# Patient Record
Sex: Male | Born: 1950 | Race: White | Hispanic: No | Marital: Married | Smoking: Never smoker
Health system: Southern US, Community
[De-identification: ages and names within clinical notes are randomized; demographics above are authoritative.]

## PROBLEM LIST (undated history)

## (undated) DIAGNOSIS — I10 Essential (primary) hypertension: Secondary | ICD-10-CM

## (undated) DIAGNOSIS — F32A Depression, unspecified: Secondary | ICD-10-CM

## (undated) DIAGNOSIS — E875 Hyperkalemia: Secondary | ICD-10-CM

## (undated) DIAGNOSIS — J189 Pneumonia, unspecified organism: Secondary | ICD-10-CM

## (undated) DIAGNOSIS — C801 Malignant (primary) neoplasm, unspecified: Secondary | ICD-10-CM

## (undated) DIAGNOSIS — I77810 Thoracic aortic ectasia: Secondary | ICD-10-CM

## (undated) DIAGNOSIS — F0390 Unspecified dementia without behavioral disturbance: Secondary | ICD-10-CM

## (undated) DIAGNOSIS — E785 Hyperlipidemia, unspecified: Secondary | ICD-10-CM

## (undated) DIAGNOSIS — T7840XA Allergy, unspecified, initial encounter: Secondary | ICD-10-CM

## (undated) DIAGNOSIS — F329 Major depressive disorder, single episode, unspecified: Secondary | ICD-10-CM

## (undated) DIAGNOSIS — I509 Heart failure, unspecified: Secondary | ICD-10-CM

## (undated) DIAGNOSIS — G4733 Obstructive sleep apnea (adult) (pediatric): Secondary | ICD-10-CM

## (undated) DIAGNOSIS — I420 Dilated cardiomyopathy: Secondary | ICD-10-CM

## (undated) DIAGNOSIS — N400 Enlarged prostate without lower urinary tract symptoms: Secondary | ICD-10-CM

## (undated) HISTORY — DX: Depression, unspecified: F32.A

## (undated) HISTORY — PX: COLONOSCOPY: SHX174

## (undated) HISTORY — DX: Major depressive disorder, single episode, unspecified: F32.9

## (undated) HISTORY — DX: Malignant (primary) neoplasm, unspecified: C80.1

## (undated) HISTORY — DX: Hyperlipidemia, unspecified: E78.5

## (undated) HISTORY — DX: Thoracic aortic ectasia: I77.810

## (undated) HISTORY — DX: Dilated cardiomyopathy: I42.0

## (undated) HISTORY — DX: Hyperkalemia: E87.5

## (undated) HISTORY — DX: Allergy, unspecified, initial encounter: T78.40XA

## (undated) HISTORY — DX: Obstructive sleep apnea (adult) (pediatric): G47.33

---

## 2006-01-25 ENCOUNTER — Encounter: Admission: RE | Admit: 2006-01-25 | Discharge: 2006-01-25 | Payer: Self-pay | Admitting: Family Medicine

## 2007-03-15 ENCOUNTER — Encounter: Admission: RE | Admit: 2007-03-15 | Discharge: 2007-03-15 | Payer: Self-pay | Admitting: Family Medicine

## 2007-04-09 ENCOUNTER — Ambulatory Visit: Payer: Self-pay | Admitting: Internal Medicine

## 2007-05-10 ENCOUNTER — Encounter: Payer: Self-pay | Admitting: Internal Medicine

## 2007-05-10 DIAGNOSIS — Z85828 Personal history of other malignant neoplasm of skin: Secondary | ICD-10-CM | POA: Insufficient documentation

## 2007-05-10 DIAGNOSIS — F329 Major depressive disorder, single episode, unspecified: Secondary | ICD-10-CM

## 2007-05-10 DIAGNOSIS — J42 Unspecified chronic bronchitis: Secondary | ICD-10-CM | POA: Insufficient documentation

## 2007-05-10 DIAGNOSIS — F1010 Alcohol abuse, uncomplicated: Secondary | ICD-10-CM | POA: Insufficient documentation

## 2007-05-10 DIAGNOSIS — F32A Depression, unspecified: Secondary | ICD-10-CM | POA: Insufficient documentation

## 2007-05-10 DIAGNOSIS — J40 Bronchitis, not specified as acute or chronic: Secondary | ICD-10-CM | POA: Insufficient documentation

## 2007-05-10 DIAGNOSIS — F3130 Bipolar disorder, current episode depressed, mild or moderate severity, unspecified: Secondary | ICD-10-CM | POA: Insufficient documentation

## 2007-05-10 HISTORY — DX: Personal history of other malignant neoplasm of skin: Z85.828

## 2008-03-16 ENCOUNTER — Encounter: Admission: RE | Admit: 2008-03-16 | Discharge: 2008-03-16 | Payer: Self-pay | Admitting: Family Medicine

## 2010-08-14 ENCOUNTER — Encounter (INDEPENDENT_AMBULATORY_CARE_PROVIDER_SITE_OTHER): Payer: Self-pay | Admitting: *Deleted

## 2010-08-22 NOTE — Letter (Signed)
Summary: Pre Visit Letter Revised  [facility]  [address] [place], [place] [address]   Phone: [phone]  Fax: [phone]        08/14/2010 MRN: [id number] Calvin Escobar [address] [place], [place]  [address]             Procedure Date:  September 05, 2010   dir col-Dr Dalene Carrow to the Gastroenterology Division at [facility].    You are scheduled to see a nurse for your pre-procedure visit on August 23, 2010 at 4:30pm on the 3rd floor at [facility], [address]  We ask that you try to arrive at our office 15 minutes prior to your appointment time to allow for check-in.  Please take a minute to review the attached form.  If you answer "Yes" to one or more of the questions on the first page, we ask that you call the person listed at your earliest opportunity.  If you answer "No" to all of the questions, please complete the rest of the form and bring it to your appointment.    Your nurse visit will consist of discussing your medical and surgical history, your immediate family medical history, and your medications.   If you are unable to list all of your medications on the form, please bring the medication bottles to your appointment and we will list them.  We will need to be aware of both prescribed and over the counter drugs.  We will need to know exact dosage information as well.    Please be prepared to read and sign documents such as consent forms, a financial agreement, and acknowledgement forms.  If necessary, and with your consent, a friend or relative is welcome to sit-in on the nurse visit with you.  Please bring your insurance card so that we may make a copy of it.  If your insurance requires a referral to see a specialist, please bring your referral form from your primary care physician.  No co-pay is required for this nurse visit.     If you cannot keep your appointment, please call [phone] to cancel or reschedule prior to your  appointment date.  This allows [place] the opportunity to schedule an appointment for another patient in need of care.    Thank you for choosing [facility] for your medical needs.  We appreciate the opportunity to care for you.  Please visit [place] at our website  to learn more about our practice.  Sincerely, The Gastroenterology Division

## 2010-08-23 ENCOUNTER — Ambulatory Visit (AMBULATORY_SURGERY_CENTER): Payer: Managed Care, Other (non HMO) | Admitting: *Deleted

## 2010-08-23 VITALS — Ht 73.0 in | Wt 297.0 lb

## 2010-08-23 DIAGNOSIS — Z1211 Encounter for screening for malignant neoplasm of colon: Secondary | ICD-10-CM

## 2010-08-23 MED ORDER — PEG-KCL-NACL-NASULF-NA ASC-C 100 G PO SOLR: 1.0000 | Freq: Once | ORAL | Status: DC

## 2010-08-23 MED ORDER — PEG-KCL-NACL-NASULF-NA ASC-C 100 G PO SOLR: 1.0000 | Freq: Once | ORAL | Status: AC

## 2010-08-24 ENCOUNTER — Telehealth: Payer: Self-pay | Admitting: Internal Medicine

## 2010-08-24 MED ORDER — PEG-KCL-NACL-NASULF-NA ASC-C 100 G PO SOLR: 1.0000 | Freq: Once | ORAL | Status: AC

## 2010-08-24 NOTE — Telephone Encounter (Signed)
LEFT MESSAGE FOR "Calvin Escobar" LETTING HIM KNOW THAT HIS MOVIPREP RX WAS RESENT TO HIS PHARMACY.

## 2010-09-05 ENCOUNTER — Other Ambulatory Visit: Payer: Self-pay | Admitting: Internal Medicine

## 2010-09-15 ENCOUNTER — Encounter: Payer: Self-pay | Admitting: Internal Medicine

## 2010-09-15 ENCOUNTER — Ambulatory Visit (AMBULATORY_SURGERY_CENTER): Payer: Managed Care, Other (non HMO) | Admitting: Internal Medicine

## 2010-09-15 VITALS — BP 129/88 | HR 57 | Temp 97.1°F | Resp 19 | Ht 70.0 in | Wt 290.0 lb

## 2010-09-15 DIAGNOSIS — K5730 Diverticulosis of large intestine without perforation or abscess without bleeding: Secondary | ICD-10-CM

## 2010-09-15 DIAGNOSIS — Z1211 Encounter for screening for malignant neoplasm of colon: Secondary | ICD-10-CM

## 2010-09-15 MED ORDER — SODIUM CHLORIDE 0.9 % IV SOLN: 500.0000 mL | INTRAVENOUS | Status: DC

## 2010-09-15 NOTE — Patient Instructions (Signed)
Please read the handouts given to you in the recovery room.    Do increase your fiber intake due to your diverticulosis.   If you have any questions or concerns,  Please call us at [phone].  Thank-you.

## 2010-09-18 ENCOUNTER — Telehealth: Payer: Self-pay | Admitting: *Deleted

## 2010-09-18 NOTE — Telephone Encounter (Signed)
No answer,did not leave message,no i.d. On machine.

## 2010-10-17 NOTE — Letter (Signed)
April 09, 2007    Calvin Escobar, M.D.  [address]  [place], [place] [address]   RE:  Calvin Escobar, Calvin Escobar  MRN:  [id number]  /  DOB:  06/02/1951   Dear Dr. Kathrynn Escobar:   It was a pleasure to see [name] in our pulmonary clinic today.  As you know, he is a very pleasant 60 year old male.  His history is  that he moved to [place] in 2002.  At that time, he had a regular job  at [employer], which is near [employer] in [place].  Preoperative  physical evaluation showed a nodule on the chest x-ray.  He thinks he  was denied a job because of that.  Later, when he regained his  insurance, he saw you in August 2007.  A CT scan of the chest was done  and the right upper lobe nodule was seen (details discussed below).  This was followed up with a CT scan again in October of 2008.  He now  presents here for further assessment and evaluation of the nodule.  Apart from being overweight he feels he is in stable health and is  asymptomatic.   PAST MEDICAL HISTORY:  1. Episodes of bronchitis in the winter, for which he takes      antibiotics.  2. Facial skin cancer, not otherwise specified.  He is being followed      by a dermatologist every year.  3. Depression.  4. Ex-alcoholic.  5. Denies exposure to tuberculosis.   PAST SURGICAL HISTORY:  None.   ALLERGIES:  None.   CURRENT MEDICATIONS:  Doxazosin.  Trazodone.  and another unknown  medicine   SOCIAL HISTORY:  He grew up in [place] and lived there until 1991,  around [place] and [place].  In 1999, he moved to [place],  [place] where he lived until 2001, when he worked for [employer] of the  [employer].  He [place] and was exposed to a lot  of soil dust.  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  He denies  smoking.  Used to drink a lot, but now quit.  He is married and has  children.   EXPOSURE HISTORY:  He is  exposed to the inks at work, but thinks they  are safe.  He was exposed to a lot of soil dust in [place] between  1999 and 2001.   FAMILY HISTORY:  His sister had bone cancer.  Otherwise,  noncontributory.   REVIEW OF SYSTEMS:  Positive for weight gain, anxiety, and depression.  Otherwise, noncontributory.   PHYSICAL EXAM:  Weight 280 pounds, temperature 98.3, blood pressure  126/78, pulse 70, saturation 96% on room air.  GENERAL:  Obese male seated comfortably in the exam room.  CENTRAL NERVOUS SYSTEM:  Alert and oriented x3.  Speech and ambulation  normal.  HEENT:  Mallampati III to IV.  No neck nodes.  Supple neck.  RESPIRATORY:  Air entry equal.  Normal breath sounds.  CARDIOVASCULAR:  Normal heart sounds.  Regular rate and rhythm.  ABDOMEN:  Obese, soft, and nontender.  EXTREMITIES:  No cyanosis, clubbing, or edema.  SKIN:  He has ink from work on his fingers.  BACK:  Normal.  MUSCULOSKELETAL:  No joint deformities.   LABORATORY DATA:  CT scan of the chest on January 22, 2006 performed in  [facility].  I  personally reviewed this film.  It shows right  upper lobe approximately 1 cm nodule with a central calcification and  surrounding soft tissue density.  There are no other abnormalities  noted.   Followup CT scan on March 15, 2007.  I personally reviewed this film  as well.  It shows complete calcification of the nodule, which is 10x6  cm in size on image 22.  In addition, there are several 1 mm nodules  around the central calcification, and also on the anterior segment of  the right upper lobe.  These small micro-nodules are new.   IMPRESSION:  1. Right upper lobe 1 cm calcified nodules.  In August 2007, this      nodule had a central calcification.  By October 2008, the      calcification is complete.  Based on the fact that he is a      nonsmoker, lived in the [place], and the fact that this      is a completely calcified nodule that is  unchanged in size in 1      year, pre-test probability for malignancy is zero on [email or web address]      calculator and also clinically.  Therefore, there is no need to      follow this.   1. New micro-nodules.  I am not sure what these new micro-nodules are.      They could be related to granulomatous disease.  The previous      probability for this being malignant is less than 5%.  Never the      less, it requires a followup.  CT scan of the chest will be      repeated again in November 2009.   Return to clinic in 1 year with CT scan of the chest.    Sincerely,      Calvin Shan, MD  Electronically Signed    MR/MedQ  DD: 04/09/2007  DT: 04/10/2007  Job #: [id number]

## 2012-03-27 DIAGNOSIS — N400 Enlarged prostate without lower urinary tract symptoms: Secondary | ICD-10-CM | POA: Insufficient documentation

## 2012-03-27 DIAGNOSIS — N138 Other obstructive and reflux uropathy: Secondary | ICD-10-CM | POA: Insufficient documentation

## 2012-03-27 DIAGNOSIS — E669 Obesity, unspecified: Secondary | ICD-10-CM | POA: Insufficient documentation

## 2012-03-27 HISTORY — DX: Morbid (severe) obesity due to excess calories: E66.01

## 2012-09-18 DIAGNOSIS — F1021 Alcohol dependence, in remission: Secondary | ICD-10-CM | POA: Insufficient documentation

## 2013-09-12 ENCOUNTER — Emergency Department (HOSPITAL_BASED_OUTPATIENT_CLINIC_OR_DEPARTMENT_OTHER)
Admission: EM | Admit: 2013-09-12 | Discharge: 2013-09-12 | Disposition: A | Discharge status: Home / Self Care | Payer: Managed Care, Other (non HMO) | Attending: Emergency Medicine | Admitting: Emergency Medicine

## 2013-09-12 ENCOUNTER — Encounter (HOSPITAL_BASED_OUTPATIENT_CLINIC_OR_DEPARTMENT_OTHER): Payer: Self-pay | Admitting: Emergency Medicine

## 2013-09-12 ENCOUNTER — Emergency Department (HOSPITAL_BASED_OUTPATIENT_CLINIC_OR_DEPARTMENT_OTHER): Payer: Managed Care, Other (non HMO)

## 2013-09-12 DIAGNOSIS — Z9889 Other specified postprocedural states: Secondary | ICD-10-CM | POA: Insufficient documentation

## 2013-09-12 DIAGNOSIS — R5381 Other malaise: Secondary | ICD-10-CM | POA: Insufficient documentation

## 2013-09-12 DIAGNOSIS — K5900 Constipation, unspecified: Secondary | ICD-10-CM | POA: Insufficient documentation

## 2013-09-12 DIAGNOSIS — R5383 Other fatigue: Secondary | ICD-10-CM

## 2013-09-12 DIAGNOSIS — Z79899 Other long term (current) drug therapy: Secondary | ICD-10-CM | POA: Insufficient documentation

## 2013-09-12 DIAGNOSIS — Z859 Personal history of malignant neoplasm, unspecified: Secondary | ICD-10-CM | POA: Insufficient documentation

## 2013-09-12 DIAGNOSIS — F3289 Other specified depressive episodes: Secondary | ICD-10-CM | POA: Insufficient documentation

## 2013-09-12 DIAGNOSIS — R112 Nausea with vomiting, unspecified: Secondary | ICD-10-CM | POA: Insufficient documentation

## 2013-09-12 DIAGNOSIS — F329 Major depressive disorder, single episode, unspecified: Secondary | ICD-10-CM | POA: Insufficient documentation

## 2013-09-12 DIAGNOSIS — R630 Anorexia: Secondary | ICD-10-CM | POA: Insufficient documentation

## 2013-09-12 DIAGNOSIS — R109 Unspecified abdominal pain: Secondary | ICD-10-CM

## 2013-09-12 DIAGNOSIS — R1084 Generalized abdominal pain: Secondary | ICD-10-CM | POA: Insufficient documentation

## 2013-09-12 DIAGNOSIS — F1010 Alcohol abuse, uncomplicated: Secondary | ICD-10-CM | POA: Insufficient documentation

## 2013-09-12 DIAGNOSIS — E785 Hyperlipidemia, unspecified: Secondary | ICD-10-CM | POA: Insufficient documentation

## 2013-09-12 LAB — URINALYSIS, ROUTINE W REFLEX MICROSCOPIC
Bilirubin Urine: NEGATIVE
Glucose, UA: NEGATIVE mg/dL
Ketones, ur: NEGATIVE mg/dL
Leukocytes, UA: NEGATIVE
Nitrite: NEGATIVE
Protein, ur: 100 mg/dL — AB
Specific Gravity, Urine: 1.021 (ref 1.005–1.030)
Urobilinogen, UA: 0.2 mg/dL (ref 0.0–1.0)
pH: 5.5 (ref 5.0–8.0)

## 2013-09-12 LAB — URINE MICROSCOPIC-ADD ON

## 2013-09-12 LAB — COMPREHENSIVE METABOLIC PANEL
ALT: 18 U/L (ref 0–53)
AST: 24 U/L (ref 0–37)
Albumin: 3.6 g/dL (ref 3.5–5.2)
Alkaline Phosphatase: 64 U/L (ref 39–117)
CO2: 27 mEq/L (ref 19–32)
Calcium: 9.3 mg/dL (ref 8.4–10.5)
GFR calc Af Amer: 72 mL/min — ABNORMAL LOW (ref >90)
GFR calc non Af Amer: 62 mL/min — ABNORMAL LOW (ref >90)
Potassium: 4.1 mEq/L (ref 3.7–5.3)
Sodium: 138 mEq/L (ref 137–147)

## 2013-09-12 LAB — CBC WITH DIFFERENTIAL/PLATELET
Band Neutrophils: 5 % (ref 0–10)
Basophils Absolute: 0 K/uL (ref 0.0–0.1)
Basophils Relative: 0 % (ref 0–1)
Eosinophils Absolute: 0 K/uL (ref 0.0–0.7)
Eosinophils Relative: 0 % (ref 0–5)
HCT: 40.2 % (ref 39.0–52.0)
Hemoglobin: 13.5 g/dL (ref 13.0–17.0)
Lymphocytes Relative: 6 % — ABNORMAL LOW (ref 12–46)
Lymphs Abs: 0.8 K/uL (ref 0.7–4.0)
MCH: 31 pg (ref 26.0–34.0)
MCHC: 33.6 g/dL (ref 30.0–36.0)
MCV: 92.4 fL (ref 78.0–100.0)
Monocytes Absolute: 1 10*3/uL (ref 0.1–1.0)
Monocytes Relative: 8 % (ref 3–12)
Neutro Abs: 10.9 10*3/uL — ABNORMAL HIGH (ref 1.7–7.7)
Neutrophils Relative %: 81 % — ABNORMAL HIGH (ref 43–77)
Platelets: 124 K/uL — ABNORMAL LOW (ref 150–400)
RBC: 4.35 MIL/uL (ref 4.22–5.81)
RDW: 13.1 % (ref 11.5–15.5)
WBC: 12.7 10*3/uL — ABNORMAL HIGH (ref 4.0–10.5)

## 2013-09-12 LAB — COMPREHENSIVE METABOLIC PANEL WITH GFR
BUN: 21 mg/dL (ref 6–23)
Chloride: 97 meq/L (ref 96–112)
Creatinine, Ser: 1.2 mg/dL (ref 0.50–1.35)
Glucose, Bld: 133 mg/dL — ABNORMAL HIGH (ref 70–99)
Total Bilirubin: 0.4 mg/dL (ref 0.3–1.2)
Total Protein: 7.6 g/dL (ref 6.0–8.3)

## 2013-09-12 LAB — LIPASE, BLOOD: Lipase: 27 U/L (ref 11–59)

## 2013-09-12 MED ORDER — OXYCODONE-ACETAMINOPHEN 5-325 MG PO TABS: 1.0000 | ORAL_TABLET | Freq: Four times a day (QID) | ORAL | Status: DC | PRN

## 2013-09-12 MED ORDER — ONDANSETRON HCL 4 MG/2ML IJ SOLN
4.0000 mg | Freq: Once | INTRAMUSCULAR | Status: AC
  Administered 2013-09-12: 4 mg via INTRAVENOUS
  Filled 2013-09-12: qty 2

## 2013-09-12 MED ORDER — FENTANYL CITRATE 0.05 MG/ML IJ SOLN
50.0000 ug | INTRAMUSCULAR | Status: DC | PRN
  Administered 2013-09-12: 50 ug via INTRAVENOUS
  Filled 2013-09-12: qty 2

## 2013-09-12 MED ORDER — ONDANSETRON HCL 4 MG PO TABS: 4.0000 mg | ORAL_TABLET | Freq: Four times a day (QID) | ORAL | Status: DC

## 2013-09-12 MED ORDER — FAMOTIDINE IN NACL 20-0.9 MG/50ML-% IV SOLN
20.0000 mg | Freq: Once | INTRAVENOUS | Status: AC
  Administered 2013-09-12: 20 mg via INTRAVENOUS
  Filled 2013-09-12: qty 50

## 2013-09-12 MED ORDER — IOHEXOL 300 MG/ML  SOLN
50.0000 mL | Freq: Once | INTRAMUSCULAR | Status: AC | PRN
  Administered 2013-09-12: 50 mL via ORAL

## 2013-09-12 MED ORDER — IOHEXOL 300 MG/ML  SOLN
100.0000 mL | Freq: Once | INTRAMUSCULAR | Status: AC | PRN
  Administered 2013-09-12: 100 mL via INTRAVENOUS

## 2013-09-12 NOTE — Discharge Instructions (Signed)
Return to the ED with any concerns including increased pain, vomiting and not able to keep down liquids, fever/chilsl, decreased level of alertness/lethargy, or any other alarming symptoms

## 2013-09-12 NOTE — ED Provider Notes (Signed)
CSN: [id number]     Arrival date & time 09/12/13  1215 History   First MD Initiated Contact with Patient 09/12/13 1310     Chief Complaint  Patient presents with  . Abdominal Pain     (Consider location/radiation/quality/duration/timing/severity/associated sxs/prior Treatment) HPI Comments: Pt has had N/V a couple of days ago initially, none further, but appetite is down, gets easily nasueated with eating or drinking.  Feels fatigued, no fevers, chills.  No dark stools, no blood in stool.  Went to PCP at [facility] and was sent to the ED due to elevated WBC.    Patient is a 63 y.o. male presenting with abdominal pain. The history is provided by the patient.  Abdominal Pain Pain location:  Generalized Pain quality: aching, bloating, fullness and pressure   Pain severity:  Moderate Onset quality:  Gradual Duration:  3 days Progression:  Worsening Chronicity:  New Context: alcohol use   Context: not previous surgeries, not recent illness, not recent travel, not sick contacts and not suspicious food intake   Relieved by:  Nothing Worsened by:  Palpation Ineffective treatments:  None tried Associated symptoms: anorexia, constipation, nausea and vomiting   Associated symptoms: no belching, no chest pain, no chills, no cough, no diarrhea, no fever and no shortness of breath     Past Medical History  Diagnosis Date  . Allergy seasonal  . Depression   . Cancer skin  . Hyperlipidemia    Past Surgical History  Procedure Laterality Date  . Colonoscopy     History reviewed. No pertinent family history. History  Substance Use Topics  . Smoking status: Never Smoker   . Smokeless tobacco: Not on file  . Alcohol Use: 8.4 oz/week    14 Cans of beer per week    Review of Systems  Constitutional: Negative for fever and chills.  Respiratory: Negative for cough and shortness of breath.   Cardiovascular: Negative for chest pain.  Gastrointestinal: Positive for nausea, vomiting,  abdominal pain, constipation and anorexia. Negative for diarrhea and blood in stool.  Genitourinary: Negative for flank pain and testicular pain.  Musculoskeletal: Negative for back pain.  All other systems reviewed and are negative.     Allergies  Review of patient's allergies indicates no known allergies.  Home Medications   Current Outpatient Rx  Name  Route  Sig  Dispense  Refill  . calcium carbonate (OS-CAL) 600 MG TABS   Oral   Take 600 mg by mouth daily.           Marland Kitchen doxazosin (CARDURA) 8 MG tablet   Oral   Take 8 mg by mouth at bedtime. BPH          . fish oil-omega-3 fatty acids 1000 MG capsule   Oral   Take 4 g by mouth daily.           . Multiple Vitamins-Minerals (MULTIVITAMIN WITH MINERALS) tablet   Oral   Take 1 tablet by mouth daily.           Marland Kitchen OLANZapine (ZYPREXA) 5 MG tablet   Oral   Take 5 mg by mouth at bedtime.           . sertraline (ZOLOFT) 100 MG tablet   Oral   Take 100 mg by mouth daily. 2 tablets daily          . Testosterone (AXIRON) 30 MG/ACT SOLN   Transdermal   Place 60 Act onto the skin 2 (two) times a week.           Marland Kitchen  traZODone (DESYREL) 150 MG tablet   Oral   Take 150 mg by mouth at bedtime.           Marland Kitchen zolpidem (AMBIEN) 10 MG tablet   Oral   Take 10 mg by mouth at bedtime as needed.            BP 136/87  Pulse 91  Temp(Src) 98.5 F (36.9 C) (Oral)  Resp 24  SpO2 95% Physical Exam  Nursing note and vitals reviewed. Constitutional: He appears well-developed and well-nourished. No distress.  HENT:  Head: Normocephalic and atraumatic.  Eyes: EOM are normal.  Neck: Normal range of motion. Neck supple.  Cardiovascular: Normal rate and intact distal pulses.   Pulmonary/Chest: Effort normal. No respiratory distress.  Abdominal: Soft. Normal appearance. He exhibits no distension and no mass. Bowel sounds are decreased. There is no tenderness. There is no rebound, no guarding and no CVA tenderness. No  hernia.    Neurological: He is alert. Coordination normal.  Skin: Skin is warm and dry. He is not diaphoretic.  Psychiatric: His behavior is normal. His affect is blunt.    ED Course  Procedures (including critical care time) Labs Review Labs Reviewed  CBC WITH DIFFERENTIAL - Abnormal; Notable for the following:    WBC 12.7 (*)    Platelets 124 (*)    Neutrophils Relative % 81 (*)    Lymphocytes Relative 6 (*)    Neutro Abs 10.9 (*)    All other components within normal limits  COMPREHENSIVE METABOLIC PANEL - Abnormal; Notable for the following:    Glucose, Bld 133 (*)    GFR calc non Af Amer 62 (*)    GFR calc Af Amer 72 (*)    All other components within normal limits  LIPASE, BLOOD  URINALYSIS, ROUTINE W REFLEX MICROSCOPIC   Imaging Review No results found.   EKG Interpretation None     RA sat is 95% and I interpret to be adequate    MDM   Final diagnoses:  Abdominal pain     Pt with persistent lower abd pain associated with N/V.  Abd is mildly tender, but soft.  No h/o prior surgeries.  Will give IVF's, IV antiemetics and pain meds, obtain CT of abd/eplvis.  Will sign out patient to Dr. Canary Brim.   Saddie Benders. Spenser Harren, MD 09/12/13 1513

## 2013-09-12 NOTE — ED Notes (Signed)
Pt having lower abdominal pain since Wednesday.  Pt having some weakness, some nausea, vomiting x one hour on Wednesday.  Last bm was on last night, small amount, formed brown stool, no diarrhea.

## 2014-10-13 DIAGNOSIS — F339 Major depressive disorder, recurrent, unspecified: Secondary | ICD-10-CM | POA: Insufficient documentation

## 2015-01-12 DIAGNOSIS — K279 Peptic ulcer, site unspecified, unspecified as acute or chronic, without hemorrhage or perforation: Secondary | ICD-10-CM

## 2015-01-12 HISTORY — DX: Peptic ulcer, site unspecified, unspecified as acute or chronic, without hemorrhage or perforation: K27.9

## 2016-01-29 ENCOUNTER — Emergency Department (HOSPITAL_COMMUNITY): Payer: Medicare HMO

## 2016-01-29 ENCOUNTER — Inpatient Hospital Stay (HOSPITAL_COMMUNITY)
Admission: EM | Admit: 2016-01-29 | Discharge: 2016-02-03 | DRG: 918 | Disposition: A | Discharge status: Skilled Nursing Facility | Payer: Medicare HMO | Attending: Student in an Organized Health Care Education/Training Program | Admitting: Student in an Organized Health Care Education/Training Program

## 2016-01-29 ENCOUNTER — Encounter (HOSPITAL_COMMUNITY): Payer: Self-pay

## 2016-01-29 DIAGNOSIS — R410 Disorientation, unspecified: Secondary | ICD-10-CM

## 2016-01-29 DIAGNOSIS — G252 Other specified forms of tremor: Secondary | ICD-10-CM

## 2016-01-29 DIAGNOSIS — F32A Depression, unspecified: Secondary | ICD-10-CM | POA: Diagnosis present

## 2016-01-29 DIAGNOSIS — T43591A Poisoning by other antipsychotics and neuroleptics, accidental (unintentional), initial encounter: Principal | ICD-10-CM | POA: Diagnosis present

## 2016-01-29 DIAGNOSIS — T56891A Toxic effect of other metals, accidental (unintentional), initial encounter: Secondary | ICD-10-CM | POA: Diagnosis present

## 2016-01-29 DIAGNOSIS — E876 Hypokalemia: Secondary | ICD-10-CM | POA: Diagnosis present

## 2016-01-29 DIAGNOSIS — F0390 Unspecified dementia without behavioral disturbance: Secondary | ICD-10-CM | POA: Insufficient documentation

## 2016-01-29 DIAGNOSIS — F329 Major depressive disorder, single episode, unspecified: Secondary | ICD-10-CM

## 2016-01-29 DIAGNOSIS — T56894A Toxic effect of other metals, undetermined, initial encounter: Secondary | ICD-10-CM

## 2016-01-29 DIAGNOSIS — E875 Hyperkalemia: Secondary | ICD-10-CM

## 2016-01-29 DIAGNOSIS — R7889 Finding of other specified substances, not normally found in blood: Secondary | ICD-10-CM

## 2016-01-29 DIAGNOSIS — F0391 Unspecified dementia with behavioral disturbance: Secondary | ICD-10-CM | POA: Diagnosis present

## 2016-01-29 DIAGNOSIS — Y92129 Unspecified place in nursing home as the place of occurrence of the external cause: Secondary | ICD-10-CM

## 2016-01-29 DIAGNOSIS — F3130 Bipolar disorder, current episode depressed, mild or moderate severity, unspecified: Secondary | ICD-10-CM | POA: Diagnosis present

## 2016-01-29 DIAGNOSIS — E785 Hyperlipidemia, unspecified: Secondary | ICD-10-CM | POA: Diagnosis present

## 2016-01-29 DIAGNOSIS — N400 Enlarged prostate without lower urinary tract symptoms: Secondary | ICD-10-CM | POA: Diagnosis present

## 2016-01-29 DIAGNOSIS — J302 Other seasonal allergic rhinitis: Secondary | ICD-10-CM | POA: Diagnosis present

## 2016-01-29 DIAGNOSIS — G253 Myoclonus: Secondary | ICD-10-CM | POA: Diagnosis present

## 2016-01-29 HISTORY — DX: Toxic effect of other metals, accidental (unintentional), initial encounter: T56.891A

## 2016-01-29 HISTORY — DX: Unspecified dementia, unspecified severity, without behavioral disturbance, psychotic disturbance, mood disturbance, and anxiety: F03.90

## 2016-01-29 LAB — CBC WITH DIFFERENTIAL/PLATELET
Basophils Absolute: 0 10*3/uL (ref 0.0–0.1)
Basophils Relative: 0 %
Eosinophils Absolute: 0.2 10*3/uL (ref 0.0–0.7)
Eosinophils Relative: 2 %
HEMATOCRIT: 38 % — AB (ref 39.0–52.0)
HEMOGLOBIN: 12.6 g/dL — AB (ref 13.0–17.0)
LYMPHS ABS: 1.9 10*3/uL (ref 0.7–4.0)
Lymphocytes Relative: 15 %
MCH: 30.1 pg (ref 26.0–34.0)
MCHC: 33.2 g/dL (ref 30.0–36.0)
MCV: 90.7 fL (ref 78.0–100.0)
MONOS PCT: 9 %
Monocytes Absolute: 1.1 10*3/uL — ABNORMAL HIGH (ref 0.1–1.0)
NEUTROS ABS: 9.7 10*3/uL — AB (ref 1.7–7.7)
NEUTROS PCT: 74 %
Platelets: 198 10*3/uL (ref 150–400)
RBC: 4.19 MIL/uL — AB (ref 4.22–5.81)
RDW: 12.7 % (ref 11.5–15.5)
WBC: 13 10*3/uL — ABNORMAL HIGH (ref 4.0–10.5)

## 2016-01-29 LAB — URINALYSIS, ROUTINE W REFLEX MICROSCOPIC
Bilirubin Urine: NEGATIVE
Glucose, UA: NEGATIVE mg/dL
Hgb urine dipstick: NEGATIVE
Ketones, ur: NEGATIVE mg/dL
Nitrite: NEGATIVE
PH: 6.5 (ref 5.0–8.0)
Protein, ur: NEGATIVE mg/dL
Specific Gravity, Urine: 1.009 (ref 1.005–1.030)

## 2016-01-29 LAB — TSH: TSH: 2.379 u[IU]/mL (ref 0.350–4.500)

## 2016-01-29 LAB — I-STAT TROPONIN, ED: Troponin i, poc: 0 ng/mL (ref 0.00–0.08)

## 2016-01-29 LAB — BASIC METABOLIC PANEL
Anion gap: 8 (ref 5–15)
BUN: 6 mg/dL (ref 6–20)
CHLORIDE: 100 mmol/L — AB (ref 101–111)
CO2: 24 mmol/L (ref 22–32)
CREATININE: 1.16 mg/dL (ref 0.61–1.24)
Calcium: 9.7 mg/dL (ref 8.9–10.3)
GFR calc Af Amer: >60 mL/min (ref >60)
GFR calc non Af Amer: >60 mL/min (ref >60)
Glucose, Bld: 94 mg/dL (ref 65–99)
POTASSIUM: 3.1 mmol/L — AB (ref 3.5–5.1)
Sodium: 132 mmol/L — ABNORMAL LOW (ref 135–145)

## 2016-01-29 LAB — URINE MICROSCOPIC-ADD ON: Squamous Epithelial / LPF: NONE SEEN

## 2016-01-29 LAB — LITHIUM LEVEL: Lithium Lvl: 2.37 mmol/L (ref 0.60–1.20)

## 2016-01-29 LAB — MAGNESIUM: MAGNESIUM: 2.2 mg/dL (ref 1.7–2.4)

## 2016-01-29 MED ORDER — POTASSIUM CHLORIDE 10 MEQ/100ML IV SOLN
10.0000 meq | INTRAVENOUS | Status: AC
  Administered 2016-01-29 (×4): 10 meq via INTRAVENOUS
  Filled 2016-01-29 (×4): qty 100

## 2016-01-29 MED ORDER — PAROXETINE HCL 20 MG PO TABS
60.0000 mg | ORAL_TABLET | Freq: Every day | ORAL | Status: DC
  Filled 2016-01-29: qty 3

## 2016-01-29 MED ORDER — ENSURE ENLIVE PO LIQD
237.0000 mL | Freq: Two times a day (BID) | ORAL | Status: DC
  Administered 2016-01-29 – 2016-02-03 (×8): 237 mL via ORAL

## 2016-01-29 MED ORDER — SERTRALINE HCL 50 MG PO TABS
200.0000 mg | ORAL_TABLET | Freq: Every day | ORAL | Status: DC
  Administered 2016-01-29: 200 mg via ORAL
  Filled 2016-01-29: qty 4

## 2016-01-29 MED ORDER — PAROXETINE HCL 20 MG PO TABS: 40.0000 mg | ORAL_TABLET | Freq: Every day | ORAL | Status: DC

## 2016-01-29 MED ORDER — KCL IN DEXTROSE-NACL 10-5-0.45 MEQ/L-%-% IV SOLN
INTRAVENOUS | Status: DC
  Administered 2016-01-29 (×2): via INTRAVENOUS
  Administered 2016-01-30: 125 mL/h via INTRAVENOUS
  Administered 2016-01-31 – 2016-02-01 (×4): via INTRAVENOUS
  Filled 2016-01-29 (×10): qty 1000

## 2016-01-29 MED ORDER — CALCIUM CARBONATE 1250 (500 CA) MG PO TABS
1250.0000 mg | ORAL_TABLET | Freq: Every day | ORAL | Status: DC
  Administered 2016-01-31 – 2016-02-03 (×4): 1250 mg via ORAL
  Filled 2016-01-29 (×4): qty 1

## 2016-01-29 MED ORDER — OLANZAPINE 5 MG PO TABS
5.0000 mg | ORAL_TABLET | Freq: Every day | ORAL | Status: DC
  Administered 2016-01-29: 5 mg via ORAL
  Filled 2016-01-29: qty 1

## 2016-01-29 MED ORDER — DOXAZOSIN MESYLATE 8 MG PO TABS
8.0000 mg | ORAL_TABLET | Freq: Every day | ORAL | Status: DC
  Administered 2016-01-29 – 2016-02-03 (×4): 8 mg via ORAL
  Filled 2016-01-29: qty 1
  Filled 2016-01-29 (×2): qty 4
  Filled 2016-01-29 (×2): qty 1
  Filled 2016-01-29 (×2): qty 4
  Filled 2016-01-29 (×3): qty 1

## 2016-01-29 MED ORDER — ENOXAPARIN SODIUM 40 MG/0.4ML ~~LOC~~ SOLN
40.0000 mg | SUBCUTANEOUS | Status: DC
  Administered 2016-01-29 – 2016-02-02 (×4): 40 mg via SUBCUTANEOUS
  Filled 2016-01-29 (×5): qty 0.4

## 2016-01-29 NOTE — H&P (Signed)
Date: 01/29/2016               Patient Name:  Calvin Escobar MRN: [id number]  DOB: Feb 27, 1951 Age / Sex: 65 y.o., male   PCP: Molli Barrows, MD         Medical Service: Internal Medicine Teaching Service         Attending Physician: Dr. Aldine Contes, MD    First Contact: Dr. Alphonzo Grieve Pager: [phone]  Second Contact: Dr. Dellia Nims Pager: [phone]       After Hours (After 5p/  First Contact Pager: [phone]  weekends / holidays): Second Contact Pager: [phone]   Chief Complaint: Worsening confusion  History of Present Illness: Calvin Escobar is a 65yo male with PMH of hyperlipidemia, depression presenting with worsening confusion and tremors. Patient is oriented only to self and is unable to answer questions pertaining to his condition and why he is in the hospital.   Per wife: Patient at baseline is alert and aware of himself and surroundings, and has mild tremors. Has had two days of worsening memory, confusion, and increased tremors to the point of not being stable enough to walk. He had a long history of alcohol abuse, which he stopped one year ago after his friend died. He also has a long history of depression, previously managed with zoloft alone but was taken off of it for an unknown reason to the wife. Wife is unable to specify which mental illness he has or why he is on so many psychiatric medications. She states that he has a lot of trouble sleeping. His psychiatrist increased his lithium dose about one month ago. She states that other than a couple of episodes of diarrhea a couple of days ago, he has been without symptoms or complaints.   Labs on admission: K 3.1 otherwise unremarkable BMP; Mg 2.2; WBL 13, Hgb 12.6, HCT 38.0, Plt 198 with unremarkable differential; Lithium level 2.37; CT head shows no acute infarct or process, evidence of chronic microvascular disease and atrophy. TSH 2.379 wnl.  Meds:  Current Meds  Medication Sig  . ALPRAZolam (XANAX) 0.25 MG tablet  Take 0.25 mg by mouth every morning.   . lithium carbonate 300 MG capsule Take 900 mg by mouth at bedtime.  Marland Kitchen PARoxetine (PAXIL) 40 MG tablet Take 40 mg by mouth at bedtime.  . sertraline (ZOLOFT) 100 MG tablet Take 200 mg by mouth daily.      Allergies: Allergies as of 01/29/2016  . (No Known Allergies)   Past Medical History:  Diagnosis Date  . Allergy seasonal  . Cancer ([place]) skin  . Dementia   . Depression   . Hyperlipidemia     Family History: From chart: none  Social History: From chart: no tobacco, no drug use; past alcohol abuse  Review of Systems: A complete ROS was negative except as per HPI.   Physical Exam: Blood pressure 116/74, pulse 76, temperature 99 F (37.2 C), temperature source Oral, resp. rate 18, height 6' (1.829 m), weight 127 kg (280 lb), SpO2 100 %.  Constitutional: NAD, lying in bed comfortably Neuro: Alert to self, not to situation or time, able to follow simple commands after a couple of repetitions, strength intact bilaterally, CN 2-12 grossly intact (unable to complete full exam). 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 Lithium level of 2.37 (theraputic goal of 0.6-1.2). Likely acute on chronic in setting of recent diarrhea. Kidney function remains at baseline (Cr 1.16). TSH wnl. CT head shows only chronic microvascular changes and atrophy. Will check  RPR, HIV and B12 levels in AM. --Hold lithium --IVF D5 1/2 NS @125  --monitor  Depression: Patient with long history of depression on multiple psychiatric medications and recently started lithium; will hold sedating meds as able (ambien, trazodone, percocet, xanex, Paxil). Per wife, he has not expressed SI/HI recently. --Continue Zoloft 200mg  daily; Olanzapine 5mg  daily.  Hypokalemia: Patient present with K of 3.1 and Magnesium wnl. --monitor --replace with KCl 17mEq IV x4; check AM BMP  Dispo: Admit patient to Observation with expected length of stay less than 2 midnights.  Signed: Alphonzo Grieve, MD 01/29/2016, 1:50 PM  Pager: [phone]

## 2016-01-29 NOTE — ED Notes (Signed)
Report to [facility] RN 6E

## 2016-01-29 NOTE — ED Triage Notes (Signed)
Patient arrived by [facility] from home with increased dementia, confusion per spouse and increased tremors for the past few months. On arrival patient alert but not oriented to place, situation.  Able to report his name and birth date. Denies trauma, no signs of trauma noted on assessment. Denies pain

## 2016-01-29 NOTE — ED Notes (Signed)
MD at bedside. 

## 2016-01-29 NOTE — ED Provider Notes (Signed)
Patient presently alert and in no distress. Complains of general malaise but states he is hungry. Patient appears tremulous, chronically ill-appearing   Calvin Dakin, MD 01/29/16 1302

## 2016-01-29 NOTE — ED Provider Notes (Signed)
[facility] DEPT Provider Note   CSN: [id number] Arrival date & time: 01/29/16  1025     History   Chief Complaint Chief Complaint  Patient presents with  . Tremors    HPI Calvin Escobar is a 65 y.o. male.  The history is provided by the patient and medical records.    Level V caveat: Dementia Patient is a 65 year old male with history of seasonal allergies, dementia, depression, hyperlipidemia, BPH, chronic tremors, mood disorder, presenting to the ED for possible change in mental status with increased tremors. At baseline patient is oriented to self. He lives at home with his wife who generally cares for him. She reports that he seems to have increase in tremors from his baseline and seems more confused than normal. Patient has not had any recent falls or trauma. He has not had any fever or other illness. No known changes in medications recently. Patient states overall he feels well.  Past Medical History:  Diagnosis Date  . Allergy seasonal  . Cancer ([place]) skin  . Dementia   . Depression   . Hyperlipidemia     Patient Active Problem List   Diagnosis Date Noted  . 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 DEPRESSION 05/10/2007  . BRONCHITIS 05/10/2007  . SKIN CANCER, HX OF 05/10/2007    Past Surgical History:  Procedure Laterality Date  . COLONOSCOPY         Home Medications    Prior to Admission medications   Medication Sig Start Date End Date Taking? Authorizing Provider  calcium carbonate (OS-CAL) 600 MG TABS Take 600 mg by mouth daily.      Historical Provider, MD  doxazosin (CARDURA) 8 MG tablet Take 8 mg by mouth at bedtime. BPH     Historical Provider, MD  fish oil-omega-3 fatty acids 1000 MG capsule Take 4 g by mouth daily.      Historical Provider, MD  Multiple Vitamins-Minerals (MULTIVITAMIN WITH MINERALS) tablet Take 1 tablet by mouth daily.      Historical Provider, MD  OLANZapine (ZYPREXA) 5 MG tablet Take 5 mg by mouth at bedtime.      Historical Provider, MD   ondansetron (ZOFRAN) 4 MG tablet Take 1 tablet (4 mg total) by mouth every 6 (six) hours. 09/12/13   Alfonzo Beers, MD  oxyCODONE-acetaminophen (PERCOCET/ROXICET) 5-325 MG per tablet Take 1-2 tablets by mouth every 6 (six) hours as needed for severe pain. 09/12/13   Alfonzo Beers, MD  sertraline (ZOLOFT) 100 MG tablet Take 100 mg by mouth daily. 2 tablets daily     Historical Provider, MD  Testosterone (AXIRON) 30 MG/ACT SOLN Place 60 Act onto the skin 2 (two) times a week.      Historical Provider, MD  traZODone (DESYREL) 150 MG tablet Take 150 mg by mouth at bedtime.      Historical Provider, MD  zolpidem (AMBIEN) 10 MG tablet Take 10 mg by mouth at bedtime as needed.      Historical Provider, MD    Family History No family history on file.  Social History Social History  Substance Use Topics  . Smoking status: Never Smoker  . Smokeless tobacco: Never Used  . Alcohol use 8.4 oz/week    14 Cans of beer per week     Allergies   Review of patient's allergies indicates no known allergies.   Review of Systems Review of Systems  Unable to perform ROS: Dementia     Physical Exam Updated Vital Signs BP 153/79   Pulse  71   Temp 99 F (37.2 C) (Oral)   Resp 18   Ht 6' (1.829 m)   Wt 127 kg   SpO2 94%   BMI 37.97 kg/m   Physical Exam  Constitutional: He appears well-developed and well-nourished.  HENT:  Head: Normocephalic and atraumatic.  Mouth/Throat: Oropharynx is clear and moist.  Dried food noted around mouth, membranes mildly dry  Eyes: Conjunctivae and EOM are normal. Pupils are equal, round, and reactive to light.  Neck: Normal range of motion and full passive range of motion without pain. Neck supple. No neck rigidity.  Cardiovascular: Normal rate, regular rhythm and normal heart sounds.   Pulmonary/Chest: Effort normal and breath sounds normal.  Abdominal: Soft. Bowel sounds are normal.  Musculoskeletal: Normal range of motion.  Pelvis stable, non-tender, no  leg shortening  Neurological: He is alert.  Alert, oriented to self; able to answer most questions and follow some commands; no tremors noted in the legs, tremors noted of the hands with outstretched arms  Skin: Skin is warm and dry.  Psychiatric: He has a normal mood and affect.  Nursing note and vitals reviewed.    ED Treatments / Results  Labs (all labs ordered are listed, but only abnormal results are displayed) Labs Reviewed  CBC WITH DIFFERENTIAL/PLATELET - Abnormal; Notable for the following:       Result Value   WBC 13.0 (*)    RBC 4.19 (*)    Hemoglobin 12.6 (*)    HCT 38.0 (*)    Neutro Abs 9.7 (*)    Monocytes Absolute 1.1 (*)    All other components within normal limits  BASIC METABOLIC PANEL - Abnormal; Notable for the following:    Sodium 132 (*)    Potassium 3.1 (*)    Chloride 100 (*)    All other components within normal limits  LITHIUM LEVEL - Abnormal; Notable for the following:    Lithium Lvl 2.37 (*)    All other components within normal limits  URINALYSIS, ROUTINE W REFLEX MICROSCOPIC (NOT AT [facility])  TSH  MAGNESIUM  I-STAT TROPOININ, ED    EKG  EKG Interpretation  Date/Time:  Sunday January 29 2016 10:35:59 EDT Ventricular Rate:  69 PR Interval:    QRS Duration: 132 QT Interval:  426 QTC Calculation: 457 R Axis:   -44 Text Interpretation:  Sinus rhythm Nonspecific IVCD with LAD Left ventricular hypertrophy ST elevation, consider inferior injury Baseline wander in lead(s) V1 No old tracing to compare Confirmed by Winfred Leeds  MD, SAM [phone]) on 01/29/2016 11:04:07 AM       Radiology Dg Chest 2 View  Result Date: 01/29/2016 CLINICAL DATA:  Change in mental status. EXAM: CHEST  2 VIEW COMPARISON:  Chest CT 03/16/2008 FINDINGS: Low lung volumes. Calcified granuloma in the right mid lung. Heart is borderline in size, accentuated by the low volumes. Left base atelectasis. No visible effusions. No acute bony abnormality. IMPRESSION: Low lung volumes.   Left base atelectasis. Borderline heart size. Electronically Signed   By: Rolm Baptise M.D.   On: 01/29/2016 11:59   Ct Head Wo Contrast  Result Date: 01/29/2016 CLINICAL DATA:  Increased dementia.  Confusion and tremors. EXAM: CT HEAD WITHOUT CONTRAST TECHNIQUE: Contiguous axial images were obtained from the base of the skull through the vertex without intravenous contrast. COMPARISON:  None. FINDINGS: Brain: There is prominence of the sulci and ventricles compatible with age related brain atrophy. Mild diffuse low attenuation within the subcortical and periventricular white matter  is identified. There is no abnormal extra-axial fluid collection, intracranial hemorrhage or mass noted. Vascular: No hyperdense vessel or unexpected calcification. Skull: The osseous skull is intact. Sinuses/Orbits: The paranasal sinuses appear clear. The mastoid air cells are clear. The calvarium appears intact. Other: None IMPRESSION: No acute intracranial abnormalities. Chronic microvascular disease and brain atrophy. Electronically Signed   By: Kerby Moors M.D.   On: 01/29/2016 12:34    Procedures Procedures (including critical care time)  Medications Ordered in ED Medications - No data to display   Initial Impression / Assessment and Plan / ED Course  I have reviewed the triage vital signs and the nursing notes.  Pertinent labs & imaging results that were available during my care of the patient were reviewed by me and considered in my medical decision making (see chart for details).  Clinical Course   65 y.o. M here with increased confusion and tremors.  Hx of this at baseline but worse than normal.  No recent fever, chills, or other illness.  No falls or syncope.  VSS here.  EKG is non-ischemic.  Patient is on lithium, question of toxicity?  Wife not here in ED for additional information, will try to speak with her.  CT head, CXR, and labs pending.  Patient's lithium level is high at 2.37. He does have a  mild hypokalemia at 3.1 as well. Chest x-ray and CT head negative for acute findings. Suspect his symptoms are due to lithium toxicity. I have spoken with patient's wife-- lithium dose recently increased by patient's psychiatrist and sleep  medications have been discontinued due to morning somnolence. Suspect this is the etiology of his toxicity. Patient will need admission for close monitoring.  Final Clinical Impressions(s) / ED Diagnoses   Final diagnoses:  Lithium toxicity, undetermined intent, initial encounter  Hypokalemia  Dementia, with behavioral disturbance  Hyperlipidemia    New Prescriptions New Prescriptions   No medications on file     Larene Pickett, PA-C 01/29/16 [place], MD 01/29/16 1701

## 2016-01-29 NOTE — ED Notes (Signed)
Lattie Haw PA at [facility]

## 2016-01-30 DIAGNOSIS — N400 Enlarged prostate without lower urinary tract symptoms: Secondary | ICD-10-CM | POA: Diagnosis present

## 2016-01-30 DIAGNOSIS — F0390 Unspecified dementia without behavioral disturbance: Secondary | ICD-10-CM

## 2016-01-30 DIAGNOSIS — G252 Other specified forms of tremor: Secondary | ICD-10-CM | POA: Diagnosis present

## 2016-01-30 DIAGNOSIS — R251 Tremor, unspecified: Secondary | ICD-10-CM | POA: Diagnosis not present

## 2016-01-30 DIAGNOSIS — E876 Hypokalemia: Secondary | ICD-10-CM | POA: Diagnosis present

## 2016-01-30 DIAGNOSIS — J302 Other seasonal allergic rhinitis: Secondary | ICD-10-CM | POA: Diagnosis present

## 2016-01-30 DIAGNOSIS — T56891D Toxic effect of other metals, accidental (unintentional), subsequent encounter: Secondary | ICD-10-CM | POA: Diagnosis not present

## 2016-01-30 DIAGNOSIS — R410 Disorientation, unspecified: Secondary | ICD-10-CM | POA: Diagnosis not present

## 2016-01-30 DIAGNOSIS — F0391 Unspecified dementia with behavioral disturbance: Secondary | ICD-10-CM | POA: Diagnosis present

## 2016-01-30 DIAGNOSIS — E785 Hyperlipidemia, unspecified: Secondary | ICD-10-CM | POA: Diagnosis present

## 2016-01-30 DIAGNOSIS — Y92129 Unspecified place in nursing home as the place of occurrence of the external cause: Secondary | ICD-10-CM | POA: Diagnosis not present

## 2016-01-30 DIAGNOSIS — T43595A Adverse effect of other antipsychotics and neuroleptics, initial encounter: Secondary | ICD-10-CM | POA: Diagnosis not present

## 2016-01-30 DIAGNOSIS — R7889 Finding of other specified substances, not normally found in blood: Secondary | ICD-10-CM | POA: Diagnosis not present

## 2016-01-30 DIAGNOSIS — G253 Myoclonus: Secondary | ICD-10-CM | POA: Diagnosis present

## 2016-01-30 DIAGNOSIS — T43591A Poisoning by other antipsychotics and neuroleptics, accidental (unintentional), initial encounter: Secondary | ICD-10-CM | POA: Diagnosis present

## 2016-01-30 DIAGNOSIS — F329 Major depressive disorder, single episode, unspecified: Secondary | ICD-10-CM | POA: Diagnosis present

## 2016-01-30 LAB — RPR: RPR Ser Ql: NONREACTIVE

## 2016-01-30 LAB — BASIC METABOLIC PANEL
Anion gap: 6 (ref 5–15)
BUN: 5 mg/dL — AB (ref 6–20)
CO2: 29 mmol/L (ref 22–32)
CREATININE: 1.09 mg/dL (ref 0.61–1.24)
Calcium: 9.5 mg/dL (ref 8.9–10.3)
Chloride: 99 mmol/L — ABNORMAL LOW (ref 101–111)
GFR calc Af Amer: >60 mL/min (ref >60)
GLUCOSE: 107 mg/dL — AB (ref 65–99)
POTASSIUM: 3.7 mmol/L (ref 3.5–5.1)
SODIUM: 134 mmol/L — AB (ref 135–145)

## 2016-01-30 LAB — VITAMIN B12: VITAMIN B 12: 367 pg/mL (ref 180–914)

## 2016-01-30 LAB — CBC
HEMATOCRIT: 36.6 % — AB (ref 39.0–52.0)
Hemoglobin: 12 g/dL — ABNORMAL LOW (ref 13.0–17.0)
MCH: 29.7 pg (ref 26.0–34.0)
MCHC: 32.8 g/dL (ref 30.0–36.0)
MCV: 90.6 fL (ref 78.0–100.0)
PLATELETS: 197 10*3/uL (ref 150–400)
RBC: 4.04 MIL/uL — ABNORMAL LOW (ref 4.22–5.81)
RDW: 12.6 % (ref 11.5–15.5)
WBC: 11.6 10*3/uL — ABNORMAL HIGH (ref 4.0–10.5)

## 2016-01-30 LAB — HIV ANTIBODY (ROUTINE TESTING W REFLEX): HIV Screen 4th Generation wRfx: NONREACTIVE

## 2016-01-30 LAB — LITHIUM LEVEL
LITHIUM LVL: 1.89 mmol/L — AB (ref 0.60–1.20)
LITHIUM LVL: 1.73 mmol/L — AB (ref 0.60–1.20)

## 2016-01-30 MED ORDER — PAROXETINE HCL 20 MG PO TABS: 30.0000 mg | ORAL_TABLET | Freq: Every day | ORAL | Status: DC

## 2016-01-30 MED ORDER — PAROXETINE HCL 20 MG PO TABS: 40.0000 mg | ORAL_TABLET | Freq: Every day | ORAL | Status: DC

## 2016-01-30 NOTE — Evaluation (Signed)
Physical Therapy Evaluation Patient Details Name: Calvin Escobar MRN: [id number] DOB: 09/20/1950 Today's Date: 01/30/2016   History of Present Illness   Mr. Crofoot is a 65yo male with PMH of hyperlipidemia, depression presenting with worsening confusion and tremors. Lithium Toxicity  Clinical Impression  Pt admitted with above diagnosis. Pt currently with functional limitations due to the deficits listed below (see PT Problem List). DC planning will depend on his progress and available assist at home; Presents with jerk-like motions during gait and occasional knee-buckling;  Pt will benefit from skilled PT to increase their independence and safety with mobility to allow discharge to the venue listed below.       Follow Up Recommendations SNF;Other (comment) (depends on progress and home situation; SNF if slow progress)    Equipment Recommendations  Rolling walker with 5" wheels;3in1 (PT)    Recommendations for Other Services OT consult     Precautions / Restrictions Precautions Precautions: Fall      Mobility  Bed Mobility               General bed mobility comments: Initiated session with pt in chair  Transfers Overall transfer level: Needs assistance Equipment used: Rolling walker (2 wheeled) Transfers: Sit to/from Stand Sit to Stand: Min assist         General transfer comment: Min assist to steady; Stood twice from recliner; heavy dependence on UE push  Ambulation/Gait Ambulation/Gait assistance: Mod assist;+2 safety/equipment Ambulation Distance (Feet): 80 Feet Assistive device: Rolling walker (2 wheeled) Gait Pattern/deviations: Step-through pattern;Decreased step length - right;Decreased step length - left;Decreased stride length;Shuffle   Gait velocity interpretation: Below normal speed for age/gender General Gait Details: Frequent jerking motion with occasional bil knee buckling, which increased with increased distance walked and fatigue  Stairs             Wheelchair Mobility    Modified Rankin (Stroke Patients Only)       Balance                                             Pertinent Vitals/Pain Pain Assessment: No/denies pain    Home Living Family/patient expects to be discharged to:: Private residence Living Arrangements: Spouse/significant other;Children Available Help at Discharge: Family;Available PRN/intermittently Type of Home: House           Additional Comments: Mr. Speller was unable to answer questions re: home assist and setup on PT eval    Prior Function           Comments: Will need more information re: baseline prior level of function     Hand Dominance        Extremity/Trunk Assessment   Upper Extremity Assessment:  (Grossly bradykinetic)           Lower Extremity Assessment: Generalized weakness (Grossly bradykinetic, with frequent jerking)         Communication   Communication: Expressive difficulties (Extremely slow to answer questions, answered approx 40% of queries)  Cognition Arousal/Alertness: Awake/alert Behavior During Therapy: Flat affect Overall Cognitive Status: Impaired/Different from baseline (though I am unsure of baseline) Area of Impairment: Safety/judgement;Problem solving         Safety/Judgement: Decreased awareness of safety;Decreased awareness of deficits   Problem Solving: Slow processing;Decreased initiation      General Comments      Exercises        Assessment/Plan  PT Assessment Patient needs continued PT services  PT Diagnosis Difficulty walking;Generalized weakness   PT Problem List Decreased strength;Decreased activity tolerance;Decreased balance;Decreased mobility;Decreased coordination;Decreased cognition;Decreased knowledge of use of DME;Decreased safety awareness;Decreased knowledge of precautions  PT Treatment Interventions DME instruction;Gait training;Stair training;Functional mobility training;Therapeutic  activities;Therapeutic exercise;Balance training;Cognitive remediation;Neuromuscular re-education;Patient/family education   PT Goals (Current goals can be found in the Care Plan section) Acute Rehab PT Goals Patient Stated Goal: did not state PT Goal Formulation: With patient Time For Goal Achievement: 02/13/16 Potential to Achieve Goals: Good    Frequency Min 3X/week   Barriers to discharge   Need more info re: home situation and available assist    Co-evaluation               End of Session Equipment Utilized During Treatment: Gait belt Activity Tolerance: Patient tolerated treatment well Patient left: in chair;with call bell/phone within reach;with chair alarm set (Sitting at nurse's station) Nurse Communication: Mobility status    Functional Assessment Tool Used: Clinical Judgement Functional Limitation: Mobility: Walking and moving around Mobility: Walking and Moving Around Current Status [phone]): At least 40 percent but less than 60 percent impaired, limited or restricted Mobility: Walking and Moving Around Goal Status [phone]): At least 1 percent but less than 20 percent impaired, limited or restricted    Time: 1005-1022 PT Time Calculation (min) (ACUTE ONLY): 17 min   Charges:   PT Evaluation $PT Eval Moderate Complexity: 1 Procedure     PT G Codes:   PT G-Codes **NOT FOR INPATIENT CLASS** Functional Assessment Tool Used: Clinical Judgement Functional Limitation: Mobility: Walking and moving around Mobility: Walking and Moving Around Current Status [phone]): At least 40 percent but less than 60 percent impaired, limited or restricted Mobility: Walking and Moving Around Goal Status [phone]): At least 1 percent but less than 20 percent impaired, limited or restricted    [name] [facility] 01/30/2016, 11:53 AM  [name], [place] Pager [phone] Office [phone]

## 2016-01-30 NOTE — Progress Notes (Signed)
Patient pulled out IV while trying to go to restroom.  MD made aware and OK with no access. Pt resting at desk with staff.  Will continue to monitor. Payton Emerald, RN

## 2016-01-30 NOTE — Progress Notes (Signed)
   Subjective: Patient is without complaints this morning. He is able to say his name and say he has no complaints.   Per nursing, he has pulled out 4 IV lines already; he has been transferred to sit behind nursing desk.  Per psych outpatient office, his current meds are: Lithium 900mg  qhs, trazadone 200mg  qhs, Paxil 30mg  twice daily, Ambien and Xanex 0.025. NOT on zyprexa or sertraline as recorded earlier.  Objective:  Vital signs in last 24 hours: Vitals:   01/29/16 2034 01/30/16 0427 01/30/16 0900 01/30/16 0956  BP: 115/73 (!) 120/91  103/67  Pulse: 71 85  81  Resp: 16 16    Temp: 98.2 F (36.8 C) 98.9 F (37.2 C)    TempSrc: Oral Oral    SpO2: 96% 97% 97% 99%  Weight: 108.6 kg (239 lb 6.7 oz)     Height:       Constitutional: NAD, sitting in chair comfortably, eating breakfast Neuro: Alert to self only, able to follow simple commands, strength intact bilaterally, CN 2-12 grossly intact, grasping reflex decreased, resting and intention tremors much improved but still mildly present CV: RRR, no murmurs, rubs or gallops appreciated Resp: CTAB, no increased work of breathing, no wheezing or crackles appreciated Abd: soft, +BS, NDNT Ext: 2+ pulses throughout, no edema, rash or lesions appreciated  Assessment/Plan:  Principal Problem:   Lithium toxicity Active Problems:   Depression   Confusion   Hyperlipidemia   Hypokalemia  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 Lithium level of 2.37 (theraputic goal of 0.6-1.2). Likely acute on chronic in setting of recent diarrhea. Kidney function remains at baseline (Cr 1.16) >1.09. TSH wnl. CT head shows only chronic microvascular changes and atrophy. Will check RPR, HIV and B12 levels in AM. --Hold lithium --Vit B12 wnl; RPR and HIV ab pending --IVF D5 1/2 NS @125  discontinued due to not being tolerated by  patient --monitor --PT/OT consult  Depression: Patient with long history of depression on multiple psychiatric medications and recently started lithium; will hold sedating meds as able (ambien, trazodone, percocet, xanex). Per wife, he has not expressed SI/HI recently. --Paxil 30mg  daily --Vit B12 wnl; RPR and HIV ab pending  Hypokalemia: Patient present with K of 3.1 and Magnesium wnl. Repleted with IV KCl. Resolved; K 3.7. --monitor  Dispo: Anticipated discharge in approximately 1-2 day(s).   Alphonzo Grieve, MD 01/30/2016, 10:43 AM Pager: [phone]

## 2016-01-30 NOTE — Consult Note (Signed)
[facility] Consult   Reason for Consult:  Lithium toxicity   Referring Physician:  Dr. Andria Frames  Patient Identification: Calvin Escobar MRN:  [id number] Principal Diagnosis: Lithium toxicity Diagnosis:   Patient Active Problem List   Diagnosis Date Noted  . Lithium toxicity [T56.891A] 01/29/2016  . Confusion [R41.0] 01/29/2016  . Dementia [F03.90]   . Hyperlipidemia [E78.5]   . Hypokalemia [E87.6]   . 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 Depression [F32.9] 05/10/2007  . BRONCHITIS [J40] 05/10/2007  . SKIN CANCER, HX OF [Z85.828] 05/10/2007    Total Time spent with patient: 30 minutes  Subjective:   Calvin Escobar is a 65 y.o. male patient admitted with worsening tremors and confusion  .  HPI:  9 yearn old married male . Presented to ED on 8/27, due to worsening mental status, confusion, and increasing tremors . He has a history of dementia, but  Presented with acute worsening of baseline mental status . Patient was found to have an elevated lithium serum level ( 2.37) . Report is that he had been prescribed lithium by outpatient provider and that dose had been increased recently . Patient presents significantly  Confused ( oriented to person only )  , and is able to provide only limited information at this time.  He denies any suicidal ideations, or any recent attempt to overdose.  Chart notes indicate recent diarrhea, which could , by contributing to volume depletion, dehydration, increase lithium serum level. He minimizes depression at this time . Patient presents with distal  tremors and with occasional myoclonic type  Movements- does not appear to be in any acute distress . Ambulation not evaluated, at this time patient in [facility], and unable to ambulate .      Past Psychiatric History: patient unable to provide information - as per chart has history of depression, has been managed with zoloft, lithium, which had been recently increased .As per chart patient has  history of alcohol abuse. Does not endorse any recent alcohol consumption , and as per chart he stopped drinking a year ago  Risk to Self: Is patient at risk for suicide?: No Risk to Others:   Prior Inpatient Therapy:   Prior Outpatient Therapy:    Past Medical History:  Past Medical History:  Diagnosis Date  . Allergy seasonal  . Cancer ([place]) skin  . Dementia   . Depression   . Hyperlipidemia     Past Surgical History:  Procedure Laterality Date  . COLONOSCOPY     Family History: No family history on file. Family Psychiatric  History:unknown at this time  Social History:  History  Alcohol Use  . 8.4 oz/week  . 14 Cans of beer per week     History  Drug Use No    Social History   Social History  . Marital status: Married    Spouse name: N/A  . Number of children: N/A  . Years of education: N/A   Social History Main Topics  . Smoking status: Never Smoker  . Smokeless tobacco: Never Used  . Alcohol use 8.4 oz/week    14 Cans of beer per week  . Drug use: No  . Sexual activity: Not Asked   Other Topics Concern  . None   Social History Narrative  . None   Additional Social History:    Allergies:  No Known Allergies  Labs:  Results for orders placed or performed during the hospital encounter of 01/29/16 (from the past 48 hour(s))  CBC with Differential     Status: Abnormal   Collection Time: 01/29/16 11:00 AM  Result Value Ref Range   WBC 13.0 (H) 4.0 - 10.5 K/uL   RBC 4.19 (L) 4.22 - 5.81 MIL/uL   Hemoglobin 12.6 (L) 13.0 - 17.0 g/dL   HCT 38.0 (L) 39.0 - 52.0 %   MCV 90.7 78.0 - 100.0 fL   MCH 30.1 26.0 - 34.0 pg   MCHC 33.2 30.0 - 36.0 g/dL   RDW 12.7 11.5 - 15.5 %   Platelets 198 150 - 400 K/uL   Neutrophils Relative % 74 %   Neutro Abs 9.7 (H) 1.7 - 7.7 K/uL   Lymphocytes Relative 15 %   Lymphs Abs 1.9 0.7 - 4.0 K/uL   Monocytes Relative 9 %   Monocytes Absolute 1.1 (H) 0.1 - 1.0 K/uL   Eosinophils Relative 2 %   Eosinophils Absolute 0.2  0.0 - 0.7 K/uL   Basophils Relative 0 %   Basophils Absolute 0.0 0.0 - 0.1 K/uL  Basic metabolic panel     Status: Abnormal   Collection Time: 01/29/16 11:00 AM  Result Value Ref Range   Sodium 132 (L) 135 - 145 mmol/L   Potassium 3.1 (L) 3.5 - 5.1 mmol/L   Chloride 100 (L) 101 - 111 mmol/L   CO2 24 22 - 32 mmol/L   Glucose, Bld 94 65 - 99 mg/dL   BUN 6 6 - 20 mg/dL   Creatinine, Ser 1.16 0.61 - 1.24 mg/dL   Calcium 9.7 8.9 - 10.3 mg/dL   GFR calc non Af Amer >60 >60 mL/min   GFR calc Af Amer >60 >60 mL/min    Comment: (NOTE) The eGFR has been calculated using the CKD EPI equation. This calculation has not been validated in all clinical situations. eGFR's persistently <60 mL/min signify possible Chronic Kidney Disease.    Anion gap 8 5 - 15  Lithium level     Status: Abnormal   Collection Time: 01/29/16 11:00 AM  Result Value Ref Range   Lithium Lvl 2.37 (HH) 0.60 - 1.20 mmol/L    Comment: CRITICAL RESULT CALLED TO, READ BACK BY AND VERIFIED WITH: C ROWE,RN 1155 01/29/16 D BRADLEY   I-stat troponin, ED     Status: None   Collection Time: 01/29/16 11:08 AM  Result Value Ref Range   Troponin i, poc 0.00 0.00 - 0.08 ng/mL   Comment 3            Comment: Due to the release kinetics of cTnI, a negative result within the first hours of the onset of symptoms does not rule out myocardial infarction with certainty. If myocardial infarction is still suspected, repeat the test at appropriate intervals.   TSH     Status: None   Collection Time: 01/29/16  1:56 PM  Result Value Ref Range   TSH 2.379 0.350 - 4.500 uIU/mL  Magnesium     Status: None   Collection Time: 01/29/16  1:56 PM  Result Value Ref Range   Magnesium 2.2 1.7 - 2.4 mg/dL  Urinalysis, Routine w reflex microscopic (not at [facility])     Status: Abnormal   Collection Time: 01/29/16  5:12 PM  Result Value Ref Range   Color, Urine YELLOW YELLOW   APPearance CLEAR CLEAR   Specific Gravity, Urine 1.009 1.005 - 1.030    pH 6.5 5.0 - 8.0   Glucose, UA NEGATIVE NEGATIVE mg/dL   Hgb urine dipstick NEGATIVE NEGATIVE   Bilirubin  Urine NEGATIVE NEGATIVE   Ketones, ur NEGATIVE NEGATIVE mg/dL   Protein, ur NEGATIVE NEGATIVE mg/dL   Nitrite NEGATIVE NEGATIVE   Leukocytes, UA TRACE (A) NEGATIVE  Urine microscopic-add on     Status: Abnormal   Collection Time: 01/29/16  5:12 PM  Result Value Ref Range   Squamous Epithelial / LPF NONE SEEN NONE SEEN   WBC, UA 6-30 0 - 5 WBC/hpf   RBC / HPF 0-5 0 - 5 RBC/hpf   Bacteria, UA RARE (A) NONE SEEN   Casts HYALINE CASTS (A) NEGATIVE  Basic metabolic panel     Status: Abnormal   Collection Time: 01/30/16  5:40 AM  Result Value Ref Range   Sodium 134 (L) 135 - 145 mmol/L   Potassium 3.7 3.5 - 5.1 mmol/L   Chloride 99 (L) 101 - 111 mmol/L   CO2 29 22 - 32 mmol/L   Glucose, Bld 107 (H) 65 - 99 mg/dL   BUN 5 (L) 6 - 20 mg/dL   Creatinine, Ser 1.09 0.61 - 1.24 mg/dL   Calcium 9.5 8.9 - 10.3 mg/dL   GFR calc non Af Amer >60 >60 mL/min   GFR calc Af Amer >60 >60 mL/min    Comment: (NOTE) The eGFR has been calculated using the CKD EPI equation. This calculation has not been validated in all clinical situations. eGFR's persistently <60 mL/min signify possible Chronic Kidney Disease.    Anion gap 6 5 - 15  CBC     Status: Abnormal   Collection Time: 01/30/16  5:40 AM  Result Value Ref Range   WBC 11.6 (H) 4.0 - 10.5 K/uL   RBC 4.04 (L) 4.22 - 5.81 MIL/uL   Hemoglobin 12.0 (L) 13.0 - 17.0 g/dL   HCT 36.6 (L) 39.0 - 52.0 %   MCV 90.6 78.0 - 100.0 fL   MCH 29.7 26.0 - 34.0 pg   MCHC 32.8 30.0 - 36.0 g/dL   RDW 12.6 11.5 - 15.5 %   Platelets 197 150 - 400 K/uL  Vitamin B12     Status: None   Collection Time: 01/30/16  5:40 AM  Result Value Ref Range   Vitamin B-12 367 180 - 914 pg/mL    Comment: (NOTE) This assay is not validated for testing neonatal or myeloproliferative syndrome specimens for Vitamin B12 levels.   HIV antibody     Status: None   Collection  Time: 01/30/16  5:40 AM  Result Value Ref Range   HIV Screen 4th Generation wRfx Non Reactive Non Reactive    Comment: (NOTE) Performed At: [facility] [place], [place] [id number] Lindon Romp MD WG:[id number]     Current Facility-Administered Medications  Medication Dose Route Frequency Provider Last Rate Last Dose  . calcium carbonate (OS-CAL - dosed in mg of elemental calcium) tablet 1,250 mg  1,250 mg Oral Q breakfast Tasrif Ahmed, MD      . dextrose 5 % and 0.45 % NaCl with KCl 10 mEq/L infusion   Intravenous Continuous Tasrif Ahmed, MD 125 mL/hr at 01/29/16 2030    . doxazosin (CARDURA) tablet 8 mg  8 mg Oral QHS Tasrif Ahmed, MD   8 mg at 01/29/16 2351  . enoxaparin (LOVENOX) injection 40 mg  40 mg Subcutaneous Q24H Tasrif Ahmed, MD   40 mg at 01/29/16 1716  . feeding supplement (ENSURE ENLIVE) (ENSURE ENLIVE) liquid 237 mL  237 mL Oral BID BM Nischal Narendra, MD   237 mL at 01/29/16 1712  .  PARoxetine (PAXIL) tablet 30 mg  30 mg Oral Q breakfast Tasrif Ahmed, MD        Musculoskeletal: Strength & Muscle Tone: tremors, episodic brief clonic movements  Gait & Station: unable to stand Patient leans: N/A  Psychiatric Specialty Exam: Physical Exam  ROS denies headache, denies chest pain, denies nausea, denies vomiting , as noted, recent diarrhea reported, although patient does not endorse   Blood pressure 103/67, pulse 81, temperature 98.2 F (36.8 C), resp. rate 16, height 6' (1.829 m), weight 239 lb 6.7 oz (108.6 kg), SpO2 99 %.Body mass index is 32.47 kg/m.  General Appearance: Fairly Groomed  Eye Contact:  Fair  Speech:  Slow  Volume:  Decreased  Mood:  denies depression   Affect:  blunted, flat   Thought Process:  Slow   Orientation:  Other:  oriented only to person at this time   Thought Content:  does not endorse hallucinations and does not appear internally preoccupied or agitated at this time  Suicidal Thoughts:  No patient does  categorically deny any recent suicidal ideations or attempt and denies any purposeful lithium overdose , denies SI at this time  Homicidal Thoughts:  No  Memory:  recent and remote impaired   Judgement:  Impaired  Insight:  Lacking  Psychomotor Activity:  tremors, but no psychomotor agitation  Concentration:  Concentration: Poor and Attention Span: Poor  Recall:  Poor  Fund of Knowledge:  Poor  Language:  Poor  Akathisia:  Negative  Handed:  Right  AIMS (if indicated):     Assets:  Desire for Improvement Resilience  ADL's:  Impaired  Cognition:  Impaired,  Severe  Sleep:      Assessment - Changes in mental status, tremors , myoclonus may be associated with Li toxicity - at this time patient denies depression or suicide attempt. Presents confused and information he provides is limited . Report in chart of recent diarrhea, which itself can be a side effect of lithium and which could , if causing dehydration, contribute to increased serum levels  Treatment Plan Summary: Daily contact with patient to assess and evaluate symptoms and progress in treatment  Disposition: No evidence of imminent risk to self or others at present.   Psychiatry consultant will follow with  you   Consider holding Paxil, particularly if concerned about serotonin syndrome , the risk of which may be elevated with SSRI/Lithium concomitant treatment  Of note Lithium can cause or contribute to leukocytosis Continue to monitor Lithium serum levels frequently -IF  levels increasing ,felt to be causing significant symptoms of toxicity, may consider hemodyalisis, if medically appropriate.    Neita Garnet, MD 01/30/2016 3:01 PM

## 2016-01-31 LAB — BASIC METABOLIC PANEL
Anion gap: 4 — ABNORMAL LOW (ref 5–15)
CHLORIDE: 108 mmol/L (ref 101–111)
CO2: 25 mmol/L (ref 22–32)
CREATININE: 1.02 mg/dL (ref 0.61–1.24)
Calcium: 9.4 mg/dL (ref 8.9–10.3)
GFR calc Af Amer: >60 mL/min (ref >60)
GFR calc non Af Amer: >60 mL/min (ref >60)
GLUCOSE: 137 mg/dL — AB (ref 65–99)
Potassium: 4.4 mmol/L (ref 3.5–5.1)
SODIUM: 137 mmol/L (ref 135–145)

## 2016-01-31 LAB — LITHIUM LEVEL: LITHIUM LVL: 1.4 mmol/L — AB (ref 0.60–1.20)

## 2016-01-31 NOTE — Discharge Summary (Signed)
Name: Romance Arquette MRN: [id number] DOB: [birth year] 65 y.o. PCP: Molli Barrows, MD  Date of Admission: 01/29/2016 10:25 AM Date of Discharge: 02/03/2016 Attending Physician: Axel Filler, MD  Discharge Diagnosis: Lithium toxicity Principal Problem:   Lithium toxicity Active Problems:   Depression   Confusion   Hyperlipidemia   Hypokalemia    Discharge Medications:   Medication List    STOP taking these medications   ALPRAZolam 0.25 MG tablet Commonly known as:  XANAX   AXIRON 30 MG/ACT Soln Generic drug:  Testosterone   fish oil-omega-3 fatty acids 1000 MG capsule   lithium carbonate 300 MG capsule   OLANZapine 5 MG tablet Commonly known as:  ZYPREXA   traZODone 100 MG tablet Commonly known as:  DESYREL   zolpidem 10 MG tablet Commonly known as:  AMBIEN     TAKE these medications   calcium carbonate 600 MG Tabs tablet Commonly known as:  OS-CAL Take 600 mg by mouth daily.   doxazosin 8 MG tablet Commonly known as:  CARDURA Take 1 tablet (8 mg total) by mouth at bedtime. BPH What changed:  how much to take   megestrol 40 MG tablet Commonly known as:  MEGACE Take 40 mg by mouth daily.   multivitamin with minerals tablet Take 1 tablet by mouth daily.   omeprazole 40 MG capsule Commonly known as:  PRILOSEC Take 1 capsule (40 mg total) by mouth daily as needed. What changed:  when to take this  reasons to take this   ondansetron 4 MG tablet Commonly known as:  ZOFRAN Take 1 tablet (4 mg total) by mouth every 6 (six) hours. What changed:  when to take this  reasons to take this   PARoxetine 30 MG tablet Commonly known as:  PAXIL Take 1 tablet (30 mg total) by mouth every morning. What changed:  how much to take  Another medication with the same name was removed. Continue taking this medication, and follow the directions you see here.       Disposition and follow-up:   Mr.Taelyn Sedor was discharged from [facility] in Stable condition.  At the hospital follow up visit please address:  1.  Lithium Toxicity with Depression and Dementia --f/u status of depression; discharged on paxil 30mg  daily - reassess need for further management --outpatient assessment for mental decline once on a stable depression regimen  2.  Labs / imaging needed at time of follow-up: BMP (K level)  3.  Pending labs/ test needing follow-up: Outpatient Neurology and Psychiatry follow up  Follow-up Appointments: Follow-up Information    MANNING, Drue Stager., MD. Call today.   Specialty:  Family Medicine Why:  Please make an appointment in 2-4 weeks for a hospital follow up. Contact information: [place] [address] [phone]        [facility] .   Specialty:  Behavioral Health Why:  Please call to make an appointment in one week. 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 Lithium level of 2.37 (theraputic goal of 0.6-1.2). Likely acute on chronic in setting of recent diarrhea.  Kidney function was at baseline (Cr 1.16) on admission and remained stable. TSH wnl. CT head shows only chronic microvascular changes and atrophy. Vit B12 wnl; RPR and HIV nonreactive. Lithium levels downtrending, at discharge it was 0.47.  Depression: Patient with long history of depression on multiple psychiatric medications and recently started lithium. We held his sedating medications (ambien, trazodone, percocet, and xanex). We also held his Paxil 30mg  BID. Per wife, he has not expressed SI/HI recently. His Vit B12 was wnl, RPR and HIV  ab were nonreactive. We are not restarting lithium on d/c as patient has f/u appointment with psychiatry next week. We restarted Paxil 30mg  daily.  Dementia: Patient with questionable history of dementia. Unable to find documentation of baseline status but while in hospital, patient is clearly confused, has no comprehension of time or place. This could be complicated by concurrent depression. Per wife, at home he is normally confused, but we could not elicit objective finding to compare current state against baseline. 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 Vit B12 wnl; RPR and HIV nonreactive. CT head shows chronic microvascular changes and atrophy. Patient has history of insomnia that was managed outpatient with ambien and trazadone qhs, and also has scripts for percocet and xanax daily. While inpatient, we have held all of these; patient is not consistently sleeping through night, but is often sleeping during daytime. Probably a function of dementia. We will not be continuing the sedative medicines on discharge. Would recommend outpatient neuro referral for further workup.  Hypokalemia: Patient present with K of 3.1 and Magnesium wnl. Repleted with IV KCl and the hypokalemia has resolved.  Discharge Vitals:   BP (!) 119/93 (BP Location: Right Leg)   Pulse (!) 107   Temp 98.9 F (37.2 C) (Oral)   Resp 18   Ht 6\' 1"  (1.854 m)   Wt 224 lb 4.8 oz (101.7 kg) Comment: extras removed from bed  SpO2 98%   BMI 29.59 kg/m   Pertinent Labs, Studies, and Procedures:   CT head wo contrast 01/29/2016:  --no acute intracranial abnormalities --chronic microvascular disease and brain atrophy   Discharge Instructions: Discharge Instructions    Diet - low sodium heart healthy    Complete by:  As directed   Increase activity slowly    Complete by:  As directed      Signed: Martyn Malay, DO PGY-3 Internal Medicine Resident Pager # [phone] 02/03/2016 5:26 PM

## 2016-01-31 NOTE — Progress Notes (Signed)
   Subjective: Patient is without complaints this morning. He is able to say his name, DOB, and say he has no complaints.   Per conversations with wife on phone, patient has baseline confusion and memory deficits, but is unable to qualify ina way that we can assess if he is currently at baseline or not.   Objective:  Vital signs in last 24 hours: Vitals:   01/30/16 1556 01/30/16 1809 01/30/16 2013 01/31/16 0900  BP: 113/75 113/78 118/68 131/80  Pulse: 77 75 82 75  Resp:  18 18 18   Temp: 98.6 F (37 C) 98.2 F (36.8 C) 98.8 F (37.1 C) 98.7 F (37.1 C)  TempSrc: Oral Oral Oral Oral  SpO2: 100% 100% 94% 90%  Weight:   108.1 kg (238 lb 5.1 oz)   Height:       Constitutional: NAD, sitting in chair comfortably, eating breakfast Neuro: Alert to self only, able to follow simple commands after few prompts, strength intact bilaterally, CN 2-12 grossly intact, resting and intention tremors much improved but still mildly present CV: RRR, no murmurs, rubs or gallops appreciated Resp: CTAB, no increased work of breathing, no wheezing or crackles appreciated Abd: soft, +BS, NDNT Ext: 2+ pulses throughout, no edema, rash or lesions appreciated  Assessment/Plan:  Principal Problem:   Lithium toxicity Active Problems:   Depression   Confusion   Hyperlipidemia   Hypokalemia  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 Lithium level of 2.37 (theraputic goal of 0.6-1.2). Likely acute on chronic in setting of recent diarrhea. Kidney function remains at baseline (Cr 1.16) >1.02. TSH wnl. CT head shows only chronic microvascular changes and atrophy. Vit B12 wnl; RPR and HIV nonreactive. Lithium levels downtrending, most recent at 1.4. --Hold lithium --IVF D5 1/2 NS @125  continuous --monitor, recheck Li and BMP in AM --PT/OT consult>SNF unless increases recovery --will try and  contact wife to assess how he is compared to his baseline at home  Depression: Patient with long history of depression on multiple psychiatric medications and recently started lithium; will hold sedating meds as able (ambien, trazodone, percocet, xanex). Per wife, he has not expressed SI/HI recently. --Paxil 30mg  daily --Vit B12 wnl; RPR and HIV ab non reactive --holding lithium, may or may not restart   Hypokalemia: Patient present with K of 3.1 and Magnesium wnl. Repleted with IV KCl. Resolved; K 3.7. --monitor  Dispo: Anticipated discharge in approximately 1-2 day(s).   Alphonzo Grieve, MD 01/31/2016, 1:55 PM Pager: [phone]

## 2016-01-31 NOTE — Progress Notes (Signed)
  Date: 01/31/2016  Patient name: Calvin Escobar  Medical record number: [id number]  Date of birth: 1950/11/10   Patient seen and examined. Case d/w residents in detail. I agree with findings and plan as documented in Dr. Winfred Burn note.  Patient is mildly improved today. He remains confused but does remember his name and says he used to work in [employer].  Patient with possible lithium toxicity causing worsening confusion and tremors. Will continue to hold lithium and follow levels. C/w IVF for now. PT/OT recommending SNF once stable for d/c.    Aldine Contes, MD 01/31/2016, 4:35 PM

## 2016-01-31 NOTE — Progress Notes (Signed)
Telemetry box changed to 6E14.  CMT notified.

## 2016-01-31 NOTE — Evaluation (Signed)
Occupational Therapy Evaluation Patient Details Name: Calvin Escobar MRN: [id number] DOB: 08/31/1950 Today's Date: 01/31/2016    History of Present Illness  Calvin Escobar is a 65yo male with PMH of hyperlipidemia, depression presenting with worsening confusion and tremors. Lithium Toxicity   Clinical Impression   Eval limited due to pt's cognitions. Pt with decline in function and safety with ADLs and ADL mobility. Pt requiring total A with ADLs due to decreased awareness, impulsive, decreased processing ans attention. Pt unable to provide info for baseline/PLOF. Pt able to stand and transfer with +2 assist for safety. Pt would benefit from acute OT services to address impairments to increase level of function and safety    Follow Up Recommendations  SNF;Supervision/Assistance - 24 hour    Equipment Recommendations  Other (comment) (TBD at next venue of care)    Recommendations for Other Services       Precautions / Restrictions Precautions Precautions: Fall Restrictions Weight Bearing Restrictions: No      Mobility Bed Mobility               General bed mobility comments: pt in recliner  Transfers Overall transfer level: Needs assistance Equipment used: Rolling walker (2 wheeled) Transfers: Sit to/from Stand Sit to Stand: Min assist;+2 physical assistance         General transfer comment: pt impulsive, unsafe    Balance                                            ADL Overall ADL's : Needs assistance/impaired     Grooming: Total assistance   Upper Body Bathing: Total assistance   Lower Body Bathing: Total assistance   Upper Body Dressing : Total assistance   Lower Body Dressing: Total assistance       Toileting- Clothing Manipulation and Hygiene: Total assistance   Tub/ Shower Transfer: Total assistance   Functional mobility during ADLs: Minimal assistance;+2 for physical assistance General ADL Comments: pt requiring total A due  to cognition, pt not following commands consistiently, slow processing, impulsive, fixtated on "being on his way home"     Vision  unable to properly assess due to cognition          Pertinent Vitals/Pain Pain Assessment: No/denies pain     Hand Dominance Right   Extremity/Trunk Assessment Upper Extremity Assessment Upper Extremity Assessment: Generalized weakness   Lower Extremity Assessment Lower Extremity Assessment: Defer to PT evaluation       Communication Communication Communication: Expressive difficulties   Cognition Arousal/Alertness: Awake/alert Behavior During Therapy: Flat affect;Restless;Impulsive Overall Cognitive Status: Impaired/Different from baseline Area of Impairment: Safety/judgement;Problem solving         Safety/Judgement: Decreased awareness of safety;Decreased awareness of deficits   Problem Solving: Slow processing;Decreased initiation     General Comments   Pt restless, impulsive                 Home Living Family/patient expects to be discharged to:: Skilled nursing facility Living Arrangements: Spouse/significant other;Children Available Help at Discharge: Family;Available PRN/intermittently Type of Home: House                           Additional Comments: Calvin Escobar was unable to answer questions about PLOF      Prior Functioning/Environment          Comments: uncertain of  baseline PLOF due to pt's cognition. No family member/caregiver present to provide PLOF/baseline    OT Diagnosis: Generalized weakness;Cognitive deficits   OT Problem List: Impaired balance (sitting and/or standing);Decreased cognition;Decreased knowledge of precautions;Decreased knowledge of use of DME or AE;Decreased activity tolerance;Decreased safety awareness   OT Treatment/Interventions: Self-care/ADL training;DME and/or AE instruction;Therapeutic activities;Patient/family education    OT Goals(Current goals can be found in the  care plan section) Acute Rehab OT Goals Patient Stated Goal: did not state OT Goal Formulation: Patient unable to participate in goal setting Time For Goal Achievement: 02/07/16 Potential to Achieve Goals: Good ADL Goals Pt Will Perform Grooming: with mod assist;sitting (with mod multimodal cues) Pt Will Perform Upper Body Bathing: with mod assist;sitting (with mod multimodal cues) Pt Will Perform Lower Body Bathing: with mod assist;sitting/lateral leans;sit to/from stand (with mod multimodal cues) Pt Will Perform Lower Body Dressing: with mod assist;sitting/lateral leans;sit to/from stand (with mod multimodal cues) Pt Will Transfer to Toilet: with min assist;bedside commode;regular height toilet;ambulating;grab bars (with mod multimodal cues) Pt Will Perform Toileting - Clothing Manipulation and hygiene: with mod assist;sitting/lateral leans;sit to/from stand (with mod multimodal cues) Additional ADL Goal #1: Pt will follow 3/4 commands consistently for ADLs  OT Frequency: Min 2X/week   Barriers to D/C: Decreased caregiver support                        End of Session Equipment Utilized During Treatment: Rolling walker;Gait belt  Activity Tolerance: Other (comment) (pt limited by cognition) Patient left: in chair;with chair alarm set;with call bell/phone within reach   Time: 1419-1438 OT Time Calculation (min): 19 min Charges:  OT General Charges $OT Visit: 1 Procedure OT Evaluation $OT Eval Moderate Complexity: 1 Procedure G-Codes:    Calvin Escobar 01/31/2016, 2:53 PM

## 2016-01-31 NOTE — Progress Notes (Signed)
Physical Therapy Treatment Patient Details Name: Calvin Escobar MRN: [id number] DOB: 02-08-1951 Today's Date: 01/31/2016    History of Present Illness  Calvin Escobar is a 65yo male with PMH of hyperlipidemia, depression presenting with worsening confusion and tremors. Lithium Toxicity    PT Comments    Making progress with functional mobility, with noted less jerk-like motions and less knee buckling; requires near max cueing, and repetition of cues, then ultimately tactile cueing for pathfinding to room and bathroom; Slower progress than I had anticipated, will recommend SNF;   Noted we are still working on figuring out Calvin Escobar's baseline cognition  Follow Up Recommendations  SNF     Equipment Recommendations  Rolling walker with 5" wheels;3in1 (PT)    Recommendations for Other Services       Precautions / Restrictions Precautions Precautions: Fall Restrictions Weight Bearing Restrictions: No    Mobility  Bed Mobility               General bed mobility comments: pt in recliner  Transfers Overall transfer level: Needs assistance Equipment used: Rolling walker (2 wheeled) Transfers: Sit to/from Stand Sit to Stand: Min assist;+2 physical assistance;Mod assist         General transfer comment: pt impulsive, unsafe, mod assist of 2 for initial sit to stand from recliner; min assist and use of grab bar for rise from standard toilet height  Ambulation/Gait Ambulation/Gait assistance: +2 safety/equipment;Min assist Ambulation Distance (Feet): 40 Feet Assistive device: Rolling walker (2 wheeled) Gait Pattern/deviations: Step-through pattern;Decreased step length - right;Decreased step length - left;Decreased stride length;Trunk flexed     General Gait Details: Impulsive walking back to room to go to bathroom; less jerking motions noted and very little knee buckling   Stairs            Wheelchair Mobility    Modified Rankin (Stroke Patients Only)        Balance Overall balance assessment: Needs assistance         Standing balance support: Bilateral upper extremity supported Standing balance-Leahy Scale: Poor                      Cognition Arousal/Alertness: Awake/alert Behavior During Therapy: Flat affect;Restless;Impulsive Overall Cognitive Status: Impaired/Different from baseline Area of Impairment: Safety/judgement;Problem solving         Safety/Judgement: Decreased awareness of safety;Decreased awareness of deficits   Problem Solving: Slow processing;Decreased initiation      Exercises      General Comments        Pertinent Vitals/Pain Pain Assessment: No/denies pain    Home Living Family/patient expects to be discharged to:: Skilled nursing facility Living Arrangements: Spouse/significant other;Children Available Help at Discharge: Family;Available PRN/intermittently Type of Home: House         Additional Comments: Calvin Escobar was unable to answer questions about PLOF    Prior Function        Comments: uncertain of baseline PLOF   PT Goals (current goals can now be found in the care plan section) Acute Rehab PT Goals Patient Stated Goal: did not state PT Goal Formulation: With patient Time For Goal Achievement: 02/13/16 Potential to Achieve Goals: Good Progress towards PT goals: Progressing toward goals    Frequency  Min 3X/week    PT Plan Current plan remains appropriate    Co-evaluation             End of Session Equipment Utilized During Treatment: Gait belt Activity Tolerance: Patient tolerated treatment well  Patient left: in chair;with call bell/phone within reach;with chair alarm set (Sitting at nurse's station)     Time: [id number] PT Time Calculation (min) (ACUTE ONLY): 21 min  Charges:  $Gait Training: 8-22 mins                    G Codes:      [name] 01/31/2016, 4:15 PM  [name], [place] Pager [phone] Office  [phone]

## 2016-02-01 LAB — BASIC METABOLIC PANEL
ANION GAP: 8 (ref 5–15)
BUN: <5 mg/dL — ABNORMAL LOW (ref 6–20)
CALCIUM: 9.3 mg/dL (ref 8.9–10.3)
CO2: 24 mmol/L (ref 22–32)
CREATININE: 0.92 mg/dL (ref 0.61–1.24)
Chloride: 106 mmol/L (ref 101–111)
GFR calc Af Amer: >60 mL/min (ref >60)
GLUCOSE: 104 mg/dL — AB (ref 65–99)
Potassium: 3.8 mmol/L (ref 3.5–5.1)
Sodium: 138 mmol/L (ref 135–145)

## 2016-02-01 LAB — LITHIUM LEVEL: Lithium Lvl: 1.08 mmol/L (ref 0.60–1.20)

## 2016-02-01 NOTE — NC FL2 (Signed)
  [place] LEVEL OF CARE SCREENING TOOL     IDENTIFICATION  Patient Name: Calvin Escobar Birthdate: 05-Dec-1950 Sex: male Admission Date (Current Location): 01/29/2016  [facility] and [place] Number:  Herbalist and Address:  [facility], [place] [address] [place], [place] [address]      Provider Number: [id number]  Attending Physician Name and Address:  Aldine Contes, MD  Relative Name and Phone Number:  Terril Junkins - wife.  [phone] (h) and [phone] (c)    Current Level of Care: Hospital Recommended Level of Care: [place] Prior Approval Number:    Date Approved/Denied:   PASRR Number:  (Submitted for PASRR 02/01/16)  Discharge Plan: SNF    Current Diagnoses: Patient Active Problem List   Diagnosis Date Noted  . Lithium toxicity 01/29/2016  . Confusion 01/29/2016  . Dementia   . Hyperlipidemia   . Hypokalemia   . 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 Depression 05/10/2007  . BRONCHITIS 05/10/2007  . SKIN CANCER, HX OF 05/10/2007    Orientation RESPIRATION BLADDER Height & Weight     Self  Normal Continent Weight: 239 lb 13.8 oz (108.8 kg) Height:  6' (182.9 cm)  BEHAVIORAL SYMPTOMS/MOOD NEUROLOGICAL BOWEL NUTRITION STATUS  Other (Comment) (Patient is signficantly confused primarily due to Lithium toxicity  )   Continent Diet (Heart healthy)  AMBULATORY STATUS COMMUNICATION OF NEEDS Skin   Extensive Assist Verbally Normal                       Personal Care Assistance Level of Assistance  Bathing, Feeding, Dressing (Patient currently experiencing worsening tremors, along with confusion) Bathing Assistance: Maximum assistance Feeding assistance: Limited assistance Dressing Assistance: Maximum assistance     Functional Limitations Info  Sight, Hearing, Speech Sight Info: Adequate Hearing Info: Adequate Speech Info: Adequate    SPECIAL CARE FACTORS FREQUENCY  PT (By licensed PT)        PT Frequency: Evaluation 8/28 and a minimum of 3X per week therapy recommended  OT Frequency:   Evaluation 8/29 and a minimum of 2X per week therapy recommended        Contractures Contractures Info: Not present    Additional Factors Info  Code Status, Allergies Code Status Info: Full Allergies Info: No known allergies           Current Medications (02/01/2016):  This is the current hospital active medication list Current Facility-Administered Medications  Medication Dose Route Frequency Provider Last Rate Last Dose  . calcium carbonate (OS-CAL - dosed in mg of elemental calcium) tablet 1,250 mg  1,250 mg Oral Q breakfast Tasrif Ahmed, MD   1,250 mg at 02/01/16 0950  . doxazosin (CARDURA) tablet 8 mg  8 mg Oral QHS Tasrif Ahmed, MD   8 mg at 01/31/16 2148  . enoxaparin (LOVENOX) injection 40 mg  40 mg Subcutaneous Q24H Tasrif Ahmed, MD   40 mg at 01/31/16 1833  . feeding supplement (ENSURE ENLIVE) (ENSURE ENLIVE) liquid 237 mL  237 mL Oral BID BM Nischal Narendra, MD   237 mL at 02/01/16 1000     Discharge Medications: Please see discharge summary for a list of discharge medications.  Relevant Imaging Results:  Relevant Lab Results:   Additional Information ss#[id number].  Sable Feil, LCSW

## 2016-02-01 NOTE — Progress Notes (Signed)
  Date: 02/01/2016  Patient name: Calvin Escobar  Medical record number: [id number]  Date of birth: 28-Jan-1951   Patient seen and examined. Case d/w residents in detail. I agree with findings and plan as documented in Dr. Winfred Burn note.  Patient with no new complaints. He still remains confused but is conversant and is closer to baseline per resident discussion with wife. His symptoms were likely secondary to Lithium toxicity, Lithium level now down to therapeutic range. Would continue to hold lithium and d/c IVF today. Will restart Paxil at d/c. Possible d/c in AM to SNF with close outpatient f/u with Psych.  Aldine Contes, MD 02/01/2016, 3:32 PM

## 2016-02-01 NOTE — Progress Notes (Signed)
   Subjective: Patient is without complaints this morning. He is able to say his name, DOB, and say he has no complaints.   Per wife, patient seems to be almost back to baseline - he had memory deficits and needed frequent re-direction; he would also tend to drop cups. But in last few days before admission had increasing confusion and new onset tremors.  Objective:  Vital signs in last 24 hours: Vitals:   01/31/16 2023 02/01/16 0417 02/01/16 0431 02/01/16 1034  BP: 131/69 121/75 124/88 131/81  Pulse: 75 90 (!) 49 96  Resp: 18 18 18 20   Temp: 97.9 F (36.6 C) 98.4 F (36.9 C) 98.7 F (37.1 C) 98.2 F (36.8 C)  TempSrc: Oral Oral Oral Oral  SpO2: 94% 100% 100% 96%  Weight: 108.8 kg (239 lb 13.8 oz)     Height:       Constitutional: NAD, lying in bed comfortably, eating breakfast Neuro: Alert to self only, able to follow simple commands after few prompts, strength intact bilaterally, CN 2-12 grossly intact, resting and intention tremors much improved from admission but still mildly present CV: RRR, no murmurs, rubs or gallops appreciated Resp: CTAB, no increased work of breathing, no wheezing or crackles appreciated Abd: soft, +BS, NDNT Ext: 2+ pulses throughout, no edema  Assessment/Plan:  Principal Problem:   Lithium toxicity Active Problems:   Depression   Confusion   Hyperlipidemia   Hypokalemia  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 Lithium level of 2.37 (theraputic goal of 0.6-1.2). Likely acute on chronic in setting of recent diarrhea. Kidney function remains at baseline (Cr 1.16) >0.92. TSH wnl. CT head shows only chronic microvascular changes and atrophy. Vit B12 wnl; RPR and HIV nonreactive. Lithium levels downtrending, most recent at 1.08. --Hold lithium; discontinue IVF --monitor, recheck Li and BMP in AM --PT/OT consult>SNF, SW  aware  Depression: Patient with long history of depression on multiple psychiatric medications and recently started lithium; will hold sedating meds as able (ambien, trazodone, percocet, xanex). Per wife, he has not expressed SI/HI recently. --Paxil 30mg  daily-hold; restart at d/c --Vit B12 wnl; RPR and HIV ab non reactive --holding lithium, will not restart at d/c; pt to f/u with psychiatrist outpt   Hypokalemia: Patient present with K of 3.1 and Magnesium wnl. Repleted with IV KCl. Resolved; K 3.8. --monitor  Dispo: Anticipated discharge upon SNF placement.   Alphonzo Grieve, MD 02/01/2016, 11:53 AM Pager: [phone]

## 2016-02-01 NOTE — Clinical Social Work Placement (Addendum)
   CLINICAL SOCIAL WORK PLACEMENT  NOTE  Date:  02/01/2016  Patient Details  Name: Rhyan Ammann MRN: [id number] Date of Birth: Mar 10, 1951  Clinical Social Work is seeking post-discharge placement for this patient at the [place] level of care (*CSW will initial, date and re-position this form in  chart as items are completed):  Yes (Emailed to wife, 8/30)   Patient/family provided with [place] Work Department's list of facilities offering this level of care within the geographic area requested by the patient (or if unable, by the patient's family).  Yes   Patient/family informed of their freedom to choose among providers that offer the needed level of care, that participate in Medicare, Medicaid or managed care program needed by the patient, have an available bed and are willing to accept the patient.  No   Patient/family informed of [facility]'s ownership interest in [facility] and [facility], as well as of the fact that they are under no obligation to receive care at these facilities.  PASRR submitted to EDS on 02/01/16     PASRR number received on       Existing PASRR number confirmed on       FL2 transmitted to all facilities in geographic area requested by pt/family on 02/01/16     FL2 transmitted to all facilities within larger geographic area on       Patient informed that his/her managed care company has contracts with or will negotiate with certain facilities, including the following:         Patient/family informed of bed offers received. 8/30 - Wife contacted by phone at 5:50 pm and given bed offer. CSW will f/u with wife on Thursday, 8/31 with additional bed offers, if any.  Patient chooses bed at       Physician recommends and patient chooses bed at      Patient to be transferred to   on  .  Patient to be transferred to facility by       Patient family notified on   of transfer.  Name of family member notified:         PHYSICIAN Please sign FL2     Additional Comment:    _______________________________________________ Sable Feil, LCSW 02/01/2016, 5:02 PM

## 2016-02-01 NOTE — Clinical Social Work Note (Signed)
Clinical Social Work Assessment  Patient Details  Name: Calvin Escobar MRN: [id number] Date of Birth: 06-13-1950  Date of referral:  02/01/16               Reason for consult:  Facility Placement                Permission sought to share information with:  Other (Patient confused and oriented only to self, wife contacted) Permission granted to share information::   (Patient confused)  Name::     Kandice Robinsons  Agency::     Relationship::  Wife   Contact Information:  [phone] (Home); [phone] (Work)  Housing/Transportation Living arrangements for the past 2 months:  [place] of Information:  Spouse Patient Interpreter Needed:  None Criminal Activity/Legal Involvement Pertinent to Current Situation/Hospitalization:  No - Comment as needed Significant Relationships:  Spouse, Other Family Members Lives with:  Spouse Do you feel safe going back to the place where you live?  Yes (Mrs. Critcher works and cannot care for patient at home. She expressed agreement to doctors and CSW re: SNF placement for rehab) Need for family participation in patient care:  Yes (Comment)  Care giving concerns:  Wife in agreement with ST rehab as she works and is unable to provide the appropriate care/supervision at discharge due to patient's medical needs and confusion.   Social Worker assessment / plan:  CSW talked with Mrs. Go by phone regarding discharge planning and current recommendation of ST rehab for patient. Mrs. Junker is currently employed. Wife was pleasant and open to talked with CSW regarding d/c plans for patient. Mrs. Ladd is employed and expressed agreement with ST rehab, and when asked, responded that patient has never been to rehab and she does not know anything about rehab facilities. Wife informed that a skilled facility can be emailed to her and email address provided.    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  Patient/Family's Understanding of and Emotional Response to Diagnosis, Current Treatment, and Prognosis: Not discussed.  Emotional Assessment Appearance:  Appears stated age Attitude/Demeanor/Rapport:  Other (Confused) Affect (typically observed):  Flat Orientation:  Oriented to Self Alcohol / Substance use:  Tobacco Use, Alcohol Use, Illicit Drugs (Patient reports that he does not smoke cigarettes, has a history of alcohol abuse and does not use illicit drugs.) Psych involvement (Current and /or in the community):  Yes (Comment) (Evaluated 8/28)  Discharge Needs  Concerns to be addressed:  Discharge Planning Concerns, Home Safety Concerns (Patient currently confused and wife works) Readmission within the last 30 days:  No Current discharge risk:  None Barriers to Discharge:  [place], Tyger Wichman [place], [place] 02/01/2016, 4:49 PM

## 2016-02-02 LAB — BASIC METABOLIC PANEL
ANION GAP: 8 (ref 5–15)
BUN: <5 mg/dL — ABNORMAL LOW (ref 6–20)
CHLORIDE: 106 mmol/L (ref 101–111)
CO2: 25 mmol/L (ref 22–32)
Calcium: 9.3 mg/dL (ref 8.9–10.3)
Creatinine, Ser: 0.98 mg/dL (ref 0.61–1.24)
GFR calc non Af Amer: >60 mL/min (ref >60)
Glucose, Bld: 109 mg/dL — ABNORMAL HIGH (ref 65–99)
POTASSIUM: 3.6 mmol/L (ref 3.5–5.1)
Sodium: 139 mmol/L (ref 135–145)

## 2016-02-02 LAB — LITHIUM LEVEL: Lithium Lvl: 0.73 mmol/L (ref 0.60–1.20)

## 2016-02-02 NOTE — Progress Notes (Signed)
  Date: 02/02/2016  Patient name: Calvin Escobar  Medical record number: [id number]  Date of birth: 06-27-1950   Patient seen and examined. Case /dw residents in detail. I agree with findings and plan as documented in Dr. Winfred Burn note.  Patient with persistent confusion. oriented only to self. Uncertain baseline. Lithium levels have now normalized. Would restart his paxil on d/c. I suspect that patient has had progressive dementia and now with delirium in addition to his lithium toxicity. He needs close f/u with his psychiatrist. Will limit sedating medication. No further inpatient w/u for now. Patient stable for d/c to SNF once bed available.    Aldine Contes, MD 02/02/2016, 3:22 PM

## 2016-02-02 NOTE — Progress Notes (Signed)
   Subjective: Patient denies complaints. He is a lot more talkative today but still very much confused. Only alert to self. Patient evaluated at two separate times this morning. First, he was very interactive, almost no resting tremors. Second, he was woken up from nap, increased resting tremors, and not very interactive with increased need for re-direction.  Objective:  Vital signs in last 24 hours: Vitals:   02/01/16 1719 02/01/16 2200 02/02/16 0431 02/02/16 0900  BP: 134/81 135/77 136/78 (!) 121/92  Pulse: 89 95 90 (!) 104  Resp: 18 16 16 18   Temp: 98.6 F (37 C) 98.8 F (37.1 C) 98.1 F (36.7 C) 98.8 F (37.1 C)  TempSrc: Oral Oral Oral Oral  SpO2: 98% 94% 96% 94%  Weight:  101.7 kg (224 lb 4.8 oz)    Height:       Constitutional: NAD, sitting in chair comfortably at nursing station Neuro: Alert to self only, able to follow simple commands, strength intact bilaterally, CN 2-12 grossly intact, resting and intention tremors much improved from admission but still mildly present CV: RRR, no murmurs, rubs or gallops appreciated Resp: CTAB, no increased work of breathing, no wheezing or crackles appreciated Abd: soft, +BS, NDNT Ext: 2+ pulses throughout, no edema  Assessment/Plan:  Principal Problem:   Lithium toxicity Active Problems:   Depression   Confusion   Hyperlipidemia   Hypokalemia  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 Lithium level of 2.37 (theraputic goal of 0.6-1.2). Likely acute on chronic in setting of recent diarrhea. Kidney function remains at baseline (Cr 1.16) >0.98. TSH wnl. CT head shows only chronic microvascular changes and atrophy. Vit B12 wnl; RPR and HIV nonreactive. Lithium levels downtrending, most recent at 0.73. --Hold lithium; will not restart at d/c --monitor, recheck Li and BMP in AM --PT/OT consult>SNF, SW aware and working  on placement  Depression: Patient with long history of depression on multiple psychiatric medications and recently started lithium; will hold sedating meds as able (ambien, trazodone, percocet, xanex). Per wife, he has not expressed SI/HI recently. --Paxil 30mg  daily-hold; restart at d/c --Vit B12 wnl; RPR and HIV ab non reactive --holding lithium, will not restart at d/c; pt to f/u with psychiatrist outpt   Dementia: Patient with questionable history of dementia. Unable to find documentation of baseline status but while in hospital, patient is clearly confused, has no comprehension of time or place. This could be complicated by concurrent depression. Per wife, at home he is normally confused, but we could not elicit objective finding to compare current state against baseline. 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 Vit B 12 wnl; RPR and HIV nonreactive. CT head shows chronic microvascular changes and atrophy. Patient has history of insomnia that was managed outpatient with ambien and trazadone qhs, and also has scripts for percocet and xanax daily. While inpatient, we have held all of these; patient is not consistently sleeping through night, but is often sleeping during daytime. Probably a function of dementia. --continue outpatient workup once patient is not in acute ill state --limit sedating meds as they can increase confusion as well as pose threat for falls.  Hypokalemia: Patient present with K of 3.1 and Magnesium wnl. Repleted with IV KCl. Resolved; K 3.6. --monitor  Dispo: Anticipated discharge upon SNF placement.   Alphonzo Grieve, MD 02/02/2016, 11:38 AM Pager: [phone]

## 2016-02-02 NOTE — Progress Notes (Signed)
Occupational Therapy Treatment Patient Details Name: Calvin Escobar MRN: [id number] DOB: December 15, 1950 Today's Date: 02/02/2016    History of present illness  Calvin Escobar is a 65yo male with PMH of hyperlipidemia, depression presenting with worsening confusion and tremors. Lithium Toxicity   OT comments  Pt has made significant improvement since evaluation. Pt is able to state his name and birth date/age and able to read the calendar to give correct date. Pt knows he is in the hospital but unable to give facility name or why he is here.  Is able to complete ADL task with cues for sequencing at times, min A for sit -stand and occasional min A to maintain dynamic standing balance. Pt distracted, but able to redirect to task with minimal vc.Pt demonstrates word finding deficits and states he "takes pills to help him concentrate and that it is very frustrating to not get out his words". Perseveration noted at times. Pt appropriate throughout session. Continue to recommend rehab at SNF to facilitate return to PLOF. Goals upgraded.   Follow Up Recommendations  SNF;Supervision/Assistance - 24 hour    Equipment Recommendations  Other (comment) (TBA at SNF)    Recommendations for Other Services      Precautions / Restrictions Precautions Precautions: Fall Precaution Comments: Impulsive Restrictions Weight Bearing Restrictions: No       Mobility Bed Mobility               General bed mobility comments: Patient in chair at front desk  Transfers Overall transfer level: Needs assistance Equipment used: Rolling walker (2 wheeled) Transfers: Sit to/from Stand Sit to Stand: Min assist         General transfer comment: Able to scoot to edge of chair and comlete sit - stand at sink with min A    Balance           Standing balance support: Bilateral upper extremity supported Standing balance-Leahy Scale: Poor (posterior bias, although able to stand unsupported for short periods)                     ADL Overall ADL's : Needs assistance/impaired     Grooming: Set up;Supervision/safety;Sitting   Upper Body Bathing: Set up;Supervision/ safety;Sitting   Lower Body Bathing: Sit to/from stand;Minimal assistance Lower Body Bathing Details (indicate cue type and reason): Pt required vc to sequence task ( put soap on washcloth before bahting), but able to physically complete task. Perseveration noted Upper Body Dressing : Moderate assistance (unable to problem solve how to put arms through holes in gown)                   Functional mobility during ADLs: Minimal assistance;Cueing for safety (sit - stand) General ADL Comments: Pt with significant improvement from intial eval regarding ability to complete ADL.       Vision                 Additional Comments: wears glasses. States they are lost  May perform better with glasses   Perception     Praxis      Cognition   Behavior During Therapy: Impulsive;Restless Overall Cognitive Status: Impaired/Different from baseline Area of Impairment: Orientation;Attention;Memory;Following commands;Safety/judgement;Awareness;Problem solving Orientation Level: Disoriented to;Place;Time;Situation Current Attention Level: Sustained Memory: Decreased short-term memory  Following Commands: Follows one step commands with increased time Safety/Judgement: Decreased awareness of safety;Decreased awareness of deficits Awareness: Intellectual Problem Solving: Slow processing;Difficulty sequencing;Requires verbal cues      Extremity/Trunk Assessment  BUE generalized weakness. Tremors present          Exercises     Shoulder Instructions       General Comments  Pt stated "nice to meet you"    Pertinent Vitals/ Pain       Pain Assessment: Faces Faces Pain Scale: No hurt  Home Living                                          Prior Functioning/Environment              Frequency  Min 2X/week     Progress Toward Goals  OT Goals(current goals can now be found in the care plan section)  Progress towards OT goals: Goals met and updated - see care plan  Acute Rehab OT Goals Patient Stated Goal: unable to staet OT Goal Formulation: Patient unable to participate in goal setting Time For Goal Achievement: 02/16/16 ADL Goals Pt Will Perform Grooming: with set-up;standing Pt Will Perform Upper Body Bathing: with set-up;sitting Pt Will Perform Lower Body Bathing: with set-up;with supervision;sit to/from stand Pt Will Perform Lower Body Dressing: with set-up;with supervision;sit to/from stand Pt Will Transfer to Toilet: with min guard assist;bedside commode;ambulating Pt Will Perform Toileting - Clothing Manipulation and hygiene: with supervision;sit to/from stand Additional ADL Goal #1: Pt will demonstrate emergent awareness during ADL task in nondistracting environemtn  Plan Discharge plan remains appropriate    Co-evaluation                 End of Session     Activity Tolerance Patient tolerated treatment well   Patient Left in chair;with call bell/phone within reach;Other (comment) (sitting at nurses station per nsg request)   Nurse Communication          Time: [phone] OT Time Calculation (min): 25 min  Charges: OT General Charges $OT Visit: 1 Procedure OT Treatments $Self Care/Home Management : 23-37 mins  Cinzia Devos,HILLARY 02/02/2016, 4:07 PM   [facility], OTR/L  [phone] 02/02/2016

## 2016-02-02 NOTE — Care Management Important Message (Signed)
Important Message  Patient Details  Name: Calvin Escobar MRN: [id number] Date of Birth: 16-May-1951   Medicare Important Message Given:  Yes    [name] 02/02/2016, 10:51 AM

## 2016-02-02 NOTE — Progress Notes (Signed)
Physical Therapy Treatment Patient Details Name: Calvin Escobar MRN: [id number] DOB: 05/31/1951 Today's Date: 02-23-16    History of Present Illness  Mr. Schlichting is a 65yo male with PMH of hyperlipidemia, depression presenting with worsening confusion and tremors. Lithium Toxicity    PT Comments    Improved activity tolerance and gait today.  Continue to agree with SNF at d/c.  Follow Up Recommendations  SNF;Supervision/Assistance - 24 hour     Equipment Recommendations  Rolling walker with 5" wheels;3in1 (PT)    Recommendations for Other Services       Precautions / Restrictions Precautions Precautions: Fall Precaution Comments: Impulsive Restrictions Weight Bearing Restrictions: No    Mobility  Bed Mobility               General bed mobility comments: Patient in chair at front desk  Transfers Overall transfer level: Needs assistance Equipment used: Rolling walker (2 wheeled) Transfers: Sit to/from Stand Sit to Stand: Min assist;+2 physical assistance         General transfer comment: Verbal and tactile cues for hand placement.  Assist to rise to stance and for balance initially.  Ambulation/Gait Ambulation/Gait assistance: Min assist;+2 safety/equipment Ambulation Distance (Feet): 180 Feet Assistive device: Rolling walker (2 wheeled) Gait Pattern/deviations: Step-through pattern;Decreased stride length;Trunk flexed Gait velocity: decreased Gait velocity interpretation: Below normal speed for age/gender General Gait Details: Verbal cues to stay close to RW.  Chair behind patient due to impulsivity.     Stairs            Wheelchair Mobility    Modified Rankin (Stroke Patients Only)       Balance           Standing balance support: Bilateral upper extremity supported Standing balance-Leahy Scale: Poor                      Cognition Arousal/Alertness: Awake/alert Behavior During Therapy: WFL for tasks  assessed/performed;Restless;Impulsive Overall Cognitive Status: Impaired/Different from baseline Area of Impairment: Orientation;Memory;Safety/judgement;Problem solving Orientation Level: Disoriented to;Place;Time;Situation   Memory: Decreased short-term memory   Safety/Judgement: Decreased awareness of safety;Decreased awareness of deficits   Problem Solving: Slow processing;Difficulty sequencing;Requires verbal cues      Exercises      General Comments        Pertinent Vitals/Pain Pain Assessment: No/denies pain    Home Living                      Prior Function            PT Goals (current goals can now be found in the care plan section) Progress towards PT goals: Progressing toward goals    Frequency  Min 2X/week    PT Plan Current plan remains appropriate;Frequency needs to be updated    Co-evaluation             End of Session Equipment Utilized During Treatment: Gait belt Activity Tolerance: Patient tolerated treatment well Patient left: in chair;with chair alarm set (At nursing station with chair alarm on.)     Time: 1452-1520 PT Time Calculation (min) (ACUTE ONLY): 28 min  Charges:  $Gait Training: 23-37 mins                    G Codes:      Despina Pole 23-Feb-2016, 3:42 PM Carita Pian. Sanjuana Kava, [place] Pager [phone]

## 2016-02-02 NOTE — Clinical Social Work Note (Signed)
Mrs. Hagerman contacted and provided with facility response ([facility]). Contact made with facility regarding patient and insurance authorization initiated.   Rishan Oyama Givens, MSW, LCSW Licensed Clinical Social Worker [place] [phone]

## 2016-02-02 NOTE — Progress Notes (Signed)
OT Cancellation Note  Patient Details Name: Sander Mccrimon MRN: [id number] DOB: Jan 06, 1951   Cancelled Treatment:    Reason Eval/Treat Not Completed: Patient at procedure or test/ unavailable  [place], Thereasa Parkin 02/02/2016, 12:01 PM

## 2016-02-03 DIAGNOSIS — F1021 Alcohol dependence, in remission: Secondary | ICD-10-CM

## 2016-02-03 DIAGNOSIS — T43595A Adverse effect of other antipsychotics and neuroleptics, initial encounter: Secondary | ICD-10-CM

## 2016-02-03 DIAGNOSIS — R251 Tremor, unspecified: Secondary | ICD-10-CM

## 2016-02-03 LAB — BASIC METABOLIC PANEL
Anion gap: 8 (ref 5–15)
BUN: 5 mg/dL — AB (ref 6–20)
CO2: 22 mmol/L (ref 22–32)
Calcium: 9.5 mg/dL (ref 8.9–10.3)
Chloride: 108 mmol/L (ref 101–111)
Creatinine, Ser: 0.93 mg/dL (ref 0.61–1.24)
GFR calc Af Amer: >60 mL/min (ref >60)
GFR calc non Af Amer: >60 mL/min (ref >60)
GLUCOSE: 109 mg/dL — AB (ref 65–99)
Potassium: 3.6 mmol/L (ref 3.5–5.1)
Sodium: 138 mmol/L (ref 135–145)

## 2016-02-03 LAB — LITHIUM LEVEL: LITHIUM LVL: 0.47 mmol/L — AB (ref 0.60–1.20)

## 2016-02-03 MED ORDER — OMEPRAZOLE 40 MG PO CPDR: 40.0000 mg | DELAYED_RELEASE_CAPSULE | Freq: Every day | ORAL | 0 refills | Status: DC | PRN

## 2016-02-03 MED ORDER — PAROXETINE HCL 20 MG PO TABS
30.0000 mg | ORAL_TABLET | Freq: Every day | ORAL | Status: DC
  Administered 2016-02-03: 30 mg via ORAL
  Filled 2016-02-03 (×2): qty 2

## 2016-02-03 MED ORDER — PAROXETINE HCL 30 MG PO TABS: 30.0000 mg | ORAL_TABLET | ORAL | 0 refills | Status: DC

## 2016-02-03 MED ORDER — DOXAZOSIN MESYLATE 8 MG PO TABS: 8.0000 mg | ORAL_TABLET | Freq: Every day | ORAL | 1 refills | Status: DC

## 2016-02-03 NOTE — Progress Notes (Signed)
Internal Medicine Attending:   I saw and examined the patient. I reviewed the resident's note and I agree with the resident's findings and plan as documented in the resident's note.  65 year old man with likely dementia was admitted with acutely worsening functional status and tremulousness, found to have probable lithium toxicity. He has responded well to supportive measures, his tremor is now much improved. Family states that he is closer to his baseline. He continued to be worried about his overall cognitive decline over the last 6 months. On exam today he essentially has nonsensical speech, can repeat sentences appropriately, is unable to do calculations. He has full strength in the upper and lower extremities. He's got a wide shuffling gait. He has a small essential tremor now, no resting tremor or pill-rolling tremor. We are going to plan on discontinuing lithium, continue his SSRI and he will follow-up with his usual psychiatrist next week. I agree with having him follow up with neurology for better evaluation and characterization of his dementia given its rapidity.

## 2016-02-03 NOTE — Discharge Instructions (Signed)
TAKE ALL MEDICATIONS AS PRESCRIBED.  PLEASE FOLLOW UP WITH YOUR PSYCHIATRIST ON Tuesday, September 5TH.

## 2016-02-03 NOTE — Progress Notes (Signed)
   Subjective: Patient was seen and examined this morning. He is much more communicative and ambulatory. He answers some questions appropriately, but also confabulates. Patient's wife and son are available to provide more history. For the last one year, patient has been depressed and diagnosed with dementia. Prior to one year ago, patient was working full time. His company closed down and he became progressively more depressed, not eating, drinking or doing his normal activities during the day. He was diagnosed with depression and dementia and started on medications- even offered shock therapy- which family declined. However, over the last 3 months, patient has become progressively more confused and tremulous.   Objective: Vital signs in last 24 hours: Vitals:   02/02/16 0900 02/02/16 1700 02/02/16 2108 02/03/16 0610  BP: (!) 121/92 127/67 134/82 (!) 119/93  Pulse: (!) 104 98 (!) 120 (!) 107  Resp: 18 18 18 18   Temp: 98.8 F (37.1 C) 98.6 F (37 C) 98.2 F (36.8 C) 98.9 F (37.2 C)  TempSrc: Oral Oral Oral Oral  SpO2: 94% 98% 99% 98%  Weight:      Height:   6\' 1"  (1.854 m)    Physical Exam General: Vital signs reviewed.  Patient is elderly, in no acute distress and cooperative with exam.  Cardiovascular: RRR Pulmonary/Chest: Clear to auscultation bilaterally, no wheezes, rales, or rhonchi. Extremities: Tremors in upper extremities. No lower extremity edema bilaterally Neurological: Moves all extremities. Oriented to self.  Skin: Warm, dry and intact.  Psychiatric: Confabulates. Decreased memory, nonsensical speech.  Assessment/Plan: Principal Problem:   Lithium toxicity Active Problems:   Depression   Confusion   Hyperlipidemia   Hypokalemia  Lithium Toxicity: Patient presented with increasing confusion and tremors, with recent increase of lithium dose one month prior to admission. Lithium level of 2.37 (theraputic goal of 0.6-1.2) on admission. Lithium level 0.47 today.  Patient has improved some, but likely has contributing factors from dementia and depression. -Discontinued Lithium -Awaiting SNF placement -Follow up with Psychiatry on 02/07/16  Depression: Patient with long history of depression on multiple psychiatric medications and recently started lithium. We are also holding sedating meds (ambien, trazodone, percocet, xanex). -Restart Paxil 30 mg daily -Discontinued Lithium -Follow up with Psychiatry on 02/07/16  Dementia: Family reports both psychiatry and two physicians have diagnosed patient with dementia. He has a history of alcohol abuse, but quit 1 year ago. Vit B 12 wnl; RPR and HIV nonreactive. CT head shows chronic microvascular changes and atrophy.   Dispo: Anticipated discharge in approximately 1-2 day(s).   LOS: 4 days   Martyn Malay, DO PGY-3 Internal Medicine Resident Pager # [phone] 02/03/2016 1:46 PM

## 2016-02-03 NOTE — Consult Note (Signed)
[facility] Consult   Reason for Consult:  Lithium toxicity  Referring Physician:  Dr. Andria Frames  Patient Identification: Calvin Escobar MRN:  [id number] Principal Diagnosis: Lithium toxicity Diagnosis:   Patient Active Problem List   Diagnosis Date Noted  . Lithium toxicity [T56.891A] 01/29/2016  . Confusion [R41.0] 01/29/2016  . Dementia [F03.90]   . Hyperlipidemia [E78.5]   . Hypokalemia [E87.6]   . 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 Depression [F32.9] 05/10/2007  . BRONCHITIS [J40] 05/10/2007  . SKIN CANCER, HX OF [Z85.828] 05/10/2007    Total Time spent with patient: 20 minutes  Subjective:   Calvin Escobar is a 65 y.o. male patient admitted with worsening tremors and confusion   HPI:  ( Please refer to my initial consultation note dated 8/28 for further details ). Patient is a 66 year old male, with a history of dementia, depression . Was admitted due to worsening mental status,confusion and worsening tremors, was found to have an elevated lithium serum level - 2.37. At this time patient is significantly  improved compared to 8/28 presentation- at this time he presents  fully alert, more attentive , with much improved speech. Tremors have decreased, but are still present. He does not appear agitated or restless at this time. He remains confused, but to a lesser degree than on 8/28.  Marland Kitchen  He states his mood is " all right", and at this time denies feeling significantly depressed or sad. Denies any suicidal ideations. Denies hallucinations, does not appear internally preoccupied . Denies medication side effects Last lithium serum level 0.47( 9/1)   Past Psychiatric History: history of depression, had been managed with Zoloft and Lithium   Risk to Self: Is patient at risk for suicide?: No Risk to Others:   Prior Inpatient Therapy:   Prior Outpatient Therapy:    Past Medical History:  Past Medical History:  Diagnosis Date  . Allergy seasonal  . Cancer ([place]) skin   . Dementia   . Depression   . Hyperlipidemia     Past Surgical History:  Procedure Laterality Date  . COLONOSCOPY     Family History: No family history on file. Family Psychiatric  History:  Social History:  History  Alcohol Use  . 8.4 oz/week  . 14 Cans of beer per week     History  Drug Use No    Social History   Social History  . Marital status: Married    Spouse name: N/A  . Number of children: N/A  . Years of education: N/A   Social History Main Topics  . Smoking status: Never Smoker  . Smokeless tobacco: Never Used  . Alcohol use 8.4 oz/week    14 Cans of beer per week  . Drug use: No  . Sexual activity: Not Asked   Other Topics Concern  . None   Social History Narrative  . None   Additional Social History:    Allergies:  No Known Allergies  Labs:  Results for orders placed or performed during the hospital encounter of 01/29/16 (from the past 48 hour(s))  Lithium level     Status: None   Collection Time: 02/02/16  4:14 AM  Result Value Ref Range   Lithium Lvl 0.73 0.60 - 1.20 mmol/L  Basic metabolic panel     Status: Abnormal   Collection Time: 02/02/16  4:14 AM  Result Value Ref Range   Sodium 139 135 - 145 mmol/L   Potassium 3.6 3.5 - 5.1 mmol/L  Chloride 106 101 - 111 mmol/L   CO2 25 22 - 32 mmol/L   Glucose, Bld 109 (H) 65 - 99 mg/dL   BUN <5 (L) 6 - 20 mg/dL   Creatinine, Ser 0.98 0.61 - 1.24 mg/dL   Calcium 9.3 8.9 - 10.3 mg/dL   GFR calc non Af Amer >60 >60 mL/min   GFR calc Af Amer >60 >60 mL/min    Comment: (NOTE) The eGFR has been calculated using the CKD EPI equation. This calculation has not been validated in all clinical situations. eGFR's persistently <60 mL/min signify possible Chronic Kidney Disease.    Anion gap 8 5 - 15  Lithium level     Status: Abnormal   Collection Time: 02/03/16  7:02 AM  Result Value Ref Range   Lithium Lvl 0.47 (L) 0.60 - 1.20 mmol/L  Basic metabolic panel     Status: Abnormal   Collection  Time: 02/03/16  7:02 AM  Result Value Ref Range   Sodium 138 135 - 145 mmol/L   Potassium 3.6 3.5 - 5.1 mmol/L   Chloride 108 101 - 111 mmol/L   CO2 22 22 - 32 mmol/L   Glucose, Bld 109 (H) 65 - 99 mg/dL   BUN 5 (L) 6 - 20 mg/dL   Creatinine, Ser 0.93 0.61 - 1.24 mg/dL   Calcium 9.5 8.9 - 10.3 mg/dL   GFR calc non Af Amer >60 >60 mL/min   GFR calc Af Amer >60 >60 mL/min    Comment: (NOTE) The eGFR has been calculated using the CKD EPI equation. This calculation has not been validated in all clinical situations. eGFR's persistently <60 mL/min signify possible Chronic Kidney Disease.    Anion gap 8 5 - 15    Current Facility-Administered Medications  Medication Dose Route Frequency Provider Last Rate Last Dose  . calcium carbonate (OS-CAL - dosed in mg of elemental calcium) tablet 1,250 mg  1,250 mg Oral Q breakfast Tasrif Ahmed, MD   1,250 mg at 02/03/16 0830  . doxazosin (CARDURA) tablet 8 mg  8 mg Oral QHS Tasrif Ahmed, MD   8 mg at 02/03/16 0023  . enoxaparin (LOVENOX) injection 40 mg  40 mg Subcutaneous Q24H Tasrif Ahmed, MD   40 mg at 02/02/16 1732  . feeding supplement (ENSURE ENLIVE) (ENSURE ENLIVE) liquid 237 mL  237 mL Oral BID BM Nischal Narendra, MD   237 mL at 02/03/16 1600  . PARoxetine (PAXIL) tablet 30 mg  30 mg Oral Q breakfast Alexa R Quay Burow, MD   30 mg at 02/03/16 1611    Musculoskeletal: Strength & Muscle Tone: within normal limits- some distal tremors noted, but improved compared to prior  Gait & Station: gait not examined  Patient leans: N/A  Psychiatric Specialty Exam: Physical Exam  ROS  Blood pressure (!) 119/93, pulse (!) 107, temperature 98.9 F (37.2 C), temperature source Oral, resp. rate 18, height 6' 1"  (1.854 m), weight 224 lb 4.8 oz (101.7 kg), SpO2 98 %.Body mass index is 29.59 kg/m.  General Appearance: Fairly Groomed in hospital garb   Eye Contact:  Improved   Speech:  Improved   Volume:  improved   Mood:  describes mood as improved, " all  right"  Affect:  more reactive , smiles briefly at times   Thought Process:  Slowed, but improved compared to prior presentation  Orientation:  Other:  alert, more attentive, oriented to person, partially to place ( hospital, [facility]), disoriented to time ( unable to provide  month or date, stated year was 52)   Thought Content:  denies hallucinations, not internally preoccupied at this time  Suicidal Thoughts:  No- denies suicidal ideations   Homicidal Thoughts:  No  Memory:  recent and remote limited, poor   Judgement:  Fair  Insight:  Lacking  Psychomotor Activity:  Tremor and tremor is improved , at this time no psychomotor agitation   Concentration:  Concentration: Fair and Attention Span: improved   Recall:  [employer] of Knowledge:  Fair  Language:  improved   Akathisia:  No  Handed:  Right  AIMS (if indicated):     Assets:  Desire for Improvement Social Support  ADL's:  Impaired  Cognition:  Impaired,  Moderate  Sleep:      Assessment - patient presents with improvement ( please refer to my initial assessment dated 8/28)  He presents more alert, more attentive, more communicative , with improving speech, and although still confused, better oriented. Tremors have decreased .He is currently denying any significant depression or suicidal ideations . Denies medication side effects at this time ( currently off Lithium, on Paxil)   Treatment Plan Summary: as below   Disposition: No evidence of imminent risk to self or others at present.   Agree with Paxil for management of depression , currently at 30 mgrs QDAY - would monitor for sedation, as  Paxil may be more likely to cause this side effect than other SSRIs . At this time would not restart Lithium. Patient should follow up with outpatient psychiatrist for management and with Neurology regarding Demential management .   Neita Garnet, MD 02/03/2016 4:19 PM

## 2016-02-03 NOTE — Clinical Social Work Placement (Signed)
   CLINICAL SOCIAL WORK PLACEMENT  NOTE 02/03/16 - DISCHARGED TO [facility]  Date:  02/03/2016  Patient Details  Name: Calvin Escobar MRN: [id number] Date of Birth: 10-13-1950  Clinical Social Work is seeking post-discharge placement for this patient at the [place] level of care (*CSW will initial, date and re-position this form in  chart as items are completed):  Yes (Emailed to wife, 8/30)   Patient/family provided with [facility] [place] Work Department's list of facilities offering this level of care within the geographic area requested by the patient (or if unable, by the patient's family).  Yes   Patient/family informed of their freedom to choose among providers that offer the needed level of care, that participate in Medicare, Medicaid or managed care program needed by the patient, have an available bed and are willing to accept the patient.  No   Patient/family informed of [facility]'s ownership interest in [facility] and [facility], as well as of the fact that they are under no obligation to receive care at these facilities.  PASRR submitted to EDS on 02/01/16     PASRR number received on  02/02/16     Existing PASRR number confirmed on       FL2 transmitted to all facilities in geographic area requested by pt/family on 02/01/16     FL2 transmitted to all facilities within larger geographic area on       Patient informed that his/her managed care company has contracts with or will negotiate with certain facilities, including the following:         02/01/16 - Patient/family informed of bed offers received.  Patient chooses bed at  [facility]      Physician recommends and patient chooses bed at      Patient to be transferred to  [facility] on  02/03/16.  Patient to be transferred to facility by  ambulance     Patient family notified on  02/03/16 of transfer.  Name of family member notified:   Wife, Jvonte Berrian [phone] - mobile)      PHYSICIAN Please sign FL2     Additional Comment:  02/03/16  - Received insurance authorization from [employer] effective 9/1/7 - [id number] for 3 days. Next review is 9/3 and RUG Level is RVB.  This information provided to Irena, admissions staff at [facility].    _______________________________________________ Sable Feil, LCSW 02/03/2016, 7:47 PM

## 2017-08-05 DIAGNOSIS — F5101 Primary insomnia: Secondary | ICD-10-CM | POA: Insufficient documentation

## 2019-06-09 ENCOUNTER — Encounter: Payer: Self-pay | Admitting: Cardiology

## 2019-06-09 ENCOUNTER — Other Ambulatory Visit: Payer: Self-pay

## 2019-06-09 ENCOUNTER — Telehealth (INDEPENDENT_AMBULATORY_CARE_PROVIDER_SITE_OTHER): Payer: Medicare HMO | Admitting: Cardiology

## 2019-06-09 VITALS — Ht 73.0 in | Wt 300.0 lb

## 2019-06-09 DIAGNOSIS — G4733 Obstructive sleep apnea (adult) (pediatric): Secondary | ICD-10-CM | POA: Diagnosis not present

## 2019-06-09 DIAGNOSIS — I493 Ventricular premature depolarization: Secondary | ICD-10-CM

## 2019-06-09 DIAGNOSIS — I491 Atrial premature depolarization: Secondary | ICD-10-CM | POA: Diagnosis not present

## 2019-06-09 NOTE — H&P (View-Only) (Signed)
Virtual Visit via Telephone Note   This visit type was conducted due to national recommendations for restrictions regarding the COVID-19 Pandemic (e.g. social distancing) in an effort to limit this patient's exposure and mitigate transmission in our community.  Due to his co-morbid illnesses, this patient is at least at moderate risk for complications without adequate follow up.  This format is felt to be most appropriate for this patient at this time.  The patient did not have access to video technology/had technical difficulties with video requiring transitioning to audio format only (telephone).  All issues noted in this document were discussed and addressed.  No physical exam could be performed with this format.  Please refer to the patient's chart for his  consent to telehealth for Calvin Escobar.   Evaluation Performed:  Cardiology Consult  This visit type was conducted due to national recommendations for restrictions regarding the COVID-19 Pandemic (e.g. social distancing).  This format is felt to be most appropriate for this patient at this time.  All issues noted in this document were discussed and addressed.  No physical exam was performed (except for noted visual exam findings with Video Visits).  Please refer to the patient's chart (MyChart message for video visits and phone note for telephone visits) for the patient's consent to telehealth for [name].  Date:  06/09/2019   ID:  Calvin Escobar, DOB Sep 09, 1950, MRN [id number]  Patient Location:  Home  Provider location:   [place]  PCP: Calvin Limbo, MD Cardiologist:  Fransico Him, MD (NEW) Electrophysiologist:  None   Chief Complaint:  Palpitations  History of Present Illness:    Calvin Escobar is a 69 y.o. male who presents via audio/video conferencing for a telehealth visit today in referral by Calvin Escobar, MR and [name], MD.  This is a 69yo male with a hx of depression, ETOH abuse (sober 7 years), dementia and  HLD.  He recently had a sleep study done for severe daytime fatigue and was noted to have frequent PVCs and PACs on tele along with severe OSA with nocturnal hypoxemia as low as 55%>  He has been set up for CPAP but has not received his device yet.     He denies any chest pain or pressure, SOB, DOE, PND, orthopnea, LE edema, dizziness, palpitations or syncope. He is compliant with his meds and is tolerating meds with no SE.    The patient does not have symptoms concerning for COVID-19 infection (fever, chills, cough, or new shortness of breath).   Prior CV studies:   The following studies were reviewed today:  none  Past Medical History:  Diagnosis Date  . Allergy seasonal  . Cancer ([name]) skin  . Dementia ([name])   . Depression   . Hyperlipidemia    Past Surgical History:  Procedure Laterality Date  . COLONOSCOPY       Current Meds  Medication Sig  . pravastatin (PRAVACHOL) 20 MG tablet Take 20 mg by mouth daily.  . traZODone (DESYREL) 50 MG tablet Take 50 mg by mouth at bedtime as needed.      Allergies:   Patient has no known allergies.   Social History   Tobacco Use  . Smoking status: Never Smoker  . Smokeless tobacco: Never Used  Substance Use Topics  . Alcohol use: Yes    Alcohol/week: 14.0 standard drinks    Types: 14 Cans of beer per week  . Drug use: No     Family Hx: The patient's family  history is not on file.  ROS:   Please see the history of present illness.     All other systems reviewed and are negative.   Labs/Other Tests and Data Reviewed:    Recent Labs: No results found for requested labs within last 8760 hours.   Recent Lipid Panel No results found for: CHOL, TRIG, HDL, CHOLHDL, LDLCALC, LDLDIRECT  Wt Readings from Last 3 Encounters:  06/09/19 300 lb (136.1 kg)  02/01/16 224 lb 4.8 oz (101.7 kg)  09/15/10 (!) 290 lb (131.5 kg)     Objective:    Vital Signs:  Ht 6\' 1"  (1.854 m)   Wt 300 lb (136.1 kg)   BMI 39.58 kg/m     CONSTITUTIONAL:  Well nourished, well developed male in no acute distress.  EYES: anicteric MOUTH: oral mucosa is pink RESPIRATORY: Normal respiratory effort, symmetric expansion CARDIOVASCULAR: No peripheral edema SKIN: No rash, lesions or ulcers MUSCULOSKELETAL: no digital cyanosis NEURO: Cranial Nerves II-XII grossly intact, moves all extremities PSYCH: Intact judgement and insight.  A&O x 3, Mood/affect appropriate   ASSESSMENT & PLAN:    1.  PACs -likely triggered by severe OSA and hypoxemia -these were noted during sleep study -he is asymptomatic  2.  PVCs -again noted at the time of the sleep study and likely due to nocturnal hypoxemia -will get a 2 week Ziopatch to assess PVC load -if PVC load low then would not treat as he is asymptomatic -will wait until he is on CPAP to assess with the heart monitor. -2D echo to rule out structural heart disease -he has no anginal sx  3.  OSA with nocturnal hypoemia -this is followed by neurology at [name] -he is getting set up with CPAP in the near furture  COVID-19 Education: The signs and symptoms of COVID-19 were discussed with the patient and how to seek care for testing (follow up with PCP or arrange E-visit).  The importance of social distancing was discussed today.  Patient Risk:   After full review of this patient's clinical status, I feel that they are at least moderate risk at this time.  Time:   Today, I have spent 25 minutes on telemedicine discussing medical problems including PVCs, PACs, sleep apnea.  We also reviewed the symptoms of COVID 19 and the ways to protect against contracting the virus with telehealth technology.  I spent an additional 5 minutes reviewing patient's chart including sleep study and OV notes from PCP and Neurology.  Medication Adjustments/Labs and Tests Ordered: Current medicines are reviewed at length with the patient today.  Concerns regarding medicines are outlined above.  Tests  Ordered: No orders of the defined types were placed in this encounter.  Medication Changes: No orders of the defined types were placed in this encounter.   Disposition:  Follow up PRN  Signed, Fransico Him, MD  06/09/2019 3:06 PM    [place]

## 2019-06-09 NOTE — Progress Notes (Signed)
Virtual Visit via Telephone Note   This visit type was conducted due to national recommendations for restrictions regarding the COVID-19 Pandemic (e.g. social distancing) in an effort to limit this patient's exposure and mitigate transmission in our community.  Due to his co-morbid illnesses, this patient is at least at moderate risk for complications without adequate follow up.  This format is felt to be most appropriate for this patient at this time.  The patient did not have access to video technology/had technical difficulties with video requiring transitioning to audio format only (telephone).  All issues noted in this document were discussed and addressed.  No physical exam could be performed with this format.  Please refer to the patient's chart for his  consent to telehealth for [facility].   Evaluation Performed:  Cardiology Consult  This visit type was conducted due to national recommendations for restrictions regarding the COVID-19 Pandemic (e.g. social distancing).  This format is felt to be most appropriate for this patient at this time.  All issues noted in this document were discussed and addressed.  No physical exam was performed (except for noted visual exam findings with Video Visits).  Please refer to the patient's chart (MyChart message for video visits and phone note for telephone visits) for the patient's consent to telehealth for [facility].  Date:  06/09/2019   ID:  Calvin Escobar, DOB 1950/11/05, MRN [id number]  Patient Location:  Home  Provider location:   [place]  PCP: [name], MD Cardiologist:  Fransico Him, MD (NEW) Electrophysiologist:  None   Chief Complaint:  Palpitations  History of Present Illness:    Calvin Escobar is a 69 y.o. male who presents via audio/video conferencing for a telehealth visit today in referral by [name], MR and [name], MD.  This is a 69yo male with a hx of depression, ETOH abuse (sober 7 years), dementia and  HLD.  He recently had a sleep study done for severe daytime fatigue and was noted to have frequent PVCs and PACs on tele along with severe OSA with nocturnal hypoxemia as low as 55%>  He has been set up for CPAP but has not received his device yet.     He denies any chest pain or pressure, SOB, DOE, PND, orthopnea, LE edema, dizziness, palpitations or syncope. He is compliant with his meds and is tolerating meds with no SE.    The patient does not have symptoms concerning for COVID-19 infection (fever, chills, cough, or new shortness of breath).   Prior CV studies:   The following studies were reviewed today:  none  Past Medical History:  Diagnosis Date  . Allergy seasonal  . Cancer ([place]) skin  . Dementia ([place])   . Depression   . Hyperlipidemia    Past Surgical History:  Procedure Laterality Date  . COLONOSCOPY       Current Meds  Medication Sig  . pravastatin (PRAVACHOL) 20 MG tablet Take 20 mg by mouth daily.  . traZODone (DESYREL) 50 MG tablet Take 50 mg by mouth at bedtime as needed.      Allergies:   Patient has no known allergies.   Social History   Tobacco Use  . Smoking status: Never Smoker  . Smokeless tobacco: Never Used  Substance Use Topics  . Alcohol use: Yes    Alcohol/week: 14.0 standard drinks    Types: 14 Cans of beer per week  . Drug use: No     Family Hx: The patient's family  history is not on file.  ROS:   Please see the history of present illness.     All other systems reviewed and are negative.   Labs/Other Tests and Data Reviewed:    Recent Labs: No results found for requested labs within last 8760 hours.   Recent Lipid Panel No results found for: CHOL, TRIG, HDL, CHOLHDL, LDLCALC, LDLDIRECT  Wt Readings from Last 3 Encounters:  06/09/19 300 lb (136.1 kg)  02/01/16 224 lb 4.8 oz (101.7 kg)  09/15/10 (!) 290 lb (131.5 kg)     Objective:    Vital Signs:  Ht 6\' 1"  (1.854 m)   Wt 300 lb (136.1 kg)   BMI 39.58 kg/m     CONSTITUTIONAL:  Well nourished, well developed male in no acute distress.  EYES: anicteric MOUTH: oral mucosa is pink RESPIRATORY: Normal respiratory effort, symmetric expansion CARDIOVASCULAR: No peripheral edema SKIN: No rash, lesions or ulcers MUSCULOSKELETAL: no digital cyanosis NEURO: Cranial Nerves II-XII grossly intact, moves all extremities PSYCH: Intact judgement and insight.  A&O x 3, Mood/affect appropriate   ASSESSMENT & PLAN:    1.  PACs -likely triggered by severe OSA and hypoxemia -these were noted during sleep study -he is asymptomatic  2.  PVCs -again noted at the time of the sleep study and likely due to nocturnal hypoxemia -will get a 2 week Ziopatch to assess PVC load -if PVC load low then would not treat as he is asymptomatic -will wait until he is on CPAP to assess with the heart monitor. -2D echo to rule out structural heart disease -he has no anginal sx  3.  OSA with nocturnal hypoemia -this is followed by neurology at [facility] -he is getting set up with CPAP in the near furture  COVID-19 Education: The signs and symptoms of COVID-19 were discussed with the patient and how to seek care for testing (follow up with PCP or arrange E-visit).  The importance of social distancing was discussed today.  Patient Risk:   After full review of this patient's clinical status, I feel that they are at least moderate risk at this time.  Time:   Today, I have spent 25 minutes on telemedicine discussing medical problems including PVCs, PACs, sleep apnea.  We also reviewed the symptoms of COVID 19 and the ways to protect against contracting the virus with telehealth technology.  I spent an additional 5 minutes reviewing patient's chart including sleep study and OV notes from PCP and Neurology.  Medication Adjustments/Labs and Tests Ordered: Current medicines are reviewed at length with the patient today.  Concerns regarding medicines are outlined above.  Tests Ordered:  No orders of the defined types were placed in this encounter.  Medication Changes: No orders of the defined types were placed in this encounter.   Disposition:  Follow up PRN  Signed, Fransico Him, MD  06/09/2019 3:06 PM    [place]

## 2019-06-09 NOTE — Patient Instructions (Signed)
Medication Instructions:  Your physician recommends that you continue on your current medications as directed. Please refer to the Current Medication list given to you today.  *If you need a refill on your cardiac medications before your next appointment, please call your pharmacy*  Testing/Procedures: Your physician has requested that you have an echocardiogram. Echocardiography is a painless test that uses sound waves to create images of your heart. It provides your doctor with information about the size and shape of your heart and how well your heart's chambers and valves are working. This procedure takes approximately one hour. There are no restrictions for this procedure.  Your physician has recommended that you wear an event monitor. Event monitors are medical devices that record the heart's electrical activity. Doctors most often [place] these monitors to diagnose arrhythmias. Arrhythmias are problems with the speed or rhythm of the heartbeat. The monitor is a small, portable device. You can wear one while you do your normal daily activities. This is usually used to diagnose what is causing palpitations/syncope (passing out).  Follow-Up: At [facility], you and your health needs are our priority.  As part of our continuing mission to provide you with exceptional heart care, we have created designated Provider Care Teams.  These Care Teams include your primary Cardiologist (physician) and Advanced Practice Providers (APPs -  Physician Assistants and Nurse Practitioners) who all work together to provide you with the care you need, when you need it.  Your next appointment:   Follow-up with Dr. Radford Pax as needed, based on results of tests.

## 2019-06-10 ENCOUNTER — Telehealth: Payer: Self-pay | Admitting: *Deleted

## 2019-06-10 NOTE — Telephone Encounter (Signed)
14 day ZIO XT long term holter monitor to be mailed to the patients home.  Instructions reviewed briefly as they are included in the monitor kit. 

## 2019-06-12 ENCOUNTER — Other Ambulatory Visit (HOSPITAL_COMMUNITY): Payer: Medicare HMO

## 2019-06-15 ENCOUNTER — Other Ambulatory Visit (INDEPENDENT_AMBULATORY_CARE_PROVIDER_SITE_OTHER): Payer: Medicare HMO

## 2019-06-15 DIAGNOSIS — I493 Ventricular premature depolarization: Secondary | ICD-10-CM

## 2019-06-15 DIAGNOSIS — G4733 Obstructive sleep apnea (adult) (pediatric): Secondary | ICD-10-CM | POA: Diagnosis not present

## 2019-06-15 DIAGNOSIS — I491 Atrial premature depolarization: Secondary | ICD-10-CM | POA: Diagnosis not present

## 2019-06-22 ENCOUNTER — Other Ambulatory Visit: Payer: Self-pay

## 2019-06-22 ENCOUNTER — Encounter (HOSPITAL_COMMUNITY): Payer: Self-pay

## 2019-06-22 ENCOUNTER — Ambulatory Visit (HOSPITAL_COMMUNITY): Payer: Medicare HMO | Attending: Cardiovascular Disease

## 2019-06-22 DIAGNOSIS — I491 Atrial premature depolarization: Secondary | ICD-10-CM

## 2019-06-22 DIAGNOSIS — I493 Ventricular premature depolarization: Secondary | ICD-10-CM | POA: Insufficient documentation

## 2019-06-22 DIAGNOSIS — G4733 Obstructive sleep apnea (adult) (pediatric): Secondary | ICD-10-CM | POA: Insufficient documentation

## 2019-06-22 DIAGNOSIS — I371 Nonrheumatic pulmonary valve insufficiency: Secondary | ICD-10-CM | POA: Insufficient documentation

## 2019-06-22 DIAGNOSIS — E785 Hyperlipidemia, unspecified: Secondary | ICD-10-CM | POA: Insufficient documentation

## 2019-06-22 NOTE — Progress Notes (Signed)
Mr. Prairie presented for echocardiogram this morning referred by Dr. Radford Pax for PAC's, PVC's and sleep apnea. EF was depressed without significant distress. DOD (Dr. Harrington Challenger) was notified and advised to have Dr. Radford Pax follow up and give Dr. Radford Pax a heads up. Patient was stable to be discharged home.  Calvin Escobar, RDCS

## 2019-06-26 ENCOUNTER — Telehealth: Payer: Self-pay

## 2019-06-26 DIAGNOSIS — I491 Atrial premature depolarization: Secondary | ICD-10-CM

## 2019-06-26 DIAGNOSIS — R9430 Abnormal result of cardiovascular function study, unspecified: Secondary | ICD-10-CM

## 2019-06-26 DIAGNOSIS — I493 Ventricular premature depolarization: Secondary | ICD-10-CM

## 2019-06-26 NOTE — Telephone Encounter (Signed)
-----   Message from Dollene Primrose, RN sent at 06/24/2019  8:32 AM EST -----  ----- Message ----- From: Sueanne Margarita, MD Sent: 06/23/2019   6:17 PM EST To: [facility] Triage  2D echo showed severe LV dysfunction with EF 25-30% with mildly enlarged RV with mildly reduced RVF, moderately enlarged LA and mildly leaky pulmonic valve. Unclear etiology of DCM - he has a hx of ETOH use in the past.  Also has had PVCs on EKG.  Please make sure that patient has been scheduled for his ziopatch that was ordered recently.  Please set him up for right and left heart cath by Dr. Aundra Dubin or Dr. Haroldine Laws.  His last OV with PCP his BP was soft so unsure he will be able to tolerate HF meds.  Please have him set up for an appt with advanced HF clinic given low EF and soft BP after his cath.

## 2019-06-26 NOTE — Telephone Encounter (Signed)
Patient's wife returning call. 

## 2019-06-26 NOTE — Telephone Encounter (Signed)
Left message for wife to call back.

## 2019-06-26 NOTE — Telephone Encounter (Signed)
Spoke with patient's wife and made her away of results. Also advised her on the need for a cardiac catheterization. She verbalized understanding. Lab work and EKG have been scheduled for next week.

## 2019-06-26 NOTE — Telephone Encounter (Signed)
Patient has been notified of results. Advised him on the need for a cardiac catheterization. Patient asked me to call his wife to discuss further. Left message with wife to call back.

## 2019-06-30 ENCOUNTER — Other Ambulatory Visit: Payer: Self-pay

## 2019-06-30 ENCOUNTER — Ambulatory Visit (INDEPENDENT_AMBULATORY_CARE_PROVIDER_SITE_OTHER): Payer: Medicare HMO

## 2019-06-30 DIAGNOSIS — I491 Atrial premature depolarization: Secondary | ICD-10-CM

## 2019-06-30 DIAGNOSIS — I493 Ventricular premature depolarization: Secondary | ICD-10-CM | POA: Diagnosis not present

## 2019-06-30 NOTE — Progress Notes (Signed)
1.) Reason for visit: Pre-cath EKG  2.) Name of MD requesting visit: Dr. Fransico Him  3.) Assessment and plan per MD: EKG reviewed by DOD, Dr. Tamala Julian. Normal sinus rhythm, no changes from prior 01/13/19

## 2019-06-30 NOTE — Patient Instructions (Signed)
Medication Instructions:  Your physician recommends that you continue on your current medications as directed. Please refer to the Current Medication list given to you today.  *If you need a refill on your cardiac medications before your next appointment, please call your pharmacy*  Lab Work: TODAY: BMET If you have labs (blood work) drawn today and your tests are completely normal, you will receive your results only by: Marland Kitchen MyChart Message (if you have MyChart) OR . A paper copy in the mail If you have any lab test that is abnormal or we need to change your treatment, we will call you to review the results.  Follow-Up: At [facility], you and your health needs are our priority.  As part of our continuing mission to provide you with exceptional heart care, we have created designated Provider Care Teams.  These Care Teams include your primary Cardiologist (physician) and Advanced Practice Providers (APPs -  Physician Assistants and Nurse Practitioners) who all work together to provide you with the care you need, when you need it.

## 2019-07-01 ENCOUNTER — Other Ambulatory Visit (HOSPITAL_COMMUNITY)
Admission: RE | Admit: 2019-07-01 | Discharge: 2019-07-01 | Disposition: A | Discharge status: Home / Self Care | Payer: Medicare HMO | Source: Ambulatory Visit | Attending: Cardiovascular Disease | Admitting: Cardiovascular Disease

## 2019-07-01 DIAGNOSIS — Z01812 Encounter for preprocedural laboratory examination: Secondary | ICD-10-CM | POA: Insufficient documentation

## 2019-07-01 DIAGNOSIS — Z20822 Contact with and (suspected) exposure to covid-19: Secondary | ICD-10-CM | POA: Insufficient documentation

## 2019-07-01 LAB — BASIC METABOLIC PANEL
BUN/Creatinine Ratio: 11 (ref 10–24)
BUN: 14 mg/dL (ref 8–27)
CO2: 22 mmol/L (ref 20–29)
Calcium: 9.1 mg/dL (ref 8.6–10.2)
Chloride: 107 mmol/L — ABNORMAL HIGH (ref 96–106)
Creatinine, Ser: 1.22 mg/dL (ref 0.76–1.27)
GFR calc Af Amer: 69 mL/min/{1.73_m2} (ref >59)
GFR calc non Af Amer: 60 mL/min/{1.73_m2} (ref >59)
Glucose: 121 mg/dL — ABNORMAL HIGH (ref 65–99)
Potassium: 4.5 mmol/L (ref 3.5–5.2)
Sodium: 144 mmol/L (ref 134–144)

## 2019-07-01 LAB — SARS CORONAVIRUS 2 (TAT 6-24 HRS): SARS Coronavirus 2: NEGATIVE

## 2019-07-01 NOTE — Addendum Note (Signed)
Addended by: Antonieta Iba on: 07/01/2019 01:53 PM   Modules accepted: Orders

## 2019-07-02 ENCOUNTER — Telehealth: Payer: Self-pay | Admitting: *Deleted

## 2019-07-02 NOTE — Telephone Encounter (Addendum)
Pt contacted pre-catheterization scheduled at [facility] for: Friday July 03, 2019 10:30 AM Verified arrival time and place: [facility]) at: 8:30 AM   No solid food after midnight prior to cath, clear liquids until 5 AM day of procedure. Contrast allergy: no  AM meds can be  taken pre-cath with sip of water including: ASA 81 mg   Confirmed patient has responsible adult to drive home post procedure and observe 24 hours after arriving home:  yes  Currently, due to Covid-19 pandemic, only one person will be allowed with patient. Must be the same person for patient's entire stay and will be required to wear a mask. They will be asked to wait in the waiting room for the duration of the patient's stay.  Patients are required to wear a mask when they enter the hospital.      COVID-19 Pre-Screening Questions:  . In the past 7 to 10 days have you had a cough,  shortness of breath, headache, congestion, fever (100 or greater) body aches, chills, sore throat, or sudden loss of taste or sense of smell? no  I reviewed procedure/mask/visitor instructions with patient's wife, Haynes Dage, she verbalized understanding, thanked me for call.

## 2019-07-03 ENCOUNTER — Encounter (HOSPITAL_COMMUNITY)
Admission: RE | Disposition: A | Discharge status: Home / Self Care | Payer: Self-pay | Source: Home / Self Care | Attending: Cardiovascular Disease

## 2019-07-03 ENCOUNTER — Ambulatory Visit (HOSPITAL_COMMUNITY)
Admission: RE | Admit: 2019-07-03 | Discharge: 2019-07-03 | Disposition: A | Discharge status: Home / Self Care | Payer: Medicare HMO | Attending: Cardiovascular Disease | Admitting: Cardiovascular Disease

## 2019-07-03 ENCOUNTER — Other Ambulatory Visit: Payer: Self-pay

## 2019-07-03 DIAGNOSIS — G4733 Obstructive sleep apnea (adult) (pediatric): Secondary | ICD-10-CM | POA: Insufficient documentation

## 2019-07-03 DIAGNOSIS — I428 Other cardiomyopathies: Secondary | ICD-10-CM | POA: Insufficient documentation

## 2019-07-03 DIAGNOSIS — R002 Palpitations: Secondary | ICD-10-CM | POA: Insufficient documentation

## 2019-07-03 DIAGNOSIS — E785 Hyperlipidemia, unspecified: Secondary | ICD-10-CM | POA: Diagnosis not present

## 2019-07-03 DIAGNOSIS — I5021 Acute systolic (congestive) heart failure: Secondary | ICD-10-CM | POA: Diagnosis present

## 2019-07-03 DIAGNOSIS — I493 Ventricular premature depolarization: Secondary | ICD-10-CM | POA: Diagnosis not present

## 2019-07-03 DIAGNOSIS — F0390 Unspecified dementia without behavioral disturbance: Secondary | ICD-10-CM | POA: Insufficient documentation

## 2019-07-03 DIAGNOSIS — Z79899 Other long term (current) drug therapy: Secondary | ICD-10-CM | POA: Diagnosis not present

## 2019-07-03 HISTORY — PX: RIGHT/LEFT HEART CATH AND CORONARY ANGIOGRAPHY: CATH118266

## 2019-07-03 LAB — POCT I-STAT EG7
Acid-Base Excess: 1 mmol/L (ref 0.0–2.0)
Bicarbonate: 27 mmol/L (ref 20.0–28.0)
Calcium, Ion: 1.2 mmol/L (ref 1.15–1.40)
HCT: 40 % (ref 39.0–52.0)
Hemoglobin: 13.6 g/dL (ref 13.0–17.0)
O2 Saturation: 71 %
Potassium: 4.2 mmol/L (ref 3.5–5.1)
Sodium: 142 mmol/L (ref 135–145)
TCO2: 28 mmol/L (ref 22–32)
pCO2, Ven: 45.1 mmHg (ref 44.0–60.0)
pH, Ven: 7.385 (ref 7.250–7.430)
pO2, Ven: 38 mmHg (ref 32.0–45.0)

## 2019-07-03 LAB — CBC
HCT: 44.4 % (ref 39.0–52.0)
Hemoglobin: 14.3 g/dL (ref 13.0–17.0)
MCH: 29.8 pg (ref 26.0–34.0)
MCHC: 32.2 g/dL (ref 30.0–36.0)
MCV: 92.5 fL (ref 80.0–100.0)
Platelets: 202 10*3/uL (ref 150–400)
RBC: 4.8 MIL/uL (ref 4.22–5.81)
RDW: 12.9 % (ref 11.5–15.5)
WBC: 7.9 10*3/uL (ref 4.0–10.5)
nRBC: 0 % (ref 0.0–0.2)

## 2019-07-03 LAB — POCT I-STAT 7, (LYTES, BLD GAS, ICA,H+H)
Acid-Base Excess: 3 mmol/L — ABNORMAL HIGH (ref 0.0–2.0)
Bicarbonate: 27.3 mmol/L (ref 20.0–28.0)
Calcium, Ion: 1.18 mmol/L (ref 1.15–1.40)
HCT: 39 % (ref 39.0–52.0)
Hemoglobin: 13.3 g/dL (ref 13.0–17.0)
O2 Saturation: 99 %
Potassium: 4.5 mmol/L (ref 3.5–5.1)
Sodium: 139 mmol/L (ref 135–145)
TCO2: 29 mmol/L (ref 22–32)
pCO2 arterial: 41.7 mmHg (ref 32.0–48.0)
pH, Arterial: 7.423 (ref 7.350–7.450)
pO2, Arterial: 120 mmHg — ABNORMAL HIGH (ref 83.0–108.0)

## 2019-07-03 LAB — POCT ACTIVATED CLOTTING TIME: Activated Clotting Time: 114 seconds

## 2019-07-03 SURGERY — RIGHT/LEFT HEART CATH AND CORONARY ANGIOGRAPHY
Anesthesia: LOCAL

## 2019-07-03 MED ORDER — FENTANYL CITRATE (PF) 100 MCG/2ML IJ SOLN
INTRAMUSCULAR | Status: AC
  Filled 2019-07-03: qty 2

## 2019-07-03 MED ORDER — HEPARIN (PORCINE) IN NACL 1000-0.9 UT/500ML-% IV SOLN
INTRAVENOUS | Status: AC
  Filled 2019-07-03: qty 1000

## 2019-07-03 MED ORDER — SODIUM CHLORIDE 0.9 % IV SOLN: INTRAVENOUS | Status: DC

## 2019-07-03 MED ORDER — VERAPAMIL HCL 2.5 MG/ML IV SOLN
INTRAVENOUS | Status: DC | PRN
  Administered 2019-07-03: 10 mL via INTRA_ARTERIAL

## 2019-07-03 MED ORDER — LIDOCAINE HCL (PF) 1 % IJ SOLN
INTRAMUSCULAR | Status: DC | PRN
  Administered 2019-07-03 (×2): 2 mL

## 2019-07-03 MED ORDER — HEPARIN SODIUM (PORCINE) 1000 UNIT/ML IJ SOLN
INTRAMUSCULAR | Status: AC
  Filled 2019-07-03: qty 1

## 2019-07-03 MED ORDER — LIDOCAINE HCL (PF) 1 % IJ SOLN
INTRAMUSCULAR | Status: AC
  Filled 2019-07-03: qty 30

## 2019-07-03 MED ORDER — HEPARIN SODIUM (PORCINE) 1000 UNIT/ML IJ SOLN
INTRAMUSCULAR | Status: DC | PRN
  Administered 2019-07-03: 6000 [IU] via INTRAVENOUS

## 2019-07-03 MED ORDER — MIDAZOLAM HCL 2 MG/2ML IJ SOLN
INTRAMUSCULAR | Status: DC | PRN
  Administered 2019-07-03: 1 mg via INTRAVENOUS

## 2019-07-03 MED ORDER — SODIUM CHLORIDE 0.9% FLUSH: 3.0000 mL | Freq: Two times a day (BID) | INTRAVENOUS | Status: DC

## 2019-07-03 MED ORDER — VERAPAMIL HCL 2.5 MG/ML IV SOLN
INTRAVENOUS | Status: AC
  Filled 2019-07-03: qty 2

## 2019-07-03 MED ORDER — HEPARIN (PORCINE) IN NACL 1000-0.9 UT/500ML-% IV SOLN
INTRAVENOUS | Status: DC | PRN
  Administered 2019-07-03 (×2): 500 mL

## 2019-07-03 MED ORDER — MIDAZOLAM HCL 2 MG/2ML IJ SOLN
INTRAMUSCULAR | Status: AC
  Filled 2019-07-03: qty 2

## 2019-07-03 MED ORDER — SODIUM CHLORIDE 0.9 % IV SOLN: 250.0000 mL | INTRAVENOUS | Status: DC | PRN

## 2019-07-03 MED ORDER — IOHEXOL 350 MG/ML SOLN
INTRAVENOUS | Status: DC | PRN
  Administered 2019-07-03: 85 mL via INTRA_ARTERIAL

## 2019-07-03 MED ORDER — SODIUM CHLORIDE 0.9% FLUSH: 3.0000 mL | INTRAVENOUS | Status: DC | PRN

## 2019-07-03 MED ORDER — ASPIRIN 81 MG PO CHEW: 81.0000 mg | CHEWABLE_TABLET | ORAL | Status: DC

## 2019-07-03 MED ORDER — FENTANYL CITRATE (PF) 100 MCG/2ML IJ SOLN
INTRAMUSCULAR | Status: DC | PRN
  Administered 2019-07-03: 25 ug via INTRAVENOUS

## 2019-07-03 SURGICAL SUPPLY — 12 items
CATH 5FR JL3.5 JR4 ANG PIG MP (CATHETERS) ×1 IMPLANT
CATH BALLN WEDGE 5F 110CM (CATHETERS) ×1 IMPLANT
DEVICE RAD COMP TR BAND LRG (VASCULAR PRODUCTS) ×1 IMPLANT
GLIDESHEATH SLEND SS 6F .021 (SHEATH) ×1 IMPLANT
GUIDEWIRE INQWIRE 1.5J.035X260 (WIRE) IMPLANT
INQWIRE 1.5J .035X260CM (WIRE) ×2
KIT HEART LEFT (KITS) ×2 IMPLANT
PACK CARDIAC CATHETERIZATION (CUSTOM PROCEDURE TRAY) ×2 IMPLANT
SHEATH GLIDE SLENDER 4/5FR (SHEATH) ×1 IMPLANT
SYR MEDRAD MARK 7 150ML (SYRINGE) ×2 IMPLANT
TRANSDUCER W/STOPCOCK (MISCELLANEOUS) ×2 IMPLANT
TUBING CIL FLEX 10 FLL-RA (TUBING) ×2 IMPLANT

## 2019-07-03 NOTE — Interval H&P Note (Signed)
History and Physical Interval Note:  07/03/2019 11:36 AM  Calvin Escobar  has presented today for surgery, with the diagnosis of cardiomyopathy.  The various methods of treatment have been discussed with the patient and family. After consideration of risks, benefits and other options for treatment, the patient has consented to  Procedure(s): RIGHT/LEFT HEART CATH AND CORONARY ANGIOGRAPHY (N/A) and possible coronary angioplasty as a surgical intervention.  The patient's history has been reviewed, patient examined, no change in status, stable for surgery.  I have reviewed the patient's chart and labs.  Questions were answered to the patient's satisfaction.     [name]

## 2019-07-03 NOTE — Discharge Instructions (Signed)
DRINK PLENTY OF FLUIDS FOR THE NEXT 2-3 DAYS.  KEEP ARM ELEVATED THE REMAINDER OF THE DAY.  Radial Site Care  This sheet gives you information about how to care for yourself after your procedure. Your health care provider may also give you more specific instructions. If you have problems or questions, contact your health care provider. What can I expect after the procedure? After the procedure, it is common to have:  Bruising and tenderness at the catheter insertion area. Follow these instructions at home: Medicines  Take over-the-counter and prescription medicines only as told by your health care provider. Insertion site care 1. Follow instructions from your health care provider about how to take care of your insertion site. Make sure you: ? Wash your hands with soap and water before you change your bandage (dressing). If soap and water are not available, use hand sanitizer. ? Change your dressing as told by your health care provider. 2. Check your insertion site every day for signs of infection. Check for: ? Redness, swelling, or pain. ? Fluid or blood. ? Pus or a bad smell. ? Warmth. 3. Do not take baths, swim, or use a hot tub for 5 days. 4. You may shower 24-48 hours after the procedure. ? Remove the dressing and gently wash the site with plain soap and water. ? Pat the area dry with a clean towel. ? Do not rub the site. That could cause bleeding. 5. Do not apply powder or lotion to the site. Activity  1. For 24 hours after the procedure, or as directed by your health care provider: ? Do not flex or bend the affected arm. ? Do not push or pull heavy objects with the affected arm. ? Do not drive yourself home from the hospital or clinic. You may drive 24 hours after the procedure. ? Do not operate machinery or power tools. 2. Do not lift anything that is heavier than 10 lb for 5 days. 3. Ask your health care provider when it is okay to: ? Return to work or school. ? Resume  usual physical activities or sports. ? Resume sexual activity. General instructions  If the catheter site starts to bleed, raise your arm and put firm pressure on the site. If the bleeding does not stop, get help right away. This is a medical emergency.  If you went home on the same day as your procedure, a responsible adult should be with you for the first 24 hours after you arrive home.  Keep all follow-up visits as told by your health care provider. This is important. Contact a health care provider if:  You have a fever.  You have redness, swelling, or yellow drainage around your insertion site. Get help right away if:  You have unusual pain at the radial site.  The catheter insertion area swells very fast.  The insertion area is bleeding, and the bleeding does not stop when you hold steady pressure on the area.  Your arm or hand becomes pale, cool, tingly, or numb. These symptoms may represent a serious problem that is an emergency. Do not wait to see if the symptoms will go away. Get medical help right away. Call your local emergency services (911 in the [place] Do not drive yourself to the hospital. Summary  After the procedure, it is common to have bruising and tenderness at the site.  Follow instructions from your health care provider about how to take care of your radial site wound. Check the wound every  day for signs of infection.  Do not lift anything that is heavier than 10 lb for 5 days.  This information is not intended to replace advice given to you by your health care provider. Make sure you discuss any questions you have with your health care provider. Document Revised: 06/26/2017 Document Reviewed: 06/26/2017 Elsevier Patient Education  2020 [employer].

## 2019-07-07 ENCOUNTER — Other Ambulatory Visit: Payer: Self-pay

## 2019-07-07 ENCOUNTER — Encounter: Payer: Self-pay | Admitting: Cardiology

## 2019-07-07 ENCOUNTER — Telehealth (INDEPENDENT_AMBULATORY_CARE_PROVIDER_SITE_OTHER): Payer: Medicare HMO | Admitting: Cardiology

## 2019-07-07 VITALS — Ht 73.0 in | Wt 290.0 lb

## 2019-07-07 DIAGNOSIS — I420 Dilated cardiomyopathy: Secondary | ICD-10-CM | POA: Diagnosis not present

## 2019-07-07 DIAGNOSIS — G4733 Obstructive sleep apnea (adult) (pediatric): Secondary | ICD-10-CM

## 2019-07-07 DIAGNOSIS — E7800 Pure hypercholesterolemia, unspecified: Secondary | ICD-10-CM

## 2019-07-07 MED ORDER — ENTRESTO 24-26 MG PO TABS: 1.0000 | ORAL_TABLET | Freq: Two times a day (BID) | ORAL | 3 refills | Status: DC

## 2019-07-07 MED ORDER — SPIRONOLACTONE 25 MG PO TABS: 12.5000 mg | ORAL_TABLET | Freq: Once | ORAL | 3 refills | Status: DC

## 2019-07-07 NOTE — Patient Instructions (Signed)
Medication Instructions:  Your physician has recommended you make the following change in your medication: 1) START Entresto 24-26 mg twice daily 2) START spironolactone 12.5 mg daily  *If you need a refill on your cardiac medications before your next appointment, please call your pharmacy*  Follow-Up: At [facility], you and your health needs are our priority.  As part of our continuing mission to provide you with exceptional heart care, we have created designated Provider Care Teams.  These Care Teams include your primary Cardiologist (physician) and Advanced Practice Providers (APPs -  Physician Assistants and Nurse Practitioners) who all work together to provide you with the care you need, when you need it.  Your next appointment:   Wednesday, February 17th at 3:45PM  The format for your next appointment:   In Person  Provider:   Richardson Dopp, PA-C

## 2019-07-07 NOTE — Progress Notes (Signed)
Virtual Visit via Telephone Note   This visit type was conducted due to national recommendations for restrictions regarding the COVID-19 Pandemic (e.g. social distancing) in an effort to limit this patient's exposure and mitigate transmission in our community.  Due to his co-morbid illnesses, this patient is at least at moderate risk for complications without adequate follow up.  This format is felt to be most appropriate for this patient at this time.  The patient did not have access to video technology/had technical difficulties with video requiring transitioning to audio format only (telephone).  All issues noted in this document were discussed and addressed.  No physical exam could be performed with this format.  Please refer to the patient's chart for his  consent to telehealth for [facility].   Evaluation Performed:  Follow-up visit  This visit type was conducted due to national recommendations for restrictions regarding the COVID-19 Pandemic (e.g. social distancing).  This format is felt to be most appropriate for this patient at this time.  All issues noted in this document were discussed and addressed.  No physical exam was performed (except for noted visual exam findings with Video Visits).  Please refer to the patient's chart (MyChart message for video visits and phone note for telephone visits) for the patient's consent to telehealth for [facility].  Date:  07/07/2019   ID:  Calvin Escobar, DOB [birth year], MRN [id number]  Patient Location:  Home  Provider location:   [place]  PCP:  [name], MD  Cardiologist:  Fransico Him, MD  Electrophysiologist:  None   Chief Complaint:  DCM  History of Present Illness:    Calvin Escobar is a 69 y.o. male who presents via audio/video conferencing for a telehealth visit today.    [name], [name] who was recently diagnosed with a DCM.  He recently underwent right and left heart  cath showing normal coronary arteries with normal hemodynamics and EF 25-30%.   He is here today for followup and is doing well.  He denies any chest pain or pressure, SOB, DOE, PND, orthopnea, LE edema, dizziness, palpitations or syncope. He is compliant with his meds and is tolerating meds with no SE.  He has OSA on PAP that is followed by Neurology.    The patient does not have symptoms concerning for COVID-19 infection (fever, chills, cough, or new shortness of breath).    Prior CV studies:   The following studies were reviewed today:  Cardiac Cath  Past Medical History:  Diagnosis Date  . Allergy seasonal  . Cancer ([place]) skin  . Dementia ([place])   . Depression   . Hyperlipidemia    Past Surgical History:  Procedure Laterality Date  . COLONOSCOPY    . RIGHT/LEFT HEART CATH AND CORONARY ANGIOGRAPHY N/A 07/03/2019   Procedure: RIGHT/LEFT HEART CATH AND CORONARY ANGIOGRAPHY;  Surgeon: Jolaine Artist, MD;  Location: [facility];  Service: Cardiovascular;  Laterality: N/A;     Current Meds  Medication Sig  . calcium carbonate (OSCAL) 1500 (600 Ca) MG TABS tablet Take 600 mg of elemental calcium by mouth daily.  Marland Kitchen LORazepam (ATIVAN) 0.5 MG tablet Take 0.5 mg by mouth 2 (two) times daily.  . Multiple Vitamins-Minerals (MULTIVITAMIN WITH MINERALS) tablet Take 1 tablet by mouth daily.  . pravastatin (PRAVACHOL) 20 MG tablet Take 20 mg by mouth at bedtime.   . traZODone (DESYREL) 50 MG tablet Take 50  mg by mouth at bedtime as needed for sleep.      Allergies:   Patient has no known allergies.   Social History   Tobacco Use  . Smoking status: Never Smoker  . Smokeless tobacco: Never Used  Substance Use Topics  . Alcohol use: Yes    Alcohol/week: 14.0 standard drinks    Types: 14 Cans of beer per week  . Drug use: No     Family Hx: The patient's family history includes Bone cancer in his sister; Cancer in his father; Hypertension in his mother.  ROS:   Please see  the history of present illness.     All other systems reviewed and are negative.   Labs/Other Tests and Data Reviewed:    Recent Labs: 06/30/2019: BUN 14; Creatinine, Ser 1.22 07/03/2019: Hemoglobin 13.3; Platelets 202; Potassium 4.5; Sodium 139   Recent Lipid Panel No results found for: CHOL, TRIG, HDL, CHOLHDL, LDLCALC, LDLDIRECT  Wt Readings from Last 3 Encounters:  07/07/19 290 lb (131.5 kg)  07/03/19 295 lb (133.8 kg)  06/30/19 295 lb (133.8 kg)     Objective:    Vital Signs:  Ht 6\' 1"  (1.854 m)   Wt 290 lb (131.5 kg)   BMI 38.26 kg/m     ASSESSMENT & PLAN:    1.  Nonischemic dilated cardiomyopathy -cardiac cath showed normal coronary arteries and normal hemodynamics -nonischemic in origin - felt to be related to OSA by Dr. Haroldine Laws who did the cath -Start Entresto 24-26mg  BID and spiro 12.5mg  daily.   -encouraged him to weigh daily and call if he gains >3lbs in 1 day or 5lbs in 1 week -follow < 2gm Na diet  -followup in 2 weeks with PA in office for uptitration of HF meds  2.  HLD -followed by PCP -continue pravastatin 20mg  daily  3.  OSA - He is followed by Neuro at [facility] for his BiPAP.  He is calling them today to get the pressure on his device increased.  He is getting more used to the device.   4.  Morbid Obesity  -BMI 38 with comorbidities -I have encouraged him to get into a routine exercise program and cut back on carbs and portions.   COVID-19 Education: The signs and symptoms of COVID-19 were discussed with the patient and how to seek care for testing (follow up with PCP or arrange E-visit).  The importance of social distancing was discussed today.  Patient Risk:   After full review of this patient's clinical status, I feel that they are at least moderate risk at this time.  Time:   Today, I have spent 20 minutes directly with the patient on telemedicine discussing medical problems including DCM, HLD.  We also reviewed the symptoms of COVID 19 and  the ways to protect against contracting the virus with telehealth technology.  I spent an additional 10 minutes reviewing patient's chart including cardiac cath, 2D echo, labs, sleep study  Medication Adjustments/Labs and Tests Ordered: Current medicines are reviewed at length with the patient today.  Concerns regarding medicines are outlined above.  Tests Ordered: No orders of the defined types were placed in this encounter.  Medication Changes: No orders of the defined types were placed in this encounter.   Disposition:  Follow up in 2 week(s) with extender to titrate HF meds  Signed, Fransico Him, MD  07/07/2019 9:33 AM    [place]

## 2019-07-13 ENCOUNTER — Telehealth: Payer: Self-pay

## 2019-07-13 MED ORDER — METOPROLOL SUCCINATE ER 25 MG PO TB24: 25.0000 mg | ORAL_TABLET | Freq: Every day | ORAL | 3 refills | Status: DC

## 2019-07-13 NOTE — Telephone Encounter (Signed)
-----   Message from Sueanne Margarita, MD sent at 07/12/2019 10:14 PM EST ----- Please let patient know that heart monitor showed extra heart beats from the top and bottom of the heart.  Add Toprol XL 25mg  daily and followup with PA in 2 weeks in office

## 2019-07-16 ENCOUNTER — Encounter: Payer: Self-pay | Admitting: Cardiology

## 2019-07-16 DIAGNOSIS — I420 Dilated cardiomyopathy: Secondary | ICD-10-CM | POA: Insufficient documentation

## 2019-07-16 DIAGNOSIS — G4733 Obstructive sleep apnea (adult) (pediatric): Secondary | ICD-10-CM | POA: Insufficient documentation

## 2019-07-16 NOTE — Progress Notes (Signed)
Virtual Visit via Telephone Note   This visit type was conducted due to national recommendations for restrictions regarding the COVID-19 Pandemic (e.g. social distancing) in an effort to limit this patient's exposure and mitigate transmission in our community.  Due to his co-morbid illnesses, this patient is at least at moderate risk for complications without adequate follow up.  This format is felt to be most appropriate for this patient at this time.  All issues noted in this document were discussed and addressed.  A limited physical exam was performed with this format.  Please refer to the patient's chart for his consent to telehealth for [facility].   Evaluation Performed:  Follow-up visit  This visit type was conducted due to national recommendations for restrictions regarding the COVID-19 Pandemic (e.g. social distancing).  This format is felt to be most appropriate for this patient at this time.  All issues noted in this document were discussed and addressed.  No physical exam was performed (except for noted visual exam findings with Video Visits).  Please refer to the patient's chart (MyChart message for video visits and phone note for telephone visits) for the patient's consent to telehealth for [facility].  Date:  07/17/2019   ID:  Calvin Escobar, DOB 03/19/1951, MRN [id number]  Patient Location:  Home  Provider location:   [place]  PCP:  [name], MD  Cardiologist:  Fransico Him, MD  Electrophysiologist:  None   Chief Complaint:  DCM  History of Present Illness:    Calvin Escobar is a 69 y.o. male who presents via audio/video conferencing for a telehealth visit today.    [name], [name] who was recently diagnosed with a DCM.  He recently underwent right and left heart cath showing normal coronary arteries with normal hemodynamics and EF 25-30%.  He was started on standard guideline directed therapy for HF and is  now back for uptitration of HF meds.   He is here today for followup and is doing well.  He denies any chest pain or pressure, SOB, DOE, PND, orthopnea, LE edema, dizziness, palpitations or syncope. He is compliant with his meds and is tolerating meds with no SE.  He is tolerating the Toprol and Entresto with no problems. He is doing well with his CPAP device and thinks that he has gotten used to it.  He tolerates the mask and feels the pressure is adequate.  Since going on CPAP he feels rested in the am and has no significant daytime sleepiness.  He denies any significant mouth or nasal dryness or nasal congestion.  He does not think that he snores.    The patient does not have symptoms concerning for COVID-19 infection (fever, chills, cough, or new shortness of breath).   Prior CV studies:   The following studies were reviewed today:  Outside labs from [facility] and recent cardiac cath  Past Medical History:  Diagnosis Date  . Allergy seasonal  . Cancer ([employer]) skin  . DCM (dilated cardiomyopathy) ([place])    nonischemic with normal coronary arteries at cath 06/2019  . Dementia ([place])   . Depression   . Hyperlipidemia   . OSA treated with BiPAP    Past Surgical History:  Procedure Laterality Date  . COLONOSCOPY    . RIGHT/LEFT HEART CATH AND CORONARY ANGIOGRAPHY N/A 07/03/2019   Procedure: RIGHT/LEFT HEART CATH AND CORONARY ANGIOGRAPHY;  Surgeon: Jolaine Artist, MD;  Location: [facility]  INVASIVE CV LAB;  Service: Cardiovascular;  Laterality: N/A;     Current Meds  Medication Sig  . calcium carbonate (OSCAL) 1500 (600 Ca) MG TABS tablet Take 600 mg of elemental calcium by mouth daily.  Marland Kitchen LORazepam (ATIVAN) 0.5 MG tablet Take 0.5 mg by mouth 2 (two) times daily.  . metoprolol succinate (TOPROL XL) 25 MG 24 hr tablet Take 1 tablet (25 mg total) by mouth daily.  . Multiple Vitamins-Minerals (MULTIVITAMIN WITH MINERALS) tablet Take 1 tablet by mouth daily.  . pravastatin (PRAVACHOL) 20 MG tablet Take  20 mg by mouth at bedtime.   . sacubitril-valsartan (ENTRESTO) 24-26 MG Take 1 tablet by mouth 2 (two) times daily.  . traZODone (DESYREL) 50 MG tablet Take 50 mg by mouth at bedtime as needed for sleep.      Allergies:   Patient has no known allergies.   Social History   Tobacco Use  . Smoking status: Never Smoker  . Smokeless tobacco: Never Used  Substance Use Topics  . Alcohol use: Yes    Alcohol/week: 14.0 standard drinks    Types: 14 Cans of beer per week  . Drug use: No     Family Hx: The patient's family history includes Bone cancer in his sister; Cancer in his father; Hypertension in his mother.  ROS:   Please see the history of present illness.     All other systems reviewed and are negative.   Labs/Other Tests and Data Reviewed:    Recent Labs: 06/30/2019: BUN 14; Creatinine, Ser 1.22 07/03/2019: Hemoglobin 13.3; Platelets 202; Potassium 4.5; Sodium 139   Recent Lipid Panel No results found for: CHOL, TRIG, HDL, CHOLHDL, LDLCALC, LDLDIRECT  Wt Readings from Last 3 Encounters:  07/17/19 289 lb (131.1 kg)  07/07/19 290 lb (131.5 kg)  07/03/19 295 lb (133.8 kg)     Objective:    Vital Signs:  Ht 6\' 1"  (1.854 m)   Wt 289 lb (131.1 kg)   BMI 38.13 kg/m    ASSESSMENT & PLAN:    1.  Nonischemic DM -cardiac cath showed normal coronary arteries and normal hemodynamics -nonischemic in origin - felt to be related to OSA by Dr. Haroldine Laws who did the cath -started on Entresto, spiro and BB -creatinine stable at 1.22 and K+ 4.5 on labs 06/30/2019 -continue spiro 12.5mg  daily -continue to follow < 2gm Na diet and < 2L fluid restriction daily -continue to weigh daily and call for wt gain > 3lbs in 1 day or 5lbs in 1 week  -Bp was checked at his PCP yesterday and was 130/67mmHg  -will increase Entresto to 49-51mg  BID -check BMET in 1 week  -followup with 2 weeks later for further uptitration of HF meds   -I will see what we can do about getting a BP cuff  2.   HLD -LDL goal < 70 -LDL was  -continue pravastatin 20mg  daily  3.  OSA -  The patient is tolerating PAP therapy well without any problems. The patient has been using and benefiting from PAP use and will continue to benefit from therapy. PAP is followed by Neuro  4.  Morbid Obesity -I have encouraged him to get into a routine exercise program and cut back on carbs and portions.   COVID-19 Education: The signs and symptoms of COVID-19 were discussed with the patient and how to seek care for testing (follow up with PCP or arrange E-visit).  The importance of social distancing was discussed today.  Patient Risk:  After full review of this patient's clinical status, I feel that they are at least moderate risk at this time.  Time:   Today, I have spent 20 minutes on telemedicine discussing medical problems including DCM, OSA, HLD and reviewing patient's chart including outside labs from [facility].  Medication Adjustments/Labs and Tests Ordered: Current medicines are reviewed at length with the patient today.  Concerns regarding medicines are outlined above.  Tests Ordered: No orders of the defined types were placed in this encounter.  Medication Changes: No orders of the defined types were placed in this encounter.   Disposition:  Follow up in 3 months Signed, Fransico Him, MD  07/17/2019 9:06 AM    [place]

## 2019-07-17 ENCOUNTER — Telehealth (INDEPENDENT_AMBULATORY_CARE_PROVIDER_SITE_OTHER): Payer: Medicare HMO | Admitting: Cardiology

## 2019-07-17 ENCOUNTER — Telehealth: Payer: Self-pay | Admitting: Cardiology

## 2019-07-17 ENCOUNTER — Other Ambulatory Visit: Payer: Self-pay

## 2019-07-17 ENCOUNTER — Encounter: Payer: Self-pay | Admitting: Cardiology

## 2019-07-17 VITALS — Ht 73.0 in | Wt 289.0 lb

## 2019-07-17 DIAGNOSIS — I491 Atrial premature depolarization: Secondary | ICD-10-CM | POA: Insufficient documentation

## 2019-07-17 DIAGNOSIS — F419 Anxiety disorder, unspecified: Secondary | ICD-10-CM | POA: Insufficient documentation

## 2019-07-17 DIAGNOSIS — G4733 Obstructive sleep apnea (adult) (pediatric): Secondary | ICD-10-CM | POA: Diagnosis not present

## 2019-07-17 DIAGNOSIS — E7800 Pure hypercholesterolemia, unspecified: Secondary | ICD-10-CM

## 2019-07-17 DIAGNOSIS — I420 Dilated cardiomyopathy: Secondary | ICD-10-CM

## 2019-07-17 HISTORY — DX: Atrial premature depolarization: I49.1

## 2019-07-17 MED ORDER — ENTRESTO 49-51 MG PO TABS: 1.0000 | ORAL_TABLET | Freq: Two times a day (BID) | ORAL | 6 refills | Status: DC

## 2019-07-17 MED ORDER — SPIRONOLACTONE 25 MG PO TABS: 12.5000 mg | ORAL_TABLET | Freq: Once | ORAL | 3 refills | Status: DC

## 2019-07-17 NOTE — Addendum Note (Signed)
Addended by: Antonieta Iba on: 07/17/2019 10:24 AM   Modules accepted: Orders

## 2019-07-17 NOTE — Telephone Encounter (Signed)
Patient's wife is calling because she was confused about his Entresto dosage. I explained that Delene Loll is a combination of two medications. She verbalized understanding. Also confirmed appointment date and times that we discussed earlier today.

## 2019-07-17 NOTE — Patient Instructions (Signed)
Medication Instructions:  Your physician has recommended you make the following change in your medication: 1) INCREASE Entresto to 49-51 mg daily   *If you need a refill on your cardiac medications before your next appointment, please call your pharmacy*  Lab Work: BMET in one week.  If you have labs (blood work) drawn today and your tests are completely normal, you will receive your results only by: Marland Kitchen MyChart Message (if you have MyChart) OR . A paper copy in the mail If you have any lab test that is abnormal or we need to change your treatment, we will call you to review the results.  Follow-Up: At [facility] you and your health needs are our priority.  As part of our continuing mission to provide you with exceptional heart care, we have created designated Provider Care Teams.  These Care Teams include your primary Cardiologist (physician) and Advanced Practice Providers (APPs -  Physician Assistants and Nurse Practitioners) who all work together to provide you with the care you need, when you need it.  Your next appointment:   3 month(s)  The format for your next appointment:   In Person  Provider:   Fransico Him, MD  Other Instructions Please weigh yourself daily in the morning and call us if weight increases by 3 lbs in one day or by 5 lbs in one week.  Please check blood pressure regularly.

## 2019-07-17 NOTE — Telephone Encounter (Signed)
New Message:     Wife wants you to call her please. She said she had talked to youShe said she needs to talk to you about pt's Entresto.

## 2019-07-20 ENCOUNTER — Telehealth: Payer: Self-pay | Admitting: Cardiology

## 2019-07-20 NOTE — Telephone Encounter (Signed)
Attempted to contact patient but there was no answer and VM did not pick up. 

## 2019-07-20 NOTE — Telephone Encounter (Signed)
Patient's wife calling about his medication, she states patient does not have an appetite. He is not eating, he would not eat unless she ask him.  She feels he is on too many medications.  States he is gaging on his food.  He has lost more than 3lbs this past week. She is wondering if one of his medication is causing the lost appetite.

## 2019-07-22 ENCOUNTER — Ambulatory Visit: Payer: Medicare HMO | Admitting: Physician Assistant

## 2019-07-23 NOTE — Telephone Encounter (Signed)
Patient states that he has been trying to eat but he doesn't have much of an appetite. He states that he gets nauseous and doesn't have much energy. He has been very nervous lately with starting new medications and having so many doctors appointments. He states that he had been losing a little bit of weight but it has recently been stable. He thinks that his eating is starting to get a little but better. He has followed up with his PCP regarding this who told him it was due to nerves. I advised patient to try and stick with foods that are more bland that he can tolerate and to add Ensure or Boost to his diet. Patient verbalized understanding.  Also spoke patient's wife who voiced concern about his eating habits.

## 2019-07-24 ENCOUNTER — Other Ambulatory Visit: Payer: Self-pay | Admitting: Cardiology

## 2019-07-24 ENCOUNTER — Other Ambulatory Visit: Payer: Medicare HMO | Admitting: *Deleted

## 2019-07-24 ENCOUNTER — Other Ambulatory Visit: Payer: Self-pay

## 2019-07-25 LAB — BASIC METABOLIC PANEL
BUN/Creatinine Ratio: 15 (ref 10–24)
BUN: 18 mg/dL (ref 8–27)
CO2: 23 mmol/L (ref 20–29)
Calcium: 9.6 mg/dL (ref 8.6–10.2)
Chloride: 104 mmol/L (ref 96–106)
Creatinine, Ser: 1.23 mg/dL (ref 0.76–1.27)
GFR calc Af Amer: 68 mL/min/{1.73_m2} (ref >59)
GFR calc non Af Amer: 59 mL/min/{1.73_m2} — ABNORMAL LOW (ref >59)
Glucose: 113 mg/dL — ABNORMAL HIGH (ref 65–99)
Potassium: 5.2 mmol/L (ref 3.5–5.2)
Sodium: 143 mmol/L (ref 134–144)

## 2019-07-30 ENCOUNTER — Ambulatory Visit (INDEPENDENT_AMBULATORY_CARE_PROVIDER_SITE_OTHER): Payer: Medicare HMO | Admitting: Pharmacist

## 2019-07-30 ENCOUNTER — Other Ambulatory Visit: Payer: Self-pay

## 2019-07-30 VITALS — BP 125/81 | HR 97

## 2019-07-30 DIAGNOSIS — I5022 Chronic systolic (congestive) heart failure: Secondary | ICD-10-CM

## 2019-07-30 DIAGNOSIS — R9430 Abnormal result of cardiovascular function study, unspecified: Secondary | ICD-10-CM

## 2019-07-30 DIAGNOSIS — I5020 Unspecified systolic (congestive) heart failure: Secondary | ICD-10-CM | POA: Insufficient documentation

## 2019-07-30 MED ORDER — METOPROLOL SUCCINATE ER 50 MG PO TB24: 50.0000 mg | ORAL_TABLET | Freq: Every day | ORAL | 11 refills | Status: DC

## 2019-07-30 NOTE — Patient Instructions (Addendum)
It was very nice to meet you today  Please INCREASE the dose of your metoprolol from 25mg  to 50mg  once daily. You can take 2 of your 25mg  tablets until you run out, then pick up your new prescription for the 50mg  dose and take 1 tablet once a day  Continue taking your other medications - the Entresto 49-51mg  twice daily and spironolactone 12.5mg  once daily  Please look for a pill cutter to help splitting your spironolactone tablets  Follow up with Dr Haroldine Laws in the heart failure clinic as scheduled

## 2019-07-30 NOTE — Progress Notes (Signed)
Patient ID: Calvin Escobar                 DOB: 25-Nov-1950                      MRN: [id number]     HPI: Kirolos Ray is a 69 y.o. male referred by Dr. Radford Pax to pharmacy clinic for HF medication optimization. PMH is significant for HF with LVEF 25-30%, HLD, obesity, and OSA on BiPAP. At last visit with Dr Radford Pax 2 weeks ago, Delene Loll was increased to 49-51mg  BID dosing. F/u BMET stable, K 5.2. Pt presents today for follow up.  Pt reports tolerating all medications well. He brings in his home meds today which were verified with our list. He is still using his old Entresto 24-26mg  dose and confirms he's taking 2 tablets twice daily. Does not own a BP cuff. Reports it's difficult to split his spironolactone tablets. His wife helps with his medications. He has some nausea which has affected his appetite and caused some weight loss. Minimal exercise, he is SOB when seated. Denies dizziness, falls, headache, or LEE.  Current HTN/CHF meds:  Entresto 49-51mg  BID Toprol 25mg  daily Spironolactone 12.5mg  daily  BP goal: <130/35mmHg  Family History: The patient's family history includes Bone cancer in his sister; Cancer in his father; Hypertension in his mother.  Social History: Denies tobacco and illicit drug use, drinks beer.  Diet: Doesn't have much of an appetite, eats small portions.  Exercise: Minimal - low energy and feels weak  Home BP readings: does not own a cuff  Wt Readings from Last 3 Encounters:  07/17/19 289 lb (131.1 kg)  07/07/19 290 lb (131.5 kg)  07/03/19 295 lb (133.8 kg)   BP Readings from Last 3 Encounters:  07/03/19 121/66  02/03/16 (!) 143/95  09/12/13 136/77   Pulse Readings from Last 3 Encounters:  07/03/19 92  02/03/16 93  09/12/13 95    Renal function: Estimated Creatinine Clearance: 79.4 mL/min (by C-G formula based on SCr of 1.23 mg/dL).  Past Medical History:  Diagnosis Date  . Allergy seasonal  . Cancer ([place]) skin  . DCM (dilated cardiomyopathy) ([place])     nonischemic with normal coronary arteries at cath 06/2019  . Dementia ([place])   . Depression   . Hyperlipidemia   . OSA treated with BiPAP     Current Outpatient Medications on File Prior to Visit  Medication Sig Dispense Refill  . calcium carbonate (OSCAL) 1500 (600 Ca) MG TABS tablet Take 600 mg of elemental calcium by mouth daily.    Marland Kitchen LORazepam (ATIVAN) 0.5 MG tablet Take 0.5 mg by mouth 2 (two) times daily.    . metoprolol succinate (TOPROL XL) 25 MG 24 hr tablet Take 1 tablet (25 mg total) by mouth daily. 90 tablet 3  . Multiple Vitamins-Minerals (MULTIVITAMIN WITH MINERALS) tablet Take 1 tablet by mouth daily.    . pravastatin (PRAVACHOL) 20 MG tablet Take 20 mg by mouth at bedtime.     . sacubitril-valsartan (ENTRESTO) 49-51 MG Take 1 tablet by mouth 2 (two) times daily. 60 tablet 6  . spironolactone (ALDACTONE) 25 MG tablet Take 0.5 tablets (12.5 mg total) by mouth once for 1 dose. 45 tablet 3  . traZODone (DESYREL) 50 MG tablet Take 50 mg by mouth at bedtime as needed for sleep.      No current facility-administered medications on file prior to visit.    No Known Allergies   Assessment/Plan:  1.  Hypertension/CHF - BP well controlled at goal <130/63mmHg, there is room to optimize CHF meds. HR ranged in the 90-100s during his visit today. Will increase Toprol from 25mg  to 50mg  daily. Will continue Entresto 24-26mg  BID and spironolactone 12.5mg  daily (K 5.2, will not titrate Entresto dose). Encouraged him to purchase a pill cutter to help with his spironolactone. F/u as scheduled with referral to advanced HF clinic - he is scheduled to see Dr Haroldine Laws in 2 weeks.  Shakyia Bosso E. Karsen Nakanishi, PharmD, BCACP, [place] [id number], [place], [place] [address] Phone: [phone]; Fax: [phone] 07/30/2019 4:45 PM

## 2019-07-31 ENCOUNTER — Ambulatory Visit: Payer: Medicare HMO

## 2019-08-13 ENCOUNTER — Ambulatory Visit: Payer: Medicare HMO | Admitting: Cardiology

## 2019-08-14 ENCOUNTER — Encounter (HOSPITAL_COMMUNITY): Payer: Self-pay | Admitting: Internal Medicine

## 2019-08-14 ENCOUNTER — Ambulatory Visit (HOSPITAL_COMMUNITY)
Admission: RE | Admit: 2019-08-14 | Discharge: 2019-08-14 | Disposition: A | Discharge status: Home / Self Care | Payer: Medicare HMO | Source: Ambulatory Visit | Attending: Internal Medicine | Admitting: Internal Medicine

## 2019-08-14 ENCOUNTER — Other Ambulatory Visit: Payer: Self-pay

## 2019-08-14 VITALS — BP 126/82 | HR 96 | Wt 296.1 lb

## 2019-08-14 DIAGNOSIS — R9430 Abnormal result of cardiovascular function study, unspecified: Secondary | ICD-10-CM | POA: Diagnosis not present

## 2019-08-14 DIAGNOSIS — I493 Ventricular premature depolarization: Secondary | ICD-10-CM | POA: Diagnosis not present

## 2019-08-14 DIAGNOSIS — E785 Hyperlipidemia, unspecified: Secondary | ICD-10-CM | POA: Diagnosis not present

## 2019-08-14 DIAGNOSIS — G4700 Insomnia, unspecified: Secondary | ICD-10-CM | POA: Diagnosis not present

## 2019-08-14 DIAGNOSIS — I428 Other cardiomyopathies: Secondary | ICD-10-CM | POA: Insufficient documentation

## 2019-08-14 DIAGNOSIS — Z79899 Other long term (current) drug therapy: Secondary | ICD-10-CM | POA: Insufficient documentation

## 2019-08-14 DIAGNOSIS — Z7901 Long term (current) use of anticoagulants: Secondary | ICD-10-CM | POA: Diagnosis not present

## 2019-08-14 DIAGNOSIS — F0390 Unspecified dementia without behavioral disturbance: Secondary | ICD-10-CM | POA: Insufficient documentation

## 2019-08-14 DIAGNOSIS — I5022 Chronic systolic (congestive) heart failure: Secondary | ICD-10-CM | POA: Insufficient documentation

## 2019-08-14 DIAGNOSIS — F329 Major depressive disorder, single episode, unspecified: Secondary | ICD-10-CM | POA: Insufficient documentation

## 2019-08-14 DIAGNOSIS — F32A Depression, unspecified: Secondary | ICD-10-CM

## 2019-08-14 DIAGNOSIS — F419 Anxiety disorder, unspecified: Secondary | ICD-10-CM | POA: Diagnosis not present

## 2019-08-14 DIAGNOSIS — Z8249 Family history of ischemic heart disease and other diseases of the circulatory system: Secondary | ICD-10-CM | POA: Insufficient documentation

## 2019-08-14 DIAGNOSIS — I420 Dilated cardiomyopathy: Secondary | ICD-10-CM | POA: Diagnosis not present

## 2019-08-14 DIAGNOSIS — G4733 Obstructive sleep apnea (adult) (pediatric): Secondary | ICD-10-CM | POA: Diagnosis not present

## 2019-08-14 LAB — BASIC METABOLIC PANEL
Anion gap: 12 (ref 5–15)
BUN: 24 mg/dL — ABNORMAL HIGH (ref 8–23)
CO2: 24 mmol/L (ref 22–32)
Calcium: 9.5 mg/dL (ref 8.9–10.3)
Chloride: 103 mmol/L (ref 98–111)
Creatinine, Ser: 1.18 mg/dL (ref 0.61–1.24)
GFR calc Af Amer: >60 mL/min (ref >60)
GFR calc non Af Amer: >60 mL/min (ref >60)
Glucose, Bld: 124 mg/dL — ABNORMAL HIGH (ref 70–99)
Potassium: 4.8 mmol/L (ref 3.5–5.1)
Sodium: 139 mmol/L (ref 135–145)

## 2019-08-14 LAB — CBC
HCT: 46.6 % (ref 39.0–52.0)
Hemoglobin: 14.6 g/dL (ref 13.0–17.0)
MCH: 29.6 pg (ref 26.0–34.0)
MCHC: 31.3 g/dL (ref 30.0–36.0)
MCV: 94.5 fL (ref 80.0–100.0)
Platelets: 208 10*3/uL (ref 150–400)
RBC: 4.93 MIL/uL (ref 4.22–5.81)
RDW: 14 % (ref 11.5–15.5)
WBC: 9.4 10*3/uL (ref 4.0–10.5)
nRBC: 0 % (ref 0.0–0.2)

## 2019-08-14 LAB — TSH: TSH: 2.479 u[IU]/mL (ref 0.350–4.500)

## 2019-08-14 LAB — BRAIN NATRIURETIC PEPTIDE: B Natriuretic Peptide: 48.2 pg/mL (ref 0.0–100.0)

## 2019-08-14 MED ORDER — ENTRESTO 97-103 MG PO TABS: 1.0000 | ORAL_TABLET | Freq: Two times a day (BID) | ORAL | 6 refills | Status: DC

## 2019-08-14 NOTE — Progress Notes (Signed)
ADVANCED HF CLINIC CONSULT NOTE  Referring Physician: Dr. Radford Pax Primary Cardiologist: Dr. Radford Pax  HPI:  Calvin Escobar is a 69 yo male with a hx of HLD, morbid obesity and OSA on BiPAPwho was recently diagnosed with a systolic HF due to NICM with EF 25-30%. Referred by Dr. Radford Pax for further evaluation of his HF.   Echo 06/22/19 EF 25-30% with mild RV dysfunction  He underwent right and left heart cath on 07/03/19 showing normal coronary arteries with normal hemodynamics and EF 25-30%.   Ao = 117/77 (92)  LV =  112/13 RA =  7 RV = 28/9 PA = 28/12 (18) PCW = 11 Fick cardiac output/index = 6.0/2.4 PVR = 1.2 WU Ao sat = 99% PA sat = 69%, 71%  Zio 2/21  NSR mean HR 82. 2 brief runs NSVT. Rare PVCs  Here with his wife. 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 Says his wife yells at him for not being active enough. Says he tries to use CPAP every day but says in order to use it he has to be really tired. He has been following with Neuro for OSA. Can walk around house if he wants but says he doesn't do it. Says he just lies in bed for most of the day watching TV. Denies orthopnea or PND. Wife brings him meds. No edema.  Says he has severe depression and anxiety. Sees psychiatrist. Frustrated with not being able to sleep     Review of Systems: [y] = yes, [ ]  = no   General: Weight gain [ ] ; Weight loss [ ] ; Anorexia [ ] ; Fatigue Blue.Reese ]; Fever [ ] ; Chills [ ] ; Weakness [ ]   Cardiac: Chest pain/pressure [ ] ; Resting SOB [ ] ; Exertional SOB [ ] ; Orthopnea [ ] ; Pedal Edema [ ] ; Palpitations [ ] ; Syncope [ ] ; Presyncope [ ] ; Paroxysmal nocturnal dyspnea[ ]   Pulmonary: Cough [ ] ; Wheezing[ ] ; Hemoptysis[ ] ; Sputum [ ] ; Snoring [ ]   GI: Vomiting[ ] ; Dysphagia[ ] ; Melena[ ] ; Hematochezia [ ] ; Heartburn[ ] ; Abdominal pain [ ] ; Constipation [ ] ; Diarrhea [ ] ; BRBPR [ ]   GU: Hematuria[ ] ; Dysuria [ ] ; Nocturia[ ]   Vascular: Pain in legs with walking [ ] ; Pain in feet with lying flat [ ] ; Non-healing  sores [ ] ; Stroke [ ] ; TIA [ ] ; Slurred speech [ ] ;  Neuro: Headaches[ ] ; Vertigo[ ] ; Seizures[ ] ; Paresthesias[ ] ;Blurred vision [ ] ; Diplopia [ ] ; Vision changes [ ]   Ortho/Skin: Arthritis Blue.Reese ]; Joint pain [ y]; Muscle pain [ ] ; Joint swelling [ ] ; Back Pain [ ] ; Rash [ ]   Psych: Depression[y ]; Anxiety[y ]  Heme: Bleeding problems [ ] ; Clotting disorders [ ] ; Anemia [ ]   Endocrine: Diabetes [ ] ; Thyroid dysfunction[ ]    Past Medical History:  Diagnosis Date  . Allergy seasonal  . Cancer ([place]) skin  . DCM (dilated cardiomyopathy) ([place])    nonischemic with normal coronary arteries at cath 06/2019  . Dementia ([place])   . Depression   . Hyperlipidemia   . OSA treated with BiPAP     Current Outpatient Medications  Medication Sig Dispense Refill  . calcium carbonate (OSCAL) 1500 (600 Ca) MG TABS tablet Take 600 mg of elemental calcium by mouth daily.    Marland Kitchen LORazepam (ATIVAN) 0.5 MG tablet Take 0.5 mg by mouth 2 (two) times daily.    . metoprolol succinate (TOPROL XL) 50 MG 24 hr tablet Take 1 tablet (50 mg  total) by mouth daily. 30 tablet 11  . mirtazapine (REMERON) 30 MG tablet Take 30 mg by mouth at bedtime.    . Multiple Vitamins-Minerals (MULTIVITAMIN WITH MINERALS) tablet Take 1 tablet by mouth daily.    . pravastatin (PRAVACHOL) 20 MG tablet Take 20 mg by mouth at bedtime.     . sacubitril-valsartan (ENTRESTO) 49-51 MG Take 1 tablet by mouth 2 (two) times daily. 60 tablet 6  . spironolactone (ALDACTONE) 25 MG tablet Take 0.5 tablets (12.5 mg total) by mouth once for 1 dose. 45 tablet 3  . traZODone (DESYREL) 50 MG tablet Take 50 mg by mouth at bedtime as needed for sleep.      No current facility-administered medications for this encounter.    No Known Allergies    Social History   Socioeconomic History  . Marital status: Married    Spouse name: Not on file  . Number of children: Not on file  . Years of education: Not on file  . Highest education level: Not on file    Occupational History  . Not on file  Tobacco Use  . Smoking status: Never Smoker  . Smokeless tobacco: Never Used  Substance and Sexual Activity  . Alcohol use: Yes    Alcohol/week: 14.0 standard drinks    Types: 14 Cans of beer per week  . Drug use: No  . Sexual activity: Not on file  Other Topics Concern  . Not on file  Social History Narrative  . Not on file   Social Determinants of Health   Financial Resource Strain:   . Difficulty of Paying Living Expenses:   Food Insecurity:   . Worried About Charity fundraiser in the Last Year:   . Arboriculturist in the Last Year:   Transportation Needs:   . Film/video editor (Medical):   Marland Kitchen Lack of Transportation (Non-Medical):   Physical Activity:   . Days of Exercise per Week:   . Minutes of Exercise per Session:   Stress:   . Feeling of Stress :   Social Connections:   . Frequency of Communication with Friends and Family:   . Frequency of Social Gatherings with Friends and Family:   . Attends Religious Services:   . Active Member of Clubs or Organizations:   . Attends Archivist Meetings:   Marland Kitchen Marital Status:   Intimate Partner Violence:   . Fear of Current or Ex-Partner:   . Emotionally Abused:   Marland Kitchen Physically Abused:   . Sexually Abused:       Family History  Problem Relation Age of Onset  . Hypertension Mother   . Cancer Father   . Bone cancer Sister     Vitals:   08/14/19 1105  BP: 126/82  Pulse: 96  SpO2: 97%  Weight: 134.3 kg (296 lb 2 oz)    PHYSICAL EXAM: General: Obese  Fatigued. No respiratory difficulty HEENT: normal Neck: supple. no JVD. Carotids 2+ bilat; no bruits. No lymphadenopathy or thryomegaly appreciated. Cor: PMI nondisplaced. Regular rate & rhythm. No rubs, gallops or murmurs. Lungs: clear Abdomen: obese soft, nontender, nondistended. No hepatosplenomegaly. No bruits or masses. Good bowel sounds. Extremities: no cyanosis, clubbing, rash, edema Neuro: alert & oriented  x 3, cranial nerves grossly intact. moves all 4 extremities w/o difficulty. Affect pleasant.  ECG: NSR 97 iLBBB. QRS 116 Personally reviewed   ASSESSMENT & PLAN:  1. Chronic systolic HF due to NICM - Echo 1/21: EF 25-30% mild  RV dysfunction - cath 2/21 normal coronaries  - zio 2/21 rare PVCs - unclear etiology for NICM - NYHA IIIb in setting of extreme inactivity - Volume status ok - Increase Entresto to 97/103 bid - Continue spiro 12.5 - Continue Toprol 50 daily - Eventually will add [place] - Check labs - Stress absolute need for more activity. Gave him goal of 2500 steps per day   2. OSA on BIPAP - Follows with Neuro. Need to check download for full data - I stressed need for complaince  3. Morbid obesity - stressed need for more activity and weight loss  4. Insomnia - told him that he should only be in bed for sleeping and that he should spend the rest of the day up or in the chair  5. 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  Glori Bickers, MD  12:56 PM

## 2019-08-14 NOTE — Patient Instructions (Signed)
Increase Entresto 97/103 mg Twice daily   Labs done today, we will notify you for abnormal results  Please get a FitBit and walk at least 2500 steps/day  Your physician discussed the importance of regular exercise and recommended that you start or continue a regular exercise program for good health.  Your physician recommends that you schedule a follow-up appointment in: 6 weeks (Tue April 27th at 3:20 PM)  If you have any questions or concerns before your next appointment please send [place] a message through [place] or call our office at [phone].  At the [facility], you and your health needs are our priority. As part of our continuing mission to provide you with exceptional heart care, we have created designated Provider Care Teams. These Care Teams include your primary Cardiologist (physician) and Advanced Practice Providers (APPs- Physician Assistants and Nurse Practitioners) who all work together to provide you with the care you need, when you need it.   You may see any of the following providers on your designated Care Team at your next follow up: Marland Kitchen Dr Glori Bickers . Dr Loralie Champagne . Darrick Grinder, NP . Lyda Jester, PA . Audry Riles, PharmD   Please be sure to bring in all your medications bottles to every appointment.

## 2019-08-18 ENCOUNTER — Ambulatory Visit: Payer: Medicare HMO | Admitting: Physician Assistant

## 2019-08-25 ENCOUNTER — Telehealth: Payer: Self-pay | Admitting: Cardiology

## 2019-08-25 NOTE — Telephone Encounter (Signed)
Spoke with Dr. Casimiro Needle who states that the patient told him that Dr. Radford Pax took him off of Effexor He would like to know the reasoning why because he would like to start the patient back on it if possible.  I do not see that we have a record of the patient ever taking Effexor nor any note from Dr. Radford Pax stating that he should not be taking it.  Dr. Casimiro Needle would like to know if Dr. Radford Pax thinks that he is okay to take Effexor from a cardiac standpoint.

## 2019-08-25 NOTE — Telephone Encounter (Signed)
We never told patient to stop Effexor and I am fine with him taking it

## 2019-08-25 NOTE — Telephone Encounter (Signed)
Dr. Casimiro Needle states he is requesting to speak with Dr. Radford Pax or her nurse in regards to the patient. Please see Carly's secure chat for Dr. Karen Chafe call back number.

## 2019-08-26 NOTE — Telephone Encounter (Signed)
Advised Dr. Casimiro Needle that Dr. Radford Pax is fine with the patient taking Effexor.

## 2019-09-29 ENCOUNTER — Ambulatory Visit (HOSPITAL_COMMUNITY)
Admission: RE | Admit: 2019-09-29 | Discharge: 2019-09-29 | Disposition: A | Discharge status: Home / Self Care | Payer: Medicare HMO | Source: Ambulatory Visit | Attending: Internal Medicine | Admitting: Internal Medicine

## 2019-09-29 ENCOUNTER — Other Ambulatory Visit: Payer: Self-pay

## 2019-09-29 ENCOUNTER — Encounter (HOSPITAL_COMMUNITY): Payer: Self-pay | Admitting: Internal Medicine

## 2019-09-29 VITALS — BP 130/88 | HR 92 | Wt 291.4 lb

## 2019-09-29 DIAGNOSIS — I5022 Chronic systolic (congestive) heart failure: Secondary | ICD-10-CM

## 2019-09-29 DIAGNOSIS — F329 Major depressive disorder, single episode, unspecified: Secondary | ICD-10-CM | POA: Diagnosis not present

## 2019-09-29 DIAGNOSIS — Z6838 Body mass index (BMI) 38.0-38.9, adult: Secondary | ICD-10-CM | POA: Insufficient documentation

## 2019-09-29 DIAGNOSIS — E785 Hyperlipidemia, unspecified: Secondary | ICD-10-CM | POA: Insufficient documentation

## 2019-09-29 DIAGNOSIS — Z79899 Other long term (current) drug therapy: Secondary | ICD-10-CM | POA: Diagnosis not present

## 2019-09-29 DIAGNOSIS — I428 Other cardiomyopathies: Secondary | ICD-10-CM | POA: Diagnosis not present

## 2019-09-29 DIAGNOSIS — G4733 Obstructive sleep apnea (adult) (pediatric): Secondary | ICD-10-CM

## 2019-09-29 DIAGNOSIS — G4700 Insomnia, unspecified: Secondary | ICD-10-CM | POA: Diagnosis not present

## 2019-09-29 DIAGNOSIS — F0390 Unspecified dementia without behavioral disturbance: Secondary | ICD-10-CM | POA: Insufficient documentation

## 2019-09-29 DIAGNOSIS — Z8249 Family history of ischemic heart disease and other diseases of the circulatory system: Secondary | ICD-10-CM | POA: Insufficient documentation

## 2019-09-29 DIAGNOSIS — I5020 Unspecified systolic (congestive) heart failure: Secondary | ICD-10-CM | POA: Diagnosis present

## 2019-09-29 MED ORDER — METOPROLOL SUCCINATE ER 50 MG PO TB24: 50.0000 mg | ORAL_TABLET | Freq: Two times a day (BID) | ORAL | 3 refills | Status: DC

## 2019-09-29 NOTE — Addendum Note (Signed)
Encounter addended by: Shonna Chock, CMA on: 0000000 4:01 PM  Actions taken: Order list changed, Diagnosis association updated

## 2019-09-29 NOTE — Progress Notes (Signed)
ADVANCED HF CLINIC NOTE  Referring Physician: Dr. Radford Pax Primary Cardiologist: Dr. Radford Pax  HPI:  Calvin Escobar is a 69 yo male with a hx of HLD, morbid obesity and OSA on BiPAPwho was recently diagnosed with a systolic HF due to NICM with EF 25-30%. Referred by Dr. Radford Pax for further evaluation of his HF.   Echo 06/22/19 EF 25-30% with mild RV dysfunction  He underwent right and left heart cath on 07/03/19 showing normal coronary arteries with normal hemodynamics and EF 25-30%.   Ao = 117/77 (92)  LV =  112/13 RA =  7 RV = 28/9 PA = 28/12 (18) PCW = 11 Fick cardiac output/index = 6.0/2.4 PVR = 1.2 WU Ao sat = 99% PA sat = 69%, 71%  Zio 2/21  NSR mean HR 82. 2 brief runs NSVT. Rare PVCs  Here with his wife. We saw him for the first time in 2/21. Entresto increased. He reports that he is suffering because he can't sleep and is up and down all night. Very anxious and depressed - seeing a psychiatrist. Wife reports that he has trouble swallowing. Trying to get around more. Walking 22 mins/day - 5 days per week. Mild DOE but not too bad. No CP or edema. Tolerating the increased dose of Entresto.     Past Medical History:  Diagnosis Date  . Allergy seasonal  . Cancer ([place]) skin  . DCM (dilated cardiomyopathy) ([place])    nonischemic with normal coronary arteries at cath 06/2019  . Dementia ([place])   . Depression   . Hyperlipidemia   . OSA treated with BiPAP     Current Outpatient Medications  Medication Sig Dispense Refill  . calcium carbonate (OSCAL) 1500 (600 Ca) MG TABS tablet Take 600 mg of elemental calcium by mouth daily.    Marland Kitchen escitalopram (LEXAPRO) 10 MG tablet Take 10 mg by mouth daily.     Marland Kitchen LORazepam (ATIVAN) 1 MG tablet 1 tablet by mouth every morning and 2 tablets by mouth at bedtime    . metoprolol succinate (TOPROL XL) 50 MG 24 hr tablet Take 1 tablet (50 mg total) by mouth daily. 30 tablet 11  . mirtazapine (REMERON) 30 MG tablet Take 30 mg by mouth at bedtime.    .  Multiple Vitamins-Minerals (MULTIVITAMIN WITH MINERALS) tablet Take 1 tablet by mouth daily.    . pravastatin (PRAVACHOL) 20 MG tablet Take 20 mg by mouth at bedtime.     . sacubitril-valsartan (ENTRESTO) 97-103 MG Take 1 tablet by mouth 2 (two) times daily. 60 tablet 6  . spironolactone (ALDACTONE) 25 MG tablet Take 0.5 tablets (12.5 mg total) by mouth once for 1 dose. 45 tablet 3   No current facility-administered medications for this encounter.    No Known Allergies    Social History   Socioeconomic History  . Marital status: Married    Spouse name: Not on file  . Number of children: Not on file  . Years of education: Not on file  . Highest education level: Not on file  Occupational History  . Not on file  Tobacco Use  . Smoking status: Never Smoker  . Smokeless tobacco: Never Used  Substance and Sexual Activity  . Alcohol use: Yes    Alcohol/week: 14.0 standard drinks    Types: 14 Cans of beer per week  . Drug use: No  . Sexual activity: Not on file  Other Topics Concern  . Not on file  Social History Narrative  . Not  on file   Social Determinants of Health   Financial Resource Strain:   . Difficulty of Paying Living Expenses:   Food Insecurity:   . Worried About Charity fundraiser in the Last Year:   . Arboriculturist in the Last Year:   Transportation Needs:   . Film/video editor (Medical):   Marland Kitchen Lack of Transportation (Non-Medical):   Physical Activity:   . Days of Exercise per Week:   . Minutes of Exercise per Session:   Stress:   . Feeling of Stress :   Social Connections:   . Frequency of Communication with Friends and Family:   . Frequency of Social Gatherings with Friends and Family:   . Attends Religious Services:   . Active Member of Clubs or Organizations:   . Attends Archivist Meetings:   Marland Kitchen Marital Status:   Intimate Partner Violence:   . Fear of Current or Ex-Partner:   . Emotionally Abused:   Marland Kitchen Physically Abused:   .  Sexually Abused:       Family History  Problem Relation Age of Onset  . Hypertension Mother   . Cancer Father   . Bone cancer Sister     Vitals:   09/29/19 1530  BP: 130/88  Pulse: 92  SpO2: 96%  Weight: 132.2 kg (291 lb 6.4 oz)    PHYSICAL EXAM: General:  Well appearing. No resp difficulty HEENT: normal Neck: supple. no JVD. Carotids 2+ bilat; no bruits. No lymphadenopathy or thryomegaly appreciated. Cor: PMI nondisplaced. Regular rate & rhythm. No rubs, gallops or murmurs. Lungs: clear Abdomen: obese soft, nontender, nondistended. No hepatosplenomegaly. No bruits or masses. Good bowel sounds. Extremities: no cyanosis, clubbing, rash, edema Neuro: alert & orientedx3, cranial nerves grossly intact. moves all 4 extremities w/o difficulty. Affect pleasant  ASSESSMENT & PLAN:  1. Chronic systolic HF due to NICM - Echo 1/21: EF 25-30% mild RV dysfunction - cath 2/21 normal coronaries  - zio 2/21 rare PVCs - unclear etiology for NICM - NYHA III - Volume status ok - Continue Entresto 97/103 bid - Continue spiro 12.5 - Increase Toprol to 50 bid - Eventually will add Farxiga - Increase activity 20 mins in am add 21mins in the afternoon  - Consider ICD in future - see back in 3 months with echo   2. OSA on BIPAP - Follows with Neuro. Need to check download for full data - I stressed need for complaince  3. Morbid obesity - stressed need for more activity and weight loss  4. Insomnia - has poor sleep hygiene and sits in bed all day  5. 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 - no change  Glori Bickers, MD  3:43 PM

## 2019-09-29 NOTE — Addendum Note (Signed)
Encounter addended by: Scarlette Calico, RN on: 09/29/2019 4:00 PM  Actions taken: Clinical Note Signed

## 2019-09-29 NOTE — Patient Instructions (Addendum)
No labs done today.  Increase Metoprolol 50mg (1 tablet) by mouth twice daily.  No other medication changes were made. Please continue all other medications as prescribed.   Your physician recommends that you schedule a follow-up appointment in: 5 months with echocardiogram  Your physician has requested that you have an echocardiogram. Echocardiography is a painless test that uses sound waves to create images of your heart. It provides your doctor with information about the size and shape of your heart and how well your heart's chambers and valves are working. This procedure takes approximately one hour. There are no restrictions for this procedure.  At the [facility], you and your health needs are our priority. As part of our continuing mission to provide you with exceptional heart care, we have created designated Provider Care Teams. These Care Teams include your primary Cardiologist (physician) and Advanced Practice Providers (APPs- Physician Assistants and Nurse Practitioners) who all work together to provide you with the care you need, when you need it.   You may see any of the following providers on your designated Care Team at your next follow up: Marland Kitchen Dr Glori Bickers . Dr Loralie Champagne . Darrick Grinder, NP . Lyda Jester, PA . Audry Riles, PharmD   Please be sure to bring in all your medications bottles to every appointment.

## 2019-09-29 NOTE — Addendum Note (Signed)
Encounter addended by: Shonna Chock, CMA on: 0000000 4:04 PM  Actions taken: Order list changed

## 2019-09-29 NOTE — Addendum Note (Signed)
Encounter addended by: Shonna Chock, CMA on: 0000000 4:02 PM  Actions taken: Clinical Note Signed

## 2019-10-09 ENCOUNTER — Telehealth: Payer: Self-pay

## 2019-10-09 NOTE — Telephone Encounter (Signed)
Spoke with the patient's wife who is asking to changer the patient's appointment to a virtual visit. Appointment has been switched.

## 2019-10-19 ENCOUNTER — Telehealth: Payer: Medicare HMO | Admitting: Cardiology

## 2019-10-28 ENCOUNTER — Other Ambulatory Visit: Payer: Self-pay

## 2019-10-28 ENCOUNTER — Ambulatory Visit: Payer: Medicare HMO | Admitting: Cardiology

## 2019-10-28 ENCOUNTER — Encounter: Payer: Self-pay | Admitting: Cardiology

## 2019-10-28 VITALS — BP 132/82 | HR 87 | Ht 72.0 in | Wt 287.0 lb

## 2019-10-28 DIAGNOSIS — E7800 Pure hypercholesterolemia, unspecified: Secondary | ICD-10-CM | POA: Diagnosis not present

## 2019-10-28 DIAGNOSIS — I420 Dilated cardiomyopathy: Secondary | ICD-10-CM

## 2019-10-28 DIAGNOSIS — F5101 Primary insomnia: Secondary | ICD-10-CM

## 2019-10-28 DIAGNOSIS — R110 Nausea: Secondary | ICD-10-CM

## 2019-10-28 DIAGNOSIS — I5022 Chronic systolic (congestive) heart failure: Secondary | ICD-10-CM | POA: Diagnosis not present

## 2019-10-28 MED ORDER — CARVEDILOL 12.5 MG PO TABS: 12.5000 mg | ORAL_TABLET | Freq: Two times a day (BID) | ORAL | 3 refills | Status: DC

## 2019-10-28 NOTE — Addendum Note (Signed)
Addended by: Antonieta Iba on: 10/28/2019 04:21 PM   Modules accepted: Orders

## 2019-10-28 NOTE — Progress Notes (Addendum)
Date:  10/28/2019   ID:  [name], DOB [birth year], MRN [id number]  PCP:  [name], MD  Cardiologist:  Fransico Him, MD/Daniel Bensimhon, MD Electrophysiologist:  None   Chief Complaint:  DCM  History of Present Illness:    Calvin Escobar is a 69 y.o. male who presents via audio/video conferencing for a telehealth visit today.    [name], [name] (followed by Neurology with [facility]) who was recently diagnosed with a DCM.  He recently underwent right and left heart cath showing normal coronary arteries with normal hemodynamics and EF 25-30%.  He was started on standard guideline directed therapy and referred to AHF clinic.  He was recently seen by Dr. Haroldine Laws in AHF clinic and appeared to be euvolemic at that time. His Toprol was increased to 50mg  BID and continue on Entresto 97-103mg  BID as well as spiro 12.5mg  daily.  He is to be seen back in their office in 3 months for reassessment.  He has OSA and is on BiPAP and follows with Neuro.   He is here today for followup.  He started having problems with severe nausea after his Toprol was increased initially to 50mg  daily and then to 50mg  BID.  His appetite has significantly declined and is nauseated a good part of the time.  He is still able to eat but has lost 9lbs in the past 2 months. He is also having significant issues with insomnia and says that he is not sleeping well at night at all.  He does not want to use the CPAP.  He is followed for his OSA by Neurology with [facility] and has an appt in the near future to see them again.  He denies any chest pain or pressure, SOB, DOE, PND, orthopnea, LE edema, dizziness, palpitations or syncope. He is compliant with his meds and is tolerating meds with no SE.    Prior CV studies:   The following studies were reviewed today:  OV notes from AHF clinic  Past Medical History:  Diagnosis Date  . Allergy seasonal  . Cancer ([place]) skin  . DCM  (dilated cardiomyopathy) ([place])    nonischemic with normal coronary arteries at cath 06/2019  . Dementia ([place])   . Depression   . Hyperlipidemia   . OSA treated with BiPAP    Past Surgical History:  Procedure Laterality Date  . COLONOSCOPY    . RIGHT/LEFT HEART CATH AND CORONARY ANGIOGRAPHY N/A 07/03/2019   Procedure: RIGHT/LEFT HEART CATH AND CORONARY ANGIOGRAPHY;  Surgeon: Jolaine Artist, MD;  Location: [facility];  Service: Cardiovascular;  Laterality: N/A;     Current Meds  Medication Sig  . calcium carbonate (OSCAL) 1500 (600 Ca) MG TABS tablet Take 600 mg of elemental calcium by mouth daily.  Marland Kitchen escitalopram (LEXAPRO) 10 MG tablet Take 10 mg by mouth daily.   Marland Kitchen LORazepam (ATIVAN) 1 MG tablet 1 tablet by mouth every morning and 2 tablets by mouth at bedtime  . mirtazapine (REMERON) 30 MG tablet Take 30 mg by mouth at bedtime.  . Multiple Vitamins-Minerals (MULTIVITAMIN WITH MINERALS) tablet Take 1 tablet by mouth daily.  . pravastatin (PRAVACHOL) 20 MG tablet Take 20 mg by mouth at bedtime.   . sacubitril-valsartan (ENTRESTO) 97-103 MG Take 1 tablet by mouth 2 (two) times daily.  . [DISCONTINUED] metoprolol succinate (TOPROL XL) 50 MG 24 hr tablet Take 1 tablet (50 mg total) by  mouth 2 (two) times daily.     Allergies:   Patient has no known allergies.   Social History   Tobacco Use  . Smoking status: Never Smoker  . Smokeless tobacco: Never Used  Substance Use Topics  . Alcohol use: Yes    Alcohol/week: 14.0 standard drinks    Types: 14 Cans of beer per week  . Drug use: No     Family Hx: The patient's family history includes Bone cancer in his sister; Cancer in his father; Hypertension in his mother.  ROS:   Please see the history of present illness.     All other systems reviewed and are negative.   Labs/Other Tests and Data Reviewed:    Recent Labs: 08/14/2019: B Natriuretic Peptide 48.2; BUN 24; Creatinine, Ser 1.18; Hemoglobin 14.6; Platelets  208; Potassium 4.8; Sodium 139; TSH 2.479   Recent Lipid Panel No results found for: CHOL, TRIG, HDL, CHOLHDL, LDLCALC, LDLDIRECT  Wt Readings from Last 3 Encounters:  10/28/19 287 lb (130.2 kg)  09/29/19 291 lb 6.4 oz (132.2 kg)  08/14/19 296 lb 2 oz (134.3 kg)     Objective:    Vital Signs:  BP 132/82   Pulse 87   Ht 6' (1.829 m)   Wt 287 lb (130.2 kg)   SpO2 97%   BMI 38.92 kg/m    ASSESSMENT & PLAN:    1.  Nonischemic DM/Chronic systolic CHF -cardiac cath showed normal coronary arteries and normal hemodynamics -nonischemic in origin - felt to be related to OSA by Dr. Haroldine Laws who did the cath -appears euvolemic on exam today and weight down 9lbs in 2 months -continue to follow < 2gm Na diet and < 2L fluid restriction daily -continue to weigh daily and call for wt gain > 3lbs in 1 day or 5lbs in 1 week  -continue Entresto 97-103mg  BID and spiro 12.5mg  daily   -consider increasing BB further or adding Ivabradine given HR persistently above 80 -he has followup soon in AHF clinic for reassessment of EF -he has been having severe nausea since increasing dose of toprol -I will change Toprol to carvedilol 12.5mg  BID and see if nausea improves -I will have him see Dr Haroldine Laws back in a week  2.  HLD -LDL goal < 70 -followed by PCP -continue pravastatin 20mg  daily  3.  Morbid Obesity -I have encouraged him to get into a routine exercise program and cut back on carbs and portions.   4.  Nausea -this seems to have started after increasing the dose of Toprol -he is still able to eat but nausea starts each morning after he takes his am Toprol.   -he has lost 9lbs in 2 months due to nausea -see above>>will change Toprol to carvedilol to see if that improves things -need to consider possible low output and right heart failure as etiologies of nausea but he has no LE edema on exam and hemodynamically stable -he will followup with AHF clinic next week -I  Instructed him to  call if he has worsening of symptoms -check BNP, CMET, TSH, CBC today  5.  Insomnia -he is having severe insomnia and says that he is not sleeping at all -? Whether this is an effect of the increased dose of Toprol -he has tried melatonin but this has not helped -he sees [facility] for OSA -I have encouraged him to call his Neurologist today to let her know what is going on   Medication Adjustments/Labs and Tests Ordered:  Current medicines are reviewed at length with the patient today.  Concerns regarding medicines are outlined above.  Tests Ordered: Orders Placed This Encounter  Procedures  . Comprehensive metabolic panel  . Pro b natriuretic peptide (BNP)  . CBC  . TSH   Medication Changes: Meds ordered this encounter  Medications  . carvedilol (COREG) 12.5 MG tablet    Sig: Take 1 tablet (12.5 mg total) by mouth 2 (two) times daily.    Dispense:  180 tablet    Refill:  3    Disposition: I will see him on a PRN basis as his main cardiac issue is DCM/CHF and he is being managed in CHF clinic now  Signed, Fransico Him, MD  10/28/2019 5:43 PM    [place]

## 2019-10-28 NOTE — Patient Instructions (Signed)
Medication Instructions:  Your physician has recommended you make the following change in your medication:  1) STOP taking Toprol  2) START taking carvedilol 12.5 mg twice per day  *If you need a refill on your cardiac medications before your next appointment, please call your pharmacy*  Lab Work: TODAY: CMET, BNP, CBC, TSH If you have labs (blood work) drawn today and your tests are completely normal, you will receive your results only by: Marland Kitchen MyChart Message (if you have MyChart) OR . A paper copy in the mail If you have any lab test that is abnormal or we need to change your treatment, we will call you to review the results.   Follow-Up: At [facility], you and your health needs are our priority.  As part of our continuing mission to provide you with exceptional heart care, we have created designated Provider Care Teams.  These Care Teams include your primary Cardiologist (physician) and Advanced Practice Providers (APPs -  Physician Assistants and Nurse Practitioners) who all work together to provide you with the care you need, when you need it.  Follow up with Dr. Haroldine Laws in the [facility] next week

## 2019-10-29 ENCOUNTER — Telehealth: Payer: Self-pay

## 2019-10-29 DIAGNOSIS — I5022 Chronic systolic (congestive) heart failure: Secondary | ICD-10-CM

## 2019-10-29 DIAGNOSIS — I420 Dilated cardiomyopathy: Secondary | ICD-10-CM

## 2019-10-29 LAB — CBC
Hematocrit: 42.8 % (ref 37.5–51.0)
Hemoglobin: 14.1 g/dL (ref 13.0–17.7)
MCH: 30.1 pg (ref 26.6–33.0)
MCHC: 32.9 g/dL (ref 31.5–35.7)
MCV: 92 fL (ref 79–97)
Platelets: 225 10*3/uL (ref 150–450)
RBC: 4.68 x10E6/uL (ref 4.14–5.80)
RDW: 12.5 % (ref 11.6–15.4)
WBC: 7.9 10*3/uL (ref 3.4–10.8)

## 2019-10-29 LAB — COMPREHENSIVE METABOLIC PANEL
ALT: 11 IU/L (ref 0–44)
AST: 18 IU/L (ref 0–40)
Albumin/Globulin Ratio: 1.5 (ref 1.2–2.2)
Albumin: 4.2 g/dL (ref 3.8–4.8)
Alkaline Phosphatase: 85 IU/L (ref 48–121)
BUN/Creatinine Ratio: 18 (ref 10–24)
BUN: 23 mg/dL (ref 8–27)
Bilirubin Total: 0.4 mg/dL (ref 0.0–1.2)
CO2: 26 mmol/L (ref 20–29)
Calcium: 9.2 mg/dL (ref 8.6–10.2)
Chloride: 103 mmol/L (ref 96–106)
Creatinine, Ser: 1.31 mg/dL — ABNORMAL HIGH (ref 0.76–1.27)
GFR calc Af Amer: 63 mL/min/{1.73_m2} (ref >59)
GFR calc non Af Amer: 55 mL/min/{1.73_m2} — ABNORMAL LOW (ref >59)
Globulin, Total: 2.8 g/dL (ref 1.5–4.5)
Glucose: 97 mg/dL (ref 65–99)
Potassium: 5.4 mmol/L — ABNORMAL HIGH (ref 3.5–5.2)
Sodium: 141 mmol/L (ref 134–144)
Total Protein: 7 g/dL (ref 6.0–8.5)

## 2019-10-29 LAB — TSH: TSH: 2.29 u[IU]/mL (ref 0.450–4.500)

## 2019-10-29 LAB — PRO B NATRIURETIC PEPTIDE: NT-Pro BNP: 177 pg/mL (ref 0–376)

## 2019-10-29 NOTE — Telephone Encounter (Signed)
-----   Message from Sueanne Margarita, MD sent at 10/29/2019  7:58 AM EDT ----- Since K+ elevated please stop spiro for now.  May be somewhat volume depleted due to decreased PO intake.  Will have AHF service reassess next week with repeat BMET on Tuesday

## 2019-11-13 ENCOUNTER — Other Ambulatory Visit: Payer: Self-pay

## 2019-11-13 ENCOUNTER — Other Ambulatory Visit: Payer: Medicare HMO | Admitting: *Deleted

## 2019-11-13 DIAGNOSIS — I5022 Chronic systolic (congestive) heart failure: Secondary | ICD-10-CM

## 2019-11-13 DIAGNOSIS — I420 Dilated cardiomyopathy: Secondary | ICD-10-CM

## 2019-11-14 LAB — BASIC METABOLIC PANEL
BUN/Creatinine Ratio: 14 (ref 10–24)
BUN: 17 mg/dL (ref 8–27)
CO2: 25 mmol/L (ref 20–29)
Calcium: 9.4 mg/dL (ref 8.6–10.2)
Chloride: 107 mmol/L — ABNORMAL HIGH (ref 96–106)
Creatinine, Ser: 1.25 mg/dL (ref 0.76–1.27)
GFR calc Af Amer: 67 mL/min/{1.73_m2} (ref >59)
GFR calc non Af Amer: 58 mL/min/{1.73_m2} — ABNORMAL LOW (ref >59)
Glucose: 120 mg/dL — ABNORMAL HIGH (ref 65–99)
Potassium: 5 mmol/L (ref 3.5–5.2)
Sodium: 145 mmol/L — ABNORMAL HIGH (ref 134–144)

## 2019-11-19 ENCOUNTER — Other Ambulatory Visit: Payer: Medicare HMO

## 2019-12-15 ENCOUNTER — Other Ambulatory Visit: Payer: Self-pay

## 2019-12-15 ENCOUNTER — Ambulatory Visit (INDEPENDENT_AMBULATORY_CARE_PROVIDER_SITE_OTHER): Payer: Medicare HMO | Admitting: Psychiatry

## 2019-12-15 DIAGNOSIS — Z62820 Parent-biological child conflict: Secondary | ICD-10-CM | POA: Diagnosis not present

## 2019-12-15 DIAGNOSIS — F3130 Bipolar disorder, current episode depressed, mild or moderate severity, unspecified: Secondary | ICD-10-CM | POA: Diagnosis not present

## 2019-12-15 DIAGNOSIS — G4721 Circadian rhythm sleep disorder, delayed sleep phase type: Secondary | ICD-10-CM | POA: Diagnosis not present

## 2019-12-15 DIAGNOSIS — R69 Illness, unspecified: Secondary | ICD-10-CM

## 2019-12-15 NOTE — Patient Instructions (Addendum)
Initial recommendations:  1. Today, try to stay up until exhausted, then go to bed sometime after 8:30pm.  Get up whenever it feels right in the morning.  A lot of things happened to get you into the wrong time zone, but it can be fixed as we move the start time for sleep.  2. Minimize daytime sleep wherever possible.  Try to limit daytime naps to 1 hour only -- it may be impossible to start with, but shave them down the best you can, and avoid them whenever possible.  Repeat staying up into the evening and going straight to bed when exhausted.  3. Good job scheduling daytime activites -- try to stay with them, they will give you reason to stick with daytime living.    4. When you need energy in the daytime, take 3-5 fast, deep breaths to flush out carbon dioxide.  5. Eat when you can, but try not to eat after 8pm.  You sleep more deeply when you are a little bit hungry.  6. Try to get sunlight before noon, any way you can.  If you can't get sunlight, get bright lights any way you can before noon.  7. If you watch TV in the evening or nighttime, dim it as much as you can stand.  The blue light from TV tends to wake up your brain.  8. If you want medication, Dr. Casimiro Needle will have some ideas.  If you want try something over the counter, I recommend 3-5mg  melatonin 1 hour before you mean to fall asleep.  (available in drug stores, grocery)  OK to start today if you want.  9. It looks like Drs. Bouska and Plovsky have already prescribed some important medicines -- escitalopram (antidepressant), zolpidem (strong sleeping pill), and lorazepam (antianxiety).  Make sure you get the timing right according to your doctors and that both doctors know about all three medicines.  My advice -- take escitalopram in the morning, regardless of when you sleep; take zolpidem ONLY when you are trying to go to sleep immediately and give in to it completely (it will make you do things without remembering if you get up  during its first 2 hours in you), and only take lorazepam about an hour before evening bedtime.  10. Re. [name], if possible, strike a family truce -- whatever he wants out of this can work out better if he can agree to hold down his own negativity.  He may need to be reminded that his dad is working on this and it takes time.  It will be important to work out conflict in your household and to learn whatever [name] can tell me that may help.  If possible, let's meet together next time.

## 2019-12-15 NOTE — Progress Notes (Signed)
PROBLEM-FOCUSED INITIAL PSYCHOTHERAPY EVALUATION Calvin Moore, PhD LP [facility]  Name: Calvin Escobar Date: 12/15/2019 Time spent: 50 min MRN: [id number] DOB: 06/10/1950 Guardian/Payee: self  PCP: [name], MD Documentation requested on this visit: No  PROBLEM HISTORY Reason for Visit /Presenting Problem:  Chief Complaint  Patient presents with  . Establish Care  . Insomnia    Narrative/History of Present Illness Referred by Calvin Escobar of [facility] (case mgr?) for treatment of insomnia.  Pt of Dr. Casimiro Needle, next door at [facility].  Several months of insomnia, no prior episodes.  Ends up sleeping afternoons, up about 7pm until 9am next day.  Has tried to get to bed at target times but just can't make it work. Will go to bed by 11pm but can't settle, tries for an hour then gets up and typically watches TV.  May sometimes get to sleep 4:30am/5am, and did so this morning until about 9am up.  Typically sleeps separately from Calvin Escobar, pretty routine.  Initially denies trouble, but then says Calvin Escobar has threatened Typically sleeping daytime, 3 pieces, est. 5-8 hrs, and feels most energetic mid-evening.  Today purposely took a shower 12:30pm, has kept moving, but sluggish some.  Sleep environment is generally dark, but with a few lights to see by if get up.  No hx of working night shift or being used to this time frame, just developed.  Has also developed nausea c. 5pm many days.  Brother died about 2 months ago of lung cancer, metastasized to his brain, suddenly revealed.  Insomnia started before that.  Has sleep apnea, tried CPAP but couldn't abide it, returned it.  Roughly January had sleep study at [place], 2 nights, identified with sleep apnea.  Has been depressed a long time in his life anyway.    Wife's habit has been early bird -- bed 8:30pm, up 4am, goes to work 7am at [employer].  Son Calvin Escobar) [phone] lives with them, may wake her up if PT makes too much  noise.  Son will be harsh to PT, e.g., blame him for him having a cough.  Lonely underachiever who can be physically provocative.  PT says son wants PT in the bedroom, out of the way, and he largely complies.    Work -- was let go 3-4 yrs ago, got depressed, started taking catnaps that led to the altered sleep schedule.    Form [place] originally.  [name]   Prior Psychiatric Assessment/Treatment:   Outpatient treatment: med management by Donneta Romberg, MD Psychiatric hospitalization: deferred Psychological assessment/testing: none stated   Abuse/neglect screening: Victim of abuse: Not assessed at this time / none suspected.   Victim of neglect: Not assessed at this time / none suspected.   Perpetrator of abuse/neglect: Not assessed at this time / none suspected.   Witness / Exposure to Domestic Violence: Not assessed at this time / none suspected.   Witness to Community Violence:  Not assessed at this time / none suspected.   Protective Services Involvement: No.   Report needed: No.    Substance abuse screening: Current substance abuse: Not assessed at this time / none suspected.   History of impactful substance use/abuse: Not assessed at this time / none suspected.     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  Of note for psychotherapy at this time are antidepressant (Lexapro 10mg ), sleep  aid (Ambien 10mg ), and sedative (lorazepam) but dose times are murky given PT's sleep schedule and the fact that his wife lays them out for him.  Unclear whether she is delivering energizing and sedating meds at the right time to encourage circadian rhythm.  Most likely other medications   Past Medical History:  Diagnosis Date  . Allergy seasonal  . Cancer ([place]) skin  . DCM (dilated cardiomyopathy) ([place])    nonischemic with normal coronary arteries at cath 06/2019  . Dementia ([place])   . Depression   . Hyperlipidemia   . OSA treated with BiPAP      Past Surgical History:  Procedure Laterality Date  . COLONOSCOPY    . RIGHT/LEFT HEART CATH AND CORONARY ANGIOGRAPHY N/A 07/03/2019   Procedure: RIGHT/LEFT HEART CATH AND CORONARY ANGIOGRAPHY;  Surgeon: Jolaine Artist, MD;  Location: [place] [facility];  Service: Cardiovascular;  Laterality: N/A;    No Known Allergies  Medications (as listed in Epic): Current Outpatient Medications  Medication Sig Dispense Refill  . calcium carbonate (OSCAL) 1500 (600 Ca) MG TABS tablet Take 600 mg of elemental calcium by mouth daily.    . carvedilol (COREG) 12.5 MG tablet Take 1 tablet (12.5 mg total) by mouth 2 (two) times daily. 180 tablet 3  . escitalopram (LEXAPRO) 10 MG tablet Take 10 mg by mouth daily.     . Eszopiclone 3 MG TABS Take by mouth.    Marland Kitchen LORazepam (ATIVAN) 1 MG tablet 1 tablet by mouth every morning and 2 tablets by mouth at bedtime    . metoprolol succinate (TOPROL-XL) 50 MG 24 hr tablet     . mirtazapine (REMERON) 30 MG tablet Take 30 mg by mouth at bedtime. (Patient not taking: Reported on 02/19/2020)    . Multiple Vitamins-Minerals (MULTIVITAMIN WITH MINERALS) tablet Take 1 tablet by mouth daily.    . pravastatin (PRAVACHOL) 20 MG tablet Take 20 mg by mouth at bedtime.     . sacubitril-valsartan (ENTRESTO) 97-103 MG Take 1 tablet by mouth 2 (two) times daily. 60 tablet 6  . spironolactone (ALDACTONE) 25 MG tablet TAKE 0.5 TABLETS  (12.5 MG TOTAL) BY MOUTH ONCE FOR 1 DOSE. 90 tablet 1  . traZODone (DESYREL) 100 MG tablet     . venlafaxine XR (EFFEXOR-XR) 75 MG 24 hr capsule     . zolpidem (AMBIEN) 10 MG tablet      No current facility-administered medications for this visit.    MENTAL STATUS AND OBSERVATIONS Appearance:   Casual and Disheveled     Behavior:  Appropriate  Motor:  orthopedic issues, stiff gait  Speech/Language:   clear, mild slurring  Affect:  downcast, weary  Mood:  depressed  Thought process:  concrete  Thought content:    WNL  Sensory/Perceptual disturbances:    WNL  Orientation:  Fully oriented  Attention:  Fair  Concentration:  Fair  Memory:  grossly intact, w/ limitations  Fund of knowledge:   Fair  Insight:    Fair  Judgment:   Fair  Impulse Control:  Fair   Initial Risk Assessment: Danger to self: No Self-injurious behavior: No Danger to others:  No Physical aggression / violence: No Duty to warn: No Access to firearms a concern: No Gang involvement: No Patient / guardian was educated about steps to take if suicide or homicide risk level increases between visits: yes . While future psychiatric events cannot be accurately predicted, the patient does not currently require acute inpatient psychiatric care and does not currently meet [facility] involuntary commitment criteria.   DIAGNOSIS:    ICD-10-CM   1. Circadian rhythm sleep disorder, delayed sleep phase type  G47.21   2. Bipolar I disorder, most recent episode depressed ([place])  F31.30   3. Relationship problem between parent and child  Z62.820   4. r/o early dementia  R69     INITIAL TREATMENT: . Support/validation provided for distressing symptoms and confirmed rapport . Ethical orientation and informed consent confirmed re: o privacy rights -- including but not limited to HIPAA, EMR and use of e-PHI o patient responsibilities -- scheduling, fair notice of changes, in-person vs. telehealth and regulatory and financial  conditions affecting choice o expectations for working relationship in psychotherapy o needs and consents for working partnerships and exchange of information with other health care providers, especially any medication and other behavioral health providers . Initial orientation to cognitive-behavioral and solution-focused therapy approach . Outlook for therapy -- scheduling constraints, availability of crisis service, inclusion of family member(s) as appropriate . Main goal to restore normal sleep.  Would prefer 10-11pm bedtime, up 8-9a.   . Has scheduled activities with [facility] to have things to get out of the house for, and a "wisdom group" Dr. Casimiro Needle got him going with at [facility] (part of [facility], actually).    Plan: . Detailed recommendations as printed in AVS:  Initial recommendations:  1. Today, try to stay up until exhausted, then go to bed sometime after 8:30pm.  Get up whenever it feels right in the morning.  A lot of things happened to get you into the wrong time zone, but it can be fixed as we move the start time for sleep.  2. Minimize daytime sleep wherever possible.  Try to limit daytime naps to 1 hour only -- it may be impossible to start with, but shave them down the best you can, and avoid them whenever possible.  Repeat staying up into the evening and going straight to bed when exhausted.  3. Good job scheduling daytime activities -- try to stay with them, they will give you reason to stick with daytime living.    4. When you need energy in the daytime, take 3-5 fast, deep breaths to flush out carbon dioxide.  5. Eat when you can, but try not to eat after 8pm.  You sleep more deeply when you are a little bit hungry.  6. Try to get sunlight before noon, any way you can.  If you can't get sunlight, get bright lights any way you can before noon.  7. If you watch TV in the evening or nighttime, dim it as much as you can stand.  The blue light from TV tends to wake up your  brain.  8. If you want medication, Dr. Casimiro Needle will have some ideas.  If you want try something over the counter, I recommend 3-5mg  melatonin 1 hour before you mean to fall asleep.  (available in drug stores, grocery)  OK to start today if you want.  9. It looks like Drs. Bouska and Plovsky have already prescribed some important medicines -- escitalopram (antidepressant), zolpidem (strong sleeping pill), and  lorazepam (antianxiety).  Make sure you get the timing right according to your doctors and that both doctors know about all three medicines.  My advice -- take escitalopram in the morning, regardless of when you sleep; take zolpidem ONLY when you are trying to go to sleep immediately and give in to it completely (it will make you do things without remembering if you get up during its first 2 hours in you), and only take lorazepam about an hour before evening bedtime.  10. Re. Calvin Escobar, if possible, strike a family truce -- whatever he wants out of this can work out better if he can agree to hold down his own negativity.  He may need to be reminded that his dad is working on this and it takes time.  It will be important to work out conflict in your household and to learn whatever Calvin Escobar can tell me that may help.  If possible, let's meet together next time. . Maintain medication as prescribed and work faithfully with relevant prescriber(s) if any changes are desired or seem indicated.  Likely timing considerations included in AVS instructions. . Call the clinic on-call service, present to ER, or call 911 if any life-threatening psychiatric crisis Return in about 2 weeks (around 12/29/2019).  Hopefully can include wife next visit for background, comprehension, accurate memory and application, and assessment of family system factors which may   [name], PhD  Calvin Moore, PhD LP Clinical Psychologist, [facility] [address]  [place], [place] [address] [phone]

## 2019-12-29 ENCOUNTER — Other Ambulatory Visit: Payer: Self-pay | Admitting: Cardiology

## 2020-01-05 ENCOUNTER — Ambulatory Visit: Payer: Medicare HMO | Admitting: Psychiatry

## 2020-01-18 ENCOUNTER — Other Ambulatory Visit: Payer: Self-pay

## 2020-01-18 ENCOUNTER — Ambulatory Visit (INDEPENDENT_AMBULATORY_CARE_PROVIDER_SITE_OTHER): Payer: Medicare HMO | Admitting: Psychiatry

## 2020-01-18 DIAGNOSIS — R69 Illness, unspecified: Secondary | ICD-10-CM

## 2020-01-18 DIAGNOSIS — G4721 Circadian rhythm sleep disorder, delayed sleep phase type: Secondary | ICD-10-CM | POA: Diagnosis not present

## 2020-01-18 DIAGNOSIS — Z62820 Parent-biological child conflict: Secondary | ICD-10-CM | POA: Diagnosis not present

## 2020-01-18 DIAGNOSIS — F3130 Bipolar disorder, current episode depressed, mild or moderate severity, unspecified: Secondary | ICD-10-CM | POA: Diagnosis not present

## 2020-01-18 NOTE — Progress Notes (Signed)
Psychotherapy Progress Note [facility] Calvin Moore, PhD LP  Patient ID: Calvin Escobar     MRN: [id number] Therapy format: Individual psychotherapy Date: 01/18/2020      Start: 4:11p     Stop: 5:00p     Time Spent: 49 min Location: In-person   Session narrative (presenting needs, interim history, self-report of stressors and symptoms, applications of prior therapy, status changes, and interventions made in session) Has consulted Dr. Casimiro Needle since last seen, on 3 new medications -- venlafaxine 75mg  3 tabs QAM (taking at night) for about a week now, trazodone 100mg  QHS x 4, now 2-3 nights into 200 mg dose, and eszopiclone 3mg  QHS.  Takes them all about 9:30pm, starts to try to sleep 10pm, in and out of sleep for a while.  May get hungry, get up, eat an orange or something.  Erratic till about 2:30am, then sleeps more solidly till 11am or noon.  This morning saw it was 5am and got up to have breakfast with wife.  Has squashed nap-taking habit, has scheduled some daytime activities.  Not using the deep-breathing exercise as yet, but no acute need.  Sometimes late supper, generally doesn't eat breakfast b/c he misses.  About 1/3 of the time may eat after 8pm.  Tried to get sunlight, sat out 20-25 minutes, but too hot.  (Advised he can use a bright window if outdoors prohibits.)  Right now not watching much of any TV.  Refers to listen to music now.    Affirmed progress breaking up late sleep cycle and moving thing earlier.  Reviewed likely benefits and timing of new meds.  Wife has been ill for 2 weeks, COVID-negative, suspected stomach bug.  Saturday she went to hospital by ambulance with a suspected cardiac event, discharged with dx of a viral infection, came home by cab.  Tried to work anyway, had to call off.  She works in Calvin Escobar, Calvin Escobar.  May have some availability to consult on Pt's status.  Reaffirmed value in speaking with her, though it  sounds like she cannot likely spring free for a session, will try calling her after business hours.  Challenge, meanwhile, for PT to make a good-morning call with wife, maybe about 9:30am.  Ground rules no problem-solving, no worrying, no criticisms, just trying to connect for a minute, no urgent business or emotional issues to process.  Son Calvin Escobar does push him sometimes.  Continues to feel uncomfortable with him.  Therapeutic modalities: Cognitive Behavioral Therapy and Solution-Oriented/Positive Psychology  Mental Status/Observations:  Appearance:   Casual     Behavior:  Appropriate  Motor:  hobbled  Speech/Language:   relatively clear  Affect:  Appropriate  Mood:  depressed  Thought process:  concrete  Thought content:    WNL and worry  Sensory/Perceptual disturbances:    WNL  Orientation:  Fully oriented  Attention:  Fair    Concentration:  Good  Memory:  grossly intact  Insight:    Fair  Judgment:   Fair  Impulse Control:  Fair   Risk Assessment: Danger to Self: No Self-injurious Behavior: No Danger to Others: No Physical Aggression / Violence: No Duty to Warn: No Access to Firearms a concern: No  Assessment of progress:  progressing  Diagnosis:   ICD-10-CM   1. Circadian rhythm sleep disorder, delayed sleep phase type  G47.21   2. Bipolar I disorder, most recent episode depressed ([place])  F31.30   3. Relationship problem between parent and child  Z62.820   4. r/o early dementia  R69    Plan:  . Take all meds to PCP this week to be sure all meds and potential conflicts are accounted for . Continue list of recommendations from first visit . Wife welcome, TX will call her soon for background and coordination . Priorities for sleep: o Try to get a "light breakfast" -- exposure to sunlight, either indoors or out -- before 11am o Ask Calvin Escobar about the possibility of doing a "good morning" call between 9am and 10:30am.  Doesn't have to be long, just awake enough to  connect, say good morning, just make sure it doesn't get into anything negative.  Connecting as a couple helps with having something to get up for and with your bond as a couple. . At some point, you/we will have to address Calvin Escobar being too harsh.  For now, the best thing might be just to be ready to let him know he already told you and you have heard him, and if he seems wound up still, ask him what he wants to have happen. . Other recommendations/advice as may be noted above . Continue to utilize previously learned skills ad lib . Maintain medication as prescribed and work faithfully with relevant prescriber(s) if any changes are desired or seem indicated . Call the clinic on-call service, present to ER, or call 911 if any life-threatening psychiatric crisis Return in about 2 weeks (around 02/01/2020). . Already scheduled visit in this office Visit date not found.  [name], PhD Calvin Moore, PhD LP Clinical Psychologist, [facility] [address] [place], [place] [address] [phone]

## 2020-01-18 NOTE — Patient Instructions (Signed)
Advice from today:  Take all meds to PCP this week to be sure all meds and potential conflicts are accounted for  Continue list of recommendations from first visit  [name] is welcome, I will call her soon for background and coordination  Priorities for sleep: o Try to get a "light breakfast" -- exposure to sunlight, either indoors or out -- before 11am o Ask [name] about the possibility of doing a "good morning" call between 9am and 10:30am.  Doesn't have to be long, just awake enough to connect, say good morning, just make sure it doesn't get into anything negative.  Connecting as a couple helps with having something to get up for and with your bond as a couple.  At some point, you/we will have to address [name] being too harsh.  For now, the best thing might be just to be ready to let him know he already told you and you have heard him, and if he seems wound up still, ask him what he wants to have happen.

## 2020-01-21 ENCOUNTER — Telehealth: Payer: Self-pay | Admitting: Psychiatry

## 2020-01-21 NOTE — Telephone Encounter (Signed)
Admin note for non-service contact  Patient ID: Calvin Escobar  MRN: [id number] DATE: 01/21/2020  PT agreed to collateral information call with wife at 68st and 2nd visits.  1st attempt to reach her Tuesday, left message, she called back yesterday while TX out.  2nd attempt 5:45p, waited through a couple minutes completing a task changing batteries in a remote and getting PT started with it.  Reveals that Calvin Escobar was initially very reluctant to see Calvin Escobar, got it in his head that it meant giving up his psychiatrist, Dr. Casimiro Needle, and that 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    Hx that Calvin Escobar is longterm depressive, didn't know how much he drank when they met, became a stipulation that he deal with his habit, before engaged, in about [phone].  Oldest son Calvin Escobar drank himself to death, tore up his brother, Calvin Escobar.  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  Calvin Escobar was unhappy working in [name] and drinking over it late 1990s.  No suspicion of alcohol relapse at this time.  Evenings, wife tries to go to bed 8:30pm or so, but gives PT his pills at 9:30pm, he waits till 10pm or so to take them, then habitually goes downstairs for entertainment, TV mostly.  W and son Calvin Escobar have been encouraging him to do something more active earlier in the day, when it is light, even short thing s like fetch mail or trash cans, or short walk.  W has agreed to make a TC b/w 9 and 10:30am, though the idea was described more as PT telling her she was supposed to call him 9am, and she approached it "like a lawyer" (her term, working in a [employer]), asking him what guarantee she could get that he would stay up after the call.  Clarified to her that the idea is just to be a stimulus, that it does not have to mean any accountability conversation for the rest of the day, just a hello and good morning and specifically not get into any form of pushing for him to  do other things.  Says he has gotten into bad habits of nutrition, and she's trying to get him to take 1/2 hard-boiled egg or fruit or something decent, vegetables at supper.  Says his times for getting up and going down are "all over the map".    Discussed major points of CBT-I for sleep cycle disorder and tips for improving communication and recruiting motivation to try component tasks.  Acknowledges son Calvin Escobar does get very frustrated with Calvin Escobar, encouraged anything that can help Calvin Escobar know he does not have to make his father behave better.  Suggestion seems to be that much can get lost in translation between PT and son, and that it may not be accurate that Calvin Escobar can get physical with PT nor that he simply chases him into his room.  From W's account, PT does tend to get caffeine later in the day, not feel tired, and go do something more energizing like watch TV downstairs, which PT himself did note at first visit can be activity and/or noise that irritates Calvin Escobar during his own intended sleep time.  RTO 9/20.  Wife welcome to join, established openness to consult as needed if treatment ideas are not clear when PT reaches home.  Calvin Moore, PhD LP Clinical Psychologist, [facility] [address], [phone]  [place], [place] [address] [phone]

## 2020-02-18 NOTE — Progress Notes (Signed)
ADVANCED HF CLINIC NOTE  Referring Physician: Dr. Radford Pax Primary Cardiologist: Dr. Radford Pax  HPI:  Calvin Escobar is a 69 yo male with  morbid obesity, HL OSA on BiPAPwho was diagnosed with systolic HF I  6/43 due to NICM with EF 25-30%. Referred by Dr. Radford Pax for further evaluation of his HF.   Echo 06/22/19 EF 25-30% with mild RV dysfunction  He underwent right and left heart cath on 07/03/19 showing normal coronary arteries with normal hemodynamics and EF 25-30%.   Ao = 117/77 (92)  LV =  112/13 RA =  7 RV = 28/9 PA = 28/12 (18) PCW = 11 Fick cardiac output/index = 6.0/2.4 PVR = 1.2 WU Ao sat = 99% PA sat = 69%, 71%  Zio 2/21  NSR mean HR 82. 2 brief runs NSVT. Rare PVCs  Here with his wife for routine f/u. Walking several times per week through the park. Goes 23 mins at a time. Mild DOE. But goes slow and is OK. No edema, orthopnea or PND. No CP. Complisnt with meds. Not wearing BIPAP.  Echo today 02/19/20 EF 40-45% + LBBB Personally reviewed    Past Medical History:  Diagnosis Date  . Allergy seasonal  . Cancer ([place]) skin  . DCM (dilated cardiomyopathy) ([place])    nonischemic with normal coronary arteries at cath 06/2019  . Dementia ([place])   . Depression   . Hyperlipidemia   . OSA treated with BiPAP     Current Outpatient Medications  Medication Sig Dispense Refill  . calcium carbonate (OSCAL) 1500 (600 Ca) MG TABS tablet Take 600 mg of elemental calcium by mouth daily.    . carvedilol (COREG) 12.5 MG tablet Take 1 tablet (12.5 mg total) by mouth 2 (two) times daily. 180 tablet 3  . escitalopram (LEXAPRO) 10 MG tablet Take 10 mg by mouth daily.     Marland Kitchen LORazepam (ATIVAN) 1 MG tablet 1 tablet by mouth every morning and 2 tablets by mouth at bedtime    . mirtazapine (REMERON) 30 MG tablet Take 30 mg by mouth at bedtime.    . Multiple Vitamins-Minerals (MULTIVITAMIN WITH MINERALS) tablet Take 1 tablet by mouth daily.    . pravastatin (PRAVACHOL) 20 MG tablet Take 20 mg by  mouth at bedtime.     . sacubitril-valsartan (ENTRESTO) 97-103 MG Take 1 tablet by mouth 2 (two) times daily. 60 tablet 6  . spironolactone (ALDACTONE) 25 MG tablet TAKE 0.5 TABLETS (12.5 MG TOTAL) BY MOUTH ONCE FOR 1 DOSE. 90 tablet 1   No current facility-administered medications for this encounter.    No Known Allergies    Social History   Socioeconomic History  . Marital status: Married    Spouse name: Not on file  . Number of children: Not on file  . Years of education: Not on file  . Highest education level: Not on file  Occupational History  . Not on file  Tobacco Use  . Smoking status: Never Smoker  . Smokeless tobacco: Never Used  Substance and Sexual Activity  . Alcohol use: Yes    Alcohol/week: 14.0 standard drinks    Types: 14 Cans of beer per week  . Drug use: No  . Sexual activity: Not on file  Other Topics Concern  . Not on file  Social History Narrative  . Not on file   Social Determinants of Health   Financial Resource Strain:   . Difficulty of Paying Living Expenses: Not on file  Food Insecurity:   .  Worried About Charity fundraiser in the Last Year: Not on file  . Ran Out of Food in the Last Year: Not on file  Transportation Needs:   . Lack of Transportation (Medical): Not on file  . Lack of Transportation (Non-Medical): Not on file  Physical Activity:   . Days of Exercise per Week: Not on file  . Minutes of Exercise per Session: Not on file  Stress:   . Feeling of Stress : Not on file  Social Connections:   . Frequency of Communication with Friends and Family: Not on file  . Frequency of Social Gatherings with Friends and Family: Not on file  . Attends Religious Services: Not on file  . Active Member of Clubs or Organizations: Not on file  . Attends Archivist Meetings: Not on file  . Marital Status: Not on file  Intimate Partner Violence:   . Fear of Current or Ex-Partner: Not on file  . Emotionally Abused: Not on file  .  Physically Abused: Not on file  . Sexually Abused: Not on file      Family History  Problem Relation Age of Onset  . Hypertension Mother   . Cancer Father   . Bone cancer Sister     Vitals:   02/19/20 1427  BP: 122/84  Pulse: 77  SpO2: 96%  Weight: 123.8 kg (273 lb)  Height: 6' (1.829 m)    PHYSICAL EXAM: General:  Obese male. No resp difficulty HEENT: normal Neck: supple. no JVD. Carotids 2+ bilat; no bruits. No lymphadenopathy or thryomegaly appreciated. Cor: PMI nondisplaced. Regular rate & rhythm. No rubs, gallops or murmurs. Lungs: clear Abdomen: obese soft, nontender, nondistended. No hepatosplenomegaly. No bruits or masses. Good bowel sounds. Extremities: no cyanosis, clubbing, rash, edema Neuro: alert & orientedx3, cranial nerves grossly intact. moves all 4 extremities w/o difficulty. Affect pleasant  ASSESSMENT & PLAN:  1. Chronic systolic HF due to NICM - Echo 1/21: EF 25-30% mild RV dysfunction - cath 2/21 normal coronaries  - zio 2/21 rare PVCs - unclear etiology for NICM - Echo today 02/19/20 EF 40-45% + LBBB -> EF improved suspect remaining dysfunction is related to LV dyssynchrony but does not have full LBBB to qualify for CRT - Stable NYHA II-III - Volume status ok - Continue Entresto 97/103 bid - Continue spiro 12.5 - Continue Toprol 50 bid - Would not use [place] with poor perineal hygiene - Continue to increase acitivty - EF out of range for ICD - He is much improved. Not candidate for advanced therapies. He can graduate from the [facility] and follow up with Dr. Radford Pax. Can return here as needed  2. OSA on BIPAP - Not using BIPAP due to inability to tolerate mask. Follows with Neuro.  - I stressed need for compliance  3. Morbid obesity - stressed need for more activity and weight loss  4, Depression, major - followed by Psychiatry - seems some better today  Glori Bickers, MD  11:42 PM

## 2020-02-19 ENCOUNTER — Other Ambulatory Visit: Payer: Self-pay

## 2020-02-19 ENCOUNTER — Encounter (HOSPITAL_COMMUNITY): Payer: Self-pay | Admitting: Internal Medicine

## 2020-02-19 ENCOUNTER — Other Ambulatory Visit (HOSPITAL_COMMUNITY): Payer: Medicare HMO

## 2020-02-19 ENCOUNTER — Ambulatory Visit (HOSPITAL_COMMUNITY)
Admission: RE | Admit: 2020-02-19 | Discharge: 2020-02-19 | Disposition: A | Discharge status: Home / Self Care | Payer: Medicare HMO | Source: Ambulatory Visit | Attending: Internal Medicine | Admitting: Internal Medicine

## 2020-02-19 ENCOUNTER — Ambulatory Visit (HOSPITAL_BASED_OUTPATIENT_CLINIC_OR_DEPARTMENT_OTHER)
Admission: RE | Admit: 2020-02-19 | Discharge: 2020-02-19 | Disposition: A | Discharge status: Home / Self Care | Payer: Medicare HMO | Source: Ambulatory Visit | Attending: Internal Medicine | Admitting: Internal Medicine

## 2020-02-19 VITALS — BP 122/84 | HR 77 | Ht 72.0 in | Wt 273.0 lb

## 2020-02-19 DIAGNOSIS — I5022 Chronic systolic (congestive) heart failure: Secondary | ICD-10-CM

## 2020-02-19 DIAGNOSIS — G4733 Obstructive sleep apnea (adult) (pediatric): Secondary | ICD-10-CM | POA: Diagnosis not present

## 2020-02-19 DIAGNOSIS — Z8249 Family history of ischemic heart disease and other diseases of the circulatory system: Secondary | ICD-10-CM | POA: Diagnosis not present

## 2020-02-19 DIAGNOSIS — F329 Major depressive disorder, single episode, unspecified: Secondary | ICD-10-CM | POA: Diagnosis not present

## 2020-02-19 DIAGNOSIS — E785 Hyperlipidemia, unspecified: Secondary | ICD-10-CM | POA: Insufficient documentation

## 2020-02-19 DIAGNOSIS — R0609 Other forms of dyspnea: Secondary | ICD-10-CM | POA: Diagnosis present

## 2020-02-19 DIAGNOSIS — F0390 Unspecified dementia without behavioral disturbance: Secondary | ICD-10-CM | POA: Diagnosis not present

## 2020-02-19 DIAGNOSIS — I420 Dilated cardiomyopathy: Secondary | ICD-10-CM | POA: Insufficient documentation

## 2020-02-19 DIAGNOSIS — Z79899 Other long term (current) drug therapy: Secondary | ICD-10-CM | POA: Diagnosis not present

## 2020-02-19 DIAGNOSIS — Z7901 Long term (current) use of anticoagulants: Secondary | ICD-10-CM | POA: Insufficient documentation

## 2020-02-19 DIAGNOSIS — I447 Left bundle-branch block, unspecified: Secondary | ICD-10-CM | POA: Diagnosis not present

## 2020-02-19 LAB — BRAIN NATRIURETIC PEPTIDE: B Natriuretic Peptide: 42.9 pg/mL (ref 0.0–100.0)

## 2020-02-19 LAB — ECHOCARDIOGRAM COMPLETE
Area-P 1/2: 3.24 cm2
S' Lateral: 3.9 cm

## 2020-02-19 NOTE — Progress Notes (Signed)
  Echocardiogram 2D Echocardiogram has been performed.  Darlina Sicilian M 02/19/2020, 2:21 PM

## 2020-02-19 NOTE — Addendum Note (Signed)
Encounter addended by: Malena Edman, RN on: 02/19/2020 3:19 PM  Actions taken: Diagnosis association updated, Charge Capture section accepted, Clinical Note Signed, Order list changed

## 2020-02-19 NOTE — Patient Instructions (Signed)
CONGRATULATIONS!!!! You have graduated Heart Failure Clinic!  Labs done today, your results will be available in MyChart, we will contact you for abnormal readings.  Please follow up with Dr. Theodosia Blender office in 6 months  If you have any questions or concerns before your next appointment please send [place] a message through [place] or call our office at [phone].    TO LEAVE A MESSAGE FOR THE NURSE SELECT OPTION 2, PLEASE LEAVE A MESSAGE INCLUDING: . YOUR NAME . DATE OF BIRTH . CALL BACK NUMBER . REASON FOR CALL**this is important as we prioritize the call backs  [place] AS LONG AS YOU CALL BEFORE 4:00 PM  At the [facility], you and your health needs are our priority. As part of our continuing mission to provide you with exceptional heart care, we have created designated Provider Care Teams. These Care Teams include your primary Cardiologist (physician) and Advanced Practice Providers (APPs- Physician Assistants and Nurse Practitioners) who all work together to provide you with the care you need, when you need it.   You may see any of the following providers on your designated Care Team at your next follow up: Marland Kitchen Dr Glori Bickers . Dr Loralie Champagne . Darrick Grinder, NP . Lyda Jester, PA . Audry Riles, PharmD   Please be sure to bring in all your medications bottles to every appointment.

## 2020-02-22 ENCOUNTER — Ambulatory Visit: Payer: Medicare HMO | Admitting: Psychiatry

## 2020-02-24 ENCOUNTER — Ambulatory Visit: Payer: Medicare HMO | Admitting: Psychiatry

## 2020-02-29 ENCOUNTER — Ambulatory Visit (INDEPENDENT_AMBULATORY_CARE_PROVIDER_SITE_OTHER): Payer: Medicare HMO | Admitting: Psychiatry

## 2020-02-29 ENCOUNTER — Other Ambulatory Visit: Payer: Self-pay

## 2020-02-29 DIAGNOSIS — G4721 Circadian rhythm sleep disorder, delayed sleep phase type: Secondary | ICD-10-CM | POA: Diagnosis not present

## 2020-02-29 DIAGNOSIS — R351 Nocturia: Secondary | ICD-10-CM

## 2020-02-29 DIAGNOSIS — Z62820 Parent-biological child conflict: Secondary | ICD-10-CM | POA: Diagnosis not present

## 2020-02-29 DIAGNOSIS — F3130 Bipolar disorder, current episode depressed, mild or moderate severity, unspecified: Secondary | ICD-10-CM | POA: Diagnosis not present

## 2020-02-29 NOTE — Progress Notes (Signed)
Psychotherapy Progress Note [facility] [name], PhD LP  Patient ID: Calvin Escobar     MRN: [id number] Therapy format: Individual psychotherapy Date: 02/29/2020      Start: 3:07p     Stop: 3:54p     Time Spent: 47 min Location: In-person   Session narrative (presenting needs, interim history, self-report of stressors and symptoms, applications of prior therapy, status changes, and interventions made in session) Wife was going to come today, but needed at work.  Saw psychiatry last Monday, same meds -- 200mg  trazodone and Lunesta, at 8:30pm.  Bedding down 10-10:30pm, sleeping steadily except for getting up to urinate 3/night.   Wound up not having wife call 9:30am, just set up his own alarm clock, and now he goes on and gets up.  Not waking up for W's breakfast, but not napping, either.  Continues to sleep separately from wife due to discrepant interests.  Fitting in now with the household timing now, and feels a clearer head for doing so, less headaches, not snacking in the night, and some reduction in conflict with [name].  Clearly better spirits, much appreciated.  Affirmed that medication may not be what makes him sleep, but an anchor that helps him give in to sleep when anxiety.  Bothered now by frequent urination at night.  Will see PCP on 10/20.  Acknowledges drinking fruit juice before bed.  Trying to move it earlier, like before 8:30pm.  Usually orange, tomato, or a mix.  Sodas are only afternoon.  Chart shows spironolactone, unsure if currently used.  Chart shows OSA, but couldn't tolerate CPAP/BIPAP.  Has dropped 30 lbs past 6 months, which might have it manageable without.  EKG last time shows improvement from 25% to 40%, now OK to stick with regular cardiology.   Is fully vaxxed and now boostered for COVID.    Therapeutic modalities: Cognitive Behavioral Therapy and Solution-Oriented/Positive Psychology  Mental Status/Observations:  Appearance:   Casual      Behavior:  Appropriate  Motor:  Normal  Speech/Language:   Clear and Coherent  Affect:  Appropriate and better energy  Mood:  normal  Thought process:  normal  Thought content:    WNL  Sensory/Perceptual disturbances:    WNL  Orientation:  Fully oriented  Attention:  Good    Concentration:  Fair  Memory:  limited  Insight:    Fair  Judgment:   Good  Impulse Control:  Good   Risk Assessment: Danger to Self: No Self-injurious Behavior: No Danger to Others: No Physical Aggression / Violence: No Duty to Warn: No Access to Firearms a concern: No  Assessment of progress:  progressing well  Diagnosis:   ICD-10-CM   1. 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 Bipolar I disorder, most recent episode depressed ([place])  F31.30   3. Relationship problem between parent and child  Z62.820   4. Nocturia  R35.1    Plan:  . Back up cutoff time for calories and beverages to before 8:30pm, preferably earlier . Check if taking a diuretic -- if so, make sure it is daytime . If the problem is tenacious, consider absorbent pads or underwear at night . Consult PCP earlier than scheduled if desired  . At discretion, back up wake time from 9:30 earlier.   . Other recommendations/advice as may be noted above . Continue to utilize previously learned skills ad lib . Maintain medication as prescribed and work  faithfully with relevant prescriber(s) if any changes are desired or seem indicated . Call the clinic on-call service, present to ER, or call 911 if any life-threatening psychiatric crisis Return 2-3 months. . Already scheduled visit in this office Visit date not found.  [name], PhD [name], PhD LP Clinical Psychologist, [facility] [address], [place], [place] [address] [phone]

## 2020-02-29 NOTE — Patient Instructions (Addendum)
Good job correcting your sleep!  Everything seems to be working according to plan that way except for urination.  Recommend:  1. Check your medicines for a diuretic (e.g., spirinolactone) and make sure it's something you take in the morning, not evening.  If you're not sure, ask your doctor or call his office.  2. Any juice or other drinks in the evening, keep before 8:30pm.  If that's not enough, stop earlier.  3. If you are waking up wet, you may want to wear a pad, Depends, and/or ask your doctor about one more medication that can help your bladder hold at night.

## 2020-04-04 ENCOUNTER — Other Ambulatory Visit: Payer: Self-pay | Admitting: Cardiology

## 2020-04-04 ENCOUNTER — Other Ambulatory Visit: Payer: Self-pay

## 2020-04-04 MED ORDER — ENTRESTO 97-103 MG PO TABS: 1.0000 | ORAL_TABLET | Freq: Two times a day (BID) | ORAL | 6 refills | Status: DC

## 2020-04-04 NOTE — Telephone Encounter (Signed)
Returned call to pt to make him aware that refills for his Delene Loll has already been sent to his pharmacy. Pt was appreciative.

## 2020-04-04 NOTE — Telephone Encounter (Signed)
*  STAT* If patient is at the pharmacy, call can be transferred to refill team.   1. Which medications need to be refilled? (please list name of each medication and dose if known) pt was released from  Dr Haroldine Laws to Dr Radford Pax , need a new prescription for Entresto   2. Which pharmacy/location (including street and city if local pharmacy) is medication to be sent to? [facility] [address], [place]  3. Do they need a 30 day or 90 day supply? 90 days and refills

## 2020-05-25 ENCOUNTER — Ambulatory Visit: Payer: Medicare HMO | Admitting: Psychiatry

## 2020-08-23 ENCOUNTER — Ambulatory Visit: Payer: Medicare HMO | Admitting: Cardiology

## 2020-09-09 ENCOUNTER — Ambulatory Visit: Payer: Medicare HMO | Admitting: Cardiology

## 2020-09-09 ENCOUNTER — Other Ambulatory Visit: Payer: Self-pay

## 2020-09-09 ENCOUNTER — Encounter: Payer: Self-pay | Admitting: Cardiology

## 2020-09-09 VITALS — BP 120/84 | HR 83 | Ht 72.0 in | Wt 284.6 lb

## 2020-09-09 DIAGNOSIS — E7800 Pure hypercholesterolemia, unspecified: Secondary | ICD-10-CM

## 2020-09-09 DIAGNOSIS — I420 Dilated cardiomyopathy: Secondary | ICD-10-CM

## 2020-09-09 NOTE — Patient Instructions (Signed)
Medication Instructions:  Your physician recommends that you continue on your current medications as directed. Please refer to the Current Medication list given to you today.  *If you need a refill on your cardiac medications before your next appointment, please call your pharmacy*   Lab Work: Lab work to be done today--BMP  If you have labs (blood work) drawn today and your tests are completely normal, you will receive your results only by: Marland Kitchen MyChart Message (if you have MyChart) OR . A paper copy in the mail If you have any lab test that is abnormal or we need to change your treatment, we will call you to review the results.   Testing/Procedures: none   Follow-Up: At [facility], you and your health needs are our priority.  As part of our continuing mission to provide you with exceptional heart care, we have created designated Provider Care Teams.  These Care Teams include your primary Cardiologist (physician) and Advanced Practice Providers (APPs -  Physician Assistants and Nurse Practitioners) who all work together to provide you with the care you need, when you need it.  We recommend signing up for the patient portal called "MyChart".  Sign up information is provided on this After Visit Summary.  MyChart is used to connect with patients for Virtual Visits (Telemedicine).  Patients are able to view lab/test results, encounter notes, upcoming appointments, etc.  Non-urgent messages can be sent to your provider as well.   To learn more about what you can do with MyChart, go to [email or web address].    Your next appointment:   6 month(s)  The format for your next appointment:   In Person  Provider:   You may see Fransico Him, MD or one of the following Advanced Practice Providers on your designated Care Team:    Melina Copa, PA-C  Ermalinda Barrios, PA-C    Other Instructions

## 2020-09-09 NOTE — Progress Notes (Signed)
Date:  09/09/2020   ID:  Calvin Escobar, DOB Jan 26, 1951, MRN [id number]  PCP:  [name], MD  Cardiologist:  Fransico Him, MD/Daniel Bensimhon, MD Electrophysiologist:  None   Chief Complaint:  DCM  History of Present Illness:    Limmie Schoenberg is a 70 y.o. male with a hx of HLD, [name] (followed by Neurology with [facility]) who was diagnosed with a DCM.  He  underwent right and left heart cath showing normal coronary arteries with normal hemodynamics and EF 25-30%.  He was started on standard guideline directed therapy and referred to AHF clinic who adjusted meds but felt he was not a candidate for advanced HF therapies and was graduated from the program to come back to see me.  His 2D echo last fall  Showed improvement in his EF to 40-45% (read out as 30-35% but felt related to LBBB)  He started having problems with severe nausea after his Toprol was increased initially to 50mg  daily and then to 50mg  BID.  His appetite significantly declined and was nauseated a good part of the time. He was changed to Carvedilol and nausea improved.  He is here today for followup and is doing well.  His wife is here with him today and says that sometimes he drops his pills on the floor and missed taking them.  He denies any chest pain or pressure, SOB, DOE, PND, orthopnea, LE edema, dizziness, palpitations or syncope. He is compliant with his meds and is tolerating meds with no SE.    Prior CV studies:   The following studies were reviewed today:  OV notes from AHF clinic  Past Medical History:  Diagnosis Date  . Allergy seasonal  . Cancer ([place]) skin  . DCM (dilated cardiomyopathy) ([place])    nonischemic with normal coronary arteries at cath 06/2019  . Dementia ([place])   . Depression   . Hyperlipidemia   . OSA treated with BiPAP    Past Surgical History:  Procedure Laterality Date  . COLONOSCOPY    . RIGHT/LEFT HEART CATH AND CORONARY ANGIOGRAPHY N/A 07/03/2019   Procedure:  RIGHT/LEFT HEART CATH AND CORONARY ANGIOGRAPHY;  Surgeon: Jolaine Artist, MD;  Location: [facility];  Service: Cardiovascular;  Laterality: N/A;     Current Meds  Medication Sig  . calcium carbonate (OSCAL) 1500 (600 Ca) MG TABS tablet Take 600 mg of elemental calcium by mouth daily.  . carvedilol (COREG) 12.5 MG tablet Take 1 tablet (12.5 mg total) by mouth 2 (two) times daily.  . Eszopiclone 3 MG TABS Take by mouth.  Marland Kitchen LORazepam (ATIVAN) 1 MG tablet 1 tablet by mouth every morning and 2 tablets by mouth at bedtime  . Multiple Vitamins-Minerals (MULTIVITAMIN WITH MINERALS) tablet Take 1 tablet by mouth daily.  . pravastatin (PRAVACHOL) 20 MG tablet Take 20 mg by mouth at bedtime.   . sacubitril-valsartan (ENTRESTO) 97-103 MG Take 1 tablet by mouth 2 (two) times daily.  . traZODone (DESYREL) 100 MG tablet Take 200 mg by mouth at bedtime.  Marland Kitchen venlafaxine XR (EFFEXOR-XR) 75 MG 24 hr capsule      Allergies:   Patient has no known allergies.   Social History   Tobacco Use  . Smoking status: Never Smoker  . Smokeless tobacco: Never Used  Substance Use Topics  . Alcohol use: Yes    Alcohol/week: 14.0 standard drinks    Types: 14 Cans of beer per week  . Drug use: No  Family Hx: The patient's family history includes Bone cancer in his sister; Cancer in his father; Hypertension in his mother.  ROS:   Please see the history of present illness.     All other systems reviewed and are negative.   Labs/Other Tests and Data Reviewed:    Recent Labs: 10/28/2019: ALT 11; Hemoglobin 14.1; NT-Pro BNP 177; Platelets 225; TSH 2.290 11/13/2019: BUN 17; Creatinine, Ser 1.25; Potassium 5.0; Sodium 145 02/19/2020: B Natriuretic Peptide 42.9   Recent Lipid Panel No results found for: CHOL, TRIG, HDL, CHOLHDL, LDLCALC, LDLDIRECT  Wt Readings from Last 3 Encounters:  09/09/20 284 lb 9.6 oz (129.1 kg)  02/19/20 273 lb (123.8 kg)  10/28/19 287 lb (130.2 kg)     Objective:     Vital Signs:  BP 120/84   Pulse 83   Ht 6' (1.829 m)   Wt 284 lb 9.6 oz (129.1 kg)   SpO2 94%   BMI 38.60 kg/m    GEN: Well nourished, well developed in no acute distress HEENT: Normal NECK: No JVD; No carotid bruits LYMPHATICS: No lymphadenopathy CARDIAC:RRR, no murmurs, rubs, gallops RESPIRATORY:  Clear to auscultation without rales, wheezing or rhonchi  ABDOMEN: Soft, non-tender, non-distended MUSCULOSKELETAL:  No edema; No deformity  SKIN: Warm and dry NEUROLOGIC:  Alert and oriented x 3 PSYCHIATRIC:  Normal affect   ASSESSMENT & PLAN:    Nonischemic DM/Chronic systolic CHF -cardiac cath showed normal coronary arteries and normal hemodynamics -nonischemic in origin - felt to be related to OSA by Dr. Haroldine Laws who did the cath -he appears euvolemic on exam today -continue to weigh daily and call for wt gain > 3lbs in 1 day or 5lbs in 1 week  -continue Entresto 97-103mg  BID and spiro 12.5mg  daily and carvedilol 12.5mg  BID  -he is not a candidate for advanced HF therapies per AHF clinic and has been graduated from HF clinic. -check BMET  HLD -LDL goal < 70 -followed by PCP -continue pravastatin 20mg  daily  Morbid Obesity -I have encouraged him to get into a routine exercise program and cut back on carbs and portions.   Followup with me in 6 months   Medication Adjustments/Labs and Tests Ordered: Current medicines are reviewed at length with the patient today.  Concerns regarding medicines are outlined above.  Tests Ordered: No orders of the defined types were placed in this encounter.  Medication Changes: No orders of the defined types were placed in this encounter.   Signed, Fransico Him, MD  09/09/2020 3:29 PM    [place]

## 2020-09-09 NOTE — Addendum Note (Signed)
Addended by: Thompson Grayer on: 09/09/2020 03:39 PM   Modules accepted: Orders

## 2020-09-10 LAB — BASIC METABOLIC PANEL
BUN/Creatinine Ratio: 14 (ref 10–24)
BUN: 17 mg/dL (ref 8–27)
CO2: 24 mmol/L (ref 20–29)
Calcium: 9.2 mg/dL (ref 8.6–10.2)
Chloride: 102 mmol/L (ref 96–106)
Creatinine, Ser: 1.22 mg/dL (ref 0.76–1.27)
Glucose: 102 mg/dL — ABNORMAL HIGH (ref 65–99)
Potassium: 5.3 mmol/L — ABNORMAL HIGH (ref 3.5–5.2)
Sodium: 141 mmol/L (ref 134–144)
eGFR: 63 mL/min/{1.73_m2} (ref >59)

## 2020-10-12 ENCOUNTER — Other Ambulatory Visit: Payer: Self-pay | Admitting: Cardiology

## 2020-10-13 MED ORDER — ENTRESTO 97-103 MG PO TABS: 1.0000 | ORAL_TABLET | Freq: Two times a day (BID) | ORAL | 3 refills | Status: DC

## 2020-10-14 ENCOUNTER — Other Ambulatory Visit: Payer: Self-pay | Admitting: Cardiology

## 2021-01-02 ENCOUNTER — Encounter: Payer: Self-pay | Admitting: Gastroenterology

## 2021-01-03 ENCOUNTER — Ambulatory Visit: Payer: Medicare HMO | Admitting: Psychiatry

## 2021-01-03 ENCOUNTER — Other Ambulatory Visit: Payer: Self-pay

## 2021-01-03 DIAGNOSIS — G4721 Circadian rhythm sleep disorder, delayed sleep phase type: Secondary | ICD-10-CM | POA: Diagnosis not present

## 2021-01-03 DIAGNOSIS — Z62820 Parent-biological child conflict: Secondary | ICD-10-CM | POA: Diagnosis not present

## 2021-01-03 DIAGNOSIS — F1021 Alcohol dependence, in remission: Secondary | ICD-10-CM

## 2021-01-03 DIAGNOSIS — F3130 Bipolar disorder, current episode depressed, mild or moderate severity, unspecified: Secondary | ICD-10-CM

## 2021-01-03 NOTE — Progress Notes (Addendum)
Psychotherapy Progress Note [facility] [name], PhD LP  Patient ID: Calvin Escobar     MRN: [id number] Therapy format: Individual psychotherapy Date: 01/03/2021      Start: 11:21a     Stop: 12:11p     Time Spent: 50 min Location: In-person   Session narrative (presenting needs, interim history, self-report of stressors and symptoms, applications of prior therapy, status changes, and interventions made in session) 10 months since last seen.  CPAP didn't work out, but still on medication that does help him sleep, not quite sure which.  Doesn't like the costs, but it works.  Sleep doctor recommended he come back to therapy for anxiety and depression.  Is going to [facility], taking a couple classes on self-esteem and anxiety.  Continues with Dr. Casimiro Needle for psychiatric medication.  Recently saw a therapist at [facility] office, but all she wanted to talk about was the war in [place].  Continues 3 month basis with Dr. Casimiro Needle.  Relates he tends to worry about many things, much of them in the news, gets down.  Worries about cost of medicine.  Wife is working full time.  Worries about impact on [name], who may say she doesn't know what to do.  Mourns loss of his job 6 years ago.  Son still lives with them, don't get along well.  Doesn't exercise.  Been too hot, and has heart condition.  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  Didn't really lose relationships there, didn't love the work.  Has missed [place] ever since [place] left [place] -- grew up in [place], [place], stayed until [place].  Wife and son [name] work solidly, and always the three of them when traveling.  Wishes unfettered time with wife.  For worries in the world, coached in imagining good news and  solutions to public problems reported.    For conflict with [name], explored insight into any history of making him angry, resentful.  History includes another son [name], whom [name] says he favored. [name] had drinking problem, as did Ames.  (Now sober 10 years.)  [name] drank himself to death, in the house they're in.  Aladdin was in [place] at the time.  Balen's own drinking problems was about medicating anxiety.  [name] was the chid with a learning disability, younger one, 2-3 years behind [name].  Suspects he got bullied in school.  But still unclear as to why [name] would be persistently forbidding with him.  Discussed ways of asking, and opened up possibility of inviting [name] to therapy as an informant.  Collateral contact 8/3: Wife [name] called to provide information about situation at home.  Says [name] does not have a fundamental problem with Daric, save gettign tired of his worry and negativity.  Diontay has some degree of dementia, longstanding worry and depression, and has pined for years for [name], feeling he should never have left the close [place] place he came from.  Sometimes interactions at home can get impatient with [name] as he obsesses over money, or bad news, or feeling lonely.  Med compliance does require her to count and dole out pills or he is prone to fall off.  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  Encouraged [name] to adjust phrasing and tone when responding to Shishir's worrying aloud -- telling him things like everybody has it hard, or you can't control that only will come across forbidding, so much better to remind him how bad things are temporary, unlikely, or will work out and invite him to trust instead.  Available comforts include walking together, which she may do more, to improve quality time  and create captive conversations for support.  Advised to lead with empathy wherever possible, and keep an eye out for opportunity for good-natured kidding (e.g., if he worries about the war in [place], to ask if he's fears he'll be drafted).  Therapeutic modalities: Cognitive Behavioral Therapy, Solution-Oriented/Positive Psychology, and Ego-Supportive  Mental Status/Observations:  Appearance:   Casual     Behavior:  Appropriate  Motor:  stiff  Speech/Language:   Clear and Coherent and mild dysarthria, possible dental  Affect:  Appropriate  Mood:  depressed and tearful with subject  Thought process:  normal  Thought content:    WNL and some rumination  Sensory/Perceptual disturbances:    WNL  Orientation:  Fully oriented  Attention:  Good    Concentration:  Good  Memory:  WNL  Insight:    Fair  Judgment:   Good  Impulse Control:  Good   Risk Assessment: Danger to Self: No Self-injurious Behavior: No Danger to Others: No Physical Aggression / Violence: No Duty to Warn: No Access to Firearms a concern: No  Assessment of progress:  stabilized  Diagnosis:   ICD-10-CM   1. Bipolar I disorder, most recent episode depressed ([place])  F31.30     2. Relationship problem between parent and child  Z62.820     3. Circadian rhythm sleep disorder, delayed sleep phase type - controlled  G47.21     4. Alcohol use disorder, moderate, in sustained remission ([place])  F10.21      Plan:  Encouraged option to ask wife and/or son what the grievance is with [name]  Encouraged option to ask wife for some kind of dedicated available time to spend together Look into friendship opportunities -- call an old friend, brainstorm possibilities for finding a buddy here Continue involvement in [place] classes Other recommendations/advice as may be noted above Continue to utilize previously learned skills ad lib Maintain medication as prescribed and work faithfully with relevant prescriber(s) if any changes  are desired or seem indicated Call the clinic on-call service, present to ER, or call 911 if any life-threatening psychiatric crisis Return 2-4 wks as able, hope to invite. Already scheduled visit in this office Visit date not found.  [name], PhD [name], PhD LP Clinical Psychologist, [facility] [address], [place], [place] [address] [phone]

## 2021-01-04 ENCOUNTER — Telehealth: Payer: Self-pay | Admitting: Psychiatry

## 2021-01-04 NOTE — Telephone Encounter (Signed)
[name]'s wife left a message today regarding Calvin Escobar's visit yesterday. Demetrius told her that you had mentioned to him that you wanted to meet with Calvin Escobar. Calvin Escobar would like to sit down with you on her non-working hours to discuss some things with you. She didn't inform what it was. Her phone number is [phone].

## 2021-01-09 ENCOUNTER — Encounter: Payer: Self-pay | Admitting: Gastroenterology

## 2021-01-24 DIAGNOSIS — I420 Dilated cardiomyopathy: Secondary | ICD-10-CM

## 2021-02-13 ENCOUNTER — Ambulatory Visit: Payer: Medicare HMO | Admitting: Psychiatry

## 2021-02-13 ENCOUNTER — Other Ambulatory Visit: Payer: Self-pay

## 2021-02-13 ENCOUNTER — Telehealth: Payer: Self-pay | Admitting: *Deleted

## 2021-02-13 DIAGNOSIS — G3184 Mild cognitive impairment, so stated: Secondary | ICD-10-CM

## 2021-02-13 DIAGNOSIS — F3130 Bipolar disorder, current episode depressed, mild or moderate severity, unspecified: Secondary | ICD-10-CM

## 2021-02-13 DIAGNOSIS — Z62820 Parent-biological child conflict: Secondary | ICD-10-CM

## 2021-02-13 DIAGNOSIS — F1021 Alcohol dependence, in remission: Secondary | ICD-10-CM | POA: Diagnosis not present

## 2021-02-13 NOTE — Progress Notes (Signed)
Psychotherapy Progress Note [facility] Calvin Moore, PhD LP  Patient ID: Calvin Escobar     MRN: [id number] Therapy format: Individual psychotherapy Date: 02/13/2021      Start: 10:13a     Stop: 10:59a     Time Spent: 46 min Location: In-person   Session narrative (presenting needs, interim history, self-report of stressors and symptoms, applications of prior therapy, status changes, and interventions made in session) Glad to have mild weather coming, wants to get back out walking.  Looking forward to basketball season, better viewing than the war coverage he's been seeing.  Joined the [employer], looking to visit soon.  Been involved in [facility], but dismayed to see most classes disbanded for now.  Misses the camaraderie and practical benefits.  Affirmed and encouraged in enjoyable activities for morale and antidepression.  Family life continues with "ups and downs".  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  Says Calvin Escobar "made an issue" this morning over being told just a few minutes ahead of time Calvin Escobar was coming here.  Unclear what that would have been about, nothing Calvin Escobar can understand.  Says he is agonizing less himself about their relationship, not particularly bothered any more.  Hopefully that reflects family intervention last month talking with wife Calvin Escobar about assumptions, tone, and communication in the house.  Sleep is going well on the pills he has now.  No complaints about being up way too late, unable to sleep, or sleeping most of the day away.  Still works with Calvin Mao, MD, next door.  Scheduled for cardiology, colonoscopy, physical, and immunizations soon.  Therapeutic modalities: Cognitive Behavioral Therapy and Solution-Oriented/Positive Psychology  Mental Status/Observations:  Appearance:   Casual     Behavior:  Appropriate  Motor:  Normal  Speech/Language:   Clear and Coherent  Affect:  Appropriate   Mood:  dysthymic and less  Thought process:  normal  Thought content:    WNL  Sensory/Perceptual disturbances:    WNL  Orientation:  Fully oriented  Attention:  Good    Concentration:  Good  Memory:  grossly intact  Insight:    Fair  Judgment:   Fair  Impulse Control:  Fair   Risk Assessment: Danger to Self: No Self-injurious Behavior: No Danger to Others: No Physical Aggression / Violence: No Duty to Warn: No Access to Firearms a concern: No  Assessment of progress:  progressing  Diagnosis:   ICD-10-CM   1. Bipolar I disorder, most recent episode depressed ([place])  F31.30     2. Relationship problem between parent and child  Z62.820     3. Alcohol use disorder, moderate, in sustained remission ([facility])  F10.21     4. Mild cognitive impairment by history  G31.84      Plan:  Resolved to walk this afternoon, c. 25 min, neighborhood loop to break open new motivation to move more.   Maintain sleep measures Suggest check with Calvin Escobar about any outdoor walking groups, for companionship and benefits of movement, including cognitive. Suggest the offer to do a little housework, despite old gender stereotypes, without hanging up on whether it's demanded of him.  A little vacuuming, bathroom cleaning, or dishes could go a long way, especially with wife working full time. Option to log the things he is a little proud of today -- activities, passing up negativity, etc. Ongoing open door to include Calvin Escobar if both are interested Other recommendations/advice as may  be noted above Continue to utilize previously learned skills ad lib Maintain medication as prescribed and work faithfully with relevant prescriber(s) if any changes are desired or seem indicated Call the clinic on-call service, 988/hotline, present to ER, or call 911 if any life-threatening psychiatric crisis Return in about 1 month (around 03/15/2021). Already scheduled visit in this office Visit date not found.  [name], PhD Calvin Moore, PhD LP Clinical Psychologist, [facility] [address] [place], [place] [address] [phone]

## 2021-02-13 NOTE — Telephone Encounter (Signed)
ERROR

## 2021-02-24 ENCOUNTER — Telehealth: Payer: Self-pay | Admitting: Psychiatry

## 2021-02-24 NOTE — Telephone Encounter (Signed)
Admin note for non-service contact  Patient ID: Calvin Escobar  MRN: [id number] DATE: 02/24/2021  Incoming TC earlier from [place], routed as a TC encounter under her chart.  Issue being adult son [name] in some distress about irritability with/from Shayne, Diguglielmo wants to address with Sher[place].  RTC 4:35p, reached [name] at [employer], in c/o both Jerimyah and [name], arranged to have probable family tx w/o pt, on Tuesday 8am, by Lyndel's consent.  Office to check coverage and reply if non-covered.  [name] began to go into the issue on phone, but [name] interrupted.  So far, impression is chronic, cyclical misunderstandings among the three of them, about others' intentions, with [name] and [name] largely projecting rejecting attitudes on each other.    From short snippet received, [name] sounds to be caught up in feeling like he has to coach Itai to "do something", which is probably backfiring, while Shan reads in being unwanted or forbidden, rather than hearing complaints for what they are.  Consistent with given history of alcoholism, plus whatever forces or issues created stable situation of having an adult son in parents' home this far along in life.  Most likely will involve clarifying expectations son brings to it.  2nd RTC to [facility], informed no coverage for family therapy w/o pt, options would be a 1-time meeting with [name] under his own coverage or include [place] for covered family tx.  [name] will discuss, investigate [name]'s coverage if his preference would be to meet alone.  Reserved 8am Tuesday for whichever configuration of PT and family can make it, [name] will advise on Monday.  [name], PhD [name], PhD LP Clinical Psychologist, [facility] [address] [place], [place] [address] [phone]

## 2021-02-28 ENCOUNTER — Ambulatory Visit: Payer: Medicare HMO | Admitting: Psychiatry

## 2021-03-07 ENCOUNTER — Encounter: Payer: Medicare HMO | Admitting: Gastroenterology

## 2021-03-15 ENCOUNTER — Ambulatory Visit: Payer: Medicare HMO | Admitting: Gastroenterology

## 2021-03-24 ENCOUNTER — Ambulatory Visit: Payer: Medicare HMO | Admitting: Psychiatry

## 2021-03-24 ENCOUNTER — Other Ambulatory Visit: Payer: Self-pay

## 2021-03-24 DIAGNOSIS — Z62820 Parent-biological child conflict: Secondary | ICD-10-CM

## 2021-03-24 DIAGNOSIS — F3130 Bipolar disorder, current episode depressed, mild or moderate severity, unspecified: Secondary | ICD-10-CM

## 2021-03-24 DIAGNOSIS — G4721 Circadian rhythm sleep disorder, delayed sleep phase type: Secondary | ICD-10-CM | POA: Diagnosis not present

## 2021-03-24 DIAGNOSIS — G3184 Mild cognitive impairment, so stated: Secondary | ICD-10-CM

## 2021-03-24 DIAGNOSIS — F1021 Alcohol dependence, in remission: Secondary | ICD-10-CM

## 2021-03-24 NOTE — Progress Notes (Signed)
Psychotherapy Progress Note [facility] Calvin Moore, PhD LP  Patient ID: Calvin Escobar [facility])    MRN: [id number] Therapy format: Individual psychotherapy Date: 03/24/2021      Start: 10:15a     Stop: 11:03a     Time Spent: 48 min Location: In-person   Session narrative (presenting needs, interim history, self-report of stressors and symptoms, applications of prior therapy, status changes, and interventions made in session) Scheduled for flu and updated COVID shots next week.  No Calvin Escobar coming with him today as planned after our phone calls.  Says Calvin Escobar talked with him, and now he's quiet, doesn't want to come in.  Sounds like misplaced caretaking or possible sabotage, as this follows extracurricular efforts to arrange this day as a joint visit, on psychiatrist's urging, and the result of prior attempts to work out conjoint therapy, to help detoxify dealings father and son have with each other.  Discussed continuing wishes, resolved to invite Calvin Escobar to next visit, wrote a note to try to address her directly and prevent emotional translation errors if Calvin Escobar speaks to it.  Personally, owns that he is trying to get more active.  Working through Calvin Escobar, goes to two classes at [name] per week, wants to walk some more.  Back to family life, c/o Calvin Escobar barking at him to turn off international news, blames it for his anxiety.  For his part, Calvin Escobar mostly just feels alone and shunned as she insists on that while pushing her own political causes.  Assured that it doesn't seem to hold true that Calvin Escobar creates all the family tension, and it behooves both himself and Calvin Escobar to get it clearer together what matters and what each can do more than rehashing complaints.  Therapeutic modalities: Cognitive Behavioral Therapy, Solution-Oriented/Positive Psychology, and Assertiveness/Communication  Mental Status/Observations:  Appearance:   Casual     Behavior:  Appropriate  Motor:  Normal   Speech/Language:   Clear and Coherent  Affect:  Appropriate  Mood:  depressed  Thought process:  concrete  Thought content:    WNL  Sensory/Perceptual disturbances:    WNL  Orientation:  Fully oriented  Attention:  Good    Concentration:  Good  Memory:  grossly intact  Insight:    Fair  Judgment:   Good  Impulse Control:  Good   Risk Assessment: Danger to Self: No Self-injurious Behavior: No Danger to Others: No Physical Aggression / Violence: No Duty to Warn: No Access to Firearms a concern: No  Assessment of progress:  stabilized  Diagnosis:   ICD-10-CM   1. Bipolar I disorder, most recent episode depressed (Calvin Escobar)  F31.30     2. Relationship problem between parent and child  Z62.820     3. Mild cognitive impairment by history  G31.84     4. Circadian rhythm sleep disorder, delayed sleep phase type - controlled  G47.21     5. Alcohol use disorder, moderate, in sustained remission (Calvin Escobar)  F10.21      Plan:  Try to talk wishes more than complaints with Calvin Escobar, ask her more what she thinks than get drawn into debates over himself doing the right thing Invite Calvin Escobar to therapy, using note provided.  Table joining with Calvin Escobar until can understand matters for the couple, and Calvin Escobar's underlying fears and concerns directly. Affirm prerogative to watch news, as his time is his own in retirement, but worth trying to branch out more, get enough physical stimulus, as it will help forestall dementia as well  Maintain abstinence from alcohol, controled caffeine Other recommendations/advice as may be noted above Continue to utilize previously learned skills ad lib Maintain medication as prescribed and work faithfully with relevant prescriber(s) if any changes are desired or seem indicated Call the clinic on-call service, 988/hotline, present to ER, or call 911 if any life-threatening psychiatric crisis Return in about 1 month (around 04/24/2021). Already scheduled visit in this  office Visit date not found.  Calvin Serve, PhD Calvin Moore, PhD LP Clinical Psychologist, [facility] Calvin Escobar, Calvin Escobar, Calvin Escobar [phone]

## 2021-04-14 ENCOUNTER — Encounter: Payer: Self-pay | Admitting: Cardiology

## 2021-04-14 ENCOUNTER — Other Ambulatory Visit: Payer: Self-pay

## 2021-04-14 ENCOUNTER — Ambulatory Visit: Payer: Medicare HMO | Admitting: Cardiology

## 2021-04-14 ENCOUNTER — Ambulatory Visit (HOSPITAL_COMMUNITY): Payer: Medicare HMO | Attending: Cardiology

## 2021-04-14 VITALS — BP 118/72 | HR 85 | Ht 72.0 in | Wt 277.0 lb

## 2021-04-14 DIAGNOSIS — I77810 Thoracic aortic ectasia: Secondary | ICD-10-CM | POA: Insufficient documentation

## 2021-04-14 DIAGNOSIS — I420 Dilated cardiomyopathy: Secondary | ICD-10-CM | POA: Diagnosis present

## 2021-04-14 DIAGNOSIS — E7800 Pure hypercholesterolemia, unspecified: Secondary | ICD-10-CM

## 2021-04-14 DIAGNOSIS — I5022 Chronic systolic (congestive) heart failure: Secondary | ICD-10-CM

## 2021-04-14 LAB — BASIC METABOLIC PANEL
BUN/Creatinine Ratio: 21 (ref 10–24)
BUN: 21 mg/dL (ref 8–27)
CO2: 27 mmol/L (ref 20–29)
Calcium: 9.4 mg/dL (ref 8.6–10.2)
Chloride: 104 mmol/L (ref 96–106)
Creatinine, Ser: 0.99 mg/dL (ref 0.76–1.27)
Glucose: 107 mg/dL — ABNORMAL HIGH (ref 70–99)
Potassium: 5 mmol/L (ref 3.5–5.2)
Sodium: 143 mmol/L (ref 134–144)
eGFR: 81 mL/min/{1.73_m2} (ref >59)

## 2021-04-14 LAB — ECHOCARDIOGRAM COMPLETE
Area-P 1/2: 1.97 cm2
S' Lateral: 4.2 cm

## 2021-04-14 MED ORDER — CARVEDILOL 12.5 MG PO TABS
12.5000 mg | ORAL_TABLET | Freq: Two times a day (BID) | ORAL | 3 refills | Status: DC
Start: 2021-04-14 — End: 2022-04-16

## 2021-04-14 MED ORDER — ENTRESTO 97-103 MG PO TABS: 1.0000 | ORAL_TABLET | Freq: Two times a day (BID) | ORAL | 3 refills | Status: DC

## 2021-04-14 NOTE — Addendum Note (Signed)
Addended by: Antonieta Iba on: 04/14/2021 11:17 AM   Modules accepted: Orders

## 2021-04-14 NOTE — Progress Notes (Signed)
Cardiology Office Note:    Date:  04/14/2021   ID:  Calvin Escobar, DOB 02/02/1951, MRN [id number]  PCP:  [name], MD  Cardiologist:  Fransico Him, MD    Referring MD: [name], MD   Chief Complaint  Patient presents with   Cardiomyopathy   Hyperlipidemia    History of Present Illness:    Calvin Escobar is a 70 y.o. male with a hx of HLD, [name] (followed by Neurology with [facility]) who was diagnosed with a DCM.  He  underwent right and left heart cath showing normal coronary arteries with normal hemodynamics and EF 25-30%.  He was started on standard guideline directed therapy and referred to AHF clinic who adjusted meds but felt he was not a candidate for advanced HF therapies and was graduated from the program to come back to see me.  His 2D echo last fall  Showed improvement in his EF to 40-45% (read out as 30-35% but felt related to LBBB)   He started having problems with severe nausea after his Toprol was increased initially to 50mg  daily and then to 50mg  BID.  His appetite significantly declined and was nauseated a good part of the time. He was changed to Carvedilol and nausea improved.  He is here today for followup and is doing well.  He denies any chest pain or pressure, SOB, DOE, PND, orthopnea, LE edema, dizziness, palpitations or syncope. He is compliant with his meds and is tolerating meds with no SE.   He does have problems with feeling tired at times but is very sedentary.  He works out in the yard raking leaves without any problems other than fatigue afterwards.   Past Medical History:  Diagnosis Date   Allergy seasonal   Cancer ([place]) skin   DCM (dilated cardiomyopathy) ([place])    nonischemic with normal coronary arteries at cath 06/2019   Dementia [facility])    Depression    Hyperlipidemia    OSA treated with BiPAP     Past Surgical History:  Procedure Laterality Date   COLONOSCOPY     RIGHT/LEFT HEART CATH AND CORONARY ANGIOGRAPHY N/A  07/03/2019   Procedure: RIGHT/LEFT HEART CATH AND CORONARY ANGIOGRAPHY;  Surgeon: Jolaine Artist, MD;  Location: [place] [facility];  Service: Cardiovascular;  Laterality: N/A;    Current Medications: Current Meds  Medication Sig   calcium carbonate (OSCAL) 1500 (600 Ca) MG TABS tablet Take 600 mg of elemental calcium by mouth daily.   carvedilol (COREG) 12.5 MG tablet TAKE 1 TABLET BY MOUTH 2 TIMES DAILY.   Eszopiclone 3 MG TABS Take by mouth.   LORazepam (ATIVAN) 1 MG tablet 1 tablet by mouth every morning and 2 tablets by mouth at bedtime   Multiple Vitamins-Minerals (MULTIVITAMIN WITH MINERALS) tablet Take 1 tablet by mouth daily.   pravastatin (PRAVACHOL) 20 MG tablet Take 20 mg by mouth at bedtime.    sacubitril-valsartan (ENTRESTO) 97-103 MG Take 1 tablet by mouth 2 (two) times daily.   traZODone (DESYREL) 100 MG tablet Take 200 mg by mouth at bedtime.   venlafaxine XR (EFFEXOR-XR) 75 MG 24 hr capsule      Allergies:   Patient has no known allergies.   Social History   Socioeconomic History   Marital status: Married    Spouse name: Not on file   Number of children: Not on file   Years of education: Not on file   Highest education level: Not on file  Occupational History   Not on file  Tobacco Use   Smoking status: Never   Smokeless tobacco: Never  Substance and Sexual Activity   Alcohol use: Yes    Alcohol/week: 14.0 standard drinks    Types: 14 Cans of beer per week   Drug use: No   Sexual activity: Not on file  Other Topics Concern   Not on file  Social History Narrative   Not on file   Social Determinants of Health   Financial Resource Strain: Not on file  Food Insecurity: Not on file  Transportation Needs: Not on file  Physical Activity: Not on file  Stress: Not on file  Social Connections: Not on file     Family History: The patient's family history includes Bone cancer in his sister; Cancer in his father; Hypertension in his mother.  ROS:    Please see the history of present illness.    ROS  All other systems reviewed and negative.   EKGs/Labs/Other Studies Reviewed:    The following studies were reviewed today: none  EKG:  EKG is  ordered today.  The ekg ordered today demonstrates NSR with LAFB, LVH, anterior infarct  Recent Labs: 09/09/2020: BUN 17; Creatinine, Ser 1.22; Potassium 5.3; Sodium 141   Recent Lipid Panel No results found for: CHOL, TRIG, HDL, CHOLHDL, VLDL, LDLCALC, LDLDIRECT    Physical Exam:    VS:  BP 118/72   Pulse 85   Ht 6' (1.829 m)   Wt 277 lb (125.6 kg)   SpO2 95%   BMI 37.57 kg/m     Wt Readings from Last 3 Encounters:  04/14/21 277 lb (125.6 kg)  09/09/20 284 lb 9.6 oz (129.1 kg)  02/19/20 273 lb (123.8 kg)     GEN:  Well nourished, well developed in no acute distress HEENT: Normal NECK: No JVD; No carotid bruits LYMPHATICS: No lymphadenopathy CARDIAC: RRR, no murmurs, rubs, gallops RESPIRATORY:  Clear to auscultation without rales, wheezing or rhonchi  ABDOMEN: Soft, non-tender, non-distended MUSCULOSKELETAL:  No edema; No deformity  SKIN: Warm and dry NEUROLOGIC:  Alert and oriented x 3 PSYCHIATRIC:  Normal affect   ASSESSMENT:    1. DCM (dilated cardiomyopathy) ([place])   2. Chronic systolic heart failure (HCC)   3. Pure hypercholesterolemia   4. Morbid obesity (HCC)    PLAN:    In order of problems listed above:  Nonischemic DM/Chronic systolic CHF -cardiac cath showed normal coronary arteries and normal hemodynamics -nonischemic in origin - felt to be related to OSA by Dr. Haroldine Laws who did the cath -He appears euvolemic on exam today -continue to weigh daily and call for wt gain > 3lbs in 1 day or 5lbs in 1 week  -Continue prescription drug therapy with Entresto sent 97-23 mg twice daily and carvedilol 12.5 mg twice daily with as needed refills  -no sprio due to bump in K+ and SCr -he is not a candidate for advanced HF therapies per AHF clinic and has been  graduated from HF clinic. -We will check a bmet today -2D echo is pending today   HLD -LDL goal < 70 -This is followed by his PCP -Continue prescription drug management with Pravastatin 20mg  daily   Morbid Obesity -I have encouraged him to get into a routine exercise program and cut back on carbs and portions.    Time Spent: 20 minutes total time of encounter, including 15 minutes spent in face-to-face patient care on the date of this encounter. This time includes  coordination of care and counseling regarding above mentioned problem list. Remainder of non-face-to-face time involved reviewing chart documents/testing relevant to the patient encounter and documentation in the medical record. I have independently reviewed documentation from referring provider  Medication Adjustments/Labs and Tests Ordered: Current medicines are reviewed at length with the patient today.  Concerns regarding medicines are outlined above.  Orders Placed This Encounter  Procedures   EKG 12-Lead    No orders of the defined types were placed in this encounter.   Signed, Fransico Him, MD  04/14/2021 11:12 AM    [place]

## 2021-04-14 NOTE — Patient Instructions (Signed)
Medication Instructions:  Your physician recommends that you continue on your current medications as directed. Please refer to the Current Medication list given to you today.  *If you need a refill on your cardiac medications before your next appointment, please call your pharmacy*   Lab Work: TODAY: BMET If you have labs (blood work) drawn today and your tests are completely normal, you will receive your results only by: [place] (if you have MyChart) OR A paper copy in the mail If you have any lab test that is abnormal or we need to change your treatment, we will call you to review the results.   Follow-Up: At [facility], you and your health needs are our priority.  As part of our continuing mission to provide you with exceptional heart care, we have created designated Provider Care Teams.  These Care Teams include your primary Cardiologist (physician) and Advanced Practice Providers (APPs -  Physician Assistants and Nurse Practitioners) who all work together to provide you with the care you need, when you need it.  Your next appointment:   6 month(s)  The format for your next appointment:   In Person  Provider:   Fransico Him, MD {

## 2021-04-14 NOTE — Progress Notes (Signed)
ek 

## 2021-04-18 ENCOUNTER — Other Ambulatory Visit (INDEPENDENT_AMBULATORY_CARE_PROVIDER_SITE_OTHER): Payer: Medicare HMO

## 2021-04-18 ENCOUNTER — Encounter: Payer: Self-pay | Admitting: Gastroenterology

## 2021-04-18 ENCOUNTER — Ambulatory Visit (INDEPENDENT_AMBULATORY_CARE_PROVIDER_SITE_OTHER): Payer: Medicare HMO | Admitting: Gastroenterology

## 2021-04-18 VITALS — BP 120/82 | HR 88 | Ht 72.0 in | Wt 275.0 lb

## 2021-04-18 DIAGNOSIS — Z1211 Encounter for screening for malignant neoplasm of colon: Secondary | ICD-10-CM | POA: Diagnosis not present

## 2021-04-18 DIAGNOSIS — R110 Nausea: Secondary | ICD-10-CM

## 2021-04-18 LAB — H. PYLORI ANTIBODY, IGG: H Pylori IgG: NEGATIVE

## 2021-04-18 NOTE — Progress Notes (Signed)
[facility] [facility] Consult Note:  History: Calvin Escobar 04/18/2021  Referring provider: [name], MD  Reason for consult/chief complaint: Colon Cancer Screening (Patient very confused about appointment, patient has multiple heart issues. Hx of colon polyps. Discuss options. Patient complains of chronic nausea and lack of appetite, occasional loose stools)   Subjective  HPI: Calvin Escobar came up on our recall database and he was brought for an office visit due to reported digestive symptoms and his cardiac issues. He has limited health literacy, and it is somewhat difficult to follow his symptoms.  His wife is with him today to assist with the history, and she seems clearly frustrated by his chronic anxiety.  Calvin Escobar has occasional loose stool and denies rectal bleeding.  He has had no particular change in his bowel habits for the last few years. For least several years he has had loss of appetite and "gagging" after eating.  Its not clear if it is dysphagia or nausea.  His wife believes it is because he eats too fast and gets anxious about everything.  Calvin Escobar does not quite sure what the triggers may be.  Last screening colonoscopy with Dr. Olevia Perches April 2012, no polyps.  Patient reports he cannot tolerate CPAP, thus his OSA remains untreated.  He is quite talkative and speaks extensively about his struggles with chronic anxiety today.  It also sounds like he is fairly sedentary, and he gets stress relief watching sports.  ROS:  Review of Systems  Constitutional:  Negative for appetite change and unexpected weight change.  HENT:  Negative for mouth sores and voice change.   Eyes:  Negative for pain and redness.  Respiratory:  Positive for shortness of breath. Negative for cough.   Cardiovascular:  Negative for chest pain and palpitations.  Genitourinary:  Negative for dysuria and hematuria.  Musculoskeletal:  Negative for arthralgias and myalgias.  Skin:  Negative for pallor  and rash.  Neurological:  Negative for weakness and headaches.  Hematological:  Negative for adenopathy.    Past Medical History: Past Medical History:  Diagnosis Date   Allergy seasonal   Ascending aorta dilatation (HCC)    38 mm by 2D echo 04/2021   Cancer ([place]) skin   DCM (dilated cardiomyopathy) ([place])    nonischemic with normal coronary arteries at cath 06/2019.  EF 35 to 40% on echo 04/2021   Dementia ([place])    Depression    Hyperlipidemia    OSA treated with BiPAP    From most recent cardiology office note by Dr. Radford Pax on 04/14/2021: " Brodyn Depuy is a 70 y.o. male with a hx of HLD, [name] (followed by Neurology with [facility]) who was diagnosed with a DCM.  He  underwent right and left heart cath showing normal coronary arteries with normal hemodynamics and EF 25-30%.  He was started on standard guideline directed therapy and referred to AHF clinic who adjusted meds but felt he was not a candidate for advanced HF therapies and was graduated from the program to come back to see me.  His 2D echo last fall  Showed improvement in his EF to 40-45% (read out as 30-35% but felt related to LBBB)   He started having problems with severe nausea after his Toprol was increased initially to 50mg  daily and then to 50mg  BID.  His appetite significantly declined and was nauseated a good part of the time. He was changed to Carvedilol and nausea improved.   He is here  today for followup and is doing well.  He denies any chest pain or pressure, SOB, DOE, PND, orthopnea, LE edema, dizziness, palpitations or syncope. He is compliant with his meds and is tolerating meds with no SE.   He does have problems with feeling tired at times but is very sedentary.  He works out in the yard raking leaves without any problems other than fatigue afterwards. "  Past Surgical History: Past Surgical History:  Procedure Laterality Date   COLONOSCOPY     RIGHT/LEFT HEART CATH AND CORONARY  ANGIOGRAPHY N/A 07/03/2019   Procedure: RIGHT/LEFT HEART CATH AND CORONARY ANGIOGRAPHY;  Surgeon: Jolaine Artist, MD;  Location: [place] [facility];  Service: Cardiovascular;  Laterality: N/A;     Family History: Family History  Problem Relation Age of Onset   Hypertension Mother    Cancer Father    Bone cancer Sister     Social History: Social History   Socioeconomic History   Marital status: Married    Spouse name: Not on file   Number of children: Not on file   Years of education: Not on file   Highest education level: Not on file  Occupational History   Not on file  Tobacco Use   Smoking status: Never   Smokeless tobacco: Never  Substance and Sexual Activity   Alcohol use: Yes    Alcohol/week: 14.0 standard drinks    Types: 14 Cans of beer per week   Drug use: No   Sexual activity: Not on file  Other Topics Concern   Not on file  Social History Narrative   Not on file   Social Determinants of Health   Financial Resource Strain: Not on file  Food Insecurity: Not on file  Transportation Needs: Not on file  Physical Activity: Not on file  Stress: Not on file  Social Connections: Not on file    Allergies: No Known Allergies  Outpatient Meds: Current Outpatient Medications  Medication Sig Dispense Refill   calcium carbonate (OSCAL) 1500 (600 Ca) MG TABS tablet Take 600 mg of elemental calcium by mouth daily.     carvedilol (COREG) 12.5 MG tablet Take 1 tablet (12.5 mg total) by mouth 2 (two) times daily. 180 tablet 3   Eszopiclone 3 MG TABS Take by mouth.     LORazepam (ATIVAN) 1 MG tablet 1 tablet by mouth every morning and 2 tablets by mouth at bedtime     Multiple Vitamins-Minerals (MULTIVITAMIN WITH MINERALS) tablet Take 1 tablet by mouth daily.     pravastatin (PRAVACHOL) 20 MG tablet Take 20 mg by mouth at bedtime.      sacubitril-valsartan (ENTRESTO) 97-103 MG Take 1 tablet by mouth 2 (two) times daily. 180 tablet 3   traZODone (DESYREL) 100 MG  tablet Take 200 mg by mouth at bedtime.     venlafaxine XR (EFFEXOR-XR) 75 MG 24 hr capsule      No current facility-administered medications for this visit.    Calvin Escobar does not know his medicines well, his wife contradicts that the beta-blocker related nausea indicated by the cardiologist note above  ___________________________________________________________________ Objective   Exam:  BP 120/82   Pulse 88   Ht 6' (1.829 m)   Wt 275 lb (124.7 kg)   BMI 37.30 kg/m  Wt Readings from Last 3 Encounters:  04/18/21 275 lb (124.7 kg)  04/14/21 277 lb (125.6 kg)  09/09/20 284 lb 9.6 oz (129.1 kg)   He reports trying to lose weight at the  suggestion of his cardiologist this year.  General: Not acutely ill-appearing, no muscle wasting Eyes: sclera anicteric, no redness ENT: oral mucosa moist without lesions, no cervical or supraclavicular lymphadenopathy CV: RRR without murmur, S1/S2, no JVD, + peripheral edema Resp: clear to auscultation bilaterally, normal RR and effort noted GI: soft, obese, no tenderness, with active bowel sounds. No guarding or palpable organomegaly noted. Skin; warm and dry, no rash or jaundice noted Neuro: awake, alert and oriented x 3. Normal gross motor function and fluent speech  Labs:  CMP Latest Ref Rng & Units 04/14/2021 09/09/2020 11/13/2019  Glucose 70 - 99 mg/dL 107(H) 102(H) 120(H)  BUN 8 - 27 mg/dL 21 17 17   Creatinine 0.76 - 1.27 mg/dL 0.99 1.22 1.25  Sodium 134 - 144 mmol/L 143 141 145(H)  Potassium 3.5 - 5.2 mmol/L 5.0 5.3(H) 5.0  Chloride 96 - 106 mmol/L 104 102 107(H)  CO2 20 - 29 mmol/L 27 24 25   Calcium 8.6 - 10.2 mg/dL 9.4 9.2 9.4  Total Protein 6.0 - 8.5 g/dL - - -  Total Bilirubin 0.0 - 1.2 mg/dL - - -  Alkaline Phos 48 - 121 IU/L - - -  AST 0 - 40 IU/L - - -  ALT 0 - 44 IU/L - - -   Last CBC in our was in 2021 (primary care labs are outside of epic)  Assessment: Encounter Diagnoses  Name Primary?   Nausea in adult Yes   Special  screening for malignant neoplasms, colon     Average risk colorectal cancer screening.  Increased risk for sedation related complications given his dilated cardiomyopathy and untreated sleep apnea.  I recommended a Cologuard test, discussed the need for colonoscopy if positive, and he and his wife were agreeable  Upper digestive symptoms difficult to characterize.  Plan: H. pylori serum antibody  Upper GI series (no small bowel follow-through) to rule out stricture or evidence of gastric outlet obstruction.  Thank you for the courtesy of this consult.  Please call me with any questions or concerns.  Nelida Meuse III  CC: Referring provider noted above

## 2021-04-18 NOTE — Patient Instructions (Addendum)
If you are age 72 or older, your body mass index should be between 23-30. Your Body mass index is 37.3 kg/m. If this is out of the aforementioned range listed, please consider follow up with your Primary Care Provider.  If you are age 38 or younger, your body mass index should be between 19-25. Your Body mass index is 37.3 kg/m. If this is out of the aformentioned range listed, please consider follow up with your Primary Care Provider.   ________________________________________________________  The [facility] providers would like to encourage you to use [facility] to communicate with providers for non-urgent requests or questions.  Due to long hold times on the telephone, sending your provider a message by [facility] may be a faster and more efficient way to get a response.  Please allow 48 business hours for a response.  Please remember that this is for non-urgent requests.  _______________________________________________________  Your provider has ordered Cologuard testing as an option for colon cancer screening. This is performed by [employer] and may be out of network with your insurance. PRIOR to completing the test, it is YOUR responsibility to contact your insurance about covered benefits for this test. Your out of pocket expense could be anywhere from $0.00 to $649.00.   When you call to check coverage with your insurer, please provide the following information:   -The ONLY provider of Cologuard is [place] code for Cologuard is [phone].  [employer] NPI # [id number]  -[employer] Tax ID # [id number]   We have already sent your demographic and insurance information to [employer] (phone number [phone]) and they should contact you within the next week regarding your test. If you have not heard from them within the next week, please call our office at [phone].  Your provider has requested that you go to the basement  level for lab work before leaving today. Press "B" on the elevator. The lab is located at the first door on the left as you exit the elevator.  Due to recent changes in healthcare laws, you may see the results of your imaging and laboratory studies on MyChart before your provider has had a chance to review them.  We understand that in some cases there may be results that are confusing or concerning to you. Not all laboratory results come back in the same time frame and the provider may be waiting for multiple results in order to interpret others.  Please give [place] 48 hours in order for your provider to thoroughly review all the results before contacting the office for clarification of your results.   You have been scheduled for an Upper GI Series at [facility]. Your appointment is on 05-03-2021 at 10:30am. Please arrive 15 minutes prior to your test for registration. Make sure not to eat or drink anything after midnight on the night before your test. If you need to reschedule, please call radiology at [phone]. ________________________________________________________________ An upper GI series uses x rays to help diagnose problems of the upper GI tract, which includes the esophagus, stomach, and duodenum. The duodenum is the first part of the small intestine. An upper GI series is conducted by a radiology technologist or a radiologist--a doctor who specializes in x-ray imaging--at a hospital or outpatient center. While sitting or standing in front of an x-ray machine, the patient drinks barium liquid, which is often white and has a chalky consistency and taste. The barium liquid coats the lining of the  upper GI tract and makes signs of disease show up more clearly on x rays. X-ray video, called fluoroscopy, is used to view the barium liquid moving through the esophagus, stomach, and duodenum. Additional x rays and fluoroscopy are performed while the patient lies on an x-ray table. To fully coat  the upper GI tract with barium liquid, the technologist or radiologist may press on the abdomen or ask the patient to change position. Patients hold still in various positions, allowing the technologist or radiologist to take x rays of the upper GI tract at different angles. If a technologist conducts the upper GI series, a radiologist will later examine the images to look for problems.  This test typically takes about 1 hour to complete. __________________________________________________________________   It was a pleasure to see you today!  Thank you for trusting me with your gastrointestinal care!

## 2021-04-29 LAB — COLOGUARD: COLOGUARD: NEGATIVE

## 2021-05-03 ENCOUNTER — Other Ambulatory Visit: Payer: Self-pay

## 2021-05-03 ENCOUNTER — Other Ambulatory Visit: Payer: Self-pay | Admitting: Gastroenterology

## 2021-05-03 ENCOUNTER — Ambulatory Visit (HOSPITAL_COMMUNITY)
Admission: RE | Admit: 2021-05-03 | Discharge: 2021-05-03 | Disposition: A | Discharge status: Home / Self Care | Payer: Medicare HMO | Source: Ambulatory Visit | Attending: Gastroenterology | Admitting: Gastroenterology

## 2021-05-03 DIAGNOSIS — Z1211 Encounter for screening for malignant neoplasm of colon: Secondary | ICD-10-CM

## 2021-05-03 DIAGNOSIS — R110 Nausea: Secondary | ICD-10-CM | POA: Diagnosis present

## 2021-05-12 ENCOUNTER — Other Ambulatory Visit: Payer: Self-pay

## 2021-05-12 ENCOUNTER — Ambulatory Visit (INDEPENDENT_AMBULATORY_CARE_PROVIDER_SITE_OTHER): Payer: Medicare HMO | Admitting: Psychiatry

## 2021-05-12 DIAGNOSIS — G4721 Circadian rhythm sleep disorder, delayed sleep phase type: Secondary | ICD-10-CM

## 2021-05-12 DIAGNOSIS — F3130 Bipolar disorder, current episode depressed, mild or moderate severity, unspecified: Secondary | ICD-10-CM | POA: Diagnosis not present

## 2021-05-12 DIAGNOSIS — Z62820 Parent-biological child conflict: Secondary | ICD-10-CM | POA: Diagnosis not present

## 2021-05-12 DIAGNOSIS — G3184 Mild cognitive impairment, so stated: Secondary | ICD-10-CM | POA: Diagnosis not present

## 2021-05-12 DIAGNOSIS — F1021 Alcohol dependence, in remission: Secondary | ICD-10-CM

## 2021-05-12 NOTE — Progress Notes (Signed)
Psychotherapy Progress Note [facility] [name], PhD LP  Patient ID: Calvin Escobar)    MRN: [id number] Therapy format: Family therapy w/ patient -- accompanied by wife, [name] Date: 05/12/2021      Start: 11:14a     Stop: 12:02p     Time Spent: 48 min Location: In-person   Session narrative (presenting needs, interim history, self-report of stressors and symptoms, applications of prior therapy, status changes, and interventions made in session) Wife able to make it today, has agenda to resolve stress at home, goes into how he gets addicted to news, gets worried, her way of "calming" him is to tell him he shouldn't watch that stuff and do something else.  Notes she was initially offended by the note sent home, which objectively said nothing pushy or judgmental.  Processed in session by assuring her of intentions and asking what made it seem that way, clarified strictly stating it seemed like we had an agreement she had made but was reversing and hoped of better understand what was at stake and what we could do for all concerned.  Rapport improved, and by the end of the session told that she felt very good about the experience and working relationship.  One reason Velta Addison has booked this appointment is that she has started finding pills on the floor, a sign of missing attention and maybe higher level of need for Avari's care.  Probed for tensions between them as Eshawn has suggested, she denies pushing him but recognizes on reflection how some things sound.  Accepted suggestions for tone, tempo, and number of pieces of information as appropriate to depression and cognitive difficulties, and suggestions for constructive wording and clarification.  Felipa Evener out and contextualized the chronic complaint that he wishes he was back in [name] hometown, back working, Social research officer, government., which PT declines, aware that it can't happen.    Notes they have slept separately for a number of years due  to his "leaky syndrome".  Wears briefs, which embarrasses him.  Validated.    Re. [name], sounds like he picks up on lines he overhears from [facility] [place] and in his own tension -- which seems to be rather autistic-like, or LD somehow -- tends to be sharper with [name].  [name] agreed to help watch tone, primarily her own, and to coach [name] to not take things personally and to leave himself out of the business of instructing his father what to do.  Encouraged if three are any activities they used to enjoy or might again, it could be worth trying to find them again for some degree of positive time out from under the emotional burdens of reacting or trying to manage each other.  Therapeutic modalities: Cognitive Behavioral Therapy, Solution-Oriented/Positive Psychology, and Assertiveness/Communication  Mental Status/Observations:  Appearance:   Casual     Behavior:  Appropriate  Motor:  Normal  Speech/Language:   within normal limits  Affect:  Appropriate  Mood:  anxious and responsive  Thought process:  concrete  Thought content:    WNL  Sensory/Perceptual disturbances:    WNL  Orientation:  Fully oriented  Attention:  Good    Concentration:  Fair  Memory:  grossly intact  Insight:    Fair  Judgment:   Good  Impulse Control:  Good   Risk Assessment: Danger to Self: No Self-injurious Behavior: No Danger to Others: No Physical Aggression / Violence: No Duty to Warn: No Access to Firearms a concern: No  Assessment of progress:  progressing  Diagnosis:   ICD-10-CM   1. Bipolar I disorder, most recent episode depressed ([place])  F31.30     2. Relationship problem between parent and child  Z62.820     3. Circadian rhythm sleep disorder, delayed sleep phase type - controlled  G47.21     4. Mild cognitive impairment by history  G31.84     5. Alcohol use disorder, moderate, in sustained remission ([place])  F10.21      Plan:  [name] will try to put things more constructively, matter of  factly, and one point at a time [name] will encourage [name] to not take things personally and not try to teach his father Agreed Dmari needs activities, but it can be his prerogative to watch news, just get some physical and mental variety into his day Other recommendations/advice as may be noted above Continue to utilize previously learned skills ad lib Maintain medication as prescribed and work faithfully with relevant prescriber(s) if any changes are desired or seem indicated Call the clinic on-call service, 988/hotline, 911, or present to [facility] or ER if any life-threatening psychiatric crisis Return for time as available. Already scheduled visit in this office Visit date not found.  [name], PhD [name], PhD LP Clinical Psychologist, [facility] [address], [place], [place] [address] [phone]

## 2021-06-29 ENCOUNTER — Other Ambulatory Visit: Payer: Self-pay

## 2021-06-29 ENCOUNTER — Ambulatory Visit: Payer: Medicare HMO | Admitting: Psychiatry

## 2021-06-29 DIAGNOSIS — G3184 Mild cognitive impairment, so stated: Secondary | ICD-10-CM

## 2021-06-29 DIAGNOSIS — Z62820 Parent-biological child conflict: Secondary | ICD-10-CM

## 2021-06-29 DIAGNOSIS — F1021 Alcohol dependence, in remission: Secondary | ICD-10-CM

## 2021-06-29 DIAGNOSIS — G4721 Circadian rhythm sleep disorder, delayed sleep phase type: Secondary | ICD-10-CM

## 2021-06-29 DIAGNOSIS — F3130 Bipolar disorder, current episode depressed, mild or moderate severity, unspecified: Secondary | ICD-10-CM

## 2021-06-29 NOTE — Patient Instructions (Addendum)
Today's idea from therapy: Motto: "I can do a little something today" On bad days, still attempt 10 steps out the door, or 1 trip up and down stairs -- just getting a little something started helps break up depression Good idea to follow through with the Friday classes at Dr. Karen Chafe office Optional, but encouraged -- give [name] (not just [facility]) feedback about how they've changed what they offer Sounds like it's going well to look at less war coverage.  Give yourself a pat on the back for recognizing what it was doing to you!  And by all means, enjoy the music, sports, and other things you're taking in. If there is any activity you and Velta Addison would like to do together, make sure you both make time for it.  Sharing quality time is a good antidepressant, too. For worries (e.g., [name]'s battery wearing out), better to check facts (e.g., ask her how long they said she has on her battery when they do intend to replace it).  Guaranteed they don't mean to wait to 2% left -- more like 20%.

## 2021-06-29 NOTE — Progress Notes (Signed)
Psychotherapy Progress Note [facility] Calvin Moore, PhD LP  Patient ID: Calvin Escobar Calvin Escobar)    MRN: [id number] Therapy format: Individual psychotherapy Date: 06/29/2021      Start: 3:14p     Stop: 3:59p     Time Spent: 45 min Location: In-person   Session narrative (presenting needs, interim history, self-report of stressors and symptoms, applications of prior therapy, status changes, and interventions made in session) Here alone today.  Better energy off the bat.  C/o cold weather inhibiting him from getting exercise, and says Calvin Escobar yells at him for not getting out more, but then says he did get a run of outdoors time 3 weeks ago, he means to get back out.  And then that he is getting 20 minutes many days, actually.  Not fond of the gym, which is Calvin Escobar preference, but will walk.  Interested in a "wisdom" class tomorrow at his psychiatrist's [facility], to be weekly on Fridays.  Calvin Escobar unfortunately stopped the Saturday morning classes he liked, and the rest of what they offer is really geared for evenings, and computer-based.    Still believes wife wants him to be as active as she is (not possible), and that she wants him to go back to work, but challenged him to trust otherwise, especially for hearing last time that she may retire before long, depending on how her pacemaker procedure works out.    Insurance allegedly balking now at paying or services, but nothing has come up on our end.  Challenged to check facts, let us know if anything actually pertains to services here and to contact other providers if it is them.  Re. his alleged Calvin Escobar about the war in [place], he has effectively toned down how much he looks at it.  Realized finally that he can't control it, just hope for the best.  Spending more time with his music and sports now, which feels better.  Says his tablet news channel has also toned down how much it gives him war news, for some reason.  Likes the  weather.  Informed that his tablet will respond to what he asks for by providing more of that, so actually he has trained it to feed less war news by getting himself to seek it less.  Worked out further motivational and anti-depression measures, noted below.  Therapeutic modalities: Cognitive Behavioral Therapy and Solution-Oriented/Positive Psychology  Mental Status/Observations:  Appearance:   Casual     Behavior:  Appropriate  Motor:  Normal  Speech/Language:   Clear and Coherent  Affect:  Appropriate  Mood:  dysthymic and substantially better  Thought process:  concrete  Thought content:    WNL  Sensory/Perceptual disturbances:    WNL  Orientation:  Fully oriented  Attention:  Good    Concentration:  Good  Memory:  grossly intact  Insight:    Fair  Judgment:   Good  Impulse Control:  Good   Risk Assessment: Danger to Self: No Self-injurious Behavior: No Danger to Others: No Physical Aggression / Violence: No Duty to Warn: No Access to Firearms a concern: No  Assessment of progress:  progressing  Diagnosis:   ICD-10-CM   1. Bipolar I disorder, most recent episode depressed (Calvin Escobar) - improved  F31.30     2. Mild cognitive impairment by history  G31.84     3. Circadian rhythm sleep disorder, delayed sleep phase type - controlled  G47.21     4. Alcohol use disorder, moderate, in sustained remission (Calvin Escobar)  F10.21     5. Relationship problem between parent and child  Z62.820      Plan:  Much of the following provided in print at departure Motto: "I can do a little something today" On bad days, still attempt 10 steps out the door, or 1 trip up and down stairs -- just getting a little something started helps break up depression Good idea to follow through with the Friday classes at Calvin Escobar Optional, but encouraged -- give Calvin Escobar (not just Calvin Escobar) feedback about how they've changed what they offer Sounds like it's going well to look at less war coverage.  Give  yourself a pat on the back for recognizing what it was doing to you!  And by all means, enjoy the music, sports, and other things you're taking in. If there is any activity you and Calvin Escobar would like to do together, make sure you both make time for it.  Sharing quality time is a good antidepressant, too. For worries (e.g., Calvin Escobar's battery wearing out), better to check facts (e.g., ask her how long they said she has on her battery when they do intend to replace it).  Guaranteed they don't mean to wait to 2% left -- more like 20%. Calvin Escobar remains welcome in therapy for coordination and troubleshooting communication and conflict Other recommendations/advice as may be noted above Continue to utilize previously learned skills ad lib Maintain medication as prescribed and work faithfully with relevant prescriber(s) if any changes are desired or seem indicated Call the clinic on-call service, 988/hotline, 911, or present to Calvin Escobar or ER if any life-threatening psychiatric crisis Return for session(s) already scheduled. Already scheduled visit in this Esc[place] 08/09/2021.  [name], PhD Calvin Moore, PhD LP Clinical Psychologist, [facility] [address], [place], [place] [address] [phone]

## 2021-07-14 ENCOUNTER — Ambulatory Visit: Payer: Medicare HMO | Admitting: Psychiatry

## 2021-08-09 ENCOUNTER — Ambulatory Visit: Payer: Medicare HMO | Admitting: Psychiatry

## 2021-08-09 ENCOUNTER — Other Ambulatory Visit: Payer: Self-pay

## 2021-08-09 DIAGNOSIS — F3130 Bipolar disorder, current episode depressed, mild or moderate severity, unspecified: Secondary | ICD-10-CM

## 2021-08-09 DIAGNOSIS — Z62820 Parent-biological child conflict: Secondary | ICD-10-CM | POA: Diagnosis not present

## 2021-08-09 DIAGNOSIS — F1021 Alcohol dependence, in remission: Secondary | ICD-10-CM | POA: Diagnosis not present

## 2021-08-09 DIAGNOSIS — G3184 Mild cognitive impairment, so stated: Secondary | ICD-10-CM

## 2021-08-09 NOTE — Progress Notes (Signed)
Psychotherapy Progress Note ?[facility] ?[name], PhD LP ? ?Patient ID: Calvin Escobar [facility])    MRN: [id number] ?Therapy format: Individual psychotherapy ?Date: 08/09/2021      Start: 2:10p     Stop: 2:55p     Time Spent: 47 min ?Location: In-person  ? ?Session narrative (presenting needs, interim history, self-report of stressors and symptoms, applications of prior therapy, status changes, and interventions made in session) ?Little down and out lately. Feels shut in too much, still don't get along with son.  Says he complains he "doesn't act like a father" (doesn't specify, and Khylon doesn't ask what he wants instead), and [name] will tell him he shouldn't be collecting Social Security but working (very unrealistic).  Confirms [name] has been special ed of some kind, which fits the impression gained these months. ? ?Has come off one of his pills, he says, which costs more.  Annual physical soon.  Has a home health service through insurance coming over in 3 weeks.  Walking more often, getting some distance in.  Regular Fridays doing the Wisdom group at [facility] with a therapist there.  Glad to have that, glad to have a peer group.  Looking into finding a peers' walking group.  Affirmed and encouraged.  Discussed using his [employer] pass, checking out [employer]   ? ?No longer using BiPAP ("that didn't work") but is getting up in the morning, doing some wash, contributing to the household. ? ?Reaffirmed wife Velta Addison welcome to consult, as we seemed to have good success, at least briefly, meeting together.  She is understood to be very unavailable, because she is very dedicated to her work at [employer] office, despite stressful clientele and her heart condition (needs pacemaker battery replacement).  C/o her watching TV and having her own life in evenings, and supposedly telling him he should get a job or something.  Probed whether she means income or just something else to  occupy him away from home and [place].  After protesting his age and health, agreed she probably does mean it for his own good and or lowering tensions with [name].  Encouraged he perception-check if he has any inkling it's the (unrealistic) former.   ? ?Therapeutic modalities: Cognitive Behavioral Therapy, Solution-Oriented/Positive Psychology, and Ego-Supportive ? ?Mental Status/Observations: ? ?Appearance:   Casual     ?Behavior:  Appropriate  ?Motor:  Normal and except stiff gait  ?Speech/Language:   Clear and Coherent and except mild articulation  ?Affect:  Appropriate  ?Mood:  dysthymic, responsive  ?Thought process:  concrete  ?Thought content:    Some rumination, less  ?Sensory/Perceptual disturbances:    WNL  ?Orientation:  Fully oriented  ?Attention:  Good  ?  ?Concentration:  Good  ?Memory:  Some limitation in word finding  ?Insight:    Fair  ?Judgment:   Good  ?Impulse Control:  Fair  ? ?Risk Assessment: ?Danger to Self: No Self-injurious Behavior: No ?Danger to Others: No Physical Aggression / Violence: No ?Duty to Warn: No Access to Firearms a concern: No ? ?Assessment of progress:  progressing ? ?Diagnosis: ?  ICD-10-CM   ?1. Bipolar I disorder, most recent episode depressed ([place])  F31.30   ?  ?2. Relationship problem between parent and child  Z62.820   ?  ?3. Alcohol use disorder, moderate, in sustained remission ([place])  F10.21   ?  ?4. Mild cognitive impairment by history  G31.84   ?  ? ?Plan:  ?Continue group involvements  ?  Continue walking, seek peers in walking as able, e.g., through [facility].  Contacts given for [employer] and [employer]. ?Responses to conflict: ?[name], when she suggests getting a job, perception-check: is it a job or something to be busy that you want? ?[name], when he complains -- what is it you want instead? (free to consider, don't have to argue) ?Wife still welcome to join sessions ?Other recommendations/advice as may be noted above ?Continue to utilize previously  learned skills ad lib ?Maintain medication as prescribed and work faithfully with relevant prescriber(s) if any changes are desired or seem indicated ?Call the clinic on-call service, 988/hotline, 911, or present to [facility] or ER if any life-threatening psychiatric crisis ?Return in about 1 month (around 09/09/2021) for time as available. ?Already scheduled visit in this office Visit date not found. ? ?[name], PhD ?[name], PhD LP ?Clinical Psychologist, [facility] ?[facility] ?[address], [address] ?[place], [place] [address] ?(o) [phone] ?

## 2021-08-09 NOTE — Patient Instructions (Signed)
Senior Resources (groups, maybe exercise/walking) -- [phone] ? ?[place] -- have a senior center with indoor walking ? ?[facility] -- fitness program of [employer], with tailor a program, offer pool, walking, gym, very pleasant environment.  Located at [employer], [place]. ?

## 2021-09-20 ENCOUNTER — Ambulatory Visit: Payer: Medicare PPO | Admitting: Psychiatry

## 2021-09-20 DIAGNOSIS — F1021 Alcohol dependence, in remission: Secondary | ICD-10-CM

## 2021-09-20 DIAGNOSIS — Z62820 Parent-biological child conflict: Secondary | ICD-10-CM

## 2021-09-20 DIAGNOSIS — G3184 Mild cognitive impairment, so stated: Secondary | ICD-10-CM | POA: Diagnosis not present

## 2021-09-20 DIAGNOSIS — Z91199 Patient's noncompliance with other medical treatment and regimen due to unspecified reason: Secondary | ICD-10-CM

## 2021-09-20 DIAGNOSIS — F3130 Bipolar disorder, current episode depressed, mild or moderate severity, unspecified: Secondary | ICD-10-CM | POA: Diagnosis not present

## 2021-09-20 NOTE — Progress Notes (Signed)
Psychotherapy Progress Note [facility] [name], PhD LP  Patient ID: Calvin Escobar [facility])    MRN: [id number] Therapy format: Individual psychotherapy Date: 09/20/2021      Start: 3:17p     Stop: 4:05p     Time Spent: 48 min Location: In-person   Session narrative (presenting needs, interim history, self-report of stressors and symptoms, applications of prior therapy, status changes, and interventions made in session) Continue to c/o tension with son Calvin Escobar, says any day he can make critical comments.  Says Calvin Escobar indulges him, makes his lunches for him, etc.  Can still feel like he doesn't have freedom to be about the house, but tried to encourage him to occupy his own space anyway, use the living room rather than resent.  Says he went outside and [place] locked the door behind him.  Hard to know the full truth of it, as things have been disputed before, but Discussed priorities for Calvin Escobar's behavior and urged to be willing to ask wife for backup asking him to adjust his own behavior, since he is much closer to her and seems to be on the spectrum.Marland Kitchen    Apparently hx of another son, Calvin Escobar, presumably neurotypical, who died a decade ago, of alcoholism.  Not often, but sometimes Calvin Escobar fears having his license taken.  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  Couldn't abide CPAP.  Relying only on meds and early bed to get enough rest.  Educated about alternatives to CPAP.  Wisdom group at [facility] folded.  Still feels [employer] have eroded, and it's just not the place it was when it was [facility].  Wants to get out more, at least.  Delayed with the cold snap, but would like walking, science center.  Affirmed and encouraged.  Calvin Escobar finally got her new pacemaker battery this week, after putting up with dizzy spells and finding out she was under 15% left.  She is on a defib, actually, and working on bringing  down some tachycardia lately.    Therapeutic modalities: Cognitive Behavioral Therapy, Solution-Oriented/Positive Psychology, and Ego-Supportive  Mental Status/Observations:  Appearance:   Casual     Behavior:  Appropriate  Motor:  Normal  Speech/Language:   Clear and Coherent  Affect:  Appropriate  Mood:  dysthymic  Thought process:  concrete  Thought content:    Rumination  Sensory/Perceptual disturbances:    WNL  Orientation:  Fully oriented  Attention:  Good    Concentration:  Good  Memory:  WNL  Insight:    Fair  Judgment:   Good  Impulse Control:  Good   Risk Assessment: Danger to Self: No Self-injurious Behavior: No Danger to Others: No Physical Aggression / Violence: No Duty to Warn: No Access to Firearms a concern: No  Assessment of progress:  stabilized  Diagnosis:   ICD-10-CM   1. Bipolar I disorder, most recent episode depressed ([place])  F31.30     2. Relationship problem between parent and child  Z62.820     3. Mild cognitive impairment by history  G31.84     4. Alcohol use disorder, moderate, in sustained remission ([facility])  F10.21     5. Poor compliance with CPAP treatment  Z91.199      Plan:  For [employer] criticisms, denying help, or other negativity, ask him what he wants instead of complaining or swallowing conflict. Walk outside as able Look into  other treatment for sleep apnea, consult physician [facility] to use tactics discussed earlier in dealing with Calvin Escobar's complaints and chronic conflict with Calvin Escobar Other recommendations/advice as may be noted above Continue to utilize previously learned skills ad lib Maintain medication as prescribed and work faithfully with relevant prescriber(s) if any changes are desired or seem indicated Call the clinic on-call service, 988/hotline, 911, or present to [facility] or ER if any life-threatening psychiatric crisis Return in about 1 month (around 10/20/2021). Already scheduled visit in this office Visit  date not found.  [name], PhD [name], PhD LP Clinical Psychologist, [facility] [address], [place], [place] [address] [phone]

## 2021-09-20 NOTE — Patient Instructions (Signed)
Options for walking and finding active friends: ? ?Senior Resources (groups, maybe exercise/walking) -- [phone] ?  ?[place] -- have a senior center with indoor walking ?  ?[facility] -- fitness program of [employer], with tailor a program, offer pool, walking, gym, very pleasant environment.  Located at [employer], [place]. ?

## 2021-11-13 ENCOUNTER — Ambulatory Visit: Payer: Medicare PPO | Admitting: Psychiatry

## 2021-12-25 ENCOUNTER — Ambulatory Visit: Payer: Medicare PPO | Admitting: Psychiatry

## 2022-03-09 DIAGNOSIS — F321 Major depressive disorder, single episode, moderate: Secondary | ICD-10-CM | POA: Diagnosis not present

## 2022-03-14 ENCOUNTER — Encounter: Payer: Self-pay | Admitting: Internal Medicine

## 2022-03-14 ENCOUNTER — Ambulatory Visit (INDEPENDENT_AMBULATORY_CARE_PROVIDER_SITE_OTHER): Payer: Medicare PPO | Admitting: Internal Medicine

## 2022-03-14 VITALS — BP 105/74 | HR 85 | Temp 97.9°F | Ht 72.0 in | Wt 280.2 lb

## 2022-03-14 DIAGNOSIS — N3943 Post-void dribbling: Secondary | ICD-10-CM | POA: Diagnosis not present

## 2022-03-14 DIAGNOSIS — Z85828 Personal history of other malignant neoplasm of skin: Secondary | ICD-10-CM | POA: Diagnosis not present

## 2022-03-14 DIAGNOSIS — J42 Unspecified chronic bronchitis: Secondary | ICD-10-CM

## 2022-03-14 DIAGNOSIS — F1021 Alcohol dependence, in remission: Secondary | ICD-10-CM | POA: Diagnosis not present

## 2022-03-14 DIAGNOSIS — I5022 Chronic systolic (congestive) heart failure: Secondary | ICD-10-CM | POA: Diagnosis not present

## 2022-03-14 DIAGNOSIS — F419 Anxiety disorder, unspecified: Secondary | ICD-10-CM | POA: Diagnosis not present

## 2022-03-14 DIAGNOSIS — F3131 Bipolar disorder, current episode depressed, mild: Secondary | ICD-10-CM

## 2022-03-14 HISTORY — DX: Post-void dribbling: N39.43

## 2022-03-14 MED ORDER — TAMSULOSIN HCL 0.4 MG PO CAPS: 0.4000 mg | ORAL_CAPSULE | Freq: Every day | ORAL | 3 refills | Status: DC

## 2022-03-14 MED ORDER — LOSARTAN POTASSIUM 50 MG PO TABS: 50.0000 mg | ORAL_TABLET | Freq: Every day | ORAL | 3 refills | Status: DC

## 2022-03-14 NOTE — Assessment & Plan Note (Signed)
He is going to have to stop Delene Loll because it is costing $500 a month therefore I am going to put in losartan for now and I want him to continue his carvedilol and pravastatin with no change.  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  continue  Outpatient medications     carvedilol (COREG) 12.5 MG tablet 12.5 mg, 2 times daily       pravastatin (PRAVACHOL) 20 MG tablet 20 mg, Daily at bedtime      Continue alcohol abstinence

## 2022-03-14 NOTE — Assessment & Plan Note (Addendum)
Refer to urology- suspect bph.  Trial flomax.

## 2022-03-14 NOTE — Assessment & Plan Note (Signed)
>>  ASSESSMENT AND PLAN FOR URINARY DRIBBLING WRITTEN ON 03/14/2022  1:49 PM BY [name], MD  Refer to urology- suspect bph.  Trial flomax.
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
## 2022-03-14 NOTE — Assessment & Plan Note (Addendum)
He follows with [facility] He follows with support group He is stable at present, but is tearful over world events    I have asked him to start walking daily.  While watching the news and did a brief intervention with encouraging him to be compassionate to himself and recognize the tremendous progress he is made in recovery and self-improvement and offered to work with him closely as he likes and encouraged him to see his behavioral health and psychiatrist as soon as he can get in I offered to take over all primary Care med prescribing but all of his psych meds including Ativan and insomnia med for sleep which is hypnotic are managed by psychiatry
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
## 2022-03-14 NOTE — Assessment & Plan Note (Signed)
Still in remission Encouraged continue suport group attendance.

## 2022-03-23 ENCOUNTER — Ambulatory Visit: Payer: Self-pay | Admitting: *Deleted

## 2022-03-23 DIAGNOSIS — F321 Major depressive disorder, single episode, moderate: Secondary | ICD-10-CM | POA: Diagnosis not present

## 2022-03-23 NOTE — Telephone Encounter (Signed)
  Pt's wife called to discuss psychiatric care in the area due to not living here for an extremely long time. Reviewed practices, [facility], [facility], [facility]. Reason for Disposition  [1] Other NON-URGENT information for PCP AND [2] does not require PCP response  Answer Assessment - Initial Assessment Questions 1. REASON FOR CALL or QUESTION: "What is your reason for calling today?" or "How can I best help you?" or "What question do you have that I can help answer?"     Psychiatric Resources  Protocols used: Information Only Call - No Triage-A-AH, PCP Call - No Triage-A-AH

## 2022-04-15 ENCOUNTER — Other Ambulatory Visit: Payer: Self-pay | Admitting: Cardiology

## 2022-04-19 ENCOUNTER — Telehealth: Payer: Self-pay | Admitting: Internal Medicine

## 2022-04-19 NOTE — Telephone Encounter (Signed)
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 Brooks Sailors to adjust his behavioral health regimen  - He is currently not happy with current behavioral health provider   Patient requests: - A new referral to [facility] behavioral health hospital at [address], [place], [place] [address]

## 2022-04-20 ENCOUNTER — Other Ambulatory Visit: Payer: Self-pay

## 2022-04-20 DIAGNOSIS — F3131 Bipolar disorder, current episode depressed, mild: Secondary | ICD-10-CM

## 2022-04-20 NOTE — Telephone Encounter (Signed)
Placed the referral for this facility per patient request.

## 2022-04-25 DIAGNOSIS — X32XXXD Exposure to sunlight, subsequent encounter: Secondary | ICD-10-CM | POA: Diagnosis not present

## 2022-04-25 DIAGNOSIS — C44519 Basal cell carcinoma of skin of other part of trunk: Secondary | ICD-10-CM | POA: Diagnosis not present

## 2022-04-25 DIAGNOSIS — L570 Actinic keratosis: Secondary | ICD-10-CM | POA: Diagnosis not present

## 2022-05-04 ENCOUNTER — Ambulatory Visit (INDEPENDENT_AMBULATORY_CARE_PROVIDER_SITE_OTHER): Payer: Medicare PPO

## 2022-05-04 VITALS — BP 122/78 | HR 76 | Temp 98.5°F | Wt 282.8 lb

## 2022-05-04 DIAGNOSIS — Z0000 Encounter for general adult medical examination without abnormal findings: Secondary | ICD-10-CM

## 2022-05-04 NOTE — Patient Instructions (Signed)
[name] , Thank you for taking time to come for your Medicare Wellness Visit. I appreciate your ongoing commitment to your health goals. Please review the following plan we discussed and let me know if I can assist you in the future.   These are the goals we discussed:  Goals   None     This is a list of the screening recommended for you and due dates:  Health Maintenance  Topic Date Due   COVID-19 Vaccine (7 - 2023-24 season) 04/18/2022   Hepatitis C Screening: USPSTF Recommendation to screen - Ages 18-79 yo.  03/15/2023*   Medicare Annual Wellness Visit  05/05/2023   DTaP/Tdap/Td vaccine (2 - Td or Tdap) 10/12/2024   Colon Cancer Screening  04/24/2031   Pneumonia Vaccine  Completed   Flu Shot  Completed   Zoster (Shingles) Vaccine  Completed   HPV Vaccine  Aged Out  *Topic was postponed. The date shown is not the original due date.    Advanced directives: Please bring a copy of your health care power of attorney and living will to the office at your convenience.  Conditions/risks identified: Get healthier and not be so anxious   Next appointment: Follow up in one year for your annual wellness visit.   Preventive Care 73 Years and Older, Male  Preventive care refers to lifestyle choices and visits with your health care provider that can promote health and wellness. What does preventive care include? A yearly physical exam. This is also called an annual well check. Dental exams once or twice a year. Routine eye exams. Ask your health care provider how often you should have your eyes checked. Personal lifestyle choices, including: Daily care of your teeth and gums. Regular physical activity. Eating a healthy diet. Avoiding tobacco and drug use. Limiting alcohol use. Practicing safe sex. Taking low doses of aspirin every day. Taking vitamin and mineral supplements as recommended by your health care provider. What happens during an annual well check? The services and  screenings done by your health care provider during your annual well check will depend on your age, overall health, lifestyle risk factors, and family history of disease. Counseling  Your health care provider may ask you questions about your: Alcohol use. Tobacco use. Drug use. Emotional well-being. Home and relationship well-being. Sexual activity. Eating habits. History of falls. Memory and ability to understand (cognition). Work and work Statistician. Screening  You may have the following tests or measurements: Height, weight, and BMI. Blood pressure. Lipid and cholesterol levels. These may be checked every 5 years, or more frequently if you are over 75 years old. Skin check. Lung cancer screening. You may have this screening every year starting at age 73 if you have a 30-pack-year history of smoking and currently smoke or have quit within the past 15 years. Fecal occult blood test (FOBT) of the stool. You may have this test every year starting at age 60. Flexible sigmoidoscopy or colonoscopy. You may have a sigmoidoscopy every 5 years or a colonoscopy every 10 years starting at age 73. Prostate cancer screening. Recommendations will vary depending on your family history and other risks. Hepatitis C blood test. Hepatitis B blood test. Sexually transmitted disease (STD) testing. Diabetes screening. This is done by checking your blood sugar (glucose) after you have not eaten for a while (fasting). You may have this done every 1-3 years. Abdominal aortic aneurysm (AAA) screening. You may need this if you are a current or former smoker. Osteoporosis. You  may be screened starting at age 73 if you are at high risk. Talk with your health care provider about your test results, treatment options, and if necessary, the need for more tests. Vaccines  Your health care provider may recommend certain vaccines, such as: Influenza vaccine. This is recommended every year. Tetanus, diphtheria, and  acellular pertussis (Tdap, Td) vaccine. You may need a Td booster every 10 years. Zoster vaccine. You may need this after age 73. Pneumococcal 13-valent conjugate (PCV13) vaccine. One dose is recommended after age 25. Pneumococcal polysaccharide (PPSV23) vaccine. One dose is recommended after age 73. Talk to your health care provider about which screenings and vaccines you need and how often you need them. This information is not intended to replace advice given to you by your health care provider. Make sure you discuss any questions you have with your health care provider. Document Released: 06/17/2015 Document Revised: 02/08/2016 Document Reviewed: 03/22/2015 Elsevier Interactive Patient Education  2017 [place] Prevention in the Home Falls can cause injuries. They can happen to people of all ages. There are many things you can do to make your home safe and to help prevent falls. What can I do on the outside of my home? Regularly fix the edges of walkways and driveways and fix any cracks. Remove anything that might make you trip as you walk through a door, such as a raised step or threshold. Trim any bushes or trees on the path to your home. Use bright outdoor lighting. Clear any walking paths of anything that might make someone trip, such as rocks or tools. Regularly check to see if handrails are loose or broken. Make sure that both sides of any steps have handrails. Any raised decks and porches should have guardrails on the edges. Have any leaves, snow, or ice cleared regularly. Use sand or salt on walking paths during winter. Clean up any spills in your garage right away. This includes oil or grease spills. What can I do in the bathroom? Use night lights. Install grab bars by the toilet and in the tub and shower. Do not use towel bars as grab bars. Use non-skid mats or decals in the tub or shower. If you need to sit down in the shower, use a plastic, non-slip stool. Keep  the floor dry. Clean up any water that spills on the floor as soon as it happens. Remove soap buildup in the tub or shower regularly. Attach bath mats securely with double-sided non-slip rug tape. Do not have throw rugs and other things on the floor that can make you trip. What can I do in the bedroom? Use night lights. Make sure that you have a light by your bed that is easy to reach. Do not use any sheets or blankets that are too big for your bed. They should not hang down onto the floor. Have a firm chair that has side arms. You can use this for support while you get dressed. Do not have throw rugs and other things on the floor that can make you trip. What can I do in the kitchen? Clean up any spills right away. Avoid walking on wet floors. Keep items that you use a lot in easy-to-reach places. If you need to reach something above you, use a strong step stool that has a grab bar. Keep electrical cords out of the way. Do not use floor polish or wax that makes floors slippery. If you must use wax, use non-skid floor wax. Do  not have throw rugs and other things on the floor that can make you trip. What can I do with my stairs? Do not leave any items on the stairs. Make sure that there are handrails on both sides of the stairs and use them. Fix handrails that are broken or loose. Make sure that handrails are as long as the stairways. Check any carpeting to make sure that it is firmly attached to the stairs. Fix any carpet that is loose or worn. Avoid having throw rugs at the top or bottom of the stairs. If you do have throw rugs, attach them to the floor with carpet tape. Make sure that you have a light switch at the top of the stairs and the bottom of the stairs. If you do not have them, ask someone to add them for you. What else can I do to help prevent falls? Wear shoes that: Do not have high heels. Have rubber bottoms. Are comfortable and fit you well. Are closed at the toe. Do not  wear sandals. If you use a stepladder: Make sure that it is fully opened. Do not climb a closed stepladder. Make sure that both sides of the stepladder are locked into place. Ask someone to hold it for you, if possible. Clearly mark and make sure that you can see: Any grab bars or handrails. First and last steps. Where the edge of each step is. Use tools that help you move around (mobility aids) if they are needed. These include: Canes. Walkers. Scooters. Crutches. Turn on the lights when you go into a dark area. Replace any light bulbs as soon as they burn out. Set up your furniture so you have a clear path. Avoid moving your furniture around. If any of your floors are uneven, fix them. If there are any pets around you, be aware of where they are. Review your medicines with your doctor. Some medicines can make you feel dizzy. This can increase your chance of falling. Ask your doctor what other things that you can do to help prevent falls. This information is not intended to replace advice given to you by your health care provider. Make sure you discuss any questions you have with your health care provider. Document Released: 03/17/2009 Document Revised: 10/27/2015 Document Reviewed: 06/25/2014 Elsevier Interactive Patient Education  2017 [employer].

## 2022-05-04 NOTE — Progress Notes (Signed)
I connected with  Calvin Escobar on 05/04/22 by a audio enabled telemedicine application and verified that I am speaking with the correct person using two identifiers.  Patient Location: Home  Provider Location: Office/Clinic  I discussed the limitations of evaluation and management by telemedicine. The patient expressed understanding and agreed to proceed.  Subjective:   Calvin Escobar is a 71 y.o. male who presents for an Initial Medicare Annual Wellness Visit.  Review of Systems     Cardiac Risk Factors include: advanced age (>45mn, >>15women);hypertension;male gender;obesity (BMI >30kg/m2);sedentary lifestyle     Objective:    Today's Vitals   05/04/22 1327  BP: 122/78  Pulse: 76  Temp: 98.5 F (36.9 C)  SpO2: 93%  Weight: 282 lb 12.8 oz (128.3 kg)   Body mass index is 38.35 kg/m.     05/04/2022    1:38 PM 07/03/2019    8:47 AM 01/29/2016    2:00 PM 01/29/2016   10:38 AM  Advanced Directives  Does Patient Have a Medical Advance Directive? Yes No No No  Type of AParamedicof AToolLiving will     Copy of HNelsonin Chart? No - copy requested     Would patient like information on creating a medical advance directive?  No - Patient declined No - patient declined information No - patient declined information    Current Medications (verified) Outpatient Encounter Medications as of 05/04/2022  Medication Sig   calcium carbonate (OSCAL) 1500 (600 Ca) MG TABS tablet Take 600 mg of elemental calcium by mouth daily.   carvedilol (COREG) 12.5 MG tablet Take 1 tablet (12.5 mg total) by mouth 2 (two) times daily. Please make an appt.with Cardiologist in order to receive future refills. Thank You. 1st Attempt.   LORazepam (ATIVAN) 1 MG tablet 1 tablet by mouth every morning and 2 tablets by mouth at bedtime   losartan (COZAAR) 50 MG tablet Take 1 tablet (50 mg total) by mouth daily. Replaces entresto, start after done with entresto    Multiple Vitamins-Minerals (MULTIVITAMIN WITH MINERALS) tablet Take 1 tablet by mouth daily.   pravastatin (PRAVACHOL) 20 MG tablet Take 20 mg by mouth at bedtime.    tamsulosin (FLOMAX) 0.4 MG CAPS capsule Take 1 capsule (0.4 mg total) by mouth daily.   traZODone (DESYREL) 100 MG tablet Take 200 mg by mouth at bedtime.   venlafaxine XR (EFFEXOR-XR) 75 MG 24 hr capsule    zolpidem (AMBIEN) 10 MG tablet Take 10 mg by mouth at bedtime.   No facility-administered encounter medications on file as of 05/04/2022.    Allergies (verified) Patient has no known allergies.   History: Past Medical History:  Diagnosis Date   Allergy seasonal   Ascending aorta dilatation (HCC)    38 mm by 2D echo 04/2021   [place]   DCM (dilated cardiomyopathy) ([place]    nonischemic with normal coronary arteries at cath 06/2019.  EF 35 to 40% on echo 04/2021   Dementia ([facility]    Depression    Hyperlipidemia    OSA treated with BiPAP    Urinary dribbling 03/14/2022   Past Surgical History:  Procedure Laterality Date   COLONOSCOPY     RIGHT/LEFT HEART CATH AND CORONARY ANGIOGRAPHY N/A 07/03/2019   Procedure: RIGHT/LEFT HEART CATH AND CORONARY ANGIOGRAPHY;  Surgeon: BJolaine Artist MD;  [facility];  Service: Cardiovascular;  Laterality: N/A;   Family History  Problem Relation Age of Onset  Hypertension Mother    Cancer Father    Bone cancer Sister    Social History   Socioeconomic History   Marital status: Married    Spouse name: Not on file   Number of children: Not on file   Years of education: Not on file   Highest education level: Not on file  Occupational History   Not on file  Tobacco Use   Smoking status: Never   Smokeless tobacco: Never  Substance and Sexual Activity   Alcohol use: Yes    Alcohol/week: 14.0 standard drinks of alcohol    Types: 14 Cans of beer per week   Drug use: No   Sexual activity: Not on file  Other Topics Concern   Not on file   Social History Narrative   Not on file   Social Determinants of Health   Financial Resource Strain: Low Risk  (05/04/2022)   Overall Financial Resource Strain (CARDIA)    Difficulty of Paying Living Expenses: Not hard at all  Food Insecurity: No Food Insecurity (05/04/2022)   Hunger Vital Sign    Worried About Running Out of Food in the Last Year: Never true    Ran Out of Food in the Last Year: Never true  Transportation Needs: No Transportation Needs (05/04/2022)   PRAPARE - Hydrologist (Medical): No    Lack of Transportation (Non-Medical): No  Physical Activity: Inactive (05/04/2022)   Exercise Vital Sign    Days of Exercise per Week: 0 days    Minutes of Exercise per Session: 0 min  Stress: No Stress Concern Present (05/04/2022)   [place]    Feeling of Stress : Only a little  Social Connections: Moderately Isolated (05/04/2022)   Social Connection and Isolation Panel [NHANES]    Frequency of Communication with Friends and Family: Once a week    Frequency of Social Gatherings with Friends and Family: Once a week    Attends Religious Services: Never    Marine scientist or Organizations: Yes    Attends Music therapist: 1 to 4 times per year    Marital Status: Married    Tobacco Counseling Counseling given: Not Answered   Clinical Intake:  Pre-visit preparation completed: Yes  Pain : No/denies pain     BMI - recorded: 38.35 Nutritional Status: BMI > 30  Obese Nutritional Risks: None Diabetes: No  How often do you need to have someone help you when you read instructions, pamphlets, or other written materials from your doctor or pharmacy?: 1 - Never  Diabetic?no  Interpreter Needed?: No  Information entered by :: Charlott Rakes, LPN   Activities of Daily Living    05/04/2022    1:40 PM  In your present state of health, do you have any difficulty  performing the following activities:  Hearing? 0  Vision? 0  Difficulty concentrating or making decisions? 0  Walking or climbing stairs? 1  Comment related to legs give out  Dressing or bathing? 0  Doing errands, shopping? 0  Preparing Food and eating ? N  Using the Toilet? N  In the past six months, have you accidently leaked urine? Y  Comment at times  Do you have problems with loss of bowel control? N  Managing your Medications? N  Managing your Finances? N  Housekeeping or managing your Housekeeping? N    Patient Care Team: Loralee Pacas, MD as PCP -  General (Internal Medicine) Sueanne Margarita, MD as PCP - Cardiology (Cardiology) Allyn Kenner, MD (Dermatology) [facility] [address])  Indicate any recent Medical Services you may have received from other than [facility] providers in the past year (date may be approximate).     Assessment:   This is a routine wellness examination for Calvin Escobar.  Hearing/Vision screen Hearing Screening - Comments:: Pt denies any hearing issues  Vision Screening - Comments:: Pt follows up with [facility] for annual eye exams   Dietary issues and exercise activities discussed: Current Exercise Habits: The patient does not participate in regular exercise at present   Goals Addressed             This Visit's Progress    Patient Stated       Patient Stated       Get healthier and not be so anxious        Depression Screen    05/04/2022    1:34 PM 03/14/2022    1:01 PM  PHQ 2/9 Scores  PHQ - 2 Score 4 6  PHQ- 9 Score 14 18    Fall Risk    05/04/2022    1:40 PM 05/04/2022    1:38 PM 03/14/2022    1:00 PM  [place] in the past year?  1 1  Number falls in past yr:  1 1  Injury with Fall?  0 0  Risk for fall due to : Impaired vision;History of fall(s)  History of fall(s)  Follow up Falls prevention discussed  Falls evaluation completed    FALL RISK PREVENTION  PERTAINING TO THE HOME:  Any stairs in or around the home? Yes  If so, are there any without handrails? No  Home free of loose throw rugs in walkways, pet beds, electrical cords, etc? Yes  Adequate lighting in your home to reduce risk of falls? Yes   ASSISTIVE DEVICES UTILIZED TO PREVENT FALLS:  Life alert? No  Use of a cane, walker or w/c? No  Grab bars in the bathroom? No  Shower chair or bench in shower? No  Elevated toilet seat or a handicapped toilet? No   TIMED UP AND GO:  Was the test performed? Yes .  Length of time to ambulate 10 feet: 20 sec.   Gait steady and fast without use of assistive device  Cognitive Function:        05/04/2022    1:44 PM  6CIT Screen  What Year? 0 points  What month? 0 points  What time? 0 points  Count back from 20 0 points  Months in reverse 0 points  Repeat phrase 8 points  Total Score 8 points    Immunizations Immunization History  Administered Date(s) Administered   Fluad Quad(high Dose 65+) 03/12/2016, 02/07/2018, 03/29/2021, 02/14/2022   PFIZER Comirnaty(Gray Top)Covid-19 Tri-Sucrose Vaccine 09/03/2020   PFIZER(Purple Top)SARS-COV-2 Vaccination 07/04/2019, 07/25/2019, 02/28/2020   Pfizer Covid-19 Vaccine Bivalent Booster 40yr & up 03/29/2021, 02/21/2022   Pneumococcal Conjugate-13 10/13/2014   Pneumococcal Polysaccharide-23 11/28/2015   Tdap 10/13/2014   Zoster Recombinat (Shingrix) 05/18/2019, 05/17/2020   Zoster, Live 01/15/2019    TDAP status: Up to date  Flu Vaccine status: Up to date  Pneumococcal vaccine status: Up to date  Covid-19 vaccine status: Completed vaccines  Qualifies for Shingles Vaccine? Yes   Zostavax completed Yes   Shingrix Completed?: Yes  Screening Tests Health Maintenance  Topic Date Due  COVID-19 Vaccine (7 - 2023-24 season) 04/18/2022   Hepatitis C Screening  03/15/2023 (Originally 11/22/1968)   Medicare Annual Wellness (AWV)  05/05/2023   DTaP/Tdap/Td (2 - Td or Tdap) 10/12/2024    COLONOSCOPY (Pts 45-55yr Insurance coverage will need to be confirmed)  04/24/2031   Pneumonia Vaccine 71 Years old  Completed   INFLUENZA VACCINE  Completed   Zoster Vaccines- Shingrix  Completed   HPV VACCINES  Aged Out    Health Maintenance  Health Maintenance Due  Topic Date Due   COVID-19 Vaccine (7 - 2023-24 season) 04/18/2022    Colorectal cancer screening: Type of screening: Colonoscopy. Completed 04/23/21. Repeat every 10 years  Additional Screening:  Hepatitis C Screening: does qualify;  Vision Screening: Recommended annual ophthalmology exams for early detection of glaucoma and other disorders of the eye. Is the patient up to date with their annual eye exam?  Yes  Who is the provider or what is the name of the office in which the patient attends annual eye exams? [facility]  If pt is not established with a provider, would they like to be referred to a provider to establish care? No .   Dental Screening: Recommended annual dental exams for proper oral hygiene  Community Resource Referral / Chronic Care Management: CRR required this visit?  No   CCM required this visit?  No      Plan:     I have personally reviewed and noted the following in the patient's chart:   Medical and social history Use of alcohol, tobacco or illicit drugs  Current medications and supplements including opioid prescriptions. Patient is not currently taking opioid prescriptions. Functional ability and status Nutritional status Physical activity Advanced directives List of other physicians Hospitalizations, surgeries, and ER visits in previous 12 months Vitals Screenings to include cognitive, depression, and falls Referrals and appointments  In addition, I have reviewed and discussed with patient certain preventive protocols, quality metrics, and best practice recommendations. A written personalized care plan for preventive services as well as general preventive health  recommendations were provided to patient.     TWillette Brace LPN   190/08/90  Nurse Notes: none

## 2022-05-10 ENCOUNTER — Other Ambulatory Visit: Payer: Self-pay | Admitting: Cardiology

## 2022-05-10 NOTE — Telephone Encounter (Signed)
Rx refill sent to pharmacy. 

## 2022-05-14 ENCOUNTER — Other Ambulatory Visit: Payer: Self-pay | Admitting: Internal Medicine

## 2022-05-14 ENCOUNTER — Other Ambulatory Visit: Payer: Self-pay | Admitting: Cardiology

## 2022-05-14 ENCOUNTER — Encounter: Payer: Self-pay | Admitting: Internal Medicine

## 2022-05-14 ENCOUNTER — Ambulatory Visit (INDEPENDENT_AMBULATORY_CARE_PROVIDER_SITE_OTHER): Payer: Medicare PPO | Admitting: Internal Medicine

## 2022-05-14 VITALS — BP 118/69 | HR 78 | Temp 97.9°F | Resp 12 | Ht 72.0 in | Wt 282.2 lb

## 2022-05-14 DIAGNOSIS — N401 Enlarged prostate with lower urinary tract symptoms: Secondary | ICD-10-CM | POA: Diagnosis not present

## 2022-05-14 DIAGNOSIS — I420 Dilated cardiomyopathy: Secondary | ICD-10-CM

## 2022-05-14 DIAGNOSIS — M1990 Unspecified osteoarthritis, unspecified site: Secondary | ICD-10-CM | POA: Diagnosis not present

## 2022-05-14 DIAGNOSIS — F3131 Bipolar disorder, current episode depressed, mild: Secondary | ICD-10-CM | POA: Diagnosis not present

## 2022-05-14 DIAGNOSIS — I5022 Chronic systolic (congestive) heart failure: Secondary | ICD-10-CM

## 2022-05-14 DIAGNOSIS — N138 Other obstructive and reflux uropathy: Secondary | ICD-10-CM | POA: Diagnosis not present

## 2022-05-14 DIAGNOSIS — G4733 Obstructive sleep apnea (adult) (pediatric): Secondary | ICD-10-CM | POA: Diagnosis not present

## 2022-05-14 DIAGNOSIS — N3943 Post-void dribbling: Secondary | ICD-10-CM | POA: Diagnosis not present

## 2022-05-14 MED ORDER — OZEMPIC (0.25 OR 0.5 MG/DOSE) 2 MG/3ML ~~LOC~~ SOPN: PEN_INJECTOR | SUBCUTANEOUS | 2 refills | Status: AC

## 2022-05-14 MED ORDER — TAMSULOSIN HCL 0.4 MG PO CAPS: 0.4000 mg | ORAL_CAPSULE | Freq: Two times a day (BID) | ORAL | 3 refills | Status: DC

## 2022-05-14 MED ORDER — ONE-A-DAY MENS PO TABS: 1.0000 | ORAL_TABLET | Freq: Every day | ORAL | 3 refills | Status: DC

## 2022-05-14 NOTE — Assessment & Plan Note (Addendum)
Stable, improving Continue Flomax but try increasing dose Urology referral pending

## 2022-05-14 NOTE — Assessment & Plan Note (Addendum)
Encouraged following with Dr. Radford Pax and exercise No fluid on legs and breathing normally today Encouraged him again to take b vitamins- suspect this was due to alcohol use disorder and is improving now that its in remission Encouraged to stay off alcohol for this reason Offered physical therapy referral - he plans more exercise on own Encouraged following with Dr. Radford Pax and exercise Encouraged weight loss Can't afford entresto so continue                   '[]'$   Name Dose, Frequency Patient Sig Refills                 Cardiovascular Agents     '[]'$   carvedilol (COREG) 12.5 MG tablet 12.5 mg, 2 times daily Take 1 tablet (12.5 mg total) by mouth 2 (two) times daily. Please make an appt.with Cardiologist in order to receive future refills. Thank You. 2nd Attempt. 0 ordered      Summary:  Take 1 tablet (12.5 mg total) by mouth 2 (two) times daily. Please make an appt.with Cardiologist in order to receive future refills. Thank You. 2nd Attempt., Starting Thu 05/10/2022, Normal          '[]'$   losartan (COZAAR) 50 MG tablet 50 mg, Daily Take 1 tablet (50 mg total) by mouth daily. Replaces entresto, start after done with entresto 3 ordered      Summary:  Take 1 tablet (50 mg total) by mouth daily. Replaces entresto, start after done with entresto, Starting Wed 03/14/2022, Normal          '[]'$   pravastatin (PRAVACHOL) 20 MG tablet 20 mg, Daily at bedtime Take 20 mg by mouth at bedtime.       Summary:  Take 20 mg by mouth at bedtime. , Starting Fri 04/17/2019, Historical Med        And I ordered multivitamin to help support recovery from nonischemic cardiomyopathy

## 2022-05-14 NOTE — Assessment & Plan Note (Addendum)
>>  ASSESSMENT AND PLAN FOR BENIGN PROSTATIC HYPERPLASIA WITH URINARY OBSTRUCTION WRITTEN ON 05/14/2022  1:39 PM BY Chelcey Caputo, Johnella Moloney, MD  Stable, chronic, severe Continue Flomax but try increasing dose Urology referral pending  >>ASSESSMENT AND PLAN FOR URINARY DRIBBLING WRITTEN ON 05/14/2022  1:38 PM BY [name], MD  Stable, improving Continue Flomax but try increasing dose Urology referral pending

## 2022-05-14 NOTE — Assessment & Plan Note (Addendum)
No fluid on legs and breathing normally today Encouraged him again to take b vitamins- suspect this was due to alcohol use disorder and is improving now that its in remission Encouraged to stay off alcohol for this reason Offered physical therapy referral - he plans more exercise on own Encouraged following with Dr. Radford Pax and exercise

## 2022-05-14 NOTE — Assessment & Plan Note (Addendum)
Patient reports this is stable now with [name] group support We placed 1 month(s) ago a a new referral to [facility] behavioral health hospital at [address], [place], [place] [address]  but it didn't move forward so he would like to try a separate referral  He plans to see Dr. Milagros Reap again for psychiatry so will defer there.

## 2022-05-14 NOTE — Assessment & Plan Note (Addendum)
Dr. Milagros Reap gave sleeping medication I advised he reconsider sleep machine (but "I can't handle the hoses" and work on weight loss. Gave info about the value of treating osa

## 2022-05-14 NOTE — Patient Instructions (Addendum)
It was a pleasure seeing you today!  Your health and satisfaction are my top priorities. If you believe your experience today was worthy of a 5-star rating, I'd be grateful for your feedback! Calvin Pacas, MD   CHECKOUT CHECKLIST  '[]'$    Schedule next appointment(s):    Return in about 2 months (around 07/15/2022) for review problems and medications.  Any requested lab visits should be scheduled as appointments too  If you are not doing well:  Return to the office sooner Please bring all your medicine bottles to each appointment If your condition begins to worsen or become severe:  go to the emergency room or even call 911  '[]'$    Sign release of information authorizations: Any records we need for your care and to be your medical home  REMINDERS:  '[]'$    I'm trying to get Ozempic to help with weight loss.  Wish you could wear the bipap, but if you can't we need to lose weight and consider dental appliance from dentist.  We should call about referral to behavioral health at [facility].   '[]'$   (Optional):  Review your clinical notes on MyChart after they are completed.     Today's draft of the physician documented plan for today's visit: (final revisions will be visible on MyChart chart later) Urinary dribbling Assessment & Plan: Stable, improving Continue Flomax but try increasing dose Urology referral pending  Orders: -     Tamsulosin HCl; Take 1 capsule (0.4 mg total) by mouth in the morning and at bedtime.  Dispense: 180 capsule; Refill: 3  Benign prostatic hyperplasia with urinary obstruction Assessment & Plan: Stable, chronic, severe Continue Flomax but try increasing dose Urology referral pending  Orders: -     Tamsulosin HCl; Take 1 capsule (0.4 mg total) by mouth in the morning and at bedtime.  Dispense: 180 capsule; Refill: 3  Bipolar affective disorder, currently depressed, mild ([place]) Assessment & Plan: Patient reports this is stable now with [name] group  support We placed 1 month(s) ago a a new referral to [facility] behavioral health hospital at [address], [place], [place] [address]  but it didn't move forward so he would like to try a separate referral  He plans to see Calvin Escobar again for psychiatry so will defer there.     Orders: -     Ambulatory referral to Psychology  DCM (dilated cardiomyopathy) ([place]) Assessment & Plan: No fluid on legs and breathing normally today Encouraged him again to take b vitamins- suspect this was due to alcohol use disorder and is improving now that its in remission Encouraged to stay off alcohol for this reason Offered physical therapy referral - he plans more exercise on own Encouraged following with Dr. Radford Pax and exercise  Orders: -     One-A-Day Mens; Take 1 tablet by mouth daily.  Dispense: 100 tablet; Refill: 3 -     Ozempic (0.25 or 0.5 MG/DOSE); Inject 0.25 mg into the skin once a week for 28 days, THEN 0.5 mg once a week for 28 days.  Dispense: 3 mL; Refill: 2  Chronic systolic heart failure (HCC) Assessment & Plan: Encouraged following with Dr. Radford Pax and exercise No fluid on legs and breathing normally today Encouraged him again to take b vitamins- suspect this was due to alcohol use disorder and is improving now that its in remission Encouraged to stay off alcohol for this reason Offered physical therapy referral - he plans more exercise on  own Encouraged following with Dr. Radford Pax and exercise Encouraged weight loss Can't afford entresto so continue                   '[]'$   Name Dose, Frequency Patient Sig Refills                 Cardiovascular Agents     '[]'$   carvedilol (COREG) 12.5 MG tablet 12.5 mg, 2 times daily Take 1 tablet (12.5 mg total) by mouth 2 (two) times daily. Please make an appt.with Cardiologist in order to receive future refills. Thank You. 2nd Attempt. 0 ordered      Summary:  Take 1 tablet (12.5 mg total) by mouth 2 (two) times daily. Please make an appt.with  Cardiologist in order to receive future refills. Thank You. 2nd Attempt., Starting Thu 05/10/2022, Normal          '[]'$   losartan (COZAAR) 50 MG tablet 50 mg, Daily Take 1 tablet (50 mg total) by mouth daily. Replaces entresto, start after done with entresto 3 ordered      Summary:  Take 1 tablet (50 mg total) by mouth daily. Replaces entresto, start after done with entresto, Starting Wed 03/14/2022, Normal          '[]'$   pravastatin (PRAVACHOL) 20 MG tablet 20 mg, Daily at bedtime Take 20 mg by mouth at bedtime.       Summary:  Take 20 mg by mouth at bedtime. , Starting Fri 04/17/2019, Historical Med        And I ordered multivitamin to help support recovery from nonischemic cardiomyopathy    Orders: -     One-A-Day Mens; Take 1 tablet by mouth daily.  Dispense: 100 tablet; Refill: 3 -     Ozempic (0.25 or 0.5 MG/DOSE); Inject 0.25 mg into the skin once a week for 28 days, THEN 0.5 mg once a week for 28 days.  Dispense: 3 mL; Refill: 2  OSA treated with BiPAP Assessment & Plan: Calvin Escobar gave sleeping medication I advised he reconsider sleep machine (but "I can't handle the hoses" and work on weight loss. Gave info about the value of treating osa    Morbid obesity ([place])      QUESTIONS & CONCERNS: CLINICAL: please contact us via phone [phone] OR MyChart messaging  LAB & IMAGING:   We will call you if the results are significantly abnormal or you don't use MyChart.  Most normal results will be posted to MyChart immediately and have a clinical review message by Dr. Randol Kern posted within 2-3 business days.   If you have not heard from [place] regarding the results in 2 weeks OR if you need priority reporting, please contact this office. MYCHART:  The fastest way to get your results and easiest way to stay in touch with [place] is by activating your My Chart account. Instructions are located on the last page of this paperwork.  BILLING: xray and lab orders are billed from  separate companies and questions./concerns should be directed to the [facility].  For visit charges please discuss with our administrative services COMPLAINTS:  please let Dr. Randol Kern know or see the [facility], by asking at the front desk: we want you to be satisfied with every experience and we would be grateful for the opportunity to address any problems

## 2022-05-14 NOTE — Progress Notes (Signed)
[name]: [phone]   Routine Medical Office Visit  Patient:  Calvin Escobar      Age: 71 y.o.       Sex:  male  Date:   05/14/2022  PCP:    Loralee Pacas, [place] Provider: Loralee Pacas, MD  Assessment/Plan:   Calvin Escobar was seen today for 2 month follow-up and requests new psychiatrist.  Generalized arthritis -     Ambulatory referral to Physical Therapy  Urinary dribbling Overview: Requires diapers and special underwear that has been getting less wet with flomax Causes odor.    Assessment & Plan: Stable, improving Continue Flomax but try increasing dose Urology referral pending  Orders: -     Tamsulosin HCl; Take 1 capsule (0.4 mg total) by mouth in the morning and at bedtime.  Dispense: 180 capsule; Refill: 3  Benign prostatic hyperplasia with urinary obstruction Overview: Dribbling/nocturia wears diapers or special underwear It gets less wet as long as he stays on Flomax/tamsulosin  Assessment & Plan: Stable, chronic, severe Continue Flomax but try increasing dose Urology referral pending  Orders: -     Tamsulosin HCl; Take 1 capsule (0.4 mg total) by mouth in the morning and at bedtime.  Dispense: 180 capsule; Refill: 3  Bipolar affective disorder, currently depressed, mild (HCC) Overview: History of lithium toxicity and self reports the diagnosis but this is unconfirmed  Currently not on mood stabilizer  Noted history of bipolar 1 in chart. Following with [place] groups/classes Used to following with [facility] with Dr. Milagros Reap but felt it wasn't for him.   Assessment & Plan: Patient reports this is stable now with [name] group support We placed 1 month(s) ago a a new referral to [facility] hospital at [address] [place], [place] [address]  but it didn't move forward so he would like to try a separate referral  He plans to see Dr. Milagros Reap again for psychiatry so will defer  there.     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 Radford Pax   Assessment & Plan: No fluid on legs and breathing normally today Encouraged him again to take b vitamins- suspect this was due to alcohol use disorder and is improving now that its in remission Encouraged to stay off alcohol for this reason Offered physical therapy referral - he plans more exercise on own Encouraged following with Dr. Radford Pax and exercise  Orders: -     One-A-Day Mens; Take 1 tablet by mouth daily.  Dispense: 100 tablet; Refill: 3 -     Ozempic (0.25 or 0.5 MG/DOSE); Inject 0.25 mg into the skin once a week for 28 days, THEN 0.5 mg once a week for 28 days.  Dispense: 3 mL; Refill: 2  Chronic systolic heart failure (HCC) Overview: EF 25-30% jan 2021   Assessment & Plan: Encouraged following with Dr. Radford Pax and exercise No fluid on legs and breathing normally today Encouraged him again to take b vitamins- suspect this was due to alcohol use disorder and is improving now that its in remission Encouraged to stay off alcohol for this reason Offered physical therapy referral - he plans more exercise on own Encouraged following with Dr. Radford Pax and exercise Encouraged weight loss Can't afford entresto so continue                   '[]'$   Name Dose, Frequency Patient Sig Refills                 Cardiovascular Agents     '[]'$   carvedilol (COREG) 12.5 MG tablet 12.5 mg, 2 times daily Take 1 tablet (12.5 mg total) by mouth 2 (two) times daily. Please make an appt.with Cardiologist in order to receive future refills. Thank You. 2nd Attempt. 0 ordered      Summary:  Take 1 tablet (12.5 mg total) by mouth 2 (two) times daily. Please make an appt.with Cardiologist in order to receive future refills. Thank You. 2nd Attempt., Starting Thu 05/10/2022,  Normal          '[]'$   losartan (COZAAR) 50 MG tablet 50 mg, Daily Take 1 tablet (50 mg total) by mouth daily. Replaces entresto, start after done with entresto 3 ordered      Summary:  Take 1 tablet (50 mg total) by mouth daily. Replaces entresto, start after done with entresto, Starting Wed 03/14/2022, Normal          '[]'$   pravastatin (PRAVACHOL) 20 MG tablet 20 mg, Daily at bedtime Take 20 mg by mouth at bedtime.       Summary:  Take 20 mg by mouth at bedtime. , Starting Fri 04/17/2019, Historical Med        And I ordered multivitamin to help support recovery from nonischemic cardiomyopathy    Orders: -     One-A-Day Mens; Take 1 tablet by mouth daily.  Dispense: 100 tablet; Refill: 3 -     Ozempic (0.25 or 0.5 MG/DOSE); Inject 0.25 mg into the skin once a week for 28 days, THEN 0.5 mg once a week for 28 days.  Dispense: 3 mL; Refill: 2 -     Ambulatory referral to Physical Therapy  OSA treated with BiPAP Overview: Couldn't tolerate the bipap   Assessment & Plan: Dr. Milagros Reap gave sleeping medication I advised he reconsider sleep machine (but "I can't handle the hoses" and work on weight loss. Gave info about the value of treating osa    Morbid obesity ([place]) -     Ambulatory referral to Physical Therapy     Return in about 2 months (around 07/15/2022) for review problems and medications.   Today's key discussion points and After Visit Summary (AVS) reminders.  He was encouraged to contact our office by phone or message via MyChart if he has any questions or concerns regarding our treatment plan (see AVS). He was given an opportunity to ask questions/clarifications about any aspect of the diagnosis and treatment plan at today's visit. Common side effects, risks, benefits, and alternatives for medications and treatment plan prescribed today were discussed. He expressed understanding of the given instructions.  We discussed red flag symptoms and signs in detail and when  to call the office or go to ER if his condition worsens (see AVS). He expressed understanding and AVS is used to reinforce. Medication list was reconciled and patient instructions and summary information was documented and made available for him to review in the AVS (see AVS).  This entire medical encounter document is also available on MyChart for him to review for accuracy and understanding.  Review of this document is encouraged in the AVS. No barriers to understanding were identified     Subjective:   Nekoda Chock is a 71 y.o. male with the following chart data reviewed: Past Medical History:  Diagnosis Date   Allergy seasonal   Ascending aorta dilatation ([place])  38 mm by 2D echo 04/2021   Cancer ([place]) skin   DCM (dilated cardiomyopathy) ([place])    nonischemic with normal coronary arteries at cath 06/2019.  EF 35 to 40% on echo 04/2021   Dementia ([place])    Depression    Hyperlipidemia    OSA treated with BiPAP    Urinary dribbling 03/14/2022   Outpatient Medications Prior to Visit  Medication Sig   calcium carbonate (OSCAL) 1500 (600 Ca) MG TABS tablet Take 600 mg of elemental calcium by mouth daily.   carvedilol (COREG) 12.5 MG tablet Take 1 tablet (12.5 mg total) by mouth 2 (two) times daily. Please make an appt.with Cardiologist in order to receive future refills. Thank You. 2nd Attempt.   LORazepam (ATIVAN) 1 MG tablet 1 tablet by mouth every morning and 2 tablets by mouth at bedtime   losartan (COZAAR) 50 MG tablet Take 1 tablet (50 mg total) by mouth daily. Replaces entresto, start after done with entresto   Multiple Vitamins-Minerals (MULTIVITAMIN WITH MINERALS) tablet Take 1 tablet by mouth daily.   pravastatin (PRAVACHOL) 20 MG tablet Take 20 mg by mouth at bedtime.    traZODone (DESYREL) 100 MG tablet Take 200 mg by mouth at bedtime.   venlafaxine XR (EFFEXOR-XR) 75 MG 24 hr capsule    zolpidem (AMBIEN) 10 MG tablet Take 10 mg by mouth at bedtime.   [DISCONTINUED]  tamsulosin (FLOMAX) 0.4 MG CAPS capsule Take 1 capsule (0.4 mg total) by mouth daily.   No facility-administered medications prior to visit.     He presented today reporting reason for visit as: Chief Complaint  Patient presents with   2 month follow-up    Everything has been about the same, urinary issue has gotten better.   Requests new psychiatrist    Doesn't feel that he is being treated and taken care of properly with current one.     Problem focused charting was used to record today's medical interview as follows: Problem  Urinary Dribbling   Requires diapers and special underwear that has been getting less wet with flomax Causes odor.     Chronic Systolic Heart Failure (Hcc)   EF 25-30% jan 2021    Dcm (Dilated Cardiomyopathy) (Hcc)   nonischemic with normal coronary arteries at cath 05/2019 Ef 25% 2021-> 40% 04/2021 Following with cardiology Dr. Blythe Stanford Treated With Bipap   Couldn't tolerate the bipap    Benign Prostatic Hyperplasia With Urinary Obstruction   Dribbling/nocturia wears diapers or special underwear It gets less wet as long as he stays on Flomax/tamsulosin   Bipolar Affective Disorder, Depressed (Hcc)   History of lithium toxicity and self reports the diagnosis but this is unconfirmed  Currently not on mood stabilizer  Noted history of bipolar 1 in chart. Following with [place] groups/classes Used to following with [facility] with Dr. Milagros Reap but felt it wasn't for him.              Objective:  BP 118/69 (BP Location: Left Arm, Patient Position: Sitting)   Pulse 78   Temp 97.9 F (36.6 C) (Temporal)   Resp 12   Ht 6' (1.829 m)   Wt 282 lb 3.2 oz (128 kg)   SpO2 95%   BMI 38.27 kg/m  Physical Exam  Problem-specific:  less anxious than last visit, very stiff standing up has to use hands to rise from seat, morbid obesity, very nice guy.    Loralee Pacas, MD

## 2022-05-15 DIAGNOSIS — F1010 Alcohol abuse, uncomplicated: Secondary | ICD-10-CM | POA: Diagnosis not present

## 2022-05-15 DIAGNOSIS — F321 Major depressive disorder, single episode, moderate: Secondary | ICD-10-CM | POA: Diagnosis not present

## 2022-05-16 ENCOUNTER — Ambulatory Visit: Payer: Medicare PPO | Admitting: Internal Medicine

## 2022-05-18 ENCOUNTER — Telehealth: Payer: Self-pay | Admitting: Internal Medicine

## 2022-05-18 NOTE — Telephone Encounter (Signed)
Patient states he would like to put Physical Therapy on hold for now.   Patient states he will call later to schedule PT appointment if he feels he needs it.

## 2022-05-21 NOTE — Progress Notes (Signed)
Subjective:   Calvin Escobar is a 71 y.o. male who presents for an Initial Medicare Annual Wellness Visit.  Review of Systems     Cardiac Risk Factors include: advanced age (>37mn, >>91women);hypertension;male gender;obesity (BMI >30kg/m2);sedentary lifestyle     Objective:    Today's Vitals   05/04/22 1327  BP: 122/78  Pulse: 76  Temp: 98.5 F (36.9 C)  SpO2: 93%  Weight: 282 lb 12.8 oz (128.3 kg)   Body mass index is 38.35 kg/m.     05/04/2022    1:38 PM 07/03/2019    8:47 AM 01/29/2016    2:00 PM 01/29/2016   10:38 AM  Advanced Directives  Does Patient Have a Medical Advance Directive? Yes No No No  Type of AParamedicof AErinLiving will     Copy of [place] Chart? No - copy requested     Would patient like information on creating a medical advance directive?  No - Patient declined No - patient declined information No - patient declined information    Current Medications (verified) Outpatient Encounter Medications as of 05/04/2022  Medication Sig   calcium carbonate (OSCAL) 1500 (600 Ca) MG TABS tablet Take 600 mg of elemental calcium by mouth daily.   LORazepam (ATIVAN) 1 MG tablet 1 tablet by mouth every morning and 2 tablets by mouth at bedtime   losartan (COZAAR) 50 MG tablet Take 1 tablet (50 mg total) by mouth daily. Replaces entresto, start after done with entresto   Multiple Vitamins-Minerals (MULTIVITAMIN WITH MINERALS) tablet Take 1 tablet by mouth daily.   pravastatin (PRAVACHOL) 20 MG tablet Take 20 mg by mouth at bedtime.    traZODone (DESYREL) 100 MG tablet Take 200 mg by mouth at bedtime.   venlafaxine XR (EFFEXOR-XR) 75 MG 24 hr capsule    zolpidem (AMBIEN) 10 MG tablet Take 10 mg by mouth at bedtime.   [DISCONTINUED] carvedilol (COREG) 12.5 MG tablet Take 1 tablet (12.5 mg total) by mouth 2 (two) times daily. Please make an appt.with Cardiologist in order to receive future refills. Thank You. 1st  Attempt.   [DISCONTINUED] tamsulosin (FLOMAX) 0.4 MG CAPS capsule Take 1 capsule (0.4 mg total) by mouth daily.   No facility-administered encounter medications on file as of 05/04/2022.    Allergies (verified) Patient has no known allergies.   History: Past Medical History:  Diagnosis Date   Allergy seasonal   Ascending aorta dilatation (HCC)    38 mm by 2D echo 04/2021   Cancer ([place] skin   DCM (dilated cardiomyopathy) ([place]    nonischemic with normal coronary arteries at cath 06/2019.  EF 35 to 40% on echo 04/2021   Dementia ([place]    Depression    Hyperlipidemia    OSA treated with BiPAP    Urinary dribbling 03/14/2022   Past Surgical History:  Procedure Laterality Date   COLONOSCOPY     RIGHT/LEFT HEART CATH AND CORONARY ANGIOGRAPHY N/A 07/03/2019   Procedure: RIGHT/LEFT HEART CATH AND CORONARY ANGIOGRAPHY;  Surgeon: BJolaine Artist MD;  [facility];  Service: Cardiovascular;  Laterality: N/A;   Family History  Problem Relation Age of Onset   Hypertension Mother    Cancer Father    Bone cancer Sister    Social History   Socioeconomic History   Marital status: Married    Spouse name: Not on file   Number of children: Not on file   Years of education: Not on  file   Highest education level: Not on file  Occupational History   Not on file  Tobacco Use   Smoking status: Never   Smokeless tobacco: Never  Substance and Sexual Activity   Alcohol use: Yes    Alcohol/week: 14.0 standard drinks of alcohol    Types: 14 Cans of beer per week   Drug use: No   Sexual activity: Not on file  Other Topics Concern   Not on file  Social History Narrative   Not on file   Social Determinants of Health   Financial Resource Strain: Low Risk  (05/04/2022)   Overall Financial Resource Strain (CARDIA)    Difficulty of Paying Living Expenses: Not hard at all  Food Insecurity: No Food Insecurity (05/04/2022)   Hunger Vital Sign    Worried About Running Out  of Food in the Last Year: Never true    Ran Out of Food in the Last Year: Never true  Transportation Needs: No Transportation Needs (05/04/2022)   PRAPARE - Hydrologist (Medical): No    Lack of Transportation (Non-Medical): No  Physical Activity: Inactive (05/04/2022)   Exercise Vital Sign    Days of Exercise per Week: 0 days    Minutes of Exercise per Session: 0 min  Stress: No Stress Concern Present (05/04/2022)   [place]    Feeling of Stress : Only a little  Social Connections: Moderately Isolated (05/04/2022)   Social Connection and Isolation Panel [NHANES]    Frequency of Communication with Friends and Family: Once a week    Frequency of Social Gatherings with Friends and Family: Once a week    Attends Religious Services: Never    Marine scientist or Organizations: Yes    Attends Music therapist: 1 to 4 times per year    Marital Status: Married    Tobacco Counseling Counseling given: Not Answered   Clinical Intake:  Pre-visit preparation completed: Yes  Pain : No/denies pain     BMI - recorded: 38.35 Nutritional Status: BMI > 30  Obese Nutritional Risks: None Diabetes: No  How often do you need to have someone help you when you read instructions, pamphlets, or other written materials from your doctor or pharmacy?: 1 - Never  Diabetic?no  Interpreter Needed?: No  Information entered by :: Charlott Rakes, LPN   Activities of Daily Living    05/04/2022    1:40 PM  In your present state of health, do you have any difficulty performing the following activities:  Hearing? 0  Vision? 0  Difficulty concentrating or making decisions? 0  Walking or climbing stairs? 1  Comment related to legs give out  Dressing or bathing? 0  Doing errands, shopping? 0  Preparing Food and eating ? N  Using the Toilet? N  In the past six months, have you accidently  leaked urine? Y  Comment at times  Do you have problems with loss of bowel control? N  Managing your Medications? N  Managing your Finances? N  Housekeeping or managing your Housekeeping? N    Patient Care Team: Loralee Pacas, MD as PCP - General (Internal Medicine) Sueanne Margarita, MD as PCP - Cardiology (Cardiology) Allyn Kenner, MD (Dermatology) [facility])  Indicate any recent Medical Services you may have received from other than [facility] providers in the past year (date may be  approximate).     Assessment:   This is a routine wellness examination for Sam[place].  Hearing/Vision screen Hearing Screening - Comments:: Pt denies any hearing issues  Vision Screening - Comments:: Pt follows up with [facility] for annual eye exams   Dietary issues and exercise activities discussed: Current Exercise Habits: The patient does not participate in regular exercise at present   Goals Addressed             This Visit's Progress    Patient Stated       Patient Stated       Get healthier and not be so anxious       Depression Screen    05/04/2022    1:34 PM 03/14/2022    1:01 PM  PHQ 2/9 Scores  PHQ - 2 Score 4 6  PHQ- 9 Score 14 18    Fall Risk    05/04/2022    1:40 PM 05/04/2022    1:38 PM 03/14/2022    1:00 PM  [place] in the past year?  1 1  Number falls in past yr:  1 1  Injury with Fall?  0 0  Risk for fall due to : Impaired vision;History of fall(s)  History of fall(s)  Follow up Falls prevention discussed  Falls evaluation completed    FALL RISK PREVENTION PERTAINING TO THE HOME:  Any stairs in or around the home? Yes  If so, are there any without handrails? No  Home free of loose throw rugs in walkways, pet beds, electrical cords, etc? Yes  Adequate lighting in your home to reduce risk of falls? Yes   ASSISTIVE DEVICES UTILIZED TO PREVENT FALLS:  Life alert? No  Use of a cane,  walker or w/c? No  Grab bars in the bathroom? No  Shower chair or bench in shower? No  Elevated toilet seat or a handicapped toilet? No   TIMED UP AND GO:  Was the test performed? Yes .  Length of time to ambulate 10 feet: 20 sec.   Gait steady and fast without use of assistive device  Cognitive Function:        05/04/2022    1:44 PM  6CIT Screen  What Year? 0 points  What month? 0 points  What time? 0 points  Count back from 20 0 points  Months in reverse 0 points  Repeat phrase 8 points  Total Score 8 points    Immunizations Immunization History  Administered Date(s) Administered   Fluad Quad(high Dose 65+) 03/12/2016, 02/07/2018, 03/29/2021, 02/14/2022   Influenza, High Dose Seasonal PF 03/30/2015, 03/09/2019, 03/21/2020   Influenza,trivalent, recombinat, inj, PF 04/02/2013   PFIZER Comirnaty(Gray Top)Covid-19 Tri-Sucrose Vaccine 09/03/2020   PFIZER(Purple Top)SARS-COV-2 Vaccination 07/04/2019, 07/25/2019, 02/28/2020   Pfizer Covid-19 Vaccine Bivalent Booster 78yr & up 03/29/2021, 02/21/2022   Pneumococcal Conjugate-13 10/13/2014   Pneumococcal Polysaccharide-23 11/28/2015   Tdap 10/13/2014   Zoster Recombinat (Shingrix) 05/18/2019, 05/17/2020   Zoster, Live 01/15/2019    TDAP status: Up to date  Flu Vaccine status: Up to date  Pneumococcal vaccine status: Up to date  Covid-19 vaccine status: Completed vaccines  Qualifies for Shingles Vaccine? Yes   Zostavax completed Yes   Shingrix Completed?: Yes  Screening Tests Health Maintenance  Topic Date Due   COVID-19 Vaccine (7 - 2023-24 season) 04/18/2022   Hepatitis C Screening  03/15/2023 (Originally 11/22/1968)   Medicare Annual Wellness (AWV)  05/05/2023   DTaP/Tdap/Td (2 - Td or Tdap) 10/12/2024  COLONOSCOPY (Pts 45-61yr Insurance coverage will need to be confirmed)  04/24/2031   Pneumonia Vaccine 71 Years old  Completed   INFLUENZA VACCINE  Completed   Zoster Vaccines- Shingrix  Completed   HPV  VACCINES  Aged Out    Health Maintenance  Health Maintenance Due  Topic Date Due   COVID-19 Vaccine (7 - 2023-24 season) 04/18/2022    Colorectal cancer screening: Type of screening: Colonoscopy. Completed 04/23/21. Repeat every 10 years  Additional Screening:  Hepatitis C Screening: does qualify;  Vision Screening: Recommended annual ophthalmology exams for early detection of glaucoma and other disorders of the eye. Is the patient up to date with their annual eye exam?  Yes  Who is the provider or what is the name of the office in which the patient attends annual eye exams? [facility]  If pt is not established with a provider, would they like to be referred to a provider to establish care? No .   Dental Screening: Recommended annual dental exams for proper oral hygiene  Community Resource Referral / Chronic Care Management: CRR required this visit?  No   CCM required this visit?  No      Plan:     I have personally reviewed and noted the following in the patient's chart:   Medical and social history Use of alcohol, tobacco or illicit drugs  Current medications and supplements including opioid prescriptions. Patient is not currently taking opioid prescriptions. Functional ability and status Nutritional status Physical activity Advanced directives List of other physicians Hospitalizations, surgeries, and ER visits in previous 12 months Vitals Screenings to include cognitive, depression, and falls Referrals and appointments  In addition, I have reviewed and discussed with patient certain preventive protocols, quality metrics, and best practice recommendations. A written personalized care plan for preventive services as well as general preventive health recommendations were provided to patient.     TWillette Brace LPN   [id number]  Nurse Notes: Pt was completed in person

## 2022-06-03 ENCOUNTER — Other Ambulatory Visit: Payer: Self-pay | Admitting: Cardiology

## 2022-06-09 ENCOUNTER — Other Ambulatory Visit: Payer: Self-pay | Admitting: Internal Medicine

## 2022-06-09 DIAGNOSIS — N138 Other obstructive and reflux uropathy: Secondary | ICD-10-CM

## 2022-06-09 DIAGNOSIS — N3943 Post-void dribbling: Secondary | ICD-10-CM

## 2022-06-10 NOTE — Telephone Encounter (Signed)
Refilled as requested  

## 2022-06-14 ENCOUNTER — Ambulatory Visit (INDEPENDENT_AMBULATORY_CARE_PROVIDER_SITE_OTHER): Payer: Medicare PPO | Admitting: Clinical

## 2022-06-14 DIAGNOSIS — F419 Anxiety disorder, unspecified: Secondary | ICD-10-CM | POA: Diagnosis not present

## 2022-06-14 NOTE — Progress Notes (Signed)
[place] Counselor Initial Adult Exam  Name: Calvin Escobar Date: 06/14/2022 MRN: [id number] DOB: [birth year] PCP: Loralee Pacas, MD  Time spent: 2:40pm - 3:27pm   Guardian/Payee:  NA    Paperwork requested:  NA  Reason for Visit /Presenting Problem: Patient reported he is looking for a therapist and stated, "I have problems with mental illness, I have problems with depression and anxiety". Patient stated, "its ongoing" and reported symptoms have gotten worse lately. Patient reported he was seeing a therapist until the beginning of December and a psychiatrist until the middle of December. Patient reported he has participated in treatment with providers for years.   Mental Status Exam: Appearance:   Could not assess      Behavior:  Could not assess  Motor:  Could not assess  Speech/Language:   Clear and Coherent  Affect:  Could not assess  Mood:  depressed  Thought process:  tangential  Thought content:    Tangential  Sensory/Perceptual disturbances:    WNL  Orientation:  oriented to person, place, day of week, and year  Attention:  Good  Concentration:  Good  Memory:  Patient reported difficulty recalling recent information  Fund of knowledge:   Good  Insight:    Fair  Judgment:   Good  Impulse Control:  Fair    Reported Symptoms:  Patient stated, "not doing well at all" in regards to current mood, crying, stated "I get really really sad", anger, difficulty falling asleep, "I stay asleep too long", no appetite, anxiety, shakes when anxious, restlessness. Patient reported symptoms have occurred for years and have progressively worsened.   Risk Assessment: Danger to Self:  No denied current and past suicidal ideation and symptoms of psychosis Self-injurious Behavior: No Danger to Others: No denied current and past homicidal ideation Duty to Warn:no Physical Aggression / Violence:No  Access to Firearms a concern: No  Gang Involvement:No  Patient / guardian  was educated about steps to take if suicide or homicide risk level increases between visits: yes While future psychiatric events cannot be accurately predicted, the patient does not currently require acute inpatient psychiatric care and does not currently meet [facility] involuntary commitment criteria.  Substance Abuse History: Current substance abuse: No current use. Patient stated, "I was an alcoholic" and reported he drank alcohol from ages [age], 4-5 cans of beer daily. Patient reported no history of tobacco use.  Patient reported no current or past drug use.   Past Psychiatric History:   Previous psychological history is significant for anxiety and depression Outpatient Providers: history of therapy with Celesta Gentile and medication management with Dr. Casimiro Needle at [place]. Patient reported he participates in [facility] meetings through the [facility] in [place] History of Psych Hospitalization: Yes patient reported inpatient treatment for 2 weeks around [facility] area and for a week in [place]:  none reported    Abuse History:  Victim of: Yes.  ,  patient reported a history of being bullied as a child    Report needed: No. Victim of Neglect:No. Perpetrator of  none   Witness / Exposure to Domestic Violence: No   Protective Services Involvement: No  Witness to [employer] Violence:  No   Family History:  Family History  Problem Relation Age of Onset   Hypertension Mother    Cancer Father    Bone cancer Sister   Alcohol use - son   Living situation: the patient lives with their family (wife and son)  Sexual Orientation: Straight  Relationship Status: married  Name of spouse / other: Bevely Palmer If a parent, number of children / ages: 52 year old son and one son who is deceased as a result of alcohol use  Support Systems: [employer], spouse  Financial Stress:  No   Income/Employment/Disability: Social Engineer, 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 History of legal issue / charges:  none  Medical History/Surgical History: reviewed Past Medical History:  Diagnosis Date   Allergy seasonal   Ascending aorta dilatation ([place])    38 mm by 2D echo 04/2021   Cancer ([place]) skin   DCM (dilated cardiomyopathy) ([place])    nonischemic with normal coronary arteries at cath 06/2019.  EF 35 to 40% on echo 04/2021   Dementia ([place])    Depression    Hyperlipidemia    OSA treated with BiPAP    Urinary dribbling 03/14/2022    Past Surgical History:  Procedure Laterality Date   COLONOSCOPY     RIGHT/LEFT HEART CATH AND CORONARY ANGIOGRAPHY N/A 07/03/2019   Procedure: RIGHT/LEFT HEART CATH AND CORONARY ANGIOGRAPHY;  Surgeon: Jolaine Artist, MD;  Location: [facility];  Service: Cardiovascular;  Laterality: N/A;    Medications: Current Outpatient Medications  Medication Sig Dispense Refill   calcium carbonate (OSCAL) 1500 (600 Ca) MG TABS tablet Take 600 mg of elemental calcium by mouth daily.     carvedilol (COREG) 12.5 MG tablet Take 1 tablet (12.5 mg total) by mouth 2 (two) times daily with a meal. 30 tablet 0   LORazepam (ATIVAN) 1 MG tablet 1 tablet by mouth every morning and 2 tablets by mouth at bedtime     losartan (COZAAR) 50 MG tablet Take 1 tablet (50 mg total) by mouth daily. Replaces entresto, start after done with entresto 90 tablet 3   Multiple Vitamins-Minerals (MULTIVITAMIN WITH  MINERALS) tablet Take 1 tablet by mouth daily.     multivitamin (ONE-A-DAY MEN'S) TABS tablet Take 1 tablet by mouth daily. 100 tablet 3   pravastatin (PRAVACHOL) 20 MG tablet Take 20 mg by mouth at bedtime.      Semaglutide,0.25 or 0.'5MG'$ /DOS, (OZEMPIC, 0.25 OR 0.5 MG/DOSE,) 2 MG/3ML SOPN Inject 0.25 mg into the skin once a week for 28 days, THEN 0.5 mg once a week for 28 days. 3 mL 2   tamsulosin (FLOMAX) 0.4 MG CAPS capsule TAKE 1 CAPSULE BY MOUTH EVERY DAY 90 capsule 1   traZODone (DESYREL) 100 MG tablet Take 200 mg by mouth at bedtime.     venlafaxine XR (EFFEXOR-XR) 75 MG 24 hr capsule      zolpidem (AMBIEN) 10 MG tablet Take 10 mg by mouth at bedtime.     No current facility-administered medications for this visit.    No Known Allergies  Diagnoses:  Anxiety disorder, unspecified type Unspecified Depressive Disorder R/O Major Depressive Disorder  Plan of Care: Clinician conducted initial assessment via telephone from clinician's office at [facility] due to patient experiencing difficulty logging onto [employer]. Patient provided verbal consent to proceed  with telehealth session and participated in session from patient's home. Patient is a 72 year old male who presented for an initial assessment. Patient reported he is looking for a therapist and stated, "I have problems with mental illness, I have problems with depression and anxiety" when clinician inquired about reason for today's visit. Patient stated, "not doing well at all" in regards to current mood. Patient reported the following symptoms:  crying, sadness, anger, difficulty falling asleep, stated "I stay asleep too long", no appetite, anxiety, shakes when anxious, and restlessness. Patient reported symptoms have been present for years and have progressively worsened recently. Patient denied current and past suicidal ideation, homicidal ideation, and symptoms of psychosis. Patient reported no current substance use. Patient stated,  "I was an alcoholic" and reported he drank alcohol from ages [age], 4-5 cans of beer daily. Patient reported no history of tobacco use.  Patient reported no current or past drug use. Patient reported he previously participated in therapy and received medication management with a psychiatrist at [place].  Patient reported he currently participates in [facility] meetings through the [facility]. Patient reported a history of two previous psychiatric hospitalizations. Patient reported being isolated in the home, hearing information regarding events in the news, and the relationship with his son are current stressors. Patient identified his wife and participation in [facility] meetings as current supports. It is recommended patient follow up with current psychiatrist and recommended patient participate in individual therapy. In addition, it is recommended patient participate in family therapy with his son to improve their relationship. Clinician will review recommendations and treatment plan with patient during follow up appointment.    Katherina Right, LCSW

## 2022-06-14 NOTE — Progress Notes (Signed)
                Chianne Byrns, LCSW 

## 2022-06-18 ENCOUNTER — Other Ambulatory Visit: Payer: Self-pay | Admitting: Cardiology

## 2022-07-01 ENCOUNTER — Other Ambulatory Visit: Payer: Self-pay | Admitting: Cardiology

## 2022-07-02 ENCOUNTER — Other Ambulatory Visit: Payer: Self-pay | Admitting: *Deleted

## 2022-07-06 ENCOUNTER — Ambulatory Visit (INDEPENDENT_AMBULATORY_CARE_PROVIDER_SITE_OTHER): Payer: Medicare PPO | Admitting: Clinical

## 2022-07-06 DIAGNOSIS — F419 Anxiety disorder, unspecified: Secondary | ICD-10-CM

## 2022-07-06 NOTE — Progress Notes (Signed)
[facility] Counselor/Therapist Progress Note  Patient ID: Calvin Escobar, MRN: [id number]    Date: 07/06/22  Time Spent: 10:33  am - 11: am : 39 Minutes  Treatment Type: Individual Therapy.  Reported Symptoms: Patient reported experiencing mood swings that are difficult to control  Mental Status Exam: Appearance:  Could not assess      Behavior: Could not assess  Motor: Could not assess  Speech/Language:  Clear and Coherent  Affect: Could not assess  Mood: normal  Thought process: tangential  Thought content:   Tangential  Sensory/Perceptual disturbances:   WNL  Orientation: oriented to person, place, and situation  Attention: Fair  Concentration: Good  Memory: Patient reported difficulty recalling recent information  Fund of knowledge:  Good  Insight:   Fair  Judgment:  Good  Impulse Control: Fair   Risk Assessment: Danger to Self:  No Patient denied current suicidal ideation Self-injurious Behavior: No Danger to Others: No Patient denied current homicidal ideation Duty to Warn:no Physical Aggression / Violence:No  Access to Firearms a concern: No  Gang Involvement:No   Subjective:  Patient stated, "I have to control the triggers that I get that affect my personality", "I have these major mood swings where I go from calm to almost radical", "I have a hard time controlling them". Patient reported he is trying to stay positive, attends [facility] meetings through the [facility], and  signed up for two classes that last five weeks through the [facility]. Patient reported a history of using alcohol "to cope". Patient reported he currently sees a psychiatrist through [facility] but would like to transition to a new psychiatrist. Patient reported he was seeing a therapist but reported it was not a good fit. Patient stated, "I consider myself bipolar". Patient reported he becomes angry and "goes in the attack mode" in response to  conflict with his son. Patient stated, "I really don't think its a major issue" in response to diagnosis of Dementia. Patient reported difficulty recalling recent information. Patient reported concern regarding the psychotropic medications he is currently being prescribed. Patient reported he has an upcoming appointment with his psychiatrist.   Interventions: Motivational Interviewing. Clinician conducted session via telephone at patient's request from clinician's home office due to difficulty navigating [employer]. Patient provided verbal consent to proceed with telehealth session and participated in session from patient's home. Clinician reviewed diagnoses and treatment recommendations. Provided psycho education related to diagnoses and treatment. Clinician recommended patient follow up with current psychiatrist to discuss his concerns regarding medication and potentially changing providers. Patient stated, "that's fine" in response to recommendation for individual therapy. Patient reported he is open to family therapy and will ask family members about their participation. Clinician requested patient write down his thoughts related to potential goals for therapy for homework.   Diagnosis:  Anxiety disorder, unspecified type Unspecified Depressive Disorder R/O Major Depressive Disorder  Plan: To be determined during follow up appointment on 07/27/22                   Katherina Right, LCSW

## 2022-07-18 ENCOUNTER — Encounter: Payer: Self-pay | Admitting: Internal Medicine

## 2022-07-18 ENCOUNTER — Ambulatory Visit (INDEPENDENT_AMBULATORY_CARE_PROVIDER_SITE_OTHER): Payer: Medicare PPO | Admitting: Internal Medicine

## 2022-07-18 VITALS — BP 120/86 | HR 84 | Temp 97.7°F | Ht 72.0 in | Wt 279.6 lb

## 2022-07-18 DIAGNOSIS — F419 Anxiety disorder, unspecified: Secondary | ICD-10-CM

## 2022-07-18 DIAGNOSIS — F3131 Bipolar disorder, current episode depressed, mild: Secondary | ICD-10-CM

## 2022-07-18 DIAGNOSIS — M1990 Unspecified osteoarthritis, unspecified site: Secondary | ICD-10-CM

## 2022-07-18 DIAGNOSIS — F03918 Unspecified dementia, unspecified severity, with other behavioral disturbance: Secondary | ICD-10-CM

## 2022-07-18 DIAGNOSIS — I5022 Chronic systolic (congestive) heart failure: Secondary | ICD-10-CM

## 2022-07-18 DIAGNOSIS — J42 Unspecified chronic bronchitis: Secondary | ICD-10-CM

## 2022-07-18 DIAGNOSIS — I77810 Thoracic aortic ectasia: Secondary | ICD-10-CM

## 2022-07-18 DIAGNOSIS — F1021 Alcohol dependence, in remission: Secondary | ICD-10-CM

## 2022-07-18 DIAGNOSIS — N3943 Post-void dribbling: Secondary | ICD-10-CM

## 2022-07-18 MED ORDER — OZEMPIC (0.25 OR 0.5 MG/DOSE) 2 MG/3ML ~~LOC~~ SOPN: 0.2500 mg | PEN_INJECTOR | SUBCUTANEOUS | 3 refills | Status: DC

## 2022-07-18 NOTE — Assessment & Plan Note (Signed)
We tried increasing Flomax and he feels its much improved Encouraged continuing with that Unfortunately  still having to wear diapers/special underwear

## 2022-07-18 NOTE — Assessment & Plan Note (Addendum)
>>  ASSESSMENT AND PLAN FOR BIPOLAR AFFECTIVE DISORDER, DEPRESSED (HCC) WRITTEN ON 07/18/2022  8:17 PM BY [name], MD  Seems well controlled but he does report severe anxiety... But he is on complex regimen developed by psychiatry so I prefer not to try to adjust it Did agree to get psychiatry referral due to he would like more medication adjustment but encouraged him to not leave current psychiatry Dr. Lily Peer. 6cit score on Annual Wellness Visit (AWV) suggested memory loss could be impacting this .  >>ASSESSMENT AND PLAN FOR ANXIETY WRITTEN ON 07/18/2022  8:10 PM BY [name], MD  Making alternative psychiatry referral at patient request Encouraged continuing with current psychiatry for now- don't expect to change anytime soon

## 2022-07-18 NOTE — Assessment & Plan Note (Signed)
Making alternative psychiatry referral at patient request Encouraged continuing with current psychiatry for now- don't expect to change anytime soon

## 2022-07-18 NOTE — Progress Notes (Signed)
[name]: [id number]   Routine Medical Office Visit  Patient:  Calvin Escobar      Age: 72 y.o.       Sex:  male  Date:   07/18/2022  PCP:    Loralee Pacas, [place] Provider: Loralee Pacas, MD   Problem Focused Charting:   Medical Decision Making per Assessment/Plan   Shakil was seen today for 2 month follow-up.  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 Quit July 2016 again. Going to AA   Generalized arthritis Overview: Referred for physical therapy but he didn't go   Urinary dribbling Overview: Requires diapers and special underwear that has been getting less wet with flomax Causes odor.    Assessment & Plan: We tried increasing Flomax and he feels its much improved Encouraged continuing with that Unfortunately  still having to wear diapers/special underwear   Chronic systolic congestive heart failure (HCC) -     Ozempic (0.25 or 0.5 MG/DOSE); Inject 0.25 mg into the skin once a week.  Dispense: 3 mL; Refill: 3  Bipolar affective disorder, currently depressed, mild (HCC) Overview: History of lithium toxicity -currently not on mood stabilizer  Noted history of bipolar 1 in chart. And self endorses the diagnosis  Following with [place] groups/classes Used to following with [facility] with Dr. Milagros Reap  but felt it wasn't for him. Following with behavioral health counselor    Assessment & Plan: Seems well controlled but he does report severe anxiety... But he is on complex regimen developed by psychiatry so I prefer not to try to adjust it Did agree to get psychiatry referral due to he would like more medication adjustment but encouraged him to not leave current psychiatry Dr. Milagros Reap. 6cit score on Annual Wellness Visit (AWV) suggested memory loss could be impacting this .   Morbid obesity ([place])  Dementia with behavioral disturbance (HCC)  Chronic bronchitis, unspecified chronic bronchitis type ([place]) Overview: Annotation: winter Chronic wheezing    Ascending aorta dilatation (HCC) Overview: 38 mm by 2D echo 04/2021    Declined bloodwork today although its due- there is issues with insurance saying Annual Wellness Visit (AWV) was repeated twice but patient's doesn't think he did at [facility]- they refusing to pay- handed off to admin at checkout to address and hopefully resolve.  Encouraged to try again on Ozempic due to coronary artery disease benefits and morbid obesity.  We might try for victoza if not able to get. Encouraged physical therapy again due to fall risk and arthritis.  Requested we get him alternative psychiatry - feels current anxiety medications inadequate and not adjusted    Subjective - Clinical Presentation:   Calvin Escobar is a 72 y.o. male  Patient Active Problem List   Diagnosis Date Noted   Generalized arthritis 07/18/2022   Morbid obesity ([place]) 07/18/2022   Urinary dribbling 03/14/2022  Ascending aorta dilatation (HCC) [id number]   Chronic systolic heart failure ([place]) 07/30/2019   Anxiety 07/17/2019   PAC (premature atrial contraction) 07/17/2019   DCM (dilated cardiomyopathy) (HCC)    OSA treated with BiPAP    Primary insomnia 08/05/2017   Dementia ([place])    Hyperlipidemia    Hypokalemia    PUD (peptic ulcer disease) 01/12/2015   Chronic  major depressive disorder, recurrent episode ([place]) 10/13/2014   Alcoholism in remission ([place]) 09/18/2012   Benign prostatic hyperplasia with urinary obstruction 03/27/2012   Obesity (BMI 30-39.9) 03/27/2012   Bipolar affective disorder, depressed ([place]) 05/10/2007   Chronic bronchitis ([place]) 05/10/2007   SKIN CANCER, HX OF 05/10/2007   Past Medical History:  Diagnosis Date   Allergy seasonal   Ascending aorta dilatation (HCC)    38 mm by 2D echo 04/2021   Cancer ([place]) skin   DCM (dilated cardiomyopathy) ([place])    nonischemic with normal coronary arteries at cath 06/2019.  EF 35 to 40% on echo 04/2021   Dementia ([place])    Depression    Hyperlipidemia    Lithium toxicity 01/29/2016   OSA treated with BiPAP    Urinary dribbling 03/14/2022    Outpatient Medications Prior to Visit  Medication Sig   calcium carbonate (OSCAL) 1500 (600 Ca) MG TABS tablet Take 600 mg of elemental calcium by mouth daily.   carvedilol (COREG) 12.5 MG tablet Take 1 tablet (12.5 mg total) by mouth 2 (two) times daily with a meal.   LORazepam (ATIVAN) 1 MG tablet 1 tablet by mouth every morning and 2 tablets by mouth at bedtime   losartan (COZAAR) 50 MG tablet Take 1 tablet (50 mg total) by mouth daily. Replaces entresto, start after done with entresto   Multiple Vitamins-Minerals (MULTIVITAMIN WITH MINERALS) tablet Take 1 tablet by mouth daily.   multivitamin (ONE-A-DAY MEN'S) TABS tablet Take 1 tablet by mouth daily.   pravastatin (PRAVACHOL) 20 MG tablet Take 20 mg by mouth at bedtime.    tamsulosin (FLOMAX) 0.4 MG CAPS capsule TAKE 1 CAPSULE BY MOUTH EVERY DAY   traZODone (DESYREL) 100 MG tablet Take 200 mg by mouth at bedtime.   venlafaxine XR (EFFEXOR-XR) 75 MG 24 hr capsule    zolpidem (AMBIEN) 10 MG tablet Take 10 mg by mouth at bedtime.   No facility-administered medications prior to visit.      Chief Complaint  Patient presents with   2 month follow-up    HPI  Main issue is anxiety , managed  with [facility] and behavioral health.  Has been treated with psychiatry medications from Dr. Ardis Rowan.   Didn't do physical therapy for the arthritis Staying sober Didn't get Ozempic due to insurance. I wanted for the coronary artery disease        Objective:  Physical Exam  BP 120/86 (BP Location: Left Arm, Patient Position: Sitting)   Pulse 84   Temp 97.7 F (36.5 C) (Temporal)   Ht 6' (1.829 m)   Wt 279 lb 9.6 oz (126.8 kg)   SpO2 96%   BMI 37.92 kg/m  Obese  by BMI criteria but truncal adiposity (waist circumference or caliper) should be used instead. Wt Readings from Last 10 Encounters:  07/18/22 279 lb 9.6 oz (126.8 kg)  05/14/22 282 lb 3.2 oz (128 kg)  05/04/22 282 lb 12.8 oz (128.3 kg)  03/14/22 280 lb 3.2 oz (127.1 kg)  04/18/21 275 lb (124.7 kg)  04/14/21 277 lb (125.6 kg)  09/09/20 284 lb 9.6  oz (129.1 kg)  02/19/20 273 lb (123.8 kg)  10/28/19 287 lb (130.2 kg)  09/29/19 291 lb 6.4 oz (132.2 kg)   Vital signs reviewed.  Nursing notes reviewed. Weight trend reviewed. General Appearance:  Well developed, well nourished male in no acute distress.   Normal work of breathing at rest Musculoskeletal: All extremities are intact.  Neurological:  Awake, alert,  No obvious focal neurological deficits or cognitive impairments Psychiatric:  Appropriate mood, pleasant demeanor Problem-specific findings:    about the same amount anxious as  last visit, very stiff standing up has to use hands to rise from seat, morbid obesity, very nice guy.      Signed: Loralee Pacas, MD 07/18/2022 8:18 PM

## 2022-07-18 NOTE — Assessment & Plan Note (Signed)
>>  ASSESSMENT AND PLAN FOR URINARY DRIBBLING WRITTEN ON 07/18/2022  1:21 PM BY [name], MD  We tried increasing Flomax and he feels its much improved Encouraged continuing with that Unfortunately  still having to wear diapers/special underwear

## 2022-07-27 ENCOUNTER — Ambulatory Visit (INDEPENDENT_AMBULATORY_CARE_PROVIDER_SITE_OTHER): Payer: Medicare PPO | Admitting: Clinical

## 2022-07-27 DIAGNOSIS — F419 Anxiety disorder, unspecified: Secondary | ICD-10-CM | POA: Diagnosis not present

## 2022-07-27 NOTE — Progress Notes (Signed)
[facility] Counselor/Therapist Progress Note  Patient ID: Calvin Escobar, MRN: [id number]    Date: 07/27/22  Time Spent: 1:36  pm - 2:22 pm : 46 Minutes  Treatment Type: Individual Therapy.  Reported Symptoms: Patient reported he experiences increased anxiety every morning.  Mental Status Exam: Appearance:  Could not assess      Behavior: Could not assess  Motor: Could not assess  Speech/Language:  Clear and Coherent  Affect: Could not assess  Mood: normal  Thought process: normal  Thought content:   WNL  Sensory/Perceptual disturbances:   WNL  Orientation: oriented to person, place, and situation  Attention: Good  Concentration: Good  Memory: WNL    Fund of knowledge:  Good  Insight:   Fair  Judgment:  Good  Impulse Control: Fair   Risk Assessment: Danger to Self:  No Patient denied current suicidal ideation Self-injurious Behavior: No Danger to Others: No Patient denied current homicidal ideation Duty to Warn:no Physical Aggression / Violence:No  Access to Firearms a concern: No  Gang Involvement:No   Subjective:  Patient stated, "my life is kind of changing drastically" and reported he has increased his involvement in the [employer]. Patient reported he is taking three classes through the [facility] and reported he is starting a peer support program through the [employer]. Patient stated, "its kind of up and down" in response to mood since last session. Patient reported he experiences increased anxiety every morning. Patient reported he wants to become more social. Patient reported he feels his wife has a difficult time understanding what triggers changes in patient's mood. Patient reported his wife indicated she would participate in family therapy but is unsure of son's willingness to participate in therapy. Patient reported he would like to "discover the root causes of my anxiety and depression", "identify the triggers that cause my  unfavorable reactions, such as, family issues, news stories", "find ways to have positive thoughts", daily use of the serenity prayer and apply prayer, walking, social activities in response to goals for therapy. Patient reported "set boundaries for myself", "join social groups, learn how to avoid triggers that bring on negative thoughts", "take a portion of the day for outside activities", "be more outspoken in social meetings", and increase in involvement in group discussions in response to goals for therapy.   Interventions: Motivational Interviewing. Clinician conducted session via telephone at patient's request from clinician's home office due to difficulty navigating WebEx. Patient provided verbal consent to proceed with telehealth session and participated in session from patient's home. Reviewed recommendation for family therapy. Clinician utilized motivational interviewing to explore potential goals for therapy. Clinician utilized a task centered approach in collaboration with patient to being to develop goals for therapy. Goals to be finalized during follow up appointment.   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 Frequency: bi-weekly  Modality: individual     Long-term goal:   Patient stated, "having a positive attitude all the time" in response to long term goal.   Reduce overall level, frequency, and intensity of the feelings of depression and anxiety as evidenced by decreased irritability, depressed mood, anxiety, and fluctuations in mood from 7 days/week to 0 to 1 days/week per patient report for at least 3 consecutive months. Target Date: 07/28/23  Progress: 0   Short-term goal:  Identify, challenge, and  replace negative thought patterns  that contribute to feelings of depression, anxiety, irritability, and fluctuations in mood with positive thoughts and beliefs per patient's report Target Date: 07/27/22  Progress: 0   Identified triggers for fluctuations in mood  Target Date: 07/27/22  Progress: 0   Develop and implement coping strategies to utilize in response to triggers to decrease fluctuations in mood  Target Date: 07/27/22  Progress: 0                   Katherina Right, LCSW

## 2022-07-30 ENCOUNTER — Other Ambulatory Visit: Payer: Self-pay | Admitting: Cardiology

## 2022-08-01 DIAGNOSIS — D225 Melanocytic nevi of trunk: Secondary | ICD-10-CM | POA: Diagnosis not present

## 2022-08-01 DIAGNOSIS — Z08 Encounter for follow-up examination after completed treatment for malignant neoplasm: Secondary | ICD-10-CM | POA: Diagnosis not present

## 2022-08-01 DIAGNOSIS — Z86006 Personal history of melanoma in-situ: Secondary | ICD-10-CM | POA: Diagnosis not present

## 2022-08-01 DIAGNOSIS — C44319 Basal cell carcinoma of skin of other parts of face: Secondary | ICD-10-CM | POA: Diagnosis not present

## 2022-08-01 DIAGNOSIS — Z1283 Encounter for screening for malignant neoplasm of skin: Secondary | ICD-10-CM | POA: Diagnosis not present

## 2022-08-13 DIAGNOSIS — F1010 Alcohol abuse, uncomplicated: Secondary | ICD-10-CM | POA: Diagnosis not present

## 2022-08-13 DIAGNOSIS — F321 Major depressive disorder, single episode, moderate: Secondary | ICD-10-CM | POA: Diagnosis not present

## 2022-08-17 ENCOUNTER — Ambulatory Visit: Payer: Medicare PPO | Admitting: Clinical

## 2022-08-29 ENCOUNTER — Other Ambulatory Visit: Payer: Self-pay | Admitting: Internal Medicine

## 2022-08-29 ENCOUNTER — Other Ambulatory Visit: Payer: Self-pay

## 2022-08-29 ENCOUNTER — Telehealth: Payer: Self-pay

## 2022-08-29 ENCOUNTER — Other Ambulatory Visit (INDEPENDENT_AMBULATORY_CARE_PROVIDER_SITE_OTHER): Payer: Medicare PPO

## 2022-08-29 DIAGNOSIS — Z5181 Encounter for therapeutic drug level monitoring: Secondary | ICD-10-CM

## 2022-08-29 DIAGNOSIS — I420 Dilated cardiomyopathy: Secondary | ICD-10-CM

## 2022-08-29 DIAGNOSIS — F32A Depression, unspecified: Secondary | ICD-10-CM | POA: Diagnosis not present

## 2022-08-29 DIAGNOSIS — F339 Major depressive disorder, recurrent, unspecified: Secondary | ICD-10-CM

## 2022-08-29 DIAGNOSIS — Z79899 Other long term (current) drug therapy: Secondary | ICD-10-CM

## 2022-08-29 LAB — COMPREHENSIVE METABOLIC PANEL
ALT: 13 U/L (ref 0–53)
AST: 16 U/L (ref 0–37)
Albumin: 4.3 g/dL (ref 3.5–5.2)
Alkaline Phosphatase: 71 U/L (ref 39–117)
BUN: 20 mg/dL (ref 6–23)
CO2: 31 mEq/L (ref 19–32)
Calcium: 9.3 mg/dL (ref 8.4–10.5)
Chloride: 101 mEq/L (ref 96–112)
Creatinine, Ser: 1.14 mg/dL (ref 0.40–1.50)
GFR: 63.69 mL/min (ref >60.00)
Glucose, Bld: 107 mg/dL — ABNORMAL HIGH (ref 70–99)
Potassium: 5.4 mEq/L — ABNORMAL HIGH (ref 3.5–5.1)
Sodium: 140 mEq/L (ref 135–145)
Total Bilirubin: 0.5 mg/dL (ref 0.2–1.2)
Total Protein: 7.3 g/dL (ref 6.0–8.3)

## 2022-08-29 LAB — CBC WITH DIFFERENTIAL/PLATELET
Basophils Absolute: 0.1 10*3/uL (ref 0.0–0.1)
Basophils Relative: 0.8 % (ref 0.0–3.0)
Eosinophils Absolute: 0.2 10*3/uL (ref 0.0–0.7)
Eosinophils Relative: 2.1 % (ref 0.0–5.0)
HCT: 41.4 % (ref 39.0–52.0)
Hemoglobin: 13.7 g/dL (ref 13.0–17.0)
Lymphocytes Relative: 20.9 % (ref 12.0–46.0)
Lymphs Abs: 1.5 10*3/uL (ref 0.7–4.0)
MCHC: 33.2 g/dL (ref 30.0–36.0)
MCV: 89.5 fl (ref 78.0–100.0)
Monocytes Absolute: 0.5 10*3/uL (ref 0.1–1.0)
Monocytes Relative: 6.9 % (ref 3.0–12.0)
Neutro Abs: 4.9 10*3/uL (ref 1.4–7.7)
Neutrophils Relative %: 69.3 % (ref 43.0–77.0)
Platelets: 170 10*3/uL (ref 150.0–400.0)
RBC: 4.62 Mil/uL (ref 4.22–5.81)
RDW: 14.5 % (ref 11.5–15.5)
WBC: 7.1 10*3/uL (ref 4.0–10.5)

## 2022-08-29 LAB — LIPID PANEL
Cholesterol: 138 mg/dL (ref 0–200)
HDL: 40.8 mg/dL (ref >39.00)
LDL Cholesterol: 69 mg/dL (ref 0–99)
NonHDL: 96.89
Total CHOL/HDL Ratio: 3
Triglycerides: 138 mg/dL (ref 0.0–149.0)
VLDL: 27.6 mg/dL (ref 0.0–40.0)

## 2022-08-29 LAB — TSH: TSH: 1.42 u[IU]/mL (ref 0.35–5.50)

## 2022-08-29 NOTE — Telephone Encounter (Signed)
When labs from today 08/29/22 are resulted, please fax results to [phone]

## 2022-08-30 LAB — HEPATITIS C ANTIBODY: Hepatitis C Ab: NONREACTIVE

## 2022-08-30 LAB — HIV ANTIBODY (ROUTINE TESTING W REFLEX): HIV 1&2 Ab, 4th Generation: NONREACTIVE

## 2022-08-30 LAB — VALPROIC ACID LEVEL: Valproic Acid Lvl: 29.3 mg/L — ABNORMAL LOW (ref 50.0–100.0)

## 2022-09-02 ENCOUNTER — Encounter: Payer: Self-pay | Admitting: Internal Medicine

## 2022-09-27 ENCOUNTER — Telehealth: Payer: Self-pay | Admitting: Pharmacist

## 2022-09-27 DIAGNOSIS — I5022 Chronic systolic (congestive) heart failure: Secondary | ICD-10-CM

## 2022-09-27 NOTE — Telephone Encounter (Signed)
This patient has been identified as "high risk" and in the top 25% of risk stratification for [facility] accountable patients.  These patients were identified using a number of factors including # of hospitalizations, ED visits, HF exacerbations, elevated BP and A1c, and overall cost of care.   Referral placed for cosign by the PCP.  CMCS team to schedule once cosigned.  Kaelon Weekes, PharmD Clinical Pharmacist   [place] [phone]  

## 2022-09-28 ENCOUNTER — Telehealth: Payer: Self-pay | Admitting: Pharmacist

## 2022-09-28 NOTE — Progress Notes (Signed)
Care Management & Coordination Services Pharmacy Team  Reason for Encounter: Appointment Reminder  Contacted patient to confirm in office appointment with Erskine Emery, PharmD on 10/01/2022 at 2:30 pm. Spoke with patient on 09/28/2022   Do you have any problems getting your medications? No If yes what types of problems are you experiencing?  none  What is your top health concern you would like to discuss at your upcoming visit? Anxiety, Heart health  Have you seen any other providers since your last visit with PCP? No   Chart review:  Recent office visits:  07/18/2022 OV (PCP) [name], MD; no medication changes indicated.  05/14/2022 OV (PCP) [name], MD; no medication changes indicated.  Recent consult visits:  None  Hospital visits:  None in previous 6 months   Star Rating Drugs:  Losartan 50 mg last filled 09/06/2022 90 DS Pravastatin 20 mg last filled 07/12/2022 90 DS   Care Gaps: Annual wellness visit in last year? Yes   Future Appointments  Date Time Provider Department Center  10/01/2022  2:30 PM [name], [place] [facility] None  11/21/2022  1:20 PM [name], MD [facility]  05/17/2023  1:00 PM [facility] [facility] VISIT 1 [facility]   [name], [facility] Clinical Pharmacist Assistant [phone]

## 2022-10-01 ENCOUNTER — Ambulatory Visit: Payer: Medicare PPO | Admitting: Pharmacist

## 2022-10-01 NOTE — Progress Notes (Unsigned)
Care Management & Coordination Services Pharmacy Note  10/03/2022 Name:  Calvin Escobar MRN:  [id number] DOB:  May 25, 1951  Summary: PharmD Initial visit.  Main concern discussed today was patient's anxiety and depression.  Very tearful during our visit today, however, determined to get better and verbalizes the desire to not live the rest of his life this way  Recommendations/Changes made from today's visit: Consult with PCP/Psych consider additive therapies for bipolar/anxiety such as Vraylar?  Follow up plan: FU 3 months CMA to check in monthly   Subjective: Calvin Escobar is an 72 y.o. year old male who is a primary patient of [name], MD.  The care coordination team was consulted for assistance with disease management and care coordination needs.    Engaged with patient face to face for initial visit.  Recent office visits:  07/18/2022 OV (PCP) [name], MD; no medication changes indicated.   05/14/2022 OV (PCP) [name], MD; no medication changes indicated.   Recent consult visits:  None   Hospital visits:  None in previous 6 months   Objective:  Lab Results  Component Value Date   CREATININE 1.14 08/29/2022   BUN 20 08/29/2022   GFR 63.69 08/29/2022   EGFR 81 04/14/2021   GFRNONAA 58 (L) 11/13/2019   GFRAA 67 11/13/2019   NA 140 08/29/2022   K 5.4 No hemolysis seen (H) 08/29/2022   CALCIUM 9.3 08/29/2022   CO2 31 08/29/2022   GLUCOSE 107 (H) 08/29/2022    Lab Results  Component Value Date/Time   GFR 63.69 08/29/2022 12:46 PM    Last diabetic Eye exam: No results found for: "HMDIABEYEEXA"  Last diabetic Foot exam: No results found for: "HMDIABFOOTEX"   Lab Results  Component Value Date   CHOL 138 08/29/2022   HDL 40.80 08/29/2022   LDLCALC 69 08/29/2022   TRIG 138.0 08/29/2022   CHOLHDL 3 08/29/2022       Latest Ref Rng & Units 08/29/2022   12:46 PM 10/28/2019    4:29 PM 09/12/2013    2:25 PM  Hepatic Function  Total Protein  6.0 - 8.3 g/dL 7.3  7.0  7.6   Albumin 3.5 - 5.2 g/dL 4.3  4.2  3.6   AST 0 - 37 U/L 16  18  24    ALT 0 - 53 U/L 13  11  18    Alk Phosphatase 39 - 117 U/L 71  85  64   Total Bilirubin 0.2 - 1.2 mg/dL 0.5  0.4  0.4     Lab Results  Component Value Date/Time   TSH 1.42 08/29/2022 12:46 PM   TSH 2.290 10/28/2019 04:29 PM       Latest Ref Rng & Units 08/29/2022   12:46 PM 10/28/2019    4:29 PM 08/14/2019   12:09 PM  CBC  WBC 4.0 - 10.5 K/uL 7.1  7.9  9.4   Hemoglobin 13.0 - 17.0 g/dL 40.9  81.1  91.4   Hematocrit 39.0 - 52.0 % 41.4  42.8  46.6   Platelets 150.0 - 400.0 K/uL 170.0  225  208     Lab Results  Component Value Date/Time   VITAMINB12 367 01/30/2016 05:40 AM    Clinical ASCVD: No  The 10-year ASCVD risk score (Arnett DK, et al., 2019) is: 18.5%   Values used to calculate the score:     Age: 89 years     Sex: Male     Is Non-Hispanic African American: No  Diabetic: No     Tobacco smoker: No     Systolic Blood Pressure: 120 mmHg     Is BP treated: No     HDL Cholesterol: 40.8 mg/dL     Total Cholesterol: 138 mg/dL         16/06/958    4:54 PM 03/14/2022    1:01 PM  Depression screen PHQ 2/9  Decreased Interest 2 3  Down, Depressed, Hopeless 2 3  PHQ - 2 Score 4 6  Altered sleeping 2 3  Tired, decreased energy 2 3  Change in appetite 2 3  Feeling bad or failure about yourself  2 3  Trouble concentrating 2 0  Moving slowly or fidgety/restless 0 0  Suicidal thoughts 0 0  PHQ-9 Score 14 18  Difficult doing work/chores  Very difficult     Social History   Tobacco Use  Smoking Status Never  Smokeless Tobacco Never   BP Readings from Last 3 Encounters:  07/18/22 120/86  05/14/22 118/69  05/04/22 122/78   Pulse Readings from Last 3 Encounters:  07/18/22 84  05/14/22 78  05/04/22 76   Wt Readings from Last 3 Encounters:  07/18/22 279 lb 9.6 oz (126.8 kg)  05/14/22 282 lb 3.2 oz (128 kg)  05/04/22 282 lb 12.8 oz (128.3 kg)   BMI Readings  from Last 3 Encounters:  07/18/22 37.92 kg/m  05/14/22 38.27 kg/m  05/04/22 38.35 kg/m    No Known Allergies  Medications Reviewed Today     Reviewed by [name], [facility] (Pharmacist) on 10/03/22 at 1559  Med List Status: <None>   Medication Order Taking? Sig Documenting Provider Last Dose Status Informant  calcium carbonate (OSCAL) 1500 (600 Ca) MG TABS tablet [id number]  Take 600 mg of elemental calcium by mouth daily. [provider]  Active Spouse/Significant Other  carvedilol (COREG) 12.5 MG tablet [id number]  Take 1 tablet (12.5 mg total) by mouth 2 (two) times daily with a meal. Turner, Cornelious Bryant, MD  Active   LORazepam (ATIVAN) 1 MG tablet [id number]  1 tablet by mouth every morning and 2 tablets by mouth at bedtime [provider]  Active   losartan (COZAAR) 50 MG tablet [id number]  Take 1 tablet (50 mg total) by mouth daily. Replaces entresto, start after done with Vara Guardian, MD  Active   Multiple Vitamins-Minerals (MULTIVITAMIN WITH MINERALS) tablet [id number]  Take 1 tablet by mouth daily. [provider]  Active Spouse/Significant Other  multivitamin (ONE-A-DAY MEN'S) TABS tablet [id number]  Take 1 tablet by mouth daily. [name], MD  Active   pravastatin (PRAVACHOL) 20 MG tablet [id number]  Take 20 mg by mouth at bedtime.  [provider]  Active Spouse/Significant Other  Semaglutide,0.25 or 0.5MG /DOS, (OZEMPIC, 0.25 OR 0.5 MG/DOSE,) 2 MG/3ML SOPN [id number]  Inject 0.25 mg into the skin once a week. [name], MD  Active   tamsulosin [facility]) 0.4 MG CAPS capsule [id number]  TAKE 1 CAPSULE BY MOUTH EVERY DAY [name], MD  Active   traZODone (DESYREL) 100 MG tablet [id number]  Take 200 mg by mouth at bedtime. [provider]  Active   venlafaxine XR (EFFEXOR-XR) 75 MG 24 hr capsule [id number]   [provider]  Active   zolpidem (AMBIEN) 10 MG tablet [id number] No Take 10 mg by mouth at  bedtime.  Patient not taking: Reported on 10/03/2022   [provider] Not Taking Active  SDOH:  (Social Determinants of Health) assessments and interventions performed: Yes Financial Resource Strain: Low Risk  (10/03/2022)   Overall Financial Resource Strain (CARDIA)    Difficulty of Paying Living Expenses: Not hard at all   Food Insecurity: No Food Insecurity (10/03/2022)   Hunger Vital Sign    Worried About Running Out of Food in the Last Year: Never true    Ran Out of Food in the Last Year: Never true    SDOH Interventions    Flowsheet Row Clinical Support from 05/04/2022 in [facility]  SDOH Interventions   Food Insecurity Interventions Intervention Not Indicated  Housing Interventions Intervention Not Indicated  Transportation Interventions Intervention Not Indicated  Depression Interventions/Treatment  Medication, Counseling  Financial Strain Interventions Intervention Not Indicated  Physical Activity Interventions Intervention Not Indicated  Stress Interventions Intervention Not Indicated  Social Connections Interventions Intervention Not Indicated       Medication Assistance: None required.  Patient affirms current coverage meets needs.  Medication Access: Within the past 30 days, how often has patient missed a dose of medication? 0 Is a pillbox or other method used to improve adherence? Yes  Factors that may affect medication adherence? no barriers identified Are meds synced by current pharmacy? No  Are meds delivered by current pharmacy? No  Does patient experience delays in picking up medications due to transportation concerns? No   [facility] Services Reviewed: Is patient disadvantaged to use [facility]?: Yes  Current Rx insurance plan: [employer] Name and location of Current pharmacy:  [facility] [phone] [place], [place] - [address] [place] [address] Phone: [phone] Fax:  [phone]  [facility] Pharm[facility] services reviewed with patient today?: Yes  Patient requests to transfer care to [facility] Pharmacy?: No  Reason patient declined to change pharmacies: Disadvantaged due to insurance/mail order  Compliance/Adherence/Medication fill history:  Star Rating Drugs:  Losartan 50 mg last filled 09/06/2022 90 DS Pravastatin 20 mg last filled 07/12/2022 90 DS     Care Gaps: Annual wellness visit in last year? 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%)  -Current treatment: Losartan 50mg  Appropriate, Effective, Safe, Accessible Carvedilol 12.5mg  bid Appropriate, Effective, Safe, Accessible -Medications previously tried: Chief Financial Officer ($$)  -Current home BP/HR readings: controlled -Current home daily weights: monitoring fluids/etc., weights have been steady -Educated on Benefits of medications for managing symptoms and prolonging life Importance of weighing daily; if you gain more than 3 pounds in one day or 5 pounds in one week, contact providers -Recommended to continue current medication  Depression/Anxiety (Goal: Improve Quality of life, improve symptoms) -Uncontrolled -Current treatment: Venlafaxine ER 150mg  daily Appropriate, Query effective, ,  Trazodone 200mg  hs Appropriate, Query effective, ,  Lorazepam 1mg  prn Appropriate, Query effective, ,  -Medications previously tried/failed: none noted -PHQ9:     05/04/2022    1:34 PM  03/14/2022    1:01 PM  PHQ9 SCORE ONLY  PHQ-9 Total Score 14 18    -Educated on Benefits of medication for symptom control Benefits of cognitive-behavioral therapy with or without  medication - Patient expresses real interest in a second opinion from another psychiatrist.  I believe he feels this is holding back his progress.  He was very tearful with me during our visit today, however, still very determined to get better and expressed no signs of self harm.  He wants to get back to his normal life.  Will discuss with PCP best course of action.   Could consider additional treatment such as Vraylar?  Willa Frater, PharmD Clinical Pharmacist  [facility] [phone]

## 2022-10-03 ENCOUNTER — Other Ambulatory Visit: Payer: Self-pay | Admitting: Cardiology

## 2022-10-03 NOTE — Telephone Encounter (Signed)
This was taken care of on 4/2.

## 2022-10-07 ENCOUNTER — Other Ambulatory Visit: Payer: Self-pay | Admitting: Internal Medicine

## 2022-10-07 DIAGNOSIS — F3131 Bipolar disorder, current episode depressed, mild: Secondary | ICD-10-CM

## 2022-10-07 MED ORDER — VRAYLAR 1.5 & 3 MG PO CPPK: 1.0000 | ORAL_CAPSULE | Freq: Every day | ORAL | 3 refills | Status: DC

## 2022-10-08 DIAGNOSIS — H3550 Unspecified hereditary retinal dystrophy: Secondary | ICD-10-CM | POA: Diagnosis not present

## 2022-10-10 NOTE — Telephone Encounter (Signed)
In case you can't reach him on his phone please call him @ [phone]

## 2022-10-10 NOTE — Telephone Encounter (Signed)
Pt is calling back, he states he is waiting for a call back. Please advise.

## 2022-10-15 NOTE — Telephone Encounter (Signed)
Leafy Kindle is requiring a PA.  If it is expensive after the PA I can help him with patient assistance.

## 2022-10-16 NOTE — Telephone Encounter (Signed)
Left vm for patient to call the office back.

## 2022-10-22 ENCOUNTER — Other Ambulatory Visit (HOSPITAL_COMMUNITY): Payer: Self-pay

## 2022-10-22 ENCOUNTER — Telehealth: Payer: Self-pay | Admitting: Internal Medicine

## 2022-10-22 ENCOUNTER — Telehealth: Payer: Self-pay

## 2022-10-22 NOTE — Telephone Encounter (Signed)
Pharmacy Patient Advocate Encounter   Received notification that prior authorization for Vraylar 1.5 & 3MG  capsules is required/requested.   PA submitted on 10/22/22 to (ins) [employer] via CoverMyMeds [facility] or [facility]) confirmation # [id number] Status is pending

## 2022-10-22 NOTE — Telephone Encounter (Signed)
Patient Advocate Encounter  Prior Authorization for Vraylar 1.5 & 3MG  capsules has been approved with [employer].    PA# [id number] Effective dates: 06/04/22 through 06/04/23  Per [facility] test claim, copay for 7 days supply is $99

## 2022-10-22 NOTE — Telephone Encounter (Signed)
Pt would like a new referral to a phycologist or Psychiatric. Please advise and call pt back with details.

## 2022-10-22 NOTE — Telephone Encounter (Signed)
Adding, pt would like the office to do virtual because he has issues with transportation.

## 2022-10-24 NOTE — Telephone Encounter (Signed)
I called and spoke with pts wife to let her know PA was approved, she stated she will be picking it up today.

## 2022-10-25 NOTE — Telephone Encounter (Signed)
Patient called stating he no longer wants to take this medication.

## 2022-11-02 NOTE — Telephone Encounter (Signed)
Sent pt message via MyChart

## 2022-11-11 ENCOUNTER — Other Ambulatory Visit: Payer: Self-pay | Admitting: Internal Medicine

## 2022-11-11 DIAGNOSIS — N3943 Post-void dribbling: Secondary | ICD-10-CM

## 2022-11-11 DIAGNOSIS — N401 Enlarged prostate with lower urinary tract symptoms: Secondary | ICD-10-CM

## 2022-11-14 DIAGNOSIS — F321 Major depressive disorder, single episode, moderate: Secondary | ICD-10-CM | POA: Diagnosis not present

## 2022-11-21 ENCOUNTER — Ambulatory Visit (INDEPENDENT_AMBULATORY_CARE_PROVIDER_SITE_OTHER): Payer: Medicare PPO | Admitting: Internal Medicine

## 2022-11-21 ENCOUNTER — Encounter: Payer: Self-pay | Admitting: Internal Medicine

## 2022-11-21 VITALS — BP 112/80 | HR 80 | Temp 97.9°F | Ht 72.0 in | Wt 267.2 lb

## 2022-11-21 DIAGNOSIS — F3131 Bipolar disorder, current episode depressed, mild: Secondary | ICD-10-CM | POA: Diagnosis not present

## 2022-11-21 DIAGNOSIS — Z6836 Body mass index (BMI) 36.0-36.9, adult: Secondary | ICD-10-CM | POA: Diagnosis not present

## 2022-11-21 DIAGNOSIS — I420 Dilated cardiomyopathy: Secondary | ICD-10-CM | POA: Diagnosis not present

## 2022-11-21 DIAGNOSIS — I10 Essential (primary) hypertension: Secondary | ICD-10-CM

## 2022-11-21 DIAGNOSIS — E7800 Pure hypercholesterolemia, unspecified: Secondary | ICD-10-CM | POA: Diagnosis not present

## 2022-11-21 DIAGNOSIS — E875 Hyperkalemia: Secondary | ICD-10-CM

## 2022-11-21 DIAGNOSIS — R32 Unspecified urinary incontinence: Secondary | ICD-10-CM | POA: Diagnosis not present

## 2022-11-21 DIAGNOSIS — F5101 Primary insomnia: Secondary | ICD-10-CM

## 2022-11-21 HISTORY — DX: Unspecified urinary incontinence: R32

## 2022-11-21 LAB — BASIC METABOLIC PANEL
BUN: 16 mg/dL (ref 6–23)
CO2: 31 mEq/L (ref 19–32)
Calcium: 9.2 mg/dL (ref 8.4–10.5)
Chloride: 102 mEq/L (ref 96–112)
Creatinine, Ser: 1.12 mg/dL (ref 0.40–1.50)
GFR: 64.95 mL/min (ref >60.00)
Glucose, Bld: 106 mg/dL — ABNORMAL HIGH (ref 70–99)
Potassium: 4.5 mEq/L (ref 3.5–5.1)
Sodium: 141 mEq/L (ref 135–145)

## 2022-11-21 LAB — HEMOGLOBIN A1C: Hgb A1c MFr Bld: 5.4 % (ref 4.6–6.5)

## 2022-11-21 LAB — B12 AND FOLATE PANEL
Folate: >23.8 ng/mL (ref >5.9)
Vitamin B-12: 305 pg/mL (ref 211–911)

## 2022-11-21 NOTE — Assessment & Plan Note (Signed)
Today's assessment and plan included in todays problem overview update 

## 2022-11-21 NOTE — Progress Notes (Signed)
[facility]: [phone]   Routine Medical Office Visit  Patient:  Calvin Escobar      Age: 72 y.o.       Sex:  male  Date:   11/21/2022 PCP:    [name], MD   Today's Healthcare Provider: [name], MD   Assessment and Plan:   Danile was seen today for 4 month follow-up, depression and anxiety.  Bipolar affective disorder, currently depressed, mild (HCC) Overview: History of lithium toxicity -currently not on mood stabilizer  Noted history of bipolar 1 in chart. And self endorses the diagnosis  Following with [name]/classes Used to following with [facility] with Dr. Lily Peer but felt it wasn't for him. Following with behavioral health counselor  Following with peer support [name] who manages 2 of the classes he goes to. 3-4 classes a week.   >>OVERVIEW FOR ANXIETY WRITTEN ON 07/18/2022  1:24 PM BY [name], MD  He is taking zolpidem 10 mg daily trazodone 100 mg twice daily venlafaxine 150 mg daily lorazepam 1 mg twice 3 times daily 1 in the morning 2 at bedtime Frustrated that anxiey not well controlled, encouraged him to see his psych soon Following with greg from [facility] Following with [facility] [place] [facility]    >>OVERVIEW FOR CHRONIC MAJOR DEPRESSIVE DISORDER, RECURRENT EPISODE (HCC) WRITTEN ON 03/14/2022  1:18 PM BY [name], MD  Formatting of this note might be different from the original. Followed at [facility].  Next appointment is February 23.  Assessment & Plan: Requests more support today not doing well, issues with tearfulness persist Encouraged patient to check in with [facility], and gave list of alternative psychiatry at patient request.  Anxiety and Sleep Disorder: They are experiencing high levels of anxiety and sleep disturbances, with recent adjustments made to their psychiatric medications by a psychiatrist. They are currently on Venlafaxine 150mg , Divalproex  250mg  at bedtime, Trazodone 300mg  at bedtime, and Lorazepam 1mg  in the evening. We will continue their current medications and consider adding a multivitamin. We will check their thyroid, B12, and folate levels to rule out potential contributors to mood disorders. We will also consider a referral to a different psychiatrist if they remain unsatisfied with their current care.     Orders: -     Basic metabolic panel -     B12 and Folate Panel -     TSH Rfx on Abnormal to Free T4 -     Hemoglobin A1c; Future -     AMB Referral to [facility] Coordinaton ([facility] Patients)  Urinary incontinence, unspecified type Overview:  Urinary Incontinence: They are wearing disposable diapers daily due to urinary incontinence, primarily nocturnal, and are currently on Tamsulosin (Flomax). We will refer them to Urology for further evaluation and potential treatment options and continue Tamsulosin until they are seen by Urology.  Assessment & Plan: Today's assessment and plan included in todays problem overview update   Orders: -     Ambulatory referral to Urology -     AMB Referral to [facility] Coordinaton ([facility] Patients)  Pure hypercholesterolemia Overview: Medications: not yet discussed Lab Results  Component Value Date   HDL 40.80 08/29/2022   CHOLHDL 3 08/29/2022   Lab Results  Component Value Date   LDLCALC 69 08/29/2022   Lab Results  Component Value Date   TRIG 138.0 08/29/2022   Lab Results  Component Value Date   CHOL 138 08/29/2022   The  10-year ASCVD risk score (Arnett DK, et al., 2019) is: 16.6%   Values used to calculate the score:     Age: 3 years     Sex: Male     Is Non-Hispanic African American: No     Diabetic: No     Tobacco smoker: No     Systolic Blood Pressure: 112 mmHg     Is BP treated: No     HDL Cholesterol: 40.8 mg/dL     Total Cholesterol: 138 mg/dL Lab Results  Component Value Date   ALT 13 08/29/2022   AST 16 08/29/2022   ALKPHOS 71 08/29/2022    TSH 1.42 08/29/2022   Body mass index is 36.24 kg/m.  Lipoprotein(a), Apolipoprotein B (ApoB), and High-sensitivity C-reactive protein (hs-CRP) No results found for: "HSCRP", "LIPOA"  Assessment & Plan:  Improving Your Cholesterol: Diet: Focus on a Mediterranean-style diet, limit saturated fats and sugars, and increase omega-3 fatty acids (fish, flaxseeds,nuts,extra virgin olive oil, avocados). Exercise: Engage in regular physical activity (aerobic exercises are particularly beneficial for HDL). Weight Management: Maintain a healthy weight. Smoking Cessation: Quitting smoking improves cholesterol levels.  Orders: -     AMB Referral to [facility] Coordinaton ([facility] Patients)  Hyperkalemia Overview: Associated with losartan Advised patient low potassium diet 08/2022  Lab Results  Component Value Date/Time   K 5.4 No hemolysis seen (H) 08/29/2022 12:46 PM   K 5.0 04/14/2021 11:25 AM   K 5.3 (H) 09/09/2020 03:41 PM   K 5.0 11/13/2019 12:00 AM   K 5.4 (H) 10/28/2019 04:29 PM   K 4.8 08/14/2019 12:09 PM   K 5.2 07/24/2019 04:10 PM     Assessment & Plan: Recheck, encouraged low k diet again.   Orders: -     Basic metabolic panel -     [facility] ([facility])  DCM (dilated cardiomyopathy) (HCC) Overview: nonischemic with normal coronary arteries at cath 05/2019 Ef 25% 2021-> 40% 04/2021 Following with cardiology Dr. Mayford Knife   Assessment & Plan: Encouraged continuing with carvediolol  Orders: -     AMB Referral to [facility]ordinaton ([facility] Patients)  Primary insomnia Overview: On ativan, trazodone 300, working well Off Ambien Managed by [facility] psychiatry   Orders: -     Basic metabolic panel -     B12 and Folate Panel -     TSH Rfx on Abnormal to Free T4 -     Hemoglobin A1c; Future -     AMB Referral to [facility] ([facility])  Morbid obesity (HCC) -     Basic metabolic panel -     B12 and  Folate Panel -     TSH Rfx on Abnormal to Free T4 -     Hemoglobin A1c; Future -     AMB Referral to [facility] ([facility] Patients)  Hypertension, unspecified type Assessment & Plan: Hypertension: Their hypertension is controlled on Losartan 50mg , though their potassium levels have been high in the past. We will check their potassium levels and consider changing Losartan if potassium remains high.      General Health Maintenance: We will check for diabetes as a potential cause of their urinary issues, enroll them in a chronic care management program for additional support, and they will return for a follow-up in one month to review lab results and medication regimen.         Clinical Presentation:    72 y.o. male here today for 4  month follow-up, Depression (Medication (vraylar) is not really helping. Sometimes has episodes of crying spells.), and Anxiety (Says that he worries about everything.)  AI-Extracted: Discussed the use of AI scribe software for clinical note transcription with the patient, who gave verbal consent to proceed.  History of Present Illness   The patient, with a history of anxiety, sleep disorder, and urinary incontinence, has been actively seeking solutions to improve their health. They have been attending multiple classes and peer support meetings weekly, which they report as beneficial. However, they have been experiencing difficulties with their psychiatrist, leading to a two-month period without medication, exacerbating their anxiety and sleep disorder.  The patient reports a significant issue with urinary incontinence, necessitating the daily use of disposable diapers. They express frustration with this issue, particularly with nocturnal incontinence. Despite this, they consider their anxiety, sleep disorder, and eating habits as more pressing concerns.  The patient's medication regimen includes venlafaxine 150mg , divalproex 250mg , trazodone 300mg ,  losartan 50mg , tamsulosin, lorazepam 1mg , and carvedilol. They report that since resuming their medications, their sleep has not improved. They also express uncertainty about some of their medications, as their spouse manages their medication administration.  The patient has been considering changing psychiatrists due to dissatisfaction with their current one. They express a desire for a psychiatrist who can effectively manage their medications and help them deal with their mental health issues. They have been exploring options for a new psychiatrist and are open to recommendations.  The patient acknowledges some weight gain but attributes this to poor eating habits, including skipping breakfast and lunch and snacking at night. Despite their health issues, they remain committed to improving their wellness and managing their anxiety.        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   Benign prostatic hyperplasia with urinary obstruction 03/27/2012   PAC (premature atrial contraction) 07/17/2019   Primary insomnia 08/05/2017   PUD (peptic ulcer disease) 01/12/2015   Urinary dribbling 03/14/2022   Generalized arthritis 07/18/2022   Morbid obesity (HCC) 03/27/2012   Urinary incontinence 11/21/2022   Hypertension 11/21/2022   Resolved Ambulatory Problems    Diagnosis Date Noted   SKIN CANCER, HX OF 05/10/2007   Lithium toxicity 01/29/2016   Past Medical History:  Diagnosis Date   Allergy seasonal   Cancer (HCC) skin   Depression     Outpatient Medications Prior to Visit  Medication Sig   Cariprazine HCl  (VRAYLAR) 1.5 & 3 MG CPPK Take 1 tablet by mouth daily at 6 (six) AM. Take by mouth as directed. Day 1-Start at 1.5 mg/day.  After Day 15-May increase dose to 3 mg/day long term   carvedilol (COREG) 12.5 MG tablet Take 1 tablet (12.5 mg total) by mouth 2 (two) times daily with a meal.   divalproex (DEPAKOTE ER) 250 MG 24 hr tablet Take 250 mg by mouth daily.   LORazepam (ATIVAN) 1 MG tablet 1 tablet by mouth every morning and 2 tablets by mouth at bedtime   losartan (COZAAR) 50 MG tablet Take 1 tablet (50 mg total) by mouth daily. Replaces entresto, start after  done with entresto   pravastatin (PRAVACHOL) 20 MG tablet Take 20 mg by mouth at bedtime.    tamsulosin (FLOMAX) 0.4 MG CAPS capsule TAKE 1 CAPSULE BY MOUTH EVERY DAY   traZODone (DESYREL) 100 MG tablet Take 200 mg by mouth at bedtime.   venlafaxine XR (EFFEXOR-XR) 150 MG 24 hr capsule Take 150 mg by mouth daily with breakfast.   [DISCONTINUED] calcium carbonate (OSCAL) 1500 (600 Ca) MG TABS tablet Take 600 mg of elemental calcium by mouth daily.   [DISCONTINUED] multivitamin (ONE-A-DAY MEN'S) TABS tablet Take 1 tablet by mouth daily.   [DISCONTINUED] Multiple Vitamins-Minerals (MULTIVITAMIN WITH MINERALS) tablet Take 1 tablet by mouth daily. (Patient not taking: Reported on 11/21/2022)   [DISCONTINUED] Semaglutide,0.25 or 0.5MG /DOS, (OZEMPIC, 0.25 OR 0.5 MG/DOSE,) 2 MG/3ML SOPN Inject 0.25 mg into the skin once a week. (Patient not taking: Reported on 11/21/2022)   [DISCONTINUED] venlafaxine XR (EFFEXOR-XR) 75 MG 24 hr capsule  (Patient not taking: Reported on 11/21/2022)   [DISCONTINUED] zolpidem (AMBIEN) 10 MG tablet Take 10 mg by mouth at bedtime. (Patient not taking: Reported on 10/03/2022)   No facility-administered medications prior to visit.         Clinical Data Analysis:   Physical Exam  BP 112/80 (BP Location: Left Arm, Patient Position: Sitting)   Pulse 80   Temp 97.9 F (36.6 C) (Temporal)   Ht 6' (1.829 m)   Wt 267 lb 3.2  oz (121.2 kg)   SpO2 97%   BMI 36.24 kg/m  Wt Readings from Last 10 Encounters:  11/21/22 267 lb 3.2 oz (121.2 kg)  07/18/22 279 lb 9.6 oz (126.8 kg)  05/14/22 282 lb 3.2 oz (128 kg)  05/04/22 282 lb 12.8 oz (128.3 kg)  03/14/22 280 lb 3.2 oz (127.1 kg)  04/18/21 275 lb (124.7 kg)  04/14/21 277 lb (125.6 kg)  09/09/20 284 lb 9.6 oz (129.1 kg)  02/19/20 273 lb (123.8 kg)  10/28/19 287 lb (130.2 kg)   Vital signs reviewed.  Nursing notes reviewed. Weight trend reviewed. Abnormalities and Problem-Specific physical exam findings:  truncal adiposity tearful  General Appearance:  No acute distress appreciable.   Well-groomed, healthy-appearing male.  Well proportioned with no abnormal fat distribution.  Good muscle tone. Skin: Clear and well-hydrated. Pulmonary:  Normal work of breathing at rest, no respiratory distress apparent. SpO2: 97 %  Musculoskeletal: All extremities are intact.  Neurological:  Awake, alert, oriented, and engaged.  No obvious focal neurological deficits or cognitive impairments.  Sensorium seems unclouded.   Speech is clear and coherent with logical content. Psychiatric:  sad mood, pleasant and cooperative demeanor, thoughtful and engaged during the exam  Results Reviewed:    Results for orders placed or performed in visit on 11/21/22  Basic Metabolic Panel (BMET)  Result Value Ref Range   Sodium 141 135 - 145 mEq/L   Potassium 4.5 3.5 - 5.1 mEq/L   Chloride 102 96 - 112 mEq/L   CO2 31 19 - 32 mEq/L   Glucose, Bld 106 (H) 70 - 99 mg/dL   BUN 16 6 - 23 mg/dL   Creatinine, Ser 1.61 0.40 - 1.50 mg/dL   GFR 09.60 >45.40 mL/min   Calcium 9.2 8.4 - 10.5 mg/dL  J81 and Folate Panel  Result Value Ref Range   Vitamin B-12 305 211 - 911 pg/mL   Folate >23.8 >5.9 ng/mL  Hemoglobin A1c  Result Value Ref Range   Hgb A1c MFr Bld 5.4 4.6 - 6.5 %  Office Visit on 11/21/2022  Component Date Value   Sodium 11/21/2022 141    Potassium 11/21/2022 4.5    Chloride  11/21/2022 102    CO2 11/21/2022 31    Glucose, Bld 11/21/2022 106 (H)    BUN 11/21/2022 16    Creatinine, Ser 11/21/2022 1.12    GFR 11/21/2022 64.95    Calcium 11/21/2022 9.2    Vitamin B-12 11/21/2022 305    Folate 11/21/2022 >23.8    Hgb A1c MFr Bld 11/21/2022 5.4   Lab on 08/29/2022  Component Date Value   Valproic Acid Lvl 08/29/2022 29.3 (L)    TSH 08/29/2022 1.42    HIV 1&2 Ab, 4th Generati* 08/29/2022 NON-REACTIVE    Hepatitis C Ab 08/29/2022 NON-REACTIVE    Cholesterol 08/29/2022 138    Triglycerides 08/29/2022 138.0    HDL 08/29/2022 40.80    VLDL 08/29/2022 27.6    LDL Cholesterol 08/29/2022 69    Total CHOL/HDL Ratio 08/29/2022 3    NonHDL 08/29/2022 96.89    Sodium 08/29/2022 140    Potassium 08/29/2022 5.4 No hemolysis seen (H)    Chloride 08/29/2022 101    CO2 08/29/2022 31    Glucose, Bld 08/29/2022 107 (H)    BUN 08/29/2022 20    Creatinine, Ser 08/29/2022 1.14    Total Bilirubin 08/29/2022 0.5    Alkaline Phosphatase 08/29/2022 71    AST 08/29/2022 16    ALT 08/29/2022 13    Total Protein 08/29/2022 7.3    Albumin 08/29/2022 4.3    GFR 08/29/2022 63.69    Calcium 08/29/2022 9.3    WBC 08/29/2022 7.1    RBC 08/29/2022 4.62    Hemoglobin 08/29/2022 13.7    HCT 08/29/2022 41.4    MCV 08/29/2022 89.5    MCHC 08/29/2022 33.2    RDW 08/29/2022 14.5    Platelets 08/29/2022 170.0    Neutrophils Relative % 08/29/2022 69.3    Lymphocytes Relative 08/29/2022 20.9    Monocytes Relative 08/29/2022 6.9    Eosinophils Relative 08/29/2022 2.1    Basophils Relative 08/29/2022 0.8    Neutro Abs 08/29/2022 4.9    Lymphs Abs 08/29/2022 1.5    Monocytes Absolute 08/29/2022 0.5    Eosinophils Absolute 08/29/2022 0.2    Basophils Absolute 08/29/2022 0.1    No image results found.   No results found.     This encounter employed real-time, collaborative documentation. The patient actively reviewed and updated their medical record on a shared screen, ensuring  transparency and facilitating joint problem-solving for the problem list, overview, and plan. This approach promotes accurate, informed care. The treatment plan was discussed and reviewed in detail, including medication safety, potential side effects, and all patient questions. We confirmed understanding and comfort with the plan. Follow-up instructions were established, including contacting the office for any concerns, returning if symptoms worsen, persist, or new symptoms develop, and precautions for potential emergency department visits. ----------------------------------------------------- [name], MD  11/21/2022 5:13 PM  [facility]:  [phone]

## 2022-11-21 NOTE — Assessment & Plan Note (Signed)
Recheck, encouraged low k diet again.

## 2022-11-21 NOTE — Assessment & Plan Note (Addendum)
Hypertension: Their hypertension is controlled on Losartan 50mg , though their potassium levels have been high in the past. We will check their potassium levels and consider changing Losartan if potassium remains high.

## 2022-11-21 NOTE — Assessment & Plan Note (Addendum)
Requests more support today not doing well, issues with tearfulness persist Encouraged patient to check in with [facility], and gave list of alternative psychiatry at patient request.  Anxiety and Sleep Disorder: They are experiencing high levels of anxiety and sleep disturbances, with recent adjustments made to their psychiatric medications by a psychiatrist. They are currently on Venlafaxine 150mg , Divalproex 250mg  at bedtime, Trazodone 300mg  at bedtime, and Lorazepam 1mg  in the evening. We will continue their current medications and consider adding a multivitamin. We will check their thyroid, B12, and folate levels to rule out potential contributors to mood disorders. We will also consider a referral to a different psychiatrist if they remain unsatisfied with their current care.

## 2022-11-21 NOTE — Assessment & Plan Note (Signed)
Encouraged continuing with carvediolol

## 2022-11-21 NOTE — Assessment & Plan Note (Signed)
  Improving Your Cholesterol: Diet: Focus on a Mediterranean-style diet, limit saturated fats and sugars, and increase omega-3 fatty acids (fish, flaxseeds,nuts,extra virgin olive oil, avocados). Exercise: Engage in regular physical activity (aerobic exercises are particularly beneficial for HDL). Weight Management: Maintain a healthy weight. Smoking Cessation: Quitting smoking improves cholesterol levels.

## 2022-11-22 LAB — TSH RFX ON ABNORMAL TO FREE T4: TSH: 0.968 u[IU]/mL (ref 0.450–4.500)

## 2022-11-23 ENCOUNTER — Telehealth: Payer: Self-pay | Admitting: *Deleted

## 2022-11-23 NOTE — Progress Notes (Signed)
  Care Coordination   Note   11/23/2022 Name: Calvin Escobar MRN: [id number] DOB: [birth year]  [name] is a 72 y.o. year old male who sees [name], MD for primary care. I reached out to [name] by phone today to offer care coordination services.  [name] was given information about Care Coordination services today including:   The Care Coordination services include support from the care team which includes your Nurse Coordinator, Clinical Social Worker, or Pharmacist.  The Care Coordination team is here to help remove barriers to the health concerns and goals most important to you. Care Coordination services are voluntary, and the patient may decline or stop services at any time by request to their care team member.   Care Coordination Consent Status: Patient agreed to services and verbal consent obtained.   Follow up plan:  Telephone appointment with care coordination team member scheduled for:  11/29/2022  Encounter Outcome:  Pt. Scheduled from referral   Burman Nieves, [facility] Care Coordination Care Guide Direct Dial: [phone]

## 2022-11-23 NOTE — Progress Notes (Signed)
  Care Coordination  Outreach Note  11/23/2022 Name: Keyion Knack MRN: [id number] DOB: 1950/08/22   Care Coordination Outreach Attempts: An unsuccessful telephone outreach was attempted today to offer the patient information about available care coordination services.  Follow Up Plan:  Additional outreach attempts will be made to offer the patient care coordination information and services.   Encounter Outcome:  Pt. Request to Call Back  Burman Nieves, [facility] Care Coordination Care Guide Direct Dial: [phone]

## 2022-11-26 ENCOUNTER — Telehealth: Payer: Self-pay

## 2022-11-26 NOTE — Progress Notes (Signed)
   Care Guide Note  11/26/2022 Name: Calvin Escobar MRN: [id number] DOB: [birth year]  Referred by: [name], MD Reason for referral : Care Coordination (Outreach to schedule with pharm d )   Calvin Escobar is a 72 y.o. year old male who is a primary care patient of [name], MD. Calvin Escobar was referred to the pharmacist for assistance related to HTN.    Successful contact was made with the patient to discuss pharmacy services including being ready for the pharmacist to call at least 5 minutes before the scheduled appointment time, to have medication bottles and any blood sugar or blood pressure readings ready for review. The patient agreed to meet with the pharmacist via with the pharmacist via telephone visit on (date/time).  11/30/2022  Penne Lash, RMA Care Guide [facility]  [place], [place] [address] Direct Dial: [phone] [email or web address]

## 2022-11-29 ENCOUNTER — Ambulatory Visit: Payer: Self-pay | Admitting: Licensed Clinical Social Worker

## 2022-11-30 ENCOUNTER — Other Ambulatory Visit: Payer: Medicare PPO | Admitting: Pharmacist

## 2022-11-30 NOTE — Patient Outreach (Signed)
  Care Coordination   Initial Visit Note   11/29/2022 Name: Calvin Escobar MRN: [id number] DOB: 07-27-1950  Calvin Escobar is a 72 y.o. year old male who sees Calvin Olszewski, MD for primary care. I spoke with  Calvin Escobar by phone today.  What matters to the patients health and wellness today?  Management of MH symptoms    Goals Addressed             This Visit's Progress    Obtain Supportive Resources-Counselor   On track    Activities and task to complete in order to accomplish goals.   Keep all upcoming appointments discussed today Continue with compliance of taking medication prescribed by Doctor Implement healthy coping skills discussed to assist with management of symptoms Review counseling resources provided and/or discussed      Patient Stated          SDOH assessments and interventions completed:  Yes  SDOH Interventions Today    Flowsheet Row Most Recent Value  SDOH Interventions   Food Insecurity Interventions Intervention Not Indicated  Housing Interventions Intervention Not Indicated  Transportation Interventions Intervention Not Indicated        Care Coordination Interventions:  Yes, provided  Interventions Today    Flowsheet Row Most Recent Value  Chronic Disease   Chronic disease during today's visit Hypertension (HTN), Other  [Bipolar Affective Disorder, Insomnia]  General Interventions   General Interventions Discussed/Reviewed General Interventions Discussed, Programmer, applications, Doctor Visits  Doctor Visits Discussed/Reviewed Doctor Visits Reviewed  Mental Health Interventions   Mental Health Discussed/Reviewed Mental Health Discussed, Coping Strategies, Depression, Anxiety  Nutrition Interventions   Nutrition Discussed/Reviewed Nutrition Discussed  Pharmacy Interventions   Pharmacy Dicussed/Reviewed Pharmacy Topics Discussed, Medication Adherence  Safety Interventions   Safety Discussed/Reviewed Safety Discussed       Follow up plan:  Follow up call scheduled for 2-4 weeks    Encounter Outcome:  Pt. Visit Completed   Jenel Lucks, MSW, LCSW [facility] Care Management [facility]  [email or web address] Phone [phone] 6:32 AM

## 2022-11-30 NOTE — Patient Instructions (Signed)
Visit Information  Thank you for taking time to visit with me today. Please don't hesitate to contact me if I can be of assistance to you.   Following are the goals we discussed today:   Goals Addressed             This Visit's Progress    Obtain Supportive Resources-Counselor   On track    Activities and task to complete in order to accomplish goals.   Keep all upcoming appointments discussed today Continue with compliance of taking medication prescribed by Doctor Implement healthy coping skills discussed to assist with management of symptoms Review counseling resources provided and/or discussed      Patient Stated          Our next appointment is by telephone on 07/18 at 11:30 AM  Please call the care guide team at [phone] if you need to cancel or reschedule your appointment.   If you are experiencing a Mental Health or Behavioral Health Crisis or need someone to talk to, please call the Suicide and Crisis Lifeline: 988 call 911   Patient verbalizes understanding of instructions and care plan provided today and agrees to view in MyChart. Active MyChart status and patient understanding of how to access instructions and care plan via MyChart confirmed with patient.     Jenel Lucks, MSW, LCSW [facility] Care Management [place]  [facility] [email or web address] Phone [phone] 6:32 AM
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
## 2022-12-13 DIAGNOSIS — F1011 Alcohol abuse, in remission: Secondary | ICD-10-CM | POA: Diagnosis not present

## 2022-12-13 DIAGNOSIS — F413 Other mixed anxiety disorders: Secondary | ICD-10-CM | POA: Diagnosis not present

## 2022-12-13 DIAGNOSIS — F332 Major depressive disorder, recurrent severe without psychotic features: Secondary | ICD-10-CM | POA: Diagnosis not present

## 2022-12-19 ENCOUNTER — Ambulatory Visit: Payer: Medicare HMO | Admitting: Internal Medicine

## 2022-12-19 ENCOUNTER — Encounter: Payer: Self-pay | Admitting: Internal Medicine

## 2022-12-19 VITALS — BP 114/90 | HR 82 | Temp 98.1°F | Ht 72.0 in | Wt 265.2 lb

## 2022-12-19 DIAGNOSIS — F5101 Primary insomnia: Secondary | ICD-10-CM

## 2022-12-19 DIAGNOSIS — E7800 Pure hypercholesterolemia, unspecified: Secondary | ICD-10-CM

## 2022-12-19 DIAGNOSIS — I5022 Chronic systolic (congestive) heart failure: Secondary | ICD-10-CM

## 2022-12-19 DIAGNOSIS — I10 Essential (primary) hypertension: Secondary | ICD-10-CM | POA: Diagnosis not present

## 2022-12-19 DIAGNOSIS — N401 Enlarged prostate with lower urinary tract symptoms: Secondary | ICD-10-CM | POA: Diagnosis not present

## 2022-12-19 DIAGNOSIS — M549 Dorsalgia, unspecified: Secondary | ICD-10-CM

## 2022-12-19 DIAGNOSIS — I420 Dilated cardiomyopathy: Secondary | ICD-10-CM

## 2022-12-19 DIAGNOSIS — N62 Hypertrophy of breast: Secondary | ICD-10-CM

## 2022-12-19 DIAGNOSIS — F3131 Bipolar disorder, current episode depressed, mild: Secondary | ICD-10-CM | POA: Diagnosis not present

## 2022-12-19 DIAGNOSIS — N184 Chronic kidney disease, stage 4 (severe): Secondary | ICD-10-CM | POA: Insufficient documentation

## 2022-12-19 DIAGNOSIS — R2689 Other abnormalities of gait and mobility: Secondary | ICD-10-CM | POA: Diagnosis not present

## 2022-12-19 DIAGNOSIS — G8929 Other chronic pain: Secondary | ICD-10-CM | POA: Diagnosis not present

## 2022-12-19 MED ORDER — PRAVASTATIN SODIUM 20 MG PO TABS
20.0000 mg | ORAL_TABLET | Freq: Every day | ORAL | 3 refills | Status: DC
Start: 2022-12-19 — End: 2024-01-29

## 2022-12-19 MED ORDER — CARVEDILOL 12.5 MG PO TABS
12.5000 mg | ORAL_TABLET | Freq: Two times a day (BID) | ORAL | 3 refills | Status: DC
Start: 2022-12-19 — End: 2023-06-11

## 2022-12-19 MED ORDER — LOSARTAN POTASSIUM 50 MG PO TABS
50.0000 mg | ORAL_TABLET | Freq: Every day | ORAL | 3 refills | Status: DC
Start: 2022-12-19 — End: 2023-06-11

## 2022-12-19 NOTE — Assessment & Plan Note (Addendum)
Psychiatric Management: Recently transitioned to Dr. [name] for psychiatric care, she is on a regimen including Trazodone 100-300mg  at bedtime, Effexor 150mg  daily, Divalproex 250mg  daily, and Buspirone 5mg  three times daily, with the recent addition of Buspirone showing no noticeable effect yet. She engages with [facility] for peer support and mental health classes. The plan is to continue the current psychiatric regimen, consider increasing Divalproex dose to 500mg  daily after discussing with Dr. [name], explore Transcranial Magnetic Stimulation (TMS) if affordable, and follow up with Dr. [name] on 12/27/2022.He asks about TMS , I encourage if affordable Encouraged patient to continue(s) with new psychiatry and buspar

## 2022-12-19 NOTE — Assessment & Plan Note (Signed)
Not clear if on pravastatin, I sent refills.   Improving Your Cholesterol: Diet: Focus on a Mediterranean-style diet, limit saturated fats and sugars, and increase omega-3 fatty acids (fish, flaxseeds,nuts,extra virgin olive oil, avocados). Exercise: Engage in regular physical activity (aerobic exercises are particularly beneficial for HDL). Weight Management: Maintain a healthy weight. Smoking Cessation: Quitting smoking improves cholesterol levels.

## 2022-12-19 NOTE — Assessment & Plan Note (Signed)
has a history of heart failure and was last seen by a cardiologist in 2022. We will refer back to Dr. Carolanne Grumbling for cardiac follow-up and possible echocardiogram.

## 2022-12-19 NOTE — Assessment & Plan Note (Addendum)
Hypertension: is being treated with Losartan and Carvedilol, which will be continued.

## 2022-12-19 NOTE — Progress Notes (Signed)
[facility]: [phone]   Routine Medical Office Visit  Patient:  Calvin Escobar      Age: 72 y.o.       Sex:  male  Date:   12/19/2022 Patient Care Team: [name], MD as PCP - General (Internal Medicine) Quintella Reichert, MD as PCP - Cardiology (Cardiology) Nita Sells, MD (Dermatology) [facility], [facility], [facility] (Behavioral Health) [facility] [place] (Psychiatry) Today's Healthcare Provider: [name], MD   Assessment and Plan:   Chetan was seen today for 1 month follow-up.  Pure hypercholesterolemia Overview: Medications: not yet discussed Lab Results  Component Value Date   HDL 40.80 08/29/2022   CHOLHDL 3 08/29/2022   Lab Results  Component Value Date   LDLCALC 69 08/29/2022   Lab Results  Component Value Date   TRIG 138.0 08/29/2022   Lab Results  Component Value Date   CHOL 138 08/29/2022   The 10-year ASCVD risk score (Arnett DK, et al., 2019) is: 16.6%   Values used to calculate the score:     Age: 72 years     Sex: Male     Is Non-Hispanic African American: No     Diabetic: No     Tobacco smoker: No     Systolic Blood Pressure: 112 mmHg     Is BP treated: No     HDL Cholesterol: 40.8 mg/dL     Total Cholesterol: 138 mg/dL Lab Results  Component Value Date   ALT 13 08/29/2022   AST 16 08/29/2022   ALKPHOS 71 08/29/2022   TSH 1.42 08/29/2022   Body mass index is 36.24 kg/m.  Lipoprotein(a), Apolipoprotein B (ApoB), and High-sensitivity C-reactive protein (hs-CRP) No results found for: "HSCRP", "LIPOA"  Assessment & Plan: Not clear if on pravastatin, I sent refills.   Improving Your Cholesterol: Diet: Focus on a Mediterranean-style diet, limit saturated fats and sugars, and increase omega-3 fatty acids (fish, flaxseeds,nuts,extra virgin olive oil, avocados). Exercise: Engage in regular physical activity (aerobic exercises are particularly beneficial for HDL). Weight  Management: Maintain a healthy weight. Smoking Cessation: Quitting smoking improves cholesterol levels.  Orders: -     Pravastatin Sodium; Take 1 tablet (20 mg total) by mouth at bedtime.  Dispense: 90 tablet; Refill: 3  Primary hypertension Assessment & Plan: Hypertension: is being treated with Losartan and Carvedilol, which will be continued.  Orders: -     Losartan Potassium; Take 1 tablet (50 mg total) by mouth daily. Replaces entresto, start after done with entresto  Dispense: 90 tablet; Refill: 3 -     Carvedilol; Take 1 tablet (12.5 mg total) by mouth 2 (two) times daily with a meal.  Dispense: 180 tablet; Refill: 3  Bipolar affective disorder, currently depressed, mild (HCC) Overview: Now following with [facility] psychiatry, trying increased buspirone Current Medications:   zolpidem 10 mg daily trazodone 100 mg twice daily venlafaxine 150 mg daily lorazepam 1 mg twice 3 times daily 1 in the morning 2 at bedtime History of lithium toxicity -currently not on mood stabilizer  Noted history of bipolar 1 in chart. And self endorses the diagnosis  Following with [facility]/classes  Following [facility]  Used to following with [facility] with Dr. Lily Peer but felt it wasn't for him. Following with peer support [name] who manages 2 of the classes he goes to. 3-4 classes a week.  anxiety not well controlled long term  Assessment & Plan: Psychiatric Management: Recently transitioned to Dr. [name] for psychiatric care, she is on a regimen including Trazodone 100-300mg  at bedtime, Effexor 150mg  daily, Divalproex 250mg  daily, and Buspirone 5mg  three times daily, with the recent addition of Buspirone showing no noticeable effect yet. She engages with [facility] for peer support and mental health classes. The plan is to continue the current psychiatric regimen, consider increasing Divalproex dose to 500mg  daily after discussing with  Dr. [name], explore Transcranial Magnetic Stimulation (TMS) if affordable, and follow up with Dr. [name] on 12/27/2022.He asks about TMS , I encourage if affordable Encouraged patient to continue(s) with new psychiatry and buspar    Benign prostatic hyperplasia with urinary obstruction Overview: Dribbling/nocturia wears diapers or special underwear It gets less wet as long as he stays on Flomax/tamsulosin No results found for: "PSA1", "PSA"  >>OVERVIEW FOR URINARY DRIBBLING WRITTEN ON 05/14/2022  1:39 PM BY [name], MD  Requires diapers and special underwear that has been getting less wet with flomax Causes odor.     Primary insomnia Overview: On ativan, trazodone 300 Off Ambien Managed by psychiatry    Chronic systolic heart failure (HCC) Overview: EF 25-30% jan 2021   Orders: -     Ambulatory referral to Cardiology  DCM (dilated cardiomyopathy) (HCC) Overview: nonischemic with normal coronary arteries at cath 05/2019 Ef 25% 2021-> 40% 04/2021 Following with cardiology Dr. Mayford Knife   Assessment & Plan: has a history of heart failure and was last seen by a cardiologist in 2022. We will refer back to Dr. Carolanne Grumbling for cardiac follow-up and possible echocardiogram.  Orders: -     Ambulatory referral to Cardiology  Gynecomastia, male  Limp Overview: Noted he walks with limp and right leg out turned but he doesn't know why. Maybe his back?  Reports its over 10 years this way   Chronic back pain, unspecified back location, unspecified back pain laterality Overview:  has had chronic back pain for decades with unclear etiology. \       Follow-up:  return in 1-3 months with the current provider, sooner if needed, and bring all medications to the next appointment for review.      Future Appointments  Date Time Provider Department Center  12/20/2022 11:30 AM Jenel Lucks D, LCSW [facility] None  12/25/2022 10:30 AM Henrene Pastor, RPH-CPP [facility] None   03/07/2023  1:20 PM [name], MD [facility]  05/17/2023  1:00 PM [facility] ANNUAL WELLNESS VISIT 1 [facility]           Clinical Presentation:    72 y.o. male who has Bipolar affective disorder, depressed (HCC); Chronic bronchitis (HCC); Dementia (HCC); Hyperlipidemia; Hyperkalemia; DCM (dilated cardiomyopathy) (HCC); OSA treated with BiPAP; Chronic systolic heart failure (HCC); Ascending aorta dilatation (HCC); Alcoholism in remission (HCC); Benign prostatic hyperplasia with urinary obstruction; PAC (premature atrial contraction); Primary insomnia; PUD (peptic ulcer disease); Generalized arthritis; Morbid obesity (HCC); Urinary incontinence; Hypertension; Limp; Gynecomastia, male; and Chronic back pain on their problem list. His reasons/main concerns/chief complaints for today's office visit are 1 month follow-up (Psychiatrist suggested TMS therapy, would like to discuss.)   AI-Extracted: Discussed the use of AI scribe software for clinical note transcription with the patient, who gave verbal consent to proceed.  History of Present Illness   The patient, with a history of psychiatric illness, has recently transitioned to a new psychiatrist, Dr. [name]. reports dissatisfaction with previous psychiatrist, Dr. Lily Peer, and feels hopeful about new psychiatric care. The patient  has recently started on Buspirone 5mg  three times a day, as prescribed by Dr. [name], and has been taking it consistently since the initial appointment. has not yet noticed any significant changes in anxiety levels, but understands that it may take a few weeks to see an effect.  In addition to Buspirone, the patient is also taking Trazodone 100-300mg  at bedtime, Effexor 150mg  daily, and Depakote 250mg  daily. he reports that sleep aids are working well and she is adhering to medication regimen.  The patient has been actively participating in classes with the [facility] and finds them beneficial.  is also expecting  calls from two social workers, Jenel Lucks and [place], who are part of care team and are working to lower costs of living and medication expenses.  The patient has a history of heart failure and has not seen a cardiologist in approximately two years. also reports a long-standing limp, possibly related to chronic back pain. The patient is considering Transcranial Magnetic Stimulation (TMS) for psychiatric symptoms, but is concerned about the cost.  The patient's mood appears to be significantly affected by current events, reports feeling angry and frustrated with the news.        Reviewed chart data: Past Medical History:  Diagnosis Date   Allergy seasonal   Ascending aorta dilatation (HCC)    38 mm by 2D echo 04/2021   Cancer (HCC) skin   DCM (dilated cardiomyopathy) (HCC)    nonischemic with normal coronary arteries at cath 06/2019.  EF 35 to 40% on echo 04/2021   Dementia (HCC)    Depression    Hyperlipidemia    Lithium toxicity 01/29/2016   OSA treated with BiPAP    SKIN CANCER, HX OF 05/10/2007   Facial left check and forehead and r shoulder  F/w derm   Urinary dribbling 03/14/2022    Outpatient Medications Prior to Visit  Medication Sig   busPIRone (BUSPAR) 5 MG tablet Take 5 mg by mouth 3 (three) times daily.   calcium-vitamin D (OSCAL WITH D) 500-5 MG-MCG tablet Take 1 tablet by mouth daily with breakfast.   Cariprazine HCl (VRAYLAR) 1.5 & 3 MG CPPK Take 1 tablet by mouth daily at 6 (six) AM. Take by mouth as directed. Day 1-Start at 1.5 mg/day.  After Day 15-May increase dose to 3 mg/day long term   divalproex (DEPAKOTE ER) 250 MG 24 hr tablet Take 250 mg by mouth at bedtime.   tamsulosin (FLOMAX) 0.4 MG CAPS capsule TAKE 1 CAPSULE BY MOUTH EVERY DAY   traZODone (DESYREL) 100 MG tablet Take 300 mg by mouth at bedtime.   venlafaxine XR (EFFEXOR-XR) 150 MG 24 hr capsule Take 150 mg by mouth daily with breakfast.   [DISCONTINUED] carvedilol (COREG) 12.5 MG tablet Take 1  tablet (12.5 mg total) by mouth 2 (two) times daily with a meal.   [DISCONTINUED] LORazepam (ATIVAN) 1 MG tablet 1 tablet by mouth every morning and 2 tablets by mouth at bedtime   [DISCONTINUED] losartan (COZAAR) 50 MG tablet Take 1 tablet (50 mg total) by mouth daily. Replaces entresto, start after done with entresto   [DISCONTINUED] pravastatin (PRAVACHOL) 20 MG tablet Take 20 mg by mouth at bedtime.    No facility-administered medications prior to visit.         Clinical Data Analysis:   Physical Exam  BP (!) 114/90 (BP Location: Left Arm, Patient Position: Sitting)   Pulse 82   Temp 98.1 F (36.7 C) (Temporal)   Ht 6' (1.829  m)   Wt 265 lb 3.2 oz (120.3 kg)   SpO2 96%   BMI 35.97 kg/m  Wt Readings from Last 10 Encounters:  12/19/22 265 lb 3.2 oz (120.3 kg)  11/21/22 267 lb 3.2 oz (121.2 kg)  07/18/22 279 lb 9.6 oz (126.8 kg)  05/14/22 282 lb 3.2 oz (128 kg)  05/04/22 282 lb 12.8 oz (128.3 kg)  03/14/22 280 lb 3.2 oz (127.1 kg)  04/18/21 275 lb (124.7 kg)  04/14/21 277 lb (125.6 kg)  09/09/20 284 lb 9.6 oz (129.1 kg)  02/19/20 273 lb (123.8 kg)   Vital signs reviewed.  Nursing notes reviewed. Weight trend reviewed. Abnormalities and Problem-Specific physical exam findings:  depressed, friendly  General Appearance:  No acute distress appreciable.   Well-groomed, healthy-appearing male.  Well proportioned with no abnormal fat distribution.  Good muscle tone. Skin: Clear and well-hydrated. Pulmonary:  Normal work of breathing at rest, no respiratory distress apparent. SpO2: 96 %  Musculoskeletal: All extremities are intact.  Neurological:  Awake, alert, oriented, and engaged.  No obvious focal neurological deficits or cognitive impairments.  Sensorium seems unclouded.   Speech is clear and coherent with logical content. Psychiatric:  Appropriate mood, pleasant and cooperative demeanor, thoughtful and engaged during the exam  Results Reviewed:      No results found for  any visits on 12/19/22.  Office Visit on 11/21/2022  Component Date Value   Sodium 11/21/2022 141    Potassium 11/21/2022 4.5    Chloride 11/21/2022 102    CO2 11/21/2022 31    Glucose, Bld 11/21/2022 106 (H)    BUN 11/21/2022 16    Creatinine, Ser 11/21/2022 1.12    GFR 11/21/2022 64.95    Calcium 11/21/2022 9.2    Vitamin B-12 11/21/2022 305    Folate 11/21/2022 >23.8    TSH 11/21/2022 0.968    Hgb A1c MFr Bld 11/21/2022 5.4   Lab on 08/29/2022  Component Date Value   Valproic Acid Lvl 08/29/2022 29.3 (L)    TSH 08/29/2022 1.42    HIV 1&2 Ab, 4th Generati* 08/29/2022 NON-REACTIVE    Hepatitis C Ab 08/29/2022 NON-REACTIVE    Cholesterol 08/29/2022 138    Triglycerides 08/29/2022 138.0    HDL 08/29/2022 40.80    VLDL 08/29/2022 27.6    LDL Cholesterol 08/29/2022 69    Total CHOL/HDL Ratio 08/29/2022 3    NonHDL 08/29/2022 96.89    Sodium 08/29/2022 140    Potassium 08/29/2022 5.4 No hemolysis seen (H)    Chloride 08/29/2022 101    CO2 08/29/2022 31    Glucose, Bld 08/29/2022 107 (H)    BUN 08/29/2022 20    Creatinine, Ser 08/29/2022 1.14    Total Bilirubin 08/29/2022 0.5    Alkaline Phosphatase 08/29/2022 71    AST 08/29/2022 16    ALT 08/29/2022 13    Total Protein 08/29/2022 7.3    Albumin 08/29/2022 4.3    GFR 08/29/2022 63.69    Calcium 08/29/2022 9.3    WBC 08/29/2022 7.1    RBC 08/29/2022 4.62    Hemoglobin 08/29/2022 13.7    HCT 08/29/2022 41.4    MCV 08/29/2022 89.5    MCHC 08/29/2022 33.2    RDW 08/29/2022 14.5    Platelets 08/29/2022 170.0    Neutrophils Relative % 08/29/2022 69.3    Lymphocytes Relative 08/29/2022 20.9    Monocytes Relative 08/29/2022 6.9    Eosinophils Relative 08/29/2022 2.1    Basophils Relative 08/29/2022 0.8    Neutro  Abs 08/29/2022 4.9    Lymphs Abs 08/29/2022 1.5    Monocytes Absolute 08/29/2022 0.5    Eosinophils Absolute 08/29/2022 0.2    Basophils Absolute 08/29/2022 0.1    No image results found.   No results  found.     This encounter employed real-time, collaborative documentation. The patient actively reviewed and updated their medical record on a shared screen, ensuring transparency and facilitating joint problem-solving for the problem list, overview, and plan. This approach promotes accurate, informed care. The treatment plan was discussed and reviewed in detail, including medication safety, potential side effects, and all patient questions. We confirmed understanding and comfort with the plan. Follow-up instructions were established, including contacting the office for any concerns, returning if symptoms worsen, persist, or new symptoms develop, and precautions for potential emergency department visits. ----------------------------------------------------- [name], MD  12/19/2022 4:23 PM  [facility]:  [phone]

## 2022-12-20 ENCOUNTER — Ambulatory Visit: Payer: Self-pay | Admitting: Licensed Clinical Social Worker

## 2022-12-20 NOTE — Patient Outreach (Signed)
  Care Coordination   Follow Up Visit Note   12/20/2022 Name: Calvin Escobar MRN: [id number] DOB: 1950-06-05  Calvin Escobar is a 72 y.o. year old male who sees [name], MD for primary care. I spoke with  [name] by phone today.  What matters to the patients health and wellness today?  Symptom Management    Goals Addressed             This Visit's Progress    Obtain Supportive Resources-Counselor   On track    Activities and task to complete in order to accomplish goals.   Keep all upcoming appointments discussed today Continue with compliance of taking medication prescribed by Doctor Implement healthy coping skills discussed to assist with management of symptoms Review counseling resources provided and/or discussed. F/up with Psychiatrist if you experience any adverse side effects         SDOH assessments and interventions completed:  No     Care Coordination Interventions:  Yes, provided  Interventions Today    Flowsheet Row Most Recent Value  Chronic Disease   Chronic disease during today's visit Hypertension (HTN), Other  [Bipolar, Dementia, Chronic Back Pain]  General Interventions   General Interventions Discussed/Reviewed General Interventions Reviewed, Doctor Visits  [Sleep hygiene has improved]  Doctor Visits Discussed/Reviewed Doctor Visits Reviewed  Mental Health Interventions   Mental Health Discussed/Reviewed Mental Health Reviewed, Coping Strategies, Anxiety, Depression  [Pt has initiated a new med and will follow up with Dr. [name] (Psychiatrist) on 07/25. LCSW provided environment for pt to utilize reflection and process tx plans to assist with chronic health conditions. Pt strengths highlighted and encouragement provided]  Nutrition Interventions   Nutrition Discussed/Reviewed Nutrition Reviewed  Pharmacy Interventions   Pharmacy Dicussed/Reviewed Pharmacy Topics Reviewed, Medication Adherence  [Strategies to enhance med compliance discussed]   Safety Interventions   Safety Discussed/Reviewed Safety Reviewed       Follow up plan: Follow up call scheduled for 08/01    Encounter Outcome:  Pt. Visit Completed   Jenel Lucks, MSW, LCSW [facility] Care Management [facility]  [facility] [email or web address] Phone [phone] 2:23 PM

## 2022-12-20 NOTE — Patient Instructions (Signed)
Visit Information  Thank you for taking time to visit with me today. Please don't hesitate to contact me if I can be of assistance to you.   Following are the goals we discussed today:   Goals Addressed             This Visit's Progress    Obtain Supportive Resources-Counselor   On track    Activities and task to complete in order to accomplish goals.   Keep all upcoming appointments discussed today Continue with compliance of taking medication prescribed by Doctor Implement healthy coping skills discussed to assist with management of symptoms Review counseling resources provided and/or discussed. F/up with Psychiatrist if you experience any adverse side effects         Our next appointment is by telephone on 08/01 at 11:30 AM  Please call the care guide team at [phone] if you need to cancel or reschedule your appointment.   If you are experiencing a Mental Health or Behavioral Health Crisis or need someone to talk to, please call the Suicide and Crisis Lifeline: 988 call 911   Patient verbalizes understanding of instructions and care plan provided today and agrees to view in MyChart. Active MyChart status and patient understanding of how to access instructions and care plan via MyChart confirmed with patient.     Jenel Lucks, MSW, LCSW [facility] Care Management [place]  [facility] [email or web address] Phone [phone] 2:23 PM

## 2022-12-25 ENCOUNTER — Other Ambulatory Visit: Payer: Medicare PPO | Admitting: Pharmacist

## 2022-12-25 NOTE — Progress Notes (Signed)
12/25/2022 Name: Calvin Escobar MRN: [id number] DOB: 07-Jul-1950  Chief Complaint  Patient presents with   Medication Management    Follow up    Calvin Escobar is a 72 y.o. year old male who presented for a telephone visit.   They were referred to the pharmacist by their PCP for assistance in managing medication access and complex medication management.    Subjective:  Care Team: Primary Care Provider: [name], MD ; Next Scheduled Visit: 03/07/2023 Psychiatrist: Dr Neila Gear; Next Scheduled Visit: 12/27/2022  Medication Access/Adherence  Current Pharmacy:  [id number] - [place], [place] - [address] [place] [address] Phone: [phone] Fax: [phone]   Patient reports affordability concerns with their medications: Yes  - He never started Vraylar - possibly due to cost but patient did not recall why he didn't start.  Patient reports access/transportation concerns to their pharmacy: No  - he states he is currently driving but may have to stop soon due to vision changes.  Patient reports adherence concerns with their medications:  No  - wife is helping with medication administration   Hyperlipidemia/ASCVD Risk Reduction  Current lipid lowering medications: pravastatin 20mg  daily  Medications tried in the past:   Antiplatelet regimen: none  ASCVD History: none Family History: no Risk Factors: obesity, CHF,   CHF / Dilated Cardiomyopathy: Managed by Dr Eliott Nine  Current medications:  ACEi/ARB/ARNI: losartan SGLT2i: no Beta blocker: carvedilol  Mineralocorticoid Receptor Antagonist: no  Diuretic regimen: none  Took Entresto in past but was changed to losartan 12/2021  Patient denies volume overload signs or symptoms including no shortness of breath, lower extremity edema, increased use of pillows at night   Obesity/Overweight Comorbidities: urinary incontinence, cardiomyopathy, elevated HDL  Current medications:  none  Weight Management treatments previously prescribed:  Ozempic but not covered by his insurance / Medicare plan.   He does not currently meet criteria for [facility] for decreasing CVD in patients with past history of MI, stroke or PAD.   Anxiety / Bipolar / insomnia:  Current medications:  Divalproex 250mg  - directions are to take 250mg  daily for 1 week, then increase to 500mg  daily. Patient has been taking only 250mg  daily for last 2 months.  Lorazepam 1mg  - take 1 tablet each morning and 2 tablets each evening at bedtime.  Trazodone 100mg  - take 3 tablets = 300mg  daily at bedtime Venlafaxine 225mg  once a day at breakfast.  Buspirone 5mg  - take 1 tablet 3 times a day (Started 12/13/2022)  Prescribed Vraylar - never started; needed prior authorization and even after prior authorization copay was $99/month  Medications tried in the past: sertraline, paroxetine and escitalopram Also took olanzapine / Zyprexa - 03/2012 to 02/2016 and aripiprazole 2mg  from 2020 to 2022  Behavioral Health support:  Psychiatrist is now Dr Neila Gear - has only seen him once.  Also attending classes at [facility]. Was going 3 or 4 times per week but some of the classes he was attending have stopped. He is going only 1 ot 2 times per week now. He feels like these classes have been very helpful.   Current medication access support: none   Objective:  Lab Results  Component Value Date   HGBA1C 5.4 11/21/2022    Lab Results  Component Value Date   CREATININE 1.12 11/21/2022   BUN 16 11/21/2022   NA 141 11/21/2022   K 4.5 11/21/2022   CL 102 11/21/2022   CO2 31 11/21/2022  Lab Results  Component Value Date   CHOL 138 08/29/2022   HDL 40.80 08/29/2022   LDLCALC 69 08/29/2022   TRIG 138.0 08/29/2022   CHOLHDL 3 08/29/2022    Medications Reviewed Today     Reviewed by Donzetta Starch, CMA (Certified Medical Assistant) on 12/19/22 at 1351  Med List Status: <None>   Medication  Order Taking? Sig Documenting Provider Last Dose Status Informant  busPIRone (BUSPAR) 5 MG tablet [id number] Yes Take 5 mg by mouth 3 (three) times daily. [provider] Taking Active   calcium-vitamin D ([place]CAL WITH D) 500-5 MG-MCG tablet [id number] Yes Take 1 tablet by mouth daily with breakfast. [provider] Taking Active   Cariprazine HCl (VRAYLAR) 1.5 & 3 MG CPPK [id number] Yes Take 1 tablet by mouth daily at 6 (six) AM. Take by mouth as directed. Day 1-Start at 1.5 mg/day.  After Day 15-May increase dose to 3 mg/day long term [name], MD Taking Active   carvedilol (COREG) 12.5 MG tablet [id number] Yes Take 1 tablet (12.5 mg total) by mouth 2 (two) times daily with a meal. Turner, Cornelious Bryant, MD Taking Active   divalproex (DEPAKOTE ER) 250 MG 24 hr tablet [id number] Yes Take 250 mg by mouth at bedtime. [provider] Taking Active            Med Note Clydie Braun, [place] B   Fri Nov 30, 2022 10:59 AM) Per instructions on prescription bottle - 250mg  daily for 1 week, then increase to 500mg  daily but pt is still only taking 250mg     LORazepam (ATIVAN) 1 MG tablet [id number] Yes 1 tablet by mouth every morning and 2 tablets by mouth at bedtime [provider] Taking Active   losartan (COZAAR) 50 MG tablet [id number] Yes Take 1 tablet (50 mg total) by mouth daily. Replaces entresto, start after done with Vara Guardian, MD Taking Active   pravastatin (PRAVACHOL) 20 MG tablet [id number] Yes Take 20 mg by mouth at bedtime.  [provider] Taking Active Spouse/Significant Other  tamsulosin (FLOMAX) 0.4 MG CAPS capsule [id number] Yes TAKE 1 CAPSULE BY MOUTH EVERY DAY [name], MD Taking Active   traZODone (DESYREL) 100 MG tablet [id number] Yes Take 300 mg by mouth at bedtime. [provider] Taking Active   venlafaxine XR (EFFEXOR-XR) 150 MG 24 hr capsule [id number] Yes Take 150 mg by mouth daily with breakfast. [provider]  Taking Active               Assessment/Plan:   Hyperlipidemia/ASCVD Risk Reduction: Last LDL at goal of < 70 - Recommend to continue pravastatin 20mg  daily - reviewed refill history. Patient has filled on time so far in 2024.    CHF / Dilated Cardiomyopathy:  - continue to follow up with Dr Mayford Knife / cardiology - will see prior authorization Georges Mouse 02/25/2023 - Continue losartan and carvedilol  - continue to monitor potassium - if chronically elevated might need to consider lower dose of losartan.   Bipolar / Insomnia / Depression/Anxiety: anxiety improved a little with addition of buspirone.   - continue classes at [facility] and follow up with psychiatrist.   - Recommended patient discuss dose of divalproex with Dr Neila Gear   Follow Up Plan: 1 to 2 months  Henrene Pastor, PharmD Clinical Pharmacist [facility]  Population Health

## 2022-12-27 DIAGNOSIS — F1011 Alcohol abuse, in remission: Secondary | ICD-10-CM | POA: Diagnosis not present

## 2022-12-27 DIAGNOSIS — F332 Major depressive disorder, recurrent severe without psychotic features: Secondary | ICD-10-CM | POA: Diagnosis not present

## 2022-12-27 DIAGNOSIS — F413 Other mixed anxiety disorders: Secondary | ICD-10-CM | POA: Diagnosis not present

## 2023-01-03 ENCOUNTER — Ambulatory Visit: Payer: Self-pay | Admitting: Licensed Clinical Social Worker

## 2023-01-04 NOTE — Patient Outreach (Signed)
  Care Coordination   Follow Up Visit Note   01/03/2023 Name: Calvin Escobar MRN: [id number] DOB: February 20, 1951  Calvin Escobar is a 72 y.o. year old male who sees Calvin Olszewski, MD for primary care. I spoke with  Calvin Escobar by phone today.  What matters to the patients health and wellness today?  Symptom Management    Goals Addressed             This Visit's Progress    Obtain Supportive Resources-Counselor   On track    Activities and task to complete in order to accomplish goals.   Keep all upcoming appointments discussed today Continue with compliance of taking medication prescribed by Doctor Implement healthy coping skills discussed to assist with management of symptoms Review counseling resources provided and/or discussed. F/up with Psychiatrist if you experience any adverse side effects         SDOH assessments and interventions completed:  No     Care Coordination Interventions:  Yes, provided  Interventions Today    Flowsheet Row Most Recent Value  Chronic Disease   Chronic disease during today's visit Hypertension (HTN), Other  [Dementia, Insomnia, Bipolar Disorder, Chronic Pain]  General Interventions   General Interventions Discussed/Reviewed General Interventions Reviewed, Doctor Visits  Doctor Visits Discussed/Reviewed Doctor Visits Reviewed  [facility] psych appt this month and will discuss TMS modality.]  Mental Health Interventions   Mental Health Discussed/Reviewed Mental Health Reviewed, Coping Strategies, Anxiety, Depression  [Endorses lapses in anxiety and crying. Slight decrease in symptoms. Group starts again 08/08 and peer support every 2 weeks. Discussed balancing pressure and having healthy expectations to decrease negative emotions against self.]  Nutrition Interventions   Nutrition Discussed/Reviewed Nutrition Reviewed  Pharmacy Interventions   Pharmacy Dicussed/Reviewed Pharmacy Topics Reviewed, Medication Adherence  Safety Interventions   Safety  Discussed/Reviewed Safety Reviewed       Follow up plan: Follow up call scheduled for 4 weeks    Encounter Outcome:  Pt. Visit Completed   Jenel Lucks, MSW, LCSW [facility] Care Management [facility]  [facility] [email or web address] Phone [phone] 6:40 AM

## 2023-01-04 NOTE — Patient Instructions (Signed)
Visit Information  Thank you for taking time to visit with me today. Please don't hesitate to contact me if I can be of assistance to you.   Following are the goals we discussed today:   Goals Addressed             This Visit's Progress    Obtain Supportive Resources-Counselor   On track    Activities and task to complete in order to accomplish goals.   Keep all upcoming appointments discussed today Continue with compliance of taking medication prescribed by Doctor Implement healthy coping skills discussed to assist with management of symptoms Review counseling resources provided and/or discussed. F/up with Psychiatrist if you experience any adverse side effects         Our next appointment is by telephone on 08/29 at 11:30 am  Please call the care guide team at [phone] if you need to cancel or reschedule your appointment.   If you are experiencing a Mental Health or Behavioral Health Crisis or need someone to talk to, please call the Suicide and Crisis Lifeline: 988 call 911   Patient verbalizes understanding of instructions and care plan provided today and agrees to view in MyChart. Active MyChart status and patient understanding of how to access instructions and care plan via MyChart confirmed with patient.     Jenel Lucks, MSW, LCSW [facility] Care Management [place]  [email or web address] Phone [phone] 6:40 AM

## 2023-01-07 ENCOUNTER — Telehealth: Payer: Self-pay | Admitting: Licensed Clinical Social Worker

## 2023-01-07 NOTE — Patient Outreach (Signed)
  Care Coordination   Follow Up Visit Note   01/04/2023 Name: Calvin Escobar MRN: [id number] DOB: 08/04/1950  Calvin Escobar is a 72 y.o. year old male who sees [name], MD for primary care. I spoke with  Calvin Escobar by phone today.  What matters to the patients health and wellness today?  Patient requested to r/s upcoming appt with LCSW     SDOH assessments and interventions completed:  No     Care Coordination Interventions:  Yes, provided   Follow up plan: Follow up call scheduled for 08/30    Encounter Outcome:  Pt. Visit Completed   Jenel Lucks, MSW, LCSW [facility] Care Management [facility]  [facility] [email or web address] Phone [phone] 3:45 PM

## 2023-01-07 NOTE — Patient Instructions (Signed)
Visit Information  Thank you for taking time to visit with me today. Please don't hesitate to contact me if I can be of assistance to you.    Our next appointment is by telephone on 08/30 at 11:30 AM  Please call the care guide team at [phone] if you need to cancel or reschedule your appointment.   If you are experiencing a Mental Health or Behavioral Health Crisis or need someone to talk to, please call the Suicide and Crisis Lifeline: 988 call 911   Patient verbalizes understanding of instructions and care plan provided today and agrees to view in MyChart. Active MyChart status and patient understanding of how to access instructions and care plan via MyChart confirmed with patient.     Jenel Lucks, MSW, LCSW [facility] Care Management [place]  [email or web address] Phone [phone] 3:46 PM

## 2023-01-10 DIAGNOSIS — F332 Major depressive disorder, recurrent severe without psychotic features: Secondary | ICD-10-CM | POA: Diagnosis not present

## 2023-01-10 DIAGNOSIS — F413 Other mixed anxiety disorders: Secondary | ICD-10-CM | POA: Diagnosis not present

## 2023-01-10 DIAGNOSIS — F1011 Alcohol abuse, in remission: Secondary | ICD-10-CM | POA: Diagnosis not present

## 2023-01-15 ENCOUNTER — Ambulatory Visit (INDEPENDENT_AMBULATORY_CARE_PROVIDER_SITE_OTHER): Payer: Medicare HMO | Admitting: Internal Medicine

## 2023-01-15 ENCOUNTER — Encounter: Payer: Self-pay | Admitting: Internal Medicine

## 2023-01-15 VITALS — BP 100/67 | HR 82 | Temp 98.0°F | Ht 72.0 in | Wt 258.6 lb

## 2023-01-15 DIAGNOSIS — K922 Gastrointestinal hemorrhage, unspecified: Secondary | ICD-10-CM

## 2023-01-15 DIAGNOSIS — R110 Nausea: Secondary | ICD-10-CM | POA: Diagnosis not present

## 2023-01-15 LAB — COMPREHENSIVE METABOLIC PANEL
ALT: 9 U/L (ref 0–53)
AST: 15 U/L (ref 0–37)
Albumin: 3.8 g/dL (ref 3.5–5.2)
Alkaline Phosphatase: 54 U/L (ref 39–117)
BUN: 15 mg/dL (ref 6–23)
CO2: 33 mEq/L — ABNORMAL HIGH (ref 19–32)
Calcium: 9.3 mg/dL (ref 8.4–10.5)
Chloride: 97 mEq/L (ref 96–112)
Creatinine, Ser: 1.16 mg/dL (ref 0.40–1.50)
GFR: 62.2 mL/min (ref >60.00)
Glucose, Bld: 112 mg/dL — ABNORMAL HIGH (ref 70–99)
Potassium: 4.1 mEq/L (ref 3.5–5.1)
Sodium: 138 mEq/L (ref 135–145)
Total Bilirubin: 0.5 mg/dL (ref 0.2–1.2)
Total Protein: 6.9 g/dL (ref 6.0–8.3)

## 2023-01-15 LAB — CBC WITH DIFFERENTIAL/PLATELET
Basophils Absolute: 0.1 10*3/uL (ref 0.0–0.1)
Basophils Relative: 0.6 % (ref 0.0–3.0)
Eosinophils Absolute: 0.1 10*3/uL (ref 0.0–0.7)
Eosinophils Relative: 1.4 % (ref 0.0–5.0)
HCT: 38.7 % — ABNORMAL LOW (ref 39.0–52.0)
Hemoglobin: 12.5 g/dL — ABNORMAL LOW (ref 13.0–17.0)
Lymphocytes Relative: 16.8 % (ref 12.0–46.0)
Lymphs Abs: 1.4 10*3/uL (ref 0.7–4.0)
MCHC: 32.2 g/dL (ref 30.0–36.0)
MCV: 90.3 fl (ref 78.0–100.0)
Monocytes Absolute: 1 10*3/uL (ref 0.1–1.0)
Monocytes Relative: 11.3 % (ref 3.0–12.0)
Neutro Abs: 5.9 10*3/uL (ref 1.4–7.7)
Neutrophils Relative %: 69.9 % (ref 43.0–77.0)
Platelets: 187 10*3/uL (ref 150.0–400.0)
RBC: 4.28 Mil/uL (ref 4.22–5.81)
RDW: 13.8 % (ref 11.5–15.5)
WBC: 8.5 10*3/uL (ref 4.0–10.5)

## 2023-01-15 LAB — LIPASE: Lipase: 15 U/L (ref 11.0–59.0)

## 2023-01-15 MED ORDER — ONDANSETRON HCL 4 MG PO TABS: 4.0000 mg | ORAL_TABLET | Freq: Three times a day (TID) | ORAL | 0 refills | Status: DC | PRN

## 2023-01-15 NOTE — Patient Instructions (Addendum)
Recommend liquid IV until pee looks clear.   Based on your current symptoms, please monitor your condition closely and go to the emergency room immediately if you experience any of the following:  Severe abdominal pain that doesn't go away High fever (above 101F or 38.3C) Significant increase in blood in your stool Signs of dehydration such as:  Extreme thirst Dry mouth and skin Little or no urination Feeling dizzy or lightheaded, especially when standing   Persistent vomiting that prevents you from keeping liquids down Symptoms of shock, like rapid heartbeat, cold/clammy skin, or confusion  In the meantime:  Stay hydrated by drinking plenty of clear fluids Rest as much as possible Avoid solid foods until your stools become more formed Consider over-the-counter oral rehydration solutions  We'll review your lab results as soon as they're available. If your symptoms worsen before then, don't hesitate to seek immediate medical attention. Take care and please keep [place] updated on your condition.

## 2023-01-15 NOTE — Progress Notes (Signed)
[facility]: [phone]   Routine Medical Office Visit  Patient:  Calvin Escobar      Age: 72 y.o.       Sex:  male  Date:   01/15/2023 Patient Care Team: [name], MD as PCP - General (Internal Medicine) Quintella Reichert, MD as PCP - Cardiology (Cardiology) Nita Sells, MD (Dermatology) [facility], [facility], [facility] (Behavioral Health) [facility], [place] (Psychiatry) Today's Healthcare Provider: [name], MD   Assessment and Plan:      Gastrointestinal Illness He is recovering from a recent onset of nausea, diarrhea, and abdominal pain with symptoms showing improvement. Currently, he reports no nausea or abdominal pain, and his stools are becoming more formed. A possible minor GI bleed was reported by his wife. We will order basic blood work to rule out significant blood loss. He is advised to hydrate with Gatorade or Liquid IV until his urine is clear and to avoid solid foods until his stool is formed. We will provide him with a list of symptoms indicating the need for an ER visit and consider Zofran for future bouts of nausea.  Medication Management Psychiatrist Dr. [name] has requested an updated medication list. We will send the updated medication list to Dr. [name] at [facility]. Today is best day in last 3-4 days, no nausea or loose bowels.   Abdominal Pain Started around 8/8. Symptoms have improved somewhat.  .  Assessment & Plan Nausea  Acute gastrointestinal hemorrhage Diarrhea had no blood or mucous. Appetite was low. Wife said there was blood in there. Advised patient to look at it closely and will get lab checks but this isn't certain. Recent started a medicine with a "b" probably buspirone from [facility]    Handout: Recommend liquid IV until pee looks clear. Based on your current symptoms, please monitor your condition closely and go to the emergency room immediately if you experience any of the  following:  Severe abdominal pain that doesn't go away High fever (above 101F or 38.3C) Significant increase in blood in your stool Signs of dehydration such as: Extreme thirst Dry mouth and skin Little or no urination Feeling dizzy or lightheaded, especially when standing Persistent vomiting that prevents you from keeping liquids down Symptoms of shock, like rapid heartbeat, cold/clammy skin, or confusion  In the meantime: Stay hydrated by drinking plenty of clear fluids Rest as much as possible Avoid solid foods until your stools become more formed Consider over-the-counter oral rehydration solutions  We'll review your lab results as soon as they're available. If your symptoms worsen before then, don't hesitate to seek immediate medical attention. Take care and please keep [place] updated on your condition.  Orders          Ordered    ondansetron (ZOFRAN) 4 MG tablet  Every 8 hours PRN        01/15/23 1229    CBC with Differential/Platelet        01/15/23 1229    Comp Met (CMET)        01/15/23 1229    Lipase        01/15/23 1229            Recommended follow up: No follow-ups on file.  Future Appointments  Date Time Provider Department Center  02/01/2023 11:30 AM Bridgett Larsson, LCSW [facility] None  02/05/2023 10:30 AM Henrene Pastor, RPH-CPP [facility] None  02/25/2023  3:35 PM Tereso Newcomer T,  PA-C [facility]  03/07/2023  1:20 PM [name], MD [facility]  05/17/2023  1:00 PM [facility] ANNUAL WELLNESS VISIT 1 [facility]           Clinical Presentation:    72 y.o. male who has Bipolar affective disorder, depressed (HCC); Chronic bronchitis (HCC); Dementia (HCC); Hyperlipidemia; Hyperkalemia; DCM (dilated cardiomyopathy) (HCC); OSA treated with BiPAP; Chronic systolic heart failure (HCC); Ascending aorta dilatation (HCC); Alcoholism in remission (HCC); Benign prostatic hyperplasia with urinary obstruction; PAC (premature atrial contraction);  Primary insomnia; PUD (peptic ulcer disease); Generalized arthritis; Morbid obesity (HCC); Urinary incontinence; Hypertension; Limp; Gynecomastia, male; and Chronic back pain on their problem list. His reasons/main concerns/chief complaints for today's office visit are Abdominal Pain (Started around 8/8. Symptoms have improved somewhat.), Diarrhea, and No appetite   AI-Extracted: Discussed the use of AI scribe software for clinical note transcription with the patient, who gave verbal consent to proceed.  History of Present Illness   The patient, with a history of psychiatric illness, presented with a recent episode of gastrointestinal upset characterized by nausea, diarrhea, and generalized abdominal discomfort. The symptoms began abruptly last Thursday, with the patient noting a significant change in his health status. The patient reported that his stomach was not "performing very well," leading to a period of diarrhea and decreased appetite that persisted over the weekend.  The patient also reported a concurrent episode of cold symptoms, though it is unclear whether these were related to the gastrointestinal symptoms. Despite the severity of the symptoms, the patient noted a gradual improvement over the past three to four days, with the current day being the "best day" he's had since the onset of the illness.  The patient also reported a recent visit to a psychiatrist, Dr. [name], who had prescribed a new medication. The patient was unsure of the name of the medication but noted that the dosage had been increased after two weeks. The patient did not report any adverse effects from this medication.  The patient also reported a history of dehydration, which he attributed to a decreased intake of fluids due to concerns about his gastrointestinal symptoms. He reported a limited intake of food and drink over the past few days, with only a cup of coffee consumed on the day of the visit.  The patient also  reported a recent change in bowel habits, with a shift from constipation to loose stools. He noted that his stools had improved slightly in consistency but were not yet solid. The patient's wife had observed a small amount of blood in his stool, though the patient was not aware of this.  The patient's abdominal pain, which was generalized and not localized to any specific quadrant, had mostly resolved by the time of the visit. The patient denied any current abdominal pain on examination. Despite the improvement in symptoms, the patient expressed a desire to ensure that his medications were appropriate and that there were no alarming findings on examination.       He  has a past medical history of Allergy (seasonal), Ascending aorta dilatation (HCC), Cancer (HCC) (skin), DCM (dilated cardiomyopathy) (HCC), Dementia (HCC), Depression, Hyperlipidemia, Lithium toxicity (01/29/2016), OSA treated with BiPAP, SKIN CANCER, HX OF (05/10/2007), and Urinary dribbling (03/14/2022).  Problem overviews that were updated today: No problems updated.  Current Outpatient Medications on File Prior to Visit  Medication Sig   busPIRone (BUSPAR) 10 MG tablet Take 10 mg by mouth 3 (three) times daily.   calcium-vitamin D (OSCAL WITH  D) 500-5 MG-MCG tablet Take 1 tablet by mouth daily with breakfast.   carvedilol (COREG) 12.5 MG tablet Take 1 tablet (12.5 mg total) by mouth 2 (two) times daily with a meal.   divalproex (DEPAKOTE ER) 250 MG 24 hr tablet Take 250 mg by mouth at bedtime.   LORazepam (ATIVAN) 1 MG tablet Take by mouth.   losartan (COZAAR) 50 MG tablet Take 1 tablet (50 mg total) by mouth daily. Replaces entresto, start after done with entresto   pravastatin (PRAVACHOL) 20 MG tablet Take 1 tablet (20 mg total) by mouth at bedtime.   tamsulosin (FLOMAX) 0.4 MG CAPS capsule TAKE 1 CAPSULE BY MOUTH EVERY DAY   traZODone (DESYREL) 100 MG tablet Take 300 mg by mouth at bedtime.   venlafaxine XR (EFFEXOR-XR) 150  MG 24 hr capsule Take 150 mg by mouth daily with breakfast.   SPIKEVAX syringe  (Patient not taking: Reported on 01/15/2023)   No current facility-administered medications on file prior to visit.   Medications Discontinued During This Encounter  Medication Reason   busPIRone (BUSPAR) 5 MG tablet    BOOSTRIX 5-2.5-18.5 LF-MCG/0.5 injection Completed Course         Clinical Data Analysis:   Physical Exam  BP 100/67 (BP Location: Left Arm, Patient Position: Sitting)   Pulse 82   Temp 98 F (36.7 C) (Temporal)   Ht 6' (1.829 m)   Wt 258 lb 9.6 oz (117.3 kg)   SpO2 95%   BMI 35.07 kg/m  Wt Readings from Last 10 Encounters:  01/15/23 258 lb 9.6 oz (117.3 kg)  12/19/22 265 lb 3.2 oz (120.3 kg)  11/21/22 267 lb 3.2 oz (121.2 kg)  07/18/22 279 lb 9.6 oz (126.8 kg)  05/14/22 282 lb 3.2 oz (128 kg)  05/04/22 282 lb 12.8 oz (128.3 kg)  03/14/22 280 lb 3.2 oz (127.1 kg)  04/18/21 275 lb (124.7 kg)  04/14/21 277 lb (125.6 kg)  09/09/20 284 lb 9.6 oz (129.1 kg)   Vital signs reviewed.  Nursing notes reviewed. Weight trend reviewed. Abnormalities and Problem-Specific physical exam findings:  dry mouth, dehydrated,  abdomen soft, nontender, nondistended General Appearance:  No acute distress appreciable.   Well-groomed, healthy-appearing male.  Well proportioned with no abnormal fat distribution.  Good muscle tone. Skin: Clear  Pulmonary:  Normal work of breathing at rest, no respiratory distress apparent. SpO2: 95 %  Musculoskeletal: All extremities are intact.  Neurological:  Awake, alert, oriented, and engaged.  No obvious focal neurological deficits or cognitive impairments.  Sensorium seems unclouded.   Speech is clear and coherent with logical content. Psychiatric:  Appropriate mood, pleasant and cooperative demeanor, thoughtful and engaged during the exam  Results Reviewed:     Results for orders placed or performed in visit on 01/15/23  CBC with Differential/Platelet  Result  Value Ref Range   WBC 8.5 4.0 - 10.5 K/uL   RBC 4.28 4.22 - 5.81 Mil/uL   Hemoglobin 12.5 (L) 13.0 - 17.0 g/dL   HCT 84.6 (L) 96.2 - 95.2 %   MCV 90.3 78.0 - 100.0 fl   MCHC 32.2 30.0 - 36.0 g/dL   RDW 84.1 32.4 - 40.1 %   Platelets 187.0 150.0 - 400.0 K/uL   Neutrophils Relative % 69.9 43.0 - 77.0 %   Lymphocytes Relative 16.8 12.0 - 46.0 %   Monocytes Relative 11.3 3.0 - 12.0 %   Eosinophils Relative 1.4 0.0 - 5.0 %   Basophils Relative 0.6 0.0 - 3.0 %  Neutro Abs 5.9 1.4 - 7.7 K/uL   Lymphs Abs 1.4 0.7 - 4.0 K/uL   Monocytes Absolute 1.0 0.1 - 1.0 K/uL   Eosinophils Absolute 0.1 0.0 - 0.7 K/uL   Basophils Absolute 0.1 0.0 - 0.1 K/uL  Comp Met (CMET)  Result Value Ref Range   Sodium 138 135 - 145 mEq/L   Potassium 4.1 3.5 - 5.1 mEq/L   Chloride 97 96 - 112 mEq/L   CO2 33 (H) 19 - 32 mEq/L   Glucose, Bld 112 (H) 70 - 99 mg/dL   BUN 15 6 - 23 mg/dL   Creatinine, Ser 3.08 0.40 - 1.50 mg/dL   Total Bilirubin 0.5 0.2 - 1.2 mg/dL   Alkaline Phosphatase 54 39 - 117 U/L   AST 15 0 - 37 U/L   ALT 9 0 - 53 U/L   Total Protein 6.9 6.0 - 8.3 g/dL   Albumin 3.8 3.5 - 5.2 g/dL   GFR 65.78 >46.96 mL/min   Calcium 9.3 8.4 - 10.5 mg/dL  Lipase  Result Value Ref Range   Lipase 15.0 11.0 - 59.0 U/L    Office Visit on 01/15/2023  Component Date Value   WBC 01/15/2023 8.5    RBC 01/15/2023 4.28    Hemoglobin 01/15/2023 12.5 (L)    HCT 01/15/2023 38.7 (L)    MCV 01/15/2023 90.3    MCHC 01/15/2023 32.2    RDW 01/15/2023 13.8    Platelets 01/15/2023 187.0    Neutrophils Relative % 01/15/2023 69.9    Lymphocytes Relative 01/15/2023 16.8    Monocytes Relative 01/15/2023 11.3    Eosinophils Relative 01/15/2023 1.4    Basophils Relative 01/15/2023 0.6    Neutro Abs 01/15/2023 5.9    Lymphs Abs 01/15/2023 1.4    Monocytes Absolute 01/15/2023 1.0    Eosinophils Absolute 01/15/2023 0.1    Basophils Absolute 01/15/2023 0.1    Sodium 01/15/2023 138    Potassium 01/15/2023 4.1     Chloride 01/15/2023 97    CO2 01/15/2023 33 (H)    Glucose, Bld 01/15/2023 112 (H)    BUN 01/15/2023 15    Creatinine, Ser 01/15/2023 1.16    Total Bilirubin 01/15/2023 0.5    Alkaline Phosphatase 01/15/2023 54    AST 01/15/2023 15    ALT 01/15/2023 9    Total Protein 01/15/2023 6.9    Albumin 01/15/2023 3.8    GFR 01/15/2023 62.20    Calcium 01/15/2023 9.3    Lipase 01/15/2023 15.0   Office Visit on 11/21/2022  Component Date Value   Sodium 11/21/2022 141    Potassium 11/21/2022 4.5    Chloride 11/21/2022 102    CO2 11/21/2022 31    Glucose, Bld 11/21/2022 106 (H)    BUN 11/21/2022 16    Creatinine, Ser 11/21/2022 1.12    GFR 11/21/2022 64.95    Calcium 11/21/2022 9.2    Vitamin B-12 11/21/2022 305    Folate 11/21/2022 >23.8    TSH 11/21/2022 0.968    Hgb A1c MFr Bld 11/21/2022 5.4   Lab on 08/29/2022  Component Date Value   Valproic Acid Lvl 08/29/2022 29.3 (L)    TSH 08/29/2022 1.42    HIV 1&2 Ab, 4th Generati* 08/29/2022 NON-REACTIVE    Hepatitis C Ab 08/29/2022 NON-REACTIVE    Cholesterol 08/29/2022 138    Triglycerides 08/29/2022 138.0    HDL 08/29/2022 40.80    VLDL 08/29/2022 27.6    LDL Cholesterol 08/29/2022 69    Total CHOL/HDL Ratio 08/29/2022  3    NonHDL 08/29/2022 96.89    Sodium 08/29/2022 140    Potassium 08/29/2022 5.4 No hemolysis seen (H)    Chloride 08/29/2022 101    CO2 08/29/2022 31    Glucose, Bld 08/29/2022 107 (H)    BUN 08/29/2022 20    Creatinine, Ser 08/29/2022 1.14    Total Bilirubin 08/29/2022 0.5    Alkaline Phosphatase 08/29/2022 71    AST 08/29/2022 16    ALT 08/29/2022 13    Total Protein 08/29/2022 7.3    Albumin 08/29/2022 4.3    GFR 08/29/2022 63.69    Calcium 08/29/2022 9.3    WBC 08/29/2022 7.1    RBC 08/29/2022 4.62    Hemoglobin 08/29/2022 13.7    HCT 08/29/2022 41.4    MCV 08/29/2022 89.5    MCHC 08/29/2022 33.2    RDW 08/29/2022 14.5    Platelets 08/29/2022 170.0    Neutrophils Relative % 08/29/2022 69.3     Lymphocytes Relative 08/29/2022 20.9    Monocytes Relative 08/29/2022 6.9    Eosinophils Relative 08/29/2022 2.1    Basophils Relative 08/29/2022 0.8    Neutro Abs 08/29/2022 4.9    Lymphs Abs 08/29/2022 1.5    Monocytes Absolute 08/29/2022 0.5    Eosinophils Absolute 08/29/2022 0.2    Basophils Absolute 08/29/2022 0.1    No image results found.   No results found.  DG UGI W DOUBLE CM (HD BA)  Result Date: 05/03/2021 CLINICAL DATA:  New onset nausea with eating. EXAM: UPPER GI SERIES WITH KUB TECHNIQUE: After obtaining a scout radiograph a routine upper GI series was performed using thin and high density barium. FLUOROSCOPY TIME:  Fluoroscopy Time:  1 minute 48 seconds Radiation Exposure Index (if provided by the fluoroscopic device): 54.4 mGy Number of Acquired Spot Images: 5 COMPARISON:  None. FINDINGS: Scout view of the abdomen shows a normal bowel gas pattern. No unexpected radiopaque calculi. Single swallow evaluation shows delayed initiation with a fair amount of residual contrast in the oropharynx. Sluggish esophageal motility with tertiary contractions. No esophageal fold thickening, stricture or obstruction. Tiny hiatal hernia. Stomach and duodenal bulb are unremarkable. IMPRESSION: 1. Mild esophageal dysmotility. 2. Tiny hiatal hernia. Electronically Signed   By: Leanna Battles M.D.   On: 05/03/2021 10:27      This encounter employed real-time, collaborative documentation. The patient actively reviewed and updated their medical record on a shared screen, ensuring transparency and facilitating joint problem-solving for the problem list, overview, and plan. This approach promotes accurate, informed care. The treatment plan was discussed and reviewed in detail, including medication safety, potential side effects, and all patient questions. We confirmed understanding and comfort with the plan. Follow-up instructions were established, including contacting the office for any concerns,  returning if symptoms worsen, persist, or new symptoms develop, and precautions for potential emergency department visits. ----------------------------------------------------- [name], MD  01/15/2023 6:09 PM  [facility]:  [phone]

## 2023-01-31 ENCOUNTER — Encounter: Payer: Medicare HMO | Admitting: Licensed Clinical Social Worker

## 2023-02-01 ENCOUNTER — Telehealth: Payer: Self-pay

## 2023-02-01 ENCOUNTER — Ambulatory Visit: Payer: Self-pay | Admitting: Licensed Clinical Social Worker

## 2023-02-01 DIAGNOSIS — G8929 Other chronic pain: Secondary | ICD-10-CM

## 2023-02-01 DIAGNOSIS — I5022 Chronic systolic (congestive) heart failure: Secondary | ICD-10-CM

## 2023-02-01 NOTE — Telephone Encounter (Signed)
   Telephone encounter was:  Successful.  02/01/2023 Name: Calvin Escobar MRN: [id number] DOB: 03-12-1951  Calvin Escobar is a 72 y.o. year old male who is a primary care patient of [name], MD . The community resource team was consulted for assistance with Transportation Needs   Care guide performed the following interventions: Patient provided with information about care guide support team and interviewed to confirm resource needs.Patient needs transportation on 9/23 I will follow back up with Patient on 9/3 to arrange the transportation   Follow Up Plan:  Care guide will follow up with patient by phone over the next week    [facility]  [facility] Guide, Phone: [phone] Website: [email or web address]

## 2023-02-01 NOTE — Patient Instructions (Signed)
Visit Information  Thank you for taking time to visit with me today. Please don't hesitate to contact me if I can be of assistance to you.   Following are the goals we discussed today:   Goals Addressed             This Visit's Progress    Obtain Supportive Resources-Counselor   On track    Activities and task to complete in order to accomplish goals.   Keep all upcoming appointments discussed today Continue with compliance of taking medication prescribed by Doctor Implement healthy coping skills discussed to assist with management of symptoms Review counseling resources provided and/or discussed. F/up with Psychiatrist if you experience any adverse side effects           Our next appointment is by telephone on 09/13 at 11:30 AM  Please call the care guide team at [phone] if you need to cancel or reschedule your appointment.   If you are experiencing a Mental Health or Behavioral Health Crisis or need someone to talk to, please call the Suicide and Crisis Lifeline: 988 call 911   Patient verbalizes understanding of instructions and care plan provided today and agrees to view in MyChart. Active MyChart status and patient understanding of how to access instructions and care plan via MyChart confirmed with patient.     Jenel Lucks, MSW, LCSW [facility] Care Management [place]  [facility] [email or web address] Phone [phone] 12:40 PM

## 2023-02-01 NOTE — Patient Outreach (Signed)
  Care Coordination   Follow Up Visit Note   02/01/2023 Name: Calvin Escobar MRN: [id number] DOB: 1951-03-31  Calvin Escobar is a 72 y.o. year old male who sees Calvin Olszewski, MD for primary care. I spoke with  Calvin Escobar by phone today.  What matters to the patients health and wellness today?  Transportation and Symptom Management    Goals Addressed             This Visit's Progress    Obtain Supportive Resources-Counselor   On track    Activities and task to complete in order to accomplish goals.   Keep all upcoming appointments discussed today Continue with compliance of taking medication prescribed by Doctor Implement healthy coping skills discussed to assist with management of symptoms Review counseling resources provided and/or discussed. F/up with Psychiatrist if you experience any adverse side effects           SDOH assessments and interventions completed:  No     Care Coordination Interventions:  Yes, provided  Interventions Today    Flowsheet Row Most Recent Value  Chronic Disease   Chronic disease during today's visit Hypertension (HTN), Other  [Dementia, Bipolar Disorder, Chronic back pain]  General Interventions   General Interventions Discussed/Reviewed General Interventions Reviewed, [facility], Doctor Visits, Communication with  Doctor Visits Discussed/Reviewed Doctor Visits Reviewed  Communication with Social Work  [LCSW completed a care guide referral to assist with transportation to medical appts]  Mental Health Interventions   Mental Health Discussed/Reviewed Mental Health Reviewed, Coping Strategies, Anxiety, Depression  Nutrition Interventions   Nutrition Discussed/Reviewed Nutrition Reviewed  Pharmacy Interventions   Pharmacy Dicussed/Reviewed Pharmacy Topics Reviewed, Medication Adherence  Safety Interventions   Safety Discussed/Reviewed Safety Reviewed       Follow up plan: Follow up call scheduled for 2-4 weeks    Encounter  Outcome:  Pt. Visit Completed   Calvin Escobar, MSW, LCSW [facility] Care Management [facility]  [facility] [email or web address] Phone [phone] 12:39 PM

## 2023-02-05 ENCOUNTER — Telehealth: Payer: Self-pay

## 2023-02-05 ENCOUNTER — Encounter: Payer: Self-pay | Admitting: Pharmacist

## 2023-02-05 ENCOUNTER — Telehealth: Payer: Self-pay | Admitting: Pharmacist

## 2023-02-05 ENCOUNTER — Encounter: Payer: Medicare HMO | Admitting: Pharmacist

## 2023-02-05 NOTE — Telephone Encounter (Signed)
   Telephone encounter was:  Unsuccessful.  02/05/2023 Name: Rosco Elmore MRN: [id number] DOB: [birth year]  Unsuccessful outbound call made today to assist with:  Transportation Needs   Outreach Attempt:  2nd Attempt  A HIPAA compliant voice message was left requesting a return call.  Instructed patient to call back   [facility]  [facility] Guide, Phone: [phone] Website: [email or web address]

## 2023-02-05 NOTE — Progress Notes (Unsigned)
02/05/2023 Name: Calvin Escobar MRN: [id number] DOB: 07-31-1950  Chief Complaint  Patient presents with   Medication Management    Follow up    Calvin Escobar is a 72 y.o. year old male who presented for a telephone visit.   They were referred to the pharmacist by their PCP for assistance in managing medication access and complex medication management.    Subjective:  Care Team: Primary Care Provider: [name], MD ; Next Scheduled Visit: 03/07/2023 Psychiatrist: Dr Calvin Escobar; Next Scheduled Visit: 12/27/2022  Medication Access/Adherence  Current Pharmacy:  [id number] - [place], [place] - [address] [place] [address] Phone: [phone] Fax: [phone]   Patient reports affordability concerns with their medications: Yes  - He never started Vraylar - possibly due to cost but patient did not recall why he didn't start.  Patient reports access/transportation concerns to their pharmacy: No  - he states he is currently driving but may have to stop soon due to vision changes.  Patient reports adherence concerns with their medications:  No  - wife is helping with medication administration   Hyperlipidemia/ASCVD Risk Reduction  Current lipid lowering medications: pravastatin 20mg  daily  Medications tried in the past:   Antiplatelet regimen: none  ASCVD History: none Family History: no Risk Factors: obesity, CHF,   CHF / Dilated Cardiomyopathy: Managed by Calvin Escobar  Current medications:  ACEi/ARB/ARNI: losartan SGLT2i: no Beta blocker: carvedilol  Mineralocorticoid Receptor Antagonist: no  Diuretic regimen: none  Took Entresto in past but was changed to losartan 12/2021  Patient denies volume overload signs or symptoms including no shortness of breath, lower extremity edema, increased use of pillows at night   Obesity/Overweight Comorbidities: urinary incontinence, cardiomyopathy, elevated HDL  Current medications:  none  Weight Management treatments previously prescribed:  Ozempic but not covered by his insurance / Medicare plan.   He does not currently meet criteria for [facility] for decreasing CVD in patients with past history of MI, stroke or PAD.   Anxiety / Bipolar / insomnia:  Current medications:  Divalproex 250mg  - directions are to take 250mg  daily for 1 week, then increase to 500mg  daily. Patient has been taking only 250mg  daily for last 2 months.  Lorazepam 1mg  - take 1 tablet each morning and 2 tablets each evening at bedtime.  Trazodone 100mg  - take 3 tablets = 300mg  daily at bedtime Venlafaxine 225mg  once a day at breakfast.  Buspirone 5mg  - take 1 tablet 3 times a day (Started 12/13/2022)  Prescribed Vraylar - never started; needed prior authorization and even after prior authorization copay was $99/month  Medications tried in the past: sertraline, paroxetine and escitalopram Also took olanzapine / Zyprexa - 03/2012 to 02/2016 and aripiprazole 2mg  from 2020 to 2022  Behavioral Health support:  Psychiatrist is now Calvin Escobar - has only seen him once.  Also attending classes at [facility]. Was going 3 or 4 times per week but some of the classes he was attending have stopped. He is going only 1 ot 2 times per week now. He feels like these classes have been very helpful.   Current medication access support: none   Objective:  Lab Results  Component Value Date   HGBA1C 5.4 11/21/2022    Lab Results  Component Value Date   CREATININE 1.16 01/15/2023   BUN 15 01/15/2023   NA 138 01/15/2023   K 4.1 01/15/2023   CL 97 01/15/2023   CO2 33 (H) 01/15/2023  Lab Results  Component Value Date   CHOL 138 08/29/2022   HDL 40.80 08/29/2022   LDLCALC 69 08/29/2022   TRIG 138.0 08/29/2022   CHOLHDL 3 08/29/2022    Medications Reviewed Today   Medications were not reviewed in this encounter       Assessment/Plan:   Hyperlipidemia/ASCVD Risk Reduction: Last  LDL at goal of < 70 - Recommend to continue pravastatin 20mg  daily - reviewed refill history. Patient has filled on time so far in 2024.    CHF / Dilated Cardiomyopathy:  - continue to follow up with Calvin Escobar / cardiology - will see prior authorization Calvin Escobar 02/25/2023 - Continue losartan and carvedilol  - continue to monitor potassium - if chronically elevated might need to consider lower dose of losartan.   Bipolar / Insomnia / Depression/Anxiety: anxiety improved a little with addition of buspirone.   - continue classes at [facility] and follow up with psychiatrist.   - Recommended patient discuss dose of divalproex with Calvin Escobar   Follow Up Plan: 1 to 2 months  Henrene Pastor, PharmD Clinical Pharmacist [facility] Primary Care  Population Health

## 2023-02-05 NOTE — Telephone Encounter (Signed)
Second unsuccessful outreach today. Will try to reach patient again later this week or next week.

## 2023-02-05 NOTE — Telephone Encounter (Signed)
Unsuccessful outreach for follow up medication management. No VM.  Will try to call again later.

## 2023-02-07 ENCOUNTER — Telehealth: Payer: Self-pay

## 2023-02-07 ENCOUNTER — Encounter: Payer: Self-pay | Admitting: Licensed Clinical Social Worker

## 2023-02-07 NOTE — Progress Notes (Signed)
This encounter was created in error - please disregard.

## 2023-02-07 NOTE — Telephone Encounter (Signed)
   Telephone encounter was:  Unsuccessful.  02/07/2023 Name: Calvin Escobar MRN: [id number] DOB: 04-Feb-1951  Unsuccessful outbound call made today to assist with:  Transportation Needs   Outreach Attempt:  3rd Attempt.  Referral closed unable to contact patient.  A HIPAA compliant voice message was left requesting a return call.  Instructed patient to call back.    Lenard Forth [employer] [facility]  Value-Based Care Institute, [facility] Guide, Phone: [phone] Website: [email or web address]

## 2023-02-07 NOTE — Telephone Encounter (Signed)
   Telephone encounter was:  Unsuccessful.  02/07/2023 Name: Calvin Escobar MRN: [id number] DOB: 05-30-1951  Unsuccessful outbound call made today to assist with:  Transportation Needs  FOLLOWING UP NEEDING TRANSPORTATION DETAILS  Outreach Attempt:  4TH  A HIPAA compliant voice message was left requesting a return call.  Instructed patient to call back .   Lenard Forth [place]  Value-Based Care Institute, [facility] Guide, Phone: [phone] Website: [email or web address]

## 2023-02-08 NOTE — Patient Outreach (Signed)
  Care Coordination   Collaboration  Visit Note   02/07/2023 Name: Calvin Escobar MRN: [id number] DOB: [birth year]  Calvin Escobar is a 72 y.o. year old male who sees [name], MD for primary care. I  engaged with care guide  What matters to the patients health and wellness today?  Patient was not engaged during this encounter    Goals Addressed             This Visit's Progress    Obtain Supportive Resources-Counselor   On track    Activities and task to complete in order to accomplish goals.   Keep all upcoming appointments discussed today Continue with compliance of taking medication prescribed by Doctor Implement healthy coping skills discussed to assist with management of symptoms Review counseling resources provided and/or discussed. F/up with Psychiatrist if you experience any adverse side effects           SDOH assessments and interventions completed:  No     Care Coordination Interventions:  Yes, provided   Follow up plan: Follow up call scheduled for 09/13    Encounter Outcome:  Patient Visit Completed   Jenel Lucks, MSW, LCSW [facility] Care Management [facility] Health  [facility] [email or web address] Phone [phone] 12:15 AM

## 2023-02-12 DIAGNOSIS — Z85820 Personal history of malignant melanoma of skin: Secondary | ICD-10-CM | POA: Diagnosis not present

## 2023-02-12 DIAGNOSIS — Z1283 Encounter for screening for malignant neoplasm of skin: Secondary | ICD-10-CM | POA: Diagnosis not present

## 2023-02-12 DIAGNOSIS — D045 Carcinoma in situ of skin of trunk: Secondary | ICD-10-CM | POA: Diagnosis not present

## 2023-02-12 DIAGNOSIS — D225 Melanocytic nevi of trunk: Secondary | ICD-10-CM | POA: Diagnosis not present

## 2023-02-12 DIAGNOSIS — Z08 Encounter for follow-up examination after completed treatment for malignant neoplasm: Secondary | ICD-10-CM | POA: Diagnosis not present

## 2023-02-12 NOTE — Telephone Encounter (Signed)
This encounter was created in error - please disregard.

## 2023-02-15 ENCOUNTER — Ambulatory Visit: Payer: Self-pay | Admitting: Licensed Clinical Social Worker

## 2023-02-16 NOTE — Patient Instructions (Signed)
Visit Information  Thank you for taking time to visit with me today. Please don't hesitate to contact me if I can be of assistance to you.   Following are the goals we discussed today:   Goals Addressed             This Visit's Progress    Obtain Supportive Resources-Counselor   On track    Activities and task to complete in order to accomplish goals.   Keep all upcoming appointments discussed today Continue with compliance of taking medication prescribed by Doctor Implement healthy coping skills discussed to assist with management of symptoms Review counseling resources provided and/or discussed. F/up with Psychiatrist if you experience any adverse side effects           Our next appointment is by telephone on 10/04 at 11:30 PM  Please call the care guide team at [phone] if you need to cancel or reschedule your appointment.   If you are experiencing a Mental Health or Behavioral Health Crisis or need someone to talk to, please call the Suicide and Crisis Lifeline: 988 call 911   Patient verbalizes understanding of instructions and care plan provided today and agrees to view in MyChart. Active MyChart status and patient understanding of how to access instructions and care plan via MyChart confirmed with patient.     Jenel Lucks, MSW, LCSW [facility] Care Management [place]  [facility] [email or web address] Phone [phone] 3:31 PM

## 2023-02-16 NOTE — Patient Outreach (Signed)
Care Coordination   Follow Up Visit Note   02/15/2023 Name: Calvin Escobar MRN: [id number] DOB: 08/13/1950  Calvin Escobar is a 72 y.o. year old male who sees [name], MD for primary care. I spoke with  [name] by phone today.  What matters to the patients health and wellness today?  Symptom Management and Transportation    Goals Addressed             This Visit's Progress    Obtain Supportive Resources-Counselor   On track    Activities and task to complete in order to accomplish goals.   Keep all upcoming appointments discussed today Continue with compliance of taking medication prescribed by Doctor Implement healthy coping skills discussed to assist with management of symptoms Review counseling resources provided and/or discussed. F/up with Psychiatrist if you experience any adverse side effects           SDOH assessments and interventions completed:  No     Care Coordination Interventions:  Yes, provided  Interventions Today    Flowsheet Row Most Recent Value  Chronic Disease   Chronic disease during today's visit Hypertension (HTN), Other  [Dementia and Bipolar Disorder]  General Interventions   General Interventions Discussed/Reviewed General Interventions Reviewed, [facility], Doctor Visits  [LCSW confirmed with patient transportation has been scheduled for upcoming HeartCare appt.]  Doctor Visits Discussed/Reviewed Doctor Visits Reviewed  Mental Health Interventions   Mental Health Discussed/Reviewed Mental Health Reviewed, Coping Strategies, Anxiety, Depression  [Pt endorses increase in feelings of overwhelm due to multiple appts to manage chronic health and mental health conditions. Strategies to assist with time-management and stress relief discussed. Pt reports feeling "happier" today. Mindfulness discussed]  Nutrition Interventions   Nutrition Discussed/Reviewed Nutrition Reviewed  Pharmacy Interventions   Pharmacy Dicussed/Reviewed  Pharmacy Topics Reviewed, Medication Adherence  Safety Interventions   Safety Discussed/Reviewed Safety Reviewed       Follow up plan: Follow up call scheduled for 2-4 weeks    Encounter Outcome:  Patient Visit Completed   Jenel Lucks, MSW, LCSW [facility] Care Management [facility] Health  [facility] [email or web address] Phone [phone] 3:31 PM

## 2023-02-18 ENCOUNTER — Encounter: Payer: Self-pay | Admitting: Licensed Clinical Social Worker

## 2023-02-18 ENCOUNTER — Telehealth: Payer: Self-pay

## 2023-02-18 NOTE — Telephone Encounter (Signed)
Telephone encounter was:  Successful.  02/18/2023 Name: Calvin Escobar MRN: [id number] DOB: 1950-08-21  Calvin Escobar is a 72 y.o. year old male who is a primary care patient of [name], MD . The community resource team was consulted for assistance with Transportation Needs   Care guide performed the following interventions: Patient provided with information about care guide support team and interviewed to confirm resource needs.Patient wanted to follow up about his transportation ride on 9/23 and has all ride confirmation information   Follow Up Plan:  No further follow up planned at this time. The patient has been provided with needed resources.   Lenard Forth [place]  Value-Based Care Institute, [facility] Guide, Phone: [phone] Website: [email or web address]

## 2023-02-20 NOTE — Patient Instructions (Signed)
Visit Information  Thank you for taking time to visit with me today. Please don't hesitate to contact me if I can be of assistance to you.   Following are the goals we discussed today:   Goals Addressed   None     Our next appointment is by telephone on 10/04 at 11:30 AM  Please call the care guide team at [phone] if you need to cancel or reschedule your appointment.   If you are experiencing a Mental Health or Behavioral Health Crisis or need someone to talk to, please call the Suicide and Crisis Lifeline: 988 call 911   Patient verbalizes understanding of instructions and care plan provided today and agrees to view in MyChart. Active MyChart status and patient understanding of how to access instructions and care plan via MyChart confirmed with patient.     Jenel Lucks, MSW, LCSW [facility] Care Management [place]  [facility] [email or web address] Phone [phone] 4:28 PM

## 2023-02-20 NOTE — Patient Outreach (Signed)
Care Coordination   Collaboration  Visit Note   02/18/2023 Name: Calvin Escobar MRN: [id number] DOB: 1951-03-26  Calvin Escobar is a 72 y.o. year old male who sees [name], MD for primary care. I  engaged with Care Guide regarding transportation needs  What matters to the patients health and wellness today?  Patient was not engaged during this encounter    SDOH assessments and interventions completed:  No     Care Coordination Interventions:  Yes, provided  Interventions Today    Flowsheet Row Most Recent Value  Chronic Disease   Chronic disease during today's visit Hypertension (HTN), Other  [Dementia and Bipolar and Chronic Pain Disorder]  General Interventions   General Interventions Discussed/Reviewed Communication with, [employer] has discussed transportation options, such as, [employer] [place]. Applications take approximately 2-3 months to be approved and will not be a feasible option by upcoming appts]  Communication with --  [LCSW collaborated with Care Guide regarding pt's ongoing transportation needs. LCSW was encouraged to place new referral for appt scheduled on 10/10 as it is too early to request transportation now.]       Follow up plan: Follow up call scheduled for 1-2 weeks    Encounter Outcome:  Patient Visit Completed   Jenel Lucks, MSW, LCSW [facility] Care Management [facility] Health  [facility] [email or web address] Phone [phone] 4:27 PM

## 2023-02-21 DIAGNOSIS — F1011 Alcohol abuse, in remission: Secondary | ICD-10-CM | POA: Diagnosis not present

## 2023-02-21 DIAGNOSIS — F332 Major depressive disorder, recurrent severe without psychotic features: Secondary | ICD-10-CM | POA: Diagnosis not present

## 2023-02-21 DIAGNOSIS — F413 Other mixed anxiety disorders: Secondary | ICD-10-CM | POA: Diagnosis not present

## 2023-02-25 ENCOUNTER — Encounter: Payer: Self-pay | Admitting: Physician Assistant

## 2023-02-25 ENCOUNTER — Ambulatory Visit: Payer: Medicare HMO | Attending: Physician Assistant | Admitting: Physician Assistant

## 2023-02-25 VITALS — BP 123/80 | HR 85 | Ht 66.0 in | Wt 255.2 lb

## 2023-02-25 DIAGNOSIS — I77810 Thoracic aortic ectasia: Secondary | ICD-10-CM

## 2023-02-25 DIAGNOSIS — E7800 Pure hypercholesterolemia, unspecified: Secondary | ICD-10-CM | POA: Diagnosis not present

## 2023-02-25 DIAGNOSIS — I5020 Unspecified systolic (congestive) heart failure: Secondary | ICD-10-CM | POA: Diagnosis not present

## 2023-02-25 DIAGNOSIS — G4733 Obstructive sleep apnea (adult) (pediatric): Secondary | ICD-10-CM

## 2023-02-25 NOTE — Patient Instructions (Signed)
Medication Instructions:  Your physician recommends that you continue on your current medications as directed. Please refer to the Current Medication list given to you today.  *If you need a refill on your cardiac medications before your next appointment, please call your pharmacy*   Lab Work: None ordered  If you have labs (blood work) drawn today and your tests are completely normal, you will receive your results only by: MyChart Message (if you have MyChart) OR A paper copy in the mail If you have any lab test that is abnormal or we need to change your treatment, we will call you to review the results.   Testing/Procedures: Your physician has requested that you have an echocardiogram. Echocardiography is a painless test that uses sound waves to create images of your heart. It provides your doctor with information about the size and shape of your heart and how well your heart's chambers and valves are working. This procedure takes approximately one hour. There are no restrictions for this procedure. Please do NOT wear cologne, perfume, aftershave, or lotions (deodorant is allowed). Please arrive 15 minutes prior to your appointment time.    Follow-Up: At [facility], you and your health needs are our priority.  As part of our continuing mission to provide you with exceptional heart care, we have created designated Provider Care Teams.  These Care Teams include your primary Cardiologist (physician) and Advanced Practice Providers (APPs -  Physician Assistants and Nurse Practitioners) who all work together to provide you with the care you need, when you need it.  We recommend signing up for the patient portal called "MyChart".  Sign up information is provided on this After Visit Summary.  MyChart is used to connect with patients for Virtual Visits (Telemedicine).  Patients are able to view lab/test results, encounter notes, upcoming appointments, etc.  Non-urgent messages can be  sent to your provider as well.   To learn more about what you can do with MyChart, go to [email or web address].    Your next appointment:   12 month(s)  Provider:   Armanda Magic, MD  or Tereso Newcomer, PA-C         Other Instructions

## 2023-02-25 NOTE — Progress Notes (Signed)
Cardiology Office Note:    Date:  02/25/2023  ID:  [name], DOB [birth year], MRN [id number] PCP: [name], MD   HeartCare Providers Cardiologist:  Armanda Magic, MD       Patient Profile:      (HFrEF) heart failure with reduced ejection fraction  Non-ischemic cardiomyopathy  LHC 07/03/19: Normal Cors; PCWP 11, mean PA 18 CHF Clinic: not a candidate for advanced Rx; graduated from HF Clinic 02/2020 EF 25-30 >> Improved to 40 TTE 02/19/20: EF 43 TTE 04/14/21: EF 35-40, septal HK, mild LVH, Gr 1 DD, NL RVSF, mild dilation of ascending aorta (38 mm) Intol of Spiro due to ? K+ Dilated ascending aorta  Hyperlipidemia  Morbid obesity OSA on BiPAP        History of Present Illness:  Discussed the use of AI scribe software for clinical note transcription with the patient, who gave verbal consent to proceed.   Calvin Escobar is a 72 y.o. male who returns for follow-up of CHF.  Patient was last seen by Dr. Mayford Knife November 2022. He is here alone. He has not had shortness of breath, chest pain, or edema. He is able to walk up stairs and lay flat to sleep without difficulty. However, he has been struggling with depression and anxiety, experiencing periodic crying spells despite being on several medications and seeing a psychiatrist and therapist. He is also attending classes for anxiety control.     ROS:  See HPI No claudication +Depression    Studies Reviewed:   EKG Interpretation Date/Time:  Monday February 25 2023 15:39:07 EDT Ventricular Rate:  85 PR Interval:  160 QRS Duration:  124 QT Interval:  382 QTC Calculation: 454 R Axis:   -41  Text Interpretation: Normal sinus rhythm Left anterior fasicular block Left ventricular hypertrophy No significant change since last tracing Confirmed by Tereso Newcomer [phone]) on 02/25/2023 4:28:15 PM    Risk Assessment/Calculations:             Physical Exam:   VS:  BP 123/80   Pulse 85   Ht 5\' 6"  (1.676 m)   Wt 255 lb 3.2 oz  (115.8 kg)   SpO2 94%   BMI 41.19 kg/m    Wt Readings from Last 3 Encounters:  02/25/23 255 lb 3.2 oz (115.8 kg)  01/15/23 258 lb 9.6 oz (117.3 kg)  12/19/22 265 lb 3.2 oz (120.3 kg)    Constitutional:      Appearance: Healthy appearance. Not in distress.  Neck:     Vascular: No JVR. JVD normal.  Pulmonary:     Breath sounds: Normal breath sounds. No wheezing. No rales.  Cardiovascular:     Normal rate. Regular rhythm.     Murmurs: There is no murmur.  Edema:    Peripheral edema absent.  Abdominal:     Palpations: Abdomen is soft.      Assessment and Plan:     Heart Failure with Reduced Ejection Fraction (HFrEF) EF 35-40% on last echocardiogram in November 2022. NYHA II. Volume status stable. -Continue Carvedilol 12.5mg  twice daily and losartan 50mg  daily.  -He was intolerant of spironolactone due to hyperkalemia in the past. -Arrange f/u echocardiogram to monitor aorta dilation.  Dilated Ascending Aorta Last measured at 38mm on echocardiogram in 2022. -Order echocardiogram to reassess aorta dilation.  Hyperlipidemia Well controlled with pravastatin 20mg  daily. LDL optimal at 69 in March 2024. -Continue pravastatin 20mg  daily.  Obstructive Sleep Apnea (OSA) Patient not currently using BiPAP. He could  not tolerate this and DC'd several years ago.          Dispo:  Return in about 1 year (around 02/25/2024) for Routine Follow Up w/ Dr. Mayford Knife .  Signed, Tereso Newcomer, PA-C

## 2023-03-07 ENCOUNTER — Ambulatory Visit: Payer: Medicare HMO | Admitting: Internal Medicine

## 2023-03-07 ENCOUNTER — Encounter: Payer: Self-pay | Admitting: Internal Medicine

## 2023-03-07 VITALS — BP 131/85 | HR 87 | Temp 97.7°F | Ht 66.0 in | Wt 256.4 lb

## 2023-03-07 DIAGNOSIS — D649 Anemia, unspecified: Secondary | ICD-10-CM

## 2023-03-07 DIAGNOSIS — I420 Dilated cardiomyopathy: Secondary | ICD-10-CM

## 2023-03-07 DIAGNOSIS — F3131 Bipolar disorder, current episode depressed, mild: Secondary | ICD-10-CM | POA: Diagnosis not present

## 2023-03-07 DIAGNOSIS — N1831 Chronic kidney disease, stage 3a: Secondary | ICD-10-CM | POA: Insufficient documentation

## 2023-03-07 DIAGNOSIS — N401 Enlarged prostate with lower urinary tract symptoms: Secondary | ICD-10-CM | POA: Diagnosis not present

## 2023-03-07 DIAGNOSIS — N138 Other obstructive and reflux uropathy: Secondary | ICD-10-CM | POA: Diagnosis not present

## 2023-03-07 DIAGNOSIS — N183 Chronic kidney disease, stage 3 unspecified: Secondary | ICD-10-CM

## 2023-03-07 DIAGNOSIS — D638 Anemia in other chronic diseases classified elsewhere: Secondary | ICD-10-CM | POA: Insufficient documentation

## 2023-03-07 MED ORDER — FOLIC ACID 1 MG PO TABS: 1.0000 mg | ORAL_TABLET | Freq: Every day | ORAL | 12 refills | Status: DC

## 2023-03-07 NOTE — Assessment & Plan Note (Signed)
There has been an improvement in his heart function from the previous echocardiogram. He is scheduled for another echocardiogram. We will continue with the scheduled echocardiogram.

## 2023-03-07 NOTE — Assessment & Plan Note (Signed)
A slight drop in hemoglobin has been noted, with no signs of bleeding reported by him. We will repeat the hemoglobin test to confirm if anemia persists.

## 2023-03-07 NOTE — Patient Instructions (Addendum)
VISIT SUMMARY:  During your recent visit, we discussed your ongoing issues with anxiety, insomnia, urinary incontinence, heart function, kidney disease, and anemia. We also talked about your general health maintenance. You reported some improvement in your anxiety and sleep, thanks to your current medications and therapy sessions. However, you continue to experience some anxiety and urinary issues. You also mentioned a scheduled echocardiogram for your heart and your decision not to pursue a urology referral at this time.  YOUR PLAN:  -ANXIETY: Your anxiety has improved with your current treatment, which includes medications and group therapy sessions. We will continue with this plan. Anxiety is a condition that causes you to feel worried, nervous, or uneasy.  -INSOMNIA: You reported good sleep with your current medications, zolpidem and trazodone. We will continue with these medications. Insomnia is a sleep disorder that makes it hard for you to fall asleep, stay asleep, or both.  -URINARY INCONTINENCE: You use adult diapers due to urinary issues, which you believe is due to an enlarged prostate. We have cancelled the urology referral as per your preference. Urinary incontinence is a condition where you lose control of your bladder.  -CARDIAC FUNCTION: Your heart function has improved from the previous echocardiogram. We will continue with the scheduled echocardiogram. This is a test that uses sound waves to create pictures of your heart, to check how well it's working.  -CHRONIC KIDNEY DISEASE: We discussed the importance of avoiding ibuprofen and staying hydrated for your kidney health. We will continue to monitor your kidney function. Chronic kidney disease is a condition where your kidneys don't work as well as they should.  -ANEMIA: We noted a slight drop in your hemoglobin levels, but you reported no signs of bleeding. We will repeat the hemoglobin test to confirm if anemia persists. Anemia is  a condition where your body doesn't have enough red blood cells or hemoglobin.  -GENERAL HEALTH MAINTENANCE: We will schedule an annual wellness visit and physical exam. You have been advised to avoid high potassium foods, specifically bananas, due to previous high potassium levels. We will check Depakote levels if deemed necessary by your psychiatrist.  INSTRUCTIONS:  Please continue with your current medications and therapy sessions for anxiety and insomnia. Remember to avoid ibuprofen and stay hydrated for your kidney health. Also, avoid high potassium foods, specifically bananas. We will schedule an annual wellness visit and physical exam for you. Please continue with the scheduled echocardiogram for your heart. We will also repeat the hemoglobin test to check for anemia.

## 2023-03-07 NOTE — Progress Notes (Signed)
[facility]: [phone]   -- Medical Office Visit --  Patient:  Calvin Escobar      Age: 72 y.o.       Sex:  male  Date:   03/07/2023 Patient Care Team: [name], MD as PCP - General (Internal Medicine) Quintella Reichert, MD as PCP - Cardiology (Cardiology) Nita Sells, MD (Dermatology) [facility], [facility], [facility] (Behavioral Health) [facility], [place] (Psychiatry) Today's Healthcare Provider: [name], MD      Assessment & Plan Bipolar affective disorder, currently depressed, mild [facility]) Updated problem overview for this problem to improve longitudinal management Seems currently stable well managed by psychiatry team. He reports good sleep with the current regimen of zolpidem 10mg  and trazodone 100mg . We will continue his current medications. His anxiety has shown improvement with ongoing treatment through [facility] and psychiatrist [name]. He is on multiple medications including venlafaxine, lorazepam, and buspirone, and also attending group therapy sessions, which seem to be beneficial. We will continue his current medications and therapy sessions. Benign prostatic hyperplasia with urinary obstruction Requested we cancel urology referral we made for this, I agreed to his request although I did encouraged patient to to reconsider due to wearing diapers with signs & symptoms or large prostate and urinary incontinence. He uses adult diapers, changing once a day, with no urgency or frequency noted. There has been no recent urology follow-up, and he is not currently on Flomax. The urology referral has been cancelled as per his preference. Anemia, unspecified type Discussed this new finding.  DCM (dilated cardiomyopathy) (HCC) There has been an improvement in his heart function from the previous echocardiogram. He is scheduled for another echocardiogram. We will continue with the scheduled echocardiogram. Stage 3 chronic kidney  disease, unspecified whether stage 3a or 3b CKD (HCC) He has been advised to avoid ibuprofen and to stay hydrated, with a possible connection to an enlarged prostate discussed. We will monitor his kidney function. Anemia, normocytic normochromic A slight drop in hemoglobin has been noted, with no signs of bleeding reported by him. We will repeat the hemoglobin test to confirm if anemia persists.  General Health Maintenance We will schedule an annual wellness visit and physical exam. He has been advised to avoid high potassium foods, specifically bananas, due to previous high potassium levels. We will check Depakote levels if deemed necessary by his psychiatrist.      Diagnoses and all orders for this visit: Bipolar affective disorder, currently depressed, mild (HCC) Benign prostatic hyperplasia with urinary obstruction Anemia, unspecified type -     folic acid (FOLVITE) 1 MG tablet; Take 1 tablet (1 mg total) by mouth daily. -     CBC; Future -     Ferritin; Future -     Folate; Future -     Pathologist smear review; Future -     Reticulocytes; Future -     Sedimentation rate, automated; Future -     Vitamin B12; Future DCM (dilated cardiomyopathy) (HCC) Stage 3 chronic kidney disease, unspecified whether stage 3a or 3b CKD (HCC) Anemia, normocytic normochromic  Recommended follow-up: Return in 3 months (on 06/07/2023). Future Appointments  Date Time Provider Department Center  03/08/2023 11:30 AM Jenel Lucks D, LCSW [facility] None  03/15/2023 11:30 AM [facility] E[facility] 1 [id number]  05/08/2023  1:20 PM [name], MD [facility]  05/17/2023  1:00 PM [facility] ANNUAL WELLNESS VISIT 1 [facility]  Subjective   72 y.o. male who has Bipolar affective disorder, depressed (HCC); Chronic bronchitis (HCC); Dementia (HCC); Hyperlipidemia; Hyperkalemia; DCM (dilated cardiomyopathy) (HCC); OSA treated with BiPAP; HFrEF (heart failure with reduced ejection fraction)  (HCC); Ascending aorta dilatation (HCC); Alcoholism in remission (HCC); Benign prostatic hyperplasia with urinary obstruction; PAC (premature atrial contraction); Primary insomnia; PUD (peptic ulcer disease); Generalized arthritis; Morbid obesity (HCC); Urinary incontinence; Hypertension; Limp; Gynecomastia, male; Chronic back pain; Stage 3 chronic kidney disease (HCC); and Anemia, normocytic normochromic on their problem list. His reasons/main concerns/chief complaints for today's office visit are 3 month follow-up   ------------------------------------------------------------------------------------------------------------------------ AI-Extracted: Discussed the use of AI scribe software for clinical note transcription with the patient, who gave verbal consent to proceed.  History of Present Illness   The patient, with a history of anxiety, heart disease, and urinary issues, presents for a three-month follow-up. He reports a slight improvement in his anxiety, with fewer episodes of crying. However, he continues to struggle with occasional bouts of anxiety, which he attributes to current events. He is currently receiving treatment through [facility] and a psychiatrist at [facility].  The patient also reports a good response to his sleep medications, which include zolpidem 10mg  and trazodone 100mg . He also takes venlafaxine for mood improvement and lorazepam 1mg  three times a day. He believes he is also taking buspirone for anxiety, but he is no longer on lithium.  The patient has been attending group therapy sessions about three times a week, which he finds helpful. He reports no issues with breathing or swelling in his legs. He does, however, wear a depend once a day due to urinary issues, which he believes is due to an enlarged prostate. He does not report any excessive urination or difficulty reaching the bathroom in time.  The patient also mentions a recent visit to a cardiologist, who suggested an  echocardiogram. He has this scheduled for the 11th of the next month. He also has a referral for a urologist due to his urinary issues, but he is unsure if he wants to pursue this. He reports no changes in his medication regimen and no new medications from [facility]. He also reports no blood in his stools or urine.  The patient acknowledges that he could improve his diet, as he often skips meals due to a lack of appetite. He attributes this to his wife not being home to prepare meals. Despite this, he reports sleeping well due to his medication regimen. He also expresses a desire to get outside more for exercise, as he finds it beneficial for both his physical and mental health.      He has a past medical history of Allergy (seasonal), Ascending aorta dilatation (HCC), Cancer (HCC) (skin), DCM (dilated cardiomyopathy) (HCC), Dementia (HCC), Depression, Hyperlipidemia, Lithium toxicity (01/29/2016), OSA treated with BiPAP, SKIN CANCER, HX OF (05/10/2007), and Urinary dribbling (03/14/2022).  Problem list overviews that were updated at today's visit: Problem  Stage 3 Chronic Kidney Disease (Hcc)  Anemia, Normocytic Normochromic  Benign Prostatic Hyperplasia With Urinary Obstruction   Dribbling/nocturia wears diapers or special underwear It gets less wet as long as he stays on Flomax/tamsulosin No results found for: "PSA1", "PSA"  >>OVERVIEW FOR URINARY DRIBBLING WRITTEN ON 05/14/2022  1:39 PM BY [name], MD  Requires diapers and special underwear that has been getting less wet with flomax Causes odor.     Bipolar Affective Disorder, Depressed (Hcc)   Now following with [facility] psychiatry Current Medications:   zolpidem  10 mg daily trazodone 100 mg twice daily venlafaxine 150 mg daily lorazepam 1 mg  3 times daily 1 in the morning 2 at bedtime, buspirone. History of lithium toxicity -currently not on mood stabilizer  Noted history of bipolar 1 in chart. And self endorses the  diagnosis  Following with [facility]/classes  Following with [facility] [place] [facility]  Used to following with [facility] with Dr. Lily Peer but felt it wasn't for him. Following with peer support [name] who manages 2 of the classes he goes to. 3-4 classes a week.  anxiety not well controlled long term; very stressful triggered by negative world news.     Current Outpatient Medications on File Prior to Visit  Medication Sig   busPIRone (BUSPAR) 10 MG tablet Take 10 mg by mouth 3 (three) times daily.   calcium-vitamin D (OSCAL WITH D) 500-5 MG-MCG tablet Take 1 tablet by mouth daily with breakfast.   carvedilol (COREG) 12.5 MG tablet Take 1 tablet (12.5 mg total) by mouth 2 (two) times daily with a meal.   divalproex (DEPAKOTE ER) 250 MG 24 hr tablet Take 250 mg by mouth at bedtime.   LORazepam (ATIVAN) 1 MG tablet Take one tablet by mouth in the morning and two tablets at bedtime   losartan (COZAAR) 50 MG tablet Take 1 tablet (50 mg total) by mouth daily. Replaces entresto, start after done with entresto   ondansetron (ZOFRAN) 4 MG tablet Take 1 tablet (4 mg total) by mouth every 8 (eight) hours as needed for nausea or vomiting.   pravastatin (PRAVACHOL) 20 MG tablet Take 1 tablet (20 mg total) by mouth at bedtime.   tamsulosin (FLOMAX) 0.4 MG CAPS capsule TAKE 1 CAPSULE BY MOUTH EVERY DAY   traZODone (DESYREL) 100 MG tablet Take 300 mg by mouth at bedtime.   venlafaxine XR (EFFEXOR-XR) 150 MG 24 hr capsule Take 150 mg by mouth daily with breakfast.   No current facility-administered medications on file prior to visit.  There are no discontinued medications.   Objective   Physical Exam  BP 131/85 (BP Location: Right Arm, Patient Position: Sitting)   Pulse 87   Temp 97.7 F (36.5 C) (Temporal)   Ht 5\' 6"  (1.676 m)   Wt 256 lb 6.4 oz (116.3 kg)   SpO2 97%   BMI 41.38 kg/m  Wt Readings from Last 10 Encounters:  03/07/23 256 lb 6.4 oz (116.3 kg)   02/25/23 255 lb 3.2 oz (115.8 kg)  01/15/23 258 lb 9.6 oz (117.3 kg)  12/19/22 265 lb 3.2 oz (120.3 kg)  11/21/22 267 lb 3.2 oz (121.2 kg)  07/18/22 279 lb 9.6 oz (126.8 kg)  05/14/22 282 lb 3.2 oz (128 kg)  05/04/22 282 lb 12.8 oz (128.3 kg)  03/14/22 280 lb 3.2 oz (127.1 kg)  04/18/21 275 lb (124.7 kg)   Vital signs reviewed.  Nursing notes reviewed. Weight trend reviewed. Abnormalities and Problem-Specific physical exam findings:  truncal adiposity, mildly depressed.  General Appearance:  No acute distress appreciable.   Well-groomed, healthy-appearing male.  Well proportioned with no abnormal fat distribution.  Good muscle tone. Pulmonary:  Normal work of breathing at rest, no respiratory distress apparent. SpO2: 97 %  Musculoskeletal: All extremities are intact.  Neurological:  Awake, alert, oriented, and engaged.  No obvious focal neurological deficits or cognitive impairments.  Sensorium seems unclouded.   Speech is clear and coherent with logical content. Psychiatric:  Appropriate mood, pleasant and cooperative demeanor, thoughtful  and engaged during the exam  Results   LABS Hb: Slightly low (02/2023) Potassium: Normal (02/2023)  DIAGNOSTIC Echocardiogram: Improvement in heart function from 25-30% to 40%. Thickened septum and slight dilation of aorta noted. (02/25/2023)        No results found for any visits on 03/07/23.  Office Visit on 01/15/2023  Component Date Value   WBC 01/15/2023 8.5    RBC 01/15/2023 4.28    Hemoglobin 01/15/2023 12.5 (L)    HCT 01/15/2023 38.7 (L)    MCV 01/15/2023 90.3    MCHC 01/15/2023 32.2    RDW 01/15/2023 13.8    Platelets 01/15/2023 187.0    Neutrophils Relative % 01/15/2023 69.9    Lymphocytes Relative 01/15/2023 16.8    Monocytes Relative 01/15/2023 11.3    Eosinophils Relative 01/15/2023 1.4    Basophils Relative 01/15/2023 0.6    Neutro Abs 01/15/2023 5.9    Lymphs Abs 01/15/2023 1.4    Monocytes Absolute 01/15/2023 1.0     Eosinophils Absolute 01/15/2023 0.1    Basophils Absolute 01/15/2023 0.1    Sodium 01/15/2023 138    Potassium 01/15/2023 4.1    Chloride 01/15/2023 97    CO2 01/15/2023 33 (H)    Glucose, Bld 01/15/2023 112 (H)    BUN 01/15/2023 15    Creatinine, Ser 01/15/2023 1.16    Total Bilirubin 01/15/2023 0.5    Alkaline Phosphatase 01/15/2023 54    AST 01/15/2023 15    ALT 01/15/2023 9    Total Protein 01/15/2023 6.9    Albumin 01/15/2023 3.8    GFR 01/15/2023 62.20    Calcium 01/15/2023 9.3    Lipase 01/15/2023 15.0   Office Visit on 11/21/2022  Component Date Value   Sodium 11/21/2022 141    Potassium 11/21/2022 4.5    Chloride 11/21/2022 102    CO2 11/21/2022 31    Glucose, Bld 11/21/2022 106 (H)    BUN 11/21/2022 16    Creatinine, Ser 11/21/2022 1.12    GFR 11/21/2022 64.95    Calcium 11/21/2022 9.2    Vitamin B-12 11/21/2022 305    Folate 11/21/2022 >23.8    TSH 11/21/2022 0.968    Hgb A1c MFr Bld 11/21/2022 5.4   Lab on 08/29/2022  Component Date Value   Valproic Acid Lvl 08/29/2022 29.3 (L)    TSH 08/29/2022 1.42    HIV 1&2 Ab, 4th Generati* 08/29/2022 NON-REACTIVE    Hepatitis C Ab 08/29/2022 NON-REACTIVE    Cholesterol 08/29/2022 138    Triglycerides 08/29/2022 138.0    HDL 08/29/2022 40.80    VLDL 08/29/2022 27.6    LDL Cholesterol 08/29/2022 69    Total CHOL/HDL Ratio 08/29/2022 3    NonHDL 08/29/2022 96.89    Sodium 08/29/2022 140    Potassium 08/29/2022 5.4 No hemolysis seen (H)    Chloride 08/29/2022 101    CO2 08/29/2022 31    Glucose, Bld 08/29/2022 107 (H)    BUN 08/29/2022 20    Creatinine, Ser 08/29/2022 1.14    Total Bilirubin 08/29/2022 0.5    Alkaline Phosphatase 08/29/2022 71    AST 08/29/2022 16    ALT 08/29/2022 13    Total Protein 08/29/2022 7.3    Albumin 08/29/2022 4.3    GFR 08/29/2022 63.69    Calcium 08/29/2022 9.3    WBC 08/29/2022 7.1    RBC 08/29/2022 4.62    Hemoglobin 08/29/2022 13.7    HCT 08/29/2022 41.4    MCV 08/29/2022  89.5    MCHC  08/29/2022 33.2    RDW 08/29/2022 14.5    Platelets 08/29/2022 170.0    Neutrophils Relative % 08/29/2022 69.3    Lymphocytes Relative 08/29/2022 20.9    Monocytes Relative 08/29/2022 6.9    Eosinophils Relative 08/29/2022 2.1    Basophils Relative 08/29/2022 0.8    Neutro Abs 08/29/2022 4.9    Lymphs Abs 08/29/2022 1.5    Monocytes Absolute 08/29/2022 0.5    Eosinophils Absolute 08/29/2022 0.2    Basophils Absolute 08/29/2022 0.1    No image results found.   No results found.  DG UGI W DOUBLE CM (HD BA)  Result Date: 05/03/2021 CLINICAL DATA:  New onset nausea with eating. EXAM: UPPER GI SERIES WITH KUB TECHNIQUE: After obtaining a scout radiograph a routine upper GI series was performed using thin and high density barium. FLUOROSCOPY TIME:  Fluoroscopy Time:  1 minute 48 seconds Radiation Exposure Index (if provided by the fluoroscopic device): 54.4 mGy Number of Acquired Spot Images: 5 COMPARISON:  None. FINDINGS: Scout view of the abdomen shows a normal bowel gas pattern. No unexpected radiopaque calculi. Single swallow evaluation shows delayed initiation with a fair amount of residual contrast in the oropharynx. Sluggish esophageal motility with tertiary contractions. No esophageal fold thickening, stricture or obstruction. Tiny hiatal hernia. Stomach and duodenal bulb are unremarkable. IMPRESSION: 1. Mild esophageal dysmotility. 2. Tiny hiatal hernia. Electronically Signed   By: Leanna Battles M.D.   On: 05/03/2021 10:27       Additional Info: This encounter employed real-time, collaborative documentation. The patient actively reviewed and updated their medical record on a shared screen, ensuring transparency and facilitating joint problem-solving for the problem list, overview, and plan. This approach promotes accurate, informed care. The treatment plan was discussed and reviewed in detail, including medication safety, potential side effects, and all patient questions.  We confirmed understanding and comfort with the plan. Follow-up instructions were established, including contacting the office for any concerns, returning if symptoms worsen, persist, or new symptoms develop, and precautions for potential emergency department visits.

## 2023-03-07 NOTE — Assessment & Plan Note (Signed)
>>  ASSESSMENT AND PLAN FOR NORMOCYTIC ANEMIA WRITTEN ON 03/07/2023  3:18 PM BY Humberto Addo G, MD  A slight drop in hemoglobin has been noted, with no signs of bleeding reported by him. We will repeat the hemoglobin test to confirm if anemia persists.

## 2023-03-07 NOTE — Assessment & Plan Note (Addendum)
Requested we cancel urology referral we made for this, I agreed to his request although I did encouraged patient to to reconsider due to wearing diapers with signs & symptoms or large prostate and urinary incontinence. He uses adult diapers, changing once a day, with no urgency or frequency noted. There has been no recent urology follow-up, and he is not currently on Flomax. The urology referral has been cancelled as per his preference.

## 2023-03-07 NOTE — Assessment & Plan Note (Signed)
He has been advised to avoid ibuprofen and to stay hydrated, with a possible connection to an enlarged prostate discussed. We will monitor his kidney function.

## 2023-03-07 NOTE — Assessment & Plan Note (Addendum)
Updated problem overview for this problem to improve longitudinal management Seems currently stable well managed by psychiatry team. He reports good sleep with the current regimen of zolpidem 10mg  and trazodone 100mg . We will continue his current medications. His anxiety has shown improvement with ongoing treatment through [facility] and psychiatrist [name]. He is on multiple medications including venlafaxine, lorazepam, and buspirone, and also attending group therapy sessions, which seem to be beneficial. We will continue his current medications and therapy sessions.

## 2023-03-08 ENCOUNTER — Ambulatory Visit: Payer: Self-pay | Admitting: Licensed Clinical Social Worker

## 2023-03-11 NOTE — Patient Instructions (Signed)
Visit Information  Thank you for taking time to visit with me today. Please don't hesitate to contact me if I can be of assistance to you.   Following are the goals we discussed today:   Goals Addressed             This Visit's Progress    Obtain Supportive Resources-Counselor   On track    Activities and task to complete in order to accomplish goals.   Keep all upcoming appointments discussed today Continue with compliance of taking medication prescribed by Doctor Implement healthy coping skills discussed to assist with management of symptoms Review counseling resources provided and/or discussed. F/up with Psychiatrist if you experience any adverse side effects           Our next appointment is by telephone on 10/18 at 3:30 PM  Please call the care guide team at [phone] if you need to cancel or reschedule your appointment.   If you are experiencing a Mental Health or Behavioral Health Crisis or need someone to talk to, please call the Suicide and Crisis Lifeline: 988 call 911   Patient verbalizes understanding of instructions and care plan provided today and agrees to view in MyChart. Active MyChart status and patient understanding of how to access instructions and care plan via MyChart confirmed with patient.     Jenel Lucks, MSW, LCSW [facility] Care Management [place]  [facility] [email or web address] Phone [phone] 9:14 AM

## 2023-03-11 NOTE — Patient Outreach (Signed)
Care Coordination   Follow Up Visit Note   03/08/2023 Name: Calvin Escobar MRN: [id number] DOB: 24-Aug-1950  Calvin Escobar is a 72 y.o. year old male who sees [name], MD for primary care. I spoke with  [name] by phone today.  What matters to the patients health and wellness today?  Symptom Management    Goals Addressed             This Visit's Progress    Obtain Supportive Resources-Counselor   On track    Activities and task to complete in order to accomplish goals.   Keep all upcoming appointments discussed today Continue with compliance of taking medication prescribed by Doctor Implement healthy coping skills discussed to assist with management of symptoms Review counseling resources provided and/or discussed. F/up with Psychiatrist if you experience any adverse side effects           SDOH assessments and interventions completed:  No     Care Coordination Interventions:  Yes, provided  Interventions Today    Flowsheet Row Most Recent Value  Chronic Disease   Chronic disease during today's visit Hypertension (HTN), Other  [Dementia and Chronic Pain Disorder]  General Interventions   General Interventions Discussed/Reviewed General Interventions Reviewed, Doctor Visits, [employer] is f/up with Cardiology and reports good report from recent appts. Has upcoming EKG scheduled]  Doctor Visits Discussed/Reviewed Doctor Visits Reviewed, Specialist  [Patient reports that he does not want to f/up with Urologist at this time. Agreed to f/up should symptoms worsen. Pt has discussed this with PCP]  Mental Health Interventions   Mental Health Discussed/Reviewed Mental Health Reviewed, Coping Strategies, Anxiety, Depression  [Patient discussed progress and current strategies to assist with symptom management. He continues to participate in classes and peer support through [employer]. LCSW strongly encouraged self-care, pt agreed to prioritize "me  time"]  Nutrition Interventions   Nutrition Discussed/Reviewed Nutrition Reviewed  Pharmacy Interventions   Pharmacy Dicussed/Reviewed Pharmacy Topics Reviewed, Medication Adherence  Safety Interventions   Safety Discussed/Reviewed Safety Reviewed       Follow up plan: Follow up call scheduled for 2-4 weeks    Encounter Outcome:  Patient Visit Completed   Jenel Lucks, MSW, LCSW [facility] Care Management [facility]  [facility] [email or web address] Phone [phone] 9:13 AM

## 2023-03-14 DIAGNOSIS — N3941 Urge incontinence: Secondary | ICD-10-CM | POA: Diagnosis not present

## 2023-03-14 DIAGNOSIS — N401 Enlarged prostate with lower urinary tract symptoms: Secondary | ICD-10-CM | POA: Diagnosis not present

## 2023-03-15 ENCOUNTER — Ambulatory Visit (HOSPITAL_COMMUNITY): Payer: Medicare HMO | Attending: Physician Assistant

## 2023-03-15 ENCOUNTER — Telehealth: Payer: Self-pay | Admitting: *Deleted

## 2023-03-15 DIAGNOSIS — I5020 Unspecified systolic (congestive) heart failure: Secondary | ICD-10-CM

## 2023-03-15 LAB — ECHOCARDIOGRAM COMPLETE
Area-P 1/2: 5.42 cm2
S' Lateral: 3.6 cm

## 2023-03-15 NOTE — Telephone Encounter (Signed)
-----   Message from Tereso Newcomer sent at 03/15/2023  3:51 PM EDT ----- Results sent to [name] via MyChart. See MyChart comments below. I will fwd a copy to PCP Jon Billings, Johnella Moloney, MD) as Lorain Childes. PLAN:  -Echocardiogram in 1 year: Dx (HFrEF) heart failure with reduced ejection fraction, ascending aorta dilation   Mr. Jeffreys  Your echocardiogram shows stable, moderately reduced heart function (ejection fraction). There has been no change since the last echocardiogram. Your aorta size is stable at 40 mm. We will repeat an echocardiogram in 1 year to continue to monitor this. Continue current medications/treatment plan and follow up as scheduled.  Tereso Newcomer, PA-C    03/15/2023 3:48 PM

## 2023-03-21 DIAGNOSIS — F332 Major depressive disorder, recurrent severe without psychotic features: Secondary | ICD-10-CM | POA: Diagnosis not present

## 2023-03-21 DIAGNOSIS — F413 Other mixed anxiety disorders: Secondary | ICD-10-CM | POA: Diagnosis not present

## 2023-03-21 DIAGNOSIS — F1011 Alcohol abuse, in remission: Secondary | ICD-10-CM | POA: Diagnosis not present

## 2023-03-22 ENCOUNTER — Ambulatory Visit: Payer: Self-pay | Admitting: Licensed Clinical Social Worker

## 2023-03-22 NOTE — Patient Outreach (Signed)
Care Coordination   Follow Up Visit Note   03/22/2023 Name: Calvin Escobar MRN: [id number] DOB: 08-18-1950  Calvin Escobar is a 72 y.o. year old male who sees Calvin Olszewski, MD for primary care. I spoke with  Calvin Escobar by phone today.  What matters to the patients health and wellness today?  Symptom Management    Goals Addressed             This Visit's Progress    Obtain Supportive Resources-Counselor   On track    Activities and task to complete in order to accomplish goals.   Keep all upcoming appointments discussed today Continue with compliance of taking medication prescribed by Doctor Implement healthy coping skills discussed to assist with management of symptoms Review counseling resources provided and/or discussed. F/up with Psychiatrist if you experience any adverse side effects Complete required ppwk for [facility]           SDOH assessments and interventions completed:  No     Care Coordination Interventions:  Yes, provided  Interventions Today    Flowsheet Row Most Recent Value  Chronic Disease   Chronic disease during today's visit Hypertension (HTN), Other  [Dementia and Chronic Pain Disorder]  General Interventions   General Interventions Discussed/Reviewed General Interventions Reviewed, [facility], Doctor Visits  Doctor Visits Discussed/Reviewed Doctor Visits Reviewed  Mental Health Interventions   Mental Health Discussed/Reviewed Mental Health Reviewed, Coping Strategies, Anxiety, Depression  [Pt has established with Dr. [name] for med management. No barriers in obtaining his medications. Patient continues to receive services with [facility]]  Pharmacy Interventions   Pharmacy Dicussed/Reviewed Pharmacy Topics Reviewed, Medication Adherence  Safety Interventions   Safety Discussed/Reviewed Safety Reviewed       Follow up plan: Follow up call scheduled for 4 weeks    Encounter Outcome:  Patient Visit Completed   Jenel Lucks, MSW, LCSW [facility] Care Management [facility]  [facility] [email or web address] Phone [phone] 4:57 PM

## 2023-03-22 NOTE — Patient Instructions (Signed)
Visit Information  Thank you for taking time to visit with me today. Please don't hesitate to contact me if I can be of assistance to you.   Following are the goals we discussed today:   Goals Addressed             This Visit's Progress    Obtain Supportive Resources-Counselor   On track    Activities and task to complete in order to accomplish goals.   Keep all upcoming appointments discussed today Continue with compliance of taking medication prescribed by Doctor Implement healthy coping skills discussed to assist with management of symptoms Review counseling resources provided and/or discussed. F/up with Psychiatrist if you experience any adverse side effects Complete required ppwk for [facility]           Our next appointment is by telephone on 11/15 at 11:30 AM  Please call the care guide team at [phone] if you need to cancel or reschedule your appointment.   If you are experiencing a Mental Health or Behavioral Health Crisis or need someone to talk to, please call the Suicide and Crisis Lifeline: 988 call 911   Patient verbalizes understanding of instructions and care plan provided today and agrees to view in MyChart. Active MyChart status and patient understanding of how to access instructions and care plan via MyChart confirmed with patient.     Jenel Lucks, MSW, LCSW [facility] Care Management [place]  [facility] [email or web address] Phone [phone] 4:58 PM

## 2023-03-26 DIAGNOSIS — Z08 Encounter for follow-up examination after completed treatment for malignant neoplasm: Secondary | ICD-10-CM | POA: Diagnosis not present

## 2023-03-26 DIAGNOSIS — Z85828 Personal history of other malignant neoplasm of skin: Secondary | ICD-10-CM | POA: Diagnosis not present

## 2023-04-09 ENCOUNTER — Telehealth: Payer: Self-pay | Admitting: Internal Medicine

## 2023-04-09 NOTE — Telephone Encounter (Signed)
Patient would like to speak with someone to help him set up to pay his balance of $125.00.Marland Kitchen  Can someone give him a call to set this up?   Thank you  Gabriel Cirri [facility] AWV TEAM Direct Dial [phone]

## 2023-04-09 NOTE — Telephone Encounter (Signed)
Called and informed patient that since $125 is a hospital bill, he would have to call the billing department. I provided patient with the contact information. Pt verbalized understanding.

## 2023-04-18 DIAGNOSIS — F413 Other mixed anxiety disorders: Secondary | ICD-10-CM | POA: Diagnosis not present

## 2023-04-18 DIAGNOSIS — F332 Major depressive disorder, recurrent severe without psychotic features: Secondary | ICD-10-CM | POA: Diagnosis not present

## 2023-04-18 DIAGNOSIS — F1011 Alcohol abuse, in remission: Secondary | ICD-10-CM | POA: Diagnosis not present

## 2023-04-19 ENCOUNTER — Ambulatory Visit: Payer: Self-pay | Admitting: Licensed Clinical Social Worker

## 2023-04-19 NOTE — Patient Instructions (Signed)
Visit Information  Thank you for taking time to visit with me today. Please don't hesitate to contact me if I can be of assistance to you.   Following are the goals we discussed today:   Goals Addressed             This Visit's Progress    Obtain Supportive Resources-Counselor   On track    Activities and task to complete in order to accomplish goals.   Keep all upcoming appointments discussed today Continue with compliance of taking medication prescribed by Doctor Implement healthy coping skills discussed to assist with management of symptoms Review counseling resources provided and/or discussed. F/up with Psychiatrist if you experience any adverse side effects Continue participation with community agencies           Our next appointment is by telephone on 05/24/23 at 11:30 AM  Please call the care guide team at [phone] if you need to cancel or reschedule your appointment.   If you are experiencing a Mental Health or Behavioral Health Crisis or need someone to talk to, please call the Suicide and Crisis Lifeline: 988 call 911   Patient verbalizes understanding of instructions and care plan provided today and agrees to view in MyChart. Active MyChart status and patient understanding of how to access instructions and care plan via MyChart confirmed with patient.     Jenel Lucks, MSW, LCSW [facility] Care Management [place]  [facility] [email or web address] Phone [phone] 11:56 AM

## 2023-04-19 NOTE — Patient Outreach (Signed)
  Care Coordination   Follow Up Visit Note   04/19/2023 Name: Calvin Escobar MRN: [id number] DOB: Jul 31, 1950  Calvin Escobar is a 72 y.o. year old male who sees Calvin Olszewski, MD for primary care. I spoke with  Calvin Escobar by phone today.  What matters to the patients health and wellness today?  Symptom Management    Goals Addressed             This Visit's Progress    Obtain Supportive Resources-Counselor   On track    Activities and task to complete in order to accomplish goals.   Keep all upcoming appointments discussed today Continue with compliance of taking medication prescribed by Doctor Implement healthy coping skills discussed to assist with management of symptoms Review counseling resources provided and/or discussed. F/up with Psychiatrist if you experience any adverse side effects Continue participation with community agencies           SDOH assessments and interventions completed:  No     Care Coordination Interventions:  Yes, provided  Interventions Today    Flowsheet Row Most Recent Value  Chronic Disease   Chronic disease during today's visit Hypertension (HTN), Chronic Kidney Disease/End Stage Renal Disease (ESRD), Other  [Dementia, Chronic Back Pain, Bipolar Disorder]  General Interventions   General Interventions Discussed/Reviewed General Interventions Reviewed, Doctor Visits, Community Resources  Doctor Visits Discussed/Reviewed Doctor Visits Reviewed  Exercise Interventions   Exercise Discussed/Reviewed Exercise Reviewed  [LCSW discussed strategies to maintain activity during this season to assist with symptom management]  Mental Health Interventions   Mental Health Discussed/Reviewed Mental Health Reviewed, Coping Strategies, Anxiety, Depression  [Continues to participate in [facility]. Peer Support Counselor met on 11/11, endorsed strong progress in symptom management. Met with psychiatrist 11/14, doing well. Identified hobbies and strategies  to maintain activity]  Nutrition Interventions   Nutrition Discussed/Reviewed Nutrition Reviewed  [Patient reports decrease in appetite. Will f/up with PCP at upcoming appts]  Pharmacy Interventions   Pharmacy Dicussed/Reviewed Pharmacy Topics Reviewed, Medication Adherence       Follow up plan: Follow up call scheduled for 05/24/23    Encounter Outcome:  Patient Visit Completed   Calvin Escobar, MSW, LCSW [facility] Care Management [facility]  [facility] [email or web address] Phone [phone] 11:56 AM

## 2023-05-08 ENCOUNTER — Ambulatory Visit (INDEPENDENT_AMBULATORY_CARE_PROVIDER_SITE_OTHER): Payer: Medicare HMO | Admitting: Internal Medicine

## 2023-05-08 ENCOUNTER — Encounter: Payer: Self-pay | Admitting: Internal Medicine

## 2023-05-08 VITALS — BP 103/65 | HR 79 | Temp 97.8°F | Ht 66.0 in | Wt 241.0 lb

## 2023-05-08 DIAGNOSIS — F0394 Unspecified dementia, unspecified severity, with anxiety: Secondary | ICD-10-CM | POA: Diagnosis not present

## 2023-05-08 DIAGNOSIS — R630 Anorexia: Secondary | ICD-10-CM

## 2023-05-08 DIAGNOSIS — D649 Anemia, unspecified: Secondary | ICD-10-CM | POA: Diagnosis not present

## 2023-05-08 DIAGNOSIS — R251 Tremor, unspecified: Secondary | ICD-10-CM | POA: Diagnosis not present

## 2023-05-08 DIAGNOSIS — F3131 Bipolar disorder, current episode depressed, mild: Secondary | ICD-10-CM

## 2023-05-08 HISTORY — DX: Anorexia: R63.0

## 2023-05-08 LAB — VITAMIN B12: Vitamin B-12: 306 pg/mL (ref 211–911)

## 2023-05-08 LAB — CBC
HCT: 30.9 % — ABNORMAL LOW (ref 39.0–52.0)
Hemoglobin: 10.1 g/dL — ABNORMAL LOW (ref 13.0–17.0)
MCHC: 32.8 g/dL (ref 30.0–36.0)
MCV: 85.6 fL (ref 78.0–100.0)
Platelets: 249 10*3/uL (ref 150.0–400.0)
RBC: 3.61 Mil/uL — ABNORMAL LOW (ref 4.22–5.81)
RDW: 15.1 % (ref 11.5–15.5)
WBC: 6.5 10*3/uL (ref 4.0–10.5)

## 2023-05-08 LAB — FOLATE: Folate: >24.2 ng/mL (ref >5.9)

## 2023-05-08 LAB — SEDIMENTATION RATE: Sed Rate: 60 mm/h — ABNORMAL HIGH (ref 0–20)

## 2023-05-08 LAB — FERRITIN: Ferritin: >1500 ng/mL — ABNORMAL HIGH (ref 22.0–322.0)

## 2023-05-08 MED ORDER — PRIMIDONE 50 MG PO TABS: 50.0000 mg | ORAL_TABLET | Freq: Four times a day (QID) | ORAL | 1 refills | Status: DC | PRN

## 2023-05-08 NOTE — Progress Notes (Signed)
[facility]: [phone]   -- Medical Office Visit --  Patient:  Calvin Escobar      Age: 72 y.o.       Sex:  male  Date:   05/08/2023 Today's Healthcare Provider: [name], MD  ==========================================================================     Assessment & Plan Tremor Presumed Medication-induced tremor management initiated with primidone, targeting symptomatic relief while maintaining current psychiatric medication stability.  Assessment: - New-onset severe tremor, likely medication-induced - Temporally associated with psychiatric medication changes - Impact on quality of life noted but not severely limiting ADLs - Differential includes medication-induced parkinsonism, essential tremor  Plan: - Initiated primidone at low dose for symptomatic management - Coordination with psychiatry regarding medication adjustment - Consider brain MRI pending insurance coverage - Monitor response to treatment at follow-up Lack of appetite Assessment: - Progressive appetite loss over 3-4 months - Likely medication-induced - Total weight loss from 330 to 253 lbs over extended period - Patient satisfied with weight loss but concerned about health implications  Plan: - Monitor weight and nutritional status - Coordinate with psychiatry regarding medication adjustments - Lab work ordered to evaluate for underlying causes - Follow-up in one month to reassess Dementia with anxiety, unspecified dementia severity, unspecified dementia type (HCC) Assessment: - Stable, mild - No acute changes noted - Medication adjustments must consider cognitive status  Plan: - Continue current management - Monitor for medication side effects - Ensure safety with new medication additions Anemia, unspecified type Assessment: - Requires monitoring - Labs ordered to evaluate current status  Plan: - Complete blood work including CBC, B12, folate,  ferritin - Follow up on results - Adjust management based on findings Bipolar affective disorder, currently depressed, mild (HCC) Assessment: - Significantly improved on current medication regimen - Patient reports best mental health status in years - Risk vs benefit assessment favors maintaining current psychiatric medications  Plan: - Continue current psychiatric medications - Maintain follow-up with psychiatrist - Consider medication adjustments only if tremor/appetite symptoms worsen - Continue participation in anxiety management classes     Orders Placed During this Encounter:   Diagnoses and all orders for this visit: Lack of appetite Tremor  Recommended follow-up: 1 month  Future Appointments  Date Time Provider Department Center  05/24/2023 11:30 AM Bridgett Larsson, LCSW [facility] None  Patient Care Team: [name], MD as PCP - General (Internal Medicine) Quintella Reichert, MD as PCP - Cardiology (Cardiology) Nita Sells, MD (Dermatology) [facility], [facility] (Behavioral Health) [name] (Psychiatry)    SUBJECTIVE: 72 y.o. male who has Bipolar affective disorder, depressed (HCC); Chronic bronchitis (HCC); Dementia (HCC); Hyperlipidemia; Hyperkalemia; DCM (dilated cardiomyopathy) (HCC); OSA treated with BiPAP; HFrEF (heart failure with reduced ejection fraction) (HCC); Ascending aorta dilatation (HCC); Alcoholism in remission (HCC); Benign prostatic hyperplasia with urinary obstruction; PAC (premature atrial contraction); Primary insomnia; PUD (peptic ulcer disease); Generalized arthritis; Morbid obesity (HCC); Urinary incontinence; Hypertension; Limp; Gynecomastia, male; Chronic back pain; Stage 3 chronic kidney disease (HCC); Anemia, normocytic normochromic; Tremor; and Lack of appetite on their problem list.  Main reasons for visit/main concerns/chief complaint: 2 month follow-up (Planned for depression/anxiety, both  of which much better), No appetite (3-4 months ), and Unintentional weight loss    AI-Extracted: Discussed the use of AI scribe software for clinical note transcription with the patient, who gave verbal consent to proceed.  History of Present Illness 72 year old male with history of bipolar disorder,  very mild dementia, and multiple medical comorbidities presents for follow-up with two primary concerns: progressive appetite loss and new-onset tremor. Over the past 3-4 months, patient has experienced marked decrease in appetite with associated unintentional weight loss, reporting complete lack of desire for food. Concurrent with appetite changes, patient developed a tremor approximately 2-3 months ago. Notably, onset of both symptoms coincides with psychiatric medication adjustments made by his psychiatrist approximately 3 months ago, though patient cannot recall specific medication changes. Patient reports significant improvement in previously severe depression and anxiety symptoms following recent medication adjustments. Currently participating in anxiety management classes and reports feeling "so much better...so calm," describing this as "a different world I haven't faced in a long time." Despite tremor and appetite changes, patient notes this is the best he has felt in years. Weight has decreased from 330 lbs to current 253 lbs over an extended period. While patient is not concerned about the weight loss itself, he questions whether the rate of loss is healthy. The tremor, while not constantly present, is observed to be severe during examination. Patient is able to maintain activities of daily living despite tremor. Relevant history includes remote alcohol use disorder, now in sustained remission for approximately 15 years. Patient continues using adult incontinence products for chronic urinary incontinence and declines further urologic evaluation, accepting this as "part of life."   Note that patient   has a past medical history of Allergy (seasonal), Ascending aorta dilatation (HCC), Cancer (HCC) (skin), DCM (dilated cardiomyopathy) (HCC), Dementia (HCC), Depression, Hyperlipidemia, Lithium toxicity (01/29/2016), OSA treated with BiPAP, SKIN CANCER, HX OF (05/10/2007), and Urinary dribbling (03/14/2022).  Problem list overviews that were updated at today's visit: Problem  Tremor  Lack of Appetite    Med reconciliation: Current Outpatient Medications on File Prior to Visit  Medication Sig   busPIRone (BUSPAR) 10 MG tablet Take 10 mg by mouth 3 (three) times daily.   calcium-vitamin D (OSCAL WITH D) 500-5 MG-MCG tablet Take 1 tablet by mouth daily with breakfast.   carvedilol (COREG) 12.5 MG tablet Take 1 tablet (12.5 mg total) by mouth 2 (two) times daily with a meal.   divalproex (DEPAKOTE ER) 250 MG 24 hr tablet Take 250 mg by mouth at bedtime.   folic acid (FOLVITE) 1 MG tablet Take 1 tablet (1 mg total) by mouth daily.   LORazepam (ATIVAN) 1 MG tablet Take one tablet by mouth in the morning and two tablets at bedtime   losartan (COZAAR) 50 MG tablet Take 1 tablet (50 mg total) by mouth daily. Replaces entresto, start after done with entresto   ondansetron (ZOFRAN) 4 MG tablet Take 1 tablet (4 mg total) by mouth every 8 (eight) hours as needed for nausea or vomiting.   pravastatin (PRAVACHOL) 20 MG tablet Take 1 tablet (20 mg total) by mouth at bedtime.   tamsulosin (FLOMAX) 0.4 MG CAPS capsule TAKE 1 CAPSULE BY MOUTH EVERY DAY   traZODone (DESYREL) 100 MG tablet Take 300 mg by mouth at bedtime.   venlafaxine XR (EFFEXOR-XR) 150 MG 24 hr capsule Take 150 mg by mouth daily with breakfast.   No current facility-administered medications on file prior to visit.  There are no discontinued medications.   Objective   Physical Exam     05/08/2023    1:16 PM 03/07/2023    1:01 PM 02/25/2023    3:33 PM  Vitals with BMI  Height 5\' 6"  5\' 6"  5\' 6"   Weight 241 lbs 256 lbs 6 oz 255 lbs  3 oz   BMI 38.92 41.4 41.21  Systolic 103 131 098  Diastolic 65 85 80  Pulse 79 87 85   Wt Readings from Last 10 Encounters:  05/08/23 241 lb (109.3 kg)  03/07/23 256 lb 6.4 oz (116.3 kg)  02/25/23 255 lb 3.2 oz (115.8 kg)  01/15/23 258 lb 9.6 oz (117.3 kg)  12/19/22 265 lb 3.2 oz (120.3 kg)  11/21/22 267 lb 3.2 oz (121.2 kg)  07/18/22 279 lb 9.6 oz (126.8 kg)  05/14/22 282 lb 3.2 oz (128 kg)  05/04/22 282 lb 12.8 oz (128.3 kg)  03/14/22 280 lb 3.2 oz (127.1 kg)   Vital signs reviewed.  Nursing notes reviewed. Weight trend reviewed. Abnormalities and Problem-Specific physical exam findings:  doesn't recall medications, but decent at telling history weight loss is real. Wt Readings from Last 10 Encounters:  05/08/23 241 lb (109.3 kg)  03/07/23 256 lb 6.4 oz (116.3 kg)  02/25/23 255 lb 3.2 oz (115.8 kg)  01/15/23 258 lb 9.6 oz (117.3 kg)  12/19/22 265 lb 3.2 oz (120.3 kg)  11/21/22 267 lb 3.2 oz (121.2 kg)  07/18/22 279 lb 9.6 oz (126.8 kg)  05/14/22 282 lb 3.2 oz (128 kg)  05/04/22 282 lb 12.8 oz (128.3 kg)  03/14/22 280 lb 3.2 oz (127.1 kg)       General Appearance:  No acute distress appreciable.   Well-groomed, healthy-appearing male.  Well proportioned with no abnormal fat distribution.  Good muscle tone. Pulmonary:  Normal work of breathing at rest, no respiratory distress apparent. SpO2: 96 %  Musculoskeletal: All extremities are intact.  Neurological:  Awake, alert, oriented, and engaged.  No obvious focal neurological deficits or cognitive impairments.  Sensorium seems unclouded.   Speech is clear and coherent with logical content. Psychiatric:  Appropriate mood, pleasant and cooperative demeanor, thoughtful and engaged during the exam  Results            No results found for any visits on 05/08/23.  Appointment on 03/15/2023  Component Date Value   Area-P 1/2 03/15/2023 5.42    S' Lateral 03/15/2023 3.60    Est EF 03/15/2023 35 - 40%   Office Visit on 01/15/2023   Component Date Value   WBC 01/15/2023 8.5    RBC 01/15/2023 4.28    Hemoglobin 01/15/2023 12.5 (L)    HCT 01/15/2023 38.7 (L)    MCV 01/15/2023 90.3    MCHC 01/15/2023 32.2    RDW 01/15/2023 13.8    Platelets 01/15/2023 187.0    Neutrophils Relative % 01/15/2023 69.9    Lymphocytes Relative 01/15/2023 16.8    Monocytes Relative 01/15/2023 11.3    Eosinophils Relative 01/15/2023 1.4    Basophils Relative 01/15/2023 0.6    Neutro Abs 01/15/2023 5.9    Lymphs Abs 01/15/2023 1.4    Monocytes Absolute 01/15/2023 1.0    Eosinophils Absolute 01/15/2023 0.1    Basophils Absolute 01/15/2023 0.1    Sodium 01/15/2023 138    Potassium 01/15/2023 4.1    Chloride 01/15/2023 97    CO2 01/15/2023 33 (H)    Glucose, Bld 01/15/2023 112 (H)    BUN 01/15/2023 15    Creatinine, Ser 01/15/2023 1.16    Total Bilirubin 01/15/2023 0.5    Alkaline Phosphatase 01/15/2023 54    AST 01/15/2023 15    ALT 01/15/2023 9    Total Protein 01/15/2023 6.9    Albumin 01/15/2023 3.8    GFR 01/15/2023 62.20    Calcium 01/15/2023 9.3  Lipase 01/15/2023 15.0   Office Visit on 11/21/2022  Component Date Value   Sodium 11/21/2022 141    Potassium 11/21/2022 4.5    Chloride 11/21/2022 102    CO2 11/21/2022 31    Glucose, Bld 11/21/2022 106 (H)    BUN 11/21/2022 16    Creatinine, Ser 11/21/2022 1.12    GFR 11/21/2022 64.95    Calcium 11/21/2022 9.2    Vitamin B-12 11/21/2022 305    Folate 11/21/2022 >23.8    TSH 11/21/2022 0.968    Hgb A1c MFr Bld 11/21/2022 5.4   Lab on 08/29/2022  Component Date Value   Valproic Acid Lvl 08/29/2022 29.3 (L)    TSH 08/29/2022 1.42    HIV 1&2 Ab, 4th Generati* 08/29/2022 NON-REACTIVE    Hepatitis C Ab 08/29/2022 NON-REACTIVE    Cholesterol 08/29/2022 138    Triglycerides 08/29/2022 138.0    HDL 08/29/2022 40.80    VLDL 08/29/2022 27.6    LDL Cholesterol 08/29/2022 69    Total CHOL/HDL Ratio 08/29/2022 3    NonHDL 08/29/2022 96.89    Sodium 08/29/2022 140     Potassium 08/29/2022 5.4 No hemolysis seen (H)    Chloride 08/29/2022 101    CO2 08/29/2022 31    Glucose, Bld 08/29/2022 107 (H)    BUN 08/29/2022 20    Creatinine, Ser 08/29/2022 1.14    Total Bilirubin 08/29/2022 0.5    Alkaline Phosphatase 08/29/2022 71    AST 08/29/2022 16    ALT 08/29/2022 13    Total Protein 08/29/2022 7.3    Albumin 08/29/2022 4.3    GFR 08/29/2022 63.69    Calcium 08/29/2022 9.3    WBC 08/29/2022 7.1    RBC 08/29/2022 4.62    Hemoglobin 08/29/2022 13.7    HCT 08/29/2022 41.4    MCV 08/29/2022 89.5    MCHC 08/29/2022 33.2    RDW 08/29/2022 14.5    Platelets 08/29/2022 170.0    Neutrophils Relative % 08/29/2022 69.3    Lymphocytes Relative 08/29/2022 20.9    Monocytes Relative 08/29/2022 6.9    Eosinophils Relative 08/29/2022 2.1    Basophils Relative 08/29/2022 0.8    Neutro Abs 08/29/2022 4.9    Lymphs Abs 08/29/2022 1.5    Monocytes Absolute 08/29/2022 0.5    Eosinophils Absolute 08/29/2022 0.2    Basophils Absolute 08/29/2022 0.1    No image results found.   ECHOCARDIOGRAM COMPLETE  Result Date: 03/15/2023    ECHOCARDIOGRAM REPORT   Patient Name:   MADDUX SZOT   Date of Exam: 03/15/2023 Medical Rec #:  [id number]     Height:       66.0 in Accession #:    [id number]    Weight:       256.4 lb Date of Birth:  [birth year]     BSA:          2.222 m Patient Age:    72 years      BP:           131/85 mmHg Patient Gender: M             HR:           82 bpm. Exam Location:  [address] Procedure: 2D Echo, Cardiac Doppler and Color Doppler Indications:    I50.20 Heart Failure with Reduced EF  History:        Patient has prior history of Echocardiogram examinations, most  recent 04/14/2021. Risk Factors:Dyslipidemia. Obstructive sleep                 apnea. Dilated Cardiomyopathy. Ascending Aorta Dilatation.  Sonographer:    Daphine Deutscher RDCS Referring Phys: [id number] Evern Bio WEAVER IMPRESSIONS  1. Left ventricular ejection fraction, by  estimation, is 35 to 40%. The left ventricle has moderately decreased function. The left ventricle has no regional wall motion abnormalities. Left ventricular diastolic parameters are consistent with Grade I diastolic dysfunction (impaired relaxation).  2. Right ventricular systolic function is normal. The right ventricular size is normal.  3. The mitral valve is normal in structure. Trivial mitral valve regurgitation. No evidence of mitral stenosis.  4. The aortic valve is tricuspid. There is mild calcification of the aortic valve. Aortic valve regurgitation is not visualized. Aortic valve sclerosis/calcification is present, without any evidence of aortic stenosis.  5. Aortic dilatation noted. There is mild dilatation of the ascending aorta, measuring 40 mm.  6. The inferior vena cava is normal in size with greater than 50% respiratory variability, suggesting right atrial pressure of 3 mmHg. FINDINGS  Left Ventricle: Left ventricular ejection fraction, by estimation, is 35 to 40%. The left ventricle has moderately decreased function. The left ventricle has no regional wall motion abnormalities. The left ventricular internal cavity size was normal in size. There is no left ventricular hypertrophy. Left ventricular diastolic parameters are consistent with Grade I diastolic dysfunction (impaired relaxation). Right Ventricle: The right ventricular size is normal. No increase in right ventricular wall thickness. Right ventricular systolic function is normal. Left Atrium: Left atrial size was normal in size. Right Atrium: Right atrial size was normal in size. Pericardium: There is no evidence of pericardial effusion. Mitral Valve: The mitral valve is normal in structure. Trivial mitral valve regurgitation. No evidence of mitral valve stenosis. Tricuspid Valve: The tricuspid valve is normal in structure. Tricuspid valve regurgitation is not demonstrated. No evidence of tricuspid stenosis. Aortic Valve: The aortic valve is  tricuspid. There is mild calcification of the aortic valve. Aortic valve regurgitation is not visualized. Aortic valve sclerosis/calcification is present, without any evidence of aortic stenosis. Pulmonic Valve: The pulmonic valve was normal in structure. Pulmonic valve regurgitation is not visualized. No evidence of pulmonic stenosis. Aorta: Aortic dilatation noted. There is mild dilatation of the ascending aorta, measuring 40 mm. Venous: The inferior vena cava is normal in size with greater than 50% respiratory variability, suggesting right atrial pressure of 3 mmHg. IAS/Shunts: No atrial level shunt detected by color flow Doppler.  LEFT VENTRICLE PLAX 2D LVIDd:         5.00 cm   Diastology LVIDs:         3.60 cm   LV e' medial:    8.83 cm/s LV PW:         1.00 cm   LV E/e' medial:  6.4 LV IVS:        1.00 cm   LV e' lateral:   9.32 cm/s LVOT diam:     2.40 cm   LV E/e' lateral: 6.0 LV SV:         54 LV SV Index:   24 LVOT Area:     4.52 cm  RIGHT VENTRICLE RV Basal diam:  4.00 cm RV S prime:     13.17 cm/s TAPSE (M-mode): 1.6 cm LEFT ATRIUM             Index        RIGHT ATRIUM  Index LA diam:        4.70 cm 2.11 cm/m   RA Area:     14.50 cm LA Vol (A2C):   55.1 ml 24.79 ml/m  RA Volume:   38.40 ml  17.28 ml/m LA Vol (A4C):   27.4 ml 12.33 ml/m LA Biplane Vol: 40.5 ml 18.22 ml/m  AORTIC VALVE LVOT Vmax:   73.00 cm/s LVOT Vmean:  48.200 cm/s LVOT VTI:    0.119 m  AORTA Ao Root diam: 3.70 cm Ao Asc diam:  4.00 cm MITRAL VALVE MV Area (PHT): 5.42 cm    SHUNTS MV Decel Time: 140 msec    Systemic VTI:  0.12 m MV E velocity: 56.10 cm/s  Systemic Diam: 2.40 cm MV A velocity: 87.00 cm/s MV E/A ratio:  0.64 Arvilla Meres MD Electronically signed by Arvilla Meres MD Signature Date/Time: 03/15/2023/1:08:57 PM    Final     DG UGI W DOUBLE CM (HD BA)  Result Date: 05/03/2021 CLINICAL DATA:  New onset nausea with eating. EXAM: UPPER GI SERIES WITH KUB TECHNIQUE: After obtaining a scout radiograph a  routine upper GI series was performed using thin and high density barium. FLUOROSCOPY TIME:  Fluoroscopy Time:  1 minute 48 seconds Radiation Exposure Index (if provided by the fluoroscopic device): 54.4 mGy Number of Acquired Spot Images: 5 COMPARISON:  None. FINDINGS: Scout view of the abdomen shows a normal bowel gas pattern. No unexpected radiopaque calculi. Single swallow evaluation shows delayed initiation with a fair amount of residual contrast in the oropharynx. Sluggish esophageal motility with tertiary contractions. No esophageal fold thickening, stricture or obstruction. Tiny hiatal hernia. Stomach and duodenal bulb are unremarkable. IMPRESSION: 1. Mild esophageal dysmotility. 2. Tiny hiatal hernia. Electronically Signed   By: Leanna Battles M.D.   On: 05/03/2021 10:27       # Medical Decision Making Documentation  Level of Service: [phone] (Based on Time)  Time Attestation Statement: I personally spent 32 minutes face-to-face with the patient in the following activities: - Obtaining and documenting an updated history regarding tremor and appetite changes - Performing a focused neurological examination - Reviewing and assessing current psychiatric medication regimen - Attempting to contact wife as independent historian for medication verification - Reviewing historical records about recent medication changes - Discussing diagnosis and treatment options with patient - Prescribing new medication (primidone) for tremor management - Providing patient education about medication effects and monitoring - Coordinating care plan with need for psychiatry follow-up - Ordering and explaining laboratory studies  The extended duration of this visit was medically necessary due to: 1. Complexity of evaluating new tremor in context of multiple psychiatric medications 2. Need for attempted contact with independent historian 3. Time required to explain treatment approach balancing tremor management  with psychiatric stability 4. Care coordination needs between primary care and psychiatry  Documentation of Medical Necessity: - Multiple chronic conditions requiring careful medication adjustment - New symptoms requiring diagnostic evaluation - Complex care coordination between primary care and psychiatry - Need for careful medication management in context of psychiatric stability  [id number] Support: As PCP, serving as continuing focal point for all needed services, including: - Ongoing management of multiple chronic conditions - Coordination with psychiatry for medication management - Regular monitoring of medication effects and side effects - Integration of care across multiple providers and conditions  Additional Info: This encounter employed real-time, collaborative documentation. The patient actively reviewed and updated their medical record on a shared screen, ensuring transparency and facilitating joint problem-solving  for the problem list, overview, and plan. This approach promotes accurate, informed care. The treatment plan was discussed and reviewed in detail, including medication safety, potential side effects, and all patient questions. We confirmed understanding and comfort with the plan. Follow-up instructions were established, including contacting the office for any concerns, returning if symptoms worsen, persist, or new symptoms develop, and precautions for potential emergency department visits.

## 2023-05-08 NOTE — Assessment & Plan Note (Addendum)
Presumed Medication-induced tremor management initiated with primidone, targeting symptomatic relief while maintaining current psychiatric medication stability.  Assessment: - New-onset severe tremor, likely medication-induced - Temporally associated with psychiatric medication changes - Impact on quality of life noted but not severely limiting ADLs - Differential includes medication-induced parkinsonism, essential tremor  Plan: - Initiated primidone at low dose for symptomatic management - Coordination with psychiatry regarding medication adjustment - Consider brain MRI pending insurance coverage - Monitor response to treatment at follow-up

## 2023-05-08 NOTE — Patient Instructions (Addendum)
Patient Instructions Key Points for Today's Visit:  New Medication:  Started primidone for tremor Take as prescribed Watch for side effects like drowsiness or unsteady walking Call if side effects are bothersome   Follow-up Tasks:  Complete ordered blood work before next visit Contact psychiatrist to discuss medication changes Schedule cardiology follow-up for echo Return to clinic in one month   Warning Signs:  Call if tremor significantly worsens Seek immediate care if:  Severe dizziness develops Falls occur Any concerning new symptoms     Lifestyle Instructions:  Continue participation in anxiety classes Maintain current activities as tolerated No specific dietary restrictions   Next Steps:  Discuss MRI and additional testing with family Consider insurance coverage for upcoming tests Schedule follow-up appointments as noted above    Remember: Your mental health improvements are significant and valuable. We will work to address the tremor while maintaining these gains.    Key Observations:  Patient reports new tremor and reduced appetite over past few months Patient has diagnosed dementia, which may complicate medication management Patient believes current medications might be causing these symptoms   Immediate Plan:  Medication Review  Comprehensive medication reconciliation is critical Patient will provide complete medication list at next visit We will carefully evaluate medication for potential side effects related to tremor and appetite changes   Potential Next Steps  Medication adjustment may resolve current symptoms If symptoms persist after medication review, we will:  Consider neurological consultation Discuss diagnostic testing (blood work, brain imaging) I would love to get some blood work for to have ordered and an MRI but these are expensive so he would like to discuss with wife first Assess need for further  evaluation  Recommendations for Family:  Assist patient in gathering complete medication list Note any recent medication changes Track frequency and characteristics of tremor Monitor appetite and any weight changes Prepare list of questions for next appointment

## 2023-05-08 NOTE — Assessment & Plan Note (Signed)
Assessment: - Significantly improved on current medication regimen - Patient reports best mental health status in years - Risk vs benefit assessment favors maintaining current psychiatric medications  Plan: - Continue current psychiatric medications - Maintain follow-up with psychiatrist - Consider medication adjustments only if tremor/appetite symptoms worsen - Continue participation in anxiety management classes

## 2023-05-08 NOTE — Assessment & Plan Note (Signed)
Assessment: - Progressive appetite loss over 3-4 months - Likely medication-induced - Total weight loss from 330 to 253 lbs over extended period - Patient satisfied with weight loss but concerned about health implications  Plan: - Monitor weight and nutritional status - Coordinate with psychiatry regarding medication adjustments - Lab work ordered to evaluate for underlying causes - Follow-up in one month to reassess

## 2023-05-08 NOTE — Assessment & Plan Note (Signed)
Assessment: - Stable, mild - No acute changes noted - Medication adjustments must consider cognitive status  Plan: - Continue current management - Monitor for medication side effects - Ensure safety with new medication additions

## 2023-05-09 LAB — PATHOLOGIST SMEAR REVIEW

## 2023-05-09 LAB — RETICULOCYTES
ABS Retic: 73000 {cells}/uL (ref 25000–90000)
Retic Ct Pct: 2 %

## 2023-05-12 ENCOUNTER — Other Ambulatory Visit: Payer: Self-pay

## 2023-05-12 ENCOUNTER — Emergency Department (HOSPITAL_COMMUNITY): Payer: Medicare HMO

## 2023-05-12 ENCOUNTER — Inpatient Hospital Stay (HOSPITAL_COMMUNITY)
Admission: EM | Admit: 2023-05-12 | Discharge: 2023-05-16 | DRG: 871 | Disposition: A | Discharge status: Home Health | Payer: Medicare HMO | Attending: Internal Medicine | Admitting: Internal Medicine

## 2023-05-12 ENCOUNTER — Encounter: Payer: Self-pay | Admitting: Internal Medicine

## 2023-05-12 DIAGNOSIS — K5730 Diverticulosis of large intestine without perforation or abscess without bleeding: Secondary | ICD-10-CM | POA: Diagnosis not present

## 2023-05-12 DIAGNOSIS — B9620 Unspecified Escherichia coli [E. coli] as the cause of diseases classified elsewhere: Secondary | ICD-10-CM | POA: Diagnosis not present

## 2023-05-12 DIAGNOSIS — R197 Diarrhea, unspecified: Secondary | ICD-10-CM

## 2023-05-12 DIAGNOSIS — Z79899 Other long term (current) drug therapy: Secondary | ICD-10-CM

## 2023-05-12 DIAGNOSIS — E66812 Obesity, class 2: Secondary | ICD-10-CM

## 2023-05-12 DIAGNOSIS — G4733 Obstructive sleep apnea (adult) (pediatric): Secondary | ICD-10-CM | POA: Diagnosis present

## 2023-05-12 DIAGNOSIS — W19XXXA Unspecified fall, initial encounter: Secondary | ICD-10-CM | POA: Diagnosis present

## 2023-05-12 DIAGNOSIS — R404 Transient alteration of awareness: Secondary | ICD-10-CM | POA: Diagnosis not present

## 2023-05-12 DIAGNOSIS — D71 Functional disorders of polymorphonuclear neutrophils: Secondary | ICD-10-CM | POA: Diagnosis not present

## 2023-05-12 DIAGNOSIS — I5020 Unspecified systolic (congestive) heart failure: Secondary | ICD-10-CM | POA: Diagnosis not present

## 2023-05-12 DIAGNOSIS — G252 Other specified forms of tremor: Secondary | ICD-10-CM | POA: Diagnosis present

## 2023-05-12 DIAGNOSIS — I6782 Cerebral ischemia: Secondary | ICD-10-CM | POA: Diagnosis not present

## 2023-05-12 DIAGNOSIS — A4151 Sepsis due to Escherichia coli [E. coli]: Principal | ICD-10-CM | POA: Diagnosis present

## 2023-05-12 DIAGNOSIS — E871 Hypo-osmolality and hyponatremia: Secondary | ICD-10-CM

## 2023-05-12 DIAGNOSIS — I110 Hypertensive heart disease with heart failure: Secondary | ICD-10-CM | POA: Diagnosis present

## 2023-05-12 DIAGNOSIS — F0390 Unspecified dementia without behavioral disturbance: Secondary | ICD-10-CM | POA: Diagnosis present

## 2023-05-12 DIAGNOSIS — F0393 Unspecified dementia, unspecified severity, with mood disturbance: Secondary | ICD-10-CM | POA: Diagnosis not present

## 2023-05-12 DIAGNOSIS — G9341 Metabolic encephalopathy: Secondary | ICD-10-CM | POA: Diagnosis not present

## 2023-05-12 DIAGNOSIS — R0689 Other abnormalities of breathing: Secondary | ICD-10-CM | POA: Diagnosis not present

## 2023-05-12 DIAGNOSIS — F319 Bipolar disorder, unspecified: Secondary | ICD-10-CM | POA: Diagnosis present

## 2023-05-12 DIAGNOSIS — N390 Urinary tract infection, site not specified: Secondary | ICD-10-CM

## 2023-05-12 DIAGNOSIS — D638 Anemia in other chronic diseases classified elsewhere: Secondary | ICD-10-CM

## 2023-05-12 DIAGNOSIS — I951 Orthostatic hypotension: Secondary | ICD-10-CM | POA: Diagnosis present

## 2023-05-12 DIAGNOSIS — E860 Dehydration: Secondary | ICD-10-CM | POA: Diagnosis present

## 2023-05-12 DIAGNOSIS — I5022 Chronic systolic (congestive) heart failure: Secondary | ICD-10-CM | POA: Diagnosis not present

## 2023-05-12 DIAGNOSIS — Y92009 Unspecified place in unspecified non-institutional (private) residence as the place of occurrence of the external cause: Secondary | ICD-10-CM

## 2023-05-12 DIAGNOSIS — D649 Anemia, unspecified: Secondary | ICD-10-CM | POA: Diagnosis not present

## 2023-05-12 DIAGNOSIS — Z6838 Body mass index (BMI) 38.0-38.9, adult: Secondary | ICD-10-CM | POA: Diagnosis not present

## 2023-05-12 DIAGNOSIS — R55 Syncope and collapse: Secondary | ICD-10-CM | POA: Diagnosis present

## 2023-05-12 DIAGNOSIS — Z85828 Personal history of other malignant neoplasm of skin: Secondary | ICD-10-CM | POA: Diagnosis not present

## 2023-05-12 DIAGNOSIS — E785 Hyperlipidemia, unspecified: Secondary | ICD-10-CM | POA: Diagnosis present

## 2023-05-12 DIAGNOSIS — R7881 Bacteremia: Secondary | ICD-10-CM | POA: Diagnosis not present

## 2023-05-12 DIAGNOSIS — N400 Enlarged prostate without lower urinary tract symptoms: Secondary | ICD-10-CM | POA: Diagnosis present

## 2023-05-12 DIAGNOSIS — I420 Dilated cardiomyopathy: Secondary | ICD-10-CM | POA: Diagnosis present

## 2023-05-12 DIAGNOSIS — Z87442 Personal history of urinary calculi: Secondary | ICD-10-CM

## 2023-05-12 DIAGNOSIS — Z8249 Family history of ischemic heart disease and other diseases of the circulatory system: Secondary | ICD-10-CM

## 2023-05-12 DIAGNOSIS — N3000 Acute cystitis without hematuria: Secondary | ICD-10-CM | POA: Diagnosis present

## 2023-05-12 DIAGNOSIS — D509 Iron deficiency anemia, unspecified: Secondary | ICD-10-CM | POA: Diagnosis not present

## 2023-05-12 DIAGNOSIS — E669 Obesity, unspecified: Secondary | ICD-10-CM

## 2023-05-12 DIAGNOSIS — R6520 Severe sepsis without septic shock: Secondary | ICD-10-CM | POA: Diagnosis present

## 2023-05-12 DIAGNOSIS — Z043 Encounter for examination and observation following other accident: Secondary | ICD-10-CM | POA: Diagnosis not present

## 2023-05-12 DIAGNOSIS — E861 Hypovolemia: Secondary | ICD-10-CM | POA: Diagnosis present

## 2023-05-12 DIAGNOSIS — N3080 Other cystitis without hematuria: Secondary | ICD-10-CM | POA: Diagnosis present

## 2023-05-12 DIAGNOSIS — N189 Chronic kidney disease, unspecified: Secondary | ICD-10-CM

## 2023-05-12 DIAGNOSIS — E872 Acidosis, unspecified: Secondary | ICD-10-CM | POA: Diagnosis present

## 2023-05-12 DIAGNOSIS — N179 Acute kidney failure, unspecified: Principal | ICD-10-CM

## 2023-05-12 DIAGNOSIS — R4182 Altered mental status, unspecified: Secondary | ICD-10-CM | POA: Diagnosis not present

## 2023-05-12 DIAGNOSIS — G319 Degenerative disease of nervous system, unspecified: Secondary | ICD-10-CM | POA: Diagnosis not present

## 2023-05-12 DIAGNOSIS — N1330 Unspecified hydronephrosis: Secondary | ICD-10-CM | POA: Diagnosis not present

## 2023-05-12 DIAGNOSIS — R109 Unspecified abdominal pain: Secondary | ICD-10-CM | POA: Diagnosis not present

## 2023-05-12 LAB — CBG MONITORING, ED: Glucose-Capillary: 137 mg/dL — ABNORMAL HIGH (ref 70–99)

## 2023-05-12 LAB — I-STAT CG4 LACTIC ACID, ED: Lactic Acid, Venous: 2.5 mmol/L (ref 0.5–1.9)

## 2023-05-12 NOTE — Progress Notes (Signed)
Reviewed CBC with smear from 05/08/23. Hemoglobin 10.1, ferritin >1500, normal B12 and folate. Smear shows borderline microcytic/hypochromic RBCs with mild left shift and reactive changes. Elevated sed rate 60. Findings concerning for anemia of chronic disease vs inflammation. Will monitor closely given cardiac history and psychiatric medication regimen. Plan to repeat CBC with iron studies in 4 weeks. Patient education provided regarding warning symptoms.

## 2023-05-12 NOTE — ED Triage Notes (Signed)
Patient arrived with EMS from home , unwitnessed fall / found on the floor by family , denies injury or pain , CBG=132. Patient unable to recall incident .

## 2023-05-13 ENCOUNTER — Other Ambulatory Visit (HOSPITAL_COMMUNITY): Payer: Medicare HMO

## 2023-05-13 ENCOUNTER — Emergency Department (HOSPITAL_COMMUNITY): Payer: Medicare HMO

## 2023-05-13 ENCOUNTER — Encounter (HOSPITAL_COMMUNITY): Payer: Self-pay | Admitting: Internal Medicine

## 2023-05-13 ENCOUNTER — Observation Stay (HOSPITAL_BASED_OUTPATIENT_CLINIC_OR_DEPARTMENT_OTHER): Payer: Medicare HMO

## 2023-05-13 DIAGNOSIS — E871 Hypo-osmolality and hyponatremia: Secondary | ICD-10-CM

## 2023-05-13 DIAGNOSIS — E669 Obesity, unspecified: Secondary | ICD-10-CM

## 2023-05-13 DIAGNOSIS — R197 Diarrhea, unspecified: Secondary | ICD-10-CM

## 2023-05-13 DIAGNOSIS — I5020 Unspecified systolic (congestive) heart failure: Secondary | ICD-10-CM

## 2023-05-13 DIAGNOSIS — G319 Degenerative disease of nervous system, unspecified: Secondary | ICD-10-CM | POA: Diagnosis not present

## 2023-05-13 DIAGNOSIS — N3000 Acute cystitis without hematuria: Secondary | ICD-10-CM

## 2023-05-13 DIAGNOSIS — N189 Chronic kidney disease, unspecified: Secondary | ICD-10-CM

## 2023-05-13 DIAGNOSIS — R4182 Altered mental status, unspecified: Secondary | ICD-10-CM | POA: Diagnosis not present

## 2023-05-13 DIAGNOSIS — N179 Acute kidney failure, unspecified: Secondary | ICD-10-CM

## 2023-05-13 DIAGNOSIS — R55 Syncope and collapse: Secondary | ICD-10-CM

## 2023-05-13 DIAGNOSIS — E66812 Obesity, class 2: Secondary | ICD-10-CM

## 2023-05-13 DIAGNOSIS — I6782 Cerebral ischemia: Secondary | ICD-10-CM | POA: Diagnosis not present

## 2023-05-13 HISTORY — DX: Acute kidney failure, unspecified: N17.9

## 2023-05-13 HISTORY — DX: Hypo-osmolality and hyponatremia: E87.1

## 2023-05-13 HISTORY — DX: Diarrhea, unspecified: R19.7

## 2023-05-13 HISTORY — DX: Chronic kidney disease, unspecified: N17.9

## 2023-05-13 HISTORY — DX: Acute cystitis without hematuria: N30.00

## 2023-05-13 HISTORY — DX: Syncope and collapse: R55

## 2023-05-13 HISTORY — DX: Obesity, unspecified: E66.9

## 2023-05-13 LAB — BLOOD CULTURE ID PANEL (REFLEXED) - BCID2

## 2023-05-13 LAB — URINALYSIS, W/ REFLEX TO CULTURE (INFECTION SUSPECTED)
Bilirubin Urine: NEGATIVE
Glucose, UA: NEGATIVE mg/dL
Ketones, ur: NEGATIVE mg/dL
Nitrite: NEGATIVE
Protein, ur: 100 mg/dL — AB
Specific Gravity, Urine: 1.009 (ref 1.005–1.030)
WBC, UA: >50 WBC/hpf (ref 0–5)
pH: 7 (ref 5.0–8.0)

## 2023-05-13 LAB — ECHOCARDIOGRAM COMPLETE
AR max vel: 2.75 cm2
AV Area VTI: 2.71 cm2
AV Area mean vel: 2.66 cm2
AV Mean grad: 4 mm[Hg]
AV Peak grad: 6.4 mm[Hg]
Ao pk vel: 1.26 m/s
Area-P 1/2: 3.93 cm2
Calc EF: 51.2 %
S' Lateral: 3.9 cm
Single Plane A2C EF: 49.6 %
Single Plane A4C EF: 51.8 %

## 2023-05-13 LAB — CBC
HCT: 28.4 % — ABNORMAL LOW (ref 39.0–52.0)
Hemoglobin: 8.5 g/dL — ABNORMAL LOW (ref 13.0–17.0)
MCH: 27.1 pg (ref 26.0–34.0)
MCHC: 29.9 g/dL — ABNORMAL LOW (ref 30.0–36.0)
MCV: 90.4 fL (ref 80.0–100.0)
Platelets: 194 10*3/uL (ref 150–400)
RBC: 3.14 MIL/uL — ABNORMAL LOW (ref 4.22–5.81)
RDW: 14.6 % (ref 11.5–15.5)
WBC: 11.9 10*3/uL — ABNORMAL HIGH (ref 4.0–10.5)
nRBC: 0 % (ref 0.0–0.2)

## 2023-05-13 LAB — FERRITIN: Ferritin: 1430 ng/mL — ABNORMAL HIGH (ref 24–336)

## 2023-05-13 LAB — BASIC METABOLIC PANEL
Anion gap: 15 (ref 5–15)
BUN: 24 mg/dL — ABNORMAL HIGH (ref 8–23)
CO2: 25 mmol/L (ref 22–32)
Calcium: 7.7 mg/dL — ABNORMAL LOW (ref 8.9–10.3)
Chloride: 99 mmol/L (ref 98–111)
Creatinine, Ser: 1.67 mg/dL — ABNORMAL HIGH (ref 0.61–1.24)
GFR, Estimated: 43 mL/min — ABNORMAL LOW (ref >60)
Glucose, Bld: 108 mg/dL — ABNORMAL HIGH (ref 70–99)
Potassium: 4.2 mmol/L (ref 3.5–5.1)
Sodium: 139 mmol/L (ref 135–145)

## 2023-05-13 LAB — COMPREHENSIVE METABOLIC PANEL
ALT: 14 U/L (ref 0–44)
AST: 22 U/L (ref 15–41)
Albumin: 1.6 g/dL — ABNORMAL LOW (ref 3.5–5.0)
Alkaline Phosphatase: 51 U/L (ref 38–126)
Anion gap: 11 (ref 5–15)
BUN: 26 mg/dL — ABNORMAL HIGH (ref 8–23)
CO2: 24 mmol/L (ref 22–32)
Calcium: 8.3 mg/dL — ABNORMAL LOW (ref 8.9–10.3)
Chloride: 98 mmol/L (ref 98–111)
Creatinine, Ser: 2.04 mg/dL — ABNORMAL HIGH (ref 0.61–1.24)
GFR, Estimated: 34 mL/min — ABNORMAL LOW (ref >60)
Glucose, Bld: 130 mg/dL — ABNORMAL HIGH (ref 70–99)
Potassium: 4.3 mmol/L (ref 3.5–5.1)
Sodium: 133 mmol/L — ABNORMAL LOW (ref 135–145)
Total Bilirubin: 0.4 mg/dL (ref <1.2)
Total Protein: 6.7 g/dL (ref 6.5–8.1)

## 2023-05-13 LAB — LACTIC ACID, PLASMA: Lactic Acid, Venous: 0.8 mmol/L (ref 0.5–1.9)

## 2023-05-13 LAB — CBC WITH DIFFERENTIAL/PLATELET
Abs Immature Granulocytes: 0.15 10*3/uL — ABNORMAL HIGH (ref 0.00–0.07)
Basophils Absolute: 0.1 10*3/uL (ref 0.0–0.1)
Basophils Relative: 1 %
Eosinophils Absolute: 0 10*3/uL (ref 0.0–0.5)
Eosinophils Relative: 0 %
HCT: 27.3 % — ABNORMAL LOW (ref 39.0–52.0)
Hemoglobin: 8.6 g/dL — ABNORMAL LOW (ref 13.0–17.0)
Immature Granulocytes: 1 %
Lymphocytes Relative: 13 %
Lymphs Abs: 1.6 10*3/uL (ref 0.7–4.0)
MCH: 27.8 pg (ref 26.0–34.0)
MCHC: 31.5 g/dL (ref 30.0–36.0)
MCV: 88.3 fL (ref 80.0–100.0)
Monocytes Absolute: 1.4 10*3/uL — ABNORMAL HIGH (ref 0.1–1.0)
Monocytes Relative: 11 %
Neutro Abs: 9.2 10*3/uL — ABNORMAL HIGH (ref 1.7–7.7)
Neutrophils Relative %: 74 %
Platelets: 201 10*3/uL (ref 150–400)
RBC: 3.09 MIL/uL — ABNORMAL LOW (ref 4.22–5.81)
RDW: 14.5 % (ref 11.5–15.5)
WBC: 12.5 10*3/uL — ABNORMAL HIGH (ref 4.0–10.5)
nRBC: 0 % (ref 0.0–0.2)

## 2023-05-13 LAB — IRON AND TIBC
Iron: 18 ug/dL — ABNORMAL LOW (ref 45–182)
Saturation Ratios: 14 % — ABNORMAL LOW (ref 17.9–39.5)
TIBC: 129 ug/dL — ABNORMAL LOW (ref 250–450)
UIBC: 111 ug/dL

## 2023-05-13 LAB — BRAIN NATRIURETIC PEPTIDE: B Natriuretic Peptide: 83.1 pg/mL (ref 0.0–100.0)

## 2023-05-13 LAB — RETICULOCYTES
Immature Retic Fract: 26.9 % — ABNORMAL HIGH (ref 2.3–15.9)
RBC.: 3.12 MIL/uL — ABNORMAL LOW (ref 4.22–5.81)
Retic Count, Absolute: 61.5 10*3/uL (ref 19.0–186.0)
Retic Ct Pct: 2 % (ref 0.4–3.1)

## 2023-05-13 LAB — TROPONIN I (HIGH SENSITIVITY): Troponin I (High Sensitivity): 8 ng/L (ref <18)

## 2023-05-13 LAB — CK: Total CK: 27 U/L — ABNORMAL LOW (ref 49–397)

## 2023-05-13 LAB — FOLATE: Folate: 20.5 ng/mL (ref >5.9)

## 2023-05-13 LAB — I-STAT CG4 LACTIC ACID, ED: Lactic Acid, Venous: 3.7 mmol/L (ref 0.5–1.9)

## 2023-05-13 LAB — VITAMIN B12: Vitamin B-12: 407 pg/mL (ref 180–914)

## 2023-05-13 MED ORDER — PRAVASTATIN SODIUM 10 MG PO TABS
20.0000 mg | ORAL_TABLET | Freq: Every day | ORAL | Status: DC
  Administered 2023-05-13 – 2023-05-15 (×3): 20 mg via ORAL
  Filled 2023-05-13 (×3): qty 2

## 2023-05-13 MED ORDER — GABAPENTIN 300 MG PO CAPS
300.0000 mg | ORAL_CAPSULE | Freq: Every day | ORAL | Status: DC
  Administered 2023-05-13 – 2023-05-15 (×3): 300 mg via ORAL
  Filled 2023-05-13 (×3): qty 1

## 2023-05-13 MED ORDER — DIVALPROEX SODIUM ER 500 MG PO TB24
500.0000 mg | ORAL_TABLET | Freq: Every day | ORAL | Status: DC
  Administered 2023-05-13 – 2023-05-15 (×3): 500 mg via ORAL
  Filled 2023-05-13 (×3): qty 1

## 2023-05-13 MED ORDER — MIRTAZAPINE 15 MG PO TABS
30.0000 mg | ORAL_TABLET | Freq: Every day | ORAL | Status: DC
  Administered 2023-05-13 – 2023-05-15 (×3): 30 mg via ORAL
  Filled 2023-05-13 (×4): qty 2

## 2023-05-13 MED ORDER — ACETAMINOPHEN 650 MG RE SUPP: 650.0000 mg | Freq: Four times a day (QID) | RECTAL | Status: DC | PRN

## 2023-05-13 MED ORDER — SODIUM CHLORIDE 0.9 % IV SOLN
2.0000 g | Freq: Once | INTRAVENOUS | Status: AC
  Administered 2023-05-13: 2 g via INTRAVENOUS
  Filled 2023-05-13: qty 20

## 2023-05-13 MED ORDER — LORAZEPAM 0.5 MG PO TABS: 0.5000 mg | ORAL_TABLET | Freq: Four times a day (QID) | ORAL | Status: DC | PRN

## 2023-05-13 MED ORDER — BUSPIRONE HCL 5 MG PO TABS
10.0000 mg | ORAL_TABLET | Freq: Three times a day (TID) | ORAL | Status: DC
Start: 2023-05-13 — End: 2023-05-16
  Administered 2023-05-13 – 2023-05-16 (×10): 10 mg via ORAL
  Filled 2023-05-13: qty 1
  Filled 2023-05-13 (×2): qty 2
  Filled 2023-05-13: qty 1
  Filled 2023-05-13 (×4): qty 2
  Filled 2023-05-13: qty 1
  Filled 2023-05-13: qty 2

## 2023-05-13 MED ORDER — TRAZODONE HCL 50 MG PO TABS
300.0000 mg | ORAL_TABLET | Freq: Every day | ORAL | Status: DC
  Administered 2023-05-13 – 2023-05-15 (×3): 300 mg via ORAL
  Filled 2023-05-13 (×3): qty 6

## 2023-05-13 MED ORDER — SODIUM CHLORIDE 0.9 % IV SOLN
2.0000 g | INTRAVENOUS | Status: DC
  Administered 2023-05-14 – 2023-05-15 (×2): 2 g via INTRAVENOUS
  Filled 2023-05-13 (×2): qty 20

## 2023-05-13 MED ORDER — ACETAMINOPHEN 325 MG PO TABS: 650.0000 mg | ORAL_TABLET | Freq: Four times a day (QID) | ORAL | Status: DC | PRN

## 2023-05-13 MED ORDER — SODIUM CHLORIDE 0.9 % IV BOLUS
1000.0000 mL | Freq: Once | INTRAVENOUS | Status: AC
  Administered 2023-05-13: 1000 mL via INTRAVENOUS

## 2023-05-13 MED ORDER — PERFLUTREN LIPID MICROSPHERE
1.0000 mL | INTRAVENOUS | Status: AC | PRN
  Administered 2023-05-13: 3 mL via INTRAVENOUS

## 2023-05-13 MED ORDER — VENLAFAXINE HCL ER 75 MG PO CP24
300.0000 mg | ORAL_CAPSULE | Freq: Every day | ORAL | Status: DC
Start: 2023-05-14 — End: 2023-05-16
  Administered 2023-05-14 – 2023-05-16 (×3): 300 mg via ORAL
  Filled 2023-05-13 (×3): qty 4

## 2023-05-13 MED ORDER — PRIMIDONE 50 MG PO TABS: 50.0000 mg | ORAL_TABLET | Freq: Four times a day (QID) | ORAL | Status: DC | PRN

## 2023-05-13 MED ORDER — TAMSULOSIN HCL 0.4 MG PO CAPS
0.4000 mg | ORAL_CAPSULE | Freq: Every day | ORAL | Status: DC
Start: 2023-05-13 — End: 2023-05-16
  Administered 2023-05-13 – 2023-05-16 (×4): 0.4 mg via ORAL
  Filled 2023-05-13 (×4): qty 1

## 2023-05-13 MED ORDER — SODIUM CHLORIDE 0.9 % IV SOLN: INTRAVENOUS | Status: AC

## 2023-05-13 MED ORDER — ARIPIPRAZOLE 2 MG PO TABS
2.0000 mg | ORAL_TABLET | Freq: Every day | ORAL | Status: DC
Start: 2023-05-13 — End: 2023-05-16
  Administered 2023-05-13 – 2023-05-16 (×4): 2 mg via ORAL
  Filled 2023-05-13 (×4): qty 1

## 2023-05-13 NOTE — Subjective & Objective (Signed)
Pt seen and examined. Still in ER awaiting bed assignment. Pt states he feels better. Scr down to 1.67 from 2.04 after IV overnight. Urine cx pending. Remains on IV rocephin. No N/V. No family at bedside.

## 2023-05-13 NOTE — Assessment & Plan Note (Signed)
05-13-2023 echo today shows LVEF has improved to 55%. Restart coreg after AKI has resolved. 05-2023 restart coreg 12.5 mg bid. Calvin Escobar has improved.

## 2023-05-13 NOTE — Progress Notes (Signed)
PROGRESS NOTE    [name]  NFA:[id number] DOB: [birth year] DOA: 05/12/2023 PCP: [name], MD  Subjective: Pt seen and examined. Still in ER awaiting bed assignment. Pt states he feels better. Scr down to 1.67 from 2.04 after IV overnight. Urine cx pending. Remains on IV rocephin. No N/V. No family at bedside.   Hospital Course: HPI: Calvin Escobar is a 72 y.o. male with medical history significant of dementia, chronic HFrEF, bipolar affective disorder, tremors, hypertension, hyperlipidemia, OSA on BiPAP, BPH presented to ED via EMS after an unwitnessed fall at home and was found on the floor by family.  Patient was confused on arrival to the ED and did not remember falling.  Afebrile and not tachycardic.  Hypotensive with systolic as low as 80s.  Not hypoxic.  Labs notable for WBC count 12.5, hemoglobin 8.6 (previously 10.1 on labs 5 days ago and 12.5 on August 2024), MCV 88.3, sodium 133, glucose 130, BUN 26, creatinine 2.0 (baseline 1.1), calcium 8.3, albumin 1.6, normal LFTs, CK 27, i-STAT lactate 2.5> 3.7, blood cultures collected.  UA with negative nitrite, large amount of leukocytes, and microscopy showing 6-10 RBCs, >50 WBCs, and many bacteria.  Urine culture pending.  Chest x-ray showing changes of prior granulomatous disease and no acute abnormality.  CT head negative for acute intracranial abnormality.  CT C-spine negative for acute traumatic injury.  EKG showing sinus rhythm and no acute ischemic changes. Patient was given ceftriaxone and 3 L normal saline.  Blood pressure improved after IV fluid boluses.  Repeat lactate 0.8.  TRH called to admit.     Patient states he had a fall at home but he does not remember when exactly this happened.  He does remember that he was dizzy and walking down some steps but does not remember what happened next.  He is not sure if he passed out or hit his head.  Reporting poor p.o. intake.  States he recently had lab work done by his PCP and was told  that he was anemic.  Denies hematemesis, hematochezia, or melena.  He is having nonbloody watery diarrhea for the past few days.  Denies fevers, cough, shortness of breath, chest pain, nausea, vomiting, or abdominal pain.  Denies dysuria or urinary frequency/urgency.    Significant Events: Admitted 05/12/2023 for syncope, uti, aki   Significant Labs: Admission WBC count 12.5, hemoglobin 8.6 (previously 10.1 on labs 5 days ago and 12.5 on August 2024), MCV 88.3, sodium 133, glucose 130, BUN 26, creatinine 2.0 (baseline 1.1), calcium 8.3, albumin 1.6, normal LFTs, CK 27, i-STAT lactate 2.5> 3.7  Significant Imaging Studies: Admission CT head/c-spine showed No acute intracranial abnormality. Atrophy with small vessel ischemic changes. No traumatic injury to the cervical spine. Mild degenerative changes.  Antibiotic Therapy: Anti-infectives (From admission, onward)    Start     Dose/Rate Route Frequency Ordered Stop   05/14/23 0200  cefTRIAXone (ROCEPHIN) 2 g in sodium chloride 0.9 % 100 mL IVPB        2 g 200 mL/hr over 30 Minutes Intravenous Every 24 hours 05/13/23 0647     05/13/23 0200  cefTRIAXone (ROCEPHIN) 2 g in sodium chloride 0.9 % 100 mL IVPB        2 g 200 mL/hr over 30 Minutes Intravenous  Once 05/13/23 0158 05/13/23 0308       Procedures:   Consultants:     Assessment and Plan: * Syncope 05-13-2023 admitted after syncopal episode. Pt states he has been feeling  poorly for several days to 1-2 week prior to admission. No head trauma. Like due to dehydration. Echo shows improved LVEF now 55%  Hyponatremia 05-13-2023 resolved with IVF. Likely hypovolemic hyponatremia.  AKI (acute kidney injury) (HCC) 05-13-2023 admission Scr of 2.04. after IVF overnight, scr down to 1.67. baseline is 1.0. continue IVF for now.  Diarrhea 05-13-2023 awaiting GI diarrhea panel and C. Diff panel. Doubt C. Diff.  Acute cystitis without hematuria 05-13-2023 continue with IV rocephin. Awaiting  urine cx  HFrEF (heart failure with reduced ejection fraction) (HCC) 05-13-2023 echo today shows LVEF has improved to 55%. Restart coreg after AKI has resolved.  Obesity, Class II, BMI 35-39.9 05-13-2023 BMI 38.90  Normocytic anemia 05-13-2023 iron studies show iron deficiency. Will need to start oral iron at discharge. He will need GI referral at discharge. Iron/TIBC/Ferritin/ %Sat    Component Value Date/Time   IRON 18 (L) 05/13/2023 0647   TIBC 129 (L) 05/13/2023 0647   FERRITIN 1,430 (H) 05/13/2023 0647   IRONPCTSAT 14 (L) 05/13/2023 0647    OSA treated with BiPAP 05-13-2023 stable.  Dementia (HCC) 05-13-2023 stable.   DVT prophylaxis: SCDs Start: 05/13/23 0644    Code Status: Full Code Family Communication: no family at bedside Disposition Plan: return home Reason for continuing need for hospitalization: remains on IVF and IV abx.  Objective: Vitals:   05/13/23 1358 05/13/23 1415 05/13/23 1430 05/13/23 1445  BP: 106/70 108/70 114/74 117/70  Pulse: 73 72 73 78  Resp: 20 18    Temp:      TempSrc:      SpO2: 100% 98% 99% 100%    Intake/Output Summary (Last 24 hours) at 05/13/2023 1641 Last data filed at 05/13/2023 0422 Gross per 24 hour  Intake 3100 ml  Output 450 ml  Net 2650 ml   There were no vitals filed for this visit.  Examination:  Physical Exam Vitals and nursing note reviewed.  Constitutional:      Appearance: He is obese.  HENT:     Head: Normocephalic and atraumatic.     Nose: Nose normal.  Eyes:     General: No scleral icterus. Cardiovascular:     Rate and Rhythm: Normal rate and regular rhythm.  Pulmonary:     Effort: Pulmonary effort is normal.     Breath sounds: Normal breath sounds.  Abdominal:     General: Abdomen is protuberant. Bowel sounds are normal. There is no distension.     Palpations: Abdomen is soft.  Musculoskeletal:     Right lower leg: No edema.     Left lower leg: No edema.  Skin:    General: Skin is warm and dry.      Capillary Refill: Capillary refill takes less than 2 seconds.  Neurological:     General: No focal deficit present.     Mental Status: He is alert. He is disoriented.     Comments: Not oriented to date but knows he is in the hospital     Data Reviewed: I have personally reviewed following labs and imaging studies  CBC: Recent Labs  Lab 05/08/23 1402 05/12/23 2338 05/13/23 0647  WBC 6.5 12.5* 11.9*  NEUTROABS  --  9.2*  --   HGB 10.1* 8.6* 8.5*  HCT 30.9* 27.3* 28.4*  MCV 85.6 88.3 90.4  PLT 249.0 201 194   Basic Metabolic Panel: Recent Labs  Lab 05/12/23 2338 05/13/23 0647  NA 133* 139  K 4.3 4.2  CL 98 99  CO2  24 25  GLUCOSE 130* 108*  BUN 26* 24*  CREATININE 2.04* 1.67*  CALCIUM 8.3* 7.7*   GFR: Estimated Creatinine Clearance: 45 mL/min (A) (by C-G formula based on SCr of 1.67 mg/dL (H)). Liver Function Tests: Recent Labs  Lab 05/12/23 2338  AST 22  ALT 14  ALKPHOS 51  BILITOT 0.4  PROT 6.7  ALBUMIN 1.6*   Cardiac Enzymes: Recent Labs  Lab 05/12/23 2338  CKTOTAL 27*   BNP (last 3 results) Recent Labs    05/13/23 0645  BNP 83.1   CBG: Recent Labs  Lab 05/12/23 2329  GLUCAP 137*   Anemia Panel: Recent Labs    05/13/23 0647 05/13/23 0648  VITAMINB12  --  407  FOLATE  --  20.5  FERRITIN 1,430*  --   TIBC 129*  --   IRON 18*  --   RETICCTPCT  --  2.0   Sepsis Labs: Recent Labs  Lab 05/12/23 2349 05/13/23 0222 05/13/23 0426  LATICACIDVEN 2.5* 3.7* 0.8    Recent Results (from the past 240 hour(s))  Culture, blood (Routine X 2) w Reflex to ID Panel     Status: None (Preliminary result)   Collection Time: 05/13/23 12:10 AM   Specimen: BLOOD  Result Value Ref Range Status   Specimen Description BLOOD LEFT ARM  Final   Special Requests   Final    BOTTLES DRAWN AEROBIC AND ANAEROBIC Blood Culture adequate volume   Culture   Final    NO GROWTH < 12 HOURS Performed at [facility], [address], [place], [place]  [address]    Report Status PENDING  Incomplete  Culture, blood (Routine X 2) w Reflex to ID Panel     Status: None (Preliminary result)   Collection Time: 05/13/23  2:40 AM   Specimen: BLOOD  Result Value Ref Range Status   Specimen Description BLOOD RIGHT ANTECUBITAL  Final   Special Requests   Final    BOTTLES DRAWN AEROBIC AND ANAEROBIC Blood Culture adequate volume   Culture   Final    NO GROWTH < 12 HOURS Performed at [facility], [address], [place], [place] [address]    Report Status PENDING  Incomplete     Radiology Studies: ECHOCARDIOGRAM COMPLETE  Result Date: 05/13/2023    ECHOCARDIOGRAM REPORT   Patient Name:   Calvin Escobar Date of Exam: 05/13/2023 Medical Rec #:  [id number]   Height:       66.0 in Accession #:    [id number]  Weight:       241.0 lb Date of Birth:  [birth year]   BSA:          2.165 m Patient Age:    72 years    BP:           126/73 mmHg Patient Gender: M           HR:           77 bpm. Exam Location:  Inpatient Procedure: 2D Echo, Cardiac Doppler, Color Doppler and Intracardiac            Opacification Agent Indications:    Syncope  History:        Patient has prior history of Echocardiogram examinations, most                 recent 03/15/2023. Risk Factors:Hypertension, Dyslipidemia and                 Sleep Apnea.  Sonographer:    Karma Ganja Referring Phys: [id number] RATHORE  Sonographer Comments: Technically difficult study due to poor echo windows. Image acquisition challenging due to respiratory motion. IMPRESSIONS  1. Abnormal septal motion. Left ventricular ejection fraction, by estimation, is 50 to 55%. The left ventricle has low normal function. The left ventricle has no regional wall motion abnormalities. There is mild left ventricular hypertrophy. Left ventricular diastolic parameters were normal.  2. Right ventricular systolic function is normal. The right ventricular size is normal. There is normal pulmonary artery systolic pressure.  3. The  mitral valve is normal in structure. No evidence of mitral valve regurgitation. No evidence of mitral stenosis.  4. The aortic valve is tricuspid. There is moderate calcification of the aortic valve. There is moderate thickening of the aortic valve. Aortic valve regurgitation is not visualized. Aortic valve sclerosis is present, with no evidence of aortic valve stenosis.  5. The inferior vena cava is normal in size with greater than 50% respiratory variability, suggesting right atrial pressure of 3 mmHg. FINDINGS  Left Ventricle: Abnormal septal motion. Left ventricular ejection fraction, by estimation, is 50 to 55%. The left ventricle has low normal function. The left ventricle has no regional wall motion abnormalities. The left ventricular internal cavity size was normal in size. There is mild left ventricular hypertrophy. Left ventricular diastolic parameters were normal. Right Ventricle: The right ventricular size is normal. No increase in right ventricular wall thickness. Right ventricular systolic function is normal. There is normal pulmonary artery systolic pressure. The tricuspid regurgitant velocity is 2.74 m/s, and  with an assumed right atrial pressure of 3 mmHg, the estimated right ventricular systolic pressure is 33.0 mmHg. Left Atrium: Left atrial size was normal in size. Right Atrium: Right atrial size was normal in size. Pericardium: There is no evidence of pericardial effusion. Mitral Valve: The mitral valve is normal in structure. No evidence of mitral valve regurgitation. No evidence of mitral valve stenosis. Tricuspid Valve: The tricuspid valve is normal in structure. Tricuspid valve regurgitation is not demonstrated. No evidence of tricuspid stenosis. Aortic Valve: The aortic valve is tricuspid. There is moderate calcification of the aortic valve. There is moderate thickening of the aortic valve. Aortic valve regurgitation is not visualized. Aortic valve sclerosis is present, with no evidence of  aortic valve stenosis. Aortic valve mean gradient measures 4.0 mmHg. Aortic valve peak gradient measures 6.4 mmHg. Aortic valve area, by VTI measures 2.71 cm. Pulmonic Valve: The pulmonic valve was normal in structure. Pulmonic valve regurgitation is not visualized. No evidence of pulmonic stenosis. Aorta: The aortic root is normal in size and structure. Venous: The inferior vena cava is normal in size with greater than 50% respiratory variability, suggesting right atrial pressure of 3 mmHg. IAS/Shunts: No atrial level shunt detected by color flow Doppler.  LEFT VENTRICLE PLAX 2D LVIDd:         5.00 cm      Diastology LVIDs:         3.90 cm      LV e' medial:    7.29 cm/s LV PW:         1.20 cm      LV E/e' medial:  9.3 LV IVS:        1.30 cm      LV e' lateral:   15.10 cm/s LVOT diam:     2.10 cm      LV E/e' lateral: 4.5 LV SV:  64 LV SV Index:   29 LVOT Area:     3.46 cm  LV Volumes (MOD) LV vol d, MOD A2C: 113.0 ml LV vol d, MOD A4C: 139.0 ml LV vol s, MOD A2C: 56.9 ml LV vol s, MOD A4C: 67.0 ml LV SV MOD A2C:     56.1 ml LV SV MOD A4C:     139.0 ml LV SV MOD BP:      64.9 ml RIGHT VENTRICLE RV Basal diam:  3.70 cm RV S prime:     19.10 cm/s TAPSE (M-mode): 3.2 cm LEFT ATRIUM             Index        RIGHT ATRIUM           Index LA diam:        3.70 cm 1.71 cm/m   RA Area:     15.00 cm LA Vol (A2C):   74.3 ml 34.32 ml/m  RA Volume:   31.00 ml  14.32 ml/m LA Vol (A4C):   52.9 ml 24.44 ml/m LA Biplane Vol: 67.8 ml 31.32 ml/m  AORTIC VALVE AV Area (Vmax):    2.75 cm AV Area (Vmean):   2.66 cm AV Area (VTI):     2.71 cm AV Vmax:           126.00 cm/s AV Vmean:          86.900 cm/s AV VTI:            0.235 m AV Peak Grad:      6.4 mmHg AV Mean Grad:      4.0 mmHg LVOT Vmax:         100.00 cm/s LVOT Vmean:        66.700 cm/s LVOT VTI:          0.184 m LVOT/AV VTI ratio: 0.78  AORTA Ao Root diam: 3.80 cm Ao Asc diam:  3.90 cm MITRAL VALVE               TRICUSPID VALVE MV Area (PHT): 3.93 cm    TR  Peak grad:   30.0 mmHg MV Decel Time: 193 msec    TR Vmax:        274.00 cm/s MV E velocity: 67.90 cm/s MV A velocity: 83.90 cm/s  SHUNTS MV E/A ratio:  0.81        Systemic VTI:  0.18 m                            Systemic Diam: 2.10 cm Charlton Haws MD Electronically signed by Charlton Haws MD Signature Date/Time: 05/13/2023/10:16:49 AM    Final    CT HEAD WO CONTRAST ( )  Result Date: 05/13/2023 CLINICAL DATA:  Unwitnessed fall, altered mental status EXAM: CT HEAD WITHOUT CONTRAST CT CERVICAL SPINE WITHOUT CONTRAST TECHNIQUE: Multidetector CT imaging of the head and cervical spine was performed following the standard protocol without intravenous contrast. Multiplanar CT image reconstructions of the cervical spine were also generated. RADIATION DOSE REDUCTION: This exam was performed according to the departmental dose-optimization program which includes automated exposure control, adjustment of the mA and/or kV according to patient size and/or use of iterative reconstruction technique. COMPARISON:  CT head dated 01/29/2016 FINDINGS: CT HEAD FINDINGS Brain: No evidence of acute infarction, hemorrhage, hydrocephalus, extra-axial collection or mass lesion/mass effect. Global cortical atrophy. Mild subcortical white matter and periventricular small vessel ischemic changes. Vascular: No hyperdense vessel or  unexpected calcification. Skull: Normal. Negative for fracture or focal lesion. Sinuses/Orbits: The visualized paranasal sinuses are essentially clear. The mastoid air cells are unopacified. Other: None. CT CERVICAL SPINE FINDINGS Alignment: Loss of the normal cervical lordosis. Skull base and vertebrae: No acute fracture. No primary bone lesion or focal pathologic process. Soft tissues and spinal canal: No prevertebral fluid or swelling. No visible canal hematoma. Disc levels: Mild degenerative changes of the mid/lower cervical spine. Spinal canal is patent. Upper chest: Negative. Other: None. IMPRESSION: No  acute intracranial abnormality. Atrophy with small vessel ischemic changes. No traumatic injury to the cervical spine. Mild degenerative changes. Electronically Signed   By: Charline Bills M.D.   On: 05/13/2023 00:24   CT CERVICAL SPINE WO CONTRAST  Result Date: 05/13/2023 CLINICAL DATA:  Unwitnessed fall, altered mental status EXAM: CT HEAD WITHOUT CONTRAST CT CERVICAL SPINE WITHOUT CONTRAST TECHNIQUE: Multidetector CT imaging of the head and cervical spine was performed following the standard protocol without intravenous contrast. Multiplanar CT image reconstructions of the cervical spine were also generated. RADIATION DOSE REDUCTION: This exam was performed according to the departmental dose-optimization program which includes automated exposure control, adjustment of the mA and/or kV according to patient size and/or use of iterative reconstruction technique. COMPARISON:  CT head dated 01/29/2016 FINDINGS: CT HEAD FINDINGS Brain: No evidence of acute infarction, hemorrhage, hydrocephalus, extra-axial collection or mass lesion/mass effect. Global cortical atrophy. Mild subcortical white matter and periventricular small vessel ischemic changes. Vascular: No hyperdense vessel or unexpected calcification. Skull: Normal. Negative for fracture or focal lesion. Sinuses/Orbits: The visualized paranasal sinuses are essentially clear. The mastoid air cells are unopacified. Other: None. CT CERVICAL SPINE FINDINGS Alignment: Loss of the normal cervical lordosis. Skull base and vertebrae: No acute fracture. No primary bone lesion or focal pathologic process. Soft tissues and spinal canal: No prevertebral fluid or swelling. No visible canal hematoma. Disc levels: Mild degenerative changes of the mid/lower cervical spine. Spinal canal is patent. Upper chest: Negative. Other: None. IMPRESSION: No acute intracranial abnormality. Atrophy with small vessel ischemic changes. No traumatic injury to the cervical spine. Mild  degenerative changes. Electronically Signed   By: Charline Bills M.D.   On: 05/13/2023 00:24   DG Chest Port 1 View  Result Date: 05/13/2023 CLINICAL DATA:  Un witnessed fall, initial encounter EXAM: PORTABLE CHEST 1 VIEW COMPARISON:  01/29/16 FINDINGS: Cardiac shadow is mildly prominent but stable. Lungs are hypoinflated. Calcified granuloma is noted in the right mid lung stable from the prior exam. No acute bony abnormality is seen. IMPRESSION: Changes of prior granulomatous disease. No acute abnormality noted. Electronically Signed   By: Alcide Clever M.D.   On: 05/13/2023 00:03    Scheduled Meds: Continuous Infusions:  sodium chloride 50 mL/hr at 05/13/23 1046   [START ON 05/14/2023] cefTRIAXone (ROCEPHIN)  IV       LOS: 0 days   Time spent: 40 minutes  Carollee Herter, DO  [facility]  05/13/2023, 4:41 PM

## 2023-05-13 NOTE — Progress Notes (Signed)
Echocardiogram 2D Echocardiogram has been performed.  Reinaldo Raddle Ronin Rehfeldt 05/13/2023, 9:52 AM

## 2023-05-13 NOTE — ED Notes (Signed)
Attempted to update wife per request from patient, phone went directly to voicemail.

## 2023-05-13 NOTE — Assessment & Plan Note (Signed)
05-13-2023 admission Scr of 2.04. after IVF overnight, scr down to 1.67. baseline is 1.0. continue IVF for now.  05-14-2023 Scr 1.29 today. Pt is eating and drinking. Stop IVF so he does not get volume overloaded.

## 2023-05-13 NOTE — ED Notes (Signed)
Patient continues to try and get out of the bed and requires frequent redirection. Patient moved to a room closer to the nurses station for observation.

## 2023-05-13 NOTE — Assessment & Plan Note (Signed)
05-13-2023 admitted after syncopal episode. Pt states he has been feeling poorly for several days to 1-2 week prior to admission. No head trauma. Like due to dehydration. Echo shows improved LVEF now 55%

## 2023-05-13 NOTE — H&P (Signed)
History and Physical    Calvin Escobar NGE:[id number] DOB: June 28, 1950 DOA: 05/12/2023  PCP: [name], MD  Patient coming from: Home  Chief Complaint: Fall  HPI: Calvin Escobar is a 72 y.o. male with medical history significant of dementia, chronic HFrEF, bipolar affective disorder, tremors, hypertension, hyperlipidemia, OSA on BiPAP, BPH presented to ED via EMS after an unwitnessed fall at home and was found on the floor by family.  Patient was confused on arrival to the ED and did not remember falling.  Afebrile and not tachycardic.  Hypotensive with systolic as low as 80s.  Not hypoxic.  Labs notable for WBC count 12.5, hemoglobin 8.6 (previously 10.1 on labs 5 days ago and 12.5 on August 2024), MCV 88.3, sodium 133, glucose 130, BUN 26, creatinine 2.0 (baseline 1.1), calcium 8.3, albumin 1.6, normal LFTs, CK 27, i-STAT lactate 2.5> 3.7, blood cultures collected.  UA with negative nitrite, large amount of leukocytes, and microscopy showing 6-10 RBCs, >50 WBCs, and many bacteria.  Urine culture pending.  Chest x-ray showing changes of prior granulomatous disease and no acute abnormality.  CT head negative for acute intracranial abnormality.  CT C-spine negative for acute traumatic injury.  EKG showing sinus rhythm and no acute ischemic changes. Patient was given ceftriaxone and 3 L normal saline.  Blood pressure improved after IV fluid boluses.  Repeat lactate 0.8.  [facility] called to admit.    Patient states he had a fall at home but he does not remember when exactly this happened.  He does remember that he was dizzy and walking down some steps but does not remember what happened next.  He is not sure if he passed out or hit his head.  Reporting poor p.o. intake.  States he recently had lab work done by his PCP and was told that he was anemic.  Denies hematemesis, hematochezia, or melena.  He is having nonbloody watery diarrhea for the past few days.  Denies fevers, cough, shortness of breath, chest pain,  nausea, vomiting, or abdominal pain.  Denies dysuria or urinary frequency/urgency.  Review of Systems:  Review of Systems  All other systems reviewed and are negative.   Past Medical History:  Diagnosis Date   Allergy seasonal   Ascending aorta dilatation (HCC)    38 mm by 2D echo 04/2021   Cancer (HCC) skin   DCM (dilated cardiomyopathy) (HCC)    nonischemic with normal coronary arteries at cath 06/2019.  EF 35 to 40% on echo 04/2021   Dementia (HCC)    Depression    Hyperlipidemia    Lithium toxicity 01/29/2016   OSA treated with BiPAP    SKIN CANCER, HX OF 05/10/2007   Facial left check and forehead and r shoulder  F/w derm   Urinary dribbling 03/14/2022    Past Surgical History:  Procedure Laterality Date   COLONOSCOPY     RIGHT/LEFT HEART CATH AND CORONARY ANGIOGRAPHY N/A 07/03/2019   Procedure: RIGHT/LEFT HEART CATH AND CORONARY ANGIOGRAPHY;  Surgeon: Dolores Patty, MD;  Location: [facility];  Service: Cardiovascular;  Laterality: N/A;     reports that he has never smoked. He has never used smokeless tobacco. He reports current alcohol use of about 14.0 standard drinks of alcohol per week. He reports that he does not use drugs.  No Known Allergies  Family History  Problem Relation Age of Onset   Hypertension Mother    Cancer Father    Bone cancer Sister  Prior to Admission medications   Medication Sig Start Date End Date Taking? Authorizing Provider  busPIRone (BUSPAR) 10 MG tablet Take 10 mg by mouth 3 (three) times daily. 12/27/22  Yes [provider]  calcium-vitamin D (OSCAL WITH D) 500-5 MG-MCG tablet Take 1 tablet by mouth daily with breakfast.   Yes [provider]  carvedilol (COREG) 12.5 MG tablet Take 1 tablet (12.5 mg total) by mouth 2 (two) times daily with a meal. 12/19/22  Yes [name], MD  folic acid (FOLVITE) 1 MG tablet Take 1 tablet (1 mg total) by mouth daily. 03/07/23 03/06/24 Yes [name], MD   LORazepam (ATIVAN) 1 MG tablet Take one tablet by mouth in the morning and two tablets at bedtime 01/10/23  Yes [provider]  losartan (COZAAR) 50 MG tablet Take 1 tablet (50 mg total) by mouth daily. Replaces entresto, start after done with entresto 12/19/22  Yes [name], MD  pravastatin (PRAVACHOL) 20 MG tablet Take 1 tablet (20 mg total) by mouth at bedtime. 12/19/22  Yes [name], MD  primidone (MYSOLINE) 50 MG tablet Take 1 tablet (50 mg total) by mouth 4 (four) times daily as needed. For tremor, intention tremor. Patient taking differently: Take 50 mg by mouth 4 (four) times daily as needed (for tremorts, intention tremor). 05/08/23  Yes [name], MD  tamsulosin (FLOMAX) 0.4 MG CAPS capsule TAKE 1 CAPSULE BY MOUTH EVERY DAY 11/12/22  Yes [name], MD  traZODone (DESYREL) 100 MG tablet Take 300 mg by mouth at bedtime. 02/06/20  Yes [provider]  venlafaxine XR (EFFEXOR-XR) 150 MG 24 hr capsule Take 150 mg by mouth daily with breakfast.   Yes [provider]  divalproex (DEPAKOTE ER) 250 MG 24 hr tablet Take 250 mg by mouth at bedtime.    [provider]  ondansetron (ZOFRAN) 4 MG tablet Take 1 tablet (4 mg total) by mouth every 8 (eight) hours as needed for nausea or vomiting. Patient not taking: Reported on 05/13/2023 01/15/23   [name], MD    Physical Exam: Vitals:   05/13/23 0445 05/13/23 0508 05/13/23 0517 05/13/23 0545  BP: 105/64 112/63 107/67 121/66  Pulse: 63 67 63 63  Resp: 20 13 (!) 24 (!) 22  Temp:      TempSrc:      SpO2: 98% 100% 97% 99%    Physical Exam Vitals reviewed.  Constitutional:      General: He is not in acute distress. HENT:     Head: Normocephalic and atraumatic.     Mouth/Throat:     Mouth: Mucous membranes are dry.     Comments: Very dry mucous membranes Eyes:     Extraocular Movements: Extraocular movements intact.  Cardiovascular:     Rate and Rhythm: Normal rate and  regular rhythm.     Pulses: Normal pulses.  Pulmonary:     Effort: Pulmonary effort is normal. No respiratory distress.     Breath sounds: Normal breath sounds. No wheezing or rales.  Abdominal:     General: Bowel sounds are normal. There is no distension.     Palpations: Abdomen is soft.     Tenderness: There is no abdominal tenderness. There is no guarding.  Musculoskeletal:     Cervical back: Normal range of motion.     Right lower leg: No edema.     Left lower leg: No edema.  Skin:    General: Skin is  warm and dry.  Neurological:     General: No focal deficit present.     Mental Status: He is alert and oriented to person, place, and time.     Cranial Nerves: No cranial nerve deficit.     Sensory: No sensory deficit.     Motor: No weakness.     Labs on Admission: I have personally reviewed following labs and imaging studies  CBC: Recent Labs  Lab 05/08/23 1402 05/12/23 2338  WBC 6.5 12.5*  NEUTROABS  --  9.2*  HGB 10.1* 8.6*  HCT 30.9* 27.3*  MCV 85.6 88.3  PLT 249.0 201   Basic Metabolic Panel: Recent Labs  Lab 05/12/23 2338  NA 133*  K 4.3  CL 98  CO2 24  GLUCOSE 130*  BUN 26*  CREATININE 2.04*  CALCIUM 8.3*   GFR: Estimated Creatinine Clearance: 36.8 mL/min (A) (by C-G formula based on SCr of 2.04 mg/dL (H)). Liver Function Tests: Recent Labs  Lab 05/12/23 2338  AST 22  ALT 14  ALKPHOS 51  BILITOT 0.4  PROT 6.7  ALBUMIN 1.6*   No results for input(s): "LIPASE", "AMYLASE" in the last 168 hours. No results for input(s): "AMMONIA" in the last 168 hours. Coagulation Profile: No results for input(s): "INR", "PROTIME" in the last 168 hours. Cardiac Enzymes: Recent Labs  Lab 05/12/23 2338  CKTOTAL 27*   BNP (last 3 results) No results for input(s): "PROBNP" in the last 8760 hours. HbA1C: No results for input(s): "HGBA1C" in the last 72 hours. CBG: Recent Labs  Lab 05/12/23 2329  GLUCAP 137*   Lipid Profile: No results for input(s):  "CHOL", "HDL", "LDLCALC", "TRIG", "CHOLHDL", "LDLDIRECT" in the last 72 hours. Thyroid Function Tests: No results for input(s): "TSH", "T4TOTAL", "FREET4", "T3FREE", "THYROIDAB" in the last 72 hours. Anemia Panel: No results for input(s): "VITAMINB12", "FOLATE", "FERRITIN", "TIBC", "IRON", "RETICCTPCT" in the last 72 hours. Urine analysis:    Component Value Date/Time   COLORURINE AMBER (A) 05/12/2023 2337   APPEARANCEUR CLOUDY (A) 05/12/2023 2337   LABSPEC 1.009 05/12/2023 2337   PHURINE 7.0 05/12/2023 2337   GLUCOSEU NEGATIVE 05/12/2023 2337   HGBUR MODERATE (A) 05/12/2023 2337   BILIRUBINUR NEGATIVE 05/12/2023 2337   KETONESUR NEGATIVE 05/12/2023 2337   PROTEINUR 100 (A) 05/12/2023 2337   UROBILINOGEN 0.2 09/12/2013 1446   NITRITE NEGATIVE 05/12/2023 2337   LEUKOCYTESUR LARGE (A) 05/12/2023 2337    Radiological Exams on Admission: CT HEAD WO CONTRAST ( )  Result Date: 05/13/2023 CLINICAL DATA:  Unwitnessed fall, altered mental status EXAM: CT HEAD WITHOUT CONTRAST CT CERVICAL SPINE WITHOUT CONTRAST TECHNIQUE: Multidetector CT imaging of the head and cervical spine was performed following the standard protocol without intravenous contrast. Multiplanar CT image reconstructions of the cervical spine were also generated. RADIATION DOSE REDUCTION: This exam was performed according to the departmental dose-optimization program which includes automated exposure control, adjustment of the mA and/or kV according to patient size and/or use of iterative reconstruction technique. COMPARISON:  CT head dated 01/29/2016 FINDINGS: CT HEAD FINDINGS Brain: No evidence of acute infarction, hemorrhage, hydrocephalus, extra-axial collection or mass lesion/mass effect. Global cortical atrophy. Mild subcortical white matter and periventricular small vessel ischemic changes. Vascular: No hyperdense vessel or unexpected calcification. Skull: Normal. Negative for fracture or focal lesion. Sinuses/Orbits: The  visualized paranasal sinuses are essentially clear. The mastoid air cells are unopacified. Other: None. CT CERVICAL SPINE FINDINGS Alignment: Loss of the normal cervical lordosis. Skull base and vertebrae: No acute fracture. No primary bone lesion  or focal pathologic process. Soft tissues and spinal canal: No prevertebral fluid or swelling. No visible canal hematoma. Disc levels: Mild degenerative changes of the mid/lower cervical spine. Spinal canal is patent. Upper chest: Negative. Other: None. IMPRESSION: No acute intracranial abnormality. Atrophy with small vessel ischemic changes. No traumatic injury to the cervical spine. Mild degenerative changes. Electronically Signed   By: Charline Bills M.D.   On: 05/13/2023 00:24   CT CERVICAL SPINE WO CONTRAST  Result Date: 05/13/2023 CLINICAL DATA:  Unwitnessed fall, altered mental status EXAM: CT HEAD WITHOUT CONTRAST CT CERVICAL SPINE WITHOUT CONTRAST TECHNIQUE: Multidetector CT imaging of the head and cervical spine was performed following the standard protocol without intravenous contrast. Multiplanar CT image reconstructions of the cervical spine were also generated. RADIATION DOSE REDUCTION: This exam was performed according to the departmental dose-optimization program which includes automated exposure control, adjustment of the mA and/or kV according to patient size and/or use of iterative reconstruction technique. COMPARISON:  CT head dated 01/29/2016 FINDINGS: CT HEAD FINDINGS Brain: No evidence of acute infarction, hemorrhage, hydrocephalus, extra-axial collection or mass lesion/mass effect. Global cortical atrophy. Mild subcortical white matter and periventricular small vessel ischemic changes. Vascular: No hyperdense vessel or unexpected calcification. Skull: Normal. Negative for fracture or focal lesion. Sinuses/Orbits: The visualized paranasal sinuses are essentially clear. The mastoid air cells are unopacified. Other: None. CT CERVICAL SPINE  FINDINGS Alignment: Loss of the normal cervical lordosis. Skull base and vertebrae: No acute fracture. No primary bone lesion or focal pathologic process. Soft tissues and spinal canal: No prevertebral fluid or swelling. No visible canal hematoma. Disc levels: Mild degenerative changes of the mid/lower cervical spine. Spinal canal is patent. Upper chest: Negative. Other: None. IMPRESSION: No acute intracranial abnormality. Atrophy with small vessel ischemic changes. No traumatic injury to the cervical spine. Mild degenerative changes. Electronically Signed   By: Charline Bills M.D.   On: 05/13/2023 00:24   DG Chest Port 1 View  Result Date: 05/13/2023 CLINICAL DATA:  Un witnessed fall, initial encounter EXAM: PORTABLE CHEST 1 VIEW COMPARISON:  01/29/16 FINDINGS: Cardiac shadow is mildly prominent but stable. Lungs are hypoinflated. Calcified granuloma is noted in the right mid lung stable from the prior exam. No acute bony abnormality is seen. IMPRESSION: Changes of prior granulomatous disease. No acute abnormality noted. Electronically Signed   By: Alcide Clever M.D.   On: 05/13/2023 00:03    EKG: Independently reviewed. Sinus rhythm, LAFB. No significant change since previous tracing.   Assessment and Plan  Syncope History concerning for syncopal event.  Likely due to orthostatic hypotension given diarrhea and poor p.o. intake.  Patient reports feeling dizzy prior to the event.  He is not hypoglycemic.  EKG showing sinus rhythm and no acute ischemic changes.  CT head negative for acute intracranial abnormality and no focal neurodeficit on exam.  PE less likely given no tachycardia, dyspnea, chest pain, hypoxia, or clinical signs of DVT.  Continue cardiac monitoring.  Hold antihypertensives and check orthostatics.  Echocardiogram ordered.  Also checking troponin.  UTI UA with evidence of pyuria and bacteriuria.  Hypotension and lactic acidosis likely due to severe dehydration rather than sepsis given  no fever or tachycardia.  Leukocytosis is mild.  Blood pressure improved and lactic acidosis resolved after 3 L IV fluids.  Continue ceftriaxone.  Follow-up urine and blood cultures.  Trend WBC count.  Diarrhea No abdominal pain or tenderness.  C. difficile PCR and GI pathogen panel ordered, enteric precautions.  AKI Likely prerenal  in etiology from severe dehydration given diarrhea and poor p.o. intake. BUN 26, creatinine 2.0 (baseline 1.1).  Continue gentle IV fluid hydration and monitor labs.  Avoid nephrotoxic agents/hold home losartan.  Normocytic anemia Hemoglobin 8.6 (previously 10.1 on labs 5 days ago and 12.5 on August 2024).  Patient denies any symptoms of GI bleed.  Check FOBT.  Anemia panel ordered.  Mild hypovolemic hyponatremia Continue gentle IV fluid hydration and monitor labs.  Chronic HFrEF Last echo done in November 2022 showing EF 35 to 40%, grade 1 diastolic dysfunction.  No signs of volume overload.  Check BNP.  Hypertension Hold antihypertensives at this time and check orthostatics.  OSA on BiPAP Continue nightly BiPAP.  Dementia: Delirium precautions. Bipolar affective disorder Hyperlipidemia BPH Pharmacy med rec pending.  DVT prophylaxis: SCDs until FOBT checked. Code Status: Full Code (discussed with the patient) Family Communication: No family available at this time. Level of care: Progressive Care Unit Admission status: It is my clinical opinion that referral for OBSERVATION is reasonable and necessary in this patient based on the above information provided. The aforementioned taken together are felt to place the patient at high risk for further clinical deterioration. However, it is anticipated that the patient may be medically stable for discharge from the hospital within 24 to 48 hours.  Anden Giovanni MD [facility]  If 7PM-7AM, please contact night-coverage [email or web address]  05/13/2023, 6:11 AM

## 2023-05-13 NOTE — Assessment & Plan Note (Signed)
05-13-2023 continue with IV rocephin. Awaiting urine cx 05-14-2023 E.coli from blood cx. Urine cx growing e. Coli. Awaiting final sensitivities. Continue IV rocephin 2 gram daily.

## 2023-05-13 NOTE — ED Notes (Signed)
Phlebotomist collected 2nd Blood cultures .

## 2023-05-13 NOTE — ED Notes (Signed)
Pt wife called for a update

## 2023-05-13 NOTE — ED Notes (Signed)
ED TO INPATIENT HANDOFF REPORT  ED Nurse Name and Phone #: Lucious Groves [phone]  S Name/Age/Gender Calvin Escobar 72 y.o. male Room/Bed: TRACC/TRACC  Code Status   Code Status: Prior  Home/SNF/Other Home Patient oriented to: self Is this baseline? Yes   Triage Complete: Triage complete  Chief Complaint ams  Triage Note Patient arrived with EMS from home , unwitnessed fall / found on the floor by family , denies injury or pain , CBG=132. Patient unable to recall incident .    Allergies No Known Allergies  Level of Care/Admitting Diagnosis ED Disposition     None       B Medical/Surgery History Past Medical History:  Diagnosis Date   Allergy seasonal   Ascending aorta dilatation (HCC)    38 mm by 2D echo 04/2021   Cancer (HCC) skin   DCM (dilated cardiomyopathy) (HCC)    nonischemic with normal coronary arteries at cath 06/2019.  EF 35 to 40% on echo 04/2021   Dementia (HCC)    Depression    Hyperlipidemia    Lithium toxicity 01/29/2016   OSA treated with BiPAP    SKIN CANCER, HX OF 05/10/2007   Facial left check and forehead and r shoulder  F/w derm   Urinary dribbling 03/14/2022   Past Surgical History:  Procedure Laterality Date   COLONOSCOPY     RIGHT/LEFT HEART CATH AND CORONARY ANGIOGRAPHY N/A 07/03/2019   Procedure: RIGHT/LEFT HEART CATH AND CORONARY ANGIOGRAPHY;  Surgeon: Dolores Patty, MD;  Location: [facility];  Service: Cardiovascular;  Laterality: N/A;     A IV Location/Drains/Wounds Patient Lines/Drains/Airways Status     Active Line/Drains/Airways     Name Placement date Placement time Site Days   Peripheral IV 05/12/23 20 G Left Hand 05/12/23  2336  Hand  1   Peripheral IV 05/13/23 18 G Left Antecubital 05/13/23  0005  Antecubital  less than 1            Intake/Output Last 24 hours  Intake/Output Summary (Last 24 hours) at 05/13/2023 0511 Last data filed at 05/13/2023 0422 Gross per 24 hour  Intake 3100 ml  Output  450 ml  Net 2650 ml    Labs/Imaging Results for orders placed or performed during the hospital encounter of 05/12/23 (from the past 48 hour(s))  CBG monitoring, ED     Status: Abnormal   Collection Time: 05/12/23 11:29 PM  Result Value Ref Range   Glucose-Capillary 137 (H) 70 - 99 mg/dL    Comment: Glucose reference range applies only to samples taken after fasting for at least 8 hours.  Urinalysis, w/ Reflex to Culture (Infection Suspected) -Urine, Clean Catch     Status: Abnormal   Collection Time: 05/12/23 11:37 PM  Result Value Ref Range   Specimen Source URINE, CLEAN CATCH    Color, Urine AMBER (A) YELLOW    Comment: BIOCHEMICALS MAY BE AFFECTED BY COLOR   APPearance CLOUDY (A) CLEAR   Specific Gravity, Urine 1.009 1.005 - 1.030   pH 7.0 5.0 - 8.0   Glucose, UA NEGATIVE NEGATIVE mg/dL   Hgb urine dipstick MODERATE (A) NEGATIVE   Bilirubin Urine NEGATIVE NEGATIVE   Ketones, ur NEGATIVE NEGATIVE mg/dL   Protein, ur 308 (A) NEGATIVE mg/dL   Nitrite NEGATIVE NEGATIVE   Leukocytes,Ua LARGE (A) NEGATIVE   RBC / HPF 6-10 0 - 5 RBC/hpf   WBC, UA >50 0 - 5 WBC/hpf    Comment:  Reflex urine culture not performed if WBC <=10, OR if Squamous epithelial cells >5. If Squamous epithelial cells >5 suggest recollection.    Bacteria, UA MANY (A) NONE SEEN   Squamous Epithelial / HPF 0-5 0 - 5 /HPF   Mucus PRESENT    Non Squamous Epithelial 0-5 (A) NONE SEEN    Comment: Performed at [facility], [address] [place], [place] [address]  CBC with Differential/Platelet     Status: Abnormal   Collection Time: 05/12/23 11:38 PM  Result Value Ref Range   WBC 12.5 (H) 4.0 - 10.5 K/uL   RBC 3.09 (L) 4.22 - 5.81 MIL/uL   Hemoglobin 8.6 (L) 13.0 - 17.0 g/dL   HCT 21.3 (L) 08.6 - 57.8 %   MCV 88.3 80.0 - 100.0 fL   MCH 27.8 26.0 - 34.0 pg   MCHC 31.5 30.0 - 36.0 g/dL   RDW 46.9 62.9 - 52.8 %   Platelets 201 150 - 400 K/uL   nRBC 0.0 0.0 - 0.2 %   Neutrophils Relative % 74  %   Neutro Abs 9.2 (H) 1.7 - 7.7 K/uL   Lymphocytes Relative 13 %   Lymphs Abs 1.6 0.7 - 4.0 K/uL   Monocytes Relative 11 %   Monocytes Absolute 1.4 (H) 0.1 - 1.0 K/uL   Eosinophils Relative 0 %   Eosinophils Absolute 0.0 0.0 - 0.5 K/uL   Basophils Relative 1 %   Basophils Absolute 0.1 0.0 - 0.1 K/uL   Immature Granulocytes 1 %   Abs Immature Granulocytes 0.15 (H) 0.00 - 0.07 K/uL    Comment: Performed at [facility], [address], [place], [place] [address]  Comprehensive metabolic panel     Status: Abnormal   Collection Time: 05/12/23 11:38 PM  Result Value Ref Range   Sodium 133 (L) 135 - 145 mmol/L   Potassium 4.3 3.5 - 5.1 mmol/L   Chloride 98 98 - 111 mmol/L   CO2 24 22 - 32 mmol/L   Glucose, Bld 130 (H) 70 - 99 mg/dL    Comment: Glucose reference range applies only to samples taken after fasting for at least 8 hours.   BUN 26 (H) 8 - 23 mg/dL   Creatinine, Ser 4.01 (H) 0.61 - 1.24 mg/dL   Calcium 8.3 (L) 8.9 - 10.3 mg/dL   Total Protein 6.7 6.5 - 8.1 g/dL   Albumin 1.6 (L) 3.5 - 5.0 g/dL   AST 22 15 - 41 U/L   ALT 14 0 - 44 U/L   Alkaline Phosphatase 51 38 - 126 U/L   Total Bilirubin 0.4 <1.2 mg/dL   GFR, Estimated 34 (L) >60 mL/min    Comment: (NOTE) Calculated using the CKD-EPI Creatinine Equation (2021)    Anion gap 11 5 - 15    Comment: Performed at [facility], [address], [place], [place] [address]  CK     Status: Abnormal   Collection Time: 05/12/23 11:38 PM  Result Value Ref Range   Total CK 27 (L) 49 - 397 U/L    Comment: Performed at [facility], [address], [place], [place] [address]  I-Stat CG4 Lactic Acid     Status: Abnormal   Collection Time: 05/12/23 11:49 PM  Result Value Ref Range   Lactic Acid, Venous 2.5 (HH) 0.5 - 1.9 mmol/L   Comment NOTIFIED PHYSICIAN   I-Stat CG4 Lactic Acid     Status: Abnormal   Collection  Time: 05/13/23  2:22 AM  Result Value Ref Range   Lactic Acid, Venous 3.7 (HH) 0.5 - 1.9 mmol/L    Comment NOTIFIED PHYSICIAN   Lactic acid, plasma     Status: None   Collection Time: 05/13/23  4:26 AM  Result Value Ref Range   Lactic Acid, Venous 0.8 0.5 - 1.9 mmol/L    Comment: Performed at [facility], [address] [place], [place] [address]   CT HEAD WO CONTRAST ( )  Result Date: 05/13/2023 CLINICAL DATA:  Unwitnessed fall, altered mental status EXAM: CT HEAD WITHOUT CONTRAST CT CERVICAL SPINE WITHOUT CONTRAST TECHNIQUE: Multidetector CT imaging of the head and cervical spine was performed following the standard protocol without intravenous contrast. Multiplanar CT image reconstructions of the cervical spine were also generated. RADIATION DOSE REDUCTION: This exam was performed according to the departmental dose-optimization program which includes automated exposure control, adjustment of the mA and/or kV according to patient size and/or use of iterative reconstruction technique. COMPARISON:  CT head dated 01/29/2016 FINDINGS: CT HEAD FINDINGS Brain: No evidence of acute infarction, hemorrhage, hydrocephalus, extra-axial collection or mass lesion/mass effect. Global cortical atrophy. Mild subcortical white matter and periventricular small vessel ischemic changes. Vascular: No hyperdense vessel or unexpected calcification. Skull: Normal. Negative for fracture or focal lesion. Sinuses/Orbits: The visualized paranasal sinuses are essentially clear. The mastoid air cells are unopacified. Other: None. CT CERVICAL SPINE FINDINGS Alignment: Loss of the normal cervical lordosis. Skull base and vertebrae: No acute fracture. No primary bone lesion or focal pathologic process. Soft tissues and spinal canal: No prevertebral fluid or swelling. No visible canal hematoma. Disc levels: Mild degenerative changes of the mid/lower cervical spine. Spinal canal is patent. Upper chest: Negative. Other: None. IMPRESSION: No acute intracranial abnormality. Atrophy with small vessel ischemic changes. No traumatic  injury to the cervical spine. Mild degenerative changes. Electronically Signed   By: Charline Bills M.D.   On: 05/13/2023 00:24   CT CERVICAL SPINE WO CONTRAST  Result Date: 05/13/2023 CLINICAL DATA:  Unwitnessed fall, altered mental status EXAM: CT HEAD WITHOUT CONTRAST CT CERVICAL SPINE WITHOUT CONTRAST TECHNIQUE: Multidetector CT imaging of the head and cervical spine was performed following the standard protocol without intravenous contrast. Multiplanar CT image reconstructions of the cervical spine were also generated. RADIATION DOSE REDUCTION: This exam was performed according to the departmental dose-optimization program which includes automated exposure control, adjustment of the mA and/or kV according to patient size and/or use of iterative reconstruction technique. COMPARISON:  CT head dated 01/29/2016 FINDINGS: CT HEAD FINDINGS Brain: No evidence of acute infarction, hemorrhage, hydrocephalus, extra-axial collection or mass lesion/mass effect. Global cortical atrophy. Mild subcortical white matter and periventricular small vessel ischemic changes. Vascular: No hyperdense vessel or unexpected calcification. Skull: Normal. Negative for fracture or focal lesion. Sinuses/Orbits: The visualized paranasal sinuses are essentially clear. The mastoid air cells are unopacified. Other: None. CT CERVICAL SPINE FINDINGS Alignment: Loss of the normal cervical lordosis. Skull base and vertebrae: No acute fracture. No primary bone lesion or focal pathologic process. Soft tissues and spinal canal: No prevertebral fluid or swelling. No visible canal hematoma. Disc levels: Mild degenerative changes of the mid/lower cervical spine. Spinal canal is patent. Upper chest: Negative. Other: None. IMPRESSION: No acute intracranial abnormality. Atrophy with small vessel ischemic changes. No traumatic injury to the cervical spine. Mild degenerative changes. Electronically Signed   By: Charline Bills M.D.   On: 05/13/2023  00:24   DG Chest Port 1 View  Result Date:  05/13/2023 CLINICAL DATA:  Un witnessed fall, initial encounter EXAM: PORTABLE CHEST 1 VIEW COMPARISON:  01/29/16 FINDINGS: Cardiac shadow is mildly prominent but stable. Lungs are hypoinflated. Calcified granuloma is noted in the right mid lung stable from the prior exam. No acute bony abnormality is seen. IMPRESSION: Changes of prior granulomatous disease. No acute abnormality noted. Electronically Signed   By: Alcide Clever M.D.   On: 05/13/2023 00:03    Pending Labs Unresulted Labs (From admission, onward)     Start     Ordered   05/13/23 0158  Culture, blood (Routine X 2) w Reflex to ID Panel  BLOOD CULTURE X 2,   R (with STAT occurrences)      05/13/23 0158   05/12/23 2337  Urine Culture  Once,   R        05/12/23 2337            Vitals/Pain Today's Vitals   05/13/23 0441 05/13/23 0445 05/13/23 0508 05/13/23 0509  BP: 113/76 105/64 112/63   Pulse: 63 63 67   Resp: (!) 22 20 13    Temp:      TempSrc:      SpO2: 100% 98% 100%   PainSc:    0-No pain    Isolation Precautions No active isolations  Medications Medications  sodium chloride 0.9 % bolus 1,000 mL (0 mLs Intravenous Stopped 05/13/23 0100)  cefTRIAXone (ROCEPHIN) 2 g in sodium chloride 0.9 % 100 mL IVPB (0 g Intravenous Stopped 05/13/23 0308)  sodium chloride 0.9 % bolus 1,000 mL (0 mLs Intravenous Stopped 05/13/23 0307)  sodium chloride 0.9 % bolus 1,000 mL (0 mLs Intravenous Stopped 05/13/23 0422)    Mobility walks     Focused Assessments     R Recommendations: See Admitting Provider Note  Report given to:   Additional Notes:

## 2023-05-13 NOTE — Progress Notes (Signed)
E. coli associated bacteremia: Acute cystitis Informed by pharmacy that 2 of 4 blood cultures E.coli (no resistance). Patient already on appropriate therapy with IV ceftriaxone 2 g every 24 hours.   Tereasa Coop, MD [facility] 05/13/2023, 10:09 PM

## 2023-05-13 NOTE — ED Notes (Signed)
EDP notified on patient's hypotension .

## 2023-05-13 NOTE — Assessment & Plan Note (Signed)
05-13-2023 resolved with IVF. Likely hypovolemic hyponatremia.  05-14-2023 resolved after IVF. Hyponatremia due to hypovolemia.

## 2023-05-13 NOTE — Progress Notes (Signed)
   05/13/23 2251  BiPAP/CPAP/SIPAP  Reason BIPAP/CPAP not in use Non-compliant (Pt states that he does not wear one at home and does not want to wear one here.)  BiPAP/CPAP /SiPAP Vitals  Pulse Rate 95  Resp 13  SpO2 97 %  MEWS Score/Color  MEWS Score 1  MEWS Score Color Green   Pt states that he does not wear one at home and does not want to wear one here.

## 2023-05-13 NOTE — Assessment & Plan Note (Signed)
05-13-2023 awaiting GI diarrhea panel and C. Diff panel. Doubt C. Diff. 05-14-2023 awaiting c. Diff and GI diarrhea panel.

## 2023-05-13 NOTE — Progress Notes (Signed)
PHARMACY - PHYSICIAN COMMUNICATION CRITICAL VALUE ALERT - BLOOD CULTURE IDENTIFICATION (BCID)  Calvin Escobar is an 72 y.o. male who presented to [facility] on 05/12/2023 with a chief complaint of syncope.  Assessment:  2 of 4 bottles growing E.coli (no resistance), possible urinary source  Name of physician (or Provider) Contacted: Dr. Janalyn Shy  Current antibiotics: ceftriaxone 2g IV q24h  Changes to prescribed antibiotics recommended:  Patient is on recommended antibiotics - No changes needed  Results for orders placed or performed during the hospital encounter of 05/12/23  Blood Culture ID Panel (Reflexed) (Collected: 05/13/2023  2:40 AM)  Result Value Ref Range   Enterococcus faecalis NOT DETECTED NOT DETECTED   Enterococcus Faecium NOT DETECTED NOT DETECTED   Listeria monocytogenes NOT DETECTED NOT DETECTED   Staphylococcus species NOT DETECTED NOT DETECTED   Staphylococcus aureus (BCID) NOT DETECTED NOT DETECTED   Staphylococcus epidermidis NOT DETECTED NOT DETECTED   Staphylococcus lugdunensis NOT DETECTED NOT DETECTED   Streptococcus species NOT DETECTED NOT DETECTED   Streptococcus agalactiae NOT DETECTED NOT DETECTED   Streptococcus pneumoniae NOT DETECTED NOT DETECTED   Streptococcus pyogenes NOT DETECTED NOT DETECTED   A.calcoaceticus-baumannii NOT DETECTED NOT DETECTED   Bacteroides fragilis NOT DETECTED NOT DETECTED   Enterobacterales DETECTED (A) NOT DETECTED   Enterobacter cloacae complex NOT DETECTED NOT DETECTED   Escherichia coli DETECTED (A) NOT DETECTED   Klebsiella aerogenes NOT DETECTED NOT DETECTED   Klebsiella oxytoca NOT DETECTED NOT DETECTED   Klebsiella pneumoniae NOT DETECTED NOT DETECTED   Proteus species NOT DETECTED NOT DETECTED   Salmonella species NOT DETECTED NOT DETECTED   Serratia marcescens NOT DETECTED NOT DETECTED   Haemophilus influenzae NOT DETECTED NOT DETECTED   Neisseria meningitidis NOT DETECTED NOT DETECTED   Pseudomonas aeruginosa NOT  DETECTED NOT DETECTED   Stenotrophomonas maltophilia NOT DETECTED NOT DETECTED   Candida albicans NOT DETECTED NOT DETECTED   Candida auris NOT DETECTED NOT DETECTED   Candida glabrata NOT DETECTED NOT DETECTED   Candida krusei NOT DETECTED NOT DETECTED   Candida parapsilosis NOT DETECTED NOT DETECTED   Candida tropicalis NOT DETECTED NOT DETECTED   Cryptococcus neoformans/gattii NOT DETECTED NOT DETECTED   CTX-M ESBL NOT DETECTED NOT DETECTED   Carbapenem resistance IMP NOT DETECTED NOT DETECTED   Carbapenem resistance KPC NOT DETECTED NOT DETECTED   Carbapenem resistance NDM NOT DETECTED NOT DETECTED   Carbapenem resist OXA 48 LIKE NOT DETECTED NOT DETECTED   Carbapenem resistance VIM NOT DETECTED NOT DETECTED    Loralee Pacas, PharmD, BCPS 05/13/2023  10:06 PM

## 2023-05-13 NOTE — Assessment & Plan Note (Signed)
>>  ASSESSMENT AND PLAN FOR OBESITY, CLASS II, BMI 35-39.9 WRITTEN ON 05/13/2023  4:40 PM BY CHEN, ERIC, DO  05-13-2023 BMI 38.90

## 2023-05-13 NOTE — Assessment & Plan Note (Signed)
05-13-2023 iron studies show iron deficiency. Will need to start oral iron at discharge. He will need GI referral at discharge. Iron/TIBC/Ferritin/ %Sat    Component Value Date/Time   IRON 18 (L) 05/13/2023 0647   TIBC 129 (L) 05/13/2023 0647   FERRITIN 1,430 (H) 05/13/2023 0647   IRONPCTSAT 14 (L) 05/13/2023 1610

## 2023-05-13 NOTE — Assessment & Plan Note (Signed)
05-13-2023 stable.

## 2023-05-13 NOTE — Assessment & Plan Note (Signed)
05-13-2023 BMI 38.90

## 2023-05-13 NOTE — Assessment & Plan Note (Signed)
>>  ASSESSMENT AND PLAN FOR NORMOCYTIC ANEMIA WRITTEN ON 05/13/2023  4:39 PM BY CHEN, ERIC, DO  05-13-2023 iron studies show iron deficiency. Will need to start oral iron at discharge. He will need GI referral at discharge. Iron/TIBC/Ferritin/ %Sat    Component Value Date/Time   IRON 18 (L) 05/13/2023 0647   TIBC 129 (L) 05/13/2023 0647   FERRITIN 1,430 (H) 05/13/2023 0647   IRONPCTSAT 14 (L) 05/13/2023 9352

## 2023-05-13 NOTE — Hospital Course (Signed)
HPI: Calvin Escobar is a 72 y.o. male with medical history significant of dementia, chronic HFrEF, bipolar affective disorder, tremors, hypertension, hyperlipidemia, OSA on BiPAP, BPH presented to ED via EMS after an unwitnessed fall at home and was found on the floor by family.  Patient was confused on arrival to the ED and did not remember falling.  Afebrile and not tachycardic.  Hypotensive with systolic as low as 80s.  Not hypoxic.  Labs notable for WBC count 12.5, hemoglobin 8.6 (previously 10.1 on labs 5 days ago and 12.5 on August 2024), MCV 88.3, sodium 133, glucose 130, BUN 26, creatinine 2.0 (baseline 1.1), calcium 8.3, albumin 1.6, normal LFTs, CK 27, i-STAT lactate 2.5> 3.7, blood cultures collected.  UA with negative nitrite, large amount of leukocytes, and microscopy showing 6-10 RBCs, >50 WBCs, and many bacteria.  Urine culture pending.  Chest x-ray showing changes of prior granulomatous disease and no acute abnormality.  CT head negative for acute intracranial abnormality.  CT C-spine negative for acute traumatic injury.  EKG showing sinus rhythm and no acute ischemic changes. Patient was given ceftriaxone and 3 L normal saline.  Blood pressure improved after IV fluid boluses.  Repeat lactate 0.8.  TRH called to admit.     Patient states he had a fall at home but he does not remember when exactly this happened.  He does remember that he was dizzy and walking down some steps but does not remember what happened next.  He is not sure if he passed out or hit his head.  Reporting poor p.o. intake.  States he recently had lab work done by his PCP and was told that he was anemic.  Denies hematemesis, hematochezia, or melena.  He is having nonbloody watery diarrhea for the past few days.  Denies fevers, cough, shortness of breath, chest pain, nausea, vomiting, or abdominal pain.  Denies dysuria or urinary frequency/urgency.    Significant Events: Admitted 05/12/2023 for syncope, uti, aki   Significant  Labs: Admission WBC count 12.5, hemoglobin 8.6 (previously 10.1 on labs 5 days ago and 12.5 on August 2024), MCV 88.3, sodium 133, glucose 130, BUN 26, creatinine 2.0 (baseline 1.1), calcium 8.3, albumin 1.6, normal LFTs, CK 27, i-STAT lactate 2.5> 3.7  Significant Imaging Studies: Admission CT head/c-spine showed No acute intracranial abnormality. Atrophy with small vessel ischemic changes. No traumatic injury to the cervical spine. Mild degenerative changes.  Antibiotic Therapy: Anti-infectives (From admission, onward)    Start     Dose/Rate Route Frequency Ordered Stop   05/14/23 0200  cefTRIAXone (ROCEPHIN) 2 g in sodium chloride 0.9 % 100 mL IVPB        2 g 200 mL/hr over 30 Minutes Intravenous Every 24 hours 05/13/23 0647     05/13/23 0200  cefTRIAXone (ROCEPHIN) 2 g in sodium chloride 0.9 % 100 mL IVPB        2 g 200 mL/hr over 30 Minutes Intravenous  Once 05/13/23 0158 05/13/23 0308       Procedures:   Consultants:

## 2023-05-13 NOTE — ED Provider Notes (Signed)
[place] EMERGENCY DEPARTMENT AT [facility] Provider Note   CSN: [id number] Arrival date & time: 05/12/23  2325     History  Chief Complaint  Patient presents with   Marletta Lor     Calvin Escobar is a 72 y.o. male.  Presents to the emerged department from home after a fall.  Patient had an unwitnessed fall, unknown amount of time on the ground.  Patient sent to the ED after family found him.  He seems confused, does not remember falling.  He denies any pain at arrival.       Home Medications Prior to Admission medications   Medication Sig Start Date End Date Taking? Authorizing Provider  busPIRone (BUSPAR) 10 MG tablet Take 10 mg by mouth 3 (three) times daily. 12/27/22   [provider]  calcium-vitamin D (OSCAL WITH D) 500-5 MG-MCG tablet Take 1 tablet by mouth daily with breakfast.    [provider]  carvedilol (COREG) 12.5 MG tablet Take 1 tablet (12.5 mg total) by mouth 2 (two) times daily with a meal. 12/19/22   [name], MD  divalproex (DEPAKOTE ER) 250 MG 24 hr tablet Take 250 mg by mouth at bedtime.    [provider]  folic acid (FOLVITE) 1 MG tablet Take 1 tablet (1 mg total) by mouth daily. 03/07/23 03/06/24  [name], MD  LORazepam (ATIVAN) 1 MG tablet Take one tablet by mouth in the morning and two tablets at bedtime 01/10/23   [provider]  losartan (COZAAR) 50 MG tablet Take 1 tablet (50 mg total) by mouth daily. Replaces entresto, start after done with entresto 12/19/22   [name], MD  ondansetron [facility]) 4 MG tablet Take 1 tablet (4 mg total) by mouth every 8 (eight) hours as needed for nausea or vomiting. 01/15/23   [name], MD  pravastatin (PRAVACHOL) 20 MG tablet Take 1 tablet (20 mg total) by mouth at bedtime. 12/19/22   [name], MD  primidone (MYSOLINE) 50 MG tablet Take 1 tablet (50 mg total) by mouth 4 (four) times daily as needed. For tremor, intention tremor. 05/08/23   [name], MD  tamsulosin (FLOMAX) 0.4 MG CAPS capsule TAKE 1 CAPSULE BY MOUTH EVERY DAY 11/12/22   [name], MD  traZODone (DESYREL) 100 MG tablet Take 300 mg by mouth at bedtime. 02/06/20   [provider]  venlafaxine XR (EFFEXOR-XR) 150 MG 24 hr capsule Take 150 mg by mouth daily with breakfast.    [provider]      Allergies    Patient has no known allergies.    Review of Systems   Review of Systems  Physical Exam Updated Vital Signs BP 112/63   Pulse 67   Temp 97.6 F (36.4 C) (Temporal)   Resp 13   SpO2 100%  Physical Exam Vitals and nursing note reviewed.  Constitutional:      General: He is not in acute distress.    Appearance: He is well-developed.  HENT:     Head: Normocephalic and atraumatic.     Mouth/Throat:     Mouth: Mucous membranes are moist.  Eyes:     General: Vision grossly intact. Gaze aligned appropriately.     Extraocular Movements: Extraocular movements intact.     Conjunctiva/sclera: Conjunctivae normal.  Cardiovascular:     Rate and Rhythm: Normal rate and regular rhythm.     Pulses: Normal pulses.     Heart  sounds: Normal heart sounds, S1 normal and S2 normal. No murmur heard.    No friction rub. No gallop.  Pulmonary:     Effort: Pulmonary effort is normal. No respiratory distress.     Breath sounds: Normal breath sounds.  Abdominal:     Palpations: Abdomen is soft.     Tenderness: There is no abdominal tenderness. There is no guarding or rebound.     Hernia: No hernia is present.  Musculoskeletal:        General: No swelling.     Cervical back: Full passive range of motion without pain, normal range of motion and neck supple. No pain with movement, spinous process tenderness or muscular tenderness. Normal range of motion.     Right lower leg: No edema.     Left lower leg: No edema.  Skin:    General: Skin is warm and dry.     Capillary Refill: Capillary refill takes less than 2 seconds.     Findings: No  ecchymosis, erythema, lesion or wound.  Neurological:     Mental Status: He is alert. He is confused.     Cranial Nerves: Cranial nerves 2-12 are intact.     Sensory: Sensation is intact.     Motor: Motor function is intact. No weakness or abnormal muscle tone.     Coordination: Coordination is intact.  Psychiatric:        Mood and Affect: Mood normal.        Speech: Speech normal.        Behavior: Behavior normal.     ED Results / Procedures / Treatments   Labs (all labs ordered are listed, but only abnormal results are displayed) Labs Reviewed  CBC WITH DIFFERENTIAL/PLATELET - Abnormal; Notable for the following components:      Result Value   WBC 12.5 (*)    RBC 3.09 (*)    Hemoglobin 8.6 (*)    HCT 27.3 (*)    Neutro Abs 9.2 (*)    Monocytes Absolute 1.4 (*)    Abs Immature Granulocytes 0.15 (*)    All other components within normal limits  COMPREHENSIVE METABOLIC PANEL - Abnormal; Notable for the following components:   Sodium 133 (*)    Glucose, Bld 130 (*)    BUN 26 (*)    Creatinine, Ser 2.04 (*)    Calcium 8.3 (*)    Albumin 1.6 (*)    GFR, Estimated 34 (*)    All other components within normal limits  CK - Abnormal; Notable for the following components:   Total CK 27 (*)    All other components within normal limits  URINALYSIS, W/ REFLEX TO CULTURE (INFECTION SUSPECTED) - Abnormal; Notable for the following components:   Color, Urine AMBER (*)    APPearance CLOUDY (*)    Hgb urine dipstick MODERATE (*)    Protein, ur 100 (*)    Leukocytes,Ua LARGE (*)    Bacteria, UA MANY (*)    Non Squamous Epithelial 0-5 (*)    All other components within normal limits  CBG MONITORING, ED - Abnormal; Notable for the following components:   Glucose-Capillary 137 (*)    All other components within normal limits  I-STAT CG4 LACTIC ACID, ED - Abnormal; Notable for the following components:   Lactic Acid, Venous 2.5 (*)    All other components within normal limits  I-STAT  CG4 LACTIC ACID, ED - Abnormal; Notable for the following components:   Lactic Acid, Venous 3.7 (*)  All other components within normal limits  URINE CULTURE  CULTURE, BLOOD (ROUTINE X 2)  CULTURE, BLOOD (ROUTINE X 2)  LACTIC ACID, PLASMA    EKG EKG Interpretation Date/Time:  Sunday May 12 2023 23:35:54 EST Ventricular Rate:  82 PR Interval:  163 QRS Duration:  129 QT Interval:  394 QTC Calculation: 461 R Axis:   -37  Text Interpretation: Sinus rhythm Nonspecific IVCD with LAD No acute changes Confirmed by Gilda Crease [phone]) on 05/13/2023 12:40:49 AM  Radiology CT HEAD WO CONTRAST ( )  Result Date: 05/13/2023 CLINICAL DATA:  Unwitnessed fall, altered mental status EXAM: CT HEAD WITHOUT CONTRAST CT CERVICAL SPINE WITHOUT CONTRAST TECHNIQUE: Multidetector CT imaging of the head and cervical spine was performed following the standard protocol without intravenous contrast. Multiplanar CT image reconstructions of the cervical spine were also generated. RADIATION DOSE REDUCTION: This exam was performed according to the departmental dose-optimization program which includes automated exposure control, adjustment of the mA and/or kV according to patient size and/or use of iterative reconstruction technique. COMPARISON:  CT head dated 01/29/2016 FINDINGS: CT HEAD FINDINGS Brain: No evidence of acute infarction, hemorrhage, hydrocephalus, extra-axial collection or mass lesion/mass effect. Global cortical atrophy. Mild subcortical white matter and periventricular small vessel ischemic changes. Vascular: No hyperdense vessel or unexpected calcification. Skull: Normal. Negative for fracture or focal lesion. Sinuses/Orbits: The visualized paranasal sinuses are essentially clear. The mastoid air cells are unopacified. Other: None. CT CERVICAL SPINE FINDINGS Alignment: Loss of the normal cervical lordosis. Skull base and vertebrae: No acute fracture. No primary bone lesion or focal  pathologic process. Soft tissues and spinal canal: No prevertebral fluid or swelling. No visible canal hematoma. Disc levels: Mild degenerative changes of the mid/lower cervical spine. Spinal canal is patent. Upper chest: Negative. Other: None. IMPRESSION: No acute intracranial abnormality. Atrophy with small vessel ischemic changes. No traumatic injury to the cervical spine. Mild degenerative changes. Electronically Signed   By: Charline Bills M.D.   On: 05/13/2023 00:24   CT CERVICAL SPINE WO CONTRAST  Result Date: 05/13/2023 CLINICAL DATA:  Unwitnessed fall, altered mental status EXAM: CT HEAD WITHOUT CONTRAST CT CERVICAL SPINE WITHOUT CONTRAST TECHNIQUE: Multidetector CT imaging of the head and cervical spine was performed following the standard protocol without intravenous contrast. Multiplanar CT image reconstructions of the cervical spine were also generated. RADIATION DOSE REDUCTION: This exam was performed according to the departmental dose-optimization program which includes automated exposure control, adjustment of the mA and/or kV according to patient size and/or use of iterative reconstruction technique. COMPARISON:  CT head dated 01/29/2016 FINDINGS: CT HEAD FINDINGS Brain: No evidence of acute infarction, hemorrhage, hydrocephalus, extra-axial collection or mass lesion/mass effect. Global cortical atrophy. Mild subcortical white matter and periventricular small vessel ischemic changes. Vascular: No hyperdense vessel or unexpected calcification. Skull: Normal. Negative for fracture or focal lesion. Sinuses/Orbits: The visualized paranasal sinuses are essentially clear. The mastoid air cells are unopacified. Other: None. CT CERVICAL SPINE FINDINGS Alignment: Loss of the normal cervical lordosis. Skull base and vertebrae: No acute fracture. No primary bone lesion or focal pathologic process. Soft tissues and spinal canal: No prevertebral fluid or swelling. No visible canal hematoma. Disc levels:  Mild degenerative changes of the mid/lower cervical spine. Spinal canal is patent. Upper chest: Negative. Other: None. IMPRESSION: No acute intracranial abnormality. Atrophy with small vessel ischemic changes. No traumatic injury to the cervical spine. Mild degenerative changes. Electronically Signed   By: Charline Bills M.D.   On: 05/13/2023 00:24   DG  Chest Port 1 View  Result Date: 05/13/2023 CLINICAL DATA:  Un witnessed fall, initial encounter EXAM: PORTABLE CHEST 1 VIEW COMPARISON:  01/29/16 FINDINGS: Cardiac shadow is mildly prominent but stable. Lungs are hypoinflated. Calcified granuloma is noted in the right mid lung stable from the prior exam. No acute bony abnormality is seen. IMPRESSION: Changes of prior granulomatous disease. No acute abnormality noted. Electronically Signed   By: Alcide Clever M.D.   On: 05/13/2023 00:03    Procedures Procedures    Medications Ordered in ED Medications  sodium chloride 0.9 % bolus 1,000 mL (0 mLs Intravenous Stopped 05/13/23 0100)  cefTRIAXone (ROCEPHIN) 2 g in sodium chloride 0.9 % 100 mL IVPB (0 g Intravenous Stopped 05/13/23 0308)  sodium chloride 0.9 % bolus 1,000 mL (0 mLs Intravenous Stopped 05/13/23 0307)  sodium chloride 0.9 % bolus 1,000 mL (0 mLs Intravenous Stopped 05/13/23 0422)    ED Course/ Medical Decision Making/ A&P                                 Medical Decision Making Amount and/or Complexity of Data Reviewed Independent Historian: EMS External Data Reviewed: labs, radiology, ECG and notes. Labs: ordered. Decision-making details documented in ED Course. Radiology: ordered and independent interpretation performed. Decision-making details documented in ED Course. ECG/medicine tests: ordered and independent interpretation performed. Decision-making details documented in ED Course.  Risk Prescription drug management. Decision regarding hospitalization.   Differential diagnosis considered includes, but not limited  to: TIA; Stroke; ICH; Seizure; electrolyte abnormality; hypoglycemia; toxic/pharmacologic causes; sepsis; CNS infection; psychiatric disorder  Patient brought to the emergency department for evaluation after a fall.  Patient denies any pain or injury.  He is moving both lower extremities without difficulty, no concern for hip injury.  Patient confused.  He does not remember falling.  CT head and cervical spine performed, no acute injury noted.  Patient was hypotensive for EMS.  He was given IV fluids with some improvement.  He is still somewhat hypotensive at arrival.  Additional IV fluids administered.  Patient's lab work indicate acute kidney injury, likely moderate to severe dehydration.  No fever at arrival.  No tachycardia, tachypnea or difficulty breathing.  Patient has a slight leukocytosis, white blood cell count is 12.5.  Lactic acid elevated.  This might be multifactorial.  Second lactic acid was further elevated.  Patient administered additional IV fluids and a formal lactic acid was sent to the lab rather than an i-STAT.  This is normal.  Pressures are improved with IV fluids.  Ultimately urinalysis was obtained and does suggest infection.  Cultures of blood and urine pending.  Administered Rocephin.  Will require hospitalization for further management.  CRITICAL CARE Performed by: Gilda Crease   Total critical care time: 35 minutes  Critical care time was exclusive of separately billable procedures and treating other patients.  Critical care was necessary to treat or prevent imminent or life-threatening deterioration.  Critical care was time spent personally by me on the following activities: development of treatment plan with patient and/or surrogate as well as nursing, discussions with consultants, evaluation of patient's response to treatment, examination of patient, obtaining history from patient or surrogate, ordering and performing treatments and interventions,  ordering and review of laboratory studies, ordering and review of radiographic studies, pulse oximetry and re-evaluation of patient's condition.         Final Clinical Impression(s) / ED Diagnoses Final diagnoses:  AKI (acute kidney injury) (HCC)  Urinary tract infection without hematuria, site unspecified    Rx / DC Orders ED Discharge Orders     None         Angello Chien, Canary Brim, MD 05/13/23 [phone]

## 2023-05-14 DIAGNOSIS — Z6838 Body mass index (BMI) 38.0-38.9, adult: Secondary | ICD-10-CM | POA: Diagnosis not present

## 2023-05-14 DIAGNOSIS — G9341 Metabolic encephalopathy: Secondary | ICD-10-CM | POA: Diagnosis present

## 2023-05-14 DIAGNOSIS — D649 Anemia, unspecified: Secondary | ICD-10-CM | POA: Diagnosis not present

## 2023-05-14 DIAGNOSIS — G252 Other specified forms of tremor: Secondary | ICD-10-CM | POA: Diagnosis present

## 2023-05-14 DIAGNOSIS — R109 Unspecified abdominal pain: Secondary | ICD-10-CM | POA: Diagnosis not present

## 2023-05-14 DIAGNOSIS — F0393 Unspecified dementia, unspecified severity, with mood disturbance: Secondary | ICD-10-CM | POA: Diagnosis present

## 2023-05-14 DIAGNOSIS — R55 Syncope and collapse: Secondary | ICD-10-CM | POA: Diagnosis present

## 2023-05-14 DIAGNOSIS — E872 Acidosis, unspecified: Secondary | ICD-10-CM | POA: Diagnosis present

## 2023-05-14 DIAGNOSIS — E66812 Obesity, class 2: Secondary | ICD-10-CM | POA: Diagnosis present

## 2023-05-14 DIAGNOSIS — I5020 Unspecified systolic (congestive) heart failure: Secondary | ICD-10-CM | POA: Diagnosis not present

## 2023-05-14 DIAGNOSIS — B9620 Unspecified Escherichia coli [E. coli] as the cause of diseases classified elsewhere: Secondary | ICD-10-CM

## 2023-05-14 DIAGNOSIS — W19XXXA Unspecified fall, initial encounter: Secondary | ICD-10-CM | POA: Diagnosis present

## 2023-05-14 DIAGNOSIS — R7881 Bacteremia: Secondary | ICD-10-CM

## 2023-05-14 DIAGNOSIS — Y92009 Unspecified place in unspecified non-institutional (private) residence as the place of occurrence of the external cause: Secondary | ICD-10-CM | POA: Diagnosis not present

## 2023-05-14 DIAGNOSIS — I110 Hypertensive heart disease with heart failure: Secondary | ICD-10-CM | POA: Diagnosis present

## 2023-05-14 DIAGNOSIS — N179 Acute kidney failure, unspecified: Secondary | ICD-10-CM | POA: Diagnosis present

## 2023-05-14 DIAGNOSIS — G4733 Obstructive sleep apnea (adult) (pediatric): Secondary | ICD-10-CM | POA: Diagnosis present

## 2023-05-14 DIAGNOSIS — E785 Hyperlipidemia, unspecified: Secondary | ICD-10-CM | POA: Diagnosis present

## 2023-05-14 DIAGNOSIS — I951 Orthostatic hypotension: Secondary | ICD-10-CM | POA: Diagnosis present

## 2023-05-14 DIAGNOSIS — R6520 Severe sepsis without septic shock: Secondary | ICD-10-CM | POA: Diagnosis present

## 2023-05-14 DIAGNOSIS — E861 Hypovolemia: Secondary | ICD-10-CM | POA: Diagnosis present

## 2023-05-14 DIAGNOSIS — N3000 Acute cystitis without hematuria: Secondary | ICD-10-CM | POA: Diagnosis present

## 2023-05-14 DIAGNOSIS — E860 Dehydration: Secondary | ICD-10-CM | POA: Diagnosis present

## 2023-05-14 DIAGNOSIS — I5022 Chronic systolic (congestive) heart failure: Secondary | ICD-10-CM | POA: Diagnosis present

## 2023-05-14 DIAGNOSIS — Z79899 Other long term (current) drug therapy: Secondary | ICD-10-CM | POA: Diagnosis not present

## 2023-05-14 DIAGNOSIS — F319 Bipolar disorder, unspecified: Secondary | ICD-10-CM | POA: Diagnosis present

## 2023-05-14 DIAGNOSIS — N400 Enlarged prostate without lower urinary tract symptoms: Secondary | ICD-10-CM | POA: Diagnosis present

## 2023-05-14 DIAGNOSIS — A4151 Sepsis due to Escherichia coli [E. coli]: Secondary | ICD-10-CM | POA: Diagnosis present

## 2023-05-14 DIAGNOSIS — Z85828 Personal history of other malignant neoplasm of skin: Secondary | ICD-10-CM | POA: Diagnosis not present

## 2023-05-14 DIAGNOSIS — N1330 Unspecified hydronephrosis: Secondary | ICD-10-CM | POA: Diagnosis not present

## 2023-05-14 DIAGNOSIS — D509 Iron deficiency anemia, unspecified: Secondary | ICD-10-CM | POA: Diagnosis present

## 2023-05-14 DIAGNOSIS — I420 Dilated cardiomyopathy: Secondary | ICD-10-CM | POA: Diagnosis present

## 2023-05-14 DIAGNOSIS — E871 Hypo-osmolality and hyponatremia: Secondary | ICD-10-CM | POA: Diagnosis present

## 2023-05-14 DIAGNOSIS — K5730 Diverticulosis of large intestine without perforation or abscess without bleeding: Secondary | ICD-10-CM | POA: Diagnosis not present

## 2023-05-14 HISTORY — DX: Unspecified Escherichia coli (E. coli) as the cause of diseases classified elsewhere: B96.20

## 2023-05-14 HISTORY — DX: Bacteremia: R78.81

## 2023-05-14 LAB — CBC WITH DIFFERENTIAL/PLATELET
Abs Immature Granulocytes: 0.09 10*3/uL — ABNORMAL HIGH (ref 0.00–0.07)
Basophils Absolute: 0.1 10*3/uL (ref 0.0–0.1)
Basophils Relative: 1 %
Eosinophils Absolute: 0.1 10*3/uL (ref 0.0–0.5)
Eosinophils Relative: 1 %
HCT: 28.2 % — ABNORMAL LOW (ref 39.0–52.0)
Hemoglobin: 8.4 g/dL — ABNORMAL LOW (ref 13.0–17.0)
Immature Granulocytes: 1 %
Lymphocytes Relative: 14 %
Lymphs Abs: 1.4 10*3/uL (ref 0.7–4.0)
MCH: 26.8 pg (ref 26.0–34.0)
MCHC: 29.8 g/dL — ABNORMAL LOW (ref 30.0–36.0)
MCV: 90.1 fL (ref 80.0–100.0)
Monocytes Absolute: 1.2 10*3/uL — ABNORMAL HIGH (ref 0.1–1.0)
Monocytes Relative: 12 %
Neutro Abs: 7.1 10*3/uL (ref 1.7–7.7)
Neutrophils Relative %: 71 %
Platelets: 220 10*3/uL (ref 150–400)
RBC: 3.13 MIL/uL — ABNORMAL LOW (ref 4.22–5.81)
RDW: 14.5 % (ref 11.5–15.5)
WBC: 10.1 10*3/uL (ref 4.0–10.5)
nRBC: 0 % (ref 0.0–0.2)

## 2023-05-14 LAB — COMPREHENSIVE METABOLIC PANEL
ALT: 13 U/L (ref 0–44)
AST: 17 U/L (ref 15–41)
Albumin: 1.9 g/dL — ABNORMAL LOW (ref 3.5–5.0)
Alkaline Phosphatase: 48 U/L (ref 38–126)
Anion gap: 8 (ref 5–15)
BUN: 17 mg/dL (ref 8–23)
CO2: 26 mmol/L (ref 22–32)
Calcium: 8 mg/dL — ABNORMAL LOW (ref 8.9–10.3)
Chloride: 106 mmol/L (ref 98–111)
Creatinine, Ser: 1.29 mg/dL — ABNORMAL HIGH (ref 0.61–1.24)
GFR, Estimated: 58 mL/min — ABNORMAL LOW (ref >60)
Glucose, Bld: 103 mg/dL — ABNORMAL HIGH (ref 70–99)
Potassium: 4.1 mmol/L (ref 3.5–5.1)
Sodium: 140 mmol/L (ref 135–145)
Total Bilirubin: 0.5 mg/dL (ref <1.2)
Total Protein: 6.4 g/dL — ABNORMAL LOW (ref 6.5–8.1)

## 2023-05-14 LAB — MAGNESIUM: Magnesium: 2.2 mg/dL (ref 1.7–2.4)

## 2023-05-14 MED ORDER — CARVEDILOL 12.5 MG PO TABS
12.5000 mg | ORAL_TABLET | Freq: Two times a day (BID) | ORAL | Status: DC
  Administered 2023-05-14 – 2023-05-16 (×5): 12.5 mg via ORAL
  Filled 2023-05-14 (×5): qty 1

## 2023-05-14 MED ORDER — HYDRALAZINE HCL 20 MG/ML IJ SOLN: 10.0000 mg | INTRAMUSCULAR | Status: DC | PRN

## 2023-05-14 NOTE — ED Notes (Signed)
Pt attempting to get out of bed during rounding. Incontinent episode x 1. Linen and brief changed.

## 2023-05-14 NOTE — ED Notes (Signed)
ED TO INPATIENT HANDOFF REPORT  ED Nurse Name and Phone #:   S Name/Age/Gender Calvin Escobar 72 y.o. male Room/Bed: 009C/009C  Code Status   Code Status: Full Code  Home/SNF/Other Home Patient oriented to: self, place, and time Is this baseline? Yes   Triage Complete: Triage complete  Chief Complaint Syncope [R55]  Triage Note Patient arrived with EMS from home , unwitnessed fall / found on the floor by family , denies injury or pain , CBG=132. Patient unable to recall incident .    Allergies No Known Allergies  Level of Care/Admitting Diagnosis ED Disposition     ED Disposition  Admit   Condition  --   Comment  Hospital Area: [facility] [[id number]]  Level of Care: Progressive [102]  Admit to Progressive based on following criteria: MULTISYSTEM THREATS such as stable sepsis, metabolic/electrolyte imbalance with or without encephalopathy that is responding to early treatment.  May place patient in observation at [facility] or [facility] if equivalent level of care is available:: Yes  Covid Evaluation: Asymptomatic - no recent exposure (last 10 days) testing not required  Diagnosis: Syncope [206001]  Admitting Physician: Ashvin Giovanni [[id number]]  Attending Physician: Dariusz Giovanni [[id number]]          B Medical/Surgery History Past Medical History:  Diagnosis Date   Allergy seasonal   Ascending aorta dilatation (HCC)    38 mm by 2D echo 04/2021   Cancer (HCC) skin   DCM (dilated cardiomyopathy) (HCC)    nonischemic with normal coronary arteries at cath 06/2019.  EF 35 to 40% on echo 04/2021   Dementia (HCC)    Depression    Hyperlipidemia    Lithium toxicity 01/29/2016   OSA treated with BiPAP    SKIN CANCER, HX OF 05/10/2007   Facial left check and forehead and r shoulder  F/w derm   Urinary dribbling 03/14/2022   Past Surgical History:  Procedure Laterality Date   COLONOSCOPY     RIGHT/LEFT HEART CATH AND CORONARY  ANGIOGRAPHY N/A 07/03/2019   Procedure: RIGHT/LEFT HEART CATH AND CORONARY ANGIOGRAPHY;  Surgeon: Dolores Patty, MD;  Location: [facility] INVASIVE CV LAB;  Service: Cardiovascular;  Laterality: N/A;     A IV Location/Drains/Wounds Patient Lines/Drains/Airways Status     Active Line/Drains/Airways     Name Placement date Placement time Site Days   Peripheral IV 05/13/23 18 G Left Antecubital 05/13/23  0005  Antecubital  1            Intake/Output Last 24 hours  Intake/Output Summary (Last 24 hours) at 05/14/2023 1021 Last data filed at 05/14/2023 0256 Gross per 24 hour  Intake 100 ml  Output --  Net 100 ml    Labs/Imaging Results for orders placed or performed during the hospital encounter of 05/12/23 (from the past 48 hour(s))  CBG monitoring, ED     Status: Abnormal   Collection Time: 05/12/23 11:29 PM  Result Value Ref Range   Glucose-Capillary 137 (H) 70 - 99 mg/dL    Comment: Glucose reference range applies only to samples taken after fasting for at least 8 hours.  Urinalysis, w/ Reflex to Culture (Infection Suspected) -Urine, Clean Catch     Status: Abnormal   Collection Time: 05/12/23 11:37 PM  Result Value Ref Range   Specimen Source URINE, CLEAN CATCH    Color, Urine AMBER (A) YELLOW    Comment: BIOCHEMICALS MAY BE AFFECTED BY COLOR   APPearance CLOUDY (A) CLEAR   Specific  Gravity, Urine 1.009 1.005 - 1.030   pH 7.0 5.0 - 8.0   Glucose, UA NEGATIVE NEGATIVE mg/dL   Hgb urine dipstick MODERATE (A) NEGATIVE   Bilirubin Urine NEGATIVE NEGATIVE   Ketones, ur NEGATIVE NEGATIVE mg/dL   Protein, ur 829 (A) NEGATIVE mg/dL   Nitrite NEGATIVE NEGATIVE   Leukocytes,Ua LARGE (A) NEGATIVE   RBC / HPF 6-10 0 - 5 RBC/hpf   WBC, UA >50 0 - 5 WBC/hpf    Comment:        Reflex urine culture not performed if WBC <=10, OR if Squamous epithelial cells >5. If Squamous epithelial cells >5 suggest recollection.    Bacteria, UA MANY (A) NONE SEEN   Squamous Epithelial / HPF  0-5 0 - 5 /HPF   Mucus PRESENT    Non Squamous Epithelial 0-5 (A) NONE SEEN    Comment: Performed at [facility] Lab, [address], [place], [place] [address]  Urine Culture     Status: Abnormal (Preliminary result)   Collection Time: 05/12/23 11:37 PM   Specimen: Urine, Random  Result Value Ref Range   Specimen Description URINE, RANDOM    Special Requests      NONE Reflexed from [phone] Performed at [facility], [address], [place], [place] [address]    Culture >=100,000 COLONIES/mL ESCHERICHIA COLI (A)    Report Status PENDING   CBC with Differential/Platelet     Status: Abnormal   Collection Time: 05/12/23 11:38 PM  Result Value Ref Range   WBC 12.5 (H) 4.0 - 10.5 K/uL   RBC 3.09 (L) 4.22 - 5.81 MIL/uL   Hemoglobin 8.6 (L) 13.0 - 17.0 g/dL   HCT 29.5 (L) 28.4 - 13.2 %   MCV 88.3 80.0 - 100.0 fL   MCH 27.8 26.0 - 34.0 pg   MCHC 31.5 30.0 - 36.0 g/dL   RDW 44.0 10.2 - 72.5 %   Platelets 201 150 - 400 K/uL   nRBC 0.0 0.0 - 0.2 %   Neutrophils Relative % 74 %   Neutro Abs 9.2 (H) 1.7 - 7.7 K/uL   Lymphocytes Relative 13 %   Lymphs Abs 1.6 0.7 - 4.0 K/uL   Monocytes Relative 11 %   Monocytes Absolute 1.4 (H) 0.1 - 1.0 K/uL   Eosinophils Relative 0 %   Eosinophils Absolute 0.0 0.0 - 0.5 K/uL   Basophils Relative 1 %   Basophils Absolute 0.1 0.0 - 0.1 K/uL   Immature Granulocytes 1 %   Abs Immature Granulocytes 0.15 (H) 0.00 - 0.07 K/uL    Comment: Performed at [facility], [address] [place], [place] [address]  Comprehensive metabolic panel     Status: Abnormal   Collection Time: 05/12/23 11:38 PM  Result Value Ref Range   Sodium 133 (L) 135 - 145 mmol/L   Potassium 4.3 3.5 - 5.1 mmol/L   Chloride 98 98 - 111 mmol/L   CO2 24 22 - 32 mmol/L   Glucose, Bld 130 (H) 70 - 99 mg/dL    Comment: Glucose reference range applies only to samples taken after fasting for at least 8 hours.   BUN 26 (H) 8 - 23 mg/dL   Creatinine, Ser 0.34 (H) 0.61 - 1.24  mg/dL   Calcium 8.3 (L) 8.9 - 10.3 mg/dL   Total Protein 6.7 6.5 - 8.1 g/dL   Albumin 1.6 (L) 3.5 - 5.0 g/dL   AST 22 15 - 41 U/L   ALT  14 0 - 44 U/L   Alkaline Phosphatase 51 38 - 126 U/L   Total Bilirubin 0.4 <1.2 mg/dL   GFR, Estimated 34 (L) >60 mL/min    Comment: (NOTE) Calculated using the CKD-EPI Creatinine Equation (2021)    Anion gap 11 5 - 15    Comment: Performed at [facility], [address], [place], [place] [address]  CK     Status: Abnormal   Collection Time: 05/12/23 11:38 PM  Result Value Ref Range   Total CK 27 (L) 49 - 397 U/L    Comment: Performed at [facility], [address] [place], [place] [address]  I-Stat CG4 Lactic Acid     Status: Abnormal   Collection Time: 05/12/23 11:49 PM  Result Value Ref Range   Lactic Acid, Venous 2.5 (HH) 0.5 - 1.9 mmol/L   Comment NOTIFIED PHYSICIAN   Culture, blood (Routine X 2) w Reflex to ID Panel     Status: None (Preliminary result)   Collection Time: 05/13/23 12:10 AM   Specimen: BLOOD  Result Value Ref Range   Specimen Description BLOOD LEFT ARM    Special Requests      BOTTLES DRAWN AEROBIC AND ANAEROBIC Blood Culture adequate volume   Culture  Setup Time      GRAM NEGATIVE RODS AEROBIC BOTTLE ONLY CRITICAL RESULT CALLED TO, READ BACK BY AND VERIFIED WITH: Oda Kilts [phone] @ 2204 FH Performed at [facility], [address] [place], [place] [address]    Culture GRAM NEGATIVE RODS    Report Status PENDING   I-Stat CG4 Lactic Acid     Status: Abnormal   Collection Time: 05/13/23  2:22 AM  Result Value Ref Range   Lactic Acid, Venous 3.7 (HH) 0.5 - 1.9 mmol/L   Comment NOTIFIED PHYSICIAN   Culture, blood (Routine X 2) w Reflex to ID Panel     Status: Abnormal (Preliminary result)   Collection Time: 05/13/23  2:40 AM   Specimen: BLOOD  Result Value Ref Range   Specimen Description BLOOD RIGHT ANTECUBITAL    Special Requests      BOTTLES DRAWN AEROBIC AND ANAEROBIC Blood  Culture adequate volume   Culture  Setup Time      GRAM NEGATIVE RODS AEROBIC BOTTLE ONLY CRITICAL RESULT CALLED TO, READ BACK BY AND VERIFIED WITH: Oda Kilts [phone] @ 2204 FH    Culture (A)     ESCHERICHIA COLI SUSCEPTIBILITIES TO FOLLOW Performed at [facility], [address] [place], [place] [address]    Report Status PENDING   Blood Culture ID Panel (Reflexed)     Status: Abnormal   Collection Time: 05/13/23  2:40 AM  Result Value Ref Range   Enterococcus faecalis NOT DETECTED NOT DETECTED   Enterococcus Faecium NOT DETECTED NOT DETECTED   Listeria monocytogenes NOT DETECTED NOT DETECTED   Staphylococcus species NOT DETECTED NOT DETECTED   Staphylococcus aureus (BCID) NOT DETECTED NOT DETECTED   Staphylococcus epidermidis NOT DETECTED NOT DETECTED   Staphylococcus lugdunensis NOT DETECTED NOT DETECTED   Streptococcus species NOT DETECTED NOT DETECTED   Streptococcus agalactiae NOT DETECTED NOT DETECTED   Streptococcus pneumoniae NOT DETECTED NOT DETECTED   Streptococcus pyogenes NOT DETECTED NOT DETECTED   A.calcoaceticus-baumannii NOT DETECTED NOT DETECTED   Bacteroides fragilis NOT DETECTED NOT DETECTED   Enterobacterales DETECTED (A) NOT DETECTED    Comment: Enterobacterales represent a large order of gram negative bacteria, not a single  organism. CRITICAL RESULT CALLED TO, READ BACK BY AND VERIFIED WITH: PHARMD AClinton Sawyer [phone] @ 2204 FH    Enterobacter cloacae complex NOT DETECTED NOT DETECTED   Escherichia coli DETECTED (A) NOT DETECTED    Comment: CRITICAL RESULT CALLED TO, READ BACK BY AND VERIFIED WITH: PHARMD Ellen Henri [phone] @ 2204 FH    Klebsiella aerogenes NOT DETECTED NOT DETECTED   Klebsiella oxytoca NOT DETECTED NOT DETECTED   Klebsiella pneumoniae NOT DETECTED NOT DETECTED   Proteus species NOT DETECTED NOT DETECTED   Salmonella species NOT DETECTED NOT DETECTED   Serratia marcescens NOT DETECTED NOT DETECTED   Haemophilus  influenzae NOT DETECTED NOT DETECTED   Neisseria meningitidis NOT DETECTED NOT DETECTED   Pseudomonas aeruginosa NOT DETECTED NOT DETECTED   Stenotrophomonas maltophilia NOT DETECTED NOT DETECTED   Candida albicans NOT DETECTED NOT DETECTED   Candida auris NOT DETECTED NOT DETECTED   Candida glabrata NOT DETECTED NOT DETECTED   Candida krusei NOT DETECTED NOT DETECTED   Candida parapsilosis NOT DETECTED NOT DETECTED   Candida tropicalis NOT DETECTED NOT DETECTED   Cryptococcus neoformans/gattii NOT DETECTED NOT DETECTED   CTX-M ESBL NOT DETECTED NOT DETECTED   Carbapenem resistance IMP NOT DETECTED NOT DETECTED   Carbapenem resistance KPC NOT DETECTED NOT DETECTED   Carbapenem resistance NDM NOT DETECTED NOT DETECTED   Carbapenem resist OXA 48 LIKE NOT DETECTED NOT DETECTED   Carbapenem resistance VIM NOT DETECTED NOT DETECTED    Comment: Performed at [facility], [address] [place], [place] [address]  Lactic acid, plasma     Status: None   Collection Time: 05/13/23  4:26 AM  Result Value Ref Range   Lactic Acid, Venous 0.8 0.5 - 1.9 mmol/L    Comment: Performed at [facility], [address], [place], [place] [address]  Brain natriuretic peptide     Status: None   Collection Time: 05/13/23  6:45 AM  Result Value Ref Range   B Natriuretic Peptide 83.1 0.0 - 100.0 pg/mL    Comment: Performed at [facility], [address] [place], [place] [address]  Basic metabolic panel     Status: Abnormal   Collection Time: 05/13/23  6:47 AM  Result Value Ref Range   Sodium 139 135 - 145 mmol/L   Potassium 4.2 3.5 - 5.1 mmol/L   Chloride 99 98 - 111 mmol/L   CO2 25 22 - 32 mmol/L   Glucose, Bld 108 (H) 70 - 99 mg/dL    Comment: Glucose reference range applies only to samples taken after fasting for at least 8 hours.   BUN 24 (H) 8 - 23 mg/dL   Creatinine, Ser 1.09 (H) 0.61 - 1.24 mg/dL   Calcium 7.7 (L) 8.9 - 10.3 mg/dL   GFR, Estimated 43 (L) >60 mL/min     Comment: (NOTE) Calculated using the CKD-EPI Creatinine Equation (2021)    Anion gap 15 5 - 15    Comment: Performed at [facility], [address] [place], [place] [address]  CBC     Status: Abnormal   Collection Time: 05/13/23  6:47 AM  Result Value Ref Range   WBC 11.9 (H) 4.0 - 10.5 K/uL   RBC 3.14 (L) 4.22 - 5.81 MIL/uL   Hemoglobin 8.5 (L) 13.0 - 17.0 g/dL   HCT 73.2 (L) 20.2 - 54.2 %   MCV 90.4 80.0 - 100.0 fL   MCH 27.1 26.0 - 34.0 pg  MCHC 29.9 (L) 30.0 - 36.0 g/dL   RDW 16.1 09.6 - 04.5 %   Platelets 194 150 - 400 K/uL   nRBC 0.0 0.0 - 0.2 %    Comment: Performed at [facility], [address] [place], [place] [address]  Troponin I (High Sensitivity)     Status: None   Collection Time: 05/13/23  6:47 AM  Result Value Ref Range   Troponin I (High Sensitivity) 8 <18 ng/L    Comment: (NOTE) Elevated high sensitivity troponin I (hsTnI) values and significant  changes across serial measurements may suggest ACS but many other  chronic and acute conditions are known to elevate hsTnI results.  Refer to the "Links" section for chest pain algorithms and additional  guidance. Performed at [facility], [address], [place], [place] [address]   Iron and TIBC     Status: Abnormal   Collection Time: 05/13/23  6:47 AM  Result Value Ref Range   Iron 18 (L) 45 - 182 ug/dL   TIBC 829 (L) 562 - 130 ug/dL   Saturation Ratios 14 (L) 17.9 - 39.5 %   UIBC 111 ug/dL    Comment: Performed at [facility], [address], [place], [place] [address]  Ferritin     Status: Abnormal   Collection Time: 05/13/23  6:47 AM  Result Value Ref Range   Ferritin 1,430 (H) 24 - 336 ng/mL    Comment: Performed at [facility], [address] [place], [place] [address]  Vitamin B12     Status: None   Collection Time: 05/13/23  6:48 AM  Result Value Ref Range   Vitamin B-12 407 180 - 914 pg/mL    Comment: (NOTE) This assay is not validated for testing neonatal  or myeloproliferative syndrome specimens for Vitamin B12 levels. Performed at [facility], [address] [place], [place] [address]   Folate     Status: None   Collection Time: 05/13/23  6:48 AM  Result Value Ref Range   Folate 20.5 >5.9 ng/mL    Comment: Performed at [facility], [address] [place], [place] [address]  Reticulocytes     Status: Abnormal   Collection Time: 05/13/23  6:48 AM  Result Value Ref Range   Retic Ct Pct 2.0 0.4 - 3.1 %   RBC. 3.12 (L) 4.22 - 5.81 MIL/uL   Retic Count, Absolute 61.5 19.0 - 186.0 K/uL   Immature Retic Fract 26.9 (H) 2.3 - 15.9 %    Comment: Performed at [facility], [address], [place], [place] [address]  Comprehensive metabolic panel     Status: Abnormal   Collection Time: 05/14/23  4:02 AM  Result Value Ref Range   Sodium 140 135 - 145 mmol/L   Potassium 4.1 3.5 - 5.1 mmol/L   Chloride 106 98 - 111 mmol/L   CO2 26 22 - 32 mmol/L   Glucose, Bld 103 (H) 70 - 99 mg/dL    Comment: Glucose reference range applies only to samples taken after fasting for at least 8 hours.   BUN 17 8 - 23 mg/dL   Creatinine, Ser 2.53 (H) 0.61 - 1.24 mg/dL   Calcium 8.0 (L) 8.9 - 10.3 mg/dL   Total Protein 6.4 (L) 6.5 - 8.1 g/dL   Albumin 1.9 (L) 3.5 - 5.0 g/dL   AST 17 15 - 41 U/L   ALT 13 0 - 44 U/L  Alkaline Phosphatase 48 38 - 126 U/L   Total Bilirubin 0.5 <1.2 mg/dL   GFR, Estimated 58 (L) >60 mL/min    Comment: (NOTE) Calculated using the CKD-EPI Creatinine Equation (2021)    Anion gap 8 5 - 15    Comment: Performed at [facility], [address] [place], [place] [address]  Magnesium     Status: None   Collection Time: 05/14/23  4:02 AM  Result Value Ref Range   Magnesium 2.2 1.7 - 2.4 mg/dL    Comment: Performed at [facility], [address] [place], [place] [address]  CBC with Differential/Platelet     Status: Abnormal   Collection Time: 05/14/23  4:02 AM  Result Value Ref Range   WBC 10.1 4.0  - 10.5 K/uL   RBC 3.13 (L) 4.22 - 5.81 MIL/uL   Hemoglobin 8.4 (L) 13.0 - 17.0 g/dL   HCT 63.8 (L) 75.6 - 43.3 %   MCV 90.1 80.0 - 100.0 fL   MCH 26.8 26.0 - 34.0 pg   MCHC 29.8 (L) 30.0 - 36.0 g/dL   RDW 29.5 18.8 - 41.6 %   Platelets 220 150 - 400 K/uL   nRBC 0.0 0.0 - 0.2 %   Neutrophils Relative % 71 %   Neutro Abs 7.1 1.7 - 7.7 K/uL   Lymphocytes Relative 14 %   Lymphs Abs 1.4 0.7 - 4.0 K/uL   Monocytes Relative 12 %   Monocytes Absolute 1.2 (H) 0.1 - 1.0 K/uL   Eosinophils Relative 1 %   Eosinophils Absolute 0.1 0.0 - 0.5 K/uL   Basophils Relative 1 %   Basophils Absolute 0.1 0.0 - 0.1 K/uL   Immature Granulocytes 1 %   Abs Immature Granulocytes 0.09 (H) 0.00 - 0.07 K/uL    Comment: Performed at [facility], [address] [place], [place] [address]   ECHOCARDIOGRAM COMPLETE  Result Date: 05/13/2023    ECHOCARDIOGRAM REPORT   Patient Name:   Calvin Escobar Date of Exam: 05/13/2023 Medical Rec #:  [id number]   Height:       66.0 in Accession #:    [id number]  Weight:       241.0 lb Date of Birth:  1950/08/08   BSA:          2.165 m Patient Age:    72 years    BP:           126/73 mmHg Patient Gender: M           HR:           77 bpm. Exam Location:  Inpatient Procedure: 2D Echo, Cardiac Doppler, Color Doppler and Intracardiac            Opacification Agent Indications:    Syncope  History:        Patient has prior history of Echocardiogram examinations, most                 recent 03/15/2023. Risk Factors:Hypertension, Dyslipidemia and                 Sleep Apnea.  Sonographer:    Karma Ganja Referring Phys: [id number] SEGBTDVVO RATHORE  Sonographer Comments: Technically difficult study due to poor echo windows. Image acquisition challenging due to respiratory motion. IMPRESSIONS  1. Abnormal septal motion. Left ventricular ejection fraction, by estimation, is 50 to 55%. The left ventricle has low normal function. The left ventricle has no regional wall motion abnormalities.  There is  mild left ventricular hypertrophy. Left ventricular diastolic parameters were normal.  2. Right ventricular systolic function is normal. The right ventricular size is normal. There is normal pulmonary artery systolic pressure.  3. The mitral valve is normal in structure. No evidence of mitral valve regurgitation. No evidence of mitral stenosis.  4. The aortic valve is tricuspid. There is moderate calcification of the aortic valve. There is moderate thickening of the aortic valve. Aortic valve regurgitation is not visualized. Aortic valve sclerosis is present, with no evidence of aortic valve stenosis.  5. The inferior vena cava is normal in size with greater than 50% respiratory variability, suggesting right atrial pressure of 3 mmHg. FINDINGS  Left Ventricle: Abnormal septal motion. Left ventricular ejection fraction, by estimation, is 50 to 55%. The left ventricle has low normal function. The left ventricle has no regional wall motion abnormalities. The left ventricular internal cavity size was normal in size. There is mild left ventricular hypertrophy. Left ventricular diastolic parameters were normal. Right Ventricle: The right ventricular size is normal. No increase in right ventricular wall thickness. Right ventricular systolic function is normal. There is normal pulmonary artery systolic pressure. The tricuspid regurgitant velocity is 2.74 m/s, and  with an assumed right atrial pressure of 3 mmHg, the estimated right ventricular systolic pressure is 33.0 mmHg. Left Atrium: Left atrial size was normal in size. Right Atrium: Right atrial size was normal in size. Pericardium: There is no evidence of pericardial effusion. Mitral Valve: The mitral valve is normal in structure. No evidence of mitral valve regurgitation. No evidence of mitral valve stenosis. Tricuspid Valve: The tricuspid valve is normal in structure. Tricuspid valve regurgitation is not demonstrated. No evidence of tricuspid stenosis. Aortic Valve:  The aortic valve is tricuspid. There is moderate calcification of the aortic valve. There is moderate thickening of the aortic valve. Aortic valve regurgitation is not visualized. Aortic valve sclerosis is present, with no evidence of aortic valve stenosis. Aortic valve mean gradient measures 4.0 mmHg. Aortic valve peak gradient measures 6.4 mmHg. Aortic valve area, by VTI measures 2.71 cm. Pulmonic Valve: The pulmonic valve was normal in structure. Pulmonic valve regurgitation is not visualized. No evidence of pulmonic stenosis. Aorta: The aortic root is normal in size and structure. Venous: The inferior vena cava is normal in size with greater than 50% respiratory variability, suggesting right atrial pressure of 3 mmHg. IAS/Shunts: No atrial level shunt detected by color flow Doppler.  LEFT VENTRICLE PLAX 2D LVIDd:         5.00 cm      Diastology LVIDs:         3.90 cm      LV e' medial:    7.29 cm/s LV PW:         1.20 cm      LV E/e' medial:  9.3 LV IVS:        1.30 cm      LV e' lateral:   15.10 cm/s LVOT diam:     2.10 cm      LV E/e' lateral: 4.5 LV SV:         64 LV SV Index:   29 LVOT Area:     3.46 cm  LV Volumes (MOD) LV vol d, MOD A2C: 113.0 ml LV vol d, MOD A4C: 139.0 ml LV vol s, MOD A2C: 56.9 ml LV vol s, MOD A4C: 67.0 ml LV SV MOD A2C:     56.1 ml LV SV MOD A4C:  139.0 ml LV SV MOD BP:      64.9 ml RIGHT VENTRICLE RV Basal diam:  3.70 cm RV S prime:     19.10 cm/s TAPSE (M-mode): 3.2 cm LEFT ATRIUM             Index        RIGHT ATRIUM           Index LA diam:        3.70 cm 1.71 cm/m   RA Area:     15.00 cm LA Vol (A2C):   74.3 ml 34.32 ml/m  RA Volume:   31.00 ml  14.32 ml/m LA Vol (A4C):   52.9 ml 24.44 ml/m LA Biplane Vol: 67.8 ml 31.32 ml/m  AORTIC VALVE AV Area (Vmax):    2.75 cm AV Area (Vmean):   2.66 cm AV Area (VTI):     2.71 cm AV Vmax:           126.00 cm/s AV Vmean:          86.900 cm/s AV VTI:            0.235 m AV Peak Grad:      6.4 mmHg AV Mean Grad:      4.0 mmHg LVOT  Vmax:         100.00 cm/s LVOT Vmean:        66.700 cm/s LVOT VTI:          0.184 m LVOT/AV VTI ratio: 0.78  AORTA Ao Root diam: 3.80 cm Ao Asc diam:  3.90 cm MITRAL VALVE               TRICUSPID VALVE MV Area (PHT): 3.93 cm    TR Peak grad:   30.0 mmHg MV Decel Time: 193 msec    TR Vmax:        274.00 cm/s MV E velocity: 67.90 cm/s MV A velocity: 83.90 cm/s  SHUNTS MV E/A ratio:  0.81        Systemic VTI:  0.18 m                            Systemic Diam: 2.10 cm Charlton Haws MD Electronically signed by Charlton Haws MD Signature Date/Time: 05/13/2023/10:16:49 AM    Final    CT HEAD WO CONTRAST ( )  Result Date: 05/13/2023 CLINICAL DATA:  Unwitnessed fall, altered mental status EXAM: CT HEAD WITHOUT CONTRAST CT CERVICAL SPINE WITHOUT CONTRAST TECHNIQUE: Multidetector CT imaging of the head and cervical spine was performed following the standard protocol without intravenous contrast. Multiplanar CT image reconstructions of the cervical spine were also generated. RADIATION DOSE REDUCTION: This exam was performed according to the departmental dose-optimization program which includes automated exposure control, adjustment of the mA and/or kV according to patient size and/or use of iterative reconstruction technique. COMPARISON:  CT head dated 01/29/2016 FINDINGS: CT HEAD FINDINGS Brain: No evidence of acute infarction, hemorrhage, hydrocephalus, extra-axial collection or mass lesion/mass effect. Global cortical atrophy. Mild subcortical white matter and periventricular small vessel ischemic changes. Vascular: No hyperdense vessel or unexpected calcification. Skull: Normal. Negative for fracture or focal lesion. Sinuses/Orbits: The visualized paranasal sinuses are essentially clear. The mastoid air cells are unopacified. Other: None. CT CERVICAL SPINE FINDINGS Alignment: Loss of the normal cervical lordosis. Skull base and vertebrae: No acute fracture. No primary bone lesion or focal pathologic process. Soft tissues  and spinal canal: No prevertebral fluid or swelling. No visible  canal hematoma. Disc levels: Mild degenerative changes of the mid/lower cervical spine. Spinal canal is patent. Upper chest: Negative. Other: None. IMPRESSION: No acute intracranial abnormality. Atrophy with small vessel ischemic changes. No traumatic injury to the cervical spine. Mild degenerative changes. Electronically Signed   By: Charline Bills M.D.   On: 05/13/2023 00:24   CT CERVICAL SPINE WO CONTRAST  Result Date: 05/13/2023 CLINICAL DATA:  Unwitnessed fall, altered mental status EXAM: CT HEAD WITHOUT CONTRAST CT CERVICAL SPINE WITHOUT CONTRAST TECHNIQUE: Multidetector CT imaging of the head and cervical spine was performed following the standard protocol without intravenous contrast. Multiplanar CT image reconstructions of the cervical spine were also generated. RADIATION DOSE REDUCTION: This exam was performed according to the departmental dose-optimization program which includes automated exposure control, adjustment of the mA and/or kV according to patient size and/or use of iterative reconstruction technique. COMPARISON:  CT head dated 01/29/2016 FINDINGS: CT HEAD FINDINGS Brain: No evidence of acute infarction, hemorrhage, hydrocephalus, extra-axial collection or mass lesion/mass effect. Global cortical atrophy. Mild subcortical white matter and periventricular small vessel ischemic changes. Vascular: No hyperdense vessel or unexpected calcification. Skull: Normal. Negative for fracture or focal lesion. Sinuses/Orbits: The visualized paranasal sinuses are essentially clear. The mastoid air cells are unopacified. Other: None. CT CERVICAL SPINE FINDINGS Alignment: Loss of the normal cervical lordosis. Skull base and vertebrae: No acute fracture. No primary bone lesion or focal pathologic process. Soft tissues and spinal canal: No prevertebral fluid or swelling. No visible canal hematoma. Disc levels: Mild degenerative changes of the  mid/lower cervical spine. Spinal canal is patent. Upper chest: Negative. Other: None. IMPRESSION: No acute intracranial abnormality. Atrophy with small vessel ischemic changes. No traumatic injury to the cervical spine. Mild degenerative changes. Electronically Signed   By: Charline Bills M.D.   On: 05/13/2023 00:24   DG Chest Port 1 View  Result Date: 05/13/2023 CLINICAL DATA:  Un witnessed fall, initial encounter EXAM: PORTABLE CHEST 1 VIEW COMPARISON:  01/29/16 FINDINGS: Cardiac shadow is mildly prominent but stable. Lungs are hypoinflated. Calcified granuloma is noted in the right mid lung stable from the prior exam. No acute bony abnormality is seen. IMPRESSION: Changes of prior granulomatous disease. No acute abnormality noted. Electronically Signed   By: Alcide Clever M.D.   On: 05/13/2023 00:03    Pending Labs Unresulted Labs (From admission, onward)     Start     Ordered   05/13/23 0647  C Difficile Quick Screen w PCR reflex  (C Difficile quick screen w PCR reflex panel )  Once, for 24 hours,   TIMED       References:    CDiff Information Tool   05/13/23 0647   05/13/23 0647  Gastrointestinal Panel by PCR , Stool  (Gastrointestinal Panel by PCR, Stool                                                                                                                                                     **  Does Not include CLOSTRIDIUM DIFFICILE testing. **If CDIFF testing is needed, place order from the "C Difficile Testing" order set.**)  Once,   R        05/13/23 0647   05/13/23 0645  Occult blood card to lab, stool  Once,   R        05/13/23 0647            Vitals/Pain Today's Vitals   05/14/23 0230 05/14/23 0410 05/14/23 0700 05/14/23 0730  BP: 119/73 (!) 133/93 111/68 111/68  Pulse: 97 (!) 105 79 79  Resp:  18 19 (!) 26  Temp:    98 F (36.7 C)  TempSrc:    Oral  SpO2: 94% 99% 97% 95%  PainSc:        Isolation Precautions Enteric precautions (UV  disinfection)  Medications Medications  cefTRIAXone (ROCEPHIN) 2 g in sodium chloride 0.9 % 100 mL IVPB (0 g Intravenous Stopped 05/14/23 0256)  acetaminophen (TYLENOL) tablet 650 mg (has no administration in time range)    Or  acetaminophen (TYLENOL) suppository 650 mg (has no administration in time range)  0.9 %  sodium chloride infusion (0 mLs Intravenous Stopped 05/13/23 1900)  perflutren lipid microspheres (DEFINITY) IV suspension ( Intravenous Canceled Entry 05/13/23 1706)  ARIPiprazole (ABILIFY) tablet 2 mg (2 mg Oral Given 05/14/23 0909)  busPIRone (BUSPAR) tablet 10 mg (10 mg Oral Given 05/14/23 0909)  divalproex (DEPAKOTE ER) 24 hr tablet 500 mg (500 mg Oral Given 05/13/23 2153)  gabapentin (NEURONTIN) capsule 300 mg (300 mg Oral Given 05/13/23 2153)  LORazepam (ATIVAN) tablet 0.5 mg (has no administration in time range)  mirtazapine (REMERON) tablet 30 mg (30 mg Oral Given 05/13/23 2153)  pravastatin (PRAVACHOL) tablet 20 mg (20 mg Oral Given 05/13/23 2153)  primidone (MYSOLINE) tablet 50 mg (has no administration in time range)  tamsulosin (FLOMAX) capsule 0.4 mg (0.4 mg Oral Given 05/14/23 0909)  traZODone (DESYREL) tablet 300 mg (300 mg Oral Given 05/13/23 2153)  venlafaxine XR (EFFEXOR-XR) 24 hr capsule 300 mg (300 mg Oral Given 05/14/23 0909)  sodium chloride 0.9 % bolus 1,000 mL (0 mLs Intravenous Stopped 05/13/23 0100)  cefTRIAXone (ROCEPHIN) 2 g in sodium chloride 0.9 % 100 mL IVPB (0 g Intravenous Stopped 05/13/23 0308)  sodium chloride 0.9 % bolus 1,000 mL (0 mLs Intravenous Stopped 05/13/23 0307)  sodium chloride 0.9 % bolus 1,000 mL (0 mLs Intravenous Stopped 05/13/23 0422)    Mobility walks     Focused Assessments Renal Assessment Handoff:  Hemodialysis Schedule:  Last Hemodialysis date and time:    Restricted appendage:     R Recommendations: See Admitting Provider Note  Report given to:   Additional Notes:

## 2023-05-14 NOTE — Plan of Care (Signed)

## 2023-05-14 NOTE — TOC CM/SW Note (Signed)
Transition of Care [facility]) - Inpatient Brief Assessment   Patient Details  Name: Calvin Escobar MRN: [id number] Date of Birth: Sep 08, 1950  [facility] CM/SW Contact:    Mearl Latin, LCSW Phone Number: 05/14/2023, 2:44 PM   Clinical Narrative: Patient admitted from home with spouse undergoing workup for syncope. No current TOC needs identified but please place consult if needs arise.    Transition of Care Asessment: Insurance and Status: Insurance coverage has been reviewed Patient has primary care physician: Yes Home environment has been reviewed: From home Prior level of function:: Independent Prior/Current Home Services: No current home services Social Determinants of Health Reivew: SDOH reviewed no interventions necessary Readmission risk has been reviewed: Yes Transition of care needs: no transition of care needs at this time

## 2023-05-14 NOTE — Progress Notes (Signed)
RT at bedside to assess pt. Pt sleeping at this time. Pt in no distress. No BiPAP in room.

## 2023-05-14 NOTE — Progress Notes (Signed)
   05/14/23 2300  BiPAP/CPAP/SIPAP  Reason BIPAP/CPAP not in use Non-compliant (Pt states he does not need to wear one any longer and declined our equip)

## 2023-05-14 NOTE — Assessment & Plan Note (Addendum)
05-14-2023 admission blood cx growing E. Coli. Remains on IV rocephin 2 gm qday. Awaiting final sensitivities.

## 2023-05-14 NOTE — Progress Notes (Signed)
PROGRESS NOTE    Calvin Escobar  ZOX:[id number] DOB: 1950/06/25 DOA: 05/12/2023 PCP: [name], MD  Subjective: Pt seen and examined.  Pt transferred to 5-W. Blood and urine cx both growing E. Coli. Changed to inpatient admission. Remains on IV Rocephin 2 gram daily. Scr down to 1.29, BUN 17.  Yesterday Scr 1.67 and BUN 24.   Hospital Course: HPI: Calvin Escobar is a 72 y.o. male with medical history significant of dementia, chronic HFrEF, bipolar affective disorder, tremors, hypertension, hyperlipidemia, OSA on BiPAP, BPH presented to ED via EMS after an unwitnessed fall at home and was found on the floor by family.  Patient was confused on arrival to the ED and did not remember falling.  Afebrile and not tachycardic.  Hypotensive with systolic as low as 80s.  Not hypoxic.  Labs notable for WBC count 12.5, hemoglobin 8.6 (previously 10.1 on labs 5 days ago and 12.5 on August 2024), MCV 88.3, sodium 133, glucose 130, BUN 26, creatinine 2.0 (baseline 1.1), calcium 8.3, albumin 1.6, normal LFTs, CK 27, i-STAT lactate 2.5> 3.7, blood cultures collected.  UA with negative nitrite, large amount of leukocytes, and microscopy showing 6-10 RBCs, >50 WBCs, and many bacteria.  Urine culture pending.  Chest x-ray showing changes of prior granulomatous disease and no acute abnormality.  CT head negative for acute intracranial abnormality.  CT C-spine negative for acute traumatic injury.  EKG showing sinus rhythm and no acute ischemic changes. Patient was given ceftriaxone and 3 L normal saline.  Blood pressure improved after IV fluid boluses.  Repeat lactate 0.8.  TRH called to admit.     Patient states he had a fall at home but he does not remember when exactly this happened.  He does remember that he was dizzy and walking down some steps but does not remember what happened next.  He is not sure if he passed out or hit his head.  Reporting poor p.o. intake.  States he recently had lab work done by his PCP  and was told that he was anemic.  Denies hematemesis, hematochezia, or melena.  He is having nonbloody watery diarrhea for the past few days.  Denies fevers, cough, shortness of breath, chest pain, nausea, vomiting, or abdominal pain.  Denies dysuria or urinary frequency/urgency.    Significant Events: Admitted 05/12/2023 for syncope, uti, aki   Significant Labs: Admission WBC count 12.5, hemoglobin 8.6 (previously 10.1 on labs 5 days ago and 12.5 on August 2024), MCV 88.3, sodium 133, glucose 130, BUN 26, creatinine 2.0 (baseline 1.1), calcium 8.3, albumin 1.6, normal LFTs, CK 27, i-STAT lactate 2.5> 3.7  Significant Imaging Studies: Admission CT head/c-spine showed No acute intracranial abnormality. Atrophy with small vessel ischemic changes. No traumatic injury to the cervical spine. Mild degenerative changes.  Antibiotic Therapy: Anti-infectives (From admission, onward)    Start     Dose/Rate Route Frequency Ordered Stop   05/14/23 0200  cefTRIAXone (ROCEPHIN) 2 g in sodium chloride 0.9 % 100 mL IVPB        2 g 200 mL/hr over 30 Minutes Intravenous Every 24 hours 05/13/23 0647     05/13/23 0200  cefTRIAXone (ROCEPHIN) 2 g in sodium chloride 0.9 % 100 mL IVPB        2 g 200 mL/hr over 30 Minutes Intravenous  Once 05/13/23 0158 05/13/23 0308       Procedures:   Consultants:     Assessment and Plan: * Syncope 05-13-2023 admitted after syncopal episode. Pt states  he has been feeling poorly for several days to 1-2 week prior to admission. No head trauma. Like due to dehydration. Echo shows improved LVEF now 55%  E coli bacteremia 05-14-2023 admission blood cx growing E. Coli. Remains on IV rocephin 2 gm qday. Awaiting final sensitivities.  Hyponatremia 05-13-2023 resolved with IVF. Likely hypovolemic hyponatremia.  05-14-2023 resolved after IVF. Hyponatremia due to hypovolemia.  AKI (acute kidney injury) (HCC) 05-13-2023 admission Scr of 2.04. after IVF overnight, scr down  to 1.67. baseline is 1.0. continue IVF for now.  05-14-2023 Scr 1.29 today. Pt is eating and drinking. Stop IVF so he does not get volume overloaded.  Diarrhea 05-13-2023 awaiting GI diarrhea panel and C. Diff panel. Doubt C. Diff. 05-14-2023 awaiting c. Diff and GI diarrhea panel.  Acute cystitis without hematuria 05-13-2023 continue with IV rocephin. Awaiting urine cx 05-14-2023 E.coli from blood cx. Urine cx growing e. Coli. Awaiting final sensitivities. Continue IV rocephin 2 gram daily.  HFrEF (heart failure with reduced ejection fraction) (HCC) 05-13-2023 echo today shows LVEF has improved to 55%. Restart coreg after AKI has resolved. 05-2023 restart coreg 12.5 mg bid. [name] has improved.  Obesity, Class II, BMI 35-39.9 05-13-2023 BMI 38.90  Normocytic anemia 05-13-2023 iron studies show iron deficiency. Will need to start oral iron at discharge. He will need GI referral at discharge. Iron/TIBC/Ferritin/ %Sat    Component Value Date/Time   IRON 18 (L) 05/13/2023 0647   TIBC 129 (L) 05/13/2023 0647   FERRITIN 1,430 (H) 05/13/2023 0647   IRONPCTSAT 14 (L) 05/13/2023 0647    OSA treated with BiPAP 05-13-2023 stable.  Dementia (HCC) 05-13-2023 stable.   DVT prophylaxis: SCDs Start: 05/13/23 0644    Code Status: Full Code Family Communication: no family at bedside. Attempted to call pt's wife Kerby Moors [phone] twice without success in reaching her. Phone call goes straight to VM Disposition Plan: return home Reason for continuing need for hospitalization: remains on IV Rocephin. Awaiting final blood cx and urine cx sensitivities.  Objective: Vitals:   05/14/23 0730 05/14/23 1038 05/14/23 1127 05/14/23 1229  BP: 111/68 (!) 118/95  (!) 173/66  Pulse: 79 (!) 105 71 79  Resp: (!) 26 (!) 26 (!) 25 16  Temp: 98 F (36.7 C)  98 F (36.7 C) (!) 97.5 F (36.4 C)  TempSrc: Oral   Oral  SpO2: 95% 98% 96% 95%    Intake/Output Summary (Last 24 hours) at 05/14/2023 1503 Last  data filed at 05/14/2023 0256 Gross per 24 hour  Intake 100 ml  Output --  Net 100 ml   There were no vitals filed for this visit.  Examination:  Physical Exam Vitals and nursing note reviewed.  Constitutional:      General: He is not in acute distress.    Appearance: He is obese. He is not toxic-appearing.     Comments: Looks better today. Better facial color today. He is chronically ill appearing  HENT:     Head: Normocephalic and atraumatic.     Nose: Nose normal.  Eyes:     General: No scleral icterus. Cardiovascular:     Rate and Rhythm: Normal rate and regular rhythm.  Pulmonary:     Effort: Pulmonary effort is normal.     Breath sounds: Normal breath sounds.  Abdominal:     General: Abdomen is protuberant. Bowel sounds are normal. There is no distension.     Palpations: Abdomen is soft.  Musculoskeletal:     Right lower leg: Edema present.  Left lower leg: Edema present.     Comments: Trace pedal/pretibial edema  Skin:    General: Skin is warm and dry.     Capillary Refill: Capillary refill takes less than 2 seconds.  Neurological:     Mental Status: He is alert.     Data Reviewed: I have personally reviewed following labs and imaging studies  CBC: Recent Labs  Lab 05/08/23 1402 05/12/23 2338 05/13/23 0647 05/14/23 0402  WBC 6.5 12.5* 11.9* 10.1  NEUTROABS  --  9.2*  --  7.1  HGB 10.1* 8.6* 8.5* 8.4*  HCT 30.9* 27.3* 28.4* 28.2*  MCV 85.6 88.3 90.4 90.1  PLT 249.0 201 194 220   Basic Metabolic Panel: Recent Labs  Lab 05/12/23 2338 05/13/23 0647 05/14/23 0402  NA 133* 139 140  K 4.3 4.2 4.1  CL 98 99 106  CO2 24 25 26   GLUCOSE 130* 108* 103*  BUN 26* 24* 17  CREATININE 2.04* 1.67* 1.29*  CALCIUM 8.3* 7.7* 8.0*  MG  --   --  2.2   GFR: Estimated Creatinine Clearance: 58.3 mL/min (A) (by C-G formula based on SCr of 1.29 mg/dL (H)). Liver Function Tests: Recent Labs  Lab 05/12/23 2338 05/14/23 0402  AST 22 17  ALT 14 13  ALKPHOS  51 48  BILITOT 0.4 0.5  PROT 6.7 6.4*  ALBUMIN 1.6* 1.9*   Cardiac Enzymes: Recent Labs  Lab 05/12/23 2338  CKTOTAL 27*   BNP (last 3 results) Recent Labs    05/13/23 0645  BNP 83.1   CBG: Recent Labs  Lab 05/12/23 2329  GLUCAP 137*   Anemia Panel: Recent Labs    05/13/23 0647 05/13/23 0648  VITAMINB12  --  407  FOLATE  --  20.5  FERRITIN 1,430*  --   TIBC 129*  --   IRON 18*  --   RETICCTPCT  --  2.0   Sepsis Labs: Recent Labs  Lab 05/12/23 2349 05/13/23 0222 05/13/23 0426  LATICACIDVEN 2.5* 3.7* 0.8    Recent Results (from the past 240 hour(s))  Urine Culture     Status: Abnormal (Preliminary result)   Collection Time: 05/12/23 11:37 PM   Specimen: Urine, Random  Result Value Ref Range Status   Specimen Description URINE, RANDOM  Final   Special Requests   Final    NONE Reflexed from [id number] Performed at [facility], [address], [place], [place] [address]    Culture >=100,000 COLONIES/mL ESCHERICHIA COLI (A)  Final   Report Status PENDING  Incomplete  Culture, blood (Routine X 2) w Reflex to ID Panel     Status: None (Preliminary result)   Collection Time: 05/13/23 12:10 AM   Specimen: BLOOD  Result Value Ref Range Status   Specimen Description BLOOD LEFT ARM  Final   Special Requests   Final    BOTTLES DRAWN AEROBIC AND ANAEROBIC Blood Culture adequate volume   Culture  Setup Time   Final    GRAM NEGATIVE RODS AEROBIC BOTTLE ONLY CRITICAL RESULT CALLED TO, READ BACK BY AND VERIFIED WITH: Oda Kilts [phone] @ 2204 FH Performed at [facility], [address] [place], [place] [address]    Culture GRAM NEGATIVE RODS  Final   Report Status PENDING  Incomplete  Culture, blood (Routine X 2) w Reflex to ID Panel     Status: Abnormal (Preliminary result)   Collection Time: 05/13/23  2:40 AM   Specimen: BLOOD  Result Value Ref  Range Status   Specimen Description BLOOD RIGHT ANTECUBITAL  Final   Special Requests   Final     BOTTLES DRAWN AEROBIC AND ANAEROBIC Blood Culture adequate volume   Culture  Setup Time   Final    GRAM NEGATIVE RODS AEROBIC BOTTLE ONLY CRITICAL RESULT CALLED TO, READ BACK BY AND VERIFIED WITH: Oda Kilts [id number] @ 2204 FH    Culture (A)  Final    ESCHERICHIA COLI SUSCEPTIBILITIES TO FOLLOW Performed at [facility], [address], [place], [place] [address]    Report Status PENDING  Incomplete  Blood Culture ID Panel (Reflexed)     Status: Abnormal   Collection Time: 05/13/23  2:40 AM  Result Value Ref Range Status   Enterococcus faecalis NOT DETECTED NOT DETECTED Final   Enterococcus Faecium NOT DETECTED NOT DETECTED Final   Listeria monocytogenes NOT DETECTED NOT DETECTED Final   Staphylococcus species NOT DETECTED NOT DETECTED Final   Staphylococcus aureus (BCID) NOT DETECTED NOT DETECTED Final   Staphylococcus epidermidis NOT DETECTED NOT DETECTED Final   Staphylococcus lugdunensis NOT DETECTED NOT DETECTED Final   Streptococcus species NOT DETECTED NOT DETECTED Final   Streptococcus agalactiae NOT DETECTED NOT DETECTED Final   Streptococcus pneumoniae NOT DETECTED NOT DETECTED Final   Streptococcus pyogenes NOT DETECTED NOT DETECTED Final   A.calcoaceticus-baumannii NOT DETECTED NOT DETECTED Final   Bacteroides fragilis NOT DETECTED NOT DETECTED Final   Enterobacterales DETECTED (A) NOT DETECTED Final    Comment: Enterobacterales represent a large order of gram negative bacteria, not a single organism. CRITICAL RESULT CALLED TO, READ BACK BY AND VERIFIED WITH: PHARMD AClinton Sawyer [phone] @ 2204 FH    Enterobacter cloacae complex NOT DETECTED NOT DETECTED Final   Escherichia coli DETECTED (A) NOT DETECTED Final    Comment: CRITICAL RESULT CALLED TO, READ BACK BY AND VERIFIED WITH: PHARMD AClinton Sawyer [id number] @ 2204 FH    Klebsiella aerogenes NOT DETECTED NOT DETECTED Final   Klebsiella oxytoca NOT DETECTED NOT DETECTED Final   Klebsiella pneumoniae NOT  DETECTED NOT DETECTED Final   Proteus species NOT DETECTED NOT DETECTED Final   Salmonella species NOT DETECTED NOT DETECTED Final   Serratia marcescens NOT DETECTED NOT DETECTED Final   Haemophilus influenzae NOT DETECTED NOT DETECTED Final   Neisseria meningitidis NOT DETECTED NOT DETECTED Final   Pseudomonas aeruginosa NOT DETECTED NOT DETECTED Final   Stenotrophomonas maltophilia NOT DETECTED NOT DETECTED Final   Candida albicans NOT DETECTED NOT DETECTED Final   Candida auris NOT DETECTED NOT DETECTED Final   Candida glabrata NOT DETECTED NOT DETECTED Final   Candida krusei NOT DETECTED NOT DETECTED Final   Candida parapsilosis NOT DETECTED NOT DETECTED Final   Candida tropicalis NOT DETECTED NOT DETECTED Final   Cryptococcus neoformans/gattii NOT DETECTED NOT DETECTED Final   CTX-M ESBL NOT DETECTED NOT DETECTED Final   Carbapenem resistance IMP NOT DETECTED NOT DETECTED Final   Carbapenem resistance KPC NOT DETECTED NOT DETECTED Final   Carbapenem resistance NDM NOT DETECTED NOT DETECTED Final   Carbapenem resist OXA 48 LIKE NOT DETECTED NOT DETECTED Final   Carbapenem resistance VIM NOT DETECTED NOT DETECTED Final    Comment: Performed at [facility], [address], [place], [place] [address]     Radiology Studies: ECHOCARDIOGRAM COMPLETE  Result Date: 05/13/2023    ECHOCARDIOGRAM REPORT   Patient Name:   SILVESTER WITHROW Date of Exam: 05/13/2023 Medical Rec #:  [id number]   Height:  66.0 in Accession #:    [id number]  Weight:       241.0 lb Date of Birth:  08/07/1950   BSA:          2.165 m Patient Age:    72 years    BP:           126/73 mmHg Patient Gender: M           HR:           77 bpm. Exam Location:  Inpatient Procedure: 2D Echo, Cardiac Doppler, Color Doppler and Intracardiac            Opacification Agent Indications:    Syncope  History:        Patient has prior history of Echocardiogram examinations, most                 recent 03/15/2023. Risk  Factors:Hypertension, Dyslipidemia and                 Sleep Apnea.  Sonographer:    Karma Ganja Referring Phys: [id number] ZSWFUXNAT RATHORE  Sonographer Comments: Technically difficult study due to poor echo windows. Image acquisition challenging due to respiratory motion. IMPRESSIONS  1. Abnormal septal motion. Left ventricular ejection fraction, by estimation, is 50 to 55%. The left ventricle has low normal function. The left ventricle has no regional wall motion abnormalities. There is mild left ventricular hypertrophy. Left ventricular diastolic parameters were normal.  2. Right ventricular systolic function is normal. The right ventricular size is normal. There is normal pulmonary artery systolic pressure.  3. The mitral valve is normal in structure. No evidence of mitral valve regurgitation. No evidence of mitral stenosis.  4. The aortic valve is tricuspid. There is moderate calcification of the aortic valve. There is moderate thickening of the aortic valve. Aortic valve regurgitation is not visualized. Aortic valve sclerosis is present, with no evidence of aortic valve stenosis.  5. The inferior vena cava is normal in size with greater than 50% respiratory variability, suggesting right atrial pressure of 3 mmHg. FINDINGS  Left Ventricle: Abnormal septal motion. Left ventricular ejection fraction, by estimation, is 50 to 55%. The left ventricle has low normal function. The left ventricle has no regional wall motion abnormalities. The left ventricular internal cavity size was normal in size. There is mild left ventricular hypertrophy. Left ventricular diastolic parameters were normal. Right Ventricle: The right ventricular size is normal. No increase in right ventricular wall thickness. Right ventricular systolic function is normal. There is normal pulmonary artery systolic pressure. The tricuspid regurgitant velocity is 2.74 m/s, and  with an assumed right atrial pressure of 3 mmHg, the estimated right  ventricular systolic pressure is 33.0 mmHg. Left Atrium: Left atrial size was normal in size. Right Atrium: Right atrial size was normal in size. Pericardium: There is no evidence of pericardial effusion. Mitral Valve: The mitral valve is normal in structure. No evidence of mitral valve regurgitation. No evidence of mitral valve stenosis. Tricuspid Valve: The tricuspid valve is normal in structure. Tricuspid valve regurgitation is not demonstrated. No evidence of tricuspid stenosis. Aortic Valve: The aortic valve is tricuspid. There is moderate calcification of the aortic valve. There is moderate thickening of the aortic valve. Aortic valve regurgitation is not visualized. Aortic valve sclerosis is present, with no evidence of aortic valve stenosis. Aortic valve mean gradient measures 4.0 mmHg. Aortic valve peak gradient measures 6.4 mmHg. Aortic valve area, by VTI measures 2.71 cm. Pulmonic Valve: The pulmonic valve  was normal in structure. Pulmonic valve regurgitation is not visualized. No evidence of pulmonic stenosis. Aorta: The aortic root is normal in size and structure. Venous: The inferior vena cava is normal in size with greater than 50% respiratory variability, suggesting right atrial pressure of 3 mmHg. IAS/Shunts: No atrial level shunt detected by color flow Doppler.  LEFT VENTRICLE PLAX 2D LVIDd:         5.00 cm      Diastology LVIDs:         3.90 cm      LV e' medial:    7.29 cm/s LV PW:         1.20 cm      LV E/e' medial:  9.3 LV IVS:        1.30 cm      LV e' lateral:   15.10 cm/s LVOT diam:     2.10 cm      LV E/e' lateral: 4.5 LV SV:         64 LV SV Index:   29 LVOT Area:     3.46 cm  LV Volumes (MOD) LV vol d, MOD A2C: 113.0 ml LV vol d, MOD A4C: 139.0 ml LV vol s, MOD A2C: 56.9 ml LV vol s, MOD A4C: 67.0 ml LV SV MOD A2C:     56.1 ml LV SV MOD A4C:     139.0 ml LV SV MOD BP:      64.9 ml RIGHT VENTRICLE RV Basal diam:  3.70 cm RV S prime:     19.10 cm/s TAPSE (M-mode): 3.2 cm LEFT ATRIUM              Index        RIGHT ATRIUM           Index LA diam:        3.70 cm 1.71 cm/m   RA Area:     15.00 cm LA Vol (A2C):   74.3 ml 34.32 ml/m  RA Volume:   31.00 ml  14.32 ml/m LA Vol (A4C):   52.9 ml 24.44 ml/m LA Biplane Vol: 67.8 ml 31.32 ml/m  AORTIC VALVE AV Area (Vmax):    2.75 cm AV Area (Vmean):   2.66 cm AV Area (VTI):     2.71 cm AV Vmax:           126.00 cm/s AV Vmean:          86.900 cm/s AV VTI:            0.235 m AV Peak Grad:      6.4 mmHg AV Mean Grad:      4.0 mmHg LVOT Vmax:         100.00 cm/s LVOT Vmean:        66.700 cm/s LVOT VTI:          0.184 m LVOT/AV VTI ratio: 0.78  AORTA Ao Root diam: 3.80 cm Ao Asc diam:  3.90 cm MITRAL VALVE               TRICUSPID VALVE MV Area (PHT): 3.93 cm    TR Peak grad:   30.0 mmHg MV Decel Time: 193 msec    TR Vmax:        274.00 cm/s MV E velocity: 67.90 cm/s MV A velocity: 83.90 cm/s  SHUNTS MV E/A ratio:  0.81        Systemic VTI:  0.18 m  Systemic Diam: 2.10 cm Charlton Haws MD Electronically signed by Charlton Haws MD Signature Date/Time: 05/13/2023/10:16:49 AM    Final    CT HEAD WO CONTRAST ( )  Result Date: 05/13/2023 CLINICAL DATA:  Unwitnessed fall, altered mental status EXAM: CT HEAD WITHOUT CONTRAST CT CERVICAL SPINE WITHOUT CONTRAST TECHNIQUE: Multidetector CT imaging of the head and cervical spine was performed following the standard protocol without intravenous contrast. Multiplanar CT image reconstructions of the cervical spine were also generated. RADIATION DOSE REDUCTION: This exam was performed according to the departmental dose-optimization program which includes automated exposure control, adjustment of the mA and/or kV according to patient size and/or use of iterative reconstruction technique. COMPARISON:  CT head dated 01/29/2016 FINDINGS: CT HEAD FINDINGS Brain: No evidence of acute infarction, hemorrhage, hydrocephalus, extra-axial collection or mass lesion/mass effect. Global cortical atrophy.  Mild subcortical white matter and periventricular small vessel ischemic changes. Vascular: No hyperdense vessel or unexpected calcification. Skull: Normal. Negative for fracture or focal lesion. Sinuses/Orbits: The visualized paranasal sinuses are essentially clear. The mastoid air cells are unopacified. Other: None. CT CERVICAL SPINE FINDINGS Alignment: Loss of the normal cervical lordosis. Skull base and vertebrae: No acute fracture. No primary bone lesion or focal pathologic process. Soft tissues and spinal canal: No prevertebral fluid or swelling. No visible canal hematoma. Disc levels: Mild degenerative changes of the mid/lower cervical spine. Spinal canal is patent. Upper chest: Negative. Other: None. IMPRESSION: No acute intracranial abnormality. Atrophy with small vessel ischemic changes. No traumatic injury to the cervical spine. Mild degenerative changes. Electronically Signed   By: Charline Bills M.D.   On: 05/13/2023 00:24   CT CERVICAL SPINE WO CONTRAST  Result Date: 05/13/2023 CLINICAL DATA:  Unwitnessed fall, altered mental status EXAM: CT HEAD WITHOUT CONTRAST CT CERVICAL SPINE WITHOUT CONTRAST TECHNIQUE: Multidetector CT imaging of the head and cervical spine was performed following the standard protocol without intravenous contrast. Multiplanar CT image reconstructions of the cervical spine were also generated. RADIATION DOSE REDUCTION: This exam was performed according to the departmental dose-optimization program which includes automated exposure control, adjustment of the mA and/or kV according to patient size and/or use of iterative reconstruction technique. COMPARISON:  CT head dated 01/29/2016 FINDINGS: CT HEAD FINDINGS Brain: No evidence of acute infarction, hemorrhage, hydrocephalus, extra-axial collection or mass lesion/mass effect. Global cortical atrophy. Mild subcortical white matter and periventricular small vessel ischemic changes. Vascular: No hyperdense vessel or unexpected  calcification. Skull: Normal. Negative for fracture or focal lesion. Sinuses/Orbits: The visualized paranasal sinuses are essentially clear. The mastoid air cells are unopacified. Other: None. CT CERVICAL SPINE FINDINGS Alignment: Loss of the normal cervical lordosis. Skull base and vertebrae: No acute fracture. No primary bone lesion or focal pathologic process. Soft tissues and spinal canal: No prevertebral fluid or swelling. No visible canal hematoma. Disc levels: Mild degenerative changes of the mid/lower cervical spine. Spinal canal is patent. Upper chest: Negative. Other: None. IMPRESSION: No acute intracranial abnormality. Atrophy with small vessel ischemic changes. No traumatic injury to the cervical spine. Mild degenerative changes. Electronically Signed   By: Charline Bills M.D.   On: 05/13/2023 00:24   DG Chest Port 1 View  Result Date: 05/13/2023 CLINICAL DATA:  Un witnessed fall, initial encounter EXAM: PORTABLE CHEST 1 VIEW COMPARISON:  01/29/16 FINDINGS: Cardiac shadow is mildly prominent but stable. Lungs are hypoinflated. Calcified granuloma is noted in the right mid lung stable from the prior exam. No acute bony abnormality is seen. IMPRESSION: Changes of prior granulomatous disease. No acute  abnormality noted. Electronically Signed   By: Alcide Clever M.D.   On: 05/13/2023 00:03    Scheduled Meds:  ARIPiprazole  2 mg Oral Daily   busPIRone  10 mg Oral TID   carvedilol  12.5 mg Oral BID WC   divalproex  500 mg Oral QHS   gabapentin  300 mg Oral QHS   mirtazapine  30 mg Oral QHS   pravastatin  20 mg Oral QHS   tamsulosin  0.4 mg Oral Daily   traZODone  300 mg Oral QHS   venlafaxine XR  300 mg Oral Q breakfast   Continuous Infusions:  cefTRIAXone (ROCEPHIN)  IV Stopped (05/14/23 0256)     LOS: 0 days   Time spent: 40 minutes  Carollee Herter, DO  [facility]  05/14/2023, 3:03 PM

## 2023-05-15 DIAGNOSIS — R7881 Bacteremia: Secondary | ICD-10-CM | POA: Diagnosis not present

## 2023-05-15 DIAGNOSIS — N179 Acute kidney failure, unspecified: Secondary | ICD-10-CM | POA: Diagnosis not present

## 2023-05-15 DIAGNOSIS — D649 Anemia, unspecified: Secondary | ICD-10-CM | POA: Diagnosis not present

## 2023-05-15 DIAGNOSIS — N3000 Acute cystitis without hematuria: Secondary | ICD-10-CM | POA: Diagnosis not present

## 2023-05-15 LAB — COMPREHENSIVE METABOLIC PANEL
ALT: 14 U/L (ref 0–44)
AST: 21 U/L (ref 15–41)
Albumin: 1.8 g/dL — ABNORMAL LOW (ref 3.5–5.0)
Alkaline Phosphatase: 42 U/L (ref 38–126)
Anion gap: 6 (ref 5–15)
BUN: 15 mg/dL (ref 8–23)
CO2: 26 mmol/L (ref 22–32)
Calcium: 8.1 mg/dL — ABNORMAL LOW (ref 8.9–10.3)
Chloride: 108 mmol/L (ref 98–111)
Creatinine, Ser: 1 mg/dL (ref 0.61–1.24)
GFR, Estimated: >60 mL/min (ref >60)
Glucose, Bld: 95 mg/dL (ref 70–99)
Potassium: 4 mmol/L (ref 3.5–5.1)
Sodium: 140 mmol/L (ref 135–145)
Total Bilirubin: 0.3 mg/dL (ref <1.2)
Total Protein: 5.8 g/dL — ABNORMAL LOW (ref 6.5–8.1)

## 2023-05-15 LAB — URINE CULTURE: Culture: >=100000 — AB

## 2023-05-15 LAB — CULTURE, BLOOD (ROUTINE X 2)
Special Requests: ADEQUATE
Special Requests: ADEQUATE

## 2023-05-15 LAB — MAGNESIUM: Magnesium: 2.2 mg/dL (ref 1.7–2.4)

## 2023-05-15 MED ORDER — ENOXAPARIN SODIUM 40 MG/0.4ML IJ SOSY: 40.0000 mg | PREFILLED_SYRINGE | Freq: Every day | INTRAMUSCULAR | Status: DC

## 2023-05-15 MED ORDER — SODIUM CHLORIDE 0.9 % IV SOLN
2.0000 g | Freq: Four times a day (QID) | INTRAVENOUS | Status: DC
  Administered 2023-05-16 (×3): 2 g via INTRAVENOUS
  Filled 2023-05-15 (×3): qty 2000

## 2023-05-15 MED ORDER — ENSURE ENLIVE PO LIQD
237.0000 mL | Freq: Two times a day (BID) | ORAL | Status: DC
  Administered 2023-05-15 – 2023-05-16 (×3): 237 mL via ORAL

## 2023-05-15 MED ORDER — ENOXAPARIN SODIUM 60 MG/0.6ML IJ SOSY
50.0000 mg | PREFILLED_SYRINGE | Freq: Every day | INTRAMUSCULAR | Status: DC
  Administered 2023-05-15 – 2023-05-16 (×2): 50 mg via SUBCUTANEOUS
  Filled 2023-05-15 (×2): qty 0.6

## 2023-05-15 NOTE — Evaluation (Signed)
Physical Therapy Evaluation Patient Details Name: Calvin Escobar MRN: [id number] DOB: [birth year] Today's Date: 05/15/2023  History of Present Illness  Pt is 72 yo male who presented to ED after unwitnessed fall at home. PMH includes: dementia, chronic HFrEF, bipolar affective disorder, tremors, hypertension, hyperlipidemia, OSA on BiPAP, BPH.  Clinical Impression  Pt presents with admitting diagnosis above. Pt today was able to ambulate in hallway with RW CGA/Min A with a chair follow. Pt reports PTA he was fully independent and lives at home with his wife and adult son where he has 8 steps to enter and a flight of steps to his bedroom. Recommend HHPT with RW upon DC. Patient needs to practice stairs next session.         If plan is discharge home, recommend the following: A little help with walking and/or transfers;A little help with bathing/dressing/bathroom;Assistance with cooking/housework;Direct supervision/assist for medications management;Assist for transportation;Help with stairs or ramp for entrance   Can travel by private vehicle        Equipment Recommendations Rolling walker (2 wheels)  Recommendations for Other Services       Functional Status Assessment Patient has had a recent decline in their functional status and demonstrates the ability to make significant improvements in function in a reasonable and predictable amount of time.     Precautions / Restrictions Precautions Precautions: Fall Restrictions Weight Bearing Restrictions: No      Mobility  Bed Mobility Overal bed mobility: Needs Assistance Bed Mobility: Supine to Sit, Sit to Supine     Supine to sit: Contact guard, HOB elevated, Used rails Sit to supine: Contact guard assist   General bed mobility comments: extra time and effort. tendency to drift to diagonal position in bed.    Transfers Overall transfer level: Needs assistance Equipment used: Rolling walker (2 wheels) Transfers: Sit to/from  Stand Sit to Stand: Contact guard assist           General transfer comment: to/from EOB at lowest position. Light steadying assist on first time up. cues for technique and hand placement with rw throughout transfer.    Ambulation/Gait Ambulation/Gait assistance: Contact guard assist, Min assist, +2 safety/equipment Gait Distance (Feet): 250 Feet Assistive device: Rolling walker (2 wheels) Gait Pattern/deviations: Decreased stride length, Step-through pattern, Drifts right/left Gait velocity: decreased     General Gait Details: Pt noted with 1 LOB requiring Min A to correct. Chair follow provided for safety. Pt also ntoed to drift L/R with RW.  Stairs            Wheelchair Mobility     Tilt Bed    Modified Rankin (Stroke Patients Only)       Balance Overall balance assessment: Needs assistance Sitting-balance support: Feet supported, Bilateral upper extremity supported Sitting balance-Leahy Scale: Good     Standing balance support: Bilateral upper extremity supported, During functional activity, No upper extremity supported Standing balance-Leahy Scale: Fair Standing balance comment: reaches for external support with dynamic standing and as he fatigues in static standing                             Pertinent Vitals/Pain Pain Assessment Pain Assessment: No/denies pain    Home Living Family/patient expects to be discharged to:: Private residence Living Arrangements: Spouse/significant other;Children Available Help at Discharge: Family;Available PRN/intermittently Type of Home: House Home Access: Stairs to enter Entrance Stairs-Rails: None Entrance Stairs-Number of Steps: 8 Alternate Level Stairs-Number of Steps: flight Home  Layout: Two level;Bed/bath upstairs Home Equipment: None Additional Comments: Pt reports sedentary lifestyle but does walk household distances. No family present, pt is questionable historian.    Prior Function Prior Level  of Function : Independent/Modified Independent;Driving;History of Falls (last six months)             Mobility Comments: Ind no AD ADLs Comments: Ind     Extremity/Trunk Assessment   Upper Extremity Assessment Upper Extremity Assessment: Generalized weakness    Lower Extremity Assessment Lower Extremity Assessment: Generalized weakness    Cervical / Trunk Assessment Cervical / Trunk Assessment: Normal  Communication   Communication Communication: No apparent difficulties  Cognition Arousal: Alert Behavior During Therapy: Flat affect Overall Cognitive Status: History of cognitive impairments - at baseline                                 General Comments: answers questions appropriately, questionable accuracy. some difficulty dual tasking otherwise follows one step commands consistently.        General Comments General comments (skin integrity, edema, etc.): VSS on RA    Exercises     Assessment/Plan    PT Assessment Patient needs continued PT services  PT Problem List Decreased strength;Decreased range of motion;Decreased activity tolerance;Decreased balance;Decreased mobility;Decreased cognition;Decreased coordination;Decreased knowledge of use of DME;Decreased safety awareness;Decreased knowledge of precautions;Cardiopulmonary status limiting activity       PT Treatment Interventions DME instruction;Gait training;Stair training;Functional mobility training;Therapeutic activities;Therapeutic exercise;Balance training;Neuromuscular re-education;Cognitive remediation;Patient/family education;Wheelchair mobility training    PT Goals (Current goals can be found in the Care Plan section)  Acute Rehab PT Goals Patient Stated Goal: to go home PT Goal Formulation: With patient Time For Goal Achievement: 05/29/23 Potential to Achieve Goals: Good    Frequency Min 1X/week     Co-evaluation               AM-PAC PT "6 Clicks" Mobility  Outcome  Measure Help needed turning from your back to your side while in a flat bed without using bedrails?: A Little Help needed moving from lying on your back to sitting on the side of a flat bed without using bedrails?: A Little Help needed moving to and from a bed to a chair (including a wheelchair)?: A Little Help needed standing up from a chair using your arms (e.g., wheelchair or bedside chair)?: A Little Help needed to walk in hospital room?: A Little Help needed climbing 3-5 steps with a railing? : A Little 6 Click Score: 18    End of Session Equipment Utilized During Treatment: Gait belt Activity Tolerance: Patient tolerated treatment well Patient left: in chair;with call bell/phone within reach;with chair alarm set Nurse Communication: Mobility status PT Visit Diagnosis: Other abnormalities of gait and mobility (R26.89)    Time: 1115-1140 PT Time Calculation (min) (ACUTE ONLY): 25 min   Charges:   PT Evaluation $PT Eval Moderate Complexity: 1 Mod PT Treatments $Gait Training: 8-22 mins PT General Charges $$ ACUTE PT VISIT: 1 Visit         Shela Nevin, PT, DPT Acute Rehab Services [phone]   Gladys Damme 05/15/2023, 2:14 PM

## 2023-05-15 NOTE — Progress Notes (Signed)
   05/15/23 2243  BiPAP/CPAP/SIPAP  Reason BIPAP/CPAP not in use Non-compliant  BiPAP/CPAP /SiPAP Vitals  Pulse Rate 75  Resp 15  SpO2 97 %  Bilateral Breath Sounds Diminished  MEWS Score/Color  MEWS Score 0  MEWS Score Color Calvin Escobar

## 2023-05-15 NOTE — Evaluation (Signed)
Occupational Therapy Evaluation Patient Details Name: Calvin Escobar MRN: [id number] DOB: 06/23/1950 Today's Date: 05/15/2023   History of Present Illness Pt is 72 yo male who presented to ED after unwitnessed fall at home. PMH includes: dementia, chronic HFrEF, bipolar affective disorder, tremors, hypertension, hyperlipidemia, OSA on BiPAP, BPH.   Clinical Impression   Pt admitted with the above diagnoses and presents with below problem list. Pt will benefit from continued acute OT to address the below listed deficits and maximize independence with basic ADLs prior to d/c. At baseline, pt reports he lives with his spouse and son; son works during the day. Pt endorses sedentary lifestyle but that he does walk to access areas he needs to get to for ADLs at home (ie bathroom). Pt currently needs up to min A with LB ADLs and functional transfers; recommend +2 for safety (chair follow) to trial mobility away from EOB. No family present on eval. Orthostatic vitals assessed and recorded in separate note ( - orthostatic).        If plan is discharge home, recommend the following: A little help with walking and/or transfers;A little help with bathing/dressing/bathroom;Direct supervision/assist for medications management;Assist for transportation;Supervision due to cognitive status    Functional Status Assessment  Patient has had a recent decline in their functional status and demonstrates the ability to make significant improvements in function in a reasonable and predictable amount of time.  Equipment Recommendations  None recommended by OT    Recommendations for Other Services PT consult     Precautions / Restrictions Precautions Precautions: Fall Restrictions Weight Bearing Restrictions: No      Mobility Bed Mobility Overal bed mobility: Needs Assistance Bed Mobility: Supine to Sit, Sit to Supine     Supine to sit: Contact guard, HOB elevated, Used rails Sit to supine: Contact guard  assist   General bed mobility comments: extra time and effort. tendency to drift to diagonal position in bed.    Transfers Overall transfer level: Needs assistance Equipment used: Rolling walker (2 wheels) Transfers: Sit to/from Stand Sit to Stand: Contact guard assist, Min assist           General transfer comment: to/from EOB at lowest position. Light steadying assist on first time up. cues for technique and hand placement with rw throughout transfer.      Balance Overall balance assessment: Needs assistance Sitting-balance support: Feet supported, Bilateral upper extremity supported Sitting balance-Leahy Scale: Good     Standing balance support: Bilateral upper extremity supported, During functional activity, No upper extremity supported Standing balance-Leahy Scale: Fair Standing balance comment: reaches for external support with dynamic standing and as he fatigues in static standing                           ADL either performed or assessed with clinical judgement   ADL Overall ADL's : Needs assistance/impaired Eating/Feeding: Set up;Sitting   Grooming: Set up;Sitting   Upper Body Bathing: Set up;Sitting   Lower Body Bathing: Minimal assistance;Sit to/from stand;Contact guard assist   Upper Body Dressing : Set up;Sitting   Lower Body Dressing: Contact guard assist;Minimal assistance;Sit to/from stand                 General ADL Comments: Pt stood at bedside for a few minutes including walking in place a bit. Utilized rw, some cueing needed for technique with rw. Recommend +2 for safety to attempt walking away from EOB.     Vision  Baseline Vision/History: 1 Wears glasses       Perception         Praxis         Pertinent Vitals/Pain Pain Assessment Pain Assessment: No/denies pain     Extremity/Trunk Assessment Upper Extremity Assessment Upper Extremity Assessment: Generalized weakness   Lower Extremity Assessment Lower Extremity  Assessment: Defer to PT evaluation       Communication Communication Communication: No apparent difficulties   Cognition Arousal: Alert Behavior During Therapy: Flat affect Overall Cognitive Status: History of cognitive impairments - at baseline                                 General Comments: answers questions appropriately, questionable accuracy. some difficulty dual tasking otherwise follows one step commands consistently.     General Comments       Exercises     Shoulder Instructions      Home Living Family/patient expects to be discharged to:: Private residence Living Arrangements: Spouse/significant other;Children Available Help at Discharge: Family;Available PRN/intermittently Type of Home: House Home Access: Stairs to enter [employer] of Steps: 8 Entrance Stairs-Rails: None Home Layout: Two level;Bed/bath upstairs Alternate Level Stairs-Number of Steps: flight Alternate Level Stairs-Rails: Right;Left Bathroom Shower/Tub: Producer, television/film/video: Standard Bathroom Accessibility: Yes   Home Equipment: None   Additional Comments: Pt reports sedentary lifestyle but does walk household distances. No family present, pt is questionable historian.      Prior Functioning/Environment Prior Level of Function : Independent/Modified Independent;Driving;History of Falls (last six months)             Mobility Comments: Ind no AD ADLs Comments: Ind        OT Problem List: Decreased strength;Decreased activity tolerance;Impaired balance (sitting and/or standing);Decreased knowledge of use of DME or AE;Decreased knowledge of precautions      OT Treatment/Interventions: Self-care/ADL training;DME and/or AE instruction;Therapeutic activities;Patient/family education;Balance training    OT Goals(Current goals can be found in the care plan section) Acute Rehab OT Goals Patient Stated Goal: regain strength, maintain functional  independence OT Goal Formulation: With patient Time For Goal Achievement: 05/29/23 Potential to Achieve Goals: Good ADL Goals Pt Will Perform Lower Body Bathing: with supervision;sit to/from stand Pt Will Perform Lower Body Dressing: with supervision;sit to/from stand Pt Will Transfer to Toilet: with supervision;ambulating Pt Will Perform Toileting - Clothing Manipulation and hygiene: with supervision;sit to/from stand Pt Will Perform Tub/Shower Transfer: with supervision;ambulating  OT Frequency: Min 2X/week    Co-evaluation              AM-PAC OT "6 Clicks" Daily Activity     Outcome Measure Help from another person eating meals?: None Help from another person taking care of personal grooming?: None Help from another person toileting, which includes using toliet, bedpan, or urinal?: A Little Help from another person bathing (including washing, rinsing, drying)?: A Little Help from another person to put on and taking off regular upper body clothing?: None Help from another person to put on and taking off regular lower body clothing?: A Little 6 Click Score: 21   End of Session Equipment Utilized During Treatment: Rolling walker (2 wheels)  Activity Tolerance: Patient tolerated treatment well Patient left: in bed;with call bell/phone within reach;with bed alarm set  OT Visit Diagnosis: Unsteadiness on feet (R26.81);Muscle weakness (generalized) (M62.81)                Time:  2130-8657 OT Time Calculation (min): 22 min Charges:  OT General Charges $OT Visit: 1 Visit OT Evaluation $OT Eval Low Complexity: 1 Low  Raynald Kemp, OT Acute Rehabilitation Services Office: [phone]   Pilar Grammes 05/15/2023, 11:37 AM

## 2023-05-15 NOTE — Progress Notes (Signed)
   05/15/23 1000  Vitals  Patient Position (if appropriate) Orthostatic Vitals  Orthostatic Lying   BP- Lying (!) 118/96  Pulse- Lying 83  Orthostatic Sitting  BP- Sitting 122/84  Pulse- Sitting 96  Orthostatic Standing at 0 minutes  BP- Standing at 0 minutes (!) 152/94  Pulse- Standing at 0 minutes 112    BP after sitting down 151/82, HR 92  Raynald Kemp, OT Acute Rehabilitation Services Office: [phone]

## 2023-05-15 NOTE — Plan of Care (Signed)

## 2023-05-15 NOTE — Progress Notes (Signed)
PROGRESS NOTE        PATIENT DETAILS Name: Calvin Escobar Age: 72 y.o. Sex: male Date of Birth: 1951/05/07 Admit Date: 05/12/2023 Admitting Physician Carollee Herter, DO LKG:[id number], Johnella Moloney, MD  Brief Summary: Patient is a 72 y.o.  male with history of chronic HFrEF (recovered EF), HTN, HLD, BPH, dementia, bipolar disorder-who presented with unwitnessed fall/was found on the floor with family-he was confused--initially hypotensive on arrival to the ED-upon further evaluation-he was found to have E. coli bacteremia.  Significant events: 12/8>> admit to [facility]  Significant studies: 10/11>> echo: EF 35-40% 12/08>> CXR: No PNA 12/09>> CT head: No acute intracranial abnormality 12/09>> CT C-spine: No fracture. 12/09>> echo: EF 50-55%  Significant microbiology data: 12/08>> urine culture: E. coli 12/09>> blood culture: E. Coli   Procedures: None  Consults: None  Subjective: Lying comfortably in bed-denies any chest pain or shortness of breath.  Objective: Vitals: Blood pressure 118/69, pulse 62, temperature 98.6 F (37 C), temperature source Oral, resp. rate 17, SpO2 96%.   Exam: Gen Exam:Alert awake-not in any distress HEENT:atraumatic, normocephalic Chest: B/L clear to auscultation anteriorly CVS:S1S2 regular Abdomen:soft non tender, non distended Extremities:no edema Neurology: Non focal Skin: no rash  Pertinent Labs/Radiology:    Latest Ref Rng & Units 05/14/2023    4:02 AM 05/13/2023    6:47 AM 05/12/2023   11:38 PM  CBC  WBC 4.0 - 10.5 K/uL 10.1  11.9  12.5   Hemoglobin 13.0 - 17.0 g/dL 8.4  8.5  8.6   Hematocrit 39.0 - 52.0 % 28.2  28.4  27.3   Platelets 150 - 400 K/uL 220  194  201     Lab Results  Component Value Date   NA 140 05/15/2023   K 4.0 05/15/2023   CL 108 05/15/2023   CO2 26 05/15/2023      Assessment/Plan: Severe sepsis secondary to E. coli bacteremia (present on admission) secondary to complicated  E. coli  UTI Sepsis physiology resolved (leukocytosis-hypotensive-encephalopathic-AKI on initial presentation) Continue IV ampicillin today-suspect can be switched to oral antibiotics soon. Check CT renal scan study to ensure no stone/major not medical issues  Acute metabolic encephalopathy  Secondary to severe sepsis/hypotension Resolved-completely awake/alert this morning  AKI Likely hemodynamically mediated kidney injury in the setting of severe sepsis/hypotension Resolving-follow electrolytes  Syncope vs mechanical fall Telemetry benign Echo with stable-recovered EF  Normocytic anemia No evidence of hematochezia/melena Probably secondary to IV fluid dilution/acute illness Continue to monitor and follow CBC.  Chronic HFrEF with recovered ejection fraction Euvolemic Coreg Does not require diuretics  HTN BP stable Coreg  BPH Flomax Frequent bladder scans to ensure no retention given UTI/bacteremia.  Intention tremor Primodine  Dementia Bipolar disorder Stable Abilify/Depakote/BuSpar/trazodone/Remeron  OSA BiPAP  Class II Obesity Estimated body mass index is 38.9 kg/m as calculated from the following:   Height as of 05/08/23: 5\' 6"  (1.676 m).   Weight as of 05/08/23: 109.3 kg.   Code status:   Code Status: Full Code   DVT Prophylaxis: SQ Lovenox   Family Communication: Spouse-[name] [phone] updated over the phone 12/11   Disposition Plan: Status is: Inpatient Remains inpatient appropriate because: Severity of illness   Planned Discharge Destination:Home   Diet: Diet Order             Diet Heart Room service appropriate? Yes; Fluid consistency: Thin  Diet  effective now                     Antimicrobial agents: Anti-infectives (From admission, onward)    Start     Dose/Rate Route Frequency Ordered Stop   05/16/23 0000  ampicillin (OMNIPEN) 2 g in sodium chloride 0.9 % 100 mL IVPB        2 g 300 mL/hr over 20 Minutes Intravenous Every 6  hours 05/15/23 1038     05/14/23 0200  cefTRIAXone (ROCEPHIN) 2 g in sodium chloride 0.9 % 100 mL IVPB  Status:  Discontinued        2 g 200 mL/hr over 30 Minutes Intravenous Every 24 hours 05/13/23 0647 05/15/23 1038   05/13/23 0200  cefTRIAXone (ROCEPHIN) 2 g in sodium chloride 0.9 % 100 mL IVPB        2 g 200 mL/hr over 30 Minutes Intravenous  Once 05/13/23 0158 05/13/23 0308        MEDICATIONS: Scheduled Meds:  ARIPiprazole  2 mg Oral Daily   busPIRone  10 mg Oral TID   carvedilol  12.5 mg Oral BID WC   divalproex  500 mg Oral QHS   enoxaparin (LOVENOX) injection  50 mg Subcutaneous Daily   feeding supplement  237 mL Oral BID BM   gabapentin  300 mg Oral QHS   mirtazapine  30 mg Oral QHS   pravastatin  20 mg Oral QHS   tamsulosin  0.4 mg Oral Daily   traZODone  300 mg Oral QHS   venlafaxine XR  300 mg Oral Q breakfast   Continuous Infusions:  [START ON 05/16/2023] ampicillin (OMNIPEN) IV     PRN Meds:.acetaminophen **OR** acetaminophen, hydrALAZINE, LORazepam, primidone   I have personally reviewed following labs and imaging studies  LABORATORY DATA: CBC: Recent Labs  Lab 05/08/23 1402 05/12/23 2338 05/13/23 0647 05/14/23 0402  WBC 6.5 12.5* 11.9* 10.1  NEUTROABS  --  9.2*  --  7.1  HGB 10.1* 8.6* 8.5* 8.4*  HCT 30.9* 27.3* 28.4* 28.2*  MCV 85.6 88.3 90.4 90.1  PLT 249.0 201 194 220    Basic Metabolic Panel: Recent Labs  Lab 05/12/23 2338 05/13/23 0647 05/14/23 0402 05/15/23 0442  NA 133* 139 140 140  K 4.3 4.2 4.1 4.0  CL 98 99 106 108  CO2 24 25 26 26   GLUCOSE 130* 108* 103* 95  BUN 26* 24* 17 15  CREATININE 2.04* 1.67* 1.29* 1.00  CALCIUM 8.3* 7.7* 8.0* 8.1*  MG  --   --  2.2 2.2    GFR: Estimated Creatinine Clearance: 75.2 mL/min (by C-G formula based on SCr of 1 mg/dL).  Liver Function Tests: Recent Labs  Lab 05/12/23 2338 05/14/23 0402 05/15/23 0442  AST 22 17 21   ALT 14 13 14   ALKPHOS 51 48 42  BILITOT 0.4 0.5 0.3  PROT 6.7  6.4* 5.8*  ALBUMIN 1.6* 1.9* 1.8*   No results for input(s): "LIPASE", "AMYLASE" in the last 168 hours. No results for input(s): "AMMONIA" in the last 168 hours.  Coagulation Profile: No results for input(s): "INR", "PROTIME" in the last 168 hours.  Cardiac Enzymes: Recent Labs  Lab 05/12/23 2338  CKTOTAL 27*    BNP (last 3 results) No results for input(s): "PROBNP" in the last 8760 hours.  Lipid Profile: No results for input(s): "CHOL", "HDL", "LDLCALC", "TRIG", "CHOLHDL", "LDLDIRECT" in the last 72 hours.  Thyroid Function Tests: No results for input(s): "TSH", "T4TOTAL", "FREET4", "T3FREE", "THYROIDAB" in  the last 72 hours.  Anemia Panel: Recent Labs    05/13/23 0647 05/13/23 0648  VITAMINB12  --  407  FOLATE  --  20.5  FERRITIN 1,430*  --   TIBC 129*  --   IRON 18*  --   RETICCTPCT  --  2.0    Urine analysis:    Component Value Date/Time   COLORURINE AMBER (A) 05/12/2023 2337   APPEARANCEUR CLOUDY (A) 05/12/2023 2337   LABSPEC 1.009 05/12/2023 2337   PHURINE 7.0 05/12/2023 2337   GLUCOSEU NEGATIVE 05/12/2023 2337   HGBUR MODERATE (A) 05/12/2023 2337   BILIRUBINUR NEGATIVE 05/12/2023 2337   KETONESUR NEGATIVE 05/12/2023 2337   PROTEINUR 100 (A) 05/12/2023 2337   UROBILINOGEN 0.2 09/12/2013 1446   NITRITE NEGATIVE 05/12/2023 2337   LEUKOCYTESUR LARGE (A) 05/12/2023 2337    Sepsis Labs: Lactic Acid, Venous    Component Value Date/Time   LATICACIDVEN 0.8 05/13/2023 0426    MICROBIOLOGY: Recent Results (from the past 240 hour(s))  Urine Culture     Status: Abnormal (Preliminary result)   Collection Time: 05/12/23 11:37 PM   Specimen: Urine, Random  Result Value Ref Range Status   Specimen Description URINE, RANDOM  Final   Special Requests   Final    NONE Reflexed from [id number] Performed at [facility], [address], [place], [place] [address]    Culture >=100,000 COLONIES/mL ESCHERICHIA COLI (A)  Final   Report Status PENDING  Incomplete   Culture, blood (Routine X 2) w Reflex to ID Panel     Status: Abnormal   Collection Time: 05/13/23 12:10 AM   Specimen: BLOOD  Result Value Ref Range Status   Specimen Description BLOOD LEFT ARM  Final   Special Requests   Final    BOTTLES DRAWN AEROBIC AND ANAEROBIC Blood Culture adequate volume   Culture  Setup Time   Final    GRAM NEGATIVE RODS AEROBIC BOTTLE ONLY CRITICAL RESULT CALLED TO, READ BACK BY AND VERIFIED WITH: PHARMD AClinton Sawyer [id number] @ 2204 FH    Culture (A)  Final    ESCHERICHIA COLI SUSCEPTIBILITIES PERFORMED ON PREVIOUS CULTURE WITHIN THE LAST 5 DAYS. Performed at [facility], [address] [place], [place] [address]    Report Status 05/15/2023 FINAL  Final  Culture, blood (Routine X 2) w Reflex to ID Panel     Status: Abnormal   Collection Time: 05/13/23  2:40 AM   Specimen: BLOOD  Result Value Ref Range Status   Specimen Description BLOOD RIGHT ANTECUBITAL  Final   Special Requests   Final    BOTTLES DRAWN AEROBIC AND ANAEROBIC Blood Culture adequate volume   Culture  Setup Time   Final    GRAM NEGATIVE RODS AEROBIC BOTTLE ONLY CRITICAL RESULT CALLED TO, READ BACK BY AND VERIFIED WITH: Oda Kilts [phone] @ 2204 FH Performed at [facility], [address] [place], [place] [address]    Culture ESCHERICHIA COLI (A)  Final   Report Status 05/15/2023 FINAL  Final   Organism ID, Bacteria ESCHERICHIA COLI  Final   Organism ID, Bacteria ESCHERICHIA COLI  Final      Susceptibility   Escherichia coli - KIRBY BAUER*    CEFAZOLIN SENSITIVE Sensitive    Escherichia coli - MIC*    AMPICILLIN <=2 SENSITIVE Sensitive     CEFEPIME <=0.12 SENSITIVE Sensitive     CEFTAZIDIME <=1 SENSITIVE Sensitive     CEFTRIAXONE <=0.25 SENSITIVE Sensitive  CIPROFLOXACIN <=0.25 SENSITIVE Sensitive     GENTAMICIN <=1 SENSITIVE Sensitive     IMIPENEM <=0.25 SENSITIVE Sensitive     TRIMETH/SULFA <=20 SENSITIVE Sensitive     AMPICILLIN/SULBACTAM <=2  SENSITIVE Sensitive     PIP/TAZO <=4 SENSITIVE Sensitive ug/mL    * ESCHERICHIA COLI    ESCHERICHIA COLI  Blood Culture ID Panel (Reflexed)     Status: Abnormal   Collection Time: 05/13/23  2:40 AM  Result Value Ref Range Status   Enterococcus faecalis NOT DETECTED NOT DETECTED Final   Enterococcus Faecium NOT DETECTED NOT DETECTED Final   Listeria monocytogenes NOT DETECTED NOT DETECTED Final   Staphylococcus species NOT DETECTED NOT DETECTED Final   Staphylococcus aureus (BCID) NOT DETECTED NOT DETECTED Final   Staphylococcus epidermidis NOT DETECTED NOT DETECTED Final   Staphylococcus lugdunensis NOT DETECTED NOT DETECTED Final   Streptococcus species NOT DETECTED NOT DETECTED Final   Streptococcus agalactiae NOT DETECTED NOT DETECTED Final   Streptococcus pneumoniae NOT DETECTED NOT DETECTED Final   Streptococcus pyogenes NOT DETECTED NOT DETECTED Final   A.calcoaceticus-baumannii NOT DETECTED NOT DETECTED Final   Bacteroides fragilis NOT DETECTED NOT DETECTED Final   Enterobacterales DETECTED (A) NOT DETECTED Final    Comment: Enterobacterales represent a large order of gram negative bacteria, not a single organism. CRITICAL RESULT CALLED TO, READ BACK BY AND VERIFIED WITH: PHARMD AClinton Sawyer [phone] @ 2204 FH    Enterobacter cloacae complex NOT DETECTED NOT DETECTED Final   Escherichia coli DETECTED (A) NOT DETECTED Final    Comment: CRITICAL RESULT CALLED TO, READ BACK BY AND VERIFIED WITH: PHARMD AClinton Sawyer [phone] @ 2204 FH    Klebsiella aerogenes NOT DETECTED NOT DETECTED Final   Klebsiella oxytoca NOT DETECTED NOT DETECTED Final   Klebsiella pneumoniae NOT DETECTED NOT DETECTED Final   Proteus species NOT DETECTED NOT DETECTED Final   Salmonella species NOT DETECTED NOT DETECTED Final   Serratia marcescens NOT DETECTED NOT DETECTED Final   Haemophilus influenzae NOT DETECTED NOT DETECTED Final   Neisseria meningitidis NOT DETECTED NOT DETECTED Final   Pseudomonas  aeruginosa NOT DETECTED NOT DETECTED Final   Stenotrophomonas maltophilia NOT DETECTED NOT DETECTED Final   Candida albicans NOT DETECTED NOT DETECTED Final   Candida auris NOT DETECTED NOT DETECTED Final   Candida glabrata NOT DETECTED NOT DETECTED Final   Candida krusei NOT DETECTED NOT DETECTED Final   Candida parapsilosis NOT DETECTED NOT DETECTED Final   Candida tropicalis NOT DETECTED NOT DETECTED Final   Cryptococcus neoformans/gattii NOT DETECTED NOT DETECTED Final   CTX-M ESBL NOT DETECTED NOT DETECTED Final   Carbapenem resistance IMP NOT DETECTED NOT DETECTED Final   Carbapenem resistance KPC NOT DETECTED NOT DETECTED Final   Carbapenem resistance NDM NOT DETECTED NOT DETECTED Final   Carbapenem resist OXA 48 LIKE NOT DETECTED NOT DETECTED Final   Carbapenem resistance VIM NOT DETECTED NOT DETECTED Final    Comment: Performed at [facility], [address], [place], [place] [address]    RADIOLOGY STUDIES/RESULTS: No results found.   LOS: 1 day   Jeoffrey Massed, MD  [facility]    To contact the attending provider between 7A-7P or the covering provider during after hours 7P-7A, please log into the web site [email or web address] and access using universal [facility] password for that web site. If you do not have the password, please call the hospital operator.  05/15/2023, 11:03 AM

## 2023-05-16 ENCOUNTER — Other Ambulatory Visit (HOSPITAL_COMMUNITY): Payer: Self-pay

## 2023-05-16 ENCOUNTER — Inpatient Hospital Stay (HOSPITAL_COMMUNITY): Payer: Medicare HMO

## 2023-05-16 DIAGNOSIS — N3000 Acute cystitis without hematuria: Secondary | ICD-10-CM | POA: Diagnosis not present

## 2023-05-16 DIAGNOSIS — N179 Acute kidney failure, unspecified: Secondary | ICD-10-CM | POA: Diagnosis not present

## 2023-05-16 DIAGNOSIS — R7881 Bacteremia: Secondary | ICD-10-CM | POA: Diagnosis not present

## 2023-05-16 DIAGNOSIS — R55 Syncope and collapse: Secondary | ICD-10-CM | POA: Diagnosis not present

## 2023-05-16 IMAGING — RF DG UGI W/ HIGH DENSITY W/O KUB
12 of 19 series · 13 of 24 positions shown · non-contrast
Comparison: None.

CLINICAL DATA: New onset nausea with eating.

EXAM:
UPPER GI SERIES WITH KUB
TECHNIQUE: After obtaining a scout radiograph a routine upper GI series was
performed using thin and high density barium.
FLUOROSCOPY TIME:  Fluoroscopy Time:  1 minute 48 seconds
Radiation Exposure Index (if provided by the fluoroscopic device):
54.4 mGy
Number of Acquired Spot Images: 5

[Series 1: t abdomen supine · 0.15mm/px · 1 of 1 slices shown]
[im 1/1]
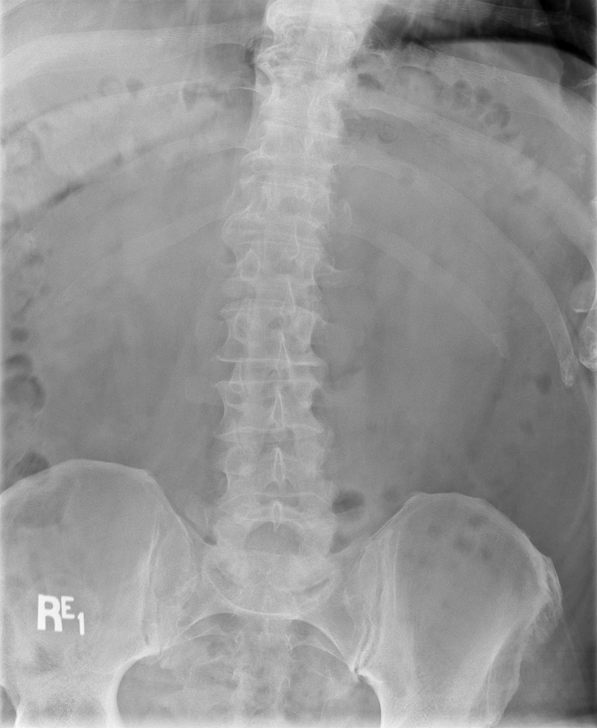

[Series 2: cp_standard · 0.36mm/px · 1 of 82 frames shown (1 of 10)]
[frame 42/82]
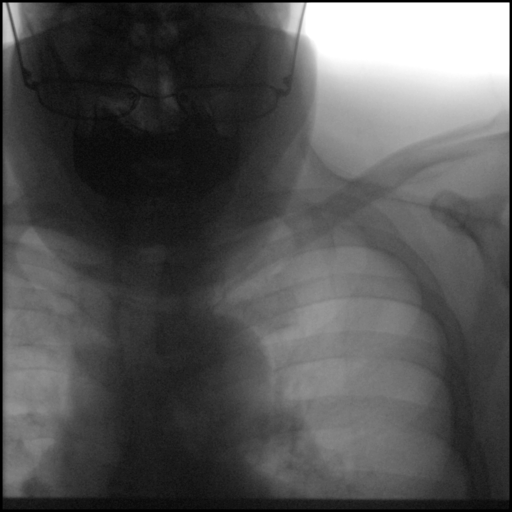

[Series 4: cp_standard · 0.35mm/px · 2 of 66 frames shown (2 of 10)]
[frame 2/66]
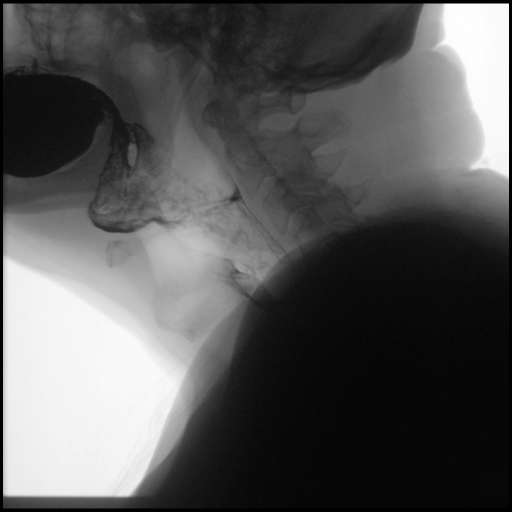
[frame 57/66]
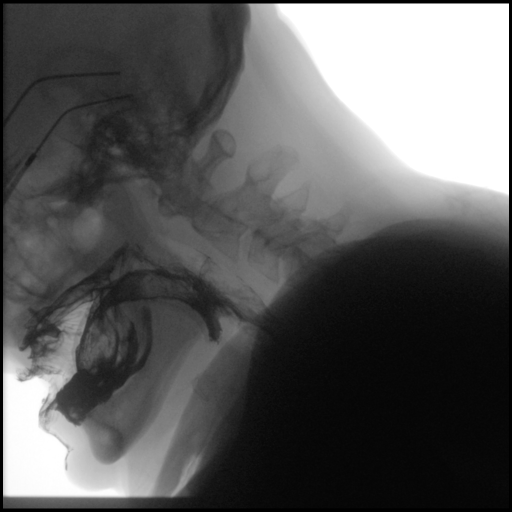

[Series 5: cp_standard · 0.35mm/px · 1 of 8 frames shown (3 of 10)]
[frame 5/8]
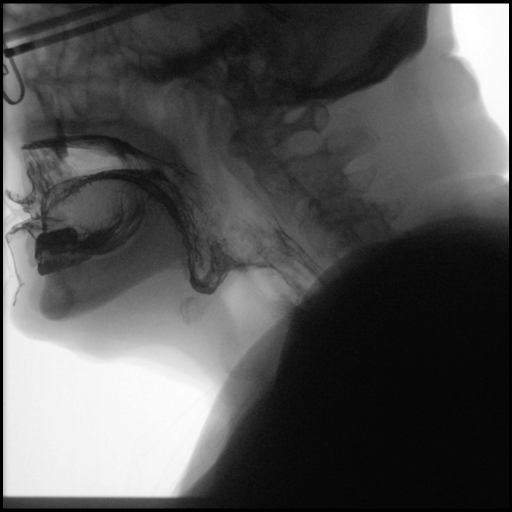

[Series 7: cp_standard · 0.26mm/px · 1 of 1 slices shown (4 of 10)]
[im 1/1]
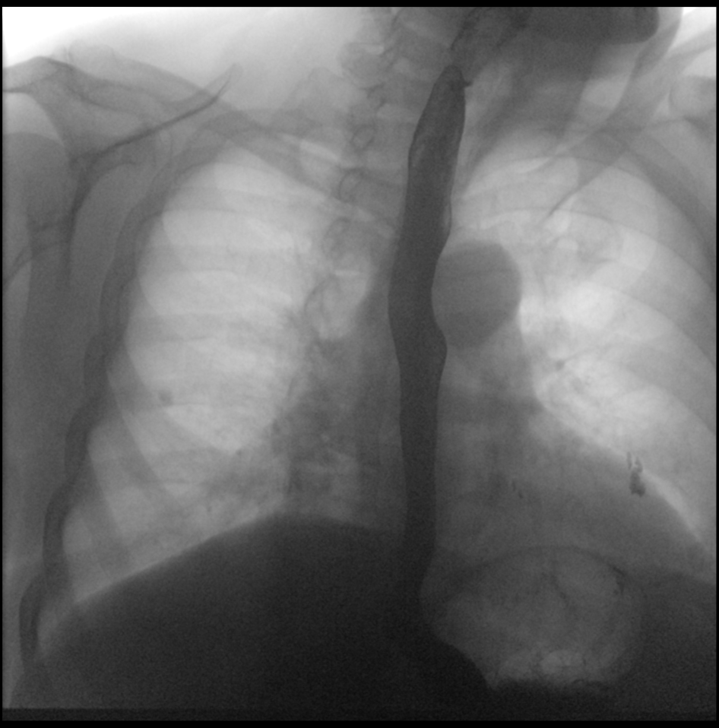

[Series 10: cp_standard · 0.27mm/px · 1 of 1 slices shown (5 of 10)]
[im 1/1]
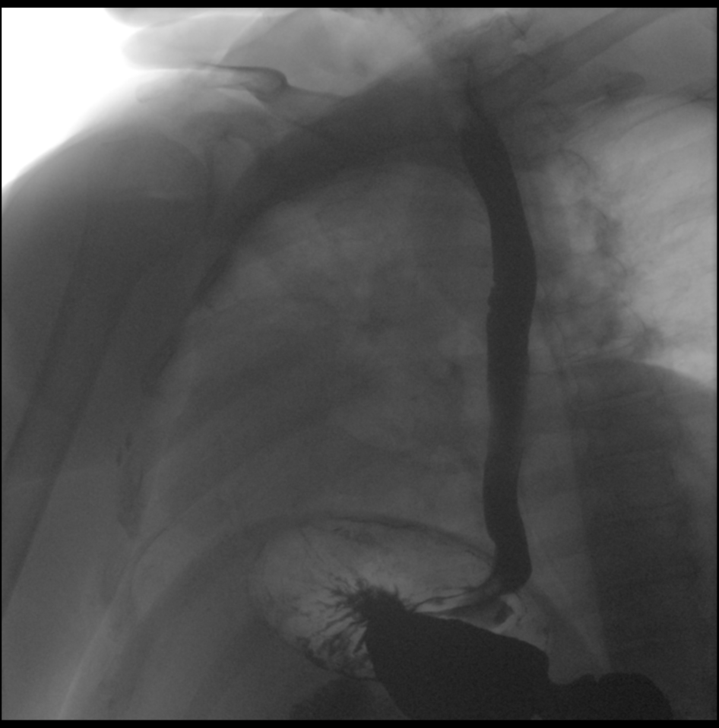

[Series 12: cp_standard · 0.27mm/px · 1 of 1 slices shown (6 of 10)]
[im 1/1]
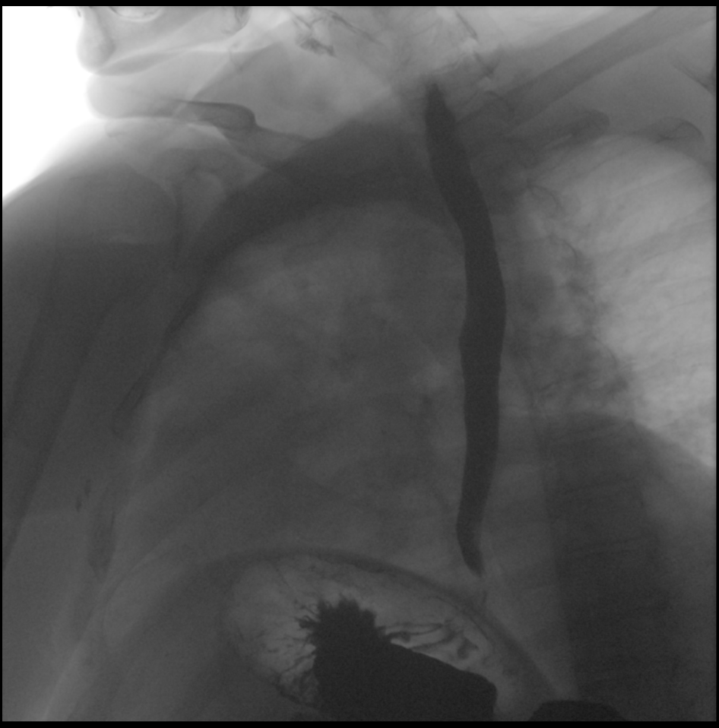

[Series 14: cp_standard · 0.54mm/px · 1 of 32 frames shown (7 of 10)]
[frame 17/32]
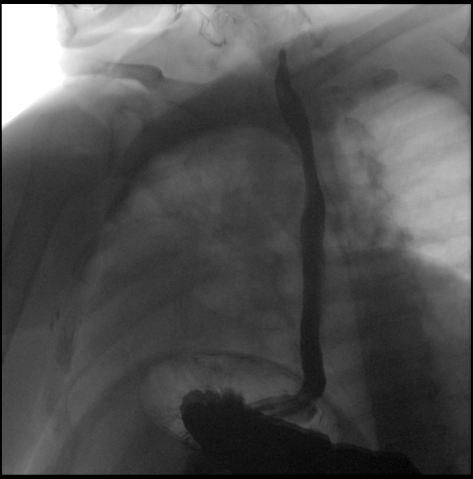

[Series 15: cp_standard · 0.27mm/px · 1 of 1 slices shown (8 of 10)]
[im 1/1]
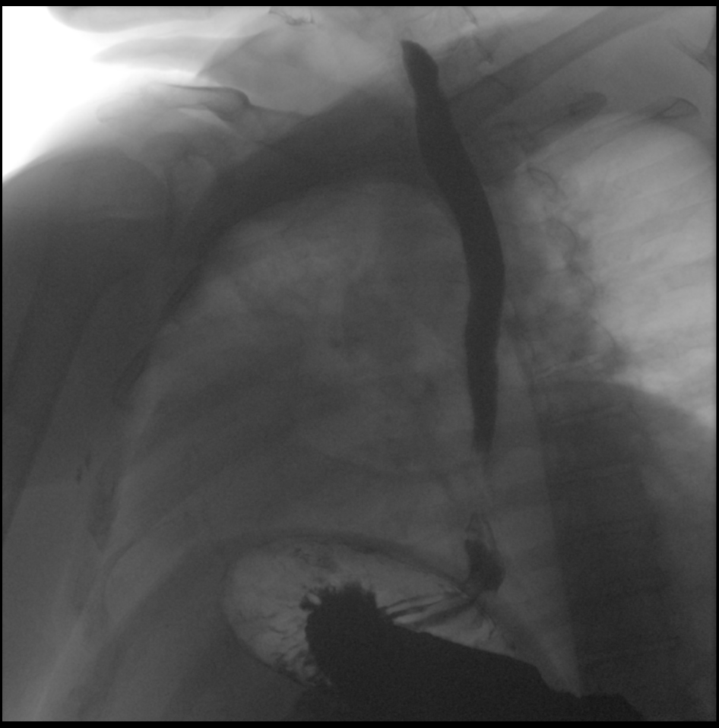

[Series 18: cp_standard · 0.27mm/px · 1 of 1 slices shown (9 of 10)]
[im 1/1]
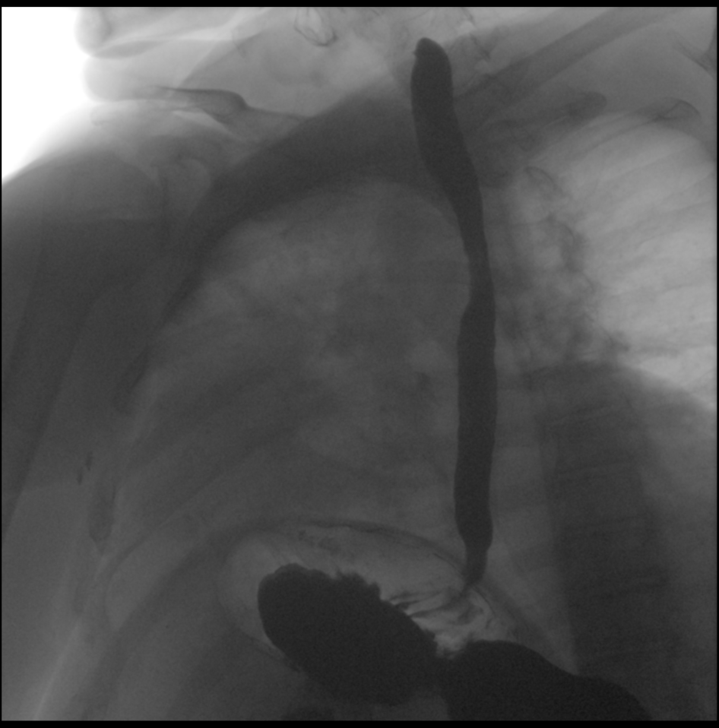

[Series 21: cp_standard · 0.18mm/px · 1 of 1 slices shown (10 of 10)]
[im 1/1]
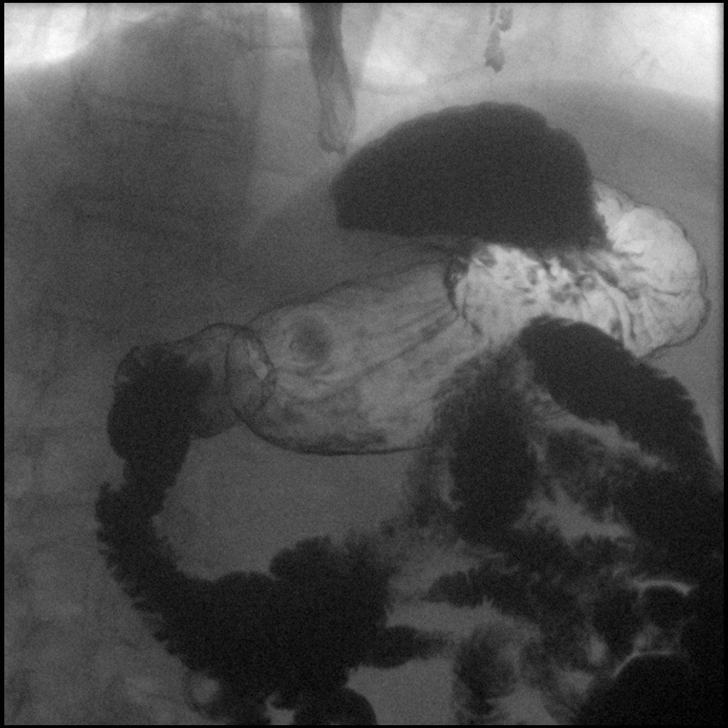

[Series 24: fluoro_barium 2fps_bw · 0.17mm/px · 1 of 1 slices shown]
[im 1/1]
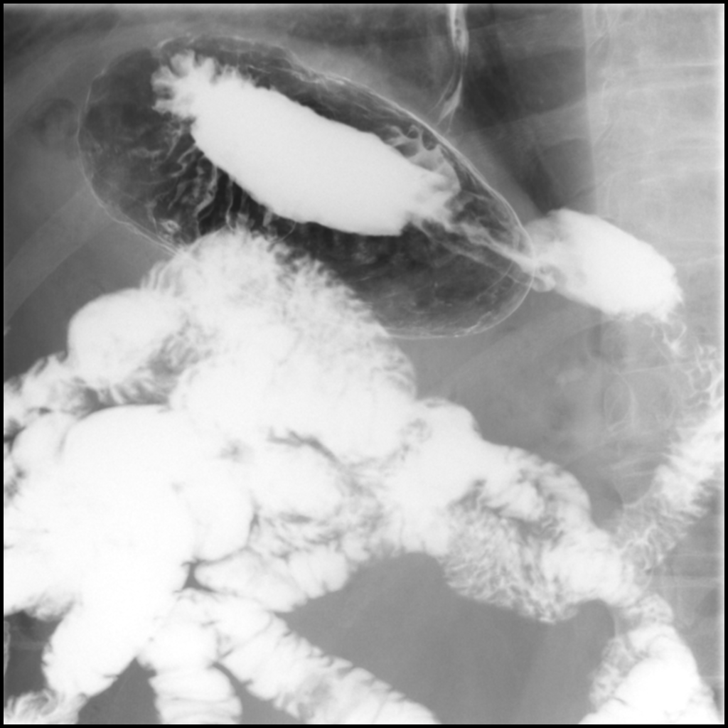

[13 of 24 positions shown; findings below may reference images not displayed]

FINDINGS: Scout view of the abdomen shows a normal bowel gas pattern. No
unexpected radiopaque calculi. Single swallow evaluation shows
delayed initiation with a fair amount of residual contrast in the
oropharynx. Sluggish esophageal motility with tertiary contractions.
No esophageal fold thickening, stricture or obstruction. Tiny hiatal
hernia. Stomach and duodenal bulb are unremarkable.
IMPRESSION: 1. Mild esophageal dysmotility.
2. Tiny hiatal hernia.

## 2023-05-16 MED ORDER — AMOXICILLIN 500 MG PO CAPS
1000.0000 mg | ORAL_CAPSULE | Freq: Three times a day (TID) | ORAL | 0 refills | Status: AC
  Filled 2023-05-16: qty 60, 10d supply, fill #0

## 2023-05-16 NOTE — Progress Notes (Signed)
Physical Therapy Treatment Patient Details Name: Calvin Escobar MRN: [id number] DOB: [birth year] Today's Date: 05/16/2023   History of Present Illness Pt is 72 yo male who presented to ED after unwitnessed fall at home. PMH includes: dementia, chronic HFrEF, bipolar affective disorder, tremors, hypertension, hyperlipidemia, OSA on BiPAP, BPH.    PT Comments  Pt received in supine and agreeable to session. Focus on gait and stair trials with pt requiring up to CGA for safety. Pt requires cues for pacing and safety due to decreased awareness and poor RW management, but no LOB. Education provided on activity progression and making sure family is present during mobility, especially while doing stairs. Pt continues to benefit from PT services to progress toward functional mobility goals.     If plan is discharge home, recommend the following: A little help with walking and/or transfers;A little help with bathing/dressing/bathroom;Assistance with cooking/housework;Direct supervision/assist for medications management;Assist for transportation;Help with stairs or ramp for entrance   Can travel by private vehicle        Equipment Recommendations  Rolling walker (2 wheels)    Recommendations for Other Services       Precautions / Restrictions Precautions Precautions: Fall Restrictions Weight Bearing Restrictions Per Provider Order: No     Mobility  Bed Mobility Overal bed mobility: Needs Assistance Bed Mobility: Supine to Sit     Supine to sit: Supervision, HOB elevated          Transfers Overall transfer level: Needs assistance Equipment used: Rolling walker (2 wheels) Transfers: Sit to/from Stand Sit to Stand: Contact guard assist           General transfer comment: from low EOB with cues for hand placement    Ambulation/Gait Ambulation/Gait assistance: Contact guard assist Gait Distance (Feet): 300 Feet Assistive device: Rolling walker (2 wheels) Gait  Pattern/deviations: Decreased stride length, Step-through pattern, Drifts right/left       General Gait Details: Pt demonstrates slightly unsteady gait with intermittent drifting requiring cues for awareness. Pt requires cues for RW management due to frequent tendency to pick up RW, especially during turns. CGA for safety   Stairs Stairs: Yes Stairs assistance: Contact guard assist Stair Management: Two rails, No rails Number of Stairs: 4 (x3) General stair comments: Pt completing 2 trials with B handrails and one trial with HHA to simulate home environment. Cues for sequence and pacing     Balance Overall balance assessment: Needs assistance Sitting-balance support: Feet supported, Bilateral upper extremity supported Sitting balance-Leahy Scale: Good     Standing balance support: Bilateral upper extremity supported, During functional activity, No upper extremity supported Standing balance-Leahy Scale: Fair Standing balance comment: with RW support                            Cognition Arousal: Alert Behavior During Therapy: WFL for tasks assessed/performed Overall Cognitive Status: History of cognitive impairments - at baseline                                 General Comments: increased cues for safety and awareness        Exercises      General Comments        Pertinent Vitals/Pain Pain Assessment Pain Assessment: No/denies pain     PT Goals (current goals can now be found in the care plan section) Acute Rehab PT Goals Patient Stated Goal: to go  home PT Goal Formulation: With patient Time For Goal Achievement: 05/29/23 Progress towards PT goals: Progressing toward goals    Frequency    Min 1X/week       AM-PAC PT "6 Clicks" Mobility   Outcome Measure  Help needed turning from your back to your side while in a flat bed without using bedrails?: A Little Help needed moving from lying on your back to sitting on the side of a  flat bed without using bedrails?: A Little Help needed moving to and from a bed to a chair (including a wheelchair)?: A Little Help needed standing up from a chair using your arms (e.g., wheelchair or bedside chair)?: A Little Help needed to walk in hospital room?: A Little Help needed climbing 3-5 steps with a railing? : A Little 6 Click Score: 18    End of Session Equipment Utilized During Treatment: Gait belt Activity Tolerance: Patient tolerated treatment well Patient left: in chair;with call bell/phone within reach;with chair alarm set Nurse Communication: Mobility status PT Visit Diagnosis: Other abnormalities of gait and mobility (R26.89)     Time: 1431-1457 PT Time Calculation (min) (ACUTE ONLY): 26 min  Charges:    $Gait Training: 23-37 mins PT General Charges $$ ACUTE PT VISIT: 1 Visit                     Johny Shock, PTA Acute Rehabilitation Services Secure Chat Preferred  [phone]    Johny Shock 05/16/2023, 3:14 PM

## 2023-05-16 NOTE — Plan of Care (Signed)

## 2023-05-16 NOTE — Discharge Summary (Signed)
PATIENT DETAILS Name: Diony Sinibaldi Age: 72 y.o. Sex: male Date of Birth: 1950-11-10 MRN: [id number]. Admitting Physician: Carollee Herter, DO [id number], Johnella Moloney, MD  Admit Date: 05/12/2023 Discharge date: 05/16/2023  Recommendations for Outpatient Follow-up:  Follow up with PCP in 1-2 weeks Please obtain CMP/CBC in one week Ensure follow-up with urology  Admitted From:  Home  Disposition: Home health   Discharge Condition: good  CODE STATUS:   Code Status: Full Code   Diet recommendation:  Diet Order             Diet - low sodium heart healthy           Diet Heart Room service appropriate? Yes; Fluid consistency: Thin  Diet effective now                    Brief Summary: Patient is a 72 y.o.  male with history of chronic HFrEF (recovered EF), HTN, HLD, BPH, dementia, bipolar disorder-who presented with unwitnessed fall/was found on the floor with family-he was confused--initially hypotensive on arrival to the ED-upon further evaluation-he was found to have E. coli bacteremia.   Significant events: 12/8>> admit to [facility]   Significant studies: 10/11>> echo: EF 35-40% 12/08>> CXR: No PNA 12/09>> CT head: No acute intracranial abnormality 12/09>> CT C-spine: No fracture. 12/09>> echo: EF 50-55% 12/12>> CT renal stone study: Wall thickening of urinary bladder-moderate volume of air.  Mild left hydroureteronephrosis.  Mildly enlarged prostate gland.   Significant microbiology data: 12/08>> urine culture: E. coli 12/09>> blood culture: E. Coli  Brief Hospital Course: Severe sepsis secondary to E. coli bacteremia (present on admission) secondary to complicated  E. 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 CT renal stone finding discussed with Dr. Molli Hazard Gay-urology on 12/12  over the phone-recommendations are for 2 weeks of antibiotics given concern for emphysematous cystitis.  His office will arrange for outpatient follow-up.   Acute metabolic encephalopathy  Secondary to severe sepsis/hypotension Resolved-completely awake/alert this morning   AKI Likely hemodynamically mediated kidney injury in the setting of severe sepsis/hypotension Resolved with supportive care   Syncope vs mechanical fall Telemetry benign Echo with stable-recovered EF Suspect this is all from hypotension/sepsis physiology.   Normocytic anemia No evidence of hematochezia/melena Probably secondary to IV fluid dilution/acute illness No evidence of overt bleeding during this hospitalization-PCP to follow CBC.  Should improve with treatment of underlying infectious issues.   Chronic HFrEF with recovered ejection fraction Euvolemic Coreg Does not require diuretics   HTN BP creeping up Coreg to be continued-resume losartan.   BPH Flomax No evidence of retention by exam or by bladder scans Outpatient follow-up with urology.   Intention tremor Primodine   Dementia Bipolar disorder Stable Abilify/Depakote/BuSpar/trazodone/Remeron   OSA BiPAP  Debility/deconditioning PT/OT eval-Home health recommended.   Class II Obesity Estimated body mass index is 38.9 kg/m as calculated from the following:   Height as of 05/08/23: 5\' 6"  (1.676 m).   Weight as of 05/08/23: 109.3 kg.   Discharge Diagnoses:  Principal Problem:   Syncope Active Problems:   E coli bacteremia   HFrEF (heart failure with reduced ejection fraction) (HCC)   Acute cystitis without hematuria   Diarrhea   AKI (acute kidney injury) (HCC)   Hyponatremia   Dementia (HCC)   OSA treated with BiPAP   Normocytic anemia   Obesity, Class II,  BMI 35-39.9   Discharge Instructions:  Activity:  As tolerated with Full fall precautions use walker/cane & assistance as needed   Discharge Instructions     Call  MD for:  extreme fatigue   Complete by: As directed    Call MD for:  persistant dizziness or light-headedness   Complete by: As directed    Call MD for:  persistant nausea and vomiting   Complete by: As directed    Diet - low sodium heart healthy   Complete by: As directed    Discharge instructions   Complete by: As directed    Follow with Primary MD  [name], MD in 1-2 weeks  Follow-up with urology-[facility] will call you with a follow-up appointment  Please get a complete blood count and chemistry panel checked by your Primary MD at your next visit, and again as instructed by your Primary MD.  Get Medicines reviewed and adjusted: Please take all your medications with you for your next visit with your Primary MD  Laboratory/radiological data: Please request your Primary MD to go over all hospital tests and procedure/radiological results at the follow up, please ask your Primary MD to get all Hospital records sent to his/her office.  In some cases, they will be blood work, cultures and biopsy results pending at the time of your discharge. Please request that your primary care M.D. follows up on these results.  Also Note the following: If you experience worsening of your admission symptoms, develop shortness of breath, life threatening emergency, suicidal or homicidal thoughts you must seek medical attention immediately by calling 911 or calling your MD immediately  if symptoms less severe.  You must read complete instructions/literature along with all the possible adverse reactions/side effects for all the Medicines you take and that have been prescribed to you. Take any new Medicines after you have completely understood and accpet all the possible adverse reactions/side effects.   Do not drive when taking Pain medications or sleeping medications (Benzodaizepines)  Do not take more than prescribed Pain, Sleep and Anxiety Medications. It is not advisable to combine  anxiety,sleep and pain medications without talking with your primary care practitioner  Special Instructions: If you have smoked or chewed Tobacco  in the last 2 yrs please stop smoking, stop any regular Alcohol  and or any Recreational drug use.  Wear Seat belts while driving.  Please note: You were cared for by a hospitalist during your hospital stay. Once you are discharged, your primary care physician will handle any further medical issues. Please note that NO REFILLS for any discharge medications will be authorized once you are discharged, as it is imperative that you return to your primary care physician (or establish a relationship with a primary care physician if you do not have one) for your post hospital discharge needs so that they can reassess your need for medications and monitor your lab values.   Increase activity slowly   Complete by: As directed       Allergies as of 05/16/2023   No Known Allergies      Medication List     STOP taking these medications    ondansetron 4 MG tablet Commonly known as: Zofran       TAKE these medications    amoxicillin 500 MG capsule Commonly known as: AMOXIL Take 2 capsules (1,000 mg total) by mouth 3 (three) times daily for 10 days.   ARIPiprazole 2 MG tablet Commonly known as: ABILIFY Take 2  mg by mouth daily.   ascorbic acid 500 MG tablet Commonly known as: VITAMIN C Take 500 mg by mouth daily.   busPIRone 10 MG tablet Commonly known as: BUSPAR Take 10 mg by mouth 3 (three) times daily.   calcium-vitamin D 500-5 MG-MCG tablet Commonly known as: OSCAL WITH D Take 1 tablet by mouth daily with breakfast.   carvedilol 12.5 MG tablet Commonly known as: COREG Take 1 tablet (12.5 mg total) by mouth 2 (two) times daily with a meal.   divalproex 250 MG 24 hr tablet Commonly known as: DEPAKOTE ER Take 500 mg by mouth at bedtime.   folic acid 1 MG tablet Commonly known as: FOLVITE Take 1 tablet (1 mg total) by mouth  daily.   gabapentin 300 MG capsule Commonly known as: NEURONTIN Take 300 mg by mouth at bedtime.   LORazepam 1 MG tablet Commonly known as: ATIVAN Take one tablet by mouth in the morning and two tablets at bedtime   losartan 50 MG tablet Commonly known as: COZAAR Take 1 tablet (50 mg total) by mouth daily. Replaces entresto, start after done with entresto   mirtazapine 30 MG tablet Commonly known as: REMERON Take 30 mg by mouth at bedtime.   multivitamin tablet Take 1 tablet by mouth daily.   pravastatin 20 MG tablet Commonly known as: PRAVACHOL Take 1 tablet (20 mg total) by mouth at bedtime.   primidone 50 MG tablet Commonly known as: MYSOLINE Take 1 tablet (50 mg total) by mouth 4 (four) times daily as needed. For tremor, intention tremor. What changed:  reasons to take this additional instructions   tamsulosin 0.4 MG Caps capsule Commonly known as: FLOMAX TAKE 1 CAPSULE BY MOUTH EVERY DAY   traZODone 100 MG tablet Commonly known as: DESYREL Take 300 mg by mouth at bedtime.   venlafaxine XR 150 MG 24 hr capsule Commonly known as: EFFEXOR-XR Take 300 mg by mouth daily with breakfast.        Follow-up Information     [name], MD. Schedule an appointment as soon as possible for a visit in 1 week(s).   Specialty: Internal Medicine Contact information: [address] [place] [address] [phone]         Jannifer Hick, MD Follow up.   Specialty: Urology Why: Office will call with date/time, If you dont hear from them,please give them a call Contact information: [address] [place] [address] [phone]                No Known Allergies   Other Procedures/Studies: CT RENAL STONE STUDY Result Date: 05/16/2023 CLINICAL DATA:  Flank pain, bacteremia, UTI EXAM: CT ABDOMEN AND PELVIS WITHOUT CONTRAST TECHNIQUE: Multidetector CT imaging of the abdomen and pelvis was performed following the standard protocol without IV  contrast. RADIATION DOSE REDUCTION: This exam was performed according to the departmental dose-optimization program which includes automated exposure control, adjustment of the mA and/or kV according to patient size and/or use of iterative reconstruction technique. COMPARISON:  09/12/2016 FINDINGS: Lower chest: No acute abnormality. Relative hypoattenuation of the cardiac blood pool indicative of anemia. Hepatobiliary: Unremarkable unenhanced appearance of the liver. No focal liver lesion identified. Gallbladder within normal limits. No hyperdense gallstone. No biliary dilatation. Pancreas: Unremarkable. No pancreatic ductal dilatation or surrounding inflammatory changes. Spleen: Normal in size without focal abnormality. Adrenals/Urinary Tract: Unremarkable adrenal glands. Mild left hydroureteronephrosis. No renal or ureteral calculi. No solid renal lesion. No right-sided hydronephrosis. There is wall  thickening of the urinary bladder, most pronounced posteriorly. Moderate volume of air within the bladder lumen. Stomach/Bowel: Stomach is within normal limits. Appendix appears normal. No dilated loops of bowel. Extensive colonic diverticulosis. There is mild fat stranding adjacent to the proximal sigmoid colon. There is mild long segment wall thickening of the sigmoid colon. No focally inflamed diverticulum is identified. Vascular/Lymphatic: Aortic atherosclerosis. No enlarged abdominal or pelvic lymph nodes. Reproductive: Mildly enlarged prostate gland. Other: No free fluid. No abdominopelvic fluid collection. No pneumoperitoneum. No abdominal wall hernia. Musculoskeletal: No acute or significant osseous findings. IMPRESSION: 1. Wall thickening of the urinary bladder, most pronounced posteriorly. Moderate volume of air within the bladder lumen. Findings are suggestive of cystitis. Correlate with urinalysis. 2. Mild left hydroureteronephrosis. No renal or ureteral calculi. 3. Extensive colonic diverticulosis. Mild  fat stranding adjacent to the proximal sigmoid colon. There is mild long segment wall thickening of the sigmoid colon, which may reflect sequela of chronic diverticulitis. Early changes of diverticulitis is not excluded. Correlate with patient's symptoms. 4. Mildly enlarged prostate gland. 5. Relative hypoattenuation of the cardiac blood pool indicative of anemia. 6. Aortic atherosclerosis (ICD10-I70.0). Electronically Signed   By: Duanne Guess D.O.   On: 05/16/2023 11:33   ECHOCARDIOGRAM COMPLETE Result Date: 05/13/2023    ECHOCARDIOGRAM REPORT   Patient Name:   GRANTLY SHAUT Date of Exam: 05/13/2023 Medical Rec #:  [id number]   Height:       66.0 in Accession #:    [id number]  Weight:       241.0 lb Date of Birth:  November 28, 1950   BSA:          2.165 m Patient Age:    72 years    BP:           126/73 mmHg Patient Gender: M           HR:           77 bpm. Exam Location:  Inpatient Procedure: 2D Echo, Cardiac Doppler, Color Doppler and Intracardiac            Opacification Agent Indications:    Syncope  History:        Patient has prior history of Echocardiogram examinations, most                 recent 03/15/2023. Risk Factors:Hypertension, Dyslipidemia and                 Sleep Apnea.  Sonographer:    Karma Ganja Referring Phys: [id number] RATHORE  Sonographer Comments: Technically difficult study due to poor echo windows. Image acquisition challenging due to respiratory motion. IMPRESSIONS  1. Abnormal septal motion. Left ventricular ejection fraction, by estimation, is 50 to 55%. The left ventricle has low normal function. The left ventricle has no regional wall motion abnormalities. There is mild left ventricular hypertrophy. Left ventricular diastolic parameters were normal.  2. Right ventricular systolic function is normal. The right ventricular size is normal. There is normal pulmonary artery systolic pressure.  3. The mitral valve is normal in structure. No evidence of mitral valve regurgitation. No  evidence of mitral stenosis.  4. The aortic valve is tricuspid. There is moderate calcification of the aortic valve. There is moderate thickening of the aortic valve. Aortic valve regurgitation is not visualized. Aortic valve sclerosis is present, with no evidence of aortic valve stenosis.  5. The inferior vena cava is normal in size with greater than 50% respiratory variability, suggesting right atrial pressure of  3 mmHg. FINDINGS  Left Ventricle: Abnormal septal motion. Left ventricular ejection fraction, by estimation, is 50 to 55%. The left ventricle has low normal function. The left ventricle has no regional wall motion abnormalities. The left ventricular internal cavity size was normal in size. There is mild left ventricular hypertrophy. Left ventricular diastolic parameters were normal. Right Ventricle: The right ventricular size is normal. No increase in right ventricular wall thickness. Right ventricular systolic function is normal. There is normal pulmonary artery systolic pressure. The tricuspid regurgitant velocity is 2.74 m/s, and  with an assumed right atrial pressure of 3 mmHg, the estimated right ventricular systolic pressure is 33.0 mmHg. Left Atrium: Left atrial size was normal in size. Right Atrium: Right atrial size was normal in size. Pericardium: There is no evidence of pericardial effusion. Mitral Valve: The mitral valve is normal in structure. No evidence of mitral valve regurgitation. No evidence of mitral valve stenosis. Tricuspid Valve: The tricuspid valve is normal in structure. Tricuspid valve regurgitation is not demonstrated. No evidence of tricuspid stenosis. Aortic Valve: The aortic valve is tricuspid. There is moderate calcification of the aortic valve. There is moderate thickening of the aortic valve. Aortic valve regurgitation is not visualized. Aortic valve sclerosis is present, with no evidence of aortic valve stenosis. Aortic valve mean gradient measures 4.0 mmHg. Aortic valve  peak gradient measures 6.4 mmHg. Aortic valve area, by VTI measures 2.71 cm. Pulmonic Valve: The pulmonic valve was normal in structure. Pulmonic valve regurgitation is not visualized. No evidence of pulmonic stenosis. Aorta: The aortic root is normal in size and structure. Venous: The inferior vena cava is normal in size with greater than 50% respiratory variability, suggesting right atrial pressure of 3 mmHg. IAS/Shunts: No atrial level shunt detected by color flow Doppler.  LEFT VENTRICLE PLAX 2D LVIDd:         5.00 cm      Diastology LVIDs:         3.90 cm      LV e' medial:    7.29 cm/s LV PW:         1.20 cm      LV E/e' medial:  9.3 LV IVS:        1.30 cm      LV e' lateral:   15.10 cm/s LVOT diam:     2.10 cm      LV E/e' lateral: 4.5 LV SV:         64 LV SV Index:   29 LVOT Area:     3.46 cm  LV Volumes (MOD) LV vol d, MOD A2C: 113.0 ml LV vol d, MOD A4C: 139.0 ml LV vol s, MOD A2C: 56.9 ml LV vol s, MOD A4C: 67.0 ml LV SV MOD A2C:     56.1 ml LV SV MOD A4C:     139.0 ml LV SV MOD BP:      64.9 ml RIGHT VENTRICLE RV Basal diam:  3.70 cm RV S prime:     19.10 cm/s TAPSE (M-mode): 3.2 cm LEFT ATRIUM             Index        RIGHT ATRIUM           Index LA diam:        3.70 cm 1.71 cm/m   RA Area:     15.00 cm LA Vol (A2C):   74.3 ml 34.32 ml/m  RA Volume:   31.00 ml  14.32 ml/m LA  Vol (A4C):   52.9 ml 24.44 ml/m LA Biplane Vol: 67.8 ml 31.32 ml/m  AORTIC VALVE AV Area (Vmax):    2.75 cm AV Area (Vmean):   2.66 cm AV Area (VTI):     2.71 cm AV Vmax:           126.00 cm/s AV Vmean:          86.900 cm/s AV VTI:            0.235 m AV Peak Grad:      6.4 mmHg AV Mean Grad:      4.0 mmHg LVOT Vmax:         100.00 cm/s LVOT Vmean:        66.700 cm/s LVOT VTI:          0.184 m LVOT/AV VTI ratio: 0.78  AORTA Ao Root diam: 3.80 cm Ao Asc diam:  3.90 cm MITRAL VALVE               TRICUSPID VALVE MV Area (PHT): 3.93 cm    TR Peak grad:   30.0 mmHg MV Decel Time: 193 msec    TR Vmax:        274.00 cm/s MV E  velocity: 67.90 cm/s MV A velocity: 83.90 cm/s  SHUNTS MV E/A ratio:  0.81        Systemic VTI:  0.18 m                            Systemic Diam: 2.10 cm Charlton Haws MD Electronically signed by Charlton Haws MD Signature Date/Time: 05/13/2023/10:16:49 AM    Final    CT HEAD WO CONTRAST ( ) Result Date: 05/13/2023 CLINICAL DATA:  Unwitnessed fall, altered mental status EXAM: CT HEAD WITHOUT CONTRAST CT CERVICAL SPINE WITHOUT CONTRAST TECHNIQUE: Multidetector CT imaging of the head and cervical spine was performed following the standard protocol without intravenous contrast. Multiplanar CT image reconstructions of the cervical spine were also generated. RADIATION DOSE REDUCTION: This exam was performed according to the departmental dose-optimization program which includes automated exposure control, adjustment of the mA and/or kV according to patient size and/or use of iterative reconstruction technique. COMPARISON:  CT head dated 01/29/2016 FINDINGS: CT HEAD FINDINGS Brain: No evidence of acute infarction, hemorrhage, hydrocephalus, extra-axial collection or mass lesion/mass effect. Global cortical atrophy. Mild subcortical white matter and periventricular small vessel ischemic changes. Vascular: No hyperdense vessel or unexpected calcification. Skull: Normal. Negative for fracture or focal lesion. Sinuses/Orbits: The visualized paranasal sinuses are essentially clear. The mastoid air cells are unopacified. Other: None. CT CERVICAL SPINE FINDINGS Alignment: Loss of the normal cervical lordosis. Skull base and vertebrae: No acute fracture. No primary bone lesion or focal pathologic process. Soft tissues and spinal canal: No prevertebral fluid or swelling. No visible canal hematoma. Disc levels: Mild degenerative changes of the mid/lower cervical spine. Spinal canal is patent. Upper chest: Negative. Other: None. IMPRESSION: No acute intracranial abnormality. Atrophy with small vessel ischemic changes. No traumatic  injury to the cervical spine. Mild degenerative changes. Electronically Signed   By: Charline Bills M.D.   On: 05/13/2023 00:24   CT CERVICAL SPINE WO CONTRAST Result Date: 05/13/2023 CLINICAL DATA:  Unwitnessed fall, altered mental status EXAM: CT HEAD WITHOUT CONTRAST CT CERVICAL SPINE WITHOUT CONTRAST TECHNIQUE: Multidetector CT imaging of the head and cervical spine was performed following the standard protocol without intravenous contrast. Multiplanar CT image reconstructions of the cervical spine were also  generated. RADIATION DOSE REDUCTION: This exam was performed according to the departmental dose-optimization program which includes automated exposure control, adjustment of the mA and/or kV according to patient size and/or use of iterative reconstruction technique. COMPARISON:  CT head dated 01/29/2016 FINDINGS: CT HEAD FINDINGS Brain: No evidence of acute infarction, hemorrhage, hydrocephalus, extra-axial collection or mass lesion/mass effect. Global cortical atrophy. Mild subcortical white matter and periventricular small vessel ischemic changes. Vascular: No hyperdense vessel or unexpected calcification. Skull: Normal. Negative for fracture or focal lesion. Sinuses/Orbits: The visualized paranasal sinuses are essentially clear. The mastoid air cells are unopacified. Other: None. CT CERVICAL SPINE FINDINGS Alignment: Loss of the normal cervical lordosis. Skull base and vertebrae: No acute fracture. No primary bone lesion or focal pathologic process. Soft tissues and spinal canal: No prevertebral fluid or swelling. No visible canal hematoma. Disc levels: Mild degenerative changes of the mid/lower cervical spine. Spinal canal is patent. Upper chest: Negative. Other: None. IMPRESSION: No acute intracranial abnormality. Atrophy with small vessel ischemic changes. No traumatic injury to the cervical spine. Mild degenerative changes. Electronically Signed   By: Charline Bills M.D.   On: 05/13/2023  00:24   DG Chest Port 1 View Result Date: 05/13/2023 CLINICAL DATA:  Un witnessed fall, initial encounter EXAM: PORTABLE CHEST 1 VIEW COMPARISON:  01/29/16 FINDINGS: Cardiac shadow is mildly prominent but stable. Lungs are hypoinflated. Calcified granuloma is noted in the right mid lung stable from the prior exam. No acute bony abnormality is seen. IMPRESSION: Changes of prior granulomatous disease. No acute abnormality noted. Electronically Signed   By: Alcide Clever M.D.   On: 05/13/2023 00:03     TODAY-DAY OF DISCHARGE:  Subjective:   [name] today has no headache,no chest abdominal pain,no new weakness tingling or numbness, feels much better wants to go home today.   Objective:   Blood pressure 126/69, pulse 85, temperature 98.1 F (36.7 C), temperature source Oral, resp. rate 15, SpO2 91%.  Intake/Output Summary (Last 24 hours) at 05/16/2023 1208 Last data filed at 05/16/2023 1054 Gross per 24 hour  Intake --  Output 2000 ml  Net -2000 ml   There were no vitals filed for this visit.  Exam: Awake Alert, Oriented *3, No new F.N deficits, Normal affect [place].AT,PERRAL Supple Neck,No JVD, No cervical lymphadenopathy appriciated.  Symmetrical Chest wall movement, Good air movement bilaterally, CTAB RRR,No Gallops,Rubs or new Murmurs, No Parasternal Heave +ve B.Sounds, Abd Soft, Non tender, No organomegaly appriciated, No rebound -guarding or rigidity. No Cyanosis, Clubbing or edema, No new Rash or bruise   PERTINENT RADIOLOGIC STUDIES: CT RENAL STONE STUDY Result Date: 05/16/2023 CLINICAL DATA:  Flank pain, bacteremia, UTI EXAM: CT ABDOMEN AND PELVIS WITHOUT CONTRAST TECHNIQUE: Multidetector CT imaging of the abdomen and pelvis was performed following the standard protocol without IV contrast. RADIATION DOSE REDUCTION: This exam was performed according to the departmental dose-optimization program which includes automated exposure control, adjustment of the mA and/or kV  according to patient size and/or use of iterative reconstruction technique. COMPARISON:  09/12/2016 FINDINGS: Lower chest: No acute abnormality. Relative hypoattenuation of the cardiac blood pool indicative of anemia. Hepatobiliary: Unremarkable unenhanced appearance of the liver. No focal liver lesion identified. Gallbladder within normal limits. No hyperdense gallstone. No biliary dilatation. Pancreas: Unremarkable. No pancreatic ductal dilatation or surrounding inflammatory changes. Spleen: Normal in size without focal abnormality. Adrenals/Urinary Tract: Unremarkable adrenal glands. Mild left hydroureteronephrosis. No renal or ureteral calculi. No solid renal lesion. No right-sided hydronephrosis. There is wall thickening of the  urinary bladder, most pronounced posteriorly. Moderate volume of air within the bladder lumen. Stomach/Bowel: Stomach is within normal limits. Appendix appears normal. No dilated loops of bowel. Extensive colonic diverticulosis. There is mild fat stranding adjacent to the proximal sigmoid colon. There is mild long segment wall thickening of the sigmoid colon. No focally inflamed diverticulum is identified. Vascular/Lymphatic: Aortic atherosclerosis. No enlarged abdominal or pelvic lymph nodes. Reproductive: Mildly enlarged prostate gland. Other: No free fluid. No abdominopelvic fluid collection. No pneumoperitoneum. No abdominal wall hernia. Musculoskeletal: No acute or significant osseous findings. IMPRESSION: 1. Wall thickening of the urinary bladder, most pronounced posteriorly. Moderate volume of air within the bladder lumen. Findings are suggestive of cystitis. Correlate with urinalysis. 2. Mild left hydroureteronephrosis. No renal or ureteral calculi. 3. Extensive colonic diverticulosis. Mild fat stranding adjacent to the proximal sigmoid colon. There is mild long segment wall thickening of the sigmoid colon, which may reflect sequela of chronic diverticulitis. Early changes of  diverticulitis is not excluded. Correlate with patient's symptoms. 4. Mildly enlarged prostate gland. 5. Relative hypoattenuation of the cardiac blood pool indicative of anemia. 6. Aortic atherosclerosis (ICD10-I70.0). Electronically Signed   By: Duanne Guess D.O.   On: 05/16/2023 11:33     PERTINENT LAB RESULTS: CBC: Recent Labs    05/14/23 0402  WBC 10.1  HGB 8.4*  HCT 28.2*  PLT 220   CMET CMP     Component Value Date/Time   NA 140 05/15/2023 0442   NA 143 04/14/2021 1125   K 4.0 05/15/2023 0442   CL 108 05/15/2023 0442   CO2 26 05/15/2023 0442   GLUCOSE 95 05/15/2023 0442   BUN 15 05/15/2023 0442   BUN 21 04/14/2021 1125   CREATININE 1.00 05/15/2023 0442   CALCIUM 8.1 (L) 05/15/2023 0442   PROT 5.8 (L) 05/15/2023 0442   PROT 7.0 10/28/2019 1629   ALBUMIN 1.8 (L) 05/15/2023 0442   ALBUMIN 4.2 10/28/2019 1629   AST 21 05/15/2023 0442   ALT 14 05/15/2023 0442   ALKPHOS 42 05/15/2023 0442   BILITOT 0.3 05/15/2023 0442   BILITOT 0.4 10/28/2019 1629   GFR 62.20 01/15/2023 1243   EGFR 81 04/14/2021 1125   GFRNONAA >60 05/15/2023 0442    GFR Estimated Creatinine Clearance: 75.2 mL/min (by C-G formula based on SCr of 1 mg/dL). No results for input(s): "LIPASE", "AMYLASE" in the last 72 hours. No results for input(s): "CKTOTAL", "CKMB", "CKMBINDEX", "TROPONINI" in the last 72 hours. Invalid input(s): "POCBNP" No results for input(s): "DDIMER" in the last 72 hours. No results for input(s): "HGBA1C" in the last 72 hours. No results for input(s): "CHOL", "HDL", "LDLCALC", "TRIG", "CHOLHDL", "LDLDIRECT" in the last 72 hours. No results for input(s): "TSH", "T4TOTAL", "T3FREE", "THYROIDAB" in the last 72 hours.  Invalid input(s): "FREET3" No results for input(s): "VITAMINB12", "FOLATE", "FERRITIN", "TIBC", "IRON", "RETICCTPCT" in the last 72 hours. Coags: No results for input(s): "INR" in the last 72 hours.  Invalid input(s): "PT" Microbiology: Recent Results (from  the past 240 hours)  Urine Culture     Status: Abnormal   Collection Time: 05/12/23 11:37 PM   Specimen: Urine, Random  Result Value Ref Range Status   Specimen Description URINE, RANDOM  Final   Special Requests   Final    NONE Reflexed from [id number] Performed at [facility], [address] [place], [place] [address]    Culture >=100,000 COLONIES/mL ESCHERICHIA COLI (A)  Final   Report Status 05/15/2023 FINAL  Final   Organism  ID, Bacteria ESCHERICHIA COLI (A)  Final      Susceptibility   Escherichia coli - MIC*    AMPICILLIN <=2 SENSITIVE Sensitive     CEFAZOLIN <=4 SENSITIVE Sensitive     CEFEPIME <=0.12 SENSITIVE Sensitive     CEFTRIAXONE <=0.25 SENSITIVE Sensitive     CIPROFLOXACIN <=0.25 SENSITIVE Sensitive     GENTAMICIN <=1 SENSITIVE Sensitive     IMIPENEM <=0.25 SENSITIVE Sensitive     NITROFURANTOIN <=16 SENSITIVE Sensitive     TRIMETH/SULFA <=20 SENSITIVE Sensitive     AMPICILLIN/SULBACTAM <=2 SENSITIVE Sensitive     PIP/TAZO <=4 SENSITIVE Sensitive ug/mL    * >=100,000 COLONIES/mL ESCHERICHIA COLI  Culture, blood (Routine X 2) w Reflex to ID Panel     Status: Abnormal   Collection Time: 05/13/23 12:10 AM   Specimen: BLOOD  Result Value Ref Range Status   Specimen Description BLOOD LEFT ARM  Final   Special Requests   Final    BOTTLES DRAWN AEROBIC AND ANAEROBIC Blood Culture adequate volume   Culture  Setup Time   Final    GRAM NEGATIVE RODS AEROBIC BOTTLE ONLY CRITICAL RESULT CALLED TO, READ BACK BY AND VERIFIED WITH: PHARMD AClinton Sawyer [id number] @ 2204 FH    Culture (A)  Final    ESCHERICHIA COLI SUSCEPTIBILITIES PERFORMED ON PREVIOUS CULTURE WITHIN THE LAST 5 DAYS. Performed at [facility], [address] [place], [place] [address]    Report Status 05/15/2023 FINAL  Final  Culture, blood (Routine X 2) w Reflex to ID Panel     Status: Abnormal   Collection Time: 05/13/23  2:40 AM   Specimen: BLOOD  Result Value Ref Range Status   Specimen  Description BLOOD RIGHT ANTECUBITAL  Final   Special Requests   Final    BOTTLES DRAWN AEROBIC AND ANAEROBIC Blood Culture adequate volume   Culture  Setup Time   Final    GRAM NEGATIVE RODS AEROBIC BOTTLE ONLY CRITICAL RESULT CALLED TO, READ BACK BY AND VERIFIED WITH: Oda Kilts [id number] @ 2204 FH Performed at [facility], [address] [place], [place] [address]    Culture ESCHERICHIA COLI (A)  Final   Report Status 05/15/2023 FINAL  Final   Organism ID, Bacteria ESCHERICHIA COLI  Final   Organism ID, Bacteria ESCHERICHIA COLI  Final      Susceptibility   Escherichia coli - KIRBY BAUER*    CEFAZOLIN SENSITIVE Sensitive    Escherichia coli - MIC*    AMPICILLIN <=2 SENSITIVE Sensitive     CEFEPIME <=0.12 SENSITIVE Sensitive     CEFTAZIDIME <=1 SENSITIVE Sensitive     CEFTRIAXONE <=0.25 SENSITIVE Sensitive     CIPROFLOXACIN <=0.25 SENSITIVE Sensitive     GENTAMICIN <=1 SENSITIVE Sensitive     IMIPENEM <=0.25 SENSITIVE Sensitive     TRIMETH/SULFA <=20 SENSITIVE Sensitive     AMPICILLIN/SULBACTAM <=2 SENSITIVE Sensitive     PIP/TAZO <=4 SENSITIVE Sensitive ug/mL    * ESCHERICHIA COLI    ESCHERICHIA COLI  Blood Culture ID Panel (Reflexed)     Status: Abnormal   Collection Time: 05/13/23  2:40 AM  Result Value Ref Range Status   Enterococcus faecalis NOT DETECTED NOT DETECTED Final   Enterococcus Faecium NOT DETECTED NOT DETECTED Final   Listeria monocytogenes NOT DETECTED NOT DETECTED Final   Staphylococcus species NOT DETECTED NOT DETECTED Final   Staphylococcus aureus (BCID) NOT DETECTED NOT DETECTED Final   Staphylococcus epidermidis NOT DETECTED NOT DETECTED  Final   Staphylococcus lugdunensis NOT DETECTED NOT DETECTED Final   Streptococcus species NOT DETECTED NOT DETECTED Final   Streptococcus agalactiae NOT DETECTED NOT DETECTED Final   Streptococcus pneumoniae NOT DETECTED NOT DETECTED Final   Streptococcus pyogenes NOT DETECTED NOT DETECTED Final    A.calcoaceticus-baumannii NOT DETECTED NOT DETECTED Final   Bacteroides fragilis NOT DETECTED NOT DETECTED Final   Enterobacterales DETECTED (A) NOT DETECTED Final    Comment: Enterobacterales represent a large order of gram negative bacteria, not a single organism. CRITICAL RESULT CALLED TO, READ BACK BY AND VERIFIED WITH: PHARMD AClinton Sawyer [phone] @ 2204 FH    Enterobacter cloacae complex NOT DETECTED NOT DETECTED Final   Escherichia coli DETECTED (A) NOT DETECTED Final    Comment: CRITICAL RESULT CALLED TO, READ BACK BY AND VERIFIED WITH: PHARMD AClinton Sawyer [id number] @ 2204 FH    Klebsiella aerogenes NOT DETECTED NOT DETECTED Final   Klebsiella oxytoca NOT DETECTED NOT DETECTED Final   Klebsiella pneumoniae NOT DETECTED NOT DETECTED Final   Proteus species NOT DETECTED NOT DETECTED Final   Salmonella species NOT DETECTED NOT DETECTED Final   Serratia marcescens NOT DETECTED NOT DETECTED Final   Haemophilus influenzae NOT DETECTED NOT DETECTED Final   Neisseria meningitidis NOT DETECTED NOT DETECTED Final   Pseudomonas aeruginosa NOT DETECTED NOT DETECTED Final   Stenotrophomonas maltophilia NOT DETECTED NOT DETECTED Final   Candida albicans NOT DETECTED NOT DETECTED Final   Candida auris NOT DETECTED NOT DETECTED Final   Candida glabrata NOT DETECTED NOT DETECTED Final   Candida krusei NOT DETECTED NOT DETECTED Final   Candida parapsilosis NOT DETECTED NOT DETECTED Final   Candida tropicalis NOT DETECTED NOT DETECTED Final   Cryptococcus neoformans/gattii NOT DETECTED NOT DETECTED Final   CTX-M ESBL NOT DETECTED NOT DETECTED Final   Carbapenem resistance IMP NOT DETECTED NOT DETECTED Final   Carbapenem resistance KPC NOT DETECTED NOT DETECTED Final   Carbapenem resistance NDM NOT DETECTED NOT DETECTED Final   Carbapenem resist OXA 48 LIKE NOT DETECTED NOT DETECTED Final   Carbapenem resistance VIM NOT DETECTED NOT DETECTED Final    Comment: Performed at [facility],  [address], [place], [place] [address]    FURTHER DISCHARGE INSTRUCTIONS:  Get Medicines reviewed and adjusted: Please take all your medications with you for your next visit with your Primary MD  Laboratory/radiological data: Please request your Primary MD to go over all hospital tests and procedure/radiological results at the follow up, please ask your Primary MD to get all Hospital records sent to his/her office.  In some cases, they will be blood work, cultures and biopsy results pending at the time of your discharge. Please request that your primary care M.D. goes through all the records of your hospital data and follows up on these results.  Also Note the following: If you experience worsening of your admission symptoms, develop shortness of breath, life threatening emergency, suicidal or homicidal thoughts you must seek medical attention immediately by calling 911 or calling your MD immediately  if symptoms less severe.  You must read complete instructions/literature along with all the possible adverse reactions/side effects for all the Medicines you take and that have been prescribed to you. Take any new Medicines after you have completely understood and accpet all the possible adverse reactions/side effects.   Do not drive when taking Pain medications or sleeping medications (Benzodaizepines)  Do not take more than prescribed Pain, Sleep and Anxiety Medications. It is not advisable  to combine anxiety,sleep and pain medications without talking with your primary care practitioner  Special Instructions: If you have smoked or chewed Tobacco  in the last 2 yrs please stop smoking, stop any regular Alcohol  and or any Recreational drug use.  Wear Seat belts while driving.  Please note: You were cared for by a hospitalist during your hospital stay. Once you are discharged, your primary care physician will handle any further medical issues. Please note that NO REFILLS for any discharge  medications will be authorized once you are discharged, as it is imperative that you return to your primary care physician (or establish a relationship with a primary care physician if you do not have one) for your post hospital discharge needs so that they can reassess your need for medications and monitor your lab values.  Total Time spent coordinating discharge including counseling, education and face to face time equals greater than 30 minutes.  SignedJeoffrey Massed 05/16/2023 12:08 PM

## 2023-05-16 NOTE — Plan of Care (Signed)

## 2023-05-16 NOTE — Progress Notes (Signed)
Mobility Specialist Progress Note;   05/16/23 0920  Mobility  Activity Transferred from bed to chair  Level of Assistance Contact guard assist, steadying assist  Assistive Device Front wheel walker  Distance Ambulated (ft) 3 ft  Activity Response Tolerated well  Mobility Referral Yes  Mobility visit 1 Mobility  Mobility Specialist Start Time (ACUTE ONLY) 0920  Mobility Specialist Stop Time (ACUTE ONLY) 0930  Mobility Specialist Time Calculation (min) (ACUTE ONLY) 10 min   Pt agreeable to mobility. Required MinG assistance during transfer from bed to chair. VSS throughout and no c/o. Pt is very motivated. Pt left in chair with all needs met, alarm on.   Caesar Bookman Mobility Specialist Please contact via SecureChat or Rehab Office [phone]

## 2023-05-16 NOTE — TOC Transition Note (Signed)
Transition of Care [facility]) - Discharge Note   Patient Details  Name: Calvin Escobar MRN: [id number] Date of Birth: 04-19-1951  Transition of Care [facility]) CM/SW Contact:  Gordy Clement, RN Phone Number: 05/16/2023, 12:27 PM   Clinical Narrative:     Patient will dc to home today with Wife. Patient will receive Home Health from [facility] . A rolling walker will be delivered bedside prior to dc by [employer]. Wife is working today and will be here after 5:00 to transport patient home  AVS updated            Patient Goals and CMS Choice            Discharge Placement                       Discharge Plan and Services Additional resources added to the After Visit Summary for                                       Social Drivers of Health (SDOH) Interventions SDOH Screenings   Food Insecurity: No Food Insecurity (05/13/2023)  Housing: Low Risk  (05/13/2023)  Transportation Needs: No Transportation Needs (05/13/2023)  Utilities: Not At Risk (05/13/2023)  Depression (PHQ2-9): Low Risk  (03/07/2023)  Recent Concern: Depression (PHQ2-9) - High Risk (01/15/2023)  Financial Resource Strain: Low Risk  (10/03/2022)  Physical Activity: Inactive (05/04/2022)  Social Connections: Unknown (02/15/2023)   Received from [facility]  Stress: Stress Concern Present (11/30/2022)  Tobacco Use: Low Risk  (05/13/2023)     Readmission Risk Interventions     No data to display

## 2023-05-17 ENCOUNTER — Telehealth: Payer: Self-pay

## 2023-05-17 NOTE — Transitions of Care (Post Inpatient/ED Visit) (Signed)
05/17/2023  Name: Calvin Escobar MRN: [id number] DOB: 08-Jun-1950  Today's TOC FU Call Status: Today's TOC FU Call Status:: Successful TOC FU Call Completed TOC FU Call Complete Date: 05/17/23 Patient's Name and Date of Birth confirmed.  Transition Care Management Follow-up Telephone Call Date of Discharge: 05/16/23 Discharge Facility: [facility]) Type of Discharge: Inpatient Admission Primary Inpatient Discharge Diagnosis:: "AKI" How have you been since you were released from the hospital?: Better (Pt just waking up-A&O,denies any in or acute issues, appetite good-no issues with elimination, states he feels like he is "getting a little stornger-using walker") Any questions or concerns?: No  Items Reviewed: Did you receive and understand the discharge instructions provided?: Yes Medications obtained,verified, and reconciled?: Partial Review Completed Reason for Partial Mediation Review: pt just waking up-reports meds and med list downstairs reviewed new and changed meds wth pt-confirmed he has all meds in the home Any new allergies since your discharge?: No Dietary orders reviewed?: Yes Type of Diet Ordered:: low salt/heart healthy Do you have support at home?: Yes People in Home: spouse Name of Support/Comfort Primary Source: Mmariann  Medications Reviewed Today: Medications Reviewed Today     Reviewed by Charlyn Minerva, RN (Registered Nurse) on 05/17/23 at 1008  Med List Status: <None>   Medication Order Taking? Sig Documenting Provider Last Dose Status Informant  amoxicillin (AMOXIL) 500 MG capsule [id number]  Take 2 capsules (1,000 mg total) by mouth 3 (three) times daily for 10 days. Maretta Bees, MD  Active   ARIPiprazole (ABILIFY) 2 MG tablet [id number] No Take 2 mg by mouth daily. [provider] 05/12/2023 Active Spouse/Significant Other, Pharmacy Records  ascorbic acid (VITAMIN C) 500 MG tablet [id number] No Take 500 mg by mouth daily. [provider] 05/12/2023 Active Spouse/Significant Other, Pharmacy Records  busPIRone (BUSPAR) 10 MG tablet [id number] No Take 10 mg by mouth 3 (three) times daily. [provider] 05/12/2023 Active Spouse/Significant Other, Pharmacy Records           Med Note [place], [place] B   Tue Feb 05, 2023 10:13 AM)    calcium-vitamin D (OSCAL WITH D) 500-5 MG-MCG tablet [id number] No Take 1 tablet by mouth daily with breakfast. [provider] 05/12/2023 Active Spouse/Significant Other, Pharmacy Records  carvedilol (COREG) 12.5 MG tablet [id number] No Take 1 tablet (12.5 mg total) by mouth 2 (two) times daily with a meal. [name], MD 05/12/2023 Active Spouse/Significant Other, Pharmacy Records  divalproex (DEPAKOTE ER) 250 MG 24 hr tablet [id number] No Take 500 mg by mouth at bedtime. [provider] 05/12/2023 Active Spouse/Significant Other, Pharmacy Records           Med Note Monico Hoar Feb 25, 2023  3:44 PM)    folic acid (FOLVITE) 1 MG tablet [id number] No Take 1 tablet (1 mg total) by mouth daily. [name], MD 05/12/2023 Active Spouse/Significant Other, Pharmacy Records  gabapentin (NEURONTIN) 300 MG capsule [id number] No Take 300 mg by mouth at bedtime. [provider] 05/12/2023 Active Spouse/Significant Other, Pharmacy Records  LORazepam (ATIVAN) 1 MG tablet [id number] No Take one tablet by mouth in the morning and two tablets at bedtime [provider] 05/12/2023 Active Spouse/Significant Other, Pharmacy Records  losartan (COZAAR) 50 MG tablet [id number] No Take 1 tablet (50 mg total) by mouth daily. Replaces entresto, start after done with Vara Guardian, MD 05/12/2023 Active Spouse/Significant Other, Pharmacy Records  mirtazapine (REMERON) 30 MG tablet [id number] No Take 30 mg  by mouth at bedtime. [provider] 05/12/2023 Active Spouse/Significant Other, Pharmacy Records  Multiple Vitamin (MULTIVITAMIN) tablet [id number] No  Take 1 tablet by mouth daily. [provider] 05/12/2023 Active Spouse/Significant Other, Pharmacy Records  pravastatin (PRAVACHOL) 20 MG tablet [id number] No Take 1 tablet (20 mg total) by mouth at bedtime. [name], MD 05/12/2023 Active Spouse/Significant Other, Pharmacy Records  primidone (MYSOLINE) 50 MG tablet [id number] No Take 1 tablet (50 mg total) by mouth 4 (four) times daily as needed. For tremor, intention tremor.  Patient taking differently: Take 50 mg by mouth 4 (four) times daily as needed (for tremorts, intention tremor).   [name], MD 05/12/2023 Active Spouse/Significant Other, Pharmacy Records  tamsulosin (FLOMAX) 0.4 MG CAPS capsule [id number] No TAKE 1 CAPSULE BY MOUTH EVERY DAY [name], MD 05/12/2023 Active Spouse/Significant Other, Pharmacy Records  traZODone (DESYREL) 100 MG tablet [id number] No Take 300 mg by mouth at bedtime. [provider] 05/12/2023 pm Active Spouse/Significant Other, Pharmacy Records  venlafaxine XR (EFFEXOR-XR) 150 MG 24 hr capsule [id number] No Take 300 mg by mouth daily with breakfast. [provider] 05/12/2023 am Active Spouse/Significant Other, Pharmacy Records            Home Care and Equipment/Supplies: Were Home Health Services Ordered?: Yes Name of Home Health Agency:: [facility] Has Agency set up a time to come to your home?: No (Discussed to follow up with agency if no call from them within 48-72hrs post-discharge. Confirmed that they have agency contact info.) Any new equipment or medical supplies ordered?: Yes Name of Medical supply agency?: [employer]-rolling walker Were you able to get the equipment/medical supplies?: Yes Do you have any questions related to the use of the equipment/supplies?: No  Functional Questionnaire: Do you need assistance with bathing/showering or dressing?: No (pt voices wife assists with ADLs/IADLs) Do you need assistance with meal preparation?: Yes Do you need  assistance with eating?: No Do you have difficulty maintaining continence: No Do you need assistance with getting out of bed/getting out of a chair/moving?: No Do you have difficulty managing or taking your medications?: Yes  Follow up appointments reviewed: PCP Follow-up appointment confirmed?: Yes Date of PCP follow-up appointment?: 06/10/23 (pt had appt prior to admission-discused with pt-about possibly moving appt up to be seen by provider sooner following recent admission-he will talk with wife who handles appts) Follow-up Provider: Dr. Jon Billings Specialist [facility] Follow-up appointment confirmed?: No Reason Specialist Follow-Up Not Confirmed: Patient has Specialist Provider Number and will Call for Appointment (pt aware to make urology appt-states he will discuss with wife who makes his appts for him) Do you need transportation to your follow-up appointment?: No (pt confirms wife goes with him to appts) Do you understand care options if your condition(s) worsen?: Yes-patient verbalized understanding  SDOH Interventions Today    Flowsheet Row Most Recent Value  SDOH Interventions   Food Insecurity Interventions Intervention Not Indicated  Housing Interventions Intervention Not Indicated  Transportation Interventions Intervention Not Indicated  Utilities Interventions Intervention Not Indicated        Goals Addressed             This Visit's Progress    TOC Care Plan       Current Barriers:  Knowledge Deficits related to plan of care for management of chronic conditions   RNCM Clinical Goal(s):  Patient will work with the Care Management team over the next 30 days to address Transition of Care Barriers: mgmt of chronic conditions verbalize  basic understanding of  Dementia disease process and self health management plan as evidenced by adherence to plan of care take all medications exactly as prescribed and will call provider for medication related questions as evidenced by  completion of abx therapy, med compliance attend all scheduled medical appointments:   as evidenced by completion of PCP and specialist appt  through collaboration with RN Care manager, provider, and care team.   Interventions: Evaluation of current treatment plan related to  self management and patient's adherence to plan as established by provider    Dementia:  (Status:  New goal.)  Short Term Goal Evaluation of current treatment plan related to misuse of: Dementia with mood disturbances Reviewed med changes with pt-confirmed he has all meds-wife assisting with med mgmt Reviewed and discussed fall/safety measures in the home-pt has walker and is using it, he has bene set up with [facility] services Reviewed upcoming appts- pt is already active with SW and has upcoming appt Assessed for SDOH barriers Emotional Support Provided to patient/caregiver, Discussed importance of attendance to all provider appointments, Advised to contact provider for new or worsening symptoms, and changes in condition  Patient Goals/Self-Care Activities: Participate in Transition of Care Program/Attend TOC scheduled calls Take all medications as prescribed Attend all scheduled provider appointments Call provider office for new concerns or questions  Work with the social worker to address care coordination needs and will continue to work with the clinical team to address health care and disease management related needs Maintain safety in the home-report no falls Participate with [facility] services in the home  Follow Up Plan:  Telephone follow up appointment with care management team member scheduled for:  05/23/23-11am The patient has been provided with contact information for the care management team and has been advised to call with any health related questions or concerns.           Antionette Fairy, RN,BSN,CCM RN Care Manager Transitions of Care  [facility]  Direct Phone:  [phone] Toll Free: [phone] Fax: [phone]

## 2023-05-19 DIAGNOSIS — A4151 Sepsis due to Escherichia coli [E. coli]: Secondary | ICD-10-CM | POA: Diagnosis not present

## 2023-05-19 DIAGNOSIS — N136 Pyonephrosis: Secondary | ICD-10-CM | POA: Diagnosis not present

## 2023-05-19 DIAGNOSIS — I420 Dilated cardiomyopathy: Secondary | ICD-10-CM | POA: Diagnosis not present

## 2023-05-19 DIAGNOSIS — F0283 Dementia in other diseases classified elsewhere, unspecified severity, with mood disturbance: Secondary | ICD-10-CM | POA: Diagnosis not present

## 2023-05-19 DIAGNOSIS — N3000 Acute cystitis without hematuria: Secondary | ICD-10-CM | POA: Diagnosis not present

## 2023-05-19 DIAGNOSIS — I5022 Chronic systolic (congestive) heart failure: Secondary | ICD-10-CM | POA: Diagnosis not present

## 2023-05-19 DIAGNOSIS — F319 Bipolar disorder, unspecified: Secondary | ICD-10-CM | POA: Diagnosis not present

## 2023-05-19 DIAGNOSIS — G319 Degenerative disease of nervous system, unspecified: Secondary | ICD-10-CM | POA: Diagnosis not present

## 2023-05-19 DIAGNOSIS — I110 Hypertensive heart disease with heart failure: Secondary | ICD-10-CM | POA: Diagnosis not present

## 2023-05-21 DIAGNOSIS — A4151 Sepsis due to Escherichia coli [E. coli]: Secondary | ICD-10-CM | POA: Diagnosis not present

## 2023-05-21 DIAGNOSIS — F319 Bipolar disorder, unspecified: Secondary | ICD-10-CM | POA: Diagnosis not present

## 2023-05-21 DIAGNOSIS — F0283 Dementia in other diseases classified elsewhere, unspecified severity, with mood disturbance: Secondary | ICD-10-CM | POA: Diagnosis not present

## 2023-05-21 DIAGNOSIS — I420 Dilated cardiomyopathy: Secondary | ICD-10-CM | POA: Diagnosis not present

## 2023-05-21 DIAGNOSIS — I5022 Chronic systolic (congestive) heart failure: Secondary | ICD-10-CM | POA: Diagnosis not present

## 2023-05-21 DIAGNOSIS — N3000 Acute cystitis without hematuria: Secondary | ICD-10-CM | POA: Diagnosis not present

## 2023-05-21 DIAGNOSIS — I110 Hypertensive heart disease with heart failure: Secondary | ICD-10-CM | POA: Diagnosis not present

## 2023-05-21 DIAGNOSIS — N136 Pyonephrosis: Secondary | ICD-10-CM | POA: Diagnosis not present

## 2023-05-21 DIAGNOSIS — G319 Degenerative disease of nervous system, unspecified: Secondary | ICD-10-CM | POA: Diagnosis not present

## 2023-05-22 DIAGNOSIS — N130 Hydronephrosis with ureteropelvic junction obstruction: Secondary | ICD-10-CM | POA: Diagnosis not present

## 2023-05-22 DIAGNOSIS — N401 Enlarged prostate with lower urinary tract symptoms: Secondary | ICD-10-CM | POA: Diagnosis not present

## 2023-05-22 DIAGNOSIS — R3121 Asymptomatic microscopic hematuria: Secondary | ICD-10-CM | POA: Diagnosis not present

## 2023-05-22 DIAGNOSIS — N3020 Other chronic cystitis without hematuria: Secondary | ICD-10-CM | POA: Diagnosis not present

## 2023-05-22 DIAGNOSIS — N3941 Urge incontinence: Secondary | ICD-10-CM | POA: Diagnosis not present

## 2023-05-23 ENCOUNTER — Other Ambulatory Visit: Payer: Self-pay

## 2023-05-23 NOTE — Patient Instructions (Signed)
Visit Information  Thank you for taking time to visit with me today. Please don't hesitate to contact me if I can be of assistance to you.    Following is a copy of your care plan:   Goals Addressed             This Visit's Progress    TOC Care Plan       Current Barriers:  Knowledge Deficits related to plan of care for management of chronic conditions   RNCM Clinical Goal(s):  Patient will work with the Care Management team over the next 30 days to address Transition of Care Barriers: mgmt of chronic conditions verbalize basic understanding of  Dementia disease process and self health management plan as evidenced by adherence to plan of care take all medications exactly as prescribed and will call provider for medication related questions as evidenced by completion of abx therapy, med compliance attend all scheduled medical appointments:   as evidenced by completion of PCP and specialist appt  through collaboration with RN Care manager, provider, and care team.   Interventions: Evaluation of current treatment plan related to  self management and patient's adherence to plan as established by provider    Dementia:  (Status:   Patient feels he is doing well and does not need ongoing calls )  Short Term Goal Evaluation of current treatment plan related to misuse of: Dementia with mood disturbances Reviewed and discussed fall/safety measures in the home-pt has walker and is using it, feels he is getting stronger Reviewed upcoming appts- pt is already active with SW and has upcoming appt, PCP appt 06/10/23   Patient Goals/Self-Care Activities: Participate in Transition of Care Program/Attend TOC scheduled calls Take all medications as prescribed Attend all scheduled provider appointments Call provider office for new concerns or questions  Work with the social worker to address care coordination needs and will continue to work with the clinical team to address health care and disease  management related needs Maintain safety in the home-report no falls Participate with [facility] services in the home  Follow Up Plan:  The patient has been provided with contact information for the care management team and has been advised to call with any health related questions or concerns.  Patient has opted out of 30-day TOC program.         Patient verbalizes understanding of instructions and care plan provided today and agrees to view in MyChart. Active MyChart status and patient understanding of how to access instructions and care plan via MyChart confirmed with patient.     The patient has been provided with contact information for the care management team and has been advised to call with any health related questions or concerns.   Please call the care guide team at [phone] if you need to cancel or reschedule your appointment.   Please call the Suicide and Crisis Lifeline: 988 call the [place] National Suicide Prevention Lifeline: 410-689-8400 or TTY: 947-030-5477 TTY (959) 047-1803) to talk to a trained counselor if you are experiencing a Mental Health or Behavioral Health Crisis or need someone to talk to.   Antionette Fairy, RN,BSN,CCM RN Care Manager Transitions of Care  [place]-[facility]/Population Health  Direct Phone: [phone] Toll Free: [phone] Fax: [phone]

## 2023-05-23 NOTE — Telephone Encounter (Signed)
FYI  Copied from CRM [id number]. Topic: General - Other >> May 22, 2023  4:04 PM Corin V wrote: Reason for CRM: Clayton Lefort from [facility] called to advise that when he entered all patient medications into their system, it alerted for a Level 2 Severity Drug Interaction between the ARIPiprazole and the primidone and he is required to alert the provider.

## 2023-05-23 NOTE — Telephone Encounter (Signed)
Please see message below and advise.  Copied from CRM [id number]. Topic: Clinical - Home Health Verbal Orders >> May 22, 2023  3:42 PM Steele Sizer wrote: Caller/Agency: Clayton Lefort from [facility]  Callback Number: [phone] Service Requested: Physical Therapy and Skilled Nursing Frequency: Physical therapy  2 week 1, 1 week 1, 2 week 2, 1 week 3 and Skilled nursing 1 week 1  Any new concerns about the patient? Yes, The PT is not taking lorazepam 1mg 

## 2023-05-23 NOTE — Patient Outreach (Signed)
Care Management  Transitions of Care Program Transitions of Care Post-discharge week 2   05/23/2023 Name: Calvin Escobar MRN: [id number] DOB: 03-15-1951  Subjective: Calvin Escobar is a 72 y.o. year old male who is a primary care patient of [name], MD. The Care Management team Engaged with patient by telephone to assess and address transitions of care needs.   Consent to Services:  Patient does not feel like he needs continued calls  Assessment:   Patient voices he is doing "very well-feeling peppy"-"head feels good and clearer." He voices his "legs ae strong-working with therapy." He voices that therapist is coming today. He saw urologist on yesterday and got good report. He ah PCP appt in the upcoming weeks. Denies any RN CM needs or concerns at this time.         SDOH Interventions    Flowsheet Row Telephone from 05/17/2023 in [place] POPULATION HEALTH DEPARTMENT Office Visit from 03/07/2023 in [facility] [facility] [place] Patient Outreach from 11/30/2022 in [place] POPULATION HEALTH DEPARTMENT Care Coordination from 11/29/2022 in [facility] Care Coordination Office Visit from 11/21/2022 in [facility] [place] [facility] [place] Clinical Support from 05/04/2022 in [facility] [place] [facility] [place]  SDOH Interventions        Food Insecurity Interventions Intervention Not Indicated -- -- Intervention Not Indicated -- Intervention Not Indicated  Housing Interventions Intervention Not Indicated -- -- Intervention Not Indicated -- Intervention Not Indicated  Transportation Interventions Intervention Not Indicated -- -- Intervention Not Indicated -- Intervention Not Indicated  Utilities Interventions Intervention Not Indicated -- -- -- -- --  Depression Interventions/Treatment  -- Currently on Treatment, Counseling, Medication -- -- Counseling, Referral to Psychiatry, Medication Medication, Counseling  Financial  Strain Interventions -- -- -- -- -- Intervention Not Indicated  Physical Activity Interventions -- -- -- -- -- Intervention Not Indicated  Stress Interventions -- -- Other (Comment)  [patient is attending classes at [facility] and speaks with our socail worker regulalry about anxiety and stress.] -- -- Intervention Not Indicated  Social Connections Interventions -- -- -- -- -- Intervention Not Indicated        Goals Addressed             This Visit's Progress    TOC Care Plan       Current Barriers:  Knowledge Deficits related to plan of care for management of chronic conditions   RNCM Clinical Goal(s):  Patient will work with the Care Management team over the next 30 days to address Transition of Care Barriers: mgmt of chronic conditions verbalize basic understanding of  Dementia disease process and self health management plan as evidenced by adherence to plan of care take all medications exactly as prescribed and will call provider for medication related questions as evidenced by completion of abx therapy, med compliance attend all scheduled medical appointments:   as evidenced by completion of PCP and specialist appt  through collaboration with RN Care manager, provider, and care team.   Interventions: Evaluation of current treatment plan related to  self management and patient's adherence to plan as established by provider    Dementia:  (Status:   Patient feels he is doing well and does not need ongoing calls )  Short Term Goal Evaluation of current treatment plan related to misuse of: Dementia with mood disturbances Reviewed and discussed fall/safety measures in the home-pt has walker and is using it, feels he is  getting stronger Reviewed upcoming appts- pt is already active with SW and has upcoming appt, PCP appt 06/10/23   Patient Goals/Self-Care Activities: Participate in Transition of Care Program/Attend [facility] scheduled calls Take all medications as  prescribed Attend all scheduled provider appointments Call provider office for new concerns or questions  Work with the social worker to address care coordination needs and will continue to work with the clinical team to address health care and disease management related needs Maintain safety in the home-report no falls Participate with [facility] services in the home  Follow Up Plan:  The patient has been provided with contact information for the care management team and has been advised to call with any health related questions or concerns.  Patient has opted out of 30-day TOC program.         Plan: The patient has been provided with contact information for the care management team and has been advised to call with any health related questions or concerns.  No further follow up at this time.   Antionette Fairy, RN,BSN,CCM RN Care Manager Transitions of Care  [place]-[facility]BC[facility]/Population Health  Direct Phone: [phone] Toll Free: [phone] Fax: [phone]

## 2023-05-24 ENCOUNTER — Ambulatory Visit: Payer: Self-pay | Admitting: Licensed Clinical Social Worker

## 2023-05-24 DIAGNOSIS — N3000 Acute cystitis without hematuria: Secondary | ICD-10-CM | POA: Diagnosis not present

## 2023-05-24 DIAGNOSIS — F319 Bipolar disorder, unspecified: Secondary | ICD-10-CM | POA: Diagnosis not present

## 2023-05-24 DIAGNOSIS — G319 Degenerative disease of nervous system, unspecified: Secondary | ICD-10-CM | POA: Diagnosis not present

## 2023-05-24 DIAGNOSIS — I420 Dilated cardiomyopathy: Secondary | ICD-10-CM | POA: Diagnosis not present

## 2023-05-24 DIAGNOSIS — N136 Pyonephrosis: Secondary | ICD-10-CM | POA: Diagnosis not present

## 2023-05-24 DIAGNOSIS — A4151 Sepsis due to Escherichia coli [E. coli]: Secondary | ICD-10-CM | POA: Diagnosis not present

## 2023-05-24 DIAGNOSIS — I5022 Chronic systolic (congestive) heart failure: Secondary | ICD-10-CM | POA: Diagnosis not present

## 2023-05-24 DIAGNOSIS — F0283 Dementia in other diseases classified elsewhere, unspecified severity, with mood disturbance: Secondary | ICD-10-CM | POA: Diagnosis not present

## 2023-05-24 DIAGNOSIS — I110 Hypertensive heart disease with heart failure: Secondary | ICD-10-CM | POA: Diagnosis not present

## 2023-05-24 NOTE — Patient Instructions (Signed)
Visit Information  Thank you for taking time to visit with me today. Please don't hesitate to contact me if I can be of assistance to you.   Following are the goals we discussed today:   Goals Addressed             This Visit's Progress    Obtain Supportive Resources-Counselor   On track    Activities and task to complete in order to accomplish goals.   Keep all upcoming appointments discussed today Continue with compliance of taking medication prescribed by Doctor Implement healthy coping skills discussed to assist with management of symptoms Review counseling resources provided and/or discussed. F/up with Psychiatrist if you experience any adverse side effects Continue participation with community agencies           Our next appointment is by telephone on 01/17 at 11:30 AM  Please call the care guide team at [phone] if you need to cancel or reschedule your appointment.   If you are experiencing a Mental Health or Behavioral Health Crisis or need someone to talk to, please call the Suicide and Crisis Lifeline: 988 call 911   Patient verbalizes understanding of instructions and care plan provided today and agrees to view in MyChart. Active MyChart status and patient understanding of how to access instructions and care plan via MyChart confirmed with patient.     Jenel Lucks, MSW, LCSW [facility] Care Management [place]  [facility] [email or web address] Phone [phone] 4:49 PM

## 2023-05-24 NOTE — Patient Outreach (Signed)
  Care Coordination   Follow Up Visit Note   05/24/2023 Name: Calvin Escobar MRN: [id number] DOB: 11-13-1950  Calvin Escobar is a 72 y.o. year old male who sees [name], MD for primary care. I spoke with  [name] by phone today.  What matters to the patients health and wellness today?  Symptom Management    Goals Addressed             This Visit's Progress    Obtain Supportive Resources-Counselor   On track    Activities and task to complete in order to accomplish goals.   Keep all upcoming appointments discussed today Continue with compliance of taking medication prescribed by Doctor Implement healthy coping skills discussed to assist with management of symptoms Review counseling resources provided and/or discussed. F/up with Psychiatrist if you experience any adverse side effects Continue participation with community agencies           SDOH assessments and interventions completed:  No     Care Coordination Interventions:  Yes, provided   [facility]) Care Management is working in partnership with you to provide your patient with Disease Management, Transition of Care, Complex Care Management, and Wellness programs.            Follow up plan: Follow up call scheduled for 4-6 weeks    Encounter Outcome:  Patient Visit Completed   Jenel Lucks, MSW, LCSW [facility] Care Management [facility]  [facility] [email or web address] Phone [phone] 4:48 PM

## 2023-05-28 DIAGNOSIS — I110 Hypertensive heart disease with heart failure: Secondary | ICD-10-CM | POA: Diagnosis not present

## 2023-05-28 DIAGNOSIS — I420 Dilated cardiomyopathy: Secondary | ICD-10-CM | POA: Diagnosis not present

## 2023-05-28 DIAGNOSIS — F0283 Dementia in other diseases classified elsewhere, unspecified severity, with mood disturbance: Secondary | ICD-10-CM | POA: Diagnosis not present

## 2023-05-28 DIAGNOSIS — N3000 Acute cystitis without hematuria: Secondary | ICD-10-CM | POA: Diagnosis not present

## 2023-05-28 DIAGNOSIS — G319 Degenerative disease of nervous system, unspecified: Secondary | ICD-10-CM | POA: Diagnosis not present

## 2023-05-28 DIAGNOSIS — A4151 Sepsis due to Escherichia coli [E. coli]: Secondary | ICD-10-CM | POA: Diagnosis not present

## 2023-05-28 DIAGNOSIS — I5022 Chronic systolic (congestive) heart failure: Secondary | ICD-10-CM | POA: Diagnosis not present

## 2023-05-28 DIAGNOSIS — N136 Pyonephrosis: Secondary | ICD-10-CM | POA: Diagnosis not present

## 2023-05-28 DIAGNOSIS — F319 Bipolar disorder, unspecified: Secondary | ICD-10-CM | POA: Diagnosis not present

## 2023-05-30 ENCOUNTER — Telehealth: Payer: Self-pay | Admitting: Internal Medicine

## 2023-05-30 NOTE — Telephone Encounter (Signed)
Received forms today.

## 2023-05-30 NOTE — Telephone Encounter (Signed)
Received faxed  document Home Health Certificate (Order ID [id number] ), to be filled out by provider. Patient requested to send it back via Fax within 7-days. Document is located in providers tray at front office.Please advise .

## 2023-05-31 NOTE — Telephone Encounter (Signed)
Forms have been signed by Dr.Morrison and faxed back.

## 2023-06-02 ENCOUNTER — Emergency Department (HOSPITAL_COMMUNITY): Payer: Medicare HMO

## 2023-06-02 ENCOUNTER — Other Ambulatory Visit: Payer: Self-pay

## 2023-06-02 ENCOUNTER — Observation Stay (HOSPITAL_COMMUNITY): Payer: Medicare HMO

## 2023-06-02 ENCOUNTER — Inpatient Hospital Stay (HOSPITAL_COMMUNITY)
Admission: EM | Admit: 2023-06-02 | Discharge: 2023-06-11 | DRG: 871 | Disposition: A | Discharge status: Home Health | Payer: Medicare HMO | Attending: Internal Medicine | Admitting: Internal Medicine

## 2023-06-02 ENCOUNTER — Encounter (HOSPITAL_COMMUNITY): Payer: Self-pay

## 2023-06-02 DIAGNOSIS — N138 Other obstructive and reflux uropathy: Secondary | ICD-10-CM | POA: Diagnosis present

## 2023-06-02 DIAGNOSIS — N183 Chronic kidney disease, stage 3 unspecified: Secondary | ICD-10-CM | POA: Diagnosis present

## 2023-06-02 DIAGNOSIS — Y846 Urinary catheterization as the cause of abnormal reaction of the patient, or of later complication, without mention of misadventure at the time of the procedure: Secondary | ICD-10-CM | POA: Diagnosis not present

## 2023-06-02 DIAGNOSIS — Z8249 Family history of ischemic heart disease and other diseases of the circulatory system: Secondary | ICD-10-CM

## 2023-06-02 DIAGNOSIS — K5732 Diverticulitis of large intestine without perforation or abscess without bleeding: Secondary | ICD-10-CM | POA: Diagnosis present

## 2023-06-02 DIAGNOSIS — R0902 Hypoxemia: Secondary | ICD-10-CM | POA: Diagnosis not present

## 2023-06-02 DIAGNOSIS — R140 Abdominal distension (gaseous): Secondary | ICD-10-CM | POA: Diagnosis not present

## 2023-06-02 DIAGNOSIS — T83091A Other mechanical complication of indwelling urethral catheter, initial encounter: Secondary | ICD-10-CM | POA: Diagnosis not present

## 2023-06-02 DIAGNOSIS — I471 Supraventricular tachycardia, unspecified: Secondary | ICD-10-CM | POA: Diagnosis not present

## 2023-06-02 DIAGNOSIS — R6521 Severe sepsis with septic shock: Secondary | ICD-10-CM | POA: Diagnosis not present

## 2023-06-02 DIAGNOSIS — I5022 Chronic systolic (congestive) heart failure: Secondary | ICD-10-CM | POA: Diagnosis present

## 2023-06-02 DIAGNOSIS — A4152 Sepsis due to Pseudomonas: Secondary | ICD-10-CM | POA: Diagnosis not present

## 2023-06-02 DIAGNOSIS — N400 Enlarged prostate without lower urinary tract symptoms: Secondary | ICD-10-CM | POA: Diagnosis present

## 2023-06-02 DIAGNOSIS — E872 Acidosis, unspecified: Secondary | ICD-10-CM | POA: Diagnosis not present

## 2023-06-02 DIAGNOSIS — R0989 Other specified symptoms and signs involving the circulatory and respiratory systems: Secondary | ICD-10-CM | POA: Diagnosis not present

## 2023-06-02 DIAGNOSIS — F03A3 Unspecified dementia, mild, with mood disturbance: Secondary | ICD-10-CM | POA: Diagnosis present

## 2023-06-02 DIAGNOSIS — N401 Enlarged prostate with lower urinary tract symptoms: Secondary | ICD-10-CM | POA: Diagnosis present

## 2023-06-02 DIAGNOSIS — N1330 Unspecified hydronephrosis: Secondary | ICD-10-CM | POA: Diagnosis present

## 2023-06-02 DIAGNOSIS — A419 Sepsis, unspecified organism: Secondary | ICD-10-CM

## 2023-06-02 DIAGNOSIS — I4729 Other ventricular tachycardia: Secondary | ICD-10-CM | POA: Diagnosis not present

## 2023-06-02 DIAGNOSIS — J9601 Acute respiratory failure with hypoxia: Secondary | ICD-10-CM | POA: Diagnosis not present

## 2023-06-02 DIAGNOSIS — G9341 Metabolic encephalopathy: Secondary | ICD-10-CM | POA: Diagnosis not present

## 2023-06-02 DIAGNOSIS — W19XXXA Unspecified fall, initial encounter: Secondary | ICD-10-CM | POA: Diagnosis not present

## 2023-06-02 DIAGNOSIS — F0390 Unspecified dementia without behavioral disturbance: Secondary | ICD-10-CM | POA: Diagnosis present

## 2023-06-02 DIAGNOSIS — E66811 Obesity, class 1: Secondary | ICD-10-CM | POA: Diagnosis present

## 2023-06-02 DIAGNOSIS — I420 Dilated cardiomyopathy: Secondary | ICD-10-CM | POA: Diagnosis not present

## 2023-06-02 DIAGNOSIS — W06XXXA Fall from bed, initial encounter: Secondary | ICD-10-CM | POA: Diagnosis present

## 2023-06-02 DIAGNOSIS — Z808 Family history of malignant neoplasm of other organs or systems: Secondary | ICD-10-CM

## 2023-06-02 DIAGNOSIS — R6520 Severe sepsis without septic shock: Secondary | ICD-10-CM | POA: Diagnosis present

## 2023-06-02 DIAGNOSIS — Z1152 Encounter for screening for COVID-19: Secondary | ICD-10-CM | POA: Diagnosis not present

## 2023-06-02 DIAGNOSIS — R1310 Dysphagia, unspecified: Secondary | ICD-10-CM | POA: Diagnosis present

## 2023-06-02 DIAGNOSIS — N179 Acute kidney failure, unspecified: Secondary | ICD-10-CM | POA: Diagnosis present

## 2023-06-02 DIAGNOSIS — I6523 Occlusion and stenosis of bilateral carotid arteries: Secondary | ICD-10-CM | POA: Diagnosis not present

## 2023-06-02 DIAGNOSIS — N2889 Other specified disorders of kidney and ureter: Secondary | ICD-10-CM | POA: Diagnosis not present

## 2023-06-02 DIAGNOSIS — G319 Degenerative disease of nervous system, unspecified: Secondary | ICD-10-CM | POA: Diagnosis not present

## 2023-06-02 DIAGNOSIS — R509 Fever, unspecified: Secondary | ICD-10-CM | POA: Diagnosis not present

## 2023-06-02 DIAGNOSIS — B965 Pseudomonas (aeruginosa) (mallei) (pseudomallei) as the cause of diseases classified elsewhere: Secondary | ICD-10-CM | POA: Diagnosis not present

## 2023-06-02 DIAGNOSIS — D649 Anemia, unspecified: Secondary | ICD-10-CM | POA: Diagnosis present

## 2023-06-02 DIAGNOSIS — I9589 Other hypotension: Secondary | ICD-10-CM | POA: Diagnosis not present

## 2023-06-02 DIAGNOSIS — R933 Abnormal findings on diagnostic imaging of other parts of digestive tract: Secondary | ICD-10-CM | POA: Diagnosis not present

## 2023-06-02 DIAGNOSIS — D6959 Other secondary thrombocytopenia: Secondary | ICD-10-CM | POA: Diagnosis present

## 2023-06-02 DIAGNOSIS — E785 Hyperlipidemia, unspecified: Secondary | ICD-10-CM | POA: Diagnosis present

## 2023-06-02 DIAGNOSIS — S0990XA Unspecified injury of head, initial encounter: Secondary | ICD-10-CM | POA: Diagnosis not present

## 2023-06-02 DIAGNOSIS — I447 Left bundle-branch block, unspecified: Secondary | ICD-10-CM

## 2023-06-02 DIAGNOSIS — F3131 Bipolar disorder, current episode depressed, mild: Secondary | ICD-10-CM | POA: Diagnosis not present

## 2023-06-02 DIAGNOSIS — Z79899 Other long term (current) drug therapy: Secondary | ICD-10-CM

## 2023-06-02 DIAGNOSIS — F0394 Unspecified dementia, unspecified severity, with anxiety: Secondary | ICD-10-CM | POA: Diagnosis not present

## 2023-06-02 DIAGNOSIS — N321 Vesicointestinal fistula: Secondary | ICD-10-CM | POA: Insufficient documentation

## 2023-06-02 DIAGNOSIS — F1021 Alcohol dependence, in remission: Secondary | ICD-10-CM | POA: Diagnosis present

## 2023-06-02 DIAGNOSIS — Y92003 Bedroom of unspecified non-institutional (private) residence as the place of occurrence of the external cause: Secondary | ICD-10-CM | POA: Diagnosis not present

## 2023-06-02 DIAGNOSIS — N390 Urinary tract infection, site not specified: Secondary | ICD-10-CM

## 2023-06-02 DIAGNOSIS — D696 Thrombocytopenia, unspecified: Secondary | ICD-10-CM

## 2023-06-02 DIAGNOSIS — Z85828 Personal history of other malignant neoplasm of skin: Secondary | ICD-10-CM

## 2023-06-02 DIAGNOSIS — R000 Tachycardia, unspecified: Secondary | ICD-10-CM | POA: Diagnosis not present

## 2023-06-02 DIAGNOSIS — I110 Hypertensive heart disease with heart failure: Secondary | ICD-10-CM | POA: Diagnosis present

## 2023-06-02 DIAGNOSIS — N1831 Chronic kidney disease, stage 3a: Secondary | ICD-10-CM | POA: Diagnosis present

## 2023-06-02 DIAGNOSIS — R0689 Other abnormalities of breathing: Secondary | ICD-10-CM | POA: Diagnosis not present

## 2023-06-02 DIAGNOSIS — R251 Tremor, unspecified: Secondary | ICD-10-CM

## 2023-06-02 DIAGNOSIS — Z043 Encounter for examination and observation following other accident: Secondary | ICD-10-CM | POA: Diagnosis not present

## 2023-06-02 DIAGNOSIS — G4733 Obstructive sleep apnea (adult) (pediatric): Secondary | ICD-10-CM | POA: Diagnosis present

## 2023-06-02 DIAGNOSIS — Z6831 Body mass index (BMI) 31.0-31.9, adult: Secondary | ICD-10-CM

## 2023-06-02 DIAGNOSIS — I5020 Unspecified systolic (congestive) heart failure: Secondary | ICD-10-CM | POA: Diagnosis present

## 2023-06-02 DIAGNOSIS — I10 Essential (primary) hypertension: Secondary | ICD-10-CM

## 2023-06-02 DIAGNOSIS — F3130 Bipolar disorder, current episode depressed, mild or moderate severity, unspecified: Secondary | ICD-10-CM | POA: Diagnosis not present

## 2023-06-02 DIAGNOSIS — K5730 Diverticulosis of large intestine without perforation or abscess without bleeding: Secondary | ICD-10-CM | POA: Diagnosis not present

## 2023-06-02 DIAGNOSIS — R0602 Shortness of breath: Secondary | ICD-10-CM | POA: Diagnosis not present

## 2023-06-02 DIAGNOSIS — S199XXA Unspecified injury of neck, initial encounter: Secondary | ICD-10-CM | POA: Diagnosis not present

## 2023-06-02 DIAGNOSIS — T83031A Leakage of indwelling urethral catheter, initial encounter: Secondary | ICD-10-CM | POA: Diagnosis not present

## 2023-06-02 DIAGNOSIS — R296 Repeated falls: Secondary | ICD-10-CM | POA: Diagnosis present

## 2023-06-02 DIAGNOSIS — D638 Anemia in other chronic diseases classified elsewhere: Secondary | ICD-10-CM | POA: Diagnosis present

## 2023-06-02 HISTORY — DX: Sepsis, unspecified organism: A41.9

## 2023-06-02 HISTORY — DX: Thrombocytopenia, unspecified: D69.6

## 2023-06-02 HISTORY — DX: Acute respiratory failure with hypoxia: J96.01

## 2023-06-02 HISTORY — DX: Urinary tract infection, site not specified: N39.0

## 2023-06-02 HISTORY — DX: Vesicointestinal fistula: N32.1

## 2023-06-02 LAB — COMPREHENSIVE METABOLIC PANEL
ALT: 10 U/L (ref 0–44)
AST: 18 U/L (ref 15–41)
Albumin: 2.6 g/dL — ABNORMAL LOW (ref 3.5–5.0)
Alkaline Phosphatase: 51 U/L (ref 38–126)
Anion gap: 10 (ref 5–15)
BUN: 15 mg/dL (ref 8–23)
CO2: 24 mmol/L (ref 22–32)
Calcium: 8.3 mg/dL — ABNORMAL LOW (ref 8.9–10.3)
Chloride: 103 mmol/L (ref 98–111)
Creatinine, Ser: 1.25 mg/dL — ABNORMAL HIGH (ref 0.61–1.24)
GFR, Estimated: >60 mL/min (ref >60)
Glucose, Bld: 153 mg/dL — ABNORMAL HIGH (ref 70–99)
Potassium: 4 mmol/L (ref 3.5–5.1)
Sodium: 137 mmol/L (ref 135–145)
Total Bilirubin: 0.5 mg/dL (ref <1.2)
Total Protein: 6.5 g/dL (ref 6.5–8.1)

## 2023-06-02 LAB — I-STAT CHEM 8, ED
BUN: 16 mg/dL (ref 8–23)
BUN: 19 mg/dL (ref 8–23)
Calcium, Ion: 1.05 mmol/L — ABNORMAL LOW (ref 1.15–1.40)
Calcium, Ion: 1.13 mmol/L — ABNORMAL LOW (ref 1.15–1.40)
Chloride: 102 mmol/L (ref 98–111)
Chloride: 102 mmol/L (ref 98–111)
Creatinine, Ser: 1.3 mg/dL — ABNORMAL HIGH (ref 0.61–1.24)
Creatinine, Ser: 1.3 mg/dL — ABNORMAL HIGH (ref 0.61–1.24)
Glucose, Bld: 153 mg/dL — ABNORMAL HIGH (ref 70–99)
Glucose, Bld: 124 mg/dL — ABNORMAL HIGH (ref 70–99)
HCT: 26 % — ABNORMAL LOW (ref 39.0–52.0)
HCT: 24 % — ABNORMAL LOW (ref 39.0–52.0)
Hemoglobin: 8.8 g/dL — ABNORMAL LOW (ref 13.0–17.0)
Hemoglobin: 8.2 g/dL — ABNORMAL LOW (ref 13.0–17.0)
Potassium: 4.1 mmol/L (ref 3.5–5.1)
Potassium: 4 mmol/L (ref 3.5–5.1)
Sodium: 137 mmol/L (ref 135–145)
Sodium: 138 mmol/L (ref 135–145)
TCO2: 24 mmol/L (ref 22–32)
TCO2: 27 mmol/L (ref 22–32)

## 2023-06-02 LAB — CBC
HCT: 29.7 % — ABNORMAL LOW (ref 39.0–52.0)
Hemoglobin: 8.9 g/dL — ABNORMAL LOW (ref 13.0–17.0)
MCH: 27.6 pg (ref 26.0–34.0)
MCHC: 30 g/dL (ref 30.0–36.0)
MCV: 92.2 fL (ref 80.0–100.0)
Platelets: 111 10*3/uL — ABNORMAL LOW (ref 150–400)
RBC: 3.22 MIL/uL — ABNORMAL LOW (ref 4.22–5.81)
RDW: 17.9 % — ABNORMAL HIGH (ref 11.5–15.5)
WBC: 9.8 10*3/uL (ref 4.0–10.5)
nRBC: 0 % (ref 0.0–0.2)

## 2023-06-02 LAB — TYPE AND SCREEN
ABO/RH(D): O POS
Antibody Screen: NEGATIVE

## 2023-06-02 LAB — CBC WITH DIFFERENTIAL/PLATELET
Abs Immature Granulocytes: 0.07 10*3/uL (ref 0.00–0.07)
Basophils Absolute: 0.1 10*3/uL (ref 0.0–0.1)
Basophils Relative: 1 %
Eosinophils Absolute: 0 10*3/uL (ref 0.0–0.5)
Eosinophils Relative: 0 %
HCT: 28.5 % — ABNORMAL LOW (ref 39.0–52.0)
Hemoglobin: 8.8 g/dL — ABNORMAL LOW (ref 13.0–17.0)
Immature Granulocytes: 1 %
Lymphocytes Relative: 8 %
Lymphs Abs: 0.9 10*3/uL (ref 0.7–4.0)
MCH: 27.5 pg (ref 26.0–34.0)
MCHC: 30.9 g/dL (ref 30.0–36.0)
MCV: 89.1 fL (ref 80.0–100.0)
Monocytes Absolute: 1.4 10*3/uL — ABNORMAL HIGH (ref 0.1–1.0)
Monocytes Relative: 13 %
Neutro Abs: 8.2 10*3/uL — ABNORMAL HIGH (ref 1.7–7.7)
Neutrophils Relative %: 77 %
Platelets: 110 10*3/uL — ABNORMAL LOW (ref 150–400)
RBC: 3.2 MIL/uL — ABNORMAL LOW (ref 4.22–5.81)
RDW: 17.8 % — ABNORMAL HIGH (ref 11.5–15.5)
WBC: 10.6 10*3/uL — ABNORMAL HIGH (ref 4.0–10.5)
nRBC: 0 % (ref 0.0–0.2)

## 2023-06-02 LAB — CK: Total CK: 291 U/L (ref 49–397)

## 2023-06-02 LAB — I-STAT CG4 LACTIC ACID, ED
Lactic Acid, Venous: 0.9 mmol/L (ref 0.5–1.9)
Lactic Acid, Venous: 2.3 mmol/L (ref 0.5–1.9)

## 2023-06-02 LAB — URINALYSIS, W/ REFLEX TO CULTURE (INFECTION SUSPECTED)
Bilirubin Urine: NEGATIVE
Glucose, UA: NEGATIVE mg/dL
Ketones, ur: NEGATIVE mg/dL
Nitrite: POSITIVE — AB
Protein, ur: 100 mg/dL — AB
Specific Gravity, Urine: 1.013 (ref 1.005–1.030)
WBC, UA: >50 WBC/hpf (ref 0–5)
pH: 5 (ref 5.0–8.0)

## 2023-06-02 LAB — APTT: aPTT: 30 s (ref 24–36)

## 2023-06-02 LAB — PROCALCITONIN: Procalcitonin: 0.21 ng/mL

## 2023-06-02 LAB — RESP PANEL BY RT-PCR (RSV, FLU A&B, COVID)  RVPGX2
Influenza A by PCR: NEGATIVE
Influenza B by PCR: NEGATIVE
Resp Syncytial Virus by PCR: NEGATIVE
SARS Coronavirus 2 by RT PCR: NEGATIVE

## 2023-06-02 LAB — LACTIC ACID, PLASMA: Lactic Acid, Venous: 1.6 mmol/L (ref 0.5–1.9)

## 2023-06-02 LAB — TROPONIN I (HIGH SENSITIVITY)
Troponin I (High Sensitivity): 22 ng/L — ABNORMAL HIGH (ref <18)
Troponin I (High Sensitivity): 24 ng/L — ABNORMAL HIGH (ref <18)

## 2023-06-02 LAB — PROTIME-INR
INR: 1.2 (ref 0.8–1.2)
Prothrombin Time: 15 s (ref 11.4–15.2)

## 2023-06-02 MED ORDER — LACTATED RINGERS IV BOLUS
1000.0000 mL | Freq: Once | INTRAVENOUS | Status: AC
  Administered 2023-06-02: 1000 mL via INTRAVENOUS

## 2023-06-02 MED ORDER — ARIPIPRAZOLE 2 MG PO TABS
2.0000 mg | ORAL_TABLET | Freq: Every day | ORAL | Status: DC
  Administered 2023-06-02 – 2023-06-11 (×10): 2 mg via ORAL
  Filled 2023-06-02 (×10): qty 1

## 2023-06-02 MED ORDER — SODIUM CHLORIDE 0.9 % IV BOLUS
500.0000 mL | Freq: Once | INTRAVENOUS | Status: AC
Start: 2023-06-02 — End: 2023-06-02
  Administered 2023-06-02: 500 mL via INTRAVENOUS

## 2023-06-02 MED ORDER — TAMSULOSIN HCL 0.4 MG PO CAPS
0.4000 mg | ORAL_CAPSULE | Freq: Every day | ORAL | Status: DC
  Administered 2023-06-02 – 2023-06-05 (×4): 0.4 mg via ORAL
  Filled 2023-06-02 (×4): qty 1

## 2023-06-02 MED ORDER — ENOXAPARIN SODIUM 40 MG/0.4ML IJ SOSY
40.0000 mg | PREFILLED_SYRINGE | INTRAMUSCULAR | Status: DC
  Administered 2023-06-02: 40 mg via SUBCUTANEOUS
  Filled 2023-06-02: qty 0.4

## 2023-06-02 MED ORDER — ACETAMINOPHEN 325 MG PO TABS
650.0000 mg | ORAL_TABLET | Freq: Four times a day (QID) | ORAL | Status: DC | PRN
  Administered 2023-06-02: 650 mg via ORAL
  Filled 2023-06-02: qty 2

## 2023-06-02 MED ORDER — SODIUM CHLORIDE 0.9 % IV SOLN: INTRAVENOUS | Status: DC

## 2023-06-02 MED ORDER — SODIUM CHLORIDE 0.9 % IV SOLN
2.0000 g | Freq: Once | INTRAVENOUS | Status: AC
  Administered 2023-06-02: 2 g via INTRAVENOUS
  Filled 2023-06-02: qty 10

## 2023-06-02 MED ORDER — SODIUM CHLORIDE 0.9 % IV BOLUS
1000.0000 mL | Freq: Once | INTRAVENOUS | Status: AC
  Administered 2023-06-02: 1000 mL via INTRAVENOUS

## 2023-06-02 MED ORDER — PRAVASTATIN SODIUM 20 MG PO TABS
20.0000 mg | ORAL_TABLET | Freq: Every day | ORAL | Status: DC
  Administered 2023-06-02 – 2023-06-04 (×3): 20 mg via ORAL
  Filled 2023-06-02 (×3): qty 1

## 2023-06-02 MED ORDER — METRONIDAZOLE 500 MG/100ML IV SOLN
500.0000 mg | Freq: Two times a day (BID) | INTRAVENOUS | Status: DC
  Administered 2023-06-02: 500 mg via INTRAVENOUS
  Filled 2023-06-02: qty 100

## 2023-06-02 MED ORDER — MIRTAZAPINE 15 MG PO TABS
30.0000 mg | ORAL_TABLET | Freq: Every day | ORAL | Status: DC
  Administered 2023-06-02 – 2023-06-10 (×8): 30 mg via ORAL
  Filled 2023-06-02 (×8): qty 2

## 2023-06-02 MED ORDER — SODIUM CHLORIDE 0.9 % IV BOLUS: 1000.0000 mL | Freq: Once | INTRAVENOUS | Status: DC

## 2023-06-02 MED ORDER — LACTATED RINGERS IV SOLN: INTRAVENOUS | Status: AC

## 2023-06-02 MED ORDER — GABAPENTIN 300 MG PO CAPS
300.0000 mg | ORAL_CAPSULE | Freq: Every day | ORAL | Status: DC
  Administered 2023-06-02 – 2023-06-04 (×3): 300 mg via ORAL
  Filled 2023-06-02 (×3): qty 1

## 2023-06-02 MED ORDER — TRAZODONE HCL 50 MG PO TABS
300.0000 mg | ORAL_TABLET | Freq: Every day | ORAL | Status: DC
  Administered 2023-06-02 – 2023-06-04 (×3): 300 mg via ORAL
  Filled 2023-06-02 (×3): qty 3

## 2023-06-02 MED ORDER — ONDANSETRON HCL 4 MG PO TABS: 4.0000 mg | ORAL_TABLET | Freq: Four times a day (QID) | ORAL | Status: DC | PRN

## 2023-06-02 MED ORDER — FOLIC ACID 1 MG PO TABS
1.0000 mg | ORAL_TABLET | Freq: Every day | ORAL | Status: DC
  Administered 2023-06-02 – 2023-06-11 (×10): 1 mg via ORAL
  Filled 2023-06-02 (×10): qty 1

## 2023-06-02 MED ORDER — SODIUM CHLORIDE 0.9 % IV SOLN: 2.0000 g | INTRAVENOUS | Status: DC

## 2023-06-02 MED ORDER — BUSPIRONE HCL 10 MG PO TABS
10.0000 mg | ORAL_TABLET | Freq: Three times a day (TID) | ORAL | Status: DC
  Administered 2023-06-02 – 2023-06-11 (×25): 10 mg via ORAL
  Filled 2023-06-02: qty 2
  Filled 2023-06-02 (×2): qty 1
  Filled 2023-06-02 (×3): qty 2
  Filled 2023-06-02 (×3): qty 1
  Filled 2023-06-02: qty 2
  Filled 2023-06-02 (×3): qty 1
  Filled 2023-06-02 (×2): qty 2
  Filled 2023-06-02 (×5): qty 1
  Filled 2023-06-02 (×2): qty 2
  Filled 2023-06-02 (×3): qty 1

## 2023-06-02 MED ORDER — DIVALPROEX SODIUM ER 500 MG PO TB24
500.0000 mg | ORAL_TABLET | Freq: Every day | ORAL | Status: DC
  Administered 2023-06-02 – 2023-06-10 (×8): 500 mg via ORAL
  Filled 2023-06-02: qty 1
  Filled 2023-06-02: qty 2
  Filled 2023-06-02 (×6): qty 1

## 2023-06-02 MED ORDER — ONDANSETRON HCL 4 MG/2ML IJ SOLN: 4.0000 mg | Freq: Four times a day (QID) | INTRAMUSCULAR | Status: DC | PRN

## 2023-06-02 MED ORDER — PRIMIDONE 50 MG PO TABS
50.0000 mg | ORAL_TABLET | Freq: Two times a day (BID) | ORAL | Status: DC
  Administered 2023-06-02 – 2023-06-11 (×17): 50 mg via ORAL
  Filled 2023-06-02 (×19): qty 1

## 2023-06-02 MED ORDER — ACETAMINOPHEN 500 MG PO TABS
1000.0000 mg | ORAL_TABLET | Freq: Once | ORAL | Status: AC
  Administered 2023-06-02: 1000 mg via ORAL
  Filled 2023-06-02: qty 2

## 2023-06-02 MED ORDER — SODIUM CHLORIDE 0.9 % IV SOLN: 1.0000 g | Freq: Once | INTRAVENOUS | Status: DC

## 2023-06-02 MED ORDER — CARMEX CLASSIC LIP BALM EX OINT
TOPICAL_OINTMENT | CUTANEOUS | Status: DC | PRN
  Filled 2023-06-02: qty 10

## 2023-06-02 MED ORDER — IOHEXOL 350 MG/ML SOLN
75.0000 mL | Freq: Once | INTRAVENOUS | Status: AC | PRN
  Administered 2023-06-02: 75 mL via INTRAVENOUS

## 2023-06-02 MED ORDER — ORAL CARE MOUTH RINSE: 15.0000 mL | OROMUCOSAL | Status: DC | PRN

## 2023-06-02 MED ORDER — PIPERACILLIN-TAZOBACTAM 3.375 G IVPB
3.3750 g | Freq: Three times a day (TID) | INTRAVENOUS | Status: DC
  Administered 2023-06-02 – 2023-06-04 (×8): 3.375 g via INTRAVENOUS
  Filled 2023-06-02 (×8): qty 50

## 2023-06-02 MED ORDER — ACETAMINOPHEN 650 MG RE SUPP: 650.0000 mg | Freq: Four times a day (QID) | RECTAL | Status: DC | PRN

## 2023-06-02 MED ORDER — VENLAFAXINE HCL ER 150 MG PO CP24
300.0000 mg | ORAL_CAPSULE | Freq: Every day | ORAL | Status: DC
  Administered 2023-06-03 – 2023-06-11 (×9): 300 mg via ORAL
  Filled 2023-06-02 (×9): qty 2

## 2023-06-02 MED ORDER — SODIUM CHLORIDE 0.9 % IV SOLN
1.0000 g | INTRAVENOUS | Status: DC
  Administered 2023-06-02: 1 g via INTRAVENOUS
  Filled 2023-06-02: qty 10

## 2023-06-02 NOTE — Progress Notes (Signed)
Pt being followed by [facility] for Sepsis protocol. 

## 2023-06-02 NOTE — ED Provider Notes (Signed)
[place] EMERGENCY DEPARTMENT AT [facility] Provider Note   CSN: [id number] Arrival date & time: 06/02/23  0515     History  Chief Complaint  Patient presents with   Fall   Altered Mental Status    Calvin Escobar is a 72 y.o. male.  The history is provided by the patient, the EMS personnel and medical records.  Fall  Altered Mental Status Calvin Escobar is a 72 y.o. male who presents to the Emergency Department complaining of fall and altered mental status.  He presents to the emergency department by EMS from home for evaluation after falling out of bed.  This was an unwitnessed fall, potentially on the ground for up to 1 hour.  EMS reports tactile temperature, treated with acetaminophen prior to ED arrival.  They also report sats of 92%, 4 L nasal cannula were applied.  Patient complains of dysuria.  He is unsure what led to the fall and confused about recent events.  He denies any pain.  On record review he was discharged from the hospital on December 12 following admission for hypotension found to have E. coli bacteremia likely secondary to urinary source as well as AKI.  He was discharged on 2 weeks of antibiotics.     Home Medications Prior to Admission medications   Medication Sig Start Date End Date Taking? Authorizing Provider  ARIPiprazole (ABILIFY) 2 MG tablet Take 2 mg by mouth daily. 03/21/23   [provider]  ascorbic acid (VITAMIN C) 500 MG tablet Take 500 mg by mouth daily.    [provider]  busPIRone (BUSPAR) 10 MG tablet Take 10 mg by mouth 3 (three) times daily. 12/27/22   [provider]  calcium-vitamin D (OSCAL WITH D) 500-5 MG-MCG tablet Take 1 tablet by mouth daily with breakfast.    [provider]  carvedilol (COREG) 12.5 MG tablet Take 1 tablet (12.5 mg total) by mouth 2 (two) times daily with a meal. 12/19/22   [name], MD  divalproex (DEPAKOTE ER) 250 MG 24 hr tablet Take 500 mg by mouth at bedtime.     [provider]  folic acid (FOLVITE) 1 MG tablet Take 1 tablet (1 mg total) by mouth daily. 03/07/23 03/06/24  [name], MD  gabapentin (NEURONTIN) 300 MG capsule Take 300 mg by mouth at bedtime. 03/21/23   [provider]  LORazepam (ATIVAN) 1 MG tablet Take one tablet by mouth in the morning and two tablets at bedtime 01/10/23   [provider]  losartan (COZAAR) 50 MG tablet Take 1 tablet (50 mg total) by mouth daily. Replaces entresto, start after done with entresto 12/19/22   [name], MD  mirtazapine (REMERON) 30 MG tablet Take 30 mg by mouth at bedtime. 03/21/23   [provider]  Multiple Vitamin (MULTIVITAMIN) tablet Take 1 tablet by mouth daily.    [provider]  pravastatin (PRAVACHOL) 20 MG tablet Take 1 tablet (20 mg total) by mouth at bedtime. 12/19/22   [name], MD  primidone (MYSOLINE) 50 MG tablet Take 1 tablet (50 mg total) by mouth 4 (four) times daily as needed. For tremor, intention tremor. Patient taking differently: Take 50 mg by mouth 4 (four) times daily as needed (for tremorts, intention tremor). 05/08/23   [name], MD  tamsulosin (FLOMAX) 0.4 MG CAPS capsule TAKE 1 CAPSULE BY MOUTH EVERY DAY 11/12/22   [name], MD  traZODone (DESYREL) 100 MG tablet Take  300 mg by mouth at bedtime. 02/06/20   [provider]  venlafaxine XR (EFFEXOR-XR) 150 MG 24 hr capsule Take 300 mg by mouth daily with breakfast.    [provider]      Allergies    Patient has no known allergies.    Review of Systems   Review of Systems  All other systems reviewed and are negative.   Physical Exam Updated Vital Signs BP 103/70   Pulse 80   Temp (!) 101.9 F (38.8 C) (Rectal)   Resp (!) 25   Ht 5\' 5"  (1.651 m)   Wt 109.3 kg   SpO2 (!) 88%   BMI 40.10 kg/m  Physical Exam Vitals and nursing note reviewed.  Constitutional:      Appearance: He is well-developed.  HENT:     Head:  Normocephalic.     Comments: Abrasion to central forehead    Mouth/Throat:     Mouth: Mucous membranes are dry.  Cardiovascular:     Rate and Rhythm: Regular rhythm. Tachycardia present.     Heart sounds: No murmur heard. Pulmonary:     Effort: Pulmonary effort is normal. No respiratory distress.     Comments: Tachypnea, decreased air movement in the bases bilaterally Abdominal:     Palpations: Abdomen is soft.     Tenderness: There is no abdominal tenderness. There is no guarding or rebound.  Musculoskeletal:        General: No swelling or tenderness.  Skin:    General: Skin is warm and dry.  Neurological:     Mental Status: He is alert.     Comments: Oriented to person, place.  Disoriented to time and recent events.  5 out of 5 strength in all 4 extremities.  Confused.  Psychiatric:        Behavior: Behavior normal.     ED Results / Procedures / Treatments   Labs (all labs ordered are listed, but only abnormal results are displayed) Labs Reviewed  COMPREHENSIVE METABOLIC PANEL - Abnormal; Notable for the following components:      Result Value   Glucose, Bld 153 (*)    Creatinine, Ser 1.25 (*)    Calcium 8.3 (*)    Albumin 2.6 (*)    All other components within normal limits  CBC WITH DIFFERENTIAL/PLATELET - Abnormal; Notable for the following components:   WBC 10.6 (*)    RBC 3.20 (*)    Hemoglobin 8.8 (*)    HCT 28.5 (*)    RDW 17.8 (*)    Platelets 110 (*)    Neutro Abs 8.2 (*)    Monocytes Absolute 1.4 (*)    All other components within normal limits  I-STAT CHEM 8, ED - Abnormal; Notable for the following components:   Creatinine, Ser 1.30 (*)    Glucose, Bld 153 (*)    Calcium, Ion 1.05 (*)    Hemoglobin 8.8 (*)    HCT 26.0 (*)    All other components within normal limits  RESP PANEL BY RT-PCR (RSV, FLU A&B, COVID)  RVPGX2  CULTURE, BLOOD (ROUTINE X 2)  CULTURE, BLOOD (ROUTINE X 2)  PROTIME-INR  APTT  URINALYSIS, W/ REFLEX TO CULTURE (INFECTION  SUSPECTED)  I-STAT CG4 LACTIC ACID, ED    EKG EKG Interpretation Date/Time:  Sunday June 02 2023 05:37:15 EST Ventricular Rate:  94 PR Interval:  163 QRS Duration:  123 QT Interval:  352 QTC Calculation: 441 R Axis:   -33  Text Interpretation: Sinus  rhythm Left bundle branch block Confirmed by Tilden Fossa [phone]) on 06/02/2023 5:39:25 AM  Radiology DG Chest Port 1 View Result Date: 06/02/2023 CLINICAL DATA:  72 year old male with possible sepsis. Fall, altered mental status. EXAM: PORTABLE CHEST 1 VIEW COMPARISON:  Portable chest 05/12/2023 and earlier. FINDINGS: Portable AP semi upright view at 0546 hours. Stable low lung volumes. More rotated to the left now. Stable cardiac size and mediastinal contours. Stable lung volumes and ventilation. Calcified right midlung granuloma (no follow-up imaging recommended). No pneumothorax, pleural effusion, acute pulmonary opacity. Paucity of bowel gas the visible abdomen. No acute osseous abnormality identified. IMPRESSION: Chronically Low lung volumes. No acute cardiopulmonary abnormality or acute traumatic injury identified. Electronically Signed   By: Odessa Fleming M.D.   On: 06/02/2023 05:59    Procedures Procedures    Medications Ordered in ED Medications  sodium chloride 0.9 % bolus 500 mL (500 mLs Intravenous New Bag/Given 06/02/23 0604)  aztreonam (AZACTAM) 2 g in sodium chloride 0.9 % 100 mL IVPB (0 g Intravenous Stopped 06/02/23 6962)    ED Course/ Medical Decision Making/ A&P                                 Medical Decision Making Amount and/or Complexity of Data Reviewed Labs: ordered. Radiology: ordered.   Pt with hx/o CHF, dementia, recent admission for emphysematous cystitis with AKI here for evaluation after falling out of bed.  He is febrile, tachypneic, tachycardic on ED arrival.  He does have a new oxygen requirement of 2 L to maintain sats in the low 90s.  CBC with stable anemia.  Does have mild leukocytosis as  well as thrombocytopenia.  He was treated with empiric antibiotics for possible recurrent UTI as patient does have dysuria.  Chest x-ray without acute infiltrate.  Given his fall a CT head and C-spine were obtained.  He has no midline cervical spine tenderness on examination.  Patient care transferred pending UA, imaging and respiratory viral panel.        Final Clinical Impression(s) / ED Diagnoses Final diagnoses:  None    Rx / DC Orders ED Discharge Orders     None         Tilden Fossa, MD 06/02/23 [phone]

## 2023-06-02 NOTE — Consult Note (Shared)
Reason for Consult/Chief Complaint:  Colovesicular fistula Referring Provider: Orland Mustard, MD  HPI  Calvin Escobar is an 72 y.o. male with history of HFrEF, HTN, HLD, BPH, dementia, bipolar disorder, OSA on bipap, and tremor who presents to [facility] after fall, with AMS. Larey Seat out of bed, unwitnessed, potentially on ground for 1 hour. Patient was complaining of dysuria and was confused about events. 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 He was discharged with 2 weeks total of antibiotics.  Today, patient appears to be in urosepsis. 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  10 point review of systems is negative except as listed above in HPI.  Objective  Past Medical History: Past Medical History:  Diagnosis Date   Allergy seasonal   Ascending aorta dilatation (HCC)    38 mm by 2D echo 04/2021   Cancer (HCC) skin   DCM (dilated cardiomyopathy) (HCC)    nonischemic with normal coronary arteries at cath 06/2019.  EF 35 to 40% on echo 04/2021   Dementia (HCC)    Depression    Hyperlipidemia    Lithium toxicity 01/29/2016   OSA treated with BiPAP    SKIN CANCER, HX OF 05/10/2007   Facial left check and forehead and r shoulder  F/w derm   Urinary dribbling 03/14/2022    Past Surgical History: Past Surgical History:  Procedure Laterality Date   COLONOSCOPY     RIGHT/LEFT HEART CATH AND CORONARY ANGIOGRAPHY N/A 07/03/2019   Procedure: RIGHT/LEFT HEART CATH AND CORONARY ANGIOGRAPHY;  Surgeon: Dolores Patty, MD;  Location: [facility];  Service: Cardiovascular;  Laterality: N/A;    Family History:  Family History  Problem Relation Age of Onset   Hypertension Mother    Cancer Father    Bone cancer Sister     Social History:  reports that he has  never smoked. He has never used smokeless tobacco. He reports current alcohol use of about 14.0 standard drinks of alcohol per week. He reports that he does not use drugs.  Allergies: No Known Allergies  Medications: I have reviewed the patient's current medications.  Labs: I have personally reviewed all labs for the past 24h Results for orders placed or performed during the hospital encounter of 06/02/23 (from the past 48 hours)  Urinalysis, w/ Reflex to Culture (Infection Suspected) -Urine, Clean Catch     Status: Abnormal   Collection Time: 06/02/23  5:21 AM  Result Value Ref Range   Specimen Source URINE, CLEAN CATCH    Color, Urine YELLOW YELLOW   APPearance CLOUDY (A) CLEAR   Specific Gravity, Urine 1.013 1.005 - 1.030   pH 5.0 5.0 - 8.0   Glucose, UA NEGATIVE NEGATIVE mg/dL   Hgb urine dipstick MODERATE (A) NEGATIVE   Bilirubin Urine NEGATIVE NEGATIVE   Ketones, ur NEGATIVE NEGATIVE mg/dL   Protein, ur 161 (A) NEGATIVE mg/dL   Nitrite POSITIVE (A) NEGATIVE   Leukocytes,Ua LARGE (A) NEGATIVE   RBC / HPF 21-50 0 - 5 RBC/hpf   WBC, UA >50 0 - 5 WBC/hpf    Comment:        Reflex urine culture not performed if WBC <=10, OR if Squamous epithelial cells >5. If Squamous epithelial cells >5 suggest recollection.    Bacteria, UA FEW (A) NONE SEEN  Squamous Epithelial / HPF 0-5 0 - 5 /HPF   Mucus PRESENT     Comment: Performed at [facility], [address] [place], [place] [address]  I-Stat Lactic Acid, ED     Status: None   Collection Time: 06/02/23  5:22 AM  Result Value Ref Range   Lactic Acid, Venous 0.9 0.5 - 1.9 mmol/L  I-Stat Chem 8, ED     Status: Abnormal   Collection Time: 06/02/23  5:22 AM  Result Value Ref Range   Sodium 137 135 - 145 mmol/L   Potassium 4.1 3.5 - 5.1 mmol/L   Chloride 102 98 - 111 mmol/L   BUN 16 8 - 23 mg/dL   Creatinine, Ser 6.04 (H) 0.61 - 1.24 mg/dL   Glucose, Bld 540 (H) 70 - 99 mg/dL    Comment: Glucose reference range applies  only to samples taken after fasting for at least 8 hours.   Calcium, Ion 1.05 (L) 1.15 - 1.40 mmol/L   TCO2 24 22 - 32 mmol/L   Hemoglobin 8.8 (L) 13.0 - 17.0 g/dL   HCT 98.1 (L) 19.1 - 47.8 %  Comprehensive metabolic panel     Status: Abnormal   Collection Time: 06/02/23  5:24 AM  Result Value Ref Range   Sodium 137 135 - 145 mmol/L   Potassium 4.0 3.5 - 5.1 mmol/L   Chloride 103 98 - 111 mmol/L   CO2 24 22 - 32 mmol/L   Glucose, Bld 153 (H) 70 - 99 mg/dL    Comment: Glucose reference range applies only to samples taken after fasting for at least 8 hours.   BUN 15 8 - 23 mg/dL   Creatinine, Ser 2.95 (H) 0.61 - 1.24 mg/dL   Calcium 8.3 (L) 8.9 - 10.3 mg/dL   Total Protein 6.5 6.5 - 8.1 g/dL   Albumin 2.6 (L) 3.5 - 5.0 g/dL   AST 18 15 - 41 U/L   ALT 10 0 - 44 U/L   Alkaline Phosphatase 51 38 - 126 U/L   Total Bilirubin 0.5 <1.2 mg/dL   GFR, Estimated >62 >13 mL/min    Comment: (NOTE) Calculated using the CKD-EPI Creatinine Equation (2021)    Anion gap 10 5 - 15    Comment: Performed at [facility], [address], [place], [place] [address]  CBC with Differential     Status: Abnormal   Collection Time: 06/02/23  5:24 AM  Result Value Ref Range   WBC 10.6 (H) 4.0 - 10.5 K/uL   RBC 3.20 (L) 4.22 - 5.81 MIL/uL   Hemoglobin 8.8 (L) 13.0 - 17.0 g/dL   HCT 84.6 (L) 96.2 - 95.2 %   MCV 89.1 80.0 - 100.0 fL   MCH 27.5 26.0 - 34.0 pg   MCHC 30.9 30.0 - 36.0 g/dL   RDW 84.1 (H) 32.4 - 40.1 %   Platelets 110 (L) 150 - 400 K/uL   nRBC 0.0 0.0 - 0.2 %   Neutrophils Relative % 77 %   Neutro Abs 8.2 (H) 1.7 - 7.7 K/uL   Lymphocytes Relative 8 %   Lymphs Abs 0.9 0.7 - 4.0 K/uL   Monocytes Relative 13 %   Monocytes Absolute 1.4 (H) 0.1 - 1.0 K/uL   Eosinophils Relative 0 %   Eosinophils Absolute 0.0 0.0 - 0.5 K/uL   Basophils Relative 1 %   Basophils Absolute 0.1 0.0 - 0.1 K/uL   Immature Granulocytes 1 %   Abs Immature  Granulocytes 0.07 0.00 - 0.07 K/uL    Comment:  Performed at [facility], [address] [place], [place] [address]  Protime-INR     Status: None   Collection Time: 06/02/23  5:24 AM  Result Value Ref Range   Prothrombin Time 15.0 11.4 - 15.2 seconds   INR 1.2 0.8 - 1.2    Comment: (NOTE) INR goal varies based on device and disease states. Performed at [facility], [address] [place], [place] [address]   APTT     Status: None   Collection Time: 06/02/23  5:24 AM  Result Value Ref Range   aPTT 30 24 - 36 seconds    Comment: Performed at [facility], [address], [place], [place] [address]  Procalcitonin     Status: None   Collection Time: 06/02/23  5:24 AM  Result Value Ref Range   Procalcitonin 0.21 ng/mL    Comment:        Interpretation: PCT (Procalcitonin) <= 0.5 ng/mL: Systemic infection (sepsis) is not likely. Local bacterial infection is possible. (NOTE)       Sepsis PCT Algorithm           Lower Respiratory Tract                                      Infection PCT Algorithm    ----------------------------     ----------------------------         PCT < 0.25 ng/mL                PCT < 0.10 ng/mL          Strongly encourage             Strongly discourage   discontinuation of antibiotics    initiation of antibiotics    ----------------------------     -----------------------------       PCT 0.25 - 0.50 ng/mL            PCT 0.10 - 0.25 ng/mL               OR       >80% decrease in PCT            Discourage initiation of                                            antibiotics      Encourage discontinuation           of antibiotics    ----------------------------     -----------------------------         PCT >= 0.50 ng/mL              PCT 0.26 - 0.50 ng/mL               AND        <80% decrease in PCT             Encourage initiation of                                             antibiotics       Encourage continuation  of antibiotics    ----------------------------      -----------------------------        PCT >= 0.50 ng/mL                  PCT > 0.50 ng/mL               AND         increase in PCT                  Strongly encourage                                      initiation of antibiotics    Strongly encourage escalation           of antibiotics                                     -----------------------------                                           PCT <= 0.25 ng/mL                                                 OR                                        > 80% decrease in PCT                                      Discontinue / Do not initiate                                             antibiotics  Performed at [facility], [address] [place], [place] [address]   Resp panel by RT-PCR (RSV, Flu A&B, Covid) Anterior Nasal Swab     Status: None   Collection Time: 06/02/23  5:58 AM   Specimen: Anterior Nasal Swab  Result Value Ref Range   SARS Coronavirus 2 by RT PCR NEGATIVE NEGATIVE   Influenza A by PCR NEGATIVE NEGATIVE   Influenza B by PCR NEGATIVE NEGATIVE    Comment: (NOTE) The Xpert Xpress SARS-CoV-2/FLU/RSV plus assay is intended as an aid in the diagnosis of influenza from Nasopharyngeal swab specimens and should not be used as a sole basis for treatment. Nasal washings and aspirates are unacceptable for Xpert Xpress SARS-CoV-2/FLU/RSV testing.  Fact Sheet for Patients: BloggerCourse.com  Fact Sheet for Healthcare Providers: SeriousBroker.it  This test is not yet approved or cleared by the [place] FDA and has been authorized for detection and/or diagnosis of SARS-CoV-2 by FDA under an Emergency Use Authorization (EUA). This EUA will remain in effect (meaning this test can be used) for the duration of the COVID-19 declaration under  Section 564(b)(1) of the Act, 21 U.S.C. section 360bbb-3(b)(1), unless the authorization is terminated or revoked.     Resp  Syncytial Virus by PCR NEGATIVE NEGATIVE    Comment: (NOTE) Fact Sheet for Patients: BloggerCourse.com  Fact Sheet for Healthcare Providers: SeriousBroker.it  This test is not yet approved or cleared by the [place] FDA and has been authorized for detection and/or diagnosis of SARS-CoV-2 by FDA under an Emergency Use Authorization (EUA). This EUA will remain in effect (meaning this test can be used) for the duration of the COVID-19 declaration under Section 564(b)(1) of the Act, 21 U.S.C. section 360bbb-3(b)(1), unless the authorization is terminated or revoked.  Performed at [facility], [address] [place], [place] [address]   Troponin I (High Sensitivity)     Status: Abnormal   Collection Time: 06/02/23  9:28 AM  Result Value Ref Range   Troponin I (High Sensitivity) 22 (H) <18 ng/L    Comment: (NOTE) Elevated high sensitivity troponin I (hsTnI) values and significant  changes across serial measurements may suggest ACS but many other  chronic and acute conditions are known to elevate hsTnI results.  Refer to the "Links" section for chest pain algorithms and additional  guidance. Performed at [facility], [address] [place], [place] [address]   CK     Status: None   Collection Time: 06/02/23  9:28 AM  Result Value Ref Range   Total CK 291 49 - 397 U/L    Comment: Performed at [facility], [address] [place], [place] [address]  I-Stat Lactic Acid, ED     Status: Abnormal   Collection Time: 06/02/23  9:51 AM  Result Value Ref Range   Lactic Acid, Venous 2.3 (HH) 0.5 - 1.9 mmol/L   Comment NOTIFIED PHYSICIAN   I-stat chem 8, ed     Status: Abnormal   Collection Time: 06/02/23  9:56 AM  Result Value Ref Range   Sodium 138 135 - 145 mmol/L   Potassium 4.0 3.5 - 5.1 mmol/L   Chloride 102 98 - 111 mmol/L   BUN 19 8 - 23 mg/dL   Creatinine, Ser 9.14 (H) 0.61 - 1.24 mg/dL   Glucose, Bld  782 (H) 70 - 99 mg/dL    Comment: Glucose reference range applies only to samples taken after fasting for at least 8 hours.   Calcium, Ion 1.13 (L) 1.15 - 1.40 mmol/L   TCO2 27 22 - 32 mmol/L   Hemoglobin 8.2 (L) 13.0 - 17.0 g/dL   HCT 95.6 (L) 21.3 - 08.6 %    Imaging: I have personally reviewed and interpreted all imaging for the past 24h and agree with the radiologist's impression.  CT ABDOMEN PELVIS W WO CONTRAST Result Date: 06/02/2023 CLINICAL DATA:  72 year old male with history of dementia. Suspected urolithiasis. EXAM: CT ABDOMEN AND PELVIS WITHOUT AND WITH CONTRAST TECHNIQUE: Multidetector CT imaging of the abdomen and pelvis was performed following the standard protocol before and following the bolus administration of intravenous contrast. RADIATION DOSE REDUCTION: This exam was performed according to the departmental dose-optimization program which includes automated exposure control, adjustment of the mA and/or kV according to patient size and/or use of iterative reconstruction technique. CONTRAST:  75mL OMNIPAQUE IOHEXOL 350 MG/ML SOLN COMPARISON:  CT of the abdomen and pelvis 05/16/2023. FINDINGS: Lower chest: Calcified granuloma in the right middle lobe. Atherosclerotic calcifications in the left main, left anterior descending and right coronary arteries. Hepatobiliary: No definite suspicious  cystic or solid hepatic lesions are confidently identified on today's noncontrast CT examination. Unenhanced appearance of the gallbladder is unremarkable. Pancreas: No definite pancreatic mass or peripancreatic fluid collections or inflammatory changes are noted on today's noncontrast CT examination. Spleen: Unremarkable. Adrenals/Urinary Tract: There is a large amount of gas present in the nondependent aspect of the urinary bladder. There also appears to be some debris dependently in the urinary bladder, which also contains a small amount of gas (best appreciated on axial image 91 of series 3).  Superior aspect of the urinary bladder appears thickened and intimately associated with the adjacent sigmoid colon, centered around what appears to be a large diverticulum, best appreciated on coronal image 56 of series 10. Small amount of gas in the superior wall of the urinary bladder (coronal image 57 of series 10). Moderate right and mild left hydroureteronephrosis. Small amount of gas within the collecting systems of both kidneys (right greater than left). Unenhanced appearance of the kidneys and bilateral adrenal glands is otherwise unremarkable. Stomach/Bowel: Unenhanced appearance of the stomach is normal. No pathologic dilatation of small bowel or colon. 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 Normal appendix. Vascular/Lymphatic: Atherosclerosis in the abdominal aorta and pelvic vasculature. No lymphadenopathy noted in the abdomen or pelvis. Reproductive: Prostate gland and seminal vesicles are unremarkable in appearance. Other: No significant volume of ascites.  No pneumoperitoneum. Musculoskeletal: There are no aggressive appearing lytic or blastic lesions noted in the visualized portions of the skeleton. IMPRESSION: 1. 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 Urologic consultation is recommended for further clinical evaluation. 2. Aortic atherosclerosis. Electronically Signed   By: Trudie Reed M.D.   On: 06/02/2023 10:19   CT Head Wo Contrast Result Date: 06/02/2023 CLINICAL DATA:  The patient fell out of bed today sustaining head and neck trauma. EXAM: CT HEAD WITHOUT CONTRAST CT CERVICAL SPINE WITHOUT CONTRAST TECHNIQUE: Multidetector CT imaging of the head and cervical spine was performed  following the standard protocol without intravenous contrast. Multiplanar CT image reconstructions of the cervical spine were also generated. RADIATION DOSE REDUCTION: This exam was performed according to the departmental dose-optimization program which includes automated exposure control, adjustment of the mA and/or kV according to patient size and/or use of iterative reconstruction technique. COMPARISON:  Head CT and cervical spine CT recently both on 05/13/2023. FINDINGS: CT HEAD FINDINGS Brain: There is mild global atrophy, mild atrophic ventriculomegaly and small-vessel disease of the cerebral white matter, chronic senescent mineralization in the basal ganglia. No new abnormality concerning for a cortical based acute infarct, hemorrhage, mass or mass effect is seen and no midline shift. Basal cisterns are clear. There have been no appreciable interval changes. Vascular: No hyperdense vessel or unexpected calcification. Skull: Negative for fractures or focal lesions. Sinuses/Orbits: Patchy membrane thickening again noted in the ethmoid air cells with mild membrane disease in the maxillary sinuses. The frontal and sphenoid sinus, bilateral mastoid air cells, and middle ears are clear. The nasal septum is S shaped. Old lens extractions with otherwise negative orbits. Other: None. CT CERVICAL SPINE FINDINGS Alignment: Reversed cervical lordosis centered at C4-5, stable, with unchanged 3 mm grade 1 C4-5 anterolisthesis. No other listhesis is seen. Bone-on-bone anterior atlantodental joint space loss is also again noted, with osteophytes. Skull base  and vertebrae: There is mild osteopenia without evidence of fractures, primary bone lesions or focal pathologic process. There is chronic ankylosis across the C4-5 and C5-6 facet joints. There are anterior bridging osteophytes C4-7. Soft tissues and spinal canal: No prevertebral fluid or swelling. No visible canal hematoma. There are minimal calcifications at the carotid  bifurcations. No thyroid or laryngeal mass. Disc levels: There is chronic disc collapse C5-6, C6-7, C7-T1, normal disc heights above C5. There are dorsal disc osteophyte complexes C4-5 through C7-T1, associated with spinal canal stenosis and mild spondylotic cord compression from C4-5 through C6-7. The upper levels do not show significant soft tissue or bony encroachment on the spinal canal. There is multilevel facet joint and uncinate hypertrophy, with foraminal stenosis which is severe on the left at C2-3, bilaterally severe C3-4 through C5-6, bilaterally moderate to severe C6-7. Upper chest: Negative. Other: None. IMPRESSION: 1. No acute intracranial CT findings or depressed skull fractures. 2. Atrophy and small-vessel disease. 3. 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 4. Osteopenia and degenerative change without evidence of cervical fractures. 5. Reversed cervical lordosis with grade 1 C4-5 degenerative anterolisthesis. 6. Multilevel spinal canal and foraminal stenosis. 7. Carotid atherosclerosis. Electronically Signed   By: Almira Bar M.D.   On: 06/02/2023 07:03   CT Cervical Spine Wo Contrast Result Date: 06/02/2023 CLINICAL DATA:  The patient fell out of bed today sustaining head and neck trauma. EXAM: CT HEAD WITHOUT CONTRAST CT CERVICAL SPINE WITHOUT CONTRAST TECHNIQUE: Multidetector CT imaging of the head and cervical spine was performed following the standard protocol without intravenous contrast. Multiplanar CT image reconstructions of the cervical spine were also generated. RADIATION DOSE REDUCTION: This exam was performed according to the departmental dose-optimization program which includes automated exposure control, adjustment of the mA and/or kV according to patient size and/or use of iterative reconstruction technique. COMPARISON:  Head CT and cervical spine CT recently both on 05/13/2023. FINDINGS: CT HEAD FINDINGS Brain: There is mild global atrophy, mild atrophic ventriculomegaly  and small-vessel disease of the cerebral white matter, chronic senescent mineralization in the basal ganglia. No new abnormality concerning for a cortical based acute infarct, hemorrhage, mass or mass effect is seen and no midline shift. Basal cisterns are clear. There have been no appreciable interval changes. Vascular: No hyperdense vessel or unexpected calcification. Skull: Negative for fractures or focal lesions. Sinuses/Orbits: Patchy membrane thickening again noted in the ethmoid air cells with mild membrane disease in the maxillary sinuses. The frontal and sphenoid sinus, bilateral mastoid air cells, and middle ears are clear. The nasal septum is S shaped. Old lens extractions with otherwise negative orbits. Other: None. CT CERVICAL SPINE FINDINGS Alignment: Reversed cervical lordosis centered at C4-5, stable, with unchanged 3 mm grade 1 C4-5 anterolisthesis. No other listhesis is seen. Bone-on-bone anterior atlantodental joint space loss is also again noted, with osteophytes. Skull base and vertebrae: There is mild osteopenia without evidence of fractures, primary bone lesions or focal pathologic process. There is chronic ankylosis across the C4-5 and C5-6 facet joints. There are anterior bridging osteophytes C4-7. Soft tissues and spinal canal: No prevertebral fluid or swelling. No visible canal hematoma. There are minimal calcifications at the carotid bifurcations. No thyroid or laryngeal mass. Disc levels: There is chronic disc collapse C5-6, C6-7, C7-T1, normal disc heights above C5. There are dorsal disc osteophyte complexes C4-5 through C7-T1, associated with spinal canal stenosis and mild spondylotic cord compression from C4-5 through C6-7. The upper levels do not show significant soft tissue or bony  encroachment on the spinal canal. There is multilevel facet joint and uncinate hypertrophy, with foraminal stenosis which is severe on the left at C2-3, bilaterally severe C3-4 through C5-6, bilaterally  moderate to severe C6-7. Upper chest: Negative. Other: None. IMPRESSION: 1. No acute intracranial CT findings or depressed skull fractures. 2. Atrophy and small-vessel disease. 3. 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 4. Osteopenia and degenerative change without evidence of cervical fractures. 5. Reversed cervical lordosis with grade 1 C4-5 degenerative anterolisthesis. 6. Multilevel spinal canal and foraminal stenosis. 7. Carotid atherosclerosis. Electronically Signed   By: Almira Bar M.D.   On: 06/02/2023 07:03   DG Chest Port 1 View Result Date: 06/02/2023 CLINICAL DATA:  72 year old male with possible sepsis. Fall, altered mental status. EXAM: PORTABLE CHEST 1 VIEW COMPARISON:  Portable chest 05/12/2023 and earlier. FINDINGS: Portable AP semi upright view at 0546 hours. Stable low lung volumes. More rotated to the left now. Stable cardiac size and mediastinal contours. Stable lung volumes and ventilation. Calcified right midlung granuloma (no follow-up imaging recommended). No pneumothorax, pleural effusion, acute pulmonary opacity. Paucity of bowel gas the visible abdomen. No acute osseous abnormality identified. IMPRESSION: Chronically Low lung volumes. No acute cardiopulmonary abnormality or acute traumatic injury identified. Electronically Signed   By: Odessa Fleming M.D.   On: 06/02/2023 05:59     Physical Exam Blood pressure (!) 159/147, pulse 99, temperature 99.1 F (37.3 C), temperature source Axillary, resp. rate (!) 22, height 5\' 5"  (1.651 m), weight 109.3 kg, SpO2 98%. Constitutional: well-developed, well-nourished*** HEENT: pupils equal, round, reactive to light, moist conjunctiva, hearing {Blank single:19197::"intact","diminished","unable to be assessed"} Oropharynx: mucous membranes moist CV: Regular rate and rhythm***, ***tensive Chest: equal chest rise bilaterally {Blank single:19197::"normal","absent","labored"} respiratory effort on *** Abdomen: soft, nontender,  nondistended Back: *** Rectal: {Blank single:19197::"good tone, no blood","deferred"} Extremities: moves all extremities, *** peripheral edema Skin: warm, dry, no rashes Psych: {Blank single:19197::"normal memory, normal mood/affect","unable to assess"}  Neuro: No focal neurologic deficits, A&Ox3    Assessment: Calvin Escobar is an 72 y.o. male who presents to *** on *** {vbpatientpresentation:31666}  Plan: - *** - FEN - {diet:29922} - DVT - SCDs, {Blank single:19197::"LMWH","LMWH 40BID","SQH","hold chemical ppx due to bleeding concerns"} - Dispo - {Blank single:19197::"ICU","4NP","med-surg","***"}   I reviewed {Reviewed data:26882::"last 24 h vitals and pain scores","last 48 h intake and output","last 24 h labs and trends","last 24 h imaging results"}.  This care required {MDM levels:[id number]} level of medical decision making.   I spent a total of *** minutes in both face-to-face and non-face-to-face activities, excluding procedures performed, for this visit on the date of this encounter. I personally reviewed all labs and imaging and personally interpreted any imaging.

## 2023-06-02 NOTE — Plan of Care (Signed)
PT recent admit. Infection still apparent, and patient is growing increasingly confused and agitated

## 2023-06-02 NOTE — ED Provider Notes (Signed)
Received patient in turnover from Dr. Madilyn Hook.  Please see their note for further details of Hx, PE.  Briefly patient is a 72 y.o. male with a Fall and Altered Mental Status .  Patient had reportedly fallen was recently in the hospital for a urinary tract infection.  Found to be febrile and hypoxic here.  Plan to admit post UA and CT reads.  I was able to get in touch with the family this morning.  They tell me that he has been progressively confused and shaky over the past couple days.  He did recently increase medication to help with tremors.  She did not notice any fevers cough urinary symptoms abdominal pain.  He had fallen a few times over the past couple days and was a bit too weak to get up. CT of the head and C-spine without acute pathology.  UA is concerning for urinary tract infection.  Will discuss with medicine.    Melene Plan, DO 06/02/23 1513

## 2023-06-02 NOTE — Assessment & Plan Note (Signed)
Continue pravachol.  

## 2023-06-02 NOTE — Assessment & Plan Note (Signed)
-  appears new -check troponin -recent echo -repeat EKG in AM

## 2023-06-02 NOTE — Assessment & Plan Note (Signed)
In setting of sepsis Will monitor, hold VTE chemoprophylaxis for now with low platelets + frank bleeding from meatus

## 2023-06-02 NOTE — Assessment & Plan Note (Signed)
Noted to have oxygen to 88% on room air. Unsure if secondary to his OSA as supposed to wear bipap at night -no acute finding on CXR -no s/sx of volume overload/CHF exacerbation  -not tachycardic with no leg swelling to suggest PE -treat OSA, wean as tolerated

## 2023-06-02 NOTE — Assessment & Plan Note (Signed)
Baseline 1.1-1.2 Stable, continue to monitor

## 2023-06-02 NOTE — Assessment & Plan Note (Signed)
Recent echo 12/24: normal/low EF of 50-55%. Mild LVH. Normal diastolic function.  Watch volume with IVF resus Strict I/O Holding meds with septic shock

## 2023-06-02 NOTE — Consult Note (Signed)
Calvin Escobar  Apr 21, 1951 [id number]  CARE TEAM:  PCP: [name], MD  Outpatient Care Team: Patient Care Team: [name], MD as PCP - General (Internal Medicine) Quintella Reichert, MD as PCP - Cardiology (Cardiology) Nita Sells, MD (Dermatology) [facility], [facility] (Behavioral Health) Bridgett Larsson, LCSW as VBCI Care Management (Licensed Clinical Social Worker) [name], Leisure centre manager (Psychiatry)  Inpatient Treatment Team: Treatment Team:  Orland Mustard, MD Magnus Ivan, MD [place], Danford Bad., MD [facility], Md, MD Arlean Hopping, MD   This patient is a 72 y.o.male who presents today for surgical evaluation at the request of Orland Mustard, MD.   Chief complaint / Reason for evaluation: Abnormal CAT scan.  Possible colovesical fistula  Calvin Escobar is an 72 y.o. male with history of HFrEF (recovered), HTN, HLD, BPH, dementia, bipolar disorder, OSA on bipap, and tremor.  Patient had fall 05/12/2023 & went to [facility] after fall, with AMS. Larey Seat out of bed, unwitnessed, potentially on ground for 1 hour. Patient was complaining of dysuria and was confused about events.  Found to have E. coli bacteremia and CAT scan noted bladder wall thickening and air in the bladder.  It was read as "emphysematous cystitis" He was admitted and improved in 4 days.  He was discharged with 2 weeks total of antibiotics.   Apparently home patient felt more confused and began to have fevers.  Falling again.  Got to the point of being too weak to get up.  Concern with return to ER.  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  Has been followed by [facility] gastroenterology in the past.  Last colonoscopy 2012 without any major polyps Dr. Dickie La since retired.  Saw Dr. Janne Napoleon in 2022.  Had some mild foregut issues.  Given his deconditioned  state Cologuard recommended set of 10-year follow-up colonoscopy.  That was negative 04/23/2021   Patient was at outside emergency department but transferred [facility] due to bed availability.  Nursing in room.  Patient feeling tired but denies any abdominal pain.  Assessment  Calvin Escobar  72 y.o. 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    Plan:  Discussed with Dr. Artis Flock.  Patient is improved with fluid resuscitation already.  IV antibiotics.  I would favor piperacillin/tazobactam for complex diverticulitis protocol.  On CT scan from prior admission earlier this month, it seems consistent with sigmoid diverticulitis with colovesical fistula pneumaturia and bladder wall thickening.  Urine C&S w E. coli from last hospitalization was not multidrug-resistant so should be appropriate.  Given his worsening urosepsis I would put him on bowel rest for now and regroup like a complex diverticulitis attack that this is.    Urology can help evaluate.  Do know know if they wish to do cystoscopy  to rule out other concerns.  Standard of care is to consider sigmoid colectomy and takedown of the colovesical fistula as the source of the fistula is most likely diverticulitis.  Ideally if this urosepsis can be controlled and contained he can be transition to outpatient workup and follow-up.    Would require colonoscopy prior to surgery to make sure there is no  malignancy or other concerns.  Last colonoscopy 2012 with sigmoid diverticular disease  Given his multiple medical problems, I definitely would want medical and cardiac clearance to see if he could actually tolerate an operation.  With his progressive urosepsis on antibiotics, I do not think he can avoid an operation to do this but the less morbid thing would be fecal diversion.  Not ideal to give him colostomy if he can tolerate a more definitive operation.  Patient recently had echocardiogram that seems pretty normal overall but defer to Dr. Eden Emms and his group at [facility] cardiology   If patient rapidly declines, may require diverting colostomy.  Usually unlikely but we will see.  -VTE prophylaxis- SCDs, etc  -mobilize as tolerated to help recovery  Surgery will help follow. I reviewed nursing notes, ED provider notes, last 24 h vitals and pain scores, last 48 h intake and output, last 24 h labs and trends, and last 24 h imaging results. I have reviewed this patient's available data, including medical history, events of note, test results, etc as part of my evaluation.  A significant portion of that time was spent in counseling.  Care during the described time interval was provided by me.  This care required moderate level of medical decision making.  06/02/2023  Ardeth Sportsman, MD, FACS, MASCRS Esophageal, Gastrointestinal & Colorectal Surgery Robotic and Minimally Invasive Surgery  [facility] [address] [place], [place] [address] [phone] Fax [phone] Main  CONTACT INFORMATION: Weekday (9AM-5PM): Call [facility] main office at [phone] Weeknight (5PM-9AM) or Weekend/Holiday: Check EPIC "Web Links" tab & use "AMION" (password " [email or web address]") for General Surgery [facility] coverage  Please, DO NOT use SecureChat  (it is not reliable communication to reach operating surgeons & will lead to a delay in care).   Epic staff  messaging available for outptient concerns needing 1-2 business day response.      06/02/2023      Past Medical History:  Diagnosis Date   Allergy seasonal   Ascending aorta dilatation (HCC)    38 mm by 2D echo 04/2021   Cancer (HCC) skin   DCM (dilated cardiomyopathy) (HCC)    nonischemic with normal coronary arteries at cath 06/2019.  EF 35 to 40% on echo 04/2021   Dementia (HCC)    Depression    Hyperlipidemia    Lithium toxicity 01/29/2016   OSA treated with BiPAP    SKIN CANCER, HX OF 05/10/2007   Facial left check and forehead and r shoulder  F/w derm   Urinary dribbling 03/14/2022    Past Surgical History:  Procedure Laterality Date   COLONOSCOPY     RIGHT/LEFT HEART CATH AND CORONARY ANGIOGRAPHY N/A 07/03/2019   Procedure: RIGHT/LEFT HEART CATH AND CORONARY ANGIOGRAPHY;  Surgeon: Dolores Patty, MD;  Location: [facility] INVASIVE CV LAB;  Service: Cardiovascular;  Laterality: N/A;    Social History   Socioeconomic History   Marital status: Married    Spouse name: Not on file   Number of children: Not on file   Years of education: Not  on file   Highest education level: Not on file  Occupational History   Not on file  Tobacco Use   Smoking status: Never   Smokeless tobacco: Never  Vaping Use   Vaping status: Never Used  Substance and Sexual Activity   Alcohol use: Yes    Alcohol/week: 14.0 standard drinks of alcohol    Types: 14 Cans of beer per week   Drug use: No   Sexual activity: Not Currently  Other Topics Concern   Not on file  Social History Narrative   Not on file   Social Drivers of Health   Financial Resource Strain: Low Risk  (10/03/2022)   Overall Financial Resource Strain (CARDIA)    Difficulty of Paying Living Expenses: Not hard at all  Food Insecurity: No Food Insecurity (05/17/2023)   Hunger Vital Sign    Worried About Running Out of Food in the Last Year: Never true    Ran Out of Food in the Last Year: Never true  Transportation  Needs: No Transportation Needs (05/17/2023)   PRAPARE - Administrator, Civil Service (Medical): No    Lack of Transportation (Non-Medical): No  Physical Activity: Inactive (05/04/2022)   Exercise Vital Sign    Days of Exercise per Week: 0 days    Minutes of Exercise per Session: 0 min  Stress: Stress Concern Present (11/30/2022)   [employer] of Occupational Health - Occupational Stress Questionnaire    Feeling of Stress : Rather much  Social Connections: Unknown (02/15/2023)   Received from [facility]   Social Network    Social Network: Not on file  Intimate Partner Violence: Not At Risk (05/17/2023)   Humiliation, Afraid, Rape, and Kick questionnaire    Fear of Current or Ex-Partner: No    Emotionally Abused: No    Physically Abused: No    Sexually Abused: No    Family History  Problem Relation Age of Onset   Hypertension Mother    Cancer Father    Bone cancer Sister     Current Facility-Administered Medications  Medication Dose Route Frequency Provider Last Rate Last Admin   acetaminophen (TYLENOL) tablet 650 mg  650 mg Oral Q6H PRN Orland Mustard, MD       Or   acetaminophen (TYLENOL) suppository 650 mg  650 mg Rectal Q6H PRN Orland Mustard, MD       [START ON 06/03/2023] cefTRIAXone (ROCEPHIN) 2 g in sodium chloride 0.9 % 100 mL IVPB  2 g Intravenous Q24H Orland Mustard, MD       lactated ringers infusion   Intravenous Continuous Orland Mustard, MD 100 mL/hr at 06/02/23 1059 New Bag at 06/02/23 1059   metroNIDAZOLE (FLAGYL) IVPB 500 mg  500 mg Intravenous Q12H Orland Mustard, MD 100 mL/hr at 06/02/23 1233 500 mg at 06/02/23 1233   ondansetron (ZOFRAN) tablet 4 mg  4 mg Oral Q6H PRN Orland Mustard, MD       Or   ondansetron [facility]) injection 4 mg  4 mg Intravenous Q6H PRN Orland Mustard, MD         No Known Allergies  ROS:   All other systems reviewed & are negative except per HPI or as noted below: Constitutional: Some fevers.  Chills and sweats  as well.Eyes:  No vision changes, No discharge HENT:  No sore throats, nasal drainage Lymph: No neck swelling, No bruising easily Pulmonary:  No cough, productive sputum CV: No orthopnea, PND  Patient walks 1 block gradually.  Need some help.  No exertional chest/neck/shoulder/arm pain.  GI: Colonoscopy 2012 with diverticulosis only.  Cologuard negative -2022.  Known heartburn/GERD.  No personal nor family history of GI/colon cancer, inflammatory bowel disease, irritable bowel syndrome, allergy such as Celiac Sprue, dietary/dairy problems, colitis, ulcers nor gastritis.  No recent sick contacts/gastroenteritis.  No travel outside the country.  No changes in diet.  Renal: No UTIs, No hematuria Genital:  No drainage, bleeding, masses Musculoskeletal: No severe joint pain.  Good ROM major joints Skin:  No sores or lesions Heme/Lymph:  No easy bleeding.  No swollen lymph nodes Neuro: No focal deficits.  No numbness Psych.  Baseline dementia with some confusion.  More agitated and confused  BP (!) 159/147   Pulse 99   Temp 99.1 F (37.3 C) (Axillary)   Resp (!) 22   Ht 5\' 10"  (1.778 m)   Wt 109.3 kg   SpO2 98%   BMI 34.58 kg/m   Physical Exam:  Constitutional: Not cachectic.  Hygeine adequate.  Vitals signs as above.  Hygiene fair.  Well-developed.  Tired and mildly toxic. Eyes: Pupils reactive, normal extraocular movements. Sclera nonicteric Neuro: CN II-XII intact.  No major focal sensory defects.  No major motor deficits. Lymph: No head/neck/groin lymphadenopathy Psych:  No severe agitation.  No severe anxiety.  Somewhat confused needing hand mitts given to prior pulling at lines.  Judgment & insight Impaired, Oriented x2, HENT: Normocephalic, Mucus membranes moist.  No thrush.   Neck: Supple, No tracheal deviation.  No obvious thyromegaly Chest: No pain to chest wall compression.  Good respiratory excursion.  No audible wheezing CV:  Pulses intact.  regular rhythm.  No major  extremity edema  Abdomen:  Obese Hernia: Not present. Diastasis recti: Not present. Soft.   Mildly distended.  Mild discomfort in lower abdomen but no major guarding..  No hepatomegaly.  No splenomegaly  Gen:  Inguinal hernia: Not present.  Inguinal lymph nodes: without lymphadenopathy.  Foley catheter in place with Analis Distler hematuria.  Rectal: (Deferred)  Ext: No obvious deformity or contracture.  Edema: 1+ mild.  No cyanosis Skin: No major subcutaneous nodules.  Warm and dry Musculoskeletal: Severe joint rigidity not present.  No obvious clubbing.  No digital petechiae.     Results:   Labs: Results for orders placed or performed during the hospital encounter of 06/02/23 (from the past 48 hours)  Urinalysis, w/ Reflex to Culture (Infection Suspected) -Urine, Clean Catch     Status: Abnormal   Collection Time: 06/02/23  5:21 AM  Result Value Ref Range   Specimen Source URINE, CLEAN CATCH    Color, Urine YELLOW YELLOW   APPearance CLOUDY (A) CLEAR   Specific Gravity, Urine 1.013 1.005 - 1.030   pH 5.0 5.0 - 8.0   Glucose, UA NEGATIVE NEGATIVE mg/dL   Hgb urine dipstick MODERATE (A) NEGATIVE   Bilirubin Urine NEGATIVE NEGATIVE   Ketones, ur NEGATIVE NEGATIVE mg/dL   Protein, ur 161 (A) NEGATIVE mg/dL   Nitrite POSITIVE (A) NEGATIVE   Leukocytes,Ua LARGE (A) NEGATIVE   RBC / HPF 21-50 0 - 5 RBC/hpf   WBC, UA >50 0 - 5 WBC/hpf    Comment:        Reflex urine culture not performed if WBC <=10, OR if Squamous epithelial cells >5. If Squamous epithelial cells >5 suggest recollection.    Bacteria, UA FEW (A) NONE SEEN   Squamous Epithelial / HPF 0-5 0 - 5 /HPF   Mucus PRESENT  Comment: Performed at [facility], [address] [place], [place] [address]  I-Stat Lactic Acid, ED     Status: None   Collection Time: 06/02/23  5:22 AM  Result Value Ref Range   Lactic Acid, Venous 0.9 0.5 - 1.9 mmol/L  I-Stat Chem 8, ED     Status: Abnormal   Collection Time: 06/02/23  5:22  AM  Result Value Ref Range   Sodium 137 135 - 145 mmol/L   Potassium 4.1 3.5 - 5.1 mmol/L   Chloride 102 98 - 111 mmol/L   BUN 16 8 - 23 mg/dL   Creatinine, Ser 6.57 (H) 0.61 - 1.24 mg/dL   Glucose, Bld 846 (H) 70 - 99 mg/dL    Comment: Glucose reference range applies only to samples taken after fasting for at least 8 hours.   Calcium, Ion 1.05 (L) 1.15 - 1.40 mmol/L   TCO2 24 22 - 32 mmol/L   Hemoglobin 8.8 (L) 13.0 - 17.0 g/dL   HCT 96.2 (L) 95.2 - 84.1 %  Comprehensive metabolic panel     Status: Abnormal   Collection Time: 06/02/23  5:24 AM  Result Value Ref Range   Sodium 137 135 - 145 mmol/L   Potassium 4.0 3.5 - 5.1 mmol/L   Chloride 103 98 - 111 mmol/L   CO2 24 22 - 32 mmol/L   Glucose, Bld 153 (H) 70 - 99 mg/dL    Comment: Glucose reference range applies only to samples taken after fasting for at least 8 hours.   BUN 15 8 - 23 mg/dL   Creatinine, Ser 3.24 (H) 0.61 - 1.24 mg/dL   Calcium 8.3 (L) 8.9 - 10.3 mg/dL   Total Protein 6.5 6.5 - 8.1 g/dL   Albumin 2.6 (L) 3.5 - 5.0 g/dL   AST 18 15 - 41 U/L   ALT 10 0 - 44 U/L   Alkaline Phosphatase 51 38 - 126 U/L   Total Bilirubin 0.5 <1.2 mg/dL   GFR, Estimated >40 >10 mL/min    Comment: (NOTE) Calculated using the CKD-EPI Creatinine Equation (2021)    Anion gap 10 5 - 15    Comment: Performed at [facility], [address] [place], [place] [address]  CBC with Differential     Status: Abnormal   Collection Time: 06/02/23  5:24 AM  Result Value Ref Range   WBC 10.6 (H) 4.0 - 10.5 K/uL   RBC 3.20 (L) 4.22 - 5.81 MIL/uL   Hemoglobin 8.8 (L) 13.0 - 17.0 g/dL   HCT 66.4 (L) 40.3 - 47.4 %   MCV 89.1 80.0 - 100.0 fL   MCH 27.5 26.0 - 34.0 pg   MCHC 30.9 30.0 - 36.0 g/dL   RDW 25.9 (H) 56.3 - 87.5 %   Platelets 110 (L) 150 - 400 K/uL   nRBC 0.0 0.0 - 0.2 %   Neutrophils Relative % 77 %   Neutro Abs 8.2 (H) 1.7 - 7.7 K/uL   Lymphocytes Relative 8 %   Lymphs Abs 0.9 0.7 - 4.0 K/uL   Monocytes Relative 13 %    Monocytes Absolute 1.4 (H) 0.1 - 1.0 K/uL   Eosinophils Relative 0 %   Eosinophils Absolute 0.0 0.0 - 0.5 K/uL   Basophils Relative 1 %   Basophils Absolute 0.1 0.0 - 0.1 K/uL   Immature Granulocytes 1 %   Abs Immature Granulocytes 0.07 0.00 - 0.07 K/uL    Comment: Performed at [facility],  [address] [place], [place] [address]  Protime-INR     Status: None   Collection Time: 06/02/23  5:24 AM  Result Value Ref Range   Prothrombin Time 15.0 11.4 - 15.2 seconds   INR 1.2 0.8 - 1.2    Comment: (NOTE) INR goal varies based on device and disease states. Performed at [facility], [address] [place], [place] [address]   APTT     Status: None   Collection Time: 06/02/23  5:24 AM  Result Value Ref Range   aPTT 30 24 - 36 seconds    Comment: Performed at [facility], [address] [place], [place] [address]  Procalcitonin     Status: None   Collection Time: 06/02/23  5:24 AM  Result Value Ref Range   Procalcitonin 0.21 ng/mL    Comment:        Interpretation: PCT (Procalcitonin) <= 0.5 ng/mL: Systemic infection (sepsis) is not likely. Local bacterial infection is possible. (NOTE)       Sepsis PCT Algorithm           Lower Respiratory Tract                                      Infection PCT Algorithm    ----------------------------     ----------------------------         PCT < 0.25 ng/mL                PCT < 0.10 ng/mL          Strongly encourage             Strongly discourage   discontinuation of antibiotics    initiation of antibiotics    ----------------------------     -----------------------------       PCT 0.25 - 0.50 ng/mL            PCT 0.10 - 0.25 ng/mL               OR       >80% decrease in PCT            Discourage initiation of                                            antibiotics      Encourage discontinuation           of antibiotics    ----------------------------     -----------------------------         PCT >= 0.50 ng/mL               PCT 0.26 - 0.50 ng/mL               AND        <80% decrease in PCT             Encourage initiation of                                             antibiotics       Encourage continuation           of antibiotics    ----------------------------     -----------------------------  PCT >= 0.50 ng/mL                  PCT > 0.50 ng/mL               AND         increase in PCT                  Strongly encourage                                      initiation of antibiotics    Strongly encourage escalation           of antibiotics                                     -----------------------------                                           PCT <= 0.25 ng/mL                                                 OR                                        > 80% decrease in PCT                                      Discontinue / Do not initiate                                             antibiotics  Performed at [facility], [address] [place], [place] [address]   Resp panel by RT-PCR (RSV, Flu A&B, Covid) Anterior Nasal Swab     Status: None   Collection Time: 06/02/23  5:58 AM   Specimen: Anterior Nasal Swab  Result Value Ref Range   SARS Coronavirus 2 by RT PCR NEGATIVE NEGATIVE   Influenza A by PCR NEGATIVE NEGATIVE   Influenza B by PCR NEGATIVE NEGATIVE    Comment: (NOTE) The Xpert Xpress SARS-CoV-2/FLU/RSV plus assay is intended as an aid in the diagnosis of influenza from Nasopharyngeal swab specimens and should not be used as a sole basis for treatment. Nasal washings and aspirates are unacceptable for Xpert Xpress SARS-CoV-2/FLU/RSV testing.  Fact Sheet for Patients: BloggerCourse.com  Fact Sheet for Healthcare Providers: SeriousBroker.it  This test is not yet approved or cleared by the [place] FDA and has been authorized for detection and/or diagnosis of SARS-CoV-2 by FDA under an Emergency Use  Authorization (EUA). This EUA will remain in effect (meaning this test can be used) for the duration of the COVID-19 declaration under Section 564(b)(1) of the Act, 21 U.S.C. section 360bbb-3(b)(1), unless the authorization is terminated or revoked.  Resp Syncytial Virus by PCR NEGATIVE NEGATIVE    Comment: (NOTE) Fact Sheet for Patients: BloggerCourse.com  Fact Sheet for Healthcare Providers: SeriousBroker.it  This test is not yet approved or cleared by the [place] FDA and has been authorized for detection and/or diagnosis of SARS-CoV-2 by FDA under an Emergency Use Authorization (EUA). This EUA will remain in effect (meaning this test can be used) for the duration of the COVID-19 declaration under Section 564(b)(1) of the Act, 21 U.S.C. section 360bbb-3(b)(1), unless the authorization is terminated or revoked.  Performed at [facility], [address], [place], [place] [address]   Troponin I (High Sensitivity)     Status: Abnormal   Collection Time: 06/02/23  9:28 AM  Result Value Ref Range   Troponin I (High Sensitivity) 22 (H) <18 ng/L    Comment: (NOTE) Elevated high sensitivity troponin I (hsTnI) values and significant  changes across serial measurements may suggest ACS but many other  chronic and acute conditions are known to elevate hsTnI results.  Refer to the "Links" section for chest pain algorithms and additional  guidance. Performed at [facility], [address] [place], [place] [address]   CK     Status: None   Collection Time: 06/02/23  9:28 AM  Result Value Ref Range   Total CK 291 49 - 397 U/L    Comment: Performed at [facility], [address] [place], [place] [address]  I-Stat Lactic Acid, ED     Status: Abnormal   Collection Time: 06/02/23  9:51 AM  Result Value Ref Range   Lactic Acid, Venous 2.3 (HH) 0.5 - 1.9 mmol/L   Comment NOTIFIED PHYSICIAN   I-stat chem 8, ed      Status: Abnormal   Collection Time: 06/02/23  9:56 AM  Result Value Ref Range   Sodium 138 135 - 145 mmol/L   Potassium 4.0 3.5 - 5.1 mmol/L   Chloride 102 98 - 111 mmol/L   BUN 19 8 - 23 mg/dL   Creatinine, Ser 9.14 (H) 0.61 - 1.24 mg/dL   Glucose, Bld 782 (H) 70 - 99 mg/dL    Comment: Glucose reference range applies only to samples taken after fasting for at least 8 hours.   Calcium, Ion 1.13 (L) 1.15 - 1.40 mmol/L   TCO2 27 22 - 32 mmol/L   Hemoglobin 8.2 (L) 13.0 - 17.0 g/dL   HCT 95.6 (L) 21.3 - 08.6 %    Imaging / Studies: CT ABDOMEN PELVIS W WO CONTRAST Result Date: 06/02/2023 CLINICAL DATA:  72 year old male with history of dementia. Suspected urolithiasis. EXAM: CT ABDOMEN AND PELVIS WITHOUT AND WITH CONTRAST TECHNIQUE: Multidetector CT imaging of the abdomen and pelvis was performed following the standard protocol before and following the bolus administration of intravenous contrast. RADIATION DOSE REDUCTION: This exam was performed according to the departmental dose-optimization program which includes automated exposure control, adjustment of the mA and/or kV according to patient size and/or use of iterative reconstruction technique. CONTRAST:  75mL OMNIPAQUE IOHEXOL 350 MG/ML SOLN COMPARISON:  CT of the abdomen and pelvis 05/16/2023. FINDINGS: Lower chest: Calcified granuloma in the right middle lobe. Atherosclerotic calcifications in the left main, left anterior descending and right coronary arteries. Hepatobiliary: No definite suspicious cystic or solid hepatic lesions are confidently identified on today's noncontrast CT examination. Unenhanced appearance of the gallbladder is unremarkable. Pancreas: No definite pancreatic mass or peripancreatic fluid collections or inflammatory changes are noted on today's noncontrast CT  examination. Spleen: Unremarkable. Adrenals/Urinary Tract: There is a large amount of gas present in the nondependent aspect of the urinary bladder. There also  appears to be some debris dependently in the urinary bladder, which also contains a small amount of gas (best appreciated on axial image 91 of series 3). Superior aspect of the urinary bladder appears thickened and intimately associated with the adjacent sigmoid colon, centered around what appears to be a large diverticulum, best appreciated on coronal image 56 of series 10. Small amount of gas in the superior wall of the urinary bladder (coronal image 57 of series 10). Moderate right and mild left hydroureteronephrosis. Small amount of gas within the collecting systems of both kidneys (right greater than left). Unenhanced appearance of the kidneys and bilateral adrenal glands is otherwise unremarkable. Stomach/Bowel: Unenhanced appearance of the stomach is normal. No pathologic dilatation of small bowel or colon. 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 Normal appendix. Vascular/Lymphatic: Atherosclerosis in the abdominal aorta and pelvic vasculature. No lymphadenopathy noted in the abdomen or pelvis. Reproductive: Prostate gland and seminal vesicles are unremarkable in appearance. Other: No significant volume of ascites.  No pneumoperitoneum. Musculoskeletal: There are no aggressive appearing lytic or blastic lesions noted in the visualized portions of the skeleton. IMPRESSION: 1. 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 Urologic consultation is recommended for further clinical evaluation. 2. Aortic atherosclerosis. Electronically Signed   By: Trudie Reed M.D.   On: 06/02/2023 10:19   CT Head Wo Contrast Result Date: 06/02/2023 CLINICAL DATA:  The patient fell out of bed today sustaining head and neck  trauma. EXAM: CT HEAD WITHOUT CONTRAST CT CERVICAL SPINE WITHOUT CONTRAST TECHNIQUE: Multidetector CT imaging of the head and cervical spine was performed following the standard protocol without intravenous contrast. Multiplanar CT image reconstructions of the cervical spine were also generated. RADIATION DOSE REDUCTION: This exam was performed according to the departmental dose-optimization program which includes automated exposure control, adjustment of the mA and/or kV according to patient size and/or use of iterative reconstruction technique. COMPARISON:  Head CT and cervical spine CT recently both on 05/13/2023. FINDINGS: CT HEAD FINDINGS Brain: There is mild global atrophy, mild atrophic ventriculomegaly and small-vessel disease of the cerebral white matter, chronic senescent mineralization in the basal ganglia. No new abnormality concerning for a cortical based acute infarct, hemorrhage, mass or mass effect is seen and no midline shift. Basal cisterns are clear. There have been no appreciable interval changes. Vascular: No hyperdense vessel or unexpected calcification. Skull: Negative for fractures or focal lesions. Sinuses/Orbits: Patchy membrane thickening again noted in the ethmoid air cells with mild membrane disease in the maxillary sinuses. The frontal and sphenoid sinus, bilateral mastoid air cells, and middle ears are clear. The nasal septum is S shaped. Old lens extractions with otherwise negative orbits. Other: None. CT CERVICAL SPINE FINDINGS Alignment: Reversed cervical lordosis centered at C4-5, stable, with unchanged 3 mm grade 1 C4-5 anterolisthesis. No other listhesis is seen. Bone-on-bone anterior atlantodental joint space loss is also again noted, with osteophytes. Skull base and vertebrae: There is mild osteopenia without evidence of fractures, primary bone lesions or focal pathologic process. There is chronic ankylosis across the C4-5 and C5-6 facet joints. There are anterior bridging  osteophytes C4-7. Soft tissues and  spinal canal: No prevertebral fluid or swelling. No visible canal hematoma. There are minimal calcifications at the carotid bifurcations. No thyroid or laryngeal mass. Disc levels: There is chronic disc collapse C5-6, C6-7, C7-T1, normal disc heights above C5. There are dorsal disc osteophyte complexes C4-5 through C7-T1, associated with spinal canal stenosis and mild spondylotic cord compression from C4-5 through C6-7. The upper levels do not show significant soft tissue or bony encroachment on the spinal canal. There is multilevel facet joint and uncinate hypertrophy, with foraminal stenosis which is severe on the left at C2-3, bilaterally severe C3-4 through C5-6, bilaterally moderate to severe C6-7. Upper chest: Negative. Other: None. IMPRESSION: 1. No acute intracranial CT findings or depressed skull fractures. 2. Atrophy and small-vessel disease. 3. 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 4. Osteopenia and degenerative change without evidence of cervical fractures. 5. Reversed cervical lordosis with grade 1 C4-5 degenerative anterolisthesis. 6. Multilevel spinal canal and foraminal stenosis. 7. Carotid atherosclerosis. Electronically Signed   By: Almira Bar M.D.   On: 06/02/2023 07:03   CT Cervical Spine Wo Contrast Result Date: 06/02/2023 CLINICAL DATA:  The patient fell out of bed today sustaining head and neck trauma. EXAM: CT HEAD WITHOUT CONTRAST CT CERVICAL SPINE WITHOUT CONTRAST TECHNIQUE: Multidetector CT imaging of the head and cervical spine was performed following the standard protocol without intravenous contrast. Multiplanar CT image reconstructions of the cervical spine were also generated. RADIATION DOSE REDUCTION: This exam was performed according to the departmental dose-optimization program which includes automated exposure control, adjustment of the mA and/or kV according to patient size and/or use of iterative reconstruction technique.  COMPARISON:  Head CT and cervical spine CT recently both on 05/13/2023. FINDINGS: CT HEAD FINDINGS Brain: There is mild global atrophy, mild atrophic ventriculomegaly and small-vessel disease of the cerebral white matter, chronic senescent mineralization in the basal ganglia. No new abnormality concerning for a cortical based acute infarct, hemorrhage, mass or mass effect is seen and no midline shift. Basal cisterns are clear. There have been no appreciable interval changes. Vascular: No hyperdense vessel or unexpected calcification. Skull: Negative for fractures or focal lesions. Sinuses/Orbits: Patchy membrane thickening again noted in the ethmoid air cells with mild membrane disease in the maxillary sinuses. The frontal and sphenoid sinus, bilateral mastoid air cells, and middle ears are clear. The nasal septum is S shaped. Old lens extractions with otherwise negative orbits. Other: None. CT CERVICAL SPINE FINDINGS Alignment: Reversed cervical lordosis centered at C4-5, stable, with unchanged 3 mm grade 1 C4-5 anterolisthesis. No other listhesis is seen. Bone-on-bone anterior atlantodental joint space loss is also again noted, with osteophytes. Skull base and vertebrae: There is mild osteopenia without evidence of fractures, primary bone lesions or focal pathologic process. There is chronic ankylosis across the C4-5 and C5-6 facet joints. There are anterior bridging osteophytes C4-7. Soft tissues and spinal canal: No prevertebral fluid or swelling. No visible canal hematoma. There are minimal calcifications at the carotid bifurcations. No thyroid or laryngeal mass. Disc levels: There is chronic disc collapse C5-6, C6-7, C7-T1, normal disc heights above C5. There are dorsal disc osteophyte complexes C4-5 through C7-T1, associated with spinal canal stenosis and mild spondylotic cord compression from C4-5 through C6-7. The upper levels do not show significant soft tissue or bony encroachment on the spinal canal.  There is multilevel facet joint and uncinate hypertrophy, with foraminal stenosis which is severe on the left at C2-3, bilaterally severe C3-4 through C5-6, bilaterally moderate to severe C6-7. Upper chest: Negative.  Other: None. IMPRESSION: 1. No acute intracranial CT findings or depressed skull fractures. 2. Atrophy and small-vessel disease. 3. 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 4. Osteopenia and degenerative change without evidence of cervical fractures. 5. Reversed cervical lordosis with grade 1 C4-5 degenerative anterolisthesis. 6. Multilevel spinal canal and foraminal stenosis. 7. Carotid atherosclerosis. Electronically Signed   By: Almira Bar M.D.   On: 06/02/2023 07:03   DG Chest Port 1 View Result Date: 06/02/2023 CLINICAL DATA:  72 year old male with possible sepsis. Fall, altered mental status. EXAM: PORTABLE CHEST 1 VIEW COMPARISON:  Portable chest 05/12/2023 and earlier. FINDINGS: Portable AP semi upright view at 0546 hours. Stable low lung volumes. More rotated to the left now. Stable cardiac size and mediastinal contours. Stable lung volumes and ventilation. Calcified right midlung granuloma (no follow-up imaging recommended). No pneumothorax, pleural effusion, acute pulmonary opacity. Paucity of bowel gas the visible abdomen. No acute osseous abnormality identified. IMPRESSION: Chronically Low lung volumes. No acute cardiopulmonary abnormality or acute traumatic injury identified. Electronically Signed   By: Odessa Fleming M.D.   On: 06/02/2023 05:59   CT RENAL STONE STUDY Result Date: 05/16/2023 CLINICAL DATA:  Flank pain, bacteremia, UTI EXAM: CT ABDOMEN AND PELVIS WITHOUT CONTRAST TECHNIQUE: Multidetector CT imaging of the abdomen and pelvis was performed following the standard protocol without IV contrast. RADIATION DOSE REDUCTION: This exam was performed according to the departmental dose-optimization program which includes automated exposure control, adjustment of the mA and/or  kV according to patient size and/or use of iterative reconstruction technique. COMPARISON:  09/12/2016 FINDINGS: Lower chest: No acute abnormality. Relative hypoattenuation of the cardiac blood pool indicative of anemia. Hepatobiliary: Unremarkable unenhanced appearance of the liver. No focal liver lesion identified. Gallbladder within normal limits. No hyperdense gallstone. No biliary dilatation. Pancreas: Unremarkable. No pancreatic ductal dilatation or surrounding inflammatory changes. Spleen: Normal in size without focal abnormality. Adrenals/Urinary Tract: Unremarkable adrenal glands. Mild left hydroureteronephrosis. No renal or ureteral calculi. No solid renal lesion. No right-sided hydronephrosis. There is wall thickening of the urinary bladder, most pronounced posteriorly. Moderate volume of air within the bladder lumen. Stomach/Bowel: Stomach is within normal limits. Appendix appears normal. No dilated loops of bowel. Extensive colonic diverticulosis. There is mild fat stranding adjacent to the proximal sigmoid colon. There is mild long segment wall thickening of the sigmoid colon. No focally inflamed diverticulum is identified. Vascular/Lymphatic: Aortic atherosclerosis. No enlarged abdominal or pelvic lymph nodes. Reproductive: Mildly enlarged prostate gland. Other: No free fluid. No abdominopelvic fluid collection. No pneumoperitoneum. No abdominal wall hernia. Musculoskeletal: No acute or significant osseous findings. IMPRESSION: 1. Wall thickening of the urinary bladder, most pronounced posteriorly. Moderate volume of air within the bladder lumen. Findings are suggestive of cystitis. Correlate with urinalysis. 2. Mild left hydroureteronephrosis. No renal or ureteral calculi. 3. Extensive colonic diverticulosis. Mild fat stranding adjacent to the proximal sigmoid colon. There is mild long segment wall thickening of the sigmoid colon, which may reflect sequela of chronic diverticulitis. Early changes of  diverticulitis is not excluded. Correlate with patient's symptoms. 4. Mildly enlarged prostate gland. 5. Relative hypoattenuation of the cardiac blood pool indicative of anemia. 6. Aortic atherosclerosis (ICD10-I70.0). Electronically Signed   By: Duanne Guess D.O.   On: 05/16/2023 11:33   ECHOCARDIOGRAM COMPLETE Result Date: 05/13/2023    ECHOCARDIOGRAM REPORT   Patient Name:   JEMERY UMSTEAD Date of Exam: 05/13/2023 Medical Rec #:  [id number]   Height:       66.0 in Accession #:  [id number]  Weight:       241.0 lb Date of Birth:  [birth year]   BSA:          2.165 m Patient Age:    72 years    BP:           126/73 mmHg Patient Gender: M           HR:           77 bpm. Exam Location:  Inpatient Procedure: 2D Echo, Cardiac Doppler, Color Doppler and Intracardiac            Opacification Agent Indications:    Syncope  History:        Patient has prior history of Echocardiogram examinations, most                 recent 03/15/2023. Risk Factors:Hypertension, Dyslipidemia and                 Sleep Apnea.  Sonographer:    Karma Ganja Referring Phys: [id number] RATHORE  Sonographer Comments: Technically difficult study due to poor echo windows. Image acquisition challenging due to respiratory motion. IMPRESSIONS  1. Abnormal septal motion. Left ventricular ejection fraction, by estimation, is 50 to 55%. The left ventricle has low normal function. The left ventricle has no regional wall motion abnormalities. There is mild left ventricular hypertrophy. Left ventricular diastolic parameters were normal.  2. Right ventricular systolic function is normal. The right ventricular size is normal. There is normal pulmonary artery systolic pressure.  3. The mitral valve is normal in structure. No evidence of mitral valve regurgitation. No evidence of mitral stenosis.  4. The aortic valve is tricuspid. There is moderate calcification of the aortic valve. There is moderate thickening of the aortic valve. Aortic valve  regurgitation is not visualized. Aortic valve sclerosis is present, with no evidence of aortic valve stenosis.  5. The inferior vena cava is normal in size with greater than 50% respiratory variability, suggesting right atrial pressure of 3 mmHg. FINDINGS  Left Ventricle: Abnormal septal motion. Left ventricular ejection fraction, by estimation, is 50 to 55%. The left ventricle has low normal function. The left ventricle has no regional wall motion abnormalities. The left ventricular internal cavity size was normal in size. There is mild left ventricular hypertrophy. Left ventricular diastolic parameters were normal. Right Ventricle: The right ventricular size is normal. No increase in right ventricular wall thickness. Right ventricular systolic function is normal. There is normal pulmonary artery systolic pressure. The tricuspid regurgitant velocity is 2.74 m/s, and  with an assumed right atrial pressure of 3 mmHg, the estimated right ventricular systolic pressure is 33.0 mmHg. Left Atrium: Left atrial size was normal in size. Right Atrium: Right atrial size was normal in size. Pericardium: There is no evidence of pericardial effusion. Mitral Valve: The mitral valve is normal in structure. No evidence of mitral valve regurgitation. No evidence of mitral valve stenosis. Tricuspid Valve: The tricuspid valve is normal in structure. Tricuspid valve regurgitation is not demonstrated. No evidence of tricuspid stenosis. Aortic Valve: The aortic valve is tricuspid. There is moderate calcification of the aortic valve. There is moderate thickening of the aortic valve. Aortic valve regurgitation is not visualized. Aortic valve sclerosis is present, with no evidence of aortic valve stenosis. Aortic valve mean gradient measures 4.0 mmHg. Aortic valve peak gradient measures 6.4 mmHg. Aortic valve area, by VTI measures 2.71 cm. Pulmonic Valve: The pulmonic valve was normal in structure. Pulmonic valve regurgitation is  not  visualized. No evidence of pulmonic stenosis. Aorta: The aortic root is normal in size and structure. Venous: The inferior vena cava is normal in size with greater than 50% respiratory variability, suggesting right atrial pressure of 3 mmHg. IAS/Shunts: No atrial level shunt detected by color flow Doppler.  LEFT VENTRICLE PLAX 2D LVIDd:         5.00 cm      Diastology LVIDs:         3.90 cm      LV e' medial:    7.29 cm/s LV PW:         1.20 cm      LV E/e' medial:  9.3 LV IVS:        1.30 cm      LV e' lateral:   15.10 cm/s LVOT diam:     2.10 cm      LV E/e' lateral: 4.5 LV SV:         64 LV SV Index:   29 LVOT Area:     3.46 cm  LV Volumes (MOD) LV vol d, MOD A2C: 113.0 ml LV vol d, MOD A4C: 139.0 ml LV vol s, MOD A2C: 56.9 ml LV vol s, MOD A4C: 67.0 ml LV SV MOD A2C:     56.1 ml LV SV MOD A4C:     139.0 ml LV SV MOD BP:      64.9 ml RIGHT VENTRICLE RV Basal diam:  3.70 cm RV S prime:     19.10 cm/s TAPSE (M-mode): 3.2 cm LEFT ATRIUM             Index        RIGHT ATRIUM           Index LA diam:        3.70 cm 1.71 cm/m   RA Area:     15.00 cm LA Vol (A2C):   74.3 ml 34.32 ml/m  RA Volume:   31.00 ml  14.32 ml/m LA Vol (A4C):   52.9 ml 24.44 ml/m LA Biplane Vol: 67.8 ml 31.32 ml/m  AORTIC VALVE AV Area (Vmax):    2.75 cm AV Area (Vmean):   2.66 cm AV Area (VTI):     2.71 cm AV Vmax:           126.00 cm/s AV Vmean:          86.900 cm/s AV VTI:            0.235 m AV Peak Grad:      6.4 mmHg AV Mean Grad:      4.0 mmHg LVOT Vmax:         100.00 cm/s LVOT Vmean:        66.700 cm/s LVOT VTI:          0.184 m LVOT/AV VTI ratio: 0.78  AORTA Ao Root diam: 3.80 cm Ao Asc diam:  3.90 cm MITRAL VALVE               TRICUSPID VALVE MV Area (PHT): 3.93 cm    TR Peak grad:   30.0 mmHg MV Decel Time: 193 msec    TR Vmax:        274.00 cm/s MV E velocity: 67.90 cm/s MV A velocity: 83.90 cm/s  SHUNTS MV E/A ratio:  0.81        Systemic VTI:  0.18 m  Systemic Diam: 2.10 cm Charlton Haws MD  Electronically signed by Charlton Haws MD Signature Date/Time: 05/13/2023/10:16:49 AM    Final    CT HEAD WO CONTRAST ( ) Result Date: 05/13/2023 CLINICAL DATA:  Unwitnessed fall, altered mental status EXAM: CT HEAD WITHOUT CONTRAST CT CERVICAL SPINE WITHOUT CONTRAST TECHNIQUE: Multidetector CT imaging of the head and cervical spine was performed following the standard protocol without intravenous contrast. Multiplanar CT image reconstructions of the cervical spine were also generated. RADIATION DOSE REDUCTION: This exam was performed according to the departmental dose-optimization program which includes automated exposure control, adjustment of the mA and/or kV according to patient size and/or use of iterative reconstruction technique. COMPARISON:  CT head dated 01/29/2016 FINDINGS: CT HEAD FINDINGS Brain: No evidence of acute infarction, hemorrhage, hydrocephalus, extra-axial collection or mass lesion/mass effect. Global cortical atrophy. Mild subcortical white matter and periventricular small vessel ischemic changes. Vascular: No hyperdense vessel or unexpected calcification. Skull: Normal. Negative for fracture or focal lesion. Sinuses/Orbits: The visualized paranasal sinuses are essentially clear. The mastoid air cells are unopacified. Other: None. CT CERVICAL SPINE FINDINGS Alignment: Loss of the normal cervical lordosis. Skull base and vertebrae: No acute fracture. No primary bone lesion or focal pathologic process. Soft tissues and spinal canal: No prevertebral fluid or swelling. No visible canal hematoma. Disc levels: Mild degenerative changes of the mid/lower cervical spine. Spinal canal is patent. Upper chest: Negative. Other: None. IMPRESSION: No acute intracranial abnormality. Atrophy with small vessel ischemic changes. No traumatic injury to the cervical spine. Mild degenerative changes. Electronically Signed   By: Charline Bills M.D.   On: 05/13/2023 00:24   CT CERVICAL SPINE WO  CONTRAST Result Date: 05/13/2023 CLINICAL DATA:  Unwitnessed fall, altered mental status EXAM: CT HEAD WITHOUT CONTRAST CT CERVICAL SPINE WITHOUT CONTRAST TECHNIQUE: Multidetector CT imaging of the head and cervical spine was performed following the standard protocol without intravenous contrast. Multiplanar CT image reconstructions of the cervical spine were also generated. RADIATION DOSE REDUCTION: This exam was performed according to the departmental dose-optimization program which includes automated exposure control, adjustment of the mA and/or kV according to patient size and/or use of iterative reconstruction technique. COMPARISON:  CT head dated 01/29/2016 FINDINGS: CT HEAD FINDINGS Brain: No evidence of acute infarction, hemorrhage, hydrocephalus, extra-axial collection or mass lesion/mass effect. Global cortical atrophy. Mild subcortical white matter and periventricular small vessel ischemic changes. Vascular: No hyperdense vessel or unexpected calcification. Skull: Normal. Negative for fracture or focal lesion. Sinuses/Orbits: The visualized paranasal sinuses are essentially clear. The mastoid air cells are unopacified. Other: None. CT CERVICAL SPINE FINDINGS Alignment: Loss of the normal cervical lordosis. Skull base and vertebrae: No acute fracture. No primary bone lesion or focal pathologic process. Soft tissues and spinal canal: No prevertebral fluid or swelling. No visible canal hematoma. Disc levels: Mild degenerative changes of the mid/lower cervical spine. Spinal canal is patent. Upper chest: Negative. Other: None. IMPRESSION: No acute intracranial abnormality. Atrophy with small vessel ischemic changes. No traumatic injury to the cervical spine. Mild degenerative changes. Electronically Signed   By: Charline Bills M.D.   On: 05/13/2023 00:24   DG Chest Port 1 View Result Date: 05/13/2023 CLINICAL DATA:  Un witnessed fall, initial encounter EXAM: PORTABLE CHEST 1 VIEW COMPARISON:  01/29/16  FINDINGS: Cardiac shadow is mildly prominent but stable. Lungs are hypoinflated. Calcified granuloma is noted in the right mid lung stable from the prior exam. No acute bony abnormality is seen. IMPRESSION: Changes of prior granulomatous disease. No acute abnormality noted.  Electronically Signed   By: Alcide Clever M.D.   On: 05/13/2023 00:03    Medications / Allergies: per chart  Antibiotics: Anti-infectives (From admission, onward)    Start     Dose/Rate Route Frequency Ordered Stop   06/03/23 0945  cefTRIAXone (ROCEPHIN) 2 g in sodium chloride 0.9 % 100 mL IVPB        2 g 200 mL/hr over 30 Minutes Intravenous Every 24 hours 06/02/23 1022     06/02/23 1130  metroNIDAZOLE (FLAGYL) IVPB 500 mg        500 mg 100 mL/hr over 60 Minutes Intravenous Every 12 hours 06/02/23 1128     06/02/23 0945  cefTRIAXone (ROCEPHIN) 1 g in sodium chloride 0.9 % 100 mL IVPB  Status:  Discontinued        1 g 200 mL/hr over 30 Minutes Intravenous Every 24 hours 06/02/23 0931 06/02/23 1022   06/02/23 0545  aztreonam (AZACTAM) 1 g in sodium chloride 0.9 % 100 mL IVPB  Status:  Discontinued        1 g 200 mL/hr over 30 Minutes Intravenous  Once 06/02/23 0534 06/02/23 0534   06/02/23 0545  aztreonam (AZACTAM) 2 g in sodium chloride 0.9 % 100 mL IVPB        2 g 200 mL/hr over 30 Minutes Intravenous  Once 06/02/23 0535 06/02/23 8119         Note: Portions of this report may have been transcribed using voice recognition software. Every effort was made to ensure accuracy; however, inadvertent computerized transcription errors may be present.   Any transcriptional errors that result from this process are unintentional.    Ardeth Sportsman, MD, FACS, MASCRS Esophageal, Gastrointestinal & Colorectal Surgery Robotic and Minimally Invasive Surgery  [facility] [address] [place], [place] [address] [phone] Fax [phone]  Main  CONTACT INFORMATION: Weekday (9AM-5PM): Call [facility] main office at [phone] Weeknight (5PM-9AM) or Weekend/Holiday: Check EPIC "Web Links" tab & use "AMION" (password " [email or web address]") for General Surgery [facility] coverage  Please, DO NOT use SecureChat  (it is not reliable communication to reach operating surgeons & will lead to a delay in care).   Epic staff messaging available for outptient concerns needing 1-2 business day response.       06/02/2023  1:52 PM

## 2023-06-02 NOTE — ED Notes (Signed)
 Pt is on monitor. CCMD notified.

## 2023-06-02 NOTE — H&P (Signed)
History and Physical    Patient: Calvin Escobar VQQ:[id number] DOB: 01-28-1951 DOA: 06/02/2023 DOS: the patient was seen and examined on 06/02/2023 PCP: [name], MD  Patient coming from: Home - lives with his wife and son. He ambulates independently    Chief Complaint: fall with poor oral intake   HPI: Calvin Escobar is a 71 y.o. male with medical history significant of HFrEF, HTN, HLD, BPH, dementia, bipolar disorder. OSA on bipap, tremor who presented to ED with fall and AMS. Similar presentation a few weeks ago. He was noted to have E.coli bacteremia at that tie.  Family states he has not wanted to eat or drink for the past few days and worsening tremors. He has had a few falls as well. Wife was unable to get him off the ground so called EMS. He can't really tell me why he is here. Alert to self, birthday and can answer some questions.   Wife tells me that his tremor in his hands were worse this week on Wednesday. She noticed a difference on Thursday with his tremors. Could not drink a cup of coffee without spilling on his clothes. He stopped really eating after this and has not had much to eat since Thursday. On Thursday he fell and can not recall what happened. She thought he had tripped over his feet. This morning she found him on the ground on his stomach by his bed. He told her had to go to the bathroom and then he wouldn't talk to her after this. Unknown time on the ground.   Admitted 12/9-12/12 for severe sepsis secondary to E. coli bacteremia (present on admission) secondary to complicated  E. 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 Molli Hazard Gay-urology on 12/12 over the phone-recommendations are for 2 weeks of antibiotics given concern for emphysematous cystitis.   Denies frank hematuria, stomach pain, or back pain. No fever/chills at home.  He does not smoke or drink alcohol. 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 Degenerative change. Reversed cervical lordosis with grade 1 C4-5 degenerative anterolisthesis. Multilevel spinal canal and foraminal stenosis. Carotid atherosclerosis.  In ED: code sepsis. Given 1.5L IVF bolus. Started on aztreonam for pyelo.   Review of Systems: unable to review all systems due to the inability of the patient to answer questions. Past Medical History:  Diagnosis Date   Allergy seasonal   Ascending aorta dilatation (HCC)    38 mm by 2D echo 04/2021   Cancer (HCC) skin   DCM (dilated cardiomyopathy) (HCC)    nonischemic with normal coronary arteries at cath 06/2019.  EF 35 to 40% on echo 04/2021   Dementia (HCC)    Depression    Hyperlipidemia    Lithium toxicity 01/29/2016   OSA treated with BiPAP    SKIN CANCER, HX OF 05/10/2007   Facial left check and forehead and r shoulder  F/w derm   Urinary dribbling 03/14/2022   Past Surgical History:  Procedure Laterality Date   COLONOSCOPY     RIGHT/LEFT HEART CATH AND CORONARY ANGIOGRAPHY N/A 07/03/2019   Procedure: RIGHT/LEFT HEART CATH AND CORONARY ANGIOGRAPHY;  Surgeon: Dolores Patty, MD;  Location: [facility] INVASIVE CV LAB;  Service: Cardiovascular;  Laterality: N/A;   Social History:  reports that he has never smoked. He has never used smokeless tobacco. He reports current alcohol use of about 14.0 standard drinks of alcohol per week. He reports that he does not use drugs.  No Known Allergies  Family History  Problem Relation Age of Onset   Hypertension Mother    Cancer Father    Bone cancer Sister     Prior to Admission medications   Medication Sig Start Date End Date Taking? Authorizing Provider   ARIPiprazole (ABILIFY) 2 MG tablet Take 2 mg by mouth daily. 03/21/23   [provider]  ascorbic acid (VITAMIN C) 500 MG tablet Take 500 mg by mouth daily.    [provider]  busPIRone (BUSPAR) 10 MG tablet Take 10 mg by mouth 3 (three) times daily. 12/27/22   [provider]  calcium-vitamin D (OSCAL WITH D) 500-5 MG-MCG tablet Take 1 tablet by mouth daily with breakfast.    [provider]  carvedilol (COREG) 12.5 MG tablet Take 1 tablet (12.5 mg total) by mouth 2 (two) times daily with a meal. 12/19/22   [name], MD  divalproex (DEPAKOTE ER) 250 MG 24 hr tablet Take 500 mg by mouth at bedtime.    [provider]  folic acid (FOLVITE) 1 MG tablet Take 1 tablet (1 mg total) by mouth daily. 03/07/23 03/06/24  [name], MD  gabapentin (NEURONTIN) 300 MG capsule Take 300 mg by mouth at bedtime. 03/21/23   [provider]  LORazepam (ATIVAN) 1 MG tablet Take one tablet by mouth in the morning and two tablets at bedtime 01/10/23   [provider]  losartan (COZAAR) 50 MG tablet Take 1 tablet (50 mg total) by mouth daily. Replaces entresto, start after done with entresto 12/19/22   [name], MD  mirtazapine (REMERON) 30 MG tablet Take 30 mg by mouth at bedtime. 03/21/23   [provider]  Multiple Vitamin (MULTIVITAMIN) tablet Take 1 tablet by mouth daily.    [provider]  pravastatin (PRAVACHOL) 20 MG tablet Take 1 tablet (20 mg total) by mouth at bedtime. 12/19/22   [name], MD  primidone (MYSOLINE) 50 MG tablet Take 1 tablet (50 mg total) by mouth 4 (four) times daily as needed. For tremor, intention tremor. Patient taking differently: Take 50 mg by mouth 4 (four) times daily as needed (for tremorts, intention tremor). 05/08/23   [name], MD  tamsulosin (FLOMAX) 0.4 MG CAPS capsule TAKE 1 CAPSULE BY MOUTH EVERY DAY 11/12/22   [name], MD  traZODone (DESYREL) 100 MG tablet  Take 300 mg by mouth at bedtime. 02/06/20   [provider]  venlafaxine XR (EFFEXOR-XR) 150 MG 24 hr capsule Take 300 mg by mouth daily with breakfast.    [provider]    Physical Exam: Vitals:   06/02/23 0930 06/02/23 1005 06/02/23 1010 06/02/23 1050  BP: (!) 80/52 97/66  (!) 159/147  Pulse: 66 65 67 99  Resp: (!) 26 (!) 22 (!) 22 (!) 22  Temp:   99.1 F (37.3 C)   TempSrc:   Axillary   SpO2: 100% 98% 100% 98%  Weight:      Height:       General:  Appears calm and comfortable and is in NAD Eyes:  PERRL, EOMI, normal lids, iris ENT:  HOH, lips &  tongue, mmm; appropriate dentition Neck:  no LAD, masses or thyromegaly; no carotid bruits Cardiovascular:  RRR, no m/r/g. No LE edema.  Respiratory:   CTA bilaterally with no wheezes/rales/rhonchi.  Normal respiratory effort. Abdomen:  soft, NT, ND, NABS Back:   normal alignment, no CVAT GU: frank hematuria from urethra  Skin:  no rash or induration seen on limited exam Musculoskeletal:  grossly normal tone BUE/BLE, good ROM, no bony abnormality Lower extremity:  No LE edema.  Limited foot exam with no ulcerations.  2+ distal pulses. Psychiatric:  grossly normal mood and affect, speech fluent and appropriate, AOx1 Neurologic:  CN 2-12 grossly intact, moves all extremities in coordinated fashion, sensation intact   Radiological Exams on Admission: Independently reviewed - see discussion in A/P where applicable  CT ABDOMEN PELVIS W WO CONTRAST Result Date: 06/02/2023 CLINICAL DATA:  72 year old male with history of dementia. Suspected urolithiasis. EXAM: CT ABDOMEN AND PELVIS WITHOUT AND WITH CONTRAST TECHNIQUE: Multidetector CT imaging of the abdomen and pelvis was performed following the standard protocol before and following the bolus administration of intravenous contrast. RADIATION DOSE REDUCTION: This exam was performed according to the departmental dose-optimization program which includes automated exposure  control, adjustment of the mA and/or kV according to patient size and/or use of iterative reconstruction technique. CONTRAST:  75mL OMNIPAQUE IOHEXOL 350 MG/ML SOLN COMPARISON:  CT of the abdomen and pelvis 05/16/2023. FINDINGS: Lower chest: Calcified granuloma in the right middle lobe. Atherosclerotic calcifications in the left main, left anterior descending and right coronary arteries. Hepatobiliary: No definite suspicious cystic or solid hepatic lesions are confidently identified on today's noncontrast CT examination. Unenhanced appearance of the gallbladder is unremarkable. Pancreas: No definite pancreatic mass or peripancreatic fluid collections or inflammatory changes are noted on today's noncontrast CT examination. Spleen: Unremarkable. Adrenals/Urinary Tract: There is a large amount of gas present in the nondependent aspect of the urinary bladder. There also appears to be some debris dependently in the urinary bladder, which also contains a small amount of gas (best appreciated on axial image 91 of series 3). Superior aspect of the urinary bladder appears thickened and intimately associated with the adjacent sigmoid colon, centered around what appears to be a large diverticulum, best appreciated on coronal image 56 of series 10. Small amount of gas in the superior wall of the urinary bladder (coronal image 57 of series 10). Moderate right and mild left hydroureteronephrosis. Small amount of gas within the collecting systems of both kidneys (right greater than left). Unenhanced appearance of the kidneys and bilateral adrenal glands is otherwise unremarkable. Stomach/Bowel: Unenhanced appearance of the stomach is normal. No pathologic dilatation of small bowel or colon. 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 Normal appendix. Vascular/Lymphatic: Atherosclerosis in the abdominal aorta and pelvic vasculature. No lymphadenopathy noted in  the abdomen or pelvis. Reproductive: Prostate gland and seminal vesicles are unremarkable in appearance. Other: No significant volume of ascites.  No pneumoperitoneum. Musculoskeletal: There are no aggressive appearing lytic or blastic lesions noted in the visualized portions of the skeleton. IMPRESSION: 1. 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 Urologic consultation is recommended for further clinical evaluation. 2. Aortic atherosclerosis. Electronically Signed   By: Trudie Reed M.D.   On: 06/02/2023 10:19   CT Head Wo Contrast  Result Date: 06/02/2023 CLINICAL DATA:  The patient fell out of bed today sustaining head and neck trauma. EXAM: CT HEAD WITHOUT CONTRAST CT CERVICAL SPINE WITHOUT CONTRAST TECHNIQUE: Multidetector CT imaging of the head and cervical spine was performed following the standard protocol without intravenous contrast. Multiplanar CT image reconstructions of the cervical spine were also generated. RADIATION DOSE REDUCTION: This exam was performed according to the departmental dose-optimization program which includes automated exposure control, adjustment of the mA and/or kV according to patient size and/or use of iterative reconstruction technique. COMPARISON:  Head CT and cervical spine CT recently both on 05/13/2023. FINDINGS: CT HEAD FINDINGS Brain: There is mild global atrophy, mild atrophic ventriculomegaly and small-vessel disease of the cerebral white matter, chronic senescent mineralization in the basal ganglia. No new abnormality concerning for a cortical based acute infarct, hemorrhage, mass or mass effect is seen and no midline shift. Basal cisterns are clear. There have been no appreciable interval changes. Vascular: No hyperdense vessel or  unexpected calcification. Skull: Negative for fractures or focal lesions. Sinuses/Orbits: Patchy membrane thickening again noted in the ethmoid air cells with mild membrane disease in the maxillary sinuses. The frontal and sphenoid sinus, bilateral mastoid air cells, and middle ears are clear. The nasal septum is S shaped. Old lens extractions with otherwise negative orbits. Other: None. CT CERVICAL SPINE FINDINGS Alignment: Reversed cervical lordosis centered at C4-5, stable, with unchanged 3 mm grade 1 C4-5 anterolisthesis. No other listhesis is seen. Bone-on-bone anterior atlantodental joint space loss is also again noted, with osteophytes. Skull base and vertebrae: There is mild osteopenia without evidence of fractures, primary bone lesions or focal pathologic process. There is chronic ankylosis across the C4-5 and C5-6 facet joints. There are anterior bridging osteophytes C4-7. Soft tissues and spinal canal: No prevertebral fluid or swelling. No visible canal hematoma. There are minimal calcifications at the carotid bifurcations. No thyroid or laryngeal mass. Disc levels: There is chronic disc collapse C5-6, C6-7, C7-T1, normal disc heights above C5. There are dorsal disc osteophyte complexes C4-5 through C7-T1, associated with spinal canal stenosis and mild spondylotic cord compression from C4-5 through C6-7. The upper levels do not show significant soft tissue or bony encroachment on the spinal canal. There is multilevel facet joint and uncinate hypertrophy, with foraminal stenosis which is severe on the left at C2-3, bilaterally severe C3-4 through C5-6, bilaterally moderate to severe C6-7. Upper chest: Negative. Other: None. IMPRESSION: 1. No acute intracranial CT findings or depressed skull fractures. 2. Atrophy and small-vessel disease. 3. 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 4. Osteopenia and degenerative change without evidence of cervical fractures. 5. Reversed cervical lordosis with grade 1  C4-5 degenerative anterolisthesis. 6. Multilevel spinal canal and foraminal stenosis. 7. Carotid atherosclerosis. Electronically Signed   By: Almira Bar M.D.   On: 06/02/2023 07:03   CT Cervical Spine Wo Contrast Result Date: 06/02/2023 CLINICAL DATA:  The patient fell out of bed today sustaining head and neck trauma. EXAM: CT HEAD WITHOUT CONTRAST CT CERVICAL SPINE WITHOUT CONTRAST TECHNIQUE: Multidetector CT imaging of the head and cervical spine was performed following the standard protocol without intravenous contrast. Multiplanar CT image reconstructions of the cervical spine were also generated. RADIATION DOSE REDUCTION: This exam was performed according to the departmental dose-optimization program which includes automated exposure control, adjustment of the mA and/or kV according to patient size and/or use of iterative reconstruction technique. COMPARISON:  Head CT and cervical spine CT recently both on 05/13/2023. FINDINGS: CT HEAD FINDINGS Brain: There  is mild global atrophy, mild atrophic ventriculomegaly and small-vessel disease of the cerebral white matter, chronic senescent mineralization in the basal ganglia. No new abnormality concerning for a cortical based acute infarct, hemorrhage, mass or mass effect is seen and no midline shift. Basal cisterns are clear. There have been no appreciable interval changes. Vascular: No hyperdense vessel or unexpected calcification. Skull: Negative for fractures or focal lesions. Sinuses/Orbits: Patchy membrane thickening again noted in the ethmoid air cells with mild membrane disease in the maxillary sinuses. The frontal and sphenoid sinus, bilateral mastoid air cells, and middle ears are clear. The nasal septum is S shaped. Old lens extractions with otherwise negative orbits. Other: None. CT CERVICAL SPINE FINDINGS Alignment: Reversed cervical lordosis centered at C4-5, stable, with unchanged 3 mm grade 1 C4-5 anterolisthesis. No other listhesis is seen.  Bone-on-bone anterior atlantodental joint space loss is also again noted, with osteophytes. Skull base and vertebrae: There is mild osteopenia without evidence of fractures, primary bone lesions or focal pathologic process. There is chronic ankylosis across the C4-5 and C5-6 facet joints. There are anterior bridging osteophytes C4-7. Soft tissues and spinal canal: No prevertebral fluid or swelling. No visible canal hematoma. There are minimal calcifications at the carotid bifurcations. No thyroid or laryngeal mass. Disc levels: There is chronic disc collapse C5-6, C6-7, C7-T1, normal disc heights above C5. There are dorsal disc osteophyte complexes C4-5 through C7-T1, associated with spinal canal stenosis and mild spondylotic cord compression from C4-5 through C6-7. The upper levels do not show significant soft tissue or bony encroachment on the spinal canal. There is multilevel facet joint and uncinate hypertrophy, with foraminal stenosis which is severe on the left at C2-3, bilaterally severe C3-4 through C5-6, bilaterally moderate to severe C6-7. Upper chest: Negative. Other: None. IMPRESSION: 1. No acute intracranial CT findings or depressed skull fractures. 2. Atrophy and small-vessel disease. 3. 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 4. Osteopenia and degenerative change without evidence of cervical fractures. 5. Reversed cervical lordosis with grade 1 C4-5 degenerative anterolisthesis. 6. Multilevel spinal canal and foraminal stenosis. 7. Carotid atherosclerosis. Electronically Signed   By: Almira Bar M.D.   On: 06/02/2023 07:03   DG Chest Port 1 View Result Date: 06/02/2023 CLINICAL DATA:  72 year old male with possible sepsis. Fall, altered mental status. EXAM: PORTABLE CHEST 1 VIEW COMPARISON:  Portable chest 05/12/2023 and earlier. FINDINGS: Portable AP semi upright view at 0546 hours. Stable low lung volumes. More rotated to the left now. Stable cardiac size and mediastinal contours. Stable  lung volumes and ventilation. Calcified right midlung granuloma (no follow-up imaging recommended). No pneumothorax, pleural effusion, acute pulmonary opacity. Paucity of bowel gas the visible abdomen. No acute osseous abnormality identified. IMPRESSION: Chronically Low lung volumes. No acute cardiopulmonary abnormality or acute traumatic injury identified. Electronically Signed   By: Odessa Fleming M.D.   On: 06/02/2023 05:59    EKG: Independently reviewed.  NSR with rate 94; nonspecific ST changes with no evidence of acute ischemia. LBBB (new). Repeat EKG pending    Labs on Admission: I have personally reviewed the available labs and imaging studies at the time of the admission.  Pertinent labs:   wbc: 10.6,  hgb: 8.8,  platelets 110,  creatinine: 1.25,  Assessment and Plan: Principal Problem:   Septic shock from UTI Active Problems:   OSA treated with BiPAP   Acute respiratory failure with hypoxia (HCC)   Normocytic anemia   LBBB (left bundle branch block)   Thrombocytopenia (HCC)  Stage 3 chronic kidney disease (HCC)   HFrEF (heart failure with reduced ejection fraction) (HCC)   Bipolar affective disorder, depressed (HCC)   Benign prostatic hyperplasia with urinary obstruction   Dementia (HCC)   Tremor   Hyperlipidemia    Assessment and Plan: * Septic shock from UTI 72 year old presenting with unwitnessed falls, decreased PO intake and confusion found to be in septic shock from what appears to be a UTI -admit to progressive, ICU has been consulted in case does not respond to IVF resuscitation  -given 500cc in ED at time of admit. 2L have been ordered and running.  -BC/urine cultures pending -initial lactic acid wnl, repeat pending  -UA infected with frank hematuria from urethra, culture pending. 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 Will repeat CT abdomen/pelvis with contrast today with frank hematuria, but  antibiotics treatment for this. 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 Discussed with urology, will place foley and they will follow.  -adding on flagyl for anaerobic coverage  -general surgery consult, but likely will need this followed up outpatient once urosepsis resolves   OSA treated with BiPAP Intolerant to bipap   Acute respiratory failure with hypoxia (HCC) Noted to have oxygen to 88% on room air. Unsure if secondary to his OSA as supposed to wear bipap at night -no acute finding on CXR -no s/sx of volume overload/CHF exacerbation  -not tachycardic with no leg swelling to suggest PE -treat OSA, wean as tolerated   LBBB (left bundle branch block) -appears new -check troponin -recent echo -repeat EKG in AM   Normocytic anemia Recent baseline of 8.5 since December, stable, but downtrending since 05/2023 Type and screen B12/folate and iron studies done 05/2023 Monitor closely with frank hematuria   Thrombocytopenia (HCC) In setting of sepsis Will monitor, hold VTE chemoprophylaxis for now with low platelets + frank bleeding from meatus   Stage 3 chronic kidney disease (HCC) Baseline 1.1-1.2 Stable, continue to monitor   HFrEF (heart failure with reduced ejection fraction) (HCC) Recent echo 12/24: normal/low EF of 50-55%. Mild LVH. Normal diastolic function.  Watch volume with IVF resus Strict I/O Holding meds with septic shock   Bipolar affective disorder, depressed (HCC) Continue Abilify/Depakote/BuSpar/trazodone/Remeron  Benign prostatic hyperplasia with urinary obstruction Continue flomax   Dementia (HCC) Delirium and fall  precautions   Tremor Continue mysoline   Hyperlipidemia Continue pravachol     Advance Care Planning:   Code Status: Full Code   Consults: urology: Dr. Pete Glatter, general surgery: Dr. Azucena Cecil, PT   DVT Prophylaxis: SCDs  Family Communication: updated wife by phone.   Severity of Illness: The appropriate patient status for this patient is OBSERVATION. Observation status is judged to be reasonable and necessary in order to provide the required intensity of service to ensure the patient's safety. The patient's presenting symptoms, physical exam findings, and initial radiographic and laboratory data in the context of their medical condition is felt to place them at decreased risk for further clinical deterioration. Furthermore, it is anticipated that the patient will be medically stable for discharge from the hospital within 2 midnights of admission.  Author: Orland Mustard, MD 06/02/2023 12:55 PM  For on call review [email or web address].

## 2023-06-02 NOTE — Care Management Obs Status (Signed)
MEDICARE OBSERVATION STATUS NOTIFICATION   Patient Details  Name: Calvin Escobar MRN: [id number] Date of Birth: [birth year]   Medicare Observation Status Notification Given:  Yes    Lockie Pares, RN 06/02/2023, 10:18 AM

## 2023-06-02 NOTE — ED Triage Notes (Addendum)
Pt bib [facility]. Pt reported to have rolled out of bed this morning . Per ems Pt is a&oX1 , baseline is a&0X4.     18 G right wrist 18Left forearm   128/76 110 Pt is on carvedilol and lorsaton.  EMS  102.6 Temporal  4L applied. EMS 02 sat 92%

## 2023-06-02 NOTE — Assessment & Plan Note (Addendum)
72 year old presenting with unwitnessed falls, decreased PO intake and confusion found to be in septic shock from what appears to be a UTI -admit to progressive, ICU has been consulted in case does not respond to IVF resuscitation  -given 500cc in ED at time of admit. 2L have been ordered and running.  -BC/urine cultures pending -initial lactic acid wnl, repeat pending  -UA infected with frank hematuria from urethra, culture pending. 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 Will repeat CT abdomen/pelvis with contrast today with frank hematuria, but antibiotics treatment for this. 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 Discussed with urology, will place foley and they will follow.  -adding on flagyl for anaerobic coverage  -general surgery consult, but likely will need this followed up outpatient once urosepsis resolves

## 2023-06-02 NOTE — Assessment & Plan Note (Signed)
Recent baseline of 8.5 since December, stable, but downtrending since 05/2023 Type and screen B12/folate and iron studies done 05/2023 Monitor closely with frank hematuria

## 2023-06-02 NOTE — Assessment & Plan Note (Signed)
Delirium and fall precautions

## 2023-06-02 NOTE — Care Management (Signed)
Attempted to give MOON to Mrs Gerecke, left confidential message to call this RNCM back

## 2023-06-02 NOTE — Assessment & Plan Note (Signed)
Continue Abilify/Depakote/BuSpar/trazodone/Remeron

## 2023-06-02 NOTE — Assessment & Plan Note (Signed)
Continue flomax  

## 2023-06-02 NOTE — ED Notes (Signed)
ED TO INPATIENT HANDOFF REPORT  ED Nurse Name and Phone #: Vernona Rieger [phone]  S Name/Age/Gender Calvin Escobar 71 y.o. male Room/Bed: 045C/045C  Code Status   Code Status: Full Code  Home/SNF/Other Skilled nursing facility Patient oriented to: self and place Is this baseline? Yes   Triage Complete: Triage complete  Chief Complaint Sepsis due to gram-negative UTI (HCC) [A41.50, N39.0]  Triage Note Pt bib gcems. Pt reported to have rolled out of bed this morning . Per ems Pt is a&oX1 , baseline is a&0X4.     18 G right wrist 18Left forearm   128/76 110 Pt is on carvedilol and lorsaton.  EMS  102.6 Temporal  4L applied. EMS 02 sat 92%    Allergies No Known Allergies  Level of Care/Admitting Diagnosis ED Disposition     ED Disposition  Admit   Condition  --   Comment  Hospital Area: [facility] [[id number]]  Level of Care: Progressive [102]  Admit to Progressive based on following criteria: MULTISYSTEM THREATS such as stable sepsis, metabolic/electrolyte imbalance with or without encephalopathy that is responding to early treatment.  May place patient in observation at [facility] or [facility] if equivalent level of care is available:: Yes  Covid Evaluation: Confirmed COVID Negative  Diagnosis: Sepsis due to gram-negative UTI [facility]) [[id number]]  Admitting Physician: Orland Mustard [[id number]]  Attending Physician: Orland Mustard [[id number]]          B Medical/Surgery History Past Medical History:  Diagnosis Date   Allergy seasonal   Ascending aorta dilatation (HCC)    38 mm by 2D echo 04/2021   Cancer (HCC) skin   DCM (dilated cardiomyopathy) (HCC)    nonischemic with normal coronary arteries at cath 06/2019.  EF 35 to 40% on echo 04/2021   Dementia (HCC)    Depression    Hyperlipidemia    Lithium toxicity 01/29/2016   OSA treated with BiPAP    SKIN CANCER, HX OF 05/10/2007   Facial left check and forehead and r shoulder  F/w derm   Urinary  dribbling 03/14/2022   Past Surgical History:  Procedure Laterality Date   COLONOSCOPY     RIGHT/LEFT HEART CATH AND CORONARY ANGIOGRAPHY N/A 07/03/2019   Procedure: RIGHT/LEFT HEART CATH AND CORONARY ANGIOGRAPHY;  Surgeon: Dolores Patty, MD;  Location: [facility] INVASIVE CV LAB;  Service: Cardiovascular;  Laterality: N/A;     A IV Location/Drains/Wounds Patient Lines/Drains/Airways Status     Active Line/Drains/Airways     Name Placement date Placement time Site Days   Peripheral IV 06/02/23 18 G Anterior;Left Forearm 06/02/23  --  Forearm  less than 1   Peripheral IV 06/02/23 20 G Right Antecubital 06/02/23  1028  Antecubital  less than 1            Intake/Output Last 24 hours  Intake/Output Summary (Last 24 hours) at 06/02/2023 1238 Last data filed at 06/02/2023 1057 Gross per 24 hour  Intake 1100 ml  Output --  Net 1100 ml    Labs/Imaging Results for orders placed or performed during the hospital encounter of 06/02/23 (from the past 48 hours)  Urinalysis, w/ Reflex to Culture (Infection Suspected) -Urine, Clean Catch     Status: Abnormal   Collection Time: 06/02/23  5:21 AM  Result Value Ref Range   Specimen Source URINE, CLEAN CATCH    Color, Urine YELLOW YELLOW   APPearance CLOUDY (A) CLEAR   Specific Gravity, Urine 1.013 1.005 - 1.030   pH  5.0 5.0 - 8.0   Glucose, UA NEGATIVE NEGATIVE mg/dL   Hgb urine dipstick MODERATE (A) NEGATIVE   Bilirubin Urine NEGATIVE NEGATIVE   Ketones, ur NEGATIVE NEGATIVE mg/dL   Protein, ur 284 (A) NEGATIVE mg/dL   Nitrite POSITIVE (A) NEGATIVE   Leukocytes,Ua LARGE (A) NEGATIVE   RBC / HPF 21-50 0 - 5 RBC/hpf   WBC, UA >50 0 - 5 WBC/hpf    Comment:        Reflex urine culture not performed if WBC <=10, OR if Squamous epithelial cells >5. If Squamous epithelial cells >5 suggest recollection.    Bacteria, UA FEW (A) NONE SEEN   Squamous Epithelial / HPF 0-5 0 - 5 /HPF   Mucus PRESENT     Comment: Performed at [facility], [address], [place], [place] [address]  I-Stat Lactic Acid, ED     Status: None   Collection Time: 06/02/23  5:22 AM  Result Value Ref Range   Lactic Acid, Venous 0.9 0.5 - 1.9 mmol/L  I-Stat Chem 8, ED     Status: Abnormal   Collection Time: 06/02/23  5:22 AM  Result Value Ref Range   Sodium 137 135 - 145 mmol/L   Potassium 4.1 3.5 - 5.1 mmol/L   Chloride 102 98 - 111 mmol/L   BUN 16 8 - 23 mg/dL   Creatinine, Ser 0.10 (H) 0.61 - 1.24 mg/dL   Glucose, Bld 272 (H) 70 - 99 mg/dL    Comment: Glucose reference range applies only to samples taken after fasting for at least 8 hours.   Calcium, Ion 1.05 (L) 1.15 - 1.40 mmol/L   TCO2 24 22 - 32 mmol/L   Hemoglobin 8.8 (L) 13.0 - 17.0 g/dL   HCT 53.6 (L) 64.4 - 03.4 %  Comprehensive metabolic panel     Status: Abnormal   Collection Time: 06/02/23  5:24 AM  Result Value Ref Range   Sodium 137 135 - 145 mmol/L   Potassium 4.0 3.5 - 5.1 mmol/L   Chloride 103 98 - 111 mmol/L   CO2 24 22 - 32 mmol/L   Glucose, Bld 153 (H) 70 - 99 mg/dL    Comment: Glucose reference range applies only to samples taken after fasting for at least 8 hours.   BUN 15 8 - 23 mg/dL   Creatinine, Ser 7.42 (H) 0.61 - 1.24 mg/dL   Calcium 8.3 (L) 8.9 - 10.3 mg/dL   Total Protein 6.5 6.5 - 8.1 g/dL   Albumin 2.6 (L) 3.5 - 5.0 g/dL   AST 18 15 - 41 U/L   ALT 10 0 - 44 U/L   Alkaline Phosphatase 51 38 - 126 U/L   Total Bilirubin 0.5 <1.2 mg/dL   GFR, Estimated >59 >56 mL/min    Comment: (NOTE) Calculated using the CKD-EPI Creatinine Equation (2021)    Anion gap 10 5 - 15    Comment: Performed at [facility], [address] [place], [place] [address]  CBC with Differential     Status: Abnormal   Collection Time: 06/02/23  5:24 AM  Result Value Ref Range   WBC 10.6 (H) 4.0 - 10.5 K/uL   RBC 3.20 (L) 4.22 - 5.81 MIL/uL   Hemoglobin 8.8 (L) 13.0 - 17.0 g/dL   HCT 43.3 (L) 29.5 - 18.8 %   MCV 89.1 80.0 - 100.0 fL   MCH 27.5 26.0 - 34.0 pg    MCHC 30.9 30.0 - 36.0  g/dL   RDW 40.3 (H) 47.4 - 25.9 %   Platelets 110 (L) 150 - 400 K/uL   nRBC 0.0 0.0 - 0.2 %   Neutrophils Relative % 77 %   Neutro Abs 8.2 (H) 1.7 - 7.7 K/uL   Lymphocytes Relative 8 %   Lymphs Abs 0.9 0.7 - 4.0 K/uL   Monocytes Relative 13 %   Monocytes Absolute 1.4 (H) 0.1 - 1.0 K/uL   Eosinophils Relative 0 %   Eosinophils Absolute 0.0 0.0 - 0.5 K/uL   Basophils Relative 1 %   Basophils Absolute 0.1 0.0 - 0.1 K/uL   Immature Granulocytes 1 %   Abs Immature Granulocytes 0.07 0.00 - 0.07 K/uL    Comment: Performed at [facility], [address] [place], [place] [address]  Protime-INR     Status: None   Collection Time: 06/02/23  5:24 AM  Result Value Ref Range   Prothrombin Time 15.0 11.4 - 15.2 seconds   INR 1.2 0.8 - 1.2    Comment: (NOTE) INR goal varies based on device and disease states. Performed at [facility], [address], [place], [place] [address]   APTT     Status: None   Collection Time: 06/02/23  5:24 AM  Result Value Ref Range   aPTT 30 24 - 36 seconds    Comment: Performed at [facility], [address], [place], [place] [address]  Procalcitonin     Status: None   Collection Time: 06/02/23  5:24 AM  Result Value Ref Range   Procalcitonin 0.21 ng/mL    Comment:        Interpretation: PCT (Procalcitonin) <= 0.5 ng/mL: Systemic infection (sepsis) is not likely. Local bacterial infection is possible. (NOTE)       Sepsis PCT Algorithm           Lower Respiratory Tract                                      Infection PCT Algorithm    ----------------------------     ----------------------------         PCT < 0.25 ng/mL                PCT < 0.10 ng/mL          Strongly encourage             Strongly discourage   discontinuation of antibiotics    initiation of antibiotics    ----------------------------     -----------------------------       PCT 0.25 - 0.50 ng/mL            PCT 0.10 - 0.25 ng/mL                OR       >80% decrease in PCT            Discourage initiation of                                            antibiotics      Encourage discontinuation           of antibiotics    ----------------------------     -----------------------------         PCT >=  0.50 ng/mL              PCT 0.26 - 0.50 ng/mL               AND        <80% decrease in PCT             Encourage initiation of                                             antibiotics       Encourage continuation           of antibiotics    ----------------------------     -----------------------------        PCT >= 0.50 ng/mL                  PCT > 0.50 ng/mL               AND         increase in PCT                  Strongly encourage                                      initiation of antibiotics    Strongly encourage escalation           of antibiotics                                     -----------------------------                                           PCT <= 0.25 ng/mL                                                 OR                                        > 80% decrease in PCT                                      Discontinue / Do not initiate                                             antibiotics  Performed at [facility], [address] [place], [place] [address]   Resp panel by RT-PCR (RSV, Flu A&B, Covid) Anterior Nasal Swab     Status: None   Collection Time: 06/02/23  5:58 AM   Specimen: Anterior Nasal Swab  Result Value Ref Range   SARS Coronavirus 2 by RT PCR NEGATIVE NEGATIVE   Influenza A by  PCR NEGATIVE NEGATIVE   Influenza B by PCR NEGATIVE NEGATIVE    Comment: (NOTE) The Xpert Xpress SARS-CoV-2/FLU/RSV plus assay is intended as an aid in the diagnosis of influenza from Nasopharyngeal swab specimens and should not be used as a sole basis for treatment. Nasal washings and aspirates are unacceptable for Xpert Xpress SARS-CoV-2/FLU/RSV testing.  Fact Sheet for  Patients: BloggerCourse.com  Fact Sheet for Healthcare Providers: SeriousBroker.it  This test is not yet approved or cleared by the [place] FDA and has been authorized for detection and/or diagnosis of SARS-CoV-2 by FDA under an Emergency Use Authorization (EUA). This EUA will remain in effect (meaning this test can be used) for the duration of the COVID-19 declaration under Section 564(b)(1) of the Act, 21 U.S.C. section 360bbb-3(b)(1), unless the authorization is terminated or revoked.     Resp Syncytial Virus by PCR NEGATIVE NEGATIVE    Comment: (NOTE) Fact Sheet for Patients: BloggerCourse.com  Fact Sheet for Healthcare Providers: SeriousBroker.it  This test is not yet approved or cleared by the [place] FDA and has been authorized for detection and/or diagnosis of SARS-CoV-2 by FDA under an Emergency Use Authorization (EUA). This EUA will remain in effect (meaning this test can be used) for the duration of the COVID-19 declaration under Section 564(b)(1) of the Act, 21 U.S.C. section 360bbb-3(b)(1), unless the authorization is terminated or revoked.  Performed at [facility], [address], [place], [place] [address]   Troponin I (High Sensitivity)     Status: Abnormal   Collection Time: 06/02/23  9:28 AM  Result Value Ref Range   Troponin I (High Sensitivity) 22 (H) <18 ng/L    Comment: (NOTE) Elevated high sensitivity troponin I (hsTnI) values and significant  changes across serial measurements may suggest ACS but many other  chronic and acute conditions are known to elevate hsTnI results.  Refer to the "Links" section for chest pain algorithms and additional  guidance. Performed at [facility], [address] [place], [place] [address]   CK     Status: None   Collection Time: 06/02/23  9:28 AM  Result Value Ref Range   Total CK 291 49 -  397 U/L    Comment: Performed at [facility], [address], [place], [place] [address]  I-Stat Lactic Acid, ED     Status: Abnormal   Collection Time: 06/02/23  9:51 AM  Result Value Ref Range   Lactic Acid, Venous 2.3 (HH) 0.5 - 1.9 mmol/L   Comment NOTIFIED PHYSICIAN   I-stat chem 8, ed     Status: Abnormal   Collection Time: 06/02/23  9:56 AM  Result Value Ref Range   Sodium 138 135 - 145 mmol/L   Potassium 4.0 3.5 - 5.1 mmol/L   Chloride 102 98 - 111 mmol/L   BUN 19 8 - 23 mg/dL   Creatinine, Ser 9.14 (H) 0.61 - 1.24 mg/dL   Glucose, Bld 782 (H) 70 - 99 mg/dL    Comment: Glucose reference range applies only to samples taken after fasting for at least 8 hours.   Calcium, Ion 1.13 (L) 1.15 - 1.40 mmol/L   TCO2 27 22 - 32 mmol/L   Hemoglobin 8.2 (L) 13.0 - 17.0 g/dL   HCT 95.6 (L) 21.3 - 08.6 %   CT ABDOMEN PELVIS W WO CONTRAST Result Date: 06/02/2023 CLINICAL DATA:  72 year old male with history of dementia. Suspected urolithiasis. EXAM: CT ABDOMEN AND PELVIS WITHOUT AND WITH CONTRAST  TECHNIQUE: Multidetector CT imaging of the abdomen and pelvis was performed following the standard protocol before and following the bolus administration of intravenous contrast. RADIATION DOSE REDUCTION: This exam was performed according to the departmental dose-optimization program which includes automated exposure control, adjustment of the mA and/or kV according to patient size and/or use of iterative reconstruction technique. CONTRAST:  75mL OMNIPAQUE IOHEXOL 350 MG/ML SOLN COMPARISON:  CT of the abdomen and pelvis 05/16/2023. FINDINGS: Lower chest: Calcified granuloma in the right middle lobe. Atherosclerotic calcifications in the left main, left anterior descending and right coronary arteries. Hepatobiliary: No definite suspicious cystic or solid hepatic lesions are confidently identified on today's noncontrast CT examination. Unenhanced appearance of the gallbladder is unremarkable. Pancreas:  No definite pancreatic mass or peripancreatic fluid collections or inflammatory changes are noted on today's noncontrast CT examination. Spleen: Unremarkable. Adrenals/Urinary Tract: There is a large amount of gas present in the nondependent aspect of the urinary bladder. There also appears to be some debris dependently in the urinary bladder, which also contains a small amount of gas (best appreciated on axial image 91 of series 3). Superior aspect of the urinary bladder appears thickened and intimately associated with the adjacent sigmoid colon, centered around what appears to be a large diverticulum, best appreciated on coronal image 56 of series 10. Small amount of gas in the superior wall of the urinary bladder (coronal image 57 of series 10). Moderate right and mild left hydroureteronephrosis. Small amount of gas within the collecting systems of both kidneys (right greater than left). Unenhanced appearance of the kidneys and bilateral adrenal glands is otherwise unremarkable. Stomach/Bowel: Unenhanced appearance of the stomach is normal. No pathologic dilatation of small bowel or colon. 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 Normal appendix. Vascular/Lymphatic: Atherosclerosis in the abdominal aorta and pelvic vasculature. No lymphadenopathy noted in the abdomen or pelvis. Reproductive: Prostate gland and seminal vesicles are unremarkable in appearance. Other: No significant volume of ascites.  No pneumoperitoneum. Musculoskeletal: There are no aggressive appearing lytic or blastic lesions noted in the visualized portions of the skeleton. IMPRESSION: 1. 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 Urologic  consultation is recommended for further clinical evaluation. 2. Aortic atherosclerosis. Electronically Signed   By: Trudie Reed M.D.   On: 06/02/2023 10:19   CT Head Wo Contrast Result Date: 06/02/2023 CLINICAL DATA:  The patient fell out of bed today sustaining head and neck trauma. EXAM: CT HEAD WITHOUT CONTRAST CT CERVICAL SPINE WITHOUT CONTRAST TECHNIQUE: Multidetector CT imaging of the head and cervical spine was performed following the standard protocol without intravenous contrast. Multiplanar CT image reconstructions of the cervical spine were also generated. RADIATION DOSE REDUCTION: This exam was performed according to the departmental dose-optimization program which includes automated exposure control, adjustment of the mA and/or kV according to patient size and/or use of iterative reconstruction technique. COMPARISON:  Head CT and cervical spine CT recently both on 05/13/2023. FINDINGS: CT HEAD FINDINGS Brain: There is mild global atrophy, mild atrophic ventriculomegaly and small-vessel disease of the cerebral white matter, chronic senescent mineralization in the basal ganglia. No new abnormality concerning for a cortical based acute infarct, hemorrhage, mass or mass effect is seen and no midline shift. Basal cisterns are clear. There have been no appreciable interval  changes. Vascular: No hyperdense vessel or unexpected calcification. Skull: Negative for fractures or focal lesions. Sinuses/Orbits: Patchy membrane thickening again noted in the ethmoid air cells with mild membrane disease in the maxillary sinuses. The frontal and sphenoid sinus, bilateral mastoid air cells, and middle ears are clear. The nasal septum is S shaped. Old lens extractions with otherwise negative orbits. Other: None. CT CERVICAL SPINE FINDINGS Alignment: Reversed cervical lordosis centered at C4-5, stable, with unchanged 3 mm grade 1 C4-5 anterolisthesis. No other listhesis is seen. Bone-on-bone anterior atlantodental  joint space loss is also again noted, with osteophytes. Skull base and vertebrae: There is mild osteopenia without evidence of fractures, primary bone lesions or focal pathologic process. There is chronic ankylosis across the C4-5 and C5-6 facet joints. There are anterior bridging osteophytes C4-7. Soft tissues and spinal canal: No prevertebral fluid or swelling. No visible canal hematoma. There are minimal calcifications at the carotid bifurcations. No thyroid or laryngeal mass. Disc levels: There is chronic disc collapse C5-6, C6-7, C7-T1, normal disc heights above C5. There are dorsal disc osteophyte complexes C4-5 through C7-T1, associated with spinal canal stenosis and mild spondylotic cord compression from C4-5 through C6-7. The upper levels do not show significant soft tissue or bony encroachment on the spinal canal. There is multilevel facet joint and uncinate hypertrophy, with foraminal stenosis which is severe on the left at C2-3, bilaterally severe C3-4 through C5-6, bilaterally moderate to severe C6-7. Upper chest: Negative. Other: None. IMPRESSION: 1. No acute intracranial CT findings or depressed skull fractures. 2. Atrophy and small-vessel disease. 3. 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 4. Osteopenia and degenerative change without evidence of cervical fractures. 5. Reversed cervical lordosis with grade 1 C4-5 degenerative anterolisthesis. 6. Multilevel spinal canal and foraminal stenosis. 7. Carotid atherosclerosis. Electronically Signed   By: Almira Bar M.D.   On: 06/02/2023 07:03   CT Cervical Spine Wo Contrast Result Date: 06/02/2023 CLINICAL DATA:  The patient fell out of bed today sustaining head and neck trauma. EXAM: CT HEAD WITHOUT CONTRAST CT CERVICAL SPINE WITHOUT CONTRAST TECHNIQUE: Multidetector CT imaging of the head and cervical spine was performed following the standard protocol without intravenous contrast. Multiplanar CT image reconstructions of the cervical spine  were also generated. RADIATION DOSE REDUCTION: This exam was performed according to the departmental dose-optimization program which includes automated exposure control, adjustment of the mA and/or kV according to patient size and/or use of iterative reconstruction technique. COMPARISON:  Head CT and cervical spine CT recently both on 05/13/2023. FINDINGS: CT HEAD FINDINGS Brain: There is mild global atrophy, mild atrophic ventriculomegaly and small-vessel disease of the cerebral white matter, chronic senescent mineralization in the basal ganglia. No new abnormality concerning for a cortical based acute infarct, hemorrhage, mass or mass effect is seen and no midline shift. Basal cisterns are clear. There have been no appreciable interval changes. Vascular: No hyperdense vessel or unexpected calcification. Skull: Negative for fractures or focal lesions. Sinuses/Orbits: Patchy membrane thickening again noted in the ethmoid air cells with mild membrane disease in the maxillary sinuses. The frontal and sphenoid sinus, bilateral mastoid air cells, and middle ears are clear. The nasal septum is S shaped. Old lens extractions with otherwise negative orbits. Other: None. CT CERVICAL SPINE FINDINGS Alignment: Reversed cervical lordosis centered at C4-5, stable, with unchanged 3 mm grade 1 C4-5 anterolisthesis. No other listhesis is seen. Bone-on-bone anterior atlantodental joint space loss is also again noted, with osteophytes. Skull base and vertebrae: There is mild osteopenia without evidence  of fractures, primary bone lesions or focal pathologic process. There is chronic ankylosis across the C4-5 and C5-6 facet joints. There are anterior bridging osteophytes C4-7. Soft tissues and spinal canal: No prevertebral fluid or swelling. No visible canal hematoma. There are minimal calcifications at the carotid bifurcations. No thyroid or laryngeal mass. Disc levels: There is chronic disc collapse C5-6, C6-7, C7-T1, normal disc  heights above C5. There are dorsal disc osteophyte complexes C4-5 through C7-T1, associated with spinal canal stenosis and mild spondylotic cord compression from C4-5 through C6-7. The upper levels do not show significant soft tissue or bony encroachment on the spinal canal. There is multilevel facet joint and uncinate hypertrophy, with foraminal stenosis which is severe on the left at C2-3, bilaterally severe C3-4 through C5-6, bilaterally moderate to severe C6-7. Upper chest: Negative. Other: None. IMPRESSION: 1. No acute intracranial CT findings or depressed skull fractures. 2. Atrophy and small-vessel disease. 3. 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 4. Osteopenia and degenerative change without evidence of cervical fractures. 5. Reversed cervical lordosis with grade 1 C4-5 degenerative anterolisthesis. 6. Multilevel spinal canal and foraminal stenosis. 7. Carotid atherosclerosis. Electronically Signed   By: Almira Bar M.D.   On: 06/02/2023 07:03   DG Chest Port 1 View Result Date: 06/02/2023 CLINICAL DATA:  72 year old male with possible sepsis. Fall, altered mental status. EXAM: PORTABLE CHEST 1 VIEW COMPARISON:  Portable chest 05/12/2023 and earlier. FINDINGS: Portable AP semi upright view at 0546 hours. Stable low lung volumes. More rotated to the left now. Stable cardiac size and mediastinal contours. Stable lung volumes and ventilation. Calcified right midlung granuloma (no follow-up imaging recommended). No pneumothorax, pleural effusion, acute pulmonary opacity. Paucity of bowel gas the visible abdomen. No acute osseous abnormality identified. IMPRESSION: Chronically Low lung volumes. No acute cardiopulmonary abnormality or acute traumatic injury identified. Electronically Signed   By: Odessa Fleming M.D.   On: 06/02/2023 05:59    Pending Labs Unresulted Labs (From admission, onward)     Start     Ordered   06/03/23 0500  Comprehensive metabolic panel  Tomorrow morning,   R         06/02/23 0912   06/03/23 0500  CBC  Tomorrow morning,   R        06/02/23 0912   06/02/23 0827  Procalcitonin  Daily,   R     References:    Procalcitonin Lower Respiratory Tract Infection AND Sepsis Procalcitonin Algorithm   06/02/23 0826   06/02/23 0521  Blood Culture (routine x 2)  (Undifferentiated presentation (screening labs and basic nursing orders))  BLOOD CULTURE X 2,   STAT      06/02/23 0520   06/02/23 0521  Urine Culture  Once,   R        06/02/23 0521            Vitals/Pain Today's Vitals   06/02/23 0930 06/02/23 1005 06/02/23 1010 06/02/23 1050  BP: (!) 80/52 97/66  (!) 159/147  Pulse: 66 65 67 99  Resp: (!) 26 (!) 22 (!) 22 (!) 22  Temp:   99.1 F (37.3 C)   TempSrc:   Axillary   SpO2: 100% 98% 100% 98%  Weight:      Height:      PainSc:        Isolation Precautions No active isolations  Medications Medications  enoxaparin (LOVENOX) injection 40 mg (40 mg Subcutaneous Given 06/02/23 1013)  acetaminophen (TYLENOL) tablet 650 mg (has no administration in  time range)    Or  acetaminophen (TYLENOL) suppository 650 mg (has no administration in time range)  ondansetron (ZOFRAN) tablet 4 mg (has no administration in time range)    Or  ondansetron (ZOFRAN) injection 4 mg (has no administration in time range)  lactated ringers infusion ( Intravenous New Bag/Given 06/02/23 1059)  cefTRIAXone (ROCEPHIN) 2 g in sodium chloride 0.9 % 100 mL IVPB (has no administration in time range)  metroNIDAZOLE (FLAGYL) IVPB 500 mg (500 mg Intravenous New Bag/Given 06/02/23 1233)  sodium chloride 0.9 % bolus 500 mL (0 mLs Intravenous Stopped 06/02/23 1011)  aztreonam (AZACTAM) 2 g in sodium chloride 0.9 % 100 mL IVPB (0 g Intravenous Stopped 06/02/23 0647)  acetaminophen (TYLENOL) tablet 1,000 mg (1,000 mg Oral Given 06/02/23 0730)  sodium chloride 0.9 % bolus 1,000 mL (0 mLs Intravenous Stopped 06/02/23 1011)  lactated ringers bolus 1,000 mL (0 mLs Intravenous Stopped 06/02/23  1057)  iohexol (OMNIPAQUE) 350 MG/ML injection 75 mL (75 mLs Intravenous Contrast Given 06/02/23 0949)    Mobility walks with person assist     Focused Assessments Cardiac Assessment Handoff:  Cardiac Rhythm: Normal sinus rhythm, Bundle branch block Lab Results  Component Value Date   CKTOTAL 291 06/02/2023   No results found for: "DDIMER" Does the Patient currently have chest pain? No    R Recommendations: See Admitting Provider Note  Report given to:   Additional Notes: Foley insertion

## 2023-06-02 NOTE — Consult Note (Signed)
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 seen in consultation from Dr. 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  He has a history of BPH and urinary incontinence and has been followed by Dr. Alvester Morin.  He was recently admitted to the hospital in early December 2020 for for sepsis secondary to E. coli bacteremia and a complicated E. coli UTI.  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  He underwent evaluation with cystoscopy by Dr. Alvester Morin on 05/22/2023 which showed an obstructive prostate gland with a large median lobe and severe bladder trabeculations.  Further evaluation with urodynamics and transrectal ultrasound recommended.  He presented to the emergency room this morning after a fall.  He has also had decreased oral intake.  Urinalysis was nitrite positive with 21-50 RBCs, >50 WBCs, and few bacteria.  He met sepsis criteria and was admitted to the hospital. He denies any gross hematuria, dysuria, or flank pain.  CT abdomen and pelvis showed large amount of gas in the urinary bladder with some debris dependently in the bladder containing a small amount of gas, thickening of the superior aspect of the bladder in association with the adjacent sigmoid colon around what appears to be a large diverticulum suggestive of a possible colovesical fistula; small amount of gas in the collecting systems bilaterally and moderate right and mild left hydronephrosis.   Past Medical History:  Diagnosis Date   Allergy seasonal   Ascending aorta dilatation (HCC)    38 mm by 2D echo 04/2021   Cancer (HCC) skin   DCM (dilated cardiomyopathy) (HCC)    nonischemic with normal coronary arteries at cath 06/2019.  EF 35 to 40% on echo 04/2021   Dementia (HCC)    Depression    Hyperlipidemia    Lithium toxicity 01/29/2016   OSA treated with BiPAP     SKIN CANCER, HX OF 05/10/2007   Facial left check and forehead and r shoulder  F/w derm   Urinary dribbling 03/14/2022    Past Surgical History:  Procedure Laterality Date   COLONOSCOPY     RIGHT/LEFT HEART CATH AND CORONARY ANGIOGRAPHY N/A 07/03/2019   Procedure: RIGHT/LEFT HEART CATH AND CORONARY ANGIOGRAPHY;  Surgeon: Dolores Patty, MD;  Location: [facility];  Service: Cardiovascular;  Laterality: N/A;    Medications:  Scheduled Meds:  ARIPiprazole  2 mg Oral Daily   busPIRone  10 mg Oral TID   divalproex  500 mg Oral QHS   folic acid  1 mg Oral Daily   gabapentin  300 mg Oral QHS   mirtazapine  30 mg Oral QHS   pravastatin  20 mg Oral QHS   primidone  50 mg Oral BID   tamsulosin  0.4 mg Oral Daily   traZODone  300 mg Oral QHS   [START ON 06/03/2023] venlafaxine XR  300 mg Oral Q breakfast   Continuous Infusions:  lactated ringers 100 mL/hr at 06/02/23 1706   piperacillin-tazobactam (ZOSYN)  IV     PRN Meds:.acetaminophen **OR** acetaminophen, ondansetron **OR** ondansetron (ZOFRAN) IV, mouth rinse  Allergies: No Known Allergies  Family History  Problem Relation Age of Onset   Hypertension Mother    Cancer Father    Bone cancer Sister     Social History:  reports that he has never smoked. He has never used smokeless tobacco. He reports  current alcohol use of about 14.0 standard drinks of alcohol per week. He reports that he does not use drugs.  ROS: A complete review of systems was performed.  All systems are negative except for pertinent findings as noted.  Physical Exam:  Vital signs in last 24 hours: Temp:  [99 F (37.2 C)-102.8 F (39.3 C)] 102.8 F (39.3 C) (12/29 1653) Pulse Rate:  [65-99] 89 (12/29 1400) Resp:  [20-33] 25 (12/29 1700) BP: (75-159)/(43-147) 125/68 (12/29 1400) SpO2:  [88 %-100 %] 100 % (12/29 1400) Weight:  [109.3 kg] 109.3 kg (12/29 0531) GENERAL APPEARANCE:  Well appearing, well developed, well nourished,  NAD HEENT:  Atraumatic, normocephalic, oropharynx clear NECK:  Supple  ABDOMEN:  Soft, non-tender, no masses EXTREMITIES:  Moves all extremities well, without clubbing, cyanosis, or edema NEUROLOGIC:  Alert and oriented x 3, CN II-XII grossly intact MENTAL STATUS:  appropriate BACK:  Non-tender to palpation, No CVAT SKIN:  Warm, dry, and intact   Laboratory Data:  Recent Labs    06/02/23 0522 06/02/23 0524 06/02/23 0956 06/02/23 1523  WBC  --  10.6*  --  9.8  HGB 8.8* 8.8* 8.2* 8.9*  HCT 26.0* 28.5* 24.0* 29.7*  PLT  --  110*  --  111*    Recent Labs    06/02/23 0522 06/02/23 0524 06/02/23 0956  NA 137 137 138  K 4.1 4.0 4.0  CL 102 103 102  GLUCOSE 153* 153* 124*  BUN 16 15 19   CALCIUM  --  8.3*  --   CREATININE 1.30* 1.25* 1.30*     Results for orders placed or performed during the hospital encounter of 06/02/23 (from the past 24 hours)  Urinalysis, w/ Reflex to Culture (Infection Suspected) -Urine, Clean Catch     Status: Abnormal   Collection Time: 06/02/23  5:21 AM  Result Value Ref Range   Specimen Source URINE, CLEAN CATCH    Color, Urine YELLOW YELLOW   APPearance CLOUDY (A) CLEAR   Specific Gravity, Urine 1.013 1.005 - 1.030   pH 5.0 5.0 - 8.0   Glucose, UA NEGATIVE NEGATIVE mg/dL   Hgb urine dipstick MODERATE (A) NEGATIVE   Bilirubin Urine NEGATIVE NEGATIVE   Ketones, ur NEGATIVE NEGATIVE mg/dL   Protein, ur 161 (A) NEGATIVE mg/dL   Nitrite POSITIVE (A) NEGATIVE   Leukocytes,Ua LARGE (A) NEGATIVE   RBC / HPF 21-50 0 - 5 RBC/hpf   WBC, UA >50 0 - 5 WBC/hpf   Bacteria, UA FEW (A) NONE SEEN   Squamous Epithelial / HPF 0-5 0 - 5 /HPF   Mucus PRESENT   I-Stat Lactic Acid, ED     Status: None   Collection Time: 06/02/23  5:22 AM  Result Value Ref Range   Lactic Acid, Venous 0.9 0.5 - 1.9 mmol/L  I-Stat Chem 8, ED     Status: Abnormal   Collection Time: 06/02/23  5:22 AM  Result Value Ref Range   Sodium 137 135 - 145 mmol/L   Potassium 4.1 3.5 -  5.1 mmol/L   Chloride 102 98 - 111 mmol/L   BUN 16 8 - 23 mg/dL   Creatinine, Ser 0.96 (H) 0.61 - 1.24 mg/dL   Glucose, Bld 045 (H) 70 - 99 mg/dL   Calcium, Ion 4.09 (L) 1.15 - 1.40 mmol/L   TCO2 24 22 - 32 mmol/L   Hemoglobin 8.8 (L) 13.0 - 17.0 g/dL   HCT 81.1 (L) 91.4 - 78.2 %  Comprehensive metabolic panel  Status: Abnormal   Collection Time: 06/02/23  5:24 AM  Result Value Ref Range   Sodium 137 135 - 145 mmol/L   Potassium 4.0 3.5 - 5.1 mmol/L   Chloride 103 98 - 111 mmol/L   CO2 24 22 - 32 mmol/L   Glucose, Bld 153 (H) 70 - 99 mg/dL   BUN 15 8 - 23 mg/dL   Creatinine, Ser 1.61 (H) 0.61 - 1.24 mg/dL   Calcium 8.3 (L) 8.9 - 10.3 mg/dL   Total Protein 6.5 6.5 - 8.1 g/dL   Albumin 2.6 (L) 3.5 - 5.0 g/dL   AST 18 15 - 41 U/L   ALT 10 0 - 44 U/L   Alkaline Phosphatase 51 38 - 126 U/L   Total Bilirubin 0.5 <1.2 mg/dL   GFR, Estimated >09 >60 mL/min   Anion gap 10 5 - 15  CBC with Differential     Status: Abnormal   Collection Time: 06/02/23  5:24 AM  Result Value Ref Range   WBC 10.6 (H) 4.0 - 10.5 K/uL   RBC 3.20 (L) 4.22 - 5.81 MIL/uL   Hemoglobin 8.8 (L) 13.0 - 17.0 g/dL   HCT 45.4 (L) 09.8 - 11.9 %   MCV 89.1 80.0 - 100.0 fL   MCH 27.5 26.0 - 34.0 pg   MCHC 30.9 30.0 - 36.0 g/dL   RDW 14.7 (H) 82.9 - 56.2 %   Platelets 110 (L) 150 - 400 K/uL   nRBC 0.0 0.0 - 0.2 %   Neutrophils Relative % 77 %   Neutro Abs 8.2 (H) 1.7 - 7.7 K/uL   Lymphocytes Relative 8 %   Lymphs Abs 0.9 0.7 - 4.0 K/uL   Monocytes Relative 13 %   Monocytes Absolute 1.4 (H) 0.1 - 1.0 K/uL   Eosinophils Relative 0 %   Eosinophils Absolute 0.0 0.0 - 0.5 K/uL   Basophils Relative 1 %   Basophils Absolute 0.1 0.0 - 0.1 K/uL   Immature Granulocytes 1 %   Abs Immature Granulocytes 0.07 0.00 - 0.07 K/uL  Protime-INR     Status: None   Collection Time: 06/02/23  5:24 AM  Result Value Ref Range   Prothrombin Time 15.0 11.4 - 15.2 seconds   INR 1.2 0.8 - 1.2  APTT     Status: None   Collection  Time: 06/02/23  5:24 AM  Result Value Ref Range   aPTT 30 24 - 36 seconds  Procalcitonin     Status: None   Collection Time: 06/02/23  5:24 AM  Result Value Ref Range   Procalcitonin 0.21 ng/mL  Resp panel by RT-PCR (RSV, Flu A&B, Covid) Anterior Nasal Swab     Status: None   Collection Time: 06/02/23  5:58 AM   Specimen: Anterior Nasal Swab  Result Value Ref Range   SARS Coronavirus 2 by RT PCR NEGATIVE NEGATIVE   Influenza A by PCR NEGATIVE NEGATIVE   Influenza B by PCR NEGATIVE NEGATIVE   Resp Syncytial Virus by PCR NEGATIVE NEGATIVE  Troponin I (High Sensitivity)     Status: Abnormal   Collection Time: 06/02/23  9:28 AM  Result Value Ref Range   Troponin I (High Sensitivity) 22 (H) <18 ng/L  CK     Status: None   Collection Time: 06/02/23  9:28 AM  Result Value Ref Range   Total CK 291 49 - 397 U/L  I-Stat Lactic Acid, ED     Status: Abnormal   Collection Time: 06/02/23  9:51 AM  Result Value Ref Range   Lactic Acid, Venous 2.3 (HH) 0.5 - 1.9 mmol/L   Comment NOTIFIED PHYSICIAN   I-stat chem 8, ed     Status: Abnormal   Collection Time: 06/02/23  9:56 AM  Result Value Ref Range   Sodium 138 135 - 145 mmol/L   Potassium 4.0 3.5 - 5.1 mmol/L   Chloride 102 98 - 111 mmol/L   BUN 19 8 - 23 mg/dL   Creatinine, Ser 1.61 (H) 0.61 - 1.24 mg/dL   Glucose, Bld 096 (H) 70 - 99 mg/dL   Calcium, Ion 0.45 (L) 1.15 - 1.40 mmol/L   TCO2 27 22 - 32 mmol/L   Hemoglobin 8.2 (L) 13.0 - 17.0 g/dL   HCT 40.9 (L) 81.1 - 91.4 %  Type and screen  [facility]     Status: None   Collection Time: 06/02/23  3:14 PM  Result Value Ref Range   ABO/RH(D) O POS    Antibody Screen NEG    Sample Expiration      06/05/2023,2359 Performed at [facility], [address], [place], [place] [address]   Troponin I (High Sensitivity)     Status: Abnormal   Collection Time: 06/02/23  3:18 PM  Result Value Ref Range   Troponin I (High Sensitivity) 24 (H) <18 ng/L  CBC      Status: Abnormal   Collection Time: 06/02/23  3:23 PM  Result Value Ref Range   WBC 9.8 4.0 - 10.5 K/uL   RBC 3.22 (L) 4.22 - 5.81 MIL/uL   Hemoglobin 8.9 (L) 13.0 - 17.0 g/dL   HCT 62.1 (L) 30.8 - 65.7 %   MCV 92.2 80.0 - 100.0 fL   MCH 27.6 26.0 - 34.0 pg   MCHC 30.0 30.0 - 36.0 g/dL   RDW 84.6 (H) 96.2 - 95.2 %   Platelets 111 (L) 150 - 400 K/uL   nRBC 0.0 0.0 - 0.2 %  Lactic acid, plasma     Status: None   Collection Time: 06/02/23  3:23 PM  Result Value Ref Range   Lactic Acid, Venous 1.6 0.5 - 1.9 mmol/L   Recent Results (from the past 240 hours)  Resp panel by RT-PCR (RSV, Flu A&B, Covid) Anterior Nasal Swab     Status: None   Collection Time: 06/02/23  5:58 AM   Specimen: Anterior Nasal Swab  Result Value Ref Range Status   SARS Coronavirus 2 by RT PCR NEGATIVE NEGATIVE Final   Influenza A by PCR NEGATIVE NEGATIVE Final   Influenza B by PCR NEGATIVE NEGATIVE Final    Comment: (NOTE) The Xpert Xpress SARS-CoV-2/FLU/RSV plus assay is intended as an aid in the diagnosis of influenza from Nasopharyngeal swab specimens and should not be used as a sole basis for treatment. Nasal washings and aspirates are unacceptable for Xpert Xpress SARS-CoV-2/FLU/RSV testing.  Fact Sheet for Patients: BloggerCourse.com  Fact Sheet for Healthcare Providers: SeriousBroker.it  This test is not yet approved or cleared by the [place] FDA and has been authorized for detection and/or diagnosis of SARS-CoV-2 by FDA under an Emergency Use Authorization (EUA). This EUA will remain in effect (meaning this test can be used) for the duration of the COVID-19 declaration under Section 564(b)(1) of the Act, 21 U.S.C. section 360bbb-3(b)(1), unless the authorization is terminated or revoked.     Resp Syncytial Virus by PCR NEGATIVE NEGATIVE Final    Comment: (NOTE) Fact Sheet for  Patients: BloggerCourse.com  Fact Sheet  for Healthcare Providers: SeriousBroker.it  This test is not yet approved or cleared by the [place] and has been authorized for detection and/or diagnosis of SARS-CoV-2 by FDA under an Emergency Use Authorization (EUA). This EUA will remain in effect (meaning this test can be used) for the duration of the COVID-19 declaration under Section 564(b)(1) of the Act, 21 U.S.C. section 360bbb-3(b)(1), unless the authorization is terminated or revoked.  Performed at [facility], [address], [place], [place] [address]     Renal Function: Recent Labs    06/02/23 0522 06/02/23 0524 06/02/23 0956  CREATININE 1.30* 1.25* 1.30*   Estimated Creatinine Clearance: 64.7 mL/min (A) (by C-G formula based on SCr of 1.3 mg/dL (H)).  Radiologic Imaging: CT ABDOMEN PELVIS W WO CONTRAST Result Date: 06/02/2023 CLINICAL DATA:  72 year old male with history of dementia. Suspected urolithiasis. EXAM: CT ABDOMEN AND PELVIS WITHOUT AND WITH CONTRAST TECHNIQUE: Multidetector CT imaging of the abdomen and pelvis was performed following the standard protocol before and following the bolus administration of intravenous contrast. RADIATION DOSE REDUCTION: This exam was performed according to the departmental dose-optimization program which includes automated exposure control, adjustment of the mA and/or kV according to patient size and/or use of iterative reconstruction technique. CONTRAST:  75mL OMNIPAQUE IOHEXOL 350 MG/ML SOLN COMPARISON:  CT of the abdomen and pelvis 05/16/2023. FINDINGS: Lower chest: Calcified granuloma in the right middle lobe. Atherosclerotic calcifications in the left main, left anterior descending and right coronary arteries. Hepatobiliary: No definite suspicious cystic or solid hepatic lesions are confidently identified on today's noncontrast CT examination. Unenhanced appearance of  the gallbladder is unremarkable. Pancreas: No definite pancreatic mass or peripancreatic fluid collections or inflammatory changes are noted on today's noncontrast CT examination. Spleen: Unremarkable. Adrenals/Urinary Tract: There is a large amount of gas present in the nondependent aspect of the urinary bladder. There also appears to be some debris dependently in the urinary bladder, which also contains a small amount of gas (best appreciated on axial image 91 of series 3). Superior aspect of the urinary bladder appears thickened and intimately associated with the adjacent sigmoid colon, centered around what appears to be a large diverticulum, best appreciated on coronal image 56 of series 10. Small amount of gas in the superior wall of the urinary bladder (coronal image 57 of series 10). Moderate right and mild left hydroureteronephrosis. Small amount of gas within the collecting systems of both kidneys (right greater than left). Unenhanced appearance of the kidneys and bilateral adrenal glands is otherwise unremarkable. Stomach/Bowel: Unenhanced appearance of the stomach is normal. No pathologic dilatation of small bowel or colon. 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 Normal appendix. Vascular/Lymphatic: Atherosclerosis in the abdominal aorta and pelvic vasculature. No lymphadenopathy noted in the abdomen or pelvis. Reproductive: Prostate gland and seminal vesicles are unremarkable in appearance. Other: No significant volume of ascites.  No pneumoperitoneum. Musculoskeletal: There are no aggressive appearing lytic or blastic lesions noted in the visualized portions of the skeleton. IMPRESSION: 1. 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 Urologic consultation is recommended for further clinical evaluation. 2. Aortic atherosclerosis. Electronically Signed   By: Trudie Reed M.D.   On: 06/02/2023 10:19   CT Head Wo Contrast Result Date: 06/02/2023 CLINICAL DATA:  The patient fell out of bed today sustaining head and neck trauma. EXAM: CT HEAD WITHOUT CONTRAST CT CERVICAL SPINE WITHOUT CONTRAST TECHNIQUE: Multidetector CT imaging of the head and cervical spine was performed following the standard protocol without intravenous contrast. Multiplanar CT image reconstructions of the cervical spine were also generated. RADIATION DOSE REDUCTION: This exam was performed according to the departmental dose-optimization program which includes automated exposure control, adjustment of the mA and/or kV according to patient size and/or use of iterative reconstruction technique. COMPARISON:  Head CT and cervical spine CT recently both on 05/13/2023. FINDINGS: CT HEAD FINDINGS Brain: There is mild global atrophy, mild atrophic ventriculomegaly and small-vessel disease of the cerebral white matter, chronic senescent mineralization in the basal ganglia. No new abnormality concerning for a cortical based acute infarct, hemorrhage, mass or mass effect is seen and no midline shift. Basal cisterns are clear. There have been no appreciable interval changes. Vascular: No hyperdense vessel or unexpected calcification. Skull: Negative for fractures or focal lesions. Sinuses/Orbits: Patchy membrane thickening again noted in the ethmoid air cells with mild membrane disease in the maxillary sinuses. The frontal and sphenoid sinus, bilateral mastoid air cells, and middle ears are clear. The nasal septum is S shaped. Old lens extractions with otherwise negative orbits. Other: None. CT CERVICAL SPINE FINDINGS Alignment: Reversed cervical lordosis centered at C4-5, stable, with unchanged 3 mm grade 1 C4-5 anterolisthesis. No other listhesis is  seen. Bone-on-bone anterior atlantodental joint space loss is also again noted, with osteophytes. Skull base and vertebrae: There is mild osteopenia without evidence of fractures, primary bone lesions or focal pathologic process. There is chronic ankylosis across the C4-5 and C5-6 facet joints. There are anterior bridging osteophytes C4-7. Soft tissues and spinal canal: No prevertebral fluid or swelling. No visible canal hematoma. There are minimal calcifications at the carotid bifurcations. No thyroid or laryngeal mass. Disc levels: There is chronic disc collapse C5-6, C6-7, C7-T1, normal disc heights above C5. There are dorsal disc osteophyte complexes C4-5 through C7-T1, associated with spinal canal stenosis and mild spondylotic cord compression from C4-5 through C6-7. The upper levels do not show significant soft tissue or bony encroachment on the spinal canal. There is multilevel facet joint and uncinate hypertrophy, with foraminal stenosis which is severe on the left at C2-3, bilaterally severe C3-4 through C5-6, bilaterally moderate to severe C6-7. Upper chest: Negative. Other: None. IMPRESSION: 1. No acute intracranial CT findings or depressed skull fractures. 2. Atrophy and small-vessel disease. 3. 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 4. Osteopenia and degenerative change without evidence of cervical fractures. 5. Reversed cervical lordosis with grade 1 C4-5 degenerative anterolisthesis. 6. Multilevel spinal canal and foraminal stenosis. 7. Carotid atherosclerosis. Electronically Signed   By: Almira Bar M.D.   On: 06/02/2023 07:03   CT Cervical Spine Wo Contrast Result Date: 06/02/2023 CLINICAL DATA:  The patient fell out of bed today sustaining head and neck trauma. EXAM: CT HEAD WITHOUT CONTRAST CT CERVICAL SPINE WITHOUT CONTRAST TECHNIQUE: Multidetector CT imaging of the head and cervical spine was performed following the standard protocol without intravenous contrast. Multiplanar CT  image reconstructions of the cervical spine were also generated. RADIATION DOSE REDUCTION: This exam was performed according to the departmental dose-optimization program which includes automated exposure control, adjustment of the mA and/or kV according to patient size and/or use of iterative reconstruction technique. COMPARISON:  Head CT and cervical spine CT recently both on 05/13/2023. FINDINGS: CT HEAD FINDINGS Brain: There is mild global atrophy, mild atrophic  ventriculomegaly and small-vessel disease of the cerebral white matter, chronic senescent mineralization in the basal ganglia. No new abnormality concerning for a cortical based acute infarct, hemorrhage, mass or mass effect is seen and no midline shift. Basal cisterns are clear. There have been no appreciable interval changes. Vascular: No hyperdense vessel or unexpected calcification. Skull: Negative for fractures or focal lesions. Sinuses/Orbits: Patchy membrane thickening again noted in the ethmoid air cells with mild membrane disease in the maxillary sinuses. The frontal and sphenoid sinus, bilateral mastoid air cells, and middle ears are clear. The nasal septum is S shaped. Old lens extractions with otherwise negative orbits. Other: None. CT CERVICAL SPINE FINDINGS Alignment: Reversed cervical lordosis centered at C4-5, stable, with unchanged 3 mm grade 1 C4-5 anterolisthesis. No other listhesis is seen. Bone-on-bone anterior atlantodental joint space loss is also again noted, with osteophytes. Skull base and vertebrae: There is mild osteopenia without evidence of fractures, primary bone lesions or focal pathologic process. There is chronic ankylosis across the C4-5 and C5-6 facet joints. There are anterior bridging osteophytes C4-7. Soft tissues and spinal canal: No prevertebral fluid or swelling. No visible canal hematoma. There are minimal calcifications at the carotid bifurcations. No thyroid or laryngeal mass. Disc levels: There is chronic  disc collapse C5-6, C6-7, C7-T1, normal disc heights above C5. There are dorsal disc osteophyte complexes C4-5 through C7-T1, associated with spinal canal stenosis and mild spondylotic cord compression from C4-5 through C6-7. The upper levels do not show significant soft tissue or bony encroachment on the spinal canal. There is multilevel facet joint and uncinate hypertrophy, with foraminal stenosis which is severe on the left at C2-3, bilaterally severe C3-4 through C5-6, bilaterally moderate to severe C6-7. Upper chest: Negative. Other: None. IMPRESSION: 1. No acute intracranial CT findings or depressed skull fractures. 2. Atrophy and small-vessel disease. 3. 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 4. Osteopenia and degenerative change without evidence of cervical fractures. 5. Reversed cervical lordosis with grade 1 C4-5 degenerative anterolisthesis. 6. Multilevel spinal canal and foraminal stenosis. 7. Carotid atherosclerosis. Electronically Signed   By: Almira Bar M.D.   On: 06/02/2023 07:03   DG Chest Port 1 View Result Date: 06/02/2023 CLINICAL DATA:  72 year old male with possible sepsis. Fall, altered mental status. EXAM: PORTABLE CHEST 1 VIEW COMPARISON:  Portable chest 05/12/2023 and earlier. FINDINGS: Portable AP semi upright view at 0546 hours. Stable low lung volumes. More rotated to the left now. Stable cardiac size and mediastinal contours. Stable lung volumes and ventilation. Calcified right midlung granuloma (no follow-up imaging recommended). No pneumothorax, pleural effusion, acute pulmonary opacity. Paucity of bowel gas the visible abdomen. No acute osseous abnormality identified. IMPRESSION: Chronically Low lung volumes. No acute cardiopulmonary abnormality or acute traumatic injury identified. Electronically Signed   By: Odessa Fleming M.D.   On: 06/02/2023 05:59    I independently reviewed the above imaging studies.  Impression/Recommendation UTI  Sepsis -likely secondary to  ascending UTI Possible colovesical fistula  Recommend decompression of the bladder with a Foley catheter. Await urine culture results, Agree with broad-spectrum IV antibiotics. General surgery consultation for possible surgical management of colovesical fistula. He recently had cystoscopy in the office which did not show any obvious bladder abnormality by report.  Will discuss further with Dr. Alvester Morin regarding need for additional urologic evaluation. Recommend colonoscopy.  Di Kindle 06/02/2023, 5:09 PM

## 2023-06-02 NOTE — Assessment & Plan Note (Addendum)
Intolerant to bipap

## 2023-06-02 NOTE — Assessment & Plan Note (Signed)
Continue mysoline

## 2023-06-02 NOTE — Assessment & Plan Note (Signed)
>>  ASSESSMENT AND PLAN FOR NORMOCYTIC ANEMIA WRITTEN ON 06/02/2023 12:46 PM BY WADDELL RAKE, MD  Recent baseline of 8.5 since December, stable, but downtrending since 05/2023 Type and screen B12/folate and iron studies done 05/2023 Monitor closely with frank hematuria

## 2023-06-02 NOTE — ED Notes (Signed)
Putting pt back on 4L [place]

## 2023-06-02 NOTE — ED Notes (Signed)
Put condom cath on

## 2023-06-02 NOTE — ED Notes (Signed)
Pt dropped down to 88 RA without oxygen, applied nc2L

## 2023-06-03 DIAGNOSIS — I9589 Other hypotension: Secondary | ICD-10-CM | POA: Diagnosis not present

## 2023-06-03 DIAGNOSIS — E785 Hyperlipidemia, unspecified: Secondary | ICD-10-CM | POA: Diagnosis present

## 2023-06-03 DIAGNOSIS — W06XXXA Fall from bed, initial encounter: Secondary | ICD-10-CM | POA: Diagnosis present

## 2023-06-03 DIAGNOSIS — R0602 Shortness of breath: Secondary | ICD-10-CM | POA: Diagnosis not present

## 2023-06-03 DIAGNOSIS — G9341 Metabolic encephalopathy: Secondary | ICD-10-CM | POA: Diagnosis present

## 2023-06-03 DIAGNOSIS — F3130 Bipolar disorder, current episode depressed, mild or moderate severity, unspecified: Secondary | ICD-10-CM | POA: Diagnosis present

## 2023-06-03 DIAGNOSIS — Z1152 Encounter for screening for COVID-19: Secondary | ICD-10-CM | POA: Diagnosis not present

## 2023-06-03 DIAGNOSIS — N138 Other obstructive and reflux uropathy: Secondary | ICD-10-CM | POA: Diagnosis present

## 2023-06-03 DIAGNOSIS — F1021 Alcohol dependence, in remission: Secondary | ICD-10-CM | POA: Diagnosis present

## 2023-06-03 DIAGNOSIS — Y92003 Bedroom of unspecified non-institutional (private) residence as the place of occurrence of the external cause: Secondary | ICD-10-CM | POA: Diagnosis not present

## 2023-06-03 DIAGNOSIS — E872 Acidosis, unspecified: Secondary | ICD-10-CM | POA: Diagnosis present

## 2023-06-03 DIAGNOSIS — F03A3 Unspecified dementia, mild, with mood disturbance: Secondary | ICD-10-CM | POA: Diagnosis present

## 2023-06-03 DIAGNOSIS — B965 Pseudomonas (aeruginosa) (mallei) (pseudomallei) as the cause of diseases classified elsewhere: Secondary | ICD-10-CM | POA: Diagnosis not present

## 2023-06-03 DIAGNOSIS — J9601 Acute respiratory failure with hypoxia: Secondary | ICD-10-CM | POA: Diagnosis present

## 2023-06-03 DIAGNOSIS — A4152 Sepsis due to Pseudomonas: Secondary | ICD-10-CM | POA: Diagnosis present

## 2023-06-03 DIAGNOSIS — D649 Anemia, unspecified: Secondary | ICD-10-CM | POA: Diagnosis present

## 2023-06-03 DIAGNOSIS — N321 Vesicointestinal fistula: Secondary | ICD-10-CM | POA: Diagnosis present

## 2023-06-03 DIAGNOSIS — N1330 Unspecified hydronephrosis: Secondary | ICD-10-CM | POA: Diagnosis present

## 2023-06-03 DIAGNOSIS — N390 Urinary tract infection, site not specified: Secondary | ICD-10-CM | POA: Diagnosis present

## 2023-06-03 DIAGNOSIS — I420 Dilated cardiomyopathy: Secondary | ICD-10-CM | POA: Diagnosis present

## 2023-06-03 DIAGNOSIS — I4729 Other ventricular tachycardia: Secondary | ICD-10-CM | POA: Diagnosis not present

## 2023-06-03 DIAGNOSIS — Y846 Urinary catheterization as the cause of abnormal reaction of the patient, or of later complication, without mention of misadventure at the time of the procedure: Secondary | ICD-10-CM | POA: Diagnosis not present

## 2023-06-03 DIAGNOSIS — E66811 Obesity, class 1: Secondary | ICD-10-CM | POA: Diagnosis present

## 2023-06-03 DIAGNOSIS — I110 Hypertensive heart disease with heart failure: Secondary | ICD-10-CM | POA: Diagnosis present

## 2023-06-03 DIAGNOSIS — N179 Acute kidney failure, unspecified: Secondary | ICD-10-CM | POA: Diagnosis present

## 2023-06-03 DIAGNOSIS — I5022 Chronic systolic (congestive) heart failure: Secondary | ICD-10-CM | POA: Diagnosis present

## 2023-06-03 DIAGNOSIS — A419 Sepsis, unspecified organism: Secondary | ICD-10-CM | POA: Diagnosis present

## 2023-06-03 DIAGNOSIS — T83091A Other mechanical complication of indwelling urethral catheter, initial encounter: Secondary | ICD-10-CM | POA: Diagnosis not present

## 2023-06-03 DIAGNOSIS — D6959 Other secondary thrombocytopenia: Secondary | ICD-10-CM | POA: Diagnosis present

## 2023-06-03 DIAGNOSIS — R1310 Dysphagia, unspecified: Secondary | ICD-10-CM | POA: Diagnosis present

## 2023-06-03 DIAGNOSIS — R6520 Severe sepsis without septic shock: Secondary | ICD-10-CM

## 2023-06-03 DIAGNOSIS — R0989 Other specified symptoms and signs involving the circulatory and respiratory systems: Secondary | ICD-10-CM | POA: Diagnosis not present

## 2023-06-03 DIAGNOSIS — I471 Supraventricular tachycardia, unspecified: Secondary | ICD-10-CM | POA: Diagnosis not present

## 2023-06-03 DIAGNOSIS — F0394 Unspecified dementia, unspecified severity, with anxiety: Secondary | ICD-10-CM | POA: Diagnosis not present

## 2023-06-03 DIAGNOSIS — R6521 Severe sepsis with septic shock: Secondary | ICD-10-CM | POA: Diagnosis present

## 2023-06-03 DIAGNOSIS — F3131 Bipolar disorder, current episode depressed, mild: Secondary | ICD-10-CM | POA: Diagnosis not present

## 2023-06-03 DIAGNOSIS — K5732 Diverticulitis of large intestine without perforation or abscess without bleeding: Secondary | ICD-10-CM | POA: Diagnosis present

## 2023-06-03 HISTORY — DX: Sepsis, unspecified organism: R65.20

## 2023-06-03 LAB — COMPREHENSIVE METABOLIC PANEL
ALT: 14 U/L (ref 0–44)
AST: 33 U/L (ref 15–41)
Albumin: 2.6 g/dL — ABNORMAL LOW (ref 3.5–5.0)
Alkaline Phosphatase: 42 U/L (ref 38–126)
Anion gap: 6 (ref 5–15)
BUN: 16 mg/dL (ref 8–23)
CO2: 24 mmol/L (ref 22–32)
Calcium: 7.7 mg/dL — ABNORMAL LOW (ref 8.9–10.3)
Chloride: 103 mmol/L (ref 98–111)
Creatinine, Ser: 1.07 mg/dL (ref 0.61–1.24)
GFR, Estimated: >60 mL/min (ref >60)
Glucose, Bld: 105 mg/dL — ABNORMAL HIGH (ref 70–99)
Potassium: 4 mmol/L (ref 3.5–5.1)
Sodium: 133 mmol/L — ABNORMAL LOW (ref 135–145)
Total Bilirubin: 0.4 mg/dL (ref <1.2)
Total Protein: 6.3 g/dL — ABNORMAL LOW (ref 6.5–8.1)

## 2023-06-03 LAB — CBC
HCT: 30 % — ABNORMAL LOW (ref 39.0–52.0)
Hemoglobin: 8.7 g/dL — ABNORMAL LOW (ref 13.0–17.0)
MCH: 27.5 pg (ref 26.0–34.0)
MCHC: 29 g/dL — ABNORMAL LOW (ref 30.0–36.0)
MCV: 94.9 fL (ref 80.0–100.0)
Platelets: 91 10*3/uL — ABNORMAL LOW (ref 150–400)
RBC: 3.16 MIL/uL — ABNORMAL LOW (ref 4.22–5.81)
RDW: 17.7 % — ABNORMAL HIGH (ref 11.5–15.5)
WBC: 8.5 10*3/uL (ref 4.0–10.5)
nRBC: 0 % (ref 0.0–0.2)

## 2023-06-03 LAB — ABO/RH: ABO/RH(D): O POS

## 2023-06-03 LAB — PROCALCITONIN: Procalcitonin: 0.32 ng/mL

## 2023-06-03 MED ORDER — SALINE SPRAY 0.65 % NA SOLN
1.0000 | NASAL | Status: DC | PRN
  Filled 2023-06-03: qty 44

## 2023-06-03 MED ORDER — FLUTICASONE PROPIONATE 50 MCG/ACT NA SUSP
1.0000 | Freq: Every day | NASAL | Status: DC
Start: 2023-06-03 — End: 2023-06-11
  Administered 2023-06-03 – 2023-06-11 (×9): 1 via NASAL
  Filled 2023-06-03 (×2): qty 16

## 2023-06-03 MED ORDER — LACTATED RINGERS IV SOLN: INTRAVENOUS | Status: DC

## 2023-06-03 MED ORDER — CHLORHEXIDINE GLUCONATE CLOTH 2 % EX PADS
6.0000 | MEDICATED_PAD | Freq: Every day | CUTANEOUS | Status: DC
  Administered 2023-06-04 – 2023-06-11 (×7): 6 via TOPICAL

## 2023-06-03 NOTE — Progress Notes (Signed)
Subjective/Chief Complaint: Mildly confused Reports no abdominal pain Foley in place and draining Normal WBC Afebrile   Objective: Vital signs in last 24 hours: Temp:  [99 F (37.2 C)-102.8 F (39.3 C)] 100 F (37.8 C) (12/30 0403) Pulse Rate:  [65-99] 84 (12/30 0403) Resp:  [16-33] 16 (12/29 1842) BP: (75-159)/(43-147) 143/75 (12/30 0403) SpO2:  [89 %-100 %] 100 % (12/30 0403) Last BM Date : 06/02/23  Intake/Output from previous day: 12/29 0701 - 12/30 0700 In: 2501.8 [P.O.:400; I.V.:801.7; IV Piggyback:1300.1] Out: 700 [Urine:700] Intake/Output this shift: No intake/output data recorded.  Elderly male - NAD Mildly confused Abd - soft, non-tender   Lab Results:  Recent Labs    06/02/23 1523 06/03/23 0432  WBC 9.8 8.5  HGB 8.9* 8.7*  HCT 29.7* 30.0*  PLT 111* 91*   BMET Recent Labs    06/02/23 0524 06/02/23 0956 06/03/23 0432  NA 137 138 133*  K 4.0 4.0 4.0  CL 103 102 103  CO2 24  --  24  GLUCOSE 153* 124* 105*  BUN 15 19 16   CREATININE 1.25* 1.30* 1.07  CALCIUM 8.3*  --  7.7*   PT/INR Recent Labs    06/02/23 0524  LABPROT 15.0  INR 1.2   ABG No results for input(s): "PHART", "HCO3" in the last 72 hours.  Invalid input(s): "PCO2", "PO2"  Studies/Results: CT ABDOMEN PELVIS W WO CONTRAST Result Date: 06/02/2023 CLINICAL DATA:  72 year old male with history of dementia. Suspected urolithiasis. EXAM: CT ABDOMEN AND PELVIS WITHOUT AND WITH CONTRAST TECHNIQUE: Multidetector CT imaging of the abdomen and pelvis was performed following the standard protocol before and following the bolus administration of intravenous contrast. RADIATION DOSE REDUCTION: This exam was performed according to the departmental dose-optimization program which includes automated exposure control, adjustment of the mA and/or kV according to patient size and/or use of iterative reconstruction technique. CONTRAST:  75mL OMNIPAQUE IOHEXOL 350 MG/ML SOLN COMPARISON:  CT of  the abdomen and pelvis 05/16/2023. FINDINGS: Lower chest: Calcified granuloma in the right middle lobe. Atherosclerotic calcifications in the left main, left anterior descending and right coronary arteries. Hepatobiliary: No definite suspicious cystic or solid hepatic lesions are confidently identified on today's noncontrast CT examination. Unenhanced appearance of the gallbladder is unremarkable. Pancreas: No definite pancreatic mass or peripancreatic fluid collections or inflammatory changes are noted on today's noncontrast CT examination. Spleen: Unremarkable. Adrenals/Urinary Tract: There is a large amount of gas present in the nondependent aspect of the urinary bladder. There also appears to be some debris dependently in the urinary bladder, which also contains a small amount of gas (best appreciated on axial image 91 of series 3). Superior aspect of the urinary bladder appears thickened and intimately associated with the adjacent sigmoid colon, centered around what appears to be a large diverticulum, best appreciated on coronal image 56 of series 10. Small amount of gas in the superior wall of the urinary bladder (coronal image 57 of series 10). Moderate right and mild left hydroureteronephrosis. Small amount of gas within the collecting systems of both kidneys (right greater than left). Unenhanced appearance of the kidneys and bilateral adrenal glands is otherwise unremarkable. Stomach/Bowel: Unenhanced appearance of the stomach is normal. No pathologic dilatation of small bowel or colon. 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 Normal appendix. Vascular/Lymphatic: Atherosclerosis in the abdominal aorta and pelvic vasculature. No lymphadenopathy noted in the abdomen or pelvis. Reproductive: Prostate gland and seminal vesicles are unremarkable in appearance. Other: No  significant volume of ascites.  No pneumoperitoneum. Musculoskeletal:  There are no aggressive appearing lytic or blastic lesions noted in the visualized portions of the skeleton. IMPRESSION: 1. 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 Urologic consultation is recommended for further clinical evaluation. 2. Aortic atherosclerosis. Electronically Signed   By: Trudie Reed M.D.   On: 06/02/2023 10:19   CT Head Wo Contrast Result Date: 06/02/2023 CLINICAL DATA:  The patient fell out of bed today sustaining head and neck trauma. EXAM: CT HEAD WITHOUT CONTRAST CT CERVICAL SPINE WITHOUT CONTRAST TECHNIQUE: Multidetector CT imaging of the head and cervical spine was performed following the standard protocol without intravenous contrast. Multiplanar CT image reconstructions of the cervical spine were also generated. RADIATION DOSE REDUCTION: This exam was performed according to the departmental dose-optimization program which includes automated exposure control, adjustment of the mA and/or kV according to patient size and/or use of iterative reconstruction technique. COMPARISON:  Head CT and cervical spine CT recently both on 05/13/2023. FINDINGS: CT HEAD FINDINGS Brain: There is mild global atrophy, mild atrophic ventriculomegaly and small-vessel disease of the cerebral white matter, chronic senescent mineralization in the basal ganglia. No new abnormality concerning for a cortical based acute infarct, hemorrhage, mass or mass effect is seen and no midline shift. Basal cisterns are clear. There have been no appreciable interval changes. Vascular: No hyperdense vessel or unexpected calcification. Skull: Negative for fractures or focal lesions. Sinuses/Orbits: Patchy membrane thickening again noted in the ethmoid air cells with mild membrane disease in the  maxillary sinuses. The frontal and sphenoid sinus, bilateral mastoid air cells, and middle ears are clear. The nasal septum is S shaped. Old lens extractions with otherwise negative orbits. Other: None. CT CERVICAL SPINE FINDINGS Alignment: Reversed cervical lordosis centered at C4-5, stable, with unchanged 3 mm grade 1 C4-5 anterolisthesis. No other listhesis is seen. Bone-on-bone anterior atlantodental joint space loss is also again noted, with osteophytes. Skull base and vertebrae: There is mild osteopenia without evidence of fractures, primary bone lesions or focal pathologic process. There is chronic ankylosis across the C4-5 and C5-6 facet joints. There are anterior bridging osteophytes C4-7. Soft tissues and spinal canal: No prevertebral fluid or swelling. No visible canal hematoma. There are minimal calcifications at the carotid bifurcations. No thyroid or laryngeal mass. Disc levels: There is chronic disc collapse C5-6, C6-7, C7-T1, normal disc heights above C5. There are dorsal disc osteophyte complexes C4-5 through C7-T1, associated with spinal canal stenosis and mild spondylotic cord compression from C4-5 through C6-7. The upper levels do not show significant soft tissue or bony encroachment on the spinal canal. There is multilevel facet joint and uncinate hypertrophy, with foraminal stenosis which is severe on the left at C2-3, bilaterally severe C3-4 through C5-6, bilaterally moderate to severe C6-7. Upper chest: Negative. Other: None. IMPRESSION: 1. No acute intracranial CT findings or depressed skull fractures. 2. Atrophy and small-vessel disease. 3. 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 4. Osteopenia and degenerative change without evidence of cervical fractures. 5. Reversed cervical lordosis with grade 1 C4-5 degenerative anterolisthesis. 6. Multilevel spinal canal and foraminal stenosis. 7. Carotid atherosclerosis. Electronically Signed   By: Almira Bar M.D.   On: 06/02/2023 07:03    CT Cervical Spine Wo Contrast Result Date: 06/02/2023 CLINICAL DATA:  The patient fell  out of bed today sustaining head and neck trauma. EXAM: CT HEAD WITHOUT CONTRAST CT CERVICAL SPINE WITHOUT CONTRAST TECHNIQUE: Multidetector CT imaging of the head and cervical spine was performed following the standard protocol without intravenous contrast. Multiplanar CT image reconstructions of the cervical spine were also generated. RADIATION DOSE REDUCTION: This exam was performed according to the departmental dose-optimization program which includes automated exposure control, adjustment of the mA and/or kV according to patient size and/or use of iterative reconstruction technique. COMPARISON:  Head CT and cervical spine CT recently both on 05/13/2023. FINDINGS: CT HEAD FINDINGS Brain: There is mild global atrophy, mild atrophic ventriculomegaly and small-vessel disease of the cerebral white matter, chronic senescent mineralization in the basal ganglia. No new abnormality concerning for a cortical based acute infarct, hemorrhage, mass or mass effect is seen and no midline shift. Basal cisterns are clear. There have been no appreciable interval changes. Vascular: No hyperdense vessel or unexpected calcification. Skull: Negative for fractures or focal lesions. Sinuses/Orbits: Patchy membrane thickening again noted in the ethmoid air cells with mild membrane disease in the maxillary sinuses. The frontal and sphenoid sinus, bilateral mastoid air cells, and middle ears are clear. The nasal septum is S shaped. Old lens extractions with otherwise negative orbits. Other: None. CT CERVICAL SPINE FINDINGS Alignment: Reversed cervical lordosis centered at C4-5, stable, with unchanged 3 mm grade 1 C4-5 anterolisthesis. No other listhesis is seen. Bone-on-bone anterior atlantodental joint space loss is also again noted, with osteophytes. Skull base and vertebrae: There is mild osteopenia without evidence of fractures, primary bone  lesions or focal pathologic process. There is chronic ankylosis across the C4-5 and C5-6 facet joints. There are anterior bridging osteophytes C4-7. Soft tissues and spinal canal: No prevertebral fluid or swelling. No visible canal hematoma. There are minimal calcifications at the carotid bifurcations. No thyroid or laryngeal mass. Disc levels: There is chronic disc collapse C5-6, C6-7, C7-T1, normal disc heights above C5. There are dorsal disc osteophyte complexes C4-5 through C7-T1, associated with spinal canal stenosis and mild spondylotic cord compression from C4-5 through C6-7. The upper levels do not show significant soft tissue or bony encroachment on the spinal canal. There is multilevel facet joint and uncinate hypertrophy, with foraminal stenosis which is severe on the left at C2-3, bilaterally severe C3-4 through C5-6, bilaterally moderate to severe C6-7. Upper chest: Negative. Other: None. IMPRESSION: 1. No acute intracranial CT findings or depressed skull fractures. 2. Atrophy and small-vessel disease. 3. 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 4. Osteopenia and degenerative change without evidence of cervical fractures. 5. Reversed cervical lordosis with grade 1 C4-5 degenerative anterolisthesis. 6. Multilevel spinal canal and foraminal stenosis. 7. Carotid atherosclerosis. Electronically Signed   By: Almira Bar M.D.   On: 06/02/2023 07:03   DG Chest Port 1 View Result Date: 06/02/2023 CLINICAL DATA:  72 year old male with possible sepsis. Fall, altered mental status. EXAM: PORTABLE CHEST 1 VIEW COMPARISON:  Portable chest 05/12/2023 and earlier. FINDINGS: Portable AP semi upright view at 0546 hours. Stable low lung volumes. More rotated to the left now. Stable cardiac size and mediastinal contours. Stable lung volumes and ventilation. Calcified right midlung granuloma (no follow-up imaging recommended). No pneumothorax, pleural effusion, acute pulmonary opacity. Paucity of bowel gas the  visible abdomen. No acute osseous abnormality identified. IMPRESSION: Chronically Low lung volumes. No acute cardiopulmonary abnormality or acute traumatic injury identified. Electronically Signed   By: Odessa Fleming M.D.   On: 06/02/2023 05:59    Anti-infectives: Anti-infectives (From admission,  onward)    Start     Dose/Rate Route Frequency Ordered Stop   06/03/23 0945  cefTRIAXone (ROCEPHIN) 2 g in sodium chloride 0.9 % 100 mL IVPB  Status:  Discontinued        2 g 200 mL/hr over 30 Minutes Intravenous Every 24 hours 06/02/23 1022 06/02/23 1405   06/02/23 1600  piperacillin-tazobactam (ZOSYN) IVPB 3.375 g        3.375 g 12.5 mL/hr over 240 Minutes Intravenous Every 8 hours 06/02/23 1405 06/07/23 1559   06/02/23 1130  metroNIDAZOLE (FLAGYL) IVPB 500 mg  Status:  Discontinued        500 mg 100 mL/hr over 60 Minutes Intravenous Every 12 hours 06/02/23 1128 06/02/23 1405   06/02/23 0945  cefTRIAXone (ROCEPHIN) 1 g in sodium chloride 0.9 % 100 mL IVPB  Status:  Discontinued        1 g 200 mL/hr over 30 Minutes Intravenous Every 24 hours 06/02/23 0931 06/02/23 1022   06/02/23 0545  aztreonam (AZACTAM) 1 g in sodium chloride 0.9 % 100 mL IVPB  Status:  Discontinued        1 g 200 mL/hr over 30 Minutes Intravenous  Once 06/02/23 0534 06/02/23 0534   06/02/23 0545  aztreonam (AZACTAM) 2 g in sodium chloride 0.9 % 100 mL IVPB        2 g 200 mL/hr over 30 Minutes Intravenous  Once 06/02/23 0535 06/02/23 9147       Assessment/Plan:  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  Will hopefully be able to transition to outpatient work-up with colonoscopy, cardiac clearance.  Eventual elective colon resection with takedown of colovesical fistula.  Currently, no indications for urgent surgical intervention.  FTE - Advance to  full liquids  Principal Problem:   Septic shock from UTI Active Problems:   Bipolar affective disorder, depressed (HCC)   Dementia (HCC)   Hyperlipidemia   OSA treated with BiPAP   HFrEF (heart failure with reduced ejection fraction) (HCC)   Benign prostatic hyperplasia with urinary obstruction   Stage 3 chronic kidney disease (HCC)   Normocytic anemia   Tremor   Acute respiratory failure with hypoxia (HCC)   Thrombocytopenia (HCC)   LBBB (left bundle branch block)    LOS: 0 days    Calvin Escobar 06/03/2023

## 2023-06-03 NOTE — Plan of Care (Signed)
  Problem: Activity: Goal: Risk for activity intolerance will decrease Outcome: Progressing   Problem: Pain Management: Goal: General experience of comfort will improve Outcome: Progressing   Problem: Safety: Goal: Ability to remain free from injury will improve Outcome: Progressing

## 2023-06-03 NOTE — Progress Notes (Signed)
[facility] Progress Note Patient: Calvin Escobar VZD:[id number] DOB: 1950/09/10 DOA: 06/02/2023  DOS: the patient was seen and examined on 06/03/2023  Brief Hospital Course: PMH of HTN, HLD, BPH, HFrEF, bipolar disorder, OSA on CPAP, tremor presented to hospital with complaints of confusion and fall. 12/8 - 12/12 hospitalized for confusion and was treated for severe sepsis secondary to E. coli bacteremia from UTI-emphysematous cystitis. Has not been eating or drinking prior to admission and had worsening tremors and then had a fall and unable to get up all by himself and therefore was brought to the hospital. Currently being treated for severe sepsis from Pseudomonas UTI. Assessment and Plan: Severe sepsis from Pseudomonas UTI. BPH Met SIRS criteria with fever, tachypnea.  Evidence of infection in the urine.  Organ damage with AKI, lactic acidosis as well as hypotension. Treated with IV fluid.  Responded well.  No evidence of shock. Given Pseudomonas, continue with IV antibiotic for now. Urology consulted. Concern for colovesical fistula. Recommendation is for conservative management. Foley catheter placed for decompression.  Colovesical fistula. CT abdomen pelvis with and without contrast shows evidence of large amount of gas and debris's in the urinary bladder along with moderate right-sided hydronephrosis.  As well as concern for colovesical fistula. General surgery was consulted as well as urology. Both recommending conservative measures with treatment of sepsis for now. Recently had a cystoscopy which did not show any evidence of colovesical fistula. Surgery recommending colonoscopy, cardiology clearance as well as medical clearance for him to undergo any surgical intervention that would require fixation of his colovesical fistula. Monitor for now. Currently diet is advanced to full liquid diet per surgery.  Recurrent fall. Most likely syncope in the setting of sepsis and  hypotension. Similar presentation recently which improved with hydration and treatment of sepsis. Will monitor for now.  Chronic HFrEF. HTN Appears short of breath.  Does not appear volume overloaded. Currently holding GDMT given presentation with hypotension.  OSA. On CPAP.  Will continue.  Chronic tremors. On my sodium.  Continue.  History of dementia. Delirium secondary to acute metabolic encephalopathy. Mentation improving.  Will monitor. Fall precaution.  Bipolar disorder. On home regimen including Depakote, BuSpar, trazodone, Remeron and Abilify. For now we will continue on monitor.  HLD. On Pravachol.  Continue.  Concern for CKD-ruled out. GFR appears to be more than 60 right now. Serum creatinine 1.07. Most likely presentation actually associated with mild renal insufficiency with serum creatinine 1.25.   Obesity Class 1 Body mass index is 31.8 kg/m.  Placing the pt at higher risk of poor outcomes.   Subjective: No nausea no vomiting no fever no chills.  Ongoing cough and shortness of breath.  No abdomen pain.  Physical Exam: General: in moderate distress, No Rash Cardiovascular: S1 and S2 Present, No Murmur Respiratory: Increased respiratory effort, Bilateral Air entry present.  Basal crackles, No wheezes Abdomen: Bowel Sound present, No tenderness Extremities: Trace edema Neuro: Alert and oriented x3, no new focal deficit  Data Reviewed: I have Reviewed nursing notes, Vitals, and Lab results. Since last encounter, pertinent lab results CBC and BMP   . I have ordered test including CBC and BMP  .   Disposition: Status is: Inpatient Remains inpatient appropriate because: Monitor for improvement in respiratory status and sepsis  Place and maintain sequential compression device Start: 06/02/23 1250   Family Communication: No one at bedside Level of care: Progressive continue Vitals:   06/02/23 1842 06/02/23 2143 06/03/23 0209 06/03/23 0403  BP:  Marland Kitchen)  155/86 133/68 (!) 143/75  Pulse:  79 78 84  Resp: 16     Temp: (!) 100.7 F (38.2 C) 99 F (37.2 C) 99.5 F (37.5 C) 100 F (37.8 C)  TempSrc: Axillary Oral Oral Oral  SpO2:  95% 100% 100%  Weight:      Height:         Author: Lynden Oxford, MD 06/03/2023 7:20 PM  Please look on [email or web address] to find out who is on call.

## 2023-06-03 NOTE — Evaluation (Signed)
Physical Therapy Evaluation Patient Details Name: Calvin Escobar MRN: [id number] DOB: August 08, 1950 Today's Date: 06/03/2023  History of Present Illness  72 y.o. male admitteed with continued falls and weakness since last admission. recent adm 12/9-12/12 for severe sepsis secondary to E. coli bacteremia. 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 urology consulted, general surgery consulted. PMH: HFrEF (recovered), HTN, HLD, BPH, dementia, bipolar disorder, OSA on bipap, and tremors, BPH.  Clinical Impression  Pt admitted with above diagnosis.  Pt is agreeable and cooperative with PT. Amb 100' with RW and CGA for balance, requiring cues for RW safety throughout. Pt states he feels "great!" And pleased to be OOB. Recommend HHPT  SpO2 95-99% on RA, RN aware (on 3L on arrival )  Pt currently with functional limitations due to the deficits listed below (see PT Problem List). Pt will benefit from acute skilled PT to increase their independence and safety with mobility to allow discharge.           If plan is discharge home, recommend the following: A little help with walking and/or transfers;A little help with bathing/dressing/bathroom;Assistance with cooking/housework;Direct supervision/assist for medications management;Assist for transportation;Help with stairs or ramp for entrance   Can travel by private vehicle        Equipment Recommendations None recommended by PT (states he has RW)  Recommendations for Other Services       Functional Status Assessment Patient has had a recent decline in their functional status and demonstrates the ability to make significant improvements in function in a reasonable and predictable amount of time.     Precautions / Restrictions Precautions Precautions: Fall Restrictions Weight Bearing Restrictions Per Provider Order: No      Mobility   Bed Mobility   Bed Mobility: Supine to Sit     Supine to sit: Supervision, HOB elevated Sit to supine: Supervision, Contact guard assist   General bed mobility comments: incr time needed, CGA to elevate trunk safely    Transfers Overall transfer level: Needs assistance Equipment used: Rolling walker (2 wheels) Transfers: Sit to/from Stand Sit to Stand: Contact guard assist           General transfer comment: from low EOB with cues for hand placement and to slow speed of movement    Ambulation/Gait Ambulation/Gait assistance: Contact guard assist Gait Distance (Feet): 100 Feet Assistive device: Rolling walker (2 wheels) Gait Pattern/deviations: Step-through pattern       General Gait Details: pt requires safety cues to slow speed of movement and fro RW position/safety. mild drifting however no overt LOB  Stairs            Wheelchair Mobility     Tilt Bed    Modified Rankin (Stroke Patients Only)       Balance   Sitting-balance support: Feet supported, Bilateral upper extremity supported Sitting balance-Leahy Scale: Good     Standing balance support: During functional activity, Reliant on assistive device for balance Standing balance-Leahy Scale: Fair                               Pertinent Vitals/Pain Pain Assessment Pain Assessment: No/denies pain    Home Living Family/patient expects to be discharged to:: Private residence Living Arrangements: Spouse/significant other;Children Available Help at Discharge: Family;Available PRN/intermittently Type of Home: House Home Access: Stairs to enter Entrance Stairs-Rails: None Entrance  Stairs-Number of Steps: 6-8 Alternate Level Stairs-Number of Steps: flight Home Layout: Two level;Bed/bath upstairs Home Equipment: Rolling Walker (2 wheels) (RW given to him) Additional Comments: Pt reports sedentary lifestyle but does walk household distances. No family present, pt is questionable  historian.    Prior Function               Mobility Comments: Ind no AD-  pt reports ADLs Comments: Ind- pt reports     Extremity/Trunk Assessment   Upper Extremity Assessment Upper Extremity Assessment: Defer to OT evaluation    Lower Extremity Assessment Lower Extremity Assessment: Overall WFL for tasks assessed       Communication   Communication Communication: No apparent difficulties  Cognition Arousal: Alert Behavior During Therapy: WFL for tasks assessed/performed, Impulsive (moves very quickly) Overall Cognitive Status: History of cognitive impairments - at baseline                                 General Comments: increased cues for safety and awareness        General Comments      Exercises     Assessment/Plan    PT Assessment Patient needs continued PT services  PT Problem List Decreased activity tolerance;Decreased balance;Decreased mobility;Decreased cognition;Decreased coordination;Decreased knowledge of use of DME;Decreased safety awareness;Decreased knowledge of precautions       PT Treatment Interventions DME instruction;Gait training;Stair training;Functional mobility training;Therapeutic activities;Therapeutic exercise;Balance training;Neuromuscular re-education;Cognitive remediation;Patient/family education;Wheelchair mobility training    PT Goals (Current goals can be found in the Care Plan section)  Acute Rehab PT Goals Patient Stated Goal: to go home PT Goal Formulation: With patient Time For Goal Achievement: 05/29/23 Potential to Achieve Goals: Good    Frequency Min 1X/week     Co-evaluation               AM-PAC PT "6 Clicks" Mobility  Outcome Measure Help needed turning from your back to your side while in a flat bed without using bedrails?: A Little Help needed moving from lying on your back to sitting on the side of a flat bed without using bedrails?: A Little Help needed moving to and from a bed to a  chair (including a wheelchair)?: A Little Help needed standing up from a chair using your arms (e.g., wheelchair or bedside chair)?: A Little Help needed to walk in hospital room?: A Little Help needed climbing 3-5 steps with a railing? : A Little 6 Click Score: 18    End of Session Equipment Utilized During Treatment: Gait belt Activity Tolerance: Patient tolerated treatment well Patient left: in chair;with call bell/phone within reach;with chair alarm set Nurse Communication: Mobility status PT Visit Diagnosis: Other abnormalities of gait and mobility (R26.89)    Time: 1550-1610 PT Time Calculation (min) (ACUTE ONLY): 20 min   Charges:   PT Evaluation $PT Eval Low Complexity: 1 Low   PT General Charges $$ ACUTE PT VISIT: 1 Visit         Oni Dietzman, PT  Acute Rehab Dept [facility]) [phone]  06/03/2023   [facility] 06/03/2023, 4:41 PM

## 2023-06-03 NOTE — Hospital Course (Addendum)
PMH of HTN, HLD, BPH, HFrEF, bipolar disorder, OSA on CPAP, tremor presented to hospital with complaints of confusion and fall. 12/8 - 12/12 hospitalized for confusion and was treated for severe sepsis secondary to E. coli bacteremia from UTI-emphysematous cystitis. Has not been eating or drinking prior to admission and had worsening tremors and then had a fall and unable to get up all by himself and therefore was brought to the hospital. Currently being treated for severe sepsis from Pseudomonas UTI.  Assessment and Plan: Severe sepsis from Pseudomonas UTI. BPH Met SIRS criteria with fever, tachypnea.  Evidence of infection in the urine.  Organ damage with AKI, lactic acidosis as well as hypotension. Treated with IV fluid.  Responded well.  No evidence of shock. Given Pseudomonas, continue with IV antibiotic for now. Urology consulted. Concern for colovesical fistula. Recommendation is for conservative management. Foley catheter placed for decompression.  Colovesical fistula. CT abdomen pelvis with and without contrast shows evidence of large amount of gas and debris's in the urinary bladder along with moderate right-sided hydronephrosis.  As well as concern for colovesical fistula. General surgery was consulted as well as urology. Both recommending conservative measures with treatment of sepsis for now. Recently had a cystoscopy which did not show any evidence of colovesical fistula. Surgery recommending colonoscopy, cardiology clearance as well as medical clearance for him to undergo any surgical intervention that would require fixation of his colovesical fistula. Monitor for now. Currently diet is advanced to full liquid diet per surgery. So far no BM.  Recurrent fall. Most likely syncope in the setting of sepsis and hypotension. Similar presentation recently which improved with hydration and treatment of sepsis. Will monitor for now.  Chronic HFrEF. HTN Appears short of breath.   Does not appear volume overloaded. On Coreg at home 12.5.  Will initiate at 3.125. Holding losartan 50 mg. Not a candidate for SGLT2 inhibitor due to UTI.  OSA. On CPAP.  Will continue.  Chronic tremors. On primidone.  History of dementia. Delirium secondary to acute metabolic encephalopathy. Resolved. Mentation is back to normal. Able to ambulate.  Bipolar disorder. On home regimen including Depakote, BuSpar, trazodone, Remeron and Abilify. For now we will continue on monitor.  HLD. On Pravachol.  Continue.  Concern for CKD-ruled out. GFR appears to be more than 60 right now. Serum creatinine 1.07. Most likely presentation actually associated with mild renal insufficiency with serum creatinine 1.25.   Obesity Class 1 Body mass index is 31.8 kg/m.  Placing the pt at higher risk of poor outcomes.

## 2023-06-04 DIAGNOSIS — A419 Sepsis, unspecified organism: Secondary | ICD-10-CM | POA: Diagnosis not present

## 2023-06-04 DIAGNOSIS — R6521 Severe sepsis with septic shock: Secondary | ICD-10-CM | POA: Diagnosis not present

## 2023-06-04 LAB — RENAL FUNCTION PANEL
Albumin: 2.4 g/dL — ABNORMAL LOW (ref 3.5–5.0)
Anion gap: 4 — ABNORMAL LOW (ref 5–15)
BUN: 15 mg/dL (ref 8–23)
CO2: 28 mmol/L (ref 22–32)
Calcium: 8 mg/dL — ABNORMAL LOW (ref 8.9–10.3)
Chloride: 104 mmol/L (ref 98–111)
Creatinine, Ser: 0.94 mg/dL (ref 0.61–1.24)
GFR, Estimated: >60 mL/min (ref >60)
Glucose, Bld: 115 mg/dL — ABNORMAL HIGH (ref 70–99)
Phosphorus: 3.1 mg/dL (ref 2.5–4.6)
Potassium: 4 mmol/L (ref 3.5–5.1)
Sodium: 136 mmol/L (ref 135–145)

## 2023-06-04 LAB — CBC WITH DIFFERENTIAL/PLATELET
Abs Immature Granulocytes: 0.01 10*3/uL (ref 0.00–0.07)
Basophils Absolute: 0.1 10*3/uL (ref 0.0–0.1)
Basophils Relative: 1 %
Eosinophils Absolute: 0.3 10*3/uL (ref 0.0–0.5)
Eosinophils Relative: 5 %
HCT: 27.4 % — ABNORMAL LOW (ref 39.0–52.0)
Hemoglobin: 8.1 g/dL — ABNORMAL LOW (ref 13.0–17.0)
Immature Granulocytes: 0 %
Lymphocytes Relative: 26 %
Lymphs Abs: 1.3 10*3/uL (ref 0.7–4.0)
MCH: 27.4 pg (ref 26.0–34.0)
MCHC: 29.6 g/dL — ABNORMAL LOW (ref 30.0–36.0)
MCV: 92.6 fL (ref 80.0–100.0)
Monocytes Absolute: 0.7 10*3/uL (ref 0.1–1.0)
Monocytes Relative: 13 %
Neutro Abs: 2.8 10*3/uL (ref 1.7–7.7)
Neutrophils Relative %: 55 %
Platelets: 91 10*3/uL — ABNORMAL LOW (ref 150–400)
RBC: 2.96 MIL/uL — ABNORMAL LOW (ref 4.22–5.81)
RDW: 17.1 % — ABNORMAL HIGH (ref 11.5–15.5)
WBC: 5.1 10*3/uL (ref 4.0–10.5)
nRBC: 0 % (ref 0.0–0.2)

## 2023-06-04 MED ORDER — ENSURE ENLIVE PO LIQD
237.0000 mL | Freq: Two times a day (BID) | ORAL | Status: DC
Start: 2023-06-04 — End: 2023-06-05
  Administered 2023-06-04 – 2023-06-05 (×3): 237 mL via ORAL

## 2023-06-04 MED ORDER — CARVEDILOL 3.125 MG PO TABS
3.1250 mg | ORAL_TABLET | Freq: Two times a day (BID) | ORAL | Status: DC
Start: 2023-06-04 — End: 2023-06-05
  Administered 2023-06-04 – 2023-06-05 (×2): 3.125 mg via ORAL
  Filled 2023-06-04 (×2): qty 1

## 2023-06-04 NOTE — Progress Notes (Signed)
 RT placed pt on CPAP at this time. Pt set on 6 cm H20 w/3 lpm bled into the system. Pt tolerating well at this time.     06/04/23 0000  BiPAP/CPAP/SIPAP  $ Non-Invasive Ventilator  Non-Invasive Vent Set Up;Non-Invasive Vent Initial  $ Face Mask Medium Yes  BiPAP/CPAP/SIPAP Pt Type Adult  BiPAP/CPAP/SIPAP DREAMSTATIOND  Mask Type Full face mask  Mask Size Medium  EPAP 6 cmH2O  Flow Rate 3 lpm  Patient Home Equipment No  CPAP/SIPAP surface wiped down Yes

## 2023-06-04 NOTE — Progress Notes (Signed)
 Subjective/Chief Complaint: Mildly confused Reports no abdominal pain Foley in place and draining Normal WBC Afebrile   Objective: Vital signs in last 24 hours: Temp:  [97.9 F (36.6 C)-98.2 F (36.8 C)] 98.2 F (36.8 C) (12/31 9391) Pulse Rate:  [73-82] 82 (12/31 0609) Resp:  [16-18] 16 (12/31 0754) BP: (119-124)/(69-78) 119/78 (12/31 0609) SpO2:  [100 %] 100 % (12/31 0609) Last BM Date : 06/02/23  Intake/Output from previous day: 12/30 0701 - 12/31 0700 In: 2098.6 [P.O.:960; I.V.:1138.6] Out: 1650 [Urine:1650] Intake/Output this shift: No intake/output data recorded.  Elderly male - NAD Mildly confused Abd - soft, non-tender   Lab Results:  Recent Labs    06/02/23 1523 06/03/23 0432  WBC 9.8 8.5  HGB 8.9* 8.7*  HCT 29.7* 30.0*  PLT 111* 91*   BMET Recent Labs    06/02/23 0524 06/02/23 0956 06/03/23 0432  NA 137 138 133*  K 4.0 4.0 4.0  CL 103 102 103  CO2 24  --  24  GLUCOSE 153* 124* 105*  BUN 15 19 16   CREATININE 1.25* 1.30* 1.07  CALCIUM  8.3*  --  7.7*   PT/INR Recent Labs    06/02/23 0524  LABPROT 15.0  INR 1.2   ABG No results for input(s): PHART, HCO3 in the last 72 hours.  Invalid input(s): PCO2, PO2  Studies/Results: CT ABDOMEN PELVIS W WO CONTRAST Result Date: 06/02/2023 CLINICAL DATA:  72 year old male with history of dementia. Suspected urolithiasis. EXAM: CT ABDOMEN AND PELVIS WITHOUT AND WITH CONTRAST TECHNIQUE: Multidetector CT imaging of the abdomen and pelvis was performed following the standard protocol before and following the bolus administration of intravenous contrast. RADIATION DOSE REDUCTION: This exam was performed according to the departmental dose-optimization program which includes automated exposure control, adjustment of the mA and/or kV according to patient size and/or use of iterative reconstruction technique. CONTRAST:  75mL OMNIPAQUE  IOHEXOL  350 MG/ML SOLN COMPARISON:  CT of the abdomen and pelvis  05/16/2023. FINDINGS: Lower chest: Calcified granuloma in the right middle lobe. Atherosclerotic calcifications in the left main, left anterior descending and right coronary arteries. Hepatobiliary: No definite suspicious cystic or solid hepatic lesions are confidently identified on today's noncontrast CT examination. Unenhanced appearance of the gallbladder is unremarkable. Pancreas: No definite pancreatic mass or peripancreatic fluid collections or inflammatory changes are noted on today's noncontrast CT examination. Spleen: Unremarkable. Adrenals/Urinary Tract: There is a large amount of gas present in the nondependent aspect of the urinary bladder. There also appears to be some debris dependently in the urinary bladder, which also contains a small amount of gas (best appreciated on axial image 91 of series 3). Superior aspect of the urinary bladder appears thickened and intimately associated with the adjacent sigmoid colon, centered around what appears to be a large diverticulum, best appreciated on coronal image 56 of series 10. Small amount of gas in the superior wall of the urinary bladder (coronal image 57 of series 10). Moderate right and mild left hydroureteronephrosis. Small amount of gas within the collecting systems of both kidneys (right greater than left). Unenhanced appearance of the kidneys and bilateral adrenal glands is otherwise unremarkable. Stomach/Bowel: Unenhanced appearance of the stomach is normal. No pathologic dilatation of small bowel or colon. 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 Normal appendix. Vascular/Lymphatic: Atherosclerosis in the abdominal aorta and pelvic vasculature. No lymphadenopathy noted in the abdomen or pelvis. Reproductive: Prostate gland and seminal vesicles are unremarkable in appearance. Other: No significant volume of  ascites.  No pneumoperitoneum. Musculoskeletal: There are no  aggressive appearing lytic or blastic lesions noted in the visualized portions of the skeleton. IMPRESSION: 1. 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 Urologic consultation is recommended for further clinical evaluation. 2. Aortic atherosclerosis. Electronically Signed   By: Toribio Aye M.D.   On: 06/02/2023 10:19    Anti-infectives: Anti-infectives (From admission, onward)    Start     Dose/Rate Route Frequency Ordered Stop   06/03/23 0945  cefTRIAXone  (ROCEPHIN ) 2 g in sodium chloride  0.9 % 100 mL IVPB  Status:  Discontinued        2 g 200 mL/hr over 30 Minutes Intravenous Every 24 hours 06/02/23 1022 06/02/23 1405   06/02/23 1600  piperacillin -tazobactam (ZOSYN ) IVPB 3.375 g        3.375 g 12.5 mL/hr over 240 Minutes Intravenous Every 8 hours 06/02/23 1405 06/07/23 1559   06/02/23 1130  metroNIDAZOLE  (FLAGYL ) IVPB 500 mg  Status:  Discontinued        500 mg 100 mL/hr over 60 Minutes Intravenous Every 12 hours 06/02/23 1128 06/02/23 1405   06/02/23 0945  cefTRIAXone  (ROCEPHIN ) 1 g in sodium chloride  0.9 % 100 mL IVPB  Status:  Discontinued        1 g 200 mL/hr over 30 Minutes Intravenous Every 24 hours 06/02/23 0931 06/02/23 1022   06/02/23 0545  aztreonam  (AZACTAM ) 1 g in sodium chloride  0.9 % 100 mL IVPB  Status:  Discontinued        1 g 200 mL/hr over 30 Minutes Intravenous  Once 06/02/23 0534 06/02/23 0534   06/02/23 0545  aztreonam  (AZACTAM ) 2 g in sodium chloride  0.9 % 100 mL IVPB        2 g 200 mL/hr over 30 Minutes Intravenous  Once 06/02/23 0535 06/02/23 9352       Assessment/Plan:  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  Will hopefully be able to transition to outpatient work-up with colonoscopy, cardiac clearance.  Eventual elective colon resection with takedown of colovesical fistula.  Currently, no indications for urgent surgical intervention.  FTE - Cont full liquids until better bowel function  Principal Problem:   Septic shock from UTI Active Problems:   Bipolar affective disorder, depressed (HCC)   Dementia (HCC)   Hyperlipidemia   OSA treated with BiPAP   HFrEF (heart failure with reduced ejection fraction) (HCC)   Benign prostatic hyperplasia with urinary obstruction   Stage 3 chronic kidney disease (HCC)   Normocytic anemia   Tremor   Acute respiratory failure with hypoxia (HCC)   Thrombocytopenia (HCC)   LBBB (left bundle branch block)    LOS: 1 day    Calvin Escobar 06/04/2023

## 2023-06-04 NOTE — Progress Notes (Signed)
   06/04/23 2248  BiPAP/CPAP/SIPAP  BiPAP/CPAP/SIPAP Pt Type Adult  Reason BIPAP/CPAP not in use Non-compliant (Patient refused use of CPAP tonight.  machine is in the room on stand-by should he change his mind.)

## 2023-06-04 NOTE — Progress Notes (Signed)
 [facility] Progress Note Patient: Calvin Escobar FMW:[id number] DOB: Oct 05, 1950 DOA: 06/02/2023  DOS: the patient was seen and examined on 06/04/2023  Brief Hospital Course: PMH of HTN, HLD, BPH, HFrEF, bipolar disorder, OSA on CPAP, tremor presented to hospital with complaints of confusion and fall. 12/8 - 12/12 hospitalized for confusion and was treated for severe sepsis secondary to E. coli bacteremia from UTI-emphysematous cystitis. Has not been eating or drinking prior to admission and had worsening tremors and then had a fall and unable to get up all by himself and therefore was brought to the hospital. Currently being treated for severe sepsis from Pseudomonas UTI.  Assessment and Plan: Severe sepsis from Pseudomonas UTI. BPH Met SIRS criteria with fever, tachypnea.  Evidence of infection in the urine.  Organ damage with AKI, lactic acidosis as well as hypotension. Treated with IV fluid.  Responded well.  No evidence of shock. Given Pseudomonas, continue with IV antibiotic for now. Urology consulted. Concern for colovesical fistula. Recommendation is for conservative management. Foley catheter placed for decompression.  Colovesical fistula. CT abdomen pelvis with and without contrast shows evidence of large amount of gas and debris's in the urinary bladder along with moderate right-sided hydronephrosis.  As well as concern for colovesical fistula. General surgery was consulted as well as urology. Both recommending conservative measures with treatment of sepsis for now. Recently had a cystoscopy which did not show any evidence of colovesical fistula. Surgery recommending colonoscopy, cardiology clearance as well as medical clearance for him to undergo any surgical intervention that would require fixation of his colovesical fistula. Monitor for now. Currently diet is advanced to full liquid diet per surgery. So far no BM.  Recurrent fall. Most likely syncope in the setting of  sepsis and hypotension. Similar presentation recently which improved with hydration and treatment of sepsis. Will monitor for now.  Chronic HFrEF. HTN Appears short of breath.  Does not appear volume overloaded. On Coreg  at home 12.5.  Will initiate at 3.125. Holding losartan  50 mg. Not a candidate for SGLT2 inhibitor due to UTI.  OSA. On CPAP.  Will continue.  Chronic tremors. On primidone .  History of dementia. Delirium secondary to acute metabolic encephalopathy. Resolved. Mentation is back to normal. Able to ambulate.  Bipolar disorder. On home regimen including Depakote , BuSpar , trazodone , Remeron  and Abilify . For now we will continue on monitor.  HLD. On Pravachol .  Continue.  Concern for CKD-ruled out. GFR appears to be more than 60 right now. Serum creatinine 1.07. Most likely presentation actually associated with mild renal insufficiency with serum creatinine 1.25.   Obesity Class 1 Body mass index is 31.8 kg/m.  Placing the pt at higher risk of poor outcomes.   Subjective: Feeling better.  Ambulated 500 feet with minimal assist today.  No nausea no vomiting or breathing okay.  No BM today.  Not passing gas.  Physical Exam: General: in Mild distress, No Rash Cardiovascular: S1 and S2 Present, No Murmur Respiratory: Good respiratory effort, Bilateral Air entry present. No Crackles, No wheezes Abdomen: Bowel Sound present, No tenderness Extremities: No edema Neuro: Alert and oriented x3, no new focal deficit  Data Reviewed: I have Reviewed nursing notes, Vitals, and Lab results. Since last encounter, pertinent lab results CBC and BMP   . I have ordered test including CBC and BMP  .   Disposition: Status is: Inpatient Remains inpatient appropriate because: Currently on IV antibiotic.  Need culture clearance.  Likely be able to leave either tomorrow or day after.  Place and maintain sequential compression device Start: 06/02/23 1250   Family  Communication: Discussed with wife on the phone. Level of care: Progressive   Vitals:   06/04/23 0608 06/04/23 0609 06/04/23 0754 06/04/23 1011  BP:  119/78    Pulse:  82    Resp:  18 16   Temp: 98.2 F (36.8 C)     TempSrc: Oral     SpO2:  100%  97%  Weight:      Height:         Author: Yetta Blanch, MD 06/04/2023 11:44 AM  Please look on [email or web address] to find out who is on call.

## 2023-06-04 NOTE — Progress Notes (Signed)
 Mobility Specialist - Progress Note   06/04/23 1011  Oxygen Therapy  SpO2 97 %  O2 Device Nasal Cannula  Patient Activity (if Appropriate) Ambulating  Mobility  Activity Ambulated with assistance in hallway  Level of Assistance Standby assist, set-up cues, supervision of patient - no hands on  Assistive Device Front wheel walker  Distance Ambulated (ft) 500 ft  Activity Response Tolerated well  Mobility Referral Yes  Mobility visit 1 Mobility  Mobility Specialist Start Time (ACUTE ONLY) [id number]  Mobility Specialist Stop Time (ACUTE ONLY) 1008  Mobility Specialist Time Calculation (min) (ACUTE ONLY) 17 min   Pt received in bed and agreeable to mobility. Pt was minA from STS. No complaints during session. Pt to recliner after session with all needs met. Chair alarm on.    Pre-mobility: 91% SpO2 During mobility: 106 HR, 97% SpO2 Post-mobility: 83 HR, 99% SPO2  Chief Technology Officer

## 2023-06-05 ENCOUNTER — Inpatient Hospital Stay (HOSPITAL_COMMUNITY): Payer: Medicare HMO

## 2023-06-05 DIAGNOSIS — R6521 Severe sepsis with septic shock: Secondary | ICD-10-CM | POA: Diagnosis not present

## 2023-06-05 DIAGNOSIS — A419 Sepsis, unspecified organism: Secondary | ICD-10-CM | POA: Diagnosis not present

## 2023-06-05 LAB — CBC
HCT: 28.2 % — ABNORMAL LOW (ref 39.0–52.0)
HCT: 25.7 % — ABNORMAL LOW (ref 39.0–52.0)
Hemoglobin: 8.4 g/dL — ABNORMAL LOW (ref 13.0–17.0)
Hemoglobin: 7.7 g/dL — ABNORMAL LOW (ref 13.0–17.0)
MCH: 27.1 pg (ref 26.0–34.0)
MCH: 27.8 pg (ref 26.0–34.0)
MCHC: 29.8 g/dL — ABNORMAL LOW (ref 30.0–36.0)
MCHC: 30 g/dL (ref 30.0–36.0)
MCV: 91 fL (ref 80.0–100.0)
MCV: 92.8 fL (ref 80.0–100.0)
Platelets: 126 10*3/uL — ABNORMAL LOW (ref 150–400)
Platelets: 139 10*3/uL — ABNORMAL LOW (ref 150–400)
RBC: 3.1 MIL/uL — ABNORMAL LOW (ref 4.22–5.81)
RBC: 2.77 MIL/uL — ABNORMAL LOW (ref 4.22–5.81)
RDW: 16.7 % — ABNORMAL HIGH (ref 11.5–15.5)
RDW: 17.2 % — ABNORMAL HIGH (ref 11.5–15.5)
WBC: 6.1 10*3/uL (ref 4.0–10.5)
WBC: 13.2 10*3/uL — ABNORMAL HIGH (ref 4.0–10.5)
nRBC: 0 % (ref 0.0–0.2)
nRBC: 0 % (ref 0.0–0.2)

## 2023-06-05 LAB — RENAL FUNCTION PANEL
Albumin: 2.5 g/dL — ABNORMAL LOW (ref 3.5–5.0)
Anion gap: 7 (ref 5–15)
BUN: 15 mg/dL (ref 8–23)
CO2: 27 mmol/L (ref 22–32)
Calcium: 8 mg/dL — ABNORMAL LOW (ref 8.9–10.3)
Chloride: 101 mmol/L (ref 98–111)
Creatinine, Ser: 0.98 mg/dL (ref 0.61–1.24)
GFR, Estimated: >60 mL/min (ref >60)
Glucose, Bld: 106 mg/dL — ABNORMAL HIGH (ref 70–99)
Phosphorus: 2.4 mg/dL — ABNORMAL LOW (ref 2.5–4.6)
Potassium: 3.5 mmol/L (ref 3.5–5.1)
Sodium: 135 mmol/L (ref 135–145)

## 2023-06-05 LAB — MRSA NEXT GEN BY PCR, NASAL: MRSA by PCR Next Gen: NOT DETECTED

## 2023-06-05 LAB — LACTIC ACID, PLASMA: Lactic Acid, Venous: 1.6 mmol/L (ref 0.5–1.9)

## 2023-06-05 LAB — BRAIN NATRIURETIC PEPTIDE: B Natriuretic Peptide: 168.9 pg/mL — ABNORMAL HIGH (ref 0.0–100.0)

## 2023-06-05 LAB — MAGNESIUM: Magnesium: 2.1 mg/dL (ref 1.7–2.4)

## 2023-06-05 MED ORDER — MAGNESIUM SULFATE 2 GM/50ML IV SOLN
2.0000 g | Freq: Once | INTRAVENOUS | Status: AC
  Administered 2023-06-06: 2 g via INTRAVENOUS
  Filled 2023-06-05: qty 50

## 2023-06-05 MED ORDER — METOPROLOL TARTRATE 25 MG PO TABS: 25.0000 mg | ORAL_TABLET | Freq: Two times a day (BID) | ORAL | Status: DC

## 2023-06-05 MED ORDER — METOPROLOL TARTRATE 5 MG/5ML IV SOLN: 2.5000 mg | INTRAVENOUS | Status: DC | PRN

## 2023-06-05 MED ORDER — IPRATROPIUM-ALBUTEROL 0.5-2.5 (3) MG/3ML IN SOLN
3.0000 mL | RESPIRATORY_TRACT | Status: DC | PRN
  Administered 2023-06-05: 3 mL via RESPIRATORY_TRACT
  Filled 2023-06-05: qty 3

## 2023-06-05 MED ORDER — NOREPINEPHRINE 4 MG/250ML-% IV SOLN: 2.0000 ug/min | INTRAVENOUS | Status: DC

## 2023-06-05 MED ORDER — SODIUM CHLORIDE 0.9 % IV BOLUS
500.0000 mL | Freq: Once | INTRAVENOUS | Status: AC
  Administered 2023-06-05: 500 mL via INTRAVENOUS

## 2023-06-05 MED ORDER — MORPHINE SULFATE (PF) 2 MG/ML IV SOLN: 2.0000 mg | INTRAVENOUS | Status: DC | PRN

## 2023-06-05 MED ORDER — GABAPENTIN 300 MG PO CAPS
300.0000 mg | ORAL_CAPSULE | Freq: Every day | ORAL | Status: DC
  Administered 2023-06-06 – 2023-06-10 (×5): 300 mg via ORAL
  Filled 2023-06-05 (×5): qty 1

## 2023-06-05 MED ORDER — MORPHINE SULFATE (PF) 2 MG/ML IV SOLN
2.0000 mg | INTRAVENOUS | Status: DC | PRN
  Administered 2023-06-05: 2 mg via INTRAVENOUS
  Filled 2023-06-05: qty 1

## 2023-06-05 MED ORDER — METOPROLOL TARTRATE 5 MG/5ML IV SOLN
5.0000 mg | INTRAVENOUS | Status: DC
  Administered 2023-06-05: 5 mg via INTRAVENOUS
  Filled 2023-06-05: qty 5

## 2023-06-05 MED ORDER — ALBUMIN HUMAN 5 % IV SOLN
25.0000 g | Freq: Once | INTRAVENOUS | Status: AC
  Administered 2023-06-05: 25 g via INTRAVENOUS
  Filled 2023-06-05: qty 500

## 2023-06-05 MED ORDER — NOREPINEPHRINE 4 MG/250ML-% IV SOLN
INTRAVENOUS | Status: AC
  Administered 2023-06-05: 2 ug/min via INTRAVENOUS
  Filled 2023-06-05: qty 250

## 2023-06-05 MED ORDER — LORAZEPAM 2 MG/ML IJ SOLN
INTRAMUSCULAR | Status: AC
  Administered 2023-06-05: 0.5 mg
  Filled 2023-06-05: qty 1

## 2023-06-05 MED ORDER — IPRATROPIUM BROMIDE 0.02 % IN SOLN
0.5000 mg | Freq: Four times a day (QID) | RESPIRATORY_TRACT | Status: DC
  Administered 2023-06-05 (×2): 0.5 mg via RESPIRATORY_TRACT
  Filled 2023-06-05 (×2): qty 2.5

## 2023-06-05 MED ORDER — POTASSIUM PHOSPHATES 15 MMOLE/5ML IV SOLN: 30.0000 mmol | Freq: Once | INTRAVENOUS | Status: DC

## 2023-06-05 MED ORDER — MIDODRINE HCL 5 MG PO TABS: 10.0000 mg | ORAL_TABLET | ORAL | Status: DC

## 2023-06-05 MED ORDER — PRAVASTATIN SODIUM 20 MG PO TABS: 20.0000 mg | ORAL_TABLET | Freq: Every day | ORAL | Status: DC

## 2023-06-05 MED ORDER — MORPHINE SULFATE (PF) 2 MG/ML IV SOLN
2.0000 mg | INTRAVENOUS | Status: DC | PRN
Start: 2023-06-05 — End: 2023-06-05

## 2023-06-05 MED ORDER — SODIUM CHLORIDE 0.9 % IV SOLN
250.0000 mL | INTRAVENOUS | Status: DC
  Administered 2023-06-05: 250 mL via INTRAVENOUS

## 2023-06-05 MED ORDER — METOPROLOL TARTRATE 5 MG/5ML IV SOLN
INTRAVENOUS | Status: AC
  Administered 2023-06-05: 5 mg via INTRAVENOUS
  Filled 2023-06-05: qty 5

## 2023-06-05 MED ORDER — ENSURE ENLIVE PO LIQD
237.0000 mL | Freq: Three times a day (TID) | ORAL | Status: DC
  Administered 2023-06-06 – 2023-06-11 (×15): 237 mL via ORAL

## 2023-06-05 MED ORDER — AMOXICILLIN-POT CLAVULANATE 875-125 MG PO TABS
1.0000 | ORAL_TABLET | Freq: Two times a day (BID) | ORAL | Status: DC
  Administered 2023-06-05: 1 via ORAL
  Filled 2023-06-05: qty 1

## 2023-06-05 MED ORDER — LACTATED RINGERS IV SOLN: Freq: Once | INTRAVENOUS | Status: AC

## 2023-06-05 MED ORDER — PIPERACILLIN-TAZOBACTAM 3.375 G IVPB
3.3750 g | Freq: Three times a day (TID) | INTRAVENOUS | Status: AC
Start: 2023-06-05 — End: 2023-06-10
  Administered 2023-06-05 – 2023-06-10 (×15): 3.375 g via INTRAVENOUS
  Filled 2023-06-05 (×15): qty 50

## 2023-06-05 MED ORDER — LEVALBUTEROL HCL 0.63 MG/3ML IN NEBU
0.6300 mg | INHALATION_SOLUTION | Freq: Four times a day (QID) | RESPIRATORY_TRACT | Status: DC
  Administered 2023-06-05 (×2): 0.63 mg via RESPIRATORY_TRACT
  Filled 2023-06-05 (×2): qty 3

## 2023-06-05 MED ORDER — LORAZEPAM 2 MG/ML IJ SOLN
0.5000 mg | INTRAMUSCULAR | Status: DC | PRN
  Administered 2023-06-05 – 2023-06-07 (×2): 0.5 mg via INTRAVENOUS
  Filled 2023-06-05 (×2): qty 1

## 2023-06-05 MED ORDER — IPRATROPIUM BROMIDE 0.02 % IN SOLN
0.5000 mg | Freq: Four times a day (QID) | RESPIRATORY_TRACT | Status: DC | PRN
  Administered 2023-06-06 – 2023-06-07 (×2): 0.5 mg via RESPIRATORY_TRACT
  Filled 2023-06-05 (×2): qty 2.5

## 2023-06-05 MED ORDER — TRAZODONE HCL 50 MG PO TABS
150.0000 mg | ORAL_TABLET | Freq: Every day | ORAL | Status: DC
  Administered 2023-06-06 – 2023-06-10 (×5): 150 mg via ORAL
  Filled 2023-06-05 (×4): qty 1
  Filled 2023-06-05: qty 3

## 2023-06-05 MED ORDER — METOPROLOL TARTRATE 5 MG/5ML IV SOLN: 5.0000 mg | Freq: Once | INTRAVENOUS | Status: AC

## 2023-06-05 MED ORDER — POTASSIUM PHOSPHATES 15 MMOLE/5ML IV SOLN
30.0000 mmol | Freq: Once | INTRAVENOUS | Status: AC
  Administered 2023-06-06: 30 mmol via INTRAVENOUS
  Filled 2023-06-05: qty 10

## 2023-06-05 MED ORDER — FUROSEMIDE 10 MG/ML IJ SOLN
40.0000 mg | Freq: Once | INTRAMUSCULAR | Status: AC
  Administered 2023-06-05: 40 mg via INTRAVENOUS
  Filled 2023-06-05: qty 4

## 2023-06-05 MED ORDER — LOPERAMIDE HCL 2 MG PO CAPS: 2.0000 mg | ORAL_CAPSULE | ORAL | Status: DC | PRN

## 2023-06-05 MED ORDER — ALBUMIN HUMAN 5 % IV SOLN
12.5000 g | Freq: Once | INTRAVENOUS | Status: AC
  Administered 2023-06-05: 12.5 g via INTRAVENOUS
  Filled 2023-06-05: qty 250

## 2023-06-05 MED ORDER — KETOROLAC TROMETHAMINE 15 MG/ML IJ SOLN: 15.0000 mg | Freq: Four times a day (QID) | INTRAMUSCULAR | Status: DC | PRN

## 2023-06-05 MED ORDER — SACCHAROMYCES BOULARDII 250 MG PO CAPS
250.0000 mg | ORAL_CAPSULE | Freq: Two times a day (BID) | ORAL | Status: DC
  Administered 2023-06-06 – 2023-06-11 (×11): 250 mg via ORAL
  Filled 2023-06-05 (×11): qty 1

## 2023-06-05 MED ORDER — KETOROLAC TROMETHAMINE 15 MG/ML IJ SOLN: 15.0000 mg | Freq: Four times a day (QID) | INTRAMUSCULAR | Status: AC | PRN

## 2023-06-05 MED ORDER — ENOXAPARIN SODIUM 40 MG/0.4ML IJ SOSY
40.0000 mg | PREFILLED_SYRINGE | INTRAMUSCULAR | Status: DC
  Administered 2023-06-06: 40 mg via SUBCUTANEOUS
  Filled 2023-06-05: qty 0.4

## 2023-06-05 MED ORDER — FAMOTIDINE 20 MG PO TABS
20.0000 mg | ORAL_TABLET | Freq: Two times a day (BID) | ORAL | Status: DC
  Administered 2023-06-06 – 2023-06-11 (×11): 20 mg via ORAL
  Filled 2023-06-05 (×11): qty 1

## 2023-06-05 MED ORDER — KETOROLAC TROMETHAMINE 15 MG/ML IJ SOLN
15.0000 mg | Freq: Once | INTRAMUSCULAR | Status: AC
  Administered 2023-06-05: 15 mg via INTRAVENOUS
  Filled 2023-06-05: qty 1

## 2023-06-05 MED ORDER — MIDODRINE HCL 5 MG PO TABS
5.0000 mg | ORAL_TABLET | Freq: Three times a day (TID) | ORAL | Status: DC
  Administered 2023-06-06 – 2023-06-11 (×16): 5 mg via ORAL
  Filled 2023-06-05 (×15): qty 1

## 2023-06-05 NOTE — Progress Notes (Signed)
 Rapid Response called for patient in 1414. Patient tachy, increased WOB. Patient slid up in bed,Placed on BIPAP 10/5 R-12 30%. Transferred to ICU, 1231.

## 2023-06-05 NOTE — Progress Notes (Addendum)
 Subjective/Chief Complaint: Patient has had several bowel movements Foley remains in place WBC is normal No new complaints  Objective: Vital signs in last 24 hours: Temp:  [97.5 F (36.4 C)-98.1 F (36.7 C)] 98.1 F (36.7 C) (01/01 0406) Pulse Rate:  [73-85] 85 (01/01 0406) Resp:  [18] 18 (01/01 0406) BP: (120-160)/(81-90) 160/90 (01/01 0406) SpO2:  [97 %-99 %] 98 % (01/01 0406) Last BM Date : 06/04/23  Intake/Output from previous day: 12/31 0701 - 01/01 0700 In: 780.1 [P.O.:480; IV Piggyback:300.1] Out: 550 [Urine:550] Intake/Output this shift: No intake/output data recorded.  Elderly male - NAD Mildly confused Abd - soft, non-tender  Lab Results:  Recent Labs    06/04/23 0925 06/05/23 0503  WBC 5.1 6.1  HGB 8.1* 8.4*  HCT 27.4* 28.2*  PLT 91* 126*   BMET Recent Labs    06/04/23 0925 06/05/23 0503  NA 136 135  K 4.0 3.5  CL 104 101  CO2 28 27  GLUCOSE 115* 106*  BUN 15 15  CREATININE 0.94 0.98  CALCIUM  8.0* 8.0*   Anti-infectives: Anti-infectives (From admission, onward)    Start     Dose/Rate Route Frequency Ordered Stop   06/05/23 1000  amoxicillin -clavulanate (AUGMENTIN ) 875-125 MG per tablet 1 tablet        1 tablet Oral Every 12 hours 06/05/23 0757     06/03/23 0945  cefTRIAXone  (ROCEPHIN ) 2 g in sodium chloride  0.9 % 100 mL IVPB  Status:  Discontinued        2 g 200 mL/hr over 30 Minutes Intravenous Every 24 hours 06/02/23 1022 06/02/23 1405   06/02/23 1600  piperacillin -tazobactam (ZOSYN ) IVPB 3.375 g  Status:  Discontinued        3.375 g 12.5 mL/hr over 240 Minutes Intravenous Every 8 hours 06/02/23 1405 06/05/23 0757   06/02/23 1130  metroNIDAZOLE  (FLAGYL ) IVPB 500 mg  Status:  Discontinued        500 mg 100 mL/hr over 60 Minutes Intravenous Every 12 hours 06/02/23 1128 06/02/23 1405   06/02/23 0945  cefTRIAXone  (ROCEPHIN ) 1 g in sodium chloride  0.9 % 100 mL IVPB  Status:  Discontinued        1 g 200 mL/hr over 30 Minutes  Intravenous Every 24 hours 06/02/23 0931 06/02/23 1022   06/02/23 0545  aztreonam  (AZACTAM ) 1 g in sodium chloride  0.9 % 100 mL IVPB  Status:  Discontinued        1 g 200 mL/hr over 30 Minutes Intravenous  Once 06/02/23 0534 06/02/23 0534   06/02/23 0545  aztreonam  (AZACTAM ) 2 g in sodium chloride  0.9 % 100 mL IVPB        2 g 200 mL/hr over 30 Minutes Intravenous  Once 06/02/23 0535 06/02/23 9352       Assessment/Plan: 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  Will hopefully be able to transition to outpatient work-up with colonoscopy, cardiac clearance.  Eventual elective colon resection with takedown of colovesical fistula.   Currently, no indications for urgent surgical intervention.   FTE - Advance to soft diet   Principal Problem:   Septic shock from UTI Active Problems:   Bipolar affective disorder, depressed (HCC)   Dementia (HCC)   Hyperlipidemia   OSA treated with BiPAP   HFrEF (heart failure with reduced ejection fraction) (HCC)   Benign prostatic  hyperplasia with urinary obstruction   Stage 3 chronic kidney disease (HCC)   Normocytic anemia   Tremor   Acute respiratory failure with hypoxia (HCC)   Thrombocytopenia (HCC)   LBBB (left bundle branch block)  LOS: 2 days    Calvin Escobar 06/05/2023

## 2023-06-05 NOTE — Progress Notes (Addendum)
 Pt has been running hypotensive the last few hours. MD made aware, order for 1L bolus and Albumin . Other orders tweaked as well. MD stated he is ok with MAP being (60) and above. MD stated it was ok to start peripheral Levo if needed.  Pt also taking Bipap mask off himself. MD ok with that, patient placed on 2L [place]. Will continue to monitor.

## 2023-06-05 NOTE — Significant Event (Signed)
 Rapid Response Event Note   Reason for Call :  Increased work of breathing   Initial Focused Assessment:  Patient in bed, appears in distress, increased RR and restless. Audible wheezing without stethoscope to chest. Per bedside RN, patient walked from bathroom to bed and became labored. EKG completed and delivered to MD at bedside- HR sustaining 160-170 bpm, BP stable. MD at bedside- Chest x ray, BIPAP, morphine , lasix  and lopressor  ordered and administered. SpO2 difficult to read on initial assessment as patient is agitated and restless, once calmed, reading 98% prior to transfer.  Transferred to 1231 on BIPAP with bedside RN and RT.   Follows commands, moving all extremities. States I feel bad. Wheezing bilaterally, all lung fields.   Interventions:  Transfer to SD BIPAP Lopressor  5 mg IV, Lasix  40 mg IV, 2 mg Morphine  IV.   Event Summary:   MD Notified:  Tobie MD at bedside  Call Time:  1221 Arrival Time:1227 End Time: 1300  Barnie Clover, RN

## 2023-06-05 NOTE — Plan of Care (Signed)
  Problem: Education: Goal: Knowledge of General Education information will improve Description: Including pain rating scale, medication(s)/side effects and non-pharmacologic comfort measures Outcome: Progressing   Problem: Clinical Measurements: Goal: Respiratory complications will improve Outcome: Progressing   Problem: Pain Management: Goal: General experience of comfort will improve Outcome: Progressing   Problem: Safety: Goal: Ability to remain free from injury will improve Outcome: Progressing   Problem: Skin Integrity: Goal: Risk for impaired skin integrity will decrease Outcome: Progressing

## 2023-06-05 NOTE — Progress Notes (Addendum)
 [facility] Progress Note Patient: Calvin Escobar FMW:[id number] DOB: [birth year] DOA: 06/02/2023  DOS: the patient was seen and examined on 06/05/2023  Brief Hospital Course: PMH of HTN, HLD, BPH, HFrEF, bipolar disorder, OSA on CPAP, tremor presented to hospital with complaints of confusion and fall. 12/8 - 12/12 hospitalized for confusion and was treated for severe sepsis secondary to E. coli bacteremia from UTI-emphysematous cystitis. Has not been eating or drinking prior to admission and had worsening tremors and then had a fall and unable to get up all by himself and therefore was brought to the hospital. Currently being treated for severe sepsis from Pseudomonas UTI.  Assessment and Plan: Severe sepsis from Pseudomonas UTI. BPH Met SIRS criteria with fever, tachypnea.  Evidence of infection in the urine.  Organ damage with AKI, lactic acidosis as well as hypotension. Treated with IV fluid.  Responded well.  No evidence of shock. Given Pseudomonas, continue with IV antibiotic for now.)  Patient was initially transition to Augmentin  but placed back on Pseudomonas) Urology consulted. Concern for colovesical fistula. Recommendation is for conservative management. Foley catheter placed for decompression.  Colovesical fistula. CT abdomen pelvis with and without contrast shows evidence of large amount of gas and debris's in the urinary bladder along with moderate right-sided hydronephrosis.  As well as concern for colovesical fistula. General surgery was consulted as well as urology. Both recommending conservative measures with treatment of sepsis for now. Recently had a cystoscopy which did not show any evidence of colovesical fistula. Surgery recommending colonoscopy, cardiology clearance as well as medical clearance for him to undergo any surgical intervention that would require fixation of his colovesical fistula. Reportedly patient has evidence of leakage from his Foley catheter for  urine as well as leakage of urine along with BM secondary to fistula.  Monitor.  Acute respiratory failure with hypoxia. Possible aspiration event in sleep. On 1/1 patient woke up from his nap with sudden onset of shortness of breath with upper airway wheezing.  His heart rate went up to 160s and respiratory rate was in 30s.  Patient received DuoNebs which initially improved his wheezing and shortness of breath but symptoms reoccurred.  His chronic tremors worsen.  Started becoming more agitated due to respiratory distress. Received IV Ativan  without any benefit. Heart rate was in 150s sustained sinus tachycardia/SVT on telemetry.  Due to respiratory distress decision was made not to use adenosine for SVT. Patient was given IV Lopressor  with minimal improvement in heart rate. Due to work of breathing patient was placed on BiPAP. Due to ongoing agitation and respiratory distress patient received IV morphine . After being transferred to stepdown unit for close observation, heart rate remains elevated.  Blood pressure was stable as patient received another dose of IV Lopressor .  Patient remained agitated and therefore received another dose of IV Ativan . Agitation and heart rate improved after this but blood pressure also significantly dropped. Received 1 L IV fluid bolus followed by IV albumin . Developed fever and therefore received Toradol . Currently blood pressure appears to be improving.  Mentation improving.  And digitation improving.  Heart rate improving therefore patient will be taken off of BiPAP. Medication adjusted to support blood pressure as well as heart rate and minimize pill burden overnight. Continue BiPAP as needed.  CPAP nightly.  Recurrent fall. Most likely syncope in the setting of sepsis and hypotension. Similar presentation recently which improved with hydration and treatment of sepsis. Will monitor for now.  Chronic HFrEF. HTN Now with hypotension Appears short of  breath.   Does not appear volume overloaded. On Coreg  at home 12.5.  Holding losartan  50 mg. Not a candidate for SGLT2 inhibitor due to UTI. Developed hypotension on 1/1.  Received 1 L fluid bolus as well as IV albumin .  Would avoid further IV hydration given his CHF history but may provide 1 more IV albumin  before initiating IV pressors. MAP more than 60 would be acceptable. May require peripheral Levophed  initiation if blood pressure remains soft.  OSA. On CPAP.  Will continue.  Chronic tremors. On primidone .  History of dementia. Delirium secondary to acute metabolic encephalopathy. Resolved. Mentation is back to normal. Able to ambulate.  Bipolar disorder. On home regimen including Depakote , BuSpar , trazodone , Remeron  and Abilify . For now we will continue on monitor.  HLD. On Pravachol .  Continue.  Concern for CKD-ruled out. GFR appears to be more than 60 right now. Serum creatinine 1.07. Most likely presentation actually associated with mild renal insufficiency with serum creatinine 1.25.   Obesity Class 1 Body mass index is 31.8 kg/m.  Placing the pt at higher risk of poor outcomes.   Subjective: Patient was seen in the morning.  No acute complaint.  No nausea no vomiting.  Later in the day had severe respite distress and agitation as mentioned above.  After being taken off of BiPAP at 6:20 PM, patient is able to follow commands and answer questions.  Spontaneously move all extremities.  Physical Exam: At the end of the day General: in mild distress, No Rash Cardiovascular: S1 and S2 Present, No Murmur Respiratory: Good respiratory effort, Bilateral Air entry present. No Crackles, No wheezes Abdomen: Bowel Sound present, No tenderness Extremities: No edema Neuro: Alert and oriented self, no new focal deficit  Data Reviewed: I have Reviewed nursing notes, Vitals, and Lab results. Since last encounter, pertinent lab results CBC and BMP   . I have ordered test including CBC  and BMP. I have ordered imaging chest x-ray. I have independently visualized and interpreted EKG which showed EKG: SVT as well as sinus tachycardia.   Disposition: Status is: Inpatient Remains inpatient appropriate because: Need improvement in respiratory distress and mentation  Place and maintain sequential compression device Start: 06/02/23 1250   Family Communication: Multiple attempts to reach spouse on the phone went to voicemail. Level of care: Stepdown transferred from floor. Vitals:   06/05/23 1700 06/05/23 1730 06/05/23 1810 06/05/23 1811  BP: (!) 84/47 (!) 85/55 (!) 94/46 (!) 94/46  Pulse: 92 (!) 106 (!) 103 (!) 105  Resp: (!) 25 (!) 26 (!) 22 (!) 27  Temp:      TempSrc:      SpO2: 99% 100% 96% 92%  Weight:      Height:        The patient is critically ill with multiple organ systems failure and requires high complexity decision making for assessment and support, frequent evaluation and titration of therapies. Critical Care Time devoted to patient care services described in this note is 35 minutes  Author: Yetta Blanch, MD 06/05/2023 6:18 PM  Please look on [email or web address] to find out who is on call.

## 2023-06-05 NOTE — TOC Initial Note (Signed)
 Transition of Care [facility]) - Initial/Assessment Note    Patient Details  Name: Calvin Escobar MRN: [id number] Date of Birth: 1951/03/17  [facility] CM/SW Contact:    Bascom Service, RN Phone Number: 06/05/2023, 10:21 AM  Clinical Narrative: Left vm w/[name] about d/c plans(no preference) [facility] rep Artavia aware. Family has own transport home.                  Expected Discharge Plan: Home w Home Health Services Barriers to Discharge: Continued Medical Work up   Patient Goals and CMS Choice Patient states their goals for this hospitalization and ongoing recovery are:: Home CMS Medicare.gov Compare Post Acute Care list provided to:: Patient Represenative (must comment) (mariann(spouse)) Choice offered to / list presented to : Spouse [place] ownership interest in [facility].provided to:: Spouse    Expected Discharge Plan and Services   Discharge Planning Services: CM Consult Post Acute Care Choice: Home Health Living arrangements for the past 2 months: Single Family Home                           HH Arranged: PT HH Agency: [facility] Date HH Agency Contacted: 06/05/23 Time HH Agency Contacted: 1021 Representative spoke with at [facility] Agency: [facility]  Prior Living Arrangements/Services Living arrangements for the past 2 months: Single Family Home Lives with:: Spouse   Do you feel safe going back to the place where you live?: Yes          Current home services: DME (rw)    Activities of Daily Living   ADL Screening (condition at time of admission) Independently performs ADLs?: Yes (appropriate for developmental age) Is the patient deaf or have difficulty hearing?: Yes Does the patient have difficulty seeing, even when wearing glasses/contacts?: Yes Does the patient have difficulty concentrating, remembering, or making decisions?: Yes  Permission Sought/Granted Permission sought to share information with : Case  Manager Permission granted to share information with : Yes, Verbal Permission Granted  Share Information with NAME: Case manager           Emotional Assessment              Admission diagnosis:  Sepsis due to gram-negative UTI (HCC) [A41.50, N39.0] Sepsis secondary to UTI (HCC) [A41.9, N39.0] Patient Active Problem List   Diagnosis Date Noted   Sepsis secondary to UTI (HCC) 06/03/2023   Septic shock from UTI 06/02/2023   Acute respiratory failure with hypoxia (HCC) 06/02/2023   Thrombocytopenia (HCC) 06/02/2023   LBBB (left bundle branch block) 06/02/2023   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   Urinary tract infection without hematuria 06/02/2023   E coli bacteremia 05/14/2023   Syncope 05/13/2023   Acute cystitis without hematuria 05/13/2023   Diarrhea 05/13/2023   AKI (acute kidney injury) (HCC) 05/13/2023   Hyponatremia 05/13/2023   Obesity, Class II, BMI 35-39.9 05/13/2023   Anemia of chronic disease 05/12/2023   Tremor 05/08/2023   Lack of appetite 05/08/2023   Stage 3 chronic kidney disease (HCC) 03/07/2023   Normocytic anemia 03/07/2023   Limp 12/19/2022   Gynecomastia, male 12/19/2022   Chronic back pain 12/19/2022   Urinary incontinence 11/21/2022   Hypertension 11/21/2022   Generalized arthritis 07/18/2022   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   HFrEF (heart failure with reduced ejection fraction) (HCC) 07/30/2019   PAC (premature atrial contraction) 07/17/2019   DCM (dilated cardiomyopathy) (HCC)    OSA treated with BiPAP  Primary insomnia 08/05/2017   Dementia (HCC)    Hyperlipidemia    PUD (peptic ulcer disease) 01/12/2015   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 [place] [address] Phone: [phone] Fax: [phone]     Social Drivers of Health (SDOH) Social History: SDOH Screenings   Food Insecurity: No Food Insecurity (06/02/2023)  Housing: Low Risk  (06/02/2023)  Transportation Needs: No Transportation Needs (06/02/2023)  Utilities: Not At Risk (06/02/2023)  Depression (PHQ2-9): Low Risk  (03/07/2023)  Recent Concern: Depression (PHQ2-9) - High Risk (01/15/2023)  Financial Resource Strain: Low Risk  (10/03/2022)  Physical Activity: Inactive (05/04/2022)  Social Connections: Moderately Isolated (06/03/2023)  Stress: Stress Concern Present (11/30/2022)  Tobacco Use: Low Risk  (06/02/2023)   SDOH Interventions:     Readmission Risk Interventions    06/05/2023   10:12 AM  Readmission Risk Prevention Plan  Transportation Screening Complete  PCP or Specialist Appt within 3-5 Days Complete  HRI or Home Care Consult Complete  Social Work Consult for Recovery Care Planning/Counseling Complete  Palliative Care Screening Complete  Medication Review Oceanographer) Complete

## 2023-06-05 NOTE — Consult Note (Signed)
 NAME:  Calvin Escobar, MRN:  [id number], DOB:  1950-08-08, LOS: 2 ADMISSION DATE:  06/02/2023, CONSULTATION DATE: 06/05/2023 REFERRING MD: Tobie Yetta HERO, MD, CHIEF COMPLAINT: hypotensive and resp distress  History of Present Illness:  A 73 y.o. male patient with bipolar disorder, ?dementia, HTN, dyslipidemia, BPH, possible colovesical fistula, recent complicated UTI sepsis (bacteremia, 3 weeks ago), OSA (not uses the CPAP since it causes nausea), fall, chronic tremors, and anemia of chronic illness, who admitted 3 days ago with pseudomonas UTI on Zosyn . Seen by urology and surgery and they recommended outpt eval and medical Rx for now including foley cath. Today he was developed tachycardia (SVT), tachypnea, resp distress, and restless. Given low dose Metoprolol  5 mg, Lasix  40 mg IV x1, BD, and started on BiPAP 05/08/29%. He was transfer to ICU for close monitoring. Then he developed hypotension, so given 1 L NS 0.9% bolus, Midodrine  10 mg po, Albumin , and started on Levophed  2 mcg/min. Denied asthma COPD, smoking, CP, SOB, LL edema, chills, rigors, abd pain, N/V/D, and rash. Denied aspiration, smoking, alcohol drinking, and illicit drug use. No home O2.   Pertinent  Medical History  Bipolar disorder, ?dementia, HTN, dyslipidemia, BPH, possible colovesical fistula, recent complicated UTI sepsis (bacteremia, 05/05/2023), OSA (not uses the CPAP since it causes nausea), fall, chronic tremors, anemia of chronic illness  Significant Hospital Events: Including procedures, antibiotic start and stop dates in addition to other pertinent events     Interim History / Subjective:    Objective   Blood pressure (!) 108/47, pulse 93, temperature 98.9 F (37.2 C), temperature source Axillary, resp. rate 20, height 6' 1 (1.854 m), weight 110.9 kg, SpO2 100%.    FiO2 (%):  [30 %] 30 %   Intake/Output Summary (Last 24 hours) at 06/05/2023 2308 Last data filed at 06/05/2023 2147 Gross per 24 hour  Intake 1997.13 ml   Output 200 ml  Net 1797.13 ml   Filed Weights   06/02/23 0531 06/05/23 1313  Weight: 109.3 kg 110.9 kg    Examination: General: alert, oriented x4, and comfortable. On 3 L [place]. SpO2 100%  HENT: PERL. Dry pharynx and oral mucosa. No LNE or thyromegaly. No JVD Lungs: symmetrical air entry bilaterally. No crackles or wheezing Cardiovascular: NL S1/S2. No m/g/r Abdomen: no distension or tenderness Extremities: trace edema. Symmetrical  Neuro: nonfocal    Resolved Hospital Problem list   AKI and lactic acidosis   Assessment & Plan:  Complicated pseudomonas UTI with BPH, possible colovesical fistula and moderate right and mild left hydroureteronephrosis. Sepsis and SIRS 05/13/2023, Echo EF 50%, RSVP 33 mmHg  Continue Zosyn  F/u Cx LR 500 cc bolus Midodrine  5 mg tid Wean Levophed , MAP >65 mmHg D/c BiPAP (nausea and possible aspiration) HOB elevation Speech eval H2B D/c SABA (due to SVT) Mg 2 mg x1 Uses Atrovent  PRN  I/S Already seen by urology and surgery Am labs  Bipolar disorder, ?dementia Continue home meds  HTN/dyslipidemia Hold meds for now  OSA (not uses the CPAP since it causes nausea)  Fall: negative w/u  Chronic tremors Resume home meds  HypoPO4 Replace Am labs  Acute thrombocytopenia due to sepsis, ?Zosyn   Monitor  Best Practice (right click and Reselect all SmartList Selections daily)   Diet/type: Regular consistency (see orders) DVT prophylaxis LMWH Pressure ulcer(s): N/A GI prophylaxis: H2B Lines: N/A Foley:  Yes, and it is still needed Code Status:  full code Last date of multidisciplinary goals of care discussion []   Labs   CBC:  Recent Labs  Lab 06/02/23 0524 06/02/23 0956 06/02/23 1523 06/03/23 0432 06/04/23 0925 06/05/23 0503 06/05/23 2157  WBC 10.6*  --  9.8 8.5 5.1 6.1 13.2*  NEUTROABS 8.2*  --   --   --  2.8  --   --   HGB 8.8*   < > 8.9* 8.7* 8.1* 8.4* 7.7*  HCT 28.5*   < > 29.7* 30.0* 27.4* 28.2* 25.7*  MCV 89.1  --   92.2 94.9 92.6 91.0 92.8  PLT 110*  --  111* 91* 91* 126* 139*   < > = values in this interval not displayed.    Basic Metabolic Panel: Recent Labs  Lab 06/02/23 0524 06/02/23 0956 06/03/23 0432 06/04/23 0925 06/05/23 0503  NA 137 138 133* 136 135  K 4.0 4.0 4.0 4.0 3.5  CL 103 102 103 104 101  CO2 24  --  24 28 27   GLUCOSE 153* 124* 105* 115* 106*  BUN 15 19 16 15 15   CREATININE 1.25* 1.30* 1.07 0.94 0.98  CALCIUM  8.3*  --  7.7* 8.0* 8.0*  MG  --   --   --   --  2.1  PHOS  --   --   --  3.1 2.4*   GFR: Estimated Creatinine Clearance: 86.3 mL/min (by C-G formula based on SCr of 0.98 mg/dL). Recent Labs  Lab 06/02/23 0522 06/02/23 0524 06/02/23 0524 06/02/23 0951 06/02/23 1523 06/03/23 0432 06/04/23 0925 06/05/23 0503 06/05/23 2157  PROCALCITON  --  0.21  --   --   --  0.32  --   --   --   WBC  --  10.6*   < >  --  9.8 8.5 5.1 6.1 13.2*  LATICACIDVEN 0.9  --   --  2.3* 1.6  --   --   --  1.6   < > = values in this interval not displayed.    Liver Function Tests: Recent Labs  Lab 06/02/23 0524 06/03/23 0432 06/04/23 0925 06/05/23 0503  AST 18 33  --   --   ALT 10 14  --   --   ALKPHOS 51 42  --   --   BILITOT 0.5 0.4  --   --   PROT 6.5 6.3*  --   --   ALBUMIN  2.6* 2.6* 2.4* 2.5*   No results for input(s): LIPASE, AMYLASE in the last 168 hours. No results for input(s): AMMONIA in the last 168 hours.  ABG    Component Value Date/Time   PHART 7.423 07/03/2019 1212   PCO2ART 41.7 07/03/2019 1212   PO2ART 120.0 (H) 07/03/2019 1212   HCO3 27.3 07/03/2019 1212   TCO2 27 06/02/2023 0956   O2SAT 99.0 07/03/2019 1212     Coagulation Profile: Recent Labs  Lab 06/02/23 0524  INR 1.2    Cardiac Enzymes: Recent Labs  Lab 06/02/23 0928  CKTOTAL 291    HbA1C: Hgb A1c MFr Bld  Date/Time Value Ref Range Status  11/21/2022 02:10 PM 5.4 4.6 - 6.5 % Final    Comment:    Glycemic Control Guidelines for People with Diabetes:Non Diabetic:   <6%Goal of Therapy: <7%Additional Action Suggested:  >8%     CBG: No results for input(s): GLUCAP in the last 168 hours.  Review of Systems:   Review of Systems  Constitutional:  Positive for fever and malaise/fatigue. Negative for chills, diaphoresis and weight loss.  HENT:  Negative for congestion, sinus pain and sore throat.  Respiratory:  Negative for cough, sputum production, shortness of breath, wheezing and stridor.   Cardiovascular:  Negative for chest pain, palpitations, orthopnea and PND.  Gastrointestinal:  Negative for abdominal pain, blood in stool, constipation, diarrhea, melena, nausea and vomiting.  Genitourinary:  Positive for dysuria and frequency. Negative for hematuria and urgency.  Musculoskeletal:  Negative for back pain.  Skin:  Negative for itching and rash.  Neurological:  Positive for tremors.     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   Surgical History:   Past Surgical History:  Procedure Laterality Date   COLONOSCOPY     RIGHT/LEFT HEART CATH AND CORONARY ANGIOGRAPHY N/A 07/03/2019   Procedure: RIGHT/LEFT HEART CATH AND CORONARY ANGIOGRAPHY;  Surgeon: Cherrie Toribio SAUNDERS, MD;  Location: [facility];  Service: Cardiovascular;  Laterality: N/A;     Social History:   reports that he has never smoked. He has never used smokeless tobacco. He reports current alcohol use of about 14.0 standard drinks of alcohol per week. He reports that he does not use drugs.   Family History:  His family history includes Bone cancer in his sister; Cancer in his father; Hypertension in his mother.   Allergies Allergies  Allergen Reactions   Albuterol  Palpitations     Supraventricular Tachycardia      Home Medications  Prior to Admission medications   Medication Sig Start Date End Date Taking? Authorizing Provider  ARIPiprazole  (ABILIFY ) 2 MG tablet Take 2 mg by mouth daily. 03/21/23  Yes [provider]  ascorbic acid (VITAMIN C) 500 MG tablet Take 500 mg by mouth daily.   Yes [provider]  busPIRone  (BUSPAR ) 10 MG tablet Take 10 mg by mouth 3 (three) times daily. 12/27/22  Yes [provider]  calcium -vitamin D  (OSCAL WITH D) 500-5 MG-MCG tablet Take 1 tablet by mouth daily with breakfast.   Yes [provider]  carvedilol  (COREG ) 12.5 MG tablet Take 1 tablet (12.5 mg total) by mouth 2 (two) times daily with a meal. 12/19/22  Yes [name], MD  divalproex  (DEPAKOTE  ER) 250 MG 24 hr tablet Take 500 mg by mouth at bedtime.   Yes [provider]  folic acid  (FOLVITE ) 1 MG tablet Take 1 tablet (1 mg total) by mouth daily. 03/07/23 03/06/24 Yes [name], MD  gabapentin  (NEURONTIN ) 300 MG capsule Take 300 mg by mouth at bedtime. 03/21/23  Yes [provider]  losartan  (COZAAR ) 50 MG tablet Take 1 tablet (50 mg total) by mouth daily. Replaces entresto , start after done with entresto  12/19/22  Yes [name], MD  mirtazapine  (REMERON ) 30 MG tablet Take 30 mg by mouth at bedtime. 03/21/23  Yes [provider]  Multiple Vitamin (MULTIVITAMIN) tablet Take 1 tablet by mouth daily.   Yes [provider]  pravastatin  (PRAVACHOL ) 20 MG tablet Take 1 tablet (20 mg total) by mouth at bedtime. 12/19/22  Yes [name], MD  primidone  (MYSOLINE ) 50 MG tablet Take 1 tablet (50 mg total) by mouth 4 (four) times daily as needed. For tremor, intention tremor. Patient taking differently: Take 50 mg by mouth 3 (three) times daily. 05/08/23  Yes [name], MD  tamsulosin  (FLOMAX ) 0.4 MG CAPS capsule TAKE 1 CAPSULE BY MOUTH  EVERY DAY 11/12/22  Yes [name], MD  traZODone   (DESYREL ) 100 MG tablet Take 300 mg by mouth at bedtime. 02/06/20  Yes [provider]  venlafaxine  XR (EFFEXOR -XR) 150 MG 24 hr capsule Take 300 mg by mouth daily with breakfast.   Yes [provider]     Critical care time: 60 min

## 2023-06-05 NOTE — Progress Notes (Signed)
 Pt anxious to get up and go to BR. Assisted by staff. Pt very SOB and wheezing when walking back to chair. Pt visibly in distress and HR sustaining 130s, up as high as 170. MD, RR and CN notified.

## 2023-06-05 NOTE — Progress Notes (Addendum)
     Patient Name: Celvin Taney           DOB: 1950/11/22  MRN: [id number]      Admission Date: 06/02/2023  Attending Provider: Tobie Yetta HERO, MD  Primary Diagnosis: Septic shock [facility])   Level of care: Stepdown    CROSS COVER NOTE   Date of Service   06/05/2023   Norleen Brier, 73 y.o. 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  Initially treated with fluids and IV antibiotics.  Currently on Zosyn  per surgery for complex diverticulitis protocol.    06/05/2023-   Rapid response-respiratory distress, febrile, complex tachycardia.  Patient received DuoNeb, Ativan , morphine , Lasix , BiPAP for respiratory distress, and received Lopressor  for HR 160-170 bpm.  Transferred to SDU.  Became hypotensive.    HPI/Events of Note   Hypotension, SBP 70-80 with MAP mid 50's Patient has been hypotensive since earlier today (3-4 pm).  Change in blood pressure occurred after being treated for acute respiratory distress and tachycardia. Hypotension was treated with 1 L fluid bolus and IV albumin .  MD opted to avoid further fluid hydration given change in respiratory status and history CHF. Patient was febrile earlier with max temp of 102.8 F.  Last WBC 6.1.  Continue Zosyn .  Will obtain lactic acid, CBC.  Chest x-ray earlier showing low lung volume, lungs sound clear.  RN staff has attempted to remove BiPAP, but patient was having increased WOB.   No large volume loss reported.  No signs of bleeding.  Hemoglobin stable. Patient did received morphine , Ativan , Lopressor , Lasix  which likely contributed to hypotensive episode. Hold sedating agents and IV antihypertensive medications.  Bedside Assessment:  Patient is awake, A/O to self, appears restless with BiPAP.   Patient is unable to focus much during our discussion, but does deny dizziness, chest pain, palpitations, shortness of  breath, nausea, or abdominal discomfort.   Respiratory: Bilaterally clear, diminished bases. Normal effort. No accessory muscle use.  Cardiovascular: Regular rate and rhythm. No extremity edema. 2+ pedal pulses. Abdomen: Abdomen is soft and nontender.  Positive bowel sounds in all quadrants.   Plan-will trial another round of IV albumin  with oral midodrine  if patient is able to tolerate taking BiPAP off.  If no improvement with this, should consider pressors with critical care consult for BP management.  Addendum: Per nursing staff, patient not tolerating BiPAP removal.  SBP 70s.   Will start peripheral Levophed  and consult critical care.   Interventions/ Plan   IV albumin  Midodrine , 10 mg- not tolerating taking BiPAP off per nursing staff Peripheral Levophed , critical care consulted-completed at 2140 with Dr. Dub.  Labs: CBC, Lactic acid, BNP        Lavanda Horns, DNP, ACNPC- AG [facility] [place]

## 2023-06-06 DIAGNOSIS — A419 Sepsis, unspecified organism: Secondary | ICD-10-CM | POA: Diagnosis not present

## 2023-06-06 DIAGNOSIS — R6521 Severe sepsis with septic shock: Secondary | ICD-10-CM | POA: Diagnosis not present

## 2023-06-06 LAB — CBC
HCT: 23.3 % — ABNORMAL LOW (ref 39.0–52.0)
Hemoglobin: 7 g/dL — ABNORMAL LOW (ref 13.0–17.0)
MCH: 28.1 pg (ref 26.0–34.0)
MCHC: 30 g/dL (ref 30.0–36.0)
MCV: 93.6 fL (ref 80.0–100.0)
Platelets: 121 10*3/uL — ABNORMAL LOW (ref 150–400)
RBC: 2.49 MIL/uL — ABNORMAL LOW (ref 4.22–5.81)
RDW: 17.2 % — ABNORMAL HIGH (ref 11.5–15.5)
WBC: 9.9 10*3/uL (ref 4.0–10.5)
nRBC: 0 % (ref 0.0–0.2)

## 2023-06-06 LAB — RENAL FUNCTION PANEL
Albumin: 2.6 g/dL — ABNORMAL LOW (ref 3.5–5.0)
Anion gap: 8 (ref 5–15)
BUN: 17 mg/dL (ref 8–23)
CO2: 22 mmol/L (ref 22–32)
Calcium: 7.7 mg/dL — ABNORMAL LOW (ref 8.9–10.3)
Chloride: 105 mmol/L (ref 98–111)
Creatinine, Ser: 1.33 mg/dL — ABNORMAL HIGH (ref 0.61–1.24)
GFR, Estimated: 56 mL/min — ABNORMAL LOW (ref >60)
Glucose, Bld: 102 mg/dL — ABNORMAL HIGH (ref 70–99)
Phosphorus: 3.3 mg/dL (ref 2.5–4.6)
Potassium: 4 mmol/L (ref 3.5–5.1)
Sodium: 135 mmol/L (ref 135–145)

## 2023-06-06 LAB — HEMOGLOBIN AND HEMATOCRIT, BLOOD
HCT: 25.5 % — ABNORMAL LOW (ref 39.0–52.0)
Hemoglobin: 8 g/dL — ABNORMAL LOW (ref 13.0–17.0)

## 2023-06-06 LAB — PREPARE RBC (CROSSMATCH)

## 2023-06-06 LAB — PHOSPHORUS: Phosphorus: 3.6 mg/dL (ref 2.5–4.6)

## 2023-06-06 LAB — MAGNESIUM: Magnesium: 2.4 mg/dL (ref 1.7–2.4)

## 2023-06-06 MED ORDER — POTASSIUM CHLORIDE 10 MEQ/50ML IV SOLN
INTRAVENOUS | Status: AC
  Filled 2023-06-06: qty 50

## 2023-06-06 MED ORDER — SODIUM CHLORIDE 0.9% IV SOLUTION
Freq: Once | INTRAVENOUS | Status: AC
Start: 2023-06-06 — End: 2023-06-06

## 2023-06-06 MED ORDER — NYSTATIN 100000 UNIT/ML MT SUSP
5.0000 mL | Freq: Four times a day (QID) | OROMUCOSAL | Status: DC
  Administered 2023-06-06 – 2023-06-11 (×18): 500000 [IU] via ORAL
  Filled 2023-06-06 (×17): qty 5

## 2023-06-06 NOTE — Plan of Care (Signed)

## 2023-06-06 NOTE — Progress Notes (Signed)
 Clinical/Bedside Swallow Evaluation Patient Details  Name: Maksymilian Mabey MRN: [id number] Date of Birth: 04/15/1951  Today's Date: 06/06/2023 Time: SLP Start Time (ACUTE ONLY): 1510 SLP Stop Time (ACUTE ONLY): 1538 SLP Time Calculation (min) (ACUTE ONLY): 28 min  Past Medical History:  Past Medical History:  Diagnosis Date   Allergy seasonal   Ascending aorta dilatation (HCC)    38 mm by 2D echo 04/2021   Cancer (HCC) skin   DCM (dilated cardiomyopathy) (HCC)    nonischemic with normal coronary arteries at cath 06/2019.  EF 35 to 40% on echo 04/2021   Dementia (HCC)    Depression    Hyperlipidemia    Lithium  toxicity 01/29/2016   OSA treated with BiPAP    SKIN CANCER, HX OF 05/10/2007   Facial left check and forehead and r shoulder  F/w derm   Urinary dribbling 03/14/2022   Past Surgical History:  Past Surgical History:  Procedure Laterality Date   COLONOSCOPY     RIGHT/LEFT HEART CATH AND CORONARY ANGIOGRAPHY N/A 07/03/2019   Procedure: RIGHT/LEFT HEART CATH AND CORONARY ANGIOGRAPHY;  Surgeon: Cherrie Toribio SAUNDERS, MD;  Location: [facility];  Service: Cardiovascular;  Laterality: N/A;   HPI:  73 y.o. male admitteed with continued falls and weakness since last admission. recent adm 12/9-12/12 for severe sepsis secondary to E. coli bacteremia. 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 urology consulted, general surgery consulted. PMH: HFrEF (recovered), HTN, HLD, BPH, dementia, bipolar disorder, OSA on bipap, and tremors, BPH, C4-C7 . Transfer to SDU with AMS and SOB, increased HR 06/05/23.  Swallow eval ordered.  CXR negative.  Pt with stool retention per images.  Surgery saw pt and approved his diet advancing to dys3/thin.  Pt has had prior UGI study in 04/2021 that showed  Mild esophageal dysmotility.  2. Tiny hiatal herni and a fair amount of residual contrast in the  oropharynx.  From review of flouroscopy loops from UGI study, pt with appearance of secretions retained in pharynx that mixed with barium.    Assessment / Plan / Recommendation  Clinical Impression  Patient with no focal CN deficits - he does have h/o esophageal dysmotility and a small hiatal hernia and some oropharyngeal retention per prior UGI study in 04/2021.  Today pt able to feed himself - and benefits from cues to take small bites and eat slowly. He may benefit from using applesauce to transit masticated solids into pharynx.  He is observed to take deep open-mouth inhalation with manipulation of solids and purees in his mouth- prior to swallow initiation.  But no clinical indication of aspiraiton.  3 ounce Yale water  challenge conducted at end of testing - resulting in pt overtly coughing - copiously and having mildly increased work of breathing. Cough was not productive and episode was uncomfortable for the pt. He does endorse similar episodes at home but denies it being due to aspiration.  Suspect his primary risk is his known dysmotility exacerbated by his dyspnea.  Recommend continue dys2/thin for now - and SlP will follow up to assure tolerance and for mitigation strategies.  Using teach back and providing written precautions, pt educated to plan. SLP Visit Diagnosis: Dysphagia, unspecified (R13.10)    Aspiration Risk  Mild aspiration risk    Diet Recommendation Dysphagia 2 (Fine chop);Thin liquid    Liquid Administration via: Cup;Straw Medication Administration: Other (Comment) (one at a time)  Supervision: Patient able to self feed;Intermittent supervision to cue for compensatory strategies Compensations: Minimize environmental distractions;Slow rate;Small sips/bites Postural Changes: Seated upright at 90 degrees;Remain upright for at least 30 minutes after po intake    Other  Recommendations Oral Care Recommendations: Oral care BID    Recommendations for follow up therapy are one  component of a multi-disciplinary discharge planning process, led by the attending physician.  Recommendations may be updated based on patient status, additional functional criteria and insurance authorization.  Follow up Recommendations Follow physician's recommendations for discharge plan and follow up therapies      Assistance Recommended at Discharge  TBD  Functional Status Assessment Patient has had a recent decline in their functional status and demonstrates the ability to make significant improvements in function in a reasonable and predictable amount of time.  Frequency and Duration min 1 x/week  1 week       Prognosis Prognosis for improved oropharyngeal function: Good Barriers to Reach Goals: Time post onset      Swallow Study   General Date of Onset: 06/06/23 HPI: 73 y.o. male admitteed with continued falls and weakness since last admission. recent adm 12/9-12/12 for severe sepsis secondary to E. coli bacteremia. 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 urology consulted, general surgery consulted. PMH: HFrEF (recovered), HTN, HLD, BPH, dementia, bipolar disorder, OSA on bipap, and tremors, BPH, C4-C7 . Transfer to SDU with AMS and SOB, increased HR 06/05/23.  Swallow eval ordered.  CXR negative.  Pt with stool retention per images.  Surgery saw pt and approved his diet advancing to dys3/thin.  Pt has had prior UGI study in 04/2021 that showed  Mild esophageal dysmotility.  2. Tiny hiatal herni and a fair amount of residual contrast in the oropharynx.  From review of flouroscopy loops from UGI study, pt with appearance of secretions retained in pharynx that mixed with barium. Type of Study: Bedside Swallow Evaluation Previous Swallow Assessment: see HPI. Diet Prior to this Study: Dysphagia 2 (finely chopped);Thin liquids (Level 0) Temperature Spikes Noted:  No Respiratory Status: Room air History of Recent Intubation: No Behavior/Cognition: Alert;Cooperative;Pleasant mood Oral Cavity Assessment: Within Functional Limits Oral Care Completed by SLP: No Oral Cavity - Dentition: Adequate natural dentition, few lower, no upper teeth Vision: Functional for self-feeding Self-Feeding Abilities: Able to feed self Patient Positioning: Upright in bed Baseline Vocal Quality: Normal Volitional Cough: Strong Volitional Swallow: Able to elicit    Oral/Motor/Sensory Function Overall Oral Motor/Sensory Function: Generalized oral weakness   Ice Chips Ice chips: Not tested   Thin Liquid Thin Liquid: Impaired Presentation: Straw Pharyngeal  Phase Impairments: Cough - Immediate (with 3 ounce Yale swallow screen)    Nectar Thick Nectar Thick Liquid: Not tested   Honey Thick Honey Thick Liquid: Not tested   Puree Puree: Within functional limits Presentation: Self Fed;Spoon   Solid     Solid: Impaired Presentation: Self Fed Oral Phase Impairments: Reduced lingual movement/coordination;Impaired mastication Oral Phase Functional Implications: Impaired mastication;Prolonged oral transit;Oral residue      [name] 06/06/2023,4:14 PM   [name], MS [facility] SLP Acute Rehab Services Office [phone]

## 2023-06-06 NOTE — Progress Notes (Signed)
   NAME:  Calvin Escobar, MRN:  [id number], DOB:  09-01-1950, LOS: 3 ADMISSION DATE:  06/02/2023, CONSULTATION DATE:  06/05/23 REFERRING MD:  [name], CHIEF COMPLAINT: Hypotension and respiratory distress  History of Present Illness:  A 73 y.o. male patient with bipolar disorder, ?dementia, HTN, dyslipidemia, BPH, possible colovesical fistula, recent complicated UTI sepsis (bacteremia, 3 weeks ago), OSA (not uses the CPAP since it causes nausea), fall, chronic tremors, and anemia of chronic illness, who admitted 3 days ago with pseudomonas UTI on Zosyn . Seen by urology and surgery and they recommended outpt eval and medical Rx for now including foley cath. Today he was developed tachycardia (SVT), tachypnea, resp distress, and restless. Given low dose Metoprolol  5 mg, Lasix  40 mg IV x1, BD, and started on BiPAP 05/08/29%. He was transfer to ICU for close monitoring. Then he developed hypotension, so given 1 L NS 0.9% bolus, Midodrine  10 mg po, Albumin , and started on Levophed  2 mcg/min. Denied asthma COPD, smoking, CP, SOB, LL edema, chills, rigors, abd pain, N/V/D, and rash. Denied aspiration, smoking, alcohol drinking, and illicit drug use. No home O2.   Pertinent  Medical History  Bipolar disorder, ?dementia, HTN, dyslipidemia, BPH, possible colovesical fistula, recent complicated UTI sepsis (bacteremia, 05/05/2023), OSA (not uses the CPAP since it causes nausea), fall, chronic tremors, anemia of chronic illness    Significant Hospital Events: Including procedures, antibiotic start and stop dates in addition to other pertinent events   06/05/2023-started on pressors  Interim History / Subjective:  Awake alert interactive Does not appear to be in distress  Objective   Blood pressure (!) 173/57, pulse 90, temperature 97.8 F (36.6 C), temperature source Axillary, resp. rate (!) 24, height 6' 1 (1.854 m), weight 110.9 kg, SpO2 98%.    FiO2 (%):  [30 %] 30 %   Intake/Output Summary (Last 24  hours) at 06/06/2023 0842 Last data filed at 06/06/2023 0700 Gross per 24 hour  Intake 3327.87 ml  Output --  Net 3327.87 ml   Filed Weights   06/02/23 0531 06/05/23 1313  Weight: 109.3 kg 110.9 kg    Examination: General: Alert and oriented, does appear comfortable HENT: Moist oral mucosa Lungs: Clear breath sounds Cardiovascular: S1-S2 appreciated Abdomen: Soft, bowel sounds appreciated Extremities: No clubbing, trace edema Neuro: Nonfocal GU:   I reviewed nursing notes, Consultant notes, hospitalist notes, last 24 h vitals and pain scores, last 48 h intake and output, last 24 h labs and trends, and last 24 h imaging results.  Resolved Hospital Problem list   Was able to wean off pressors AKI and lactic acidosis improved  Assessment & Plan:  Pseudomonas UTI with BPH Possible colovesical fistula moderate right, mild left hydro ureteronephrosis Sepsis -Remains on Zosyn  -Was weaned off Levophed  -On midodrine  -Urology and surgery continue to follow  Bipolar disorder Dementia -To continue home meds  History of obstructive sleep apnea -Not using CPAP  Electrolyte derangements being repleted  Thrombocytopenia likely related to sepsis  Discussed with Dr. Tobie at bedside  Will sign off, call us  as needed  Best Practice (right click and Reselect all SmartList Selections daily)   Diet/type: Regular consistency (see orders) DVT prophylaxis LMWH Pressure ulcer(s): N/A GI prophylaxis: H2B Lines: N/A Foley:  Yes, and it is still needed Code Status:  full code Last date of multidisciplinary goals of care discussion [Per primary]  Jennet Epley, MD [facility] Pager: See Tracey

## 2023-06-06 NOTE — Progress Notes (Signed)
 [facility] Progress Note Patient: Calvin Escobar FMW:[id number] DOB: April 17, 1951 DOA: 06/02/2023  DOS: the patient was seen and examined on 06/06/2023  Brief Hospital Course: PMH of HTN, HLD, BPH, HFrEF, bipolar disorder, OSA on CPAP, tremor presented to hospital with complaints of confusion and fall. 12/8 - 12/12 hospitalized for confusion and was treated for severe sepsis secondary to E. coli bacteremia from UTI-emphysematous cystitis. Has not been eating or drinking prior to admission and had worsening tremors and then had a fall and unable to get up all by himself and therefore was brought to the hospital. Currently being treated for severe sepsis from Pseudomonas UTI.  Assessment and Plan: Septic shock from Pseudomonas UTI. BPH Met SIRS criteria with fever, tachypnea.  Evidence of infection in the urine.  Organ damage with AKI, lactic acidosis as well as hypotension. Treated with IV fluid.  Responded well.  No evidence of shock. Given Pseudomonas, continue with IV antibiotic for now. Urology consulted. Concern for colovesical fistula. Recommendation is for conservative management. Foley catheter placed for decompression.  Will require on discharge. Developed shock on 1/1 requiring IV pressors. Blood pressure improving again.  Colovesical fistula. CT abdomen pelvis with and without contrast shows evidence of large amount of gas and debris's in the urinary bladder along with moderate right-sided hydronephrosis.  As well as concern for colovesical fistula. General surgery was consulted as well as urology. Both recommending conservative measures with treatment of sepsis for now. Recently had a cystoscopy which did not show any evidence of colovesical fistula. Surgery recommending colonoscopy, cardiology clearance as well as medical clearance for him to undergo any surgical intervention that would require fixation of his colovesical fistula. Reportedly patient has evidence of leakage  from his Foley catheter for urine as well as leakage of urine along with BM secondary to fistula.  Monitor.  Acute respiratory failure with hypoxia. Possible aspiration event in sleep. Shock On 1/1 patient woke up from his nap with sudden onset of shortness of breath with upper airway wheezing.  His heart rate went up to 160s and respiratory rate was in 30s.  Patient received DuoNebs which initially improved his wheezing and shortness of breath but symptoms reoccurred.  His chronic tremors worsen.  Started becoming more agitated due to respiratory distress. Received IV Ativan  without any benefit. Heart rate was in 150s sustained sinus tachycardia/SVT on telemetry.  Due to respiratory distress decision was made not to use adenosine for SVT. Patient was given IV Lopressor  with minimal improvement in heart rate. Due to work of breathing patient was placed on BiPAP. Due to ongoing agitation and respiratory distress patient received IV morphine . After being transferred to stepdown unit for close observation, heart rate remains elevated.  Blood pressure was stable as patient received another dose of IV Lopressor .  Patient remained agitated and therefore received another dose of IV Ativan . Agitation and heart rate improved after this but blood pressure also significantly dropped. Received 1 L IV fluid bolus followed by IV albumin . Developed fever and therefore received Toradol . Currently blood pressure appears to be improving.  Mentation improving.  Heart rate improving therefore patient will be taken off of BiPAP. Continue BiPAP as needed.  CPAP nightly. Treated with IV pressors.  Now off of the pressors and on midodrine .  Recurrent fall. Most likely syncope in the setting of sepsis and hypotension. Similar presentation recently which improved with hydration and treatment of sepsis. Will monitor for now.  Chronic HFrEF. HTN Now with hypotension Appears short of breath.  Does not appear volume  overloaded. On Coreg  at home 12.5.  Holding losartan  50 mg. Not a candidate for SGLT2 inhibitor due to UTI. Developed hypotension on 1/1.  Received 1 L fluid bolus as well as IV albumin .  Required Levophed .  Now on midodrine .  OSA. On CPAP.  Will continue.  Chronic tremors. On primidone .  History of dementia. Delirium secondary to acute metabolic encephalopathy. Initially improved but later on reoccurred after event above.  No focal deficit.  Anticipate improvement in delirium once hemodynamically stable.  Bipolar disorder. On home regimen including Depakote , BuSpar , trazodone , Remeron  and Abilify . For now we will continue on monitor.  HLD. On Pravachol .  Continue.  Concern for CKD-ruled out. GFR appears to be more than 60 right now. Serum creatinine 1.07. Most likely presentation actually associated with mild renal insufficiency with serum creatinine 1.25.   Obesity Class 1 Body mass index is 31.8 kg/m.  Placing the pt at higher risk of poor outcomes.   Dysphagia Speech therapy consulted. Currently recommending dysphagia 2 diet. Anticipate improvement as mentation improves.  Subjective: Mentation improving.  No nausea no vomiting no fever no chills.  No acute complaint.  Physical Exam: General: in Mild distress, No Rash Cardiovascular: S1 and S2 Present, No Murmur Respiratory: Good respiratory effort, Bilateral Air entry present. No Crackles, No wheezes Abdomen: Bowel Sound present, No tenderness Extremities: Trace edema Neuro: Alert and oriented self, no new focal deficit  Data Reviewed: I have Reviewed nursing notes, Vitals, and Lab results. Since last encounter, pertinent lab results CBC and BMP   . I have ordered test including CBC and BMP  .  Discussed with PCCM.  Disposition: Status is: Inpatient Remains inpatient appropriate because: Monitor for improvement in mentation and respiratory status and blood pressure  Place and maintain sequential  compression device Start: 06/02/23 1250   Family Communication: No one at bedside.  Unable to reach wife.  Left voicemail. Level of care: Stepdown continue stepdown. Vitals:   06/06/23 1200 06/06/23 1604 06/06/23 1624 06/06/23 1908  BP: 139/79 127/76 123/77 135/79  Pulse:  83 88 83  Resp: (!) 27 (!) 30 (!) 23 (!) 23  Temp:  98.3 F (36.8 C) 98.4 F (36.9 C) 98.8 F (37.1 C)  TempSrc:   Axillary Oral  SpO2:  95% 96% 98%  Weight:      Height:         Author: Yetta Blanch, MD 06/06/2023 8:11 PM  Please look on [email or web address] to find out who is on call.

## 2023-06-06 NOTE — Progress Notes (Signed)
 jPhysical Therapy Treatment Patient Details Name: Calvin Escobar MRN: [id number] DOB: 27-Aug-1950 Today's Date: 06/06/2023   History of Present Illness 73 y.o. male admitteed with continued falls and weakness since last admission. recent adm 12/9-12/12 for severe sepsis secondary to E. coli bacteremia. 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 urology consulted, general surgery consulted. PMH: HFrEF (recovered), HTN, HLD, BPH, dementia, bipolar disorder, OSA on bipap, and tremors, BPH. Transfer to SDU with AMS and SOB, increased HR 06/05/23    PT Comments  The  patient is awake,  able to follow directions with increased time.  Patient moved to SDU. Patient did  sit and stand and took a few steps, ambulated x 500' on 12/31 with RW. PT will continue in hopes that patient will  return to ambulatory status to DC home with wife support as previously recommended. SPO2 on 2 L  >97%   If plan is discharge home, recommend the following: A little help with walking and/or transfers;A little help with bathing/dressing/bathroom;Assistance with cooking/housework;Direct supervision/assist for medications management;Assist for transportation;Help with stairs or ramp for entrance   Can travel by private vehicle        Equipment Recommendations  None recommended by PT    Recommendations for Other Services       Precautions / Restrictions Precautions Precautions: Fall Precaution Comments: monitor VS     Mobility  Bed Mobility   Bed Mobility: Supine to Sit, Sit to Supine           General bed mobility comments: incr time needed, CGA to elevate trunk safely, cues for safety and lines    Transfers Overall transfer level: Needs assistance Equipment used: Rolling walker (2 wheels) Transfers: Sit to/from Stand Sit to Stand: Min assist           General transfer comment: Cues for  holding Rw,  cues for safety, side steps along bed x 5.    Ambulation/Gait                   Stairs             Wheelchair Mobility     Tilt Bed    Modified Rankin (Stroke Patients Only)       Balance Overall balance assessment: Independent Sitting-balance support: Feet supported, Bilateral upper extremity supported Sitting balance-Leahy Scale: Fair     Standing balance support: During functional activity, Reliant on assistive device for balance Standing balance-Leahy Scale: Fair Standing balance comment: with RW support                            Cognition Arousal: Alert Behavior During Therapy: WFL for tasks assessed/performed, Impulsive Overall Cognitive Status: History of cognitive impairments - at baseline                                 General Comments: increased cues for safety and awareness        Exercises      General Comments        Pertinent Vitals/Pain Pain Assessment Pain Assessment: Faces Pain Location: foley cath was caught on foot and pulling Pain Descriptors / Indicators: Discomfort, Grimacing Pain Intervention(s): Monitored during session, Repositioned    Home Living  Prior Function            PT Goals (current goals can now be found in the care plan section) Progress towards PT goals: Progressing toward goals    Frequency    Min 1X/week      PT Plan      Co-evaluation              AM-PAC PT 6 Clicks Mobility   Outcome Measure  Help needed turning from your back to your side while in a flat bed without using bedrails?: A Little Help needed moving from lying on your back to sitting on the side of a flat bed without using bedrails?: A Little Help needed moving to and from a bed to a chair (including a wheelchair)?: A Little Help needed standing up from a chair using your arms (e.g., wheelchair or bedside chair)?: A Little Help needed to  walk in hospital room?: A Lot Help needed climbing 3-5 steps with a railing? : Total 6 Click Score: 15    End of Session   Activity Tolerance: Patient tolerated treatment well Patient left: in bed;with call bell/phone within reach;with bed alarm set Nurse Communication: Mobility status PT Visit Diagnosis: Other abnormalities of gait and mobility (R26.89)     Time: 9049-8984 PT Time Calculation (min) (ACUTE ONLY): 25 min  Charges:    $Therapeutic Activity: 23-37 mins PT General Charges $$ ACUTE PT VISIT: 1 Visit                     Darice Potters PT Acute Rehabilitation Services Office [phone] Weekend [phone]    Potters Darice Norris 06/06/2023, 12:34 PM

## 2023-06-06 NOTE — Progress Notes (Signed)
 Subjective/Chief Complaint: Transferred to ICU for hypotension, tachycardia and fever Patient having bowel movements WBC is normal No new complaints  Objective: Vital signs in last 24 hours: Temp:  [97.8 F (36.6 C)-102.8 F (39.3 C)] 97.8 F (36.6 C) (01/02 0803) Pulse Rate:  [70-142] 90 (01/02 0700) Resp:  [10-42] 24 (01/02 0700) BP: (65-173)/(17-96) 173/57 (01/02 0700) SpO2:  [89 %-100 %] 98 % (01/02 0700) FiO2 (%):  [30 %] 30 % (01/01 1950) Weight:  [110.9 kg] 110.9 kg (01/01 1313) Last BM Date : 06/06/23  Intake/Output from previous day: 01/01 0701 - 01/02 0700 In: 3327.9 [P.O.:240; I.V.:715.3; IV Piggyback:2372.5] Out: -  Intake/Output this shift: No intake/output data recorded.  Elderly male - NAD Mildly confused Abd - soft, non-tender  Lab Results:  Recent Labs    06/05/23 2157 06/06/23 0310  WBC 13.2* 9.9  HGB 7.7* 7.0*  HCT 25.7* 23.3*  PLT 139* 121*   BMET Recent Labs    06/05/23 0503 06/06/23 0310  NA 135 135  K 3.5 4.0  CL 101 105  CO2 27 22  GLUCOSE 106* 102*  BUN 15 17  CREATININE 0.98 1.33*  CALCIUM  8.0* 7.7*   Anti-infectives: Anti-infectives (From admission, onward)    Start     Dose/Rate Route Frequency Ordered Stop   06/05/23 1400  piperacillin -tazobactam (ZOSYN ) IVPB 3.375 g        3.375 g 12.5 mL/hr over 240 Minutes Intravenous Every 8 hours 06/05/23 0957 06/10/23 1359   06/05/23 1000  amoxicillin -clavulanate (AUGMENTIN ) 875-125 MG per tablet 1 tablet  Status:  Discontinued        1 tablet Oral Every 12 hours 06/05/23 0757 06/05/23 0956   06/03/23 0945  cefTRIAXone  (ROCEPHIN ) 2 g in sodium chloride  0.9 % 100 mL IVPB  Status:  Discontinued        2 g 200 mL/hr over 30 Minutes Intravenous Every 24 hours 06/02/23 1022 06/02/23 1405   06/02/23 1600  piperacillin -tazobactam (ZOSYN ) IVPB 3.375 g  Status:  Discontinued        3.375 g 12.5 mL/hr over 240 Minutes Intravenous Every 8 hours 06/02/23 1405 06/05/23 0757   06/02/23  1130  metroNIDAZOLE  (FLAGYL ) IVPB 500 mg  Status:  Discontinued        500 mg 100 mL/hr over 60 Minutes Intravenous Every 12 hours 06/02/23 1128 06/02/23 1405   06/02/23 0945  cefTRIAXone  (ROCEPHIN ) 1 g in sodium chloride  0.9 % 100 mL IVPB  Status:  Discontinued        1 g 200 mL/hr over 30 Minutes Intravenous Every 24 hours 06/02/23 0931 06/02/23 1022   06/02/23 0545  aztreonam  (AZACTAM ) 1 g in sodium chloride  0.9 % 100 mL IVPB  Status:  Discontinued        1 g 200 mL/hr over 30 Minutes Intravenous  Once 06/02/23 0534 06/02/23 0534   06/02/23 0545  aztreonam  (AZACTAM ) 2 g in sodium chloride  0.9 % 100 mL IVPB        2 g 200 mL/hr over 30 Minutes Intravenous  Once 06/02/23 0535 06/02/23 9352       Assessment/Plan: 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  Normal WBC and abd soft  Plan:  He does not appear to have an abd source of infection on exam.  May need chronic foley to drain bladder and control infection but will defer to Urology  Treat conservatively with  antibiotics and source control.  Will hopefully be able to transition to outpatient work-up with colonoscopy, cardiac clearance.  Eventual elective colon resection with takedown of colovesical fistula.   Currently, no indications for urgent surgical intervention.   FTE - Advance to soft diet   Principal Problem:   Septic shock from UTI Active Problems:   Bipolar affective disorder, depressed (HCC)   Dementia (HCC)   Hyperlipidemia   OSA treated with BiPAP   HFrEF (heart failure with reduced ejection fraction) (HCC)   Benign prostatic hyperplasia with urinary obstruction   Stage 3 chronic kidney disease (HCC)   Normocytic anemia   Tremor   Acute respiratory failure with hypoxia (HCC)   Thrombocytopenia (HCC)   LBBB (left bundle branch block)  LOS: 3 days    Calvin Escobar 06/06/2023

## 2023-06-07 ENCOUNTER — Inpatient Hospital Stay (HOSPITAL_COMMUNITY): Payer: Medicare HMO

## 2023-06-07 DIAGNOSIS — F0394 Unspecified dementia, unspecified severity, with anxiety: Secondary | ICD-10-CM | POA: Diagnosis not present

## 2023-06-07 DIAGNOSIS — F3131 Bipolar disorder, current episode depressed, mild: Secondary | ICD-10-CM | POA: Diagnosis not present

## 2023-06-07 DIAGNOSIS — N321 Vesicointestinal fistula: Secondary | ICD-10-CM | POA: Diagnosis not present

## 2023-06-07 DIAGNOSIS — N390 Urinary tract infection, site not specified: Secondary | ICD-10-CM

## 2023-06-07 DIAGNOSIS — A419 Sepsis, unspecified organism: Secondary | ICD-10-CM | POA: Diagnosis not present

## 2023-06-07 LAB — CULTURE, BLOOD (ROUTINE X 2)
Culture: NO GROWTH
Culture: NO GROWTH
Special Requests: ADEQUATE
Special Requests: ADEQUATE

## 2023-06-07 LAB — CBC WITH DIFFERENTIAL/PLATELET
Abs Immature Granulocytes: 0.1 10*3/uL — ABNORMAL HIGH (ref 0.00–0.07)
Basophils Absolute: 0.1 10*3/uL (ref 0.0–0.1)
Basophils Relative: 2 %
Eosinophils Absolute: 0.4 10*3/uL (ref 0.0–0.5)
Eosinophils Relative: 5 %
HCT: 27 % — ABNORMAL LOW (ref 39.0–52.0)
Hemoglobin: 8.3 g/dL — ABNORMAL LOW (ref 13.0–17.0)
Immature Granulocytes: 1 %
Lymphocytes Relative: 18 %
Lymphs Abs: 1.4 10*3/uL (ref 0.7–4.0)
MCH: 27.6 pg (ref 26.0–34.0)
MCHC: 30.7 g/dL (ref 30.0–36.0)
MCV: 89.7 fL (ref 80.0–100.0)
Monocytes Absolute: 1.1 10*3/uL — ABNORMAL HIGH (ref 0.1–1.0)
Monocytes Relative: 14 %
Neutro Abs: 4.8 10*3/uL (ref 1.7–7.7)
Neutrophils Relative %: 60 %
Platelets: 164 10*3/uL (ref 150–400)
RBC: 3.01 MIL/uL — ABNORMAL LOW (ref 4.22–5.81)
RDW: 17.4 % — ABNORMAL HIGH (ref 11.5–15.5)
WBC: 7.9 10*3/uL (ref 4.0–10.5)
nRBC: 0 % (ref 0.0–0.2)

## 2023-06-07 LAB — BPAM RBC
Blood Product Expiration Date: 202501282359
ISSUE DATE / TIME: 202501021550
Unit Type and Rh: 5100

## 2023-06-07 LAB — MIC RESULTS (3 DRUGS)

## 2023-06-07 LAB — COMPREHENSIVE METABOLIC PANEL
ALT: 23 U/L (ref 0–44)
AST: 33 U/L (ref 15–41)
Albumin: 2.8 g/dL — ABNORMAL LOW (ref 3.5–5.0)
Alkaline Phosphatase: 49 U/L (ref 38–126)
Anion gap: 8 (ref 5–15)
BUN: 18 mg/dL (ref 8–23)
CO2: 26 mmol/L (ref 22–32)
Calcium: 8.2 mg/dL — ABNORMAL LOW (ref 8.9–10.3)
Chloride: 106 mmol/L (ref 98–111)
Creatinine, Ser: 1.11 mg/dL (ref 0.61–1.24)
GFR, Estimated: >60 mL/min (ref >60)
Glucose, Bld: 97 mg/dL (ref 70–99)
Potassium: 4.2 mmol/L (ref 3.5–5.1)
Sodium: 140 mmol/L (ref 135–145)
Total Bilirubin: 0.5 mg/dL (ref 0.0–1.2)
Total Protein: 6.5 g/dL (ref 6.5–8.1)

## 2023-06-07 LAB — RENAL FUNCTION PANEL
Albumin: 2.7 g/dL — ABNORMAL LOW (ref 3.5–5.0)
Anion gap: 8 (ref 5–15)
BUN: 19 mg/dL (ref 8–23)
CO2: 25 mmol/L (ref 22–32)
Calcium: 8.1 mg/dL — ABNORMAL LOW (ref 8.9–10.3)
Chloride: 105 mmol/L (ref 98–111)
Creatinine, Ser: 0.96 mg/dL (ref 0.61–1.24)
GFR, Estimated: >60 mL/min (ref >60)
Glucose, Bld: 93 mg/dL (ref 70–99)
Phosphorus: 3.1 mg/dL (ref 2.5–4.6)
Potassium: 4.1 mmol/L (ref 3.5–5.1)
Sodium: 138 mmol/L (ref 135–145)

## 2023-06-07 LAB — TYPE AND SCREEN
ABO/RH(D): O POS
Antibody Screen: NEGATIVE
Unit division: 0

## 2023-06-07 LAB — MAGNESIUM: Magnesium: 2.5 mg/dL — ABNORMAL HIGH (ref 1.7–2.4)

## 2023-06-07 LAB — MINIMUM INHIBITORY CONCENTRATION (3 DRUG)

## 2023-06-07 MED ORDER — ORAL CARE MOUTH RINSE
15.0000 mL | OROMUCOSAL | Status: DC
  Administered 2023-06-07 – 2023-06-11 (×16): 15 mL via OROMUCOSAL

## 2023-06-07 MED ORDER — ORAL CARE MOUTH RINSE
15.0000 mL | OROMUCOSAL | Status: DC | PRN
Start: 2023-06-07 — End: 2023-06-11

## 2023-06-07 NOTE — Progress Notes (Signed)
 Speech Language Pathology Treatment: Dysphagia  Patient Details Name: Calvin Escobar MRN: [id number] DOB: 1951/05/20 Today's Date: 06/07/2023 Time: 8652-8594 SLP Time Calculation (min) (ACUTE ONLY): 18 min  Assessment / Plan / Recommendation Clinical Impression  Pt seen to assess po tolerance and to reiterate swallow precautions given his h/o esophageal dysmotility.  SLP was able to observe him with his lunch - including tilapia, broccoli, mashed potatoes, Magic Cup and tea.  Adequate swallow/respiratory reciprocity noted -without clinical indication of aspiration with po.  Delayed cough noted x2 during meal = given occurrence was toward end - suspect this is due to known esophageal dysmotility.  Pt does eat rapidly and benefits from cues to slow rate and clear oral cavity.  Recommend advance diet to dys3/thin = utilizing general aspiration and esophageal precautions.   At this time, he has been educated to precautions using teach back and written instructions.  Given pt's poor dentition, rapid rate of po, esophageal dysmotility and current illness - recommend continue mechanical soft diet - pt agreeable to plan.      HPI HPI: 73 y.o. male admitteed with continued falls and weakness since last admission. recent adm 12/9-12/12 for severe sepsis secondary to E. coli bacteremia. 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 urology consulted, general surgery consulted. PMH: HFrEF (recovered), HTN, HLD, BPH, dementia, bipolar disorder, OSA on bipap, and tremors, BPH, C4-C7 . Transfer to SDU with AMS and SOB, increased HR 06/05/23.  Swallow eval ordered.  CXR negative.  Pt with stool retention per images.  Surgery saw pt and approved his diet advancing to dys3/thin.  Pt has had prior UGI study in 04/2021 that showed  Mild esophageal dysmotility.  2. Tiny hiatal herni and a fair amount of residual contrast  in the oropharynx.  From review of flouroscopy loops from UGI study, pt with appearance of secretions retained in pharynx that mixed with barium.      SLP Plan  All goals met      Recommendations for follow up therapy are one component of a multi-disciplinary discharge planning process, led by the attending physician.  Recommendations may be updated based on patient status, additional functional criteria and insurance authorization.    Recommendations  Diet recommendations: Dysphagia 3 (mechanical soft);Thin liquid Liquids provided via: Straw Medication Administration: Other (Comment) Supervision: Patient able to self feed Compensations: Minimize environmental distractions;Slow rate;Small sips/bites Postural Changes and/or Swallow Maneuvers: Seated upright 90 degrees;Upright 30-60 min after meal                  Oral care BID     Dysphagia, unspecified (R13.10)     All goals met    [name], MS [facility] SLP Acute Rehab Services Office [phone]  [name]  06/07/2023, 2:11 PM

## 2023-06-07 NOTE — Progress Notes (Addendum)
 Subjective/Chief Complaint: Patient has no complaints today and reports that he feels quite well.  Per bedside nurse, has had high-volume liquid output per rectum and minimal output per Foley.  Starting to have some skin irritation.  Objective: Vital signs in last 24 hours: Temp:  [98.3 F (36.8 C)-99.2 F (37.3 C)] 98.7 F (37.1 C) (01/03 0814) Pulse Rate:  [76-106] 87 (01/03 0600) Resp:  [18-31] 23 (01/03 0600) BP: (103-168)/(54-90) 130/69 (01/03 0600) SpO2:  [88 %-100 %] 98 % (01/03 0600) Weight:  [110.9 kg] 110.9 kg (01/03 0100) Last BM Date : 06/06/23  Intake/Output from previous day: 01/02 0701 - 01/03 0700 In: 695.9 [I.V.:141.8; Blood:440; IV Piggyback:114.1] Out: -  Intake/Output this shift: No intake/output data recorded.  Elderly male - NAD Mildly confused Abd - soft, non-tender, nondistended.  No palpable mass.  Foley in place with brown-tinged urine.  Rectal pouch currently with 0 output, bedding and gown are completely saturated  Lab Results:  Recent Labs    06/06/23 0310 06/06/23 2214 06/07/23 0305  WBC 9.9  --  7.9  HGB 7.0* 8.0* 8.3*  HCT 23.3* 25.5* 27.0*  PLT 121*  --  164   BMET Recent Labs    06/06/23 0310 06/07/23 0305  NA 135 138  140  K 4.0 4.1  4.2  CL 105 105  106  CO2 22 25  26   GLUCOSE 102* 93  97  BUN 17 19  18   CREATININE 1.33* 0.96  1.11  CALCIUM  7.7* 8.1*  8.2*   Anti-infectives: Anti-infectives (From admission, onward)    Start     Dose/Rate Route Frequency Ordered Stop   06/05/23 1400  piperacillin -tazobactam (ZOSYN ) IVPB 3.375 g        3.375 g 12.5 mL/hr over 240 Minutes Intravenous Every 8 hours 06/05/23 0957 06/10/23 1759   06/05/23 1000  amoxicillin -clavulanate (AUGMENTIN ) 875-125 MG per tablet 1 tablet  Status:  Discontinued        1 tablet Oral Every 12 hours 06/05/23 0757 06/05/23 0956   06/03/23 0945  cefTRIAXone  (ROCEPHIN ) 2 g in sodium chloride  0.9 % 100 mL IVPB  Status:  Discontinued        2  g 200 mL/hr over 30 Minutes Intravenous Every 24 hours 06/02/23 1022 06/02/23 1405   06/02/23 1600  piperacillin -tazobactam (ZOSYN ) IVPB 3.375 g  Status:  Discontinued        3.375 g 12.5 mL/hr over 240 Minutes Intravenous Every 8 hours 06/02/23 1405 06/05/23 0757   06/02/23 1130  metroNIDAZOLE  (FLAGYL ) IVPB 500 mg  Status:  Discontinued        500 mg 100 mL/hr over 60 Minutes Intravenous Every 12 hours 06/02/23 1128 06/02/23 1405   06/02/23 0945  cefTRIAXone  (ROCEPHIN ) 1 g in sodium chloride  0.9 % 100 mL IVPB  Status:  Discontinued        1 g 200 mL/hr over 30 Minutes Intravenous Every 24 hours 06/02/23 0931 06/02/23 1022   06/02/23 0545  aztreonam  (AZACTAM ) 1 g in sodium chloride  0.9 % 100 mL IVPB  Status:  Discontinued        1 g 200 mL/hr over 30 Minutes Intravenous  Once 06/02/23 0534 06/02/23 0534   06/02/23 0545  aztreonam  (AZACTAM ) 2 g in sodium chloride  0.9 % 100 mL IVPB        2 g 200 mL/hr over 30 Minutes Intravenous  Once 06/02/23 0535 06/02/23 0647       Assessment/Plan: Colovesical fistula - likely secondary to  sigmoid diverticulitis Transitioned to PO Augmentin  yesterday, now back on Zosyn  Urosepsis is improving.  Normal WBC and abd soft  Plan:  He does not appear to have an abd source of infection on exam.  May need chronic foley to drain bladder and control infection but will defer to Urology.  New development of what seems to be more urine output per rectum then per Foley.  Discussed placing rectal tube with bedside nurse to further quantify and if truly high-volume rectal urine output despite patent foley, may benefit from [facility] nephrostomy tubes, discussed with urology NP, versus more expedited colon resection/bladder repair. Would consider engaging palliative care to cement goals of care and family wishes.   Treat conservatively with antibiotics and source control.  Will hopefully be able to transition to outpatient work-up with colonoscopy, cardiac clearance.   Eventual elective colon resection with takedown of colovesical fistula.   Currently, no indications for urgent surgical intervention.   FTE - Advance to soft diet   Principal Problem:   Septic shock from UTI Active Problems:   Bipolar affective disorder, depressed (HCC)   Dementia (HCC)   Hyperlipidemia   OSA treated with BiPAP   HFrEF (heart failure with reduced ejection fraction) (HCC)   Benign prostatic hyperplasia with urinary obstruction   Stage 3 chronic kidney disease (HCC)   Normocytic anemia   Tremor   Acute respiratory failure with hypoxia (HCC)   Thrombocytopenia (HCC)   LBBB (left bundle branch block)  LOS: 4 days    Mitzie DELENA Freund 06/07/2023

## 2023-06-07 NOTE — Progress Notes (Signed)
     Subjective: First time meeting Calvin Escobar this morning. He reports sleeping well last night and feeling better today. Minimal UOP in foley, with around 20 bowel movements, consisting primarily of urine per rectum yesterday. Skin breakdown resulted, necessitating rectal tube.   Objective: Vital signs in last 24 hours: Temp:  [98.3 F (36.8 C)-99.2 F (37.3 C)] 98.7 F (37.1 C) (01/03 0814) Pulse Rate:  [76-106] 87 (01/03 0600) Resp:  [18-31] 23 (01/03 0600) BP: (105-168)/(54-90) 130/69 (01/03 0600) SpO2:  [88 %-100 %] 98 % (01/03 0600) Weight:  [110.9 kg] 110.9 kg (01/03 0100)  Assessment/Plan: #colovesical fistula Plan per colorectal surgery. Plans to optimize pt medically and unlikely to have surgery during this admission  Considering urine diversion to rectum, seems more likely that the foley has become obstructed d/t feculent material. Plan to order scheduled hand irrigation to confirm patency and upsize foley today. Bilateral PCNT could be considered if all other measures fail.   Intake/Output from previous day: 01/02 0701 - 01/03 0700 In: 695.9 [I.V.:141.8; Blood:440; IV Piggyback:114.1] Out: -   Intake/Output this shift: Total I/O In: 19.7 [IV Piggyback:19.7] Out: -   Physical Exam:  General: Alert and oriented CV: No cyanosis Lungs: equal chest rise Abdomen: Soft, NTND, no rebound or guarding Gu: foley in place draining clear yellow urine with minimal feculant material settled in the bottom of drainage bag.   Lab Results: Recent Labs    06/06/23 0310 06/06/23 2214 06/07/23 0305  HGB 7.0* 8.0* 8.3*  HCT 23.3* 25.5* 27.0*   BMET Recent Labs    06/06/23 0310 06/07/23 0305  NA 135 138  140  K 4.0 4.1  4.2  CL 105 105  106  CO2 22 25  26   GLUCOSE 102* 93  97  BUN 17 19  18   CREATININE 1.33* 0.96  1.11  CALCIUM  7.7* 8.1*  8.2*     Studies/Results: DG CHEST PORT 1 VIEW Result Date: 06/05/2023 CLINICAL DATA:  Shortness of breath. EXAM: PORTABLE  CHEST 1 VIEW COMPARISON:  Chest radiograph dated 06/02/2023. FINDINGS: The heart size and mediastinal contours are within normal limits. The lung volumes are low. Both lungs are clear. The visualized skeletal structures are unremarkable. IMPRESSION: Low lung volumes. No active disease. Electronically Signed   By: Norman Hopper M.D.   On: 06/05/2023 12:55      LOS: 4 days   Ole Bourdon, NP [facility] Pager: [phone]  06/07/2023, 9:12 AM

## 2023-06-07 NOTE — Progress Notes (Signed)
 [facility] PROGRESS NOTE   Calvin Escobar FMW:[id number] DOB: Dec 20, 1950 DOA: 06/02/2023  PCP: [name], MD  Brief History: PMH of HTN, HLD, BPH, HFrEF, bipolar disorder, OSA on CPAP, tremor presented to hospital with complaints of confusion and fall. 12/8 - 12/12 hospitalized for confusion and was treated for severe sepsis secondary to E. coli bacteremia from UTI-emphysematous cystitis. Has not been eating or drinking prior to admission and had worsening tremors and then had a fall and unable to get up all by himself and therefore was brought to the hospital. Currently being treated for severe sepsis from Pseudomonas UTI. Concern was also raised for colovesical fistula.  Consultants: General surgery.  Urology.  Critical care medicine  Procedures: None    Subjective/Interval History: Patient denies any complaints.  Discussed with nursing staff reports a lot of urine output from his rectal area.    Assessment/Plan:  Septic shock from Pseudomonas UTI Met SIRS criteria with fever, tachypnea.  Evidence of infection in the urine.  Organ damage with AKI, lactic acidosis as well as hypotension. Treated with IV fluid.  Responded well.  No evidence of shock. To be transferred to ICU due to shock on 1/28.  Required IV vasopressors briefly.  Blood pressure has stabilized.  Patient noted to be on midodrine .  Off of vasopressors.   Colovesical fistula. CT abdomen pelvis with and without contrast shows evidence of large amount of gas and debris's in the urinary bladder along with moderate right-sided hydronephrosis.  As well as concern for colovesical fistula. General surgery was consulted as well as urology. Continues to have a lot of urine output from his rectum as a result of the fistula. No plans for surgical intervention noted.  Conservative management will be placed for now. Seen by general surgery and urology again this morning.  If these conservative measures fail  percutaneous nephrostomy to be considered according to the notes. Recently had a cystoscopy which did not show any evidence of colovesical fistula. Prior to any colectomy patient will need to undergo colonoscopy and cardiac clearance.  Again no plans for any surgery currently so does not need GI and cardiology involvement at this time.     Acute respiratory failure with hypoxia. Possible aspiration event in sleep. On 1/1 patient woke up from his nap with sudden onset of shortness of breath with upper airway wheezing.  His heart rate went up to 160s and respiratory rate was in 30s.   Patient was transferred to the ICU.  Placed on BiPAP.  He slowly improved. Respiratory status appears to be stable.  CPAP nightly.  BiPAP as needed.   Recurrent fall. Most likely syncope in the setting of sepsis and hypotension. Similar presentation recently which improved with hydration and treatment of sepsis.   Chronic HFrEF. Echocardiogram from December 2024 showed EF to be 50 to 55%.  Echo from October 2024 showed EF to be 35 to 40%. Does not appear to be volume overloaded currently.  Continue to monitor closely.  Due to recent shock all of his blood pressure lowering agents are on hold.  OSA. On CPAP.  Will continue.   Chronic tremors. On primidone .   History of dementia. Delirium secondary to acute metabolic encephalopathy. Currently stable.   Bipolar disorder. On home regimen including Depakote , BuSpar , trazodone , Remeron  and Abilify .   HLD. On Pravachol .  Continue.   Concern for CKD-ruled out. GFR appears to be more than 60 right now. Serum creatinine 1.07. Most likely presentation actually associated with  mild renal insufficiency with serum creatinine 1.25.    Obesity Class 1 Body mass index is 31.8 kg/m.  Placing the pt at higher risk of poor outcomes.    Dysphagia Speech therapy consulted. Currently recommending dysphagia 2 diet. Anticipate improvement as mentation  improves.  DVT Prophylaxis: SCDs Code Status: Full code Family Communication: Discussed with patient.  No family at bedside Disposition Plan: Home health is recommended at discharge     Medications: Scheduled:  ARIPiprazole   2 mg Oral Daily   busPIRone   10 mg Oral TID   Chlorhexidine  Gluconate Cloth  6 each Topical Daily   divalproex   500 mg Oral QHS   famotidine   20 mg Oral BID   feeding supplement  237 mL Oral TID BM   fluticasone   1 spray Each Nare Daily   folic acid   1 mg Oral Daily   gabapentin   300 mg Oral QHS   midodrine   5 mg Oral TID WC   mirtazapine   30 mg Oral QHS   nystatin   5 mL Oral QID   mouth rinse  15 mL Mouth Rinse 4 times per day   primidone   50 mg Oral BID   saccharomyces boulardii  250 mg Oral BID   traZODone   150 mg Oral QHS   venlafaxine  XR  300 mg Oral Q breakfast   Continuous:  piperacillin -tazobactam (ZOSYN )  IV 3.375 g (06/07/23 0934)   PRN:acetaminophen  **OR** acetaminophen , ipratropium, ketorolac , lip balm, LORazepam , morphine  injection, ondansetron  **OR** ondansetron  (ZOFRAN ) IV, mouth rinse, sodium chloride   Antibiotics: Anti-infectives (From admission, onward)    Start     Dose/Rate Route Frequency Ordered Stop   06/05/23 1400  piperacillin -tazobactam (ZOSYN ) IVPB 3.375 g        3.375 g 12.5 mL/hr over 240 Minutes Intravenous Every 8 hours 06/05/23 0957 06/10/23 1759   06/05/23 1000  amoxicillin -clavulanate (AUGMENTIN ) 875-125 MG per tablet 1 tablet  Status:  Discontinued        1 tablet Oral Every 12 hours 06/05/23 0757 06/05/23 0956   06/03/23 0945  cefTRIAXone  (ROCEPHIN ) 2 g in sodium chloride  0.9 % 100 mL IVPB  Status:  Discontinued        2 g 200 mL/hr over 30 Minutes Intravenous Every 24 hours 06/02/23 1022 06/02/23 1405   06/02/23 1600  piperacillin -tazobactam (ZOSYN ) IVPB 3.375 g  Status:  Discontinued        3.375 g 12.5 mL/hr over 240 Minutes Intravenous Every 8 hours 06/02/23 1405 06/05/23 0757   06/02/23 1130  metroNIDAZOLE   (FLAGYL ) IVPB 500 mg  Status:  Discontinued        500 mg 100 mL/hr over 60 Minutes Intravenous Every 12 hours 06/02/23 1128 06/02/23 1405   06/02/23 0945  cefTRIAXone  (ROCEPHIN ) 1 g in sodium chloride  0.9 % 100 mL IVPB  Status:  Discontinued        1 g 200 mL/hr over 30 Minutes Intravenous Every 24 hours 06/02/23 0931 06/02/23 1022   06/02/23 0545  aztreonam  (AZACTAM ) 1 g in sodium chloride  0.9 % 100 mL IVPB  Status:  Discontinued        1 g 200 mL/hr over 30 Minutes Intravenous  Once 06/02/23 0534 06/02/23 0534   06/02/23 0545  aztreonam  (AZACTAM ) 2 g in sodium chloride  0.9 % 100 mL IVPB        2 g 200 mL/hr over 30 Minutes Intravenous  Once 06/02/23 0535 06/02/23 0647       Objective:  Vital Signs  Vitals:  06/07/23 0530 06/07/23 0550 06/07/23 0600 06/07/23 0814  BP: 133/71  130/69   Pulse: 92 95 87   Resp: (!) 23 (!) 21 (!) 23   Temp:    98.7 F (37.1 C)  TempSrc:    Axillary  SpO2: 95% (!) 88% 98%   Weight:      Height:        Intake/Output Summary (Last 24 hours) at 06/07/2023 1113 Last data filed at 06/07/2023 1000 Gross per 24 hour  Intake 679.42 ml  Output 800 ml  Net -120.58 ml   Filed Weights   06/02/23 0531 06/05/23 1313 06/07/23 0100  Weight: 109.3 kg 110.9 kg 110.9 kg    General appearance: Awake alert.  In no distress.  Distracted Resp: Clear to auscultation bilaterally.  Normal effort Cardio: S1-S2 is normal regular.  No S3-S4.  No rubs murmurs or bruit GI: Abdomen is soft.  Nontender nondistended.  Bowel sounds are present normal.  No masses organomegaly Extremities: No edema.  Full range of motion of lower extremities. Neurologic: No focal neurological deficits.    Lab Results:  Data Reviewed: I have personally reviewed following labs and reports of the imaging studies  CBC: Recent Labs  Lab 06/02/23 0524 06/02/23 0956 06/04/23 0925 06/05/23 0503 06/05/23 2157 06/06/23 0310 06/06/23 2214 06/07/23 0305  WBC 10.6*   < > 5.1 6.1 13.2*  9.9  --  7.9  NEUTROABS 8.2*  --  2.8  --   --   --   --  4.8  HGB 8.8*   < > 8.1* 8.4* 7.7* 7.0* 8.0* 8.3*  HCT 28.5*   < > 27.4* 28.2* 25.7* 23.3* 25.5* 27.0*  MCV 89.1   < > 92.6 91.0 92.8 93.6  --  89.7  PLT 110*   < > 91* 126* 139* 121*  --  164   < > = values in this interval not displayed.    Basic Metabolic Panel: Recent Labs  Lab 06/03/23 0432 06/04/23 0925 06/05/23 0503 06/06/23 0310 06/07/23 0305  NA 133* 136 135 135 138  140  K 4.0 4.0 3.5 4.0 4.1  4.2  CL 103 104 101 105 105  106  CO2 24 28 27 22 25  26   GLUCOSE 105* 115* 106* 102* 93  97  BUN 16 15 15 17 19  18   CREATININE 1.07 0.94 0.98 1.33* 0.96  1.11  CALCIUM  7.7* 8.0* 8.0* 7.7* 8.1*  8.2*  MG  --   --  2.1 2.4 2.5*  PHOS  --  3.1 2.4* 3.6  3.3 3.1    GFR: Estimated Creatinine Clearance: 88.1 mL/min (by C-G formula based on SCr of 0.96 mg/dL).  Liver Function Tests: Recent Labs  Lab 06/02/23 0524 06/03/23 0432 06/04/23 0925 06/05/23 0503 06/06/23 0310 06/07/23 0305  AST 18 33  --   --   --  33  ALT 10 14  --   --   --  23  ALKPHOS 51 42  --   --   --  49  BILITOT 0.5 0.4  --   --   --  0.5  PROT 6.5 6.3*  --   --   --  6.5  ALBUMIN  2.6* 2.6* 2.4* 2.5* 2.6* 2.7*  2.8*     Coagulation Profile: Recent Labs  Lab 06/02/23 0524  INR 1.2    Cardiac Enzymes: Recent Labs  Lab 06/02/23 0928  CKTOTAL 291     Recent Results (from the past 240  hours)  Urine Culture     Status: Abnormal (Preliminary result)   Collection Time: 06/02/23  5:21 AM   Specimen: Urine, Random  Result Value Ref Range Status   Specimen Description URINE, RANDOM  Final   Special Requests NONE Reflexed from [id number]  Final   Culture (A)  Final    >=100,000 COLONIES/mL PSEUDOMONAS AERUGINOSA Sent to [facility] for further susceptibility testing. Performed at [facility], [address] [place], [place] [address]    Report Status PENDING  Incomplete  Blood Culture (routine x 2)     Status: None    Collection Time: 06/02/23  5:48 AM   Specimen: BLOOD RIGHT HAND  Result Value Ref Range Status   Specimen Description BLOOD RIGHT HAND  Final   Special Requests   Final    BOTTLES DRAWN AEROBIC AND ANAEROBIC Blood Culture adequate volume   Culture   Final    NO GROWTH 5 DAYS Performed at [facility], [address] [place], [place] [address]    Report Status 06/07/2023 FINAL  Final  Blood Culture (routine x 2)     Status: None   Collection Time: 06/02/23  5:48 AM   Specimen: BLOOD RIGHT ARM  Result Value Ref Range Status   Specimen Description BLOOD RIGHT ARM  Final   Special Requests   Final    BOTTLES DRAWN AEROBIC AND ANAEROBIC Blood Culture adequate volume   Culture   Final    NO GROWTH 5 DAYS Performed at [facility], [address], [place], [place] [address]    Report Status 06/07/2023 FINAL  Final  Resp panel by RT-PCR (RSV, Flu A&B, Covid) Anterior Nasal Swab     Status: None   Collection Time: 06/02/23  5:58 AM   Specimen: Anterior Nasal Swab  Result Value Ref Range Status   SARS Coronavirus 2 by RT PCR NEGATIVE NEGATIVE Final   Influenza A by PCR NEGATIVE NEGATIVE Final   Influenza B by PCR NEGATIVE NEGATIVE Final    Comment: (NOTE) The Xpert Xpress SARS-CoV-2/FLU/RSV plus assay is intended as an aid in the diagnosis of influenza from Nasopharyngeal swab specimens and should not be used as a sole basis for treatment. Nasal washings and aspirates are unacceptable for Xpert Xpress SARS-CoV-2/FLU/RSV testing.  Fact Sheet for Patients: bloggercourse.com  Fact Sheet for Healthcare Providers: seriousbroker.it  This test is not yet approved or cleared by the [place]  FDA and has been authorized for detection and/or diagnosis of SARS-CoV-2 by FDA under an Emergency Use Authorization (EUA). This EUA will remain in effect (meaning this test can be used) for the duration of the COVID-19 declaration  under Section 564(b)(1) of the Act, 21 U.S.C. section 360bbb-3(b)(1), unless the authorization is terminated or revoked.     Resp Syncytial Virus by PCR NEGATIVE NEGATIVE Final    Comment: (NOTE) Fact Sheet for Patients: bloggercourse.com  Fact Sheet for Healthcare Providers: seriousbroker.it  This test is not yet approved or cleared by the [place]  FDA and has been authorized for detection and/or diagnosis of SARS-CoV-2 by FDA under an Emergency Use Authorization (EUA). This EUA will remain in effect (meaning this test can be used) for the duration of the COVID-19 declaration under Section 564(b)(1) of the Act, 21 U.S.C. section 360bbb-3(b)(1), unless the authorization is terminated or revoked.  Performed at [facility], [address] [place], [place] [address]   MRSA Next Gen by PCR, Nasal  Status: None   Collection Time: 06/05/23  3:15 PM   Specimen: Nasal Mucosa; Nasal Swab  Result Value Ref Range Status   MRSA by PCR Next Gen NOT DETECTED NOT DETECTED Final    Comment: (NOTE) The GeneXpert MRSA Assay (FDA approved for NASAL specimens only), is one component of a comprehensive MRSA colonization surveillance program. It is not intended to diagnose MRSA infection nor to guide or monitor treatment for MRSA infections. Test performance is not FDA approved in patients less than 51 years old. Performed at [facility], [address] [place], [place] [address]       Radiology Studies: DG CHEST PORT 1 VIEW Result Date: 06/07/2023 CLINICAL DATA:  Shortness of breath. EXAM: PORTABLE CHEST 1 VIEW COMPARISON:  Chest radiographs 06/05/2023, 06/02/2023, 05/12/2023, 01/29/2016; CT chest 03/16/2008 FINDINGS: Cardiac silhouette and mediastinal contours are within normal limits for AP technique. Unchanged chronic benign calcified granuloma within the lateral right midlung. Moderately decreased lung  volumes. Mild bilateral likely chronic interstitial thickening is unchanged from prior. No pleural effusion or pneumothorax. No acute skeletal abnormality. IMPRESSION: Moderately decreased lung volumes. No acute cardiopulmonary process. Electronically Signed   By: Tanda Lyons M.D.   On: 06/07/2023 11:01   DG CHEST PORT 1 VIEW Result Date: 06/05/2023 CLINICAL DATA:  Shortness of breath. EXAM: PORTABLE CHEST 1 VIEW COMPARISON:  Chest radiograph dated 06/02/2023. FINDINGS: The heart size and mediastinal contours are within normal limits. The lung volumes are low. Both lungs are clear. The visualized skeletal structures are unremarkable. IMPRESSION: Low lung volumes. No active disease. Electronically Signed   By: Norman Hopper M.D.   On: 06/05/2023 12:55       LOS: 4 days   Calvin Escobar  [facility] Pager on [email or web address]  06/07/2023, 11:13 AM

## 2023-06-08 DIAGNOSIS — F0394 Unspecified dementia, unspecified severity, with anxiety: Secondary | ICD-10-CM | POA: Diagnosis not present

## 2023-06-08 DIAGNOSIS — F3131 Bipolar disorder, current episode depressed, mild: Secondary | ICD-10-CM | POA: Diagnosis not present

## 2023-06-08 DIAGNOSIS — A419 Sepsis, unspecified organism: Secondary | ICD-10-CM | POA: Diagnosis not present

## 2023-06-08 DIAGNOSIS — N321 Vesicointestinal fistula: Secondary | ICD-10-CM | POA: Diagnosis not present

## 2023-06-08 LAB — PHOSPHORUS: Phosphorus: 3.6 mg/dL (ref 2.5–4.6)

## 2023-06-08 LAB — BASIC METABOLIC PANEL
Anion gap: 6 (ref 5–15)
BUN: 15 mg/dL (ref 8–23)
CO2: 28 mmol/L (ref 22–32)
Calcium: 8.3 mg/dL — ABNORMAL LOW (ref 8.9–10.3)
Chloride: 105 mmol/L (ref 98–111)
Creatinine, Ser: 0.95 mg/dL (ref 0.61–1.24)
GFR, Estimated: >60 mL/min (ref >60)
Glucose, Bld: 87 mg/dL (ref 70–99)
Potassium: 4.1 mmol/L (ref 3.5–5.1)
Sodium: 139 mmol/L (ref 135–145)

## 2023-06-08 LAB — MAGNESIUM: Magnesium: 2.5 mg/dL — ABNORMAL HIGH (ref 1.7–2.4)

## 2023-06-08 NOTE — Progress Notes (Signed)
 [facility] PROGRESS NOTE   Calvin Escobar FMW:[id number] DOB: 25-Dec-1950 DOA: 06/02/2023  PCP: [name], MD  Brief History: PMH of HTN, HLD, BPH, HFrEF, bipolar disorder, OSA on CPAP, tremor presented to hospital with complaints of confusion and fall. 12/8 - 12/12 hospitalized for confusion and was treated for severe sepsis secondary to E. coli bacteremia from UTI-emphysematous cystitis. Has not been eating or drinking prior to admission and had worsening tremors and then had a fall and unable to get up all by himself and therefore was brought to the hospital. Currently being treated for severe sepsis from Pseudomonas UTI. Concern was also raised for colovesical fistula.  Consultants: General surgery.  Urology.  Critical care medicine  Procedures: None    Subjective/Interval History: Patient denies any abdominal pain nausea or vomiting.  Mentions that he continues to pass some urine from his rectal area.  He was told about his fistula.   Assessment/Plan:  Septic shock from Pseudomonas UTI Met SIRS criteria with fever, tachypnea.  Evidence of infection in the urine.  Organ damage with AKI, lactic acidosis as well as hypotension. Treated with IV fluid.  Responded well.  No evidence of shock. Had to be transferred to ICU due to shock on 1/28.  Required IV vasopressors briefly.  Blood pressure has stabilized.  Patient noted to be on midodrine .  Off of vasopressors. Seems to be stable from a hemodynamic standpoint. Await final urine culture report before transitioning to oral antibiotics.   Colovesical fistula. CT abdomen pelvis with and without contrast shows evidence of large amount of gas and debris's in the urinary bladder along with moderate right-sided hydronephrosis.  As well as concern for colovesical fistula. General surgery was consulted as well as urology. Continues to have a lot of urine output from his rectum as a result of the fistula. No plans for  surgical intervention noted.  Conservative management will be placed for now. General surgery and urology continues to follow.  Conservative management is being pursued.  If these conservative measures fail percutaneous nephrostomy to be considered according to the notes. Recently had a cystoscopy which did not show any evidence of colovesical fistula. Prior to any colectomy patient will need to undergo colonoscopy and cardiac clearance.  Again no plans for any surgery currently so does not need GI and cardiology involvement at this time.     Acute respiratory failure with hypoxia. Possible aspiration event in sleep. On 1/1 patient woke up from his nap with sudden onset of shortness of breath with upper airway wheezing.  His heart rate went up to 160s and respiratory rate was in 30s.   Patient was transferred to the ICU.  Placed on BiPAP.  He slowly improved. Respiratory status appears to be stable.  CPAP nightly.  BiPAP as needed.   Recurrent fall. Most likely syncope in the setting of sepsis and hypotension. Similar presentation recently which improved with hydration and treatment of sepsis.   Chronic HFrEF. Echocardiogram from December 2024 showed EF to be 50 to 55%.  Echo from October 2024 showed EF to be 35 to 40%. Does not appear to be volume overloaded currently.  Continue to monitor closely.   Due to recent shock all of his blood pressure lowering agents are on hold.  OSA. On CPAP.  Will continue.   Chronic tremors. On primidone .   History of dementia. Delirium secondary to acute metabolic encephalopathy. Currently stable.   Bipolar disorder. On home regimen including Depakote , BuSpar , trazodone , Remeron  and  Abilify .   HLD. On Pravachol .  Continue.   Concern for CKD-ruled out. GFR appears to be more than 60 right now. Serum creatinine 1.07. Most likely presentation actually associated with mild renal insufficiency with serum creatinine 1.25.    Obesity Class 1 Body  mass index is 31.8 kg/m.  Placing the pt at higher risk of poor outcomes.    Dysphagia Speech therapy has been following.  Currently on dysphagia 3 diet.  DVT Prophylaxis: SCDs Code Status: Full code Family Communication: Discussed with patient.  No family at bedside.  Will update his wife later today. Disposition Plan: Home health is recommended at discharge     Medications: Scheduled:  ARIPiprazole   2 mg Oral Daily   busPIRone   10 mg Oral TID   Chlorhexidine  Gluconate Cloth  6 each Topical Daily   divalproex   500 mg Oral QHS   famotidine   20 mg Oral BID   feeding supplement  237 mL Oral TID BM   fluticasone   1 spray Each Nare Daily   folic acid   1 mg Oral Daily   gabapentin   300 mg Oral QHS   midodrine   5 mg Oral TID WC   mirtazapine   30 mg Oral QHS   nystatin   5 mL Oral QID   mouth rinse  15 mL Mouth Rinse 4 times per day   primidone   50 mg Oral BID   saccharomyces boulardii  250 mg Oral BID   traZODone   150 mg Oral QHS   venlafaxine  XR  300 mg Oral Q breakfast   Continuous:  piperacillin -tazobactam (ZOSYN )  IV 3.375 g (06/08/23 0216)   PRN:acetaminophen  **OR** acetaminophen , ipratropium, ketorolac , lip balm, LORazepam , morphine  injection, ondansetron  **OR** ondansetron  (ZOFRAN ) IV, mouth rinse, sodium chloride   Antibiotics: Anti-infectives (From admission, onward)    Start     Dose/Rate Route Frequency Ordered Stop   06/05/23 1400  piperacillin -tazobactam (ZOSYN ) IVPB 3.375 g        3.375 g 12.5 mL/hr over 240 Minutes Intravenous Every 8 hours 06/05/23 0957 06/10/23 1759   06/05/23 1000  amoxicillin -clavulanate (AUGMENTIN ) 875-125 MG per tablet 1 tablet  Status:  Discontinued        1 tablet Oral Every 12 hours 06/05/23 0757 06/05/23 0956   06/03/23 0945  cefTRIAXone  (ROCEPHIN ) 2 g in sodium chloride  0.9 % 100 mL IVPB  Status:  Discontinued        2 g 200 mL/hr over 30 Minutes Intravenous Every 24 hours 06/02/23 1022 06/02/23 1405   06/02/23 1600   piperacillin -tazobactam (ZOSYN ) IVPB 3.375 g  Status:  Discontinued        3.375 g 12.5 mL/hr over 240 Minutes Intravenous Every 8 hours 06/02/23 1405 06/05/23 0757   06/02/23 1130  metroNIDAZOLE  (FLAGYL ) IVPB 500 mg  Status:  Discontinued        500 mg 100 mL/hr over 60 Minutes Intravenous Every 12 hours 06/02/23 1128 06/02/23 1405   06/02/23 0945  cefTRIAXone  (ROCEPHIN ) 1 g in sodium chloride  0.9 % 100 mL IVPB  Status:  Discontinued        1 g 200 mL/hr over 30 Minutes Intravenous Every 24 hours 06/02/23 0931 06/02/23 1022   06/02/23 0545  aztreonam  (AZACTAM ) 1 g in sodium chloride  0.9 % 100 mL IVPB  Status:  Discontinued        1 g 200 mL/hr over 30 Minutes Intravenous  Once 06/02/23 0534 06/02/23 0534   06/02/23 0545  aztreonam  (AZACTAM ) 2 g in sodium chloride  0.9 % 100  mL IVPB        2 g 200 mL/hr over 30 Minutes Intravenous  Once 06/02/23 0535 06/02/23 0647       Objective:  Vital Signs  Vitals:   06/07/23 1703 06/07/23 2113 06/08/23 0159 06/08/23 0537  BP: 137/80 128/72 (!) 140/93 130/70  Pulse: (!) 102 88 95 78  Resp: 16     Temp: 98 F (36.7 C) 98.3 F (36.8 C) 98.6 F (37 C) 98.7 F (37.1 C)  TempSrc: Oral Oral Oral Oral  SpO2: 97% 94%  93%  Weight:      Height:        Intake/Output Summary (Last 24 hours) at 06/08/2023 0858 Last data filed at 06/08/2023 0730 Gross per 24 hour  Intake 549.65 ml  Output 4150 ml  Net -3600.35 ml   Filed Weights   06/02/23 0531 06/05/23 1313 06/07/23 0100  Weight: 109.3 kg 110.9 kg 110.9 kg    General appearance: Awake alert.  In no distress Resp: Clear to auscultation bilaterally.  Normal effort Cardio: S1-S2 is normal regular.  No S3-S4.  No rubs murmurs or bruit GI: Abdomen is soft.  Nontender nondistended.  Bowel sounds are present normal.  No masses organomegaly Extremities: No edema.  Moving all of his extremities Neurologic: No obvious focal neurological deficits.  Lab Results:  Data Reviewed: I have personally  reviewed following labs and reports of the imaging studies  CBC: Recent Labs  Lab 06/02/23 0524 06/02/23 0956 06/04/23 0925 06/05/23 0503 06/05/23 2157 06/06/23 0310 06/06/23 2214 06/07/23 0305  WBC 10.6*   < > 5.1 6.1 13.2* 9.9  --  7.9  NEUTROABS 8.2*  --  2.8  --   --   --   --  4.8  HGB 8.8*   < > 8.1* 8.4* 7.7* 7.0* 8.0* 8.3*  HCT 28.5*   < > 27.4* 28.2* 25.7* 23.3* 25.5* 27.0*  MCV 89.1   < > 92.6 91.0 92.8 93.6  --  89.7  PLT 110*   < > 91* 126* 139* 121*  --  164   < > = values in this interval not displayed.    Basic Metabolic Panel: Recent Labs  Lab 06/04/23 0925 06/05/23 0503 06/06/23 0310 06/07/23 0305 06/08/23 0434  NA 136 135 135 138  140 139  K 4.0 3.5 4.0 4.1  4.2 4.1  CL 104 101 105 105  106 105  CO2 28 27 22 25  26 28   GLUCOSE 115* 106* 102* 93  97 87  BUN 15 15 17 19  18 15   CREATININE 0.94 0.98 1.33* 0.96  1.11 0.95  CALCIUM  8.0* 8.0* 7.7* 8.1*  8.2* 8.3*  MG  --  2.1 2.4 2.5* 2.5*  PHOS 3.1 2.4* 3.6  3.3 3.1 3.6    GFR: Estimated Creatinine Clearance: 89.1 mL/min (by C-G formula based on SCr of 0.95 mg/dL).  Liver Function Tests: Recent Labs  Lab 06/02/23 0524 06/03/23 0432 06/04/23 0925 06/05/23 0503 06/06/23 0310 06/07/23 0305  AST 18 33  --   --   --  33  ALT 10 14  --   --   --  23  ALKPHOS 51 42  --   --   --  49  BILITOT 0.5 0.4  --   --   --  0.5  PROT 6.5 6.3*  --   --   --  6.5  ALBUMIN  2.6* 2.6* 2.4* 2.5* 2.6* 2.7*  2.8*  Coagulation Profile: Recent Labs  Lab 06/02/23 0524  INR 1.2    Cardiac Enzymes: Recent Labs  Lab 06/02/23 0928  CKTOTAL 291     Recent Results (from the past 240 hours)  Urine Culture     Status: Abnormal (Preliminary result)   Collection Time: 06/02/23  5:21 AM   Specimen: Urine, Random  Result Value Ref Range Status   Specimen Description URINE, RANDOM  Final   Special Requests NONE Reflexed from [id number]  Final   Culture (A)  Final    >=100,000 COLONIES/mL PSEUDOMONAS  AERUGINOSA Sent to [facility] for further susceptibility testing. Performed at [facility], [address] [place], [place] [address]    Report Status PENDING  Incomplete  Blood Culture (routine x 2)     Status: None   Collection Time: 06/02/23  5:48 AM   Specimen: BLOOD RIGHT HAND  Result Value Ref Range Status   Specimen Description BLOOD RIGHT HAND  Final   Special Requests   Final    BOTTLES DRAWN AEROBIC AND ANAEROBIC Blood Culture adequate volume   Culture   Final    NO GROWTH 5 DAYS Performed at [facility], [address] [place], [place] [address]    Report Status 06/07/2023 FINAL  Final  Blood Culture (routine x 2)     Status: None   Collection Time: 06/02/23  5:48 AM   Specimen: BLOOD RIGHT ARM  Result Value Ref Range Status   Specimen Description BLOOD RIGHT ARM  Final   Special Requests   Final    BOTTLES DRAWN AEROBIC AND ANAEROBIC Blood Culture adequate volume   Culture   Final    NO GROWTH 5 DAYS Performed at [facility], [address] [place], [place] [address]    Report Status 06/07/2023 FINAL  Final  Resp panel by RT-PCR (RSV, Flu A&B, Covid) Anterior Nasal Swab     Status: None   Collection Time: 06/02/23  5:58 AM   Specimen: Anterior Nasal Swab  Result Value Ref Range Status   SARS Coronavirus 2 by RT PCR NEGATIVE NEGATIVE Final   Influenza A by PCR NEGATIVE NEGATIVE Final   Influenza B by PCR NEGATIVE NEGATIVE Final    Comment: (NOTE) The Xpert Xpress SARS-CoV-2/FLU/RSV plus assay is intended as an aid in the diagnosis of influenza from Nasopharyngeal swab specimens and should not be used as a sole basis for treatment. Nasal washings and aspirates are unacceptable for Xpert Xpress SARS-CoV-2/FLU/RSV testing.  Fact Sheet for Patients: bloggercourse.com  Fact Sheet for Healthcare Providers: seriousbroker.it  This test is not yet approved or cleared by the [place]  FDA  and has been authorized for detection and/or diagnosis of SARS-CoV-2 by FDA under an Emergency Use Authorization (EUA). This EUA will remain in effect (meaning this test can be used) for the duration of the COVID-19 declaration under Section 564(b)(1) of the Act, 21 U.S.C. section 360bbb-3(b)(1), unless the authorization is terminated or revoked.     Resp Syncytial Virus by PCR NEGATIVE NEGATIVE Final    Comment: (NOTE) Fact Sheet for Patients: bloggercourse.com  Fact Sheet for Healthcare Providers: seriousbroker.it  This test is not yet approved or cleared by the [place]  FDA and has been authorized for detection and/or diagnosis of SARS-CoV-2 by FDA under an Emergency Use Authorization (EUA). This EUA will remain in effect (meaning this test can be used) for the duration of the COVID-19 declaration under Section 564(b)(1) of the Act, 21 U.S.C.  section 360bbb-3(b)(1), unless the authorization is terminated or revoked.  Performed at [facility], [address], [place], [place] [address]   MIC (3 Drug) -Urine Culture [[id number]]; 06/02/2023; Urine, Random; pseudomonas aeruginosa; Piperacillin Nadine; Other; cefepime , ceftazidime; Other; imipenem; Patient immune status: Normal     Status: None   Collection Time: 06/05/23  7:37 AM   Specimen: Urine, Random; Blood  Result Value Ref Range Status   Min Inhibitory Conc (3 Drugs) REFERT  Corrected    Comment: (NOTE) Please refer to the following specimen for additional lab results. Please refer to spec # [id number] for test result. Almarie Schneider notified 06-07-2023 at 17:55. Performed At: [facility] [address] [place], [place] [id number] Jennette Shorter MD Ey:[id number] CORRECTED ON 01/03 AT 1835: PREVIOUSLY REPORTED AS Preliminary report    Source Urine, Random  Final    Comment: Performed at [facility], [address],  [place], [place] [address]  Result     Status: None   Collection Time: 06/05/23  7:37 AM  Result Value Ref Range Status   Result 1 Comment  Final    Comment: (NOTE) Pseudomonas aeruginosa Identification performed by account, not confirmed by this laboratory. Performed At: [facility] [address] [place], [place] [id number] Jennette Shorter MD Ey:[id number]   MRSA Next Gen by PCR, Nasal     Status: None   Collection Time: 06/05/23  3:15 PM   Specimen: Nasal Mucosa; Nasal Swab  Result Value Ref Range Status   MRSA by PCR Next Gen NOT DETECTED NOT DETECTED Final    Comment: (NOTE) The GeneXpert MRSA Assay (FDA approved for NASAL specimens only), is one component of a comprehensive MRSA colonization surveillance program. It is not intended to diagnose MRSA infection nor to guide or monitor treatment for MRSA infections. Test performance is not FDA approved in patients less than 22 years old. Performed at [facility], [address], [place], [place] [address]       Radiology Studies: DG CHEST PORT 1 VIEW Result Date: 06/07/2023 CLINICAL DATA:  Shortness of breath. EXAM: PORTABLE CHEST 1 VIEW COMPARISON:  Chest radiographs 06/05/2023, 06/02/2023, 05/12/2023, 01/29/2016; CT chest 03/16/2008 FINDINGS: Cardiac silhouette and mediastinal contours are within normal limits for AP technique. Unchanged chronic benign calcified granuloma within the lateral right midlung. Moderately decreased lung volumes. Mild bilateral likely chronic interstitial thickening is unchanged from prior. No pleural effusion or pneumothorax. No acute skeletal abnormality. IMPRESSION: Moderately decreased lung volumes. No acute cardiopulmonary process. Electronically Signed   By: Tanda Lyons M.D.   On: 06/07/2023 11:01       LOS: 5 days   Sarahbeth Cashin Verdene  [facility] Pager on [email or web address]  06/08/2023, 8:58 AM

## 2023-06-08 NOTE — Plan of Care (Signed)
  Problem: Activity: Goal: Risk for activity intolerance will decrease Outcome: Progressing   Problem: Coping: Goal: Level of anxiety will decrease Outcome: Progressing   Problem: Pain Management: Goal: General experience of comfort will improve Outcome: Progressing   Problem: Safety: Goal: Ability to remain free from injury will improve Outcome: Progressing   Problem: Skin Integrity: Goal: Risk for impaired skin integrity will decrease Outcome: Progressing

## 2023-06-08 NOTE — Progress Notes (Signed)
 Subjective/Chief Complaint: Comfortable this morning Denies abdominal pain   Objective: Vital signs in last 24 hours: Temp:  [97.9 F (36.6 C)-98.7 F (37.1 C)] 98.7 F (37.1 C) (01/04 0537) Pulse Rate:  [78-102] 78 (01/04 0537) Resp:  [16-29] 16 (01/03 1703) BP: (120-151)/(70-101) 130/70 (01/04 0537) SpO2:  [93 %-99 %] 93 % (01/04 0537) Last BM Date : 06/07/23  Intake/Output from previous day: 01/03 0701 - 01/04 0700 In: 69.7 [IV Piggyback:69.7] Out: 4150 [Urine:4150] Intake/Output this shift: Total I/O In: 480 [P.O.:480] Out: -   Exam: Awake and alert Follows commands Looks comfortable Abdomen soft, non-tender, no guarding Foley output looks clear  Lab Results:  Recent Labs    06/06/23 0310 06/06/23 2214 06/07/23 0305  WBC 9.9  --  7.9  HGB 7.0* 8.0* 8.3*  HCT 23.3* 25.5* 27.0*  PLT 121*  --  164   BMET Recent Labs    06/07/23 0305 06/08/23 0434  NA 138  140 139  K 4.1  4.2 4.1  CL 105  106 105  CO2 25  26 28   GLUCOSE 93  97 87  BUN 19  18 15   CREATININE 0.96  1.11 0.95  CALCIUM  8.1*  8.2* 8.3*   PT/INR No results for input(s): LABPROT, INR in the last 72 hours. ABG No results for input(s): PHART, HCO3 in the last 72 hours.  Invalid input(s): PCO2, PO2  Studies/Results: DG CHEST PORT 1 VIEW Result Date: 06/07/2023 CLINICAL DATA:  Shortness of breath. EXAM: PORTABLE CHEST 1 VIEW COMPARISON:  Chest radiographs 06/05/2023, 06/02/2023, 05/12/2023, 01/29/2016; CT chest 03/16/2008 FINDINGS: Cardiac silhouette and mediastinal contours are within normal limits for AP technique. Unchanged chronic benign calcified granuloma within the lateral right midlung. Moderately decreased lung volumes. Mild bilateral likely chronic interstitial thickening is unchanged from prior. No pleural effusion or pneumothorax. No acute skeletal abnormality. IMPRESSION: Moderately decreased lung volumes. No acute cardiopulmonary process. Electronically  Signed   By: Tanda Lyons M.D.   On: 06/07/2023 11:01    Anti-infectives: Anti-infectives (From admission, onward)    Start     Dose/Rate Route Frequency Ordered Stop   06/05/23 1400  piperacillin -tazobactam (ZOSYN ) IVPB 3.375 g        3.375 g 12.5 mL/hr over 240 Minutes Intravenous Every 8 hours 06/05/23 0957 06/10/23 1759   06/05/23 1000  amoxicillin -clavulanate (AUGMENTIN ) 875-125 MG per tablet 1 tablet  Status:  Discontinued        1 tablet Oral Every 12 hours 06/05/23 0757 06/05/23 0956   06/03/23 0945  cefTRIAXone  (ROCEPHIN ) 2 g in sodium chloride  0.9 % 100 mL IVPB  Status:  Discontinued        2 g 200 mL/hr over 30 Minutes Intravenous Every 24 hours 06/02/23 1022 06/02/23 1405   06/02/23 1600  piperacillin -tazobactam (ZOSYN ) IVPB 3.375 g  Status:  Discontinued        3.375 g 12.5 mL/hr over 240 Minutes Intravenous Every 8 hours 06/02/23 1405 06/05/23 0757   06/02/23 1130  metroNIDAZOLE  (FLAGYL ) IVPB 500 mg  Status:  Discontinued        500 mg 100 mL/hr over 60 Minutes Intravenous Every 12 hours 06/02/23 1128 06/02/23 1405   06/02/23 0945  cefTRIAXone  (ROCEPHIN ) 1 g in sodium chloride  0.9 % 100 mL IVPB  Status:  Discontinued        1 g 200 mL/hr over 30 Minutes Intravenous Every 24 hours 06/02/23 0931 06/02/23 1022   06/02/23 0545  aztreonam  (AZACTAM ) 1 g in sodium chloride   0.9 % 100 mL IVPB  Status:  Discontinued        1 g 200 mL/hr over 30 Minutes Intravenous  Once 06/02/23 0534 06/02/23 0534   06/02/23 0545  aztreonam  (AZACTAM ) 2 g in sodium chloride  0.9 % 100 mL IVPB        2 g 200 mL/hr over 30 Minutes Intravenous  Once 06/02/23 0535 06/02/23 9352       Assessment/Plan: 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  Normal WBC and abd soft  -abdomen remains benign on exam so plan to continue conservative management for now -continue soft diet  Vicenta Poli  MD 06/08/2023

## 2023-06-09 ENCOUNTER — Other Ambulatory Visit: Payer: Self-pay | Admitting: Internal Medicine

## 2023-06-09 DIAGNOSIS — B965 Pseudomonas (aeruginosa) (mallei) (pseudomallei) as the cause of diseases classified elsewhere: Secondary | ICD-10-CM

## 2023-06-09 DIAGNOSIS — D649 Anemia, unspecified: Secondary | ICD-10-CM

## 2023-06-09 DIAGNOSIS — R251 Tremor, unspecified: Secondary | ICD-10-CM

## 2023-06-09 DIAGNOSIS — N321 Vesicointestinal fistula: Secondary | ICD-10-CM | POA: Diagnosis not present

## 2023-06-09 DIAGNOSIS — N390 Urinary tract infection, site not specified: Secondary | ICD-10-CM | POA: Diagnosis not present

## 2023-06-09 LAB — CBC
HCT: 29.3 % — ABNORMAL LOW (ref 39.0–52.0)
Hemoglobin: 9 g/dL — ABNORMAL LOW (ref 13.0–17.0)
MCH: 27.7 pg (ref 26.0–34.0)
MCHC: 30.7 g/dL (ref 30.0–36.0)
MCV: 90.2 fL (ref 80.0–100.0)
Platelets: 235 10*3/uL (ref 150–400)
RBC: 3.25 MIL/uL — ABNORMAL LOW (ref 4.22–5.81)
RDW: 17.2 % — ABNORMAL HIGH (ref 11.5–15.5)
WBC: 7.9 10*3/uL (ref 4.0–10.5)
nRBC: 0 % (ref 0.0–0.2)

## 2023-06-09 LAB — URINALYSIS, ROUTINE W REFLEX MICROSCOPIC
Bilirubin Urine: NEGATIVE
Glucose, UA: NEGATIVE mg/dL
Ketones, ur: NEGATIVE mg/dL
Nitrite: NEGATIVE
Protein, ur: 100 mg/dL — AB
RBC / HPF: >50 RBC/hpf (ref 0–5)
Specific Gravity, Urine: 1.005 (ref 1.005–1.030)
WBC, UA: >50 WBC/hpf (ref 0–5)
pH: 6 (ref 5.0–8.0)

## 2023-06-09 NOTE — Plan of Care (Signed)
  Problem: Education: Goal: Knowledge of General Education information will improve Description: Including pain rating scale, medication(s)/side effects and non-pharmacologic comfort measures Outcome: Progressing   Problem: Health Behavior/Discharge Planning: Goal: Ability to manage health-related needs will improve Outcome: Progressing   Problem: Clinical Measurements: Goal: Ability to maintain clinical measurements within normal limits will improve Outcome: Progressing Goal: Will remain free from infection Outcome: Progressing Goal: Diagnostic test results will improve Outcome: Progressing Goal: Respiratory complications will improve Outcome: Progressing Goal: Cardiovascular complication will be avoided Outcome: Progressing   Problem: Elimination: Goal: Will not experience complications related to bowel motility Outcome: Progressing Goal: Will not experience complications related to urinary retention Outcome: Progressing   Problem: Activity: Goal: Risk for activity intolerance will decrease Outcome: Adequate for Discharge   Problem: Nutrition: Goal: Adequate nutrition will be maintained Outcome: Adequate for Discharge   Problem: Coping: Goal: Level of anxiety will decrease Outcome: Adequate for Discharge   Problem: Pain Management: Goal: General experience of comfort will improve Outcome: Adequate for Discharge   Problem: Safety: Goal: Ability to remain free from injury will improve Outcome: Adequate for Discharge   Problem: Skin Integrity: Goal: Risk for impaired skin integrity will decrease Outcome: Adequate for Discharge

## 2023-06-09 NOTE — Progress Notes (Addendum)
 [facility] PROGRESS NOTE   Calvin Escobar FMW:[id number] DOB: 02/04/1951 DOA: 06/02/2023  PCP: [name], MD  Brief History: PMH of HTN, HLD, BPH, HFrEF, bipolar disorder, OSA on CPAP, tremor presented to hospital with complaints of confusion and fall. 12/8 - 12/12 hospitalized for confusion and was treated for severe sepsis secondary to E. coli bacteremia from UTI-emphysematous cystitis. Has not been eating or drinking prior to admission and had worsening tremors and then had a fall and unable to get up all by himself and therefore was brought to the hospital. Currently being treated for severe sepsis from Pseudomonas UTI. Concern was also raised for colovesical fistula.  Consultants: General surgery.  Urology.  Critical care medicine  Procedures: None   Subjective/Interval History: Patient mentioned that he feels much better.  Mentions that he is now predominantly urinating through his Foley catheter rather than rectally.  Tolerating his diet.  Feels stronger.    Assessment/Plan:  Septic shock from Pseudomonas UTI Met SIRS criteria with fever, tachypnea.  Evidence of infection in the urine.  Organ damage with AKI, lactic acidosis as well as hypotension. Treated with IV fluid.  Responded well.  No evidence of shock. Had to be transferred to ICU due to shock on 1/28.  Required IV vasopressors briefly.  Blood pressure has stabilized.   Patient noted to be on midodrine .  Off of vasopressors. Seems to be stable from a hemodynamic standpoint. Urine culture is growing Pseudomonas.  Continue with Zosyn  for now.  Await final urine culture report before transitioning to oral antibiotics.  Microbiology reports reviewed.  Do not see final reports on the cultures but there are MIC reports which are not very clear to interpret.  Will request ID pharmacy to look at these tomorrow.   ADDENDUM: Patient developed hematuria today. Discussed with Dr. Renda who recommends foley  irrigation. CBC was done which showed stable hemoglobin.   Colovesical fistula. CT abdomen pelvis with and without contrast shows evidence of large amount of gas and debris's in the urinary bladder along with moderate right-sided hydronephrosis.  As well as concern for colovesical fistula. General surgery was consulted as well as urology. Continues to have a lot of urine output from his rectum as a result of the fistula. No plans for surgical intervention noted.  Conservative management will be placed for now. General surgery and urology continues to follow.  Conservative management is being pursued.  If these conservative measures fail percutaneous nephrostomy to be considered according to the notes. Recently had a cystoscopy which did not show any evidence of colovesical fistula. Prior to any colectomy patient will need to undergo colonoscopy and cardiac clearance.  Again no plans for any surgery currently so does not need GI and cardiology involvement at this time.   It appears that he has been previously seen by [facility] gastroenterology.  Will send referral to them prior to discharge so that outpatient appointment can be made.   Acute respiratory failure with hypoxia. Possible aspiration event in sleep. On 1/1 patient woke up from his nap with sudden onset of shortness of breath with upper airway wheezing.  His heart rate went up to 160s and respiratory rate was in 30s.   Patient was transferred to the ICU.  Placed on BiPAP.  He slowly improved. Respiratory status appears to be stable.  CPAP nightly.  BiPAP as needed.   Recurrent fall. Most likely syncope in the setting of sepsis and hypotension. Similar presentation recently which improved with hydration and  treatment of sepsis.   Chronic HFrEF. Echocardiogram from December 2024 showed EF to be 50 to 55%.  Echo from October 2024 showed EF to be 35 to 40%. Does not appear to be volume overloaded currently.  Continue to monitor closely.    Due to recent shock all of his blood pressure lowering agents are on hold.  Review of his home medication list suggest that he was on carvedilol  12.5 mg twice a day, losartan  50 mg daily.  OSA. On CPAP.  Will continue.   Chronic tremors. On primidone .   History of dementia. Delirium secondary to acute metabolic encephalopathy. Currently stable.   Bipolar disorder. On home regimen including Depakote , BuSpar , trazodone , Remeron  and Abilify .   HLD. On Pravachol .  Continue.   Concern for CKD-ruled out. GFR appears to be more than 60 right now. Serum creatinine 1.07. Most likely presentation actually associated with mild renal insufficiency with serum creatinine 1.25.    Obesity Class 1 Body mass index is 31.8 kg/m.  Placing the pt at higher risk of poor outcomes.    Dysphagia Speech therapy has been following.  Currently on dysphagia 3 diet.  DVT Prophylaxis: SCDs Code Status: Full code Family Communication: Discussed with patient.  Calls to his wife yesterday were not successful. Disposition Plan: Home health is recommended at discharge.  Hopefully discharge in 24 to 48 hours.     Medications: Scheduled:  ARIPiprazole   2 mg Oral Daily   busPIRone   10 mg Oral TID   Chlorhexidine  Gluconate Cloth  6 each Topical Daily   divalproex   500 mg Oral QHS   famotidine   20 mg Oral BID   feeding supplement  237 mL Oral TID BM   fluticasone   1 spray Each Nare Daily   folic acid   1 mg Oral Daily   gabapentin   300 mg Oral QHS   midodrine   5 mg Oral TID WC   mirtazapine   30 mg Oral QHS   nystatin   5 mL Oral QID   mouth rinse  15 mL Mouth Rinse 4 times per day   primidone   50 mg Oral BID   saccharomyces boulardii  250 mg Oral BID   traZODone   150 mg Oral QHS   venlafaxine  XR  300 mg Oral Q breakfast   Continuous:  piperacillin -tazobactam (ZOSYN )  IV 3.375 g (06/09/23 0921)   PRN:acetaminophen  **OR** acetaminophen , ipratropium, ketorolac , lip balm, LORazepam , morphine   injection, ondansetron  **OR** ondansetron  (ZOFRAN ) IV, mouth rinse, sodium chloride   Antibiotics: Anti-infectives (From admission, onward)    Start     Dose/Rate Route Frequency Ordered Stop   06/05/23 1400  piperacillin -tazobactam (ZOSYN ) IVPB 3.375 g        3.375 g 12.5 mL/hr over 240 Minutes Intravenous Every 8 hours 06/05/23 0957 06/10/23 1759   06/05/23 1000  amoxicillin -clavulanate (AUGMENTIN ) 875-125 MG per tablet 1 tablet  Status:  Discontinued        1 tablet Oral Every 12 hours 06/05/23 0757 06/05/23 0956   06/03/23 0945  cefTRIAXone  (ROCEPHIN ) 2 g in sodium chloride  0.9 % 100 mL IVPB  Status:  Discontinued        2 g 200 mL/hr over 30 Minutes Intravenous Every 24 hours 06/02/23 1022 06/02/23 1405   06/02/23 1600  piperacillin -tazobactam (ZOSYN ) IVPB 3.375 g  Status:  Discontinued        3.375 g 12.5 mL/hr over 240 Minutes Intravenous Every 8 hours 06/02/23 1405 06/05/23 0757   06/02/23 1130  metroNIDAZOLE  (FLAGYL ) IVPB 500 mg  Status:  Discontinued        500 mg 100 mL/hr over 60 Minutes Intravenous Every 12 hours 06/02/23 1128 06/02/23 1405   06/02/23 0945  cefTRIAXone  (ROCEPHIN ) 1 g in sodium chloride  0.9 % 100 mL IVPB  Status:  Discontinued        1 g 200 mL/hr over 30 Minutes Intravenous Every 24 hours 06/02/23 0931 06/02/23 1022   06/02/23 0545  aztreonam  (AZACTAM ) 1 g in sodium chloride  0.9 % 100 mL IVPB  Status:  Discontinued        1 g 200 mL/hr over 30 Minutes Intravenous  Once 06/02/23 0534 06/02/23 0534   06/02/23 0545  aztreonam  (AZACTAM ) 2 g in sodium chloride  0.9 % 100 mL IVPB        2 g 200 mL/hr over 30 Minutes Intravenous  Once 06/02/23 0535 06/02/23 0647       Objective:  Vital Signs  Vitals:   06/08/23 0537 06/08/23 1200 06/08/23 2014 06/09/23 0500  BP: 130/70 126/73 133/85 (!) 143/82  Pulse: 78 81 84 86  Resp:  14    Temp: 98.7 F (37.1 C) 98.1 F (36.7 C) 98 F (36.7 C) 97.8 F (36.6 C)  TempSrc: Oral Oral Oral Oral  SpO2: 93% 98% 97%  97%  Weight:      Height:        Intake/Output Summary (Last 24 hours) at 06/09/2023 1051 Last data filed at 06/09/2023 1014 Gross per 24 hour  Intake 960 ml  Output 5175 ml  Net -4215 ml   Filed Weights   06/02/23 0531 06/05/23 1313 06/07/23 0100  Weight: 109.3 kg 110.9 kg 110.9 kg    General appearance: Awake alert.  In no distress Resp: Clear to auscultation bilaterally.  Normal effort Cardio: S1-S2 is normal regular.  No S3-S4.  No rubs murmurs or bruit GI: Abdomen is soft.  Nontender nondistended.  Bowel sounds are present normal.  No masses organomegaly Extremities: No edema.  Moving all of his extremities Neurologic: Alert and oriented x3.  No focal neurological deficits.    Lab Results:  Data Reviewed: I have personally reviewed following labs and reports of the imaging studies  CBC: Recent Labs  Lab 06/04/23 0925 06/05/23 0503 06/05/23 2157 06/06/23 0310 06/06/23 2214 06/07/23 0305  WBC 5.1 6.1 13.2* 9.9  --  7.9  NEUTROABS 2.8  --   --   --   --  4.8  HGB 8.1* 8.4* 7.7* 7.0* 8.0* 8.3*  HCT 27.4* 28.2* 25.7* 23.3* 25.5* 27.0*  MCV 92.6 91.0 92.8 93.6  --  89.7  PLT 91* 126* 139* 121*  --  164    Basic Metabolic Panel: Recent Labs  Lab 06/04/23 0925 06/05/23 0503 06/06/23 0310 06/07/23 0305 06/08/23 0434  NA 136 135 135 138  140 139  K 4.0 3.5 4.0 4.1  4.2 4.1  CL 104 101 105 105  106 105  CO2 28 27 22 25  26 28   GLUCOSE 115* 106* 102* 93  97 87  BUN 15 15 17 19  18 15   CREATININE 0.94 0.98 1.33* 0.96  1.11 0.95  CALCIUM  8.0* 8.0* 7.7* 8.1*  8.2* 8.3*  MG  --  2.1 2.4 2.5* 2.5*  PHOS 3.1 2.4* 3.6  3.3 3.1 3.6    GFR: Estimated Creatinine Clearance: 89.1 mL/min (by C-G formula based on SCr of 0.95 mg/dL).  Liver Function Tests: Recent Labs  Lab 06/03/23 9567 06/04/23 9074 06/05/23 0503 06/06/23 0310 06/07/23 0305  AST 33  --   --   --  33  ALT 14  --   --   --  23  ALKPHOS 42  --   --   --  49  BILITOT 0.4  --   --   --  0.5   PROT 6.3*  --   --   --  6.5  ALBUMIN  2.6* 2.4* 2.5* 2.6* 2.7*  2.8*    Recent Results (from the past 240 hours)  Urine Culture     Status: Abnormal (Preliminary result)   Collection Time: 06/02/23  5:21 AM   Specimen: Urine, Random  Result Value Ref Range Status   Specimen Description URINE, RANDOM  Final   Special Requests NONE Reflexed from [id number]  Final   Culture (A)  Final    >=100,000 COLONIES/mL PSEUDOMONAS AERUGINOSA Sent to [facility] for further susceptibility testing. Performed at [facility], [address] [place], [place] [address]    Report Status PENDING  Incomplete  Blood Culture (routine x 2)     Status: None   Collection Time: 06/02/23  5:48 AM   Specimen: BLOOD RIGHT HAND  Result Value Ref Range Status   Specimen Description BLOOD RIGHT HAND  Final   Special Requests   Final    BOTTLES DRAWN AEROBIC AND ANAEROBIC Blood Culture adequate volume   Culture   Final    NO GROWTH 5 DAYS Performed at [facility], [address] [place], [place] [address]    Report Status 06/07/2023 FINAL  Final  Blood Culture (routine x 2)     Status: None   Collection Time: 06/02/23  5:48 AM   Specimen: BLOOD RIGHT ARM  Result Value Ref Range Status   Specimen Description BLOOD RIGHT ARM  Final   Special Requests   Final    BOTTLES DRAWN AEROBIC AND ANAEROBIC Blood Culture adequate volume   Culture   Final    NO GROWTH 5 DAYS Performed at [facility], [address], [place], [place] [address]    Report Status 06/07/2023 FINAL  Final  Resp panel by RT-PCR (RSV, Flu A&B, Covid) Anterior Nasal Swab     Status: None   Collection Time: 06/02/23  5:58 AM   Specimen: Anterior Nasal Swab  Result Value Ref Range Status   SARS Coronavirus 2 by RT PCR NEGATIVE NEGATIVE Final   Influenza A by PCR NEGATIVE NEGATIVE Final   Influenza B by PCR NEGATIVE NEGATIVE Final    Comment: (NOTE) The Xpert Xpress SARS-CoV-2/FLU/RSV plus assay is intended as an aid in the  diagnosis of influenza from Nasopharyngeal swab specimens and should not be used as a sole basis for treatment. Nasal washings and aspirates are unacceptable for Xpert Xpress SARS-CoV-2/FLU/RSV testing.  Fact Sheet for Patients: bloggercourse.com  Fact Sheet for Healthcare Providers: seriousbroker.it  This test is not yet approved or cleared by the [place]  FDA and has been authorized for detection and/or diagnosis of SARS-CoV-2 by FDA under an Emergency Use Authorization (EUA). This EUA will remain in effect (meaning this test can be used) for the duration of the COVID-19 declaration under Section 564(b)(1) of the Act, 21 U.S.C. section 360bbb-3(b)(1), unless the authorization is terminated or revoked.     Resp Syncytial Virus by PCR NEGATIVE NEGATIVE Final    Comment: (NOTE) Fact Sheet for Patients: bloggercourse.com  Fact Sheet for Healthcare Providers: seriousbroker.it  This test is not yet approved or cleared by the [place]  [place] FDA and has been authorized for detection and/or diagnosis of SARS-CoV-2 by FDA under an Emergency Use Authorization (EUA). This EUA will remain in effect (meaning this test can be used) for the duration of the COVID-19 declaration under Section 564(b)(1) of the Act, 21 U.S.C. section 360bbb-3(b)(1), unless the authorization is terminated or revoked.  Performed at [facility], [address] [place], [place] [address]   MIC (3 Drug) -Urine Culture [[id number]]; 06/02/2023; Urine, Random; pseudomonas aeruginosa; Piperacillin Nadine; Other; cefepime , ceftazidime; Other; imipenem; Patient immune status: Normal     Status: None   Collection Time: 06/05/23  7:37 AM   Specimen: Urine, Random; Blood  Result Value Ref Range Status   Min Inhibitory Conc (3 Drugs) REFERT  Corrected    Comment: (NOTE) Please refer to the following specimen  for additional lab results. Please refer to spec # [id number] for test result. Almarie Schneider notified 06-07-2023 at 17:55. Performed At: [facility] [address] [place], [place] [id number] Jennette Shorter MD Ey:[id number] CORRECTED ON 01/03 AT 1835: PREVIOUSLY REPORTED AS Preliminary report    Source Urine, Random  Final    Comment: Performed at [facility], [address] [place], [place] [address]  Result     Status: None   Collection Time: 06/05/23  7:37 AM  Result Value Ref Range Status   Result 1 Comment  Final    Comment: (NOTE) Pseudomonas aeruginosa Identification performed by account, not confirmed by this laboratory. Performed At: [facility] [address] [place], [place] [id number] Jennette Shorter MD Ey:[id number]   Susceptibility, Aer + Anaerob     Status: Abnormal (Preliminary result)   Collection Time: 06/05/23  7:37 AM  Result Value Ref Range Status   Suscept, Aer + Anaerob Preliminary report (A)  Final    Comment: (NOTE) Performed At: [facility] [address] [place], [place] [id number] Jennette Shorter MD Ey:[id number]    Source PENDING  Incomplete  Susceptibility Result     Status: Abnormal   Collection Time: 06/05/23  7:37 AM  Result Value Ref Range Status   Suscept Result 1 Comment (A)  Final    Comment: (NOTE) Pseudomonas aeruginosa Identification performed by account, not confirmed by this laboratory. Performed At: [facility] [address] [place], [place] [id number] Jennette Shorter MD Ey:[id number]   MRSA Next Gen by PCR, Nasal     Status: None   Collection Time: 06/05/23  3:15 PM   Specimen: Nasal Mucosa; Nasal Swab  Result Value Ref Range Status   MRSA by PCR Next Gen NOT DETECTED NOT DETECTED Final    Comment: (NOTE) The GeneXpert MRSA Assay (FDA approved for NASAL specimens only), is one component of a comprehensive MRSA colonization surveillance program. It is not intended  to diagnose MRSA infection nor to guide or monitor treatment for MRSA infections. Test performance is not FDA approved in patients less than 1 years old. Performed at [facility], [address] [place], [place] [address]       Radiology Studies: No results found.      LOS: 6 days   Sacheen Arrasmith [employer] on [email or web address]  06/09/2023, 10:51 AM

## 2023-06-09 NOTE — Plan of Care (Signed)
  Problem: Coping: °Goal: Level of anxiety will decrease °Outcome: Progressing °  °Problem: Elimination: °Goal: Will not experience complications related to bowel motility °Outcome: Progressing °  °Problem: Safety: °Goal: Ability to remain free from injury will improve °Outcome: Progressing °  °Problem: Skin Integrity: °Goal: Risk for impaired skin integrity will decrease °Outcome: Progressing °  °

## 2023-06-09 NOTE — Progress Notes (Signed)
 Care resumed from previous RN around 1630, agree with previous assessment. Will continue to monitor closely.

## 2023-06-09 NOTE — Progress Notes (Signed)
 Mobility Specialist - Progress Note  (RA) Pre-mobility: 80 bpm HR, 95% SpO2 During mobility: 115 bpm HR, 97% SpO2 Post-mobility: 110 bpm HR, 98% SPO2   06/09/23 1208  Mobility  Activity Ambulated with assistance in hallway  Level of Assistance Standby assist, set-up cues, supervision of patient - no hands on  Assistive Device Front wheel walker  Distance Ambulated (ft) 700 ft  Range of Motion/Exercises Active  Activity Response Tolerated well  Mobility Referral Yes  Mobility visit 1 Mobility  Mobility Specialist Start Time (ACUTE ONLY) 1155  Mobility Specialist Stop Time (ACUTE ONLY) 1208  Mobility Specialist Time Calculation (min) (ACUTE ONLY) 13 min   Pt was found in bed and eager to get up to ambulate. Pt had no complaints with session. At EOS returned to recliner chair with all needs met. Call bell in reach and chair alarm on.   Erminio Leos Mobility Specialist

## 2023-06-09 NOTE — Progress Notes (Signed)
 Subjective/Chief Complaint: No acute changes over night Denies abdominal pain   Objective: Vital signs in last 24 hours: Temp:  [97.8 F (36.6 C)-98.1 F (36.7 C)] 97.8 F (36.6 C) (01/05 0500) Pulse Rate:  [81-86] 86 (01/05 0500) Resp:  [14] 14 (01/04 1200) BP: (126-143)/(73-85) 143/82 (01/05 0500) SpO2:  [97 %-98 %] 97 % (01/05 0500) Last BM Date : 06/07/23  Intake/Output from previous day: 01/04 0701 - 01/05 0700 In: 1440 [P.O.:1440] Out: 4775 [Urine:4775] Intake/Output this shift: No intake/output data recorded.   Exam: Awake and alert Abdomen soft, NT/ND Urine clear, no gross stool  Lab Results:  Recent Labs    06/06/23 2214 06/07/23 0305  WBC  --  7.9  HGB 8.0* 8.3*  HCT 25.5* 27.0*  PLT  --  164   BMET Recent Labs    06/07/23 0305 06/08/23 0434  NA 138  140 139  K 4.1  4.2 4.1  CL 105  106 105  CO2 25  26 28   GLUCOSE 93  97 87  BUN 19  18 15   CREATININE 0.96  1.11 0.95  CALCIUM  8.1*  8.2* 8.3*   PT/INR No results for input(s): LABPROT, INR in the last 72 hours. ABG No results for input(s): PHART, HCO3 in the last 72 hours.  Invalid input(s): PCO2, PO2  Studies/Results: DG CHEST PORT 1 VIEW Result Date: 06/07/2023 CLINICAL DATA:  Shortness of breath. EXAM: PORTABLE CHEST 1 VIEW COMPARISON:  Chest radiographs 06/05/2023, 06/02/2023, 05/12/2023, 01/29/2016; CT chest 03/16/2008 FINDINGS: Cardiac silhouette and mediastinal contours are within normal limits for AP technique. Unchanged chronic benign calcified granuloma within the lateral right midlung. Moderately decreased lung volumes. Mild bilateral likely chronic interstitial thickening is unchanged from prior. No pleural effusion or pneumothorax. No acute skeletal abnormality. IMPRESSION: Moderately decreased lung volumes. No acute cardiopulmonary process. Electronically Signed   By: Tanda Lyons M.D.   On: 06/07/2023 11:01    Anti-infectives: Anti-infectives (From  admission, onward)    Start     Dose/Rate Route Frequency Ordered Stop   06/05/23 1400  piperacillin -tazobactam (ZOSYN ) IVPB 3.375 g        3.375 g 12.5 mL/hr over 240 Minutes Intravenous Every 8 hours 06/05/23 0957 06/10/23 1759   06/05/23 1000  amoxicillin -clavulanate (AUGMENTIN ) 875-125 MG per tablet 1 tablet  Status:  Discontinued        1 tablet Oral Every 12 hours 06/05/23 0757 06/05/23 0956   06/03/23 0945  cefTRIAXone  (ROCEPHIN ) 2 g in sodium chloride  0.9 % 100 mL IVPB  Status:  Discontinued        2 g 200 mL/hr over 30 Minutes Intravenous Every 24 hours 06/02/23 1022 06/02/23 1405   06/02/23 1600  piperacillin -tazobactam (ZOSYN ) IVPB 3.375 g  Status:  Discontinued        3.375 g 12.5 mL/hr over 240 Minutes Intravenous Every 8 hours 06/02/23 1405 06/05/23 0757   06/02/23 1130  metroNIDAZOLE  (FLAGYL ) IVPB 500 mg  Status:  Discontinued        500 mg 100 mL/hr over 60 Minutes Intravenous Every 12 hours 06/02/23 1128 06/02/23 1405   06/02/23 0945  cefTRIAXone  (ROCEPHIN ) 1 g in sodium chloride  0.9 % 100 mL IVPB  Status:  Discontinued        1 g 200 mL/hr over 30 Minutes Intravenous Every 24 hours 06/02/23 0931 06/02/23 1022   06/02/23 0545  aztreonam  (AZACTAM ) 1 g in sodium chloride  0.9 % 100 mL IVPB  Status:  Discontinued  1 g 200 mL/hr over 30 Minutes Intravenous  Once 06/02/23 0534 06/02/23 0534   06/02/23 0545  aztreonam  (AZACTAM ) 2 g in sodium chloride  0.9 % 100 mL IVPB        2 g 200 mL/hr over 30 Minutes Intravenous  Once 06/02/23 0535 06/02/23 0647       Assessment/Plan: 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  Normal WBC and abd soft  -he continues to improve so will continue conservative management  Vicenta Poli 06/09/2023

## 2023-06-10 ENCOUNTER — Ambulatory Visit: Payer: Medicare HMO | Admitting: Internal Medicine

## 2023-06-10 DIAGNOSIS — D649 Anemia, unspecified: Secondary | ICD-10-CM | POA: Diagnosis not present

## 2023-06-10 DIAGNOSIS — N321 Vesicointestinal fistula: Secondary | ICD-10-CM | POA: Diagnosis not present

## 2023-06-10 DIAGNOSIS — B965 Pseudomonas (aeruginosa) (mallei) (pseudomallei) as the cause of diseases classified elsewhere: Secondary | ICD-10-CM | POA: Diagnosis not present

## 2023-06-10 DIAGNOSIS — N390 Urinary tract infection, site not specified: Secondary | ICD-10-CM | POA: Diagnosis not present

## 2023-06-10 LAB — BASIC METABOLIC PANEL
Anion gap: 9 (ref 5–15)
BUN: 13 mg/dL (ref 8–23)
CO2: 27 mmol/L (ref 22–32)
Calcium: 8.4 mg/dL — ABNORMAL LOW (ref 8.9–10.3)
Chloride: 103 mmol/L (ref 98–111)
Creatinine, Ser: 1.13 mg/dL (ref 0.61–1.24)
GFR, Estimated: >60 mL/min (ref >60)
Glucose, Bld: 85 mg/dL (ref 70–99)
Potassium: 4.3 mmol/L (ref 3.5–5.1)
Sodium: 139 mmol/L (ref 135–145)

## 2023-06-10 LAB — CBC
HCT: 28.5 % — ABNORMAL LOW (ref 39.0–52.0)
Hemoglobin: 8.4 g/dL — ABNORMAL LOW (ref 13.0–17.0)
MCH: 26.9 pg (ref 26.0–34.0)
MCHC: 29.5 g/dL — ABNORMAL LOW (ref 30.0–36.0)
MCV: 91.3 fL (ref 80.0–100.0)
Platelets: 254 10*3/uL (ref 150–400)
RBC: 3.12 MIL/uL — ABNORMAL LOW (ref 4.22–5.81)
RDW: 17 % — ABNORMAL HIGH (ref 11.5–15.5)
WBC: 9.2 10*3/uL (ref 4.0–10.5)
nRBC: 0 % (ref 0.0–0.2)

## 2023-06-10 NOTE — Progress Notes (Signed)
 Progress Note     Subjective: Pt denies abdominal pain, nausea or vomiting. Foley draining and no urine per rectum. He is anxious to get home.   Objective: Vital signs in last 24 hours: Temp:  [97.3 F (36.3 C)-98.6 F (37 C)] 98.2 F (36.8 C) (01/06 0505) Pulse Rate:  [79-86] 83 (01/06 0505) Resp:  [12-19] 12 (01/05 2102) BP: (131-154)/(71-87) 131/83 (01/06 0505) SpO2:  [95 %-98 %] 95 % (01/06 0505) Last BM Date : 06/09/23  Intake/Output from previous day: 01/05 0701 - 01/06 0700 In: 1320 [P.O.:1320] Out: 5625 [Urine:5625] Intake/Output this shift: Total I/O In: 360 [P.O.:360] Out: 800 [Urine:800]  PE: General: pleasant, WD, obese male who is up in chair in NAD Lungs: Respiratory effort nonlabored Abd: soft, NT, ND GU: foley present with clear urine      Lab Results:  Recent Labs    06/09/23 1525 06/10/23 0358  WBC 7.9 9.2  HGB 9.0* 8.4*  HCT 29.3* 28.5*  PLT 235 254   BMET Recent Labs    06/08/23 0434 06/10/23 0358  NA 139 139  K 4.1 4.3  CL 105 103  CO2 28 27  GLUCOSE 87 85  BUN 15 13  CREATININE 0.95 1.13  CALCIUM  8.3* 8.4*   PT/INR No results for input(s): LABPROT, INR in the last 72 hours. CMP     Component Value Date/Time   NA 139 06/10/2023 0358   NA 143 04/14/2021 1125   K 4.3 06/10/2023 0358   CL 103 06/10/2023 0358   CO2 27 06/10/2023 0358   GLUCOSE 85 06/10/2023 0358   BUN 13 06/10/2023 0358   BUN 21 04/14/2021 1125   CREATININE 1.13 06/10/2023 0358   CALCIUM  8.4 (L) 06/10/2023 0358   PROT 6.5 06/07/2023 0305   PROT 7.0 10/28/2019 1629   ALBUMIN  2.7 (L) 06/07/2023 0305   ALBUMIN  2.8 (L) 06/07/2023 0305   ALBUMIN  4.2 10/28/2019 1629   AST 33 06/07/2023 0305   ALT 23 06/07/2023 0305   ALKPHOS 49 06/07/2023 0305   BILITOT 0.5 06/07/2023 0305   BILITOT 0.4 10/28/2019 1629   GFRNONAA >60 06/10/2023 0358   GFRAA 67 11/13/2019 0000   Lipase     Component Value Date/Time   LIPASE 15.0 01/15/2023 1243        Studies/Results: No results found.  Anti-infectives: Anti-infectives (From admission, onward)    Start     Dose/Rate Route Frequency Ordered Stop   06/05/23 1400  piperacillin -tazobactam (ZOSYN ) IVPB 3.375 g        3.375 g 12.5 mL/hr over 240 Minutes Intravenous Every 8 hours 06/05/23 0957 06/10/23 1759   06/05/23 1000  amoxicillin -clavulanate (AUGMENTIN ) 875-125 MG per tablet 1 tablet  Status:  Discontinued        1 tablet Oral Every 12 hours 06/05/23 0757 06/05/23 0956   06/03/23 0945  cefTRIAXone  (ROCEPHIN ) 2 g in sodium chloride  0.9 % 100 mL IVPB  Status:  Discontinued        2 g 200 mL/hr over 30 Minutes Intravenous Every 24 hours 06/02/23 1022 06/02/23 1405   06/02/23 1600  piperacillin -tazobactam (ZOSYN ) IVPB 3.375 g  Status:  Discontinued        3.375 g 12.5 mL/hr over 240 Minutes Intravenous Every 8 hours 06/02/23 1405 06/05/23 0757   06/02/23 1130  metroNIDAZOLE  (FLAGYL ) IVPB 500 mg  Status:  Discontinued        500 mg 100 mL/hr over 60 Minutes Intravenous Every 12 hours 06/02/23 1128 06/02/23 1405  06/02/23 0945  cefTRIAXone  (ROCEPHIN ) 1 g in sodium chloride  0.9 % 100 mL IVPB  Status:  Discontinued        1 g 200 mL/hr over 30 Minutes Intravenous Every 24 hours 06/02/23 0931 06/02/23 1022   06/02/23 0545  aztreonam  (AZACTAM ) 1 g in sodium chloride  0.9 % 100 mL IVPB  Status:  Discontinued        1 g 200 mL/hr over 30 Minutes Intravenous  Once 06/02/23 0534 06/02/23 0534   06/02/23 0545  aztreonam  (AZACTAM ) 2 g in sodium chloride  0.9 % 100 mL IVPB        2 g 200 mL/hr over 30 Minutes Intravenous  Once 06/02/23 0535 06/02/23 0647        Assessment/Plan  Colovesical fistula with urosepsis  - CT 12/29 with large amount gas and debris in urinary bladder with gas in collecting systems of both kidneys, extensive diverticulosis of colon without diverticulitis  - improving clinically and no leukocytosis  - foley per urology, draining well and no further urine per  rectum - stable for DC from surgical standpoint, defer abx duration to urology - patient will need outpatient cardiac clearance and colonoscopy prior to consideration of surgery   FEN: D3 diet VTE: SCDs ID: last dose zosyn  in progress  - per TRH -  Acute respiratory failure with hypoxia ?aspiration Dysphagia  Recurrent fall Chronic HFrEF Normocytic anemia  OSA Chronic tremors Delirium/?mild dementia  Bipolar disorder HLD Obesity Class I   LOS: 7 days   I reviewed Consultant urology notes, hospitalist notes, last 24 h vitals and pain scores, last 48 h intake and output, last 24 h labs and trends, and last 24 h imaging results.    Burnard JONELLE Louder, [facility] 06/10/2023, 10:36 AM Please see Amion for pager number during day hours 7:00am-4:30pm

## 2023-06-10 NOTE — Progress Notes (Addendum)
     Subjective: Calvin Escobar. Pt reports the passage of urine per rectum had been resolved for the last couple of days. In good spirits.    Objective: Vital signs in last 24 hours: Temp:  [97.3 F (36.3 C)-98.6 F (37 C)] 98.2 F (36.8 C) (01/06 0505) Pulse Rate:  [79-86] 83 (01/06 0505) Resp:  [12-19] 12 (01/05 2102) BP: (131-154)/(71-87) 131/83 (01/06 0505) SpO2:  [95 %-98 %] 95 % (01/06 0505)  Assessment/Plan: #colovesical fistula #UTI Plan per colorectal surgery. Awaiting colonoscopy. Plans to optimize pt medically and unlikely to have surgery during this admission  Trend labs. Normothermic. Normal WBC without signs of systemic infection.   Foley upsized 1/3. Draining appropriately over the weekend with no further obstruction. Urine output per rectum has subsided. Excellent UOP. Nursing to hand irrigate as needed. PCNT should not be needed.   Hematuria reported per hospitalist. Clear yellow on rounds today.   UA looks surprisingly unremarkable considering fistula. ABX per primary.  Pt to discharge with foley and plan for monthly exchanges. These can be scheduled at our office or any outpt facility. Foley needs to stay the 33f that is presently in place or larger.  Urology will follow along peripherally  Intake/Output from previous day: 01/05 0701 - 01/06 0700 In: 1320 [P.O.:1320] Out: 5625 [Urine:5625]  Intake/Output this shift: No intake/output data recorded.  Physical Exam:  General: Alert and oriented CV: No cyanosis Lungs: equal chest rise Abdomen: Soft, NTND, no rebound or guarding Gu: foley in place draining clear yellow urine.  Lab Results: Recent Labs    06/09/23 1525 06/10/23 0358  HGB 9.0* 8.4*  HCT 29.3* 28.5*   BMET Recent Labs    06/08/23 0434 06/10/23 0358  NA 139 139  K 4.1 4.3  CL 105 103  CO2 28 27  GLUCOSE 87 85  BUN 15 13  CREATININE 0.95 1.13  CALCIUM  8.3* 8.4*     Studies/Results: No results found.     LOS: 7 days    Ole Bourdon, NP [facility] Pager: [phone]  06/10/2023, 7:52 AM

## 2023-06-10 NOTE — Procedures (Signed)
..    Urology Procedure Note:   Returned to bedside to upsize foley catheter d/t likely obstruction.  Reviewed with Norleen that his small bore Foley catheter was becoming clogged with feculent material, causing urinary obstruction, which was in turn sharply increasing his output of urine per rectum.  Shared decision was made to proceed with exchanging for a larger size Foley catheter in an effort to avoid rectal Foley placement or percutaneous nephrostomy tubes.  10 cc retention balloon was deflated and existing Foley catheter was removed without difficulty.  At this point patient was prepped and draped in the usual sterile fashion.  10 cc of sterile lubricant was injected directly into the urethra.  Next a 32f Foley catheter was placed with immediate return of cloudy yellow urine.  With placement confirmed, the retention balloon was inflated with 30cc of sterile water .  Foley was placed to gravity drainage with no dependent loops.  Approximately 800cc of urine was returned.  Foley catheter to stay in place with exchanges every 30 days.  Nursing to hand irrigate as needed.  Ole Bourdon, NP [facility] Pager: [phone]

## 2023-06-10 NOTE — Progress Notes (Signed)
 Mobility Specialist - Progress Note  During mobility: 126 bpm HR, Post-mobility: 92 bpm HR,    06/10/23 0911  Mobility  Activity Ambulated with assistance in hallway  Level of Assistance Standby assist, set-up cues, supervision of patient - no hands on  Assistive Device Front wheel walker  Distance Ambulated (ft) 700 ft  Range of Motion/Exercises Active  Activity Response Tolerated well  Mobility Referral Yes  Mobility visit 1 Mobility  Mobility Specialist Start Time (ACUTE ONLY) [id number]  Mobility Specialist Stop Time (ACUTE ONLY) 0911  Mobility Specialist Time Calculation (min) (ACUTE ONLY) 13 min   Pt was found in bed and agreeable to ambulate. No complaints with session. At EOS returned to recliner chair with all needs met. Call bell in reach and chair alarm on.  Erminio Leos Mobility Specialist

## 2023-06-10 NOTE — Progress Notes (Signed)
 Mobility Specialist - Progress Note   06/10/23 1411  Mobility  Activity Ambulated with assistance in hallway  Level of Assistance Standby assist, set-up cues, supervision of patient - no hands on  Assistive Device Front wheel walker  Distance Ambulated (ft) 700 ft  Range of Motion/Exercises Active  Activity Response Tolerated well  Mobility Referral Yes  Mobility visit 1 Mobility  Mobility Specialist Start Time (ACUTE ONLY) 1400  Mobility Specialist Stop Time (ACUTE ONLY) 1411  Mobility Specialist Time Calculation (min) (ACUTE ONLY) 11 min   Pt was found on recliner chair and agreeable to ambulate. No complaints with session. At EOS returned to recliner chair with all needs met. Call bell in reach and NT in room.  Erminio Leos Mobility Specialist

## 2023-06-10 NOTE — Consult Note (Addendum)
 [facility] Liaison Consult Note    06/10/2023  Calvin Escobar 1951-05-03 [id number]  [facility] Patient: Active with [facility] LCSW prior to admission  Primary Care Provider:  [name], MD with [place] [facility], this provider is listed to provide the community transition of care follow up and community team for [facility] calls  Insurance: [facility]   Patient is currently active with [facility] for care coordination services.  Patient has been engaged by a Tourist Information Centre Manager.  The community based plan of care has focused on disease management and community resource support.  Noted that care coordination for mental health support and transportation to appointments.  Patient will receive a post hospital call and will be evaluated for assessments and disease process education.    Plan: Continue to follow for any additional community care coordination needs for post hospital/community needs. Patient to have follow up with [facility] TOC team and alerted [facility] LCSW of home with [facility] currently, planned.    Of note, [facility] services does not replace or interfere with any services that are needed or arranged by inpatient [facility] care management team.   Richerd Fish, RN, BSN, CCM [place]  [facility], [facility] Liaison Direct Dial: [phone] or secure chat Email: [email or web address]

## 2023-06-10 NOTE — Progress Notes (Signed)
 [facility] PROGRESS NOTE   Calvin Escobar FMW:[id number] DOB: February 16, 1951 DOA: 06/02/2023  PCP: [name], MD  Brief History: PMH of HTN, HLD, BPH, HFrEF, bipolar disorder, OSA on CPAP, tremor presented to hospital with complaints of confusion and fall. 12/8 - 12/12 hospitalized for confusion and was treated for severe sepsis secondary to E. coli bacteremia from UTI-emphysematous cystitis. Has not been eating or drinking prior to admission and had worsening tremors and then had a fall and unable to get up all by himself and therefore was brought to the hospital. Currently being treated for severe sepsis from Pseudomonas UTI. Concern was also raised for colovesical fistula.  Consultants: General surgery.  Urology.  Critical care medicine  Procedures: None   Subjective/Interval History: Patient mentions that he feels well.  Denies any abdominal pain nausea vomiting.  Tolerating his diet.  Able to ambulate.    Assessment/Plan:  Septic shock from Pseudomonas UTI Met SIRS criteria with fever, tachypnea.  Evidence of infection in the urine.  Organ damage with AKI, lactic acidosis as well as hypotension. Treated with IV fluid.  Responded well.  No evidence of shock. Had to be transferred to ICU due to shock on 1/28.  Required IV vasopressors briefly.  Blood pressure has stabilized.   Patient noted to be on midodrine .  Off of vasopressors. Seems to be stable from a hemodynamic standpoint. Urine culture is growing Pseudomonas.  Continue with Zosyn  for now.  Await final urine culture report before transitioning to oral antibiotics.  Microbiology reports reviewed.  Do not see final reports on the cultures but there are MIC reports which are not very clear to interpret.  Will request ID pharmacy to review. Patient has completed 7 days of treatment.  Anticipate he will need only 2 to 3 days more of antibiotics.    Hematuria Patient had hematuria yesterday afternoon.  After  discussions with urology irrigation was performed.  Urine noted to be clear yellow this morning.  Hemoglobin stable for the most part.  Will recheck labs tomorrow morning.  Colovesical fistula. CT abdomen pelvis with and without contrast shows evidence of large amount of gas and debris's in the urinary bladder along with moderate right-sided hydronephrosis.  As well as concern for colovesical fistula. General surgery was consulted as well as urology. Continues to have a lot of urine output from his rectum as a result of the fistula. No plans for surgical intervention noted.  Conservative management will be placed for now. General surgery and urology continues to follow.  Conservative management is being pursued.  If these conservative measures fail percutaneous nephrostomy to be considered according to the notes. Recently had a cystoscopy which did not show any evidence of colovesical fistula. Prior to any colectomy patient will need to undergo colonoscopy and cardiac clearance.  Again no plans for any surgery currently so does not need for GI and cardiology involvement at this time.   It appears that he has been previously seen by [facility] gastroenterology.  Will send referral to them prior to discharge so that outpatient appointment can be made.   Acute respiratory failure with hypoxia. Possible aspiration event in sleep. On 1/1 patient woke up from his nap with sudden onset of shortness of breath with upper airway wheezing.  His heart rate went up to 160s and respiratory rate was in 30s.   Patient was transferred to the ICU.  Placed on BiPAP.  He slowly improved. Respiratory status appears to be stable.  CPAP nightly.  Recurrent fall. Most likely syncope in the setting of sepsis and hypotension. Similar presentation recently which improved with hydration and treatment of sepsis.   Chronic HFrEF. Echocardiogram from December 2024 showed EF to be 50 to 55%.  Echo from October 2024 showed EF  to be 35 to 40%. Does not appear to be volume overloaded currently.  Continue to monitor closely.   Due to recent shock all of his blood pressure lowering agents are on hold.  Review of his home medication list suggest that he was on carvedilol  12.5 mg twice a day, losartan  50 mg daily.  Should be able to resume his carvedilol  at discharge.  Normocytic anemia Stable for the most part.  OSA. On CPAP.  Will continue.   Chronic tremors. On primidone .   History of dementia. Delirium secondary to acute metabolic encephalopathy. Currently stable.   Bipolar disorder. On home regimen including Depakote , BuSpar , trazodone , Remeron  and Abilify .   HLD. On Pravachol .  Continue.   Concern for CKD-ruled out. GFR appears to be more than 60 right now. Serum creatinine 1.07. Most likely presentation actually associated with mild renal insufficiency with serum creatinine 1.25.    Obesity Class 1 Body mass index is 31.8 kg/m.  Placing the pt at higher risk of poor outcomes.    Dysphagia Speech therapy has been following.  Currently on dysphagia 3 diet.  DVT Prophylaxis: SCDs Code Status: Full code Family Communication: Discussed with patient.  Calls to his wife were not successful. Disposition Plan: Home health is recommended at discharge.  If no further recurrence of hematuria and if his hemoglobin remains stable anticipate that he will be able to discharge 1/7.     Medications: Scheduled:  ARIPiprazole   2 mg Oral Daily   busPIRone   10 mg Oral TID   Chlorhexidine  Gluconate Cloth  6 each Topical Daily   divalproex   500 mg Oral QHS   famotidine   20 mg Oral BID   feeding supplement  237 mL Oral TID BM   fluticasone   1 spray Each Nare Daily   folic acid   1 mg Oral Daily   gabapentin   300 mg Oral QHS   midodrine   5 mg Oral TID WC   mirtazapine   30 mg Oral QHS   nystatin   5 mL Oral QID   mouth rinse  15 mL Mouth Rinse 4 times per day   primidone   50 mg Oral BID   saccharomyces  boulardii  250 mg Oral BID   traZODone   150 mg Oral QHS   venlafaxine  XR  300 mg Oral Q breakfast   Continuous:  piperacillin -tazobactam (ZOSYN )  IV 3.375 g (06/10/23 0831)   PRN:acetaminophen  **OR** acetaminophen , ipratropium, lip balm, LORazepam , morphine  injection, ondansetron  **OR** ondansetron  (ZOFRAN ) IV, mouth rinse, sodium chloride   Antibiotics: Anti-infectives (From admission, onward)    Start     Dose/Rate Route Frequency Ordered Stop   06/05/23 1400  piperacillin -tazobactam (ZOSYN ) IVPB 3.375 g        3.375 g 12.5 mL/hr over 240 Minutes Intravenous Every 8 hours 06/05/23 0957 06/10/23 1759   06/05/23 1000  amoxicillin -clavulanate (AUGMENTIN ) 875-125 MG per tablet 1 tablet  Status:  Discontinued        1 tablet Oral Every 12 hours 06/05/23 0757 06/05/23 0956   06/03/23 0945  cefTRIAXone  (ROCEPHIN ) 2 g in sodium chloride  0.9 % 100 mL IVPB  Status:  Discontinued        2 g 200 mL/hr over 30 Minutes Intravenous Every 24 hours 06/02/23  1022 06/02/23 1405   06/02/23 1600  piperacillin -tazobactam (ZOSYN ) IVPB 3.375 g  Status:  Discontinued        3.375 g 12.5 mL/hr over 240 Minutes Intravenous Every 8 hours 06/02/23 1405 06/05/23 0757   06/02/23 1130  metroNIDAZOLE  (FLAGYL ) IVPB 500 mg  Status:  Discontinued        500 mg 100 mL/hr over 60 Minutes Intravenous Every 12 hours 06/02/23 1128 06/02/23 1405   06/02/23 0945  cefTRIAXone  (ROCEPHIN ) 1 g in sodium chloride  0.9 % 100 mL IVPB  Status:  Discontinued        1 g 200 mL/hr over 30 Minutes Intravenous Every 24 hours 06/02/23 0931 06/02/23 1022   06/02/23 0545  aztreonam  (AZACTAM ) 1 g in sodium chloride  0.9 % 100 mL IVPB  Status:  Discontinued        1 g 200 mL/hr over 30 Minutes Intravenous  Once 06/02/23 0534 06/02/23 0534   06/02/23 0545  aztreonam  (AZACTAM ) 2 g in sodium chloride  0.9 % 100 mL IVPB        2 g 200 mL/hr over 30 Minutes Intravenous  Once 06/02/23 0535 06/02/23 0647       Objective:  Vital  Signs  Vitals:   06/09/23 1309 06/09/23 1409 06/09/23 2102 06/10/23 0505  BP: 132/71 131/85 (!) 154/87 131/83  Pulse: 86 86 79 83  Resp: 16 19 12    Temp: 98.6 F (37 C) 97.7 F (36.5 C) (!) 97.3 F (36.3 C) 98.2 F (36.8 C)  TempSrc: Oral Oral Oral Oral  SpO2: 97% 98% 97% 95%  Weight:      Height:        Intake/Output Summary (Last 24 hours) at 06/10/2023 1036 Last data filed at 06/10/2023 0953 Gross per 24 hour  Intake 1320 ml  Output 5025 ml  Net -3705 ml   Filed Weights   06/02/23 0531 06/05/23 1313 06/07/23 0100  Weight: 109.3 kg 110.9 kg 110.9 kg    General appearance: Awake alert.  In no distress Resp: Clear to auscultation bilaterally.  Normal effort Cardio: S1-S2 is normal regular.  No S3-S4.  No rubs murmurs or bruit GI: Abdomen is soft.  Nontender nondistended.  Bowel sounds are present normal.  No masses organomegaly Extremities: No edema.  Full range of motion of lower extremities. Neurologic: Alert and oriented x3.  No focal neurological deficits.    Lab Results:  Data Reviewed: I have personally reviewed following labs and reports of the imaging studies  CBC: Recent Labs  Lab 06/04/23 0925 06/05/23 0503 06/05/23 2157 06/06/23 0310 06/06/23 2214 06/07/23 0305 06/09/23 1525 06/10/23 0358  WBC 5.1   < > 13.2* 9.9  --  7.9 7.9 9.2  NEUTROABS 2.8  --   --   --   --  4.8  --   --   HGB 8.1*   < > 7.7* 7.0* 8.0* 8.3* 9.0* 8.4*  HCT 27.4*   < > 25.7* 23.3* 25.5* 27.0* 29.3* 28.5*  MCV 92.6   < > 92.8 93.6  --  89.7 90.2 91.3  PLT 91*   < > 139* 121*  --  164 235 254   < > = values in this interval not displayed.    Basic Metabolic Panel: Recent Labs  Lab 06/04/23 0925 06/05/23 0503 06/06/23 0310 06/07/23 0305 06/08/23 0434 06/10/23 0358  NA 136 135 135 138  140 139 139  K 4.0 3.5 4.0 4.1  4.2 4.1 4.3  CL 104 101 105  105  106 105 103  CO2 28 27 22 25  26 28 27   GLUCOSE 115* 106* 102* 93  97 87 85  BUN 15 15 17 19  18 15 13    CREATININE 0.94 0.98 1.33* 0.96  1.11 0.95 1.13  CALCIUM  8.0* 8.0* 7.7* 8.1*  8.2* 8.3* 8.4*  MG  --  2.1 2.4 2.5* 2.5*  --   PHOS 3.1 2.4* 3.6  3.3 3.1 3.6  --     GFR: Estimated Creatinine Clearance: 74.9 mL/min (by C-G formula based on SCr of 1.13 mg/dL).  Liver Function Tests: Recent Labs  Lab 06/04/23 0925 06/05/23 0503 06/06/23 0310 06/07/23 0305  AST  --   --   --  33  ALT  --   --   --  23  ALKPHOS  --   --   --  49  BILITOT  --   --   --  0.5  PROT  --   --   --  6.5  ALBUMIN  2.4* 2.5* 2.6* 2.7*  2.8*    Recent Results (from the past 240 hours)  Urine Culture     Status: Abnormal (Preliminary result)   Collection Time: 06/02/23  5:21 AM   Specimen: Urine, Random  Result Value Ref Range Status   Specimen Description URINE, RANDOM  Final   Special Requests NONE Reflexed from [id number]  Final   Culture (A)  Final    >=100,000 COLONIES/mL PSEUDOMONAS AERUGINOSA Sent to [facility] for further susceptibility testing. Performed at [facility], [address] [place], [place] [address]    Report Status PENDING  Incomplete  Blood Culture (routine x 2)     Status: None   Collection Time: 06/02/23  5:48 AM   Specimen: BLOOD RIGHT HAND  Result Value Ref Range Status   Specimen Description BLOOD RIGHT HAND  Final   Special Requests   Final    BOTTLES DRAWN AEROBIC AND ANAEROBIC Blood Culture adequate volume   Culture   Final    NO GROWTH 5 DAYS Performed at [facility], [address], [place], [place] [address]    Report Status 06/07/2023 FINAL  Final  Blood Culture (routine x 2)     Status: None   Collection Time: 06/02/23  5:48 AM   Specimen: BLOOD RIGHT ARM  Result Value Ref Range Status   Specimen Description BLOOD RIGHT ARM  Final   Special Requests   Final    BOTTLES DRAWN AEROBIC AND ANAEROBIC Blood Culture adequate volume   Culture   Final    NO GROWTH 5 DAYS Performed at [facility], [address], [place], [place] [address]     Report Status 06/07/2023 FINAL  Final  Resp panel by RT-PCR (RSV, Flu A&B, Covid) Anterior Nasal Swab     Status: None   Collection Time: 06/02/23  5:58 AM   Specimen: Anterior Nasal Swab  Result Value Ref Range Status   SARS Coronavirus 2 by RT PCR NEGATIVE NEGATIVE Final   Influenza A by PCR NEGATIVE NEGATIVE Final   Influenza B by PCR NEGATIVE NEGATIVE Final    Comment: (NOTE) The Xpert Xpress SARS-CoV-2/FLU/RSV plus assay is intended as an aid in the diagnosis of influenza from Nasopharyngeal swab specimens and should not be used as a sole basis for treatment. Nasal washings and aspirates are unacceptable for Xpert Xpress SARS-CoV-2/FLU/RSV testing.  Fact Sheet for Patients: bloggercourse.com  Fact Sheet for Healthcare Providers: seriousbroker.it  This test is not yet approved or cleared by the [place]  FDA and has been authorized for detection and/or diagnosis of SARS-CoV-2 by FDA under an Emergency Use Authorization (EUA). This EUA will remain in effect (meaning this test can be used) for the duration of the COVID-19 declaration under Section 564(b)(1) of the Act, 21 U.S.C. section 360bbb-3(b)(1), unless the authorization is terminated or revoked.     Resp Syncytial Virus by PCR NEGATIVE NEGATIVE Final    Comment: (NOTE) Fact Sheet for Patients: bloggercourse.com  Fact Sheet for Healthcare Providers: seriousbroker.it  This test is not yet approved or cleared by the [place]  FDA and has been authorized for detection and/or diagnosis of SARS-CoV-2 by FDA under an Emergency Use Authorization (EUA). This EUA will remain in effect (meaning this test can be used) for the duration of the COVID-19 declaration under Section 564(b)(1) of the Act, 21 U.S.C. section 360bbb-3(b)(1), unless the authorization is terminated or revoked.  Performed at [facility],  [address] [place], [place] [address]   MIC (3 Drug) -Urine Culture [[id number]]; 06/02/2023; Urine, Random; pseudomonas aeruginosa; Piperacillin Nadine; Other; cefepime , ceftazidime; Other; imipenem; Patient immune status: Normal     Status: None   Collection Time: 06/05/23  7:37 AM   Specimen: Urine, Random; Blood  Result Value Ref Range Status   Min Inhibitory Conc (3 Drugs) REFERT  Corrected    Comment: (NOTE) Please refer to the following specimen for additional lab results. Please refer to spec # [phone] for test result. Almarie Schneider notified 06-07-2023 at 17:55. Performed At: [facility] [address] [place], [place] [id number] Jennette Shorter MD Ey:[id number] CORRECTED ON 01/03 AT 1835: PREVIOUSLY REPORTED AS Preliminary report    Source Urine, Random  Final    Comment: Performed at [facility], [address] [place], [place] [address]  Result     Status: None   Collection Time: 06/05/23  7:37 AM  Result Value Ref Range Status   Result 1 Comment  Final    Comment: (NOTE) Pseudomonas aeruginosa Identification performed by account, not confirmed by this laboratory. Performed At: [facility] [address] [place], [place] [id number] Jennette Shorter MD Ey:[id number]   Susceptibility, Aer + Anaerob     Status: Abnormal (Preliminary result)   Collection Time: 06/05/23  7:37 AM  Result Value Ref Range Status   Suscept, Aer + Anaerob Preliminary report (A)  Final    Comment: (NOTE) Performed At: [facility] [address] [place], [place] [id number] Jennette Shorter MD Ey:[id number]    Source PENDING  Incomplete  Susceptibility Result     Status: Abnormal   Collection Time: 06/05/23  7:37 AM  Result Value Ref Range Status   Suscept Result 1 Comment (A)  Final    Comment: (NOTE) Pseudomonas aeruginosa Identification performed by account, not confirmed by this laboratory. Performed At: [facility] [address] [place], [place] [id number] Jennette Shorter MD Ey:[id number]   MRSA Next Gen by PCR, Nasal     Status: None   Collection Time: 06/05/23  3:15 PM   Specimen: Nasal Mucosa; Nasal Swab  Result Value Ref Range Status   MRSA by PCR Next Gen NOT DETECTED NOT DETECTED Final    Comment: (NOTE) The GeneXpert MRSA Assay (FDA approved for NASAL specimens only), is one component of a comprehensive MRSA colonization surveillance program. It is not intended to diagnose MRSA infection nor to guide or monitor treatment for MRSA infections. Test performance is  not FDA approved in patients less than 76 years old. Performed at [facility], [address], [place], [place] [address]       Radiology Studies: No results found.      LOS: 7 days   Keiran Sias [employer] on [email or web address]  06/10/2023, 10:36 AM

## 2023-06-11 ENCOUNTER — Telehealth: Payer: Self-pay

## 2023-06-11 DIAGNOSIS — A419 Sepsis, unspecified organism: Secondary | ICD-10-CM | POA: Diagnosis not present

## 2023-06-11 LAB — CBC
HCT: 27.8 % — ABNORMAL LOW (ref 39.0–52.0)
Hemoglobin: 8.5 g/dL — ABNORMAL LOW (ref 13.0–17.0)
MCH: 27.9 pg (ref 26.0–34.0)
MCHC: 30.6 g/dL (ref 30.0–36.0)
MCV: 91.1 fL (ref 80.0–100.0)
Platelets: 236 10*3/uL (ref 150–400)
RBC: 3.05 MIL/uL — ABNORMAL LOW (ref 4.22–5.81)
RDW: 17.2 % — ABNORMAL HIGH (ref 11.5–15.5)
WBC: 11.6 10*3/uL — ABNORMAL HIGH (ref 4.0–10.5)
nRBC: 0 % (ref 0.0–0.2)

## 2023-06-11 LAB — SUSCEPTIBILITY RESULT

## 2023-06-11 LAB — SUSCEPTIBILITY, AER + ANAEROB

## 2023-06-11 MED ORDER — FAMOTIDINE 20 MG PO TABS: 20.0000 mg | ORAL_TABLET | Freq: Two times a day (BID) | ORAL | 0 refills | Status: DC

## 2023-06-11 MED ORDER — MIDODRINE HCL 5 MG PO TABS: 5.0000 mg | ORAL_TABLET | Freq: Three times a day (TID) | ORAL | 2 refills | Status: DC

## 2023-06-11 MED ORDER — PRIMIDONE 50 MG PO TABS: 50.0000 mg | ORAL_TABLET | Freq: Two times a day (BID) | ORAL | Status: DC

## 2023-06-11 NOTE — Telephone Encounter (Signed)
Appt information mailed and sent to patient via [place].

## 2023-06-11 NOTE — Discharge Summary (Addendum)
 [facility]  Physician Discharge Summary   Patient ID: Calvin Escobar MRN: [id number] DOB/AGE: 07-Mar-1951 73 y.o.  Admit date: 06/02/2023 Discharge date: 06/11/2023    PCP: [name], MD  DISCHARGE DIAGNOSES:    Septic shock from UTI   Colovesical fistula   OSA treated with BiPAP   Acute respiratory failure with hypoxia (HCC)   Normocytic anemia   LBBB (left bundle branch block)   Thrombocytopenia (HCC)   Stage 3 chronic kidney disease (HCC)   HFrEF (heart failure with reduced ejection fraction) (HCC)   Bipolar affective disorder, depressed (HCC)   Benign prostatic hyperplasia with urinary obstruction   Dementia (HCC)   Tremor   Hyperlipidemia   Urinary tract infection without hematuria Pseudomonas UTI  RECOMMENDATIONS FOR OUTPATIENT FOLLOW UP: Gastroenterology has arranged outpatient appointment for patient to discuss colonoscopy. Following the colonoscopy he will need to to be evaluated by general surgery for surgical resection. Follow-up with urology as well.  He will be discharged with Foley catheter in place. Final urine culture report is not yet available.    Home Health: PT and OT Equipment/Devices: None  CODE STATUS: Full code  DISCHARGE CONDITION: fair  Diet recommendation: As before  INITIAL HISTORY: PMH of HTN, HLD, BPH, HFrEF, bipolar disorder, OSA on CPAP, tremor presented to hospital with complaints of confusion and fall. 12/8 - 12/12 hospitalized for confusion and was treated for severe sepsis secondary to E. coli bacteremia from UTI-emphysematous cystitis. Has not been eating or drinking prior to admission and had worsening tremors and then had a fall and unable to get up all by himself and therefore was brought to the hospital. Currently being treated for severe sepsis from Pseudomonas UTI. Concern was also raised for colovesical fistula.   Consultants: General surgery.  Urology.  Critical care medicine   Procedures:  None  HOSPITAL COURSE:   Septic shock from Pseudomonas UTI Met SIRS criteria with fever, tachypnea.  Evidence of infection in the urine.  Organ damage with AKI, lactic acidosis as well as hypotension. Treated with IV fluid.  Responded well.  No evidence of shock. Had to be transferred to ICU due to shock on 1/28.  Required IV vasopressors briefly.  Blood pressure has stabilized.   Patient noted to be on midodrine .  Off of vasopressors. Seems to be stable from a hemodynamic standpoint.  Will be discharged on midodrine . Urine culture is growing Pseudomonas.  [facility] lab could not perform sensitivities so they had to send it out to an outside lab.  In the meantime clinically patient has improved.  He has completed more than 7 days of antibiotics.  General surgery and urology have not recommended continuing antibiotics.  Okay for discharge.   ADDENDUM Urine C/s results seen after discharge. Pseudomonas was sensitive to Pip/tazo. No need for any further treatment at this time.   Hematuria Had transient hematuria which has resolved.  Hemoglobin has been stable.     Colovesical fistula. CT abdomen pelvis with and without contrast shows evidence of large amount of gas and debris's in the urinary bladder along with moderate right-sided hydronephrosis.  As well as concern for colovesical fistula. General surgery was consulted as well as urology. Continues to have a lot of urine output from his rectum as a result of the fistula. No plans for surgical intervention noted.  Conservative management will be placed for now. General surgery and urology continues to follow.  Conservative management is being pursued.  If these conservative measures fail  percutaneous nephrostomy to be considered according to the notes. Recently had a cystoscopy which did not show any evidence of colovesical fistula. Prior to any colectomy patient will need to undergo colonoscopy and cardiac clearance.  Again no plans for any  surgery currently so does not need for GI and cardiology involvement at this time.   It appears that he has been previously seen by [facility] gastroenterology.  Message sent to gastroenterology and they have arranged appointment for the patient to discuss colonoscopy.   Acute respiratory failure with hypoxia. Possible aspiration event in sleep. On 1/1 patient woke up from his nap with sudden onset of shortness of breath with upper airway wheezing.  His heart rate went up to 160s and respiratory rate was in 30s.   Patient was transferred to the ICU.  Placed on BiPAP.  He slowly improved. Respiratory status appears to be stable.    Recurrent fall. Most likely syncope in the setting of sepsis and hypotension. Similar presentation recently which improved with hydration and treatment of sepsis.   Chronic HFrEF. Echocardiogram from December 2024 showed EF to be 50 to 55%.  Echo from October 2024 showed EF to be 35 to 40%. Does not appear to be volume overloaded currently.   Considering that he is still requiring midodrine  we will not discharge him on carvedilol  or losartan  at this time.    Normocytic anemia Stable for the most part.   OSA. On CPAP.  Will continue.   Chronic tremors. On primidone .   History of dementia. Delirium secondary to acute metabolic encephalopathy. Currently stable.   Bipolar disorder. On home regimen including Depakote , BuSpar , trazodone , Remeron  and Abilify .   HLD. On Pravachol .  Continue.   Concern for CKD-ruled out.    Obesity Class 1 Body mass index is 31.8 kg/m.  Placing the pt at higher risk of poor outcomes.    Dysphagia Speech therapy has been following.  Currently on dysphagia 3 diet.  Patient is stable.  Wants to go home.  He is afebrile.  Feels well.  Okay for discharge home today.  PERTINENT LABS:  The results of significant diagnostics from this hospitalization (including imaging, microbiology, ancillary and laboratory) are listed  below for reference.    Microbiology: Recent Results (from the past 240 hours)  MIC (3 Drug) -Urine Culture [[id number]]; 06/02/2023; Urine, Random; pseudomonas aeruginosa; Piperacillin Nadine; Other; cefepime , ceftazidime; Other; imipenem; Patient immune status: Normal     Status: None   Collection Time: 06/05/23  7:37 AM   Specimen: Urine, Random; Blood  Result Value Ref Range Status   Min Inhibitory Conc (3 Drugs) REFERT  Corrected    Comment: (NOTE) Please refer to the following specimen for additional lab results. Please refer to spec # [id number] for test result. Almarie Schneider notified 06-07-2023 at 17:55. Performed At: [facility] [address] [place], [place] [id number] Jennette Shorter MD Ey:[id number] CORRECTED ON 01/03 AT 1835: PREVIOUSLY REPORTED AS Preliminary report    Source Urine, Random  Final    Comment: Performed at [facility], [address] [place], [place] [address]  Result     Status: None   Collection Time: 06/05/23  7:37 AM  Result Value Ref Range Status   Result 1 Comment  Final    Comment: (NOTE) Pseudomonas aeruginosa Identification performed by account, not confirmed by this laboratory. Performed At: [facility] [address] [place], [place] [id number] Jennette Shorter MD Ey:[id number]   Susceptibility, Aer + Anaerob  Status: Abnormal   Collection Time: 06/05/23  7:37 AM  Result Value Ref Range Status   Suscept, Aer + Anaerob Final report (A)  Corrected    Comment: (NOTE) Performed At: [facility] [address] [place], [place] [id number] Jennette Shorter MD Ey:[id number] CORRECTED ON 01/07 AT 0935: PREVIOUSLY REPORTED AS Preliminary report    Source PENDING  Incomplete  Susceptibility Result     Status: Abnormal   Collection Time: 06/05/23  7:37 AM  Result Value Ref Range Status   Suscept Result 1 Comment (A)  Final    Comment: (NOTE) Pseudomonas aeruginosa Identification performed  by account, not confirmed by this laboratory. Testing performed by broth microdilution.    Antimicrobial Suscept Comment  Corrected    Comment: (NOTE)      ** S = Susceptible; I = Intermediate; R = Resistant **                   P = Positive; N = Negative            MICS are expressed in micrograms per mL   Antibiotic                 RSLT#1    RSLT#2    RSLT#3    RSLT#4 Amikacin                       S Cefepime                        S Ceftazidime                    S Ceftazidime/avibactam          S Ceftolozane/tazobactam         S Ciprofloxacin                  S Levofloxacin                    S Meropenem                      S Piperacillin /Tazobactam        S Tobramycin                     S Performed At: [facility] [employer] [address] [place], [place] [id number] Jennette Shorter MD Ey:[id number]   MRSA Next Gen by PCR, Nasal     Status: None   Collection Time: 06/05/23  3:15 PM   Specimen: Nasal Mucosa; Nasal Swab  Result Value Ref Range Status   MRSA by PCR Next Gen NOT DETECTED NOT DETECTED Final    Comment: (NOTE) The GeneXpert MRSA Assay (FDA approved for NASAL specimens only), is one component of a comprehensive MRSA colonization surveillance program. It is not intended to diagnose MRSA infection nor to guide or monitor treatment for MRSA infections. Test performance is not FDA approved in patients less than 9 years old. Performed at [facility], [address], [place], [place] [address]      Labs:   Basic Metabolic Panel: Recent Labs  Lab 06/06/23 0310 06/07/23 0305 06/08/23 0434 06/10/23 0358  NA 135 138  140 139 139  K 4.0 4.1  4.2 4.1 4.3  CL 105 105  106 105 103  CO2 22 25  26 28 27   GLUCOSE 102* 93  97 87 85  BUN 17  19  18 15 13   CREATININE 1.33* 0.96  1.11 0.95 1.13  CALCIUM  7.7* 8.1*  8.2* 8.3* 8.4*  MG 2.4 2.5* 2.5*  --   PHOS 3.6  3.3 3.1 3.6  --    Liver Function Tests: Recent Labs  Lab 06/06/23 0310  06/07/23 0305  AST  --  33  ALT  --  23  ALKPHOS  --  49  BILITOT  --  0.5  PROT  --  6.5  ALBUMIN  2.6* 2.7*  2.8*   CBC: Recent Labs  Lab 06/06/23 0310 06/06/23 2214 06/07/23 0305 06/09/23 1525 06/10/23 0358 06/11/23 0350  WBC 9.9  --  7.9 7.9 9.2 11.6*  NEUTROABS  --   --  4.8  --   --   --   HGB 7.0* 8.0* 8.3* 9.0* 8.4* 8.5*  HCT 23.3* 25.5* 27.0* 29.3* 28.5* 27.8*  MCV 93.6  --  89.7 90.2 91.3 91.1  PLT 121*  --  164 235 254 236    BNP: BNP (last 3 results) Recent Labs    05/13/23 0645 06/05/23 2157  BNP 83.1 168.9*      IMAGING STUDIES DG CHEST PORT 1 VIEW Result Date: 06/07/2023 CLINICAL DATA:  Shortness of breath. EXAM: PORTABLE CHEST 1 VIEW COMPARISON:  Chest radiographs 06/05/2023, 06/02/2023, 05/12/2023, 01/29/2016; CT chest 03/16/2008 FINDINGS: Cardiac silhouette and mediastinal contours are within normal limits for AP technique. Unchanged chronic benign calcified granuloma within the lateral right midlung. Moderately decreased lung volumes. Mild bilateral likely chronic interstitial thickening is unchanged from prior. No pleural effusion or pneumothorax. No acute skeletal abnormality. IMPRESSION: Moderately decreased lung volumes. No acute cardiopulmonary process. Electronically Signed   By: Tanda Lyons M.D.   On: 06/07/2023 11:01   DG CHEST PORT 1 VIEW Result Date: 06/05/2023 CLINICAL DATA:  Shortness of breath. EXAM: PORTABLE CHEST 1 VIEW COMPARISON:  Chest radiograph dated 06/02/2023. FINDINGS: The heart size and mediastinal contours are within normal limits. The lung volumes are low. Both lungs are clear. The visualized skeletal structures are unremarkable. IMPRESSION: Low lung volumes. No active disease. Electronically Signed   By: Norman Hopper M.D.   On: 06/05/2023 12:55   CT ABDOMEN PELVIS W WO CONTRAST Result Date: 06/02/2023 CLINICAL DATA:  73 year old male with history of dementia. Suspected urolithiasis. EXAM: CT ABDOMEN AND PELVIS WITHOUT AND  WITH CONTRAST TECHNIQUE: Multidetector CT imaging of the abdomen and pelvis was performed following the standard protocol before and following the bolus administration of intravenous contrast. RADIATION DOSE REDUCTION: This exam was performed according to the departmental dose-optimization program which includes automated exposure control, adjustment of the mA and/or kV according to patient size and/or use of iterative reconstruction technique. CONTRAST:  75mL OMNIPAQUE  IOHEXOL  350 MG/ML SOLN COMPARISON:  CT of the abdomen and pelvis 05/16/2023. FINDINGS: Lower chest: Calcified granuloma in the right middle lobe. Atherosclerotic calcifications in the left main, left anterior descending and right coronary arteries. Hepatobiliary: No definite suspicious cystic or solid hepatic lesions are confidently identified on today's noncontrast CT examination. Unenhanced appearance of the gallbladder is unremarkable. Pancreas: No definite pancreatic mass or peripancreatic fluid collections or inflammatory changes are noted on today's noncontrast CT examination. Spleen: Unremarkable. Adrenals/Urinary Tract: There is a large amount of gas present in the nondependent aspect of the urinary bladder. There also appears to be some debris dependently in the urinary bladder, which also contains a small amount of gas (best appreciated on axial image 91 of series 3). Superior aspect of the urinary  bladder appears thickened and intimately associated with the adjacent sigmoid colon, centered around what appears to be a large diverticulum, best appreciated on coronal image 56 of series 10. Small amount of gas in the superior wall of the urinary bladder (coronal image 57 of series 10). Moderate right and mild left hydroureteronephrosis. Small amount of gas within the collecting systems of both kidneys (right greater than left). Unenhanced appearance of the kidneys and bilateral adrenal glands is otherwise unremarkable. Stomach/Bowel:  Unenhanced appearance of the stomach is normal. No pathologic dilatation of small bowel or colon. 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 Normal appendix. Vascular/Lymphatic: Atherosclerosis in the abdominal aorta and pelvic vasculature. No lymphadenopathy noted in the abdomen or pelvis. Reproductive: Prostate gland and seminal vesicles are unremarkable in appearance. Other: No significant volume of ascites.  No pneumoperitoneum. Musculoskeletal: There are no aggressive appearing lytic or blastic lesions noted in the visualized portions of the skeleton. IMPRESSION: 1. 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 Urologic consultation is recommended for further clinical evaluation. 2. Aortic atherosclerosis. Electronically Signed   By: Toribio Aye M.D.   On: 06/02/2023 10:19   CT Head Wo Contrast Result Date: 06/02/2023 CLINICAL DATA:  The patient fell out of bed today sustaining head and neck trauma. EXAM: CT HEAD WITHOUT CONTRAST CT CERVICAL SPINE WITHOUT CONTRAST TECHNIQUE: Multidetector CT imaging of the head and cervical spine was performed following the standard protocol without intravenous contrast. Multiplanar CT image reconstructions of the cervical spine were also generated. RADIATION DOSE REDUCTION: This exam was performed according to the departmental dose-optimization program which includes automated exposure control, adjustment of the mA and/or kV according to patient size and/or use of iterative reconstruction technique. COMPARISON:  Head CT and cervical spine CT recently both on 05/13/2023. FINDINGS: CT HEAD FINDINGS Brain: There is mild global atrophy, mild atrophic ventriculomegaly and  small-vessel disease of the cerebral white matter, chronic senescent mineralization in the basal ganglia. No new abnormality concerning for a cortical based acute infarct, hemorrhage, mass or mass effect is seen and no midline shift. Basal cisterns are clear. There have been no appreciable interval changes. Vascular: No hyperdense vessel or unexpected calcification. Skull: Negative for fractures or focal lesions. Sinuses/Orbits: Patchy membrane thickening again noted in the ethmoid air cells with mild membrane disease in the maxillary sinuses. The frontal and sphenoid sinus, bilateral mastoid air cells, and middle ears are clear. The nasal septum is S shaped. Old lens extractions with otherwise negative orbits. Other: None. CT CERVICAL SPINE FINDINGS Alignment: Reversed cervical lordosis centered at C4-5, stable, with unchanged 3 mm grade 1 C4-5 anterolisthesis. No other listhesis is seen. Bone-on-bone anterior atlantodental joint space loss is also again noted, with osteophytes. Skull base and vertebrae: There is mild osteopenia without evidence of fractures, primary bone lesions or focal pathologic process. There is chronic ankylosis across the C4-5 and C5-6 facet joints. There are anterior bridging osteophytes C4-7. Soft tissues and spinal canal: No prevertebral fluid or swelling. No visible canal hematoma. There are minimal calcifications at the carotid bifurcations. No thyroid  or laryngeal mass. Disc levels: There is chronic disc collapse C5-6, C6-7, C7-T1, normal disc heights above C5. There are dorsal disc osteophyte complexes C4-5 through C7-T1, associated with spinal canal stenosis and mild spondylotic  cord compression from C4-5 through C6-7. The upper levels do not show significant soft tissue or bony encroachment on the spinal canal. There is multilevel facet joint and uncinate hypertrophy, with foraminal stenosis which is severe on the left at C2-3, bilaterally severe C3-4 through C5-6, bilaterally  moderate to severe C6-7. Upper chest: Negative. Other: None. IMPRESSION: 1. No acute intracranial CT findings or depressed skull fractures. 2. Atrophy and small-vessel disease. 3. 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 4. Osteopenia and degenerative change without evidence of cervical fractures. 5. Reversed cervical lordosis with grade 1 C4-5 degenerative anterolisthesis. 6. Multilevel spinal canal and foraminal stenosis. 7. Carotid atherosclerosis. Electronically Signed   By: Francis Quam M.D.   On: 06/02/2023 07:03   CT Cervical Spine Wo Contrast Result Date: 06/02/2023 CLINICAL DATA:  The patient fell out of bed today sustaining head and neck trauma. EXAM: CT HEAD WITHOUT CONTRAST CT CERVICAL SPINE WITHOUT CONTRAST TECHNIQUE: Multidetector CT imaging of the head and cervical spine was performed following the standard protocol without intravenous contrast. Multiplanar CT image reconstructions of the cervical spine were also generated. RADIATION DOSE REDUCTION: This exam was performed according to the departmental dose-optimization program which includes automated exposure control, adjustment of the mA and/or kV according to patient size and/or use of iterative reconstruction technique. COMPARISON:  Head CT and cervical spine CT recently both on 05/13/2023. FINDINGS: CT HEAD FINDINGS Brain: There is mild global atrophy, mild atrophic ventriculomegaly and small-vessel disease of the cerebral white matter, chronic senescent mineralization in the basal ganglia. No new abnormality concerning for a cortical based acute infarct, hemorrhage, mass or mass effect is seen and no midline shift. Basal cisterns are clear. There have been no appreciable interval changes. Vascular: No hyperdense vessel or unexpected calcification. Skull: Negative for fractures or focal lesions. Sinuses/Orbits: Patchy membrane thickening again noted in the ethmoid air cells with mild membrane disease in the maxillary sinuses. The  frontal and sphenoid sinus, bilateral mastoid air cells, and middle ears are clear. The nasal septum is S shaped. Old lens extractions with otherwise negative orbits. Other: None. CT CERVICAL SPINE FINDINGS Alignment: Reversed cervical lordosis centered at C4-5, stable, with unchanged 3 mm grade 1 C4-5 anterolisthesis. No other listhesis is seen. Bone-on-bone anterior atlantodental joint space loss is also again noted, with osteophytes. Skull base and vertebrae: There is mild osteopenia without evidence of fractures, primary bone lesions or focal pathologic process. There is chronic ankylosis across the C4-5 and C5-6 facet joints. There are anterior bridging osteophytes C4-7. Soft tissues and spinal canal: No prevertebral fluid or swelling. No visible canal hematoma. There are minimal calcifications at the carotid bifurcations. No thyroid  or laryngeal mass. Disc levels: There is chronic disc collapse C5-6, C6-7, C7-T1, normal disc heights above C5. There are dorsal disc osteophyte complexes C4-5 through C7-T1, associated with spinal canal stenosis and mild spondylotic cord compression from C4-5 through C6-7. The upper levels do not show significant soft tissue or bony encroachment on the spinal canal. There is multilevel facet joint and uncinate hypertrophy, with foraminal stenosis which is severe on the left at C2-3, bilaterally severe C3-4 through C5-6, bilaterally moderate to severe C6-7. Upper chest: Negative. Other: None. IMPRESSION: 1. No acute intracranial CT findings or depressed skull fractures. 2. Atrophy and small-vessel disease. 3. 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 4. Osteopenia and degenerative change without evidence of cervical fractures. 5. Reversed cervical lordosis with grade 1 C4-5 degenerative anterolisthesis. 6. Multilevel spinal canal and foraminal stenosis. 7. Carotid atherosclerosis. Electronically  Signed   By: Francis Quam M.D.   On: 06/02/2023 07:03   DG Chest Port 1  View Result Date: 06/02/2023 CLINICAL DATA:  73 year old male with possible sepsis. Fall, altered mental status. EXAM: PORTABLE CHEST 1 VIEW COMPARISON:  Portable chest 05/12/2023 and earlier. FINDINGS: Portable AP semi upright view at 0546 hours. Stable low lung volumes. More rotated to the left now. Stable cardiac size and mediastinal contours. Stable lung volumes and ventilation. Calcified right midlung granuloma (no follow-up imaging recommended). No pneumothorax, pleural effusion, acute pulmonary opacity. Paucity of bowel gas the visible abdomen. No acute osseous abnormality identified. IMPRESSION: Chronically Low lung volumes. No acute cardiopulmonary abnormality or acute traumatic injury identified. Electronically Signed   By: VEAR Hurst M.D.   On: 06/02/2023 05:59   CT RENAL STONE STUDY Result Date: 05/16/2023 CLINICAL DATA:  Flank pain, bacteremia, UTI EXAM: CT ABDOMEN AND PELVIS WITHOUT CONTRAST TECHNIQUE: Multidetector CT imaging of the abdomen and pelvis was performed following the standard protocol without IV contrast. RADIATION DOSE REDUCTION: This exam was performed according to the departmental dose-optimization program which includes automated exposure control, adjustment of the mA and/or kV according to patient size and/or use of iterative reconstruction technique. COMPARISON:  09/12/2016 FINDINGS: Lower chest: No acute abnormality. Relative hypoattenuation of the cardiac blood pool indicative of anemia. Hepatobiliary: Unremarkable unenhanced appearance of the liver. No focal liver lesion identified. Gallbladder within normal limits. No hyperdense gallstone. No biliary dilatation. Pancreas: Unremarkable. No pancreatic ductal dilatation or surrounding inflammatory changes. Spleen: Normal in size without focal abnormality. Adrenals/Urinary Tract: Unremarkable adrenal glands. Mild left hydroureteronephrosis. No renal or ureteral calculi. No solid renal lesion. No right-sided hydronephrosis. There  is wall thickening of the urinary bladder, most pronounced posteriorly. Moderate volume of air within the bladder lumen. Stomach/Bowel: Stomach is within normal limits. Appendix appears normal. No dilated loops of bowel. Extensive colonic diverticulosis. There is mild fat stranding adjacent to the proximal sigmoid colon. There is mild long segment wall thickening of the sigmoid colon. No focally inflamed diverticulum is identified. Vascular/Lymphatic: Aortic atherosclerosis. No enlarged abdominal or pelvic lymph nodes. Reproductive: Mildly enlarged prostate gland. Other: No free fluid. No abdominopelvic fluid collection. No pneumoperitoneum. No abdominal wall hernia. Musculoskeletal: No acute or significant osseous findings. IMPRESSION: 1. Wall thickening of the urinary bladder, most pronounced posteriorly. Moderate volume of air within the bladder lumen. Findings are suggestive of cystitis. Correlate with urinalysis. 2. Mild left hydroureteronephrosis. No renal or ureteral calculi. 3. Extensive colonic diverticulosis. Mild fat stranding adjacent to the proximal sigmoid colon. There is mild long segment wall thickening of the sigmoid colon, which may reflect sequela of chronic diverticulitis. Early changes of diverticulitis is not excluded. Correlate with patient's symptoms. 4. Mildly enlarged prostate gland. 5. Relative hypoattenuation of the cardiac blood pool indicative of anemia. 6. Aortic atherosclerosis (ICD10-I70.0). Electronically Signed   By: Mabel Converse D.O.   On: 05/16/2023 11:33    DISCHARGE EXAMINATION: Vitals:   06/10/23 1413 06/10/23 2018 06/11/23 0515 06/11/23 1016  BP: 131/86 136/87 106/72 102/77  Pulse: 86 99 (!) 101 100  Resp: 16 18 17 16   Temp: 98.3 F (36.8 C) 98.4 F (36.9 C) 99.2 F (37.3 C) 99.1 F (37.3 C)  TempSrc: Oral Oral Oral Oral  SpO2: 98% 98% 93% 96%  Weight:      Height:       General appearance: Awake alert.  In no distress Resp: Clear to auscultation  bilaterally.  Normal effort Cardio: S1-S2 is normal regular.  No S3-S4.  No rubs murmurs or bruit GI: Abdomen is soft.  Nontender nondistended.  Bowel sounds are present normal.  No masses organomegaly    DISPOSITION: Home  Discharge Instructions     Call MD for:  difficulty breathing, headache or visual disturbances   Complete by: As directed    Call MD for:  extreme fatigue   Complete by: As directed    Call MD for:  persistant dizziness or light-headedness   Complete by: As directed    Call MD for:  persistant nausea and vomiting   Complete by: As directed    Call MD for:  severe uncontrolled pain   Complete by: As directed    Call MD for:  temperature >100.4   Complete by: As directed    Discharge instructions   Complete by: As directed    Please take your medications as prescribed.  Please be sure to follow-up with your primary care provider.  I tried to send referral to [facility] gastroenterology to consider doing a colonoscopy following which you will need to be seen by general surgery for resection of the problem area of the colon.  Please reach out to your primary care provider if you do not hear from your gastroenterologist in the next 1 week.  You were cared for by a hospitalist during your hospital stay. If you have any questions about your discharge medications or the care you received while you were in the hospital after you are discharged, you can call the unit and asked to speak with the hospitalist on call if the hospitalist that took care of you is not available. Once you are discharged, your primary care physician will handle any further medical issues. Please note that NO REFILLS for any discharge medications will be authorized once you are discharged, as it is imperative that you return to your primary care physician (or establish a relationship with a primary care physician if you do not have one) for your aftercare needs so that they can reassess your need for  medications and monitor your lab values. If you do not have a primary care physician, you can call [phone] for a physician referral.   Increase activity slowly   Complete by: As directed          Allergies as of 06/11/2023       Reactions   Albuterol  Palpitations   Supraventricular Tachycardia         Medication List     STOP taking these medications    carvedilol  12.5 MG tablet Commonly known as: COREG    losartan  50 MG tablet Commonly known as: COZAAR        TAKE these medications    ARIPiprazole  2 MG tablet Commonly known as: ABILIFY  Take 2 mg by mouth daily.   ascorbic acid 500 MG tablet Commonly known as: VITAMIN C Take 500 mg by mouth daily.   busPIRone  10 MG tablet Commonly known as: BUSPAR  Take 10 mg by mouth 3 (three) times daily.   calcium -vitamin D  500-5 MG-MCG tablet Commonly known as: OSCAL WITH D Take 1 tablet by mouth daily with breakfast.   divalproex  250 MG 24 hr tablet Commonly known as: DEPAKOTE  ER Take 500 mg by mouth at bedtime.   famotidine  20 MG tablet Commonly known as: PEPCID  Take 1 tablet (20 mg total) by mouth 2 (two) times daily.   folic acid  1 MG tablet Commonly known as: FOLVITE  Take 1 tablet (1 mg total) by mouth daily.  gabapentin  300 MG capsule Commonly known as: NEURONTIN  Take 300 mg by mouth at bedtime.   midodrine  5 MG tablet Commonly known as: PROAMATINE  Take 1 tablet (5 mg total) by mouth 3 (three) times daily with meals.   mirtazapine  30 MG tablet Commonly known as: REMERON  Take 30 mg by mouth at bedtime.   multivitamin tablet Take 1 tablet by mouth daily.   pravastatin  20 MG tablet Commonly known as: PRAVACHOL  Take 1 tablet (20 mg total) by mouth at bedtime.   primidone  50 MG tablet Commonly known as: MYSOLINE  Take 1 tablet (50 mg total) by mouth 2 (two) times daily. What changed:  when to take this reasons to take this additional instructions   tamsulosin  0.4 MG Caps capsule Commonly known  as: FLOMAX  TAKE 1 CAPSULE BY MOUTH EVERY DAY   traZODone  100 MG tablet Commonly known as: DESYREL  Take 300 mg by mouth at bedtime.   venlafaxine  XR 150 MG 24 hr capsule Commonly known as: EFFEXOR -XR Take 300 mg by mouth daily with breakfast.          Follow-up Information     [facility], [facility]  Follow up.   Why: HH physical therapy Contact information: [address] [place] [address] [phone]         Surgery, [place]. Call.   Specialty: General Surgery Why: Call to schedule colorectal surgical follow up once colonscopy is scheduled Contact information: [address] [place] [address] [phone]         Mollie Nestor HERO, PA-C Follow up on 06/25/2023.   Specialty: Gastroenterology Why: on 06/25/2023 at [facility] information: [address] [place] [address] [phone]                 TOTAL DISCHARGE TIME: 35 minutes  Edwards Mckelvie Verdene  [facility] Pager on [email or web address]  06/12/2023, 10:28 AM

## 2023-06-11 NOTE — Progress Notes (Signed)
 AVS and discharge instructions reviewed w/ patient and spouse at the bedside. All questions answered. Patient provided with extra foley catheter supplies and education.

## 2023-06-11 NOTE — Plan of Care (Signed)
  Problem: Activity: Goal: Risk for activity intolerance will decrease Outcome: Progressing   Problem: Nutrition: Goal: Adequate nutrition will be maintained Outcome: Progressing   Problem: Coping: Goal: Level of anxiety will decrease Outcome: Progressing   Problem: Pain Management: Goal: General experience of comfort will improve Outcome: Progressing

## 2023-06-12 ENCOUNTER — Telehealth: Payer: Self-pay

## 2023-06-12 NOTE — Telephone Encounter (Signed)
 Requested STAT verbal order for RN from home health company to come out and evaluate patient for urinating blood only. Gave verbal orders for this. She informed me that patient does not want to go back to the hospital and advised me that he was recently discharged from the hospital. Also, that she will have patient's wife to call back and reschedule his missed appointment with Dr. Jesus.

## 2023-06-13 DIAGNOSIS — N130 Hydronephrosis with ureteropelvic junction obstruction: Secondary | ICD-10-CM | POA: Diagnosis not present

## 2023-06-13 DIAGNOSIS — N3021 Other chronic cystitis with hematuria: Secondary | ICD-10-CM | POA: Diagnosis not present

## 2023-06-13 DIAGNOSIS — N321 Vesicointestinal fistula: Secondary | ICD-10-CM | POA: Diagnosis not present

## 2023-06-14 ENCOUNTER — Telehealth: Payer: Self-pay | Admitting: Internal Medicine

## 2023-06-14 DIAGNOSIS — N136 Pyonephrosis: Secondary | ICD-10-CM | POA: Diagnosis not present

## 2023-06-14 DIAGNOSIS — I110 Hypertensive heart disease with heart failure: Secondary | ICD-10-CM | POA: Diagnosis not present

## 2023-06-14 DIAGNOSIS — N3000 Acute cystitis without hematuria: Secondary | ICD-10-CM | POA: Diagnosis not present

## 2023-06-14 DIAGNOSIS — G319 Degenerative disease of nervous system, unspecified: Secondary | ICD-10-CM | POA: Diagnosis not present

## 2023-06-14 DIAGNOSIS — F319 Bipolar disorder, unspecified: Secondary | ICD-10-CM | POA: Diagnosis not present

## 2023-06-14 DIAGNOSIS — A4151 Sepsis due to Escherichia coli [E. coli]: Secondary | ICD-10-CM | POA: Diagnosis not present

## 2023-06-14 DIAGNOSIS — I420 Dilated cardiomyopathy: Secondary | ICD-10-CM | POA: Diagnosis not present

## 2023-06-14 DIAGNOSIS — F0283 Dementia in other diseases classified elsewhere, unspecified severity, with mood disturbance: Secondary | ICD-10-CM | POA: Diagnosis not present

## 2023-06-14 DIAGNOSIS — I5022 Chronic systolic (congestive) heart failure: Secondary | ICD-10-CM | POA: Diagnosis not present

## 2023-06-14 NOTE — Telephone Encounter (Signed)
 Copied from CRM [id number]. Topic: Clinical - Home Health Verbal Orders >> Jun 13, 2023  3:23 PM Viola FALCON wrote: Caller/Agency: Burnard from [facility] Callback Number: [phone] ( Secure Line - Please leave vm with verbal orders) Service Requested: Physical Therapy Frequency: Once a week for 4 weeks, Social worker evaluation for home assessment/supervision Any new concerns about the patient? No, but wants someone to check on him daily or every other day

## 2023-06-17 ENCOUNTER — Ambulatory Visit: Payer: Self-pay | Admitting: Internal Medicine

## 2023-06-17 NOTE — Telephone Encounter (Signed)
 Left vm with verbal orders for patient.

## 2023-06-17 NOTE — Telephone Encounter (Signed)
 Copied from CRM [id number]. Topic: Clinical - Pink Word Triage >> Jun 17, 2023  8:38 AM Robinson H wrote: Reason for Triage: Patients wife called stating patient went to emergency room for a fall and a UTI, hospital put patient on medications and took patient off of medication that Dr. Jesus put patient on now patient is having tremors, sleeping all day up all night and only drinking liquids. Patients wife was at work and not with patient but has a list of the medications that patient was put on at hospital with her.  [name] [phone]

## 2023-06-17 NOTE — Telephone Encounter (Signed)
 Called and left voicemail for Kerby Moors [phone]: requested call back.  Will attempt to reach out again.

## 2023-06-17 NOTE — Telephone Encounter (Signed)
 Copied from CRM [id number]. Topic: Clinical - Home Health Verbal Orders >> Jun 17, 2023  3:22 PM Isabell A wrote: Caller/Agency: Karna from [facility]  Callback Number: [phone] Service Requested: Skilled Nursing, PT & OT  Frequency: Nursing once a week for a few weeks to teach about the foli. (PT& OT will call in their own frequency) Any new concerns about the patient? Yes, patient is doing very well. 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   Spoke with Karna from [facility] and gave verbal orders for skilled nursing, PT and OT.

## 2023-06-17 NOTE — Telephone Encounter (Signed)
 Information collected from [facility] wife [name]. [name] would just like a second opinion the pts d/c paperwork and med changes.   Chief Complaint: falls Symptoms: anger, unknown reason for call,  Frequency: 3 falls total Disposition: [] ED /[] Urgent Care (no appt availability in office) / [] Appointment(In office/virtual)/ []  [facility]/ [] Home Care/ [] Refused Recommended Disposition /[] [facility] [facility]/ [x]  Follow-up with PCP Additional Notes: Pt with increased falls. Per wife pt is not eating like normal. Pt is more angry than normal. Pts wife stating that he is sleeping more during the day and then not at night. Pts wife would like staff to review his recent hospital discharge paperwork and advise what he should take vs not take because the wife believes they are negatively impacting the pt.   Reason for Disposition  [1] Caller has NON-URGENT question AND [2] triager unable to answer question  Answer Assessment - Initial Assessment Questions 1. MECHANISM: How did the fall happen?     Unsure as there was no witnesses-pt was transported [facility] via EMS. Second fall was in the bathroom. 3rd time per wife he seemed to fall out of the bed, while attempting to go the bathroom.  2. DOMESTIC VIOLENCE AND ELDER ABUSE SCREENING: Did you fall because someone pushed you or tried to hurt you? If Yes, ask: Are you safe now?     denies 3. ONSET: When did the fall happen? (e.g., minutes, hours, or days ago)     Wife states this has been going on for 2-3 weeks 4. LOCATION: What part of the body hit the ground? (e.g., back, buttocks, head, hips, knees, hands, head, stomach)     Denies injuries 5. INJURY: Did you hurt (injure) yourself when you fell? If Yes, ask: What did you injure? Tell me more about this? (e.g., body area; type of injury; pain severity)     Wife states he has fallen onto his back she believes.  6. PAIN: Is there any pain? If Yes, ask: How bad is the  pain? (e.g., Scale 1-10; or mild,  moderate, severe)   - NONE (0): No pain   - MILD (1-3): Doesn't interfere with normal activities    - MODERATE (4-7): Interferes with normal activities or awakens from sleep    - SEVERE (8-10): Excruciating pain, unable to do any normal activities      Denies  7. SIZE: For cuts, bruises, or swelling, ask: How large is it? (e.g., inches or centimeters)      Wife not sure as she never looked 9. OTHER SYMPTOMS: Do you have any other symptoms? (e.g., dizziness, fever, weakness; new onset or worsening).      Recently dx with UTI, was taken to hosp after falls and dx at that time.  10. CAUSE: What do you think caused the fall (or falling)? (e.g., tripped, dizzy spell)       Recent URI  Protocols used: Falls and [facility]

## 2023-06-18 ENCOUNTER — Ambulatory Visit: Payer: Self-pay | Admitting: Internal Medicine

## 2023-06-18 DIAGNOSIS — F319 Bipolar disorder, unspecified: Secondary | ICD-10-CM | POA: Diagnosis not present

## 2023-06-18 DIAGNOSIS — A4152 Sepsis due to Pseudomonas: Secondary | ICD-10-CM | POA: Diagnosis not present

## 2023-06-18 DIAGNOSIS — I5022 Chronic systolic (congestive) heart failure: Secondary | ICD-10-CM | POA: Diagnosis not present

## 2023-06-18 DIAGNOSIS — I130 Hypertensive heart and chronic kidney disease with heart failure and stage 1 through stage 4 chronic kidney disease, or unspecified chronic kidney disease: Secondary | ICD-10-CM | POA: Diagnosis not present

## 2023-06-18 DIAGNOSIS — F0283 Dementia in other diseases classified elsewhere, unspecified severity, with mood disturbance: Secondary | ICD-10-CM | POA: Diagnosis not present

## 2023-06-18 DIAGNOSIS — N136 Pyonephrosis: Secondary | ICD-10-CM | POA: Diagnosis not present

## 2023-06-18 DIAGNOSIS — G319 Degenerative disease of nervous system, unspecified: Secondary | ICD-10-CM | POA: Diagnosis not present

## 2023-06-18 DIAGNOSIS — N183 Chronic kidney disease, stage 3 unspecified: Secondary | ICD-10-CM | POA: Diagnosis not present

## 2023-06-18 DIAGNOSIS — R6521 Severe sepsis with septic shock: Secondary | ICD-10-CM | POA: Diagnosis not present

## 2023-06-18 NOTE — Telephone Encounter (Signed)
 Pt PT calling to inform PCP that the pts BP was 150/110, PT also stated that there was a hole in the bag and that there was blood in the bag.  The PT thought that maybe the pt had pulled the catheter out slightly. PT did not see a urology nurse at the pts home during his visit.  RN called pt wife and spoke to wife who has been at work all day and has not seen her husband.  Calvin Escobar states that she will drive home and access the pt.  It is unknown at this time if the urology RN has stopped and assessed the pt.  RN advised wife to call back if she finds blood in the bag or the bag leaking, if triage does not answer to seek ED treatment. Wife agreeable.  Wife states that they do not have a way to check BP at home.   Copied from CRM [id number]. Topic: General - Other >> Jun 18, 2023  3:25 PM Corin V wrote: Reason for CRM[facility] went to see patient today. He stated there is bleeding in the catheter bag and the bag had a leak in it. He had no replacement bag to switch it out. His resting hear rate was 110 average, but was fluctuating up and down. His blod pressure was 150/110. He was somewhat excited as his son was being confrontational during the visit Calvin Escobar stated the son appeared to have autism or a similar behavioral disorder).  He also stated the patietn's wife thought [facility] was possibly sending a nurse out at 1pm today but was unable to confirm with their office. Calvin Escobar nurse went out last Friday and going again on Thursday.

## 2023-06-18 NOTE — Telephone Encounter (Signed)
 Please see pt triage note

## 2023-06-18 NOTE — Telephone Encounter (Addendum)
 Patient's wife called in stating patient has a sore on his left arm that he did not have before he was admitted into [facility], and that patient has decreased appetite since returning home from [facility]. Patient's wife is concerned about cause of sore and also if medication is causing it, stating he came home on medications and I am not sure if he is supposed to be on these or not. I went by the office with the list of medications but the office was closed. This RN attempted further evaluation of wound on arm, but wife advised she was not with the patient at this time and could not give specific characteristics of the wound. Advised wife to instruct patient and caregivers to keep wound clean and covered with clean, sterile gauze and an ace bandage wrap, as wife stated patient has been scratching and picking at wound. This RN advised patient's wife that I will pass through a high priority message to PCP to inform PCP of concerns that the discharge list of medication is unclear if patient should still be taking these specific medications, and wife would like further consulting from the PCP. Educated wife on s/s of infection within wound and to call office back if these s/s developed.  Copied from CRM [id number]. Topic: Clinical - Pink Word Triage >> Jun 18, 2023 12:26 PM Renea ORN wrote: Pt. Has sores all over his body that is believed to be caused by one of the medications that he's taking. Reason for Disposition  [1] Follow-up call to recent contact AND [2] information only call, no triage required  Answer Assessment - Initial Assessment Questions 1. REASON FOR CALL or QUESTION: What is your reason for calling today? or How can I best help you? or What question do you have that I can help answer?     Wound and medication info  Protocols used: Information Only Call - No Triage-A-AH

## 2023-06-18 NOTE — Telephone Encounter (Signed)
 This RN spoke to pt's spouse regarding HH PT concerns. Pt's spouse reports she does not have any current concerns about his BP, no symptoms at this time. She denies being aware of any leaking from the catheter but was also unable to tell me if there was a nurse who changed it and how often that occurred. She did ask a message be forwarded to the provider regarding some of her other concerns for the pt.  Spouse reports that pt is up wandering the house and going downstairs in the middle of the night, she reports she is concerned about his safety but often the next morning he does not remember this happening. She reports he often says things like I feel like I'm losing my mind and seems to be more forgetful and confused at times. She is requesting the provider look over his medications to see if there is anything that can be adjusted or added for his recent increasing anxiety. Forwarding to provider for review.  Copied from CRM [id number]. Topic: General - Other >> Jun 18, 2023  3:25 PM Corin V wrote: Reason for CRM[facility] went to see patient today. He stated there is bleeding in the catheter bag and the bag had a leak in it. He had no replacement bag to switch it out. His resting hear rate was 110 average, but was fluctuating up and down. His blod pressure was 150/110. He was somewhat excited as his son was being confrontational during the visit Charolotte stated the son appeared to have autism or a similar behavioral disorder).  He also stated the patietn's wife thought [facility] was possibly sending a nurse out at 1pm today but was unable to confirm with their office. [facility] nurse went out last Friday and going again on Thursday. Reason for Disposition  [1] Caller requesting NON-URGENT health information AND [2] PCP's office is the best resource  Answer Assessment - Initial Assessment Questions 1. REASON FOR CALL or QUESTION: What is your reason for calling today?  or How can I best help you? or What question do you have that I can help answer?     Spoke to pt's wife to follow up with [facility] message  Protocols used: Information Only Call - No Triage-A-AH

## 2023-06-18 NOTE — Telephone Encounter (Signed)
 Copied from CRM [id number]. Topic: Clinical - Home Health Verbal Orders >> Jun 18, 2023  1:37 PM Robinson DEL wrote: Caller/Agency: [facility]  Callback Number: [phone] Service Requested: Change catheter Frequency: N/A Any new concerns about the patient? Yes, catheter looks like it has blood in it from patient dragging it, also has a hole in bag. Home health nurse is at patients house and wants a verbal to proceed   Florence Pao back and gave verbal orders. She stated that she replaced the foley bag and after doing that the urine is now coming out clearer. Advised patient to continue drinking fluids to get the urine as clear as possible. Also, that she is going to go back out to the patient's home.

## 2023-06-19 NOTE — Telephone Encounter (Signed)
 Please see call details regarding patient catheter bag per CRM note

## 2023-06-20 ENCOUNTER — Telehealth: Payer: Self-pay | Admitting: Internal Medicine

## 2023-06-20 DIAGNOSIS — N136 Pyonephrosis: Secondary | ICD-10-CM | POA: Diagnosis not present

## 2023-06-20 DIAGNOSIS — F319 Bipolar disorder, unspecified: Secondary | ICD-10-CM | POA: Diagnosis not present

## 2023-06-20 DIAGNOSIS — G319 Degenerative disease of nervous system, unspecified: Secondary | ICD-10-CM | POA: Diagnosis not present

## 2023-06-20 DIAGNOSIS — I130 Hypertensive heart and chronic kidney disease with heart failure and stage 1 through stage 4 chronic kidney disease, or unspecified chronic kidney disease: Secondary | ICD-10-CM | POA: Diagnosis not present

## 2023-06-20 DIAGNOSIS — F0283 Dementia in other diseases classified elsewhere, unspecified severity, with mood disturbance: Secondary | ICD-10-CM | POA: Diagnosis not present

## 2023-06-20 DIAGNOSIS — N183 Chronic kidney disease, stage 3 unspecified: Secondary | ICD-10-CM | POA: Diagnosis not present

## 2023-06-20 DIAGNOSIS — R6521 Severe sepsis with septic shock: Secondary | ICD-10-CM | POA: Diagnosis not present

## 2023-06-20 DIAGNOSIS — A4152 Sepsis due to Pseudomonas: Secondary | ICD-10-CM | POA: Diagnosis not present

## 2023-06-20 DIAGNOSIS — I5022 Chronic systolic (congestive) heart failure: Secondary | ICD-10-CM | POA: Diagnosis not present

## 2023-06-20 NOTE — Telephone Encounter (Signed)
Received faxed  document Home Health Certificate (Order ID [id number]), to be filled out by provider. Patient requested to send it back via Fax. Document is located in providers tray at front office.Please advise

## 2023-06-21 ENCOUNTER — Telehealth: Payer: Self-pay | Admitting: Licensed Clinical Social Worker

## 2023-06-21 ENCOUNTER — Telehealth: Payer: Self-pay

## 2023-06-21 ENCOUNTER — Encounter: Payer: Self-pay | Admitting: Licensed Clinical Social Worker

## 2023-06-21 NOTE — Patient Outreach (Signed)
  Care Coordination   06/21/2023 Name: Calvin Escobar MRN: [id number] DOB: 04/19/1951   Care Coordination Outreach Attempts:  An unsuccessful outreach was attempted for an appointment today.  Follow Up Plan:  Additional outreach attempts will be made to offer the patient complex care management information and services.   Encounter Outcome:  No Answer   Care Coordination Interventions:  No, not indicated    Jenel Lucks, LCSW [place]  [facility], [facility] Clinical Social Worker Direct Dial: [phone]  Fax: [phone] Website: [email or web address] 11:53 AM

## 2023-06-21 NOTE — Telephone Encounter (Signed)
Copied from CRM [id number]. Topic: Clinical - Home Health Verbal Orders >> Jun 20, 2023  4:47 PM Fuller Mandril wrote: Caller/Agency: Earlene Plater PT with [facility]  Callback Number: [phone] Service Requested: Physical Therapy continuance  Frequency: 1 week 1 / 2 week 2 / 1 week 1 Any new concerns about the patient? N/A  Spoke with Onalee Hua with [facility] to let him know to move forward with verbal orders that they are requesting.

## 2023-06-24 ENCOUNTER — Encounter: Payer: Self-pay | Admitting: Internal Medicine

## 2023-06-24 ENCOUNTER — Ambulatory Visit (INDEPENDENT_AMBULATORY_CARE_PROVIDER_SITE_OTHER): Payer: Medicare HMO | Admitting: Internal Medicine

## 2023-06-24 VITALS — BP 110/84 | HR 124 | Temp 97.2°F | Ht 73.0 in | Wt 227.2 lb

## 2023-06-24 DIAGNOSIS — I10 Essential (primary) hypertension: Secondary | ICD-10-CM | POA: Diagnosis not present

## 2023-06-24 DIAGNOSIS — N179 Acute kidney failure, unspecified: Secondary | ICD-10-CM | POA: Diagnosis not present

## 2023-06-24 DIAGNOSIS — T839XXA Unspecified complication of genitourinary prosthetic device, implant and graft, initial encounter: Secondary | ICD-10-CM

## 2023-06-24 DIAGNOSIS — R8290 Unspecified abnormal findings in urine: Secondary | ICD-10-CM | POA: Diagnosis not present

## 2023-06-24 HISTORY — DX: Unspecified complication of genitourinary prosthetic device, implant and graft, initial encounter: T83.9XXA

## 2023-06-24 LAB — CBC WITH DIFFERENTIAL/PLATELET
Basophils Absolute: 0.2 10*3/uL — ABNORMAL HIGH (ref 0.0–0.1)
Basophils Relative: 2.6 % (ref 0.0–3.0)
Eosinophils Absolute: 0.5 10*3/uL (ref 0.0–0.7)
Eosinophils Relative: 5.4 % — ABNORMAL HIGH (ref 0.0–5.0)
HCT: 30.2 % — ABNORMAL LOW (ref 39.0–52.0)
Hemoglobin: 9.6 g/dL — ABNORMAL LOW (ref 13.0–17.0)
Lymphocytes Relative: 29.9 % (ref 12.0–46.0)
Lymphs Abs: 2.6 10*3/uL (ref 0.7–4.0)
MCHC: 31.7 g/dL (ref 30.0–36.0)
MCV: 85.8 fL (ref 78.0–100.0)
Monocytes Absolute: 0.8 10*3/uL (ref 0.1–1.0)
Monocytes Relative: 9.2 % (ref 3.0–12.0)
Neutro Abs: 4.7 10*3/uL (ref 1.4–7.7)
Neutrophils Relative %: 52.9 % (ref 43.0–77.0)
Platelets: 256 10*3/uL (ref 150.0–400.0)
RBC: 3.52 Mil/uL — ABNORMAL LOW (ref 4.22–5.81)
RDW: 17.7 % — ABNORMAL HIGH (ref 11.5–15.5)
WBC: 8.8 10*3/uL (ref 4.0–10.5)

## 2023-06-24 LAB — COMPREHENSIVE METABOLIC PANEL
ALT: 9 U/L (ref 0–53)
AST: 19 U/L (ref 0–37)
Albumin: 3.8 g/dL (ref 3.5–5.2)
Alkaline Phosphatase: 61 U/L (ref 39–117)
BUN: 14 mg/dL (ref 6–23)
CO2: 28 meq/L (ref 19–32)
Calcium: 9 mg/dL (ref 8.4–10.5)
Chloride: 102 meq/L (ref 96–112)
Creatinine, Ser: 1.23 mg/dL (ref 0.40–1.50)
GFR: 57.8 mL/min — ABNORMAL LOW (ref >60.00)
Glucose, Bld: 128 mg/dL — ABNORMAL HIGH (ref 70–99)
Potassium: 3.9 meq/L (ref 3.5–5.1)
Sodium: 137 meq/L (ref 135–145)
Total Bilirubin: 0.3 mg/dL (ref 0.2–1.2)
Total Protein: 7.9 g/dL (ref 6.0–8.3)

## 2023-06-24 LAB — URINE CULTURE: Culture: >=100000 — AB

## 2023-06-24 NOTE — Patient Instructions (Addendum)
Continue taking medication(s) as prescribed from hospital, no change: In other words continue not taking blood pressure medication for next 2 weeks. We will follow up then to see if they need resumed.  Continue taking midodrine for now but likely will be able to stop at next visit.  YOUR PLAN:  -FOLEY CATHETER DISCOMFORT: You are experiencing significant discomfort and pain from a Foley catheter placed two weeks ago due to kidney failure from a severe urinary tract infection (UTI). We discussed the risks of removing the catheter, including the low likelihood of needing an emergency room visit if you are unable to urinate. We will remove the Foley catheter today. Please drink plenty of water and try to urinate within 4-6 hours. Expect some burning and irritation, which is normal. We will provide you with a handout on when to go to the ER if you are unable to urinate. A follow-up appointment is scheduled in two weeks.   INSTRUCTIONS:  Please schedule a follow-up appointment in two weeks. We will also order blood work to check your kidney function. If possible, provide a urine sample at the lab.  # Important Instructions After Your Urinary Catheter Removal  ## Call 911 or Go to the Emergency Room Immediately If: - You cannot urinate at all for 6-8 hours after catheter removal - You have severe lower abdominal pain or bloating - You have fever above 101.15F (38.6C)  ## Call Your Doctor or Seek Medical Care If You Have: - Frequent, painful urination that is getting worse - Blood in your urine that is bright red or getting darker - Cloudy, foul-smelling urine - Only passing small amounts of urine (less than a few tablespoons) at a time - Feeling like your bladder isn't emptying completely - Leaking urine between bathroom visits - Stool in your urine - New or worsening back pain  ## What to Expect (Normal): - You should urinate within 4-6 hours after catheter removal - Mild burning or  discomfort with urination for the first day - Urge to urinate more frequently at first - Slight pink tinge to urine for the first day - Need to urinate at night more than usual for 1-2 days  ## Tips for Recovery: - Drink 6-8 glasses of water daily - Urinate as soon as you feel the urge - Take all medications as prescribed - Keep track of how often you urinate - Maintain good hygiene  ## Follow-Up Care: Your next appointment is scheduled for: 2 weeks  If you have any concerns, contact: [phone]

## 2023-06-24 NOTE — Assessment & Plan Note (Signed)
Experiencing significant discomfort and pain from a Foley catheter placed two weeks ago due to kidney failure from a severe pseudomonal UTI, with ? concerns about a potential prostate blockage or fistula contributing to the UTI. There is a strong desire to have the catheter removed. Discussed risks of removal, including the low likelihood of needing an ER visit if unable to urinate, and the possibility of returning to the hospital. He agreed and we did remove the Foley catheter, provide a handout on when to go to the ER if unable to urinate, advise drinking plenty of water, and attempt to urinate within 4-6 hours. Instruct to expect some burning and irritation, which is normal. Schedule follow-up in two weeks.

## 2023-06-24 NOTE — Assessment & Plan Note (Signed)
Continue midodrine for now while holding blood pressure medication(s) - might be able to taper off at nausea with vomiting in 2 weeks.  Encouraged patient to continue holding blood pressure medication(s) and so no change from hospital discharge instruction medication(s) list.

## 2023-06-24 NOTE — Progress Notes (Signed)
==============================  [facility]: [phone]   -- Medical Office Visit --  Patient: Calvin Escobar      Age: 73 y.o.       Sex:  male  Date:   06/24/2023 Today's Healthcare Provider: [name], MD  ==============================   CHIEF COMPLAINT: Hospitalization Follow-up Urinary catheter discomfort.  SUBJECTIVE: Background This is a 73 y.o. male who has Bipolar affective disorder, depressed (HCC); Chronic bronchitis (HCC); Dementia (HCC); Hyperlipidemia; DCM (dilated cardiomyopathy) (HCC); OSA treated with BiPAP; HFrEF (heart failure with reduced ejection fraction) (HCC); Ascending aorta dilatation (HCC); Alcoholism in remission (HCC); Benign prostatic hyperplasia with urinary obstruction; PAC (premature atrial contraction); Primary insomnia; PUD (peptic ulcer disease); Generalized arthritis; Morbid obesity (HCC); Urinary incontinence; Hypertension; Limp; Gynecomastia, male; Chronic back pain; Stage 3 chronic kidney disease (HCC); Normocytic anemia; Tremor; Lack of appetite; Anemia of chronic disease; Syncope; Acute cystitis without hematuria; Diarrhea; AKI (acute kidney injury) (HCC); Hyponatremia; Obesity, Class II, BMI 35-39.9; E coli bacteremia; Septic shock from UTI; Acute respiratory failure with hypoxia (HCC); Thrombocytopenia (HCC); LBBB (left bundle branch block); Colovesical fistula; Urinary tract infection without hematuria; and Sepsis secondary to UTI (HCC) on their problem list.   History of Present Illness The patient, with a history of urinary tract infection (UTI) and kidney failure, presents with persistent sharp pain in the penile region. The pain is described as constant, varying in intensity, and is most comfortable when the patient is lying down. The discomfort increases when sitting or standing, leading the patient to prefer standing positions for relief. The patient reports a history of urinary catheterization,  which he believes may have contributed to the UTI.  The patient also reports a history of a serious UTI that led to a drop in blood pressure and concerns about a potential fistula. However, the patient does not recall any explanation for the continued presence of the Foley catheter, originally placed due to kidney failure from the UTI. The patient expresses a strong desire for the removal of the Foley catheter due to the associated discomfort.  In addition to the urinary issues, the patient describes a past incident of severe constipation, requiring manual removal of hardened fecal matter. The patient reports normal bowel movements since this incident.   Discussed Past Medical History - Urinary tract infection - Kidney failure - ?Prostate issue - ? colovesical Fistula with fecal urine leakage.  Current Outpatient Medications on File Prior to Visit  Medication Sig   ARIPiprazole (ABILIFY) 2 MG tablet Take 2 mg by mouth daily.   ascorbic acid (VITAMIN C) 500 MG tablet Take 500 mg by mouth daily.   busPIRone (BUSPAR) 10 MG tablet Take 10 mg by mouth 3 (three) times daily.   calcium-vitamin D (OSCAL WITH D) 500-5 MG-MCG tablet Take 1 tablet by mouth daily with breakfast.   divalproex (DEPAKOTE ER) 250 MG 24 hr tablet Take 500 mg by mouth at bedtime.   famotidine (PEPCID) 20 MG tablet Take 1 tablet (20 mg total) by mouth 2 (two) times daily.   folic acid (FOLVITE) 1 MG tablet Take 1 tablet (1 mg total) by mouth daily.   gabapentin (NEURONTIN) 300 MG capsule Take 300 mg by mouth at bedtime.   midodrine (PROAMATINE) 5 MG tablet Take 1 tablet (5 mg total) by mouth 3 (three) times daily with meals.   mirtazapine (REMERON) 30 MG tablet Take 30 mg by mouth at bedtime.   Multiple Vitamin (MULTIVITAMIN) tablet Take 1 tablet by  mouth daily.   pravastatin (PRAVACHOL) 20 MG tablet Take 1 tablet (20 mg total) by mouth at bedtime.   primidone (MYSOLINE) 50 MG tablet Take 1 tablet (50 mg total) by mouth 2  (two) times daily.   tamsulosin (FLOMAX) 0.4 MG CAPS capsule TAKE 1 CAPSULE BY MOUTH EVERY DAY   traZODone (DESYREL) 100 MG tablet Take 300 mg by mouth at bedtime.   venlafaxine XR (EFFEXOR-XR) 150 MG 24 hr capsule Take 300 mg by mouth daily with breakfast.   No current facility-administered medications on file prior to visit.   Medications Discontinued During This Encounter  Medication Reason   carvedilol (COREG) 12.5 MG tablet       Objective   Physical Exam     06/24/2023    1:02 PM 06/11/2023   10:16 AM 06/11/2023    5:15 AM  Vitals with BMI  Height 6\' 1"     Weight 227 lbs 3 oz    BMI 29.98    Systolic 110 102 409  Diastolic 84 77 72  Pulse 124 100 101   Wt Readings from Last 10 Encounters:  06/24/23 227 lb 3.2 oz (103.1 kg)  06/07/23 244 lb 7.8 oz (110.9 kg)  05/16/23 241 lb (109.3 kg)  05/08/23 241 lb (109.3 kg)  03/07/23 256 lb 6.4 oz (116.3 kg)  02/25/23 255 lb 3.2 oz (115.8 kg)  01/15/23 258 lb 9.6 oz (117.3 kg)  12/19/22 265 lb 3.2 oz (120.3 kg)  11/21/22 267 lb 3.2 oz (121.2 kg)  07/18/22 279 lb 9.6 oz (126.8 kg)   Vital signs reviewed.  Nursing notes reviewed. Weight trend reviewed. Abnormalities and Problem-Specific physical exam findings:  slight blood during catheter removal, urine was clear.  General Appearance:  No acute distress appreciable.   Well-groomed, healthy-appearing male.  Well proportioned with no abnormal fat distribution.  Good muscle tone. Pulmonary:  Normal work of breathing at rest, no respiratory distress apparent. SpO2: 99 %  Musculoskeletal: All extremities are intact.  Neurological:  Awake, alert, oriented, and engaged.  No obvious focal neurological deficits or cognitive impairments.  Sensorium seems unclouded.   Speech is clear and coherent with logical content. Psychiatric:  Appropriate mood, pleasant and cooperative demeanor, thoughtful and engaged during the exam  Results Procedure: Foley catheter removal Description: The Foley  catheter was removed by deflating the balloon with a syringe and gently pulling the catheter out. Clear urine was drained during the process. A small amount of blood was noted on the tip of the catheter. Informed Consent: The patient was informed about the risks, including the possibility of needing to return to the hospital if unable to urinate. The patient agreed to proceed with the removal despite the risks.  LABS Kidney function: Acute kidney injury secondary to severe urinary tract infection  PATHOLOGY Fistula: Possible colovesical fistula     Results for orders placed or performed in visit on 06/24/23  CBC with Differential/Platelet  Result Value Ref Range   WBC 8.8 4.0 - 10.5 K/uL   RBC 3.52 (L) 4.22 - 5.81 Mil/uL   Hemoglobin 9.6 (L) 13.0 - 17.0 g/dL   HCT 81.1 (L) 91.4 - 78.2 %   MCV 85.8 78.0 - 100.0 fl   MCHC 31.7 30.0 - 36.0 g/dL   RDW 95.6 (H) 21.3 - 08.6 %   Platelets 256.0 150.0 - 400.0 K/uL   Neutrophils Relative % 52.9 43.0 - 77.0 %   Lymphocytes Relative 29.9 12.0 - 46.0 %   Monocytes Relative 9.2  3.0 - 12.0 %   Eosinophils Relative 5.4 (H) 0.0 - 5.0 %   Basophils Relative 2.6 0.0 - 3.0 %   Neutro Abs 4.7 1.4 - 7.7 K/uL   Lymphs Abs 2.6 0.7 - 4.0 K/uL   Monocytes Absolute 0.8 0.1 - 1.0 K/uL   Eosinophils Absolute 0.5 0.0 - 0.7 K/uL   Basophils Absolute 0.2 (H) 0.0 - 0.1 K/uL  Comp Met (CMET)  Result Value Ref Range   Sodium 137 135 - 145 mEq/L   Potassium 3.9 3.5 - 5.1 mEq/L   Chloride 102 96 - 112 mEq/L   CO2 28 19 - 32 mEq/L   Glucose, Bld 128 (H) 70 - 99 mg/dL   BUN 14 6 - 23 mg/dL   Creatinine, Ser 1.61 0.40 - 1.50 mg/dL   Total Bilirubin 0.3 0.2 - 1.2 mg/dL   Alkaline Phosphatase 61 39 - 117 U/L   AST 19 0 - 37 U/L   ALT 9 0 - 53 U/L   Total Protein 7.9 6.0 - 8.3 g/dL   Albumin 3.8 3.5 - 5.2 g/dL   GFR 09.60 (L) >45.40 mL/min   Calcium 9.0 8.4 - 10.5 mg/dL   Office Visit on 98/04/9146  Component Date Value   WBC 06/24/2023 8.8    RBC  06/24/2023 3.52 (L)    Hemoglobin 06/24/2023 9.6 (L)    HCT 06/24/2023 30.2 (L)    MCV 06/24/2023 85.8    MCHC 06/24/2023 31.7    RDW 06/24/2023 17.7 (H)    Platelets 06/24/2023 256.0    Neutrophils Relative % 06/24/2023 52.9    Lymphocytes Relative 06/24/2023 29.9    Monocytes Relative 06/24/2023 9.2    Eosinophils Relative 06/24/2023 5.4 (H)    Basophils Relative 06/24/2023 2.6    Neutro Abs 06/24/2023 4.7    Lymphs Abs 06/24/2023 2.6    Monocytes Absolute 06/24/2023 0.8    Eosinophils Absolute 06/24/2023 0.5    Basophils Absolute 06/24/2023 0.2 (H)    Sodium 06/24/2023 137    Potassium 06/24/2023 3.9    Chloride 06/24/2023 102    CO2 06/24/2023 28    Glucose, Bld 06/24/2023 128 (H)    BUN 06/24/2023 14    Creatinine, Ser 06/24/2023 1.23    Total Bilirubin 06/24/2023 0.3    Alkaline Phosphatase 06/24/2023 61    AST 06/24/2023 19    ALT 06/24/2023 9    Total Protein 06/24/2023 7.9    Albumin 06/24/2023 3.8    GFR 06/24/2023 57.80 (L)    Calcium 06/24/2023 9.0   No results displayed because visit has over 200 results.    No results displayed because visit has over 200 results.    Office Visit on 05/08/2023  Component Date Value   WBC 05/08/2023 6.5    RBC 05/08/2023 3.61 (L)    Platelets 05/08/2023 249.0    Hemoglobin 05/08/2023 10.1 (L)    HCT 05/08/2023 30.9 (L)    MCV 05/08/2023 85.6    MCHC 05/08/2023 32.8    RDW 05/08/2023 15.1    Ferritin 05/08/2023 >1500.0 (H)    Folate 05/08/2023 >24.2    Path Review 05/08/2023     Retic Ct Pct 05/08/2023 2.0    ABS Retic 05/08/2023 73,000    Sed Rate 05/08/2023 60 (H)    Vitamin B-12 05/08/2023 306   Appointment on 03/15/2023  Component Date Value   Area-P 1/2 03/15/2023 5.42    S' Lateral 03/15/2023 3.60    Est EF 03/15/2023 35 - 40%  Office Visit on 01/15/2023  Component Date Value   WBC 01/15/2023 8.5    RBC 01/15/2023 4.28    Hemoglobin 01/15/2023 12.5 (L)    HCT 01/15/2023 38.7 (L)    MCV 01/15/2023 90.3     MCHC 01/15/2023 32.2    RDW 01/15/2023 13.8    Platelets 01/15/2023 187.0    Neutrophils Relative % 01/15/2023 69.9    Lymphocytes Relative 01/15/2023 16.8    Monocytes Relative 01/15/2023 11.3    Eosinophils Relative 01/15/2023 1.4    Basophils Relative 01/15/2023 0.6    Neutro Abs 01/15/2023 5.9    Lymphs Abs 01/15/2023 1.4    Monocytes Absolute 01/15/2023 1.0    Eosinophils Absolute 01/15/2023 0.1    Basophils Absolute 01/15/2023 0.1    Sodium 01/15/2023 138    Potassium 01/15/2023 4.1    Chloride 01/15/2023 97    CO2 01/15/2023 33 (H)    Glucose, Bld 01/15/2023 112 (H)    BUN 01/15/2023 15    Creatinine, Ser 01/15/2023 1.16    Total Bilirubin 01/15/2023 0.5    Alkaline Phosphatase 01/15/2023 54    AST 01/15/2023 15    ALT 01/15/2023 9    Total Protein 01/15/2023 6.9    Albumin 01/15/2023 3.8    GFR 01/15/2023 62.20    Calcium 01/15/2023 9.3    Lipase 01/15/2023 15.0   Office Visit on 11/21/2022  Component Date Value   Sodium 11/21/2022 141    Potassium 11/21/2022 4.5    Chloride 11/21/2022 102    CO2 11/21/2022 31    Glucose, Bld 11/21/2022 106 (H)    BUN 11/21/2022 16    Creatinine, Ser 11/21/2022 1.12    GFR 11/21/2022 64.95    Calcium 11/21/2022 9.2    Vitamin B-12 11/21/2022 305    Folate 11/21/2022 >23.8    TSH 11/21/2022 0.968    Hgb A1c MFr Bld 11/21/2022 5.4   Lab on 08/29/2022  Component Date Value   Valproic Acid Lvl 08/29/2022 29.3 (L)    TSH 08/29/2022 1.42    HIV 1&2 Ab, 4th Generati* 08/29/2022 NON-REACTIVE    Hepatitis C Ab 08/29/2022 NON-REACTIVE    Cholesterol 08/29/2022 138    Triglycerides 08/29/2022 138.0    HDL 08/29/2022 40.80    VLDL 08/29/2022 27.6    LDL Cholesterol 08/29/2022 69    Total CHOL/HDL Ratio 08/29/2022 3    NonHDL 08/29/2022 96.89    Sodium 08/29/2022 140    Potassium 08/29/2022 5.4 No hemolysis seen (H)    Chloride 08/29/2022 101    CO2 08/29/2022 31    Glucose, Bld 08/29/2022 107 (H)    BUN 08/29/2022 20     Creatinine, Ser 08/29/2022 1.14    Total Bilirubin 08/29/2022 0.5    Alkaline Phosphatase 08/29/2022 71    AST 08/29/2022 16    ALT 08/29/2022 13    Total Protein 08/29/2022 7.3    Albumin 08/29/2022 4.3    GFR 08/29/2022 63.69    Calcium 08/29/2022 9.3    WBC 08/29/2022 7.1    RBC 08/29/2022 4.62    Hemoglobin 08/29/2022 13.7    HCT 08/29/2022 41.4    MCV 08/29/2022 89.5    MCHC 08/29/2022 33.2    RDW 08/29/2022 14.5    Platelets 08/29/2022 170.0    Neutrophils Relative % 08/29/2022 69.3    Lymphocytes Relative 08/29/2022 20.9    Monocytes Relative 08/29/2022 6.9    Eosinophils Relative 08/29/2022 2.1    Basophils Relative 08/29/2022 0.8  Neutro Abs 08/29/2022 4.9    Lymphs Abs 08/29/2022 1.5    Monocytes Absolute 08/29/2022 0.5    Eosinophils Absolute 08/29/2022 0.2    Basophils Absolute 08/29/2022 0.1   No image results found. DG CHEST PORT 1 VIEW Result Date: 06/07/2023 CLINICAL DATA:  Shortness of breath. EXAM: PORTABLE CHEST 1 VIEW COMPARISON:  Chest radiographs 06/05/2023, 06/02/2023, 05/12/2023, 01/29/2016; CT chest 03/16/2008 FINDINGS: Cardiac silhouette and mediastinal contours are within normal limits for AP technique. Unchanged chronic benign calcified granuloma within the lateral right midlung. Moderately decreased lung volumes. Mild bilateral likely chronic interstitial thickening is unchanged from prior. No pleural effusion or pneumothorax. No acute skeletal abnormality. IMPRESSION: Moderately decreased lung volumes. No acute cardiopulmonary process. Electronically Signed   By: Neita Garnet M.D.   On: 06/07/2023 11:01   DG CHEST PORT 1 VIEW Result Date: 06/05/2023 CLINICAL DATA:  Shortness of breath. EXAM: PORTABLE CHEST 1 VIEW COMPARISON:  Chest radiograph dated 06/02/2023. FINDINGS: The heart size and mediastinal contours are within normal limits. The lung volumes are low. Both lungs are clear. The visualized skeletal structures are unremarkable. IMPRESSION: Low lung  volumes. No active disease. Electronically Signed   By: Romona Curls M.D.   On: 06/05/2023 12:55   CT ABDOMEN PELVIS W WO CONTRAST Result Date: 06/02/2023 CLINICAL DATA:  73 year old male with history of dementia. Suspected urolithiasis. EXAM: CT ABDOMEN AND PELVIS WITHOUT AND WITH CONTRAST TECHNIQUE: Multidetector CT imaging of the abdomen and pelvis was performed following the standard protocol before and following the bolus administration of intravenous contrast. RADIATION DOSE REDUCTION: This exam was performed according to the departmental dose-optimization program which includes automated exposure control, adjustment of the mA and/or kV according to patient size and/or use of iterative reconstruction technique. CONTRAST:  75mL OMNIPAQUE IOHEXOL 350 MG/ML SOLN COMPARISON:  CT of the abdomen and pelvis 05/16/2023. FINDINGS: Lower chest: Calcified granuloma in the right middle lobe. Atherosclerotic calcifications in the left main, left anterior descending and right coronary arteries. Hepatobiliary: No definite suspicious cystic or solid hepatic lesions are confidently identified on today's noncontrast CT examination. Unenhanced appearance of the gallbladder is unremarkable. Pancreas: No definite pancreatic mass or peripancreatic fluid collections or inflammatory changes are noted on today's noncontrast CT examination. Spleen: Unremarkable. Adrenals/Urinary Tract: There is a large amount of gas present in the nondependent aspect of the urinary bladder. There also appears to be some debris dependently in the urinary bladder, which also contains a small amount of gas (best appreciated on axial image 91 of series 3). Superior aspect of the urinary bladder appears thickened and intimately associated with the adjacent sigmoid colon, centered around what appears to be a large diverticulum, best appreciated on coronal image 56 of series 10. Small amount of gas in the superior wall of the urinary bladder (coronal  image 57 of series 10). Moderate right and mild left hydroureteronephrosis. Small amount of gas within the collecting systems of both kidneys (right greater than left). Unenhanced appearance of the kidneys and bilateral adrenal glands is otherwise unremarkable. Stomach/Bowel: Unenhanced appearance of the stomach is normal. No pathologic dilatation of small bowel or colon. 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 Normal appendix. Vascular/Lymphatic: Atherosclerosis in the abdominal aorta and pelvic vasculature. No lymphadenopathy noted in the abdomen or pelvis. Reproductive: Prostate gland and seminal vesicles are unremarkable in appearance. Other: No significant volume of ascites.  No pneumoperitoneum. Musculoskeletal: There are no aggressive appearing lytic or blastic lesions noted in the visualized  portions of the skeleton. IMPRESSION: 1. 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 Urologic consultation is recommended for further clinical evaluation. 2. Aortic atherosclerosis. Electronically Signed   By: Trudie Reed M.D.   On: 06/02/2023 10:19   CT Head Wo Contrast Result Date: 06/02/2023 CLINICAL DATA:  The patient fell out of bed today sustaining head and neck trauma. EXAM: CT HEAD WITHOUT CONTRAST CT CERVICAL SPINE WITHOUT CONTRAST TECHNIQUE: Multidetector CT imaging of the head and cervical spine was performed following the standard protocol without intravenous contrast. Multiplanar CT image reconstructions of the cervical spine were also generated. RADIATION DOSE REDUCTION: This exam was performed according to the departmental dose-optimization program which includes automated exposure control,  adjustment of the mA and/or kV according to patient size and/or use of iterative reconstruction technique. COMPARISON:  Head CT and cervical spine CT recently both on 05/13/2023. FINDINGS: CT HEAD FINDINGS Brain: There is mild global atrophy, mild atrophic ventriculomegaly and small-vessel disease of the cerebral white matter, chronic senescent mineralization in the basal ganglia. No new abnormality concerning for a cortical based acute infarct, hemorrhage, mass or mass effect is seen and no midline shift. Basal cisterns are clear. There have been no appreciable interval changes. Vascular: No hyperdense vessel or unexpected calcification. Skull: Negative for fractures or focal lesions. Sinuses/Orbits: Patchy membrane thickening again noted in the ethmoid air cells with mild membrane disease in the maxillary sinuses. The frontal and sphenoid sinus, bilateral mastoid air cells, and middle ears are clear. The nasal septum is S shaped. Old lens extractions with otherwise negative orbits. Other: None. CT CERVICAL SPINE FINDINGS Alignment: Reversed cervical lordosis centered at C4-5, stable, with unchanged 3 mm grade 1 C4-5 anterolisthesis. No other listhesis is seen. Bone-on-bone anterior atlantodental joint space loss is also again noted, with osteophytes. Skull base and vertebrae: There is mild osteopenia without evidence of fractures, primary bone lesions or focal pathologic process. There is chronic ankylosis across the C4-5 and C5-6 facet joints. There are anterior bridging osteophytes C4-7. Soft tissues and spinal canal: No prevertebral fluid or swelling. No visible canal hematoma. There are minimal calcifications at the carotid bifurcations. No thyroid or laryngeal mass. Disc levels: There is chronic disc collapse C5-6, C6-7, C7-T1, normal disc heights above C5. There are dorsal disc osteophyte complexes C4-5 through C7-T1, associated with spinal canal stenosis and mild spondylotic cord compression from C4-5  through C6-7. The upper levels do not show significant soft tissue or bony encroachment on the spinal canal. There is multilevel facet joint and uncinate hypertrophy, with foraminal stenosis which is severe on the left at C2-3, bilaterally severe C3-4 through C5-6, bilaterally moderate to severe C6-7. Upper chest: Negative. Other: None. IMPRESSION: 1. No acute intracranial CT findings or depressed skull fractures. 2. Atrophy and small-vessel disease. 3. 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 4. Osteopenia and degenerative change without evidence of cervical fractures. 5. Reversed cervical lordosis with grade 1 C4-5 degenerative anterolisthesis. 6. Multilevel spinal canal and foraminal stenosis. 7. Carotid atherosclerosis. Electronically Signed   By: Almira Bar M.D.   On: 06/02/2023 07:03   CT Cervical Spine Wo Contrast Result Date: 06/02/2023 CLINICAL DATA:  The patient fell out of bed today sustaining head and neck trauma. EXAM: CT HEAD WITHOUT CONTRAST CT CERVICAL SPINE WITHOUT CONTRAST TECHNIQUE: Multidetector CT  imaging of the head and cervical spine was performed following the standard protocol without intravenous contrast. Multiplanar CT image reconstructions of the cervical spine were also generated. RADIATION DOSE REDUCTION: This exam was performed according to the departmental dose-optimization program which includes automated exposure control, adjustment of the mA and/or kV according to patient size and/or use of iterative reconstruction technique. COMPARISON:  Head CT and cervical spine CT recently both on 05/13/2023. FINDINGS: CT HEAD FINDINGS Brain: There is mild global atrophy, mild atrophic ventriculomegaly and small-vessel disease of the cerebral white matter, chronic senescent mineralization in the basal ganglia. No new abnormality concerning for a cortical based acute infarct, hemorrhage, mass or mass effect is seen and no midline shift. Basal cisterns are clear. There have been no  appreciable interval changes. Vascular: No hyperdense vessel or unexpected calcification. Skull: Negative for fractures or focal lesions. Sinuses/Orbits: Patchy membrane thickening again noted in the ethmoid air cells with mild membrane disease in the maxillary sinuses. The frontal and sphenoid sinus, bilateral mastoid air cells, and middle ears are clear. The nasal septum is S shaped. Old lens extractions with otherwise negative orbits. Other: None. CT CERVICAL SPINE FINDINGS Alignment: Reversed cervical lordosis centered at C4-5, stable, with unchanged 3 mm grade 1 C4-5 anterolisthesis. No other listhesis is seen. Bone-on-bone anterior atlantodental joint space loss is also again noted, with osteophytes. Skull base and vertebrae: There is mild osteopenia without evidence of fractures, primary bone lesions or focal pathologic process. There is chronic ankylosis across the C4-5 and C5-6 facet joints. There are anterior bridging osteophytes C4-7. Soft tissues and spinal canal: No prevertebral fluid or swelling. No visible canal hematoma. There are minimal calcifications at the carotid bifurcations. No thyroid or laryngeal mass. Disc levels: There is chronic disc collapse C5-6, C6-7, C7-T1, normal disc heights above C5. There are dorsal disc osteophyte complexes C4-5 through C7-T1, associated with spinal canal stenosis and mild spondylotic cord compression from C4-5 through C6-7. The upper levels do not show significant soft tissue or bony encroachment on the spinal canal. There is multilevel facet joint and uncinate hypertrophy, with foraminal stenosis which is severe on the left at C2-3, bilaterally severe C3-4 through C5-6, bilaterally moderate to severe C6-7. Upper chest: Negative. Other: None. IMPRESSION: 1. No acute intracranial CT findings or depressed skull fractures. 2. Atrophy and small-vessel disease. 3. 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 4. Osteopenia and degenerative change without evidence of  cervical fractures. 5. Reversed cervical lordosis with grade 1 C4-5 degenerative anterolisthesis. 6. Multilevel spinal canal and foraminal stenosis. 7. Carotid atherosclerosis. Electronically Signed   By: Almira Bar M.D.   On: 06/02/2023 07:03   DG Chest Port 1 View Result Date: 06/02/2023 CLINICAL DATA:  73 year old male with possible sepsis. Fall, altered mental status. EXAM: PORTABLE CHEST 1 VIEW COMPARISON:  Portable chest 05/12/2023 and earlier. FINDINGS: Portable AP semi upright view at 0546 hours. Stable low lung volumes. More rotated to the left now. Stable cardiac size and mediastinal contours. Stable lung volumes and ventilation. Calcified right midlung granuloma (no follow-up imaging recommended). No pneumothorax, pleural effusion, acute pulmonary opacity. Paucity of bowel gas the visible abdomen. No acute osseous abnormality identified. IMPRESSION: Chronically Low lung volumes. No acute cardiopulmonary abnormality or acute traumatic injury identified. Electronically Signed   By: Odessa Fleming M.D.   On: 06/02/2023 05:59   CT RENAL STONE STUDY Result Date: 05/16/2023 CLINICAL DATA:  Flank pain, bacteremia, UTI EXAM: CT ABDOMEN AND PELVIS WITHOUT CONTRAST TECHNIQUE: Multidetector CT imaging of the  abdomen and pelvis was performed following the standard protocol without IV contrast. RADIATION DOSE REDUCTION: This exam was performed according to the departmental dose-optimization program which includes automated exposure control, adjustment of the mA and/or kV according to patient size and/or use of iterative reconstruction technique. COMPARISON:  09/12/2016 FINDINGS: Lower chest: No acute abnormality. Relative hypoattenuation of the cardiac blood pool indicative of anemia. Hepatobiliary: Unremarkable unenhanced appearance of the liver. No focal liver lesion identified. Gallbladder within normal limits. No hyperdense gallstone. No biliary dilatation. Pancreas: Unremarkable. No pancreatic ductal  dilatation or surrounding inflammatory changes. Spleen: Normal in size without focal abnormality. Adrenals/Urinary Tract: Unremarkable adrenal glands. Mild left hydroureteronephrosis. No renal or ureteral calculi. No solid renal lesion. No right-sided hydronephrosis. There is wall thickening of the urinary bladder, most pronounced posteriorly. Moderate volume of air within the bladder lumen. Stomach/Bowel: Stomach is within normal limits. Appendix appears normal. No dilated loops of bowel. Extensive colonic diverticulosis. There is mild fat stranding adjacent to the proximal sigmoid colon. There is mild long segment wall thickening of the sigmoid colon. No focally inflamed diverticulum is identified. Vascular/Lymphatic: Aortic atherosclerosis. No enlarged abdominal or pelvic lymph nodes. Reproductive: Mildly enlarged prostate gland. Other: No free fluid. No abdominopelvic fluid collection. No pneumoperitoneum. No abdominal wall hernia. Musculoskeletal: No acute or significant osseous findings. IMPRESSION: 1. Wall thickening of the urinary bladder, most pronounced posteriorly. Moderate volume of air within the bladder lumen. Findings are suggestive of cystitis. Correlate with urinalysis. 2. Mild left hydroureteronephrosis. No renal or ureteral calculi. 3. Extensive colonic diverticulosis. Mild fat stranding adjacent to the proximal sigmoid colon. There is mild long segment wall thickening of the sigmoid colon, which may reflect sequela of chronic diverticulitis. Early changes of diverticulitis is not excluded. Correlate with patient's symptoms. 4. Mildly enlarged prostate gland. 5. Relative hypoattenuation of the cardiac blood pool indicative of anemia. 6. Aortic atherosclerosis (ICD10-I70.0). Electronically Signed   By: Duanne Guess D.O.   On: 05/16/2023 11:33   ECHOCARDIOGRAM COMPLETE Result Date: 05/13/2023    ECHOCARDIOGRAM REPORT   Patient Name:   EUSEBIO STUTZMAN Date of Exam: 05/13/2023 Medical Rec #:   [id number]   Height:       66.0 in Accession #:    [id number]  Weight:       241.0 lb Date of Birth:  Dec 08, 1950   BSA:          2.165 m Patient Age:    72 years    BP:           126/73 mmHg Patient Gender: M           HR:           77 bpm. Exam Location:  Inpatient Procedure: 2D Echo, Cardiac Doppler, Color Doppler and Intracardiac            Opacification Agent Indications:    Syncope  History:        Patient has prior history of Echocardiogram examinations, most                 recent 03/15/2023. Risk Factors:Hypertension, Dyslipidemia and                 Sleep Apnea.  Sonographer:    Karma Ganja Referring Phys: [id number] RATHORE  Sonographer Comments: Technically difficult study due to poor echo windows. Image acquisition challenging due to respiratory motion. IMPRESSIONS  1. Abnormal septal motion. Left ventricular ejection fraction, by estimation, is 50 to 55%. The left ventricle has  low normal function. The left ventricle has no regional wall motion abnormalities. There is mild left ventricular hypertrophy. Left ventricular diastolic parameters were normal.  2. Right ventricular systolic function is normal. The right ventricular size is normal. There is normal pulmonary artery systolic pressure.  3. The mitral valve is normal in structure. No evidence of mitral valve regurgitation. No evidence of mitral stenosis.  4. The aortic valve is tricuspid. There is moderate calcification of the aortic valve. There is moderate thickening of the aortic valve. Aortic valve regurgitation is not visualized. Aortic valve sclerosis is present, with no evidence of aortic valve stenosis.  5. The inferior vena cava is normal in size with greater than 50% respiratory variability, suggesting right atrial pressure of 3 mmHg. FINDINGS  Left Ventricle: Abnormal septal motion. Left ventricular ejection fraction, by estimation, is 50 to 55%. The left ventricle has low normal function. The left ventricle has no regional wall  motion abnormalities. The left ventricular internal cavity size was normal in size. There is mild left ventricular hypertrophy. Left ventricular diastolic parameters were normal. Right Ventricle: The right ventricular size is normal. No increase in right ventricular wall thickness. Right ventricular systolic function is normal. There is normal pulmonary artery systolic pressure. The tricuspid regurgitant velocity is 2.74 m/s, and  with an assumed right atrial pressure of 3 mmHg, the estimated right ventricular systolic pressure is 33.0 mmHg. Left Atrium: Left atrial size was normal in size. Right Atrium: Right atrial size was normal in size. Pericardium: There is no evidence of pericardial effusion. Mitral Valve: The mitral valve is normal in structure. No evidence of mitral valve regurgitation. No evidence of mitral valve stenosis. Tricuspid Valve: The tricuspid valve is normal in structure. Tricuspid valve regurgitation is not demonstrated. No evidence of tricuspid stenosis. Aortic Valve: The aortic valve is tricuspid. There is moderate calcification of the aortic valve. There is moderate thickening of the aortic valve. Aortic valve regurgitation is not visualized. Aortic valve sclerosis is present, with no evidence of aortic valve stenosis. Aortic valve mean gradient measures 4.0 mmHg. Aortic valve peak gradient measures 6.4 mmHg. Aortic valve area, by VTI measures 2.71 cm. Pulmonic Valve: The pulmonic valve was normal in structure. Pulmonic valve regurgitation is not visualized. No evidence of pulmonic stenosis. Aorta: The aortic root is normal in size and structure. Venous: The inferior vena cava is normal in size with greater than 50% respiratory variability, suggesting right atrial pressure of 3 mmHg. IAS/Shunts: No atrial level shunt detected by color flow Doppler.  LEFT VENTRICLE PLAX 2D LVIDd:         5.00 cm      Diastology LVIDs:         3.90 cm      LV e' medial:    7.29 cm/s LV PW:         1.20 cm       LV E/e' medial:  9.3 LV IVS:        1.30 cm      LV e' lateral:   15.10 cm/s LVOT diam:     2.10 cm      LV E/e' lateral: 4.5 LV SV:         64 LV SV Index:   29 LVOT Area:     3.46 cm  LV Volumes (MOD) LV vol d, MOD A2C: 113.0 ml LV vol d, MOD A4C: 139.0 ml LV vol s, MOD A2C: 56.9 ml LV vol s, MOD A4C: 67.0 ml LV SV  MOD A2C:     56.1 ml LV SV MOD A4C:     139.0 ml LV SV MOD BP:      64.9 ml RIGHT VENTRICLE RV Basal diam:  3.70 cm RV S prime:     19.10 cm/s TAPSE (M-mode): 3.2 cm LEFT ATRIUM             Index        RIGHT ATRIUM           Index LA diam:        3.70 cm 1.71 cm/m   RA Area:     15.00 cm LA Vol (A2C):   74.3 ml 34.32 ml/m  RA Volume:   31.00 ml  14.32 ml/m LA Vol (A4C):   52.9 ml 24.44 ml/m LA Biplane Vol: 67.8 ml 31.32 ml/m  AORTIC VALVE AV Area (Vmax):    2.75 cm AV Area (Vmean):   2.66 cm AV Area (VTI):     2.71 cm AV Vmax:           126.00 cm/s AV Vmean:          86.900 cm/s AV VTI:            0.235 m AV Peak Grad:      6.4 mmHg AV Mean Grad:      4.0 mmHg LVOT Vmax:         100.00 cm/s LVOT Vmean:        66.700 cm/s LVOT VTI:          0.184 m LVOT/AV VTI ratio: 0.78  AORTA Ao Root diam: 3.80 cm Ao Asc diam:  3.90 cm MITRAL VALVE               TRICUSPID VALVE MV Area (PHT): 3.93 cm    TR Peak grad:   30.0 mmHg MV Decel Time: 193 msec    TR Vmax:        274.00 cm/s MV E velocity: 67.90 cm/s MV A velocity: 83.90 cm/s  SHUNTS MV E/A ratio:  0.81        Systemic VTI:  0.18 m                            Systemic Diam: 2.10 cm Charlton Haws MD Electronically signed by Charlton Haws MD Signature Date/Time: 05/13/2023/10:16:49 AM    Final    CT HEAD WO CONTRAST ( ) Result Date: 05/13/2023 CLINICAL DATA:  Unwitnessed fall, altered mental status EXAM: CT HEAD WITHOUT CONTRAST CT CERVICAL SPINE WITHOUT CONTRAST TECHNIQUE: Multidetector CT imaging of the head and cervical spine was performed following the standard protocol without intravenous contrast. Multiplanar CT image reconstructions of the  cervical spine were also generated. RADIATION DOSE REDUCTION: This exam was performed according to the departmental dose-optimization program which includes automated exposure control, adjustment of the mA and/or kV according to patient size and/or use of iterative reconstruction technique. COMPARISON:  CT head dated 01/29/2016 FINDINGS: CT HEAD FINDINGS Brain: No evidence of acute infarction, hemorrhage, hydrocephalus, extra-axial collection or mass lesion/mass effect. Global cortical atrophy. Mild subcortical white matter and periventricular small vessel ischemic changes. Vascular: No hyperdense vessel or unexpected calcification. Skull: Normal. Negative for fracture or focal lesion. Sinuses/Orbits: The visualized paranasal sinuses are essentially clear. The mastoid air cells are unopacified. Other: None. CT CERVICAL SPINE FINDINGS Alignment: Loss of the normal cervical lordosis. Skull base and vertebrae: No acute fracture. No primary bone lesion or focal  pathologic process. Soft tissues and spinal canal: No prevertebral fluid or swelling. No visible canal hematoma. Disc levels: Mild degenerative changes of the mid/lower cervical spine. Spinal canal is patent. Upper chest: Negative. Other: None. IMPRESSION: No acute intracranial abnormality. Atrophy with small vessel ischemic changes. No traumatic injury to the cervical spine. Mild degenerative changes. Electronically Signed   By: Charline Bills M.D.   On: 05/13/2023 00:24   CT CERVICAL SPINE WO CONTRAST Result Date: 05/13/2023 CLINICAL DATA:  Unwitnessed fall, altered mental status EXAM: CT HEAD WITHOUT CONTRAST CT CERVICAL SPINE WITHOUT CONTRAST TECHNIQUE: Multidetector CT imaging of the head and cervical spine was performed following the standard protocol without intravenous contrast. Multiplanar CT image reconstructions of the cervical spine were also generated. RADIATION DOSE REDUCTION: This exam was performed according to the departmental  dose-optimization program which includes automated exposure control, adjustment of the mA and/or kV according to patient size and/or use of iterative reconstruction technique. COMPARISON:  CT head dated 01/29/2016 FINDINGS: CT HEAD FINDINGS Brain: No evidence of acute infarction, hemorrhage, hydrocephalus, extra-axial collection or mass lesion/mass effect. Global cortical atrophy. Mild subcortical white matter and periventricular small vessel ischemic changes. Vascular: No hyperdense vessel or unexpected calcification. Skull: Normal. Negative for fracture or focal lesion. Sinuses/Orbits: The visualized paranasal sinuses are essentially clear. The mastoid air cells are unopacified. Other: None. CT CERVICAL SPINE FINDINGS Alignment: Loss of the normal cervical lordosis. Skull base and vertebrae: No acute fracture. No primary bone lesion or focal pathologic process. Soft tissues and spinal canal: No prevertebral fluid or swelling. No visible canal hematoma. Disc levels: Mild degenerative changes of the mid/lower cervical spine. Spinal canal is patent. Upper chest: Negative. Other: None. IMPRESSION: No acute intracranial abnormality. Atrophy with small vessel ischemic changes. No traumatic injury to the cervical spine. Mild degenerative changes. Electronically Signed   By: Charline Bills M.D.   On: 05/13/2023 00:24   DG Chest Port 1 View Result Date: 05/13/2023 CLINICAL DATA:  Un witnessed fall, initial encounter EXAM: PORTABLE CHEST 1 VIEW COMPARISON:  01/29/16 FINDINGS: Cardiac shadow is mildly prominent but stable. Lungs are hypoinflated. Calcified granuloma is noted in the right mid lung stable from the prior exam. No acute bony abnormality is seen. IMPRESSION: Changes of prior granulomatous disease. No acute abnormality noted. Electronically Signed   By: Alcide Clever M.D.   On: 05/13/2023 00:03  CT ABDOMEN PELVIS W WO CONTRAST Result Date: 06/02/2023 CLINICAL DATA:  73 year old male with history of  dementia. Suspected urolithiasis. EXAM: CT ABDOMEN AND PELVIS WITHOUT AND WITH CONTRAST TECHNIQUE: Multidetector CT imaging of the abdomen and pelvis was performed following the standard protocol before and following the bolus administration of intravenous contrast. RADIATION DOSE REDUCTION: This exam was performed according to the departmental dose-optimization program which includes automated exposure control, adjustment of the mA and/or kV according to patient size and/or use of iterative reconstruction technique. CONTRAST:  75mL OMNIPAQUE IOHEXOL 350 MG/ML SOLN COMPARISON:  CT of the abdomen and pelvis 05/16/2023. FINDINGS: Lower chest: Calcified granuloma in the right middle lobe. Atherosclerotic calcifications in the left main, left anterior descending and right coronary arteries. Hepatobiliary: No definite suspicious cystic or solid hepatic lesions are confidently identified on today's noncontrast CT examination. Unenhanced appearance of the gallbladder is unremarkable. Pancreas: No definite pancreatic mass or peripancreatic fluid collections or inflammatory changes are noted on today's noncontrast CT examination. Spleen: Unremarkable. Adrenals/Urinary Tract: There is a large amount of gas present in the nondependent aspect of the urinary bladder. There also  appears to be some debris dependently in the urinary bladder, which also contains a small amount of gas (best appreciated on axial image 91 of series 3). Superior aspect of the urinary bladder appears thickened and intimately associated with the adjacent sigmoid colon, centered around what appears to be a large diverticulum, best appreciated on coronal image 56 of series 10. Small amount of gas in the superior wall of the urinary bladder (coronal image 57 of series 10). Moderate right and mild left hydroureteronephrosis. Small amount of gas within the collecting systems of both kidneys (right greater than left). Unenhanced appearance of the kidneys and  bilateral adrenal glands is otherwise unremarkable. Stomach/Bowel: Unenhanced appearance of the stomach is normal. No pathologic dilatation of small bowel or colon. 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 Normal appendix. Vascular/Lymphatic: Atherosclerosis in the abdominal aorta and pelvic vasculature. No lymphadenopathy noted in the abdomen or pelvis. Reproductive: Prostate gland and seminal vesicles are unremarkable in appearance. Other: No significant volume of ascites.  No pneumoperitoneum. Musculoskeletal: There are no aggressive appearing lytic or blastic lesions noted in the visualized portions of the skeleton. IMPRESSION: 1. 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 Urologic consultation is recommended for further clinical evaluation. 2. Aortic atherosclerosis. Electronically Signed   By: Trudie Reed M.D.   On: 06/02/2023 10:19   CT Head Wo Contrast Result Date: 06/02/2023 CLINICAL DATA:  The patient fell out of bed today sustaining head and neck trauma. EXAM: CT HEAD WITHOUT CONTRAST CT CERVICAL SPINE WITHOUT CONTRAST TECHNIQUE: Multidetector CT imaging of the head and cervical spine was performed following the standard protocol without intravenous contrast. Multiplanar CT image reconstructions of the cervical spine were also generated. RADIATION DOSE REDUCTION: This exam was performed according to the departmental dose-optimization program which includes automated exposure control, adjustment of the mA and/or kV according to patient size and/or use of iterative reconstruction technique. COMPARISON:  Head CT and cervical spine CT recently both on 05/13/2023. FINDINGS: CT HEAD FINDINGS Brain:  There is mild global atrophy, mild atrophic ventriculomegaly and small-vessel disease of the cerebral white matter, chronic senescent mineralization in the basal ganglia. No new abnormality concerning for a cortical based acute infarct, hemorrhage, mass or mass effect is seen and no midline shift. Basal cisterns are clear. There have been no appreciable interval changes. Vascular: No hyperdense vessel or unexpected calcification. Skull: Negative for fractures or focal lesions. Sinuses/Orbits: Patchy membrane thickening again noted in the ethmoid air cells with mild membrane disease in the maxillary sinuses. The frontal and sphenoid sinus, bilateral mastoid air cells, and middle ears are clear. The nasal septum is S shaped. Old lens extractions with otherwise negative orbits. Other: None. CT CERVICAL SPINE FINDINGS Alignment: Reversed cervical lordosis centered at C4-5, stable, with unchanged 3 mm grade 1 C4-5 anterolisthesis. No other listhesis is seen. Bone-on-bone anterior atlantodental joint space loss is also again noted, with osteophytes. Skull base and vertebrae: There is mild osteopenia without evidence of fractures, primary bone lesions or focal pathologic process. There is chronic ankylosis across the C4-5 and C5-6 facet joints. There are anterior bridging osteophytes C4-7. Soft tissues and spinal canal: No prevertebral fluid or swelling. No visible canal hematoma. There are minimal calcifications at the carotid bifurcations. No thyroid or laryngeal mass.  Disc levels: There is chronic disc collapse C5-6, C6-7, C7-T1, normal disc heights above C5. There are dorsal disc osteophyte complexes C4-5 through C7-T1, associated with spinal canal stenosis and mild spondylotic cord compression from C4-5 through C6-7. The upper levels do not show significant soft tissue or bony encroachment on the spinal canal. There is multilevel facet joint and uncinate hypertrophy, with foraminal stenosis which is severe on the left  at C2-3, bilaterally severe C3-4 through C5-6, bilaterally moderate to severe C6-7. Upper chest: Negative. Other: None. IMPRESSION: 1. No acute intracranial CT findings or depressed skull fractures. 2. Atrophy and small-vessel disease. 3. 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 4. Osteopenia and degenerative change without evidence of cervical fractures. 5. Reversed cervical lordosis with grade 1 C4-5 degenerative anterolisthesis. 6. Multilevel spinal canal and foraminal stenosis. 7. Carotid atherosclerosis. Electronically Signed   By: Almira Bar M.D.   On: 06/02/2023 07:03   CT Cervical Spine Wo Contrast Result Date: 06/02/2023 CLINICAL DATA:  The patient fell out of bed today sustaining head and neck trauma. EXAM: CT HEAD WITHOUT CONTRAST CT CERVICAL SPINE WITHOUT CONTRAST TECHNIQUE: Multidetector CT imaging of the head and cervical spine was performed following the standard protocol without intravenous contrast. Multiplanar CT image reconstructions of the cervical spine were also generated. RADIATION DOSE REDUCTION: This exam was performed according to the departmental dose-optimization program which includes automated exposure control, adjustment of the mA and/or kV according to patient size and/or use of iterative reconstruction technique. COMPARISON:  Head CT and cervical spine CT recently both on 05/13/2023. FINDINGS: CT HEAD FINDINGS Brain: There is mild global atrophy, mild atrophic ventriculomegaly and small-vessel disease of the cerebral white matter, chronic senescent mineralization in the basal ganglia. No new abnormality concerning for a cortical based acute infarct, hemorrhage, mass or mass effect is seen and no midline shift. Basal cisterns are clear. There have been no appreciable interval changes. Vascular: No hyperdense vessel or unexpected calcification. Skull: Negative for fractures or focal lesions. Sinuses/Orbits: Patchy membrane thickening again noted in the ethmoid air cells  with mild membrane disease in the maxillary sinuses. The frontal and sphenoid sinus, bilateral mastoid air cells, and middle ears are clear. The nasal septum is S shaped. Old lens extractions with otherwise negative orbits. Other: None. CT CERVICAL SPINE FINDINGS Alignment: Reversed cervical lordosis centered at C4-5, stable, with unchanged 3 mm grade 1 C4-5 anterolisthesis. No other listhesis is seen. Bone-on-bone anterior atlantodental joint space loss is also again noted, with osteophytes. Skull base and vertebrae: There is mild osteopenia without evidence of fractures, primary bone lesions or focal pathologic process. There is chronic ankylosis across the C4-5 and C5-6 facet joints. There are anterior bridging osteophytes C4-7. Soft tissues and spinal canal: No prevertebral fluid or swelling. No visible canal hematoma. There are minimal calcifications at the carotid bifurcations. No thyroid or laryngeal mass. Disc levels: There is chronic disc collapse C5-6, C6-7, C7-T1, normal disc heights above C5. There are dorsal disc osteophyte complexes C4-5 through C7-T1, associated with spinal canal stenosis and mild spondylotic cord compression from C4-5 through C6-7. The upper levels do not show significant soft tissue or bony encroachment on the spinal canal. There is multilevel facet joint and uncinate hypertrophy, with foraminal stenosis which is severe on the left at C2-3, bilaterally severe C3-4 through C5-6, bilaterally moderate to severe C6-7. Upper chest: Negative. Other: None. IMPRESSION: 1. No acute intracranial CT findings or depressed skull fractures. 2. Atrophy and small-vessel disease. 3. Sinus membrane disease without fluid  levels. 4. Osteopenia and degenerative change without evidence of cervical fractures. 5. Reversed cervical lordosis with grade 1 C4-5 degenerative anterolisthesis. 6. Multilevel spinal canal and foraminal stenosis. 7. Carotid atherosclerosis. Electronically Signed   By: Almira Bar  M.D.   On: 06/02/2023 07:03   DG Chest Port 1 View Result Date: 06/02/2023 CLINICAL DATA:  73 year old male with possible sepsis. Fall, altered mental status. EXAM: PORTABLE CHEST 1 VIEW COMPARISON:  Portable chest 05/12/2023 and earlier. FINDINGS: Portable AP semi upright view at 0546 hours. Stable low lung volumes. More rotated to the left now. Stable cardiac size and mediastinal contours. Stable lung volumes and ventilation. Calcified right midlung granuloma (no follow-up imaging recommended). No pneumothorax, pleural effusion, acute pulmonary opacity. Paucity of bowel gas the visible abdomen. No acute osseous abnormality identified. IMPRESSION: Chronically Low lung volumes. No acute cardiopulmonary abnormality or acute traumatic injury identified. Electronically Signed   By: Odessa Fleming M.D.   On: 06/02/2023 05:59       Assessment & Plan AKI (acute kidney injury) (HCC)  Abnormal urinalysis  Urinary catheter complication, initial encounter [facility]) Experiencing significant discomfort and pain from a Foley catheter placed two weeks ago due to kidney failure from a severe pseudomonal UTI, with ? concerns about a potential prostate blockage or fistula contributing to the UTI. There is a strong desire to have the catheter removed. Discussed risks of removal, including the low likelihood of needing an ER visit if unable to urinate, and the possibility of returning to the hospital. He agreed and we did remove the Foley catheter, provide a handout on when to go to the ER if unable to urinate, advise drinking plenty of water, and attempt to urinate within 4-6 hours. Instruct to expect some burning and irritation, which is normal. Schedule follow-up in two weeks. Primary hypertension Continue midodrine for now while holding blood pressure medication(s) - might be able to taper off at nausea with vomiting in 2 weeks.  Encouraged patient to continue holding blood pressure medication(s) and so no change from  hospital discharge instruction medication(s) list.      Orders Placed During this Encounter:   Orders Placed This Encounter  Procedures   CBC with Differential/Platelet   Comp Met (CMET)   No orders of the defined types were placed in this encounter.  # Important Instructions After Your Urinary Catheter Removal  ## Call 911 or Go to the Emergency Room Immediately If: - You cannot urinate at all for 6-8 hours after catheter removal - You have severe lower abdominal pain or bloating - You have fever above 101.29F (38.6C)  ## Call Your Doctor or Seek Medical Care If You Have: - Frequent, painful urination that is getting worse - Blood in your urine that is bright red or getting darker - Cloudy, foul-smelling urine - Only passing small amounts of urine (less than a few tablespoons) at a time - Feeling like your bladder isn't emptying completely - Leaking urine between bathroom visits - Stool in your urine - New or worsening back pain  ## What to Expect (Normal): - You should urinate within 4-6 hours after catheter removal - Mild burning or discomfort with urination for the first day - Urge to urinate more frequently at first - Slight pink tinge to urine for the first day - Need to urinate at night more than usual for 1-2 days  ## Tips for Recovery: - Drink 6-8 glasses of water daily - Urinate as soon as you feel the urge -  Take all medications as prescribed - Keep track of how often you urinate - Maintain good hygiene  ## Follow-Up Care: Your next appointment is scheduled for: 2 weeks  If you have any concerns, [phone]    This document was synthesized by artificial intelligence ([employer]bridge) using HIPAA-compliant recording of the clinical interaction;   We discussed the use of AI scribe software for clinical note transcription with the patient, who gave verbal consent to proceed.    Additional Info: This encounter employed state-of-the-art, real-time,  collaborative documentation. The patient actively reviewed and assisted in updating their electronic medical record on a shared screen, ensuring transparency and facilitating joint problem-solving for the problem list, overview, and plan. This approach promotes accurate, informed care. The treatment plan was discussed and reviewed in detail, including medication safety, potential side effects, and all patient questions. We confirmed understanding and comfort with the plan. Follow-up instructions were established, including contacting the office for any concerns, returning if symptoms worsen, persist, or new symptoms develop, and precautions for potential emergency department visits.

## 2023-06-25 ENCOUNTER — Ambulatory Visit: Payer: Medicare HMO | Admitting: Gastroenterology

## 2023-06-25 NOTE — Telephone Encounter (Signed)
Patient was seen for an OV with Dr. Jon Billings yesterday.

## 2023-06-25 NOTE — Telephone Encounter (Signed)
Forms were signed dated and faxed back on 1/17.

## 2023-06-25 NOTE — Progress Notes (Deleted)
Chief Complaint: Hospital follow-up of colovesical fistula Primary GI MD: Dr. Myrtie Neither  HPI: 73 year old male history of hypertension, hyperlipidemia, BPH, HFrEF, dilated ascending aorta, bipolar disorder, OSA on CPAP, tremor, presents for evaluation of hospital follow-up.  Recently admitted 06/02/2023 to 06/11/2023 for septic shock from UTI.  He initially presented to the hospital for confusion and fall early December 2024 and was treated for severe sepsis secondary to E. coli bacteremia from UTI.  After being discharged she was having worsening tremors and then a fall which brought him back to the hospital.  Patient met SIRS criteria and was admitted, he was transferred to ICU due to shock 12/28 requiring IV vasopressors briefly.  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  General surgery and urology were consulted.  Patient continued to have significant urine output from his rectum as a result of his fistula.  General surgery recommended conservative management.  Patient did have a recent cystoscopy which did not show any evidence of colovesicular fistula.  Surgery recommended prior to any colectomy for patient to undergo a colonoscopy and obtain cardiac clearance.  Patient was then arranged with outpatient follow-up with GI for consideration of preop colonoscopy.  Echocardiogram December 2024 showed EF 50 to 55%.  Patient was last seen by Dr. Myrtie Neither in 2018.  Cologuard in 2022 was negative   PREVIOUS GI WORKUP   Colonoscopy 09/2010 Diverticulosis, otherwise normal colonoscopy  Past Medical History:  Diagnosis Date   Allergy seasonal   Ascending aorta dilatation (HCC)    38 mm by 2D echo 04/2021   Cancer (HCC) skin   DCM (dilated cardiomyopathy) (HCC)    nonischemic with normal coronary arteries at cath  06/2019.  EF 35 to 40% on echo 04/2021   Dementia (HCC)    Depression    Hyperlipidemia    Lithium toxicity 01/29/2016   OSA treated with BiPAP    SKIN CANCER, HX OF 05/10/2007   Facial left check and forehead and r shoulder  F/w derm   Urinary dribbling 03/14/2022    Past Surgical History:  Procedure Laterality Date   COLONOSCOPY     RIGHT/LEFT HEART CATH AND CORONARY ANGIOGRAPHY N/A 07/03/2019   Procedure: RIGHT/LEFT HEART CATH AND CORONARY ANGIOGRAPHY;  Surgeon: Dolores Patty, MD;  Location: [facility];  Service: Cardiovascular;  Laterality: N/A;    Current Outpatient Medications  Medication Sig Dispense Refill   ARIPiprazole (ABILIFY) 2 MG tablet Take 2 mg by mouth daily.     ascorbic acid (VITAMIN C) 500 MG tablet Take 500 mg by mouth daily.     busPIRone (BUSPAR) 10 MG tablet Take 10 mg by mouth 3 (three) times daily.     calcium-vitamin D (OSCAL WITH D) 500-5 MG-MCG tablet Take 1 tablet by mouth daily with breakfast.     divalproex (DEPAKOTE ER) 250 MG 24 hr tablet Take 500 mg by mouth at bedtime.     famotidine (PEPCID) 20 MG tablet Take 1 tablet (20 mg total) by mouth 2 (two) times daily. 60 tablet 0   folic acid (FOLVITE) 1 MG tablet Take 1 tablet (1 mg total) by mouth daily. 30 tablet 12   gabapentin (NEURONTIN) 300 MG capsule Take 300 mg by mouth at bedtime.     midodrine (PROAMATINE) 5 MG tablet Take 1 tablet (5  mg total) by mouth 3 (three) times daily with meals. 90 tablet 2   mirtazapine (REMERON) 30 MG tablet Take 30 mg by mouth at bedtime.     Multiple Vitamin (MULTIVITAMIN) tablet Take 1 tablet by mouth daily.     pravastatin (PRAVACHOL) 20 MG tablet Take 1 tablet (20 mg total) by mouth at bedtime. 90 tablet 3   primidone (MYSOLINE) 50 MG tablet Take 1 tablet (50 mg total) by mouth 2 (two) times daily.     tamsulosin (FLOMAX) 0.4 MG CAPS capsule TAKE 1 CAPSULE BY MOUTH EVERY DAY 90 capsule 1   traZODone (DESYREL) 100 MG tablet Take 300 mg by mouth at  bedtime.     venlafaxine XR (EFFEXOR-XR) 150 MG 24 hr capsule Take 300 mg by mouth daily with breakfast.     No current facility-administered medications for this visit.    Allergies as of 06/25/2023 - Review Complete 06/24/2023  Allergen Reaction Noted   Albuterol Palpitations 06/05/2023    Family History  Problem Relation Age of Onset   Hypertension Mother    Cancer Father    Bone cancer Sister     Social History   Socioeconomic History   Marital status: Married    Spouse name: Not on file   Number of children: Not on file   Years of education: Not on file   Highest education level: Not on file  Occupational History   Not on file  Tobacco Use   Smoking status: Never   Smokeless tobacco: Never  Vaping Use   Vaping status: Never Used  Substance and Sexual Activity   Alcohol use: Yes    Alcohol/week: 14.0 standard drinks of alcohol    Types: 14 Cans of beer per week   Drug use: No   Sexual activity: Not Currently  Other Topics Concern   Not on file  Social History Narrative   Not on file   Social Drivers of Health   Financial Resource Strain: Low Risk  (10/03/2022)   Overall Financial Resource Strain (CARDIA)    Difficulty of Paying Living Expenses: Not hard at all  Food Insecurity: No Food Insecurity (06/02/2023)   Hunger Vital Sign    Worried About Running Out of Food in the Last Year: Never true    Ran Out of Food in the Last Year: Never true  Transportation Needs: No Transportation Needs (06/02/2023)   PRAPARE - Administrator, Civil Service (Medical): No    Lack of Transportation (Non-Medical): No  Physical Activity: Inactive (05/04/2022)   Exercise Vital Sign    Days of Exercise per Week: 0 days    Minutes of Exercise per Session: 0 min  Stress: Stress Concern Present (11/30/2022)   [employer] of Occupational Health - Occupational Stress Questionnaire    Feeling of Stress : Rather much  Social Connections: Moderately Isolated  (06/03/2023)   Social Connection and Isolation Panel [NHANES]    Frequency of Communication with Friends and Family: More than three times a week    Frequency of Social Gatherings with Friends and Family: Once a week    Attends Religious Services: Never    Database administrator or Organizations: No    Attends Banker Meetings: Never    Marital Status: Married  Catering manager Violence: Not At Risk (06/02/2023)   Humiliation, Afraid, Rape, and Kick questionnaire    Fear of Current or Ex-Partner: No    Emotionally Abused: No    Physically Abused:  No    Sexually Abused: No    Review of Systems:    Constitutional: No weight loss, fever, chills, weakness or fatigue HEENT: Eyes: No change in vision               Ears, Nose, Throat:  No change in hearing or congestion Skin: No rash or itching Cardiovascular: No chest pain, chest pressure or palpitations   Respiratory: No SOB or cough Gastrointestinal: See HPI and otherwise negative Genitourinary: No dysuria or change in urinary frequency Neurological: No headache, dizziness or syncope Musculoskeletal: No new muscle or joint pain Hematologic: No bleeding or bruising Psychiatric: No history of depression or anxiety    Physical Exam:  Vital signs: There were no vitals taken for this visit.  Constitutional: NAD, Well developed, Well nourished, alert and cooperative Head:  Normocephalic and atraumatic. Eyes:   PEERL, EOMI. No icterus. Conjunctiva pink. Respiratory: Respirations even and unlabored. Lungs clear to auscultation bilaterally.   No wheezes, crackles, or rhonchi.  Cardiovascular:  Regular rate and rhythm. No peripheral edema, cyanosis or pallor.  Gastrointestinal:  Soft, nondistended, nontender. No rebound or guarding. Normal bowel sounds. No appreciable masses or hepatomegaly. Rectal:  Not performed.  Msk:  Symmetrical without gross deformities. Without edema, no deformity or joint abnormality.  Neurologic:   Alert and  oriented x4;  grossly normal neurologically.  Skin:   Dry and intact without significant lesions or rashes. Psychiatric: Oriented to person, place and time. Demonstrates good judgement and reason without abnormal affect or behaviors.   RELEVANT LABS AND IMAGING: CBC    Component Value Date/Time   WBC 8.8 06/24/2023 1349   RBC 3.52 (L) 06/24/2023 1349   HGB 9.6 (L) 06/24/2023 1349   HGB 14.1 10/28/2019 1629   HCT 30.2 (L) 06/24/2023 1349   HCT 42.8 10/28/2019 1629   PLT 256.0 06/24/2023 1349   PLT 225 10/28/2019 1629   MCV 85.8 06/24/2023 1349   MCV 92 10/28/2019 1629   MCH 27.9 06/11/2023 0350   MCHC 31.7 06/24/2023 1349   RDW 17.7 (H) 06/24/2023 1349   RDW 12.5 10/28/2019 1629   LYMPHSABS 2.6 06/24/2023 1349   MONOABS 0.8 06/24/2023 1349   EOSABS 0.5 06/24/2023 1349   BASOSABS 0.2 (H) 06/24/2023 1349    CMP     Component Value Date/Time   NA 137 06/24/2023 1349   NA 143 04/14/2021 1125   K 3.9 06/24/2023 1349   CL 102 06/24/2023 1349   CO2 28 06/24/2023 1349   GLUCOSE 128 (H) 06/24/2023 1349   BUN 14 06/24/2023 1349   BUN 21 04/14/2021 1125   CREATININE 1.23 06/24/2023 1349   CALCIUM 9.0 06/24/2023 1349   PROT 7.9 06/24/2023 1349   PROT 7.0 10/28/2019 1629   ALBUMIN 3.8 06/24/2023 1349   ALBUMIN 4.2 10/28/2019 1629   AST 19 06/24/2023 1349   ALT 9 06/24/2023 1349   ALKPHOS 61 06/24/2023 1349   BILITOT 0.3 06/24/2023 1349   BILITOT 0.4 10/28/2019 1629   GFRNONAA >60 06/10/2023 0358   GFRAA 67 11/13/2019 0000     Assessment/Plan:       Lara Mulch [facility] 06/25/2023, 2:31 PM  Cc: [name], MD

## 2023-06-25 NOTE — Telephone Encounter (Signed)
Patient was seen for an OV with Dr. Jon Billings yesterday concerning this, catheter bag was removed during this visit.

## 2023-06-26 DIAGNOSIS — A4152 Sepsis due to Pseudomonas: Secondary | ICD-10-CM | POA: Diagnosis not present

## 2023-06-26 DIAGNOSIS — G319 Degenerative disease of nervous system, unspecified: Secondary | ICD-10-CM | POA: Diagnosis not present

## 2023-06-26 DIAGNOSIS — F0283 Dementia in other diseases classified elsewhere, unspecified severity, with mood disturbance: Secondary | ICD-10-CM | POA: Diagnosis not present

## 2023-06-26 DIAGNOSIS — I5022 Chronic systolic (congestive) heart failure: Secondary | ICD-10-CM | POA: Diagnosis not present

## 2023-06-26 DIAGNOSIS — N183 Chronic kidney disease, stage 3 unspecified: Secondary | ICD-10-CM | POA: Diagnosis not present

## 2023-06-26 DIAGNOSIS — N136 Pyonephrosis: Secondary | ICD-10-CM | POA: Diagnosis not present

## 2023-06-26 DIAGNOSIS — R6521 Severe sepsis with septic shock: Secondary | ICD-10-CM | POA: Diagnosis not present

## 2023-06-26 DIAGNOSIS — F319 Bipolar disorder, unspecified: Secondary | ICD-10-CM | POA: Diagnosis not present

## 2023-06-26 DIAGNOSIS — I130 Hypertensive heart and chronic kidney disease with heart failure and stage 1 through stage 4 chronic kidney disease, or unspecified chronic kidney disease: Secondary | ICD-10-CM | POA: Diagnosis not present

## 2023-06-26 NOTE — Telephone Encounter (Signed)
Copied from CRM [id number]. Topic: Clinical - Home Health Verbal Orders >> Jun 26, 2023 11:02 AM Maxwell Marion wrote: Onalee Hua, physical therapist with [facility], wants to let Dr. Jon Billings know that he is discharging the patient. He spoke with him this morning and the patient said he is fine now and no longer needing home health.   Verbally informed Dr. Jon Billings of patient's discharge from PT.

## 2023-07-01 ENCOUNTER — Emergency Department (HOSPITAL_COMMUNITY): Payer: Medicare HMO

## 2023-07-01 ENCOUNTER — Inpatient Hospital Stay (HOSPITAL_COMMUNITY)
Admission: EM | Admit: 2023-07-01 | Discharge: 2023-07-05 | DRG: 871 | Disposition: A | Discharge status: Home Health | Payer: Medicare HMO | Attending: Student | Admitting: Student

## 2023-07-01 ENCOUNTER — Encounter (HOSPITAL_COMMUNITY): Payer: Self-pay | Admitting: Emergency Medicine

## 2023-07-01 ENCOUNTER — Other Ambulatory Visit: Payer: Self-pay

## 2023-07-01 DIAGNOSIS — F319 Bipolar disorder, unspecified: Secondary | ICD-10-CM | POA: Diagnosis present

## 2023-07-01 DIAGNOSIS — Z7189 Other specified counseling: Secondary | ICD-10-CM | POA: Diagnosis not present

## 2023-07-01 DIAGNOSIS — F1021 Alcohol dependence, in remission: Secondary | ICD-10-CM | POA: Diagnosis present

## 2023-07-01 DIAGNOSIS — R6520 Severe sepsis without septic shock: Principal | ICD-10-CM | POA: Insufficient documentation

## 2023-07-01 DIAGNOSIS — E1140 Type 2 diabetes mellitus with diabetic neuropathy, unspecified: Secondary | ICD-10-CM | POA: Diagnosis present

## 2023-07-01 DIAGNOSIS — R000 Tachycardia, unspecified: Secondary | ICD-10-CM | POA: Diagnosis not present

## 2023-07-01 DIAGNOSIS — N183 Chronic kidney disease, stage 3 unspecified: Secondary | ICD-10-CM | POA: Diagnosis present

## 2023-07-01 DIAGNOSIS — R1312 Dysphagia, oropharyngeal phase: Secondary | ICD-10-CM | POA: Diagnosis not present

## 2023-07-01 DIAGNOSIS — N400 Enlarged prostate without lower urinary tract symptoms: Secondary | ICD-10-CM | POA: Diagnosis present

## 2023-07-01 DIAGNOSIS — I420 Dilated cardiomyopathy: Secondary | ICD-10-CM | POA: Diagnosis present

## 2023-07-01 DIAGNOSIS — N390 Urinary tract infection, site not specified: Secondary | ICD-10-CM

## 2023-07-01 DIAGNOSIS — N321 Vesicointestinal fistula: Secondary | ICD-10-CM | POA: Diagnosis not present

## 2023-07-01 DIAGNOSIS — I5042 Chronic combined systolic (congestive) and diastolic (congestive) heart failure: Secondary | ICD-10-CM | POA: Diagnosis present

## 2023-07-01 DIAGNOSIS — M130 Polyarthritis, unspecified: Secondary | ICD-10-CM | POA: Diagnosis present

## 2023-07-01 DIAGNOSIS — F03918 Unspecified dementia, unspecified severity, with other behavioral disturbance: Secondary | ICD-10-CM | POA: Diagnosis present

## 2023-07-01 DIAGNOSIS — F0393 Unspecified dementia, unspecified severity, with mood disturbance: Secondary | ICD-10-CM | POA: Diagnosis not present

## 2023-07-01 DIAGNOSIS — S0990XA Unspecified injury of head, initial encounter: Secondary | ICD-10-CM | POA: Diagnosis not present

## 2023-07-01 DIAGNOSIS — F3131 Bipolar disorder, current episode depressed, mild: Secondary | ICD-10-CM | POA: Diagnosis not present

## 2023-07-01 DIAGNOSIS — F5101 Primary insomnia: Secondary | ICD-10-CM | POA: Diagnosis present

## 2023-07-01 DIAGNOSIS — N179 Acute kidney failure, unspecified: Secondary | ICD-10-CM | POA: Diagnosis not present

## 2023-07-01 DIAGNOSIS — N138 Other obstructive and reflux uropathy: Secondary | ICD-10-CM | POA: Diagnosis present

## 2023-07-01 DIAGNOSIS — J9601 Acute respiratory failure with hypoxia: Secondary | ICD-10-CM | POA: Diagnosis present

## 2023-07-01 DIAGNOSIS — D631 Anemia in chronic kidney disease: Secondary | ICD-10-CM | POA: Diagnosis not present

## 2023-07-01 DIAGNOSIS — E669 Obesity, unspecified: Secondary | ICD-10-CM | POA: Diagnosis present

## 2023-07-01 DIAGNOSIS — E1122 Type 2 diabetes mellitus with diabetic chronic kidney disease: Secondary | ICD-10-CM | POA: Diagnosis present

## 2023-07-01 DIAGNOSIS — N401 Enlarged prostate with lower urinary tract symptoms: Secondary | ICD-10-CM | POA: Diagnosis not present

## 2023-07-01 DIAGNOSIS — E871 Hypo-osmolality and hyponatremia: Secondary | ICD-10-CM | POA: Diagnosis present

## 2023-07-01 DIAGNOSIS — Z1611 Resistance to penicillins: Secondary | ICD-10-CM | POA: Diagnosis present

## 2023-07-01 DIAGNOSIS — B965 Pseudomonas (aeruginosa) (mallei) (pseudomallei) as the cause of diseases classified elsewhere: Secondary | ICD-10-CM | POA: Diagnosis present

## 2023-07-01 DIAGNOSIS — N189 Chronic kidney disease, unspecified: Secondary | ICD-10-CM | POA: Diagnosis present

## 2023-07-01 DIAGNOSIS — Z8711 Personal history of peptic ulcer disease: Secondary | ICD-10-CM

## 2023-07-01 DIAGNOSIS — A419 Sepsis, unspecified organism: Secondary | ICD-10-CM | POA: Diagnosis not present

## 2023-07-01 DIAGNOSIS — G4733 Obstructive sleep apnea (adult) (pediatric): Secondary | ICD-10-CM | POA: Diagnosis not present

## 2023-07-01 DIAGNOSIS — G8929 Other chronic pain: Secondary | ICD-10-CM | POA: Diagnosis present

## 2023-07-01 DIAGNOSIS — Z79899 Other long term (current) drug therapy: Secondary | ICD-10-CM

## 2023-07-01 DIAGNOSIS — Z0389 Encounter for observation for other suspected diseases and conditions ruled out: Secondary | ICD-10-CM | POA: Diagnosis not present

## 2023-07-01 DIAGNOSIS — R4182 Altered mental status, unspecified: Principal | ICD-10-CM

## 2023-07-01 DIAGNOSIS — R251 Tremor, unspecified: Secondary | ICD-10-CM | POA: Diagnosis present

## 2023-07-01 DIAGNOSIS — R296 Repeated falls: Secondary | ICD-10-CM | POA: Diagnosis present

## 2023-07-01 DIAGNOSIS — Z515 Encounter for palliative care: Secondary | ICD-10-CM | POA: Diagnosis not present

## 2023-07-01 DIAGNOSIS — D696 Thrombocytopenia, unspecified: Secondary | ICD-10-CM | POA: Diagnosis present

## 2023-07-01 DIAGNOSIS — I130 Hypertensive heart and chronic kidney disease with heart failure and stage 1 through stage 4 chronic kidney disease, or unspecified chronic kidney disease: Secondary | ICD-10-CM | POA: Diagnosis present

## 2023-07-01 DIAGNOSIS — Z8249 Family history of ischemic heart disease and other diseases of the circulatory system: Secondary | ICD-10-CM

## 2023-07-01 DIAGNOSIS — I499 Cardiac arrhythmia, unspecified: Secondary | ICD-10-CM | POA: Diagnosis not present

## 2023-07-01 DIAGNOSIS — Z66 Do not resuscitate: Secondary | ICD-10-CM | POA: Diagnosis not present

## 2023-07-01 DIAGNOSIS — Z87440 Personal history of urinary (tract) infections: Secondary | ICD-10-CM

## 2023-07-01 DIAGNOSIS — Z6830 Body mass index (BMI) 30.0-30.9, adult: Secondary | ICD-10-CM

## 2023-07-01 DIAGNOSIS — Z1152 Encounter for screening for COVID-19: Secondary | ICD-10-CM | POA: Diagnosis not present

## 2023-07-01 DIAGNOSIS — G9341 Metabolic encephalopathy: Secondary | ICD-10-CM | POA: Diagnosis present

## 2023-07-01 DIAGNOSIS — M549 Dorsalgia, unspecified: Secondary | ICD-10-CM | POA: Diagnosis present

## 2023-07-01 DIAGNOSIS — I6782 Cerebral ischemia: Secondary | ICD-10-CM | POA: Diagnosis not present

## 2023-07-01 DIAGNOSIS — R32 Unspecified urinary incontinence: Secondary | ICD-10-CM | POA: Diagnosis present

## 2023-07-01 DIAGNOSIS — Z888 Allergy status to other drugs, medicaments and biological substances status: Secondary | ICD-10-CM

## 2023-07-01 DIAGNOSIS — Z85828 Personal history of other malignant neoplasm of skin: Secondary | ICD-10-CM

## 2023-07-01 DIAGNOSIS — R231 Pallor: Secondary | ICD-10-CM | POA: Diagnosis not present

## 2023-07-01 DIAGNOSIS — I77810 Thoracic aortic ectasia: Secondary | ICD-10-CM | POA: Diagnosis present

## 2023-07-01 DIAGNOSIS — N3081 Other cystitis with hematuria: Secondary | ICD-10-CM | POA: Diagnosis present

## 2023-07-01 DIAGNOSIS — N3001 Acute cystitis with hematuria: Secondary | ICD-10-CM | POA: Diagnosis not present

## 2023-07-01 DIAGNOSIS — E876 Hypokalemia: Secondary | ICD-10-CM | POA: Diagnosis not present

## 2023-07-01 DIAGNOSIS — F3130 Bipolar disorder, current episode depressed, mild or moderate severity, unspecified: Secondary | ICD-10-CM | POA: Diagnosis present

## 2023-07-01 DIAGNOSIS — E785 Hyperlipidemia, unspecified: Secondary | ICD-10-CM | POA: Diagnosis present

## 2023-07-01 DIAGNOSIS — R0689 Other abnormalities of breathing: Secondary | ICD-10-CM | POA: Diagnosis not present

## 2023-07-01 HISTORY — DX: Urinary tract infection, site not specified: N39.0

## 2023-07-01 LAB — CBC WITH DIFFERENTIAL/PLATELET
Abs Immature Granulocytes: 0.07 10*3/uL (ref 0.00–0.07)
Basophils Absolute: 0.1 10*3/uL (ref 0.0–0.1)
Basophils Relative: 1 %
Eosinophils Absolute: 0 10*3/uL (ref 0.0–0.5)
Eosinophils Relative: 0 %
HCT: 28.9 % — ABNORMAL LOW (ref 39.0–52.0)
Hemoglobin: 8.8 g/dL — ABNORMAL LOW (ref 13.0–17.0)
Immature Granulocytes: 1 %
Lymphocytes Relative: 11 %
Lymphs Abs: 1.3 10*3/uL (ref 0.7–4.0)
MCH: 27.3 pg (ref 26.0–34.0)
MCHC: 30.4 g/dL (ref 30.0–36.0)
MCV: 89.8 fL (ref 80.0–100.0)
Monocytes Absolute: 1.4 10*3/uL — ABNORMAL HIGH (ref 0.1–1.0)
Monocytes Relative: 12 %
Neutro Abs: 8.9 10*3/uL — ABNORMAL HIGH (ref 1.7–7.7)
Neutrophils Relative %: 75 %
Platelets: 181 10*3/uL (ref 150–400)
RBC: 3.22 MIL/uL — ABNORMAL LOW (ref 4.22–5.81)
RDW: 16.9 % — ABNORMAL HIGH (ref 11.5–15.5)
WBC: 11.8 10*3/uL — ABNORMAL HIGH (ref 4.0–10.5)
nRBC: 0 % (ref 0.0–0.2)

## 2023-07-01 LAB — URINALYSIS, W/ REFLEX TO CULTURE (INFECTION SUSPECTED)
Bilirubin Urine: NEGATIVE
Glucose, UA: NEGATIVE mg/dL
Ketones, ur: NEGATIVE mg/dL
Nitrite: POSITIVE — AB
Protein, ur: 100 mg/dL — AB
Specific Gravity, Urine: 1.009 (ref 1.005–1.030)
WBC, UA: >50 WBC/hpf (ref 0–5)
pH: 5 (ref 5.0–8.0)

## 2023-07-01 LAB — APTT: aPTT: 31 s (ref 24–36)

## 2023-07-01 LAB — COMPREHENSIVE METABOLIC PANEL
ALT: 9 U/L (ref 0–44)
AST: 17 U/L (ref 15–41)
Albumin: 3 g/dL — ABNORMAL LOW (ref 3.5–5.0)
Alkaline Phosphatase: 49 U/L (ref 38–126)
Anion gap: 11 (ref 5–15)
BUN: 16 mg/dL (ref 8–23)
CO2: 23 mmol/L (ref 22–32)
Calcium: 8.7 mg/dL — ABNORMAL LOW (ref 8.9–10.3)
Chloride: 99 mmol/L (ref 98–111)
Creatinine, Ser: 1.49 mg/dL — ABNORMAL HIGH (ref 0.61–1.24)
GFR, Estimated: 49 mL/min — ABNORMAL LOW (ref >60)
Glucose, Bld: 134 mg/dL — ABNORMAL HIGH (ref 70–99)
Potassium: 4.5 mmol/L (ref 3.5–5.1)
Sodium: 133 mmol/L — ABNORMAL LOW (ref 135–145)
Total Bilirubin: 0.7 mg/dL (ref 0.0–1.2)
Total Protein: 7.4 g/dL (ref 6.5–8.1)

## 2023-07-01 LAB — RESP PANEL BY RT-PCR (RSV, FLU A&B, COVID)  RVPGX2
Influenza A by PCR: NEGATIVE
Influenza B by PCR: NEGATIVE
Resp Syncytial Virus by PCR: NEGATIVE
SARS Coronavirus 2 by RT PCR: NEGATIVE

## 2023-07-01 LAB — I-STAT CG4 LACTIC ACID, ED
Lactic Acid, Venous: 1.1 mmol/L (ref 0.5–1.9)
Lactic Acid, Venous: 0.8 mmol/L (ref 0.5–1.9)

## 2023-07-01 LAB — PROTIME-INR
INR: 1.2 (ref 0.8–1.2)
Prothrombin Time: 15 s (ref 11.4–15.2)

## 2023-07-01 MED ORDER — VENLAFAXINE HCL ER 150 MG PO CP24
300.0000 mg | ORAL_CAPSULE | Freq: Every day | ORAL | Status: DC
  Administered 2023-07-02 – 2023-07-05 (×4): 300 mg via ORAL
  Filled 2023-07-01: qty 2
  Filled 2023-07-01: qty 4
  Filled 2023-07-01 (×2): qty 2

## 2023-07-01 MED ORDER — LACTATED RINGERS IV BOLUS (SEPSIS)
1000.0000 mL | Freq: Once | INTRAVENOUS | Status: AC
  Administered 2023-07-01: 1000 mL via INTRAVENOUS

## 2023-07-01 MED ORDER — SODIUM CHLORIDE 0.9% FLUSH
3.0000 mL | Freq: Two times a day (BID) | INTRAVENOUS | Status: DC
  Administered 2023-07-01 – 2023-07-05 (×8): 3 mL via INTRAVENOUS

## 2023-07-01 MED ORDER — GABAPENTIN 300 MG PO CAPS
300.0000 mg | ORAL_CAPSULE | Freq: Every day | ORAL | Status: DC
  Administered 2023-07-02 – 2023-07-04 (×4): 300 mg via ORAL
  Filled 2023-07-01 (×4): qty 1

## 2023-07-01 MED ORDER — VANCOMYCIN HCL IN DEXTROSE 1-5 GM/200ML-% IV SOLN
1000.0000 mg | Freq: Once | INTRAVENOUS | Status: AC
  Administered 2023-07-01: 1000 mg via INTRAVENOUS
  Filled 2023-07-01: qty 200

## 2023-07-01 MED ORDER — ACETAMINOPHEN 500 MG PO TABS
1000.0000 mg | ORAL_TABLET | Freq: Once | ORAL | Status: AC
  Administered 2023-07-01: 1000 mg via ORAL
  Filled 2023-07-01: qty 2

## 2023-07-01 MED ORDER — POLYETHYLENE GLYCOL 3350 17 G PO PACK: 17.0000 g | PACK | Freq: Every day | ORAL | Status: DC | PRN

## 2023-07-01 MED ORDER — PANTOPRAZOLE SODIUM 40 MG IV SOLR
40.0000 mg | INTRAVENOUS | Status: DC
  Administered 2023-07-02 – 2023-07-03 (×3): 40 mg via INTRAVENOUS
  Filled 2023-07-01 (×3): qty 10

## 2023-07-01 MED ORDER — SODIUM CHLORIDE 0.9 % IV SOLN
2.0000 g | Freq: Once | INTRAVENOUS | Status: AC
  Administered 2023-07-01: 2 g via INTRAVENOUS
  Filled 2023-07-01: qty 12.5

## 2023-07-01 MED ORDER — PRIMIDONE 50 MG PO TABS
50.0000 mg | ORAL_TABLET | Freq: Two times a day (BID) | ORAL | Status: DC
  Administered 2023-07-02 – 2023-07-05 (×8): 50 mg via ORAL
  Filled 2023-07-01 (×9): qty 1

## 2023-07-01 MED ORDER — ACETAMINOPHEN 650 MG RE SUPP
650.0000 mg | Freq: Four times a day (QID) | RECTAL | Status: DC | PRN
Start: 2023-07-01 — End: 2023-07-05

## 2023-07-01 MED ORDER — BUSPIRONE HCL 10 MG PO TABS
10.0000 mg | ORAL_TABLET | Freq: Three times a day (TID) | ORAL | Status: DC
  Administered 2023-07-02 – 2023-07-05 (×11): 10 mg via ORAL
  Filled 2023-07-01 (×11): qty 1

## 2023-07-01 MED ORDER — DIVALPROEX SODIUM 125 MG PO CSDR
500.0000 mg | DELAYED_RELEASE_CAPSULE | Freq: Every day | ORAL | Status: DC
  Administered 2023-07-02 – 2023-07-04 (×4): 500 mg via ORAL
  Filled 2023-07-01 (×4): qty 4

## 2023-07-01 MED ORDER — METRONIDAZOLE 500 MG/100ML IV SOLN
500.0000 mg | Freq: Once | INTRAVENOUS | Status: AC
  Administered 2023-07-01: 500 mg via INTRAVENOUS
  Filled 2023-07-01: qty 100

## 2023-07-01 MED ORDER — TAMSULOSIN HCL 0.4 MG PO CAPS
0.4000 mg | ORAL_CAPSULE | Freq: Every day | ORAL | Status: DC
  Administered 2023-07-02 – 2023-07-04 (×4): 0.4 mg via ORAL
  Filled 2023-07-01 (×5): qty 1

## 2023-07-01 MED ORDER — MIDODRINE HCL 5 MG PO TABS
5.0000 mg | ORAL_TABLET | Freq: Three times a day (TID) | ORAL | Status: DC
  Administered 2023-07-02 – 2023-07-05 (×10): 5 mg via ORAL
  Filled 2023-07-01 (×11): qty 1

## 2023-07-01 MED ORDER — PRAVASTATIN SODIUM 40 MG PO TABS
20.0000 mg | ORAL_TABLET | Freq: Every day | ORAL | Status: DC
  Administered 2023-07-02 – 2023-07-04 (×3): 20 mg via ORAL
  Filled 2023-07-01: qty 1
  Filled 2023-07-01: qty 2
  Filled 2023-07-01 (×2): qty 1

## 2023-07-01 MED ORDER — ARIPIPRAZOLE 2 MG PO TABS
2.0000 mg | ORAL_TABLET | Freq: Every day | ORAL | Status: DC
  Administered 2023-07-02 – 2023-07-05 (×4): 2 mg via ORAL
  Filled 2023-07-01 (×4): qty 1

## 2023-07-01 MED ORDER — ACETAMINOPHEN 325 MG PO TABS
650.0000 mg | ORAL_TABLET | Freq: Four times a day (QID) | ORAL | Status: DC | PRN
  Administered 2023-07-02 – 2023-07-04 (×2): 650 mg via ORAL
  Filled 2023-07-01 (×2): qty 2

## 2023-07-01 MED ORDER — PIPERACILLIN-TAZOBACTAM 3.375 G IVPB
3.3750 g | Freq: Three times a day (TID) | INTRAVENOUS | Status: DC
  Administered 2023-07-02 – 2023-07-04 (×8): 3.375 g via INTRAVENOUS
  Filled 2023-07-01 (×8): qty 50

## 2023-07-01 MED ORDER — LACTATED RINGERS IV SOLN: INTRAVENOUS | Status: DC

## 2023-07-01 MED ORDER — SODIUM CHLORIDE 0.9 % IV SOLN
1.0000 g | Freq: Every day | INTRAVENOUS | Status: DC
  Administered 2023-07-01: 1 g via INTRAVENOUS
  Filled 2023-07-01: qty 10

## 2023-07-01 MED ORDER — TRAZODONE HCL 50 MG PO TABS
300.0000 mg | ORAL_TABLET | Freq: Every day | ORAL | Status: DC
  Administered 2023-07-02 – 2023-07-04 (×3): 300 mg via ORAL
  Filled 2023-07-01 (×3): qty 6

## 2023-07-01 MED ORDER — ENOXAPARIN SODIUM 40 MG/0.4ML IJ SOSY
40.0000 mg | PREFILLED_SYRINGE | INTRAMUSCULAR | Status: DC
  Administered 2023-07-02 – 2023-07-04 (×3): 40 mg via SUBCUTANEOUS
  Filled 2023-07-01 (×3): qty 0.4

## 2023-07-01 NOTE — Progress Notes (Signed)
Pharmacy Antibiotic Note  Calvin Escobar is a 73 y.o. male admitted on 07/01/2023 with sepsis.  Pharmacy has been consulted for zosyn dosing.  Plan: Zosyn 3.375g q8h  F/u renal function, infectious work up and length of therapy  Height: 6\' 1"  (185.4 cm) Weight: 103.1 kg (227 lb 3.2 oz) IBW/kg (Calculated) : 79.9  Temp (24hrs), Avg:100 F (37.8 C), Min:100 F (37.8 C), Max:100 F (37.8 C)  Recent Labs  Lab 07/01/23 1953 07/01/23 2000  WBC 11.8*  --   CREATININE 1.49*  --   LATICACIDVEN  --  1.1    Estimated Creatinine Clearance: 54.9 mL/min (A) (by C-G formula based on SCr of 1.49 mg/dL (H)).    Allergies  Allergen Reactions   Albuterol Palpitations    Supraventricular Tachycardia    Antimicrobials this admission: Cefepime 1/27  Ceftriaxone 1/27 > Flagyl 1/27  Vancomycin 1/27 Zosyn 1/27 >  Microbiology results: 1/27 Ucx: IP 1/27 resp pcr: neg  1/27 Bcx: IP   Thank you for allowing pharmacy to be a part of this patient's care.  Marja Kays 07/01/2023 10:55 PM

## 2023-07-01 NOTE — Assessment & Plan Note (Signed)
Chronic.

## 2023-07-01 NOTE — Sepsis Progress Note (Signed)
Following for sepsis monitoring ?

## 2023-07-01 NOTE — H&P (Signed)
**Note De-Identified via Calvin Escobar** History and Physical    Patient: Calvin Escobar WUJ:[id number] DOB: 10/27/1950 DOA: 07/01/2023 DOS: the patient was seen and examined on 07/01/2023 PCP: [name], MD  Patient coming from: Home  Chief Complaint:  Chief Complaint  Patient presents with   Code Sepsis   HPI: history principally from record review and signout. I tried calling wife on [phone]. Left voicemail.    Calvin Escobar is a 73 y.o. male with medical history significant of Bipolar affective disorder, depressed (HCC); Chronic bronchitis (HCC); Dementia (HCC); Hyperlipidemia; DCM (dilated cardiomyopathy) (HCC); OSA treated with BiPAP; HFrEF (heart failure with reduced ejection fraction) (HCC); Ascending aorta dilatation (HCC); Alcoholism in remission (HCC); Benign prostatic hyperplasia with urinary obstruction; PAC (premature atrial contraction); Primary insomnia; PUD (peptic ulcer disease); Generalized arthritis; Morbid obesity (HCC); Urinary incontinence; Hypertension; Limp; Gynecomastia, male; Chronic back pain; Stage 3 chronic kidney disease (HCC); Normocytic anemia; Tremor; Lack of appetite; Anemia of chronic disease; Syncope; Acute cystitis without hematuria; Diarrhea; AKI (acute kidney injury) (HCC); Hyponatremia; Obesity, Class II, BMI 35-39.9; E coli bacteremia; Septic shock from UTI; Acute respiratory failure with hypoxia (HCC); Thrombocytopenia (HCC); LBBB (left bundle branch block); Colovesical fistula; Urinary tract infection without hematuria; and Sepsis secondary to UTI Calvin Escobar) on their problem list.   Per record review patient was having sharp penile pain as far back as June 24, 2023, 5 days ago.  For which she was seen at PCP.  At that time it was felt to be due to indwelling catheter.  And given patient's desire, the indwelling catheter was removed.  Patient was okay earlier this morning till around 6 AM, when he was noted by family to be drinking coffee.  However subsequently patient was  noted to have slurred speech at home, EMS was called who noted a temperature of 102.6 F at home.  Patient is brought to ER.  Was given Tylenol and route.  On arrival to ER patient is tachycardic, temperature down to 100 F heart rate 120, breathing at 30.  White count 11.    Patient is s/p vancomycin cefepime and metronidazole. Patient was unable to void in the ER insptie of 500 cc urine assessed on bladder scan. S.p. foley placement. Urine felt to be infected.   Medical evaluation is sought.  Patient at this time offers no complaints.  Patient is able to recall meaningful HPI.  See exam  No report of diarrhea, cough, chest pain, rash on skin. Trauma.   Review of Systems: unable to review all systems due to the inability of the patient to answer questions. Past Medical History:  Diagnosis Date   Allergy seasonal   Ascending aorta dilatation (HCC)    38 mm by 2D echo 04/2021   Cancer (HCC) skin   DCM (dilated cardiomyopathy) (HCC)    nonischemic with normal coronary arteries at cath 06/2019.  EF 35 to 40% on echo 04/2021   Dementia (HCC)    Depression    Hyperlipidemia    Lithium toxicity 01/29/2016   OSA treated with BiPAP    SKIN CANCER, HX OF 05/10/2007   Facial left check and forehead and r shoulder  F/w derm   Urinary dribbling 03/14/2022   Past Surgical History:  Procedure Laterality Date   COLONOSCOPY     RIGHT/LEFT HEART CATH AND CORONARY ANGIOGRAPHY N/A 07/03/2019   Procedure: RIGHT/LEFT HEART CATH AND CORONARY ANGIOGRAPHY;  Surgeon: Dolores Patty, MD;  Location: [facility];  Service: Cardiovascular;  Laterality: N/A;  Social History:  reports that he has never smoked. He has never used smokeless tobacco. He reports current alcohol use of about 14.0 standard drinks of alcohol per week. He reports that he does not use drugs.  Allergies  Allergen Reactions   Albuterol Palpitations    Supraventricular Tachycardia     Family History  Problem Relation Age  of Onset   Hypertension Mother    Cancer Father    Bone cancer Sister     Prior to Admission medications   Medication Sig Start Date End Date Taking? Authorizing Provider  ARIPiprazole (ABILIFY) 2 MG tablet Take 2 mg by mouth daily. 03/21/23   [provider]  ascorbic acid (VITAMIN C) 500 MG tablet Take 500 mg by mouth daily.    [provider]  busPIRone (BUSPAR) 10 MG tablet Take 10 mg by mouth 3 (three) times daily. 12/27/22   [provider]  calcium-vitamin D (OSCAL WITH D) 500-5 MG-MCG tablet Take 1 tablet by mouth daily with breakfast.    [provider]  divalproex (DEPAKOTE ER) 250 MG 24 hr tablet Take 500 mg by mouth at bedtime.    [provider]  famotidine (PEPCID) 20 MG tablet Take 1 tablet (20 mg total) by mouth 2 (two) times daily. 06/11/23   Osvaldo Shipper, MD  folic acid (FOLVITE) 1 MG tablet Take 1 tablet (1 mg total) by mouth daily. 03/07/23 03/06/24  [name], MD  gabapentin (NEURONTIN) 300 MG capsule Take 300 mg by mouth at bedtime. 03/21/23   [provider]  midodrine (PROAMATINE) 5 MG tablet Take 1 tablet (5 mg total) by mouth 3 (three) times daily with meals. 06/11/23   Osvaldo Shipper, MD  mirtazapine (REMERON) 30 MG tablet Take 30 mg by mouth at bedtime. 03/21/23   [provider]  Multiple Vitamin (MULTIVITAMIN) tablet Take 1 tablet by mouth daily.    [provider]  pravastatin (PRAVACHOL) 20 MG tablet Take 1 tablet (20 mg total) by mouth at bedtime. 12/19/22   [name], MD  primidone (MYSOLINE) 50 MG tablet Take 1 tablet (50 mg total) by mouth 2 (two) times daily. 06/11/23   Osvaldo Shipper, MD  tamsulosin (FLOMAX) 0.4 MG CAPS capsule TAKE 1 CAPSULE BY MOUTH EVERY DAY 11/12/22   [name], MD  traZODone (DESYREL) 100 MG tablet Take 300 mg by mouth at bedtime. 02/06/20   [provider]  venlafaxine XR (EFFEXOR-XR) 150 MG 24 hr capsule Take 300 mg by mouth daily with  breakfast.    [provider]    Physical Exam: Vitals:   07/01/23 2045 07/01/23 2100 07/01/23 2115 07/01/23 2116  BP: (!) 158/82 (!) 161/94 (!) 147/81   Pulse: (!) 111 (!) 118 (!) 116 (!) 116  Resp: 20 (!) 30 (!) 21 (!) 32  Temp:      TempSrc:      SpO2: 92% 99% 100% 95%  Weight:      Height:       General: Patient is awake, interactive, calm.  However patient is not able to recall events of the day, does not answer where he is or why he is at the hospital.  I tried to reorient the patient.  Patient is able to give me name and date of birth. Respiratory exam: Bilateral intravesicular Cardiovascular exam S1-S2 normal Abdomen all quadrant soft nontender No nuchal rigidity Extremities warm without edema Neurologically patient is alert and awake, mental status as above No focal motor  deficit is noted, patient is felt to be chronically deconditioned. Data Reviewed:  Labs on Admission:  Results for orders placed or performed during the hospital encounter of 07/01/23 (from the past 24 hours)  Resp panel by RT-PCR (RSV, Flu A&B, Covid) Anterior Nasal Swab     Status: None   Collection Time: 07/01/23  7:47 PM   Specimen: Anterior Nasal Swab  Result Value Ref Range   SARS Coronavirus 2 by RT PCR NEGATIVE NEGATIVE   Influenza A by PCR NEGATIVE NEGATIVE   Influenza B by PCR NEGATIVE NEGATIVE   Resp Syncytial Virus by PCR NEGATIVE NEGATIVE  Comprehensive metabolic panel     Status: Abnormal   Collection Time: 07/01/23  7:53 PM  Result Value Ref Range   Sodium 133 (L) 135 - 145 mmol/L   Potassium 4.5 3.5 - 5.1 mmol/L   Chloride 99 98 - 111 mmol/L   CO2 23 22 - 32 mmol/L   Glucose, Bld 134 (H) 70 - 99 mg/dL   BUN 16 8 - 23 mg/dL   Creatinine, Ser 9.60 (H) 0.61 - 1.24 mg/dL   Calcium 8.7 (L) 8.9 - 10.3 mg/dL   Total Protein 7.4 6.5 - 8.1 g/dL   Albumin 3.0 (L) 3.5 - 5.0 g/dL   AST 17 15 - 41 U/L   ALT 9 0 - 44 U/L   Alkaline Phosphatase 49 38 - 126 U/L   Total Bilirubin  0.7 0.0 - 1.2 mg/dL   GFR, Estimated 49 (L) >60 mL/min   Anion gap 11 5 - 15  CBC with Differential     Status: Abnormal   Collection Time: 07/01/23  7:53 PM  Result Value Ref Range   WBC 11.8 (H) 4.0 - 10.5 K/uL   RBC 3.22 (L) 4.22 - 5.81 MIL/uL   Hemoglobin 8.8 (L) 13.0 - 17.0 g/dL   HCT 45.4 (L) 09.8 - 11.9 %   MCV 89.8 80.0 - 100.0 fL   MCH 27.3 26.0 - 34.0 pg   MCHC 30.4 30.0 - 36.0 g/dL   RDW 14.7 (H) 82.9 - 56.2 %   Platelets 181 150 - 400 K/uL   nRBC 0.0 0.0 - 0.2 %   Neutrophils Relative % 75 %   Neutro Abs 8.9 (H) 1.7 - 7.7 K/uL   Lymphocytes Relative 11 %   Lymphs Abs 1.3 0.7 - 4.0 K/uL   Monocytes Relative 12 %   Monocytes Absolute 1.4 (H) 0.1 - 1.0 K/uL   Eosinophils Relative 0 %   Eosinophils Absolute 0.0 0.0 - 0.5 K/uL   Basophils Relative 1 %   Basophils Absolute 0.1 0.0 - 0.1 K/uL   Immature Granulocytes 1 %   Abs Immature Granulocytes 0.07 0.00 - 0.07 K/uL  Protime-INR     Status: None   Collection Time: 07/01/23  7:53 PM  Result Value Ref Range   Prothrombin Time 15.0 11.4 - 15.2 seconds   INR 1.2 0.8 - 1.2  APTT     Status: None   Collection Time: 07/01/23  7:53 PM  Result Value Ref Range   aPTT 31 24 - 36 seconds  I-Stat Lactic Acid, ED     Status: None   Collection Time: 07/01/23  8:00 PM  Result Value Ref Range   Lactic Acid, Venous 1.1 0.5 - 1.9 mmol/L  Urinalysis, w/ Reflex to Culture (Infection Suspected) -Urine, Clean Catch     Status: Abnormal   Collection Time: 07/01/23  8:57 PM  Result Value Ref Range  Specimen Source URINE, CLEAN CATCH    Color, Urine YELLOW YELLOW   APPearance CLOUDY (A) CLEAR   Specific Gravity, Urine 1.009 1.005 - 1.030   pH 5.0 5.0 - 8.0   Glucose, UA NEGATIVE NEGATIVE mg/dL   Hgb urine dipstick SMALL (A) NEGATIVE   Bilirubin Urine NEGATIVE NEGATIVE   Ketones, ur NEGATIVE NEGATIVE mg/dL   Protein, ur 161 (A) NEGATIVE mg/dL   Nitrite POSITIVE (A) NEGATIVE   Leukocytes,Ua MODERATE (A) NEGATIVE   RBC / HPF 6-10  0 - 5 RBC/hpf   WBC, UA >50 0 - 5 WBC/hpf   Bacteria, UA MANY (A) NONE SEEN   Squamous Epithelial / HPF 0-5 0 - 5 /HPF   WBC Clumps PRESENT    Mucus PRESENT   Urine Culture     Status: None (Preliminary result)   Collection Time: 07/01/23  8:57 PM   Specimen: Urine, Clean Catch  Result Value Ref Range   Specimen Description URINE, CLEAN CATCH    Special Requests      NONE Reflexed from [id number] Performed at [facility], [address], [place], [place] [address]    Culture PENDING    Report Status PENDING    Basic Metabolic Panel: Recent Labs  Lab 07/01/23 1953  NA 133*  K 4.5  CL 99  CO2 23  GLUCOSE 134*  BUN 16  CREATININE 1.49*  CALCIUM 8.7*   Liver Function Tests: Recent Labs  Lab 07/01/23 1953  AST 17  ALT 9  ALKPHOS 49  BILITOT 0.7  PROT 7.4  ALBUMIN 3.0*   No results for input(s): "LIPASE", "AMYLASE" in the last 168 hours. No results for input(s): "AMMONIA" in the last 168 hours. CBC: Recent Labs  Lab 07/01/23 1953  WBC 11.8*  NEUTROABS 8.9*  HGB 8.8*  HCT 28.9*  MCV 89.8  PLT 181   Cardiac Enzymes: No results for input(s): "CKTOTAL", "CKMB", "CKMBINDEX", "TROPONINIHS" in the last 168 hours.  BNP (last 3 results) No results for input(s): "PROBNP" in the last 8760 hours. CBG: No results for input(s): "GLUCAP" in the last 168 hours.  Radiological Exams on Admission:  DG Chest Port 1 View Result Date: 07/01/2023 CLINICAL DATA:  Possible sepsis EXAM: PORTABLE CHEST 1 VIEW COMPARISON:  06/07/2023 FINDINGS: Cardiac shadow is stable. Poor inspiratory effort is noted. Calcified granuloma is again noted in the right mid lung. No focal infiltrate or effusion is seen. No bony abnormality is noted. IMPRESSION: Poor inspiratory effort.  No acute abnormality noted. Electronically Signed   By: Alcide Clever M.D.   On: 07/01/2023 20:14    EKG: sinus tachycardia.     Assessment and Plan: * Sepsis [facility]) Of urologic origin. CXR wnl. Resp viral panel  X 4 -ve, no h/o direha available. No nuchal rigidity.  Blood and urine culture are pending.  Patient started on vancomycin plus cefepime plus Flagyl.  Got 3 L of LR +150 mL/h standing x 20 h.  I will continue treatment with ceftriaxone  AKI (acute kidney injury) (HCC) Patient has chronic BPH as well as documented urinary obstruction.  Patient's Foley catheter was removed approximately 5 days ago because he was having discomfort from the catheter.  Here on ER evaluation, patient was noted to have at least 500 cc of urine in the bladder that could not be voided by the patient.  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  Foley catheter has been replaced.  We will monitor ins and outs.  Patient  has received 3 L of lactated Ringer's.  We will trend the creatinine.  Dementia (HCC) Chronic.   Chronic medical issues:  Colovesical fistula. CT abdomen pelvis 06/01/2024 with and without contrast shows evidence of large amount of gas and debris's in the urinary bladder along with moderate right-sided hydronephrosis.  As well as concern for colovesical fistula. General surgery was consulted as well as urology in last admission in Dec. No plans for surgical intervention noted.  Conservative management  If these conservative measures fail percutaneous nephrostomy to be considered according to the notes. Recently had a cystoscopy which did not show any evidence of colovesical fistula. Prior to any colectomy patient will need to undergo colonoscopy and cardiac clearance.       Recurrent fall. Fall precaution   Chronic HFrEF. Echocardiogram from December 2024 showed EF to be 50 to 55%.  Echo from October 2024 showed EF to be 35 to 40%. Does not appear to be volume overloaded currently.   Considering that he is still requiring midodrine (per PCP 06/24/2023) hold other therapies.  Check cortisol in AM  Normocytic anemia Stable for the most part.   OSA. On CPAP.  Will  continue.   Chronic tremors. On primidone.   History of dementia. Currently stable.   Bipolar disorder. On home regimen including Depakote, BuSpar, trazodone, Remeron and Abilify.   HLD. On Pravachol.  Continue.    Dysphagia Speech therapy has been following.  Currently on dysphagia 3 diet.  Troponin pending.  Please review home med rec after pharmcy input. Some medications ordred based on last DC summary.   Advance Care Planning:   Code Status: Prior full code.  Consults: none at this time.  Family Communication: too late. Attempted to call wife as above. Left voicmeail. Best to reconnect in the AM  Severity of Illness: The appropriate patient status for this patient is INPATIENT. Inpatient status is judged to be reasonable and necessary in order to provide the required intensity of service to ensure the patient's safety. The patient's presenting symptoms, physical exam findings, and initial radiographic and laboratory data in the context of their chronic comorbidities is felt to place them at high risk for further clinical deterioration. Furthermore, it is not anticipated that the patient will be medically stable for discharge from the hospital within 2 midnights of admission.   * I certify that at the point of admission it is my clinical judgment that the patient will require inpatient hospital care spanning beyond 2 midnights from the point of admission due to high intensity of service, high risk for further deterioration and high frequency of surveillance required.*  Author: Nolberto Hanlon, MD 07/01/2023 10:28 PM  For on call review [email or web address].

## 2023-07-01 NOTE — Assessment & Plan Note (Signed)
Patient has chronic BPH as well as documented urinary obstruction.  Patient's Foley catheter was removed approximately 5 days ago because he was having discomfort from the catheter.  Here on ER evaluation, patient was noted to have at least 500 cc of urine in the bladder that could not be voided by the patient.  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  Foley catheter has been replaced.  We will monitor ins and outs.  Patient has received 3 L of lactated Ringer's.  We will trend the creatinine.

## 2023-07-01 NOTE — Assessment & Plan Note (Addendum)
Of urologic origin. CXR wnl. Resp viral panel X 4 -ve, no h/o direha available. No nuchal rigidity.  Blood and urine culture are pending.  Patient started on vancomycin plus cefepime plus Flagyl.  Got 3 L of LR +150 mL/h standing x 20 h.    Recent DC on 06/11/2023 for septic shock from UTI . Grew pseudomonas. I will treat empirically with zosyn for now.  See colovesicular fistula below.

## 2023-07-01 NOTE — ED Triage Notes (Signed)
BIBA from home for AMS and potential stroke. Fam said LKN was 0600 with him getting coffee. Per EMS LVO negative besides slurred speech. Per wife also reports slurred speech. Temp of 102.6 temproal. Hx: UTIs with recent admission of septic shock for UTI. Urinary retention. A&Ox2 person and place  HR:  BP:132/88 HR:126 SPO2- 94% RA

## 2023-07-01 NOTE — ED Provider Notes (Signed)
[place] EMERGENCY DEPARTMENT AT [facility] Provider Note   CSN: [id number] Arrival date & time: 07/01/23  1930     History Chief Complaint  Patient presents with   Code Sepsis    HPI Calvin Escobar is a 73 y.o. male presenting for chief complaint of altered mental status. Extensive medical history including history of recurrent UTIs, CKD, CHF.  Anemia, sepsis secondary to UTI. EMS was called out due to fever of 102, altered mental status Interesting, patient's mental status peers of increased improved.  He believes he got Tylenol from his wife prior to coming in and believes he has been on antibiotics.  He stated Augmentin though he was uncertain how recent that was.  Patient's recorded medical, surgical, social, medication list and allergies were reviewed in the Snapshot window as part of the initial history.   Review of Systems   Review of Systems  Constitutional:  Negative for chills and fever.  HENT:  Negative for ear pain and sore throat.   Eyes:  Negative for pain and visual disturbance.  Respiratory:  Negative for cough and shortness of breath.   Cardiovascular:  Negative for chest pain and palpitations.  Gastrointestinal:  Negative for abdominal pain and vomiting.  Genitourinary:  Negative for decreased urine volume, dysuria, hematuria and urgency.  Musculoskeletal:  Negative for arthralgias and back pain.  Skin:  Negative for color change and rash.  Neurological:  Negative for seizures and syncope.  Psychiatric/Behavioral:  Positive for confusion.   All other systems reviewed and are negative.   Physical Exam Updated Vital Signs BP 127/73   Pulse (!) 109   Temp 100 F (37.8 C) (Oral)   Resp 18   Ht 6\' 1"  (1.854 m)   Wt 103.1 kg   SpO2 95%   BMI 29.98 kg/m  Physical Exam Vitals and nursing note reviewed.  Constitutional:      General: He is not in acute distress.    Appearance: He is well-developed.  HENT:     Head: Normocephalic and  atraumatic.  Eyes:     Conjunctiva/sclera: Conjunctivae normal.  Cardiovascular:     Rate and Rhythm: Normal rate and regular rhythm.     Heart sounds: No murmur heard. Pulmonary:     Effort: Pulmonary effort is normal. No respiratory distress.     Breath sounds: Normal breath sounds.  Abdominal:     Palpations: Abdomen is soft.     Tenderness: There is no abdominal tenderness.  Musculoskeletal:        General: No swelling.     Cervical back: Neck supple.  Skin:    General: Skin is warm and dry.     Capillary Refill: Capillary refill takes less than 2 seconds.  Neurological:     Mental Status: He is alert.  Psychiatric:        Mood and Affect: Mood normal.      ED Course/ Medical Decision Making/ A&P    Procedures Procedures   Medications Ordered in ED Medications  lactated ringers infusion ( Intravenous New Bag/Given 07/01/23 2256)  lactated ringers bolus 1,000 mL (0 mLs Intravenous Stopped 07/01/23 2251)    And  lactated ringers bolus 1,000 mL (0 mLs Intravenous Stopped 07/01/23 2251)    And  lactated ringers bolus 1,000 mL (has no administration in time range)  cefTRIAXone (ROCEPHIN) 1 g in sodium chloride 0.9 % 100 mL IVPB (has no administration in time range)  ARIPiprazole (ABILIFY) tablet 2 mg (has no  administration in time range)  busPIRone (BUSPAR) tablet 10 mg (has no administration in time range)  divalproex (DEPAKOTE SPRINKLE) capsule 500 mg (has no administration in time range)  pantoprazole (PROTONIX) injection 40 mg (has no administration in time range)  gabapentin (NEURONTIN) capsule 300 mg (has no administration in time range)  midodrine (PROAMATINE) tablet 5 mg (has no administration in time range)  pravastatin (PRAVACHOL) tablet 20 mg (has no administration in time range)  primidone (MYSOLINE) tablet 50 mg (has no administration in time range)  tamsulosin (FLOMAX) capsule 0.4 mg (has no administration in time range)  traZODone (DESYREL) tablet 300 mg  (has no administration in time range)  venlafaxine XR (EFFEXOR-XR) 24 hr capsule 300 mg (has no administration in time range)  acetaminophen (TYLENOL) tablet 650 mg (has no administration in time range)    Or  acetaminophen (TYLENOL) suppository 650 mg (has no administration in time range)  polyethylene glycol (MIRALAX / GLYCOLAX) packet 17 g (has no administration in time range)  enoxaparin (LOVENOX) injection 40 mg (has no administration in time range)  sodium chloride flush (NS) 0.9 % injection 3 mL (has no administration in time range)  piperacillin-tazobactam (ZOSYN) IVPB 3.375 g (has no administration in time range)  ceFEPIme (MAXIPIME) 2 g in sodium chloride 0.9 % 100 mL IVPB (0 g Intravenous Stopped 07/01/23 2058)  metroNIDAZOLE (FLAGYL) IVPB 500 mg (0 mg Intravenous Stopped 07/01/23 2103)  vancomycin (VANCOCIN) IVPB 1000 mg/200 mL premix (0 mg Intravenous Stopped 07/01/23 2208)  acetaminophen (TYLENOL) tablet 1,000 mg (1,000 mg Oral Given 07/01/23 2103)   Medical Decision Making:   Calvin Escobar is a 73 y.o. male who presented to the ED today with multiple symptoms detailed above.    Additional history discussed with patient's family/caregivers.  Patient placed on continuous vitals and telemetry monitoring while in ED which was reviewed periodically.  Complete initial physical exam performed, notably the patient  was ill-appearing. During this initial exam, patient met criteria for activation of code sepsis due to presence of the following SIRS criteria as well as suspected infectious etiology:tachypnea,tachycardia,fever, triage CBC with leukocytosis. Reviewed and confirmed nursing documentation for past medical history, family history, social history.    Initial Assessment:   With the patient's presentation of signs and symptoms of sepsis, most likely diagnosis is bacteremia secondary to underlying infection.  Considerations for source were initiated including:Urinary tract  infections, abdominal infections such as cholecystitis/cholangitis/appendicitis, pulmonary etiology, bacteremia, skin etiology such as cellulitis or fasciitis, neurologic etiology such as meningitis or encephalitis.  This is most consistent with an acute life/limb threatening illness complicated by underlying chronic conditions.  Initial Plan:  Activated hospital protocol code sepsis including blood cultures, lactic acid screening, and further diagnostic care and management. Therapeutically, resuscitation fluids were considered. Patient has no contraindication to fluid resuscitation and therefore 30 cc of IV fluids per kilogram were administered  Therapeutically, antibiotics were administered on a broad-spectrum nature. Undifferentiated source: Vancomycin and cefepime were administered to cover gram-positive and gram-negative high risk infections Potential anaerobic infection: Metronidazole was utilized to cover potential anaerobic infections Screening labs including CBC and Metabolic panel to evaluate for infectious or metabolic etiology of disease.  Urinalysis with reflex culture ordered to evaluate for UTI or relevant urologic/nephrologic pathology.  CXR to evaluate for structural/infectious intrathoracic pathology.  EKG to evaluate for cardiac pathology Objective evaluation as below reviewed   Initial Study Results:   Laboratory  All laboratory results reviewed without evidence of clinically relevant pathology.  EKG EKG was reviewed independently. Rate, rhythm, axis, intervals all examined and without medically relevant abnormality. ST segments without concerns for elevations.    Radiology:  All images reviewed independently. Agree with radiology report at this time.   DG Chest Port 1 View Result Date: 07/01/2023 CLINICAL DATA:  Possible sepsis EXAM: PORTABLE CHEST 1 VIEW COMPARISON:  06/07/2023 FINDINGS: Cardiac shadow is stable. Poor inspiratory effort is noted. Calcified granuloma  is again noted in the right mid lung. No focal infiltrate or effusion is seen. No bony abnormality is noted. IMPRESSION: Poor inspiratory effort.  No acute abnormality noted. Electronically Signed   By: Alcide Clever M.D.   On: 07/01/2023 20:14   DG CHEST PORT 1 VIEW Result Date: 06/07/2023 CLINICAL DATA:  Shortness of breath. EXAM: PORTABLE CHEST 1 VIEW COMPARISON:  Chest radiographs 06/05/2023, 06/02/2023, 05/12/2023, 01/29/2016; CT chest 03/16/2008 FINDINGS: Cardiac silhouette and mediastinal contours are within normal limits for AP technique. Unchanged chronic benign calcified granuloma within the lateral right midlung. Moderately decreased lung volumes. Mild bilateral likely chronic interstitial thickening is unchanged from prior. No pleural effusion or pneumothorax. No acute skeletal abnormality. IMPRESSION: Moderately decreased lung volumes. No acute cardiopulmonary process. Electronically Signed   By: Neita Garnet M.D.   On: 06/07/2023 11:01   DG CHEST PORT 1 VIEW Result Date: 06/05/2023 CLINICAL DATA:  Shortness of breath. EXAM: PORTABLE CHEST 1 VIEW COMPARISON:  Chest radiograph dated 06/02/2023. FINDINGS: The heart size and mediastinal contours are within normal limits. The lung volumes are low. Both lungs are clear. The visualized skeletal structures are unremarkable. IMPRESSION: Low lung volumes. No active disease. Electronically Signed   By: Romona Curls M.D.   On: 06/05/2023 12:55   CT ABDOMEN PELVIS W WO CONTRAST Result Date: 06/02/2023 CLINICAL DATA:  73 year old male with history of dementia. Suspected urolithiasis. EXAM: CT ABDOMEN AND PELVIS WITHOUT AND WITH CONTRAST TECHNIQUE: Multidetector CT imaging of the abdomen and pelvis was performed following the standard protocol before and following the bolus administration of intravenous contrast. RADIATION DOSE REDUCTION: This exam was performed according to the departmental dose-optimization program which includes automated exposure  control, adjustment of the mA and/or kV according to patient size and/or use of iterative reconstruction technique. CONTRAST:  75mL OMNIPAQUE IOHEXOL 350 MG/ML SOLN COMPARISON:  CT of the abdomen and pelvis 05/16/2023. FINDINGS: Lower chest: Calcified granuloma in the right middle lobe. Atherosclerotic calcifications in the left main, left anterior descending and right coronary arteries. Hepatobiliary: No definite suspicious cystic or solid hepatic lesions are confidently identified on today's noncontrast CT examination. Unenhanced appearance of the gallbladder is unremarkable. Pancreas: No definite pancreatic mass or peripancreatic fluid collections or inflammatory changes are noted on today's noncontrast CT examination. Spleen: Unremarkable. Adrenals/Urinary Tract: There is a large amount of gas present in the nondependent aspect of the urinary bladder. There also appears to be some debris dependently in the urinary bladder, which also contains a small amount of gas (best appreciated on axial image 91 of series 3). Superior aspect of the urinary bladder appears thickened and intimately associated with the adjacent sigmoid colon, centered around what appears to be a large diverticulum, best appreciated on coronal image 56 of series 10. Small amount of gas in the superior wall of the urinary bladder (coronal image 57 of series 10). Moderate right and mild left hydroureteronephrosis. Small amount of gas within the collecting systems of both kidneys (right greater than left). Unenhanced appearance of the kidneys and bilateral adrenal glands is otherwise unremarkable. Stomach/Bowel:  Unenhanced appearance of the stomach is normal. No pathologic dilatation of small bowel or colon. 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 Normal appendix. Vascular/Lymphatic: Atherosclerosis in the abdominal aorta and pelvic vasculature. No lymphadenopathy noted in  the abdomen or pelvis. Reproductive: Prostate gland and seminal vesicles are unremarkable in appearance. Other: No significant volume of ascites.  No pneumoperitoneum. Musculoskeletal: There are no aggressive appearing lytic or blastic lesions noted in the visualized portions of the skeleton. IMPRESSION: 1. 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 Urologic consultation is recommended for further clinical evaluation. 2. Aortic atherosclerosis. Electronically Signed   By: Trudie Reed M.D.   On: 06/02/2023 10:19   CT Head Wo Contrast Result Date: 06/02/2023 CLINICAL DATA:  The patient fell out of bed today sustaining head and neck trauma. EXAM: CT HEAD WITHOUT CONTRAST CT CERVICAL SPINE WITHOUT CONTRAST TECHNIQUE: Multidetector CT imaging of the head and cervical spine was performed following the standard protocol without intravenous contrast. Multiplanar CT image reconstructions of the cervical spine were also generated. RADIATION DOSE REDUCTION: This exam was performed according to the departmental dose-optimization program which includes automated exposure control, adjustment of the mA and/or kV according to patient size and/or use of iterative reconstruction technique. COMPARISON:  Head CT and cervical spine CT recently both on 05/13/2023. FINDINGS: CT HEAD FINDINGS Brain: There is mild global atrophy, mild atrophic ventriculomegaly and small-vessel disease of the cerebral white matter, chronic senescent mineralization in the basal ganglia. No new abnormality concerning for a cortical based acute infarct, hemorrhage, mass or mass effect is seen and no midline shift. Basal cisterns are clear. There have been no appreciable interval changes. Vascular: No hyperdense vessel or  unexpected calcification. Skull: Negative for fractures or focal lesions. Sinuses/Orbits: Patchy membrane thickening again noted in the ethmoid air cells with mild membrane disease in the maxillary sinuses. The frontal and sphenoid sinus, bilateral mastoid air cells, and middle ears are clear. The nasal septum is S shaped. Old lens extractions with otherwise negative orbits. Other: None. CT CERVICAL SPINE FINDINGS Alignment: Reversed cervical lordosis centered at C4-5, stable, with unchanged 3 mm grade 1 C4-5 anterolisthesis. No other listhesis is seen. Bone-on-bone anterior atlantodental joint space loss is also again noted, with osteophytes. Skull base and vertebrae: There is mild osteopenia without evidence of fractures, primary bone lesions or focal pathologic process. There is chronic ankylosis across the C4-5 and C5-6 facet joints. There are anterior bridging osteophytes C4-7. Soft tissues and spinal canal: No prevertebral fluid or swelling. No visible canal hematoma. There are minimal calcifications at the carotid bifurcations. No thyroid or laryngeal mass. Disc levels: There is chronic disc collapse C5-6, C6-7, C7-T1, normal disc heights above C5. There are dorsal disc osteophyte complexes C4-5 through C7-T1, associated with spinal canal stenosis and mild spondylotic cord compression from C4-5 through C6-7. The upper levels do not show significant soft tissue or bony encroachment on the spinal canal. There is multilevel facet joint and uncinate hypertrophy, with foraminal stenosis which is severe on the left at C2-3, bilaterally severe C3-4 through C5-6, bilaterally moderate to severe C6-7. Upper chest: Negative. Other: None. IMPRESSION: 1. No acute intracranial CT findings or depressed skull fractures. 2. Atrophy and small-vessel disease. 3. 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 4. Osteopenia  and degenerative change without evidence of cervical fractures. 5. Reversed cervical lordosis with grade 1  C4-5 degenerative anterolisthesis. 6. Multilevel spinal canal and foraminal stenosis. 7. Carotid atherosclerosis. Electronically Signed   By: Almira Bar M.D.   On: 06/02/2023 07:03   CT Cervical Spine Wo Contrast Result Date: 06/02/2023 CLINICAL DATA:  The patient fell out of bed today sustaining head and neck trauma. EXAM: CT HEAD WITHOUT CONTRAST CT CERVICAL SPINE WITHOUT CONTRAST TECHNIQUE: Multidetector CT imaging of the head and cervical spine was performed following the standard protocol without intravenous contrast. Multiplanar CT image reconstructions of the cervical spine were also generated. RADIATION DOSE REDUCTION: This exam was performed according to the departmental dose-optimization program which includes automated exposure control, adjustment of the mA and/or kV according to patient size and/or use of iterative reconstruction technique. COMPARISON:  Head CT and cervical spine CT recently both on 05/13/2023. FINDINGS: CT HEAD FINDINGS Brain: There is mild global atrophy, mild atrophic ventriculomegaly and small-vessel disease of the cerebral white matter, chronic senescent mineralization in the basal ganglia. No new abnormality concerning for a cortical based acute infarct, hemorrhage, mass or mass effect is seen and no midline shift. Basal cisterns are clear. There have been no appreciable interval changes. Vascular: No hyperdense vessel or unexpected calcification. Skull: Negative for fractures or focal lesions. Sinuses/Orbits: Patchy membrane thickening again noted in the ethmoid air cells with mild membrane disease in the maxillary sinuses. The frontal and sphenoid sinus, bilateral mastoid air cells, and middle ears are clear. The nasal septum is S shaped. Old lens extractions with otherwise negative orbits. Other: None. CT CERVICAL SPINE FINDINGS Alignment: Reversed cervical lordosis centered at C4-5, stable, with unchanged 3 mm grade 1 C4-5 anterolisthesis. No other listhesis is seen.  Bone-on-bone anterior atlantodental joint space loss is also again noted, with osteophytes. Skull base and vertebrae: There is mild osteopenia without evidence of fractures, primary bone lesions or focal pathologic process. There is chronic ankylosis across the C4-5 and C5-6 facet joints. There are anterior bridging osteophytes C4-7. Soft tissues and spinal canal: No prevertebral fluid or swelling. No visible canal hematoma. There are minimal calcifications at the carotid bifurcations. No thyroid or laryngeal mass. Disc levels: There is chronic disc collapse C5-6, C6-7, C7-T1, normal disc heights above C5. There are dorsal disc osteophyte complexes C4-5 through C7-T1, associated with spinal canal stenosis and mild spondylotic cord compression from C4-5 through C6-7. The upper levels do not show significant soft tissue or bony encroachment on the spinal canal. There is multilevel facet joint and uncinate hypertrophy, with foraminal stenosis which is severe on the left at C2-3, bilaterally severe C3-4 through C5-6, bilaterally moderate to severe C6-7. Upper chest: Negative. Other: None. IMPRESSION: 1. No acute intracranial CT findings or depressed skull fractures. 2. Atrophy and small-vessel disease. 3. 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 4. Osteopenia and degenerative change without evidence of cervical fractures. 5. Reversed cervical lordosis with grade 1 C4-5 degenerative anterolisthesis. 6. Multilevel spinal canal and foraminal stenosis. 7. Carotid atherosclerosis. Electronically Signed   By: Almira Bar M.D.   On: 06/02/2023 07:03   DG Chest Port 1 View Result Date: 06/02/2023 CLINICAL DATA:  73 year old male with possible sepsis. Fall, altered mental status. EXAM: PORTABLE CHEST 1 VIEW COMPARISON:  Portable chest 05/12/2023 and earlier. FINDINGS: Portable AP semi upright view at 0546 hours. Stable low lung volumes. More rotated to the left now. Stable cardiac size and mediastinal contours. Stable  lung volumes and ventilation. Calcified right midlung  granuloma (no follow-up imaging recommended). No pneumothorax, pleural effusion, acute pulmonary opacity. Paucity of bowel gas the visible abdomen. No acute osseous abnormality identified. IMPRESSION: Chronically Low lung volumes. No acute cardiopulmonary abnormality or acute traumatic injury identified. Electronically Signed   By: Odessa Fleming M.D.   On: 06/02/2023 05:59  Disposition:   Based on the above findings, I believe this patient is stable for admission.    Patient/family educated about specific findings on our evaluation and explained exact reasons for admission.  Patient/family educated about clinical situation and time was allowed to answer questions.   Admission team communicated with and agreed with need for admission. Patient admitted. Patient  ready to move at this time.     Emergency Department Medication Summary:   Medications  lactated ringers infusion ( Intravenous New Bag/Given 07/01/23 2256)  lactated ringers bolus 1,000 mL (0 mLs Intravenous Stopped 07/01/23 2251)    And  lactated ringers bolus 1,000 mL (0 mLs Intravenous Stopped 07/01/23 2251)    And  lactated ringers bolus 1,000 mL (has no administration in time range)  cefTRIAXone (ROCEPHIN) 1 g in sodium chloride 0.9 % 100 mL IVPB (has no administration in time range)  ARIPiprazole (ABILIFY) tablet 2 mg (has no administration in time range)  busPIRone (BUSPAR) tablet 10 mg (has no administration in time range)  divalproex (DEPAKOTE SPRINKLE) capsule 500 mg (has no administration in time range)  pantoprazole (PROTONIX) injection 40 mg (has no administration in time range)  gabapentin (NEURONTIN) capsule 300 mg (has no administration in time range)  midodrine (PROAMATINE) tablet 5 mg (has no administration in time range)  pravastatin (PRAVACHOL) tablet 20 mg (has no administration in time range)  primidone (MYSOLINE) tablet 50 mg (has no administration in time range)   tamsulosin (FLOMAX) capsule 0.4 mg (has no administration in time range)  traZODone (DESYREL) tablet 300 mg (has no administration in time range)  venlafaxine XR (EFFEXOR-XR) 24 hr capsule 300 mg (has no administration in time range)  acetaminophen (TYLENOL) tablet 650 mg (has no administration in time range)    Or  acetaminophen (TYLENOL) suppository 650 mg (has no administration in time range)  polyethylene glycol (MIRALAX / GLYCOLAX) packet 17 g (has no administration in time range)  enoxaparin (LOVENOX) injection 40 mg (has no administration in time range)  sodium chloride flush (NS) 0.9 % injection 3 mL (has no administration in time range)  piperacillin-tazobactam (ZOSYN) IVPB 3.375 g (has no administration in time range)  ceFEPIme (MAXIPIME) 2 g in sodium chloride 0.9 % 100 mL IVPB (0 g Intravenous Stopped 07/01/23 2058)  metroNIDAZOLE (FLAGYL) IVPB 500 mg (0 mg Intravenous Stopped 07/01/23 2103)  vancomycin (VANCOCIN) IVPB 1000 mg/200 mL premix (0 mg Intravenous Stopped 07/01/23 2208)  acetaminophen (TYLENOL) tablet 1,000 mg (1,000 mg Oral Given 07/01/23 2103)         Clinical Impression: No diagnosis found.   Admit   Final Clinical Impression(s) / ED Diagnoses Final diagnoses:  None    Rx / DC Orders ED Discharge Orders     None         Glyn Ade, MD 07/01/23 2259

## 2023-07-02 ENCOUNTER — Inpatient Hospital Stay (HOSPITAL_COMMUNITY): Payer: Medicare HMO

## 2023-07-02 DIAGNOSIS — A419 Sepsis, unspecified organism: Secondary | ICD-10-CM | POA: Diagnosis not present

## 2023-07-02 DIAGNOSIS — Z7189 Other specified counseling: Secondary | ICD-10-CM

## 2023-07-02 DIAGNOSIS — Z515 Encounter for palliative care: Secondary | ICD-10-CM

## 2023-07-02 LAB — CBC
HCT: 27.1 % — ABNORMAL LOW (ref 39.0–52.0)
HCT: 27.4 % — ABNORMAL LOW (ref 39.0–52.0)
Hemoglobin: 8.1 g/dL — ABNORMAL LOW (ref 13.0–17.0)
Hemoglobin: 8.4 g/dL — ABNORMAL LOW (ref 13.0–17.0)
MCH: 26.7 pg (ref 26.0–34.0)
MCH: 27.1 pg (ref 26.0–34.0)
MCHC: 29.9 g/dL — ABNORMAL LOW (ref 30.0–36.0)
MCHC: 30.7 g/dL (ref 30.0–36.0)
MCV: 88.4 fL (ref 80.0–100.0)
MCV: 89.4 fL (ref 80.0–100.0)
Platelets: 156 10*3/uL (ref 150–400)
Platelets: 178 10*3/uL (ref 150–400)
RBC: 3.03 MIL/uL — ABNORMAL LOW (ref 4.22–5.81)
RBC: 3.1 MIL/uL — ABNORMAL LOW (ref 4.22–5.81)
RDW: 16.8 % — ABNORMAL HIGH (ref 11.5–15.5)
RDW: 17 % — ABNORMAL HIGH (ref 11.5–15.5)
WBC: 10.9 10*3/uL — ABNORMAL HIGH (ref 4.0–10.5)
WBC: 11.6 10*3/uL — ABNORMAL HIGH (ref 4.0–10.5)
nRBC: 0 % (ref 0.0–0.2)
nRBC: 0 % (ref 0.0–0.2)

## 2023-07-02 LAB — BASIC METABOLIC PANEL
Anion gap: 10 (ref 5–15)
BUN: 15 mg/dL (ref 8–23)
CO2: 22 mmol/L (ref 22–32)
Calcium: 8.3 mg/dL — ABNORMAL LOW (ref 8.9–10.3)
Chloride: 99 mmol/L (ref 98–111)
Creatinine, Ser: 1.45 mg/dL — ABNORMAL HIGH (ref 0.61–1.24)
GFR, Estimated: 51 mL/min — ABNORMAL LOW (ref >60)
Glucose, Bld: 116 mg/dL — ABNORMAL HIGH (ref 70–99)
Potassium: 4 mmol/L (ref 3.5–5.1)
Sodium: 131 mmol/L — ABNORMAL LOW (ref 135–145)

## 2023-07-02 LAB — PROTIME-INR
INR: 1.2 (ref 0.8–1.2)
Prothrombin Time: 15.7 s — ABNORMAL HIGH (ref 11.4–15.2)

## 2023-07-02 LAB — CREATININE, SERUM
Creatinine, Ser: 1.52 mg/dL — ABNORMAL HIGH (ref 0.61–1.24)
GFR, Estimated: 48 mL/min — ABNORMAL LOW (ref >60)

## 2023-07-02 LAB — TROPONIN I (HIGH SENSITIVITY): Troponin I (High Sensitivity): 21 ng/L — ABNORMAL HIGH (ref <18)

## 2023-07-02 LAB — CORTISOL-AM, BLOOD: Cortisol - AM: 19.6 ug/dL (ref 6.7–22.6)

## 2023-07-02 LAB — APTT: aPTT: 33 s (ref 24–36)

## 2023-07-02 MED ORDER — HALOPERIDOL LACTATE 5 MG/ML IJ SOLN
1.0000 mg | Freq: Four times a day (QID) | INTRAMUSCULAR | Status: DC | PRN
  Administered 2023-07-02 – 2023-07-03 (×2): 2 mg via INTRAVENOUS
  Filled 2023-07-02 (×2): qty 1

## 2023-07-02 NOTE — Consult Note (Signed)
Palliative Medicine Inpatient Consult Note  Consulting Provider: Dr. Hanley Ben  Reason for consult:   Palliative Care Consult Services Palliative Medicine Consult  Reason for Consult? goals of care   07/02/2023  HPI:  Per intake H&P --> Calvin Escobar is a 73 y.o. male presenting for chief complaint of altered mental status. Extensive medical history including history of recurrent UTIs, CKD, CHF.  Anemia, sepsis secondary to UTI. Palliative care asked to get involved to further address goals of care.   Clinical Assessment/Goals of Care:  *Please note that this is a verbal dictation therefore any spelling or grammatical errors are due to the "Dragon Medical One" system interpretation.  I have reviewed medical records including EPIC notes, labs and imaging, received report from bedside RN, assessed the patient who is on the ER gurney, he is  generally disoriented   I called and spoke to patients spouse, Calvin Escobar to further discuss diagnosis prognosis, GOC, EOL wishes, disposition and options.   I introduced Palliative Medicine as specialized medical care for people living with serious illness. It focuses on providing relief from the symptoms and stress of a serious illness. The goal is to improve quality of life for both the patient and the family.  Medical History Review and Understanding:  A review of Calvin Escobar past medical history significant for chronic kidney disease, congestive heart failure, depression, dementia, obstructive sleep apnea, ascending aortic dilation, bipolar disorder, and colovesicular fistula was held.  Social History:  Calvin Escobar.  He has been married to his wife Calvin Escobar for the past 52 years.  He has a 9 year old son.  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 He is a man of faith practicing within the [employer] denomination.   Functional and Nutritional State:  Preceding hospitalization, Calvin Escobar was living at home with his wife. She  shares that he has generally been a "lazy" man for the past 15-16 years since be stopped drinking. He is hard to motivate to do things for himself and is mostly sedentary. He has had an overall decrease in his appetite.   Advance Directives:  A detailed discussion was had today regarding advanced directives.  Patient does not have a living will and his spouse, Calvin Escobar is his surrogate Management consultant.   Code Status:  Concepts specific to code status, artifical feeding and hydration, continued IV antibiotics and rehospitalization was had.  The difference between a aggressive medical intervention path  and a palliative comfort care path for this patient at this time was had.   Encouraged patient/family to consider DNR/DNI status understanding evidenced based poor outcomes in similar hospitalized patient, as the cause of arrest is likely associated with advanced chronic/terminal illness rather than an easily reversible acute cardio-pulmonary event. I explained that DNR/DNI does not change the medical plan and it only comes into effect after a person has arrested (died).  It is a protective measure to keep [place] from harming the patient in their last moments of life. Calvin Escobar was agreeable to DNR/DNI with understanding that patient would not receive CPR, defibrillation, ACLS medications, or intubation.   Discussion:  Discussed [name]'s decline over the years from a mental, emotional, and physical perspective. Calvin Escobar accounts for how frightening it was seeing her husband, "face down on the couch" preceding his hospitalization.   [name] has struggled with his health for many years and he been very difficult to motivate for self improvement. Calvin Escobar and I reviewed the chronic disease trajectory. She is understanding of his acute on chronic  disease processes.  The goals at this time are for improvement.   Discussed the importance of continued conversation with family and their  medical providers regarding  overall plan of care and treatment options, ensuring decisions are within the context of the patients values and GOCs.  Decision Maker: Calvin Escobar,Calvin Escobar (Spouse): [phone] (Mobile)   SUMMARY OF RECOMMENDATIONS   DNAR/DNI   Will try to complete a MOST form prior to patient discharge  Discussions held related to [name]'s acute on chronic disease burden  Allow time for outcomes  Goals are for clinical improvement  The Palliative medicine will continue to follow along  Code Status/Advance Care Planning: DNAR/DNI  Palliative Prophylaxis:  Aspiration, Bowel Regimen, Delirium Protocol, Frequent Pain Assessment, Oral Care, Palliative Wound Care, and Turn Reposition  Additional Recommendations (Limitations, Scope, Preferences): Continue present care  Psycho-social/Spiritual:  Desire for further Chaplaincy support: Yes Additional Recommendations: Education on chronic disease   Prognosis: High 12 month mortality risk in the setting of multiple chronic co-morbidities, recurrent rehospitalization's, and generalized deconditioning  Discharge Planning: Discharge plan is to be determined.  Vitals:   07/02/23 1100 07/02/23 1200  BP:  105/74  Pulse: (!) 107 (!) 106  Resp: 18 20  Temp: 98.9 F (37.2 C) 98.8 F (37.1 C)  SpO2: 94% 98%    Intake/Output Summary (Last 24 hours) at 07/02/2023 1342 Last data filed at 07/02/2023 4782 Gross per 24 hour  Intake 3536.49 ml  Output 1800 ml  Net 1736.49 ml   Last Weight  Most recent update: 07/01/2023  7:39 PM    Weight  103.1 kg (227 lb 3.2 oz)            Gen: Elderly Caucasian M chronically ill in appearance HEENT: moist mucous membranes CV: Regular rate and rhythm  PULM:  On RA, breathing is even and nonlabored ABD: soft/nontender  EXT: No edema  Neuro: Alert and oriented to self only  PPS: 30%   This conversation/these recommendations were discussed with patient primary care team, Dr. Hanley Ben  Billing based on MDM:  High ______________________________________________________ [name] [facility] Palliative Medicine Team Team Cell Phone: [phone] Please utilize secure chat with additional questions, if there is no response within 30 minutes please call the above phone number  Palliative Medicine Team providers are available by phone from 7am to 7pm daily and can be reached through the team cell phone.  Should this patient require assistance outside of these hours, please call the patient's attending physician.

## 2023-07-02 NOTE — ED Notes (Signed)
Pt found on ground, at the foot of the bed with mitts on, and  Feces smeared on ground, feet, back, and head. with bed locked and both side rails intact.

## 2023-07-02 NOTE — Progress Notes (Signed)
PROGRESS NOTE    Calvin Escobar  ZHY:[id number] DOB: 1951/01/27 DOA: 07/01/2023 PCP: [name], MD   Brief Narrative:  73 year old male with history of hypertension, hyperlipidemia, BPH, chronic systolic heart failure, bipolar disorder, dementia, OSA on CPAP, urinary retention with indwelling Foley catheter, hospitalization from 05/12/2023 to 05/16/2023 for E. coli bacteremia/emphysematous cystitis and confusion treated with antibiotics followed by hospitalization again on 06/02/2023-06/11/2023 for severe sepsis from Pseudomonas UTI with concern for colovesical fistula treated with IV antibiotics and urology and surgery recommending conservative management with outpatient follow-up had presented to PCP 5 days prior to presentation to the ED with penile pain and indwelling catheter was removed per patient's request.  She presented to the ED with fever, confusion.  On presentation, temperature was 100 with tachycardia and tachypnea.  White count was 11.8.  UA was suggestive of UTI.  He was found to have AKI.  He was started on IV fluids and antibiotics.  Assessment & Plan:   Sepsis: Present on admission UTI: Present on admission Chronic BPH with history of chronic indwelling Foley catheter recently removed as an outpatient AKI: Present on admission -Presented with fever, tachycardia, tachypnea, leukocytosis with AKI and UTI and urinary retention -Foley catheter placed in the ED.  Continue broad-spectrum antibiotics.  Follow cultures.  Creatinine 1.45 today.  Monitor. -RSV/COVID/influenza PCR negative on presentation -Blood pressure currently stable.  Continue Flomax.  Outpatient follow-up with urology  Leukocytosis -Resolved  Anemia of chronic disease -From chronic illnesses.  Hemoglobin currently stable.  Monitor intermittently  Hyponatremia -Mild.  Monitor.  Encourage oral intake  Hyperlipidemia -Continue statin  Dementia -Fall precautions.  Delirium precautions.  PT  eval.  Recent diagnosis of colovesical fistula -Patient was diagnosed with colovesical fistula during recent admission.  Urology and general surgery recommended conservative management with no surgical intervention and outpatient follow-up with urology.  If conservative management fails, percutaneous nephrostomy might need to be considered. -Patient recently had a cystoscopy which had not shown any evidence of colovesical fistula.  Prior to any colectomy, patient will need to undergo colonoscopy and cardiac clearance as an outpatient  Recurrent fall -Fall precautions.  PT eval  Chronic systolic heart failure -Strict input and output.  Daily weights.  Currently not volume overloaded.  Echo from December 2024 had shown EF of 50 to 55%; echo from October 2024 had shown EF of 35 to 40%. -Outpatient follow-up with cardiology -Goal-directed medical treatment was held during recent hospitalization because of hypotension and patient was on midodrine -DC IV fluids.  Chronic tremors -Continue primidone  OSA -Continue CPAP at night  Bipolar disorder -Continue home regimen with BuSpar, Abilify, Depakote, trazodone  Goals of care -Overall prognosis is guarded to poor because of recent recurrent hospitalizations.  Currently listed as full code.  Consult palliative care for goals of care discussion  Dysphagia -Patient was seen by SLP during recent hospitalization and was placed on dysphagia diet.   DVT prophylaxis: Lovenox Code Status: Full Family Communication: None at bedside Disposition Plan: Status is: Inpatient Remains inpatient appropriate because: Of severity of illness    Consultants: Consult palliative care  Procedures: None  Antimicrobials:  Anti-infectives (From admission, onward)    Start     Dose/Rate Route Frequency Ordered Stop   07/01/23 2300  cefTRIAXone (ROCEPHIN) 1 g in sodium chloride 0.9 % 100 mL IVPB        1 g 200 mL/hr over 30 Minutes Intravenous Daily at 10  pm 07/01/23 2242 07/06/23 2159   07/01/23  2300  piperacillin-tazobactam (ZOSYN) IVPB 3.375 g        3.375 g 12.5 mL/hr over 240 Minutes Intravenous Every 8 hours 07/01/23 2258     07/01/23 1945  ceFEPIme (MAXIPIME) 2 g in sodium chloride 0.9 % 100 mL IVPB        2 g 200 mL/hr over 30 Minutes Intravenous  Once 07/01/23 1939 07/01/23 2058   07/01/23 1945  metroNIDAZOLE (FLAGYL) IVPB 500 mg        500 mg 100 mL/hr over 60 Minutes Intravenous  Once 07/01/23 1939 07/01/23 2103   07/01/23 1945  vancomycin (VANCOCIN) IVPB 1000 mg/200 mL premix        1,000 mg 200 mL/hr over 60 Minutes Intravenous  Once 07/01/23 1939 07/01/23 2208        Subjective: Patient seen and examined at bedside.  Poor historian.  No seizures, vomiting, agitation reported.  Objective: Vitals:   07/02/23 0600 07/02/23 0610 07/02/23 0630 07/02/23 0700  BP: 122/72   136/77  Pulse:  89 95 94  Resp: (!) 29 19 16  (!) 30  Temp:    98.6 F (37 C)  TempSrc:    Oral  SpO2: 93% 99% 94% 98%  Weight:      Height:        Intake/Output Summary (Last 24 hours) at 07/02/2023 0829 Last data filed at 07/02/2023 0617 Gross per 24 hour  Intake 3536.49 ml  Output 1800 ml  Net 1736.49 ml   Filed Weights   07/01/23 1939  Weight: 103.1 kg    Examination:  General exam: Appears calm and comfortable.  Looks chronically ill and deconditioned.  Currently on room air. Respiratory system: Bilateral decreased breath sounds at bases with scattered crackles Cardiovascular system: S1 & S2 heard, intermittently tachycardic Gastrointestinal system: Abdomen is nondistended, soft and nontender. Normal bowel sounds heard. Extremities: No cyanosis, clubbing, edema  Central nervous system: Awake, slow to respond, confused, poor historian.  No focal neurological deficits. Moving extremities Skin: No rashes, lesions or ulcers Psychiatry: Flat affect and not agitated Genitourinary: Foley catheter present    Data Reviewed: I have  personally reviewed following labs and imaging studies  CBC: Recent Labs  Lab 07/01/23 1953 07/02/23 0018 07/02/23 0222  WBC 11.8* 11.6* 10.9*  NEUTROABS 8.9*  --   --   HGB 8.8* 8.4* 8.1*  HCT 28.9* 27.4* 27.1*  MCV 89.8 88.4 89.4  PLT 181 178 156   Basic Metabolic Panel: Recent Labs  Lab 07/01/23 1953 07/02/23 0018 07/02/23 0222  NA 133*  --  131*  K 4.5  --  4.0  CL 99  --  99  CO2 23  --  22  GLUCOSE 134*  --  116*  BUN 16  --  15  CREATININE 1.49* 1.52* 1.45*  CALCIUM 8.7*  --  8.3*   GFR: Estimated Creatinine Clearance: 56.4 mL/min (A) (by C-G formula based on SCr of 1.45 mg/dL (H)). Liver Function Tests: Recent Labs  Lab 07/01/23 1953  AST 17  ALT 9  ALKPHOS 49  BILITOT 0.7  PROT 7.4  ALBUMIN 3.0*   No results for input(s): "LIPASE", "AMYLASE" in the last 168 hours. No results for input(s): "AMMONIA" in the last 168 hours. Coagulation Profile: Recent Labs  Lab 07/01/23 1953 07/02/23 0222  INR 1.2 1.2   Cardiac Enzymes: No results for input(s): "CKTOTAL", "CKMB", "CKMBINDEX", "TROPONINI" in the last 168 hours. BNP (last 3 results) No results for input(s): "PROBNP" in the last 8760  hours. HbA1C: No results for input(s): "HGBA1C" in the last 72 hours. CBG: No results for input(s): "GLUCAP" in the last 168 hours. Lipid Profile: No results for input(s): "CHOL", "HDL", "LDLCALC", "TRIG", "CHOLHDL", "LDLDIRECT" in the last 72 hours. Thyroid Function Tests: No results for input(s): "TSH", "T4TOTAL", "FREET4", "T3FREE", "THYROIDAB" in the last 72 hours. Anemia Panel: No results for input(s): "VITAMINB12", "FOLATE", "FERRITIN", "TIBC", "IRON", "RETICCTPCT" in the last 72 hours. Sepsis Labs: Recent Labs  Lab 07/01/23 2000 07/01/23 2314  LATICACIDVEN 1.1 0.8    Recent Results (from the past 240 hours)  Resp panel by RT-PCR (RSV, Flu A&B, Covid) Anterior Nasal Swab     Status: None   Collection Time: 07/01/23  7:47 PM   Specimen: Anterior Nasal  Swab  Result Value Ref Range Status   SARS Coronavirus 2 by RT PCR NEGATIVE NEGATIVE Final   Influenza A by PCR NEGATIVE NEGATIVE Final   Influenza B by PCR NEGATIVE NEGATIVE Final    Comment: (NOTE) The Xpert Xpress SARS-CoV-2/FLU/RSV plus assay is intended as an aid in the diagnosis of influenza from Nasopharyngeal swab specimens and should not be used as a sole basis for treatment. Nasal washings and aspirates are unacceptable for Xpert Xpress SARS-CoV-2/FLU/RSV testing.  Fact Sheet for Patients: BloggerCourse.com  Fact Sheet for Healthcare Providers: SeriousBroker.it  This test is not yet approved or cleared by the [place] FDA and has been authorized for detection and/or diagnosis of SARS-CoV-2 by FDA under an Emergency Use Authorization (EUA). This EUA will remain in effect (meaning this test can be used) for the duration of the COVID-19 declaration under Section 564(b)(1) of the Act, 21 U.S.C. section 360bbb-3(b)(1), unless the authorization is terminated or revoked.     Resp Syncytial Virus by PCR NEGATIVE NEGATIVE Final    Comment: (NOTE) Fact Sheet for Patients: BloggerCourse.com  Fact Sheet for Healthcare Providers: SeriousBroker.it  This test is not yet approved or cleared by the [place] FDA and has been authorized for detection and/or diagnosis of SARS-CoV-2 by FDA under an Emergency Use Authorization (EUA). This EUA will remain in effect (meaning this test can be used) for the duration of the COVID-19 declaration under Section 564(b)(1) of the Act, 21 U.S.C. section 360bbb-3(b)(1), unless the authorization is terminated or revoked.  Performed at [facility], [address], [place], [place] [address]   Urine Culture     Status: None (Preliminary result)   Collection Time: 07/01/23  8:57 PM   Specimen: Urine, Clean Catch  Result Value Ref  Range Status   Specimen Description URINE, CLEAN CATCH  Final   Special Requests   Final    NONE Reflexed from [id number] Performed at [facility], [address] [place], [place] [address]    Culture PENDING  Incomplete   Report Status PENDING  Incomplete         Radiology Studies: DG Chest Port 1 View Result Date: 07/01/2023 CLINICAL DATA:  Possible sepsis EXAM: PORTABLE CHEST 1 VIEW COMPARISON:  06/07/2023 FINDINGS: Cardiac shadow is stable. Poor inspiratory effort is noted. Calcified granuloma is again noted in the right mid lung. No focal infiltrate or effusion is seen. No bony abnormality is noted. IMPRESSION: Poor inspiratory effort.  No acute abnormality noted. Electronically Signed   By: Alcide Clever M.D.   On: 07/01/2023 20:14        Scheduled Meds:  ARIPiprazole  2 mg Oral Daily   busPIRone  10 mg Oral TID  divalproex  500 mg Oral QHS   enoxaparin (LOVENOX) injection  40 mg Subcutaneous Q24H   gabapentin  300 mg Oral QHS   midodrine  5 mg Oral TID WC   pantoprazole (PROTONIX) IV  40 mg Intravenous Q24H   pravastatin  20 mg Oral q1800   primidone  50 mg Oral BID   sodium chloride flush  3 mL Intravenous Q12H   tamsulosin  0.4 mg Oral QPC supper   traZODone  300 mg Oral QHS   venlafaxine XR  300 mg Oral Q breakfast   Continuous Infusions:  cefTRIAXone (ROCEPHIN)  IV Stopped (07/01/23 2345)   lactated ringers 150 mL/hr at 07/02/23 0741   piperacillin-tazobactam (ZOSYN)  IV 3.375 g (07/02/23 0509)          Glade Lloyd, MD [facility] 07/02/2023, 8:29 AM

## 2023-07-02 NOTE — ED Notes (Signed)
Pt found down on ground by this RN. Pt found at foot of gurney, with wheels locked and bilateral side rails in place, with bed alarm in place and sounding.  Pt covered in his own feces, on floor, bottoms of feet, buttocks, back and top of head. Pt sustained scratches to lower back. No other injuries noted.  Dr. Maple Hudson, [facility] Doc, Charge RN, and Admitting Doc Dr. Hanley Ben notified via secure chat.  Multiple staff lifted pt and cleaned pt, returning him to bed with bed alarm.  One to One safety sit order per verbal order. Will continue to monitor for mentation changes if any.

## 2023-07-02 NOTE — Progress Notes (Signed)
Pt lying in bed in room 30.  Pt has no  IV's and had just pulled one out of his left wrist.  Cath intact.  Pt alert and oriented to person and place only.  Pt does not remember pulling out IV's.  Pt does remember falling in the ED today.  Order for telesitter but none available.  No sitters available either.  Pt requested to speak to his wife and this Clinical research associate tried several times to reach her by phone unsuccessfully.  2 messages left.  Pt denies pain at this time.  Foley draining cloudy yellow urine.  Sinus tachy 107 on the monitor.

## 2023-07-02 NOTE — ED Notes (Signed)
Provider made aware that pt keeps pulling at IV's, tele, and foley. Attempted to get out of bed. Pt has blood in urine. Provider suggested restraints, if needed. Pt amenable to mitts to help remind him not to pull on things or pick at IV's

## 2023-07-03 ENCOUNTER — Inpatient Hospital Stay (HOSPITAL_COMMUNITY): Payer: Medicare HMO

## 2023-07-03 ENCOUNTER — Encounter (HOSPITAL_COMMUNITY): Payer: Self-pay | Admitting: Internal Medicine

## 2023-07-03 DIAGNOSIS — Z515 Encounter for palliative care: Secondary | ICD-10-CM | POA: Diagnosis not present

## 2023-07-03 DIAGNOSIS — Z7189 Other specified counseling: Secondary | ICD-10-CM | POA: Diagnosis not present

## 2023-07-03 LAB — CBC WITH DIFFERENTIAL/PLATELET
Abs Immature Granulocytes: 0.06 10*3/uL (ref 0.00–0.07)
Basophils Absolute: 0.1 10*3/uL (ref 0.0–0.1)
Basophils Relative: 1 %
Eosinophils Absolute: 0.3 10*3/uL (ref 0.0–0.5)
Eosinophils Relative: 3 %
HCT: 24.9 % — ABNORMAL LOW (ref 39.0–52.0)
Hemoglobin: 7.7 g/dL — ABNORMAL LOW (ref 13.0–17.0)
Immature Granulocytes: 1 %
Lymphocytes Relative: 16 %
Lymphs Abs: 1.4 10*3/uL (ref 0.7–4.0)
MCH: 27 pg (ref 26.0–34.0)
MCHC: 30.9 g/dL (ref 30.0–36.0)
MCV: 87.4 fL (ref 80.0–100.0)
Monocytes Absolute: 1.2 10*3/uL — ABNORMAL HIGH (ref 0.1–1.0)
Monocytes Relative: 14 %
Neutro Abs: 5.7 10*3/uL (ref 1.7–7.7)
Neutrophils Relative %: 65 %
Platelets: 149 10*3/uL — ABNORMAL LOW (ref 150–400)
RBC: 2.85 MIL/uL — ABNORMAL LOW (ref 4.22–5.81)
RDW: 16.5 % — ABNORMAL HIGH (ref 11.5–15.5)
WBC: 8.7 10*3/uL (ref 4.0–10.5)
nRBC: 0 % (ref 0.0–0.2)

## 2023-07-03 LAB — COMPREHENSIVE METABOLIC PANEL
ALT: 10 U/L (ref 0–44)
AST: 17 U/L (ref 15–41)
Albumin: 2.4 g/dL — ABNORMAL LOW (ref 3.5–5.0)
Alkaline Phosphatase: 41 U/L (ref 38–126)
Anion gap: 12 (ref 5–15)
BUN: 14 mg/dL (ref 8–23)
CO2: 24 mmol/L (ref 22–32)
Calcium: 8.4 mg/dL — ABNORMAL LOW (ref 8.9–10.3)
Chloride: 100 mmol/L (ref 98–111)
Creatinine, Ser: 1.3 mg/dL — ABNORMAL HIGH (ref 0.61–1.24)
GFR, Estimated: 58 mL/min — ABNORMAL LOW (ref >60)
Glucose, Bld: 102 mg/dL — ABNORMAL HIGH (ref 70–99)
Potassium: 3.4 mmol/L — ABNORMAL LOW (ref 3.5–5.1)
Sodium: 136 mmol/L (ref 135–145)
Total Bilirubin: 0.6 mg/dL (ref 0.0–1.2)
Total Protein: 6.3 g/dL — ABNORMAL LOW (ref 6.5–8.1)

## 2023-07-03 LAB — MAGNESIUM: Magnesium: 1.9 mg/dL (ref 1.7–2.4)

## 2023-07-03 MED ORDER — POTASSIUM CHLORIDE CRYS ER 20 MEQ PO TBCR
60.0000 meq | EXTENDED_RELEASE_TABLET | Freq: Once | ORAL | Status: AC
Start: 2023-07-03 — End: 2023-07-03
  Administered 2023-07-03: 60 meq via ORAL
  Filled 2023-07-03: qty 3

## 2023-07-03 MED ORDER — ALBUTEROL SULFATE (2.5 MG/3ML) 0.083% IN NEBU
2.5000 mg | INHALATION_SOLUTION | RESPIRATORY_TRACT | Status: DC | PRN
  Administered 2023-07-03: 2.5 mg via RESPIRATORY_TRACT
  Filled 2023-07-03: qty 3

## 2023-07-03 NOTE — Progress Notes (Signed)
800cc bloody urine drained from foley bag.

## 2023-07-03 NOTE — Progress Notes (Signed)
Transition of Care [facility]) - Inpatient Brief Assessment   Patient Details  Name: Calvin Escobar MRN: [id number] Date of Birth: August 20, 1950  Transition of Care [facility]) CM/SW Contact:    Janae Bridgeman, RN Phone Number: 07/03/2023, 2:49 PM   Clinical Narrative: CM called and left a detailed voicemessage with the patient's wife by phone to discuss TOC needs.  The patient was seen by Palliative Care NP this morning and hospice services were recommended.  I called and spoke with Glenna Fellows, RNCM with [facility] and he left a detailed message with the patient's wife this morning and is waiting to discuss home hospice services with the wife by phone.  The patient was seen by PT and return to home was recommended.  I update [facility] and will follow up with Julien Girt, RN and the patient's wife.  Patient is ambulatory and does not have available Medicaid listed and I plan to follow up with the wife to discuss discharge options to home with hospice care.   Transition of Care Asessment: Insurance and Status: (P) Insurance coverage has been reviewed Patient has primary care physician: (P) Yes Home environment has been reviewed: (P) from home with spouse - Prior level of function:: (P) family assistance Prior/Current Home Services: (P) No current home services Social Drivers of Health Review: (P) SDOH reviewed needs interventions Readmission risk has been reviewed: (P) Yes Transition of care needs: (P) transition of care needs identified, TOC will continue to follow

## 2023-07-03 NOTE — Progress Notes (Signed)
Pt called out as I was walking by room, pt oob with mitts off.  Fecal matter on hands and bottom.  Pt assisted to toilet and cleansed.  Pt had small bm.  Gown changed.  Hands and bttom cleansed.  Pt assisted back to bed.  Mitts applied.  Bed alarm on.

## 2023-07-03 NOTE — Progress Notes (Signed)
TRH night cross cover note:   Prn alb nebulizer added for wheezing.    Newton Pigg, DO Hospitalist

## 2023-07-03 NOTE — Consult Note (Signed)
[facility] Liaison Consult Note    07/03/2023  Calvin Escobar [facility] 07-15-1950 [id number]  Covering Calvin Escobar [facility] hospital liaison).  Primary Care Provider:  Dr. Reine [facility].  Patient is currently active with Care Management for chronic disease management services.  Patient has been engaged by a Tourist information centre manager.  Our community based plan of care has focused on disease management and community resource support.   Patient will receive a post hospital call and will be evaluated for assessments and disease process education.  Liaison visited pt at the bedside however no family available.   Plan: Pending discharge disposition  Inpatient Transition Of Care [TOC] team member to make aware that Care Management following.  Of note, Care Management services does not replace or interfere with any services that are needed or arranged by inpatient [facility] care management team.   For additional questions or referrals please contact:  Calvin Cousin, RN, [facility] Liaison [place]   [facility], Population Health Office Hours MTWF  8:00 am-6:00 pm Direct Dial: [phone] mobile [phone] [Office toll free line] Office Hours are M-F 8:30 - 5 pm [email or web address]

## 2023-07-03 NOTE — Progress Notes (Addendum)
Palliative Medicine Inpatient Follow Up Note HPI: Calvin Escobar is a 73 y.o. male presenting for chief complaint of altered mental status. Extensive medical history including history of recurrent UTIs, CKD, CHF.  Anemia, sepsis secondary to UTI. Palliative care asked to get involved to further address goals of care.   Today's Discussion 07/03/2023  *Please note that this is a verbal dictation therefore any spelling or grammatical errors are due to the "[employer] One" system interpretation.  Chart reviewed inclusive of vital signs, progress notes, laboratory results, and diagnostic images. Patient had a difficult night inclusive of a fall.   I spoke with patients wife, Calvin Escobar this morning. We discussed patients present health and how he has been declining fairly quickly over the last few months. She shares that he has had recent falls and does worry that she and her son are limited in what they can do for him in the home given his present needs. Calvin Escobar shares that she has 1.5 yrs until she can retire and be home with Calvin Escobar all of the time. I was very honest in sharing that I am not sure he will sustain for that period of time as things are evolving now. She was very tearful but she understood this and has thought it herself. She asks about how to optimize his care, I shared that I would ask the MSW team to support her and see what is possible in terms of in home services.  We discussed if Calvin Escobar's deterioration continues the idea of hospice care. I described hospice as a service for patients who have a life expectancy of 6 months or less. The goal of hospice is the preservation of dignity and quality at the end phases of life.  Under hospice care, the focus changes from curative to symptom relief.  We discussed home hospice and the thought of eventual inpatient hospice if and when Calvin Escobar is in his final days. We discussed hospice's respite care program as well. Patients wife does agree to  speaking to hospice to learn more about their services and prefers [facility] in [place].   We discussed advanced care planning. Calvin Escobar is hopeful that Calvin Escobar's mental state clears as she has spoken to an outside agency who will come in to complete these documents with him.   We reviewed  Calvin Escobar's complications hospitalization as of presently due to his delirium. I shared ways in which we are trying to re-orient him.   I met with Calvin Escobar at bedside, he is aware of self and that he is in Calvin Escobar though is not aware of being in the hospital. Discussed reason(s) for hospitalization. He is not in any distress this morning.   PT/OT will see patient today.  Patients Rns note no new events this morning.  _____________________________ Addendum:  I spoke to patients wife, Calvin Escobar this afternoon as she was requesting an update. Discussed patients present clinical state, decline in fevers, and increase in metal awareness.   Questions and concerns addressed/Palliative Support Provided.   Time: 15  Objective Assessment: Vital Signs Vitals:   07/03/23 0419 07/03/23 0528  BP:  125/77  Pulse:  (!) 105  Resp:  18  Temp:  99.2 F (37.3 C)  SpO2: 98% 96%    Intake/Output Summary (Last 24 hours) at 07/03/2023 0841 Last data filed at 07/03/2023 0400 Gross per 24 hour  Intake --  Output 1750 ml  Net -1750 ml   Last Weight  Most recent update: 07/03/2023  4:07 AM  Weight  103.7 kg (228 lb 9.9 oz)            Gen: Elderly Caucasian M chronically ill in appearance HEENT: moist mucous membranes CV: Regular rate and rhythm  PULM:  On RA, breathing is even and nonlabored ABD: soft/nontender  EXT: No edema  Neuro: Alert and oriented to self only  SUMMARY OF RECOMMENDATIONS   DNAR/DNI    Allow time for outcomes   Goals are for clinical improvement  Generalized weakness - PT/OT pending    Appreciate TOC speaking to patients daughter  [facility] asked to speak to family about  hospice services  The Palliative medicine will continue to follow along intermittently  Time Spent: 27 Billing based on MDM: High ______________________________________________________________________________________ [name] [facility] Palliative Medicine Team Team Cell Phone: [phone] Please utilize secure chat with additional questions, if there is no response within 30 minutes please call the above phone number  Palliative Medicine Team providers are available by phone from 7am to 7pm daily and can be reached through the team cell phone.  Should this patient require assistance outside of these hours, please call the patient's attending physician.

## 2023-07-03 NOTE — Progress Notes (Signed)
Pt found in bed with feces all over hands, gown, and face.  Pt cleansed and gown, pads changed.  Mitts placed on hands.  Bed alarm on.

## 2023-07-03 NOTE — Evaluation (Signed)
Physical Therapy Evaluation Patient Details Name: Calvin Escobar MRN: [id number] DOB: May 31, 1951 Today's Date: 07/03/2023  History of Present Illness  Pt is 73 year old presented to [facility] on  07/01/23 for AMS and fever. Pt with sepsis due to UTI. PMH - recurrent UTI's, CKD, CHF, anemia, sepsis, dementia, HTN  Clinical Impression  Pt admitted with above diagnosis and presents to PT with functional limitations due to deficits listed below (See PT problem list). Pt needs skilled PT to maximize independence and safety. Pt moving fairly well and able to walk several hundred feet in the hallway. Expect he will progress back to baseline with mobility. Cognition likely the limiting factor for safety at home.           If plan is discharge home, recommend the following: A little help with walking and/or transfers;A little help with bathing/dressing/bathroom;Assistance with cooking/housework;Direct supervision/assist for medications management;Assist for transportation;Help with stairs or ramp for entrance   Can travel by private vehicle        Equipment Recommendations None recommended by PT  Recommendations for Other Services       Functional Status Assessment Patient has had a recent decline in their functional status and demonstrates the ability to make significant improvements in function in a reasonable and predictable amount of time.     Precautions / Restrictions Precautions Precautions: Fall      Mobility  Bed Mobility Overal bed mobility: Needs Assistance Bed Mobility: Supine to Sit     Supine to sit: Supervision, HOB elevated          Transfers Overall transfer level: Needs assistance Equipment used: Rolling walker (2 wheels), None Transfers: Sit to/from Stand Sit to Stand: Contact guard assist           General transfer comment: Assist for safety    Ambulation/Gait Ambulation/Gait assistance: Contact guard assist Gait Distance (Feet): 300 Feet Assistive  device: Rolling walker (2 wheels), None Gait Pattern/deviations: Step-through pattern, Decreased stride length Gait velocity: decr Gait velocity interpretation: 1.31 - 2.62 ft/sec, indicative of limited community ambulator   General Gait Details: Assist for safety and lines  Stairs            Wheelchair Mobility     Tilt Bed    Modified Rankin (Stroke Patients Only)       Balance Overall balance assessment: Needs assistance Sitting-balance support: No upper extremity supported, Feet supported Sitting balance-Leahy Scale: Good     Standing balance support: No upper extremity supported, During functional activity Standing balance-Leahy Scale: Fair                               Pertinent Vitals/Pain Pain Assessment Pain Assessment: No/denies pain    Home Living Family/patient expects to be discharged to:: Private residence Living Arrangements: Spouse/significant other Available Help at Discharge: Family;Available PRN/intermittently Type of Home: House Home Access: Stairs to enter Entrance Stairs-Rails: None Entrance Stairs-Number of Steps: 6-8 Alternate Level Stairs-Number of Steps: flight Home Layout: Two level;Bed/bath upstairs Home Equipment: Agricultural consultant (2 wheels) Additional Comments: Information from prior encounter    Prior Function Prior Level of Function : Independent/Modified Independent;Patient poor historian/Family not available             Mobility Comments: Amb without assistive device in the house       Extremity/Trunk Assessment   Upper Extremity Assessment Upper Extremity Assessment: Defer to OT evaluation    Lower Extremity Assessment  Lower Extremity Assessment: Generalized weakness       Communication   Communication Communication: No apparent difficulties  Cognition Arousal: Alert Behavior During Therapy: WFL for tasks assessed/performed Overall Cognitive Status: No family/caregiver present to determine  baseline cognitive functioning                                 General Comments: History of cognitive deficits. Unsure if this is baseline. Oriented to person, place. Follows commands.        General Comments      Exercises     Assessment/Plan    PT Assessment Patient needs continued PT services  PT Problem List Decreased strength;Decreased balance;Decreased mobility;Decreased cognition;Decreased safety awareness       PT Treatment Interventions DME instruction;Gait training;Stair training;Functional mobility training;Therapeutic activities;Therapeutic exercise;Balance training;Patient/family education    PT Goals (Current goals can be found in the Care Plan section)  Acute Rehab PT Goals Patient Stated Goal: to go home PT Goal Formulation: With patient Time For Goal Achievement: 07/17/23 Potential to Achieve Goals: Good    Frequency Min 1X/week     Co-evaluation               AM-PAC PT "6 Clicks" Mobility  Outcome Measure Help needed turning from your back to your side while in a flat bed without using bedrails?: None Help needed moving from lying on your back to sitting on the side of a flat bed without using bedrails?: A Little Help needed moving to and from a bed to a chair (including a wheelchair)?: A Little Help needed standing up from a chair using your arms (e.g., wheelchair or bedside chair)?: A Little Help needed to walk in hospital room?: A Little Help needed climbing 3-5 steps with a railing? : A Little 6 Click Score: 19    End of Session Equipment Utilized During Treatment: Gait belt Activity Tolerance: Patient tolerated treatment well Patient left: in chair;with call bell/phone within reach;with chair alarm set Nurse Communication: Mobility status PT Visit Diagnosis: Other abnormalities of gait and mobility (R26.89);Muscle weakness (generalized) (M62.81)    Time: 1020-1055 PT Time Calculation (min) (ACUTE ONLY): 35  min   Charges:   PT Evaluation $PT Eval Moderate Complexity: 1 Mod PT Treatments $Gait Training: 8-22 mins PT General Charges $$ ACUTE PT VISIT: 1 Visit         [facility] PT Acute Rehabilitation Services Office [phone]   Angelina Ok [facility] 07/03/2023, 1:52 PM

## 2023-07-03 NOTE — Progress Notes (Signed)
PROGRESS NOTE  Calvin Escobar ZOX:[id number] DOB: [birth year]   PCP: [name], MD  Patient is from: Home.  Lives with his wife and son.  Reports ambulating independently at baseline.  DOA: 07/01/2023 LOS: 2  Chief complaints Chief Complaint  Patient presents with   Code Sepsis     Brief Narrative / Interim history: 73 year old M with PMH of HFimpEF, OSA on CPAP, dementia, anemia, DM, HLD, tremors, bipolar disorder BPH/urine retention with indwelling Foley catheter and recurrent hospitalization presenting with fever and confusion.  Hospitalized 12/8-12/2 for E. coli bacteremia/emphysematous cystitis and confusion for which she was treated with antibiotics and discharged Hospitalized again 06/02/2023-06/11/2023 for severe sepsis from Pseudomonas UTI with concern for colovesical fistula treated with IV antibiotics and urology and surgery recommending conservative management with outpatient follow-up.  Reportedly had cystoscopy afterward that did not suggest colovesical fistula.  Saw PCP 5 days prior to presentation due to penile pain and had Foley catheter removed.  Presents to ED on 1/27 with fever and confusion.  Had mild temp to 100 and tachycardia and tachypnea.  WBC 11.8 with left shift.  UA suggests UTI.  Is also an AKI. He was started on IV fluid and antibiotics.  Blood cultures NGTD.  Urine culture with Pseudomonas aeruginosa.  Hospital course complicated by delirium.  Palliative medicine consulted and following.  Subjective: Seen and examined earlier this morning.  Patient had delirium, pulled out IV lines and found in bed with feces all over his arms and gown on face last night.  This morning, he complains penile pain from Foley catheter.  He is awake and alert and oriented to self, place, month but not date and year.  He denies chest pain, shortness of breath or abdominal pain.  Objective: Vitals:   07/03/23 0406 07/03/23 0419 07/03/23 0528 07/03/23 0857  BP:    125/77 120/64  Pulse:   (!) 105 98  Resp:   18 18  Temp:   99.2 F (37.3 C) 98.4 F (36.9 C)  TempSrc:   Oral Oral  SpO2:  98% 96% 93%  Weight: 103.7 kg     Height:        Examination:  GENERAL: No apparent distress.  Nontoxic. HEENT: MMM.  Vision and hearing grossly intact.  NECK: Supple.  No apparent JVD.  RESP:  No IWOB.  Fair aeration bilaterally. CVS:  RRR. Heart sounds normal.  ABD/GI/GU: BS+. Abd soft, NTND.  Indwelling Foley catheter.  Light red urine in bag.  No clots. MSK/EXT:  Moves extremities. No apparent deformity. No edema.  SKIN: no apparent skin lesion or wound NEURO: Awake, alert and oriented x 4 except date and year.  No apparent focal neuro deficit. PSYCH: Calm. Normal affect.   Procedures:  None  Microbiology summarized: COVID-19, influenza and RSV PCR nonreactive Blood cultures NGTD Urine culture with Pseudomonas aeruginosa  Assessment and plan: Severe sepsis due to complicated UTI: POA.  Had leukocytosis, tachycardia, tachypnea, AKI and mental status change on presentation.  Patient with BPH and urinary tension and chronic Foley that was removed about 5 days prior to admission.  UA consistent with UTI.  Urine culture with Pseudomonas aeruginosa.  Recently treated for Pseudomonas aeruginosa.  Recurrent admission. -Continue IV Zosyn pending urine culture sensitivity -Foley catheter replaced in ED.  Chronic BPH with history of chronic indwelling Foley catheter recently removed by PCP 5 days prior to presentation.  Foley catheter replaced in ED. -Continue Foley catheter -Continue Flomax   AKI: Baseline Cr  0.9-1.1.  History of hypotension.  Also on ibuprofen at home.  Recent Labs    06/04/23 0925 06/05/23 0503 06/06/23 0310 06/07/23 0305 06/08/23 0434 06/10/23 0358 06/24/23 1349 07/01/23 1953 07/02/23 0018 07/02/23 0222 07/03/23 0621  BUN 15 15 17 19  18 15 13 14 16   --  15 14  CREATININE 0.94 0.98 1.33* 0.96  1.11 0.95 1.13 1.23 1.49*  1.52* 1.45* 1.30*  -Continue monitoring -Avoid nephrotoxic meds and hypotension  Colovesical fistula: Diagnosed previous hospitalization.  At that time, urology and general surgery recommended conservative management.  Colorectal surgery suggested bilateral PCNT if conservative measures fail.  Reportedly had cystoscopy recently that did not show evidence of colovesical fistula.  -Continue Foley catheter for now  Chronic HFimpEF: TTE in 03/2023 with LVEF of 35 to 40%.  LVEF improved to 50 to 55% on repeat TTE in 05/2023.  Cardiopulmonary symptoms.  Does not seem to be on diuretics. -Monitor fluid and respiratory status -Outpatient follow-up with cardiology -GDMT held recent hospitalization due to hypotension.  Anemia of chronic disease: H&H relatively stable.  No overt bleeding other than light pink urine.  Recent Labs    06/06/23 2214 06/07/23 0305 06/09/23 1525 06/10/23 0358 06/11/23 0350 06/24/23 1349 07/01/23 1953 07/02/23 0018 07/02/23 0222 07/03/23 0621  HGB 8.0* 8.3* 9.0* 8.4* 8.5* 9.6* 8.8* 8.4* 8.1* 7.7*  -Continue monitoring  Dementia with behavioral disturbance: Delirious overnight.  Removed IV lines and had feces all over. -Patent attorney -Delirium precaution -Minimize avoid sedating medications -IV Haldol as needed. -Treat UTI and other treatable causes -Fall precaution  Advance care planning: Overall, guarded prognosis.  DNR/DNI which is appropriate. -Palliative medicine following.  Anxiety/bipolar disorder -Continue home regimen with BuSpar, Abilify, Depakote, trazodone  Oropharyngeal dysphagia -Dysphagia 3 diet per SLP.   Hypotension: Resolved -Continue midodrine   Leukocytosis: Resolved   Hyponatremia: Resolved.   Hyperlipidemia -Continue statin   Recurrent fall -Fall precautions.   -PT/OT eval  Hypokalemia -Monitor replenish as appropriate   Chronic tremors -Continue primidone and gabapentin   OSA -Continue CPAP if he  tolerates.   Obesity Body mass index is 30.16 kg/m.           DVT prophylaxis:  enoxaparin (LOVENOX) injection 40 mg Start: 07/02/23 0800 SCDs Start: 07/01/23 2258  Code Status: DNR/DNI Family Communication: None at bedside Level of care: Med-Surg Status is: Inpatient Remains inpatient appropriate because: Severe sepsis, delirium/encephalopathy, AKI   Final disposition: SNF Consultants:  Palliative medicine  55 minutes with more than 50% spent in reviewing records, counseling patient/family and coordinating care.   Sch Meds:  Scheduled Meds:  ARIPiprazole  2 mg Oral Daily   busPIRone  10 mg Oral TID   divalproex  500 mg Oral QHS   enoxaparin (LOVENOX) injection  40 mg Subcutaneous Q24H   gabapentin  300 mg Oral QHS   midodrine  5 mg Oral TID WC   pantoprazole (PROTONIX) IV  40 mg Intravenous Q24H   potassium chloride  60 mEq Oral Once   pravastatin  20 mg Oral q1800   primidone  50 mg Oral BID   sodium chloride flush  3 mL Intravenous Q12H   tamsulosin  0.4 mg Oral QPC supper   traZODone  300 mg Oral QHS   venlafaxine XR  300 mg Oral Q breakfast   Continuous Infusions:  piperacillin-tazobactam (ZOSYN)  IV 3.375 g (07/03/23 0508)   PRN Meds:.acetaminophen **OR** acetaminophen, albuterol, haloperidol lactate, polyethylene glycol  Antimicrobials: Anti-infectives (From admission, onward)  Start     Dose/Rate Route Frequency Ordered Stop   07/01/23 2300  cefTRIAXone (ROCEPHIN) 1 g in sodium chloride 0.9 % 100 mL IVPB  Status:  Discontinued        1 g 200 mL/hr over 30 Minutes Intravenous Daily at 10 pm 07/01/23 2242 07/02/23 1100   07/01/23 2300  piperacillin-tazobactam (ZOSYN) IVPB 3.375 g        3.375 g 12.5 mL/hr over 240 Minutes Intravenous Every 8 hours 07/01/23 2258     07/01/23 1945  ceFEPIme (MAXIPIME) 2 g in sodium chloride 0.9 % 100 mL IVPB        2 g 200 mL/hr over 30 Minutes Intravenous  Once 07/01/23 1939 07/01/23 2058   07/01/23 1945   metroNIDAZOLE (FLAGYL) IVPB 500 mg        500 mg 100 mL/hr over 60 Minutes Intravenous  Once 07/01/23 1939 07/01/23 2103   07/01/23 1945  vancomycin (VANCOCIN) IVPB 1000 mg/200 mL premix        1,000 mg 200 mL/hr over 60 Minutes Intravenous  Once 07/01/23 1939 07/01/23 2208        I have personally reviewed the following labs and images: CBC: Recent Labs  Lab 07/01/23 1953 07/02/23 0018 07/02/23 0222 07/03/23 0621  WBC 11.8* 11.6* 10.9* 8.7  NEUTROABS 8.9*  --   --  5.7  HGB 8.8* 8.4* 8.1* 7.7*  HCT 28.9* 27.4* 27.1* 24.9*  MCV 89.8 88.4 89.4 87.4  PLT 181 178 156 149*   BMP &GFR Recent Labs  Lab 07/01/23 1953 07/02/23 0018 07/02/23 0222 07/03/23 0621  NA 133*  --  131* 136  K 4.5  --  4.0 3.4*  CL 99  --  99 100  CO2 23  --  22 24  GLUCOSE 134*  --  116* 102*  BUN 16  --  15 14  CREATININE 1.49* 1.52* 1.45* 1.30*  CALCIUM 8.7*  --  8.3* 8.4*  MG  --   --   --  1.9   Estimated Creatinine Clearance: 63 mL/min (A) (by C-G formula based on SCr of 1.3 mg/dL (H)). Liver & Pancreas: Recent Labs  Lab 07/01/23 1953 07/03/23 0621  AST 17 17  ALT 9 10  ALKPHOS 49 41  BILITOT 0.7 0.6  PROT 7.4 6.3*  ALBUMIN 3.0* 2.4*   No results for input(s): "LIPASE", "AMYLASE" in the last 168 hours. No results for input(s): "AMMONIA" in the last 168 hours. Diabetic: No results for input(s): "HGBA1C" in the last 72 hours. No results for input(s): "GLUCAP" in the last 168 hours. Cardiac Enzymes: No results for input(s): "CKTOTAL", "CKMB", "CKMBINDEX", "TROPONINI" in the last 168 hours. No results for input(s): "PROBNP" in the last 8760 hours. Coagulation Profile: Recent Labs  Lab 07/01/23 1953 07/02/23 0222  INR 1.2 1.2   Thyroid Function Tests: No results for input(s): "TSH", "T4TOTAL", "FREET4", "T3FREE", "THYROIDAB" in the last 72 hours. Lipid Profile: No results for input(s): "CHOL", "HDL", "LDLCALC", "TRIG", "CHOLHDL", "LDLDIRECT" in the last 72 hours. Anemia  Panel: No results for input(s): "VITAMINB12", "FOLATE", "FERRITIN", "TIBC", "IRON", "RETICCTPCT" in the last 72 hours. Urine analysis:    Component Value Date/Time   COLORURINE YELLOW 07/01/2023 2057   APPEARANCEUR CLOUDY (A) 07/01/2023 2057   LABSPEC 1.009 07/01/2023 2057   PHURINE 5.0 07/01/2023 2057   GLUCOSEU NEGATIVE 07/01/2023 2057   HGBUR SMALL (A) 07/01/2023 2057   BILIRUBINUR NEGATIVE 07/01/2023 2057   KETONESUR NEGATIVE 07/01/2023 2057   PROTEINUR 100 (  A) 07/01/2023 2057   UROBILINOGEN 0.2 09/12/2013 1446   NITRITE POSITIVE (A) 07/01/2023 2057   LEUKOCYTESUR MODERATE (A) 07/01/2023 2057   Sepsis Labs: Invalid input(s): "PROCALCITONIN", "LACTICIDVEN"  Microbiology: Recent Results (from the past 240 hours)  Blood Culture (routine x 2)     Status: None (Preliminary result)   Collection Time: 07/01/23  7:39 PM   Specimen: BLOOD RIGHT WRIST  Result Value Ref Range Status   Specimen Description BLOOD RIGHT WRIST  Final   Special Requests   Final    BOTTLES DRAWN AEROBIC AND ANAEROBIC Blood Culture results may not be optimal due to an inadequate volume of blood received in culture bottles   Culture   Final    NO GROWTH 2 DAYS Performed at [facility], [address] [place], [place] [address]    Report Status PENDING  Incomplete  Resp panel by RT-PCR (RSV, Flu A&B, Covid) Anterior Nasal Swab     Status: None   Collection Time: 07/01/23  7:47 PM   Specimen: Anterior Nasal Swab  Result Value Ref Range Status   SARS Coronavirus 2 by RT PCR NEGATIVE NEGATIVE Final   Influenza A by PCR NEGATIVE NEGATIVE Final   Influenza B by PCR NEGATIVE NEGATIVE Final    Comment: (NOTE) The Xpert Xpress SARS-CoV-2/FLU/RSV plus assay is intended as an aid in the diagnosis of influenza from Nasopharyngeal swab specimens and should not be used as a sole basis for treatment. Nasal washings and aspirates are unacceptable for Xpert Xpress SARS-CoV-2/FLU/RSV testing.  Fact Sheet for  Patients: BloggerCourse.com  Fact Sheet for Healthcare Providers: SeriousBroker.it  This test is not yet approved or cleared by the [place] FDA and has been authorized for detection and/or diagnosis of SARS-CoV-2 by FDA under an Emergency Use Authorization (EUA). This EUA will remain in effect (meaning this test can be used) for the duration of the COVID-19 declaration under Section 564(b)(1) of the Act, 21 U.S.C. section 360bbb-3(b)(1), unless the authorization is terminated or revoked.     Resp Syncytial Virus by PCR NEGATIVE NEGATIVE Final    Comment: (NOTE) Fact Sheet for Patients: BloggerCourse.com  Fact Sheet for Healthcare Providers: SeriousBroker.it  This test is not yet approved or cleared by the [place] FDA and has been authorized for detection and/or diagnosis of SARS-CoV-2 by FDA under an Emergency Use Authorization (EUA). This EUA will remain in effect (meaning this test can be used) for the duration of the COVID-19 declaration under Section 564(b)(1) of the Act, 21 U.S.C. section 360bbb-3(b)(1), unless the authorization is terminated or revoked.  Performed at [facility], [address], [place], [place] [address]   Blood Culture (routine x 2)     Status: None (Preliminary result)   Collection Time: 07/01/23  7:47 PM   Specimen: BLOOD  Result Value Ref Range Status   Specimen Description BLOOD RIGHT ANTECUBITAL  Final   Special Requests   Final    BOTTLES DRAWN AEROBIC AND ANAEROBIC Blood Culture results may not be optimal due to an inadequate volume of blood received in culture bottles   Culture   Final    NO GROWTH 2 DAYS Performed at [facility], [address], [place], [place] [address]    Report Status PENDING  Incomplete  Urine Culture     Status: Abnormal (Preliminary result)   Collection Time: 07/01/23  8:57 PM   Specimen:  Urine, Clean Catch  Result Value Ref Range Status  Specimen Description URINE, CLEAN CATCH  Final   Special Requests NONE Reflexed from [id number]  Final   Culture (A)  Final    >=100,000 COLONIES/mL PSEUDOMONAS AERUGINOSA SUSCEPTIBILITIES TO FOLLOW Performed at [facility], [address] [place], [place] [address]    Report Status PENDING  Incomplete    Radiology Studies: CT Head Wo Contrast Result Date: 07/03/2023 CLINICAL DATA:  Head trauma, minor (Age >= 65y) EXAM: CT HEAD WITHOUT CONTRAST TECHNIQUE: Contiguous axial images were obtained from the base of the skull through the vertex without intravenous contrast. RADIATION DOSE REDUCTION: This exam was performed according to the departmental dose-optimization program which includes automated exposure control, adjustment of the mA and/or kV according to patient size and/or use of iterative reconstruction technique. COMPARISON:  CT head 06/02/2023. FINDINGS: Brain: No evidence of acute infarction, hemorrhage, hydrocephalus, extra-axial collection or mass lesion/mass effect. Similar chronic microvascular ischemic change and cerebral atrophy. Vascular: No hyperdense vessel.  Calcific atherosclerosis. Skull: No acute fracture. Sinuses/Orbits: Mostly clear sinuses.  No acute orbital findings. Other: No mastoid effusions. IMPRESSION: Stable head CT.  No evidence of acute intracranial abnormality. Electronically Signed   By: Feliberto Harts M.D.   On: 07/03/2023 00:53      Moise Friday T. Eryx [facility]  If 7PM-7AM, please contact night-coverage [email or web address] 07/03/2023, 11:50 AM

## 2023-07-04 DIAGNOSIS — Z515 Encounter for palliative care: Secondary | ICD-10-CM | POA: Diagnosis not present

## 2023-07-04 DIAGNOSIS — Z7189 Other specified counseling: Secondary | ICD-10-CM | POA: Diagnosis not present

## 2023-07-04 LAB — RENAL FUNCTION PANEL
Albumin: 2.3 g/dL — ABNORMAL LOW (ref 3.5–5.0)
Anion gap: 10 (ref 5–15)
BUN: 15 mg/dL (ref 8–23)
CO2: 24 mmol/L (ref 22–32)
Calcium: 8.4 mg/dL — ABNORMAL LOW (ref 8.9–10.3)
Chloride: 105 mmol/L (ref 98–111)
Creatinine, Ser: 1.31 mg/dL — ABNORMAL HIGH (ref 0.61–1.24)
GFR, Estimated: 57 mL/min — ABNORMAL LOW (ref >60)
Glucose, Bld: 84 mg/dL (ref 70–99)
Phosphorus: 3.9 mg/dL (ref 2.5–4.6)
Potassium: 3.8 mmol/L (ref 3.5–5.1)
Sodium: 139 mmol/L (ref 135–145)

## 2023-07-04 LAB — CBC
HCT: 23.9 % — ABNORMAL LOW (ref 39.0–52.0)
Hemoglobin: 7.2 g/dL — ABNORMAL LOW (ref 13.0–17.0)
MCH: 26.8 pg (ref 26.0–34.0)
MCHC: 30.1 g/dL (ref 30.0–36.0)
MCV: 88.8 fL (ref 80.0–100.0)
Platelets: 155 10*3/uL (ref 150–400)
RBC: 2.69 MIL/uL — ABNORMAL LOW (ref 4.22–5.81)
RDW: 16.5 % — ABNORMAL HIGH (ref 11.5–15.5)
WBC: 5.5 10*3/uL (ref 4.0–10.5)
nRBC: 0 % (ref 0.0–0.2)

## 2023-07-04 LAB — URINE CULTURE

## 2023-07-04 LAB — MAGNESIUM: Magnesium: 2.1 mg/dL (ref 1.7–2.4)

## 2023-07-04 LAB — PREPARE RBC (CROSSMATCH)

## 2023-07-04 MED ORDER — SODIUM CHLORIDE 0.9% IV SOLUTION: Freq: Once | INTRAVENOUS | Status: AC

## 2023-07-04 MED ORDER — LEVOFLOXACIN 500 MG PO TABS
500.0000 mg | ORAL_TABLET | Freq: Every day | ORAL | Status: DC
  Administered 2023-07-04 – 2023-07-05 (×2): 500 mg via ORAL
  Filled 2023-07-04 (×2): qty 1

## 2023-07-04 MED ORDER — PANTOPRAZOLE SODIUM 40 MG PO TBEC
40.0000 mg | DELAYED_RELEASE_TABLET | Freq: Every day | ORAL | Status: DC
  Administered 2023-07-04: 40 mg via ORAL
  Filled 2023-07-04: qty 1

## 2023-07-04 NOTE — Progress Notes (Signed)
Attempted to contact patients wife again for phone consent for blood transfusion, no answer. MD made aware that nursing has been unable to contact family for consent for blood transfusion. Charge RN made aware along with AC. AC instructed nursing to pass along for night shift RN to continue to attempting to contact family for phone consent.

## 2023-07-04 NOTE — TOC Progression Note (Signed)
Transition of Care [facility]) - Progression Note    Patient Details  Name: Calvin Escobar MRN: [id number] Date of Birth: 01/26/1951  Transition of Care [facility]) CM/SW Contact  Janae Bridgeman, RN Phone Number: 07/04/2023, 1:27 PM  Clinical Narrative:    CM called and spoke with the patient's wife by phone to discuss TOC needs.  The patient admitted to the hospital with septicemia and plans to return home when medically stable for discharge.  The patient's wife was offered Medicare choice regarding Outpatient palliative and did not have a preference but was agreeable for OUtpatient palliative Care services through [facility].  Glenna Fellows, CM with [facility] is aware.  Patient's wife was offered Medicare choice regarding home health services and she states that patient was recently active with [facility] and she would like their services re-started.  HH orders placed to be co-signed by MD.  [facility] PT/OT/MSw ordered.  Patient has a RW at home but does not use.  Wife is aware that it was recommended by therapy.  No other TOC needs and patient should return home with family when medically stable for discharge by car.        Expected Discharge Plan and Services                                               Social Determinants of Health (SDOH) Interventions SDOH Screenings   Food Insecurity: No Food Insecurity (07/03/2023)  Housing: Low Risk  (07/03/2023)  Transportation Needs: No Transportation Needs (07/03/2023)  Utilities: Not At Risk (07/03/2023)  Depression (PHQ2-9): Low Risk  (03/07/2023)  Recent Concern: Depression (PHQ2-9) - High Risk (01/15/2023)  Financial Resource Strain: Low Risk  (10/03/2022)  Physical Activity: Inactive (05/04/2022)  Social Connections: Moderately Isolated (07/03/2023)  Stress: Stress Concern Present (11/30/2022)  Tobacco Use: Low Risk  (07/03/2023)    Readmission Risk Interventions    07/04/2023    1:27 PM 07/03/2023    2:48 PM  06/05/2023   10:12 AM  Readmission Risk Prevention Plan  Transportation Screening Complete Complete Complete  PCP or Specialist Appt within 5-7 Days Complete Complete   PCP or Specialist Appt within 3-5 Days   Complete  Home Care Screening Complete Complete   Medication Review (RN CM) Complete Complete   HRI or Home Care Consult   Complete  Social Work Consult for Recovery Care Planning/Counseling   Complete  Palliative Care Screening   Complete  Medication Review Oceanographer)   Complete

## 2023-07-04 NOTE — Progress Notes (Signed)
[facility] Liaison Note  Received request to discuss hospice services at home after discharge. Spoke with spouse to initiate education related to hospice philosophy, services, and team approach to care as well as OP Palliative care services. Spouse verbalized understanding of information given.   At this time, Calvin Escobar is not agreeable to hospice philosophy and care. She would like for our outpatient palliative care services to follow after discharge.   [facility] information and contact numbers given to Spouse. Above information shared with Marcelino Duster, Transitions of Care Manager. Please call with any questions or concerns.   Thank you for the opportunity to participate in this patient's care.   Glenna Fellows BSN, Charity fundraiser, OCN [employer] [phone]

## 2023-07-04 NOTE — Progress Notes (Signed)
Mobility Specialist: Progress Note   07/04/23 1557  Mobility  Activity Ambulated with assistance in hallway  Level of Assistance Contact guard assist, steadying assist  Assistive Device Front wheel walker  Distance Ambulated (ft) 100 ft  Activity Response Tolerated well  Mobility Referral Yes  Mobility visit 1 Mobility  Mobility Specialist Start Time (ACUTE ONLY) 1327  Mobility Specialist Stop Time (ACUTE ONLY) 1345  Mobility Specialist Time Calculation (min) (ACUTE ONLY) 18 min    Pt was agreeable to mobility session - received in chair. Cg throughout. No complaints. Returned to room without fault. Left in chair with all needs met, call bell in reach. Chair restraint on.   Maurene Capes Mobility Specialist Please contact via SecureChat or Rehab office at [phone]

## 2023-07-04 NOTE — Progress Notes (Signed)
Called patients wife to get phone consent for blood transfusion, no answer. Left voicemail with instructions for her to call back. Will call again later.

## 2023-07-04 NOTE — Care Management Important Message (Signed)
Important Message  Patient Details  Name: Calvin Escobar MRN: [id number] Date of Birth: [birth year]   Important Message Given:  Yes - Medicare IM     Dorena Bodo 07/04/2023, 2:48 PM

## 2023-07-04 NOTE — Progress Notes (Signed)
Attempted to contact patient's wife regarding consent for blood transfusion. No answer. Message left to return call

## 2023-07-04 NOTE — Progress Notes (Signed)
Attempted to call patients wife again to obtain phone consent for blood transfusion, no answer, left voicemail.

## 2023-07-04 NOTE — Evaluation (Signed)
Occupational Therapy Evaluation Patient Details Name: Calvin Escobar MRN: [id number] DOB: 31-Dec-1950 Today's Date: 07/04/2023   History of Present Illness Pt is 73 year old presented to [facility] on  07/01/23 for AMS and fever. Pt with sepsis due to UTI. PMH - recurrent UTI's, CKD, CHF, anemia, sepsis, dementia, HTN   Clinical Impression   Pt at this time presented in bed and wanting to get OOB. He was able to complete ADLS with supervision to min guard for UE and CGA to min assist for due to safety and sequencing through tasks.  Pt reported he does not have a walker at home or a shower seat but due to recall unsure if this is clear. As recommendation would be to have these devices with the return to home with [facility] services.       If plan is discharge home, recommend the following: A little help with walking and/or transfers;A little help with bathing/dressing/bathroom;Assistance with cooking/housework;Assistance with feeding;Direct supervision/assist for medications management;Direct supervision/assist for financial management;Assist for transportation;Help with stairs or ramp for entrance;Supervision due to cognitive status    Functional Status Assessment  Patient has had a recent decline in their functional status and demonstrates the ability to make significant improvements in function in a reasonable and predictable amount of time.  Equipment Recommendations  Tub/shower seat (FW if they do not have one at home)    Recommendations for Other Services       Precautions / Restrictions Precautions Precautions: Fall Restrictions Weight Bearing Restrictions Per Provider Order: No      Mobility Bed Mobility Overal bed mobility: Needs Assistance Bed Mobility: Supine to Sit     Supine to sit: Supervision, HOB elevated Sit to supine: Supervision        Transfers Overall transfer level: Needs assistance Equipment used: Rolling walker (2 wheels) Transfers: Sit to/from Stand Sit to  Stand: Contact guard assist           General transfer comment: Assist for safety      Balance Overall balance assessment: Needs assistance Sitting-balance support: No upper extremity supported, Feet supported Sitting balance-Leahy Scale: Good     Standing balance support: No upper extremity supported, Bilateral upper extremity supported Standing balance-Leahy Scale: Fair Standing balance comment: with RW support                           ADL either performed or assessed with clinical judgement   ADL Overall ADL's : Needs assistance/impaired Eating/Feeding: Sitting;Set up   Grooming: Wash/dry hands;Wash/dry face;Contact guard assist;Standing   Upper Body Bathing: Contact guard assist;Sitting   Lower Body Bathing: Minimal assistance;Sit to/from stand;Sitting/lateral leans   Upper Body Dressing : Contact guard assist;Sitting;Cueing for safety;Cueing for sequencing   Lower Body Dressing: Minimal assistance;Cueing for safety;Cueing for sequencing;Sit to/from stand   Toilet Transfer: Contact guard assist;Cueing for safety;Cueing for sequencing;Rolling walker (2 wheels)   Toileting- Clothing Manipulation and Hygiene: Contact guard assist;Sit to/from stand       Functional mobility during ADLs: Contact guard assist;Rolling walker (2 wheels);Cueing for sequencing;Cueing for safety       Vision Baseline Vision/History: 1 Wears glasses Ability to See in Adequate Light: 0 Adequate Vision Assessment?: Wears glasses for driving     Perception Perception: Within Functional Limits       Praxis Praxis: WFL       Pertinent Vitals/Pain Pain Assessment Pain Assessment: Faces Faces Pain Scale: Hurts a little bit Breathing: normal Negative Vocalization:  none Facial Expression: smiling or inexpressive Body Language: relaxed Consolability: no need to console PAINAD Score: 0 Facial Expression: Tense Body Movements: Absence of movements Muscle Tension:  Relaxed Compliance with ventilator (intubated pts.): N/A Vocalization (extubated pts.): N/A CPOT Total: 1 Pain Location: Pt reported when attempting to urinate had pain Pain Descriptors / Indicators: Discomfort, Grimacing Pain Intervention(s): Limited activity within patient's tolerance, Repositioned     Extremity/Trunk Assessment Upper Extremity Assessment Upper Extremity Assessment: Generalized weakness   Lower Extremity Assessment Lower Extremity Assessment: Defer to PT evaluation   Cervical / Trunk Assessment Cervical / Trunk Assessment: Kyphotic   Communication Communication Communication: No apparent difficulties   Cognition Arousal: Alert Behavior During Therapy: WFL for tasks assessed/performed Overall Cognitive Status: No family/caregiver present to determine baseline cognitive functioning                                 General Comments: History of cognitive deficits. Unsure if this is baseline. Oriented to person, place. Follows commands. Pt could not determine why they were and within the session could not recall once told thema and at one point reported that they thought they were in their home.     General Comments       Exercises     Shoulder Instructions      Home Living Family/patient expects to be discharged to:: Private residence Living Arrangements: Spouse/significant other Available Help at Discharge: Family;Available PRN/intermittently Type of Home: House Home Access: Stairs to enter [employer] of Steps: 6-8 Entrance Stairs-Rails: None Home Layout: Two level;Bed/bath upstairs Alternate Level Stairs-Number of Steps: flight Alternate Level Stairs-Rails: Right;Left Bathroom Shower/Tub: Producer, television/film/video: Standard Bathroom Accessibility: Yes   Home Equipment: None   Additional Comments: Pt information was from chart but pt also reported as well. However the only thing different pt reported no walker at  home.      Prior Functioning/Environment Prior Level of Function : Independent/Modified Independent;Patient poor historian/Family not available             Mobility Comments: Amb without assistive device in the house          OT Problem List: Decreased activity tolerance;Impaired balance (sitting and/or standing);Decreased safety awareness;Decreased knowledge of use of DME or AE;Cardiopulmonary status limiting activity;Pain      OT Treatment/Interventions: Self-care/ADL training;DME and/or AE instruction;Therapeutic activities;Patient/family education;Balance training    OT Goals(Current goals can be found in the care plan section) Acute Rehab OT Goals Patient Stated Goal: to go home OT Goal Formulation: With patient Time For Goal Achievement: 07/18/23 Potential to Achieve Goals: Fair ADL Goals Pt Will Perform Upper Body Bathing: with modified independence;sitting Pt Will Perform Lower Body Bathing: with supervision;sit to/from stand Pt Will Transfer to Toilet: with supervision;ambulating Pt Will Perform Tub/Shower Transfer: with contact guard assist;ambulating  OT Frequency: Min 1X/week    Co-evaluation              AM-PAC OT "6 Clicks" Daily Activity     Outcome Measure Help from another person eating meals?: None Help from another person taking care of personal grooming?: A Little Help from another person toileting, which includes using toliet, bedpan, or urinal?: A Little Help from another person bathing (including washing, rinsing, drying)?: A Little Help from another person to put on and taking off regular upper body clothing?: A Little Help from another person to put on and taking off regular lower body  clothing?: A Little 6 Click Score: 19   End of Session Equipment Utilized During Treatment: Gait belt;Rolling walker (2 wheels) Nurse Communication: Mobility status  Activity Tolerance: Patient tolerated treatment well Patient left: in chair;with call  bell/phone within reach;with chair alarm set  OT Visit Diagnosis: Unsteadiness on feet (R26.81);Other abnormalities of gait and mobility (R26.89);Repeated falls (R29.6);Muscle weakness (generalized) (M62.81);Pain Pain - Right/Left:  (when urination)                Time: 1610-9604 OT Time Calculation (min): 35 min Charges:  OT General Charges $OT Visit: 1 Visit OT Evaluation $OT Eval Low Complexity: 1 Low OT Treatments $Self Care/Home Management : 8-22 mins  Presley Raddle OTR/L  Acute Rehab Services  [phone] office number   Alphia Moh 07/04/2023, 8:29 AM

## 2023-07-04 NOTE — Progress Notes (Addendum)
PROGRESS NOTE  Isiaih Hollenbach WUJ:[id number] DOB: 16-Feb-1951   PCP: [name], MD  Patient is from: Home.  Lives with his wife and son.  Reports ambulating independently at baseline.  DOA: 07/01/2023 LOS: 3  Chief complaints Chief Complaint  Patient presents with   Code Sepsis     Brief Narrative / Interim history: 73 year old M with PMH of HFimpEF, OSA on CPAP, dementia, anemia, DM, HLD, tremors, bipolar disorder BPH/urine retention with indwelling Foley catheter and recurrent hospitalization presenting with fever and confusion.  Hospitalized 12/8-12/2 for E. coli bacteremia/emphysematous cystitis and confusion for which she was treated with antibiotics and discharged Hospitalized again 06/02/2023-06/11/2023 for severe sepsis from Pseudomonas UTI with concern for colovesical fistula treated with IV antibiotics and urology and surgery recommending conservative management with outpatient follow-up.  Reportedly had cystoscopy afterward that did not suggest colovesical fistula.  Saw PCP 5 days prior to presentation due to penile pain and had Foley catheter removed.  Presents to ED on 1/27 with fever and confusion.  Had mild temp to 100 and tachycardia and tachypnea.  WBC 11.8 with left shift.  UA suggests UTI.  Is also an AKI. He was started on IV fluid and antibiotics.  Blood cultures NGTD.  Urine culture with Pseudomonas aeruginosa resistant to Zosyn (per pharmacy).  Antibiotics changed to p.o. Levaquin on 1/30.  Hospital course complicated by delirium and anemia.  Palliative medicine consulted and following.  Plan for discharge home with home health and palliative on 1/31.  Subjective: Seen and examined earlier this morning.  Seen and examined earlier this morning.  No major events overnight of this morning.  No complaints.  He said he worked with therapy this morning.  He likes to walk more.  No complaints.  Objective: Vitals:   07/03/23 2019 07/04/23 0451 07/04/23 0500  07/04/23 0838  BP: 123/82 125/69  134/83  Pulse: 83 77  80  Resp: 18 18  16   Temp: 98.2 F (36.8 C) (!) 97.4 F (36.3 C)  (!) 97.4 F (36.3 C)  TempSrc: Oral Oral    SpO2: 95% 98%  100%  Weight:   104.4 kg   Height:        Examination:  GENERAL: No apparent distress.  Nontoxic.  Sitting on bedside chair. HEENT: MMM.  Vision and hearing grossly intact.  NECK: Supple.  No apparent JVD.  RESP:  No IWOB.  Fair aeration bilaterally. CVS:  RRR. Heart sounds normal.  ABD/GI/GU: BS+. Abd soft, NTND.  Indwelling Foley catheter.  Light red urine in bag.  No clots. MSK/EXT:  Moves extremities. No apparent deformity. No edema.  SKIN: no apparent skin lesion or wound NEURO: Awake, alert and oriented x 4 except date and year.  No apparent focal neuro deficit. PSYCH: Calm. Normal affect.   Procedures:  None  Microbiology summarized: COVID-19, influenza and RSV PCR nonreactive Blood cultures NGTD Urine culture with Pseudomonas aeruginosa  Assessment and plan: Severe sepsis due to complicated UTI: POA.  Had leukocytosis, tachycardia, tachypnea, AKI and mental status change on presentation.  Patient with BPH and urinary tension and chronic Foley that was removed about 5 days prior to admission.  UA consistent with UTI.  Urine culture with Pseudomonas aeruginosa.  Recently treated for Pseudomonas aeruginosa.  Recurrent admission. -Antibiotics changed to p.o. Levaquin on 1/30.  Per pharmacy, Pseudomonas resistant to IV Zosyn. -Foley catheter replaced in ED.  Chronic BPH with history of chronic indwelling Foley catheter recently removed by PCP 5 days prior  to presentation.  Foley catheter replaced in ED. -Continue Foley catheter -Continue Flomax   AKI: Baseline Cr 0.9-1.1.  History of hypotension.  Also on ibuprofen at home.  CKD ruled out. Recent Labs    06/05/23 0503 06/06/23 0310 06/07/23 0305 06/08/23 0434 06/10/23 0358 06/24/23 1349 07/01/23 1953 07/02/23 0018 07/02/23 0222  07/03/23 0621 07/04/23 0633  BUN 15 17 19  18 15 13 14 16   --  15 14 15   CREATININE 0.98 1.33* 0.96  1.11 0.95 1.13 1.23 1.49* 1.52* 1.45* 1.30* 1.31*  -Continue monitoring -Avoid nephrotoxic meds and hypotension -Avoid NSAID.  History of colovesical fistula: Diagnosed previous hospitalization.  At that time, urology and general surgery recommended conservative management.  Colorectal surgery suggested bilateral PCNT if conservative measures fail.  Reportedly had cystoscopy recently that did not show evidence of colovesical fistula.  -Continue Foley catheter for now -Outpatient follow-up with urology and colorectal surgery as previously planned  Chronic HFimpEF: TTE in 03/2023 with LVEF of 35 to 40%.  LVEF improved to 50 to 55% on repeat TTE in 05/2023.  Cardiopulmonary symptoms.  Does not seem to be on diuretics. -Monitor fluid and respiratory status -Outpatient follow-up with cardiology -GDMT held recent hospitalization due to hypotension.  Anemia of chronic disease/hematuria: Hgb dropped about 1 g.  Light pink urine in Foley bag. Recent Labs    06/07/23 0305 06/09/23 1525 06/10/23 0358 06/11/23 0350 06/24/23 1349 07/01/23 1953 07/02/23 0018 07/02/23 0222 07/03/23 0621 07/04/23 0633  HGB 8.3* 9.0* 8.4* 8.5* 9.6* 8.8* 8.4* 8.1* 7.7* 7.2*  -Discontinue subcu Lovenox -SCD for VTE prophylaxis -Transfuse 1 unit.  Patient's wife verbally consented for transfusion.  Dementia with behavioral disturbance: Delirious overnight.  Removed IV lines and had feces all over. -Patent attorney -Delirium precaution -Minimize avoid sedating medications -IV Haldol as needed. -Treat UTI and other treatable causes -Fall precaution  Advance care planning: Overall, guarded prognosis.  DNR/DNI which is appropriate. -Palliative medicine following.  Anxiety/bipolar disorder -Continue home regimen with BuSpar, Abilify, Depakote, trazodone  Oropharyngeal dysphagia -Dysphagia 3 diet per  SLP.   Hypotension: Resolved -Continue midodrine   Leukocytosis: Resolved   Hyponatremia: Resolved.   Hyperlipidemia -Continue statin   Recurrent fall -Fall precautions.   -PT/OT eval  Hypokalemia -Monitor replenish as appropriate   Chronic tremors -Continue primidone and gabapentin   OSA -Continue CPAP if he tolerates.   Obesity Body mass index is 30.37 kg/m.           DVT prophylaxis:  Place and maintain sequential compression device Start: 07/04/23 1048 SCDs Start: 07/01/23 2258  Code Status: DNR/DNI Family Communication: Updated patient's wife over the phone. Level of care: Med-Surg Status is: Inpatient Remains inpatient appropriate because: Severe sepsis, delirium/encephalopathy, AKI   Final disposition: Home with home health and palliative on 1/31 Consultants:  Palliative medicine  55 minutes with more than 50% spent in reviewing records, counseling patient/family and coordinating care.   Sch Meds:  Scheduled Meds:  sodium chloride   Intravenous Once   ARIPiprazole  2 mg Oral Daily   busPIRone  10 mg Oral TID   divalproex  500 mg Oral QHS   gabapentin  300 mg Oral QHS   levofloxacin  500 mg Oral Daily   midodrine  5 mg Oral TID WC   pantoprazole (PROTONIX) IV  40 mg Intravenous Q24H   pravastatin  20 mg Oral q1800   primidone  50 mg Oral BID   sodium chloride flush  3 mL Intravenous Q12H  tamsulosin  0.4 mg Oral QPC supper   traZODone  300 mg Oral QHS   venlafaxine XR  300 mg Oral Q breakfast   Continuous Infusions:   PRN Meds:.acetaminophen **OR** acetaminophen, albuterol, haloperidol lactate, polyethylene glycol  Antimicrobials: Anti-infectives (From admission, onward)    Start     Dose/Rate Route Frequency Ordered Stop   07/04/23 1100  levofloxacin (LEVAQUIN) tablet 500 mg        500 mg Oral Daily 07/04/23 1010     07/01/23 2300  cefTRIAXone (ROCEPHIN) 1 g in sodium chloride 0.9 % 100 mL IVPB  Status:  Discontinued        1  g 200 mL/hr over 30 Minutes Intravenous Daily at 10 pm 07/01/23 2242 07/02/23 1100   07/01/23 2300  piperacillin-tazobactam (ZOSYN) IVPB 3.375 g  Status:  Discontinued        3.375 g 12.5 mL/hr over 240 Minutes Intravenous Every 8 hours 07/01/23 2258 07/04/23 1010   07/01/23 1945  ceFEPIme (MAXIPIME) 2 g in sodium chloride 0.9 % 100 mL IVPB        2 g 200 mL/hr over 30 Minutes Intravenous  Once 07/01/23 1939 07/01/23 2058   07/01/23 1945  metroNIDAZOLE (FLAGYL) IVPB 500 mg        500 mg 100 mL/hr over 60 Minutes Intravenous  Once 07/01/23 1939 07/01/23 2103   07/01/23 1945  vancomycin (VANCOCIN) IVPB 1000 mg/200 mL premix        1,000 mg 200 mL/hr over 60 Minutes Intravenous  Once 07/01/23 1939 07/01/23 2208        I have personally reviewed the following labs and images: CBC: Recent Labs  Lab 07/01/23 1953 07/02/23 0018 07/02/23 0222 07/03/23 0621 07/04/23 0633  WBC 11.8* 11.6* 10.9* 8.7 5.5  NEUTROABS 8.9*  --   --  5.7  --   HGB 8.8* 8.4* 8.1* 7.7* 7.2*  HCT 28.9* 27.4* 27.1* 24.9* 23.9*  MCV 89.8 88.4 89.4 87.4 88.8  PLT 181 178 156 149* 155   BMP &GFR Recent Labs  Lab 07/01/23 1953 07/02/23 0018 07/02/23 0222 07/03/23 0621 07/04/23 0633  NA 133*  --  131* 136 139  K 4.5  --  4.0 3.4* 3.8  CL 99  --  99 100 105  CO2 23  --  22 24 24   GLUCOSE 134*  --  116* 102* 84  BUN 16  --  15 14 15   CREATININE 1.49* 1.52* 1.45* 1.30* 1.31*  CALCIUM 8.7*  --  8.3* 8.4* 8.4*  MG  --   --   --  1.9 2.1  PHOS  --   --   --   --  3.9   Estimated Creatinine Clearance: 62.8 mL/min (A) (by C-G formula based on SCr of 1.31 mg/dL (H)). Liver & Pancreas: Recent Labs  Lab 07/01/23 1953 07/03/23 0621 07/04/23 0633  AST 17 17  --   ALT 9 10  --   ALKPHOS 49 41  --   BILITOT 0.7 0.6  --   PROT 7.4 6.3*  --   ALBUMIN 3.0* 2.4* 2.3*   No results for input(s): "LIPASE", "AMYLASE" in the last 168 hours. No results for input(s): "AMMONIA" in the last 168 hours. Diabetic: No  results for input(s): "HGBA1C" in the last 72 hours. No results for input(s): "GLUCAP" in the last 168 hours. Cardiac Enzymes: No results for input(s): "CKTOTAL", "CKMB", "CKMBINDEX", "TROPONINI" in the last 168 hours. No results for input(s): "PROBNP" in the  last 8760 hours. Coagulation Profile: Recent Labs  Lab 07/01/23 1953 07/02/23 0222  INR 1.2 1.2   Thyroid Function Tests: No results for input(s): "TSH", "T4TOTAL", "FREET4", "T3FREE", "THYROIDAB" in the last 72 hours. Lipid Profile: No results for input(s): "CHOL", "HDL", "LDLCALC", "TRIG", "CHOLHDL", "LDLDIRECT" in the last 72 hours. Anemia Panel: No results for input(s): "VITAMINB12", "FOLATE", "FERRITIN", "TIBC", "IRON", "RETICCTPCT" in the last 72 hours. Urine analysis:    Component Value Date/Time   COLORURINE YELLOW 07/01/2023 2057   APPEARANCEUR CLOUDY (A) 07/01/2023 2057   LABSPEC 1.009 07/01/2023 2057   PHURINE 5.0 07/01/2023 2057   GLUCOSEU NEGATIVE 07/01/2023 2057   HGBUR SMALL (A) 07/01/2023 2057   BILIRUBINUR NEGATIVE 07/01/2023 2057   KETONESUR NEGATIVE 07/01/2023 2057   PROTEINUR 100 (A) 07/01/2023 2057   UROBILINOGEN 0.2 09/12/2013 1446   NITRITE POSITIVE (A) 07/01/2023 2057   LEUKOCYTESUR MODERATE (A) 07/01/2023 2057   Sepsis Labs: Invalid input(s): "PROCALCITONIN", "LACTICIDVEN"  Microbiology: Recent Results (from the past 240 hours)  Blood Culture (routine x 2)     Status: None (Preliminary result)   Collection Time: 07/01/23  7:39 PM   Specimen: BLOOD RIGHT WRIST  Result Value Ref Range Status   Specimen Description BLOOD RIGHT WRIST  Final   Special Requests   Final    BOTTLES DRAWN AEROBIC AND ANAEROBIC Blood Culture results may not be optimal due to an inadequate volume of blood received in culture bottles   Culture   Final    NO GROWTH 3 DAYS Performed at [facility], [address], [place], [place] [address]    Report Status PENDING  Incomplete  Resp panel by RT-PCR (RSV, Flu  A&B, Covid) Anterior Nasal Swab     Status: None   Collection Time: 07/01/23  7:47 PM   Specimen: Anterior Nasal Swab  Result Value Ref Range Status   SARS Coronavirus 2 by RT PCR NEGATIVE NEGATIVE Final   Influenza A by PCR NEGATIVE NEGATIVE Final   Influenza B by PCR NEGATIVE NEGATIVE Final    Comment: (NOTE) The Xpert Xpress SARS-CoV-2/FLU/RSV plus assay is intended as an aid in the diagnosis of influenza from Nasopharyngeal swab specimens and should not be used as a sole basis for treatment. Nasal washings and aspirates are unacceptable for Xpert Xpress SARS-CoV-2/FLU/RSV testing.  Fact Sheet for Patients: BloggerCourse.com  Fact Sheet for Healthcare Providers: SeriousBroker.it  This test is not yet approved or cleared by the [place] FDA and has been authorized for detection and/or diagnosis of SARS-CoV-2 by FDA under an Emergency Use Authorization (EUA). This EUA will remain in effect (meaning this test can be used) for the duration of the COVID-19 declaration under Section 564(b)(1) of the Act, 21 U.S.C. section 360bbb-3(b)(1), unless the authorization is terminated or revoked.     Resp Syncytial Virus by PCR NEGATIVE NEGATIVE Final    Comment: (NOTE) Fact Sheet for Patients: BloggerCourse.com  Fact Sheet for Healthcare Providers: SeriousBroker.it  This test is not yet approved or cleared by the [place] FDA and has been authorized for detection and/or diagnosis of SARS-CoV-2 by FDA under an Emergency Use Authorization (EUA). This EUA will remain in effect (meaning this test can be used) for the duration of the COVID-19 declaration under Section 564(b)(1) of the Act, 21 U.S.C. section 360bbb-3(b)(1), unless the authorization is terminated or revoked.  Performed at [facility], [address] [place], [place] [address]   Blood Culture (routine x 2)  Status: None (Preliminary result)   Collection Time: 07/01/23  7:47 PM   Specimen: BLOOD  Result Value Ref Range Status   Specimen Description BLOOD RIGHT ANTECUBITAL  Final   Special Requests   Final    BOTTLES DRAWN AEROBIC AND ANAEROBIC Blood Culture results may not be optimal due to an inadequate volume of blood received in culture bottles   Culture   Final    NO GROWTH 3 DAYS Performed at [facility], [address], [place], [place] [address]    Report Status PENDING  Incomplete  Urine Culture     Status: Abnormal   Collection Time: 07/01/23  8:57 PM   Specimen: Urine, Clean Catch  Result Value Ref Range Status   Specimen Description URINE, CLEAN CATCH  Final   Special Requests   Final    NONE Reflexed from [id number] Performed at [facility], [address], [place], [place] [address]    Culture >=100,000 COLONIES/mL PSEUDOMONAS AERUGINOSA (A)  Final   Report Status 07/04/2023 FINAL  Final   Organism ID, Bacteria PSEUDOMONAS AERUGINOSA (A)  Final      Susceptibility   Pseudomonas aeruginosa - MIC*    CEFTAZIDIME 4 SENSITIVE Sensitive     CIPROFLOXACIN <=0.25 SENSITIVE Sensitive     GENTAMICIN 2 SENSITIVE Sensitive     IMIPENEM 1 SENSITIVE Sensitive     * >=100,000 COLONIES/mL PSEUDOMONAS AERUGINOSA    Radiology Studies: No results found.     Tyrihanna Wingert T. Chemeka Filice [facility]  If 7PM-7AM, please contact night-coverage [email or web address] 07/04/2023, 1:33 PM

## 2023-07-04 NOTE — Progress Notes (Signed)
Mobility Specialist: Progress Note   07/04/23 1123  Mobility  Activity Ambulated with assistance in hallway  Level of Assistance Contact guard assist, steadying assist  Assistive Device Front wheel walker  Distance Ambulated (ft) 300 ft  Activity Response Tolerated well  Mobility Referral Yes  Mobility visit 1 Mobility  Mobility Specialist Start Time (ACUTE ONLY) 0841  Mobility Specialist Stop Time (ACUTE ONLY) 0901  Mobility Specialist Time Calculation (min) (ACUTE ONLY) 20 min    Pt was eager for mobility session - received in chair. CG throughout. No complaints. Used the RW for steadiness even though he does not usually use it at home. Returned to room without fault. Left in chair with all needs met, call bell in reach. Chair restraint on.   Maurene Capes Mobility Specialist Please contact via SecureChat or Rehab office at [phone]

## 2023-07-05 ENCOUNTER — Other Ambulatory Visit (HOSPITAL_COMMUNITY): Payer: Self-pay

## 2023-07-05 DIAGNOSIS — I420 Dilated cardiomyopathy: Secondary | ICD-10-CM

## 2023-07-05 DIAGNOSIS — F3131 Bipolar disorder, current episode depressed, mild: Secondary | ICD-10-CM

## 2023-07-05 DIAGNOSIS — A419 Sepsis, unspecified organism: Secondary | ICD-10-CM | POA: Diagnosis not present

## 2023-07-05 DIAGNOSIS — N179 Acute kidney failure, unspecified: Secondary | ICD-10-CM | POA: Diagnosis not present

## 2023-07-05 DIAGNOSIS — G4733 Obstructive sleep apnea (adult) (pediatric): Secondary | ICD-10-CM

## 2023-07-05 DIAGNOSIS — N401 Enlarged prostate with lower urinary tract symptoms: Secondary | ICD-10-CM

## 2023-07-05 DIAGNOSIS — N3001 Acute cystitis with hematuria: Secondary | ICD-10-CM

## 2023-07-05 DIAGNOSIS — N138 Other obstructive and reflux uropathy: Secondary | ICD-10-CM

## 2023-07-05 DIAGNOSIS — N321 Vesicointestinal fistula: Secondary | ICD-10-CM

## 2023-07-05 DIAGNOSIS — R6520 Severe sepsis without septic shock: Secondary | ICD-10-CM

## 2023-07-05 DIAGNOSIS — J9601 Acute respiratory failure with hypoxia: Secondary | ICD-10-CM

## 2023-07-05 LAB — RENAL FUNCTION PANEL
Albumin: 2.7 g/dL — ABNORMAL LOW (ref 3.5–5.0)
Anion gap: 12 (ref 5–15)
BUN: 14 mg/dL (ref 8–23)
CO2: 24 mmol/L (ref 22–32)
Calcium: 8.8 mg/dL — ABNORMAL LOW (ref 8.9–10.3)
Chloride: 101 mmol/L (ref 98–111)
Creatinine, Ser: 1.2 mg/dL (ref 0.61–1.24)
GFR, Estimated: >60 mL/min (ref >60)
Glucose, Bld: 90 mg/dL (ref 70–99)
Phosphorus: 3.9 mg/dL (ref 2.5–4.6)
Potassium: 4.2 mmol/L (ref 3.5–5.1)
Sodium: 137 mmol/L (ref 135–145)

## 2023-07-05 LAB — CBC
HCT: 29.8 % — ABNORMAL LOW (ref 39.0–52.0)
Hemoglobin: 9.4 g/dL — ABNORMAL LOW (ref 13.0–17.0)
MCH: 27.5 pg (ref 26.0–34.0)
MCHC: 31.5 g/dL (ref 30.0–36.0)
MCV: 87.1 fL (ref 80.0–100.0)
Platelets: 233 10*3/uL (ref 150–400)
RBC: 3.42 MIL/uL — ABNORMAL LOW (ref 4.22–5.81)
RDW: 16.1 % — ABNORMAL HIGH (ref 11.5–15.5)
WBC: 7 10*3/uL (ref 4.0–10.5)
nRBC: 0 % (ref 0.0–0.2)

## 2023-07-05 LAB — MAGNESIUM: Magnesium: 2.2 mg/dL (ref 1.7–2.4)

## 2023-07-05 MED ORDER — LEVOFLOXACIN 500 MG PO TABS
500.0000 mg | ORAL_TABLET | Freq: Every day | ORAL | 0 refills | Status: AC
  Filled 2023-07-05: qty 4, 4d supply, fill #0

## 2023-07-05 MED ORDER — PANTOPRAZOLE SODIUM 40 MG PO TBEC
40.0000 mg | DELAYED_RELEASE_TABLET | Freq: Every day | ORAL | 1 refills | Status: DC
  Filled 2023-07-05: qty 30, 30d supply, fill #0

## 2023-07-05 MED ORDER — ORAL CARE MOUTH RINSE: 15.0000 mL | OROMUCOSAL | Status: DC | PRN

## 2023-07-05 NOTE — Progress Notes (Signed)
Return call received from patient's spouse. Verbal permission for blood consent obtained with 2nd verifier charge nurse. Consent placed in chart and blood to be administered per orders

## 2023-07-05 NOTE — Progress Notes (Signed)
Pt's Nurse Shawna Orleans spoke with pt's wife over the phone, she was notified of pt's discharge home order. She was informed pt will go to the discharge lounge while he waits for her arrival, wife states she is at work and will be here between 3:30-4:30pm to pick him up.   She was informed that a nurse would review his instructions with her when she arrives.  Pt has TOC meds and they will be asked to be delivered to the discharge lounge.   Jahlani Lorentz,RN SWOT

## 2023-07-05 NOTE — Progress Notes (Signed)
Attempted to contact patient's wife again regarding consent for blood. No answer. Message left on voice mail to return call.

## 2023-07-05 NOTE — Discharge Summary (Signed)
Physician Discharge Summary  Calvin Escobar WUJ:[id number] DOB: 1950/09/05 DOA: 07/01/2023  PCP: [name], MD  Admit date: 07/01/2023 Discharge date: 07/05/2023 Admitted From: Home Disposition: Home Recommendations for Outpatient Follow-up:  Outpatient follow-up with PCP and palliative as below Patient follow-up with urology and colorectal surgery as previously planned Check CBC and CMP in 1 week Please follow up on the following pending results: None  Home Health: [facility] PT/OT Equipment/Devices: None  Discharge Condition: Stable CODE STATUS: DNR/DNI  Follow-up Information     Care, [facility] Follow up.   Specialty: Home Health Services Why: [facility] will provide services for PT/OT and medical social work.  They will call you in the next 24-48 hours to set up services. Contact information: [address] [place] [address] [phone]         [facility] Follow up.   Specialty: PALLIATIVE CARE Why: [facility] will provide Outpatient Palliative Care Services. Contact information: [address] [place] [address] [phone]        [name], MD. Schedule an appointment as soon as possible for a visit in 1 week(s).   Specialty: Internal Medicine Contact information: [address] [place] [address] [phone]                 Hospital course 73 year old M with PMH of HFimpEF, OSA on CPAP, dementia, anemia, DM, HLD, tremors, bipolar disorder BPH/urine retention with indwelling Foley catheter and recurrent hospitalization presenting with fever and confusion.   Hospitalized 12/8-12/2 for E. coli bacteremia/emphysematous cystitis and confusion for which she was treated with antibiotics and discharged Hospitalized again 06/02/2023-06/11/2023 for severe sepsis from Pseudomonas UTI with concern for colovesical fistula treated with IV antibiotics and urology and surgery  recommending conservative management with outpatient follow-up.  Reportedly had cystoscopy afterward that did not suggest colovesical fistula.   Saw PCP 5 days prior to presentation due to penile pain and had Foley catheter removed.   Presents to ED on 1/27 with fever and confusion.  Had mild temp to 100 and tachycardia and tachypnea.  WBC 11.8 with left shift.  UA suggests UTI.  Is also an AKI.  Foley catheter inserted in ED.  He was started on IV fluid and antibiotics.  Blood cultures NGTD.  Urine culture with Pseudomonas aeruginosa resistant to Zosyn (per pharmacy).  Antibiotics changed to p.o. Levaquin on 1/30.  He is discharged on p.o. Levaquin for 4 more days from 07/05/2022.  Outpatient follow-up with urology as previously planned.   Hospital course complicated by delirium and anemia.  Anemia improved after 1 units of blood transfusion.  Delirium improved.  Palliative medicine consulted and recommended outpatient follow-up.   See individual problem list below for more.   Problems addressed during this hospitalization Severe sepsis due to complicated UTI: POA.  Had leukocytosis, tachycardia, tachypnea, AKI and mental status change on presentation.  Patient with BPH and urinary tension and chronic Foley that was removed about 5 days prior to admission.  UA consistent with UTI.  Urine culture with Pseudomonas aeruginosa.  Recently treated for Pseudomonas aeruginosa.  Recurrent admission. -Antibiotics changed to p.o. Levaquin on 1/30 through 2/4.  Per pharmacy, Pseudomonas resistant to IV Zosyn. -Foley catheter replaced in ED. -Outpatient follow-up with urology as previously planned   Chronic BPH with history of chronic indwelling Foley catheter recently removed by PCP 5 days prior to presentation.  Foley catheter replaced in ED. -Continue Foley catheter -Continue Flomax  AKI: Baseline Cr 0.9-1.1.  History of hypotension.  Also on ibuprofen at home.  CKD ruled out.  AKI resolved. Recent  Labs    06/06/23 0310 06/07/23 0305 06/08/23 0434 06/10/23 0358 06/24/23 1349 07/01/23 1953 07/02/23 0018 07/02/23 0222 07/03/23 0621 07/04/23 0633 07/05/23 1058  BUN 17 19  18 15 13 14 16   --  15 14 15 14   CREATININE 1.33* 0.96  1.11 0.95 1.13 1.23 1.49* 1.52* 1.45* 1.30* 1.31* 1.20  -Encourage oral hydration -Advised to avoid NSAID    History of colovesical fistula: Diagnosed previous hospitalization.  At that time, urology and general surgery recommended conservative management.  Colorectal surgery suggested bilateral PCNT if conservative measures fail.  Reportedly had cystoscopy recently that did not show evidence of colovesical fistula.  -Continue Foley catheter  -Outpatient follow-up with urology and colorectal surgery as previously planned   Chronic HFimpEF: TTE in 03/2023 with LVEF of 35 to 40%.  LVEF improved to 50 to 55% on repeat TTE in 05/2023.  Cardiopulmonary symptoms.  Does not seem to be on diuretics. -Monitor fluid and respiratory status -Outpatient follow-up with cardiology -GDMT held recent hospitalization due to hypotension.   Anemia of chronic disease/hematuria: Hgb dropped about 1 g.  Light pink urine in Foley bag but improved after stopping Lovenox.  Hgb improved to 9.4 after 1 unit.Marland Kitchen Recent Labs    06/09/23 1525 06/10/23 0358 06/11/23 0350 06/24/23 1349 07/01/23 1953 07/02/23 0018 07/02/23 0222 07/03/23 0621 07/04/23 0633 07/05/23 1058  HGB 9.0* 8.4* 8.5* 9.6* 8.8* 8.4* 8.1* 7.7* 7.2* 9.4*  -Recheck seen PC at follow-up  Dementia with behavioral disturbance: Improved.  He is awake and alert and oriented x 4 except time.  -Treat treatable causes -Reorientation and delirium precaution. -Discontinued Remeron.   Advance care planning: Overall, guarded prognosis.  DNR/DNI which is appropriate. -Palliative medicine following.   Anxiety/bipolar disorder -Continue home regimen with BuSpar, Abilify, Depakote, trazodone   Oropharyngeal  dysphagia -Dysphagia 3 diet per SLP.   Hypotension: Resolved -Continue midodrine   Leukocytosis: Resolved   Hyponatremia: Resolved.   Hyperlipidemia -Continue statin   Recurrent fall -HH PT/OT   Hypokalemia: Resolved.   Chronic tremors -Continue primidone and gabapentin   OSA -Continue CPAP if he tolerates.   Obesity Body mass index is 30.37 kg/m.             Time spent 35 minutes  Vital signs Vitals:   07/05/23 0545 07/05/23 0600 07/05/23 0727 07/05/23 1200  BP: (!) 165/104 Comment: patient moving in bed 118/70 135/81 127/84  Pulse: (!) 103 89 94 88  Temp: 98.4 F (36.9 C) 98.2 F (36.8 C) 98.1 F (36.7 C) 98.4 F (36.9 C)  Resp: 18 18 18 18   Height:      Weight:      SpO2: 95% 94% 94% 97%  TempSrc: Oral Oral Oral   BMI (Calculated):         Discharge exam  GENERAL: No apparent distress.  Nontoxic. HEENT: MMM.  Vision and hearing grossly intact.  NECK: Supple.  No apparent JVD.  RESP:  No IWOB.  Fair aeration bilaterally. CVS:  RRR. Heart sounds normal.  ABD/GI/GU: BS+. Abd soft, NTND.  Foley in place. MSK/EXT:  Moves extremities. No apparent deformity. No edema.  SKIN: no apparent skin lesion or wound NEURO: Awake and alert. Oriented x 4 except time.  Some tremors.  No apparent focal neuro deficit. PSYCH: Calm. Normal affect.   Discharge Instructions Discharge Instructions  Diet - low sodium heart healthy   Complete by: As directed    Dysphagia 3 diet   Discharge instructions   Complete by: As directed    It has been a pleasure taking care of you!  You were hospitalized due to urinary tract infection for which you have been treated with antibiotics.  We are discharging you more antibiotics to complete treatment course.  Please review your new medication list and the directions on your medications before you take them.  Follow-up with your primary care doctor in 1 to 2 weeks or sooner if needed.  Follow-up with urology and colorectal  surgery as previously planned.   Take care,   Increase activity slowly   Complete by: As directed       Allergies as of 07/05/2023       Reactions   Albuterol Palpitations   Supraventricular Tachycardia         Medication List     STOP taking these medications    famotidine 20 MG tablet Commonly known as: PEPCID   ibuprofen 200 MG tablet Commonly known as: ADVIL   mirtazapine 30 MG tablet Commonly known as: REMERON       TAKE these medications    ARIPiprazole 2 MG tablet Commonly known as: ABILIFY Take 2 mg by mouth in the morning.   busPIRone 10 MG tablet Commonly known as: BUSPAR Take 10 mg by mouth 3 (three) times daily.   calcium-vitamin D 500-5 MG-MCG tablet Commonly known as: OSCAL WITH D Take 1 tablet by mouth daily with breakfast.   divalproex 250 MG 24 hr tablet Commonly known as: DEPAKOTE ER Take 500 mg by mouth at bedtime.   folic acid 1 MG tablet Commonly known as: FOLVITE Take 1 tablet (1 mg total) by mouth daily. What changed: when to take this   gabapentin 300 MG capsule Commonly known as: NEURONTIN Take 300 mg by mouth at bedtime.   levofloxacin 500 MG tablet Commonly known as: LEVAQUIN Take 1 tablet (500 mg total) by mouth daily for 4 days. Start taking on: July 06, 2023   midodrine 5 MG tablet Commonly known as: PROAMATINE Take 1 tablet (5 mg total) by mouth 3 (three) times daily with meals.   multivitamin tablet Take 1 tablet by mouth daily.   pantoprazole 40 MG tablet Commonly known as: PROTONIX Take 1 tablet (40 mg total) by mouth at bedtime.   pravastatin 20 MG tablet Commonly known as: PRAVACHOL Take 1 tablet (20 mg total) by mouth at bedtime.   primidone 50 MG tablet Commonly known as: MYSOLINE Take 1 tablet (50 mg total) by mouth 2 (two) times daily.   tamsulosin 0.4 MG Caps capsule Commonly known as: FLOMAX TAKE 1 CAPSULE BY MOUTH EVERY DAY   traZODone 100 MG tablet Commonly known as: DESYREL Take  300 mg by mouth at bedtime.   venlafaxine XR 150 MG 24 hr capsule Commonly known as: EFFEXOR-XR Take 300 mg by mouth daily with breakfast.        Consultations: Palliative medicine  Procedures/Studies:   CT Head Wo Contrast Result Date: 07/03/2023 CLINICAL DATA:  Head trauma, minor (Age >= 65y) EXAM: CT HEAD WITHOUT CONTRAST TECHNIQUE: Contiguous axial images were obtained from the base of the skull through the vertex without intravenous contrast. RADIATION DOSE REDUCTION: This exam was performed according to the departmental dose-optimization program which includes automated exposure control, adjustment of the mA and/or kV according to patient size and/or use of iterative reconstruction technique.  COMPARISON:  CT head 06/02/2023. FINDINGS: Brain: No evidence of acute infarction, hemorrhage, hydrocephalus, extra-axial collection or mass lesion/mass effect. Similar chronic microvascular ischemic change and cerebral atrophy. Vascular: No hyperdense vessel.  Calcific atherosclerosis. Skull: No acute fracture. Sinuses/Orbits: Mostly clear sinuses.  No acute orbital findings. Other: No mastoid effusions. IMPRESSION: Stable head CT.  No evidence of acute intracranial abnormality. Electronically Signed   By: Feliberto Harts M.D.   On: 07/03/2023 00:53   DG Chest Port 1 View Result Date: 07/01/2023 CLINICAL DATA:  Possible sepsis EXAM: PORTABLE CHEST 1 VIEW COMPARISON:  06/07/2023 FINDINGS: Cardiac shadow is stable. Poor inspiratory effort is noted. Calcified granuloma is again noted in the right mid lung. No focal infiltrate or effusion is seen. No bony abnormality is noted. IMPRESSION: Poor inspiratory effort.  No acute abnormality noted. Electronically Signed   By: Alcide Clever M.D.   On: 07/01/2023 20:14   DG CHEST PORT 1 VIEW Result Date: 06/07/2023 CLINICAL DATA:  Shortness of breath. EXAM: PORTABLE CHEST 1 VIEW COMPARISON:  Chest radiographs 06/05/2023, 06/02/2023, 05/12/2023, 01/29/2016; CT  chest 03/16/2008 FINDINGS: Cardiac silhouette and mediastinal contours are within normal limits for AP technique. Unchanged chronic benign calcified granuloma within the lateral right midlung. Moderately decreased lung volumes. Mild bilateral likely chronic interstitial thickening is unchanged from prior. No pleural effusion or pneumothorax. No acute skeletal abnormality. IMPRESSION: Moderately decreased lung volumes. No acute cardiopulmonary process. Electronically Signed   By: Neita Garnet M.D.   On: 06/07/2023 11:01       The results of significant diagnostics from this hospitalization (including imaging, microbiology, ancillary and laboratory) are listed below for reference.     Microbiology: Recent Results (from the past 240 hours)  Blood Culture (routine x 2)     Status: None (Preliminary result)   Collection Time: 07/01/23  7:39 PM   Specimen: BLOOD RIGHT WRIST  Result Value Ref Range Status   Specimen Description BLOOD RIGHT WRIST  Final   Special Requests   Final    BOTTLES DRAWN AEROBIC AND ANAEROBIC Blood Culture results may not be optimal due to an inadequate volume of blood received in culture bottles   Culture   Final    NO GROWTH 4 DAYS Performed at [facility], [address], [place], [place] [address]    Report Status PENDING  Incomplete  Resp panel by RT-PCR (RSV, Flu A&B, Covid) Anterior Nasal Swab     Status: None   Collection Time: 07/01/23  7:47 PM   Specimen: Anterior Nasal Swab  Result Value Ref Range Status   SARS Coronavirus 2 by RT PCR NEGATIVE NEGATIVE Final   Influenza A by PCR NEGATIVE NEGATIVE Final   Influenza B by PCR NEGATIVE NEGATIVE Final    Comment: (NOTE) The Xpert Xpress SARS-CoV-2/FLU/RSV plus assay is intended as an aid in the diagnosis of influenza from Nasopharyngeal swab specimens and should not be used as a sole basis for treatment. Nasal washings and aspirates are unacceptable for Xpert Xpress  SARS-CoV-2/FLU/RSV testing.  Fact Sheet for Patients: BloggerCourse.com  Fact Sheet for Healthcare Providers: SeriousBroker.it  This test is not yet approved or cleared by the [place] FDA and has been authorized for detection and/or diagnosis of SARS-CoV-2 by FDA under an Emergency Use Authorization (EUA). This EUA will remain in effect (meaning this test can be used) for the duration of the COVID-19 declaration under Section 564(b)(1) of the Act, 21 U.S.C. section 360bbb-3(b)(1), unless the authorization is terminated or revoked.  Resp Syncytial Virus by PCR NEGATIVE NEGATIVE Final    Comment: (NOTE) Fact Sheet for Patients: BloggerCourse.com  Fact Sheet for Healthcare Providers: SeriousBroker.it  This test is not yet approved or cleared by the [place] FDA and has been authorized for detection and/or diagnosis of SARS-CoV-2 by FDA under an Emergency Use Authorization (EUA). This EUA will remain in effect (meaning this test can be used) for the duration of the COVID-19 declaration under Section 564(b)(1) of the Act, 21 U.S.C. section 360bbb-3(b)(1), unless the authorization is terminated or revoked.  Performed at [facility], [address] [place], [place] [address]   Blood Culture (routine x 2)     Status: None (Preliminary result)   Collection Time: 07/01/23  7:47 PM   Specimen: BLOOD  Result Value Ref Range Status   Specimen Description BLOOD RIGHT ANTECUBITAL  Final   Special Requests   Final    BOTTLES DRAWN AEROBIC AND ANAEROBIC Blood Culture results may not be optimal due to an inadequate volume of blood received in culture bottles   Culture   Final    NO GROWTH 4 DAYS Performed at [facility], [address], [place], [place] [address]    Report Status PENDING  Incomplete  Urine Culture     Status: Abnormal   Collection Time:  07/01/23  8:57 PM   Specimen: Urine, Clean Catch  Result Value Ref Range Status   Specimen Description URINE, CLEAN CATCH  Final   Special Requests   Final    NONE Reflexed from [id number] Performed at [facility], [address] [place], [place] [address]    Culture >=100,000 COLONIES/mL PSEUDOMONAS AERUGINOSA (A)  Final   Report Status 07/04/2023 FINAL  Final   Organism ID, Bacteria PSEUDOMONAS AERUGINOSA (A)  Final      Susceptibility   Pseudomonas aeruginosa - MIC*    CEFTAZIDIME 4 SENSITIVE Sensitive     CIPROFLOXACIN <=0.25 SENSITIVE Sensitive     GENTAMICIN 2 SENSITIVE Sensitive     IMIPENEM 1 SENSITIVE Sensitive     * >=100,000 COLONIES/mL PSEUDOMONAS AERUGINOSA     Labs:  CBC: Recent Labs  Lab 07/01/23 1953 07/02/23 0018 07/02/23 0222 07/03/23 0621 07/04/23 0633 07/05/23 1058  WBC 11.8* 11.6* 10.9* 8.7 5.5 7.0  NEUTROABS 8.9*  --   --  5.7  --   --   HGB 8.8* 8.4* 8.1* 7.7* 7.2* 9.4*  HCT 28.9* 27.4* 27.1* 24.9* 23.9* 29.8*  MCV 89.8 88.4 89.4 87.4 88.8 87.1  PLT 181 178 156 149* 155 233   BMP &GFR Recent Labs  Lab 07/01/23 1953 07/02/23 0018 07/02/23 0222 07/03/23 0621 07/04/23 0633 07/05/23 1058  NA 133*  --  131* 136 139 137  K 4.5  --  4.0 3.4* 3.8 4.2  CL 99  --  99 100 105 101  CO2 23  --  22 24 24 24   GLUCOSE 134*  --  116* 102* 84 90  BUN 16  --  15 14 15 14   CREATININE 1.49* 1.52* 1.45* 1.30* 1.31* 1.20  CALCIUM 8.7*  --  8.3* 8.4* 8.4* 8.8*  MG  --   --   --  1.9 2.1 2.2  PHOS  --   --   --   --  3.9 3.9   Estimated Creatinine Clearance: 68.5 mL/min (by C-G formula based on SCr of 1.2 mg/dL). Liver & Pancreas: Recent Labs  Lab 07/01/23 1953 07/03/23 9147 07/04/23 8295 07/05/23  1058  AST 17 17  --   --   ALT 9 10  --   --   ALKPHOS 49 41  --   --   BILITOT 0.7 0.6  --   --   PROT 7.4 6.3*  --   --   ALBUMIN 3.0* 2.4* 2.3* 2.7*   No results for input(s): "LIPASE", "AMYLASE" in the last 168 hours. No results for input(s):  "AMMONIA" in the last 168 hours. Diabetic: No results for input(s): "HGBA1C" in the last 72 hours. No results for input(s): "GLUCAP" in the last 168 hours. Cardiac Enzymes: No results for input(s): "CKTOTAL", "CKMB", "CKMBINDEX", "TROPONINI" in the last 168 hours. No results for input(s): "PROBNP" in the last 8760 hours. Coagulation Profile: Recent Labs  Lab 07/01/23 1953 07/02/23 0222  INR 1.2 1.2   Thyroid Function Tests: No results for input(s): "TSH", "T4TOTAL", "FREET4", "T3FREE", "THYROIDAB" in the last 72 hours. Lipid Profile: No results for input(s): "CHOL", "HDL", "LDLCALC", "TRIG", "CHOLHDL", "LDLDIRECT" in the last 72 hours. Anemia Panel: No results for input(s): "VITAMINB12", "FOLATE", "FERRITIN", "TIBC", "IRON", "RETICCTPCT" in the last 72 hours. Urine analysis:    Component Value Date/Time   COLORURINE YELLOW 07/01/2023 2057   APPEARANCEUR CLOUDY (A) 07/01/2023 2057   LABSPEC 1.009 07/01/2023 2057   PHURINE 5.0 07/01/2023 2057   GLUCOSEU NEGATIVE 07/01/2023 2057   HGBUR SMALL (A) 07/01/2023 2057   BILIRUBINUR NEGATIVE 07/01/2023 2057   KETONESUR NEGATIVE 07/01/2023 2057   PROTEINUR 100 (A) 07/01/2023 2057   UROBILINOGEN 0.2 09/12/2013 1446   NITRITE POSITIVE (A) 07/01/2023 2057   LEUKOCYTESUR MODERATE (A) 07/01/2023 2057   Sepsis Labs: Invalid input(s): "PROCALCITONIN", "LACTICIDVEN"   SIGNED:  Almon Hercules, MD  [facility] 07/05/2023, 12:50 PM

## 2023-07-05 NOTE — Progress Notes (Signed)
Physical Therapy Treatment Patient Details Name: Calvin Escobar MRN: [id number] DOB: [birth year] Today's Date: 07/05/2023   History of Present Illness Pt is 73 year old presented to [facility] on  07/01/23 for AMS and fever. Pt with sepsis due to UTI. PMH - recurrent UTI's, CKD, CHF, anemia, sepsis, dementia, HTN    PT Comments  Pt seen for PT tx with pt agreeable. Pt incontinent of smear BM & PT provides assistance for peri hygiene & changing into clean gown. Pt is able to complete STS multiple times from recliner with supervision, cuing to reach back for armrests prior to sitting.  Pt ambulates in hallway with RW & CGA<>Supervision with cuing to ambulate within base of AD vs forward lean & pushing RW in front of him. Pt negotiates 1 step x 8 times with B rails & CGA. Pt appreciative of PT session & eager to mobilize as much as possible.   If plan is discharge home, recommend the following: A little help with walking and/or transfers;A little help with bathing/dressing/bathroom;Assistance with cooking/housework;Direct supervision/assist for medications management;Assist for transportation;Help with stairs or ramp for entrance   Can travel by private vehicle        Equipment Recommendations  Rolling walker (2 wheels)    Recommendations for Other Services       Precautions / Restrictions Precautions Precautions: Fall Precaution Comments: chronic foley Restrictions Weight Bearing Restrictions Per Provider Order: No     Mobility  Bed Mobility               General bed mobility comments: pt received & left sitting in recliner    Transfers Overall transfer level: Needs assistance Equipment used: Rolling walker (2 wheels) Transfers: Sit to/from Stand Sit to Stand: Supervision           General transfer comment: STS from recliner multiple times during session, cuing to reach back for recliner prior to stand>sit    Ambulation/Gait Ambulation/Gait assistance: Contact guard  assist, Supervision Gait Distance (Feet):  (>200 ft) Assistive device: Rolling walker (2 wheels) Gait Pattern/deviations: Decreased step length - right, Decreased step length - left, Decreased stride length Gait velocity: decreased     General Gait Details: cuing to ambulate within base of AD   Stairs Stairs: Yes Stairs assistance: Contact guard assist Stair Management: Two rails, Step to pattern Number of Stairs:  (1 step x 8 times)     Wheelchair Mobility     Tilt Bed    Modified Rankin (Stroke Patients Only)       Balance Overall balance assessment: Needs assistance Sitting-balance support: No upper extremity supported, Feet supported Sitting balance-Leahy Scale: Good     Standing balance support: During functional activity, Bilateral upper extremity supported, Reliant on assistive device for balance Standing balance-Leahy Scale: Fair                              Cognition Arousal: Alert Behavior During Therapy: WFL for tasks assessed/performed Overall Cognitive Status: No family/caregiver present to determine baseline cognitive functioning                                 General Comments: hx of cognitive deficits but pt pleasant, appreciative of PT session, eager to walk. Incontinent of BM but reports this is baseline 2/2 "spasms".        Exercises      General  Comments General comments (skin integrity, edema, etc.): PT provides assistance for peri hygiene & changing into clean gown.      Pertinent Vitals/Pain Pain Assessment Pain Assessment: No/denies pain    Home Living                          Prior Function            PT Goals (current goals can now be found in the care plan section) Acute Rehab PT Goals Patient Stated Goal: to go home PT Goal Formulation: With patient Time For Goal Achievement: 07/17/23 Potential to Achieve Goals: Good Progress towards PT goals: Progressing toward goals     Frequency    Min 1X/week      PT Plan      Co-evaluation              AM-PAC PT "6 Clicks" Mobility   Outcome Measure  Help needed turning from your back to your side while in a flat bed without using bedrails?: None Help needed moving from lying on your back to sitting on the side of a flat bed without using bedrails?: A Little Help needed moving to and from a bed to a chair (including a wheelchair)?: A Little Help needed standing up from a chair using your arms (e.g., wheelchair or bedside chair)?: A Little Help needed to walk in hospital room?: A Little Help needed climbing 3-5 steps with a railing? : A Little 6 Click Score: 19    End of Session   Activity Tolerance: Patient tolerated treatment well Patient left: in chair;with chair alarm set;with call bell/phone within reach (telesitter in room)   PT Visit Diagnosis: Other abnormalities of gait and mobility (R26.89);Muscle weakness (generalized) (M62.81)     Time: 4098-1191 PT Time Calculation (min) (ACUTE ONLY): 24 min  Charges:    $Therapeutic Activity: 23-37 mins PT General Charges $$ ACUTE PT VISIT: 1 Visit                     Aleda Grana, PT, DPT 07/05/23, 11:41 AM   Sandi Mariscal 07/05/2023, 11:39 AM

## 2023-07-05 NOTE — Progress Notes (Signed)
     Brief progress note:   Chart reviewed. Initial palliative consult completed 07/02/23.  Goals are for clinical improvement with recommendation to allow time for outcomes.    Per chart review, [facility] liaison met with patient's wife yesterday 1/30, and she was not agreeable to hospice philosophy and care.   PMT is following for needs and support. Please contact our team at [phone] if there are immediate needs or patient's condition changes.     Sherlean Foot, NP-C Palliative Medicine   Please call Palliative Medicine team phone with any questions [phone]. For individual providers please see AMION.   No charge

## 2023-07-05 NOTE — Progress Notes (Signed)
Mobility Specialist Progress Note:    07/05/23 1219  Mobility  Activity Ambulated with assistance in hallway  Level of Assistance Contact guard assist, steadying assist  Assistive Device Front wheel walker  Distance Ambulated (ft) 350 ft  Activity Response Tolerated well  Mobility Referral Yes  Mobility visit 1 Mobility  Mobility Specialist Start Time (ACUTE ONLY) [id number]  Mobility Specialist Stop Time (ACUTE ONLY) 0954  Mobility Specialist Time Calculation (min) (ACUTE ONLY) 11 min   Received pt in chair having no complaints and agreeable to mobility. Pt was asymptomatic throughout ambulation and returned to room w/o fault. Left in chair w/ call bell in reach and all needs met. Bed alarm on.    Thompson Grayer Mobility Specialist  Please contact vis Secure Chat or  Rehab Office [phone]

## 2023-07-06 LAB — BPAM RBC
Blood Product Expiration Date: 202502242359
ISSUE DATE / TIME: 202501310549
Unit Type and Rh: 5100

## 2023-07-06 LAB — CULTURE, BLOOD (ROUTINE X 2)
Culture: NO GROWTH
Culture: NO GROWTH

## 2023-07-06 LAB — TYPE AND SCREEN
ABO/RH(D): O POS
Antibody Screen: NEGATIVE
Unit division: 0

## 2023-07-08 ENCOUNTER — Telehealth: Payer: Self-pay

## 2023-07-08 DIAGNOSIS — F332 Major depressive disorder, recurrent severe without psychotic features: Secondary | ICD-10-CM | POA: Diagnosis not present

## 2023-07-08 DIAGNOSIS — F1011 Alcohol abuse, in remission: Secondary | ICD-10-CM | POA: Diagnosis not present

## 2023-07-08 DIAGNOSIS — F413 Other mixed anxiety disorders: Secondary | ICD-10-CM | POA: Diagnosis not present

## 2023-07-08 NOTE — Telephone Encounter (Signed)
Copied from CRM [id number]. Topic: General - Other >> Jul 08, 2023 10:52 AM Kathryne Eriksson wrote: Reason for CRM: Palliative Order >> Jul 08, 2023 10:55 AM Kathryne Eriksson wrote: [name] "[facility]" [phone] Palliative order was received and they're going to be following him on it. If you have any further questions, feel free to contact the number above.   Just an FYI..  Message has been sent to provider

## 2023-07-11 ENCOUNTER — Ambulatory Visit: Payer: Self-pay | Admitting: Internal Medicine

## 2023-07-11 NOTE — Telephone Encounter (Signed)
 Scheduled to see Dr. Boston Byers tomorrow on 2/7 for an OV.

## 2023-07-11 NOTE — Telephone Encounter (Signed)
 Copied from CRM [id number]. Topic: Clinical - Red Word Triage >> Jul 11, 2023 12:51 PM Isabell A wrote: Red Word that prompted transfer to Nurse Triage: Spouse on the line stating patient is experiencing a lot of confusion. Spouse currently at work and he is at home.  Chief Complaint: confusion Symptoms: worsening confusion, difficulty staying asleep, does not recognize his surroundings and items in home,  wonders Frequency: x couple of days Pertinent Negatives: Patient denies new medications, no slurred speech Disposition: [] ED /[] Urgent Care (no appt availability in office) / [x] Appointment(In office/virtual)/ []  [facility]/ [] Home Care/ [] Refused Recommended Disposition /[] [facility]/ []  Follow-up with PCP Additional Notes: pt's wife stated pt is alert to name but does not remember if he ate or not: even right after eating.  Also stated pt has not been eating and drinking as much as normally. Appt scheduled for tomorrow @ 3:40: wife agreed to appt. Nurse unable to give any other care advice due to wife at work and needed to abruptly end call.  Reason for Disposition  [1] Longstanding confusion (e.g., dementia, stroke) AND [2] worsening    Scheduled patient appointment 07/11/2022 at 2:40pm  Answer Assessment - Initial Assessment Questions 1. LEVEL OF CONSCIOUSNESS: How is he (she, the patient) acting right now? (e.g., alert-oriented, confused, lethargic, stuporous, comatose)     Alert to name, does not recognize his surroundings and items in home, does not remember if he ate or not 2. ONSET: When did the confusion start?  (minutes, hours, days)     X couple days ago 3. PATTERN Does this come and go, or has it been constant since it started?  Is it present now?     *No Answer* 4. ALCOHOL or DRUGS: Has he been drinking alcohol or taking any drugs?      no 5. NARCOTIC MEDICINES: Has he been receiving any narcotic medications? (e.g., morphine , Vicodin)     *No  Answer* 6. CAUSE: What do you think is causing the confusion?      *No Answer* 7. OTHER SYMPTOMS: Are there any other symptoms? (e.g., difficulty breathing, headache, fever, weakness)     Difficulty staying at night - get up and wonders,  Protocols used: Confusion - Delirium-A-AH

## 2023-07-12 ENCOUNTER — Encounter (HOSPITAL_COMMUNITY): Payer: Self-pay | Admitting: *Deleted

## 2023-07-12 ENCOUNTER — Emergency Department (HOSPITAL_COMMUNITY): Payer: Medicare HMO

## 2023-07-12 ENCOUNTER — Other Ambulatory Visit: Payer: Self-pay

## 2023-07-12 ENCOUNTER — Encounter: Payer: Self-pay | Admitting: Internal Medicine

## 2023-07-12 ENCOUNTER — Ambulatory Visit: Payer: Medicare HMO | Admitting: Internal Medicine

## 2023-07-12 ENCOUNTER — Inpatient Hospital Stay (HOSPITAL_COMMUNITY)
Admission: EM | Admit: 2023-07-12 | Discharge: 2023-07-19 | DRG: 871 | Disposition: A | Discharge status: Skilled Nursing Facility | Payer: Medicare HMO | Attending: Internal Medicine | Admitting: Internal Medicine

## 2023-07-12 ENCOUNTER — Ambulatory Visit (INDEPENDENT_AMBULATORY_CARE_PROVIDER_SITE_OTHER): Payer: Medicare HMO | Admitting: Internal Medicine

## 2023-07-12 VITALS — BP 124/72 | HR 122 | Temp 96.9°F | Ht 73.0 in | Wt 224.0 lb

## 2023-07-12 DIAGNOSIS — F32A Depression, unspecified: Secondary | ICD-10-CM | POA: Diagnosis present

## 2023-07-12 DIAGNOSIS — Z888 Allergy status to other drugs, medicaments and biological substances status: Secondary | ICD-10-CM

## 2023-07-12 DIAGNOSIS — I77810 Thoracic aortic ectasia: Secondary | ICD-10-CM | POA: Diagnosis present

## 2023-07-12 DIAGNOSIS — E871 Hypo-osmolality and hyponatremia: Secondary | ICD-10-CM | POA: Diagnosis present

## 2023-07-12 DIAGNOSIS — N3000 Acute cystitis without hematuria: Secondary | ICD-10-CM | POA: Diagnosis present

## 2023-07-12 DIAGNOSIS — D638 Anemia in other chronic diseases classified elsewhere: Secondary | ICD-10-CM | POA: Diagnosis not present

## 2023-07-12 DIAGNOSIS — N400 Enlarged prostate without lower urinary tract symptoms: Secondary | ICD-10-CM | POA: Diagnosis present

## 2023-07-12 DIAGNOSIS — E861 Hypovolemia: Secondary | ICD-10-CM | POA: Diagnosis present

## 2023-07-12 DIAGNOSIS — K219 Gastro-esophageal reflux disease without esophagitis: Secondary | ICD-10-CM | POA: Diagnosis present

## 2023-07-12 DIAGNOSIS — Z1152 Encounter for screening for COVID-19: Secondary | ICD-10-CM | POA: Diagnosis not present

## 2023-07-12 DIAGNOSIS — A0472 Enterocolitis due to Clostridium difficile, not specified as recurrent: Secondary | ICD-10-CM | POA: Diagnosis present

## 2023-07-12 DIAGNOSIS — N390 Urinary tract infection, site not specified: Secondary | ICD-10-CM | POA: Diagnosis not present

## 2023-07-12 DIAGNOSIS — G9340 Encephalopathy, unspecified: Secondary | ICD-10-CM | POA: Diagnosis not present

## 2023-07-12 DIAGNOSIS — G4733 Obstructive sleep apnea (adult) (pediatric): Secondary | ICD-10-CM | POA: Diagnosis present

## 2023-07-12 DIAGNOSIS — Z781 Physical restraint status: Secondary | ICD-10-CM

## 2023-07-12 DIAGNOSIS — R5381 Other malaise: Secondary | ICD-10-CM | POA: Diagnosis present

## 2023-07-12 DIAGNOSIS — A419 Sepsis, unspecified organism: Secondary | ICD-10-CM

## 2023-07-12 DIAGNOSIS — R509 Fever, unspecified: Secondary | ICD-10-CM | POA: Diagnosis not present

## 2023-07-12 DIAGNOSIS — E860 Dehydration: Secondary | ICD-10-CM | POA: Diagnosis not present

## 2023-07-12 DIAGNOSIS — R270 Ataxia, unspecified: Secondary | ICD-10-CM

## 2023-07-12 DIAGNOSIS — N411 Chronic prostatitis: Secondary | ICD-10-CM | POA: Diagnosis not present

## 2023-07-12 DIAGNOSIS — G9341 Metabolic encephalopathy: Secondary | ICD-10-CM | POA: Diagnosis not present

## 2023-07-12 DIAGNOSIS — N289 Disorder of kidney and ureter, unspecified: Secondary | ICD-10-CM

## 2023-07-12 DIAGNOSIS — R6520 Severe sepsis without septic shock: Secondary | ICD-10-CM | POA: Diagnosis present

## 2023-07-12 DIAGNOSIS — Z85828 Personal history of other malignant neoplasm of skin: Secondary | ICD-10-CM

## 2023-07-12 DIAGNOSIS — E876 Hypokalemia: Secondary | ICD-10-CM | POA: Diagnosis present

## 2023-07-12 DIAGNOSIS — Z7401 Bed confinement status: Secondary | ICD-10-CM | POA: Diagnosis not present

## 2023-07-12 DIAGNOSIS — Z8249 Family history of ischemic heart disease and other diseases of the circulatory system: Secondary | ICD-10-CM

## 2023-07-12 DIAGNOSIS — R8290 Unspecified abnormal findings in urine: Secondary | ICD-10-CM | POA: Diagnosis not present

## 2023-07-12 DIAGNOSIS — N1832 Chronic kidney disease, stage 3b: Secondary | ICD-10-CM | POA: Diagnosis not present

## 2023-07-12 DIAGNOSIS — F0390 Unspecified dementia without behavioral disturbance: Secondary | ICD-10-CM | POA: Diagnosis present

## 2023-07-12 DIAGNOSIS — B952 Enterococcus as the cause of diseases classified elsewhere: Secondary | ICD-10-CM | POA: Diagnosis present

## 2023-07-12 DIAGNOSIS — Z79899 Other long term (current) drug therapy: Secondary | ICD-10-CM

## 2023-07-12 DIAGNOSIS — E872 Acidosis, unspecified: Secondary | ICD-10-CM | POA: Diagnosis present

## 2023-07-12 DIAGNOSIS — N179 Acute kidney failure, unspecified: Secondary | ICD-10-CM | POA: Diagnosis present

## 2023-07-12 DIAGNOSIS — E7800 Pure hypercholesterolemia, unspecified: Secondary | ICD-10-CM | POA: Diagnosis not present

## 2023-07-12 DIAGNOSIS — D509 Iron deficiency anemia, unspecified: Secondary | ICD-10-CM | POA: Diagnosis present

## 2023-07-12 DIAGNOSIS — E785 Hyperlipidemia, unspecified: Secondary | ICD-10-CM | POA: Diagnosis present

## 2023-07-12 DIAGNOSIS — E8729 Other acidosis: Secondary | ICD-10-CM | POA: Diagnosis present

## 2023-07-12 DIAGNOSIS — Z66 Do not resuscitate: Secondary | ICD-10-CM | POA: Diagnosis present

## 2023-07-12 DIAGNOSIS — R4182 Altered mental status, unspecified: Secondary | ICD-10-CM | POA: Diagnosis present

## 2023-07-12 DIAGNOSIS — T839XXD Unspecified complication of genitourinary prosthetic device, implant and graft, subsequent encounter: Secondary | ICD-10-CM | POA: Diagnosis not present

## 2023-07-12 DIAGNOSIS — I420 Dilated cardiomyopathy: Secondary | ICD-10-CM | POA: Diagnosis present

## 2023-07-12 DIAGNOSIS — R531 Weakness: Secondary | ICD-10-CM | POA: Diagnosis not present

## 2023-07-12 HISTORY — DX: Metabolic encephalopathy: G93.41

## 2023-07-12 LAB — RESP PANEL BY RT-PCR (RSV, FLU A&B, COVID)  RVPGX2
Influenza A by PCR: NEGATIVE
Influenza B by PCR: NEGATIVE
Resp Syncytial Virus by PCR: NEGATIVE
SARS Coronavirus 2 by RT PCR: NEGATIVE

## 2023-07-12 LAB — URINALYSIS, W/ REFLEX TO CULTURE (INFECTION SUSPECTED)
Bilirubin Urine: NEGATIVE
Glucose, UA: NEGATIVE mg/dL
Ketones, ur: 5 mg/dL — AB
Nitrite: NEGATIVE
Protein, ur: 100 mg/dL — AB
RBC / HPF: >50 RBC/hpf (ref 0–5)
Specific Gravity, Urine: 1.01 (ref 1.005–1.030)
WBC, UA: >50 WBC/hpf (ref 0–5)
pH: 5 (ref 5.0–8.0)

## 2023-07-12 LAB — CBC WITH DIFFERENTIAL/PLATELET
Abs Immature Granulocytes: 0.21 10*3/uL — ABNORMAL HIGH (ref 0.00–0.07)
Basophils Absolute: 0.2 10*3/uL — ABNORMAL HIGH (ref 0.0–0.1)
Basophils Relative: 1 %
Eosinophils Absolute: 0.1 10*3/uL (ref 0.0–0.5)
Eosinophils Relative: 1 %
HCT: 30.1 % — ABNORMAL LOW (ref 39.0–52.0)
Hemoglobin: 9.4 g/dL — ABNORMAL LOW (ref 13.0–17.0)
Immature Granulocytes: 1 %
Lymphocytes Relative: 7 %
Lymphs Abs: 1.3 10*3/uL (ref 0.7–4.0)
MCH: 27.1 pg (ref 26.0–34.0)
MCHC: 31.2 g/dL (ref 30.0–36.0)
MCV: 86.7 fL (ref 80.0–100.0)
Monocytes Absolute: 1.9 10*3/uL — ABNORMAL HIGH (ref 0.1–1.0)
Monocytes Relative: 10 %
Neutro Abs: 14.7 10*3/uL — ABNORMAL HIGH (ref 1.7–7.7)
Neutrophils Relative %: 80 %
Platelets: 284 10*3/uL (ref 150–400)
RBC: 3.47 MIL/uL — ABNORMAL LOW (ref 4.22–5.81)
RDW: 16 % — ABNORMAL HIGH (ref 11.5–15.5)
WBC: 18.5 10*3/uL — ABNORMAL HIGH (ref 4.0–10.5)
nRBC: 0 % (ref 0.0–0.2)

## 2023-07-12 LAB — BASIC METABOLIC PANEL
Anion gap: 16 — ABNORMAL HIGH (ref 5–15)
BUN: 16 mg/dL (ref 8–23)
CO2: 20 mmol/L — ABNORMAL LOW (ref 22–32)
Calcium: 8.7 mg/dL — ABNORMAL LOW (ref 8.9–10.3)
Chloride: 95 mmol/L — ABNORMAL LOW (ref 98–111)
Creatinine, Ser: 1.44 mg/dL — ABNORMAL HIGH (ref 0.61–1.24)
GFR, Estimated: 51 mL/min — ABNORMAL LOW (ref >60)
Glucose, Bld: 107 mg/dL — ABNORMAL HIGH (ref 70–99)
Potassium: 3.8 mmol/L (ref 3.5–5.1)
Sodium: 131 mmol/L — ABNORMAL LOW (ref 135–145)

## 2023-07-12 MED ORDER — SODIUM CHLORIDE 0.9 % IV BOLUS
500.0000 mL | Freq: Once | INTRAVENOUS | Status: AC
  Administered 2023-07-12: 500 mL via INTRAVENOUS

## 2023-07-12 MED ORDER — SODIUM CHLORIDE 0.9 % IV SOLN
1.0000 g | Freq: Once | INTRAVENOUS | Status: AC
  Administered 2023-07-12: 1 g via INTRAVENOUS
  Filled 2023-07-12: qty 10

## 2023-07-12 MED ORDER — SODIUM CHLORIDE 0.9 % IV SOLN
2.0000 g | INTRAVENOUS | Status: DC
  Administered 2023-07-13 – 2023-07-14 (×3): 2 g via INTRAVENOUS
  Filled 2023-07-12 (×3): qty 12.5

## 2023-07-12 MED ORDER — ACETAMINOPHEN 500 MG PO TABS
1000.0000 mg | ORAL_TABLET | Freq: Once | ORAL | Status: AC
  Administered 2023-07-13: 1000 mg via ORAL
  Filled 2023-07-12: qty 2

## 2023-07-12 MED ORDER — FENTANYL CITRATE PF 50 MCG/ML IJ SOSY
50.0000 ug | PREFILLED_SYRINGE | Freq: Once | INTRAMUSCULAR | Status: AC
  Administered 2023-07-12: 50 ug via INTRAVENOUS
  Filled 2023-07-12: qty 1

## 2023-07-12 MED ORDER — LACTATED RINGERS IV BOLUS (SEPSIS): 1000.0000 mL | Freq: Once | INTRAVENOUS | Status: DC

## 2023-07-12 MED ORDER — LEVOFLOXACIN IN D5W 750 MG/150ML IV SOLN: 750.0000 mg | Freq: Once | INTRAVENOUS | Status: DC

## 2023-07-12 MED ORDER — LACTATED RINGERS IV BOLUS
500.0000 mL | Freq: Once | INTRAVENOUS | Status: AC
  Administered 2023-07-13: 500 mL via INTRAVENOUS

## 2023-07-12 NOTE — ED Triage Notes (Signed)
 The pt reported that he has penis pain for weeks  he reports that he was sent here from his doctor

## 2023-07-12 NOTE — Assessment & Plan Note (Signed)
 Recheck kidneys urine dark

## 2023-07-12 NOTE — Progress Notes (Signed)
 ==============================  [facility]: [phone]   -- Medical Office Visit --  Patient: Calvin Escobar      Age: 73 y.o.       Sex:  male  Date:   07/12/2023 Today's Healthcare Provider: Bernardino KANDICE Cone, MD  ==============================   CHIEF COMPLAINT: Worsening confusion (Refuses to eat and drink water , is non-compliant. Sleeps in the morning instead of at night.)  SUBJECTIVE: Background This is a 73 y.o. male who has Bipolar affective disorder, depressed (HCC); Chronic bronchitis (HCC); Dementia (HCC); Hyperlipidemia; DCM (dilated cardiomyopathy) (HCC); OSA treated with BiPAP; HFrEF (heart failure with reduced ejection fraction) (HCC); Ascending aorta dilatation (HCC); Alcoholism in remission (HCC); Benign prostatic hyperplasia with urinary obstruction; PAC (premature atrial contraction); Primary insomnia; PUD (peptic ulcer disease); Generalized arthritis; Morbid obesity (HCC); Urinary incontinence; Hypertension; Limp; Gynecomastia, male; Chronic back pain; Stage 3 chronic kidney disease (HCC); Normocytic anemia; Tremor; Lack of appetite; Anemia of chronic disease; Syncope; Acute cystitis without hematuria; Diarrhea; AKI (acute kidney injury) (HCC); Hyponatremia; Obesity, Class II, BMI 35-39.9; E coli bacteremia; Septic shock from UTI; Acute respiratory failure with hypoxia (HCC); Thrombocytopenia (HCC); LBBB (left bundle branch block); Colovesical fistula; Urinary tract infection without hematuria; Severe sepsis (HCC); Urinary catheter complication (HCC); Abnormal urinalysis; and UTI (urinary tract infection) on their problem list.  History of Present Illness Calvin Escobar is a 73 year old male who presents with weakness, confusion, and pain at the base of the penis. He is accompanied by a caregiver who is concerned about his condition.  He has significant weakness and confusion, which have worsened since his recent discharge from a  five-day hospital stay where he was treated for a urinary tract infection and had a Foley catheter inserted, which was removed two weeks ago. Since discharge, he has been experiencing confusion and memory issues, described as worse than usual by his caregiver. He is unable to correctly state the current year, indicating altered mental status. His balance has deteriorated, making him a high fall risk, and he is noted to be sleeping during the day and staying awake at night.  He reports persistent pain at the base of the penis, described as 'extremely sore', since the removal of the Foley catheter. There is no specific radiation of pain beyond the base of the penis. He has a history of urinary tract infections and pain in the prostate area, which was noted when the Foley catheter was removed.  He has been struggling with eating and drinking, feeling 'absolutely terrible'. His caregiver notes he has not been consuming meals such as eggs, fish, lima beans, and mashed potatoes, and has been resistant to drinking water . Despite encouragement to drink water  and eat small amounts regularly, he remains reluctant.  He has been experiencing new tremors, as noted by his caregiver, and has been using a straw to drink coffee to avoid spilling. He reports having both stool and diarrhea.  His current medication regimen is unclear, as he has several medications to pick up from [facility], but his caregiver is unsure of what they are. Some medications require doctor approval, and he has not been taking all of them. He is not currently on antibiotics.  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  Discussed Past  Medical History -  Urinary tract infection - Prostatitis - Sepsis     Problem list overviews that were updated at today's visit:No problems updated.  Today's Verbally Confirmed  Current Outpatient Medications on File Prior to Visit  Medication Sig   ARIPiprazole  (ABILIFY ) 2 MG tablet Take 2 mg by mouth in the morning.   busPIRone  (BUSPAR ) 10 MG tablet Take 10 mg by mouth 3 (three) times daily.   calcium -vitamin D  (OSCAL WITH D) 500-5 MG-MCG tablet Take 1 tablet by mouth daily with breakfast.   divalproex  (DEPAKOTE  ER) 250 MG 24 hr tablet Take 500 mg by mouth at bedtime.   folic acid  (FOLVITE ) 1 MG tablet Take 1 tablet (1 mg total) by mouth daily. (Patient taking differently: Take 1 mg by mouth in the morning.)   gabapentin  (NEURONTIN ) 300 MG capsule Take 300 mg by mouth at bedtime.   levofloxacin  (LEVAQUIN ) 500 MG tablet Take 500 mg by mouth daily.   midodrine  (PROAMATINE ) 5 MG tablet Take 1 tablet (5 mg total) by mouth 3 (three) times daily with meals.   Multiple Vitamin (MULTIVITAMIN) tablet Take 1 tablet by mouth daily.   pantoprazole  (PROTONIX ) 40 MG tablet Take 1 tablet (40 mg total) by mouth at bedtime.   pravastatin  (PRAVACHOL ) 20 MG tablet Take 1 tablet (20 mg total) by mouth at bedtime.   primidone  (MYSOLINE ) 50 MG tablet Take 1 tablet (50 mg total) by mouth 2 (two) times daily.   tamsulosin  (FLOMAX ) 0.4 MG CAPS capsule TAKE 1 CAPSULE BY MOUTH EVERY DAY   traZODone  (DESYREL ) 100 MG tablet Take 300 mg by mouth at bedtime.   venlafaxine  XR (EFFEXOR -XR) 150 MG 24 hr capsule Take 300 mg by mouth daily with breakfast.   mirtazapine  (REMERON ) 30 MG tablet Take 30 mg by mouth at bedtime. (Patient not taking: Reported on 07/12/2023)   No current facility-administered medications on file prior to visit.  There are no discontinued medications.    Objective   Physical Exam     07/12/2023    3:36 PM 07/05/2023   12:00 PM 07/05/2023    7:27 AM  Vitals with BMI  Height 6' 1    Weight 224 lbs    BMI  29.56    Systolic 124 127 864  Diastolic 72 84 81  Pulse 122 88 94   Wt Readings from Last 10 Encounters:  07/12/23 224 lb (101.6 kg)  07/04/23 230 lb 2.6 oz (104.4 kg)  06/24/23 227 lb 3.2 oz (103.1 kg)  06/07/23 244 lb 7.8 oz (110.9 kg)  05/16/23 241 lb (109.3 kg)  05/08/23 241 lb (109.3 kg)  03/07/23 256 lb 6.4 oz (116.3 kg)  02/25/23 255 lb 3.2 oz (115.8 kg)  01/15/23 258 lb 9.6 oz (117.3 kg)  12/19/22 265 lb 3.2 oz (120.3 kg)   Vital signs reviewed.  Nursing notes reviewed. Weight trend reviewed. Abnormalities and Problem-Specific physical exam findings:  GENITOURINARY: Pain localized to the base of the penis. NEUROLOGICAL: Altered mental status with inability to recall the current year, impaired balance, and tremors observed. Significant weakness and high risk for falls.  URINE IN FOLEY BAG dark CAN'T SIT  STILL due to prostate pain severe, keeps standing up and nearly falling everytime he tries General Appearance:  toxic appearing male.  Well proportioned with no abnormal fat distribution.  Good muscle tone. Pulmonary:  Normal work of breathing at rest, no respiratory distress apparent. SpO2: 96 %  Musculoskeletal: All extremities are intact.  Neurological:  Awake, alert,  oriented, and engaged.  No obvious focal neurological deficits or cognitive impairments.  Sensorium seems slight clouded he says the year is 2000 something and gets confused  Speech is clear and coherent with logical content. Psychiatric:  depressed mood, distressed  cooperative demeanor, disengaged during the exam    No results found for any visits on 07/12/23. No results displayed because visit has over 200 results.    Office Visit on 06/24/2023  Component Date Value   WBC 06/24/2023 8.8    RBC 06/24/2023 3.52 (L)    Hemoglobin 06/24/2023 9.6 (L)    HCT 06/24/2023 30.2 (L)    MCV 06/24/2023 85.8    MCHC 06/24/2023 31.7    RDW 06/24/2023 17.7 (H)    Platelets 06/24/2023 256.0    Neutrophils  Relative % 06/24/2023 52.9    Lymphocytes Relative 06/24/2023 29.9    Monocytes Relative 06/24/2023 9.2    Eosinophils Relative 06/24/2023 5.4 (H)    Basophils Relative 06/24/2023 2.6    Neutro Abs 06/24/2023 4.7    Lymphs Abs 06/24/2023 2.6    Monocytes Absolute 06/24/2023 0.8    Eosinophils Absolute 06/24/2023 0.5    Basophils Absolute 06/24/2023 0.2 (H)    Sodium 06/24/2023 137    Potassium 06/24/2023 3.9    Chloride 06/24/2023 102    CO2 06/24/2023 28    Glucose, Bld 06/24/2023 128 (H)    BUN 06/24/2023 14    Creatinine, Ser 06/24/2023 1.23    Total Bilirubin 06/24/2023 0.3    Alkaline Phosphatase 06/24/2023 61    AST 06/24/2023 19    ALT 06/24/2023 9    Total Protein 06/24/2023 7.9    Albumin  06/24/2023 3.8    GFR 06/24/2023 57.80 (L)    Calcium  06/24/2023 9.0   No results displayed because visit has over 200 results.    No results displayed because visit has over 200 results.    Office Visit on 05/08/2023  Component Date Value   WBC 05/08/2023 6.5    RBC 05/08/2023 3.61 (L)    Platelets 05/08/2023 249.0    Hemoglobin 05/08/2023 10.1 (L)    HCT 05/08/2023 30.9 (L)    MCV 05/08/2023 85.6    MCHC 05/08/2023 32.8    RDW 05/08/2023 15.1    Ferritin 05/08/2023 >1500.0 (H)    Folate 05/08/2023 >24.2    Path Review 05/08/2023     Retic Ct Pct 05/08/2023 2.0    ABS Retic 05/08/2023 73,000    Sed Rate 05/08/2023 60 (H)    Vitamin B-12 05/08/2023 306   Appointment on 03/15/2023  Component Date Value   Area-P 1/2 03/15/2023 5.42    S' Lateral 03/15/2023 3.60    Est EF 03/15/2023 35 - 40%   Office Visit on 01/15/2023  Component Date Value   WBC 01/15/2023 8.5    RBC 01/15/2023 4.28    Hemoglobin 01/15/2023 12.5 (L)    HCT 01/15/2023 38.7 (L)    MCV 01/15/2023 90.3    MCHC 01/15/2023 32.2    RDW 01/15/2023 13.8    Platelets 01/15/2023 187.0    Neutrophils Relative % 01/15/2023 69.9    Lymphocytes Relative 01/15/2023 16.8    Monocytes Relative 01/15/2023 11.3     Eosinophils Relative 01/15/2023 1.4    Basophils Relative 01/15/2023 0.6    Neutro Abs 01/15/2023 5.9    Lymphs Abs 01/15/2023 1.4    Monocytes Absolute 01/15/2023 1.0    Eosinophils Absolute 01/15/2023 0.1    Basophils Absolute 01/15/2023 0.1  Sodium 01/15/2023 138    Potassium 01/15/2023 4.1    Chloride 01/15/2023 97    CO2 01/15/2023 33 (H)    Glucose, Bld 01/15/2023 112 (H)    BUN 01/15/2023 15    Creatinine, Ser 01/15/2023 1.16    Total Bilirubin 01/15/2023 0.5    Alkaline Phosphatase 01/15/2023 54    AST 01/15/2023 15    ALT 01/15/2023 9    Total Protein 01/15/2023 6.9    Albumin  01/15/2023 3.8    GFR 01/15/2023 62.20    Calcium  01/15/2023 9.3    Lipase 01/15/2023 15.0   Office Visit on 11/21/2022  Component Date Value   Sodium 11/21/2022 141    Potassium 11/21/2022 4.5    Chloride 11/21/2022 102    CO2 11/21/2022 31    Glucose, Bld 11/21/2022 106 (H)    BUN 11/21/2022 16    Creatinine, Ser 11/21/2022 1.12    GFR 11/21/2022 64.95    Calcium  11/21/2022 9.2    Vitamin B-12 11/21/2022 305    Folate 11/21/2022 >23.8    TSH 11/21/2022 0.968    Hgb A1c MFr Bld 11/21/2022 5.4   Lab on 08/29/2022  Component Date Value   Valproic Acid  Lvl 08/29/2022 29.3 (L)    TSH 08/29/2022 1.42    HIV 1&2 Ab, 4th Generati* 08/29/2022 NON-REACTIVE    Hepatitis C Ab 08/29/2022 NON-REACTIVE    Cholesterol 08/29/2022 138    Triglycerides 08/29/2022 138.0    HDL 08/29/2022 40.80    VLDL 08/29/2022 27.6    LDL Cholesterol 08/29/2022 69    Total CHOL/HDL Ratio 08/29/2022 3    NonHDL 08/29/2022 96.89    Sodium 08/29/2022 140    Potassium 08/29/2022 5.4 No hemolysis seen (H)    Chloride 08/29/2022 101    CO2 08/29/2022 31    Glucose, Bld 08/29/2022 107 (H)    BUN 08/29/2022 20    Creatinine, Ser 08/29/2022 1.14    Total Bilirubin 08/29/2022 0.5    Alkaline Phosphatase 08/29/2022 71    AST 08/29/2022 16    ALT 08/29/2022 13    Total Protein 08/29/2022 7.3    Albumin  08/29/2022  4.3    GFR 08/29/2022 63.69    Calcium  08/29/2022 9.3    WBC 08/29/2022 7.1    RBC 08/29/2022 4.62    Hemoglobin 08/29/2022 13.7    HCT 08/29/2022 41.4    MCV 08/29/2022 89.5    MCHC 08/29/2022 33.2    RDW 08/29/2022 14.5    Platelets 08/29/2022 170.0    Neutrophils Relative % 08/29/2022 69.3    Lymphocytes Relative 08/29/2022 20.9    Monocytes Relative 08/29/2022 6.9    Eosinophils Relative 08/29/2022 2.1    Basophils Relative 08/29/2022 0.8    Neutro Abs 08/29/2022 4.9    Lymphs Abs 08/29/2022 1.5    Monocytes Absolute 08/29/2022 0.5    Eosinophils Absolute 08/29/2022 0.2    Basophils Absolute 08/29/2022 0.1   No image results found. CT Head Wo Contrast Result Date: 07/03/2023 CLINICAL DATA:  Head trauma, minor (Age >= 65y) EXAM: CT HEAD WITHOUT CONTRAST TECHNIQUE: Contiguous axial images were obtained from the base of the skull through the vertex without intravenous contrast. RADIATION DOSE REDUCTION: This exam was performed according to the departmental dose-optimization program which includes automated exposure control, adjustment of the mA and/or kV according to patient size and/or use of iterative reconstruction technique. COMPARISON:  CT head 06/02/2023. FINDINGS: Brain: No evidence of acute infarction, hemorrhage, hydrocephalus, extra-axial collection or mass lesion/mass effect. Similar  chronic microvascular ischemic change and cerebral atrophy. Vascular: No hyperdense vessel.  Calcific atherosclerosis. Skull: No acute fracture. Sinuses/Orbits: Mostly clear sinuses.  No acute orbital findings. Other: No mastoid effusions. IMPRESSION: Stable head CT.  No evidence of acute intracranial abnormality. Electronically Signed   By: Gilmore GORMAN Molt M.D.   On: 07/03/2023 00:53   DG Chest Port 1 View Result Date: 07/01/2023 CLINICAL DATA:  Possible sepsis EXAM: PORTABLE CHEST 1 VIEW COMPARISON:  06/07/2023 FINDINGS: Cardiac shadow is stable. Poor inspiratory effort is noted. Calcified  granuloma is again noted in the right mid lung. No focal infiltrate or effusion is seen. No bony abnormality is noted. IMPRESSION: Poor inspiratory effort.  No acute abnormality noted. Electronically Signed   By: Oneil Devonshire M.D.   On: 07/01/2023 20:14   DG CHEST PORT 1 VIEW Result Date: 06/07/2023 CLINICAL DATA:  Shortness of breath. EXAM: PORTABLE CHEST 1 VIEW COMPARISON:  Chest radiographs 06/05/2023, 06/02/2023, 05/12/2023, 01/29/2016; CT chest 03/16/2008 FINDINGS: Cardiac silhouette and mediastinal contours are within normal limits for AP technique. Unchanged chronic benign calcified granuloma within the lateral right midlung. Moderately decreased lung volumes. Mild bilateral likely chronic interstitial thickening is unchanged from prior. No pleural effusion or pneumothorax. No acute skeletal abnormality. IMPRESSION: Moderately decreased lung volumes. No acute cardiopulmonary process. Electronically Signed   By: Tanda Lyons M.D.   On: 06/07/2023 11:01   DG CHEST PORT 1 VIEW Result Date: 06/05/2023 CLINICAL DATA:  Shortness of breath. EXAM: PORTABLE CHEST 1 VIEW COMPARISON:  Chest radiograph dated 06/02/2023. FINDINGS: The heart size and mediastinal contours are within normal limits. The lung volumes are low. Both lungs are clear. The visualized skeletal structures are unremarkable. IMPRESSION: Low lung volumes. No active disease. Electronically Signed   By: Norman Hopper M.D.   On: 06/05/2023 12:55   CT ABDOMEN PELVIS W WO CONTRAST Result Date: 06/02/2023 CLINICAL DATA:  73 year old male with history of dementia. Suspected urolithiasis. EXAM: CT ABDOMEN AND PELVIS WITHOUT AND WITH CONTRAST TECHNIQUE: Multidetector CT imaging of the abdomen and pelvis was performed following the standard protocol before and following the bolus administration of intravenous contrast. RADIATION DOSE REDUCTION: This exam was performed according to the departmental dose-optimization program which includes automated  exposure control, adjustment of the mA and/or kV according to patient size and/or use of iterative reconstruction technique. CONTRAST:  75mL OMNIPAQUE  IOHEXOL  350 MG/ML SOLN COMPARISON:  CT of the abdomen and pelvis 05/16/2023. FINDINGS: Lower chest: Calcified granuloma in the right middle lobe. Atherosclerotic calcifications in the left main, left anterior descending and right coronary arteries. Hepatobiliary: No definite suspicious cystic or solid hepatic lesions are confidently identified on today's noncontrast CT examination. Unenhanced appearance of the gallbladder is unremarkable. Pancreas: No definite pancreatic mass or peripancreatic fluid collections or inflammatory changes are noted on today's noncontrast CT examination. Spleen: Unremarkable. Adrenals/Urinary Tract: There is a large amount of gas present in the nondependent aspect of the urinary bladder. There also appears to be some debris dependently in the urinary bladder, which also contains a small amount of gas (best appreciated on axial image 91 of series 3). Superior aspect of the urinary bladder appears thickened and intimately associated with the adjacent sigmoid colon, centered around what appears to be a large diverticulum, best appreciated on coronal image 56 of series 10. Small amount of gas in the superior wall of the urinary bladder (coronal image 57 of series 10). Moderate right and mild left hydroureteronephrosis. Small amount of gas within the collecting systems of both  kidneys (right greater than left). Unenhanced appearance of the kidneys and bilateral adrenal glands is otherwise unremarkable. Stomach/Bowel: Unenhanced appearance of the stomach is normal. No pathologic dilatation of small bowel or colon. 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 Normal appendix. Vascular/Lymphatic: Atherosclerosis in the abdominal aorta and pelvic vasculature. No lymphadenopathy  noted in the abdomen or pelvis. Reproductive: Prostate gland and seminal vesicles are unremarkable in appearance. Other: No significant volume of ascites.  No pneumoperitoneum. Musculoskeletal: There are no aggressive appearing lytic or blastic lesions noted in the visualized portions of the skeleton. IMPRESSION: 1. 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 Urologic consultation is recommended for further clinical evaluation. 2. Aortic atherosclerosis. Electronically Signed   By: Toribio Aye M.D.   On: 06/02/2023 10:19   CT Head Wo Contrast Result Date: 06/02/2023 CLINICAL DATA:  The patient fell out of bed today sustaining head and neck trauma. EXAM: CT HEAD WITHOUT CONTRAST CT CERVICAL SPINE WITHOUT CONTRAST TECHNIQUE: Multidetector CT imaging of the head and cervical spine was performed following the standard protocol without intravenous contrast. Multiplanar CT image reconstructions of the cervical spine were also generated. RADIATION DOSE REDUCTION: This exam was performed according to the departmental dose-optimization program which includes automated exposure control, adjustment of the mA and/or kV according to patient size and/or use of iterative reconstruction technique. COMPARISON:  Head CT and cervical spine CT recently both on 05/13/2023. FINDINGS: CT HEAD FINDINGS Brain: There is mild global atrophy, mild atrophic ventriculomegaly and small-vessel disease of the cerebral white matter, chronic senescent mineralization in the basal ganglia. No new abnormality concerning for a cortical based acute infarct, hemorrhage, mass or mass effect is seen and no midline shift. Basal cisterns are clear. There have been no appreciable interval changes. Vascular: No hyperdense  vessel or unexpected calcification. Skull: Negative for fractures or focal lesions. Sinuses/Orbits: Patchy membrane thickening again noted in the ethmoid air cells with mild membrane disease in the maxillary sinuses. The frontal and sphenoid sinus, bilateral mastoid air cells, and middle ears are clear. The nasal septum is S shaped. Old lens extractions with otherwise negative orbits. Other: None. CT CERVICAL SPINE FINDINGS Alignment: Reversed cervical lordosis centered at C4-5, stable, with unchanged 3 mm grade 1 C4-5 anterolisthesis. No other listhesis is seen. Bone-on-bone anterior atlantodental joint space loss is also again noted, with osteophytes. Skull base and vertebrae: There is mild osteopenia without evidence of fractures, primary bone lesions or focal pathologic process. There is chronic ankylosis across the C4-5 and C5-6 facet joints. There are anterior bridging osteophytes C4-7. Soft tissues and spinal canal: No prevertebral fluid or swelling. No visible canal hematoma. There are minimal calcifications at the carotid bifurcations. No thyroid  or laryngeal mass. Disc levels: There is chronic disc collapse C5-6, C6-7, C7-T1, normal disc heights above C5. There are dorsal disc osteophyte complexes C4-5 through C7-T1, associated with spinal canal stenosis and mild spondylotic cord compression from C4-5 through C6-7. The upper levels do not show significant soft tissue or bony encroachment on the spinal canal. There is multilevel facet joint and uncinate hypertrophy, with foraminal stenosis which is severe on the left at C2-3, bilaterally severe C3-4 through C5-6, bilaterally moderate to severe C6-7. Upper chest: Negative. Other: None. IMPRESSION: 1. No acute intracranial CT findings  or depressed skull fractures. 2. Atrophy and small-vessel disease. 3. 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 4. Osteopenia and degenerative change without evidence of cervical fractures. 5. Reversed cervical lordosis with  grade 1 C4-5 degenerative anterolisthesis. 6. Multilevel spinal canal and foraminal stenosis. 7. Carotid atherosclerosis. Electronically Signed   By: Francis Quam M.D.   On: 06/02/2023 07:03   CT Cervical Spine Wo Contrast Result Date: 06/02/2023 CLINICAL DATA:  The patient fell out of bed today sustaining head and neck trauma. EXAM: CT HEAD WITHOUT CONTRAST CT CERVICAL SPINE WITHOUT CONTRAST TECHNIQUE: Multidetector CT imaging of the head and cervical spine was performed following the standard protocol without intravenous contrast. Multiplanar CT image reconstructions of the cervical spine were also generated. RADIATION DOSE REDUCTION: This exam was performed according to the departmental dose-optimization program which includes automated exposure control, adjustment of the mA and/or kV according to patient size and/or use of iterative reconstruction technique. COMPARISON:  Head CT and cervical spine CT recently both on 05/13/2023. FINDINGS: CT HEAD FINDINGS Brain: There is mild global atrophy, mild atrophic ventriculomegaly and small-vessel disease of the cerebral white matter, chronic senescent mineralization in the basal ganglia. No new abnormality concerning for a cortical based acute infarct, hemorrhage, mass or mass effect is seen and no midline shift. Basal cisterns are clear. There have been no appreciable interval changes. Vascular: No hyperdense vessel or unexpected calcification. Skull: Negative for fractures or focal lesions. Sinuses/Orbits: Patchy membrane thickening again noted in the ethmoid air cells with mild membrane disease in the maxillary sinuses. The frontal and sphenoid sinus, bilateral mastoid air cells, and middle ears are clear. The nasal septum is S shaped. Old lens extractions with otherwise negative orbits. Other: None. CT CERVICAL SPINE FINDINGS Alignment: Reversed cervical lordosis centered at C4-5, stable, with unchanged 3 mm grade 1 C4-5 anterolisthesis. No other listhesis is  seen. Bone-on-bone anterior atlantodental joint space loss is also again noted, with osteophytes. Skull base and vertebrae: There is mild osteopenia without evidence of fractures, primary bone lesions or focal pathologic process. There is chronic ankylosis across the C4-5 and C5-6 facet joints. There are anterior bridging osteophytes C4-7. Soft tissues and spinal canal: No prevertebral fluid or swelling. No visible canal hematoma. There are minimal calcifications at the carotid bifurcations. No thyroid  or laryngeal mass. Disc levels: There is chronic disc collapse C5-6, C6-7, C7-T1, normal disc heights above C5. There are dorsal disc osteophyte complexes C4-5 through C7-T1, associated with spinal canal stenosis and mild spondylotic cord compression from C4-5 through C6-7. The upper levels do not show significant soft tissue or bony encroachment on the spinal canal. There is multilevel facet joint and uncinate hypertrophy, with foraminal stenosis which is severe on the left at C2-3, bilaterally severe C3-4 through C5-6, bilaterally moderate to severe C6-7. Upper chest: Negative. Other: None. IMPRESSION: 1. No acute intracranial CT findings or depressed skull fractures. 2. Atrophy and small-vessel disease. 3. 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 4. Osteopenia and degenerative change without evidence of cervical fractures. 5. Reversed cervical lordosis with grade 1 C4-5 degenerative anterolisthesis. 6. Multilevel spinal canal and foraminal stenosis. 7. Carotid atherosclerosis. Electronically Signed   By: Francis Quam M.D.   On: 06/02/2023 07:03   DG Chest Port 1 View Result Date: 06/02/2023 CLINICAL DATA:  73 year old male with possible sepsis. Fall, altered mental status. EXAM: PORTABLE CHEST 1 VIEW COMPARISON:  Portable chest 05/12/2023 and earlier. FINDINGS: Portable AP semi upright view at 0546 hours. Stable low lung volumes. More rotated to  the left now. Stable cardiac size and mediastinal contours.  Stable lung volumes and ventilation. Calcified right midlung granuloma (no follow-up imaging recommended). No pneumothorax, pleural effusion, acute pulmonary opacity. Paucity of bowel gas the visible abdomen. No acute osseous abnormality identified. IMPRESSION: Chronically Low lung volumes. No acute cardiopulmonary abnormality or acute traumatic injury identified. Electronically Signed   By: VEAR Hurst M.D.   On: 06/02/2023 05:59   CT RENAL STONE STUDY Result Date: 05/16/2023 CLINICAL DATA:  Flank pain, bacteremia, UTI EXAM: CT ABDOMEN AND PELVIS WITHOUT CONTRAST TECHNIQUE: Multidetector CT imaging of the abdomen and pelvis was performed following the standard protocol without IV contrast. RADIATION DOSE REDUCTION: This exam was performed according to the departmental dose-optimization program which includes automated exposure control, adjustment of the mA and/or kV according to patient size and/or use of iterative reconstruction technique. COMPARISON:  09/12/2016 FINDINGS: Lower chest: No acute abnormality. Relative hypoattenuation of the cardiac blood pool indicative of anemia. Hepatobiliary: Unremarkable unenhanced appearance of the liver. No focal liver lesion identified. Gallbladder within normal limits. No hyperdense gallstone. No biliary dilatation. Pancreas: Unremarkable. No pancreatic ductal dilatation or surrounding inflammatory changes. Spleen: Normal in size without focal abnormality. Adrenals/Urinary Tract: Unremarkable adrenal glands. Mild left hydroureteronephrosis. No renal or ureteral calculi. No solid renal lesion. No right-sided hydronephrosis. There is wall thickening of the urinary bladder, most pronounced posteriorly. Moderate volume of air within the bladder lumen. Stomach/Bowel: Stomach is within normal limits. Appendix appears normal. No dilated loops of bowel. Extensive colonic diverticulosis. There is mild fat stranding adjacent to the proximal sigmoid colon. There is mild long segment  wall thickening of the sigmoid colon. No focally inflamed diverticulum is identified. Vascular/Lymphatic: Aortic atherosclerosis. No enlarged abdominal or pelvic lymph nodes. Reproductive: Mildly enlarged prostate gland. Other: No free fluid. No abdominopelvic fluid collection. No pneumoperitoneum. No abdominal wall hernia. Musculoskeletal: No acute or significant osseous findings. IMPRESSION: 1. Wall thickening of the urinary bladder, most pronounced posteriorly. Moderate volume of air within the bladder lumen. Findings are suggestive of cystitis. Correlate with urinalysis. 2. Mild left hydroureteronephrosis. No renal or ureteral calculi. 3. Extensive colonic diverticulosis. Mild fat stranding adjacent to the proximal sigmoid colon. There is mild long segment wall thickening of the sigmoid colon, which may reflect sequela of chronic diverticulitis. Early changes of diverticulitis is not excluded. Correlate with patient's symptoms. 4. Mildly enlarged prostate gland. 5. Relative hypoattenuation of the cardiac blood pool indicative of anemia. 6. Aortic atherosclerosis (ICD10-I70.0). Electronically Signed   By: Mabel Converse D.O.   On: 05/16/2023 11:33   ECHOCARDIOGRAM COMPLETE Result Date: 05/13/2023    ECHOCARDIOGRAM REPORT   Patient Name:   GARETH FITZNER Date of Exam: 05/13/2023 Medical Rec #:  [id number]   Height:       66.0 in Accession #:    [id number]  Weight:       241.0 lb Date of Birth:  1951-05-11   BSA:          2.165 m Patient Age:    72 years    BP:           126/73 mmHg Patient Gender: M           HR:           77 bpm. Exam Location:  Inpatient Procedure: 2D Echo, Cardiac Doppler, Color Doppler and Intracardiac            Opacification Agent Indications:    Syncope  History:  Patient has prior history of Echocardiogram examinations, most                 recent 03/15/2023. Risk Factors:Hypertension, Dyslipidemia and                 Sleep Apnea.  Sonographer:    Ozell Free Referring Phys: [id number]  CJDLWIYMJ RATHORE  Sonographer Comments: Technically difficult study due to poor echo windows. Image acquisition challenging due to respiratory motion. IMPRESSIONS  1. Abnormal septal motion. Left ventricular ejection fraction, by estimation, is 50 to 55%. The left ventricle has low normal function. The left ventricle has no regional wall motion abnormalities. There is mild left ventricular hypertrophy. Left ventricular diastolic parameters were normal.  2. Right ventricular systolic function is normal. The right ventricular size is normal. There is normal pulmonary artery systolic pressure.  3. The mitral valve is normal in structure. No evidence of mitral valve regurgitation. No evidence of mitral stenosis.  4. The aortic valve is tricuspid. There is moderate calcification of the aortic valve. There is moderate thickening of the aortic valve. Aortic valve regurgitation is not visualized. Aortic valve sclerosis is present, with no evidence of aortic valve stenosis.  5. The inferior vena cava is normal in size with greater than 50% respiratory variability, suggesting right atrial pressure of 3 mmHg. FINDINGS  Left Ventricle: Abnormal septal motion. Left ventricular ejection fraction, by estimation, is 50 to 55%. The left ventricle has low normal function. The left ventricle has no regional wall motion abnormalities. The left ventricular internal cavity size was normal in size. There is mild left ventricular hypertrophy. Left ventricular diastolic parameters were normal. Right Ventricle: The right ventricular size is normal. No increase in right ventricular wall thickness. Right ventricular systolic function is normal. There is normal pulmonary artery systolic pressure. The tricuspid regurgitant velocity is 2.74 m/s, and  with an assumed right atrial pressure of 3 mmHg, the estimated right ventricular systolic pressure is 33.0 mmHg. Left Atrium: Left atrial size was normal in size. Right Atrium: Right atrial size was  normal in size. Pericardium: There is no evidence of pericardial effusion. Mitral Valve: The mitral valve is normal in structure. No evidence of mitral valve regurgitation. No evidence of mitral valve stenosis. Tricuspid Valve: The tricuspid valve is normal in structure. Tricuspid valve regurgitation is not demonstrated. No evidence of tricuspid stenosis. Aortic Valve: The aortic valve is tricuspid. There is moderate calcification of the aortic valve. There is moderate thickening of the aortic valve. Aortic valve regurgitation is not visualized. Aortic valve sclerosis is present, with no evidence of aortic valve stenosis. Aortic valve mean gradient measures 4.0 mmHg. Aortic valve peak gradient measures 6.4 mmHg. Aortic valve area, by VTI measures 2.71 cm. Pulmonic Valve: The pulmonic valve was normal in structure. Pulmonic valve regurgitation is not visualized. No evidence of pulmonic stenosis. Aorta: The aortic root is normal in size and structure. Venous: The inferior vena cava is normal in size with greater than 50% respiratory variability, suggesting right atrial pressure of 3 mmHg. IAS/Shunts: No atrial level shunt detected by color flow Doppler.  LEFT VENTRICLE PLAX 2D LVIDd:         5.00 cm      Diastology LVIDs:         3.90 cm      LV e' medial:    7.29 cm/s LV PW:         1.20 cm      LV E/e' medial:  9.3 LV  IVS:        1.30 cm      LV e' lateral:   15.10 cm/s LVOT diam:     2.10 cm      LV E/e' lateral: 4.5 LV SV:         64 LV SV Index:   29 LVOT Area:     3.46 cm  LV Volumes (MOD) LV vol d, MOD A2C: 113.0 ml LV vol d, MOD A4C: 139.0 ml LV vol s, MOD A2C: 56.9 ml LV vol s, MOD A4C: 67.0 ml LV SV MOD A2C:     56.1 ml LV SV MOD A4C:     139.0 ml LV SV MOD BP:      64.9 ml RIGHT VENTRICLE RV Basal diam:  3.70 cm RV S prime:     19.10 cm/s TAPSE (M-mode): 3.2 cm LEFT ATRIUM             Index        RIGHT ATRIUM           Index LA diam:        3.70 cm 1.71 cm/m   RA Area:     15.00 cm LA Vol (A2C):   74.3  ml 34.32 ml/m  RA Volume:   31.00 ml  14.32 ml/m LA Vol (A4C):   52.9 ml 24.44 ml/m LA Biplane Vol: 67.8 ml 31.32 ml/m  AORTIC VALVE AV Area (Vmax):    2.75 cm AV Area (Vmean):   2.66 cm AV Area (VTI):     2.71 cm AV Vmax:           126.00 cm/s AV Vmean:          86.900 cm/s AV VTI:            0.235 m AV Peak Grad:      6.4 mmHg AV Mean Grad:      4.0 mmHg LVOT Vmax:         100.00 cm/s LVOT Vmean:        66.700 cm/s LVOT VTI:          0.184 m LVOT/AV VTI ratio: 0.78  AORTA Ao Root diam: 3.80 cm Ao Asc diam:  3.90 cm MITRAL VALVE               TRICUSPID VALVE MV Area (PHT): 3.93 cm    TR Peak grad:   30.0 mmHg MV Decel Time: 193 msec    TR Vmax:        274.00 cm/s MV E velocity: 67.90 cm/s MV A velocity: 83.90 cm/s  SHUNTS MV E/A ratio:  0.81        Systemic VTI:  0.18 m                            Systemic Diam: 2.10 cm Maude Emmer MD Electronically signed by Maude Emmer MD Signature Date/Time: 05/13/2023/10:16:49 AM    Final    CT HEAD WO CONTRAST ( ) Result Date: 05/13/2023 CLINICAL DATA:  Unwitnessed fall, altered mental status EXAM: CT HEAD WITHOUT CONTRAST CT CERVICAL SPINE WITHOUT CONTRAST TECHNIQUE: Multidetector CT imaging of the head and cervical spine was performed following the standard protocol without intravenous contrast. Multiplanar CT image reconstructions of the cervical spine were also generated. RADIATION DOSE REDUCTION: This exam was performed according to the departmental dose-optimization program which includes automated exposure control, adjustment of the mA and/or kV according to patient size  and/or use of iterative reconstruction technique. COMPARISON:  CT head dated 01/29/2016 FINDINGS: CT HEAD FINDINGS Brain: No evidence of acute infarction, hemorrhage, hydrocephalus, extra-axial collection or mass lesion/mass effect. Global cortical atrophy. Mild subcortical white matter and periventricular small vessel ischemic changes. Vascular: No hyperdense vessel or unexpected  calcification. Skull: Normal. Negative for fracture or focal lesion. Sinuses/Orbits: The visualized paranasal sinuses are essentially clear. The mastoid air cells are unopacified. Other: None. CT CERVICAL SPINE FINDINGS Alignment: Loss of the normal cervical lordosis. Skull base and vertebrae: No acute fracture. No primary bone lesion or focal pathologic process. Soft tissues and spinal canal: No prevertebral fluid or swelling. No visible canal hematoma. Disc levels: Mild degenerative changes of the mid/lower cervical spine. Spinal canal is patent. Upper chest: Negative. Other: None. IMPRESSION: No acute intracranial abnormality. Atrophy with small vessel ischemic changes. No traumatic injury to the cervical spine. Mild degenerative changes. Electronically Signed   By: Pinkie Pebbles M.D.   On: 05/13/2023 00:24   CT CERVICAL SPINE WO CONTRAST Result Date: 05/13/2023 CLINICAL DATA:  Unwitnessed fall, altered mental status EXAM: CT HEAD WITHOUT CONTRAST CT CERVICAL SPINE WITHOUT CONTRAST TECHNIQUE: Multidetector CT imaging of the head and cervical spine was performed following the standard protocol without intravenous contrast. Multiplanar CT image reconstructions of the cervical spine were also generated. RADIATION DOSE REDUCTION: This exam was performed according to the departmental dose-optimization program which includes automated exposure control, adjustment of the mA and/or kV according to patient size and/or use of iterative reconstruction technique. COMPARISON:  CT head dated 01/29/2016 FINDINGS: CT HEAD FINDINGS Brain: No evidence of acute infarction, hemorrhage, hydrocephalus, extra-axial collection or mass lesion/mass effect. Global cortical atrophy. Mild subcortical white matter and periventricular small vessel ischemic changes. Vascular: No hyperdense vessel or unexpected calcification. Skull: Normal. Negative for fracture or focal lesion. Sinuses/Orbits: The visualized paranasal sinuses are  essentially clear. The mastoid air cells are unopacified. Other: None. CT CERVICAL SPINE FINDINGS Alignment: Loss of the normal cervical lordosis. Skull base and vertebrae: No acute fracture. No primary bone lesion or focal pathologic process. Soft tissues and spinal canal: No prevertebral fluid or swelling. No visible canal hematoma. Disc levels: Mild degenerative changes of the mid/lower cervical spine. Spinal canal is patent. Upper chest: Negative. Other: None. IMPRESSION: No acute intracranial abnormality. Atrophy with small vessel ischemic changes. No traumatic injury to the cervical spine. Mild degenerative changes. Electronically Signed   By: Pinkie Pebbles M.D.   On: 05/13/2023 00:24   DG Chest Port 1 View Result Date: 05/13/2023 CLINICAL DATA:  Un witnessed fall, initial encounter EXAM: PORTABLE CHEST 1 VIEW COMPARISON:  01/29/16 FINDINGS: Cardiac shadow is mildly prominent but stable. Lungs are hypoinflated. Calcified granuloma is noted in the right mid lung stable from the prior exam. No acute bony abnormality is seen. IMPRESSION: Changes of prior granulomatous disease. No acute abnormality noted. Electronically Signed   By: Oneil Devonshire M.D.   On: 05/13/2023 00:03  DG Chest Port 1 View Result Date: 07/01/2023 CLINICAL DATA:  Possible sepsis EXAM: PORTABLE CHEST 1 VIEW COMPARISON:  06/07/2023 FINDINGS: Cardiac shadow is stable. Poor inspiratory effort is noted. Calcified granuloma is again noted in the right mid lung. No focal infiltrate or effusion is seen. No bony abnormality is noted. IMPRESSION: Poor inspiratory effort.  No acute abnormality noted. Electronically Signed   By: Oneil Devonshire M.D.   On: 07/01/2023 20:14       Assessment & Plan Chronic prostatitis  Stage 3b chronic kidney disease (HCC)  Recheck kidneys urine dark Severe sepsis (HCC) Sepsis hr 122 with AMS, severe prostate pain with resistant urinary tract infection (UTI) suspicious prostatic source. Possible sepsis  secondary to prostatitis or another infection. Symptoms include altered mental status, weakness, and potential systemic infection. Blood work is necessary to check for sepsis, and monitoring heart rate and fever is crucial. Send to the hospital for sepsis evaluation. Perform blood work for sepsis indicators. Administer appropriate antibiotics and supportive care. Dehydration Dehydration Struggling to drink water  and eat, leading to potential dehydration, contributing to weakness and altered mental status. Hydration and small, frequent meals are important. Administer IV fluids at the hospital. Encourage oral hydration and small, frequent meals. Altered mental status, unspecified altered mental status type Altered Mental Status Presents with confusion, memory issues, significant weakness, and unsteadiness, suggesting a potential underlying infection or systemic issue. High fall risk and unsafe for home discharge. Send to the hospital for further evaluation. Communicate 'altered mental status' and 'unsafe discharge' to ER staff. Consider advanced imaging and blood work to identify the underlying cause. Ataxia General imbalance, walks like drunken but not drinking. This is not usual for him. High fall risk. Unsafe to send home. Send to emergency room for re-evaluation for admission   General Health Maintenance Multiple medications need management and pickup from the pharmacy. Proper medication adherence is crucial for overall health. Review and manage current medications. Ensure all necessary medications are picked up from the pharmacy. Coordinate with other healthcare providers for comprehensive care.  Attestation:  I have personally spent  35 minutes involved in face-to-face and non-face-to-face activities for this patient on the day of the visit. Professional time spent includes the following activities:  Preparing to see the patient by reviewing medical records prior to and during the encounter;  Obtaining, documenting, and reviewing an updated medical history; Performing a medically appropriate examination;  Evaluating, synthesizing, and documenting the available clinical information in the EMR;  Coordinating/Communicating with other health care professionals; Independently interpreting results (not separately reported), Communicating, counseling, educating about results to the patient/family/caregiver (not separately reported); Collaboratively developing and communicating an individualized treatment plan with the patient; Placing medically necessary orders (for medications/tests/procedures/referrals);   This time was independent of any separately billable procedure(s).  The extended duration of this patient visit was medically necessary due to several factors:  The patient's health condition is multifaceted, requiring a comprehensive evaluation of patient and their past records to ensure accurate diagnosis and treatment planning; Effective patient education and communication, particularly for patients with complex care needs, often require additional time to ensure the patient (or caregivers) fully understand the care plan;  Coordination of care with other healthcare professionals and services depends on thorough documentation, extending both documentation time and visit durations.  All these factors are integral to providing high-quality patient care and ensuring optimal health outcomes.   This document was synthesized by artificial intelligence ([employer]) using HIPAA-compliant recording of the clinical interaction;   We discussed the use of AI scribe software for clinical note transcription with the patient, who gave verbal consent to proceed.    Additional Info: This encounter employed state-of-the-art, real-time, collaborative documentation. The patient actively reviewed and assisted in updating their electronic medical record on a shared screen, ensuring transparency and facilitating joint  problem-solving for the problem list, overview, and plan. This approach promotes accurate, informed care. The treatment plan was discussed and reviewed in detail, including medication safety, potential side effects, and all patient questions. We confirmed understanding and comfort with the plan.  Follow-up instructions were established, including contacting the office for any concerns, returning if symptoms worsen, persist, or new symptoms develop, and precautions for potential emergency department visits.

## 2023-07-12 NOTE — H&P (Signed)
 History and Physical      Calvin Escobar FMW:[id number] DOB: 12/30/1950 DOA: 07/12/2023; DOS: 07/12/2023  PCP: [name], MD  Patient coming from: home   I have personally briefly reviewed patient's old medical records in [facility]  Chief Complaint: Altered mental status  HPI: Calvin Escobar is a 73 y.o. male with medical history significant for dementia, hyperlipidemia, depression, GERD, BPH, anemia of chronic disease associated baseline hemoglobin 7-10, who is admitted to [facility] on 07/12/2023 with suspected acute metabolic encephalopathy after presenting from home to [facility] ED for eval ration of altered mental status.   In the context of the patient's altered mental status, following history is provided via my discussions with the EDP as well as via chart review.  Patient's caregivers feel that, relative to the patient's underlying dementia,that the patient has been more confused relative to baseline mental status over the last 1 to 2 days, after which time he has exhibited diminished oral intake.  Per chart review, he has a history of Pseudomonas UTI.  Baseline creatinine creatinine appears to be in the range of 1-1.3.  Per chart review, there was some initial question of a history of CHF.  Per chart review, most recent echocardiogram performed on 05/13/2023, which was notable for LVEF 50 to 55%, no evidence of focal lesion or mass, mild LVH, normal diastolic parameters, normal right ventricular systolic function, and no evidence of significant valvular pathology.     ED Course:  Vital signs in the ED were notable for the following: Temperature max 101.6, heart rates initially in the 120s, subsequent decreasing into the 90s following IV fluids; systolic pressures in the 1 teens 130s; respiratory rate 15-23, oxygen saturation 95 to 100% on room air.  Labs were notable for the following: BMP notable for the following: Sodium 131 compared to most recent prior  value of 137 on 07/05/2023, bicarbonate 20, anion gap 19, creatinine 1.44, glucose 127.  Lactic acid 1.0.  CBC notable for white cell count 18,500 with 80% neutrophils, hemoglobin 9.4 associated Neuraceq/Norocarp properties and unchanged from most recent prior hemoglobin value on 07/05/2023, platelet count 284.  Urinalysis associated with cloudy appearing specimen and notable for greater than 50 white blood cells, large leukocyte esterase, no evidence of squamous epithelial cells will also showing 5 ketones.  Blood cultures x 2 as well as urine culture were collected prior to initiation of IV antibiotics.  COVID, influenza, RSV PCR all negative.  Per my interpretation, EKG in ED demonstrated the following: No EKG performed in the ED today.  Imaging in the ED, per corresponding formal radiology read, was notable for the following: 1 view chest x-ray showed no evidence of acute cardiopulmonary process, Cleen evidence of infiltrate, edema, effusion, or pneumothorax.  While in the ED, the following were administered: Fentanyl  50 mcg IV x 1 dose, Rocephin  followed by initiation of cefepime , lactated Ringer 's x 500 cc bolus, normal saline x 5 (bolus.  Subsequently, the patient was admitted for further evaluation management of suspected acute metabolic encephalopathy in the setting of severe sepsis due to urinary tract infection, with presenting labs also notable for acute hyponatremia as well as anion gap metabolic acidosis.     Review of Systems: As per HPI otherwise 10 point review of systems negative.   Past Medical History:  Diagnosis Date   Allergy seasonal   Ascending aorta dilatation (HCC)    38 mm by 2D echo 04/2021   Cancer (HCC) skin  DCM (dilated cardiomyopathy) (HCC)    nonischemic with normal coronary arteries at cath 06/2019.  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 He has never used smokeless tobacco. He reports current alcohol use of about 14.0 standard drinks of alcohol per week. He reports that he does not use drugs.   Allergies  Allergen Reactions   Albuterol  Palpitations    Supraventricular Tachycardia     Family History  Problem Relation Age of Onset   Hypertension Mother    Cancer Father    Bone cancer Sister     Family history reviewed and not pertinent    Prior to Admission medications   Medication Sig Start Date End Date Taking? Authorizing Provider  ARIPiprazole  (ABILIFY ) 2 MG tablet Take 2 mg by mouth in the morning. 03/21/23  Yes [provider]  busPIRone  (BUSPAR ) 10 MG tablet Take 10 mg by mouth 3 (three) times daily. 12/27/22  Yes [provider]  calcium -vitamin D  (OSCAL WITH D) 500-5 MG-MCG tablet Take 1 tablet by mouth daily with breakfast.   Yes [provider]  divalproex  (DEPAKOTE  ER) 250 MG 24 hr tablet Take 500 mg by mouth at bedtime.   Yes [provider]  folic acid  (FOLVITE ) 1 MG tablet Take 1 tablet (1 mg total) by mouth daily. Patient taking differently: Take 1 mg by mouth in the morning. 03/07/23 03/06/24 Yes [name], MD  gabapentin  (NEURONTIN ) 300 MG capsule Take 300 mg by mouth at bedtime. 03/21/23  Yes [provider]  levofloxacin  (LEVAQUIN ) 500 MG tablet Take 500 mg by mouth daily.   Yes [provider]  midodrine  (PROAMATINE ) 5 MG tablet Take 1 tablet (5 mg total) by mouth 3 (three) times  daily with meals. 06/11/23  Yes Krishnan, Gokul, MD  mirtazapine  (REMERON ) 30 MG tablet Take 30 mg by mouth at bedtime. 07/09/23  Yes [provider]  Multiple Vitamin (MULTIVITAMIN) tablet Take 1 tablet by mouth daily.   Yes [provider]  pantoprazole  (PROTONIX ) 40 MG tablet Take 1 tablet (40 mg total) by mouth at bedtime. 07/05/23  Yes Gonfa, Taye T, MD  pravastatin  (PRAVACHOL ) 20 MG tablet Take 1 tablet (20 mg total) by mouth at bedtime. 12/19/22  Yes [name], MD  primidone  (MYSOLINE ) 50 MG tablet Take 1 tablet (50 mg total) by mouth 2 (two) times daily. 06/11/23  Yes Krishnan, Gokul, MD  tamsulosin  (FLOMAX ) 0.4 MG CAPS capsule TAKE 1 CAPSULE BY MOUTH EVERY DAY 11/12/22  Yes [name], MD  traZODone  (DESYREL ) 100 MG tablet Take 300 mg by mouth at bedtime. 02/06/20  Yes [provider]  venlafaxine  XR (EFFEXOR -XR) 150 MG 24 hr capsule Take 300 mg by mouth daily with breakfast.   Yes [provider]     Objective    Physical Exam: Vitals:  07/12/23 2009 07/12/23 2014 07/12/23 2130 07/12/23 2223  BP: (!) 172/88  133/73 120/67  Pulse: (!) 123   (!) 107  Resp: 20   16  Temp: 98.6 F (37 C)   (!) 101.6 F (38.7 C)  TempSrc: Oral   Oral  SpO2: 100%   97%  Weight:  101.6 kg    Height:  6' 1 (1.854 m)      General: appears to be stated age; alert, confused Skin: warm, dry, no rash Head:  AT/[place] Mouth:  Oral mucosa membranes appear dry, normal dentition Neck: supple; trachea midline Heart:  RRR; did not appreciate any M/R/G Lungs: CTAB, did not appreciate any wheezes, rales, or rhonchi Abdomen: + BS; soft, ND, NT Vascular: 2+ pedal pulses b/l; 2+ radial pulses b/l Extremities: no peripheral edema, no muscle wasting Neuro: strength and sensation intact in upper and lower extremities b/l   Labs on Admission: I have personally reviewed following labs and imaging studies  CBC: Recent Labs  Lab 07/12/23 2040  WBC 18.5*  NEUTROABS  14.7*  HGB 9.4*  HCT 30.1*  MCV 86.7  PLT 284   Basic Metabolic Panel: Recent Labs  Lab 07/12/23 2040  NA 131*  K 3.8  CL 95*  CO2 20*  GLUCOSE 107*  BUN 16  CREATININE 1.44*  CALCIUM  8.7*   GFR: Estimated Creatinine Clearance: 56.4 mL/min (A) (by C-G formula based on SCr of 1.44 mg/dL (H)). Liver Function Tests: No results for input(s): AST, ALT, ALKPHOS, BILITOT, PROT, ALBUMIN  in the last 168 hours. No results for input(s): LIPASE, AMYLASE in the last 168 hours. No results for input(s): AMMONIA in the last 168 hours. Coagulation Profile: No results for input(s): INR, PROTIME in the last 168 hours. Cardiac Enzymes: No results for input(s): CKTOTAL, CKMB, CKMBINDEX, TROPONINI in the last 168 hours. BNP (last 3 results) No results for input(s): PROBNP in the last 8760 hours. HbA1C: No results for input(s): HGBA1C in the last 72 hours. CBG: No results for input(s): GLUCAP in the last 168 hours. Lipid Profile: No results for input(s): CHOL, HDL, LDLCALC, TRIG, CHOLHDL, LDLDIRECT in the last 72 hours. Thyroid  Function Tests: No results for input(s): TSH, T4TOTAL, FREET4, T3FREE, THYROIDAB in the last 72 hours. Anemia Panel: No results for input(s): VITAMINB12, FOLATE, FERRITIN, TIBC, IRON , RETICCTPCT in the last 72 hours. Urine analysis:    Component Value Date/Time   COLORURINE AMBER (A) 07/12/2023 2034   APPEARANCEUR CLOUDY (A) 07/12/2023 2034   LABSPEC 1.010 07/12/2023 2034   PHURINE 5.0 07/12/2023 2034   GLUCOSEU NEGATIVE 07/12/2023 2034   HGBUR LARGE (A) 07/12/2023 2034   BILIRUBINUR NEGATIVE 07/12/2023 2034   KETONESUR 5 (A) 07/12/2023 2034   PROTEINUR 100 (A) 07/12/2023 2034   UROBILINOGEN 0.2 09/12/2013 1446   NITRITE NEGATIVE 07/12/2023 2034   LEUKOCYTESUR LARGE (A) 07/12/2023 2034    Radiological Exams on Admission: DG Chest 1 View Result Date: 07/12/2023 CLINICAL DATA:  Altered  mental status.  Penis pain. EXAM: CHEST  1 VIEW COMPARISON:  07/01/2023. FINDINGS: The heart size and mediastinal contours are within normal limits. No consolidation, effusion, or pneumothorax is seen. There is a stable calcified granuloma in the mid right lung. No acute osseous abnormality. IMPRESSION: No active disease. Electronically Signed   By: Leita Birmingham M.D.   On: 07/12/2023 20:52      Assessment/Plan   Principal Problem:   Acute metabolic encephalopathy Active Problems:   Acute cystitis   Acute hyponatremia   BPH (benign  prostatic hyperplasia)   HLD (hyperlipidemia)   Anemia of chronic disease   Severe sepsis (HCC)   High anion gap metabolic acidosis   GERD (gastroesophageal reflux disease)     #) Acute metabolic encephalopathy: 1 to 2 days of confusion relative to baseline mental status and relative to the patient's underlying dementia, per caregivers.  This  appears to be on the basis of physiologic stressors stemming from presenting severe sepsis due to urinary tract infection.  There may be additional metabolic contributions from presenting anion gap metabolic acidosis as well as acute hyponatremia, although this acute hyponatremia appears relatively mild in nature.    No obvious additional contributory underlying infectious process at this time, including presenting chest x-ray that showed no evidence of acute cardiopulmonary process, including no evidence of infiltrate, while COVID, influenza, RSV PCR were all negative.    Differential also includes potential toxic encephalopathy in the setting of the patient's polypharmacy as an outpatient, including several centrally acting medications, including Abilify , Effexor , BuSpar , Depakote , gabapentin , primidone , trazodone .   No overt acute focal neurologic deficits to suggest a contribution from an underlying acute CVA. Seizures are also felt to be less likely. Will check VBG to evaluate for any contribution from hypercapnic  encephalopathy, particular given the volume of central acting medications that he is on at home..  Plan: fall precautions. Delirium precautions. Repeat CMP/CBC in the AM. Check Mg level. check TSH, B12 level. Check vbg.  Check ammonia level, CPK level, urinary drug screen.  Add on procalcitonin level.  Further evaluation management of presenting severe sepsis due to UTI, as below.  Check Depakote  level.               #) Severe sepsis due to acute cystitis: In the setting of his presenting 1 to 2 days of altered mental status, he is found to have an objective fever in the ED of 101.6, while urinalysis is suspicious for urinary tract infection given cloudy appearing specimen with significant pyuria, large leukocyte esterase, no evidence of squamous epithelial cells to suggest a contaminated specimen.  SIRS criteria met via objective fever, tachycardia, tachypnea, interval development of leukocytosis with neutrophilic predominance, as further quantified above. Lactic acid level: 1.0. Of note, given the associated presence of suspected end organ damage in the form of concominant presenting acute metabolic encephalopathy, criteria are met for pt's sepsis to be considered severe in nature. However, in the absence of lactic acid level that is greater than or equal to 4.0, and in the absence of any associated hypotension refractory to IVF's, there are no indications for administration of a 30 mL/kg IVF bolus at this time.   Additional ED work-up/management notable for: Collection of blood cultures x 2 as well as urine culture.  Initially patient received a dose of Rocephin , before receiving cefepime , with the latter initiated due to his documented history of prior Pseudomonas UTI.  Given this latter history, will continue aforementioned cefepime .  No e/o additional infectious process at this time, including chest x-ray showed no evidence of acute cardiopulmonary process, including no evidence of  infiltrate, while COVID, influenza, RSV PCR were all negative.  Plan: CBC w/ diff and CMP in AM.  Follow for results of blood cx's x 2 as well as urine culture. Abx: Continue cefepime , as above.  Add on procalcitonin level.  As needed acetaminophen  for fever.  Monitor on telemetry.                   #)  Acute hypo-osmolar hypovolemic hyponatremia: Suspect an element of hypovolumeia, with suspected contribution from dehydration in the setting of clinical evidence of such as well as report of recent decline in oral intake coinciding with the timeframe of decline of serum sodium levels.  Differential also includes the possibility of a contribution from SIADH. in general, will monitor for response and serum sodium trend following IV fluids were administered in the ED this evening to attend to suspected contribution from dehydration, while further evaluating for any additional contributing factors, including SIADH, as further detailed below. Will also check TSH.  While he is on several outpatient central acting medications that can contribute to hyponatremia, it does not appear that any of his medications are new for him.  Therefore, it appears at this time that pharmacologic factors are less likely.  Of note, his chest x-ray showed no evidence of acute cardiopulmonary process.  His history of BPH is also notable, given associated increased risk for SIADH like response from urinary retention.   Plan: monitor strict I's and O's and daily weights.  check random urine sodium, urine osmolality.  Check serum osmolality to confirm suspected hypoosmolar etiology.  Repeat CMP in the morning. Check TSH. Check serum uric acid level, as SIADH can be associated with hypouricemia due to hyperuricuria.                   #) Anion gap metabolic acidosis: Noted on presenting BMP, with presenting bicarbonate 20, anion gap 16.  Suspect contribution from starvation ketosis given report of  diminished oral intake over the last 1 to 2 days, with evidence of ketones on presenting urinalysis.  While the patient presents with severe sepsis due to urinary tract infection, his presenting lactic acid level is not elevated, as quantified above.  Will further expand laboratory evaluation, as outlined below.  Plan: Check blood gas, CPK level, salicylate level, INR.  Repeat CMP, CBC in the morning.                   #) Hyperlipidemia: documented h/o such. On pravastatin  as outpatient.   Plan: continue home statin.  Follow-up for result of CPK level.                     #) GERD: documented h/o such; on Protonix  as outpatient.   Plan: continue home PPI.                       #) Benign Prostatic Hyperplasia:  documented h/o such; on tamsulosin  as outpatient.   Plan: monitor strict I's & O's and daily weights. Repeat CMP in AM.  continue home tamsulosin .                      #) Anemia of chronic disease: Documented history of such, a/w with baseline hgb range 7-10, with presenting hgb consistent with this range, in the absence of any overt evidence of active bleed.     Plan: Repeat CBC in the morning.  Check INR.     DVT prophylaxis: SCD's   Code Status: DNR/DNI, consistent with code status documentation from recent prior hospitalization Family Communication: none Disposition Plan: Per Rounding Team Consults called: none;  Admission status: Inpatient     I SPENT GREATER THAN 75  MINUTES IN CLINICAL CARE TIME/MEDICAL DECISION-MAKING IN COMPLETING THIS ADMISSION.      Hong Moring B Mariem Skolnick DO [facility]  From 7PM - 7AM  07/12/2023, 11:58 PM

## 2023-07-12 NOTE — Patient Instructions (Signed)
 VISIT SUMMARY:  Today, you were seen for weakness, confusion, and pain at the base of your penis. Your caregiver is concerned about your condition, especially since it has worsened after your recent hospital stay for a urinary tract infection. You have been experiencing significant weakness, confusion, and memory issues, along with pain at the base of your penis since the removal of your Foley catheter. Additionally, you have been struggling with eating and drinking, and have new tremors. Your current medication regimen is unclear, and you have not been taking all of your prescribed medications.  YOUR PLAN:  -ALTERED MENTAL STATUS: Altered mental status means changes in your brain function, causing confusion and memory issues. You are at high risk of falling and it is unsafe for you to stay at home. You will be sent to the hospital for further evaluation, including advanced imaging and blood work to find the cause.  -SEPSIS: Sepsis is a serious infection that spreads throughout your body. Your symptoms suggest a possible systemic infection. You will be sent to the hospital for blood work to check for sepsis, and you will receive antibiotics and supportive care if needed.  -PROSTATITIS: Prostatitis is an infection of the prostate gland, causing pain at the base of your penis. There may be an abscess in your prostate. You will be sent to the hospital for an MRI or ultrasound to check for an abscess, which may need to be drained. You will also receive antibiotics.  I suspect prostate abscess is responsible because there was blood on the foley I removed, and the severe prostate pain, and the failure to improve with antibiotics, and the signs & symptoms of recurrent sepsis despite antibiotic(s).  -DEHYDRATION: Dehydration occurs when your body does not have enough fluids. This can cause weakness and confusion. You will receive IV fluids at the hospital and are encouraged to drink water  and eat small, frequent  meals.  -GENERAL HEALTH MAINTENANCE: Proper management of your medications is important for your overall health. We will review and manage your current medications and ensure all necessary medications are picked up from the pharmacy. Coordination with other healthcare providers will be done for comprehensive care.  INSTRUCTIONS:  You will be sent to the hospital for further evaluation and treatment. Please follow the hospital's instructions and ensure that your caregiver communicates all your symptoms and medication needs to the hospital staff.

## 2023-07-12 NOTE — ED Provider Triage Note (Signed)
 Emergency Medicine Provider Triage Evaluation Note  Calvin Escobar , a 73 y.o. male  was evaluated in triage.  Pt complains of AMS, Hx dementia  Review of Systems  Positive: AMS, unable to assess anything else Negative:   Physical Exam  There were no vitals taken for this visit. Gen:   Awake, no distress, altered Resp:  Normal effort  MSK:   Moves extremities without difficulty  Other:  Penis pain  Medical Decision Making  Medically screening exam initiated at 8:07 PM.  Appropriate orders placed.  Calvin Escobar was informed that the remainder of the evaluation will be completed by another provider, this initial triage assessment does not replace that evaluation, and the importance of remaining in the ED until their evaluation is complete.  Labs and imaging ordered   Calvin Escobar 07/12/23 2026

## 2023-07-12 NOTE — ED Provider Notes (Addendum)
 [place] EMERGENCY DEPARTMENT AT [facility] Provider Note   CSN: [id number] Arrival date & time: 07/12/23  1722     History  Chief Complaint  Patient presents with   Penis Pain    Calvin Escobar is a 73 y.o. male.  Patient with history of dementia, dilated cardiomyopathy, recent admission for Pseudomonas UTI presents with dysuria/penile pain and confusion.  Patient recently admitted for significant urine infection 1 week prior.  Patient explains primary concern is pain near catheter site.  Patient denies fever.  Patient denies respiratory symptoms.  The history is provided by the patient.  Penis Pain This is a recurrent problem. Pertinent negatives include no chest pain, no abdominal pain, no headaches and no shortness of breath.       Home Medications Prior to Admission medications   Medication Sig Start Date End Date Taking? Authorizing Provider  ARIPiprazole  (ABILIFY ) 2 MG tablet Take 2 mg by mouth in the morning. 03/21/23   [provider]  busPIRone  (BUSPAR ) 10 MG tablet Take 10 mg by mouth 3 (three) times daily. 12/27/22   [provider]  calcium -vitamin D  (OSCAL WITH D) 500-5 MG-MCG tablet Take 1 tablet by mouth daily with breakfast.    [provider]  divalproex  (DEPAKOTE  ER) 250 MG 24 hr tablet Take 500 mg by mouth at bedtime.    [provider]  folic acid  (FOLVITE ) 1 MG tablet Take 1 tablet (1 mg total) by mouth daily. Patient taking differently: Take 1 mg by mouth in the morning. 03/07/23 03/06/24  [name], MD  gabapentin  (NEURONTIN ) 300 MG capsule Take 300 mg by mouth at bedtime. 03/21/23   [provider]  levofloxacin  (LEVAQUIN ) 500 MG tablet Take 500 mg by mouth daily.    [provider]  midodrine  (PROAMATINE ) 5 MG tablet Take 1 tablet (5 mg total) by mouth 3 (three) times daily with meals. 06/11/23   Krishnan, Gokul, MD  mirtazapine  (REMERON ) 30 MG tablet Take 30 mg by mouth at  bedtime. Patient not taking: Reported on 07/12/2023 07/09/23   [provider]  Multiple Vitamin (MULTIVITAMIN) tablet Take 1 tablet by mouth daily.    [provider]  pantoprazole  (PROTONIX ) 40 MG tablet Take 1 tablet (40 mg total) by mouth at bedtime. 07/05/23   Gonfa, Taye T, MD  pravastatin  (PRAVACHOL ) 20 MG tablet Take 1 tablet (20 mg total) by mouth at bedtime. 12/19/22   [name], MD  primidone  (MYSOLINE ) 50 MG tablet Take 1 tablet (50 mg total) by mouth 2 (two) times daily. 06/11/23   Krishnan, Gokul, MD  tamsulosin  (FLOMAX ) 0.4 MG CAPS capsule TAKE 1 CAPSULE BY MOUTH EVERY DAY 11/12/22   [name], MD  traZODone  (DESYREL ) 100 MG tablet Take 300 mg by mouth at bedtime. 02/06/20   [provider]  venlafaxine  XR (EFFEXOR -XR) 150 MG 24 hr capsule Take 300 mg by mouth daily with breakfast.    [provider]      Allergies    Albuterol     Review of Systems   Review of Systems  Constitutional:  Negative for chills and fever.  HENT:  Negative for congestion.   Eyes:  Negative for visual disturbance.  Respiratory:  Negative for shortness of breath.   Cardiovascular:  Negative for chest pain.  Gastrointestinal:  Negative for abdominal pain and vomiting.  Genitourinary:  Positive for dysuria and penile pain. Negative for flank pain.  Musculoskeletal:  Negative for back pain, neck  pain and neck stiffness.  Skin:  Negative for rash.  Neurological:  Negative for light-headedness and headaches.    Physical Exam Updated Vital Signs BP 120/67   Pulse (!) 107   Temp (!) 101.6 F (38.7 C) (Oral)   Resp 16   Ht 6' 1 (1.854 m)   Wt 101.6 kg   SpO2 97%   BMI 29.55 kg/m  Physical Exam Vitals and nursing note reviewed.  Constitutional:      General: He is not in acute distress.    Appearance: He is well-developed.  HENT:     Head: Normocephalic and atraumatic.     Mouth/Throat:     Mouth: Mucous membranes are dry.  Eyes:     General:         Right eye: No discharge.        Left eye: No discharge.     Conjunctiva/sclera: Conjunctivae normal.  Neck:     Trachea: No tracheal deviation.  Cardiovascular:     Rate and Rhythm: Regular rhythm. Tachycardia present.  Pulmonary:     Effort: Pulmonary effort is normal.     Breath sounds: Normal breath sounds.  Abdominal:     General: There is no distension.     Palpations: Abdomen is soft.     Tenderness: There is no abdominal tenderness. There is no guarding.  Genitourinary:    Comments: Patient has mild warmth erythema glands, catheter in place. Musculoskeletal:        General: No swelling.     Cervical back: Normal range of motion and neck supple. No rigidity.  Skin:    General: Skin is warm.     Capillary Refill: Capillary refill takes less than 2 seconds.     Findings: Rash present.  Neurological:     General: No focal deficit present.     Mental Status: He is alert.     Comments: Initially patient following commands, discernible speech.  No focal weakness. Mild global confusion  Psychiatric:     Comments: Intermittent confusion     ED Results / Procedures / Treatments   Labs (all labs ordered are listed, but only abnormal results are displayed) Labs Reviewed  URINALYSIS, W/ REFLEX TO CULTURE (INFECTION SUSPECTED) - Abnormal; Notable for the following components:      Result Value   Color, Urine AMBER (*)    APPearance CLOUDY (*)    Hgb urine dipstick LARGE (*)    Ketones, ur 5 (*)    Protein, ur 100 (*)    Leukocytes,Ua LARGE (*)    Bacteria, UA FEW (*)    All other components within normal limits  BASIC METABOLIC PANEL - Abnormal; Notable for the following components:   Sodium 131 (*)    Chloride 95 (*)    CO2 20 (*)    Glucose, Bld 107 (*)    Creatinine, Ser 1.44 (*)    Calcium  8.7 (*)    GFR, Estimated 51 (*)    Anion gap 16 (*)    All other components within normal limits  CBC WITH DIFFERENTIAL/PLATELET - Abnormal; Notable for the following  components:   WBC 18.5 (*)    RBC 3.47 (*)    Hemoglobin 9.4 (*)    HCT 30.1 (*)    RDW 16.0 (*)    Neutro Abs 14.7 (*)    Monocytes Absolute 1.9 (*)    Basophils Absolute 0.2 (*)    Abs Immature Granulocytes 0.21 (*)    All other  components within normal limits  RESP PANEL BY RT-PCR (RSV, FLU A&B, COVID)  RVPGX2  URINE CULTURE  CULTURE, BLOOD (ROUTINE X 2)  CULTURE, BLOOD (ROUTINE X 2)  I-STAT CG4 LACTIC ACID, ED    EKG None  Radiology DG Chest 1 View Result Date: 07/12/2023 CLINICAL DATA:  Altered mental status.  Penis pain. EXAM: CHEST  1 VIEW COMPARISON:  07/01/2023. FINDINGS: The heart size and mediastinal contours are within normal limits. No consolidation, effusion, or pneumothorax is seen. There is a stable calcified granuloma in the mid right lung. No acute osseous abnormality. IMPRESSION: No active disease. Electronically Signed   By: Leita Birmingham M.D.   On: 07/12/2023 20:52    Procedures .Critical Care  Performed by: Tonia Chew, MD Authorized by: Tonia Chew, MD   Critical care provider statement:    Critical care time (minutes):  38   Critical care start time:  07/12/2023 10:40 PM   Critical care end time:  07/12/2023 11:18 PM   Critical care time was exclusive of:  Separately billable procedures and treating other patients and teaching time   Critical care was necessary to treat or prevent imminent or life-threatening deterioration of the following conditions:  Sepsis   Critical care was time spent personally by me on the following activities:  Pulse oximetry, re-evaluation of patient's condition, review of old charts, ordering and review of laboratory studies, discussions with primary provider and discussions with consultants     Medications Ordered in ED Medications  acetaminophen  (TYLENOL ) tablet 1,000 mg (has no administration in time range)  levofloxacin  (LEVAQUIN ) IVPB 750 mg (has no administration in time range)  sodium chloride  0.9 % bolus 500 mL  (500 mLs Intravenous New Bag/Given 07/12/23 2217)  cefTRIAXone  (ROCEPHIN ) 1 g in sodium chloride  0.9 % 100 mL IVPB (1 g Intravenous New Bag/Given 07/12/23 2218)  fentaNYL  (SUBLIMAZE ) injection 50 mcg (50 mcg Intravenous Given 07/12/23 2219)    ED Course/ Medical Decision Making/ A&P                                 Medical Decision Making Risk OTC drugs. Prescription drug management. Decision regarding hospitalization.   Patient presents with penile discomfort, recent admission for complex UTI.  Initially patient answered majority of questions appropriately however patient had worsening confusion and waxing waning mental status.  Patient developed a fever which explains his tachycardia.  Blood work ordered independently reviewed showing mild hyponatremia 131 leukocytosis 18.5, hemoglobin 9.4.  Urinalysis reviewed concerning for infection with multiple white blood cells and large hemoglobin.  After patient became encephalopathic and developed fever code sepsis order set utilized.  Initially Rocephin  was ordered review of medical records and discharge summary patient had recent Pseudomonas.  Patient did not have Rocephin  completed as he pulled his IV out.  Patient will need new IV placed, lactate and blood cultures ordered.  Nursing assisting.  IV fluid bolus 500 cc given.  Patient has heart failure history she will monitor closely and follow-up lactate.   Pharmacy consulted, reviewed medical records and cefepime  ordered.   Plan to admit to hospitalist, patient care will be signed out to monitor for worsening signs of sepsis/vital signs.        Final Clinical Impression(s) / ED Diagnoses Final diagnoses:  Acute cystitis without hematuria  Acute encephalopathy  Fever in adult  Hyponatremia  Acute renal insufficiency    Rx / DC Orders ED Discharge Orders  None         Tonia Chew, MD 07/12/23 7679    Tonia Chew, MD 07/12/23 [phone]

## 2023-07-12 NOTE — Sepsis Progress Note (Signed)
 Elink monitoring for the code sepsis protocol.

## 2023-07-12 NOTE — Assessment & Plan Note (Signed)
 Sepsis hr 122 with AMS, severe prostate pain with resistant urinary tract infection (UTI) suspicious prostatic source. Possible sepsis secondary to prostatitis or another infection. Symptoms include altered mental status, weakness, and potential systemic infection. Blood work is necessary to check for sepsis, and monitoring heart rate and fever is crucial. Send to the hospital for sepsis evaluation. Perform blood work for sepsis indicators. Administer appropriate antibiotics and supportive care.

## 2023-07-13 ENCOUNTER — Encounter (HOSPITAL_COMMUNITY): Payer: Self-pay | Admitting: Internal Medicine

## 2023-07-13 DIAGNOSIS — K219 Gastro-esophageal reflux disease without esophagitis: Secondary | ICD-10-CM | POA: Diagnosis not present

## 2023-07-13 DIAGNOSIS — G9341 Metabolic encephalopathy: Secondary | ICD-10-CM | POA: Diagnosis not present

## 2023-07-13 DIAGNOSIS — N3000 Acute cystitis without hematuria: Secondary | ICD-10-CM | POA: Diagnosis not present

## 2023-07-13 DIAGNOSIS — E871 Hypo-osmolality and hyponatremia: Secondary | ICD-10-CM | POA: Diagnosis not present

## 2023-07-13 DIAGNOSIS — D638 Anemia in other chronic diseases classified elsewhere: Secondary | ICD-10-CM

## 2023-07-13 DIAGNOSIS — N400 Enlarged prostate without lower urinary tract symptoms: Secondary | ICD-10-CM

## 2023-07-13 DIAGNOSIS — E7800 Pure hypercholesterolemia, unspecified: Secondary | ICD-10-CM

## 2023-07-13 DIAGNOSIS — E8729 Other acidosis: Secondary | ICD-10-CM

## 2023-07-13 HISTORY — DX: Other acidosis: E87.29

## 2023-07-13 LAB — CBC WITH DIFFERENTIAL/PLATELET
Abs Immature Granulocytes: 0.18 10*3/uL — ABNORMAL HIGH (ref 0.00–0.07)
Basophils Absolute: 0.1 10*3/uL (ref 0.0–0.1)
Basophils Relative: 1 %
Eosinophils Absolute: 0.3 10*3/uL (ref 0.0–0.5)
Eosinophils Relative: 2 %
HCT: 27.6 % — ABNORMAL LOW (ref 39.0–52.0)
Hemoglobin: 8.8 g/dL — ABNORMAL LOW (ref 13.0–17.0)
Immature Granulocytes: 1 %
Lymphocytes Relative: 7 %
Lymphs Abs: 1 10*3/uL (ref 0.7–4.0)
MCH: 27.2 pg (ref 26.0–34.0)
MCHC: 31.9 g/dL (ref 30.0–36.0)
MCV: 85.2 fL (ref 80.0–100.0)
Monocytes Absolute: 1.3 10*3/uL — ABNORMAL HIGH (ref 0.1–1.0)
Monocytes Relative: 9 %
Neutro Abs: 11.1 10*3/uL — ABNORMAL HIGH (ref 1.7–7.7)
Neutrophils Relative %: 80 %
Platelets: 228 10*3/uL (ref 150–400)
RBC: 3.24 MIL/uL — ABNORMAL LOW (ref 4.22–5.81)
RDW: 16 % — ABNORMAL HIGH (ref 11.5–15.5)
WBC: 13.9 10*3/uL — ABNORMAL HIGH (ref 4.0–10.5)
nRBC: 0 % (ref 0.0–0.2)

## 2023-07-13 LAB — C DIFFICILE QUICK SCREEN W PCR REFLEX
C Diff antigen: POSITIVE — AB
C Diff interpretation: DETECTED
C Diff toxin: POSITIVE — AB

## 2023-07-13 LAB — CK: Total CK: 117 U/L (ref 49–397)

## 2023-07-13 LAB — OSMOLALITY, URINE: Osmolality, Ur: 282 mosm/kg — ABNORMAL LOW (ref 300–900)

## 2023-07-13 LAB — TSH: TSH: 1.267 u[IU]/mL (ref 0.350–4.500)

## 2023-07-13 LAB — COMPREHENSIVE METABOLIC PANEL
ALT: 13 U/L (ref 0–44)
AST: 24 U/L (ref 15–41)
Albumin: 2.4 g/dL — ABNORMAL LOW (ref 3.5–5.0)
Alkaline Phosphatase: 47 U/L (ref 38–126)
Anion gap: 10 (ref 5–15)
BUN: 14 mg/dL (ref 8–23)
CO2: 22 mmol/L (ref 22–32)
Calcium: 8.4 mg/dL — ABNORMAL LOW (ref 8.9–10.3)
Chloride: 99 mmol/L (ref 98–111)
Creatinine, Ser: 1.29 mg/dL — ABNORMAL HIGH (ref 0.61–1.24)
GFR, Estimated: 58 mL/min — ABNORMAL LOW (ref >60)
Glucose, Bld: 99 mg/dL (ref 70–99)
Potassium: 3.8 mmol/L (ref 3.5–5.1)
Sodium: 131 mmol/L — ABNORMAL LOW (ref 135–145)
Total Bilirubin: 0.9 mg/dL (ref 0.0–1.2)
Total Protein: 6.6 g/dL (ref 6.5–8.1)

## 2023-07-13 LAB — MAGNESIUM
Magnesium: 1.8 mg/dL (ref 1.7–2.4)
Magnesium: 1.7 mg/dL (ref 1.7–2.4)

## 2023-07-13 LAB — VITAMIN B12: Vitamin B-12: 236 pg/mL (ref 180–914)

## 2023-07-13 LAB — RAPID URINE DRUG SCREEN, HOSP PERFORMED
Amphetamines: NOT DETECTED
Barbiturates: POSITIVE — AB
Benzodiazepines: NOT DETECTED
Cocaine: NOT DETECTED
Opiates: NOT DETECTED
Tetrahydrocannabinol: NOT DETECTED

## 2023-07-13 LAB — CREATININE, URINE, RANDOM: Creatinine, Urine: 92 mg/dL

## 2023-07-13 LAB — SODIUM, URINE, RANDOM: Sodium, Ur: 23 mmol/L

## 2023-07-13 LAB — SALICYLATE LEVEL: Salicylate Lvl: <7 mg/dL — ABNORMAL LOW (ref 7.0–30.0)

## 2023-07-13 LAB — I-STAT CG4 LACTIC ACID, ED: Lactic Acid, Venous: 1 mmol/L (ref 0.5–1.9)

## 2023-07-13 LAB — PROCALCITONIN: Procalcitonin: 0.43 ng/mL

## 2023-07-13 LAB — OSMOLALITY: Osmolality: 291 mosm/kg (ref 275–295)

## 2023-07-13 MED ORDER — PANTOPRAZOLE SODIUM 40 MG PO TBEC
40.0000 mg | DELAYED_RELEASE_TABLET | Freq: Every day | ORAL | Status: DC
  Administered 2023-07-13 – 2023-07-18 (×6): 40 mg via ORAL
  Filled 2023-07-13 (×6): qty 1

## 2023-07-13 MED ORDER — VANCOMYCIN HCL 125 MG PO CAPS
125.0000 mg | ORAL_CAPSULE | Freq: Four times a day (QID) | ORAL | Status: DC
  Administered 2023-07-13 – 2023-07-19 (×23): 125 mg via ORAL
  Filled 2023-07-13 (×27): qty 1

## 2023-07-13 MED ORDER — MELATONIN 3 MG PO TABS
3.0000 mg | ORAL_TABLET | Freq: Every evening | ORAL | Status: DC | PRN
  Administered 2023-07-13 – 2023-07-18 (×3): 3 mg via ORAL
  Filled 2023-07-13 (×3): qty 1

## 2023-07-13 MED ORDER — MIDODRINE HCL 5 MG PO TABS
5.0000 mg | ORAL_TABLET | Freq: Three times a day (TID) | ORAL | Status: DC
  Administered 2023-07-15 – 2023-07-19 (×10): 5 mg via ORAL
  Filled 2023-07-13 (×16): qty 1

## 2023-07-13 MED ORDER — ONDANSETRON HCL 4 MG/2ML IJ SOLN: 4.0000 mg | Freq: Four times a day (QID) | INTRAMUSCULAR | Status: DC | PRN

## 2023-07-13 MED ORDER — DIVALPROEX SODIUM ER 500 MG PO TB24
500.0000 mg | ORAL_TABLET | Freq: Every day | ORAL | Status: DC
  Administered 2023-07-13 – 2023-07-18 (×6): 500 mg via ORAL
  Filled 2023-07-13 (×7): qty 1

## 2023-07-13 MED ORDER — ORAL CARE MOUTH RINSE: 15.0000 mL | OROMUCOSAL | Status: DC | PRN

## 2023-07-13 MED ORDER — ARIPIPRAZOLE 2 MG PO TABS
2.0000 mg | ORAL_TABLET | Freq: Every day | ORAL | Status: DC
  Administered 2023-07-13 – 2023-07-19 (×6): 2 mg via ORAL
  Filled 2023-07-13 (×7): qty 1

## 2023-07-13 MED ORDER — TAMSULOSIN HCL 0.4 MG PO CAPS
0.4000 mg | ORAL_CAPSULE | Freq: Every day | ORAL | Status: DC
  Administered 2023-07-13 – 2023-07-19 (×6): 0.4 mg via ORAL
  Filled 2023-07-13 (×7): qty 1

## 2023-07-13 MED ORDER — BUSPIRONE HCL 5 MG PO TABS
10.0000 mg | ORAL_TABLET | Freq: Three times a day (TID) | ORAL | Status: DC
  Administered 2023-07-13 – 2023-07-19 (×17): 10 mg via ORAL
  Filled 2023-07-13 (×19): qty 2

## 2023-07-13 MED ORDER — ACETAMINOPHEN 650 MG RE SUPP: 650.0000 mg | Freq: Four times a day (QID) | RECTAL | Status: DC | PRN

## 2023-07-13 MED ORDER — PRIMIDONE 50 MG PO TABS
50.0000 mg | ORAL_TABLET | Freq: Two times a day (BID) | ORAL | Status: DC
  Administered 2023-07-13 – 2023-07-19 (×12): 50 mg via ORAL
  Filled 2023-07-13 (×14): qty 1

## 2023-07-13 MED ORDER — PRAVASTATIN SODIUM 10 MG PO TABS
20.0000 mg | ORAL_TABLET | Freq: Every day | ORAL | Status: DC
  Administered 2023-07-13 – 2023-07-18 (×6): 20 mg via ORAL
  Filled 2023-07-13 (×6): qty 2

## 2023-07-13 MED ORDER — VENLAFAXINE HCL ER 75 MG PO CP24
300.0000 mg | ORAL_CAPSULE | Freq: Every day | ORAL | Status: DC
  Administered 2023-07-13 – 2023-07-19 (×6): 300 mg via ORAL
  Filled 2023-07-13 (×7): qty 4

## 2023-07-13 MED ORDER — ACETAMINOPHEN 325 MG PO TABS
650.0000 mg | ORAL_TABLET | Freq: Four times a day (QID) | ORAL | Status: DC | PRN
Start: 2023-07-13 — End: 2023-07-19
  Administered 2023-07-13 – 2023-07-14 (×3): 650 mg via ORAL
  Filled 2023-07-13 (×3): qty 2

## 2023-07-13 MED ORDER — CHLORHEXIDINE GLUCONATE CLOTH 2 % EX PADS
6.0000 | MEDICATED_PAD | Freq: Every day | CUTANEOUS | Status: DC
  Administered 2023-07-13 – 2023-07-19 (×7): 6 via TOPICAL

## 2023-07-13 MED ORDER — GERHARDT'S BUTT CREAM
TOPICAL_CREAM | Freq: Three times a day (TID) | CUTANEOUS | Status: DC
  Filled 2023-07-13 (×2): qty 60

## 2023-07-13 NOTE — Evaluation (Signed)
 Occupational Therapy Evaluation Patient Details Name: Calvin Escobar MRN: [id number] DOB: 1951-05-12 Today's Date: 07/13/2023   History of Present Illness Pt is a 73 y/o M presenting to ED on 2/7 with AMS, admitted for acute metabolic encephalopathy from AKI, sepsis from possible UTI. Recent admission for pseudomonas UTI presenting with dysuria/penile pain. PMH includes dementia, HLD, depression, GERD, BPH, anemia of chronic disease associated hgb 7   Clinical Impression   Pt reports ind without AD for mobility and ADLs, lives with spouse who assists with his medications. Pt currently needing min-max A for ADLs, min A for bed mobility and mod A for transfers with RW. Pt incontinent of BM upon standing, pt leaning posteriorly, needing mod cues for safety as pt attempting to stand from [facility] before having assist. Pt presenting with impairments listed below, will follow acutely. Patient will benefit from continued inpatient follow up therapy, <3 hours/day to maximize safety/ind with ADL/functional mobility.       If plan is discharge home, recommend the following: A lot of help with walking and/or transfers;A lot of help with bathing/dressing/bathroom;Assistance with cooking/housework;Direct supervision/assist for medications management;Assist for transportation;Direct supervision/assist for financial management;Help with stairs or ramp for entrance    Functional Status Assessment  Patient has had a recent decline in their functional status and demonstrates the ability to make significant improvements in function in a reasonable and predictable amount of time.  Equipment Recommendations  Other (comment) (defer)    Recommendations for Other Services PT consult     Precautions / Restrictions Precautions Precautions: Fall Restrictions Weight Bearing Restrictions Per Provider Order: No      Mobility Bed Mobility Overal bed mobility: Needs Assistance Bed Mobility: Supine to Sit      Supine to sit: Min assist          Transfers Overall transfer level: Needs assistance Equipment used: Rolling walker (2 wheels) Transfers: Sit to/from Stand Sit to Stand: Mod assist           General transfer comment: pt with posterior lean and cues to push from bed vs pull from RW      Balance Overall balance assessment: Needs assistance Sitting-balance support: No upper extremity supported, Feet supported Sitting balance-Leahy Scale: Good     Standing balance support: During functional activity, Bilateral upper extremity supported, Reliant on assistive device for balance Standing balance-Leahy Scale: Poor Standing balance comment: reliant on RW support, post lean                           ADL either performed or assessed with clinical judgement   ADL Overall ADL's : Needs assistance/impaired Eating/Feeding: Sitting;Set up   Grooming: Contact guard assist;Standing   Upper Body Bathing: Minimal assistance   Lower Body Bathing: Moderate assistance   Upper Body Dressing : Minimal assistance   Lower Body Dressing: Moderate assistance   Toilet Transfer: Moderate assistance   Toileting- Clothing Manipulation and Hygiene: Maximal assistance       Functional mobility during ADLs: Moderate assistance;Rolling walker (2 wheels)       Vision   Vision Assessment?: No apparent visual deficits     Perception Perception: Not tested       Praxis Praxis: Not tested       Pertinent Vitals/Pain Pain Assessment Pain Assessment: No/denies pain     Extremity/Trunk Assessment Upper Extremity Assessment Upper Extremity Assessment: Generalized weakness   Lower Extremity Assessment Lower Extremity Assessment: Defer to PT evaluation  Cervical / Trunk Assessment Cervical / Trunk Assessment: Kyphotic   Communication Communication Communication: No apparent difficulties   Cognition Arousal: Alert Behavior During Therapy: WFL for tasks  assessed/performed, Impulsive Overall Cognitive Status: No family/caregiver present to determine baseline cognitive functioning                                 General Comments: hx of dementia, incontinent, impulsive with movement     General Comments  VSS    Exercises     Shoulder Instructions      Home Living Family/patient expects to be discharged to:: Private residence Living Arrangements: Spouse/significant other Available Help at Discharge: Family;Available PRN/intermittently Type of Home: House Home Access: Stairs to enter [employer] of Steps: 8-9 Entrance Stairs-Rails: None Home Layout: Two level;Bed/bath upstairs Alternate Level Stairs-Number of Steps: flight Alternate Level Stairs-Rails: Right;Left Bathroom Shower/Tub: Runner, Broadcasting/film/video: None          Prior Functioning/Environment               Mobility Comments: ind without AD ADLs Comments: ind, spouse assists with meds        OT Problem List: Decreased activity tolerance;Impaired balance (sitting and/or standing);Decreased safety awareness;Decreased knowledge of use of DME or AE;Cardiopulmonary status limiting activity;Pain      OT Treatment/Interventions: Self-care/ADL training;DME and/or AE instruction;Therapeutic activities;Patient/family education;Balance training    OT Goals(Current goals can be found in the care plan section) Acute Rehab OT Goals Patient Stated Goal: none stated OT Goal Formulation: With patient Time For Goal Achievement: 07/27/23 Potential to Achieve Goals: Good ADL Goals Pt Will Perform Upper Body Dressing: with supervision;sitting Pt Will Perform Lower Body Dressing: with supervision;sit to/from stand;sitting/lateral leans Pt Will Transfer to Toilet: with supervision;ambulating;regular height toilet Additional ADL Goal #1: pt will perform bed mobility with supervision in prep for ADLs  OT Frequency: Min 1X/week     Co-evaluation              AM-PAC OT 6 Clicks Daily Activity     Outcome Measure Help from another person eating meals?: A Little Help from another person taking care of personal grooming?: A Little Help from another person toileting, which includes using toliet, bedpan, or urinal?: A Little Help from another person bathing (including washing, rinsing, drying)?: A Lot Help from another person to put on and taking off regular upper body clothing?: A Little Help from another person to put on and taking off regular lower body clothing?: A Lot 6 Click Score: 16   End of Session Equipment Utilized During Treatment: Gait belt;Rolling walker (2 wheels) Nurse Communication: Mobility status  Activity Tolerance: Patient tolerated treatment well Patient left: in bed;with call bell/phone within reach;with bed alarm set;with nursing/sitter in room  OT Visit Diagnosis: Unsteadiness on feet (R26.81);Other abnormalities of gait and mobility (R26.89);Repeated falls (R29.6);Muscle weakness (generalized) (M62.81);Pain                Time: 1400-1433 OT Time Calculation (min): 33 min Charges:  OT General Charges $OT Visit: 1 Visit OT Evaluation $OT Eval Moderate Complexity: 1 Mod OT Treatments $Self Care/Home Management : 8-22 mins  Jerzy Crotteau K, OTD, OTR/L SecureChat Preferred Acute Rehab [phone]   Laneta POUR Koonce 07/13/2023, 3:25 PM

## 2023-07-13 NOTE — Progress Notes (Signed)
 [facility]                                                                               [name], is a 73 y.o. male, DOB - 1951/03/02, FMW:[id number] Admit date - 07/12/2023    Outpatient Primary MD for the patient is [name], MD  LOS - 1  days    Brief summary   Calvin Escobar is a 73 y.o. male with medical history significant for dementia, hyperlipidemia, depression, GERD, BPH, anemia of chronic disease associated baseline hemoglobin 7, who is admitted to [facility] on 07/12/2023 with suspected acute metabolic encephalopathy after presenting from home to [facility] ED for evaluation of altered mental status.    Assessment & Plan    Assessment and Plan:  Acute metabolic encephalopathy from from AKI, and dehydration.  Creatinine improved from 1.4 to 1.2. Pt is more alert and awake and oriented to place and person.    Sepsis from possible UTI:  Pt febrile of 101.6, tachycardic, tachypnea of 23/min and with [place] [address], UA suggestive of possible UTI.  Ua with large leukocytes and few bacteria.  Follow up urine and blood cultures.  Continue with IV zosyn  until final urine culture report.  Follow up wbc count.  Procalcitonin of 0.43  Dementia  No agitation.    GERD Stable.    Anemia of chronic disease/ iron  deficiency anemia Hemoglobin at baseline around 8.  Iron  supplementation on discharge.    Hyponatremia: possibly from hypovolemia.  Sodium stable around 131.     BPH Continue with flomax .    Hyperlipidemia Continue with pravastatin .    Estimated body mass index is 29.64 kg/m as calculated from the following:   Height as of this encounter: 6' 1 (1.854 m).   Weight as of this encounter: 101.9 kg.  Code Status: DNR limited.  DVT Prophylaxis:  SCDs Start: 07/13/23 0008   Level of Care: Level of care: Telemetry Medical Family Communication: none at bedside.   Disposition Plan:     Remains inpatient  appropriate:  pending urine cultures.   Procedures:  None.   Consultants:   None.   Antimicrobials:   Anti-infectives (From admission, onward)    Start     Dose/Rate Route Frequency Ordered Stop   07/12/23 2330  ceFEPIme  (MAXIPIME ) 2 g in sodium chloride  0.9 % 100 mL IVPB        2 g 200 mL/hr over 30 Minutes Intravenous Every 24 hours 07/12/23 2326     07/12/23 2315  levofloxacin  (LEVAQUIN ) IVPB 750 mg  Status:  Discontinued        750 mg 100 mL/hr over 90 Minutes Intravenous  Once 07/12/23 2313 07/12/23 2326   07/12/23 2145  cefTRIAXone  (ROCEPHIN ) 1 g in sodium chloride  0.9 % 100 mL IVPB        1 g 200 mL/hr over 30 Minutes Intravenous  Once 07/12/23 2133 07/12/23 2331        Medications  Scheduled Meds:  ARIPiprazole   2 mg Oral Daily   busPIRone   10 mg Oral TID   divalproex   500 mg Oral QHS   Gerhardt's butt cream  Topical TID   midodrine   5 mg Oral TID WC   pantoprazole   40 mg Oral QHS   pravastatin   20 mg Oral QHS   primidone   50 mg Oral BID   tamsulosin   0.4 mg Oral Daily   venlafaxine  XR  300 mg Oral Q breakfast   Continuous Infusions:  ceFEPime  (MAXIPIME ) IV Stopped (07/13/23 0057)   PRN Meds:.acetaminophen  **OR** acetaminophen , melatonin, ondansetron  (ZOFRAN ) IV, mouth rinse    Subjective:   Calvin Escobar was seen and examined today.  Pt alert and oriented to place and person.  Objective:   Vitals:   07/13/23 0139 07/13/23 0143 07/13/23 0555 07/13/23 0918  BP:  115/61 123/76 (!) 144/75  Pulse:  98 97 (!) 107  Resp:  18 18   Temp:  98.6 F (37 C) 98.2 F (36.8 C) 99.7 F (37.6 C)  TempSrc:  Oral Oral Oral  SpO2:  99% 94% 93%  Weight: 101.9 kg     Height: 6' 1 (1.854 m)       Intake/Output Summary (Last 24 hours) at 07/13/2023 1102 Last data filed at 07/13/2023 1050 Gross per 24 hour  Intake 2146.67 ml  Output 250 ml  Net 1896.67 ml   Filed Weights   07/12/23 2014 07/13/23 0139  Weight: 101.6 kg 101.9 kg     Exam General exam:  Appears calm and comfortable  Respiratory system: Clear to auscultation. Respiratory effort normal. Cardiovascular system: S1 & S2 heard, RRR.  Gastrointestinal system: Abdomen is nondistended, soft and nontender. Central nervous system: Alert and oriented. No focal neurological deficits. Extremities: Symmetric 5 x 5 power. Skin: No rashes, Psychiatry:  Mood & affect appropriate.     Data Reviewed:  I have personally reviewed following labs and imaging studies   CBC Lab Results  Component Value Date   WBC 13.9 (H) 07/13/2023   RBC 3.24 (L) 07/13/2023   HGB 8.8 (L) 07/13/2023   HCT 27.6 (L) 07/13/2023   MCV 85.2 07/13/2023   MCH 27.2 07/13/2023   PLT 228 07/13/2023   MCHC 31.9 07/13/2023   RDW 16.0 (H) 07/13/2023   LYMPHSABS 1.0 07/13/2023   MONOABS 1.3 (H) 07/13/2023   EOSABS 0.3 07/13/2023   BASOSABS 0.1 07/13/2023     Last metabolic panel Lab Results  Component Value Date   NA 131 (L) 07/13/2023   K 3.8 07/13/2023   CL 99 07/13/2023   CO2 22 07/13/2023   BUN 14 07/13/2023   CREATININE 1.29 (H) 07/13/2023   GLUCOSE 99 07/13/2023   GFRNONAA 58 (L) 07/13/2023   GFRAA 67 11/13/2019   CALCIUM  8.4 (L) 07/13/2023   PHOS 3.9 07/05/2023   PROT 6.6 07/13/2023   ALBUMIN  2.4 (L) 07/13/2023   LABGLOB 2.8 10/28/2019   AGRATIO 1.5 10/28/2019   BILITOT 0.9 07/13/2023   ALKPHOS 47 07/13/2023   AST 24 07/13/2023   ALT 13 07/13/2023   ANIONGAP 10 07/13/2023    CBG (last 3)  No results for input(s): GLUCAP in the last 72 hours.    Coagulation Profile: No results for input(s): INR, PROTIME in the last 168 hours.   Radiology Studies: DG Chest 1 View Result Date: 07/12/2023 CLINICAL DATA:  Altered mental status.  Penis pain. EXAM: CHEST  1 VIEW COMPARISON:  07/01/2023. FINDINGS: The heart size and mediastinal contours are within normal limits. No consolidation, effusion, or pneumothorax is seen. There is a stable calcified granuloma in the mid right lung. No acute  osseous abnormality. IMPRESSION: No active disease.  Electronically Signed   By: Leita Birmingham M.D.   On: 07/12/2023 20:52       Elgie Butter M.D. [facility] 07/13/2023, 11:02 AM  Available via Epic secure chat 7am-7pm After 7 pm, please refer to night coverage provider listed on amion.

## 2023-07-13 NOTE — ED Notes (Signed)
 While this RN was in a different room, this patient attempted to get out of bed, removing his IV in the process. Pt had also defecated on himself. This RN and nurse tech cleaned patient at this time.

## 2023-07-13 NOTE — Progress Notes (Signed)
 New Admission Note:   Arrival Method:  Via stretcher at bedside Mental Orientation:  A & O to person/place.  Disoriented to time/situation Telemetry: Box 5M14 - NSR/BBB Assessment: Completed Skin:  Abrasion to L Knee, Callus to Rt Medial Foot, and MASD to groin/sacrum IV:  Rt Hand/Lt Hand Pain: Denies Tubes:  Chronic Foley Safety Measures: Unable to sign d/t confusion - Safety Sitter and Bed Alarm on Admission: Completed 5 MW Orientation: Patient has been orientated to the room, unit and staff.  Family:  No Family at bedside  Patient does not have dentures.  He does have his glasses and clothing at the bedside.  No Cell phone.  Orders have been reviewed and implemented. Will continue to monitor the patient. Call light has been placed within reach and bed alarm has been activated.   Suzen Ice RN Phone number: [phone]

## 2023-07-14 DIAGNOSIS — E871 Hypo-osmolality and hyponatremia: Secondary | ICD-10-CM | POA: Diagnosis not present

## 2023-07-14 DIAGNOSIS — G9341 Metabolic encephalopathy: Secondary | ICD-10-CM | POA: Diagnosis not present

## 2023-07-14 DIAGNOSIS — N3000 Acute cystitis without hematuria: Secondary | ICD-10-CM | POA: Diagnosis not present

## 2023-07-14 DIAGNOSIS — K219 Gastro-esophageal reflux disease without esophagitis: Secondary | ICD-10-CM | POA: Diagnosis not present

## 2023-07-14 LAB — BLOOD GAS, VENOUS
Acid-Base Excess: 0.8 mmol/L (ref 0.0–2.0)
Bicarbonate: 25.3 mmol/L (ref 20.0–28.0)
O2 Saturation: 71.6 %
Patient temperature: 36.7
pCO2, Ven: 39 mm[Hg] — ABNORMAL LOW (ref 44–60)
pH, Ven: 7.42 (ref 7.25–7.43)
pO2, Ven: 40 mm[Hg] (ref 32–45)

## 2023-07-14 LAB — CBC WITH DIFFERENTIAL/PLATELET
Abs Immature Granulocytes: 0.08 10*3/uL — ABNORMAL HIGH (ref 0.00–0.07)
Basophils Absolute: 0.2 10*3/uL — ABNORMAL HIGH (ref 0.0–0.1)
Basophils Relative: 1 %
Eosinophils Absolute: 0.4 10*3/uL (ref 0.0–0.5)
Eosinophils Relative: 3 %
HCT: 25.8 % — ABNORMAL LOW (ref 39.0–52.0)
Hemoglobin: 8.2 g/dL — ABNORMAL LOW (ref 13.0–17.0)
Immature Granulocytes: 1 %
Lymphocytes Relative: 9 %
Lymphs Abs: 1.1 10*3/uL (ref 0.7–4.0)
MCH: 26.9 pg (ref 26.0–34.0)
MCHC: 31.8 g/dL (ref 30.0–36.0)
MCV: 84.6 fL (ref 80.0–100.0)
Monocytes Absolute: 1.2 10*3/uL — ABNORMAL HIGH (ref 0.1–1.0)
Monocytes Relative: 10 %
Neutro Abs: 9.6 10*3/uL — ABNORMAL HIGH (ref 1.7–7.7)
Neutrophils Relative %: 76 %
Platelets: 207 10*3/uL (ref 150–400)
RBC: 3.05 MIL/uL — ABNORMAL LOW (ref 4.22–5.81)
RDW: 15.9 % — ABNORMAL HIGH (ref 11.5–15.5)
WBC: 12.5 10*3/uL — ABNORMAL HIGH (ref 4.0–10.5)
nRBC: 0 % (ref 0.0–0.2)

## 2023-07-14 LAB — BASIC METABOLIC PANEL
Anion gap: 11 (ref 5–15)
BUN: 11 mg/dL (ref 8–23)
CO2: 23 mmol/L (ref 22–32)
Calcium: 8.1 mg/dL — ABNORMAL LOW (ref 8.9–10.3)
Chloride: 98 mmol/L (ref 98–111)
Creatinine, Ser: 1.02 mg/dL (ref 0.61–1.24)
GFR, Estimated: >60 mL/min (ref >60)
Glucose, Bld: 99 mg/dL (ref 70–99)
Potassium: 3.4 mmol/L — ABNORMAL LOW (ref 3.5–5.1)
Sodium: 132 mmol/L — ABNORMAL LOW (ref 135–145)

## 2023-07-14 LAB — PROTIME-INR
INR: 1.3 — ABNORMAL HIGH (ref 0.8–1.2)
Prothrombin Time: 16.1 s — ABNORMAL HIGH (ref 11.4–15.2)

## 2023-07-14 LAB — AMMONIA: Ammonia: 19 umol/L (ref 9–35)

## 2023-07-14 LAB — URIC ACID: Uric Acid, Serum: 9.6 mg/dL — ABNORMAL HIGH (ref 3.7–8.6)

## 2023-07-14 MED ORDER — MAGNESIUM SULFATE 2 GM/50ML IV SOLN
2.0000 g | Freq: Once | INTRAVENOUS | Status: AC
  Administered 2023-07-14: 2 g via INTRAVENOUS
  Filled 2023-07-14: qty 50

## 2023-07-14 MED ORDER — POTASSIUM CHLORIDE CRYS ER 20 MEQ PO TBCR
40.0000 meq | EXTENDED_RELEASE_TABLET | Freq: Two times a day (BID) | ORAL | Status: AC
  Administered 2023-07-14 (×2): 40 meq via ORAL
  Filled 2023-07-14 (×2): qty 2

## 2023-07-14 NOTE — Evaluation (Signed)
 Physical Therapy Evaluation Patient Details Name: Eligio Angert MRN: [id number] DOB: January 26, 1951 Today's Date: 07/14/2023  History of Present Illness  Pt is a 73 y/o M presenting to ED on 2/7 with AMS, admitted for acute metabolic encephalopathy from AKI, sepsis from possible UTI. Recent admission for pseudomonas UTI presenting with dysuria/penile pain. PMH includes dementia, HLD, depression, GERD, BPH, anemia of chronic disease associated hgb 7  Clinical Impression   Pt admitted with above diagnosis. Lives at home with his wife, in a 2-level home with 8-9 steps to enter; Prior to admission, pt was able to walk without an assistive device; wife helps with meds; Presents to PT with generalized weakness, impaired balance, cognitive deficits, impulsivity, incontinence;  needs min/mod assist to stand and take a few steps to recliner placed beside bed; Pt isn't oriented, and needed very close guard assist when helping with toileting (at times, he reached his bare hand back and soiled it with stool; In considering options for discharge, I value going back to familiar environment, caregivers, and routines for patients with dementia; still, it looks like he is needing MUCH more assist with mobility and ADLs than this most recent admission; Will consider SNF for rehab, and monitor progress; Pt currently with functional limitations due to the deficits listed below (see PT Problem List). Pt will benefit from skilled PT to increase their independence and safety with mobility to allow discharge to the venue listed below.           If plan is discharge home, recommend the following: A little help with walking and/or transfers;A little help with bathing/dressing/bathroom;Assistance with cooking/housework;Direct supervision/assist for medications management;Assist for transportation;Help with stairs or ramp for entrance   Can travel by private vehicle   No    Equipment Recommendations Rolling walker (2 wheels)   Recommendations for Other Services       Functional Status Assessment Patient has had a recent decline in their functional status and demonstrates the ability to make significant improvements in function in a reasonable and predictable amount of time.     Precautions / Restrictions Precautions Precautions: Fall Restrictions Weight Bearing Restrictions Per Provider Order: No      Mobility  Bed Mobility Overal bed mobility: Needs Assistance Bed Mobility: Supine to Sit     Supine to sit: Min assist     General bed mobility comments: Cues fro safety    Transfers Overall transfer level: Needs assistance Equipment used: Rolling walker (2 wheels) Transfers: Sit to/from Stand, Bed to chair/wheelchair/BSC Sit to Stand: Mod assist   Step pivot transfers: Min assist       General transfer comment: pt with posterior lean and cues to push from bed vs pull from RW; multimodal cues to turn for recliner, and to keep both hands on RW until safe to sit    Ambulation/Gait                  Stairs            Wheelchair Mobility     Tilt Bed    Modified Rankin (Stroke Patients Only)       Balance Overall balance assessment: Needs assistance Sitting-balance support: No upper extremity supported, Feet supported Sitting balance-Leahy Scale: Good     Standing balance support: During functional activity, Bilateral upper extremity supported, Reliant on assistive device for balance Standing balance-Leahy Scale: Poor Standing balance comment: reliant on RW support, post lean  Pertinent Vitals/Pain Pain Assessment Pain Assessment: No/denies pain    Home Living Family/patient expects to be discharged to:: Private residence Living Arrangements: Spouse/significant other Available Help at Discharge: Family;Available PRN/intermittently Type of Home: House Home Access: Stairs to enter Entrance Stairs-Rails: None Entrance  Stairs-Number of Steps: 8-9 Alternate Level Stairs-Number of Steps: flight Home Layout: Two level;Bed/bath upstairs Home Equipment: None      Prior Function               Mobility Comments: ind without AD ADLs Comments: ind, spouse assists with meds     Extremity/Trunk Assessment   Upper Extremity Assessment Upper Extremity Assessment: Defer to OT evaluation    Lower Extremity Assessment Lower Extremity Assessment: Generalized weakness    Cervical / Trunk Assessment Cervical / Trunk Assessment: Kyphotic  Communication   Communication Communication: No apparent difficulties  Cognition Arousal: Alert Behavior During Therapy: WFL for tasks assessed/performed, Impulsive Overall Cognitive Status: No family/caregiver present to determine baseline cognitive functioning                                 General Comments: hx of dementia, incontinent, impulsive with movement        General Comments General comments (skin integrity, edema, etc.): NAD on room air; assisted in peri-hygeine and getting a clean gown    Exercises     Assessment/Plan    PT Assessment Patient needs continued PT services  PT Problem List Decreased strength;Decreased balance;Decreased mobility;Decreased cognition;Decreased safety awareness       PT Treatment Interventions DME instruction;Gait training;Stair training;Functional mobility training;Therapeutic activities;Therapeutic exercise;Balance training;Patient/family education    PT Goals (Current goals can be found in the Care Plan section)  Acute Rehab PT Goals Patient Stated Goal: Wants to get OOB PT Goal Formulation: With patient Time For Goal Achievement: 07/28/23 Potential to Achieve Goals: Good    Frequency Min 1X/week     Co-evaluation               AM-PAC PT 6 Clicks Mobility  Outcome Measure Help needed turning from your back to your side while in a flat bed without using bedrails?: None Help needed  moving from lying on your back to sitting on the side of a flat bed without using bedrails?: A Little Help needed moving to and from a bed to a chair (including a wheelchair)?: A Little Help needed standing up from a chair using your arms (e.g., wheelchair or bedside chair)?: A Little Help needed to walk in hospital room?: A Lot Help needed climbing 3-5 steps with a railing? : A Lot 6 Click Score: 17    End of Session Equipment Utilized During Treatment: Gait belt Activity Tolerance: Patient tolerated treatment well Patient left: in chair;with call bell/phone within reach;with chair alarm set Nurse Communication: Mobility status PT Visit Diagnosis: Other abnormalities of gait and mobility (R26.89);Muscle weakness (generalized) (M62.81)    Time: 9067-9051 PT Time Calculation (min) (ACUTE ONLY): 16 min   Charges:   PT Evaluation $PT Eval Moderate Complexity: 1 Mod   PT General Charges $$ ACUTE PT VISIT: 1 Visit         Silvano Currier, PT  Acute Rehabilitation Services Office [phone] Secure Chat welcomed   Silvano VEAR Currier 07/14/2023, 12:03 PM

## 2023-07-14 NOTE — Progress Notes (Signed)
 [facility]                                                                               Irwin Toran, is a 73 y.o. male, DOB - 1950-09-01, FMW:[id number] Admit date - 07/12/2023    Outpatient Primary MD for the patient is [name], MD  LOS - 2  days    Brief summary   Calvin Escobar is a 73 y.o. male with medical history significant for dementia, hyperlipidemia, depression, GERD, BPH, anemia of chronic disease associated baseline hemoglobin 7, who is admitted to [facility] on 07/12/2023 with suspected acute metabolic encephalopathy after presenting from home to [facility] ED for evaluation of altered mental status.    Assessment & Plan    Assessment and Plan:  Acute metabolic encephalopathy from from AKI, and dehydration.  Creatinine improved from 1.4 to 1.2 to 1.02 Pt is more alert and awake and oriented to place and person.    Sepsis from possible UTI:  Pt febrile of 101.6, tachycardic, tachypnea of 23/min and [place] [address], UA suggestive of possible UTI.  Ua with large leukocytes and few bacteria.  Follow up urine and blood cultures.  Continue with IV zosyn  until final urine culture report.  Leukocytosis improving.  Procalcitonin of 0.43  Dementia  No agitation.    GERD Stable.    Anemia of chronic disease/ iron  deficiency anemia Hemoglobin at baseline around 8.  Iron  supplementation on discharge.    Hyponatremia: possibly from hypovolemia.  Sodium stable around 132.     BPH Continue with flomax .    Hyperlipidemia Continue with pravastatin .   C diff diarrhea Started the patient on oral vancomycin .   AKI Resolved.    Therapy eval recommending SNF.   Estimated body mass index is 29.64 kg/m as calculated from the following:   Height as of this encounter: 6' 1 (1.854 m).   Weight as of this encounter: 101.9 kg.  Code Status: DNR limited.  DVT Prophylaxis:  SCDs Start: 07/13/23 0008   Level of Care:  Level of care: Telemetry Medical Family Communication: none at bedside.   Disposition Plan:     Remains inpatient appropriate:  pending urine cultures.   Procedures:  None.   Consultants:   None.   Antimicrobials:   Anti-infectives (From admission, onward)    Start     Dose/Rate Route Frequency Ordered Stop   07/13/23 1400  vancomycin  (VANCOCIN ) capsule 125 mg        125 mg Oral 4 times daily 07/13/23 1139 07/23/23 1359   07/12/23 2330  ceFEPIme  (MAXIPIME ) 2 g in sodium chloride  0.9 % 100 mL IVPB        2 g 200 mL/hr over 30 Minutes Intravenous Every 24 hours 07/12/23 2326     07/12/23 2315  levofloxacin  (LEVAQUIN ) IVPB 750 mg  Status:  Discontinued        750 mg 100 mL/hr over 90 Minutes Intravenous  Once 07/12/23 2313 07/12/23 2326   07/12/23 2145  cefTRIAXone  (ROCEPHIN ) 1 g in sodium chloride  0.9 % 100 mL IVPB        1 g 200 mL/hr over 30 Minutes  Intravenous  Once 07/12/23 2133 07/12/23 2331        Medications  Scheduled Meds:  ARIPiprazole   2 mg Oral Daily   busPIRone   10 mg Oral TID   Chlorhexidine  Gluconate Cloth  6 each Topical Daily   divalproex   500 mg Oral QHS   Gerhardt's butt cream   Topical TID   midodrine   5 mg Oral TID WC   pantoprazole   40 mg Oral QHS   potassium chloride   40 mEq Oral BID   pravastatin   20 mg Oral QHS   primidone   50 mg Oral BID   tamsulosin   0.4 mg Oral Daily   vancomycin   125 mg Oral QID   venlafaxine  XR  300 mg Oral Q breakfast   Continuous Infusions:  ceFEPime  (MAXIPIME ) IV 2 g (07/14/23 0049)   PRN Meds:.acetaminophen  **OR** acetaminophen , melatonin, ondansetron  (ZOFRAN ) IV, mouth rinse    Subjective:   Calvin Escobar was seen and examined today.  FEELING BETTER.  Objective:   Vitals:   07/14/23 0454 07/14/23 0715 07/14/23 1247 07/14/23 1616  BP: 136/84 (!) 144/82 (!) 124/98 (!) 139/90  Pulse: 98 93 (!) 109 (!) 109  Resp: 19 18 18 18   Temp: 98.3 F (36.8 C)   98.4 F (36.9 C)  TempSrc: Oral     SpO2: 96% 95% 97%    Weight:      Height:        Intake/Output Summary (Last 24 hours) at 07/14/2023 1740 Last data filed at 07/14/2023 1454 Gross per 24 hour  Intake 1750 ml  Output 2650 ml  Net -900 ml   Filed Weights   07/12/23 2014 07/13/23 0139  Weight: 101.6 kg 101.9 kg     Exam General exam: Appears calm and comfortable  Respiratory system: Clear to auscultation. Respiratory effort normal. Cardiovascular system: S1 & S2 heard, RRR.  Gastrointestinal system: Abdomen is nondistended, soft and nontender. Central nervous system: Alert and oriented to place and person. Extremities: Symmetric 5 x 5 power. Skin: No rashes,  Psychiatry: Mood & affect appropriate.      Data Reviewed:  I have personally reviewed following labs and imaging studies   CBC Lab Results  Component Value Date   WBC 12.5 (H) 07/14/2023   RBC 3.05 (L) 07/14/2023   HGB 8.2 (L) 07/14/2023   HCT 25.8 (L) 07/14/2023   MCV 84.6 07/14/2023   MCH 26.9 07/14/2023   PLT 207 07/14/2023   MCHC 31.8 07/14/2023   RDW 15.9 (H) 07/14/2023   LYMPHSABS 1.1 07/14/2023   MONOABS 1.2 (H) 07/14/2023   EOSABS 0.4 07/14/2023   BASOSABS 0.2 (H) 07/14/2023     Last metabolic panel Lab Results  Component Value Date   NA 132 (L) 07/14/2023   K 3.4 (L) 07/14/2023   CL 98 07/14/2023   CO2 23 07/14/2023   BUN 11 07/14/2023   CREATININE 1.02 07/14/2023   GLUCOSE 99 07/14/2023   GFRNONAA >60 07/14/2023   GFRAA 67 11/13/2019   CALCIUM  8.1 (L) 07/14/2023   PHOS 3.9 07/05/2023   PROT 6.6 07/13/2023   ALBUMIN  2.4 (L) 07/13/2023   LABGLOB 2.8 10/28/2019   AGRATIO 1.5 10/28/2019   BILITOT 0.9 07/13/2023   ALKPHOS 47 07/13/2023   AST 24 07/13/2023   ALT 13 07/13/2023   ANIONGAP 11 07/14/2023    CBG (last 3)  No results for input(s): GLUCAP in the last 72 hours.    Coagulation Profile: Recent Labs  Lab 07/14/23 0217  INR 1.3*  Radiology Studies: DG Chest 1 View Result Date: 07/12/2023 CLINICAL DATA:  Altered  mental status.  Penis pain. EXAM: CHEST  1 VIEW COMPARISON:  07/01/2023. FINDINGS: The heart size and mediastinal contours are within normal limits. No consolidation, effusion, or pneumothorax is seen. There is a stable calcified granuloma in the mid right lung. No acute osseous abnormality. IMPRESSION: No active disease. Electronically Signed   By: Leita Birmingham M.D.   On: 07/12/2023 20:52       Elgie Butter M.D. [facility] 07/14/2023, 5:40 PM  Available via Epic secure chat 7am-7pm After 7 pm, please refer to night coverage provider listed on amion.

## 2023-07-15 ENCOUNTER — Telehealth (HOSPITAL_COMMUNITY): Payer: Self-pay

## 2023-07-15 ENCOUNTER — Other Ambulatory Visit (HOSPITAL_COMMUNITY): Payer: Self-pay

## 2023-07-15 DIAGNOSIS — N3000 Acute cystitis without hematuria: Secondary | ICD-10-CM | POA: Diagnosis not present

## 2023-07-15 DIAGNOSIS — K219 Gastro-esophageal reflux disease without esophagitis: Secondary | ICD-10-CM | POA: Diagnosis not present

## 2023-07-15 DIAGNOSIS — G9341 Metabolic encephalopathy: Secondary | ICD-10-CM | POA: Diagnosis not present

## 2023-07-15 DIAGNOSIS — E871 Hypo-osmolality and hyponatremia: Secondary | ICD-10-CM | POA: Diagnosis not present

## 2023-07-15 LAB — COMPREHENSIVE METABOLIC PANEL
ALT: 12 U/L (ref 0–44)
AST: 24 U/L (ref 15–41)
Albumin: 2.4 g/dL — ABNORMAL LOW (ref 3.5–5.0)
Alkaline Phosphatase: 52 U/L (ref 38–126)
Anion gap: 16 — ABNORMAL HIGH (ref 5–15)
BUN: 8 mg/dL (ref 8–23)
CO2: 19 mmol/L — ABNORMAL LOW (ref 22–32)
Calcium: 8.7 mg/dL — ABNORMAL LOW (ref 8.9–10.3)
Chloride: 97 mmol/L — ABNORMAL LOW (ref 98–111)
Creatinine, Ser: 0.96 mg/dL (ref 0.61–1.24)
GFR, Estimated: >60 mL/min (ref >60)
Glucose, Bld: 98 mg/dL (ref 70–99)
Potassium: 3.9 mmol/L (ref 3.5–5.1)
Sodium: 132 mmol/L — ABNORMAL LOW (ref 135–145)
Total Bilirubin: 0.3 mg/dL (ref 0.0–1.2)
Total Protein: 6.6 g/dL (ref 6.5–8.1)

## 2023-07-15 LAB — GLUCOSE, CAPILLARY: Glucose-Capillary: 97 mg/dL (ref 70–99)

## 2023-07-15 MED ORDER — VITAMIN B-12 100 MCG PO TABS
500.0000 ug | ORAL_TABLET | Freq: Every day | ORAL | Status: DC
  Administered 2023-07-16 – 2023-07-19 (×3): 500 ug via ORAL
  Filled 2023-07-15 (×4): qty 5

## 2023-07-15 MED ORDER — CYANOCOBALAMIN 1000 MCG/ML IJ SOLN
1000.0000 ug | Freq: Once | INTRAMUSCULAR | Status: AC
  Administered 2023-07-15: 1000 ug via INTRAMUSCULAR
  Filled 2023-07-15: qty 1

## 2023-07-15 MED ORDER — MIRTAZAPINE 15 MG PO TABS
30.0000 mg | ORAL_TABLET | Freq: Every day | ORAL | Status: DC
  Administered 2023-07-15 – 2023-07-18 (×4): 30 mg via ORAL
  Filled 2023-07-15 (×4): qty 2

## 2023-07-15 MED ORDER — TRAZODONE HCL 50 MG PO TABS
300.0000 mg | ORAL_TABLET | Freq: Every day | ORAL | Status: DC
  Administered 2023-07-15 – 2023-07-16 (×2): 300 mg via ORAL
  Filled 2023-07-15 (×2): qty 6

## 2023-07-15 MED ORDER — FERROUS SULFATE 325 (65 FE) MG PO TABS
325.0000 mg | ORAL_TABLET | Freq: Two times a day (BID) | ORAL | Status: DC
  Administered 2023-07-15 – 2023-07-19 (×6): 325 mg via ORAL
  Filled 2023-07-15 (×8): qty 1

## 2023-07-15 MED ORDER — FLUCONAZOLE 100 MG PO TABS
200.0000 mg | ORAL_TABLET | Freq: Once | ORAL | Status: AC
  Administered 2023-07-15: 200 mg via ORAL
  Filled 2023-07-15: qty 2

## 2023-07-15 MED ORDER — HALOPERIDOL LACTATE 5 MG/ML IJ SOLN
3.0000 mg | Freq: Once | INTRAMUSCULAR | Status: AC | PRN
  Administered 2023-07-15: 3 mg via INTRAVENOUS
  Filled 2023-07-15: qty 1

## 2023-07-15 MED ORDER — GABAPENTIN 300 MG PO CAPS
300.0000 mg | ORAL_CAPSULE | Freq: Every day | ORAL | Status: DC
  Administered 2023-07-15 – 2023-07-16 (×2): 300 mg via ORAL
  Filled 2023-07-15 (×2): qty 1

## 2023-07-15 NOTE — Progress Notes (Signed)
   07/15/23 0981  Provider Notification  Provider Name/Title Dr. Brice Campi  Date Provider Notified 07/15/23  Time Provider Notified [phone]  Method of Notification Page  Notification Reason Change in status  Provider response See new orders  Date of Provider Response 07/15/23   Patient given IV Haldol  with no effect.  Patient continues to attempt to get out of bed without help and remains confused and irritable.  Dr. Brice Campi made aware.  Order received and implemented for Soft Waist Restraint and 4 side rails up.  Will continue to monitor patient.  Necia Bali RN

## 2023-07-15 NOTE — NC FL2 (Addendum)
[place] MEDICAID FL2 LEVEL OF CARE FORM     IDENTIFICATION  Patient Name: Calvin Escobar Birthdate: 1950/12/30 Sex: male Admission Date (Current Location): 07/12/2023  [facility] and [place] Number:  Producer, television/film/video and Address:  [facility] [place], [address] [place], [place] [address]      Provider Number: [id number]  Attending Physician Name and Address:  Kathlen Mody, MD  Relative Name and Phone Number:  Aadit, Hagood (Spouse)  [phone] (Mobile)    Current Level of Care: Hospital Recommended Level of Care: Skilled Nursing Facility Prior Approval Number:    Date Approved/Denied:   PASRR Number:  [id number] E Expires 08/15/23  Discharge Plan:      Current Diagnoses: Patient Active Problem List   Diagnosis Date Noted   High anion gap metabolic acidosis 07/13/2023   GERD (gastroesophageal reflux disease) 07/13/2023   Acute metabolic encephalopathy 07/12/2023   UTI (urinary tract infection) 07/01/2023   Urinary catheter complication (HCC) 06/24/2023   Abnormal urinalysis 06/24/2023   Severe sepsis (HCC) 06/03/2023   Septic shock from UTI 06/02/2023   Acute respiratory failure with hypoxia (HCC) 06/02/2023   Thrombocytopenia (HCC) 06/02/2023   LBBB (left bundle branch block) 06/02/2023   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   Urinary tract infection without hematuria 06/02/2023   E coli bacteremia 05/14/2023   Syncope 05/13/2023   Acute cystitis 05/13/2023   Diarrhea 05/13/2023   AKI (acute kidney injury) (HCC) 05/13/2023   Acute hyponatremia 05/13/2023   Obesity, Class II, BMI 35-39.9 05/13/2023   Anemia of chronic disease 05/12/2023   Tremor 05/08/2023   Lack of appetite 05/08/2023   Stage 3 chronic kidney disease (HCC) 03/07/2023   Normocytic anemia 03/07/2023   Limp 12/19/2022   Gynecomastia, male 12/19/2022   Chronic back pain 12/19/2022   Urinary incontinence 11/21/2022   Hypertension 11/21/2022   Generalized arthritis  07/18/2022   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   HFrEF (heart failure with reduced ejection fraction) (HCC) 07/30/2019   PAC (premature atrial contraction) 07/17/2019   DCM (dilated cardiomyopathy) (HCC)    OSA treated with BiPAP    Primary insomnia 08/05/2017   Dementia (HCC)    HLD (hyperlipidemia)    PUD (peptic ulcer disease) 01/12/2015   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   BPH (benign prostatic hyperplasia) 03/27/2012   Morbid obesity (HCC) 03/27/2012   Bipolar affective disorder, depressed (HCC) 05/10/2007   Chronic bronchitis (HCC) 05/10/2007    Orientation RESPIRATION BLADDER Height & Weight     Self, Place  Normal Indwelling catheter Weight: 224 lb 10.4 oz (101.9 kg) Height:  6\' 1"  (185.4 cm)  BEHAVIORAL SYMPTOMS/MOOD NEUROLOGICAL BOWEL NUTRITION STATUS      Incontinent Diet (see d/c summary)  AMBULATORY STATUS COMMUNICATION OF NEEDS Skin   Extensive Assist Verbally Normal                       Personal Care Assistance Level of Assistance  Bathing, Dressing, Feeding Bathing Assistance: Maximum assistance Feeding assistance: Independent Dressing Assistance: Limited assistance     Functional Limitations Info  Sight, Speech, Hearing Sight Info: Impaired Hearing Info: Adequate Speech Info: Adequate    SPECIAL CARE FACTORS FREQUENCY  PT (By licensed PT), OT (By licensed OT)     PT Frequency: 5x/week OT Frequency: 5x/week            Contractures Contractures Info: Not present    Additional Factors Info  Code Status, Allergies Code Status Info: DNR Allergies  Info: Albuterol           Current Medications (07/15/2023):  This is the current hospital active medication list Current Facility-Administered Medications  Medication Dose Route Frequency Provider Last Rate Last Admin   acetaminophen (TYLENOL) tablet 650 mg  650 mg Oral Q6H PRN Howerter, Justin B, DO   650 mg at 07/14/23 1740   Or   acetaminophen (TYLENOL) suppository 650  mg  650 mg Rectal Q6H PRN Howerter, Justin B, DO       ARIPiprazole (ABILIFY) tablet 2 mg  2 mg Oral Daily Howerter, Justin B, DO   2 mg at 07/15/23 1308   busPIRone (BUSPAR) tablet 10 mg  10 mg Oral TID Howerter, Justin B, DO   10 mg at 07/15/23 6578   Chlorhexidine Gluconate Cloth 2 % PADS 6 each  6 each Topical Daily Kathlen Mody, MD   6 each at 07/15/23 0940   cyanocobalamin (VITAMIN B12) injection 1,000 mcg  1,000 mcg Intramuscular Once Kathlen Mody, MD       divalproex (DEPAKOTE ER) 24 hr tablet 500 mg  500 mg Oral QHS Howerter, Justin B, DO   500 mg at 07/14/23 2212   ferrous sulfate tablet 325 mg  325 mg Oral BID WC Kathlen Mody, MD       fluconazole (DIFLUCAN) tablet 200 mg  200 mg Oral Once Kathlen Mody, MD       gabapentin (NEURONTIN) capsule 300 mg  300 mg Oral QHS Kathlen Mody, MD       Gerhardt's butt cream   Topical TID Briscoe Deutscher, MD   Given at 07/14/23 2211   melatonin tablet 3 mg  3 mg Oral QHS PRN Howerter, Justin B, DO   3 mg at 07/14/23 2211   midodrine (PROAMATINE) tablet 5 mg  5 mg Oral TID WC Howerter, Justin B, DO   5 mg at 07/15/23 4696   mirtazapine (REMERON) tablet 30 mg  30 mg Oral QHS Kathlen Mody, MD       ondansetron (ZOFRAN) injection 4 mg  4 mg Intravenous Q6H PRN Howerter, Justin B, DO       Oral care mouth rinse  15 mL Mouth Rinse PRN Howerter, Justin B, DO       pantoprazole (PROTONIX) EC tablet 40 mg  40 mg Oral QHS Howerter, Justin B, DO   40 mg at 07/14/23 2212   pravastatin (PRAVACHOL) tablet 20 mg  20 mg Oral QHS Howerter, Justin B, DO   20 mg at 07/14/23 2211   primidone (MYSOLINE) tablet 50 mg  50 mg Oral BID Howerter, Justin B, DO   50 mg at 07/15/23 2952   tamsulosin (FLOMAX) capsule 0.4 mg  0.4 mg Oral Daily Howerter, Justin B, DO   0.4 mg at 07/15/23 8413   traZODone (DESYREL) tablet 300 mg  300 mg Oral QHS Kathlen Mody, MD       vancomycin (VANCOCIN) capsule 125 mg  125 mg Oral QID Kathlen Mody, MD   125 mg at 07/15/23 2440    venlafaxine XR (EFFEXOR-XR) 24 hr capsule 300 mg  300 mg Oral Q breakfast Howerter, Justin B, DO   300 mg at 07/15/23 0929   [START ON 07/16/2023] vitamin B-12 (CYANOCOBALAMIN) tablet 500 mcg  500 mcg Oral Daily Kathlen Mody, MD         Discharge Medications: Please see discharge summary for a list of discharge medications.  Relevant Imaging Results:  Relevant Lab Results:   Additional Information  ss#[id number].  Khristen Cheyney Aris Lot, LCSW

## 2023-07-15 NOTE — Progress Notes (Signed)
   07/15/23 0111  Provider Notification  Provider Name/Title Dr. Brice Campi  Date Provider Notified 07/15/23  Time Provider Notified 0111  Method of Notification Page  Notification Reason Change in status  Provider response See new orders  Date of Provider Response 07/15/23  Time of Provider Response 0116   As the night has progressed, the patient has become more restless and irritable.  He is climbing out of bed and pulling at his PIV and Foley Catheter.  The SCDs were removed d/t making the patient more agitated and he was attempting to kick them off.  He seems more confused tonight and only alert to self.  He states he just wants to sleep.  Melatonin given with no success.  Green Safety Mittens attempted but he is able to remove them.  Dr. Brice Campi made aware.  Order received for Haldol .  Will administer to patient.  Will continue to monitor patient.  Necia Bali RN

## 2023-07-15 NOTE — Telephone Encounter (Signed)
 Pharmacy Patient Advocate Encounter  Insurance verification completed.    The patient is insured through [employer].     Ran test claim for Vancomycin  caps and the current 30 day co-pay is $104.94.   This test claim was processed through [facility]- copay amounts may vary at other pharmacies due to pharmacy/plan contracts, or as the patient moves through the different stages of their insurance plan.

## 2023-07-15 NOTE — Progress Notes (Signed)
       RE:   Calvin Escobar  Date of Birth:  April 16, 1951   Date:   07/15/2023    To Whom It May Concern:  Please be advised that the above-named patient will require a short-term nursing home stay - anticipated 30 days or less for rehabilitation and strengthening.  The plan is for return home.

## 2023-07-15 NOTE — Progress Notes (Signed)
 Mobility Specialist Progress Note:    07/15/23 1400  Mobility  Activity Ambulated with assistance in room  Level of Assistance Contact guard assist, steadying assist  Assistive Device Front wheel walker  Distance Ambulated (ft) 80 ft (20' x4)  Activity Response Tolerated well  Mobility Referral Yes  Mobility visit 1 Mobility  Mobility Specialist Start Time (ACUTE ONLY) 1404  Mobility Specialist Stop Time (ACUTE ONLY) 1422  Mobility Specialist Time Calculation (min) (ACUTE ONLY) 18 min   Pt received trying to get OOB and very agitated. Pt calmed down once sitting EOB. Eager to ambulate. Able to stand w/ minA and ambulate around room w/ contact guard. No complaints throughout. Left in bed with call bell and all needs met. Waist restraint and bed alarm on.  D'Vante Nolon Baxter Mobility Specialist Please contact via Special educational needs teacher or Rehab office at [phone]

## 2023-07-15 NOTE — TOC Initial Note (Signed)
 Transition of Care [facility]) - Initial/Assessment Note    Patient Details  Name: Calvin Escobar MRN: [id number] Date of Birth: 04-03-1951  Transition of Care [facility]) CM/SW Contact:    Katrinka Parr, LCSW Phone Number: 07/15/2023, 10:47 AM  Clinical Narrative:                  Pt not oriented to situation. CSW called pt's spouse to discuss SNF recommendation. CSW explained SNF process. Spouse is agreeable to SNF w/u. Fl2 completed and bed requests sent in hub. TOC will follow up with SNF bed offers.   PASRR is pending  Expected Discharge Plan: Skilled Nursing Facility Barriers to Discharge: Continued Medical Work up        Expected Discharge Plan and Services       Living arrangements for the past 2 months: Single Family Home                                      Prior Living Arrangements/Services Living arrangements for the past 2 months: Single Family Home Lives with:: Spouse Patient language and need for interpreter reviewed:: Yes        Need for Family Participation in Patient Care: Yes (Comment) Care giver support system in place?: Yes (comment)   Criminal Activity/Legal Involvement Pertinent to Current Situation/Hospitalization: No - Comment as needed  Activities of Daily Living   ADL Screening (condition at time of admission) Independently performs ADLs?: No Does the patient have a NEW difficulty with bathing/dressing/toileting/self-feeding that is expected to last >3 days?: Yes (Initiates electronic notice to provider for possible OT consult) Does the patient have a NEW difficulty with getting in/out of bed, walking, or climbing stairs that is expected to last >3 days?: Yes (Initiates electronic notice to provider for possible PT consult) Does the patient have a NEW difficulty with communication that is expected to last >3 days?: No Is the patient deaf or have difficulty hearing?: No Does the patient have difficulty seeing, even when wearing  glasses/contacts?: No Does the patient have difficulty concentrating, remembering, or making decisions?: Yes  Permission Sought/Granted                  Emotional Assessment       Orientation: : Oriented to Self, Oriented to Place Alcohol / Substance Use: Not Applicable Psych Involvement: No (comment)  Admission diagnosis:  Hyponatremia [E87.1] Acute renal insufficiency [N28.9] Acute cystitis without hematuria [N30.00] Acute encephalopathy [G93.40] Fever in adult [R50.9] Acute metabolic encephalopathy [G93.41] Patient Active Problem List   Diagnosis Date Noted   High anion gap metabolic acidosis 07/13/2023   GERD (gastroesophageal reflux disease) 07/13/2023   Acute metabolic encephalopathy 07/12/2023   UTI (urinary tract infection) 07/01/2023   Urinary catheter complication (HCC) 06/24/2023   Abnormal urinalysis 06/24/2023   Severe sepsis (HCC) 06/03/2023   Septic shock from UTI 06/02/2023   Acute respiratory failure with hypoxia (HCC) 06/02/2023   Thrombocytopenia (HCC) 06/02/2023   LBBB (left bundle branch block) 06/02/2023   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   Urinary tract infection without hematuria 06/02/2023   E coli bacteremia 05/14/2023   Syncope 05/13/2023   Acute cystitis 05/13/2023   Diarrhea 05/13/2023   AKI (acute kidney injury) (HCC) 05/13/2023   Acute hyponatremia 05/13/2023   Obesity, Class II, BMI 35-39.9 05/13/2023   Anemia of chronic disease 05/12/2023   Tremor 05/08/2023   Lack of appetite 05/08/2023  Stage 3 chronic kidney disease (HCC) 03/07/2023   Normocytic anemia 03/07/2023   Limp 12/19/2022   Gynecomastia, male 12/19/2022   Chronic back pain 12/19/2022   Urinary incontinence 11/21/2022   Hypertension 11/21/2022   Generalized arthritis 07/18/2022   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   HFrEF (heart failure with reduced ejection fraction) (HCC) 07/30/2019   PAC (premature atrial contraction) 07/17/2019   DCM (dilated  cardiomyopathy) (HCC)    OSA treated with BiPAP    Primary insomnia 08/05/2017   Dementia (HCC)    HLD (hyperlipidemia)    PUD (peptic ulcer disease) 01/12/2015   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   BPH (benign prostatic hyperplasia) 03/27/2012   Morbid obesity (HCC) 03/27/2012   Bipolar affective disorder, depressed (HCC) 05/10/2007   Chronic bronchitis (HCC) 05/10/2007   PCP:  Anthon Kins, MD Pharmacy:   [id number] - [place], [place] - [address] [place] [address] Phone: [phone] Fax: [phone]  [facility] [address] [place] [address] Phone: [phone] Fax: [phone]     Social Drivers of Health (SDOH) Social History: SDOH Screenings   Food Insecurity: No Food Insecurity (07/13/2023)  Housing: Low Risk  (07/13/2023)  Transportation Needs: No Transportation Needs (07/13/2023)  Utilities: Not At Risk (07/13/2023)  Depression (PHQ2-9): Low Risk  (03/07/2023)  Recent Concern: Depression (PHQ2-9) - High Risk (01/15/2023)  Financial Resource Strain: Low Risk  (10/03/2022)  Physical Activity: Inactive (05/04/2022)  Social Connections: Moderately Isolated (07/13/2023)  Stress: Stress Concern Present (11/30/2022)  Tobacco Use: Low Risk  (07/13/2023)   SDOH Interventions:     Readmission Risk Interventions    07/04/2023    1:27 PM 07/03/2023    2:48 PM 06/05/2023   10:12 AM  Readmission Risk Prevention Plan  Transportation Screening Complete Complete Complete  PCP or Specialist Appt within 5-7 Days Complete Complete   PCP or Specialist Appt within 3-5 Days   Complete  Home Care Screening Complete Complete   Medication Review (RN CM) Complete Complete   HRI or Home Care Consult   Complete  Social Work Consult for Recovery Care Planning/Counseling   Complete  Palliative Care Screening   Complete  Medication Review Oceanographer)   Complete

## 2023-07-15 NOTE — Progress Notes (Signed)
 [facility]                                                                               Calvin Escobar, is a 73 y.o. male, DOB - April 19, 1951, ZOX:[id number] Admit date - 07/12/2023    Outpatient Primary MD for the patient is Anthon Kins, MD  LOS - 3  days    Brief summary   Calvin Escobar is a 73 y.o. male with medical history significant for dementia, hyperlipidemia, depression, GERD, BPH, anemia of chronic disease associated baseline hemoglobin 7, who is admitted to [facility] on 07/12/2023 with suspected acute metabolic encephalopathy after presenting from home to [facility] ED for evaluation of altered mental status.    Assessment & Plan    Assessment and Plan:  Acute metabolic encephalopathy from from AKI, and dehydration.  Creatinine improved from 1.4 to 1.2 to 1.02 Pt is more alert and awake and oriented to place and person.  He was agitated overnight.    Sepsis from possible UTI:  Pt febrile of 101.6, tachycardic, tachypnea of 23/min and [place] [address], UA suggestive of possible UTI.  Ua with large leukocytes and few bacteria.  Blood cultures are negative so far.  Urine cultures grew yeast and 20,000 colonies of enterococcus.  Patient received 3 days of IV cefepime  as he had a h/o pseudomonas UTI recently treated with Levaquin .  Leukocytosis improving.  Procalcitonin of 0.43  Dementia  Agitated overnight, requiring IV haldol .  Patient is on Mirtazapine , trazodone  300 mg at bedtime. Which were restarted.    GERD Stable.    Anemia of chronic disease/ iron deficiency anemia Hemoglobin at baseline around 8.  Iron supplementation on discharge.  Vit b12 low normal. Pt having diarrhea, will give a dose of IM vit b12.    Hyponatremia: possibly from hypovolemia.  Sodium stable around 132.     BPH Continue with flomax .    Hyperlipidemia Continue with pravastatin .   C diff diarrhea Started the patient on oral vancomycin .   Continues to have diarrhea.   AKI Resolved.    Hypokalemia:  Replaced and repeat in am.    Therapy eval recommending SNF.   Estimated body mass index is 29.64 kg/m as calculated from the following:   Height as of this encounter: 6\' 1"  (1.854 m).   Weight as of this encounter: 101.9 kg.  Code Status: DNR limited.  DVT Prophylaxis:  SCDs Start: 07/13/23 0008   Level of Care: Level of care: Telemetry Medical Family Communication: none at bedside.   Disposition Plan:     Remains inpatient appropriate:  pending urine cultures.   Procedures:  None.   Consultants:   None.   Antimicrobials:   Anti-infectives (From admission, onward)    Start     Dose/Rate Route Frequency Ordered Stop   07/15/23 1100  fluconazole  (DIFLUCAN ) tablet 200 mg        200 mg Oral  Once 07/15/23 1003     07/13/23 1400  vancomycin  (VANCOCIN ) capsule 125 mg        125 mg Oral 4 times daily 07/13/23 1139 07/23/23 1359   07/12/23 2330  ceFEPIme  (MAXIPIME ) 2  g in sodium chloride  0.9 % 100 mL IVPB  Status:  Discontinued        2 g 200 mL/hr over 30 Minutes Intravenous Every 24 hours 07/12/23 2326 07/15/23 1014   07/12/23 2315  levofloxacin  (LEVAQUIN ) IVPB 750 mg  Status:  Discontinued        750 mg 100 mL/hr over 90 Minutes Intravenous  Once 07/12/23 2313 07/12/23 2326   07/12/23 2145  cefTRIAXone  (ROCEPHIN ) 1 g in sodium chloride  0.9 % 100 mL IVPB        1 g 200 mL/hr over 30 Minutes Intravenous  Once 07/12/23 2133 07/12/23 2331        Medications  Scheduled Meds:  ARIPiprazole   2 mg Oral Daily   busPIRone   10 mg Oral TID   Chlorhexidine  Gluconate Cloth  6 each Topical Daily   cyanocobalamin   1,000 mcg Intramuscular Once   divalproex   500 mg Oral QHS   fluconazole   200 mg Oral Once   Gerhardt's butt cream   Topical TID   midodrine   5 mg Oral TID WC   pantoprazole   40 mg Oral QHS   pravastatin   20 mg Oral QHS   primidone   50 mg Oral BID   tamsulosin   0.4 mg Oral Daily   vancomycin    125 mg Oral QID   venlafaxine  XR  300 mg Oral Q breakfast   [START ON 07/16/2023] vitamin B-12  500 mcg Oral Daily   Continuous Infusions:   PRN Meds:.acetaminophen  **OR** acetaminophen , melatonin, ondansetron  (ZOFRAN ) IV, mouth rinse    Subjective:   Calvin Escobar was seen and examined today.  Alert and awake, wants to know when h e can be discharged.  Objective:   Vitals:   07/14/23 1616 07/14/23 2125 07/15/23 0611 07/15/23 0749  BP: (!) 139/90 124/81 115/74 123/79  Pulse: (!) 109 (!) 108 90 (!) 105  Resp: 18 18 18    Temp: 98.4 F (36.9 C) 97.7 F (36.5 C) 97.9 F (36.6 C)   TempSrc:  Oral    SpO2:  96% 98% 97%  Weight:      Height:        Intake/Output Summary (Last 24 hours) at 07/15/2023 1021 Last data filed at 07/15/2023 0600 Gross per 24 hour  Intake 1230.06 ml  Output 1650 ml  Net -419.94 ml   Filed Weights   07/12/23 2014 07/13/23 0139  Weight: 101.6 kg 101.9 kg     Exam General exam: elderly gentleman, not in distress.  Respiratory system: Clear to auscultation. Respiratory effort normal. Cardiovascular system: S1 & S2 heard, RRR. Gastrointestinal system: Abdomen is soft, mild general tenderness.  Central nervous system: Alert and oriented to self only.  Extremities: no pedal edema.  Skin: No rashes,  Psychiatry: calm today.       Data Reviewed:  I have personally reviewed following labs and imaging studies   CBC Lab Results  Component Value Date   WBC 12.5 (H) 07/14/2023   RBC 3.05 (L) 07/14/2023   HGB 8.2 (L) 07/14/2023   HCT 25.8 (L) 07/14/2023   MCV 84.6 07/14/2023   MCH 26.9 07/14/2023   PLT 207 07/14/2023   MCHC 31.8 07/14/2023   RDW 15.9 (H) 07/14/2023   LYMPHSABS 1.1 07/14/2023   MONOABS 1.2 (H) 07/14/2023   EOSABS 0.4 07/14/2023   BASOSABS 0.2 (H) 07/14/2023     Last metabolic panel Lab Results  Component Value Date   NA 132 (L) 07/14/2023   K 3.4 (L) 07/14/2023  CL 98 07/14/2023   CO2 23 07/14/2023   BUN 11  07/14/2023   CREATININE 1.02 07/14/2023   GLUCOSE 99 07/14/2023   GFRNONAA >60 07/14/2023   GFRAA 67 11/13/2019   CALCIUM  8.1 (L) 07/14/2023   PHOS 3.9 07/05/2023   PROT 6.6 07/13/2023   ALBUMIN  2.4 (L) 07/13/2023   LABGLOB 2.8 10/28/2019   AGRATIO 1.5 10/28/2019   BILITOT 0.9 07/13/2023   ALKPHOS 47 07/13/2023   AST 24 07/13/2023   ALT 13 07/13/2023   ANIONGAP 11 07/14/2023    CBG (last 3)  No results for input(s): "GLUCAP" in the last 72 hours.    Coagulation Profile: Recent Labs  Lab 07/14/23 0217  INR 1.3*     Radiology Studies: No results found.      Feliciana Horn M.D. [facility] 07/15/2023, 10:21 AM  Available via Epic secure chat 7am-7pm After 7 pm, please refer to night coverage provider listed on amion.

## 2023-07-16 DIAGNOSIS — G9341 Metabolic encephalopathy: Secondary | ICD-10-CM | POA: Diagnosis not present

## 2023-07-16 DIAGNOSIS — N3000 Acute cystitis without hematuria: Secondary | ICD-10-CM | POA: Diagnosis not present

## 2023-07-16 DIAGNOSIS — E871 Hypo-osmolality and hyponatremia: Secondary | ICD-10-CM | POA: Diagnosis not present

## 2023-07-16 DIAGNOSIS — K219 Gastro-esophageal reflux disease without esophagitis: Secondary | ICD-10-CM | POA: Diagnosis not present

## 2023-07-16 LAB — BASIC METABOLIC PANEL
Anion gap: 13 (ref 5–15)
BUN: 7 mg/dL — ABNORMAL LOW (ref 8–23)
CO2: 23 mmol/L (ref 22–32)
Calcium: 8.6 mg/dL — ABNORMAL LOW (ref 8.9–10.3)
Chloride: 101 mmol/L (ref 98–111)
Creatinine, Ser: 1.09 mg/dL (ref 0.61–1.24)
GFR, Estimated: >60 mL/min (ref >60)
Glucose, Bld: 89 mg/dL (ref 70–99)
Potassium: 3.5 mmol/L (ref 3.5–5.1)
Sodium: 137 mmol/L (ref 135–145)

## 2023-07-16 LAB — CBC WITH DIFFERENTIAL/PLATELET
Abs Immature Granulocytes: 0.08 10*3/uL — ABNORMAL HIGH (ref 0.00–0.07)
Basophils Absolute: 0.2 10*3/uL — ABNORMAL HIGH (ref 0.0–0.1)
Basophils Relative: 2 %
Eosinophils Absolute: 0.3 10*3/uL (ref 0.0–0.5)
Eosinophils Relative: 4 %
HCT: 27.8 % — ABNORMAL LOW (ref 39.0–52.0)
Hemoglobin: 8.6 g/dL — ABNORMAL LOW (ref 13.0–17.0)
Immature Granulocytes: 1 %
Lymphocytes Relative: 18 %
Lymphs Abs: 1.5 10*3/uL (ref 0.7–4.0)
MCH: 26.6 pg (ref 26.0–34.0)
MCHC: 30.9 g/dL (ref 30.0–36.0)
MCV: 86.1 fL (ref 80.0–100.0)
Monocytes Absolute: 1.1 10*3/uL — ABNORMAL HIGH (ref 0.1–1.0)
Monocytes Relative: 13 %
Neutro Abs: 5.5 10*3/uL (ref 1.7–7.7)
Neutrophils Relative %: 62 %
Platelets: 268 10*3/uL (ref 150–400)
RBC: 3.23 MIL/uL — ABNORMAL LOW (ref 4.22–5.81)
RDW: 15.7 % — ABNORMAL HIGH (ref 11.5–15.5)
WBC: 8.7 10*3/uL (ref 4.0–10.5)
nRBC: 0 % (ref 0.0–0.2)

## 2023-07-16 LAB — URINE CULTURE

## 2023-07-16 MED ORDER — HALOPERIDOL LACTATE 5 MG/ML IJ SOLN
3.0000 mg | Freq: Once | INTRAMUSCULAR | Status: AC | PRN
  Administered 2023-07-16: 3 mg via INTRAVENOUS
  Filled 2023-07-16: qty 1

## 2023-07-16 NOTE — Progress Notes (Signed)
[facility]                                                                               Calvin Escobar, is a 73 y.o. male, DOB - February 27, 1951, RUE:[id number] Admit date - 07/12/2023    Outpatient Primary MD for the patient is [name], MD  LOS - 4  days    Brief summary   Calvin Escobar is a 73 y.o. male with medical history significant for dementia, hyperlipidemia, depression, GERD, BPH, anemia of chronic disease associated baseline hemoglobin 7, who is admitted to [facility] on 07/12/2023 with suspected acute metabolic encephalopathy after presenting from home to [facility] ED for evaluation of altered mental status.    Assessment & Plan    Assessment and Plan:  Acute metabolic encephalopathy from from AKI, and dehydration.  Creatinine improved from 1.4 to 1.2 to 1.02 Pt is more alert and awake and oriented to place and person.     Sepsis from possible UTI:  Pt febrile of 101.6, tachycardic, tachypnea of 23/min and with leukocytosis of 18,500,  UA suggestive of possible UTI, with large leukocytes and few bacteria.  Blood cultures are negative so far.  Urine cultures grew yeast and 20,000 colonies of enterococcus.  Patient received 3 days of IV cefepime as he had a h/o pseudomonas UTI recently treated with Levaquin.  Ordered a dose of diflucan 200 mg .   Dementia  Agitated overnight, requiring IV haldol.  Patient is on Mirtazapine, trazodone 300 mg at bedtime. Which were restarted.    GERD Stable.    Anemia of chronic disease/ iron deficiency anemia Hemoglobin at baseline around 8.  Iron supplementation on discharge.  Vit b12 low normal. Pt having diarrhea, will give a dose of IM vit b12.    Hyponatremia:  Resolved.     BPH Continue with flomax.    Hyperlipidemia Continue with pravastatin.   C diff diarrhea Started the patient on oral vancomycin.  Improving diarrhea.   AKI Resolved.    Hypokalemia:  Replaced and repeat in  am.    Therapy eval recommending SNF.   Estimated body mass index is 29.64 kg/m as calculated from the following:   Height as of this encounter: 6\' 1"  (1.854 m).   Weight as of this encounter: 101.9 kg.  Code Status: DNR limited.  DVT Prophylaxis:  SCDs Start: 07/13/23 0008   Level of Care: Level of care: Telemetry Medical Family Communication: none at bedside.   Disposition Plan:     Remains inpatient appropriate:  SNF when bed available.   Procedures:  None.   Consultants:   None.   Antimicrobials:   Anti-infectives (From admission, onward)    Start     Dose/Rate Route Frequency Ordered Stop   07/15/23 1100  fluconazole (DIFLUCAN) tablet 200 mg        200 mg Oral  Once 07/15/23 1003 07/15/23 1139   07/13/23 1400  vancomycin (VANCOCIN) capsule 125 mg        125 mg Oral 4 times daily 07/13/23 1139 07/23/23 1359   07/12/23 2330  ceFEPIme (MAXIPIME) 2 g in sodium chloride 0.9 % 100 mL IVPB  Status:  Discontinued        2 g 200 mL/hr over 30 Minutes Intravenous Every 24 hours 07/12/23 2326 07/15/23 1014   07/12/23 2315  levofloxacin (LEVAQUIN) IVPB 750 mg  Status:  Discontinued        750 mg 100 mL/hr over 90 Minutes Intravenous  Once 07/12/23 2313 07/12/23 2326   07/12/23 2145  cefTRIAXone (ROCEPHIN) 1 g in sodium chloride 0.9 % 100 mL IVPB        1 g 200 mL/hr over 30 Minutes Intravenous  Once 07/12/23 2133 07/12/23 2331        Medications  Scheduled Meds:  ARIPiprazole  2 mg Oral Daily   busPIRone  10 mg Oral TID   Chlorhexidine Gluconate Cloth  6 each Topical Daily   divalproex  500 mg Oral QHS   ferrous sulfate  325 mg Oral BID WC   gabapentin  300 mg Oral QHS   Gerhardt's butt cream   Topical TID   midodrine  5 mg Oral TID WC   mirtazapine  30 mg Oral QHS   pantoprazole  40 mg Oral QHS   pravastatin  20 mg Oral QHS   primidone  50 mg Oral BID   tamsulosin  0.4 mg Oral Daily   traZODone  300 mg Oral QHS   vancomycin  125 mg Oral QID   venlafaxine XR   300 mg Oral Q breakfast   vitamin B-12  500 mcg Oral Daily   Continuous Infusions:   PRN Meds:.acetaminophen **OR** acetaminophen, melatonin, ondansetron (ZOFRAN) IV, mouth rinse    Subjective:   Calvin Escobar was seen and examined today.   No new complaints.   Objective:   Vitals:   07/15/23 2026 07/16/23 0500 07/16/23 0509 07/16/23 0752  BP: 128/76  129/74 113/83  Pulse: 96  (!) 123 86  Resp: 18  18 20   Temp: 98.7 F (37.1 C)  98.1 F (36.7 C) 98.5 F (36.9 C)  TempSrc: Oral   Oral  SpO2: 98%  97% 94%  Weight:  101.9 kg    Height:        Intake/Output Summary (Last 24 hours) at 07/16/2023 1311 Last data filed at 07/16/2023 0713 Gross per 24 hour  Intake --  Output 4200 ml  Net -4200 ml   Filed Weights   07/12/23 2014 07/13/23 0139 07/16/23 0500  Weight: 101.6 kg 101.9 kg 101.9 kg     Exam General exam: Appears calm and comfortable  Respiratory system: Clear to auscultation. Respiratory effort normal. Cardiovascular system: S1 & S2 heard, RRR. No JVD, Gastrointestinal system: Abdomen is nondistended, soft and nontender.  Central nervous system: Alert and oriented. Extremities:  no pedal edema.  Skin: No rashes,  Psychiatry:  Mood & affect appropriate.        Data Reviewed:  I have personally reviewed following labs and imaging studies   CBC Lab Results  Component Value Date   WBC 8.7 07/16/2023   RBC 3.23 (L) 07/16/2023   HGB 8.6 (L) 07/16/2023   HCT 27.8 (L) 07/16/2023   MCV 86.1 07/16/2023   MCH 26.6 07/16/2023   PLT 268 07/16/2023   MCHC 30.9 07/16/2023   RDW 15.7 (H) 07/16/2023   LYMPHSABS 1.5 07/16/2023   MONOABS 1.1 (H) 07/16/2023   EOSABS 0.3 07/16/2023   BASOSABS 0.2 (H) 07/16/2023     Last metabolic panel Lab Results  Component Value Date   NA 137 07/16/2023   K 3.5 07/16/2023   CL  101 07/16/2023   CO2 23 07/16/2023   BUN 7 (L) 07/16/2023   CREATININE 1.09 07/16/2023   GLUCOSE 89 07/16/2023   GFRNONAA >60 07/16/2023    GFRAA 67 11/13/2019   CALCIUM 8.6 (L) 07/16/2023   PHOS 3.9 07/05/2023   PROT 6.6 07/15/2023   ALBUMIN 2.4 (L) 07/15/2023   LABGLOB 2.8 10/28/2019   AGRATIO 1.5 10/28/2019   BILITOT 0.3 07/15/2023   ALKPHOS 52 07/15/2023   AST 24 07/15/2023   ALT 12 07/15/2023   ANIONGAP 13 07/16/2023    CBG (last 3)  Recent Labs    07/15/23 2043  GLUCAP 97      Coagulation Profile: Recent Labs  Lab 07/14/23 0217  INR 1.3*     Radiology Studies: No results found.      Kathlen Mody M.D. [facility] 07/16/2023, 1:11 PM  Available via Epic secure chat 7am-7pm After 7 pm, please refer to night coverage provider listed on amion.

## 2023-07-16 NOTE — Plan of Care (Signed)
  Problem: Clinical Measurements: Goal: Ability to maintain clinical measurements within normal limits will improve Outcome: Progressing Goal: Will remain free from infection Outcome: Progressing Goal: Diagnostic test results will improve Outcome: Progressing Goal: Respiratory complications will improve Outcome: Progressing Goal: Cardiovascular complication will be avoided Outcome: Progressing   Problem: Nutrition: Goal: Adequate nutrition will be maintained Outcome: Progressing   Problem: Coping: Goal: Level of anxiety will decrease Outcome: Progressing   Problem: Elimination: Goal: Will not experience complications related to bowel motility Outcome: Progressing Goal: Will not experience complications related to urinary retention Outcome: Progressing   Problem: Pain Managment: Goal: General experience of comfort will improve and/or be controlled Outcome: Progressing   Problem: Skin Integrity: Goal: Risk for impaired skin integrity will decrease Outcome: Progressing   Problem: Education: Goal: Knowledge of General Education information will improve Description: Including pain rating scale, medication(s)/side effects and non-pharmacologic comfort measures Outcome: Not Progressing   Problem: Health Behavior/Discharge Planning: Goal: Ability to manage health-related needs will improve Outcome: Not Progressing   Problem: Activity: Goal: Risk for activity intolerance will decrease Outcome: Not Progressing   Problem: Safety: Goal: Ability to remain free from injury will improve Outcome: Not Progressing  Calmer this afternoon, finally sleeping. Confusion limits education. PT worked with pt

## 2023-07-16 NOTE — Progress Notes (Addendum)
Physical Therapy Treatment Patient Details Name: Calvin Escobar MRN: [id number] DOB: Oct 31, 1950 Today's Date: 07/16/2023   History of Present Illness Pt is a 73 y/o M presenting to ED on 2/7 with AMS, admitted for acute metabolic encephalopathy from AKI, sepsis from possible UTI. Recent admission for pseudomonas UTI presenting with dysuria/penile pain. PMH includes dementia, HLD, depression, GERD, BPH, anemia of chronic disease associated hgb 7    PT Comments  Patient was lethargic today with multimodal sensory stimulation performed to increase alertness prior to mobilizing. Patient required increased assistance with bed mobility today. One partial standing bout performed with assistance and bowel incontinence upon standing. Max A required for peri care. Patient appears fatigued with minimal activity today. Recommend to continue PT to maximize independence with rehabilitation < 3 hours/day after this hospital stay.      If plan is discharge home, recommend the following: A little help with walking and/or transfers;A little help with bathing/dressing/bathroom;Assistance with cooking/housework;Direct supervision/assist for medications management;Assist for transportation;Help with stairs or ramp for entrance   Can travel by private vehicle     No  Equipment Recommendations  Rolling walker (2 wheels)    Recommendations for Other Services       Precautions / Restrictions Precautions Precautions: Fall Restrictions Weight Bearing Restrictions Per Provider Order: No     Mobility  Bed Mobility Overal bed mobility: Needs Assistance Bed Mobility: Supine to Sit, Sit to Supine, Rolling Rolling: Min assist, Used rails   Supine to sit: Mod assist Sit to supine: Mod assist   General bed mobility comments: assistance for trunk support to sit upright with cues for task initiation. assistance for LE support to return to bed. rolling performed with Min A for pericare and to change sheets as  patient was incontinent of bowels with sitting up    Transfers Overall transfer level: Needs assistance Equipment used: 1 person hand held assist Transfers: Sit to/from Stand Sit to Stand: Mod assist           General transfer comment: Mod A for partial stand from bed with cues for anterior weight shifting and hand placement. patient groggy and requesting to return to bed    Ambulation/Gait               General Gait Details: not attempted as patient groggy throughout session today   Stairs             Wheelchair Mobility     Tilt Bed    Modified Rankin (Stroke Patients Only)       Balance Overall balance assessment: Needs assistance Sitting-balance support: Feet supported Sitting balance-Leahy Scale: Fair                                      Hotel manager: Impaired Factors Affecting Communication: Reduced clarity of speech (intermittently)  Cognition Arousal: Lethargic Behavior During Therapy: WFL for tasks assessed/performed   PT - Cognitive impairments: History of cognitive impairments                       PT - Cognition Comments: patient is groggy throughout session. multimodal sensory stimulation provided (auditory, tactile, proprioception) to facilitate arousal before initiating bed mobility. Following commands: Impaired Following commands impaired: Follows one step commands inconsistently    Cueing Cueing Techniques: Verbal cues, Tactile cues  Exercises      General Comments General comments (skin  integrity, edema, etc.): soft waist belt intact at end of session.      Pertinent Vitals/Pain Pain Assessment Pain Assessment: Faces Faces Pain Scale: Hurts a little bit Pain Location: grimicing with pericare following bowel movement Pain Descriptors / Indicators: Grimacing Pain Intervention(s): Repositioned, Monitored during session    Home Living                           Prior Function            PT Goals (current goals can now be found in the care plan section) Acute Rehab PT Goals Patient Stated Goal: to get better and get out of here PT Goal Formulation: With patient Time For Goal Achievement: 07/28/23 Potential to Achieve Goals: Good Progress towards PT goals: Progressing toward goals    Frequency    Min 1X/week      PT Plan      Co-evaluation              AM-PAC PT "6 Clicks" Mobility   Outcome Measure  Help needed turning from your back to your side while in a flat bed without using bedrails?: None Help needed moving from lying on your back to sitting on the side of a flat bed without using bedrails?: A Little Help needed moving to and from a bed to a chair (including a wheelchair)?: A Little Help needed standing up from a chair using your arms (e.g., wheelchair or bedside chair)?: A Little Help needed to walk in hospital room?: A Lot Help needed climbing 3-5 steps with a railing? : A Lot 6 Click Score: 17    End of Session   Activity Tolerance: Patient limited by lethargy Patient left: in bed;with call bell/phone within reach;with bed alarm set;with restraints reapplied (soft waist belt) Nurse Communication: Mobility status PT Visit Diagnosis: Other abnormalities of gait and mobility (R26.89);Muscle weakness (generalized) (M62.81)     Time: 1321-1350 PT Time Calculation (min) (ACUTE ONLY): 29 min  Charges:    $Therapeutic Activity: 23-37 mins PT General Charges $$ ACUTE PT VISIT: 1 Visit                    Donna Bernard, PT, MPT    Ina Homes 07/16/2023, 2:33 PM

## 2023-07-16 NOTE — TOC Progression Note (Signed)
Transition of Care [facility]) - Progression Note    Patient Details  Name: Calvin Escobar MRN: [id number] Date of Birth: [birth year]  [facility] CM/SW Contact  Baldemar Lenis, [place] Phone Number: 07/16/2023, 1:50 PM  Clinical Narrative:   CSW spoke with spouse, Kerby Moors, to provide bed offers for SNF. [name] to review options, expressed some concern about how far away they were. CSW spoke with Kerby Moors about where they live, and it appears [facility] would be the closest location to their home. CSW reached out to [facility] to review, as referral is still pending; awaiting response. CSW sent [name] bed offers via email per request. CSW to follow.    Expected Discharge Plan: Skilled Nursing Facility Barriers to Discharge: Continued Medical Work up, English as a second language teacher  Expected Discharge Plan and Services       Living arrangements for the past 2 months: Single Family Home                                       Social Determinants of Health (SDOH) Interventions SDOH Screenings   Food Insecurity: No Food Insecurity (07/13/2023)  Housing: Low Risk  (07/13/2023)  Transportation Needs: No Transportation Needs (07/13/2023)  Utilities: Not At Risk (07/13/2023)  Depression (PHQ2-9): Low Risk  (03/07/2023)  Recent Concern: Depression (PHQ2-9) - High Risk (01/15/2023)  Financial Resource Strain: Low Risk  (10/03/2022)  Physical Activity: Inactive (05/04/2022)  Social Connections: Moderately Isolated (07/13/2023)  Stress: Stress Concern Present (11/30/2022)  Tobacco Use: Low Risk  (07/13/2023)    Readmission Risk Interventions    07/04/2023    1:27 PM 07/03/2023    2:48 PM 06/05/2023   10:12 AM  Readmission Risk Prevention Plan  Transportation Screening Complete Complete Complete  PCP or Specialist Appt within 5-7 Days Complete Complete   PCP or Specialist Appt within 3-5 Days   Complete  Home Care Screening Complete Complete   Medication Review (RN CM) Complete Complete   HRI  or Home Care Consult   Complete  Social Work Consult for Recovery Care Planning/Counseling   Complete  Palliative Care Screening   Complete  Medication Review Oceanographer)   Complete

## 2023-07-16 NOTE — Plan of Care (Signed)
    Problem: Safety: Goal: Non-violent Restraint(s) Outcome: Progressing   Problem: Education: Goal: Ability to describe self-care measures that may prevent or decrease complications (Diabetes Survival Skills Education) will improve Outcome: Progressing Goal: Individualized Educational Video(s) Outcome: Progressing   Problem: Coping: Goal: Ability to adjust to condition or change in health will improve Outcome: Progressing   Problem: Fluid Volume: Goal: Ability to maintain a balanced intake and output will improve Outcome: Progressing   Problem: Health Behavior/Discharge Planning: Goal: Ability to identify and utilize available resources and services will improve Outcome: Progressing Goal: Ability to manage health-related needs will improve Outcome: Progressing   Problem: Metabolic: Goal: Ability to maintain appropriate glucose levels will improve Outcome: Progressing   Problem: Nutritional: Goal: Maintenance of adequate nutrition will improve Outcome: Progressing Goal: Progress toward achieving an optimal weight will improve Outcome: Progressing   Problem: Skin Integrity: Goal: Risk for impaired skin integrity will decrease Outcome: Progressing   Problem: Tissue Perfusion: Goal: Adequacy of tissue perfusion will improve Outcome: Progressing   Problem: Education: Goal: Ability to describe self-care measures that may prevent or decrease complications (Diabetes Survival Skills Education) will improve Outcome: Progressing Goal: Individualized Educational Video(s) Outcome: Progressing   Problem: Cardiac: Goal: Ability to maintain an adequate cardiac output will improve Outcome: Progressing   Problem: Health Behavior/Discharge Planning: Goal: Ability to identify and utilize available resources and services will improve Outcome: Progressing Goal: Ability to manage health-related needs will improve Outcome: Progressing   Problem: Fluid Volume: Goal: Ability to  achieve a balanced intake and output will improve Outcome: Progressing   Problem: Nutritional: Goal: Maintenance of adequate nutrition will improve Outcome: Progressing Goal: Maintenance of adequate weight for body size and type will improve Outcome: Progressing   Problem: Respiratory: Goal: Will regain and/or maintain adequate ventilation Outcome: Progressing   Problem: Urinary Elimination: Goal: Ability to achieve and maintain adequate renal perfusion and functioning will improve Outcome: Progressing   Problem: Education: Goal: Knowledge of General Education information will improve Description: Including pain rating scale, medication(s)/side effects and non-pharmacologic comfort measures Outcome: Progressing   Problem: Health Behavior/Discharge Planning: Goal: Ability to manage health-related needs will improve Outcome: Progressing   Problem: Clinical Measurements: Goal: Ability to maintain clinical measurements within normal limits will improve Outcome: Progressing Goal: Will remain free from infection Outcome: Progressing Goal: Diagnostic test results will improve Outcome: Progressing Goal: Respiratory complications will improve Outcome: Progressing Goal: Cardiovascular complication will be avoided Outcome: Progressing   Problem: Activity: Goal: Risk for activity intolerance will decrease Outcome: Progressing   Problem: Nutrition: Goal: Adequate nutrition will be maintained Outcome: Progressing   Problem: Coping: Goal: Level of anxiety will decrease Outcome: Progressing   Problem: Pain Managment: Goal: General experience of comfort will improve and/or be controlled Outcome: Progressing   Problem: Safety: Goal: Ability to remain free from injury will improve Outcome: Progressing   Problem: Skin Integrity: Goal: Risk for impaired skin integrity will decrease Outcome: Progressing

## 2023-07-17 ENCOUNTER — Inpatient Hospital Stay (HOSPITAL_COMMUNITY): Payer: Medicare HMO

## 2023-07-17 DIAGNOSIS — G9340 Encephalopathy, unspecified: Secondary | ICD-10-CM | POA: Diagnosis not present

## 2023-07-17 DIAGNOSIS — N289 Disorder of kidney and ureter, unspecified: Secondary | ICD-10-CM | POA: Diagnosis not present

## 2023-07-17 DIAGNOSIS — R509 Fever, unspecified: Secondary | ICD-10-CM

## 2023-07-17 DIAGNOSIS — G9341 Metabolic encephalopathy: Secondary | ICD-10-CM | POA: Diagnosis not present

## 2023-07-17 DIAGNOSIS — N3000 Acute cystitis without hematuria: Secondary | ICD-10-CM | POA: Diagnosis not present

## 2023-07-17 LAB — BLOOD GAS, VENOUS
Acid-Base Excess: 2.8 mmol/L — ABNORMAL HIGH (ref 0.0–2.0)
Bicarbonate: 28.5 mmol/L — ABNORMAL HIGH (ref 20.0–28.0)
O2 Saturation: 43.8 %
Patient temperature: 36.4
pCO2, Ven: 46 mm[Hg] (ref 44–60)
pH, Ven: 7.4 (ref 7.25–7.43)
pO2, Ven: 31 mm[Hg] — CL (ref 32–45)

## 2023-07-17 LAB — RETICULOCYTES
Immature Retic Fract: 26.1 % — ABNORMAL HIGH (ref 2.3–15.9)
RBC.: 3.6 MIL/uL — ABNORMAL LOW (ref 4.22–5.81)
Retic Count, Absolute: 56.4 10*3/uL (ref 19.0–186.0)
Retic Ct Pct: 1.6 % (ref 0.4–3.1)

## 2023-07-17 LAB — IRON AND TIBC
Iron: 26 ug/dL — ABNORMAL LOW (ref 45–182)
Saturation Ratios: 18 % (ref 17.9–39.5)
TIBC: 143 ug/dL — ABNORMAL LOW (ref 250–450)
UIBC: 117 ug/dL

## 2023-07-17 LAB — GLUCOSE, CAPILLARY: Glucose-Capillary: 85 mg/dL (ref 70–99)

## 2023-07-17 LAB — AMMONIA: Ammonia: 19 umol/L (ref 9–35)

## 2023-07-17 LAB — FERRITIN: Ferritin: 1278 ng/mL — ABNORMAL HIGH (ref 24–336)

## 2023-07-17 LAB — TSH: TSH: 0.926 u[IU]/mL (ref 0.350–4.500)

## 2023-07-17 LAB — FOLATE: Folate: 21 ng/mL (ref >5.9)

## 2023-07-17 LAB — VITAMIN B12: Vitamin B-12: 2202 pg/mL — ABNORMAL HIGH (ref 180–914)

## 2023-07-17 MED ORDER — NALOXONE HCL 0.4 MG/ML IJ SOLN
0.4000 mg | Freq: Once | INTRAMUSCULAR | Status: AC
Start: 2023-07-17 — End: 2023-07-17
  Administered 2023-07-17: 0.4 mg via INTRAVENOUS
  Filled 2023-07-17: qty 1

## 2023-07-17 MED ORDER — FLUCONAZOLE IN SODIUM CHLORIDE 200-0.9 MG/100ML-% IV SOLN
200.0000 mg | INTRAVENOUS | Status: DC
  Administered 2023-07-17 – 2023-07-18 (×2): 200 mg via INTRAVENOUS
  Filled 2023-07-17 (×3): qty 100

## 2023-07-17 MED ORDER — GABAPENTIN 100 MG PO CAPS
100.0000 mg | ORAL_CAPSULE | Freq: Three times a day (TID) | ORAL | Status: DC
  Administered 2023-07-18 – 2023-07-19 (×5): 100 mg via ORAL
  Filled 2023-07-17 (×5): qty 1

## 2023-07-17 MED ORDER — TRAZODONE HCL 50 MG PO TABS
100.0000 mg | ORAL_TABLET | Freq: Every evening | ORAL | Status: DC | PRN
Start: 2023-07-17 — End: 2023-07-19
  Administered 2023-07-18: 100 mg via ORAL
  Filled 2023-07-17: qty 2

## 2023-07-17 NOTE — TOC Progression Note (Signed)
Transition of Care [facility]) - Progression Note    Patient Details  Name: Fulton Merry MRN: [id number] Date of Birth: 1950-12-31  [facility] CM/SW Contact  Lorri Frederick, LCSW Phone Number: 07/17/2023, 10:48 AM  Clinical Narrative:   CSW informed by CSW Marisue Ivan that pt wife left voicemail, would like to accept offer at [facility].  CSW confirmed with Rhonda/Blumenthal.  Unclear DC date.  TOC will continue to follow.     Expected Discharge Plan: Skilled Nursing Facility Barriers to Discharge: Continued Medical Work up, English as a second language teacher  Expected Discharge Plan and Services       Living arrangements for the past 2 months: Single Family Home                                       Social Determinants of Health (SDOH) Interventions SDOH Screenings   Food Insecurity: No Food Insecurity (07/13/2023)  Housing: Low Risk  (07/13/2023)  Transportation Needs: No Transportation Needs (07/13/2023)  Utilities: Not At Risk (07/13/2023)  Depression (PHQ2-9): Low Risk  (03/07/2023)  Recent Concern: Depression (PHQ2-9) - High Risk (01/15/2023)  Financial Resource Strain: Low Risk  (10/03/2022)  Physical Activity: Inactive (05/04/2022)  Social Connections: Moderately Isolated (07/13/2023)  Stress: Stress Concern Present (11/30/2022)  Tobacco Use: Low Risk  (07/13/2023)    Readmission Risk Interventions    07/04/2023    1:27 PM 07/03/2023    2:48 PM 06/05/2023   10:12 AM  Readmission Risk Prevention Plan  Transportation Screening Complete Complete Complete  PCP or Specialist Appt within 5-7 Days Complete Complete   PCP or Specialist Appt within 3-5 Days   Complete  Home Care Screening Complete Complete   Medication Review (RN CM) Complete Complete   HRI or Home Care Consult   Complete  Social Work Consult for Recovery Care Planning/Counseling   Complete  Palliative Care Screening   Complete  Medication Review Oceanographer)   Complete

## 2023-07-17 NOTE — Progress Notes (Signed)
Date and time results received: 07/17/23 1156 (use smartphrase ".now" to insert current time)  Test: Venous oxygen level  Critical Value: 31  Name of Provider Notified: Dr. Isidoro Donning   Orders Received? Or Actions Taken?:  MD aware

## 2023-07-17 NOTE — Progress Notes (Signed)
Occupational Therapy Treatment Patient Details Name: Calvin Escobar MRN: [id number] DOB: 1950-11-07 Today's Date: 07/17/2023   History of present illness Pt is a 73 y/o M presenting to ED on 2/7 with AMS, admitted for acute metabolic encephalopathy from AKI, sepsis from possible UTI. Recent admission for pseudomonas UTI presenting with dysuria/penile pain. PMH includes dementia, HLD, depression, GERD, BPH, anemia of chronic disease associated hgb 7   OT comments  Pt more lethargic this AM, requiring sternal rub to awaken/remain alert. Pt able to stand x2 and take side steps toward [facility] to get more upright to eat breakfast. Pt min A for bed mobility and mod A for transfers with RW. Pt with posterior lean. Pt keeping eyes closed frequently during session, states it feels "hard to wake up". RN present at end of session, notified. Pt presenting with impairments listed below, will follow acutely. Patient will benefit from continued inpatient follow up therapy, <3 hours/day to maximize safety/ind with ADL/functional mobility.       If plan is discharge home, recommend the following:  A lot of help with walking and/or transfers;A lot of help with bathing/dressing/bathroom;Assistance with cooking/housework;Direct supervision/assist for medications management;Assist for transportation;Direct supervision/assist for financial management;Help with stairs or ramp for entrance   Equipment Recommendations  Other (comment) (defer)    Recommendations for Other Services PT consult    Precautions / Restrictions Precautions Precautions: Fall Precaution/Restrictions Comments: chronic foley Restrictions Weight Bearing Restrictions Per Provider Order: No       Mobility Bed Mobility Overal bed mobility: Needs Assistance Bed Mobility: Supine to Sit, Sit to Supine, Rolling Rolling: Min assist, Used rails   Supine to sit: Min assist Sit to supine: Min assist        Transfers Overall transfer level:  Needs assistance Equipment used: Rolling walker (2 wheels) Transfers: Sit to/from Stand Sit to Stand: Mod assist           General transfer comment: mod A to stand with RW, posteiror lean, braces legs against the back of the bed     Balance Overall balance assessment: Needs assistance Sitting-balance support: Feet supported Sitting balance-Leahy Scale: Fair     Standing balance support: During functional activity, Bilateral upper extremity supported, Reliant on assistive device for balance Standing balance-Leahy Scale: Poor Standing balance comment: reliant on RW support, post lean                           ADL either performed or assessed with clinical judgement   ADL Overall ADL's : Needs assistance/impaired                         Toilet Transfer: Moderate assistance;Rolling walker (2 wheels)   Toileting- Clothing Manipulation and Hygiene: Total assistance Toileting - Clothing Manipulation Details (indicate cue type and reason): catheter + incontinence     Functional mobility during ADLs: Moderate assistance;Rolling walker (2 wheels)      Extremity/Trunk Assessment Upper Extremity Assessment Upper Extremity Assessment: Generalized weakness   Lower Extremity Assessment Lower Extremity Assessment: Generalized weakness        Vision   Vision Assessment?: No apparent visual deficits   Perception Perception Perception: Not tested   Praxis Praxis Praxis: Not tested   Communication Communication Communication: Impaired Factors Affecting Communication: Reduced clarity of speech   Cognition Arousal: Lethargic Behavior During Therapy: WFL for tasks assessed/performed Cognition: History of cognitive impairments  OT - Cognition Comments: hx of dementia, pt very sleepy/lethargic this AM, keeping eyes closed. States he feels like he "can't wake up" requires sternal rub to awaken                 Following commands:  Impaired Following commands impaired: Follows one step commands inconsistently      Cueing   Cueing Techniques: Verbal cues, Tactile cues  Exercises      Shoulder Instructions       General Comments VSS    Pertinent Vitals/ Pain       Pain Assessment Pain Assessment: No/denies pain  Home Living                                          Prior Functioning/Environment              Frequency  Min 1X/week        Progress Toward Goals  OT Goals(current goals can now be found in the care plan section)  Progress towards OT goals: Progressing toward goals  Acute Rehab OT Goals Patient Stated Goal: none stated OT Goal Formulation: With patient Time For Goal Achievement: 07/27/23 Potential to Achieve Goals: Good ADL Goals Pt Will Perform Upper Body Dressing: with supervision;sitting Pt Will Perform Lower Body Dressing: with supervision;sit to/from stand;sitting/lateral leans Pt Will Transfer to Toilet: with supervision;ambulating;regular height toilet Additional ADL Goal #1: pt will perform bed mobility with supervision in prep for ADLs  Plan      Co-evaluation                 AM-PAC OT "6 Clicks" Daily Activity     Outcome Measure   Help from another person eating meals?: A Little Help from another person taking care of personal grooming?: A Little Help from another person toileting, which includes using toliet, bedpan, or urinal?: A Little Help from another person bathing (including washing, rinsing, drying)?: A Lot Help from another person to put on and taking off regular upper body clothing?: A Little Help from another person to put on and taking off regular lower body clothing?: A Lot 6 Click Score: 16    End of Session Equipment Utilized During Treatment: Gait belt;Rolling walker (2 wheels)  OT Visit Diagnosis: Unsteadiness on feet (R26.81);Other abnormalities of gait and mobility (R26.89);Repeated falls (R29.6);Muscle weakness  (generalized) (M62.81);Pain   Activity Tolerance Patient tolerated treatment well   Patient Left in bed;with call bell/phone within reach;with bed alarm set;with restraints reapplied;with nursing/sitter in room   Nurse Communication Mobility status        Time: 9562-1308 OT Time Calculation (min): 19 min  Charges: OT General Charges $OT Visit: 1 Visit OT Treatments $Self Care/Home Management : 8-22 mins  Carver Fila, OTD, OTR/L SecureChat Preferred Acute Rehab [phone]   Carver Fila Koonce 07/17/2023, 8:46 AM

## 2023-07-17 NOTE — Progress Notes (Addendum)
[facility]                                                                              Calvin Escobar, is a 73 y.o. male, DOB - 12-Mar-1951, ZOX:[id number] Admit date - 07/12/2023    Outpatient Primary MD for the patient is Calvin Olszewski, MD  LOS - 5  days  Chief Complaint  Patient presents with   Penis Pain       Brief summary   Calvin Escobar is a 73 y.o. male with medical history significant for dementia, hyperlipidemia, depression, GERD, BPH, anemia of chronic disease associated baseline hemoglobin 7, who is admitted to [facility] on 07/12/2023 with suspected acute metabolic encephalopathy after presenting from home to [facility] ED for evaluation of altered mental status.     Assessment & Plan    Principal Problem:  Acute metabolic encephalopathy from from AKI, and dehydration.  Creatinine improved from 1.4 to 1.2 to 1.02 -Still very confused, lethargic, oriented to self, and restraints -Obtain ABG, TSH, ammonia level, anemia panel, CT head for further workup -Narcan 0.4 mg x 1  Sepsis from UTI:  -Met sepsis criteria on admission with fevers 101.6 F, tachycardia, tachypnea, leukocytosis.  UA suggestive of UTI.   -Blood cultures are negative so far.  - Urine cultures grew > 100,000 colonies yeast and 20,000 colonies of enterococcus.  - Patient received 3 days of IV cefepime as he had a h/o pseudomonas UTI, recently treated with Levaquin.  -Placed on Diflucan 200 mg daily   Acute metabolic encephalopathy, superimposed on dementia  -This morning noted to be very confused, somnolent and lethargic, oriented to self only, in restraints -Patient is noted on several medications at bedtime -Will change Neurontin to 100 mg 3 times daily, trazodone as needed for insomnia.  C. difficile diarrhea/enteritis -C. difficile positive on 2/8 -Continue oral vancomycin Diarrhea improving   GERD Stable.      Anemia of chronic disease/ iron deficiency anemia,  B12 borderline low/deficiency Hemoglobin at baseline around 8.  Iron supplementation on discharge.  Vit b12 low normal.  -Continue B12 replacement     Hyponatremia:  Resolved.        BPH Continue with flomax.      Hyperlipidemia Continue with pravastatin.   AKI Resolved.      Hypokalemia:  Acute metabolic encephalopathy from from AKI, and dehydration.  Creatinine improved from 1.4 to 1.2 to 1.02 Pt is more alert and awake and oriented to place and person.        Sepsis from possible UTI:  Pt febrile of 101.6, tachycardic, tachypnea of 23/min and with leukocytosis of 18,500,  UA suggestive of possible UTI, with large leukocytes and few bacteria.  Blood cultures are negative so far.  Urine cultures grew yeast and 20,000 colonies of enterococcus.  Patient received 3 days of IV cefepime as he had a h/o pseudomonas UTI recently treated with Levaquin.  Ordered a dose of diflucan 200 mg .    Dementia  Agitated overnight, requiring IV haldol.  Patient is on Mirtazapine, trazodone 300 mg at bedtime. Which were restarted.  GERD Stable.      Anemia of chronic disease/ iron deficiency anemia Hemoglobin at baseline around 8.  Iron supplementation on discharge.  Vit b12 low normal. Pt having diarrhea, will give a dose of IM vit b12.      Hyponatremia:  Presented with sodium of 131  -resolved.       BPH Continue with flomax.      Hyperlipidemia Continue with pravastatin.      AKI -Presented with creatinine of 1.44 - resolved.      Hypokalemia:  Replace as needed   Generalized deconditioning/debility -PT evaluation recommended SNF   Estimated body mass index is 29.64 kg/m as calculated from the following:   Height as of this encounter: 6\' 1"  (1.854 m).   Weight as of this encounter: 101.9 kg.  Code Status: DNR DVT Prophylaxis:  SCDs Start: 07/13/23 0008   Level of Care: Level of care: Telemetry Medical Family Communication:  Disposition  Plan:      Remains inpatient appropriate:      Procedures:    Consultants:     Antimicrobials:   Anti-infectives (From admission, onward)    Start     Dose/Rate Route Frequency Ordered Stop   07/15/23 1100  fluconazole (DIFLUCAN) tablet 200 mg        200 mg Oral  Once 07/15/23 1003 07/15/23 1139   07/13/23 1400  vancomycin (VANCOCIN) capsule 125 mg        125 mg Oral 4 times daily 07/13/23 1139 07/23/23 1359   07/12/23 2330  ceFEPIme (MAXIPIME) 2 g in sodium chloride 0.9 % 100 mL IVPB  Status:  Discontinued        2 g 200 mL/hr over 30 Minutes Intravenous Every 24 hours 07/12/23 2326 07/15/23 1014   07/12/23 2315  levofloxacin (LEVAQUIN) IVPB 750 mg  Status:  Discontinued        750 mg 100 mL/hr over 90 Minutes Intravenous  Once 07/12/23 2313 07/12/23 2326   07/12/23 2145  cefTRIAXone (ROCEPHIN) 1 g in sodium chloride 0.9 % 100 mL IVPB        1 g 200 mL/hr over 30 Minutes Intravenous  Once 07/12/23 2133 07/12/23 2331          Medications  ARIPiprazole  2 mg Oral Daily   busPIRone  10 mg Oral TID   Chlorhexidine Gluconate Cloth  6 each Topical Daily   divalproex  500 mg Oral QHS   ferrous sulfate  325 mg Oral BID WC   gabapentin  300 mg Oral QHS   Gerhardt's butt cream   Topical TID   midodrine  5 mg Oral TID WC   mirtazapine  30 mg Oral QHS   naLOXone (NARCAN)  injection  0.4 mg Intravenous Once   pantoprazole  40 mg Oral QHS   pravastatin  20 mg Oral QHS   primidone  50 mg Oral BID   tamsulosin  0.4 mg Oral Daily   vancomycin  125 mg Oral QID   venlafaxine XR  300 mg Oral Q breakfast   vitamin B-12  500 mcg Oral Daily      Subjective:   Calvin Escobar was seen and examined today.  Very confused, lethargic and somnolent, and restraints.  Difficult to obtain ROS from the patient due to mental status, overnight no acute issues, no fevers  Objective:   Vitals:   07/16/23 2215 07/17/23 0500 07/17/23 0633 07/17/23 0840  BP: 119/75  (!) 123/96 125/75  Pulse: 91  97 100  Resp: 18  18   Temp: 98.1 F (36.7 C)  98.3 F (36.8 C)   TempSrc: Oral     SpO2: 96%  96% 92%  Weight:  101.9 kg    Height:        Intake/Output Summary (Last 24 hours) at 07/17/2023 1046 Last data filed at 07/17/2023 0948 Gross per 24 hour  Intake 490 ml  Output 2650 ml  Net -2160 ml     Wt Readings from Last 3 Encounters:  07/17/23 101.9 kg  07/12/23 101.6 kg  07/04/23 104.4 kg     Exam General: Somnolent, lethargic, oriented to self Cardiovascular: S1 S2 auscultated,  RRR Respiratory: Clear to auscultation bilaterally, no wheezing Gastrointestinal: Soft, nontender, nondistended, + bowel sounds Ext: no pedal edema bilaterally Neuro: not following commands Psych: confused, somnolent and lethargic    Data Reviewed:  I have personally reviewed following labs    CBC Lab Results  Component Value Date   WBC 8.7 07/16/2023   RBC 3.23 (L) 07/16/2023   HGB 8.6 (L) 07/16/2023   HCT 27.8 (L) 07/16/2023   MCV 86.1 07/16/2023   MCH 26.6 07/16/2023   PLT 268 07/16/2023   MCHC 30.9 07/16/2023   RDW 15.7 (H) 07/16/2023   LYMPHSABS 1.5 07/16/2023   MONOABS 1.1 (H) 07/16/2023   EOSABS 0.3 07/16/2023   BASOSABS 0.2 (H) 07/16/2023     Last metabolic panel Lab Results  Component Value Date   NA 137 07/16/2023   K 3.5 07/16/2023   CL 101 07/16/2023   CO2 23 07/16/2023   BUN 7 (L) 07/16/2023   CREATININE 1.09 07/16/2023   GLUCOSE 89 07/16/2023   GFRNONAA >60 07/16/2023   GFRAA 67 11/13/2019   CALCIUM 8.6 (L) 07/16/2023   PHOS 3.9 07/05/2023   PROT 6.6 07/15/2023   ALBUMIN 2.4 (L) 07/15/2023   LABGLOB 2.8 10/28/2019   AGRATIO 1.5 10/28/2019   BILITOT 0.3 07/15/2023   ALKPHOS 52 07/15/2023   AST 24 07/15/2023   ALT 12 07/15/2023   ANIONGAP 13 07/16/2023    CBG (last 3)  Recent Labs    07/15/23 2043  GLUCAP 97      Coagulation Profile: Recent Labs  Lab 07/14/23 0217  INR 1.3*     Radiology Studies: I have personally reviewed the  imaging studies  No results found.     Thad Ranger M.D. [facility] 07/17/2023, 10:46 AM  Available via Epic secure chat 7am-7pm After 7 pm, please refer to night coverage provider listed on amion.

## 2023-07-18 DIAGNOSIS — G9341 Metabolic encephalopathy: Secondary | ICD-10-CM | POA: Diagnosis not present

## 2023-07-18 DIAGNOSIS — N289 Disorder of kidney and ureter, unspecified: Secondary | ICD-10-CM | POA: Diagnosis not present

## 2023-07-18 DIAGNOSIS — N3000 Acute cystitis without hematuria: Secondary | ICD-10-CM | POA: Diagnosis not present

## 2023-07-18 DIAGNOSIS — G9340 Encephalopathy, unspecified: Secondary | ICD-10-CM | POA: Diagnosis not present

## 2023-07-18 LAB — COMPREHENSIVE METABOLIC PANEL
ALT: 12 U/L (ref 0–44)
AST: 25 U/L (ref 15–41)
Albumin: 2.3 g/dL — ABNORMAL LOW (ref 3.5–5.0)
Alkaline Phosphatase: 50 U/L (ref 38–126)
Anion gap: 10 (ref 5–15)
BUN: 8 mg/dL (ref 8–23)
CO2: 24 mmol/L (ref 22–32)
Calcium: 8.6 mg/dL — ABNORMAL LOW (ref 8.9–10.3)
Chloride: 103 mmol/L (ref 98–111)
Creatinine, Ser: 1.23 mg/dL (ref 0.61–1.24)
GFR, Estimated: >60 mL/min (ref >60)
Glucose, Bld: 126 mg/dL — ABNORMAL HIGH (ref 70–99)
Potassium: 4.1 mmol/L (ref 3.5–5.1)
Sodium: 137 mmol/L (ref 135–145)
Total Bilirubin: 0.3 mg/dL (ref 0.0–1.2)
Total Protein: 6.8 g/dL (ref 6.5–8.1)

## 2023-07-18 LAB — CBC
HCT: 30.6 % — ABNORMAL LOW (ref 39.0–52.0)
Hemoglobin: 9.4 g/dL — ABNORMAL LOW (ref 13.0–17.0)
MCH: 27 pg (ref 26.0–34.0)
MCHC: 30.7 g/dL (ref 30.0–36.0)
MCV: 87.9 fL (ref 80.0–100.0)
Platelets: 324 10*3/uL (ref 150–400)
RBC: 3.48 MIL/uL — ABNORMAL LOW (ref 4.22–5.81)
RDW: 16.1 % — ABNORMAL HIGH (ref 11.5–15.5)
WBC: 7.4 10*3/uL (ref 4.0–10.5)
nRBC: 0 % (ref 0.0–0.2)

## 2023-07-18 LAB — CULTURE, BLOOD (ROUTINE X 2)
Culture: NO GROWTH
Culture: NO GROWTH
Special Requests: ADEQUATE
Special Requests: ADEQUATE

## 2023-07-18 NOTE — Progress Notes (Signed)
Physical Therapy Treatment Patient Details Name: Calvin Escobar MRN: [id number] DOB: 08/29/1950 Today's Date: 07/18/2023   History of Present Illness Pt is a 73 y/o M presenting to ED on 2/7 with AMS, admitted for acute metabolic encephalopathy from AKI, sepsis from possible UTI. Recent admission for pseudomonas UTI presenting with dysuria/penile pain. PMH includes dementia, HLD, depression, GERD, BPH, anemia of chronic disease associated hgb 7    PT Comments  Patient resting in bed and eager to mobilize with therapy, pt pleasant and cooperative today. Pt followed simple commands ~50% of time. Pt performed supine<>sit with CGA for safety and assist to manage foley line. Min assist required for sit<>stand from elevated EOB and to ambulate short bout in room with RW; pt required cues to maintain safe position to walker, posture, and assist to guide direction. Pt limited by bowel incontinence and max assist provided for pericare and gown change. Pt performed repeated sit<>stands for functional transfer training and LE strengthening. EOS pt returned to supine in bed, Alarm on and call bell within reach. Will continue to progress pt as able. Patient will benefit from continued inpatient follow up therapy, <3 hours/day.    If plan is discharge home, recommend the following: A little help with walking and/or transfers;A little help with bathing/dressing/bathroom;Assistance with cooking/housework;Direct supervision/assist for medications management;Assist for transportation;Help with stairs or ramp for entrance   Can travel by private vehicle     No  Equipment Recommendations  Rolling walker (2 wheels)    Recommendations for Other Services       Precautions / Restrictions Precautions Precautions: Fall Precaution/Restrictions Comments: chronic foley Restrictions Weight Bearing Restrictions Per Provider Order: No     Mobility  Bed Mobility Overal bed mobility: Needs Assistance Bed  Mobility: Supine to Sit, Sit to Supine     Supine to sit: Contact guard, HOB elevated, Used rails Sit to supine: Contact guard assist, HOB elevated, Used rails   General bed mobility comments: CGA for safety, pt taking extra time and use of bed features.    Transfers Overall transfer level: Needs assistance Equipment used: Rolling walker (2 wheels) Transfers: Sit to/from Stand Sit to Stand: Min assist, From elevated surface           General transfer comment: cues for hand placement with RW, min assist for sit<>stand from EOB    Ambulation/Gait Ambulation/Gait assistance: Min assist Gait Distance (Feet): 20 Feet Assistive device: Rolling walker (2 wheels) Gait Pattern/deviations: Decreased step length - right, Decreased step length - left, Decreased stride length, Trunk flexed, Shuffle Gait velocity: decr     General Gait Details: min assist to steady and manage RW, pt with small BM during ambulation and passing gas.   Stairs             Wheelchair Mobility     Tilt Bed    Modified Rankin (Stroke Patients Only)       Balance Overall balance assessment: Needs assistance Sitting-balance support: Feet supported Sitting balance-Leahy Scale: Fair     Standing balance support: During functional activity, Bilateral upper extremity supported, Reliant on assistive device for balance Standing balance-Leahy Scale: Poor Standing balance comment: reliant on RW support, post lean                            Communication Communication Communication: No apparent difficulties  Cognition Arousal: Alert Behavior During Therapy: WFL for tasks assessed/performed   PT - Cognitive impairments: History of  cognitive impairments                       PT - Cognition Comments: pt alert, engaged, and pleasant this visit. Pt focusing on need to mobilize and get stronger. Following commands: Impaired Following commands impaired: Follows one step commands  inconsistently    Cueing Cueing Techniques: Verbal cues, Tactile cues  Exercises General Exercises - Lower Extremity Ankle Circles/Pumps: AROM, Both, 10 reps, Supine Long Arc Quad: AROM, Both, 10 reps, Seated Hip ABduction/ADduction: AROM, Both, 5 reps, Supine    General Comments        Pertinent Vitals/Pain Pain Assessment Pain Assessment: No/denies pain Breathing: normal Negative Vocalization: none Facial Expression: smiling or inexpressive Body Language: relaxed Consolability: no need to console PAINAD Score: 0 Pain Intervention(s): Monitored during session, Limited activity within patient's tolerance, Repositioned    Home Living                          Prior Function            PT Goals (current goals can now be found in the care plan section) Acute Rehab PT Goals Patient Stated Goal: to get better and get out of here PT Goal Formulation: With patient Time For Goal Achievement: 07/28/23 Potential to Achieve Goals: Good Progress towards PT goals: Progressing toward goals    Frequency    Min 1X/week      PT Plan      Co-evaluation              AM-PAC PT "6 Clicks" Mobility   Outcome Measure  Help needed turning from your back to your side while in a flat bed without using bedrails?: None Help needed moving from lying on your back to sitting on the side of a flat bed without using bedrails?: A Little Help needed moving to and from a bed to a chair (including a wheelchair)?: A Little Help needed standing up from a chair using your arms (e.g., wheelchair or bedside chair)?: A Little Help needed to walk in hospital room?: A Lot Help needed climbing 3-5 steps with a railing? : Total 6 Click Score: 16    End of Session Equipment Utilized During Treatment: Gait belt Activity Tolerance: Patient tolerated treatment well Patient left: in bed;with call bell/phone within reach;with bed alarm set (telesitter) Nurse Communication: Mobility  status PT Visit Diagnosis: Other abnormalities of gait and mobility (R26.89);Muscle weakness (generalized) (M62.81)     Time: 1610-9604 PT Time Calculation (min) (ACUTE ONLY): 35 min  Charges:    $Gait Training: 8-22 mins $Therapeutic Exercise: 8-22 mins PT General Charges $$ ACUTE PT VISIT: 1 Visit                     Wynn Maudlin, DPT Acute Rehabilitation Services Office [phone]  07/18/23 4:35 PM

## 2023-07-18 NOTE — Plan of Care (Signed)

## 2023-07-18 NOTE — Progress Notes (Signed)
[facility]                                                                              Calvin Escobar, is a 73 y.o. male, DOB - [birth year], WUJ:[id number] Admit date - 07/12/2023    Outpatient Primary MD for the patient is Calvin Olszewski, MD  LOS - 6  days  Chief Complaint  Patient presents with   Penis Pain       Brief summary   Calvin Escobar is a 73 y.o. male with medical history significant for dementia, hyperlipidemia, depression, GERD, BPH, anemia of chronic disease associated baseline hemoglobin 7, who is admitted to [facility] on 07/12/2023 with suspected acute metabolic encephalopathy after presenting from home to [facility] ED for evaluation of altered mental status.     Assessment & Plan    Principal Problem:  Acute metabolic encephalopathy from from AKI, and dehydration.  Creatinine improved from 1.4 to 1.2 to 1.02 -Much improved today alert and oriented. -CT head showed no acute intracranial abnormality  Sepsis from UTI:  -Met sepsis criteria on admission with fevers 101.6 F, tachycardia, tachypnea, leukocytosis.  UA suggestive of UTI.   -Blood cultures are negative so far.  - Urine cultures grew > 100,000 colonies yeast and 20,000 colonies of enterococcus.  - Patient received 3 days of IV cefepime as he had a h/o pseudomonas UTI, recently treated with Levaquin.  -Continue Diflucan 200 mg daily for 10-14 days    Acute metabolic encephalopathy, superimposed on dementia  -Alert and oriented today.   -Changed trazodone to as needed, Neurontin 100 mg 3 times daily   C. difficile diarrhea/enteritis -C. difficile positive on 2/8 -Continue oral vancomycin -Diarrhea improving   GERD Stable.      Anemia of chronic disease/ iron deficiency anemia, B12 borderline low/deficiency Hemoglobin at baseline around 8.  Iron supplementation on discharge.  Vit b12 low normal.  -Continue B12 replacement.  H&H stable    Hyponatremia:  Resolved.        BPH Continue with flomax.     Hyperlipidemia Continue with pravastatin.   AKI Resolved.     Hypokalemia:  Acute metabolic encephalopathy from from AKI, and dehydration.  Creatinine improved from 1.4 to 1.2 Pt is more alert and awake and oriented to place and person.      GERD Stable.      Anemia of chronic disease/ iron deficiency anemia Hemoglobin at baseline around 8.  Iron supplementation on discharge.  Vit b12 low normal. Pt having diarrhea, will give a dose of IM vit b12.      Hyponatremia:  Presented with sodium of 131  -resolved.       BPH Continue with flomax.      Hyperlipidemia Continue with pravastatin.      AKI -Presented with creatinine of 1.44 - resolved.      Hypokalemia:  Replace as needed   Generalized deconditioning/debility -PT evaluation recommended SNF   Estimated body mass index is 29.64 kg/m as calculated from the following:   Height as of this encounter: 6\' 1"  (1.854 m).   Weight as of this encounter: 101.9  kg.  Code Status: DNR DVT Prophylaxis:  SCDs Start: 07/13/23 0008   Level of Care: Level of care: Telemetry Medical Family Communication:  Disposition Plan:      Remains inpatient appropriate:      Procedures:    Consultants:     Antimicrobials:   Anti-infectives (From admission, onward)    Start     Dose/Rate Route Frequency Ordered Stop   07/17/23 1145  fluconazole (DIFLUCAN) IVPB 200 mg        200 mg 100 mL/hr over 60 Minutes Intravenous Every 24 hours 07/17/23 1052     07/15/23 1100  fluconazole (DIFLUCAN) tablet 200 mg        200 mg Oral  Once 07/15/23 1003 07/15/23 1139   07/13/23 1400  vancomycin (VANCOCIN) capsule 125 mg        125 mg Oral 4 times daily 07/13/23 1139 07/23/23 1359   07/12/23 2330  ceFEPIme (MAXIPIME) 2 g in sodium chloride 0.9 % 100 mL IVPB  Status:  Discontinued        2 g 200 mL/hr over 30 Minutes Intravenous Every 24 hours 07/12/23 2326 07/15/23 1014   07/12/23 2315   levofloxacin (LEVAQUIN) IVPB 750 mg  Status:  Discontinued        750 mg 100 mL/hr over 90 Minutes Intravenous  Once 07/12/23 2313 07/12/23 2326   07/12/23 2145  cefTRIAXone (ROCEPHIN) 1 g in sodium chloride 0.9 % 100 mL IVPB        1 g 200 mL/hr over 30 Minutes Intravenous  Once 07/12/23 2133 07/12/23 2331          Medications  ARIPiprazole  2 mg Oral Daily   busPIRone  10 mg Oral TID   Chlorhexidine Gluconate Cloth  6 each Topical Daily   divalproex  500 mg Oral QHS   ferrous sulfate  325 mg Oral BID WC   gabapentin  100 mg Oral TID   Gerhardt's butt cream   Topical TID   midodrine  5 mg Oral TID WC   mirtazapine  30 mg Oral QHS   pantoprazole  40 mg Oral QHS   pravastatin  20 mg Oral QHS   primidone  50 mg Oral BID   tamsulosin  0.4 mg Oral Daily   vancomycin  125 mg Oral QID   venlafaxine XR  300 mg Oral Q breakfast   vitamin B-12  500 mcg Oral Daily      Subjective:   Calvin Escobar was seen and examined today.  Much more alert and oriented today, feels close to his baseline.  Eating breakfast without any difficulty.  No acute issues overnight.    Objective:   Vitals:   07/17/23 1141 07/17/23 1700 07/18/23 0557 07/18/23 0807  BP:  127/68 130/82 134/85  Pulse:  82 100 (!) 102  Resp:   18 18  Temp: 97.6 F (36.4 C) (!) 97 F (36.1 C) (!) 97.4 F (36.3 C)   TempSrc: Oral Axillary Oral   SpO2:  94% 97%   Weight:      Height:        Intake/Output Summary (Last 24 hours) at 07/18/2023 1320 Last data filed at 07/18/2023 0550 Gross per 24 hour  Intake 220.03 ml  Output 1300 ml  Net -1079.97 ml     Wt Readings from Last 3 Encounters:  07/17/23 101.9 kg  07/12/23 101.6 kg  07/04/23 104.4 kg   Physical Exam General: Alert and oriented x 3, NAD Cardiovascular: S1  S2 clear, RRR.  Respiratory: CTAB, no wheezing Gastrointestinal: Soft, nontender, nondistended, NBS Ext: no pedal edema bilaterally Neuro: no new deficits Psych: Normal affect    Data  Reviewed:  I have personally reviewed following labs    CBC Lab Results  Component Value Date   WBC 7.4 07/18/2023   RBC 3.48 (L) 07/18/2023   HGB 9.4 (L) 07/18/2023   HCT 30.6 (L) 07/18/2023   MCV 87.9 07/18/2023   MCH 27.0 07/18/2023   PLT 324 07/18/2023   MCHC 30.7 07/18/2023   RDW 16.1 (H) 07/18/2023   LYMPHSABS 1.5 07/16/2023   MONOABS 1.1 (H) 07/16/2023   EOSABS 0.3 07/16/2023   BASOSABS 0.2 (H) 07/16/2023     Last metabolic panel Lab Results  Component Value Date   NA 137 07/18/2023   K 4.1 07/18/2023   CL 103 07/18/2023   CO2 24 07/18/2023   BUN 8 07/18/2023   CREATININE 1.23 07/18/2023   GLUCOSE 126 (H) 07/18/2023   GFRNONAA >60 07/18/2023   GFRAA 67 11/13/2019   CALCIUM 8.6 (L) 07/18/2023   PHOS 3.9 07/05/2023   PROT 6.8 07/18/2023   ALBUMIN 2.3 (L) 07/18/2023   LABGLOB 2.8 10/28/2019   AGRATIO 1.5 10/28/2019   BILITOT 0.3 07/18/2023   ALKPHOS 50 07/18/2023   AST 25 07/18/2023   ALT 12 07/18/2023   ANIONGAP 10 07/18/2023    CBG (last 3)  Recent Labs    07/15/23 2043 07/17/23 1128  GLUCAP 97 85      Coagulation Profile: Recent Labs  Lab 07/14/23 0217  INR 1.3*     Radiology Studies: I have personally reviewed the imaging studies  CT HEAD WO CONTRAST ( ) Result Date: 07/17/2023 CLINICAL DATA:  Mental status change, unknown cause EXAM: CT HEAD WITHOUT CONTRAST TECHNIQUE: Contiguous axial images were obtained from the base of the skull through the vertex without intravenous contrast. RADIATION DOSE REDUCTION: This exam was performed according to the departmental dose-optimization program which includes automated exposure control, adjustment of the mA and/or kV according to patient size and/or use of iterative reconstruction technique. COMPARISON:  CT head 07/03/2023. FINDINGS: Brain: No evidence of acute infarction, hemorrhage, hydrocephalus, extra-axial collection or mass lesion/mass effect. Vascular: No hyperdense vessel. Skull: No acute  fracture. Sinuses/Orbits: Clear sinuses.  No acute orbital findings. Other: No mastoid effusions. IMPRESSION: No evidence of acute intracranial abnormality. Electronically Signed   By: Feliberto Harts M.D.   On: 07/17/2023 19:07       Jadarius Commons M.D. [facility] 07/18/2023, 1:20 PM  Available via Epic secure chat 7am-7pm After 7 pm, please refer to night coverage provider listed on amion.

## 2023-07-18 NOTE — TOC Progression Note (Signed)
Transition of Care [facility]) - Progression Note    Patient Details  Name: Calvin Escobar MRN: [id number] Date of Birth: February 25, 1951  Transition of Care [facility] CM/SW Contact  Erin Sons, [place] Phone Number: 07/18/2023, 3:20 PM  Clinical Narrative:     CSW confirmed choice of [facility] with pt's spouse. CSW initiated SNF auth request in online portal. Auth ID# [id number] Berkley Harvey is pending.   Expected Discharge Plan: Skilled Nursing Facility Barriers to Discharge: Continued Medical Work up, English as a second language teacher  Expected Discharge Plan and Services       Living arrangements for the past 2 months: Single Family Home                                       Social Determinants of Health (SDOH) Interventions SDOH Screenings   Food Insecurity: No Food Insecurity (07/13/2023)  Housing: Low Risk  (07/13/2023)  Transportation Needs: No Transportation Needs (07/13/2023)  Utilities: Not At Risk (07/13/2023)  Depression (PHQ2-9): Low Risk  (03/07/2023)  Recent Concern: Depression (PHQ2-9) - High Risk (01/15/2023)  Financial Resource Strain: Low Risk  (10/03/2022)  Physical Activity: Inactive (05/04/2022)  Social Connections: Moderately Isolated (07/13/2023)  Stress: Stress Concern Present (11/30/2022)  Tobacco Use: Low Risk  (07/13/2023)    Readmission Risk Interventions    07/04/2023    1:27 PM 07/03/2023    2:48 PM 06/05/2023   10:12 AM  Readmission Risk Prevention Plan  Transportation Screening Complete Complete Complete  PCP or Specialist Appt within 5-7 Days Complete Complete   PCP or Specialist Appt within 3-5 Days   Complete  Home Care Screening Complete Complete   Medication Review (RN CM) Complete Complete   HRI or Home Care Consult   Complete  Social Work Consult for Recovery Care Planning/Counseling   Complete  Palliative Care Screening   Complete  Medication Review Oceanographer)   Complete

## 2023-07-19 DIAGNOSIS — E871 Hypo-osmolality and hyponatremia: Secondary | ICD-10-CM | POA: Diagnosis not present

## 2023-07-19 DIAGNOSIS — N390 Urinary tract infection, site not specified: Secondary | ICD-10-CM | POA: Diagnosis not present

## 2023-07-19 DIAGNOSIS — T839XXD Unspecified complication of genitourinary prosthetic device, implant and graft, subsequent encounter: Secondary | ICD-10-CM | POA: Diagnosis not present

## 2023-07-19 DIAGNOSIS — E8729 Other acidosis: Secondary | ICD-10-CM | POA: Diagnosis not present

## 2023-07-19 DIAGNOSIS — R509 Fever, unspecified: Secondary | ICD-10-CM | POA: Diagnosis not present

## 2023-07-19 DIAGNOSIS — G9340 Encephalopathy, unspecified: Secondary | ICD-10-CM | POA: Diagnosis not present

## 2023-07-19 DIAGNOSIS — Z7401 Bed confinement status: Secondary | ICD-10-CM | POA: Diagnosis not present

## 2023-07-19 DIAGNOSIS — K219 Gastro-esophageal reflux disease without esophagitis: Secondary | ICD-10-CM | POA: Diagnosis not present

## 2023-07-19 DIAGNOSIS — N400 Enlarged prostate without lower urinary tract symptoms: Secondary | ICD-10-CM | POA: Diagnosis not present

## 2023-07-19 DIAGNOSIS — N289 Disorder of kidney and ureter, unspecified: Secondary | ICD-10-CM | POA: Diagnosis not present

## 2023-07-19 DIAGNOSIS — R6520 Severe sepsis without septic shock: Secondary | ICD-10-CM | POA: Diagnosis not present

## 2023-07-19 DIAGNOSIS — R531 Weakness: Secondary | ICD-10-CM | POA: Diagnosis not present

## 2023-07-19 DIAGNOSIS — N3000 Acute cystitis without hematuria: Secondary | ICD-10-CM | POA: Diagnosis not present

## 2023-07-19 DIAGNOSIS — D638 Anemia in other chronic diseases classified elsewhere: Secondary | ICD-10-CM | POA: Diagnosis not present

## 2023-07-19 DIAGNOSIS — R8290 Unspecified abnormal findings in urine: Secondary | ICD-10-CM | POA: Diagnosis not present

## 2023-07-19 DIAGNOSIS — A419 Sepsis, unspecified organism: Secondary | ICD-10-CM | POA: Diagnosis not present

## 2023-07-19 DIAGNOSIS — E785 Hyperlipidemia, unspecified: Secondary | ICD-10-CM | POA: Diagnosis not present

## 2023-07-19 DIAGNOSIS — G9341 Metabolic encephalopathy: Secondary | ICD-10-CM | POA: Diagnosis not present

## 2023-07-19 LAB — BASIC METABOLIC PANEL
Anion gap: 10 (ref 5–15)
BUN: 7 mg/dL — ABNORMAL LOW (ref 8–23)
CO2: 24 mmol/L (ref 22–32)
Calcium: 8.3 mg/dL — ABNORMAL LOW (ref 8.9–10.3)
Chloride: 101 mmol/L (ref 98–111)
Creatinine, Ser: 1.17 mg/dL (ref 0.61–1.24)
GFR, Estimated: >60 mL/min (ref >60)
Glucose, Bld: 96 mg/dL (ref 70–99)
Potassium: 4.1 mmol/L (ref 3.5–5.1)
Sodium: 135 mmol/L (ref 135–145)

## 2023-07-19 LAB — CBC
HCT: 31.2 % — ABNORMAL LOW (ref 39.0–52.0)
Hemoglobin: 9.4 g/dL — ABNORMAL LOW (ref 13.0–17.0)
MCH: 26.3 pg (ref 26.0–34.0)
MCHC: 30.1 g/dL (ref 30.0–36.0)
MCV: 87.4 fL (ref 80.0–100.0)
Platelets: 301 10*3/uL (ref 150–400)
RBC: 3.57 MIL/uL — ABNORMAL LOW (ref 4.22–5.81)
RDW: 16 % — ABNORMAL HIGH (ref 11.5–15.5)
WBC: 7.8 10*3/uL (ref 4.0–10.5)
nRBC: 0 % (ref 0.0–0.2)

## 2023-07-19 MED ORDER — TRAZODONE HCL 100 MG PO TABS
100.0000 mg | ORAL_TABLET | Freq: Every day | ORAL | Status: AC
Start: 2023-07-19 — End: ?

## 2023-07-19 MED ORDER — FLUCONAZOLE 200 MG PO TABS: 200.0000 mg | ORAL_TABLET | Freq: Every day | ORAL | Status: AC

## 2023-07-19 MED ORDER — MELATONIN 3 MG PO TABS: 3.0000 mg | ORAL_TABLET | Freq: Every evening | ORAL | Status: DC | PRN

## 2023-07-19 MED ORDER — FERROUS SULFATE 325 (65 FE) MG PO TABS: 325.0000 mg | ORAL_TABLET | Freq: Two times a day (BID) | ORAL | Status: DC

## 2023-07-19 MED ORDER — GERHARDT'S BUTT CREAM: 1.0000 | TOPICAL_CREAM | Freq: Three times a day (TID) | CUTANEOUS | Status: DC

## 2023-07-19 MED ORDER — VANCOMYCIN HCL 125 MG PO CAPS: 125.0000 mg | ORAL_CAPSULE | Freq: Four times a day (QID) | ORAL | Status: AC

## 2023-07-19 MED ORDER — CYANOCOBALAMIN 500 MCG PO TABS: 500.0000 ug | ORAL_TABLET | Freq: Every day | ORAL | Status: DC

## 2023-07-19 MED ORDER — GABAPENTIN 100 MG PO CAPS: 100.0000 mg | ORAL_CAPSULE | Freq: Three times a day (TID) | ORAL | Status: DC

## 2023-07-19 MED ORDER — FLUCONAZOLE 100 MG PO TABS
200.0000 mg | ORAL_TABLET | Freq: Every day | ORAL | Status: DC
  Administered 2023-07-19: 200 mg via ORAL
  Filled 2023-07-19: qty 2

## 2023-07-19 NOTE — TOC Progression Note (Addendum)
Transition of Care [facility]) - Progression Note    Patient Details  Name: Calvin Escobar MRN: [id number] Date of Birth: [birth year]  Transition of Care [facility]) CM/SW Contact  Erin Sons, [place] Phone Number: 07/19/2023, 1:06 PM  Clinical Narrative:     [facility] approved 07/19/23- 07/23/23. Auth# [id number]  [facility] can admit today.   CSW has left a message with pt's spouse. Called spouse multiple times though it goes straight to voicemail. CSW called pt's home phone# and pt's son answer. CSW asks for spouse and informed that she does not get "out" until 4pm. He states he will call her and notify her to contact CSW.  1320: Received call from spouse. Updated her of DC. Provided her with SNF address.   RN, patient, patient's family, and facility notified of DC. Discharge Summary and FL2 sent to facility. RN to call report prior to discharge [phone] ). DC packet on chart. Ambulance transport requested for patient.   CSW will sign off for now as social work intervention is no longer needed. Please consult [place] again if new needs arise.   Expected Discharge Plan: Skilled Nursing Facility Barriers to Discharge: Continued Medical Work up, English as a second language teacher  Expected Discharge Plan and Services       Living arrangements for the past 2 months: Single Family Home Expected Discharge Date: 07/19/23                                     Social Determinants of Health (SDOH) Interventions SDOH Screenings   Food Insecurity: No Food Insecurity (07/13/2023)  Housing: Low Risk  (07/13/2023)  Transportation Needs: No Transportation Needs (07/13/2023)  Utilities: Not At Risk (07/13/2023)  Depression (PHQ2-9): Low Risk  (03/07/2023)  Recent Concern: Depression (PHQ2-9) - High Risk (01/15/2023)  Financial Resource Strain: Low Risk  (10/03/2022)  Physical Activity: Inactive (05/04/2022)  Social Connections: Moderately Isolated (07/13/2023)  Stress: Stress Concern Present (11/30/2022)  Tobacco  Use: Low Risk  (07/13/2023)    Readmission Risk Interventions    07/04/2023    1:27 PM 07/03/2023    2:48 PM 06/05/2023   10:12 AM  Readmission Risk Prevention Plan  Transportation Screening Complete Complete Complete  PCP or Specialist Appt within 5-7 Days Complete Complete   PCP or Specialist Appt within 3-5 Days   Complete  Home Care Screening Complete Complete   Medication Review (RN CM) Complete Complete   HRI or Home Care Consult   Complete  Social Work Consult for Recovery Care Planning/Counseling   Complete  Palliative Care Screening   Complete  Medication Review Oceanographer)   Complete

## 2023-07-19 NOTE — Progress Notes (Signed)
PHARMACIST - PHYSICIAN COMMUNICATION DR:   Isidoro Donning CONCERNING: Antibiotic IV to Oral Route Change Policy  RECOMMENDATION: This patient is receiving fluconazole by the intravenous route.  Based on criteria approved by the Pharmacy and Therapeutics Committee, the antibiotic(s) is/are being converted to the equivalent oral dose form(s).   DESCRIPTION: These criteria include: Patient being treated for a respiratory tract infection, urinary tract infection, cellulitis or clostridium difficile associated diarrhea if on metronidazole The patient is not neutropenic and does not exhibit a GI malabsorption state The patient is eating (either orally or via tube) and/or has been taking other orally administered medications for a least 24 hours The patient is improving clinically and has a Tmax < 100.5  If you have questions about this conversion, please contact the Pharmacy Department  []   [phone] )  [facility] [x]   [phone] )  [facility]  []   [phone] )  [facility] []   [phone] )  [facility]   Rexford Maus, PharmD, BCPS 07/19/2023 8:19 AM

## 2023-07-19 NOTE — Progress Notes (Signed)
Mobility Specialist Progress Note:    07/19/23 1100  Mobility  Activity Ambulated with assistance in hallway  Level of Assistance Contact guard assist, steadying assist  Assistive Device Front wheel walker  Distance Ambulated (ft) 100 ft  Activity Response Tolerated well  Mobility Referral Yes  Mobility visit 1 Mobility  Mobility Specialist Start Time (ACUTE ONLY) [id number]  Mobility Specialist Stop Time (ACUTE ONLY) 0851  Mobility Specialist Time Calculation (min) (ACUTE ONLY) 18 min   Pt received in bed, eager for mobility. Found to have had BM in bed. MS assisted w peri care. No complaints throughout. Pt left in bed with call bell and all needs met. Bed alarm on.  Calvin Escobar Mobility Specialist Please contact via Special educational needs teacher or Rehab office at [phone]

## 2023-07-19 NOTE — Discharge Summary (Signed)
 Physician Discharge Summary   Patient: Calvin Escobar MRN: [id number] DOB: [birth year]  Admit date:     07/12/2023  Discharge date: 07/19/23  Discharge Physician: Thad Ranger, MD    PCP: [name], MD   Recommendations at discharge:   Continue oral vancomycin 125 mg p.o. 4 times daily for 5 more days Continue Diflucan 200 mg daily for 7 more days  Discharge Diagnoses:    Acute metabolic encephalopathy   Acute cystitis   Acute hyponatremia   BPH (benign prostatic hyperplasia)   HLD (hyperlipidemia)   Anemia of chronic disease    sepsis, POA (HCC)   High anion gap metabolic acidosis   GERD (gastroesophageal reflux disease)    Hospital Course:  Calvin Escobar is a 73 y.o. male with medical history significant for dementia, hyperlipidemia, depression, GERD, BPH, anemia of chronic disease associated baseline hemoglobin 7, who is admitted to [facility] on 07/12/2023 with suspected acute metabolic encephalopathy after presenting from home to [facility] ED for evaluation of altered mental status.     Assessment and Plan:  Acute metabolic encephalopathy from from AKI, and dehydration.  Creatinine improved from 1.4 to 1.1 at discharge -Alert and oriented, appears at his baseline. -CT head showed no acute intracranial abnormality   Sepsis from UTI: POA -Met sepsis criteria on admission with fevers 101.6 F, tachycardia, tachypnea, leukocytosis.  UA suggestive of UTI.   -Blood cultures are negative so far.  - Urine cultures grew > 100,000 colonies yeast and 20,000 colonies of enterococcus.  - Patient received 3 days of IV cefepime as he had a h/o pseudomonas UTI, recently treated with Levaquin.  -Continue Diflucan 200 mg daily for 7 more days to complete course.   Acute metabolic encephalopathy, superimposed on dementia  -Alert and oriented today.   -Now appears close to his baseline.   C. difficile diarrhea/enteritis -C. difficile positive on 2/8 -Continue oral  vancomycin 125 mg 4 times daily for 5 more days to complete full course for 10 days -Diarrhea improved   GERD Stable.      Anemia of chronic disease/ iron deficiency anemia Hemoglobin at baseline around 8.  Iron supplementation on discharge.  -Hb stable 9.4 at discharge     Hyponatremia:  Na 131 on admission - Resolved, 135 at discharge        BPH Continue with flomax.     Hyperlipidemia Continue with pravastatin.    AKI Resolved.  Creatinine 1.1 at discharge.    Hypokalemia:  -Resolved, likely due to diarrhea. -Potassium 4.1 at discharge.   GERD Stable.       Generalized deconditioning/debility -PT evaluation recommended SNF   Estimated body mass index is 29.64 kg/m as calculated from the following:   Height as of this encounter: 6\' 1"  (1.854 m).   Weight as of this encounter: 101.9 kg.       Pain control - [employer] Controlled Substance Reporting System database was reviewed. and patient was instructed, not to drive, operate heavy machinery, perform activities at heights, swimming or participation in water activities or provide baby-sitting services while on Pain, Sleep and Anxiety Medications; until their outpatient Physician has advised to do so again. Also recommended to not to take more than prescribed Pain, Sleep and Anxiety Medications.  Consultants: none Procedures performed:   Disposition: Skilled nursing facility Diet recommendation:  Discharge Diet Orders (From admission, onward)     Start     Ordered   07/19/23 0000  Diet general  07/19/23 1008            DISCHARGE MEDICATION: Allergies as of 07/19/2023       Reactions   Albuterol Palpitations   Supraventricular Tachycardia         Medication List     STOP taking these medications    levofloxacin 500 MG tablet Commonly known as: LEVAQUIN       TAKE these medications    ARIPiprazole 2 MG tablet Commonly known as: ABILIFY Take 2 mg by mouth in the morning.    busPIRone 10 MG tablet Commonly known as: BUSPAR Take 10 mg by mouth 3 (three) times daily.   calcium-vitamin D 500-5 MG-MCG tablet Commonly known as: OSCAL WITH D Take 1 tablet by mouth daily with breakfast.   divalproex 250 MG 24 hr tablet Commonly known as: DEPAKOTE ER Take 500 mg by mouth at bedtime.   ferrous sulfate 325 (65 FE) MG tablet Take 1 tablet (325 mg total) by mouth 2 (two) times daily with a meal.   fluconazole 200 MG tablet Commonly known as: DIFLUCAN Take 1 tablet (200 mg total) by mouth daily for 7 days. Start taking on: July 20, 2023   folic acid 1 MG tablet Commonly known as: FOLVITE Take 1 tablet (1 mg total) by mouth daily. What changed: when to take this   gabapentin 100 MG capsule Commonly known as: NEURONTIN Take 1 capsule (100 mg total) by mouth 3 (three) times daily. What changed:  medication strength how much to take when to take this   Gerhardt's butt cream Crea Apply 1 Application topically 3 (three) times daily.   melatonin 3 MG Tabs tablet Take 1 tablet (3 mg total) by mouth at bedtime as needed (insomnia).   midodrine 5 MG tablet Commonly known as: PROAMATINE Take 1 tablet (5 mg total) by mouth 3 (three) times daily with meals.   mirtazapine 30 MG tablet Commonly known as: REMERON Take 30 mg by mouth at bedtime.   multivitamin tablet Take 1 tablet by mouth daily.   pantoprazole 40 MG tablet Commonly known as: PROTONIX Take 1 tablet (40 mg total) by mouth at bedtime.   pravastatin 20 MG tablet Commonly known as: PRAVACHOL Take 1 tablet (20 mg total) by mouth at bedtime.   primidone 50 MG tablet Commonly known as: MYSOLINE Take 1 tablet (50 mg total) by mouth 2 (two) times daily.   tamsulosin 0.4 MG Caps capsule Commonly known as: FLOMAX TAKE 1 CAPSULE BY MOUTH EVERY DAY   traZODone 100 MG tablet Commonly known as: DESYREL Take 1 tablet (100 mg total) by mouth at bedtime. What changed: how much to take    vancomycin 125 MG capsule Commonly known as: VANCOCIN Take 1 capsule (125 mg total) by mouth 4 (four) times daily for 5 days.   venlafaxine XR 150 MG 24 hr capsule Commonly known as: EFFEXOR-XR Take 300 mg by mouth daily with breakfast.        Contact information for follow-up providers     [name], MD. Schedule an appointment as soon as possible for a visit in 2 week(s).   Specialty: Internal Medicine Contact information: [address] [place] [address] [phone]              Contact information for after-discharge care     Destination     [facility] Preferred SNF .   Service: Skilled Paramedic information: [address] [place] [address] [phone]  Discharge Exam: Filed Weights   07/13/23 0139 07/16/23 0500 07/17/23 0500  Weight: 101.9 kg 101.9 kg 101.9 kg   S: doing well. No acute issues, alert and oriented, eating breakfast, appears at baseline    BP 120/72 (BP Location: Left Arm)   Pulse 90   Temp 98.7 F (37.1 C) (Oral)   Resp 12   Ht 6\' 1"  (1.854 m)   Wt 101.9 kg   SpO2 99%   BMI 29.64 kg/m    Physical Exam General: Alert and oriented x 3, NAD Cardiovascular: S1 S2 clear, RRR.  Respiratory: CTAB, no wheezing Gastrointestinal: Soft, nontender, nondistended, NBS Ext: no pedal edema bilaterally Neuro: no new deficits Psych: Normal affect     Condition at discharge: fair  The results of significant diagnostics from this hospitalization (including imaging, microbiology, ancillary and laboratory) are listed below for reference.   Imaging Studies: CT HEAD WO CONTRAST ( ) Result Date: 07/17/2023 CLINICAL DATA:  Mental status change, unknown cause EXAM: CT HEAD WITHOUT CONTRAST TECHNIQUE: Contiguous axial images were obtained from the base of the skull through the vertex without intravenous contrast. RADIATION DOSE REDUCTION: This  exam was performed according to the departmental dose-optimization program which includes automated exposure control, adjustment of the mA and/or kV according to patient size and/or use of iterative reconstruction technique. COMPARISON:  CT head 07/03/2023. FINDINGS: Brain: No evidence of acute infarction, hemorrhage, hydrocephalus, extra-axial collection or mass lesion/mass effect. Vascular: No hyperdense vessel. Skull: No acute fracture. Sinuses/Orbits: Clear sinuses.  No acute orbital findings. Other: No mastoid effusions. IMPRESSION: No evidence of acute intracranial abnormality. Electronically Signed   By: Feliberto Harts M.D.   On: 07/17/2023 19:07   DG Chest 1 View Result Date: 07/12/2023 CLINICAL DATA:  Altered mental status.  Penis pain. EXAM: CHEST  1 VIEW COMPARISON:  07/01/2023. FINDINGS: The heart size and mediastinal contours are within normal limits. No consolidation, effusion, or pneumothorax is seen. There is a stable calcified granuloma in the mid right lung. No acute osseous abnormality. IMPRESSION: No active disease. Electronically Signed   By: Thornell Sartorius M.D.   On: 07/12/2023 20:52   CT Head Wo Contrast Result Date: 07/03/2023 CLINICAL DATA:  Head trauma, minor (Age >= 65y) EXAM: CT HEAD WITHOUT CONTRAST TECHNIQUE: Contiguous axial images were obtained from the base of the skull through the vertex without intravenous contrast. RADIATION DOSE REDUCTION: This exam was performed according to the departmental dose-optimization program which includes automated exposure control, adjustment of the mA and/or kV according to patient size and/or use of iterative reconstruction technique. COMPARISON:  CT head 06/02/2023. FINDINGS: Brain: No evidence of acute infarction, hemorrhage, hydrocephalus, extra-axial collection or mass lesion/mass effect. Similar chronic microvascular ischemic change and cerebral atrophy. Vascular: No hyperdense vessel.  Calcific atherosclerosis. Skull: No acute fracture.  Sinuses/Orbits: Mostly clear sinuses.  No acute orbital findings. Other: No mastoid effusions. IMPRESSION: Stable head CT.  No evidence of acute intracranial abnormality. Electronically Signed   By: Feliberto Harts M.D.   On: 07/03/2023 00:53   DG Chest Port 1 View Result Date: 07/01/2023 CLINICAL DATA:  Possible sepsis EXAM: PORTABLE CHEST 1 VIEW COMPARISON:  06/07/2023 FINDINGS: Cardiac shadow is stable. Poor inspiratory effort is noted. Calcified granuloma is again noted in the right mid lung. No focal infiltrate or effusion is seen. No bony abnormality is noted. IMPRESSION: Poor inspiratory effort.  No acute abnormality noted. Electronically Signed   By: Alcide Clever M.D.   On: 07/01/2023 20:14    Microbiology:  Results for orders placed or performed during the hospital encounter of 07/12/23  Resp panel by RT-PCR (RSV, Flu A&B, Covid) Urine, Catheterized     Status: None   Collection Time: 07/12/23  8:27 PM   Specimen: Urine, Catheterized; Nasal Swab  Result Value Ref Range Status   SARS Coronavirus 2 by RT PCR NEGATIVE NEGATIVE Final   Influenza A by PCR NEGATIVE NEGATIVE Final   Influenza B by PCR NEGATIVE NEGATIVE Final    Comment: (NOTE) The Xpert Xpress SARS-CoV-2/FLU/RSV plus assay is intended as an aid in the diagnosis of influenza from Nasopharyngeal swab specimens and should not be used as a sole basis for treatment. Nasal washings and aspirates are unacceptable for Xpert Xpress SARS-CoV-2/FLU/RSV testing.  Fact Sheet for Patients: BloggerCourse.com  Fact Sheet for Healthcare Providers: SeriousBroker.it  This test is not yet approved or cleared by the [place] FDA and has been authorized for detection and/or diagnosis of SARS-CoV-2 by FDA under an Emergency Use Authorization (EUA). This EUA will remain in effect (meaning this test can be used) for the duration of the COVID-19 declaration under Section 564(b)(1) of the  Act, 21 U.S.C. section 360bbb-3(b)(1), unless the authorization is terminated or revoked.     Resp Syncytial Virus by PCR NEGATIVE NEGATIVE Final    Comment: (NOTE) Fact Sheet for Patients: BloggerCourse.com  Fact Sheet for Healthcare Providers: SeriousBroker.it  This test is not yet approved or cleared by the [place] FDA and has been authorized for detection and/or diagnosis of SARS-CoV-2 by FDA under an Emergency Use Authorization (EUA). This EUA will remain in effect (meaning this test can be used) for the duration of the COVID-19 declaration under Section 564(b)(1) of the Act, 21 U.S.C. section 360bbb-3(b)(1), unless the authorization is terminated or revoked.  Performed at [facility], [address], [place], [place] [address]   Urine Culture     Status: Abnormal   Collection Time: 07/12/23  8:34 PM   Specimen: Urine, Catheterized  Result Value Ref Range Status   Specimen Description URINE, CATHETERIZED  Final   Special Requests   Final    NONE Reflexed from [id number] Performed at [facility], [address] [place], [place] [address]    Culture (A)  Final    >=100,000 COLONIES/mL YEAST 20,000 COLONIES/mL ENTEROCOCCUS FAECIUM VANCOMYCIN RESISTANT ENTEROCOCCUS ISOLATED    Report Status 07/16/2023 FINAL  Final   Organism ID, Bacteria ENTEROCOCCUS FAECIUM (A)  Final      Susceptibility   Enterococcus faecium - MIC*    AMPICILLIN >=32 RESISTANT Resistant     NITROFURANTOIN 64 INTERMEDIATE Intermediate     VANCOMYCIN >=32 RESISTANT Resistant     LINEZOLID 2 SENSITIVE Sensitive     * 20,000 COLONIES/mL ENTEROCOCCUS FAECIUM  Blood culture (routine x 2)     Status: None   Collection Time: 07/13/23 12:00 AM   Specimen: BLOOD RIGHT HAND  Result Value Ref Range Status   Specimen Description BLOOD RIGHT HAND  Final   Special Requests   Final    BOTTLES DRAWN AEROBIC AND ANAEROBIC Blood Culture adequate  volume   Culture   Final    NO GROWTH 5 DAYS Performed at [facility], [address] [place], [place] [address]    Report Status 07/18/2023 FINAL  Final  Blood culture (routine x 2)     Status: None   Collection Time: 07/13/23 12:15 AM   Specimen: BLOOD LEFT HAND  Result Value Ref Range  Status   Specimen Description BLOOD LEFT HAND  Final   Special Requests   Final    BOTTLES DRAWN AEROBIC AND ANAEROBIC Blood Culture adequate volume   Culture   Final    NO GROWTH 5 DAYS Performed at [facility], [address], [place], [place] [address]    Report Status 07/18/2023 FINAL  Final  C Difficile Quick Screen w PCR reflex     Status: Abnormal   Collection Time: 07/13/23  9:37 AM   Specimen: STOOL  Result Value Ref Range Status   C Diff antigen POSITIVE (A) NEGATIVE Final   C Diff toxin POSITIVE (A) NEGATIVE Final   C Diff interpretation Toxin producing C. difficile detected.  Final    Comment: Performed at [facility], [address], [place], [place] [address]    Labs: CBC: Recent Labs  Lab 07/12/23 2040 07/13/23 0739 07/14/23 0217 07/16/23 0410 07/18/23 0757 07/19/23 0729  WBC 18.5* 13.9* 12.5* 8.7 7.4 7.8  NEUTROABS 14.7* 11.1* 9.6* 5.5  --   --   HGB 9.4* 8.8* 8.2* 8.6* 9.4* 9.4*  HCT 30.1* 27.6* 25.8* 27.8* 30.6* 31.2*  MCV 86.7 85.2 84.6 86.1 87.9 87.4  PLT 284 228 207 268 324 301   Basic Metabolic Panel: Recent Labs  Lab 07/12/23 2040 07/13/23 0739 07/14/23 0217 07/15/23 1430 07/16/23 0410 07/18/23 0757 07/19/23 0729  NA 131* 131* 132* 132* 137 137 135  K 3.8 3.8 3.4* 3.9 3.5 4.1 4.1  CL 95* 99 98 97* 101 103 101  CO2 20* 22 23 19* 23 24 24   GLUCOSE 107* 99 99 98 89 126* 96  BUN 16 14 11 8  7* 8 7*  CREATININE 1.44* 1.29* 1.02 0.96 1.09 1.23 1.17  CALCIUM 8.7* 8.4* 8.1* 8.7* 8.6* 8.6* 8.3*  MG 1.8 1.7  --   --   --   --   --    Liver Function Tests: Recent Labs  Lab 07/13/23 0739 07/15/23 1430 07/18/23 0757  AST 24 24 25    ALT 13 12 12   ALKPHOS 47 52 50  BILITOT 0.9 0.3 0.3  PROT 6.6 6.6 6.8  ALBUMIN 2.4* 2.4* 2.3*   CBG: Recent Labs  Lab 07/15/23 2043 07/17/23 1128  GLUCAP 97 85    Discharge time spent: greater than 30 minutes.  Signed: Thad Ranger, MD [facility] 07/19/2023

## 2023-07-22 DIAGNOSIS — E785 Hyperlipidemia, unspecified: Secondary | ICD-10-CM | POA: Diagnosis not present

## 2023-07-22 DIAGNOSIS — A419 Sepsis, unspecified organism: Secondary | ICD-10-CM | POA: Diagnosis not present

## 2023-07-22 DIAGNOSIS — E8729 Other acidosis: Secondary | ICD-10-CM | POA: Diagnosis not present

## 2023-07-22 DIAGNOSIS — N390 Urinary tract infection, site not specified: Secondary | ICD-10-CM | POA: Diagnosis not present

## 2023-07-22 DIAGNOSIS — R8290 Unspecified abnormal findings in urine: Secondary | ICD-10-CM | POA: Diagnosis not present

## 2023-07-22 DIAGNOSIS — K219 Gastro-esophageal reflux disease without esophagitis: Secondary | ICD-10-CM | POA: Diagnosis not present

## 2023-07-22 DIAGNOSIS — T839XXD Unspecified complication of genitourinary prosthetic device, implant and graft, subsequent encounter: Secondary | ICD-10-CM | POA: Diagnosis not present

## 2023-07-22 DIAGNOSIS — R6520 Severe sepsis without septic shock: Secondary | ICD-10-CM | POA: Diagnosis not present

## 2023-07-22 DIAGNOSIS — G9341 Metabolic encephalopathy: Secondary | ICD-10-CM | POA: Diagnosis not present

## 2023-07-23 ENCOUNTER — Other Ambulatory Visit: Payer: Self-pay | Admitting: Internal Medicine

## 2023-07-23 ENCOUNTER — Telehealth: Payer: Self-pay | Admitting: Internal Medicine

## 2023-07-23 DIAGNOSIS — N3943 Post-void dribbling: Secondary | ICD-10-CM

## 2023-07-23 DIAGNOSIS — N390 Urinary tract infection, site not specified: Secondary | ICD-10-CM | POA: Diagnosis not present

## 2023-07-23 DIAGNOSIS — G9341 Metabolic encephalopathy: Secondary | ICD-10-CM | POA: Diagnosis not present

## 2023-07-23 DIAGNOSIS — R6520 Severe sepsis without septic shock: Secondary | ICD-10-CM | POA: Diagnosis not present

## 2023-07-23 DIAGNOSIS — R8290 Unspecified abnormal findings in urine: Secondary | ICD-10-CM | POA: Diagnosis not present

## 2023-07-23 DIAGNOSIS — E8729 Other acidosis: Secondary | ICD-10-CM | POA: Diagnosis not present

## 2023-07-23 DIAGNOSIS — A419 Sepsis, unspecified organism: Secondary | ICD-10-CM | POA: Diagnosis not present

## 2023-07-23 DIAGNOSIS — D638 Anemia in other chronic diseases classified elsewhere: Secondary | ICD-10-CM | POA: Diagnosis not present

## 2023-07-23 DIAGNOSIS — N138 Other obstructive and reflux uropathy: Secondary | ICD-10-CM

## 2023-07-23 DIAGNOSIS — K219 Gastro-esophageal reflux disease without esophagitis: Secondary | ICD-10-CM | POA: Diagnosis not present

## 2023-07-23 DIAGNOSIS — T839XXD Unspecified complication of genitourinary prosthetic device, implant and graft, subsequent encounter: Secondary | ICD-10-CM | POA: Diagnosis not present

## 2023-07-23 DIAGNOSIS — E785 Hyperlipidemia, unspecified: Secondary | ICD-10-CM | POA: Diagnosis not present

## 2023-07-23 DIAGNOSIS — E871 Hypo-osmolality and hyponatremia: Secondary | ICD-10-CM | POA: Diagnosis not present

## 2023-07-23 NOTE — Telephone Encounter (Unsigned)
 Copied from CRM [id number]. Topic: General - Other >> Jul 23, 2023  4:49 PM Eunice Blase wrote: Reason for CRM: Received call from [facility] per [facility] ph: [phone] on 06/18/2023 need add on discipline order.Please call Cathy.

## 2023-07-24 DIAGNOSIS — A419 Sepsis, unspecified organism: Secondary | ICD-10-CM | POA: Diagnosis not present

## 2023-07-24 DIAGNOSIS — E785 Hyperlipidemia, unspecified: Secondary | ICD-10-CM | POA: Diagnosis not present

## 2023-07-24 DIAGNOSIS — T839XXD Unspecified complication of genitourinary prosthetic device, implant and graft, subsequent encounter: Secondary | ICD-10-CM | POA: Diagnosis not present

## 2023-07-24 DIAGNOSIS — R8290 Unspecified abnormal findings in urine: Secondary | ICD-10-CM | POA: Diagnosis not present

## 2023-07-24 DIAGNOSIS — R6520 Severe sepsis without septic shock: Secondary | ICD-10-CM | POA: Diagnosis not present

## 2023-07-24 DIAGNOSIS — K219 Gastro-esophageal reflux disease without esophagitis: Secondary | ICD-10-CM | POA: Diagnosis not present

## 2023-07-24 DIAGNOSIS — E8729 Other acidosis: Secondary | ICD-10-CM | POA: Diagnosis not present

## 2023-07-24 DIAGNOSIS — G9341 Metabolic encephalopathy: Secondary | ICD-10-CM | POA: Diagnosis not present

## 2023-07-24 DIAGNOSIS — N390 Urinary tract infection, site not specified: Secondary | ICD-10-CM | POA: Diagnosis not present

## 2023-07-24 NOTE — Telephone Encounter (Signed)
 Spoke with Cahthy from Home Health ph: [phone]  Stated form was refaxed yesterday  Please  sign and fax form order

## 2023-07-26 DIAGNOSIS — T839XXD Unspecified complication of genitourinary prosthetic device, implant and graft, subsequent encounter: Secondary | ICD-10-CM | POA: Diagnosis not present

## 2023-07-26 DIAGNOSIS — K219 Gastro-esophageal reflux disease without esophagitis: Secondary | ICD-10-CM | POA: Diagnosis not present

## 2023-07-26 DIAGNOSIS — E8729 Other acidosis: Secondary | ICD-10-CM | POA: Diagnosis not present

## 2023-07-26 DIAGNOSIS — A419 Sepsis, unspecified organism: Secondary | ICD-10-CM | POA: Diagnosis not present

## 2023-07-26 DIAGNOSIS — R6520 Severe sepsis without septic shock: Secondary | ICD-10-CM | POA: Diagnosis not present

## 2023-07-26 DIAGNOSIS — N390 Urinary tract infection, site not specified: Secondary | ICD-10-CM | POA: Diagnosis not present

## 2023-07-26 DIAGNOSIS — R8290 Unspecified abnormal findings in urine: Secondary | ICD-10-CM | POA: Diagnosis not present

## 2023-07-26 DIAGNOSIS — G9341 Metabolic encephalopathy: Secondary | ICD-10-CM | POA: Diagnosis not present

## 2023-07-26 DIAGNOSIS — E785 Hyperlipidemia, unspecified: Secondary | ICD-10-CM | POA: Diagnosis not present

## 2023-07-29 DIAGNOSIS — E8729 Other acidosis: Secondary | ICD-10-CM | POA: Diagnosis not present

## 2023-07-29 DIAGNOSIS — R8290 Unspecified abnormal findings in urine: Secondary | ICD-10-CM | POA: Diagnosis not present

## 2023-07-29 DIAGNOSIS — K219 Gastro-esophageal reflux disease without esophagitis: Secondary | ICD-10-CM | POA: Diagnosis not present

## 2023-07-29 DIAGNOSIS — A419 Sepsis, unspecified organism: Secondary | ICD-10-CM | POA: Diagnosis not present

## 2023-07-29 DIAGNOSIS — N390 Urinary tract infection, site not specified: Secondary | ICD-10-CM | POA: Diagnosis not present

## 2023-07-29 DIAGNOSIS — E785 Hyperlipidemia, unspecified: Secondary | ICD-10-CM | POA: Diagnosis not present

## 2023-07-29 DIAGNOSIS — R6520 Severe sepsis without septic shock: Secondary | ICD-10-CM | POA: Diagnosis not present

## 2023-07-29 DIAGNOSIS — G9341 Metabolic encephalopathy: Secondary | ICD-10-CM | POA: Diagnosis not present

## 2023-07-29 DIAGNOSIS — T839XXD Unspecified complication of genitourinary prosthetic device, implant and graft, subsequent encounter: Secondary | ICD-10-CM | POA: Diagnosis not present

## 2023-07-30 NOTE — Telephone Encounter (Signed)
 Forms were signed and faxed back

## 2023-07-31 DIAGNOSIS — G9341 Metabolic encephalopathy: Secondary | ICD-10-CM | POA: Diagnosis not present

## 2023-07-31 DIAGNOSIS — N390 Urinary tract infection, site not specified: Secondary | ICD-10-CM | POA: Diagnosis not present

## 2023-07-31 DIAGNOSIS — K219 Gastro-esophageal reflux disease without esophagitis: Secondary | ICD-10-CM | POA: Diagnosis not present

## 2023-07-31 DIAGNOSIS — E8729 Other acidosis: Secondary | ICD-10-CM | POA: Diagnosis not present

## 2023-07-31 DIAGNOSIS — R8290 Unspecified abnormal findings in urine: Secondary | ICD-10-CM | POA: Diagnosis not present

## 2023-07-31 DIAGNOSIS — A419 Sepsis, unspecified organism: Secondary | ICD-10-CM | POA: Diagnosis not present

## 2023-07-31 DIAGNOSIS — T839XXD Unspecified complication of genitourinary prosthetic device, implant and graft, subsequent encounter: Secondary | ICD-10-CM | POA: Diagnosis not present

## 2023-07-31 DIAGNOSIS — E785 Hyperlipidemia, unspecified: Secondary | ICD-10-CM | POA: Diagnosis not present

## 2023-07-31 DIAGNOSIS — R6520 Severe sepsis without septic shock: Secondary | ICD-10-CM | POA: Diagnosis not present

## 2023-08-01 ENCOUNTER — Telehealth: Payer: Self-pay | Admitting: Internal Medicine

## 2023-08-01 DIAGNOSIS — E8729 Other acidosis: Secondary | ICD-10-CM | POA: Diagnosis not present

## 2023-08-01 DIAGNOSIS — R6520 Severe sepsis without septic shock: Secondary | ICD-10-CM | POA: Diagnosis not present

## 2023-08-01 DIAGNOSIS — G9341 Metabolic encephalopathy: Secondary | ICD-10-CM | POA: Diagnosis not present

## 2023-08-01 DIAGNOSIS — K219 Gastro-esophageal reflux disease without esophagitis: Secondary | ICD-10-CM | POA: Diagnosis not present

## 2023-08-01 DIAGNOSIS — N390 Urinary tract infection, site not specified: Secondary | ICD-10-CM | POA: Diagnosis not present

## 2023-08-01 DIAGNOSIS — R8290 Unspecified abnormal findings in urine: Secondary | ICD-10-CM | POA: Diagnosis not present

## 2023-08-01 DIAGNOSIS — T839XXD Unspecified complication of genitourinary prosthetic device, implant and graft, subsequent encounter: Secondary | ICD-10-CM | POA: Diagnosis not present

## 2023-08-01 DIAGNOSIS — E785 Hyperlipidemia, unspecified: Secondary | ICD-10-CM | POA: Diagnosis not present

## 2023-08-01 DIAGNOSIS — A419 Sepsis, unspecified organism: Secondary | ICD-10-CM | POA: Diagnosis not present

## 2023-08-01 NOTE — Telephone Encounter (Signed)
 Received faxed  document Home Health Certificate (Order ID ([id number]), to be filled out by provider. Patient requested to send it back via Fax within 5-days. Document is located in providers tray at front office.Please advise

## 2023-08-06 DIAGNOSIS — G9341 Metabolic encephalopathy: Secondary | ICD-10-CM | POA: Diagnosis not present

## 2023-08-06 DIAGNOSIS — R6520 Severe sepsis without septic shock: Secondary | ICD-10-CM | POA: Diagnosis not present

## 2023-08-06 DIAGNOSIS — G4733 Obstructive sleep apnea (adult) (pediatric): Secondary | ICD-10-CM | POA: Diagnosis not present

## 2023-08-06 DIAGNOSIS — F3130 Bipolar disorder, current episode depressed, mild or moderate severity, unspecified: Secondary | ICD-10-CM | POA: Diagnosis not present

## 2023-08-06 DIAGNOSIS — N179 Acute kidney failure, unspecified: Secondary | ICD-10-CM | POA: Diagnosis not present

## 2023-08-06 DIAGNOSIS — I420 Dilated cardiomyopathy: Secondary | ICD-10-CM | POA: Diagnosis not present

## 2023-08-06 DIAGNOSIS — N3000 Acute cystitis without hematuria: Secondary | ICD-10-CM | POA: Diagnosis not present

## 2023-08-06 DIAGNOSIS — A419 Sepsis, unspecified organism: Secondary | ICD-10-CM | POA: Diagnosis not present

## 2023-08-06 DIAGNOSIS — F0393 Unspecified dementia, unspecified severity, with mood disturbance: Secondary | ICD-10-CM | POA: Diagnosis not present

## 2023-08-06 NOTE — Telephone Encounter (Signed)
Faxed signed forms back

## 2023-08-08 ENCOUNTER — Telehealth: Payer: Self-pay

## 2023-08-08 NOTE — Telephone Encounter (Signed)
 Copied from CRM [id number]. Topic: Clinical - Home Health Verbal Orders >> Aug 08, 2023 12:04 PM Deaijah H wrote: [facility]  Callback Number: [phone] Service Requested: Physical Therapy Frequency: 1W4, 2W4, 1W9 Any new concerns about the patient? No     Called back and let vm for Gracee with verbal orders for PT for patient.  Donzetta Starch, CMA

## 2023-08-09 DIAGNOSIS — F332 Major depressive disorder, recurrent severe without psychotic features: Secondary | ICD-10-CM | POA: Diagnosis not present

## 2023-08-09 DIAGNOSIS — F1011 Alcohol abuse, in remission: Secondary | ICD-10-CM | POA: Diagnosis not present

## 2023-08-09 DIAGNOSIS — F413 Other mixed anxiety disorders: Secondary | ICD-10-CM | POA: Diagnosis not present

## 2023-08-12 ENCOUNTER — Inpatient Hospital Stay: Payer: Medicare HMO | Admitting: Internal Medicine

## 2023-08-13 ENCOUNTER — Telehealth: Payer: Self-pay | Admitting: Internal Medicine

## 2023-08-13 DIAGNOSIS — L570 Actinic keratosis: Secondary | ICD-10-CM | POA: Diagnosis not present

## 2023-08-13 DIAGNOSIS — Z08 Encounter for follow-up examination after completed treatment for malignant neoplasm: Secondary | ICD-10-CM | POA: Diagnosis not present

## 2023-08-13 DIAGNOSIS — X32XXXD Exposure to sunlight, subsequent encounter: Secondary | ICD-10-CM | POA: Diagnosis not present

## 2023-08-13 DIAGNOSIS — Z85820 Personal history of malignant melanoma of skin: Secondary | ICD-10-CM | POA: Diagnosis not present

## 2023-08-13 DIAGNOSIS — Z1283 Encounter for screening for malignant neoplasm of skin: Secondary | ICD-10-CM | POA: Diagnosis not present

## 2023-08-13 DIAGNOSIS — D225 Melanocytic nevi of trunk: Secondary | ICD-10-CM | POA: Diagnosis not present

## 2023-08-13 NOTE — Telephone Encounter (Signed)
 Received faxed  document Home Health Certificate (Order ID [id number]), to be filled out by provider. Patient requested to send it back via Fax .Document is located in providers tray at front office.Please advise.

## 2023-08-14 ENCOUNTER — Ambulatory Visit (INDEPENDENT_AMBULATORY_CARE_PROVIDER_SITE_OTHER): Admitting: Internal Medicine

## 2023-08-14 ENCOUNTER — Encounter: Payer: Self-pay | Admitting: Internal Medicine

## 2023-08-14 VITALS — BP 98/69 | HR 89 | Temp 98.4°F | Ht 73.0 in | Wt 224.0 lb

## 2023-08-14 DIAGNOSIS — E7800 Pure hypercholesterolemia, unspecified: Secondary | ICD-10-CM

## 2023-08-14 DIAGNOSIS — K219 Gastro-esophageal reflux disease without esophagitis: Secondary | ICD-10-CM | POA: Diagnosis not present

## 2023-08-14 DIAGNOSIS — N400 Enlarged prostate without lower urinary tract symptoms: Secondary | ICD-10-CM

## 2023-08-14 DIAGNOSIS — N3000 Acute cystitis without hematuria: Secondary | ICD-10-CM | POA: Diagnosis not present

## 2023-08-14 DIAGNOSIS — I5020 Unspecified systolic (congestive) heart failure: Secondary | ICD-10-CM | POA: Diagnosis not present

## 2023-08-14 DIAGNOSIS — I491 Atrial premature depolarization: Secondary | ICD-10-CM | POA: Diagnosis not present

## 2023-08-14 DIAGNOSIS — N1831 Chronic kidney disease, stage 3a: Secondary | ICD-10-CM | POA: Diagnosis not present

## 2023-08-14 DIAGNOSIS — R634 Abnormal weight loss: Secondary | ICD-10-CM

## 2023-08-14 DIAGNOSIS — F3131 Bipolar disorder, current episode depressed, mild: Secondary | ICD-10-CM

## 2023-08-14 DIAGNOSIS — R251 Tremor, unspecified: Secondary | ICD-10-CM

## 2023-08-14 DIAGNOSIS — F3130 Bipolar disorder, current episode depressed, mild or moderate severity, unspecified: Secondary | ICD-10-CM | POA: Diagnosis not present

## 2023-08-14 DIAGNOSIS — E785 Hyperlipidemia, unspecified: Secondary | ICD-10-CM | POA: Diagnosis not present

## 2023-08-14 DIAGNOSIS — E8809 Other disorders of plasma-protein metabolism, not elsewhere classified: Secondary | ICD-10-CM | POA: Insufficient documentation

## 2023-08-14 DIAGNOSIS — Z6829 Body mass index (BMI) 29.0-29.9, adult: Secondary | ICD-10-CM

## 2023-08-14 DIAGNOSIS — I77810 Thoracic aortic ectasia: Secondary | ICD-10-CM | POA: Diagnosis not present

## 2023-08-14 DIAGNOSIS — E66812 Obesity, class 2: Secondary | ICD-10-CM | POA: Diagnosis not present

## 2023-08-14 DIAGNOSIS — N321 Vesicointestinal fistula: Secondary | ICD-10-CM | POA: Diagnosis not present

## 2023-08-14 DIAGNOSIS — F0393 Unspecified dementia, unspecified severity, with mood disturbance: Secondary | ICD-10-CM | POA: Diagnosis not present

## 2023-08-14 DIAGNOSIS — G4700 Insomnia, unspecified: Secondary | ICD-10-CM | POA: Diagnosis not present

## 2023-08-14 DIAGNOSIS — D638 Anemia in other chronic diseases classified elsewhere: Secondary | ICD-10-CM

## 2023-08-14 DIAGNOSIS — T839XXD Unspecified complication of genitourinary prosthetic device, implant and graft, subsequent encounter: Secondary | ICD-10-CM

## 2023-08-14 DIAGNOSIS — J42 Unspecified chronic bronchitis: Secondary | ICD-10-CM | POA: Diagnosis not present

## 2023-08-14 DIAGNOSIS — Z8619 Personal history of other infectious and parasitic diseases: Secondary | ICD-10-CM

## 2023-08-14 DIAGNOSIS — G4733 Obstructive sleep apnea (adult) (pediatric): Secondary | ICD-10-CM | POA: Diagnosis not present

## 2023-08-14 DIAGNOSIS — I959 Hypotension, unspecified: Secondary | ICD-10-CM | POA: Diagnosis not present

## 2023-08-14 DIAGNOSIS — Z87440 Personal history of urinary (tract) infections: Secondary | ICD-10-CM

## 2023-08-14 DIAGNOSIS — F0390 Unspecified dementia without behavioral disturbance: Secondary | ICD-10-CM | POA: Diagnosis not present

## 2023-08-14 DIAGNOSIS — N179 Acute kidney failure, unspecified: Secondary | ICD-10-CM | POA: Diagnosis not present

## 2023-08-14 DIAGNOSIS — B3749 Other urogenital candidiasis: Secondary | ICD-10-CM | POA: Diagnosis not present

## 2023-08-14 DIAGNOSIS — I420 Dilated cardiomyopathy: Secondary | ICD-10-CM | POA: Diagnosis not present

## 2023-08-14 DIAGNOSIS — N390 Urinary tract infection, site not specified: Secondary | ICD-10-CM | POA: Diagnosis not present

## 2023-08-14 DIAGNOSIS — F5101 Primary insomnia: Secondary | ICD-10-CM

## 2023-08-14 DIAGNOSIS — I447 Left bundle-branch block, unspecified: Secondary | ICD-10-CM | POA: Diagnosis not present

## 2023-08-14 DIAGNOSIS — G9341 Metabolic encephalopathy: Secondary | ICD-10-CM | POA: Diagnosis not present

## 2023-08-14 DIAGNOSIS — N183 Chronic kidney disease, stage 3 unspecified: Secondary | ICD-10-CM | POA: Diagnosis not present

## 2023-08-14 DIAGNOSIS — M1990 Unspecified osteoarthritis, unspecified site: Secondary | ICD-10-CM | POA: Diagnosis not present

## 2023-08-14 DIAGNOSIS — D696 Thrombocytopenia, unspecified: Secondary | ICD-10-CM

## 2023-08-14 DIAGNOSIS — D649 Anemia, unspecified: Secondary | ICD-10-CM

## 2023-08-14 DIAGNOSIS — I10 Essential (primary) hypertension: Secondary | ICD-10-CM | POA: Diagnosis not present

## 2023-08-14 DIAGNOSIS — A419 Sepsis, unspecified organism: Secondary | ICD-10-CM | POA: Diagnosis not present

## 2023-08-14 DIAGNOSIS — R6520 Severe sepsis without septic shock: Secondary | ICD-10-CM | POA: Diagnosis not present

## 2023-08-14 HISTORY — DX: Hypocalcemia: E83.51

## 2023-08-14 LAB — VITAMIN D 25 HYDROXY (VIT D DEFICIENCY, FRACTURES): VITD: 36.65 ng/mL (ref 30.00–100.00)

## 2023-08-14 LAB — C-REACTIVE PROTEIN: CRP: <1 mg/dL (ref 0.5–20.0)

## 2023-08-14 LAB — COMPREHENSIVE METABOLIC PANEL
ALT: 9 U/L (ref 0–53)
AST: 15 U/L (ref 0–37)
Albumin: 3.8 g/dL (ref 3.5–5.2)
Alkaline Phosphatase: 86 U/L (ref 39–117)
BUN: 26 mg/dL — ABNORMAL HIGH (ref 6–23)
CO2: 29 meq/L (ref 19–32)
Calcium: 8.9 mg/dL (ref 8.4–10.5)
Chloride: 104 meq/L (ref 96–112)
Creatinine, Ser: 1.29 mg/dL (ref 0.40–1.50)
GFR: 54.54 mL/min — ABNORMAL LOW (ref >60.00)
Glucose, Bld: 84 mg/dL (ref 70–99)
Potassium: 4.8 meq/L (ref 3.5–5.1)
Sodium: 141 meq/L (ref 135–145)
Total Bilirubin: 0.2 mg/dL (ref 0.2–1.2)
Total Protein: 6.8 g/dL (ref 6.0–8.3)

## 2023-08-14 LAB — SEDIMENTATION RATE: Sed Rate: 50 mm/h — ABNORMAL HIGH (ref 0–20)

## 2023-08-14 LAB — HEMOGLOBIN A1C: Hgb A1c MFr Bld: 5.3 % (ref 4.6–6.5)

## 2023-08-14 LAB — MICROALBUMIN / CREATININE URINE RATIO
Creatinine,U: 51.7 mg/dL
Microalb Creat Ratio: 72.2 mg/g — ABNORMAL HIGH (ref 0.0–30.0)
Microalb, Ur: 3.7 mg/dL — ABNORMAL HIGH (ref 0.0–1.9)

## 2023-08-14 LAB — URIC ACID: Uric Acid, Serum: 6.5 mg/dL (ref 4.0–7.8)

## 2023-08-14 LAB — PSA: PSA: 0.61 ng/mL (ref 0.10–4.00)

## 2023-08-14 NOTE — Telephone Encounter (Signed)
Faxed signed forms back

## 2023-08-14 NOTE — Assessment & Plan Note (Signed)
 Nutritional markers: Prealbumin, transferrin, iron panel (especially given anemia). Inflammatory markers: CRP, ESR (if there is ongoing concern for chronic inflammation). Liver function: INR, direct bilirubin (though current AST/ALT are normal). Proteinuria: Urinalysis with protein/creatinine ratio (especially with CKD).

## 2023-08-14 NOTE — Progress Notes (Unsigned)
 ==============================  [facility]: [phone]   -- Medical Office Visit --  Patient: Calvin Escobar      Age: 73 y.o.       Sex:  male  Date:   08/14/2023 Today's Healthcare Provider: [name], MD  ==============================   CHIEF COMPLAINT: No chief complaint on file.  SUBJECTIVE: Background This is a 73 y.o. male who has Bipolar affective disorder, depressed (HCC); Chronic bronchitis (HCC); Dementia (HCC); HLD (hyperlipidemia); DCM (dilated cardiomyopathy) (HCC); OSA treated with BiPAP; HFrEF (heart failure with reduced ejection fraction) (HCC); Ascending aorta dilatation (HCC); Alcoholism in remission [facility]); BPH (benign prostatic hyperplasia); PAC (premature atrial contraction); Primary insomnia; PUD (peptic ulcer disease); Generalized arthritis; Morbid obesity (HCC); Urinary incontinence; Hypertension; Limp; Gynecomastia, male; Chronic back pain; Stage 3 chronic kidney disease (HCC); Normocytic anemia; Tremor; Lack of appetite; Anemia of chronic disease; Syncope; Acute cystitis; Diarrhea; AKI (acute kidney injury) (HCC); Acute hyponatremia; Obesity, Class II, BMI 35-39.9; E coli bacteremia; Septic shock from UTI; Acute respiratory failure with hypoxia (HCC); Thrombocytopenia (HCC); LBBB (left bundle branch block); Colovesical fistula; Urinary tract infection without hematuria; Severe sepsis (HCC); Urinary catheter complication (HCC); Abnormal urinalysis; UTI (urinary tract infection); Acute metabolic encephalopathy; High anion gap metabolic acidosis; and GERD (gastroesophageal reflux disease) on their problem list.  History of Present Illness    {{No specialty comments available.:1}{ Problem List as of 08/14/2023 Reviewed: 07/12/2023  3:55 PM by Donzetta Starch, CMA    Abnormal urinalysis   Acute cystitis   Last Assessment & Plan 05/12/2023 Hospital Encounter Edited 05/14/2023  2:59 PM by Carollee Herter, DO  05-13-2023  continue with IV rocephin. Awaiting urine cx 05-14-2023 E.coli from blood cx. Urine cx growing e. Coli. Awaiting final sensitivities. Continue IV rocephin 2 gram daily.      Acute hyponatremia   Last Assessment & Plan 05/12/2023 Hospital Encounter Edited 05/14/2023  3:01 PM by Carollee Herter, DO  05-13-2023 resolved with IVF. Likely hypovolemic hyponatremia.  05-14-2023 resolved after IVF. Hyponatremia due to hypovolemia.      Acute metabolic encephalopathy   Acute respiratory failure with hypoxia [facility])   Last Assessment & Plan 06/02/2023 Hospital Encounter Written 06/02/2023  8:15 AM by Orland Mustard, MD  Noted to have oxygen to 88% on room air. Unsure if secondary to his OSA as supposed to wear bipap at night -no acute finding on CXR -no s/sx of volume overload/CHF exacerbation  -not tachycardic with no leg swelling to suggest PE -treat OSA, wean as tolerated       AKI (acute kidney injury) [facility])   Last Assessment & Plan 07/01/2023 Hospital Encounter Written 07/01/2023 10:39 PM by Nolberto Hanlon, MD  Patient has chronic BPH as well as documented urinary obstruction.  Patient's Foley catheter was removed approximately 5 days ago because he was having discomfort from the catheter.  Here on ER evaluation, patient was noted to have at least 500 cc of urine in the bladder that could not be voided by the patient.  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  Foley catheter has been replaced.  We will monitor ins and outs.  Patient has received 3 L of lactated Ringer's.  We will trend the creatinine.      Alcoholism in remission [facility])   Last Assessment & Plan 03/14/2022 Office Visit Written 03/14/2022  1:22 PM by [name], MD  Still in remission Encouraged continue suport group attendance.      Anemia of  chronic disease   Ascending aorta dilatation (HCC)   Bipolar affective disorder, depressed [facility])   Last Assessment & Plan 06/02/2023 Hospital Encounter Written  06/02/2023  8:08 AM by Orland Mustard, MD  Continue Abilify/Depakote/BuSpar/trazodone/Remeron      BPH (benign prostatic hyperplasia)   Last Assessment & Plan 06/02/2023 Hospital Encounter Written 06/02/2023  8:12 AM by Orland Mustard, MD  Continue flomax       Chronic back pain   Chronic bronchitis (HCC)   Colovesical fistula   DCM (dilated cardiomyopathy) [facility])   Last Assessment & Plan 03/07/2023 Office Visit Written 03/07/2023  3:18 PM by [name], MD  There has been an improvement in his heart function from the previous echocardiogram. He is scheduled for another echocardiogram. We will continue with the scheduled echocardiogram.      Dementia [facility])   Last Assessment & Plan 07/01/2023 Hospital Encounter Written 07/01/2023 10:44 PM by Nolberto Hanlon, MD  Chronic.      Diarrhea   Last Assessment & Plan 05/12/2023 Hospital Encounter Edited 05/14/2023  3:00 PM by Carollee Herter, DO  05-13-2023 awaiting GI diarrhea panel and C. Diff panel. Doubt C. Diff. 05-14-2023 awaiting c. Diff and GI diarrhea panel.      E coli bacteremia   Last Assessment & Plan 05/12/2023 Hospital Encounter Edited 05/14/2023  2:58 PM by Carollee Herter, DO  05-14-2023 admission blood cx growing E. Coli. Remains on IV rocephin 2 gm qday. Awaiting final sensitivities.      Generalized arthritis   GERD (gastroesophageal reflux disease)   Gynecomastia, male   HFrEF (heart failure with reduced ejection fraction) [facility])   Last Assessment & Plan 06/02/2023 Hospital Encounter Written 06/02/2023  9:32 AM by Orland Mustard, MD  Recent echo 12/24: normal/low EF of 50-55%. Mild LVH. Normal diastolic function.  Watch volume with IVF resus Strict I/O Holding meds with septic shock       High anion gap metabolic acidosis   HLD (hyperlipidemia)   Last Assessment & Plan 06/02/2023 Hospital Encounter Written 06/02/2023  8:13 AM by Orland Mustard, MD  Continue pravachol       Hypertension   Last Assessment & Plan 06/24/2023  Office Visit Written 06/24/2023  7:50 PM by [name], MD  Continue midodrine for now while holding blood pressure medication(s) - might be able to taper off at nausea with vomiting in 2 weeks.  Encouraged patient to continue holding blood pressure medication(s) and so no change from hospital discharge instruction medication(s) list.      Lack of appetite   Last Assessment & Plan 05/08/2023 Office Visit Written 05/08/2023  8:13 PM by [name], MD  Assessment: - Progressive appetite loss over 3-4 months - Likely medication-induced - Total weight loss from 330 to 253 lbs over extended period - Patient satisfied with weight loss but concerned about health implications  Plan: - Monitor weight and nutritional status - Coordinate with psychiatry regarding medication adjustments - Lab work ordered to evaluate for underlying causes - Follow-up in one month to reassess      LBBB (left bundle branch block)   Last Assessment & Plan 06/02/2023 Hospital Encounter Written 06/02/2023 12:55 PM by Orland Mustard, MD  -appears new -check troponin -recent echo -repeat EKG in AM       Limp   Morbid obesity (HCC)   Normocytic anemia   Last Assessment & Plan 06/02/2023 Hospital Encounter Written 06/02/2023 12:46 PM by Orland Mustard, MD  Recent baseline of 8.5 since December,  stable, but downtrending since 05/2023 Type and screen B12/folate and iron studies done 05/2023 Monitor closely with frank hematuria       Obesity, Class II, BMI 35-39.9   Last Assessment & Plan 05/12/2023 Hospital Encounter Written 05/13/2023  4:40 PM by Carollee Herter, DO  05-13-2023 BMI 38.90      OSA treated with BiPAP   Last Assessment & Plan 06/02/2023 Hospital Encounter Edited 06/02/2023  9:24 AM by Orland Mustard, MD  Intolerant to bipap       PAC (premature atrial contraction)   Primary insomnia   PUD (peptic ulcer disease)   Septic shock from UTI   Last Assessment & Plan 06/02/2023 Hospital Encounter  Edited 06/02/2023 12:48 PM by Orland Mustard, MD  73 year old presenting with unwitnessed falls, decreased PO intake and confusion found to be in septic shock from what appears to be a UTI -admit to progressive, ICU has been consulted in case does not respond to IVF resuscitation  -given 500cc in ED at time of admit. 2L have been ordered and running.  -BC/urine cultures pending -initial lactic acid wnl, repeat pending  -UA infected with frank hematuria from urethra, culture pending. 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 Will repeat CT abdomen/pelvis with contrast today with frank hematuria, but antibiotics treatment for this. 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 Discussed with urology, will place foley and they will follow.  -adding on flagyl for anaerobic coverage  -general surgery consult, but likely will need this followed up outpatient once urosepsis resolves       Severe sepsis [facility])   Last Assessment & Plan 07/12/2023 Office Visit Written 07/12/2023  4:50 PM by [name], MD  Sepsis hr 122 with AMS, severe prostate pain with resistant urinary tract infection (UTI) suspicious prostatic source. Possible sepsis secondary to prostatitis or another infection. Symptoms include altered mental status, weakness, and potential systemic infection. Blood work is necessary to check for sepsis, and monitoring heart rate and fever is crucial. Send to the hospital for sepsis evaluation. Perform blood work for sepsis indicators. Administer appropriate antibiotics and supportive care.      Stage 3 chronic kidney disease  [facility])   Last Assessment & Plan 07/12/2023 Office Visit Written 07/12/2023  4:50 PM by [name], MD  Recheck kidneys urine dark      Syncope   Last Assessment & Plan 05/12/2023 Hospital Encounter Written 05/13/2023  4:35 PM by Carollee Herter, DO  05-13-2023 admitted after syncopal episode. Pt states he has been feeling poorly for several days to 1-2 week prior to admission. No head trauma. Like due to dehydration. Echo shows improved LVEF now 55%      Thrombocytopenia [facility])   Last Assessment & Plan 06/02/2023 Hospital Encounter Written 06/02/2023 12:50 PM by Orland Mustard, MD  In setting of sepsis Will monitor, hold VTE chemoprophylaxis for now with low platelets + frank bleeding from meatus       Tremor   Last Assessment & Plan 06/02/2023 Hospital Encounter Written 06/02/2023  9:20 AM by Orland Mustard, MD  Continue mysoline  Urinary catheter complication [facility])   Last Assessment & Plan 06/24/2023 Office Visit Written 06/24/2023  7:50 PM by [name], MD  Experiencing significant discomfort and pain from a Foley catheter placed two weeks ago due to kidney failure from a severe pseudomonal UTI, with ? concerns about a potential prostate blockage or fistula contributing to the UTI. There is a strong desire to have the catheter removed. Discussed risks of removal, including the low likelihood of needing an ER visit if unable to urinate, and the possibility of returning to the hospital. He agreed and we did remove the Foley catheter, provide a handout on when to go to the ER if unable to urinate, advise drinking plenty of water, and attempt to urinate within 4-6 hours. Instruct to expect some burning and irritation, which is normal. Schedule follow-up in two weeks.      Urinary incontinence   Last Assessment & Plan 11/21/2022 Office Visit Written 11/21/2022  5:13 PM by [name], MD  Today's assessment and plan included in todays problem overview update       Urinary tract  infection without hematuria   UTI (urinary tract infection)  :1}}  Visually reviewed that patient  has a past medical history of Allergy (seasonal), Ascending aorta dilatation (HCC), Cancer (HCC) (skin), DCM (dilated cardiomyopathy) (HCC), Dementia (HCC), Depression, Hyperlipidemia, Lithium toxicity (01/29/2016), OSA treated with BiPAP, SKIN CANCER, HX OF (05/10/2007), and Urinary dribbling (03/14/2022). Manually updated: No problems updated. Verbally reviewed (per [employer]-generated extraction):   Visually reviewed and manually updated: Current Outpatient Medications on File Prior to Visit  Medication Sig   ARIPiprazole (ABILIFY) 2 MG tablet Take 2 mg by mouth in the morning.   busPIRone (BUSPAR) 10 MG tablet Take 10 mg by mouth 3 (three) times daily.   calcium-vitamin D (OSCAL WITH D) 500-5 MG-MCG tablet Take 1 tablet by mouth daily with breakfast.   divalproex (DEPAKOTE ER) 250 MG 24 hr tablet Take 500 mg by mouth at bedtime.   ferrous sulfate 325 (65 FE) MG tablet Take 1 tablet (325 mg total) by mouth 2 (two) times daily with a meal.   folic acid (FOLVITE) 1 MG tablet Take 1 tablet (1 mg total) by mouth daily. (Patient taking differently: Take 1 mg by mouth in the morning.)   gabapentin (NEURONTIN) 100 MG capsule Take 1 capsule (100 mg total) by mouth 3 (three) times daily.   melatonin 3 MG TABS tablet Take 1 tablet (3 mg total) by mouth at bedtime as needed (insomnia).   midodrine (PROAMATINE) 5 MG tablet Take 1 tablet (5 mg total) by mouth 3 (three) times daily with meals.   mirtazapine (REMERON) 30 MG tablet Take 30 mg by mouth at bedtime.   Multiple Vitamin (MULTIVITAMIN) tablet Take 1 tablet by mouth daily.   Nystatin (GERHARDT'S BUTT CREAM) CREA Apply 1 Application topically 3 (three) times daily.   pantoprazole (PROTONIX) 40 MG tablet Take 1 tablet (40 mg total) by mouth at bedtime.   pravastatin (PRAVACHOL) 20 MG tablet Take 1 tablet (20 mg total) by mouth at bedtime.   primidone  (MYSOLINE) 50 MG tablet Take 1 tablet (50 mg total) by mouth 2 (two) times daily.   tamsulosin (FLOMAX) 0.4 MG CAPS capsule TAKE 1 CAPSULE BY MOUTH EVERY DAY   traZODone (DESYREL) 100 MG tablet Take 1 tablet (100 mg total) by mouth at bedtime.   venlafaxine XR (EFFEXOR-XR) 150 MG 24 hr capsule Take 300 mg by mouth daily with breakfast.  No current facility-administered medications on file prior to visit.  There are no discontinued medications.    Objective      07/19/2023    7:44 AM 07/19/2023    5:46 AM 07/18/2023   10:34 PM  Vitals with BMI  Systolic 120 122 469  Diastolic 72 78 86  Pulse 90 88 102   Wt Readings from Last 10 Encounters:  07/17/23 224 lb 10.4 oz (101.9 kg)  07/12/23 224 lb (101.6 kg)  07/04/23 230 lb 2.6 oz (104.4 kg)  06/24/23 227 lb 3.2 oz (103.1 kg)  06/07/23 244 lb 7.8 oz (110.9 kg)  05/16/23 241 lb (109.3 kg)  05/08/23 241 lb (109.3 kg)  03/07/23 256 lb 6.4 oz (116.3 kg)  02/25/23 255 lb 3.2 oz (115.8 kg)  01/15/23 258 lb 9.6 oz (117.3 kg)   Vital signs reviewed.  Nursing notes reviewed. Weight trend reviewed. Physical Exam  Physical Exam  Abnormalities and Problem-Specific physical exam findings:  ***  General Appearance:  No acute distress appreciable.   Well-groomed, healthy-appearing male.  Well proportioned with no abnormal fat distribution.  Good muscle tone. Pulmonary:  Normal work of breathing at rest, no respiratory distress apparent.    Musculoskeletal: All extremities are intact.  Neurological:  Awake, alert, oriented, and engaged.  No obvious focal neurological deficits or cognitive impairments.  Sensorium seems unclouded.   Speech is clear and coherent with logical content. Psychiatric:  Appropriate mood, pleasant and cooperative demeanor, thoughtful and engaged during the exam  {Insert previous labs (optional):23779} {See past labs  Heme  Chem  Endocrine  Serology  Results Review (optional):1} No results found for any visits on  08/14/23. No results displayed because visit has over 200 results.    No results displayed because visit has over 200 results.    Office Visit on 06/24/2023  Component Date Value   WBC 06/24/2023 8.8    RBC 06/24/2023 3.52 (L)    Hemoglobin 06/24/2023 9.6 (L)    HCT 06/24/2023 30.2 (L)    MCV 06/24/2023 85.8    MCHC 06/24/2023 31.7    RDW 06/24/2023 17.7 (H)    Platelets 06/24/2023 256.0    Neutrophils Relative % 06/24/2023 52.9    Lymphocytes Relative 06/24/2023 29.9    Monocytes Relative 06/24/2023 9.2    Eosinophils Relative 06/24/2023 5.4 (H)    Basophils Relative 06/24/2023 2.6    Neutro Abs 06/24/2023 4.7    Lymphs Abs 06/24/2023 2.6    Monocytes Absolute 06/24/2023 0.8    Eosinophils Absolute 06/24/2023 0.5    Basophils Absolute 06/24/2023 0.2 (H)    Sodium 06/24/2023 137    Potassium 06/24/2023 3.9    Chloride 06/24/2023 102    CO2 06/24/2023 28    Glucose, Bld 06/24/2023 128 (H)    BUN 06/24/2023 14    Creatinine, Ser 06/24/2023 1.23    Total Bilirubin 06/24/2023 0.3    Alkaline Phosphatase 06/24/2023 61    AST 06/24/2023 19    ALT 06/24/2023 9    Total Protein 06/24/2023 7.9    Albumin 06/24/2023 3.8    GFR 06/24/2023 57.80 (L)    Calcium 06/24/2023 9.0   No results displayed because visit has over 200 results.    No results displayed because visit has over 200 results.    Office Visit on 05/08/2023  Component Date Value   WBC 05/08/2023 6.5    RBC 05/08/2023 3.61 (L)    Platelets 05/08/2023 249.0    Hemoglobin 05/08/2023 10.1 (L)  HCT 05/08/2023 30.9 (L)    MCV 05/08/2023 85.6    MCHC 05/08/2023 32.8    RDW 05/08/2023 15.1    Ferritin 05/08/2023 >1500.0 (H)    Folate 05/08/2023 >24.2    Path Review 05/08/2023     Retic Ct Pct 05/08/2023 2.0    ABS Retic 05/08/2023 73,000    Sed Rate 05/08/2023 60 (H)    Vitamin B-12 05/08/2023 306   Appointment on 03/15/2023  Component Date Value   Area-P 1/2 03/15/2023 5.42    S' Lateral 03/15/2023 3.60     Est EF 03/15/2023 35 - 40%   Office Visit on 01/15/2023  Component Date Value   WBC 01/15/2023 8.5    RBC 01/15/2023 4.28    Hemoglobin 01/15/2023 12.5 (L)    HCT 01/15/2023 38.7 (L)    MCV 01/15/2023 90.3    MCHC 01/15/2023 32.2    RDW 01/15/2023 13.8    Platelets 01/15/2023 187.0    Neutrophils Relative % 01/15/2023 69.9    Lymphocytes Relative 01/15/2023 16.8    Monocytes Relative 01/15/2023 11.3    Eosinophils Relative 01/15/2023 1.4    Basophils Relative 01/15/2023 0.6    Neutro Abs 01/15/2023 5.9    Lymphs Abs 01/15/2023 1.4    Monocytes Absolute 01/15/2023 1.0    Eosinophils Absolute 01/15/2023 0.1    Basophils Absolute 01/15/2023 0.1    Sodium 01/15/2023 138    Potassium 01/15/2023 4.1    Chloride 01/15/2023 97    CO2 01/15/2023 33 (H)    Glucose, Bld 01/15/2023 112 (H)    BUN 01/15/2023 15    Creatinine, Ser 01/15/2023 1.16    Total Bilirubin 01/15/2023 0.5    Alkaline Phosphatase 01/15/2023 54    AST 01/15/2023 15    ALT 01/15/2023 9    Total Protein 01/15/2023 6.9    Albumin 01/15/2023 3.8    GFR 01/15/2023 62.20    Calcium 01/15/2023 9.3    Lipase 01/15/2023 15.0   Office Visit on 11/21/2022  Component Date Value   Sodium 11/21/2022 141    Potassium 11/21/2022 4.5    Chloride 11/21/2022 102    CO2 11/21/2022 31    Glucose, Bld 11/21/2022 106 (H)    BUN 11/21/2022 16    Creatinine, Ser 11/21/2022 1.12    GFR 11/21/2022 64.95    Calcium 11/21/2022 9.2    Vitamin B-12 11/21/2022 305    Folate 11/21/2022 >23.8    TSH 11/21/2022 0.968    Hgb A1c MFr Bld 11/21/2022 5.4   Lab on 08/29/2022  Component Date Value   Valproic Acid Lvl 08/29/2022 29.3 (L)    TSH 08/29/2022 1.42    HIV 1&2 Ab, 4th Generati* 08/29/2022 NON-REACTIVE    Hepatitis C Ab 08/29/2022 NON-REACTIVE    Cholesterol 08/29/2022 138    Triglycerides 08/29/2022 138.0    HDL 08/29/2022 40.80    VLDL 08/29/2022 27.6    LDL Cholesterol 08/29/2022 69    Total CHOL/HDL Ratio 08/29/2022 3     NonHDL 08/29/2022 96.89    Sodium 08/29/2022 140    Potassium 08/29/2022 5.4 No hemolysis seen (H)    Chloride 08/29/2022 101    CO2 08/29/2022 31    Glucose, Bld 08/29/2022 107 (H)    BUN 08/29/2022 20    Creatinine, Ser 08/29/2022 1.14    Total Bilirubin 08/29/2022 0.5    Alkaline Phosphatase 08/29/2022 71    AST 08/29/2022 16    ALT 08/29/2022 13    Total Protein 08/29/2022 7.3  Albumin 08/29/2022 4.3    GFR 08/29/2022 63.69    Calcium 08/29/2022 9.3    WBC 08/29/2022 7.1    RBC 08/29/2022 4.62    Hemoglobin 08/29/2022 13.7    HCT 08/29/2022 41.4    MCV 08/29/2022 89.5    MCHC 08/29/2022 33.2    RDW 08/29/2022 14.5    Platelets 08/29/2022 170.0    Neutrophils Relative % 08/29/2022 69.3    Lymphocytes Relative 08/29/2022 20.9    Monocytes Relative 08/29/2022 6.9    Eosinophils Relative 08/29/2022 2.1    Basophils Relative 08/29/2022 0.8    Neutro Abs 08/29/2022 4.9    Lymphs Abs 08/29/2022 1.5    Monocytes Absolute 08/29/2022 0.5    Eosinophils Absolute 08/29/2022 0.2    Basophils Absolute 08/29/2022 0.1   There may be more visits with results that are not included.  No image results found. CT HEAD WO CONTRAST ( ) Result Date: 07/17/2023 CLINICAL DATA:  Mental status change, unknown cause EXAM: CT HEAD WITHOUT CONTRAST TECHNIQUE: Contiguous axial images were obtained from the base of the skull through the vertex without intravenous contrast. RADIATION DOSE REDUCTION: This exam was performed according to the departmental dose-optimization program which includes automated exposure control, adjustment of the mA and/or kV according to patient size and/or use of iterative reconstruction technique. COMPARISON:  CT head 07/03/2023. FINDINGS: Brain: No evidence of acute infarction, hemorrhage, hydrocephalus, extra-axial collection or mass lesion/mass effect. Vascular: No hyperdense vessel. Skull: No acute fracture. Sinuses/Orbits: Clear sinuses.  No acute orbital findings.  Other: No mastoid effusions. IMPRESSION: No evidence of acute intracranial abnormality. Electronically Signed   By: Feliberto Harts M.D.   On: 07/17/2023 19:07   DG Chest 1 View Result Date: 07/12/2023 CLINICAL DATA:  Altered mental status.  Penis pain. EXAM: CHEST  1 VIEW COMPARISON:  07/01/2023. FINDINGS: The heart size and mediastinal contours are within normal limits. No consolidation, effusion, or pneumothorax is seen. There is a stable calcified granuloma in the mid right lung. No acute osseous abnormality. IMPRESSION: No active disease. Electronically Signed   By: Thornell Sartorius M.D.   On: 07/12/2023 20:52   CT Head Wo Contrast Result Date: 07/03/2023 CLINICAL DATA:  Head trauma, minor (Age >= 65y) EXAM: CT HEAD WITHOUT CONTRAST TECHNIQUE: Contiguous axial images were obtained from the base of the skull through the vertex without intravenous contrast. RADIATION DOSE REDUCTION: This exam was performed according to the departmental dose-optimization program which includes automated exposure control, adjustment of the mA and/or kV according to patient size and/or use of iterative reconstruction technique. COMPARISON:  CT head 06/02/2023. FINDINGS: Brain: No evidence of acute infarction, hemorrhage, hydrocephalus, extra-axial collection or mass lesion/mass effect. Similar chronic microvascular ischemic change and cerebral atrophy. Vascular: No hyperdense vessel.  Calcific atherosclerosis. Skull: No acute fracture. Sinuses/Orbits: Mostly clear sinuses.  No acute orbital findings. Other: No mastoid effusions. IMPRESSION: Stable head CT.  No evidence of acute intracranial abnormality. Electronically Signed   By: Feliberto Harts M.D.   On: 07/03/2023 00:53   DG Chest Port 1 View Result Date: 07/01/2023 CLINICAL DATA:  Possible sepsis EXAM: PORTABLE CHEST 1 VIEW COMPARISON:  06/07/2023 FINDINGS: Cardiac shadow is stable. Poor inspiratory effort is noted. Calcified granuloma is again noted in the right mid  lung. No focal infiltrate or effusion is seen. No bony abnormality is noted. IMPRESSION: Poor inspiratory effort.  No acute abnormality noted. Electronically Signed   By: Alcide Clever M.D.   On: 07/01/2023 20:14   DG CHEST PORT  1 VIEW Result Date: 06/07/2023 CLINICAL DATA:  Shortness of breath. EXAM: PORTABLE CHEST 1 VIEW COMPARISON:  Chest radiographs 06/05/2023, 06/02/2023, 05/12/2023, 01/29/2016; CT chest 03/16/2008 FINDINGS: Cardiac silhouette and mediastinal contours are within normal limits for AP technique. Unchanged chronic benign calcified granuloma within the lateral right midlung. Moderately decreased lung volumes. Mild bilateral likely chronic interstitial thickening is unchanged from prior. No pleural effusion or pneumothorax. No acute skeletal abnormality. IMPRESSION: Moderately decreased lung volumes. No acute cardiopulmonary process. Electronically Signed   By: Neita Garnet M.D.   On: 06/07/2023 11:01   DG CHEST PORT 1 VIEW Result Date: 06/05/2023 CLINICAL DATA:  Shortness of breath. EXAM: PORTABLE CHEST 1 VIEW COMPARISON:  Chest radiograph dated 06/02/2023. FINDINGS: The heart size and mediastinal contours are within normal limits. The lung volumes are low. Both lungs are clear. The visualized skeletal structures are unremarkable. IMPRESSION: Low lung volumes. No active disease. Electronically Signed   By: Romona Curls M.D.   On: 06/05/2023 12:55   CT ABDOMEN PELVIS W WO CONTRAST Result Date: 06/02/2023 CLINICAL DATA:  73 year old male with history of dementia. Suspected urolithiasis. EXAM: CT ABDOMEN AND PELVIS WITHOUT AND WITH CONTRAST TECHNIQUE: Multidetector CT imaging of the abdomen and pelvis was performed following the standard protocol before and following the bolus administration of intravenous contrast. RADIATION DOSE REDUCTION: This exam was performed according to the departmental dose-optimization program which includes automated exposure control, adjustment of the mA and/or kV  according to patient size and/or use of iterative reconstruction technique. CONTRAST:  75mL OMNIPAQUE IOHEXOL 350 MG/ML SOLN COMPARISON:  CT of the abdomen and pelvis 05/16/2023. FINDINGS: Lower chest: Calcified granuloma in the right middle lobe. Atherosclerotic calcifications in the left main, left anterior descending and right coronary arteries. Hepatobiliary: No definite suspicious cystic or solid hepatic lesions are confidently identified on today's noncontrast CT examination. Unenhanced appearance of the gallbladder is unremarkable. Pancreas: No definite pancreatic mass or peripancreatic fluid collections or inflammatory changes are noted on today's noncontrast CT examination. Spleen: Unremarkable. Adrenals/Urinary Tract: There is a large amount of gas present in the nondependent aspect of the urinary bladder. There also appears to be some debris dependently in the urinary bladder, which also contains a small amount of gas (best appreciated on axial image 91 of series 3). Superior aspect of the urinary bladder appears thickened and intimately associated with the adjacent sigmoid colon, centered around what appears to be a large diverticulum, best appreciated on coronal image 56 of series 10. Small amount of gas in the superior wall of the urinary bladder (coronal image 57 of series 10). Moderate right and mild left hydroureteronephrosis. Small amount of gas within the collecting systems of both kidneys (right greater than left). Unenhanced appearance of the kidneys and bilateral adrenal glands is otherwise unremarkable. Stomach/Bowel: Unenhanced appearance of the stomach is normal. No pathologic dilatation of small bowel or colon. 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 Normal appendix. Vascular/Lymphatic: Atherosclerosis in the abdominal aorta and pelvic vasculature. No lymphadenopathy noted in the abdomen or pelvis. Reproductive:  Prostate gland and seminal vesicles are unremarkable in appearance. Other: No significant volume of ascites.  No pneumoperitoneum. Musculoskeletal: There are no aggressive appearing lytic or blastic lesions noted in the visualized portions of the skeleton. IMPRESSION: 1. 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 Urologic consultation is recommended for further clinical evaluation. 2. Aortic atherosclerosis. Electronically Signed   By: Trudie Reed M.D.   On: 06/02/2023 10:19   CT Head Wo Contrast Result Date: 06/02/2023 CLINICAL DATA:  The patient fell out of bed today sustaining head and neck trauma. EXAM: CT HEAD WITHOUT CONTRAST CT CERVICAL SPINE WITHOUT CONTRAST TECHNIQUE: Multidetector CT imaging of the head and cervical spine was performed following the standard protocol without intravenous contrast. Multiplanar CT image reconstructions of the cervical spine were also generated. RADIATION DOSE REDUCTION: This exam was performed according to the departmental dose-optimization program which includes automated exposure control, adjustment of the mA and/or kV according to patient size and/or use of iterative reconstruction technique. COMPARISON:  Head CT and cervical spine CT recently both on 05/13/2023. FINDINGS: CT HEAD FINDINGS Brain: There is mild global atrophy, mild atrophic ventriculomegaly and small-vessel disease of the cerebral white matter, chronic senescent mineralization in the basal ganglia. No new abnormality concerning for a cortical based acute infarct, hemorrhage, mass or mass effect is seen and no midline shift. Basal cisterns are clear. There have been no appreciable interval changes. Vascular: No hyperdense vessel or unexpected calcification. Skull: Negative  for fractures or focal lesions. Sinuses/Orbits: Patchy membrane thickening again noted in the ethmoid air cells with mild membrane disease in the maxillary sinuses. The frontal and sphenoid sinus, bilateral mastoid air cells, and middle ears are clear. The nasal septum is S shaped. Old lens extractions with otherwise negative orbits. Other: None. CT CERVICAL SPINE FINDINGS Alignment: Reversed cervical lordosis centered at C4-5, stable, with unchanged 3 mm grade 1 C4-5 anterolisthesis. No other listhesis is seen. Bone-on-bone anterior atlantodental joint space loss is also again noted, with osteophytes. Skull base and vertebrae: There is mild osteopenia without evidence of fractures, primary bone lesions or focal pathologic process. There is chronic ankylosis across the C4-5 and C5-6 facet joints. There are anterior bridging osteophytes C4-7. Soft tissues and spinal canal: No prevertebral fluid or swelling. No visible canal hematoma. There are minimal calcifications at the carotid bifurcations. No thyroid or laryngeal mass. Disc levels: There is chronic disc collapse C5-6, C6-7, C7-T1, normal disc heights above C5. There are dorsal disc osteophyte complexes C4-5 through C7-T1, associated with spinal canal stenosis and mild spondylotic cord compression from C4-5 through C6-7. The upper levels do not show significant soft tissue or bony encroachment on the spinal canal. There is multilevel facet joint and uncinate hypertrophy, with foraminal stenosis which is severe on the left at C2-3, bilaterally severe C3-4 through C5-6, bilaterally moderate to severe C6-7. Upper chest: Negative. Other: None. IMPRESSION: 1. No acute intracranial CT findings or depressed skull fractures. 2. Atrophy and small-vessel disease. 3. 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 4. Osteopenia and degenerative change without evidence of cervical fractures. 5. Reversed cervical lordosis with grade 1 C4-5 degenerative anterolisthesis. 6.  Multilevel spinal canal and foraminal stenosis. 7. Carotid atherosclerosis. Electronically Signed   By: Almira Bar M.D.   On: 06/02/2023 07:03   CT Cervical Spine Wo Contrast Result Date: 06/02/2023 CLINICAL DATA:  The patient fell out of bed today sustaining head and neck trauma. EXAM: CT HEAD WITHOUT CONTRAST CT CERVICAL SPINE WITHOUT CONTRAST TECHNIQUE: Multidetector CT imaging of the head and cervical spine was performed following the standard protocol without intravenous contrast. Multiplanar CT image reconstructions of the cervical spine were also generated. RADIATION DOSE REDUCTION: This exam was performed according to the departmental dose-optimization program which  includes automated exposure control, adjustment of the mA and/or kV according to patient size and/or use of iterative reconstruction technique. COMPARISON:  Head CT and cervical spine CT recently both on 05/13/2023. FINDINGS: CT HEAD FINDINGS Brain: There is mild global atrophy, mild atrophic ventriculomegaly and small-vessel disease of the cerebral white matter, chronic senescent mineralization in the basal ganglia. No new abnormality concerning for a cortical based acute infarct, hemorrhage, mass or mass effect is seen and no midline shift. Basal cisterns are clear. There have been no appreciable interval changes. Vascular: No hyperdense vessel or unexpected calcification. Skull: Negative for fractures or focal lesions. Sinuses/Orbits: Patchy membrane thickening again noted in the ethmoid air cells with mild membrane disease in the maxillary sinuses. The frontal and sphenoid sinus, bilateral mastoid air cells, and middle ears are clear. The nasal septum is S shaped. Old lens extractions with otherwise negative orbits. Other: None. CT CERVICAL SPINE FINDINGS Alignment: Reversed cervical lordosis centered at C4-5, stable, with unchanged 3 mm grade 1 C4-5 anterolisthesis. No other listhesis is seen. Bone-on-bone anterior atlantodental  joint space loss is also again noted, with osteophytes. Skull base and vertebrae: There is mild osteopenia without evidence of fractures, primary bone lesions or focal pathologic process. There is chronic ankylosis across the C4-5 and C5-6 facet joints. There are anterior bridging osteophytes C4-7. Soft tissues and spinal canal: No prevertebral fluid or swelling. No visible canal hematoma. There are minimal calcifications at the carotid bifurcations. No thyroid or laryngeal mass. Disc levels: There is chronic disc collapse C5-6, C6-7, C7-T1, normal disc heights above C5. There are dorsal disc osteophyte complexes C4-5 through C7-T1, associated with spinal canal stenosis and mild spondylotic cord compression from C4-5 through C6-7. The upper levels do not show significant soft tissue or bony encroachment on the spinal canal. There is multilevel facet joint and uncinate hypertrophy, with foraminal stenosis which is severe on the left at C2-3, bilaterally severe C3-4 through C5-6, bilaterally moderate to severe C6-7. Upper chest: Negative. Other: None. IMPRESSION: 1. No acute intracranial CT findings or depressed skull fractures. 2. Atrophy and small-vessel disease. 3. 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 4. Osteopenia and degenerative change without evidence of cervical fractures. 5. Reversed cervical lordosis with grade 1 C4-5 degenerative anterolisthesis. 6. Multilevel spinal canal and foraminal stenosis. 7. Carotid atherosclerosis. Electronically Signed   By: Almira Bar M.D.   On: 06/02/2023 07:03   DG Chest Port 1 View Result Date: 06/02/2023 CLINICAL DATA:  73 year old male with possible sepsis. Fall, altered mental status. EXAM: PORTABLE CHEST 1 VIEW COMPARISON:  Portable chest 05/12/2023 and earlier. FINDINGS: Portable AP semi upright view at 0546 hours. Stable low lung volumes. More rotated to the left now. Stable cardiac size and mediastinal contours. Stable lung volumes and ventilation.  Calcified right midlung granuloma (no follow-up imaging recommended). No pneumothorax, pleural effusion, acute pulmonary opacity. Paucity of bowel gas the visible abdomen. No acute osseous abnormality identified. IMPRESSION: Chronically Low lung volumes. No acute cardiopulmonary abnormality or acute traumatic injury identified. Electronically Signed   By: Odessa Fleming M.D.   On: 06/02/2023 05:59       Assessment & Plan Stage 3a chronic kidney disease (HCC)  History of UTI  Weight loss, abnormal  Normocytic anemia  Thrombocytopenia (HCC)  Hypocalcemia  Pure hypercholesterolemia  Anemia of chronic disease  Hypoalbuminemia Nutritional markers: Prealbumin, transferrin, iron panel (especially given anemia). Inflammatory markers: CRP, ESR (if there is ongoing concern for chronic inflammation). Liver function: INR, direct bilirubin (though current AST/ALT  are normal). Proteinuria: Urinalysis with protein/creatinine ratio (especially with CKD).  Obesity, Class II, BMI 35-39.9  Enlarged prostate  Assessment and Plan Assessment & Plan     ***     Orders Placed During this Encounter:  No orders of the defined types were placed in this encounter.  No orders of the defined types were placed in this encounter.      **This document was synthesized by artificial intelligence ([employer]) using HIPAA-compliant recording of the clinical interaction;   We discussed the use of AI scribe software for clinical note transcription with the patient, who gave verbal consent to proceed.    Additional Info: This encounter employed state-of-the-art, real-time, collaborative documentation. The patient actively reviewed and assisted in updating their electronic medical record on a shared screen, ensuring transparency and facilitating joint problem-solving for the problem list, overview, and plan. This approach promotes accurate, informed care. The treatment plan was discussed and reviewed in detail,  including medication safety, potential side effects, and all patient questions. We confirmed understanding and comfort with the plan. Follow-up instructions were established, including contacting the office for any concerns, returning if symptoms worsen, persist, or new symptoms develop, and precautions for potential emergency department visits.

## 2023-08-14 NOTE — Patient Instructions (Signed)
 YOUR CARE PLAN WHAT WE FOUND TODAY You have had several bladder infections recently We think there might be a connection between your colon and bladder This connection may be causing your infections to come back Your kidney function is stable but needs watching Your weight is now steady at 224 pounds   YOUR MEDICATIONS Keep taking ALL your regular medications Take Flomax (tamsulosin) every day for your prostate Take your iron pills twice daily with food Use your sleep medication when needed at bedtime Bring all your medicine bottles to your next appointment.    EATING AND DRINKING Drink at least 8 glasses of water every day Eat more protein foods like meat, eggs, and beans Eat 3 regular meals each day Limit salty foods Try to maintain your current weight   OUR PLAN We are sending you to a special surgeon who can help with the connection between your colon and bladder. This surgeon will:  Check if there is a connection (called a "fistula") See if surgery might help stop your infections Work with your other doctors to make a plan We will call you with the date and time for your surgeon appointment.    WARNING SIGNS - CALL YOUR DOCTOR IF YOU HAVE: Fever over 100.63F Shaking chills Burning when you urinate Trouble urinating Severe pain in your belly or back Confusion or feeling very tired   FOLLOW-UP Come back to see me in 1 month We will check your lab results We will see how you're feeling Bring someone with you to help remember information   QUESTIONS? Call our office: [phone] Open Monday-Friday, 8:00 AM - 5:00 PM             Nutrition for Recovery After Illness and Weight Loss Why Nutrition is Important After illness, infection, or significant weight loss, your body needs the right nutrients to rebuild strength, support healing, and improve energy levels. Poor nutrition can slow recovery, weaken muscles, and increase the risk of future illness.  Tips  for Improving Nutrition Eat More Protein - Protein helps rebuild muscle and strengthen the immune system. Good sources: Eggs, chicken, [place], fish, lean beef, beans, tofu, Greek yogurt, cheese, and protein shakes. Aim for a source of protein with every meal and snack. Increase Calories Without Large Meals - If you have a low appetite, try small, frequent meals. Add healthy fats like olive oil, peanut butter, avocado, or nuts. Drink whole milk instead of skim or low-fat options. Use meal replacement shakes (like Ensure or Boost) between meals. Stay Hydrated - Dehydration can make fatigue and weakness worse. Drink plenty of fluids, such as water, milk, or electrolyte drinks. Limit caffeine and alcohol, which can cause dehydration. Boost Vitamins and Minerals - Certain nutrients are key to recovery. Iron (for anemia): Found in red meat, spinach, beans, and fortified cereals. Vitamin D & Calcium (for bone health): Found in dairy, fortified juices, and supplements if needed. B Vitamins (for energy): Found in whole grains, meats, and leafy greens. Improve Digestion and Nutrient Absorption Eat slowly and chew food well. Consider probiotics (yogurt, kefir) to support gut health. If experiencing diarrhea or bloating, avoid greasy foods and high-fiber foods temporarily. Address Taste Changes or Appetite Loss Try different seasonings, sauces, or marinades to make food more appealing. Eat nutrient-dense foods like nuts, cheese, and eggs even in small portions. If appetite remains low, discuss with your doctor about appetite stimulants.  When to Seek Help Continued weight loss despite trying these strategies. Difficulty swallowing or severe nausea.  Extreme fatigue, weakness, or new swelling. Persistent diarrhea or constipation. Your healthcare provider can help adjust your nutrition plan based on your needs. If needed, a dietitian referral can provide more personalized recommendations. Small Steps  Lead to Big Improvements - Start Today!

## 2023-08-15 ENCOUNTER — Other Ambulatory Visit: Payer: Self-pay | Admitting: Internal Medicine

## 2023-08-15 ENCOUNTER — Encounter: Payer: Self-pay | Admitting: Internal Medicine

## 2023-08-15 DIAGNOSIS — Z8619 Personal history of other infectious and parasitic diseases: Secondary | ICD-10-CM

## 2023-08-15 DIAGNOSIS — N390 Urinary tract infection, site not specified: Secondary | ICD-10-CM

## 2023-08-15 HISTORY — DX: Personal history of other infectious and parasitic diseases: Z86.19

## 2023-08-15 LAB — TSH RFX ON ABNORMAL TO FREE T4: TSH: 1.76 u[IU]/mL (ref 0.450–4.500)

## 2023-08-15 MED ORDER — LEVOFLOXACIN 750 MG PO TABS: 750.0000 mg | ORAL_TABLET | Freq: Every day | ORAL | 0 refills | Status: DC

## 2023-08-15 NOTE — Assessment & Plan Note (Signed)
 Diarrhea resolved after treatment(s) with vanc

## 2023-08-15 NOTE — Assessment & Plan Note (Signed)
 Urinary Tract Infection (N39.0) Assessment: Patient had a severe UTI caused by resistant Pseudomonas requiring extended antibiotic therapy and catheterization. The infection was complicated by sepsis requiring hospitalization. Current urinalysis shows persistent microscopic hematuria and pyuria suggesting possible ongoing inflammation or incomplete resolution of infection. Urine culture pending. The patient's history of BPH and colovesical fistula increase his risk for recurrent infection. Plan: Await results of urine culture ordered today Continue tamsulosin 0.4 mg daily for BPH management Encourage adequate hydration (at least 2L daily) Placed urgent urology referral for evaluation of recurrent UTIs and management of colovesical fistula Educate regarding signs/symptoms of recurrent UTI requiring prompt medical attention Surgery wanted to manage possible colovesical fistula conservatively but we will refer for them to follow due to possibility of needing to intervene.

## 2023-08-15 NOTE — Assessment & Plan Note (Signed)
 Assessment: History of BPH contributing to urinary retention and complicated UTIs. Previously required catheterization. PSA currently normal at 0.61 ng/mL. Currently on tamsulosin 0.4 mg daily. Plan: Continue tamsulosin 0.4 mg daily Urology referral to evaluate for possible surgical intervention given complications Monitor post-void residual volumes Educate on voiding techniques and schedules Follow-up Plan Schedule follow-up appointment in 1 month to review lab results and assess symptoms Urology consultation within 2-4 weeks Consider general surgery consultation for colovesical fistula Regular monitoring of renal function Medication reconciliation at each visit

## 2023-08-15 NOTE — Assessment & Plan Note (Signed)
 Low calcium levels, possibly related to dietary intake or medication. He has been advised to consider calcium and vitamin D supplementation. Check vitamin D and parathyroid hormone levels. Review current supplementation and adjust dietary recommendations based on lab results.

## 2023-08-15 NOTE — Progress Notes (Signed)
 Given the patient's history of Pseudomonas UTIs, I recommend levofloxacin as the most appropriate empiric antibiotic therapy. The patient was successfully treated with levofloxacin during their most recent hospitalization in January 2025, when cultures revealed Pseudomonas aeruginosa resistant to Zosyn (piperacillin-tazobactam). Levofloxacin provides excellent coverage against Pseudomonas species while also covering other common urinary pathogens. For this patient, I would recommend levofloxacin 750mg  once daily for 7 days. This higher dose and duration are appropriate given the patient's history of complicated UTIs with sepsis and the suspected underlying structural abnormality (colovesical fistula). The fluoroquinolone class remains one of our most effective options against Pseudomonas, and given the documented previous response to levofloxacin, this provides the most targeted approach while we await current culture results. The patient has no documented allergies to fluoroquinolones based on the available information. Alternative options could include ceftazidime or cefepime if IV therapy were warranted, but oral levofloxacin offers an appropriate outpatient solution for this particular case. Once culture and sensitivity results return, therapy can be adjusted if necessary. When prescribing, I did include appropriate warnings about possible fluoroquinolone side effects, particularly given the patient's age and comorbidities, including a warning about tendon rupture risk.

## 2023-08-15 NOTE — Assessment & Plan Note (Signed)
 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 Initial CT imaging showed gas and debris in the urinary bladder, and the pattern of recurrent Pseudomonas UTIs despite appropriate antibiotic therapy is highly suspicious for an ongoing communication between the colon and bladder. The risks of continued episodes of urosepsis outweigh the benefits of conservative management at this point. Plan: Refer to colorectal surgery for definitive evaluation and potential surgical repair of colovesical fistula Request advanced imaging to map the fistula tract if not previously performed Coordinate multidisciplinary approach between colorectal surgery and urology Continue antibiotic therapy as directed for current UTI Consider suppressive antibiotics pending surgical evaluation if recommended by specialists Educate patient on signs of recurrent infection requiring immediate attention

## 2023-08-15 NOTE — Assessment & Plan Note (Signed)
 Tremor (R25.1) Assessment: Patient reports tremor most prominent in mornings and evenings. Currently on primidone 50 mg twice daily. Unclear effectiveness. Possible contribution from medication side effects (review particularly psychiatric medications). Plan: Continue primidone 50 mg twice daily Comprehensive medication review to identify potential contributors Consider neurology referral if tremor worsens or causes functional impairment Avoid caffeine and other stimulants

## 2023-08-15 NOTE — Assessment & Plan Note (Signed)
 Anemia of Chronic Disease (D63.1) Assessment: Previous labs showed hemoglobin of 9.6 g/dL. Current CBC pending. Likely multifactorial etiology including chronic kidney disease, chronic inflammation (elevated ESR 50 mm/hr), and nutritional factors. Ferritin markedly elevated at 684 ng/mL, consistent with inflammatory state. Plan: Continue ferrous sulfate 325 mg twice daily with meals Continue folic acid 1 mg daily Monitor CBC every 3 months Reassess iron studies in 3 months Investigate and treat underlying inflammatory conditions

## 2023-08-15 NOTE — Assessment & Plan Note (Signed)
 Insomnia (G47.00) Assessment: Patient reports difficulty maintaining sleep, waking at 1-2 AM. Recently started OTC sleep aid (likely melatonin 3 mg) with some improvement. Contributing factors include psychiatric conditions, stress, and possible medication effects. Plan: Continue melatonin 3 mg at bedtime as needed Review and reinforce sleep hygiene measures Consider trazodone dosage adjustment if needed (currently 100 mg at bedtime) Coordinate with psychiatry regarding medication management

## 2023-08-15 NOTE — Assessment & Plan Note (Signed)
 Bipolar Disorder, Depressed Phase (F31.81) Assessment: History of bipolar disorder, currently in depressed phase exacerbated by family stressors. Patient uses walking and music as coping mechanisms. Current medications include aripiprazole, divalproex, buspirone, trazodone, mirtazapine, and venlafaxine. Plan: Continue current psychiatric medications Support current coping strategies (walking, music) Coordinate care with psychiatrist Consider increasing frequency of therapy sessions during periods of increased stress Monitor for any suicidal ideation

## 2023-08-15 NOTE — Assessment & Plan Note (Signed)
 Hyperlipidemia (E78.00) Assessment: Lipid panel shows low HDL of 39 mg/dL and elevated triglycerides at 168 mg/dL. Total cholesterol (118 mg/dL) and LDL (54 mg/dL) are at goal. Currently on pravastatin 20 mg daily. Plan: Continue pravastatin 20 mg daily Dietary counseling regarding triglyceride reduction (limit simple carbohydrates and alcohol) Encourage regular physical

## 2023-08-15 NOTE — Assessment & Plan Note (Signed)
 Lab Results  Component Value Date   PLT 301 07/19/2023   Resolved/removed from active problem list based on review of chart/history

## 2023-08-15 NOTE — Assessment & Plan Note (Signed)
 Chronic Kidney Disease Stage 3a (N18.31) Assessment: GFR stable but decreased at 54.54 mL/min. Current labs show elevated BUN (26), elevated urine protein/creatinine ratio (241 mg/g) and elevated microalbumin/creatinine ratio (72.2 mg/g). No significant electrolyte abnormalities. Risk factors include age, history of acute kidney injury, and recurrent UTIs. Plan: Monitor renal function with regular labs (BMP every 3 months) Blood pressure currently well-controlled; continue to target BP <130/80 Medication review to avoid nephrotoxic agents Obtain renal ultrasound to evaluate for structural changes Consider nephrology referral if GFR continues to decline Educate on renal protective diet (moderate protein, low sodium)

## 2023-08-16 DIAGNOSIS — G9341 Metabolic encephalopathy: Secondary | ICD-10-CM | POA: Diagnosis not present

## 2023-08-16 DIAGNOSIS — N3000 Acute cystitis without hematuria: Secondary | ICD-10-CM | POA: Diagnosis not present

## 2023-08-16 DIAGNOSIS — G4733 Obstructive sleep apnea (adult) (pediatric): Secondary | ICD-10-CM | POA: Diagnosis not present

## 2023-08-16 DIAGNOSIS — I420 Dilated cardiomyopathy: Secondary | ICD-10-CM | POA: Diagnosis not present

## 2023-08-16 DIAGNOSIS — F3130 Bipolar disorder, current episode depressed, mild or moderate severity, unspecified: Secondary | ICD-10-CM | POA: Diagnosis not present

## 2023-08-16 DIAGNOSIS — N179 Acute kidney failure, unspecified: Secondary | ICD-10-CM | POA: Diagnosis not present

## 2023-08-16 DIAGNOSIS — A419 Sepsis, unspecified organism: Secondary | ICD-10-CM | POA: Diagnosis not present

## 2023-08-16 DIAGNOSIS — F0393 Unspecified dementia, unspecified severity, with mood disturbance: Secondary | ICD-10-CM | POA: Diagnosis not present

## 2023-08-16 DIAGNOSIS — R6520 Severe sepsis without septic shock: Secondary | ICD-10-CM | POA: Diagnosis not present

## 2023-08-16 LAB — URINALYSIS W MICROSCOPIC + REFLEX CULTURE
Bilirubin Urine: NEGATIVE
Glucose, UA: NEGATIVE
Hyaline Cast: NONE SEEN /LPF
Ketones, ur: NEGATIVE
Nitrites, Initial: NEGATIVE
Protein, ur: NEGATIVE
Specific Gravity, Urine: 1.012 (ref 1.001–1.035)
Squamous Epithelial / HPF: NONE SEEN /HPF (ref <=5)
pH: 7 (ref 5.0–8.0)

## 2023-08-16 LAB — LIPID PANEL W/REFLEX DIRECT LDL
Cholesterol: 118 mg/dL (ref <200)
HDL: 39 mg/dL — ABNORMAL LOW (ref >=40)
LDL Cholesterol (Calc): 54 mg/dL
Non-HDL Cholesterol (Calc): 79 mg/dL (ref <130)
Total CHOL/HDL Ratio: 3 (calc) (ref <5.0)
Triglycerides: 168 mg/dL — ABNORMAL HIGH (ref <150)

## 2023-08-16 LAB — URINE CULTURE
MICRO NUMBER:: 16196598
Result:: NO GROWTH
SPECIMEN QUALITY:: ADEQUATE

## 2023-08-16 LAB — PARATHYROID HORMONE, INTACT (NO CA): PTH: 65 pg/mL (ref 16–77)

## 2023-08-16 LAB — IRON,TIBC AND FERRITIN PANEL
%SAT: 33 % (ref 20–48)
Ferritin: 684 ng/mL — ABNORMAL HIGH (ref 24–380)
Iron: 76 ug/dL (ref 50–180)
TIBC: 228 ug/dL — ABNORMAL LOW (ref 250–425)

## 2023-08-16 LAB — PREALBUMIN: Prealbumin: 26 mg/dL (ref 21–43)

## 2023-08-16 LAB — PROTEIN / CREATININE RATIO, URINE
Creatinine, Urine: 54 mg/dL (ref 20–320)
Protein/Creat Ratio: 241 mg/g{creat} — ABNORMAL HIGH (ref 25–148)
Protein/Creatinine Ratio: 0.241 mg/mg{creat} — ABNORMAL HIGH (ref 0.025–0.148)
Total Protein, Urine: 13 mg/dL (ref 5–25)

## 2023-08-16 LAB — CULTURE INDICATED

## 2023-08-19 ENCOUNTER — Other Ambulatory Visit: Payer: Self-pay

## 2023-08-19 ENCOUNTER — Other Ambulatory Visit (HOSPITAL_BASED_OUTPATIENT_CLINIC_OR_DEPARTMENT_OTHER): Payer: Self-pay

## 2023-08-19 DIAGNOSIS — N390 Urinary tract infection, site not specified: Secondary | ICD-10-CM

## 2023-08-19 DIAGNOSIS — N401 Enlarged prostate with lower urinary tract symptoms: Secondary | ICD-10-CM | POA: Diagnosis not present

## 2023-08-19 DIAGNOSIS — N3941 Urge incontinence: Secondary | ICD-10-CM | POA: Diagnosis not present

## 2023-08-19 DIAGNOSIS — N321 Vesicointestinal fistula: Secondary | ICD-10-CM | POA: Diagnosis not present

## 2023-08-19 MED ORDER — LEVOFLOXACIN 750 MG PO TABS
750.0000 mg | ORAL_TABLET | Freq: Every day | ORAL | 0 refills | Status: DC
  Filled 2023-08-19 (×2): qty 7, 7d supply, fill #0

## 2023-08-19 NOTE — Telephone Encounter (Signed)
 Informed patient's wife of lab results/notes. Called [facility] that the [facility] for the UTI was sent to and it went to vm. After wife told me that they have not been able to reach anyone at the pharmacy. I sent it to [facility] pharmacy and confirmed that it would be ready for pick up today.

## 2023-08-21 ENCOUNTER — Other Ambulatory Visit: Payer: Self-pay | Admitting: Internal Medicine

## 2023-08-21 DIAGNOSIS — F3130 Bipolar disorder, current episode depressed, mild or moderate severity, unspecified: Secondary | ICD-10-CM | POA: Diagnosis not present

## 2023-08-21 DIAGNOSIS — G9341 Metabolic encephalopathy: Secondary | ICD-10-CM | POA: Diagnosis not present

## 2023-08-21 DIAGNOSIS — N3000 Acute cystitis without hematuria: Secondary | ICD-10-CM | POA: Diagnosis not present

## 2023-08-21 DIAGNOSIS — F0393 Unspecified dementia, unspecified severity, with mood disturbance: Secondary | ICD-10-CM | POA: Diagnosis not present

## 2023-08-21 DIAGNOSIS — I420 Dilated cardiomyopathy: Secondary | ICD-10-CM | POA: Diagnosis not present

## 2023-08-21 DIAGNOSIS — A419 Sepsis, unspecified organism: Secondary | ICD-10-CM | POA: Diagnosis not present

## 2023-08-21 DIAGNOSIS — G4733 Obstructive sleep apnea (adult) (pediatric): Secondary | ICD-10-CM | POA: Diagnosis not present

## 2023-08-21 DIAGNOSIS — N179 Acute kidney failure, unspecified: Secondary | ICD-10-CM | POA: Diagnosis not present

## 2023-08-21 DIAGNOSIS — R6520 Severe sepsis without septic shock: Secondary | ICD-10-CM | POA: Diagnosis not present

## 2023-08-21 NOTE — Telephone Encounter (Signed)
 Copied from CRM [id number]. Topic: Clinical - Medication Refill >> Aug 21, 2023  9:10 AM Almira Coaster wrote: Most Recent Primary Care Visit:  Provider: [name]  Department: [facility]  Visit Type: HOSPITAL FOLLOW UP  Date: 08/14/2023  Medication: famotidine 20 mg  Has the patient contacted their pharmacy? Yes (Agent: If no, request that the patient contact the pharmacy for the refill. If patient does not wish to contact the pharmacy document the reason why and proceed with request.) (Agent: If yes, when and what did the pharmacy advise?)  Is this the correct pharmacy for this prescription? Yes If no, delete pharmacy and type the correct one.  This is the patient's preferred pharmacy:  [id number] [place], [place] - [address] [place] [address] Phone: [phone] Fax: [phone]   Has the prescription been filled recently? No  Is the patient out of the medication? Yes  Has the patient been seen for an appointment in the last year OR does the patient have an upcoming appointment? Yes  Can we respond through MyChart? Yes  Agent: Please be advised that Rx refills may take up to 3 business days. We ask that you follow-up with your pharmacy.

## 2023-08-21 NOTE — Telephone Encounter (Unsigned)
 Copied from CRM [id number]. Topic: Clinical - Medication Refill >> Aug 21, 2023  9:04 AM Almira Coaster wrote: Most Recent Primary Care Visit:  Provider: [name]  Department: [facility]  Visit Type: HOSPITAL FOLLOW UP  Date: 08/14/2023  Medication: gabapentin (NEURONTIN) 100 MG capsule & ARIPiprazole (ABILIFY) 2 MG tablet  Has the patient contacted their pharmacy? Yes, no refills available  (Agent: If no, request that the patient contact the pharmacy for the refill. If patient does not wish to contact the pharmacy document the reason why and proceed with request.) (Agent: If yes, when and what did the pharmacy advise?)  Is this the correct pharmacy for this prescription? Yes If no, delete pharmacy and type the correct one.  This is the patient's preferred pharmacy:  [id number] [place], [place] - [address] [place] [address] Phone: [phone] Fax: [phone]    Has the prescription been filled recently? No  Is the patient out of the medication? Yes  Has the patient been seen for an appointment in the last year OR does the patient have an upcoming appointment? Yes  Can we respond through MyChart? Yes  Agent: Please be advised that Rx refills may take up to 3 business days. We ask that you follow-up with your pharmacy.

## 2023-08-22 MED ORDER — ARIPIPRAZOLE 2 MG PO TABS: 2.0000 mg | ORAL_TABLET | Freq: Every morning | ORAL | 0 refills | Status: DC

## 2023-08-22 MED ORDER — GABAPENTIN 100 MG PO CAPS: 100.0000 mg | ORAL_CAPSULE | Freq: Three times a day (TID) | ORAL | 0 refills | Status: DC

## 2023-08-29 ENCOUNTER — Other Ambulatory Visit (HOSPITAL_BASED_OUTPATIENT_CLINIC_OR_DEPARTMENT_OTHER): Payer: Self-pay

## 2023-09-10 ENCOUNTER — Ambulatory Visit (INDEPENDENT_AMBULATORY_CARE_PROVIDER_SITE_OTHER): Admitting: Physician Assistant

## 2023-09-10 VITALS — BP 114/70 | HR 76 | Temp 97.2°F | Ht 73.0 in | Wt 225.6 lb

## 2023-09-10 DIAGNOSIS — J0100 Acute maxillary sinusitis, unspecified: Secondary | ICD-10-CM | POA: Diagnosis not present

## 2023-09-10 DIAGNOSIS — R051 Acute cough: Secondary | ICD-10-CM

## 2023-09-10 MED ORDER — METHYLPREDNISOLONE ACETATE 80 MG/ML IJ SUSP: 80.0000 mg | Freq: Once | INTRAMUSCULAR | Status: DC

## 2023-09-10 MED ORDER — FLUTICASONE PROPIONATE 50 MCG/ACT NA SUSP: 2.0000 | Freq: Every day | NASAL | 2 refills | Status: DC

## 2023-09-10 MED ORDER — CEFDINIR 300 MG PO CAPS: 300.0000 mg | ORAL_CAPSULE | Freq: Two times a day (BID) | ORAL | 0 refills | Status: DC

## 2023-09-10 MED ORDER — METHYLPREDNISOLONE ACETATE 80 MG/ML IJ SUSP
80.0000 mg | Freq: Once | INTRAMUSCULAR | Status: AC
  Administered 2023-09-10: 80 mg via INTRAMUSCULAR

## 2023-09-10 NOTE — Progress Notes (Signed)
 Patient ID: Calvin Escobar, male    DOB: 03-Apr-1951, 73 y.o.   MRN: [id number]   Assessment & Plan:  Acute maxillary sinusitis, recurrence not specified -     methylPREDNISolone Acetate  Acute cough  Other orders -     Cefdinir; Take 1 capsule (300 mg total) by mouth 2 (two) times daily for 7 days.  Dispense: 14 capsule; Refill: 0 -     Fluticasone Propionate; Place 2 sprays into both nostrils daily.  Dispense: 16 g; Refill: 2      Assessment and Plan Assessment & Plan Nasal Congestion and Postnasal Drip Ongoing nasal congestion and postnasal drip with clear white mucus, causing coughing and difficulty sleeping for over a week. Congestion is primarily in the nasal and sinus areas, with some mucus sensation in the ear. No COPD, asthma, or recent allergen exposure. Suspected inflammation in nasal passages and sinuses, possibly leading to sinus infection. Discussed steroid injection to reduce inflammation and potential antibiotics to prevent infection. Informed consent obtained for steroid injection, preferred over tablets for ease of use. - Administer 80 mg Depo-Medrol injection to reduce inflammation. - Prescribe Flonase nasal spray, instructing use once daily after showering, with the spray angled towards the ear. - Prescribe cefdinir 300 mg twice daily for 5 to 7 days to prevent potential sinus infection. - Recommend over-the-counter nasal saline spray for regular use to cleanse nasal passages.  Follow-up Scheduled follow-up with primary care provider, Dr. Jon Billings, on April 14th to assess treatment effectiveness and address ongoing issues. Discussed potential referral to clinical pharmacy team for medication management and adherence support. - Ensure follow-up appointment with Dr. Jon Billings on April 14th. - Consider referral to clinical pharmacy team for medication management and adherence support.      No follow-ups on file.    Subjective:    Chief Complaint   Patient presents with   Allergic Rhinitis     Pt in office c/o runny nose that is constant and at times stuffed up; pt is constantly spitting up phlegm; coughing all night long, but isn't having trouble sleeping with taking medication;  symptoms were mild last week and getting worse. Pt unable to confirm medications he is taking today; states his wife handles his medications;     HPI Discussed the use of AI scribe software for clinical note transcription with the patient, who gave verbal consent to proceed.  History of Present Illness Calvin Escobar is a 73 year old male who presents with persistent nasal congestion and phlegm production.  He has been experiencing persistent nasal congestion and phlegm production for the past week, which has progressively worsened. The phlegm is clear white, causing coughing and disrupting sleep. No shortness of breath is noted, but there is difficulty breathing due to nasal blockage. There has been no color change in the mucus.  He has not been using any nasal sprays or medications for his symptoms and is unsure of what products to use. This nasal issue is a recurring problem, occurring periodically without a known cause.  No history of COPD, asthma, or other lung issues. No fever or chills, but he generally feels cool and dresses warmly for comfort. He has not been spending time outdoors recently and does not believe pollen is a contributing factor, as he is mostly indoors or in a car.  He was hospitalized in February. Since his hospitalization, he has been stable except for the current nasal congestion issue. He usually sees Dr. Jon Billings for his  medical care, who is familiar with his medication regimen, which is typically managed by his wife.     Past Medical History:  Diagnosis Date   Acute cystitis 05/13/2023   Acute hyponatremia 05/13/2023   Acute metabolic encephalopathy 07/12/2023   Acute respiratory failure with hypoxia (HCC) 06/02/2023    AKI (acute kidney injury) (HCC) 05/13/2023   Allergy seasonal   Ascending aorta dilatation (HCC)    38 mm by 2D echo 04/2021   Cancer (HCC) skin   DCM (dilated cardiomyopathy) (HCC)    nonischemic with normal coronary arteries at cath 06/2019.  EF 35 to 40% on echo 04/2021   Dementia (HCC)    Depression    Diarrhea 05/13/2023   E coli bacteremia 05/14/2023   High anion gap metabolic acidosis 07/13/2023   Hyperlipidemia    Lithium toxicity 01/29/2016   OSA treated with BiPAP    PUD (peptic ulcer disease) 01/12/2015   Formatting of this note might be different from the original.  Endoscopy esophageal stricture 2016     Septic shock from UTI 06/02/2023   Severe sepsis (HCC) 06/03/2023   SKIN CANCER, HX OF 05/10/2007   Facial left check and forehead and r shoulder  F/w derm   Syncope 05/13/2023   Thrombocytopenia (HCC) 06/02/2023   Lab Results      Component    Value    Date           PLT    301    07/19/2023           PLT    324    07/18/2023           PLT    268    07/16/2023           PLT    207    07/14/2023           PLT    228    07/13/2023             Lab Results      Component    Value    Date           ESRSEDRATE    60 (H)    05/08/2023     No results found for: "CRP"          Lab Results      Component    Value       Urinary catheter complication (HCC) 06/24/2023   Urinary dribbling 03/14/2022   Urinary incontinence 11/21/2022      Urinary Incontinence: They are wearing disposable diapers daily due to urinary incontinence, primarily nocturnal, and are currently on Tamsulosin (Flomax). We will refer them to Urology for further evaluation and potential treatment options and continue Tamsulosin until they are seen by Urology.     Urinary tract infection without hematuria 06/02/2023   UTI (urinary tract infection) 07/01/2023    Past Surgical History:  Procedure Laterality Date   COLONOSCOPY     RIGHT/LEFT HEART CATH AND CORONARY ANGIOGRAPHY N/A 07/03/2019   Procedure: RIGHT/LEFT  HEART CATH AND CORONARY ANGIOGRAPHY;  Surgeon: Dolores Patty, MD;  Location: [facility];  Service: Cardiovascular;  Laterality: N/A;    Family History  Problem Relation Age of Onset   Hypertension Mother    Cancer Father    Bone cancer Sister     Social History   Tobacco Use   Smoking status: Never   Smokeless tobacco: Never  Vaping Use  Vaping status: Never Used  Substance Use Topics   Alcohol use: Yes    Alcohol/week: 14.0 standard drinks of alcohol    Types: 14 Cans of beer per week   Drug use: No     Allergies  Allergen Reactions   Albuterol Palpitations    Supraventricular Tachycardia     Review of Systems NEGATIVE UNLESS OTHERWISE INDICATED IN HPI      Objective:     BP 114/70 (BP Location: Left Arm, Patient Position: Sitting, Cuff Size: Large)   Pulse 76   Temp (!) 97.2 F (36.2 C) (Temporal)   Ht 6\' 1"  (1.854 m)   Wt 225 lb 9.6 oz (102.3 kg)   SpO2 94%   BMI 29.76 kg/m   Wt Readings from Last 3 Encounters:  09/10/23 225 lb 9.6 oz (102.3 kg)  08/14/23 224 lb (101.6 kg)  07/17/23 224 lb 10.4 oz (101.9 kg)    BP Readings from Last 3 Encounters:  09/10/23 114/70  08/14/23 98/69  07/19/23 120/72     Physical Exam Vitals and nursing note reviewed.  Constitutional:      General: He is not in acute distress.    Appearance: Normal appearance. He is not ill-appearing.  HENT:     Head: Normocephalic.     Right Ear: Ear canal and external ear normal. A middle ear effusion is present.     Left Ear: Ear canal and external ear normal. A middle ear effusion is present.     Nose: Congestion present.     Right Turbinates: Enlarged.     Left Turbinates: Enlarged.     Right Sinus: Maxillary sinus tenderness present.     Left Sinus: Maxillary sinus tenderness present.     Mouth/Throat:     Mouth: Mucous membranes are moist.     Pharynx: No oropharyngeal exudate or posterior oropharyngeal erythema.  Eyes:     Extraocular Movements:  Extraocular movements intact.     Conjunctiva/sclera: Conjunctivae normal.     Pupils: Pupils are equal, round, and reactive to light.  Cardiovascular:     Rate and Rhythm: Normal rate and regular rhythm.     Pulses: Normal pulses.     Heart sounds: Normal heart sounds. No murmur heard. Pulmonary:     Effort: Pulmonary effort is normal. No respiratory distress.     Breath sounds: Normal breath sounds. No wheezing.  Musculoskeletal:     Cervical back: Normal range of motion.  Skin:    General: Skin is warm.  Neurological:     Mental Status: He is alert and oriented to person, place, and time.  Psychiatric:        Mood and Affect: Mood normal.        Behavior: Behavior normal.             Ivyonna Hoelzel M Reyaansh Merlo, PA-C

## 2023-09-10 NOTE — Patient Instructions (Signed)
 Take the cefdinir (omnicef) antibiotic as directed.  Use Flonase nasal spray once daily (two squirts each nostril).  Nasal saline - see handout provided.  Recheck sooner if worse / change in symptoms such as difficulty breathing, chest pain, fever, etc.

## 2023-09-16 ENCOUNTER — Encounter: Payer: Self-pay | Admitting: Internal Medicine

## 2023-09-16 ENCOUNTER — Ambulatory Visit (INDEPENDENT_AMBULATORY_CARE_PROVIDER_SITE_OTHER): Admitting: Internal Medicine

## 2023-09-16 VITALS — BP 100/66 | HR 85 | Temp 98.0°F | Ht 73.0 in | Wt 232.4 lb

## 2023-09-16 DIAGNOSIS — N390 Urinary tract infection, site not specified: Secondary | ICD-10-CM | POA: Diagnosis not present

## 2023-09-16 DIAGNOSIS — B3749 Other urogenital candidiasis: Secondary | ICD-10-CM

## 2023-09-16 DIAGNOSIS — F3131 Bipolar disorder, current episode depressed, mild: Secondary | ICD-10-CM

## 2023-09-16 DIAGNOSIS — D500 Iron deficiency anemia secondary to blood loss (chronic): Secondary | ICD-10-CM | POA: Diagnosis not present

## 2023-09-16 DIAGNOSIS — N3000 Acute cystitis without hematuria: Secondary | ICD-10-CM | POA: Diagnosis not present

## 2023-09-16 DIAGNOSIS — I959 Hypotension, unspecified: Secondary | ICD-10-CM

## 2023-09-16 DIAGNOSIS — Z6830 Body mass index (BMI) 30.0-30.9, adult: Secondary | ICD-10-CM

## 2023-09-16 DIAGNOSIS — E66812 Obesity, class 2: Secondary | ICD-10-CM | POA: Diagnosis not present

## 2023-09-16 DIAGNOSIS — G479 Sleep disorder, unspecified: Secondary | ICD-10-CM | POA: Diagnosis not present

## 2023-09-16 DIAGNOSIS — I10 Essential (primary) hypertension: Secondary | ICD-10-CM

## 2023-09-16 DIAGNOSIS — R634 Abnormal weight loss: Secondary | ICD-10-CM | POA: Diagnosis not present

## 2023-09-16 DIAGNOSIS — Z8619 Personal history of other infectious and parasitic diseases: Secondary | ICD-10-CM

## 2023-09-16 DIAGNOSIS — K219 Gastro-esophageal reflux disease without esophagitis: Secondary | ICD-10-CM

## 2023-09-16 MED ORDER — MIRTAZAPINE 30 MG PO TABS: 30.0000 mg | ORAL_TABLET | Freq: Every day | ORAL | 3 refills | Status: DC

## 2023-09-16 MED ORDER — GABAPENTIN 100 MG PO CAPS: 100.0000 mg | ORAL_CAPSULE | Freq: Three times a day (TID) | ORAL | 3 refills | Status: DC

## 2023-09-16 MED ORDER — BUSPIRONE HCL 10 MG PO TABS: 10.0000 mg | ORAL_TABLET | Freq: Three times a day (TID) | ORAL | 3 refills | Status: AC

## 2023-09-17 LAB — IBC + FERRITIN
Ferritin: 413.1 ng/mL — ABNORMAL HIGH (ref 22.0–322.0)
Iron: 66 ug/dL (ref 42–165)
Saturation Ratios: 32.3 % (ref 20.0–50.0)
TIBC: 204.4 ug/dL — ABNORMAL LOW (ref 250.0–450.0)
Transferrin: 146 mg/dL — ABNORMAL LOW (ref 212.0–360.0)

## 2023-09-17 LAB — COMPREHENSIVE METABOLIC PANEL WITH GFR
ALT: 6 U/L (ref 0–53)
AST: 13 U/L (ref 0–37)
Albumin: 3.8 g/dL (ref 3.5–5.2)
Alkaline Phosphatase: 82 U/L (ref 39–117)
BUN: 21 mg/dL (ref 6–23)
CO2: 26 meq/L (ref 19–32)
Calcium: 8.6 mg/dL (ref 8.4–10.5)
Chloride: 106 meq/L (ref 96–112)
Creatinine, Ser: 1.16 mg/dL (ref 0.40–1.50)
GFR: 61.91 mL/min (ref >60.00)
Glucose, Bld: 124 mg/dL — ABNORMAL HIGH (ref 70–99)
Potassium: 4.8 meq/L (ref 3.5–5.1)
Sodium: 139 meq/L (ref 135–145)
Total Bilirubin: 0.3 mg/dL (ref 0.2–1.2)
Total Protein: 6.6 g/dL (ref 6.0–8.3)

## 2023-09-17 NOTE — Assessment & Plan Note (Signed)
 Psychiatric Medication Management   He recently discontinued several psychiatric medications without clear intention, with no significant changes in anxiety or depression symptoms. His medication list was reconciled with his wife, and he will continue gabapentin, buspirone, and mirtazapine. He is scheduled to see a psychiatrist for further management. Continue gabapentin, buspirone, and mirtazapine, and coordinate with the psychiatrist for further medication management.

## 2023-09-17 NOTE — Assessment & Plan Note (Signed)
>>  ASSESSMENT AND PLAN FOR OBESITY, CLASS II, BMI 35-39.9 WRITTEN ON 09/17/2023  5:26 PM BY Konnor Jorden G, MD  Weight Management   He previously lost significant weight and is now at 225 pounds from over 300 pounds. He is advised to start dietary modifications to prevent further weight gain.

## 2023-09-17 NOTE — Assessment & Plan Note (Signed)
 Hypotension   Hypotension is likely related to chronic fluid loss from frequent urination due to the bladder issue. He is not on antihypertensive medications. Recent significant blood loss in February may also contribute. Monitor blood pressure and check blood counts.

## 2023-09-17 NOTE — Progress Notes (Signed)
 ==============================  [facility]: [phone]   -- Medical Office Visit --  Patient: Calvin Escobar      Age: 73 y.o.       Sex:  male  Date:   09/16/2023 Today's Healthcare Provider: [name], MD  ==============================   Chief Complaint: Anxiety  History of Present Illness  73 year old male who presents for medication reconciliation and follow-up on urinary symptoms. He is accompanied by his wife, who assists with medication management. He was referred by his psychiatrist for management of psychiatric medications.  He experiences ongoing urinary issues, including nocturia that disrupts his sleep, waking him around 4 or 5 AM after going to bed at 10 PM. He has urinary incontinence at night, necessitating the use of protective garments. During the day, he manages to control urination by frequently visiting the bathroom. He consumes about four glasses of water daily, less than the recommended eight. He has a history of urinary tract infections and was hospitalized in February for related issues, during which he lost a significant amount of blood. No current use of a Foley catheter is reported.  He has a history of psychiatric medication use, including buspirone, mirtazapine, Effexor, and Depakote, but recently stopped all except mirtazapine. His wife confirms the current medication list. He experiences manageable anxiety and attends classes to help with it. No feelings of depression or hopelessness are reported, but he notes some sleep disturbances and occasional family-related stress. He is scheduled to see his psychiatrist in the next two weeks.  He has a history of tremors, which are worse in the morning upon waking. He was previously on primidone for tremors but is unsure if he is currently taking it. He reports that his tremors are not very visible at present.  He has experienced significant weight loss, previously  weighing over 300 pounds and now around 225 pounds. He attributes the weight loss to a decreased appetite and reduced physical activity due to weather and sinus issues. He plans to resume exercise once the weather improves.  Background: Reviewed: He has Bipolar affective disorder, depressed (HCC); Chronic bronchitis (HCC); Dementia (HCC); HLD (hyperlipidemia); DCM (dilated cardiomyopathy) (HCC); OSA treated with BiPAP; HFrEF (heart failure with reduced ejection fraction) (HCC); Ascending aorta dilatation (HCC); Alcoholism in remission [facility]); BPH (benign prostatic hyperplasia); PAC (premature atrial contraction); Primary insomnia; Generalized arthritis; Morbid obesity (HCC); Hypertension; Limp; Gynecomastia, male; Chronic back pain; Stage 3 chronic kidney disease (HCC); Normocytic anemia; Tremor; Lack of appetite; Anemia of chronic disease; Obesity, Class II, BMI 35-39.9; LBBB (left bundle branch block); Colovesical fistula; Recurrent UTI; GERD (gastroesophageal reflux disease); Weight loss, abnormal; Hypocalcemia; Hypoalbuminemia; and History of Clostridioides difficile colitis on their problem list.  Reviewed: He  has a past medical history of Acute cystitis (05/13/2023), Acute hyponatremia (05/13/2023), Acute metabolic encephalopathy (07/12/2023), Acute respiratory failure with hypoxia (HCC) (06/02/2023), AKI (acute kidney injury) (HCC) (05/13/2023), Allergy (seasonal), Ascending aorta dilatation (HCC), Cancer (HCC) (skin), DCM (dilated cardiomyopathy) (HCC), Dementia (HCC), Depression, Diarrhea (05/13/2023), E coli bacteremia (05/14/2023), High anion gap metabolic acidosis (07/13/2023), Hyperlipidemia, Lithium toxicity (01/29/2016), OSA treated with BiPAP, PUD (peptic ulcer disease) (01/12/2015), Septic shock from UTI (06/02/2023), Severe sepsis (HCC) (06/03/2023), SKIN CANCER, HX OF (05/10/2007), Syncope (05/13/2023), Thrombocytopenia (HCC) (06/02/2023), Urinary catheter complication (HCC) (06/24/2023),  Urinary dribbling (03/14/2022), Urinary incontinence (11/21/2022), Urinary tract infection without hematuria (06/02/2023), and UTI (urinary tract infection) (07/01/2023).  Manually updated: No problems updated.  Reviewed:  Allergies as of 09/16/2023 - Review  Complete 09/16/2023  Allergen Reaction Noted   Albuterol Palpitations 06/05/2023    Medications: Reviewed: Current Outpatient Medications on File Prior to Visit  Medication Sig   ARIPiprazole (ABILIFY) 2 MG tablet Take 1 tablet (2 mg total) by mouth in the morning.   melatonin 3 MG TABS tablet Take 1 tablet (3 mg total) by mouth at bedtime as needed (insomnia).   midodrine (PROAMATINE) 5 MG tablet Take 1 tablet (5 mg total) by mouth 3 (three) times daily with meals.   pravastatin (PRAVACHOL) 20 MG tablet Take 1 tablet (20 mg total) by mouth at bedtime.   primidone (MYSOLINE) 50 MG tablet Take 1 tablet (50 mg total) by mouth 2 (two) times daily.   tamsulosin (FLOMAX) 0.4 MG CAPS capsule TAKE 1 CAPSULE BY MOUTH EVERY DAY   traZODone (DESYREL) 100 MG tablet Take 1 tablet (100 mg total) by mouth at bedtime.   ferrous sulfate 325 (65 FE) MG tablet Take 1 tablet (325 mg total) by mouth 2 (two) times daily with a meal.   No current facility-administered medications on file prior to visit.   Medications Discontinued During This Encounter  Medication Reason   divalproex (DEPAKOTE ER) 250 MG 24 hr tablet Completed Course   venlafaxine XR (EFFEXOR-XR) 150 MG 24 hr capsule Completed Course   calcium-vitamin D (OSCAL WITH D) 500-5 MG-MCG tablet Completed Course   busPIRone (BUSPAR) 10 MG tablet Completed Course   folic acid (FOLVITE) 1 MG tablet Completed Course   Multiple Vitamin (MULTIVITAMIN) tablet Completed Course   pantoprazole (PROTONIX) 40 MG tablet Completed Course   mirtazapine (REMERON) 30 MG tablet Completed Course   Nystatin (GERHARDT'S BUTT CREAM) CREA Completed Course   levofloxacin (LEVAQUIN) 750 MG tablet Completed Course    gabapentin (NEURONTIN) 100 MG capsule Completed Course   cefdinir (OMNICEF) 300 MG capsule Completed Course   fluticasone (FLONASE) 50 MCG/ACT nasal spray Completed Course       Physical Exam:    09/16/2023    1:32 PM 09/10/2023   10:58 AM 09/10/2023   10:29 AM  Vitals with BMI  Height 6\' 1"   6\' 1"   Weight 232 lbs 6 oz  225 lbs 10 oz  BMI 30.67  29.77  Systolic 100  114  Diastolic 66  70  Pulse 85 76 74   Wt Readings from Last 10 Encounters:  09/16/23 232 lb 6.4 oz (105.4 kg)  09/10/23 225 lb 9.6 oz (102.3 kg)  08/14/23 224 lb (101.6 kg)  07/17/23 224 lb 10.4 oz (101.9 kg)  07/12/23 224 lb (101.6 kg)  07/04/23 230 lb 2.6 oz (104.4 kg)  06/24/23 227 lb 3.2 oz (103.1 kg)  06/07/23 244 lb 7.8 oz (110.9 kg)  05/16/23 241 lb (109.3 kg)  05/08/23 241 lb (109.3 kg)  Vital signs reviewed.  Nursing notes reviewed. Weight trend reviewed. Physical Exam  Physical Exam MEASUREMENTS: Weight- 225. NEUROLOGICAL: Cranial nerves grossly intact. Moves all extremities without gross motor or sensory deficit. Tremor is hardly visible.   General Appearance:  No acute distress appreciable.   Well-groomed, healthy-appearing male.  Well proportioned with no abnormal fat distribution.  Good muscle tone. Pulmonary:  Normal work of breathing at rest, no respiratory distress apparent. SpO2: 98 %  Musculoskeletal: All extremities are intact.  Neurological:  Awake, alert, oriented, and engaged.  No obvious focal neurological deficits or cognitive impairments.  Sensorium seems unclouded.   Speech is clear and coherent with logical content. Psychiatric:  Appropriate mood, pleasant and cooperative demeanor, thoughtful  and engaged during the exam  Results for orders placed or performed in visit on 09/16/23  Urinalysis w microscopic + reflex cultur   Specimen: Urine  Result Value Ref Range   Color, Urine YELLOW YELLOW   APPearance CLOUDY (A) CLEAR   Specific Gravity, Urine 1.010 1.001 - 1.035   pH 6.5 5.0  - 8.0   Glucose, UA NEGATIVE NEGATIVE   Bilirubin Urine NEGATIVE NEGATIVE   Ketones, ur NEGATIVE NEGATIVE   Hgb urine dipstick TRACE (A) NEGATIVE   Protein, ur NEGATIVE NEGATIVE   Nitrites, Initial NEGATIVE NEGATIVE   Leukocyte Esterase 3+ (A) NEGATIVE   WBC, UA > OR = 60 (A) 0 - 5 /HPF   RBC / HPF 3-10 (A) 0 - 2 /HPF   Squamous Epithelial / HPF NONE SEEN < OR = 5 /HPF   Bacteria, UA FEW (A) NONE SEEN /HPF   Hyaline Cast 0-5 (A) NONE SEEN /LPF   Note    Comp Met (CMET)  Result Value Ref Range   Sodium 139 135 - 145 mEq/L   Potassium 4.8 3.5 - 5.1 mEq/L   Chloride 106 96 - 112 mEq/L   CO2 26 19 - 32 mEq/L   Glucose, Bld 124 (H) 70 - 99 mg/dL   BUN 21 6 - 23 mg/dL   Creatinine, Ser 6.60 0.40 - 1.50 mg/dL   Total Bilirubin 0.3 0.2 - 1.2 mg/dL   Alkaline Phosphatase 82 39 - 117 U/L   AST 13 0 - 37 U/L   ALT 6 0 - 53 U/L   Total Protein 6.6 6.0 - 8.3 g/dL   Albumin 3.8 3.5 - 5.2 g/dL   GFR 63.01 >60.10 mL/min   Calcium 8.6 8.4 - 10.5 mg/dL  IBC + Ferritin  Result Value Ref Range   Iron 66 42 - 165 ug/dL   Transferrin 932.3 (L) 212.0 - 360.0 mg/dL   Saturation Ratios 55.7 20.0 - 50.0 %   Ferritin 413.1 (H) 22.0 - 322.0 ng/mL   TIBC 204.4 (L) 250.0 - 450.0 mcg/dL  REFLEXIVE URINE CULTURE  Result Value Ref Range   REFLEXIVE URINE CULTURE     Office Visit on 09/16/2023  Component Date Value   Color, Urine 09/16/2023 YELLOW    APPearance 09/16/2023 CLOUDY (A)    Specific Gravity, Urine 09/16/2023 1.010    pH 09/16/2023 6.5    Glucose, UA 09/16/2023 NEGATIVE    Bilirubin Urine 09/16/2023 NEGATIVE    Ketones, ur 09/16/2023 NEGATIVE    Hgb urine dipstick 09/16/2023 TRACE (A)    Protein, ur 09/16/2023 NEGATIVE    Nitrites, Initial 09/16/2023 NEGATIVE    Leukocyte Esterase 09/16/2023 3+ (A)    WBC, UA 09/16/2023 > OR = 60 (A)    RBC / HPF 09/16/2023 3-10 (A)    Squamous Epithelial / HPF 09/16/2023 NONE SEEN    Bacteria, UA 09/16/2023 FEW (A)    Hyaline Cast 09/16/2023 0-5  (A)    Note 09/16/2023     Sodium 09/16/2023 139    Potassium 09/16/2023 4.8    Chloride 09/16/2023 106    CO2 09/16/2023 26    Glucose, Bld 09/16/2023 124 (H)    BUN 09/16/2023 21    Creatinine, Ser 09/16/2023 1.16    Total Bilirubin 09/16/2023 0.3    Alkaline Phosphatase 09/16/2023 82    AST 09/16/2023 13    ALT 09/16/2023 6    Total Protein 09/16/2023 6.6    Albumin 09/16/2023 3.8    GFR 09/16/2023 61.91  Calcium 09/16/2023 8.6    Iron 09/16/2023 66    Transferrin 09/16/2023 146.0 (L)    Saturation Ratios 09/16/2023 32.3    Ferritin 09/16/2023 413.1 (H)    TIBC 09/16/2023 204.4 (L)    REFLEXIVE URINE CULTURE 09/16/2023    Office Visit on 08/14/2023  Component Date Value   TSH 08/14/2023 1.760    Cholesterol 08/14/2023 118    HDL 08/14/2023 39 (L)    Triglycerides 08/14/2023 168 (H)    LDL Cholesterol (Calc) 08/14/2023 54    Total CHOL/HDL Ratio 08/14/2023 3.0    Non-HDL Cholesterol (Cal* 08/14/2023 79    Sodium 08/14/2023 141    Potassium 08/14/2023 4.8    Chloride 08/14/2023 104    CO2 08/14/2023 29    Glucose, Bld 08/14/2023 84    BUN 08/14/2023 26 (H)    Creatinine, Ser 08/14/2023 1.29    Total Bilirubin 08/14/2023 0.2    Alkaline Phosphatase 08/14/2023 86    AST 08/14/2023 15    ALT 08/14/2023 9    Total Protein 08/14/2023 6.8    Albumin 08/14/2023 3.8    GFR 08/14/2023 54.54 (L)    Calcium 08/14/2023 8.9    Hgb A1c MFr Bld 08/14/2023 5.3    PSA 08/14/2023 0.61    Color, Urine 08/14/2023 YELLOW    APPearance 08/14/2023 CLEAR    Specific Gravity, Urine 08/14/2023 1.012    pH 08/14/2023 7.0    Glucose, UA 08/14/2023 NEGATIVE    Bilirubin Urine 08/14/2023 NEGATIVE    Ketones, ur 08/14/2023 NEGATIVE    Hgb urine dipstick 08/14/2023 2+ (A)    Protein, ur 08/14/2023 NEGATIVE    Nitrites, Initial 08/14/2023 NEGATIVE    Leukocyte Esterase 08/14/2023 TRACE (A)    WBC, UA 08/14/2023 10-20 (A)    RBC / HPF 08/14/2023 3-10 (A)    Squamous Epithelial / HPF  08/14/2023 NONE SEEN    Bacteria, UA 08/14/2023 FEW (A)    Calcium Oxalate Crystal 08/14/2023 MODERATE (A)    Hyaline Cast 08/14/2023 NONE SEEN    Note 08/14/2023     PTH 08/14/2023 65    Uric Acid, Serum 08/14/2023 6.5    Microalb, Ur 08/14/2023 3.7 (H)    Creatinine,U 08/14/2023 51.7    Microalb Creat Ratio 08/14/2023 72.2 (H)    Prealbumin 08/14/2023 26    Iron 08/14/2023 76    TIBC 08/14/2023 228 (L)    %SAT 08/14/2023 33    Ferritin 08/14/2023 684 (H)    CRP 08/14/2023 <1.0    Sed Rate 08/14/2023 50 (H)    Creatinine, Urine 08/14/2023 54    Protein/Creat Ratio 08/14/2023 241 (H)    Protein/Creatinine Ratio 08/14/2023 0.241 (H)    Total Protein, Urine 08/14/2023 13    VITD 08/14/2023 36.65    MICRO NUMBER: 08/14/2023 [id number]    SPECIMEN QUALITY: 08/14/2023 Adequate    Sample Source 08/14/2023 URINE    STATUS: 08/14/2023 FINAL    Result: 08/14/2023 No Growth    REFLEXIVE URINE CULTURE 08/14/2023    No results displayed because visit has over 200 results.    No results displayed because visit has over 200 results.    Office Visit on 06/24/2023  Component Date Value   WBC 06/24/2023 8.8    RBC 06/24/2023 3.52 (L)    Hemoglobin 06/24/2023 9.6 (L)    HCT 06/24/2023 30.2 (L)    MCV 06/24/2023 85.8    MCHC 06/24/2023 31.7    RDW 06/24/2023 17.7 (H)    Platelets 06/24/2023  256.0    Neutrophils Relative % 06/24/2023 52.9    Lymphocytes Relative 06/24/2023 29.9    Monocytes Relative 06/24/2023 9.2    Eosinophils Relative 06/24/2023 5.4 (H)    Basophils Relative 06/24/2023 2.6    Neutro Abs 06/24/2023 4.7    Lymphs Abs 06/24/2023 2.6    Monocytes Absolute 06/24/2023 0.8    Eosinophils Absolute 06/24/2023 0.5    Basophils Absolute 06/24/2023 0.2 (H)    Sodium 06/24/2023 137    Potassium 06/24/2023 3.9    Chloride 06/24/2023 102    CO2 06/24/2023 28    Glucose, Bld 06/24/2023 128 (H)    BUN 06/24/2023 14    Creatinine, Ser 06/24/2023 1.23    Total Bilirubin  06/24/2023 0.3    Alkaline Phosphatase 06/24/2023 61    AST 06/24/2023 19    ALT 06/24/2023 9    Total Protein 06/24/2023 7.9    Albumin 06/24/2023 3.8    GFR 06/24/2023 57.80 (L)    Calcium 06/24/2023 9.0   No results displayed because visit has over 200 results.    No results displayed because visit has over 200 results.    Office Visit on 05/08/2023  Component Date Value   WBC 05/08/2023 6.5    RBC 05/08/2023 3.61 (L)    Platelets 05/08/2023 249.0    Hemoglobin 05/08/2023 10.1 (L)    HCT 05/08/2023 30.9 (L)    MCV 05/08/2023 85.6    MCHC 05/08/2023 32.8    RDW 05/08/2023 15.1    Ferritin 05/08/2023 >1500.0 (H)    Folate 05/08/2023 >24.2    Path Review 05/08/2023     Retic Ct Pct 05/08/2023 2.0    ABS Retic 05/08/2023 73,000    Sed Rate 05/08/2023 60 (H)    Vitamin B-12 05/08/2023 306   Appointment on 03/15/2023  Component Date Value   Area-P 1/2 03/15/2023 5.42    S' Lateral 03/15/2023 3.60    Est EF 03/15/2023 35 - 40%   Office Visit on 01/15/2023  Component Date Value   WBC 01/15/2023 8.5    RBC 01/15/2023 4.28    Hemoglobin 01/15/2023 12.5 (L)    HCT 01/15/2023 38.7 (L)    MCV 01/15/2023 90.3    MCHC 01/15/2023 32.2    RDW 01/15/2023 13.8    Platelets 01/15/2023 187.0    Neutrophils Relative % 01/15/2023 69.9    Lymphocytes Relative 01/15/2023 16.8    Monocytes Relative 01/15/2023 11.3    Eosinophils Relative 01/15/2023 1.4    Basophils Relative 01/15/2023 0.6    Neutro Abs 01/15/2023 5.9    Lymphs Abs 01/15/2023 1.4    Monocytes Absolute 01/15/2023 1.0    Eosinophils Absolute 01/15/2023 0.1    Basophils Absolute 01/15/2023 0.1    Sodium 01/15/2023 138    Potassium 01/15/2023 4.1    Chloride 01/15/2023 97    CO2 01/15/2023 33 (H)    Glucose, Bld 01/15/2023 112 (H)    BUN 01/15/2023 15    Creatinine, Ser 01/15/2023 1.16    Total Bilirubin 01/15/2023 0.5    Alkaline Phosphatase 01/15/2023 54    AST 01/15/2023 15    ALT 01/15/2023 9    Total Protein  01/15/2023 6.9    Albumin 01/15/2023 3.8    GFR 01/15/2023 62.20    Calcium 01/15/2023 9.3    Lipase 01/15/2023 15.0   There may be more visits with results that are not included.  No image results found. CT HEAD WO CONTRAST ( ) Result Date: 07/17/2023 CLINICAL DATA:  Mental status change, unknown cause EXAM: CT HEAD WITHOUT CONTRAST TECHNIQUE: Contiguous axial images were obtained from the base of the skull through the vertex without intravenous contrast. RADIATION DOSE REDUCTION: This exam was performed according to the departmental dose-optimization program which includes automated exposure control, adjustment of the mA and/or kV according to patient size and/or use of iterative reconstruction technique. COMPARISON:  CT head 07/03/2023. FINDINGS: Brain: No evidence of acute infarction, hemorrhage, hydrocephalus, extra-axial collection or mass lesion/mass effect. Vascular: No hyperdense vessel. Skull: No acute fracture. Sinuses/Orbits: Clear sinuses.  No acute orbital findings. Other: No mastoid effusions. IMPRESSION: No evidence of acute intracranial abnormality. Electronically Signed   By: Feliberto Harts M.D.   On: 07/17/2023 19:07   DG Chest 1 View Result Date: 07/12/2023 CLINICAL DATA:  Altered mental status.  Penis pain. EXAM: CHEST  1 VIEW COMPARISON:  07/01/2023. FINDINGS: The heart size and mediastinal contours are within normal limits. No consolidation, effusion, or pneumothorax is seen. There is a stable calcified granuloma in the mid right lung. No acute osseous abnormality. IMPRESSION: No active disease. Electronically Signed   By: Thornell Sartorius M.D.   On: 07/12/2023 20:52   CT Head Wo Contrast Result Date: 07/03/2023 CLINICAL DATA:  Head trauma, minor (Age >= 65y) EXAM: CT HEAD WITHOUT CONTRAST TECHNIQUE: Contiguous axial images were obtained from the base of the skull through the vertex without intravenous contrast. RADIATION DOSE REDUCTION: This exam was performed according to the  departmental dose-optimization program which includes automated exposure control, adjustment of the mA and/or kV according to patient size and/or use of iterative reconstruction technique. COMPARISON:  CT head 06/02/2023. FINDINGS: Brain: No evidence of acute infarction, hemorrhage, hydrocephalus, extra-axial collection or mass lesion/mass effect. Similar chronic microvascular ischemic change and cerebral atrophy. Vascular: No hyperdense vessel.  Calcific atherosclerosis. Skull: No acute fracture. Sinuses/Orbits: Mostly clear sinuses.  No acute orbital findings. Other: No mastoid effusions. IMPRESSION: Stable head CT.  No evidence of acute intracranial abnormality. Electronically Signed   By: Feliberto Harts M.D.   On: 07/03/2023 00:53   DG Chest Port 1 View Result Date: 07/01/2023 CLINICAL DATA:  Possible sepsis EXAM: PORTABLE CHEST 1 VIEW COMPARISON:  06/07/2023 FINDINGS: Cardiac shadow is stable. Poor inspiratory effort is noted. Calcified granuloma is again noted in the right mid lung. No focal infiltrate or effusion is seen. No bony abnormality is noted. IMPRESSION: Poor inspiratory effort.  No acute abnormality noted. Electronically Signed   By: Alcide Clever M.D.   On: 07/01/2023 20:14      Results LABS Hb: 8 g/dL (16/1096)     Assessment & Plan Bipolar affective disorder, currently depressed, mild (HCC) Psychiatric Medication Management   He recently discontinued several psychiatric medications without clear intention, with no significant changes in anxiety or depression symptoms. His medication list was reconciled with his wife, and he will continue gabapentin, buspirone, and mirtazapine. He is scheduled to see a psychiatrist for further management. Continue gabapentin, buspirone, and mirtazapine, and coordinate with the psychiatrist for further medication management. Iron deficiency anemia due to chronic blood loss Will order lab testing to guide management.  Recurrent UTI Recurrent  Urinary Tract Infections (UTIs)   Recurrent UTIs are likely due to a suspected bladder issue, possibly a fistula. He reports nocturia, contributing to hypotension from chronic fluid loss, and experiences nocturnal urinary incontinence. A urinalysis will confirm the suspected bladder issue. Obtain a urine sample for analysis, check kidney function, and monitor blood pressure. Hypotension, unspecified hypotension type Hypotension  Hypotension is likely related to chronic fluid loss from frequent urination due to the bladder issue. He is not on antihypertensive medications. Recent significant blood loss in February may also contribute. Monitor blood pressure and check blood counts. Weight loss, abnormal Recent weight loss seems to have resolved, encouraged patient to return to diet and exercise  Sleep disturbance Sleep Disturbance   He has difficulty sleeping through the night, likely related to nocturia and possibly the current mirtazapine dose. Lowering the mirtazapine dose might improve sedation, but no changes are made at this time. Monitor sleep patterns and discuss potential mirtazapine dose adjustment with the psychiatrist. Obesity, Class II, BMI 35-39.9 Weight Management   He previously lost significant weight and is now at 225 pounds from over 300 pounds. He is advised to start dietary modifications to prevent further weight gain.  Follow-up  He is scheduled to see a psychiatrist in two weeks and follow up with internal medicine in one month to reassess the bladder issue and blood pressure. Follow up in one month with internal medicine and attend the psychiatrist appointment in two weeks.       Orders Placed During this Encounter:   Orders Placed This Encounter  Procedures   Urine Culture   Urinalysis w microscopic + reflex cultur   Comp Met (CMET)   IBC + Ferritin   REFLEXIVE URINE CULTURE   Meds ordered this encounter  Medications   mirtazapine (REMERON) 30 MG tablet    Sig:  Take 1 tablet (30 mg total) by mouth at bedtime.    Dispense:  90 tablet    Refill:  3   busPIRone (BUSPAR) 10 MG tablet    Sig: Take 1 tablet (10 mg total) by mouth 3 (three) times daily.    Dispense:  270 tablet    Refill:  3   gabapentin (NEURONTIN) 100 MG capsule    Sig: Take 1 capsule (100 mg total) by mouth 3 (three) times daily.    Dispense:  90 capsule    Refill:  3         Additional Info: This encounter employed state-of-the-art, real-time, collaborative documentation. The patient actively reviewed and assisted in updating their electronic medical record on a shared screen, ensuring transparency and facilitating joint problem-solving for the problem list, overview, and plan. This approach promotes accurate, informed care. The treatment plan was discussed and reviewed in detail, including medication safety, potential side effects, and all patient questions. We confirmed understanding and comfort with the plan. Follow-up instructions were established, including contacting the office for any concerns, returning if symptoms worsen, persist, or new symptoms develop, and precautions for potential emergency department visits.   This document was synthesized by artificial intelligence ([employer]) using HIPAA-compliant recording of the clinical interaction;   We discussed the use of AI scribe software for clinical note transcription with the patient, who gave verbal consent to proceed.

## 2023-09-17 NOTE — Assessment & Plan Note (Signed)
 Weight Management   He previously lost significant weight and is now at 225 pounds from over 300 pounds. He is advised to start dietary modifications to prevent further weight gain.

## 2023-09-17 NOTE — Assessment & Plan Note (Signed)
 Recent weight loss seems to have resolved, encouraged patient to return to diet and exercise

## 2023-09-17 NOTE — Patient Instructions (Addendum)
 VISIT SUMMARY:  You came in today for medication reconciliation and to follow up on your urinary symptoms. You were accompanied by your wife, who helps manage your medications. We discussed your ongoing urinary issues, psychiatric medication management, sleep disturbances, and recent weight loss.  YOUR PLAN:  -RECURRENT URINARY TRACT INFECTIONS (UTIS): Recurrent UTIs are likely due to a suspected bladder issue, possibly a fistula. This means there might be an abnormal connection involving your bladder. We will perform a urinalysis to confirm this and check your kidney function and blood pressure.  -HYPOTENSION: Hypotension means low blood pressure, which is likely related to chronic fluid loss from frequent urination. We will monitor your blood pressure and check your blood counts to ensure there are no other underlying issues.  -PSYCHIATRIC MEDICATION MANAGEMENT: You recently stopped several psychiatric medications. We have reconciled your medication list with your wife, and you will continue taking gabapentin, buspirone, and mirtazapine. You are scheduled to see your psychiatrist in two weeks for further management.  -SLEEP DISTURBANCE: Your difficulty sleeping through the night is likely related to nocturia and possibly the current dose of mirtazapine. We will monitor your sleep patterns and discuss potential dose adjustments with your psychiatrist.  -WEIGHT MANAGEMENT: You have lost significant weight and are now at 225 pounds from over 300 pounds. You are advised to start dietary modifications to prevent further weight gain.  INSTRUCTIONS:  Please follow up with internal medicine in one month to reassess your bladder issue and blood pressure. Also, attend your psychiatrist appointment in two weeks for further management of your psychiatric medications.  It was a pleasure seeing you today! Your health and satisfaction are our top priorities.  Calvin Hew, MD  Your Providers PCP:  [name], MD,  [phone]) Referring Provider: [name], MD,  [phone]) Care Team Provider: Quintella Reichert, MD,  [phone]) Care Team Provider: Nita Sells, MD,  [phone]) Care Team Provider: [facility] [place], [place],  [phone]) Care Team Provider: [facility],  [phone]) Care Team Provider: [facility], [place],  [phone]) Care Team Provider: Bridgett Larsson, LCSW Care Team Provider: Craige Cotta, MD,  [phone]) Care Team Provider: Sherrilyn Rist, MD,  [phone])     NEXT STEPS: [x]  Early Intervention: Schedule sooner appointment, call our on-call services, or go to emergency room if there is any significant Increase in pain or discomfort New or worsening symptoms Sudden or severe changes in your health [x]  Flexible Follow-Up: We recommend a No follow-ups on file. for optimal routine care. This allows for progress monitoring and treatment adjustments. [x]  Preventive Care: Schedule your annual preventive care visit! It's typically covered by insurance and helps identify potential health issues early. [x]  Lab & X-ray Appointments: Incomplete tests scheduled today, or call to schedule. X-rays: [facility] (M-F, 8:30am-noon or 1pm-5pm). [x]  Medical Information Release: Sign a release form at front desk to obtain relevant medical information we don't have.  MAKING THE MOST OF OUR FOCUSED 20 MINUTE APPOINTMENTS: [x]   Clearly state your top concerns at the beginning of the visit to focus our discussion [x]   If you anticipate you will need more time, please inform the front desk during scheduling - we can book multiple appointments in the same week. [x]   If you have transportation problems- use our convenient video appointments or ask about transportation support. [x]   We can get down to business faster if you use MyChart to update information before the visit and submit  non-urgent  questions before your visit. Thank you for taking the time to provide details through MyChart.  Let our nurse know and she can import this information into your encounter documents.  Arrival and Wait Times: [x]   Arriving on time ensures that everyone receives prompt attention. [x]   Early morning (8a) and afternoon (1p) appointments tend to have shortest wait times. [x]   Unfortunately, we cannot delay appointments for late arrivals or hold slots during phone calls.  Getting Answers and Following Up [x]   Simple Questions & Concerns: For quick questions or basic follow-up after your visit, reach [place] at [phone] or MyChart messaging. [x]   Complex Concerns: If your concern is more complex, scheduling an appointment might be best. Discuss this with the staff to find the most suitable option. [x]   Lab & Imaging Results: We'll contact you directly if results are abnormal or you don't use MyChart. Most normal results will be on MyChart within 2-3 business days, with a review message from Dr. Jon Billings. Haven't heard back in 2 weeks? Need results sooner? Contact us at [phone]. [x]   Referrals: Our referral coordinator will manage specialist referrals. The specialist's office should contact you within 2 weeks to schedule an appointment. Call us if you haven't heard from them after 2 weeks.  Staying Connected [x]   MyChart: Activate your MyChart for the fastest way to access results and message [place]. See the last page of this paperwork for instructions on how to activate.  Bring to Your Next Appointment [x]   Medications: Please bring all your medication bottles to your next appointment to ensure we have an accurate record of your prescriptions. [x]   Health Diaries: If you're monitoring any health conditions at home, keeping a diary of your readings can be very helpful for discussions at your next appointment.  Billing [x]   X-ray & Lab Orders: These are billed by separate companies. Contact the  invoicing company directly for questions or concerns. [x]   Visit Charges: Discuss any billing inquiries with our administrative services team.  Your Satisfaction Matters [x]   Share Your Experience: We strive for your satisfaction! If you have any complaints, or preferably compliments, please let Dr. Jon Billings know directly or contact our Practice Administrators, Edwena Felty or [employer], by asking at the front desk.   Reviewing Your Records [x]   Review this early draft of your clinical encounter notes below and the final encounter summary tomorrow on MyChart after its been completed.  All orders placed so far are visible here: Bipolar affective disorder, currently depressed, mild (HCC) -     Mirtazapine; Take 1 tablet (30 mg total) by mouth at bedtime.  Dispense: 90 tablet; Refill: 3 -     busPIRone HCl; Take 1 tablet (10 mg total) by mouth 3 (three) times daily.  Dispense: 270 tablet; Refill: 3 -     Gabapentin; Take 1 capsule (100 mg total) by mouth 3 (three) times daily.  Dispense: 90 capsule; Refill: 3 -     Comprehensive metabolic panel with GFR  Iron deficiency anemia due to chronic blood loss -     IBC + Ferritin  Recurrent UTI -     Urinalysis w microscopic + reflex cultur  Hypotension, unspecified hypotension type -     Comprehensive metabolic panel with GFR  Weight loss, abnormal  Sleep disturbance  Obesity, Class II, BMI 35-39.9  Other orders -     Urine Culture -     REFLEXIVE URINE CULTURE

## 2023-09-17 NOTE — Assessment & Plan Note (Signed)
 Recurrent Urinary Tract Infections (UTIs)   Recurrent UTIs are likely due to a suspected bladder issue, possibly a fistula. He reports nocturia, contributing to hypotension from chronic fluid loss, and experiences nocturnal urinary incontinence. A urinalysis will confirm the suspected bladder issue. Obtain a urine sample for analysis, check kidney function, and monitor blood pressure.

## 2023-09-18 LAB — URINE CULTURE
MICRO NUMBER:: 16329148
SPECIMEN QUALITY:: ADEQUATE

## 2023-09-18 LAB — URINALYSIS W MICROSCOPIC + REFLEX CULTURE
Bilirubin Urine: NEGATIVE
Glucose, UA: NEGATIVE
Ketones, ur: NEGATIVE
Nitrites, Initial: NEGATIVE
Protein, ur: NEGATIVE
Specific Gravity, Urine: 1.01 (ref 1.001–1.035)
Squamous Epithelial / HPF: NONE SEEN /HPF (ref <=5)
WBC, UA: >=60 /HPF — AB (ref 0–5)
pH: 6.5 (ref 5.0–8.0)

## 2023-09-18 LAB — CULTURE INDICATED

## 2023-09-22 ENCOUNTER — Encounter: Payer: Self-pay | Admitting: Internal Medicine

## 2023-09-22 DIAGNOSIS — B3749 Other urogenital candidiasis: Secondary | ICD-10-CM

## 2023-09-22 HISTORY — DX: Other urogenital candidiasis: B37.49

## 2023-09-22 MED ORDER — FLUCONAZOLE 200 MG PO TABS: 200.0000 mg | ORAL_TABLET | Freq: Every day | ORAL | 0 refills | Status: DC

## 2023-09-22 NOTE — Progress Notes (Signed)
 Reviewed urine culture from 09/16/2023 on Easter Sunday. Called patient with these results. Key findings: suggestion of Candida albicans infection rather than colonization, requiring prompt treatment. Clinical interpretation: Requires antifungal medication and specialist evaluation. Next steps: Will start fluconazole  today or tomorrow, schedule blood tests and urgent specialist appointment Monday morning. See Patient Message for details about the plan starting tomorrow.

## 2023-09-22 NOTE — Addendum Note (Signed)
 Addended by: Dezeray Puccio G on: 09/22/2023 09:44 AM   Modules accepted: Orders

## 2023-09-23 ENCOUNTER — Ambulatory Visit: Payer: Self-pay | Admitting: Surgery

## 2023-09-23 ENCOUNTER — Ambulatory Visit (HOSPITAL_BASED_OUTPATIENT_CLINIC_OR_DEPARTMENT_OTHER)

## 2023-09-23 DIAGNOSIS — B372 Candidiasis of skin and nail: Secondary | ICD-10-CM | POA: Diagnosis not present

## 2023-09-23 DIAGNOSIS — R413 Other amnesia: Secondary | ICD-10-CM | POA: Diagnosis not present

## 2023-09-23 DIAGNOSIS — N321 Vesicointestinal fistula: Secondary | ICD-10-CM | POA: Diagnosis not present

## 2023-09-23 DIAGNOSIS — R739 Hyperglycemia, unspecified: Secondary | ICD-10-CM

## 2023-09-23 DIAGNOSIS — R931 Abnormal findings on diagnostic imaging of heart and coronary circulation: Secondary | ICD-10-CM | POA: Diagnosis not present

## 2023-09-23 DIAGNOSIS — N390 Urinary tract infection, site not specified: Secondary | ICD-10-CM | POA: Diagnosis not present

## 2023-09-23 DIAGNOSIS — E66811 Obesity, class 1: Secondary | ICD-10-CM | POA: Diagnosis not present

## 2023-09-23 DIAGNOSIS — F3160 Bipolar disorder, current episode mixed, unspecified: Secondary | ICD-10-CM | POA: Diagnosis not present

## 2023-09-23 NOTE — Telephone Encounter (Signed)
 Spoke with pt about medication and advise pt to come in office today for blood work and urine testing. Stated he had another appt today he would come tomorrow morning for that.

## 2023-09-24 ENCOUNTER — Other Ambulatory Visit (INDEPENDENT_AMBULATORY_CARE_PROVIDER_SITE_OTHER)

## 2023-09-24 ENCOUNTER — Other Ambulatory Visit: Payer: Self-pay | Admitting: *Deleted

## 2023-09-24 ENCOUNTER — Telehealth: Payer: Self-pay

## 2023-09-24 DIAGNOSIS — N390 Urinary tract infection, site not specified: Secondary | ICD-10-CM

## 2023-09-24 DIAGNOSIS — E8809 Other disorders of plasma-protein metabolism, not elsewhere classified: Secondary | ICD-10-CM

## 2023-09-24 DIAGNOSIS — N1831 Chronic kidney disease, stage 3a: Secondary | ICD-10-CM

## 2023-09-24 DIAGNOSIS — B3749 Other urogenital candidiasis: Secondary | ICD-10-CM

## 2023-09-24 LAB — BASIC METABOLIC PANEL WITH GFR
BUN: 23 mg/dL (ref 6–23)
CO2: 27 meq/L (ref 19–32)
Calcium: 8.9 mg/dL (ref 8.4–10.5)
Chloride: 103 meq/L (ref 96–112)
Creatinine, Ser: 1.27 mg/dL (ref 0.40–1.50)
GFR: 55.53 mL/min — ABNORMAL LOW (ref >60.00)
Glucose, Bld: 97 mg/dL (ref 70–99)
Potassium: 4.9 meq/L (ref 3.5–5.1)
Sodium: 139 meq/L (ref 135–145)

## 2023-09-24 LAB — CBC WITH DIFFERENTIAL/PLATELET
Basophils Absolute: 0.1 10*3/uL (ref 0.0–0.1)
Basophils Relative: 1.3 % (ref 0.0–3.0)
Eosinophils Absolute: 0.2 10*3/uL (ref 0.0–0.7)
Eosinophils Relative: 2.4 % (ref 0.0–5.0)
HCT: 31.7 % — ABNORMAL LOW (ref 39.0–52.0)
Hemoglobin: 10.1 g/dL — ABNORMAL LOW (ref 13.0–17.0)
Lymphocytes Relative: 27.7 % (ref 12.0–46.0)
Lymphs Abs: 1.8 10*3/uL (ref 0.7–4.0)
MCHC: 31.8 g/dL (ref 30.0–36.0)
MCV: 87.7 fl (ref 78.0–100.0)
Monocytes Absolute: 0.5 10*3/uL (ref 0.1–1.0)
Monocytes Relative: 8.1 % (ref 3.0–12.0)
Neutro Abs: 4 10*3/uL (ref 1.4–7.7)
Neutrophils Relative %: 60.5 % (ref 43.0–77.0)
Platelets: 207 10*3/uL (ref 150.0–400.0)
RBC: 3.61 Mil/uL — ABNORMAL LOW (ref 4.22–5.81)
RDW: 17.3 % — ABNORMAL HIGH (ref 11.5–15.5)
WBC: 6.6 10*3/uL (ref 4.0–10.5)

## 2023-09-24 LAB — PROTIME-INR
INR: 1 ratio (ref 0.8–1.0)
Prothrombin Time: 11.1 s (ref 9.6–13.1)

## 2023-09-24 NOTE — Telephone Encounter (Signed)
 Tried calling patient to schedule in person appt for preop clearance no answer left a detailed to call back and schedule

## 2023-09-24 NOTE — Telephone Encounter (Signed)
   Pre-operative Risk Assessment    Patient Name: Calvin Escobar  DOB: Dec 28, 1950 MRN: [id number]   Date of last office visit: 02/25/23 Marlyse Single, PA-C Date of next office visit: NONE   Request for Surgical Clearance    Procedure:   ROBOTIC RESECTION OF COLON:- RECTOSIGMOID. POSSIBLE BLADDER REPAIR AND POSSIBLE OSTOMY, FLEXIBLE SIGMOIDOSCOPY  Date of Surgery:  Clearance TBD                                Surgeon:  DR Candyce Champagne, MD Surgeon's Group or Practice Name:  [facility] Phone number:  [phone] Fax number:  [phone]  ATTN: Hulda Mage, CMA   Type of Clearance Requested:   - Medical    Type of Anesthesia:  General    Additional requests/questions:    Signed, Collin Deal   09/24/2023, 11:12 AM

## 2023-09-24 NOTE — Telephone Encounter (Signed)
   Name: Calvin Escobar  DOB: 10/29/1950  MRN: [id number]  Primary Cardiologist: Gaylyn Keas, MD  Chart reviewed as part of pre-operative protocol coverage. Because of [name]'s past medical history and time since last visit, he will require a follow-up in-office visit in order to better assess preoperative cardiovascular risk.  Patient has had multiple hospitalizations since his last office visit.  Pre-op covering staff: - Please schedule appointment and call patient to inform them. If patient already had an upcoming appointment within acceptable timeframe, please add "pre-op clearance" to the appointment notes so provider is aware. - Please contact requesting surgeon's office via preferred method (i.e, phone, fax) to inform them of need for appointment prior to surgery.  Jude Norton, NP  09/24/2023, 11:44 AM

## 2023-09-25 LAB — URINE CULTURE
MICRO NUMBER:: 16359448
Result:: NO GROWTH
SPECIMEN QUALITY:: ADEQUATE

## 2023-09-26 NOTE — Telephone Encounter (Signed)
 2nd attempt to reach pt regarding surgical request and the need for an IN OFFICE appointment.  Left message for pt to call back and get that scheduled.

## 2023-09-27 NOTE — Telephone Encounter (Signed)
 Preop clearance appt now scheduled, pt agrees to appt date and time.

## 2023-09-28 ENCOUNTER — Encounter: Payer: Self-pay | Admitting: Internal Medicine

## 2023-09-28 ENCOUNTER — Other Ambulatory Visit: Payer: Self-pay | Admitting: Internal Medicine

## 2023-09-28 ENCOUNTER — Ambulatory Visit (HOSPITAL_BASED_OUTPATIENT_CLINIC_OR_DEPARTMENT_OTHER)
Admission: RE | Admit: 2023-09-28 | Discharge: 2023-09-28 | Disposition: A | Discharge status: Home / Self Care | Source: Ambulatory Visit | Attending: Internal Medicine | Admitting: Internal Medicine

## 2023-09-28 DIAGNOSIS — N400 Enlarged prostate without lower urinary tract symptoms: Secondary | ICD-10-CM | POA: Insufficient documentation

## 2023-09-28 DIAGNOSIS — B3749 Other urogenital candidiasis: Secondary | ICD-10-CM

## 2023-09-28 DIAGNOSIS — N1330 Unspecified hydronephrosis: Secondary | ICD-10-CM | POA: Diagnosis not present

## 2023-09-28 DIAGNOSIS — N390 Urinary tract infection, site not specified: Secondary | ICD-10-CM

## 2023-09-28 DIAGNOSIS — N1339 Other hydronephrosis: Secondary | ICD-10-CM | POA: Insufficient documentation

## 2023-09-28 DIAGNOSIS — N4289 Other specified disorders of prostate: Secondary | ICD-10-CM | POA: Insufficient documentation

## 2023-09-28 DIAGNOSIS — N1831 Chronic kidney disease, stage 3a: Secondary | ICD-10-CM

## 2023-09-28 NOTE — Progress Notes (Signed)
 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 Patient was sent for renal ultrasound to evaluate for fungal bezoar, renal involvement, and structural abnormalities given his history of recurrent UTIs with prior sepsis and recent culture-confirmed Candida albicans funguria. REVIEW OF DIAGNOSTIC STUDIES Renal/Urinary Tract Ultrasound (09/28/2023): Right Kidney: 9.5 x 4.8 x 4.3 cm (volume: 101 mL); 1.7 x 1.5 cm cortical cyst in superior pole; no hydronephrosis; normal parenchymal echotexture Left Kidney: 11.3 x 5.8 x 5.2 cm (volume: 179 mL); mild hydronephrosis; normal echogenicity Bladder: Physiologically distended with mass (likely prostatic) protruding into the base of the bladder No perinephric fluid collections identified Recent Laboratory Results (09/24/2023): Basic Metabolic Panel: Na 139, K 4.9, Cl 103, CO2 27, glucose 97, BUN 23, Cr 1.27 GFR: 55.53 mL/min (Stage 3a CKD) CBC: WBC 6.6, RBC 3.61 (L), Hgb 10.1 (L), Hct 31.7 (L), MCV 87.7, MCHC 31.8, RDW 17.3 (H), Platelets 207 Urine Culture: Candida albicans Prior Relevant Studies: CT Abdomen/Pelvis (06/02/2023): Showed possible colovesical fistula with gas in bladder Blood Cultures (09/28/2023): No growth to date (preliminary, 48 hours) ASSESSMENT New left hydronephrosis - Identified on today's renal ultrasound. Concerning finding in a patient with recurrent UTIs and CKD. Differential diagnosis includes obstruction from prostatic mass/BPH, ureteral stone, or extrinsic compression. Requires urgent urological evaluation. Prostatic mass protruding into bladder base - Newly identified on ultrasound. 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 Probably contributing to urinary obstruction, recurrent UTIs, and left hydronephrosis. 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 Currently on treatment with fluconazole . 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 Blood cultures negative to date. Stage 3a chronic kidney disease - Now complicated by left hydronephrosis. GFR stable at 55.53 mL/min but at risk for progression due to obstructive uropathy. Requires nephrology input. Anemia of chronic disease - Hemoglobin 10.1 g/dL with elevated RDW (42.5%) suggesting mixed inflammatory and possible iron deficiency components. 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 Pattern of recurrent polymicrobial UTIs including current fungal infection strongly supportive of diagnosis despite reportedly inconclusive cystoscopy. Contributing to recurrent UTIs and sepsis risk. PLAN Urgent Actions Taken Today: Urgent Urology Referral - Sent detailed referral with ultrasound findings requesting urgent evaluation of left hydronephrosis and prostatic mass. Requested guidance on whether patient requires inpatient admission or can be managed as outpatient with expedited appointment (within 48-72 hours). Nephrology Referral - Sent referral for evaluation of CKD now complicated by obstructive uropathy and worsening anemia. Requested appointment within 1-2 weeks. Continuation of Treatment - Patient to continue fluconazole  for candidal UTI (200mg  loading dose followed by 100mg  daily, day ___ of 14-day course). Patient Education - Provided detailed instructions regarding emergency precautions including when to go to emergency department (fever >100.56F, shaking chills, severe flank pain, difficulty urinating, hematuria, confusion, or inability to maintain oral  hydration). Decision Points: Awaiting urology input regarding need for immediate hospitalization vs. outpatient management Need for advanced imaging to be determined by urology (CT urogram vs. MRI pelvis) Potential need for colorectal surgery consultation pending urology/nephrology assessment Medical Decision Making: The complex presentation with new hydronephrosis, prostatic mass, and fungal UTI in a patient with history of urosepsis represents a high-risk situation requiring urgent multidisciplinary  management. The decision to pursue urgent referrals rather than immediate ED transfer was based on patient's current clinical stability (afebrile, normal blood pressure, no flank pain) but with clear contingency planning should symptoms worsen. Follow-up: as soon as possible

## 2023-09-29 ENCOUNTER — Encounter: Payer: Self-pay | Admitting: Internal Medicine

## 2023-09-29 NOTE — Progress Notes (Signed)
 Labwork showed everything still stable. Fungus didn't grow in the blood cultures Kidneys stable. Reason we need urology now is just the ultrasound finding of prostate/bladder mass. Review with patient I want him to call his urologist for appointment.

## 2023-09-30 LAB — CULTURE, BLOOD (SINGLE)
MICRO NUMBER:: 16359447
MICRO NUMBER:: 16359455

## 2023-10-04 DIAGNOSIS — F1011 Alcohol abuse, in remission: Secondary | ICD-10-CM | POA: Diagnosis not present

## 2023-10-04 DIAGNOSIS — F332 Major depressive disorder, recurrent severe without psychotic features: Secondary | ICD-10-CM | POA: Diagnosis not present

## 2023-10-04 DIAGNOSIS — F413 Other mixed anxiety disorders: Secondary | ICD-10-CM | POA: Diagnosis not present

## 2023-10-10 ENCOUNTER — Ambulatory Visit

## 2023-10-10 VITALS — Ht 72.0 in | Wt 234.0 lb

## 2023-10-10 DIAGNOSIS — Z0000 Encounter for general adult medical examination without abnormal findings: Secondary | ICD-10-CM

## 2023-10-10 NOTE — Patient Instructions (Addendum)
 Calvin Escobar , Thank you for taking time to come for your Medicare Wellness Visit. I appreciate your ongoing commitment to your health goals. Please review the following plan we discussed and let me know if I can assist you in the future.   Referrals/Orders/Follow-Ups/Clinician Recommendations: continue going to classes and programs for better mental health   This is a list of the screening recommended for you and due dates:  Health Maintenance  Topic Date Due   COVID-19 Vaccine (9 - Pfizer risk 2024-25 season) 08/17/2023   Flu Shot  01/03/2024   Medicare Annual Wellness Visit  10/09/2024   Colon Cancer Screening  04/24/2031   DTaP/Tdap/Td vaccine (3 - Td or Tdap) 12/04/2032   Pneumonia Vaccine  Completed   Hepatitis C Screening  Completed   Zoster (Shingles) Vaccine  Completed   HPV Vaccine  Aged Out   Meningitis B Vaccine  Aged Out    Advanced directives: (Copy Requested) Please bring a copy of your health care power of attorney and living will to the office to be added to your chart at your convenience. You can mail to [facility] [address] [place], [place] [address] or email to [email or web address]  Next Medicare Annual Wellness Visit scheduled for next year: Yes  Have you seen your provider in the last 6 months (3 months if uncontrolled diabetes)? Yes 09/03/23 and has an 10/18/23

## 2023-10-10 NOTE — Progress Notes (Signed)
 Subjective:   Calvin Escobar is a 73 y.o. who presents for a Medicare Wellness preventive visit.  Visit Complete: Virtual I connected with  Calvin Escobar on 10/10/23 by a audio enabled telemedicine application and verified that I am speaking with the correct person using two identifiers.  Patient Location: Home  Provider Location: Office/Clinic  I discussed the limitations of evaluation and management by telemedicine. The patient expressed understanding and agreed to proceed.  Vital Signs: Because this visit was a virtual/telehealth visit, some criteria may be missing or patient reported. Any vitals not documented were not able to be obtained and vitals that have been documented are patient reported.  VideoDeclined- This patient declined Librarian, academic. Therefore the visit was completed with audio only.  Persons Participating in Visit: Patient.  AWV Questionnaire: No: Patient Medicare AWV questionnaire was not completed prior to this visit.  Cardiac Risk Factors include: advanced age (>56men, >59 women);dyslipidemia;male gender;hypertension;obesity (BMI >30kg/m2)     Objective:    Today's Vitals   10/10/23 1151  Weight: 234 lb (106.1 kg)  Height: 6' (1.829 m)   Body mass index is 31.74 kg/m.     10/10/2023   11:57 AM 07/13/2023    3:20 AM 07/12/2023    8:15 PM 07/03/2023    2:13 AM 07/01/2023    7:40 PM 06/02/2023    5:33 AM 05/13/2023    2:39 PM  Advanced Directives  Does Patient Have a Medical Advance Directive? Yes No No No No No No  Type of Estate agent of [place];Living will        Copy of Healthcare Power of Attorney in Chart? No - copy requested        Would patient like information on creating a medical advance directive?  No - Patient declined  No - Patient declined No - Patient declined No - Patient declined No - Patient declined    Current Medications (verified) Outpatient Encounter Medications as  of 10/10/2023  Medication Sig   ARIPiprazole  (ABILIFY ) 2 MG tablet Take 1 tablet (2 mg total) by mouth in the morning.   busPIRone  (BUSPAR ) 10 MG tablet Take 1 tablet (10 mg total) by mouth 3 (three) times daily.   ferrous sulfate  325 (65 FE) MG tablet Take 1 tablet (325 mg total) by mouth 2 (two) times daily with a meal.   fluconazole  (DIFLUCAN ) 200 MG tablet Take 1 tablet (200 mg total) by mouth daily. Take 1 tablet on day one, then only half tablet daily until all tablets gone (15d total)   gabapentin  (NEURONTIN ) 100 MG capsule Take 1 capsule (100 mg total) by mouth 3 (three) times daily.   melatonin 3 MG TABS tablet Take 1 tablet (3 mg total) by mouth at bedtime as needed (insomnia).   midodrine  (PROAMATINE ) 5 MG tablet Take 1 tablet (5 mg total) by mouth 3 (three) times daily with meals.   mirtazapine  (REMERON ) 30 MG tablet Take 1 tablet (30 mg total) by mouth at bedtime.   pravastatin  (PRAVACHOL ) 20 MG tablet Take 1 tablet (20 mg total) by mouth at bedtime.   primidone  (MYSOLINE ) 50 MG tablet Take 1 tablet (50 mg total) by mouth 2 (two) times daily.   tamsulosin  (FLOMAX ) 0.4 MG CAPS capsule TAKE 1 CAPSULE BY MOUTH EVERY DAY   traZODone  (DESYREL ) 100 MG tablet Take 1 tablet (100 mg total) by mouth at bedtime.   No facility-administered encounter medications on file as of 10/10/2023.  Allergies (verified) Albuterol    History: Past Medical History:  Diagnosis Date   Acute cystitis 05/13/2023   Acute hyponatremia 05/13/2023   Acute metabolic encephalopathy 07/12/2023   Acute respiratory failure with hypoxia (HCC) 06/02/2023   AKI (acute kidney injury) (HCC) 05/13/2023   Allergy seasonal   Ascending aorta dilatation (HCC)    38 mm by 2D echo 04/2021   Cancer (HCC) skin   DCM (dilated cardiomyopathy) (HCC)    nonischemic with normal coronary arteries at cath 06/2019.  EF 35 to 40% on echo 04/2021   Dementia (HCC)    Depression    Diarrhea 05/13/2023   E coli bacteremia 05/14/2023    High anion gap metabolic acidosis 07/13/2023   Hyperlipidemia    Lithium  toxicity 01/29/2016   OSA treated with BiPAP    PUD (peptic ulcer disease) 01/12/2015   Formatting of this note might be different from the original.  Endoscopy esophageal stricture 2016     Septic shock from UTI 06/02/2023   Severe sepsis (HCC) 06/03/2023   SKIN CANCER, HX OF 05/10/2007   Facial left check and forehead and r shoulder  F/w derm   Syncope 05/13/2023   Thrombocytopenia (HCC) 06/02/2023   Lab Results      Component    Value    Date           PLT    301    07/19/2023           PLT    324    07/18/2023           PLT    268    07/16/2023           PLT    207    07/14/2023           PLT    228    07/13/2023             Lab Results      Component    Value    Date           ESRSEDRATE    60 (H)    05/08/2023     No results found for: "CRP"          Lab Results      Component    Value       Urinary catheter complication (HCC) 06/24/2023   Urinary dribbling 03/14/2022   Urinary incontinence 11/21/2022      Urinary Incontinence: They are wearing disposable diapers daily due to urinary incontinence, primarily nocturnal, and are currently on Tamsulosin  (Flomax ). We will refer them to Urology for further evaluation and potential treatment options and continue Tamsulosin  until they are seen by Urology.     Urinary tract infection without hematuria 06/02/2023   UTI (urinary tract infection) 07/01/2023   Past Surgical History:  Procedure Laterality Date   COLONOSCOPY     RIGHT/LEFT HEART CATH AND CORONARY ANGIOGRAPHY N/A 07/03/2019   Procedure: RIGHT/LEFT HEART CATH AND CORONARY ANGIOGRAPHY;  Surgeon: Mardell Shade, MD;  Location: [facility];  Service: Cardiovascular;  Laterality: N/A;   Family History  Problem Relation Age of Onset   Hypertension Mother    Cancer Father    Bone cancer Sister    Social History   Socioeconomic History   Marital status: Married    Spouse name: Not on file    Number of children: Not on file   Years of education: Not on file   Highest  education level: Not on file  Occupational History   Not on file  Tobacco Use   Smoking status: Never   Smokeless tobacco: Never  Vaping Use   Vaping status: Never Used  Substance and Sexual Activity   Alcohol use: Not Currently    Alcohol/week: 14.0 standard drinks of alcohol    Types: 14 Cans of beer per week   Drug use: No   Sexual activity: Not Currently  Other Topics Concern   Not on file  Social History Narrative   Not on file   Social Drivers of Health   Financial Resource Strain: Low Risk  (10/10/2023)   Overall Financial Resource Strain (CARDIA)    Difficulty of Paying Living Expenses: Not hard at all  Food Insecurity: No Food Insecurity (10/10/2023)   Hunger Vital Sign    Worried About Running Out of Food in the Last Year: Never true    Ran Out of Food in the Last Year: Never true  Transportation Needs: No Transportation Needs (10/10/2023)   PRAPARE - Administrator, Civil Service (Medical): No    Lack of Transportation (Non-Medical): No  Physical Activity: Sufficiently Active (10/10/2023)   Exercise Vital Sign    Days of Exercise per Week: 5 days    Minutes of Exercise per Session: 30 min  Stress: No Stress Concern Present (10/10/2023)   [employer] of Occupational Health - Occupational Stress Questionnaire    Feeling of Stress : Not at all  Social Connections: Moderately Isolated (10/10/2023)   Social Connection and Isolation Panel [NHANES]    Frequency of Communication with Friends and Family: More than three times a week    Frequency of Social Gatherings with Friends and Family: More than three times a week    Attends Religious Services: Never    Database administrator or Organizations: No    Attends Engineer, structural: Never    Marital Status: Married    Tobacco Counseling Counseling given: Not Answered    Clinical Intake:  Pre-visit preparation  completed: Yes  Pain : No/denies pain     BMI - recorded: 31.74 Nutritional Status: BMI > 30  Obese Diabetes: No  Lab Results  Component Value Date   HGBA1C 5.3 08/14/2023   HGBA1C 5.4 11/21/2022     How often do you need to have someone help you when you read instructions, pamphlets, or other written materials from your doctor or pharmacy?: 1 - Never  Interpreter Needed?: No  Information entered by :: Lamont Pilsner, LPN   Activities of Daily Living     10/10/2023   11:53 AM 07/13/2023    3:24 AM  In your present state of health, do you have any difficulty performing the following activities:  Hearing? 0   Vision? 0   Difficulty concentrating or making decisions? 0   Walking or climbing stairs? 0   Dressing or bathing? 0   Doing errands, shopping? 0 1  Preparing Food and eating ? N   Using the Toilet? N   In the past six months, have you accidently leaked urine? Y   Comment wears a depends   Do you have problems with loss of bowel control? N   Managing your Medications? N   Managing your Finances? N   Housekeeping or managing your Housekeeping? N     Patient Care Team: Anthon Kins, MD as PCP - General (Internal Medicine) Jacqueline Matsu, MD as PCP - Cardiology (Cardiology) Denman Fischer,  MD (Dermatology) [facility] ([facility] Health) Adriana Albany, LCSW as VBCI Care Management (Licensed Clinical Social Worker) [name] (Psychiatry) [name], Tiana Flurry, MD (Psychiatry) Dominic Friendly Cordelia Dessert, MD as Consulting Physician (Gastroenterology) Candyce Champagne, MD as Consulting Physician (Colon and Rectal Surgery) Lewis, Jasmine D, LCSW (Licensed Clinical Social Worker) Samson Croak, MD as Consulting Physician (Urology)  Indicate any recent Medical Services you may have received from other than [facility] providers in the past year (date may be approximate).     Assessment:    This is a routine wellness  examination for Ramaj.  Hearing/Vision screen Hearing Screening - Comments:: Pt denies any hearing issues  Vision Screening - Comments:: Wears rx glasses - up to date with routine eye exams with [facility] Johnetta Nab     Goals Addressed             This Visit's Progress    Patient Stated       Continue to go to classes and counseling for better health        Depression Screen     10/10/2023   11:58 AM 09/16/2023    2:10 PM 03/07/2023    1:35 PM 01/15/2023   12:02 PM 12/19/2022    1:46 PM 11/21/2022    1:41 PM 05/04/2022    1:34 PM  PHQ 2/9 Scores  PHQ - 2 Score 0 2 1 3 2 3 4   PHQ- 9 Score 0 7 4 12 11 14 14     Fall Risk     10/10/2023   12:00 PM 08/14/2023    1:18 PM 06/24/2023    1:08 PM 03/07/2023    1:04 PM 11/21/2022    1:31 PM  Fall Risk   Falls in the past year? 1 1 1  0 0  Number falls in past yr: 1 1 1  0 0  Injury with Fall? 0 1 1 0 0  Comment went to ER      Risk for fall due to : History of fall(s)   No Fall Risks Impaired mobility  Follow up Falls prevention discussed Falls evaluation completed Falls evaluation completed Falls evaluation completed Falls evaluation completed    MEDICARE RISK AT HOME:  Medicare Risk at Home Any stairs in or around the home?: Yes If so, are there any without handrails?: No Home free of loose throw rugs in walkways, pet beds, electrical cords, etc?: No Adequate lighting in your home to reduce risk of falls?: No Life alert?: No Use of a cane, walker or w/c?: No Grab bars in the bathroom?: No Shower chair or bench in shower?: No Elevated toilet seat or a handicapped toilet?: No  TIMED UP AND GO:  Was the test performed?  No  Cognitive Function: 6CIT completed        10/10/2023   12:03 PM 05/04/2022    1:44 PM  6CIT Screen  What Year? 0 points 0 points  What month? 0 points 0 points  What time? 0 points 0 points  Count back from 20 0 points 0 points  Months in reverse 0 points 0 points  Repeat phrase 0 points 8 points   Total Score 0 points 8 points    Immunizations Immunization History  Administered Date(s) Administered   Fluad Quad(high Dose 65+) 03/12/2016, 02/07/2018, 03/29/2021, 02/14/2022   Influenza, High Dose Seasonal PF 03/30/2015, 03/09/2019, 03/21/2020, 02/17/2023   Influenza,trivalent, recombinat, inj, PF 04/02/2013   Influenza-Unspecified  02/17/2023   PFIZER Comirnaty(Gray Top)Covid-19 Tri-Sucrose Vaccine 09/03/2020   PFIZER(Purple Top)SARS-COV-2 Vaccination 07/04/2019, 07/25/2019, 02/28/2020   Pfizer Covid-19 Vaccine Bivalent Booster 81yrs & up 03/29/2021, 03/03/2022   Pneumococcal Conjugate Pcv21, Polysaccharide Crm197 Conjugaf 09/02/2023   Pneumococcal Conjugate-13 10/13/2014   Pneumococcal Polysaccharide-23 11/28/2015   Tdap 10/13/2014, 12/05/2022   Unspecified SARS-COV-2 Vaccination 12/05/2022, 02/17/2023   Zoster Recombinant(Shingrix) 01/15/2019, 05/18/2019   Zoster, Live 01/15/2019    Screening Tests Health Maintenance  Topic Date Due   COVID-19 Vaccine (9 - Pfizer risk 2024-25 season) 08/17/2023   INFLUENZA VACCINE  01/03/2024   Medicare Annual Wellness (AWV)  10/09/2024   Colonoscopy  04/24/2031   DTaP/Tdap/Td (3 - Td or Tdap) 12/04/2032   Pneumonia Vaccine 62+ Years old  Completed   Hepatitis C Screening  Completed   Zoster Vaccines- Shingrix  Completed   HPV VACCINES  Aged Out   Meningococcal B Vaccine  Aged Out    Health Maintenance  Health Maintenance Due  Topic Date Due   COVID-19 Vaccine (9 - Pfizer risk 2024-25 season) 08/17/2023   Health Maintenance Items Addressed: See Nurse Notes  Additional Screening:  Vision Screening: Recommended annual ophthalmology exams for early detection of glaucoma and other disorders of the eye.  Dental Screening: Recommended annual dental exams for proper oral hygiene  Community Resource Referral / Chronic Care Management: CRR required this visit?  No   CCM required this visit?  No     Plan:     I have  personally reviewed and noted the following in the patient's chart:   Medical and social history Use of alcohol, tobacco or illicit drugs  Current medications and supplements including opioid prescriptions. Patient is not currently taking opioid prescriptions. Functional ability and status Nutritional status Physical activity Advanced directives List of other physicians Hospitalizations, surgeries, and ER visits in previous 12 months Vitals Screenings to include cognitive, depression, and falls Referrals and appointments  In addition, I have reviewed and discussed with patient certain preventive protocols, quality metrics, and best practice recommendations. A written personalized care plan for preventive services as well as general preventive health recommendations were provided to patient.     Bruno Capri, LPN   06/09/1094   After Visit Summary: (MyChart) Due to this being a telephonic visit, the after visit summary with patients personalized plan was offered to patient via MyChart   Notes: Nothing significant to report at this time.

## 2023-10-11 ENCOUNTER — Encounter (HOSPITAL_COMMUNITY): Payer: Self-pay

## 2023-10-17 ENCOUNTER — Ambulatory Visit: Attending: Cardiology | Admitting: Cardiology

## 2023-10-17 ENCOUNTER — Encounter: Payer: Self-pay | Admitting: *Deleted

## 2023-10-17 ENCOUNTER — Encounter: Payer: Self-pay | Admitting: Cardiology

## 2023-10-17 VITALS — BP 118/78 | HR 83 | Ht 72.0 in | Wt 246.0 lb

## 2023-10-17 DIAGNOSIS — Z01818 Encounter for other preprocedural examination: Secondary | ICD-10-CM

## 2023-10-17 DIAGNOSIS — E7800 Pure hypercholesterolemia, unspecified: Secondary | ICD-10-CM | POA: Diagnosis not present

## 2023-10-17 DIAGNOSIS — N321 Vesicointestinal fistula: Secondary | ICD-10-CM

## 2023-10-17 DIAGNOSIS — I77810 Thoracic aortic ectasia: Secondary | ICD-10-CM

## 2023-10-17 DIAGNOSIS — R9431 Abnormal electrocardiogram [ECG] [EKG]: Secondary | ICD-10-CM

## 2023-10-17 DIAGNOSIS — G4733 Obstructive sleep apnea (adult) (pediatric): Secondary | ICD-10-CM

## 2023-10-17 DIAGNOSIS — I5020 Unspecified systolic (congestive) heart failure: Secondary | ICD-10-CM | POA: Diagnosis not present

## 2023-10-17 NOTE — Progress Notes (Signed)
 Cardiology Office Note:   Date:  10/17/2023  ID:  Calvin Escobar, DOB May 14, 1951, MRN [id number] PCP: Calvin Kins, MD  [facility] Providers Cardiologist:  Calvin Keas, MD    History of Present Illness:   Discussed the use of AI scribe software for clinical note transcription with the patient, who gave verbal consent to proceed.  History of Present Illness Calvin Escobar is a 73 year old male with heart failure with reduced ejection fraction, non-ischemic cardiomyopathy, dilated ascending aorta, hyperlipidemia, OSA who presents for preoperative evaluation pending robotic resection of the colon with bladder repair for colovesical fistula.  He has a history of heart failure with reduced ejection fraction and non-ischemic cardiomyopathy with EF as low as 25-30%. His most recent echocardiogram from December 2024 showed an LVEF of 50-55%. He was previously on GDMT including carvedilol  and losartan  but these were discontinued earlier this year during a hospitalization. Patient admitted 4 times since December 2024 (05/12/23-05/16/23, 06/02/23-06/11/23, 07/01/23-01/31-25, 07/12/23-07/19/23). Initial admission was for E.coli bacteremia secondary to complicated UTI. 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 Following this admission had cystoscopy that reportedly did not suggest colovesical fistula. Third admission, patient presented with fever and confusion with labs consistent with UTI and AKI. Urine culture showed pseudomonas. This admission complicated by delirium and anemia. Fourth admission with sepsis from UTI, urine cultures with yeast and enterococcus. Per note review, during patient's third admission, he had hypotension and was ultimately taken off Coreg  and Losartan , started Midodrine . Since this admission, patient has remained off his prior HF GDMT.   On 09/23/23, patient was seen in clinic by Dr. Hershell Escobar with [facility]  Surgery. Per note, tentative plans for robotic rectosigmoid section with takedown of colovesical fistula.   Patient reports that since his most recent admission, he has felt well with no shortness of breath, chest pain, or leg swelling. He is able to lay flat at night without needing extra pillows and his weight has been stable around 230-240 pounds.  Despite likely colovesical fistula, he feels 'perfectly well'. He is unsure about the surgery date and wonders why he needs the surgery if he's feeling well.  He has hyperlipidemia, morbid obesity, and obstructive sleep apnea for which he was prescribed a BiPAP machine, but he does not use it due to discomfort. He experiences sleep disturbances, waking every 3-4 hours, often due to urinary issues.  He reports good exertional tolerance, able to walk almost a mile and a half, and engage in activities like dressing, bathing, and climbing stairs. He has signed up for Silver Sneakers at a local gym to exercise his upper body.  He experiences constant urinary flow and uses Depends. No urinary pain but frequent urination. He is scheduled to see his family doctor for further evaluation.  Today patient denies chest pain, shortness of breath, lower extremity edema, fatigue, palpitations, melena, hematuria, hemoptysis, diaphoresis, weakness, presyncope, syncope, orthopnea, and PND.   Studies Reviewed:    EKG:   EKG Interpretation Date/Time:  Thursday Oct 17 2023 13:38:44 EDT Ventricular Rate:  83 PR Interval:  154 QRS Duration:  118 QT Interval:  384 QTC Calculation: 451 R Axis:   -19  Text Interpretation: Normal sinus rhythm Non-specific intra-ventricular conduction delay Minimal voltage criteria for LVH, may be normal variant ( R in aVL ) T wave abnormality, consider inferolateral ischemia When compared with ECG of 04-Jul-2023 14:34, T wave inversion now evident in Inferior leads T wave inversion now evident  in Lateral leads Confirmed by Calvin Escobar [id number]) on 10/17/2023 1:43:52 PM    Risk Assessment/Calculations:              Physical Exam:   VS:  BP 118/78   Pulse 83   Ht 6' (1.829 m)   Wt 246 lb (111.6 kg)   SpO2 91%   BMI 33.36 kg/m    Wt Readings from Last 3 Encounters:  10/17/23 246 lb (111.6 kg)  10/10/23 234 lb (106.1 kg)  09/16/23 232 lb 6.4 oz (105.4 kg)     Physical Exam Vitals reviewed.  Constitutional:      Appearance: Normal appearance.  HENT:     Head: Normocephalic.  Eyes:     Pupils: Pupils are equal, round, and reactive to light.  Cardiovascular:     Rate and Rhythm: Normal rate and regular rhythm.     Pulses: Normal pulses.     Heart sounds: Normal heart sounds.  Pulmonary:     Effort: Pulmonary effort is normal.     Breath sounds: Normal breath sounds.  Abdominal:     General: Abdomen is flat.     Palpations: Abdomen is soft.  Musculoskeletal:     Right lower leg: No edema.     Left lower leg: No edema.  Skin:    General: Skin is warm and dry.     Capillary Refill: Capillary refill takes less than 2 seconds.  Neurological:     General: No focal deficit present.     Mental Status: He is alert and oriented to person, place, and time.  Psychiatric:        Mood and Affect: Mood normal.        Behavior: Behavior normal.        Thought Content: Thought content normal.        Judgment: Judgment normal.     ASSESSMENT AND PLAN:    Assessment & Plan HFrecEF Presurgical evaluation Currently asymptomatic with LVEF of 50-55% as of December 2024. At its worst, LVEF 25-30% in 2021 with patient seeing AHF clinic, has since graduated. According to the Revised Cardiac Risk Index (RCRI), his Perioperative Risk of Major Cardiac Event is (%): 6.6 His Functional Capacity in METs is: 7.34 according to the Duke Activity Status Index (DASI). EKG today with new inferolateral TWI. Previous ischemic evaluation without CAD.  - Order Myoview stress test to assess EKG changes and ensure adequate cardiac  function before surgery. Will addend this note with findings. - Patient has been off GDMT due to hypotension since admission earlier this year. BP still borderline for resuming. Reassess the possibility of restarting heart failure medications in follow up. BP may reflect frequent urine output.   Hyperlipidemia Hyperlipidemia well-controlled with pravastatin .  Dilated Ascending aortaa 38mm on 2022 TTE. Repeat echo this past December didn't reveal dilation.  Morbid obesity Morbid obesity with weight around 230-240 lbs. Engaged in physical activity through [employer] program.  Obstructive sleep apnea Obstructive sleep apnea, not using BiPAP due to intolerance. Reports sleep disturbances, possibly related to urinary issues.  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 Imaging felt to show colovesical fistula, suspected as source of infections. Today reports experiencing constant urinary flow, requiring use of incontinence products. No urinary pain reported. As above, pending surgical correction of fistula.      Informed Consent   Shared Decision Making/Informed Consent The risks [chest pain, shortness of  breath, cardiac arrhythmias, dizziness, blood pressure fluctuations, myocardial infarction, stroke/transient ischemic attack, nausea, vomiting, allergic reaction, radiation exposure, metallic taste sensation and life-threatening complications (estimated to be 1 in 10,000)], benefits (risk stratification, diagnosing coronary artery disease, treatment guidance) and alternatives of a nuclear stress test were discussed in detail with Mr. Mcwain and he agrees to proceed.      Signed, Calvin Prince, PA-C

## 2023-10-17 NOTE — Patient Instructions (Signed)
 Medication Instructions:   Your physician recommends that you continue on your current medications as directed. Please refer to the Current Medication list given to you today.  *If you need a refill on your cardiac medications before your next appointment, please call your pharmacy*   Testing/Procedures:  Your physician has requested that you have a lexiscan myoview. For further information please visit [email or web address] Please follow instruction sheet, as given.    Follow-Up:  2-3 MONTH WITH AN EXTENDER IN THE OFFICE

## 2023-10-18 ENCOUNTER — Encounter: Payer: Self-pay | Admitting: Internal Medicine

## 2023-10-18 ENCOUNTER — Ambulatory Visit: Admitting: Internal Medicine

## 2023-10-18 VITALS — BP 116/77 | HR 84 | Temp 98.4°F | Ht 72.0 in | Wt 245.6 lb

## 2023-10-18 DIAGNOSIS — N321 Vesicointestinal fistula: Secondary | ICD-10-CM

## 2023-10-18 DIAGNOSIS — D638 Anemia in other chronic diseases classified elsewhere: Secondary | ICD-10-CM | POA: Diagnosis not present

## 2023-10-18 DIAGNOSIS — B3749 Other urogenital candidiasis: Secondary | ICD-10-CM

## 2023-10-18 DIAGNOSIS — N182 Chronic kidney disease, stage 2 (mild): Secondary | ICD-10-CM | POA: Diagnosis not present

## 2023-10-18 DIAGNOSIS — D649 Anemia, unspecified: Secondary | ICD-10-CM | POA: Diagnosis not present

## 2023-10-18 DIAGNOSIS — N390 Urinary tract infection, site not specified: Secondary | ICD-10-CM

## 2023-10-18 LAB — COMPREHENSIVE METABOLIC PANEL WITH GFR
ALT: 6 U/L (ref 0–53)
AST: 11 U/L (ref 0–37)
Albumin: 4 g/dL (ref 3.5–5.2)
Alkaline Phosphatase: 85 U/L (ref 39–117)
BUN: 29 mg/dL — ABNORMAL HIGH (ref 6–23)
CO2: 30 meq/L (ref 19–32)
Calcium: 9 mg/dL (ref 8.4–10.5)
Chloride: 105 meq/L (ref 96–112)
Creatinine, Ser: 1.58 mg/dL — ABNORMAL HIGH (ref 0.40–1.50)
GFR: 42.7 mL/min — ABNORMAL LOW (ref >60.00)
Glucose, Bld: 99 mg/dL (ref 70–99)
Potassium: 5.1 meq/L (ref 3.5–5.1)
Sodium: 141 meq/L (ref 135–145)
Total Bilirubin: 0.3 mg/dL (ref 0.2–1.2)
Total Protein: 7.4 g/dL (ref 6.0–8.3)

## 2023-10-18 LAB — CBC WITH DIFFERENTIAL/PLATELET
Basophils Absolute: 0 10*3/uL (ref 0.0–0.1)
Basophils Relative: 0.7 % (ref 0.0–3.0)
Eosinophils Absolute: 0.1 10*3/uL (ref 0.0–0.7)
Eosinophils Relative: 2.2 % (ref 0.0–5.0)
HCT: 29.8 % — ABNORMAL LOW (ref 39.0–52.0)
Hemoglobin: 9.7 g/dL — ABNORMAL LOW (ref 13.0–17.0)
Lymphocytes Relative: 23.5 % (ref 12.0–46.0)
Lymphs Abs: 1.4 10*3/uL (ref 0.7–4.0)
MCHC: 32.7 g/dL (ref 30.0–36.0)
MCV: 88 fl (ref 78.0–100.0)
Monocytes Absolute: 0.6 10*3/uL (ref 0.1–1.0)
Monocytes Relative: 10.3 % (ref 3.0–12.0)
Neutro Abs: 3.8 10*3/uL (ref 1.4–7.7)
Neutrophils Relative %: 63.3 % (ref 43.0–77.0)
Platelets: 177 10*3/uL (ref 150.0–400.0)
RBC: 3.38 Mil/uL — ABNORMAL LOW (ref 4.22–5.81)
RDW: 16.2 % — ABNORMAL HIGH (ref 11.5–15.5)
WBC: 5.9 10*3/uL (ref 4.0–10.5)

## 2023-10-18 NOTE — Progress Notes (Signed)
 ==============================  [facility]: [id number]   -- Medical Office Visit --  Patient: Calvin Escobar      Age: 73 y.o.       Sex:  male  Date:   10/18/2023 Today's Healthcare Provider: Anthon Kins, MD  ==============================   Chief Complaint: Anxiety and bipolar affective disorder  History of Present Illness 73 year old male with recurrent urinary tract infections and suspected bladder issues who presents for follow-up and further evaluation.   This appointment was requested by us  to follow up on candidal urinary tract infection (UTI) recently.  He has experienced recurrent urinary tract infections, with two or three severe episodes in the past year necessitating hospitalization. Previous urine cultures identified Candida, and he completed a two-week course of antifungal medication. A follow-up urine culture showed no presence of Candida.  He recently underwent a heart evaluation, including a stress test, to assess his fitness for upcoming bladder surgery. He has mild chronic kidney disease and is concerned about potential kidney damage from the fungal infection.  He describes irregular bowel movements and abnormal urine flow, often dribbling. Despite these symptoms, he feels 'really good' and has not experienced any negative effects.  He is not using his BiPAP machine at night, instead relying on medication to aid sleep, though he experiences frequent nocturia.  He recalls being on heart medication previously, which was discontinued due to hypotension during a past hospitalization. He is uncertain about his current medication regimen but believes there have been no recent changes. He is taking midodrine  to manage his blood pressure.  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   Background Reviewed: Problem List: has Bipolar affective disorder, depressed (HCC); Chronic bronchitis (HCC); Dementia (HCC); HLD (hyperlipidemia); DCM (dilated cardiomyopathy) (HCC); OSA treated with BiPAP; HFrEF (heart failure with reduced ejection fraction) (HCC); Ascending aorta dilatation (HCC); Alcoholism in remission [facility]); BPH (benign prostatic hyperplasia); PAC (premature atrial contraction); Primary insomnia; Generalized arthritis; Morbid obesity (HCC); Hypertension; Limp; Gynecomastia, male; Chronic back pain; Chronic kidney disease, stage 3a (HCC); Normocytic anemia; Tremor; Lack of appetite; Anemia of chronic disease; Obesity, Class II, BMI 35-39.9; LBBB (left bundle branch block); Colovesical fistula; Recurrent UTI; GERD (gastroesophageal reflux disease); Weight loss, abnormal; Hypocalcemia; Hypoalbuminemia; History of Clostridioides difficile colitis; Candidal UTI (urinary tract infection); Hydronephrosis of left kidney; and Prostatic mass on their problem list. Past Medical History:  has a past medical history of Acute cystitis (05/13/2023), Acute hyponatremia (05/13/2023), Acute metabolic encephalopathy (07/12/2023), Acute respiratory failure with hypoxia (HCC) (06/02/2023), AKI (acute kidney injury) (HCC) (05/13/2023), Allergy (seasonal), Ascending aorta dilatation (HCC), Cancer (HCC) (skin), DCM (dilated cardiomyopathy) (HCC), Dementia (HCC), Depression, Diarrhea (05/13/2023), E coli bacteremia (05/14/2023), High anion gap metabolic acidosis (07/13/2023), Hyperlipidemia, Lithium  toxicity (01/29/2016), OSA treated with BiPAP, PUD (peptic ulcer disease) (01/12/2015), Septic shock from UTI (06/02/2023), Severe sepsis (HCC) (06/03/2023), SKIN CANCER, HX OF (05/10/2007), Syncope (05/13/2023), Thrombocytopenia (HCC) (06/02/2023), Urinary catheter complication (HCC) (06/24/2023), Urinary dribbling (03/14/2022), Urinary incontinence (11/21/2022), Urinary tract infection without hematuria  (06/02/2023), and UTI (urinary tract infection) (07/01/2023). Past Surgical History:   has a past surgical history that includes Colonoscopy and RIGHT/LEFT HEART CATH AND CORONARY ANGIOGRAPHY (N/A, 07/03/2019). Social History:   reports that he has never smoked. He has never used smokeless tobacco. He reports that he does not currently use alcohol after  a past usage of about 14.0 standard drinks of alcohol per week. He reports that he does not use drugs. Family History:  family history includes Bone cancer in his sister; Cancer in his father; Hypertension in his mother. Allergies:  is allergic to albuterol .   Medication Reconciliation: Current Outpatient Medications on File Prior to Visit  Medication Sig   ARIPiprazole  (ABILIFY ) 2 MG tablet Take 1 tablet (2 mg total) by mouth in the morning.   busPIRone  (BUSPAR ) 10 MG tablet Take 1 tablet (10 mg total) by mouth 3 (three) times daily.   ferrous sulfate  325 (65 FE) MG tablet Take 1 tablet (325 mg total) by mouth 2 (two) times daily with a meal.   fluconazole  (DIFLUCAN ) 200 MG tablet Take 1 tablet (200 mg total) by mouth daily. Take 1 tablet on day one, then only half tablet daily until all tablets gone (15d total)   gabapentin  (NEURONTIN ) 100 MG capsule Take 1 capsule (100 mg total) by mouth 3 (three) times daily.   melatonin 3 MG TABS tablet Take 1 tablet (3 mg total) by mouth at bedtime as needed (insomnia).   midodrine  (PROAMATINE ) 5 MG tablet Take 1 tablet (5 mg total) by mouth 3 (three) times daily with meals.   mirtazapine  (REMERON ) 30 MG tablet Take 1 tablet (30 mg total) by mouth at bedtime.   pravastatin  (PRAVACHOL ) 20 MG tablet Take 1 tablet (20 mg total) by mouth at bedtime.   primidone  (MYSOLINE ) 50 MG tablet Take 1 tablet (50 mg total) by mouth 2 (two) times daily.   tamsulosin  (FLOMAX ) 0.4 MG CAPS capsule TAKE 1 CAPSULE BY MOUTH EVERY DAY   traZODone  (DESYREL ) 100 MG tablet Take 1 tablet (100 mg total) by mouth at bedtime.   No  current facility-administered medications on file prior to visit.  There are no discontinued medications.   Physical Exam:    10/18/2023    1:07 PM 10/17/2023    4:09 PM 10/17/2023    1:34 PM  Vitals with BMI  Height 6\' 0"   6\' 0"   Weight 245 lbs 10 oz  246 lbs  BMI 33.3  33.36  Systolic 116 118 829  Diastolic 77 78 96  Pulse 84  83  Vital signs reviewed.  Nursing notes reviewed. Weight trend reviewed. Physical Exam General Appearance:  No acute distress appreciable.   Fairly well groomed, chronicall tired appearing male.  Well proportioned with no truncal fat distribution. Pulmonary:  Normal work of breathing at rest, no respiratory distress apparent. SpO2: 98 %  Musculoskeletal: All extremities are intact.  Neurological:  Awake, alert, oriented, and engaged.  No obvious focal neurological deficits or cognitive impairments.  Sensorium seems unclouded.   Speech is clear and coherent with logical content. Psychiatric:  Appropriate mood, pleasant and cooperative demeanor, thoughtful and engaged during the exam      Results for orders placed or performed in visit on 10/18/23  Comp Met (CMET)  Result Value Ref Range   Sodium 141 135 - 145 mEq/L   Potassium 5.1 3.5 - 5.1 mEq/L   Chloride 105 96 - 112 mEq/L   CO2 30 19 - 32 mEq/L   Glucose, Bld 99 70 - 99 mg/dL   BUN 29 (H) 6 - 23 mg/dL   Creatinine, Ser 5.62 (H) 0.40 - 1.50 mg/dL   Total Bilirubin 0.3 0.2 - 1.2 mg/dL   Alkaline Phosphatase 85 39 - 117 U/L   AST 11 0 - 37 U/L   ALT 6 0 -  53 U/L   Total Protein 7.4 6.0 - 8.3 g/dL   Albumin  4.0 3.5 - 5.2 g/dL   GFR 57.84 (L) >69.62 mL/min   Calcium  9.0 8.4 - 10.5 mg/dL  CBC with Differential/Platelet  Result Value Ref Range   WBC 5.9 4.0 - 10.5 K/uL   RBC 3.38 (L) 4.22 - 5.81 Mil/uL   Hemoglobin 9.7 (L) 13.0 - 17.0 g/dL   HCT 95.2 (L) 84.1 - 32.4 %   MCV 88.0 78.0 - 100.0 fl   MCHC 32.7 30.0 - 36.0 g/dL   RDW 40.1 (H) 02.7 - 25.3 %   Platelets 177.0 150.0 - 400.0 K/uL    Neutrophils Relative % 63.3 43.0 - 77.0 %   Lymphocytes Relative 23.5 12.0 - 46.0 %   Monocytes Relative 10.3 3.0 - 12.0 %   Eosinophils Relative 2.2 0.0 - 5.0 %   Basophils Relative 0.7 0.0 - 3.0 %   Neutro Abs 3.8 1.4 - 7.7 K/uL   Lymphs Abs 1.4 0.7 - 4.0 K/uL   Monocytes Absolute 0.6 0.1 - 1.0 K/uL   Eosinophils Absolute 0.1 0.0 - 0.7 K/uL   Basophils Absolute 0.0 0.0 - 0.1 K/uL  Iron, TIBC and Ferritin Panel  Result Value Ref Range   Iron 32 (L) 50 - 180 mcg/dL   TIBC 664 (L) 250 - 425 mcg/dL (calc)   %SAT 13 (L) 20 - 48 % (calc)   Ferritin 420 (H) 24 - 380 ng/mL  Urinalysis, Complete  Result Value Ref Range   Color, Urine YELLOW YELLOW   APPearance CLEAR CLEAR   Specific Gravity, Urine 1.009 1.001 - 1.035   pH 7.0 5.0 - 8.0   Glucose, UA NEGATIVE NEGATIVE   Bilirubin Urine NEGATIVE NEGATIVE   Ketones, ur NEGATIVE NEGATIVE   Hgb urine dipstick TRACE (A) NEGATIVE   Protein, ur 1+ (A) NEGATIVE   Nitrite NEGATIVE NEGATIVE   Leukocytes,Ua 3+ (A) NEGATIVE   WBC, UA 40-60 (A) 0 - 5 /HPF   RBC / HPF NONE SEEN 0 - 2 /HPF   Squamous Epithelial / HPF NONE SEEN < OR = 5 /HPF   Bacteria, UA NONE SEEN NONE SEEN /HPF   Hyaline Cast 0-5 (A) NONE SEEN /LPF   Note     Office Visit on 10/18/2023  Component Date Value   Sodium 10/18/2023 141    Potassium 10/18/2023 5.1    Chloride 10/18/2023 105    CO2 10/18/2023 30    Glucose, Bld 10/18/2023 99    BUN 10/18/2023 29 (H)    Creatinine, Ser 10/18/2023 1.58 (H)    Total Bilirubin 10/18/2023 0.3    Alkaline Phosphatase 10/18/2023 85    AST 10/18/2023 11    ALT 10/18/2023 6    Total Protein 10/18/2023 7.4    Albumin  10/18/2023 4.0    GFR 10/18/2023 42.70 (L)    Calcium  10/18/2023 9.0    WBC 10/18/2023 5.9    RBC 10/18/2023 3.38 (L)    Hemoglobin 10/18/2023 9.7 (L)    HCT 10/18/2023 29.8 (L)    MCV 10/18/2023 88.0    MCHC 10/18/2023 32.7    RDW 10/18/2023 16.2 (H)    Platelets 10/18/2023 177.0    Neutrophils Relative %  10/18/2023 63.3    Lymphocytes Relative 10/18/2023 23.5    Monocytes Relative 10/18/2023 10.3    Eosinophils Relative 10/18/2023 2.2    Basophils Relative 10/18/2023 0.7    Neutro Abs 10/18/2023 3.8    Lymphs Abs 10/18/2023 1.4  Monocytes Absolute 10/18/2023 0.6    Eosinophils Absolute 10/18/2023 0.1    Basophils Absolute 10/18/2023 0.0    Iron 10/18/2023 32 (L)    TIBC 10/18/2023 238 (L)    %SAT 10/18/2023 13 (L)    Ferritin 10/18/2023 420 (H)    Color, Urine 10/18/2023 YELLOW    APPearance 10/18/2023 CLEAR    Specific Gravity, Urine 10/18/2023 1.009    pH 10/18/2023 7.0    Glucose, UA 10/18/2023 NEGATIVE    Bilirubin Urine 10/18/2023 NEGATIVE    Ketones, ur 10/18/2023 NEGATIVE    Hgb urine dipstick 10/18/2023 TRACE (A)    Protein, ur 10/18/2023 1+ (A)    Nitrite 10/18/2023 NEGATIVE    Leukocytes,Ua 10/18/2023 3+ (A)    WBC, UA 10/18/2023 40-60 (A)    RBC / HPF 10/18/2023 NONE SEEN    Squamous Epithelial / HPF 10/18/2023 NONE SEEN    Bacteria, UA 10/18/2023 NONE SEEN    Hyaline Cast 10/18/2023 0-5 (A)    Note 10/18/2023    Lab on 09/24/2023  Component Date Value   MICRO NUMBER: 09/24/2023 [id number]    SPECIMEN QUALITY: 09/24/2023 Adequate    Sample Source 09/24/2023 URINE    STATUS: 09/24/2023 FINAL    Result: 09/24/2023 No Growth    Sodium 09/24/2023 139    Potassium 09/24/2023 4.9    Chloride 09/24/2023 103    CO2 09/24/2023 27    Glucose, Bld 09/24/2023 97    BUN 09/24/2023 23    Creatinine, Ser 09/24/2023 1.27    GFR 09/24/2023 55.53 (L)    Calcium  09/24/2023 8.9    INR 09/24/2023 1.0    Prothrombin Time 09/24/2023 11.1    WBC 09/24/2023 6.6    RBC 09/24/2023 3.61 (L)    Hemoglobin 09/24/2023 10.1 (L)    HCT 09/24/2023 31.7 (L)    MCV 09/24/2023 87.7    MCHC 09/24/2023 31.8    RDW 09/24/2023 17.3 (H)    Platelets 09/24/2023 207.0    Neutrophils Relative % 09/24/2023 60.5    Lymphocytes Relative 09/24/2023 27.7    Monocytes Relative 09/24/2023 8.1     Eosinophils Relative 09/24/2023 2.4    Basophils Relative 09/24/2023 1.3    Neutro Abs 09/24/2023 4.0    Lymphs Abs 09/24/2023 1.8    Monocytes Absolute 09/24/2023 0.5    Eosinophils Absolute 09/24/2023 0.2    Basophils Absolute 09/24/2023 0.1    MICRO NUMBER: 09/24/2023 [id number]    SPECIMEN QUALITY: 09/24/2023 Suboptimal    Source 09/24/2023 RT BASEL ARM    STATUS: 09/24/2023 FINAL    Result: 09/24/2023 No growth after 5 days Inspection of blood culture bottles indicates that an inadequate volume of blood may have been collected for the detection of sepsis.    COMMENT: 09/24/2023 Aerobic and anaerobic bottle received.    MICRO NUMBER: 09/24/2023 [id number]    SPECIMEN QUALITY: 09/24/2023 Suboptimal    Source 09/24/2023 LF ARM    STATUS: 09/24/2023 FINAL    Result: 09/24/2023 No growth after 5 days Inspection of blood culture bottles indicates that an inadequate volume of blood may have been collected for the detection of sepsis.    COMMENT: 09/24/2023 Aerobic and anaerobic bottle received.   Office Visit on 09/16/2023  Component Date Value   Color, Urine 09/16/2023 YELLOW    APPearance 09/16/2023 CLOUDY (A)    Specific Gravity, Urine 09/16/2023 1.010    pH 09/16/2023 6.5    Glucose, UA 09/16/2023 NEGATIVE    Bilirubin Urine 09/16/2023 NEGATIVE  Ketones, ur 09/16/2023 NEGATIVE    Hgb urine dipstick 09/16/2023 TRACE (A)    Protein, ur 09/16/2023 NEGATIVE    Nitrites, Initial 09/16/2023 NEGATIVE    Leukocyte Esterase 09/16/2023 3+ (A)    WBC, UA 09/16/2023 > OR = 60 (A)    RBC / HPF 09/16/2023 3-10 (A)    Squamous Epithelial / HPF 09/16/2023 NONE SEEN    Bacteria, UA 09/16/2023 FEW (A)    Hyaline Cast 09/16/2023 0-5 (A)    Note 09/16/2023     Sodium 09/16/2023 139    Potassium 09/16/2023 4.8    Chloride 09/16/2023 106    CO2 09/16/2023 26    Glucose, Bld 09/16/2023 124 (H)    BUN 09/16/2023 21    Creatinine, Ser 09/16/2023 1.16    Total Bilirubin 09/16/2023 0.3    Alkaline  Phosphatase 09/16/2023 82    AST 09/16/2023 13    ALT 09/16/2023 6    Total Protein 09/16/2023 6.6    Albumin  09/16/2023 3.8    GFR 09/16/2023 61.91    Calcium  09/16/2023 8.6    Iron 09/16/2023 66    Transferrin 09/16/2023 146.0 (L)    Saturation Ratios 09/16/2023 32.3    Ferritin 09/16/2023 413.1 (H)    TIBC 09/16/2023 204.4 (L)    MICRO NUMBER: 09/16/2023 [id number]    SPECIMEN QUALITY: 09/16/2023 Adequate    Sample Source 09/16/2023 URINE    STATUS: 09/16/2023 FINAL    ISOLATE 1: 09/16/2023 Candida albicans (A)    REFLEXIVE URINE CULTURE 09/16/2023    Office Visit on 08/14/2023  Component Date Value   TSH 08/14/2023 1.760    Cholesterol 08/14/2023 118    HDL 08/14/2023 39 (L)    Triglycerides 08/14/2023 168 (H)    LDL Cholesterol (Calc) 08/14/2023 54    Total CHOL/HDL Ratio 08/14/2023 3.0    Non-HDL Cholesterol (Cal* 08/14/2023 79    Sodium 08/14/2023 141    Potassium 08/14/2023 4.8    Chloride 08/14/2023 104    CO2 08/14/2023 29    Glucose, Bld 08/14/2023 84    BUN 08/14/2023 26 (H)    Creatinine, Ser 08/14/2023 1.29    Total Bilirubin 08/14/2023 0.2    Alkaline Phosphatase 08/14/2023 86    AST 08/14/2023 15    ALT 08/14/2023 9    Total Protein 08/14/2023 6.8    Albumin  08/14/2023 3.8    GFR 08/14/2023 54.54 (L)    Calcium  08/14/2023 8.9    Hgb A1c MFr Bld 08/14/2023 5.3    PSA 08/14/2023 0.61    Color, Urine 08/14/2023 YELLOW    APPearance 08/14/2023 CLEAR    Specific Gravity, Urine 08/14/2023 1.012    pH 08/14/2023 7.0    Glucose, UA 08/14/2023 NEGATIVE    Bilirubin Urine 08/14/2023 NEGATIVE    Ketones, ur 08/14/2023 NEGATIVE    Hgb urine dipstick 08/14/2023 2+ (A)    Protein, ur 08/14/2023 NEGATIVE    Nitrites, Initial 08/14/2023 NEGATIVE    Leukocyte Esterase 08/14/2023 TRACE (A)    WBC, UA 08/14/2023 10-20 (A)    RBC / HPF 08/14/2023 3-10 (A)    Squamous Epithelial / HPF 08/14/2023 NONE SEEN    Bacteria, UA 08/14/2023 FEW (A)    Calcium  Oxalate  Crystal 08/14/2023 MODERATE (A)    Hyaline Cast 08/14/2023 NONE SEEN    Note 08/14/2023     PTH 08/14/2023 65    Uric Acid, Serum 08/14/2023 6.5    Microalb, Ur 08/14/2023 3.7 (H)    Creatinine,U 08/14/2023 51.7  Microalb Creat Ratio 08/14/2023 72.2 (H)    Prealbumin 08/14/2023 26    Iron 08/14/2023 76    TIBC 08/14/2023 228 (L)    %SAT 08/14/2023 33    Ferritin 08/14/2023 684 (H)    CRP 08/14/2023 <1.0    Sed Rate 08/14/2023 50 (H)    Creatinine, Urine 08/14/2023 54    Protein/Creat Ratio 08/14/2023 241 (H)    Protein/Creatinine Ratio 08/14/2023 0.241 (H)    Total Protein, Urine 08/14/2023 13    VITD 08/14/2023 36.65    MICRO NUMBER: 08/14/2023 [id number]    SPECIMEN QUALITY: 08/14/2023 Adequate    Sample Source 08/14/2023 URINE    STATUS: 08/14/2023 FINAL    Result: 08/14/2023 No Growth    REFLEXIVE URINE CULTURE 08/14/2023    No results displayed because visit has over 200 results.    No results displayed because visit has over 200 results.    Office Visit on 06/24/2023  Component Date Value   WBC 06/24/2023 8.8    RBC 06/24/2023 3.52 (L)    Hemoglobin 06/24/2023 9.6 (L)    HCT 06/24/2023 30.2 (L)    MCV 06/24/2023 85.8    MCHC 06/24/2023 31.7    RDW 06/24/2023 17.7 (H)    Platelets 06/24/2023 256.0    Neutrophils Relative % 06/24/2023 52.9    Lymphocytes Relative 06/24/2023 29.9    Monocytes Relative 06/24/2023 9.2    Eosinophils Relative 06/24/2023 5.4 (H)    Basophils Relative 06/24/2023 2.6    Neutro Abs 06/24/2023 4.7    Lymphs Abs 06/24/2023 2.6    Monocytes Absolute 06/24/2023 0.8    Eosinophils Absolute 06/24/2023 0.5    Basophils Absolute 06/24/2023 0.2 (H)    Sodium 06/24/2023 137    Potassium 06/24/2023 3.9    Chloride 06/24/2023 102    CO2 06/24/2023 28    Glucose, Bld 06/24/2023 128 (H)    BUN 06/24/2023 14    Creatinine, Ser 06/24/2023 1.23    Total Bilirubin 06/24/2023 0.3    Alkaline Phosphatase 06/24/2023 61    AST 06/24/2023 19    ALT  06/24/2023 9    Total Protein 06/24/2023 7.9    Albumin  06/24/2023 3.8    GFR 06/24/2023 57.80 (L)    Calcium  06/24/2023 9.0   No results displayed because visit has over 200 results.    No results displayed because visit has over 200 results.    Office Visit on 05/08/2023  Component Date Value   WBC 05/08/2023 6.5    RBC 05/08/2023 3.61 (L)    Platelets 05/08/2023 249.0    Hemoglobin 05/08/2023 10.1 (L)    HCT 05/08/2023 30.9 (L)    MCV 05/08/2023 85.6    MCHC 05/08/2023 32.8    RDW 05/08/2023 15.1    Ferritin 05/08/2023 >1500.0 (H)    Folate 05/08/2023 >24.2    Path Review 05/08/2023     Retic Ct Pct 05/08/2023 2.0    ABS Retic 05/08/2023 73,000    Sed Rate 05/08/2023 60 (H)    Vitamin B-12 05/08/2023 306   There may be more visits with results that are not included.  No image results found. 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 Evaluate for fungal bezoar, renal involvement, and structural abnormalities. CT 05/2023 showed possible colovesical fistula. Stage 3 CKD (GFR 62). History of urosepsis. Assess for perinephric fluid collections EXAM: RENAL / URINARY TRACT ULTRASOUND COMPLETE COMPARISON:  CT 06/02/2023 FINDINGS:  Right Kidney: Renal measurements: 9.5 x 4.8 x 4.3 cm = volume: 101 mL. 1.7 x 1.5 cm cortical cyst superior pole. No hydronephrosis. Normal parenchymal echotexture. Left Kidney: Renal measurements: 11.3 x 5.8 x 5.2 cm = volume: 179 mL. Echogenicity within normal limits. Mild hydronephrosis. Bladder: Physiologically distended. Mass possibly prostate protrudes into the base the bladder. Other: None. IMPRESSION: 1. Mild left hydronephrosis. 2. No perinephric fluid collections. 3. 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 Electronically Signed   By: Nicoletta Barrier M.D.   On: 09/28/2023 11:38        10/10/2023   11:58 AM 09/16/2023    2:10 PM 03/07/2023     1:35 PM 01/15/2023   12:02 PM  PHQ 2/9 Scores  PHQ - 2 Score 0 2 1 3   PHQ- 9 Score 0 7 4 12    Results LABS Urine culture: No Candida (09/30/2023) Urine culture: Candida 10,000-50,000 CFU/mL (08/30/2023)    Assessment & Plan Recurrent UTI Multiple severe urinary tract infections are likely related to the colovesical fistula and Candida colonization. He has been hospitalized multiple times in the past year due to these infections. Surgical repair of the fistula is anticipated to prevent future infections. 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 Previous urinalysis showed Candida growth, but recent tests indicate possible clearance after antifungal treatment. He reports feeling well, but the presence of Candida in the urine is concerning due to the risk of severe infections and potential complications. Recent urinalysis showed no Candida, suggesting possible successful treatment. Order a full urine culture and microscopic examination for fungus, blood cultures for fungus, and fungus antibody panels in blood. Refer to an infectious disease specialist on June 2nd or 3rd. 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 He has experienced severe infections requiring hospitalization. Surgery is planned to repair the fistula, pending cardiac clearance. The procedure is expected to be minor, possibly involving a simple stitch, with a recovery time of up to four weeks. Proceed with surgical consultation and plan for repair after cardiac clearance. Ensure cardiac clearance with a stress test and other evaluations as needed. Chronic kidney disease, stage 2 (mild) Mild chronic kidney disease raises concern for potential fungal involvement affecting kidney function. Regular monitoring is necessary to ensure no progression  or complications from the Candida infection. Recheck kidney function today to ensure no kidney damage from the fungal infection. Lab Results  Component Value Date   GFR 42.70 (L) 10/18/2023   GFR 55.53 (L) 09/24/2023   GFR 61.91 09/16/2023   GFR 54.54 (L) 08/14/2023   GFR 57.80 (L) 06/24/2023   EGFR 81 04/14/2021   EGFR 63 09/09/2020        Orders Placed During this Encounter:   Orders Placed This Encounter  Procedures   Fungus culture, blood   Urine Culture   Urinalysis w microscopic + reflex cultur   Fungal antibodies panel, id-blood   Comp Met (CMET)   CBC with Differential/Platelet   Iron, TIBC and Ferritin Panel   Urinalysis, Complete         This document was synthesized by artificial intelligence ([employer]bridge) using HIPAA-compliant recording of the clinical interaction;   We discussed the use of AI scribe software for clinical note transcription with the patient, who gave verbal consent to proceed. additional Info: This encounter employed state-of-the-art, real-time, collaborative documentation. The patient actively  reviewed and assisted in updating their electronic medical record on a shared screen, ensuring transparency and facilitating joint problem-solving for the problem list, overview, and plan. This approach promotes accurate, informed care. The treatment plan was discussed and reviewed in detail, including medication safety, potential side effects, and all patient questions. We confirmed understanding and comfort with the plan. Follow-up instructions were established, including contacting the office for any concerns, returning if symptoms worsen, persist, or new symptoms develop, and precautions for potential emergency department visits.

## 2023-10-19 ENCOUNTER — Ambulatory Visit: Payer: Self-pay | Admitting: Internal Medicine

## 2023-10-19 ENCOUNTER — Encounter: Payer: Self-pay | Admitting: Internal Medicine

## 2023-10-19 DIAGNOSIS — N321 Vesicointestinal fistula: Secondary | ICD-10-CM

## 2023-10-19 DIAGNOSIS — B3749 Other urogenital candidiasis: Secondary | ICD-10-CM

## 2023-10-19 DIAGNOSIS — N179 Acute kidney failure, unspecified: Secondary | ICD-10-CM

## 2023-10-19 LAB — URINALYSIS, COMPLETE
Bacteria, UA: NONE SEEN /HPF
Bilirubin Urine: NEGATIVE
Glucose, UA: NEGATIVE
Ketones, ur: NEGATIVE
Nitrite: NEGATIVE
RBC / HPF: NONE SEEN /HPF (ref 0–2)
Specific Gravity, Urine: 1.009 (ref 1.001–1.035)
Squamous Epithelial / HPF: NONE SEEN /HPF (ref <=5)
pH: 7 (ref 5.0–8.0)

## 2023-10-19 LAB — URINE CULTURE
MICRO NUMBER:: 16466065
SPECIMEN QUALITY:: ADEQUATE

## 2023-10-19 LAB — IRON,TIBC AND FERRITIN PANEL
%SAT: 13 % — ABNORMAL LOW (ref 20–48)
Ferritin: 420 ng/mL — ABNORMAL HIGH (ref 24–380)
Iron: 32 ug/dL — ABNORMAL LOW (ref 50–180)
TIBC: 238 ug/dL — ABNORMAL LOW (ref 250–425)

## 2023-10-19 NOTE — Assessment & Plan Note (Signed)
 Multiple severe urinary tract infections are likely related to the colovesical fistula and Candida colonization. He has been hospitalized multiple times in the past year due to these infections. Surgical repair of the fistula is anticipated to prevent future infections.

## 2023-10-19 NOTE — Patient Instructions (Signed)
 VISIT SUMMARY:  You came in today for a follow-up and further evaluation of your recurrent urinary tract infections and suspected bladder issues. We discussed your recent health history, including your heart evaluation, mild chronic kidney disease, and your concerns about potential kidney damage from a previous fungal infection. You also mentioned irregular bowel movements, abnormal urine flow, and your current medication regimen.  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 Surgery is planned to repair the fistula after you receive cardiac clearance. The procedure is expected to be minor, with a recovery time of up to four weeks. Please proceed with your surgical consultation and plan for the repair after your heart evaluation is complete.  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 The planned surgical repair of the fistula should help prevent future infections.  -CANDIDA URINARY TRACT INFECTION: A Candida urinary tract infection is caused by a fungal infection in the urinary tract. Previous tests showed Candida growth, but recent tests indicate possible clearance after antifungal treatment. We will order a full urine culture and microscopic examination for fungus, blood cultures for fungus, and fungus antibody panels in blood. You are also scheduled to see an infectious disease specialist in early June.  -MILD CHRONIC KIDNEY DISEASE: Mild chronic kidney disease means your kidneys are not functioning at full capacity. Regular monitoring is necessary to ensure no progression or complications from the Candida infection. We will recheck your kidney function today to ensure there is no kidney damage from the fungal infection.  INSTRUCTIONS:  Please  proceed with your surgical consultation and plan for the repair of the colovesical fistula after your heart evaluation is complete. You are also scheduled to see an infectious disease specialist in early June. We will recheck your kidney function today to ensure there is no kidney damage from the fungal infection.

## 2023-10-19 NOTE — Assessment & Plan Note (Signed)
 Persistent Candida infection in the urinary tract is likely due to the colovesical fistula. Previous urinalysis showed Candida growth, but recent tests indicate possible clearance after antifungal treatment. He reports feeling well, but the presence of Candida in the urine is concerning due to the risk of severe infections and potential complications. Recent urinalysis showed no Candida, suggesting possible successful treatment. Order a full urine culture and microscopic examination for fungus, blood cultures for fungus, and fungus antibody panels in blood. Refer to an infectious disease specialist on June 2nd or 3rd.

## 2023-10-19 NOTE — Assessment & Plan Note (Signed)
 A suspected colovesical fistula is causing recurrent urinary tract infections and possible Candida colonization in the bladder. He has experienced severe infections requiring hospitalization. Surgery is planned to repair the fistula, pending cardiac clearance. The procedure is expected to be minor, possibly involving a simple stitch, with a recovery time of up to four weeks. Proceed with surgical consultation and plan for repair after cardiac clearance. Ensure cardiac clearance with a stress test and other evaluations as needed.

## 2023-10-19 NOTE — Progress Notes (Signed)
 There is a persistent urinary tract infection (UTI) despite him feeling fine, we confirmed There wasn't any bacteria seen though so I think it might still be fungal. There is some slight kidney injury (gfr 55 to 42), he needs to increase fluid intake, option to get checked weekly in office and/or refer to nephrology.

## 2023-10-20 NOTE — Progress Notes (Signed)
 Reviewed renal and urinary test results from 10/20/2023. Key findings: Creatinine increased to 1.58 mg/dL from 4.09 mg/dL; GFR decreased to 42 mL/min from 55 mL/min; Urinalysis shows significant pyuria (WBC 40-60/HPF) with negative bacterial culture. 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 Next steps: Urgent nephrology and urology consultations requested; weekly kidney function monitoring ordered; specific fungal cultures pending. See Patient Message for details.

## 2023-10-21 ENCOUNTER — Telehealth: Payer: Self-pay

## 2023-10-21 ENCOUNTER — Other Ambulatory Visit: Payer: Self-pay

## 2023-10-21 DIAGNOSIS — N179 Acute kidney failure, unspecified: Secondary | ICD-10-CM

## 2023-10-21 NOTE — Telephone Encounter (Signed)
 Spoke with pt about lab results understood. He will come in once week for blood work. Stated he will wait on referral call to the kidney dr. Copied from CRM [id number]. Topic: Clinical - Lab/Test Results >> Oct 21, 2023 10:58 AM Calvin Escobar wrote: Reason for CRM: Patient is requesting Nana Avers to call him to discuss his recent lab work. Patients CB number is [phone]

## 2023-10-22 ENCOUNTER — Other Ambulatory Visit: Payer: Self-pay

## 2023-10-22 DIAGNOSIS — N182 Chronic kidney disease, stage 2 (mild): Secondary | ICD-10-CM

## 2023-10-22 DIAGNOSIS — B3749 Other urogenital candidiasis: Secondary | ICD-10-CM

## 2023-10-22 DIAGNOSIS — N179 Acute kidney failure, unspecified: Secondary | ICD-10-CM

## 2023-10-24 ENCOUNTER — Telehealth: Payer: Self-pay

## 2023-10-24 NOTE — Telephone Encounter (Signed)
-----   Message from Anthon Kins sent at 10/21/2023  8:46 PM EDT ----- Encourage patient to buy a 25-30 $ machine with a large arm cuff and bring blood pressure to visits.  If not doable. Sent smart text on how to measure blood pressure.  Offer to just have weekly appointment if that is overwhelming for him. ----- Message ----- From: Marrianne Six, CMA Sent: 10/21/2023  11:24 AM EDT To: Anthon Kins, MD  Spoke with pt about labs he will come in every Friday for blood work I put in order for Bmp if anything else needed please let me know he also would like for me to check his bp each week he does not have a way at home to check it.

## 2023-10-24 NOTE — Telephone Encounter (Signed)
 Pt will come in once week when getting labs done to have Bp check per pt request.

## 2023-10-25 ENCOUNTER — Other Ambulatory Visit

## 2023-10-25 DIAGNOSIS — N179 Acute kidney failure, unspecified: Secondary | ICD-10-CM | POA: Diagnosis not present

## 2023-10-25 DIAGNOSIS — N182 Chronic kidney disease, stage 2 (mild): Secondary | ICD-10-CM | POA: Diagnosis not present

## 2023-10-25 DIAGNOSIS — B3749 Other urogenital candidiasis: Secondary | ICD-10-CM

## 2023-10-25 NOTE — Addendum Note (Signed)
 Addended by: Avelina Leitz on: 10/25/2023 03:55 PM   Modules accepted: Orders

## 2023-10-26 ENCOUNTER — Ambulatory Visit: Payer: Self-pay | Admitting: Internal Medicine

## 2023-10-26 LAB — BASIC METABOLIC PANEL WITH GFR
BUN/Creatinine Ratio: 14 (calc) (ref 6–22)
BUN: 22 mg/dL (ref 7–25)
CO2: 27 mmol/L (ref 20–32)
Calcium: 8.7 mg/dL (ref 8.6–10.3)
Chloride: 107 mmol/L (ref 98–110)
Creat: 1.52 mg/dL — ABNORMAL HIGH (ref 0.70–1.28)
Glucose, Bld: 104 mg/dL — ABNORMAL HIGH (ref 65–99)
Potassium: 4.8 mmol/L (ref 3.5–5.3)
Sodium: 141 mmol/L (ref 135–146)
eGFR: 48 mL/min/{1.73_m2} — ABNORMAL LOW (ref >=60)

## 2023-10-26 NOTE — Progress Notes (Signed)
 We need to continue weekly BMP because the creatinine is still a little higher, concerning for ongoing fungal urinary tract infection (UTI).  Also need to keep drinking plenty of fluid and follow up surgery and urology.   Set up lab appointments and notify patient.

## 2023-10-30 ENCOUNTER — Telehealth (HOSPITAL_COMMUNITY): Payer: Self-pay | Admitting: *Deleted

## 2023-10-30 ENCOUNTER — Other Ambulatory Visit: Payer: Self-pay | Admitting: Cardiology

## 2023-10-30 DIAGNOSIS — Z961 Presence of intraocular lens: Secondary | ICD-10-CM | POA: Diagnosis not present

## 2023-10-30 DIAGNOSIS — H3550 Unspecified hereditary retinal dystrophy: Secondary | ICD-10-CM | POA: Diagnosis not present

## 2023-10-30 DIAGNOSIS — R9431 Abnormal electrocardiogram [ECG] [EKG]: Secondary | ICD-10-CM

## 2023-10-30 LAB — FUNGUS CULTURE, BLOOD

## 2023-10-30 NOTE — Telephone Encounter (Signed)
 Patient given detailed instructions per Myocardial Perfusion Study Information Sheet for the test on 11/04/2023 at 10:15. Patient notified to arrive 15 minutes early and that it is imperative to arrive on time for appointment to keep from having the test rescheduled.  If you need to cancel or reschedule your appointment, please call the office within 24 hours of your appointment. . Patient verbalized understanding.Anne Barrios

## 2023-11-01 ENCOUNTER — Encounter: Payer: Self-pay | Admitting: Internal Medicine

## 2023-11-01 ENCOUNTER — Other Ambulatory Visit

## 2023-11-01 ENCOUNTER — Ambulatory Visit (INDEPENDENT_AMBULATORY_CARE_PROVIDER_SITE_OTHER): Admitting: Internal Medicine

## 2023-11-01 VITALS — BP 122/70

## 2023-11-01 DIAGNOSIS — N179 Acute kidney failure, unspecified: Secondary | ICD-10-CM

## 2023-11-01 DIAGNOSIS — Z5320 Procedure and treatment not carried out because of patient's decision for unspecified reasons: Secondary | ICD-10-CM

## 2023-11-01 LAB — BASIC METABOLIC PANEL WITH GFR
BUN: 26 mg/dL — ABNORMAL HIGH (ref 6–23)
CO2: 26 meq/L (ref 19–32)
Calcium: 8.7 mg/dL (ref 8.4–10.5)
Chloride: 106 meq/L (ref 96–112)
Creatinine, Ser: 1.6 mg/dL — ABNORMAL HIGH (ref 0.40–1.50)
GFR: 42.05 mL/min — ABNORMAL LOW (ref >60.00)
Glucose, Bld: 119 mg/dL — ABNORMAL HIGH (ref 70–99)
Potassium: 5 meq/L (ref 3.5–5.1)
Sodium: 141 meq/L (ref 135–145)

## 2023-11-02 ENCOUNTER — Ambulatory Visit: Payer: Self-pay | Admitting: Internal Medicine

## 2023-11-02 NOTE — Progress Notes (Signed)
 Kidneys are not better but not worse from last 2 weeks but they were better 1 month ago I recommend continuing to monitor closely every 2 to 4 weeks and anytime symptoms worsen and if they are having that much symptoms but I do believe there is a problem going on with the bladder and kidneys that has really been ever since several months ago and we need to work it out with urology and surgery we will put him

## 2023-11-03 NOTE — Progress Notes (Signed)
 Patient was not roomed. He was at office and got requested labwork. Seems maybe he had a blood pressure check and left. Left without being seen.

## 2023-11-04 ENCOUNTER — Telehealth (HOSPITAL_COMMUNITY): Payer: Self-pay

## 2023-11-04 ENCOUNTER — Ambulatory Visit (HOSPITAL_COMMUNITY): Admission: RE | Admit: 2023-11-04 | Source: Ambulatory Visit | Attending: Cardiology

## 2023-11-04 NOTE — Telephone Encounter (Signed)
 LVM per DPR on 11/01/23 @ 13:00 with pts wife to call back to reschedule the pts appointment due to insurance not being approved.

## 2023-11-05 ENCOUNTER — Encounter: Payer: Self-pay | Admitting: Infectious Diseases

## 2023-11-05 ENCOUNTER — Ambulatory Visit: Admitting: Infectious Diseases

## 2023-11-05 ENCOUNTER — Other Ambulatory Visit: Payer: Self-pay

## 2023-11-05 VITALS — BP 135/79 | HR 76 | Temp 97.7°F | Ht 72.0 in | Wt 246.0 lb

## 2023-11-05 DIAGNOSIS — B3749 Other urogenital candidiasis: Secondary | ICD-10-CM | POA: Insufficient documentation

## 2023-11-05 DIAGNOSIS — A0472 Enterocolitis due to Clostridium difficile, not specified as recurrent: Secondary | ICD-10-CM

## 2023-11-05 DIAGNOSIS — N321 Vesicointestinal fistula: Secondary | ICD-10-CM

## 2023-11-05 DIAGNOSIS — F413 Other mixed anxiety disorders: Secondary | ICD-10-CM | POA: Diagnosis not present

## 2023-11-05 DIAGNOSIS — F332 Major depressive disorder, recurrent severe without psychotic features: Secondary | ICD-10-CM | POA: Diagnosis not present

## 2023-11-05 DIAGNOSIS — Z79899 Other long term (current) drug therapy: Secondary | ICD-10-CM | POA: Diagnosis not present

## 2023-11-05 DIAGNOSIS — F1011 Alcohol abuse, in remission: Secondary | ICD-10-CM | POA: Diagnosis not present

## 2023-11-05 HISTORY — DX: Other urogenital candidiasis: B37.49

## 2023-11-05 NOTE — Progress Notes (Unsigned)
 Patient Active Problem List   Diagnosis Date Noted   Hydronephrosis of left kidney 09/28/2023   Prostatic mass 09/28/2023   Candidal UTI (urinary tract infection) 09/22/2023   History of Clostridioides difficile colitis 08/15/2023   Weight loss, abnormal 08/14/2023   Hypocalcemia 08/14/2023   Hypoalbuminemia 08/14/2023   GERD (gastroesophageal reflux disease) 07/13/2023   Recurrent UTI 07/01/2023   LBBB (left bundle branch block) 06/02/2023   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   AKI (acute kidney injury) (HCC) 05/13/2023   Obesity, Class II, BMI 35-39.9 05/13/2023   Anemia of chronic disease 05/12/2023   Tremor 05/08/2023   Lack of appetite 05/08/2023   Chronic kidney disease, stage 3a (HCC) 03/07/2023   Normocytic anemia 03/07/2023   Limp 12/19/2022   Gynecomastia, male 12/19/2022   Chronic back pain 12/19/2022   Hypertension 11/21/2022   Generalized arthritis 07/18/2022   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   HFrEF (heart failure with reduced ejection fraction) (HCC) 07/30/2019   PAC (premature atrial contraction) 07/17/2019   DCM (dilated cardiomyopathy) (HCC)    OSA treated with BiPAP    Primary insomnia 08/05/2017   Dementia (HCC)    HLD (hyperlipidemia)    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   BUSPIRONE  (BUSPAR ) 10 MG TABLET    Take 1 tablet (10 mg total) by mouth 3 (three) times daily.   FERROUS SULFATE  325 (65 FE) MG TABLET    Take 1 tablet (325 mg total) by mouth 2 (two) times daily with a meal.   FLUCONAZOLE  (DIFLUCAN ) 200 MG TABLET    Take 1 tablet (200 mg total) by mouth daily. Take 1  tablet on day one, then only half tablet daily until all tablets gone (15d total)   GABAPENTIN  (NEURONTIN ) 100 MG CAPSULE    Take 1 capsule (100 mg total) by mouth 3 (three) times daily.   MELATONIN 3 MG TABS TABLET    Take 1 tablet (3 mg total) by mouth at bedtime as needed (insomnia).   MIDODRINE  (PROAMATINE ) 5 MG TABLET    Take 1 tablet (5 mg total) by mouth 3 (three) times daily with meals.   MIRTAZAPINE  (REMERON ) 30 MG TABLET    Take 1 tablet (30 mg total) by mouth at bedtime.   PRAVASTATIN  (PRAVACHOL ) 20 MG TABLET    Take 1 tablet (20 mg total) by mouth at bedtime.   PRIMIDONE  (MYSOLINE ) 50 MG TABLET    Take 1 tablet (50 mg total) by mouth 2 (two) times daily.   TAMSULOSIN  (FLOMAX ) 0.4 MG CAPS CAPSULE    TAKE 1 CAPSULE BY MOUTH EVERY DAY   TRAZODONE  (DESYREL ) 100 MG TABLET    Take 1 tablet (100 mg total) by mouth at bedtime.  Modified Medications   No medications on file  Discontinued Medications   No medications on file    Subjective Discussed the use of AI scribe software for clinical note transcription with the patient, who gave verbal consent to proceed.   73 Y O with prior h/o E coli bacteremia, HLD, CKD, CHFpEF,  Obesity/OSA, PUD, Skin ca,  Dementia, Depression, BPH, Urinary incontinence, C diff  diarrhea, h/o alcoholism in remission who is referred from PCP for possible candida UTI.   He denies any GU symptoms such as dysuria, frequency, abdominal/flank pain, nausea, vomiting, or fever previolusly/currently. He however reports urinary incontinence and need to use depends due to fluid intake.  He reports he was told to have  a colovesical fistula which has not been repaired yet.\ Reports he has an appt with PCP tomorrow to discuss about fistula repair.   His only symptom is decreased energy levels, attributing this to aging, and disrupted sleep due to frequent urination and changing depends. He has been abstinent from alcohol for about fifteen years.  Lives with wife and son.    Review of Systems: all systems reviewed with pertinent positives and negatives as listed above   Past Medical History:  Diagnosis Date   Acute cystitis 05/13/2023   Acute hyponatremia 05/13/2023   Acute metabolic encephalopathy 07/12/2023   Acute respiratory failure with hypoxia (HCC) 06/02/2023   AKI (acute kidney injury) (HCC) 05/13/2023   Allergy seasonal   Ascending aorta dilatation (HCC)    38 mm by 2D echo 04/2021   Cancer (HCC) skin   DCM (dilated cardiomyopathy) (HCC)    nonischemic with normal coronary arteries at cath 06/2019.  EF 35 to 40% on echo 04/2021   Dementia (HCC)    Depression    Diarrhea 05/13/2023   E coli bacteremia 05/14/2023   High anion gap metabolic acidosis 07/13/2023   Hyperlipidemia    Lithium  toxicity 01/29/2016   OSA treated with BiPAP    PUD (peptic ulcer disease) 01/12/2015   Formatting of this note might be different from the original.  Endoscopy esophageal stricture 2016     Septic shock from UTI 06/02/2023   Severe sepsis (HCC) 06/03/2023   SKIN CANCER, HX OF 05/10/2007   Facial left check and forehead and r shoulder  F/w derm   Syncope 05/13/2023   Thrombocytopenia (HCC) 06/02/2023   Lab Results      Component    Value    Date           PLT    301    07/19/2023           PLT    324    07/18/2023           PLT    268    07/16/2023           PLT    207    07/14/2023           PLT    228    07/13/2023             Lab Results      Component    Value    Date           ESRSEDRATE    60 (H)    05/08/2023     No results found for: "CRP"          Lab Results      Component    Value       Urinary catheter complication (HCC) 06/24/2023   Urinary dribbling 03/14/2022   Urinary incontinence 11/21/2022      Urinary Incontinence: They are wearing disposable diapers daily due to urinary incontinence, primarily nocturnal, and are currently on Tamsulosin  (Flomax ). We will refer them to Urology for further evaluation and potential treatment options and  continue Tamsulosin  until they are seen by Urology.     Urinary tract  infection without hematuria 06/02/2023   UTI (urinary tract infection) 07/01/2023   Past Surgical History:  Procedure Laterality Date   COLONOSCOPY     RIGHT/LEFT HEART CATH AND CORONARY ANGIOGRAPHY N/A 07/03/2019   Procedure: RIGHT/LEFT HEART CATH AND CORONARY ANGIOGRAPHY;  Surgeon: Mardell Shade, MD;  Location: [facility];  Service: Cardiovascular;  Laterality: N/A;    Social History   Tobacco Use   Smoking status: Never   Smokeless tobacco: Never  Vaping Use   Vaping status: Never Used  Substance Use Topics   Alcohol use: Not Currently    Alcohol/week: 14.0 standard drinks of alcohol    Types: 14 Cans of beer per week   Drug use: No    Family History  Problem Relation Age of Onset   Hypertension Mother    Cancer Father    Bone cancer Sister     Allergies  Allergen Reactions   Albuterol  Palpitations    Supraventricular Tachycardia     Health Maintenance  Topic Date Due   COVID-19 Vaccine (9 - Pfizer risk 2024-25 season) 08/17/2023   INFLUENZA VACCINE  01/03/2024   Medicare Annual Wellness (AWV)  10/09/2024   Colonoscopy  04/24/2031   DTaP/Tdap/Td (3 - Td or Tdap) 12/04/2032   Pneumonia Vaccine 42+ Years old  Completed   Hepatitis C Screening  Completed   Zoster Vaccines- Shingrix  Completed   HPV VACCINES  Aged Out   Meningococcal B Vaccine  Aged Out    Objective: BP 135/79   Pulse 76   Temp 97.7 F (36.5 C) (Temporal)   Ht 6' (1.829 m)   Wt 246 lb (111.6 kg)   SpO2 96%   BMI 33.36 kg/m    Physical Exam Constitutional:      Appearance: Normal appearance.  HENT:     Head: Normocephalic and atraumatic.      Mouth: Mucous membranes are moist.  Eyes:    Conjunctiva/sclera: Conjunctivae normal.     Pupils: Pupils are equal, round, and b/l symmetrical    Cardiovascular:     Rate and Rhythm: Normal rate and regular rhythm.     Heart sounds: s1s2  Pulmonary:      Effort: Pulmonary effort is normal.     Breath sounds: Normal breath sounds.   Abdominal:     General: Non distended     Palpations: soft, non tender   Musculoskeletal:        General: Normal range of motion.   Skin:    General: Skin is warm and dry.     Comments:  Neurological:     General: grossly non focal     Mental Status: awake, alert and oriented to person, place, and time.   Psychiatric:        Mood and Affect: Mood normal.   Lab Results Lab Results  Component Value Date   WBC 5.9 10/18/2023   HGB 9.7 (L) 10/18/2023   HCT 29.8 (L) 10/18/2023   MCV 88.0 10/18/2023   PLT 177.0 10/18/2023    Lab Results  Component Value Date   CREATININE 1.60 (H) 11/01/2023   BUN 26 (H) 11/01/2023   NA 141 11/01/2023   K 5.0 11/01/2023   CL 106 11/01/2023   CO2 26 11/01/2023    Lab Results  Component Value Date   ALT 6 10/18/2023   AST 11 10/18/2023   ALKPHOS 85 10/18/2023   BILITOT 0.3 10/18/2023    Lab Results  Component Value Date  CHOL 118 08/14/2023   HDL 39 (L) 08/14/2023   LDLCALC 54 08/14/2023   TRIG 168 (H) 08/14/2023   CHOLHDL 3.0 08/14/2023   Lab Results  Component Value Date   LABRPR Non Reactive 01/30/2016   No results found for: "HIV1RNAQUANT", "HIV1RNAVL", "CD4TABS"   Microbiology Results for orders placed or performed in visit on 10/18/23  Fungus culture, blood     Status: None   Collection Time: 10/18/23  2:06 PM   Specimen: Urine   Urine  Result Value Ref Range Status   Fungus (Mycology) Culture Final report  Final   Fungal result 1 Comment  Final    Comment: No yeast or mold isolated after 1 week.  Urine Culture     Status: None   Collection Time: 10/18/23  2:06 PM   Specimen: Urine  Result Value Ref Range Status   MICRO NUMBER: [id number]  Final   SPECIMEN QUALITY: Adequate  Final   Sample Source URINE  Final   STATUS: FINAL  Final   Result:   Final    Mixed genital flora isolated. These superficial bacteria are not indicative of a  urinary tract infection. No further organism identification is warranted on this specimen. If clinically indicated, recollect clean-catch, mid-stream urine and transfer  immediately to Urine Culture Transport Tube.    Imaging No results found.  Assessment/Plan # Asymptomatic Candiduria - in the setting of reported h/o colovesical fistula - non neutropenic or non immunocompromised host, no urinary catheter  - s/p treatment with PO fluconazole  200mg  po 1st day followed by 100mg  daily for 14 days by PCP - 4/22 urine cx and blood cx NG  - 5/16 urine cx mixed genital flora and fungal blood cx NG   Plan  - Discussed at length no indication of antifungals without any symptoms esp with no high risk factors as above. He may have plans for colovesical fistula repair in the future at which time PO fluconazole  few days before and after the procedure can be considered if candiduria + at that time. Of note, repeat urine cx on 4/14 and 5/16 unremarkable for fungal growth  - Fu with surgery/PCP regarding discussion for repair of colovesical fistula  - Fu as needed  # C diff diarrhea  - no diarrhea   I spent 61 minutes involved in face-to-face and non-face-to-face activities for this patient on the day of the visit. Professional time spent includes the following activities: Preparing to see the patient (review of tests), Obtaining and reviewing separately obtained history (prior notes from PCP), Performing a medically appropriate examination and evaluation , Ordering medications, Documenting clinical information in the EMR, Independently interpreting results (not separately reported), Communicating results to the patient, Counseling and educating the patient and Care coordination (not separately reported).   Of note, portions of this note may have been created with voice recognition software. While this note has been edited for accuracy, occasional wrong-word or 'sound-a-like' substitutions may have occurred  due to the inherent limitations of voice recognition software.   Melvina Stage, MD [facility] 11/05/2023, 10:57 AM

## 2023-11-06 DIAGNOSIS — R3914 Feeling of incomplete bladder emptying: Secondary | ICD-10-CM | POA: Diagnosis not present

## 2023-11-06 DIAGNOSIS — N130 Hydronephrosis with ureteropelvic junction obstruction: Secondary | ICD-10-CM | POA: Diagnosis not present

## 2023-11-06 DIAGNOSIS — A0472 Enterocolitis due to Clostridium difficile, not specified as recurrent: Secondary | ICD-10-CM | POA: Insufficient documentation

## 2023-11-06 DIAGNOSIS — N401 Enlarged prostate with lower urinary tract symptoms: Secondary | ICD-10-CM | POA: Diagnosis not present

## 2023-11-06 DIAGNOSIS — N3020 Other chronic cystitis without hematuria: Secondary | ICD-10-CM | POA: Diagnosis not present

## 2023-11-06 HISTORY — DX: Enterocolitis due to Clostridium difficile, not specified as recurrent: A04.72

## 2023-11-12 ENCOUNTER — Ambulatory Visit: Admitting: Gastroenterology

## 2023-11-12 ENCOUNTER — Encounter: Payer: Self-pay | Admitting: Gastroenterology

## 2023-11-12 VITALS — BP 124/78 | HR 74 | Ht 72.0 in | Wt 248.4 lb

## 2023-11-12 DIAGNOSIS — D649 Anemia, unspecified: Secondary | ICD-10-CM

## 2023-11-12 DIAGNOSIS — N321 Vesicointestinal fistula: Secondary | ICD-10-CM

## 2023-11-12 DIAGNOSIS — Z8619 Personal history of other infectious and parasitic diseases: Secondary | ICD-10-CM | POA: Diagnosis not present

## 2023-11-12 DIAGNOSIS — K529 Noninfective gastroenteritis and colitis, unspecified: Secondary | ICD-10-CM | POA: Diagnosis not present

## 2023-11-12 MED ORDER — NA SULFATE-K SULFATE-MG SULF 17.5-3.13-1.6 GM/177ML PO SOLN: 1.0000 | Freq: Once | ORAL | 0 refills | Status: AC

## 2023-11-12 NOTE — Patient Instructions (Signed)
 You have been scheduled for a colonoscopy. Please follow written instructions given to you at your visit today.   If you use inhalers (even only as needed), please bring them with you on the day of your procedure.  DO NOT TAKE 7 DAYS PRIOR TO TEST- Trulicity (dulaglutide) Ozempic , Wegovy (semaglutide ) Mounjaro (tirzepatide) Bydureon Bcise (exanatide extended release)  DO NOT TAKE 1 DAY PRIOR TO YOUR TEST Rybelsus (semaglutide ) Adlyxin (lixisenatide) Victoza (liraglutide) Byetta (exanatide) ___________________________________________________________________________  We have sent the following medications to your pharmacy for you to pick up at your convenience: suprep  _______________________________________________________  If your blood pressure at your visit was 140/90 or greater, please contact your primary care physician to follow up on this.  _______________________________________________________  If you are age 75 or older, your body mass index should be between 23-30. Your Body mass index is 33.69 kg/m. If this is out of the aforementioned range listed, please consider follow up with your Primary Care Provider.  If you are age 74 or younger, your body mass index should be between 19-25. Your Body mass index is 33.69 kg/m. If this is out of the aformentioned range listed, please consider follow up with your Primary Care Provider.   ________________________________________________________  The [facility] providers would like to encourage you to use MYCHART to communicate with providers for non-urgent requests or questions.  Due to long hold times on the telephone, sending your provider a message by [facility] may be a faster and more efficient way to get a response.  Please allow 48 business hours for a response.  Please remember that this is for non-urgent requests.  _______________________________________________________  Thank you for trusting me with your gastrointestinal  care!    Kerby Pearson, MD

## 2023-11-12 NOTE — Progress Notes (Signed)
 [facility] Consult Note:  History: Calvin Escobar 11/12/2023  Referring provider: Anthon Kins, MD  Reason for consult/chief complaint: Discuss Colonoscopy (Says he is here to talk about what is required but have problems with catheters)   Subjective  Prior history:  No polyps on colonoscopy with Dr. Grandville Lax in 2012 Office visit with Dr. Dominic Friendly November 2022 for a multitude of symptoms.  Increased risk for procedures of cardiac and other issues, Cologuard recommended.  (Does not appear to have been done) Hospitalized February 2025 with sepsis and altered mental status due to E. 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   He is also found to be C. difficile positive that admission and treated with a 10-day course of oral vancomycin . Saw Dr. Hershell Lose in consultation with an office visit most recently April 2025, planning resection and send request for preoperative colonoscopy. Reportedly no diarrhea at office visit with infectious disease on 11/05/2023  Discussed the use of AI scribe software for clinical note transcription with the patient, who gave verbal consent to proceed.  History of Present Illness Calvin Escobar is a 73 year old male with a history of colovesical fistula who presents for evaluation prior to potential surgical intervention.  He was hospitalized in February for a severe urinary tract infection, during which a colovesical fistula was discovered. No current issues with urinary infections, pain, or dysuria are reported.  He describes his bowel movements as fragmented, erratic and 'real loose,' noting a change from his previous solid stools. This change has been ongoing for approximately six months. No hematochezia or abdominal pain is reported.  He recalls a previous bowel infection with C. difficile during his February hospitalization, which was treated. He recently visited infectious disease  specialists.  He has had a prior colonoscopy and expresses no concerns about undergoing another procedure if necessary.  Calvin Escobar denies dysphagia, odynophagia, nausea vomiting or weight loss  ROS:  Review of Systems  Constitutional:  Negative for appetite change and unexpected weight change.  HENT:  Negative for mouth sores and voice change.   Eyes:  Negative for pain and redness.  Respiratory:  Negative for cough and shortness of breath.   Cardiovascular:  Negative for chest pain and palpitations.  Genitourinary:  Negative for dysuria and hematuria.  Musculoskeletal:  Positive for arthralgias. Negative for myalgias.  Skin:  Negative for pallor and rash.  Neurological:  Negative for weakness and headaches.  Hematological:  Negative for adenopathy.  Psychiatric/Behavioral:         He recognizes that he has some memory challenges     Past Medical History: Past Medical History:  Diagnosis Date   Acute cystitis 05/13/2023   Acute hyponatremia 05/13/2023   Acute metabolic encephalopathy 07/12/2023   Acute respiratory failure with hypoxia (HCC) 06/02/2023   AKI (acute kidney injury) (HCC) 05/13/2023   Allergy seasonal   Ascending aorta dilatation (HCC)    38 mm by 2D echo 04/2021   Cancer (HCC) skin   DCM (dilated cardiomyopathy) (HCC)    nonischemic with normal coronary arteries at cath 06/2019.  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  Endoscopy esophageal stricture 2016     Septic shock from UTI 06/02/2023   Severe sepsis (HCC) 06/03/2023   SKIN CANCER, HX OF 05/10/2007   Facial left check and forehead and r shoulder  F/w derm   Syncope 05/13/2023   Thrombocytopenia (HCC)  06/02/2023   Lab Results      Component    Value    Date           PLT    301    07/19/2023           PLT    324    07/18/2023           PLT    268    07/16/2023           PLT    207    07/14/2023           PLT    228    07/13/2023             Lab Results      Component    Value    Date           ESRSEDRATE    60 (H)    05/08/2023     No results found for: "CRP"          Lab Results      Component    Value       Urinary catheter complication (HCC) 06/24/2023   Urinary dribbling 03/14/2022   Urinary incontinence 11/21/2022      Urinary Incontinence: They are wearing disposable diapers daily due to urinary incontinence, primarily nocturnal, and are currently on Tamsulosin  (Flomax ). We will refer them to Urology for further evaluation and potential treatment options and continue Tamsulosin  until they are seen by Urology.     Urinary tract infection without hematuria 06/02/2023   UTI (urinary tract infection) 07/01/2023     Past Surgical History: Past Surgical History:  Procedure Laterality Date   COLONOSCOPY     RIGHT/LEFT HEART CATH AND CORONARY ANGIOGRAPHY N/A 07/03/2019   Procedure: RIGHT/LEFT HEART CATH AND CORONARY ANGIOGRAPHY;  Surgeon: Mardell Shade, MD;  Location: [facility];  Service: Cardiovascular;  Laterality: N/A;     Family History: Family History  Problem Relation Age of Onset   Hypertension Mother    Cancer Father    Bone cancer Sister     Social History: Social History   Socioeconomic History   Marital status: Married    Spouse name: Not on file   Number of children: Not on file   Years of education: Not on file   Highest education level: Not on file  Occupational History   Not on file  Tobacco Use   Smoking status: Never   Smokeless tobacco: Never  Vaping Use   Vaping status: Never Used  Substance and Sexual Activity   Alcohol use: Not Currently    Alcohol/week: 14.0 standard drinks of alcohol    Types: 14 Cans of beer per week   Drug use:  No   Sexual activity: Not Currently  Other Topics Concern   Not on file  Social History Narrative   Not on file   Social Drivers of Health   Financial Resource Strain: Low Risk  (10/10/2023)   Overall Financial Resource Strain (CARDIA)    Difficulty of Paying Living Expenses: Not hard at all  Food Insecurity: No Food Insecurity (10/10/2023)   Hunger Vital Sign  Worried About Programme researcher, broadcasting/film/video in the Last Year: Never true    Ran Out of Food in the Last Year: Never true  Transportation Needs: No Transportation Needs (10/10/2023)   PRAPARE - Administrator, Civil Service (Medical): No    Lack of Transportation (Non-Medical): No  Physical Activity: Sufficiently Active (10/10/2023)   Exercise Vital Sign    Days of Exercise per Week: 5 days    Minutes of Exercise per Session: 30 min  Stress: No Stress Concern Present (10/10/2023)   [employer] of Occupational Health - Occupational Stress Questionnaire    Feeling of Stress : Not at all  Social Connections: Moderately Isolated (10/10/2023)   Social Connection and Isolation Panel [NHANES]    Frequency of Communication with Friends and Family: More than three times a week    Frequency of Social Gatherings with Friends and Family: More than three times a week    Attends Religious Services: Never    Database administrator or Organizations: No    Attends Banker Meetings: Never    Marital Status: Married    Allergies: Allergies  Allergen Reactions   Albuterol  Palpitations    Supraventricular Tachycardia     Outpatient Meds: Current Outpatient Medications  Medication Sig Dispense Refill   ARIPiprazole  (ABILIFY ) 2 MG tablet Take 1 tablet (2 mg total) by mouth in the morning. 30 tablet 0   busPIRone  (BUSPAR ) 10 MG tablet Take 1 tablet (10 mg total) by mouth 3 (three) times daily. 270 tablet 3   ferrous sulfate  325 (65 FE) MG tablet Take 1 tablet (325 mg total) by mouth 2 (two) times daily with a meal.      fluconazole  (DIFLUCAN ) 200 MG tablet Take 1 tablet (200 mg total) by mouth daily. Take 1 tablet on day one, then only half tablet daily until all tablets gone (15d total) 8 tablet 0   gabapentin  (NEURONTIN ) 100 MG capsule Take 1 capsule (100 mg total) by mouth 3 (three) times daily. 90 capsule 3   melatonin 3 MG TABS tablet Take 1 tablet (3 mg total) by mouth at bedtime as needed (insomnia).     midodrine  (PROAMATINE ) 5 MG tablet Take 1 tablet (5 mg total) by mouth 3 (three) times daily with meals. 90 tablet 2   mirtazapine  (REMERON ) 30 MG tablet Take 1 tablet (30 mg total) by mouth at bedtime. 90 tablet 3   pravastatin  (PRAVACHOL ) 20 MG tablet Take 1 tablet (20 mg total) by mouth at bedtime. 90 tablet 3   primidone  (MYSOLINE ) 50 MG tablet Take 1 tablet (50 mg total) by mouth 2 (two) times daily.     tamsulosin  (FLOMAX ) 0.4 MG CAPS capsule TAKE 1 CAPSULE BY MOUTH EVERY DAY 90 capsule 1   traZODone  (DESYREL ) 100 MG tablet Take 1 tablet (100 mg total) by mouth at bedtime.     No current facility-administered medications for this visit.      ___________________________________________________________________ Objective   Exam:  BP 124/78   Pulse 74   Ht 6' (1.829 m)   Wt 248 lb 6 oz (112.7 kg)   BMI 33.69 kg/m  Wt Readings from Last 3 Encounters:  11/12/23 248 lb 6 oz (112.7 kg)  11/05/23 246 lb (111.6 kg)  10/18/23 245 lb 9.6 oz (111.4 kg)   He is unaccompanied today Pleasant and conversational, variable eye contact  Eyes: sclera anicteric, no redness ENT: oral mucosa moist without lesions, no cervical or supraclavicular lymphadenopathy CV: Regular  without appreciable murmur, no JVD, no peripheral edema Resp: clear to auscultation bilaterally, normal RR and effort noted GI: soft, no tenderness, with active bowel sounds. No guarding or palpable organomegaly noted. Skin; warm and dry, no rash or jaundice noted Neuro: awake, alert and oriented x 3. Normal gross motor function and  fluent speech Calvin Escobar, CMA accompanied exam Normal perianal exam, DRE without tenderness fissure or palpable internal lesion. Rectal swab sample obtained for [facility] C. difficile PCR  Labs:     Latest Ref Rng & Units 10/18/2023    2:06 PM 09/24/2023   10:33 AM 07/19/2023    7:29 AM  CBC  WBC 4.0 - 10.5 K/uL 5.9  6.6  7.8   Hemoglobin 13.0 - 17.0 g/dL 9.7  91.4  9.4   Hematocrit 39.0 - 52.0 % 29.8  31.7  31.2   Platelets 150.0 - 400.0 K/uL 177.0  207.0  301       Latest Ref Rng & Units 11/01/2023    1:39 PM 10/25/2023    3:55 PM 10/18/2023    2:06 PM  CMP  Glucose 70 - 99 mg/dL 782  956  99   BUN 6 - 23 mg/dL 26  22  29    Creatinine 0.40 - 1.50 mg/dL 2.13  0.86  5.78   Sodium 135 - 145 mEq/L 141  141  141   Potassium 3.5 - 5.1 mEq/L 5.0  4.8  5.1   Chloride 96 - 112 mEq/L 106  107  105   CO2 19 - 32 mEq/L 26  27  30    Calcium  8.4 - 10.5 mg/dL 8.7  8.7  9.0   Total Protein 6.0 - 8.3 g/dL   7.4   Total Bilirubin 0.2 - 1.2 mg/dL   0.3   Alkaline Phos 39 - 117 U/L   85   AST 0 - 37 U/L   11   ALT 0 - 53 U/L   6    Iron/TIBC/Ferritin/ %Sat    Component Value Date/Time   IRON 32 (L) 10/18/2023 1406   TIBC 238 (L) 10/18/2023 1406   FERRITIN 420 (H) 10/18/2023 1406   IRONPCTSAT 13 (L) 10/18/2023 1406     Radiologic Studies:  CLINICAL DATA:  73 year old male with history of dementia. Suspected urolithiasis.   EXAM: CT ABDOMEN AND PELVIS WITHOUT AND WITH CONTRAST   TECHNIQUE: Multidetector CT imaging of the abdomen and pelvis was performed following the standard protocol before and following the bolus administration of intravenous contrast.   RADIATION DOSE REDUCTION: This exam was performed according to the departmental dose-optimization program which includes automated exposure control, adjustment of the mA and/or kV according to patient size and/or use of iterative reconstruction technique.   CONTRAST:  75mL OMNIPAQUE  IOHEXOL  350 MG/ML SOLN    COMPARISON:  CT of the abdomen and pelvis 05/16/2023.   FINDINGS: Lower chest: Calcified granuloma in the right middle lobe. Atherosclerotic calcifications in the left main, left anterior descending and right coronary arteries.   Hepatobiliary: No definite suspicious cystic or solid hepatic lesions are confidently identified on today's noncontrast CT examination. Unenhanced appearance of the gallbladder is unremarkable.   Pancreas: No definite pancreatic mass or peripancreatic fluid collections or inflammatory changes are noted on today's noncontrast CT examination.   Spleen: Unremarkable.   Adrenals/Urinary Tract: There is a large amount of gas present in the nondependent aspect of the urinary bladder. There also appears to be some debris dependently in the urinary bladder, which also contains  a small amount of gas (best appreciated on axial image 91 of series 3). Superior aspect of the urinary bladder appears thickened and intimately associated with the adjacent sigmoid colon, centered around what appears to be a large diverticulum, best appreciated on coronal image 56 of series 10. Small amount of gas in the superior wall of the urinary bladder (coronal image 57 of series 10). Moderate right and mild left hydroureteronephrosis. Small amount of gas within the collecting systems of both kidneys (right greater than left). Unenhanced appearance of the kidneys and bilateral adrenal glands is otherwise unremarkable.   Stomach/Bowel: Unenhanced appearance of the stomach is normal. No pathologic dilatation of small bowel or colon. 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 Normal appendix.   Vascular/Lymphatic: Atherosclerosis in the abdominal aorta and pelvic vasculature. No lymphadenopathy noted in the abdomen or pelvis.   Reproductive: Prostate gland and seminal vesicles are unremarkable in  appearance.   Other: No significant volume of ascites.  No pneumoperitoneum.   Musculoskeletal: There are no aggressive appearing lytic or blastic lesions noted in the visualized portions of the skeleton.   IMPRESSION: 1. 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 Urologic consultation is recommended for further clinical evaluation. 2. Aortic atherosclerosis.     Electronically Signed   By: Alexandria Angel M.D.   On: 06/02/2023 10:19   (Images personally reviewed -Memory Staggers MD) _____________  Other  December 2024 echocardiogram  1. Abnormal septal motion. Left ventricular ejection fraction, by  estimation, is 50 to 55%. The left ventricle has low normal function. The  left ventricle has no regional wall motion abnormalities. There is mild  left ventricular hypertrophy. Left  ventricular diastolic parameters were normal.   2. Right ventricular systolic function is normal. The right ventricular  size is normal. There is normal pulmonary artery systolic pressure.   3. The mitral valve is normal in structure. No evidence of mitral valve  regurgitation. No evidence of mitral stenosis.   4. The aortic valve is tricuspid. There is moderate calcification of the  aortic valve. There is moderate thickening of the aortic valve. Aortic  valve regurgitation is not visualized. Aortic valve sclerosis is present,  with no evidence of aortic valve  stenosis.   5. The inferior vena cava is normal in size with greater than 50%  respiratory variability, suggesting right atrial pressure of 3 mmHg.    Encounter Diagnoses  Name Primary?   Colovesical fistula Yes   Chronic diarrhea    History of Clostridioides difficile infection    Normocytic anemia      Assessment and Plan Assessment & Plan Colovesical fistula Colovesical fistula with colon-bladder connection. No urinary symptoms. Surgical intervention discussed with Dr. Candyce Champagne. Colonoscopy planned to evaluate colon and guide surgery. - Arrange colonoscopy to evaluate colon and guide surgical planning. Calvin Escobar was agreeable after discussion of procedure and risks.  The benefits and risks of the planned procedure(s) were described in detail with the patient or (when appropriate) their health care proxy.  Risks were outlined as including, but not limited to, bleeding, infection, perforation, adverse medication reaction leading to cardiac or pulmonary decompensation, pancreatitis (if ERCP).  The limitation of incomplete mucosal visualization was also discussed.  No guarantees or warranties were given. Patient at  increased risk for cardiopulmonary complications of procedure due to medical comorbidities.  Diarrhea Chronic diarrhea for six months. No abdominal pain or blood. Previous C. difficile treated.  We discussed multiple possible causes for altered bowel habits including but not limited to the possibility of recurrent C. difficile. Infectious disease consultation yielded no further intervention.  Rectal swab sample taken today to rule out persistent C. difficile, should have results back in a few days.  Will communicate today's report to primary care and surgical colleague  Because of this patient's memory challenges, our staff will follow-up by phone with his wife later this week to review the colonoscopy scheduling prep instructions that will be sent home with him.   45 minutes were spent on this encounter, including in depth chart review, independent review of results as outlined above, communicating results with the patient directly, face-to-face time with the patient, coordinating care, ordering studies and medications as appropriate, and documentation.    Kerby Pearson  III  CC: Referring provider noted above

## 2023-11-13 ENCOUNTER — Telehealth: Payer: Self-pay

## 2023-11-13 NOTE — Telephone Encounter (Signed)
 Received a call from [facility]. The patient's stool test resulted positive for C Difficile.  Report faxed today.

## 2023-11-13 NOTE — Telephone Encounter (Signed)
 [name],  This is a recurrence of C. difficile after it was treated as an inpatient in February of this year.  It needs retreatment  Dificid (fidaxomicin) 200 mg tablet One tablet twice daily for 10 days Disp #20 tablets, RF zero  This patient has memory difficulties, so it would be better to speak with his wife about this to make sure the treatment plan is clear and that he takes it. We need to hear from her if they have any difficulty obtaining the medication (cost, insurance approval or otherwise). And we need to hear from them in 2-3 weeks (no later than 12/03/2023) with how he is doing regarding the diarrhea.  He currently has a colonoscopy scheduled with me on 12/23/2023 for evaluation of the colon prior to surgical plans for a colovesicular fistula. If Calvin Escobar C. difficile is not cleared after this treatment, then he would need further treatment and the colonoscopy delayed.  Memory Staggers MD  ____________________________  Dr. Gillian Lacrosse,    I read your office note from 11/05/23. Although it is somewhat challenging to get history from him, it turns out this man has had diarrhea for months, perhaps even since the inpatient treatment for the C. difficile in February.  His C. difficile PCR is still positive and I am going to treat him with Dificid. Probably not colonization with ongoing diarrhea. Opted for the [facility] PCR because we can obtain it with a rectal swab in the office, it would likely be challenging for him to manage a full lab specimen for antigen, toxin and PCR given some cognitive and memory issues. We may yet need you for further recommendations if we have difficulty clearing his infection. - Calvin Escobar, [name]

## 2023-11-14 ENCOUNTER — Other Ambulatory Visit: Payer: Self-pay

## 2023-11-14 NOTE — Telephone Encounter (Signed)
 Called the spouse at the preferred telephone number. No answer. Left message of my call.

## 2023-11-14 NOTE — Telephone Encounter (Signed)
 Spouse advised of plan of care. She agrees to this and will explain to her husband. Rx sent to [facility] on [address].

## 2023-11-15 ENCOUNTER — Telehealth: Payer: Self-pay

## 2023-11-15 NOTE — Telephone Encounter (Signed)
 Spoke with patients wife to discuss colonoscopy instructions. She will call back if she has any further questions.

## 2023-11-19 DIAGNOSIS — X32XXXD Exposure to sunlight, subsequent encounter: Secondary | ICD-10-CM | POA: Diagnosis not present

## 2023-11-19 DIAGNOSIS — Z85828 Personal history of other malignant neoplasm of skin: Secondary | ICD-10-CM | POA: Diagnosis not present

## 2023-11-19 DIAGNOSIS — Z08 Encounter for follow-up examination after completed treatment for malignant neoplasm: Secondary | ICD-10-CM | POA: Diagnosis not present

## 2023-11-19 DIAGNOSIS — L570 Actinic keratosis: Secondary | ICD-10-CM | POA: Diagnosis not present

## 2023-11-20 ENCOUNTER — Encounter: Payer: Self-pay | Admitting: Internal Medicine

## 2023-11-20 ENCOUNTER — Ambulatory Visit: Admitting: Internal Medicine

## 2023-11-20 VITALS — BP 110/62 | HR 73 | Temp 98.2°F | Ht 72.0 in

## 2023-11-20 DIAGNOSIS — N4289 Other specified disorders of prostate: Secondary | ICD-10-CM

## 2023-11-20 DIAGNOSIS — D638 Anemia in other chronic diseases classified elsewhere: Secondary | ICD-10-CM | POA: Diagnosis not present

## 2023-11-20 DIAGNOSIS — A0472 Enterocolitis due to Clostridium difficile, not specified as recurrent: Secondary | ICD-10-CM | POA: Diagnosis not present

## 2023-11-20 DIAGNOSIS — N1330 Unspecified hydronephrosis: Secondary | ICD-10-CM | POA: Diagnosis not present

## 2023-11-20 DIAGNOSIS — D649 Anemia, unspecified: Secondary | ICD-10-CM | POA: Diagnosis not present

## 2023-11-20 DIAGNOSIS — N321 Vesicointestinal fistula: Secondary | ICD-10-CM | POA: Diagnosis not present

## 2023-11-20 DIAGNOSIS — B377 Candidal sepsis: Secondary | ICD-10-CM | POA: Diagnosis not present

## 2023-11-20 DIAGNOSIS — N179 Acute kidney failure, unspecified: Secondary | ICD-10-CM

## 2023-11-20 DIAGNOSIS — B3749 Other urogenital candidiasis: Secondary | ICD-10-CM | POA: Diagnosis not present

## 2023-11-20 DIAGNOSIS — I447 Left bundle-branch block, unspecified: Secondary | ICD-10-CM

## 2023-11-20 DIAGNOSIS — N1831 Chronic kidney disease, stage 3a: Secondary | ICD-10-CM

## 2023-11-20 DIAGNOSIS — N390 Urinary tract infection, site not specified: Secondary | ICD-10-CM | POA: Diagnosis not present

## 2023-11-20 LAB — CBC WITH DIFFERENTIAL/PLATELET
Basophils Absolute: 0.1 10*3/uL (ref 0.0–0.1)
Basophils Relative: 1.5 % (ref 0.0–3.0)
Eosinophils Absolute: 0.1 10*3/uL (ref 0.0–0.7)
Eosinophils Relative: 2.8 % (ref 0.0–5.0)
HCT: 29.8 % — ABNORMAL LOW (ref 39.0–52.0)
Hemoglobin: 9.7 g/dL — ABNORMAL LOW (ref 13.0–17.0)
Lymphocytes Relative: 31.3 % (ref 12.0–46.0)
Lymphs Abs: 1.6 10*3/uL (ref 0.7–4.0)
MCHC: 32.4 g/dL (ref 30.0–36.0)
MCV: 87.9 fl (ref 78.0–100.0)
Monocytes Absolute: 0.4 10*3/uL (ref 0.1–1.0)
Monocytes Relative: 7.4 % (ref 3.0–12.0)
Neutro Abs: 2.9 10*3/uL (ref 1.4–7.7)
Neutrophils Relative %: 57 % (ref 43.0–77.0)
Platelets: 168 10*3/uL (ref 150.0–400.0)
RBC: 3.39 Mil/uL — ABNORMAL LOW (ref 4.22–5.81)
RDW: 15.1 % (ref 11.5–15.5)
WBC: 5.1 10*3/uL (ref 4.0–10.5)

## 2023-11-20 NOTE — Patient Instructions (Addendum)
 It was a pleasure seeing you today! Your health and satisfaction are our top priorities.  Calvin Curt, MD  Your Providers PCP: Anthon Kins, MD,  [phone]) Referring Provider: Anthon Kins, MD,  [phone]) Care Team Provider: Jacqueline Matsu, MD,  [phone]) Care Team Provider: Denman Fischer, MD,  [phone]) Care Team Provider: [facility] [place], [place],  [phone]) Care Team Provider: [facility],  [phone]) Care Team Provider: [facility], [place],  [phone]) Care Team Provider: Adriana Albany, LCSW Care Team Provider: Carmie Chough, MD,  [phone]) Care Team Provider: Albertina Hugger, MD,  [phone]) Care Team Provider: Candyce Champagne, MD,  [phone]) Care Team Provider: Adriana Albany, LCSW Care Team Provider: Samson Croak, MD,  [phone]) Care Team Provider: Jude Norton, NP,  [phone])     NEXT STEPS: [x]  Early Intervention: Schedule sooner appointment, call our on-call services, or go to emergency room if there is any significant Increase in pain or discomfort New or worsening symptoms Sudden or severe changes in your health [x]  Flexible Follow-Up: We recommend a No follow-ups on file. for optimal routine care. This allows for progress monitoring and treatment adjustments. [x]  Preventive Care: Schedule your annual preventive care visit! It's typically covered by insurance and helps identify potential health issues early. [x]  Lab & X-ray Appointments: Incomplete tests scheduled today, or call to schedule. X-rays: [facility] (M-F, 8:30am-noon or 1pm-5pm). [x]  Medical Information Release: Sign a release form at front desk to obtain relevant medical information we don't have.  MAKING THE MOST OF OUR FOCUSED 20 MINUTE APPOINTMENTS: [x]   Clearly state your top concerns at the beginning of the visit to focus our discussion [x]   If you anticipate you will need more  time, please inform the front desk during scheduling - we can book multiple appointments in the same week. [x]   If you have transportation problems- use our convenient video appointments or ask about transportation support. [x]   We can get down to business faster if you use MyChart to update information before the visit and submit non-urgent questions before your visit. Thank you for taking the time to provide details through MyChart.  Let our nurse know and she can import this information into your encounter documents.  Arrival and Wait Times: [x]   Arriving on time ensures that everyone receives prompt attention. [x]   Early morning (8a) and afternoon (1p) appointments tend to have shortest wait times. [x]   Unfortunately, we cannot delay appointments for late arrivals or hold slots during phone calls.  Getting Answers and Following Up [x]   Simple Questions & Concerns: For quick questions or basic follow-up after your visit, reach us  at [phone] or MyChart messaging. [x]   Complex Concerns: If your concern is more complex, scheduling an appointment might be best. Discuss this with the staff to find the most suitable option. [x]   Lab & Imaging Results: We'll contact you directly if results are abnormal or you don't use MyChart. Most normal results will be on MyChart within 2-3 business days, with a review message from Dr. Boston Byers. Haven't heard back in 2 weeks? Need results sooner? Contact us  at [phone]. [x]   Referrals: Our referral coordinator will manage specialist referrals. The specialist's office should contact you within 2 weeks to schedule an appointment. Call us  if you haven't heard from them after 2 weeks.  Staying Connected [x]   MyChart: Activate your MyChart for the fastest way to access results and message us .  See the last page of this paperwork for instructions on how to activate.  Bring to Your Next Appointment [x]   Medications: Please bring all your medication bottles to  your next appointment to ensure we have an accurate record of your prescriptions. [x]   Health Diaries: If you're monitoring any health conditions at home, keeping a diary of your readings can be very helpful for discussions at your next appointment.  Billing [x]   X-ray & Lab Orders: These are billed by separate companies. Contact the invoicing company directly for questions or concerns. [x]   Visit Charges: Discuss any billing inquiries with our administrative services team.  Your Satisfaction Matters [x]   Share Your Experience: We strive for your satisfaction! If you have any complaints, or preferably compliments, please let Dr. Boston Byers know directly or contact our Practice Administrators, Olinda Bertrand or [employer], by asking at the front desk.   Reviewing Your Records [x]   Review this early draft of your clinical encounter notes below and the final encounter summary tomorrow on MyChart after its been completed.  All orders placed so far are visible here: Acute kidney injury (HCC)  Candida UTI  Colovesical fistula  C. difficile colitis

## 2023-11-20 NOTE — Progress Notes (Unsigned)
 ==============================  [facility]: [phone]   -- Medical Office Visit --  Patient: Calvin Escobar      Age: 73 y.o.       Sex:  male  Date:   11/20/2023 Today's Healthcare Provider: Anthon Kins, MD  ==============================   Chief Complaint: acute kidney injury Associated with recurrent urinary tract infection (UTI), candida urinary tract infection (UTI), colovesical fistula  Discussed the use of AI scribe software for clinical note transcription with the patient, who gave verbal consent to proceed.  History of Present Illness [name] is a 73 year old male with recurrent urinary tract infections and newly diagnosed residual/recurrent Clostridium difficile infection who presents for follow-up of his infections and kidney function.  He has a history of recurrent urinary tract infections leading to multiple hospitalizations. He often does not notice the infection until it is severe. He experiences difficulty with urination, describing it as 'dribbling out' for over six months. His urine has shown fungal presence, and a test indicated a possible connection between his intestines and bladder. He does not currently have a Foley catheter. Recent blood work showed low hemoglobin at 9.7 and normal white blood cell counts. There was blood and a high white blood cell count in his urine.  He was diagnosed with a Clostridium difficile infection following a stool test, which led to the postponement of a planned colonoscopy. He has been experiencing diarrhea for several months and was prescribed Dificid, an antibiotic, which he has been taking for about a week without improvement in his symptoms.  His recent glomerular filtration rate was 42%, indicating compromised kidney function. He has been treated with two antifungals in the past for a fungal urinary tract infection, specifically Candida, and is currently on a ten-day course  of antibiotics for his Clostridium difficile infection. He does not consume alcohol.  CONTEXT: 73yo male with rapidly worsening CKD, persistent pyuria despite negative bacterial cultures, known prostatic mass and colovesical fistula. Recent ultrasound (09/28/2023) showed left hydronephrosis. Suspicion for fluconazole -resistant Candida UTI. creatinine rise of 0.3 in one month extremely concerning for obstructive nephropathy requiring prompt intervention. Patient prefers morning appointments due to fatigue later in day.  Lab Results  Component Value Date   GFR 42.05 (L) 11/01/2023   GFR 42.70 (L) 10/18/2023   GFR 55.53 (L) 09/24/2023   GFR 61.91 09/16/2023   GFR 54.54 (L) 08/14/2023   GFR 57.80 (L) 06/24/2023   GFR 62.20 01/15/2023   GFR 64.95 11/21/2022   GFR 63.69 08/29/2022   EGFR 48 (L) 10/25/2023   EGFR 81 04/14/2021   EGFR 63 09/09/2020   Lab Results  Component Value Date   BILIRUBINUR NEGATIVE 07/12/2023   PROTEINUR NEGATIVE 11/20/2023   UROBILINOGEN 0.2 09/12/2013   LEUKOCYTESUR 3+ (A) 11/20/2023    Background Reviewed: Problem List: has Bipolar affective disorder, depressed (HCC); Chronic bronchitis (HCC); Dementia (HCC); HLD (hyperlipidemia); DCM (dilated cardiomyopathy) (HCC); OSA treated with BiPAP; HFrEF (heart failure with reduced ejection fraction) (HCC); Ascending aorta dilatation (HCC); Alcoholism in remission [facility]); BPH (benign prostatic hyperplasia); PAC (premature atrial contraction); Primary insomnia; Generalized arthritis; Morbid obesity (HCC); Hypertension; Limp; Gynecomastia, male; Chronic back pain; Chronic kidney disease, stage 3a (HCC); Normocytic anemia; Tremor; Lack of appetite; Anemia of chronic disease; AKI (acute kidney injury) (HCC); Obesity, Class II, BMI 35-39.9; LBBB (left bundle branch block); Colovesical fistula; Recurrent UTI; GERD (gastroesophageal reflux disease); Weight loss, abnormal; Hypocalcemia; Hypoalbuminemia; Hydronephrosis of left kidney;  Prostatic mass; Candiduria;  Medication management; and C. difficile diarrhea on their problem list. Past Medical History:  has a past medical history of Acute cystitis (05/13/2023), Acute hyponatremia (05/13/2023), Acute metabolic encephalopathy (07/12/2023), Acute respiratory failure with hypoxia (HCC) (06/02/2023), AKI (acute kidney injury) (HCC) (05/13/2023), Allergy (seasonal), Ascending aorta dilatation (HCC), Cancer (HCC) (skin), Candidal UTI (urinary tract infection) (09/22/2023), DCM (dilated cardiomyopathy) (HCC), Dementia (HCC), Depression, Diarrhea (05/13/2023), E coli bacteremia (05/14/2023), High anion gap metabolic acidosis (07/13/2023), History of Clostridioides difficile colitis (08/15/2023), Hyperlipidemia, Lithium  toxicity (01/29/2016), OSA treated with BiPAP, PUD (peptic ulcer disease) (01/12/2015), Septic shock from UTI (06/02/2023), Severe sepsis (HCC) (06/03/2023), SKIN CANCER, HX OF (05/10/2007), Syncope (05/13/2023), Thrombocytopenia (HCC) (06/02/2023), Urinary catheter complication (HCC) (06/24/2023), Urinary dribbling (03/14/2022), Urinary incontinence (11/21/2022), Urinary tract infection without hematuria (06/02/2023), and UTI (urinary tract infection) (07/01/2023). Past Surgical History:   has a past surgical history that includes Colonoscopy and RIGHT/LEFT HEART CATH AND CORONARY ANGIOGRAPHY (N/A, 07/03/2019). Social History:   reports that he has never smoked. He has never used smokeless tobacco. He reports that he does not currently use alcohol after a past usage of about 14.0 standard drinks of alcohol per week. He reports that he does not use drugs. Family History:  family history includes Bone cancer in his sister; Cancer in his father; Hypertension in his mother. Allergies:  is allergic to albuterol .   Medication Reconciliation: Current Outpatient Medications on File Prior to Visit  Medication Sig   ARIPiprazole  (ABILIFY ) 2 MG tablet Take 1 tablet (2 mg total) by mouth in  the morning.   busPIRone  (BUSPAR ) 10 MG tablet Take 1 tablet (10 mg total) by mouth 3 (three) times daily.   carvedilol  (COREG ) 12.5 MG tablet Take 12.5 mg by mouth 2 (two) times daily.   ferrous sulfate  325 (65 FE) MG tablet Take 1 tablet (325 mg total) by mouth 2 (two) times daily with a meal.   finasteride (PROSCAR) 5 MG tablet Take 5 mg by mouth daily.   fluconazole  (DIFLUCAN ) 200 MG tablet Take 1 tablet (200 mg total) by mouth daily. Take 1 tablet on day one, then only half tablet daily until all tablets gone (15d total)   folic acid  (FOLVITE ) 1 MG tablet Take 1 mg by mouth daily.   gabapentin  (NEURONTIN ) 100 MG capsule Take 1 capsule (100 mg total) by mouth 3 (three) times daily.   losartan  (COZAAR ) 50 MG tablet Take 50 mg by mouth daily.   melatonin 3 MG TABS tablet Take 1 tablet (3 mg total) by mouth at bedtime as needed (insomnia).   midodrine  (PROAMATINE ) 5 MG tablet Take 1 tablet (5 mg total) by mouth 3 (three) times daily with meals.   mirtazapine  (REMERON ) 30 MG tablet Take 1 tablet (30 mg total) by mouth at bedtime.   pravastatin  (PRAVACHOL ) 20 MG tablet Take 1 tablet (20 mg total) by mouth at bedtime.   primidone  (MYSOLINE ) 50 MG tablet Take 1 tablet (50 mg total) by mouth 2 (two) times daily.   tamsulosin  (FLOMAX ) 0.4 MG CAPS capsule TAKE 1 CAPSULE BY MOUTH EVERY DAY   traZODone  (DESYREL ) 100 MG tablet Take 1 tablet (100 mg total) by mouth at bedtime.   No current facility-administered medications on file prior to visit.  There are no discontinued medications.   Physical Exam:    11/20/2023   12:57 PM 11/12/2023   10:00 AM 11/05/2023   12:54 PM  Vitals with BMI  Height 6' 0 6' 0 6' 0  Weight  248 lbs 6 oz  246 lbs  BMI  33.68 33.36  Systolic 110 124 161  Diastolic 62 78 79  Pulse 73 74 76  Vital signs reviewed.  Nursing notes reviewed. Weight trend reviewed. Physical Exam General Appearance:  No acute distress appreciable.   Well-groomed, healthy-appearing male.  Well  proportioned with no abnormal fat distribution.  Good muscle tone. Pulmonary:  Normal work of breathing at rest, no respiratory distress apparent. SpO2: 98 %  Musculoskeletal: All extremities are intact.  Neurological:  Awake, alert, oriented, and engaged.  No obvious focal neurological deficits or cognitive impairments.  Sensorium seems unclouded.   Speech is clear and coherent with logical content. Psychiatric:  Appropriate mood, pleasant and cooperative demeanor, thoughtful and engaged during the exam   Results:    10/10/2023   11:58 AM 09/16/2023    2:10 PM 03/07/2023    1:35 PM 01/15/2023   12:02 PM  PHQ 2/9 Scores  PHQ - 2 Score 0 2 1 3   PHQ- 9 Score 0 7 4 12    Results LABS Hb: 9.7 g/dL (09/60/4540) WBC: within normal limits (11/13/2023) Urinalysis: blood present, elevated WBC (11/13/2023) C. difficile: positive (11/13/2023) GFR: 42 mL/min/1.73 m (11/13/2023)  PATHOLOGY Candida: positive    Results for orders placed or performed in visit on 11/20/23  CBC w/Diff  Result Value Ref Range   WBC 5.1 4.0 - 10.5 K/uL   RBC 3.39 (L) 4.22 - 5.81 Mil/uL   Hemoglobin 9.7 (L) 13.0 - 17.0 g/dL   HCT 98.1 (L) 19.1 - 47.8 %   MCV 87.9 78.0 - 100.0 fl   MCHC 32.4 30.0 - 36.0 g/dL   RDW 29.5 62.1 - 30.8 %   Platelets 168.0 150.0 - 400.0 K/uL   Neutrophils Relative % 57.0 43.0 - 77.0 %   Lymphocytes Relative 31.3 12.0 - 46.0 %   Monocytes Relative 7.4 3.0 - 12.0 %   Eosinophils Relative 2.8 0.0 - 5.0 %   Basophils Relative 1.5 0.0 - 3.0 %   Neutro Abs 2.9 1.4 - 7.7 K/uL   Lymphs Abs 1.6 0.7 - 4.0 K/uL   Monocytes Absolute 0.4 0.1 - 1.0 K/uL   Eosinophils Absolute 0.1 0.0 - 0.7 K/uL   Basophils Absolute 0.1 0.0 - 0.1 K/uL  Protein / creatinine ratio, urine  Result Value Ref Range   Creatinine, Urine 37 20 - 320 mg/dL   Protein/Creat Ratio 657 (H) 25 - 148 mg/g creat   Protein/Creatinine Ratio 0.162 (H) 0.025 - 0.148 mg/mg creat   Total Protein, Urine 6 5 - 25 mg/dL   Urinalysis, Complete  Result Value Ref Range   Color, Urine YELLOW YELLOW   APPearance CLEAR CLEAR   Specific Gravity, Urine 1.009 1.001 - 1.035   pH 6.5 5.0 - 8.0   Glucose, UA NEGATIVE NEGATIVE   Bilirubin Urine NEGATIVE NEGATIVE   Ketones, ur NEGATIVE NEGATIVE   Hgb urine dipstick NEGATIVE NEGATIVE   Protein, ur NEGATIVE NEGATIVE   Nitrite NEGATIVE NEGATIVE   Leukocytes,Ua 3+ (A) NEGATIVE   WBC, UA > OR = 60 (A) 0 - 5 /HPF   RBC / HPF NONE SEEN 0 - 2 /HPF   Squamous Epithelial / HPF 0-5 < OR = 5 /HPF   Bacteria, UA FEW (A) NONE SEEN /HPF   Hyaline Cast NONE SEEN NONE SEEN /LPF   Office Visit on 11/20/2023  Component Date Value Ref Range Status   WBC 11/20/2023 5.1  4.0 - 10.5 K/uL Final   RBC 11/20/2023 3.39 (L)  4.22 - 5.81 Mil/uL Final   Hemoglobin 11/20/2023 9.7 (L)  13.0 - 17.0 g/dL Final   HCT 62/95/2841 29.8 (L)  39.0 - 52.0 % Final   MCV 11/20/2023 87.9  78.0 - 100.0 fl Final   MCHC 11/20/2023 32.4  30.0 - 36.0 g/dL Final   RDW 32/44/0102 15.1  11.5 - 15.5 % Final   Platelets 11/20/2023 168.0  150.0 - 400.0 K/uL Final   Neutrophils Relative % 11/20/2023 57.0  43.0 - 77.0 % Final   Lymphocytes Relative 11/20/2023 31.3  12.0 - 46.0 % Final   Monocytes Relative 11/20/2023 7.4  3.0 - 12.0 % Final   Eosinophils Relative 11/20/2023 2.8  0.0 - 5.0 % Final   Basophils Relative 11/20/2023 1.5  0.0 - 3.0 % Final   Neutro Abs 11/20/2023 2.9  1.4 - 7.7 K/uL Final   Lymphs Abs 11/20/2023 1.6  0.7 - 4.0 K/uL Final   Monocytes Absolute 11/20/2023 0.4  0.1 - 1.0 K/uL Final   Eosinophils Absolute 11/20/2023 0.1  0.0 - 0.7 K/uL Final   Basophils Absolute 11/20/2023 0.1  0.0 - 0.1 K/uL Final   Creatinine, Urine 11/20/2023 37  20 - 320 mg/dL Final   Protein/Creat Ratio 11/20/2023 162 (H)  25 - 148 mg/g creat Final   Protein/Creatinine Ratio 11/20/2023 0.162 (H)  0.025 - 0.148 mg/mg creat Final   Total Protein, Urine 11/20/2023 6  5 - 25 mg/dL Final   Color, Urine 72/53/6644 YELLOW   YELLOW Final   APPearance 11/20/2023 CLEAR  CLEAR Final   Specific Gravity, Urine 11/20/2023 1.009  1.001 - 1.035 Final   pH 11/20/2023 6.5  5.0 - 8.0 Final   Glucose, UA 11/20/2023 NEGATIVE  NEGATIVE Final   Bilirubin Urine 11/20/2023 NEGATIVE  NEGATIVE Final   Ketones, ur 11/20/2023 NEGATIVE  NEGATIVE Final   Hgb urine dipstick 11/20/2023 NEGATIVE  NEGATIVE Final   Protein, ur 11/20/2023 NEGATIVE  NEGATIVE Final   Nitrite 11/20/2023 NEGATIVE  NEGATIVE Final   Leukocytes,Ua 11/20/2023 3+ (A)  NEGATIVE Final   WBC, UA 11/20/2023 > OR = 60 (A)  0 - 5 /HPF Final   RBC / HPF 11/20/2023 NONE SEEN  0 - 2 /HPF Final   Squamous Epithelial / HPF 11/20/2023 0-5  < OR = 5 /HPF Final   Bacteria, UA 11/20/2023 FEW (A)  NONE SEEN /HPF Final   Hyaline Cast 11/20/2023 NONE SEEN  NONE SEEN /LPF Final  Lab on 11/01/2023  Component Date Value Ref Range Status   Sodium 11/01/2023 141  135 - 145 mEq/L Final   Potassium 11/01/2023 5.0  3.5 - 5.1 mEq/L Final   Chloride 11/01/2023 106  96 - 112 mEq/L Final   CO2 11/01/2023 26  19 - 32 mEq/L Final   Glucose, Bld 11/01/2023 119 (H)  70 - 99 mg/dL Final   BUN 03/47/4259 26 (H)  6 - 23 mg/dL Final   Creatinine, Ser 11/01/2023 1.60 (H)  0.40 - 1.50 mg/dL Final   GFR 56/38/7564 42.05 (L)  >60.00 mL/min Final   Calcium  11/01/2023 8.7  8.4 - 10.5 mg/dL Final  Lab on 33/29/5188  Component Date Value Ref Range Status   Glucose, Bld 10/25/2023 104 (H)  65 - 99 mg/dL Final   BUN 41/66/0630 22  7 - 25 mg/dL Final   Creat 16/06/930 1.52 (H)  0.70 - 1.28 mg/dL Final   eGFR 35/57/3220 48 (L)  > OR = 60 mL/min/1.1m2 Final   BUN/Creatinine Ratio 10/25/2023 14  6 - 22 (calc) Final   Sodium 10/25/2023 141  135 - 146 mmol/L Final   Potassium 10/25/2023 4.8  3.5 - 5.3 mmol/L Final   Chloride 10/25/2023 107  98 - 110 mmol/L Final   CO2 10/25/2023 27  20 - 32 mmol/L Final   Calcium  10/25/2023 8.7  8.6 - 10.3 mg/dL Final  Office Visit on 10/18/2023  Component Date Value  Ref Range Status   Fungus (Mycology) Culture 10/18/2023 Final report   Final   Fungal result 1 10/18/2023 Comment   Final   Sodium 10/18/2023 141  135 - 145 mEq/L Final   Potassium 10/18/2023 5.1  3.5 - 5.1 mEq/L Final   Chloride 10/18/2023 105  96 - 112 mEq/L Final   CO2 10/18/2023 30  19 - 32 mEq/L Final   Glucose, Bld 10/18/2023 99  70 - 99 mg/dL Final   BUN 11/91/4782 29 (H)  6 - 23 mg/dL Final   Creatinine, Ser 10/18/2023 1.58 (H)  0.40 - 1.50 mg/dL Final   Total Bilirubin 10/18/2023 0.3  0.2 - 1.2 mg/dL Final   Alkaline Phosphatase 10/18/2023 85  39 - 117 U/L Final   AST 10/18/2023 11  0 - 37 U/L Final   ALT 10/18/2023 6  0 - 53 U/L Final   Total Protein 10/18/2023 7.4  6.0 - 8.3 g/dL Final   Albumin  10/18/2023 4.0  3.5 - 5.2 g/dL Final   GFR 95/62/1308 42.70 (L)  >60.00 mL/min Final   Calcium  10/18/2023 9.0  8.4 - 10.5 mg/dL Final   MICRO NUMBER: 65/78/4696 [id number]   Final   SPECIMEN QUALITY: 10/18/2023 Adequate   Final   Sample Source 10/18/2023 URINE   Final   STATUS: 10/18/2023 FINAL   Final   Result: 10/18/2023    Final                   Value:Mixed genital flora isolated. These superficial bacteria are not indicative of a urinary tract infection. No further organism identification is warranted on this specimen. If clinically indicated, recollect clean-catch, mid-stream urine and transfer  immediately to Urine Culture Transport Tube.    WBC 10/18/2023 5.9  4.0 - 10.5 K/uL Final   RBC 10/18/2023 3.38 (L)  4.22 - 5.81 Mil/uL Final   Hemoglobin 10/18/2023 9.7 (L)  13.0 - 17.0 g/dL Final   HCT 24/40/1027 29.8 (L)  39.0 - 52.0 % Final   MCV 10/18/2023 88.0  78.0 - 100.0 fl Final   MCHC 10/18/2023 32.7  30.0 - 36.0 g/dL Final   RDW 25/36/6440 16.2 (H)  11.5 - 15.5 % Final   Platelets 10/18/2023 177.0  150.0 - 400.0 K/uL Final   Neutrophils Relative % 10/18/2023 63.3  43.0 - 77.0 % Final   Lymphocytes Relative 10/18/2023 23.5  12.0 - 46.0 % Final   Monocytes Relative  10/18/2023 10.3  3.0 - 12.0 % Final   Eosinophils Relative 10/18/2023 2.2  0.0 - 5.0 % Final   Basophils Relative 10/18/2023 0.7  0.0 - 3.0 % Final   Neutro Abs 10/18/2023 3.8  1.4 - 7.7 K/uL Final   Lymphs Abs 10/18/2023 1.4  0.7 - 4.0 K/uL Final   Monocytes Absolute 10/18/2023 0.6  0.1 - 1.0 K/uL Final   Eosinophils Absolute 10/18/2023 0.1  0.0 - 0.7 K/uL Final   Basophils Absolute 10/18/2023 0.0  0.0 - 0.1 K/uL Final   Iron 10/18/2023 32 (L)  50 - 180 mcg/dL Final   TIBC 34/74/2595 238 (L)  250 - 425 mcg/dL (calc) Final   %SAT 16/03/9603 13 (L)  20 - 48 % (calc) Final   Ferritin 10/18/2023 420 (H)  24 - 380 ng/mL Final   Color, Urine 10/18/2023 YELLOW  YELLOW Final   APPearance 10/18/2023 CLEAR  CLEAR Final   Specific Gravity, Urine 10/18/2023 1.009  1.001 - 1.035 Final   pH 10/18/2023 7.0  5.0 - 8.0 Final   Glucose, UA 10/18/2023 NEGATIVE  NEGATIVE Final   Bilirubin Urine 10/18/2023 NEGATIVE  NEGATIVE Final   Ketones, ur 10/18/2023 NEGATIVE  NEGATIVE Final   Hgb urine dipstick 10/18/2023 TRACE (A)  NEGATIVE Final   Protein, ur 10/18/2023 1+ (A)  NEGATIVE Final   Nitrite 10/18/2023 NEGATIVE  NEGATIVE Final   Leukocytes,Ua 10/18/2023 3+ (A)  NEGATIVE Final   WBC, UA 10/18/2023 40-60 (A)  0 - 5 /HPF Final   RBC / HPF 10/18/2023 NONE SEEN  0 - 2 /HPF Final   Squamous Epithelial / HPF 10/18/2023 NONE SEEN  < OR = 5 /HPF Final   Bacteria, UA 10/18/2023 NONE SEEN  NONE SEEN /HPF Final   Hyaline Cast 10/18/2023 0-5 (A)  NONE SEEN /LPF Final   Note 10/18/2023    Final  Lab on 09/24/2023  Component Date Value Ref Range Status   MICRO NUMBER: 09/24/2023 [id number]   Final   SPECIMEN QUALITY: 09/24/2023 Adequate   Final   Sample Source 09/24/2023 URINE   Final   STATUS: 09/24/2023 FINAL   Final   Result: 09/24/2023 No Growth   Final   Sodium 09/24/2023 139  135 - 145 mEq/L Final   Potassium 09/24/2023 4.9  3.5 - 5.1 mEq/L Final   Chloride 09/24/2023 103  96 - 112 mEq/L Final   CO2  09/24/2023 27  19 - 32 mEq/L Final   Glucose, Bld 09/24/2023 97  70 - 99 mg/dL Final   BUN 14/78/2956 23  6 - 23 mg/dL Final   Creatinine, Ser 09/24/2023 1.27  0.40 - 1.50 mg/dL Final   GFR 21/30/8657 55.53 (L)  >60.00 mL/min Final   Calcium  09/24/2023 8.9  8.4 - 10.5 mg/dL Final   INR 84/69/6295 1.0  0.8 - 1.0 ratio Final   Prothrombin Time 09/24/2023 11.1  9.6 - 13.1 sec Final   WBC 09/24/2023 6.6  4.0 - 10.5 K/uL Final   RBC 09/24/2023 3.61 (L)  4.22 - 5.81 Mil/uL Final   Hemoglobin 09/24/2023 10.1 (L)  13.0 - 17.0 g/dL Final   HCT 28/41/3244 31.7 (L)  39.0 - 52.0 % Final   MCV 09/24/2023 87.7  78.0 - 100.0 fl Final   MCHC 09/24/2023 31.8  30.0 - 36.0 g/dL Final   RDW 06/06/7251 17.3 (H)  11.5 - 15.5 % Final   Platelets 09/24/2023 207.0  150.0 - 400.0 K/uL Final   Neutrophils Relative % 09/24/2023 60.5  43.0 - 77.0 % Final   Lymphocytes Relative 09/24/2023 27.7  12.0 - 46.0 % Final   Monocytes Relative 09/24/2023 8.1  3.0 - 12.0 % Final   Eosinophils Relative 09/24/2023 2.4  0.0 - 5.0 % Final   Basophils Relative 09/24/2023 1.3  0.0 - 3.0 % Final   Neutro Abs 09/24/2023 4.0  1.4 - 7.7 K/uL Final   Lymphs Abs 09/24/2023 1.8  0.7 - 4.0 K/uL Final   Monocytes Absolute 09/24/2023 0.5  0.1 - 1.0 K/uL Final   Eosinophils Absolute 09/24/2023 0.2  0.0 - 0.7 K/uL Final   Basophils Absolute 09/24/2023 0.1  0.0 - 0.1  K/uL Final   MICRO NUMBER: 09/24/2023 [id number]   Final   SPECIMEN QUALITY: 09/24/2023 Suboptimal   Final   Source 09/24/2023 RT BASEL ARM   Final   STATUS: 09/24/2023 FINAL   Final   Result: 09/24/2023 No growth after 5 days Inspection of blood culture bottles indicates that an inadequate volume of blood may have been collected for the detection of sepsis.   Final   COMMENT: 09/24/2023 Aerobic and anaerobic bottle received.   Final   MICRO NUMBER: 09/24/2023 [id number]   Final   SPECIMEN QUALITY: 09/24/2023 Suboptimal   Final   Source 09/24/2023 LF ARM   Final   STATUS:  09/24/2023 FINAL   Final   Result: 09/24/2023 No growth after 5 days Inspection of blood culture bottles indicates that an inadequate volume of blood may have been collected for the detection of sepsis.   Final   COMMENT: 09/24/2023 Aerobic and anaerobic bottle received.   Final  Office Visit on 09/16/2023  Component Date Value Ref Range Status   Color, Urine 09/16/2023 YELLOW  YELLOW Final   APPearance 09/16/2023 CLOUDY (A)  CLEAR Final   Specific Gravity, Urine 09/16/2023 1.010  1.001 - 1.035 Final   pH 09/16/2023 6.5  5.0 - 8.0 Final   Glucose, UA 09/16/2023 NEGATIVE  NEGATIVE Final   Bilirubin Urine 09/16/2023 NEGATIVE  NEGATIVE Final   Ketones, ur 09/16/2023 NEGATIVE  NEGATIVE Final   Hgb urine dipstick 09/16/2023 TRACE (A)  NEGATIVE Final   Protein, ur 09/16/2023 NEGATIVE  NEGATIVE Final   Nitrites, Initial 09/16/2023 NEGATIVE  NEGATIVE Final   Leukocyte Esterase 09/16/2023 3+ (A)  NEGATIVE Final   WBC, UA 09/16/2023 > OR = 60 (A)  0 - 5 /HPF Final   RBC / HPF 09/16/2023 3-10 (A)  0 - 2 /HPF Final   Squamous Epithelial / HPF 09/16/2023 NONE SEEN  < OR = 5 /HPF Final   Bacteria, UA 09/16/2023 FEW (A)  NONE SEEN /HPF Final   Hyaline Cast 09/16/2023 0-5 (A)  NONE SEEN /LPF Final   Note 09/16/2023    Final   Sodium 09/16/2023 139  135 - 145 mEq/L Final   Potassium 09/16/2023 4.8  3.5 - 5.1 mEq/L Final   Chloride 09/16/2023 106  96 - 112 mEq/L Final   CO2 09/16/2023 26  19 - 32 mEq/L Final   Glucose, Bld 09/16/2023 124 (H)  70 - 99 mg/dL Final   BUN 78/46/9629 21  6 - 23 mg/dL Final   Creatinine, Ser 09/16/2023 1.16  0.40 - 1.50 mg/dL Final   Total Bilirubin 09/16/2023 0.3  0.2 - 1.2 mg/dL Final   Alkaline Phosphatase 09/16/2023 82  39 - 117 U/L Final   AST 09/16/2023 13  0 - 37 U/L Final   ALT 09/16/2023 6  0 - 53 U/L Final   Total Protein 09/16/2023 6.6  6.0 - 8.3 g/dL Final   Albumin  09/16/2023 3.8  3.5 - 5.2 g/dL Final   GFR 52/84/1324 61.91  >60.00 mL/min Final   Calcium   09/16/2023 8.6  8.4 - 10.5 mg/dL Final   Iron 40/03/2724 66  42 - 165 ug/dL Final   Transferrin 36/64/4034 146.0 (L)  212.0 - 360.0 mg/dL Final   Saturation Ratios 09/16/2023 32.3  20.0 - 50.0 % Final   Ferritin 09/16/2023 413.1 (H)  22.0 - 322.0 ng/mL Final   TIBC 09/16/2023 204.4 (L)  250.0 - 450.0 mcg/dL Final   MICRO NUMBER: 74/25/9563 [id number]   Final  SPECIMEN QUALITY: 09/16/2023 Adequate   Final   Sample Source 09/16/2023 URINE   Final   STATUS: 09/16/2023 FINAL   Final   ISOLATE 1: 09/16/2023 Candida albicans (A)   Final   REFLEXIVE URINE CULTURE 09/16/2023    Final  Office Visit on 08/14/2023  Component Date Value Ref Range Status   TSH 08/14/2023 1.760  0.450 - 4.500 uIU/mL Final   Cholesterol 08/14/2023 118  <200 mg/dL Final   HDL 86/57/8469 39 (L)  > OR = 40 mg/dL Final   Triglycerides 62/95/2841 168 (H)  <150 mg/dL Final   LDL Cholesterol (Calc) 08/14/2023 54  mg/dL (calc) Final   Total CHOL/HDL Ratio 08/14/2023 3.0  <3.2 (calc) Final   Non-HDL Cholesterol (Calc) 08/14/2023 79  <130 mg/dL (calc) Final   Sodium 44/06/270 141  135 - 145 mEq/L Final   Potassium 08/14/2023 4.8  3.5 - 5.1 mEq/L Final   Chloride 08/14/2023 104  96 - 112 mEq/L Final   CO2 08/14/2023 29  19 - 32 mEq/L Final   Glucose, Bld 08/14/2023 84  70 - 99 mg/dL Final   BUN 53/66/4403 26 (H)  6 - 23 mg/dL Final   Creatinine, Ser 08/14/2023 1.29  0.40 - 1.50 mg/dL Final   Total Bilirubin 08/14/2023 0.2  0.2 - 1.2 mg/dL Final   Alkaline Phosphatase 08/14/2023 86  39 - 117 U/L Final   AST 08/14/2023 15  0 - 37 U/L Final   ALT 08/14/2023 9  0 - 53 U/L Final   Total Protein 08/14/2023 6.8  6.0 - 8.3 g/dL Final   Albumin  08/14/2023 3.8  3.5 - 5.2 g/dL Final   GFR 47/42/5956 54.54 (L)  >60.00 mL/min Final   Calcium  08/14/2023 8.9  8.4 - 10.5 mg/dL Final   Hgb L8V MFr Bld 08/14/2023 5.3  4.6 - 6.5 % Final   PSA 08/14/2023 0.61  0.10 - 4.00 ng/mL Final   Color, Urine 08/14/2023 YELLOW  YELLOW Final    APPearance 08/14/2023 CLEAR  CLEAR Final   Specific Gravity, Urine 08/14/2023 1.012  1.001 - 1.035 Final   pH 08/14/2023 7.0  5.0 - 8.0 Final   Glucose, UA 08/14/2023 NEGATIVE  NEGATIVE Final   Bilirubin Urine 08/14/2023 NEGATIVE  NEGATIVE Final   Ketones, ur 08/14/2023 NEGATIVE  NEGATIVE Final   Hgb urine dipstick 08/14/2023 2+ (A)  NEGATIVE Final   Protein, ur 08/14/2023 NEGATIVE  NEGATIVE Final   Nitrites, Initial 08/14/2023 NEGATIVE  NEGATIVE Final   Leukocyte Esterase 08/14/2023 TRACE (A)  NEGATIVE Final   WBC, UA 08/14/2023 10-20 (A)  0 - 5 /HPF Final   RBC / HPF 08/14/2023 3-10 (A)  0 - 2 /HPF Final   Squamous Epithelial / HPF 08/14/2023 NONE SEEN  < OR = 5 /HPF Final   Bacteria, UA 08/14/2023 FEW (A)  NONE SEEN /HPF Final   Calcium  Oxalate Crystal 08/14/2023 MODERATE (A)  NONE OR FEW /HPF Final   Hyaline Cast 08/14/2023 NONE SEEN  NONE SEEN /LPF Final   Note 08/14/2023    Final   PTH 08/14/2023 65  16 - 77 pg/mL Final   Uric Acid, Serum 08/14/2023 6.5  4.0 - 7.8 mg/dL Final   Microalb, Ur 56/43/3295 3.7 (H)  0.0 - 1.9 mg/dL Final   Creatinine,U 18/84/1660 51.7  mg/dL Final   Microalb Creat Ratio 08/14/2023 72.2 (H)  0.0 - 30.0 mg/g Final   Prealbumin 08/14/2023 26  21 - 43 mg/dL Final   Iron 63/06/6008 76  50 - 180 mcg/dL Final   TIBC 81/19/1478 228 (L)  250 - 425 mcg/dL (calc) Final   %SAT 29/56/2130 33  20 - 48 % (calc) Final   Ferritin 08/14/2023 684 (H)  24 - 380 ng/mL Final   CRP 08/14/2023 <1.0  0.5 - 20.0 mg/dL Final   Sed Rate 86/57/8469 50 (H)  0 - 20 mm/hr Final   Creatinine, Urine 08/14/2023 54  20 - 320 mg/dL Final   Protein/Creat Ratio 08/14/2023 241 (H)  25 - 148 mg/g creat Final   Protein/Creatinine Ratio 08/14/2023 0.241 (H)  0.025 - 0.148 mg/mg creat Final   Total Protein, Urine 08/14/2023 13  5 - 25 mg/dL Final   VITD 62/95/2841 36.65  30.00 - 100.00 ng/mL Final   MICRO NUMBER: 08/14/2023 [id number]   Final   SPECIMEN QUALITY: 08/14/2023 Adequate   Final    Sample Source 08/14/2023 URINE   Final   STATUS: 08/14/2023 FINAL   Final   Result: 08/14/2023 No Growth   Final   REFLEXIVE URINE CULTURE 08/14/2023    Final  No results displayed because visit has over 200 results.    No results displayed because visit has over 200 results.    Office Visit on 06/24/2023  Component Date Value Ref Range Status   WBC 06/24/2023 8.8  4.0 - 10.5 K/uL Final   RBC 06/24/2023 3.52 (L)  4.22 - 5.81 Mil/uL Final   Hemoglobin 06/24/2023 9.6 (L)  13.0 - 17.0 g/dL Final   HCT 72/53/6644 30.2 (L)  39.0 - 52.0 % Final   MCV 06/24/2023 85.8  78.0 - 100.0 fl Final   MCHC 06/24/2023 31.7  30.0 - 36.0 g/dL Final   RDW 03/47/4259 17.7 (H)  11.5 - 15.5 % Final   Platelets 06/24/2023 256.0  150.0 - 400.0 K/uL Final   Neutrophils Relative % 06/24/2023 52.9  43.0 - 77.0 % Final   Lymphocytes Relative 06/24/2023 29.9  12.0 - 46.0 % Final   Monocytes Relative 06/24/2023 9.2  3.0 - 12.0 % Final   Eosinophils Relative 06/24/2023 5.4 (H)  0.0 - 5.0 % Final   Basophils Relative 06/24/2023 2.6  0.0 - 3.0 % Final   Neutro Abs 06/24/2023 4.7  1.4 - 7.7 K/uL Final   Lymphs Abs 06/24/2023 2.6  0.7 - 4.0 K/uL Final   Monocytes Absolute 06/24/2023 0.8  0.1 - 1.0 K/uL Final   Eosinophils Absolute 06/24/2023 0.5  0.0 - 0.7 K/uL Final   Basophils Absolute 06/24/2023 0.2 (H)  0.0 - 0.1 K/uL Final   Sodium 06/24/2023 137  135 - 145 mEq/L Final   Potassium 06/24/2023 3.9  3.5 - 5.1 mEq/L Final   Chloride 06/24/2023 102  96 - 112 mEq/L Final   CO2 06/24/2023 28  19 - 32 mEq/L Final   Glucose, Bld 06/24/2023 128 (H)  70 - 99 mg/dL Final   BUN 56/38/7564 14  6 - 23 mg/dL Final   Creatinine, Ser 06/24/2023 1.23  0.40 - 1.50 mg/dL Final   Total Bilirubin 06/24/2023 0.3  0.2 - 1.2 mg/dL Final   Alkaline Phosphatase 06/24/2023 61  39 - 117 U/L Final   AST 06/24/2023 19  0 - 37 U/L Final   ALT 06/24/2023 9  0 - 53 U/L Final   Total Protein 06/24/2023 7.9  6.0 - 8.3 g/dL Final   Albumin   06/24/2023 3.8  3.5 - 5.2 g/dL Final   GFR 33/29/5188 57.80 (L)  >60.00 mL/min Final   Calcium  06/24/2023  9.0  8.4 - 10.5 mg/dL Final  There may be more visits with results that are not included.  No image results found. 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 Evaluate for fungal bezoar, renal involvement, and structural abnormalities. CT 05/2023 showed possible colovesical fistula. Stage 3 CKD (GFR 62). History of urosepsis. Assess for perinephric fluid collections EXAM: RENAL / URINARY TRACT ULTRASOUND COMPLETE COMPARISON:  CT 06/02/2023 FINDINGS: Right Kidney: Renal measurements: 9.5 x 4.8 x 4.3 cm = volume: 101 mL. 1.7 x 1.5 cm cortical cyst superior pole. No hydronephrosis. Normal parenchymal echotexture. Left Kidney: Renal measurements: 11.3 x 5.8 x 5.2 cm = volume: 179 mL. Echogenicity within normal limits. Mild hydronephrosis. Bladder: Physiologically distended. Mass possibly prostate protrudes into the base the bladder. Other: None. IMPRESSION: 1. Mild left hydronephrosis. 2. No perinephric fluid collections. 3. 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 Electronically Signed   By: Nicoletta Barrier M.D.   On: 09/28/2023 11:38     Component     Latest Ref Rng 10/18/2023  WBC     4.0 - 10.5 K/uL 5.9   RBC     4.22 - 5.81 Mil/uL 3.38 (L)   Hemoglobin     13.0 - 17.0 g/dL 9.7 (L)   HCT     40.9 - 52.0 % 29.8 (L)   MCV     78.0 - 100.0 fl 88.0   MCHC     30.0 - 36.0 g/dL 81.1   RDW     91.4 - 78.2 % 16.2 (H)   Platelets     150.0 - 400.0 K/uL 177.0   Neutrophils     43.0 - 77.0 % 63.3   Lymphocytes     12.0 - 46.0 % 23.5   Monocytes Relative     3.0 - 12.0 % 10.3   Eosinophil     0.0 - 5.0 % 2.2   Basophil     0.0 - 3.0 % 0.7   NEUT#     1.4 - 7.7 K/uL 3.8   Lymphs Abs     0.7 - 4.0 K/uL 1.4   Monocyte #     0.1 - 1.0 K/uL 0.6   Eosinophils Absolute      0.0 - 0.7 K/uL 0.1   Basophils Absolute     0.0 - 0.1 K/uL 0.0   Color, Urine     YELLOW  YELLOW   Appearance     CLEAR  CLEAR   Specific Gravity, Urine     1.001 - 1.035  1.009   pH     5.0 - 8.0  7.0   Glucose, UA     NEGATIVE  NEGATIVE   Bilirubin Urine     NEGATIVE  NEGATIVE   Ketones, ur     NEGATIVE  NEGATIVE   Hgb urine dipstick     NEGATIVE  TRACE !   Protein     NEGATIVE  1+ !   Nitrite     NEGATIVE  NEGATIVE   Leukocytes,Ua     NEGATIVE  3+ !   WBC, UA     0 - 5 /HPF 40-60 !   RBC / HPF     0 - 2 /HPF NONE SEEN   Squamous Epithelial / HPF     < OR = 5 /HPF NONE SEEN   Bacteria, UA     NONE SEEN /HPF  NONE SEEN   Hyaline Cast     NONE SEEN /LPF 0-5 !   NOTE: -   Sodium     135 - 145 mEq/L 141   Potassium     3.5 - 5.1 mEq/L 5.1   Chloride     96 - 112 mEq/L 105   CO2     19 - 32 mEq/L 30   Glucose     70 - 99 mg/dL 99   BUN     6 - 23 mg/dL 29 (H)   Creatinine     0.40 - 1.50 mg/dL 6.57 (H)   Total Bilirubin     0.2 - 1.2 mg/dL 0.3   Alkaline Phosphatase     39 - 117 U/L 85   AST     0 - 37 U/L 11   ALT     0 - 53 U/L 6   Total Protein     6.0 - 8.3 g/dL 7.4   Albumin      3.5 - 5.2 g/dL 4.0   GFR     >84.69 mL/min 42.70 (L)   Calcium      8.4 - 10.5 mg/dL 9.0   eGFR     > OR = 60 mL/min/1.58m2   BUN/Creatinine Ratio     6 - 22 (calc)   MICRO NUMBER: [id number]   SPECIMEN QUALITY: Adequate   Source   STATUS: FINAL   Result Mixed genital flora isolated. These superficial bacteria are not indicative of a urinary tract infection. No further organism identification is warranted on this specimen. If clinically indicated, recollect clean-catch, mid-stream urine and transfer .   Comment   Sample Source URINE   Iron     50 - 180 mcg/dL 32 (L)   TIBC     324 - 425 mcg/dL (calc) 401 (L)   %SAT     20 - 48 % (calc) 13 (L)   Ferritin     24 - 380 ng/mL 420 (H)   INR     0.8 - 1.0 ratio   Prothrombin Time     9.6 - 13.1 sec   Fungus  (Mycology) Culture Final report   Fungal result 1 Comment    Component     Latest Ref Rng 10/25/2023 11/01/2023  WBC     4.0 - 10.5 K/uL    RBC     4.22 - 5.81 Mil/uL    Hemoglobin     13.0 - 17.0 g/dL    HCT     02.7 - 25.3 %    MCV     78.0 - 100.0 fl    MCHC     30.0 - 36.0 g/dL    RDW     66.4 - 40.3 %    Platelets     150.0 - 400.0 K/uL    Neutrophils     43.0 - 77.0 %    Lymphocytes     12.0 - 46.0 %    Monocytes Relative     3.0 - 12.0 %    Eosinophil     0.0 - 5.0 %    Basophil     0.0 - 3.0 %    NEUT#     1.4 - 7.7 K/uL    Lymphs Abs     0.7 - 4.0 K/uL    Monocyte #     0.1 - 1.0 K/uL    Eosinophils Absolute     0.0 - 0.7 K/uL  Basophils Absolute     0.0 - 0.1 K/uL    Color, Urine     YELLOW     Appearance     CLEAR     Specific Gravity, Urine     1.001 - 1.035     pH     5.0 - 8.0     Glucose, UA     NEGATIVE     Bilirubin Urine     NEGATIVE     Ketones, ur     NEGATIVE     Hgb urine dipstick     NEGATIVE     Protein     NEGATIVE     Nitrite     NEGATIVE     Leukocytes,Ua     NEGATIVE     WBC, UA     0 - 5 /HPF    RBC / HPF     0 - 2 /HPF    Squamous Epithelial / HPF     < OR = 5 /HPF    Bacteria, UA     NONE SEEN /HPF    Hyaline Cast     NONE SEEN /LPF    NOTE:    Sodium     135 - 145 mEq/L 141  141   Potassium     3.5 - 5.1 mEq/L 4.8  5.0   Chloride     96 - 112 mEq/L 107  106   CO2     19 - 32 mEq/L 27  26   Glucose     70 - 99 mg/dL 960 (H)  454 (H)   BUN     6 - 23 mg/dL 22  26 (H)   Creatinine     0.40 - 1.50 mg/dL 0.98 (H)  1.19 (H)   Total Bilirubin     0.2 - 1.2 mg/dL    Alkaline Phosphatase     39 - 117 U/L    AST     0 - 37 U/L    ALT     0 - 53 U/L    Total Protein     6.0 - 8.3 g/dL    Albumin      3.5 - 5.2 g/dL    GFR     >14.78 mL/min  42.05 (L)   Calcium      8.4 - 10.5 mg/dL 8.7  8.7   eGFR     > OR = 60 mL/min/1.38m2 48 (L)    BUN/Creatinine Ratio     6 - 22 (calc) 14    MICRO  NUMBER:    SPECIMEN QUALITY:    Source    STATUS:    Result    Comment    Sample Source    Iron     50 - 180 mcg/dL    TIBC     295 - 621 mcg/dL (calc)    %SAT     20 - 48 % (calc)    Ferritin     24 - 380 ng/mL    INR     0.8 - 1.0 ratio    Prothrombin Time     9.6 - 13.1 sec    Fungus (Mycology) Culture    Fungal result 1      Legend: (L) Low (H) High ! Abnormal  Lab Results  Component Value Date   GFR 42.05 (L) 11/01/2023   GFR 42.70 (L) 10/18/2023   GFR 55.53 (L) 09/24/2023   GFR 61.91 09/16/2023   GFR  54.54 (L) 08/14/2023   GFR 57.80 (L) 06/24/2023   GFR 62.20 01/15/2023   GFR 64.95 11/21/2022   GFR 63.69 08/29/2022   EGFR 48 (L) 10/25/2023   EGFR 81 04/14/2021   EGFR 63 09/09/2020         Assessment & Plan Acute kidney injury [facility]) Will order lab testing to guide management. Lab Results  Component Value Date   GFR 42.05 (L) 11/01/2023   GFR 42.70 (L) 10/18/2023   GFR 55.53 (L) 09/24/2023   GFR 61.91 09/16/2023   GFR 54.54 (L) 08/14/2023   EGFR 48 (L) 10/25/2023   EGFR 81 04/14/2021   EGFR 63 09/09/2020   Candida UTI A previous Candida UTI was treated with antifungals. Plan to recheck for fungal presence in blood and urine to ensure infection clearance. Perform fungal cultures on blood and urine to check for Candida presence. 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 C. difficile colitis Being treated with Dificid - poor response. Clostridium difficile infection is confirmed by stool testing, with persistent diarrhea despite the current antibiotic regimen, Dificid. This infection has delayed a planned colonoscopy. Continue Dificid for 10 days and advise reporting to a gastroenterologist if diarrhea persists before the colonoscopy. Consider switching antibiotics if there is no improvement. Chronic kidney disease, stage 3a (HCC) Kidney function is reduced with a GFR at 42%, nearing the dialysis threshold. Recurrent  UTIs may contribute to kidney damage, and fluctuating function is likely due to aging-related kidney disease. Monitoring is crucial to prevent further decline and potential dialysis. Monitor kidney function with cystatin C and protein to creatinine ratio. Encourage increased fluid intake to protect kidney function. Schedule follow-up in 2-4 weeks to reassess kidney function. AKI (acute kidney injury) (HCC)  Anemia of chronic disease Lab Results  Component Value Date/Time   HGB 9.7 (L) 11/20/2023 01:28 PM   HGB 9.7 (L) 10/18/2023 02:06 PM   HGB 10.1 (L) 09/24/2023 10:33 AM   HGB 9.4 (L) 07/19/2023 07:29 AM   HGB 9.4 (L) 07/18/2023 07:57 AM   HGB 14.1 10/28/2019 04:29 PM  Persistent,stable. Associated with chronic kidney disease and chronic illness. Candidal UTI (urinary tract infection) Recurrent UTIs present severely before detection, possibly due to an intestinal-bladder connection. Hematuria and pyuria indicate ongoing infection, with a risk of hospitalization and altered mental status if untreated. There is concern for potential kidney damage from recurrent infections. Order urinalysis to assess current infection status and monitor kidney function with cystatin C and protein to creatinine ratio. Encourage increased fluid intake, particularly water, to aid infection clearance and protect kidneys. Schedule follow-up in 2-4 weeks to reassess kidney function and infection status. Coordinate with a urologist and nephrologist for comprehensive management. 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 Hydronephrosis of left kidney Defer to urology, he seems to not be having any obstructive symptom(s) currently Normocytic anemia Hemoglobin level is low at 9.7, likely due to chronic infection suppressing bone marrow function. This is considered less critical than kidney function and infection  issues. Recheck hemoglobin levels during follow-up testing.  Regular follow-up is necessary to monitor kidney function, infection status, and overall health. Coordination with specialists is essential for comprehensive care. Schedule a follow-up appointment in 2-4 weeks and ensure appointments with a urologist and nephrologist. Monitor for symptom or lab result changes requiring expedited specialist consultation.         Orders Placed in Encounter:   Orders  Placed This Encounter  Procedures   Urine Culture   Fungus culture, blood   Urinalysis w microscopic + reflex cultur   Cystatin C with Glomerular Filtration Rate, Estimated (eGFR)   CBC w/Diff   Protein / creatinine ratio, urine   Urinalysis, Complete        This document was synthesized by artificial intelligence ([employer]bridge) using HIPAA-compliant recording of the clinical interaction;   We discussed the use of AI scribe software for clinical note transcription with the patient, who gave verbal consent to proceed. additional Info: This encounter employed state-of-the-art, real-time, collaborative documentation. The patient actively reviewed and assisted in updating their electronic medical record on a shared screen, ensuring transparency and facilitating joint problem-solving for the problem list, overview, and plan. This approach promotes accurate, informed care. The treatment plan was discussed and reviewed in detail, including medication safety, potential side effects, and all patient questions. We confirmed understanding and comfort with the plan. Follow-up instructions were established, including contacting the office for any concerns, returning if symptoms worsen, persist, or new symptoms develop, and precautions for potential emergency department visits.

## 2023-11-21 ENCOUNTER — Encounter: Payer: Self-pay | Admitting: Internal Medicine

## 2023-11-21 ENCOUNTER — Encounter: Payer: Self-pay | Admitting: Gastroenterology

## 2023-11-21 LAB — URINE CULTURE
MICRO NUMBER:: 16596118
Result:: NO GROWTH
SPECIMEN QUALITY:: ADEQUATE

## 2023-11-21 LAB — URINALYSIS, COMPLETE
Bilirubin Urine: NEGATIVE
Glucose, UA: NEGATIVE
Hgb urine dipstick: NEGATIVE
Hyaline Cast: NONE SEEN /LPF
Ketones, ur: NEGATIVE
Nitrite: NEGATIVE
Protein, ur: NEGATIVE
RBC / HPF: NONE SEEN /HPF (ref 0–2)
Specific Gravity, Urine: 1.009 (ref 1.001–1.035)
WBC, UA: >=60 /HPF — AB (ref 0–5)
pH: 6.5 (ref 5.0–8.0)

## 2023-11-21 LAB — PROTEIN / CREATININE RATIO, URINE
Creatinine, Urine: 37 mg/dL (ref 20–320)
Protein/Creat Ratio: 162 mg/g{creat} — ABNORMAL HIGH (ref 25–148)
Protein/Creatinine Ratio: 0.162 mg/mg{creat} — ABNORMAL HIGH (ref 0.025–0.148)
Total Protein, Urine: 6 mg/dL (ref 5–25)

## 2023-11-21 NOTE — Assessment & Plan Note (Signed)
 Hemoglobin level is low at 9.7, likely due to chronic infection suppressing bone marrow function. This is considered less critical than kidney function and infection issues. Recheck hemoglobin levels during follow-up testing.

## 2023-11-21 NOTE — Assessment & Plan Note (Signed)
 Workup underway to localize and treat, if present.

## 2023-11-21 NOTE — Assessment & Plan Note (Signed)
 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

## 2023-11-21 NOTE — Assessment & Plan Note (Signed)
 Recurrent UTIs present severely before detection, possibly due to an intestinal-bladder connection. Hematuria and pyuria indicate ongoing infection, with a risk of hospitalization and altered mental status if untreated. There is concern for potential kidney damage from recurrent infections. Order urinalysis to assess current infection status and monitor kidney function with cystatin C and protein to creatinine ratio. Encourage increased fluid intake, particularly water, to aid infection clearance and protect kidneys. Schedule follow-up in 2-4 weeks to reassess kidney function and infection status. Coordinate with a urologist and nephrologist for comprehensive management.

## 2023-11-21 NOTE — Addendum Note (Signed)
 Addended by: Santhosh Gulino G on: 11/21/2023 07:20 PM   Modules accepted: Level of Service

## 2023-11-21 NOTE — Assessment & Plan Note (Signed)
 Kidney function is reduced with a GFR at 42%, nearing the dialysis threshold. Recurrent UTIs may contribute to kidney damage, and fluctuating function is likely due to aging-related kidney disease. Monitoring is crucial to prevent further decline and potential dialysis. Monitor kidney function with cystatin C and protein to creatinine ratio. Encourage increased fluid intake to protect kidney function. Schedule follow-up in 2-4 weeks to reassess kidney function.

## 2023-11-21 NOTE — Assessment & Plan Note (Addendum)
>>  ASSESSMENT AND PLAN FOR ANEMIA OF CHRONIC DISEASE WRITTEN ON 11/21/2023  7:17 PM BY Jeriko Kowalke G, MD  Lab Results  Component Value Date/Time   HGB 9.7 (L) 11/20/2023 01:28 PM   HGB 9.7 (L) 10/18/2023 02:06 PM   HGB 10.1 (L) 09/24/2023 10:33 AM   HGB 9.4 (L) 07/19/2023 07:29 AM   HGB 9.4 (L) 07/18/2023 07:57 AM   HGB 14.1 10/28/2019 04:29 PM  Persistent,stable. Associated with chronic kidney disease and chronic illness.   >>ASSESSMENT AND PLAN FOR NORMOCYTIC ANEMIA WRITTEN ON 11/21/2023  7:17 PM BY Arlester Keehan G, MD  Hemoglobin level is low at 9.7, likely due to chronic infection suppressing bone marrow function. This is considered less critical than kidney function and infection issues. Recheck hemoglobin levels during follow-up testing.

## 2023-11-21 NOTE — Assessment & Plan Note (Signed)
 Defer to urology, he seems to not be having any obstructive symptom(s) currently

## 2023-11-22 LAB — CYSTATIN C WITH GLOMERULAR FILTRATION RATE, ESTIMATED (EGFR)
CYSTATIN C: 2.28 mg/L — ABNORMAL HIGH (ref 0.52–1.16)
eGFR: 25 mL/min/{1.73_m2} — ABNORMAL LOW (ref >=60)

## 2023-11-23 ENCOUNTER — Ambulatory Visit: Payer: Self-pay | Admitting: Internal Medicine

## 2023-11-23 DIAGNOSIS — N179 Acute kidney failure, unspecified: Secondary | ICD-10-CM

## 2023-11-23 NOTE — Progress Notes (Signed)
 We need patient urine output status check, and make sure no NSAIDs and have him hold losartan , go to emergency room if low urine output, slam fluids to avoid that, recheck these tests this week (I reordered just need patient informed/arranged) Also we want to try to get nephrologist to see him this week if possible.    Regarding colonoscopy for colovesical fistula- I don't think he responding on diarrhea to dificid but benefits of sooner definitive treatment may outweigh waiting for resolution - Dr Legrand: The severe AKI may change the risk/benefit analysis of holding off on the colonoscopy and definitive fistula management. This may warrant a discussion with GI and/or Surgery to see if a more aggressive, multi-disciplinary approach is now needed.  I'm trying to get nephrology as soon as possible.  Patient says he feels fine but I'm concerned on trajectory.  EGFR 25 (L) 11/20/2023   EGFR 48 (L) 10/25/2023   EGFR 81 04/14/2021   EGFR 63 09/09/2020  Summary of Key Lab Findings (from 11/20/2023) Kidney Function (Significant Worsening): eGFR (Cystatin C): 25 mL/min/1.6m. This is a sharp decline from 48 one month ago and represents Stage 4 CKD, indicating a severe decrease in kidney function. This confirms the diagnosis of an Acute Kidney Injury (AKI) on top of pre-existing Chronic Kidney Disease (CKD). Cystatin C: 2.28 mg/L (High). This is a sensitive marker for kidney function and confirms the low eGFR. Urine Protein/Creatinine Ratio: 162 mg/g (High). While still elevated, this has actually improved from 241 mg/g three months ago. Urinalysis & Cultures (Inflammation without typical bacterial infection): Urinalysis: Shows significant pyuria (3+ Leukocytes, >60 WBC) and Few Bacteria. This indicates inflammation or infection in the urinary tract. Urine Culture: No Growth. This is a critical result. The presence of WBCs without growth on a standard bacterial culture strongly suggests either a non-bacterial  cause (like fungal infection, sterile inflammation from the fistula) or an infection that requires special media. Blood Fungus Culture: Preliminary. This is pending and will be crucial to rule out fungemia. CBC (Chronic Anemia): Hemoglobin/Hematocrit: 9.7 g/dL / 70.1% (Low). This indicates a moderate, chronic, normocytic anemia, which is stable from last month and likely anemia of chronic disease/inflammation given his complex medical history. WBC Count: 5.1 (Normal). There is no leukocytosis to suggest a systemic bacterial infection at this time. Clinical Interpretation We are concerned about the drop in eGFR from 48 to 25 in one month is a significant event. This represents a severe AKI superimposed on CKD. Overall this is a complex picture with several potential drivers for this acute decline: Pre-renal Azotemia: The unresolved, recurrent C. diff is a likely primary contributor. Persistent diarrhea can lead to significant volume depletion, reducing perfusion to the kidneys and causing a pre-renal AKI. Inflammatory State: The colovesical fistula creates a constant state of inflammation and potential for polymicrobial seeding of the urinary tract, which can contribute to kidney injury. Urinary Tract Issues: While the bacterial culture was negative, Candida urinary tract infection may not be fully resolved. The pyuria could be driven by this fungal infection.  We have sent a dedicated urine fungal culture. The combination of pyuria with a No Growth bacterial culture in the setting of a fistula and a diagnosis of Candida UTI points away from a simple bacterial UTI and towards a more complex inflammatory or fungal process.

## 2023-11-25 ENCOUNTER — Telehealth: Payer: Self-pay

## 2023-11-25 NOTE — Telephone Encounter (Unsigned)
 Copied from CRM [id number]. Topic: Referral - Status >> Nov 25, 2023 11:03 AM Drema MATSU wrote: Reason for CRM: Patient is requesting a callback from Dr. Jesus regarding his referral to Nephrology. He is needing some information.

## 2023-11-25 NOTE — Telephone Encounter (Signed)
 Spoke with pt wife about labs to stop losartan  to push fluids and if low outpt to go to the ER pt wife stated he was drinking plenty at this time. She stated that he has been going to the bathroom normal at this time as well. She is going to call the nephrologist to see if he could be seen this week as well.

## 2023-11-25 NOTE — Telephone Encounter (Signed)
 Spoke with pt via phone will be back in Wednesday to collect urine and recheck labs.

## 2023-11-26 NOTE — Progress Notes (Signed)
 Spoke with pcp pt will be in tomorrow to recheck labs. If pt does not come provider stated will advise to go to the ER

## 2023-11-27 ENCOUNTER — Other Ambulatory Visit (INDEPENDENT_AMBULATORY_CARE_PROVIDER_SITE_OTHER)

## 2023-11-27 DIAGNOSIS — N179 Acute kidney failure, unspecified: Secondary | ICD-10-CM | POA: Diagnosis not present

## 2023-11-27 DIAGNOSIS — N138 Other obstructive and reflux uropathy: Secondary | ICD-10-CM | POA: Diagnosis not present

## 2023-11-27 DIAGNOSIS — N189 Chronic kidney disease, unspecified: Secondary | ICD-10-CM | POA: Diagnosis not present

## 2023-11-27 DIAGNOSIS — N400 Enlarged prostate without lower urinary tract symptoms: Secondary | ICD-10-CM | POA: Diagnosis not present

## 2023-11-27 DIAGNOSIS — N401 Enlarged prostate with lower urinary tract symptoms: Secondary | ICD-10-CM | POA: Diagnosis not present

## 2023-11-28 ENCOUNTER — Ambulatory Visit: Payer: Self-pay | Admitting: Internal Medicine

## 2023-11-28 LAB — BASIC METABOLIC PANEL WITH GFR
BUN: 33 mg/dL — ABNORMAL HIGH (ref 6–23)
CO2: 29 meq/L (ref 19–32)
Calcium: 9 mg/dL (ref 8.4–10.5)
Chloride: 104 meq/L (ref 96–112)
Creatinine, Ser: 1.89 mg/dL — ABNORMAL HIGH (ref 0.40–1.50)
GFR: 34.42 mL/min — ABNORMAL LOW (ref >60.00)
Glucose, Bld: 105 mg/dL — ABNORMAL HIGH (ref 70–99)
Potassium: 4.8 meq/L (ref 3.5–5.1)
Sodium: 141 meq/L (ref 135–145)

## 2023-11-28 NOTE — Progress Notes (Signed)
 We just spoke on the phone about the worsening kidney function and patient agreed to go to the emergency room.  First thing in the morning was recommended but there are some logistics issues so he plans on waiting till Saturday morning I explained that I think that it could be he has C. difficile or it could be poor flow and obstruction and he might need a repeat Foley catheter and then monitoring for improvement.  He says he is making urine output drinking plenty of fluid and that the diarrhea from the C. difficile is better and that he feels fine

## 2023-11-29 LAB — FUNGUS CULTURE, BLOOD

## 2023-11-30 ENCOUNTER — Emergency Department (HOSPITAL_COMMUNITY)
Admission: EM | Admit: 2023-11-30 | Discharge: 2023-11-30 | Disposition: A | Discharge status: Home / Self Care | Attending: Emergency Medicine | Admitting: Emergency Medicine

## 2023-11-30 ENCOUNTER — Other Ambulatory Visit: Payer: Self-pay

## 2023-11-30 ENCOUNTER — Encounter (HOSPITAL_COMMUNITY): Payer: Self-pay

## 2023-11-30 DIAGNOSIS — F0284 Dementia in other diseases classified elsewhere, unspecified severity, with anxiety: Secondary | ICD-10-CM | POA: Diagnosis present

## 2023-11-30 DIAGNOSIS — K648 Other hemorrhoids: Secondary | ICD-10-CM | POA: Diagnosis not present

## 2023-11-30 DIAGNOSIS — N139 Obstructive and reflux uropathy, unspecified: Secondary | ICD-10-CM | POA: Diagnosis present

## 2023-11-30 DIAGNOSIS — F319 Bipolar disorder, unspecified: Secondary | ICD-10-CM | POA: Diagnosis present

## 2023-11-30 DIAGNOSIS — J42 Unspecified chronic bronchitis: Secondary | ICD-10-CM | POA: Diagnosis present

## 2023-11-30 DIAGNOSIS — Z1621 Resistance to vancomycin: Secondary | ICD-10-CM | POA: Diagnosis present

## 2023-11-30 DIAGNOSIS — F0283 Dementia in other diseases classified elsewhere, unspecified severity, with mood disturbance: Secondary | ICD-10-CM | POA: Diagnosis present

## 2023-11-30 DIAGNOSIS — Z66 Do not resuscitate: Secondary | ICD-10-CM | POA: Diagnosis present

## 2023-11-30 DIAGNOSIS — N390 Urinary tract infection, site not specified: Secondary | ICD-10-CM | POA: Diagnosis not present

## 2023-11-30 DIAGNOSIS — H353132 Nonexudative age-related macular degeneration, bilateral, intermediate dry stage: Secondary | ICD-10-CM | POA: Diagnosis not present

## 2023-11-30 DIAGNOSIS — N35912 Unspecified bulbous urethral stricture, male: Secondary | ICD-10-CM | POA: Diagnosis not present

## 2023-11-30 DIAGNOSIS — Z8249 Family history of ischemic heart disease and other diseases of the circulatory system: Secondary | ICD-10-CM | POA: Diagnosis not present

## 2023-11-30 DIAGNOSIS — I77810 Thoracic aortic ectasia: Secondary | ICD-10-CM | POA: Diagnosis present

## 2023-11-30 DIAGNOSIS — N321 Vesicointestinal fistula: Secondary | ICD-10-CM | POA: Diagnosis present

## 2023-11-30 DIAGNOSIS — H3530 Unspecified macular degeneration: Secondary | ICD-10-CM | POA: Diagnosis present

## 2023-11-30 DIAGNOSIS — D638 Anemia in other chronic diseases classified elsewhere: Secondary | ICD-10-CM | POA: Diagnosis not present

## 2023-11-30 DIAGNOSIS — F419 Anxiety disorder, unspecified: Secondary | ICD-10-CM | POA: Diagnosis not present

## 2023-11-30 DIAGNOSIS — N401 Enlarged prostate with lower urinary tract symptoms: Secondary | ICD-10-CM | POA: Diagnosis not present

## 2023-11-30 DIAGNOSIS — I5022 Chronic systolic (congestive) heart failure: Secondary | ICD-10-CM | POA: Diagnosis present

## 2023-11-30 DIAGNOSIS — Z961 Presence of intraocular lens: Secondary | ICD-10-CM | POA: Diagnosis not present

## 2023-11-30 DIAGNOSIS — N1330 Unspecified hydronephrosis: Secondary | ICD-10-CM | POA: Diagnosis not present

## 2023-11-30 DIAGNOSIS — Z6832 Body mass index (BMI) 32.0-32.9, adult: Secondary | ICD-10-CM | POA: Diagnosis not present

## 2023-11-30 DIAGNOSIS — N179 Acute kidney failure, unspecified: Secondary | ICD-10-CM | POA: Diagnosis present

## 2023-11-30 DIAGNOSIS — R338 Other retention of urine: Secondary | ICD-10-CM | POA: Diagnosis present

## 2023-11-30 DIAGNOSIS — I5020 Unspecified systolic (congestive) heart failure: Secondary | ICD-10-CM | POA: Diagnosis not present

## 2023-11-30 DIAGNOSIS — B377 Candidal sepsis: Secondary | ICD-10-CM | POA: Diagnosis present

## 2023-11-30 DIAGNOSIS — B3749 Other urogenital candidiasis: Secondary | ICD-10-CM | POA: Diagnosis not present

## 2023-11-30 DIAGNOSIS — I420 Dilated cardiomyopathy: Secondary | ICD-10-CM | POA: Diagnosis present

## 2023-11-30 DIAGNOSIS — E669 Obesity, unspecified: Secondary | ICD-10-CM | POA: Diagnosis not present

## 2023-11-30 DIAGNOSIS — I130 Hypertensive heart and chronic kidney disease with heart failure and stage 1 through stage 4 chronic kidney disease, or unspecified chronic kidney disease: Secondary | ICD-10-CM | POA: Diagnosis not present

## 2023-11-30 DIAGNOSIS — K5730 Diverticulosis of large intestine without perforation or abscess without bleeding: Secondary | ICD-10-CM | POA: Diagnosis not present

## 2023-11-30 DIAGNOSIS — I10 Essential (primary) hypertension: Secondary | ICD-10-CM | POA: Diagnosis not present

## 2023-11-30 DIAGNOSIS — R339 Retention of urine, unspecified: Secondary | ICD-10-CM | POA: Insufficient documentation

## 2023-11-30 DIAGNOSIS — N1831 Chronic kidney disease, stage 3a: Secondary | ICD-10-CM | POA: Diagnosis present

## 2023-11-30 DIAGNOSIS — N136 Pyonephrosis: Secondary | ICD-10-CM | POA: Diagnosis present

## 2023-11-30 DIAGNOSIS — E7800 Pure hypercholesterolemia, unspecified: Secondary | ICD-10-CM | POA: Diagnosis not present

## 2023-11-30 DIAGNOSIS — B3789 Other sites of candidiasis: Secondary | ICD-10-CM | POA: Diagnosis not present

## 2023-11-30 DIAGNOSIS — Z85828 Personal history of other malignant neoplasm of skin: Secondary | ICD-10-CM | POA: Diagnosis not present

## 2023-11-30 DIAGNOSIS — R109 Unspecified abdominal pain: Secondary | ICD-10-CM | POA: Diagnosis not present

## 2023-11-30 DIAGNOSIS — D631 Anemia in chronic kidney disease: Secondary | ICD-10-CM | POA: Diagnosis present

## 2023-11-30 DIAGNOSIS — N138 Other obstructive and reflux uropathy: Secondary | ICD-10-CM | POA: Diagnosis present

## 2023-11-30 DIAGNOSIS — N289 Disorder of kidney and ureter, unspecified: Secondary | ICD-10-CM | POA: Diagnosis not present

## 2023-11-30 DIAGNOSIS — F0394 Unspecified dementia, unspecified severity, with anxiety: Secondary | ICD-10-CM | POA: Diagnosis not present

## 2023-11-30 DIAGNOSIS — Z79899 Other long term (current) drug therapy: Secondary | ICD-10-CM | POA: Diagnosis not present

## 2023-11-30 DIAGNOSIS — E785 Hyperlipidemia, unspecified: Secondary | ICD-10-CM | POA: Diagnosis present

## 2023-11-30 DIAGNOSIS — R319 Hematuria, unspecified: Secondary | ICD-10-CM | POA: Diagnosis present

## 2023-11-30 DIAGNOSIS — B488 Other specified mycoses: Secondary | ICD-10-CM | POA: Diagnosis not present

## 2023-11-30 LAB — URINALYSIS, ROUTINE W REFLEX MICROSCOPIC
Bacteria, UA: NONE SEEN
Bilirubin Urine: NEGATIVE
Glucose, UA: NEGATIVE mg/dL
Hgb urine dipstick: NEGATIVE
Ketones, ur: NEGATIVE mg/dL
Nitrite: NEGATIVE
Protein, ur: NEGATIVE mg/dL
Specific Gravity, Urine: 1.006 (ref 1.005–1.030)
pH: 6 (ref 5.0–8.0)

## 2023-11-30 LAB — COMPREHENSIVE METABOLIC PANEL WITH GFR
ALT: 5 U/L (ref 0–44)
AST: 11 U/L — ABNORMAL LOW (ref 15–41)
Albumin: 3.6 g/dL (ref 3.5–5.0)
Alkaline Phosphatase: 80 U/L (ref 38–126)
Anion gap: 8 (ref 5–15)
BUN: 31 mg/dL — ABNORMAL HIGH (ref 8–23)
CO2: 25 mmol/L (ref 22–32)
Calcium: 8.9 mg/dL (ref 8.9–10.3)
Chloride: 106 mmol/L (ref 98–111)
Creatinine, Ser: 1.78 mg/dL — ABNORMAL HIGH (ref 0.61–1.24)
GFR, Estimated: 39 mL/min — ABNORMAL LOW (ref >60)
Glucose, Bld: 81 mg/dL (ref 70–99)
Potassium: 5 mmol/L (ref 3.5–5.1)
Sodium: 139 mmol/L (ref 135–145)
Total Bilirubin: <0.2 mg/dL (ref 0.0–1.2)
Total Protein: 7.2 g/dL (ref 6.5–8.1)

## 2023-11-30 LAB — CBC WITH DIFFERENTIAL/PLATELET
Abs Immature Granulocytes: 0.03 10*3/uL (ref 0.00–0.07)
Basophils Absolute: 0.1 10*3/uL (ref 0.0–0.1)
Basophils Relative: 2 %
Eosinophils Absolute: 0.2 10*3/uL (ref 0.0–0.5)
Eosinophils Relative: 3 %
HCT: 32.7 % — ABNORMAL LOW (ref 39.0–52.0)
Hemoglobin: 9.6 g/dL — ABNORMAL LOW (ref 13.0–17.0)
Immature Granulocytes: 1 %
Lymphocytes Relative: 34 %
Lymphs Abs: 2 10*3/uL (ref 0.7–4.0)
MCH: 28.8 pg (ref 26.0–34.0)
MCHC: 29.4 g/dL — ABNORMAL LOW (ref 30.0–36.0)
MCV: 98.2 fL (ref 80.0–100.0)
Monocytes Absolute: 0.5 10*3/uL (ref 0.1–1.0)
Monocytes Relative: 9 %
Neutro Abs: 3 10*3/uL (ref 1.7–7.7)
Neutrophils Relative %: 51 %
Platelets: 177 10*3/uL (ref 150–400)
RBC: 3.33 MIL/uL — ABNORMAL LOW (ref 4.22–5.81)
RDW: 14.5 % (ref 11.5–15.5)
WBC: 5.8 10*3/uL (ref 4.0–10.5)
nRBC: 0 % (ref 0.0–0.2)

## 2023-11-30 LAB — PROTEIN / CREATININE RATIO, URINE
Creatinine, Urine: 35 mg/dL (ref 20–320)
Protein/Creat Ratio: 314 mg/g{creat} — ABNORMAL HIGH (ref 25–148)
Protein/Creatinine Ratio: 0.314 mg/mg{creat} — ABNORMAL HIGH (ref 0.025–0.148)
Total Protein, Urine: 11 mg/dL (ref 5–25)

## 2023-11-30 LAB — CYSTATIN C WITH GLOMERULAR FILTRATION RATE, ESTIMATED (EGFR)
CYSTATIN C: 2.26 mg/L — ABNORMAL HIGH (ref 0.52–1.16)
eGFR: 25 mL/min/{1.73_m2} — ABNORMAL LOW (ref >=60)

## 2023-11-30 MED ORDER — LIDOCAINE HCL URETHRAL/MUCOSAL 2 % EX GEL: 1.0000 | Freq: Once | CUTANEOUS | Status: DC

## 2023-11-30 MED ORDER — SODIUM CHLORIDE 0.9 % IV BOLUS
1000.0000 mL | Freq: Once | INTRAVENOUS | Status: AC
  Administered 2023-11-30: 1000 mL via INTRAVENOUS

## 2023-11-30 MED ORDER — CHLORHEXIDINE GLUCONATE CLOTH 2 % EX PADS: 6.0000 | MEDICATED_PAD | Freq: Every day | CUTANEOUS | Status: DC

## 2023-11-30 NOTE — ED Notes (Signed)
 Urology cart at bedside, pt transferred to room 47 for urologist to do catheter insertion.

## 2023-11-30 NOTE — ED Triage Notes (Signed)
 Pt states he has not been able to urinate well recently, his urine constantly trickles out. Pt states he is being seen for same and PCP sent to ED for possible catheter placement.

## 2023-11-30 NOTE — Consult Note (Signed)
 Reason for Consult: Urinary Retention / Very Large Prostate, Colovesical Fistula  Referring Physician: Rolan Quale DO  Calvin Escobar is an 73 y.o. male.   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 Follows Dr. Carolee. ON tamsulosin  + finasteride started 11/2023. Prostate vol with significant medain by CT 05/2023. Normal PVR's 2024 but worseend 2025. Had chronic catheter for awhile but demanded it out. Cysto today also with short segment pendulous stricture.   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 IN process of eval for repair by general surgery. Colonoscopy pending with Dr. Legrand of GI 12/23/23.  PMH sig for dementia, OSA/CPAP.  LIves at home with wife who provides considerable care for him. Retired from [employer].   Today  Calvin Escobar  is seen as emergent consult for frank retention. PVR >511mL and only dribbling urine NSG unable to place catheter. He had one in just 3 weeks ago and demanded it be removed.   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 History of GI bleeding (07/2023). Currently on ferrous sulfate  supplementation.   DIAGNOSTIC ASSESSMENT: ? Primary etiology (70-80% probability): Anemia of chronic disease/inflammation ? Secondary considerations: Iron deficiency component (elevated RD   DCM  (dilated cardiomyopathy) (HCC)    nonischemic with normal coronary arteries at cath 06/2019.  EF 35 to 40% on echo 04/2021   Dementia (HCC)    Depression    Diarrhea 05/13/2023   E coli bacteremia 05/14/2023   High anion gap metabolic acidosis 07/13/2023   History of Clostridioides difficile colitis 08/15/2023   Associated with colovesical fistula? 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  Endoscopy esophageal stricture 2016     Septic shock from UTI 06/02/2023   Severe sepsis (HCC) 06/03/2023   SKIN CANCER, HX OF 05/10/2007   Facial left check and forehead and r shoulder  F/w derm   Syncope 05/13/2023   Thrombocytopenia (HCC) 06/02/2023   Lab Results      Component    Value    Date           PLT    301    07/19/2023           PLT    324    07/18/2023           PLT    268    07/16/2023           PLT    207    07/14/2023           PLT    228    07/13/2023             Lab Results  Component    Value    Date           ESRSEDRATE    60 (H)    05/08/2023     No results found for: CRP          Lab Results      Component    Value       Urinary catheter complication (HCC) 06/24/2023   Urinary dribbling 03/14/2022   Urinary incontinence 11/21/2022      Urinary Incontinence: They are wearing disposable diapers daily due to urinary incontinence, primarily nocturnal, and are currently on Tamsulosin  (Flomax ). We will refer them to Urology for further evaluation and potential treatment options and continue Tamsulosin  until they are seen by Urology.     Urinary tract infection without hematuria 06/02/2023   UTI (urinary tract infection) 07/01/2023    Past Surgical History:  Procedure Laterality Date   COLONOSCOPY     RIGHT/LEFT HEART CATH AND CORONARY ANGIOGRAPHY N/A 07/03/2019   Procedure: RIGHT/LEFT HEART CATH AND  CORONARY ANGIOGRAPHY;  Surgeon: Cherrie Toribio SAUNDERS, MD;  Location: [facility];  Service: Cardiovascular;  Laterality: N/A;    Family History  Problem Relation Age of Onset   Hypertension Mother    Cancer Father    Bone cancer Sister     Social History:  reports that he has never smoked. He has never used smokeless tobacco. He reports that he does not currently use alcohol after a past usage of about 14.0 standard drinks of alcohol per week. He reports that he does not use drugs.  Allergies:  Allergies  Allergen Reactions   Albuterol  Palpitations    Supraventricular Tachycardia     Medications: I have reviewed the patient's current medications.  Results for orders placed or performed during the hospital encounter of 11/30/23 (from the past 48 hours)  Urinalysis, Routine w reflex microscopic -Urine, Clean Catch     Status: Abnormal   Collection Time: 11/30/23  7:48 AM  Result Value Ref Range   Color, Urine STRAW (A) YELLOW   APPearance CLEAR CLEAR   Specific Gravity, Urine 1.006 1.005 - 1.030   pH 6.0 5.0 - 8.0   Glucose, UA NEGATIVE NEGATIVE mg/dL   Hgb urine dipstick NEGATIVE NEGATIVE   Bilirubin Urine NEGATIVE NEGATIVE   Ketones, ur NEGATIVE NEGATIVE mg/dL   Protein, ur NEGATIVE NEGATIVE mg/dL   Nitrite NEGATIVE NEGATIVE   Leukocytes,Ua LARGE (A) NEGATIVE   RBC / HPF 0-5 0 - 5 RBC/hpf   WBC, UA 21-50 0 - 5 WBC/hpf   Bacteria, UA NONE SEEN NONE SEEN   Squamous Epithelial / HPF 0-5 0 - 5 /HPF    Comment: Performed at [facility], [address] [place], [place] [address]  CBC with Differential     Status: Abnormal   Collection Time: 11/30/23  9:41 AM  Result Value Ref Range   WBC 5.8 4.0 - 10.5 K/uL   RBC 3.33 (L) 4.22 - 5.81 MIL/uL   Hemoglobin 9.6 (L) 13.0 - 17.0 g/dL   HCT 67.2 (L) 60.9 - 47.9 %   MCV 98.2 80.0 - 100.0 fL   MCH 28.8 26.0 - 34.0 pg   MCHC 29.4 (L) 30.0 - 36.0 g/dL   RDW 85.4 88.4 - 84.4 %   Platelets 177 150 - 400 K/uL   nRBC 0.0  0.0 - 0.2 %   Neutrophils Relative % 51 %   Neutro Abs 3.0 1.7 - 7.7 K/uL  Lymphocytes Relative 34 %   Lymphs Abs 2.0 0.7 - 4.0 K/uL   Monocytes Relative 9 %   Monocytes Absolute 0.5 0.1 - 1.0 K/uL   Eosinophils Relative 3 %   Eosinophils Absolute 0.2 0.0 - 0.5 K/uL   Basophils Relative 2 %   Basophils Absolute 0.1 0.0 - 0.1 K/uL   Immature Granulocytes 1 %   Abs Immature Granulocytes 0.03 0.00 - 0.07 K/uL    Comment: Performed at [facility], [address] [place], [place] [address]  Comprehensive metabolic panel     Status: Abnormal   Collection Time: 11/30/23  9:41 AM  Result Value Ref Range   Sodium 139 135 - 145 mmol/L   Potassium 5.0 3.5 - 5.1 mmol/L   Chloride 106 98 - 111 mmol/L   CO2 25 22 - 32 mmol/L   Glucose, Bld 81 70 - 99 mg/dL    Comment: Glucose reference range applies only to samples taken after fasting for at least 8 hours.   BUN 31 (H) 8 - 23 mg/dL   Creatinine, Ser 8.21 (H) 0.61 - 1.24 mg/dL   Calcium  8.9 8.9 - 10.3 mg/dL   Total Protein 7.2 6.5 - 8.1 g/dL   Albumin  3.6 3.5 - 5.0 g/dL   AST 11 (L) 15 - 41 U/L   ALT 5 0 - 44 U/L   Alkaline Phosphatase 80 38 - 126 U/L   Total Bilirubin <0.2 0.0 - 1.2 mg/dL   GFR, Estimated 39 (L) >60 mL/min    Comment: (NOTE) Calculated using the CKD-EPI Creatinine Equation (2021)    Anion gap 8 5 - 15    Comment: Performed at [facility], [address] [place], [place] [address]    No results found.  Review of Systems  Constitutional:  Negative for chills and fever.  Genitourinary:  Positive for difficulty urinating.  All other systems reviewed and are negative.  Blood pressure 138/79, pulse 63, temperature 97.7 F (36.5 C), temperature source Oral, resp. rate 17, height 6' (1.829 m), weight 108.9 kg, SpO2 100%. Physical Exam Vitals reviewed.  Constitutional:      Comments: Pleasant, but some mild dementia.  HENT:     Head: Normocephalic.   Eyes:     Pupils: Pupils are equal, round, and  reactive to light.    Cardiovascular:     Rate and Rhythm: Normal rate.  Pulmonary:     Effort: Pulmonary effort is normal.  Abdominal:     Comments: Significant trucnal obesity.   Genitourinary:    Comments: NO CVAT. Distal hypospdias noted.   Musculoskeletal:        General: Normal range of motion.     Cervical back: Normal range of motion.   Skin:    General: Skin is warm.   Neurological:     General: No focal deficit present.     Mental Status: He is alert.   Psychiatric:        Mood and Affect: Mood normal.    BEDSIDE CYSTO / URETHAL DILATION / CATHETER PLACEMENT:  Using asteptic technique penis prepped with iodine. Urethrosocpy performed with 55F flexible scope. Short segment high grade (59F) stricture in mid pendulous urethra. [name]038 zip sensor wire advanced across. 63F ballon silator advanced across stricture, dilated to 20atm for 90 seconds. Cysto then with resolved stricture, large prosate noted and trabeculated bladder. Sensor wire again advanced to level of bladder and 68F council catheter placed. 10mL water in balloon and immediate  efflux 2L non-foul urine.    Assessment/Plan:  Keep current catehter. Rec monthly changes at Urol office for now until at least CV fistula repaired. Likely indefinetely as he is not candidate for complex GU reconstruction. Has F/U with Dr. Carolee scheduled for 7/23. Can be changed at that time.     Calvin Escobar. 11/30/2023, 2:19 PM

## 2023-11-30 NOTE — ED Notes (Signed)
 Urology at bedside.

## 2023-11-30 NOTE — Discharge Instructions (Signed)
Follow up with your family doc and urologist in the office.  

## 2023-11-30 NOTE — ED Provider Notes (Signed)
 [place] EMERGENCY DEPARTMENT AT [facility] Provider Note   CSN: [id number] Arrival date & time: 11/30/23  9260     Patient presents with: Urinary Retention   Calvin Escobar is a 73 y.o. male.   73 yo M with a chief complaint of escalating creatinine.  The patient has been seen by his PCP recently.  It sounds like he had blood work done that resulted that was elevated and was told to come to the emergency department for evaluation.  He tells me that he has had trouble emptying his bladder since about March.  Has started wearing depends.  Says that usually he will not urinate much during the day but at night will have significant urinary output.  He denies loss of stool.  Denies loss of pectoral sensation.  Denies numbness or weakness to his legs.  Denies back pain.  Denies fevers.  Denies vomiting.          Prior to Admission medications   Medication Sig Start Date End Date Taking? Authorizing Provider  ARIPiprazole  (ABILIFY ) 2 MG tablet Take 1 tablet (2 mg total) by mouth in the morning. 08/22/23   [name], MD  busPIRone  (BUSPAR ) 10 MG tablet Take 1 tablet (10 mg total) by mouth 3 (three) times daily. 09/16/23   [name], MD  carvedilol  (COREG ) 12.5 MG tablet Take 12.5 mg by mouth 2 (two) times daily. 10/28/23   [provider]  ferrous sulfate  325 (65 FE) MG tablet Take 1 tablet (325 mg total) by mouth 2 (two) times daily with a meal. 07/19/23   Rai, Ripudeep K, MD  finasteride (PROSCAR) 5 MG tablet Take 5 mg by mouth daily. 11/06/23   [provider]  fluconazole  (DIFLUCAN ) 200 MG tablet Take 1 tablet (200 mg total) by mouth daily. Take 1 tablet on day one, then only half tablet daily until all tablets gone (15d total) 09/22/23   [name], MD  folic acid  (FOLVITE ) 1 MG tablet Take 1 mg by mouth daily. 10/21/23   [provider]  gabapentin  (NEURONTIN ) 100 MG capsule Take 1 capsule (100 mg total) by mouth 3 (three) times  daily. 09/16/23   [name], MD  losartan  (COZAAR ) 50 MG tablet Take 50 mg by mouth daily. 10/28/23   [provider]  melatonin 3 MG TABS tablet Take 1 tablet (3 mg total) by mouth at bedtime as needed (insomnia). 07/19/23   Rai, Nydia POUR, MD  midodrine  (PROAMATINE ) 5 MG tablet Take 1 tablet (5 mg total) by mouth 3 (three) times daily with meals. 06/11/23   Krishnan, Gokul, MD  mirtazapine  (REMERON ) 30 MG tablet Take 1 tablet (30 mg total) by mouth at bedtime. 09/16/23   [name], MD  pravastatin  (PRAVACHOL ) 20 MG tablet Take 1 tablet (20 mg total) by mouth at bedtime. 12/19/22   [name], MD  primidone  (MYSOLINE ) 50 MG tablet Take 1 tablet (50 mg total) by mouth 2 (two) times daily. 06/11/23   Krishnan, Gokul, MD  tamsulosin  (FLOMAX ) 0.4 MG CAPS capsule TAKE 1 CAPSULE BY MOUTH EVERY DAY 07/23/23   [name], MD  traZODone  (DESYREL ) 100 MG tablet Take 1 tablet (100 mg total) by mouth at bedtime. 07/19/23   Rai, Nydia POUR, MD    Allergies: Albuterol     Review of Systems  Updated Vital Signs BP 138/79 (BP Location: Right Arm)   Pulse 63   Temp 97.7 F (36.5 C) (Oral)  Resp 17   Ht 6' (1.829 m)   Wt 108.9 kg   SpO2 100%   BMI 32.55 kg/m   Physical Exam Vitals and nursing note reviewed.  Constitutional:      Appearance: He is well-developed.  HENT:     Head: Normocephalic and atraumatic.   Eyes:     Pupils: Pupils are equal, round, and reactive to light.   Neck:     Vascular: No JVD.   Cardiovascular:     Rate and Rhythm: Normal rate and regular rhythm.     Heart sounds: No murmur heard.    No friction rub. No gallop.  Pulmonary:     Effort: No respiratory distress.     Breath sounds: No wheezing.  Abdominal:     General: There is no distension.     Tenderness: There is no abdominal tenderness. There is no guarding or rebound.   Musculoskeletal:        General: Normal range of motion.     Cervical back: Normal range of motion and neck  supple.   Skin:    Coloration: Skin is not pale.     Findings: No rash.   Neurological:     Mental Status: He is alert and oriented to person, place, and time.   Psychiatric:        Behavior: Behavior normal.     (all labs ordered are listed, but only abnormal results are displayed) Labs Reviewed  URINALYSIS, ROUTINE W REFLEX MICROSCOPIC - Abnormal; Notable for the following components:      Result Value   Color, Urine STRAW (*)    Leukocytes,Ua LARGE (*)    All other components within normal limits  CBC WITH DIFFERENTIAL/PLATELET - Abnormal; Notable for the following components:   RBC 3.33 (*)    Hemoglobin 9.6 (*)    HCT 32.7 (*)    MCHC 29.4 (*)    All other components within normal limits  COMPREHENSIVE METABOLIC PANEL WITH GFR - Abnormal; Notable for the following components:   BUN 31 (*)    Creatinine, Ser 1.78 (*)    AST 11 (*)    GFR, Estimated 39 (*)    All other components within normal limits    EKG: None  Radiology: No results found.   BLADDER CATHETERIZATION  Date/Time: 11/30/2023 3:17 PM  Performed by: Emil Share, DO Authorized by: Emil Share, DO   Consent:    Consent obtained:  Verbal   Consent given by:  Patient   Risks, benefits, and alternatives were discussed: yes     Risks discussed:  False passage, infection, urethral injury, incomplete procedure and pain   Alternatives discussed:  No treatment Universal protocol:    Procedure explained and questions answered to patient or proxy's satisfaction: yes     Patient identity confirmed:  Verbally with patient Pre-procedure details:    Procedure purpose:  Therapeutic Anesthesia:    Anesthesia method:  Topical application Procedure details:    Provider performed due to:  Nurse unable to complete   Catheter insertion:  Indwelling   Catheter size:  18 Fr   Number of attempts:  5 or more   Urine characteristics:  Bloody Post-procedure details:    Procedure completion:  Procedure terminated  electively by provider    Medications Ordered in the ED  lidocaine  (XYLOCAINE ) 2 % jelly 1 Application (has no administration in time range)  lidocaine  (XYLOCAINE ) 2 % jelly 1 Application (has no administration in time range)  Chlorhexidine   Gluconate Cloth 2 % PADS 6 each (has no administration in time range)  sodium chloride  0.9 % bolus 1,000 mL (1,000 mLs Intravenous New Bag/Given 11/30/23 1013)                                    Medical Decision Making Amount and/or Complexity of Data Reviewed Labs: ordered.  Risk OTC drugs.   73 yo M with a chief complaints of an elevated creatinine.  Patient tells me he had trouble completely emptying his bladder for about 3 months.  He has seen his PCP multiple times.  Is awaiting referral to urology.  He had a higher creatinine checked recently and was told to come to the ED for Foley catheter consideration.  Will BladderScan here.  Bolus of IV fluids.  Recheck renal function.  It sounds like the patient has not been drinking much because he does not want to urinate as often.    Ultrasound with greater than 500 cc.  Will place Foley.  Unable to place Foley at bedside.  Multiple attempts made by nursing.  I attempted personally and was unable.  The coud cath team was contacted and they also attempted and failed.  I discussed the case with urology, Dr. Alvaro who will come and evaluate at bedside.  Urology able to place foley.  D/c home.   3:32 PM:  I have discussed the diagnosis/risks/treatment options with the patient.  Evaluation and diagnostic testing in the emergency department does not suggest an emergent condition requiring admission or immediate intervention beyond what has been performed at this time.  They will follow up with PCP. We also discussed returning to the ED immediately if new or worsening sx occur. We discussed the sx which are most concerning (e.g., sudden worsening pain, fever, inability to tolerate by mouth) that necessitate  immediate return. Medications administered to the patient during their visit and any new prescriptions provided to the patient are listed below.  Medications given during this visit Medications  lidocaine  (XYLOCAINE ) 2 % jelly 1 Application (has no administration in time range)  lidocaine  (XYLOCAINE ) 2 % jelly 1 Application (has no administration in time range)  Chlorhexidine  Gluconate Cloth 2 % PADS 6 each (has no administration in time range)  sodium chloride  0.9 % bolus 1,000 mL (1,000 mLs Intravenous New Bag/Given 11/30/23 1013)     The patient appears reasonably screen and/or stabilized for discharge and I doubt any other medical condition or other [facility] requiring further screening, evaluation, or treatment in the ED at this time prior to discharge.      Final diagnoses:  Urinary retention    ED Discharge Orders     None          Emil Share, DO 11/30/23 1532

## 2023-12-01 NOTE — Progress Notes (Signed)
 Calvin Escobar is 73 y.o. -year-old with CKD (GFR/EGFR trending down I.e. 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 Now with asymptomatic candidemia on recent blood cultures presumably residual / failed treatment(s)  with progression to bloodstream. Patient currently feels well and denies fever, chills, malaise, visual changes, or focal complaints. OBJECTIVE: Most recent GFR: 34 Most recent EGFR: 25 Recent blood culture(s) positive for Candida species Prior ultrasound: probable fungus ball, reportedly resolved Foley catheter in place Completed 14 days of fluconazole  for Candida UTI Vitals per patient: stable, afebrile at home ASSESSMENT: Asymptomatic candidemia in the context of possible persistent urinary source (prior fungus ball, Foley, CKD). High risk for invasive fungal infection and complications such as renal failure.  Fungemia not meeting Systemic Immune Response Syndrome criteria for sepsis but intractable and progressive. Requires urgent Infectious Disease evaluation, repeat blood and urine cultures, source control, and possible hospitalization for parenteral antifungal therapy and additional imaging. PLAN: Spoke directly with patient by phone. Explained the serious nature of candidemia, including risk of progression and end-organ complications (endocarditis, endophthalmitis, etc.), even in the absence of symptoms. Strongly recommended immediate evaluation in the Emergency Department for urgent ID consult and hospital admission. Patient declined ED visit tonight, but agreed to present to the ED by 8:00 AM Monday for prompt evaluation and management. Instructed to monitor for any new or worsening symptoms (fever, chills, shortness of breath, visual changes, confusion, pain, etc.), and to present immediately to the ED  if they develop. ED Coordination: This note is to inform the ED and ID consultants of the above findings, recent positive Candida blood cultures, and the need for prompt assessment, repeat cultures, source identification (including repeat imaging as indicated), and initiation of appropriate antifungal therapy. Care Coordination: Request ID consult and Urology input on arrival. Request serial blood and urine cultures. Recommend ophthalmology evaluation to screen for endophthalmitis. PROVIDER'S NOTE: Every effort was made to convey the urgency of the situation and the need for immediate evaluation. Patient verbalized understanding of risks and reasons for expedited care and agreed to present to the ED first thing in the morning. Patient was counseled to seek immediate care if symptoms arise overnight. Hand-off to ED: Patient at high risk for invasive candidiasis. Please prioritize ID evaluation, repeat cultures, imaging for persistent source (e.g., fungus ball, abscess), and initiate antifungal therapy per hospital protocol and ID recommendation. Patient's clinical stability may be misleading; candidemia is never benign in this setting.

## 2023-12-02 ENCOUNTER — Emergency Department (HOSPITAL_COMMUNITY)

## 2023-12-02 ENCOUNTER — Inpatient Hospital Stay (HOSPITAL_COMMUNITY)
Admission: EM | Admit: 2023-12-02 | Discharge: 2023-12-07 | DRG: 872 | Disposition: A | Discharge status: Home Health | Attending: Internal Medicine | Admitting: Internal Medicine

## 2023-12-02 ENCOUNTER — Encounter (HOSPITAL_COMMUNITY): Payer: Self-pay

## 2023-12-02 ENCOUNTER — Other Ambulatory Visit: Payer: Self-pay

## 2023-12-02 DIAGNOSIS — N35819 Other urethral stricture, male, unspecified site: Secondary | ICD-10-CM | POA: Diagnosis present

## 2023-12-02 DIAGNOSIS — Z66 Do not resuscitate: Secondary | ICD-10-CM | POA: Diagnosis present

## 2023-12-02 DIAGNOSIS — H02833 Dermatochalasis of right eye, unspecified eyelid: Secondary | ICD-10-CM | POA: Diagnosis present

## 2023-12-02 DIAGNOSIS — D638 Anemia in other chronic diseases classified elsewhere: Secondary | ICD-10-CM | POA: Diagnosis present

## 2023-12-02 DIAGNOSIS — N1330 Unspecified hydronephrosis: Secondary | ICD-10-CM | POA: Diagnosis not present

## 2023-12-02 DIAGNOSIS — Z8711 Personal history of peptic ulcer disease: Secondary | ICD-10-CM

## 2023-12-02 DIAGNOSIS — N139 Obstructive and reflux uropathy, unspecified: Secondary | ICD-10-CM | POA: Diagnosis present

## 2023-12-02 DIAGNOSIS — I77810 Thoracic aortic ectasia: Secondary | ICD-10-CM | POA: Diagnosis present

## 2023-12-02 DIAGNOSIS — Z85828 Personal history of other malignant neoplasm of skin: Secondary | ICD-10-CM

## 2023-12-02 DIAGNOSIS — I5022 Chronic systolic (congestive) heart failure: Secondary | ICD-10-CM | POA: Diagnosis present

## 2023-12-02 DIAGNOSIS — R319 Hematuria, unspecified: Secondary | ICD-10-CM | POA: Diagnosis present

## 2023-12-02 DIAGNOSIS — N321 Vesicointestinal fistula: Secondary | ICD-10-CM | POA: Diagnosis present

## 2023-12-02 DIAGNOSIS — Z87440 Personal history of urinary (tract) infections: Secondary | ICD-10-CM

## 2023-12-02 DIAGNOSIS — Z79899 Other long term (current) drug therapy: Secondary | ICD-10-CM

## 2023-12-02 DIAGNOSIS — Z8249 Family history of ischemic heart disease and other diseases of the circulatory system: Secondary | ICD-10-CM | POA: Diagnosis not present

## 2023-12-02 DIAGNOSIS — F0283 Dementia in other diseases classified elsewhere, unspecified severity, with mood disturbance: Secondary | ICD-10-CM | POA: Diagnosis present

## 2023-12-02 DIAGNOSIS — E7800 Pure hypercholesterolemia, unspecified: Secondary | ICD-10-CM

## 2023-12-02 DIAGNOSIS — F3130 Bipolar disorder, current episode depressed, mild or moderate severity, unspecified: Secondary | ICD-10-CM | POA: Diagnosis present

## 2023-12-02 DIAGNOSIS — Z6832 Body mass index (BMI) 32.0-32.9, adult: Secondary | ICD-10-CM | POA: Diagnosis not present

## 2023-12-02 DIAGNOSIS — G4733 Obstructive sleep apnea (adult) (pediatric): Secondary | ICD-10-CM | POA: Diagnosis present

## 2023-12-02 DIAGNOSIS — N179 Acute kidney failure, unspecified: Secondary | ICD-10-CM | POA: Diagnosis present

## 2023-12-02 DIAGNOSIS — F0394 Unspecified dementia, unspecified severity, with anxiety: Secondary | ICD-10-CM

## 2023-12-02 DIAGNOSIS — E669 Obesity, unspecified: Secondary | ICD-10-CM | POA: Diagnosis present

## 2023-12-02 DIAGNOSIS — Z961 Presence of intraocular lens: Secondary | ICD-10-CM | POA: Diagnosis present

## 2023-12-02 DIAGNOSIS — K5730 Diverticulosis of large intestine without perforation or abscess without bleeding: Secondary | ICD-10-CM | POA: Diagnosis present

## 2023-12-02 DIAGNOSIS — F319 Bipolar disorder, unspecified: Secondary | ICD-10-CM | POA: Diagnosis present

## 2023-12-02 DIAGNOSIS — R109 Unspecified abdominal pain: Secondary | ICD-10-CM | POA: Diagnosis not present

## 2023-12-02 DIAGNOSIS — B377 Candidal sepsis: Secondary | ICD-10-CM | POA: Diagnosis present

## 2023-12-02 DIAGNOSIS — H3530 Unspecified macular degeneration: Secondary | ICD-10-CM | POA: Diagnosis present

## 2023-12-02 DIAGNOSIS — N289 Disorder of kidney and ureter, unspecified: Secondary | ICD-10-CM | POA: Diagnosis not present

## 2023-12-02 DIAGNOSIS — D631 Anemia in chronic kidney disease: Secondary | ICD-10-CM | POA: Diagnosis present

## 2023-12-02 DIAGNOSIS — E785 Hyperlipidemia, unspecified: Secondary | ICD-10-CM | POA: Diagnosis present

## 2023-12-02 DIAGNOSIS — N138 Other obstructive and reflux uropathy: Secondary | ICD-10-CM | POA: Diagnosis present

## 2023-12-02 DIAGNOSIS — Z1621 Resistance to vancomycin: Secondary | ICD-10-CM | POA: Diagnosis present

## 2023-12-02 DIAGNOSIS — F0284 Dementia in other diseases classified elsewhere, unspecified severity, with anxiety: Secondary | ICD-10-CM | POA: Diagnosis present

## 2023-12-02 DIAGNOSIS — N400 Enlarged prostate without lower urinary tract symptoms: Secondary | ICD-10-CM | POA: Diagnosis present

## 2023-12-02 DIAGNOSIS — Z22338 Carrier of other streptococcus: Secondary | ICD-10-CM

## 2023-12-02 DIAGNOSIS — B3749 Other urogenital candidiasis: Secondary | ICD-10-CM | POA: Diagnosis not present

## 2023-12-02 DIAGNOSIS — N1831 Chronic kidney disease, stage 3a: Secondary | ICD-10-CM | POA: Diagnosis present

## 2023-12-02 DIAGNOSIS — B3789 Other sites of candidiasis: Secondary | ICD-10-CM | POA: Diagnosis not present

## 2023-12-02 DIAGNOSIS — K648 Other hemorrhoids: Secondary | ICD-10-CM | POA: Diagnosis present

## 2023-12-02 DIAGNOSIS — J42 Unspecified chronic bronchitis: Secondary | ICD-10-CM | POA: Diagnosis present

## 2023-12-02 DIAGNOSIS — F419 Anxiety disorder, unspecified: Secondary | ICD-10-CM

## 2023-12-02 DIAGNOSIS — I5020 Unspecified systolic (congestive) heart failure: Secondary | ICD-10-CM | POA: Diagnosis not present

## 2023-12-02 DIAGNOSIS — R338 Other retention of urine: Secondary | ICD-10-CM | POA: Diagnosis present

## 2023-12-02 DIAGNOSIS — H18419 Arcus senilis, unspecified eye: Secondary | ICD-10-CM | POA: Diagnosis present

## 2023-12-02 DIAGNOSIS — I130 Hypertensive heart and chronic kidney disease with heart failure and stage 1 through stage 4 chronic kidney disease, or unspecified chronic kidney disease: Secondary | ICD-10-CM | POA: Diagnosis not present

## 2023-12-02 DIAGNOSIS — Z888 Allergy status to other drugs, medicaments and biological substances status: Secondary | ICD-10-CM

## 2023-12-02 DIAGNOSIS — I420 Dilated cardiomyopathy: Secondary | ICD-10-CM | POA: Diagnosis present

## 2023-12-02 DIAGNOSIS — N390 Urinary tract infection, site not specified: Secondary | ICD-10-CM | POA: Diagnosis not present

## 2023-12-02 DIAGNOSIS — Z8719 Personal history of other diseases of the digestive system: Secondary | ICD-10-CM

## 2023-12-02 DIAGNOSIS — F0390 Unspecified dementia without behavioral disturbance: Secondary | ICD-10-CM | POA: Diagnosis present

## 2023-12-02 DIAGNOSIS — N136 Pyonephrosis: Secondary | ICD-10-CM | POA: Diagnosis present

## 2023-12-02 DIAGNOSIS — H02836 Dermatochalasis of left eye, unspecified eyelid: Secondary | ICD-10-CM | POA: Diagnosis present

## 2023-12-02 DIAGNOSIS — N401 Enlarged prostate with lower urinary tract symptoms: Secondary | ICD-10-CM | POA: Diagnosis present

## 2023-12-02 DIAGNOSIS — N189 Chronic kidney disease, unspecified: Secondary | ICD-10-CM | POA: Diagnosis present

## 2023-12-02 DIAGNOSIS — B952 Enterococcus as the cause of diseases classified elsewhere: Secondary | ICD-10-CM | POA: Diagnosis present

## 2023-12-02 DIAGNOSIS — Z8619 Personal history of other infectious and parasitic diseases: Secondary | ICD-10-CM

## 2023-12-02 HISTORY — DX: Anxiety disorder, unspecified: F41.9

## 2023-12-02 HISTORY — DX: Other urogenital candidiasis: B37.49

## 2023-12-02 HISTORY — DX: Candidal sepsis: B37.7

## 2023-12-02 HISTORY — DX: Hematuria, unspecified: R31.9

## 2023-12-02 LAB — URINALYSIS, ROUTINE W REFLEX MICROSCOPIC

## 2023-12-02 LAB — CBC
HCT: 34.7 % — ABNORMAL LOW (ref 39.0–52.0)
Hemoglobin: 10.7 g/dL — ABNORMAL LOW (ref 13.0–17.0)
MCH: 28.7 pg (ref 26.0–34.0)
MCHC: 30.8 g/dL (ref 30.0–36.0)
MCV: 93 fL (ref 80.0–100.0)
Platelets: 200 10*3/uL (ref 150–400)
RBC: 3.73 MIL/uL — ABNORMAL LOW (ref 4.22–5.81)
RDW: 14.6 % (ref 11.5–15.5)
WBC: 8.5 10*3/uL (ref 4.0–10.5)
nRBC: 0 % (ref 0.0–0.2)

## 2023-12-02 LAB — URINALYSIS, MICROSCOPIC (REFLEX): RBC / HPF: >50 RBC/hpf (ref 0–5)

## 2023-12-02 LAB — BASIC METABOLIC PANEL WITH GFR
Anion gap: 8 (ref 5–15)
BUN: 29 mg/dL — ABNORMAL HIGH (ref 8–23)
CO2: 22 mmol/L (ref 22–32)
Calcium: 8.9 mg/dL (ref 8.9–10.3)
Chloride: 107 mmol/L (ref 98–111)
Creatinine, Ser: 1.88 mg/dL — ABNORMAL HIGH (ref 0.61–1.24)
GFR, Estimated: 37 mL/min — ABNORMAL LOW (ref >60)
Glucose, Bld: 117 mg/dL — ABNORMAL HIGH (ref 70–99)
Potassium: 4.2 mmol/L (ref 3.5–5.1)
Sodium: 137 mmol/L (ref 135–145)

## 2023-12-02 LAB — I-STAT CG4 LACTIC ACID, ED
Lactic Acid, Venous: 0.7 mmol/L (ref 0.5–1.9)
Lactic Acid, Venous: 0.6 mmol/L (ref 0.5–1.9)

## 2023-12-02 LAB — C-REACTIVE PROTEIN: CRP: 5.9 mg/dL — ABNORMAL HIGH (ref <1.0)

## 2023-12-02 MED ORDER — TAMSULOSIN HCL 0.4 MG PO CAPS
0.4000 mg | ORAL_CAPSULE | Freq: Every day | ORAL | Status: DC
  Administered 2023-12-02 – 2023-12-07 (×6): 0.4 mg via ORAL
  Filled 2023-12-02 (×7): qty 1

## 2023-12-02 MED ORDER — SODIUM CHLORIDE 0.9 % IV SOLN: INTRAVENOUS | Status: DC

## 2023-12-02 MED ORDER — FINASTERIDE 5 MG PO TABS
5.0000 mg | ORAL_TABLET | Freq: Every day | ORAL | Status: DC
Start: 2023-12-02 — End: 2023-12-07
  Administered 2023-12-02 – 2023-12-07 (×6): 5 mg via ORAL
  Filled 2023-12-02 (×6): qty 1

## 2023-12-02 MED ORDER — SENNOSIDES-DOCUSATE SODIUM 8.6-50 MG PO TABS: 1.0000 | ORAL_TABLET | Freq: Two times a day (BID) | ORAL | Status: DC

## 2023-12-02 MED ORDER — ACETAMINOPHEN 650 MG RE SUPP: 650.0000 mg | Freq: Four times a day (QID) | RECTAL | Status: DC | PRN

## 2023-12-02 MED ORDER — PRAVASTATIN SODIUM 40 MG PO TABS
20.0000 mg | ORAL_TABLET | Freq: Every day | ORAL | Status: DC
  Administered 2023-12-02 – 2023-12-06 (×5): 20 mg via ORAL
  Filled 2023-12-02 (×5): qty 1

## 2023-12-02 MED ORDER — ARIPIPRAZOLE 2 MG PO TABS
2.0000 mg | ORAL_TABLET | Freq: Every morning | ORAL | Status: DC
  Administered 2023-12-03 – 2023-12-07 (×5): 2 mg via ORAL
  Filled 2023-12-02 (×5): qty 1

## 2023-12-02 MED ORDER — SODIUM CHLORIDE 0.9 % IV SOLN
100.0000 mg | INTRAVENOUS | Status: DC
  Administered 2023-12-02 – 2023-12-05 (×4): 100 mg via INTRAVENOUS
  Filled 2023-12-02 (×5): qty 5

## 2023-12-02 MED ORDER — MIRTAZAPINE 15 MG PO TABS
30.0000 mg | ORAL_TABLET | Freq: Every day | ORAL | Status: DC
  Administered 2023-12-02 – 2023-12-06 (×5): 30 mg via ORAL
  Filled 2023-12-02 (×5): qty 2

## 2023-12-02 MED ORDER — CHLORHEXIDINE GLUCONATE CLOTH 2 % EX PADS
6.0000 | MEDICATED_PAD | Freq: Every day | CUTANEOUS | Status: DC
  Administered 2023-12-03 – 2023-12-07 (×5): 6 via TOPICAL

## 2023-12-02 MED ORDER — CARVEDILOL 12.5 MG PO TABS
12.5000 mg | ORAL_TABLET | Freq: Two times a day (BID) | ORAL | Status: DC
  Administered 2023-12-02 – 2023-12-07 (×10): 12.5 mg via ORAL
  Filled 2023-12-02 (×10): qty 1

## 2023-12-02 MED ORDER — SODIUM CHLORIDE 0.9% FLUSH
3.0000 mL | Freq: Two times a day (BID) | INTRAVENOUS | Status: DC
  Administered 2023-12-02 – 2023-12-07 (×9): 3 mL via INTRAVENOUS

## 2023-12-02 MED ORDER — VENLAFAXINE HCL ER 150 MG PO CP24
150.0000 mg | ORAL_CAPSULE | Freq: Every day | ORAL | Status: DC
  Administered 2023-12-02 – 2023-12-07 (×6): 150 mg via ORAL
  Filled 2023-12-02 (×6): qty 1

## 2023-12-02 MED ORDER — MELATONIN 3 MG PO TABS
3.0000 mg | ORAL_TABLET | Freq: Every evening | ORAL | Status: DC | PRN
  Administered 2023-12-02 – 2023-12-03 (×2): 3 mg via ORAL
  Filled 2023-12-02 (×2): qty 1

## 2023-12-02 MED ORDER — SENNOSIDES-DOCUSATE SODIUM 8.6-50 MG PO TABS
1.0000 | ORAL_TABLET | Freq: Two times a day (BID) | ORAL | Status: DC
  Administered 2023-12-02 – 2023-12-07 (×8): 1 via ORAL
  Filled 2023-12-02 (×10): qty 1

## 2023-12-02 MED ORDER — IOHEXOL 350 MG/ML SOLN
75.0000 mL | Freq: Once | INTRAVENOUS | Status: AC | PRN
  Administered 2023-12-02: 75 mL via INTRAVENOUS

## 2023-12-02 MED ORDER — FOLIC ACID 1 MG PO TABS
1.0000 mg | ORAL_TABLET | Freq: Every day | ORAL | Status: DC
Start: 2023-12-02 — End: 2023-12-07
  Administered 2023-12-02 – 2023-12-07 (×6): 1 mg via ORAL
  Filled 2023-12-02 (×6): qty 1

## 2023-12-02 MED ORDER — BUSPIRONE HCL 10 MG PO TABS
10.0000 mg | ORAL_TABLET | Freq: Three times a day (TID) | ORAL | Status: DC
  Administered 2023-12-02 – 2023-12-07 (×15): 10 mg via ORAL
  Filled 2023-12-02 (×15): qty 1

## 2023-12-02 MED ORDER — GABAPENTIN 100 MG PO CAPS
100.0000 mg | ORAL_CAPSULE | Freq: Three times a day (TID) | ORAL | Status: DC
  Administered 2023-12-02 – 2023-12-07 (×15): 100 mg via ORAL
  Filled 2023-12-02 (×15): qty 1

## 2023-12-02 MED ORDER — ACETAMINOPHEN 325 MG PO TABS
650.0000 mg | ORAL_TABLET | Freq: Four times a day (QID) | ORAL | Status: DC | PRN
Start: 2023-12-02 — End: 2023-12-07

## 2023-12-02 NOTE — Plan of Care (Signed)

## 2023-12-02 NOTE — ED Triage Notes (Signed)
 Pt coming in from home . Pt reports that the doctor called him and told him to come in after a blood test came back abnormal. Pt has foley catheter in  draining bloody urine.

## 2023-12-02 NOTE — ED Provider Notes (Signed)
 [place] EMERGENCY DEPARTMENT AT [facility] Provider Note   CSN: [id number] Arrival date & time: 12/02/23  9488     Patient presents with: Hematuria and Abnormal Lab   Calvin Escobar is a 73 y.o. male with past medical history significant for bipolar disorder, chronic bronchitis, hyperlipidemia, heart failure, CKD who presents with concern for ongoing hematuria with recent blood culture positive for candidemia.  He was called by urology who recommended that he return to the emergency department.  Concern for fungal ball in his urinary tract, they had requested repeat blood cultures, urine cultures, ID consult and urology consult as well as some abdominal imaging.  Patient reports no significant change in his symptoms since recent discharge from the ED after coud catheter placement.  Reports still having some bloody urine output.    Hematuria  Abnormal Lab      Prior to Admission medications   Medication Sig Start Date End Date Taking? Authorizing Provider  ARIPiprazole  (ABILIFY ) 2 MG tablet Take 1 tablet (2 mg total) by mouth in the morning. 08/22/23   [name], MD  busPIRone  (BUSPAR ) 10 MG tablet Take 1 tablet (10 mg total) by mouth 3 (three) times daily. 09/16/23   [name], MD  carvedilol  (COREG ) 12.5 MG tablet Take 12.5 mg by mouth 2 (two) times daily. 10/28/23   [provider]  ferrous sulfate  325 (65 FE) MG tablet Take 1 tablet (325 mg total) by mouth 2 (two) times daily with a meal. 07/19/23   Rai, Ripudeep K, MD  finasteride (PROSCAR) 5 MG tablet Take 5 mg by mouth daily. 11/06/23   [provider]  fluconazole  (DIFLUCAN ) 200 MG tablet Take 1 tablet (200 mg total) by mouth daily. Take 1 tablet on day one, then only half tablet daily until all tablets gone (15d total) 09/22/23   [name], MD  folic acid  (FOLVITE ) 1 MG tablet Take 1 mg by mouth daily. 10/21/23   [provider]  gabapentin  (NEURONTIN ) 100 MG capsule  Take 1 capsule (100 mg total) by mouth 3 (three) times daily. 09/16/23   [name], MD  losartan  (COZAAR ) 50 MG tablet Take 50 mg by mouth daily. 10/28/23   [provider]  melatonin 3 MG TABS tablet Take 1 tablet (3 mg total) by mouth at bedtime as needed (insomnia). 07/19/23   Rai, Nydia POUR, MD  midodrine  (PROAMATINE ) 5 MG tablet Take 1 tablet (5 mg total) by mouth 3 (three) times daily with meals. 06/11/23   Krishnan, Gokul, MD  mirtazapine  (REMERON ) 30 MG tablet Take 1 tablet (30 mg total) by mouth at bedtime. 09/16/23   [name], MD  pravastatin  (PRAVACHOL ) 20 MG tablet Take 1 tablet (20 mg total) by mouth at bedtime. 12/19/22   [name], MD  primidone  (MYSOLINE ) 50 MG tablet Take 1 tablet (50 mg total) by mouth 2 (two) times daily. 06/11/23   Krishnan, Gokul, MD  tamsulosin  (FLOMAX ) 0.4 MG CAPS capsule TAKE 1 CAPSULE BY MOUTH EVERY DAY 07/23/23   [name], MD  traZODone  (DESYREL ) 100 MG tablet Take 1 tablet (100 mg total) by mouth at bedtime. 07/19/23   Rai, Nydia POUR, MD    Allergies: Albuterol     Review of Systems  Genitourinary:  Positive for hematuria.  All other systems reviewed and are negative.   Updated Vital Signs BP (!) 137/91 (BP Location: Right Arm)   Pulse 84   Temp 98.2 F (36.8  C) (Oral)   Resp 16   Ht 6' (1.829 m)   Wt 108 kg   SpO2 100%   BMI 32.29 kg/m   Physical Exam Vitals and nursing note reviewed.  Constitutional:      General: He is not in acute distress.    Appearance: Normal appearance.  HENT:     Head: Normocephalic and atraumatic.   Eyes:     General:        Right eye: No discharge.        Left eye: No discharge.    Cardiovascular:     Rate and Rhythm: Regular rhythm. Tachycardia present.     Heart sounds: No murmur heard.    No friction rub. No gallop.  Pulmonary:     Effort: Pulmonary effort is normal.     Breath sounds: Normal breath sounds.  Abdominal:     General: Bowel sounds are normal.      Palpations: Abdomen is soft.     Comments: No significant tenderness palpation of the abdomen, suprapubic region   Skin:    General: Skin is warm and dry.     Capillary Refill: Capillary refill takes less than 2 seconds.   Neurological:     Mental Status: He is alert and oriented to person, place, and time.   Psychiatric:        Mood and Affect: Mood normal.        Behavior: Behavior normal.     (all labs ordered are listed, but only abnormal results are displayed) Labs Reviewed  URINALYSIS, ROUTINE W REFLEX MICROSCOPIC - Abnormal; Notable for the following components:      Result Value   Color, Urine RED (*)    APPearance TURBID (*)    Glucose, UA   (*)    Value: TEST NOT REPORTED DUE TO COLOR INTERFERENCE OF URINE PIGMENT   Hgb urine dipstick   (*)    Value: TEST NOT REPORTED DUE TO COLOR INTERFERENCE OF URINE PIGMENT   Bilirubin Urine   (*)    Value: TEST NOT REPORTED DUE TO COLOR INTERFERENCE OF URINE PIGMENT   Ketones, ur   (*)    Value: TEST NOT REPORTED DUE TO COLOR INTERFERENCE OF URINE PIGMENT   Protein, ur   (*)    Value: TEST NOT REPORTED DUE TO COLOR INTERFERENCE OF URINE PIGMENT   Nitrite   (*)    Value: TEST NOT REPORTED DUE TO COLOR INTERFERENCE OF URINE PIGMENT   Leukocytes,Ua   (*)    Value: TEST NOT REPORTED DUE TO COLOR INTERFERENCE OF URINE PIGMENT   All other components within normal limits  BASIC METABOLIC PANEL WITH GFR - Abnormal; Notable for the following components:   Glucose, Bld 117 (*)    BUN 29 (*)    Creatinine, Ser 1.88 (*)    GFR, Estimated 37 (*)    All other components within normal limits  CBC - Abnormal; Notable for the following components:   RBC 3.73 (*)    Hemoglobin 10.7 (*)    HCT 34.7 (*)    All other components within normal limits  URINALYSIS, MICROSCOPIC (REFLEX) - Abnormal; Notable for the following components:   Bacteria, UA RARE (*)    All other components within normal limits  CULTURE, BLOOD (ROUTINE X 2)  CULTURE,  BLOOD (ROUTINE X 2)  URINE CULTURE  FUNGUS CULTURE, BLOOD  I-STAT CG4 LACTIC ACID, ED  I-STAT CG4 LACTIC ACID, ED    EKG: None  Radiology: CT ABDOMEN PELVIS W CONTRAST Result Date: 12/02/2023 CLINICAL DATA:  Abdominal pain, hematuria. EXAM: CT ABDOMEN AND PELVIS WITH CONTRAST TECHNIQUE: Multidetector CT imaging of the abdomen and pelvis was performed using the standard protocol following bolus administration of intravenous contrast. RADIATION DOSE REDUCTION: This exam was performed according to the departmental dose-optimization program which includes automated exposure control, adjustment of the mA and/or kV according to patient size and/or use of iterative reconstruction technique. CONTRAST:  75mL OMNIPAQUE  IOHEXOL  350 MG/ML SOLN COMPARISON:  06/02/2023. FINDINGS: Lower chest: No acute findings. Heart is at the upper limits of normal in size with left ventricular dilatation. No pericardial or pleural effusion. Distal esophagus is grossly unremarkable. Hepatobiliary: Liver and gallbladder are unremarkable. No biliary ductal dilatation. Pancreas: Negative. Spleen: Negative. Adrenals/Urinary Tract: Scarring in the right kidney. Low-attenuation lesions in the kidneys. No specific follow-up necessary. Left renal edema with mild left hydronephrosis which appears to be secondary to marked bladder wall thickening. Right ureter is decompressed. Foley catheter and air are seen in the bladder. There is haziness and fluid surrounding the bladder. Stomach/Bowel: Stomach, small bowel, appendix and majority of the colon are unremarkable. There appears to be a low-attenuation tract extending from the rectosigmoid colon to the posterior margin of the bladder (3/67-70). Vascular/Lymphatic: Atherosclerotic calcification of the aorta. No pathologically enlarged lymph nodes. Reproductive: Prostate is visualized. Other: No free fluid.  Mesenteries and peritoneum are unremarkable. Musculoskeletal: Degenerative changes in the  spine. IMPRESSION: 1. Marked bladder wall thickening with surrounding edema and fluid, indicative of severe cystitis. Associated mild left hydronephrosis. 2. Suspected tract from the sigmoid colon to the posterior bladder wall, worrisome for a chronic colovesical fistula. 3.  Aortic atherosclerosis (ICD10-I70.0). Electronically Signed   By: Newell Eke M.D.   On: 12/02/2023 09:00     Procedures   Medications Ordered in the ED  micafungin (MYCAMINE) 100 mg in sodium chloride  0.9 % 100 mL IVPB (has no administration in time range)  sodium chloride  flush (NS) 0.9 % injection 3 mL (has no administration in time range)  acetaminophen  (TYLENOL ) tablet 650 mg (has no administration in time range)    Or  acetaminophen  (TYLENOL ) suppository 650 mg (has no administration in time range)  iohexol  (OMNIPAQUE ) 350 MG/ML injection 75 mL (75 mLs Intravenous Contrast Given 12/02/23 0851)    Clinical Course as of 12/02/23 1133  Mon Dec 02, 2023  9056 Fleeta rothman [CP]    Clinical Course User Index [CP] Rosan Sherlean DEL, PA-C                                 Medical Decision Making Amount and/or Complexity of Data Reviewed Labs: ordered.    This patient is a 73 y.o. male  who presents to the ED for concern of hematuria.   Differential diagnoses prior to evaluation: The emergent differential diagnosis includes, but is not limited to, urinary tract infection, kidney stone, Suspect symptoms related to fungal infection versus other acute urologic complication in context of his recent history and urology consult note This is not an exhaustive differential.   Past Medical History / Co-morbidities / Social History: bipolar disorder, chronic bronchitis, hyperlipidemia, heart failure, CKD  Additional history: Chart reviewed. Pertinent results include: Extensively reviewed lab work, imaging from recent emergency department visit, as well as urology consult note after positive blood culture for  canda  Physical Exam: Physical exam performed. The pertinent findings include: Mild tachycardia  on arrival, pulse 106, vital signs otherwise stable, no significant tenderness to palpation of the abdomen.  Lab Tests/Imaging studies: I personally interpreted labs/imaging and the pertinent results include: BMP notable for elevated BUN, creatinine which are similar compared to his recent baseline but baseline has recently increased secondary to his chronic infections.  CBC overall unremarkable other than some anemia, hemoglobin 10.7, UA is grossly with hematuria, and otherwise difficult to interpret.  Normal lactic acid.  Blood culture, urine culture, fungal culture in process..  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  I agree with the radiologist interpretation.  Consults: Spoke with dr fleeta dam with infectious disease who agrees with plan for admission, will begin micafungin, spoke with Dr. Delia with urology who agrees to consult, no other acute recommendations at this time. Spoke with hospitalist Dr. Claudene who agreees to hospital admission  Medications: I ordered medication including micafungin for candidemia.  I have reviewed the patients home medicines and have made adjustments as needed.   Disposition: After consideration of the diagnostic results and the patients response to treatment, I feel that patient would benefit from admission for complex urine infection 2/2 candida as above .   Final diagnoses:  Candidemia [facility])    ED Discharge Orders     None          Rosan Sherlean DEL, PA-C 12/02/23 1133    Neysa Caron PARAS, [place] 12/02/23 1557

## 2023-12-02 NOTE — H&P (Addendum)
 History and Physical    Patient: Calvin Escobar FMW:[id number] DOB: [birth year] DOA: 12/02/2023 DOS: the patient was seen and examined on 12/02/2023 PCP: [name], MD  Patient coming from: Home  Chief Complaint:  Chief Complaint  Patient presents with   Hematuria   Abnormal Lab   HPI: Calvin Escobar is a 73 y.o. male with medical history significant of hyperlipidemia, HFimpEF, history of colovesical fistula, BPH with urinary retention, anemia of chronic disease, depression, bipolar disorder, and dementia presents with complaints of discomfort and blood in his urine.  Reports that he has been having issues with emptying his bladder for several months.  He was seen in the ED emergency department 2 days ago where ultrasound revealed greater than 500 cc of urine present.  Attempts to place a Foley catheter in the ED with ED nursing, physician, and coud cath team unsuccessful.  Dr. Patrcia of urology ultimately had to be called to place catheter.  Since that time patient has had blood present in his urine and reports mild burning sensation present.  He reports that he was advised to come in after blood cultures from back on 6/18 were noted to be positive for Candida albicans.  Denies having any fevers, chills, lightheadedness, dizziness, or stomach pains.  Upon admission into the emergency department patient was noted to be afebrile with pulse 106, and O2 saturations maintained.  Labs significant for hemoglobin 10.7, BUN 29, creatinine 1.88, and lactic acid 0.6.  Urinalysis was not reported was limited due to color, and no rare bacteria, greater than 50 RBC/hpf, and 0-5 WBCs.  Urine and blood cultures were obtained.  Patient was started on micafungin IV. Urology and ID was notified and will see the patient.  Review of Systems: As mentioned in the history of present illness. All other systems reviewed and are negative. 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 History of GI bleeding (07/2023). Currently on ferrous sulfate  supplementation.   DIAGNOSTIC ASSESSMENT: ? Primary etiology (70-80% probability): Anemia of chronic disease/inflammation ? Secondary considerations: Iron deficiency component (elevated RD   DCM (dilated cardiomyopathy) (HCC)    nonischemic with normal coronary arteries at cath 06/2019.  EF 35 to 40% on echo 04/2021   Dementia (HCC)    Depression    Diarrhea 05/13/2023   E coli bacteremia 05/14/2023   High anion gap metabolic acidosis 07/13/2023   History of Clostridioides difficile colitis 08/15/2023   Associated with colovesical fistula? 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  Endoscopy esophageal stricture 2016     Septic shock from UTI 06/02/2023   Severe sepsis (HCC) 06/03/2023   SKIN CANCER, HX OF 05/10/2007   Facial left check and forehead and r shoulder  F/w derm   Syncope 05/13/2023   Thrombocytopenia (HCC) 06/02/2023   Lab Results  Component    Value    Date           PLT    301    07/19/2023           PLT    324    07/18/2023           PLT    268    07/16/2023           PLT    207    07/14/2023           PLT    228    07/13/2023             Lab Results      Component    Value    Date           ESRSEDRATE    60 (H)    05/08/2023     No results found for: CRP           Lab Results      Component    Value       Urinary catheter complication (HCC) 06/24/2023   Urinary dribbling 03/14/2022   Urinary incontinence 11/21/2022      Urinary Incontinence: They are wearing disposable diapers daily due to urinary incontinence, primarily nocturnal, and are currently on Tamsulosin  (Flomax ). We will refer them to Urology for further evaluation and potential treatment options and continue Tamsulosin  until they are seen by Urology.     Urinary tract infection without hematuria 06/02/2023   UTI (urinary tract infection) 07/01/2023   Past Surgical History:  Procedure Laterality Date   COLONOSCOPY     RIGHT/LEFT HEART CATH AND CORONARY ANGIOGRAPHY N/A 07/03/2019   Procedure: RIGHT/LEFT HEART CATH AND CORONARY ANGIOGRAPHY;  Surgeon: Cherrie Toribio SAUNDERS, MD;  Location: [facility] INVASIVE CV LAB;  Service: Cardiovascular;  Laterality: N/A;   Social History:  reports that he has never smoked. He has never used smokeless tobacco. He reports that he does not currently use alcohol after a past usage of about 14.0 standard drinks of alcohol per week. He reports that he does not use drugs.  Allergies  Allergen Reactions   Albuterol  Palpitations    Supraventricular Tachycardia     Family History  Problem Relation Age of Onset   Hypertension Mother    Cancer Father    Bone cancer Sister     Prior to Admission medications   Medication Sig Start Date End Date Taking? Authorizing Provider  ARIPiprazole  (ABILIFY ) 2 MG tablet Take 1 tablet (2 mg total) by mouth in the morning. 08/22/23   [name], MD  busPIRone  (BUSPAR ) 10 MG tablet Take 1 tablet (10 mg total) by mouth 3 (three) times daily. 09/16/23   [name], MD  carvedilol  (COREG ) 12.5 MG tablet Take 12.5 mg by mouth 2 (two) times daily. 10/28/23   [provider]  ferrous sulfate  325 (65 FE) MG tablet Take 1 tablet (325 mg total) by mouth 2 (two) times daily with a meal. 07/19/23   Rai, Ripudeep K, MD   finasteride (PROSCAR) 5 MG tablet Take 5 mg by mouth daily. 11/06/23   [provider]  fluconazole  (DIFLUCAN ) 200 MG tablet Take 1 tablet (200 mg total) by mouth daily. Take 1 tablet on day one, then only half tablet daily until all tablets gone (15d total) 09/22/23   [name], MD  folic acid  (FOLVITE ) 1 MG tablet Take 1 mg by mouth daily. 10/21/23  [provider]  gabapentin  (NEURONTIN ) 100 MG capsule Take 1 capsule (100 mg total) by mouth 3 (three) times daily. 09/16/23   [name], MD  losartan  (COZAAR ) 50 MG tablet Take 50 mg by mouth daily. 10/28/23   [provider]  melatonin 3 MG TABS tablet Take 1 tablet (3 mg total) by mouth at bedtime as needed (insomnia). 07/19/23   Rai, Nydia POUR, MD  midodrine  (PROAMATINE ) 5 MG tablet Take 1 tablet (5 mg total) by mouth 3 (three) times daily with meals. 06/11/23   Krishnan, Gokul, MD  mirtazapine  (REMERON ) 30 MG tablet Take 1 tablet (30 mg total) by mouth at bedtime. 09/16/23   [name], MD  pravastatin  (PRAVACHOL ) 20 MG tablet Take 1 tablet (20 mg total) by mouth at bedtime. 12/19/22   [name], MD  primidone  (MYSOLINE ) 50 MG tablet Take 1 tablet (50 mg total) by mouth 2 (two) times daily. 06/11/23   Krishnan, Gokul, MD  tamsulosin  (FLOMAX ) 0.4 MG CAPS capsule TAKE 1 CAPSULE BY MOUTH EVERY DAY 07/23/23   [name], MD  traZODone  (DESYREL ) 100 MG tablet Take 1 tablet (100 mg total) by mouth at bedtime. 07/19/23   Davia Nydia POUR, MD    Physical Exam: Vitals:   12/02/23 9483 12/02/23 0520 12/02/23 0845 12/02/23 0925  BP: 105/79   (!) 137/91  Pulse: (!) 106   84  Resp: 16   16  Temp: (!) 97.4 F (36.3 C)  98.2 F (36.8 C)   TempSrc: Oral  Oral   SpO2: 91%   100%  Weight:  108 kg    Height:  6' (1.829 m)      Constitutional: Elderly male currently in no acute distress Eyes: PERRL, lids and conjunctivae normal ENMT: Mucous membranes are moist. Posterior pharynx clear of any exudate or  lesions.Normal dentition.  Neck: normal, supple, no masses, no thyromegaly Respiratory: clear to auscultation bilaterally, no wheezing, no crackles. Normal respiratory effort. No accessory muscle use.  Cardiovascular: Tachycardic. No extremity edema. 2+ pedal pulses. No carotid bruits.  Abdomen: no tenderness, no masses palpated. No hepatosplenomegaly. Bowel sounds positive.  Musculoskeletal: no clubbing / cyanosis. No joint deformity upper and lower extremities. Good ROM, no contractures. Normal muscle tone.  Skin: no rashes, lesions, ulcers. No induration Neurologic: CN 2-12 grossly intact. Sensation intact, DTR normal. Strength 5/5 in all 4.  Psychiatric: Normal judgment and insight. Alert and oriented x 3. Normal mood.   Data Reviewed:  Reviewed labs, imaging, and pertinent records as documented.  Assessment and Plan:  Candidemia Acute.  Patient denies having any recent fevers or chills.  Blood cultures from 5/18 were noted to be positive for Candida albicans.  WBC and lactic acid were both within normal limits.  ID consulted and following along. - Admit to the medical telemetry bed - Follow-up blood, fungus, and urine cultures - Add-on CRP - Continue micafungin - ID consulted,  will follow-up for any further recommendations  Hematuria BPH with urinary retention   Acute.  Patient has a history of chronic urinary retention secondary to BPH and urethral stricture recently had Foley catheter placed by urology 2 days ago.  Urinalysis at that time did appear to show concern for infection but no culture had been obtained.  Repeat urinalysis today unable to report due to color.   Scheduled follow-up with Dr. Carolee on 7/23 and recommended monthly changings of the Foley catheter until colovesical fistula is able to be repaired. - Continue  finasteride and tamsulosin  - Continue Foley catheter  Suspect acute kidney injury superimposed on chronic kidney disease stage IIIa Creatinine noted to be  1.88 with BUN 29.  Baseline creatinine previously noted to be around 1-1.3 earlier this year prior to April/March. -Monitor intake and output - Gentle IV fluids at 75 mL/h overnight - Recheck kidney function in a.m.  Anemia of chronic disease Hemoglobin 10.7 g/dL which appears slightly higher than when checked 2 days ago. - Recheck H&H in a.m.  Dementia - Delirium precautions  Anxiety/bipolar disorder - Continue Abilify , BuSpar , mirtazapine , venlafaxine   Hyperlipidemia - Continue pravastatin   History of colovesical fistula CV fistula with recurrent bacteruria and few pyelo admissions. IN process of eval for repair by general surgery. Colonoscopy pending with Dr. Legrand of GI 12/23/23.   Obesity BMI 32.29 kg/m  DVT prophylaxis: SCDs Advance Care Planning:   Code Status: Limited: Do not attempt resuscitation (DNR) -DNR-LIMITED -Do Not Intubate/DNI     Consults: Urology, ID  Family Communication: Unable to reach patient's wife via phone.  Patient notes she is at work  Severity of Illness: The appropriate patient status for this patient is INPATIENT. Inpatient status is judged to be reasonable and necessary in order to provide the required intensity of service to ensure the patient's safety. The patient's presenting symptoms, physical exam findings, and initial radiographic and laboratory data in the context of their chronic comorbidities is felt to place them at high risk for further clinical deterioration. Furthermore, it is not anticipated that the patient will be medically stable for discharge from the hospital within 2 midnights of admission.   * I certify that at the point of admission it is my clinical judgment that the patient will require inpatient hospital care spanning beyond 2 midnights from the point of admission due to high intensity of service, high risk for further deterioration and high frequency of surveillance required.*  Author: Maximino DELENA Sharps, MD 12/02/2023 11:08  AM  For on call review [email or web address].

## 2023-12-02 NOTE — Consult Note (Signed)
 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 difficile colitis BPH and history of urethral stricture and issues with obstructive uropathy   Plan:  Repeat blood cultures today Start micafungin Follow-up on susceptibility data from Candida albicans Agree with urology consult He tells me that there are plans to address his fistula He needs a dilated funduscopic exam to exclude fungal or fungal endophthalmitis   Principal Problem:   Candida UTI   Scheduled Meds:  senna-docusate  1 tablet Oral BID   sodium chloride  flush  3 mL Intravenous Q12H   Continuous Infusions:  sodium chloride      micafungin (MYCAMINE) 100 mg in sodium chloride  0.9 % 100 mL IVPB Stopped (12/02/23 1250)   PRN Meds:.acetaminophen  **OR** acetaminophen   HPI: Calvin Escobar is a 73 y.o. male with history of multiple medical problems including prior alcohol abuse dementia dilated cardiomyopathy prior C. difficile colitis  chronic colovesical fistula who had been seen by my partner Dr. Dea for candiduria who had blood culture pefrormed in Dr. Justus office that subsequently turned + for C albicans 11 days later on June 29th.  Directed to come to the ER for admission and ID consultation as well as urologic consultation.  CT of the abdomen pelvis performed today showed marked bladder wall thickening with surrounding edema and left-sided hydronephrosis with suspected tract from sigmoid colon to posterior bladder concerning for chronic colovesicular fistula  Note he was seen by urology last week for urinary retention and underwent bedside cystoscopy with urethral cystourethroscope he to address the short segment high stricture with large prostate trabeculated bladder and placement  of Foley catheter.  Patient currently has no complaints but came back to the emergency department.  We are going to repeat his blood cultures and start him on micafungin.  He should also have a dedicated funduscopic eye exam to exclude fungal endophthalmitis  I have personally spent 80 minutes involved in face-to-face and non-face-to-face activities for this patient on the day of the visit. Professional time spent includes the following activities: Preparing to see the patient reviewing microbiological data pertinent labs CT scans obtaining and/or reviewing separately obtained history (admission/discharge record), Performing a medically appropriate examination and/or evaluation , Ordering cultures micafungin referring and communicating with other health care professionals--Dr. Sharps Documenting clinical information in the EMR, Independently interpreting results (not separately reported), Communicating results to the patient, Counseling and educating the patient/family/caregiver and Care coordination (not separately reported).   Evaluation of the patient requires complex antimicrobial therapy evaluation, counseling , isolation needs to reduce disease transmission and risk assessment and mitigation.    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 History of GI bleeding (  07/2023). Currently on ferrous sulfate   supplementation.   DIAGNOSTIC ASSESSMENT: ? Primary etiology (70-80% probability): Anemia of chronic disease/inflammation ? Secondary considerations: Iron deficiency component (elevated RD   DCM (dilated cardiomyopathy) (HCC)    nonischemic with normal coronary arteries at cath 06/2019.  EF 35 to 40% on echo 04/2021   Dementia (HCC)    Depression    Diarrhea 05/13/2023   E coli bacteremia 05/14/2023   High anion gap metabolic acidosis 07/13/2023   History of Clostridioides difficile colitis 08/15/2023   Associated with colovesical fistula? 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  Endoscopy esophageal stricture 2016     Septic shock from UTI 06/02/2023   Severe sepsis (HCC) 06/03/2023   SKIN CANCER, HX OF 05/10/2007   Facial left check and forehead and r shoulder  F/w derm   Syncope 05/13/2023   Thrombocytopenia (HCC) 06/02/2023   Lab Results      Component    Value    Date           PLT    301    07/19/2023           PLT    324    07/18/2023           PLT    268    07/16/2023           PLT    207    07/14/2023           PLT    228    07/13/2023             Lab Results      Component    Value    Date           ESRSEDRATE    60 (H)    05/08/2023     No results found for: CRP          Lab Results      Component    Value       Urinary catheter complication (HCC) 06/24/2023   Urinary dribbling 03/14/2022   Urinary incontinence 11/21/2022      Urinary Incontinence: They are wearing disposable diapers daily due to urinary incontinence, primarily nocturnal, and are currently on Tamsulosin  (Flomax ). We will refer them to Urology for further evaluation and potential treatment options and continue Tamsulosin  until they are seen by Urology.     Urinary tract infection without hematuria 06/02/2023   UTI (urinary tract  infection) 07/01/2023    Social History   Tobacco Use   Smoking status: Never   Smokeless tobacco: Never  Vaping Use   Vaping status: Never Used  Substance Use Topics   Alcohol use: Not Currently    Alcohol/week: 14.0 standard drinks of alcohol    Types: 14 Cans of beer per week   Drug use: No    Family History  Problem Relation Age of Onset   Hypertension Mother    Cancer Father    Bone cancer Sister    Allergies  Allergen Reactions   Albuterol  Palpitations    Supraventricular Tachycardia     OBJECTIVE: Blood pressure 136/88, pulse (!) 107, temperature 98.1 F (36.7 C), temperature source Oral, resp. rate 18, height 6' (1.829 m), weight 108 kg, SpO2 98%.  Physical Exam Constitutional:  Appearance: He is well-developed.  HENT:     Head: Normocephalic and atraumatic.   Eyes:     Conjunctiva/sclera: Conjunctivae normal.    Cardiovascular:     Rate and Rhythm: Normal rate and regular rhythm.  Pulmonary:     Effort: Pulmonary effort is normal. No respiratory distress.     Breath sounds: No wheezing.  Abdominal:     General: There is no distension.     Palpations: Abdomen is soft.   Musculoskeletal:        General: No tenderness. Normal range of motion.     Cervical back: Normal range of motion and neck supple.   Skin:    General: Skin is warm and dry.     Coloration: Skin is not pale.     Findings: No erythema or rash.   Neurological:     General: No focal deficit present.     Mental Status: He is alert and oriented to person, place, and time.   Psychiatric:        Mood and Affect: Mood normal.        Behavior: Behavior normal.        Thought Content: Thought content normal.        Judgment: Judgment normal.     Lab Results Lab Results  Component Value Date   WBC 8.5 12/02/2023   HGB 10.7 (L) 12/02/2023   HCT 34.7 (L) 12/02/2023   MCV 93.0 12/02/2023   PLT 200 12/02/2023    Lab Results  Component Value Date   CREATININE 1.88 (H)  12/02/2023   BUN 29 (H) 12/02/2023   NA 137 12/02/2023   K 4.2 12/02/2023   CL 107 12/02/2023   CO2 22 12/02/2023    Lab Results  Component Value Date   ALT 5 11/30/2023   AST 11 (L) 11/30/2023   ALKPHOS 80 11/30/2023   BILITOT <0.2 11/30/2023     Microbiology: No results found for this or any previous visit (from the past 240 hours).  Jomarie Fleeta Rothman, MD [facility] [phone] pager  12/02/2023, 4:21 PM

## 2023-12-03 DIAGNOSIS — B377 Candidal sepsis: Secondary | ICD-10-CM | POA: Diagnosis not present

## 2023-12-03 DIAGNOSIS — B3789 Other sites of candidiasis: Secondary | ICD-10-CM

## 2023-12-03 LAB — CBC
HCT: 30 % — ABNORMAL LOW (ref 39.0–52.0)
Hemoglobin: 9.4 g/dL — ABNORMAL LOW (ref 13.0–17.0)
MCH: 28.7 pg (ref 26.0–34.0)
MCHC: 31.3 g/dL (ref 30.0–36.0)
MCV: 91.5 fL (ref 80.0–100.0)
Platelets: 187 10*3/uL (ref 150–400)
RBC: 3.28 MIL/uL — ABNORMAL LOW (ref 4.22–5.81)
RDW: 14.5 % (ref 11.5–15.5)
WBC: 8.4 10*3/uL (ref 4.0–10.5)
nRBC: 0 % (ref 0.0–0.2)

## 2023-12-03 LAB — BASIC METABOLIC PANEL WITH GFR
Anion gap: 5 (ref 5–15)
BUN: 25 mg/dL — ABNORMAL HIGH (ref 8–23)
CO2: 24 mmol/L (ref 22–32)
Calcium: 8.3 mg/dL — ABNORMAL LOW (ref 8.9–10.3)
Chloride: 108 mmol/L (ref 98–111)
Creatinine, Ser: 1.81 mg/dL — ABNORMAL HIGH (ref 0.61–1.24)
GFR, Estimated: 39 mL/min — ABNORMAL LOW (ref >60)
Glucose, Bld: 109 mg/dL — ABNORMAL HIGH (ref 70–99)
Potassium: 3.7 mmol/L (ref 3.5–5.1)
Sodium: 137 mmol/L (ref 135–145)

## 2023-12-03 LAB — GLUCOSE, CAPILLARY: Glucose-Capillary: 139 mg/dL — ABNORMAL HIGH (ref 70–99)

## 2023-12-03 MED ORDER — PRIMIDONE 50 MG PO TABS
50.0000 mg | ORAL_TABLET | Freq: Three times a day (TID) | ORAL | Status: DC
  Administered 2023-12-03 – 2023-12-07 (×12): 50 mg via ORAL
  Filled 2023-12-03 (×13): qty 1

## 2023-12-03 NOTE — Consult Note (Addendum)
 Ophthalmology Note   This is a 73 yo male with PMHx of hyperlipidemia, heart failure, BPH, anemia who was recently diagnosed with candidemia.  Ophthalmology consulted to rule out endophthalmitis.  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  On exam pt was found to be 20/40 OD and 20/60 OS with his current glasses.  He states his left eye has always been his weaker eye.  PERRL, EOMI, full ductions and versions.  Visual field full to confrontation.  IOP was 14 OU.  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  1 old punched out scar temporal to macula left eye.  No retinal breaks or tears and no inflammation seen.  No sign of endophthalmitis  A/P Candidemia: no sign of ocular involvement. Please feel free to reconsult if develops visual changes.   Pseudophakic OU: stable ARMD OU: being followed by optometrist and sees retina specialist next month Arcus senilis: stable Dermatochalasis OU   Thank you for allowing me to participate in the care of this patient.  Please feel free to contact me if you have any concerns.    Evalene Raw, M.D. Office: [phone] Cell: [phone]

## 2023-12-03 NOTE — Progress Notes (Signed)
 Subjective: No new complaints   Antibiotics:  Anti-infectives (From admission, onward)    Start     Dose/Rate Route Frequency Ordered Stop   12/02/23 1000  micafungin (MYCAMINE) 100 mg in sodium chloride  0.9 % 100 mL IVPB        100 mg 105 mL/hr over 1 Hours Intravenous Every 24 hours 12/02/23 0946         Medications: Scheduled Meds:  ARIPiprazole   2 mg Oral q AM   busPIRone   10 mg Oral TID   carvedilol   12.5 mg Oral BID   Chlorhexidine  Gluconate Cloth  6 each Topical Daily   finasteride  5 mg Oral Daily   folic acid   1 mg Oral Daily   gabapentin   100 mg Oral TID   mirtazapine   30 mg Oral QHS   pravastatin   20 mg Oral QHS   primidone   50 mg Oral Q8H   senna-docusate  1 tablet Oral BID   sodium chloride  flush  3 mL Intravenous Q12H   tamsulosin   0.4 mg Oral Daily   venlafaxine  XR  150 mg Oral Daily   Continuous Infusions:  sodium chloride  75 mL/hr at 12/03/23 0923   micafungin (MYCAMINE) 100 mg in sodium chloride  0.9 % 100 mL IVPB 100 mg (12/03/23 0924)   PRN Meds:.acetaminophen  **OR** acetaminophen , melatonin    Objective: Weight change:   Intake/Output Summary (Last 24 hours) at 12/03/2023 1719 Last data filed at 12/03/2023 1000 Gross per 24 hour  Intake 1457.2 ml  Output 2875 ml  Net -1417.8 ml   Blood pressure 115/65, pulse 78, temperature 98.3 F (36.8 C), resp. rate 18, height 6' (1.829 m), weight 108 kg, SpO2 93%. Temp:  [98.2 F (36.8 C)-98.3 F (36.8 C)] 98.3 F (36.8 C) (07/01 0418) Pulse Rate:  [78-94] 78 (07/01 0418) Resp:  [18-19] 18 (07/01 0418) BP: (108-115)/(62-65) 115/65 (07/01 0418) SpO2:  [93 %-95 %] 93 % (07/01 0418)  Physical Exam: Physical Exam Constitutional:      Appearance: He is well-developed.  HENT:     Head: Normocephalic and atraumatic.   Eyes:     Conjunctiva/sclera: Conjunctivae normal.    Cardiovascular:     Rate and Rhythm: Normal rate and regular rhythm.  Pulmonary:     Effort: Pulmonary effort is  normal. No respiratory distress.     Breath sounds: No wheezing.  Abdominal:     General: There is no distension.     Palpations: Abdomen is soft.   Musculoskeletal:        General: Normal range of motion.     Cervical back: Normal range of motion and neck supple.   Skin:    General: Skin is warm and dry.     Findings: No erythema or rash.   Neurological:     General: No focal deficit present.     Mental Status: He is alert and oriented to person, place, and time.   Psychiatric:        Mood and Affect: Mood normal.        Behavior: Behavior normal.        Thought Content: Thought content normal.        Judgment: Judgment normal.      CBC:    BMET Recent Labs    12/02/23 0523 12/03/23 0429  NA 137 137  K 4.2 3.7  CL 107 108  CO2 22 24  GLUCOSE 117* 109*  BUN 29* 25*  CREATININE  1.88* 1.81*  CALCIUM  8.9 8.3*     Liver Panel  No results for input(s): PROT, ALBUMIN , AST, ALT, ALKPHOS, BILITOT, BILIDIR, IBILI in the last 72 hours.     Sedimentation Rate No results for input(s): ESRSEDRATE in the last 72 hours. C-Reactive Protein Recent Labs    12/02/23 1407  CRP 5.9*    Micro Results: Recent Results (from the past 720 hours)  Fungus culture, blood     Status: Abnormal   Collection Time: 11/20/23  1:28 PM   Specimen: Blood   BLD  Result Value Ref Range Status   Fungus (Mycology) Culture Final report (A)  Final   Fungal result 1 Candida albicans (A)  Final    Comment: Heavy growth  Urine Culture     Status: None   Collection Time: 11/20/23  2:28 PM   Specimen: Urine  Result Value Ref Range Status   MICRO NUMBER: [id number]  Final   SPECIMEN QUALITY: Adequate  Final   Sample Source URINE  Final   STATUS: FINAL  Final   Result: No Growth  Final  Blood culture (routine x 2)     Status: None (Preliminary result)   Collection Time: 12/02/23  7:40 AM   Specimen: BLOOD LEFT ARM  Result Value Ref Range Status   Specimen Description  BLOOD LEFT ARM  Final   Special Requests   Final    BOTTLES DRAWN AEROBIC AND ANAEROBIC Blood Culture results may not be optimal due to an inadequate volume of blood received in culture bottles   Culture   Final    NO GROWTH < 24 HOURS Performed at [facility], [address], [place], [place] [address]    Report Status PENDING  Incomplete  Blood culture (routine x 2)     Status: None (Preliminary result)   Collection Time: 12/02/23  7:41 AM   Specimen: BLOOD  Result Value Ref Range Status   Specimen Description BLOOD RIGHT ANTECUBITAL  Final   Special Requests   Final    BOTTLES DRAWN AEROBIC AND ANAEROBIC Blood Culture adequate volume   Culture   Final    NO GROWTH < 24 HOURS Performed at [facility], [address] [place], [place] [address]    Report Status PENDING  Incomplete  Fungus culture, blood     Status: None (Preliminary result)   Collection Time: 12/02/23  2:08 PM   Specimen: BLOOD  Result Value Ref Range Status   Specimen Description BLOOD SITE NOT SPECIFIED  Final   Special Requests   Final    BOTTLES DRAWN AEROBIC ONLY Blood Culture adequate volume   Culture   Final    NO GROWTH < 24 HOURS Performed at [facility], [address], [place], [place] [address]    Report Status PENDING  Incomplete    Studies/Results: CT ABDOMEN PELVIS W CONTRAST Result Date: 12/02/2023 CLINICAL DATA:  Abdominal pain, hematuria. EXAM: CT ABDOMEN AND PELVIS WITH CONTRAST TECHNIQUE: Multidetector CT imaging of the abdomen and pelvis was performed using the standard protocol following bolus administration of intravenous contrast. RADIATION DOSE REDUCTION: This exam was performed according to the departmental dose-optimization program which includes automated exposure control, adjustment of the mA and/or kV according to patient size and/or use of iterative reconstruction technique. CONTRAST:  75mL OMNIPAQUE  IOHEXOL  350 MG/ML SOLN COMPARISON:  06/02/2023. FINDINGS:  Lower chest: No acute findings. Heart is at the upper limits of normal in size with left ventricular dilatation.  No pericardial or pleural effusion. Distal esophagus is grossly unremarkable. Hepatobiliary: Liver and gallbladder are unremarkable. No biliary ductal dilatation. Pancreas: Negative. Spleen: Negative. Adrenals/Urinary Tract: Scarring in the right kidney. Low-attenuation lesions in the kidneys. No specific follow-up necessary. Left renal edema with mild left hydronephrosis which appears to be secondary to marked bladder wall thickening. Right ureter is decompressed. Foley catheter and air are seen in the bladder. There is haziness and fluid surrounding the bladder. Stomach/Bowel: Stomach, small bowel, appendix and majority of the colon are unremarkable. There appears to be a low-attenuation tract extending from the rectosigmoid colon to the posterior margin of the bladder (3/67-70). Vascular/Lymphatic: Atherosclerotic calcification of the aorta. No pathologically enlarged lymph nodes. Reproductive: Prostate is visualized. Other: No free fluid.  Mesenteries and peritoneum are unremarkable. Musculoskeletal: Degenerative changes in the spine. IMPRESSION: 1. Marked bladder wall thickening with surrounding edema and fluid, indicative of severe cystitis. Associated mild left hydronephrosis. 2. Suspected tract from the sigmoid colon to the posterior bladder wall, worrisome for a chronic colovesical fistula. 3.  Aortic atherosclerosis (ICD10-I70.0). Electronically Signed   By: Newell Eke M.D.   On: 12/02/2023 09:00      Assessment/Plan:  INTERVAL HISTORY: She has been seen by ophthalmology and does not have evidence of fungal endophthalmitis Candida albicans sensitivities have now been requested from [facility]   Principal Problem:   Candidemia (HCC) Active Problems:   Dementia (HCC)   HLD (hyperlipidemia)   Anemia of chronic disease   Acute kidney injury superimposed on chronic kidney disease  (HCC)   Colovesical fistula   Hematuria   BPH with urinary obstruction   Anxiety   Bipolar disorder (HCC)   Obesity (BMI 30-39.9)    [place] Damiean Lukes is a 73 y.o. 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  #1 Candidemia:  Foley from urinary source repeat cultures in the ER so far are without growth.  We have asked [facility] to run sensitivities for Candida albicans   I would continue with micafungin present  Greatly appreciate ophthalmology seeing the patient and ruling out fungal endophthalmitis  Appreciate urology having seen the patient though there is no mention about the low vesicular fistula this does sound to be something that will not be addressed until he has had colonoscopy and other interventions.   I have personally spent 50 minutes involved in face-to-face and non-face-to-face activities for this patient on the day of the visit. Professional time spent includes the following activities: Preparing to see the patient (review of tests), Obtaining and/or reviewing separately obtained history (admission/discharge record), Performing a medically appropriate examination and/or evaluation , Ordering medications/tests/procedures, referring and communicating with other health care professionals, Documenting clinical information in the EMR, Independently interpreting results (not separately reported), Communicating results to the patient/family/caregiver, Counseling and educating the patient/family/caregiver and Care coordination (not separately reported).   Evaluation of the patient requires complex antimicrobial therapy evaluation, counseling , isolation needs to reduce disease transmission and risk assessment and mitigation.     LOS: 1 day   Jomarie Fleeta Rothman 12/03/2023, 5:19 PM

## 2023-12-03 NOTE — Plan of Care (Signed)
   Problem: Education: Goal: Knowledge of General Education information will improve Description: Including pain rating scale, medication(s)/side effects and non-pharmacologic comfort measures Outcome: Progressing   Problem: Elimination: Goal: Will not experience complications related to bowel motility Outcome: Progressing   Problem: Safety: Goal: Ability to remain free from injury will improve Outcome: Progressing

## 2023-12-03 NOTE — Plan of Care (Signed)

## 2023-12-03 NOTE — Progress Notes (Addendum)
 PROGRESS NOTE    Calvin Escobar  FMW:[id number] DOB: 13-May-1951 DOA: 12/02/2023 PCP: [name], MD   Brief Narrative: 73 year old with medical history significant for hyperlipidemia, heart failure with  impaired ejection fraction, history of colovesical fistula, BPH with urinary retention, anemia of chronic disease, depression, bipolar disorder, dementia presents complaining of blood in his urine and discomfort.  Patient was seen in the ED 2 days prior to admission for urine retention, urology placed Foley catheter.  Blood cultures obtained on 6/18 came back positive for Candida.  Patient presented for further evaluation.   Assessment & Plan:   Principal Problem:   Candidemia (HCC) Active Problems:   Hematuria   BPH with urinary obstruction   Acute kidney injury superimposed on chronic kidney disease (HCC)   Anemia of chronic disease   Dementia (HCC)   Anxiety   Bipolar disorder (HCC)   HLD (hyperlipidemia)   Colovesical fistula   Obesity (BMI 30-39.9)  1-Candidemia -Patient blood culture obtain ED visit 6/18 came back positive for candida.  -ID has been consulted -Continue with Micafungi.  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 -Patient developed urinary retention secondary to BPH and urethral stricture recently had Foley catheter placed by urology 2 days prior to admission. -Continue to  have hematuria. CT abdomen and pelvis: Marked bladder wall thickening with surrounding edema and fluid, indicative of severe cystitis. Associated mild left hydronephrosis. Suspected tract from the sigmoid colon to the posterior bladder wall, worrisome for a chronic colovesical fistula. -Urology consulted. 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 IN process of eval for repair by general surgery. Colonoscopy pending with Dr. Legrand of GI 12/23/23.   Obesity: BMI 32     Estimated body mass index is 32.29 kg/m as calculated from the following:   Height as of this encounter: 6' (1.829 m).   Weight as of this encounter: 108 kg.   DVT prophylaxis: SCD due to hematuria Code Status: DNR Family Communication: no family at beside Disposition Plan:  Status is: Inpatient Remains inpatient appropriate because: management of candidemia    Consultants:  ID Urology   Procedures:    Antimicrobials:    Subjective: He is mot feeling well. Still having hematuria.  Couldn't sleep last night   Objective: Vitals:   12/02/23 1245 12/02/23 1351 12/02/23 2032 12/03/23 0418  BP: 138/87 136/88 108/62 115/65  Pulse: 92 (!) 107 94 78  Resp: 16 18 19 18   Temp:  98.1 F (36.7 C) 98.2 F (36.8 C) 98.3 F (36.8 C)  TempSrc:  Oral    SpO2: 94% 98% 95% 93%  Weight:      Height:        Intake/Output Summary (Last 24 hours) at 12/03/2023 0726 Last data filed at 12/03/2023 0648 Gross per 24 hour  Intake 1562.18 ml  Output 3225 ml  Net -1662.82 ml   Filed Weights   12/02/23 0520  Weight: 108 kg    Examination:  General exam: Appears calm and comfortable  Respiratory system:  Clear to auscultation. Respiratory effort normal. Cardiovascular system: S1 & S2 heard, RRR. Gastrointestinal system: Abdomen is nondistended, soft and nontender. No organomegaly or masses felt. Normal bowel sounds heard. Central nervous system: Alert and oriented.  Extremities: Symmetric 5 x 5 power.   Data  Reviewed: I have personally reviewed following labs and imaging studies  CBC: Recent Labs  Lab 11/30/23 0941 12/02/23 0523 12/03/23 0429  WBC 5.8 8.5 8.4  NEUTROABS 3.0  --   --   HGB 9.6* 10.7* 9.4*  HCT 32.7* 34.7* 30.0*  MCV 98.2 93.0 91.5  PLT 177 200 187   Basic Metabolic Panel: Recent Labs  Lab 11/27/23 1553 11/30/23 0941 12/02/23 0523 12/03/23 0429  NA 141 139 137 137  K 4.8 5.0 4.2 3.7  CL 104 106 107 108  CO2 29 25 22 24   GLUCOSE 105* 81 117* 109*  BUN 33* 31* 29* 25*  CREATININE 1.89* 1.78* 1.88* 1.81*  CALCIUM  9.0 8.9 8.9 8.3*   GFR: Estimated Creatinine Clearance: 44.8 mL/min (A) (by C-G formula based on SCr of 1.81 mg/dL (H)). Liver Function Tests: Recent Labs  Lab 11/30/23 0941  AST 11*  ALT 5  ALKPHOS 80  BILITOT <0.2  PROT 7.2  ALBUMIN  3.6   No results for input(s): LIPASE, AMYLASE in the last 168 hours. No results for input(s): AMMONIA in the last 168 hours. Coagulation Profile: No results for input(s): INR, PROTIME in the last 168 hours. Cardiac Enzymes: No results for input(s): CKTOTAL, CKMB, CKMBINDEX, TROPONINI in the last 168 hours. BNP (last 3 results) No results for input(s): PROBNP in the last 8760 hours. HbA1C: No results for input(s): HGBA1C in the last 72 hours. CBG: No results for input(s): GLUCAP in the last 168 hours. Lipid Profile: No results for input(s): CHOL, HDL, LDLCALC, TRIG, CHOLHDL, LDLDIRECT in the last 72 hours. Thyroid  Function Tests: No results for input(s): TSH, T4TOTAL, FREET4, T3FREE, THYROIDAB in the last 72 hours. Anemia Panel: No results for input(s): VITAMINB12, FOLATE, FERRITIN, TIBC, IRON, RETICCTPCT in the last 72 hours. Sepsis Labs: Recent Labs  Lab 12/02/23 0803 12/02/23 1019  LATICACIDVEN 0.7 0.6    Recent Results (from the past 240 hours)  Blood culture (routine x 2)     Status: None (Preliminary result)   Collection Time: 12/02/23   7:40 AM   Specimen: BLOOD LEFT ARM  Result Value Ref Range Status   Specimen Description BLOOD LEFT ARM  Final   Special Requests   Final    BOTTLES DRAWN AEROBIC AND ANAEROBIC Blood Culture results may not be optimal due to an inadequate volume of blood received in culture bottles   Culture   Final    NO GROWTH < 24 HOURS Performed at [facility], [address] [place], [place] [address]    Report Status PENDING  Incomplete  Blood culture (routine x 2)     Status: None (Preliminary result)   Collection Time: 12/02/23  7:41 AM   Specimen: BLOOD  Result Value Ref Range Status   Specimen Description BLOOD RIGHT ANTECUBITAL  Final   Special Requests   Final    BOTTLES DRAWN AEROBIC AND ANAEROBIC Blood Culture adequate volume   Culture   Final    NO GROWTH < 24 HOURS Performed at [facility], [address], [place], [place] [address]    Report Status PENDING  Incomplete  Fungus culture, blood     Status: None (Preliminary result)   Collection Time: 12/02/23  2:08 PM   Specimen: BLOOD  Result Value Ref Range Status   Specimen Description BLOOD SITE NOT SPECIFIED  Final   Special Requests   Final    BOTTLES DRAWN AEROBIC ONLY Blood Culture adequate volume   Culture   Final    NO GROWTH < 24 HOURS Performed at [facility], [address], [place], [place] [address]    Report Status PENDING  Incomplete         Radiology Studies: CT ABDOMEN PELVIS W CONTRAST Result Date: 12/02/2023 CLINICAL DATA:  Abdominal pain, hematuria. EXAM: CT ABDOMEN AND PELVIS WITH CONTRAST TECHNIQUE: Multidetector CT imaging of the abdomen and pelvis was performed using the standard protocol following bolus administration of intravenous contrast. RADIATION DOSE REDUCTION: This exam was performed according to the departmental dose-optimization program which includes automated exposure control, adjustment of the mA and/or kV according to patient size and/or use of iterative  reconstruction technique. CONTRAST:  75mL OMNIPAQUE  IOHEXOL  350 MG/ML SOLN COMPARISON:  06/02/2023. FINDINGS: Lower chest: No acute findings. Heart is at the upper limits of normal in size with left ventricular dilatation. No pericardial or pleural effusion. Distal esophagus is grossly unremarkable. Hepatobiliary: Liver and gallbladder are unremarkable. No biliary ductal dilatation. Pancreas: Negative. Spleen: Negative. Adrenals/Urinary Tract: Scarring in the right kidney. Low-attenuation lesions in the kidneys. No specific follow-up necessary. Left renal edema with mild left hydronephrosis which appears to be secondary to marked bladder wall thickening. Right ureter is decompressed. Foley catheter and air are seen in the bladder. There is haziness and fluid surrounding the bladder. Stomach/Bowel: Stomach, small bowel, appendix and majority of the colon are unremarkable. There appears to be a low-attenuation tract extending from the rectosigmoid colon to the posterior margin of the bladder (3/67-70). Vascular/Lymphatic: Atherosclerotic calcification of the aorta. No pathologically enlarged lymph nodes. Reproductive: Prostate is visualized. Other: No free fluid.  Mesenteries and peritoneum are unremarkable. Musculoskeletal: Degenerative changes in the spine. IMPRESSION: 1. Marked bladder wall thickening with surrounding edema and fluid, indicative of severe cystitis. Associated mild left hydronephrosis. 2. Suspected tract from the sigmoid colon to the posterior bladder wall, worrisome for a chronic colovesical fistula. 3.  Aortic atherosclerosis (ICD10-I70.0). Electronically Signed   By: Newell Eke M.D.   On: 12/02/2023 09:00        Scheduled Meds:  ARIPiprazole   2 mg Oral q AM   busPIRone   10 mg Oral TID   carvedilol   12.5 mg Oral BID   Chlorhexidine  Gluconate Cloth  6 each Topical Daily   finasteride  5 mg Oral Daily   folic acid   1 mg Oral Daily   gabapentin   100 mg Oral TID   mirtazapine   30  mg Oral QHS   pravastatin   20 mg Oral QHS   senna-docusate  1 tablet Oral BID   sodium chloride  flush  3 mL Intravenous Q12H   tamsulosin   0.4 mg Oral Daily   venlafaxine  XR  150 mg Oral Daily   Continuous Infusions:  sodium chloride  75 mL/hr at 12/03/23 0537   micafungin (MYCAMINE) 100 mg in sodium chloride  0.9 % 100 mL IVPB Stopped (12/02/23 1250)     LOS: 1 day    Time spent: 35 minutes    Oriah Leinweber A Laterrian Hevener, MD [facility]   If 7PM-7AM, please contact night-coverage [email or web address]  12/03/2023, 7:26 AM

## 2023-12-03 NOTE — Progress Notes (Signed)
 Subjective: First time meeting [name], he was resting on the side of the bed.  Foley catheter in place draining light red/pink clear urine  Objective: Vital signs in last 24 hours: Temp:  [98.1 F (36.7 C)-98.3 F (36.8 C)] 98.3 F (36.8 C) (07/01 0418) Pulse Rate:  [78-107] 78 (07/01 0418) Resp:  [18-19] 18 (07/01 0418) BP: (108-136)/(62-88) 115/65 (07/01 0418) SpO2:  [93 %-98 %] 93 % (07/01 0418)  Assessment/Plan: #ureteral stricture #difficult foley  Seen by Dr. Alvaro on 6/28.  Short segment high-grade stricture noted in mid pendulous urethra.  S/p 64F balloon dilated at bedside.  On cystoscopy, stricture was seen to have been resolved.  3f council placed over wire.  2L of urine returned.  Both hydrodistention from 2L of urine and balloon dilation of a stricture can be responsible for hematuria.  Hematuria is mild and not an unexpected finding.  No indication for urologic intervention.  This will be self-limiting.  Make sure catheter is supported with retention strap off tension.  Please call with questions  Intake/Output from previous day: 06/30 0701 - 07/01 0700 In: 1562.2 [P.O.:800; I.V.:657.2; IV Piggyback:105] Out: 3225 [Urine:3225]  Intake/Output this shift: Total I/O In: -  Out: 550 [Urine:550]  Physical Exam:  General: Alert and oriented CV: No cyanosis Lungs: equal chest rise Abdomen: Soft, NTND, no rebound or guarding Gu: 71f council catheter in place draining clear light red urine  Lab Results: Recent Labs    12/02/23 0523 12/03/23 0429  HGB 10.7* 9.4*  HCT 34.7* 30.0*   BMET Recent Labs    12/02/23 0523 12/03/23 0429  NA 137 137  K 4.2 3.7  CL 107 108  CO2 22 24  GLUCOSE 117* 109*  BUN 29* 25*  CREATININE 1.88* 1.81*  CALCIUM  8.9 8.3*  HGB 10.7* 9.4*  WBC 8.5 8.4     Studies/Results: CT ABDOMEN PELVIS W CONTRAST Result Date: 12/02/2023 CLINICAL DATA:  Abdominal pain, hematuria. EXAM: CT ABDOMEN AND PELVIS WITH CONTRAST  TECHNIQUE: Multidetector CT imaging of the abdomen and pelvis was performed using the standard protocol following bolus administration of intravenous contrast. RADIATION DOSE REDUCTION: This exam was performed according to the departmental dose-optimization program which includes automated exposure control, adjustment of the mA and/or kV according to patient size and/or use of iterative reconstruction technique. CONTRAST:  75mL OMNIPAQUE  IOHEXOL  350 MG/ML SOLN COMPARISON:  06/02/2023. FINDINGS: Lower chest: No acute findings. Heart is at the upper limits of normal in size with left ventricular dilatation. No pericardial or pleural effusion. Distal esophagus is grossly unremarkable. Hepatobiliary: Liver and gallbladder are unremarkable. No biliary ductal dilatation. Pancreas: Negative. Spleen: Negative. Adrenals/Urinary Tract: Scarring in the right kidney. Low-attenuation lesions in the kidneys. No specific follow-up necessary. Left renal edema with mild left hydronephrosis which appears to be secondary to marked bladder wall thickening. Right ureter is decompressed. Foley catheter and air are seen in the bladder. There is haziness and fluid surrounding the bladder. Stomach/Bowel: Stomach, small bowel, appendix and majority of the colon are unremarkable. There appears to be a low-attenuation tract extending from the rectosigmoid colon to the posterior margin of the bladder (3/67-70). Vascular/Lymphatic: Atherosclerotic calcification of the aorta. No pathologically enlarged lymph nodes. Reproductive: Prostate is visualized. Other: No free fluid.  Mesenteries and peritoneum are unremarkable. Musculoskeletal: Degenerative changes in the spine. IMPRESSION: 1. Marked bladder wall thickening with surrounding edema and fluid, indicative of severe cystitis. Associated mild left hydronephrosis. 2. Suspected tract from the sigmoid colon to  the posterior bladder wall, worrisome for a chronic colovesical fistula. 3.  Aortic  atherosclerosis (ICD10-I70.0). Electronically Signed   By: Newell Eke M.D.   On: 12/02/2023 09:00      LOS: 1 day   Ole Bourdon, NP [facility] Pager: [phone]  12/03/2023, 12:50 PM

## 2023-12-04 ENCOUNTER — Ambulatory Visit: Admitting: Internal Medicine

## 2023-12-04 DIAGNOSIS — B377 Candidal sepsis: Secondary | ICD-10-CM | POA: Diagnosis not present

## 2023-12-04 DIAGNOSIS — Z22338 Carrier of other streptococcus: Secondary | ICD-10-CM

## 2023-12-04 HISTORY — DX: Carrier of other streptococcus: Z22.338

## 2023-12-04 LAB — CBC
HCT: 30.2 % — ABNORMAL LOW (ref 39.0–52.0)
Hemoglobin: 9.6 g/dL — ABNORMAL LOW (ref 13.0–17.0)
MCH: 28.9 pg (ref 26.0–34.0)
MCHC: 31.8 g/dL (ref 30.0–36.0)
MCV: 91 fL (ref 80.0–100.0)
Platelets: 184 10*3/uL (ref 150–400)
RBC: 3.32 MIL/uL — ABNORMAL LOW (ref 4.22–5.81)
RDW: 14.4 % (ref 11.5–15.5)
WBC: 9.6 10*3/uL (ref 4.0–10.5)
nRBC: 0 % (ref 0.0–0.2)

## 2023-12-04 LAB — BASIC METABOLIC PANEL WITH GFR
Anion gap: 9 (ref 5–15)
BUN: 29 mg/dL — ABNORMAL HIGH (ref 8–23)
CO2: 21 mmol/L — ABNORMAL LOW (ref 22–32)
Calcium: 8.3 mg/dL — ABNORMAL LOW (ref 8.9–10.3)
Chloride: 105 mmol/L (ref 98–111)
Creatinine, Ser: 1.86 mg/dL — ABNORMAL HIGH (ref 0.61–1.24)
GFR, Estimated: 37 mL/min — ABNORMAL LOW (ref >60)
Glucose, Bld: 114 mg/dL — ABNORMAL HIGH (ref 70–99)
Potassium: 4 mmol/L (ref 3.5–5.1)
Sodium: 135 mmol/L (ref 135–145)

## 2023-12-04 MED ORDER — SIMETHICONE 80 MG PO CHEW
240.0000 mg | CHEWABLE_TABLET | Freq: Once | ORAL | Status: AC
  Administered 2023-12-04: 240 mg via ORAL
  Filled 2023-12-04: qty 3

## 2023-12-04 MED ORDER — NA SULFATE-K SULFATE-MG SULF 17.5-3.13-1.6 GM/177ML PO SOLN
1.0000 | Freq: Once | ORAL | Status: AC
  Administered 2023-12-04: 354 mL via ORAL
  Filled 2023-12-04: qty 1

## 2023-12-04 MED ORDER — TRAZODONE HCL 50 MG PO TABS
100.0000 mg | ORAL_TABLET | Freq: Every day | ORAL | Status: DC
  Administered 2023-12-04 – 2023-12-06 (×3): 100 mg via ORAL
  Filled 2023-12-04 (×3): qty 2

## 2023-12-04 MED ORDER — NA SULFATE-K SULFATE-MG SULF 17.5-3.13-1.6 GM/177ML PO SOLN
0.5000 | Freq: Once | ORAL | Status: AC
  Administered 2023-12-05: 177 mL via ORAL

## 2023-12-04 NOTE — Consult Note (Signed)
 Consultation  Referring Provider:   Dr. Fransisca Primary Care Physician:  [name], MD Primary Gastroenterologist: Dr. Legrand        Reason for Consultation: Bacteremia         HPI:   Calvin Escobar is a 73 y.o. male with a past medical history as listed below including prior alcohol abuse, dementia, dilated cardiomyopathy, prior C. difficile colitis, chronic colovesicular fistula and multiple others, who presented to the hospital after being evaluated by ID with a culture positive for C albicans.  We were called in regards to further workup for this bacteremia.    Patient seen in the hospital last week for urinary retention and underwent bedside cystoscopy with urethral cystourethroscope to address a short segment high stricture with large prostate trabeculated bladder and placement of Foley catheter.    Patient started on Micafungin in the ED.    Today, patient tells me that he is really just here because they found fungus in my blood.  He really feels okay.  No abdominal pain, diarrhea or GI complaints.  His biggest trouble I am in the room is that he is spilled his urine from his Foley catheter all over the floor when trying to get up to go to the bathroom.  He denies any abdominal pain, nausea, vomiting or other GI symptoms.  He feels fairly well at the moment.  He is eager to get his colonoscopy done while he is in the hospital so that he can move forward with repair of this colovesicular fistula.    Denies fever, chills or weight loss.     Hospital course: 12/02/2023 CTAP with marked bladder wall thickening with surrounding edema and left-sided hydronephrosis with suspected tract from the sigmoid colon to the posterior bladder concerning for chronic colovesicular fistula  GI history: Colonoscopy-scheduled for 12/23/2023 11/13/2023 patient retreated for recurrent C. difficile from inpatient February of this year, with Dificid  Past Medical History:  Diagnosis Date    Acute cystitis 05/13/2023   Acute hyponatremia 05/13/2023   Acute metabolic encephalopathy 07/12/2023   Acute respiratory failure with hypoxia (HCC) 06/02/2023   AKI (acute kidney injury) (HCC) 05/13/2023   Allergy seasonal   Ascending aorta dilatation (HCC)    38 mm by 2D echo 04/2021   Cancer (HCC) skin   Candidal UTI (urinary tract infection) 09/22/2023   CLINICAL CONTEXT: 73 year old male with multiple inflammatory conditions including recurrent UTIs, Stage 3 CKD, and 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 History of GI bleeding (07/2023). Currently on ferrous sulfate  supplementation.   DIAGNOSTIC ASSESSMENT: ? Primary etiology (70-80% probability): Anemia of chronic disease/inflammation ? Secondary considerations: Iron deficiency component (elevated RD   DCM (dilated cardiomyopathy) (HCC)    nonischemic with normal coronary arteries at cath 06/2019.  EF 35 to 40% on echo 04/2021   Dementia (HCC)    Depression    Diarrhea 05/13/2023   E coli bacteremia 05/14/2023   High anion gap metabolic acidosis 07/13/2023   History of Clostridioides difficile colitis 08/15/2023   Associated with colovesical fistula? 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  Endoscopy esophageal stricture 2016     Septic shock from UTI 06/02/2023   Severe sepsis (HCC) 06/03/2023   SKIN CANCER, HX OF 05/10/2007   Facial left check and forehead and r shoulder  F/w derm   Syncope 05/13/2023   Thrombocytopenia (HCC) 06/02/2023   Lab Results      Component    Value    Date           PLT    301    07/19/2023           PLT    324    07/18/2023           PLT    268    07/16/2023           PLT    207    07/14/2023           PLT    228    07/13/2023             Lab Results      Component    Value    Date           ESRSEDRATE    60 (H)    05/08/2023     No  results found for: CRP          Lab Results      Component    Value       Urinary catheter complication (HCC) 06/24/2023   Urinary dribbling 03/14/2022   Urinary incontinence 11/21/2022      Urinary Incontinence: They are wearing disposable diapers daily due to urinary incontinence, primarily nocturnal, and are currently on Tamsulosin  (Flomax ). We will refer them to Urology for further evaluation and potential treatment options and continue Tamsulosin  until they are seen by Urology.     Urinary tract infection without hematuria 06/02/2023   UTI (urinary tract infection) 07/01/2023    Past Surgical History:  Procedure Laterality Date   COLONOSCOPY     RIGHT/LEFT HEART CATH AND CORONARY ANGIOGRAPHY N/A 07/03/2019   Procedure: RIGHT/LEFT HEART CATH AND CORONARY ANGIOGRAPHY;  Surgeon: Cherrie Toribio SAUNDERS, MD;  Location: [facility];  Service: Cardiovascular;  Laterality: N/A;    Family History  Problem Relation Age of Onset   Hypertension Mother    Cancer Father    Bone cancer Sister     Social History   Tobacco Use   Smoking status: Never   Smokeless tobacco: Never  Vaping Use   Vaping status: Never Used  Substance Use Topics   Alcohol use: Not Currently    Alcohol/week: 14.0 standard drinks of alcohol    Types: 14 Cans of beer per week   Drug use: No    Prior to Admission medications   Medication Sig Start Date End Date Taking? Authorizing Provider  ARIPiprazole  (ABILIFY ) 2 MG tablet Take 1 tablet (2 mg total) by mouth in the morning. 08/22/23  Yes [name], MD  busPIRone  (BUSPAR ) 10 MG tablet Take 1 tablet (10 mg total) by mouth 3 (three) times daily. 09/16/23  Yes [name], MD  finasteride (PROSCAR) 5 MG tablet Take 5 mg by mouth daily. 11/06/23  Yes [provider]  folic acid  (FOLVITE ) 1 MG tablet Take 1 mg by mouth daily. 10/21/23  Yes [provider]  gabapentin  (NEURONTIN ) 100 MG capsule Take 1 capsule (100 mg total) by mouth 3  (three) times daily. 09/16/23  Yes [name], MD  melatonin 3 MG TABS tablet Take 1 tablet (3 mg total) by mouth at bedtime as needed (insomnia). Patient taking differently: Take 3 mg by mouth at bedtime. 07/19/23  Yes Rai, Ripudeep K, MD  mirtazapine  (REMERON ) 30 MG  tablet Take 1 tablet (30 mg total) by mouth at bedtime. 09/16/23  Yes [name], MD  pravastatin  (PRAVACHOL ) 20 MG tablet Take 1 tablet (20 mg total) by mouth at bedtime. 12/19/22  Yes [name], MD  primidone  (MYSOLINE ) 50 MG tablet Take 1 tablet (50 mg total) by mouth 2 (two) times daily. Patient taking differently: Take 50 mg by mouth 3 (three) times daily. 06/11/23  Yes Krishnan, Gokul, MD  tamsulosin  (FLOMAX ) 0.4 MG CAPS capsule TAKE 1 CAPSULE BY MOUTH EVERY DAY 07/23/23  Yes [name], MD  traZODone  (DESYREL ) 100 MG tablet Take 1 tablet (100 mg total) by mouth at bedtime. 07/19/23  Yes Rai, Ripudeep K, MD  venlafaxine  XR (EFFEXOR -XR) 150 MG 24 hr capsule Take 150 mg by mouth daily. 10/04/23  Yes [provider]  carvedilol  (COREG ) 12.5 MG tablet Take 12.5 mg by mouth 2 (two) times daily. Patient not taking: Reported on 12/04/2023 10/28/23   [provider]  ferrous sulfate  325 (65 FE) MG tablet Take 1 tablet (325 mg total) by mouth 2 (two) times daily with a meal. Patient not taking: Reported on 12/04/2023 07/19/23   Rai, Nydia POUR, MD  fluconazole  (DIFLUCAN ) 200 MG tablet Take 1 tablet (200 mg total) by mouth daily. Take 1 tablet on day one, then only half tablet daily until all tablets gone (15d total) Patient not taking: Reported on 12/04/2023 09/22/23   [name], MD  losartan  (COZAAR ) 50 MG tablet Take 50 mg by mouth daily. Patient not taking: Reported on 12/04/2023 10/28/23   [provider]  midodrine  (PROAMATINE ) 5 MG tablet Take 1 tablet (5 mg total) by mouth 3 (three) times daily with meals. Patient not taking: Reported on 12/04/2023 06/11/23   Krishnan, Gokul, MD  Na Sulfate-K  Sulfate-Mg Sulfate concentrate (SUPREP) 17.5-3.13-1.6 GM/177ML SOLN Take 1 kit by mouth once. Patient not taking: Reported on 12/03/2023 11/12/23   [provider]    Current Facility-Administered Medications  Medication Dose Route Frequency Provider Last Rate Last Admin   0.9 %  sodium chloride  infusion   Intravenous Continuous Claudene Reeves A, MD 75 mL/hr at 12/04/23 0659 New Bag at 12/04/23 0659   acetaminophen  (TYLENOL ) tablet 650 mg  650 mg Oral Q6H PRN Claudene Reeves LABOR, MD       Or   acetaminophen  (TYLENOL ) suppository 650 mg  650 mg Rectal Q6H PRN Claudene Reeves A, MD       ARIPiprazole  (ABILIFY ) tablet 2 mg  2 mg Oral q AM Smith, Rondell A, MD   2 mg at 12/04/23 9394   busPIRone  (BUSPAR ) tablet 10 mg  10 mg Oral TID Smith, Rondell A, MD   10 mg at 12/04/23 0950   carvedilol  (COREG ) tablet 12.5 mg  12.5 mg Oral BID Smith, Rondell A, MD   12.5 mg at 12/04/23 0950   Chlorhexidine  Gluconate Cloth 2 % PADS 6 each  6 each Topical Daily Claudene Reeves A, MD   6 each at 12/03/23 0641   finasteride (PROSCAR) tablet 5 mg  5 mg Oral Daily Smith, Rondell A, MD   5 mg at 12/04/23 0949   folic acid  (FOLVITE ) tablet 1 mg  1 mg Oral Daily Smith, Rondell A, MD   1 mg at 12/04/23 0950   gabapentin  (NEURONTIN ) capsule 100 mg  100 mg Oral TID Smith, Rondell A, MD   100 mg at 12/04/23 0949   melatonin tablet 3 mg  3 mg Oral QHS PRN  Claudene Maximino LABOR, MD   3 mg at 12/03/23 2217   micafungin (MYCAMINE) 100 mg in sodium chloride  0.9 % 100 mL IVPB  100 mg Intravenous Q24H Fleeta Rothman, Jomarie SAILOR, MD 105 mL/hr at 12/04/23 1125 100 mg at 12/04/23 1125   mirtazapine  (REMERON ) tablet 30 mg  30 mg Oral QHS Smith, Rondell A, MD   30 mg at 12/03/23 2217   pravastatin  (PRAVACHOL ) tablet 20 mg  20 mg Oral QHS Smith, Rondell A, MD   20 mg at 12/03/23 2216   primidone  (MYSOLINE ) tablet 50 mg  50 mg Oral Q8H Regalado, Belkys A, MD   50 mg at 12/04/23 1329   senna-docusate (Senokot-S) tablet 1 tablet  1 tablet Oral BID  Claudene Maximino LABOR, MD   1 tablet at 12/04/23 9050   sodium chloride  flush (NS) 0.9 % injection 3 mL  3 mL Intravenous Q12H Smith, Rondell A, MD   3 mL at 12/04/23 1108   tamsulosin  (FLOMAX ) capsule 0.4 mg  0.4 mg Oral Daily Claudene Maximino A, MD   0.4 mg at 12/04/23 9050   traZODone  (DESYREL ) tablet 100 mg  100 mg Oral QHS Regalado, Belkys A, MD       venlafaxine  XR (EFFEXOR -XR) 24 hr capsule 150 mg  150 mg Oral Daily Claudene Maximino A, MD   150 mg at 12/04/23 0950    Allergies as of 12/02/2023 - Review Complete 12/02/2023  Allergen Reaction Noted   Albuterol  Palpitations 06/05/2023     Review of Systems:    Constitutional: No weight loss, fever or chills Skin: No rash Cardiovascular: No chest pain Respiratory: No SOB Gastrointestinal: See HPI and otherwise negative Genitourinary: No dysuria  Neurological: No headache, dizziness or syncope Musculoskeletal: No new muscle or joint pain Hematologic: No bleeding  Psychiatric: No history of depression or anxiety    Physical Exam:  Vital signs in last 24 hours: Temp:  [97.7 F (36.5 C)-98.5 F (36.9 C)] 98.5 F (36.9 C) (07/02 1212) Pulse Rate:  [74-97] 74 (07/02 1212) Resp:  [18-19] 19 (07/02 1212) BP: (101-135)/(55-90) 101/55 (07/02 1212) SpO2:  [93 %-98 %] 96 % (07/02 1212) Last BM Date : 12/03/23 General:   Pleasant elderly Caucasian male appears to be in NAD, Well developed, Well nourished, alert and cooperative Head:  Normocephalic and atraumatic. Eyes:   PEERL, EOMI. No icterus. Conjunctiva pink. Ears:  Normal auditory acuity. Neck:  Supple Throat: Oral cavity and pharynx without inflammation, swelling or lesion. Lungs: Respirations even and unlabored. Lungs clear to auscultation bilaterally.   No wheezes, crackles, or rhonchi.  Heart: Normal S1, S2. No MRG. Regular rate and rhythm. No peripheral edema, cyanosis or pallor.  Abdomen:  Soft, nondistended, nontender. No rebound or guarding. Normal bowel sounds. No appreciable  masses or hepatomegaly. Rectal:  Not performed.  Msk:  Symmetrical without gross deformities. Peripheral pulses intact.  Extremities:  Without edema, no deformity or joint abnormality. Normal ROM, normal sensation. Neurologic:  Alert and  oriented x4;  grossly normal neurologically. Skin:   Dry and intact without significant lesions or rashes. Psychiatric: Demonstrates good judgement and reason without abnormal affect or behaviors.  LAB RESULTS: Recent Labs    12/02/23 0523 12/03/23 0429 12/04/23 0429  WBC 8.5 8.4 9.6  HGB 10.7* 9.4* 9.6*  HCT 34.7* 30.0* 30.2*  PLT 200 187 184   BMET Recent Labs    12/02/23 0523 12/03/23 0429 12/04/23 0429  NA 137 137 135  K 4.2 3.7 4.0  CL 107  108 105  CO2 22 24 21*  GLUCOSE 117* 109* 114*  BUN 29* 25* 29*  CREATININE 1.88* 1.81* 1.86*  CALCIUM  8.9 8.3* 8.3*     Impression / Plan:   Impression: 1.  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  Colovesicular fistula: we are being consulted for inpatient colonoscopy to speed up evaluation for general surgery to address the colovesicular fistula that is undoubtedly seeding his bladder with bacteria and fungi 3.  Dementia 4.  History of C. difficile colitis 5.  BPH and history of urethral stricture and issues with obstructive uropathy 6.  Acute on chronic CKD stage III 7.  Anemia of chronic disease  Plan: 1.  Patient would benefit from colonoscopy here.  Will let him be evaluated by Dr. Leigh to decide on timing.  Does not seem like he has anything going on that would hinder us  from doing this within the coming days. 2.  Continue other supportive measures 3.  Please await further recommendations from Dr. Leigh.  Thank you for your kind consultation, we will continue to follow.  Delon Gibson [facility]  12/04/2023, 2:43 PM

## 2023-12-04 NOTE — Plan of Care (Signed)
  Problem: Education: Goal: Knowledge of General Education information will improve Description: Including pain rating scale, medication(s)/side effects and non-pharmacologic comfort measures Outcome: Progressing   Problem: Clinical Measurements: Goal: Ability to maintain clinical measurements within normal limits will improve Outcome: Progressing Goal: Will remain free from infection Outcome: Progressing   Problem: Activity: Goal: Risk for activity intolerance will decrease Outcome: Progressing   Problem: Pain Managment: Goal: General experience of comfort will improve and/or be controlled Outcome: Progressing   Problem: Safety: Goal: Ability to remain free from injury will improve Outcome: Progressing   Problem: Skin Integrity: Goal: Risk for impaired skin integrity will decrease Outcome: Progressing

## 2023-12-04 NOTE — H&P (View-Only) (Signed)
 Consultation  Referring Provider:   Dr. Fransisca Primary Care Physician:  [name], MD Primary Gastroenterologist: Dr. Legrand        Reason for Consultation: Bacteremia         HPI:   Calvin Escobar is a 73 y.o. male with a past medical history as listed below including prior alcohol abuse, dementia, dilated cardiomyopathy, prior C. difficile colitis, chronic colovesicular fistula and multiple others, who presented to the hospital after being evaluated by ID with a culture positive for C albicans.  We were called in regards to further workup for this bacteremia.    Patient seen in the hospital last week for urinary retention and underwent bedside cystoscopy with urethral cystourethroscope to address a short segment high stricture with large prostate trabeculated bladder and placement of Foley catheter.    Patient started on Micafungin in the ED.    Today, patient tells me that he is really just here because they found fungus in my blood.  He really feels okay.  No abdominal pain, diarrhea or GI complaints.  His biggest trouble I am in the room is that he is spilled his urine from his Foley catheter all over the floor when trying to get up to go to the bathroom.  He denies any abdominal pain, nausea, vomiting or other GI symptoms.  He feels fairly well at the moment.  He is eager to get his colonoscopy done while he is in the hospital so that he can move forward with repair of this colovesicular fistula.    Denies fever, chills or weight loss.     Hospital course: 12/02/2023 CTAP with marked bladder wall thickening with surrounding edema and left-sided hydronephrosis with suspected tract from the sigmoid colon to the posterior bladder concerning for chronic colovesicular fistula  GI history: Colonoscopy-scheduled for 12/23/2023 11/13/2023 patient retreated for recurrent C. difficile from inpatient February of this year, with Dificid  Past Medical History:  Diagnosis Date    Acute cystitis 05/13/2023   Acute hyponatremia 05/13/2023   Acute metabolic encephalopathy 07/12/2023   Acute respiratory failure with hypoxia (HCC) 06/02/2023   AKI (acute kidney injury) (HCC) 05/13/2023   Allergy seasonal   Ascending aorta dilatation (HCC)    38 mm by 2D echo 04/2021   Cancer (HCC) skin   Candidal UTI (urinary tract infection) 09/22/2023   CLINICAL CONTEXT: 73 year old male with multiple inflammatory conditions including recurrent UTIs, Stage 3 CKD, and 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 History of GI bleeding (07/2023). Currently on ferrous sulfate  supplementation.   DIAGNOSTIC ASSESSMENT: ? Primary etiology (70-80% probability): Anemia of chronic disease/inflammation ? Secondary considerations: Iron deficiency component (elevated RD   DCM (dilated cardiomyopathy) (HCC)    nonischemic with normal coronary arteries at cath 06/2019.  EF 35 to 40% on echo 04/2021   Dementia (HCC)    Depression    Diarrhea 05/13/2023   E coli bacteremia 05/14/2023   High anion gap metabolic acidosis 07/13/2023   History of Clostridioides difficile colitis 08/15/2023   Associated with colovesical fistula? 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  Endoscopy esophageal stricture 2016     Septic shock from UTI 06/02/2023   Severe sepsis (HCC) 06/03/2023   SKIN CANCER, HX OF 05/10/2007   Facial left check and forehead and r shoulder  F/w derm   Syncope 05/13/2023   Thrombocytopenia (HCC) 06/02/2023   Lab Results      Component    Value    Date           PLT    301    07/19/2023           PLT    324    07/18/2023           PLT    268    07/16/2023           PLT    207    07/14/2023           PLT    228    07/13/2023             Lab Results      Component    Value    Date           ESRSEDRATE    60 (H)    05/08/2023     No  results found for: CRP          Lab Results      Component    Value       Urinary catheter complication (HCC) 06/24/2023   Urinary dribbling 03/14/2022   Urinary incontinence 11/21/2022      Urinary Incontinence: They are wearing disposable diapers daily due to urinary incontinence, primarily nocturnal, and are currently on Tamsulosin  (Flomax ). We will refer them to Urology for further evaluation and potential treatment options and continue Tamsulosin  until they are seen by Urology.     Urinary tract infection without hematuria 06/02/2023   UTI (urinary tract infection) 07/01/2023    Past Surgical History:  Procedure Laterality Date   COLONOSCOPY     RIGHT/LEFT HEART CATH AND CORONARY ANGIOGRAPHY N/A 07/03/2019   Procedure: RIGHT/LEFT HEART CATH AND CORONARY ANGIOGRAPHY;  Surgeon: Cherrie Toribio SAUNDERS, MD;  Location: [facility];  Service: Cardiovascular;  Laterality: N/A;    Family History  Problem Relation Age of Onset   Hypertension Mother    Cancer Father    Bone cancer Sister     Social History   Tobacco Use   Smoking status: Never   Smokeless tobacco: Never  Vaping Use   Vaping status: Never Used  Substance Use Topics   Alcohol use: Not Currently    Alcohol/week: 14.0 standard drinks of alcohol    Types: 14 Cans of beer per week   Drug use: No    Prior to Admission medications   Medication Sig Start Date End Date Taking? Authorizing Provider  ARIPiprazole  (ABILIFY ) 2 MG tablet Take 1 tablet (2 mg total) by mouth in the morning. 08/22/23  Yes [name], MD  busPIRone  (BUSPAR ) 10 MG tablet Take 1 tablet (10 mg total) by mouth 3 (three) times daily. 09/16/23  Yes [name], MD  finasteride (PROSCAR) 5 MG tablet Take 5 mg by mouth daily. 11/06/23  Yes [provider]  folic acid  (FOLVITE ) 1 MG tablet Take 1 mg by mouth daily. 10/21/23  Yes [provider]  gabapentin  (NEURONTIN ) 100 MG capsule Take 1 capsule (100 mg total) by mouth 3  (three) times daily. 09/16/23  Yes [name], MD  melatonin 3 MG TABS tablet Take 1 tablet (3 mg total) by mouth at bedtime as needed (insomnia). Patient taking differently: Take 3 mg by mouth at bedtime. 07/19/23  Yes Rai, Ripudeep K, MD  mirtazapine  (REMERON ) 30 MG  tablet Take 1 tablet (30 mg total) by mouth at bedtime. 09/16/23  Yes [name], MD  pravastatin  (PRAVACHOL ) 20 MG tablet Take 1 tablet (20 mg total) by mouth at bedtime. 12/19/22  Yes [name], MD  primidone  (MYSOLINE ) 50 MG tablet Take 1 tablet (50 mg total) by mouth 2 (two) times daily. Patient taking differently: Take 50 mg by mouth 3 (three) times daily. 06/11/23  Yes Krishnan, Gokul, MD  tamsulosin  (FLOMAX ) 0.4 MG CAPS capsule TAKE 1 CAPSULE BY MOUTH EVERY DAY 07/23/23  Yes [name], MD  traZODone  (DESYREL ) 100 MG tablet Take 1 tablet (100 mg total) by mouth at bedtime. 07/19/23  Yes Rai, Ripudeep K, MD  venlafaxine  XR (EFFEXOR -XR) 150 MG 24 hr capsule Take 150 mg by mouth daily. 10/04/23  Yes [provider]  carvedilol  (COREG ) 12.5 MG tablet Take 12.5 mg by mouth 2 (two) times daily. Patient not taking: Reported on 12/04/2023 10/28/23   [provider]  ferrous sulfate  325 (65 FE) MG tablet Take 1 tablet (325 mg total) by mouth 2 (two) times daily with a meal. Patient not taking: Reported on 12/04/2023 07/19/23   Rai, Nydia POUR, MD  fluconazole  (DIFLUCAN ) 200 MG tablet Take 1 tablet (200 mg total) by mouth daily. Take 1 tablet on day one, then only half tablet daily until all tablets gone (15d total) Patient not taking: Reported on 12/04/2023 09/22/23   [name], MD  losartan  (COZAAR ) 50 MG tablet Take 50 mg by mouth daily. Patient not taking: Reported on 12/04/2023 10/28/23   [provider]  midodrine  (PROAMATINE ) 5 MG tablet Take 1 tablet (5 mg total) by mouth 3 (three) times daily with meals. Patient not taking: Reported on 12/04/2023 06/11/23   Krishnan, Gokul, MD  Na Sulfate-K  Sulfate-Mg Sulfate concentrate (SUPREP) 17.5-3.13-1.6 GM/177ML SOLN Take 1 kit by mouth once. Patient not taking: Reported on 12/03/2023 11/12/23   [provider]    Current Facility-Administered Medications  Medication Dose Route Frequency Provider Last Rate Last Admin   0.9 %  sodium chloride  infusion   Intravenous Continuous Claudene Reeves A, MD 75 mL/hr at 12/04/23 0659 New Bag at 12/04/23 0659   acetaminophen  (TYLENOL ) tablet 650 mg  650 mg Oral Q6H PRN Claudene Reeves LABOR, MD       Or   acetaminophen  (TYLENOL ) suppository 650 mg  650 mg Rectal Q6H PRN Claudene Reeves A, MD       ARIPiprazole  (ABILIFY ) tablet 2 mg  2 mg Oral q AM Smith, Rondell A, MD   2 mg at 12/04/23 9394   busPIRone  (BUSPAR ) tablet 10 mg  10 mg Oral TID Smith, Rondell A, MD   10 mg at 12/04/23 0950   carvedilol  (COREG ) tablet 12.5 mg  12.5 mg Oral BID Smith, Rondell A, MD   12.5 mg at 12/04/23 0950   Chlorhexidine  Gluconate Cloth 2 % PADS 6 each  6 each Topical Daily Claudene Reeves A, MD   6 each at 12/03/23 0641   finasteride (PROSCAR) tablet 5 mg  5 mg Oral Daily Smith, Rondell A, MD   5 mg at 12/04/23 0949   folic acid  (FOLVITE ) tablet 1 mg  1 mg Oral Daily Smith, Rondell A, MD   1 mg at 12/04/23 0950   gabapentin  (NEURONTIN ) capsule 100 mg  100 mg Oral TID Smith, Rondell A, MD   100 mg at 12/04/23 0949   melatonin tablet 3 mg  3 mg Oral QHS PRN  Claudene Maximino LABOR, MD   3 mg at 12/03/23 2217   micafungin (MYCAMINE) 100 mg in sodium chloride  0.9 % 100 mL IVPB  100 mg Intravenous Q24H Fleeta Rothman, Jomarie SAILOR, MD 105 mL/hr at 12/04/23 1125 100 mg at 12/04/23 1125   mirtazapine  (REMERON ) tablet 30 mg  30 mg Oral QHS Smith, Rondell A, MD   30 mg at 12/03/23 2217   pravastatin  (PRAVACHOL ) tablet 20 mg  20 mg Oral QHS Smith, Rondell A, MD   20 mg at 12/03/23 2216   primidone  (MYSOLINE ) tablet 50 mg  50 mg Oral Q8H Regalado, Belkys A, MD   50 mg at 12/04/23 1329   senna-docusate (Senokot-S) tablet 1 tablet  1 tablet Oral BID  Claudene Maximino LABOR, MD   1 tablet at 12/04/23 9050   sodium chloride  flush (NS) 0.9 % injection 3 mL  3 mL Intravenous Q12H Smith, Rondell A, MD   3 mL at 12/04/23 1108   tamsulosin  (FLOMAX ) capsule 0.4 mg  0.4 mg Oral Daily Claudene Maximino A, MD   0.4 mg at 12/04/23 9050   traZODone  (DESYREL ) tablet 100 mg  100 mg Oral QHS Regalado, Belkys A, MD       venlafaxine  XR (EFFEXOR -XR) 24 hr capsule 150 mg  150 mg Oral Daily Claudene Maximino A, MD   150 mg at 12/04/23 0950    Allergies as of 12/02/2023 - Review Complete 12/02/2023  Allergen Reaction Noted   Albuterol  Palpitations 06/05/2023     Review of Systems:    Constitutional: No weight loss, fever or chills Skin: No rash Cardiovascular: No chest pain Respiratory: No SOB Gastrointestinal: See HPI and otherwise negative Genitourinary: No dysuria  Neurological: No headache, dizziness or syncope Musculoskeletal: No new muscle or joint pain Hematologic: No bleeding  Psychiatric: No history of depression or anxiety    Physical Exam:  Vital signs in last 24 hours: Temp:  [97.7 F (36.5 C)-98.5 F (36.9 C)] 98.5 F (36.9 C) (07/02 1212) Pulse Rate:  [74-97] 74 (07/02 1212) Resp:  [18-19] 19 (07/02 1212) BP: (101-135)/(55-90) 101/55 (07/02 1212) SpO2:  [93 %-98 %] 96 % (07/02 1212) Last BM Date : 12/03/23 General:   Pleasant elderly Caucasian male appears to be in NAD, Well developed, Well nourished, alert and cooperative Head:  Normocephalic and atraumatic. Eyes:   PEERL, EOMI. No icterus. Conjunctiva pink. Ears:  Normal auditory acuity. Neck:  Supple Throat: Oral cavity and pharynx without inflammation, swelling or lesion. Lungs: Respirations even and unlabored. Lungs clear to auscultation bilaterally.   No wheezes, crackles, or rhonchi.  Heart: Normal S1, S2. No MRG. Regular rate and rhythm. No peripheral edema, cyanosis or pallor.  Abdomen:  Soft, nondistended, nontender. No rebound or guarding. Normal bowel sounds. No appreciable  masses or hepatomegaly. Rectal:  Not performed.  Msk:  Symmetrical without gross deformities. Peripheral pulses intact.  Extremities:  Without edema, no deformity or joint abnormality. Normal ROM, normal sensation. Neurologic:  Alert and  oriented x4;  grossly normal neurologically. Skin:   Dry and intact without significant lesions or rashes. Psychiatric: Demonstrates good judgement and reason without abnormal affect or behaviors.  LAB RESULTS: Recent Labs    12/02/23 0523 12/03/23 0429 12/04/23 0429  WBC 8.5 8.4 9.6  HGB 10.7* 9.4* 9.6*  HCT 34.7* 30.0* 30.2*  PLT 200 187 184   BMET Recent Labs    12/02/23 0523 12/03/23 0429 12/04/23 0429  NA 137 137 135  K 4.2 3.7 4.0  CL 107  108 105  CO2 22 24 21*  GLUCOSE 117* 109* 114*  BUN 29* 25* 29*  CREATININE 1.88* 1.81* 1.86*  CALCIUM  8.9 8.3* 8.3*     Impression / Plan:   Impression: 1.  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  Colovesicular fistula: we are being consulted for inpatient colonoscopy to speed up evaluation for general surgery to address the colovesicular fistula that is undoubtedly seeding his bladder with bacteria and fungi 3.  Dementia 4.  History of C. difficile colitis 5.  BPH and history of urethral stricture and issues with obstructive uropathy 6.  Acute on chronic CKD stage III 7.  Anemia of chronic disease  Plan: 1.  Patient would benefit from colonoscopy here.  Will let him be evaluated by Dr. Leigh to decide on timing.  Does not seem like he has anything going on that would hinder us  from doing this within the coming days. 2.  Continue other supportive measures 3.  Please await further recommendations from Dr. Leigh.  Thank you for your kind consultation, we will continue to follow.  Delon Gibson [facility]  12/04/2023, 2:43 PM

## 2023-12-04 NOTE — Plan of Care (Signed)

## 2023-12-04 NOTE — Progress Notes (Signed)
 PROGRESS NOTE    Calvin Escobar  FMW:[id number] DOB: 1951/01/17 DOA: 12/02/2023 PCP: [name], MD   Brief Narrative: 73 year old with medical history significant for hyperlipidemia, heart failure with  impaired ejection fraction, history of colovesical fistula, BPH with urinary retention, anemia of chronic disease, depression, bipolar disorder, dementia presents complaining of blood in his urine and discomfort.  Patient was seen in the ED 2 days prior to admission for urine retention, urology placed Foley catheter.  Blood cultures obtained on 6/18 came back positive for Candida.  Patient presented for further evaluation.   Assessment & Plan:   Principal Problem:   Candidemia (HCC) Active Problems:   Hematuria   BPH with urinary obstruction   Acute kidney injury superimposed on chronic kidney disease (HCC)   Anemia of chronic disease   Dementia (HCC)   Anxiety   Bipolar disorder (HCC)   HLD (hyperlipidemia)   Colovesical fistula   Obesity (BMI 30-39.9)  1-Candidemia:  -Urinary Source.  -Patient blood culture obtain ED visit 6/18 came back positive for candida.  -ID has been consulted. Repeated Blood culture: No growth to date.  -Continue with Micafungi.  -Appreciate Dr Lavonia (ophthalmologist ) evaluation. No evidence of ocular involvement.  -Awaiting Sensitivity for candida.   Hematuria BPH with urinary retention Suspected   chronic colovesical fistula. -Patient developed urinary retention secondary to BPH and urethral stricture recently had Foley catheter placed by urology 2 days prior to admission. -Hematuria, improving.  -CT abdomen and pelvis: Marked bladder wall thickening with surrounding edema and fluid, indicative of severe cystitis. Associated mild left hydronephrosis. Suspected tract from the sigmoid colon to the posterior bladder wall, worrisome for a chronic colovesical fistula. -Urology consulted. Recommend follow up out patient. He will needs to be  discharge with Foley.  In regards to colovesical fistula urology recommend follow up with colo-rectal surgeon.  I have consulted GI to see if we can expedite colonoscopy.   Acute on chronic CKD stage IIIa - Previous creatinine baseline 1.0----1.5 - Present with a creatinine 1.8-- stable.  -Continue with IV fluids  Anemia of chronic disease: - Continue to monitor hemoglobin Hb stable.   Dementia: - Delirium  precaution  Anxiety/bipolar disorder - Continue Abilify , BuSpar , mirtazapine  and venlafaxine   Hyperlipidemia - Continue pravastatin   History of colovesical fistula CV fistula with recurrent bacteruria and few pyelo admissions. IN process of eval for repair by general surgery. Colonoscopy pending with Dr. Legrand of GI 12/23/23.   Obesity: BMI 32     Estimated body mass index is 32.29 kg/m as calculated from the following:   Height as of this encounter: 6' (1.829 m).   Weight as of this encounter: 108 kg.   DVT prophylaxis: SCD due to hematuria Code Status: DNR Family Communication: no family at beside Disposition Plan:  Status is: Inpatient Remains inpatient appropriate because: management of candidemia    Consultants:  ID Urology   Procedures:    Antimicrobials:    Subjective: Sitting in bed, no new complaints, denies pain.  He is suppose to have colonoscopy next week.    Objective: Vitals:   12/03/23 1932 12/04/23 0017 12/04/23 0511 12/04/23 0836  BP: 116/80 121/69 (!) 117/90 135/81  Pulse: 97 75 96 74  Resp: 19 18 18 19   Temp: 98.3 F (36.8 C) 98.4 F (36.9 C) 97.7 F (36.5 C) 97.9 F (36.6 C)  TempSrc:      SpO2: 98% 93% 98% 96%  Weight:      Height:  Intake/Output Summary (Last 24 hours) at 12/04/2023 1034 Last data filed at 12/04/2023 0500 Gross per 24 hour  Intake 1214.83 ml  Output 900 ml  Net 314.83 ml   Filed Weights   12/02/23 0520  Weight: 108 kg    Examination:  General exam: NAD Respiratory system:  CTA Cardiovascular system: S 1, S 2 RRR Gastrointestinal system: BS present, sof,t nt Central nervous system: alert Extremities: Symmetric 5 x 5 power.   Data Reviewed: I have personally reviewed following labs and imaging studies  CBC: Recent Labs  Lab 11/30/23 0941 12/02/23 0523 12/03/23 0429 12/04/23 0429  WBC 5.8 8.5 8.4 9.6  NEUTROABS 3.0  --   --   --   HGB 9.6* 10.7* 9.4* 9.6*  HCT 32.7* 34.7* 30.0* 30.2*  MCV 98.2 93.0 91.5 91.0  PLT 177 200 187 184   Basic Metabolic Panel: Recent Labs  Lab 11/27/23 1553 11/30/23 0941 12/02/23 0523 12/03/23 0429 12/04/23 0429  NA 141 139 137 137 135  K 4.8 5.0 4.2 3.7 4.0  CL 104 106 107 108 105  CO2 29 25 22 24  21*  GLUCOSE 105* 81 117* 109* 114*  BUN 33* 31* 29* 25* 29*  CREATININE 1.89* 1.78* 1.88* 1.81* 1.86*  CALCIUM  9.0 8.9 8.9 8.3* 8.3*   GFR: Estimated Creatinine Clearance: 43.6 mL/min (A) (by C-G formula based on SCr of 1.86 mg/dL (H)). Liver Function Tests: Recent Labs  Lab 11/30/23 0941  AST 11*  ALT 5  ALKPHOS 80  BILITOT <0.2  PROT 7.2  ALBUMIN  3.6   No results for input(s): LIPASE, AMYLASE in the last 168 hours. No results for input(s): AMMONIA in the last 168 hours. Coagulation Profile: No results for input(s): INR, PROTIME in the last 168 hours. Cardiac Enzymes: No results for input(s): CKTOTAL, CKMB, CKMBINDEX, TROPONINI in the last 168 hours. BNP (last 3 results) No results for input(s): PROBNP in the last 8760 hours. HbA1C: No results for input(s): HGBA1C in the last 72 hours. CBG: Recent Labs  Lab 12/03/23 1735  GLUCAP 139*   Lipid Profile: No results for input(s): CHOL, HDL, LDLCALC, TRIG, CHOLHDL, LDLDIRECT in the last 72 hours. Thyroid  Function Tests: No results for input(s): TSH, T4TOTAL, FREET4, T3FREE, THYROIDAB in the last 72 hours. Anemia Panel: No results for input(s): VITAMINB12, FOLATE, FERRITIN, TIBC, IRON,  RETICCTPCT in the last 72 hours. Sepsis Labs: Recent Labs  Lab 12/02/23 0803 12/02/23 1019  LATICACIDVEN 0.7 0.6    Recent Results (from the past 240 hours)  Blood culture (routine x 2)     Status: None (Preliminary result)   Collection Time: 12/02/23  7:40 AM   Specimen: BLOOD LEFT ARM  Result Value Ref Range Status   Specimen Description BLOOD LEFT ARM  Final   Special Requests   Final    BOTTLES DRAWN AEROBIC AND ANAEROBIC Blood Culture results may not be optimal due to an inadequate volume of blood received in culture bottles   Culture   Final    NO GROWTH 2 DAYS Performed at [facility], [address] [place], [place] [address]    Report Status PENDING  Incomplete  Blood culture (routine x 2)     Status: None (Preliminary result)   Collection Time: 12/02/23  7:41 AM   Specimen: BLOOD  Result Value Ref Range Status   Specimen Description BLOOD RIGHT ANTECUBITAL  Final   Special Requests   Final    BOTTLES DRAWN AEROBIC AND ANAEROBIC Blood Culture adequate  volume   Culture   Final    NO GROWTH 2 DAYS Performed at [facility], [address], [place], [place] [address]    Report Status PENDING  Incomplete  Fungus culture, blood     Status: None (Preliminary result)   Collection Time: 12/02/23  2:08 PM   Specimen: BLOOD  Result Value Ref Range Status   Specimen Description BLOOD SITE NOT SPECIFIED  Final   Special Requests   Final    BOTTLES DRAWN AEROBIC ONLY Blood Culture adequate volume   Culture   Final    NO GROWTH 2 DAYS Performed at [facility], [address], [name], [place] [address]    Report Status PENDING  Incomplete         Radiology Studies: No results found.       Scheduled Meds:  ARIPiprazole   2 mg Oral q AM   busPIRone   10 mg Oral TID   carvedilol   12.5 mg Oral BID   Chlorhexidine  Gluconate Cloth  6 each Topical Daily   finasteride  5 mg Oral Daily   folic acid   1 mg Oral Daily   gabapentin   100 mg Oral  TID   mirtazapine   30 mg Oral QHS   pravastatin   20 mg Oral QHS   primidone   50 mg Oral Q8H   senna-docusate  1 tablet Oral BID   sodium chloride  flush  3 mL Intravenous Q12H   tamsulosin   0.4 mg Oral Daily   traZODone   100 mg Oral QHS   venlafaxine  XR  150 mg Oral Daily   Continuous Infusions:  sodium chloride  75 mL/hr at 12/04/23 0659   micafungin (MYCAMINE) 100 mg in sodium chloride  0.9 % 100 mL IVPB Stopped (12/03/23 1026)     LOS: 2 days    Time spent: 35 minutes    Rasul Decola A Zeyad Delaguila, MD [facility]   If 7PM-7AM, please contact night-coverage [email or web address]  12/04/2023, 10:34 AM

## 2023-12-04 NOTE — Progress Notes (Signed)
 Subjective: No new complaints   Antibiotics:  Anti-infectives (From admission, onward)    Start     Dose/Rate Route Frequency Ordered Stop   12/02/23 1000  micafungin (MYCAMINE) 100 mg in sodium chloride  0.9 % 100 mL IVPB        100 mg 105 mL/hr over 1 Hours Intravenous Every 24 hours 12/02/23 0946         Medications: Scheduled Meds:  ARIPiprazole   2 mg Oral q AM   busPIRone   10 mg Oral TID   carvedilol   12.5 mg Oral BID   Chlorhexidine  Gluconate Cloth  6 each Topical Daily   finasteride  5 mg Oral Daily   folic acid   1 mg Oral Daily   gabapentin   100 mg Oral TID   mirtazapine   30 mg Oral QHS   pravastatin   20 mg Oral QHS   primidone   50 mg Oral Q8H   senna-docusate  1 tablet Oral BID   sodium chloride  flush  3 mL Intravenous Q12H   tamsulosin   0.4 mg Oral Daily   traZODone   100 mg Oral QHS   venlafaxine  XR  150 mg Oral Daily   Continuous Infusions:  sodium chloride  75 mL/hr at 12/04/23 0659   micafungin (MYCAMINE) 100 mg in sodium chloride  0.9 % 100 mL IVPB 100 mg (12/04/23 1125)   PRN Meds:.acetaminophen  **OR** acetaminophen , melatonin    Objective: Weight change:   Intake/Output Summary (Last 24 hours) at 12/04/2023 1146 Last data filed at 12/04/2023 0500 Gross per 24 hour  Intake 1214.83 ml  Output 900 ml  Net 314.83 ml   Blood pressure 135/81, pulse 74, temperature 97.9 F (36.6 C), resp. rate 19, height 6' (1.829 m), weight 108 kg, SpO2 96%. Temp:  [97.7 F (36.5 C)-98.4 F (36.9 C)] 97.9 F (36.6 C) (07/02 0836) Pulse Rate:  [74-97] 74 (07/02 0836) Resp:  [18-19] 19 (07/02 0836) BP: (114-135)/(69-90) 135/81 (07/02 0836) SpO2:  [93 %-98 %] 96 % (07/02 0836)  Physical Exam: Physical Exam Constitutional:      Appearance: He is well-developed.  HENT:     Head: Normocephalic and atraumatic.  Eyes:     Conjunctiva/sclera: Conjunctivae normal.  Cardiovascular:     Rate and Rhythm: Normal rate and regular rhythm.  Pulmonary:      Effort: Pulmonary effort is normal. No respiratory distress.     Breath sounds: No wheezing.  Abdominal:     General: There is no distension.     Palpations: Abdomen is soft.  Musculoskeletal:        General: Normal range of motion.     Cervical back: Normal range of motion and neck supple.  Skin:    General: Skin is warm and dry.     Findings: No erythema or rash.  Neurological:     General: No focal deficit present.     Mental Status: He is alert and oriented to person, place, and time.  Psychiatric:        Mood and Affect: Mood normal.        Behavior: Behavior normal.        Thought Content: Thought content normal.        Judgment: Judgment normal.      CBC:    BMET Recent Labs    12/03/23 0429 12/04/23 0429  NA 137 135  K 3.7 4.0  CL 108 105  CO2 24 21*  GLUCOSE 109* 114*  BUN 25* 29*  CREATININE 1.81* 1.86*  CALCIUM  8.3* 8.3*     Liver Panel  No results for input(s): PROT, ALBUMIN , AST, ALT, ALKPHOS, BILITOT, BILIDIR, IBILI in the last 72 hours.     Sedimentation Rate No results for input(s): ESRSEDRATE in the last 72 hours. C-Reactive Protein Recent Labs    12/02/23 1407  CRP 5.9*    Micro Results: Recent Results (from the past 720 hours)  Fungus culture, blood     Status: Abnormal   Collection Time: 11/20/23  1:28 PM   Specimen: Blood   BLD  Result Value Ref Range Status   Fungus (Mycology) Culture Final report (A)  Final   Fungal result 1 Candida albicans (A)  Final    Comment: Heavy growth  Urine Culture     Status: None   Collection Time: 11/20/23  2:28 PM   Specimen: Urine  Result Value Ref Range Status   MICRO NUMBER: [id number]  Final   SPECIMEN QUALITY: Adequate  Final   Sample Source URINE  Final   STATUS: FINAL  Final   Result: No Growth  Final  Blood culture (routine x 2)     Status: None (Preliminary result)   Collection Time: 12/02/23  7:40 AM   Specimen: BLOOD LEFT ARM  Result Value Ref Range Status    Specimen Description BLOOD LEFT ARM  Final   Special Requests   Final    BOTTLES DRAWN AEROBIC AND ANAEROBIC Blood Culture results may not be optimal due to an inadequate volume of blood received in culture bottles   Culture   Final    NO GROWTH 2 DAYS Performed at [facility], [address] [place], [place] [address]    Report Status PENDING  Incomplete  Blood culture (routine x 2)     Status: None (Preliminary result)   Collection Time: 12/02/23  7:41 AM   Specimen: BLOOD  Result Value Ref Range Status   Specimen Description BLOOD RIGHT ANTECUBITAL  Final   Special Requests   Final    BOTTLES DRAWN AEROBIC AND ANAEROBIC Blood Culture adequate volume   Culture   Final    NO GROWTH 2 DAYS Performed at [facility], [address] [place], [place] [address]    Report Status PENDING  Incomplete  Fungus culture, blood     Status: None (Preliminary result)   Collection Time: 12/02/23  2:08 PM   Specimen: BLOOD  Result Value Ref Range Status   Specimen Description BLOOD SITE NOT SPECIFIED  Final   Special Requests   Final    BOTTLES DRAWN AEROBIC ONLY Blood Culture adequate volume   Culture   Final    NO GROWTH 2 DAYS Performed at [facility], [address], [place], [place] [address]    Report Status PENDING  Incomplete    Studies/Results: No results found.     Assessment/Plan:  INTERVAL HISTORY:  GI to be consulted with regards to trying to get his colonoscopy done while inpatient to speed up evaluation for surgery to address his colovesicular fistula  Principal Problem:   Candidemia (HCC) Active Problems:   Dementia (HCC)   HLD (hyperlipidemia)   Anemia of chronic disease   Acute kidney injury superimposed on chronic kidney disease (HCC)   Colovesical fistula   Hematuria   BPH with urinary obstruction   Anxiety   Bipolar disorder (HCC)   Obesity (BMI 30-39.9)    Calvin Escobar is a 73 y.o. 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  #1 Candidemia:  Will continue with micafungin. Await sensitivity data  Sensitivity data will likely not be back for some time.  #2 colovesicular fistula:  I agree with the idea of discussing a GI inpatient colonoscopy to speed up evaluation for general surgery to address the colovesicular fistula that is undoubtedly seeding his bladder with bacteria and fungi.   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    LOS: 2 days   Calvin Escobar 12/04/2023, 11:46 AM

## 2023-12-05 ENCOUNTER — Encounter (HOSPITAL_COMMUNITY): Payer: Self-pay | Admitting: Internal Medicine

## 2023-12-05 ENCOUNTER — Inpatient Hospital Stay (HOSPITAL_COMMUNITY)

## 2023-12-05 ENCOUNTER — Encounter (HOSPITAL_COMMUNITY)
Admission: EM | Disposition: A | Discharge status: Home / Self Care | Payer: Self-pay | Source: Home / Self Care | Attending: Internal Medicine

## 2023-12-05 DIAGNOSIS — K648 Other hemorrhoids: Secondary | ICD-10-CM | POA: Diagnosis not present

## 2023-12-05 DIAGNOSIS — B377 Candidal sepsis: Secondary | ICD-10-CM | POA: Diagnosis not present

## 2023-12-05 DIAGNOSIS — I130 Hypertensive heart and chronic kidney disease with heart failure and stage 1 through stage 4 chronic kidney disease, or unspecified chronic kidney disease: Secondary | ICD-10-CM

## 2023-12-05 DIAGNOSIS — N321 Vesicointestinal fistula: Secondary | ICD-10-CM

## 2023-12-05 DIAGNOSIS — K5730 Diverticulosis of large intestine without perforation or abscess without bleeding: Secondary | ICD-10-CM | POA: Diagnosis not present

## 2023-12-05 DIAGNOSIS — I5020 Unspecified systolic (congestive) heart failure: Secondary | ICD-10-CM

## 2023-12-05 DIAGNOSIS — N1831 Chronic kidney disease, stage 3a: Secondary | ICD-10-CM

## 2023-12-05 HISTORY — PX: COLONOSCOPY: SHX5424

## 2023-12-05 LAB — CBC
HCT: 28.7 % — ABNORMAL LOW (ref 39.0–52.0)
Hemoglobin: 9 g/dL — ABNORMAL LOW (ref 13.0–17.0)
MCH: 28.5 pg (ref 26.0–34.0)
MCHC: 31.4 g/dL (ref 30.0–36.0)
MCV: 90.8 fL (ref 80.0–100.0)
Platelets: 195 10*3/uL (ref 150–400)
RBC: 3.16 MIL/uL — ABNORMAL LOW (ref 4.22–5.81)
RDW: 14.3 % (ref 11.5–15.5)
WBC: 9.2 10*3/uL (ref 4.0–10.5)
nRBC: 0 % (ref 0.0–0.2)

## 2023-12-05 LAB — BASIC METABOLIC PANEL WITH GFR
Anion gap: 8 (ref 5–15)
BUN: 28 mg/dL — ABNORMAL HIGH (ref 8–23)
CO2: 22 mmol/L (ref 22–32)
Calcium: 8.5 mg/dL — ABNORMAL LOW (ref 8.9–10.3)
Chloride: 107 mmol/L (ref 98–111)
Creatinine, Ser: 1.89 mg/dL — ABNORMAL HIGH (ref 0.61–1.24)
GFR, Estimated: 37 mL/min — ABNORMAL LOW (ref >60)
Glucose, Bld: 97 mg/dL (ref 70–99)
Potassium: 4 mmol/L (ref 3.5–5.1)
Sodium: 137 mmol/L (ref 135–145)

## 2023-12-05 SURGERY — COLONOSCOPY
Anesthesia: Monitor Anesthesia Care

## 2023-12-05 MED ORDER — PROPOFOL 500 MG/50ML IV EMUL
INTRAVENOUS | Status: DC | PRN
  Administered 2023-12-05: 100 ug/kg/min via INTRAVENOUS

## 2023-12-05 MED ORDER — PROPOFOL 10 MG/ML IV BOLUS
INTRAVENOUS | Status: DC | PRN
  Administered 2023-12-05: 30 mg via INTRAVENOUS
  Administered 2023-12-05: 10 mg via INTRAVENOUS
  Administered 2023-12-05: 20 mg via INTRAVENOUS

## 2023-12-05 MED ORDER — SODIUM CHLORIDE 0.9 % IV SOLN
INTRAVENOUS | Status: AC | PRN
  Administered 2023-12-05: 500 mL via INTRAMUSCULAR

## 2023-12-05 MED ORDER — LIDOCAINE 2% (20 MG/ML) 5 ML SYRINGE
INTRAMUSCULAR | Status: DC | PRN
  Administered 2023-12-05: 40 mg via INTRAVENOUS

## 2023-12-05 NOTE — Progress Notes (Signed)
 [facility] PROGRESS NOTE    Progress Note  Calvin Escobar  FMW:[id number] DOB: 15-Oct-1950 DOA: 12/02/2023 PCP: [name], MD     Brief Narrative:   Calvin Escobar is an 73 y.o. male past medical history significant for hyperlipidemia, systolic heart failure, history of colovesical fistula urinary retention anemia of chronic disease bipolar disorder dementia came in complaining of hematuria urology placed a Foley catheter.  Blood cultures obtained on 11/20/2022 came back for Candida patient called back for further evaluation  Assessment/Plan:   Candidemia (HCC) Blood cultures obtained on 11/20/2023 came positive for Candida. ID was consulted repeated blood cultures showing no growth till date. Continue micafungin. Ophthalmology evaluation showed no ocular involvement. Awaiting sensitivities for Candida. ID on board continue current management.  Hematuria/BPH/urinary retention: Patient developed urinary retention secondary to BPH and urethral stricture. Foley catheter was placed by urology 2 days prior to admission. Hematuria is improved. CT scan showed possible cystitis associated with left hydronephrosis urology recommended to follow-up with them as an outpatient. He will need to be discharged with Foley catheter. He will need to follow-up with colorectal surgery as an outpatient. GI was consulted and performed colonoscopy on 12/05/2023 that showed diverticulosis with several luminal narrowings in the distal sigmoid with severe diverticular disease which I suspect at the site of the fistula area.  Okay to proceed with colorectal surgery.  Acute kidney injury on chronic kidney disease stage IIIa: With a baseline creatinine of like around 1 admission 1.8. Likely hemodynamically mediated resolved with IV fluids.  Anemia of chronic disease: Hemoglobin stable.  Dementia: Continue delirium precaution.  Anxiety/bipolar disorder: Continue Abilify , BuSpar ,  mirtazapine  and Zanaflex.  Hyperlipidemia: Continue statins.  History of colovesicular fistula: With recurrent bacteriuria in process of being evaluated by general surgery as an outpatient for resection.    DVT prophylaxis: lovenox  Family Communication: Wife Status is: Inpatient Remains inpatient appropriate because: Candidemia    Code Status:     Code Status Orders  (From admission, onward)           Start     Ordered   12/02/23 1123  Do not attempt resuscitation (DNR)- Limited -Do Not Intubate (DNI)  Continuous       Question Answer Comment  If pulseless and not breathing No CPR or chest compressions.   In Pre-Arrest Conditions (Patient Is Breathing and Has A Pulse) Do not intubate. Provide all appropriate non-invasive medical interventions. Avoid ICU transfer unless indicated or required.   Consent: Discussion documented in EHR or advanced directives reviewed      12/02/23 1123           Code Status History     Date Active Date Inactive Code Status Order ID Comments User Context   07/13/2023 0008 07/19/2023 1918 Limited: Do not attempt resuscitation (DNR) -DNR-LIMITED -Do Not Intubate/DNI  [id number]  Marcene Eva NOVAK, DO ED   07/02/2023 1539 07/05/2023 1830 Limited: Do not attempt resuscitation (DNR) -DNR-LIMITED -Do Not Intubate/DNI  [id number]  Esequiel Rosaline GRADE, NP ED   07/01/2023 2258 07/02/2023 1539 Full Code [id number]  Moody Alto, MD ED   06/02/2023 0912 06/11/2023 1610 Full Code [id number]  Waddell Rake, MD ED   06/02/2023 0839 06/02/2023 0912 Full Code [id number]  Waddell Rake, MD ED   05/13/2023 0647 05/16/2023 2326 Full Code [id number]  Alfornia Madison, MD ED   01/29/2016 1339 02/03/2016 2354 Full Code [id number]  Cinderella Goldman, MD ED      Advance Directive Documentation  Flowsheet Row Most Recent Value  Type of Advance Directive Living will  Pre-existing out of facility DNR order (yellow form or pink MOST form) --  MOST Form in Place? --       IV Access:   Peripheral IV   Procedures and diagnostic studies:   No results found.   Medical Consultants:   None.   Subjective:    Calvin Escobar no complaints  Objective:    Vitals:   12/05/23 0737 12/05/23 0857 12/05/23 0900 12/05/23 0910  BP: (!) 148/99 115/69 110/71 (!) 140/74  Pulse: 79 82 80 83  Resp: 12 (!) 26 (!) 25 18  Temp: 97.7 F (36.5 C) 97.8 F (36.6 C)    TempSrc: Temporal Temporal    SpO2: 97% 99% 99% 97%  Weight: 108 kg     Height: 6' (1.829 m)      SpO2: 97 % O2 Flow Rate (L/min): 6 L/min   Intake/Output Summary (Last 24 hours) at 12/05/2023 1050 Last data filed at 12/05/2023 0853 Gross per 24 hour  Intake 300 ml  Output 1500 ml  Net -1200 ml   Filed Weights   12/02/23 0520 12/05/23 0737  Weight: 108 kg 108 kg    Exam: General exam: In no acute distress. Respiratory system: Good air movement and clear to auscultation. Cardiovascular system: S1 & S2 heard, RRR. No JVD. Gastrointestinal system: Abdomen is nondistended, soft and nontender.  Extremities: No pedal edema. Skin: No rashes, lesions or ulcers Psychiatry: Judgement and insight appear normal. Mood & affect appropriate.    Data Reviewed:    Labs: Basic Metabolic Panel: Recent Labs  Lab 11/30/23 0941 12/02/23 0523 12/03/23 0429 12/04/23 0429 12/05/23 0308  NA 139 137 137 135 137  K 5.0 4.2 3.7 4.0 4.0  CL 106 107 108 105 107  CO2 25 22 24  21* 22  GLUCOSE 81 117* 109* 114* 97  BUN 31* 29* 25* 29* 28*  CREATININE 1.78* 1.88* 1.81* 1.86* 1.89*  CALCIUM  8.9 8.9 8.3* 8.3* 8.5*   GFR Estimated Creatinine Clearance: 42.9 mL/min (A) (by C-G formula based on SCr of 1.89 mg/dL (H)). Liver Function Tests: Recent Labs  Lab 11/30/23 0941  AST 11*  ALT 5  ALKPHOS 80  BILITOT <0.2  PROT 7.2  ALBUMIN  3.6   No results for input(s): LIPASE, AMYLASE in the last 168 hours. No results for input(s): AMMONIA in the last 168 hours. Coagulation profile No  results for input(s): INR, PROTIME in the last 168 hours. COVID-19 Labs  Recent Labs    12/02/23 1407  CRP 5.9*    Lab Results  Component Value Date   SARSCOV2NAA NEGATIVE 07/12/2023   SARSCOV2NAA NEGATIVE 07/01/2023   SARSCOV2NAA NEGATIVE 06/02/2023   SARSCOV2NAA NEGATIVE 07/01/2019    CBC: Recent Labs  Lab 11/30/23 0941 12/02/23 0523 12/03/23 0429 12/04/23 0429 12/05/23 0308  WBC 5.8 8.5 8.4 9.6 9.2  NEUTROABS 3.0  --   --   --   --   HGB 9.6* 10.7* 9.4* 9.6* 9.0*  HCT 32.7* 34.7* 30.0* 30.2* 28.7*  MCV 98.2 93.0 91.5 91.0 90.8  PLT 177 200 187 184 195   Cardiac Enzymes: No results for input(s): CKTOTAL, CKMB, CKMBINDEX, TROPONINI in the last 168 hours. BNP (last 3 results) No results for input(s): PROBNP in the last 8760 hours. CBG: Recent Labs  Lab 12/03/23 1735  GLUCAP 139*   D-Dimer: No results for input(s): DDIMER in the last 72 hours. Hgb A1c: No results for  input(s): HGBA1C in the last 72 hours. Lipid Profile: No results for input(s): CHOL, HDL, LDLCALC, TRIG, CHOLHDL, LDLDIRECT in the last 72 hours. Thyroid  function studies: No results for input(s): TSH, T4TOTAL, T3FREE, THYROIDAB in the last 72 hours.  Invalid input(s): FREET3 Anemia work up: No results for input(s): VITAMINB12, FOLATE, FERRITIN, TIBC, IRON, RETICCTPCT in the last 72 hours. Sepsis Labs: Recent Labs  Lab 12/02/23 0523 12/02/23 0803 12/02/23 1019 12/03/23 0429 12/04/23 0429 12/05/23 0308  WBC 8.5  --   --  8.4 9.6 9.2  LATICACIDVEN  --  0.7 0.6  --   --   --    Microbiology Recent Results (from the past 240 hours)  Blood culture (routine x 2)     Status: None (Preliminary result)   Collection Time: 12/02/23  7:40 AM   Specimen: BLOOD LEFT ARM  Result Value Ref Range Status   Specimen Description BLOOD LEFT ARM  Final   Special Requests   Final    BOTTLES DRAWN AEROBIC AND ANAEROBIC Blood Culture results may not be  optimal due to an inadequate volume of blood received in culture bottles   Culture   Final    NO GROWTH 3 DAYS Performed at [facility], [address], [place], [place] [address]    Report Status PENDING  Incomplete  Blood culture (routine x 2)     Status: None (Preliminary result)   Collection Time: 12/02/23  7:41 AM   Specimen: BLOOD  Result Value Ref Range Status   Specimen Description BLOOD RIGHT ANTECUBITAL  Final   Special Requests   Final    BOTTLES DRAWN AEROBIC AND ANAEROBIC Blood Culture adequate volume   Culture   Final    NO GROWTH 3 DAYS Performed at [facility], [address], [place], [place] [address]    Report Status PENDING  Incomplete  Fungus culture, blood     Status: None (Preliminary result)   Collection Time: 12/02/23  2:08 PM   Specimen: BLOOD  Result Value Ref Range Status   Specimen Description BLOOD SITE NOT SPECIFIED  Final   Special Requests   Final    BOTTLES DRAWN AEROBIC ONLY Blood Culture adequate volume   Culture   Final    NO GROWTH 3 DAYS Performed at [facility], [address] [place], [place] [address]    Report Status PENDING  Incomplete     Medications:    ARIPiprazole   2 mg Oral q AM   busPIRone   10 mg Oral TID   carvedilol   12.5 mg Oral BID   Chlorhexidine  Gluconate Cloth  6 each Topical Daily   finasteride  5 mg Oral Daily   folic acid   1 mg Oral Daily   gabapentin   100 mg Oral TID   mirtazapine   30 mg Oral QHS   pravastatin   20 mg Oral QHS   primidone   50 mg Oral Q8H   senna-docusate  1 tablet Oral BID   sodium chloride  flush  3 mL Intravenous Q12H   tamsulosin   0.4 mg Oral Daily   traZODone   100 mg Oral QHS   venlafaxine  XR  150 mg Oral Daily   Continuous Infusions:  sodium chloride  Stopped (12/05/23 0853)   micafungin (MYCAMINE) 100 mg in sodium chloride  0.9 % 100 mL IVPB 100 mg (12/04/23 1125)      LOS: 3 days   Calvin Escobar  [facility]  12/05/2023, 10:50 AM

## 2023-12-05 NOTE — Op Note (Signed)
 [facility] Patient Name: Calvin Escobar Procedure Date : 12/05/2023 MRN: [id number] Attending MD: Elspeth SQUIBB. Leigh , MD, [id number] Date of Birth: 19-Nov-1950 CSN: [id number] Age: 73 Admit Type: Inpatient Procedure:                Colonoscopy Indications:              Colovesical fistula - candidemia thought to be due                            to colovesicular fistula, pending colorectal                            surgery and needs colonoscopy pre-operatively Providers:                Elspeth P. Leigh, MD, Ozell Pouch, [facility]                            [place], Technician, Donald Jerl BONINE Referring MD:              Medicines:                Monitored Anesthesia Care Complications:            No immediate complications. Estimated blood loss:                            None. Estimated Blood Loss:     Estimated blood loss: none. Procedure:                Pre-Anesthesia Assessment:                           - Prior to the procedure, a History and Physical                            was performed, and patient medications and                            allergies were reviewed. The patient's tolerance of                            previous anesthesia was also reviewed. The risks                            and benefits of the procedure and the sedation                            options and risks were discussed with the patient.                            All questions were answered, and informed consent                            was obtained. Prior Anticoagulants: The patient has  taken no anticoagulant or antiplatelet agents. ASA                            Grade Assessment: III - A patient with severe                            systemic disease. After reviewing the risks and                            benefits, the patient was deemed in satisfactory                            condition to undergo the procedure.                            After obtaining informed consent, the colonoscope                            was passed under direct vision. Throughout the                            procedure, the patient's blood pressure, pulse, and                            oxygen saturations were monitored continuously. The                            CF-HQ190L ([id number]) Olympus colonoscope was                            introduced through the anus and advanced to the the                            cecum, identified by appendiceal orifice and                            ileocecal valve. The colonoscopy was technically                            difficult and complex due to restricted mobility of                            the colon. The patient tolerated the procedure                            well. The quality of the bowel preparation was                            mostly adequate - some areas of fair prep. The                            ileocecal valve, the appendiceal orifice and the  rectum were photographed. Scope In: 8:34:10 AM Scope Out: 8:51:50 AM Scope Withdrawal Time: 0 hours 7 minutes 44 seconds  Total Procedure Duration: 0 hours 17 minutes 40 seconds  Findings:      The perianal and digital rectal examinations were normal.      Many diverticula were found in the entire colon. There was restricted       mobility in the distal sigmoid colon, diverticular stricturing. The       adult colonoscope could not traverse the area. This was removed in favor       of an ultraslim pediatric colonoscope. This was able to slowly traverse       the strictured area. Severe diverticular disease noted there.      Internal hemorrhoids were found during retroflexion.      The exam was otherwise without abnormality. The cecal cap had residual       stool / seeds which could not be cleared. The AO was thought to have       been seen and IC valve, but could not clear the cecum. There was       significant looping of  the ultraslim pediatric colonoscope in the right       colon. Impression:               - Diverticulosis in the entire examined colon.                           - Severe luminal narrowing in the distal sigmoid                            colon with severe diverticular disease, I suspect                            site of the fistula in that area.                           - Internal hemorrhoids.                           - The examination was otherwise normal.                           - No polyps. Recommendation:           - Return patient to hospital ward for ongoing care.                           - Resume previous diet.                           - Continue present medications.                           - Cleared to proceed with colorectal surgery                           - GI service will sign off, call with questions in  the interim. 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 All rights reserved. The codes documented in this report are preliminary and upon coder review may  be revised to meet current compliance requirements. Elspeth P. Libni Fusaro, MD 12/05/2023 9:01:56 AM This report has been signed electronically. Number of Addenda: 0

## 2023-12-05 NOTE — Interval H&P Note (Signed)
 History and Physical Interval Note: No interval changes since yesterday. He states he was prepped okay and ready for procedure. Have discussed risks benefits and he wishes to proceed, all questions answered.   12/05/2023 8:17 AM  Calvin Escobar  has presented today for surgery, with the diagnosis of colovesicular fistula.  The various methods of treatment have been discussed with the patient and family. After consideration of risks, benefits and other options for treatment, the patient has consented to  Procedure(s): COLONOSCOPY (N/A) as a surgical intervention.  The patient's history has been reviewed, patient examined, no change in status, stable for surgery.  I have reviewed the patient's chart and labs.  Questions were answered to the patient's satisfaction.     Elspeth P Nava Song

## 2023-12-05 NOTE — Anesthesia Preprocedure Evaluation (Signed)
 Anesthesia Evaluation  Patient identified by MRN, date of birth, ID band Patient awake    Reviewed: Allergy & Precautions, H&P , NPO status , Patient's Chart, lab work & pertinent test results  Airway Mallampati: II  TM Distance: >3 FB Neck ROM: Full    Dental no notable dental hx. (+) Edentulous Upper, Partial Lower, Dental Advisory Given   Pulmonary sleep apnea    Pulmonary exam normal breath sounds clear to auscultation       Cardiovascular hypertension, Pt. on medications and Pt. on home beta blockers  Rhythm:Regular Rate:Normal     Neuro/Psych   Anxiety Depression Bipolar Disorder  Dementia negative neurological ROS     GI/Hepatic Neg liver ROS, PUD,GERD  ,,  Endo/Other  negative endocrine ROS    Renal/GU Renal disease  negative genitourinary   Musculoskeletal  (+) Arthritis , Osteoarthritis,    Abdominal   Peds  Hematology  (+) Blood dyscrasia, anemia   Anesthesia Other Findings   Reproductive/Obstetrics negative OB ROS                              Anesthesia Physical Anesthesia Plan  ASA: 3  Anesthesia Plan: MAC   Post-op Pain Management: Minimal or no pain anticipated   Induction: Intravenous  PONV Risk Score and Plan: 1 and Propofol infusion  Airway Management Planned: Natural Airway and Simple Face Mask  Additional Equipment:   Intra-op Plan:   Post-operative Plan:   Informed Consent: I have reviewed the patients History and Physical, chart, labs and discussed the procedure including the risks, benefits and alternatives for the proposed anesthesia with the patient or authorized representative who has indicated his/her understanding and acceptance.     Dental advisory given  Plan Discussed with: CRNA  Anesthesia Plan Comments:         Anesthesia Quick Evaluation

## 2023-12-05 NOTE — Transfer of Care (Addendum)
 Immediate Anesthesia Transfer of Care Note  Patient: Calvin Escobar  Procedure(s) Performed: COLONOSCOPY  Patient Location: PACU and Endoscopy Unit  Anesthesia Type:MAC  Level of Consciousness: awake and drowsy  Airway & Oxygen Therapy: Patient Spontanous Breathing and Patient connected to face mask oxygen  Post-op Assessment: Report given to RN, Post -op Vital signs reviewed and stable, and Patient moving all extremities  Post vital signs: Reviewed and stable  Last Vitals:  Vitals Value Taken Time  BP 115/69 12/05/23 08:57  Temp 36.6 C 12/05/23 08:57  Pulse 83 12/05/23 08:57  Resp 23 12/05/23 08:57  SpO2 99 % 12/05/23 08:57  Vitals shown include unfiled device data.  Last Pain:  Vitals:   12/05/23 0857  TempSrc: Temporal  PainSc: 0-No pain      Patients Stated Pain Goal: 0 (12/04/23 2000)  Complications: No notable events documented.

## 2023-12-05 NOTE — Progress Notes (Signed)
 Subjective: New complaints   Antibiotics:  Anti-infectives (From admission, onward)    Start     Dose/Rate Route Frequency Ordered Stop   12/02/23 1000  micafungin (MYCAMINE) 100 mg in sodium chloride  0.9 % 100 mL IVPB        100 mg 105 mL/hr over 1 Hours Intravenous Every 24 hours 12/02/23 0946         Medications: Scheduled Meds:  ARIPiprazole   2 mg Oral q AM   busPIRone   10 mg Oral TID   carvedilol   12.5 mg Oral BID   Chlorhexidine  Gluconate Cloth  6 each Topical Daily   finasteride  5 mg Oral Daily   folic acid   1 mg Oral Daily   gabapentin   100 mg Oral TID   mirtazapine   30 mg Oral QHS   pravastatin   20 mg Oral QHS   primidone   50 mg Oral Q8H   senna-docusate  1 tablet Oral BID   sodium chloride  flush  3 mL Intravenous Q12H   tamsulosin   0.4 mg Oral Daily   traZODone   100 mg Oral QHS   venlafaxine  XR  150 mg Oral Daily   Continuous Infusions:  micafungin (MYCAMINE) 100 mg in sodium chloride  0.9 % 100 mL IVPB 100 mg (12/05/23 1113)   PRN Meds:.acetaminophen  **OR** acetaminophen , melatonin    Objective: Weight change:   Intake/Output Summary (Last 24 hours) at 12/05/2023 1549 Last data filed at 12/05/2023 0853 Gross per 24 hour  Intake 300 ml  Output 1500 ml  Net -1200 ml   Blood pressure 124/79, pulse 82, temperature 98 F (36.7 C), resp. rate 19, height 6' (1.829 m), weight 108 kg, SpO2 96%. Temp:  [97.4 F (36.3 C)-98 F (36.7 C)] 98 F (36.7 C) (07/03 1248) Pulse Rate:  [72-86] 82 (07/03 1248) Resp:  [12-26] 19 (07/03 1248) BP: (110-148)/(68-99) 124/79 (07/03 1248) SpO2:  [94 %-99 %] 96 % (07/03 1248) Weight:  [891 kg] 108 kg (07/03 0737)  Physical Exam: Physical Exam Constitutional:      Appearance: He is well-developed.  HENT:     Head: Normocephalic and atraumatic.  Eyes:     Conjunctiva/sclera: Conjunctivae normal.  Cardiovascular:     Rate and Rhythm: Normal rate and regular rhythm.  Pulmonary:     Effort: Pulmonary effort  is normal. No respiratory distress.     Breath sounds: No wheezing.  Abdominal:     General: There is no distension.     Palpations: Abdomen is soft.  Musculoskeletal:        General: Normal range of motion.     Cervical back: Normal range of motion and neck supple.  Skin:    General: Skin is warm and dry.     Findings: No erythema or rash.  Neurological:     General: No focal deficit present.     Mental Status: He is alert and oriented to person, place, and time.  Psychiatric:        Mood and Affect: Mood normal.        Behavior: Behavior normal.        Thought Content: Thought content normal.        Cognition and Memory: He exhibits impaired recent memory and impaired remote memory.        Judgment: Judgment normal.      CBC:    BMET Recent Labs    12/04/23 0429 12/05/23 0308  NA 135 137  K 4.0 4.0  CL 105 107  CO2 21* 22  GLUCOSE 114* 97  BUN 29* 28*  CREATININE 1.86* 1.89*  CALCIUM  8.3* 8.5*     Liver Panel  No results for input(s): PROT, ALBUMIN , AST, ALT, ALKPHOS, BILITOT, BILIDIR, IBILI in the last 72 hours.     Sedimentation Rate No results for input(s): ESRSEDRATE in the last 72 hours. C-Reactive Protein No results for input(s): CRP in the last 72 hours.   Micro Results: Recent Results (from the past 720 hours)  Fungus culture, blood     Status: Abnormal   Collection Time: 11/20/23  1:28 PM   Specimen: Blood   BLD  Result Value Ref Range Status   Fungus (Mycology) Culture Final report (A)  Final   Fungal result 1 Candida albicans (A)  Final    Comment: Heavy growth  Yeast Susceptibilities     Status: None (Preliminary result)   Collection Time: 11/20/23  1:28 PM   BLD  Result Value Ref Range Status   Organism ID, Yeast WILL FOLLOW  Preliminary   Amphotericin B MIC WILL FOLLOW  Preliminary   Anidulafungin MIC WILL FOLLOW  Preliminary   Caspofungin MIC WILL FOLLOW  Preliminary   Fluconazole  Islt MIC WILL FOLLOW   Preliminary   ISAVUCONAZOLE MIC WILL FOLLOW  Preliminary   Itraconazole MIC WILL FOLLOW  Preliminary   Micafungin MIC WILL FOLLOW  Preliminary   Posaconazole MIC WILL FOLLOW  Preliminary   REZAFUNGIN MIC WILL FOLLOW  Preliminary   Voriconazole MIC WILL FOLLOW  Preliminary  Urine Culture     Status: None   Collection Time: 11/20/23  2:28 PM   Specimen: Urine  Result Value Ref Range Status   MICRO NUMBER: [id number]  Final   SPECIMEN QUALITY: Adequate  Final   Sample Source URINE  Final   STATUS: FINAL  Final   Result: No Growth  Final  Blood culture (routine x 2)     Status: None (Preliminary result)   Collection Time: 12/02/23  7:40 AM   Specimen: BLOOD LEFT ARM  Result Value Ref Range Status   Specimen Description BLOOD LEFT ARM  Final   Special Requests   Final    BOTTLES DRAWN AEROBIC AND ANAEROBIC Blood Culture results may not be optimal due to an inadequate volume of blood received in culture bottles   Culture   Final    NO GROWTH 3 DAYS Performed at [facility], [address], [place], [place] [address]    Report Status PENDING  Incomplete  Blood culture (routine x 2)     Status: None (Preliminary result)   Collection Time: 12/02/23  7:41 AM   Specimen: BLOOD  Result Value Ref Range Status   Specimen Description BLOOD RIGHT ANTECUBITAL  Final   Special Requests   Final    BOTTLES DRAWN AEROBIC AND ANAEROBIC Blood Culture adequate volume   Culture   Final    NO GROWTH 3 DAYS Performed at [facility], [address] [place], [place] [address]    Report Status PENDING  Incomplete  Fungus culture, blood     Status: None (Preliminary result)   Collection Time: 12/02/23  2:08 PM   Specimen: BLOOD  Result Value Ref Range Status   Specimen Description BLOOD SITE NOT SPECIFIED  Final   Special Requests   Final    BOTTLES DRAWN AEROBIC ONLY Blood Culture adequate volume   Culture   Final    NO GROWTH 3 DAYS Performed  at [facility], [address], [place], [place] [address]    Report Status PENDING  Incomplete    Studies/Results: No results found.     Assessment/Plan:  INTERVAL HISTORY:  Colonoscopy performed  Principal Problem:   Candidemia (HCC) Active Problems:   Dementia (HCC)   HLD (hyperlipidemia)   Anemia of chronic disease   Acute kidney injury superimposed on chronic kidney disease (HCC)   Colovesical fistula   Hematuria   BPH with urinary obstruction   Anxiety   Bipolar disorder (HCC)   Obesity (BMI 30-39.9)   Colonization with VRE (vancomycin -resistant enterococcus)    Calvin Escobar is a 73 y.o. 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  #1 Candidemia:  Continue micafungin for now Follow-up sensitivity data  Depending upon his length of stay we may have to make a leap of faith and go to oral fluconazole   #2 colovesicular fistula:  Greatly appreciate Dr. Arlene performing colonoscopy which showed:  Diverticulosis in the entire examined colon.                           - Severe luminal narrowing in the distal sigmoid                            colon with severe diverticular disease, I suspect                            site of the fistul He has been cleared for colorectal surgery  I would think might be a good idea to have him have this surgery while he is here in the inpatient worldd    #3 Contact precautions for VRE in past  I have personally spent 50 minutes involved in face-to-face and non-face-to-face activities for this patient on the day of the visit. Professional time spent includes the following activities: Preparing to see the patient (review of tests), Obtaining and/or reviewing separately obtained history (admission/discharge record), Performing a medically appropriate examination and/or evaluation , Ordering medications/tests/procedures,  referring and communicating with other health care professionals, Documenting clinical information in the EMR, Independently interpreting results (not separately reported), Communicating results to the patient/family/caregiver, Counseling and educating the patient/family/caregiver and Care coordination (not separately reported).   Evaluation of the patient requires complex antimicrobial therapy evaluation, counseling , isolation needs to reduce disease transmission and risk assessment and mitigation.     LOS: 3 days   Calvin Escobar 12/05/2023, 3:49 PM

## 2023-12-05 NOTE — Anesthesia Postprocedure Evaluation (Signed)
 Anesthesia Post Note  Patient: Calvin Escobar  Procedure(s) Performed: COLONOSCOPY     Patient location during evaluation: Endoscopy Anesthesia Type: MAC Level of consciousness: awake and alert Pain management: pain level controlled Vital Signs Assessment: post-procedure vital signs reviewed and stable Respiratory status: spontaneous breathing, nonlabored ventilation and respiratory function stable Cardiovascular status: stable and blood pressure returned to baseline Postop Assessment: no apparent nausea or vomiting Anesthetic complications: no   No notable events documented.  Last Vitals:  Vitals:   12/05/23 0900 12/05/23 0910  BP: 110/71 (!) 140/74  Pulse: 80 83  Resp: (!) 25 18  Temp:    SpO2: 99% 97%    Last Pain:  Vitals:   12/05/23 0910  TempSrc:   PainSc: 0-No pain                 Prospero Mahnke,W. EDMOND

## 2023-12-06 ENCOUNTER — Other Ambulatory Visit: Payer: Self-pay | Admitting: Physician Assistant

## 2023-12-06 DIAGNOSIS — B377 Candidal sepsis: Secondary | ICD-10-CM | POA: Diagnosis not present

## 2023-12-06 MED ORDER — ORAL CARE MOUTH RINSE: 15.0000 mL | OROMUCOSAL | Status: DC | PRN

## 2023-12-06 MED ORDER — FLUCONAZOLE 200 MG PO TABS
800.0000 mg | ORAL_TABLET | Freq: Once | ORAL | Status: AC
  Administered 2023-12-06: 800 mg via ORAL
  Filled 2023-12-06: qty 4

## 2023-12-06 MED ORDER — FLUCONAZOLE 200 MG PO TABS
400.0000 mg | ORAL_TABLET | Freq: Every day | ORAL | Status: DC
  Administered 2023-12-07: 400 mg via ORAL
  Filled 2023-12-06: qty 2

## 2023-12-06 NOTE — Plan of Care (Signed)

## 2023-12-06 NOTE — Plan of Care (Deleted)
  Problem: Education: Goal: Knowledge of General Education information will improve Description: Including pain rating scale, medication(s)/side effects and non-pharmacologic comfort measures 12/06/2023 0529 by Gilberto Tinnie KIDD, RN Outcome: Progressing 12/06/2023 0345 by Gilberto Tinnie KIDD, RN Outcome: Progressing   Problem: Health Behavior/Discharge Planning: Goal: Ability to manage health-related needs will improve 12/06/2023 0529 by Gilberto Tinnie KIDD, RN Outcome: Progressing 12/06/2023 0345 by Gilberto Tinnie KIDD, RN Outcome: Progressing   Problem: Clinical Measurements: Goal: Ability to maintain clinical measurements within normal limits will improve 12/06/2023 0529 by Gilberto Tinnie KIDD, RN Outcome: Progressing 12/06/2023 0345 by Gilberto Tinnie KIDD, RN Outcome: Progressing Goal: Will remain free from infection 12/06/2023 0529 by Gilberto Tinnie KIDD, RN Outcome: Progressing 12/06/2023 0345 by Gilberto Tinnie KIDD, RN Outcome: Progressing Goal: Diagnostic test results will improve 12/06/2023 0529 by Gilberto Tinnie KIDD, RN Outcome: Progressing 12/06/2023 0345 by Gilberto Tinnie KIDD, RN Outcome: Progressing Goal: Respiratory complications will improve 12/06/2023 0529 by Gilberto Tinnie KIDD, RN Outcome: Progressing 12/06/2023 0345 by Gilberto Tinnie KIDD, RN Outcome: Progressing Goal: Cardiovascular complication will be avoided 12/06/2023 0529 by Gilberto Tinnie KIDD, RN Outcome: Progressing 12/06/2023 0345 by Gilberto Tinnie KIDD, RN Outcome: Progressing   Problem: Activity: Goal: Risk for activity intolerance will decrease 12/06/2023 0529 by Gilberto Tinnie KIDD, RN Outcome: Progressing 12/06/2023 0345 by Gilberto Tinnie KIDD, RN Outcome: Progressing   Problem: Nutrition: Goal: Adequate nutrition will be maintained 12/06/2023 0529 by Gilberto Tinnie KIDD, RN Outcome: Progressing 12/06/2023 0345 by Gilberto Tinnie KIDD, RN Outcome: Progressing   Problem: Coping: Goal: Level of anxiety will decrease 12/06/2023 0529 by Gilberto Tinnie KIDD, RN Outcome: Progressing 12/06/2023 0345 by Gilberto Tinnie KIDD, RN Outcome: Progressing   Problem: Elimination: Goal: Will not experience complications related to bowel motility 12/06/2023 0529 by Gilberto Tinnie KIDD, RN Outcome: Progressing 12/06/2023 0345 by Gilberto Tinnie KIDD, RN Outcome: Progressing Goal: Will not experience complications related to urinary retention 12/06/2023 0529 by Gilberto Tinnie KIDD, RN Outcome: Progressing 12/06/2023 0345 by Gilberto Tinnie KIDD, RN Outcome: Progressing   Problem: Pain Managment: Goal: General experience of comfort will improve and/or be controlled 12/06/2023 0529 by Gilberto Tinnie KIDD, RN Outcome: Progressing 12/06/2023 0345 by Gilberto Tinnie KIDD, RN Outcome: Progressing   Problem: Safety: Goal: Ability to remain free from injury will improve 12/06/2023 0529 by Gilberto Tinnie KIDD, RN Outcome: Progressing 12/06/2023 0345 by Gilberto Tinnie KIDD, RN Outcome: Progressing   Problem: Skin Integrity: Goal: Risk for impaired skin integrity will decrease 12/06/2023 0529 by Gilberto Tinnie KIDD, RN Outcome: Progressing 12/06/2023 0345 by Gilberto Tinnie KIDD, RN Outcome: Progressing

## 2023-12-06 NOTE — Progress Notes (Signed)
 [facility]  Date of Admission:  12/02/2023     Reason for Follow Up: Candidemia [facility])  Total days of antibiotics 5         ASSESSMENT:  [name] is a 73 y/o Caucasian male with multiple medical problems including prior alcohol use and chronic colovesicular fistula presenting with candidemia and found to have Candida albicans candidemia.  Mr. Smock blood cultures from 12/02/2023 remain without growth to date and tolerating micafungin  with no adverse side effects.  Colonoscopy completed with no significant findings.  Candida albicans sensitivities remain pending.  Discussed plan of care to change micafungin  to fluconazole  and will start with 800 mg p.o. x 1 day and then 400 mg p.o. daily for a total of 2 weeks of treatment.  Will need follow-up with general surgery as he will be at high risk for recurrent infections given colovesicular fistula.  Previous QTc normal.  Continue contact precautions for VRE.  Will arrange follow-up in ID clinic.  Remaining medical and supportive care per internal medicine.    PLAN:  Change micafungin  to fluconazole  starting with loading dose of 800 mg p.o. x 1 and then 400 mg p.o. daily for a total of 2 weeks from clearance of blood cultures with end date 12/16/23.  Follow-up with general surgery for colovesicular fistula. Continue contact precautions for VRE. Follow-up in ID clinic and okay for discharge from ID standpoint. Remaining medical and supportive care per internal medicine.  Principal Problem:   Candidemia (HCC) Active Problems:   Dementia (HCC)   HLD (hyperlipidemia)   Anemia of chronic disease   Acute kidney injury superimposed on chronic kidney disease (HCC)   Colovesical fistula   Hematuria   BPH with urinary obstruction   Anxiety   Bipolar disorder (HCC)   Obesity (BMI 30-39.9)   Colonization with VRE (vancomycin -resistant enterococcus)    ARIPiprazole   2 mg Oral q AM   busPIRone   10 mg Oral TID    carvedilol   12.5 mg Oral BID   Chlorhexidine  Gluconate Cloth  6 each Topical Daily   finasteride   5 mg Oral Daily   [START ON 12/07/2023] fluconazole   400 mg Oral Daily   folic acid   1 mg Oral Daily   gabapentin   100 mg Oral TID   mirtazapine   30 mg Oral QHS   pravastatin   20 mg Oral QHS   primidone   50 mg Oral Q8H   senna-docusate  1 tablet Oral BID   sodium chloride  flush  3 mL Intravenous Q12H   tamsulosin   0.4 mg Oral Daily   traZODone   100 mg Oral QHS   venlafaxine  XR  150 mg Oral Daily    SUBJECTIVE:  Afebrile overnight with no acute events. Having some family issues going on. Expresses frustration current health situation.   Allergies  Allergen Reactions   Albuterol  Palpitations    Supraventricular Tachycardia      Review of Systems: Review of Systems  Constitutional:  Negative for chills, fever and weight loss.  Respiratory:  Negative for cough, shortness of breath and wheezing.   Cardiovascular:  Negative for chest pain and leg swelling.  Gastrointestinal:  Negative for abdominal pain, constipation, diarrhea, nausea and vomiting.  Skin:  Negative for rash.      OBJECTIVE: Vitals:   12/05/23 1629 12/05/23 2045 12/06/23 0421 12/06/23 0758  BP: 127/72 131/85 (!) 94/49 122/82  Pulse: 82 94 66 71  Resp: 19 20 16 20   Temp: 98.1 F (36.7  C) 97.9 F (36.6 C) 98 F (36.7 C) 98 F (36.7 C)  TempSrc:    Oral  SpO2: 99% 97% 95% 96%  Weight:      Height:       Body mass index is 32.29 kg/m.  Physical Exam Constitutional:      General: He is not in acute distress.    Appearance: He is well-developed.  Cardiovascular:     Rate and Rhythm: Normal rate and regular rhythm.     Heart sounds: Normal heart sounds.  Pulmonary:     Effort: Pulmonary effort is normal.     Breath sounds: Normal breath sounds.  Skin:    General: Skin is warm and dry.  Neurological:     Mental Status: He is alert and oriented to person, place, and time.     Lab Results Lab  Results  Component Value Date   WBC 9.2 12/05/2023   HGB 9.0 (L) 12/05/2023   HCT 28.7 (L) 12/05/2023   MCV 90.8 12/05/2023   PLT 195 12/05/2023    Lab Results  Component Value Date   CREATININE 1.89 (H) 12/05/2023   BUN 28 (H) 12/05/2023   NA 137 12/05/2023   K 4.0 12/05/2023   CL 107 12/05/2023   CO2 22 12/05/2023    Lab Results  Component Value Date   ALT 5 11/30/2023   AST 11 (L) 11/30/2023   ALKPHOS 80 11/30/2023   BILITOT <0.2 11/30/2023     Microbiology: Recent Results (from the past 240 hours)  Blood culture (routine x 2)     Status: None (Preliminary result)   Collection Time: 12/02/23  7:40 AM   Specimen: BLOOD LEFT ARM  Result Value Ref Range Status   Specimen Description BLOOD LEFT ARM  Final   Special Requests   Final    BOTTLES DRAWN AEROBIC AND ANAEROBIC Blood Culture results may not be optimal due to an inadequate volume of blood received in culture bottles   Culture   Final    NO GROWTH 4 DAYS Performed at [facility], [address] [place], [place] [address]    Report Status PENDING  Incomplete  Blood culture (routine x 2)     Status: None (Preliminary result)   Collection Time: 12/02/23  7:41 AM   Specimen: BLOOD  Result Value Ref Range Status   Specimen Description BLOOD RIGHT ANTECUBITAL  Final   Special Requests   Final    BOTTLES DRAWN AEROBIC AND ANAEROBIC Blood Culture adequate volume   Culture   Final    NO GROWTH 4 DAYS Performed at [facility], [address] [place], [place] [address]    Report Status PENDING  Incomplete  Fungus culture, blood     Status: None (Preliminary result)   Collection Time: 12/02/23  2:08 PM   Specimen: BLOOD  Result Value Ref Range Status   Specimen Description BLOOD SITE NOT SPECIFIED  Final   Special Requests   Final    BOTTLES DRAWN AEROBIC ONLY Blood Culture adequate volume   Culture   Final    NO GROWTH 4 DAYS Performed at [facility], [address] [place], [place]  [address]    Report Status PENDING  Incomplete    I have personally spent 28 minutes involved in face-to-face and non-face-to-face activities for this patient on the day of the visit. Professional time spent includes the following activities: preparing to see the patient (review of tests), obtaining  and reviewing separately obtained history (admission/discharge record), performing a medically appropriate examination, ordering medications, communicating with other health care professionals, documenting clinical information in the EMR, communicating results and counseling patient and family regarding medication and plan of care, and care coordination.    Cathlyn July, NP [facility]  12/06/2023  12:02 PM

## 2023-12-06 NOTE — TOC Progression Note (Signed)
 Transition of Care [facility]) - Progression Note    Patient Details  Name: Calvin Escobar MRN: [id number] Date of Birth: 1950/11/20  Transition [facility] Care [facility]) CM/SW Contact  Andrez JULIANNA George, RN Phone Number: 12/06/2023, 9:55 AM  Clinical Narrative:     PT recommending home health services. Pt has used [facility] in the past for [facility] and asked to use them again. Information on the AVS. [facility] will contact him for the first home visit.  Pt doesn't use DME at home.  He drives self but son and wife can assist.  Wife manages his medications at home. TOC following.   Expected Discharge Plan: Home w Home Health Services Barriers to Discharge: Continued Medical Work up  Expected Discharge Plan and Services   Discharge Planning Services: CM Consult Post Acute Care Choice: Home Health Living arrangements for the past 2 months: Single Family Home                           HH Arranged: PT HH Agency: [facility] Date [facility] Agency Contacted: 12/06/23   Representative spoke with at [facility] Agency: [place]   Social Determinants of Health (SDOH) Interventions SDOH Screenings   Food Insecurity: No Food Insecurity (12/02/2023)  Housing: Low Risk  (12/02/2023)  Transportation Needs: No Transportation Needs (12/02/2023)  Utilities: Not At Risk (12/02/2023)  Alcohol Screen: Low Risk  (10/10/2023)  Depression (PHQ2-9): Low Risk  (10/10/2023)  Recent Concern: Depression (PHQ2-9) - Medium Risk (09/16/2023)  Financial Resource Strain: Low Risk  (10/10/2023)  Physical Activity: Sufficiently Active (10/10/2023)  Social Connections: Moderately Isolated (12/02/2023)  Stress: No Stress Concern Present (10/10/2023)  Tobacco Use: Low Risk  (12/05/2023)  Health Literacy: Adequate Health Literacy (10/10/2023)    Readmission Risk Interventions    07/04/2023    1:27 PM 07/03/2023    2:48 PM 06/05/2023   10:12 AM  Readmission Risk Prevention Plan  Transportation Screening Complete Complete Complete  PCP or  Specialist Appt within 5-7 Days Complete Complete   PCP or Specialist Appt within 3-5 Days   Complete  Home Care Screening Complete Complete   Medication Review (RN CM) Complete Complete   HRI or Home Care Consult   Complete  Social Work Consult for Recovery Care Planning/Counseling   Complete  Palliative Care Screening   Complete  Medication Review Oceanographer)   Complete

## 2023-12-06 NOTE — Evaluation (Signed)
 Physical Therapy Evaluation Patient Details Name: Calvin Escobar MRN: [id number] DOB: 1950/12/02 Today's Date: 12/06/2023  History of Present Illness  73 y.o. male presents to ED from home 6/28 for hematuria. Urology placed a Foley catheter. Found to have candidemia. CT of abdomen worrisome for a chronic colovesical fistula.s/p colonoscopy on 12/05/2023 that showed diverticulosis with several luminal narrowings in the distal sigmoid with severe diverticular disease PMH: hyperlipidemia, systolic heart failure, history of colovesical fistula urinary retention anemia of chronic disease bipolar disorder dementia  Clinical Impression  PTA pt living at home with wife and son, both of whom work, so he is home alone for periods of time. Pt's home has 9 steps to enter with rails, and bed and bath on second floor. Pt report independence with ambulation in home, driving and independence with ADLs, wife assists with medication management. Pt is currently limited in safe mobility by self reported increased weakness, and associated decrease in balance and endurance, as well as decreased awareness of RW safety. Pt is currently independent with bed mobility and mod I for transfers. Pt is contact guard for ambulation and stair navigation, needs cuing for safe use. Pt reports he is weaker than he was before he came in and is in agreement with recommendation for HHPT. PT and Mobility Specialist will continue to follow acutely.      If plan is discharge home, recommend the following: A little help with walking and/or transfers;A little help with bathing/dressing/bathroom;Assistance with cooking/housework;Direct supervision/assist for medications management;Direct supervision/assist for financial management;Help with stairs or ramp for entrance;Supervision due to cognitive status;Assist for transportation   Can travel by private vehicle    No    Equipment Recommendations None recommended by PT     Functional Status  Assessment Patient has had a recent decline in their functional status and demonstrates the ability to make significant improvements in function in a reasonable and predictable amount of time.     Precautions / Restrictions Precautions Precautions: Fall Recall of Precautions/Restrictions: Intact Precaution/Restrictions Comments: indwelling urinary catheter Restrictions Weight Bearing Restrictions Per Provider Order: No      Mobility  Bed Mobility Overal bed mobility: Independent             General bed mobility comments: able to come to seated from flattened bed    Transfers Overall transfer level: Modified independent Equipment used: Rolling walker (2 wheels)               General transfer comment: good power up before reaching to RW    Ambulation/Gait Ambulation/Gait assistance: Contact guard assist Gait Distance (Feet): 200 Feet Assistive device: Rolling walker (2 wheels) Gait Pattern/deviations: Step-through pattern, Shuffle, Narrow base of support Gait velocity: slowed Gait velocity interpretation: <1.8 ft/sec, indicate of risk for recurrent falls   General Gait Details: contact guard for safety, despite cuing for upright posture pt with flexed posture that keeps LE outside RW and then feet run into walker with turns, cues for proximity to RW, without much carryover  Stairs Stairs: Yes Stairs assistance: Contact guard assist Stair Management: Two rails, Alternating pattern, Step to pattern Number of Stairs: 10 General stair comments: pt able to ascend steps with use of rails and step over step pattern, descends steps with a step to pattern leading with R LE, contact guard for safety      Balance Overall balance assessment: Needs assistance Sitting-balance support: Feet supported Sitting balance-Leahy Scale: Normal     Standing balance support: During functional activity, No upper  extremity supported, Bilateral upper extremity supported Standing  balance-Leahy Scale: Fair Standing balance comment: benifits from UE support with dynamic activities                             Pertinent Vitals/Pain Pain Assessment Pain Assessment: Faces Pain Location:  (generalized) Pain Descriptors / Indicators: Grimacing, Guarding Pain Intervention(s): Limited activity within patient's tolerance, Monitored during session    Home Living Family/patient expects to be discharged to:: Private residence Living Arrangements: Spouse/significant other Available Help at Discharge: Family;Available PRN/intermittently Type of Home: House Home Access: Stairs to enter Entrance Stairs-Rails: None Entrance Stairs-Number of Steps: 8-9 Alternate Level Stairs-Number of Steps: flight Home Layout: Two level;Bed/bath upstairs Home Equipment: Agricultural consultant (2 wheels)      Prior Function Prior Level of Function : Independent/Modified Independent;Patient poor historian/Family not available             Mobility Comments: ind without AD, drives ADLs Comments: spouse does meds management, finances,     Extremity/Trunk Assessment   Upper Extremity Assessment Upper Extremity Assessment: Defer to OT evaluation    Lower Extremity Assessment Lower Extremity Assessment: Generalized weakness;Overall [facility] for tasks assessed    Cervical / Trunk Assessment Cervical / Trunk Assessment: Kyphotic  Communication   Communication Communication: No apparent difficulties Factors Affecting Communication: Other (comment) (slowed speech)    Cognition Arousal: Alert Behavior During Therapy: Flat affect, WFL for tasks assessed/performed   PT - Cognitive impairments: Safety/Judgement                       PT - Cognition Comments: pt with decreased safety judgement with use of RW reaching out for items in room in addition to managing RW Following commands: Intact       Cueing Cueing Techniques: Verbal cues, Gestural cues, Visual cues, Tactile  cues     General Comments General comments (skin integrity, edema, etc.): VSS on RA, L LE with slight swelling        Assessment/Plan    PT Assessment Patient needs continued PT services  PT Problem List Decreased strength;Decreased activity tolerance;Decreased balance;Decreased mobility       PT Treatment Interventions DME instruction;Gait training;Stair training;Functional mobility training;Therapeutic activities;Therapeutic exercise;Balance training;Cognitive remediation;Patient/family education    PT Goals (Current goals can be found in the Care Plan section)  Acute Rehab PT Goals Patient Stated Goal: get stronger PT Goal Formulation: With patient Time For Goal Achievement: 12/20/23 Potential to Achieve Goals: Fair    Frequency Min 2X/week        AM-PAC PT 6 Clicks Mobility  Outcome Measure Help needed turning from your back to your side while in a flat bed without using bedrails?: None Help needed moving from lying on your back to sitting on the side of a flat bed without using bedrails?: None Help needed moving to and from a bed to a chair (including a wheelchair)?: A Little Help needed standing up from a chair using your arms (e.g., wheelchair or bedside chair)?: A Little Help needed to walk in hospital room?: A Little Help needed climbing 3-5 steps with a railing? : A Little 6 Click Score: 20    End of Session Equipment Utilized During Treatment: Gait belt Activity Tolerance: Patient tolerated treatment well Patient left: in chair;with call bell/phone within reach;with chair alarm set Nurse Communication: Mobility status PT Visit Diagnosis: Unsteadiness on feet (R26.81);Other abnormalities of gait and mobility (R26.89);Muscle weakness (generalized) (M62.81);Difficulty  in walking, not elsewhere classified (R26.2)    Time: 9093-9061 PT Time Calculation (min) (ACUTE ONLY): 32 min   Charges:   PT Evaluation $PT Eval Moderate Complexity: 1 Mod PT  Treatments $Gait Training: 8-22 mins PT General Charges $$ ACUTE PT VISIT: 1 Visit         Brenten Janney B. Fleeta Lapidus PT, DPT Acute Rehabilitation Services Please use secure chat or  Call Office [phone]   Almarie KATHEE Fleeta [facility] 12/06/2023, 9:57 AM

## 2023-12-06 NOTE — Plan of Care (Deleted)
 Problem: Education: Goal: Knowledge of General Education information will improve Description: Including pain rating scale, medication(s)/side effects and non-pharmacologic comfort measures 12/06/2023 0531 by Gilberto Tinnie KIDD, RN Outcome: Progressing 12/06/2023 0530 by Gilberto Tinnie KIDD, RN Outcome: Progressing 12/06/2023 0529 by Gilberto Tinnie KIDD, RN Outcome: Progressing 12/06/2023 0529 by Gilberto Tinnie KIDD, RN Outcome: Progressing 12/06/2023 0345 by Gilberto Tinnie KIDD, RN Outcome: Progressing   Problem: Health Behavior/Discharge Planning: Goal: Ability to manage health-related needs will improve 12/06/2023 0531 by Gilberto Tinnie KIDD, RN Outcome: Progressing 12/06/2023 0530 by Gilberto Tinnie KIDD, RN Outcome: Progressing 12/06/2023 0529 by Gilberto Tinnie KIDD, RN Outcome: Progressing 12/06/2023 0529 by Gilberto Tinnie KIDD, RN Outcome: Progressing 12/06/2023 0345 by Gilberto Tinnie KIDD, RN Outcome: Progressing   Problem: Clinical Measurements: Goal: Ability to maintain clinical measurements within normal limits will improve 12/06/2023 0531 by Gilberto Tinnie KIDD, RN Outcome: Progressing 12/06/2023 0530 by Gilberto Tinnie KIDD, RN Outcome: Progressing 12/06/2023 0529 by Gilberto Tinnie KIDD, RN Outcome: Progressing 12/06/2023 0529 by Gilberto Tinnie KIDD, RN Outcome: Progressing 12/06/2023 0345 by Gilberto Tinnie KIDD, RN Outcome: Progressing Goal: Will remain free from infection 12/06/2023 0531 by Gilberto Tinnie KIDD, RN Outcome: Progressing 12/06/2023 0530 by Gilberto Tinnie KIDD, RN Outcome: Progressing 12/06/2023 0529 by Gilberto Tinnie KIDD, RN Outcome: Progressing 12/06/2023 0529 by Gilberto Tinnie KIDD, RN Outcome: Progressing 12/06/2023 0345 by Gilberto Tinnie KIDD, RN Outcome: Progressing Goal: Diagnostic test results will improve 12/06/2023 0531 by Gilberto Tinnie KIDD, RN Outcome: Progressing 12/06/2023 0530 by Gilberto Tinnie KIDD, RN Outcome: Progressing 12/06/2023 0529 by Gilberto Tinnie KIDD, RN Outcome: Progressing 12/06/2023 0529 by  Gilberto Tinnie KIDD, RN Outcome: Progressing 12/06/2023 0345 by Gilberto Tinnie KIDD, RN Outcome: Progressing Goal: Respiratory complications will improve 12/06/2023 0531 by Gilberto Tinnie KIDD, RN Outcome: Progressing 12/06/2023 0530 by Gilberto Tinnie KIDD, RN Outcome: Progressing 12/06/2023 0529 by Gilberto Tinnie KIDD, RN Outcome: Progressing 12/06/2023 0529 by Gilberto Tinnie KIDD, RN Outcome: Progressing 12/06/2023 0345 by Gilberto Tinnie KIDD, RN Outcome: Progressing Goal: Cardiovascular complication will be avoided 12/06/2023 0531 by Gilberto Tinnie KIDD, RN Outcome: Progressing 12/06/2023 0530 by Gilberto Tinnie KIDD, RN Outcome: Progressing 12/06/2023 0529 by Gilberto Tinnie KIDD, RN Outcome: Progressing 12/06/2023 0529 by Gilberto Tinnie KIDD, RN Outcome: Progressing 12/06/2023 0345 by Gilberto Tinnie KIDD, RN Outcome: Progressing   Problem: Activity: Goal: Risk for activity intolerance will decrease 12/06/2023 0531 by Gilberto Tinnie KIDD, RN Outcome: Progressing 12/06/2023 0530 by Gilberto Tinnie KIDD, RN Outcome: Progressing 12/06/2023 0529 by Gilberto Tinnie KIDD, RN Outcome: Progressing 12/06/2023 0529 by Gilberto Tinnie KIDD, RN Outcome: Progressing 12/06/2023 0345 by Gilberto Tinnie KIDD, RN Outcome: Progressing   Problem: Nutrition: Goal: Adequate nutrition will be maintained 12/06/2023 0531 by Gilberto Tinnie KIDD, RN Outcome: Progressing 12/06/2023 0530 by Gilberto Tinnie KIDD, RN Outcome: Progressing 12/06/2023 0529 by Gilberto Tinnie KIDD, RN Outcome: Progressing 12/06/2023 0529 by Gilberto Tinnie KIDD, RN Outcome: Progressing 12/06/2023 0345 by Gilberto Tinnie KIDD, RN Outcome: Progressing   Problem: Coping: Goal: Level of anxiety will decrease 12/06/2023 0531 by Gilberto Tinnie KIDD, RN Outcome: Progressing 12/06/2023 0530 by Gilberto Tinnie KIDD, RN Outcome: Progressing 12/06/2023 0529 by Gilberto Tinnie KIDD, RN Outcome: Progressing 12/06/2023 0529 by Gilberto Tinnie KIDD, RN Outcome: Progressing 12/06/2023 0345 by Gilberto Tinnie KIDD, RN Outcome:  Progressing   Problem: Elimination: Goal: Will not experience complications related to bowel motility 12/06/2023 0531 by Gilberto Tinnie KIDD, RN Outcome: Progressing 12/06/2023 0530 by Gilberto Tinnie KIDD, RN Outcome: Progressing 12/06/2023 0529 by Gilberto Tinnie KIDD, RN Outcome: Progressing 12/06/2023 0529 by  Gilberto Tinnie KIDD, RN Outcome: Progressing 12/06/2023 0345 by Gilberto Tinnie KIDD, RN Outcome: Progressing Goal: Will not experience complications related to urinary retention 12/06/2023 0531 by Gilberto Tinnie KIDD, RN Outcome: Progressing 12/06/2023 0530 by Gilberto Tinnie KIDD, RN Outcome: Progressing 12/06/2023 0529 by Gilberto Tinnie KIDD, RN Outcome: Progressing 12/06/2023 0529 by Gilberto Tinnie KIDD, RN Outcome: Progressing 12/06/2023 0345 by Gilberto Tinnie KIDD, RN Outcome: Progressing   Problem: Pain Managment: Goal: General experience of comfort will improve and/or be controlled 12/06/2023 0531 by Gilberto Tinnie KIDD, RN Outcome: Progressing 12/06/2023 0530 by Gilberto Tinnie KIDD, RN Outcome: Progressing 12/06/2023 0529 by Gilberto Tinnie KIDD, RN Outcome: Progressing 12/06/2023 0529 by Gilberto Tinnie KIDD, RN Outcome: Progressing 12/06/2023 0345 by Gilberto Tinnie KIDD, RN Outcome: Progressing   Problem: Safety: Goal: Ability to remain free from injury will improve 12/06/2023 0531 by Gilberto Tinnie KIDD, RN Outcome: Progressing 12/06/2023 0530 by Gilberto Tinnie KIDD, RN Outcome: Progressing 12/06/2023 0529 by Gilberto Tinnie KIDD, RN Outcome: Progressing 12/06/2023 0529 by Gilberto Tinnie KIDD, RN Outcome: Progressing 12/06/2023 0345 by Gilberto Tinnie KIDD, RN Outcome: Progressing   Problem: Skin Integrity: Goal: Risk for impaired skin integrity will decrease 12/06/2023 0531 by Gilberto Tinnie KIDD, RN Outcome: Progressing 12/06/2023 0530 by Gilberto Tinnie KIDD, RN Outcome: Progressing 12/06/2023 0529 by Gilberto Tinnie KIDD, RN Outcome: Progressing 12/06/2023 0529 by Gilberto Tinnie KIDD, RN Outcome: Progressing 12/06/2023 0345 by Gilberto Tinnie KIDD, RN Outcome: Progressing

## 2023-12-06 NOTE — Plan of Care (Deleted)
  Problem: Education: Goal: Knowledge of General Education information will improve Description: Including pain rating scale, medication(s)/side effects and non-pharmacologic comfort measures 12/06/2023 0529 by Gilberto Tinnie KIDD, RN Outcome: Progressing 12/06/2023 0529 by Gilberto Tinnie KIDD, RN Outcome: Progressing 12/06/2023 0345 by Gilberto Tinnie KIDD, RN Outcome: Progressing   Problem: Health Behavior/Discharge Planning: Goal: Ability to manage health-related needs will improve 12/06/2023 0529 by Gilberto Tinnie KIDD, RN Outcome: Progressing 12/06/2023 0529 by Gilberto Tinnie KIDD, RN Outcome: Progressing 12/06/2023 0345 by Gilberto Tinnie KIDD, RN Outcome: Progressing   Problem: Clinical Measurements: Goal: Ability to maintain clinical measurements within normal limits will improve 12/06/2023 0529 by Gilberto Tinnie KIDD, RN Outcome: Progressing 12/06/2023 0529 by Gilberto Tinnie KIDD, RN Outcome: Progressing 12/06/2023 0345 by Gilberto Tinnie KIDD, RN Outcome: Progressing Goal: Will remain free from infection 12/06/2023 0529 by Gilberto Tinnie KIDD, RN Outcome: Progressing 12/06/2023 0529 by Gilberto Tinnie KIDD, RN Outcome: Progressing 12/06/2023 0345 by Gilberto Tinnie KIDD, RN Outcome: Progressing Goal: Diagnostic test results will improve 12/06/2023 0529 by Gilberto Tinnie KIDD, RN Outcome: Progressing 12/06/2023 0529 by Gilberto Tinnie KIDD, RN Outcome: Progressing 12/06/2023 0345 by Gilberto Tinnie KIDD, RN Outcome: Progressing Goal: Respiratory complications will improve 12/06/2023 0529 by Gilberto Tinnie KIDD, RN Outcome: Progressing 12/06/2023 0529 by Gilberto Tinnie KIDD, RN Outcome: Progressing 12/06/2023 0345 by Gilberto Tinnie KIDD, RN Outcome: Progressing Goal: Cardiovascular complication will be avoided 12/06/2023 0529 by Gilberto Tinnie KIDD, RN Outcome: Progressing 12/06/2023 0529 by Gilberto Tinnie KIDD, RN Outcome: Progressing 12/06/2023 0345 by Gilberto Tinnie KIDD, RN Outcome: Progressing   Problem: Activity: Goal: Risk for  activity intolerance will decrease 12/06/2023 0529 by Gilberto Tinnie KIDD, RN Outcome: Progressing 12/06/2023 0529 by Gilberto Tinnie KIDD, RN Outcome: Progressing 12/06/2023 0345 by Gilberto Tinnie KIDD, RN Outcome: Progressing   Problem: Nutrition: Goal: Adequate nutrition will be maintained 12/06/2023 0529 by Gilberto Tinnie KIDD, RN Outcome: Progressing 12/06/2023 0529 by Gilberto Tinnie KIDD, RN Outcome: Progressing 12/06/2023 0345 by Gilberto Tinnie KIDD, RN Outcome: Progressing   Problem: Coping: Goal: Level of anxiety will decrease 12/06/2023 0529 by Gilberto Tinnie KIDD, RN Outcome: Progressing 12/06/2023 0529 by Gilberto Tinnie KIDD, RN Outcome: Progressing 12/06/2023 0345 by Gilberto Tinnie KIDD, RN Outcome: Progressing   Problem: Elimination: Goal: Will not experience complications related to bowel motility 12/06/2023 0529 by Gilberto Tinnie KIDD, RN Outcome: Progressing 12/06/2023 0529 by Gilberto Tinnie KIDD, RN Outcome: Progressing 12/06/2023 0345 by Gilberto Tinnie KIDD, RN Outcome: Progressing Goal: Will not experience complications related to urinary retention 12/06/2023 0529 by Gilberto Tinnie KIDD, RN Outcome: Progressing 12/06/2023 0529 by Gilberto Tinnie KIDD, RN Outcome: Progressing 12/06/2023 0345 by Gilberto Tinnie KIDD, RN Outcome: Progressing   Problem: Pain Managment: Goal: General experience of comfort will improve and/or be controlled 12/06/2023 0529 by Gilberto Tinnie KIDD, RN Outcome: Progressing 12/06/2023 0529 by Gilberto Tinnie KIDD, RN Outcome: Progressing 12/06/2023 0345 by Gilberto Tinnie KIDD, RN Outcome: Progressing   Problem: Safety: Goal: Ability to remain free from injury will improve 12/06/2023 0529 by Gilberto Tinnie KIDD, RN Outcome: Progressing 12/06/2023 0529 by Gilberto Tinnie KIDD, RN Outcome: Progressing 12/06/2023 0345 by Gilberto Tinnie KIDD, RN Outcome: Progressing   Problem: Skin Integrity: Goal: Risk for impaired skin integrity will decrease 12/06/2023 0529 by Gilberto Tinnie KIDD, RN Outcome:  Progressing 12/06/2023 0529 by Gilberto Tinnie KIDD, RN Outcome: Progressing 12/06/2023 0345 by Gilberto Tinnie KIDD, RN Outcome: Progressing

## 2023-12-06 NOTE — Progress Notes (Signed)
 [facility] PROGRESS NOTE    Progress Note  Calvin Escobar  FMW:[id number] DOB: 1950/11/09 DOA: 12/02/2023 PCP: [name], MD    Brief Narrative:   Calvin Escobar is an 73 y.o. male past medical history significant for hyperlipidemia, systolic heart failure, history of colovesical fistula urinary retention anemia of chronic disease bipolar disorder dementia came in complaining of hematuria urology placed a Foley catheter.  Blood cultures obtained on 11/20/2022 came back for Candida patient called back for further evaluation  Assessment/Plan:   Candidemia: Blood cultures obtained on 11/20/2023 came positive for Candida. ID was consulted repeated blood cultures showing no growth till date. Continue micafungin . Ophthalmology evaluation showed no ocular involvement. Awaiting sensitivities for Candida. ID on board they recommended to transition to oral fluconazole  upon discharge. Consult PT OT  Hematuria/BPH/urinary retention: Patient developed urinary retention secondary to BPH and urethral stricture. Foley catheter was placed by urology 2 days prior to admission. Hematuria is resolved. CT scan showed possible cystitis associated with left hydronephrosis urology recommended to follow-up with them as an outpatient. He will need to be discharged with Foley catheter. He will need to follow-up with colorectal surgery as an outpatient. GI was consulted and performed colonoscopy on 12/05/2023 that showed diverticulosis with several luminal narrowings in the distal sigmoid with severe diverticular disease which I suspect at the site of the fistula area.  Okay to proceed with colorectal surgery.  Acute kidney injury on chronic kidney disease stage IIIa: With a baseline creatinine of like around 1 admission 1.8. Likely hemodynamically mediated resolved with IV fluids.  Anemia of chronic disease: Hemoglobin stable.  Dementia: Continue delirium precaution.  Anxiety/bipolar  disorder: Continue Abilify , BuSpar , mirtazapine  and Zanaflex.  Hyperlipidemia: Continue statins.  History of colovesicular fistula: With recurrent bacteriuria in process of being evaluated by general surgery as an outpatient for resection. Will follow up with surgery as an outpatient.    DVT prophylaxis: lovenox  Family Communication: Wife Status is: Inpatient Remains inpatient appropriate because: Candidemia    Code Status:     Code Status Orders  (From admission, onward)           Start     Ordered   12/02/23 1123  Do not attempt resuscitation (DNR)- Limited -Do Not Intubate (DNI)  Continuous       Question Answer Comment  If pulseless and not breathing No CPR or chest compressions.   In Pre-Arrest Conditions (Patient Is Breathing and Has A Pulse) Do not intubate. Provide all appropriate non-invasive medical interventions. Avoid ICU transfer unless indicated or required.   Consent: Discussion documented in EHR or advanced directives reviewed      12/02/23 1123           Code Status History     Date Active Date Inactive Code Status Order ID Comments User Context   07/13/2023 0008 07/19/2023 1918 Limited: Do not attempt resuscitation (DNR) -DNR-LIMITED -Do Not Intubate/DNI  [id number]  Marcene Eva NOVAK, DO ED   07/02/2023 1539 07/05/2023 1830 Limited: Do not attempt resuscitation (DNR) -DNR-LIMITED -Do Not Intubate/DNI  [id number]  Esequiel Rosaline GRADE, NP ED   07/01/2023 2258 07/02/2023 1539 Full Code [id number]  Moody Alto, MD ED   06/02/2023 0912 06/11/2023 1610 Full Code [id number]  Waddell Rake, MD ED   06/02/2023 0839 06/02/2023 0912 Full Code [id number]  Waddell Rake, MD ED   05/13/2023 0647 05/16/2023 2326 Full Code [id number]  Alfornia Madison, MD ED   01/29/2016 1339 02/03/2016 2354 Full Code [id number]  Cinderella Goldman, MD ED      Advance Directive Documentation    Flowsheet Row Most Recent Value  Type of Advance Directive Living will  Pre-existing out of  facility DNR order (yellow form or pink MOST form) --  MOST Form in Place? --      IV Access:   Peripheral IV   Procedures and diagnostic studies:   No results found.   Medical Consultants:   None.   Subjective:    Calvin Escobar no complaints.  Objective:    Vitals:   12/05/23 1248 12/05/23 1629 12/05/23 2045 12/06/23 0421  BP: 124/79 127/72 131/85 (!) 94/49  Pulse: 82 82 94 66  Resp: 19 19 20 16   Temp: 98 F (36.7 C) 98.1 F (36.7 C) 97.9 F (36.6 C) 98 F (36.7 C)  TempSrc:      SpO2: 96% 99% 97% 95%  Weight:      Height:       SpO2: 95 % O2 Flow Rate (L/min): 6 L/min   Intake/Output Summary (Last 24 hours) at 12/06/2023 0710 Last data filed at 12/06/2023 0420 Gross per 24 hour  Intake 300 ml  Output 580 ml  Net -280 ml   Filed Weights   12/02/23 0520 12/05/23 0737  Weight: 108 kg 108 kg    Exam: General exam: In no acute distress. Respiratory system: Good air movement and clear to auscultation. Cardiovascular system: S1 & S2 heard, RRR. No JVD. Gastrointestinal system: Abdomen is nondistended, soft and nontender.  Extremities: No pedal edema. Skin: No rashes, lesions or ulcers Psychiatry: Judgement and insight appear normal. Mood & affect appropriate.  Data Reviewed:    Labs: Basic Metabolic Panel: Recent Labs  Lab 11/30/23 0941 12/02/23 0523 12/03/23 0429 12/04/23 0429 12/05/23 0308  NA 139 137 137 135 137  K 5.0 4.2 3.7 4.0 4.0  CL 106 107 108 105 107  CO2 25 22 24  21* 22  GLUCOSE 81 117* 109* 114* 97  BUN 31* 29* 25* 29* 28*  CREATININE 1.78* 1.88* 1.81* 1.86* 1.89*  CALCIUM  8.9 8.9 8.3* 8.3* 8.5*   GFR Estimated Creatinine Clearance: 42.9 mL/min (A) (by C-G formula based on SCr of 1.89 mg/dL (H)). Liver Function Tests: Recent Labs  Lab 11/30/23 0941  AST 11*  ALT 5  ALKPHOS 80  BILITOT <0.2  PROT 7.2  ALBUMIN  3.6   No results for input(s): LIPASE, AMYLASE in the last 168 hours. No results for  input(s): AMMONIA in the last 168 hours. Coagulation profile No results for input(s): INR, PROTIME in the last 168 hours. COVID-19 Labs  No results for input(s): DDIMER, FERRITIN, LDH, CRP in the last 72 hours.   Lab Results  Component Value Date   SARSCOV2NAA NEGATIVE 07/12/2023   SARSCOV2NAA NEGATIVE 07/01/2023   SARSCOV2NAA NEGATIVE 06/02/2023   SARSCOV2NAA NEGATIVE 07/01/2019    CBC: Recent Labs  Lab 11/30/23 0941 12/02/23 0523 12/03/23 0429 12/04/23 0429 12/05/23 0308  WBC 5.8 8.5 8.4 9.6 9.2  NEUTROABS 3.0  --   --   --   --   HGB 9.6* 10.7* 9.4* 9.6* 9.0*  HCT 32.7* 34.7* 30.0* 30.2* 28.7*  MCV 98.2 93.0 91.5 91.0 90.8  PLT 177 200 187 184 195   Cardiac Enzymes: No results for input(s): CKTOTAL, CKMB, CKMBINDEX, TROPONINI in the last 168 hours. BNP (last 3 results) No results for input(s): PROBNP in the last 8760 hours. CBG: Recent Labs  Lab 12/03/23 1735  GLUCAP 139*   D-Dimer:  No results for input(s): DDIMER in the last 72 hours. Hgb A1c: No results for input(s): HGBA1C in the last 72 hours. Lipid Profile: No results for input(s): CHOL, HDL, LDLCALC, TRIG, CHOLHDL, LDLDIRECT in the last 72 hours. Thyroid  function studies: No results for input(s): TSH, T4TOTAL, T3FREE, THYROIDAB in the last 72 hours.  Invalid input(s): FREET3 Anemia work up: No results for input(s): VITAMINB12, FOLATE, FERRITIN, TIBC, IRON, RETICCTPCT in the last 72 hours. Sepsis Labs: Recent Labs  Lab 12/02/23 0523 12/02/23 0803 12/02/23 1019 12/03/23 0429 12/04/23 0429 12/05/23 0308  WBC 8.5  --   --  8.4 9.6 9.2  LATICACIDVEN  --  0.7 0.6  --   --   --    Microbiology Recent Results (from the past 240 hours)  Blood culture (routine x 2)     Status: None (Preliminary result)   Collection Time: 12/02/23  7:40 AM   Specimen: BLOOD LEFT ARM  Result Value Ref Range Status   Specimen Description BLOOD LEFT ARM   Final   Special Requests   Final    BOTTLES DRAWN AEROBIC AND ANAEROBIC Blood Culture results may not be optimal due to an inadequate volume of blood received in culture bottles   Culture   Final    NO GROWTH 4 DAYS Performed at [facility], [address], [place], [place] [address]    Report Status PENDING  Incomplete  Blood culture (routine x 2)     Status: None (Preliminary result)   Collection Time: 12/02/23  7:41 AM   Specimen: BLOOD  Result Value Ref Range Status   Specimen Description BLOOD RIGHT ANTECUBITAL  Final   Special Requests   Final    BOTTLES DRAWN AEROBIC AND ANAEROBIC Blood Culture adequate volume   Culture   Final    NO GROWTH 4 DAYS Performed at [facility], [address] [place], [place] [address]    Report Status PENDING  Incomplete  Fungus culture, blood     Status: None (Preliminary result)   Collection Time: 12/02/23  2:08 PM   Specimen: BLOOD  Result Value Ref Range Status   Specimen Description BLOOD SITE NOT SPECIFIED  Final   Special Requests   Final    BOTTLES DRAWN AEROBIC ONLY Blood Culture adequate volume   Culture   Final    NO GROWTH 4 DAYS Performed at [facility], [address], [place], [place] [address]    Report Status PENDING  Incomplete     Medications:    ARIPiprazole   2 mg Oral q AM   busPIRone   10 mg Oral TID   carvedilol   12.5 mg Oral BID   Chlorhexidine  Gluconate Cloth  6 each Topical Daily   finasteride   5 mg Oral Daily   folic acid   1 mg Oral Daily   gabapentin   100 mg Oral TID   mirtazapine   30 mg Oral QHS   pravastatin   20 mg Oral QHS   primidone   50 mg Oral Q8H   senna-docusate  1 tablet Oral BID   sodium chloride  flush  3 mL Intravenous Q12H   tamsulosin   0.4 mg Oral Daily   traZODone   100 mg Oral QHS   venlafaxine  XR  150 mg Oral Daily   Continuous Infusions:  micafungin  (MYCAMINE ) 100 mg in sodium chloride  0.9 % 100 mL IVPB 100 mg (12/05/23 1113)      LOS: 4 days   Calvin Escobar  [facility]  12/06/2023, 7:10 AM

## 2023-12-07 ENCOUNTER — Encounter (HOSPITAL_COMMUNITY): Payer: Self-pay | Admitting: Gastroenterology

## 2023-12-07 DIAGNOSIS — B377 Candidal sepsis: Secondary | ICD-10-CM | POA: Diagnosis not present

## 2023-12-07 LAB — CULTURE, BLOOD (ROUTINE X 2)
Culture: NO GROWTH
Culture: NO GROWTH
Special Requests: ADEQUATE

## 2023-12-07 MED ORDER — FLUCONAZOLE 200 MG PO TABS: 400.0000 mg | ORAL_TABLET | Freq: Every day | ORAL | 0 refills | Status: AC

## 2023-12-07 NOTE — Evaluation (Signed)
 Occupational Therapy Evaluation Patient Details Name: Calvin Escobar MRN: [id number] DOB: 11-29-1950 Today's Date: 12/07/2023   History of Present Illness   73 y.o. male presents to ED from home 6/28 for hematuria. Urology placed a Foley catheter. Found to have candidemia. CT of abdomen worrisome for a chronic colovesical fistula.s/p colonoscopy on 12/05/2023 that showed diverticulosis with several luminal narrowings in the distal sigmoid with severe diverticular disease PMH: hyperlipidemia, systolic heart failure, history of colovesical fistula urinary retention anemia of chronic disease bipolar disorder dementia     Clinical Impressions Pt admitted for above, PTA pt reports being ind with his ADLs/iADLs. He currently presents with BLE weakness, ambulating with CGA in room no AD for steps at bedside. Pt deferred ambulating, reports frustrations with needing to perform a procedure as outpatient, employed a fair bit of therapeutic listenting. Mentioned this to MD at pt request to further explain the process to pt. He currently demonstrates ability to complete ADLS with CGA to setup A, declined the need for a shower seat. OT to follow pt acutely to ensure progress to baseline. No post acute OT needs.      If plan is discharge home, recommend the following:         Functional Status Assessment   Patient has not had a recent decline in their functional status     Equipment Recommendations   None recommended by OT     Recommendations for Other Services         Precautions/Restrictions   Precautions Precautions: Fall Recall of Precautions/Restrictions: Intact Precaution/Restrictions Comments: indwelling urinary catheter Restrictions Weight Bearing Restrictions Per Provider Order: No     Mobility Bed Mobility               General bed mobility comments: Pt EOB on arrival    Transfers Overall transfer level: Modified independent Equipment used: None                       Balance Overall balance assessment: Needs assistance Sitting-balance support: Feet supported Sitting balance-Leahy Scale: Normal     Standing balance support: During functional activity, No upper extremity supported, Bilateral upper extremity supported Standing balance-Leahy Scale: Fair                             ADL either performed or assessed with clinical judgement   ADL Overall ADL's : Needs assistance/impaired Eating/Feeding: Independent;Sitting Eating/Feeding Details (indicate cue type and reason): eating EOB Grooming: Standing;Contact guard assist   Upper Body Bathing: Contact guard assist;Standing   Lower Body Bathing: Sit to/from stand;Contact guard assist   Upper Body Dressing : Sitting;Set up   Lower Body Dressing: Sitting/lateral leans;Set up   Toilet Transfer: Contact guard assist;Ambulation   Toileting- Clothing Manipulation and Hygiene: Contact guard assist;Sit to/from stand       Functional mobility during ADLs: Contact guard assist       Vision         Perception         Praxis         Pertinent Vitals/Pain       Extremity/Trunk Assessment Upper Extremity Assessment Upper Extremity Assessment: Overall WFL for tasks assessed       Cervical / Trunk Assessment Cervical / Trunk Assessment: Kyphotic   Communication Communication Communication: No apparent difficulties Factors Affecting Communication: Other (comment) (sloweed speech)   Cognition Arousal: Alert Behavior During Therapy: WFL for tasks assessed/performed  Cognition: No apparent impairments             OT - Cognition Comments: pt irritated that he must return for procedure as outpatient                 Following commands: Intact       Cueing  General Comments   Cueing Techniques: Verbal cues;Gestural cues;Visual cues;Tactile cues  Pt expressed irrtiation that he must return for kidney procedure as outpatient and although  stool is free from his urine the main problem still remains. Messaged MD per pt request to provide detailed explanation to pt when able.   Exercises     Shoulder Instructions      Home Living Family/patient expects to be discharged to:: Private residence Living Arrangements: Spouse/significant other Available Help at Discharge: Family;Available PRN/intermittently Type of Home: House Home Access: Stairs to enter [employer] of Steps: 8-9 Entrance Stairs-Rails: None Home Layout: Two level;Bed/bath upstairs Alternate Level Stairs-Number of Steps: flight Alternate Level Stairs-Rails: Right;Left Bathroom Shower/Tub: Producer, television/film/video: Standard     Home Equipment: Agricultural consultant (2 wheels)          Prior Functioning/Environment               Mobility Comments: ind without AD, drives ADLs Comments: spouse does meds management, finances,    OT Problem List: Decreased strength   OT Treatment/Interventions: Self-care/ADL training;Therapeutic exercise;Balance training;Therapeutic activities      OT Goals(Current goals can be found in the care plan section)   Acute Rehab OT Goals Patient Stated Goal: To clear up health issues OT Goal Formulation: With patient Time For Goal Achievement: 12/21/23 Potential to Achieve Goals: Good ADL Goals Pt Will Perform Grooming: with modified independence;standing Pt Will Perform Lower Body Bathing: with modified independence;sit to/from stand Pt Will Perform Lower Body Dressing: with modified independence;sit to/from stand   OT Frequency:  Min 2X/week    Co-evaluation              AM-PAC OT 6 Clicks Daily Activity     Outcome Measure Help from another person eating meals?: None Help from another person taking care of personal grooming?: A Little Help from another person toileting, which includes using toliet, bedpan, or urinal?: A Little Help from another person bathing (including washing,  rinsing, drying)?: A Little Help from another person to put on and taking off regular upper body clothing?: A Little Help from another person to put on and taking off regular lower body clothing?: A Little 6 Click Score: 19   End of Session Nurse Communication: Mobility status  Activity Tolerance: Patient tolerated treatment well Patient left: in bed;with call bell/phone within reach  OT Visit Diagnosis: Muscle weakness (generalized) (M62.81)                Time: 9172-9158 OT Time Calculation (min): 14 min Charges:  OT General Charges $OT Visit: 1 Visit OT Evaluation $OT Eval Low Complexity: 1 Low  12/07/2023  AB, OTR/L  Acute Rehabilitation Services  Office: [phone]   Curtistine JONETTA Das 12/07/2023, 9:40 AM

## 2023-12-07 NOTE — Plan of Care (Signed)

## 2023-12-07 NOTE — Progress Notes (Signed)
 DISCHARGE NOTE HOME [place] to be discharged Home per MD order. Discussed prescriptions and follow up appointments with the patient. Prescriptions given to patient; medication list explained in detail. Patient verbalized understanding.  Skin clean, dry and intact without evidence of skin break down, no evidence of skin tears noted. IV catheter discontinued intact. Site without signs and symptoms of complications. Dressing and pressure applied. Pt denies pain at the site currently. No complaints noted.  Discharging with foley catheter Patient free of other lines, drains, and wounds.   An After Visit Summary (AVS) was printed and given to the patient. Patient escorted via wheelchair, and discharged home via private auto.  Peyton SHAUNNA Pepper, RN

## 2023-12-07 NOTE — Discharge Summary (Signed)
 Physician Discharge Summary  Calvin Escobar [facility] FMW:[id number] DOB: 02-Apr-1951 DOA: 12/02/2023  PCP: [name], MD  Admit date: 12/02/2023 Discharge date: 12/07/2023  Admitted From: Home Disposition:  Home  Recommendations for Outpatient Follow-up:  Follow up with general surgery in 1-2 weeks for further evaluation and scheduling of fistula repair. Follow-up with urology for Foley catheter follow-up. Please obtain BMP/CBC in one week   Home Health:No Equipment/Devices:None  Discharge Condition:Stable CODE STATUS:Full Diet recommendation: Heart Healthy  Brief/Interim Summary: 73 y.o. male past medical history significant for hyperlipidemia, systolic heart failure, history of colovesical fistula urinary retention anemia of chronic disease bipolar disorder dementia came in complaining of hematuria urology placed a Foley catheter.  Blood cultures obtained on 11/20/2022 came back for Candida patient called back for further evaluation    Discharge Diagnoses:  Principal Problem:   Candidemia (HCC) Active Problems:   Hematuria   BPH with urinary obstruction   Acute kidney injury superimposed on chronic kidney disease (HCC)   Anemia of chronic disease   Dementia (HCC)   Anxiety   Bipolar disorder (HCC)   HLD (hyperlipidemia)   Colovesical fistula   Obesity (BMI 30-39.9)   Colonization with VRE (vancomycin -resistant enterococcus)  Candidemia: Blood cultures obtained on 11/20/2023 was positive for Candida. ID was consulted repeated blood culture was negative till date. He was continued on micafungin  ophthalmology was consulted and showed no ocular involvement. ID recommended to change micafungin  to fluconazole  for 14 days. PT evaluated the patient recommended home health PT.  Hematuria/BPH/urinary retention: Foley catheter was placed by urology 2 days prior to admission his hematuria resolved. CT showed possible cystitis with left hydronephrosis urology recommended to  follow-up with them as an outpatient. He will be discharged with the Foley catheter. He needs follow-up with colorectal surgery as an outpatient. GI was consulted to perform a colonoscopy in 12/05/2023 that showed diverticulosis with severe luminal narrowing in the distal sigmoid which they suspect is the fistula area.  Acute kidney injury on chronic kidney disease stage IIIa: With a baseline creatinine of 1 on admission 1.8 likely hemodynamically mediated. His creatinine improved.  Anemia of chronic renal disease: His hemoglobin remained stable.  Dementia: Continue delirium precaution.  Anxiety/bipolar disorder: Continue Abilify  BuSpar  mirtazapine  and Zanaflex.  Hyperlipidemia: Excellent continue statins.  History of colovesicular fistula: Being evaluated as an outpatient by general surgery for outpatient resection. Will continue his appointment.  Discharge Instructions  Discharge Instructions     Diet - low sodium heart healthy   Complete by: As directed    Increase activity slowly   Complete by: As directed       Allergies as of 12/07/2023       Reactions   Albuterol  Palpitations   Supraventricular Tachycardia         Medication List     STOP taking these medications    ferrous sulfate  325 (65 FE) MG tablet   Na Sulfate-K Sulfate-Mg Sulfate concentrate 17.5-3.13-1.6 GM/177ML Soln Commonly known as: SUPREP       TAKE these medications    ARIPiprazole  2 MG tablet Commonly known as: ABILIFY  Take 1 tablet (2 mg total) by mouth in the morning.   busPIRone  10 MG tablet Commonly known as: BUSPAR  Take 1 tablet (10 mg total) by mouth 3 (three) times daily.   carvedilol  12.5 MG tablet Commonly known as: COREG  Take 12.5 mg by mouth 2 (two) times daily.   finasteride  5 MG tablet Commonly known as: PROSCAR  Take 5 mg by mouth daily.  fluconazole  200 MG tablet Commonly known as: DIFLUCAN  Take 2 tablets (400 mg total) by mouth daily for 14 days. What  changed:  how much to take additional instructions   folic acid  1 MG tablet Commonly known as: FOLVITE  Take 1 mg by mouth daily.   gabapentin  100 MG capsule Commonly known as: NEURONTIN  Take 1 capsule (100 mg total) by mouth 3 (three) times daily.   losartan  50 MG tablet Commonly known as: COZAAR  Take 50 mg by mouth daily.   melatonin 3 MG Tabs tablet Take 1 tablet (3 mg total) by mouth at bedtime as needed (insomnia). What changed: when to take this   midodrine  5 MG tablet Commonly known as: PROAMATINE  Take 1 tablet (5 mg total) by mouth 3 (three) times daily with meals.   mirtazapine  30 MG tablet Commonly known as: REMERON  Take 1 tablet (30 mg total) by mouth at bedtime.   pravastatin  20 MG tablet Commonly known as: PRAVACHOL  Take 1 tablet (20 mg total) by mouth at bedtime.   primidone  50 MG tablet Commonly known as: MYSOLINE  Take 1 tablet (50 mg total) by mouth 2 (two) times daily. What changed: when to take this   tamsulosin  0.4 MG Caps capsule Commonly known as: FLOMAX  TAKE 1 CAPSULE BY MOUTH EVERY DAY   traZODone  100 MG tablet Commonly known as: DESYREL  Take 1 tablet (100 mg total) by mouth at bedtime.   venlafaxine  XR 150 MG 24 hr capsule Commonly known as: EFFEXOR -XR Take 150 mg by mouth daily.        Follow-up Information     [facility] Follow up.   Specialty: Home Health Services Why: [facility] will contact you for the first home visit. Contact information: [address] [place] [address] [phone]                Allergies  Allergen Reactions   Albuterol  Palpitations    Supraventricular Tachycardia     Consultations: Neurology ID   Procedures/Studies: CT ABDOMEN PELVIS W CONTRAST Result Date: 12/02/2023 CLINICAL DATA:  Abdominal pain, hematuria. EXAM: CT ABDOMEN AND PELVIS WITH CONTRAST TECHNIQUE: Multidetector CT imaging of the abdomen and pelvis was performed using the standard protocol  following bolus administration of intravenous contrast. RADIATION DOSE REDUCTION: This exam was performed according to the departmental dose-optimization program which includes automated exposure control, adjustment of the mA and/or kV according to patient size and/or use of iterative reconstruction technique. CONTRAST:  75mL OMNIPAQUE  IOHEXOL  350 MG/ML SOLN COMPARISON:  06/02/2023. FINDINGS: Lower chest: No acute findings. Heart is at the upper limits of normal in size with left ventricular dilatation. No pericardial or pleural effusion. Distal esophagus is grossly unremarkable. Hepatobiliary: Liver and gallbladder are unremarkable. No biliary ductal dilatation. Pancreas: Negative. Spleen: Negative. Adrenals/Urinary Tract: Scarring in the right kidney. Low-attenuation lesions in the kidneys. No specific follow-up necessary. Left renal edema with mild left hydronephrosis which appears to be secondary to marked bladder wall thickening. Right ureter is decompressed. Foley catheter and air are seen in the bladder. There is haziness and fluid surrounding the bladder. Stomach/Bowel: Stomach, small bowel, appendix and majority of the colon are unremarkable. There appears to be a low-attenuation tract extending from the rectosigmoid colon to the posterior margin of the bladder (3/67-70). Vascular/Lymphatic: Atherosclerotic calcification of the aorta. No pathologically enlarged lymph nodes. Reproductive: Prostate is visualized. Other: No free fluid.  Mesenteries and peritoneum are unremarkable. Musculoskeletal: Degenerative changes in the spine. IMPRESSION: 1. Marked bladder wall thickening with  surrounding edema and fluid, indicative of severe cystitis. Associated mild left hydronephrosis. 2. Suspected tract from the sigmoid colon to the posterior bladder wall, worrisome for a chronic colovesical fistula. 3.  Aortic atherosclerosis (ICD10-I70.0). Electronically Signed   By: Newell Eke M.D.   On: 12/02/2023 09:00      Subjective: No complaints  Discharge Exam: Vitals:   12/07/23 0000 12/07/23 0431  BP: (!) 116/56 114/70  Pulse: 68 62  Resp: 18 18  Temp: 99 F (37.2 C) 98.6 F (37 C)  SpO2: 93% 96%   Vitals:   12/06/23 1648 12/06/23 2031 12/07/23 0000 12/07/23 0431  BP: 120/79 125/81 (!) 116/56 114/70  Pulse: 70 72 68 62  Resp: 17 18 18 18   Temp: 97.8 F (36.6 C) 98.8 F (37.1 C) 99 F (37.2 C) 98.6 F (37 C)  TempSrc:  Oral Oral Oral  SpO2: 99% 99% 93% 96%  Weight:      Height:        General: Pt is alert, awake, not in acute distress Cardiovascular: RRR, S1/S2 +, no rubs, no gallops Respiratory: CTA bilaterally, no wheezing, no rhonchi Abdominal: Soft, NT, ND, bowel sounds + Extremities: no edema, no cyanosis    The results of significant diagnostics from this hospitalization (including imaging, microbiology, ancillary and laboratory) are listed below for reference.     Microbiology: Recent Results (from the past 240 hours)  Blood culture (routine x 2)     Status: None (Preliminary result)   Collection Time: 12/02/23  7:40 AM   Specimen: BLOOD LEFT ARM  Result Value Ref Range Status   Specimen Description BLOOD LEFT ARM  Final   Special Requests   Final    BOTTLES DRAWN AEROBIC AND ANAEROBIC Blood Culture results may not be optimal due to an inadequate volume of blood received in culture bottles   Culture   Final    NO GROWTH 4 DAYS Performed at [facility], [address], [place], [place] [address]    Report Status PENDING  Incomplete  Blood culture (routine x 2)     Status: None (Preliminary result)   Collection Time: 12/02/23  7:41 AM   Specimen: BLOOD  Result Value Ref Range Status   Specimen Description BLOOD RIGHT ANTECUBITAL  Final   Special Requests   Final    BOTTLES DRAWN AEROBIC AND ANAEROBIC Blood Culture adequate volume   Culture   Final    NO GROWTH 4 DAYS Performed at [facility], [address], [place], [place] [address]     Report Status PENDING  Incomplete  Fungus culture, blood     Status: None (Preliminary result)   Collection Time: 12/02/23  2:08 PM   Specimen: BLOOD  Result Value Ref Range Status   Specimen Description BLOOD SITE NOT SPECIFIED  Final   Special Requests   Final    BOTTLES DRAWN AEROBIC ONLY Blood Culture adequate volume   Culture   Final    NO GROWTH 4 DAYS Performed at [facility], [address], [place], [place] [address]    Report Status PENDING  Incomplete     Labs: BNP (last 3 results) Recent Labs    05/13/23 0645 06/05/23 2157  BNP 83.1 168.9*   Basic Metabolic Panel: Recent Labs  Lab 11/30/23 0941 12/02/23 0523 12/03/23 0429 12/04/23 0429 12/05/23 0308  NA 139 137 137 135 137  K 5.0 4.2 3.7 4.0 4.0  CL 106 107 108 105 107  CO2 25 22 24  21* 22  GLUCOSE 81 117* 109* 114* 97  BUN 31* 29* 25* 29* 28*  CREATININE 1.78* 1.88* 1.81* 1.86* 1.89*  CALCIUM  8.9 8.9 8.3* 8.3* 8.5*   Liver Function Tests: Recent Labs  Lab 11/30/23 0941  AST 11*  ALT 5  ALKPHOS 80  BILITOT <0.2  PROT 7.2  ALBUMIN  3.6   No results for input(s): LIPASE, AMYLASE in the last 168 hours. No results for input(s): AMMONIA in the last 168 hours. CBC: Recent Labs  Lab 11/30/23 0941 12/02/23 0523 12/03/23 0429 12/04/23 0429 12/05/23 0308  WBC 5.8 8.5 8.4 9.6 9.2  NEUTROABS 3.0  --   --   --   --   HGB 9.6* 10.7* 9.4* 9.6* 9.0*  HCT 32.7* 34.7* 30.0* 30.2* 28.7*  MCV 98.2 93.0 91.5 91.0 90.8  PLT 177 200 187 184 195   Cardiac Enzymes: No results for input(s): CKTOTAL, CKMB, CKMBINDEX, TROPONINI in the last 168 hours. BNP: Invalid input(s): POCBNP CBG: Recent Labs  Lab 12/03/23 1735  GLUCAP 139*   D-Dimer No results for input(s): DDIMER in the last 72 hours. Hgb A1c No results for input(s): HGBA1C in the last 72 hours. Lipid Profile No results for input(s): CHOL, HDL, LDLCALC, TRIG, CHOLHDL, LDLDIRECT in the last 72  hours. Thyroid  function studies No results for input(s): TSH, T4TOTAL, T3FREE, THYROIDAB in the last 72 hours.  Invalid input(s): FREET3 Anemia work up No results for input(s): VITAMINB12, FOLATE, FERRITIN, TIBC, IRON, RETICCTPCT in the last 72 hours. Urinalysis    Component Value Date/Time   COLORURINE RED (A) 12/02/2023 0532   APPEARANCEUR TURBID (A) 12/02/2023 0532   LABSPEC  12/02/2023 0532    TEST NOT REPORTED DUE TO COLOR INTERFERENCE OF URINE PIGMENT   PHURINE  12/02/2023 0532    TEST NOT REPORTED DUE TO COLOR INTERFERENCE OF URINE PIGMENT   GLUCOSEU (A) 12/02/2023 0532    TEST NOT REPORTED DUE TO COLOR INTERFERENCE OF URINE PIGMENT   HGBUR (A) 12/02/2023 0532    TEST NOT REPORTED DUE TO COLOR INTERFERENCE OF URINE PIGMENT   BILIRUBINUR (A) 12/02/2023 0532    TEST NOT REPORTED DUE TO COLOR INTERFERENCE OF URINE PIGMENT   KETONESUR (A) 12/02/2023 0532    TEST NOT REPORTED DUE TO COLOR INTERFERENCE OF URINE PIGMENT   PROTEINUR (A) 12/02/2023 0532    TEST NOT REPORTED DUE TO COLOR INTERFERENCE OF URINE PIGMENT   UROBILINOGEN 0.2 09/12/2013 1446   NITRITE (A) 12/02/2023 0532    TEST NOT REPORTED DUE TO COLOR INTERFERENCE OF URINE PIGMENT   LEUKOCYTESUR (A) 12/02/2023 0532    TEST NOT REPORTED DUE TO COLOR INTERFERENCE OF URINE PIGMENT   Sepsis Labs Recent Labs  Lab 12/02/23 0523 12/03/23 0429 12/04/23 0429 12/05/23 0308  WBC 8.5 8.4 9.6 9.2   Microbiology Recent Results (from the past 240 hours)  Blood culture (routine x 2)     Status: None (Preliminary result)   Collection Time: 12/02/23  7:40 AM   Specimen: BLOOD LEFT ARM  Result Value Ref Range Status   Specimen Description BLOOD LEFT ARM  Final   Special Requests   Final    BOTTLES DRAWN AEROBIC AND ANAEROBIC Blood Culture results may not be optimal due to an inadequate volume of blood received in culture bottles   Culture   Final    NO GROWTH 4 DAYS Performed at [facility]  Lab, [address], [place], [place] [address]    Report  Status PENDING  Incomplete  Blood culture (routine x 2)     Status: None (Preliminary result)   Collection Time: 12/02/23  7:41 AM   Specimen: BLOOD  Result Value Ref Range Status   Specimen Description BLOOD RIGHT ANTECUBITAL  Final   Special Requests   Final    BOTTLES DRAWN AEROBIC AND ANAEROBIC Blood Culture adequate volume   Culture   Final    NO GROWTH 4 DAYS Performed at [facility], [address], [place], [place] [address]    Report Status PENDING  Incomplete  Fungus culture, blood     Status: None (Preliminary result)   Collection Time: 12/02/23  2:08 PM   Specimen: BLOOD  Result Value Ref Range Status   Specimen Description BLOOD SITE NOT SPECIFIED  Final   Special Requests   Final    BOTTLES DRAWN AEROBIC ONLY Blood Culture adequate volume   Culture   Final    NO GROWTH 4 DAYS Performed at [facility], [address], [place], [place] [address]    Report Status PENDING  Incomplete     Time coordinating discharge: Over 35 minutes  SIGNED:   Erle Odell Castor, MD  [facility] 12/07/2023, 7:22 AM Pager   If 7PM-7AM, please contact night-coverage [email or web address] Password [email or web address]

## 2023-12-08 LAB — YEAST SUSCEPTIBILITIES
Amphotericin B MIC: 0.5
Fluconazole Islt MIC: 0.5
ISAVUCONAZOLE MIC: 0.008
Itraconazole MIC: 0.12
Micafungin MIC: 0.015
Posaconazole MIC: 0.06
Voriconazole MIC: 0.015

## 2023-12-08 LAB — SPECIMEN STATUS REPORT

## 2023-12-09 ENCOUNTER — Telehealth: Payer: Self-pay | Admitting: *Deleted

## 2023-12-09 DIAGNOSIS — B377 Candidal sepsis: Secondary | ICD-10-CM

## 2023-12-09 LAB — FUNGUS CULTURE, BLOOD
Culture: NO GROWTH
Special Requests: ADEQUATE

## 2023-12-09 LAB — GLUCOSE, CAPILLARY: Glucose-Capillary: 143 mg/dL — ABNORMAL HIGH (ref 70–99)

## 2023-12-09 NOTE — Transitions of Care (Post Inpatient/ED Visit) (Signed)
 12/09/2023  Name: Calvin Escobar MRN: [id number] DOB: 1951/02/04  Today's TOC FU Call Status: Today's TOC FU Call Status:: Successful TOC FU Call Completed TOC FU Call Complete Date: 12/09/23 Patient's Name and Date of Birth confirmed.  Transition Care Management Follow-up Telephone Call Date of Discharge: 12/07/23 Discharge Facility: [facility]) Type of Discharge: Inpatient Admission Primary Inpatient Discharge Diagnosis:: Candidemia How have you been since you were released from the hospital?: Better (eating and drinking well) Any questions or concerns?: No  Items Reviewed: Did you receive and understand the discharge instructions provided?: Yes Medications obtained,verified, and reconciled?: No Medications Not Reviewed Reasons:: Other: (spouse provides all oversight for medications, she works full time, is not home, RNCM attempted to call spouse and no answer, unable to leave VM) Any new allergies since your discharge?: No Dietary orders reviewed?: Yes Type of Diet Ordered:: heart healthy   low sodium Do you have support at home?: Yes People in Home [RPT]:  (pt states adult son lives in the home but is not helpful) Name of Support/Comfort Primary Source: Levada  Medications Reviewed Today: Medications Reviewed Today   Medications were not reviewed in this encounter   Unable to medication reconciliation, did not speak with spouse, she is at work, unable to reach her, pt states spouse provides all oversight for medications.  Home Care and Equipment/Supplies: Were Home Health Services Ordered?: Yes Name of Home Health Agency:: [facility] Has Agency set up a time to come to your home?: No EMR reviewed for Home Health Orders: Orders present/patient has not received call (refer to CM for follow-up) (spoke with Cara at [facility], orders present, they will call pt this afternoon to schedule PT home visit for 7/8, pt verbalizes understanding.) Any new equipment or medical  supplies ordered?: No  Functional Questionnaire: Do you need assistance with bathing/showering or dressing?: No Do you need assistance with meal preparation?: No Do you need assistance with eating?: No Do you have difficulty maintaining continence: Yes (foley catheter) Do you need assistance with getting out of bed/getting out of a chair/moving?: No Do you have difficulty managing or taking your medications?: Yes (spouse assists)  Follow up appointments reviewed: PCP Follow-up appointment confirmed?: Yes (collaborated with care guide, post hospital appointment scheduled) MD Provider Line Number:[phone] Given: No Date of PCP follow-up appointment?: 12/16/23 Follow-up Provider: Dr. Bernardino Cone @ 1 pm, pt verbalizes understanding Specialist Hospital Follow-up appointment confirmed?: Yes Date of Specialist follow-up appointment?: 12/18/23 Follow-Up Specialty Provider:: cardiologist  Damien Braver  @ 130    Infectious disease  01/03/24 Cordella July  @ 9 am,  pt to follow up with general surgeon in 1-2 weeks Reason Specialist Follow-Up Not Confirmed: Patient has Specialist Provider Number and will Call for Appointment Do you need transportation to your follow-up appointment?:  (pt states spouse works full time and may not be able to take  him to all appointments) Transportation Need Intervention Addressed By:: AMB Referral For SDOH Needs Do you understand care options if your condition(s) worsen?: Yes-patient verbalized understanding  SDOH Interventions Today    Flowsheet Row Most Recent Value  SDOH Interventions   Food Insecurity Interventions Intervention Not Indicated  Housing Interventions Intervention Not Indicated  Transportation Interventions AMB Referral  [careguide referral]  Utilities Interventions Intervention Not Indicated    Goals Addressed             This Visit's Progress    VBCI Transitions of Care (TOC) Care Plan       Problems:  Recent Hospitalization for  treatment of Candidemia, pt reports no issues with foley catheter, states  urine is clear SDOH barrier: transportation Patient is able to drive but has problems getting to specialist's appointments, pt is able to drive to primary care provider office, patient's spouse works full time and provides oversight for all medications, assists as needed, adult son lives with pt but does not assist  Goal:  Over the next 30 days, the patient will not experience hospital readmission  Interventions:  Evaluation of current treatment plan related to Candidemia, self-management and patient's adherence to plan as established by provider. Discussed plans with patient for ongoing care management follow up and provided patient with direct contact information for care management team Collaborated with care guide, scheduled post hospital follow up appointment with primary care provider  12/16/23 @ 1 pm Reviewed all upcoming scheduled appointments including cardiology 7/16 and infectious disease 8/1, pt aware to scheduled appointment with general surgeon for 1-2 weeks out Reviewed signs/ symptoms of infection Placed care guide referral for transportation resources Reviewed care of foley cathter Reviewed hospital AVS with provider names, contact #, address for follow up appointments Telephone call with [facility], spoke with Deitra, they will see pt for home visit on 7/8 and call pt this afternoon to schedule a time, pt verbalizes understanding Reviewed importance of taking medications as prescribed,  unable to complete medication review due to spouse not at home, unable to reach spouse at work, unable to leave voicemail  Patient Self Care Activities:  Attend all scheduled provider appointments Attend church or other social activities Call pharmacy for medication refills 3-7 days in advance of running out of medications Call provider office for new concerns or questions  Notify RN Care Manager of TOC call  rescheduling needs Participate in Transition of Care Program/Attend TOC scheduled calls Perform all self care activities independently  Take medications as prescribed   Primary care provider appointment 12/16/23 @ 1 pm  Plan:  Telephone follow up appointment with care management team member scheduled for:  12/17/23 @ 1030 am The patient has been provided with contact information for the care management team and has been advised to call with any health related questions or concerns.         [facility], BSN RN Care Manager/ Transition of Care [place]/ [facility] [phone]

## 2023-12-09 NOTE — Telephone Encounter (Signed)
 Thank you for the update, and I received a message from Dr. Leigh regarding the inpatient consult and colonoscopy. I see that the outpatient colonoscopy was therefore canceled.  VEAR Brand MD

## 2023-12-10 ENCOUNTER — Telehealth: Payer: Self-pay

## 2023-12-10 NOTE — Telephone Encounter (Signed)
 review  Copied from CRM [id number]. Topic: Clinical - Home Health Verbal Orders >> Dec 10, 2023  2:34 PM Burnard DEL wrote: Caller/Agency: [facility] Callback Number: [phone] Service Requested: Physical Therapy  Move start of care to 12/11/2023,patient did not answer phone for services today

## 2023-12-11 ENCOUNTER — Other Ambulatory Visit: Payer: Self-pay

## 2023-12-11 ENCOUNTER — Encounter: Payer: Self-pay | Admitting: *Deleted

## 2023-12-11 ENCOUNTER — Emergency Department (HOSPITAL_COMMUNITY)

## 2023-12-11 ENCOUNTER — Inpatient Hospital Stay (HOSPITAL_COMMUNITY)
Admission: EM | Admit: 2023-12-11 | Discharge: 2023-12-19 | DRG: 871 | Disposition: A | Discharge status: Home Health | Attending: Internal Medicine | Admitting: Internal Medicine

## 2023-12-11 ENCOUNTER — Ambulatory Visit: Payer: Self-pay

## 2023-12-11 ENCOUNTER — Inpatient Hospital Stay (HOSPITAL_COMMUNITY)

## 2023-12-11 DIAGNOSIS — I447 Left bundle-branch block, unspecified: Secondary | ICD-10-CM | POA: Diagnosis present

## 2023-12-11 DIAGNOSIS — M7989 Other specified soft tissue disorders: Secondary | ICD-10-CM | POA: Diagnosis not present

## 2023-12-11 DIAGNOSIS — U071 COVID-19: Secondary | ICD-10-CM | POA: Diagnosis present

## 2023-12-11 DIAGNOSIS — Z792 Long term (current) use of antibiotics: Secondary | ICD-10-CM

## 2023-12-11 DIAGNOSIS — F03A4 Unspecified dementia, mild, with anxiety: Secondary | ICD-10-CM | POA: Diagnosis present

## 2023-12-11 DIAGNOSIS — I5022 Chronic systolic (congestive) heart failure: Secondary | ICD-10-CM | POA: Diagnosis not present

## 2023-12-11 DIAGNOSIS — R6520 Severe sepsis without septic shock: Secondary | ICD-10-CM | POA: Diagnosis present

## 2023-12-11 DIAGNOSIS — E785 Hyperlipidemia, unspecified: Secondary | ICD-10-CM | POA: Diagnosis present

## 2023-12-11 DIAGNOSIS — F0394 Unspecified dementia, unspecified severity, with anxiety: Secondary | ICD-10-CM | POA: Diagnosis not present

## 2023-12-11 DIAGNOSIS — R0682 Tachypnea, not elsewhere classified: Secondary | ICD-10-CM | POA: Diagnosis not present

## 2023-12-11 DIAGNOSIS — J9811 Atelectasis: Secondary | ICD-10-CM | POA: Diagnosis present

## 2023-12-11 DIAGNOSIS — R6510 Systemic inflammatory response syndrome (SIRS) of non-infectious origin without acute organ dysfunction: Secondary | ICD-10-CM

## 2023-12-11 DIAGNOSIS — N401 Enlarged prostate with lower urinary tract symptoms: Secondary | ICD-10-CM | POA: Diagnosis present

## 2023-12-11 DIAGNOSIS — N321 Vesicointestinal fistula: Secondary | ICD-10-CM | POA: Diagnosis not present

## 2023-12-11 DIAGNOSIS — F03A3 Unspecified dementia, mild, with mood disturbance: Secondary | ICD-10-CM | POA: Diagnosis present

## 2023-12-11 DIAGNOSIS — F319 Bipolar disorder, unspecified: Secondary | ICD-10-CM | POA: Diagnosis present

## 2023-12-11 DIAGNOSIS — R7989 Other specified abnormal findings of blood chemistry: Secondary | ICD-10-CM

## 2023-12-11 DIAGNOSIS — J9801 Acute bronchospasm: Secondary | ICD-10-CM | POA: Diagnosis present

## 2023-12-11 DIAGNOSIS — G4733 Obstructive sleep apnea (adult) (pediatric): Secondary | ICD-10-CM | POA: Diagnosis present

## 2023-12-11 DIAGNOSIS — Z79899 Other long term (current) drug therapy: Secondary | ICD-10-CM

## 2023-12-11 DIAGNOSIS — Z0389 Encounter for observation for other suspected diseases and conditions ruled out: Secondary | ICD-10-CM | POA: Diagnosis not present

## 2023-12-11 DIAGNOSIS — E875 Hyperkalemia: Secondary | ICD-10-CM | POA: Diagnosis not present

## 2023-12-11 DIAGNOSIS — N400 Enlarged prostate without lower urinary tract symptoms: Secondary | ICD-10-CM | POA: Diagnosis present

## 2023-12-11 DIAGNOSIS — F03A18 Unspecified dementia, mild, with other behavioral disturbance: Secondary | ICD-10-CM | POA: Diagnosis not present

## 2023-12-11 DIAGNOSIS — B9620 Unspecified Escherichia coli [E. coli] as the cause of diseases classified elsewhere: Secondary | ICD-10-CM | POA: Diagnosis present

## 2023-12-11 DIAGNOSIS — F1010 Alcohol abuse, uncomplicated: Secondary | ICD-10-CM | POA: Diagnosis present

## 2023-12-11 DIAGNOSIS — E871 Hypo-osmolality and hyponatremia: Secondary | ICD-10-CM | POA: Diagnosis not present

## 2023-12-11 DIAGNOSIS — R0609 Other forms of dyspnea: Secondary | ICD-10-CM | POA: Diagnosis not present

## 2023-12-11 DIAGNOSIS — D638 Anemia in other chronic diseases classified elsewhere: Secondary | ICD-10-CM | POA: Diagnosis present

## 2023-12-11 DIAGNOSIS — D72829 Elevated white blood cell count, unspecified: Secondary | ICD-10-CM

## 2023-12-11 DIAGNOSIS — N189 Chronic kidney disease, unspecified: Secondary | ICD-10-CM | POA: Diagnosis not present

## 2023-12-11 DIAGNOSIS — G252 Other specified forms of tremor: Secondary | ICD-10-CM | POA: Diagnosis present

## 2023-12-11 DIAGNOSIS — B377 Candidal sepsis: Secondary | ICD-10-CM | POA: Diagnosis not present

## 2023-12-11 DIAGNOSIS — I420 Dilated cardiomyopathy: Secondary | ICD-10-CM | POA: Diagnosis present

## 2023-12-11 DIAGNOSIS — F419 Anxiety disorder, unspecified: Secondary | ICD-10-CM | POA: Diagnosis not present

## 2023-12-11 DIAGNOSIS — K5732 Diverticulitis of large intestine without perforation or abscess without bleeding: Secondary | ICD-10-CM | POA: Diagnosis present

## 2023-12-11 DIAGNOSIS — R0989 Other specified symptoms and signs involving the circulatory and respiratory systems: Secondary | ICD-10-CM | POA: Diagnosis not present

## 2023-12-11 DIAGNOSIS — Z6832 Body mass index (BMI) 32.0-32.9, adult: Secondary | ICD-10-CM | POA: Diagnosis not present

## 2023-12-11 DIAGNOSIS — F0390 Unspecified dementia without behavioral disturbance: Secondary | ICD-10-CM | POA: Diagnosis present

## 2023-12-11 DIAGNOSIS — N136 Pyonephrosis: Secondary | ICD-10-CM | POA: Diagnosis present

## 2023-12-11 DIAGNOSIS — R338 Other retention of urine: Secondary | ICD-10-CM | POA: Diagnosis present

## 2023-12-11 DIAGNOSIS — D631 Anemia in chronic kidney disease: Secondary | ICD-10-CM | POA: Diagnosis present

## 2023-12-11 DIAGNOSIS — R319 Hematuria, unspecified: Secondary | ICD-10-CM | POA: Diagnosis not present

## 2023-12-11 DIAGNOSIS — A4151 Sepsis due to Escherichia coli [E. coli]: Principal | ICD-10-CM | POA: Diagnosis present

## 2023-12-11 DIAGNOSIS — Z66 Do not resuscitate: Secondary | ICD-10-CM | POA: Diagnosis not present

## 2023-12-11 DIAGNOSIS — Z8711 Personal history of peptic ulcer disease: Secondary | ICD-10-CM

## 2023-12-11 DIAGNOSIS — Z960 Presence of urogenital implants: Secondary | ICD-10-CM | POA: Diagnosis present

## 2023-12-11 DIAGNOSIS — M129 Arthropathy, unspecified: Secondary | ICD-10-CM | POA: Diagnosis not present

## 2023-12-11 DIAGNOSIS — I5043 Acute on chronic combined systolic (congestive) and diastolic (congestive) heart failure: Secondary | ICD-10-CM | POA: Diagnosis not present

## 2023-12-11 DIAGNOSIS — N35819 Other urethral stricture, male, unspecified site: Secondary | ICD-10-CM | POA: Diagnosis present

## 2023-12-11 DIAGNOSIS — N138 Other obstructive and reflux uropathy: Secondary | ICD-10-CM | POA: Diagnosis present

## 2023-12-11 DIAGNOSIS — R0602 Shortness of breath: Secondary | ICD-10-CM | POA: Diagnosis not present

## 2023-12-11 DIAGNOSIS — R000 Tachycardia, unspecified: Secondary | ICD-10-CM | POA: Diagnosis not present

## 2023-12-11 DIAGNOSIS — R918 Other nonspecific abnormal finding of lung field: Secondary | ICD-10-CM | POA: Diagnosis not present

## 2023-12-11 DIAGNOSIS — R0603 Acute respiratory distress: Secondary | ICD-10-CM

## 2023-12-11 DIAGNOSIS — Z85828 Personal history of other malignant neoplasm of skin: Secondary | ICD-10-CM

## 2023-12-11 DIAGNOSIS — I77810 Thoracic aortic ectasia: Secondary | ICD-10-CM | POA: Diagnosis present

## 2023-12-11 DIAGNOSIS — Z604 Social exclusion and rejection: Secondary | ICD-10-CM | POA: Diagnosis present

## 2023-12-11 DIAGNOSIS — G4700 Insomnia, unspecified: Secondary | ICD-10-CM | POA: Diagnosis present

## 2023-12-11 DIAGNOSIS — I340 Nonrheumatic mitral (valve) insufficiency: Secondary | ICD-10-CM | POA: Diagnosis present

## 2023-12-11 DIAGNOSIS — N179 Acute kidney failure, unspecified: Secondary | ICD-10-CM | POA: Diagnosis not present

## 2023-12-11 DIAGNOSIS — F3130 Bipolar disorder, current episode depressed, mild or moderate severity, unspecified: Secondary | ICD-10-CM | POA: Diagnosis present

## 2023-12-11 DIAGNOSIS — R7881 Bacteremia: Secondary | ICD-10-CM | POA: Diagnosis not present

## 2023-12-11 DIAGNOSIS — R0902 Hypoxemia: Secondary | ICD-10-CM | POA: Diagnosis present

## 2023-12-11 DIAGNOSIS — B379 Candidiasis, unspecified: Secondary | ICD-10-CM | POA: Diagnosis not present

## 2023-12-11 DIAGNOSIS — R509 Fever, unspecified: Secondary | ICD-10-CM | POA: Diagnosis not present

## 2023-12-11 DIAGNOSIS — Z8249 Family history of ischemic heart disease and other diseases of the circulatory system: Secondary | ICD-10-CM

## 2023-12-11 DIAGNOSIS — E66811 Obesity, class 1: Secondary | ICD-10-CM | POA: Diagnosis present

## 2023-12-11 DIAGNOSIS — N1831 Chronic kidney disease, stage 3a: Secondary | ICD-10-CM | POA: Diagnosis not present

## 2023-12-11 DIAGNOSIS — E7800 Pure hypercholesterolemia, unspecified: Secondary | ICD-10-CM

## 2023-12-11 DIAGNOSIS — Z888 Allergy status to other drugs, medicaments and biological substances status: Secondary | ICD-10-CM

## 2023-12-11 HISTORY — DX: COVID-19: U07.1

## 2023-12-11 HISTORY — DX: Systemic inflammatory response syndrome (sirs) of non-infectious origin without acute organ dysfunction: R65.10

## 2023-12-11 HISTORY — DX: Elevated white blood cell count, unspecified: D72.829

## 2023-12-11 LAB — CBC WITH DIFFERENTIAL/PLATELET
Abs Immature Granulocytes: 0.08 K/uL — ABNORMAL HIGH (ref 0.00–0.07)
Basophils Absolute: 0.1 K/uL (ref 0.0–0.1)
Basophils Relative: 1 %
Eosinophils Absolute: 0.1 K/uL (ref 0.0–0.5)
Eosinophils Relative: 1 %
HCT: 26.7 % — ABNORMAL LOW (ref 39.0–52.0)
Hemoglobin: 8.2 g/dL — ABNORMAL LOW (ref 13.0–17.0)
Immature Granulocytes: 1 %
Lymphocytes Relative: 7 %
Lymphs Abs: 0.8 K/uL (ref 0.7–4.0)
MCH: 28.8 pg (ref 26.0–34.0)
MCHC: 30.7 g/dL (ref 30.0–36.0)
MCV: 93.7 fL (ref 80.0–100.0)
Monocytes Absolute: 1.6 K/uL — ABNORMAL HIGH (ref 0.1–1.0)
Monocytes Relative: 15 %
Neutro Abs: 8.2 K/uL — ABNORMAL HIGH (ref 1.7–7.7)
Neutrophils Relative %: 75 %
Platelets: 211 K/uL (ref 150–400)
RBC: 2.85 MIL/uL — ABNORMAL LOW (ref 4.22–5.81)
RDW: 14.7 % (ref 11.5–15.5)
WBC: 10.8 K/uL — ABNORMAL HIGH (ref 4.0–10.5)
nRBC: 0 % (ref 0.0–0.2)

## 2023-12-11 LAB — I-STAT CHEM 8, ED
BUN: 27 mg/dL — ABNORMAL HIGH (ref 8–23)
Calcium, Ion: 1.12 mmol/L — ABNORMAL LOW (ref 1.15–1.40)
Chloride: 102 mmol/L (ref 98–111)
Creatinine, Ser: 2.5 mg/dL — ABNORMAL HIGH (ref 0.61–1.24)
Glucose, Bld: 111 mg/dL — ABNORMAL HIGH (ref 70–99)
HCT: 25 % — ABNORMAL LOW (ref 39.0–52.0)
Hemoglobin: 8.5 g/dL — ABNORMAL LOW (ref 13.0–17.0)
Potassium: 5.9 mmol/L — ABNORMAL HIGH (ref 3.5–5.1)
Sodium: 134 mmol/L — ABNORMAL LOW (ref 135–145)
TCO2: 22 mmol/L (ref 22–32)

## 2023-12-11 LAB — COMPREHENSIVE METABOLIC PANEL WITH GFR
ALT: 9 U/L (ref 0–44)
AST: 15 U/L (ref 15–41)
Albumin: 2.9 g/dL — ABNORMAL LOW (ref 3.5–5.0)
Alkaline Phosphatase: 59 U/L (ref 38–126)
Anion gap: 13 (ref 5–15)
BUN: 25 mg/dL — ABNORMAL HIGH (ref 8–23)
CO2: 21 mmol/L — ABNORMAL LOW (ref 22–32)
Calcium: 8.5 mg/dL — ABNORMAL LOW (ref 8.9–10.3)
Chloride: 102 mmol/L (ref 98–111)
Creatinine, Ser: 2.4 mg/dL — ABNORMAL HIGH (ref 0.61–1.24)
GFR, Estimated: 27 mL/min — ABNORMAL LOW (ref >60)
Glucose, Bld: 113 mg/dL — ABNORMAL HIGH (ref 70–99)
Potassium: 5.9 mmol/L — ABNORMAL HIGH (ref 3.5–5.1)
Sodium: 136 mmol/L (ref 135–145)
Total Bilirubin: 0.3 mg/dL (ref 0.0–1.2)
Total Protein: 6.6 g/dL (ref 6.5–8.1)

## 2023-12-11 LAB — I-STAT VENOUS BLOOD GAS, ED
Acid-base deficit: 2 mmol/L (ref 0.0–2.0)
Bicarbonate: 23.2 mmol/L (ref 20.0–28.0)
Calcium, Ion: 1.12 mmol/L — ABNORMAL LOW (ref 1.15–1.40)
HCT: 24 % — ABNORMAL LOW (ref 39.0–52.0)
Hemoglobin: 8.2 g/dL — ABNORMAL LOW (ref 13.0–17.0)
O2 Saturation: 96 %
Potassium: 5.8 mmol/L — ABNORMAL HIGH (ref 3.5–5.1)
Sodium: 135 mmol/L (ref 135–145)
TCO2: 25 mmol/L (ref 22–32)
pCO2, Ven: 41.9 mmHg — ABNORMAL LOW (ref 44–60)
pH, Ven: 7.352 (ref 7.25–7.43)
pO2, Ven: 85 mmHg — ABNORMAL HIGH (ref 32–45)

## 2023-12-11 LAB — RESP PANEL BY RT-PCR (RSV, FLU A&B, COVID)  RVPGX2
Influenza A by PCR: NEGATIVE
Influenza B by PCR: NEGATIVE
Resp Syncytial Virus by PCR: NEGATIVE
SARS Coronavirus 2 by RT PCR: POSITIVE — AB

## 2023-12-11 LAB — TROPONIN I (HIGH SENSITIVITY)
Troponin I (High Sensitivity): 33 ng/L — ABNORMAL HIGH (ref <18)
Troponin I (High Sensitivity): 31 ng/L — ABNORMAL HIGH (ref <18)

## 2023-12-11 LAB — PROTIME-INR
INR: 1.2 (ref 0.8–1.2)
Prothrombin Time: 15.7 s — ABNORMAL HIGH (ref 11.4–15.2)

## 2023-12-11 LAB — HEMOGLOBIN AND HEMATOCRIT, BLOOD
HCT: 28.5 % — ABNORMAL LOW (ref 39.0–52.0)
Hemoglobin: 8.6 g/dL — ABNORMAL LOW (ref 13.0–17.0)

## 2023-12-11 LAB — TYPE AND SCREEN
ABO/RH(D): O POS
Antibody Screen: NEGATIVE

## 2023-12-11 LAB — I-STAT CG4 LACTIC ACID, ED: Lactic Acid, Venous: 1.2 mmol/L (ref 0.5–1.9)

## 2023-12-11 LAB — BRAIN NATRIURETIC PEPTIDE: B Natriuretic Peptide: 455.8 pg/mL — ABNORMAL HIGH (ref 0.0–100.0)

## 2023-12-11 MED ORDER — SODIUM CHLORIDE 0.9 % IV SOLN
200.0000 mg | Freq: Once | INTRAVENOUS | Status: AC
  Administered 2023-12-11: 200 mg via INTRAVENOUS
  Filled 2023-12-11: qty 40

## 2023-12-11 MED ORDER — FOLIC ACID 1 MG PO TABS
1.0000 mg | ORAL_TABLET | Freq: Every day | ORAL | Status: DC
  Administered 2023-12-12 – 2023-12-19 (×8): 1 mg via ORAL
  Filled 2023-12-11 (×8): qty 1

## 2023-12-11 MED ORDER — METRONIDAZOLE 500 MG/100ML IV SOLN
500.0000 mg | Freq: Once | INTRAVENOUS | Status: DC
  Filled 2023-12-11: qty 100

## 2023-12-11 MED ORDER — SODIUM BICARBONATE 8.4 % IV SOLN
50.0000 meq | Freq: Once | INTRAVENOUS | Status: AC
  Administered 2023-12-11: 50 meq via INTRAVENOUS
  Filled 2023-12-11: qty 50

## 2023-12-11 MED ORDER — SODIUM CHLORIDE 0.9 % IV SOLN
100.0000 mg | Freq: Every day | INTRAVENOUS | Status: AC
  Administered 2023-12-12 – 2023-12-13 (×2): 100 mg via INTRAVENOUS
  Filled 2023-12-11 (×2): qty 20

## 2023-12-11 MED ORDER — GABAPENTIN 100 MG PO CAPS
100.0000 mg | ORAL_CAPSULE | Freq: Three times a day (TID) | ORAL | Status: DC
  Administered 2023-12-11 – 2023-12-19 (×23): 100 mg via ORAL
  Filled 2023-12-11 (×25): qty 1

## 2023-12-11 MED ORDER — VANCOMYCIN HCL 2000 MG/400ML IV SOLN
2000.0000 mg | Freq: Once | INTRAVENOUS | Status: DC
  Filled 2023-12-11: qty 400

## 2023-12-11 MED ORDER — TAMSULOSIN HCL 0.4 MG PO CAPS
0.4000 mg | ORAL_CAPSULE | Freq: Every day | ORAL | Status: DC
  Administered 2023-12-12 – 2023-12-19 (×8): 0.4 mg via ORAL
  Filled 2023-12-11 (×8): qty 1

## 2023-12-11 MED ORDER — MELATONIN 3 MG PO TABS
3.0000 mg | ORAL_TABLET | Freq: Every day | ORAL | Status: DC
  Administered 2023-12-12 – 2023-12-14 (×3): 3 mg via ORAL
  Filled 2023-12-11 (×5): qty 1

## 2023-12-11 MED ORDER — LEVALBUTEROL HCL 0.63 MG/3ML IN NEBU
0.6300 mg | INHALATION_SOLUTION | Freq: Four times a day (QID) | RESPIRATORY_TRACT | Status: DC | PRN
  Administered 2023-12-11 – 2023-12-18 (×4): 0.63 mg via RESPIRATORY_TRACT
  Filled 2023-12-11 (×4): qty 3

## 2023-12-11 MED ORDER — TRAZODONE HCL 50 MG PO TABS
100.0000 mg | ORAL_TABLET | Freq: Every day | ORAL | Status: DC
  Administered 2023-12-12 – 2023-12-18 (×7): 100 mg via ORAL
  Filled 2023-12-11 (×9): qty 2

## 2023-12-11 MED ORDER — CARVEDILOL 12.5 MG PO TABS
12.5000 mg | ORAL_TABLET | Freq: Two times a day (BID) | ORAL | Status: DC
  Administered 2023-12-12 – 2023-12-14 (×4): 12.5 mg via ORAL
  Filled 2023-12-11 (×6): qty 1

## 2023-12-11 MED ORDER — VANCOMYCIN HCL IN DEXTROSE 1-5 GM/200ML-% IV SOLN: 1000.0000 mg | Freq: Once | INTRAVENOUS | Status: DC

## 2023-12-11 MED ORDER — VENLAFAXINE HCL ER 75 MG PO CP24
150.0000 mg | ORAL_CAPSULE | Freq: Every day | ORAL | Status: DC
  Administered 2023-12-12 – 2023-12-19 (×8): 150 mg via ORAL
  Filled 2023-12-11 (×8): qty 2

## 2023-12-11 MED ORDER — METHYLPREDNISOLONE SODIUM SUCC 125 MG IJ SOLR
0.5000 mg/kg | Freq: Two times a day (BID) | INTRAMUSCULAR | Status: DC
  Administered 2023-12-11 – 2023-12-12 (×2): 53.75 mg via INTRAVENOUS
  Filled 2023-12-11 (×2): qty 2

## 2023-12-11 MED ORDER — PREDNISONE 5 MG PO TABS: 50.0000 mg | ORAL_TABLET | Freq: Every day | ORAL | Status: DC

## 2023-12-11 MED ORDER — ALBUTEROL SULFATE (2.5 MG/3ML) 0.083% IN NEBU
2.5000 mg | INHALATION_SOLUTION | Freq: Once | RESPIRATORY_TRACT | Status: DC
  Filled 2023-12-11: qty 3

## 2023-12-11 MED ORDER — PRAVASTATIN SODIUM 10 MG PO TABS
20.0000 mg | ORAL_TABLET | Freq: Every day | ORAL | Status: DC
  Administered 2023-12-12 – 2023-12-18 (×7): 20 mg via ORAL
  Filled 2023-12-11 (×10): qty 2

## 2023-12-11 MED ORDER — SODIUM ZIRCONIUM CYCLOSILICATE 5 G PO PACK
5.0000 g | PACK | Freq: Once | ORAL | Status: DC
  Filled 2023-12-11: qty 1

## 2023-12-11 MED ORDER — GUAIFENESIN ER 600 MG PO TB12
600.0000 mg | ORAL_TABLET | Freq: Two times a day (BID) | ORAL | Status: DC
  Administered 2023-12-12 – 2023-12-19 (×15): 600 mg via ORAL
  Filled 2023-12-11 (×17): qty 1

## 2023-12-11 MED ORDER — ASPIRIN 81 MG PO CHEW: 324.0000 mg | CHEWABLE_TABLET | Freq: Once | ORAL | Status: DC

## 2023-12-11 MED ORDER — CALCIUM GLUCONATE-NACL 1-0.675 GM/50ML-% IV SOLN
1.0000 g | Freq: Once | INTRAVENOUS | Status: AC
  Administered 2023-12-11: 1000 mg via INTRAVENOUS
  Filled 2023-12-11: qty 50

## 2023-12-11 MED ORDER — FINASTERIDE 5 MG PO TABS
5.0000 mg | ORAL_TABLET | Freq: Every day | ORAL | Status: DC
  Administered 2023-12-12 – 2023-12-19 (×8): 5 mg via ORAL
  Filled 2023-12-11 (×8): qty 1

## 2023-12-11 MED ORDER — FLUCONAZOLE 100 MG PO TABS
400.0000 mg | ORAL_TABLET | Freq: Every day | ORAL | Status: DC
  Administered 2023-12-12 – 2023-12-17 (×6): 400 mg via ORAL
  Filled 2023-12-11 (×6): qty 4

## 2023-12-11 MED ORDER — SODIUM CHLORIDE 0.9 % IV SOLN: INTRAVENOUS | Status: DC

## 2023-12-11 MED ORDER — SODIUM CHLORIDE 0.9% FLUSH
3.0000 mL | Freq: Two times a day (BID) | INTRAVENOUS | Status: DC
  Administered 2023-12-11 – 2023-12-19 (×16): 3 mL via INTRAVENOUS

## 2023-12-11 MED ORDER — ACETAMINOPHEN 650 MG RE SUPP: 650.0000 mg | Freq: Four times a day (QID) | RECTAL | Status: DC | PRN

## 2023-12-11 MED ORDER — MIDODRINE HCL 5 MG PO TABS
5.0000 mg | ORAL_TABLET | Freq: Three times a day (TID) | ORAL | Status: DC
  Administered 2023-12-11 – 2023-12-14 (×7): 5 mg via ORAL
  Filled 2023-12-11 (×7): qty 1

## 2023-12-11 MED ORDER — MIRTAZAPINE 15 MG PO TABS
30.0000 mg | ORAL_TABLET | Freq: Every day | ORAL | Status: DC
  Administered 2023-12-12 – 2023-12-18 (×7): 30 mg via ORAL
  Filled 2023-12-11 (×9): qty 2

## 2023-12-11 MED ORDER — ARIPIPRAZOLE 2 MG PO TABS
2.0000 mg | ORAL_TABLET | Freq: Every morning | ORAL | Status: DC
  Administered 2023-12-12 – 2023-12-19 (×8): 2 mg via ORAL
  Filled 2023-12-11 (×9): qty 1

## 2023-12-11 MED ORDER — ACETAMINOPHEN 325 MG PO TABS
650.0000 mg | ORAL_TABLET | Freq: Four times a day (QID) | ORAL | Status: DC | PRN
  Administered 2023-12-13: 650 mg via ORAL
  Filled 2023-12-11: qty 2

## 2023-12-11 MED ORDER — BUSPIRONE HCL 5 MG PO TABS
10.0000 mg | ORAL_TABLET | Freq: Three times a day (TID) | ORAL | Status: DC
  Administered 2023-12-11 – 2023-12-19 (×23): 10 mg via ORAL
  Filled 2023-12-11 (×21): qty 2
  Filled 2023-12-11: qty 1
  Filled 2023-12-11 (×4): qty 2

## 2023-12-11 MED ORDER — SODIUM CHLORIDE 0.9 % IV SOLN
2.0000 g | Freq: Once | INTRAVENOUS | Status: AC
  Administered 2023-12-11: 2 g via INTRAVENOUS
  Filled 2023-12-11: qty 12.5

## 2023-12-11 MED ORDER — LACTATED RINGERS IV BOLUS (SEPSIS)
1000.0000 mL | Freq: Once | INTRAVENOUS | Status: AC
  Administered 2023-12-11: 1000 mL via INTRAVENOUS

## 2023-12-11 MED ORDER — PRIMIDONE 50 MG PO TABS
50.0000 mg | ORAL_TABLET | Freq: Three times a day (TID) | ORAL | Status: DC
  Administered 2023-12-12 – 2023-12-19 (×23): 50 mg via ORAL
  Filled 2023-12-11 (×28): qty 1

## 2023-12-11 MED ORDER — CARVEDILOL 12.5 MG PO TABS: 12.5000 mg | ORAL_TABLET | Freq: Two times a day (BID) | ORAL | Status: DC

## 2023-12-11 NOTE — Telephone Encounter (Signed)
  FYI Only or Action Required?: FYI only for provider.  Patient was last seen in primary care on 11/20/2023 by [name], MD.  Called Nurse Triage reporting Heart Problem.  Symptoms began today.  Interventions attempted: Nothing.  Symptoms are: rapidly worsening.  Triage Disposition: No disposition on file.  Patient/caregiver understands and will follow disposition?: Yes  TO ER   Initial Assessment Questions 1. DESCRIPTION: Please describe your heart rate or heartbeat that you are having (e.g., fast/slow, regular/irregular, skipped or extra beats, palpitations)  Heart rate 133 2. ONSET: When did it start? (e.g., minutes, hours, days) 3. DURATION: How long does it last (e.g., seconds, minutes, hours) about 20 minutes. 4. PATTERN Does it come and go, or has it been constant since it started? Does it get worse with exertion? Are you feeling it now? TO ER 5. TAP: Using your hand, can you tap out what you are feeling on a chair or table in front of you, so that I can hear? Note: Not all patients can do this. NA 6. HEART RATE: Can you tell me your heart rate? How many beats in 15 seconds? Note: Not all patients can do this. 133 7. RECURRENT SYMPTOM: Have you ever had this before? If Yes, ask: When was the last time? and What happened that time? 8. CAUSE: What do you think is causing the palpitations? NA 9. CARDIAC HISTORY: Do you have any history of heart disease? (e.g., heart attack, angina, bypass surgery, angioplasty, arrhythmia) NA TO ER 10. OTHER SYMPTOMS: Do you have any other symptoms? (e.g., dizziness, chest pain, sweating, difficulty breathing) RESPIRATIONS 36 PER PHYSICAL THERAPIST; INSTRUCTED TO CALL 911 11. PREGNANCY: Is there any chance you are pregnant? When was your last menstrual period?   Copied from CRM [id number]. Topic: Clinical - Red Word Triage >> Dec 11, 2023 10:24 AM Chiquita SQUIBB wrote: Red Word that prompted transfer to Nurse  Triage: Patient just got out of the hospital and the physical therapist came for their visit and the patients vitals are all high temp of 103, heart rate of 133 and BP of 158/98. Patient is very short of breath as well.

## 2023-12-11 NOTE — ED Provider Notes (Signed)
  EMERGENCY DEPARTMENT AT [facility] Provider Note   CSN: [id number] Arrival date & time: 12/11/23  1146     Patient presents with: No chief complaint on file.   Calvin Escobar is a 73 y.o. male.   73 year old male presents for evaluation of possible sepsis.  Was sent in by his home health nurse.  Patient states he had a cough but otherwise feeling well.  Nursing placed him on 2 L nasal cannula as he has been tachypneic and tachycardic.  He has no other complaints besides a cough.        Prior to Admission medications   Medication Sig Start Date End Date Taking? Authorizing Provider  ARIPiprazole  (ABILIFY ) 2 MG tablet Take 1 tablet (2 mg total) by mouth in the morning. 08/22/23   [name], MD  busPIRone  (BUSPAR ) 10 MG tablet Take 1 tablet (10 mg total) by mouth 3 (three) times daily. 09/16/23   [name], MD  carvedilol  (COREG ) 12.5 MG tablet Take 12.5 mg by mouth 2 (two) times daily. Patient not taking: Reported on 12/04/2023 10/28/23   [provider]  finasteride  (PROSCAR ) 5 MG tablet Take 5 mg by mouth daily. 11/06/23   [provider]  fluconazole  (DIFLUCAN ) 200 MG tablet Take 2 tablets (400 mg total) by mouth daily for 14 days. 12/07/23 12/21/23  Odell Celinda Balo, MD  folic acid  (FOLVITE ) 1 MG tablet Take 1 mg by mouth daily. 10/21/23   [provider]  gabapentin  (NEURONTIN ) 100 MG capsule Take 1 capsule (100 mg total) by mouth 3 (three) times daily. 09/16/23   [name], MD  losartan  (COZAAR ) 50 MG tablet Take 50 mg by mouth daily. Patient not taking: Reported on 12/04/2023 10/28/23   [provider]  melatonin 3 MG TABS tablet Take 1 tablet (3 mg total) by mouth at bedtime as needed (insomnia). Patient taking differently: Take 3 mg by mouth at bedtime. 07/19/23   Rai, Nydia POUR, MD  midodrine  (PROAMATINE ) 5 MG tablet Take 1 tablet (5 mg total) by mouth 3 (three) times daily with meals. Patient not  taking: Reported on 12/04/2023 06/11/23   Krishnan, Gokul, MD  mirtazapine  (REMERON ) 30 MG tablet Take 1 tablet (30 mg total) by mouth at bedtime. 09/16/23   [name], MD  pravastatin  (PRAVACHOL ) 20 MG tablet Take 1 tablet (20 mg total) by mouth at bedtime. 12/19/22   [name], MD  primidone  (MYSOLINE ) 50 MG tablet Take 1 tablet (50 mg total) by mouth 2 (two) times daily. Patient taking differently: Take 50 mg by mouth 3 (three) times daily. 06/11/23   Krishnan, Gokul, MD  tamsulosin  (FLOMAX ) 0.4 MG CAPS capsule TAKE 1 CAPSULE BY MOUTH EVERY DAY 07/23/23   [name], MD  traZODone  (DESYREL ) 100 MG tablet Take 1 tablet (100 mg total) by mouth at bedtime. 07/19/23   Rai, Nydia POUR, MD  venlafaxine  XR (EFFEXOR -XR) 150 MG 24 hr capsule Take 150 mg by mouth daily. 10/04/23   [provider]    Allergies: Albuterol     Review of Systems  Constitutional:  Negative for chills and fever.  HENT:  Negative for ear pain and sore throat.   Eyes:  Negative for pain and visual disturbance.  Respiratory:  Positive for cough. Negative for shortness of breath.   Cardiovascular:  Negative for chest pain and palpitations.  Gastrointestinal:  Negative for abdominal pain and vomiting.  Genitourinary:  Negative for dysuria and hematuria.  Musculoskeletal:  Negative for arthralgias and back pain.  Skin:  Negative for color change and rash.  Neurological:  Negative for seizures and syncope.  All other systems reviewed and are negative.   Updated Vital Signs BP 119/75 (BP Location: Right Arm)   Pulse (!) 112   Temp 99.6 F (37.6 C)   Resp (!) 29   SpO2 97%   Physical Exam Vitals and nursing note reviewed.  Constitutional:      General: He is not in acute distress.    Appearance: Normal appearance. He is well-developed. He is not ill-appearing.  HENT:     Head: Normocephalic and atraumatic.  Eyes:     Conjunctiva/sclera: Conjunctivae normal.  Cardiovascular:     Rate and Rhythm:  Regular rhythm. Tachycardia present.     Heart sounds: No murmur heard. Pulmonary:     Effort: Respiratory distress present.     Breath sounds: No stridor.     Comments: Diminished breath sounds bilaterally, tachypnea Abdominal:     Palpations: Abdomen is soft.     Tenderness: There is no abdominal tenderness.  Musculoskeletal:        General: No swelling.     Cervical back: Neck supple.  Skin:    General: Skin is warm and dry.     Capillary Refill: Capillary refill takes less than 2 seconds.  Neurological:     General: No focal deficit present.     Mental Status: He is alert.  Psychiatric:        Mood and Affect: Mood normal.     (all labs ordered are listed, but only abnormal results are displayed) Labs Reviewed  RESP PANEL BY RT-PCR (RSV, FLU A&B, COVID)  RVPGX2 - Abnormal; Notable for the following components:      Result Value   SARS Coronavirus 2 by RT PCR POSITIVE (*)    All other components within normal limits  COMPREHENSIVE METABOLIC PANEL WITH GFR - Abnormal; Notable for the following components:   Potassium 5.9 (*)    CO2 21 (*)    Glucose, Bld 113 (*)    BUN 25 (*)    Creatinine, Ser 2.40 (*)    Calcium  8.5 (*)    Albumin  2.9 (*)    GFR, Estimated 27 (*)    All other components within normal limits  CBC WITH DIFFERENTIAL/PLATELET - Abnormal; Notable for the following components:   WBC 10.8 (*)    RBC 2.85 (*)    Hemoglobin 8.2 (*)    HCT 26.7 (*)    Neutro Abs 8.2 (*)    Monocytes Absolute 1.6 (*)    Abs Immature Granulocytes 0.08 (*)    All other components within normal limits  PROTIME-INR - Abnormal; Notable for the following components:   Prothrombin Time 15.7 (*)    All other components within normal limits  BRAIN NATRIURETIC PEPTIDE - Abnormal; Notable for the following components:   B Natriuretic Peptide 455.8 (*)    All other components within normal limits  I-STAT CHEM 8, ED - Abnormal; Notable for the following components:   Sodium 134 (*)     Potassium 5.9 (*)    BUN 27 (*)    Creatinine, Ser 2.50 (*)    Glucose, Bld 111 (*)    Calcium , Ion 1.12 (*)    Hemoglobin 8.5 (*)    HCT 25.0 (*)    All other components within normal limits  I-STAT VENOUS BLOOD GAS, ED - Abnormal; Notable for the following  components:   pCO2, Ven 41.9 (*)    pO2, Ven 85 (*)    Potassium 5.8 (*)    Calcium , Ion 1.12 (*)    HCT 24.0 (*)    Hemoglobin 8.2 (*)    All other components within normal limits  TROPONIN I (HIGH SENSITIVITY) - Abnormal; Notable for the following components:   Troponin I (High Sensitivity) 33 (*)    All other components within normal limits  CULTURE, BLOOD (ROUTINE X 2)  CULTURE, BLOOD (ROUTINE X 2)  URINALYSIS, W/ REFLEX TO CULTURE (INFECTION SUSPECTED)  I-STAT CG4 LACTIC ACID, ED  I-STAT CG4 LACTIC ACID, ED  TROPONIN I (HIGH SENSITIVITY)    EKG: EKG Interpretation Date/Time:  Wednesday December 11 2023 13:27:43 EDT Ventricular Rate:  100 PR Interval:  166 QRS Duration:  132 QT Interval:  356 QTC Calculation: 460 R Axis:   -36  Text Interpretation: Sinus tachycardia Left bundle branch block No STEMI No acute change when compared with prior EKG from 10/17/2023 Confirmed by Gennaro Bouchard ([id number]) on 12/11/2023 1:33:28 PM  Radiology: [facility] Chest Port 1 View Result Date: 12/11/2023 CLINICAL DATA:  Questionable sepsis - evaluate for abnormality. Tachypnea. EXAM: PORTABLE CHEST 1 VIEW COMPARISON:  07/12/2023. FINDINGS: Stable calcified granuloma overlying the right lower lung zone, laterally. Bilateral lung fields are clear. No acute consolidation or lung collapse. Bilateral costophrenic angles are clear. Stable cardio-mediastinal silhouette. No acute osseous abnormalities. The soft tissues are within normal limits. IMPRESSION: No active disease. Electronically Signed   By: Ree Molt M.D.   On: 12/11/2023 13:08     Procedures   Medications Ordered in the ED  sodium zirconium cyclosilicate  (LOKELMA ) packet 5 g (has no  administration in time range)  calcium  gluconate 1 g/ 50 mL sodium chloride  IVPB (has no administration in time range)  sodium bicarbonate  injection 50 mEq (has no administration in time range)  albuterol  (PROVENTIL ) (2.5 MG/3ML) 0.083% nebulizer solution 2.5 mg (has no administration in time range)  aspirin  chewable tablet 324 mg (has no administration in time range)  lactated ringers  bolus 1,000 mL (1,000 mLs Intravenous New Bag/Given 12/11/23 1324)  ceFEPIme  (MAXIPIME ) 2 g in sodium chloride  0.9 % 100 mL IVPB (2 g Intravenous New Bag/Given 12/11/23 1324)                                    Medical Decision Making Medical Decision Making Nursing notes are reviewed. Differential diagnosis for this patient would include but not limited to: Sepsis, pneumonia, UTI, AKI, ACS, other  Cardiac monitor interpretation: Sinus tachycardia, no ectopy  Emergency Department Course:  Vital signs and pulse oximetry are reviewed, evaluated by myself and found to be within normal limits prior to final disposition. Findings of laboratory testing and medical imaging are discussed with patient and family that is available. Patient agrees with the medical care plan as follows:  Patient here for tachypnea and tachycardia.  Sepsis protocol initiated but I only given 1 L of IV fluid bolus because he has a previous echocardiogram that shows diastolic dysfunction.  He does not have leukocytosis but on review of his labs does have a positive COVID as well as a new AKI and hyperkalemia.  He has a history of urinary retention is using a Foley.  We will flush it and make sure that is working.  Was treated with multiple medications for hyperkalemia and was started on broad-spectrum antibiotics initially  for concerns for sepsis.  He is on 2 L nasal cannula for comfort.  Troponin possibly elevated from demand versus worsening creatinine.  Discussed patient's case with hospitalist and patient be admitted for further workup and  management.  Patient is agreeable with the plan.  Critical care time:  The high probability of sudden, clincially significant deterioration of the pt's condition required the highest level of my preparedness to intervene urgently.  42 minutes of critical care time spent managing the pt's case which includes urgent interventions required to prevent body system deterioration or permanent bodily impairment, direct contact evaluating and re-evaluating the pt, treating sxs, reviewing labs and studies, speaking with pt's family, and consults. Time also includes documentation of the above. This time excludes time spent on any separately reported billable procedures and teaching.    Problems Addressed: AKI (acute kidney injury) (HCC): acute illness or injury that poses a threat to life or bodily functions COVID-19: acute illness or injury Elevated troponin: undiagnosed new problem with uncertain prognosis Hyperkalemia: acute illness or injury that poses a threat to life or bodily functions SIRS (systemic inflammatory response syndrome) (HCC): undiagnosed new problem with uncertain prognosis Tachypnea: undiagnosed new problem with uncertain prognosis  Amount and/or Complexity of Data Reviewed Independent Historian: EMS    Details: EMS help to provide history as patient is poor historian.  States that home health nurse thought patient was tachypneic and tachycardic and was concern for sepsis External Data Reviewed: notes.    Details: Prior echocardiogram reviewed and patient has a EF of 50 to 55% with diastolic dysfunction Labs: ordered. Decision-making details documented in ED Course.    Details: Ordered and reviewed by me and patient is positive for COVID.  Also has a new AKI and hyperkalemia Radiology: ordered and independent interpretation performed. Decision-making details documented in ED Course.    Details: Ordered and interpreted independently of radiology Chest x-ray shows no acute  abnormality in the chest ECG/medicine tests: ordered and independent interpretation performed. Decision-making details documented in ED Course.    Details: EKG ordered and interpreted by me in the absence of cardiology and shows sinus tachycardia but no STEMI no acute change when compared to prior EKG Discussion of management or test interpretation with external provider(s): Hospitalist-I spoke with the hospitalist regarding the patient's case and we will admit the patient for further workup. He recommended checking patient's foley to r/o obstruction or urinary retention   Risk OTC drugs. Prescription drug management. Drug therapy requiring intensive monitoring for toxicity. Decision regarding hospitalization.    Final diagnoses:  AKI (acute kidney injury) (HCC)  Hyperkalemia  COVID-19  SIRS (systemic inflammatory response syndrome) (HCC)  Elevated troponin  Tachypnea    ED Discharge Orders     None          Gennaro Duwaine CROME, DO 12/11/23 1533

## 2023-12-11 NOTE — H&P (Addendum)
 History and Physical    Patient: Calvin Escobar FMW:[id number] DOB: 11-Mar-1951 DOA: 12/11/2023 DOS: the patient was seen and examined on 12/11/2023 PCP: [name], MD  Patient coming from: Home via EMS  Chief Complaint:  Cough  HPI: Calvin Escobar is a 73 y.o. male with medical history significant of HFimpEF, hyperlipidemia, history of colovesical fistula, BPH with urinary retention, anemia of chronic disease, depression, bipolar disorder, and dementia presented due to reports of fever.  Most of history is obtained from his wife over the phone and review of records.  Patient had just recently been hospitalized from 6/30-7/5 presented with hematuria. A Foley catheter was placed by urology prior to arrival; hematuria resolved. Blood cultures on 11/20/23 were positive for Candida (candidemia). He was treated with micafungin , then switched to fluconazole  for 14 days per ID. Follow-up cultures were negative; no ocular involvement was found when evaluated by ophthalmology. CT showed cystitis with some left hydronephrosis. Colonoscopy revealed diverticulosis and severe distal sigmoid narrowing, likely the fistula site.  Ultimately he was discharged home.  He was assessed by his home health aide today and reported to be febrile up to 103 F tachycardic and tachypneic with reports of cough. This morning patient did not eat his breakfast and reported feeling like he needed more sleep.  Yesterday, his wife noticed that he had shallow breathing, but when she asked what was wrong he said nothing.  Patient admits that he has had poor appetite since getting home over the last 2 days.  Denies any other significant complaints at this time.  In the ED patient was noted to be afebrile with tachycardia and tachypnea meeting SIRS criteria.  Labs significant for WBC 10.8, hemoglobin 8.2, potassium 5.9, BUN 25, creatinine 2.5, BNP 455.3,  high-sensitivity troponin 33, and lactic acid 1.2.  Chest x-ray noted no  acute abnormality.  COVID-19 screening was positive.  Patient had been given 1 L of lactated Ringer 's, 1 amp of sodium bicarb, 1 g of calcium  gluconate, Lokelma  5 g p.o.  Review of Systems: As mentioned in the history of present illness. All other systems reviewed and are negative. 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 History of GI bleeding (07/2023). Currently on ferrous sulfate  supplementation.   DIAGNOSTIC ASSESSMENT: ? Primary etiology (70-80% probability): Anemia of chronic disease/inflammation ? Secondary considerations: Iron deficiency component (elevated RD   DCM (dilated cardiomyopathy) (HCC)    nonischemic with normal coronary arteries at cath 06/2019.  EF 35 to 40% on echo 04/2021   Dementia (HCC)    Depression    Diarrhea 05/13/2023   E coli bacteremia 05/14/2023   High anion gap metabolic acidosis 07/13/2023   History of Clostridioides difficile colitis 08/15/2023   Associated with colovesical fistula? 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  Endoscopy esophageal stricture 2016  Septic shock from UTI 06/02/2023   Severe sepsis (HCC) 06/03/2023   SKIN CANCER, HX OF 05/10/2007   Facial left check and forehead and r shoulder  F/w derm   Syncope 05/13/2023   Thrombocytopenia (HCC) 06/02/2023    Lab Results      Component    Value    Date           PLT    301    07/19/2023           PLT    324    07/18/2023           PLT    268    07/16/2023           PLT    207    07/14/2023           PLT    228    07/13/2023             Lab Results      Component    Value    Date           ESRSEDRATE    60 (H)    05/08/2023     No results found for: CRP          Lab Results      Component    Value       Urinary catheter complication (HCC) 06/24/2023   Urinary dribbling 03/14/2022   Urinary incontinence 11/21/2022      Urinary Incontinence: They are wearing disposable diapers daily due to urinary incontinence, primarily nocturnal, and are currently on Tamsulosin  (Flomax ). We will refer them to Urology for further evaluation and potential treatment options and continue Tamsulosin  until they are seen by Urology.     Urinary tract infection without hematuria 06/02/2023   UTI (urinary tract infection) 07/01/2023   Past Surgical History:  Procedure Laterality Date   COLONOSCOPY     COLONOSCOPY N/A 12/05/2023   Procedure: COLONOSCOPY;  Surgeon: Leigh Elspeth SQUIBB, MD;  Location: [facility] ENDOSCOPY;  Service: Gastroenterology;  Laterality: N/A;   RIGHT/LEFT HEART CATH AND CORONARY ANGIOGRAPHY N/A 07/03/2019   Procedure: RIGHT/LEFT HEART CATH AND CORONARY ANGIOGRAPHY;  Surgeon: Cherrie Toribio SAUNDERS, MD;  Location: [facility] INVASIVE CV LAB;  Service: Cardiovascular;  Laterality: N/A;   Social History:  reports that he has never smoked. He has never used smokeless tobacco. He reports that he does not currently use alcohol after a past usage of about 14.0 standard drinks of alcohol per week. He reports that he does not use drugs.  Allergies  Allergen Reactions   Albuterol  Palpitations    Supraventricular Tachycardia     Family History  Problem Relation Age of Onset   Hypertension Mother    Cancer Father    Bone cancer Sister     Prior to Admission medications   Medication Sig Start Date End Date Taking?  Authorizing Provider  ARIPiprazole  (ABILIFY ) 2 MG tablet Take 1 tablet (2 mg total) by mouth in the morning. 08/22/23  Yes [name], MD  busPIRone  (BUSPAR ) 10 MG tablet Take 1 tablet (10 mg total) by mouth 3 (three) times daily. 09/16/23  Yes [name], MD  carvedilol  (COREG ) 12.5 MG tablet Take 12.5 mg by mouth 2 (two) times daily. 10/28/23  Yes [provider]  finasteride  (PROSCAR ) 5 MG tablet Take 5 mg by mouth daily. 11/06/23  Yes [provider]  fluconazole  (DIFLUCAN ) 200 MG tablet Take 2 tablets (400 mg  total) by mouth daily for 14 days. 12/07/23 12/21/23 Yes Odell Celinda Balo, MD  folic acid  (FOLVITE ) 1 MG tablet Take 1 mg by mouth daily. 10/21/23  Yes [provider]  gabapentin  (NEURONTIN ) 100 MG capsule Take 1 capsule (100 mg total) by mouth 3 (three) times daily. 09/16/23  Yes [name], MD  losartan  (COZAAR ) 50 MG tablet Take 50 mg by mouth daily. 10/28/23  Yes [provider]  melatonin 3 MG TABS tablet Take 1 tablet (3 mg total) by mouth at bedtime as needed (insomnia). Patient taking differently: Take 3 mg by mouth at bedtime. 07/19/23  Yes Rai, Ripudeep K, MD  midodrine  (PROAMATINE ) 5 MG tablet Take 1 tablet (5 mg total) by mouth 3 (three) times daily with meals. 06/11/23  Yes Krishnan, Gokul, MD  mirtazapine  (REMERON ) 30 MG tablet Take 1 tablet (30 mg total) by mouth at bedtime. 09/16/23  Yes [name], MD  pravastatin  (PRAVACHOL ) 20 MG tablet Take 1 tablet (20 mg total) by mouth at bedtime. 12/19/22  Yes [name], MD  primidone  (MYSOLINE ) 50 MG tablet Take 1 tablet (50 mg total) by mouth 2 (two) times daily. Patient taking differently: Take 50 mg by mouth 3 (three) times daily. 06/11/23  Yes Krishnan, Gokul, MD  tamsulosin  (FLOMAX ) 0.4 MG CAPS capsule TAKE 1 CAPSULE BY MOUTH EVERY DAY 07/23/23  Yes [name], MD  traZODone  (DESYREL ) 100 MG tablet Take 1 tablet (100 mg total) by mouth at bedtime. 07/19/23  Yes Rai,  Ripudeep K, MD  venlafaxine  XR (EFFEXOR -XR) 150 MG 24 hr capsule Take 150 mg by mouth daily. 10/04/23  Yes [provider]    Physical Exam: Vitals:   12/11/23 1153  BP: 119/75  Pulse: (!) 112  Resp: (!) 29  Temp: 99.6 F (37.6 C)  SpO2: 97%   Constitutional: obese elderly man currently in NAD  Eyes: PERRL, lids and conjunctivae normal ENMT: Mucous membranes are moist.   Neck: normal, supple  Respiratory: clear to auscultation bilaterally, no wheezing, no crackles. Normal respiratory effort. No accessory muscle use.  Cardiovascular: Tachycardic.  No significant extremity edema. 2+ pedal pulses. No carotid bruits.  Abdomen: Protuberant abdomen.  No tenderness, no masses palpated.  Bowel sounds positive.  Musculoskeletal: no clubbing / cyanosis. No joint deformity upper and lower extremities. Good ROM, no contractures. Normal muscle tone.  Skin: no rashes, lesions, ulcers. No induration Neurologic: CN 2-12 grossly intact. Sensation intact, DTR normal. Strength 5/5 in all 4.  Psychiatric: Normal judgment and insight. Alert and oriented x 3. Normal mood.   Data Reviewed:  Reviewed labs, imaging, and pertinent records as documented  Assessment and Plan:  COVID-19 infection Patient presents with reports of fever and feeling unwell.  His chest x-ray did not show any acute abnormality. Found to be positive for COVID-19. - Admit to a telemetry bed - Mucinex  - Continue supportive care  SIRS Leukocytosis Acute.  WBC elevated at 10.8, but also was noted to be tachycardic and tachypneic meeting SIRS criteria.  Lactic acid was reassuring at 1.2.  Thought secondary to above.  Patient had initially been started on empiric antibiotics of cefepime . - Follow-up blood cultures - Check urinalysis with culture  - Check CRP  Acute kidney injury superimposed on chronic kidney disease stage IIIa Creatinine noted to be elevated up to 2.4 with BUN 25 baseline creatinine previously had been  1.8 when discharged from the hospital on 7/3.  Patient had been given 1 L normal  saline IV fluids. - Check CT scan abdomen pelvis - Normal saline IV fluids at 100 mL/h - Recheck kidney function in a.m.  Hyperkalemia Hypocalcemia Acute.  On admission potassium noted to be elevated at 5.9 and calcium  8.5.  Patient has been given Lokelma , and calcium  gluconate 1 g IV. - Held losartan  due to hyperkalemia and AKI - Recheck levels in a.m.  Candidemia During previous hospitalization in patient had been treated with micafungin  and ID have been consulted after blood cultures from 6/18 were positive for Candida.  Evaluated by ophthalmology and noted that he did not have it admission repeat blood cultures were negative.  ID recommended patient to continue on fluconazole  for 14 days after discharge. - Continue fluconazole  to complete 14-day course  Anemia of chronic disease Acute on chronic.  Hemoglobin acutely low at 8.2.  Baseline had been 9 to 10 g/dL. - Type and screen for possible need for blood products - Check stool guaiac - Recheck H&H  BPH with urinary retention Patient has his chronic urinary retention secondary to BPH and urethral stricture recently had Foley catheter placed by urology. - Continue finasteride  and tamsulosin  - Continue Foley catheter and do not remove due to difficult placement  Anxiety/bipolar disorder - Continue Abilify  BuSpar  mirtazapine  and Zanaflex.  Dementia - Delirium precautions  Hyperlipidemia - Continue statin  colovesicular fistula GI was consulted during last hospitalization to perform a colonoscopy in 12/05/2023 that showed diverticulosis with severe luminal narrowing in the distal sigmoid which they suspect is the fistula area.  - Recommend patient to continue outpatient follow-up with general surgery.    DVT prophylaxis: SCDs Advance Care Planning:   Code Status: Do not attempt resuscitation (DNR) PRE-ARREST INTERVENTIONS DESIRED    Consults:  None  Family Communication: Wife updated over the phone  Severity of Illness: The appropriate patient status for this patient is INPATIENT. Inpatient status is judged to be reasonable and necessary in order to provide the required intensity of service to ensure the patient's safety. The patient's presenting symptoms, physical exam findings, and initial radiographic and laboratory data in the context of their chronic comorbidities is felt to place them at high risk for further clinical deterioration. Furthermore, it is not anticipated that the patient will be medically stable for discharge from the hospital within 2 midnights of admission.   * I certify that at the point of admission it is my clinical judgment that the patient will require inpatient hospital care spanning beyond 2 midnights from the point of admission due to high intensity of service, high risk for further deterioration and high frequency of surveillance required.*  Author: Maximino DELENA Sharps, MD 12/11/2023 5:26 PM  For on call review [email or web address].

## 2023-12-11 NOTE — Progress Notes (Signed)
 PT placed on BIPAP by RT.   12/11/23 2100  BiPAP/CPAP/SIPAP  $ Non-Invasive Ventilator  Non-Invasive Vent Subsequent  $ Face Mask Medium Yes  BiPAP/CPAP/SIPAP Pt Type Adult  BiPAP/CPAP/SIPAP SERVO  Mask Type Full face mask  Dentures removed? Not applicable  Mask Size Medium  Respiratory Rate 31 breaths/min  Pressure Support 5 cmH20  PEEP 8 cmH20  FiO2 (%) 40 %  Minute Ventilation 18.3  Leak 33  Tidal Volume (Vt) 574  Patient Home Machine No  Patient Home Mask No  Patient Home Tubing No  Auto Titrate No  Press High Alarm 30 cmH2O  Press Low Alarm 3 cmH2O  Nasal massage performed Yes  CPAP/SIPAP surface wiped down Yes  Device Plugged into RED Power Outlet Yes

## 2023-12-11 NOTE — Sepsis Progress Note (Signed)
 Code Sepsis protocol being monitored by eLink.

## 2023-12-11 NOTE — Patient Outreach (Signed)
 12/11/2023  Patient ID: Calvin Escobar, male   DOB: 18-Feb-1951, 73 y.o.   MRN: [id number]  Pt readmitted to hospital 12/11/23, case closure.  Mliss Creed [facility], BSN RN Care Manager/ Transition of Care [place]/ [facility] [phone]

## 2023-12-11 NOTE — ED Triage Notes (Signed)
 BIB [facility] from home. Home health provider came in for Pt noticed Tachypnea. A&O x4 Recent d/c from the hosptial for a yeast infection. Patient denies symptoms.  RR 36  Temp 103 Oral 120HR  25-26 Capnography  132 CBG  96% RA  BP 113/65

## 2023-12-12 ENCOUNTER — Telehealth: Payer: Self-pay | Admitting: *Deleted

## 2023-12-12 ENCOUNTER — Inpatient Hospital Stay (HOSPITAL_COMMUNITY)

## 2023-12-12 DIAGNOSIS — N401 Enlarged prostate with lower urinary tract symptoms: Secondary | ICD-10-CM | POA: Diagnosis not present

## 2023-12-12 DIAGNOSIS — E875 Hyperkalemia: Secondary | ICD-10-CM | POA: Diagnosis not present

## 2023-12-12 DIAGNOSIS — B9620 Unspecified Escherichia coli [E. coli] as the cause of diseases classified elsewhere: Secondary | ICD-10-CM

## 2023-12-12 DIAGNOSIS — R7881 Bacteremia: Secondary | ICD-10-CM

## 2023-12-12 DIAGNOSIS — B379 Candidiasis, unspecified: Secondary | ICD-10-CM

## 2023-12-12 DIAGNOSIS — U071 COVID-19: Secondary | ICD-10-CM | POA: Diagnosis not present

## 2023-12-12 DIAGNOSIS — N179 Acute kidney failure, unspecified: Secondary | ICD-10-CM | POA: Diagnosis not present

## 2023-12-12 LAB — BLOOD CULTURE ID PANEL (REFLEXED) - BCID2

## 2023-12-12 LAB — BASIC METABOLIC PANEL WITH GFR
Anion gap: 10 (ref 5–15)
BUN: 32 mg/dL — ABNORMAL HIGH (ref 8–23)
CO2: 23 mmol/L (ref 22–32)
Calcium: 8.5 mg/dL — ABNORMAL LOW (ref 8.9–10.3)
Chloride: 104 mmol/L (ref 98–111)
Creatinine, Ser: 2.48 mg/dL — ABNORMAL HIGH (ref 0.61–1.24)
GFR, Estimated: 26 mL/min — ABNORMAL LOW (ref >60)
Glucose, Bld: 166 mg/dL — ABNORMAL HIGH (ref 70–99)
Potassium: 5.2 mmol/L — ABNORMAL HIGH (ref 3.5–5.1)
Sodium: 137 mmol/L (ref 135–145)

## 2023-12-12 LAB — CBC
HCT: 26.9 % — ABNORMAL LOW (ref 39.0–52.0)
Hemoglobin: 8.3 g/dL — ABNORMAL LOW (ref 13.0–17.0)
MCH: 28.9 pg (ref 26.0–34.0)
MCHC: 30.9 g/dL (ref 30.0–36.0)
MCV: 93.7 fL (ref 80.0–100.0)
Platelets: 167 K/uL (ref 150–400)
RBC: 2.87 MIL/uL — ABNORMAL LOW (ref 4.22–5.81)
RDW: 14.6 % (ref 11.5–15.5)
WBC: 9.1 K/uL (ref 4.0–10.5)
nRBC: 0 % (ref 0.0–0.2)

## 2023-12-12 LAB — PROCALCITONIN: Procalcitonin: 4.66 ng/mL

## 2023-12-12 LAB — C-REACTIVE PROTEIN: CRP: 22.8 mg/dL — ABNORMAL HIGH (ref <1.0)

## 2023-12-12 LAB — BRAIN NATRIURETIC PEPTIDE: B Natriuretic Peptide: 731.8 pg/mL — ABNORMAL HIGH (ref 0.0–100.0)

## 2023-12-12 MED ORDER — BUDESONIDE 0.25 MG/2ML IN SUSP
0.2500 mg | Freq: Two times a day (BID) | RESPIRATORY_TRACT | Status: DC
  Administered 2023-12-12 – 2023-12-19 (×14): 0.25 mg via RESPIRATORY_TRACT
  Filled 2023-12-12 (×14): qty 2

## 2023-12-12 MED ORDER — SODIUM ZIRCONIUM CYCLOSILICATE 5 G PO PACK
5.0000 g | PACK | Freq: Two times a day (BID) | ORAL | Status: AC
  Administered 2023-12-12: 5 g via ORAL
  Filled 2023-12-12 (×2): qty 1

## 2023-12-12 MED ORDER — METHYLPREDNISOLONE SODIUM SUCC 125 MG IJ SOLR
125.0000 mg | Freq: Every day | INTRAMUSCULAR | Status: DC
  Administered 2023-12-12: 125 mg via INTRAVENOUS
  Filled 2023-12-12 (×2): qty 2

## 2023-12-12 MED ORDER — SODIUM CHLORIDE 0.9 % IV SOLN
2.0000 g | INTRAVENOUS | Status: DC
  Administered 2023-12-12 – 2023-12-17 (×6): 2 g via INTRAVENOUS
  Filled 2023-12-12 (×6): qty 20

## 2023-12-12 MED ORDER — CHLORHEXIDINE GLUCONATE CLOTH 2 % EX PADS
6.0000 | MEDICATED_PAD | Freq: Every day | CUTANEOUS | Status: DC
  Administered 2023-12-12 – 2023-12-19 (×8): 6 via TOPICAL

## 2023-12-12 MED ORDER — FUROSEMIDE 10 MG/ML IJ SOLN
60.0000 mg | Freq: Once | INTRAMUSCULAR | Status: AC
  Administered 2023-12-12: 60 mg via INTRAVENOUS
  Filled 2023-12-12: qty 6

## 2023-12-12 MED ORDER — FUROSEMIDE 10 MG/ML IJ SOLN
80.0000 mg | Freq: Two times a day (BID) | INTRAMUSCULAR | Status: DC
  Administered 2023-12-13: 80 mg via INTRAVENOUS
  Filled 2023-12-12: qty 8

## 2023-12-12 MED ORDER — LACTATED RINGERS IV SOLN: INTRAVENOUS | Status: DC

## 2023-12-12 MED ORDER — LEVALBUTEROL HCL 0.63 MG/3ML IN NEBU
0.6300 mg | INHALATION_SOLUTION | Freq: Three times a day (TID) | RESPIRATORY_TRACT | Status: DC
  Administered 2023-12-12 – 2023-12-13 (×2): 0.63 mg via RESPIRATORY_TRACT
  Filled 2023-12-12 (×2): qty 3

## 2023-12-12 MED ORDER — HALOPERIDOL LACTATE 5 MG/ML IJ SOLN
2.0000 mg | Freq: Four times a day (QID) | INTRAMUSCULAR | Status: DC | PRN
  Administered 2023-12-12 – 2023-12-15 (×4): 2 mg via INTRAVENOUS
  Filled 2023-12-12 (×4): qty 1

## 2023-12-12 MED ORDER — HEPARIN SODIUM (PORCINE) 5000 UNIT/ML IJ SOLN
5000.0000 [IU] | Freq: Three times a day (TID) | INTRAMUSCULAR | Status: DC
  Administered 2023-12-12 – 2023-12-19 (×22): 5000 [IU] via SUBCUTANEOUS
  Filled 2023-12-12 (×23): qty 1

## 2023-12-12 NOTE — Progress Notes (Signed)
 Rec'd pt on bipap, pt was awake, alert and oriented.  No distress noted, pt denies SOB.  Per discussion w/ Dr Raenelle okay to remove bipap at this time.  Sat 100% on 4 lpm [place].

## 2023-12-12 NOTE — Progress Notes (Signed)
 After patient got to the floor noted to be in significant respiratory distress with wheezing heard on physical exam.  Initial chest x-ray showed no acute abnormality.  BNP was noted to be elevated but clinically patient does not appear to be fluid overloaded and with worsening kidney function.  COVID-19 order set utilized and patient started on Solu-Medrol  IV per protocol.  Patient has a history of SVT with albuterol  so orders placed for levalbuterol .  Due to his respiratory distress orders placed for BiPAP to see if it would ease patient's work of breathing.

## 2023-12-12 NOTE — Consult Note (Signed)
 Consult Note  Tarek Cravens [facility] [birth year]  [id number].    Requesting MD: Donalda Applebaum, MD Chief Complaint/Reason for Consult:  Colovesical fistula with recurrent UTI/bacteremia   HPI:  Patient is a 73 year old male who is currently admitted with fever and cough. He was febrile to 103 at home and noted to be tachypneic with poor appetite over the last 2-3 days. Recently hospitalized 6/30-7/5 with hematuria which resolved, had foley placed by urology prior to hospital admission. He has had recurrent UTIs and bacteremias thought to be secondary to colovesical fistula. Noted to have E. Coli bacteremia this admission. Also noted to be COVID positive. He was seen in follow up by Dr. Sheldon in the office 09/23/23 and updated colonoscopy was recommended as well as cardiac clearance. Discussion was had regarding robotic rectosigmoid resection with takedown of colovesical fistula and patient was in agreement to proceed, does not appear surgery has been scheduled yet. He did get colonoscopy on 7/3 and showed luminal narrowing in the distal sigmoid colon with suspicion for this being possible site of fistula. Has seen cardiology and RCRI determined to be 6.6% and myoview stress test recommended to ensure adequate function prior to surgery.    PMH otherwise significant for HFpEF with most recent ECHO showing EF 50-55%, HLD, CKD stage 3a, class 1 obesity, OSA, anemia of chronic disease, BPH with hx of urethral stricture, bipolar disorder, mild dementia. No prior abdominal surgery. Not on blood thinners. Allergic to albuterol .   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 History of GI bleeding (07/2023). Currently on ferrous sulfate  supplementation.   DIAGNOSTIC ASSESSMENT: ? Primary etiology (70-80% probability): Anemia of chronic disease/inflammation ? Secondary considerations: Iron deficiency component (elevated RD   DCM (dilated cardiomyopathy) (HCC)    nonischemic with normal coronary arteries at cath 06/2019.  EF 35 to 40% on echo 04/2021   Dementia (HCC)    Depression    Diarrhea 05/13/2023   E coli bacteremia 05/14/2023   High anion gap metabolic acidosis 07/13/2023   History of Clostridioides difficile colitis 08/15/2023   Associated with colovesical fistula? 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  Endoscopy esophageal stricture 2016     Septic shock from UTI 06/02/2023   Severe sepsis (HCC) 06/03/2023   SKIN CANCER, HX OF 05/10/2007   Facial left check and forehead and r shoulder  F/w derm   Syncope 05/13/2023   Thrombocytopenia (HCC) 06/02/2023   Lab Results      Component    Value    Date           PLT  301    07/19/2023           PLT    324    07/18/2023           PLT    268    07/16/2023           PLT    207    07/14/2023           PLT    228    07/13/2023             Lab Results      Component    Value    Date           ESRSEDRATE    60 (H)    05/08/2023     No results found for: CRP          Lab Results      Component    Value       Urinary catheter complication (HCC) 06/24/2023   Urinary dribbling 03/14/2022   Urinary incontinence  11/21/2022      Urinary Incontinence: They are wearing disposable diapers daily due to urinary incontinence, primarily nocturnal, and are currently on Tamsulosin  (Flomax ). We will refer them to Urology for further evaluation and potential treatment options and continue Tamsulosin  until they are seen by Urology.     Urinary tract infection without hematuria 06/02/2023   UTI (urinary tract infection) 07/01/2023    Past Surgical History:  Procedure Laterality Date   COLONOSCOPY     COLONOSCOPY N/A 12/05/2023   Procedure: COLONOSCOPY;  Surgeon: Leigh Elspeth SQUIBB, MD;  Location: [facility] ENDOSCOPY;  Service: Gastroenterology;  Laterality: N/A;   RIGHT/LEFT HEART CATH AND CORONARY ANGIOGRAPHY N/A 07/03/2019   Procedure: RIGHT/LEFT HEART CATH AND CORONARY ANGIOGRAPHY;  Surgeon: Cherrie Toribio SAUNDERS, MD;  Location: [facility] INVASIVE CV LAB;  Service: Cardiovascular;  Laterality: N/A;    Social History:  reports that he has never smoked. He has never used smokeless tobacco. He reports that he does not currently use alcohol after a past usage of about 14.0 standard drinks of alcohol per week. He reports that he does not use drugs.  Allergies:  Allergies  Allergen Reactions   Albuterol  Palpitations    Supraventricular Tachycardia     Medications Prior to Admission  Medication Sig Dispense Refill   ARIPiprazole  (ABILIFY ) 2 MG tablet Take 1 tablet (2 mg total) by mouth in the morning. 30 tablet 0   busPIRone  (BUSPAR ) 10 MG tablet Take 1 tablet (10 mg total) by mouth 3 (three) times daily. 270 tablet 3   carvedilol  (COREG ) 12.5 MG tablet Take 12.5 mg by mouth 2 (two) times daily.     finasteride  (PROSCAR ) 5 MG tablet Take 5 mg by mouth daily.     fluconazole  (DIFLUCAN ) 200 MG tablet Take 2 tablets (400 mg total) by mouth daily for 14 days. 28 tablet 0   folic acid  (FOLVITE ) 1 MG tablet Take 1 mg by mouth daily.     gabapentin  (NEURONTIN ) 100 MG capsule Take 1 capsule (100 mg total) by mouth 3 (three) times  daily. 90 capsule 3   losartan  (COZAAR ) 50 MG tablet Take 50 mg by mouth daily.     melatonin 3 MG TABS tablet Take 1 tablet (3 mg total) by mouth at bedtime as needed (insomnia). (Patient taking differently: Take 3 mg by mouth at bedtime.)     midodrine  (PROAMATINE ) 5 MG tablet Take 1 tablet (5 mg total) by mouth 3 (three)  times daily with meals. 90 tablet 2   mirtazapine  (REMERON ) 30 MG tablet Take 1 tablet (30 mg total) by mouth at bedtime. 90 tablet 3   pravastatin  (PRAVACHOL ) 20 MG tablet Take 1 tablet (20 mg total) by mouth at bedtime. 90 tablet 3   primidone  (MYSOLINE ) 50 MG tablet Take 1 tablet (50 mg total) by mouth 2 (two) times daily. (Patient taking differently: Take 50 mg by mouth 3 (three) times daily.)     tamsulosin  (FLOMAX ) 0.4 MG CAPS capsule TAKE 1 CAPSULE BY MOUTH EVERY DAY 90 capsule 1   traZODone  (DESYREL ) 100 MG tablet Take 1 tablet (100 mg total) by mouth at bedtime.     venlafaxine  XR (EFFEXOR -XR) 150 MG 24 hr capsule Take 150 mg by mouth daily.      Blood pressure 125/79, pulse 62, temperature 98.1 F (36.7 C), temperature source Oral, resp. rate 18, SpO2 100%. Physical Exam:  General: pleasant, WD, obese male who is laying in bed in NAD HEENT: head is normocephalic, atraumatic.  Sclera are noninjected.  Pupils equal and round.  Ears and nose without any masses or lesions.  Mouth is pink and moist Heart: regular, rate, and rhythm.  Palpable radial and pedal pulses bilaterally Lungs: No wheezes, rhonchi, or rales noted.  Respiratory effort nonlabored on room air Abd: soft, NT, ND, no masses, hernias, or organomegaly MS: all 4 extremities are symmetrical with no cyanosis, clubbing, or edema. Skin: warm and dry with no masses, lesions, or rashes Neuro: Cranial nerves 2-12 grossly intact, sensation is normal throughout Psych: A&Ox3 with an appropriate affect.   Results for orders placed or performed during the hospital encounter of 12/11/23 (from the past 48 hours)   Resp panel by RT-PCR (RSV, Flu A&B, Covid) Anterior Nasal Swab     Status: Abnormal   Collection Time: 12/11/23 12:29 PM   Specimen: Anterior Nasal Swab  Result Value Ref Range   SARS Coronavirus 2 by RT PCR POSITIVE (A) NEGATIVE   Influenza A by PCR NEGATIVE NEGATIVE   Influenza B by PCR NEGATIVE NEGATIVE    Comment: (NOTE) The Xpert Xpress SARS-CoV-2/FLU/RSV plus assay is intended as an aid in the diagnosis of influenza from Nasopharyngeal swab specimens and should not be used as a sole basis for treatment. Nasal washings and aspirates are unacceptable for Xpert Xpress SARS-CoV-2/FLU/RSV testing.  Fact Sheet for Patients: BloggerCourse.com  Fact Sheet for Healthcare Providers: SeriousBroker.it  This test is not yet approved or cleared by the [place]  FDA and has been authorized for detection and/or diagnosis of SARS-CoV-2 by FDA under an Emergency Use Authorization (EUA). This EUA will remain in effect (meaning this test can be used) for the duration of the COVID-19 declaration under Section 564(b)(1) of the Act, 21 U.S.C. section 360bbb-3(b)(1), unless the authorization is terminated or revoked.     Resp Syncytial Virus by PCR NEGATIVE NEGATIVE    Comment: (NOTE) Fact Sheet for Patients: BloggerCourse.com  Fact Sheet for Healthcare Providers: SeriousBroker.it  This test is not yet approved or cleared by the [place]  FDA and has been authorized for detection and/or diagnosis of SARS-CoV-2 by FDA under an Emergency Use Authorization (EUA). This EUA will remain in effect (meaning this test can be used) for the duration of the COVID-19 declaration under Section 564(b)(1) of the Act, 21 U.S.C. section 360bbb-3(b)(1), unless the authorization is terminated or revoked.  Performed at [facility], [address] [place], [place] [address]   Blood Culture  (routine  x 2)     Status: None (Preliminary result)   Collection Time: 12/11/23  1:30 PM   Specimen: BLOOD RIGHT FOREARM  Result Value Ref Range   Specimen Description BLOOD RIGHT FOREARM    Special Requests      BOTTLES DRAWN AEROBIC AND ANAEROBIC Blood Culture results may not be optimal due to an inadequate volume of blood received in culture bottles   Culture  Setup Time      GRAM NEGATIVE RODS IN BOTH AEROBIC AND ANAEROBIC BOTTLES CRITICAL RESULT CALLED TO, READ BACK BY AND VERIFIED WITH: MAYA AGENT LEDFORD [id number] [id number] BY JINNY COMMON, MT Performed at [facility], [address], [place], [place] [address]    Culture GRAM NEGATIVE RODS    Report Status PENDING   Brain natriuretic peptide     Status: Abnormal   Collection Time: 12/11/23  1:30 PM  Result Value Ref Range   B Natriuretic Peptide 455.8 (H) 0.0 - 100.0 pg/mL    Comment: Performed at [facility], [address], [place], [place] [address]  Blood Culture ID Panel (Reflexed)     Status: Abnormal   Collection Time: 12/11/23  1:30 PM  Result Value Ref Range   Enterococcus faecalis NOT DETECTED NOT DETECTED   Enterococcus Faecium NOT DETECTED NOT DETECTED   Listeria monocytogenes NOT DETECTED NOT DETECTED   Staphylococcus species NOT DETECTED NOT DETECTED   Staphylococcus aureus (BCID) NOT DETECTED NOT DETECTED   Staphylococcus epidermidis NOT DETECTED NOT DETECTED   Staphylococcus lugdunensis NOT DETECTED NOT DETECTED   Streptococcus species NOT DETECTED NOT DETECTED   Streptococcus agalactiae NOT DETECTED NOT DETECTED   Streptococcus pneumoniae NOT DETECTED NOT DETECTED   Streptococcus pyogenes NOT DETECTED NOT DETECTED   A.calcoaceticus-baumannii NOT DETECTED NOT DETECTED   Bacteroides fragilis NOT DETECTED NOT DETECTED   Enterobacterales DETECTED (A) NOT DETECTED    Comment: Enterobacterales represent a large order of gram negative bacteria, not a single organism. CRITICAL RESULT CALLED TO, READ BACK  BY AND VERIFIED WITH: PHARMD JAMES LEDFORD [phone] [id number] BY J RAZZAK, MT    Enterobacter cloacae complex NOT DETECTED NOT DETECTED   Escherichia coli DETECTED (A) NOT DETECTED    Comment: CRITICAL RESULT CALLED TO, READ BACK BY AND VERIFIED WITH: PHARMD JAMES LEDFORD [phone] [id number] BY J RAZZAK, MT    Klebsiella aerogenes NOT DETECTED NOT DETECTED   Klebsiella oxytoca NOT DETECTED NOT DETECTED   Klebsiella pneumoniae NOT DETECTED NOT DETECTED   Proteus species NOT DETECTED NOT DETECTED   Salmonella species NOT DETECTED NOT DETECTED   Serratia marcescens NOT DETECTED NOT DETECTED   Haemophilus influenzae NOT DETECTED NOT DETECTED   Neisseria meningitidis NOT DETECTED NOT DETECTED   Pseudomonas aeruginosa NOT DETECTED NOT DETECTED   Stenotrophomonas maltophilia NOT DETECTED NOT DETECTED   Candida albicans NOT DETECTED NOT DETECTED   Candida auris NOT DETECTED NOT DETECTED   Candida glabrata NOT DETECTED NOT DETECTED   Candida krusei NOT DETECTED NOT DETECTED   Candida parapsilosis NOT DETECTED NOT DETECTED   Candida tropicalis NOT DETECTED NOT DETECTED   Cryptococcus neoformans/gattii NOT DETECTED NOT DETECTED   CTX-M ESBL NOT DETECTED NOT DETECTED   Carbapenem resistance IMP NOT DETECTED NOT DETECTED   Carbapenem resistance KPC NOT DETECTED NOT DETECTED   Carbapenem resistance NDM NOT DETECTED NOT DETECTED   Carbapenem resist OXA 48 LIKE NOT DETECTED NOT DETECTED   Carbapenem resistance VIM NOT DETECTED NOT DETECTED    Comment: Performed at [facility], [address], [place], [place] [address]  Comprehensive metabolic panel     Status: Abnormal   Collection Time: 12/11/23  1:44 PM  Result Value Ref Range   Sodium 136 135 - 145 mmol/L   Potassium 5.9 (H) 3.5 - 5.1 mmol/L   Chloride 102 98 - 111 mmol/L   CO2 21 (L) 22 - 32 mmol/L   Glucose, Bld 113 (H) 70 - 99 mg/dL    Comment: Glucose reference range applies only to samples taken after fasting for at least 8 hours.   BUN  25 (H) 8 - 23 mg/dL   Creatinine, Ser 7.59 (H) 0.61 - 1.24 mg/dL   Calcium  8.5 (L) 8.9 - 10.3 mg/dL   Total Protein 6.6 6.5 - 8.1 g/dL   Albumin  2.9 (L) 3.5 - 5.0 g/dL   AST 15 15 - 41 U/L   ALT 9 0 - 44 U/L   Alkaline Phosphatase 59 38 - 126 U/L   Total Bilirubin 0.3 0.0 - 1.2 mg/dL   GFR, Estimated 27 (L) >60 mL/min    Comment: (NOTE) Calculated using the CKD-EPI Creatinine Equation (2021)    Anion gap 13 5 - 15    Comment: Performed at [facility], [address] [place], [place] [address]  CBC with Differential     Status: Abnormal   Collection Time: 12/11/23  1:44 PM  Result Value Ref Range   WBC 10.8 (H) 4.0 - 10.5 K/uL   RBC 2.85 (L) 4.22 - 5.81 MIL/uL   Hemoglobin 8.2 (L) 13.0 - 17.0 g/dL   HCT 73.2 (L) 60.9 - 47.9 %   MCV 93.7 80.0 - 100.0 fL   MCH 28.8 26.0 - 34.0 pg   MCHC 30.7 30.0 - 36.0 g/dL   RDW 85.2 88.4 - 84.4 %   Platelets 211 150 - 400 K/uL   nRBC 0.0 0.0 - 0.2 %   Neutrophils Relative % 75 %   Neutro Abs 8.2 (H) 1.7 - 7.7 K/uL   Lymphocytes Relative 7 %   Lymphs Abs 0.8 0.7 - 4.0 K/uL   Monocytes Relative 15 %   Monocytes Absolute 1.6 (H) 0.1 - 1.0 K/uL   Eosinophils Relative 1 %   Eosinophils Absolute 0.1 0.0 - 0.5 K/uL   Basophils Relative 1 %   Basophils Absolute 0.1 0.0 - 0.1 K/uL   Immature Granulocytes 1 %   Abs Immature Granulocytes 0.08 (H) 0.00 - 0.07 K/uL    Comment: Performed at [facility], [address] [place], [place] [address]  Protime-INR     Status: Abnormal   Collection Time: 12/11/23  1:44 PM  Result Value Ref Range   Prothrombin Time 15.7 (H) 11.4 - 15.2 seconds   INR 1.2 0.8 - 1.2    Comment: (NOTE) INR goal varies based on device and disease states. Performed at [facility], [address], [place], [place] [address]   Troponin I (High Sensitivity)     Status: Abnormal   Collection Time: 12/11/23  1:44 PM  Result Value Ref Range   Troponin I (High Sensitivity) 33 (H) <18 ng/L    Comment:  (NOTE) Elevated high sensitivity troponin I (hsTnI) values and significant  changes across serial measurements may suggest ACS but many other  chronic and acute conditions are known to elevate hsTnI results.  Refer to the Links section for chest pain algorithms and additional  guidance. Performed at [facility], [address], [place], [place] [address]   I-Stat Chem 8, ED     Status: Abnormal   Collection Time: 12/11/23  1:46 PM  Result Value Ref Range   Sodium 134 (L) 135 - 145 mmol/L   Potassium 5.9 (H) 3.5 - 5.1 mmol/L   Chloride 102 98 - 111 mmol/L   BUN 27 (H) 8 - 23 mg/dL   Creatinine, Ser 7.49 (H) 0.61 - 1.24 mg/dL   Glucose, Bld 888 (H) 70 - 99 mg/dL    Comment: Glucose reference range applies only to samples taken after fasting for at least 8 hours.   Calcium , Ion 1.12 (L) 1.15 - 1.40 mmol/L   TCO2 22 22 - 32 mmol/L   Hemoglobin 8.5 (L) 13.0 - 17.0 g/dL   HCT 74.9 (L) 60.9 - 47.9 %  I-Stat venous blood gas, ED ([facility])     Status: Abnormal   Collection Time: 12/11/23  1:46 PM  Result Value Ref Range   pH, Ven 7.352 7.25 - 7.43   pCO2, Ven 41.9 (L) 44 - 60 mmHg   pO2, Ven 85 (H) 32 - 45 mmHg   Bicarbonate 23.2 20.0 - 28.0 mmol/L   TCO2 25 22 - 32 mmol/L   O2 Saturation 96 %   Acid-base deficit 2.0 0.0 - 2.0 mmol/L   Sodium 135 135 - 145 mmol/L   Potassium 5.8 (H) 3.5 - 5.1 mmol/L   Calcium , Ion 1.12 (L) 1.15 - 1.40 mmol/L   HCT 24.0 (L) 39.0 - 52.0 %   Hemoglobin 8.2 (L) 13.0 - 17.0 g/dL   Sample type VENOUS   I-Stat Lactic Acid, ED     Status: None   Collection Time: 12/11/23  1:47 PM  Result Value Ref Range   Lactic Acid, Venous 1.2 0.5 - 1.9 mmol/L  Type and screen [facility]     Status: None   Collection Time: 12/11/23  6:29 PM  Result Value Ref Range   ABO/RH(D) O POS    Antibody Screen NEG    Sample Expiration      12/14/2023,2359 Performed at [facility], [address], [place], [place] [address]   Blood Culture  (routine x 2)     Status: None (Preliminary result)   Collection Time: 12/11/23  6:30 PM   Specimen: BLOOD  Result Value Ref Range   Specimen Description BLOOD SITE NOT SPECIFIED    Special Requests      BOTTLES DRAWN AEROBIC AND ANAEROBIC Blood Culture adequate volume   Culture      NO GROWTH < 12 HOURS Performed at [name], [address] [place], [place] [address]    Report Status PENDING   Troponin I (High Sensitivity)     Status: Abnormal   Collection Time: 12/11/23  6:34 PM  Result Value Ref Range   Troponin I (High Sensitivity) 31 (H) <18 ng/L    Comment: (NOTE) Elevated high sensitivity troponin I (hsTnI) values and significant  changes across serial measurements may suggest ACS but many other  chronic and acute conditions are known to elevate hsTnI results.  Refer to the Links section for chest pain algorithms and additional  guidance. Performed at [facility], [address], [place], [place] [address]   Hemoglobin and hematocrit, blood     Status: Abnormal   Collection Time: 12/11/23  6:34 PM  Result Value Ref Range   Hemoglobin 8.6 (L) 13.0 - 17.0 g/dL   HCT 71.4 (  L) 39.0 - 52.0 %    Comment: Performed at [facility], [address], [place], [place] [address]  C-reactive protein     Status: Abnormal   Collection Time: 12/12/23  6:31 AM  Result Value Ref Range   CRP 22.8 (H) <1.0 mg/dL    Comment: Performed at [facility], [address] [place], [place] [address]  Procalcitonin     Status: None   Collection Time: 12/12/23  6:31 AM  Result Value Ref Range   Procalcitonin 4.66 ng/mL    Comment:        Interpretation: PCT > 2 ng/mL: Systemic infection (sepsis) is likely, unless other causes are known. (NOTE)       Sepsis PCT Algorithm           Lower Respiratory Tract                                      Infection PCT Algorithm    ----------------------------     ----------------------------         PCT < 0.25 ng/mL                 PCT < 0.10 ng/mL          Strongly encourage             Strongly discourage   discontinuation of antibiotics    initiation of antibiotics    ----------------------------     -----------------------------       PCT 0.25 - 0.50 ng/mL            PCT 0.10 - 0.25 ng/mL               OR       >80% decrease in PCT            Discourage initiation of                                            antibiotics      Encourage discontinuation           of antibiotics    ----------------------------     -----------------------------         PCT >= 0.50 ng/mL              PCT 0.26 - 0.50 ng/mL               AND       <80% decrease in PCT              Encourage initiation of                                             antibiotics       Encourage continuation           of antibiotics    ----------------------------     -----------------------------        PCT >= 0.50 ng/mL                  PCT > 0.50 ng/mL               AND  increase in PCT                  Strongly encourage                                      initiation of antibiotics    Strongly encourage escalation           of antibiotics                                     -----------------------------                                           PCT <= 0.25 ng/mL                                                 OR                                        > 80% decrease in PCT                                      Discontinue / Do not initiate                                             antibiotics  Performed at [facility], [address], [place], [place] [address]   CBC     Status: Abnormal   Collection Time: 12/12/23  6:36 AM  Result Value Ref Range   WBC 9.1 4.0 - 10.5 K/uL   RBC 2.87 (L) 4.22 - 5.81 MIL/uL   Hemoglobin 8.3 (L) 13.0 - 17.0 g/dL   HCT 73.0 (L) 60.9 - 47.9 %   MCV 93.7 80.0 - 100.0 fL   MCH 28.9 26.0 - 34.0 pg   MCHC 30.9 30.0 - 36.0 g/dL   RDW 85.3 88.4 - 84.4 %   Platelets 167 150 - 400 K/uL    nRBC 0.0 0.0 - 0.2 %    Comment: Performed at [facility], [address], [place], [place] [address]  Basic metabolic panel     Status: Abnormal   Collection Time: 12/12/23  6:36 AM  Result Value Ref Range   Sodium 137 135 - 145 mmol/L   Potassium 5.2 (H) 3.5 - 5.1 mmol/L   Chloride 104 98 - 111 mmol/L   CO2 23 22 - 32 mmol/L   Glucose, Bld 166 (H) 70 - 99 mg/dL    Comment: Glucose reference range applies only to samples taken after fasting for at least 8 hours.   BUN 32 (H) 8 - 23 mg/dL   Creatinine, Ser 7.51 (H) 0.61 - 1.24 mg/dL   Calcium  8.5 (L) 8.9 - 10.3 mg/dL   GFR, Estimated 26 (L) >60  mL/min    Comment: (NOTE) Calculated using the CKD-EPI Creatinine Equation (2021)    Anion gap 10 5 - 15    Comment: Performed at [facility], [address], [place], [place] [address]   DG Chest Port 1V same Day Result Date: 12/12/2023 CLINICAL DATA:  Shortness of breath EXAM: PORTABLE CHEST 1 VIEW COMPARISON:  12/11/2023 FINDINGS: Single frontal view of the chest demonstrates borderline enlargement the cardiac silhouette. No airspace disease, effusion, or pneumothorax. No acute bony abnormalities. IMPRESSION: 1. Borderline enlargement of the cardiac silhouette. 2. No acute airspace disease. Electronically Signed   By: Ozell Daring M.D.   On: 12/12/2023 09:32   DG CHEST PORT 1 VIEW Result Date: 12/11/2023 CLINICAL DATA:  [id number] SOB (shortness of breath) [id number] EXAM: PORTABLE CHEST 1 VIEW COMPARISON:  AP radiograph the chest dated December 11, 2023. FINDINGS: The heart is normal in size. Pulmonary vasculature is unremarkable. There is a calcified granuloma in the right midlung zone. The lungs appear clear otherwise. IMPRESSION: No active disease. Electronically Signed   By: Evalene Coho M.D.   On: 12/11/2023 21:04   DG Chest Port 1 View Result Date: 12/11/2023 CLINICAL DATA:  Questionable sepsis - evaluate for abnormality. Tachypnea. EXAM: PORTABLE CHEST 1 VIEW COMPARISON:   07/12/2023. FINDINGS: Stable calcified granuloma overlying the right lower lung zone, laterally. Bilateral lung fields are clear. No acute consolidation or lung collapse. Bilateral costophrenic angles are clear. Stable cardio-mediastinal silhouette. No acute osseous abnormalities. The soft tissues are within normal limits. IMPRESSION: No active disease. Electronically Signed   By: Ree Molt M.D.   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 Coli bacteremia - will discuss with patient's primary surgeon to determine if surgical intervention should be pursued this admission while inpatient vs schedule outpatient allowing time for continued outpatient medical clearance and resolution in COVID symptoms  - if surgical intervention recommended acutely then would recommend cardiology involvement for clearance and optimization prior to surgical intervention  - would need to complete bowel prep but fine to continue diet for now since no definitive plan and not obstructed  - we will follow up     FEN: HH diet, LR @50cc /h VTE: SQH ID: IV rocephin , PO diflucan    I reviewed hospitalist notes, last 24 h vitals and pain scores, last 48 h intake and output, last 24 h labs and trends, and last 24 h imaging results.  This care required high  level of medical decision making.   Burnard JONELLE Louder, [facility] 12/12/2023, 12:15 PM Please see Amion for pager number during day hours 7:00am-4:30pm

## 2023-12-12 NOTE — Progress Notes (Signed)
 Complex Care Management Note Care Guide Note  12/12/2023 Name: Calvin Escobar MRN: [id number] DOB: 02-Jun-1951  [name] is a 73 y.o. year old male who is a primary care patient of [name], MD . The community resource team was consulted for assistance with Transportation Needs   SDOH screenings and interventions completed:  Yes     SDOH Interventions Today    Flowsheet Row Most Recent Value  SDOH Interventions   Transportation Interventions [facility] ([facility] [employer])    [place] will mail wife the application for the [place] access transportation  Care guide performed the following interventions: Patient provided with information about care guide support team and interviewed to confirm resource needs.  Follow Up Plan:  No further follow up planned at this time. The patient has been provided with needed resources.  Encounter Outcome:  Patient Visit Completed Ashtyn Freilich Greenauer-Moran  [facility] HealthPopulation Health Care Guide  Direct Dial:[phone] [phone] Website: [email or web address]

## 2023-12-12 NOTE — Care Management (Signed)
 Transition of Care [facility]) - Inpatient Brief Assessment   Patient Details  Name: Calvin Escobar MRN: [id number] Date of Birth: 1950-08-21  Transition of Care [facility]) CM/SW Contact:    Corean JAYSON Canary, RN Phone Number: 12/12/2023, 3:29 PM   Clinical Narrative:  One of many admission for this 73 year old with high readmission rate. He last had [facility] services in March with [facility]. He has chronic colovesical fistula, frequently gets sepsis, has indwelling foley at home. Has had transportation difficulty, [facility] following and sent resources to wife. General surgery consulted, TOC will continue to follow for needs, recommendations, and transitions of care/  Transition of Care Asessment: Insurance and Status: Insurance coverage has been reviewed Patient has primary care physician: Yes Home environment has been reviewed: Lives with wife Prior level of function:: With assist- has been admitted several times   Social Drivers of Health Review: SDOH reviewed needs interventions Readmission risk has been reviewed: Yes Transition of care needs: transition of care needs identified, TOC will continue to follow

## 2023-12-12 NOTE — Progress Notes (Signed)
 PHARMACY - PHYSICIAN COMMUNICATION CRITICAL VALUE ALERT - BLOOD CULTURE IDENTIFICATION (BCID)  Calvin Escobar is an 73 y.o. male who presented to [facility] on 12/11/2023   Name of physician (or Provider) Contacted: Dr. Marcene   Current antibiotics: Fluconazole  PO  Changes to prescribed antibiotics recommended:  Add Ceftriaxone  2g IV q24h  Results for orders placed or performed during the hospital encounter of 12/11/23  Blood Culture ID Panel (Reflexed) (Collected: 12/11/2023  1:30 PM)  Result Value Ref Range   Enterococcus faecalis NOT DETECTED NOT DETECTED   Enterococcus Faecium NOT DETECTED NOT DETECTED   Listeria monocytogenes NOT DETECTED NOT DETECTED   Staphylococcus species NOT DETECTED NOT DETECTED   Staphylococcus aureus (BCID) NOT DETECTED NOT DETECTED   Staphylococcus epidermidis NOT DETECTED NOT DETECTED   Staphylococcus lugdunensis NOT DETECTED NOT DETECTED   Streptococcus species NOT DETECTED NOT DETECTED   Streptococcus agalactiae NOT DETECTED NOT DETECTED   Streptococcus pneumoniae NOT DETECTED NOT DETECTED   Streptococcus pyogenes NOT DETECTED NOT DETECTED   A.calcoaceticus-baumannii NOT DETECTED NOT DETECTED   Bacteroides fragilis NOT DETECTED NOT DETECTED   Enterobacterales DETECTED (A) NOT DETECTED   Enterobacter cloacae complex NOT DETECTED NOT DETECTED   Escherichia coli DETECTED (A) NOT DETECTED   Klebsiella aerogenes NOT DETECTED NOT DETECTED   Klebsiella oxytoca NOT DETECTED NOT DETECTED   Klebsiella pneumoniae NOT DETECTED NOT DETECTED   Proteus species NOT DETECTED NOT DETECTED   Salmonella species NOT DETECTED NOT DETECTED   Serratia marcescens NOT DETECTED NOT DETECTED   Haemophilus influenzae NOT DETECTED NOT DETECTED   Neisseria meningitidis NOT DETECTED NOT DETECTED   Pseudomonas aeruginosa NOT DETECTED NOT DETECTED   Stenotrophomonas maltophilia NOT DETECTED NOT DETECTED   Candida albicans NOT DETECTED NOT DETECTED   Candida auris NOT  DETECTED NOT DETECTED   Candida glabrata NOT DETECTED NOT DETECTED   Candida krusei NOT DETECTED NOT DETECTED   Candida parapsilosis NOT DETECTED NOT DETECTED   Candida tropicalis NOT DETECTED NOT DETECTED   Cryptococcus neoformans/gattii NOT DETECTED NOT DETECTED   CTX-M ESBL NOT DETECTED NOT DETECTED   Carbapenem resistance IMP NOT DETECTED NOT DETECTED   Carbapenem resistance KPC NOT DETECTED NOT DETECTED   Carbapenem resistance NDM NOT DETECTED NOT DETECTED   Carbapenem resist OXA 48 LIKE NOT DETECTED NOT DETECTED   Carbapenem resistance VIM NOT DETECTED NOT DETECTED    Lynwood Mckusick, PharmD, BCPS Clinical Pharmacist Phone: [phone]

## 2023-12-12 NOTE — Consult Note (Addendum)
 Date of Admission:  12/11/2023          Reason for Consult: Colovesicular fistula, Candidemia, E coli bacteremia and COVID    Referring Provider: Arna Applebaum, MD   Assessment:  COVID E coli bacteremia C. Albicans candidemia #2 and #3 colovesicular fistula Dementia BPH   Plan:  Agree with remdesivir  Airborne and contact precautions Agree with ceftriaxone  Repeat blood cultures Continue fluconazole  for Candida albicans note he had that dilated funduscopic exam was excluded fungal endophthalmitis.  Principal Problem:   COVID-19 Active Problems:   Dementia (HCC)   HLD (hyperlipidemia)   Hyperkalemia   Anemia of chronic disease   Acute kidney injury superimposed on chronic kidney disease (HCC)   Colovesical fistula   Hypocalcemia   Candidemia (HCC)   BPH with urinary obstruction   Anxiety   Bipolar disorder (HCC)   SIRS (systemic inflammatory response syndrome) (HCC)   Leukocytosis   Scheduled Meds:  ARIPiprazole   2 mg Oral q AM   aspirin   324 mg Oral Once   budesonide  (PULMICORT ) nebulizer solution  0.25 mg Nebulization BID   busPIRone   10 mg Oral TID   carvedilol   12.5 mg Oral BID   Chlorhexidine  Gluconate Cloth  6 each Topical Daily   finasteride   5 mg Oral Daily   fluconazole   400 mg Oral Daily   folic acid   1 mg Oral Daily   [START ON 12/13/2023] furosemide   80 mg Intravenous BID   gabapentin   100 mg Oral TID   guaiFENesin   600 mg Oral BID   heparin  injection (subcutaneous)  5,000 Units Subcutaneous Q8H   levalbuterol   0.63 mg Nebulization TID   melatonin  3 mg Oral QHS   methylPREDNISolone  (SOLU-MEDROL ) injection  125 mg Intravenous Daily   midodrine   5 mg Oral TID WC   mirtazapine   30 mg Oral QHS   pravastatin   20 mg Oral QHS   primidone   50 mg Oral TID   sodium chloride  flush  3 mL Intravenous Q12H   sodium zirconium cyclosilicate   5 g Oral BID   tamsulosin   0.4 mg Oral Daily   traZODone   100 mg Oral QHS   venlafaxine  XR  150 mg Oral  Daily   Continuous Infusions:  cefTRIAXone  (ROCEPHIN )  IV 2 g (12/12/23 0458)   remdesivir  100 mg in sodium chloride  0.9 % 100 mL IVPB 100 mg (12/12/23 0832)   PRN Meds:.acetaminophen  **OR** acetaminophen , haloperidol  lactate, levalbuterol   HPI: Jamael Hoffmann is a 73 y.o. male with multiple medical problems that I saw earlier this month including alcohol use disorder dementia cardiomyopathy and chronic colovesicular fistula who was admitted with candidemia.  When we saw him last he was discharged on fluconazole .  Since discharge he developed fever and cough and was seen in the ER where he tested positive for COVID-19.  Blood cultures were taken as well and these revealed an E. coli in 1 of 2 cultures.  He is currently on remdesivir  for COVID, continued fluconazole  and also on ceftriaxone  for his E. coli.  He has been evaluated by general surgery after having had colonoscopy with GI during last admission he still would like to have surgery this admission but Dr. Sheldon does not have options for him until August he also needs cardiology clearance as well prior to surgery.  I would continue him on fluconazole  at present and also continue ceftriaxone .  Will repeat blood cultures tomorrow.  Remdesivir  reasonable for his COVID pneumoonia  I have personally spent 84 minutes involved in face-to-face and non-face-to-face activities for this patient on the day of the visit. Professional time spent includes the following activities: Preparing to see the patient (review of tests), Obtaining and/or reviewing separately obtained history (admission/discharge record), Performing a medically appropriate examination and/or evaluation , Ordering medications/tests/procedures, referring and communicating with other health care professionals, Documenting clinical information in the EMR, Independently interpreting results (not separately reported), Communicating results to the patient/family/caregiver,  Counseling and educating the patient/family/caregiver and Care coordination (not separately reported).   Evaluation of the patient requires complex antimicrobial therapy evaluation, counseling , isolation needs to reduce disease transmission and risk assessment and mitigation.     Review of Systems: Review of Systems  Constitutional:  Positive for fever. Negative for chills, malaise/fatigue and weight loss.  HENT:  Negative for congestion and sore throat.   Eyes:  Negative for blurred vision and photophobia.  Respiratory:  Positive for cough. Negative for shortness of breath and wheezing.   Cardiovascular:  Negative for chest pain, palpitations and leg swelling.  Gastrointestinal:  Negative for abdominal pain, blood in stool, constipation, diarrhea, heartburn, melena, nausea and vomiting.  Genitourinary:  Negative for dysuria, flank pain and hematuria.  Musculoskeletal:  Negative for back pain, falls, joint pain and myalgias.  Skin:  Negative for itching and rash.  Neurological:  Negative for dizziness, focal weakness, loss of consciousness, weakness and headaches.  Endo/Heme/Allergies:  Does not bruise/bleed easily.  Psychiatric/Behavioral:  Negative for depression and suicidal ideas. The patient does not have insomnia.     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 History of GI bleeding (07/2023). Currently on ferrous sulfate  supplementation.   DIAGNOSTIC ASSESSMENT: ? Primary  etiology (70-80% probability): Anemia of chronic disease/inflammation ? Secondary considerations: Iron deficiency component (elevated RD   DCM (dilated cardiomyopathy) (HCC)    nonischemic with normal coronary arteries at cath 06/2019.  EF 35 to 40% on echo 04/2021   Dementia (HCC)    Depression    Diarrhea 05/13/2023   E coli bacteremia 05/14/2023   High anion gap metabolic acidosis 07/13/2023   History of Clostridioides difficile colitis 08/15/2023   Associated with colovesical fistula? 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  Endoscopy esophageal stricture 2016     Septic shock from UTI 06/02/2023   Severe sepsis (HCC) 06/03/2023   SKIN CANCER, HX OF 05/10/2007   Facial left check and forehead and r shoulder  F/w derm   Syncope 05/13/2023   Thrombocytopenia (HCC) 06/02/2023   Lab Results      Component    Value    Date           PLT    301    07/19/2023           PLT    324  07/18/2023           PLT    268    07/16/2023           PLT    207    07/14/2023           PLT    228    07/13/2023             Lab Results      Component    Value    Date           ESRSEDRATE    60 (H)    05/08/2023     No results found for: CRP          Lab Results      Component    Value       Urinary catheter complication (HCC) 06/24/2023   Urinary dribbling 03/14/2022   Urinary incontinence 11/21/2022      Urinary Incontinence: They are wearing disposable diapers daily due to urinary incontinence, primarily nocturnal, and are currently on Tamsulosin  (Flomax ). We will refer them to Urology for further evaluation and potential treatment options and continue Tamsulosin  until they are seen by Urology.     Urinary tract infection without hematuria 06/02/2023   UTI (urinary tract infection) 07/01/2023    Social History   Tobacco Use    Smoking status: Never   Smokeless tobacco: Never  Vaping Use   Vaping status: Never Used  Substance Use Topics   Alcohol use: Not Currently    Alcohol/week: 14.0 standard drinks of alcohol    Types: 14 Cans of beer per week   Drug use: No    Family History  Problem Relation Age of Onset   Hypertension Mother    Cancer Father    Bone cancer Sister    Allergies  Allergen Reactions   Albuterol  Palpitations    Supraventricular Tachycardia     OBJECTIVE: Blood pressure 125/79, pulse (!) 129, temperature 98.1 F (36.7 C), temperature source Oral, resp. rate 20, SpO2 100%.  Physical Exam Constitutional:      Appearance: He is well-developed.  HENT:     Head: Normocephalic and atraumatic.  Eyes:     Conjunctiva/sclera: Conjunctivae normal.  Cardiovascular:     Rate and Rhythm: Normal rate and regular rhythm.  Pulmonary:     Effort: Pulmonary effort is normal. No respiratory distress.     Breath sounds: No wheezing.  Abdominal:     General: There is no distension.     Palpations: Abdomen is soft.  Musculoskeletal:        General: No tenderness. Normal range of motion.     Cervical back: Normal range of motion and neck supple.  Skin:    General: Skin is warm and dry.     Coloration: Skin is not pale.     Findings: No erythema or rash.  Neurological:     General: No focal deficit present.     Mental Status: He is alert and oriented to person, place, and time.  Psychiatric:        Mood and Affect: Mood normal.        Behavior: Behavior normal.        Thought Content: Thought content normal.        Judgment: Judgment normal.     Lab Results Lab Results  Component Value Date   WBC 9.1 12/12/2023   HGB 8.3 (L) 12/12/2023   HCT 26.9 (L) 12/12/2023   MCV  93.7 12/12/2023   PLT 167 12/12/2023    Lab Results  Component Value Date   CREATININE 2.48 (H) 12/12/2023   BUN 32 (H) 12/12/2023   NA 137 12/12/2023   K 5.2 (H) 12/12/2023   CL 104 12/12/2023   CO2 23  12/12/2023    Lab Results  Component Value Date   ALT 9 12/11/2023   AST 15 12/11/2023   ALKPHOS 59 12/11/2023   BILITOT 0.3 12/11/2023     Microbiology: Recent Results (from the past 240 hours)  Resp panel by RT-PCR (RSV, Flu A&B, Covid) Anterior Nasal Swab     Status: Abnormal   Collection Time: 12/11/23 12:29 PM   Specimen: Anterior Nasal Swab  Result Value Ref Range Status   SARS Coronavirus 2 by RT PCR POSITIVE (A) NEGATIVE Final   Influenza A by PCR NEGATIVE NEGATIVE Final   Influenza B by PCR NEGATIVE NEGATIVE Final    Comment: (NOTE) The Xpert Xpress SARS-CoV-2/FLU/RSV plus assay is intended as an aid in the diagnosis of influenza from Nasopharyngeal swab specimens and should not be used as a sole basis for treatment. Nasal washings and aspirates are unacceptable for Xpert Xpress SARS-CoV-2/FLU/RSV testing.  Fact Sheet for Patients: BloggerCourse.com  Fact Sheet for Healthcare Providers: SeriousBroker.it  This test is not yet approved or cleared by the [place]  FDA and has been authorized for detection and/or diagnosis of SARS-CoV-2 by FDA under an Emergency Use Authorization (EUA). This EUA will remain in effect (meaning this test can be used) for the duration of the COVID-19 declaration under Section 564(b)(1) of the Act, 21 U.S.C. section 360bbb-3(b)(1), unless the authorization is terminated or revoked.     Resp Syncytial Virus by PCR NEGATIVE NEGATIVE Final    Comment: (NOTE) Fact Sheet for Patients: BloggerCourse.com  Fact Sheet for Healthcare Providers: SeriousBroker.it  This test is not yet approved or cleared by the [place]  FDA and has been authorized for detection and/or diagnosis of SARS-CoV-2 by FDA under an Emergency Use Authorization (EUA). This EUA will remain in effect (meaning this test can be used) for the duration of the COVID-19  declaration under Section 564(b)(1) of the Act, 21 U.S.C. section 360bbb-3(b)(1), unless the authorization is terminated or revoked.  Performed at [facility], [address] [place], [place] [address]   Blood Culture (routine x 2)     Status: None (Preliminary result)   Collection Time: 12/11/23  1:30 PM   Specimen: BLOOD RIGHT FOREARM  Result Value Ref Range Status   Specimen Description BLOOD RIGHT FOREARM  Final   Special Requests   Final    BOTTLES DRAWN AEROBIC AND ANAEROBIC Blood Culture results may not be optimal due to an inadequate volume of blood received in culture bottles   Culture  Setup Time   Final    GRAM NEGATIVE RODS IN BOTH AEROBIC AND ANAEROBIC BOTTLES CRITICAL RESULT CALLED TO, READ BACK BY AND VERIFIED WITH: MAYA AGENT LEDFORD [id number] BY JINNY COMMON, MT Performed at [facility], [address], [place], [place] [address]    Culture GRAM NEGATIVE RODS  Final   Report Status PENDING  Incomplete  Blood Culture ID Panel (Reflexed)     Status: Abnormal   Collection Time: 12/11/23  1:30 PM  Result Value Ref Range Status   Enterococcus faecalis NOT DETECTED NOT DETECTED Final   Enterococcus Faecium NOT DETECTED NOT DETECTED Final   Listeria monocytogenes NOT DETECTED NOT DETECTED Final   Staphylococcus  species NOT DETECTED NOT DETECTED Final   Staphylococcus aureus (BCID) NOT DETECTED NOT DETECTED Final   Staphylococcus epidermidis NOT DETECTED NOT DETECTED Final   Staphylococcus lugdunensis NOT DETECTED NOT DETECTED Final   Streptococcus species NOT DETECTED NOT DETECTED Final   Streptococcus agalactiae NOT DETECTED NOT DETECTED Final   Streptococcus pneumoniae NOT DETECTED NOT DETECTED Final   Streptococcus pyogenes NOT DETECTED NOT DETECTED Final   A.calcoaceticus-baumannii NOT DETECTED NOT DETECTED Final   Bacteroides fragilis NOT DETECTED NOT DETECTED Final   Enterobacterales DETECTED (A) NOT DETECTED Final    Comment: Enterobacterales  represent a large order of gram negative bacteria, not a single organism. CRITICAL RESULT CALLED TO, READ BACK BY AND VERIFIED WITH: PHARMD JAMES LEDFORD [id number] BY J RAZZAK, MT    Enterobacter cloacae complex NOT DETECTED NOT DETECTED Final   Escherichia coli DETECTED (A) NOT DETECTED Final    Comment: CRITICAL RESULT CALLED TO, READ BACK BY AND VERIFIED WITH: PHARMD JAMES LEDFORD [id number] BY J RAZZAK, MT    Klebsiella aerogenes NOT DETECTED NOT DETECTED Final   Klebsiella oxytoca NOT DETECTED NOT DETECTED Final   Klebsiella pneumoniae NOT DETECTED NOT DETECTED Final   Proteus species NOT DETECTED NOT DETECTED Final   Salmonella species NOT DETECTED NOT DETECTED Final   Serratia marcescens NOT DETECTED NOT DETECTED Final   Haemophilus influenzae NOT DETECTED NOT DETECTED Final   Neisseria meningitidis NOT DETECTED NOT DETECTED Final   Pseudomonas aeruginosa NOT DETECTED NOT DETECTED Final   Stenotrophomonas maltophilia NOT DETECTED NOT DETECTED Final   Candida albicans NOT DETECTED NOT DETECTED Final   Candida auris NOT DETECTED NOT DETECTED Final   Candida glabrata NOT DETECTED NOT DETECTED Final   Candida krusei NOT DETECTED NOT DETECTED Final   Candida parapsilosis NOT DETECTED NOT DETECTED Final   Candida tropicalis NOT DETECTED NOT DETECTED Final   Cryptococcus neoformans/gattii NOT DETECTED NOT DETECTED Final   CTX-M ESBL NOT DETECTED NOT DETECTED Final   Carbapenem resistance IMP NOT DETECTED NOT DETECTED Final   Carbapenem resistance KPC NOT DETECTED NOT DETECTED Final   Carbapenem resistance NDM NOT DETECTED NOT DETECTED Final   Carbapenem resist OXA 48 LIKE NOT DETECTED NOT DETECTED Final   Carbapenem resistance VIM NOT DETECTED NOT DETECTED Final    Comment: Performed at [facility], [address] [place], [place] [address]  Blood Culture (routine x 2)     Status: None (Preliminary result)   Collection Time: 12/11/23  6:30 PM   Specimen: BLOOD   Result Value Ref Range Status   Specimen Description BLOOD SITE NOT SPECIFIED  Final   Special Requests   Final    BOTTLES DRAWN AEROBIC AND ANAEROBIC Blood Culture adequate volume   Culture   Final    NO GROWTH < 12 HOURS Performed at [facility], [address] [place], [place] [address]    Report Status PENDING  Incomplete    Jomarie Fleeta Rothman, MD [facility] for Infectious Disease [facility] [phone] pager  12/12/2023, 6:23 PM

## 2023-12-12 NOTE — Progress Notes (Signed)
 Complex Care Management Note Care Guide Note  12/12/2023 Name: Dmauri Rosenow MRN: [id number] DOB: 1951-05-17   Complex Care Management Outreach Attempts: An unsuccessful telephone outreach was attempted today to offer the patient information about available complex care management services.  Follow Up Plan:  Additional outreach attempts will be made to offer the patient complex care management information and services.   Encounter Outcome:  No Answer  [facility] HealthPopulation Health Care Guide  Direct [phone] Fax:[phone] Website: [email or web address]

## 2023-12-12 NOTE — Significant Event (Signed)
 Rapid Response Event Note   Reason for Call :  SOB  Initial Focused Assessment:  Called to room for pt desaturating. On my arrival, pt already placed on Bipap in the room. He is able to answer questions appropriately and states feeling better with the mask on. He was on 2L [place] prior but RN says O2 was off on her arrival into room. Pt states he was trying to get up to go to the bathroom when he got SOB.   VS HR 131, BP 125/79, RR 32, spO2 98% on Bipap 100%fiO2   Lungs noted with wheezing bilaterally. Md and RT at bedside on my arrival.   Interventions:  Bipap placed PTA PIV inserted 60 Lasix  Solumedrol Neb treatment CXR Haldol   Plan of Care:  Continue to monitor pt status. Keep O2 on pt. Instructed not to get out of bed. RN to call with any changes or concerns.   Event Summary:   MD Notified: PTA and at bedside on arrival Call Time: 1608 Arrival Time: 1610 End Time: 1645  Nat Blunt, RN

## 2023-12-12 NOTE — Progress Notes (Signed)
 Called STAT to bedside for rapid response in progress d/t pt w/ sudden SOB and desat.  Upon entering room, RN just placed pt on bipap, pt appeared dusky/gray, sat not reading initially.    After several minutes on bipap, pt appears to be slowly improving, pt states breathing is feeling better on bipap.  Breath sounds noted wheezes and crackles, prn neb given. MD in room assessing pt and placing orders.  Rapid response RN in room currently.  Sat is now 100%.

## 2023-12-12 NOTE — Progress Notes (Addendum)
 PROGRESS NOTE        PATIENT DETAILS Name: Calvin Escobar Age: 73 y.o. Sex: male Date of Birth: 16-Jun-1950 Admit Date: 12/11/2023 Admitting Physician Maximino DELENA Sharps, MD ERE:[id number], [name], MD  Brief Summary: Patient is a 73 y.o.  male with history of colovesical fistula-numerous recent hospitalizations for sepsis related to UTI/bacteremia-just discharged from this facility on 7/5 for candidemia on oral Diflucan -presented to the hospital with fever-found to have COVID-19 infection and E. coli bacteremia.  Significant events: 7/9>> admit to [facility]-fever-COVID-19 infection/E. coli bacteremia.  Significant studies: 6/30>> CT abdomen/pelvis: Marked bladder wall thickening with surrounding edema/fluid-suspected tract from sigmoid colon to posterior bladder.  Significant microbiology data: 7/9>> COVID PCR: Positive 7/9>> influenza /RSV PCR: Negative. 7/9>> blood gram culture: Gram-negative rod  Procedures: 7/3>> colonoscopy-diverticulosis, severe luminal narrowing distal sigmoid  Consults: ID  Subjective: Lying comfortably in bed-denies any chest pain or shortness of breath.  Feels much better today.  Liberated off BiPAP-initially was on oxygen but stable on room air.  Objective: Vitals: Blood pressure 124/69, pulse 62, temperature 97.9 F (36.6 C), temperature source Oral, resp. rate 18, SpO2 100%.   Exam: Gen Exam:Alert awake-not in any distress HEENT:atraumatic, normocephalic Chest: Few bibasilar rales with clear to auscultation. CVS:S1S2 regular Abdomen:soft non tender, non distended Extremities:no edema Neurology: Non focal Skin: no rash  Pertinent Labs/Radiology:    Latest Ref Rng & Units 12/12/2023    6:36 AM 12/11/2023    6:34 PM 12/11/2023    1:46 PM  CBC  WBC 4.0 - 10.5 K/uL 9.1     Hemoglobin 13.0 - 17.0 g/dL 8.3  8.6  8.5    8.2   Hematocrit 39.0 - 52.0 % 26.9  28.5  25.0    24.0   Platelets 150 - 400 K/uL 167       Lab  Results  Component Value Date   NA 137 12/12/2023   K 5.2 (H) 12/12/2023   CL 104 12/12/2023   CO2 23 12/12/2023      Assessment/Plan: Severe sepsis secondary to polymicrobial bloodstream infection (candidemia on 6/18 and E. coli bacteremia 7/9)-present on admission (fever/tachypnea/tachycardia/leukocytosis with evidence of AKI) Sepsis physiology has improved Polymicrobial bloodstream infection-likely secondary to symptomatic chronic colovesical fistula Already on fluconazole  from prior admission Remains on IV Rocephin  ID consulted CCS consulted to see if we can get definite source control.  Patient has had numerous recurrent hospitalizations for UTI/bacteremia since December 2024 all likely due to symptomatic colovesical fistula.  COVID-19 infection Some cough/URI symptoms-however no pneumonia noted on CXR x 2 His spouse and son have similar symptoms at home Not sure exactly why he required BiPAP-suspect he may have been tachypneic in the context of fever/acute illness. Liberated off BiPAP today-subsequently placed on 2 L of oxygen-when I saw him he was on room air. Given the risk profile-reasonable to continue remdesivir  x 3 days Stopping Solu-Medrol -as he is not hypoxic/no pneumonia. Elevated CRP is likely a sequelae of bacteremia/colovesical fistula rather than COVID-19 pneumonitis.  AKI on CKD stage IIIa AKI likely hemodynamic mediated in the setting of sepsis Gently hydrate Avoid nephrotoxic agents Recent CT abdomen 6/30-without any major hydronephrosis. Follow electrolytes  Hyperkalemia Due to AKI Mild Lokelma  x 2 doses today-repeat electrolytes tomorrow Hopefully with improvement in renal function-hyperkalemia will resolve  Normocytic anemia Due to combination of acute illness/ongoing CKD Follow CBC  History of colovesical fistula Likely cause of recurrent infections/bacteremia/candidemia S/p recent colonoscopy-with sigmoid stricture-suspected to be the site of  fistula. CCS consulted today-see above-  History of BPH History of urethral stricture-s/p dilated by Dr. Alvaro on 6/28 Recommendations by urology (consult note on 6/28) were to keep Foley catheter in place-monthly changes recommended.  History of mood disorder Stable Abilify /BuSpar /trazodone /Effexor   History of intention tremor Primidone .  Class 1 Obesity: Estimated body mass index is 32.29 kg/m as calculated from the following:   Height as of 12/05/23: 6' (1.829 m).   Weight as of 12/05/23: 108 kg.   Code status:   Code Status: Do not attempt resuscitation (DNR) PRE-ARREST INTERVENTIONS DESIRED   DVT Prophylaxis: SCDs Start: 12/11/23 1741   Family Communication: Spouse-[name] [phone] updated 7/10   Disposition Plan: Status is: Inpatient Remains inpatient appropriate because: Severity of illness   Planned Discharge Destination:Home health   Diet: Diet Order             Diet Heart Room service appropriate? Yes; Fluid consistency: Thin  Diet effective now                     Antimicrobial agents: Anti-infectives (From admission, onward)    Start     Dose/Rate Route Frequency Ordered Stop   12/12/23 1000  fluconazole  (DIFLUCAN ) tablet 400 mg        400 mg Oral Daily 12/11/23 1742     12/12/23 1000  remdesivir  100 mg in sodium chloride  0.9 % 100 mL IVPB       Placed in Followed by Linked Group   100 mg 200 mL/hr over 30 Minutes Intravenous Daily 12/11/23 1852 12/14/23 0959   12/12/23 0400  cefTRIAXone  (ROCEPHIN ) 2 g in sodium chloride  0.9 % 100 mL IVPB        2 g 200 mL/hr over 30 Minutes Intravenous Every 24 hours 12/12/23 0337     12/11/23 2000  remdesivir  200 mg in sodium chloride  0.9% 250 mL IVPB       Placed in Followed by Linked Group   200 mg 580 mL/hr over 30 Minutes Intravenous Once 12/11/23 1852 12/11/23 2201   12/11/23 1245  ceFEPIme  (MAXIPIME ) 2 g in sodium chloride  0.9 % 100 mL IVPB        2 g 200 mL/hr over 30 Minutes  Intravenous  Once 12/11/23 1230 12/11/23 1534   12/11/23 1245  metroNIDAZOLE  (FLAGYL ) IVPB 500 mg  Status:  Discontinued        500 mg 100 mL/hr over 60 Minutes Intravenous  Once 12/11/23 1230 12/11/23 1517   12/11/23 1245  vancomycin  (VANCOCIN ) IVPB 1000 mg/200 mL premix  Status:  Discontinued        1,000 mg 200 mL/hr over 60 Minutes Intravenous  Once 12/11/23 1230 12/11/23 1230   12/11/23 1245  vancomycin  (VANCOREADY) IVPB 2000 mg/400 mL  Status:  Discontinued        2,000 mg 200 mL/hr over 120 Minutes Intravenous  Once 12/11/23 1230 12/11/23 1517        MEDICATIONS: Scheduled Meds:  albuterol   2.5 mg Nebulization Once   ARIPiprazole   2 mg Oral q AM   aspirin   324 mg Oral Once   busPIRone   10 mg Oral TID   carvedilol   12.5 mg Oral BID   finasteride   5 mg Oral Daily   fluconazole   400 mg Oral Daily   folic acid   1 mg Oral Daily   gabapentin   100 mg Oral  TID   guaiFENesin   600 mg Oral BID   melatonin  3 mg Oral QHS   midodrine   5 mg Oral TID WC   mirtazapine   30 mg Oral QHS   pravastatin   20 mg Oral QHS   primidone   50 mg Oral TID   sodium chloride  flush  3 mL Intravenous Q12H   sodium zirconium cyclosilicate   5 g Oral BID   tamsulosin   0.4 mg Oral Daily   traZODone   100 mg Oral QHS   venlafaxine  XR  150 mg Oral Daily   Continuous Infusions:  cefTRIAXone  (ROCEPHIN )  IV 2 g (12/12/23 0458)   remdesivir  100 mg in sodium chloride  0.9 % 100 mL IVPB 100 mg (12/12/23 0832)   PRN Meds:.acetaminophen  **OR** acetaminophen , levalbuterol    I have personally reviewed following labs and imaging studies  LABORATORY DATA: CBC: Recent Labs  Lab 12/11/23 1344 12/11/23 1346 12/11/23 1834 12/12/23 0636  WBC 10.8*  --   --  9.1  NEUTROABS 8.2*  --   --   --   HGB 8.2* 8.2*  8.5* 8.6* 8.3*  HCT 26.7* 24.0*  25.0* 28.5* 26.9*  MCV 93.7  --   --  93.7  PLT 211  --   --  167    Basic Metabolic Panel: Recent Labs  Lab 12/11/23 1344 12/11/23 1346 12/12/23 0636  NA 136  135  134* 137  K 5.9* 5.8*  5.9* 5.2*  CL 102 102 104  CO2 21*  --  23  GLUCOSE 113* 111* 166*  BUN 25* 27* 32*  CREATININE 2.40* 2.50* 2.48*  CALCIUM  8.5*  --  8.5*    GFR: Estimated Creatinine Clearance: 32.7 mL/min (A) (by C-G formula based on SCr of 2.48 mg/dL (H)).  Liver Function Tests: Recent Labs  Lab 12/11/23 1344  AST 15  ALT 9  ALKPHOS 59  BILITOT 0.3  PROT 6.6  ALBUMIN  2.9*   No results for input(s): LIPASE, AMYLASE in the last 168 hours. No results for input(s): AMMONIA in the last 168 hours.  Coagulation Profile: Recent Labs  Lab 12/11/23 1344  INR 1.2    Cardiac Enzymes: No results for input(s): CKTOTAL, CKMB, CKMBINDEX, TROPONINI in the last 168 hours.  BNP (last 3 results) No results for input(s): PROBNP in the last 8760 hours.  Lipid Profile: No results for input(s): CHOL, HDL, LDLCALC, TRIG, CHOLHDL, LDLDIRECT in the last 72 hours.  Thyroid  Function Tests: No results for input(s): TSH, T4TOTAL, FREET4, T3FREE, THYROIDAB in the last 72 hours.  Anemia Panel: No results for input(s): VITAMINB12, FOLATE, FERRITIN, TIBC, IRON, RETICCTPCT in the last 72 hours.  Urine analysis:    Component Value Date/Time   COLORURINE RED (A) 12/02/2023 0532   APPEARANCEUR TURBID (A) 12/02/2023 0532   LABSPEC  12/02/2023 0532    TEST NOT REPORTED DUE TO COLOR INTERFERENCE OF URINE PIGMENT   PHURINE  12/02/2023 0532    TEST NOT REPORTED DUE TO COLOR INTERFERENCE OF URINE PIGMENT   GLUCOSEU (A) 12/02/2023 0532    TEST NOT REPORTED DUE TO COLOR INTERFERENCE OF URINE PIGMENT   HGBUR (A) 12/02/2023 0532    TEST NOT REPORTED DUE TO COLOR INTERFERENCE OF URINE PIGMENT   BILIRUBINUR (A) 12/02/2023 0532    TEST NOT REPORTED DUE TO COLOR INTERFERENCE OF URINE PIGMENT   KETONESUR (A) 12/02/2023 0532    TEST NOT REPORTED DUE TO COLOR INTERFERENCE OF URINE PIGMENT   PROTEINUR (A) 12/02/2023 0532    TEST NOT  REPORTED  DUE TO COLOR INTERFERENCE OF URINE PIGMENT   UROBILINOGEN 0.2 09/12/2013 1446   NITRITE (A) 12/02/2023 0532    TEST NOT REPORTED DUE TO COLOR INTERFERENCE OF URINE PIGMENT   LEUKOCYTESUR (A) 12/02/2023 0532    TEST NOT REPORTED DUE TO COLOR INTERFERENCE OF URINE PIGMENT    Sepsis Labs: Lactic Acid, Venous    Component Value Date/Time   LATICACIDVEN 1.2 12/11/2023 1347    MICROBIOLOGY: Recent Results (from the past 240 hours)  Fungus culture, blood     Status: None   Collection Time: 12/02/23  2:08 PM   Specimen: BLOOD  Result Value Ref Range Status   Specimen Description BLOOD SITE NOT SPECIFIED  Final   Special Requests   Final    BOTTLES DRAWN AEROBIC ONLY Blood Culture adequate volume   Culture   Final    NO GROWTH 7 DAYS NO FUNGUS ISOLATED Performed at [facility], [address] [place], [place] [address]    Report Status 12/09/2023 FINAL  Final  Resp panel by RT-PCR (RSV, Flu A&B, Covid) Anterior Nasal Swab     Status: Abnormal   Collection Time: 12/11/23 12:29 PM   Specimen: Anterior Nasal Swab  Result Value Ref Range Status   SARS Coronavirus 2 by RT PCR POSITIVE (A) NEGATIVE Final   Influenza A by PCR NEGATIVE NEGATIVE Final   Influenza B by PCR NEGATIVE NEGATIVE Final    Comment: (NOTE) The Xpert Xpress SARS-CoV-2/FLU/RSV plus assay is intended as an aid in the diagnosis of influenza from Nasopharyngeal swab specimens and should not be used as a sole basis for treatment. Nasal washings and aspirates are unacceptable for Xpert Xpress SARS-CoV-2/FLU/RSV testing.  Fact Sheet for Patients: BloggerCourse.com  Fact Sheet for Healthcare Providers: SeriousBroker.it  This test is not yet approved or cleared by the [place]  FDA and has been authorized for detection and/or diagnosis of SARS-CoV-2 by FDA under an Emergency Use Authorization (EUA). This EUA will remain in effect (meaning this test can be  used) for the duration of the COVID-19 declaration under Section 564(b)(1) of the Act, 21 U.S.C. section 360bbb-3(b)(1), unless the authorization is terminated or revoked.     Resp Syncytial Virus by PCR NEGATIVE NEGATIVE Final    Comment: (NOTE) Fact Sheet for Patients: BloggerCourse.com  Fact Sheet for Healthcare Providers: SeriousBroker.it  This test is not yet approved or cleared by the [place]  FDA and has been authorized for detection and/or diagnosis of SARS-CoV-2 by FDA under an Emergency Use Authorization (EUA). This EUA will remain in effect (meaning this test can be used) for the duration of the COVID-19 declaration under Section 564(b)(1) of the Act, 21 U.S.C. section 360bbb-3(b)(1), unless the authorization is terminated or revoked.  Performed at [facility], [address], [place], [place] [address]   Blood Culture (routine x 2)     Status: None (Preliminary result)   Collection Time: 12/11/23  1:30 PM   Specimen: BLOOD RIGHT FOREARM  Result Value Ref Range Status   Specimen Description BLOOD RIGHT FOREARM  Final   Special Requests   Final    BOTTLES DRAWN AEROBIC AND ANAEROBIC Blood Culture results may not be optimal due to an inadequate volume of blood received in culture bottles   Culture  Setup Time   Final    GRAM NEGATIVE RODS IN BOTH AEROBIC AND ANAEROBIC BOTTLES CRITICAL RESULT CALLED TO, READ BACK BY AND VERIFIED WITH: [facility] JAMES LEDFORD [phone] [id number] BY J  RAZZAK, MT Performed at [facility], [address] [place], [place] [address]    Culture GRAM NEGATIVE RODS  Final   Report Status PENDING  Incomplete  Blood Culture ID Panel (Reflexed)     Status: Abnormal   Collection Time: 12/11/23  1:30 PM  Result Value Ref Range Status   Enterococcus faecalis NOT DETECTED NOT DETECTED Final   Enterococcus Faecium NOT DETECTED NOT DETECTED Final   Listeria monocytogenes NOT DETECTED NOT  DETECTED Final   Staphylococcus species NOT DETECTED NOT DETECTED Final   Staphylococcus aureus (BCID) NOT DETECTED NOT DETECTED Final   Staphylococcus epidermidis NOT DETECTED NOT DETECTED Final   Staphylococcus lugdunensis NOT DETECTED NOT DETECTED Final   Streptococcus species NOT DETECTED NOT DETECTED Final   Streptococcus agalactiae NOT DETECTED NOT DETECTED Final   Streptococcus pneumoniae NOT DETECTED NOT DETECTED Final   Streptococcus pyogenes NOT DETECTED NOT DETECTED Final   A.calcoaceticus-baumannii NOT DETECTED NOT DETECTED Final   Bacteroides fragilis NOT DETECTED NOT DETECTED Final   Enterobacterales DETECTED (A) NOT DETECTED Final    Comment: Enterobacterales represent a large order of gram negative bacteria, not a single organism. CRITICAL RESULT CALLED TO, READ BACK BY AND VERIFIED WITH: PHARMD JAMES LEDFORD [phone] [id number] BY J RAZZAK, MT    Enterobacter cloacae complex NOT DETECTED NOT DETECTED Final   Escherichia coli DETECTED (A) NOT DETECTED Final    Comment: CRITICAL RESULT CALLED TO, READ BACK BY AND VERIFIED WITH: PHARMD JAMES LEDFORD [phone] [id number] BY J RAZZAK, MT    Klebsiella aerogenes NOT DETECTED NOT DETECTED Final   Klebsiella oxytoca NOT DETECTED NOT DETECTED Final   Klebsiella pneumoniae NOT DETECTED NOT DETECTED Final   Proteus species NOT DETECTED NOT DETECTED Final   Salmonella species NOT DETECTED NOT DETECTED Final   Serratia marcescens NOT DETECTED NOT DETECTED Final   Haemophilus influenzae NOT DETECTED NOT DETECTED Final   Neisseria meningitidis NOT DETECTED NOT DETECTED Final   Pseudomonas aeruginosa NOT DETECTED NOT DETECTED Final   Stenotrophomonas maltophilia NOT DETECTED NOT DETECTED Final   Candida albicans NOT DETECTED NOT DETECTED Final   Candida auris NOT DETECTED NOT DETECTED Final   Candida glabrata NOT DETECTED NOT DETECTED Final   Candida krusei NOT DETECTED NOT DETECTED Final   Candida parapsilosis NOT DETECTED NOT DETECTED Final    Candida tropicalis NOT DETECTED NOT DETECTED Final   Cryptococcus neoformans/gattii NOT DETECTED NOT DETECTED Final   CTX-M ESBL NOT DETECTED NOT DETECTED Final   Carbapenem resistance IMP NOT DETECTED NOT DETECTED Final   Carbapenem resistance KPC NOT DETECTED NOT DETECTED Final   Carbapenem resistance NDM NOT DETECTED NOT DETECTED Final   Carbapenem resist OXA 48 LIKE NOT DETECTED NOT DETECTED Final   Carbapenem resistance VIM NOT DETECTED NOT DETECTED Final    Comment: Performed at [facility], [address], [place], [place] [address]  Blood Culture (routine x 2)     Status: None (Preliminary result)   Collection Time: 12/11/23  6:30 PM   Specimen: BLOOD  Result Value Ref Range Status   Specimen Description BLOOD SITE NOT SPECIFIED  Final   Special Requests   Final    BOTTLES DRAWN AEROBIC AND ANAEROBIC Blood Culture adequate volume   Culture   Final    NO GROWTH < 12 HOURS Performed at [facility], [address], [place], [place] [address]    Report Status PENDING  Incomplete    RADIOLOGY STUDIES/RESULTS: DG Chest Port 1V  same Day Result Date: 12/12/2023 CLINICAL DATA:  Shortness of breath EXAM: PORTABLE CHEST 1 VIEW COMPARISON:  12/11/2023 FINDINGS: Single frontal view of the chest demonstrates borderline enlargement the cardiac silhouette. No airspace disease, effusion, or pneumothorax. No acute bony abnormalities. IMPRESSION: 1. Borderline enlargement of the cardiac silhouette. 2. No acute airspace disease. Electronically Signed   By: Ozell Daring M.D.   On: 12/12/2023 09:32   DG CHEST PORT 1 VIEW Result Date: 12/11/2023 CLINICAL DATA:  [id number] SOB (shortness of breath) [id number] EXAM: PORTABLE CHEST 1 VIEW COMPARISON:  AP radiograph the chest dated December 11, 2023. FINDINGS: The heart is normal in size. Pulmonary vasculature is unremarkable. There is a calcified granuloma in the right midlung zone. The lungs appear clear otherwise. IMPRESSION: No active disease.  Electronically Signed   By: Evalene Coho M.D.   On: 12/11/2023 21:04   DG Chest Port 1 View Result Date: 12/11/2023 CLINICAL DATA:  Questionable sepsis - evaluate for abnormality. Tachypnea. EXAM: PORTABLE CHEST 1 VIEW COMPARISON:  07/12/2023. FINDINGS: Stable calcified granuloma overlying the right lower lung zone, laterally. Bilateral lung fields are clear. No acute consolidation or lung collapse. Bilateral costophrenic angles are clear. Stable cardio-mediastinal silhouette. No acute osseous abnormalities. The soft tissues are within normal limits. IMPRESSION: No active disease. Electronically Signed   By: Ree Molt M.D.   On: 12/11/2023 13:08     LOS: 1 day   Donalda Applebaum, MD  [facility]    To contact the attending provider between 7A-7P or the covering provider during after hours 7P-7A, please log into the web site [email or web address] and access using universal [place] password for that web site. If you do not have the password, please call the hospital operator.  12/12/2023, 9:41 AM

## 2023-12-12 NOTE — Plan of Care (Signed)

## 2023-12-12 NOTE — Progress Notes (Signed)
 [facility] night cross cover note:  I was notified by RN that this patient is continues to pull at lines as well as Bipap mask, although these actions are redirectable with frequent verbal redirection.  In the setting of associated interference with ongoing medical treatment posing potential harm to themself, I have placed order for 1 to 1 sitter.    Eva Pore, DO Hospitalist

## 2023-12-13 ENCOUNTER — Inpatient Hospital Stay (HOSPITAL_COMMUNITY)

## 2023-12-13 ENCOUNTER — Encounter (HOSPITAL_COMMUNITY)

## 2023-12-13 DIAGNOSIS — E875 Hyperkalemia: Secondary | ICD-10-CM | POA: Diagnosis not present

## 2023-12-13 DIAGNOSIS — R0609 Other forms of dyspnea: Secondary | ICD-10-CM | POA: Diagnosis not present

## 2023-12-13 DIAGNOSIS — N179 Acute kidney failure, unspecified: Secondary | ICD-10-CM | POA: Diagnosis not present

## 2023-12-13 DIAGNOSIS — I5022 Chronic systolic (congestive) heart failure: Secondary | ICD-10-CM

## 2023-12-13 DIAGNOSIS — N401 Enlarged prostate with lower urinary tract symptoms: Secondary | ICD-10-CM | POA: Diagnosis not present

## 2023-12-13 DIAGNOSIS — U071 COVID-19: Secondary | ICD-10-CM | POA: Diagnosis not present

## 2023-12-13 LAB — BASIC METABOLIC PANEL WITH GFR
Anion gap: 11 (ref 5–15)
BUN: 52 mg/dL — ABNORMAL HIGH (ref 8–23)
CO2: 21 mmol/L — ABNORMAL LOW (ref 22–32)
Calcium: 8 mg/dL — ABNORMAL LOW (ref 8.9–10.3)
Chloride: 106 mmol/L (ref 98–111)
Creatinine, Ser: 2.75 mg/dL — ABNORMAL HIGH (ref 0.61–1.24)
GFR, Estimated: 23 mL/min — ABNORMAL LOW (ref >60)
Glucose, Bld: 184 mg/dL — ABNORMAL HIGH (ref 70–99)
Potassium: 5 mmol/L (ref 3.5–5.1)
Sodium: 138 mmol/L (ref 135–145)

## 2023-12-13 LAB — PROCALCITONIN: Procalcitonin: >150 ng/mL

## 2023-12-13 LAB — ECHOCARDIOGRAM COMPLETE
AR max vel: 3.6 cm2
AV Area VTI: 3.67 cm2
AV Area mean vel: 3.41 cm2
AV Mean grad: 2 mmHg
AV Peak grad: 3.6 mmHg
Ao pk vel: 0.95 m/s
S' Lateral: 4.9 cm

## 2023-12-13 LAB — CBC
HCT: 23.7 % — ABNORMAL LOW (ref 39.0–52.0)
Hemoglobin: 7.2 g/dL — ABNORMAL LOW (ref 13.0–17.0)
MCH: 28.6 pg (ref 26.0–34.0)
MCHC: 30.4 g/dL (ref 30.0–36.0)
MCV: 94 fL (ref 80.0–100.0)
Platelets: 116 K/uL — ABNORMAL LOW (ref 150–400)
RBC: 2.52 MIL/uL — ABNORMAL LOW (ref 4.22–5.81)
RDW: 14.6 % (ref 11.5–15.5)
WBC: 23.5 K/uL — ABNORMAL HIGH (ref 4.0–10.5)
nRBC: 0 % (ref 0.0–0.2)

## 2023-12-13 LAB — C-REACTIVE PROTEIN: CRP: 22.7 mg/dL — ABNORMAL HIGH (ref <1.0)

## 2023-12-13 LAB — D-DIMER, QUANTITATIVE: D-Dimer, Quant: 3.96 ug{FEU}/mL — ABNORMAL HIGH (ref 0.00–0.50)

## 2023-12-13 LAB — BRAIN NATRIURETIC PEPTIDE: B Natriuretic Peptide: 629.9 pg/mL — ABNORMAL HIGH (ref 0.0–100.0)

## 2023-12-13 MED ORDER — FUROSEMIDE 10 MG/ML IJ SOLN: 80.0000 mg | Freq: Every day | INTRAMUSCULAR | Status: DC

## 2023-12-13 MED ORDER — PREDNISONE 20 MG PO TABS
40.0000 mg | ORAL_TABLET | Freq: Every day | ORAL | Status: DC
  Administered 2023-12-13 – 2023-12-16 (×4): 40 mg via ORAL
  Filled 2023-12-13 (×3): qty 2

## 2023-12-13 NOTE — Evaluation (Signed)
 Physical Therapy Evaluation Patient Details Name: Calvin Escobar MRN: [id number] DOB: Oct 22, 1950 Today's Date: 12/13/2023  History of Present Illness  73 y.o. male presents to [facility] on 7/9 with tachycardia, tachypnea and cough. + COVID and ecoli bacteremia. PMH: recent admission for hematuria--went home with foley catheter, hyperlipidemia, systolic heart failure, history of colovesical fistula urinary retention anemia of chronic disease, bipolar disorder, dementia.  Clinical Impression  Pt presents with admitting diagnosis above. Co-treat with OT. Pt today was able to ambulate in room with HHA Min A. PTA pt was fully independent. Recommend HHPT upon DC. PT will continue to follow.         If plan is discharge home, recommend the following: A little help with walking and/or transfers;A little help with bathing/dressing/bathroom;Assistance with cooking/housework;Direct supervision/assist for medications management;Direct supervision/assist for financial management;Help with stairs or ramp for entrance;Supervision due to cognitive status;Assist for transportation   Can travel by private vehicle        Equipment Recommendations None recommended by PT  Recommendations for Other Services       Functional Status Assessment Patient has had a recent decline in their functional status and demonstrates the ability to make significant improvements in function in a reasonable and predictable amount of time.     Precautions / Restrictions Precautions Precautions: Fall Recall of Precautions/Restrictions: Intact Precaution/Restrictions Comments: indwelling urinary catheter Restrictions Weight Bearing Restrictions Per Provider Order: No      Mobility  Bed Mobility Overal bed mobility: Needs Assistance Bed Mobility: Supine to Sit     Supine to sit: Contact guard          Transfers Overall transfer level: Needs assistance Equipment used: None Transfers: Sit to/from Stand Sit to Stand:  Contact guard assist           General transfer comment: cues to stand momentarily as this is his first time up    Ambulation/Gait Ambulation/Gait assistance: Min assist Gait Distance (Feet): 15 Feet Assistive device: 1 person hand held assist Gait Pattern/deviations: Step-through pattern, Shuffle, Narrow base of support Gait velocity: slowed     General Gait Details: Min A HHA to ambulate to door and back. Pt rather unsteady however no overt LOB.  Stairs            Wheelchair Mobility     Tilt Bed    Modified Rankin (Stroke Patients Only)       Balance Overall balance assessment: Needs assistance Sitting-balance support: Feet supported Sitting balance-Leahy Scale: Good Sitting balance - Comments: reaches to floor to don shoes without LOB   Standing balance support: During functional activity, No upper extremity supported, Bilateral upper extremity supported Standing balance-Leahy Scale: Poor Standing balance comment: unilateral stabilization                             Pertinent Vitals/Pain Pain Assessment Pain Assessment: No/denies pain    Home Living Family/patient expects to be discharged to:: Private residence Living Arrangements: Spouse/significant other;Children Available Help at Discharge: Family;Available PRN/intermittently (family works opposite shifts) Type of Home: House Home Access: Stairs to enter Entrance Stairs-Rails: None Secretary/administrator of Steps: 8-9 Alternate Level Stairs-Number of Steps: flight Home Layout: Two level;Bed/bath upstairs Home Equipment: Agricultural consultant (2 wheels)      Prior Function Prior Level of Function : Independent/Modified Independent;Patient poor historian/Family not available             Mobility Comments: ind without AD, drives ADLs Comments:  spouse does meds management, finances, pt makes himself hot dogs when his family is not there, independent in self care     Extremity/Trunk  Assessment   Upper Extremity Assessment Upper Extremity Assessment: Overall WFL for tasks assessed    Lower Extremity Assessment Lower Extremity Assessment: Overall WFL for tasks assessed    Cervical / Trunk Assessment Cervical / Trunk Assessment: Kyphotic  Communication   Communication Communication: No apparent difficulties    Cognition Arousal: Alert Behavior During Therapy: Flat affect                             Following commands: Intact       Cueing Cueing Techniques: Verbal cues     General Comments General comments (skin integrity, edema, etc.): VSS on RA    Exercises     Assessment/Plan    PT Assessment Patient needs continued PT services  PT Problem List Decreased strength;Decreased activity tolerance;Decreased balance;Decreased mobility       PT Treatment Interventions DME instruction;Gait training;Stair training;Functional mobility training;Therapeutic activities;Therapeutic exercise;Balance training;Cognitive remediation;Patient/family education    PT Goals (Current goals can be found in the Care Plan section)  Acute Rehab PT Goals Patient Stated Goal: get stronger PT Goal Formulation: With patient Time For Goal Achievement: 12/27/23 Potential to Achieve Goals: Fair    Frequency Min 2X/week     Co-evaluation               AM-PAC PT 6 Clicks Mobility  Outcome Measure Help needed turning from your back to your side while in a flat bed without using bedrails?: A Little Help needed moving from lying on your back to sitting on the side of a flat bed without using bedrails?: A Little Help needed moving to and from a bed to a chair (including a wheelchair)?: A Little Help needed standing up from a chair using your arms (e.g., wheelchair or bedside chair)?: A Little Help needed to walk in hospital room?: A Little Help needed climbing 3-5 steps with a railing? : A Little 6 Click Score: 18    End of Session Equipment Utilized  During Treatment: Gait belt Activity Tolerance: Patient tolerated treatment well Patient left: in chair;with call bell/phone within reach;with chair alarm set Nurse Communication: Mobility status PT Visit Diagnosis: Unsteadiness on feet (R26.81);Other abnormalities of gait and mobility (R26.89);Muscle weakness (generalized) (M62.81);Difficulty in walking, not elsewhere classified (R26.2)    Time: 8940-8867 PT Time Calculation (min) (ACUTE ONLY): 33 min   Charges:   PT Evaluation $PT Eval Moderate Complexity: 1 Mod   PT General Charges $$ ACUTE PT VISIT: 1 Visit         Calvin Escobar, PT, DPT Acute Rehab Services [phone]   Calvin Escobar 12/13/2023, 3:10 PM

## 2023-12-13 NOTE — Consult Note (Addendum)
 Cardiology Consultation   Patient ID: Matyas Baisley MRN: [id number]; DOB: [birth year]  Admit date: 12/11/2023 Date of Consult: 12/13/2023  PCP:  [name], MD   [facility] Providers Cardiologist:  Wilbert Bihari, MD  Cardiology APP:  Daneen Damien BROCKS, NP    Patient Profile: Thomson Herbers is a 73 y.o. male with a hx of HFrEF, non-ischemic cardiomyopathy, dilated ascending aorta, HLD, OSA who is being seen 12/13/2023 for the evaluation of CHF, preoperative evaluation at the request of Dr. Dennise.  History of Present Illness:  Mr. Wimmer is a 73 year old male with above medical history who is followed by Dr. Wilbert Bihari.   Patient previously had echocardiogram in 06/2019 that showed EF 25-30%, mildly reduced RV systolic function.  Underwent right/left heart catheterization that showed normal coronary arteries. He was started on GDMT. Seen by the advanced heart failure clinic who felt he was not a candidate for advanced HF therapies. His most recent echocardiogram from 05/2023 showed EF 50-55%, abnormal septal motion but no regional wall motion abnormalities, normal RV systolic function, normal pulmonary artery systolic pressure, no significant valvular abnormalities.   Patient was last seen in clinic on 10/17/2023.  He presented for a preoperative evaluation pending robotic resection of the colon with bladder repair for colovesical fistula.  At that appointment, patient denies shortness of breath, chest pain, leg swelling.  He was able to lay flat at night without needing extra pillows.  Weight had been stable.  EKG showed new inferolateral T wave inversion so patient was set up for outpatient stress test.  Patient had been off GDMT due to hypotension since earlier this year, blood pressure still borderline for resuming.  Patient was seen by his PCP on 6/18 with a UTI.  Blood cultures were drawn at that appointment and returned positive for candidemia. He was admitted from  6/30 - 7/5 with candidemia.  ID was consulted, he was treated with micafungin .  During that admission he had hematuria and urinary retention.  CT showed possible cystitis with left hydronephrosis, urology recommended outpatient follow-up.  GI was consulted and performed a colonoscopy 7/3 that showed diverticulosis with severe luminal narrowing in the distal sigmoid which I suspect is the fistula area.  Patient presented back to the ED on 7/9.  His home health provider noted that patient was tachypneic.  Initial vital signs showed temperature 103F, HR 120 BPM, oxygen 96% on room air, BP 113/65. EKG showed sinus tachycardia with LBBB, heart rate 100 bpm, T wave inversions in V5, V6, lead II, III, and aVF.  In the ED, BNP elevated 455.  Creatinine elevated to 2.40, K 5.9. hsTn 33>31.  Chest x-ray showed no active disease.   COVID positive. Blood cultures grew E. Coli.   After being admitted on 7/9, patient was noted to be in significant respiratory distress with wheezing heard on physical exam.  Patient was started on Solu-Medrol  IV and levalbuterol .  He was started on IV Rocephin  and remdesivir  and infectious disease was consulted.  As patient had multiple hospitalizations for UTI/bacteremia, general surgery was consulted to consider robotic rectosigmoid resection and takedown of colovesical fistula while admitted.  They requested cardiology evaluate for preoperative recommendations  On 7/10, patient had a rapid response called for shortness of breath.  Patient was trying to get up to go to the bathroom when he got short of breath.  Heart rate increased to 130s.  On exam he had wheezing bilaterally.  He was started on BiPAP, given  60 mg Lasix , Solu-Medrol , neb treatment.  Chest x-ray showed lower lung volumes with increased patchy opacities, likely atelectasis.   Echocardiogram showed EF 25-30%, mild LVH, normal RV systolic function, mildly elevated PA systolic pressure, mild-moderate mitral valve  regurgitation, dilation of the ascending aorta measuring 41 mm.  On interview, patient reports that he feels wheezy and short of breath today.  He reportedly woke up feeling okay but his breathing has been getting worse throughout the day.  He has a dry, nonproductive cough.  Denies lower extremity swelling, orthopnea, abdominal distention.  He denies chest pain, palpitations, dizziness, near-syncope.  He is somewhat frustrated because he has been having a lot of hospital admissions over the past 6 months.  He reports that because of this, he has not been able to be very active and spends a lot of time in bed.  He was seen by our clinic in May and a stress test was recommended, he has not yet had this completed.  He denies having any chest pain prior to admission.  Has had some shortness of breath recently.  He is very eager to get his surgery as he is hoping this will prevent him from having further infections and hospital admissions.  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 History of GI bleeding (07/2023). Currently on ferrous sulfate  supplementation.   DIAGNOSTIC ASSESSMENT: ? Primary etiology (70-80% probability): Anemia of chronic disease/inflammation ? Secondary considerations: Iron deficiency component (elevated RD   DCM (dilated cardiomyopathy) (HCC)    nonischemic with normal coronary arteries at cath 06/2019.  EF 35 to 40% on echo 04/2021   Dementia (HCC)    Depression    Diarrhea  05/13/2023   E coli bacteremia 05/14/2023   High anion gap metabolic acidosis 07/13/2023   History of Clostridioides difficile colitis 08/15/2023   Associated with colovesical fistula? 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  Endoscopy esophageal stricture 2016     Septic shock from UTI 06/02/2023   Severe sepsis (HCC) 06/03/2023   SKIN CANCER, HX OF 05/10/2007   Facial left check and forehead and r shoulder  F/w derm   Syncope 05/13/2023   Thrombocytopenia (HCC) 06/02/2023   Lab Results      Component    Value    Date           PLT    301    07/19/2023           PLT    324    07/18/2023           PLT    268    07/16/2023           PLT    207    07/14/2023           PLT    228  07/13/2023             Lab Results      Component    Value    Date           ESRSEDRATE    60 (H)    05/08/2023     No results found for: CRP          Lab Results      Component    Value       Urinary catheter complication (HCC) 06/24/2023   Urinary dribbling 03/14/2022   Urinary incontinence 11/21/2022      Urinary Incontinence: They are wearing disposable diapers daily due to urinary incontinence, primarily nocturnal, and are currently on Tamsulosin  (Flomax ). We will refer them to Urology for further evaluation and potential treatment options and continue Tamsulosin  until they are seen by Urology.     Urinary tract infection without hematuria 06/02/2023   UTI (urinary tract infection) 07/01/2023    Past Surgical History:  Procedure Laterality Date   COLONOSCOPY     COLONOSCOPY N/A 12/05/2023   Procedure: COLONOSCOPY;  Surgeon: Leigh Elspeth SQUIBB, MD;  Location: [facility] ENDOSCOPY;  Service: Gastroenterology;  Laterality: N/A;   RIGHT/LEFT HEART CATH AND CORONARY ANGIOGRAPHY N/A 07/03/2019   Procedure: RIGHT/LEFT HEART CATH AND CORONARY  ANGIOGRAPHY;  Surgeon: Cherrie Toribio SAUNDERS, MD;  Location: [facility] INVASIVE CV LAB;  Service: Cardiovascular;  Laterality: N/A;     Scheduled Meds:  ARIPiprazole   2 mg Oral q AM   aspirin   324 mg Oral Once   budesonide  (PULMICORT ) nebulizer solution  0.25 mg Nebulization BID   busPIRone   10 mg Oral TID   carvedilol   12.5 mg Oral BID   Chlorhexidine  Gluconate Cloth  6 each Topical Daily   finasteride   5 mg Oral Daily   fluconazole   400 mg Oral Daily   folic acid   1 mg Oral Daily   [START ON 12/14/2023] furosemide   80 mg Intravenous Daily   gabapentin   100 mg Oral TID   guaiFENesin   600 mg Oral BID   heparin  injection (subcutaneous)  5,000 Units Subcutaneous Q8H   melatonin  3 mg Oral QHS   midodrine   5 mg Oral TID WC   mirtazapine   30 mg Oral QHS   pravastatin   20 mg Oral QHS   predniSONE   40 mg Oral Q breakfast   primidone   50 mg Oral TID   sodium chloride  flush  3 mL Intravenous Q12H   tamsulosin   0.4 mg Oral Daily   traZODone   100 mg Oral QHS   venlafaxine  XR  150 mg Oral Daily   Continuous Infusions:  cefTRIAXone  (ROCEPHIN )  IV 2 g (12/13/23 0503)   PRN Meds: acetaminophen  **OR** acetaminophen , haloperidol  lactate, levalbuterol   Allergies:    Allergies  Allergen Reactions   Albuterol  Palpitations    Supraventricular Tachycardia     Social History:   Social History   Socioeconomic History   Marital status: Married    Spouse name: Not on file   Number of children: Not on file   Years of education: Not on file   Highest education level: Not on file  Occupational History   Not on file  Tobacco Use   Smoking status: Never   Smokeless tobacco: Never  Vaping Use   Vaping status: Never Used  Substance and Sexual Activity   Alcohol use: Not Currently    Alcohol/week: 14.0 standard drinks of alcohol    Types: 14 Cans of beer per week  Drug use: No   Sexual activity: Not Currently  Other Topics Concern   Not on file  Social History Narrative   Not on file    Social Drivers of Health   Financial Resource Strain: Low Risk  (10/10/2023)   Overall Financial Resource Strain (CARDIA)    Difficulty of Paying Living Expenses: Not hard at all  Food Insecurity: No Food Insecurity (12/11/2023)   Hunger Vital Sign    Worried About Running Out of Food in the Last Year: Never true    Ran Out of Food in the Last Year: Never true  Transportation Needs: No Transportation Needs (12/11/2023)   PRAPARE - Administrator, Civil Service (Medical): No    Lack of Transportation (Non-Medical): No  Physical Activity: Sufficiently Active (10/10/2023)   Exercise Vital Sign    Days of Exercise per Week: 5 days    Minutes of Exercise per Session: 30 min  Stress: No Stress Concern Present (10/10/2023)   [employer] of Occupational Health - Occupational Stress Questionnaire    Feeling of Stress : Not at all  Social Connections: Socially Isolated (12/11/2023)   Social Connection and Isolation Panel    Frequency of Communication with Friends and Family: Never    Frequency of Social Gatherings with Friends and Family: Never    Attends Religious Services: Never    Database administrator or Organizations: No    Attends Banker Meetings: Never    Marital Status: Married  Catering manager Violence: Not At Risk (12/11/2023)   Humiliation, Afraid, Rape, and Kick questionnaire    Fear of Current or Ex-Partner: No    Emotionally Abused: No    Physically Abused: No    Sexually Abused: No    Family History:    Family History  Problem Relation Age of Onset   Hypertension Mother    Cancer Father    Bone cancer Sister      ROS:  Please see the history of present illness.   All other ROS reviewed and negative.     Physical Exam/Data: Vitals:   12/13/23 0400 12/13/23 0727 12/13/23 0926 12/13/23 1219  BP: 91/60 95/77 110/69 113/82  Pulse: (!) 57  62   Resp: 15 18    Temp: 98.4 F (36.9 C) 97.8 F (36.6 C)  97.7 F (36.5 C)  TempSrc: Axillary  Oral  Oral  SpO2: 100% 98%      Intake/Output Summary (Last 24 hours) at 12/13/2023 1300 Last data filed at 12/13/2023 1221 Gross per 24 hour  Intake --  Output 2600 ml  Net -2600 ml      12/05/2023    7:37 AM 12/02/2023    5:20 AM 11/30/2023    7:46 AM  Last 3 Weights  Weight (lbs) 238 lb 1.6 oz 238 lb 1.6 oz 240 lb  Weight (kg) 108 kg 108 kg 108.863 kg     There is no height or weight on file to calculate BMI.  General:  Ill appearing but in no acute distress. Sitting upright in the armchair  HEENT: normal Neck: no JVD Cardiac:  normal S1, S2; RRR; no murmur  Lungs:  Expiratory wheezing throughout. Normal WOB on room air  Abd: soft, nontender Ext: no edema in BLE  Musculoskeletal:  No deformities  Skin: warm and dry  Neuro:  No focal abnormalities noted Psych:  Normal affect   EKG:  The EKG was personally reviewed and demonstrates:  sinus tachycardia with HR 100  BPM, LBBB  Telemetry:  Telemetry was personally reviewed and demonstrates:  NSR, HR in the 80s-100   Relevant CV Studies: Cardiac Studies & Procedures   ______________________________________________________________________________________________ CARDIAC CATHETERIZATION  CARDIAC CATHETERIZATION 07/03/2019  Conclusion Findings:  Ao = 117/77 (92) LV =  112/13 RA =  7 RV = 28/9 PA = 28/12 (18) PCW = 11 Fick cardiac output/index = 6.0/2.4 PVR = 1.2 WU Ao sat = 99% PA sat = 69%, 71%  Assessment:  1. Normal coronaries 2. NICM with EF 25-30% 3. Normal hemodynamics  Plan/Discussion:  Medical therapy.  Suspect CM may be related to OSA. Hemodynamics surprisingly normal.  Toribio Fuel, MD 12:52 PM  Findings Coronary Findings Diagnostic  Dominance: Left  Left Main Vessel is angiographically normal.  Left Anterior Descending Vessel is angiographically normal.  Left Circumflex Vessel is angiographically normal.  Right Coronary Artery Vessel is angiographically normal.  Intervention  No  interventions have been documented.     ECHOCARDIOGRAM  ECHOCARDIOGRAM COMPLETE 12/13/2023  Narrative ECHOCARDIOGRAM REPORT    Patient Name:   CUSTER PIMENTA [facility] Date of Exam: 12/13/2023 Medical Rec #:  [id number]          Height:       72.0 in Accession #:    [id number]         Weight:       238.1 lb Date of Birth:  1950-11-22          BSA:          2.294 m Patient Age:    73 years           BP:           91/60 mmHg Patient Gender: M                  HR:           63 bpm. Exam Location:  Inpatient  Procedure: 2D Echo, Color Doppler and Cardiac Doppler (Both Spectral and Color Flow Doppler were utilized during procedure).  Indications:    Dyspnea  History:        Patient has prior history of Echocardiogram examinations, most recent 05/23/2023. Risk Factors:HDL.  Sonographer:    Benard Stallion Referring Phys: [id number] DONALDA M GHIMIRE  IMPRESSIONS   1. Left ventricular ejection fraction, by estimation, is 25 to 30%. The left ventricle has severely decreased function. The left ventricle demonstrates global hypokinesis. There is mild left ventricular hypertrophy. Left ventricular diastolic parameters are indeterminate. 2. Right ventricular systolic function is normal. The right ventricular size is normal. There is mildly elevated pulmonary artery systolic pressure. The estimated right ventricular systolic pressure is 43.9 mmHg. 3. The mitral valve is normal in structure. Mild to moderate mitral valve regurgitation. 4. The aortic valve is tricuspid. Aortic valve regurgitation is not visualized. No aortic stenosis is present. 5. Aortic dilatation noted. There is dilatation of the ascending aorta, measuring 41 mm. 6. The inferior vena cava is dilated in size with <50% respiratory variability, suggesting right atrial pressure of 15 mmHg.  FINDINGS Left Ventricle: Left ventricular ejection fraction, by estimation, is 25 to 30%. The left ventricle has severely decreased function. The  left ventricle demonstrates global hypokinesis. The left ventricular internal cavity size was normal in size. There is mild left ventricular hypertrophy. Left ventricular diastolic parameters are indeterminate.  Right Ventricle: The right ventricular size is normal. No increase in right ventricular wall thickness. Right ventricular systolic function is normal. There is mildly elevated pulmonary artery  systolic pressure. The tricuspid regurgitant velocity is 2.69 m/s, and with an assumed right atrial pressure of 15 mmHg, the estimated right ventricular systolic pressure is 43.9 mmHg.  Left Atrium: Left atrial size was normal in size.  Right Atrium: Right atrial size was normal in size.  Pericardium: There is no evidence of pericardial effusion.  Mitral Valve: The mitral valve is normal in structure. Mild to moderate mitral valve regurgitation.  Tricuspid Valve: The tricuspid valve is normal in structure. Tricuspid valve regurgitation is mild.  Aortic Valve: The aortic valve is tricuspid. Aortic valve regurgitation is not visualized. No aortic stenosis is present. Aortic valve mean gradient measures 2.0 mmHg. Aortic valve peak gradient measures 3.6 mmHg. Aortic valve area, by VTI measures 3.67 cm.  Pulmonic Valve: The pulmonic valve was not well visualized. Pulmonic valve regurgitation is trivial.  Aorta: The aortic root is normal in size and structure and aortic dilatation noted. There is dilatation of the ascending aorta, measuring 41 mm.  Venous: The inferior vena cava is dilated in size with less than 50% respiratory variability, suggesting right atrial pressure of 15 mmHg.  IAS/Shunts: The interatrial septum was not well visualized.   LEFT VENTRICLE PLAX 2D LVIDd:         5.40 cm   Diastology LVIDs:         4.90 cm   LV e' medial:  5.33 cm/s LV PW:         1.10 cm   LV e' lateral: 7.40 cm/s LV IVS:        1.10 cm LVOT diam:     2.20 cm LV SV:         76 LV SV Index:   33 LVOT  Area:     3.80 cm   RIGHT VENTRICLE RV Basal diam:  4.10 cm RV Mid diam:    3.90 cm RV S prime:     10.40 cm/s TAPSE (M-mode): 2.2 cm  LEFT ATRIUM           Index        RIGHT ATRIUM           Index LA diam:      4.50 cm 1.96 cm/m   RA Area:     22.80 cm LA Vol (A4C): 59.7 ml 26.02 ml/m  RA Volume:   64.10 ml  27.94 ml/m AORTIC VALVE AV Area (Vmax):    3.60 cm AV Area (Vmean):   3.41 cm AV Area (VTI):     3.67 cm AV Vmax:           94.50 cm/s AV Vmean:          65.900 cm/s AV VTI:            0.208 m AV Peak Grad:      3.6 mmHg AV Mean Grad:      2.0 mmHg LVOT Vmax:         89.60 cm/s LVOT Vmean:        59.200 cm/s LVOT VTI:          0.201 m LVOT/AV VTI ratio: 0.97  AORTA Ao Root diam: 3.50 cm Ao Asc diam:  4.10 cm  TRICUSPID VALVE TR Peak grad:   28.9 mmHg TR Vmax:        269.00 cm/s  SHUNTS Systemic VTI:  0.20 m Systemic Diam: 2.20 cm  Lonni Nanas MD Electronically signed by Lonni Nanas MD Signature Date/Time: 12/13/2023/11:00:03 AM    Final  MONITORS  LONG TERM MONITOR (3-14 DAYS) 07/10/2019  Narrative  Sinus bradycardia and Normal sinus rhythm with average heart rate 82bpm and ranged from42 to 160bpm.  SVT - probable atrial tachycardia with average heart rate 82bpm and ranged from 42-235bpm.  Occasional PVCs and PACs, bigeminal PVCs.  Wide complex tachycardia up to 5 beats.       ______________________________________________________________________________________________       Laboratory Data: High Sensitivity Troponin:   Recent Labs  Lab 12/11/23 1344 12/11/23 1834  TROPONINIHS 33* 31*     Chemistry Recent Labs  Lab 12/11/23 1344 12/11/23 1346 12/12/23 0636 12/13/23 0657  NA 136 135  134* 137 138  K 5.9* 5.8*  5.9* 5.2* 5.0  CL 102 102 104 106  CO2 21*  --  23 21*  GLUCOSE 113* 111* 166* 184*  BUN 25* 27* 32* 52*  CREATININE 2.40* 2.50* 2.48* 2.75*  CALCIUM  8.5*  --  8.5* 8.0*  GFRNONAA 27*  --   26* 23*  ANIONGAP 13  --  10 11    Recent Labs  Lab 12/11/23 1344  PROT 6.6  ALBUMIN  2.9*  AST 15  ALT 9  ALKPHOS 59  BILITOT 0.3   Lipids No results for input(s): CHOL, TRIG, HDL, LABVLDL, LDLCALC, CHOLHDL in the last 168 hours.  Hematology Recent Labs  Lab 12/11/23 1344 12/11/23 1346 12/11/23 1834 12/12/23 0636 12/13/23 0657  WBC 10.8*  --   --  9.1 23.5*  RBC 2.85*  --   --  2.87* 2.52*  HGB 8.2*   < > 8.6* 8.3* 7.2*  HCT 26.7*   < > 28.5* 26.9* 23.7*  MCV 93.7  --   --  93.7 94.0  MCH 28.8  --   --  28.9 28.6  MCHC 30.7  --   --  30.9 30.4  RDW 14.7  --   --  14.6 14.6  PLT 211  --   --  167 116*   < > = values in this interval not displayed.   Thyroid  No results for input(s): TSH, FREET4 in the last 168 hours.  BNP Recent Labs  Lab 12/11/23 1330 12/12/23 1754  BNP 455.8* 731.8*    DDimer  Recent Labs  Lab 12/13/23 0657  DDIMER 3.96*    Radiology/Studies:  ECHOCARDIOGRAM COMPLETE Result Date: 12/13/2023    ECHOCARDIOGRAM REPORT   Patient Name:   ELIAS DENNINGTON [facility] Date of Exam: 12/13/2023 Medical Rec #:  [id number]          Height:       72.0 in Accession #:    [id number]         Weight:       238.1 lb Date of Birth:  1950/11/04          BSA:          2.294 m Patient Age:    73 years           BP:           91/60 mmHg Patient Gender: M                  HR:           63 bpm. Exam Location:  Inpatient Procedure: 2D Echo, Color Doppler and Cardiac Doppler (Both Spectral and Color            Flow Doppler were utilized during procedure). Indications:    Dyspnea  History:  Patient has prior history of Echocardiogram examinations, most                 recent 05/23/2023. Risk Factors:HDL.  Sonographer:    Benard Stallion Referring Phys: [id number] DONALDA M GHIMIRE IMPRESSIONS  1. Left ventricular ejection fraction, by estimation, is 25 to 30%. The left ventricle has severely decreased function. The left ventricle demonstrates global hypokinesis. There  is mild left ventricular hypertrophy. Left ventricular diastolic parameters  are indeterminate.  2. Right ventricular systolic function is normal. The right ventricular size is normal. There is mildly elevated pulmonary artery systolic pressure. The estimated right ventricular systolic pressure is 43.9 mmHg.  3. The mitral valve is normal in structure. Mild to moderate mitral valve regurgitation.  4. The aortic valve is tricuspid. Aortic valve regurgitation is not visualized. No aortic stenosis is present.  5. Aortic dilatation noted. There is dilatation of the ascending aorta, measuring 41 mm.  6. The inferior vena cava is dilated in size with <50% respiratory variability, suggesting right atrial pressure of 15 mmHg. FINDINGS  Left Ventricle: Left ventricular ejection fraction, by estimation, is 25 to 30%. The left ventricle has severely decreased function. The left ventricle demonstrates global hypokinesis. The left ventricular internal cavity size was normal in size. There is mild left ventricular hypertrophy. Left ventricular diastolic parameters are indeterminate. Right Ventricle: The right ventricular size is normal. No increase in right ventricular wall thickness. Right ventricular systolic function is normal. There is mildly elevated pulmonary artery systolic pressure. The tricuspid regurgitant velocity is 2.69  m/s, and with an assumed right atrial pressure of 15 mmHg, the estimated right ventricular systolic pressure is 43.9 mmHg. Left Atrium: Left atrial size was normal in size. Right Atrium: Right atrial size was normal in size. Pericardium: There is no evidence of pericardial effusion. Mitral Valve: The mitral valve is normal in structure. Mild to moderate mitral valve regurgitation. Tricuspid Valve: The tricuspid valve is normal in structure. Tricuspid valve regurgitation is mild. Aortic Valve: The aortic valve is tricuspid. Aortic valve regurgitation is not visualized. No aortic stenosis is present.  Aortic valve mean gradient measures 2.0 mmHg. Aortic valve peak gradient measures 3.6 mmHg. Aortic valve area, by VTI measures 3.67 cm. Pulmonic Valve: The pulmonic valve was not well visualized. Pulmonic valve regurgitation is trivial. Aorta: The aortic root is normal in size and structure and aortic dilatation noted. There is dilatation of the ascending aorta, measuring 41 mm. Venous: The inferior vena cava is dilated in size with less than 50% respiratory variability, suggesting right atrial pressure of 15 mmHg. IAS/Shunts: The interatrial septum was not well visualized.  LEFT VENTRICLE PLAX 2D LVIDd:         5.40 cm   Diastology LVIDs:         4.90 cm   LV e' medial:  5.33 cm/s LV PW:         1.10 cm   LV e' lateral: 7.40 cm/s LV IVS:        1.10 cm LVOT diam:     2.20 cm LV SV:         76 LV SV Index:   33 LVOT Area:     3.80 cm  RIGHT VENTRICLE RV Basal diam:  4.10 cm RV Mid diam:    3.90 cm RV S prime:     10.40 cm/s TAPSE (M-mode): 2.2 cm LEFT ATRIUM           Index  RIGHT ATRIUM           Index LA diam:      4.50 cm 1.96 cm/m   RA Area:     22.80 cm LA Vol (A4C): 59.7 ml 26.02 ml/m  RA Volume:   64.10 ml  27.94 ml/m  AORTIC VALVE AV Area (Vmax):    3.60 cm AV Area (Vmean):   3.41 cm AV Area (VTI):     3.67 cm AV Vmax:           94.50 cm/s AV Vmean:          65.900 cm/s AV VTI:            0.208 m AV Peak Grad:      3.6 mmHg AV Mean Grad:      2.0 mmHg LVOT Vmax:         89.60 cm/s LVOT Vmean:        59.200 cm/s LVOT VTI:          0.201 m LVOT/AV VTI ratio: 0.97  AORTA Ao Root diam: 3.50 cm Ao Asc diam:  4.10 cm TRICUSPID VALVE TR Peak grad:   28.9 mmHg TR Vmax:        269.00 cm/s  SHUNTS Systemic VTI:  0.20 m Systemic Diam: 2.20 cm Lonni Nanas MD Electronically signed by Lonni Nanas MD Signature Date/Time: 12/13/2023/11:00:03 AM    Final    DG Chest Port 1 View Result Date: 12/12/2023 CLINICAL DATA:  Shortness of breath. EXAM: PORTABLE CHEST 1 VIEW COMPARISON:  Radiographs  12/12/2023 and 12/11/2023.  CT 03/16/2008. FINDINGS: 1648 hours. There are lower lung volumes. The heart size and mediastinal contours are stable. Increased patchy opacities at both lung bases, likely atelectasis. No confluent airspace disease, definite edema, pneumothorax or significant pleural effusion. The bones appear unchanged. IMPRESSION: Lower lung volumes with increased patchy opacities at both lung bases, likely atelectasis. Electronically Signed   By: Elsie Perone M.D.   On: 12/12/2023 16:59   DG Chest Port 1V same Day Result Date: 12/12/2023 CLINICAL DATA:  Shortness of breath EXAM: PORTABLE CHEST 1 VIEW COMPARISON:  12/11/2023 FINDINGS: Single frontal view of the chest demonstrates borderline enlargement the cardiac silhouette. No airspace disease, effusion, or pneumothorax. No acute bony abnormalities. IMPRESSION: 1. Borderline enlargement of the cardiac silhouette. 2. No acute airspace disease. Electronically Signed   By: Ozell Daring M.D.   On: 12/12/2023 09:32   DG CHEST PORT 1 VIEW Result Date: 12/11/2023 CLINICAL DATA:  [id number] SOB (shortness of breath) [id number] EXAM: PORTABLE CHEST 1 VIEW COMPARISON:  AP radiograph the chest dated December 11, 2023. FINDINGS: The heart is normal in size. Pulmonary vasculature is unremarkable. There is a calcified granuloma in the right midlung zone. The lungs appear clear otherwise. IMPRESSION: No active disease. Electronically Signed   By: Evalene Coho M.D.   On: 12/11/2023 21:04   DG Chest Port 1 View Result Date: 12/11/2023 CLINICAL DATA:  Questionable sepsis - evaluate for abnormality. Tachypnea. EXAM: PORTABLE CHEST 1 VIEW COMPARISON:  07/12/2023. FINDINGS: Stable calcified granuloma overlying the right lower lung zone, laterally. Bilateral lung fields are clear. No acute consolidation or lung collapse. Bilateral costophrenic angles are clear. Stable cardio-mediastinal silhouette. No acute osseous abnormalities. The soft tissues are within normal  limits. IMPRESSION: No active disease. Electronically Signed   By: Ree Molt M.D.   On: 12/11/2023 13:08     Assessment and Plan:  Chronic HFrEF  - EF previously EF 25-30%. Cath at  that time showed normal coronary arteries. Treated with GDMT, EF improved to 50-55% in 05/2023. GDMT was held earlier this year due to low BP  - Now, patient admitted with COVID, severe sepsis. Echocardiogram this admission showed EF 25-30%. Mild LVH, normal RV systolic function, mildly elevated PA systolic pressure, mild-moderate MR - Patient has had episodes of respiratory distress this admission. Was given IV lasix  yesterday and this AM. Put out 2.6 L urine yesterday, creatinine bumped from 2.50>2.75  - Patient is euvolemic on exam. Stop lasix  with bump in creatinine  - Continue carvedilol  12.5 mg BID  - GDMT limited by renal function   AKI on CKD stage IIIa - Baseline creatinine around 1.2-1.5. 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 Cardiology asked to consult for preoperative clearance  - Patient currently admitted with sepsis, COVID19 infection. He is anemic with hemoglobin 7.2 and had AKI with creatinine 2.75. These factors increase his surgical risk.  - Patient denies chest pain. Although he has low activity levels, he had a cath in 2021 that showed normal coronary arteries. He does not need repeat ischemic evaluation prior to surgery. OK to cancel stress test that was previously ordered and proceed with surgery once optimized from a medical standpoint   Otherwise per primary  - Severe sepsis secondary to polymicrobial bloodstream infection (candidemia on 6/18, E.coli bacteremia 7/9) - COVID 19 infection  - AKI on CKD stage IIIa - Hyperkalemia - resolved   Risk Assessment/Risk Scores:  [place]  Heart Association (NYHA) Functional Class NYHA Class II     For questions or updates, please contact  [facility] Please consult [email or web address] for contact info under    Signed, Rollo FABIENE Louder, PA-C  12/13/2023 1:00 PM  Patient seen, examined. Available data reviewed. Agree with findings, assessment, and plan as outlined by JORETTA Louder, PA-C.  Patient's extensive medical history reviewed.  He is personally examined and interviewed today.  On my exam, he is an elderly appearing male in no acute distress.  HEENT is normal, JVP is normal, carotid upstrokes are normal without bruits, lungs have bilateral wheezing and no crackles.  Heart is regular rate and rhythm with no murmur or gallop.  Abdomen is soft and nontender, lower extremities have no edema, skin is warm and dry with no rash.  Cardiology is asked to see this gentleman for preoperative clearance.  He has a nonischemic cardiomyopathy with LVEF that is been highly variable over the last 4 years.  In 2021 he had an LVEF of 25% and he underwent cardiac catheterization demonstrating angiographically normal coronary arteries.  His LVEF subsequently improved on medical therapy, but on his echocardiogram today he was again demonstrated to have an LVEF of 25 to 30% with severe global hypokinesis, normal RV function, mildly elevated PA pressure at 44 mmHg, and mild to moderate mitral regurgitation with no significant aortic valve disease.  The patient has a colovesical fistula which is caused multiple issues with bacteremia including recent infections with candidemia and E. coli bacteremia.  He has been in and out of the hospital.  He has been seen by general surgery who is tentatively planning an operation once he is cleared by cardiology and from an infectious standpoint with his active COVID infection.  There had been plans to perform an outpatient stress test because of the patient's low functional capacity.  I do not think that risk stratification  with stress testing is going to add any important information here.  The patient underwent  cardiac catheterization at age 34 (4 years ago) and had normal coronary arteries at that time.  The chance that he has developed multivessel coronary artery disease in the past 4 years causing his cardiomyopathy is exceedingly unlikely.  While his respiratory failure may be multifactorial, with a component of heart failure, I am not currently impressed that he has evidence of any volume overload.  He has no peripheral edema or JVD.  His chest x-ray does not show evidence of pulmonary edema.  His BUN and creatinine are trending up with diuresis.  In summary, I think the patient is at moderate risk of cardiovascular complications with surgical repair of his colovesical fistula due to severe LV dysfunction.  However, I do not feel that further preoperative imaging for risk stratification will be of any clinical value.  Since he has a serious problem that can only be corrected with surgery, I would recommend proceeding as soon as it is safe with respect to his COVID infection.  I would recommend holding furosemide  until his renal indices stabilized/improved.  Continue carvedilol  at the current dose.  Otherwise no further cardiac recommendations at this time.  Please let us  know if he has any perioperative cardiac issues and we would be happy to help with his care.  Ozell Fell, M.D. 12/13/2023 5:41 PM

## 2023-12-13 NOTE — Evaluation (Signed)
 Occupational Therapy Evaluation Patient Details Name: Calvin Escobar MRN: [id number] DOB: 06-17-1950 Today's Date: 12/13/2023   History of Present Illness   73 y.o. male presents to [facility] on 7/9 with tachycardia, tachypnea and cough. + COVID and ecoli bacteremia. PMH: recent admission for hematuria--went home with foley catheter, hyperlipidemia, systolic heart failure, history of colovesical fistula urinary retention anemia of chronic disease, bipolar disorder, dementia.     Clinical Impressions Pt is typically independent in self care and mobility. He makes simple meals when his family is not home. His wife manages IADLs. Pt presents with generalized weakness and impaired standing balance requiring up to min (hand held assist) for ambulation and set up to min assist for ADLs. Pt with stable VS on RA. Pt is likely to progress well. Recommend HHOT, pt may progress and not require.    If plan is discharge home, recommend the following:   A little help with walking and/or transfers;A little help with bathing/dressing/bathroom;Assistance with cooking/housework;Assist for transportation;Help with stairs or ramp for entrance;Direct supervision/assist for medications management;Direct supervision/assist for financial management     Functional Status Assessment   Patient has had a recent decline in their functional status and demonstrates the ability to make significant improvements in function in a reasonable and predictable amount of time.     Equipment Recommendations   None recommended by OT (defer bathroom equipment to [facility])     Recommendations for Other Services         Precautions/Restrictions   Precautions Precautions: Fall Precaution/Restrictions Comments: indwelling urinary catheter Restrictions Weight Bearing Restrictions Per Provider Order: No     Mobility Bed Mobility Overal bed mobility: Needs Assistance Bed Mobility: Supine to Sit     Supine to sit:  Contact guard          Transfers Overall transfer level: Needs assistance Equipment used: None Transfers: Sit to/from Stand Sit to Stand: Contact guard assist           General transfer comment: cues to stand momentarily as this is his first time up      Balance Overall balance assessment: Needs assistance Sitting-balance support: Feet supported Sitting balance-Leahy Scale: Good Sitting balance - Comments: reaches to floor to don shoes without LOB   Standing balance support: During functional activity, No upper extremity supported, Bilateral upper extremity supported Standing balance-Leahy Scale: Poor Standing balance comment: unilateral stabilization                           ADL either performed or assessed with clinical judgement   ADL Overall ADL's : Needs assistance/impaired Eating/Feeding: Independent;Sitting   Grooming: Set up;Sitting   Upper Body Bathing: Supervision/ safety;Sitting   Lower Body Bathing: Minimal assistance;Sit to/from stand   Upper Body Dressing : Sitting;Set up   Lower Body Dressing: Minimal assistance;Sitting/lateral leans Lower Body Dressing Details (indicate cue type and reason): for shoes Toilet Transfer: Minimal assistance;Ambulation           Functional mobility during ADLs: Minimal assistance       Vision Baseline Vision/History: 1 Wears glasses Ability to See in Adequate Light: 0 Adequate Patient Visual Report: No change from baseline       Perception         Praxis         Pertinent Vitals/Pain Pain Assessment Pain Assessment: No/denies pain     Extremity/Trunk Assessment Upper Extremity Assessment Upper Extremity Assessment: Overall WFL for tasks assessed   Lower  Extremity Assessment Lower Extremity Assessment: Defer to PT evaluation   Cervical / Trunk Assessment Cervical / Trunk Assessment: Kyphotic   Communication Communication Communication: No apparent difficulties   Cognition  Arousal: Alert Behavior During Therapy: Flat affect Cognition: History of cognitive impairments             OT - Cognition Comments: per chart, pt with dementia                 Following commands: Intact       Cueing  General Comments   Cueing Techniques: Verbal cues      Exercises     Shoulder Instructions      Home Living Family/patient expects to be discharged to:: Private residence Living Arrangements: Spouse/significant other;Children Available Help at Discharge: Family;Available PRN/intermittently (family works opposite shifts) Type of Home: House Home Access: Stairs to enter [employer] of Steps: 8-9 Entrance Stairs-Rails: None Home Layout: Two level;Bed/bath upstairs Alternate Level Stairs-Number of Steps: flight Alternate Level Stairs-Rails: Right;Left Bathroom Shower/Tub: Producer, television/film/video: Standard Bathroom Accessibility: Yes   Home Equipment: Agricultural consultant (2 wheels)   Additional Comments: Pt information was from chart but pt also reported as well. However the only thing different pt reported no walker at home.      Prior Functioning/Environment Prior Level of Function : Independent/Modified Independent;Patient poor historian/Family not available             Mobility Comments: ind without AD, drives ADLs Comments: spouse does meds management, finances, pt makes himself hot dogs when his family is not there, independent in self care    OT Problem List: Impaired balance (sitting and/or standing);Decreased strength;Decreased knowledge of use of DME or AE   OT Treatment/Interventions: Self-care/ADL training;Balance training;Therapeutic activities;DME and/or AE instruction;Patient/family education      OT Goals(Current goals can be found in the care plan section)   Acute Rehab OT Goals OT Goal Formulation: With patient Time For Goal Achievement: 12/27/23 Potential to Achieve Goals: Good ADL Goals Pt Will  Perform Grooming: standing;with modified independence Pt Will Perform Lower Body Bathing: with modified independence;sit to/from stand Pt Will Perform Lower Body Dressing: with modified independence;sit to/from stand Pt Will Transfer to Toilet: with modified independence;ambulating Pt Will Perform Toileting - Clothing Manipulation and hygiene: with modified independence;sit to/from stand   OT Frequency:  Min 2X/week    Co-evaluation              AM-PAC OT 6 Clicks Daily Activity     Outcome Measure Help from another person eating meals?: None Help from another person taking care of personal grooming?: A Little Help from another person toileting, which includes using toliet, bedpan, or urinal?: A Little Help from another person bathing (including washing, rinsing, drying)?: A Little Help from another person to put on and taking off regular upper body clothing?: A Little Help from another person to put on and taking off regular lower body clothing?: A Little 6 Click Score: 19   End of Session Equipment Utilized During Treatment: Gait belt Nurse Communication: Mobility status  Activity Tolerance: Patient tolerated treatment well Patient left: in chair;with call bell/phone within reach;with chair alarm set  OT Visit Diagnosis: Muscle weakness (generalized) (M62.81);Other abnormalities of gait and mobility (R26.89)                Time: 8896-8868 OT Time Calculation (min): 28 min Charges:  OT General Charges $OT Visit: 1 Visit OT Evaluation $OT Eval Moderate Complexity: 1  Mod Mliss HERO, OTR/L Acute Rehabilitation Services Office: [phone]   Kennth Mliss Helling 12/13/2023, 11:47 AM

## 2023-12-13 NOTE — Progress Notes (Signed)
      INFECTIOUS DISEASE ATTENDING ADDENDUM:   Date: 12/13/2023  Patient name: Calvin Escobar [facility]  Medical record number: [id number]  Date of birth: [birth year]    Appreciate Dr. Sheldon seeing the patient and working hard to facilitate colectomy and takedown of colovesicular fistula.  Patient still needs cardiac clearance and to be in the cleared from his COVID-19 infection.  Continue fluconazole  for his candidemia and ceftriaxone  for the E. coli.  My partner Dr. Dennise is available this weekend for questions and we will follow-up on his culture data she will take over the service on Monday.   Jomarie Fleeta Rothman 12/13/2023, 5:08 PM

## 2023-12-13 NOTE — Plan of Care (Signed)
  Problem: Education: Goal: Knowledge of risk factors and measures for prevention of condition will improve Outcome: Progressing   Problem: Respiratory: Goal: Will maintain a patent airway Outcome: Progressing   Problem: Education: Goal: Knowledge of General Education information will improve Description: Including pain rating scale, medication(s)/side effects and non-pharmacologic comfort measures Outcome: Progressing   Problem: Clinical Measurements: Goal: Diagnostic test results will improve Outcome: Progressing Goal: Respiratory complications will improve Outcome: Progressing   Problem: Activity: Goal: Risk for activity intolerance will decrease Outcome: Progressing   Problem: Nutrition: Goal: Adequate nutrition will be maintained Outcome: Progressing   Problem: Coping: Goal: Level of anxiety will decrease Outcome: Progressing   Problem: Pain Managment: Goal: General experience of comfort will improve and/or be controlled Outcome: Progressing

## 2023-12-13 NOTE — Progress Notes (Signed)
*  PRELIMINARY RESULTS* Echocardiogram 2D Echocardiogram has been performed.  Calvin Escobar Stallion 12/13/2023, 10:25 AM

## 2023-12-13 NOTE — Progress Notes (Signed)
 PROGRESS NOTE        PATIENT DETAILS Name: Calvin Escobar Age: 73 y.o. Sex: male Date of Birth: February 24, 1951 Admit Date: 12/11/2023 Admitting Physician Maximino DELENA Sharps, MD ERE:[id number], [name], MD  Brief Summary: Patient is a 73 y.o.  male with history of colovesical fistula-numerous recent hospitalizations for sepsis related to UTI/bacteremia-just discharged from this facility on 7/5 for candidemia on oral Diflucan -presented to the hospital with fever-found to have COVID-19 infection and E. coli bacteremia.  Significant events: 7/9>> admit to [facility]-fever-COVID-19 infection/E. coli bacteremia.  Post admission-had an episode of respiratory distress/bronchospasm-started on BiPAP-transfer to progressive care. 7/10>> liberated off BiPAP-on room air-another episode of respiratory distress later in the afternoon-on BiPAP-given Lasix /steroids/bronchodilators. 7/11>> taken off BiPAP this morning-on 1-2 L of oxygen.  Calm/comfortable-not in any distress.  Significant studies: 6/30>> CT abdomen/pelvis: Marked bladder wall thickening with surrounding edema/fluid-suspected tract from sigmoid colon to posterior bladder.  Significant microbiology data: 7/9>> COVID PCR: Positive 7/9>> influenza /RSV PCR: Negative. 7/9>> blood gram culture: E Coli  Procedures: 7/3>> colonoscopy-diverticulosis, severe luminal narrowing distal sigmoid  Consults: ID  Subjective: Liberated off BiPAP earlier this morning-lying flat-not in any distress.  Acknowledges having several weeks history of exertional dyspnea-spouse claims that he has to sit down for like 5 minutes to catch his breath before patient continue  physical activity.  Anxiety significantly worse when he is short of breath.  Objective: Vitals: Blood pressure 95/77, pulse (!) 57, temperature 97.8 F (36.6 C), temperature source Oral, resp. rate 18, SpO2 98%.   Exam: Gen Exam:Alert awake-not in any  distress HEENT:atraumatic, normocephalic Chest: B/L clear to auscultation anteriorly CVS:S1S2 regular Abdomen:soft non tender, non distended Extremities:no edema Neurology: Non focal Skin: no rash  Pertinent Labs/Radiology:    Latest Ref Rng & Units 12/13/2023    6:57 AM 12/12/2023    6:36 AM 12/11/2023    6:34 PM  CBC  WBC 4.0 - 10.5 K/uL 23.5  9.1    Hemoglobin 13.0 - 17.0 g/dL 7.2  8.3  8.6   Hematocrit 39.0 - 52.0 % 23.7  26.9  28.5   Platelets 150 - 400 K/uL 116  167      Lab Results  Component Value Date   NA 137 12/12/2023   K 5.2 (H) 12/12/2023   CL 104 12/12/2023   CO2 23 12/12/2023      Assessment/Plan: Severe sepsis secondary to polymicrobial bloodstream infection (candidemia on 6/18 and E. coli bacteremia 7/9)-present on admission (fever/tachypnea/tachycardia/leukocytosis with evidence of AKI) Sepsis physiology has resolved-leukocytosis is likely from steroid. Polymicrobial bloodstream infection-likely secondary to symptomatic chronic colovesical fistula Already on fluconazole  from prior admission Remains on IV Rocephin  ID consulted [facility] following-patient needs cardiology evaluation prior to definite surgery.  Patient has had numerous recurrent hospitalizations for UTI/bacteremia since December 2024 all likely due to symptomatic colovesical fistula.  Cardiology has been consulted 7/11.  COVID-19 infection Some cough-no pneumonia on CXR x 3.   He has had episodes of respiratory distress-worsened by anxiety for the past 2 days requiring BiPAP however CXR continues to be negative.  Suspect these episodes are either bronchospasm or more likely possibly related to CHF (per spouse-patient has had more exertional dyspnea in the past several weeks). Suspect within stop Solu-Medrol  and just transition him to a few more days of prednisone  Continue some bronchodilators for now. Suspected elevated CRP is  likely a sequelae of bacteremia/candidemia/colovesical fistula rather than  COVID-19 pneumonitis. D-dimer is mildly elevated-but do not think he has VTE-however he is already on prophylactic Lovenox .  Will check lower extremity Dopplers given COVID-19 infection.  AKI on CKD stage IIIa AKI likely hemodynamic mediated in the setting of sepsis-possible CHF. No longer on IV fluids-received Lasix  yesterday Awaiting repeat labs this morning. Avoid nephrotoxic agents Recent CT abdomen 6/30-without any major hydronephrosis. Follow electrolytes  Hyperkalemia Due to AKI Mild Received Lokelma  yesterday-and Lasix -awaiting repeat labs this morning.    Acute respiratory distress secondary to Acute on chronic diastolic heart failure For the past 2 days-patient has had episodes of respiratory distress-suspect this is probably related to CHF-per history obtained from patient and then from spouse-for the past several weeks-patient has had gradually worsening exertional dyspnea.  He does have trace leg edema.  Per spouse-patient needs around 5 minutes to catch his breath before he is able to continue on with physical activity. He does have some CKD with AKI-plan is to continue with diuretics-and watch her renal function closely. Echo pending. Keep BiPAP at bedside-as needed and scheduled nightly for several more days.  Normocytic anemia Due to combination of acute illness/ongoing CKD Follow CBC  History of colovesical fistula Likely cause of recurrent infections/bacteremia/candidemia S/p recent colonoscopy-with sigmoid stricture-suspected to be the site of fistula. [facility] consulted today-see above-  History of BPH History of urethral stricture-s/p dilated by Dr. Alvaro on 6/28-with chronic indwelling Foley catheter Recommendations by urology (consult note on 6/28) were to keep Foley catheter in place-monthly changes recommended.  History of mood disorder Stable Abilify /BuSpar /trazodone /Effexor   History of intention tremor Primidone .  Class 1 Obesity: Estimated body mass  index is 32.29 kg/m as calculated from the following:   Height as of 12/05/23: 6' (1.829 m).   Weight as of 12/05/23: 108 kg.   Code status:   Code Status: Do not attempt resuscitation (DNR) PRE-ARREST INTERVENTIONS DESIRED   DVT Prophylaxis: heparin  injection 5,000 Units Start: 12/12/23 1400 SCDs Start: 12/11/23 1741   Family Communication: Spouse-[name] [phone] updated 7/10   Disposition Plan: Status is: Inpatient Remains inpatient appropriate because: Severity of illness   Planned Discharge Destination:Home health   Diet: Diet Order             Diet Heart Room service appropriate? Yes; Fluid consistency: Thin  Diet effective now                     Antimicrobial agents: Anti-infectives (From admission, onward)    Start     Dose/Rate Route Frequency Ordered Stop   12/12/23 1000  fluconazole  (DIFLUCAN ) tablet 400 mg        400 mg Oral Daily 12/11/23 1742     12/12/23 1000  remdesivir  100 mg in sodium chloride  0.9 % 100 mL IVPB       Placed in Followed by Linked Group   100 mg 200 mL/hr over 30 Minutes Intravenous Daily 12/11/23 1852 12/14/23 0959   12/12/23 0400  cefTRIAXone  (ROCEPHIN ) 2 g in sodium chloride  0.9 % 100 mL IVPB        2 g 200 mL/hr over 30 Minutes Intravenous Every 24 hours 12/12/23 0337     12/11/23 2000  remdesivir  200 mg in sodium chloride  0.9% 250 mL IVPB       Placed in Followed by Linked Group   200 mg 580 mL/hr over 30 Minutes Intravenous Once 12/11/23 1852 12/11/23 2201   12/11/23 1245  ceFEPIme  (MAXIPIME )  2 g in sodium chloride  0.9 % 100 mL IVPB        2 g 200 mL/hr over 30 Minutes Intravenous  Once 12/11/23 1230 12/11/23 1534   12/11/23 1245  metroNIDAZOLE  (FLAGYL ) IVPB 500 mg  Status:  Discontinued        500 mg 100 mL/hr over 60 Minutes Intravenous  Once 12/11/23 1230 12/11/23 1517   12/11/23 1245  vancomycin  (VANCOCIN ) IVPB 1000 mg/200 mL premix  Status:  Discontinued        1,000 mg 200 mL/hr over 60 Minutes  Intravenous  Once 12/11/23 1230 12/11/23 1230   12/11/23 1245  vancomycin  (VANCOREADY) IVPB 2000 mg/400 mL  Status:  Discontinued        2,000 mg 200 mL/hr over 120 Minutes Intravenous  Once 12/11/23 1230 12/11/23 1517        MEDICATIONS: Scheduled Meds:  ARIPiprazole   2 mg Oral q AM   aspirin   324 mg Oral Once   budesonide  (PULMICORT ) nebulizer solution  0.25 mg Nebulization BID   busPIRone   10 mg Oral TID   carvedilol   12.5 mg Oral BID   Chlorhexidine  Gluconate Cloth  6 each Topical Daily   finasteride   5 mg Oral Daily   fluconazole   400 mg Oral Daily   folic acid   1 mg Oral Daily   furosemide   80 mg Intravenous BID   gabapentin   100 mg Oral TID   guaiFENesin   600 mg Oral BID   heparin  injection (subcutaneous)  5,000 Units Subcutaneous Q8H   levalbuterol   0.63 mg Nebulization TID   melatonin  3 mg Oral QHS   methylPREDNISolone  (SOLU-MEDROL ) injection  125 mg Intravenous Daily   midodrine   5 mg Oral TID WC   mirtazapine   30 mg Oral QHS   pravastatin   20 mg Oral QHS   primidone   50 mg Oral TID   sodium chloride  flush  3 mL Intravenous Q12H   sodium zirconium cyclosilicate   5 g Oral BID   tamsulosin   0.4 mg Oral Daily   traZODone   100 mg Oral QHS   venlafaxine  XR  150 mg Oral Daily   Continuous Infusions:  cefTRIAXone  (ROCEPHIN )  IV 2 g (12/13/23 0503)   remdesivir  100 mg in sodium chloride  0.9 % 100 mL IVPB 100 mg (12/12/23 0832)   PRN Meds:.acetaminophen  **OR** acetaminophen , haloperidol  lactate, levalbuterol    I have personally reviewed following labs and imaging studies  LABORATORY DATA: CBC: Recent Labs  Lab 12/11/23 1344 12/11/23 1346 12/11/23 1834 12/12/23 0636 12/13/23 0657  WBC 10.8*  --   --  9.1 23.5*  NEUTROABS 8.2*  --   --   --   --   HGB 8.2* 8.2*  8.5* 8.6* 8.3* 7.2*  HCT 26.7* 24.0*  25.0* 28.5* 26.9* 23.7*  MCV 93.7  --   --  93.7 94.0  PLT 211  --   --  167 116*    Basic Metabolic Panel: Recent Labs  Lab 12/11/23 1344  12/11/23 1346 12/12/23 0636  NA 136 135  134* 137  K 5.9* 5.8*  5.9* 5.2*  CL 102 102 104  CO2 21*  --  23  GLUCOSE 113* 111* 166*  BUN 25* 27* 32*  CREATININE 2.40* 2.50* 2.48*  CALCIUM  8.5*  --  8.5*    GFR: Estimated Creatinine Clearance: 32.7 mL/min (A) (by C-G formula based on SCr of 2.48 mg/dL (H)).  Liver Function Tests: Recent Labs  Lab 12/11/23 1344  AST 15  ALT  9  ALKPHOS 59  BILITOT 0.3  PROT 6.6  ALBUMIN  2.9*   No results for input(s): LIPASE, AMYLASE in the last 168 hours. No results for input(s): AMMONIA in the last 168 hours.  Coagulation Profile: Recent Labs  Lab 12/11/23 1344  INR 1.2    Cardiac Enzymes: No results for input(s): CKTOTAL, CKMB, CKMBINDEX, TROPONINI in the last 168 hours.  BNP (last 3 results) No results for input(s): PROBNP in the last 8760 hours.  Lipid Profile: No results for input(s): CHOL, HDL, LDLCALC, TRIG, CHOLHDL, LDLDIRECT in the last 72 hours.  Thyroid  Function Tests: No results for input(s): TSH, T4TOTAL, FREET4, T3FREE, THYROIDAB in the last 72 hours.  Anemia Panel: No results for input(s): VITAMINB12, FOLATE, FERRITIN, TIBC, IRON, RETICCTPCT in the last 72 hours.  Urine analysis:    Component Value Date/Time   COLORURINE RED (A) 12/02/2023 0532   APPEARANCEUR TURBID (A) 12/02/2023 0532   LABSPEC  12/02/2023 0532    TEST NOT REPORTED DUE TO COLOR INTERFERENCE OF URINE PIGMENT   PHURINE  12/02/2023 0532    TEST NOT REPORTED DUE TO COLOR INTERFERENCE OF URINE PIGMENT   GLUCOSEU (A) 12/02/2023 0532    TEST NOT REPORTED DUE TO COLOR INTERFERENCE OF URINE PIGMENT   HGBUR (A) 12/02/2023 0532    TEST NOT REPORTED DUE TO COLOR INTERFERENCE OF URINE PIGMENT   BILIRUBINUR (A) 12/02/2023 0532    TEST NOT REPORTED DUE TO COLOR INTERFERENCE OF URINE PIGMENT   KETONESUR (A) 12/02/2023 0532    TEST NOT REPORTED DUE TO COLOR INTERFERENCE OF URINE PIGMENT   PROTEINUR  (A) 12/02/2023 0532    TEST NOT REPORTED DUE TO COLOR INTERFERENCE OF URINE PIGMENT   UROBILINOGEN 0.2 09/12/2013 1446   NITRITE (A) 12/02/2023 0532    TEST NOT REPORTED DUE TO COLOR INTERFERENCE OF URINE PIGMENT   LEUKOCYTESUR (A) 12/02/2023 0532    TEST NOT REPORTED DUE TO COLOR INTERFERENCE OF URINE PIGMENT    Sepsis Labs: Lactic Acid, Venous    Component Value Date/Time   LATICACIDVEN 1.2 12/11/2023 1347    MICROBIOLOGY: Recent Results (from the past 240 hours)  Resp panel by RT-PCR (RSV, Flu A&B, Covid) Anterior Nasal Swab     Status: Abnormal   Collection Time: 12/11/23 12:29 PM   Specimen: Anterior Nasal Swab  Result Value Ref Range Status   SARS Coronavirus 2 by RT PCR POSITIVE (A) NEGATIVE Final   Influenza A by PCR NEGATIVE NEGATIVE Final   Influenza B by PCR NEGATIVE NEGATIVE Final    Comment: (NOTE) The Xpert Xpress SARS-CoV-2/FLU/RSV plus assay is intended as an aid in the diagnosis of influenza from Nasopharyngeal swab specimens and should not be used as a sole basis for treatment. Nasal washings and aspirates are unacceptable for Xpert Xpress SARS-CoV-2/FLU/RSV testing.  Fact Sheet for Patients: BloggerCourse.com  Fact Sheet for Healthcare Providers: SeriousBroker.it  This test is not yet approved or cleared by the [place]  FDA and has been authorized for detection and/or diagnosis of SARS-CoV-2 by FDA under an Emergency Use Authorization (EUA). This EUA will remain in effect (meaning this test can be used) for the duration of the COVID-19 declaration under Section 564(b)(1) of the Act, 21 U.S.C. section 360bbb-3(b)(1), unless the authorization is terminated or revoked.     Resp Syncytial Virus by PCR NEGATIVE NEGATIVE Final    Comment: (NOTE) Fact Sheet for Patients: BloggerCourse.com  Fact Sheet for Healthcare Providers: SeriousBroker.it  This  test is not yet approved or  cleared by the [place]  FDA and has been authorized for detection and/or diagnosis of SARS-CoV-2 by FDA under an Emergency Use Authorization (EUA). This EUA will remain in effect (meaning this test can be used) for the duration of the COVID-19 declaration under Section 564(b)(1) of the Act, 21 U.S.C. section 360bbb-3(b)(1), unless the authorization is terminated or revoked.  Performed at [facility], [address], [place], [place] [address]   Blood Culture (routine x 2)     Status: Abnormal (Preliminary result)   Collection Time: 12/11/23  1:30 PM   Specimen: BLOOD RIGHT FOREARM  Result Value Ref Range Status   Specimen Description BLOOD RIGHT FOREARM  Final   Special Requests   Final    BOTTLES DRAWN AEROBIC AND ANAEROBIC Blood Culture results may not be optimal due to an inadequate volume of blood received in culture bottles   Culture  Setup Time   Final    GRAM NEGATIVE RODS IN BOTH AEROBIC AND ANAEROBIC BOTTLES CRITICAL RESULT CALLED TO, READ BACK BY AND VERIFIED WITH: PHARMD JAMES LEDFORD [id number] BY J RAZZAK, MT    Culture (A)  Final    ESCHERICHIA COLI SUSCEPTIBILITIES TO FOLLOW Performed at [facility], [address], [place], [place] [address]    Report Status PENDING  Incomplete  Blood Culture ID Panel (Reflexed)     Status: Abnormal   Collection Time: 12/11/23  1:30 PM  Result Value Ref Range Status   Enterococcus faecalis NOT DETECTED NOT DETECTED Final   Enterococcus Faecium NOT DETECTED NOT DETECTED Final   Listeria monocytogenes NOT DETECTED NOT DETECTED Final   Staphylococcus species NOT DETECTED NOT DETECTED Final   Staphylococcus aureus (BCID) NOT DETECTED NOT DETECTED Final   Staphylococcus epidermidis NOT DETECTED NOT DETECTED Final   Staphylococcus lugdunensis NOT DETECTED NOT DETECTED Final   Streptococcus species NOT DETECTED NOT DETECTED Final   Streptococcus agalactiae NOT DETECTED NOT DETECTED  Final   Streptococcus pneumoniae NOT DETECTED NOT DETECTED Final   Streptococcus pyogenes NOT DETECTED NOT DETECTED Final   A.calcoaceticus-baumannii NOT DETECTED NOT DETECTED Final   Bacteroides fragilis NOT DETECTED NOT DETECTED Final   Enterobacterales DETECTED (A) NOT DETECTED Final    Comment: Enterobacterales represent a large order of gram negative bacteria, not a single organism. CRITICAL RESULT CALLED TO, READ BACK BY AND VERIFIED WITH: PHARMD JAMES LEDFORD [id number] BY J RAZZAK, MT    Enterobacter cloacae complex NOT DETECTED NOT DETECTED Final   Escherichia coli DETECTED (A) NOT DETECTED Final    Comment: CRITICAL RESULT CALLED TO, READ BACK BY AND VERIFIED WITH: PHARMD JAMES LEDFORD [phone] [id number] BY J RAZZAK, MT    Klebsiella aerogenes NOT DETECTED NOT DETECTED Final   Klebsiella oxytoca NOT DETECTED NOT DETECTED Final   Klebsiella pneumoniae NOT DETECTED NOT DETECTED Final   Proteus species NOT DETECTED NOT DETECTED Final   Salmonella species NOT DETECTED NOT DETECTED Final   Serratia marcescens NOT DETECTED NOT DETECTED Final   Haemophilus influenzae NOT DETECTED NOT DETECTED Final   Neisseria meningitidis NOT DETECTED NOT DETECTED Final   Pseudomonas aeruginosa NOT DETECTED NOT DETECTED Final   Stenotrophomonas maltophilia NOT DETECTED NOT DETECTED Final   Candida albicans NOT DETECTED NOT DETECTED Final   Candida auris NOT DETECTED NOT DETECTED Final   Candida glabrata NOT DETECTED NOT DETECTED Final   Candida krusei NOT DETECTED NOT DETECTED Final   Candida parapsilosis NOT DETECTED NOT DETECTED Final   Candida tropicalis NOT DETECTED  NOT DETECTED Final   Cryptococcus neoformans/gattii NOT DETECTED NOT DETECTED Final   CTX-M ESBL NOT DETECTED NOT DETECTED Final   Carbapenem resistance IMP NOT DETECTED NOT DETECTED Final   Carbapenem resistance KPC NOT DETECTED NOT DETECTED Final   Carbapenem resistance NDM NOT DETECTED NOT DETECTED Final   Carbapenem resist  OXA 48 LIKE NOT DETECTED NOT DETECTED Final   Carbapenem resistance VIM NOT DETECTED NOT DETECTED Final    Comment: Performed at [facility], [address] [place], [place] [address]  Blood Culture (routine x 2)     Status: None (Preliminary result)   Collection Time: 12/11/23  6:30 PM   Specimen: BLOOD  Result Value Ref Range Status   Specimen Description BLOOD SITE NOT SPECIFIED  Final   Special Requests   Final    BOTTLES DRAWN AEROBIC AND ANAEROBIC Blood Culture adequate volume   Culture   Final    NO GROWTH < 12 HOURS Performed at [facility], [address], [place], [place] [address]    Report Status PENDING  Incomplete    RADIOLOGY STUDIES/RESULTS: DG Chest Port 1 View Result Date: 12/12/2023 CLINICAL DATA:  Shortness of breath. EXAM: PORTABLE CHEST 1 VIEW COMPARISON:  Radiographs 12/12/2023 and 12/11/2023.  CT 03/16/2008. FINDINGS: 1648 hours. There are lower lung volumes. The heart size and mediastinal contours are stable. Increased patchy opacities at both lung bases, likely atelectasis. No confluent airspace disease, definite edema, pneumothorax or significant pleural effusion. The bones appear unchanged. IMPRESSION: Lower lung volumes with increased patchy opacities at both lung bases, likely atelectasis. Electronically Signed   By: Elsie Perone M.D.   On: 12/12/2023 16:59   DG Chest Port 1V same Day Result Date: 12/12/2023 CLINICAL DATA:  Shortness of breath EXAM: PORTABLE CHEST 1 VIEW COMPARISON:  12/11/2023 FINDINGS: Single frontal view of the chest demonstrates borderline enlargement the cardiac silhouette. No airspace disease, effusion, or pneumothorax. No acute bony abnormalities. IMPRESSION: 1. Borderline enlargement of the cardiac silhouette. 2. No acute airspace disease. Electronically Signed   By: Ozell Daring M.D.   On: 12/12/2023 09:32   DG CHEST PORT 1 VIEW Result Date: 12/11/2023 CLINICAL DATA:  [id number] SOB (shortness of breath) [id number] EXAM:  PORTABLE CHEST 1 VIEW COMPARISON:  AP radiograph the chest dated December 11, 2023. FINDINGS: The heart is normal in size. Pulmonary vasculature is unremarkable. There is a calcified granuloma in the right midlung zone. The lungs appear clear otherwise. IMPRESSION: No active disease. Electronically Signed   By: Evalene Coho M.D.   On: 12/11/2023 21:04   DG Chest Port 1 View Result Date: 12/11/2023 CLINICAL DATA:  Questionable sepsis - evaluate for abnormality. Tachypnea. EXAM: PORTABLE CHEST 1 VIEW COMPARISON:  07/12/2023. FINDINGS: Stable calcified granuloma overlying the right lower lung zone, laterally. Bilateral lung fields are clear. No acute consolidation or lung collapse. Bilateral costophrenic angles are clear. Stable cardio-mediastinal silhouette. No acute osseous abnormalities. The soft tissues are within normal limits. IMPRESSION: No active disease. Electronically Signed   By: Ree Molt M.D.   On: 12/11/2023 13:08     LOS: 2 days   Donalda Applebaum, MD  [facility]    To contact the attending provider between 7A-7P or the covering provider during after hours 7P-7A, please log into the web site [email or web address] and access using universal [place] password for that web site. If you do not have the password, please call the hospital operator.  12/13/2023, 8:56 AM

## 2023-12-13 NOTE — Progress Notes (Signed)
 12/13/2023  Calvin Escobar [id number] 11/03/1950  CARE TEAM: PCP: [name], MD  Outpatient Care Team: Patient Care Team: [name], MD as PCP - General (Internal Medicine) [name], MD as PCP - Cardiology (Cardiology) [name], MD (Dermatology) [facility] (Behavioral Health) Ezzard Rolin BIRCH, LCSW as VBCI Care Management (Licensed Clinical Social Worker) [name] (Psychiatry) [name], [name] LABOR, MD (Psychiatry) Legrand Victory LITTIE MOULD, MD as Consulting Physician (Gastroenterology) Sheldon Standing, MD as Consulting Physician (Colon and Rectal Surgery) Ezzard Rolin BIRCH KEN (Licensed Clinical Social Worker) Carolee Sherwood BIRCH MOULD, MD as Consulting Physician (Urology) Daneen Damien BROCKS, NP as Nurse Practitioner (Cardiology)  Inpatient Treatment Team: Treatment Team:  Raenelle Donalda HERO, MD Bobbette Lights, MD Mae Corean BROCKS, RN Ccs, Md, MD Sheldon Standing, MD Nicholaus Boyer, RN Cristopher Duwaine SAILOR, [place] Cal Standing FALCON, RN Kennth Mliss LITTIE, OT   Problem List:   Principal Problem:   COVID-19 Active Problems:   Dementia [facility])   Colovesical fistula   HLD (hyperlipidemia)   Hyperkalemia   Anemia of chronic disease   Acute kidney injury superimposed on chronic kidney disease (HCC)   Hypocalcemia   Candidemia (HCC)   BPH with urinary obstruction   Anxiety   Bipolar disorder (HCC)   SIRS (systemic inflammatory response syndrome) (HCC)   Leukocytosis   * No surgery found *      Assessment [facility] Stay = 2 days)      Readmission with COVID positivity.  Some hypoxia felt more consistent with cardiopulmonary failure.  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    Plan:  Updated by surgical hospitalist inpatient team.  Discussed with [facility] internal medicine primary provider, Dr. Kellie  At some point patient would benefit from sigmoid colectomy and takedown of colovesical  fistula.  Possible ostomy.  Been working on this since April:  Colonoscopy done earlier this month.  Cardiac clearance not complete yet.  I think they have been working on a stress test nuclear medicine study.    See if cardiology can expedite this either as an inpatient or close outpatient.  I discussed tentatively with anesthesia.  Usually they wish to wait on general anesthesia if the the patient's COVID-positive.  Ideally at least 10 days from when they are asymptomatic.  That is hard to tease out.  Do not know if his mild hypoxia is more cardiac pulmonary issues than true COVID issues.  If this is going to take a while, he may need a diverting colostomy proximal to the colovesical fistula just to calm things down and regroup for more definitive resection.  I think the likelihood of needing at least a temporary diverting loop ileostomy versus permanent colostomy is higher given his readmissions and deconditioned state.  Surgery will help follow once we get a sense that the patient is cleared from a cardiopulmonary and COVID standpoint.  Not going operate until it is safe from pulmonary & cardiac & anesthesia standpoint  -monitor electrolytes & replace as needed  Keep K>4, Mg>2, Phos>3  -VTE prophylaxis- SCDs.  Anticoagulation prophyllaxis SQ as appropriate  -mobilize as tolerated to help recovery.  Enlist therapies in moderate/high risk patients as appropriate  I updated the patient's status to the patient  Recommendations were made.  Questions were answered.  He expressed understanding & appreciation.      I reviewed nursing notes, ED provider notes, hospitalist notes, last 24 h vitals and pain scores, last 48 h  intake and output, last 24 h labs and trends, and last 24 h imaging results.  I have reviewed this patient's available data, including medical history, events of note, test results, etc as part of my evaluation.   A significant portion of that time was spent in  counseling. Care during the described time interval was provided by me.  This care required high  level of medical decision making.  12/13/2023    Subjective: (Chief complaint)  Notified of patient's readmission.  Patient using BiPAP which he typically does at night.  Breathing better.  One-to-one sitter in room since he had had some confusion.  She notes no events overnight.  Patient denies much abdominal pain.  Objective:  Vital signs:  Vitals:   12/12/23 2113 12/12/23 2316 12/13/23 0346 12/13/23 0400  BP: 104/69   91/60  Pulse:  85 (!) 59 (!) 57  Resp:  (!) 22 18 15   Temp: 98.4 F (36.9 C)   98.4 F (36.9 C)  TempSrc: Oral   Axillary  SpO2:  98%  100%    Last BM Date : 12/11/23  Intake/Output   Yesterday:  07/10 0701 - 07/11 0700 In: -  Out: 2600 [Urine:2600] This shift:  Total I/O In: -  Out: 2250 [Urine:2250]  Bowel function:  Flatus: YES  BM:  No  Drain:    Physical Exam:  General: Pt awake/alert in no acute distress Eyes: PERRL, normal EOM.  Sclera clear.  No icterus Neuro: CN II-XII intact w/o focal sensory/motor deficits. Lymph: No head/neck/groin lymphadenopathy Psych:  No delerium/psychosis/paranoia.  Oriented x 4 HENT: Normocephalic, Mucus membranes moist.  No thrush Neck: Supple, No tracheal deviation.  No obvious thyromegaly Chest: No pain to chest wall compression.  Good respiratory excursion.  No audible wheezing CV:  Pulses intact.  Regular rhythm.  No major extremity edema MS: Normal AROM mjr joints.  No obvious deformity  Abdomen: Soft.  Nondistended.  Nontender.  No evidence of peritonitis.  No incarcerated hernias.  Ext:  No deformity.  No mjr edema.  No cyanosis Skin: No petechiae / purpurea.  No major sores.  Warm and dry    Results:   Cultures: Recent Results (from the past 720 hours)  Fungus culture, blood     Status: Abnormal   Collection Time: 11/20/23  1:28 PM   Specimen: Blood   BLD  Result Value Ref Range  Status   Fungus (Mycology) Culture Final report (A)  Final   Fungal result 1 Candida albicans (A)  Final    Comment: Heavy growth  Yeast Susceptibilities     Status: None   Collection Time: 11/20/23  1:28 PM   BLD  Result Value Ref Range Status   Organism ID, Yeast Candida albicans  Final   Amphotericin B MIC 0.5 ug/mL  Final   Anidulafungin MIC Comment  Final    Comment: 0.06 ug/mL Susceptible   Caspofungin MIC Comment  Final    Comment: 0.12 ug/mL Susceptible   Fluconazole  Islt MIC 0.5 ug/mL Susceptible  Final   ISAVUCONAZOLE MIC 0.008 ug/mL or less  Final   Itraconazole MIC 0.12 ug/mL  Final   Micafungin  MIC 0.015 ug/mL  Final    Comment: Susceptible   Posaconazole MIC 0.06 ug/mL  Final   REZAFUNGIN MIC Comment  Final    Comment: 0.06 ug/mL Susceptible   Voriconazole MIC 0.015 ug/mL  Final    Comment: Susceptible  Urine Culture     Status: None   Collection Time:  11/20/23  2:28 PM   Specimen: Urine  Result Value Ref Range Status   MICRO NUMBER: [id number]  Final   SPECIMEN QUALITY: Adequate  Final   Sample Source URINE  Final   STATUS: FINAL  Final   Result: No Growth  Final  Blood culture (routine x 2)     Status: None   Collection Time: 12/02/23  7:40 AM   Specimen: BLOOD LEFT ARM  Result Value Ref Range Status   Specimen Description BLOOD LEFT ARM  Final   Special Requests   Final    BOTTLES DRAWN AEROBIC AND ANAEROBIC Blood Culture results may not be optimal due to an inadequate volume of blood received in culture bottles   Culture   Final    NO GROWTH 5 DAYS Performed at [facility], [address] [place], [place] [address]    Report Status 12/07/2023 FINAL  Final  Blood culture (routine x 2)     Status: None   Collection Time: 12/02/23  7:41 AM   Specimen: BLOOD  Result Value Ref Range Status   Specimen Description BLOOD RIGHT ANTECUBITAL  Final   Special Requests   Final    BOTTLES DRAWN AEROBIC AND ANAEROBIC Blood Culture adequate volume   Culture    Final    NO GROWTH 5 DAYS Performed at [facility], [address], [place], [place] [address]    Report Status 12/07/2023 FINAL  Final  Fungus culture, blood     Status: None   Collection Time: 12/02/23  2:08 PM   Specimen: BLOOD  Result Value Ref Range Status   Specimen Description BLOOD SITE NOT SPECIFIED  Final   Special Requests   Final    BOTTLES DRAWN AEROBIC ONLY Blood Culture adequate volume   Culture   Final    NO GROWTH 7 DAYS NO FUNGUS ISOLATED Performed at [facility], [address], [place], [place] [address]    Report Status 12/09/2023 FINAL  Final  Resp panel by RT-PCR (RSV, Flu A&B, Covid) Anterior Nasal Swab     Status: Abnormal   Collection Time: 12/11/23 12:29 PM   Specimen: Anterior Nasal Swab  Result Value Ref Range Status   SARS Coronavirus 2 by RT PCR POSITIVE (A) NEGATIVE Final   Influenza A by PCR NEGATIVE NEGATIVE Final   Influenza B by PCR NEGATIVE NEGATIVE Final    Comment: (NOTE) The Xpert Xpress SARS-CoV-2/FLU/RSV plus assay is intended as an aid in the diagnosis of influenza from Nasopharyngeal swab specimens and should not be used as a sole basis for treatment. Nasal washings and aspirates are unacceptable for Xpert Xpress SARS-CoV-2/FLU/RSV testing.  Fact Sheet for Patients: BloggerCourse.com  Fact Sheet for Healthcare Providers: SeriousBroker.it  This test is not yet approved or cleared by the [place]  FDA and has been authorized for detection and/or diagnosis of SARS-CoV-2 by FDA under an Emergency Use Authorization (EUA). This EUA will remain in effect (meaning this test can be used) for the duration of the COVID-19 declaration under Section 564(b)(1) of the Act, 21 U.S.C. section 360bbb-3(b)(1), unless the authorization is terminated or revoked.     Resp Syncytial Virus by PCR NEGATIVE NEGATIVE Final    Comment: (NOTE) Fact Sheet for  Patients: BloggerCourse.com  Fact Sheet for Healthcare Providers: SeriousBroker.it  This test is not yet approved or cleared by the [place]  FDA and has been authorized for detection and/or diagnosis of SARS-CoV-2 by FDA under an Emergency Use  Authorization (EUA). This EUA will remain in effect (meaning this test can be used) for the duration of the COVID-19 declaration under Section 564(b)(1) of the Act, 21 U.S.C. section 360bbb-3(b)(1), unless the authorization is terminated or revoked.  Performed at [facility], [address] [place], [place] [address]   Blood Culture (routine x 2)     Status: None (Preliminary result)   Collection Time: 12/11/23  1:30 PM   Specimen: BLOOD RIGHT FOREARM  Result Value Ref Range Status   Specimen Description BLOOD RIGHT FOREARM  Final   Special Requests   Final    BOTTLES DRAWN AEROBIC AND ANAEROBIC Blood Culture results may not be optimal due to an inadequate volume of blood received in culture bottles   Culture  Setup Time   Final    GRAM NEGATIVE RODS IN BOTH AEROBIC AND ANAEROBIC BOTTLES CRITICAL RESULT CALLED TO, READ BACK BY AND VERIFIED WITH: MAYA AGENT LEDFORD [id number] BY JINNY COMMON, MT Performed at [facility], [address], [place], [place] [address]    Culture GRAM NEGATIVE RODS  Final   Report Status PENDING  Incomplete  Blood Culture ID Panel (Reflexed)     Status: Abnormal   Collection Time: 12/11/23  1:30 PM  Result Value Ref Range Status   Enterococcus faecalis NOT DETECTED NOT DETECTED Final   Enterococcus Faecium NOT DETECTED NOT DETECTED Final   Listeria monocytogenes NOT DETECTED NOT DETECTED Final   Staphylococcus species NOT DETECTED NOT DETECTED Final   Staphylococcus aureus (BCID) NOT DETECTED NOT DETECTED Final   Staphylococcus epidermidis NOT DETECTED NOT DETECTED Final   Staphylococcus lugdunensis NOT DETECTED NOT DETECTED Final    Streptococcus species NOT DETECTED NOT DETECTED Final   Streptococcus agalactiae NOT DETECTED NOT DETECTED Final   Streptococcus pneumoniae NOT DETECTED NOT DETECTED Final   Streptococcus pyogenes NOT DETECTED NOT DETECTED Final   A.calcoaceticus-baumannii NOT DETECTED NOT DETECTED Final   Bacteroides fragilis NOT DETECTED NOT DETECTED Final   Enterobacterales DETECTED (A) NOT DETECTED Final    Comment: Enterobacterales represent a large order of gram negative bacteria, not a single organism. CRITICAL RESULT CALLED TO, READ BACK BY AND VERIFIED WITH: PHARMD JAMES LEDFORD [id number] BY J RAZZAK, MT    Enterobacter cloacae complex NOT DETECTED NOT DETECTED Final   Escherichia coli DETECTED (A) NOT DETECTED Final    Comment: CRITICAL RESULT CALLED TO, READ BACK BY AND VERIFIED WITH: PHARMD JAMES LEDFORD [phone] [id number] BY J RAZZAK, MT    Klebsiella aerogenes NOT DETECTED NOT DETECTED Final   Klebsiella oxytoca NOT DETECTED NOT DETECTED Final   Klebsiella pneumoniae NOT DETECTED NOT DETECTED Final   Proteus species NOT DETECTED NOT DETECTED Final   Salmonella species NOT DETECTED NOT DETECTED Final   Serratia marcescens NOT DETECTED NOT DETECTED Final   Haemophilus influenzae NOT DETECTED NOT DETECTED Final   Neisseria meningitidis NOT DETECTED NOT DETECTED Final   Pseudomonas aeruginosa NOT DETECTED NOT DETECTED Final   Stenotrophomonas maltophilia NOT DETECTED NOT DETECTED Final   Candida albicans NOT DETECTED NOT DETECTED Final   Candida auris NOT DETECTED NOT DETECTED Final   Candida glabrata NOT DETECTED NOT DETECTED Final   Candida krusei NOT DETECTED NOT DETECTED Final   Candida parapsilosis NOT DETECTED NOT DETECTED Final   Candida tropicalis NOT DETECTED NOT DETECTED Final   Cryptococcus neoformans/gattii NOT DETECTED NOT DETECTED Final   CTX-M ESBL NOT DETECTED NOT DETECTED Final   Carbapenem resistance IMP NOT DETECTED NOT  DETECTED Final   Carbapenem resistance KPC NOT  DETECTED NOT DETECTED Final   Carbapenem resistance NDM NOT DETECTED NOT DETECTED Final   Carbapenem resist OXA 48 LIKE NOT DETECTED NOT DETECTED Final   Carbapenem resistance VIM NOT DETECTED NOT DETECTED Final    Comment: Performed at [facility], [address] [place], [place] [address]  Blood Culture (routine x 2)     Status: None (Preliminary result)   Collection Time: 12/11/23  6:30 PM   Specimen: BLOOD  Result Value Ref Range Status   Specimen Description BLOOD SITE NOT SPECIFIED  Final   Special Requests   Final    BOTTLES DRAWN AEROBIC AND ANAEROBIC Blood Culture adequate volume   Culture   Final    NO GROWTH < 12 HOURS Performed at [facility], [address] [place], [place] [address]    Report Status PENDING  Incomplete    Labs: Results for orders placed or performed during the hospital encounter of 12/11/23 (from the past 48 hours)  Resp panel by RT-PCR (RSV, Flu A&B, Covid) Anterior Nasal Swab     Status: Abnormal   Collection Time: 12/11/23 12:29 PM   Specimen: Anterior Nasal Swab  Result Value Ref Range   SARS Coronavirus 2 by RT PCR POSITIVE (A) NEGATIVE   Influenza A by PCR NEGATIVE NEGATIVE   Influenza B by PCR NEGATIVE NEGATIVE    Comment: (NOTE) The Xpert Xpress SARS-CoV-2/FLU/RSV plus assay is intended as an aid in the diagnosis of influenza from Nasopharyngeal swab specimens and should not be used as a sole basis for treatment. Nasal washings and aspirates are unacceptable for Xpert Xpress SARS-CoV-2/FLU/RSV testing.  Fact Sheet for Patients: BloggerCourse.com  Fact Sheet for Healthcare Providers: SeriousBroker.it  This test is not yet approved or cleared by the [place]  FDA and has been authorized for detection and/or diagnosis of SARS-CoV-2 by FDA under an Emergency Use Authorization (EUA). This EUA will remain in effect (meaning this test can be used) for the duration of  the COVID-19 declaration under Section 564(b)(1) of the Act, 21 U.S.C. section 360bbb-3(b)(1), unless the authorization is terminated or revoked.     Resp Syncytial Virus by PCR NEGATIVE NEGATIVE    Comment: (NOTE) Fact Sheet for Patients: BloggerCourse.com  Fact Sheet for Healthcare Providers: SeriousBroker.it  This test is not yet approved or cleared by the [place]  FDA and has been authorized for detection and/or diagnosis of SARS-CoV-2 by FDA under an Emergency Use Authorization (EUA). This EUA will remain in effect (meaning this test can be used) for the duration of the COVID-19 declaration under Section 564(b)(1) of the Act, 21 U.S.C. section 360bbb-3(b)(1), unless the authorization is terminated or revoked.  Performed at [facility], [address], [place], [place] [address]   Blood Culture (routine x 2)     Status: None (Preliminary result)   Collection Time: 12/11/23  1:30 PM   Specimen: BLOOD RIGHT FOREARM  Result Value Ref Range   Specimen Description BLOOD RIGHT FOREARM    Special Requests      BOTTLES DRAWN AEROBIC AND ANAEROBIC Blood Culture results may not be optimal due to an inadequate volume of blood received in culture bottles   Culture  Setup Time      GRAM NEGATIVE RODS IN BOTH AEROBIC AND ANAEROBIC BOTTLES CRITICAL RESULT CALLED TO, READ BACK BY AND VERIFIED WITH: MAYA AGENT LEDFORD [id number] BY JINNY COMMON, MT Performed at [facility],  [address] [place], [place] [address]    Culture GRAM NEGATIVE RODS    Report Status PENDING   Brain natriuretic peptide     Status: Abnormal   Collection Time: 12/11/23  1:30 PM  Result Value Ref Range   B Natriuretic Peptide 455.8 (H) 0.0 - 100.0 pg/mL    Comment: Performed at [facility], [address], [place], [place] [address]  Blood Culture ID Panel (Reflexed)     Status: Abnormal   Collection Time: 12/11/23  1:30 PM  Result  Value Ref Range   Enterococcus faecalis NOT DETECTED NOT DETECTED   Enterococcus Faecium NOT DETECTED NOT DETECTED   Listeria monocytogenes NOT DETECTED NOT DETECTED   Staphylococcus species NOT DETECTED NOT DETECTED   Staphylococcus aureus (BCID) NOT DETECTED NOT DETECTED   Staphylococcus epidermidis NOT DETECTED NOT DETECTED   Staphylococcus lugdunensis NOT DETECTED NOT DETECTED   Streptococcus species NOT DETECTED NOT DETECTED   Streptococcus agalactiae NOT DETECTED NOT DETECTED   Streptococcus pneumoniae NOT DETECTED NOT DETECTED   Streptococcus pyogenes NOT DETECTED NOT DETECTED   A.calcoaceticus-baumannii NOT DETECTED NOT DETECTED   Bacteroides fragilis NOT DETECTED NOT DETECTED   Enterobacterales DETECTED (A) NOT DETECTED    Comment: Enterobacterales represent a large order of gram negative bacteria, not a single organism. CRITICAL RESULT CALLED TO, READ BACK BY AND VERIFIED WITH: PHARMD JAMES LEDFORD [phone] [phone] BY J RAZZAK, MT    Enterobacter cloacae complex NOT DETECTED NOT DETECTED   Escherichia coli DETECTED (A) NOT DETECTED    Comment: CRITICAL RESULT CALLED TO, READ BACK BY AND VERIFIED WITH: PHARMD JAMES LEDFORD [phone] [phone] BY J RAZZAK, MT    Klebsiella aerogenes NOT DETECTED NOT DETECTED   Klebsiella oxytoca NOT DETECTED NOT DETECTED   Klebsiella pneumoniae NOT DETECTED NOT DETECTED   Proteus species NOT DETECTED NOT DETECTED   Salmonella species NOT DETECTED NOT DETECTED   Serratia marcescens NOT DETECTED NOT DETECTED   Haemophilus influenzae NOT DETECTED NOT DETECTED   Neisseria meningitidis NOT DETECTED NOT DETECTED   Pseudomonas aeruginosa NOT DETECTED NOT DETECTED   Stenotrophomonas maltophilia NOT DETECTED NOT DETECTED   Candida albicans NOT DETECTED NOT DETECTED   Candida auris NOT DETECTED NOT DETECTED   Candida glabrata NOT DETECTED NOT DETECTED   Candida krusei NOT DETECTED NOT DETECTED   Candida parapsilosis NOT DETECTED NOT DETECTED   Candida  tropicalis NOT DETECTED NOT DETECTED   Cryptococcus neoformans/gattii NOT DETECTED NOT DETECTED   CTX-M ESBL NOT DETECTED NOT DETECTED   Carbapenem resistance IMP NOT DETECTED NOT DETECTED   Carbapenem resistance KPC NOT DETECTED NOT DETECTED   Carbapenem resistance NDM NOT DETECTED NOT DETECTED   Carbapenem resist OXA 48 LIKE NOT DETECTED NOT DETECTED   Carbapenem resistance VIM NOT DETECTED NOT DETECTED    Comment: Performed at [facility], [address] [place], [place] [address]  Comprehensive metabolic panel     Status: Abnormal   Collection Time: 12/11/23  1:44 PM  Result Value Ref Range   Sodium 136 135 - 145 mmol/L   Potassium 5.9 (H) 3.5 - 5.1 mmol/L   Chloride 102 98 - 111 mmol/L   CO2 21 (L) 22 - 32 mmol/L   Glucose, Bld 113 (H) 70 - 99 mg/dL    Comment: Glucose reference range applies only to samples taken after fasting for at least 8 hours.   BUN 25 (H) 8 - 23 mg/dL   Creatinine, Ser 7.59 (H) 0.61 - 1.24  mg/dL   Calcium  8.5 (L) 8.9 - 10.3 mg/dL   Total Protein 6.6 6.5 - 8.1 g/dL   Albumin  2.9 (L) 3.5 - 5.0 g/dL   AST 15 15 - 41 U/L   ALT 9 0 - 44 U/L   Alkaline Phosphatase 59 38 - 126 U/L   Total Bilirubin 0.3 0.0 - 1.2 mg/dL   GFR, Estimated 27 (L) >60 mL/min    Comment: (NOTE) Calculated using the CKD-EPI Creatinine Equation (2021)    Anion gap 13 5 - 15    Comment: Performed at [facility], [address], [place], [place] [address]  CBC with Differential     Status: Abnormal   Collection Time: 12/11/23  1:44 PM  Result Value Ref Range   WBC 10.8 (H) 4.0 - 10.5 K/uL   RBC 2.85 (L) 4.22 - 5.81 MIL/uL   Hemoglobin 8.2 (L) 13.0 - 17.0 g/dL   HCT 73.2 (L) 60.9 - 47.9 %   MCV 93.7 80.0 - 100.0 fL   MCH 28.8 26.0 - 34.0 pg   MCHC 30.7 30.0 - 36.0 g/dL   RDW 85.2 88.4 - 84.4 %   Platelets 211 150 - 400 K/uL   nRBC 0.0 0.0 - 0.2 %   Neutrophils Relative % 75 %   Neutro Abs 8.2 (H) 1.7 - 7.7 K/uL   Lymphocytes Relative 7 %   Lymphs Abs 0.8 0.7 -  4.0 K/uL   Monocytes Relative 15 %   Monocytes Absolute 1.6 (H) 0.1 - 1.0 K/uL   Eosinophils Relative 1 %   Eosinophils Absolute 0.1 0.0 - 0.5 K/uL   Basophils Relative 1 %   Basophils Absolute 0.1 0.0 - 0.1 K/uL   Immature Granulocytes 1 %   Abs Immature Granulocytes 0.08 (H) 0.00 - 0.07 K/uL    Comment: Performed at [facility], [address], [place], [place] [address]  Protime-INR     Status: Abnormal   Collection Time: 12/11/23  1:44 PM  Result Value Ref Range   Prothrombin Time 15.7 (H) 11.4 - 15.2 seconds   INR 1.2 0.8 - 1.2    Comment: (NOTE) INR goal varies based on device and disease states. Performed at [facility], [address] [place], [place] [address]   Troponin I (High Sensitivity)     Status: Abnormal   Collection Time: 12/11/23  1:44 PM  Result Value Ref Range   Troponin I (High Sensitivity) 33 (H) <18 ng/L    Comment: (NOTE) Elevated high sensitivity troponin I (hsTnI) values and significant  changes across serial measurements may suggest ACS but many other  chronic and acute conditions are known to elevate hsTnI results.  Refer to the Links section for chest pain algorithms and additional  guidance. Performed at [facility], [address], [place], [place] [address]   I-Stat Chem 8, ED     Status: Abnormal   Collection Time: 12/11/23  1:46 PM  Result Value Ref Range   Sodium 134 (L) 135 - 145 mmol/L   Potassium 5.9 (H) 3.5 - 5.1 mmol/L   Chloride 102 98 - 111 mmol/L   BUN 27 (H) 8 - 23 mg/dL   Creatinine, Ser 7.49 (H) 0.61 - 1.24 mg/dL   Glucose, Bld 888 (H) 70 - 99 mg/dL    Comment: Glucose reference range applies only to samples taken after fasting for at least 8 hours.   Calcium , Ion 1.12 (L) 1.15 - 1.40 mmol/L  TCO2 22 22 - 32 mmol/L   Hemoglobin 8.5 (L) 13.0 - 17.0 g/dL   HCT 74.9 (L) 60.9 - 47.9 %  I-Stat venous blood gas, ED ([facility])     Status: Abnormal   Collection Time: 12/11/23  1:46 PM  Result Value Ref  Range   pH, Ven 7.352 7.25 - 7.43   pCO2, Ven 41.9 (L) 44 - 60 mmHg   pO2, Ven 85 (H) 32 - 45 mmHg   Bicarbonate 23.2 20.0 - 28.0 mmol/L   TCO2 25 22 - 32 mmol/L   O2 Saturation 96 %   Acid-base deficit 2.0 0.0 - 2.0 mmol/L   Sodium 135 135 - 145 mmol/L   Potassium 5.8 (H) 3.5 - 5.1 mmol/L   Calcium , Ion 1.12 (L) 1.15 - 1.40 mmol/L   HCT 24.0 (L) 39.0 - 52.0 %   Hemoglobin 8.2 (L) 13.0 - 17.0 g/dL   Sample type VENOUS   I-Stat Lactic Acid, ED     Status: None   Collection Time: 12/11/23  1:47 PM  Result Value Ref Range   Lactic Acid, Venous 1.2 0.5 - 1.9 mmol/L  Type and screen [facility]     Status: None   Collection Time: 12/11/23  6:29 PM  Result Value Ref Range   ABO/RH(D) O POS    Antibody Screen NEG    Sample Expiration      12/14/2023,2359 Performed at [facility] [facility], [address] [place], [place] [address]   Blood Culture (routine x 2)     Status: None (Preliminary result)   Collection Time: 12/11/23  6:30 PM   Specimen: BLOOD  Result Value Ref Range   Specimen Description BLOOD SITE NOT SPECIFIED    Special Requests      BOTTLES DRAWN AEROBIC AND ANAEROBIC Blood Culture adequate volume   Culture      NO GROWTH < 12 HOURS Performed at [facility], [address] [place], [place] [address]    Report Status PENDING   Troponin I (High Sensitivity)     Status: Abnormal   Collection Time: 12/11/23  6:34 PM  Result Value Ref Range   Troponin I (High Sensitivity) 31 (H) <18 ng/L    Comment: (NOTE) Elevated high sensitivity troponin I (hsTnI) values and significant  changes across serial measurements may suggest ACS but many other  chronic and acute conditions are known to elevate hsTnI results.  Refer to the Links section for chest pain algorithms and additional  guidance. Performed at [facility], [address], [place], [place] [address]   Hemoglobin and hematocrit, blood     Status: Abnormal   Collection Time:  12/11/23  6:34 PM  Result Value Ref Range   Hemoglobin 8.6 (L) 13.0 - 17.0 g/dL   HCT 71.4 (L) 60.9 - 47.9 %    Comment: Performed at [facility], [address], [place], [place] [address]  C-reactive protein     Status: Abnormal   Collection Time: 12/12/23  6:31 AM  Result Value Ref Range   CRP 22.8 (H) <1.0 mg/dL    Comment: Performed at [facility], [address], [place], [place] [address]  Procalcitonin     Status: None   Collection Time: 12/12/23  6:31 AM  Result Value Ref Range   Procalcitonin 4.66 ng/mL    Comment:        Interpretation: PCT > 2 ng/mL: Systemic infection (sepsis) is likely,  unless other causes are known. (NOTE)       Sepsis PCT Algorithm           Lower Respiratory Tract                                      Infection PCT Algorithm    ----------------------------     ----------------------------         PCT < 0.25 ng/mL                PCT < 0.10 ng/mL          Strongly encourage             Strongly discourage   discontinuation of antibiotics    initiation of antibiotics    ----------------------------     -----------------------------       PCT 0.25 - 0.50 ng/mL            PCT 0.10 - 0.25 ng/mL               OR       >80% decrease in PCT            Discourage initiation of                                            antibiotics      Encourage discontinuation           of antibiotics    ----------------------------     -----------------------------         PCT >= 0.50 ng/mL              PCT 0.26 - 0.50 ng/mL               AND       <80% decrease in PCT              Encourage initiation of                                             antibiotics       Encourage continuation           of antibiotics    ----------------------------     -----------------------------        PCT >= 0.50 ng/mL                  PCT > 0.50 ng/mL               AND         increase in PCT                  Strongly encourage                                       initiation of antibiotics    Strongly encourage escalation           of antibiotics                                     -----------------------------  PCT <= 0.25 ng/mL                                                 OR                                        > 80% decrease in PCT                                      Discontinue / Do not initiate                                             antibiotics  Performed at [facility], [address], [place], [place] [address]   CBC     Status: Abnormal   Collection Time: 12/12/23  6:36 AM  Result Value Ref Range   WBC 9.1 4.0 - 10.5 K/uL   RBC 2.87 (L) 4.22 - 5.81 MIL/uL   Hemoglobin 8.3 (L) 13.0 - 17.0 g/dL   HCT 73.0 (L) 60.9 - 47.9 %   MCV 93.7 80.0 - 100.0 fL   MCH 28.9 26.0 - 34.0 pg   MCHC 30.9 30.0 - 36.0 g/dL   RDW 85.3 88.4 - 84.4 %   Platelets 167 150 - 400 K/uL   nRBC 0.0 0.0 - 0.2 %    Comment: Performed at [facility], [address], [place], [place] [address]  Basic metabolic panel     Status: Abnormal   Collection Time: 12/12/23  6:36 AM  Result Value Ref Range   Sodium 137 135 - 145 mmol/L   Potassium 5.2 (H) 3.5 - 5.1 mmol/L   Chloride 104 98 - 111 mmol/L   CO2 23 22 - 32 mmol/L   Glucose, Bld 166 (H) 70 - 99 mg/dL    Comment: Glucose reference range applies only to samples taken after fasting for at least 8 hours.   BUN 32 (H) 8 - 23 mg/dL   Creatinine, Ser 7.51 (H) 0.61 - 1.24 mg/dL   Calcium  8.5 (L) 8.9 - 10.3 mg/dL   GFR, Estimated 26 (L) >60 mL/min    Comment: (NOTE) Calculated using the CKD-EPI Creatinine Equation (2021)    Anion gap 10 5 - 15    Comment: Performed at [facility], [address], [place], [place] [address]  Brain natriuretic peptide     Status: Abnormal   Collection Time: 12/12/23  5:54 PM  Result Value Ref Range   B Natriuretic Peptide 731.8 (H) 0.0 - 100.0 pg/mL    Comment: Performed at [facility], [address], [place], [place] [address]    Imaging / Studies: DG Chest Port 1 View Result Date: 12/12/2023 CLINICAL DATA:  Shortness of breath. EXAM: PORTABLE CHEST 1 VIEW COMPARISON:  Radiographs 12/12/2023 and 12/11/2023.  CT 03/16/2008. FINDINGS: 1648 hours. There are lower lung volumes. The heart size and mediastinal contours are stable. Increased patchy opacities at both lung bases, likely atelectasis. No confluent airspace disease, definite edema, pneumothorax or significant pleural  effusion. The bones appear unchanged. IMPRESSION: Lower lung volumes with increased patchy opacities at both lung bases, likely atelectasis. Electronically Signed   By: Elsie Perone M.D.   On: 12/12/2023 16:59   DG Chest Port 1V same Day Result Date: 12/12/2023 CLINICAL DATA:  Shortness of breath EXAM: PORTABLE CHEST 1 VIEW COMPARISON:  12/11/2023 FINDINGS: Single frontal view of the chest demonstrates borderline enlargement the cardiac silhouette. No airspace disease, effusion, or pneumothorax. No acute bony abnormalities. IMPRESSION: 1. Borderline enlargement of the cardiac silhouette. 2. No acute airspace disease. Electronically Signed   By: Ozell Daring M.D.   On: 12/12/2023 09:32   DG CHEST PORT 1 VIEW Result Date: 12/11/2023 CLINICAL DATA:  141880 SOB (shortness of breath) [id number] EXAM: PORTABLE CHEST 1 VIEW COMPARISON:  AP radiograph the chest dated December 11, 2023. FINDINGS: The heart is normal in size. Pulmonary vasculature is unremarkable. There is a calcified granuloma in the right midlung zone. The lungs appear clear otherwise. IMPRESSION: No active disease. Electronically Signed   By: Evalene Coho M.D.   On: 12/11/2023 21:04   DG Chest Port 1 View Result Date: 12/11/2023 CLINICAL DATA:  Questionable sepsis - evaluate for abnormality. Tachypnea. EXAM: PORTABLE CHEST 1 VIEW COMPARISON:  07/12/2023. FINDINGS: Stable calcified granuloma overlying the right lower lung zone, laterally. Bilateral lung fields are  clear. No acute consolidation or lung collapse. Bilateral costophrenic angles are clear. Stable cardio-mediastinal silhouette. No acute osseous abnormalities. The soft tissues are within normal limits. IMPRESSION: No active disease. Electronically Signed   By: Ree Molt M.D.   On: 12/11/2023 13:08    Medications / Allergies: per chart  Antibiotics: Anti-infectives (From admission, onward)    Start     Dose/Rate Route Frequency Ordered Stop   12/12/23 1000  fluconazole  (DIFLUCAN ) tablet 400 mg        400 mg Oral Daily 12/11/23 1742     12/12/23 1000  remdesivir  100 mg in sodium chloride  0.9 % 100 mL IVPB       Placed in Followed by Linked Group   100 mg 200 mL/hr over 30 Minutes Intravenous Daily 12/11/23 1852 12/14/23 0959   12/12/23 0400  cefTRIAXone  (ROCEPHIN ) 2 g in sodium chloride  0.9 % 100 mL IVPB        2 g 200 mL/hr over 30 Minutes Intravenous Every 24 hours 12/12/23 0337     12/11/23 2000  remdesivir  200 mg in sodium chloride  0.9% 250 mL IVPB       Placed in Followed by Linked Group   200 mg 580 mL/hr over 30 Minutes Intravenous Once 12/11/23 1852 12/11/23 2201   12/11/23 1245  ceFEPIme  (MAXIPIME ) 2 g in sodium chloride  0.9 % 100 mL IVPB        2 g 200 mL/hr over 30 Minutes Intravenous  Once 12/11/23 1230 12/11/23 1534   12/11/23 1245  metroNIDAZOLE  (FLAGYL ) IVPB 500 mg  Status:  Discontinued        500 mg 100 mL/hr over 60 Minutes Intravenous  Once 12/11/23 1230 12/11/23 1517   12/11/23 1245  vancomycin  (VANCOCIN ) IVPB 1000 mg/200 mL premix  Status:  Discontinued        1,000 mg 200 mL/hr over 60 Minutes Intravenous  Once 12/11/23 1230 12/11/23 1230   12/11/23 1245  vancomycin  (VANCOREADY) IVPB 2000 mg/400 mL  Status:  Discontinued        2,000 mg 200 mL/hr over 120 Minutes Intravenous  Once 12/11/23 1230 12/11/23 1517  Note: Portions of this report may have been transcribed using voice recognition software. Every effort was made to ensure accuracy;  however, inadvertent computerized transcription errors may be present.   Any transcriptional errors that result from this process are unintentional.    Elspeth KYM Schultze, MD, FACS, MASCRS Esophageal, Gastrointestinal & Colorectal Surgery Robotic and Minimally Invasive Surgery  [facility] [address], [address] [place], [place] [address] [phone] Fax [phone] Main  CONTACT INFORMATION: Weekday (9AM-5PM): Call [facility] main office at [phone] Weeknight (5PM-9AM) or Weekend/Holiday: Check EPIC Web Links tab & use AMION (password  [email or web address]) for General Surgery [facility] coverage  Please, DO NOT use SecureChat  (it is not reliable communication to reach operating surgeons & will lead to a delay in care).   Epic staff messaging available for outptient concerns needing 1-2 business day response.      12/13/2023  6:32 AM

## 2023-12-13 NOTE — Discharge Instructions (Signed)
 To address social isolation:  Education officer, [employer] [place]: [phone] / [address], [place], [place] [address]  -Dial [phone]: Talk lifeline 24/7.  -[facility] Warmline: [phone]

## 2023-12-13 NOTE — Progress Notes (Signed)
 Attempted to call patients wife today at 87 to give clinical update/answer surgical questions. Went straight to [employer]. I will re-attempt later this afternoon if time permits.    Almarie Pringle, PA-C [facility] Please see Amion for pager number during day hours 7:00am-4:30pm

## 2023-12-13 NOTE — Plan of Care (Signed)

## 2023-12-13 NOTE — TOC Progression Note (Signed)
 Transition of Care [facility]) - Progression Note    Patient Details  Name: Calvin Escobar MRN: [id number] Date of Birth: 07-Dec-1950  Transition of Care [facility]) CM/SW Contact  Corean JAYSON Canary, RN Phone Number: 12/13/2023, 1:30 PM  Clinical Narrative:     Patient is currently active with [facility] for PT they can add on treatments as needed when DC         Expected Discharge Plan and Services                                               Social Determinants of Health (SDOH) Interventions SDOH Screenings   Food Insecurity: No Food Insecurity (12/11/2023)  Housing: Low Risk  (12/11/2023)  Transportation Needs: No Transportation Needs (12/11/2023)  Utilities: Not At Risk (12/11/2023)  Alcohol Screen: Low Risk  (10/10/2023)  Depression (PHQ2-9): Low Risk  (12/09/2023)  Recent Concern: Depression (PHQ2-9) - Medium Risk (09/16/2023)  Financial Resource Strain: Low Risk  (10/10/2023)  Physical Activity: Sufficiently Active (10/10/2023)  Social Connections: Socially Isolated (12/11/2023)  Stress: No Stress Concern Present (10/10/2023)  Tobacco Use: Low Risk  (12/05/2023)  Health Literacy: Adequate Health Literacy (10/10/2023)    Readmission Risk Interventions    07/04/2023    1:27 PM 07/03/2023    2:48 PM 06/05/2023   10:12 AM  Readmission Risk Prevention Plan  Transportation Screening Complete Complete Complete  PCP or Specialist Appt within 5-7 Days Complete Complete   PCP or Specialist Appt within 3-5 Days   Complete  Home Care Screening Complete Complete   Medication Review (RN CM) Complete Complete   HRI or Home Care Consult   Complete  Social Work Consult for Recovery Care Planning/Counseling   Complete  Palliative Care Screening   Complete  Medication Review Oceanographer)   Complete

## 2023-12-14 ENCOUNTER — Inpatient Hospital Stay (HOSPITAL_COMMUNITY)

## 2023-12-14 DIAGNOSIS — M7989 Other specified soft tissue disorders: Secondary | ICD-10-CM | POA: Diagnosis not present

## 2023-12-14 DIAGNOSIS — U071 COVID-19: Secondary | ICD-10-CM | POA: Diagnosis not present

## 2023-12-14 LAB — COMPREHENSIVE METABOLIC PANEL WITH GFR
ALT: 14 U/L (ref 0–44)
AST: 21 U/L (ref 15–41)
Albumin: 2.5 g/dL — ABNORMAL LOW (ref 3.5–5.0)
Alkaline Phosphatase: 54 U/L (ref 38–126)
Anion gap: 12 (ref 5–15)
BUN: 66 mg/dL — ABNORMAL HIGH (ref 8–23)
CO2: 20 mmol/L — ABNORMAL LOW (ref 22–32)
Calcium: 7.9 mg/dL — ABNORMAL LOW (ref 8.9–10.3)
Chloride: 105 mmol/L (ref 98–111)
Creatinine, Ser: 2.41 mg/dL — ABNORMAL HIGH (ref 0.61–1.24)
GFR, Estimated: 27 mL/min — ABNORMAL LOW (ref >60)
Glucose, Bld: 144 mg/dL — ABNORMAL HIGH (ref 70–99)
Potassium: 4.4 mmol/L (ref 3.5–5.1)
Sodium: 137 mmol/L (ref 135–145)
Total Bilirubin: 0.5 mg/dL (ref 0.0–1.2)
Total Protein: 6.2 g/dL — ABNORMAL LOW (ref 6.5–8.1)

## 2023-12-14 LAB — CBC WITH DIFFERENTIAL/PLATELET
Abs Immature Granulocytes: 0.32 K/uL — ABNORMAL HIGH (ref 0.00–0.07)
Basophils Absolute: 0 K/uL (ref 0.0–0.1)
Basophils Relative: 0 %
Eosinophils Absolute: 0 K/uL (ref 0.0–0.5)
Eosinophils Relative: 0 %
HCT: 24 % — ABNORMAL LOW (ref 39.0–52.0)
Hemoglobin: 7.5 g/dL — ABNORMAL LOW (ref 13.0–17.0)
Immature Granulocytes: 2 %
Lymphocytes Relative: 4 %
Lymphs Abs: 0.7 K/uL (ref 0.7–4.0)
MCH: 28.6 pg (ref 26.0–34.0)
MCHC: 31.3 g/dL (ref 30.0–36.0)
MCV: 91.6 fL (ref 80.0–100.0)
Monocytes Absolute: 0.8 K/uL (ref 0.1–1.0)
Monocytes Relative: 4 %
Neutro Abs: 16.4 K/uL — ABNORMAL HIGH (ref 1.7–7.7)
Neutrophils Relative %: 90 %
Platelets: 131 K/uL — ABNORMAL LOW (ref 150–400)
RBC: 2.62 MIL/uL — ABNORMAL LOW (ref 4.22–5.81)
RDW: 14.2 % (ref 11.5–15.5)
WBC: 18.3 K/uL — ABNORMAL HIGH (ref 4.0–10.5)
nRBC: 0 % (ref 0.0–0.2)

## 2023-12-14 LAB — PHOSPHORUS: Phosphorus: 5.3 mg/dL — ABNORMAL HIGH (ref 2.5–4.6)

## 2023-12-14 LAB — BRAIN NATRIURETIC PEPTIDE: B Natriuretic Peptide: 712.2 pg/mL — ABNORMAL HIGH (ref 0.0–100.0)

## 2023-12-14 LAB — PROCALCITONIN: Procalcitonin: 114.8 ng/mL

## 2023-12-14 LAB — CULTURE, BLOOD (ROUTINE X 2)

## 2023-12-14 LAB — MAGNESIUM: Magnesium: 2 mg/dL (ref 1.7–2.4)

## 2023-12-14 LAB — C-REACTIVE PROTEIN: CRP: 19.3 mg/dL — ABNORMAL HIGH (ref <1.0)

## 2023-12-14 MED ORDER — DIPHENHYDRAMINE HCL 25 MG PO CAPS
25.0000 mg | ORAL_CAPSULE | Freq: Once | ORAL | Status: AC
  Administered 2023-12-14: 25 mg via ORAL
  Filled 2023-12-14: qty 1

## 2023-12-14 MED ORDER — CARVEDILOL 6.25 MG PO TABS
6.2500 mg | ORAL_TABLET | Freq: Two times a day (BID) | ORAL | Status: DC
  Administered 2023-12-14 – 2023-12-19 (×10): 6.25 mg via ORAL
  Filled 2023-12-14 (×11): qty 1

## 2023-12-14 NOTE — Progress Notes (Signed)
 VASCULAR LAB    Bilateral lower extremity venous duplex has been performed.  See CV proc for preliminary results.   Viktor Philipp, RVT 12/14/2023, 12:07 PM

## 2023-12-14 NOTE — Progress Notes (Signed)
 [facility] night cross cover note:   I was notified by the patient's RN that the patient is requesting an additional sleep aid in the setting of insomnia refractory to receipt of trazodone  100 mg p.o., melatonin 3 mg p.o. as well as Haldol  2 mg IM x 1 this evening.  I subsequently ordered Benadryl  25 mg p.o. x 1 dose now up to try to help further address his insomnia.     Eva Pore, DO Hospitalist

## 2023-12-14 NOTE — Progress Notes (Signed)
 PROGRESS NOTE        PATIENT DETAILS Name: Calvin Escobar Age: 73 y.o. Sex: male Date of Birth: [birth year] Admit Date: 12/11/2023 Admitting Physician Maximino DELENA Sharps, MD ERE:[id number], [name], MD  Brief Summary: Patient is a 73 y.o.  male with history of colovesical fistula-numerous recent hospitalizations for sepsis related to UTI/bacteremia-just discharged from this facility on 7/5 for candidemia on oral Diflucan -presented to the hospital with fever-found to have COVID-19 infection and E. coli bacteremia.  Significant events: 7/9>> admit to [facility]-fever-COVID-19 infection/E. coli bacteremia.  Post admission-had an episode of respiratory distress/bronchospasm-started on BiPAP-transfer to progressive care. 7/10>> liberated off BiPAP-on room air-another episode of respiratory distress later in the afternoon-on BiPAP-given Lasix /steroids/bronchodilators. 7/11>> taken off BiPAP this morning-on 1-2 L of oxygen.  Calm/comfortable-not in any distress.  Significant studies: 6/30>> CT abdomen/pelvis: Marked bladder wall thickening with surrounding edema/fluid-suspected tract from sigmoid colon to posterior bladder.  Significant microbiology data: 7/9>> COVID PCR: Positive 7/9>> influenza /RSV PCR: Negative. 7/9>> blood gram culture: E Coli  Procedures: 7/3>> colonoscopy-diverticulosis, severe luminal narrowing distal sigmoid TTE   1. Left ventricular ejection fraction, by estimation, is 25 to 30%. The left ventricle has severely decreased function. The left ventricle demonstrates global hypokinesis. There is mild left ventricular hypertrophy. Left ventricular diastolic parameters  are indeterminate.  2. Right ventricular systolic function is normal. The right ventricular size is normal. There is mildly elevated pulmonary artery systolic pressure. The estimated right ventricular systolic pressure is 43.9 mmHg.  3. The mitral valve is normal in structure. Mild to moderate  mitral valve regurgitation.  4. The aortic valve is tricuspid. Aortic valve regurgitation is not visualized. No aortic stenosis is present.  5. Aortic dilatation noted. There is dilatation of the ascending aorta, measuring 41 mm.  6. The inferior vena cava is dilated in size with <50% respiratory variability, suggesting right atrial pressure of 15 mmHg  Consults: ID Cards CCS  Subjective:  Patient in bed, appears comfortable, denies any headache, no fever, no chest pain or pressure, no shortness of breath , no abdominal pain. No new focal weakness.    Objective: Vitals: Blood pressure 126/75, pulse (!) 55, temperature 97.6 F (36.4 C), temperature source Oral, resp. rate 18, weight 101.7 kg, SpO2 100%.   Exam:  Awake Alert, No new F.N deficits, Normal affect [place].AT,PERRAL Supple Neck, No JVD,   Symmetrical Chest wall movement, Good air movement bilaterally, CTAB RRR,No Gallops, Rubs or new Murmurs,  +ve B.Sounds, Abd Soft, No tenderness,   No Cyanosis, Clubbing or edema    Assessment/Plan:  Severe sepsis secondary to polymicrobial bloodstream infection (candidemia on 6/18 and E. coli bacteremia 7/9)-present on admission (fever/tachypnea/tachycardia/leukocytosis with evidence of AKI) Sepsis physiology has resolved-leukocytosis is likely from steroid. Polymicrobial bloodstream infection-likely secondary to symptomatic chronic colovesical fistula Already on fluconazole  from prior admission, now on IV Rocephin . ID and CCS following, may require definitive surgery will defer that to general surgery, sepsis pathophysiology has improved.  If needed patient has been cleared by cardiology for surgical procedure on 12/13/2023.  Communicated that to general surgery as well.  COVID-19 infection Most likely incidental monitor.  No signs of clinical pneumonitis.  AKI on CKD stage IIIa with hyperkalemia AKI likely hemodynamic mediated in the setting of sepsis-possible CHF. Improved with  supportive care and diuretics.  Received Lokelma  for hyperkalemia which has improved as  well.  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  Required IV diuretics and BiPAP, now stable on room air on 12/14/2023, continue supportive care, cardiology following.  Currently on Coreg  monitor, as needed Lasix .  Normocytic anemia Due to combination of acute illness/ongoing CKD Follow CBC  Elevated D-dimer.  Likely due to sepsis.  Had mild lower extremity swelling, check venous duplex.  No chest pain, currently no hypoxia, echo with relatively stable right-sided physiology clinically no PE.    History of colovesical fistula Likely cause of recurrent infections/bacteremia/candidemia S/p recent colonoscopy-with sigmoid stricture-suspected to be the site of fistula. CCS on board as above  History of BPH History of urethral stricture-s/p dilated by Dr. Alvaro on 6/28-with chronic indwelling Foley catheter Recommendations by urology (consult note on 6/28) were to keep Foley catheter in place-monthly changes recommended.  History of mood disorder Stable Abilify /BuSpar /trazodone /Effexor   History of intention tremor Primidone .  Class 1 Obesity: BMI of 30.  Follow-up with PCP for weight loss.    Code status:   Code Status: Do not attempt resuscitation (DNR) PRE-ARREST INTERVENTIONS DESIRED   DVT Prophylaxis: heparin  injection 5,000 Units Start: 12/12/23 1400 SCDs Start: 12/11/23 1741   Family Communication: Spouse-[name] [phone] updated 7/10   Disposition Plan: Status is: Inpatient Remains inpatient appropriate because: Severity of illness   Planned Discharge Destination:Home health   Diet: Diet Order             Diet Heart Room service appropriate? Yes; Fluid consistency: Thin  Diet effective now                     Data Review:   Inpatient Medications  Scheduled Meds:  ARIPiprazole   2 mg Oral  q AM   aspirin   324 mg Oral Once   budesonide  (PULMICORT ) nebulizer solution  0.25 mg Nebulization BID   busPIRone   10 mg Oral TID   carvedilol   12.5 mg Oral BID   Chlorhexidine  Gluconate Cloth  6 each Topical Daily   finasteride   5 mg Oral Daily   fluconazole   400 mg Oral Daily   folic acid   1 mg Oral Daily   gabapentin   100 mg Oral TID   guaiFENesin   600 mg Oral BID   heparin  injection (subcutaneous)  5,000 Units Subcutaneous Q8H   melatonin  3 mg Oral QHS   midodrine   5 mg Oral TID WC   mirtazapine   30 mg Oral QHS   pravastatin   20 mg Oral QHS   predniSONE   40 mg Oral Q breakfast   primidone   50 mg Oral TID   sodium chloride  flush  3 mL Intravenous Q12H   tamsulosin   0.4 mg Oral Daily   traZODone   100 mg Oral QHS   venlafaxine  XR  150 mg Oral Daily   Continuous Infusions:  cefTRIAXone  (ROCEPHIN )  IV 2 g (12/14/23 0519)   PRN Meds:.acetaminophen  **OR** acetaminophen , haloperidol  lactate, levalbuterol     Recent Labs  Lab 12/11/23 1344 12/11/23 1346 12/11/23 1834 12/12/23 0636 12/13/23 0657 12/14/23 0600  WBC 10.8*  --   --  9.1 23.5* 18.3*  HGB 8.2* 8.2*  8.5* 8.6* 8.3* 7.2* 7.5*  HCT 26.7* 24.0*  25.0* 28.5* 26.9* 23.7* 24.0*  PLT 211  --   --  167 116* 131*  MCV 93.7  --   --  93.7 94.0 91.6  MCH 28.8  --   --  28.9 28.6 28.6  MCHC 30.7  --   --  30.9 30.4 31.3  RDW 14.7  --   --  14.6 14.6 14.2  LYMPHSABS 0.8  --   --   --   --  0.7  MONOABS 1.6*  --   --   --   --  0.8  EOSABS 0.1  --   --   --   --  0.0  BASOSABS 0.1  --   --   --   --  0.0    Recent Labs  Lab 12/11/23 1330 12/11/23 1344 12/11/23 1346 12/11/23 1347 12/12/23 0631 12/12/23 0636 12/12/23 1754 12/13/23 0657 12/14/23 0600  NA  --  136 135  134*  --   --  137  --  138 137  K  --  5.9* 5.8*  5.9*  --   --  5.2*  --  5.0 4.4  CL  --  102 102  --   --  104  --  106 105  CO2  --  21*  --   --   --  23  --  21* 20*  ANIONGAP  --  13  --   --   --  10  --  11 12  GLUCOSE  --  113* 111*   --   --  166*  --  184* 144*  BUN  --  25* 27*  --   --  32*  --  52* 66*  CREATININE  --  2.40* 2.50*  --   --  2.48*  --  2.75* 2.41*  AST  --  15  --   --   --   --   --   --  21  ALT  --  9  --   --   --   --   --   --  14  ALKPHOS  --  59  --   --   --   --   --   --  54  BILITOT  --  0.3  --   --   --   --   --   --  0.5  ALBUMIN   --  2.9*  --   --   --   --   --   --  2.5*  CRP  --   --   --   --  22.8*  --   --  22.7* 19.3*  DDIMER  --   --   --   --   --   --   --  3.96*  --   PROCALCITON  --   --   --   --  4.66  --   --  >150.00  --   LATICACIDVEN  --   --   --  1.2  --   --   --   --   --   INR  --  1.2  --   --   --   --   --   --   --   BNP 455.8*  --   --   --   --   --  731.8* 629.9* 712.2*  MG  --   --   --   --   --   --   --   --  2.0  PHOS  --   --   --   --   --   --   --   --  5.3*  CALCIUM   --  8.5*  --   --   --  8.5*  --  8.0* 7.9*      Recent Labs  Lab 12/11/23 1330 12/11/23 1344 12/11/23 1347 12/12/23 0631 12/12/23 0636 12/12/23 1754 12/13/23 0657 12/14/23 0600  CRP  --   --   --  22.8*  --   --  22.7* 19.3*  DDIMER  --   --   --   --   --   --  3.96*  --   PROCALCITON  --   --   --  4.66  --   --  >150.00  --   LATICACIDVEN  --   --  1.2  --   --   --   --   --   INR  --  1.2  --   --   --   --   --   --   BNP 455.8*  --   --   --   --  731.8* 629.9* 712.2*  MG  --   --   --   --   --   --   --  2.0  CALCIUM   --  8.5*  --   --  8.5*  --  8.0* 7.9*    --------------------------------------------------------------------------------------------------------------- Lab Results  Component Value Date   CHOL 118 08/14/2023   HDL 39 (L) 08/14/2023   LDLCALC 54 08/14/2023   TRIG 168 (H) 08/14/2023   CHOLHDL 3.0 08/14/2023    Lab Results  Component Value Date   HGBA1C 5.3 08/14/2023   No results for input(s): TSH, T4TOTAL, FREET4, T3FREE, THYROIDAB in the last 72 hours. No results for input(s): VITAMINB12, FOLATE, FERRITIN,  TIBC, IRON, RETICCTPCT in the last 72 hours. ------------------------------------------------------------------------------------------------------------------ Cardiac Enzymes No results for input(s): CKMB, TROPONINI, MYOGLOBIN in the last 168 hours.  Invalid input(s): CK  Micro Results Recent Results (from the past 240 hours)  Resp panel by RT-PCR (RSV, Flu A&B, Covid) Anterior Nasal Swab     Status: Abnormal   Collection Time: 12/11/23 12:29 PM   Specimen: Anterior Nasal Swab  Result Value Ref Range Status   SARS Coronavirus 2 by RT PCR POSITIVE (A) NEGATIVE Final   Influenza A by PCR NEGATIVE NEGATIVE Final   Influenza B by PCR NEGATIVE NEGATIVE Final    Comment: (NOTE) The Xpert Xpress SARS-CoV-2/FLU/RSV plus assay is intended as an aid in the diagnosis of influenza from Nasopharyngeal swab specimens and should not be used as a sole basis for treatment. Nasal washings and aspirates are unacceptable for Xpert Xpress SARS-CoV-2/FLU/RSV testing.  Fact Sheet for Patients: BloggerCourse.com  Fact Sheet for Healthcare Providers: SeriousBroker.it  This test is not yet approved or cleared by the [place]  FDA and has been authorized for detection and/or diagnosis of SARS-CoV-2 by FDA under an Emergency Use Authorization (EUA). This EUA will remain in effect (meaning this test can be used) for the duration of the COVID-19 declaration under Section 564(b)(1) of the Act, 21 U.S.C. section 360bbb-3(b)(1), unless the authorization is terminated or revoked.     Resp Syncytial Virus by PCR NEGATIVE NEGATIVE Final    Comment: (NOTE) Fact Sheet for Patients: BloggerCourse.com  Fact Sheet for Healthcare Providers: SeriousBroker.it  This test is not yet approved or cleared by the [place]  FDA and has been authorized for detection and/or diagnosis of SARS-CoV-2  by FDA under an Emergency Use Authorization (EUA). This EUA will remain in effect (meaning this test can be used) for the duration of the COVID-19 declaration under Section 564(b)(1) of the Act, 21  U.S.C. section 360bbb-3(b)(1), unless the authorization is terminated or revoked.  Performed at [facility], [address], [place], [place] [address]   Blood Culture (routine x 2)     Status: Abnormal (Preliminary result)   Collection Time: 12/11/23  1:30 PM   Specimen: BLOOD RIGHT FOREARM  Result Value Ref Range Status   Specimen Description BLOOD RIGHT FOREARM  Final   Special Requests   Final    BOTTLES DRAWN AEROBIC AND ANAEROBIC Blood Culture results may not be optimal due to an inadequate volume of blood received in culture bottles   Culture  Setup Time   Final    GRAM NEGATIVE RODS IN BOTH AEROBIC AND ANAEROBIC BOTTLES CRITICAL RESULT CALLED TO, READ BACK BY AND VERIFIED WITH: PHARMD JAMES LEDFORD [id number] BY J RAZZAK, MT    Culture (A)  Final    ESCHERICHIA COLI SUSCEPTIBILITIES TO FOLLOW Performed at [facility], [address], [place], [place] [address]    Report Status PENDING  Incomplete  Blood Culture ID Panel (Reflexed)     Status: Abnormal   Collection Time: 12/11/23  1:30 PM  Result Value Ref Range Status   Enterococcus faecalis NOT DETECTED NOT DETECTED Final   Enterococcus Faecium NOT DETECTED NOT DETECTED Final   Listeria monocytogenes NOT DETECTED NOT DETECTED Final   Staphylococcus species NOT DETECTED NOT DETECTED Final   Staphylococcus aureus (BCID) NOT DETECTED NOT DETECTED Final   Staphylococcus epidermidis NOT DETECTED NOT DETECTED Final   Staphylococcus lugdunensis NOT DETECTED NOT DETECTED Final   Streptococcus species NOT DETECTED NOT DETECTED Final   Streptococcus agalactiae NOT DETECTED NOT DETECTED Final   Streptococcus pneumoniae NOT DETECTED NOT DETECTED Final   Streptococcus pyogenes NOT DETECTED NOT DETECTED Final    A.calcoaceticus-baumannii NOT DETECTED NOT DETECTED Final   Bacteroides fragilis NOT DETECTED NOT DETECTED Final   Enterobacterales DETECTED (A) NOT DETECTED Final    Comment: Enterobacterales represent a large order of gram negative bacteria, not a single organism. CRITICAL RESULT CALLED TO, READ BACK BY AND VERIFIED WITH: PHARMD JAMES LEDFORD [id number] BY J RAZZAK, MT    Enterobacter cloacae complex NOT DETECTED NOT DETECTED Final   Escherichia coli DETECTED (A) NOT DETECTED Final    Comment: CRITICAL RESULT CALLED TO, READ BACK BY AND VERIFIED WITH: PHARMD JAMES LEDFORD [id number] BY J RAZZAK, MT    Klebsiella aerogenes NOT DETECTED NOT DETECTED Final   Klebsiella oxytoca NOT DETECTED NOT DETECTED Final   Klebsiella pneumoniae NOT DETECTED NOT DETECTED Final   Proteus species NOT DETECTED NOT DETECTED Final   Salmonella species NOT DETECTED NOT DETECTED Final   Serratia marcescens NOT DETECTED NOT DETECTED Final   Haemophilus influenzae NOT DETECTED NOT DETECTED Final   Neisseria meningitidis NOT DETECTED NOT DETECTED Final   Pseudomonas aeruginosa NOT DETECTED NOT DETECTED Final   Stenotrophomonas maltophilia NOT DETECTED NOT DETECTED Final   Candida albicans NOT DETECTED NOT DETECTED Final   Candida auris NOT DETECTED NOT DETECTED Final   Candida glabrata NOT DETECTED NOT DETECTED Final   Candida krusei NOT DETECTED NOT DETECTED Final   Candida parapsilosis NOT DETECTED NOT DETECTED Final   Candida tropicalis NOT DETECTED NOT DETECTED Final   Cryptococcus neoformans/gattii NOT DETECTED NOT DETECTED Final   CTX-M ESBL NOT DETECTED NOT DETECTED Final   Carbapenem resistance IMP NOT DETECTED NOT DETECTED Final   Carbapenem resistance KPC NOT DETECTED NOT DETECTED Final   Carbapenem resistance NDM NOT DETECTED NOT DETECTED  Final   Carbapenem resist OXA 48 LIKE NOT DETECTED NOT DETECTED Final   Carbapenem resistance VIM NOT DETECTED NOT DETECTED Final    Comment: Performed  at [facility], [address] [place], [place] [address]  Blood Culture (routine x 2)     Status: None (Preliminary result)   Collection Time: 12/11/23  6:30 PM   Specimen: BLOOD  Result Value Ref Range Status   Specimen Description BLOOD SITE NOT SPECIFIED  Final   Special Requests   Final    BOTTLES DRAWN AEROBIC AND ANAEROBIC Blood Culture adequate volume   Culture   Final    NO GROWTH 3 DAYS Performed at [facility], [address] [place], [place] [address]    Report Status PENDING  Incomplete    Radiology Reports  ECHOCARDIOGRAM COMPLETE Result Date: 12/13/2023    ECHOCARDIOGRAM REPORT   Patient Name:   Calvin Escobar [facility] Date of Exam: 12/13/2023 Medical Rec #:  [id number]          Height:       72.0 in Accession #:    [id number]         Weight:       238.1 lb Date of Birth:  [birth year]          BSA:          2.294 m Patient Age:    73 years           BP:           91/60 mmHg Patient Gender: M                  HR:           63 bpm. Exam Location:  Inpatient Procedure: 2D Echo, Color Doppler and Cardiac Doppler (Both Spectral and Color            Flow Doppler were utilized during procedure). Indications:    Dyspnea  History:        Patient has prior history of Echocardiogram examinations, most                 recent 05/23/2023. Risk Factors:HDL.  Sonographer:    Benard Stallion Referring Phys: [id number] DONALDA M GHIMIRE IMPRESSIONS  1. Left ventricular ejection fraction, by estimation, is 25 to 30%. The left ventricle has severely decreased function. The left ventricle demonstrates global hypokinesis. There is mild left ventricular hypertrophy. Left ventricular diastolic parameters  are indeterminate.  2. Right ventricular systolic function is normal. The right ventricular size is normal. There is mildly elevated pulmonary artery systolic pressure. The estimated right ventricular systolic pressure is 43.9 mmHg.  3. The mitral valve is normal in structure. Mild to moderate mitral  valve regurgitation.  4. The aortic valve is tricuspid. Aortic valve regurgitation is not visualized. No aortic stenosis is present.  5. Aortic dilatation noted. There is dilatation of the ascending aorta, measuring 41 mm.  6. The inferior vena cava is dilated in size with <50% respiratory variability, suggesting right atrial pressure of 15 mmHg. FINDINGS  Left Ventricle: Left ventricular ejection fraction, by estimation, is 25 to 30%. The left ventricle has severely decreased function. The left ventricle demonstrates global hypokinesis. The left ventricular internal cavity size was normal in size. There is mild left ventricular hypertrophy. Left ventricular diastolic parameters are indeterminate. Right Ventricle: The right ventricular size is normal. No increase in right ventricular wall thickness. Right ventricular systolic function is normal. There is  mildly elevated pulmonary artery systolic pressure. The tricuspid regurgitant velocity is 2.69  m/s, and with an assumed right atrial pressure of 15 mmHg, the estimated right ventricular systolic pressure is 43.9 mmHg. Left Atrium: Left atrial size was normal in size. Right Atrium: Right atrial size was normal in size. Pericardium: There is no evidence of pericardial effusion. Mitral Valve: The mitral valve is normal in structure. Mild to moderate mitral valve regurgitation. Tricuspid Valve: The tricuspid valve is normal in structure. Tricuspid valve regurgitation is mild. Aortic Valve: The aortic valve is tricuspid. Aortic valve regurgitation is not visualized. No aortic stenosis is present. Aortic valve mean gradient measures 2.0 mmHg. Aortic valve peak gradient measures 3.6 mmHg. Aortic valve area, by VTI measures 3.67 cm. Pulmonic Valve: The pulmonic valve was not well visualized. Pulmonic valve regurgitation is trivial. Aorta: The aortic root is normal in size and structure and aortic dilatation noted. There is dilatation of the ascending aorta, measuring 41  mm. Venous: The inferior vena cava is dilated in size with less than 50% respiratory variability, suggesting right atrial pressure of 15 mmHg. IAS/Shunts: The interatrial septum was not well visualized.  LEFT VENTRICLE PLAX 2D LVIDd:         5.40 cm   Diastology LVIDs:         4.90 cm   LV e' medial:  5.33 cm/s LV PW:         1.10 cm   LV e' lateral: 7.40 cm/s LV IVS:        1.10 cm LVOT diam:     2.20 cm LV SV:         76 LV SV Index:   33 LVOT Area:     3.80 cm  RIGHT VENTRICLE RV Basal diam:  4.10 cm RV Mid diam:    3.90 cm RV S prime:     10.40 cm/s TAPSE (M-mode): 2.2 cm LEFT ATRIUM           Index        RIGHT ATRIUM           Index LA diam:      4.50 cm 1.96 cm/m   RA Area:     22.80 cm LA Vol (A4C): 59.7 ml 26.02 ml/m  RA Volume:   64.10 ml  27.94 ml/m  AORTIC VALVE AV Area (Vmax):    3.60 cm AV Area (Vmean):   3.41 cm AV Area (VTI):     3.67 cm AV Vmax:           94.50 cm/s AV Vmean:          65.900 cm/s AV VTI:            0.208 m AV Peak Grad:      3.6 mmHg AV Mean Grad:      2.0 mmHg LVOT Vmax:         89.60 cm/s LVOT Vmean:        59.200 cm/s LVOT VTI:          0.201 m LVOT/AV VTI ratio: 0.97  AORTA Ao Root diam: 3.50 cm Ao Asc diam:  4.10 cm TRICUSPID VALVE TR Peak grad:   28.9 mmHg TR Vmax:        269.00 cm/s  SHUNTS Systemic VTI:  0.20 m Systemic Diam: 2.20 cm Lonni Nanas MD Electronically signed by Lonni Nanas MD Signature Date/Time: 12/13/2023/11:00:03 AM    Final    DG Chest Port 1 View Result Date: 12/12/2023  CLINICAL DATA:  Shortness of breath. EXAM: PORTABLE CHEST 1 VIEW COMPARISON:  Radiographs 12/12/2023 and 12/11/2023.  CT 03/16/2008. FINDINGS: 1648 hours. There are lower lung volumes. The heart size and mediastinal contours are stable. Increased patchy opacities at both lung bases, likely atelectasis. No confluent airspace disease, definite edema, pneumothorax or significant pleural effusion. The bones appear unchanged. IMPRESSION: Lower lung volumes with  increased patchy opacities at both lung bases, likely atelectasis. Electronically Signed   By: Elsie Perone M.D.   On: 12/12/2023 16:59      Signature  -   Lavada Stank M.D on 12/14/2023 at 9:27 AM   -  To page go to [email or web address]

## 2023-12-14 NOTE — Plan of Care (Signed)
 Patient is progressing in care plan regarding his respiratory functioning. Patient refused to use Bipap at night. All medications tolerated well. Adventitious breath sounds on auscultation, expiratory wheezing. No adverse change in patient condition. Will continue to monitor.

## 2023-12-14 NOTE — Progress Notes (Signed)
 [facility] night cross cover note:   Per patient request, I ordered Benadryl  25 mg p.o. x 1 dose now to help him sleep.  The patient reports that a dose of Benadryl  last evening was helpful in getting him some rest.    Update: After melatonin milligrams, trazodone  100 mg p.o., and the aforementioned one-time dose of p.o. Benadryl , and haldol  2 mg IM, the patient continues to report refractory insomnia.  I subsequently placed an order for melatonin 10 mg p.o. x 1 dose now.     Eva Pore, DO Hospitalist

## 2023-12-15 DIAGNOSIS — U071 COVID-19: Secondary | ICD-10-CM | POA: Diagnosis not present

## 2023-12-15 LAB — COMPREHENSIVE METABOLIC PANEL WITH GFR
ALT: 13 U/L (ref 0–44)
AST: 23 U/L (ref 15–41)
Albumin: 2.7 g/dL — ABNORMAL LOW (ref 3.5–5.0)
Alkaline Phosphatase: 57 U/L (ref 38–126)
Anion gap: 10 (ref 5–15)
BUN: 56 mg/dL — ABNORMAL HIGH (ref 8–23)
CO2: 22 mmol/L (ref 22–32)
Calcium: 8.4 mg/dL — ABNORMAL LOW (ref 8.9–10.3)
Chloride: 110 mmol/L (ref 98–111)
Creatinine, Ser: 1.99 mg/dL — ABNORMAL HIGH (ref 0.61–1.24)
GFR, Estimated: 34 mL/min — ABNORMAL LOW (ref >60)
Glucose, Bld: 100 mg/dL — ABNORMAL HIGH (ref 70–99)
Potassium: 4.9 mmol/L (ref 3.5–5.1)
Sodium: 142 mmol/L (ref 135–145)
Total Bilirubin: 0.8 mg/dL (ref 0.0–1.2)
Total Protein: 6.2 g/dL — ABNORMAL LOW (ref 6.5–8.1)

## 2023-12-15 LAB — C-REACTIVE PROTEIN: CRP: 11.5 mg/dL — ABNORMAL HIGH (ref <1.0)

## 2023-12-15 LAB — CBC WITH DIFFERENTIAL/PLATELET
Abs Immature Granulocytes: 0.07 K/uL (ref 0.00–0.07)
Basophils Absolute: 0 K/uL (ref 0.0–0.1)
Basophils Relative: 0 %
Eosinophils Absolute: 0 K/uL (ref 0.0–0.5)
Eosinophils Relative: 0 %
HCT: 29.6 % — ABNORMAL LOW (ref 39.0–52.0)
Hemoglobin: 8.9 g/dL — ABNORMAL LOW (ref 13.0–17.0)
Immature Granulocytes: 1 %
Lymphocytes Relative: 15 %
Lymphs Abs: 1.9 K/uL (ref 0.7–4.0)
MCH: 27.8 pg (ref 26.0–34.0)
MCHC: 30.1 g/dL (ref 30.0–36.0)
MCV: 92.5 fL (ref 80.0–100.0)
Monocytes Absolute: 0.6 K/uL (ref 0.1–1.0)
Monocytes Relative: 5 %
Neutro Abs: 10.1 K/uL — ABNORMAL HIGH (ref 1.7–7.7)
Neutrophils Relative %: 79 %
Platelets: 147 K/uL — ABNORMAL LOW (ref 150–400)
RBC: 3.2 MIL/uL — ABNORMAL LOW (ref 4.22–5.81)
RDW: 14.1 % (ref 11.5–15.5)
WBC: 12.7 K/uL — ABNORMAL HIGH (ref 4.0–10.5)
nRBC: 0 % (ref 0.0–0.2)

## 2023-12-15 LAB — TSH: TSH: 2.768 u[IU]/mL (ref 0.350–4.500)

## 2023-12-15 LAB — T4, FREE: Free T4: 0.82 ng/dL (ref 0.61–1.12)

## 2023-12-15 LAB — PHOSPHORUS: Phosphorus: 3.4 mg/dL (ref 2.5–4.6)

## 2023-12-15 LAB — BRAIN NATRIURETIC PEPTIDE: B Natriuretic Peptide: 1013.6 pg/mL — ABNORMAL HIGH (ref 0.0–100.0)

## 2023-12-15 LAB — MAGNESIUM: Magnesium: 2.1 mg/dL (ref 1.7–2.4)

## 2023-12-15 LAB — CORTISOL: Cortisol, Plasma: 2.8 ug/dL

## 2023-12-15 LAB — PROCALCITONIN: Procalcitonin: 52.91 ng/mL

## 2023-12-15 MED ORDER — MELATONIN 5 MG PO TABS
10.0000 mg | ORAL_TABLET | Freq: Every day | ORAL | Status: DC
  Administered 2023-12-15 – 2023-12-18 (×4): 10 mg via ORAL
  Filled 2023-12-15 (×4): qty 2

## 2023-12-15 MED ORDER — DIPHENHYDRAMINE HCL 25 MG PO CAPS
25.0000 mg | ORAL_CAPSULE | Freq: Every evening | ORAL | Status: DC | PRN
  Filled 2023-12-15: qty 1

## 2023-12-15 MED ORDER — MELATONIN 5 MG PO TABS
10.0000 mg | ORAL_TABLET | Freq: Once | ORAL | Status: AC
  Administered 2023-12-15: 10 mg via ORAL
  Filled 2023-12-15: qty 2

## 2023-12-15 NOTE — Progress Notes (Signed)
 Patient complains of not being to sleep 2nd night in a row. Patient was given 2mg  haldol  IM, 100mg  trazadone, 3 mg melatonin, 25mg  Benadryl  during the 2200 hour. Two hours later patient complaining of not being able to sleep and requesting additional sleep aide medication. MD notified. Additional 10 mg of melatonin order and administered to patient. No adverse change in condition. Will continue to monitor.

## 2023-12-15 NOTE — Progress Notes (Signed)
 COVID-19  Subjective: Pt cleared by Cards per [facility] note.  Pt wishes to proceed with surgery this week.    Objective: Vital signs in last 24 hours: Temp:  [97.4 F (36.3 C)-98.1 F (36.7 C)] 97.5 F (36.4 C) (07/13 0725) Pulse Rate:  [55-77] 72 (07/13 0838) Resp:  [16-23] 18 (07/13 0725) BP: (96-144)/(57-96) 133/79 (07/13 0725) SpO2:  [94 %-97 %] 95 % (07/13 0725) Weight:  [101.9 kg] 101.9 kg (07/13 0500) Last BM Date : 12/11/23  Intake/Output from previous day: 07/12 0701 - 07/13 0700 In: 3 [I.V.:3] Out: 4650 [Urine:4650] Intake/Output this shift: No intake/output data recorded.  General appearance: alert and cooperative GI: normal findings: soft, non-tender  Lab Results:  Results for orders placed or performed during the hospital encounter of 12/11/23 (from the past 24 hours)  CBC with Differential/Platelet     Status: Abnormal   Collection Time: 12/15/23  8:21 AM  Result Value Ref Range   WBC 12.7 (H) 4.0 - 10.5 K/uL   RBC 3.20 (L) 4.22 - 5.81 MIL/uL   Hemoglobin 8.9 (L) 13.0 - 17.0 g/dL   HCT 70.3 (L) 60.9 - 47.9 %   MCV 92.5 80.0 - 100.0 fL   MCH 27.8 26.0 - 34.0 pg   MCHC 30.1 30.0 - 36.0 g/dL   RDW 85.8 88.4 - 84.4 %   Platelets 147 (L) 150 - 400 K/uL   nRBC 0.0 0.0 - 0.2 %   Neutrophils Relative % 79 %   Neutro Abs 10.1 (H) 1.7 - 7.7 K/uL   Lymphocytes Relative 15 %   Lymphs Abs 1.9 0.7 - 4.0 K/uL   Monocytes Relative 5 %   Monocytes Absolute 0.6 0.1 - 1.0 K/uL   Eosinophils Relative 0 %   Eosinophils Absolute 0.0 0.0 - 0.5 K/uL   Basophils Relative 0 %   Basophils Absolute 0.0 0.0 - 0.1 K/uL   Immature Granulocytes 1 %   Abs Immature Granulocytes 0.07 0.00 - 0.07 K/uL  Brain natriuretic peptide     Status: Abnormal   Collection Time: 12/15/23  8:21 AM  Result Value Ref Range   B Natriuretic Peptide 1,013.6 (H) 0.0 - 100.0 pg/mL     Studies/Results Radiology     MEDS, Scheduled  ARIPiprazole   2 mg Oral q AM   aspirin   324 mg Oral Once    budesonide  (PULMICORT ) nebulizer solution  0.25 mg Nebulization BID   busPIRone   10 mg Oral TID   carvedilol   6.25 mg Oral BID   Chlorhexidine  Gluconate Cloth  6 each Topical Daily   finasteride   5 mg Oral Daily   fluconazole   400 mg Oral Daily   folic acid   1 mg Oral Daily   gabapentin   100 mg Oral TID   guaiFENesin   600 mg Oral BID   heparin  injection (subcutaneous)  5,000 Units Subcutaneous Q8H   melatonin  3 mg Oral QHS   mirtazapine   30 mg Oral QHS   pravastatin   20 mg Oral QHS   predniSONE   40 mg Oral Q breakfast   primidone   50 mg Oral TID   sodium chloride  flush  3 mL Intravenous Q12H   tamsulosin   0.4 mg Oral Daily   traZODone   100 mg Oral QHS   venlafaxine  XR  150 mg Oral Daily     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  Will also need to discuss with pt's  wife.  Pt would like to go ahead with surgery and not wait until when Dr Sheldon is available.  He is aware that this would be an open operation.  Will follow up in the AM   LOS: 4 days    Bernarda JAYSON Ned, MD [facility]  Patient's medical decision making was moderate   12/15/2023 10:45 AM

## 2023-12-15 NOTE — Progress Notes (Signed)
 Patient is progressing in regards to respiratory functioning. No adventitious breath sounds on auscultation. Patient expressed that he is feeling better and feels that he no longer needs the BiPap. No expiratory wheezes. Additional sleep medication effective. No adverse change in condition. Will continue to monitor.

## 2023-12-15 NOTE — Progress Notes (Signed)
 PROGRESS NOTE        PATIENT DETAILS Name: Calvin Escobar Age: 73 y.o. Sex: male Date of Birth: Nov 09, 1950 Admit Date: 12/11/2023 Admitting Physician Maximino DELENA Sharps, MD ERE:[id number], [name], MD  Brief Summary: Patient is a 73 y.o.  male with history of colovesical fistula-numerous recent hospitalizations for sepsis related to UTI/bacteremia-just discharged from this facility on 7/5 for candidemia on oral Diflucan -presented to the hospital with fever-found to have COVID-19 infection and E. coli bacteremia.  Significant events: 7/9>> admit to [facility]-fever-COVID-19 infection/E. coli bacteremia.  Post admission-had an episode of respiratory distress/bronchospasm-started on BiPAP-transfer to progressive care. 7/10>> liberated off BiPAP-on room air-another episode of respiratory distress later in the afternoon-on BiPAP-given Lasix /steroids/bronchodilators. 7/11>> taken off BiPAP this morning-on 1-2 L of oxygen.  Calm/comfortable-not in any distress.  Significant studies: 6/30>> CT abdomen/pelvis: Marked bladder wall thickening with surrounding edema/fluid-suspected tract from sigmoid colon to posterior bladder.  Significant microbiology data: 7/9>> COVID PCR: Positive 7/9>> influenza /RSV PCR: Negative. 7/9>> blood gram culture: E Coli  Procedures: 7/3>> colonoscopy-diverticulosis, severe luminal narrowing distal sigmoid TTE   1. Left ventricular ejection fraction, by estimation, is 25 to 30%. The left ventricle has severely decreased function. The left ventricle demonstrates global hypokinesis. There is mild left ventricular hypertrophy. Left ventricular diastolic parameters  are indeterminate.  2. Right ventricular systolic function is normal. The right ventricular size is normal. There is mildly elevated pulmonary artery systolic pressure. The estimated right ventricular systolic pressure is 43.9 mmHg.  3. The mitral valve is normal in structure. Mild to moderate  mitral valve regurgitation.  4. The aortic valve is tricuspid. Aortic valve regurgitation is not visualized. No aortic stenosis is present.  5. Aortic dilatation noted. There is dilatation of the ascending aorta, measuring 41 mm.  6. The inferior vena cava is dilated in size with <50% respiratory variability, suggesting right atrial pressure of 15 mmHg  Consults: ID Cards CCS  Subjective:  Patient in bed, appears comfortable, denies any headache, no fever, no chest pain or pressure, no shortness of breath , no abdominal pain. No new focal weakness.    Objective: Vitals: Blood pressure 133/79, pulse 72, temperature (!) 97.5 F (36.4 C), temperature source Oral, resp. rate 18, weight 101.9 kg, SpO2 95%.   Exam:  Awake Alert, No new F.N deficits, Normal affect [place].AT,PERRAL Supple Neck, No JVD,   Symmetrical Chest wall movement, Good air movement bilaterally, CTAB RRR,No Gallops, Rubs or new Murmurs,  +ve B.Sounds, Abd Soft, No tenderness,   No Cyanosis, Clubbing or edema    Assessment/Plan:  Severe sepsis secondary to polymicrobial bloodstream infection (candidemia on 6/18 and E. coli bacteremia 7/9)-present on admission (fever/tachypnea/tachycardia/leukocytosis with evidence of AKI) Sepsis physiology has resolved-leukocytosis is likely from steroid. Polymicrobial bloodstream infection-likely secondary to symptomatic chronic colovesical fistula Already on fluconazole  from prior admission, now on IV Rocephin . ID and CCS following, may require definitive surgery will defer that to general surgery, sepsis pathophysiology has improved.  If needed patient has been cleared by cardiology for surgical procedure on 12/13/2023.  Communicated that to general surgery as well, they will see the patient and formulate a plan.  COVID-19 infection Most likely incidental monitor.  No signs of clinical pneumonitis.  AKI on CKD stage IIIa with hyperkalemia AKI likely hemodynamic mediated in the  setting of sepsis-possible CHF. Improved with supportive care and diuretics.  Received Lokelma  for hyperkalemia which has improved as well.  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  Required IV diuretics and BiPAP, now stable on room air on 12/14/2023, continue supportive care, cardiology following.  Currently on Coreg  monitor, as needed Lasix .  Normocytic anemia Due to combination of acute illness/ongoing CKD Follow CBC  Elevated D-dimer.  Likely due to sepsis.  Negative venous duplex no chest pain, currently no hypoxia, echo with relatively stable right-sided physiology clinically no PE.    Incidental finding of right popliteal cyst.  Pain-free.  Outpatient orthopedics follow-up.    History of colovesical fistula Likely cause of recurrent infections/bacteremia/candidemia S/p recent colonoscopy-with sigmoid stricture-suspected to be the site of fistula. CCS on board as above  History of BPH History of urethral stricture-s/p dilated by Dr. Alvaro on 6/28-with chronic indwelling Foley catheter Recommendations by urology (consult note on 6/28) were to keep Foley catheter in place-monthly changes recommended.  History of mood disorder Stable Abilify /BuSpar /trazodone /Effexor   History of intention tremor Primidone .  Class 1 Obesity: BMI of 30.  Follow-up with PCP for weight loss.    Code status:   Code Status: Do not attempt resuscitation (DNR) PRE-ARREST INTERVENTIONS DESIRED   DVT Prophylaxis: heparin  injection 5,000 Units Start: 12/12/23 1400 SCDs Start: 12/11/23 1741   Family Communication: Spouse-[name] Leather-[phone] updated 7/10   Disposition Plan: Status is: Inpatient Remains inpatient appropriate because: Severity of illness   Planned Discharge Destination:Home health   Diet: Diet Order             Diet Heart Room service appropriate? Yes; Fluid consistency: Thin  Diet effective now                      Data Review:   Inpatient Medications  Scheduled Meds:  ARIPiprazole   2 mg Oral q AM   aspirin   324 mg Oral Once   budesonide  (PULMICORT ) nebulizer solution  0.25 mg Nebulization BID   busPIRone   10 mg Oral TID   carvedilol   6.25 mg Oral BID   Chlorhexidine  Gluconate Cloth  6 each Topical Daily   finasteride   5 mg Oral Daily   fluconazole   400 mg Oral Daily   folic acid   1 mg Oral Daily   gabapentin   100 mg Oral TID   guaiFENesin   600 mg Oral BID   heparin  injection (subcutaneous)  5,000 Units Subcutaneous Q8H   melatonin  3 mg Oral QHS   mirtazapine   30 mg Oral QHS   pravastatin   20 mg Oral QHS   predniSONE   40 mg Oral Q breakfast   primidone   50 mg Oral TID   sodium chloride  flush  3 mL Intravenous Q12H   tamsulosin   0.4 mg Oral Daily   traZODone   100 mg Oral QHS   venlafaxine  XR  150 mg Oral Daily   Continuous Infusions:  cefTRIAXone  (ROCEPHIN )  IV 2 g (12/15/23 0618)   PRN Meds:.acetaminophen  **OR** acetaminophen , haloperidol  lactate, levalbuterol     Recent Labs  Lab 12/11/23 1344 12/11/23 1346 12/11/23 1834 12/12/23 0636 12/13/23 0657 12/14/23 0600 12/15/23 0821  WBC 10.8*  --   --  9.1 23.5* 18.3* 12.7*  HGB 8.2*   < > 8.6* 8.3* 7.2* 7.5* 8.9*  HCT 26.7*   < > 28.5* 26.9* 23.7* 24.0* 29.6*  PLT 211  --   --  167 116* 131* 147*  MCV 93.7  --   --  93.7 94.0 91.6  92.5  MCH 28.8  --   --  28.9 28.6 28.6 27.8  MCHC 30.7  --   --  30.9 30.4 31.3 30.1  RDW 14.7  --   --  14.6 14.6 14.2 14.1  LYMPHSABS 0.8  --   --   --   --  0.7 1.9  MONOABS 1.6*  --   --   --   --  0.8 0.6  EOSABS 0.1  --   --   --   --  0.0 0.0  BASOSABS 0.1  --   --   --   --  0.0 0.0   < > = values in this interval not displayed.    Recent Labs  Lab 12/11/23 1330 12/11/23 1344 12/11/23 1346 12/11/23 1347 12/12/23 0631 12/12/23 0636 12/12/23 1754 12/13/23 0657 12/14/23 0600 12/15/23 0821  NA  --  136 135  134*  --   --  137  --  138 137  --   K  --   5.9* 5.8*  5.9*  --   --  5.2*  --  5.0 4.4  --   CL  --  102 102  --   --  104  --  106 105  --   CO2  --  21*  --   --   --  23  --  21* 20*  --   ANIONGAP  --  13  --   --   --  10  --  11 12  --   GLUCOSE  --  113* 111*  --   --  166*  --  184* 144*  --   BUN  --  25* 27*  --   --  32*  --  52* 66*  --   CREATININE  --  2.40* 2.50*  --   --  2.48*  --  2.75* 2.41*  --   AST  --  15  --   --   --   --   --   --  21  --   ALT  --  9  --   --   --   --   --   --  14  --   ALKPHOS  --  59  --   --   --   --   --   --  54  --   BILITOT  --  0.3  --   --   --   --   --   --  0.5  --   ALBUMIN   --  2.9*  --   --   --   --   --   --  2.5*  --   CRP  --   --   --   --  22.8*  --   --  22.7* 19.3*  --   DDIMER  --   --   --   --   --   --   --  3.96*  --   --   PROCALCITON  --   --   --   --  4.66  --   --  >150.00 114.80  --   LATICACIDVEN  --   --   --  1.2  --   --   --   --   --   --   INR  --  1.2  --   --   --   --   --   --   --   --  BNP 455.8*  --   --   --   --   --  731.8* 629.9* 712.2* 1,013.6*  MG  --   --   --   --   --   --   --   --  2.0  --   PHOS  --   --   --   --   --   --   --   --  5.3*  --   CALCIUM   --  8.5*  --   --   --  8.5*  --  8.0* 7.9*  --       Recent Labs  Lab 12/11/23 1330 12/11/23 1344 12/11/23 1347 12/12/23 0631 12/12/23 0636 12/12/23 1754 12/13/23 0657 12/14/23 0600 12/15/23 0821  CRP  --   --   --  22.8*  --   --  22.7* 19.3*  --   DDIMER  --   --   --   --   --   --  3.96*  --   --   PROCALCITON  --   --   --  4.66  --   --  >150.00 114.80  --   LATICACIDVEN  --   --  1.2  --   --   --   --   --   --   INR  --  1.2  --   --   --   --   --   --   --   BNP 455.8*  --   --   --   --  731.8* 629.9* 712.2* 1,013.6*  MG  --   --   --   --   --   --   --  2.0  --   CALCIUM   --  8.5*  --   --  8.5*  --  8.0* 7.9*  --     --------------------------------------------------------------------------------------------------------------- Lab Results   Component Value Date   CHOL 118 08/14/2023   HDL 39 (L) 08/14/2023   LDLCALC 54 08/14/2023   TRIG 168 (H) 08/14/2023   CHOLHDL 3.0 08/14/2023    Lab Results  Component Value Date   HGBA1C 5.3 08/14/2023   No results for input(s): TSH, T4TOTAL, FREET4, T3FREE, THYROIDAB in the last 72 hours. No results for input(s): VITAMINB12, FOLATE, FERRITIN, TIBC, IRON, RETICCTPCT in the last 72 hours. ------------------------------------------------------------------------------------------------------------------ Cardiac Enzymes No results for input(s): CKMB, TROPONINI, MYOGLOBIN in the last 168 hours.  Invalid input(s): CK  Micro Results Recent Results (from the past 240 hours)  Resp panel by RT-PCR (RSV, Flu A&B, Covid) Anterior Nasal Swab     Status: Abnormal   Collection Time: 12/11/23 12:29 PM   Specimen: Anterior Nasal Swab  Result Value Ref Range Status   SARS Coronavirus 2 by RT PCR POSITIVE (A) NEGATIVE Final   Influenza A by PCR NEGATIVE NEGATIVE Final   Influenza B by PCR NEGATIVE NEGATIVE Final    Comment: (NOTE) The Xpert Xpress SARS-CoV-2/FLU/RSV plus assay is intended as an aid in the diagnosis of influenza from Nasopharyngeal swab specimens and should not be used as a sole basis for treatment. Nasal washings and aspirates are unacceptable for Xpert Xpress SARS-CoV-2/FLU/RSV testing.  Fact Sheet for Patients: BloggerCourse.com  Fact Sheet for Healthcare Providers: SeriousBroker.it  This test is not yet approved or cleared by the [place]  FDA and has been authorized for detection and/or diagnosis of SARS-CoV-2 by FDA under an Emergency Use Authorization (EUA). This EUA will  remain in effect (meaning this test can be used) for the duration of the COVID-19 declaration under Section 564(b)(1) of the Act, 21 U.S.C. section 360bbb-3(b)(1), unless the authorization is terminated  or revoked.     Resp Syncytial Virus by PCR NEGATIVE NEGATIVE Final    Comment: (NOTE) Fact Sheet for Patients: BloggerCourse.com  Fact Sheet for Healthcare Providers: SeriousBroker.it  This test is not yet approved or cleared by the [place]  FDA and has been authorized for detection and/or diagnosis of SARS-CoV-2 by FDA under an Emergency Use Authorization (EUA). This EUA will remain in effect (meaning this test can be used) for the duration of the COVID-19 declaration under Section 564(b)(1) of the Act, 21 U.S.C. section 360bbb-3(b)(1), unless the authorization is terminated or revoked.  Performed at [facility], [address] [place], [place] [address]   Blood Culture (routine x 2)     Status: Abnormal   Collection Time: 12/11/23  1:30 PM   Specimen: BLOOD RIGHT FOREARM  Result Value Ref Range Status   Specimen Description BLOOD RIGHT FOREARM  Final   Special Requests   Final    BOTTLES DRAWN AEROBIC AND ANAEROBIC Blood Culture results may not be optimal due to an inadequate volume of blood received in culture bottles   Culture  Setup Time   Final    GRAM NEGATIVE RODS IN BOTH AEROBIC AND ANAEROBIC BOTTLES CRITICAL RESULT CALLED TO, READ BACK BY AND VERIFIED WITH: MAYA AGENT LEDFORD [phone] [id number] BY JINNY COMMON, MT Performed at [facility], [address] [place], [place] [address]    Culture ESCHERICHIA COLI (A)  Final   Report Status 12/14/2023 FINAL  Final   Organism ID, Bacteria ESCHERICHIA COLI  Final   Organism ID, Bacteria ESCHERICHIA COLI  Final      Susceptibility   Escherichia coli - KIRBY BAUER*    CEFAZOLIN SENSITIVE Sensitive    Escherichia coli - MIC*    AMPICILLIN  <=2 SENSITIVE Sensitive     CEFEPIME  <=0.12 SENSITIVE Sensitive     CEFTAZIDIME <=1 SENSITIVE Sensitive     CEFTRIAXONE  <=0.25 SENSITIVE Sensitive     CIPROFLOXACIN <=0.25 SENSITIVE Sensitive     GENTAMICIN <=1 SENSITIVE  Sensitive     IMIPENEM <=0.25 SENSITIVE Sensitive     TRIMETH/SULFA <=20 SENSITIVE Sensitive     AMPICILLIN /SULBACTAM <=2 SENSITIVE Sensitive     PIP/TAZO <=4 SENSITIVE Sensitive ug/mL    * ESCHERICHIA COLI    ESCHERICHIA COLI  Blood Culture ID Panel (Reflexed)     Status: Abnormal   Collection Time: 12/11/23  1:30 PM  Result Value Ref Range Status   Enterococcus faecalis NOT DETECTED NOT DETECTED Final   Enterococcus Faecium NOT DETECTED NOT DETECTED Final   Listeria monocytogenes NOT DETECTED NOT DETECTED Final   Staphylococcus species NOT DETECTED NOT DETECTED Final   Staphylococcus aureus (BCID) NOT DETECTED NOT DETECTED Final   Staphylococcus epidermidis NOT DETECTED NOT DETECTED Final   Staphylococcus lugdunensis NOT DETECTED NOT DETECTED Final   Streptococcus species NOT DETECTED NOT DETECTED Final   Streptococcus agalactiae NOT DETECTED NOT DETECTED Final   Streptococcus pneumoniae NOT DETECTED NOT DETECTED Final   Streptococcus pyogenes NOT DETECTED NOT DETECTED Final   A.calcoaceticus-baumannii NOT DETECTED NOT DETECTED Final   Bacteroides fragilis NOT DETECTED NOT DETECTED Final   Enterobacterales DETECTED (A) NOT DETECTED Final    Comment: Enterobacterales represent a large order of gram negative bacteria, not a single organism. CRITICAL RESULT CALLED TO, READ BACK  BY AND VERIFIED WITH: PHARMD JAMES LEDFORD [id number] BY J RAZZAK, MT    Enterobacter cloacae complex NOT DETECTED NOT DETECTED Final   Escherichia coli DETECTED (A) NOT DETECTED Final    Comment: CRITICAL RESULT CALLED TO, READ BACK BY AND VERIFIED WITH: PHARMD JAMES LEDFORD [phone] [id number] BY J RAZZAK, MT    Klebsiella aerogenes NOT DETECTED NOT DETECTED Final   Klebsiella oxytoca NOT DETECTED NOT DETECTED Final   Klebsiella pneumoniae NOT DETECTED NOT DETECTED Final   Proteus species NOT DETECTED NOT DETECTED Final   Salmonella species NOT DETECTED NOT DETECTED Final   Serratia marcescens NOT DETECTED  NOT DETECTED Final   Haemophilus influenzae NOT DETECTED NOT DETECTED Final   Neisseria meningitidis NOT DETECTED NOT DETECTED Final   Pseudomonas aeruginosa NOT DETECTED NOT DETECTED Final   Stenotrophomonas maltophilia NOT DETECTED NOT DETECTED Final   Candida albicans NOT DETECTED NOT DETECTED Final   Candida auris NOT DETECTED NOT DETECTED Final   Candida glabrata NOT DETECTED NOT DETECTED Final   Candida krusei NOT DETECTED NOT DETECTED Final   Candida parapsilosis NOT DETECTED NOT DETECTED Final   Candida tropicalis NOT DETECTED NOT DETECTED Final   Cryptococcus neoformans/gattii NOT DETECTED NOT DETECTED Final   CTX-M ESBL NOT DETECTED NOT DETECTED Final   Carbapenem resistance IMP NOT DETECTED NOT DETECTED Final   Carbapenem resistance KPC NOT DETECTED NOT DETECTED Final   Carbapenem resistance NDM NOT DETECTED NOT DETECTED Final   Carbapenem resist OXA 48 LIKE NOT DETECTED NOT DETECTED Final   Carbapenem resistance VIM NOT DETECTED NOT DETECTED Final    Comment: Performed at [facility], [address], [place], [place] [address]  Blood Culture (routine x 2)     Status: None (Preliminary result)   Collection Time: 12/11/23  6:30 PM   Specimen: BLOOD  Result Value Ref Range Status   Specimen Description BLOOD SITE NOT SPECIFIED  Final   Special Requests   Final    BOTTLES DRAWN AEROBIC AND ANAEROBIC Blood Culture adequate volume   Culture   Final    NO GROWTH 4 DAYS Performed at [facility], [address] [place], [place] [address]    Report Status PENDING  Incomplete    Radiology Reports  VAS US  LOWER EXTREMITY VENOUS (DVT) Result Date: 12/14/2023  Lower Venous DVT Study Patient Name:  ERIBERTO FELCH [facility]  Date of Exam:   12/14/2023 Medical Rec #: [id number]           Accession #:    [id number] Date of Birth: 14-May-1951           Patient Gender: M Patient Age:   73 years Exam Location:  [facility] Procedure:      VAS US  LOWER EXTREMITY VENOUS (DVT)  Referring Phys: DONALDA APPLEBAUM --------------------------------------------------------------------------------  Indications: Swelling, SOB, and Covid.  Risk Factors: HFrEF, NICM, CHF, history of candidemia during prior admission 12/02/23-12/07/2023, readmission for fever, tachycardia and new Ecoli and Covid infection. AKI, CKD IIIa. Limitations: Poor ultrasound/tissue interface and Patient sleeping with restless legs. Comparison Study: No prior study on file Performing Technologist: Alberta Lis RVS  Examination Guidelines: A complete evaluation includes B-mode imaging, spectral Doppler, color Doppler, and power Doppler as needed of all accessible portions of each vessel. Bilateral testing is considered an integral part of a complete examination. Limited examinations for reoccurring indications may be performed as noted. The reflux portion of the exam is performed with the patient in reverse Trendelenburg.  +---------+---------------+---------+-----------+----------+--------------+  RIGHT    CompressibilityPhasicitySpontaneityPropertiesThrombus Aging +---------+---------------+---------+-----------+----------+--------------+ CFV      Full           Yes      No                                  +---------+---------------+---------+-----------+----------+--------------+ SFJ      Full                                                        +---------+---------------+---------+-----------+----------+--------------+ FV Prox  Full           Yes      No                                  +---------+---------------+---------+-----------+----------+--------------+ FV Mid   Full           Yes      No                                  +---------+---------------+---------+-----------+----------+--------------+ FV DistalFull           Yes      No                                  +---------+---------------+---------+-----------+----------+--------------+ PFV      Full           Yes      No                                   +---------+---------------+---------+-----------+----------+--------------+ POP      Full           Yes      No                                  +---------+---------------+---------+-----------+----------+--------------+ PTV      Full                                                        +---------+---------------+---------+-----------+----------+--------------+ PERO     Full                                                        +---------+---------------+---------+-----------+----------+--------------+ Gastroc  Full                                                        +---------+---------------+---------+-----------+----------+--------------+ Large cystic structure containing mixed echoes noted  in the popliteal fossa measuring 4.35 x 2.81 cm.  +---------+---------------+---------+-----------+----------+--------------+ LEFT     CompressibilityPhasicitySpontaneityPropertiesThrombus Aging +---------+---------------+---------+-----------+----------+--------------+ CFV      Full           Yes      No                                  +---------+---------------+---------+-----------+----------+--------------+ SFJ      Full                                                        +---------+---------------+---------+-----------+----------+--------------+ FV Prox  Full           Yes      No                                  +---------+---------------+---------+-----------+----------+--------------+ FV Mid   Full                                                        +---------+---------------+---------+-----------+----------+--------------+ FV DistalFull           Yes      No                                  +---------+---------------+---------+-----------+----------+--------------+ PFV      Full                                                         +---------+---------------+---------+-----------+----------+--------------+ POP      Full           Yes      No                                  +---------+---------------+---------+-----------+----------+--------------+ PTV      Full                                                        +---------+---------------+---------+-----------+----------+--------------+ PERO     Full                                                        +---------+---------------+---------+-----------+----------+--------------+     Summary: BILATERAL: - No obvious evidence of deep 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 - RIGHT: - Large cystic structure containing mixed echoes noted in the popliteal fossa measuring 4.35 x 2.81 cm.   *See table(s) above  for measurements and observations.    Preliminary    DG Chest Port 1 View Result Date: 12/14/2023 CLINICAL DATA:  Shortness of breath EXAM: PORTABLE CHEST 1 VIEW COMPARISON:  12/12/2023 FINDINGS: 0.6 cm right mid lung nodule no change from 2017, considered benign. Low lung volumes are present, causing crowding of the pulmonary vasculature. Lower thoracic spondylosis. New hazy density peripherally at the left lung base may represent superimposition of osseous shadows, although early pneumonia or atelectasis could have a similar appearance. This could be in the lingula or left lower lobe. Degenerative glenohumeral arthropathy, left greater than right. IMPRESSION: 1. New hazy density peripherally at the left lung base may represent superimposition of osseous shadows, although early pneumonia or atelectasis could have a similar appearance. This could be in the lingula or left lower lobe. 2. Low lung volumes are present, causing crowding of the pulmonary vasculature. 3. Lower thoracic spondylosis. 4. Degenerative glenohumeral arthropathy, left greater than right. Electronically Signed   By: Ryan Salvage M.D.   On: 12/14/2023 10:37    ECHOCARDIOGRAM COMPLETE Result Date: 12/13/2023    ECHOCARDIOGRAM REPORT   Patient Name:   DAITON COWLES [facility] Date of Exam: 12/13/2023 Medical Rec #:  [id number]          Height:       72.0 in Accession #:    [id number]         Weight:       238.1 lb Date of Birth:  1951/01/25          BSA:          2.294 m Patient Age:    73 years           BP:           91/60 mmHg Patient Gender: M                  HR:           63 bpm. Exam Location:  Inpatient Procedure: 2D Echo, Color Doppler and Cardiac Doppler (Both Spectral and Color            Flow Doppler were utilized during procedure). Indications:    Dyspnea  History:        Patient has prior history of Echocardiogram examinations, most                 recent 05/23/2023. Risk Factors:HDL.  Sonographer:    Benard Stallion Referring Phys: [id number] DONALDA M GHIMIRE IMPRESSIONS  1. Left ventricular ejection fraction, by estimation, is 25 to 30%. The left ventricle has severely decreased function. The left ventricle demonstrates global hypokinesis. There is mild left ventricular hypertrophy. Left ventricular diastolic parameters  are indeterminate.  2. Right ventricular systolic function is normal. The right ventricular size is normal. There is mildly elevated pulmonary artery systolic pressure. The estimated right ventricular systolic pressure is 43.9 mmHg.  3. The mitral valve is normal in structure. Mild to moderate mitral valve regurgitation.  4. The aortic valve is tricuspid. Aortic valve regurgitation is not visualized. No aortic stenosis is present.  5. Aortic dilatation noted. There is dilatation of the ascending aorta, measuring 41 mm.  6. The inferior vena cava is dilated in size with <50% respiratory variability, suggesting right atrial pressure of 15 mmHg. FINDINGS  Left Ventricle: Left ventricular ejection fraction, by estimation, is 25 to 30%. The left ventricle has severely decreased function. The left ventricle demonstrates global hypokinesis. The left  ventricular internal cavity size was  normal in size. There is mild left ventricular hypertrophy. Left ventricular diastolic parameters are indeterminate. Right Ventricle: The right ventricular size is normal. No increase in right ventricular wall thickness. Right ventricular systolic function is normal. There is mildly elevated pulmonary artery systolic pressure. The tricuspid regurgitant velocity is 2.69  m/s, and with an assumed right atrial pressure of 15 mmHg, the estimated right ventricular systolic pressure is 43.9 mmHg. Left Atrium: Left atrial size was normal in size. Right Atrium: Right atrial size was normal in size. Pericardium: There is no evidence of pericardial effusion. Mitral Valve: The mitral valve is normal in structure. Mild to moderate mitral valve regurgitation. Tricuspid Valve: The tricuspid valve is normal in structure. Tricuspid valve regurgitation is mild. Aortic Valve: The aortic valve is tricuspid. Aortic valve regurgitation is not visualized. No aortic stenosis is present. Aortic valve mean gradient measures 2.0 mmHg. Aortic valve peak gradient measures 3.6 mmHg. Aortic valve area, by VTI measures 3.67 cm. Pulmonic Valve: The pulmonic valve was not well visualized. Pulmonic valve regurgitation is trivial. Aorta: The aortic root is normal in size and structure and aortic dilatation noted. There is dilatation of the ascending aorta, measuring 41 mm. Venous: The inferior vena cava is dilated in size with less than 50% respiratory variability, suggesting right atrial pressure of 15 mmHg. IAS/Shunts: The interatrial septum was not well visualized.  LEFT VENTRICLE PLAX 2D LVIDd:         5.40 cm   Diastology LVIDs:         4.90 cm   LV e' medial:  5.33 cm/s LV PW:         1.10 cm   LV e' lateral: 7.40 cm/s LV IVS:        1.10 cm LVOT diam:     2.20 cm LV SV:         76 LV SV Index:   33 LVOT Area:     3.80 cm  RIGHT VENTRICLE RV Basal diam:  4.10 cm RV Mid diam:    3.90 cm RV S prime:      10.40 cm/s TAPSE (M-mode): 2.2 cm LEFT ATRIUM           Index        RIGHT ATRIUM           Index LA diam:      4.50 cm 1.96 cm/m   RA Area:     22.80 cm LA Vol (A4C): 59.7 ml 26.02 ml/m  RA Volume:   64.10 ml  27.94 ml/m  AORTIC VALVE AV Area (Vmax):    3.60 cm AV Area (Vmean):   3.41 cm AV Area (VTI):     3.67 cm AV Vmax:           94.50 cm/s AV Vmean:          65.900 cm/s AV VTI:            0.208 m AV Peak Grad:      3.6 mmHg AV Mean Grad:      2.0 mmHg LVOT Vmax:         89.60 cm/s LVOT Vmean:        59.200 cm/s LVOT VTI:          0.201 m LVOT/AV VTI ratio: 0.97  AORTA Ao Root diam: 3.50 cm Ao Asc diam:  4.10 cm TRICUSPID VALVE TR Peak grad:   28.9 mmHg TR Vmax:        269.00 cm/s  SHUNTS Systemic VTI:  0.20 m Systemic Diam: 2.20 cm Lonni Nanas MD Electronically signed by Lonni Nanas MD Signature Date/Time: 12/13/2023/11:00:03 AM    Final       Signature  -   Lavada Stank M.D on 12/15/2023 at 9:59 AM   -  To page go to [email or web address]

## 2023-12-15 NOTE — Progress Notes (Signed)
   12/15/23 2243  BiPAP/CPAP/SIPAP  BiPAP/CPAP/SIPAP Pt Type Adult  BiPAP/CPAP/SIPAP SERVO  Reason BIPAP/CPAP not in use Non-compliant  BiPAP/CPAP /SiPAP Vitals  Resp 18  MEWS Score/Color  MEWS Score 0  MEWS Score Color Landy

## 2023-12-15 NOTE — Progress Notes (Signed)

## 2023-12-16 ENCOUNTER — Inpatient Hospital Stay: Admitting: Internal Medicine

## 2023-12-16 DIAGNOSIS — U071 COVID-19: Secondary | ICD-10-CM | POA: Diagnosis not present

## 2023-12-16 LAB — COMPREHENSIVE METABOLIC PANEL WITH GFR
ALT: 12 U/L (ref 0–44)
AST: 17 U/L (ref 15–41)
Albumin: 2.6 g/dL — ABNORMAL LOW (ref 3.5–5.0)
Alkaline Phosphatase: 53 U/L (ref 38–126)
Anion gap: 7 (ref 5–15)
BUN: 43 mg/dL — ABNORMAL HIGH (ref 8–23)
CO2: 27 mmol/L (ref 22–32)
Calcium: 8.5 mg/dL — ABNORMAL LOW (ref 8.9–10.3)
Chloride: 109 mmol/L (ref 98–111)
Creatinine, Ser: 1.85 mg/dL — ABNORMAL HIGH (ref 0.61–1.24)
GFR, Estimated: 38 mL/min — ABNORMAL LOW (ref >60)
Glucose, Bld: 99 mg/dL (ref 70–99)
Potassium: 4.7 mmol/L (ref 3.5–5.1)
Sodium: 143 mmol/L (ref 135–145)
Total Bilirubin: 0.5 mg/dL (ref 0.0–1.2)
Total Protein: 6 g/dL — ABNORMAL LOW (ref 6.5–8.1)

## 2023-12-16 LAB — CBC WITH DIFFERENTIAL/PLATELET
Abs Immature Granulocytes: 0.12 K/uL — ABNORMAL HIGH (ref 0.00–0.07)
Basophils Absolute: 0 K/uL (ref 0.0–0.1)
Basophils Relative: 0 %
Eosinophils Absolute: 0.1 K/uL (ref 0.0–0.5)
Eosinophils Relative: 1 %
HCT: 27.6 % — ABNORMAL LOW (ref 39.0–52.0)
Hemoglobin: 8.4 g/dL — ABNORMAL LOW (ref 13.0–17.0)
Immature Granulocytes: 1 %
Lymphocytes Relative: 22 %
Lymphs Abs: 2.1 K/uL (ref 0.7–4.0)
MCH: 28.3 pg (ref 26.0–34.0)
MCHC: 30.4 g/dL (ref 30.0–36.0)
MCV: 92.9 fL (ref 80.0–100.0)
Monocytes Absolute: 0.4 K/uL (ref 0.1–1.0)
Monocytes Relative: 5 %
Neutro Abs: 6.7 K/uL (ref 1.7–7.7)
Neutrophils Relative %: 71 %
Platelets: 151 K/uL (ref 150–400)
RBC: 2.97 MIL/uL — ABNORMAL LOW (ref 4.22–5.81)
RDW: 14 % (ref 11.5–15.5)
WBC: 9.4 K/uL (ref 4.0–10.5)
nRBC: 0.2 % (ref 0.0–0.2)

## 2023-12-16 LAB — BRAIN NATRIURETIC PEPTIDE: B Natriuretic Peptide: 868 pg/mL — ABNORMAL HIGH (ref 0.0–100.0)

## 2023-12-16 LAB — PROCALCITONIN: Procalcitonin: 22.87 ng/mL

## 2023-12-16 LAB — MAGNESIUM: Magnesium: 2 mg/dL (ref 1.7–2.4)

## 2023-12-16 LAB — C-REACTIVE PROTEIN: CRP: 8.3 mg/dL — ABNORMAL HIGH (ref <1.0)

## 2023-12-16 LAB — CULTURE, BLOOD (ROUTINE X 2)
Culture: NO GROWTH
Special Requests: ADEQUATE

## 2023-12-16 LAB — PHOSPHORUS: Phosphorus: 3.1 mg/dL (ref 2.5–4.6)

## 2023-12-16 MED ORDER — FUROSEMIDE 10 MG/ML IJ SOLN
20.0000 mg | Freq: Once | INTRAMUSCULAR | Status: DC
  Administered 2023-12-16: 20 mg via INTRAVENOUS

## 2023-12-16 MED ORDER — IPRATROPIUM-ALBUTEROL 0.5-2.5 (3) MG/3ML IN SOLN
3.0000 mL | Freq: Once | RESPIRATORY_TRACT | Status: AC
  Administered 2023-12-16: 3 mL via RESPIRATORY_TRACT
  Filled 2023-12-16: qty 3

## 2023-12-16 MED ORDER — FUROSEMIDE 10 MG/ML IJ SOLN: 20.0000 mg | Freq: Once | INTRAMUSCULAR | Status: DC

## 2023-12-16 MED ORDER — ZOLPIDEM TARTRATE 5 MG PO TABS: 5.0000 mg | ORAL_TABLET | Freq: Every evening | ORAL | Status: DC | PRN

## 2023-12-16 MED ORDER — FUROSEMIDE 10 MG/ML IJ SOLN
INTRAMUSCULAR | Status: AC
  Administered 2023-12-16: 40 mg via INTRAVENOUS
  Filled 2023-12-16: qty 4

## 2023-12-16 MED ORDER — PREDNISONE 5 MG PO TABS
10.0000 mg | ORAL_TABLET | Freq: Every day | ORAL | Status: AC
  Administered 2023-12-17 – 2023-12-18 (×2): 10 mg via ORAL
  Filled 2023-12-16 (×2): qty 2

## 2023-12-16 MED ORDER — FUROSEMIDE 10 MG/ML IJ SOLN
40.0000 mg | Freq: Once | INTRAMUSCULAR | Status: AC
  Filled 2023-12-16: qty 4

## 2023-12-16 MED ORDER — PREDNISONE 20 MG PO TABS
40.0000 mg | ORAL_TABLET | Freq: Once | ORAL | Status: AC
  Administered 2023-12-16: 40 mg via ORAL
  Filled 2023-12-16: qty 2

## 2023-12-16 NOTE — Progress Notes (Signed)
LATE ENTRY

## 2023-12-16 NOTE — Progress Notes (Signed)
 Heart Failure Navigator Progress Note  Assessed for Heart & Vascular TOC clinic readiness.  Patient does not meet criteria due to per MD note patient with history of Dementia. No HF TOC. .   Navigator will sign off at this time.   Randie Bustle, BSN, Scientist, clinical (histocompatibility and immunogenetics) Only

## 2023-12-16 NOTE — TOC Progression Note (Signed)
 Transition of Care [facility]) - Progression Note    Patient Details  Name: Calvin Escobar MRN: [id number] Date of Birth: 08/03/1950  [facility] CM/SW Contact  Robynn Eileen Hoose, RN Phone Number: 12/16/2023, 2:16 PM  Clinical Narrative:   Progression rounds update: Surgery for fistula postponed. Patient expected to be discharged in 4-5 days. Spoke with patient by phone, patient confirmed not currently on oxygen and would like to continue [facility] services with [facility]. Kelly from [place] updated.    Expected Discharge Plan: Home w Home Health Services Barriers to Discharge: Continued Medical Work up  Expected Discharge Plan and Services       Living arrangements for the past 2 months: Single Family Home                           HH Arranged: PT, OT HH Agency: [facility] Date [facility] Agency Contacted: 12/16/23 Time HH Agency Contacted: 1413 Representative spoke with at [facility] Agency: [place] (Awaiting acceptance)   Social Determinants of Health (SDOH) Interventions SDOH Screenings   Food Insecurity: No Food Insecurity (12/11/2023)  Housing: Low Risk  (12/11/2023)  Transportation Needs: No Transportation Needs (12/11/2023)  Utilities: Not At Risk (12/11/2023)  Alcohol Screen: Low Risk  (10/10/2023)  Depression (PHQ2-9): Low Risk  (12/09/2023)  Recent Concern: Depression (PHQ2-9) - Medium Risk (09/16/2023)  Financial Resource Strain: Low Risk  (10/10/2023)  Physical Activity: Sufficiently Active (10/10/2023)  Social Connections: Socially Isolated (12/11/2023)  Stress: No Stress Concern Present (10/10/2023)  Tobacco Use: Low Risk  (12/05/2023)  Health Literacy: Adequate Health Literacy (10/10/2023)    Readmission Risk Interventions    07/04/2023    1:27 PM 07/03/2023    2:48 PM 06/05/2023   10:12 AM  Readmission Risk Prevention Plan  Transportation Screening Complete Complete Complete  PCP or Specialist Appt within 5-7 Days Complete Complete   PCP or Specialist Appt within  3-5 Days   Complete  Home Care Screening Complete Complete   Medication Review (RN CM) Complete Complete   HRI or Home Care Consult   Complete  Social Work Consult for Recovery Care Planning/Counseling   Complete  Palliative Care Screening   Complete  Medication Review Oceanographer)   Complete

## 2023-12-16 NOTE — Progress Notes (Signed)
   12/16/23 2000  BiPAP/CPAP/SIPAP  Reason BIPAP/CPAP not in use Non-compliant  BiPAP/CPAP /SiPAP Vitals  Pulse Rate 86  Resp (!) 26  BP (!) 155/95  SpO2 93 %  MEWS Score/Color  MEWS Score 2  MEWS Score Color Yellow   Pt refused cpap its on standby

## 2023-12-16 NOTE — Progress Notes (Signed)
 Physical Therapy Treatment Patient Details Name: Calvin Escobar MRN: [id number] DOB: 08/08/1950 Today's Date: 12/16/2023   History of Present Illness 73 y.o. male presents to [facility] on 7/9 with tachycardia, tachypnea and cough. + COVID and ecoli bacteremia. PMH: recent admission for hematuria--went home with foley catheter, hyperlipidemia, systolic heart failure, history of colovesical fistula urinary retention anemia of chronic disease, bipolar disorder, dementia.    PT Comments  Pt seen for PT tx with pt agreeable. Pt is able to progress gait distances on this date but does require use of RW to increase balance with mobility as pt with poor balance when attempting gait without BUE support. PT educated pt on use of incentive spirometer & pt would benefit from continued education. Will continue to follow pt acutely to progress balance, gait with LRAD & stair negotiation.   If plan is discharge home, recommend the following: A little help with walking and/or transfers;A little help with bathing/dressing/bathroom;Assistance with cooking/housework;Direct supervision/assist for medications management;Direct supervision/assist for financial management;Help with stairs or ramp for entrance;Supervision due to cognitive status;Assist for transportation   Can travel by private vehicle        Equipment Recommendations  None recommended by PT    Recommendations for Other Services       Precautions / Restrictions Precautions Precautions: Fall Recall of Precautions/Restrictions: Intact Precaution/Restrictions Comments: indwelling urinary catheter Restrictions Weight Bearing Restrictions Per Provider Order: No     Mobility  Bed Mobility Overal bed mobility: Modified Independent Bed Mobility: Supine to Sit     Supine to sit: Modified independent (Device/Increase time), HOB elevated, Used rails (exit R side of bed)          Transfers Overall transfer level: Needs assistance Equipment  used: None Transfers: Sit to/from Stand, Bed to chair/wheelchair/BSC Sit to Stand: Min assist   Step pivot transfers: Min assist (bed>recliner on R with RUE HHA)       General transfer comment: Initial sit<>stand with posterior LOB with min assist to correct. Sit<>stand from standard chair with supervision.    Ambulation/Gait Ambulation/Gait assistance: Min assist Gait Distance (Feet): 8 Feet (+ 150 ft) Assistive device: None, Rolling walker (2 wheels) Gait Pattern/deviations: Decreased step length - right, Decreased step length - left, Decreased stride length Gait velocity: decreased     General Gait Details: Pt ambulates in room x 8 ft without AD with min assist, provided pt with RW & pt able to ambulate in hallway with RW & min assist, occasional episodes of decreased balance with min assist to correct.   Stairs             Wheelchair Mobility     Tilt Bed    Modified Rankin (Stroke Patients Only)       Balance Overall balance assessment: Needs assistance Sitting-balance support: Feet supported Sitting balance-Leahy Scale: Good Sitting balance - Comments: dons shoes sitting EOB without LOB   Standing balance support: No upper extremity supported, During functional activity Standing balance-Leahy Scale: Poor                              Communication Communication Communication: No apparent difficulties  Cognition Arousal: Alert Behavior During Therapy: Flat affect   PT - Cognitive impairments: Safety/Judgement                         Following commands: Intact      Cueing Cueing Techniques: Verbal cues  Exercises Other Exercises Other Exercises: Educated pt on use of incentive spirometer with fair return demo by pt.    General Comments General comments (skin integrity, edema, etc.): changed SpO2 finger probe, cord noted to be torn - nurse made aware of need for new cord.      Pertinent Vitals/Pain Pain Assessment Pain  Assessment: No/denies pain    Home Living                          Prior Function            PT Goals (current goals can now be found in the care plan section) Acute Rehab PT Goals Patient Stated Goal: get stronger PT Goal Formulation: With patient Time For Goal Achievement: 12/27/23 Potential to Achieve Goals: Fair Progress towards PT goals: Progressing toward goals    Frequency    Min 2X/week      PT Plan      Co-evaluation              AM-PAC PT 6 Clicks Mobility   Outcome Measure  Help needed turning from your back to your side while in a flat bed without using bedrails?: None Help needed moving from lying on your back to sitting on the side of a flat bed without using bedrails?: None Help needed moving to and from a bed to a chair (including a wheelchair)?: A Little Help needed standing up from a chair using your arms (e.g., wheelchair or bedside chair)?: A Little Help needed to walk in hospital room?: A Little Help needed climbing 3-5 steps with a railing? : A Little 6 Click Score: 20    End of Session   Activity Tolerance: Patient tolerated treatment well Patient left: in chair;with call bell/phone within reach (MD in room, OT prepping to enter room)   PT Visit Diagnosis: Unsteadiness on feet (R26.81);Other abnormalities of gait and mobility (R26.89);Muscle weakness (generalized) (M62.81);Difficulty in walking, not elsewhere classified (R26.2)     Time: 9054-8993 PT Time Calculation (min) (ACUTE ONLY): 21 min  Charges:    $Therapeutic Activity: 8-22 mins PT General Charges $$ ACUTE PT VISIT: 1 Visit                     Richerd Pinal, PT, DPT 12/16/23, 10:29 AM    Richerd CHRISTELLA Pinal 12/16/2023, 10:28 AM

## 2023-12-16 NOTE — Care Plan (Signed)
 Patient was placed in the chair by PT/OT apx 1130 At apx 1255 patient had an unwitnessed fall from chair Patient was sitting up in the chair when he said the chair gave out on me  He then called for help using the call bell Charge RN, 2 additional RN's and 2 NT's came to the room to assist patient up from floor and back to bed Patient, who is on blood thinners, did not hit his head, had no loc, no injuries and denies pain    12/16/23 1300  What Happened  Was fall witnessed? No  Was patient injured? No  Patient found on floor  Found by Staff-comment  Stated prior activity other (comment) (sitting in chair)  Provider Notification  Provider Name/Title Dr Dennise  Date Provider Notified 12/16/23  Time Provider Notified 1255  Method of Notification Page  Notification Reason Fall  Provider response No new orders  Date of Provider Response 12/16/23  Time of Provider Response 1300  Follow Up  Family notified No - patient refusal (pt stated he would call his wife when she is off work aroung 6:30pm)  Additional tests No  Progress note created (see row info) Yes  Adult Fall Risk Assessment  Risk Factor Category (scoring not indicated) Fall has occurred during this admission (document High fall risk)  Age 73  Fall History: Fall within 6 months prior to admission 0  Elimination; Bowel and/or Urine Incontinence 0  Elimination; Bowel and/or Urine Urgency/Frequency 0  Medications: includes PCA/Opiates, Anti-convulsants, Anti-hypertensives, Diuretics, Hypnotics, Laxatives, Sedatives, and Psychotropics 3  Patient Care Equipment 0  Mobility-Assistance 2  Mobility-Gait 2  Mobility-Sensory Deficit 0  Altered awareness of immediate physical environment 0  Impulsiveness 0  Lack of understanding of one's physical/cognitive limitations 0  Total Score 9  Patient Fall Risk Level High fall risk  Adult Fall Risk Interventions  Required Bundle Interventions *See Row Information* High fall risk - low,  moderate, and high requirements implemented  Additional Interventions Use of appropriate toileting equipment (bedpan, BSC, etc.)  Screening for Fall Injury Risk (To be completed on HIGH fall risk patients) - Assessing Need for Floor Mats  Risk For Fall Injury- Criteria for Floor Mats Previous fall this admission  Vitals  BP (!) 140/99  MAP (mmHg) 113  BP Location Left Arm  BP Method Automatic  Patient Position (if appropriate) Sitting  Pulse Rate 88  Pulse Rate Source Monitor  ECG Heart Rate 88  Resp 20  Oxygen Therapy  SpO2 96 %  O2 Device Room Air  Pain Assessment  Pain Scale 0-10  Pain Score 0  PAINAD (Pain Assessment in Advanced Dementia)  Breathing 0  Negative Vocalization 0  Facial Expression 1  Body Language 0  Consolability 1  PAINAD Score 2  PCA/Epidural/Spinal Assessment  Respiratory Pattern Regular  Neurological  Neuro (WDL) WDL  Level of Consciousness Alert  Orientation Level Oriented X4  Cognition Follows commands  Speech Clear  Neuro Additional Assessments Glasgow Coma Scale  Glasgow Coma Scale  Eye Opening 4  Best Verbal Response (NON-intubated) 5  Best Motor Response 6  Glasgow Coma Scale Score 15  Musculoskeletal  Musculoskeletal (WDL) X  Assistive Device None  Generalized Weakness Yes  Weight Bearing Restrictions Per Provider Order No  Integumentary  Integumentary (WDL) WDL  Pain Assessment  Work-Related Injury No

## 2023-12-16 NOTE — Progress Notes (Signed)
 Occupational Therapy Treatment Patient Details Name: Calvin Escobar MRN: [id number] DOB: 07-Jul-1950 Today's Date: 12/16/2023   History of present illness 73 y.o. male presents to [facility] on 7/9 with tachycardia, tachypnea and cough. + COVID and ecoli bacteremia. PMH: recent admission for hematuria--went home with foley catheter, hyperlipidemia, systolic heart failure, history of colovesical fistula urinary retention anemia of chronic disease, bipolar disorder, dementia.   OT comments  Patient demonstrating good gains with OT treatment. Patient able to stand at sink for grooming and UB bathing tasks with CGA. Patient with LOB while at sink but able to correct with CGA. Patient able to ambulate to bathroom for toilet transfer with min assist for safety and line and stood for toilet hygiene with CGA. Discharge recommendations continue to be appropriate. Acute OT to continue to follow to address established goals to facilitate DC to next venue of care.        If plan is discharge home, recommend the following:  A little help with walking and/or transfers;A little help with bathing/dressing/bathroom;Assistance with cooking/housework;Assist for transportation;Help with stairs or ramp for entrance;Direct supervision/assist for medications management;Direct supervision/assist for financial management   Equipment Recommendations  None recommended by OT (defer bathroom equipment to [facility])    Recommendations for Other Services      Precautions / Restrictions Precautions Precautions: Fall Recall of Precautions/Restrictions: Intact Precaution/Restrictions Comments: indwelling urinary catheter Restrictions Weight Bearing Restrictions Per Provider Order: No (Simultaneous filing. User may not have seen previous data.)       Mobility Bed Mobility Overal bed mobility: Modified Independent             General bed mobility comments: OOB in recliner    Transfers Overall transfer level: Needs  assistance Equipment used: Rolling walker (2 wheels) Transfers: Sit to/from Stand, Bed to chair/wheelchair/BSC Sit to Stand: Min assist           General transfer comment: performed transfers to toilet and chair with cues for safety and hand placement     Balance Overall balance assessment: Needs assistance Sitting-balance support: Feet supported Sitting balance-Leahy Scale: Good     Standing balance support: No upper extremity supported, During functional activity Standing balance-Leahy Scale: Poor Standing balance comment: able to stand at sink with no UE support, patient with LOB while at sink but able to correct with CGA                           ADL either performed or assessed with clinical judgement   ADL Overall ADL's : Needs assistance/impaired     Grooming: Wash/dry hands;Wash/dry face;Oral care;Contact guard assist;Standing Grooming Details (indicate cue type and reason): at sink with seated rest break following Upper Body Bathing: Supervision/ safety;Sitting   Lower Body Bathing: Minimal assistance;Sit to/from stand   Upper Body Dressing : Sitting;Set up Upper Body Dressing Details (indicate cue type and reason): change gown     Toilet Transfer: Minimal assistance;Ambulation;Regular Toilet;Rolling walker (2 wheels)   Toileting- Clothing Manipulation and Hygiene: Contact guard assist;Sit to/from stand         General ADL Comments: patient with LOB while standing at sink but able to correct with CGA    Extremity/Trunk Assessment Upper Extremity Assessment Upper Extremity Assessment: Overall WFL for tasks assessed            Vision       Perception     Praxis     Communication Communication Communication: No apparent difficulties  Cognition Arousal: Alert Behavior During Therapy: Flat affect Cognition: History of cognitive impairments             OT - Cognition Comments: per chart, pt with dementia                  Following commands: Intact        Cueing   Cueing Techniques: Verbal cues  Exercises      Shoulder Instructions       General Comments VSS on RA    Pertinent Vitals/ Pain       Pain Assessment Pain Assessment: No/denies pain Pain Intervention(s): Monitored during session  Home Living                                          Prior Functioning/Environment              Frequency  Min 2X/week        Progress Toward Goals  OT Goals(current goals can now be found in the care plan section)  Progress towards OT goals: Progressing toward goals  Acute Rehab OT Goals Patient Stated Goal: to get better OT Goal Formulation: With patient Time For Goal Achievement: 12/27/23 Potential to Achieve Goals: Good ADL Goals Pt Will Perform Grooming: standing;with modified independence Pt Will Perform Lower Body Bathing: with modified independence;sit to/from stand Pt Will Perform Lower Body Dressing: with modified independence;sit to/from stand Pt Will Transfer to Toilet: with modified independence;ambulating Pt Will Perform Toileting - Clothing Manipulation and hygiene: with modified independence;sit to/from stand  Plan      Co-evaluation                 AM-PAC OT 6 Clicks Daily Activity     Outcome Measure   Help from another person eating meals?: None Help from another person taking care of personal grooming?: A Little Help from another person toileting, which includes using toliet, bedpan, or urinal?: A Little Help from another person bathing (including washing, rinsing, drying)?: A Little Help from another person to put on and taking off regular upper body clothing?: A Little Help from another person to put on and taking off regular lower body clothing?: A Little 6 Click Score: 19    End of Session Equipment Utilized During Treatment: Gait belt;Rolling walker (2 wheels)  OT Visit Diagnosis: Muscle weakness (generalized) (M62.81);Other  abnormalities of gait and mobility (R26.89)   Activity Tolerance Patient tolerated treatment well   Patient Left in chair;with call bell/phone within reach;with chair alarm set   Nurse Communication Mobility status        Time: 8992-8965 OT Time Calculation (min): 27 min  Charges: OT General Charges $OT Visit: 1 Visit OT Treatments $Self Care/Home Management : 23-37 mins  Dick Laine, OTA Acute Rehabilitation Services  Office [phone]   Jeb LITTIE Laine 12/16/2023, 12:43 PM

## 2023-12-16 NOTE — Progress Notes (Signed)
 Patient ID: Calvin Escobar, male   DOB: Aug 19, 1950, 73 y.o.   MRN: [id number]      Subjective: Up in chair No significant abdominal pain  ROS negative except as listed above. Objective: Vital signs in last 24 hours: Temp:  [97.6 F (36.4 C)-98.3 F (36.8 C)] 98.3 F (36.8 C) (07/14 0819) Pulse Rate:  [50-84] 84 (07/14 0819) Resp:  [10-19] 19 (07/14 0819) BP: (116-146)/(63-98) 146/98 (07/14 0819) SpO2:  [94 %-98 %] 95 % (07/14 0819) Weight:  [111.9 kg] 111.9 kg (07/14 0711) Last BM Date : 12/11/23  Intake/Output from previous day: 07/13 0701 - 07/14 0700 In: 1151 [P.O.:948; I.V.:3; IV Piggyback:200] Out: 8175 [Urine:8175] Intake/Output this shift: Total I/O In: 120 [P.O.:120] Out: 900 [Urine:900]  General appearance: alert and cooperative Resp: clear to auscultation bilaterally GI: soft, not tender  Lab Results: CBC  Recent Labs    12/15/23 0821 12/16/23 0615  WBC 12.7* 9.4  HGB 8.9* 8.4*  HCT 29.6* 27.6*  PLT 147* 151   BMET Recent Labs    12/15/23 0821 12/16/23 0615  NA 142 143  K 4.9 4.7  CL 110 109  CO2 22 27  GLUCOSE 100* 99  BUN 56* 43*  CREATININE 1.99* 1.85*  CALCIUM  8.4* 8.5*   PT/INR No results for input(s): LABPROT, INR in the last 72 hours. ABG No results for input(s): PHART, HCO3 in the last 72 hours.  Invalid input(s): PCO2, PO2  Studies/Results: VAS US  LOWER EXTREMITY VENOUS (DVT) Result Date: 12/15/2023  Lower Venous DVT Study Patient Name:  Calvin Escobar [facility]  Date of Exam:   12/14/2023 Medical Rec #: [id number]           Accession #:    [id number] Date of Birth: [birth year]           Patient Gender: M Patient Age:   27 years Exam Location:  [facility] Procedure:      VAS US  LOWER EXTREMITY VENOUS (DVT) Referring Phys: DONALDA APPLEBAUM --------------------------------------------------------------------------------  Indications: Swelling, SOB, and Covid.  Risk Factors: HFrEF, NICM, CHF, history of candidemia during  prior admission 12/02/23-12/07/2023, readmission for fever, tachycardia and new Ecoli and Covid infection. AKI, CKD IIIa. Limitations: Poor ultrasound/tissue interface and Patient sleeping with restless legs. Comparison Study: No prior study on file Performing Technologist: Alberta Escobar RVS  Examination Guidelines: A complete evaluation includes B-mode imaging, spectral Doppler, color Doppler, and power Doppler as needed of all accessible portions of each vessel. Bilateral testing is considered an integral part of a complete examination. Limited examinations for reoccurring indications may be performed as noted. The reflux portion of the exam is performed with the patient in reverse Trendelenburg.  +---------+---------------+---------+-----------+----------+--------------+ RIGHT    CompressibilityPhasicitySpontaneityPropertiesThrombus Aging +---------+---------------+---------+-----------+----------+--------------+ CFV      Full           Yes      No                                  +---------+---------------+---------+-----------+----------+--------------+ SFJ      Full                                                        +---------+---------------+---------+-----------+----------+--------------+ FV Prox  Full  Yes      No                                  +---------+---------------+---------+-----------+----------+--------------+ FV Mid   Full           Yes      No                                  +---------+---------------+---------+-----------+----------+--------------+ FV DistalFull           Yes      No                                  +---------+---------------+---------+-----------+----------+--------------+ PFV      Full           Yes      No                                  +---------+---------------+---------+-----------+----------+--------------+ POP      Full           Yes      No                                   +---------+---------------+---------+-----------+----------+--------------+ PTV      Full                                                        +---------+---------------+---------+-----------+----------+--------------+ PERO     Full                                                        +---------+---------------+---------+-----------+----------+--------------+ Gastroc  Full                                                        +---------+---------------+---------+-----------+----------+--------------+ Large cystic structure containing mixed echoes noted in the popliteal fossa measuring 4.35 x 2.81 cm.  +---------+---------------+---------+-----------+----------+--------------+ LEFT     CompressibilityPhasicitySpontaneityPropertiesThrombus Aging +---------+---------------+---------+-----------+----------+--------------+ CFV      Full           Yes      No                                  +---------+---------------+---------+-----------+----------+--------------+ SFJ      Full                                                        +---------+---------------+---------+-----------+----------+--------------+ FV Prox  Full           Yes      No                                  +---------+---------------+---------+-----------+----------+--------------+ FV Mid   Full                                                        +---------+---------------+---------+-----------+----------+--------------+ FV DistalFull           Yes      No                                  +---------+---------------+---------+-----------+----------+--------------+ PFV      Full                                                        +---------+---------------+---------+-----------+----------+--------------+ POP      Full           Yes      No                                  +---------+---------------+---------+-----------+----------+--------------+ PTV      Full                                                         +---------+---------------+---------+-----------+----------+--------------+ PERO     Full                                                        +---------+---------------+---------+-----------+----------+--------------+     Summary: BILATERAL: - No obvious evidence of deep 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 - RIGHT: - Large cystic structure containing mixed echoes noted in the popliteal fossa measuring 4.35 x 2.81 cm.   *See table(s) above for measurements and observations. Electronically signed by Gaile New MD on 12/15/2023 at 2:05:48 PM.    Final     Anti-infectives: Anti-infectives (From admission, onward)    Start     Dose/Rate Route Frequency Ordered Stop   12/12/23 1000  fluconazole  (DIFLUCAN ) tablet 400 mg        400 mg Oral Daily 12/11/23 1742     12/12/23 1000  remdesivir  100 mg in sodium chloride  0.9 % 100 mL IVPB       Placed in Followed by Linked Group   100 mg 200 mL/hr over 30 Minutes Intravenous Daily 12/11/23 1852 12/13/23 0958   12/12/23 0400  cefTRIAXone  (ROCEPHIN ) 2 g in sodium chloride  0.9 % 100 mL IVPB        2 g 200 mL/hr over 30 Minutes Intravenous Every 24  hours 12/12/23 0337     12/11/23 2000  remdesivir  200 mg in sodium chloride  0.9% 250 mL IVPB       Placed in Followed by Linked Group   200 mg 580 mL/hr over 30 Minutes Intravenous Once 12/11/23 1852 12/11/23 2201   12/11/23 1245  ceFEPIme  (MAXIPIME ) 2 g in sodium chloride  0.9 % 100 mL IVPB        2 g 200 mL/hr over 30 Minutes Intravenous  Once 12/11/23 1230 12/11/23 1534   12/11/23 1245  metroNIDAZOLE  (FLAGYL ) IVPB 500 mg  Status:  Discontinued        500 mg 100 mL/hr over 60 Minutes Intravenous  Once 12/11/23 1230 12/11/23 1517   12/11/23 1245  vancomycin  (VANCOCIN ) IVPB 1000 mg/200 mL premix  Status:  Discontinued        1,000 mg 200 mL/hr over 60 Minutes Intravenous  Once 12/11/23 1230 12/11/23 1230   12/11/23 1245   vancomycin  (VANCOREADY) IVPB 2000 mg/400 mL  Status:  Discontinued        2,000 mg 200 mL/hr over 120 Minutes Intravenous  Once 12/11/23 1230 12/11/23 1517       Assessment/Plan: Colovesicle fistula COVID  Appreciate Cardiology input Surgery with active COVID carries a significantly increased perioperative mortality risk. He is stable from a [facility] standpoint. I recommend he plan to wait and have surgery with Dr. Sheldon once he recovers. This will also allow him to have a minimally invasive approach. I spoke with him in detail and he is agreeable.    LOS: 5 days    Dann Hummer, MD, MPH, FACS Trauma & General Surgery Use [email or web address] to contact on call provider  12/16/2023

## 2023-12-16 NOTE — Progress Notes (Addendum)
 PROGRESS NOTE        PATIENT DETAILS Name: Calvin Escobar Age: 73 y.o. Sex: male Date of Birth: 03-Oct-1950 Admit Date: 12/11/2023 Admitting Physician Maximino DELENA Sharps, MD ERE:[id number], [name], MD  Brief Summary: Patient is a 73 y.o.  male with history of colovesical fistula-numerous recent hospitalizations for sepsis related to UTI/bacteremia-just discharged from this facility on 7/5 for candidemia on oral Diflucan -presented to the hospital with fever-found to have COVID-19 infection and E. coli bacteremia.  Significant events: 7/9>> admit to [facility]-fever-COVID-19 infection/E. coli bacteremia.  Post admission-had an episode of respiratory distress/bronchospasm-started on BiPAP-transfer to progressive care. 7/10>> liberated off BiPAP-on room air-another episode of respiratory distress later in the afternoon-on BiPAP-given Lasix /steroids/bronchodilators. 7/11>> taken off BiPAP this morning-on 1-2 L of oxygen.  Calm/comfortable-not in any distress.  Significant studies: 6/30>> CT abdomen/pelvis: Marked bladder wall thickening with surrounding edema/fluid-suspected tract from sigmoid colon to posterior bladder.  Significant microbiology data: 7/9>> COVID PCR: Positive 7/9>> influenza /RSV PCR: Negative. 7/9>> blood gram culture: E Coli  Procedures: 7/3>> colonoscopy-diverticulosis, severe luminal narrowing distal sigmoid TTE   1. Left ventricular ejection fraction, by estimation, is 25 to 30%. The left ventricle has severely decreased function. The left ventricle demonstrates global hypokinesis. There is mild left ventricular hypertrophy. Left ventricular diastolic parameters  are indeterminate.  2. Right ventricular systolic function is normal. The right ventricular size is normal. There is mildly elevated pulmonary artery systolic pressure. The estimated right ventricular systolic pressure is 43.9 mmHg.  3. The mitral valve is normal in structure. Mild to moderate  mitral valve regurgitation.  4. The aortic valve is tricuspid. Aortic valve regurgitation is not visualized. No aortic stenosis is present.  5. Aortic dilatation noted. There is dilatation of the ascending aorta, measuring 41 mm.  6. The inferior vena cava is dilated in size with <50% respiratory variability, suggesting right atrial pressure of 15 mmHg  Consults: ID Cards CCS  Subjective:  Patient in bed, appears comfortable, denies any headache, no fever, no chest pain or pressure, no shortness of breath , no abdominal pain. No new focal weakness.    Objective: Vitals: Blood pressure (!) 146/98, pulse 84, temperature 98.3 F (36.8 C), temperature source Oral, resp. rate 19, weight 101.9 kg, SpO2 95%.   Exam:  Awake Alert, No new F.N deficits, Normal affect [place].AT,PERRAL Supple Neck, No JVD,   Symmetrical Chest wall movement, Good air movement bilaterally, CTAB RRR,No Gallops, Rubs or new Murmurs,  +ve B.Sounds, Abd Soft, No tenderness,   No Cyanosis, Clubbing or edema    Assessment/Plan:  Severe sepsis secondary to polymicrobial bloodstream infection (candidemia on 6/18 and E. coli bacteremia 7/9)-present on admission (fever/tachypnea/tachycardia/leukocytosis with evidence of AKI) Sepsis physiology has resolved-leukocytosis is likely from steroid. Polymicrobial bloodstream infection-likely secondary to symptomatic chronic colovesical fistula Already on fluconazole  from prior admission, now on IV Rocephin . ID and CCS following, may require definitive surgery will defer that to general surgery, sepsis pathophysiology has improved.  If needed patient has been cleared by cardiology for surgical procedure on 12/13/2023.  Communicated that to general surgery as well, they will see the patient and formulate a plan.  COVID-19 infection Incidental no clinical signs or symptoms of active COVID-19 infection, asymptomatic on room air for several days.  AKI on CKD stage IIIa with  hyperkalemia AKI likely hemodynamic mediated in the setting of sepsis-possible  CHF. Improved with supportive care and diuretics.  Received Lokelma  for hyperkalemia which has improved as well.  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  Required IV diuretics and BiPAP, now stable on room air on 12/14/2023, continue supportive care, cardiology following.  Currently on Coreg  monitor, as needed Lasix , repeat dose on 12/16/2023.  Shortness of breath much improved with diuresis, stable on room air, rapidly taper off steroids.  Normocytic anemia Due to combination of acute illness/ongoing CKD Follow CBC  Elevated D-dimer.  Likely due to sepsis.  Negative venous duplex, no chest pain, currently no hypoxia, echo with relatively stable right-sided physiology clinically no PE.    Incidental finding of right popliteal cyst.  Pain-free.  Outpatient orthopedics follow-up.    History of colovesical fistula Likely cause of recurrent infections/bacteremia/candidemia S/p recent colonoscopy-with sigmoid stricture-suspected to be the site of fistula. CCS on board as above  History of BPH - History of urethral stricture-s/p dilated by Dr. Alvaro on 6/28-with chronic indwelling Foley catheter  Recommendations by urology (consult note on 6/28) were to keep Foley catheter in place-monthly changes recommended.  History of mood disorder  Stable, Abilify /BuSpar /trazodone /Effexor   History of intention tremor  Primidone .  Class 1 Obesity: BMI of 30.  Follow-up with PCP for weight loss.    Code status:   Code Status: Do not attempt resuscitation (DNR) PRE-ARREST INTERVENTIONS DESIRED   DVT Prophylaxis: heparin  injection 5,000 Units Start: 12/12/23 1400 SCDs Start: 12/11/23 1741   Family Communication: Spouse-[name] [phone] updated 7/10   Disposition Plan: Status is: Inpatient Remains inpatient appropriate because: Severity of  illness   Planned Discharge Destination:Home health   Diet: Diet Order             Diet Heart Room service appropriate? Yes; Fluid consistency: Thin  Diet effective now                     Data Review:   Inpatient Medications  Scheduled Meds:  ARIPiprazole   2 mg Oral q AM   aspirin   324 mg Oral Once   budesonide  (PULMICORT ) nebulizer solution  0.25 mg Nebulization BID   busPIRone   10 mg Oral TID   carvedilol   6.25 mg Oral BID   Chlorhexidine  Gluconate Cloth  6 each Topical Daily   finasteride   5 mg Oral Daily   fluconazole   400 mg Oral Daily   folic acid   1 mg Oral Daily   furosemide   20 mg Intravenous Once   gabapentin   100 mg Oral TID   guaiFENesin   600 mg Oral BID   heparin  injection (subcutaneous)  5,000 Units Subcutaneous Q8H   melatonin  10 mg Oral QHS   mirtazapine   30 mg Oral QHS   pravastatin   20 mg Oral QHS   [START ON 12/17/2023] predniSONE   10 mg Oral Q breakfast   primidone   50 mg Oral TID   sodium chloride  flush  3 mL Intravenous Q12H   tamsulosin   0.4 mg Oral Daily   traZODone   100 mg Oral QHS   venlafaxine  XR  150 mg Oral Daily   Continuous Infusions:  cefTRIAXone  (ROCEPHIN )  IV 2 g (12/16/23 0654)   PRN Meds:.acetaminophen  **OR** acetaminophen , diphenhydrAMINE , haloperidol  lactate, levalbuterol     Recent Labs  Lab 12/11/23 1344 12/11/23 1346 12/12/23 0636 12/13/23 0657 12/14/23 0600 12/15/23 0821 12/16/23 0615  WBC 10.8*  --  9.1 23.5* 18.3* 12.7* 9.4  HGB 8.2*   < >  8.3* 7.2* 7.5* 8.9* 8.4*  HCT 26.7*   < > 26.9* 23.7* 24.0* 29.6* 27.6*  PLT 211  --  167 116* 131* 147* 151  MCV 93.7  --  93.7 94.0 91.6 92.5 92.9  MCH 28.8  --  28.9 28.6 28.6 27.8 28.3  MCHC 30.7  --  30.9 30.4 31.3 30.1 30.4  RDW 14.7  --  14.6 14.6 14.2 14.1 14.0  LYMPHSABS 0.8  --   --   --  0.7 1.9 2.1  MONOABS 1.6*  --   --   --  0.8 0.6 0.4  EOSABS 0.1  --   --   --  0.0 0.0 0.1  BASOSABS 0.1  --   --   --  0.0 0.0 0.0   < > = values in this interval not  displayed.    Recent Labs  Lab 12/11/23 1344 12/11/23 1346 12/11/23 1347 12/12/23 0631 12/12/23 0636 12/12/23 1754 12/13/23 0657 12/14/23 0600 12/15/23 0821 12/16/23 0615  NA 136   < >  --   --  137  --  138 137 142 143  K 5.9*   < >  --   --  5.2*  --  5.0 4.4 4.9 4.7  CL 102   < >  --   --  104  --  106 105 110 109  CO2 21*  --   --   --  23  --  21* 20* 22 27  ANIONGAP 13  --   --   --  10  --  11 12 10 7   GLUCOSE 113*   < >  --   --  166*  --  184* 144* 100* 99  BUN 25*   < >  --   --  32*  --  52* 66* 56* 43*  CREATININE 2.40*   < >  --   --  2.48*  --  2.75* 2.41* 1.99* 1.85*  AST 15  --   --   --   --   --   --  21 23 17   ALT 9  --   --   --   --   --   --  14 13 12   ALKPHOS 59  --   --   --   --   --   --  54 57 53  BILITOT 0.3  --   --   --   --   --   --  0.5 0.8 0.5  ALBUMIN  2.9*  --   --   --   --   --   --  2.5* 2.7* 2.6*  CRP  --   --   --  22.8*  --   --  22.7* 19.3* 11.5* 8.3*  DDIMER  --   --   --   --   --   --  3.96*  --   --   --   PROCALCITON  --   --   --  4.66  --   --  >150.00 114.80 52.91  --   LATICACIDVEN  --   --  1.2  --   --   --   --   --   --   --   INR 1.2  --   --   --   --   --   --   --   --   --   TSH  --   --   --   --   --   --   --   --  2.768  --   BNP  --   --   --   --   --  731.8* 629.9* 712.2* 1,013.6* 868.0*  MG  --   --   --   --   --   --   --  2.0 2.1 2.0  PHOS  --   --   --   --   --   --   --  5.3* 3.4 3.1  CALCIUM  8.5*  --   --   --  8.5*  --  8.0* 7.9* 8.4* 8.5*   < > = values in this interval not displayed.      Recent Labs  Lab 12/11/23 1344 12/11/23 1347 12/12/23 0631 12/12/23 0636 12/12/23 1754 12/13/23 0657 12/14/23 0600 12/15/23 0821 12/16/23 0615  CRP  --   --  22.8*  --   --  22.7* 19.3* 11.5* 8.3*  DDIMER  --   --   --   --   --  3.96*  --   --   --   PROCALCITON  --   --  4.66  --   --  >150.00 114.80 52.91  --   LATICACIDVEN  --  1.2  --   --   --   --   --   --   --   INR 1.2  --   --   --   --    --   --   --   --   TSH  --   --   --   --   --   --   --  2.768  --   BNP  --   --   --   --  731.8* 629.9* 712.2* 1,013.6* 868.0*  MG  --   --   --   --   --   --  2.0 2.1 2.0  CALCIUM  8.5*  --   --  8.5*  --  8.0* 7.9* 8.4* 8.5*    --------------------------------------------------------------------------------------------------------------- Lab Results  Component Value Date   CHOL 118 08/14/2023   HDL 39 (L) 08/14/2023   LDLCALC 54 08/14/2023   TRIG 168 (H) 08/14/2023   CHOLHDL 3.0 08/14/2023    Lab Results  Component Value Date   HGBA1C 5.3 08/14/2023   Recent Labs    12/15/23 0821  TSH 2.768  FREET4 0.82   No results for input(s): VITAMINB12, FOLATE, FERRITIN, TIBC, IRON, RETICCTPCT in the last 72 hours. ------------------------------------------------------------------------------------------------------------------ Cardiac Enzymes No results for input(s): CKMB, TROPONINI, MYOGLOBIN in the last 168 hours.  Invalid input(s): CK  Micro Results Recent Results (from the past 240 hours)  Resp panel by RT-PCR (RSV, Flu A&B, Covid) Anterior Nasal Swab     Status: Abnormal   Collection Time: 12/11/23 12:29 PM   Specimen: Anterior Nasal Swab  Result Value Ref Range Status   SARS Coronavirus 2 by RT PCR POSITIVE (A) NEGATIVE Final   Influenza A by PCR NEGATIVE NEGATIVE Final   Influenza B by PCR NEGATIVE NEGATIVE Final    Comment: (NOTE) The Xpert Xpress SARS-CoV-2/FLU/RSV plus assay is intended as an aid in the diagnosis of influenza from Nasopharyngeal swab specimens and should not be used as a sole basis for treatment. Nasal washings and aspirates are unacceptable for Xpert Xpress SARS-CoV-2/FLU/RSV testing.  Fact Sheet for Patients: BloggerCourse.com  Fact Sheet for Healthcare Providers: SeriousBroker.it  This test is not yet approved or cleared by the [place]  FDA and has been  authorized for detection and/or diagnosis of SARS-CoV-2 by FDA under an Emergency Use Authorization (EUA). This EUA will remain in effect (meaning this test can be used) for the duration of the COVID-19 declaration under Section 564(b)(1) of the Act, 21 U.S.C. section 360bbb-3(b)(1), unless the authorization is terminated or revoked.     Resp Syncytial Virus by PCR NEGATIVE NEGATIVE Final    Comment: (NOTE) Fact Sheet for Patients: BloggerCourse.com  Fact Sheet for Healthcare Providers: SeriousBroker.it  This test is not yet approved or cleared by the [place]  FDA and has been authorized for detection and/or diagnosis of SARS-CoV-2 by FDA under an Emergency Use Authorization (EUA). This EUA will remain in effect (meaning this test can be used) for the duration of the COVID-19 declaration under Section 564(b)(1) of the Act, 21 U.S.C. section 360bbb-3(b)(1), unless the authorization is terminated or revoked.  Performed at [facility], [address] [place], [place] [address]   Blood Culture (routine x 2)     Status: Abnormal   Collection Time: 12/11/23  1:30 PM   Specimen: BLOOD RIGHT FOREARM  Result Value Ref Range Status   Specimen Description BLOOD RIGHT FOREARM  Final   Special Requests   Final    BOTTLES DRAWN AEROBIC AND ANAEROBIC Blood Culture results may not be optimal due to an inadequate volume of blood received in culture bottles   Culture  Setup Time   Final    GRAM NEGATIVE RODS IN BOTH AEROBIC AND ANAEROBIC BOTTLES CRITICAL RESULT CALLED TO, READ BACK BY AND VERIFIED WITH: MAYA AGENT LEDFORD [id number] BY JINNY COMMON, MT Performed at [facility], [address], [place], [place] [address]    Culture ESCHERICHIA COLI (A)  Final   Report Status 12/14/2023 FINAL  Final   Organism ID, Bacteria ESCHERICHIA COLI  Final   Organism ID, Bacteria ESCHERICHIA COLI  Final      Susceptibility    Escherichia coli - KIRBY BAUER*    CEFAZOLIN SENSITIVE Sensitive    Escherichia coli - MIC*    AMPICILLIN  <=2 SENSITIVE Sensitive     CEFEPIME  <=0.12 SENSITIVE Sensitive     CEFTAZIDIME <=1 SENSITIVE Sensitive     CEFTRIAXONE  <=0.25 SENSITIVE Sensitive     CIPROFLOXACIN <=0.25 SENSITIVE Sensitive     GENTAMICIN <=1 SENSITIVE Sensitive     IMIPENEM <=0.25 SENSITIVE Sensitive     TRIMETH/SULFA <=20 SENSITIVE Sensitive     AMPICILLIN /SULBACTAM <=2 SENSITIVE Sensitive     PIP/TAZO <=4 SENSITIVE Sensitive ug/mL    * ESCHERICHIA COLI    ESCHERICHIA COLI  Blood Culture ID Panel (Reflexed)     Status: Abnormal   Collection Time: 12/11/23  1:30 PM  Result Value Ref Range Status   Enterococcus faecalis NOT DETECTED NOT DETECTED Final   Enterococcus Faecium NOT DETECTED NOT DETECTED Final   Listeria monocytogenes NOT DETECTED NOT DETECTED Final   Staphylococcus species NOT DETECTED NOT DETECTED Final   Staphylococcus aureus (BCID) NOT DETECTED NOT DETECTED Final   Staphylococcus epidermidis NOT DETECTED NOT DETECTED Final   Staphylococcus lugdunensis NOT DETECTED NOT DETECTED Final   Streptococcus species NOT DETECTED NOT DETECTED Final   Streptococcus agalactiae NOT DETECTED NOT DETECTED Final   Streptococcus pneumoniae NOT DETECTED NOT DETECTED Final   Streptococcus pyogenes NOT DETECTED NOT DETECTED Final   A.calcoaceticus-baumannii NOT DETECTED NOT DETECTED Final   Bacteroides fragilis NOT DETECTED NOT DETECTED Final   Enterobacterales DETECTED (A) NOT DETECTED Final    Comment: Enterobacterales represent  a large order of gram negative bacteria, not a single organism. CRITICAL RESULT CALLED TO, READ BACK BY AND VERIFIED WITH: PHARMD JAMES LEDFORD [id number] BY J RAZZAK, MT    Enterobacter cloacae complex NOT DETECTED NOT DETECTED Final   Escherichia coli DETECTED (A) NOT DETECTED Final    Comment: CRITICAL RESULT CALLED TO, READ BACK BY AND VERIFIED WITH: PHARMD JAMES LEDFORD  [id number] BY J RAZZAK, MT    Klebsiella aerogenes NOT DETECTED NOT DETECTED Final   Klebsiella oxytoca NOT DETECTED NOT DETECTED Final   Klebsiella pneumoniae NOT DETECTED NOT DETECTED Final   Proteus species NOT DETECTED NOT DETECTED Final   Salmonella species NOT DETECTED NOT DETECTED Final   Serratia marcescens NOT DETECTED NOT DETECTED Final   Haemophilus influenzae NOT DETECTED NOT DETECTED Final   Neisseria meningitidis NOT DETECTED NOT DETECTED Final   Pseudomonas aeruginosa NOT DETECTED NOT DETECTED Final   Stenotrophomonas maltophilia NOT DETECTED NOT DETECTED Final   Candida albicans NOT DETECTED NOT DETECTED Final   Candida auris NOT DETECTED NOT DETECTED Final   Candida glabrata NOT DETECTED NOT DETECTED Final   Candida krusei NOT DETECTED NOT DETECTED Final   Candida parapsilosis NOT DETECTED NOT DETECTED Final   Candida tropicalis NOT DETECTED NOT DETECTED Final   Cryptococcus neoformans/gattii NOT DETECTED NOT DETECTED Final   CTX-M ESBL NOT DETECTED NOT DETECTED Final   Carbapenem resistance IMP NOT DETECTED NOT DETECTED Final   Carbapenem resistance KPC NOT DETECTED NOT DETECTED Final   Carbapenem resistance NDM NOT DETECTED NOT DETECTED Final   Carbapenem resist OXA 48 LIKE NOT DETECTED NOT DETECTED Final   Carbapenem resistance VIM NOT DETECTED NOT DETECTED Final    Comment: Performed at [facility], [address] [place], [place] [address]  Blood Culture (routine x 2)     Status: None   Collection Time: 12/11/23  6:30 PM   Specimen: BLOOD  Result Value Ref Range Status   Specimen Description BLOOD SITE NOT SPECIFIED  Final   Special Requests   Final    BOTTLES DRAWN AEROBIC AND ANAEROBIC Blood Culture adequate volume   Culture   Final    NO GROWTH 5 DAYS Performed at [facility], [address], [place], [place] [address]    Report Status 12/16/2023 FINAL  Final    Radiology Reports  VAS US  LOWER EXTREMITY VENOUS (DVT) Result Date:  12/15/2023  Lower Venous DVT Study Patient Name:  Calvin Escobar [facility]  Date of Exam:   12/14/2023 Medical Rec #: [id number]           Accession #:    [id number] Date of Birth: [birth year]           Patient Gender: M Patient Age:   72 years Exam Location:  [facility] Procedure:      VAS US  LOWER EXTREMITY VENOUS (DVT) Referring Phys: DONALDA APPLEBAUM --------------------------------------------------------------------------------  Indications: Swelling, SOB, and Covid.  Risk Factors: HFrEF, NICM, CHF, history of candidemia during prior admission 12/02/23-12/07/2023, readmission for fever, tachycardia and new Ecoli and Covid infection. AKI, CKD IIIa. Limitations: Poor ultrasound/tissue interface and Patient sleeping with restless legs. Comparison Study: No prior study on file Performing Technologist: Alberta Lis RVS  Examination Guidelines: A complete evaluation includes B-mode imaging, spectral Doppler, color Doppler, and power Doppler as needed of all accessible portions of each vessel. Bilateral testing is considered an integral part of a complete examination. Limited examinations for reoccurring indications may be performed as noted.  The reflux portion of the exam is performed with the patient in reverse Trendelenburg.  +---------+---------------+---------+-----------+----------+--------------+ RIGHT    CompressibilityPhasicitySpontaneityPropertiesThrombus Aging +---------+---------------+---------+-----------+----------+--------------+ CFV      Full           Yes      No                                  +---------+---------------+---------+-----------+----------+--------------+ SFJ      Full                                                        +---------+---------------+---------+-----------+----------+--------------+ FV Prox  Full           Yes      No                                  +---------+---------------+---------+-----------+----------+--------------+ FV Mid   Full            Yes      No                                  +---------+---------------+---------+-----------+----------+--------------+ FV DistalFull           Yes      No                                  +---------+---------------+---------+-----------+----------+--------------+ PFV      Full           Yes      No                                  +---------+---------------+---------+-----------+----------+--------------+ POP      Full           Yes      No                                  +---------+---------------+---------+-----------+----------+--------------+ PTV      Full                                                        +---------+---------------+---------+-----------+----------+--------------+ PERO     Full                                                        +---------+---------------+---------+-----------+----------+--------------+ Gastroc  Full                                                        +---------+---------------+---------+-----------+----------+--------------+  Large cystic structure containing mixed echoes noted in the popliteal fossa measuring 4.35 x 2.81 cm.  +---------+---------------+---------+-----------+----------+--------------+ LEFT     CompressibilityPhasicitySpontaneityPropertiesThrombus Aging +---------+---------------+---------+-----------+----------+--------------+ CFV      Full           Yes      No                                  +---------+---------------+---------+-----------+----------+--------------+ SFJ      Full                                                        +---------+---------------+---------+-----------+----------+--------------+ FV Prox  Full           Yes      No                                  +---------+---------------+---------+-----------+----------+--------------+ FV Mid   Full                                                         +---------+---------------+---------+-----------+----------+--------------+ FV DistalFull           Yes      No                                  +---------+---------------+---------+-----------+----------+--------------+ PFV      Full                                                        +---------+---------------+---------+-----------+----------+--------------+ POP      Full           Yes      No                                  +---------+---------------+---------+-----------+----------+--------------+ PTV      Full                                                        +---------+---------------+---------+-----------+----------+--------------+ PERO     Full                                                        +---------+---------------+---------+-----------+----------+--------------+     Summary: BILATERAL: - No obvious evidence of deep 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 - RIGHT: - Large cystic structure containing mixed echoes noted in the popliteal fossa measuring 4.35 x  2.81 cm.   *See table(s) above for measurements and observations. Electronically signed by Gaile New MD on 12/15/2023 at 2:05:48 PM.    Final       Signature  -   Lavada Stank M.D on 12/16/2023 at 8:28 AM   -  To page go to [email or web address]

## 2023-12-17 ENCOUNTER — Telehealth: Admitting: *Deleted

## 2023-12-17 DIAGNOSIS — U071 COVID-19: Secondary | ICD-10-CM | POA: Diagnosis not present

## 2023-12-17 LAB — CBC WITH DIFFERENTIAL/PLATELET
Abs Immature Granulocytes: 0.18 K/uL — ABNORMAL HIGH (ref 0.00–0.07)
Basophils Absolute: 0 K/uL (ref 0.0–0.1)
Basophils Relative: 0 %
Eosinophils Absolute: 0.1 K/uL (ref 0.0–0.5)
Eosinophils Relative: 1 %
HCT: 28.8 % — ABNORMAL LOW (ref 39.0–52.0)
Hemoglobin: 8.8 g/dL — ABNORMAL LOW (ref 13.0–17.0)
Immature Granulocytes: 2 %
Lymphocytes Relative: 28 %
Lymphs Abs: 2.7 K/uL (ref 0.7–4.0)
MCH: 27.9 pg (ref 26.0–34.0)
MCHC: 30.6 g/dL (ref 30.0–36.0)
MCV: 91.4 fL (ref 80.0–100.0)
Monocytes Absolute: 0.5 K/uL (ref 0.1–1.0)
Monocytes Relative: 5 %
Neutro Abs: 6.2 K/uL (ref 1.7–7.7)
Neutrophils Relative %: 64 %
Platelets: 176 K/uL (ref 150–400)
RBC: 3.15 MIL/uL — ABNORMAL LOW (ref 4.22–5.81)
RDW: 14.1 % (ref 11.5–15.5)
WBC: 9.6 K/uL (ref 4.0–10.5)
nRBC: 0 % (ref 0.0–0.2)

## 2023-12-17 LAB — COMPREHENSIVE METABOLIC PANEL WITH GFR
ALT: 12 U/L (ref 0–44)
AST: 14 U/L — ABNORMAL LOW (ref 15–41)
Albumin: 2.5 g/dL — ABNORMAL LOW (ref 3.5–5.0)
Alkaline Phosphatase: 50 U/L (ref 38–126)
Anion gap: 11 (ref 5–15)
BUN: 44 mg/dL — ABNORMAL HIGH (ref 8–23)
CO2: 26 mmol/L (ref 22–32)
Calcium: 8.7 mg/dL — ABNORMAL LOW (ref 8.9–10.3)
Chloride: 103 mmol/L (ref 98–111)
Creatinine, Ser: 1.89 mg/dL — ABNORMAL HIGH (ref 0.61–1.24)
GFR, Estimated: 37 mL/min — ABNORMAL LOW (ref >60)
Glucose, Bld: 106 mg/dL — ABNORMAL HIGH (ref 70–99)
Potassium: 4.4 mmol/L (ref 3.5–5.1)
Sodium: 140 mmol/L (ref 135–145)
Total Bilirubin: 0.4 mg/dL (ref 0.0–1.2)
Total Protein: 6.1 g/dL — ABNORMAL LOW (ref 6.5–8.1)

## 2023-12-17 LAB — BRAIN NATRIURETIC PEPTIDE: B Natriuretic Peptide: 742.1 pg/mL — ABNORMAL HIGH (ref 0.0–100.0)

## 2023-12-17 LAB — PHOSPHORUS: Phosphorus: 3.8 mg/dL (ref 2.5–4.6)

## 2023-12-17 LAB — PROCALCITONIN: Procalcitonin: 9.31 ng/mL

## 2023-12-17 LAB — MAGNESIUM: Magnesium: 1.8 mg/dL (ref 1.7–2.4)

## 2023-12-17 LAB — C-REACTIVE PROTEIN: CRP: 5.9 mg/dL — ABNORMAL HIGH (ref <1.0)

## 2023-12-17 MED ORDER — AMOXICILLIN-POT CLAVULANATE 875-125 MG PO TABS
1.0000 | ORAL_TABLET | Freq: Two times a day (BID) | ORAL | Status: DC
  Administered 2023-12-18 – 2023-12-19 (×3): 1 via ORAL
  Filled 2023-12-17 (×4): qty 1

## 2023-12-17 NOTE — Progress Notes (Signed)
   12/17/23 2254  BiPAP/CPAP/SIPAP  BiPAP/CPAP/SIPAP Pt Type Adult  BiPAP/CPAP/SIPAP SERVO  Reason BIPAP/CPAP not in use Non-compliant  Mask Type Full face mask  Dentures removed? Not applicable  Mask Size Large

## 2023-12-17 NOTE — Progress Notes (Signed)
 [facility]  Date of Admission:  12/11/2023     Principal Problem:   COVID-19 Active Problems:   Dementia (HCC)   HLD (hyperlipidemia)   Hyperkalemia   Anemia of chronic disease   Acute kidney injury superimposed on chronic kidney disease (HCC)   Colovesical fistula   Hypocalcemia   Candidemia (HCC)   BPH with urinary obstruction   Anxiety   Bipolar disorder (HCC)   SIRS (systemic inflammatory response syndrome) (HCC)   Leukocytosis   Chronic HFrEF (heart failure with reduced ejection fraction) [facility])          Assessment: 73 year old male with history of multiple comorbidities including alcohol abuse, dementia, cardiomyopathy, chronic colovesicular fistula was recently admitted for candidemia and plan was to complete 2-week course of fluconazole .  He developed fevers tested positive COVID-19 blood culture grew E. coli on admission.  ID was engaged for bacteremia #E. coli bacteremia likely secondary to colovesicular fistula #Candida albicans fungemia-treated #COVID status post remdesivir  - Seen by cardiology for cardiology clearance was noted recommend proceeding as soon as he is able to such as COVID infection - Surgery noted that given COVID, increased perioperative mortality risk, recommend to wait for your doctor once recovers.  I discussed with surgery today noted possible opening for fistula repair in August.  Recommendations: -DC ceftriaxone  - Start Augmentin  complete 2 weeks total EOT 7/22 - Discontinue fluconazole  patient was completed 10 days of treatment from negative blood cultures for Candida albicans on 6/30 - Date of surgical intervention is not set thus far(possibly august).  Prolonged antibiotics will  breed for resistance. Patient needs close monitoring given high risk of repeated bacteremia/fungemia setting of colovesicular fistula. - Follow-up with infectious disease chronic on 8/1  to do blood cultures off antibiotics -ID  will sign off - Communicated with primary   Evaluation of this patient requires complex antimicrobial therapy evaluation and counseling + isolation needs for disease transmission risk assessment and mitigation   Microbiology:   Antibiotics: Fluconazole  7/10-present Ceftriaxone  7/9-present Cultures: Blood 7/9/2 E. coli Urine  Other   SUBJECTIVE: Resting in bed.  No new complaints. Interval: Afebrile overnight.  WBC 9.6K.  Review of Systems: Review of Systems  All other systems reviewed and are negative.    Scheduled Meds:  [START ON 12/18/2023] amoxicillin -clavulanate  1 tablet Oral Q12H   ARIPiprazole   2 mg Oral q AM   aspirin   324 mg Oral Once   budesonide  (PULMICORT ) nebulizer solution  0.25 mg Nebulization BID   busPIRone   10 mg Oral TID   carvedilol   6.25 mg Oral BID   Chlorhexidine  Gluconate Cloth  6 each Topical Daily   finasteride   5 mg Oral Daily   folic acid   1 mg Oral Daily   gabapentin   100 mg Oral TID   guaiFENesin   600 mg Oral BID   heparin  injection (subcutaneous)  5,000 Units Subcutaneous Q8H   melatonin  10 mg Oral QHS   mirtazapine   30 mg Oral QHS   pravastatin   20 mg Oral QHS   predniSONE   10 mg Oral Q breakfast   primidone   50 mg Oral TID   sodium chloride  flush  3 mL Intravenous Q12H   tamsulosin   0.4 mg Oral Daily   traZODone   100 mg Oral QHS   venlafaxine  XR  150 mg Oral Daily   Continuous Infusions: PRN Meds:.acetaminophen  **OR** acetaminophen , haloperidol  lactate, levalbuterol , zolpidem  Allergies  Allergen Reactions   Albuterol   Palpitations    Supraventricular Tachycardia     OBJECTIVE: Vitals:   12/17/23 0327 12/17/23 0423 12/17/23 0811 12/17/23 1237  BP: 124/87  (!) 137/93 (!) 146/116  Pulse: 60  72 83  Resp: 16  19 16   Temp: 97.8 F (36.6 C)  97.6 F (36.4 C) 98.1 F (36.7 C)  TempSrc: Oral  Oral Oral  SpO2: 95%  93%   Weight:  111.6 kg     Body mass index is 33.37 kg/m.  Physical Exam Constitutional:      General:  He is not in acute distress.    Appearance: He is normal weight. He is not toxic-appearing.  HENT:     Head: Normocephalic and atraumatic.     Right Ear: External ear normal.     Left Ear: External ear normal.     Nose: No congestion or rhinorrhea.     Mouth/Throat:     Mouth: Mucous membranes are moist.     Pharynx: Oropharynx is clear.  Eyes:     Extraocular Movements: Extraocular movements intact.     Conjunctiva/sclera: Conjunctivae normal.     Pupils: Pupils are equal, round, and reactive to light.  Cardiovascular:     Rate and Rhythm: Normal rate and regular rhythm.     Heart sounds: No murmur heard.    No friction rub. No gallop.  Pulmonary:     Effort: Pulmonary effort is normal.     Breath sounds: Normal breath sounds.  Abdominal:     General: Bowel sounds are normal.  Musculoskeletal:        General: No swelling. Normal range of motion.     Cervical back: Normal range of motion and neck supple.  Skin:    General: Skin is warm and dry.  Neurological:     General: No focal deficit present.     Mental Status: He is oriented to person, place, and time.  Psychiatric:        Mood and Affect: Mood normal.       Lab Results Lab Results  Component Value Date   WBC 9.6 12/17/2023   HGB 8.8 (L) 12/17/2023   HCT 28.8 (L) 12/17/2023   MCV 91.4 12/17/2023   PLT 176 12/17/2023    Lab Results  Component Value Date   CREATININE 1.89 (H) 12/17/2023   BUN 44 (H) 12/17/2023   NA 140 12/17/2023   K 4.4 12/17/2023   CL 103 12/17/2023   CO2 26 12/17/2023    Lab Results  Component Value Date   ALT 12 12/17/2023   AST 14 (L) 12/17/2023   ALKPHOS 50 12/17/2023   BILITOT 0.4 12/17/2023        Loney Stank, MD [facility] 12/17/2023, 12:40 PM

## 2023-12-17 NOTE — Plan of Care (Signed)
  Problem: Respiratory: Goal: Will maintain a patent airway Outcome: Progressing   Problem: Clinical Measurements: Goal: Diagnostic test results will improve Outcome: Progressing Goal: Respiratory complications will improve Outcome: Progressing   Problem: Coping: Goal: Level of anxiety will decrease Outcome: Progressing

## 2023-12-17 NOTE — Progress Notes (Signed)
 PROGRESS NOTE        PATIENT DETAILS Name: Calvin Escobar Age: 73 y.o. Sex: male Date of Birth: 03-Nov-1950 Admit Date: 12/11/2023 Admitting Physician Maximino DELENA Sharps, MD ERE:[id number], [name], MD  Brief Summary: Patient is a 73 y.o.  male with history of colovesical fistula-numerous recent hospitalizations for sepsis related to UTI/bacteremia-just discharged from this facility on 7/5 for candidemia on oral Diflucan -presented to the hospital with fever-found to have COVID-19 infection and E. coli bacteremia.  Significant events: 7/9>> admit to [facility]-fever-COVID-19 infection/E. coli bacteremia.  Post admission-had an episode of respiratory distress/bronchospasm-started on BiPAP-transfer to progressive care. 7/10>> liberated off BiPAP-on room air-another episode of respiratory distress later in the afternoon-on BiPAP-given Lasix /steroids/bronchodilators. 7/11>> taken off BiPAP this morning-on 1-2 L of oxygen.  Calm/comfortable-not in any distress.  Significant studies: 6/30>> CT abdomen/pelvis: Marked bladder wall thickening with surrounding edema/fluid-suspected tract from sigmoid colon to posterior bladder.  Significant microbiology data: 7/9>> COVID PCR: Positive 7/9>> influenza /RSV PCR: Negative. 7/9>> blood gram culture: E Coli  Procedures: 7/3>> colonoscopy-diverticulosis, severe luminal narrowing distal sigmoid TTE   1. Left ventricular ejection fraction, by estimation, is 25 to 30%. The left ventricle has severely decreased function. The left ventricle demonstrates global hypokinesis. There is mild left ventricular hypertrophy. Left ventricular diastolic parameters  are indeterminate.  2. Right ventricular systolic function is normal. The right ventricular size is normal. There is mildly elevated pulmonary artery systolic pressure. The estimated right ventricular systolic pressure is 43.9 mmHg.  3. The mitral valve is normal in structure. Mild to moderate  mitral valve regurgitation.  4. The aortic valve is tricuspid. Aortic valve regurgitation is not visualized. No aortic stenosis is present.  5. Aortic dilatation noted. There is dilatation of the ascending aorta, measuring 41 mm.  6. The inferior vena cava is dilated in size with <50% respiratory variability, suggesting right atrial pressure of 15 mmHg  Consults: ID Cards CCS  Subjective:  Patient in bed, appears comfortable, denies any headache, no fever, no chest pain or pressure, no shortness of breath , no abdominal pain. No focal weakness.    Objective: Vitals: Blood pressure (!) 137/93, pulse 72, temperature 97.6 F (36.4 C), temperature source Oral, resp. rate 19, weight 111.6 kg, SpO2 93%.   Exam:  Awake Alert, No new F.N deficits, Normal affect [place].AT,PERRAL Supple Neck, No JVD,   Symmetrical Chest wall movement, Good air movement bilaterally, CTAB RRR,No Gallops, Rubs or new Murmurs,  +ve B.Sounds, Abd Soft, No tenderness,   No Cyanosis, Clubbing or edema    Assessment/Plan:  Severe sepsis secondary to polymicrobial bloodstream infection (candidemia on 6/18 and E. coli bacteremia 7/9)-present on admission (fever/tachypnea/tachycardia/leukocytosis with evidence of AKI) Sepsis physiology has resolved-leukocytosis is likely from steroid. Polymicrobial bloodstream infection-likely secondary to symptomatic chronic colovesical fistula Already on fluconazole  from prior admission, now on IV Rocephin . Per general surgery now outpatient follow-up with Dr. Sheldon for elective surgical procedure. Will request ID to come up with a discharge antibiotic regimen, likely discharge in the next 1 to 2 days if stable.  COVID-19 infection Incidental no clinical signs or symptoms of active COVID-19 infection, asymptomatic on room air for several days.  AKI on CKD stage IIIa with hyperkalemia, baseline creatinine appears to be close to 1.4 AKI likely hemodynamic mediated in the setting of  sepsis-possible CHF. Improved with supportive care and diuretics.  Received Lokelma  for hyperkalemia which has improved as well.  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  Required IV diuretics and BiPAP, now stable on room air on 12/14/2023, continue supportive care, cardiology following.  Currently on Coreg  monitor, as needed Lasix , repeat dose on 12/16/2023.  Shortness of breath much improved with diuresis, stable on room air, rapidly taper off steroids.  Normocytic anemia Due to combination of acute illness/ongoing CKD Follow CBC  Elevated D-dimer.  Likely due to sepsis.  Negative venous duplex, no chest pain, currently no hypoxia, echo with relatively stable right-sided physiology clinically no PE.    Incidental finding of right popliteal cyst.  Pain-free.  Outpatient orthopedics follow-up.    History of colovesical fistula Likely cause of recurrent infections/bacteremia/candidemia S/p recent colonoscopy-with sigmoid stricture-suspected to be the site of fistula. CCS on board as above  History of BPH - History of urethral stricture-s/p dilated by Dr. Alvaro on 6/28-with chronic indwelling Foley catheter  Recommendations by urology (consult note on 6/28) were to keep Foley catheter in place-monthly changes recommended.  History of mood disorder  Stable, Abilify /BuSpar /trazodone /Effexor   History of intention tremor  Primidone .  Class 1 Obesity: BMI of 30.  Follow-up with PCP for weight loss.    Code status:   Code Status: Do not attempt resuscitation (DNR) PRE-ARREST INTERVENTIONS DESIRED   DVT Prophylaxis: heparin  injection 5,000 Units Start: 12/12/23 1400 SCDs Start: 12/11/23 1741   Family Communication: Spouse-[name] [phone] updated 7/10   Disposition Plan: Status is: Inpatient Remains inpatient appropriate because: Severity of illness   Planned Discharge Destination:Home  health   Diet: Diet Order             Diet Heart Room service appropriate? Yes; Fluid consistency: Thin  Diet effective now                     Data Review:   Inpatient Medications  Scheduled Meds:  ARIPiprazole   2 mg Oral q AM   aspirin   324 mg Oral Once   budesonide  (PULMICORT ) nebulizer solution  0.25 mg Nebulization BID   busPIRone   10 mg Oral TID   carvedilol   6.25 mg Oral BID   Chlorhexidine  Gluconate Cloth  6 each Topical Daily   finasteride   5 mg Oral Daily   fluconazole   400 mg Oral Daily   folic acid   1 mg Oral Daily   gabapentin   100 mg Oral TID   guaiFENesin   600 mg Oral BID   heparin  injection (subcutaneous)  5,000 Units Subcutaneous Q8H   melatonin  10 mg Oral QHS   mirtazapine   30 mg Oral QHS   pravastatin   20 mg Oral QHS   predniSONE   10 mg Oral Q breakfast   primidone   50 mg Oral TID   sodium chloride  flush  3 mL Intravenous Q12H   tamsulosin   0.4 mg Oral Daily   traZODone   100 mg Oral QHS   venlafaxine  XR  150 mg Oral Daily   Continuous Infusions:  cefTRIAXone  (ROCEPHIN )  IV 2 g (12/17/23 0329)   PRN Meds:.acetaminophen  **OR** acetaminophen , haloperidol  lactate, levalbuterol , zolpidem     Recent Labs  Lab 12/11/23 1344 12/11/23 1346 12/13/23 0657 12/14/23 0600 12/15/23 0821 12/16/23 0615 12/17/23 0601  WBC 10.8*   < > 23.5* 18.3* 12.7* 9.4 9.6  HGB 8.2*   < > 7.2* 7.5* 8.9* 8.4* 8.8*  HCT 26.7*   < > 23.7* 24.0* 29.6* 27.6* 28.8*  PLT 211   < > 116* 131* 147* 151 176  MCV 93.7   < > 94.0 91.6 92.5 92.9 91.4  MCH 28.8   < > 28.6 28.6 27.8 28.3 27.9  MCHC 30.7   < > 30.4 31.3 30.1 30.4 30.6  RDW 14.7   < > 14.6 14.2 14.1 14.0 14.1  LYMPHSABS 0.8  --   --  0.7 1.9 2.1 2.7  MONOABS 1.6*  --   --  0.8 0.6 0.4 0.5  EOSABS 0.1  --   --  0.0 0.0 0.1 0.1  BASOSABS 0.1  --   --  0.0 0.0 0.0 0.0   < > = values in this interval not displayed.    Recent Labs  Lab  0000 12/11/23 1344 12/11/23 1346 12/11/23 1347 12/12/23 0631  12/12/23 0636 12/13/23 0657 12/14/23 0600 12/15/23 0821 12/16/23 0615 12/17/23 0601  NA  --  136   < >  --   --    < > 138 137 142 143 140  K  --  5.9*   < >  --   --    < > 5.0 4.4 4.9 4.7 4.4  CL  --  102   < >  --   --    < > 106 105 110 109 103  CO2  --  21*  --   --   --    < > 21* 20* 22 27 26   ANIONGAP  --  13  --   --   --    < > 11 12 10 7 11   GLUCOSE  --  113*   < >  --   --    < > 184* 144* 100* 99 106*  BUN  --  25*   < >  --   --    < > 52* 66* 56* 43* 44*  CREATININE  --  2.40*   < >  --   --    < > 2.75* 2.41* 1.99* 1.85* 1.89*  AST  --  15  --   --   --   --   --  21 23 17  14*  ALT  --  9  --   --   --   --   --  14 13 12 12   ALKPHOS  --  59  --   --   --   --   --  54 57 53 50  BILITOT  --  0.3  --   --   --   --   --  0.5 0.8 0.5 0.4  ALBUMIN   --  2.9*  --   --   --   --   --  2.5* 2.7* 2.6* 2.5*  CRP   < >  --   --   --  22.8*  --  22.7* 19.3* 11.5* 8.3* 5.9*  DDIMER  --   --   --   --   --   --  3.96*  --   --   --   --   PROCALCITON  --   --   --   --  4.66  --  >150.00 114.80 52.91 22.87  --   LATICACIDVEN  --   --   --  1.2  --   --   --   --   --   --   --   INR  --  1.2  --   --   --   --   --   --   --   --   --  TSH  --   --   --   --   --   --   --   --  2.768  --   --   BNP  --   --   --   --   --    < > 629.9* 712.2* 1,013.6* 868.0* 742.1*  MG  --   --   --   --   --   --   --  2.0 2.1 2.0 1.8  PHOS  --   --   --   --   --   --   --  5.3* 3.4 3.1 3.8  CALCIUM   --  8.5*  --   --   --    < > 8.0* 7.9* 8.4* 8.5* 8.7*   < > = values in this interval not displayed.      Recent Labs  Lab  0000 12/11/23 1344 12/11/23 1347 12/12/23 0631 12/12/23 0636 12/13/23 0657 12/14/23 0600 12/15/23 0821 12/16/23 0615 12/17/23 0601  CRP   < >  --   --  22.8*  --  22.7* 19.3* 11.5* 8.3* 5.9*  DDIMER  --   --   --   --   --  3.96*  --   --   --   --   PROCALCITON  --   --   --  4.66  --  >150.00 114.80 52.91 22.87  --   LATICACIDVEN  --   --  1.2  --   --   --    --   --   --   --   INR  --  1.2  --   --   --   --   --   --   --   --   TSH  --   --   --   --   --   --   --  2.768  --   --   BNP  --   --   --   --    < > 629.9* 712.2* 1,013.6* 868.0* 742.1*  MG  --   --   --   --   --   --  2.0 2.1 2.0 1.8  CALCIUM   --  8.5*  --   --    < > 8.0* 7.9* 8.4* 8.5* 8.7*   < > = values in this interval not displayed.    --------------------------------------------------------------------------------------------------------------- Lab Results  Component Value Date   CHOL 118 08/14/2023   HDL 39 (L) 08/14/2023   LDLCALC 54 08/14/2023   TRIG 168 (H) 08/14/2023   CHOLHDL 3.0 08/14/2023    Lab Results  Component Value Date   HGBA1C 5.3 08/14/2023   Recent Labs    12/15/23 0821  TSH 2.768  FREET4 0.82   No results for input(s): VITAMINB12, FOLATE, FERRITIN, TIBC, IRON, RETICCTPCT in the last 72 hours. ------------------------------------------------------------------------------------------------------------------ Cardiac Enzymes No results for input(s): CKMB, TROPONINI, MYOGLOBIN in the last 168 hours.  Invalid input(s): CK  Micro Results Recent Results (from the past 240 hours)  Resp panel by RT-PCR (RSV, Flu A&B, Covid) Anterior Nasal Swab     Status: Abnormal   Collection Time: 12/11/23 12:29 PM   Specimen: Anterior Nasal Swab  Result Value Ref Range Status   SARS Coronavirus 2 by RT PCR POSITIVE (A) NEGATIVE Final   Influenza A by PCR NEGATIVE NEGATIVE Final   Influenza B by PCR NEGATIVE NEGATIVE Final  Comment: (NOTE) The Xpert Xpress SARS-CoV-2/FLU/RSV plus assay is intended as an aid in the diagnosis of influenza from Nasopharyngeal swab specimens and should not be used as a sole basis for treatment. Nasal washings and aspirates are unacceptable for Xpert Xpress SARS-CoV-2/FLU/RSV testing.  Fact Sheet for Patients: BloggerCourse.com  Fact Sheet for Healthcare  Providers: SeriousBroker.it  This test is not yet approved or cleared by the [place]  FDA and has been authorized for detection and/or diagnosis of SARS-CoV-2 by FDA under an Emergency Use Authorization (EUA). This EUA will remain in effect (meaning this test can be used) for the duration of the COVID-19 declaration under Section 564(b)(1) of the Act, 21 U.S.C. section 360bbb-3(b)(1), unless the authorization is terminated or revoked.     Resp Syncytial Virus by PCR NEGATIVE NEGATIVE Final    Comment: (NOTE) Fact Sheet for Patients: BloggerCourse.com  Fact Sheet for Healthcare Providers: SeriousBroker.it  This test is not yet approved or cleared by the [place]  FDA and has been authorized for detection and/or diagnosis of SARS-CoV-2 by FDA under an Emergency Use Authorization (EUA). This EUA will remain in effect (meaning this test can be used) for the duration of the COVID-19 declaration under Section 564(b)(1) of the Act, 21 U.S.C. section 360bbb-3(b)(1), unless the authorization is terminated or revoked.  Performed at [facility], [address], [place], [place] [address]   Blood Culture (routine x 2)     Status: Abnormal   Collection Time: 12/11/23  1:30 PM   Specimen: BLOOD RIGHT FOREARM  Result Value Ref Range Status   Specimen Description BLOOD RIGHT FOREARM  Final   Special Requests   Final    BOTTLES DRAWN AEROBIC AND ANAEROBIC Blood Culture results may not be optimal due to an inadequate volume of blood received in culture bottles   Culture  Setup Time   Final    GRAM NEGATIVE RODS IN BOTH AEROBIC AND ANAEROBIC BOTTLES CRITICAL RESULT CALLED TO, READ BACK BY AND VERIFIED WITH: MAYA AGENT LEDFORD [phone] BY JINNY COMMON, MT Performed at [facility], [address] [place], [place] [address]    Culture ESCHERICHIA COLI (A)  Final   Report Status 12/14/2023 FINAL   Final   Organism ID, Bacteria ESCHERICHIA COLI  Final   Organism ID, Bacteria ESCHERICHIA COLI  Final      Susceptibility   Escherichia coli - KIRBY BAUER*    CEFAZOLIN SENSITIVE Sensitive    Escherichia coli - MIC*    AMPICILLIN  <=2 SENSITIVE Sensitive     CEFEPIME  <=0.12 SENSITIVE Sensitive     CEFTAZIDIME <=1 SENSITIVE Sensitive     CEFTRIAXONE  <=0.25 SENSITIVE Sensitive     CIPROFLOXACIN <=0.25 SENSITIVE Sensitive     GENTAMICIN <=1 SENSITIVE Sensitive     IMIPENEM <=0.25 SENSITIVE Sensitive     TRIMETH/SULFA <=20 SENSITIVE Sensitive     AMPICILLIN /SULBACTAM <=2 SENSITIVE Sensitive     PIP/TAZO <=4 SENSITIVE Sensitive ug/mL    * ESCHERICHIA COLI    ESCHERICHIA COLI  Blood Culture ID Panel (Reflexed)     Status: Abnormal   Collection Time: 12/11/23  1:30 PM  Result Value Ref Range Status   Enterococcus faecalis NOT DETECTED NOT DETECTED Final   Enterococcus Faecium NOT DETECTED NOT DETECTED Final   Listeria monocytogenes NOT DETECTED NOT DETECTED Final   Staphylococcus species NOT DETECTED NOT DETECTED Final   Staphylococcus aureus (BCID) NOT DETECTED NOT DETECTED Final   Staphylococcus epidermidis NOT DETECTED NOT DETECTED Final  Staphylococcus lugdunensis NOT DETECTED NOT DETECTED Final   Streptococcus species NOT DETECTED NOT DETECTED Final   Streptococcus agalactiae NOT DETECTED NOT DETECTED Final   Streptococcus pneumoniae NOT DETECTED NOT DETECTED Final   Streptococcus pyogenes NOT DETECTED NOT DETECTED Final   A.calcoaceticus-baumannii NOT DETECTED NOT DETECTED Final   Bacteroides fragilis NOT DETECTED NOT DETECTED Final   Enterobacterales DETECTED (A) NOT DETECTED Final    Comment: Enterobacterales represent a large order of gram negative bacteria, not a single organism. CRITICAL RESULT CALLED TO, READ BACK BY AND VERIFIED WITH: PHARMD JAMES LEDFORD [id number] BY J RAZZAK, MT    Enterobacter cloacae complex NOT DETECTED NOT DETECTED Final   Escherichia coli  DETECTED (A) NOT DETECTED Final    Comment: CRITICAL RESULT CALLED TO, READ BACK BY AND VERIFIED WITH: PHARMD JAMES LEDFORD [phone] [id number] BY J RAZZAK, MT    Klebsiella aerogenes NOT DETECTED NOT DETECTED Final   Klebsiella oxytoca NOT DETECTED NOT DETECTED Final   Klebsiella pneumoniae NOT DETECTED NOT DETECTED Final   Proteus species NOT DETECTED NOT DETECTED Final   Salmonella species NOT DETECTED NOT DETECTED Final   Serratia marcescens NOT DETECTED NOT DETECTED Final   Haemophilus influenzae NOT DETECTED NOT DETECTED Final   Neisseria meningitidis NOT DETECTED NOT DETECTED Final   Pseudomonas aeruginosa NOT DETECTED NOT DETECTED Final   Stenotrophomonas maltophilia NOT DETECTED NOT DETECTED Final   Candida albicans NOT DETECTED NOT DETECTED Final   Candida auris NOT DETECTED NOT DETECTED Final   Candida glabrata NOT DETECTED NOT DETECTED Final   Candida krusei NOT DETECTED NOT DETECTED Final   Candida parapsilosis NOT DETECTED NOT DETECTED Final   Candida tropicalis NOT DETECTED NOT DETECTED Final   Cryptococcus neoformans/gattii NOT DETECTED NOT DETECTED Final   CTX-M ESBL NOT DETECTED NOT DETECTED Final   Carbapenem resistance IMP NOT DETECTED NOT DETECTED Final   Carbapenem resistance KPC NOT DETECTED NOT DETECTED Final   Carbapenem resistance NDM NOT DETECTED NOT DETECTED Final   Carbapenem resist OXA 48 LIKE NOT DETECTED NOT DETECTED Final   Carbapenem resistance VIM NOT DETECTED NOT DETECTED Final    Comment: Performed at [facility], [address], [place], [place] [address]  Blood Culture (routine x 2)     Status: None   Collection Time: 12/11/23  6:30 PM   Specimen: BLOOD  Result Value Ref Range Status   Specimen Description BLOOD SITE NOT SPECIFIED  Final   Special Requests   Final    BOTTLES DRAWN AEROBIC AND ANAEROBIC Blood Culture adequate volume   Culture   Final    NO GROWTH 5 DAYS Performed at [facility], [address] [place], [place]  [address]    Report Status 12/16/2023 FINAL  Final    Radiology Reports  No results found.     Signature  -   Lavada Stank M.D on 12/17/2023 at 9:39 AM   -  To page go to [email or web address]

## 2023-12-17 NOTE — Progress Notes (Signed)
 Patient ID: Calvin Escobar, male   DOB: [birth year], 73 y.o.   MRN: [id number]      Subjective: Tolerating diet and having bowel function. Understands reasoning for delay in surgical intervention. Our team has attempted multiple times to contact patient's wife, I will try again this afternoon   Objective: Vital signs in last 24 hours: Temp:  [97.6 F (36.4 C)-99.2 F (37.3 C)] 97.6 F (36.4 C) (07/15 0811) Pulse Rate:  [60-88] 72 (07/15 0811) Resp:  [16-26] 19 (07/15 0811) BP: (124-155)/(84-100) 137/93 (07/15 0811) SpO2:  [93 %-97 %] 93 % (07/15 0811) Weight:  [111.6 kg] 111.6 kg (07/15 0423) Last BM Date : 12/11/23  Intake/Output from previous day: 07/14 0701 - 07/15 0700 In: 2288 [P.O.:2188; IV Piggyback:100] Out: 4600 [Urine:4600] Intake/Output this shift: Total I/O In: 3 [I.V.:3] Out: -   General appearance: alert and cooperative Resp: clear to auscultation bilaterally GI: soft, not tender  Lab Results: CBC  Recent Labs    12/16/23 0615 12/17/23 0601  WBC 9.4 9.6  HGB 8.4* 8.8*  HCT 27.6* 28.8*  PLT 151 176   BMET Recent Labs    12/16/23 0615 12/17/23 0601  NA 143 140  K 4.7 4.4  CL 109 103  CO2 27 26  GLUCOSE 99 106*  BUN 43* 44*  CREATININE 1.85* 1.89*  CALCIUM  8.5* 8.7*   PT/INR No results for input(s): LABPROT, INR in the last 72 hours. ABG No results for input(s): PHART, HCO3 in the last 72 hours.  Invalid input(s): PCO2, PO2  Studies/Results: No results found.   Anti-infectives: Anti-infectives (From admission, onward)    Start     Dose/Rate Route Frequency Ordered Stop   12/18/23 1000  amoxicillin -clavulanate (AUGMENTIN ) 875-125 MG per tablet 1 tablet        1 tablet Oral Every 12 hours 12/17/23 1046 12/26/23 0959   12/12/23 1000  fluconazole  (DIFLUCAN ) tablet 400 mg  Status:  Discontinued        400 mg Oral Daily 12/11/23 1742 12/17/23 1046   12/12/23 1000  remdesivir  100 mg in sodium chloride  0.9 % 100 mL IVPB        Placed in Followed by Linked Group   100 mg 200 mL/hr over 30 Minutes Intravenous Daily 12/11/23 1852 12/13/23 0958   12/12/23 0400  cefTRIAXone  (ROCEPHIN ) 2 g in sodium chloride  0.9 % 100 mL IVPB  Status:  Discontinued        2 g 200 mL/hr over 30 Minutes Intravenous Every 24 hours 12/12/23 0337 12/17/23 1046   12/11/23 2000  remdesivir  200 mg in sodium chloride  0.9% 250 mL IVPB       Placed in Followed by Linked Group   200 mg 580 mL/hr over 30 Minutes Intravenous Once 12/11/23 1852 12/11/23 2201   12/11/23 1245  ceFEPIme  (MAXIPIME ) 2 g in sodium chloride  0.9 % 100 mL IVPB        2 g 200 mL/hr over 30 Minutes Intravenous  Once 12/11/23 1230 12/11/23 1534   12/11/23 1245  metroNIDAZOLE  (FLAGYL ) IVPB 500 mg  Status:  Discontinued        500 mg 100 mL/hr over 60 Minutes Intravenous  Once 12/11/23 1230 12/11/23 1517   12/11/23 1245  vancomycin  (VANCOCIN ) IVPB 1000 mg/200 mL premix  Status:  Discontinued        1,000 mg 200 mL/hr over 60 Minutes Intravenous  Once 12/11/23 1230 12/11/23 1230   12/11/23 1245  vancomycin  (VANCOREADY) IVPB 2000 mg/400 mL  Status:  Discontinued        2,000 mg 200 mL/hr over 120 Minutes Intravenous  Once 12/11/23 1230 12/11/23 1517       Assessment/Plan: Colovesicle fistula COVID  Appreciate Cardiology input Surgery with active COVID carries a significantly increased perioperative mortality risk. He is stable from a [facility] standpoint. I recommend he plan to wait and have surgery with Dr. Sheldon once he recovers. This will also allow him to have a minimally invasive approach. Will reach out to office to help schedule surgery and any pre-operative visits. Will attempt again to update wife as well. No other recommendations from a surgical standpoint at this time, general surgery will sign off.    LOS: 6 days   Burnard JONELLE Louder, [facility] Surgery 12/17/2023, 11:58 AM Please see Amion for pager number during day hours 7:00am-4:30pm

## 2023-12-18 ENCOUNTER — Encounter (HOSPITAL_COMMUNITY): Payer: Self-pay | Admitting: Internal Medicine

## 2023-12-18 ENCOUNTER — Ambulatory Visit: Admitting: Nurse Practitioner

## 2023-12-18 DIAGNOSIS — U071 COVID-19: Secondary | ICD-10-CM | POA: Diagnosis not present

## 2023-12-18 LAB — COMPREHENSIVE METABOLIC PANEL WITH GFR
ALT: 11 U/L (ref 0–44)
AST: 15 U/L (ref 15–41)
Albumin: 2.6 g/dL — ABNORMAL LOW (ref 3.5–5.0)
Alkaline Phosphatase: 54 U/L (ref 38–126)
Anion gap: 12 (ref 5–15)
BUN: 41 mg/dL — ABNORMAL HIGH (ref 8–23)
CO2: 27 mmol/L (ref 22–32)
Calcium: 8.8 mg/dL — ABNORMAL LOW (ref 8.9–10.3)
Chloride: 103 mmol/L (ref 98–111)
Creatinine, Ser: 1.94 mg/dL — ABNORMAL HIGH (ref 0.61–1.24)
GFR, Estimated: 35 mL/min — ABNORMAL LOW (ref >60)
Glucose, Bld: 101 mg/dL — ABNORMAL HIGH (ref 70–99)
Potassium: 4.4 mmol/L (ref 3.5–5.1)
Sodium: 142 mmol/L (ref 135–145)
Total Bilirubin: 0.4 mg/dL (ref 0.0–1.2)
Total Protein: 5.9 g/dL — ABNORMAL LOW (ref 6.5–8.1)

## 2023-12-18 LAB — CBC WITH DIFFERENTIAL/PLATELET
Abs Immature Granulocytes: 0.52 K/uL — ABNORMAL HIGH (ref 0.00–0.07)
Basophils Absolute: 0.1 K/uL (ref 0.0–0.1)
Basophils Relative: 1 %
Eosinophils Absolute: 0.3 K/uL (ref 0.0–0.5)
Eosinophils Relative: 2 %
HCT: 30.3 % — ABNORMAL LOW (ref 39.0–52.0)
Hemoglobin: 9.2 g/dL — ABNORMAL LOW (ref 13.0–17.0)
Immature Granulocytes: 5 %
Lymphocytes Relative: 26 %
Lymphs Abs: 3 K/uL (ref 0.7–4.0)
MCH: 28 pg (ref 26.0–34.0)
MCHC: 30.4 g/dL (ref 30.0–36.0)
MCV: 92.4 fL (ref 80.0–100.0)
Monocytes Absolute: 0.7 K/uL (ref 0.1–1.0)
Monocytes Relative: 6 %
Neutro Abs: 7.1 K/uL (ref 1.7–7.7)
Neutrophils Relative %: 60 %
Platelets: 171 K/uL (ref 150–400)
RBC: 3.28 MIL/uL — ABNORMAL LOW (ref 4.22–5.81)
RDW: 14.2 % (ref 11.5–15.5)
WBC: 11.6 K/uL — ABNORMAL HIGH (ref 4.0–10.5)
nRBC: 0 % (ref 0.0–0.2)

## 2023-12-18 LAB — PROCALCITONIN: Procalcitonin: 4.23 ng/mL

## 2023-12-18 MED ORDER — MAGNESIUM HYDROXIDE 400 MG/5ML PO SUSP
30.0000 mL | Freq: Two times a day (BID) | ORAL | Status: DC
  Administered 2023-12-18: 30 mL via ORAL
  Filled 2023-12-18 (×3): qty 30

## 2023-12-18 MED ORDER — POLYETHYLENE GLYCOL 3350 17 G PO PACK
17.0000 g | PACK | Freq: Two times a day (BID) | ORAL | Status: DC
  Administered 2023-12-18: 17 g via ORAL
  Filled 2023-12-18 (×3): qty 1

## 2023-12-18 MED ORDER — DOCUSATE SODIUM 100 MG PO CAPS
200.0000 mg | ORAL_CAPSULE | Freq: Two times a day (BID) | ORAL | Status: DC
  Administered 2023-12-18: 200 mg via ORAL
  Filled 2023-12-18 (×3): qty 2

## 2023-12-18 NOTE — Plan of Care (Signed)

## 2023-12-18 NOTE — Progress Notes (Signed)
 Physical Therapy Treatment Patient Details Name: Calvin Escobar MRN: [id number] DOB: September 20, 1950 Today's Date: 12/18/2023   History of Present Illness 73 y.o. male presents to [facility] on 7/9 with tachycardia, tachypnea and cough. + COVID and ecoli bacteremia. PMH: recent admission for hematuria--went home with foley catheter, hyperlipidemia, systolic heart failure, history of colovesical fistula urinary retention anemia of chronic disease, bipolar disorder, dementia.    PT Comments  Pt tolerated treatment well today. Pt today was able to progress ambulation in hallway today with RW CGA/Min A. No change in DC/DME recs at this time. PT will continue to follow.     If plan is discharge home, recommend the following: A little help with walking and/or transfers;A little help with bathing/dressing/bathroom;Assistance with cooking/housework;Direct supervision/assist for medications management;Direct supervision/assist for financial management;Help with stairs or ramp for entrance;Supervision due to cognitive status;Assist for transportation   Can travel by private vehicle        Equipment Recommendations  None recommended by PT    Recommendations for Other Services       Precautions / Restrictions Precautions Precautions: Fall Recall of Precautions/Restrictions: Intact Precaution/Restrictions Comments: indwelling urinary catheter Restrictions Weight Bearing Restrictions Per Provider Order: No     Mobility  Bed Mobility Overal bed mobility: Modified Independent Bed Mobility: Supine to Sit     Supine to sit: Modified independent (Device/Increase time), HOB elevated, Used rails     General bed mobility comments: OOB in recliner    Transfers Overall transfer level: Needs assistance Equipment used: Rolling walker (2 wheels) Transfers: Sit to/from Stand Sit to Stand: Contact guard assist, Min assist           General transfer comment: Cues for hand placement.     Ambulation/Gait Ambulation/Gait assistance: Contact guard assist, Min assist Gait Distance (Feet): 250 Feet Assistive device: Rolling walker (2 wheels) Gait Pattern/deviations: Decreased step length - right, Decreased step length - left, Decreased stride length Gait velocity: decreased     General Gait Details: 1 LOB noted when turning out of room. Otherwise CGA for ambulation in hallway. Pt given N95 in order to ambulate in hallway.   Stairs             Wheelchair Mobility     Tilt Bed    Modified Rankin (Stroke Patients Only)       Balance Overall balance assessment: Needs assistance Sitting-balance support: Feet supported Sitting balance-Leahy Scale: Good Sitting balance - Comments: dons shoes sitting EOB without LOB   Standing balance support: No upper extremity supported, During functional activity Standing balance-Leahy Scale: Poor                              Communication Communication Communication: No apparent difficulties Factors Affecting Communication: Other (comment) (Slowed speech)  Cognition Arousal: Alert Behavior During Therapy: Flat affect   PT - Cognitive impairments: Safety/Judgement                         Following commands: Intact      Cueing Cueing Techniques: Verbal cues  Exercises      General Comments General comments (skin integrity, edema, etc.): VSS on RA      Pertinent Vitals/Pain Pain Assessment Pain Assessment: No/denies pain    Home Living                          Prior Function  PT Goals (current goals can now be found in the care plan section) Progress towards PT goals: Progressing toward goals    Frequency    Min 2X/week      PT Plan      Co-evaluation              AM-PAC PT 6 Clicks Mobility   Outcome Measure  Help needed turning from your back to your side while in a flat bed without using bedrails?: None Help needed moving from lying  on your back to sitting on the side of a flat bed without using bedrails?: None Help needed moving to and from a bed to a chair (including a wheelchair)?: A Little Help needed standing up from a chair using your arms (e.g., wheelchair or bedside chair)?: A Little Help needed to walk in hospital room?: A Little Help needed climbing 3-5 steps with a railing? : A Little 6 Click Score: 20    End of Session Equipment Utilized During Treatment: Gait belt Activity Tolerance: Patient tolerated treatment well Patient left: in chair;with call bell/phone within reach Nurse Communication: Mobility status PT Visit Diagnosis: Unsteadiness on feet (R26.81);Other abnormalities of gait and mobility (R26.89);Muscle weakness (generalized) (M62.81);Difficulty in walking, not elsewhere classified (R26.2)     Time: 8892-8870 PT Time Calculation (min) (ACUTE ONLY): 22 min  Charges:    $Gait Training: 8-22 mins PT General Charges $$ ACUTE PT VISIT: 1 Visit                     Jarrah Seher B, PT, DPT Acute Rehab Services [phone]    Tayon Parekh 12/18/2023, 3:48 PM

## 2023-12-18 NOTE — Plan of Care (Signed)
   Problem: Clinical Measurements: Goal: Will remain free from infection Outcome: Progressing Goal: Diagnostic test results will improve Outcome: Progressing Goal: Respiratory complications will improve Outcome: Progressing   Problem: Coping: Goal: Level of anxiety will decrease Outcome: Progressing

## 2023-12-18 NOTE — Progress Notes (Signed)
 PROGRESS NOTE        PATIENT DETAILS Name: Calvin Escobar Age: 73 y.o. Sex: male Date of Birth: 01/12/1951 Admit Date: 12/11/2023 Admitting Physician Calvin DELENA Sharps, MD ERE:[id number], Calvin MATSU, MD  Brief Summary: Patient is a 73 y.o.  male with history of colovesical fistula-numerous recent hospitalizations for sepsis related to UTI/bacteremia-just discharged from this facility on 7/5 for candidemia on oral Diflucan -presented to the hospital with fever-found to have COVID-19 infection and E. coli bacteremia.  Significant events: 7/9>> admit to [facility]-fever-COVID-19 infection/E. coli bacteremia.  Post admission-had an episode of respiratory distress/bronchospasm-started on BiPAP-transfer to progressive care. 7/10>> liberated off BiPAP-on room air-another episode of respiratory distress later in the afternoon-on BiPAP-given Lasix /steroids/bronchodilators. 7/11>> taken off BiPAP this morning-on 1-2 L of oxygen.  Calm/comfortable-not in any distress.  Significant studies: 6/30>> CT abdomen/pelvis: Marked bladder wall thickening with surrounding edema/fluid-suspected tract from sigmoid colon to posterior bladder.  Significant microbiology data: 7/9>> COVID PCR: Positive 7/9>> influenza /RSV PCR: Negative. 7/9>> blood gram culture: E Coli  Procedures: 7/3>> colonoscopy-diverticulosis, severe luminal narrowing distal sigmoid TTE   1. Left ventricular ejection fraction, by estimation, is 25 to 30%. The left ventricle has severely decreased function. The left ventricle demonstrates global hypokinesis. There is mild left ventricular hypertrophy. Left ventricular diastolic parameters  are indeterminate.  2. Right ventricular systolic function is normal. The right ventricular size is normal. There is mildly elevated pulmonary artery systolic pressure. The estimated right ventricular systolic pressure is 43.9 mmHg.  3. The mitral valve is normal in structure. Mild to  moderate mitral valve regurgitation.  4. The aortic valve is tricuspid. Aortic valve regurgitation is not visualized. No aortic stenosis is present.  5. Aortic dilatation noted. There is dilatation of the ascending aorta, measuring 41 mm.  6. The inferior vena cava is dilated in size with <50% respiratory variability, suggesting right atrial pressure of 15 mmHg  Consults: ID Cards CCS  Subjective:  Patient in bed, appears comfortable, denies any headache, no fever, no chest pain or pressure, no shortness of breath , no abdominal pain. No focal weakness.    Objective: Vitals: Blood pressure (!) 139/98, pulse 83, temperature 97.8 F (36.6 C), temperature source Oral, resp. rate (!) 23, weight 106.9 kg, SpO2 92%.   Exam:  Awake Alert, No new F.N deficits, Normal affect [name],PERRAL Supple Neck, No JVD,   Symmetrical Chest wall movement, Good air movement bilaterally, CTAB RRR,No Gallops, Rubs or new Murmurs,  +ve B.Sounds, Abd Soft, No tenderness,   No Cyanosis, Clubbing or edema    Assessment/Plan:  Severe sepsis secondary to polymicrobial bloodstream infection (candidemia on 6/18 and E. coli bacteremia 7/9)-present on admission (fever/tachypnea/tachycardia/leukocytosis with evidence of AKI) Sepsis physiology has resolved-leukocytosis is likely from steroid. Polymicrobial bloodstream infection-likely secondary to symptomatic chronic colovesical fistula Already on fluconazole  from prior admission, now on IV Rocephin . Per general surgery now outpatient follow-up with Calvin Escobar for elective surgical procedure. ID has placed him on Augmentin  with stop date of 12/24/2023, if stable likely discharge home tomorrow.  With outpatient follow-up with general surgery.  COVID-19 infection Incidental no clinical signs or symptoms of active COVID-19 infection, asymptomatic on room air for several days.  AKI on CKD stage IIIa with hyperkalemia, baseline creatinine appears to be close to 1.4 AKI  likely hemodynamic mediated in the setting of sepsis-possible CHF. Improved with supportive  care and diuretics.  Received Lokelma  for hyperkalemia which has improved as well.  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  Required IV diuretics and BiPAP, now stable on room air on 12/14/2023, continue supportive care, cardiology following.  Currently on Coreg  monitor, as needed Lasix , repeat dose on 12/16/2023.  Shortness of breath much improved with diuresis, stable on room air, rapidly taper off steroids.  Normocytic anemia Due to combination of acute illness/ongoing CKD Follow CBC  Elevated D-dimer.  Likely due to sepsis.  Negative venous duplex, no chest pain, currently no hypoxia, echo with relatively stable right-sided physiology clinically no PE.    Incidental finding of right popliteal cyst.  Pain-free.  Outpatient orthopedics follow-up.    History of colovesical fistula Likely cause of recurrent infections/bacteremia/candidemia S/p recent colonoscopy-with sigmoid stricture-suspected to be the site of fistula. CCS on board as above  History of BPH - History of urethral stricture-s/p dilated by Dr. Alvaro on 6/28-with chronic indwelling Foley catheter  Recommendations by urology (consult note on 6/28) were to keep Foley catheter in place-monthly changes recommended.  History of mood disorder  Stable, Abilify /BuSpar /trazodone /Effexor   History of intention tremor  Primidone .  Class 1 Obesity: BMI of 30.  Follow-up with PCP for weight loss.    Code status:   Code Status: Do not attempt resuscitation (DNR) PRE-ARREST INTERVENTIONS DESIRED   DVT Prophylaxis: heparin  injection 5,000 Units Start: 12/12/23 1400 SCDs Start: 12/11/23 1741   Family Communication: Spouse-Calvin Escobar-[phone] message left 12/18/2023 at 9 AM  Disposition Plan: Status is: Inpatient Remains inpatient appropriate because: Severity of  illness   Planned Discharge Destination:Home health   Diet: Diet Order             Diet Heart Room service appropriate? Yes; Fluid consistency: Thin  Diet effective now                     Data Review:   Inpatient Medications  Scheduled Meds:  amoxicillin -clavulanate  1 tablet Oral Q12H   ARIPiprazole   2 mg Oral q AM   aspirin   324 mg Oral Once   budesonide  (PULMICORT ) nebulizer solution  0.25 mg Nebulization BID   busPIRone   10 mg Oral TID   carvedilol   6.25 mg Oral BID   Chlorhexidine  Gluconate Cloth  6 each Topical Daily   finasteride   5 mg Oral Daily   folic acid   1 mg Oral Daily   gabapentin   100 mg Oral TID   guaiFENesin   600 mg Oral BID   heparin  injection (subcutaneous)  5,000 Units Subcutaneous Q8H   melatonin  10 mg Oral QHS   mirtazapine   30 mg Oral QHS   pravastatin   20 mg Oral QHS   predniSONE   10 mg Oral Q breakfast   primidone   50 mg Oral TID   sodium chloride  flush  3 mL Intravenous Q12H   tamsulosin   0.4 mg Oral Daily   traZODone   100 mg Oral QHS   venlafaxine  XR  150 mg Oral Daily   Continuous Infusions:   PRN Meds:.acetaminophen  **OR** acetaminophen , haloperidol  lactate, levalbuterol , zolpidem     Recent Labs  Lab 12/14/23 0600 12/15/23 0821 12/16/23 0615 12/17/23 0601 12/18/23 0507  WBC 18.3* 12.7* 9.4 9.6 11.6*  HGB 7.5* 8.9* 8.4* 8.8* 9.2*  HCT 24.0* 29.6* 27.6* 28.8* 30.3*  PLT 131* 147* 151 176 171  MCV 91.6 92.5 92.9 91.4 92.4  MCH 28.6 27.8 28.3 27.9 28.0  MCHC 31.3 30.1 30.4 30.6 30.4  RDW 14.2 14.1 14.0 14.1 14.2  LYMPHSABS 0.7 1.9 2.1 2.7 3.0  MONOABS 0.8 0.6 0.4 0.5 0.7  EOSABS 0.0 0.0 0.1 0.1 0.3  BASOSABS 0.0 0.0 0.0 0.0 0.1    Recent Labs  Lab 12/11/23 1344 12/11/23 1346 12/11/23 1347 12/12/23 0631 12/13/23 0657 12/14/23 0600 12/15/23 0821 12/16/23 0615 12/17/23 0601 12/18/23 0507  NA 136   < >  --    < > 138 137 142 143 140 142  K 5.9*   < >  --    < > 5.0 4.4 4.9 4.7 4.4 4.4  CL 102   < >  --    <  > 106 105 110 109 103 103  CO2 21*  --   --    < > 21* 20* 22 27 26 27   ANIONGAP 13  --   --    < > 11 12 10 7 11 12   GLUCOSE 113*   < >  --    < > 184* 144* 100* 99 106* 101*  BUN 25*   < >  --    < > 52* 66* 56* 43* 44* 41*  CREATININE 2.40*   < >  --    < > 2.75* 2.41* 1.99* 1.85* 1.89* 1.94*  AST 15  --   --   --   --  21 23 17  14* 15  ALT 9  --   --   --   --  14 13 12 12 11   ALKPHOS 59  --   --   --   --  54 57 53 50 54  BILITOT 0.3  --   --   --   --  0.5 0.8 0.5 0.4 0.4  ALBUMIN  2.9*  --   --   --   --  2.5* 2.7* 2.6* 2.5* 2.6*  CRP  --   --   --    < > 22.7* 19.3* 11.5* 8.3* 5.9*  --   DDIMER  --   --   --   --  3.96*  --   --   --   --   --   PROCALCITON  --   --   --    < > >150.00 114.80 52.91 22.87 9.31 4.23  LATICACIDVEN  --   --  1.2  --   --   --   --   --   --   --   INR 1.2  --   --   --   --   --   --   --   --   --   TSH  --   --   --   --   --   --  2.768  --   --   --   BNP  --   --   --    < > 629.9* 712.2* 1,013.6* 868.0* 742.1*  --   MG  --   --   --   --   --  2.0 2.1 2.0 1.8  --   PHOS  --   --   --   --   --  5.3* 3.4 3.1 3.8  --   CALCIUM  8.5*  --   --    < > 8.0* 7.9* 8.4* 8.5* 8.7* 8.8*   < > = values in this interval not displayed.      Recent Labs  Lab 12/11/23 1344 12/11/23 1347 12/12/23 0631 12/13/23 0657 12/14/23 0600 12/15/23 0821 12/16/23 0615 12/17/23 0601 12/18/23 0507  CRP  --   --    < > 22.7* 19.3* 11.5* 8.3* 5.9*  --   DDIMER  --   --   --  3.96*  --   --   --   --   --   PROCALCITON  --   --    < > >150.00 114.80 52.91 22.87 9.31 4.23  LATICACIDVEN  --  1.2  --   --   --   --   --   --   --   INR 1.2  --   --   --   --   --   --   --   --   TSH  --   --   --   --   --  2.768  --   --   --   BNP  --   --    < > 629.9* 712.2* 1,013.6* 868.0* 742.1*  --   MG  --   --   --   --  2.0 2.1 2.0 1.8  --   CALCIUM  8.5*  --    < > 8.0* 7.9* 8.4* 8.5* 8.7* 8.8*   < > = values in this interval not displayed.     --------------------------------------------------------------------------------------------------------------- Lab Results  Component Value Date   CHOL 118 08/14/2023   HDL 39 (L) 08/14/2023   LDLCALC 54 08/14/2023   TRIG 168 (H) 08/14/2023   CHOLHDL 3.0 08/14/2023    Lab Results  Component Value Date   HGBA1C 5.3 08/14/2023   Radiology Reports  No results found.     Signature  -   Lavada Stank M.D on 12/18/2023 at 8:58 AM   -  To page go to [email or web address]

## 2023-12-18 NOTE — Progress Notes (Addendum)
   Anesthesia recommends not doing colectomy until 10 days asymptomatic from pulmonary standpoint if only a mild COVID diagnosis.  Given the fact that he had pulmonary issues they are leaning towards 4 weeks total which would make mid August more reasonable.    However, he seems to have decreased ejection fraction now.  Looks like cardiology has cleared prior to echocardiogram.  I think beneficial for the patient to get tuned up even though he is rather weak condition.  Medicine working on that.  I wonder if cardiology needs to see again since they initially were not convinced he had heart failure but echo says otherwise.  I redid posting sheet and orders to schedule robotic sigmoid colectomy and colovesical fistula takedown with possible/probable ostomy in early August as long as he continues to improve.  Scheduling in the works.  Being on steroid taper makes risk of leak higher.  I am leaning more towards some type of ostomy.  Perhaps temporary diverting loop ileostomy versus permanent end colostomy and see if he can recover.  Will need urology at the start of the case for firefly of bladder and ureters to see and protect.  OK to stay on suppressive ABx such as Augmentin  if risk of recurrent UTI persisting while on steroid taper.  ID consulted apparently   If he declines worse, would recommend diverting colostomy and then plan resection later.  Try and hold off in next few weeks.

## 2023-12-18 NOTE — Plan of Care (Signed)

## 2023-12-19 ENCOUNTER — Other Ambulatory Visit (HOSPITAL_COMMUNITY): Payer: Self-pay

## 2023-12-19 DIAGNOSIS — U071 COVID-19: Secondary | ICD-10-CM | POA: Diagnosis not present

## 2023-12-19 MED ORDER — CARVEDILOL 6.25 MG PO TABS
6.2500 mg | ORAL_TABLET | Freq: Two times a day (BID) | ORAL | 0 refills | Status: DC
  Filled 2023-12-19: qty 60, 30d supply, fill #0

## 2023-12-19 MED ORDER — DOCUSATE SODIUM 100 MG PO CAPS
200.0000 mg | ORAL_CAPSULE | Freq: Two times a day (BID) | ORAL | 0 refills | Status: DC | PRN
  Filled 2023-12-19: qty 20, 5d supply, fill #0

## 2023-12-19 MED ORDER — FUROSEMIDE 40 MG PO TABS
40.0000 mg | ORAL_TABLET | Freq: Every day | ORAL | 0 refills | Status: DC | PRN
  Filled 2023-12-19: qty 30, 30d supply, fill #0

## 2023-12-19 MED ORDER — AMOXICILLIN-POT CLAVULANATE 875-125 MG PO TABS
1.0000 | ORAL_TABLET | Freq: Two times a day (BID) | ORAL | 0 refills | Status: AC
  Filled 2023-12-19: qty 12, 6d supply, fill #0

## 2023-12-19 NOTE — Progress Notes (Signed)
 Occupational Therapy Treatment Patient Details Name: Calvin Escobar MRN: [id number] DOB: 1950-09-15 Today's Date: 12/19/2023   History of present illness 73 y.o. male presents to [facility] on 7/9 with tachycardia, tachypnea and cough. + COVID and ecoli bacteremia. PMH: recent admission for hematuria--went home with foley catheter, hyperlipidemia, systolic heart failure, history of colovesical fistula urinary retention anemia of chronic disease, bipolar disorder, dementia.   OT comments  Patient demonstrating good gains with OT treatment. Patient able to get to EOB without assistance and able to donn shoes seated on EOB. Patient was CGA to stand from EOB, standing while performing bathing and grooming at sink, and for transfers. Patient with no LOB during this session. Discharge recommendations continue to be appropriate. Acute OT to continue to follow.       If plan is discharge home, recommend the following:  A little help with walking and/or transfers;A little help with bathing/dressing/bathroom;Assistance with cooking/housework;Assist for transportation;Help with stairs or ramp for entrance;Direct supervision/assist for medications management;Direct supervision/assist for financial management   Equipment Recommendations  None recommended by OT (defer bathroom equipment to [facility])    Recommendations for Other Services      Precautions / Restrictions Precautions Precautions: Fall Recall of Precautions/Restrictions: Intact Precaution/Restrictions Comments: indwelling urinary catheter Restrictions Weight Bearing Restrictions Per Provider Order: No       Mobility Bed Mobility Overal bed mobility: Modified Independent Bed Mobility: Supine to Sit     Supine to sit: Modified independent (Device/Increase time), HOB elevated, Used rails     General bed mobility comments: able to get to EOB without assistance    Transfers Overall transfer level: Needs assistance Equipment used: Rolling  walker (2 wheels) Transfers: Sit to/from Stand Sit to Stand: Contact guard assist           General transfer comment: CGA to stand from EOB and ambulate to sink     Balance Overall balance assessment: Needs assistance Sitting-balance support: Feet supported Sitting balance-Leahy Scale: Good Sitting balance - Comments: dons shoes sitting EOB without LOB   Standing balance support: No upper extremity supported, During functional activity Standing balance-Leahy Scale: Poor Standing balance comment: able to stand at sink with no UE support during self care tasks                           ADL either performed or assessed with clinical judgement   ADL Overall ADL's : Needs assistance/impaired     Grooming: Wash/dry hands;Wash/dry face;Oral care;Contact guard assist;Standing Grooming Details (indicate cue type and reason): CGA for safety Upper Body Bathing: Contact guard assist;Standing   Lower Body Bathing: Contact guard assist;Sit to/from stand   Upper Body Dressing : Sitting;Set up Upper Body Dressing Details (indicate cue type and reason): change gown Lower Body Dressing: Supervision/safety;Sitting/lateral leans Lower Body Dressing Details (indicate cue type and reason): for shoes Toilet Transfer: Contact guard assist;Ambulation           Functional mobility during ADLs: Contact guard assist General ADL Comments: no LOB while standing at sink for self care tasks    Extremity/Trunk Assessment              Vision       Perception     Praxis     Communication Communication Communication: No apparent difficulties Factors Affecting Communication: Other (comment) (slowed speech)   Cognition Arousal: Alert Behavior During Therapy: Flat affect Cognition: History of cognitive impairments  OT - Cognition Comments: possible at baseline                 Following commands: Intact        Cueing   Cueing Techniques: Verbal  cues  Exercises      Shoulder Instructions       General Comments VSS on RA    Pertinent Vitals/ Pain       Pain Assessment Pain Assessment: No/denies pain Pain Intervention(s): Monitored during session  Home Living                                          Prior Functioning/Environment              Frequency  Min 2X/week        Progress Toward Goals  OT Goals(current goals can now be found in the care plan section)  Progress towards OT goals: Progressing toward goals  Acute Rehab OT Goals Patient Stated Goal: to go home OT Goal Formulation: With patient Time For Goal Achievement: 12/27/23 Potential to Achieve Goals: Good ADL Goals Pt Will Perform Grooming: standing;with modified independence Pt Will Perform Lower Body Bathing: with modified independence;sit to/from stand Pt Will Perform Lower Body Dressing: with modified independence;sit to/from stand Pt Will Transfer to Toilet: with modified independence;ambulating Pt Will Perform Toileting - Clothing Manipulation and hygiene: with modified independence;sit to/from stand  Plan      Co-evaluation                 AM-PAC OT 6 Clicks Daily Activity     Outcome Measure   Help from another person eating meals?: None Help from another person taking care of personal grooming?: A Little Help from another person toileting, which includes using toliet, bedpan, or urinal?: A Little Help from another person bathing (including washing, rinsing, drying)?: A Little Help from another person to put on and taking off regular upper body clothing?: A Little Help from another person to put on and taking off regular lower body clothing?: A Little 6 Click Score: 19    End of Session Equipment Utilized During Treatment: Gait belt;Rolling walker (2 wheels)  OT Visit Diagnosis: Muscle weakness (generalized) (M62.81);Other abnormalities of gait and mobility (R26.89)   Activity Tolerance Patient  tolerated treatment well   Patient Left in chair;with call bell/phone within reach;with chair alarm set   Nurse Communication Mobility status        Time: 9247-9177 OT Time Calculation (min): 30 min  Charges: OT General Charges $OT Visit: 1 Visit OT Treatments $Self Care/Home Management : 23-37 mins  Dick Laine, OTA Acute Rehabilitation Services  Office [phone]   Jeb LITTIE Laine 12/19/2023, 10:50 AM

## 2023-12-19 NOTE — Progress Notes (Signed)
 Attempted to call his wife twice regarding discharge with no any response.

## 2023-12-19 NOTE — Discharge Summary (Signed)
 Calvin Escobar FMW:[id number] DOB: 18-Nov-1950 DOA: 12/11/2023  PCP: [name], MD  Admit date: 12/11/2023  Discharge date: 12/19/2023  Admitted From: Home   Disposition:  Home   Recommendations for Outpatient Follow-up:   Follow up with PCP in 1-2 weeks  PCP Please obtain BMP/CBC, 2 view CXR in 1week,  (see Discharge instructions)   PCP Please follow up on the following pending results: Monitor CBC, BMP, fluid status and diuretic dose closely, needs close outpatient follow-up with general surgery, ID, cardiology and urology.   Home Health: PT, RN Equipment/Devices: As below Consultations: ID, general surgery, urology, cardiology Discharge Condition: Stable     CODE STATUS: DNR Diet Recommendation: Heart Healthy 1.5 L fluid restriction per day    No chief complaint on file.    Brief history of present illness from the day of admission and additional interim summary    73 y.o.  male with history of colovesical fistula-numerous recent hospitalizations for sepsis related to UTI/bacteremia-just discharged from this facility on 7/5 for candidemia on oral Diflucan -presented to the hospital with fever-found to have COVID-19 infection and E. coli bacteremia.   Significant events: 7/9>> admit to [facility]-fever-COVID-19 infection/E. coli bacteremia.  Post admission-had an episode of respiratory distress/bronchospasm-started on BiPAP-transfer to progressive care. 7/10>> liberated off BiPAP-on room air-another episode of respiratory distress later in the afternoon-on BiPAP-given Lasix /steroids/bronchodilators. 7/11>> taken off BiPAP this morning-on 1-2 L of oxygen.  Calm/comfortable-not in any distress.   Significant studies: 6/30>> CT abdomen/pelvis: Marked bladder wall thickening with surrounding  edema/fluid-suspected tract from sigmoid colon to posterior bladder.   Significant microbiology data: 7/9>> COVID PCR: Positive 7/9>> influenza /RSV PCR: Negative. 7/9>> blood gram culture: E Coli   Procedures: 7/3>> colonoscopy-diverticulosis, severe luminal narrowing distal sigmoid TTE   1. Left ventricular ejection fraction, by estimation, is 25 to 30%. The left ventricle has severely decreased function. The left ventricle demonstrates global hypokinesis. There is mild left ventricular hypertrophy. Left ventricular diastolic parameters  are indeterminate.  2. Right ventricular systolic function is normal. The right ventricular size is normal. There is mildly elevated pulmonary artery systolic pressure. The estimated right ventricular systolic pressure is 43.9 mmHg.  3. The mitral valve is normal in structure. Mild to moderate mitral valve regurgitation.  4. The aortic valve is tricuspid. Aortic valve regurgitation is not visualized. No aortic stenosis is present.  5. Aortic dilatation noted. There is dilatation of the ascending aorta, measuring 41 mm.  6. The inferior vena cava is dilated in size with <50% respiratory variability, suggesting right atrial pressure of 15 mmHg   Consults: ID Cards CCS                                                                 Hospital Course   Severe sepsis secondary to  polymicrobial bloodstream infection (candidemia on 6/18 and E. coli bacteremia 7/9)-present on admission (fever/tachypnea/tachycardia/leukocytosis with evidence of AKI) Sepsis physiology has resolved-leukocytosis is likely from steroid. Polymicrobial bloodstream infection-likely secondary to symptomatic chronic colovesical fistula Already on fluconazole  from prior admission, now on IV Rocephin . Per general surgery now outpatient follow-up with Dr. Sheldon for elective surgical procedure. ID has placed him on Augmentin  with stop date of 12/25/2023, currently stable and symptom-free will be  discharged home.  With outpatient follow-up with general surgery, ID and PCP.   COVID-19 infection Incidental no clinical signs or symptoms of active COVID-19 infection, asymptomatic and remains on room air.   AKI on CKD stage IIIa with hyperkalemia, baseline creatinine appears to be close to 1.4 AKI likely hemodynamic mediated in the setting of sepsis-possible CHF. Improved with supportive care and diuretics.  Received Lokelma  for hyperkalemia which has improved as well.   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  Required IV diuretics and BiPAP, now stable on room air on 12/14/2023, continue supportive care, cardiology following.  Currently on Coreg  monitor, as needed Lasix , repeat dose on 12/16/2023.  Shortness of breath much improved with diuresis, stable on room air, rapidly tapered off steroids.  Postdischarge follow-up with cardiology was seen by cardiology here as well.   Normocytic anemia Due to combination of acute illness/ongoing CKD Follow CBC   Elevated D-dimer.  Likely due to sepsis.  Negative venous duplex, no chest pain, currently no hypoxia, echo with relatively stable right-sided physiology clinically no PE.     Incidental finding of right popliteal cyst.  Pain-free.  Outpatient orthopedics follow-up.     History of colovesical fistula Likely cause of recurrent infections/bacteremia/candidemia S/p recent colonoscopy-with sigmoid stricture-suspected to be the site of fistula. CCS on board as above   History of BPH - History of urethral stricture-s/p dilated by Dr. Alvaro on 6/28-with chronic indwelling Foley catheter  Recommendations by urology (consult note on 6/28) were to keep Foley catheter in place-monthly changes recommended.  Will follow-up with urology postdischarge.   History of mood disorder  Stable, Abilify /BuSpar /trazodone /Effexor    History of intention tremor  Primidone .   Class 1 Obesity:  BMI of 30.  Follow-up with PCP for weight loss.   Discharge diagnosis     Principal Problem:   COVID-19 Active Problems:   SIRS (systemic inflammatory response syndrome) (HCC)   Leukocytosis   Acute kidney injury superimposed on chronic kidney disease (HCC)   Hyperkalemia   Hypocalcemia   Candidemia (HCC)   Anemia of chronic disease   BPH with urinary obstruction   Anxiety   Bipolar disorder (HCC)   Dementia (HCC)   HLD (hyperlipidemia)   Colovesical fistula   Chronic HFrEF (heart failure with reduced ejection fraction) [facility])    Discharge instructions    Discharge Instructions     Discharge instructions   Complete by: As directed    Follow with Primary MD [name], MD in 7 days, follow-up with your general surgeon and cardiologist within a week of discharge as well.  Get CBC, CMP, Magnesium , 2 view Chest X ray -  checked next visit with your primary MD   Activity: As tolerated with Full fall precautions use walker/cane & assistance as needed  Disposition Home   Diet: Heart Healthy  Check your Weight same time everyday, if you gain over 2 pounds, or you develop in leg swelling, experience more shortness of breath or chest pain, call your  Primary MD immediately. Follow Cardiac Low Salt Diet and 1.5 lit/day fluid restriction.  Special Instructions: If you have smoked or chewed Tobacco  in the last 2 yrs please stop smoking, stop any regular Alcohol  and or any Recreational drug use.  On your next visit with your primary care physician please Get Medicines reviewed and adjusted.  Please request your Prim.MD to go over all Hospital Tests and Procedure/Radiological results at the follow up, please get all Hospital records sent to your Prim MD by signing hospital release before you go home.  If you experience worsening of your admission symptoms, develop shortness of breath, life threatening emergency, suicidal or homicidal thoughts you must seek medical attention  immediately by calling 911 or calling your MD immediately  if symptoms less severe.  You Must read complete instructions/literature along with all the possible adverse reactions/side effects for all the Medicines you take and that have been prescribed to you. Take any new Medicines after you have completely understood and accpet all the possible adverse reactions/side effects.   Do not drive when taking Pain medications.  Do not take more than prescribed Pain, Sleep and Anxiety Medications  Wear Seat belts while driving.   Increase activity slowly   Complete by: As directed        Discharge Medications   Allergies as of 12/19/2023       Reactions   Albuterol  Palpitations   Supraventricular Tachycardia         Medication List     STOP taking these medications    losartan  50 MG tablet Commonly known as: COZAAR    midodrine  5 MG tablet Commonly known as: PROAMATINE        TAKE these medications    amoxicillin -clavulanate 875-125 MG tablet Commonly known as: AUGMENTIN  Take 1 tablet by mouth every 12 (twelve) hours for 6 days. Stop date 7/23   ARIPiprazole  2 MG tablet Commonly known as: ABILIFY  Take 1 tablet (2 mg total) by mouth in the morning.   busPIRone  10 MG tablet Commonly known as: BUSPAR  Take 1 tablet (10 mg total) by mouth 3 (three) times daily.   carvedilol  6.25 MG tablet Commonly known as: COREG  Take 1 tablet (6.25 mg total) by mouth 2 (two) times daily. What changed:  medication strength how much to take   docusate sodium  100 MG capsule Commonly known as: COLACE Take 2 capsules (200 mg total) by mouth 2 (two) times daily as needed for mild constipation.   finasteride  5 MG tablet Commonly known as: PROSCAR  Take 5 mg by mouth daily.   fluconazole  200 MG tablet Commonly known as: DIFLUCAN  Take 2 tablets (400 mg total) by mouth daily for 14 days.   folic acid  1 MG tablet Commonly known as: FOLVITE  Take 1 mg by mouth daily.   furosemide  40 MG  tablet Commonly known as: Lasix  Take 1 tablet (40 mg total) by mouth daily as needed for fluid or edema (For weight gain more than 2 pounds from your baseline over 24 hours).   gabapentin  100 MG capsule Commonly known as: NEURONTIN  Take 1 capsule (100 mg total) by mouth 3 (three) times daily.   melatonin 3 MG Tabs tablet Take 1 tablet (3 mg total) by mouth at bedtime as needed (insomnia). What changed: when to take this   mirtazapine  30 MG tablet Commonly known as: REMERON  Take 1 tablet (30 mg total) by mouth at bedtime.   pravastatin  20 MG tablet Commonly known as: PRAVACHOL  Take 1 tablet (20 mg  total) by mouth at bedtime.   primidone  50 MG tablet Commonly known as: MYSOLINE  Take 1 tablet (50 mg total) by mouth 2 (two) times daily. What changed: when to take this   tamsulosin  0.4 MG Caps capsule Commonly known as: FLOMAX  TAKE 1 CAPSULE BY MOUTH EVERY DAY   traZODone  100 MG tablet Commonly known as: DESYREL  Take 1 tablet (100 mg total) by mouth at bedtime.   venlafaxine  XR 150 MG 24 hr capsule Commonly known as: EFFEXOR -XR Take 150 mg by mouth daily.               Durable Medical Equipment  (From admission, onward)           Start     Ordered   12/18/23 0857  For home use only DME Walker rolling  Once       Comments: 5 wheel  Question Answer Comment  Walker: With 5 Inch Wheels   Patient needs a walker to treat with the following condition Weakness      12/18/23 0856             Follow-up Information     Sheldon Standing, MD. Call.   Specialties: General Surgery, Colon and Rectal Surgery Why: Our office should reach out in regards to scheduling surgery for colon resection with takedown of colovesical fistula. They will let you know if you need to come in to the office for a follow up visit prior to surgery Contact information: [address] [place] [address] [phone]         [name], MD. Schedule an appointment  as soon as possible for a visit in 1 week(s).   Specialty: Internal Medicine Contact information: [address] [place] [address] [phone]         Alvaro Ricardo KATHEE Mickey., MD. Schedule an appointment as soon as possible for a visit in 1 week(s).   Specialty: Urology Why: Indwelling Foley catheter Contact information: [address] [place] [address] [phone]         [name], MD. Schedule an appointment as soon as possible for a visit in 1 week(s).   Specialty: Cardiology Contact information: [address] [place] [address] [phone]                 Major procedures and Radiology Reports - PLEASE review detailed and final reports thoroughly  -       VAS US  LOWER EXTREMITY VENOUS (DVT) Result Date: 12/15/2023  Lower Venous DVT Study Patient Name:  Calvin Escobar [facility]  Date of Exam:   12/14/2023 Medical Rec #: [id number]           Accession #:    [id number] Date of Birth: 1951-06-03           Patient Gender: M Patient Age:   57 years Exam Location:  [facility] Procedure:      VAS US  LOWER EXTREMITY VENOUS (DVT) Referring Phys: DONALDA APPLEBAUM --------------------------------------------------------------------------------  Indications: Swelling, SOB, and Covid.  Risk Factors: HFrEF, NICM, CHF, history of candidemia during prior admission 12/02/23-12/07/2023, readmission for fever, tachycardia and new Ecoli and Covid infection. AKI, CKD IIIa. Limitations: Poor ultrasound/tissue interface and Patient sleeping with restless legs. Comparison Study: No prior study on file Performing Technologist: Alberta Lis RVS  Examination Guidelines: A complete evaluation includes B-mode imaging, spectral Doppler, color Doppler, and power Doppler as needed of all accessible portions of each vessel. Bilateral testing is considered  an integral part of a complete examination. Limited examinations for reoccurring indications may be performed as noted. The  reflux portion of the exam is performed with the patient in reverse Trendelenburg.  +---------+---------------+---------+-----------+----------+--------------+ RIGHT    CompressibilityPhasicitySpontaneityPropertiesThrombus Aging +---------+---------------+---------+-----------+----------+--------------+ CFV      Full           Yes      No                                  +---------+---------------+---------+-----------+----------+--------------+ SFJ      Full                                                        +---------+---------------+---------+-----------+----------+--------------+ FV Prox  Full           Yes      No                                  +---------+---------------+---------+-----------+----------+--------------+ FV Mid   Full           Yes      No                                  +---------+---------------+---------+-----------+----------+--------------+ FV DistalFull           Yes      No                                  +---------+---------------+---------+-----------+----------+--------------+ PFV      Full           Yes      No                                  +---------+---------------+---------+-----------+----------+--------------+ POP      Full           Yes      No                                  +---------+---------------+---------+-----------+----------+--------------+ PTV      Full                                                        +---------+---------------+---------+-----------+----------+--------------+ PERO     Full                                                        +---------+---------------+---------+-----------+----------+--------------+ Gastroc  Full                                                        +---------+---------------+---------+-----------+----------+--------------+  Large cystic structure containing mixed echoes noted in the popliteal fossa measuring 4.35 x 2.81 cm.   +---------+---------------+---------+-----------+----------+--------------+ LEFT     CompressibilityPhasicitySpontaneityPropertiesThrombus Aging +---------+---------------+---------+-----------+----------+--------------+ CFV      Full           Yes      No                                  +---------+---------------+---------+-----------+----------+--------------+ SFJ      Full                                                        +---------+---------------+---------+-----------+----------+--------------+ FV Prox  Full           Yes      No                                  +---------+---------------+---------+-----------+----------+--------------+ FV Mid   Full                                                        +---------+---------------+---------+-----------+----------+--------------+ FV DistalFull           Yes      No                                  +---------+---------------+---------+-----------+----------+--------------+ PFV      Full                                                        +---------+---------------+---------+-----------+----------+--------------+ POP      Full           Yes      No                                  +---------+---------------+---------+-----------+----------+--------------+ PTV      Full                                                        +---------+---------------+---------+-----------+----------+--------------+ PERO     Full                                                        +---------+---------------+---------+-----------+----------+--------------+     Summary: BILATERAL: - No obvious evidence of deep 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 - RIGHT: - Large cystic structure containing mixed echoes noted in the popliteal fossa measuring 4.35 x  2.81 cm.   *See table(s) above for measurements and observations. Electronically signed by Gaile New MD on 12/15/2023 at 2:05:48 PM.     Final    DG Chest Port 1 View Result Date: 12/14/2023 CLINICAL DATA:  Shortness of breath EXAM: PORTABLE CHEST 1 VIEW COMPARISON:  12/12/2023 FINDINGS: 0.6 cm right mid lung nodule no change from 2017, considered benign. Low lung volumes are present, causing crowding of the pulmonary vasculature. Lower thoracic spondylosis. New hazy density peripherally at the left lung base may represent superimposition of osseous shadows, although early pneumonia or atelectasis could have a similar appearance. This could be in the lingula or left lower lobe. Degenerative glenohumeral arthropathy, left greater than right. IMPRESSION: 1. New hazy density peripherally at the left lung base may represent superimposition of osseous shadows, although early pneumonia or atelectasis could have a similar appearance. This could be in the lingula or left lower lobe. 2. Low lung volumes are present, causing crowding of the pulmonary vasculature. 3. Lower thoracic spondylosis. 4. Degenerative glenohumeral arthropathy, left greater than right. Electronically Signed   By: Ryan Salvage M.D.   On: 12/14/2023 10:37   ECHOCARDIOGRAM COMPLETE Result Date: 12/13/2023    ECHOCARDIOGRAM REPORT   Patient Name:   Calvin Escobar [facility] Date of Exam: 12/13/2023 Medical Rec #:  [id number]          Height:       72.0 in Accession #:    [id number]         Weight:       238.1 lb Date of Birth:  May 08, 1951          BSA:          2.294 m Patient Age:    73 years           BP:           91/60 mmHg Patient Gender: M                  HR:           63 bpm. Exam Location:  Inpatient Procedure: 2D Echo, Color Doppler and Cardiac Doppler (Both Spectral and Color            Flow Doppler were utilized during procedure). Indications:    Dyspnea  History:        Patient has prior history of Echocardiogram examinations, most                 recent 05/23/2023. Risk Factors:HDL.  Sonographer:    Benard Stallion Referring Phys: [id number] DONALDA M GHIMIRE IMPRESSIONS  1.  Left ventricular ejection fraction, by estimation, is 25 to 30%. The left ventricle has severely decreased function. The left ventricle demonstrates global hypokinesis. There is mild left ventricular hypertrophy. Left ventricular diastolic parameters  are indeterminate.  2. Right ventricular systolic function is normal. The right ventricular size is normal. There is mildly elevated pulmonary artery systolic pressure. The estimated right ventricular systolic pressure is 43.9 mmHg.  3. The mitral valve is normal in structure. Mild to moderate mitral valve regurgitation.  4. The aortic valve is tricuspid. Aortic valve regurgitation is not visualized. No aortic stenosis is present.  5. Aortic dilatation noted. There is dilatation of the ascending aorta, measuring 41 mm.  6. The inferior vena cava is dilated in size with <50% respiratory variability, suggesting right atrial pressure of 15 mmHg. FINDINGS  Left Ventricle: Left ventricular ejection fraction, by estimation, is 25 to 30%. The left  ventricle has severely decreased function. The left ventricle demonstrates global hypokinesis. The left ventricular internal cavity size was normal in size. There is mild left ventricular hypertrophy. Left ventricular diastolic parameters are indeterminate. Right Ventricle: The right ventricular size is normal. No increase in right ventricular wall thickness. Right ventricular systolic function is normal. There is mildly elevated pulmonary artery systolic pressure. The tricuspid regurgitant velocity is 2.69  m/s, and with an assumed right atrial pressure of 15 mmHg, the estimated right ventricular systolic pressure is 43.9 mmHg. Left Atrium: Left atrial size was normal in size. Right Atrium: Right atrial size was normal in size. Pericardium: There is no evidence of pericardial effusion. Mitral Valve: The mitral valve is normal in structure. Mild to moderate mitral valve regurgitation. Tricuspid Valve: The tricuspid valve is normal  in structure. Tricuspid valve regurgitation is mild. Aortic Valve: The aortic valve is tricuspid. Aortic valve regurgitation is not visualized. No aortic stenosis is present. Aortic valve mean gradient measures 2.0 mmHg. Aortic valve peak gradient measures 3.6 mmHg. Aortic valve area, by VTI measures 3.67 cm. Pulmonic Valve: The pulmonic valve was not well visualized. Pulmonic valve regurgitation is trivial. Aorta: The aortic root is normal in size and structure and aortic dilatation noted. There is dilatation of the ascending aorta, measuring 41 mm. Venous: The inferior vena cava is dilated in size with less than 50% respiratory variability, suggesting right atrial pressure of 15 mmHg. IAS/Shunts: The interatrial septum was not well visualized.  LEFT VENTRICLE PLAX 2D LVIDd:         5.40 cm   Diastology LVIDs:         4.90 cm   LV e' medial:  5.33 cm/s LV PW:         1.10 cm   LV e' lateral: 7.40 cm/s LV IVS:        1.10 cm LVOT diam:     2.20 cm LV SV:         76 LV SV Index:   33 LVOT Area:     3.80 cm  RIGHT VENTRICLE RV Basal diam:  4.10 cm RV Mid diam:    3.90 cm RV S prime:     10.40 cm/s TAPSE (M-mode): 2.2 cm LEFT ATRIUM           Index        RIGHT ATRIUM           Index LA diam:      4.50 cm 1.96 cm/m   RA Area:     22.80 cm LA Vol (A4C): 59.7 ml 26.02 ml/m  RA Volume:   64.10 ml  27.94 ml/m  AORTIC VALVE AV Area (Vmax):    3.60 cm AV Area (Vmean):   3.41 cm AV Area (VTI):     3.67 cm AV Vmax:           94.50 cm/s AV Vmean:          65.900 cm/s AV VTI:            0.208 m AV Peak Grad:      3.6 mmHg AV Mean Grad:      2.0 mmHg LVOT Vmax:         89.60 cm/s LVOT Vmean:        59.200 cm/s LVOT VTI:          0.201 m LVOT/AV VTI ratio: 0.97  AORTA Ao Root diam: 3.50 cm Ao Asc diam:  4.10 cm TRICUSPID VALVE TR  Peak grad:   28.9 mmHg TR Vmax:        269.00 cm/s  SHUNTS Systemic VTI:  0.20 m Systemic Diam: 2.20 cm Lonni Nanas MD Electronically signed by Lonni Nanas MD Signature  Date/Time: 12/13/2023/11:00:03 AM    Final    DG Chest Port 1 View Result Date: 12/12/2023 CLINICAL DATA:  Shortness of breath. EXAM: PORTABLE CHEST 1 VIEW COMPARISON:  Radiographs 12/12/2023 and 12/11/2023.  CT 03/16/2008. FINDINGS: 1648 hours. There are lower lung volumes. The heart size and mediastinal contours are stable. Increased patchy opacities at both lung bases, likely atelectasis. No confluent airspace disease, definite edema, pneumothorax or significant pleural effusion. The bones appear unchanged. IMPRESSION: Lower lung volumes with increased patchy opacities at both lung bases, likely atelectasis. Electronically Signed   By: Elsie Perone M.D.   On: 12/12/2023 16:59   DG Chest Port 1V same Day Result Date: 12/12/2023 CLINICAL DATA:  Shortness of breath EXAM: PORTABLE CHEST 1 VIEW COMPARISON:  12/11/2023 FINDINGS: Single frontal view of the chest demonstrates borderline enlargement the cardiac silhouette. No airspace disease, effusion, or pneumothorax. No acute bony abnormalities. IMPRESSION: 1. Borderline enlargement of the cardiac silhouette. 2. No acute airspace disease. Electronically Signed   By: Ozell Daring M.D.   On: 12/12/2023 09:32   DG CHEST PORT 1 VIEW Result Date: 12/11/2023 CLINICAL DATA:  [id number] SOB (shortness of breath) [id number] EXAM: PORTABLE CHEST 1 VIEW COMPARISON:  AP radiograph the chest dated December 11, 2023. FINDINGS: The heart is normal in size. Pulmonary vasculature is unremarkable. There is a calcified granuloma in the right midlung zone. The lungs appear clear otherwise. IMPRESSION: No active disease. Electronically Signed   By: Evalene Coho M.D.   On: 12/11/2023 21:04   DG Chest Port 1 View Result Date: 12/11/2023 CLINICAL DATA:  Questionable sepsis - evaluate for abnormality. Tachypnea. EXAM: PORTABLE CHEST 1 VIEW COMPARISON:  07/12/2023. FINDINGS: Stable calcified granuloma overlying the right lower lung zone, laterally. Bilateral lung fields are clear. No  acute consolidation or lung collapse. Bilateral costophrenic angles are clear. Stable cardio-mediastinal silhouette. No acute osseous abnormalities. The soft tissues are within normal limits. IMPRESSION: No active disease. Electronically Signed   By: Ree Molt M.D.   On: 12/11/2023 13:08   CT ABDOMEN PELVIS W CONTRAST Result Date: 12/02/2023 CLINICAL DATA:  Abdominal pain, hematuria. EXAM: CT ABDOMEN AND PELVIS WITH CONTRAST TECHNIQUE: Multidetector CT imaging of the abdomen and pelvis was performed using the standard protocol following bolus administration of intravenous contrast. RADIATION DOSE REDUCTION: This exam was performed according to the departmental dose-optimization program which includes automated exposure control, adjustment of the mA and/or kV according to patient size and/or use of iterative reconstruction technique. CONTRAST:  75mL OMNIPAQUE  IOHEXOL  350 MG/ML SOLN COMPARISON:  06/02/2023. FINDINGS: Lower chest: No acute findings. Heart is at the upper limits of normal in size with left ventricular dilatation. No pericardial or pleural effusion. Distal esophagus is grossly unremarkable. Hepatobiliary: Liver and gallbladder are unremarkable. No biliary ductal dilatation. Pancreas: Negative. Spleen: Negative. Adrenals/Urinary Tract: Scarring in the right kidney. Low-attenuation lesions in the kidneys. No specific follow-up necessary. Left renal edema with mild left hydronephrosis which appears to be secondary to marked bladder wall thickening. Right ureter is decompressed. Foley catheter and air are seen in the bladder. There is haziness and fluid surrounding the bladder. Stomach/Bowel: Stomach, small bowel, appendix and majority of the colon are unremarkable. There appears to be a low-attenuation tract extending from the rectosigmoid colon to the posterior  margin of the bladder (3/67-70). Vascular/Lymphatic: Atherosclerotic calcification of the aorta. No pathologically enlarged lymph nodes.  Reproductive: Prostate is visualized. Other: No free fluid.  Mesenteries and peritoneum are unremarkable. Musculoskeletal: Degenerative changes in the spine. IMPRESSION: 1. Marked bladder wall thickening with surrounding edema and fluid, indicative of severe cystitis. Associated mild left hydronephrosis. 2. Suspected tract from the sigmoid colon to the posterior bladder wall, worrisome for a chronic colovesical fistula. 3.  Aortic atherosclerosis (ICD10-I70.0). Electronically Signed   By: Newell Eke M.D.   On: 12/02/2023 09:00    Micro Results    Recent Results (from the past 240 hours)  Resp panel by RT-PCR (RSV, Flu A&B, Covid) Anterior Nasal Swab     Status: Abnormal   Collection Time: 12/11/23 12:29 PM   Specimen: Anterior Nasal Swab  Result Value Ref Range Status   SARS Coronavirus 2 by RT PCR POSITIVE (A) NEGATIVE Final   Influenza A by PCR NEGATIVE NEGATIVE Final   Influenza B by PCR NEGATIVE NEGATIVE Final    Comment: (NOTE) The Xpert Xpress SARS-CoV-2/FLU/RSV plus assay is intended as an aid in the diagnosis of influenza from Nasopharyngeal swab specimens and should not be used as a sole basis for treatment. Nasal washings and aspirates are unacceptable for Xpert Xpress SARS-CoV-2/FLU/RSV testing.  Fact Sheet for Patients: BloggerCourse.com  Fact Sheet for Healthcare Providers: SeriousBroker.it  This test is not yet approved or cleared by the [place]  FDA and has been authorized for detection and/or diagnosis of SARS-CoV-2 by FDA under an Emergency Use Authorization (EUA). This EUA will remain in effect (meaning this test can be used) for the duration of the COVID-19 declaration under Section 564(b)(1) of the Act, 21 U.S.C. section 360bbb-3(b)(1), unless the authorization is terminated or revoked.     Resp Syncytial Virus by PCR NEGATIVE NEGATIVE Final    Comment: (NOTE) Fact Sheet for  Patients: BloggerCourse.com  Fact Sheet for Healthcare Providers: SeriousBroker.it  This test is not yet approved or cleared by the [place]  FDA and has been authorized for detection and/or diagnosis of SARS-CoV-2 by FDA under an Emergency Use Authorization (EUA). This EUA will remain in effect (meaning this test can be used) for the duration of the COVID-19 declaration under Section 564(b)(1) of the Act, 21 U.S.C. section 360bbb-3(b)(1), unless the authorization is terminated or revoked.  Performed at [facility], [address], [place], [place] [address]   Blood Culture (routine x 2)     Status: Abnormal   Collection Time: 12/11/23  1:30 PM   Specimen: BLOOD RIGHT FOREARM  Result Value Ref Range Status   Specimen Description BLOOD RIGHT FOREARM  Final   Special Requests   Final    BOTTLES DRAWN AEROBIC AND ANAEROBIC Blood Culture results may not be optimal due to an inadequate volume of blood received in culture bottles   Culture  Setup Time   Final    GRAM NEGATIVE RODS IN BOTH AEROBIC AND ANAEROBIC BOTTLES CRITICAL RESULT CALLED TO, READ BACK BY AND VERIFIED WITH: MAYA AGENT LEDFORD [phone] [id number] BY JINNY COMMON, MT Performed at [facility], [address], [place], [place] [address]    Culture ESCHERICHIA COLI (A)  Final   Report Status 12/14/2023 FINAL  Final   Organism ID, Bacteria ESCHERICHIA COLI  Final   Organism ID, Bacteria ESCHERICHIA COLI  Final      Susceptibility   Escherichia coli - KIRBY BAUER*    CEFAZOLIN SENSITIVE Sensitive  Escherichia coli - MIC*    AMPICILLIN  <=2 SENSITIVE Sensitive     CEFEPIME  <=0.12 SENSITIVE Sensitive     CEFTAZIDIME <=1 SENSITIVE Sensitive     CEFTRIAXONE  <=0.25 SENSITIVE Sensitive     CIPROFLOXACIN <=0.25 SENSITIVE Sensitive     GENTAMICIN <=1 SENSITIVE Sensitive     IMIPENEM <=0.25 SENSITIVE Sensitive     TRIMETH/SULFA <=20 SENSITIVE Sensitive      AMPICILLIN /SULBACTAM <=2 SENSITIVE Sensitive     PIP/TAZO <=4 SENSITIVE Sensitive ug/mL    * ESCHERICHIA COLI    ESCHERICHIA COLI  Blood Culture ID Panel (Reflexed)     Status: Abnormal   Collection Time: 12/11/23  1:30 PM  Result Value Ref Range Status   Enterococcus faecalis NOT DETECTED NOT DETECTED Final   Enterococcus Faecium NOT DETECTED NOT DETECTED Final   Listeria monocytogenes NOT DETECTED NOT DETECTED Final   Staphylococcus species NOT DETECTED NOT DETECTED Final   Staphylococcus aureus (BCID) NOT DETECTED NOT DETECTED Final   Staphylococcus epidermidis NOT DETECTED NOT DETECTED Final   Staphylococcus lugdunensis NOT DETECTED NOT DETECTED Final   Streptococcus species NOT DETECTED NOT DETECTED Final   Streptococcus agalactiae NOT DETECTED NOT DETECTED Final   Streptococcus pneumoniae NOT DETECTED NOT DETECTED Final   Streptococcus pyogenes NOT DETECTED NOT DETECTED Final   A.calcoaceticus-baumannii NOT DETECTED NOT DETECTED Final   Bacteroides fragilis NOT DETECTED NOT DETECTED Final   Enterobacterales DETECTED (A) NOT DETECTED Final    Comment: Enterobacterales represent a large order of gram negative bacteria, not a single organism. CRITICAL RESULT CALLED TO, READ BACK BY AND VERIFIED WITH: PHARMD JAMES LEDFORD [phone] [id number] BY J RAZZAK, MT    Enterobacter cloacae complex NOT DETECTED NOT DETECTED Final   Escherichia coli DETECTED (A) NOT DETECTED Final    Comment: CRITICAL RESULT CALLED TO, READ BACK BY AND VERIFIED WITH: PHARMD JAMES LEDFORD [phone] [phone] BY J RAZZAK, MT    Klebsiella aerogenes NOT DETECTED NOT DETECTED Final   Klebsiella oxytoca NOT DETECTED NOT DETECTED Final   Klebsiella pneumoniae NOT DETECTED NOT DETECTED Final   Proteus species NOT DETECTED NOT DETECTED Final   Salmonella species NOT DETECTED NOT DETECTED Final   Serratia marcescens NOT DETECTED NOT DETECTED Final   Haemophilus influenzae NOT DETECTED NOT DETECTED Final   Neisseria  meningitidis NOT DETECTED NOT DETECTED Final   Pseudomonas aeruginosa NOT DETECTED NOT DETECTED Final   Stenotrophomonas maltophilia NOT DETECTED NOT DETECTED Final   Candida albicans NOT DETECTED NOT DETECTED Final   Candida auris NOT DETECTED NOT DETECTED Final   Candida glabrata NOT DETECTED NOT DETECTED Final   Candida krusei NOT DETECTED NOT DETECTED Final   Candida parapsilosis NOT DETECTED NOT DETECTED Final   Candida tropicalis NOT DETECTED NOT DETECTED Final   Cryptococcus neoformans/gattii NOT DETECTED NOT DETECTED Final   CTX-M ESBL NOT DETECTED NOT DETECTED Final   Carbapenem resistance IMP NOT DETECTED NOT DETECTED Final   Carbapenem resistance KPC NOT DETECTED NOT DETECTED Final   Carbapenem resistance NDM NOT DETECTED NOT DETECTED Final   Carbapenem resist OXA 48 LIKE NOT DETECTED NOT DETECTED Final   Carbapenem resistance VIM NOT DETECTED NOT DETECTED Final    Comment: Performed at [facility], [address], [place], [place] [address]  Blood Culture (routine x 2)     Status: None   Collection Time: 12/11/23  6:30 PM   Specimen: BLOOD  Result Value Ref Range Status   Specimen Description BLOOD SITE NOT SPECIFIED  Final  Special Requests   Final    BOTTLES DRAWN AEROBIC AND ANAEROBIC Blood Culture adequate volume   Culture   Final    NO GROWTH 5 DAYS Performed at [facility], [address], [place], [place] [address]    Report Status 12/16/2023 FINAL  Final    Today   Subjective    Calvin Escobar today has no headache,no chest abdominal pain,no new weakness tingling or numbness, feels much better wants to go home today.    Objective   Blood pressure 129/80, pulse 90, temperature 98.2 F (36.8 C), temperature source Oral, resp. rate (!) 23, height 6' (1.829 m), weight 107.8 kg, SpO2 93%.   Intake/Output Summary (Last 24 hours) at 12/19/2023 0848 Last data filed at 12/19/2023 0504 Gross per 24 hour  Intake 13 ml  Output 1950 ml  Net -1937 ml     Exam  Awake Alert, No new F.N deficits,    [place].AT,PERRAL Supple Neck,   Symmetrical Chest wall movement, Good air movement bilaterally, CTAB RRR,No Gallops,   +ve B.Sounds, Abd Soft, Non tender,  No Cyanosis, Clubbing or edema    Data Review   Recent Labs  Lab 12/14/23 0600 12/15/23 0821 12/16/23 0615 12/17/23 0601 12/18/23 0507  WBC 18.3* 12.7* 9.4 9.6 11.6*  HGB 7.5* 8.9* 8.4* 8.8* 9.2*  HCT 24.0* 29.6* 27.6* 28.8* 30.3*  PLT 131* 147* 151 176 171  MCV 91.6 92.5 92.9 91.4 92.4  MCH 28.6 27.8 28.3 27.9 28.0  MCHC 31.3 30.1 30.4 30.6 30.4  RDW 14.2 14.1 14.0 14.1 14.2  LYMPHSABS 0.7 1.9 2.1 2.7 3.0  MONOABS 0.8 0.6 0.4 0.5 0.7  EOSABS 0.0 0.0 0.1 0.1 0.3  BASOSABS 0.0 0.0 0.0 0.0 0.1    Recent Labs  Lab 12/13/23 0657 12/14/23 0600 12/15/23 0821 12/16/23 0615 12/17/23 0601 12/18/23 0507  NA 138 137 142 143 140 142  K 5.0 4.4 4.9 4.7 4.4 4.4  CL 106 105 110 109 103 103  CO2 21* 20* 22 27 26 27   ANIONGAP 11 12 10 7 11 12   GLUCOSE 184* 144* 100* 99 106* 101*  BUN 52* 66* 56* 43* 44* 41*  CREATININE 2.75* 2.41* 1.99* 1.85* 1.89* 1.94*  AST  --  21 23 17  14* 15  ALT  --  14 13 12 12 11   ALKPHOS  --  54 57 53 50 54  BILITOT  --  0.5 0.8 0.5 0.4 0.4  ALBUMIN   --  2.5* 2.7* 2.6* 2.5* 2.6*  CRP 22.7* 19.3* 11.5* 8.3* 5.9*  --   DDIMER 3.96*  --   --   --   --   --   PROCALCITON >150.00 114.80 52.91 22.87 9.31 4.23  TSH  --   --  2.768  --   --   --   BNP 629.9* 712.2* 1,013.6* 868.0* 742.1*  --   MG  --  2.0 2.1 2.0 1.8  --   PHOS  --  5.3* 3.4 3.1 3.8  --   CALCIUM  8.0* 7.9* 8.4* 8.5* 8.7* 8.8*    Total Time in preparing paper work, data evaluation and todays exam - 35 minutes  Signature  -    Lavada Stank M.D on 12/19/2023 at 8:48 AM   -  To page go to [email or web address]

## 2023-12-19 NOTE — TOC Transition Note (Addendum)
 Transition of Care [facility]) - Discharge Note   Patient Details  Name: Calvin Escobar MRN: [id number] Date of Birth: 10-31-1950  Transition of Care [facility]) CM/SW Contact:  Corean JAYSON Canary, RN Phone Number: 12/19/2023, 2:49 PM   Clinical Narrative:    Patient is to discharge today, So far has been unsuccessful in contacting wife for piickup. He had PT OT and RN with  [facility], [facility] is aware of DC. There was a question if the patient needs a rolling walker, no equipment recommended, by PT He is having a BSC sent to his home.  Have messaged  with nursing to see if they have heard back from his wife.  She is the only person listed on contacts. [id number] Left a message for Mrs Markin to call the hospital about DC  Final next level of care: Home w Home Health Services Barriers to Discharge: No Barriers Identified   Patient Goals and CMS Choice            Discharge Placement                       Discharge Plan and Services Additional resources added to the After Visit Summary for                            HH Arranged: PT, OT HH Agency: [facility] Date [facility] Agency Contacted: 12/16/23 Time HH Agency Contacted: 1413 Representative spoke with at [facility] Agency: [facility] (Awaiting acceptance)  Social Drivers of Health (SDOH) Interventions SDOH Screenings   Food Insecurity: No Food Insecurity (12/11/2023)  Housing: Low Risk  (12/11/2023)  Transportation Needs: No Transportation Needs (12/11/2023)  Utilities: Not At Risk (12/11/2023)  Alcohol Screen: Low Risk  (10/10/2023)  Depression (PHQ2-9): Low Risk  (12/09/2023)  Recent Concern: Depression (PHQ2-9) - Medium Risk (09/16/2023)  Financial Resource Strain: Low Risk  (10/10/2023)  Physical Activity: Sufficiently Active (10/10/2023)  Social Connections: Socially Isolated (12/11/2023)  Stress: No Stress Concern Present (10/10/2023)  Tobacco Use: Low Risk  (12/05/2023)  Health Literacy: Adequate Health Literacy (10/10/2023)      Readmission Risk Interventions    07/04/2023    1:27 PM 07/03/2023    2:48 PM 06/05/2023   10:12 AM  Readmission Risk Prevention Plan  Transportation Screening Complete Complete Complete  PCP or Specialist Appt within 5-7 Days Complete Complete   PCP or Specialist Appt within 3-5 Days   Complete  Home Care Screening Complete Complete   Medication Review (RN CM) Complete Complete   HRI or Home Care Consult   Complete  Social Work Consult for Recovery Care Planning/Counseling   Complete  Palliative Care Screening   Complete  Medication Review [facility])   Complete

## 2023-12-20 ENCOUNTER — Other Ambulatory Visit: Payer: Self-pay | Admitting: Urology

## 2023-12-20 ENCOUNTER — Telehealth: Payer: Self-pay

## 2023-12-20 NOTE — Patient Instructions (Signed)
 Visit Information  Thank you for taking time to visit with me today. Please don't hesitate to contact me if I can be of assistance to you before our next scheduled telephone appointment.  Our next appointment is by telephone on 12/23/23 at 1pm  Following is a copy of your care plan:   Goals Addressed             This Visit's Progress    VBCI Transitions of Care [facility]) Care Plan       Problems:  New admission: 7/9 - 7/17 [facility] Primary Diagnosis: COVID - 19 & UTI Patient stated several times during initial call that he was hoping to just rest today - TOC RN was unable to complete full initial assessment - Patient did allow TOC RN to help with scheduling PCP hospital follow follow up and contact [facility] to discuss when they will do ROC  - PCP visit 12/26/13 and Home Health Tuesday 12/23/23 Patient states he will call and schedule other appointments stating,  I was in the hospital for almost 9 days and I just got home yesterday late. Today I just want to rest and get used to being at home.  Unable to review medications - patient states his wife is at work and she fills his pill box   Prior Admission Information:  Recent Hospitalization for treatment of Candidemia, pt reports no issues with foley catheter, states  urine is clear SDOH barrier: transportation Patient is able to drive but has problems getting to specialist's appointments, pt is able to drive to primary care provider office, patient's spouse works full time and provides oversight for all medications, assists as needed, adult son lives with pt but does not assist  Goal:  Over the next 30 days, the patient will not experience hospital readmission  Interventions: Updated 12/20/23 after patient discharged from [facility] 12/19/23 with COVID - 19 & UTI - Unable to fully complete assessment - scheduled with Primary TOC RN, Mliss Creed Monday 12/23/23 as requested by patient as he does not want to continue today and just wants to  rest  PCP hospital follow scheduled and [facility] contacted for [facility]  Educated patient on fluid restriction and diet  Evaluation of current treatment plan related to Candidemia, self-management and patient's adherence to plan as established by provider. Discussed plans with patient for ongoing care management follow up and provided patient with direct contact information for care management team Collaborated with care guide, scheduled post hospital follow up appointment with primary care provider  12/16/23 @ 1 pm Reviewed all upcoming scheduled appointments including cardiology 7/16 and infectious disease 8/1, pt aware to scheduled appointment with general surgeon for 1-2 weeks out Reviewed signs/ symptoms of infection Placed care guide referral for transportation resources Reviewed care of foley cathter Reviewed hospital AVS with provider names, contact #, address for follow up appointments Telephone call with [facility], spoke with Deitra, they will see pt for home visit on 7/8 and call pt this afternoon to schedule a time, pt verbalizes understanding Reviewed importance of taking medications as prescribed,  unable to complete medication review due to spouse not at home, unable to reach spouse at work, unable to leave voicemail  Patient Self Care Activities:  Attend all scheduled provider appointments Attend church or other social activities Call pharmacy for medication refills 3-7 days in advance of running out of medications Call provider office for new concerns or questions  Notify RN Care Manager of TOC call rescheduling needs  Participate in Transition of Care Program/Attend TOC scheduled calls Perform all self care activities independently  Take medications as prescribed   Primary care provider appointment 12/27/23 @ 1 pm Call and schedule appointments with Sheldon Standing, MD. Call. Specialties: General Surgery, Colon and Rectal Surgery to scheduling surgery for colon resection  with takedown of colovesical fistula Manny, Ricardo KATHEE Raddle., MD Urology Indwelling foley catheter [name], MD cardiology Follow low sodium heart healthy diet with 1.5L fluid restriction   Plan:  Telephone follow up appointment with care management team member scheduled for:  12/27/23 @ 1pm The patient has been provided with contact information for the care management team and has been advised to call with any health related questions or concerns.         Patient verbalizes understanding of instructions and care plan provided today and agrees to view in MyChart. Active MyChart status and patient understanding of how to access instructions and care plan via MyChart confirmed with patient.     Telephone follow up appointment with care management team member scheduled for:12/23/23 with primary [facility] RN, Mliss Creed The patient has been provided with contact information for the care management team and has been advised to call with any health related questions or concerns.   Please call the care guide team at [phone] if you need to cancel or reschedule your appointment.   Please call the Suicide and Crisis Lifeline: 988 call 1-800-273-TALK (toll free, 24 hour hotline) call 911 if you are experiencing a Mental Health or Behavioral Health Crisis or need someone to talk to.  Shona Prow RN, CCM [place]  VBCI-Population Health RN Care Manager [phone]

## 2023-12-20 NOTE — Transitions of Care (Post Inpatient/ED Visit) (Signed)
 12/20/2023  Name: Calvin Escobar MRN: [id number] DOB: 01-12-1951  Today's TOC FU Call Status: Today's TOC FU Call Status:: Successful TOC FU Call Completed TOC FU Call Complete Date: 12/20/23 Patient's Name and Date of Birth confirmed.  Transition Care Management Follow-up Telephone Call Date of Discharge: 12/19/23 Discharge Facility: [facility]) Type of Discharge: Inpatient Admission Primary Inpatient Discharge Diagnosis:: COVID - 19 How have you been since you were released from the hospital?: Better Any questions or concerns?: No  Items Reviewed: Did you receive and understand the discharge instructions provided?: Yes Medications obtained,verified, and reconciled?: No Medications Not Reviewed Reasons:: Other: (patient states wife is at work and he is unable to review medications) Any new allergies since your discharge?: No Dietary orders reviewed?: Yes Type of Diet Ordered:: Low Sodium Heart Healthy 1.5 L fluid restriction per day - educated equals 51oz Do you have support at home?: Yes People in Home [RPT]: spouse Name of Support/Comfort Primary Source: Wife works full time but helps as needed and manages medications - puts them in pill box  Medications Reviewed Today: Medications Reviewed Today   Medications were not reviewed in this encounter     Home Care and Equipment/Supplies: Were Home Health Services Ordered?: Yes Name of Home Health Agency:: [facility] Has Agency set up a time to come to your home?: No EMR reviewed for Home Health Orders:  (Call placed to [facility] at [phone]. spoke wither Tobey who states they will be out Tuesday) Any new equipment or medical supplies ordered?: No  Functional Questionnaire: Do you need assistance with bathing/showering or dressing?: No Do you need assistance with meal preparation?: No Do you need assistance with eating?: No Do you have difficulty maintaining continence: No Do you need assistance with getting  out of bed/getting out of a chair/moving?: No Do you have difficulty managing or taking your medications?: Yes  Follow up appointments reviewed: PCP Follow-up appointment confirmed?: Yes Date of PCP follow-up appointment?: 12/27/23 Follow-up Provider: Bernard[facility] Follow-up appointment confirmed?: No Follow-Up Specialty Provider:: Patient repeatedly states he just got out of the hospital last night He wants to rest and unwind today and call call himself to schedule   Goals Addressed             This Visit's Progress    VBCI Transitions of Care (TOC) Care Plan       Problems:  New admission: 7/9 - 7/17 [facility] Primary Diagnosis: COVID - 19 & UTI Patient stated several times during initial call that he was hoping to just rest today - TOC RN was unable to complete full initial assessment - Patient did allow TOC RN to help with scheduling PCP hospital follow follow up and contact [facility] to discuss when they will do ROC  - PCP visit 12/26/13 and Home Health Tuesday 12/23/23 Patient states he will call and schedule other appointments stating,  I was in the hospital for almost 9 days and I just got home yesterday late. Today I just want to rest and get used to being at home.  Unable to review medications - patient states his wife is at work and she fills his pill box   Prior Admission Information:  Recent Hospitalization for treatment of Candidemia, pt reports no issues with foley catheter, states  urine is clear SDOH barrier: transportation Patient is able to drive but has problems getting to specialist's appointments, pt is able to drive to primary care provider office, patient's spouse works full  time and provides oversight for all medications, assists as needed, adult son lives with pt but does not assist  Goal:  Over the next 30 days, the patient will not experience hospital readmission  Interventions: Updated 12/20/23 after patient discharged from [facility] 12/19/23 with COVID - 19 & UTI - Unable to fully complete assessment - scheduled with Primary TOC RN, Mliss Creed Monday 12/23/23 as requested by patient as he does not want to continue today and just wants to rest  PCP hospital follow scheduled and [facility] contacted for [facility]  Educated patient on fluid restriction and diet  Evaluation of current treatment plan related to Candidemia, self-management and patient's adherence to plan as established by provider. Discussed plans with patient for ongoing care management follow up and provided patient with direct contact information for care management team Collaborated with care guide, scheduled post hospital follow up appointment with primary care provider  12/16/23 @ 1 pm Reviewed all upcoming scheduled appointments including cardiology 7/16 and infectious disease 8/1, pt aware to scheduled appointment with general surgeon for 1-2 weeks out Reviewed signs/ symptoms of infection Placed care guide referral for transportation resources Reviewed care of foley cathter Reviewed hospital AVS with provider names, contact #, address for follow up appointments Telephone call with [facility], spoke with Deitra, they will see pt for home visit on 7/8 and call pt this afternoon to schedule a time, pt verbalizes understanding Reviewed importance of taking medications as prescribed,  unable to complete medication review due to spouse not at home, unable to reach spouse at work, unable to leave voicemail  Patient Self Care Activities:  Attend all scheduled provider appointments Attend church or other social activities Call pharmacy for medication refills 3-7 days in advance of running out of medications Call provider office for new concerns or questions  Notify RN Care Manager of TOC call rescheduling needs Participate in Transition of Care Program/Attend [facility] scheduled calls Perform all self care activities independently  Take medications as  prescribed   Primary care provider appointment 12/27/23 @ 1 pm Call and schedule appointments with Sheldon Standing, MD. Call. 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           Shona Prow RN, CCM   VBCI-Population Health RN Care Manager [phone]

## 2023-12-22 DIAGNOSIS — B377 Candidal sepsis: Secondary | ICD-10-CM | POA: Diagnosis not present

## 2023-12-22 DIAGNOSIS — N1831 Chronic kidney disease, stage 3a: Secondary | ICD-10-CM | POA: Diagnosis not present

## 2023-12-22 DIAGNOSIS — R0603 Acute respiratory distress: Secondary | ICD-10-CM | POA: Diagnosis not present

## 2023-12-22 DIAGNOSIS — U071 COVID-19: Secondary | ICD-10-CM | POA: Diagnosis not present

## 2023-12-22 DIAGNOSIS — R6520 Severe sepsis without septic shock: Secondary | ICD-10-CM | POA: Diagnosis not present

## 2023-12-22 DIAGNOSIS — I5043 Acute on chronic combined systolic (congestive) and diastolic (congestive) heart failure: Secondary | ICD-10-CM | POA: Diagnosis not present

## 2023-12-22 DIAGNOSIS — N136 Pyonephrosis: Secondary | ICD-10-CM | POA: Diagnosis not present

## 2023-12-22 DIAGNOSIS — D631 Anemia in chronic kidney disease: Secondary | ICD-10-CM | POA: Diagnosis not present

## 2023-12-22 DIAGNOSIS — A4151 Sepsis due to Escherichia coli [E. coli]: Secondary | ICD-10-CM | POA: Diagnosis not present

## 2023-12-23 ENCOUNTER — Encounter: Payer: Self-pay | Admitting: *Deleted

## 2023-12-23 ENCOUNTER — Telehealth: Payer: Self-pay | Admitting: *Deleted

## 2023-12-23 ENCOUNTER — Encounter: Admitting: Gastroenterology

## 2023-12-24 ENCOUNTER — Telehealth: Payer: Self-pay | Admitting: *Deleted

## 2023-12-24 NOTE — Patient Instructions (Signed)
 Visit Information  Thank you for taking time to visit with me today. Please don't hesitate to contact me if I can be of assistance to you before our next scheduled telephone appointment.  Following are the goals we discussed today:   Goals Addressed             This Visit's Progress    VBCI Transitions of Care [facility]) Care Plan       Problems:  New admission: 7/9 - 7/17 Calvin Escobar Primary Diagnosis: COVID - 19 & UTI Patient stated several times during initial call that he was hoping to just rest today - TOC RN was unable to complete full initial assessment - Patient did allow TOC RN to help with scheduling PCP hospital follow follow up and contact [facility] to discuss when they will do ROC  - PCP visit 12/26/13 and Home Health Tuesday 12/23/23 Patient states he will call and schedule other appointments stating,  I was in the hospital for almost 9 days and I just got home yesterday late. Today I just want to rest and get used to being at home.  Unable to review medications - patient states his wife is at work and she fills his pill box  12/24/23- spoke with patient who reports  feeling much better   has intermittent cough, expectorating small amounts of phlegm, no fever or signs of infection, home health nurse is working with pt and providing oversight with medications per pt, no issues reported with foley catheter  Prior Admission Information:  Recent Hospitalization for treatment of Candidemia, pt reports no issues with foley catheter, states  urine is clear SDOH barrier: transportation Patient is able to drive but has problems getting to specialist's appointments, pt is able to drive to primary care provider office, patient's spouse works full time and provides oversight for all medications, assists as needed, adult son lives with pt but does not assist  Goal:  Over the next 30 days, the patient will not experience hospital readmission  Interventions: Covid 19 & UTI Evaluation of  current treatment plan related to Covid 19 & UTI, self-management and patient's adherence to plan as established by provider. Discussed plans with patient for ongoing care management follow up and provided patient with direct contact information for care management team Reviewed all upcoming scheduled appointments including primary care provider on 12/27/23 @ 1 pm Reinforced signs/ symptoms of infection Reinforced care of foley catheter Verified with pt, home health is active Reviewed signs /symptoms of respiratory infection, reportable signs/ symptoms  Patient Self Care Activities:  Attend all scheduled provider appointments Attend church or other social activities Call pharmacy for medication refills 3-7 days in advance of running out of medications Call provider office for new concerns or questions  Notify RN Care Manager of TOC call rescheduling needs Participate in Transition of Care Program/Attend TOC scheduled calls Perform all self care activities independently  Take medications as prescribed   Primary care provider appointment 12/27/23 @ 1 pm Call and schedule appointments with Sheldon Standing, MD. Call. 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          Our next appointment is by telephone on 12/31/23 at 115 pm  Please call the care guide team at [phone] if you need to cancel or reschedule your appointment.   If you are experiencing a Mental Health or Behavioral  Health Crisis or need someone to talk to, please call the Suicide and Crisis Lifeline: 988 call the [place]  National Suicide Prevention Lifeline: (346) 046-3670 or TTY: 959-118-2401 TTY 585-446-2051) to talk to a trained counselor call 1-800-273-TALK (toll free, 24 hour hotline) go to [facility] [address], [place] [phone]) call 911   Patient verbalizes understanding of instructions and care plan provided today and agrees to view in MyChart. Active MyChart status and patient understanding of how to access instructions and care plan via MyChart confirmed with patient.     Telephone follow up appointment with care management team member scheduled for:  12/31/23 @ 115 pm  Mliss Creed [facility], BSN RN Care Manager/ Transition of Care [place]/ [facility] [phone]

## 2023-12-24 NOTE — Patient Outreach (Signed)
 Transition of Care week 2  Visit Note  12/24/2023  Name: Calvin Escobar MRN: [id number]          DOB: September 21, 1950  Situation: Patient enrolled in [facility] 30-day program. Visit completed with patient by telephone.   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 History of GI bleeding (07/2023). Currently on ferrous sulfate  supplementation.   DIAGNOSTIC ASSESSMENT: ? Primary etiology (70-80% probability): Anemia of chronic disease/inflammation ? Secondary considerations: Iron deficiency component (elevated RD   DCM (dilated cardiomyopathy) (HCC)    nonischemic with normal coronary arteries at cath 06/2019.  EF 35 to 40% on echo 04/2021   Dementia (HCC)    Depression    Diarrhea 05/13/2023   E coli bacteremia 05/14/2023   High anion gap metabolic acidosis 07/13/2023   History of Clostridioides difficile colitis 08/15/2023   Associated with colovesical fistula? 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  Endoscopy esophageal stricture 2016     Septic shock from UTI 06/02/2023   Severe sepsis (HCC) 06/03/2023   SKIN CANCER, HX OF 05/10/2007   Facial left check and forehead and r shoulder  F/w derm   Syncope 05/13/2023   Thrombocytopenia (HCC) 06/02/2023   Lab Results      Component    Value    Date            PLT    301    07/19/2023           PLT    324    07/18/2023           PLT    268    07/16/2023           PLT    207    07/14/2023           PLT    228    07/13/2023             Lab Results      Component    Value    Date           ESRSEDRATE    60 (H)    05/08/2023     No results found for: CRP          Lab Results      Component    Value       Urinary catheter complication (HCC) 06/24/2023   Urinary dribbling 03/14/2022   Urinary incontinence 11/21/2022      Urinary Incontinence: They are wearing disposable diapers daily due to urinary incontinence, primarily nocturnal, and are currently on Tamsulosin  (Flomax ). We will refer them to Urology for further evaluation and potential treatment options and continue Tamsulosin  until they are seen by Urology.     Urinary tract infection without hematuria 06/02/2023   UTI (urinary tract  infection) 07/01/2023    Assessment: Patient Reported Symptoms: Cognitive Cognitive Status: No symptoms reported, Able to follow simple commands, Normal speech and language skills Cognitive/Intellectual Conditions Management [RPT]: Other Other: memory issues at times      Neurological Neurological Review of Symptoms: No symptoms reported    HEENT HEENT Symptoms Reported: No symptoms reported      Cardiovascular Cardiovascular Symptoms Reported: No symptoms reported Does patient have uncontrolled Hypertension?: No Cardiovascular Comment: HF- pt is not weighing, not interested,   reinforced HF action plan  Respiratory Respiratory Symptoms Reported: Productive cough Other Respiratory Symptoms: pt has intermittent cough, expectorating small amounts phleghm (white color) Additional Respiratory Details: reviewed signs/ symptoms of infection Respiratory Management Strategies: Adequate rest Respiratory Self-Management Outcome: 4 (good)  Endocrine Endocrine Symptoms Reported: No symptoms reported Is patient diabetic?: No    Gastrointestinal Gastrointestinal  Symptoms Reported: No symptoms reported      Genitourinary Genitourinary Symptoms Reported: No symptoms reported Additional Genitourinary Details: pt has indwelling foley catheter, patent, draining well Genitourinary Management Strategies: Catheter, indwelling Genitourinary Self-Management Outcome: 3 (uncertain) Genitourinary Comment: home health nurse is working with pt  Integumentary Integumentary Symptoms Reported: No symptoms reported    Musculoskeletal Musculoskelatal Symptoms Reviewed: No symptoms reported        Psychosocial Psychosocial Symptoms Reported: No symptoms reported         There were no vitals filed for this visit.  Medications Reviewed Today     Reviewed by Aura Mliss LABOR, RN (Registered Nurse) on 12/24/23 at 1005  Med List Status: <None>   Medication Order Taking? Sig Documenting Provider Last Dose Status Informant  amoxicillin -clavulanate (AUGMENTIN ) 875-125 MG tablet [id number] Yes Take 1 tablet by mouth every 12 (twelve) hours for 6 days. Stop date 7/23 Singh, Prashant K, MD  Active   ARIPiprazole  (ABILIFY ) 2 MG tablet [id number] Yes Take 1 tablet (2 mg total) by mouth in the morning. [name], MD  Active Pharmacy Records, Self           Med Note STEFFI, A[place]RIA   Tue Dec 03, 2023  3:10 PM) Recent Dispenses    11/15/2023 2 MG TABS (disp 90, 90d supply) 08/22/2023 2 MG TABS (disp 90, 90d supply)    busPIRone  (BUSPAR ) 10 MG tablet [id number] Yes Take 1 tablet (10 mg total) by mouth 3 (three) times daily. [name], MD  Active Pharmacy Records, Self           Med Note STEFFI, [place]   Tue Dec 03, 2023  3:10 PM) Recent Dispenses    09/16/2023 10 MG TABS (disp 270, 90d supply) 07/13/2023 10 MG TABS (disp 180, 60d supply) 05/14/2023 10 MG TABS (disp 180, 60d supply)    carvedilol  (COREG ) 6.25 MG tablet [id number] Yes Take 1 tablet (6.25 mg total) by mouth 2 (two) times daily. Singh, Prashant K, MD  Active   docusate sodium  (COLACE)  100 MG capsule [id number] Yes Take 2 capsules (200 mg total) by mouth 2 (two) times daily as needed for mild constipation. Singh, Prashant K, MD  Active   finasteride  (PROSCAR ) 5 MG tablet [id number] Yes Take 5 mg by mouth daily. [provider]  Active Pharmacy Records, Self           Med Note STEFFI, [place]A   Tue Dec 03, 2023  3:09 PM) Recent Dispenses    11/06/2023 5 MG TABS (disp 90, 90d supply)    folic acid  (FOLVITE ) 1 MG tablet [id number] Yes Take 1 mg by  mouth daily. [provider]  Active Pharmacy Records, Self           Med Note STEFFI, [place]   Tue Dec 03, 2023  3:08 PM) Recent Dispenses    10/21/2023 1 MG TABS (disp 90, 90d supply) 06/22/2023 1 MG TABS (disp 90, 90d supply) 03/07/2023 1 MG TABS (disp 90, 90d supply)    furosemide  (LASIX ) 40 MG tablet [id number] Yes Take 1 tablet (40 mg total) by mouth daily as needed for fluid or edema (For weight gain more than 2 pounds from your baseline over 24 hours). Singh, Prashant K, MD  Active   gabapentin  (NEURONTIN ) 100 MG capsule [id number] Yes Take 1 capsule (100 mg total) by mouth 3 (three) times daily. [name], MD  Active Pharmacy Records, Self           Med Note STEFFI, ADELITA   Tue Dec 03, 2023  3:08 PM) Recent Dispenses    11/10/2023 100 MG CAPS (disp 90, 30d supply) 10/12/2023 100 MG CAPS (disp 90, 30d supply)    melatonin 3 MG TABS tablet [id number] Yes Take 1 tablet (3 mg total) by mouth at bedtime as needed (insomnia). Davia Nydia POUR, MD  Active Pharmacy Records, Self  mirtazapine  (REMERON ) 30 MG tablet [id number] Yes Take 1 tablet (30 mg total) by mouth at bedtime. [name], MD  Active Pharmacy Records, Self           Med Note STEFFI, ADELITA   Tue Dec 03, 2023  3:07 PM) Recent Dispenses    11/15/2023 30 MG TABS (disp 90, 90d supply) 08/22/2023 30 MG TABS (disp 90, 90d supply)    pravastatin  (PRAVACHOL ) 20 MG tablet [id number] Yes Take 1 tablet (20 mg total) by  mouth at bedtime. [name], MD  Active Pharmacy Records, Self           Med Note STEFFI, ADELITA   Tue Dec 03, 2023  3:07 PM) Recent Dispenses 10/28/2023 20 MG TABS (disp 90, 90d supply) 07/27/2023 20 MG TABS (disp 90, 90d supply) 07/02/2023 20 MG TABS (disp 25, 25d supply)     primidone  (MYSOLINE ) 50 MG tablet [id number] Yes Take 1 tablet (50 mg total) by mouth 2 (two) times daily. Verdene Purchase, MD  Active Pharmacy Records, Self           Med Note STEFFI, ADELITA   Tue Dec 03, 2023  3:06 PM) Recent Dispenses   08/29/2023 50 MG TABS (disp 270, 90d supply) 06/09/2023 50 MG TABS (disp 270, 90d supply)     tamsulosin  (FLOMAX ) 0.4 MG CAPS capsule [id number] Yes TAKE 1 CAPSULE BY MOUTH EVERY DAY [name], MD  Active Pharmacy Records, Self           Med Note STEFFI, ADELITA   Tue Dec 03, 2023  3:06 PM) Recent Dispenses    10/26/2023 0.4 MG CAPS (disp 180, 90d supply) 07/23/2023 0.4 MG CAPS (disp 90, 90d supply)    traZODone  (DESYREL ) 100 MG tablet [id number] Yes Take 1 tablet (100 mg total) by mouth at bedtime. Davia Nydia POUR, MD  Active Pharmacy Records, Self           Med Note STEFFI, ADELITA   Tue Dec 03, 2023  3:06 PM) Recent Dispenses    07/12/2023 100 MG TABS (disp 90, 30d supply) 06/14/2023 100 MG TABS (disp 90, 30d supply)    venlafaxine  XR (EFFEXOR -XR) 150 MG 24 hr capsule [id number] Yes Take 150 mg by mouth daily.  [provider]  Active Pharmacy Records, Self           Med Note STEFFI NIAN   Tue Dec 03, 2023  3:06 PM) Recent Dispenses    11/11/2023 75 MG CP24 (disp 90, 90d supply) 10/04/2023 150 MG CP24 (disp 90, 90d supply) 08/14/2023 75 MG CP24 (disp 90, 90d supply) 07/09/2023 150 MG CP24 (disp 90, 90d supply)    Med List Note STEFFI NIAN, CPhT 12/04/23 0919): Call spouse and leave VM             Recommendation:   PCP Follow-up  Follow Up Plan:   Telephone follow-up 12/31/23 @ 115 pm  Mliss Creed [facility], BSN RN Care Manager/ Transition of Care [place]/ [facility] Population Health [phone]

## 2023-12-24 NOTE — Telephone Encounter (Signed)
 Copied from CRM [id number]. Topic: Clinical - Home Health Verbal Orders >> Dec 24, 2023  9:20 AM Deaijah H wrote: Caller/Agency: Hadassah PT w/ [facility] Callback Number: [phone] (secured line) Service Requested: Physical Therapy Frequency: 1x a week for 9wks Any new concerns about the patient? No  Verbal orders given to [facility] for PT as requested.

## 2023-12-25 ENCOUNTER — Other Ambulatory Visit: Payer: Self-pay | Admitting: Urology

## 2023-12-25 DIAGNOSIS — N130 Hydronephrosis with ureteropelvic junction obstruction: Secondary | ICD-10-CM | POA: Diagnosis not present

## 2023-12-25 DIAGNOSIS — N401 Enlarged prostate with lower urinary tract symptoms: Secondary | ICD-10-CM | POA: Diagnosis not present

## 2023-12-25 DIAGNOSIS — R338 Other retention of urine: Secondary | ICD-10-CM | POA: Diagnosis not present

## 2023-12-25 DIAGNOSIS — N321 Vesicointestinal fistula: Secondary | ICD-10-CM | POA: Diagnosis not present

## 2023-12-26 DIAGNOSIS — R0603 Acute respiratory distress: Secondary | ICD-10-CM | POA: Diagnosis not present

## 2023-12-26 DIAGNOSIS — U071 COVID-19: Secondary | ICD-10-CM | POA: Diagnosis not present

## 2023-12-26 DIAGNOSIS — A4151 Sepsis due to Escherichia coli [E. coli]: Secondary | ICD-10-CM | POA: Diagnosis not present

## 2023-12-26 DIAGNOSIS — B377 Candidal sepsis: Secondary | ICD-10-CM | POA: Diagnosis not present

## 2023-12-26 DIAGNOSIS — R6520 Severe sepsis without septic shock: Secondary | ICD-10-CM | POA: Diagnosis not present

## 2023-12-26 DIAGNOSIS — D631 Anemia in chronic kidney disease: Secondary | ICD-10-CM | POA: Diagnosis not present

## 2023-12-26 DIAGNOSIS — I5043 Acute on chronic combined systolic (congestive) and diastolic (congestive) heart failure: Secondary | ICD-10-CM | POA: Diagnosis not present

## 2023-12-26 DIAGNOSIS — N136 Pyonephrosis: Secondary | ICD-10-CM | POA: Diagnosis not present

## 2023-12-26 DIAGNOSIS — N1831 Chronic kidney disease, stage 3a: Secondary | ICD-10-CM | POA: Diagnosis not present

## 2023-12-27 ENCOUNTER — Ambulatory Visit (INDEPENDENT_AMBULATORY_CARE_PROVIDER_SITE_OTHER): Admitting: Internal Medicine

## 2023-12-27 ENCOUNTER — Telehealth: Payer: Self-pay | Admitting: *Deleted

## 2023-12-27 ENCOUNTER — Encounter: Payer: Self-pay | Admitting: Internal Medicine

## 2023-12-27 VITALS — BP 122/80 | HR 92 | Temp 98.8°F | Ht 72.0 in | Wt 228.8 lb

## 2023-12-27 DIAGNOSIS — F3131 Bipolar disorder, current episode depressed, mild: Secondary | ICD-10-CM

## 2023-12-27 DIAGNOSIS — N321 Vesicointestinal fistula: Secondary | ICD-10-CM | POA: Diagnosis not present

## 2023-12-27 DIAGNOSIS — N35919 Unspecified urethral stricture, male, unspecified site: Secondary | ICD-10-CM

## 2023-12-27 DIAGNOSIS — Z79899 Other long term (current) drug therapy: Secondary | ICD-10-CM

## 2023-12-27 DIAGNOSIS — N390 Urinary tract infection, site not specified: Secondary | ICD-10-CM | POA: Diagnosis not present

## 2023-12-27 DIAGNOSIS — T839XXD Unspecified complication of genitourinary prosthetic device, implant and graft, subsequent encounter: Secondary | ICD-10-CM | POA: Diagnosis not present

## 2023-12-27 DIAGNOSIS — N1831 Chronic kidney disease, stage 3a: Secondary | ICD-10-CM

## 2023-12-27 LAB — CBC WITH DIFFERENTIAL/PLATELET
Basophils Absolute: 0.1 K/uL (ref 0.0–0.1)
Basophils Relative: 1.3 % (ref 0.0–3.0)
Eosinophils Absolute: 0.3 K/uL (ref 0.0–0.7)
Eosinophils Relative: 4.7 % (ref 0.0–5.0)
HCT: 25.7 % — ABNORMAL LOW (ref 39.0–52.0)
Hemoglobin: 8.2 g/dL — ABNORMAL LOW (ref 13.0–17.0)
Lymphocytes Relative: 22.8 % (ref 12.0–46.0)
Lymphs Abs: 1.4 K/uL (ref 0.7–4.0)
MCHC: 32 g/dL (ref 30.0–36.0)
MCV: 87.9 fl (ref 78.0–100.0)
Monocytes Absolute: 0.6 K/uL (ref 0.1–1.0)
Monocytes Relative: 10 % (ref 3.0–12.0)
Neutro Abs: 3.8 K/uL (ref 1.4–7.7)
Neutrophils Relative %: 61.2 % (ref 43.0–77.0)
Platelets: 179 K/uL (ref 150.0–400.0)
RBC: 2.92 Mil/uL — ABNORMAL LOW (ref 4.22–5.81)
RDW: 14.4 % (ref 11.5–15.5)
WBC: 6.2 K/uL (ref 4.0–10.5)

## 2023-12-27 LAB — COMPREHENSIVE METABOLIC PANEL WITH GFR
ALT: 8 U/L (ref 0–53)
AST: 15 U/L (ref 0–37)
Albumin: 3.4 g/dL — ABNORMAL LOW (ref 3.5–5.2)
Alkaline Phosphatase: 56 U/L (ref 39–117)
BUN: 21 mg/dL (ref 6–23)
CO2: 27 meq/L (ref 19–32)
Calcium: 8.6 mg/dL (ref 8.4–10.5)
Chloride: 105 meq/L (ref 96–112)
Creatinine, Ser: 1.77 mg/dL — ABNORMAL HIGH (ref 0.40–1.50)
GFR: 37.21 mL/min — ABNORMAL LOW (ref >60.00)
Glucose, Bld: 108 mg/dL — ABNORMAL HIGH (ref 70–99)
Potassium: 4 meq/L (ref 3.5–5.1)
Sodium: 141 meq/L (ref 135–145)
Total Bilirubin: 0.4 mg/dL (ref 0.2–1.2)
Total Protein: 6.6 g/dL (ref 6.0–8.3)

## 2023-12-27 NOTE — Telephone Encounter (Signed)
 Pt's son stopped by the office want to give Dr. Jesus information about his father. He said that he is worry his dad is not going to share everything about his health with dr. Jesus. He stated that he is concerned about the way his dad act these days and his eating habits. He said dad drinks juice and soda most of the day, he don't want to eat, and he barely take a cup or 2 of water/day. Pt's son is concerned if the medication is affecting his dad's appetite. Pt is schedule to see Dr. Jesus this afternoon @1 .

## 2023-12-27 NOTE — Progress Notes (Signed)
 ==============================  [facility]: [phone]   -- Medical Office Visit --  Patient: Calvin Escobar      Age: 73 y.o.       Sex:  male  Date:   12/27/2023 Today's Healthcare Provider: Bernardino KANDICE Cone, MD  ==============================   Chief Complaint: Hospital follow-up and discomfort related to Foley catheter that was placed as part of hospitalization  Discussed the use of AI scribe software for clinical note transcription with the patient, who gave verbal consent to proceed.  History of Present Illness  73 year old male who presents for primary care follow-up after recent hospitalizations for fungal infection and COVID-19.  He was hospitalized twice recently, initially for a fungal infection in his blood, treated with caspofungin. During this hospitalization, his eyes were checked for fungal involvement and were found to be clear. He also received a catheter due to concerns about a bladder issue. During this time, he contracted COVID-19, necessitating a return to the hospital for a few days.  He currently has a Foley catheter in place and has a history of recurrent infections, including a recent fungal infection. An ultrasound previously showed a large ball of fungus in his bladder. The catheter insertion caused injury, resulting in hematuria and discomfort.  He is on multiple medications, including amoxicillin  for a potential urinary tract infection due to the catheter injury. He takes amoxicillin , fluconazole , and other medications for various conditions, including blood pressure and prostate health. He is concerned about the number of medications and their side effects.  He has a history of urinary issues, including difficulty urinating, which led to the initial placement of the Foley catheter. He has experienced multiple hospitalizations for severe urinary tract infections and a recent fungal infection, believed to be related to  the fistula. The catheter has improved his urination but has caused discomfort and bleeding due to injury during insertion.  Updated Problem List Entries: Problem  Stricture of Male Urethra   Requires indwelling foley managed by urology.   Chronic Hfref (Heart Failure With Reduced Ejection Fraction) (Hcc)   TTE 12/05/2023  1. Left ventricular ejection fraction, by estimation, is 25 to 30%. The left ventricle has severely decreased function. The left ventricle demonstrates global hypokinesis. There is mild left ventricular hypertrophy. Left ventricular diastolic parameters  are indeterminate.  2. Right ventricular systolic function is normal. The right ventricular size is normal. There is mildly elevated pulmonary artery systolic pressure. The estimated right ventricular systolic pressure is 43.9 mmHg.  3. The mitral valve is normal in structure. Mild to moderate mitral valve regurgitation.  4. The aortic valve is tricuspid. Aortic valve regurgitation is not visualized. No aortic stenosis is present.  5. Aortic dilatation noted. There is dilatation of the ascending aorta, measuring 41 mm.  6. The inferior vena cava is dilated in size with <50% respiratory variability, suggesting right atrial pressure of 15 mmHg   Hypocalcemia   Lab Results  Component Value Date/Time   CALCIUM  8.6 12/27/2023 01:20 PM   CALCIUM  8.8 (L) 12/18/2023 05:07 AM   CALCIUM  8.7 (L) 12/17/2023 06:01 AM   CALCIUM  8.5 (L) 12/16/2023 06:15 AM   CALCIUM  8.4 (L) 12/15/2023 08:21 AM    Vitamin D  Levels Lab Results  Component Value Date/Time   VD25OH 36.65 08/14/2023 01:30 PM      Obesity (Bmi 30-39.9)  Anemia of Chronic Disease   CLINICAL CONTEXT: 73 year old male with multiple inflammatory conditions including recurrent UTIs, Stage 3 CKD,  and 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 History of GI bleeding (07/2023). Currently on ferrous sulfate  supplementation.  DIAGNOSTIC ASSESSMENT: ? Primary etiology (70-80% probability): Anemia  of chronic disease/inflammation ? Secondary considerations: Iron deficiency component (elevated RDW), chronic blood loss (GI/GU), erythropoietin deficiency from CKD ? Lab findings: Low RBC (3.61), low Hgb (10.1), low Hct (31.7%), elevated RDW (17.3%), normal MCV (87.7) ? Previous iron studies: Elevated ferritin [phone]), low TIBC/transferrin (low iron-binding capacity)   >>OVERVIEW FOR NORMOCYTIC ANEMIA WRITTEN ON 08/14/2023  1:10 PM BY [name], MD  Had blood loss in urine ?anemia of chronic disease   Lab Results  Component Value Date/Time   HGB 9.4 (L) 07/19/2023 07:29 AM   HGB 9.4 (L) 07/18/2023 07:57 AM   HGB 8.6 (L) 07/16/2023 04:10 AM   HGB 8.2 (L) 07/14/2023 02:17 AM   HGB 8.8 (L) 07/13/2023 07:39 AM   HGB 14.1 10/28/2019 04:29 PM   Lab Results  Component Value Date/Time   MCV 87.4 07/19/2023 07:29 AM   MCV 87.9 07/18/2023 07:57 AM   MCV 86.1 07/16/2023 04:10 AM   MCV 84.6 07/14/2023 02:17 AM   MCV 85.2 07/13/2023 07:39 AM   MCV 92 10/28/2019 04:29 PM      Component Value Date/Time   IRON 26 (L) 07/17/2023 1103   TIBC 143 (L) 07/17/2023 1103   FERRITIN 1,278 (H) 07/17/2023 1103   IRONPCTSAT 18 07/17/2023 1103    Lab Results  Component Value Date   RETICCTPCT 1.6 07/17/2023   RETICCTABS 73,000 05/08/2023   PATHREVIEW  05/08/2023     Comment:     Myeloid population consists predominantly of mature segmented neutrophils with mild left shift and reactive changes. Anemia with RBCs which appear to be borderline microcytic and hypochromic on smear review. Suggest evaluation for iron deficiency, if clinically indicated. Platelet clumps noted on smear-count appears increased. Reviewed by Barabara MOTE Sharron, MD  (Electronic Signature on File)     05/09/2023 As of June 17, 2023, test code 833, Pathologist Review of Peripheral Smear will be discontinued. Per previous communication sent Apr 16, 2023, this update is to further align with standard  processes as well as allow us  to provide more accurate and timely result reporting. Please consider ordering the standard CBC with differential/Platelet, test code [phone], for your testing needs.       Hypertension   continues on, as of 12/28/2023 Current hypertension medications:       Sig   carvedilol  (COREG ) 6.25 MG tablet (Taking) Take 1 tablet (6.25 mg total) by mouth 2 (two) times daily.   furosemide  (LASIX ) 40 MG tablet (Taking As Needed) Take 1 tablet (40 mg total) by mouth daily as needed for fluid or edema (For weight gain more than 2 pounds from your baseline over 24 hours).       BP Readings from Last 30 Encounters:  09/16/23 100/66  09/10/23 114/70  08/14/23 98/69  07/19/23 120/72  07/12/23 124/72  07/05/23 127/84  06/24/23 110/84  06/11/23 102/77  05/16/23 (!) 130/56  05/08/23 103/65  03/07/23 131/85  02/25/23 123/80  01/15/23 100/67  12/19/22 (!) 114/90  11/21/22 112/80  07/18/22 120/86  05/14/22 118/69  05/04/22 122/78  03/14/22 105/74  04/18/21 120/82  04/14/21 118/72  09/09/20 120/84  02/19/20 122/84  10/28/19 132/82  09/29/19 130/88  08/14/19 126/82  07/30/19 125/81  07/03/19 121/66  02/03/16 (!) 143/95  09/12/13 136/77        Bph (Benign Prostatic Hyperplasia)   Dribbling/nocturia wears diapers or special underwear  It gets less wet as long as he stays on Flomax /tamsulosin  No results found for: PSA1, PSA Benign Prostatic Hyperplasia (N40.1) Updated: 08/14/2023 Status: Active, Contributing to Complications History of BPH contributing to urinary retention and complicated UTIs. Previously required catheterization for obstruction. PSA remains normal at 0.61 ng/mL. Currently on tamsulosin  0.4 mg daily with symptomatic improvement but anatomic obstruction likely persists. Plan: Continue tamsulosin  0.4 mg daily. Urology referral to evaluate for possible surgical intervention given complications from obstruction. Monitor post-void residual  volumes. Education provided regarding voiding techniques and schedules.   Bipolar Affective Disorder, Depressed (Hcc)   Bipolar Affective Disorder, Depressed (F31.81) Updated: 08/14/2023 Status: Stable Long-standing history of bipolar disorder, currently in depressed phase exacerbated by family stressors, particularly caring for son with autism. Patient reports using walking and music as effective coping mechanisms. Currently managed with multiple psychiatric medications including aripiprazole  2 mg daily, divalproex  ER 500 mg at bedtime, buspirone  10 mg three times daily, trazodone  100 mg at bedtime, mirtazapine  30 mg at bedtime, and venlafaxine  XR 300 mg daily. Plan: Continue current psychiatric medication regimen. Support patient's positive coping strategies. Coordinate care with psychiatrist. Education provided regarding stress management techniques. Monitor for changes in mood or functioning.   Now following with [name] psychiatry Current Medications:   zolpidem  10 mg daily trazodone  100 mg twice daily venlafaxine  150 mg daily lorazepam  1 mg  3 times daily 1 in the morning 2 at bedtime, buspirone . History of lithium  toxicity -currently not on mood stabilizer  Noted history of bipolar 1 in chart. And self endorses the diagnosis  Following with [name]/classes  Following with [facility] [place] [facility]  Used to following with [facility] with Dr. Bishop but felt it wasn't for him. Following with peer support Cathlyn Garbe who manages 2 of the classes he goes to. 3-4 classes a week.  anxiety not well controlled long term; very stressful triggered by negative world news.    Covid-19 (Resolved)  Sirs (Systemic Inflammatory Response Syndrome) (Hcc) (Resolved)  Leukocytosis (Resolved)   Lab Results  Component Value Date/Time   WBC 6.2 12/27/2023 01:20 PM   WBC 11.6 (H) 12/18/2023 05:07 AM   WBC 9.6 12/17/2023 06:01 AM   WBC 9.4 12/16/2023 06:15 AM   WBC  12.7 (H) 12/15/2023 08:21 AM      Colonization With Vre (Vancomycin -Resistant Enterococcus) (Resolved)  Candida Uti (Resolved)  Candidemia (Hcc) (Resolved)  Anxiety (Resolved)  C. Difficile Diarrhea (Resolved)  Candiduria (Resolved)  Acute Kidney Injury Superimposed On Chronic Kidney Disease (Hcc) (Resolved)   CLINICAL CONTEXT: 73 year old male with stage 3 CKD, colovesical fistula, recurrent UTIs, and recent candidal UTI. Recent renal ultrasound (09/28/2023) confirmed mild left hydronephrosis and prostatic mass protruding into bladder base. Iron studies consistent with anemia of chronic disease (iron 32, TIBC 238, ferritin 420).  DIAGNOSTIC ASSESSMENT: ? Worsening renal function with significant increase in creatinine (1.27 ? 1.58) and decline in GFR (55 ? 42) over one month period ? Persistent urinary inflammation with pyuria (WBC 40-60) despite negative bacterial culture ? Suspected persistent funguria despite fluconazole  treatment course; standard urine culture may not detect fungi ? Iron studies pattern consistent with anemia of chronic inflammation (low iron/TIBC, high ferritin)   Lab Results  Component Value Date/Time   CREATININE 1.60 (H) 11/01/2023 01:39 PM   CREATININE 1.52 (H) 10/25/2023 03:55 PM   CREATININE 1.58 (H) 10/18/2023 02:06 PM   CREATININE 1.27 09/24/2023 10:33 AM   CREATININE 1.16 09/16/2023 03:34 PM   CREATININE 1.29  08/14/2023 01:30 PM   Lab Results  Component Value Date   GFR 42.05 (L) 11/01/2023   GFR 42.70 (L) 10/18/2023   GFR 55.53 (L) 09/24/2023   GFR 61.91 09/16/2023   GFR 54.54 (L) 08/14/2023   EGFR 48 (L) 10/25/2023   EGFR 81 04/14/2021   EGFR 63 09/09/2020      Pac (Premature Atrial Contraction) (Resolved)   Formatting of this note might be different from the original. Work-up with cardiology in January 2021.    catheterization   Assessment:  1. Normal coronaries 2. NICM with EF 25-30% 3. Normal hemodynamics .   Hyperkalemia  (Resolved)   Associated with losartan  Advised patient low potassium diet 08/2022  Lab Results  Component Value Date/Time   K 4.0 12/27/2023 01:20 PM   K 4.4 12/18/2023 05:07 AM   K 4.4 12/17/2023 06:01 AM   K 4.7 12/16/2023 06:15 AM   K 4.9 12/15/2023 08:21 AM   K 4.4 12/14/2023 06:00 AM   K 5.0 12/13/2023 06:57 AM        Background Reviewed: Problem List: has Bipolar affective disorder, depressed (HCC); Chronic bronchitis (HCC); Dementia (HCC); HLD (hyperlipidemia); DCM (dilated cardiomyopathy) (HCC); OSA treated with BiPAP; HFrEF (heart failure with reduced ejection fraction) (HCC); Ascending aorta dilatation (HCC); Alcoholism in remission (HCC); BPH (benign prostatic hyperplasia); Primary insomnia; Generalized arthritis; Morbid obesity (HCC); Hypertension; Limp; Gynecomastia, male; Chronic back pain; Chronic kidney disease, stage 3a (HCC); Tremor; Lack of appetite; Anemia of chronic disease; LBBB (left bundle branch block); Colovesical fistula; Recurrent UTI; GERD (gastroesophageal reflux disease); Weight loss, abnormal; Hypocalcemia; Hypoalbuminemia; Hydronephrosis of left kidney; Prostatic mass; Medication management; Hematuria; Obesity (BMI 30-39.9); Chronic HFrEF (heart failure with reduced ejection fraction) (HCC); and Stricture of male urethra on their problem list. Past Medical History:  has a past medical history of Acute cystitis (05/13/2023), Acute hyponatremia (05/13/2023), Acute kidney injury superimposed on chronic kidney disease (HCC) (05/13/2023), Acute metabolic encephalopathy (07/12/2023), Acute respiratory failure with hypoxia (HCC) (06/02/2023), AKI (acute kidney injury) (HCC) (05/13/2023), Allergy (seasonal), Anxiety (12/02/2023), Ascending aorta dilatation (HCC), C. 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), COVID-19 (12/11/2023), DCM (dilated cardiomyopathy) (HCC), Dementia (HCC), Depression, Diarrhea (05/13/2023), E coli bacteremia (05/14/2023), High anion gap metabolic acidosis (07/13/2023), History of Clostridioides difficile colitis (08/15/2023), Hyperkalemia, Hyperlipidemia, Leukocytosis (12/11/2023), Lithium  toxicity (01/29/2016), OSA treated with BiPAP, PAC (premature atrial contraction) (07/17/2019), PUD (peptic ulcer disease) (01/12/2015), Septic shock from UTI (06/02/2023), Severe sepsis (HCC) (06/03/2023), SIRS (systemic inflammatory response syndrome) (HCC) (12/11/2023), SKIN CANCER, HX OF (05/10/2007), Syncope (05/13/2023), Thrombocytopenia (HCC) (06/02/2023), Urinary catheter complication (HCC) (06/24/2023), Urinary dribbling (03/14/2022), Urinary incontinence (11/21/2022), Urinary tract infection without hematuria (06/02/2023), and UTI (urinary tract infection) (07/01/2023). Past Surgical History:   has a past surgical history that includes Colonoscopy; RIGHT/LEFT HEART CATH AND CORONARY ANGIOGRAPHY (N/A, 07/03/2019); and Colonoscopy (N/A, 12/05/2023). Social History:   reports that he has never smoked. He has never used smokeless tobacco. He reports that he does not currently use alcohol after a past usage of about 14.0 standard drinks of alcohol per week. He reports that he does not use drugs. Family History:  family history includes Bone cancer in his sister; Cancer in his father; Hypertension in his mother. Allergies:  is allergic to albuterol .   Medication Reconciliation: Current Outpatient Medications on File Prior to Visit  Medication Sig   ARIPiprazole  (ABILIFY ) 2 MG tablet Take 1  tablet (2 mg total) by mouth in the morning.   busPIRone  (BUSPAR ) 10 MG tablet Take 1 tablet (10 mg total) by mouth 3 (three) times daily.   carvedilol  (COREG ) 6.25 MG tablet Take 1 tablet (6.25 mg total) by mouth 2 (two) times daily.   docusate sodium  (COLACE)  100 MG capsule Take 2 capsules (200 mg total) by mouth 2 (two) times daily as needed for mild constipation.   finasteride  (PROSCAR ) 5 MG tablet Take 5 mg by mouth daily.   folic acid  (FOLVITE ) 1 MG tablet Take 1 mg by mouth daily.   furosemide  (LASIX ) 40 MG tablet Take 1 tablet (40 mg total) by mouth daily as needed for fluid or edema (For weight gain more than 2 pounds from your baseline over 24 hours).   gabapentin  (NEURONTIN ) 100 MG capsule Take 1 capsule (100 mg total) by mouth 3 (three) times daily.   melatonin 3 MG TABS tablet Take 1 tablet (3 mg total) by mouth at bedtime as needed (insomnia).   mirtazapine  (REMERON ) 30 MG tablet Take 1 tablet (30 mg total) by mouth at bedtime.   pravastatin  (PRAVACHOL ) 20 MG tablet Take 1 tablet (20 mg total) by mouth at bedtime.   primidone  (MYSOLINE ) 50 MG tablet Take 1 tablet (50 mg total) by mouth 2 (two) times daily.   tamsulosin  (FLOMAX ) 0.4 MG CAPS capsule TAKE 1 CAPSULE BY MOUTH EVERY DAY   traZODone  (DESYREL ) 100 MG tablet Take 1 tablet (100 mg total) by mouth at bedtime.   venlafaxine  XR (EFFEXOR -XR) 75 MG 24 hr capsule Take 75 mg by mouth daily.   venlafaxine  XR (EFFEXOR -XR) 150 MG 24 hr capsule Take 150 mg by mouth daily.   No current facility-administered medications on file prior to visit.  There are no discontinued medications.   Physical Exam:    12/27/2023   12:58 PM 12/19/2023    2:50 PM 12/19/2023    9:00 AM  Vitals with BMI  Height 6' 0    Weight 228 lbs 13 oz    BMI 31.02    Systolic 122 134 877  Diastolic 80 98 82  Pulse 92 76 87  Vital signs reviewed.  Nursing notes reviewed. Weight trend reviewed. Physical Exam General Appearance:  No acute distress appreciable.   Well-groomed, healthy-appearing male.  Well proportioned with no abnormal fat distribution.  Good muscle tone. Pulmonary:  Normal work of breathing at rest, no respiratory distress apparent. SpO2: 96 %  Musculoskeletal: All extremities are intact.   Neurological:  Awake, alert, oriented, and engaged.  No obvious focal neurological deficits or cognitive impairments.  Sensorium seems unclouded.   Speech is clear and coherent with logical content. Psychiatric:  Appropriate mood, pleasant and cooperative demeanor, thoughtful and engaged during the exam Physical Exam GENITOURINARY: Hematuria observed in urine tubing.  Results:    12/09/2023    2:29 PM 10/10/2023   11:58 AM 09/16/2023    2:10 PM 03/07/2023    1:35 PM  PHQ 2/9 Scores  PHQ - 2 Score 0 0 2 1  PHQ- 9 Score  0 7 4     Results for orders placed or performed in visit on 12/27/23  Comprehensive metabolic panel with GFR  Result Value Ref Range   Sodium 141 135 - 145 mEq/L   Potassium 4.0 3.5 - 5.1 mEq/L   Chloride 105 96 - 112 mEq/L   CO2 27 19 - 32 mEq/L   Glucose, Bld 108 (H) 70 - 99 mg/dL  BUN 21 6 - 23 mg/dL   Creatinine, Ser 8.22 (H) 0.40 - 1.50 mg/dL   Total Bilirubin 0.4 0.2 - 1.2 mg/dL   Alkaline Phosphatase 56 39 - 117 U/L   AST 15 0 - 37 U/L   ALT 8 0 - 53 U/L   Total Protein 6.6 6.0 - 8.3 g/dL   Albumin  3.4 (L) 3.5 - 5.2 g/dL   GFR 62.78 (L) >39.99 mL/min   Calcium  8.6 8.4 - 10.5 mg/dL  CBC with Differential/Platelet  Result Value Ref Range   WBC 6.2 4.0 - 10.5 K/uL   RBC 2.92 (L) 4.22 - 5.81 Mil/uL   Hemoglobin 8.2 Repeated and verified X2. (L) 13.0 - 17.0 g/dL   HCT 74.2 (L) 60.9 - 47.9 %   MCV 87.9 78.0 - 100.0 fl   MCHC 32.0 30.0 - 36.0 g/dL   RDW 85.5 88.4 - 84.4 %   Platelets 179.0 150.0 - 400.0 K/uL   Neutrophils Relative % 61.2 43.0 - 77.0 %   Lymphocytes Relative 22.8 12.0 - 46.0 %   Monocytes Relative 10.0 3.0 - 12.0 %   Eosinophils Relative 4.7 0.0 - 5.0 %   Basophils Relative 1.3 0.0 - 3.0 %   Neutro Abs 3.8 1.4 - 7.7 K/uL   Lymphs Abs 1.4 0.7 - 4.0 K/uL   Monocytes Absolute 0.6 0.1 - 1.0 K/uL   Eosinophils Absolute 0.3 0.0 - 0.7 K/uL   Basophils Absolute 0.1 0.0 - 0.1 K/uL  Urinalysis w microscopic + reflex cultur   Specimen: Urine   Result Value Ref Range   Color, Urine YELLOW YELLOW   APPearance CLEAR CLEAR   Specific Gravity, Urine 1.012 1.001 - 1.035   pH 7.5 5.0 - 8.0   Glucose, UA NEGATIVE NEGATIVE   Bilirubin Urine NEGATIVE NEGATIVE   Ketones, ur NEGATIVE NEGATIVE   Hgb urine dipstick 3+ (A) NEGATIVE   Protein, ur 3+ (A) NEGATIVE   Nitrites, Initial NEGATIVE NEGATIVE   Leukocyte Esterase 1+ (A) NEGATIVE   WBC, UA 10-20 (A) 0 - 5 /HPF   RBC / HPF PACKED (A) 0 - 2 /HPF   Squamous Epithelial / HPF NONE SEEN < OR = 5 /HPF   Bacteria, UA FEW (A) NONE SEEN /HPF   Hyaline Cast 0-5 (A) NONE SEEN /LPF   Note    REFLEXIVE URINE CULTURE  Result Value Ref Range   REFLEXIVE URINE CULTURE     Office Visit on 12/27/2023  Component Date Value Ref Range Status   Sodium 12/27/2023 141  135 - 145 mEq/L Final   Potassium 12/27/2023 4.0  3.5 - 5.1 mEq/L Final   Chloride 12/27/2023 105  96 - 112 mEq/L Final   CO2 12/27/2023 27  19 - 32 mEq/L Final   Glucose, Bld 12/27/2023 108 (H)  70 - 99 mg/dL Final   BUN 92/74/7974 21  6 - 23 mg/dL Final   Creatinine, Ser 12/27/2023 1.77 (H)  0.40 - 1.50 mg/dL Final   Total Bilirubin 12/27/2023 0.4  0.2 - 1.2 mg/dL Final   Alkaline Phosphatase 12/27/2023 56  39 - 117 U/L Final   AST 12/27/2023 15  0 - 37 U/L Final   ALT 12/27/2023 8  0 - 53 U/L Final   Total Protein 12/27/2023 6.6  6.0 - 8.3 g/dL Final   Albumin  12/27/2023 3.4 (L)  3.5 - 5.2 g/dL Final   GFR 92/74/7974 37.21 (L)  >60.00 mL/min Final   Calcium  12/27/2023 8.6  8.4 - 10.5  mg/dL Final   WBC 92/74/7974 6.2  4.0 - 10.5 K/uL Final   RBC 12/27/2023 2.92 (L)  4.22 - 5.81 Mil/uL Final   Hemoglobin 12/27/2023 8.2 Repeated and verified X2. (L)  13.0 - 17.0 g/dL Final   HCT 92/74/7974 25.7 (L)  39.0 - 52.0 % Final   MCV 12/27/2023 87.9  78.0 - 100.0 fl Final   MCHC 12/27/2023 32.0  30.0 - 36.0 g/dL Final   RDW 92/74/7974 14.4  11.5 - 15.5 % Final   Platelets 12/27/2023 179.0  150.0 - 400.0 K/uL Final   Neutrophils  Relative % 12/27/2023 61.2  43.0 - 77.0 % Final   Lymphocytes Relative 12/27/2023 22.8  12.0 - 46.0 % Final   Monocytes Relative 12/27/2023 10.0  3.0 - 12.0 % Final   Eosinophils Relative 12/27/2023 4.7  0.0 - 5.0 % Final   Basophils Relative 12/27/2023 1.3  0.0 - 3.0 % Final   Neutro Abs 12/27/2023 3.8  1.4 - 7.7 K/uL Final   Lymphs Abs 12/27/2023 1.4  0.7 - 4.0 K/uL Final   Monocytes Absolute 12/27/2023 0.6  0.1 - 1.0 K/uL Final   Eosinophils Absolute 12/27/2023 0.3  0.0 - 0.7 K/uL Final   Basophils Absolute 12/27/2023 0.1  0.0 - 0.1 K/uL Final   Color, Urine 12/27/2023 YELLOW  YELLOW Final   APPearance 12/27/2023 CLEAR  CLEAR Final   Specific Gravity, Urine 12/27/2023 1.012  1.001 - 1.035 Final   pH 12/27/2023 7.5  5.0 - 8.0 Final   Glucose, UA 12/27/2023 NEGATIVE  NEGATIVE Final   Bilirubin Urine 12/27/2023 NEGATIVE  NEGATIVE Final   Ketones, ur 12/27/2023 NEGATIVE  NEGATIVE Final   Hgb urine dipstick 12/27/2023 3+ (A)  NEGATIVE Final   Protein, ur 12/27/2023 3+ (A)  NEGATIVE Final   Nitrites, Initial 12/27/2023 NEGATIVE  NEGATIVE Final   Leukocyte Esterase 12/27/2023 1+ (A)  NEGATIVE Final   WBC, UA 12/27/2023 10-20 (A)  0 - 5 /HPF Final   RBC / HPF 12/27/2023 PACKED (A)  0 - 2 /HPF Final   Squamous Epithelial / HPF 12/27/2023 NONE SEEN  < OR = 5 /HPF Final   Bacteria, UA 12/27/2023 FEW (A)  NONE SEEN /HPF Final   Hyaline Cast 12/27/2023 0-5 (A)  NONE SEEN /LPF Final   Note 12/27/2023    Final   REFLEXIVE URINE CULTURE 12/27/2023    Final  No results displayed because visit has over 200 results.    Admission on 12/02/2023, Discharged on 12/07/2023  Component Date Value Ref Range Status   Color, Urine 12/02/2023 RED (A)  YELLOW Final   APPearance 12/02/2023 TURBID (A)  CLEAR Final   Specific Gravity, Urine 12/02/2023 TEST NOT REPORTED DUE TO COLOR INTERFERENCE OF URINE PIGMENT  1.005 - 1.030 Final   pH 12/02/2023 TEST NOT REPORTED DUE TO COLOR INTERFERENCE OF URINE PIGMENT  5.0  - 8.0 Final   Glucose, UA 12/02/2023 TEST NOT REPORTED DUE TO COLOR INTERFERENCE OF URINE PIGMENT (A)  NEGATIVE mg/dL Final   Hgb urine dipstick 12/02/2023 TEST NOT REPORTED DUE TO COLOR INTERFERENCE OF URINE PIGMENT (A)  NEGATIVE Final   Bilirubin Urine 12/02/2023 TEST NOT REPORTED DUE TO COLOR INTERFERENCE OF URINE PIGMENT (A)  NEGATIVE Final   Ketones, ur 12/02/2023 TEST NOT REPORTED DUE TO COLOR INTERFERENCE OF URINE PIGMENT (A)  NEGATIVE mg/dL Final   Protein, ur 93/69/7974 TEST NOT REPORTED DUE TO COLOR INTERFERENCE OF URINE PIGMENT (A)  NEGATIVE mg/dL Final   Nitrite 93/69/7974 TEST  NOT REPORTED DUE TO COLOR INTERFERENCE OF URINE PIGMENT (A)  NEGATIVE Final   Leukocytes,Ua 12/02/2023 TEST NOT REPORTED DUE TO COLOR INTERFERENCE OF URINE PIGMENT (A)  NEGATIVE Final   Sodium 12/02/2023 137  135 - 145 mmol/L Final   Potassium 12/02/2023 4.2  3.5 - 5.1 mmol/L Final   Chloride 12/02/2023 107  98 - 111 mmol/L Final   CO2 12/02/2023 22  22 - 32 mmol/L Final   Glucose, Bld 12/02/2023 117 (H)  70 - 99 mg/dL Final   BUN 93/69/7974 29 (H)  8 - 23 mg/dL Final   Creatinine, Ser 12/02/2023 1.88 (H)  0.61 - 1.24 mg/dL Final   Calcium  12/02/2023 8.9  8.9 - 10.3 mg/dL Final   GFR, Estimated 12/02/2023 37 (L)  >60 mL/min Final   Anion gap 12/02/2023 8  5 - 15 Final   WBC 12/02/2023 8.5  4.0 - 10.5 K/uL Final   RBC 12/02/2023 3.73 (L)  4.22 - 5.81 MIL/uL Final   Hemoglobin 12/02/2023 10.7 (L)  13.0 - 17.0 g/dL Final   HCT 93/69/7974 34.7 (L)  39.0 - 52.0 % Final   MCV 12/02/2023 93.0  80.0 - 100.0 fL Final   MCH 12/02/2023 28.7  26.0 - 34.0 pg Final   MCHC 12/02/2023 30.8  30.0 - 36.0 g/dL Final   RDW 93/69/7974 14.6  11.5 - 15.5 % Final   Platelets 12/02/2023 200  150 - 400 K/uL Final   nRBC 12/02/2023 0.0  0.0 - 0.2 % Final   RBC / HPF 12/02/2023 >50  0 - 5 RBC/hpf Final   WBC, UA 12/02/2023 0-5  0 - 5 WBC/hpf Final   Bacteria, UA 12/02/2023 RARE (A)  NONE SEEN Final   Squamous Epithelial / HPF  12/02/2023 0-5  0 - 5 /HPF Final   Mucus 12/02/2023 PRESENT   Final   Lactic Acid, Venous 12/02/2023 0.7  0.5 - 1.9 mmol/L Final   Specimen Description 12/02/2023 BLOOD LEFT ARM   Final   Special Requests 12/02/2023 BOTTLES DRAWN AEROBIC AND ANAEROBIC Blood Culture results may not be optimal due to an inadequate volume of blood received in culture bottles   Final   Culture 12/02/2023    Final                   Value:NO GROWTH 5 DAYS Performed at [facility], [address] [place], [place] [address]    Report Status 12/02/2023 12/07/2023 FINAL   Final   Specimen Description 12/02/2023 BLOOD RIGHT ANTECUBITAL   Final   Special Requests 12/02/2023 BOTTLES DRAWN AEROBIC AND ANAEROBIC Blood Culture adequate volume   Final   Culture 12/02/2023    Final                   Value:NO GROWTH 5 DAYS Performed at [facility], [address] [place], [place] [address]    Report Status 12/02/2023 12/07/2023 FINAL   Final   Lactic Acid, Venous 12/02/2023 0.6  0.5 - 1.9 mmol/L Final   Specimen Description 12/02/2023 BLOOD SITE NOT SPECIFIED   Final   Special Requests 12/02/2023 BOTTLES DRAWN AEROBIC ONLY Blood Culture adequate volume   Final   Culture 12/02/2023    Final                   Value:NO GROWTH 7 DAYS NO FUNGUS ISOLATED Performed at [facility], [address], [place], [place] [address]    Report  Status 12/02/2023 12/09/2023 FINAL   Final   CRP 12/02/2023 5.9 (H)  <1.0 mg/dL Final   WBC 92/98/7974 8.4  4.0 - 10.5 K/uL Final   RBC 12/03/2023 3.28 (L)  4.22 - 5.81 MIL/uL Final   Hemoglobin 12/03/2023 9.4 (L)  13.0 - 17.0 g/dL Final   HCT 92/98/7974 30.0 (L)  39.0 - 52.0 % Final   MCV 12/03/2023 91.5  80.0 - 100.0 fL Final   MCH 12/03/2023 28.7  26.0 - 34.0 pg Final   MCHC 12/03/2023 31.3  30.0 - 36.0 g/dL Final   RDW 92/98/7974 14.5  11.5 - 15.5 % Final   Platelets 12/03/2023 187  150 - 400 K/uL Final   nRBC 12/03/2023 0.0  0.0 - 0.2 % Final   Sodium 12/03/2023 137   135 - 145 mmol/L Final   Potassium 12/03/2023 3.7  3.5 - 5.1 mmol/L Final   Chloride 12/03/2023 108  98 - 111 mmol/L Final   CO2 12/03/2023 24  22 - 32 mmol/L Final   Glucose, Bld 12/03/2023 109 (H)  70 - 99 mg/dL Final   BUN 92/98/7974 25 (H)  8 - 23 mg/dL Final   Creatinine, Ser 12/03/2023 1.81 (H)  0.61 - 1.24 mg/dL Final   Calcium  12/03/2023 8.3 (L)  8.9 - 10.3 mg/dL Final   GFR, Estimated 12/03/2023 39 (L)  >60 mL/min Final   Anion gap 12/03/2023 5  5 - 15 Final   WBC 12/04/2023 9.6  4.0 - 10.5 K/uL Final   RBC 12/04/2023 3.32 (L)  4.22 - 5.81 MIL/uL Final   Hemoglobin 12/04/2023 9.6 (L)  13.0 - 17.0 g/dL Final   HCT 92/97/7974 30.2 (L)  39.0 - 52.0 % Final   MCV 12/04/2023 91.0  80.0 - 100.0 fL Final   MCH 12/04/2023 28.9  26.0 - 34.0 pg Final   MCHC 12/04/2023 31.8  30.0 - 36.0 g/dL Final   RDW 92/97/7974 14.4  11.5 - 15.5 % Final   Platelets 12/04/2023 184  150 - 400 K/uL Final   nRBC 12/04/2023 0.0  0.0 - 0.2 % Final   Sodium 12/04/2023 135  135 - 145 mmol/L Final   Potassium 12/04/2023 4.0  3.5 - 5.1 mmol/L Final   Chloride 12/04/2023 105  98 - 111 mmol/L Final   CO2 12/04/2023 21 (L)  22 - 32 mmol/L Final   Glucose, Bld 12/04/2023 114 (H)  70 - 99 mg/dL Final   BUN 92/97/7974 29 (H)  8 - 23 mg/dL Final   Creatinine, Ser 12/04/2023 1.86 (H)  0.61 - 1.24 mg/dL Final   Calcium  12/04/2023 8.3 (L)  8.9 - 10.3 mg/dL Final   GFR, Estimated 12/04/2023 37 (L)  >60 mL/min Final   Anion gap 12/04/2023 9  5 - 15 Final   Glucose-Capillary 12/03/2023 139 (H)  70 - 99 mg/dL Final   WBC 92/96/7974 9.2  4.0 - 10.5 K/uL Final   RBC 12/05/2023 3.16 (L)  4.22 - 5.81 MIL/uL Final   Hemoglobin 12/05/2023 9.0 (L)  13.0 - 17.0 g/dL Final   HCT 92/96/7974 28.7 (L)  39.0 - 52.0 % Final   MCV 12/05/2023 90.8  80.0 - 100.0 fL Final   MCH 12/05/2023 28.5  26.0 - 34.0 pg Final   MCHC 12/05/2023 31.4  30.0 - 36.0 g/dL Final   RDW 92/96/7974 14.3  11.5 - 15.5 % Final   Platelets 12/05/2023 195  150  - 400 K/uL Final   nRBC 12/05/2023 0.0  0.0 -  0.2 % Final   Sodium 12/05/2023 137  135 - 145 mmol/L Final   Potassium 12/05/2023 4.0  3.5 - 5.1 mmol/L Final   Chloride 12/05/2023 107  98 - 111 mmol/L Final   CO2 12/05/2023 22  22 - 32 mmol/L Final   Glucose, Bld 12/05/2023 97  70 - 99 mg/dL Final   BUN 92/96/7974 28 (H)  8 - 23 mg/dL Final   Creatinine, Ser 12/05/2023 1.89 (H)  0.61 - 1.24 mg/dL Final   Calcium  12/05/2023 8.5 (L)  8.9 - 10.3 mg/dL Final   GFR, Estimated 12/05/2023 37 (L)  >60 mL/min Final   Anion gap 12/05/2023 8  5 - 15 Final   Glucose-Capillary 12/05/2023 143 (H)  70 - 99 mg/dL Final   Comment 1 92/96/7974 QC Due   Final  Admission on 11/30/2023, Discharged on 11/30/2023  Component Date Value Ref Range Status   Color, Urine 11/30/2023 STRAW (A)  YELLOW Final   APPearance 11/30/2023 CLEAR  CLEAR Final   Specific Gravity, Urine 11/30/2023 1.006  1.005 - 1.030 Final   pH 11/30/2023 6.0  5.0 - 8.0 Final   Glucose, UA 11/30/2023 NEGATIVE  NEGATIVE mg/dL Final   Hgb urine dipstick 11/30/2023 NEGATIVE  NEGATIVE Final   Bilirubin Urine 11/30/2023 NEGATIVE  NEGATIVE Final   Ketones, ur 11/30/2023 NEGATIVE  NEGATIVE mg/dL Final   Protein, ur 93/71/7974 NEGATIVE  NEGATIVE mg/dL Final   Nitrite 93/71/7974 NEGATIVE  NEGATIVE Final   Leukocytes,Ua 11/30/2023 LARGE (A)  NEGATIVE Final   RBC / HPF 11/30/2023 0-5  0 - 5 RBC/hpf Final   WBC, UA 11/30/2023 21-50  0 - 5 WBC/hpf Final   Bacteria, UA 11/30/2023 NONE SEEN  NONE SEEN Final   Squamous Epithelial / HPF 11/30/2023 0-5  0 - 5 /HPF Final   WBC 11/30/2023 5.8  4.0 - 10.5 K/uL Final   RBC 11/30/2023 3.33 (L)  4.22 - 5.81 MIL/uL Final   Hemoglobin 11/30/2023 9.6 (L)  13.0 - 17.0 g/dL Final   HCT 93/71/7974 32.7 (L)  39.0 - 52.0 % Final   MCV 11/30/2023 98.2  80.0 - 100.0 fL Final   MCH 11/30/2023 28.8  26.0 - 34.0 pg Final   MCHC 11/30/2023 29.4 (L)  30.0 - 36.0 g/dL Final   RDW 93/71/7974 14.5  11.5 - 15.5 % Final    Platelets 11/30/2023 177  150 - 400 K/uL Final   nRBC 11/30/2023 0.0  0.0 - 0.2 % Final   Neutrophils Relative % 11/30/2023 51  % Final   Neutro Abs 11/30/2023 3.0  1.7 - 7.7 K/uL Final   Lymphocytes Relative 11/30/2023 34  % Final   Lymphs Abs 11/30/2023 2.0  0.7 - 4.0 K/uL Final   Monocytes Relative 11/30/2023 9  % Final   Monocytes Absolute 11/30/2023 0.5  0.1 - 1.0 K/uL Final   Eosinophils Relative 11/30/2023 3  % Final   Eosinophils Absolute 11/30/2023 0.2  0.0 - 0.5 K/uL Final   Basophils Relative 11/30/2023 2  % Final   Basophils Absolute 11/30/2023 0.1  0.0 - 0.1 K/uL Final   Immature Granulocytes 11/30/2023 1  % Final   Abs Immature Granulocytes 11/30/2023 0.03  0.00 - 0.07 K/uL Final   Sodium 11/30/2023 139  135 - 145 mmol/L Final   Potassium 11/30/2023 5.0  3.5 - 5.1 mmol/L Final   Chloride 11/30/2023 106  98 - 111 mmol/L Final   CO2 11/30/2023 25  22 - 32 mmol/L Final   Glucose,  Bld 11/30/2023 81  70 - 99 mg/dL Final   BUN 93/71/7974 31 (H)  8 - 23 mg/dL Final   Creatinine, Ser 11/30/2023 1.78 (H)  0.61 - 1.24 mg/dL Final   Calcium  11/30/2023 8.9  8.9 - 10.3 mg/dL Final   Total Protein 93/71/7974 7.2  6.5 - 8.1 g/dL Final   Albumin  11/30/2023 3.6  3.5 - 5.0 g/dL Final   AST 93/71/7974 11 (L)  15 - 41 U/L Final   ALT 11/30/2023 5  0 - 44 U/L Final   Alkaline Phosphatase 11/30/2023 80  38 - 126 U/L Final   Total Bilirubin 11/30/2023 <0.2  0.0 - 1.2 mg/dL Final   GFR, Estimated 11/30/2023 39 (L)  >60 mL/min Final   Anion gap 11/30/2023 8  5 - 15 Final  Lab on 11/27/2023  Component Date Value Ref Range Status   Sodium 11/27/2023 141  135 - 145 mEq/L Final   Potassium 11/27/2023 4.8  3.5 - 5.1 mEq/L Final   Chloride 11/27/2023 104  96 - 112 mEq/L Final   CO2 11/27/2023 29  19 - 32 mEq/L Final   Glucose, Bld 11/27/2023 105 (H)  70 - 99 mg/dL Final   BUN 93/74/7974 33 (H)  6 - 23 mg/dL Final   Creatinine, Ser 11/27/2023 1.89 (H)  0.40 - 1.50 mg/dL Final   GFR 93/74/7974  34.42 (L)  >60.00 mL/min Final   Calcium  11/27/2023 9.0  8.4 - 10.5 mg/dL Final   CYSTATIN C 93/74/7974 2.26 (H)  0.52 - 1.16 mg/L Final   eGFR 11/27/2023 25 (L)  >=60 mL/min/1.54m2 Final   Creatinine, Urine 11/27/2023 35  20 - 320 mg/dL Final   Protein/Creat Ratio 11/27/2023 314 (H)  25 - 148 mg/g creat Final   Protein/Creatinine Ratio 11/27/2023 0.314 (H)  0.025 - 0.148 mg/mg creat Final   Total Protein, Urine 11/27/2023 11  5 - 25 mg/dL Final  Office Visit on 11/20/2023  Component Date Value Ref Range Status   CYSTATIN C 11/20/2023 2.28 (H)  0.52 - 1.16 mg/L Final   eGFR 11/20/2023 25 (L)  >=60 mL/min/1.4m2 Final   WBC 11/20/2023 5.1  4.0 - 10.5 K/uL Final   RBC 11/20/2023 3.39 (L)  4.22 - 5.81 Mil/uL Final   Hemoglobin 11/20/2023 9.7 (L)  13.0 - 17.0 g/dL Final   HCT 93/81/7974 29.8 (L)  39.0 - 52.0 % Final   MCV 11/20/2023 87.9  78.0 - 100.0 fl Final   MCHC 11/20/2023 32.4  30.0 - 36.0 g/dL Final   RDW 93/81/7974 15.1  11.5 - 15.5 % Final   Platelets 11/20/2023 168.0  150.0 - 400.0 K/uL Final   Neutrophils Relative % 11/20/2023 57.0  43.0 - 77.0 % Final   Lymphocytes Relative 11/20/2023 31.3  12.0 - 46.0 % Final   Monocytes Relative 11/20/2023 7.4  3.0 - 12.0 % Final   Eosinophils Relative 11/20/2023 2.8  0.0 - 5.0 % Final   Basophils Relative 11/20/2023 1.5  0.0 - 3.0 % Final   Neutro Abs 11/20/2023 2.9  1.4 - 7.7 K/uL Final   Lymphs Abs 11/20/2023 1.6  0.7 - 4.0 K/uL Final   Monocytes Absolute 11/20/2023 0.4  0.1 - 1.0 K/uL Final   Eosinophils Absolute 11/20/2023 0.1  0.0 - 0.7 K/uL Final   Basophils Absolute 11/20/2023 0.1  0.0 - 0.1 K/uL Final   Creatinine, Urine 11/20/2023 37  20 - 320 mg/dL Final   Protein/Creat Ratio 11/20/2023 162 (H)  25 - 148 mg/g creat  Final   Protein/Creatinine Ratio 11/20/2023 0.162 (H)  0.025 - 0.148 mg/mg creat Final   Total Protein, Urine 11/20/2023 6  5 - 25 mg/dL Final   MICRO NUMBER: 93/81/7974 [id number]   Final   SPECIMEN QUALITY:  11/20/2023 Adequate   Final   Sample Source 11/20/2023 URINE   Final   STATUS: 11/20/2023 FINAL   Final   Result: 11/20/2023 No Growth   Final   Fungus (Mycology) Culture 11/20/2023 Final report (A)   Final   Fungal result 1 11/20/2023 Candida albicans (A)   Final   Color, Urine 11/20/2023 YELLOW  YELLOW Final   APPearance 11/20/2023 CLEAR  CLEAR Final   Specific Gravity, Urine 11/20/2023 1.009  1.001 - 1.035 Final   pH 11/20/2023 6.5  5.0 - 8.0 Final   Glucose, UA 11/20/2023 NEGATIVE  NEGATIVE Final   Bilirubin Urine 11/20/2023 NEGATIVE  NEGATIVE Final   Ketones, ur 11/20/2023 NEGATIVE  NEGATIVE Final   Hgb urine dipstick 11/20/2023 NEGATIVE  NEGATIVE Final   Protein, ur 11/20/2023 NEGATIVE  NEGATIVE Final   Nitrite 11/20/2023 NEGATIVE  NEGATIVE Final   Leukocytes,Ua 11/20/2023 3+ (A)  NEGATIVE Final   WBC, UA 11/20/2023 > OR = 60 (A)  0 - 5 /HPF Final   RBC / HPF 11/20/2023 NONE SEEN  0 - 2 /HPF Final   Squamous Epithelial / HPF 11/20/2023 0-5  < OR = 5 /HPF Final   Bacteria, UA 11/20/2023 FEW (A)  NONE SEEN /HPF Final   Hyaline Cast 11/20/2023 NONE SEEN  NONE SEEN /LPF Final   Organism ID, Yeast 11/20/2023 Candida albicans   Final   Amphotericin B MIC 11/20/2023 0.5 ug/mL   Final   Anidulafungin MIC 11/20/2023 Comment   Final   Caspofungin MIC 11/20/2023 Comment   Final   Fluconazole  Islt MIC 11/20/2023 0.5 ug/mL Susceptible   Final   ISAVUCONAZOLE MIC 11/20/2023 0.008 ug/mL or less   Final   Itraconazole MIC 11/20/2023 0.12 ug/mL   Final   Micafungin  MIC 11/20/2023 0.015 ug/mL   Final   Posaconazole MIC 11/20/2023 0.06 ug/mL   Final   REZAFUNGIN MIC 11/20/2023 Comment   Final   Voriconazole MIC 11/20/2023 0.015 ug/mL   Final   specimen status report 11/20/2023 Comment   Final  Lab on 11/01/2023  Component Date Value Ref Range Status   Sodium 11/01/2023 141  135 - 145 mEq/L Final   Potassium 11/01/2023 5.0  3.5 - 5.1 mEq/L Final   Chloride 11/01/2023 106  96 - 112 mEq/L Final    CO2 11/01/2023 26  19 - 32 mEq/L Final   Glucose, Bld 11/01/2023 119 (H)  70 - 99 mg/dL Final   BUN 94/69/7974 26 (H)  6 - 23 mg/dL Final   Creatinine, Ser 11/01/2023 1.60 (H)  0.40 - 1.50 mg/dL Final   GFR 94/69/7974 42.05 (L)  >60.00 mL/min Final   Calcium  11/01/2023 8.7  8.4 - 10.5 mg/dL Final  Lab on 94/76/7974  Component Date Value Ref Range Status   Glucose, Bld 10/25/2023 104 (H)  65 - 99 mg/dL Final   BUN 94/76/7974 22  7 - 25 mg/dL Final   Creat 94/76/7974 1.52 (H)  0.70 - 1.28 mg/dL Final   eGFR 94/76/7974 48 (L)  > OR = 60 mL/min/1.41m2 Final   BUN/Creatinine Ratio 10/25/2023 14  6 - 22 (calc) Final   Sodium 10/25/2023 141  135 - 146 mmol/L Final   Potassium 10/25/2023 4.8  3.5 - 5.3 mmol/L Final  Chloride 10/25/2023 107  98 - 110 mmol/L Final   CO2 10/25/2023 27  20 - 32 mmol/L Final   Calcium  10/25/2023 8.7  8.6 - 10.3 mg/dL Final  Office Visit on 10/18/2023  Component Date Value Ref Range Status   Fungus (Mycology) Culture 10/18/2023 Final report   Final   Fungal result 1 10/18/2023 Comment   Final   Sodium 10/18/2023 141  135 - 145 mEq/L Final   Potassium 10/18/2023 5.1  3.5 - 5.1 mEq/L Final   Chloride 10/18/2023 105  96 - 112 mEq/L Final   CO2 10/18/2023 30  19 - 32 mEq/L Final   Glucose, Bld 10/18/2023 99  70 - 99 mg/dL Final   BUN 94/83/7974 29 (H)  6 - 23 mg/dL Final   Creatinine, Ser 10/18/2023 1.58 (H)  0.40 - 1.50 mg/dL Final   Total Bilirubin 10/18/2023 0.3  0.2 - 1.2 mg/dL Final   Alkaline Phosphatase 10/18/2023 85  39 - 117 U/L Final   AST 10/18/2023 11  0 - 37 U/L Final   ALT 10/18/2023 6  0 - 53 U/L Final   Total Protein 10/18/2023 7.4  6.0 - 8.3 g/dL Final   Albumin  10/18/2023 4.0  3.5 - 5.2 g/dL Final   GFR 94/83/7974 42.70 (L)  >60.00 mL/min Final   Calcium  10/18/2023 9.0  8.4 - 10.5 mg/dL Final   MICRO NUMBER: 94/83/7974 [id number]   Final   SPECIMEN QUALITY: 10/18/2023 Adequate   Final   Sample Source 10/18/2023 URINE   Final   STATUS:  10/18/2023 FINAL   Final   Result: 10/18/2023    Final                   Value:Mixed genital flora isolated. These superficial bacteria are not indicative of a urinary tract infection. No further organism identification is warranted on this specimen. If clinically indicated, recollect clean-catch, mid-stream urine and transfer  immediately to Urine Culture Transport Tube.    WBC 10/18/2023 5.9  4.0 - 10.5 K/uL Final   RBC 10/18/2023 3.38 (L)  4.22 - 5.81 Mil/uL Final   Hemoglobin 10/18/2023 9.7 (L)  13.0 - 17.0 g/dL Final   HCT 94/83/7974 29.8 (L)  39.0 - 52.0 % Final   MCV 10/18/2023 88.0  78.0 - 100.0 fl Final   MCHC 10/18/2023 32.7  30.0 - 36.0 g/dL Final   RDW 94/83/7974 16.2 (H)  11.5 - 15.5 % Final   Platelets 10/18/2023 177.0  150.0 - 400.0 K/uL Final   Neutrophils Relative % 10/18/2023 63.3  43.0 - 77.0 % Final   Lymphocytes Relative 10/18/2023 23.5  12.0 - 46.0 % Final   Monocytes Relative 10/18/2023 10.3  3.0 - 12.0 % Final   Eosinophils Relative 10/18/2023 2.2  0.0 - 5.0 % Final   Basophils Relative 10/18/2023 0.7  0.0 - 3.0 % Final   Neutro Abs 10/18/2023 3.8  1.4 - 7.7 K/uL Final   Lymphs Abs 10/18/2023 1.4  0.7 - 4.0 K/uL Final   Monocytes Absolute 10/18/2023 0.6  0.1 - 1.0 K/uL Final   Eosinophils Absolute 10/18/2023 0.1  0.0 - 0.7 K/uL Final   Basophils Absolute 10/18/2023 0.0  0.0 - 0.1 K/uL Final   Iron 10/18/2023 32 (L)  50 - 180 mcg/dL Final   TIBC 94/83/7974 238 (L)  250 - 425 mcg/dL (calc) Final   %SAT 94/83/7974 13 (L)  20 - 48 % (calc) Final   Ferritin 10/18/2023 420 (H)  24 - 380 ng/mL  Final   Color, Urine 10/18/2023 YELLOW  YELLOW Final   APPearance 10/18/2023 CLEAR  CLEAR Final   Specific Gravity, Urine 10/18/2023 1.009  1.001 - 1.035 Final   pH 10/18/2023 7.0  5.0 - 8.0 Final   Glucose, UA 10/18/2023 NEGATIVE  NEGATIVE Final   Bilirubin Urine 10/18/2023 NEGATIVE  NEGATIVE Final   Ketones, ur 10/18/2023 NEGATIVE  NEGATIVE Final   Hgb urine dipstick  10/18/2023 TRACE (A)  NEGATIVE Final   Protein, ur 10/18/2023 1+ (A)  NEGATIVE Final   Nitrite 10/18/2023 NEGATIVE  NEGATIVE Final   Leukocytes,Ua 10/18/2023 3+ (A)  NEGATIVE Final   WBC, UA 10/18/2023 40-60 (A)  0 - 5 /HPF Final   RBC / HPF 10/18/2023 NONE SEEN  0 - 2 /HPF Final   Squamous Epithelial / HPF 10/18/2023 NONE SEEN  < OR = 5 /HPF Final   Bacteria, UA 10/18/2023 NONE SEEN  NONE SEEN /HPF Final   Hyaline Cast 10/18/2023 0-5 (A)  NONE SEEN /LPF Final   Note 10/18/2023    Final  Lab on 09/24/2023  Component Date Value Ref Range Status   MICRO NUMBER: 09/24/2023 [id number]   Final   SPECIMEN QUALITY: 09/24/2023 Adequate   Final   Sample Source 09/24/2023 URINE   Final   STATUS: 09/24/2023 FINAL   Final   Result: 09/24/2023 No Growth   Final   Sodium 09/24/2023 139  135 - 145 mEq/L Final   Potassium 09/24/2023 4.9  3.5 - 5.1 mEq/L Final   Chloride 09/24/2023 103  96 - 112 mEq/L Final   CO2 09/24/2023 27  19 - 32 mEq/L Final   Glucose, Bld 09/24/2023 97  70 - 99 mg/dL Final   BUN 95/77/7974 23  6 - 23 mg/dL Final   Creatinine, Ser 09/24/2023 1.27  0.40 - 1.50 mg/dL Final   GFR 95/77/7974 55.53 (L)  >60.00 mL/min Final   Calcium  09/24/2023 8.9  8.4 - 10.5 mg/dL Final   INR 95/77/7974 1.0  0.8 - 1.0 ratio Final   Prothrombin Time 09/24/2023 11.1  9.6 - 13.1 sec Final   WBC 09/24/2023 6.6  4.0 - 10.5 K/uL Final   RBC 09/24/2023 3.61 (L)  4.22 - 5.81 Mil/uL Final   Hemoglobin 09/24/2023 10.1 (L)  13.0 - 17.0 g/dL Final   HCT 95/77/7974 31.7 (L)  39.0 - 52.0 % Final   MCV 09/24/2023 87.7  78.0 - 100.0 fl Final   MCHC 09/24/2023 31.8  30.0 - 36.0 g/dL Final   RDW 95/77/7974 17.3 (H)  11.5 - 15.5 % Final   Platelets 09/24/2023 207.0  150.0 - 400.0 K/uL Final   Neutrophils Relative % 09/24/2023 60.5  43.0 - 77.0 % Final   Lymphocytes Relative 09/24/2023 27.7  12.0 - 46.0 % Final   Monocytes Relative 09/24/2023 8.1  3.0 - 12.0 % Final   Eosinophils Relative 09/24/2023 2.4  0.0 - 5.0  % Final   Basophils Relative 09/24/2023 1.3  0.0 - 3.0 % Final   Neutro Abs 09/24/2023 4.0  1.4 - 7.7 K/uL Final   Lymphs Abs 09/24/2023 1.8  0.7 - 4.0 K/uL Final   Monocytes Absolute 09/24/2023 0.5  0.1 - 1.0 K/uL Final   Eosinophils Absolute 09/24/2023 0.2  0.0 - 0.7 K/uL Final   Basophils Absolute 09/24/2023 0.1  0.0 - 0.1 K/uL Final   MICRO NUMBER: 09/24/2023 [id number]   Final   SPECIMEN QUALITY: 09/24/2023 Suboptimal   Final   Source 09/24/2023 RT BASEL ARM  Final   STATUS: 09/24/2023 FINAL   Final   Result: 09/24/2023 No growth after 5 days Inspection of blood culture bottles indicates that an inadequate volume of blood may have been collected for the detection of sepsis.   Final   COMMENT: 09/24/2023 Aerobic and anaerobic bottle received.   Final   MICRO NUMBER: 09/24/2023 [id number]   Final   SPECIMEN QUALITY: 09/24/2023 Suboptimal   Final   Source 09/24/2023 LF ARM   Final   STATUS: 09/24/2023 FINAL   Final   Result: 09/24/2023 No growth after 5 days Inspection of blood culture bottles indicates that an inadequate volume of blood may have been collected for the detection of sepsis.   Final   COMMENT: 09/24/2023 Aerobic and anaerobic bottle received.   Final  There may be more visits with results that are not included.  No image results found. VAS US  LOWER EXTREMITY VENOUS (DVT) Result Date: 12/15/2023  Lower Venous DVT Study Patient Name:  FODAY [facility]E [facility]  Date of Exam:   12/14/2023 Medical Rec #: [id number]           Accession #:    [id number] Date of Birth: 09/09/1950           Patient Gender: M Patient Age:   17 years Exam Location:  [facility] Procedure:      VAS US  LOWER EXTREMITY VENOUS (DVT) Referring Phys: DONALDA APPLEBAUM --------------------------------------------------------------------------------  Indications: Swelling, SOB, and Covid.  Risk Factors: HFrEF, NICM, CHF, history of candidemia during prior admission 12/02/23-12/07/2023, readmission for fever, tachycardia  and new Ecoli and Covid infection. AKI, CKD IIIa. Limitations: Poor ultrasound/tissue interface and Patient sleeping with restless legs. Comparison Study: No prior study on file Performing Technologist: Alberta Lis RVS  Examination Guidelines: A complete evaluation includes B-mode imaging, spectral Doppler, color Doppler, and power Doppler as needed of all accessible portions of each vessel. Bilateral testing is considered an integral part of a complete examination. Limited examinations for reoccurring indications may be performed as noted. The reflux portion of the exam is performed with the patient in reverse Trendelenburg.  +---------+---------------+---------+-----------+----------+--------------+ RIGHT    CompressibilityPhasicitySpontaneityPropertiesThrombus Aging +---------+---------------+---------+-----------+----------+--------------+ CFV      Full           Yes      No                                  +---------+---------------+---------+-----------+----------+--------------+ SFJ      Full                                                        +---------+---------------+---------+-----------+----------+--------------+ FV Prox  Full           Yes      No                                  +---------+---------------+---------+-----------+----------+--------------+ FV Mid   Full           Yes      No                                  +---------+---------------+---------+-----------+----------+--------------+  FV DistalFull           Yes      No                                  +---------+---------------+---------+-----------+----------+--------------+ PFV      Full           Yes      No                                  +---------+---------------+---------+-----------+----------+--------------+ POP      Full           Yes      No                                  +---------+---------------+---------+-----------+----------+--------------+ PTV      Full                                                         +---------+---------------+---------+-----------+----------+--------------+ PERO     Full                                                        +---------+---------------+---------+-----------+----------+--------------+ Gastroc  Full                                                        +---------+---------------+---------+-----------+----------+--------------+ Large cystic structure containing mixed echoes noted in the popliteal fossa measuring 4.35 x 2.81 cm.  +---------+---------------+---------+-----------+----------+--------------+ LEFT     CompressibilityPhasicitySpontaneityPropertiesThrombus Aging +---------+---------------+---------+-----------+----------+--------------+ CFV      Full           Yes      No                                  +---------+---------------+---------+-----------+----------+--------------+ SFJ      Full                                                        +---------+---------------+---------+-----------+----------+--------------+ FV Prox  Full           Yes      No                                  +---------+---------------+---------+-----------+----------+--------------+ FV Mid   Full                                                        +---------+---------------+---------+-----------+----------+--------------+  FV DistalFull           Yes      No                                  +---------+---------------+---------+-----------+----------+--------------+ PFV      Full                                                        +---------+---------------+---------+-----------+----------+--------------+ POP      Full           Yes      No                                  +---------+---------------+---------+-----------+----------+--------------+ PTV      Full                                                         +---------+---------------+---------+-----------+----------+--------------+ PERO     Full                                                        +---------+---------------+---------+-----------+----------+--------------+     Summary: BILATERAL: - No obvious evidence of deep 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 - RIGHT: - Large cystic structure containing mixed echoes noted in the popliteal fossa measuring 4.35 x 2.81 cm.   *See table(s) above for measurements and observations. Electronically signed by Gaile New MD on 12/15/2023 at 2:05:48 PM.    Final    DG Chest Port 1 View Result Date: 12/14/2023 CLINICAL DATA:  Shortness of breath EXAM: PORTABLE CHEST 1 VIEW COMPARISON:  12/12/2023 FINDINGS: 0.6 cm right mid lung nodule no change from 2017, considered benign. Low lung volumes are present, causing crowding of the pulmonary vasculature. Lower thoracic spondylosis. New hazy density peripherally at the left lung base may represent superimposition of osseous shadows, although early pneumonia or atelectasis could have a similar appearance. This could be in the lingula or left lower lobe. Degenerative glenohumeral arthropathy, left greater than right. IMPRESSION: 1. New hazy density peripherally at the left lung base may represent superimposition of osseous shadows, although early pneumonia or atelectasis could have a similar appearance. This could be in the lingula or left lower lobe. 2. Low lung volumes are present, causing crowding of the pulmonary vasculature. 3. Lower thoracic spondylosis. 4. Degenerative glenohumeral arthropathy, left greater than right. Electronically Signed   By: Harmonee Tozer Salvage M.D.   On: 12/14/2023 10:37   ECHOCARDIOGRAM COMPLETE Result Date: 12/13/2023    ECHOCARDIOGRAM REPORT   Patient Name:   SUMNER KIRCHMAN [facility] Date of Exam: 12/13/2023 Medical Rec #:  [id number]          Height:       72.0 in Accession #:    [id number]         Weight:  238.1 lb  Date of Birth:  12/16/1950          BSA:          2.294 m Patient Age:    73 years           BP:           91/60 mmHg Patient Gender: M                  HR:           63 bpm. Exam Location:  Inpatient Procedure: 2D Echo, Color Doppler and Cardiac Doppler (Both Spectral and Color            Flow Doppler were utilized during procedure). Indications:    Dyspnea  History:        Patient has prior history of Echocardiogram examinations, most                 recent 05/23/2023. Risk Factors:HDL.  Sonographer:    Benard Stallion Referring Phys: [id number] DONALDA M GHIMIRE IMPRESSIONS  1. Left ventricular ejection fraction, by estimation, is 25 to 30%. The left ventricle has severely decreased function. The left ventricle demonstrates global hypokinesis. There is mild left ventricular hypertrophy. Left ventricular diastolic parameters  are indeterminate.  2. Right ventricular systolic function is normal. The right ventricular size is normal. There is mildly elevated pulmonary artery systolic pressure. The estimated right ventricular systolic pressure is 43.9 mmHg.  3. The mitral valve is normal in structure. Mild to moderate mitral valve regurgitation.  4. The aortic valve is tricuspid. Aortic valve regurgitation is not visualized. No aortic stenosis is present.  5. Aortic dilatation noted. There is dilatation of the ascending aorta, measuring 41 mm.  6. The inferior vena cava is dilated in size with <50% respiratory variability, suggesting right atrial pressure of 15 mmHg. FINDINGS  Left Ventricle: Left ventricular ejection fraction, by estimation, is 25 to 30%. The left ventricle has severely decreased function. The left ventricle demonstrates global hypokinesis. The left ventricular internal cavity size was normal in size. There is mild left ventricular hypertrophy. Left ventricular diastolic parameters are indeterminate. Right Ventricle: The right ventricular size is normal. No increase in right ventricular wall thickness.  Right ventricular systolic function is normal. There is mildly elevated pulmonary artery systolic pressure. The tricuspid regurgitant velocity is 2.69  m/s, and with an assumed right atrial pressure of 15 mmHg, the estimated right ventricular systolic pressure is 43.9 mmHg. Left Atrium: Left atrial size was normal in size. Right Atrium: Right atrial size was normal in size. Pericardium: There is no evidence of pericardial effusion. Mitral Valve: The mitral valve is normal in structure. Mild to moderate mitral valve regurgitation. Tricuspid Valve: The tricuspid valve is normal in structure. Tricuspid valve regurgitation is mild. Aortic Valve: The aortic valve is tricuspid. Aortic valve regurgitation is not visualized. No aortic stenosis is present. Aortic valve mean gradient measures 2.0 mmHg. Aortic valve peak gradient measures 3.6 mmHg. Aortic valve area, by VTI measures 3.67 cm. Pulmonic Valve: The pulmonic valve was not well visualized. Pulmonic valve regurgitation is trivial. Aorta: The aortic root is normal in size and structure and aortic dilatation noted. There is dilatation of the ascending aorta, measuring 41 mm. Venous: The inferior vena cava is dilated in size with less than 50% respiratory variability, suggesting right atrial pressure of 15 mmHg. IAS/Shunts: The interatrial septum was not well visualized.  LEFT VENTRICLE PLAX 2D LVIDd:  5.40 cm   Diastology LVIDs:         4.90 cm   LV e' medial:  5.33 cm/s LV PW:         1.10 cm   LV e' lateral: 7.40 cm/s LV IVS:        1.10 cm LVOT diam:     2.20 cm LV SV:         76 LV SV Index:   33 LVOT Area:     3.80 cm  RIGHT VENTRICLE RV Basal diam:  4.10 cm RV Mid diam:    3.90 cm RV S prime:     10.40 cm/s TAPSE (M-mode): 2.2 cm LEFT ATRIUM           Index        RIGHT ATRIUM           Index LA diam:      4.50 cm 1.96 cm/m   RA Area:     22.80 cm LA Vol (A4C): 59.7 ml 26.02 ml/m  RA Volume:   64.10 ml  27.94 ml/m  AORTIC VALVE AV Area (Vmax):     3.60 cm AV Area (Vmean):   3.41 cm AV Area (VTI):     3.67 cm AV Vmax:           94.50 cm/s AV Vmean:          65.900 cm/s AV VTI:            0.208 m AV Peak Grad:      3.6 mmHg AV Mean Grad:      2.0 mmHg LVOT Vmax:         89.60 cm/s LVOT Vmean:        59.200 cm/s LVOT VTI:          0.201 m LVOT/AV VTI ratio: 0.97  AORTA Ao Root diam: 3.50 cm Ao Asc diam:  4.10 cm TRICUSPID VALVE TR Peak grad:   28.9 mmHg TR Vmax:        269.00 cm/s  SHUNTS Systemic VTI:  0.20 m Systemic Diam: 2.20 cm Lonni Nanas MD Electronically signed by Lonni Nanas MD Signature Date/Time: 12/13/2023/11:00:03 AM    Final    DG Chest Port 1 View Result Date: 12/12/2023 CLINICAL DATA:  Shortness of breath. EXAM: PORTABLE CHEST 1 VIEW COMPARISON:  Radiographs 12/12/2023 and 12/11/2023.  CT 03/16/2008. FINDINGS: 1648 hours. There are lower lung volumes. The heart size and mediastinal contours are stable. Increased patchy opacities at both lung bases, likely atelectasis. No confluent airspace disease, definite edema, pneumothorax or significant pleural effusion. The bones appear unchanged. IMPRESSION: Lower lung volumes with increased patchy opacities at both lung bases, likely atelectasis. Electronically Signed   By: Elsie Perone M.D.   On: 12/12/2023 16:59   DG Chest Port 1V same Day Result Date: 12/12/2023 CLINICAL DATA:  Shortness of breath EXAM: PORTABLE CHEST 1 VIEW COMPARISON:  12/11/2023 FINDINGS: Single frontal view of the chest demonstrates borderline enlargement the cardiac silhouette. No airspace disease, effusion, or pneumothorax. No acute bony abnormalities. IMPRESSION: 1. Borderline enlargement of the cardiac silhouette. 2. No acute airspace disease. Electronically Signed   By: Ozell Daring M.D.   On: 12/12/2023 09:32   DG CHEST PORT 1 VIEW Result Date: 12/11/2023 CLINICAL DATA:  [id number] SOB (shortness of breath) [id number] EXAM: PORTABLE CHEST 1 VIEW COMPARISON:  AP radiograph the chest dated December 11, 2023.  FINDINGS: The heart is normal in size. Pulmonary vasculature is unremarkable. There  is a calcified granuloma in the right midlung zone. The lungs appear clear otherwise. IMPRESSION: No active disease. Electronically Signed   By: Evalene Coho M.D.   On: 12/11/2023 21:04   DG Chest Port 1 View Result Date: 12/11/2023 CLINICAL DATA:  Questionable sepsis - evaluate for abnormality. Tachypnea. EXAM: PORTABLE CHEST 1 VIEW COMPARISON:  07/12/2023. FINDINGS: Stable calcified granuloma overlying the right lower lung zone, laterally. Bilateral lung fields are clear. No acute consolidation or lung collapse. Bilateral costophrenic angles are clear. Stable cardio-mediastinal silhouette. No acute osseous abnormalities. The soft tissues are within normal limits. IMPRESSION: No active disease. Electronically Signed   By: Ree Molt M.D.   On: 12/11/2023 13:08   CT ABDOMEN PELVIS W CONTRAST Result Date: 12/02/2023 CLINICAL DATA:  Abdominal pain, hematuria. EXAM: CT ABDOMEN AND PELVIS WITH CONTRAST TECHNIQUE: Multidetector CT imaging of the abdomen and pelvis was performed using the standard protocol following bolus administration of intravenous contrast. RADIATION DOSE REDUCTION: This exam was performed according to the departmental dose-optimization program which includes automated exposure control, adjustment of the mA and/or kV according to patient size and/or use of iterative reconstruction technique. CONTRAST:  75mL OMNIPAQUE  IOHEXOL  350 MG/ML SOLN COMPARISON:  06/02/2023. FINDINGS: Lower chest: No acute findings. Heart is at the upper limits of normal in size with left ventricular dilatation. No pericardial or pleural effusion. Distal esophagus is grossly unremarkable. Hepatobiliary: Liver and gallbladder are unremarkable. No biliary ductal dilatation. Pancreas: Negative. Spleen: Negative. Adrenals/Urinary Tract: Scarring in the right kidney. Low-attenuation lesions in the kidneys. No specific follow-up  necessary. Left renal edema with mild left hydronephrosis which appears to be secondary to marked bladder wall thickening. Right ureter is decompressed. Foley catheter and air are seen in the bladder. There is haziness and fluid surrounding the bladder. Stomach/Bowel: Stomach, small bowel, appendix and majority of the colon are unremarkable. There appears to be a low-attenuation tract extending from the rectosigmoid colon to the posterior margin of the bladder (3/67-70). Vascular/Lymphatic: Atherosclerotic calcification of the aorta. No pathologically enlarged lymph nodes. Reproductive: Prostate is visualized. Other: No free fluid.  Mesenteries and peritoneum are unremarkable. Musculoskeletal: Degenerative changes in the spine. IMPRESSION: 1. Marked bladder wall thickening with surrounding edema and fluid, indicative of severe cystitis. Associated mild left hydronephrosis. 2. Suspected tract from the sigmoid colon to the posterior bladder wall, worrisome for a chronic colovesical fistula. 3.  Aortic atherosclerosis (ICD10-I70.0). Electronically Signed   By: Newell Eke M.D.   On: 12/02/2023 09:00         ASSESSMENT & PLAN   Assessment & Plan Complication of Foley catheter, subsequent encounter Stricture of male urethra, unspecified stricture type A Foley catheter is in place due to difficulty urinating and to prevent further fungal growth. Catheter insertion caused an injury, leading to blood and tissue in the urine. The catheter is necessary to keep the bladder drained and prevent the fistula from worsening. The injury is expected to heal with the catheter in place, preventing blockage formation. Removing the catheter prematurely could lead to sealing of the injury and inability to urinate. Maintain the Foley catheter until the injury heals and blood clears from urine. Monitor for signs of infection and ensure healing. 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 He is currently on amoxicillin  to prevent bacterial infection due to the Foley catheter. Continue amoxicillin  and check urine for signs of bladder infection.  Previously hospitalized for asymptomatic candidemia and treated with caspofungin, with successful results and no current evidence of fungal infection in the eyes. He is currently on fluconazole  to prevent recurrence until the upcoming surgery. Continue fluconazole  until surgery. 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 Surgery is planned to repair the fistula, which should resolve infections and eliminate the need for a Foley catheter. The thin-walled nature of the bladder and rectum makes him prone to injury and fistula formation. Surgery aims to prevent future hospitalizations and infections. Proceed with the scheduled surgery and perform a preliminary exam at the hospital next Thursday. Post-surgery, monitor bladder function. Polypharmacy He is on multiple medications, leading to potential adverse effects and interactions. Medications were reviewed to reduce unnecessary drugs and manage side effects. He was educated on the importance of reducing medication load to improve overall health and reduce side effects. Potential withdrawal syndromes from stopping medications were discussed, emphasizing careful management. Discontinue unnecessary medications as discussed, continue essential medications, including those for hypertension and infection prevention, and switch folic acid  to a multivitamin. Chronic kidney disease, stage 3a (HCC)   Follow-up   He is scheduled for a preliminary exam and subsequent surgery to address the bladder-rectal fistula. Follow-up is necessary to monitor recovery and ensure resolution of infections. Perform blood and urine  tests to monitor for infection and healing. Follow up after surgery to assess recovery and bladder function.   ORDER ASSOCIATIONS  #   DIAGNOSIS / CONDITION ICD-10 ENCOUNTER ORDER     ICD-10-CM   1. Recurrent UTI  N39.0 Comprehensive metabolic panel with GFR    CBC with Differential/Platelet    Urinalysis w microscopic + reflex cultur    Urinalysis w microscopic + reflex cultur    2. Complication of Foley catheter, subsequent encounter  T83.9XXD     3. Stricture of male urethra, unspecified stricture type  N35.919     4. Colovesical fistula  N32.1     5. Polypharmacy  Z79.899     6. Chronic kidney disease, stage 3a (HCC)  N18.31       Orders Placed This Encounter  Procedures   Urine Culture   Comprehensive metabolic panel with GFR    Has the patient fasted?:   No    Release to patient:   Immediate [1]   CBC with Differential/Platelet    Release to patient:   Immediate [1]   Urinalysis w microscopic + reflex cultur   Urinalysis w microscopic + reflex cultur   REFLEXIVE URINE CULTURE      This document was synthesized by artificial intelligence ([employer]bridge) using HIPAA-compliant recording of the clinical interaction;   We discussed the use of AI scribe software for clinical note transcription with the patient, who gave verbal consent to proceed. additional Info: This encounter employed state-of-the-art, real-time, collaborative documentation. The patient actively reviewed and assisted in updating their electronic medical record on a shared screen, ensuring transparency and facilitating joint problem-solving for the problem list, overview, and plan. This approach promotes accurate, informed care. The treatment plan was discussed and reviewed in detail, including medication safety, potential side effects, and all patient questions. We confirmed understanding and comfort with the plan. Follow-up instructions were established, including contacting the office for any concerns, returning if  symptoms worsen, persist, or new symptoms develop, and precautions for potential emergency department visits.

## 2023-12-28 ENCOUNTER — Ambulatory Visit: Payer: Self-pay | Admitting: Internal Medicine

## 2023-12-28 ENCOUNTER — Encounter: Payer: Self-pay | Admitting: Internal Medicine

## 2023-12-28 DIAGNOSIS — N35919 Unspecified urethral stricture, male, unspecified site: Secondary | ICD-10-CM

## 2023-12-28 HISTORY — DX: Unspecified urethral stricture, male, unspecified site: N35.919

## 2023-12-28 NOTE — Assessment & Plan Note (Signed)
 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 He is currently on amoxicillin  to prevent bacterial infection due to the Foley catheter. Continue amoxicillin  and check urine for signs of bladder infection.  Previously hospitalized for asymptomatic candidemia and treated with caspofungin, with successful results and no current evidence of fungal infection in the eyes. He is currently on fluconazole  to prevent recurrence until the upcoming surgery. Continue fluconazole  until surgery.

## 2023-12-28 NOTE — Patient Instructions (Signed)
 VISIT SUMMARY:  You had a follow-up appointment today to discuss your recent hospitalizations for a fungal infection and COVID-19. We reviewed your current health status, medications, and plans for upcoming surgery.  YOUR PLAN:  -BLADDER-RECTAL FISTULA: A fistula is an abnormal connection between the bladder and rectum, causing recurrent infections. Surgery is planned to repair this fistula, which should resolve the infections and eliminate the need for a Foley catheter. Please proceed with the scheduled surgery and attend the preliminary exam at the hospital next Thursday. After surgery, we will monitor your bladder function closely.  -CANDIDA URINARY TRACT INFECTION (UTI): You have a recurrent urinary tract infection caused by a fungal infection, likely due to the fistula. You are currently taking amoxicillin  to prevent bacterial infection from the Foley catheter. Please continue taking amoxicillin  and we will check your urine for signs of infection.  -FOLEY CATHETER COMPLICATION: The Foley catheter is in place to help you urinate and prevent further fungal growth. The insertion caused some injury, leading to blood in your urine. It is important to keep the catheter in place until the injury heals and the blood clears. We will monitor for signs of infection and ensure proper healing.  -ASYMPTOMATIC CANDIDEMIA: Candidemia is a fungal infection in the blood. You were previously treated successfully with caspofungin and are now on fluconazole  to prevent recurrence until your upcoming surgery. Please continue taking fluconazole  until the surgery.  -POLYPHARMACY: You are taking multiple medications, which can lead to side effects and interactions. We reviewed your medications and reduced unnecessary ones to improve your overall health. Please discontinue the unnecessary medications as discussed, continue the essential ones, and switch from folic acid  to a multivitamin.  INSTRUCTIONS:  Please attend  your preliminary exam at the hospital next Thursday and proceed with the scheduled surgery to repair the bladder-rectal fistula. Continue taking your medications as prescribed, and monitor for any signs of infection or complications. Follow up after surgery to assess your recovery and bladder function. We will also perform blood and urine tests to monitor for infection and healing.

## 2023-12-28 NOTE — Assessment & Plan Note (Signed)
 A Foley catheter is in place due to difficulty urinating and to prevent further fungal growth. Catheter insertion caused an injury, leading to blood and tissue in the urine. The catheter is necessary to keep the bladder drained and prevent the fistula from worsening. The injury is expected to heal with the catheter in place, preventing blockage formation. Removing the catheter prematurely could lead to sealing of the injury and inability to urinate. Maintain the Foley catheter until the injury heals and blood clears from urine. Monitor for signs of infection and ensure healing.

## 2023-12-28 NOTE — Progress Notes (Signed)
 Clinical Note Addendum - Urinalysis Abnormalities (12/27/2023) Assessment: Patient is a 73 year old male with history of stage 3a CKD, chronic indwelling Foley catheter, recurrent complicated UTI (including candiduria and E. coli bacteremia), colovesical fistula, hydronephrosis, and BPH with outlet obstruction. Most recent urinalysis demonstrates persistent hematuria (3+ blood, packed RBCs), proteinuria (3+), pyuria (10-20 WBC/hpf), 1+ leukocyte esterase, and few bacteria, but is otherwise without nitrites. Patient is currently afebrile, without leukocytosis, and demonstrates stable renal function. He is currently on amoxicillin  and awaiting urine culture results. Medical Decision-Making: In the setting of chronic Foley catheterization, colovesical fistula, and ongoing urologic pathology, abnormal urinalysis findings (hematuria, pyuria, proteinuria, and bacteriuria) are expected and may not represent active infection, but rather chronic inflammation and colonization. Clinical correlation is essential. At this time, the patient demonstrates no systemic signs of infection (no fever, hemodynamic instability, or altered mental status). Therefore, no change in antimicrobial therapy is indicated unless urine culture demonstrates a resistant or new organism, or if the patient develops new symptoms. Catheter care is being maintained per protocol. Renal function and hemoglobin will be trended regularly. Urology and nephrology are involved for ongoing management of structural and functional urinary tract abnormalities. The patient and family have been educated regarding signs and symptoms that should prompt immediate medical attention, including fever, rigors, worsening confusion, visible blood in urine, inability to void, or new pain. Statement: Given the patient's complex urologic history and chronic Foley catheterization, abnormal urinalysis findings alone do not mandate escalation of antibiotics or further  intervention in the absence of clinical evidence of infection or decompensation. This approach is consistent with current guidelines (CDC, IDSA) and best practices for antimicrobial stewardship, and is intended to avoid unnecessary antibiotic exposure and its associated risks. The patient will be closely monitored and management will be adjusted if clinical status changes or if urine culture results dictate.

## 2023-12-28 NOTE — Assessment & Plan Note (Signed)
 A fistula between the bladder and rectum, likely due to inflammation and ulceration, is causing recurrent infections and candidemia. Surgery is planned to repair the fistula, which should resolve infections and eliminate the need for a Foley catheter. The thin-walled nature of the bladder and rectum makes him prone to injury and fistula formation. Surgery aims to prevent future hospitalizations and infections. Proceed with the scheduled surgery and perform a preliminary exam at the hospital next Thursday. Post-surgery, monitor bladder function.

## 2023-12-30 ENCOUNTER — Telehealth: Payer: Self-pay

## 2023-12-30 DIAGNOSIS — N1831 Chronic kidney disease, stage 3a: Secondary | ICD-10-CM | POA: Diagnosis not present

## 2023-12-30 DIAGNOSIS — N136 Pyonephrosis: Secondary | ICD-10-CM | POA: Diagnosis not present

## 2023-12-30 DIAGNOSIS — R6520 Severe sepsis without septic shock: Secondary | ICD-10-CM | POA: Diagnosis not present

## 2023-12-30 DIAGNOSIS — A4151 Sepsis due to Escherichia coli [E. coli]: Secondary | ICD-10-CM | POA: Diagnosis not present

## 2023-12-30 DIAGNOSIS — R0603 Acute respiratory distress: Secondary | ICD-10-CM | POA: Diagnosis not present

## 2023-12-30 DIAGNOSIS — B377 Candidal sepsis: Secondary | ICD-10-CM | POA: Diagnosis not present

## 2023-12-30 DIAGNOSIS — D631 Anemia in chronic kidney disease: Secondary | ICD-10-CM | POA: Diagnosis not present

## 2023-12-30 DIAGNOSIS — I5043 Acute on chronic combined systolic (congestive) and diastolic (congestive) heart failure: Secondary | ICD-10-CM | POA: Diagnosis not present

## 2023-12-30 DIAGNOSIS — U071 COVID-19: Secondary | ICD-10-CM | POA: Diagnosis not present

## 2023-12-30 LAB — URINALYSIS W MICROSCOPIC + REFLEX CULTURE
Bilirubin Urine: NEGATIVE
Glucose, UA: NEGATIVE
Ketones, ur: NEGATIVE
Nitrites, Initial: NEGATIVE
Specific Gravity, Urine: 1.012 (ref 1.001–1.035)
Squamous Epithelial / HPF: NONE SEEN /HPF (ref <=5)
pH: 7.5 (ref 5.0–8.0)

## 2023-12-30 LAB — CULTURE INDICATED

## 2023-12-30 LAB — URINE CULTURE
MICRO NUMBER:: 16750818
SPECIMEN QUALITY:: ADEQUATE

## 2023-12-30 NOTE — Telephone Encounter (Signed)
 Copied from CRM [id number]. Topic: Clinical - Medication Question >> Dec 27, 2023  4:50 PM Deleta S wrote: Reason for CRM: patient would like to know which medications he will need to take after today's visit with pcp 7/25. She's also stating that she never received the paper that was discussed with pcp. Please give the patient a call at [phone]  Tried to call wife back  about medication no answer left message for her to return my call.

## 2023-12-30 NOTE — Telephone Encounter (Signed)
 Pt called back and stated he already took care of his medications and did not need to speak with anyone.

## 2023-12-31 ENCOUNTER — Other Ambulatory Visit: Payer: Self-pay | Admitting: *Deleted

## 2023-12-31 NOTE — Patient Instructions (Signed)
 SURGICAL WAITING ROOM VISITATION  Patients having surgery or a procedure may have no more than 2 support people in the waiting area - these visitors may rotate.    Children under the age of 67 must have an adult with them who is not the patient.  Visitors with respiratory illnesses are discouraged from visiting and should remain at home.  If the patient needs to stay at the hospital during part of their recovery, the visitor guidelines for inpatient rooms apply. Pre-op nurse will coordinate an appropriate time for 1 support person to accompany patient in pre-op.  This support person may not rotate.    Please refer to the [facility] website for the visitor guidelines for Inpatients (after your surgery is over and you are in a regular room).   Your procedure is scheduled on: 01/09/24   Report to [facility] Main Entrance    Report to admitting at 6:15 AM   Call this number if you have problems the morning of surgery [phone]   Follow a clear liquid diet the day before surgery.   You may have the following liquids until 5:30 AM DAY OF SURGERY  Water Non-Citrus Juices (without pulp, NO RED-Apple, White grape, White cranberry) Black Coffee (NO MILK/CREAM OR CREAMERS, sugar ok)  Clear Tea (NO MILK/CREAM OR CREAMERS, sugar ok) regular and decaf                             Plain Jell-O (NO RED)                                           Fruit ices (not with fruit pulp, NO RED)                                     Popsicles (NO RED)                                                               Sports drinks like Gatorade (NO RED)              Drink 2 Ensure/G2 drinks AT 10:00 PM the night before surgery.        The day of surgery:  Drink ONE (1) Pre-Surgery Clear Ensure or G2 at 5:30 AM the morning of surgery. Drink in one sitting. Do not sip.  This drink was given to you during your hospital  pre-op appointment visit. Nothing else to drink after completing the  Pre-Surgery  Clear Ensure or G2.          If you have questions, please contact your surgeon's office.   FOLLOW BOWEL PREP AND ANY ADDITIONAL PRE OP INSTRUCTIONS YOU RECEIVED FROM YOUR SURGEON'S OFFICE!!!     Oral Hygiene is also important to reduce your risk of infection.                                    Remember - BRUSH YOUR TEETH THE MORNING OF SURGERY  WITH YOUR REGULAR TOOTHPASTE  DENTURES WILL BE REMOVED PRIOR TO SURGERY PLEASE DO NOT APPLY Poly grip OR ADHESIVES!!!   Do NOT smoke after Midnight   Stop all vitamins and herbal supplements 7 days before surgery.   Take these medicines the morning of surgery with A SIP OF WATER: Abilify , Buspirone , Carvedilol , Gabapentin , Flomax , Venlafaxine , Primide   DO NOT TAKE ANY ORAL DIABETIC MEDICATIONS DAY OF YOUR SURGERY  Bring CPAP mask and tubing day of surgery.                              You may not have any metal on your body including jewelry, and body piercing             Do not wear lotions, powders, cologne, or deodorant              Men may shave face and neck.   Do not bring valuables to the hospital. [facility] IS NOT             RESPONSIBLE   FOR VALUABLES.   Contacts, glasses, dentures or bridgework may not be worn into surgery.   Bring small overnight bag day of surgery.   DO NOT BRING YOUR HOME MEDICATIONS TO THE HOSPITAL. PHARMACY WILL DISPENSE MEDICATIONS LISTED ON YOUR MEDICATION LIST TO YOU DURING YOUR ADMISSION IN THE HOSPITAL!   Special Instructions: Bring a copy of your healthcare power of attorney and living will documents the day of surgery if you haven't scanned them before.              Please read over the following fact sheets you were given: IF YOU HAVE QUESTIONS ABOUT YOUR PRE-OP INSTRUCTIONS PLEASE CALL [phone].   If you received a COVID test during your pre-op visit  it is requested that you wear a mask when out in public, stay away from anyone that may not be feeling well and notify your  surgeon if you develop symptoms. If you test positive for Covid or have been in contact with anyone that has tested positive in the last 10 days please notify you surgeon.    [place] - Preparing for Surgery Before surgery, you can play an important role.  Because skin is not sterile, your skin needs to be as free of germs as possible.  You can reduce the number of germs on your skin by washing with CHG (chlorahexidine gluconate) soap before surgery.  CHG is an antiseptic cleaner which kills germs and bonds with the skin to continue killing germs even after washing. Please DO NOT use if you have an allergy to CHG or antibacterial soaps.  If your skin becomes reddened/irritated stop using the CHG and inform your nurse when you arrive at Short Stay. Do not shave (including legs and underarms) for at least 48 hours prior to the first CHG shower.  You may shave your face/neck.  Please follow these instructions carefully:  1.  Shower with CHG Soap the night before surgery and the  morning of surgery.  2.  If you choose to wash your hair, wash your hair first as usual with your normal  shampoo.  3.  After you shampoo, rinse your hair and body thoroughly to remove the shampoo.                             4.  Use  CHG as you would any other liquid soap.  You can apply chg directly to the skin and wash.  Gently with a scrungie or clean washcloth.  5.  Apply the CHG Soap to your body ONLY FROM THE NECK DOWN.   Do   not use on face/ open                           Wound or open sores. Avoid contact with eyes, ears mouth and   genitals (private parts).                       Wash face,  Genitals (private parts) with your normal soap.             6.  Wash thoroughly, paying special attention to the area where your    surgery  will be performed.  7.  Thoroughly rinse your body with warm water from the neck down.  8.  DO NOT shower/wash with your normal soap after using and rinsing off the CHG Soap.                 9.  Pat yourself dry with a clean towel.            10.  Wear clean pajamas.            11.  Place clean sheets on your bed the night of your first shower and do not  sleep with pets. Day of Surgery : Do not apply any lotions/deodorants the morning of surgery.  Please wear clean clothes to the hospital/surgery center.  FAILURE TO FOLLOW THESE INSTRUCTIONS MAY RESULT IN THE CANCELLATION OF YOUR SURGERY  PATIENT SIGNATURE_________________________________  NURSE SIGNATURE__________________________________  ________________________________________________________________________  Nasario Exon  An incentive spirometer is a tool that can help keep your lungs clear and active. This tool measures how well you are filling your lungs with each breath. Taking long deep breaths may help reverse or decrease the chance of developing breathing (pulmonary) problems (especially infection) following: A long period of time when you are unable to move or be active. BEFORE THE PROCEDURE  If the spirometer includes an indicator to show your best effort, your nurse or respiratory therapist will set it to a desired goal. If possible, sit up straight or lean slightly forward. Try not to slouch. Hold the incentive spirometer in an upright position. INSTRUCTIONS FOR USE  Sit on the edge of your bed if possible, or sit up as far as you can in bed or on a chair. Hold the incentive spirometer in an upright position. Breathe out normally. Place the mouthpiece in your mouth and seal your lips tightly around it. Breathe in slowly and as deeply as possible, raising the piston or the ball toward the top of the column. Hold your breath for 3-5 seconds or for as long as possible. Allow the piston or ball to fall to the bottom of the column. Remove the mouthpiece from your mouth and breathe out normally. Rest for a few seconds and repeat Steps 1 through 7 at least 10 times every 1-2 hours when you are awake. Take  your time and take a few normal breaths between deep breaths. The spirometer may include an indicator to show your best effort. Use the indicator as a goal to work toward during each repetition. After each set of 10 deep breaths, practice coughing to be sure your lungs are clear.  If you have an incision (the cut made at the time of surgery), support your incision when coughing by placing a pillow or rolled up towels firmly against it. Once you are able to get out of bed, walk around indoors and cough well. You may stop using the incentive spirometer when instructed by your caregiver.  RISKS AND COMPLICATIONS Take your time so you do not get dizzy or light-headed. If you are in pain, you may need to take or ask for pain medication before doing incentive spirometry. It is harder to take a deep breath if you are having pain. AFTER USE Rest and breathe slowly and easily. It can be helpful to keep track of a log of your progress. Your caregiver can provide you with a simple table to help with this. If you are using the spirometer at home, follow these instructions: SEEK MEDICAL CARE IF:  You are having difficultly using the spirometer. You have trouble using the spirometer as often as instructed. Your pain medication is not giving enough relief while using the spirometer. You develop fever of 100.5 F (38.1 C) or higher. SEEK IMMEDIATE MEDICAL CARE IF:  You cough up bloody sputum that had not been present before. You develop fever of 102 F (38.9 C) or greater. You develop worsening pain at or near the incision site. MAKE SURE YOU:  Understand these instructions. Will watch your condition. Will get help right away if you are not doing well or get worse. Document Released: 10/01/2006 Document Revised: 08/13/2011 Document Reviewed: 12/02/2006 Exit[employer] Patient Information 2014 [employer], [place].   ________________________________________________________________________ WHAT IS A BLOOD  TRANSFUSION? Blood Transfusion Information  A transfusion is the replacement of blood or some of its parts. Blood is made up of multiple cells which provide different functions. Red blood cells carry oxygen and are used for blood loss replacement. White blood cells fight against infection. Platelets control bleeding. Plasma helps clot blood. Other blood products are available for specialized needs, such as hemophilia or other clotting disorders. BEFORE THE TRANSFUSION  Who gives blood for transfusions?  Healthy volunteers who are fully evaluated to make sure their blood is safe. This is blood bank blood. Transfusion therapy is the safest it has ever been in the practice of medicine. Before blood is taken from a donor, a complete history is taken to make sure that person has no history of diseases nor engages in risky social behavior (examples are intravenous drug use or sexual activity with multiple partners). The donor's travel history is screened to minimize risk of transmitting infections, such as malaria. The donated blood is tested for signs of infectious diseases, such as HIV and hepatitis. The blood is then tested to be sure it is compatible with you in order to minimize the chance of a transfusion reaction. If you or a relative donates blood, this is often done in anticipation of surgery and is not appropriate for emergency situations. It takes many days to process the donated blood. RISKS AND COMPLICATIONS Although transfusion therapy is very safe and saves many lives, the main dangers of transfusion include:  Getting an infectious disease. Developing a transfusion reaction. This is an allergic reaction to something in the blood you were given. Every precaution is taken to prevent this. The decision to have a blood transfusion has been considered carefully by your caregiver before blood is given. Blood is not given unless the benefits outweigh the risks. AFTER THE TRANSFUSION Right after  receiving a blood transfusion, you will usually feel  much better and more energetic. This is especially true if your red blood cells have gotten low (anemic). The transfusion raises the level of the red blood cells which carry oxygen, and this usually causes an energy increase. The nurse administering the transfusion will monitor you carefully for complications. HOME CARE INSTRUCTIONS  No special instructions are needed after a transfusion. You may find your energy is better. Speak with your caregiver about any limitations on activity for underlying diseases you may have. SEEK MEDICAL CARE IF:  Your condition is not improving after your transfusion. You develop redness or irritation at the intravenous (IV) site. SEEK IMMEDIATE MEDICAL CARE IF:  Any of the following symptoms occur over the next 12 hours: Shaking chills. You have a temperature by mouth above 102 F (38.9 C), not controlled by medicine. Chest, back, or muscle pain. People around you feel you are not acting correctly or are confused. Shortness of breath or difficulty breathing. Dizziness and fainting. You get a rash or develop hives. You have a decrease in urine output. Your urine turns a dark color or changes to pink, red, or brown. Any of the following symptoms occur over the next 10 days: You have a temperature by mouth above 102 F (38.9 C), not controlled by medicine. Shortness of breath. Weakness after normal activity. The white part of the eye turns yellow (jaundice). You have a decrease in the amount of urine or are urinating less often. Your urine turns a dark color or changes to pink, red, or brown. Document Released: 05/18/2000 Document Revised: 08/13/2011 Document Reviewed: 01/05/2008 [facility] Patient Information 2014 [place], [place].  _______________________________________________________________________

## 2023-12-31 NOTE — Progress Notes (Incomplete Revision)
 COVID Vaccine Completed: yes  Date of COVID positive in last 90 days:  PCP - Bernardino Cone, MD Cardiologist - Wilbert Bihari, MD  Chest x-ray - 12/14/23 Epic EKG - 12/11/23 Epic Stress Test - n/a ECHO - 12/13/23 Epic Cardiac Cath - 07/03/19 Epic Pacemaker/ICD device last checked: n/a Spinal Cord Stimulator:n/a  Bowel Prep - clears day before, Miralx, antibiotics, dulcolax,. Patient and wife unaware  Sleep Study - yes CPAP - no  Fasting Blood Sugar - n/a Checks Blood Sugar _____ times a day  Last dose of GLP1 agonist-  N/A GLP1 instructions:  Do not take after     Last dose of SGLT-2 inhibitors-  N/A SGLT-2 instructions:  Do not take after     Blood Thinner Instructions:  Last dose: n/a  Time: Aspirin  Instructions: Last Dose:  Activity level: Can go up a flight of stairs and perform activities of daily living without stopping and without symptoms of chest pain or shortness of breath. Difficulty with ambulation due to deconditioning and foley bag difficulty  Anesthesia review: DCM, HFrEF, HTN, LBBB, OSA, need cardiac clearance, COVID + 12/11/23 patient feeling totally recovered  Patient denies shortness of breath, fever, cough and chest pain at PAT appointment  Patient verbalized understanding of instructions that were given to them at the PAT appointment. Patient was also instructed that they will need to review over the PAT instructions again at home before surgery.

## 2023-12-31 NOTE — Patient Instructions (Signed)
 Visit Information  Thank you for taking time to visit with me today. Please don't hesitate to contact me if I can be of assistance to you before our next scheduled telephone appointment.  Our next appointment is by telephone on 01/07/24@ 1015 am  Following is a copy of your care plan:   Goals Addressed             This Visit's Progress    [facility] Transitions of Care (TOC) Care Plan       Problems:  New admission: 7/9 - 7/17 Calvin Escobar Primary Diagnosis: COVID - 19 & UTI Patient stated several times during initial call that he was hoping to just rest today - TOC RN was unable to complete full initial assessment - Patient did allow TOC RN to help with scheduling PCP hospital follow follow up and contact [facility] to discuss when they will do ROC  - PCP visit 12/26/13 and Home Health Tuesday 12/23/23 Patient states he will call and schedule other appointments stating,  I was in the hospital for almost 9 days and I just got home yesterday late. Today I just want to rest and get used to being at home.  Unable to review medications - patient states his wife is at work and she fills his pill box  12/24/23- spoke with patient who reports  feeling much better   has intermittent cough, expectorating small amounts of phlegm, no fever or signs of infection, home health nurse is working with pt and providing oversight with medications per pt, no issues reported with foley catheter 12/31/23- pt reports feeling better, getting stronger, continues working with home health, will have preadmission testing 7/30 in preparation for sigmoid colectomy on 01/09/24, no new concerns reported Unable to complete medication reconciliation today with patient (he is unable and spouse is at work, unable to speak with spouse)  Prior Admission Information:  Recent Hospitalization for treatment of Candidemia, pt reports no issues with foley catheter, states  urine is clear SDOH barrier: transportation Patient is able to drive  but has problems getting to specialist's appointments, pt is able to drive to primary care provider office, patient's spouse works full time and provides oversight for all medications, assists as needed, adult son lives with pt but does not assist  Goal:  Over the next 30 days, the patient will not experience hospital readmission  Interventions: Covid 19 & UTI Evaluation of current treatment plan related to Covid 19 & UTI, self-management and patient's adherence to plan as established by provider. Discussed plans with patient for ongoing care management follow up and provided patient with direct contact information for care management team Reviewed all upcoming scheduled appointments and surgery Reviewed signs/ symptoms of infection Reviewed care of foley catheter Verified with pt, home health is active Reinforced signs /symptoms of respiratory infection, reportable signs/ symptoms  Patient Self Care Activities:  Attend all scheduled provider appointments Attend church or other social activities Call pharmacy for medication refills 3-7 days in advance of running out of medications Call provider office for new concerns or questions  Notify RN Care Manager of TOC call rescheduling needs Participate in Transition of Care Program/Attend TOC scheduled calls Perform all self care activities independently  Take medications as prescribed   Continue follow up with Sheldon Standing, MD. Call. 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         Patient verbalizes understanding of instructions and care plan provided today and agrees to view in MyChart. Active MyChart status and patient understanding of how to access instructions and care plan via MyChart confirmed with patient.     Telephone follow up appointment with care management team member scheduled for: 01/07/24 @ 1015 am  Please call the care guide team at [phone] if you need to cancel or reschedule your appointment.   Please call the Suicide and Crisis Lifeline: 988 call the [place]  National Suicide Prevention Lifeline: (423)739-7644 or TTY: 680-270-2377 TTY 806-230-2175) to talk to a trained counselor call 1-800-273-TALK (toll free, 24 hour hotline) go to [facility] [address], [place] [phone]) call 911 if you are experiencing a Mental Health or Behavioral Health Crisis or need someone to talk to.  Mliss Creed [facility], BSN RN Care Manager/ Transition of Care [place]/ [facility] [phone]

## 2023-12-31 NOTE — Patient Outreach (Signed)
 Transition of Care week 3  Visit Note  12/31/2023  Name: Calvin Escobar MRN: [id number]          DOB: 07-24-1950  Situation: Patient enrolled in [facility] 30-day program. Visit completed with patient by telephone.   Background:    Past Medical History:  Diagnosis Date   Acute cystitis 05/13/2023   Acute hyponatremia 05/13/2023   Acute kidney injury superimposed on chronic kidney disease (HCC) 05/13/2023   CLINICAL CONTEXT: 73 year old male with stage 3 CKD, colovesical fistula, recurrent UTIs, and recent candidal UTI. Recent renal ultrasound (09/28/2023) confirmed mild left hydronephrosis and prostatic mass protruding into bladder base. Iron studies consistent with anemia of chronic disease (iron 32, TIBC 238, ferritin 420).   DIAGNOSTIC ASSESSMENT: ? 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 History of GI bleeding (07/2023). Currently on ferrous sulfate  supplementation.   DIAGNOSTIC ASSESSMENT: ? Primary etiology (70-80% probability): Anemia of chronic disease/inflammation ? 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  EF 35 to 40% on echo 04/2021   Dementia (HCC)    Depression    Diarrhea  05/13/2023   E coli bacteremia 05/14/2023   High anion gap metabolic acidosis 07/13/2023   History of Clostridioides difficile colitis 08/15/2023   Associated with colovesical fistula? Recurrent antibiotic(s) requirement     Hyperkalemia    Associated with losartan   Advised patient low potassium diet 08/2022             Lab Results      Component    Value    Date/Time           K    4.0    12/27/2023 01:20 PM           K    4.4    12/18/2023 05:07 AM           K    4.4    12/17/2023 06:01 AM           K    4.7    12/16/2023 06:15 AM           K    4.9    12/15/2023 08:21 AM           K    4.4    12/14/2023 06:00 AM           K    5.0    Hyperlipidemia    Leukocytosis 12/11/2023   Lab Results      Component    Value    Date/Time           WBC    6.2    12/27/2023 01:20 PM           WBC    11.6 (H)  12/18/2023 05:07 AM           WBC    9.6    12/17/2023 06:01 AM           WBC    9.4    12/16/2023 06:15 AM           WBC    12.7 (H)    12/15/2023 08:21 AM           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  Work-up with cardiology in January 2021.     catheterization    Assessment:     1. Normal coronaries  2. NICM with EF 25-30%  3. Normal hemodynamics  .     PUD (peptic ulcer disease) 01/12/2015   Formatting of this note might be different from the original.  Endoscopy esophageal stricture 2016     Septic shock from UTI 06/02/2023   Severe sepsis (HCC) 06/03/2023   SIRS (systemic inflammatory response syndrome) (HCC) 12/11/2023   SKIN CANCER, HX OF 05/10/2007   Facial left check and forehead and r shoulder  F/w derm   Syncope 05/13/2023   Thrombocytopenia (HCC) 06/02/2023   Lab Results      Component    Value    Date           PLT    301    07/19/2023           PLT    324    07/18/2023           PLT    268    07/16/2023           PLT    207    07/14/2023           PLT     228    07/13/2023             Lab Results      Component    Value    Date           ESRSEDRATE    60 (H)    05/08/2023     No results found for: CRP          Lab Results      Component    Value       Urinary catheter complication (HCC) 06/24/2023   Urinary dribbling 03/14/2022   Urinary incontinence 11/21/2022      Urinary Incontinence: They are wearing disposable diapers daily due to urinary incontinence, primarily nocturnal, and are currently on Tamsulosin  (Flomax ). We will refer them to Urology for further evaluation and potential treatment options and continue Tamsulosin  until they are seen by Urology.     Urinary tract infection without hematuria 06/02/2023   UTI (urinary tract infection) 07/01/2023    Assessment: Patient Reported Symptoms: Cognitive Cognitive Status: No symptoms reported, Able to follow simple commands, Alert and oriented to person, place, and time      Neurological Neurological Review of Symptoms: No symptoms reported    HEENT HEENT Symptoms Reported: No symptoms reported      Cardiovascular Cardiovascular Symptoms Reported: No symptoms reported Does patient have uncontrolled Hypertension?: No Cardiovascular Comment: HF- pt not interested in weighing, reviewed HF action plan  Respiratory Respiratory Symptoms Reported: No symptoms reported Additional Respiratory Details: reinforced signs/symptoms of infection Respiratory Self-Management Outcome: 4 (good)  Endocrine Endocrine Symptoms  Reported: No symptoms reported Is patient diabetic?: No    Gastrointestinal Gastrointestinal Symptoms Reported: No symptoms reported Gastrointestinal Comment: pt scheduled for sigmoid colectomy 01/09/24, preadmission testing scheduled for 01/01/24    Genitourinary Genitourinary Symptoms Reported: No symptoms reported Additional Genitourinary Details: no issues reported with foley catheter, patent, draining well, reviewed signs /symptoms of infection, UTI Genitourinary Management  Strategies: Catheter, indwelling Genitourinary Self-Management Outcome: 4 (good) Genitourinary Comment: pt continues working with home health RN  Integumentary Integumentary Symptoms Reported: No symptoms reported    Musculoskeletal Musculoskelatal Symptoms Reviewed: No symptoms reported        Psychosocial Psychosocial Symptoms Reported: No symptoms reported         There were no vitals filed for this visit.  Medications Reviewed Today   Medications were not reviewed in this encounter   UNABLE to complete medication reconciliation due to spouse provides all oversight for medications, unable to speak with spouse today.  Recommendation:   PCP Follow-up Preadmission testing 01/01/24 Surgery- 01/09/24  Follow Up Plan:   Telephone follow-up 01/07/24 @ 1015 am  Mliss Creed [facility], BSN RN Care Manager/ Transition of Care [place]/ [facility] [phone]

## 2023-12-31 NOTE — Progress Notes (Signed)
 Reviewed sensitive enterococcus to current antibiotic(s)

## 2023-12-31 NOTE — Progress Notes (Signed)
 WOC RN came to mark patient for potential colostomy. Patient and wife knew nothing about a potential colostomy. Educated that it is only a possibility and to call surgeon's office for more information.  Patient needs a med rec. Patient and wife not sure what meds he is currently on. Gave med rec phone number and instructed to call once home to go over medications. Patient and wife verbalized understanding. Patient has memory issues. He is A&O x4 but had a difficult time understanding the nature of his surgery. It took him a bit of explanation to understand situation. Per patient and his wife, he signs his own consent forms.  COVID Vaccine Completed: yes  Date of COVID positive in last 90 days:  PCP - Bernardino Cone, MD Cardiologist - Wilbert Bihari, MD  Chest x-ray - 12/14/23 Epic EKG - 12/11/23 Epic Stress Test - n/a ECHO - 12/13/23 Epic Cardiac Cath - 07/03/19 Epic Pacemaker/ICD device last checked: n/a Spinal Cord Stimulator:n/a  Bowel Prep - clears day before, Miralx, antibiotics, dulcolax, Patient and wife unaware. Called Dr. Rubie office and asked about prep instructions. Per triage RN, patient was given instructions at appointment back in April. Triage RN saw that someone spoke with them 01/01/24 reminding about prep. She stated that she will confirm they verbally spoke with the patient, if not she will call the patient.  Sleep Study - yes CPAP - no  Fasting Blood Sugar - n/a Checks Blood Sugar _____ times a day  Last dose of GLP1 agonist-  N/A GLP1 instructions:  Do not take after     Last dose of SGLT-2 inhibitors-  N/A SGLT-2 instructions:  Do not take after     Blood Thinner Instructions:  Last dose: n/a  Time: Aspirin  Instructions: Last Dose:  Activity level: Can go up a flight of stairs and perform activities of daily living without stopping and without symptoms of chest pain or shortness of breath. Difficulty with ambulation due to deconditioning and foley bag  difficulty  Anesthesia review: DCM, HFrEF, HTN, LBBB, OSA, need cardiac clearance, COVID + 12/11/23 patient feeling totally recovered, Hgb 9.4  Patient denies shortness of breath, fever, cough and chest pain at PAT appointment  Patient verbalized understanding of instructions that were given to them at the PAT appointment. Patient was also instructed that they will need to review over the PAT instructions again at home before surgery.

## 2024-01-01 ENCOUNTER — Other Ambulatory Visit: Payer: Self-pay

## 2024-01-01 ENCOUNTER — Encounter (HOSPITAL_COMMUNITY): Payer: Self-pay

## 2024-01-01 ENCOUNTER — Encounter (HOSPITAL_COMMUNITY)
Admission: RE | Admit: 2024-01-01 | Discharge: 2024-01-01 | Disposition: A | Discharge status: Home / Self Care | Source: Ambulatory Visit | Attending: Surgery | Admitting: Surgery

## 2024-01-01 VITALS — BP 120/74 | HR 97 | Temp 97.9°F | Resp 18 | Ht 72.0 in

## 2024-01-01 DIAGNOSIS — Z01818 Encounter for other preprocedural examination: Secondary | ICD-10-CM

## 2024-01-01 DIAGNOSIS — I5022 Chronic systolic (congestive) heart failure: Secondary | ICD-10-CM | POA: Insufficient documentation

## 2024-01-01 DIAGNOSIS — R739 Hyperglycemia, unspecified: Secondary | ICD-10-CM | POA: Diagnosis not present

## 2024-01-01 DIAGNOSIS — Z01812 Encounter for preprocedural laboratory examination: Secondary | ICD-10-CM | POA: Diagnosis not present

## 2024-01-01 HISTORY — DX: Essential (primary) hypertension: I10

## 2024-01-01 HISTORY — DX: Heart failure, unspecified: I50.9

## 2024-01-01 HISTORY — DX: Pneumonia, unspecified organism: J18.9

## 2024-01-01 LAB — CBC
HCT: 31.7 % — ABNORMAL LOW (ref 39.0–52.0)
Hemoglobin: 9.4 g/dL — ABNORMAL LOW (ref 13.0–17.0)
MCH: 27.3 pg (ref 26.0–34.0)
MCHC: 29.7 g/dL — ABNORMAL LOW (ref 30.0–36.0)
MCV: 92.2 fL (ref 80.0–100.0)
Platelets: 227 K/uL (ref 150–400)
RBC: 3.44 MIL/uL — ABNORMAL LOW (ref 4.22–5.81)
RDW: 14.2 % (ref 11.5–15.5)
WBC: 6.7 K/uL (ref 4.0–10.5)
nRBC: 0 % (ref 0.0–0.2)

## 2024-01-01 LAB — BASIC METABOLIC PANEL WITH GFR
Anion gap: 14 (ref 5–15)
BUN: 21 mg/dL (ref 8–23)
CO2: 23 mmol/L (ref 22–32)
Calcium: 9.1 mg/dL (ref 8.9–10.3)
Chloride: 102 mmol/L (ref 98–111)
Creatinine, Ser: 1.41 mg/dL — ABNORMAL HIGH (ref 0.61–1.24)
GFR, Estimated: 52 mL/min — ABNORMAL LOW (ref >60)
Glucose, Bld: 102 mg/dL — ABNORMAL HIGH (ref 70–99)
Potassium: 4.7 mmol/L (ref 3.5–5.1)
Sodium: 139 mmol/L (ref 135–145)

## 2024-01-01 LAB — HEMOGLOBIN A1C
Hgb A1c MFr Bld: 5 % (ref 4.8–5.6)
Mean Plasma Glucose: 96.8 mg/dL

## 2024-01-01 NOTE — Consult Note (Addendum)
 WOC Nurse requested for preoperative stoma site marking  Discussed surgical procedure and stoma creation with patient and family.   The patient and his caregiver were surprised by the news of a possible stoma. I spent more than 30 minutes at the consultation. Explained role of the WOC nurse team.  Provided the patient with educational booklet and provided samples of pouching options.  Answered patient and family questions.   Examined patient sitting, and standing in order to place the marking in the patient's visual field, away from any creases or abdominal contour issues and within the rectus muscle.  Attempted to mark below the patient's belt line. Pt has excess fat (pannus).  Marked for colostomy in the LLQ 9 cm to the left of the umbilicus and 6 cm above/below the umbilicus.  Marked for ileostomy in the RLQ 9 cm to the right of the umbilicus and 5.5 cm above/below the umbilicus.  Patient's abdomen cleansed with CHG wipes at site markings, allowed to air dry prior to marking. Covered mark with thin film transparent dressing to preserve mark until date of surgery.   WOC Nurse team will follow up with patient after surgery for continue ostomy care and teaching.   Thank-you,  Lela Holm BSN, CNS, RN, [employer], WOCN  (Phone [phone])

## 2024-01-02 DIAGNOSIS — R0603 Acute respiratory distress: Secondary | ICD-10-CM | POA: Diagnosis not present

## 2024-01-02 DIAGNOSIS — U071 COVID-19: Secondary | ICD-10-CM | POA: Diagnosis not present

## 2024-01-02 DIAGNOSIS — I5043 Acute on chronic combined systolic (congestive) and diastolic (congestive) heart failure: Secondary | ICD-10-CM | POA: Diagnosis not present

## 2024-01-02 DIAGNOSIS — R6520 Severe sepsis without septic shock: Secondary | ICD-10-CM | POA: Diagnosis not present

## 2024-01-02 DIAGNOSIS — B377 Candidal sepsis: Secondary | ICD-10-CM | POA: Diagnosis not present

## 2024-01-02 DIAGNOSIS — N1831 Chronic kidney disease, stage 3a: Secondary | ICD-10-CM | POA: Diagnosis not present

## 2024-01-02 DIAGNOSIS — D631 Anemia in chronic kidney disease: Secondary | ICD-10-CM | POA: Diagnosis not present

## 2024-01-02 DIAGNOSIS — N136 Pyonephrosis: Secondary | ICD-10-CM | POA: Diagnosis not present

## 2024-01-02 DIAGNOSIS — A4151 Sepsis due to Escherichia coli [E. coli]: Secondary | ICD-10-CM | POA: Diagnosis not present

## 2024-01-02 NOTE — Progress Notes (Signed)
 Hgb 9.4, results routed to DR. Sheldon

## 2024-01-03 ENCOUNTER — Inpatient Hospital Stay: Admitting: Family

## 2024-01-04 ENCOUNTER — Other Ambulatory Visit: Payer: Self-pay | Admitting: Internal Medicine

## 2024-01-04 DIAGNOSIS — F3131 Bipolar disorder, current episode depressed, mild: Secondary | ICD-10-CM

## 2024-01-06 NOTE — Anesthesia Preprocedure Evaluation (Signed)
 Anesthesia Evaluation  Patient identified by MRN, date of birth, ID band Patient awake    Reviewed: Allergy & Precautions, H&P , NPO status , Patient's Chart, lab work & pertinent test results  Airway Mallampati: II  TM Distance: >3 FB Neck ROM: Limited    Dental  (+) Edentulous Upper, Missing, Poor Dentition, Dental Advisory Given, Partial Lower   Pulmonary sleep apnea , pneumonia   Pulmonary exam normal breath sounds clear to auscultation       Cardiovascular hypertension, Pt. on medications and Pt. on home beta blockers pulmonary hypertension+CHF  + dysrhythmias Atrial Fibrillation  Rhythm:Regular Rate:Normal  Echo 12/2023  1. Left ventricular ejection fraction, by estimation, is 25 to 30%. The left ventricle has severely decreased function. The left ventricle demonstrates global hypokinesis. There is mild left ventricular hypertrophy. Left ventricular diastolic parameters   are indeterminate.   2. Right ventricular systolic function is normal. The right ventricular size is normal. There is mildly elevated pulmonary artery systolic pressure. The estimated right ventricular systolic pressure is 43.9 mmHg.   3. The mitral valve is normal in structure. Mild to moderate mitral valve regurgitation.   4. The aortic valve is tricuspid. Aortic valve regurgitation is not visualized. No aortic stenosis is present.   5. Aortic dilatation noted. There is dilatation of the ascending aorta, measuring 41 mm.   6. The inferior vena cava is dilated in size with <50% respiratory variability, suggesting right atrial pressure of 15 mmHg.     Neuro/Psych  PSYCHIATRIC DISORDERS Anxiety Depression Bipolar Disorder  Dementia negative neurological ROS     GI/Hepatic Neg liver ROS, PUD,GERD  ,,  Endo/Other    Class 3 obesity  Renal/GU Renal disease     Musculoskeletal  (+) Arthritis , Osteoarthritis,    Abdominal  (+) + obese  Peds   Hematology  (+) Blood dyscrasia, anemia   Anesthesia Other Findings   Reproductive/Obstetrics negative OB ROS                              Anesthesia Physical Anesthesia Plan  ASA: 4  Anesthesia Plan: General   Post-op Pain Management: Tylenol  PO (pre-op)* and Gabapentin  PO (pre-op)*   Induction: Intravenous  PONV Risk Score and Plan: 4 or greater and Treatment may vary due to age or medical condition, Ondansetron  and Dexamethasone   Airway Management Planned: Oral ETT  Additional Equipment: Arterial line, CVP and Ultrasound Guidance Line Placement  Intra-op Plan:   Post-operative Plan: Possible Post-op intubation/ventilation  Informed Consent: I have reviewed the patients History and Physical, chart, labs and discussed the procedure including the risks, benefits and alternatives for the proposed anesthesia with the patient or authorized representative who has indicated his/her understanding and acceptance.     Dental advisory given  Plan Discussed with: CRNA  Anesthesia Plan Comments: (/See PAT note 01/01/2024  Risks of anesthesia explained at length. This includes, but is not limited to, sore throat, damage to teeth, lips gums, tongue and vocal cords, nausea and vomiting, reactions to medications, stroke, heart attack, and death. We had a long and detailed discussion regarding his NICM and robotic surgery with positioning and intra-abdominal pressure changes. He expressed understanding. All patient questions were answered and the patient wishes to proceed.   Dr. Sheldon and I also discussed Mr. Delia perioperative risk due to his NICM. We discussed positioning changes and insufflation as being possibly difficult to manage.  )  Anesthesia Quick Evaluation

## 2024-01-06 NOTE — Progress Notes (Signed)
 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 never smoker with h/o HTN, OSA on BiPAP, CHF, CKD Stage III, colovesical fistula scheduled for above procedure 01/09/2024 with Dr. Standing Sheldon and Dr. Rush Watt.   Pt with chronic indwelling catheter in place, recurrent UTIs. He has had numerus admissions due to sepsis, due to colovesical fistula.   Last admission 7/9-7/17/2025 with severe sepsis, also found to be COVID positive. Did experience respiratory distress during admission.  Stable at discharge on room air. Follow up with PCP 12/27/23, no respiratory sx at this time. Stable at PAT visit with no shortness of breath. Hemoglobin as low as 7.2 during admission, improved and is now 9.4 at PAT visit.   Pt seen by cardiology during this most recent admission. He had last been seen in cardio clinic 10/17/23 for preoperative evaluation. 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  Cardiology commented on preoperative risk during admission, Patient denies chest pain. Although he has low activity levels, he had a cath in 2021 that showed normal coronary arteries. He does not need repeat ischemic evaluation prior to surgery. OK to cancel stress test that was previously ordered and proceed with surgery once optimized  from a medical standpoint   VS: BP 120/74   Pulse 97   Temp 36.6 C (Oral)   Resp 18   Ht 6' (1.829 m)   SpO2 100%   BMI 31.03 kg/m   PROVIDERS: [name], MD is PCP   Cardiologist - Wilbert Bihari, MD  LABS: Labs reviewed: Acceptable for surgery. (all labs ordered are listed, but only abnormal results are displayed)  Labs Reviewed  BASIC METABOLIC PANEL WITH GFR - Abnormal; Notable for the following components:      Result Value   Glucose, Bld 102 (*)    Creatinine, Ser 1.41 (*)    GFR, Estimated 52 (*)    All other components within normal limits  CBC - Abnormal; Notable for the following components:   RBC 3.44 (*)    Hemoglobin 9.4 (*)    HCT 31.7 (*)    MCHC 29.7 (*)    All other components within normal limits  HEMOGLOBIN A1C  TYPE AND SCREEN     IMAGES:   EKG:   CV: Echo 12/13/23  1. Left ventricular ejection fraction, by estimation, is 25 to 30%. The  left ventricle has severely decreased function. The left ventricle  demonstrates global hypokinesis. There is mild left ventricular  hypertrophy. Left ventricular diastolic parameters   are indeterminate.   2. Right ventricular systolic function is normal. The right ventricular  size is normal. There is mildly elevated pulmonary artery systolic  pressure. The estimated right ventricular systolic pressure is 43.9 mmHg.   3. The mitral valve is normal in structure. Mild to moderate mitral  valve  regurgitation.   4. The aortic valve is tricuspid. Aortic valve regurgitation is not  visualized. No aortic stenosis is present.   5. Aortic dilatation noted. There is dilatation of the ascending aorta,  measuring 41 mm.   6. The inferior vena cava is dilated in size with <50% respiratory  variability, suggesting right atrial pressure of 15 mmHg.   CARDIAC CATHETERIZATION 07/03/2019   Conclusion Findings:   Ao = 117/77 (92) LV =  112/13 RA =  7 RV = 28/9 PA = 28/12 (18) PCW = 11 Fick cardiac  output/index = 6.0/2.4 PVR = 1.2 WU Ao sat = 99% PA sat = 69%, 71%   Assessment:   1. Normal coronaries 2. NICM with EF 25-30% 3. Normal hemodynamics Past Medical History:  Diagnosis Date   Acute cystitis 05/13/2023   Acute hyponatremia 05/13/2023   Acute kidney injury superimposed on chronic kidney disease (HCC) 05/13/2023   CLINICAL CONTEXT: 73 year old male with stage 3 CKD, colovesical fistula, recurrent UTIs, and recent candidal UTI. Recent renal ultrasound (09/28/2023) confirmed mild left hydronephrosis and prostatic mass protruding into bladder base. Iron  studies consistent with anemia of chronic disease (iron  32, TIBC 238, ferritin 420).   DIAGNOSTIC ASSESSMENT: ? 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 History of GI bleeding (07/2023). Currently on ferrous sulfate  supplementation.   DIAGNOSTIC ASSESSMENT: ? Primary etiology (70-80% probability): Anemia of chronic disease/inflammation ? 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  EF 35 to 40% on echo 04/2021   Dementia (HCC)    Depression    Diarrhea 05/13/2023   E coli bacteremia  05/14/2023   High anion gap metabolic acidosis 07/13/2023   History of Clostridioides difficile colitis 08/15/2023   Associated with colovesical fistula? Recurrent antibiotic(s) requirement     Hyperkalemia    Associated with losartan   Advised patient low potassium diet 08/2022             Lab Results      Component    Value    Date/Time           K    4.0    12/27/2023 01:20 PM           K    4.4    12/18/2023 05:07 AM           K    4.4    12/17/2023 06:01 AM           K    4.7    12/16/2023 06:15 AM           K    4.9    12/15/2023 08:21 AM           K  4.4    12/14/2023 06:00 AM           K    5.0    Hyperlipidemia    Hypertension    Leukocytosis 12/11/2023   Lab Results      Component    Value    Date/Time           WBC    6.2    12/27/2023 01:20 PM           WBC    11.6 (H)    12/18/2023 05:07 AM           WBC    9.6    12/17/2023 06:01 AM           WBC    9.4    12/16/2023 06:15 AM           WBC    12.7 (H)    12/15/2023 08:21 AM           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  Work-up with cardiology in January 2021.     catheterization    Assessment:     1. Normal coronaries  2. NICM with EF 25-30%  3. Normal hemodynamics  .     Pneumonia    PUD (peptic ulcer disease) 01/12/2015   Formatting of this note might be different from the original.  Endoscopy esophageal stricture 2016     Septic shock from UTI 06/02/2023   Severe sepsis (HCC) 06/03/2023   SIRS (systemic inflammatory response syndrome) (HCC) 12/11/2023   SKIN CANCER, HX OF 05/10/2007   Facial left check and forehead and r shoulder  F/w derm   Syncope 05/13/2023   Thrombocytopenia (HCC) 06/02/2023   Lab Results      Component    Value    Date           PLT    301    07/19/2023           PLT    324    07/18/2023           PLT    268    07/16/2023           PLT    207    07/14/2023           PLT     228    07/13/2023             Lab Results      Component    Value    Date           ESRSEDRATE    60 (H)    05/08/2023     No results found for: CRP          Lab Results      Component    Value       Urinary catheter complication (HCC) 06/24/2023   Urinary dribbling 03/14/2022   Urinary incontinence 11/21/2022      Urinary Incontinence: They are wearing disposable diapers daily due to urinary incontinence, primarily nocturnal, and are currently on Tamsulosin  (Flomax ). We will refer them to Urology for further evaluation and potential treatment options and continue Tamsulosin  until they are seen by Urology.     Urinary tract infection without hematuria 06/02/2023   UTI (urinary tract infection) 07/01/2023    Past Surgical History:  Procedure Laterality Date   COLONOSCOPY     COLONOSCOPY N/A 12/05/2023   Procedure: COLONOSCOPY;  Surgeon: Leigh Elspeth SQUIBB, MD;  Location: [facility] ENDOSCOPY;  Service: Gastroenterology;  Laterality: N/A;   RIGHT/LEFT HEART CATH AND CORONARY ANGIOGRAPHY N/A 07/03/2019   Procedure: RIGHT/LEFT HEART CATH AND CORONARY ANGIOGRAPHY;  Surgeon: Cherrie Toribio SAUNDERS, MD;  Location: [facility] INVASIVE CV LAB;  Service: Cardiovascular;  Laterality: N/A;    MEDICATIONS:  ARIPiprazole  (ABILIFY ) 2 MG tablet   busPIRone  (BUSPAR ) 10 MG tablet   carvedilol  (COREG ) 6.25 MG tablet   docusate sodium  (COLACE) 100 MG capsule   finasteride  (PROSCAR ) 5 MG tablet   folic acid  (FOLVITE ) 1 MG tablet   furosemide  (LASIX ) 40 MG tablet   gabapentin  (NEURONTIN ) 100 MG capsule   melatonin 3 MG TABS tablet   mirtazapine  (REMERON ) 30 MG tablet   pravastatin  (PRAVACHOL ) 20 MG tablet   primidone  (MYSOLINE ) 50 MG tablet   tamsulosin  (FLOMAX ) 0.4 MG CAPS capsule   traZODone  (DESYREL ) 100 MG tablet   venlafaxine  XR (EFFEXOR -XR) 150 MG 24 hr capsule   venlafaxine  XR (EFFEXOR -XR) 75 MG 24 hr capsule   No current facility-administered medications for this encounter.     Harlene Hoots Ward, PA-C [facility]  Pre-Surgical Testing [phone]

## 2024-01-07 ENCOUNTER — Other Ambulatory Visit: Payer: Self-pay | Admitting: *Deleted

## 2024-01-07 NOTE — Patient Outreach (Signed)
 Transition of Care week 4  Visit Note  01/07/2024  Name: Calvin Escobar MRN: [id number]          DOB: Dec 15, 1950  Situation: Patient enrolled in [facility] 30-day program. Visit completed with patient by telephone.   Background:     Past Medical History:  Diagnosis Date   Acute cystitis 05/13/2023   Acute hyponatremia 05/13/2023   Acute kidney injury superimposed on chronic kidney disease (HCC) 05/13/2023   CLINICAL CONTEXT: 73 year old male with stage 3 CKD, colovesical fistula, recurrent UTIs, and recent candidal UTI. Recent renal ultrasound (09/28/2023) confirmed mild left hydronephrosis and prostatic mass protruding into bladder base. Iron  studies consistent with anemia of chronic disease (iron  32, TIBC 238, ferritin 420).   DIAGNOSTIC ASSESSMENT: ? 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 History of GI bleeding (07/2023). Currently on ferrous sulfate  supplementation.   DIAGNOSTIC ASSESSMENT: ? Primary etiology (70-80% probability): Anemia of chronic disease/inflammation ? 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  EF 35 to 40% on echo 04/2021    Dementia (HCC)    Depression    Diarrhea 05/13/2023   E coli bacteremia 05/14/2023   High anion gap metabolic acidosis 07/13/2023   History of Clostridioides difficile colitis 08/15/2023   Associated with colovesical fistula? Recurrent antibiotic(s) requirement     Hyperkalemia    Associated with losartan   Advised patient low potassium diet 08/2022             Lab Results      Component    Value    Date/Time           K    4.0    12/27/2023 01:20 PM           K    4.4    12/18/2023 05:07 AM           K    4.4    12/17/2023 06:01 AM           K    4.7    12/16/2023 06:15 AM           K    4.9    12/15/2023 08:21 AM           K    4.4    12/14/2023 06:00 AM           K    5.0    Hyperlipidemia    Hypertension    Leukocytosis 12/11/2023   Lab Results      Component    Value    Date/Time           WBC    6.2    12/27/2023 01:20 PM  WBC    11.6 (H)    12/18/2023 05:07 AM           WBC    9.6    12/17/2023 06:01 AM           WBC    9.4    12/16/2023 06:15 AM           WBC    12.7 (H)    12/15/2023 08:21 AM           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  Work-up with cardiology in January 2021.     catheterization    Assessment:     1. Normal coronaries  2. NICM with EF 25-30%  3. Normal hemodynamics  .     Pneumonia    PUD (peptic ulcer disease) 01/12/2015   Formatting of this note might be different from the original.  Endoscopy esophageal stricture 2016     Septic shock from UTI 06/02/2023   Severe sepsis (HCC) 06/03/2023   SIRS (systemic inflammatory response syndrome) (HCC) 12/11/2023   SKIN CANCER, HX OF 05/10/2007   Facial left check and forehead and r shoulder  F/w derm   Syncope 05/13/2023   Thrombocytopenia (HCC) 06/02/2023   Lab Results      Component    Value    Date           PLT    301    07/19/2023           PLT    324    07/18/2023           PLT     268    07/16/2023           PLT    207    07/14/2023           PLT    228    07/13/2023             Lab Results      Component    Value    Date           ESRSEDRATE    60 (H)    05/08/2023     No results found for: CRP          Lab Results      Component    Value       Urinary catheter complication (HCC) 06/24/2023   Urinary dribbling 03/14/2022   Urinary incontinence 11/21/2022      Urinary Incontinence: They are wearing disposable diapers daily due to urinary incontinence, primarily nocturnal, and are currently on Tamsulosin  (Flomax ). We will refer them to Urology for further evaluation and potential treatment options and continue Tamsulosin  until they are seen by Urology.     Urinary tract infection without hematuria 06/02/2023   UTI (urinary tract infection) 07/01/2023    Assessment: Patient Reported Symptoms: Cognitive Cognitive Status: No symptoms reported, Able to follow simple commands, Alert and oriented to person, place, and time      Neurological Neurological Review of Symptoms: No symptoms reported    HEENT HEENT Symptoms Reported: No symptoms reported      Cardiovascular Cardiovascular Symptoms Reported: No symptoms reported Does patient have uncontrolled Hypertension?: No Cardiovascular Management Strategies: Adequate rest, Medication therapy, Routine screening Cardiovascular Self-Management Outcome: 4 (good) Cardiovascular Comment: HF- pt not interested in weighing,  reinforced HF action plan  Respiratory Respiratory Symptoms Reported: No symptoms reported Additional Respiratory Details: reviewed signs/ symptoms of infection Respiratory Management Strategies: Adequate rest Respiratory Self-Management Outcome: 4 (good)  Endocrine Endocrine Symptoms Reported: No symptoms reported Is patient diabetic?: No    Gastrointestinal Gastrointestinal Symptoms Reported: No symptoms reported Gastrointestinal Comment: pt scheduled for sigmoid colectomy 01/09/24, preadmission  testing completed    Genitourinary Genitourinary Symptoms Reported: No symptoms reported Additional Genitourinary Details: no concerns reported with foley catheter, patent, draining well, reinforced signs /symptoms UTI Genitourinary Management Strategies: Catheter, indwelling Genitourinary Self-Management Outcome: 4 (good)  Integumentary Integumentary Symptoms Reported: No symptoms reported    Musculoskeletal Musculoskelatal Symptoms Reviewed: No symptoms reported        Psychosocial Psychosocial Symptoms Reported: No symptoms reported         There were no vitals filed for this visit.   Goals Addressed             This Visit's Progress    [facility]BCI Transitions of Care (TOC) Care Plan       Problems:  New admission: 7/9 - 7/17 Jol[facility] Pack Primary Diagnosis: COVID - 19 & UTI Patient stated several times during initial call that he was hoping to just rest today - TOC RN was unable to complete full initial assessment - Patient did allow TOC RN to help with scheduling PCP hospital follow follow up and contact [facility] to discuss when they will do ROC  - PCP visit 12/26/13 and Home Health Tuesday 12/23/23 Patient states he will call and schedule other appointments stating,  I was in the hospital for almost 9 days and I just got home yesterday late. Today I just want to rest and get used to being at home.  Unable to review medications - patient states his wife is at work and she fills his pill box  12/24/23- spoke with patient who reports  feeling much better   has intermittent cough, expectorating small amounts of phlegm, no fever or signs of infection, home health nurse is working with pt and providing oversight with medications per pt, no issues reported with foley catheter 12/31/23- pt reports feeling better, getting stronger, continues working with home health, will have preadmission testing 7/30 in preparation for sigmoid colectomy on 01/09/24, no new concerns reported Unable to complete  medication reconciliation today with patient (he is unable and spouse is at work, unable to speak with spouse) 01/07/24- spoke with pt who reports he has all medications and taking as prescribed, unable to review due to his spouse is not home, unable to speak with spouse, pt to have sigmoid colectomy on 01/09/24, no new concerns reported  Prior Admission Information:  Recent Hospitalization for treatment of Candidemia, pt reports no issues with foley catheter, states  urine is clear SDOH barrier: transportation Patient is able to drive but has problems getting to specialist's appointments, pt is able to drive to primary care provider office, patient's spouse works full time and provides oversight for all medications, assists as needed, adult son lives with pt but does not assist  Goal:  Over the next 30 days, the patient will not experience hospital readmission  Interventions: Covid 19 & UTI Evaluation of current treatment plan related to Covid 19 & UTI, self-management and patient's adherence to plan as established by provider. Discussed plans with patient for ongoing care management follow up and provided patient with direct contact information for care management team Reviewed all upcoming scheduled appointments and surgery Reinforced signs/ symptoms of  infection Reinforced care of foley catheter Reviewed signs /symptoms of respiratory infection, reportable signs/ symptoms Reviewed plan of care with pt including case closure today due to pt is scheduled for surgery 01/09/24  Patient Self Care Activities:  Attend all scheduled provider appointments Attend church or other social activities Call pharmacy for medication refills 3-7 days in advance of running out of medications Call provider office for new concerns or questions  Notify RN Care Manager of TOC call rescheduling needs Participate in Transition of Care Program/Attend TOC scheduled calls Perform all self care activities independently   Take medications as prescribed   Continue follow up with Sheldon Standing, MD. Call. Specialties: General Surgery, Colon and Rectal Surgery to scheduling surgery for colon resection with takedown of colovesical fistula Manny, Ricardo KATHEE Raddle., MD Urology Indwelling foley catheter [name], MD cardiology Follow low sodium heart healthy diet with 1.5L fluid restriction  Case closure  Plan:  No further follow up required: pt will be admitted to hospital on 01/09/24 for surgery The patient has been provided with contact information for the care management team and has been advised to call with any health related questions or concerns.         Recommendation:   PCP Follow-up   Follow Up Plan:   Closing From:  Transitions of Care Program  Mliss Creed [facility], BSN RN Care Manager/ Transition of Care [place]/ [facility] [phone]

## 2024-01-07 NOTE — Patient Instructions (Signed)
 Visit Information  Thank you for taking time to visit with me today. Please don't hesitate to contact me if I can be of assistance to you before our next scheduled telephone appointment.  Following is a copy of your care plan:   Goals Addressed             This Visit's Progress    [facility]BCI Transitions of Care (TOC) Care Plan       Problems:  New admission: 7/9 - 7/17 Jolynn Pack Primary Diagnosis: COVID - 19 & UTI Patient stated several times during initial call that he was hoping to just rest today - TOC RN was unable to complete full initial assessment - Patient did allow TOC RN to help with scheduling PCP hospital follow follow up and contact [facility] to discuss when they will do ROC  - PCP visit 12/26/13 and Home Health Tuesday 12/23/23 Patient states he will call and schedule other appointments stating,  I was in the hospital for almost 9 days and I just got home yesterday late. Today I just want to rest and get used to being at home.  Unable to review medications - patient states his wife is at work and she fills his pill box  12/24/23- spoke with patient who reports  feeling much better   has intermittent cough, expectorating small amounts of phlegm, no fever or signs of infection, home health nurse is working with pt and providing oversight with medications per pt, no issues reported with foley catheter 12/31/23- pt reports feeling better, getting stronger, continues working with home health, will have preadmission testing 7/30 in preparation for sigmoid colectomy on 01/09/24, no new concerns reported Unable to complete medication reconciliation today with patient (he is unable and spouse is at work, unable to speak with spouse) 01/07/24- spoke with pt who reports he has all medications and taking as prescribed, unable to review due to his spouse is not home, unable to speak with spouse, pt to have sigmoid colectomy on 01/09/24, no new concerns reported  Prior Admission Information:  Recent  Hospitalization for treatment of Candidemia, pt reports no issues with foley catheter, states  urine is clear SDOH barrier: transportation Patient is able to drive but has problems getting to specialist's appointments, pt is able to drive to primary care provider office, patient's spouse works full time and provides oversight for all medications, assists as needed, adult son lives with pt but does not assist  Goal:  Over the next 30 days, the patient will not experience hospital readmission  Interventions: Covid 19 & UTI Evaluation of current treatment plan related to Covid 19 & UTI, self-management and patient's adherence to plan as established by provider. Discussed plans with patient for ongoing care management follow up and provided patient with direct contact information for care management team Reviewed all upcoming scheduled appointments and surgery Reinforced signs/ symptoms of infection Reinforced care of foley catheter Reviewed signs /symptoms of respiratory infection, reportable signs/ symptoms Reviewed plan of care with pt including case closure today due to pt is scheduled for surgery 01/09/24  Patient Self Care Activities:  Attend all scheduled provider appointments Attend church or other social activities Call pharmacy for medication refills 3-7 days in advance of running out of medications Call provider office for new concerns or questions  Notify RN Care Manager of TOC call rescheduling needs Participate in Transition of Care Program/Attend TOC scheduled calls Perform all self care activities independently  Take medications as prescribed   Continue follow  up with Sheldon Standing, MD. Call. Specialties: General Surgery, Colon and Rectal Surgery to scheduling surgery for colon resection with takedown of colovesical fistula Manny, Ricardo KATHEE Raddle., MD Urology Indwelling foley catheter [name], MD cardiology Follow low sodium heart healthy diet with 1.5L fluid restriction   Case closure  Plan:  No further follow up required: pt will be admitted to hospital on 01/09/24 for surgery The patient has been provided with contact information for the care management team and has been advised to call with any health related questions or concerns.         Patient verbalizes understanding of instructions and care plan provided today and agrees to view in MyChart. Active MyChart status and patient understanding of how to access instructions and care plan via MyChart confirmed with patient.     No further follow up required: case closure  Please call the care guide team at [phone] if you need to cancel or reschedule your appointment.   Please call the Suicide and Crisis Lifeline: 988 call the [place]  National Suicide Prevention Lifeline: 260-101-2956 or TTY: (726)863-3804 TTY 6028716336) to talk to a trained counselor call 1-800-273-TALK (toll free, 24 hour hotline) go to [facility] [address] [place] [phone]) call 911 if you are experiencing a Mental Health or Behavioral Health Crisis or need someone to talk to.  Mliss Creed [facility], BSN RN Care Manager/ Transition of Care [place]/ [facility] [phone]

## 2024-01-09 ENCOUNTER — Inpatient Hospital Stay (HOSPITAL_COMMUNITY): Payer: Self-pay | Admitting: Anesthesiology

## 2024-01-09 ENCOUNTER — Inpatient Hospital Stay (HOSPITAL_COMMUNITY): Payer: Self-pay | Admitting: Medical

## 2024-01-09 ENCOUNTER — Encounter (HOSPITAL_COMMUNITY)
Admission: RE | Disposition: A | Discharge status: Skilled Nursing Facility | Payer: Self-pay | Source: Home / Self Care | Attending: Surgery

## 2024-01-09 ENCOUNTER — Encounter (HOSPITAL_COMMUNITY): Payer: Self-pay | Admitting: Surgery

## 2024-01-09 ENCOUNTER — Inpatient Hospital Stay (HOSPITAL_COMMUNITY)

## 2024-01-09 ENCOUNTER — Other Ambulatory Visit: Payer: Self-pay

## 2024-01-09 ENCOUNTER — Inpatient Hospital Stay (HOSPITAL_COMMUNITY)
Admission: RE | Admit: 2024-01-09 | Discharge: 2024-01-14 | DRG: 673 | Disposition: A | Discharge status: Skilled Nursing Facility | Attending: Surgery | Admitting: Surgery

## 2024-01-09 DIAGNOSIS — Z8616 Personal history of COVID-19: Secondary | ICD-10-CM | POA: Diagnosis not present

## 2024-01-09 DIAGNOSIS — M130 Polyarthritis, unspecified: Secondary | ICD-10-CM | POA: Diagnosis present

## 2024-01-09 DIAGNOSIS — F03918 Unspecified dementia, unspecified severity, with other behavioral disturbance: Secondary | ICD-10-CM | POA: Diagnosis present

## 2024-01-09 DIAGNOSIS — M549 Dorsalgia, unspecified: Secondary | ICD-10-CM | POA: Diagnosis present

## 2024-01-09 DIAGNOSIS — N321 Vesicointestinal fistula: Secondary | ICD-10-CM

## 2024-01-09 DIAGNOSIS — Z87440 Personal history of urinary (tract) infections: Secondary | ICD-10-CM

## 2024-01-09 DIAGNOSIS — K56699 Other intestinal obstruction unspecified as to partial versus complete obstruction: Secondary | ICD-10-CM | POA: Diagnosis present

## 2024-01-09 DIAGNOSIS — F0393 Unspecified dementia, unspecified severity, with mood disturbance: Secondary | ICD-10-CM | POA: Diagnosis present

## 2024-01-09 DIAGNOSIS — E785 Hyperlipidemia, unspecified: Secondary | ICD-10-CM | POA: Diagnosis present

## 2024-01-09 DIAGNOSIS — I5022 Chronic systolic (congestive) heart failure: Secondary | ICD-10-CM | POA: Diagnosis present

## 2024-01-09 DIAGNOSIS — G4733 Obstructive sleep apnea (adult) (pediatric): Secondary | ICD-10-CM | POA: Diagnosis present

## 2024-01-09 DIAGNOSIS — Z85828 Personal history of other malignant neoplasm of skin: Secondary | ICD-10-CM

## 2024-01-09 DIAGNOSIS — K219 Gastro-esophageal reflux disease without esophagitis: Secondary | ICD-10-CM | POA: Diagnosis not present

## 2024-01-09 DIAGNOSIS — Z4682 Encounter for fitting and adjustment of non-vascular catheter: Secondary | ICD-10-CM | POA: Diagnosis not present

## 2024-01-09 DIAGNOSIS — R338 Other retention of urine: Secondary | ICD-10-CM | POA: Diagnosis present

## 2024-01-09 DIAGNOSIS — D62 Acute posthemorrhagic anemia: Secondary | ICD-10-CM | POA: Diagnosis not present

## 2024-01-09 DIAGNOSIS — I130 Hypertensive heart and chronic kidney disease with heart failure and stage 1 through stage 4 chronic kidney disease, or unspecified chronic kidney disease: Secondary | ICD-10-CM | POA: Diagnosis present

## 2024-01-09 DIAGNOSIS — F32A Depression, unspecified: Secondary | ICD-10-CM | POA: Diagnosis present

## 2024-01-09 DIAGNOSIS — G4700 Insomnia, unspecified: Secondary | ICD-10-CM | POA: Diagnosis present

## 2024-01-09 DIAGNOSIS — N400 Enlarged prostate without lower urinary tract symptoms: Secondary | ICD-10-CM | POA: Diagnosis not present

## 2024-01-09 DIAGNOSIS — N3941 Urge incontinence: Secondary | ICD-10-CM | POA: Diagnosis present

## 2024-01-09 DIAGNOSIS — R339 Retention of urine, unspecified: Secondary | ICD-10-CM | POA: Diagnosis not present

## 2024-01-09 DIAGNOSIS — J42 Unspecified chronic bronchitis: Secondary | ICD-10-CM | POA: Diagnosis present

## 2024-01-09 DIAGNOSIS — E66813 Obesity, class 3: Secondary | ICD-10-CM | POA: Diagnosis present

## 2024-01-09 DIAGNOSIS — I420 Dilated cardiomyopathy: Secondary | ICD-10-CM | POA: Diagnosis present

## 2024-01-09 DIAGNOSIS — R0602 Shortness of breath: Secondary | ICD-10-CM | POA: Diagnosis not present

## 2024-01-09 DIAGNOSIS — R6520 Severe sepsis without septic shock: Secondary | ICD-10-CM | POA: Diagnosis not present

## 2024-01-09 DIAGNOSIS — Z978 Presence of other specified devices: Secondary | ICD-10-CM

## 2024-01-09 DIAGNOSIS — Z6836 Body mass index (BMI) 36.0-36.9, adult: Secondary | ICD-10-CM | POA: Diagnosis not present

## 2024-01-09 DIAGNOSIS — E440 Moderate protein-calorie malnutrition: Secondary | ICD-10-CM | POA: Diagnosis not present

## 2024-01-09 DIAGNOSIS — D631 Anemia in chronic kidney disease: Secondary | ICD-10-CM | POA: Diagnosis present

## 2024-01-09 DIAGNOSIS — L42 Pityriasis rosea: Secondary | ICD-10-CM | POA: Diagnosis present

## 2024-01-09 DIAGNOSIS — R1084 Generalized abdominal pain: Secondary | ICD-10-CM | POA: Diagnosis not present

## 2024-01-09 DIAGNOSIS — Z8249 Family history of ischemic heart disease and other diseases of the circulatory system: Secondary | ICD-10-CM

## 2024-01-09 DIAGNOSIS — T839XXD Unspecified complication of genitourinary prosthetic device, implant and graft, subsequent encounter: Secondary | ICD-10-CM | POA: Diagnosis not present

## 2024-01-09 DIAGNOSIS — N401 Enlarged prostate with lower urinary tract symptoms: Secondary | ICD-10-CM | POA: Diagnosis present

## 2024-01-09 DIAGNOSIS — R6339 Other feeding difficulties: Secondary | ICD-10-CM | POA: Diagnosis present

## 2024-01-09 DIAGNOSIS — K5792 Diverticulitis of intestine, part unspecified, without perforation or abscess without bleeding: Secondary | ICD-10-CM | POA: Diagnosis not present

## 2024-01-09 DIAGNOSIS — K660 Peritoneal adhesions (postprocedural) (postinfection): Secondary | ICD-10-CM | POA: Diagnosis not present

## 2024-01-09 DIAGNOSIS — Z87898 Personal history of other specified conditions: Secondary | ICD-10-CM

## 2024-01-09 DIAGNOSIS — E871 Hypo-osmolality and hyponatremia: Secondary | ICD-10-CM | POA: Diagnosis not present

## 2024-01-09 DIAGNOSIS — K5730 Diverticulosis of large intestine without perforation or abscess without bleeding: Secondary | ICD-10-CM | POA: Diagnosis not present

## 2024-01-09 DIAGNOSIS — K5720 Diverticulitis of large intestine with perforation and abscess without bleeding: Secondary | ICD-10-CM | POA: Diagnosis present

## 2024-01-09 DIAGNOSIS — K651 Peritoneal abscess: Secondary | ICD-10-CM | POA: Diagnosis present

## 2024-01-09 DIAGNOSIS — E66812 Obesity, class 2: Secondary | ICD-10-CM | POA: Diagnosis present

## 2024-01-09 DIAGNOSIS — F419 Anxiety disorder, unspecified: Secondary | ICD-10-CM | POA: Diagnosis present

## 2024-01-09 DIAGNOSIS — N1831 Chronic kidney disease, stage 3a: Secondary | ICD-10-CM | POA: Diagnosis not present

## 2024-01-09 DIAGNOSIS — N390 Urinary tract infection, site not specified: Secondary | ICD-10-CM | POA: Diagnosis not present

## 2024-01-09 DIAGNOSIS — I272 Pulmonary hypertension, unspecified: Secondary | ICD-10-CM | POA: Diagnosis present

## 2024-01-09 DIAGNOSIS — N35819 Other urethral stricture, male, unspecified site: Secondary | ICD-10-CM

## 2024-01-09 DIAGNOSIS — F0390 Unspecified dementia without behavioral disturbance: Secondary | ICD-10-CM | POA: Diagnosis present

## 2024-01-09 DIAGNOSIS — Z7401 Bed confinement status: Secondary | ICD-10-CM | POA: Diagnosis not present

## 2024-01-09 DIAGNOSIS — D638 Anemia in other chronic diseases classified elsewhere: Secondary | ICD-10-CM | POA: Diagnosis not present

## 2024-01-09 DIAGNOSIS — E8729 Other acidosis: Secondary | ICD-10-CM | POA: Diagnosis not present

## 2024-01-09 DIAGNOSIS — Z8711 Personal history of peptic ulcer disease: Secondary | ICD-10-CM

## 2024-01-09 DIAGNOSIS — N35919 Unspecified urethral stricture, male, unspecified site: Secondary | ICD-10-CM | POA: Diagnosis present

## 2024-01-09 DIAGNOSIS — Z452 Encounter for adjustment and management of vascular access device: Secondary | ICD-10-CM | POA: Diagnosis not present

## 2024-01-09 DIAGNOSIS — R0902 Hypoxemia: Secondary | ICD-10-CM | POA: Diagnosis not present

## 2024-01-09 DIAGNOSIS — Z8701 Personal history of pneumonia (recurrent): Secondary | ICD-10-CM

## 2024-01-09 DIAGNOSIS — I10 Essential (primary) hypertension: Secondary | ICD-10-CM | POA: Diagnosis present

## 2024-01-09 DIAGNOSIS — K565 Intestinal adhesions [bands], unspecified as to partial versus complete obstruction: Secondary | ICD-10-CM | POA: Diagnosis not present

## 2024-01-09 DIAGNOSIS — F5101 Primary insomnia: Secondary | ICD-10-CM | POA: Diagnosis present

## 2024-01-09 DIAGNOSIS — K5732 Diverticulitis of large intestine without perforation or abscess without bleeding: Secondary | ICD-10-CM | POA: Insufficient documentation

## 2024-01-09 DIAGNOSIS — Z79899 Other long term (current) drug therapy: Secondary | ICD-10-CM

## 2024-01-09 DIAGNOSIS — F3130 Bipolar disorder, current episode depressed, mild or moderate severity, unspecified: Secondary | ICD-10-CM | POA: Diagnosis present

## 2024-01-09 DIAGNOSIS — Z91411 Personal history of adult psychological abuse: Secondary | ICD-10-CM

## 2024-01-09 DIAGNOSIS — Z932 Ileostomy status: Secondary | ICD-10-CM

## 2024-01-09 DIAGNOSIS — G8929 Other chronic pain: Secondary | ICD-10-CM | POA: Diagnosis present

## 2024-01-09 DIAGNOSIS — R262 Difficulty in walking, not elsewhere classified: Secondary | ICD-10-CM | POA: Diagnosis not present

## 2024-01-09 DIAGNOSIS — N3000 Acute cystitis without hematuria: Secondary | ICD-10-CM | POA: Diagnosis not present

## 2024-01-09 DIAGNOSIS — I5020 Unspecified systolic (congestive) heart failure: Secondary | ICD-10-CM | POA: Diagnosis present

## 2024-01-09 DIAGNOSIS — R531 Weakness: Secondary | ICD-10-CM | POA: Diagnosis not present

## 2024-01-09 DIAGNOSIS — K5669 Other partial intestinal obstruction: Secondary | ICD-10-CM | POA: Diagnosis not present

## 2024-01-09 HISTORY — PX: ROBOTIC ASSISTED LAPAROSCOPIC LYSIS OF ADHESION: SHX6080

## 2024-01-09 HISTORY — PX: FLEXIBLE SIGMOIDOSCOPY: SHX5431

## 2024-01-09 HISTORY — PX: LAPAROSCOPIC LOOP COLOSTOMY: SHX6816

## 2024-01-09 HISTORY — DX: Personal history of other specified conditions: Z87.898

## 2024-01-09 HISTORY — DX: Presence of other specified devices: Z97.8

## 2024-01-09 HISTORY — PX: CYSTOSCOPY WITH INDOCYANINE GREEN IMAGING (ICG): SHX7549

## 2024-01-09 HISTORY — DX: Personal history of COVID-19: Z86.16

## 2024-01-09 HISTORY — PX: COLECTOMY, SIGMOID, ROBOT-ASSISTED: SHX7542

## 2024-01-09 LAB — TYPE AND SCREEN
ABO/RH(D): O POS
Antibody Screen: NEGATIVE

## 2024-01-09 LAB — COMPREHENSIVE METABOLIC PANEL WITH GFR
ALT: 10 U/L (ref 0–44)
AST: 18 U/L (ref 15–41)
Albumin: 3.1 g/dL — ABNORMAL LOW (ref 3.5–5.0)
Alkaline Phosphatase: 73 U/L (ref 38–126)
Anion gap: 11 (ref 5–15)
BUN: 15 mg/dL (ref 8–23)
CO2: 20 mmol/L — ABNORMAL LOW (ref 22–32)
Calcium: 8.2 mg/dL — ABNORMAL LOW (ref 8.9–10.3)
Chloride: 104 mmol/L (ref 98–111)
Creatinine, Ser: 1.54 mg/dL — ABNORMAL HIGH (ref 0.61–1.24)
GFR, Estimated: 47 mL/min — ABNORMAL LOW (ref >60)
Glucose, Bld: 161 mg/dL — ABNORMAL HIGH (ref 70–99)
Potassium: 4.1 mmol/L (ref 3.5–5.1)
Sodium: 135 mmol/L (ref 135–145)
Total Bilirubin: 0.4 mg/dL (ref 0.0–1.2)
Total Protein: 6.6 g/dL (ref 6.5–8.1)

## 2024-01-09 LAB — CBC
HCT: 30.2 % — ABNORMAL LOW (ref 39.0–52.0)
Hemoglobin: 9 g/dL — ABNORMAL LOW (ref 13.0–17.0)
MCH: 27.3 pg (ref 26.0–34.0)
MCHC: 29.8 g/dL — ABNORMAL LOW (ref 30.0–36.0)
MCV: 91.5 fL (ref 80.0–100.0)
Platelets: 182 K/uL (ref 150–400)
RBC: 3.3 MIL/uL — ABNORMAL LOW (ref 4.22–5.81)
RDW: 14.6 % (ref 11.5–15.5)
WBC: 13.9 K/uL — ABNORMAL HIGH (ref 4.0–10.5)
nRBC: 0 % (ref 0.0–0.2)

## 2024-01-09 LAB — PHOSPHORUS: Phosphorus: 3.5 mg/dL (ref 2.5–4.6)

## 2024-01-09 LAB — GLUCOSE, CAPILLARY
Glucose-Capillary: 178 mg/dL — ABNORMAL HIGH (ref 70–99)
Glucose-Capillary: 157 mg/dL — ABNORMAL HIGH (ref 70–99)
Glucose-Capillary: 146 mg/dL — ABNORMAL HIGH (ref 70–99)

## 2024-01-09 LAB — MAGNESIUM: Magnesium: 1.8 mg/dL (ref 1.7–2.4)

## 2024-01-09 LAB — MRSA NEXT GEN BY PCR, NASAL: MRSA by PCR Next Gen: NOT DETECTED

## 2024-01-09 SURGERY — COLECTOMY, SIGMOID, ROBOT-ASSISTED
Anesthesia: General

## 2024-01-09 MED ORDER — 0.9 % SODIUM CHLORIDE (POUR BTL) OPTIME
TOPICAL | Status: DC | PRN
  Administered 2024-01-09: 2000 mL

## 2024-01-09 MED ORDER — SPY AGENT GREEN - (INDOCYANINE FOR INJECTION)
INTRAMUSCULAR | Status: DC | PRN
  Administered 2024-01-09: 5 mL via INTRAVENOUS

## 2024-01-09 MED ORDER — INSULIN ASPART 100 UNIT/ML IJ SOLN: 0.0000 [IU] | Freq: Every day | INTRAMUSCULAR | Status: DC

## 2024-01-09 MED ORDER — DIPHENHYDRAMINE HCL 12.5 MG/5ML PO ELIX: 12.5000 mg | ORAL_SOLUTION | Freq: Four times a day (QID) | ORAL | Status: DC | PRN

## 2024-01-09 MED ORDER — STERILE WATER FOR INJECTION IJ SOLN
INTRAMUSCULAR | Status: DC | PRN
  Administered 2024-01-09: 15 mL

## 2024-01-09 MED ORDER — LIDOCAINE 20MG/ML (2%) 15 ML SYRINGE OPTIME
INTRAMUSCULAR | Status: DC | PRN
  Administered 2024-01-09: 1.5 mg/kg/h via INTRAVENOUS

## 2024-01-09 MED ORDER — ACETAMINOPHEN 500 MG PO TABS
1000.0000 mg | ORAL_TABLET | ORAL | Status: AC
  Administered 2024-01-09: 1000 mg via ORAL
  Filled 2024-01-09: qty 2

## 2024-01-09 MED ORDER — 0.9 % SODIUM CHLORIDE (POUR BTL) OPTIME
TOPICAL | Status: DC | PRN
Start: 2024-01-09 — End: 2024-01-09
  Administered 2024-01-09: 1000 mL

## 2024-01-09 MED ORDER — STERILE WATER FOR INJECTION IJ SOLN
INTRAMUSCULAR | Status: AC
  Filled 2024-01-09: qty 10

## 2024-01-09 MED ORDER — PROPOFOL 10 MG/ML IV BOLUS
INTRAVENOUS | Status: DC | PRN
  Administered 2024-01-09: 90 mg via INTRAVENOUS
  Administered 2024-01-09 (×2): 30 mg via INTRAVENOUS

## 2024-01-09 MED ORDER — ACETAMINOPHEN 500 MG PO TABS
1000.0000 mg | ORAL_TABLET | Freq: Once | ORAL | Status: DC
  Filled 2024-01-09: qty 2

## 2024-01-09 MED ORDER — DEXAMETHASONE SODIUM PHOSPHATE 10 MG/ML IJ SOLN
INTRAMUSCULAR | Status: AC
  Filled 2024-01-09: qty 1

## 2024-01-09 MED ORDER — HYDRALAZINE HCL 20 MG/ML IJ SOLN: 10.0000 mg | INTRAMUSCULAR | Status: DC | PRN

## 2024-01-09 MED ORDER — TRAMADOL HCL 50 MG PO TABS
50.0000 mg | ORAL_TABLET | Freq: Four times a day (QID) | ORAL | Status: DC | PRN
  Administered 2024-01-09 – 2024-01-10 (×2): 100 mg via ORAL
  Administered 2024-01-11: 50 mg via ORAL
  Administered 2024-01-11: 100 mg via ORAL
  Administered 2024-01-11 – 2024-01-12 (×2): 50 mg via ORAL
  Administered 2024-01-13 (×2): 100 mg via ORAL
  Filled 2024-01-09: qty 1
  Filled 2024-01-09 (×2): qty 2
  Filled 2024-01-09: qty 1
  Filled 2024-01-09 (×2): qty 2
  Filled 2024-01-09: qty 1

## 2024-01-09 MED ORDER — SODIUM CHLORIDE 0.9 % IV SOLN: 250.0000 mL | INTRAVENOUS | Status: DC | PRN

## 2024-01-09 MED ORDER — ROCURONIUM BROMIDE 10 MG/ML (PF) SYRINGE
PREFILLED_SYRINGE | INTRAVENOUS | Status: AC
Start: 2024-01-09 — End: 2024-01-09
  Filled 2024-01-09: qty 10

## 2024-01-09 MED ORDER — KETAMINE HCL 50 MG/5ML IJ SOSY
PREFILLED_SYRINGE | INTRAMUSCULAR | Status: AC
  Filled 2024-01-09: qty 5

## 2024-01-09 MED ORDER — VENLAFAXINE HCL ER 150 MG PO CP24
150.0000 mg | ORAL_CAPSULE | Freq: Every day | ORAL | Status: DC
  Administered 2024-01-09 – 2024-01-14 (×8): 150 mg via ORAL
  Filled 2024-01-09 (×6): qty 1

## 2024-01-09 MED ORDER — ONDANSETRON HCL 4 MG PO TABS
4.0000 mg | ORAL_TABLET | Freq: Four times a day (QID) | ORAL | Status: DC | PRN
  Administered 2024-01-09: 4 mg via ORAL
  Filled 2024-01-09: qty 1

## 2024-01-09 MED ORDER — ENSURE SURGERY PO LIQD
237.0000 mL | Freq: Two times a day (BID) | ORAL | Status: DC
  Administered 2024-01-10 – 2024-01-14 (×12): 237 mL via ORAL
  Filled 2024-01-09 (×3): qty 237

## 2024-01-09 MED ORDER — MAGIC MOUTHWASH: 15.0000 mL | Freq: Four times a day (QID) | ORAL | Status: DC | PRN

## 2024-01-09 MED ORDER — ORAL CARE MOUTH RINSE: 15.0000 mL | Freq: Once | OROMUCOSAL | Status: AC

## 2024-01-09 MED ORDER — KETAMINE HCL 50 MG/5ML IJ SOSY
PREFILLED_SYRINGE | INTRAMUSCULAR | Status: DC | PRN
  Administered 2024-01-09: 40 mg via INTRAVENOUS

## 2024-01-09 MED ORDER — ROCURONIUM BROMIDE 10 MG/ML (PF) SYRINGE
PREFILLED_SYRINGE | INTRAVENOUS | Status: AC
  Filled 2024-01-09: qty 10

## 2024-01-09 MED ORDER — FINASTERIDE 5 MG PO TABS
5.0000 mg | ORAL_TABLET | Freq: Every day | ORAL | Status: DC
  Administered 2024-01-10 – 2024-01-14 (×7): 5 mg via ORAL
  Filled 2024-01-09 (×5): qty 1

## 2024-01-09 MED ORDER — METHYLENE BLUE (ANTIDOTE) 1 % IV SOLN
INTRAVENOUS | Status: AC
  Filled 2024-01-09: qty 10

## 2024-01-09 MED ORDER — SODIUM CHLORIDE (PF) 0.9 % IJ SOLN
INTRAMUSCULAR | Status: AC
  Filled 2024-01-09: qty 10

## 2024-01-09 MED ORDER — GABAPENTIN 100 MG PO CAPS
100.0000 mg | ORAL_CAPSULE | Freq: Three times a day (TID) | ORAL | Status: DC
  Administered 2024-01-09 – 2024-01-14 (×21): 100 mg via ORAL
  Filled 2024-01-09 (×16): qty 1

## 2024-01-09 MED ORDER — SODIUM CHLORIDE 0.9% FLUSH
3.0000 mL | INTRAVENOUS | Status: DC | PRN
  Administered 2024-01-11: 3 mL via INTRAVENOUS

## 2024-01-09 MED ORDER — BUPIVACAINE-EPINEPHRINE (PF) 0.25% -1:200000 IJ SOLN
INTRAMUSCULAR | Status: DC | PRN
  Administered 2024-01-09: 60 mL via PERINEURAL

## 2024-01-09 MED ORDER — STERILE WATER FOR IRRIGATION IR SOLN
Status: DC | PRN
  Administered 2024-01-09: 1000 mL

## 2024-01-09 MED ORDER — MIRTAZAPINE 15 MG PO TABS
30.0000 mg | ORAL_TABLET | Freq: Every day | ORAL | Status: DC
Start: 2024-01-09 — End: 2024-01-14
  Administered 2024-01-09 – 2024-01-13 (×6): 30 mg via ORAL
  Filled 2024-01-09 (×5): qty 2

## 2024-01-09 MED ORDER — DIPHENHYDRAMINE HCL 50 MG/ML IJ SOLN: 12.5000 mg | Freq: Four times a day (QID) | INTRAMUSCULAR | Status: DC | PRN

## 2024-01-09 MED ORDER — HYDROMORPHONE HCL 1 MG/ML IJ SOLN
0.5000 mg | INTRAMUSCULAR | Status: DC | PRN
  Administered 2024-01-09: 1 mg via INTRAVENOUS
  Administered 2024-01-09: 2 mg via INTRAVENOUS
  Administered 2024-01-10: 1 mg via INTRAVENOUS
  Administered 2024-01-10: 2 mg via INTRAVENOUS
  Filled 2024-01-09 (×6): qty 1

## 2024-01-09 MED ORDER — METOPROLOL TARTRATE 5 MG/5ML IV SOLN: 5.0000 mg | Freq: Four times a day (QID) | INTRAVENOUS | Status: DC | PRN

## 2024-01-09 MED ORDER — TRAZODONE HCL 100 MG PO TABS
100.0000 mg | ORAL_TABLET | Freq: Every day | ORAL | Status: DC
  Administered 2024-01-09 – 2024-01-10 (×2): 100 mg via ORAL
  Filled 2024-01-09: qty 1
  Filled 2024-01-09: qty 2

## 2024-01-09 MED ORDER — DEXAMETHASONE SODIUM PHOSPHATE 10 MG/ML IJ SOLN
INTRAMUSCULAR | Status: DC | PRN
  Administered 2024-01-09: 5 mg via INTRAVENOUS

## 2024-01-09 MED ORDER — EPINEPHRINE 1 MG/10ML IJ SOSY
PREFILLED_SYRINGE | INTRAMUSCULAR | Status: AC
Start: 2024-01-09 — End: 2024-01-09
  Filled 2024-01-09: qty 10

## 2024-01-09 MED ORDER — PROCHLORPERAZINE EDISYLATE 10 MG/2ML IJ SOLN: 5.0000 mg | Freq: Four times a day (QID) | INTRAMUSCULAR | Status: DC | PRN

## 2024-01-09 MED ORDER — PROPOFOL 10 MG/ML IV BOLUS
INTRAVENOUS | Status: AC
  Filled 2024-01-09: qty 20

## 2024-01-09 MED ORDER — KCL IN DEXTROSE-NACL 20-5-0.45 MEQ/L-%-% IV SOLN
INTRAVENOUS | Status: DC
  Filled 2024-01-09: qty 1000

## 2024-01-09 MED ORDER — ALVIMOPAN 12 MG PO CAPS
12.0000 mg | ORAL_CAPSULE | Freq: Two times a day (BID) | ORAL | Status: DC
  Administered 2024-01-10 – 2024-01-11 (×3): 12 mg via ORAL
  Filled 2024-01-09 (×4): qty 1

## 2024-01-09 MED ORDER — SIMETHICONE 80 MG PO CHEW: 40.0000 mg | CHEWABLE_TABLET | Freq: Four times a day (QID) | ORAL | Status: DC | PRN

## 2024-01-09 MED ORDER — ENOXAPARIN SODIUM 40 MG/0.4ML IJ SOSY: 40.0000 mg | PREFILLED_SYRINGE | INTRAMUSCULAR | Status: DC

## 2024-01-09 MED ORDER — ALUM & MAG HYDROXIDE-SIMETH 200-200-20 MG/5ML PO SUSP: 30.0000 mL | Freq: Four times a day (QID) | ORAL | Status: DC | PRN

## 2024-01-09 MED ORDER — LIDOCAINE HCL (CARDIAC) PF 100 MG/5ML IV SOSY
PREFILLED_SYRINGE | INTRAVENOUS | Status: DC | PRN
  Administered 2024-01-09: 60 mg via INTRAVENOUS

## 2024-01-09 MED ORDER — GABAPENTIN 100 MG PO CAPS
200.0000 mg | ORAL_CAPSULE | ORAL | Status: AC
  Administered 2024-01-09: 200 mg via ORAL
  Filled 2024-01-09: qty 2

## 2024-01-09 MED ORDER — ARIPIPRAZOLE 2 MG PO TABS
2.0000 mg | ORAL_TABLET | Freq: Every morning | ORAL | Status: DC
  Administered 2024-01-09 – 2024-01-14 (×8): 2 mg via ORAL
  Filled 2024-01-09 (×6): qty 1

## 2024-01-09 MED ORDER — LACTATED RINGERS IV SOLN: INTRAVENOUS | Status: DC

## 2024-01-09 MED ORDER — CARVEDILOL 6.25 MG PO TABS
6.2500 mg | ORAL_TABLET | Freq: Two times a day (BID) | ORAL | Status: DC
  Administered 2024-01-09 – 2024-01-14 (×12): 6.25 mg via ORAL
  Filled 2024-01-09 (×10): qty 1

## 2024-01-09 MED ORDER — CHLORHEXIDINE GLUCONATE 0.12 % MT SOLN
15.0000 mL | Freq: Once | OROMUCOSAL | Status: AC
  Administered 2024-01-09: 15 mL via OROMUCOSAL

## 2024-01-09 MED ORDER — MUPIROCIN 2 % EX OINT: 1.0000 | TOPICAL_OINTMENT | Freq: Two times a day (BID) | CUTANEOUS | Status: DC

## 2024-01-09 MED ORDER — ROCURONIUM BROMIDE 10 MG/ML (PF) SYRINGE
PREFILLED_SYRINGE | INTRAVENOUS | Status: DC | PRN
  Administered 2024-01-09: 5 mg via INTRAVENOUS
  Administered 2024-01-09: 20 mg via INTRAVENOUS
  Administered 2024-01-09: 70 mg via INTRAVENOUS
  Administered 2024-01-09: 10 mg via INTRAVENOUS
  Administered 2024-01-09: 30 mg via INTRAVENOUS

## 2024-01-09 MED ORDER — LACTATED RINGERS IV SOLN: Freq: Three times a day (TID) | INTRAVENOUS | Status: AC | PRN

## 2024-01-09 MED ORDER — PROCHLORPERAZINE MALEATE 10 MG PO TABS: 10.0000 mg | ORAL_TABLET | Freq: Four times a day (QID) | ORAL | Status: DC | PRN

## 2024-01-09 MED ORDER — PHENYLEPHRINE HCL-NACL 20-0.9 MG/250ML-% IV SOLN
INTRAVENOUS | Status: AC
  Filled 2024-01-09: qty 250

## 2024-01-09 MED ORDER — PHENYLEPHRINE HCL-NACL 20-0.9 MG/250ML-% IV SOLN
INTRAVENOUS | Status: DC | PRN
  Administered 2024-01-09: 50 ug/min via INTRAVENOUS

## 2024-01-09 MED ORDER — ENOXAPARIN SODIUM 40 MG/0.4ML IJ SOSY
40.0000 mg | PREFILLED_SYRINGE | Freq: Once | INTRAMUSCULAR | Status: AC
  Administered 2024-01-09: 40 mg via SUBCUTANEOUS
  Filled 2024-01-09: qty 0.4

## 2024-01-09 MED ORDER — SUGAMMADEX SODIUM 200 MG/2ML IV SOLN
INTRAVENOUS | Status: AC
  Filled 2024-01-09: qty 2

## 2024-01-09 MED ORDER — FOLIC ACID 1 MG PO TABS
1.0000 mg | ORAL_TABLET | Freq: Every day | ORAL | Status: DC
  Administered 2024-01-09 – 2024-01-14 (×8): 1 mg via ORAL
  Filled 2024-01-09 (×6): qty 1

## 2024-01-09 MED ORDER — SUGAMMADEX SODIUM 200 MG/2ML IV SOLN
INTRAVENOUS | Status: DC | PRN
  Administered 2024-01-09: 200 mg via INTRAVENOUS

## 2024-01-09 MED ORDER — MENTHOL 3 MG MT LOZG: 1.0000 | LOZENGE | OROMUCOSAL | Status: DC | PRN

## 2024-01-09 MED ORDER — PRAVASTATIN SODIUM 20 MG PO TABS
20.0000 mg | ORAL_TABLET | Freq: Every day | ORAL | Status: DC
  Administered 2024-01-09 – 2024-01-13 (×6): 20 mg via ORAL
  Filled 2024-01-09 (×5): qty 1

## 2024-01-09 MED ORDER — ONDANSETRON HCL 4 MG/2ML IJ SOLN
INTRAMUSCULAR | Status: AC
  Filled 2024-01-09: qty 2

## 2024-01-09 MED ORDER — ALVIMOPAN 12 MG PO CAPS
12.0000 mg | ORAL_CAPSULE | ORAL | Status: AC
  Administered 2024-01-09: 12 mg via ORAL
  Filled 2024-01-09: qty 1

## 2024-01-09 MED ORDER — DROPERIDOL 2.5 MG/ML IJ SOLN: 0.6250 mg | Freq: Once | INTRAMUSCULAR | Status: DC | PRN

## 2024-01-09 MED ORDER — SODIUM CHLORIDE 0.9 % IV SOLN
2.0000 g | INTRAVENOUS | Status: AC
  Administered 2024-01-09: 2 g via INTRAVENOUS
  Filled 2024-01-09: qty 2

## 2024-01-09 MED ORDER — PHENOL 1.4 % MT LIQD: 2.0000 | OROMUCOSAL | Status: DC | PRN

## 2024-01-09 MED ORDER — SODIUM CHLORIDE 0.9 % IR SOLN
Status: DC | PRN
  Administered 2024-01-09: 3000 mL

## 2024-01-09 MED ORDER — SODIUM CHLORIDE 0.9% FLUSH
3.0000 mL | Freq: Two times a day (BID) | INTRAVENOUS | Status: DC
  Administered 2024-01-09 – 2024-01-14 (×14): 3 mL via INTRAVENOUS

## 2024-01-09 MED ORDER — ALBUMIN HUMAN 5 % IV SOLN
12.5000 g | Freq: Four times a day (QID) | INTRAVENOUS | Status: AC | PRN
  Administered 2024-01-10: 12.5 g via INTRAVENOUS
  Filled 2024-01-09: qty 250

## 2024-01-09 MED ORDER — TAMSULOSIN HCL 0.4 MG PO CAPS
0.4000 mg | ORAL_CAPSULE | Freq: Every day | ORAL | Status: DC
  Administered 2024-01-09 – 2024-01-14 (×8): 0.4 mg via ORAL
  Filled 2024-01-09 (×6): qty 1

## 2024-01-09 MED ORDER — CALCIUM POLYCARBOPHIL 625 MG PO TABS
625.0000 mg | ORAL_TABLET | Freq: Two times a day (BID) | ORAL | Status: DC
  Administered 2024-01-09 – 2024-01-14 (×13): 625 mg via ORAL
  Filled 2024-01-09 (×10): qty 1

## 2024-01-09 MED ORDER — PHENYLEPHRINE 80 MCG/ML (10ML) SYRINGE FOR IV PUSH (FOR BLOOD PRESSURE SUPPORT)
PREFILLED_SYRINGE | INTRAVENOUS | Status: AC
  Filled 2024-01-09: qty 10

## 2024-01-09 MED ORDER — LIDOCAINE HCL (PF) 2 % IJ SOLN
INTRAMUSCULAR | Status: AC
  Filled 2024-01-09: qty 5

## 2024-01-09 MED ORDER — ONDANSETRON HCL 4 MG/2ML IJ SOLN: 4.0000 mg | Freq: Four times a day (QID) | INTRAMUSCULAR | Status: DC | PRN

## 2024-01-09 MED ORDER — PRIMIDONE 50 MG PO TABS
50.0000 mg | ORAL_TABLET | Freq: Two times a day (BID) | ORAL | Status: DC
  Administered 2024-01-09 – 2024-01-14 (×13): 50 mg via ORAL
  Filled 2024-01-09 (×11): qty 1

## 2024-01-09 MED ORDER — METHYLENE BLUE (ANTIDOTE) 1 % IV SOLN
INTRAVENOUS | Status: DC | PRN
  Administered 2024-01-09: 2 mL

## 2024-01-09 MED ORDER — FENTANYL CITRATE (PF) 250 MCG/5ML IJ SOLN
INTRAMUSCULAR | Status: DC | PRN
  Administered 2024-01-09: 100 ug via INTRAVENOUS
  Administered 2024-01-09 (×2): 50 ug via INTRAVENOUS
  Administered 2024-01-09: 100 ug via INTRAVENOUS
  Administered 2024-01-09 (×2): 50 ug via INTRAVENOUS

## 2024-01-09 MED ORDER — MELATONIN 3 MG PO TABS: 3.0000 mg | ORAL_TABLET | Freq: Every evening | ORAL | Status: DC | PRN

## 2024-01-09 MED ORDER — SODIUM CHLORIDE 0.9 % IV SOLN
2.0000 g | Freq: Two times a day (BID) | INTRAVENOUS | Status: AC
  Administered 2024-01-09: 2 g via INTRAVENOUS
  Filled 2024-01-09: qty 2

## 2024-01-09 MED ORDER — INSULIN ASPART 100 UNIT/ML IJ SOLN
0.0000 [IU] | Freq: Three times a day (TID) | INTRAMUSCULAR | Status: DC
  Administered 2024-01-09 – 2024-01-10 (×2): 3 [IU] via SUBCUTANEOUS
  Administered 2024-01-10 – 2024-01-13 (×5): 2 [IU] via SUBCUTANEOUS
  Administered 2024-01-13 (×2): 3 [IU] via SUBCUTANEOUS
  Administered 2024-01-13 – 2024-01-14 (×3): 2 [IU] via SUBCUTANEOUS

## 2024-01-09 MED ORDER — LACTATED RINGERS IR SOLN
Status: DC | PRN
  Administered 2024-01-09: 2000 mL

## 2024-01-09 MED ORDER — ONDANSETRON HCL 4 MG/2ML IJ SOLN
INTRAMUSCULAR | Status: DC | PRN
  Administered 2024-01-09: 4 mg via INTRAVENOUS

## 2024-01-09 MED ORDER — ACETAMINOPHEN 500 MG PO TABS
1000.0000 mg | ORAL_TABLET | Freq: Four times a day (QID) | ORAL | Status: DC
  Administered 2024-01-09 – 2024-01-14 (×22): 1000 mg via ORAL
  Filled 2024-01-09 (×19): qty 2

## 2024-01-09 MED ORDER — FENTANYL CITRATE (PF) 250 MCG/5ML IJ SOLN
INTRAMUSCULAR | Status: AC
  Filled 2024-01-09: qty 5

## 2024-01-09 MED ORDER — PHENYLEPHRINE 80 MCG/ML (10ML) SYRINGE FOR IV PUSH (FOR BLOOD PRESSURE SUPPORT)
PREFILLED_SYRINGE | INTRAVENOUS | Status: DC | PRN
  Administered 2024-01-09: 160 ug via INTRAVENOUS
  Administered 2024-01-09: 240 ug via INTRAVENOUS
  Administered 2024-01-09: 40 ug via INTRAVENOUS

## 2024-01-09 MED ORDER — BUSPIRONE HCL 5 MG PO TABS
10.0000 mg | ORAL_TABLET | Freq: Three times a day (TID) | ORAL | Status: DC
  Administered 2024-01-09 – 2024-01-14 (×21): 10 mg via ORAL
  Filled 2024-01-09: qty 2
  Filled 2024-01-09: qty 1
  Filled 2024-01-09 (×6): qty 2
  Filled 2024-01-09: qty 1
  Filled 2024-01-09 (×4): qty 2
  Filled 2024-01-09: qty 1
  Filled 2024-01-09 (×2): qty 2

## 2024-01-09 MED ORDER — BUPIVACAINE-EPINEPHRINE (PF) 0.25% -1:200000 IJ SOLN
INTRAMUSCULAR | Status: AC
  Filled 2024-01-09: qty 60

## 2024-01-09 MED ORDER — ORAL CARE MOUTH RINSE: 15.0000 mL | OROMUCOSAL | Status: DC | PRN

## 2024-01-09 MED ORDER — HYDROCODONE-ACETAMINOPHEN 5-325 MG PO TABS: 1.0000 | ORAL_TABLET | Freq: Four times a day (QID) | ORAL | 0 refills | Status: DC | PRN

## 2024-01-09 MED ORDER — LACTATED RINGERS IV SOLN: INTRAVENOUS | Status: DC | PRN

## 2024-01-09 MED ORDER — HYDROMORPHONE HCL 1 MG/ML IJ SOLN: 0.2500 mg | INTRAMUSCULAR | Status: DC | PRN

## 2024-01-09 SURGICAL SUPPLY — 97 items
BAG COUNTER SPONGE SURGICOUNT (BAG) ×2 IMPLANT
BAG URO CATCHER STRL LF (MISCELLANEOUS) ×2 IMPLANT
BLADE EXTENDED COATED 6.5IN (ELECTRODE) IMPLANT
CANNULA REDUCER 12-8 DVNC XI (CANNULA) IMPLANT
CATH URETL OPEN 5X70 (CATHETERS) IMPLANT
CELLS DAT CNTRL 66122 CELL SVR (MISCELLANEOUS) IMPLANT
CHLORAPREP W/TINT 26 (MISCELLANEOUS) IMPLANT
CLIP APPLIE 5 13 M/L LIGAMAX5 (MISCELLANEOUS) IMPLANT
CLIP APPLIE ROT 10 11.4 M/L (STAPLE) IMPLANT
CLOTH BEACON ORANGE TIMEOUT ST (SAFETY) ×2 IMPLANT
COVER SURGICAL LIGHT HANDLE (MISCELLANEOUS) ×4 IMPLANT
COVER TIP SHEARS 8 DVNC (MISCELLANEOUS) ×2 IMPLANT
DEFOGGER SCOPE WARM SEASHARP (MISCELLANEOUS) ×2 IMPLANT
DEVICE TROCAR PUNCTURE CLOSURE (ENDOMECHANICALS) IMPLANT
DRAIN CHANNEL 19F RND (DRAIN) IMPLANT
DRAIN CHANNEL JP 19F 1/4 (MISCELLANEOUS) IMPLANT
DRAPE ARM DVNC X/XI (DISPOSABLE) ×8 IMPLANT
DRAPE COLUMN DVNC XI (DISPOSABLE) ×2 IMPLANT
DRAPE CV SPLIT W-CLR ANES SCRN (DRAPES) ×2 IMPLANT
DRAPE PERI GROIN 82X75IN TIB (DRAPES) ×2 IMPLANT
DRAPE SURG IRRIG POUCH 19X23 (DRAPES) ×2 IMPLANT
DRIVER NDL LRG 8 DVNC XI (INSTRUMENTS) ×2 IMPLANT
DRIVER NDLE LRG 8 DVNC XI (INSTRUMENTS) IMPLANT
DRSG OPSITE POSTOP 4X10 (GAUZE/BANDAGES/DRESSINGS) IMPLANT
DRSG OPSITE POSTOP 4X6 (GAUZE/BANDAGES/DRESSINGS) IMPLANT
DRSG OPSITE POSTOP 4X8 (GAUZE/BANDAGES/DRESSINGS) IMPLANT
DRSG TEGADERM 2-3/8X2-3/4 SM (GAUZE/BANDAGES/DRESSINGS) ×10 IMPLANT
DRSG TEGADERM 4X4.75 (GAUZE/BANDAGES/DRESSINGS) IMPLANT
ELECT PENCIL ROCKER SW 15FT (MISCELLANEOUS) ×2 IMPLANT
ELECT REM PT RETURN 15FT ADLT (MISCELLANEOUS) ×2 IMPLANT
ENDOLOOP SUT PDS II 0 18 (SUTURE) IMPLANT
EVACUATOR SILICONE 100CC (DRAIN) IMPLANT
FORCEPS BPLR FENES DVNC XI (FORCEP) IMPLANT
GAUZE SPONGE 2X2 8PLY STRL LF (GAUZE/BANDAGES/DRESSINGS) ×2 IMPLANT
GLOVE ECLIPSE 8.0 STRL XLNG CF (GLOVE) ×6 IMPLANT
GLOVE INDICATOR 8.0 STRL GRN (GLOVE) ×6 IMPLANT
GLOVE SURG LX STRL 7.5 STRW (GLOVE) ×2 IMPLANT
GOWN SRG XL LVL 4 BRTHBL STRL (GOWNS) ×2 IMPLANT
GOWN STRL REUS W/ TWL XL LVL3 (GOWN DISPOSABLE) ×8 IMPLANT
GRASPER SUT TROCAR 14GX15 (MISCELLANEOUS) IMPLANT
GRASPER TIP-UP FEN DVNC XI (INSTRUMENTS) ×2 IMPLANT
GUIDEWIRE ANG ZIPWIRE 038X150 (WIRE) IMPLANT
GUIDEWIRE STR DUAL SENSOR (WIRE) IMPLANT
HOLDER FOLEY CATH W/STRAP (MISCELLANEOUS) ×2 IMPLANT
IRRIGATION SUCT STRKRFLW 2 WTP (MISCELLANEOUS) ×2 IMPLANT
KIT PROCEDURE DVNC SI (MISCELLANEOUS) ×2 IMPLANT
KIT SIGMOIDOSCOPE (SET/KITS/TRAYS/PACK) IMPLANT
KIT TURNOVER KIT A (KITS) ×4 IMPLANT
MANIFOLD NEPTUNE II (INSTRUMENTS) ×2 IMPLANT
NDL INSUFFLATION 14GA 120MM (NEEDLE) ×2 IMPLANT
NDL SPNL 20GX3.5 QUINCKE YW (NEEDLE) ×2 IMPLANT
NEEDLE INSUFFLATION 14GA 120MM (NEEDLE) ×2 IMPLANT
NEEDLE SPNL 20GX3.5 QUINCKE YW (NEEDLE) ×2 IMPLANT
PACK COLON (CUSTOM PROCEDURE TRAY) ×2 IMPLANT
PACK CYSTO (CUSTOM PROCEDURE TRAY) ×2 IMPLANT
PAD POSITIONING PINK XL (MISCELLANEOUS) ×2 IMPLANT
PROTECTOR NERVE ULNAR (MISCELLANEOUS) ×4 IMPLANT
RELOAD STAPLE 45 3.5 BLU DVNC (STAPLE) IMPLANT
RELOAD STAPLE 45 4.3 GRN DVNC (STAPLE) IMPLANT
RELOAD STAPLE 60 3.5 BLU DVNC (STAPLE) IMPLANT
RELOAD STAPLE 60 4.3 GRN DVNC (STAPLE) IMPLANT
RETRACTOR WND ALEXIS 18 MED (MISCELLANEOUS) IMPLANT
SCISSORS LAP 5X35 DISP (ENDOMECHANICALS) ×2 IMPLANT
SCISSORS MNPLR CVD DVNC XI (INSTRUMENTS) ×2 IMPLANT
SEAL UNIV 5-12 XI (MISCELLANEOUS) ×8 IMPLANT
SEALER VESSEL EXT DVNC XI (MISCELLANEOUS) ×2 IMPLANT
SOLUTION ELECTROSURG ANTI STCK (MISCELLANEOUS) ×2 IMPLANT
SPIKE FLUID TRANSFER (MISCELLANEOUS) ×2 IMPLANT
STAPLER 45 SUREFORM DVNC (STAPLE) IMPLANT
STAPLER 60 SUREFORM DVNC (STAPLE) IMPLANT
STAPLER ECHELON POWER CIR 29 (STAPLE) IMPLANT
STAPLER ECHELON POWER CIR 31 (STAPLE) IMPLANT
STOPCOCK 4 WAY LG BORE MALE ST (IV SETS) ×4 IMPLANT
SURGILUBE 2OZ TUBE FLIPTOP (MISCELLANEOUS) IMPLANT
SUT MNCRL AB 4-0 PS2 18 (SUTURE) ×2 IMPLANT
SUT PDS AB 1 CT1 27 (SUTURE) ×4 IMPLANT
SUT PROLENE 0 CT 2 (SUTURE) IMPLANT
SUT PROLENE 2 0 KS (SUTURE) IMPLANT
SUT PROLENE 2 0 SH DA (SUTURE) IMPLANT
SUT SILK 2 0 SH CR/8 (SUTURE) IMPLANT
SUT SILK 2-0 18XBRD TIE 12 (SUTURE) IMPLANT
SUT SILK 3 0 SH CR/8 (SUTURE) ×2 IMPLANT
SUT SILK 3-0 18XBRD TIE 12 (SUTURE) IMPLANT
SUT VIC AB 2-0 SH 18 (SUTURE) ×2 IMPLANT
SUT VIC AB 3-0 SH 18 (SUTURE) IMPLANT
SUT VIC AB 3-0 SH 27XBRD (SUTURE) IMPLANT
SUT VICRYL 0 UR6 27IN ABS (SUTURE) IMPLANT
SUTURE V-LC BRB 180 2/0GR6GS22 (SUTURE) IMPLANT
SYR 20ML ECCENTRIC (SYRINGE) ×2 IMPLANT
SYSTEM LAPSCP GELPORT 120MM (MISCELLANEOUS) IMPLANT
SYSTEM WOUND ALEXIS 18CM MED (MISCELLANEOUS) ×2 IMPLANT
TAPE UMBILICAL 1/8 X36 TWILL (MISCELLANEOUS) ×2 IMPLANT
TRAY FOLEY MTR SLVR 16FR STAT (SET/KITS/TRAYS/PACK) ×2 IMPLANT
TROCAR ADV FIXATION 5X100MM (TROCAR) ×2 IMPLANT
TUBING CONNECTING 10 (TUBING) ×6 IMPLANT
TUBING INSUFFLATION 10FT LAP (TUBING) ×2 IMPLANT
TUBING UROLOGY SET (TUBING) IMPLANT

## 2024-01-09 NOTE — Transfer of Care (Signed)
 Immediate Anesthesia Transfer of Care Note  Patient: Calvin Escobar  Procedure(s) Performed: COLECTOMY, SIGMOID, ROBOT-ASSISTED AND DRAINAGE OF PELVIC ABSCESS CREATION OF DIVERTING LOOP ILEOSTOMY SIGMOIDOSCOPY, FLEXIBLE CYSTOSCOPY WITH INDOCYANINE GREEN  IMAGING (ICG)  Patient Location: PACU  Anesthesia Type:General  Level of Consciousness: awake and patient cooperative  Airway & Oxygen Therapy: Patient Spontanous Breathing and Patient connected to face mask oxygen  Post-op Assessment: Report given to RN and Post -op Vital signs reviewed and stable  Post vital signs: Reviewed and stable  Last Vitals:  Vitals Value Taken Time  BP 123/71 01/09/24 12:31  Temp    Pulse 79 01/09/24 12:33  Resp 18 01/09/24 12:33  SpO2 96 % 01/09/24 12:33  Vitals shown include unfiled device data.  Last Pain:  Vitals:   01/09/24 0653  TempSrc:   PainSc: 0-No pain         Complications: No notable events documented.

## 2024-01-09 NOTE — Interval H&P Note (Signed)
 History and Physical Interval Note:  01/09/2024 7:03 AM  Calvin Escobar  has presented today for surgery, with the diagnosis of COLOVESICAL FISTULA.  The various methods of treatment have been discussed with the patient and family. After consideration of risks, benefits and other options for treatment, the patient has consented to  Procedure(s) with comments: COLECTOMY, SIGMOID, ROBOT-ASSISTED (N/A) - ROBOTIC RESECTION OF COLON RECTOSIGMOID CREATION, COLOSTOMY, LOOP, LAPAROSCOPIC (N/A) - POSSIBLE BLADDER REPAIR, AND POSSIBLE OSTOMY SIGMOIDOSCOPY, FLEXIBLE (N/A) CYSTOSCOPY WITH INDOCYANINE GREEN  IMAGING (ICG) (N/A) - CYSTO WITH FIREFLY INJECTION as a surgical intervention.  The patient's history has been reviewed, patient examined, no change in status, stable for surgery.  I have reviewed the patient's chart and labs.  Questions were answered to the patient's satisfaction.    I have re-reviewed the the patient's records, history, medications, and allergies.  I have re-examined the patient.  I again discussed intraoperative plans and goals of post-operative recovery.  The patient agrees to proceed.  Calvin Escobar [facility]  Nov 02, 1950 [id number]  Patient Care Team: [name], MD as PCP - General (Internal Medicine) [name], MD as PCP - Cardiology (Cardiology) [name], MD (Dermatology) [facility] (Behavioral Health) Ezzard Rolin BIRCH, LCSW as VBCI Care Management (Licensed Clinical Social Worker) [name] (Psychiatry) [name], [name] LABOR, MD (Psychiatry) Legrand Victory LITTIE MOULD, MD as Consulting Physician (Gastroenterology) Sheldon Standing, MD as Consulting Physician (Colon and Rectal Surgery) Ezzard Rolin BIRCH KEN (Licensed Clinical Social Worker) Carolee Sherwood BIRCH MOULD, MD as Consulting Physician (Urology) Daneen Damien BROCKS, NP as Nurse Practitioner (Cardiology)  Patient Active Problem List   Diagnosis Date Noted   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     Priority: Medium    Dementia [facility])     Priority: Medium    Bipolar affective disorder, depressed (HCC) 05/10/2007    Priority: Medium    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   Chronic HFrEF (heart failure with reduced ejection fraction) (HCC) 12/13/2023   Hematuria 12/02/2023   Medication management 11/05/2023   Hydronephrosis of left kidney 09/28/2023   Prostatic mass 09/28/2023   Weight loss, abnormal 08/14/2023   Hypocalcemia 08/14/2023   Hypoalbuminemia 08/14/2023   GERD (gastroesophageal reflux disease) 07/13/2023   Recurrent UTI 07/01/2023   LBBB (left bundle branch block) 06/02/2023   Obesity (BMI 30-39.9) 05/13/2023   Tremor 05/08/2023   Lack of appetite 05/08/2023   Chronic kidney disease, stage 3a (HCC) 03/07/2023   Anemia of chronic disease 03/07/2023   Limp 12/19/2022   Gynecomastia, male 12/19/2022   Chronic back pain 12/19/2022   Hypertension 11/21/2022   Generalized arthritis 07/18/2022   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   HFrEF (heart failure with reduced ejection fraction) (HCC) 07/30/2019   DCM (dilated cardiomyopathy) (HCC)    OSA treated with BiPAP    Primary insomnia 08/05/2017   HLD (hyperlipidemia)    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   BPH (benign prostatic hyperplasia) 03/27/2012   Morbid obesity (HCC) 03/27/2012   Chronic bronchitis (HCC) 05/10/2007    Past Medical History:  Diagnosis Date   Acute cystitis 05/13/2023   Acute hyponatremia 05/13/2023   Acute kidney injury superimposed on chronic kidney disease (HCC) 05/13/2023   CLINICAL CONTEXT: 73 year old male with stage 3 CKD, colovesical fistula, recurrent UTIs, and recent candidal UTI. Recent renal ultrasound (09/28/2023) confirmed mild left hydronephrosis and prostatic mass protruding into bladder base. Iron  studies consistent with anemia of chronic disease (iron  32, TIBC 238, ferritin 420).  DIAGNOSTIC ASSESSMENT: ? Worsening renal function with significant incre   Acute  metabolic encephalopathy 07/12/2023   Acute respiratory failure with hypoxia (HCC) 06/02/2023   AKI (acute kidney injury) (HCC) 05/13/2023   Allergy seasonal   Anxiety 12/02/2023   Ascending aorta dilatation (HCC)    38 mm by 2D echo 04/2021   C. 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 History of GI bleeding (07/2023). Currently on ferrous sulfate  supplementation.   DIAGNOSTIC ASSESSMENT: ? Primary etiology (70-80% probability): Anemia of chronic disease/inflammation ? 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  EF 35 to 40% on echo 04/2021   Dementia (HCC)    Depression    Diarrhea 05/13/2023   E coli bacteremia 05/14/2023   High anion gap metabolic acidosis 07/13/2023   History of Clostridioides difficile colitis 08/15/2023   Associated with colovesical fistula? Recurrent antibiotic(s) requirement     Hyperkalemia    Associated with losartan   Advised patient low potassium diet 08/2022             Lab Results      Component    Value    Date/Time           K    4.0    12/27/2023 01:20 PM           K    4.4    12/18/2023 05:07 AM           K    4.4    12/17/2023 06:01 AM           K    4.7    12/16/2023 06:15 AM           K    4.9    12/15/2023 08:21 AM           K    4.4    12/14/2023 06:00 AM           K    5.0    Hyperlipidemia    Hypertension    Leukocytosis 12/11/2023   Lab Results      Component    Value    Date/Time           WBC    6.2     12/27/2023 01:20 PM           WBC    11.6 (H)    12/18/2023 05:07 AM           WBC    9.6    12/17/2023 06:01 AM           WBC    9.4    12/16/2023 06:15 AM           WBC    12.7 (H)    12/15/2023 08:21 AM           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  Work-up with cardiology in January  2021.     catheterization    Assessment:     1. Normal coronaries  2. NICM with EF 25-30%  3. Normal hemodynamics  .     Pneumonia    PUD (peptic ulcer disease) 01/12/2015   Formatting of this note might be different from the original.  Endoscopy esophageal stricture 2016     Septic shock from UTI 06/02/2023   Severe sepsis (HCC) 06/03/2023   SIRS (systemic inflammatory response syndrome) (HCC) 12/11/2023   SKIN CANCER, HX OF 05/10/2007   Facial left check and forehead and r shoulder  F/w derm   Syncope 05/13/2023   Thrombocytopenia (HCC) 06/02/2023   Lab Results      Component    Value    Date           PLT    301    07/19/2023           PLT    324    07/18/2023           PLT    268    07/16/2023           PLT    207    07/14/2023           PLT    228    07/13/2023             Lab Results      Component    Value    Date           ESRSEDRATE    60 (H)    05/08/2023     No results found for: CRP          Lab Results      Component    Value       Urinary catheter complication (HCC) 06/24/2023   Urinary dribbling 03/14/2022   Urinary incontinence 11/21/2022      Urinary Incontinence: They are wearing disposable diapers daily due to urinary incontinence, primarily nocturnal, and are currently on Tamsulosin  (Flomax ). We will refer them to Urology for further evaluation and potential treatment options and continue Tamsulosin  until they are seen by Urology.     Urinary tract infection without hematuria 06/02/2023   UTI (urinary tract infection) 07/01/2023    Past Surgical History:   Procedure Laterality Date   COLONOSCOPY     COLONOSCOPY N/A 12/05/2023   Procedure: COLONOSCOPY;  Surgeon: Leigh Elspeth SQUIBB, MD;  Location: [facility] ENDOSCOPY;  Service: Gastroenterology;  Laterality: N/A;   RIGHT/LEFT HEART CATH AND CORONARY ANGIOGRAPHY N/A 07/03/2019   Procedure: RIGHT/LEFT HEART CATH AND CORONARY ANGIOGRAPHY;  Surgeon: Cherrie Toribio SAUNDERS, MD;  Location: [facility] INVASIVE CV LAB;  Service: Cardiovascular;  Laterality: N/A;    Social History   Socioeconomic History   Marital status: Married    Spouse name: Not on file   Number of children: Not on file   Years of education: Not on file   Highest education level: Not on file  Occupational History   Not on file  Tobacco Use   Smoking status: Never   Smokeless tobacco: Never  Vaping Use   Vaping status: Never Used  Substance and Sexual Activity   Alcohol use: Not Currently   Drug use: No   Sexual activity: Not Currently  Other Topics Concern   Not on file  Social History Narrative   Not on file   Social Drivers of Health   Financial Resource Strain: Low Risk  (10/10/2023)   Overall  Financial Resource Strain (CARDIA)    Difficulty of Paying Living Expenses: Not hard at all  Food Insecurity: No Food Insecurity (12/11/2023)   Hunger Vital Sign    Worried About Running Out of Food in the Last Year: Never true    Ran Out of Food in the Last Year: Never true  Transportation Needs: No Transportation Needs (12/11/2023)   PRAPARE - Administrator, Civil Service (Medical): No    Lack of Transportation (Non-Medical): No  Physical Activity: Sufficiently Active (10/10/2023)   Exercise Vital Sign    Days of Exercise per Week: 5 days    Minutes of Exercise per Session: 30 min  Stress: No Stress Concern Present (10/10/2023)   [employer] of Occupational Health - Occupational Stress Questionnaire    Feeling of Stress : Not at all  Social Connections: Socially Isolated (12/11/2023)   Social Connection and Isolation Panel     Frequency of Communication with Friends and Family: Never    Frequency of Social Gatherings with Friends and Family: Never    Attends Religious Services: Never    Database administrator or Organizations: No    Attends Banker Meetings: Never    Marital Status: Married  Catering manager Violence: Not At Risk (12/11/2023)   Humiliation, Afraid, Rape, and Kick questionnaire    Fear of Current or Ex-Partner: No    Emotionally Abused: No    Physically Abused: No    Sexually Abused: No    Family History  Problem Relation Age of Onset   Hypertension Mother    Cancer Father    Bone cancer Sister     Medications Prior to Admission  Medication Sig Dispense Refill Last Dose/Taking   folic acid  (FOLVITE ) 1 MG tablet Take 1 mg by mouth daily.   Past Week   furosemide  (LASIX ) 40 MG tablet Take 1 tablet (40 mg total) by mouth daily as needed for fluid or edema (For weight gain more than 2 pounds from your baseline over 24 hours). 30 tablet 0 Past Week   melatonin 3 MG TABS tablet Take 1 tablet (3 mg total) by mouth at bedtime as needed (insomnia).   Past Week   ARIPiprazole  (ABILIFY ) 2 MG tablet Take 1 tablet (2 mg total) by mouth in the morning. 30 tablet 0    busPIRone  (BUSPAR ) 10 MG tablet Take 1 tablet (10 mg total) by mouth 3 (three) times daily. 270 tablet 3    carvedilol  (COREG ) 6.25 MG tablet Take 1 tablet (6.25 mg total) by mouth 2 (two) times daily. 60 tablet 0    docusate sodium  (COLACE) 100 MG capsule Take 2 capsules (200 mg total) by mouth 2 (two) times daily as needed for mild constipation. 20 capsule 0    finasteride  (PROSCAR ) 5 MG tablet Take 5 mg by mouth daily.      gabapentin  (NEURONTIN ) 100 MG capsule TAKE 1 CAPSULE (100 MG TOTAL) BY MOUTH THREE TIMES DAILY. 90 capsule 3    mirtazapine  (REMERON ) 30 MG tablet Take 1 tablet (30 mg total) by mouth at bedtime. 90 tablet 3    pravastatin  (PRAVACHOL ) 20 MG tablet Take 1 tablet (20 mg total) by mouth at bedtime. 90  tablet 3    primidone  (MYSOLINE ) 50 MG tablet Take 1 tablet (50 mg total) by mouth 2 (two) times daily.      tamsulosin  (FLOMAX ) 0.4 MG CAPS capsule TAKE 1 CAPSULE BY MOUTH EVERY DAY 90 capsule 1    traZODone  (  DESYREL ) 100 MG tablet Take 1 tablet (100 mg total) by mouth at bedtime.      venlafaxine  XR (EFFEXOR -XR) 150 MG 24 hr capsule Take 150 mg by mouth daily.      venlafaxine  XR (EFFEXOR -XR) 75 MG 24 hr capsule Take 75 mg by mouth daily.       Current Facility-Administered Medications  Medication Dose Route Frequency Provider Last Rate Last Admin   acetaminophen  (TYLENOL ) tablet 1,000 mg  1,000 mg Oral On Call to OR Sheldon Standing, MD       alvimopan  (ENTEREG ) capsule 12 mg  12 mg Oral On Call to OR Sheldon Standing, MD       cefoTEtan  (CEFOTAN ) 2 g in sodium chloride  0.9 % 100 mL IVPB  2 g Intravenous On Call to OR Sheldon Standing, MD       chlorhexidine  (PERIDEX ) 0.12 % solution 15 mL  15 mL Mouth/Throat Once Howze, Kathryn E, MD       Or   Oral care mouth rinse  15 mL Mouth Rinse Once Howze, Kathryn E, MD       enoxaparin  (LOVENOX ) injection 40 mg  40 mg Subcutaneous Once Sheldon Standing, MD       gabapentin  (NEURONTIN ) capsule 200 mg  200 mg Oral On Call to OR Sheldon Standing, MD       lactated ringers  infusion   Intravenous Continuous Howze, Kathryn E, MD       phenylephrine  (NEOSYNEPHRINE) 20-0.9 MG/250ML-% infusion              Allergies  Allergen Reactions   Albuterol  Palpitations    Supraventricular Tachycardia     Ht 6' (1.829 m)   Wt 103.8 kg   BMI 31.03 kg/m   Labs: No results found for this or any previous visit (from the past 48 hours).  Imaging / Studies: VAS US  LOWER EXTREMITY VENOUS (DVT) Result Date: 12/15/2023  Lower Venous DVT Study Patient Name:  RIHAAN BARRACK [facility]  Date of Exam:   12/14/2023 Medical Rec #: [id number]           Accession #:    [id number] Date of Birth: [birth year]           Patient Gender: M Patient Age:   83 years Exam Location:  [facility]  Procedure:      VAS US  LOWER EXTREMITY VENOUS (DVT) Referring Phys: DONALDA APPLEBAUM --------------------------------------------------------------------------------  Indications: Swelling, SOB, and Covid.  Risk Factors: HFrEF, NICM, CHF, history of candidemia during prior admission 12/02/23-12/07/2023, readmission for fever, tachycardia and new Ecoli and Covid infection. AKI, CKD IIIa. Limitations: Poor ultrasound/tissue interface and Patient sleeping with restless legs. Comparison Study: No prior study on file Performing Technologist: Alberta Lis RVS  Examination Guidelines: A complete evaluation includes B-mode imaging, spectral Doppler, color Doppler, and power Doppler as needed of all accessible portions of each vessel. Bilateral testing is considered an integral part of a complete examination. Limited examinations for reoccurring indications may be performed as noted. The reflux portion of the exam is performed with the patient in reverse Trendelenburg.  +---------+---------------+---------+-----------+----------+--------------+ RIGHT    CompressibilityPhasicitySpontaneityPropertiesThrombus Aging +---------+---------------+---------+-----------+----------+--------------+ CFV      Full           Yes      No                                  +---------+---------------+---------+-----------+----------+--------------+ SFJ  Full                                                        +---------+---------------+---------+-----------+----------+--------------+ FV Prox  Full           Yes      No                                  +---------+---------------+---------+-----------+----------+--------------+ FV Mid   Full           Yes      No                                  +---------+---------------+---------+-----------+----------+--------------+ FV DistalFull           Yes      No                                   +---------+---------------+---------+-----------+----------+--------------+ PFV      Full           Yes      No                                  +---------+---------------+---------+-----------+----------+--------------+ POP      Full           Yes      No                                  +---------+---------------+---------+-----------+----------+--------------+ PTV      Full                                                        +---------+---------------+---------+-----------+----------+--------------+ PERO     Full                                                        +---------+---------------+---------+-----------+----------+--------------+ Gastroc  Full                                                        +---------+---------------+---------+-----------+----------+--------------+ Large cystic structure containing mixed echoes noted in the popliteal fossa measuring 4.35 x 2.81 cm.  +---------+---------------+---------+-----------+----------+--------------+ LEFT     CompressibilityPhasicitySpontaneityPropertiesThrombus Aging +---------+---------------+---------+-----------+----------+--------------+ CFV      Full           Yes      No                                  +---------+---------------+---------+-----------+----------+--------------+  SFJ      Full                                                        +---------+---------------+---------+-----------+----------+--------------+ FV Prox  Full           Yes      No                                  +---------+---------------+---------+-----------+----------+--------------+ FV Mid   Full                                                        +---------+---------------+---------+-----------+----------+--------------+ FV DistalFull           Yes      No                                  +---------+---------------+---------+-----------+----------+--------------+ PFV      Full                                                         +---------+---------------+---------+-----------+----------+--------------+ POP      Full           Yes      No                                  +---------+---------------+---------+-----------+----------+--------------+ PTV      Full                                                        +---------+---------------+---------+-----------+----------+--------------+ PERO     Full                                                        +---------+---------------+---------+-----------+----------+--------------+     Summary: BILATERAL: - No obvious evidence of deep 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 - RIGHT: - Large cystic structure containing mixed echoes noted in the popliteal fossa measuring 4.35 x 2.81 cm.   *See table(s) above for measurements and observations. Electronically signed by Gaile New MD on 12/15/2023 at 2:05:48 PM.    Final    DG Chest Port 1 View Result Date: 12/14/2023 CLINICAL DATA:  Shortness of breath EXAM: PORTABLE CHEST 1 VIEW COMPARISON:  12/12/2023 FINDINGS: 0.6 cm right mid lung nodule no change from 2017, considered benign. Low lung volumes are present, causing crowding of the pulmonary vasculature. Lower thoracic spondylosis. New hazy density peripherally at the  left lung base may represent superimposition of osseous shadows, although early pneumonia or atelectasis could have a similar appearance. This could be in the lingula or left lower lobe. Degenerative glenohumeral arthropathy, left greater than right. IMPRESSION: 1. New hazy density peripherally at the left lung base may represent superimposition of osseous shadows, although early pneumonia or atelectasis could have a similar appearance. This could be in the lingula or left lower lobe. 2. Low lung volumes are present, causing crowding of the pulmonary vasculature. 3. Lower thoracic spondylosis. 4. Degenerative glenohumeral arthropathy,  left greater than right. Electronically Signed   By: Ryan Salvage M.D.   On: 12/14/2023 10:37   ECHOCARDIOGRAM COMPLETE Result Date: 12/13/2023    ECHOCARDIOGRAM REPORT   Patient Name:   ANDRUS SHARP [facility] Date of Exam: 12/13/2023 Medical Rec #:  [id number]          Height:       72.0 in Accession #:    [id number]         Weight:       238.1 lb Date of Birth:  [birth year]          BSA:          2.294 m Patient Age:    73 years           BP:           91/60 mmHg Patient Gender: M                  HR:           63 bpm. Exam Location:  Inpatient Procedure: 2D Echo, Color Doppler and Cardiac Doppler (Both Spectral and Color            Flow Doppler were utilized during procedure). Indications:    Dyspnea  History:        Patient has prior history of Echocardiogram examinations, most                 recent 05/23/2023. Risk Factors:HDL.  Sonographer:    Benard Stallion Referring Phys: [id number] DONALDA M GHIMIRE IMPRESSIONS  1. Left ventricular ejection fraction, by estimation, is 25 to 30%. The left ventricle has severely decreased function. The left ventricle demonstrates global hypokinesis. There is mild left ventricular hypertrophy. Left ventricular diastolic parameters  are indeterminate.  2. Right ventricular systolic function is normal. The right ventricular size is normal. There is mildly elevated pulmonary artery systolic pressure. The estimated right ventricular systolic pressure is 43.9 mmHg.  3. The mitral valve is normal in structure. Mild to moderate mitral valve regurgitation.  4. The aortic valve is tricuspid. Aortic valve regurgitation is not visualized. No aortic stenosis is present.  5. Aortic dilatation noted. There is dilatation of the ascending aorta, measuring 41 mm.  6. The inferior vena cava is dilated in size with <50% respiratory variability, suggesting right atrial pressure of 15 mmHg. FINDINGS  Left Ventricle: Left ventricular ejection fraction, by estimation, is 25 to 30%. The left  ventricle has severely decreased function. The left ventricle demonstrates global hypokinesis. The left ventricular internal cavity size was normal in size. There is mild left ventricular hypertrophy. Left ventricular diastolic parameters are indeterminate. Right Ventricle: The right ventricular size is normal. No increase in right ventricular wall thickness. Right ventricular systolic function is normal. There is mildly elevated pulmonary artery systolic pressure. The tricuspid regurgitant velocity is 2.69  m/s, and with an assumed right atrial pressure of 15 mmHg, the estimated right ventricular  systolic pressure is 43.9 mmHg. Left Atrium: Left atrial size was normal in size. Right Atrium: Right atrial size was normal in size. Pericardium: There is no evidence of pericardial effusion. Mitral Valve: The mitral valve is normal in structure. Mild to moderate mitral valve regurgitation. Tricuspid Valve: The tricuspid valve is normal in structure. Tricuspid valve regurgitation is mild. Aortic Valve: The aortic valve is tricuspid. Aortic valve regurgitation is not visualized. No aortic stenosis is present. Aortic valve mean gradient measures 2.0 mmHg. Aortic valve peak gradient measures 3.6 mmHg. Aortic valve area, by VTI measures 3.67 cm. Pulmonic Valve: The pulmonic valve was not well visualized. Pulmonic valve regurgitation is trivial. Aorta: The aortic root is normal in size and structure and aortic dilatation noted. There is dilatation of the ascending aorta, measuring 41 mm. Venous: The inferior vena cava is dilated in size with less than 50% respiratory variability, suggesting right atrial pressure of 15 mmHg. IAS/Shunts: The interatrial septum was not well visualized.  LEFT VENTRICLE PLAX 2D LVIDd:         5.40 cm   Diastology LVIDs:         4.90 cm   LV e' medial:  5.33 cm/s LV PW:         1.10 cm   LV e' lateral: 7.40 cm/s LV IVS:        1.10 cm LVOT diam:     2.20 cm LV SV:         76 LV SV Index:   33 LVOT  Area:     3.80 cm  RIGHT VENTRICLE RV Basal diam:  4.10 cm RV Mid diam:    3.90 cm RV S prime:     10.40 cm/s TAPSE (M-mode): 2.2 cm LEFT ATRIUM           Index        RIGHT ATRIUM           Index LA diam:      4.50 cm 1.96 cm/m   RA Area:     22.80 cm LA Vol (A4C): 59.7 ml 26.02 ml/m  RA Volume:   64.10 ml  27.94 ml/m  AORTIC VALVE AV Area (Vmax):    3.60 cm AV Area (Vmean):   3.41 cm AV Area (VTI):     3.67 cm AV Vmax:           94.50 cm/s AV Vmean:          65.900 cm/s AV VTI:            0.208 m AV Peak Grad:      3.6 mmHg AV Mean Grad:      2.0 mmHg LVOT Vmax:         89.60 cm/s LVOT Vmean:        59.200 cm/s LVOT VTI:          0.201 m LVOT/AV VTI ratio: 0.97  AORTA Ao Root diam: 3.50 cm Ao Asc diam:  4.10 cm TRICUSPID VALVE TR Peak grad:   28.9 mmHg TR Vmax:        269.00 cm/s  SHUNTS Systemic VTI:  0.20 m Systemic Diam: 2.20 cm Lonni Nanas MD Electronically signed by Lonni Nanas MD Signature Date/Time: 12/13/2023/11:00:03 AM    Final    DG Chest Port 1 View Result Date: 12/12/2023 CLINICAL DATA:  Shortness of breath. EXAM: PORTABLE CHEST 1 VIEW COMPARISON:  Radiographs 12/12/2023 and 12/11/2023.  CT 03/16/2008. FINDINGS: 1648 hours. There are lower lung volumes.  The heart size and mediastinal contours are stable. Increased patchy opacities at both lung bases, likely atelectasis. No confluent airspace disease, definite edema, pneumothorax or significant pleural effusion. The bones appear unchanged. IMPRESSION: Lower lung volumes with increased patchy opacities at both lung bases, likely atelectasis. Electronically Signed   By: Elsie Perone M.D.   On: 12/12/2023 16:59   DG Chest Port 1V same Day Result Date: 12/12/2023 CLINICAL DATA:  Shortness of breath EXAM: PORTABLE CHEST 1 VIEW COMPARISON:  12/11/2023 FINDINGS: Single frontal view of the chest demonstrates borderline enlargement the cardiac silhouette. No airspace disease, effusion, or pneumothorax. No acute bony  abnormalities. IMPRESSION: 1. Borderline enlargement of the cardiac silhouette. 2. No acute airspace disease. Electronically Signed   By: Ozell Daring M.D.   On: 12/12/2023 09:32   DG CHEST PORT 1 VIEW Result Date: 12/11/2023 CLINICAL DATA:  [id number] SOB (shortness of breath) [id number] EXAM: PORTABLE CHEST 1 VIEW COMPARISON:  AP radiograph the chest dated December 11, 2023. FINDINGS: The heart is normal in size. Pulmonary vasculature is unremarkable. There is a calcified granuloma in the right midlung zone. The lungs appear clear otherwise. IMPRESSION: No active disease. Electronically Signed   By: Evalene Coho M.D.   On: 12/11/2023 21:04   DG Chest Port 1 View Result Date: 12/11/2023 CLINICAL DATA:  Questionable sepsis - evaluate for abnormality. Tachypnea. EXAM: PORTABLE CHEST 1 VIEW COMPARISON:  07/12/2023. FINDINGS: Stable calcified granuloma overlying the right lower lung zone, laterally. Bilateral lung fields are clear. No acute consolidation or lung collapse. Bilateral costophrenic angles are clear. Stable cardio-mediastinal silhouette. No acute osseous abnormalities. The soft tissues are within normal limits. IMPRESSION: No active disease. Electronically Signed   By: Ree Molt M.D.   On: 12/11/2023 13:08     .Elspeth KYM Schultze, M.D., F.A.C.S. Gastrointestinal and Minimally Invasive Surgery [facility] [address], [address] [place], [place] [address] [phone] Main / Paging  01/09/2024 7:03 AM    Elspeth JAYSON Schultze

## 2024-01-09 NOTE — Op Note (Addendum)
 01/09/2024  12:25 PM  PATIENT:  Calvin Escobar  73 y.o. male  Patient Care Team: [name], MD as PCP - General (Internal Medicine) [name], MD as PCP - Cardiology (Cardiology) [name], MD (Dermatology) [facility] (Behavioral Health) Ezzard Rolin BIRCH, LCSW as VBCI Care Management (Licensed Clinical Social Worker) [name] (Psychiatry) [name], [name] LABOR, MD (Psychiatry) Legrand Victory [name], MD as Consulting Physician (Gastroenterology) Sheldon Standing, MD as Consulting Physician (Colon and Rectal Surgery) Ezzard Rolin BIRCH KEN (Licensed Clinical Social Worker) Carolee Sherwood BIRCH DOUGLAS, MD as Consulting Physician (Urology) Daneen Damien BROCKS, NP as Nurse Practitioner (Cardiology)  PRE-OPERATIVE DIAGNOSIS:  COLOVESICAL FISTULA RECTOSIGMOID STRICTURE  POST-OPERATIVE DIAGNOSIS:   COLOVESICAL FISTULA WITH ABSCESS RECTOSIGMOID STRICTURE  PROCEDURE:   -ROBOTIC LOW ANTERIOR RECTOSIGMOID RESECTION (LAR) WITH ANASTOMOSIS -COLOVESICAL FISTULA TAKEDOWN & REPAIR -DIVERTING LOOP ILEOSTOMY (DLI),  =DRAINAGE OF PELVIC ABSCESS -LYSIS OF ADHESIONS x45 MINUTES (30% OF CASE) -INTRAOPERATIVE ASSESSMENT OF TISSUE VASCULAR PERFUSION USING ICG (indocyanine green ) IMMUNOFLUORESCENCE -TRANSVERSUS ABDOMINIS PLANE (TAP) BLOCK - BILATERAL, and -FLEXIBLE SIGMOIDOSCOPY  SURGEON:  [name], MD  ASSISTANT:  Bernarda Ned, MD  An experienced assistant was required given the standard of surgical care given the complexity of the case.  This assistant was needed for exposure, dissection, suction, tissue approximation, retraction, perception, etc  ANESTHESIA:  General endotracheal intubation anesthesia (GETA) and Regional TRANSVERSUS ABDOMINIS PLANE (TAP) nerve block -BILATERAL for perioperative & postoperative pain control at the level of the transverse abdominis & preperitoneal spaces along the flank at the anterior axillary line, from subcostal ridge to iliac  crest under laparoscopic guidance provided with 60 mL of bupivicaine 0.25% with epinephrine   Estimated Blood Loss (EBL):   Total I/O In: 1700 [I.V.:1600; IV Piggyback:100] Out: 825 [Urine:650; Blood:175].   (See anesthesia record)  Delay start of Pharmacological VTE agent (>24hrs) due to concerns of significant anemia, surgical blood loss, or risk of bleeding?:  no  DRAINS: (None) and 19 Fr Blake drain the tip resting in the pelvis  SPECIMEN:  Rectosigmoid (open end proximal) and Distal anastomotic ring (FINAL DISTAL MARGIN)  DISPOSITION OF SPECIMEN:  Pathology  COUNTS:  Sponge, needle, & instrument counts CORRECT at the conclusion of the case.      PLAN OF CARE: Admit to inpatient   PATIENT DISPOSITION:  PACU - hemodynamically stable.  INDICATION:    73 year old male with frequent hospitalizations for urosepsis found to have colovesical fistula.  Significant bladder dysfunction in the setting of fistula as well as urethral stricture status post urethral dilatation and chronic Foley catheterization.  Multiple medical problems.  Eventually stabilized to the point to consider takedown of colovesical fistula with proper resection as well as probable diverting versus end ostomy:  The anatomy & physiology of the digestive tract was discussed.  The pathophysiology was discussed.  Natural history risks without surgery was discussed.   I worked to give an overview of the disease and the frequent need to have multispecialty involvement.  I feel the risks of no intervention will lead to serious problems that outweigh the operative risks; therefore, I recommended a partial colectomy to remove the pathology.  Laparoscopic & open techniques were discussed.   Risks such as bleeding, infection, abscess, leak, reoperation, possible ostomy, hernia, heart attack, death, and other risks were discussed.  I noted a good likelihood this will help address the problem.   Goals of post-operative recovery were  discussed as well.  We will work to  minimize complications.  Educational materials on the pathology had been given in the office.  Questions were answered.    The patient expressed understanding & wished to proceed with surgery.  OR FINDINGS:   Patient had very thickened rectosigmoid colon folded and twisted upon itself with stricturing.  Extremely dense adhesions to the left dome of the bladder with 4 x 3 x 3 cm abscess.  Abscess decompressed.  No active persistent opening of the bladder on insufflation.  No obvious metastatic disease on visceral parietal peritoneum or liver.  It is a 31mm EEA anastomosis ( distal descending colon  connected to proximal rectum.)  It rests 17 cm from the anal verge by flexible sigmoidoscopy  CASE DATA: Type of patient?: Elective [facility] Private Case Status of Case? Elective Scheduled Infection Present At Time Of Surgery (PATOS)?  ABSCESS   DESCRIPTION:   Informed consent was confirmed.  The patient underwent general anaesthesia without difficulty.  The patient was positioned appropriately.  VTE prevention in place.  Patient underwent central line and arterial line placement by anesthesia team.  Please see separate note.  Patient underwent cystoscopy with instillation of ICG firefly into both ureters and bladder by Dr. Rayen Wrenn with [facility].  Please see his separate operative report the patient was clipped, prepped, & draped in a sterile fashion.  Surgical timeout confirmed our plan.  The patient was positioned in reverse Trendelenburg.  Abdominal entry was gained using Varess technique at the left subcostal ridge on the anterior abdominal wall.  No elevated EtCO2 noted.  Port placed.  Camera inspection revealed no injury.  Extra ports were carefully placed under direct laparoscopic visualization.  Upon entering the abdomen (organ space),we encountered a phlegmon involving the sigmoid colon.   I reflected the greater omentum and the upper abdomen the  small bowel in the upper abdomen.  The patient was carefully positioned.  The [employer]uitive daVinci robot was docked with camera & instruments carefully placed.  Patient rectosigmoid colon was densely thickened to the pelvis as well as to the bladder.  I mobilized the rectosigmoid colon & elevated it to put the main pedicle on tension.  I scored the base of peritoneum of the medial side of the mesentery of the elevated left colon from the ligament of Treitz to the mid rectum.   I elevated the sigmoid mesentery and entered into the retro-mesenteric plane. We were able to identify the left ureter and gonadal vessels. We kept those posterior within the retroperitoneum and elevated the left colon mesentery off that. I did isolate the inferior mesenteric artery (IMA) pedicle but did not ligate it yet.  I continued distally and got into the avascular plane posterior to the mesorectum, sparing the nervi ergentes.. This allowed me to help mobilize the rectum as well by freeing the mesorectum off the sacrum.  I stayed away from the right and left ureters.  I kept the lateral vascular pedicles to the rectum intact.  Worked to free the distal left colon off the retroperitoneum especially the ureters, gonadal vessels, left kidney and a medial to lateral retroperitoneal dissection.  Freed off lateral attachments of the sigmoid and descending colon up towards the splenic flexure to ensure good mobility of the colon  We then focused on mesorectal dissection.  Freed the mesorectum off the presacral plane until I was distal to the concerning region.  Freed off peritoneum on the lateral sidewalls as well and transected the mesentery of the lateral pedicles to get distal to the  area of concern.  I was able to free off the right pelvis rather well.  Then alternated between the anterior and left lateral pelvis to carefully free off the very dense concrete adhesions between the rectosigmoid colon and the bladder.  Came into an abscess  with significant purulence and granulation tissue.  I decompressed that.  Eventually freed off the rectosigmoid colon off the left lateral and anterior pelvis to get some good circumferential dissection.  Then went down and used blunt tipped EEA sizers to come up from the anus up to the rectum to confirm rectum straighten out with detorsion.  No stricturing of the mid rectum but definite thickening chronic scarring at the rectosigmoid junction.  Noted a region in the proximal rectum that seem to be healthy, distal to the chronic abscess cavity with no thinning.  Then transected at the distal margin with a robotic stapler- 60mm Green load.  I then had a circulating team insufflate the bladder with methylene blue  stained sterile solution.  Got distention to 350 mL with excellent bladder distention.  No extravasation implying no active leak at this time which is reassuring.  Urinary catheter placed back to gravity drainage.  We then chose a region for the proximal margin that would reach well for our planned anastomosis (distal descending colon).  Transected the colon mesentery radially to preserve good collateral and marginal artery blood supply.  To assess vascular perfusion of tissues, we asked anesthesia use intravenous  indocyanine green  (ICG) with IV flush.  I switched to the NIR fluorescence (Firefly mode) imaging window on the daVinci robot platform.  We were able to see good light green visualization of blood vessels with good vascular perfusion of tissues, confirming good tissue perfusion of tissues (distal descending colon and proximal rectum) planned for anastomosis.  Located the ileocecal valve and confirmed that the terminal ileum and had good mobility.  Given the patient's poor tissues, chronic malnutrition, pelvic abscess, chronic inflammation; I do not feel it was safe to not divert.  Dr. Debby agreed  We created an extraction incision through a small Pfannenstiel incision in the suprapubic  region.  Placed a wound protector.  I was able to eviscerate the rectosigmoid and descending colon out the wound.   I clamped the colon proximal to this area using a reusable pursestringer device.  Passed a 2-0 Keith needle. I transected at the descending/sigmoid junction with a scalpel. I got healthy bleeding mucosa.  We sent the rectosigmoid colon specimen off to go to pathology.  We sized the colon orifice.  I chose a 31mm EEA anvil stapler system.  I reinforced the prolene pursestring with interrupted silk belt loop sutures.  I placed the anvil to the open end of the proximal remaining colon and closed around it using the pursestring.    We did copious irrigation with crystalloid solution.  Hemostasis was good.  The distal end of the remaining colon easily reached down to the rectal stump, therefore, splenic flexure mobilization was not needed.      Dr Debby scrubbed down and did gentle anal dilation and advanced the EEA stapler up the rectal stump. The spike was brought out at the provimal end of the rectal stump under direct visualization.  I attached the anvil of the proximal colon the spike of the stapler. Anvil was tightened down and held clamped for 60 seconds.  Orientation was confirmed such that there is no twisting of the colon nor small bowel underneath the mesenteric defect. No concerning  tension.  The EEA stapler was fired and held clamped for 30 seconds. The stapler was released & removed. Blue stitch is in the proximal ring.  Care was taken to ensure no other structures were incorporated within this either.  We noted 2 excellent anastomotic rings.   The colon proximal to the anastomosis was then gently occluded. The pelvis was filled with sterile irrigation.  Dr Debby did flexible sigmoidoscopy.  Noted the anastomosis was at 20 cm with significant distention but with decompression was more at 17 cm from the anal verge consistent with the proximal rectum.  Intact anastomosis without active  bleeding.  No mucosal ischemia.  There was a negative air leak test. There was no tension of mesentery or bowel at the anastomosis.   Tissues looked viable.  Ureters & bowel uninjured.  The anastomosis looked healthy.  Greater omentum positioned down into the pelvis to help protect the anastomosis.  Given the chronic abscess cavity with malnutrition placed a 19 French pelvic Blake drain from a right lower quadrant port site and secured to the skin using 2-0 Prolene suture  Endoluminal gas was evacuated.  Ports & wound protector removed.  We changed gloves & redraped the patient per colon SSI prevention protocol.  We aspirated the sterile irrigation.  Hemostasis was good.  Sterile unused instruments were used from this point.    Next we focused on creating a diverting loop ileostomy (DLI).  Found the region of bowel that was appropriate at distal ileum.  Chose a location to bring up the ostomy at the  right infraumbilical paramedian region of the anterior abdominal wall that had been premarked by wound ostomy nurse team.  I had already placed an 8 mm port site through this location.  I made a 3 cm diameter excision of skin and came through dermis and excised a cylinder of subcutaneous fat to the fascia.  Came through the anterior rectus fascia transversely.  Split through the rectus muscle and came through the posterior rectus fascia vertically.  Dilated with fingers and brought to the bowel up.  Ran proximally distally confirm proper orientation with the distal end inferior in the 6:00 positioning.  Laid well as proper orientation.   I closed the skin at the port sites using Monocryl stitch and sterile dressing.  We assured hemostasis and the former ostomy wound.  Wound irrigated.  I closed the posterior rectus fascia with 0 Vicryl suture.  Anterior rectus fascia was closed using #1 PDS transversely.  Sterile dressing placed.   After ensuring that all other wounds had been closed and dressings placed, then  focused on maturing the ostomy.  The bowel was secured at the dermis using interrupted Vicryl sutures at superior and inferior corners medially and laterally x 4 total like a square.  Then the bowel was opened up.  Mucosa viable.  Used as initial 4 stitches to come through the mucosa and tied down to create a rosebud like Brooke double-barreled (proximal end is cephalad at '12 o'clock')ostomy elevated 3cm.  Then proceeded to continue interrupted Vicryl sutures circumferentially for good mucosal dermal apposition.  I checked the ostomy with my finger to confirm no stricturing or twisting.  Could not pass through the fascia without difficulty.  Clean ostomy appliance was cut to size and placed.    Patient is being extubated go to recovery room. I had discussed postop care with the patient in detail the office & in the holding area. Instructions are written.  I called the  patient's spouse who was not able to stay at the hospital  and got her voicemail.  Left a message explaining findings.  Will try and discuss further in person.  Given this patient's medical complexity with numerous readmissions, requested [facility] internal medicine to help follow this complicated patient.  I believe Dr. Celinda is coming through the PACU to help follow the patient with surgery.  Appreciated.  Elspeth KYM Schultze, M.D., F.A.C.S. Gastrointestinal and Minimally Invasive Surgery [facility] [address] [place], [place] [address] [phone] Main / Paging

## 2024-01-09 NOTE — Plan of Care (Signed)
  Problem: Education: Goal: Understanding of discharge needs will improve Outcome: Progressing Goal: Verbalization of understanding of the causes of altered bowel function will improve Outcome: Progressing   Problem: Health Behavior/Discharge Planning: Goal: Identification of community resources to assist with postoperative recovery needs will improve Outcome: Progressing   Problem: Respiratory: Goal: Respiratory status will improve Outcome: Progressing

## 2024-01-09 NOTE — Anesthesia Procedure Notes (Signed)
 Procedure Name: Intubation Date/Time: 01/09/2024 9:00 AM  Performed by: Dasie Nena PARAS, CRNAPre-anesthesia Checklist: Patient identified, Emergency Drugs available, Suction available, Patient being monitored and Timeout performed Patient Re-evaluated:Patient Re-evaluated prior to induction Oxygen Delivery Method: Circle system utilized Preoxygenation: Pre-oxygenation with 100% oxygen Induction Type: IV induction Ventilation: Mask ventilation without difficulty Laryngoscope Size: Miller and 3 Grade View: Grade I Tube type: Oral Tube size: 8.0 mm Number of attempts: 1 Airway Equipment and Method: Stylet Placement Confirmation: ETT inserted through vocal cords under direct vision, positive ETCO2 and breath sounds checked- equal and bilateral Secured at: 24 cm Tube secured with: Tape Dental Injury: Teeth and Oropharynx as per pre-operative assessment

## 2024-01-09 NOTE — Consult Note (Signed)
 CC: Urinary incontinence  HPI:  03/14/2023  73 year old male with a 5 or more year history of urinary incontinence. He is worn pull-ups for the last 4 to 5 years. Has urgency with associated urge urinary incontinence. Flomax  helps somewhat. Denies any obstructive voiding complaints. Not much nocturia and only has weak stream at night. PVR is 42. He has no hematuria or dysuria. He feels like it is not significant and he has 0 interest in additional treatment.   05/22/2023  Patient recently admitted to the hospital with sepsis secondary to E. coli bacteremia. CT scan on 12/12 showed distended bladder with mild left hydronephrosis. He was discharged with ampicillin . 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 As noted above, the patient has urgency with associated urge urinary incontinence and takes Flomax . PVR at the last visit was 42 and he had no interest in any further treatment.   06/13/2023: See last OV note for additional detail but in brief he is being worked up to establish his candidacy for possible PAE including follow-up imaging with prostate and renal ultrasound as well as urodynamics. These are scheduled here in the next couple of weeks. He continues with an indwelling Foley catheter. Seen acutely today due to concerns for gross hematuria.   Patient was readmitted from 12/29 through 1/7 with septic shock from UTI as well as new diagnosis of colovesicular fistula. 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 Urine culture positive for Pseudomonas. He was having urine passage from his  rectum. Foley catheter was continued and upsized during his hospitalization. He did require some manual irrigation but no CBI. Treated appropriately with antimicrobials based on culture sensitivity but no additional antibiotics were planned at time of discharge.  Urology and general surgery followed. Tentative plans are for him to undergo colonoscopy with GI before following up with general surgery to consider colorectal procedure/surgery. Today presents with catheter draining gross hematuria which has been present now since hospital discharge. He is not having painful or leaking urgency, denies fevers or chills, flank or lower back pain. His bedside bag drainage port has become clogged.   Renal function remained stable with GFR greater than 60. Hemoglobin ranged between 7 and 9 throughout his admission.   08/19/23   Today the patient returns to us  for an appointment originally scheduled for a catheter exchange.   The patient initially came to our practice for incontinence and saw Dr. Carolee. He was undergoing work-up and considering treatment for his BPH (likely PAE). However, he was hospitalized from 12/29-1/7 with urosepsis and diagnosed at that time with a colovesicular fistula. A foley catheter was placed at this time to assist with bladder emptying. Due to the presence of the fistula, prostate enlargement, and gross hematuria that was occurring acutely at his last visit on 06/13/23 with Ubaldo Eagles, NP, it was felt best to continue with the patient's catheter until he had evaluation by GI or general surgery for the colovesical fistula. However, the patient was very uncomfortable with the catheter and requested of his PCP to remove the catheter on 06/24/23, which was done. The patient has since been  in the hospital from 07/01/23-07/05/23 and then from 07/12/23-07/19/23 with urosepsis. He was then in SNF for rehab. He saw his PCP on 3/12 for f/u and urine culture was negative at that visit.   Today, Mr. Minshall  reports that he feels well. He walked 2 miles this morning. He has not seen anyone from GI or general surgery. He denies any issues with voiding and is sleeping better than ever because he started taking melatonin. He is happy with his current condition. He denies straining to void, frequent urination, or blood in the urine. He has very rare nocturia.   11/06/2023: 73 year old man sent for referral urgently by his primary care physician due to a renal ultrasound that shows left-sided mild hydronephrosis and a bladder mass. The bladder mass is the prostate indention into the bladder. He has had a cystoscopy within the last 4 months. This did not show any concern for tumors but did show a very large prostate. He has been evaluated for left-sided hydronephrosis and incomplete bladder emptying by our clinic this year. He has refused a Foley catheter as well as CIC. He is not interested in surgery to fix his prostate at this time and is more concerned about his colovesical fistula which he did see general surgery for. He is awaiting scheduling at this time. He otherwise feels that he voids per his norm and will sleep through the night. He denies fatigue, fevers, chills, gross hematuria.   12/25/2023  Patient is scheduled for robotic colectomy by Dr. Sheldon on 01/09/2024. Dr. Watt is scheduled to perform cystoscopy with ureteral firefly instillation. Patient also has chronic urinary retention. Today he had a Foley catheter but it is dislodged with the balloon in the mid urethra. Renal ultrasound showed 1700 cc in his bladder with mild bilateral hydronephrosis. He has no sensation to void.     ALLERGIES: No Known Drug Allergies    MEDICATIONS: Finasteride  5 MG Tablet 1 tablet PO Daily  Tamsulosin  HCl 0.4 MG Capsule 1 capsule PO BID  Tamsulosin  HCl 0.4 MG Capsule  Buspirone  Hcl  Calcium   Carvedilol  12.5 MG Tablet  Divalproex  Sodium 250 MG Tablet Delayed Release  LORazepam  1 MG Tablet  Losartan  Potassium 50 MG  Tablet  Pravastatin  Sodium  Trazodone  Hcl  Venlafaxine  Hcl     GU PSH: Cystoscopy - 05/22/2023 Insert Bladder Cath; Complex - 06/13/2023     NON-GU PSH: Visit Complexity (formerly GPC1X) - 11/06/2023, 08/19/2023, 08/16/2023, 06/13/2023     GU PMH: BPH w/LUTS - 11/06/2023, - 08/19/2023, - 08/16/2023, - 05/22/2023, - 03/14/2023 Chronic cystitis (w/o hematuria) - 11/06/2023, - 05/22/2023 Hydronephrosis - 11/06/2023, - 06/13/2023, - 05/22/2023 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    REVIEW OF SYSTEMS:    GU Review Male:   Patient denies frequent urination, hard to postpone urination, burning/ pain with urination, get up at night to urinate, leakage of urine, stream starts and stops, trouble starting your stream, have to strain to urinate , erection problems, and penile pain.  Gastrointestinal (Upper):   Patient denies nausea, vomiting, and indigestion/ heartburn.  Gastrointestinal (Lower):   Patient denies diarrhea and constipation.  Constitutional:  Patient denies fever, night sweats, weight loss, and fatigue.  Skin:   Patient denies skin rash/ lesion and itching.  Eyes:   Patient denies blurred vision and double vision.  Ears/ Nose/ Throat:   Patient denies sore throat and sinus problems.  Hematologic/Lymphatic:   Patient denies swollen glands and easy bruising.  Cardiovascular:   Patient denies leg swelling and chest pains.  Respiratory:   Patient denies cough and shortness of breath.  Endocrine:   Patient denies excessive thirst.  Musculoskeletal:   Patient denies back pain and joint pain.   Neurological:   Patient denies headaches and dizziness.  Psychologic:   Patient denies depression and anxiety.   VITAL SIGNS: None   GU PHYSICAL EXAMINATION:    Penis: Circumcised, no foreskin warts, no cracks. No dorsal peyronie's plaques, no left corporal peyronie's plaques, no right corporal peyronie's plaques, no scarring, no shaft warts. No balanitis, no meatal stenosis. Prostate bulb palpable in the mid prostate. Catheter deflated and removed   MULTI-SYSTEM PHYSICAL EXAMINATION:       Complexity of Data:  Source Of History:  Patient  Records Review:   Previous Doctor Records, Previous Patient Records  X-Ray Review: Renal Ultrasound: Reviewed Films. Discussed With Patient.     PROCEDURES:         Renal Ultrasound - 23224  Right Kidney: Length: 11.5 cm Depth: 9.2 cm Cortical Width: 1.6 cm Width: 6.6 cm  Left Kidney: Length: 12.9 cm Depth: 7.8 cm Cortical Width: 2.1 cm Width: 5.7 cm  Left Kidney/Ureter:  Mild Hydro  Right Kidney/Ureter:  Moderate Hydro  Bladder:  PVR 1703 ml Foley Bulb not Visualized      Patient confirmed No Neulasta OnPro Device.          Simple Foley Indwelling Cath Change - [id number]  The patient's indwelling foley tube was carefully removed. A 18 French Foley catheter was inserted into the bladder using sterile technique. UROJET prior. 20cc balloon. The patient was taught routine catheter care. 1500 cc of urine was obtained. A bedside bag was connected.         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 I discussed that there is a good likelihood that his bladder is nonfunctional at this point  especially with his lack of sensation with 1700 cc in his bladder and bilateral hydronephrosis. Recommend that he continue Foley catheter postoperatively and follow-up about a month later for catheter change and ensure that he has recovered well. If he has recovered well from his surgery, we will schedule him for urodynamics for further evaluation of his bladder function and also set him up for prostate ultrasound. He did not feel comfortable with performing clean intermittent catheterization.   CC: Dr. Jesus  Dr. Sheldon        Next Appointment:      Next Appointment: 01/09/2024 08:30 AM    Appointment Type: Surgery     Location: [facility] [phone]    Provider: Norleen Seltzer, M.D.  Reason for Visit: IP---[facility]--CYSTO/FIREFLY PRIOR TO DR GROSS

## 2024-01-09 NOTE — Discharge Instructions (Addendum)
 SURGERY: POST OP INSTRUCTIONS (Surgery for small bowel obstruction, colon resection, etc)   ######################################################################  EAT Gradually transition to a high fiber diet with a fiber supplement over the next few days after discharge  WALK Walk an hour a day.  Control your pain to do that.    CONTROL PAIN Control pain so that you can walk, sleep, tolerate sneezing/coughing, go up/down stairs.  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 See ostomy instructions below  Keep the Foley catheter in your bladder for another month  and follow-up with [facility] (Dr Carolee) to see if it will be safe to remove in September  CALL IF YOU HAVE PROBLEMS/CONCERNS Call if you are still struggling despite following these instructions. Call if you have concerns not answered by these instructions  ######################################################################   DIET Follow a light diet the first few days at home.  Start with a bland diet such as soups, liquids, starchy foods, low fat foods, etc.  If you feel full, bloated, or constipated, stay on a ful liquid or pureed/blenderized diet for a few days until you feel better and no longer constipated. Be sure to drink plenty of fluids every day to avoid getting dehydrated (feeling dizzy, not urinating, etc.). Gradually add a fiber supplement to your diet over the next week.  Gradually get back to a regular solid diet.  Avoid fast food or heavy meals the first week as you are more likely to get nauseated. It is expected for your digestive tract to need a few months to get back to normal.  It is common for your bowel movements and stools to be irregular.  You will have occasional bloating and cramping that should eventually fade away.   Until you are eating solid food normally, off all pain medications, and back to regular activities; your bowels will not be normal. Focus on eating a low-fat, high fiber diet the rest of your life (See Getting to Good Bowel Health, below).  CARE of your INCISION or WOUND  It is good for closed incisions and even open wounds to be washed every day.  Shower every day.  Short baths are fine.  Wash the incisions and wounds clean with soap & water .    You may leave closed incisions open to air if it is dry.   You may cover the incision with clean gauze & replace it after your daily shower for comfort.  TEGADERM:  You have clear gauze band-aid dressings over your closed incision(s).  Remove the dressings 2 days after surgery = 8/9.    If you have an open wound with a wound vac, see wound vac care instructions.    ACTIVITIES as tolerated Start light daily activities --- self-care, walking, climbing stairs-- beginning the day after surgery.  Gradually increase activities as tolerated.  Control your pain to be active.  Stop when you are tired.  Ideally, walk several times a day, eventually an hour a day.   Most people are back to most day-to-day activities in a few weeks.  It takes 4-8 weeks to get back to unrestricted, intense activity. If you can walk 30 minutes without difficulty, it is safe to try more intense activity such as jogging, treadmill, bicycling, low-impact aerobics, swimming, etc. Save the most intensive and strenuous activity for last (Usually 4-8  weeks after surgery) such as sit-ups, heavy lifting, contact sports, etc.  Refrain from any intense heavy lifting or straining until you are off narcotics for pain control.  You will have off days, but things should improve week-by-week. DO NOT PUSH THROUGH PAIN.  Let pain be your guide: If it hurts to do something, don't do it.  Pain is your body warning you to avoid that activity for another week until the pain goes down. You may drive when  you are no longer taking narcotic prescription pain medication, you can comfortably wear a seatbelt, and you can safely make sudden turns/stops to protect yourself without hesitating due to pain. You may have sexual intercourse when it is comfortable. If it hurts to do something, stop.   MEDICATIONS Take your usually prescribed home medications unless otherwise directed.    Blood thinners:  You can restart any strong blood thinners after the second postoperative day  for example: COUMADIN (warfarin), XERELTO (rivaroxaban), ELIQUIS (apixaban), PLAVIX (clopidigrel), BRILINTA (ticagrelor), EFFIENT (prasugrel), PRADAXA (dabigatran), etc  Continue aspirin  before & after surgery..     Some oozing/bleeding the first 1-2 weeks is common but should taper down & be small volume.    If you are passing many large clots or having uncontrolling bleeding, call your surgeon    PAIN CONTROL Pain after surgery or related to activity is often due to strain/injury to muscle, tendon, nerves and/or incisions.  This pain is usually short-term and will improve in a few months.  To help speed the process of healing and to get back to regular activity more quickly, DO THE FOLLOWING THINGS TOGETHER: Increase activity gradually.  DO NOT PUSH THROUGH PAIN Use Ice and/or Heat Try Gentle Massage and/or Stretching Take over the counter pain medication Take Narcotic prescription pain medication for more severe pain  Good pain control = faster recovery.  It is better to take more medicine to be more active than to stay in bed all day to avoid medications.  Increase activity gradually Avoid heavy lifting at first, then increase to lifting as tolerated over the next 6 weeks. Do not "push through" the pain.  Listen to your body and avoid positions and maneuvers than reproduce the pain.  Wait a few days before trying something more intense Walking an hour a day is encouraged to help your body recover faster and more  safely.  Start slowly and stop when getting sore.  If you can walk 30 minutes without stopping or pain, you can try more intense activity (running, jogging, aerobics, cycling, swimming, treadmill, sex, sports, weightlifting, etc.) Remember: If it hurts to do it, then don't do it! Use Ice and/or Heat You will have swelling and bruising around the incisions.  This will take several weeks to resolve. Ice packs or heating pads (6-8 times a day, 30-60 minutes at a time) will help sooth soreness & bruising. Some people prefer to use ice alone, heat alone, or alternate between ice & heat.  Experiment and see what works best for you.  Consider trying ice for the first few days to help decrease swelling and bruising; then, switch to heat to help relax sore spots and speed recovery. Shower every day.  Short baths are fine.  It feels good!  Keep the incisions and wounds clean with soap & water .   Try Gentle Massage and/or Stretching Massage at the area of pain many times a day Stop if you feel pain - do not overdo it Take over the counter  pain medication This helps the muscle and nerve tissues become less irritable and calm down faster Choose ONE of the following over-the-counter anti-inflammatory medications: Acetaminophen  500mg  tabs (Tylenol ) 1-2 pills with every meal and just before bedtime (avoid if you have liver problems or if you have acetaminophen  in you narcotic prescription) Naproxen 220mg  tabs (ex. Aleve, Naprosyn) 1-2 pills twice a day (avoid if you have kidney, stomach, IBD, or bleeding problems) Ibuprofen 200mg  tabs (ex. Advil, Motrin) 3-4 pills with every meal and just before bedtime (avoid if you have kidney, stomach, IBD, or bleeding problems) Take with food/snack several times a day as directed for at least 2 weeks to help keep pain / soreness down & more manageable. Take Narcotic prescription pain medication for more severe pain A prescription for strong pain control is often given to you  upon discharge (for example: oxycodone /Percocet, hydrocodone /Norco/Vicodin, or tramadol /Ultram ) Take your pain medication as prescribed. Be mindful that most narcotic prescriptions contain Tylenol  (acetaminophen ) as well - avoid taking too much Tylenol . If you are having problems/concerns with the prescription medicine (does not control pain, nausea, vomiting, rash, itching, etc.), please call us  [phone] to see if we need to switch you to a different pain medicine that will work better for you and/or control your side effects better. If you need a refill on your pain medication, you must call the office before 4 pm and on weekdays only.  By federal law, prescriptions for narcotics cannot be called into a pharmacy.  They must be filled out on paper & picked up from our office by the patient or authorized caretaker.  Prescriptions cannot be filled after 4 pm nor on weekends.    WHEN TO CALL US  ([phone] Severe uncontrolled or worsening pain  Fever over 101 F (38.5 C) Concerns with the incision: Worsening pain, redness, rash/hives, swelling, bleeding, or drainage Reactions / problems with new medications (itching, rash, hives, nausea, etc.) Nausea and/or vomiting Difficulty urinating Difficulty breathing Worsening fatigue, dizziness, lightheadedness, blurred vision Other concerns If you are not getting better after two weeks or are noticing you are getting worse, contact our office ([phone] for further advice.  We may need to adjust your medications, re-evaluate you in the office, send you to the emergency room, or see what other things we can do to help. The clinic staff is available to answer your questions during regular business hours (8:30am-5pm).  Please don't hesitate to call and ask to speak to one of our nurses for clinical concerns.    A surgeon from [facility] is always on call at the hospitals 24 hours/day If you have a medical emergency, go to the nearest  emergency room or call 911.  FOLLOW UP in our office One the day of your discharge from the hospital (or the next business weekday), please call [facility] to set up or confirm an appointment to see your surgeon in the office for a follow-up appointment.  Usually it is 2-3 weeks after your surgery.   If you have skin staples at your incision(s), let the office know so we can set up a time in the office for the nurse to remove them (usually around 10 days after surgery). Make sure that you call for appointments the day of discharge (or the next business weekday) from the hospital to ensure a convenient appointment time. IF YOU HAVE DISABILITY OR FAMILY LEAVE FORMS, BRING THEM TO THE OFFICE FOR PROCESSING.  DO NOT GIVE THEM TO YOUR  DOCTOR.  [facility] [address], [address], [place], [place]  [address] ? [phone] - Main [phone] - Toll Free,  [phone] - Fax [email or web address]    GETTING TO GOOD BOWEL HEALTH. It is expected for your digestive tract to need a few months to get back to normal.  It is common for your bowel movements and stools to be irregular.  You will have occasional bloating and cramping that should eventually fade away.  Until you are eating solid food normally, off all pain medications, and back to regular activities; your bowels will not be normal.   Avoiding constipation The goal: ONE SOFT BOWEL MOVEMENT A DAY!    Drink plenty of fluids.  Choose water  first. TAKE A FIBER SUPPLEMENT EVERY DAY THE REST OF YOUR LIFE During your first week back home, gradually add back a fiber supplement every day Experiment which form you can tolerate.   There are many forms such as powders, tablets, wafers, gummies, etc Psyllium bran (Metamucil), methylcellulose (Citrucel), Miralax  or Glycolax , Benefiber, Flax Seed.  Adjust the dose week-by-week (1/2 dose/day to 6 doses a day) until you are moving your bowels 1-2 times a  day.  Cut back the dose or try a different fiber product if it is giving you problems such as diarrhea or bloating. Sometimes a laxative is needed to help jump-start bowels if constipated until the fiber supplement can help regulate your bowels.  If you are tolerating eating & you are farting, it is okay to try a gentle laxative such as double dose MiraLax , prune juice, or Milk of Magnesia.  Avoid using laxatives too often. Stool softeners can sometimes help counteract the constipating effects of narcotic pain medicines.  It can also cause diarrhea, so avoid using for too long. If you are still constipated despite taking fiber daily, eating solids, and a few doses of laxatives, call our office. Controlling diarrhea Try drinking liquids and eating bland foods for a few days to avoid stressing your intestines further. Avoid dairy products (especially milk & ice cream) for a short time.  The intestines often can lose the ability to digest lactose when stressed. Avoid foods that cause gassiness or bloating.  Typical foods include beans and other legumes, cabbage, broccoli, and dairy foods.  Avoid greasy, spicy, fast foods.  Every person has some sensitivity to other foods, so listen to your body and avoid those foods that trigger problems for you. Probiotics (such as active yogurt, Align, etc) may help repopulate the intestines and colon with normal bacteria and calm down a sensitive digestive tract Adding a fiber supplement gradually can help thicken stools by absorbing excess fluid and retrain the intestines to act more normally.  Slowly increase the dose over a few weeks.  Too much fiber too soon can backfire and cause cramping & bloating. It is okay to try and slow down diarrhea with a few doses of antidiarrheal medicines.   Bismuth subsalicylate (ex. Kayopectate, Pepto Bismol) for a few doses can help control diarrhea.  Avoid if pregnant.   Loperamide  (Imodium ) can slow down diarrhea.  Start with one  tablet (2mg ) first.  Avoid if you are having fevers or severe pain.  ILEOSTOMY PATIENTS WILL HAVE CHRONIC DIARRHEA since their colon is not in use.    Drink plenty of liquids.  You will need to drink even more glasses of water /liquid a day to avoid getting dehydrated. Record output from your ileostomy.  Expect to empty the bag every  3-4 hours at first.  Most people with a permanent ileostomy empty their bag 4-6 times at the least.   Use antidiarrheal medicine (especially Imodium ) several times a day to avoid getting dehydrated.  Start with a dose at bedtime & breakfast.  Adjust up or down as needed.  Increase antidiarrheal medications as directed to avoid emptying the bag more than 8 times a day (every 3 hours). Work with your wound ostomy nurse to learn care for your ostomy.  See ostomy care instructions. TROUBLESHOOTING IRREGULAR BOWELS 1) Start with a soft & bland diet. No spicy, greasy, or fried foods.  2) Avoid gluten/wheat or dairy products from diet to see if symptoms improve. 3) Miralax  17gm or flax seed mixed in 8oz. water  or juice-daily. May use 2-4 times a day as needed. 4) Gas-X, Phazyme, etc. as needed for gas & bloating.  5) Prilosec (omeprazole ) over-the-counter as needed 6)  Consider probiotics (Align, Activa, etc) to help calm the bowels down  Call your doctor if you are getting worse or not getting better.  Sometimes further testing (cultures, endoscopy, X-ray studies, CT scans, bloodwork, etc.) may be needed to help diagnose and treat the cause of the diarrhea. [facility] [address], [address], [place], [place]  [address] [phone] - Main.    [phone]  - Toll Free.   [phone] - Fax  [email or web address]  #######################################################  Ostomy Support Information  You've heard that people get along just fine with only one of their eyes, or one of their lungs, or one of their kidneys. But you also  know that you have only one intestine and only one bladder, and that leaves you feeling awfully empty, both physically and emotionally: You think no other people go around without part of their intestine with the ends of their intestines sticking out through their abdominal walls.   YOU ARE NOT ALONE.  There are nearly three quarters of a million people in the [place]  who have an ostomy; people who have had surgery to remove all or part of their colons or bladders.   There is even a national association, the [place] [employer] [place] with over 350 local affiliated support groups that are organized by volunteers who provide peer support and counseling. [facility] has a toll free telephone num-ber, ([phone] [phone] and an educational, interactive website, www.ostomy.org   An ostomy is an opening in the belly (abdominal wall) made by surgery. Ostomates are people who have had this procedure. The opening (stoma) allows the kidney or bowel to grdischarge waste. An external pouch covers the stoma to collect waste. Pouches are are a simple bag and are odor free. Different companies have disposable or reusable pouches to fit one's lifestyle. An ostomy can either be temporary or permanent.   THERE ARE THREE MAIN TYPES OF OSTOMIES Colostomy. A colostomy is a surgically created opening in the large intestine (colon). Ileostomy. An ileostomy is a surgically created opening in the small intestine. Urostomy. A urostomy is a surgically created opening to divert urine away from the bladder.  OSTOMY Care  The following guidelines will make care of your colostomy easier. Keep this information close by for quick reference.  Helpful DIET hints Eat a well-balanced diet including vegetables and fresh fruits. Eat on a regular schedule.  Drink at least 6 to 8 glasses of fluids daily. Eat slowly in a relaxed atmosphere. Chew your food thoroughly. Avoid chewing gum, smoking, and drinking from a straw. This will help decrease  the amount of air you swallow, which may help reduce gas. Eating yogurt or drinking buttermilk may help reduce gas.  To control gas at night, do not eat after 8 p.m. This will give your bowel time to quiet down before you go to bed.  If gas is a problem, you can purchase Beano. Sprinkle Beano on the first bite of food before eating to reduce gas. It has no flavor and should not change the taste of your food. You can buy Beano over the counter at your local drugstore.  Foods like fish, onions, garlic, broccoli, asparagus, and cabbage produce odor. Although your pouch is odor-proof, if you eat these foods you may notice a stronger odor when emptying your pouch. If this is a concern, you may want to limit these foods in your diet.  If you have an ileostomy, you will have chronic diarrhea & need to drink more liquids to avoid getting dehydrated.  Consider antidiarrheal medicine like imodium  (loperamide ) or Lomotil to help slow down bowel movements / diarrhea into your ileostomy bag.  GETTING TO GOOD BOWEL HEALTH WITH AN ILEOSTOMY    With the colon bypassed & not in use, you will have small bowel diarrhea.   It is important to thicken & slow your bowel movements down.   The goal: 4-6 small BOWEL MOVEMENTS A DAY It is important to drink plenty of liquids to avoid getting dehydrated  CONTROLLING ILEOSTOMY DIARRHEA  TAKE A FIBER SUPPLEMENT (FiberCon or Benefiner soluble fiber) twice a day - to thicken stools by absorbing excess fluid and retrain the intestines to act more normally.  Slowly increase the dose over a few weeks.  Too much fiber too soon can backfire and cause cramping & bloating.  TAKE AN IRON  SUPPLEMENT twice a day to naturally constipate your bowels.  Usually ferrous sulfate  325mg  twice a day)  TAKE ANTI-DIARRHEAL MEDICINES: Loperamide  (Imodium ) can slow down diarrhea.  Start with two tablets (= 4mg ) first and then try one tablet every 6 hours.  Can go up to 2 pills four times day (8  pills of 2mg  max) Avoid if you are having fevers or severe pain.  If you are not better or start feeling worse, stop all medicines and call your doctor for advice LoMotil (Diphenoxylate / Atropine) is another medicine that can constipate & slow down bowel moevements Pepto Bismol (bismuth) can gently thicken bowels as well  If diarrhea is worse,: drink plenty of liquids and try simpler foods for a few days to avoid stressing your intestines further. Avoid dairy products (especially milk & ice cream) for a short time.  The intestines often can lose the ability to digest lactose when stressed. Avoid foods that cause gassiness or bloating.  Typical foods include beans and other legumes, cabbage, broccoli, and dairy foods.  Every person has some sensitivity to other foods, so listen to our body and avoid those foods that trigger problems for you.Call your doctor if you are getting worse or not better.  Sometimes further testing (cultures, endoscopy, X-ray studies, bloodwork, etc) may be needed to help diagnose and treat the cause of the diarrhea. Take extra anti-diarrheal medicines (maximum is 8 pills of 2mg  loperamide  a day)   Tips for POUCHING an OSTOMY   Changing Your Pouch The best time to change your pouch is in the morning, before eating or drinking anything. Your stoma can function at any time, but it will function more after eating or drinking.   Applying the pouching system  Place all your equipment close at hand before removing your pouch.  Wash your hands.  Stand or sit in front of a mirror. Use the position that works best for you. Remember that you must keep the skin around the stoma wrinkle-free for a good seal.  Gently remove the used pouch (1-piece system) or the pouch and old wafer (2-piece system). Empty the pouch into the toilet. Save the closure clip to use again.  Wash the stoma itself and the skin around the stoma. Your stoma may bleed a little when being washed. This is  normal. Rinse and pat dry. You may use a wash cloth or soft paper towels (like Bounty), mild soap (like Dial, Safeguard, or [place]), and water . Avoid soaps that contain perfumes or lotions.  For a new pouch (1-piece system) or a new wafer (2-piece system), measure your stoma using the stoma guide in each box of supplies.  Trace the shape of your stoma onto the back of the new pouch or the back of the new wafer. Cut out the opening. Remove the paper backing and set it aside.  Optional: Apply a skin barrier powder to surrounding skin if it is irritated (bare or weeping), and dust off the excess. Optional: Apply a skin-prep wipe (such as Skin Prep or All-Kare) to the skin around the stoma, and let it dry. Do not apply this solution if the skin is irritated (red, tender, or broken) or if you have shaved around the stoma. Optional: Apply a skin barrier paste (such as Stomahesive, Coloplast, or Premium) around the opening cut in the back of the pouch or wafer. Allow it to dry for 30 to 60 seconds.  Hold the pouch (1-piece system) or wafer (2-piece system) with the sticky side toward your body. Make sure the skin around the stoma is wrinkle-free. Center the opening on the stoma, then press firmly to your abdomen (Fig. 4). Look in the mirror to check if you are placing the pouch, or wafer, in the right position. For a 2-piece system, snap the pouch onto the wafer. Make sure it snaps into place securely.  Place your hand over the stoma and the pouch or wafer for about 30 seconds. The heat from your hand can help the pouch or wafer stick to your skin.  Add deodorant (such as Super Banish or Nullo) to your pouch. Other options include food extracts such as vanilla oil and peppermint extract. Add about 10 drops of the deodorant to the pouch. Then apply the closure clamp. Note: Do not use toxic  chemicals or commercial cleaning agents in your pouch. These substances may harm the stoma.  Optional: For extra seal,  apply tape to all 4 sides around the pouch or wafer, as if you were framing a picture. You may use any brand of medical adhesive tape. Change your pouch every 5 to 7 days. Change it immediately if a leak occurs.  Wash your hands afterwards.  If you are wearing a 2-piece system, you may use 2 new pouches per week and alternate them. Rinse the pouch with mild soap and warm water  and hang it to dry for the next day. Apply the fresh pouch. Alternate the 2 pouches like this for a week. After a week, change the wafer and begin with 2 new pouches. Place the old pouches in a plastic bag, and put them in the trash.   LIVING WITH AN OSTOMY  Emptying Your Pouch Empty your pouch when it is one-third full (  of urine, stool, and/or gas). If you wait until your pouch is fuller than this, it will be more difficult to empty and more noticeable. When you empty your pouch, either put toilet paper in the toilet bowl first, or flush the toilet while you empty the pouch. This will reduce splashing. You can empty the pouch between your legs or to one side while sitting, or while standing or stooping. If you have a 2-piece system, you can snap off the pouch to empty it. Remember that your stoma may function during this time. If you wish to rinse your pouch after you empty it, a turkey baster can be helpful. When using a baster, squirt water  up into the pouch through the opening at the bottom. With a 2-piece system, you can snap off the pouch to rinse it. After rinsing  your pouch, empty it into the toilet. When rinsing your pouch at home, put a few granules of Dreft soap in the rinse water . This helps lubricate and freshen your pouch. The inside of your pouch can be sprayed with non-stick cooking oil (Pam spray). This may help reduce stool sticking to the inside of the pouch.  Bathing You may shower or bathe with your pouch on or off. Remember that your stoma may function during this time.  The materials you use to wash  your stoma and the skin around it should be clean, but they do not need to be sterile.  Wearing Your Pouch During hot weather, or if you perspire a lot in general, wear a cover over your pouch. This may prevent a rash on your skin under the pouch. Pouch covers are sold at ostomy supply stores. Wear the pouch inside your underwear for better support. Watch your weight. Any gain or loss of 10 to 15 pounds or more can change the way your pouch fits.  Going Away From Home A collapsible cup (like those that come in travel kits) or a soft plastic squirt bottle with a pull-up top (like a travel bottle for shampoo) can be used for rinsing your pouch when you are away from home. Tilt the opening of the pouch at an upward angle when using a cup to rinse.  Carry wet wipes or extra tissues to use in public bathrooms.  Carry an extra pouching system with you at all times.  Never keep ostomy supplies in the glove compartment of your car. Extreme heat or cold can damage the skin barriers and adhesive wafers on the pouch.  When you travel, carry your ostomy supplies with you at all times. Keep them within easy reach. Do not pack ostomy supplies in baggage that will be checked or otherwise separated from you, because your baggage might be lost. If you're traveling out of the country, it is helpful to have a letter stating that you are carrying ostomy supplies as a medical necessity.  If you need ostomy supplies while traveling, look in the yellow pages of the telephone book under "Surgical Supplies." Or call the local ostomy organization to find out where supplies are available.  Do not let your ostomy supplies get low. Always order new pouches before you use the last one.  Reducing Odor Limit foods such as broccoli, cabbage, onions, fish, and garlic in your diet to help reduce odor. Each time you empty your pouch, carefully clean the opening of the pouch, both inside and outside, with toilet paper. Rinse  your pouch 1 or 2 times daily after you empty it (see  directions for emptying your pouch and going away from home). Add deodorant (such as Super Banish or Nullo) to your pouch. Use air deodorizers in your bathroom. Do not add aspirin  to your pouch. Even though aspirin  can help prevent odor, it could cause ulcers on your stoma.  When to call the doctor Call the doctor if you have any of the following symptoms: Purple, black, or white stoma Severe cramps lasting more than 6 hours Severe watery discharge from the stoma lasting more than 6 hours No output from the colostomy for 3 days Excessive bleeding from your stoma Swelling of your stoma to more than 1/2-inch larger than usual Pulling inward of your stoma below skin level Severe skin irritation or deep ulcers Bulging or other changes in your abdomen  When to call your ostomy nurse Call your ostomy/enterostomal therapy (WOCN) nurse if any of the following occurs: Frequent leaking of your pouching system Change in size or appearance of your stoma, causing discomfort or problems with your pouch Skin rash or rawness Weight gain or loss that causes problems with your pouch     FREQUENTLY ASKED QUESTIONS   Why haven't you met any of these folks who have an ostomy?  Well, maybe you have! You just did not recognize them because an ostomy doesn't show. It can be kept secret if you wish. Why, maybe some of your best friends, office associates or neighbors have an ostomy ... you never can tell. People facing ostomy surgery have many quality-of-life questions like: Will you bulge? Smell? Make noises? Will you feel waste leaving your body? Will you be a captive of the toilet? Will you starve? Be a social outcast? Get/stay married? Have babies? Easily bathe, go swimming, bend over?  OK, let's look at what you can expect:   Will you bulge?  Remember, without part of the intestine or bladder, and its contents, you should have a flatter tummy than  before. You can expect to wear, with little exception, what you wore before surgery ... and this in-cludes tight clothing and bathing suits.   Will you smell?  Today, thanks to modern odor proof pouching systems, you can walk into an ostomy support group meeting and not smell anything that is foul or offensive. And, for those with an ileostomy or colostomy who are concerned about odor when emptying their pouch, there are in-pouch deodorants that can be used to eliminate any waste odors that may exist.   Will you make noises?  Everyone produces gas, especially if they are an air-swallower. But intestinal sounds that occur from time to time are no differ-ent than a gurgling tummy, and quite often your clothing will muffle any sounds.   Will you feel the waste discharges?  For those with a colostomy or ileostomy there might be a slight pressure when waste leaves your body, but understand that the intestines have no nerve endings, so there will be no unpleasant sensations. Those with a urostomy will probably be unaware of any kidney drainage.   Will you be a captive of the toilet?  Immediately post-op you will spend more time in the bathroom than you will after your body recovers from surgery. Every person is different, but on average those with an ileostomy or urostomy may empty their pouches 4 to 6 times a day; a little  less if you have a colostomy. The average wear time between pouch system changes is 3 to 5 days and the changing process should take less than 30  minutes.   Will I need to be on a special diet? Most people return to their normal diet when they have recovered from surgery. Be sure to chew your food well, eat a well-balanced diet and drink plenty of fluids. If you experience problems with a certain food, wait a couple of weeks and try it again.  Will there be odor and noises? Pouching systems are designed to be odor-proof or odor-resistant. There are deodorants that can be used in the  pouch. Medications are also available to help reduce odor. Limit gas-producing foods and carbonated beverages. You will experience less gas and fewer noises as you heal from surgery.  How much time will it take to care for my ostomy? At first, you may spend a lot of time learning about your ostomy and how to take care of it. As you become more comfortable and skilled at changing the pouching system, it will take very little time to care for it.   Will I be able to return to work? People with ostomies can perform most jobs. As soon as you have healed from surgery, you should be able to return to work. Heavy lifting (more than 10 pounds) may be discouraged.   What about intimacy? Sexual relationships and intimacy are important and fulfilling aspects of your life. They should continue after ostomy surgery. Intimacy-related concerns should be discussed openly between you and your partner.   Can I wear regular clothing? You do not need to wear special clothing. Ostomy pouches are fairly flat and barely noticeable. Elastic undergarments will not hurt the stoma or prevent the ostomy from functioning.   Can I participate in sports? An ostomy should not limit your involvement in sports. Many people with ostomies are runners, skiers, swimmers or participate in other active lifestyles. Talk with your caregiver first before doing heavy physical activity.  Will you starve?  Not if you follow doctor's orders at each stage of your post-op adjustment. There is no such thing as an "ostomy diet". Some people with an ostomy will be able to eat and tolerate anything; others may find diffi-culty with some foods. Each person is an individual and must determine, by trial, what is best for them. A good practice for all is to drink plenty of water .   Will you be a social outcast?  Have you met anyone who has an ostomy and is a social outcast? Why should you be the first? Only your attitude and self image will effect how  you are treated. No confi-dent person is an Investment banker, corporate.    PROFESSIONAL HELP   Resources are available if you need help or have questions about your ostomy.   Specially trained nurses called Wound, Ostomy Continence Nurses (WOCN) are available for consultation in most major medical centers.  Consider getting an ostomy consult at an outpatient ostomy clinic.   [place] [facility] has an Ostomy Clinic run by an Chartered certified accountant at the [facility].  [phone]. [facility] can help set up an appointment   The [employer] ([employer]) is a group made up of many local chapters throughout the [place] . These local groups hold meetings and provide support to prospective and existing ostomates. They sponsor educational events and have qualified visitors to make personal or telephone visits. Contact the [employer] for the chapter nearest you and for other educational publications.  More detailed information can be found in Colostomy Guide, a publication of the [employer] ([employer]). Contact [employer]  at [phone] or visit their web site at YellowSpecialist.at. The website contains links to other sites, suppliers and resources.  Media planner Start Services: Start at the website to enlist for support.  Your Wound Ostomy (WOCN) nurse may have started this process. [email or web address] Secure Start services are designed to support people as they live their lives with an ostomy or neurogenic bladder. Enrolling is easy and at no cost to the patient. We realize that each person's needs and life journey are different. Through Secure Start services, we want to help people live their life, their way.  #######################################################

## 2024-01-09 NOTE — Anesthesia Postprocedure Evaluation (Addendum)
 Anesthesia Post Note  Patient: Calvin Escobar  Procedure(s) Performed: ROBOTIC LOW ANTERIOR RESECTION WITH ANASTOMOSIS, DRAINAGE OF PELVIC ABSCESS, COLOVESICAL FISTULA TAKEDOWN AND REPAIR, INTRAOPERATIVE ASSESSMENT OF TISSUE PERFUSION USING ICG DYE, AND BILATERAL TAP BLOCK CREATION OF DIVERTING LOOP ILEOSTOMY SIGMOIDOSCOPY, FLEXIBLE LYSIS, ADHESIONS, ROBOT-ASSISTED, LAPAROSCOPIC CYSTOSCOPY WITH INDOCYANINE GREEN  IMAGING (ICG)     Patient location during evaluation: PACU Anesthesia Type: General Level of consciousness: sedated and patient cooperative Pain management: pain level controlled Vital Signs Assessment: post-procedure vital signs reviewed and stable Respiratory status: spontaneous breathing Cardiovascular status: stable Anesthetic complications: no Comments: Pt with marginal SpO2 at times, and BP would drop to low 80's with coughing, but would stabilize shortly after. Discussed with Dr. Sheldon and decision made to put patient in stepdown.    No notable events documented.  Last Vitals:  Vitals:   01/09/24 1345 01/09/24 1400  BP: 104/67   Pulse: 70 68  Resp: 15 17  Temp:    SpO2: 100% 90%    Last Pain:  Vitals:   01/09/24 1400  TempSrc:   PainSc: 0-No pain                 Norleen Pope

## 2024-01-09 NOTE — H&P (Signed)
 01/09/2024    Chief Complaint: New Consultation     Calvin Escobar is a 73 y.o. 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   History of Present Illness:   Patient returns.  He had been admitted in the hospital numerous times for UTIs.  I had seen him briefly in our group and followed.  Patient was hospitalized for many weeks the first few months.  He went to a skilled nursing facility in February to March.  He is now home.  He comes a by himself.  His wife and children are involved but not here with him today.  He is eating better appetite better.  However he started having feelings of recurrent urinary tract infection.  Got a urine culture which showed Candida albicans.  Started on fluconazole  by his primary care physician.  I think that motivated him to reconsider surgery.  Patient claims he can walk at least 10-20 minutes without much difficulty.  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  No prior abdominal surgery that he is aware of.  He used to be morbidly obese but has had some intentional weight loss.   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  Then had a Pseudomonas UTI.  Concerning for colovesical fistula.  Readmitted with Pseudomonas UTI resistant to Zosyn  in late January to February 2025.  No definite obvious fistula on cystoscopy but CAT scan and recurrent UTIs much more suggestive.  Had outpatient follow-up with urology.  There were discussion about colonoscopy and consider colorectal surgery.     Medical History:       Past Medical History:  Diagnosis Date   Arthritis     GERD (gastroesophageal reflux disease)     Hypertension        There is no problem list on file for this patient.          Past Surgical History:  Procedure Laterality Date   Right/left  heart cath and coronary angiography           No Known Allergies         Current Outpatient Medications on File Prior to Visit  Medication Sig Dispense Refill   ARIPiprazole  (ABILIFY ) 2 MG tablet Take 2 mg by mouth       busPIRone  (BUSPAR ) 10 MG tablet Take 10 mg by mouth 3 (three) times daily       carvediloL  (COREG ) 12.5 MG tablet take 1 tablet (12.5mg  total) by mouth twice a day with meals       famotidine  (PEPCID ) 20 MG tablet Take 20 mg by mouth 2 (two) times daily       fluconazole  (DIFLUCAN ) 200 MG tablet Take 200 mg by mouth       fluticasone  propionate (FLONASE ) 50 mcg/actuation nasal spray Place 2 sprays into both nostrils once daily       folic acid  (FOLVITE ) 1 MG tablet Take 1,000 mcg by mouth once daily       gabapentin  (NEURONTIN ) 100 MG capsule Take 100 mg by mouth 3 (three) times daily       losartan  (COZAAR ) 50 MG tablet TAKE 1 TABLET (50 MG TOTAL) BY MOUTH DAILY. REPLACES ENTRESTO , START AFTER DONE WITH ENTRESTO        midodrine  (PROAMATINE ) 5 MG tablet Take 5 mg by  mouth       mirtazapine  (REMERON ) 30 MG tablet Take 30 mg by mouth at bedtime       pravastatin  (PRAVACHOL ) 20 MG tablet Take 20 mg by mouth at bedtime       primidone  (MYSOLINE ) 50 MG tablet Take 50 mg by mouth 3 (three) times daily       venlafaxine  (EFFEXOR -XR) 75 MG XR capsule TAKE 1 CAPSULE ORALLY DAILY TAKE WITH 150 MG FOR 225 MG DAILY        No current facility-administered medications on file prior to visit.      History reviewed. No pertinent family history.    Social History       Tobacco Use  Smoking Status Never  Smokeless Tobacco Never      Social History        Socioeconomic History   Marital status: Married  Tobacco Use   Smoking status: Never   Smokeless tobacco: Never  Substance and Sexual Activity   Alcohol use: Yes   Drug use: Never    Social Drivers of Acupuncturist Strain: Low Risk  (10/03/2022)    Received from [facility]    Overall Financial  Resource Strain (CARDIA)     Difficulty of Paying Living Expenses: Not hard at all  Food Insecurity: No Food Insecurity (07/13/2023)    Received from [facility]    Hunger Vital Sign     Worried About Running Out of Food in the Last Year: Never true     Ran Out of Food in the Last Year: Never true  Transportation Needs: No Transportation Needs (07/13/2023)    Received from [facility] - Transportation     Lack of Transportation (Medical): No     Lack of Transportation (Non-Medical): No  Physical Activity: Inactive (05/04/2022)    Received from [facility]    Exercise Vital Sign     Days of Exercise per Week: 0 days     Minutes of Exercise per Session: 0 min  Stress: Stress Concern Present (11/30/2022)    Received from [facility] - Occupational Stress Questionnaire     Feeling of Stress : Rather much  Social Connections: Moderately Isolated (07/13/2023)    Received from [facility]    Social Connection and Isolation Panel [NHANES]     Frequency of Communication with Friends and Family: More than three times a week     Frequency of Social Gatherings with Friends and Family: Once a week     Attends Religious Services: Never     Database administrator or Organizations: No     Attends Banker Meetings: Never     Marital Status: Married  Housing Stability: Unknown (09/23/2023)    Housing Stability Vital Sign     Homeless in the Last Year: No      ############################################################   Review of Systems: A complete review of systems (ROS) was obtained from the patient.   We have reviewed this information and discussed as appropriate with the patient.   See HPI as well for other pertinent ROS.   Constitutional:  No fevers, chills, sweats.  Weight stable Eyes:  No vision changes, No discharge HENT:  No sore throats, nasal drainage Lymph: No neck swelling, No bruising easily Pulmonary:  No cough,  productive sputum CV: No orthopnea, PND . No exertional chest/neck/shoulder/arm pain.  Patient  can walk 10 minutes gradually.     GI:  No personal nor family history of GI/colon cancer, inflammatory bowel disease, irritable bowel syndrome, allergy such as Celiac Sprue, dietary/dairy problems, colitis, ulcers nor gastritis.  No recent sick contacts/gastroenteritis.  No travel outside the country.  No changes in diet.   Renal: No UTIs, No hematuria Genital:  No drainage, bleeding, masses Musculoskeletal: No severe joint pain.   Good ROM major joints Skin:  No sores or lesions Heme/Lymph:  No easy bleeding.  No swollen lymph nodes Neuro:  No active seizures.  No facial droop Psych:  No hallucinations.  No agitation   OBJECTIVE        Vitals:    09/23/23 1510 09/23/23 1511  BP: (!) 140/88    Pulse: (!) 117    Temp: 36.8 C (98.3 F)    SpO2: 97%    Weight: (!) 106.1 kg (234 lb)    Height: 182.9 cm (6')    PainSc:   0-No pain    Body mass index is 31.74 kg/m.   PHYSICAL EXAM:     Constitutional: Not cachectic.  Hygeine adequate.  Vitals signs as above.   Eyes: Wears glasses - vision corrected,Pupils reactive, normal extraocular movements. Sclera nonicteric Neuro: CN II-XII intact.  No major focal sensory defects.  No major motor deficits. Lymph: No head/neck/groin lymphadenopathy Psych:  No severe agitation.  No severe anxiety.  Judgment & insight Adequate, Oriented x4, however his some of his medium to long-term memory is impaired. HENT: Normocephalic, Mucus membranes moist.  No thrush.  Hearing: adequate Neck: Supple, No tracheal deviation.  No obvious thyromegaly Chest: No pain to chest wall compression.  Good respiratory excursion.  No audible wheezing CV:  Pulses intact.  regular.  No major extremity edema Ext: No obvious deformity or contracture.  Edema: Not present.  No cyanosis Skin: No major subcutaneous nodules.  Warm and dry Musculoskeletal: Severe joint rigidity not  present.  No obvious clubbing.  No digital petechiae.  Mobility: no assist device moving easily without restrictions   Abdomen:  Obese Soft.   Nondistended.  Nontender.    Hernia: Not present. Diastasis recti: Not present.  No hepatomegaly.  No splenomegaly.   Genital/Pelvic:  Inguinal hernia: Not present.  Inguinal lymph nodes: without lymphadenopathy nor hidradenitis.  Does have a mild red rash underneath a mild panniculus with foul yeasty odor   Rectal: (Deferred)         ###################################################################   Labs, Imaging and Diagnostic Testing:   Located in 'Care Everywhere' section of Epic EMR chart     [facility] NOTES:   Located in 'Care Everywhere' section of Epic EMR chart     SURGERY NOTES:   Located in 'Care Everywhere' section of Epic EMR chart     PATHOLOGY:   Located in 'Care Everywhere' section of Epic EMR chart       Assessment and Plan:  DIAGNOSES:   There are no diagnoses linked to this encounter.    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   We had seen this patient over winter New Year's break when he was in his second admission and recommended considering surgery at some point.  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  I did caution he may need a temporary or diverting ostomy and multiple operations to get through this.  He he really is against that.  He understands that he keeps having recurrent problems with more difficult UTIs now yeast/candidal, and he needs surgery to break the cycle of infections and fix the problem.  Hopefully if we can do this proactively we can solve this with 1 operation and minimize the chance of needing a temporary or  permanent colostomy.  I worry delaying is not working since he has been admitted 3 times and has his 4th or 5th documented UTI in the past 4 months. The anatomy & physiology of the digestive tract was discussed.  The pathophysiology of  fistula between the bowel and bladder was discussed.  Natural history risks without surgery was discussed. I worked to give an overview of the disease and the frequent need to have multispecialty involvement.    I feel the risks of no intervention will lead to serious problems that outweigh the operative risks; therefore, I recommended surgery to treat the pathology.  Laparoscopic & open techniques for partial proctocolectomy with bladder repair were discussed.  Possible fecal diversion by ostomy was discussed.  We will work to preserve anal & pelvic floor function without sacrificing cure.  Need for prolonged bladder catheterization was discussed.   Risks such as bleeding, infection, abscess, leak, injury to other organs, need for repair of tissues / organs, recurrence with reoperation, possible ostomy, hernia, heart attack, death, and other risks were discussed.  I noted a good likelihood this will help address the problem.   Goals of post-operative recovery were discussed as well.  We will work to minimize complications.  An educational handout on the pathology was given as well.  Questions were answered.     The patient expresses understanding & wishes to proceed with surgery.  I went over this repeatedly.  I wrote down my recommendations.  Family is aware     FOLLOWUP:  SURGERY   I have reviewed this patient's available data, including medical history, doctor notes, radiology & other studies, events of note, test results, physical exam, etc as part of my evaluation.   A significant portion of that time was spent in counseling in discussion of management, need for further testing, need for other medical or surgical input, etc.   Care was provided by me.   Based on  my assessment, this care required HIGH - Level 5 level of medical decision making.  09/23/2023     The plan was discussed in detail with the patient today, who expressed understanding & appreciation.  The patient has my contact information, and understands to call me with any additional questions or concerns.  I & my group would be happy to see the patient back sooner if the need arises.   Of note, portions of this report may have been transcribed using voice recognition software. Effort was made to ensure accuracy; however, inadvertent computerized transcription errors with sound-a-like substitutions may be present.   Any transcriptional errors that result from this process are unintentional.   ########################################################

## 2024-01-09 NOTE — Anesthesia Procedure Notes (Addendum)
 Arterial Line Insertion Start/End8/12/2023 8:10 AM, 01/09/2024 8:35 AM Performed by: Darlyn Rush, MD, anesthesiologist  Patient location: Pre-op. Preanesthetic checklist: patient identified, IV checked, site marked, risks and benefits discussed, surgical consent, monitors and equipment checked, pre-op evaluation, timeout performed and anesthesia consent Lidocaine  1% used for infiltration Left, radial was placed Catheter size: 20 G Hand hygiene performed , maximum sterile barriers used  and Seldinger technique used  Attempts: 4 (3 x unsuccessful (2 x by CRNA on R, 1 x by MD with US ), 1 x MD with US  on L) Procedure performed using ultrasound guided technique. Ultrasound Notes:anatomy identified, needle tip was noted to be adjacent to the nerve/plexus identified, no ultrasound evidence of intravascular and/or intraneural injection and image(s) printed for medical record Following insertion, dressing applied and Biopatch. Post procedure assessment: normal and unchanged  Post procedure complications: second provider assisted and unsuccessful attempts. Patient tolerated the procedure with difficulty.

## 2024-01-09 NOTE — Op Note (Signed)
 Procedure: Cystoscopy with bilateral ureteral catheterization and occlusion of firefly.  Preop diagnosis: Colovesical fistula and chronic retention.  Postop diagnosis: Same.  Surgeon: Dr. Norleen Seltzer.  Anesthesia: General.  Specimen: None.  Drains: 18 French three-way Foley catheter.  EBL: None.  Complications: None.  Indications: The patient is a 73 year old male with a colovesical fistula's undergo surgical management today he also has a history of chronic urinary retention that is managed with a Foley currently.  Procedure: He was taken operating room where he was given general anesthetic.  Antibiotics were given per general surgery.  He was placed in lithotomy position and fitted with PAS hose.  His perineum and genitalia were prepped with Betadine solution was draped in usual sterile fashion.  Cystoscopy was performed using a 21 [place] scope and 30 degree lens.  Examination revealed a normal urethra.  The external sphincter was intact.  The prostatic urethra had trilobar hyperplasia with a large obstructing middle lobe and was approximately 4 cm in length.  Examination of the bladder revealed severe trabeculation with multiple cellules and small diverticuli with significant mucosal edema and inflammation.  A herald patch was noted on the right anterior lateral wall from his fistula.  Ureteral orifices while initially difficult to find were in the normal anatomic position.  A 5 [place] open-ended catheter was then used to instill 7.5 mL of firefly solution in the left ureter and then this was repeated on the right side with an additional 7.5 mL.  Both ureteral cannulations were uneventful.  The cystoscope was removed and an 40 French three-way Foley catheter was placed.  The balloon was filled with 10 mL of sterile fluid.  The catheter was connected to an irrigation bag for Dr. Sheldon is used during his portion of the procedure and to straight drainage.  Procedure was then turned over to Dr.  Sheldon.  There were no complications.

## 2024-01-09 NOTE — Anesthesia Procedure Notes (Addendum)
 Central Venous Catheter Insertion Performed by: Darlyn Rush, MD, anesthesiologist Start/End8/12/2023 9:01 AM, 01/09/2024 9:16 AM Patient location: Pre-op. Preanesthetic checklist: patient identified, IV checked, site marked, risks and benefits discussed, surgical consent, monitors and equipment checked, pre-op evaluation, timeout performed and anesthesia consent Position: supine Lidocaine  1% used for infiltration and patient sedated Hand hygiene performed  and maximum sterile barriers used  Catheter size: 8 Fr Total catheter length 16. Central line was placed.Double lumen Procedure performed using ultrasound guided technique. Ultrasound Notes:anatomy identified, needle tip was noted to be adjacent to the nerve/plexus identified, no ultrasound evidence of intravascular and/or intraneural injection and image(s) printed for medical record Attempts: 1 Following insertion, dressing applied, line sutured and Biopatch. Post procedure assessment: blood return through all ports, free fluid flow and no air  Patient tolerated the procedure well with no immediate complications.

## 2024-01-09 NOTE — Consult Note (Signed)
 Initial Consultation Note   Patient: Calvin Escobar FMW:[id number] DOB: Dec 27, 1950 PCP: [name], MD DOA: 01/09/2024 DOS: the patient was seen and examined on 01/09/2024 Primary service: Sheldon Standing, MD  Referring physician: Garnette Sheldon, MD. Reason for consult: Medical management.  Assessment/Plan: Principal Problem:   Stricture of sigmoid colon (HCC)   Colovesical fistula Status post robotic sigmoidectomy Continue postop management per primary team. Analgesics and antiemetics as needed.  Active Problems:   OSA treated with BiPAP Hold BiPAP for now.    Chronic kidney disease, stage 3a (HCC) Monitor renal function electrolytes.    HLD (hyperlipidemia) Continue pravastatin  20 mg p.o. daily.    Chronic bronchitis (HCC) Continue supplemental oxygen. May use bronchodilators as needed.    Dementia (HCC)   Bipolar affective disorder, depressed (HCC) Continue aripiprazole  2 mg every morning. Continue venlafaxine  150 mg p.o. daily.    Primary insomnia Continue trazodone  100 mg p.o. daily. Continue melatonin 3 mg p.o. at bedtime as needed.    Hypertension Continue carvedilol  6.25 mg p.o. daily.    Chronic HFrEF (heart failure with reduced ejection fraction) (HCC) Monitor fluid balance carefully.    BPH (benign prostatic hyperplasia) Continue tamsulosin  0.4 mg p.o. daily. Continue finasteride  5 mg p.o. daily.    Stricture of male urethra   History of urinary retention   Chronic indwelling Foley catheter Continue to monitor clinically. Urology consult appreciated.    History of COVID-19  [facility] will continue to follow the patient.  HPI: Calvin Escobar is a 73 y.o. male with past medical history of cystitis, hyponatremia, hyperkalemia due to losartan  use, AKI, high anion gap acidosis, acute metabolic encephalopathy, history of respiratory failure with hypoxia, seasonal allergies, anxiety, depression, dementia, C. difficile colitis, Candida UTI, candidemia,  chronic systolic CHF, nonischemic cardiomyopathy's with normal coronaries during cath in 2021, E. coli bacteremia, hyperlipidemia, hypertension, lithium  toxicity, OSA on BiPAP, history of pneumonia, peptic ulcer disease, syncope, thrombocytopenia, UTI with hematuria, sepsis due to UTI, urinary catheter complication who underwent a robotic assisted sigmoidectomy with Dr. Sheldon and were seen for medical management.  The patient had transient hypotension and hypoxia shortly after being brought to PACU.  He is currently in no acute distress and his oxygen requirement has decreased.  Review of Systems: As mentioned in the history of present illness. All other systems reviewed and are negative. Past Medical History:  Diagnosis Date   Acute cystitis 05/13/2023   Acute hyponatremia 05/13/2023   Acute kidney injury superimposed on chronic kidney disease (HCC) 05/13/2023   CLINICAL CONTEXT: 73 year old male with stage 3 CKD, colovesical fistula, recurrent UTIs, and recent candidal UTI. Recent renal ultrasound (09/28/2023) confirmed mild left hydronephrosis and prostatic mass protruding into bladder base. Iron  studies consistent with anemia of chronic disease (iron  32, TIBC 238, ferritin 420).   DIAGNOSTIC ASSESSMENT: ? 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 History of GI bleeding (07/2023). Currently on ferrous sulfate  supplementation.   DIAGNOSTIC ASSESSMENT: ? Primary  etiology (75-80% probability):  Anemia of chronic disease/inflammation ? 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  EF 35 to 40% on echo 04/2021   Dementia (HCC)    Depression    Diarrhea 05/13/2023   E coli bacteremia 05/14/2023   High anion gap metabolic acidosis 07/13/2023   History of Clostridioides difficile colitis 08/15/2023   Associated with colovesical fistula? Recurrent antibiotic(s) requirement     Hyperkalemia    Associated with losartan   Advised patient low potassium diet 08/2022             Lab Results      Component    Value    Date/Time           K    4.0    12/27/2023 01:20 PM           K    4.4    12/18/2023 05:07 AM           K    4.4    12/17/2023 06:01 AM           K    4.7    12/16/2023 06:15 AM           K    4.9    12/15/2023 08:21 AM           K    4.4    12/14/2023 06:00 AM           K    5.0    Hyperlipidemia    Hypertension    Leukocytosis 12/11/2023   Lab Results      Component    Value    Date/Time           WBC    6.2    12/27/2023 01:20 PM           WBC    11.6 (H)    12/18/2023 05:07 AM           WBC    9.6    12/17/2023 06:01 AM           WBC    9.4    12/16/2023 06:15 AM           WBC    12.7 (H)    12/15/2023 08:21 AM           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  Work-up with cardiology in January 2021.     catheterization    Assessment:     1. Normal coronaries  2. NICM with EF 25-30%  3. Normal hemodynamics  .     Pneumonia    PUD (peptic ulcer disease) 01/12/2015   Formatting of this note might be different from the  original.  Endoscopy esophageal stricture 2016     Septic shock from UTI 06/02/2023   Severe sepsis (HCC) 06/03/2023   SIRS (systemic inflammatory response syndrome) (HCC) 12/11/2023   SKIN CANCER, HX OF 05/10/2007   Facial left check and forehead and r shoulder  F/w derm   Syncope 05/13/2023   Thrombocytopenia (HCC) 06/02/2023   Lab Results  Component    Value    Date           PLT    301    07/19/2023           PLT    324    07/18/2023           PLT    268    07/16/2023           PLT    207    07/14/2023           PLT    228    07/13/2023             Lab Results      Component    Value    Date           ESRSEDRATE    60 (H)    05/08/2023     No results found for: CRP          Lab Results      Component    Value       Urinary catheter complication (HCC) 06/24/2023   Urinary dribbling 03/14/2022   Urinary incontinence 11/21/2022      Urinary Incontinence: They are wearing disposable diapers daily due to urinary incontinence, primarily nocturnal, and are currently on Tamsulosin  (Flomax ). We will refer them to Urology for further evaluation and potential treatment options and continue Tamsulosin  until they are seen by Urology.     Urinary tract infection without hematuria 06/02/2023   UTI (urinary tract infection) 07/01/2023   Past Surgical History:  Procedure Laterality Date   COLONOSCOPY     COLONOSCOPY N/A 12/05/2023   Procedure: COLONOSCOPY;  Surgeon: Leigh Elspeth SQUIBB, MD;  Location: [facility] ENDOSCOPY;  Service: Gastroenterology;  Laterality: N/A;   RIGHT/LEFT HEART CATH AND CORONARY ANGIOGRAPHY N/A 07/03/2019   Procedure: RIGHT/LEFT HEART CATH AND CORONARY ANGIOGRAPHY;  Surgeon: Cherrie Toribio SAUNDERS, MD;  Location: [facility] INVASIVE CV LAB;  Service: Cardiovascular;  Laterality: N/A;   Social History:  reports that he has never smoked. He has never used smokeless tobacco. He reports that he does not currently use alcohol. He reports that he does not use drugs.  Allergies  Allergen Reactions    Albuterol  Palpitations    Supraventricular Tachycardia     Family History  Problem Relation Age of Onset   Hypertension Mother    Cancer Father    Bone cancer Sister     Prior to Admission medications   Medication Sig Start Date End Date Taking? Authorizing Provider  ARIPiprazole  (ABILIFY ) 2 MG tablet Take 1 tablet (2 mg total) by mouth in the morning. 08/22/23  Yes [name], MD  busPIRone  (BUSPAR ) 10 MG tablet Take 1 tablet (10 mg total) by mouth 3 (three) times daily. 09/16/23  Yes [name], MD  carvedilol  (COREG ) 6.25 MG tablet Take 1 tablet (6.25 mg total) by mouth 2 (two) times daily. 12/19/23  Yes Dennise Lavada POUR, MD  docusate sodium  (COLACE) 100 MG capsule Take 2 capsules (200 mg total) by mouth 2 (two) times daily as needed for mild constipation. 12/19/23  Yes Dennise Lavada POUR, MD  finasteride  (PROSCAR ) 5 MG tablet Take 5 mg by mouth daily. 11/06/23  Yes [provider]  folic acid  (FOLVITE ) 1 MG tablet Take 1 mg by mouth daily. 10/21/23  Yes [provider]  furosemide  (LASIX ) 40 MG tablet Take 1 tablet (40 mg total) by mouth daily as needed for fluid or  edema (For weight gain more than 2 pounds from your baseline over 24 hours). 12/19/23 12/18/24 Yes Dennise Lavada POUR, MD  gabapentin  (NEURONTIN ) 100 MG capsule TAKE 1 CAPSULE (100 MG TOTAL) BY MOUTH THREE TIMES DAILY. 01/04/24  Yes [name], MD  melatonin 3 MG TABS tablet Take 1 tablet (3 mg total) by mouth at bedtime as needed (insomnia). 07/19/23  Yes Rai, Ripudeep K, MD  mirtazapine  (REMERON ) 30 MG tablet Take 1 tablet (30 mg total) by mouth at bedtime. 09/16/23  Yes [name], MD  pravastatin  (PRAVACHOL ) 20 MG tablet Take 1 tablet (20 mg total) by mouth at bedtime. 12/19/22  Yes [name], MD  primidone  (MYSOLINE ) 50 MG tablet Take 1 tablet (50 mg total) by mouth 2 (two) times daily. 06/11/23  Yes Krishnan, Gokul, MD  tamsulosin  (FLOMAX ) 0.4 MG CAPS capsule TAKE 1 CAPSULE BY MOUTH  EVERY DAY 07/23/23  Yes [name], MD  traZODone  (DESYREL ) 100 MG tablet Take 1 tablet (100 mg total) by mouth at bedtime. 07/19/23  Yes Rai, Ripudeep K, MD  venlafaxine  XR (EFFEXOR -XR) 150 MG 24 hr capsule Take 150 mg by mouth daily. 10/04/23  Yes [provider]  venlafaxine  XR (EFFEXOR -XR) 75 MG 24 hr capsule Take 75 mg by mouth daily. 11/11/23  Yes [provider]    Physical Exam: Vitals:   01/09/24 0630 01/09/24 0653  BP: 128/89   Pulse: 96   Resp: 18   Temp: 97.9 F (36.6 C)   TempSrc: Oral   SpO2: 97%   Weight:  103.8 kg  Height:  6' (1.829 m)   Physical Exam Vitals reviewed.  Constitutional:      General: He is not in acute distress.    Appearance: He is obese. He is ill-appearing.     Interventions: Nasal cannula in place.  HENT:     Head: Normocephalic.     Nose: No rhinorrhea.  Eyes:     General: No scleral icterus.    Pupils: Pupils are equal, round, and reactive to light.  Neck:     Vascular: No JVD.  Cardiovascular:     Rate and Rhythm: Normal rate and regular rhythm.     Heart sounds: S1 normal and S2 normal.  Pulmonary:     Breath sounds: No wheezing, rhonchi or rales.  Abdominal:     Palpations: Abdomen is soft.     Tenderness: There is abdominal tenderness.  Musculoskeletal:     Cervical back: Neck supple.     Right lower leg: No edema.     Left lower leg: No edema.  Skin:    General: Skin is warm and dry.  Neurological:     General: No focal deficit present.     Mental Status: He is alert and oriented to person, place, and time.  Psychiatric:        Mood and Affect: Mood normal.        Behavior: Behavior normal. Behavior is cooperative.     Data Reviewed:   Results are pending, will review when available.   12/13/2023 echocardiogram report. IMPRESSION:   1. Left ventricular ejection fraction, by estimation, is 25 to 30%. The left ventricle has severely decreased function. The left ventricle demonstrates global  hypokinesis. There is mild left ventricular hypertrophy. Left ventricular diastolic parameters  are indeterminate.  2. Right ventricular systolic function is normal. The right ventricular size is normal. There is mildly elevated pulmonary artery systolic pressure. The estimated right ventricular  systolic pressure is 43.9 mmHg.  3. The mitral valve is normal in structure. Mild to moderate mitral valve regurgitation.  4. The aortic valve is tricuspid. Aortic valve regurgitation is not visualized. No aortic stenosis is present.  5. Aortic dilatation noted. There is dilatation of the ascending aorta, measuring 41 mm.  6. The inferior vena cava is dilated in size with <50% respiratory variability, suggesting right atrial pressure of 15 mmHg.  Family Communication:  Primary team communication:  Thank you very much for involving us  in the care of your patient.  Author: Alm Dorn Castor, MD 01/09/2024 8:37 AM  For on call review [email or web address].   This document was prepared using Dragon voice recognition software and may contain some unintended transcription errors.

## 2024-01-10 ENCOUNTER — Encounter (HOSPITAL_COMMUNITY): Payer: Self-pay | Admitting: Surgery

## 2024-01-10 DIAGNOSIS — N321 Vesicointestinal fistula: Secondary | ICD-10-CM | POA: Diagnosis not present

## 2024-01-10 LAB — GLUCOSE, CAPILLARY
Glucose-Capillary: 125 mg/dL — ABNORMAL HIGH (ref 70–99)
Glucose-Capillary: 119 mg/dL — ABNORMAL HIGH (ref 70–99)
Glucose-Capillary: 151 mg/dL — ABNORMAL HIGH (ref 70–99)
Glucose-Capillary: 126 mg/dL — ABNORMAL HIGH (ref 70–99)
Glucose-Capillary: 125 mg/dL — ABNORMAL HIGH (ref 70–99)

## 2024-01-10 LAB — CBC
HCT: 25.4 % — ABNORMAL LOW (ref 39.0–52.0)
HCT: 27 % — ABNORMAL LOW (ref 39.0–52.0)
Hemoglobin: 7.5 g/dL — ABNORMAL LOW (ref 13.0–17.0)
Hemoglobin: 8 g/dL — ABNORMAL LOW (ref 13.0–17.0)
MCH: 27.8 pg (ref 26.0–34.0)
MCH: 27.8 pg (ref 26.0–34.0)
MCHC: 29.5 g/dL — ABNORMAL LOW (ref 30.0–36.0)
MCHC: 29.6 g/dL — ABNORMAL LOW (ref 30.0–36.0)
MCV: 93.8 fL (ref 80.0–100.0)
MCV: 94.1 fL (ref 80.0–100.0)
Platelets: 148 K/uL — ABNORMAL LOW (ref 150–400)
Platelets: 157 K/uL (ref 150–400)
RBC: 2.7 MIL/uL — ABNORMAL LOW (ref 4.22–5.81)
RBC: 2.88 MIL/uL — ABNORMAL LOW (ref 4.22–5.81)
RDW: 14.8 % (ref 11.5–15.5)
RDW: 14.8 % (ref 11.5–15.5)
WBC: 11 K/uL — ABNORMAL HIGH (ref 4.0–10.5)
WBC: 9.1 K/uL (ref 4.0–10.5)
nRBC: 0 % (ref 0.0–0.2)
nRBC: 0 % (ref 0.0–0.2)

## 2024-01-10 LAB — BASIC METABOLIC PANEL WITH GFR
Anion gap: 8 (ref 5–15)
BUN: 16 mg/dL (ref 8–23)
CO2: 22 mmol/L (ref 22–32)
Calcium: 7.4 mg/dL — ABNORMAL LOW (ref 8.9–10.3)
Chloride: 104 mmol/L (ref 98–111)
Creatinine, Ser: 1.66 mg/dL — ABNORMAL HIGH (ref 0.61–1.24)
GFR, Estimated: 43 mL/min — ABNORMAL LOW (ref >60)
Glucose, Bld: 126 mg/dL — ABNORMAL HIGH (ref 70–99)
Potassium: 4.4 mmol/L (ref 3.5–5.1)
Sodium: 134 mmol/L — ABNORMAL LOW (ref 135–145)

## 2024-01-10 LAB — MAGNESIUM: Magnesium: 1.6 mg/dL — ABNORMAL LOW (ref 1.7–2.4)

## 2024-01-10 MED ORDER — CHLORHEXIDINE GLUCONATE CLOTH 2 % EX PADS
6.0000 | MEDICATED_PAD | Freq: Every day | CUTANEOUS | Status: DC
  Administered 2024-01-10 – 2024-01-14 (×6): 6 via TOPICAL

## 2024-01-10 MED ORDER — TAB-A-VITE/IRON PO TABS
1.0000 | ORAL_TABLET | Freq: Every day | ORAL | Status: DC
  Filled 2024-01-10: qty 1

## 2024-01-10 MED ORDER — LACTATED RINGERS IV BOLUS
500.0000 mL | Freq: Once | INTRAVENOUS | Status: AC
  Administered 2024-01-10: 500 mL via INTRAVENOUS

## 2024-01-10 MED ORDER — MAGNESIUM SULFATE 4 GM/100ML IV SOLN
4.0000 g | Freq: Once | INTRAVENOUS | Status: AC
  Administered 2024-01-10: 4 g via INTRAVENOUS
  Filled 2024-01-10: qty 100

## 2024-01-10 MED ORDER — SODIUM CHLORIDE 0.9 % IV SOLN
200.0000 mg | INTRAVENOUS | Status: DC
  Administered 2024-01-10: 200 mg via INTRAVENOUS
  Filled 2024-01-10: qty 10

## 2024-01-10 MED ORDER — ENOXAPARIN SODIUM 40 MG/0.4ML IJ SOSY
40.0000 mg | PREFILLED_SYRINGE | INTRAMUSCULAR | Status: DC
  Administered 2024-01-11 – 2024-01-14 (×6): 40 mg via SUBCUTANEOUS
  Filled 2024-01-10 (×4): qty 0.4

## 2024-01-10 NOTE — Progress Notes (Signed)
 01/10/2024  Calvin Escobar [id number] Mar 21, 1951  CARE TEAM: PCP: [name], MD  Outpatient Care Team: Patient Care Team: [name], MD as PCP - General (Internal Medicine) [name], MD as PCP - Cardiology (Cardiology) [name], MD (Dermatology) [facility] (Behavioral Health) Ezzard Rolin BIRCH, LCSW as VBCI Care Management (Licensed Clinical Social Worker) [name] (Psychiatry) [name], [name] LABOR, MD (Psychiatry) Legrand Victory LITTIE MOULD, MD as Consulting Physician (Gastroenterology) Sheldon Standing, MD as Consulting Physician (Colon and Rectal Surgery) Ezzard Rolin BIRCH KEN (Licensed Clinical Social Worker) Carolee Sherwood BIRCH MOULD, MD as Consulting Physician (Urology) Daneen Damien BROCKS, NP as Nurse Practitioner (Cardiology)  Inpatient Treatment Team: Treatment Team:  Sheldon Standing, MD [address], Amos File, RN Carolee Sherwood BIRCH MOULD, MD Massie Delaine SAUNDERS, RN Harvey Rosalene BRAVO, RN Burgess Dover, RN Tanda Pierce GAILS, RRT Suzanne Camie LABOR, RN Sharol Sheffield BRAVO, NT Joshua Delanna LITTIE, OT [name], MD [name], MD [name], PT [name], [name], RN [name], RN   Problem List:   Principal Problem:   Colovesical fistula Active Problems:   Bipolar affective disorder, depressed (HCC)   Dementia (HCC)   Chronic HFrEF (heart failure with reduced ejection fraction) (HCC)   Chronic bronchitis (HCC)   HLD (hyperlipidemia)   OSA treated with BiPAP   BPH (benign prostatic hyperplasia)   Primary insomnia   Hypertension   Chronic kidney disease, stage 3a (HCC)   Stricture of male urethra   Stricture of sigmoid colon (HCC)   History of COVID-19   History of urinary retention   Chronic indwelling Foley catheter   01/09/2024  POST-OPERATIVE DIAGNOSIS:   COLOVESICAL FISTULA WITH ABSCESS RECTOSIGMOID STRICTURE   PROCEDURE:   -ROBOTIC LOW ANTERIOR RECTOSIGMOID RESECTION (LAR) WITH  ANASTOMOSIS -COLOVESICAL FISTULA TAKEDOWN & REPAIR -DIVERTING LOOP ILEOSTOMY (DLI),  =DRAINAGE OF PELVIC ABSCESS -LYSIS OF ADHESIONS x45 MINUTES (30% OF CASE) -INTRAOPERATIVE ASSESSMENT OF TISSUE VASCULAR PERFUSION USING ICG (indocyanine green ) IMMUNOFLUORESCENCE -TRANSVERSUS ABDOMINIS PLANE (TAP) BLOCK - BILATERAL -FLEXIBLE SIGMOIDOSCOPY   SURGEON:  [name], MD  OR FINDINGS:   Patient had very thickened rectosigmoid colon folded and twisted upon itself with stricturing.  Extremely dense adhesions to the left dome of the bladder with 4 x 3 x 3 cm abscess.  Abscess decompressed.  No active persistent opening of the bladder on insufflation.   No obvious metastatic disease on visceral parietal peritoneum or liver.   It is a 31mm EEA anastomosis ( distal descending colon  connected to proximal rectum.)  It rests 17 cm from the anal verge by flexible sigmoidoscopy    Assessment [facility] Stay = 1 days) 1 Day Post-Op    Guarded but relatively stable   Plan:  -ERAS protocol  Advance diet gradually.  Try and keep on the dry side.  Consider colloid for volume replacement.  Follow-up on pathology.  Most likely consistent with diverticulitis with chronic stricture causing the colovesical fistula  Diverting loop ileostomy in place.  High risk for high output ileostomy but will have to see.  Usually can hold off on any diuretics that he usually is dependent upon on given the fact that he will have chronic diarrhea with that   Some acute postoperative blood loss anemia in the setting of chronic disease.  Well give IV iron  in the hopes of keeping hemoglobin from dropping and minimizing the chance of transfusion.  -monitor electrolytes & replace as needed.  Replace  magnesium  Keep K>4, Mg>2, Phos>3  History of hypertension on numerous medications.  Most likely will have to back off and hold parameters given his softer blood pressure.  See what medicine thinks.  Keep Foley catheter  given chronic urinary retention in the setting of urethral stricture and colovesical fistula per [facility].  Most likely they will consider outpatient bladder studies in 2-4 weeks from discharge.  Defer to Dr. Carolee  -VTE prophylaxis- SCDs.  Anticoagulation prophyllaxis SQ as appropriate  -mobilize as tolerated to help recovery.  Enlist therapies in moderate/high risk patients as appropriate.  Physical therapy and Occupational Therapy ordered.  Aside from some soft blood pressure I see no strong reason to keep him in the stepdown unit.  Will transfer to floor working to work on mobilizing carefully  [facility] internal medicine consultation to help follow given the patient's complexity.  I updated the patient's status to the patient and nurse  Recommendations were made.  Questions were answered.  They expressed understanding & appreciation.  -Disposition:  Disposition:  The patient is from: Home Anticipate discharge to:  Skilled Nursing Facility (SNF) Anticipated Date of Discharge is:  August 11,2025   Barriers to discharge:  Pending Clinical improvement (more likely than not), Therapy assessment & Recommendations pending, and Consultant clearance & sign off    Patient currently is NOT MEDICALLY STABLE for discharge from the hospital from a surgery standpoint.      I reviewed nursing notes, hospitalist notes, last 24 h vitals and pain scores, last 48 h intake and output, last 24 h labs and trends, and last 24 h imaging results.  I have reviewed this patient's available data, including medical history, events of note, test results, etc as part of my evaluation.   A significant portion of that time was spent in counseling. Care during the described time interval was provided by me.  This care required moderate level of medical decision making.  01/10/2024    Subjective: (Chief complaint)  Patient resting comfortably.  Denies much pain.  Stepdown nurse came into room with  me.  He has been tolerating some liquids.  Some soft blood pressure given bolus x 1  Objective:  Vital signs:  Vitals:   01/10/24 0635 01/10/24 0648 01/10/24 0649 01/10/24 0650  BP:  (!) 82/48    Pulse: 72 71 72 70  Resp: 13 12 15 13   Temp:      TempSrc:      SpO2: 100% 100% 100% 100%  Weight:      Height:        Last BM Date : 01/11/24  Intake/Output   Yesterday:  08/07 0701 - 08/08 0700 In: 3606.4 [I.V.:2875.7; IV Piggyback:730.7] Out: 1580 [Urine:1125; Drains:95; Stool:185; Blood:175] This shift:  No intake/output data recorded.  Bowel function:  Flatus: No  BM:  No  Drain: Serosanguinous-19 [place] drain goes into pelvis   Physical Exam:  General: Pt awake/alert in no acute distress Eyes: PERRL, normal EOM.  Sclera clear.  No icterus Neuro: CN II-XII intact w/o focal sensory/motor deficits. Lymph: No head/neck/groin lymphadenopathy Psych:  No delerium/psychosis/paranoia.  Oriented x 4 HENT: Normocephalic, Mucus membranes moist.  No thrush Neck: Supple, No tracheal deviation.  No obvious thyromegaly Chest: No pain to chest wall compression.  Good respiratory excursion.  No audible wheezing CV:  Pulses intact.  Regular rhythm.  No major extremity edema MS: Normal AROM mjr joints.  No obvious deformity  Abdomen: Soft.  Nondistended.  Mildly tender at incisions only.  No evidence of peritonitis.  No incarcerated hernias. Right infraumbilical paramedian diverting loop ileostomy in place.  Pink with some edema.  No flatus nor succus.  Ext:   No deformity.  No mjr edema.  No cyanosis Skin: No petechiae / purpurea.  No major sores.  Warm and dry    Results:   Cultures: Recent Results (from the past 720 hours)  Resp panel by RT-PCR (RSV, Flu A&B, Covid) Anterior Nasal Swab     Status: Abnormal   Collection Time: 12/11/23 12:29 PM   Specimen: Anterior Nasal Swab  Result Value Ref Range Status   SARS Coronavirus 2 by RT PCR POSITIVE (A) NEGATIVE Final    Influenza A by PCR NEGATIVE NEGATIVE Final   Influenza B by PCR NEGATIVE NEGATIVE Final    Comment: (NOTE) The Xpert Xpress SARS-CoV-2/FLU/RSV plus assay is intended as an aid in the diagnosis of influenza from Nasopharyngeal swab specimens and should not be used as a sole basis for treatment. Nasal washings and aspirates are unacceptable for Xpert Xpress SARS-CoV-2/FLU/RSV testing.  Fact Sheet for Patients: BloggerCourse.com  Fact Sheet for Healthcare Providers: SeriousBroker.it  This test is not yet approved or cleared by the [place]  FDA and has been authorized for detection and/or diagnosis of SARS-CoV-2 by FDA under an Emergency Use Authorization (EUA). This EUA will remain in effect (meaning this test can be used) for the duration of the COVID-19 declaration under Section 564(b)(1) of the Act, 21 U.S.C. section 360bbb-3(b)(1), unless the authorization is terminated or revoked.     Resp Syncytial Virus by PCR NEGATIVE NEGATIVE Final    Comment: (NOTE) Fact Sheet for Patients: BloggerCourse.com  Fact Sheet for Healthcare Providers: SeriousBroker.it  This test is not yet approved or cleared by the [place]  FDA and has been authorized for detection and/or diagnosis of SARS-CoV-2 by FDA under an Emergency Use Authorization (EUA). This EUA will remain in effect (meaning this test can be used) for the duration of the COVID-19 declaration under Section 564(b)(1) of the Act, 21 U.S.C. section 360bbb-3(b)(1), unless the authorization is terminated or revoked.  Performed at [facility], [address] [place], [place] [address]   Blood Culture (routine x 2)     Status: Abnormal   Collection Time: 12/11/23  1:30 PM   Specimen: BLOOD RIGHT FOREARM  Result Value Ref Range Status   Specimen Description BLOOD RIGHT FOREARM  Final   Special Requests   Final     BOTTLES DRAWN AEROBIC AND ANAEROBIC Blood Culture results may not be optimal due to an inadequate volume of blood received in culture bottles   Culture  Setup Time   Final    GRAM NEGATIVE RODS IN BOTH AEROBIC AND ANAEROBIC BOTTLES CRITICAL RESULT CALLED TO, READ BACK BY AND VERIFIED WITH: MAYA AGENT LEDFORD [id number] BY JINNY COMMON, MT Performed at [facility], [address] [place], [place] [address]    Culture ESCHERICHIA COLI (A)  Final   Report Status 12/14/2023 FINAL  Final   Organism ID, Bacteria ESCHERICHIA COLI  Final   Organism ID, Bacteria ESCHERICHIA COLI  Final      Susceptibility   Escherichia coli - KIRBY BAUER*    CEFAZOLIN SENSITIVE Sensitive    Escherichia coli - MIC*    AMPICILLIN  <=2 SENSITIVE Sensitive     CEFEPIME  <=0.12 SENSITIVE Sensitive     CEFTAZIDIME <=1 SENSITIVE Sensitive     CEFTRIAXONE  <=0.25 SENSITIVE Sensitive     CIPROFLOXACIN <=0.25  SENSITIVE Sensitive     GENTAMICIN <=1 SENSITIVE Sensitive     IMIPENEM <=0.25 SENSITIVE Sensitive     TRIMETH/SULFA <=20 SENSITIVE Sensitive     AMPICILLIN /SULBACTAM <=2 SENSITIVE Sensitive     PIP/TAZO <=4 SENSITIVE Sensitive ug/mL    * ESCHERICHIA COLI    ESCHERICHIA COLI  Blood Culture ID Panel (Reflexed)     Status: Abnormal   Collection Time: 12/11/23  1:30 PM  Result Value Ref Range Status   Enterococcus faecalis NOT DETECTED NOT DETECTED Final   Enterococcus Faecium NOT DETECTED NOT DETECTED Final   Listeria monocytogenes NOT DETECTED NOT DETECTED Final   Staphylococcus species NOT DETECTED NOT DETECTED Final   Staphylococcus aureus (BCID) NOT DETECTED NOT DETECTED Final   Staphylococcus epidermidis NOT DETECTED NOT DETECTED Final   Staphylococcus lugdunensis NOT DETECTED NOT DETECTED Final   Streptococcus species NOT DETECTED NOT DETECTED Final   Streptococcus agalactiae NOT DETECTED NOT DETECTED Final   Streptococcus pneumoniae NOT DETECTED NOT DETECTED Final   Streptococcus pyogenes NOT  DETECTED NOT DETECTED Final   A.calcoaceticus-baumannii NOT DETECTED NOT DETECTED Final   Bacteroides fragilis NOT DETECTED NOT DETECTED Final   Enterobacterales DETECTED (A) NOT DETECTED Final    Comment: Enterobacterales represent a large order of gram negative bacteria, not a single organism. CRITICAL RESULT CALLED TO, READ BACK BY AND VERIFIED WITH: PHARMD JAMES LEDFORD [id number] BY J RAZZAK, MT    Enterobacter cloacae complex NOT DETECTED NOT DETECTED Final   Escherichia coli DETECTED (A) NOT DETECTED Final    Comment: CRITICAL RESULT CALLED TO, READ BACK BY AND VERIFIED WITH: PHARMD JAMES LEDFORD [phone] [id number] BY J RAZZAK, MT    Klebsiella aerogenes NOT DETECTED NOT DETECTED Final   Klebsiella oxytoca NOT DETECTED NOT DETECTED Final   Klebsiella pneumoniae NOT DETECTED NOT DETECTED Final   Proteus species NOT DETECTED NOT DETECTED Final   Salmonella species NOT DETECTED NOT DETECTED Final   Serratia marcescens NOT DETECTED NOT DETECTED Final   Haemophilus influenzae NOT DETECTED NOT DETECTED Final   Neisseria meningitidis NOT DETECTED NOT DETECTED Final   Pseudomonas aeruginosa NOT DETECTED NOT DETECTED Final   Stenotrophomonas maltophilia NOT DETECTED NOT DETECTED Final   Candida albicans NOT DETECTED NOT DETECTED Final   Candida auris NOT DETECTED NOT DETECTED Final   Candida glabrata NOT DETECTED NOT DETECTED Final   Candida krusei NOT DETECTED NOT DETECTED Final   Candida parapsilosis NOT DETECTED NOT DETECTED Final   Candida tropicalis NOT DETECTED NOT DETECTED Final   Cryptococcus neoformans/gattii NOT DETECTED NOT DETECTED Final   CTX-M ESBL NOT DETECTED NOT DETECTED Final   Carbapenem resistance IMP NOT DETECTED NOT DETECTED Final   Carbapenem resistance KPC NOT DETECTED NOT DETECTED Final   Carbapenem resistance NDM NOT DETECTED NOT DETECTED Final   Carbapenem resist OXA 48 LIKE NOT DETECTED NOT DETECTED Final   Carbapenem resistance VIM NOT DETECTED NOT DETECTED  Final    Comment: Performed at [facility], [address] [place], [place] [address]  Blood Culture (routine x 2)     Status: None   Collection Time: 12/11/23  6:30 PM   Specimen: BLOOD  Result Value Ref Range Status   Specimen Description BLOOD SITE NOT SPECIFIED  Final   Special Requests   Final    BOTTLES DRAWN AEROBIC AND ANAEROBIC Blood Culture adequate volume   Culture   Final    NO GROWTH 5 DAYS Performed at [facility], [address],  [place], [place] [address]    Report Status 12/16/2023 FINAL  Final  Urine Culture     Status: Abnormal   Collection Time: 12/27/23  1:20 PM  Result Value Ref Range Status   MICRO NUMBER: [id number]  Final   SPECIMEN QUALITY: Adequate  Final   Sample Source URINE  Final   STATUS: FINAL  Final   ISOLATE 1: Enterococcus faecalis (A)  Final    Comment: 10,000-49,000 CFU/mL of Enterococcus faecalis      Susceptibility   Enterococcus faecalis - URINE CULTURE POSITIVE 1    AMPICILLIN  <=2 Sensitive     VANCOMYCIN  1 Sensitive     NITROFURANTOIN* <=16 Sensitive      * Legend: S = Susceptible  I = Intermediate R = Resistant  NS = Not susceptible SDD = Susceptible Dose Dependent * = Not Tested  NR = Not Reported **NN = See Therapy Comments   MRSA Next Gen by PCR, Nasal     Status: None   Collection Time: 01/09/24  3:37 PM   Specimen: Nasal Mucosa; Nasal Swab  Result Value Ref Range Status   MRSA by PCR Next Gen NOT DETECTED NOT DETECTED Final    Comment: (NOTE) The GeneXpert MRSA Assay (FDA approved for NASAL specimens only), is one component of a comprehensive MRSA colonization surveillance program. It is not intended to diagnose MRSA infection nor to guide or monitor treatment for MRSA infections. Test performance is not FDA approved in patients less than 7 years old. Performed at [name], [address], [place], [place] [address]     Labs: Results for orders placed or performed during the  hospital encounter of 01/09/24 (from the past 48 hours)  Glucose, capillary     Status: Abnormal   Collection Time: 01/09/24  2:43 PM  Result Value Ref Range   Glucose-Capillary 178 (H) 70 - 99 mg/dL    Comment: Glucose reference range applies only to samples taken after fasting for at least 8 hours.  CBC     Status: Abnormal   Collection Time: 01/09/24  3:24 PM  Result Value Ref Range   WBC 13.9 (H) 4.0 - 10.5 K/uL   RBC 3.30 (L) 4.22 - 5.81 MIL/uL   Hemoglobin 9.0 (L) 13.0 - 17.0 g/dL   HCT 69.7 (L) 60.9 - 47.9 %   MCV 91.5 80.0 - 100.0 fL   MCH 27.3 26.0 - 34.0 pg   MCHC 29.8 (L) 30.0 - 36.0 g/dL   RDW 85.3 88.4 - 84.4 %   Platelets 182 150 - 400 K/uL   nRBC 0.0 0.0 - 0.2 %    Comment: Performed at [facility], [address] [place], [place] [address]  Comprehensive metabolic panel     Status: Abnormal   Collection Time: 01/09/24  3:24 PM  Result Value Ref Range   Sodium 135 135 - 145 mmol/L   Potassium 4.1 3.5 - 5.1 mmol/L   Chloride 104 98 - 111 mmol/L   CO2 20 (L) 22 - 32 mmol/L   Glucose, Bld 161 (H) 70 - 99 mg/dL    Comment: Glucose reference range applies only to samples taken after fasting for at least 8 hours.   BUN 15 8 - 23 mg/dL   Creatinine, Ser 8.45 (H) 0.61 - 1.24 mg/dL   Calcium  8.2 (L) 8.9 - 10.3 mg/dL   Total Protein 6.6 6.5 - 8.1 g/dL   Albumin  3.1 (L) 3.5 - 5.0 g/dL   AST  18 15 - 41 U/L   ALT 10 0 - 44 U/L   Alkaline Phosphatase 73 38 - 126 U/L   Total Bilirubin 0.4 0.0 - 1.2 mg/dL   GFR, Estimated 47 (L) >60 mL/min    Comment: (NOTE) Calculated using the CKD-EPI Creatinine Equation (2021)    Anion gap 11 5 - 15    Comment: Performed at [facility], [address], [place], [place] [address]  Magnesium      Status: None   Collection Time: 01/09/24  3:24 PM  Result Value Ref Range   Magnesium  1.8 1.7 - 2.4 mg/dL    Comment: Performed at [facility], [address] [place], [place]  [address]  Phosphorus     Status: None   Collection Time: 01/09/24  3:24 PM  Result Value Ref Range   Phosphorus 3.5 2.5 - 4.6 mg/dL    Comment: Performed at [facility], [address] [place], [place] [address]  MRSA Next Gen by PCR, Nasal     Status: None   Collection Time: 01/09/24  3:37 PM   Specimen: Nasal Mucosa; Nasal Swab  Result Value Ref Range   MRSA by PCR Next Gen NOT DETECTED NOT DETECTED    Comment: (NOTE) The GeneXpert MRSA Assay (FDA approved for NASAL specimens only), is one component of a comprehensive MRSA colonization surveillance program. It is not intended to diagnose MRSA infection nor to guide or monitor treatment for MRSA infections. Test performance is not FDA approved in patients less than 9 years old. Performed at [facility], [address] [place], [place] [address]   Glucose, capillary     Status: Abnormal   Collection Time: 01/09/24  5:46 PM  Result Value Ref Range   Glucose-Capillary 157 (H) 70 - 99 mg/dL    Comment: Glucose reference range applies only to samples taken after fasting for at least 8 hours.  Glucose, capillary     Status: Abnormal   Collection Time: 01/09/24  9:45 PM  Result Value Ref Range   Glucose-Capillary 146 (H) 70 - 99 mg/dL    Comment: Glucose reference range applies only to samples taken after fasting for at least 8 hours.  CBC     Status: Abnormal   Collection Time: 01/10/24 12:46 AM  Result Value Ref Range   WBC 11.0 (H) 4.0 - 10.5 K/uL   RBC 2.88 (L) 4.22 - 5.81 MIL/uL   Hemoglobin 8.0 (L) 13.0 - 17.0 g/dL   HCT 72.9 (L) 60.9 - 47.9 %   MCV 93.8 80.0 - 100.0 fL   MCH 27.8 26.0 - 34.0 pg   MCHC 29.6 (L) 30.0 - 36.0 g/dL   RDW 85.1 88.4 - 84.4 %   Platelets 157 150 - 400 K/uL   nRBC 0.0 0.0 - 0.2 %    Comment: Performed at [facility], [address] [place], [place] [address]  Basic metabolic panel     Status: Abnormal   Collection Time: 01/10/24  4:34  AM  Result Value Ref Range   Sodium 134 (L) 135 - 145 mmol/L   Potassium 4.4 3.5 - 5.1 mmol/L   Chloride 104 98 - 111 mmol/L   CO2 22 22 - 32 mmol/L   Glucose, Bld 126 (H) 70 - 99 mg/dL    Comment: Glucose reference range applies only to samples taken after fasting for at least 8 hours.   BUN 16 8 - 23 mg/dL  Creatinine, Ser 1.66 (H) 0.61 - 1.24 mg/dL   Calcium  7.4 (L) 8.9 - 10.3 mg/dL   GFR, Estimated 43 (L) >60 mL/min    Comment: (NOTE) Calculated using the CKD-EPI Creatinine Equation (2021)    Anion gap 8 5 - 15    Comment: Performed at [facility], [address] [place], [place] [address]  CBC     Status: Abnormal   Collection Time: 01/10/24  4:34 AM  Result Value Ref Range   WBC 9.1 4.0 - 10.5 K/uL   RBC 2.70 (L) 4.22 - 5.81 MIL/uL   Hemoglobin 7.5 (L) 13.0 - 17.0 g/dL   HCT 74.5 (L) 60.9 - 47.9 %   MCV 94.1 80.0 - 100.0 fL   MCH 27.8 26.0 - 34.0 pg   MCHC 29.5 (L) 30.0 - 36.0 g/dL   RDW 85.1 88.4 - 84.4 %   Platelets 148 (L) 150 - 400 K/uL   nRBC 0.0 0.0 - 0.2 %    Comment: Performed at [facility], [address] [place], [place] [address]  Magnesium      Status: Abnormal   Collection Time: 01/10/24  4:34 AM  Result Value Ref Range   Magnesium  1.6 (L) 1.7 - 2.4 mg/dL    Comment: Performed at [facility], [address] [place], [place] [address]  Glucose, capillary     Status: Abnormal   Collection Time: 01/10/24  5:38 AM  Result Value Ref Range   Glucose-Capillary 125 (H) 70 - 99 mg/dL    Comment: Glucose reference range applies only to samples taken after fasting for at least 8 hours.    Imaging / Studies: DG Chest Port 1 View Result Date: 01/09/2024 CLINICAL DATA:  Short of breath EXAM: PORTABLE CHEST 1 VIEW COMPARISON:  12/14/2023 FINDINGS: Single frontal view of the chest demonstrates right internal jugular catheter tip overlying superior vena cava. Stable cardiac silhouette. No airspace disease,  effusion, or pneumothorax. No acute bony abnormalities. IMPRESSION: 1. No acute intrathoracic process. 2. Right internal jugular catheter as above. Electronically Signed   By: Ozell Daring M.D.   On: 01/09/2024 18:51    Medications / Allergies: per chart  Antibiotics: Anti-infectives (From admission, onward)    Start     Dose/Rate Route Frequency Ordered Stop   01/09/24 2200  cefoTEtan  (CEFOTAN ) 2 g in sodium chloride  0.9 % 100 mL IVPB        2 g 200 mL/hr over 30 Minutes Intravenous Every 12 hours 01/09/24 1427 01/09/24 2247   01/09/24 0715  cefoTEtan  (CEFOTAN ) 2 g in sodium chloride  0.9 % 100 mL IVPB        2 g 200 mL/hr over 30 Minutes Intravenous On call to O.R. 01/09/24 0700 01/09/24 1429         Note: Portions of this report may have been transcribed using voice recognition software. Every effort was made to ensure accuracy; however, inadvertent computerized transcription errors may be present.   Any transcriptional errors that result from this process are unintentional.    Elspeth KYM Schultze, MD, FACS, MASCRS Esophageal, Gastrointestinal & Colorectal Surgery Robotic and Minimally Invasive Surgery  [facility] [address] [place], [place] [address] [phone] Fax [phone] Main  CONTACT INFORMATION: Weekday (9AM-5PM): Call [facility] main office at [phone] Weeknight (5PM-9AM) or Weekend/Holiday: Check EPIC Web Links tab & use AMION (password  [facility]) for General Surgery [facility] coverage  Please, DO  NOT use SecureChat  (it is not reliable communication to reach operating surgeons & will lead to a delay in care).   Epic staff messaging available for outptient concerns needing 1-2 business day response.      01/10/2024  7:03 AM

## 2024-01-10 NOTE — Progress Notes (Signed)
 PROGRESS NOTE    Calvin Escobar  FMW:[id number] DOB: [birth year] DOA: 01/09/2024 PCP: [name], MD  Outpatient Specialists:     Brief Narrative:  Patient is a 73 year old male with past medical history significant for hyponatremia, hyperkalemia secondary to losartan  use, acute kidney injury, high anion gap acidosis, acute metabolic encephalopathy, respiratory failure with hypoxia, C. difficile colitis, chronic systolic congestive heart failure, nonischemic cardiomyopathy with normal coronaries in 2021, hyperlipidemia, hypertension, lithium  toxicity, OSA on BiPAP, thrombocytopenia and sepsis secondary to UTI.  Patient underwent robotic assisted sigmoidectomy on 01/09/2024.  Medical team was consulted to assist with medical management.  01/10/2024: Patient seen.  No new complaints.   Assessment & Plan:   Principal Problem:   Colovesical fistula Active Problems:   Bipolar affective disorder, depressed (HCC)   Chronic bronchitis (HCC)   Dementia (HCC)   HLD (hyperlipidemia)   OSA treated with BiPAP   BPH (benign prostatic hyperplasia)   Primary insomnia   Hypertension   Chronic kidney disease, stage 3a (HCC)   Chronic HFrEF (heart failure with reduced ejection fraction) (HCC)   Stricture of male urethra   Stricture of sigmoid colon (HCC)   History of COVID-19   History of urinary retention   Chronic indwelling Foley catheter   Stricture of sigmoid colon (HCC)   Colovesical fistula Status post robotic sigmoidectomy Continue postop management per primary team. Analgesics and antiemetics as needed.     OSA treated with BiPAP Hold BiPAP for now.     Chronic kidney disease, stage 3a (HCC) -Stable. - Serum creatinine of 1.66. - Continue to monitor renal function and electrolytes.       HLD (hyperlipidemia) Continue pravastatin  20 mg p.o. daily.     Dementia (HCC)   Bipolar affective disorder, depressed (HCC) Continue aripiprazole  2 mg every morning. Continue  venlafaxine  150 mg p.o. daily.     Primary insomnia Continue trazodone  100 mg p.o. daily. Continue melatonin 3 mg p.o. at bedtime as needed.     Hypertension Continue carvedilol  6.25 mg p.o. daily.     Chronic HFrEF (heart failure with reduced ejection fraction) (HCC) -Seems compensated. - Patient is known to the cardiology team.  Have low threshold to consult the cardiology team.     BPH (benign prostatic hyperplasia) Continue tamsulosin  0.4 mg p.o. daily. Continue finasteride  5 mg p.o. daily.     Stricture of male urethra   History of urinary retention   Chronic indwelling Foley catheter Continue to monitor clinically. Urology consult appreciated.     DVT prophylaxis: Subcutaneous Lovenox . Code Status: Full code. Family Communication:  Disposition Plan: Patient is inpatient.   Consultants:  [facility] is consulting for medical management.  Procedures:  Sigmoidectomy.  Antimicrobials:  None.   Subjective: No new complaints.  Objective: Vitals:   01/10/24 0650 01/10/24 0800 01/10/24 0900 01/10/24 1000  BP:  102/83 108/60 106/65  Pulse: 70 87 75 91  Resp: 13 16 16 18   Temp:  (!) 97.3 F (36.3 C)    TempSrc:  Oral    SpO2: 100% 100% 100% 92%  Weight:      Height:        Intake/Output Summary (Last 24 hours) at 01/10/2024 1033 Last data filed at 01/10/2024 1031 Gross per 24 hour  Intake 2716.36 ml  Output 1180 ml  Net 1536.36 ml   Filed Weights   01/09/24 0653 01/09/24 1428 01/10/24 0529  Weight: 103.8 kg 101.9 kg 103.3 kg    Examination:  General exam: Appears  calm and comfortable  Respiratory system: Clear to auscultation.  Cardiovascular system: S1 & S2 Gastrointestinal system: Abdomen is soft.   Central nervous system: Awake and alert.  Data Reviewed: I have personally reviewed following labs and imaging studies  CBC: Recent Labs  Lab 01/09/24 1524 01/10/24 0046 01/10/24 0434  WBC 13.9* 11.0* 9.1  HGB 9.0* 8.0* 7.5*  HCT 30.2* 27.0* 25.4*   MCV 91.5 93.8 94.1  PLT 182 157 148*   Basic Metabolic Panel: Recent Labs  Lab 01/09/24 1524 01/10/24 0434  NA 135 134*  K 4.1 4.4  CL 104 104  CO2 20* 22  GLUCOSE 161* 126*  BUN 15 16  CREATININE 1.54* 1.66*  CALCIUM  8.2* 7.4*  MG 1.8 1.6*  PHOS 3.5  --    GFR: Estimated Creatinine Clearance: 43.3 mL/min (A) (by C-G formula based on SCr of 1.66 mg/dL (H)). Liver Function Tests: Recent Labs  Lab 01/09/24 1524  AST 18  ALT 10  ALKPHOS 73  BILITOT 0.4  PROT 6.6  ALBUMIN  3.1*   No results for input(s): LIPASE, AMYLASE in the last 168 hours. No results for input(s): AMMONIA in the last 168 hours. Coagulation Profile: No results for input(s): INR, PROTIME in the last 168 hours. Cardiac Enzymes: No results for input(s): CKTOTAL, CKMB, CKMBINDEX, TROPONINI in the last 168 hours. BNP (last 3 results) No results for input(s): PROBNP in the last 8760 hours. HbA1C: No results for input(s): HGBA1C in the last 72 hours. CBG: Recent Labs  Lab 01/09/24 1443 01/09/24 1746 01/09/24 2145 01/10/24 0538 01/10/24 0757  GLUCAP 178* 157* 146* 125* 119*   Lipid Profile: No results for input(s): CHOL, HDL, LDLCALC, TRIG, CHOLHDL, LDLDIRECT in the last 72 hours. Thyroid  Function Tests: No results for input(s): TSH, T4TOTAL, FREET4, T3FREE, THYROIDAB in the last 72 hours. Anemia Panel: No results for input(s): VITAMINB12, FOLATE, FERRITIN, TIBC, IRON , RETICCTPCT in the last 72 hours. Urine analysis:    Component Value Date/Time   COLORURINE YELLOW 12/27/2023 1320   APPEARANCEUR CLEAR 12/27/2023 1320   LABSPEC 1.012 12/27/2023 1320   PHURINE 7.5 12/27/2023 1320   GLUCOSEU NEGATIVE 12/27/2023 1320   HGBUR 3+ (A) 12/27/2023 1320   BILIRUBINUR (A) 12/02/2023 0532    TEST NOT REPORTED DUE TO COLOR INTERFERENCE OF URINE PIGMENT   KETONESUR NEGATIVE 12/27/2023 1320   PROTEINUR 3+ (A) 12/27/2023 1320   UROBILINOGEN 0.2  09/12/2013 1446   NITRITE (A) 12/02/2023 0532    TEST NOT REPORTED DUE TO COLOR INTERFERENCE OF URINE PIGMENT   LEUKOCYTESUR (A) 12/02/2023 0532    TEST NOT REPORTED DUE TO COLOR INTERFERENCE OF URINE PIGMENT   Sepsis Labs: @LABRCNTIP (procalcitonin:4,lacticidven:4)  ) Recent Results (from the past 240 hours)  MRSA Next Gen by PCR, Nasal     Status: None   Collection Time: 01/09/24  3:37 PM   Specimen: Nasal Mucosa; Nasal Swab  Result Value Ref Range Status   MRSA by PCR Next Gen NOT DETECTED NOT DETECTED Final    Comment: (NOTE) The GeneXpert MRSA Assay (FDA approved for NASAL specimens only), is one component of a comprehensive MRSA colonization surveillance program. It is not intended to diagnose MRSA infection nor to guide or monitor treatment for MRSA infections. Test performance is not FDA approved in patients less than 18 years old. Performed at [facility], [address] [place], [place] [address]          Radiology Studies: [facility] Chest Port 1 View Result Date: 01/09/2024 CLINICAL  DATA:  Short of breath EXAM: PORTABLE CHEST 1 VIEW COMPARISON:  12/14/2023 FINDINGS: Single frontal view of the chest demonstrates right internal jugular catheter tip overlying superior vena cava. Stable cardiac silhouette. No airspace disease, effusion, or pneumothorax. No acute bony abnormalities. IMPRESSION: 1. No acute intrathoracic process. 2. Right internal jugular catheter as above. Electronically Signed   By: Ozell Daring M.D.   On: 01/09/2024 18:51        Scheduled Meds:  acetaminophen   1,000 mg Oral Q6H   alvimopan   12 mg Oral BID   ARIPiprazole   2 mg Oral q AM   busPIRone   10 mg Oral TID   carvedilol   6.25 mg Oral BID   Chlorhexidine  Gluconate Cloth  6 each Topical Daily   [START ON 01/11/2024] enoxaparin  (LOVENOX ) injection  40 mg Subcutaneous Q24H   feeding supplement  237 mL Oral BID BM   finasteride   5 mg Oral Daily   folic acid   1 mg Oral Daily    gabapentin   100 mg Oral TID   insulin  aspart  0-15 Units Subcutaneous TID WC   insulin  aspart  0-5 Units Subcutaneous QHS   mirtazapine   30 mg Oral QHS   [START ON 01/11/2024] multivitamins with iron   1 tablet Oral Daily   polycarbophil  625 mg Oral BID   pravastatin   20 mg Oral QHS   primidone   50 mg Oral BID   sodium chloride  flush  3 mL Intravenous Q12H   tamsulosin   0.4 mg Oral Daily   traZODone   100 mg Oral QHS   venlafaxine  XR  150 mg Oral Daily   Continuous Infusions:  sodium chloride      albumin  human Stopped (01/10/24 0507)   iron  sucrose Stopped (01/10/24 1030)   lactated ringers        LOS: 1 day    Time spent: 35 minutes.    Leatrice Chapel, MD  [facility] Pager #: [phone] 7PM-7AM contact night coverage as above

## 2024-01-10 NOTE — Progress Notes (Signed)
     Patient Name: Calvin Escobar           DOB: Sep 07, 1950  MRN: [id number]      Admission Date: 01/09/2024  Attending Provider: Sheldon Standing, MD  Primary Diagnosis: Colovesical fistula   Level of care: Stepdown   OVERNIGHT PROGRESS REPORT   SBP 80s, transient-  cuff and Aline correlating All other vital stable.  Afebrile.  No acute changes.  Patient asymptomatic. Underwent robotic sigmoidectomy on 8/7 with EBL 175 cc. Last hemoglobin 9.0.  No signs of overt bleeding reported by bedside RN. Will repeat CBC.   During PACU, SBP also dropped to 80s per anesthesia note.     Plan:  Administer 500 cc bolus Check CBC  Addendum-  BP 103/49, improved Hgb 9 --> 8         Lavanda Horns, DNP, ACNPC- AG [facility] [place]

## 2024-01-10 NOTE — Consult Note (Addendum)
 WOC Nurse ostomy consult note First visit PO. Stoma type/location: RLQ ileostomy. Stomal assessment/size: 38 mm round, red and viable. Above the skin level. Edematous. Peristomal assessment: intact, slight crease on 3 and 9 o'clock. Treatment options for stomal/peristomal skin: Ring 2 inch [id number] Output aprox. 50 ml of brown/dark green stools, gas. Ostomy pouching: 2pc. Soft convex 2 1/4 #[id number] and bag #[id number]. Ring [id number].  Education provided: Provide some educational instructions to the patient, he was drowsy and insisted on involving his wife. The bed nurse will provide me her contact phone number per pt request.   Enrolled patient in [facility] DC program: Yes today.  WOC team will follow MON. Please reconsult if further assistance is needed. Thank-you,  Lela Holm BSN, CNS, RN, [employer], WOCN  (Phone [phone])

## 2024-01-10 NOTE — Evaluation (Signed)
 Physical Therapy Evaluation Patient Details Name: Calvin Escobar MRN: [id number] DOB: May 16, 1951 Today's Date: 01/10/2024  History of Present Illness  73 y.o. male admitted 01/09/24 with colovesical fistula, s/p rectosigmoid resection, LOA, Ileostomy. Pt with recent admission to [facility] on 7/9-7/17/25 with tachycardia, tachypnea and cough. + COVID and ecoli bacteremia, Other recent admission 6/30-12/07/23 for hematuria--went home with foley catheter. PMH:  hyperlipidemia, systolic heart failure, history of colovesical fistula urinary retention anemia of chronic disease, bipolar disorder, dementia.  Clinical Impression  Pt admitted with above diagnosis. Instructed pt in log roll technique for bed mobility. Min A to roll, mod A for sidelying to sit. Once sitting at edge of bed, HR elevated to 160, so pt was assisted back to bed. Pt reported significant pain, pain medication requested.  Pt currently with functional limitations due to the deficits listed below (see PT Problem List). Pt will benefit from acute skilled PT to increase their independence and safety with mobility to allow discharge.           If plan is discharge home, recommend the following: A lot of help with walking and/or transfers;A lot of help with bathing/dressing/bathroom;Assistance with cooking/housework;Assist for transportation;Help with stairs or ramp for entrance   Can travel by private vehicle   No    Equipment Recommendations None recommended by PT  Recommendations for Other Services       Functional Status Assessment Patient has had a recent decline in their functional status and demonstrates the ability to make significant improvements in function in a reasonable and predictable amount of time.     Precautions / Restrictions Precautions Precautions: Fall Recall of Precautions/Restrictions: Impaired Precaution/Restrictions Comments: abdominal incision, RLQ ileostomy; monitor HR Restrictions Weight Bearing  Restrictions Per Provider Order: No      Mobility  Bed Mobility   Bed Mobility: Rolling, Sidelying to Sit Rolling: Min assist Sidelying to sit: Mod assist       General bed mobility comments: VCs for log roll, mod A to raise trunk; pt sat EOB with good sitting balance, HR up to 160 while sitting so returned to supine    Transfers                        Ambulation/Gait               General Gait Details: deferred 2* elevated HR  Stairs            Wheelchair Mobility     Tilt Bed    Modified Rankin (Stroke Patients Only)       Balance   Sitting-balance support: Feet supported, No upper extremity supported Sitting balance-Leahy Scale: Good                                       Pertinent Vitals/Pain Pain Assessment Pain Assessment: 0-10 Pain Score: 6  Pain Location: abdominal incision Pain Descriptors / Indicators: Sore, Grimacing, Guarding Pain Intervention(s): Limited activity within patient's tolerance, Monitored during session, Repositioned, Patient requesting pain meds-RN notified    Home Living Family/patient expects to be discharged to:: Private residence Living Arrangements: Spouse/significant other;Children Available Help at Discharge: Family;Available PRN/intermittently (family works opposite shifts) Type of Home: House Home Access: Stairs to enter Entrance Stairs-Rails: None Entrance Stairs-Number of Steps: 8-9 Alternate Level Stairs-Number of Steps: flight Home Layout: Two level;Bed/bath upstairs Home Equipment: Agricultural consultant (2 wheels) Additional Comments:  Pt information was from chart but pt also reported as well    Prior Function Prior Level of Function : Independent/Modified Independent;Patient poor historian/Family not available             Mobility Comments: prior to recent hospitalizations pt was independent without AD, drives ADLs Comments: spouse does meds management, finances, pt makes  himself hot dogs when his family is not there, independent in self care     Extremity/Trunk Assessment   Upper Extremity Assessment Upper Extremity Assessment: Defer to OT evaluation    Lower Extremity Assessment Lower Extremity Assessment: Overall WFL for tasks assessed    Cervical / Trunk Assessment Cervical / Trunk Assessment: Kyphotic  Communication   Communication Communication: No apparent difficulties    Cognition Arousal: Alert Behavior During Therapy: Flat affect   PT - Cognitive impairments: No family/caregiver present to determine baseline, Safety/Judgement, History of cognitive impairments, Memory                       PT - Cognition Comments: pt oriented to self only Following commands: Intact       Cueing Cueing Techniques: Verbal cues     General Comments      Exercises     Assessment/Plan    PT Assessment Patient needs continued PT services  PT Problem List Decreased activity tolerance;Decreased mobility;Cardiopulmonary status limiting activity;Pain;Obesity       PT Treatment Interventions DME instruction;Gait training;Functional mobility training;Therapeutic activities;Therapeutic exercise;Balance training;Cognitive remediation;Patient/family education    PT Goals (Current goals can be found in the Care Plan section)  Acute Rehab PT Goals Patient Stated Goal: get stronger PT Goal Formulation: With patient Time For Goal Achievement: 01/24/24 Potential to Achieve Goals: Fair    Frequency Min 3X/week     Co-evaluation               AM-PAC PT 6 Clicks Mobility  Outcome Measure Help needed turning from your back to your side while in a flat bed without using bedrails?: A Little Help needed moving from lying on your back to sitting on the side of a flat bed without using bedrails?: A Lot Help needed moving to and from a bed to a chair (including a wheelchair)?: A Lot Help needed standing up from a chair using your arms  (e.g., wheelchair or bedside chair)?: A Lot Help needed to walk in hospital room?: Total Help needed climbing 3-5 steps with a railing? : Total 6 Click Score: 11    End of Session   Activity Tolerance: Treatment limited secondary to medical complications (Comment) (elevated HR) Patient left: with call bell/phone within reach;in bed;with bed alarm set;with nursing/sitter in room Nurse Communication: Mobility status PT Visit Diagnosis: Unsteadiness on feet (R26.81);Other abnormalities of gait and mobility (R26.89);Muscle weakness (generalized) (M62.81);Difficulty in walking, not elsewhere classified (R26.2);Pain    Time: 8873-8850 PT Time Calculation (min) (ACUTE ONLY): 23 min   Charges:   PT Evaluation $PT Eval Moderate Complexity: 1 Mod PT Treatments $Therapeutic Activity: 8-22 mins PT General Charges $$ ACUTE PT VISIT: 1 Visit         Calvin Escobar PT 01/10/2024  Acute Rehabilitation Services  Office [phone]

## 2024-01-10 NOTE — Evaluation (Addendum)
 Occupational Therapy Evaluation Patient Details Name: Calvin Escobar MRN: [id number] DOB: [birth year] Today's Date: 01/10/2024   History of Present Illness   72 yr old male admitted 01/09/24 with colovesical fistula, s/p rectosigmoid resection, ileostomy, and drainage of abscess. Pt with recent admission to [facility] on 7/9-7/17/25 with tachycardia, tachypnea and cough. + COVID and ecoli bacteremia, Other recent admission 6/30-12/07/23 for hematuria--went home with foley catheter. PMH:  hyperlipidemia, systolic heart failure, history of colovesical fistula urinary retention anemia of chronic disease, bipolar disorder, dementia.     Clinical Impressions The pt is currently presenting with the below listed deficits (see OT problem list). As such, his occupational performance is compromised and he requires assist for ADL management. During the session, he required min assist for rolling in bed, set-up assist for simple self-feeding, min assist to scoot to the head of the bed, and mod assist for simulated lower body dressing. He reported having pretty rough lower abdominal pain, with associated reports of soreness and guarding of movements. He was also noted to be with disorientation to the month and to the city, as he stated  he was in [place] . Pt stated, my mind is gone berserk. He had intermittent difficulty with memory/recall. He will benefit from further OT services to maximize his safety and independence with self-care tasks. Patient will benefit from continued inpatient follow up therapy, <3 hours/day.      If plan is discharge home, recommend the following:   Assistance with cooking/housework;Help with stairs or ramp for entrance;Assist for transportation;A little help with walking and/or transfers;A lot of help with bathing/dressing/bathroom     Functional Status Assessment   Patient has had a recent decline in their functional status and demonstrates the ability to make significant  improvements in function in a reasonable and predictable amount of time.     Equipment Recommendations   Other (comment) (defer to next level of care)     Recommendations for Other Services         Precautions/Restrictions   Precautions Precautions: Fall Precaution/Restrictions Comments: abdominal incision, RLQ ileostomy Restrictions Weight Bearing Restrictions Per Provider Order: No     Mobility Bed Mobility   Bed Mobility: Rolling Rolling: Min assist         General bed mobility comments:  (He further required min assist to scoot to the head of the bed.)            ADL either performed or assessed with clinical judgement   ADL Overall ADL's : Needs assistance/impaired Eating/Feeding: Set up;Bed level   Grooming: Set up;Supervision/safety;Bed level   Upper Body Bathing: Set up;Supervision/ safety;Bed level   Lower Body Bathing: Minimal assistance;Moderate assistance;Bed level   Upper Body Dressing : Minimal assistance;Bed level   Lower Body Dressing: Moderate assistance;Bed level                       Vision Baseline Vision/History: 1 Wears glasses              Pertinent Vitals/Pain Pain Assessment Pain Assessment: 0-10 Pain Score:  (he reported lower abdominal pain to be pretty rough) Pain Descriptors / Indicators: Sore, Grimacing, Guarding Pain Intervention(s): Limited activity within patient's tolerance, Monitored during session, Repositioned     Extremity/Trunk Assessment Upper Extremity Assessment Upper Extremity Assessment: RUE deficits/detail;LUE deficits/detail RUE Deficits / Details: AROM WFL. Grip strength 4+/5 LUE Deficits / Details: AROM WFL. Grip strength 4+/5   Lower Extremity Assessment Lower Extremity Assessment: RLE deficits/detail;LLE deficits/detail RLE  Deficits / Details: AROM WFL LLE Deficits / Details: AROM WFL       Communication     Cognition Arousal: Alert   Cognition: No family/caregiver  present to determine baseline             OT - Cognition Comments: Oriented to person and year. Partly oriented to situation. Disoriented to month and he reported being in [place] . Occasional difficulty with cognitive processing. Able to follow simple 1 step commands. Intermittent difficulty with memory/recall.              Cueing  General Comments   Cueing Techniques: Verbal cues              Home Living Family/patient expects to be discharged to:: Private residence Living Arrangements: Spouse/significant other;Children (He stated he lives with his spouse and son. His son is a Investment banker, operational.) Available Help at Discharge: Family Type of Home: House       Home Layout: Two level        Additional Comments:  (Info contained here and reported by the pt should be verified, as he was a questionable historian at times.)      Prior Functioning/Environment Prior Level of Function : Patient poor historian/Family not available;Independent/Modified Independent             Mobility Comments:  (He ambulated without an assistive device.) ADLs Comments:  (Pt reported being independent with ADLs and his spouse managed the cooking and cleaning.)    OT Problem List: Impaired balance (sitting and/or standing);Decreased strength;Decreased knowledge of use of DME or AE;Decreased activity tolerance;Pain;Decreased cognition   OT Treatment/Interventions: Self-care/ADL training;Balance training;Therapeutic activities;DME and/or AE instruction;Patient/family education;Therapeutic exercise;Energy conservation;Cognitive remediation/compensation      OT Goals(Current goals can be found in the care plan section)   Acute Rehab OT Goals OT Goal Formulation: With patient Time For Goal Achievement: 01/24/24 Potential to Achieve Goals: Good ADL Goals Pt Will Perform Grooming: with supervision;standing Pt Will Perform Lower Body Dressing: with supervision;sitting/lateral leans;sit to/from  stand Pt Will Transfer to Toilet: with supervision;ambulating Pt Will Perform Toileting - Clothing Manipulation and hygiene: with supervision;sit to/from stand   OT Frequency:  Min 2X/week       AM-PAC OT 6 Clicks Daily Activity     Outcome Measure Help from another person eating meals?: A Little Help from another person taking care of personal grooming?: A Little Help from another person toileting, which includes using toliet, bedpan, or urinal?: A Lot Help from another person bathing (including washing, rinsing, drying)?: A Lot Help from another person to put on and taking off regular upper body clothing?: A Little Help from another person to put on and taking off regular lower body clothing?: A Lot 6 Click Score: 15   End of Session Equipment Utilized During Treatment: Other (comment) (N/A) Nurse Communication: Mobility status  Activity Tolerance: Patient tolerated treatment well Patient left: in bed;with bed alarm set;with call bell/phone within reach  OT Visit Diagnosis: Muscle weakness (generalized) (M62.81);Other abnormalities of gait and mobility (R26.89);Other symptoms and signs involving cognitive function                Time: 8490-8465 OT Time Calculation (min): 25 min Charges:  OT General Charges $OT Visit: 1 Visit OT Evaluation $OT Eval Moderate Complexity: 1 Mod    Cella Cappello L Avilyn Virtue, OTR/L 01/10/2024, 5:12 PM

## 2024-01-11 DIAGNOSIS — N321 Vesicointestinal fistula: Secondary | ICD-10-CM | POA: Diagnosis not present

## 2024-01-11 DIAGNOSIS — Z932 Ileostomy status: Secondary | ICD-10-CM

## 2024-01-11 HISTORY — DX: Ileostomy status: Z93.2

## 2024-01-11 LAB — CBC
HCT: 25.6 % — ABNORMAL LOW (ref 39.0–52.0)
Hemoglobin: 7.3 g/dL — ABNORMAL LOW (ref 13.0–17.0)
MCH: 27.1 pg (ref 26.0–34.0)
MCHC: 28.5 g/dL — ABNORMAL LOW (ref 30.0–36.0)
MCV: 95.2 fL (ref 80.0–100.0)
Platelets: 167 K/uL (ref 150–400)
RBC: 2.69 MIL/uL — ABNORMAL LOW (ref 4.22–5.81)
RDW: 14.8 % (ref 11.5–15.5)
WBC: 9.5 K/uL (ref 4.0–10.5)
nRBC: 0 % (ref 0.0–0.2)

## 2024-01-11 LAB — CREATININE, SERUM
Creatinine, Ser: 1.51 mg/dL — ABNORMAL HIGH (ref 0.61–1.24)
GFR, Estimated: 48 mL/min — ABNORMAL LOW (ref >60)

## 2024-01-11 LAB — GLUCOSE, CAPILLARY
Glucose-Capillary: 104 mg/dL — ABNORMAL HIGH (ref 70–99)
Glucose-Capillary: 142 mg/dL — ABNORMAL HIGH (ref 70–99)
Glucose-Capillary: 143 mg/dL — ABNORMAL HIGH (ref 70–99)
Glucose-Capillary: 121 mg/dL — ABNORMAL HIGH (ref 70–99)

## 2024-01-11 LAB — POTASSIUM: Potassium: 4.3 mmol/L (ref 3.5–5.1)

## 2024-01-11 MED ORDER — FERROUS SULFATE 325 (65 FE) MG PO TABS
325.0000 mg | ORAL_TABLET | Freq: Two times a day (BID) | ORAL | Status: DC
  Administered 2024-01-11 – 2024-01-14 (×9): 325 mg via ORAL
  Filled 2024-01-11 (×6): qty 1

## 2024-01-11 MED ORDER — ADULT MULTIVITAMIN W/MINERALS CH
1.0000 | ORAL_TABLET | Freq: Every day | ORAL | Status: DC
  Administered 2024-01-11 – 2024-01-14 (×6): 1 via ORAL
  Filled 2024-01-11 (×4): qty 1

## 2024-01-11 NOTE — Progress Notes (Signed)
 PROGRESS NOTE    Calvin Escobar  FMW:[id number] DOB: 1951/04/16 DOA: 01/09/2024 PCP: [name], MD  Outpatient Specialists:     Brief Narrative:  Patient is a 73 year old male with past medical history significant for hyponatremia, hyperkalemia secondary to losartan  use, acute kidney injury, high anion gap acidosis, acute metabolic encephalopathy, respiratory failure with hypoxia, C. difficile colitis, chronic systolic congestive heart failure, nonischemic cardiomyopathy with normal coronaries in 2021, hyperlipidemia, hypertension, lithium  toxicity, OSA on BiPAP, thrombocytopenia and sepsis secondary to UTI.  Patient underwent robotic assisted sigmoidectomy on 01/09/2024.  Medical team was consulted to assist with medical management.  01/11/2024: Patient seen.  No new complaints.  Patient looks better today.  PT/OT recommended SNF rehab   Assessment & Plan:   Principal Problem:   Colovesical fistula Active Problems:   Bipolar affective disorder, depressed (HCC)   Chronic bronchitis (HCC)   Dementia (HCC)   HLD (hyperlipidemia)   OSA treated with BiPAP   BPH (benign prostatic hyperplasia)   Primary insomnia   Hypertension   Chronic kidney disease, stage 3a (HCC)   Chronic HFrEF (heart failure with reduced ejection fraction) (HCC)   Stricture of male urethra   Stricture of sigmoid colon (HCC)   History of COVID-19   History of urinary retention   Chronic indwelling Foley catheter   Ileostomy in place [facility])   Stricture of sigmoid colon (HCC)   Colovesical fistula Status post robotic sigmoidectomy Continue postop management per primary team. Analgesics and antiemetics as needed.     OSA treated with BiPAP Hold BiPAP for now.     Chronic kidney disease, stage 3a (HCC) -Stable. - Serum creatinine is improving.  Serum creatinine of 1.51 today.  - Continue to monitor renal function and electrolytes.       HLD (hyperlipidemia) Continue pravastatin  20 mg p.o. daily.      Dementia (HCC)   Bipolar affective disorder, depressed (HCC) Continue aripiprazole  2 mg every morning. Continue venlafaxine  150 mg p.o. daily.     Primary insomnia Discontinue trazodone  100 mg p.o. daily. Continue melatonin 3 mg p.o. at bedtime as needed.     Hypertension Continue carvedilol  6.25 mg p.o. daily.     Chronic HFrEF (heart failure with reduced ejection fraction) (HCC) -Seems compensated. - Patient is known to the cardiology team.  Have low threshold to consult the cardiology team.     BPH (benign prostatic hyperplasia) Continue tamsulosin  0.4 mg p.o. daily. Continue finasteride  5 mg p.o. daily.     Stricture of male urethra   History of urinary retention   Chronic indwelling Foley catheter Continue to monitor clinically. Urology consult appreciated.     DVT prophylaxis: Subcutaneous Lovenox . Code Status: Full code. Family Communication:  Disposition Plan: Patient is inpatient.   Consultants:  [facility] is consulting for medical management.  Procedures:  Sigmoidectomy.  Antimicrobials:  None.   Subjective: No new complaints.  Objective: Vitals:   01/10/24 2147 01/11/24 0120 01/11/24 0509 01/11/24 1206  BP:  111/67 104/68 124/73  Pulse:  84 95 96  Resp:  16 20 15   Temp:  99.4 F (37.4 C) 98.8 F (37.1 C) 98.1 F (36.7 C)  TempSrc:  Oral Oral Oral  SpO2: 96% 92% 95% 94%  Weight:      Height:        Intake/Output Summary (Last 24 hours) at 01/11/2024 1728 Last data filed at 01/11/2024 1519 Gross per 24 hour  Intake 483 ml  Output 2625 ml  Net -2142 ml  Filed Weights   01/09/24 0653 01/09/24 1428 01/10/24 0529  Weight: 103.8 kg 101.9 kg 103.3 kg    Examination:  General exam: Appears calm and comfortable  Respiratory system: Clear to auscultation.  Cardiovascular system: S1 & S2 Gastrointestinal system: Abdomen is soft.   Central nervous system: Awake and alert.  Data Reviewed: I have personally reviewed following labs and imaging  studies  CBC: Recent Labs  Lab 01/09/24 1524 01/10/24 0046 01/10/24 0434 01/11/24 1310  WBC 13.9* 11.0* 9.1 9.5  HGB 9.0* 8.0* 7.5* 7.3*  HCT 30.2* 27.0* 25.4* 25.6*  MCV 91.5 93.8 94.1 95.2  PLT 182 157 148* 167   Basic Metabolic Panel: Recent Labs  Lab 01/09/24 1524 01/10/24 0434 01/11/24 1310  NA 135 134*  --   K 4.1 4.4 4.3  CL 104 104  --   CO2 20* 22  --   GLUCOSE 161* 126*  --   BUN 15 16  --   CREATININE 1.54* 1.66* 1.51*  CALCIUM  8.2* 7.4*  --   MG 1.8 1.6*  --   PHOS 3.5  --   --    GFR: Estimated Creatinine Clearance: 47.6 mL/min (A) (by C-G formula based on SCr of 1.51 mg/dL (H)). Liver Function Tests: Recent Labs  Lab 01/09/24 1524  AST 18  ALT 10  ALKPHOS 73  BILITOT 0.4  PROT 6.6  ALBUMIN  3.1*   No results for input(s): LIPASE, AMYLASE in the last 168 hours. No results for input(s): AMMONIA in the last 168 hours. Coagulation Profile: No results for input(s): INR, PROTIME in the last 168 hours. Cardiac Enzymes: No results for input(s): CKTOTAL, CKMB, CKMBINDEX, TROPONINI in the last 168 hours. BNP (last 3 results) No results for input(s): PROBNP in the last 8760 hours. HbA1C: No results for input(s): HGBA1C in the last 72 hours. CBG: Recent Labs  Lab 01/10/24 1702 01/10/24 2121 01/11/24 0828 01/11/24 1158 01/11/24 1707  GLUCAP 126* 125* 104* 142* 143*   Lipid Profile: No results for input(s): CHOL, HDL, LDLCALC, TRIG, CHOLHDL, LDLDIRECT in the last 72 hours. Thyroid  Function Tests: No results for input(s): TSH, T4TOTAL, FREET4, T3FREE, THYROIDAB in the last 72 hours. Anemia Panel: No results for input(s): VITAMINB12, FOLATE, FERRITIN, TIBC, IRON , RETICCTPCT in the last 72 hours. Urine analysis:    Component Value Date/Time   COLORURINE YELLOW 12/27/2023 1320   APPEARANCEUR CLEAR 12/27/2023 1320   LABSPEC 1.012 12/27/2023 1320   PHURINE 7.5 12/27/2023 1320   GLUCOSEU  NEGATIVE 12/27/2023 1320   HGBUR 3+ (A) 12/27/2023 1320   BILIRUBINUR (A) 12/02/2023 0532    TEST NOT REPORTED DUE TO COLOR INTERFERENCE OF URINE PIGMENT   KETONESUR NEGATIVE 12/27/2023 1320   PROTEINUR 3+ (A) 12/27/2023 1320   UROBILINOGEN 0.2 09/12/2013 1446   NITRITE (A) 12/02/2023 0532    TEST NOT REPORTED DUE TO COLOR INTERFERENCE OF URINE PIGMENT   LEUKOCYTESUR (A) 12/02/2023 0532    TEST NOT REPORTED DUE TO COLOR INTERFERENCE OF URINE PIGMENT   Sepsis Labs: @LABRCNTIP (procalcitonin:4,lacticidven:4)  ) Recent Results (from the past 240 hours)  MRSA Next Gen by PCR, Nasal     Status: None   Collection Time: 01/09/24  3:37 PM   Specimen: Nasal Mucosa; Nasal Swab  Result Value Ref Range Status   MRSA by PCR Next Gen NOT DETECTED NOT DETECTED Final    Comment: (NOTE) The GeneXpert MRSA Assay (FDA approved for NASAL specimens only), is one component of a comprehensive MRSA colonization surveillance program. It is  not intended to diagnose MRSA infection nor to guide or monitor treatment for MRSA infections. Test performance is not FDA approved in patients less than 84 years old. Performed at [facility], [address] [place], [place] [address]          Radiology Studies: No results found.       Scheduled Meds:  acetaminophen   1,000 mg Oral Q6H   alvimopan   12 mg Oral BID   ARIPiprazole   2 mg Oral q AM   busPIRone   10 mg Oral TID   carvedilol   6.25 mg Oral BID   Chlorhexidine  Gluconate Cloth  6 each Topical Daily   enoxaparin  (LOVENOX ) injection  40 mg Subcutaneous Q24H   feeding supplement  237 mL Oral BID BM   ferrous sulfate   325 mg Oral BID WC   finasteride   5 mg Oral Daily   folic acid   1 mg Oral Daily   gabapentin   100 mg Oral TID   insulin  aspart  0-15 Units Subcutaneous TID WC   insulin  aspart  0-5 Units Subcutaneous QHS   mirtazapine   30 mg Oral QHS   multivitamin with minerals  1 tablet Oral Daily   polycarbophil  625 mg  Oral BID   pravastatin   20 mg Oral QHS   primidone   50 mg Oral BID   sodium chloride  flush  3 mL Intravenous Q12H   tamsulosin   0.4 mg Oral Daily   traZODone   100 mg Oral QHS   venlafaxine  XR  150 mg Oral Daily   Continuous Infusions:  sodium chloride      albumin  human Stopped (01/10/24 0507)   iron  sucrose Stopped (01/10/24 1030)     LOS: 2 days    Time spent: 35 minutes.    Leatrice Chapel, MD  [facility] Pager #: [phone] 7PM-7AM contact night coverage as above

## 2024-01-11 NOTE — Progress Notes (Signed)
 01/11/2024  Calvin Escobar [id number] [birth year]  CARE TEAM: PCP: [name], MD  Outpatient Care Team: Patient Care Team: [name], MD as PCP - General (Internal Medicine) [name], MD as PCP - Cardiology (Cardiology) [name], MD (Dermatology) [facility] (Behavioral Health) Ezzard Rolin BIRCH, LCSW as VBCI Care Management (Licensed Clinical Social Worker) [name] (Psychiatry) [name], [name] LABOR, MD (Psychiatry) Legrand Victory LITTIE MOULD, MD as Consulting Physician (Gastroenterology) Sheldon Standing, MD as Consulting Physician (Colon and Rectal Surgery) Ezzard Rolin BIRCH KEN (Licensed Clinical Social Worker) Carolee Sherwood BIRCH MOULD, MD as Consulting Physician (Urology) Daneen Damien BROCKS, NP as Nurse Practitioner (Cardiology)  Inpatient Treatment Team: Treatment Team:  Sheldon Standing, MD Carolee Sherwood BIRCH MOULD, MD Massie Delaine SAUNDERS, RN Sharol Sheffield BRAVO, NT [name], MD [name], MD Alger Chanetta Amos Lela, RN Perri Tinnie LABOR, NT Francella Ferrier, RN Margery Katheryn LABOR, PTA Teresa Amiel BIRCH, NT Jerona Lauraine BROCKS, RN   Problem List:   Principal Problem:   Colovesical fistula Active Problems:   Bipolar affective disorder, depressed (HCC)   Dementia (HCC)   Chronic HFrEF (heart failure with reduced ejection fraction) (HCC)   Chronic bronchitis (HCC)   HLD (hyperlipidemia)   OSA treated with BiPAP   BPH (benign prostatic hyperplasia)   Primary insomnia   Hypertension   Chronic kidney disease, stage 3a (HCC)   Stricture of male urethra   Stricture of sigmoid colon (HCC)   History of COVID-19   History of urinary retention   Chronic indwelling Foley catheter   01/09/2024  POST-OPERATIVE DIAGNOSIS:   COLOVESICAL FISTULA WITH ABSCESS RECTOSIGMOID STRICTURE   PROCEDURE:   -ROBOTIC LOW ANTERIOR RECTOSIGMOID RESECTION (LAR) WITH ANASTOMOSIS -COLOVESICAL FISTULA TAKEDOWN & REPAIR -DIVERTING LOOP  ILEOSTOMY (DLI),  =DRAINAGE OF PELVIC ABSCESS -LYSIS OF ADHESIONS x45 MINUTES (30% OF CASE) -INTRAOPERATIVE ASSESSMENT OF TISSUE VASCULAR PERFUSION USING ICG (indocyanine green ) IMMUNOFLUORESCENCE -TRANSVERSUS ABDOMINIS PLANE (TAP) BLOCK - BILATERAL -FLEXIBLE SIGMOIDOSCOPY   SURGEON:  [name], MD  OR FINDINGS:   Patient had very thickened rectosigmoid colon folded and twisted upon itself with stricturing.  Extremely dense adhesions to the left dome of the bladder with 4 x 3 x 3 cm abscess.  Abscess decompressed.  No active persistent opening of the bladder on insufflation.   No obvious metastatic disease on visceral parietal peritoneum or liver.   It is a 31mm EEA anastomosis ( distal descending colon  connected to proximal rectum.)  It rests 17 cm from the anal verge by flexible sigmoidoscopy    Assessment [facility] Stay = 2 days) 2 Days Post-Op    Guarded but relatively stable   Plan:  -ERAS protocol  Having return of bowel function.  Soft diet.  Can advance to regular diet as tolerated.  Long discussion with patient wife he notes he is a very picky eater.  Will see if nutrition has other options to help.  I suspect he could benefit from supplemental shakes but he tends to avoid them as well.    Follow-up on pathology.  Most likely consistent with diverticulitis with chronic stricture causing the colovesical fistula  Diverting loop ileostomy in place.  Continue on fiber with oral iron .  PRN (as needed) antidiarrheals if becomes high output.  High risk for high output ileostomy but will have to see.  Usually can hold off on any diuretics that he usually is dependent upon on given the fact that he will have  chronic diarrhea with that   Some acute postoperative blood loss anemia in the setting of chronic disease.  Well give IV iron  in the hopes of keeping hemoglobin from dropping and minimizing the chance of transfusion.  -monitor electrolytes & replace as needed. Keep K>4,  Mg>2, Phos>3  History of hypertension on numerous medications.  Most likely will have to back off and hold parameters given his softer blood pressure.  See what medicine thinks.  Keep Foley catheter given chronic urinary retention in the setting of urethral stricture and colovesical fistula per [facility].  Of asked urology to make sure there is an appointment for the patient to follow-up for bladder function reevaluation and most likely in the next 2-4 weeks from discharge.  Defer to Dr. Carolee  -VTE prophylaxis- SCDs.  Anticoagulation prophyllaxis SQ as appropriate  -mobilize as tolerated to help recovery.  Enlist therapies in moderate/high risk patients as appropriate.  Physical therapy and Occupational Therapy ordered.  [facility] internal medicine consultation to help follow given the patient's complexity.  I updated the patient's status to the patient and nurse patient's wife was at bedside so was able to update and give a long discussion about surgical interventions, reasoning, postoperative recommendations and plans.  Recommendations were made.  Questions were answered.  They expressed understanding & appreciation.  -Disposition:  Disposition:  The patient is from: Home Anticipate discharge to:  Skilled Nursing Facility (SNF) Anticipated Date of Discharge is:  August 11,2025   Barriers to discharge:  Pending Clinical improvement (more likely than not), Therapy assessment & Recommendations pending, and Consultant clearance & sign off    Patient currently is NOT MEDICALLY STABLE for discharge from the hospital from a surgery standpoint.      I reviewed nursing notes, hospitalist notes, last 24 h vitals and pain scores, last 48 h intake and output, last 24 h labs and trends, and last 24 h imaging results.  I have reviewed this patient's available data, including medical history, events of note, test results, etc as part of my evaluation.   A significant portion of that time was spent  in counseling. Care during the described time interval was provided by me.  This care required moderate level of medical decision making.  01/11/2024    Subjective: (Chief complaint)  Patient transferred to floor.  No major events.  Resting comfortably.  Had some soreness but better now.  Wife at bedside.  Tolerating liquids.  Does not enjoy hospital food.  Objective:  Vital signs:  Vitals:   01/10/24 2137 01/10/24 2147 01/11/24 0120 01/11/24 0509  BP: 100/65  111/67 104/68  Pulse: 94  84 95  Resp:   16 20  Temp:   99.4 F (37.4 C) 98.8 F (37.1 C)  TempSrc:   Oral Oral  SpO2:  96% 92% 95%  Weight:      Height:        Last BM Date : 01/10/24 (from ileostomy)  Intake/Output   Yesterday:  08/08 0701 - 08/09 0700 In: 690 [P.O.:480; IV Piggyback:210] Out: 2100 [Urine:1775; Drains:25; Stool:300] This shift:  No intake/output data recorded.  Bowel function:  Flatus: YES  BM:  YES - thin succuss  Drain: Serosanguinous-19 [place] drain goes into pelvis   Physical Exam:  General: Pt sleeping but awake/alert in no acute distress Eyes: PERRL, normal EOM.  Sclera clear.  No icterus Neuro: CN II-XII intact w/o focal sensory/motor deficits. Lymph: No head/neck/groin lymphadenopathy Psych:  No delerium/psychosis/paranoia.  Oriented x 4 HENT:  Normocephalic, Mucus membranes moist.  No thrush Neck: Supple, No tracheal deviation.  No obvious thyromegaly Chest: No pain to chest wall compression.  Good respiratory excursion.  No audible wheezing CV:  Pulses intact.  Regular rhythm.  No major extremity edema MS: Normal AROM mjr joints.  No obvious deformity  Abdomen: Soft.  Nondistended.  Nontender.  No evidence of peritonitis.  No incarcerated hernias. Right infraumbilical paramedian diverting loop ileostomy in place.  Pink with some edema.  No flatus nor succus.  Ext:   No deformity.  No mjr edema.  No cyanosis Skin: No petechiae / purpurea.  No major sores.  Warm  and dry    Results:   Cultures: Recent Results (from the past 720 hours)  Urine Culture     Status: Abnormal   Collection Time: 12/27/23  1:20 PM  Result Value Ref Range Status   MICRO NUMBER: [id number]  Final   SPECIMEN QUALITY: Adequate  Final   Sample Source URINE  Final   STATUS: FINAL  Final   ISOLATE 1: Enterococcus faecalis (A)  Final    Comment: 10,000-49,000 CFU/mL of Enterococcus faecalis      Susceptibility   Enterococcus faecalis - URINE CULTURE POSITIVE 1    AMPICILLIN  <=2 Sensitive     VANCOMYCIN  1 Sensitive     NITROFURANTOIN* <=16 Sensitive      * Legend: S = Susceptible  I = Intermediate R = Resistant  NS = Not susceptible SDD = Susceptible Dose Dependent * = Not Tested  NR = Not Reported **NN = See Therapy Comments   MRSA Next Gen by PCR, Nasal     Status: None   Collection Time: 01/09/24  3:37 PM   Specimen: Nasal Mucosa; Nasal Swab  Result Value Ref Range Status   MRSA by PCR Next Gen NOT DETECTED NOT DETECTED Final    Comment: (NOTE) The GeneXpert MRSA Assay (FDA approved for NASAL specimens only), is one component of a comprehensive MRSA colonization surveillance program. It is not intended to diagnose MRSA infection nor to guide or monitor treatment for MRSA infections. Test performance is not FDA approved in patients less than 37 years old. Performed at [facility], [address], [place], [place] [address]     Labs: Results for orders placed or performed during the hospital encounter of 01/09/24 (from the past 48 hours)  Glucose, capillary     Status: Abnormal   Collection Time: 01/09/24  2:43 PM  Result Value Ref Range   Glucose-Capillary 178 (H) 70 - 99 mg/dL    Comment: Glucose reference range applies only to samples taken after fasting for at least 8 hours.  CBC     Status: Abnormal   Collection Time: 01/09/24  3:24 PM  Result Value Ref Range   WBC 13.9 (H) 4.0 - 10.5 K/uL   RBC 3.30 (L) 4.22 - 5.81 MIL/uL    Hemoglobin 9.0 (L) 13.0 - 17.0 g/dL   HCT 69.7 (L) 60.9 - 47.9 %   MCV 91.5 80.0 - 100.0 fL   MCH 27.3 26.0 - 34.0 pg   MCHC 29.8 (L) 30.0 - 36.0 g/dL   RDW 85.3 88.4 - 84.4 %   Platelets 182 150 - 400 K/uL   nRBC 0.0 0.0 - 0.2 %    Comment: Performed at [facility], [address] [place], [place] [address]  Comprehensive metabolic panel     Status: Abnormal   Collection Time: 01/09/24  3:24 PM  Result Value Ref Range   Sodium 135 135 - 145 mmol/L   Potassium 4.1 3.5 - 5.1 mmol/L   Chloride 104 98 - 111 mmol/L   CO2 20 (L) 22 - 32 mmol/L   Glucose, Bld 161 (H) 70 - 99 mg/dL    Comment: Glucose reference range applies only to samples taken after fasting for at least 8 hours.   BUN 15 8 - 23 mg/dL   Creatinine, Ser 8.45 (H) 0.61 - 1.24 mg/dL   Calcium  8.2 (L) 8.9 - 10.3 mg/dL   Total Protein 6.6 6.5 - 8.1 g/dL   Albumin  3.1 (L) 3.5 - 5.0 g/dL   AST 18 15 - 41 U/L   ALT 10 0 - 44 U/L   Alkaline Phosphatase 73 38 - 126 U/L   Total Bilirubin 0.4 0.0 - 1.2 mg/dL   GFR, Estimated 47 (L) >60 mL/min    Comment: (NOTE) Calculated using the CKD-EPI Creatinine Equation (2021)    Anion gap 11 5 - 15    Comment: Performed at [facility], [address] [place], [place] [address]  Magnesium      Status: None   Collection Time: 01/09/24  3:24 PM  Result Value Ref Range   Magnesium  1.8 1.7 - 2.4 mg/dL    Comment: Performed at [facility], [address], [place], [place] [address]  Phosphorus     Status: None   Collection Time: 01/09/24  3:24 PM  Result Value Ref Range   Phosphorus 3.5 2.5 - 4.6 mg/dL    Comment: Performed at [facility], [address], [place], [place] [address]  MRSA Next Gen by PCR, Nasal     Status: None   Collection Time: 01/09/24  3:37 PM   Specimen: Nasal Mucosa; Nasal Swab  Result Value Ref Range   MRSA by PCR Next Gen NOT DETECTED NOT DETECTED    Comment: (NOTE) The GeneXpert  MRSA Assay (FDA approved for NASAL specimens only), is one component of a comprehensive MRSA colonization surveillance program. It is not intended to diagnose MRSA infection nor to guide or monitor treatment for MRSA infections. Test performance is not FDA approved in patients less than 37 years old. Performed at [facility], [address] [place], [place] [address]   Glucose, capillary     Status: Abnormal   Collection Time: 01/09/24  5:46 PM  Result Value Ref Range   Glucose-Capillary 157 (H) 70 - 99 mg/dL    Comment: Glucose reference range applies only to samples taken after fasting for at least 8 hours.  Glucose, capillary     Status: Abnormal   Collection Time: 01/09/24  9:45 PM  Result Value Ref Range   Glucose-Capillary 146 (H) 70 - 99 mg/dL    Comment: Glucose reference range applies only to samples taken after fasting for at least 8 hours.  CBC     Status: Abnormal   Collection Time: 01/10/24 12:46 AM  Result Value Ref Range   WBC 11.0 (H) 4.0 - 10.5 K/uL   RBC 2.88 (L) 4.22 - 5.81 MIL/uL   Hemoglobin 8.0 (L) 13.0 - 17.0 g/dL   HCT 72.9 (L) 60.9 - 47.9 %   MCV 93.8 80.0 - 100.0 fL   MCH 27.8 26.0 - 34.0 pg   MCHC 29.6 (L) 30.0 - 36.0 g/dL   RDW 85.1 88.4 - 84.4 %   Platelets 157 150 - 400 K/uL   nRBC 0.0 0.0 -  0.2 %    Comment: Performed at [facility], [address], [place], [place] [address]  Basic metabolic panel     Status: Abnormal   Collection Time: 01/10/24  4:34 AM  Result Value Ref Range   Sodium 134 (L) 135 - 145 mmol/L   Potassium 4.4 3.5 - 5.1 mmol/L   Chloride 104 98 - 111 mmol/L   CO2 22 22 - 32 mmol/L   Glucose, Bld 126 (H) 70 - 99 mg/dL    Comment: Glucose reference range applies only to samples taken after fasting for at least 8 hours.   BUN 16 8 - 23 mg/dL   Creatinine, Ser 8.33 (H) 0.61 - 1.24 mg/dL   Calcium  7.4 (L) 8.9 - 10.3 mg/dL   GFR, Estimated 43 (L) >60 mL/min    Comment: (NOTE) Calculated  using the CKD-EPI Creatinine Equation (2021)    Anion gap 8 5 - 15    Comment: Performed at [facility], [address] [place], [place] [address]  CBC     Status: Abnormal   Collection Time: 01/10/24  4:34 AM  Result Value Ref Range   WBC 9.1 4.0 - 10.5 K/uL   RBC 2.70 (L) 4.22 - 5.81 MIL/uL   Hemoglobin 7.5 (L) 13.0 - 17.0 g/dL   HCT 74.5 (L) 60.9 - 47.9 %   MCV 94.1 80.0 - 100.0 fL   MCH 27.8 26.0 - 34.0 pg   MCHC 29.5 (L) 30.0 - 36.0 g/dL   RDW 85.1 88.4 - 84.4 %   Platelets 148 (L) 150 - 400 K/uL   nRBC 0.0 0.0 - 0.2 %    Comment: Performed at [facility], [address] [place], [place] [address]  Magnesium      Status: Abnormal   Collection Time: 01/10/24  4:34 AM  Result Value Ref Range   Magnesium  1.6 (L) 1.7 - 2.4 mg/dL    Comment: Performed at [facility], [address], [place], [place] [address]  Glucose, capillary     Status: Abnormal   Collection Time: 01/10/24  5:38 AM  Result Value Ref Range   Glucose-Capillary 125 (H) 70 - 99 mg/dL    Comment: Glucose reference range applies only to samples taken after fasting for at least 8 hours.  Glucose, capillary     Status: Abnormal   Collection Time: 01/10/24  7:57 AM  Result Value Ref Range   Glucose-Capillary 119 (H) 70 - 99 mg/dL    Comment: Glucose reference range applies only to samples taken after fasting for at least 8 hours.  Glucose, capillary     Status: Abnormal   Collection Time: 01/10/24 11:25 AM  Result Value Ref Range   Glucose-Capillary 151 (H) 70 - 99 mg/dL    Comment: Glucose reference range applies only to samples taken after fasting for at least 8 hours.  Glucose, capillary     Status: Abnormal   Collection Time: 01/10/24  5:02 PM  Result Value Ref Range   Glucose-Capillary 126 (H) 70 - 99 mg/dL    Comment: Glucose reference range applies only to samples taken after fasting for at least 8 hours.  Glucose, capillary     Status: Abnormal    Collection Time: 01/10/24  9:21 PM  Result Value Ref Range   Glucose-Capillary 125 (H) 70 - 99 mg/dL    Comment: Glucose reference range applies only to samples taken after fasting for at least 8 hours.  Imaging / Studies: DG Chest Port 1 View Result Date: 01/09/2024 CLINICAL DATA:  Short of breath EXAM: PORTABLE CHEST 1 VIEW COMPARISON:  12/14/2023 FINDINGS: Single frontal view of the chest demonstrates right internal jugular catheter tip overlying superior vena cava. Stable cardiac silhouette. No airspace disease, effusion, or pneumothorax. No acute bony abnormalities. IMPRESSION: 1. No acute intrathoracic process. 2. Right internal jugular catheter as above. Electronically Signed   By: Ozell Daring M.D.   On: 01/09/2024 18:51    Medications / Allergies: per chart  Antibiotics: Anti-infectives (From admission, onward)    Start     Dose/Rate Route Frequency Ordered Stop   01/09/24 2200  cefoTEtan  (CEFOTAN ) 2 g in sodium chloride  0.9 % 100 mL IVPB        2 g 200 mL/hr over 30 Minutes Intravenous Every 12 hours 01/09/24 1427 01/09/24 2247   01/09/24 0715  cefoTEtan  (CEFOTAN ) 2 g in sodium chloride  0.9 % 100 mL IVPB        2 g 200 mL/hr over 30 Minutes Intravenous On call to O.R. 01/09/24 0700 01/09/24 1429         Note: Portions of this report may have been transcribed using voice recognition software. Every effort was made to ensure accuracy; however, inadvertent computerized transcription errors may be present.   Any transcriptional errors that result from this process are unintentional.    Elspeth KYM Schultze, MD, FACS, MASCRS Esophageal, Gastrointestinal & Colorectal Surgery Robotic and Minimally Invasive Surgery  [facility] [address], [address] [place], [place] [address] [phone] Fax [phone] Main  CONTACT INFORMATION: Weekday (9AM-5PM): Call [facility] main office at [phone] Weeknight (5PM-9AM) or  Weekend/Holiday: Check EPIC Web Links tab & use AMION (password  [email or web address]) for General Surgery [facility] coverage  Please, DO NOT use SecureChat  (it is not reliable communication to reach operating surgeons & will lead to a delay in care).   Epic staff messaging available for outptient concerns needing 1-2 business day response.      01/11/2024  7:42 AM

## 2024-01-11 NOTE — Plan of Care (Signed)
  Problem: Education: Goal: Understanding of discharge needs will improve Outcome: Progressing Goal: Verbalization of understanding of the causes of altered bowel function will improve Outcome: Progressing

## 2024-01-11 NOTE — Progress Notes (Signed)
 Physical Therapy Treatment Patient Details Name: Calvin Escobar MRN: [id number] DOB: 1951-04-18 Today's Date: 01/11/2024   History of Present Illness 73 y.o. male admitted 01/09/24 with colovesical fistula, s/p rectosigmoid resection, LOA, Ileostomy. Pt with recent admission to [facility] on 7/9-7/17/25 with tachycardia, tachypnea and cough. + COVID and ecoli bacteremia, Other recent admission 6/30-12/07/23 for hematuria--went home with foley catheter. PMH:  hyperlipidemia, systolic heart failure, history of colovesical fistula urinary retention anemia of chronic disease, bipolar disorder, dementia.    PT Comments  POD # 2 PT - Cognition Comments: improved alterness/cognition.  Following all directions.  Pt asking if he is okay and what is happening.  Repeat explaination and directions due to mild anxiety. Pt OOB in recliner via NT.   Assisted with amb in hallway.  General transfer comment: 50% VC's on proper hand placement and 75% VC's safety with turn completion prior to sit.  Pt required any where from Min to Mod Assist to rise and safely lower with increased c/o ABD pain during activity.  Pt is highly motivated. General Gait Details: Pt required Min/Mod Assist using walker a limited distance 18 feet with slow, shuffled steps and forward flexed posture.  Max c/o weakness.  Noted HgB trended downward from 8.0 to 7.5.  Recliner following for safety. Returned to room in recliner.   Prior to asmit, Pt was Indep amb living with his Spouse. LPT has rec Pt will need ST Rehab at SNF to address mobility and functional decline prior to safely returning home.    If plan is discharge home, recommend the following: A lot of help with walking and/or transfers;A lot of help with bathing/dressing/bathroom;Assistance with cooking/housework;Assist for transportation;Help with stairs or ramp for entrance   Can travel by private vehicle     No  Equipment Recommendations  None recommended by PT    Recommendations  for Other Services       Precautions / Restrictions Precautions Precautions: Fall Precaution/Restrictions Comments: abdominal incision, RLQ ileostomy, R LQ Bulb Restrictions Weight Bearing Restrictions Per Provider Order: No     Mobility  Bed Mobility               General bed mobility comments: OOB in recliner via NT + 1 assist    Transfers Overall transfer level: Needs assistance Equipment used: Rolling walker (2 wheels) Transfers: Sit to/from Stand Sit to Stand: Min assist, Mod assist           General transfer comment: 50% VC's on proper hand placement and 75% VC's safety with turn completion prior to sit.  Pt required any where from Min to Mod Assist to rise and safely lower with increased c/o ABD pain during activity.  Pt is highly motivated.    Ambulation/Gait Ambulation/Gait assistance: Min assist Gait Distance (Feet): 18 Feet Assistive device: Rolling walker (2 wheels) Gait Pattern/deviations: Decreased step length - right, Decreased step length - left, Decreased stride length, Trunk flexed Gait velocity: decreased     General Gait Details: Pt required Min/Mod Assist using walker a limited distance 18 feet with slow, shuffled steps and forward flexed posture.  Max c/o weakness.  Noted HgB trended downward from 8.0 to 7.5.  Recliner following for safety.   Stairs             Wheelchair Mobility     Tilt Bed    Modified Rankin (Stroke Patients Only)       Balance  Communication Communication Communication: No apparent difficulties  Cognition Arousal: Alert Behavior During Therapy: WFL for tasks assessed/performed   PT - Cognitive impairments: No apparent impairments                       PT - Cognition Comments: improved alterness/cognition.  Following all directions.  Pt asking if he is okay and what is happening.  Repeat explaination and directions due to mild  anxiety. Following commands: Intact      Cueing Cueing Techniques: Verbal cues  Exercises      General Comments        Pertinent Vitals/Pain Pain Assessment Pain Assessment: Faces Faces Pain Scale: Hurts little more Pain Location: comes and goes ABD recent surgery Pain Descriptors / Indicators: Sore, Grimacing, Guarding, Operative site guarding Pain Intervention(s): Monitored during session, Repositioned, Premedicated before session    Home Living                          Prior Function            PT Goals (current goals can now be found in the care plan section) Progress towards PT goals: Progressing toward goals    Frequency    Min 3X/week      PT Plan      Co-evaluation              AM-PAC PT 6 Clicks Mobility   Outcome Measure  Help needed turning from your back to your side while in a flat bed without using bedrails?: A Lot Help needed moving from lying on your back to sitting on the side of a flat bed without using bedrails?: A Lot Help needed moving to and from a bed to a chair (including a wheelchair)?: A Lot Help needed standing up from a chair using your arms (e.g., wheelchair or bedside chair)?: A Lot Help needed to walk in hospital room?: A Lot Help needed climbing 3-5 steps with a railing? : Total 6 Click Score: 11    End of Session Equipment Utilized During Treatment: Gait belt Activity Tolerance: Patient limited by fatigue Patient left: with call bell/phone within reach;with nursing/sitter in room;in chair;with chair alarm set Nurse Communication: Mobility status PT Visit Diagnosis: Unsteadiness on feet (R26.81);Other abnormalities of gait and mobility (R26.89);Muscle weakness (generalized) (M62.81);Difficulty in walking, not elsewhere classified (R26.2);Pain     Time: 8947-8882 PT Time Calculation (min) (ACUTE ONLY): 25 min  Charges:    $Gait Training: 8-22 mins $Therapeutic Activity: 8-22 mins PT General  Charges $$ ACUTE PT VISIT: 1 Visit                     Katheryn Leap  PTA Acute  Rehabilitation Services Office M-F          [phone]

## 2024-01-11 NOTE — Plan of Care (Signed)
  Problem: Activity: Goal: Ability to tolerate increased activity will improve Outcome: Progressing   Problem: Nutritional: Goal: Will attain and maintain optimal nutritional status will improve Outcome: Progressing   Problem: Clinical Measurements: Goal: Postoperative complications will be avoided or minimized Outcome: Progressing   Problem: Clinical Measurements: Goal: Ability to maintain clinical measurements within normal limits will improve Outcome: Progressing   Problem: Activity: Goal: Risk for activity intolerance will decrease Outcome: Progressing

## 2024-01-12 DIAGNOSIS — N321 Vesicointestinal fistula: Secondary | ICD-10-CM | POA: Diagnosis not present

## 2024-01-12 LAB — CBC
HCT: 25.2 % — ABNORMAL LOW (ref 39.0–52.0)
Hemoglobin: 7.5 g/dL — ABNORMAL LOW (ref 13.0–17.0)
MCH: 27.8 pg (ref 26.0–34.0)
MCHC: 29.8 g/dL — ABNORMAL LOW (ref 30.0–36.0)
MCV: 93.3 fL (ref 80.0–100.0)
Platelets: 195 K/uL (ref 150–400)
RBC: 2.7 MIL/uL — ABNORMAL LOW (ref 4.22–5.81)
RDW: 14.8 % (ref 11.5–15.5)
WBC: 8.1 K/uL (ref 4.0–10.5)
nRBC: 0 % (ref 0.0–0.2)

## 2024-01-12 LAB — GLUCOSE, CAPILLARY
Glucose-Capillary: 99 mg/dL (ref 70–99)
Glucose-Capillary: 96 mg/dL (ref 70–99)
Glucose-Capillary: 105 mg/dL — ABNORMAL HIGH (ref 70–99)
Glucose-Capillary: 125 mg/dL — ABNORMAL HIGH (ref 70–99)
Glucose-Capillary: 120 mg/dL — ABNORMAL HIGH (ref 70–99)

## 2024-01-12 LAB — CREATININE, SERUM
Creatinine, Ser: 1.27 mg/dL — ABNORMAL HIGH (ref 0.61–1.24)
GFR, Estimated: 59 mL/min — ABNORMAL LOW (ref >60)

## 2024-01-12 LAB — POTASSIUM: Potassium: 4.2 mmol/L (ref 3.5–5.1)

## 2024-01-12 MED ORDER — LOPERAMIDE HCL 2 MG PO CAPS: 2.0000 mg | ORAL_CAPSULE | Freq: Four times a day (QID) | ORAL | Status: DC | PRN

## 2024-01-12 MED ORDER — LOPERAMIDE HCL 2 MG PO CAPS
2.0000 mg | ORAL_CAPSULE | Freq: Two times a day (BID) | ORAL | Status: DC
  Administered 2024-01-12 – 2024-01-14 (×8): 2 mg via ORAL
  Filled 2024-01-12 (×6): qty 1

## 2024-01-12 MED ORDER — GUAIFENESIN ER 600 MG PO TB12: 600.0000 mg | ORAL_TABLET | Freq: Two times a day (BID) | ORAL | Status: DC

## 2024-01-12 NOTE — Progress Notes (Signed)
 PROGRESS NOTE  Calvin Escobar  DOB: 09/09/1950  PCP: [name], MD FMW:[id number]  DOA: 01/09/2024  LOS: 3 days  Hospital Day: 4  Brief narrative: Calvin Escobar is a 73 y.o. male with PMH significant for obesity, OSA on BiPAP, HTN, HLD, CHF, nonischemic dilated cardiomyopathy, CKD, dementia, anxiety/depression, hyponatremia, C. 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 coli bacteremia. Most recent CT abdomen from 12/02/2023 suggested chronic colovesical fistula  He was seen by urology and general surgery as inpatient as well as outpatient.  Ultimately recommended for colorectal surgery 8/7, patient underwent robot-assisted sigmoid colectomy and creation of loop colostomy by general surgery for sigmoid colon stricture and colovesical fistula. Hospitalist service was consulted for management of medical issues   Subjective: Patient was seen and examined this morning. Propped up in bed.  Not in distress.  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 Sheldon 8/7 Has greenish liquid output in colostomy bag.   Continue postop management per primary team. Analgesics and antiemetics as needed. Noted that patient is also on Alvimopan  12 mg twice daily, loperamide  twice daily  Hypomagnesemia Replacement was given.  Continue to monitor electrolytes Recent Labs  Lab 01/09/24 1524 01/10/24 0434 01/11/24 1310 01/12/24 1038  K 4.1 4.4 4.3 4.2  MG 1.8 1.6*  --    --   PHOS 3.5  --   --   --    CKD 3a Creatinine at baseline Recent Labs    12/13/23 0657 12/14/23 0600 12/15/23 0821 12/16/23 0615 12/17/23 0601 12/18/23 0507 12/27/23 1320 01/01/24 1423 01/09/24 1524 01/10/24 0434 01/11/24 1310 01/12/24 1038  BUN 52* 66* 56* 43* 44* 41* 21 21 15 16   --   --   CREATININE 2.75* 2.41* 1.99* 1.85* 1.89* 1.94* 1.77* 1.41* 1.54* 1.66* 1.51* 1.27*  CO2 21* 20* 22 27 26 27 27 23  20* 22  --   --    Chronic systolic CHF  nonischemic cardiomyopathy HTN Currently hemodynamically stable.  CHF remains compensated Continue carvedilol  6.25 mg daily  HLD Statin  H/o recurrent UTI  Urethral stricture, BPH S/p chronic indwelling Foley catheter Continue Flomax  and finasteride   Dementia Bipolar disorder Insomnia Continue venlafaxine  150 mg daily, buspirone  10 mg 3 times daily, gabapentin  100 mg 3 times daily, Abilify  2 mg daily, trazodone  100 mg daily, melatonin nightly as needed  Obesity 2 Body mass index is 36.54 kg/m. Patient has been advised to make an attempt to improve diet and exercise patterns to aid in weight loss.  OSA CPAP nightly    Mobility:  PT Orders:  Active  PT Follow up Rec:  Skilled Nursing-Short Term Rehab (<3 Hours/Day)01/11/2024 1131   Goals of care   Code Status: Full Code     DVT prophylaxis:  enoxaparin  (LOVENOX ) injection 40 mg Start: 01/11/24 0800 SCD's Start: 01/09/24 1428   Antimicrobials: Not on antibiotics currently Fluid: None Consultants: General Surgery Family Communication: None at bedside  Status: Inpatient Level of care:  Med-Surg   Patient is from: Home Needs to continue in-hospital care: Postop status Anticipated d/c to: SNF  Diet:  Diet Order             DIET SOFT Room service appropriate? Yes; Fluid consistency: Thin  Diet effective now           Diet - low sodium heart healthy                   Scheduled Meds:  acetaminophen   1,000 mg Oral Q6H   ARIPiprazole   2 mg  Oral q AM   busPIRone   10 mg Oral TID   carvedilol   6.25 mg Oral BID   Chlorhexidine  Gluconate Cloth  6 each Topical Daily   enoxaparin  (LOVENOX ) injection  40 mg Subcutaneous Q24H   feeding supplement  237 mL Oral BID BM   ferrous sulfate   325 mg Oral BID WC   finasteride   5 mg Oral Daily   folic acid   1 mg Oral Daily   gabapentin   100 mg Oral TID   insulin  aspart  0-15 Units Subcutaneous TID WC   insulin  aspart  0-5 Units Subcutaneous QHS   loperamide   2 mg Oral BID   mirtazapine   30 mg Oral QHS   multivitamin with minerals  1 tablet Oral Daily   polycarbophil  625 mg Oral BID   pravastatin   20 mg Oral QHS   primidone   50 mg Oral BID   sodium chloride  flush  3 mL Intravenous Q12H   tamsulosin   0.4 mg Oral Daily   venlafaxine  XR  150 mg Oral Daily    PRN meds: sodium chloride , albumin  human, diphenhydrAMINE  **OR** diphenhydrAMINE , hydrALAZINE , HYDROmorphone  (DILAUDID ) injection, loperamide , magic mouthwash, melatonin, menthol -cetylpyridinium, metoprolol  tartrate, ondansetron  **OR** ondansetron  (ZOFRAN ) IV, mouth rinse, phenol, simethicone , sodium chloride  flush, traMADol    Infusions:   sodium chloride      albumin  human Stopped (01/10/24 0507)   iron  sucrose Stopped (01/10/24 1030)    Antimicrobials: Anti-infectives (From admission, onward)    Start     Dose/Rate Route Frequency Ordered Stop   01/09/24 2200  cefoTEtan  (CEFOTAN ) 2 g in sodium chloride  0.9 % 100 mL IVPB        2 g 200 mL/hr over 30 Minutes Intravenous Every 12 hours 01/09/24 1427 01/09/24 2247   01/09/24 0715  cefoTEtan  (CEFOTAN ) 2 g in sodium chloride  0.9 % 100 mL IVPB        2 g 200 mL/hr over 30 Minutes Intravenous On call to O.R. 01/09/24 0700 01/09/24 1429       Objective: Vitals:   01/12/24 0639 01/12/24 0938  BP: 117/73 133/78  Pulse: 73 91  Resp: 16 16  Temp: 98 F (36.7 C) 97.8 F (36.6 C)  SpO2: 94% 95%    Intake/Output Summary (Last 24 hours) at 01/12/2024 1153 Last data filed at  01/12/2024 0900 Gross per 24 hour  Intake 760 ml  Output 5130 ml  Net -4370 ml   Filed Weights   01/09/24 1428 01/10/24 0529 01/12/24 0500  Weight: 101.9 kg 103.3 kg 102.7 kg   Weight change:  Body mass index is 36.54 kg/m.   Physical Exam: General exam: Pleasant, elderly Caucasian male.  Not in distress Skin: No rashes, lesions or ulcers. HEENT: Atraumatic, normocephalic, no obvious bleeding Lungs: Clear to auscultation bilaterally,  CVS: S1, S2, no murmur,   GI/Abd: Soft, nontender, nondistended, bowel sound present, colostomy bag on right lower quadrant with greenish liquid output CNS: Alert, awake, oriented x 3 Psychiatry: Sad affect Extremities: No pedal edema, no calf tenderness,   Data Review: I  have personally reviewed the laboratory data and studies available.  F/u labs  Unresulted Labs (From admission, onward)     Start     Ordered   01/16/24 0500  Creatinine, serum  (enoxaparin  (LOVENOX )  CrCl >/= 30 mL/min  )  Weekly,   R     Comments: while on enoxaparin  therapy.    01/09/24 1427   01/11/24 1151  CBC  Daily,   R     Question:  Specimen collection method  Answer:  Unit=Unit collect   01/11/24 1150   01/11/24 1151  Creatinine, serum  Daily,   R     Question:  Specimen collection method  Answer:  Unit=Unit collect   01/11/24 1150   01/11/24 1151  Potassium  Daily,   R     Question:  Specimen collection method  Answer:  Unit=Unit collect   01/11/24 1150            Signed, Chapman Rota, MD [facility] 01/12/2024

## 2024-01-12 NOTE — Plan of Care (Signed)
  Problem: Activity: Goal: Ability to tolerate increased activity will improve Outcome: Progressing   Problem: Nutritional: Goal: Will attain and maintain optimal nutritional status will improve Outcome: Progressing

## 2024-01-12 NOTE — Progress Notes (Signed)
 01/12/2024  Calvin Escobar [id number] April 15, 1951  CARE TEAM: PCP: [name], MD  Outpatient Care Team: Patient Care Team: [name], MD as PCP - General (Internal Medicine) [name], MD as PCP - Cardiology (Cardiology) [name], MD (Dermatology) [facility] (Behavioral Health) Ezzard Rolin BIRCH, LCSW as VBCI Care Management (Licensed Clinical Social Worker) [name] (Psychiatry) [name], [name] LABOR, MD (Psychiatry) Legrand Victory LITTIE MOULD, MD as Consulting Physician (Gastroenterology) Sheldon Standing, MD as Consulting Physician (Colon and Rectal Surgery) Ezzard Rolin BIRCH KEN (Licensed Clinical Social Worker) Carolee Sherwood BIRCH MOULD, MD as Consulting Physician (Urology) Daneen Damien BROCKS, NP as Nurse Practitioner (Cardiology)  Inpatient Treatment Team: Treatment Team:  Sheldon Standing, MD Carolee Sherwood BIRCH MOULD, MD Massie Delaine SAUNDERS, RN [name], MD [address], Amos File, RN Francella Ferrier, RN Leron Riis, [place] Dahal, Chapman, MD Mahat, Sarala, RN Jerona Lauraine BROCKS, RN   Problem List:   Principal Problem:   Colovesical fistula Active Problems:   Bipolar affective disorder, depressed (HCC)   Dementia (HCC)   Chronic HFrEF (heart failure with reduced ejection fraction) (HCC)   Chronic bronchitis (HCC)   HLD (hyperlipidemia)   OSA treated with BiPAP   BPH (benign prostatic hyperplasia)   Primary insomnia   Hypertension   Chronic kidney disease, stage 3a (HCC)   Stricture of male urethra   Stricture of sigmoid colon (HCC)   History of COVID-19   History of urinary retention   Chronic indwelling Foley catheter   Ileostomy in place (HCC)   01/09/2024  POST-OPERATIVE DIAGNOSIS:   COLOVESICAL FISTULA WITH ABSCESS RECTOSIGMOID STRICTURE   PROCEDURE:   -ROBOTIC LOW ANTERIOR RECTOSIGMOID RESECTION (LAR) WITH ANASTOMOSIS -COLOVESICAL FISTULA TAKEDOWN & REPAIR -DIVERTING LOOP ILEOSTOMY (DLI),  =DRAINAGE OF  PELVIC ABSCESS -LYSIS OF ADHESIONS x45 MINUTES (30% OF CASE) -INTRAOPERATIVE ASSESSMENT OF TISSUE VASCULAR PERFUSION USING ICG (indocyanine green ) IMMUNOFLUORESCENCE -TRANSVERSUS ABDOMINIS PLANE (TAP) BLOCK - BILATERAL -FLEXIBLE SIGMOIDOSCOPY   SURGEON:  [name], MD  OR FINDINGS:   Patient had very thickened rectosigmoid colon folded and twisted upon itself with stricturing.  Extremely dense adhesions to the left dome of the bladder with 4 x 3 x 3 cm abscess.  Abscess decompressed.  No active persistent opening of the bladder on insufflation.   No obvious metastatic disease on visceral parietal peritoneum or liver.   It is a 31mm EEA anastomosis ( distal descending colon  connected to proximal rectum.)  It rests 17 cm from the anal verge by flexible sigmoidoscopy    Assessment [facility] Stay = 3 days) 3 Days Post-Op    Improved    Plan:  -ERAS protocol  -Remove dressings  -Solid diet as tolerated.  Apparently he is a very picky eater.  Will see if nutrition has other options to help.  I suspect he could benefit from supplemental shakes but he tends to avoid them as well.    Follow-up on pathology.  Most likely consistent with diverticulitis with chronic stricture causing the colovesical fistula  Diverting loop ileostomy in place.  Having significant output.  Due to usual ileostomy regimen of fiber/iron /Imodium  twice daily.  Increase Imodium  as needed to have the ileostomy output less than a liter a day.  I discontinued his furosemide  diuretics to avoid him getting too dehydrated.  Follow creatinine since he already has some chronic kidney disease at baseline to make sure he does not get too dehydrated.  Acute postoperative blood loss anemia in the  setting of chronic disease.  IV iron  8/9.  Add oral iron  twice daily as well try to avoid perioperative transfusion.  Follow hemoglobin  -monitor electrolytes & replace as needed. Keep K>4, Mg>2, Phos>3  History of  hypertension on numerous medications.  Most likely will have to back off and hold parameters given his softer blood pressure.  See what medicine thinks.  Keep Foley catheter given chronic urinary retention in the setting of urethral stricture and colovesical fistula per [facility].  Of asked urology to make sure there is an appointment for the patient to follow-up for bladder function reevaluation and most likely in the next 2-4 weeks from discharge.  Defer to Dr. Carolee  -VTE prophylaxis- SCDs.  Anticoagulation prophyllaxis SQ as appropriate.  May need to hold if hemoglobin gets below 7  -mobilize as tolerated to help recovery.  Enlist therapies in moderate/high risk patients as appropriate.  Physical therapy and Occupational Therapy ordered.  [facility] internal medicine consultation to help follow given the patient's complexity.  I updated the patient's status to the patient and nurse patient's wife was at bedside so was able to update and give a long discussion about surgical interventions, reasoning, postoperative recommendations and plans.  Recommendations were made.  Questions were answered.  They expressed understanding & appreciation.  -Disposition:  Disposition:  The patient is from: Home Anticipate discharge to:  Skilled Nursing Facility (SNF) Anticipated Date of Discharge is:  August 11,2025   Barriers to discharge:  Pending Clinical improvement (more likely than not), Therapy assessment & Recommendations pending, and Consultant clearance & sign off    Patient currently is NOT MEDICALLY STABLE for discharge from the hospital from a surgery standpoint.      I reviewed nursing notes, hospitalist notes, last 24 h vitals and pain scores, last 48 h intake and output, last 24 h labs and trends, and last 24 h imaging results.  I have reviewed this patient's available data, including medical history, events of note, test results, etc as part of my evaluation.   A significant portion of  that time was spent in counseling. Care during the described time interval was provided by me.  This care required moderate level of medical decision making.  01/12/2024    Subjective: (Chief complaint)  Patient more alert today and feeling better.  Nurse in room.  Denies any nausea or vomiting.  Objective:  Vital signs:  Vitals:   01/11/24 2253 01/12/24 0108 01/12/24 0500 01/12/24 0639  BP: 108/69 112/69  117/73  Pulse: 94 82  73  Resp: 20 16  16   Temp: 98.6 F (37 C) 98.4 F (36.9 C)  98 F (36.7 C)  TempSrc: Oral Oral  Oral  SpO2: 93% 92%  94%  Weight:   102.7 kg   Height:        Last BM Date :  (ostomy)  Intake/Output   Yesterday:  08/09 0701 - 08/10 0700 In: 763 [P.O.:760; I.V.:3] Out: 5205 [Urine:3700; Drains:155; Stool:1350] This shift:  No intake/output data recorded.  Bowel function:  Flatus: YES  BM:  YES - thin succuss  Drain: Serosanguinous-19 [place] drain goes into pelvis   Physical Exam:  General: Pt sleeping but awake/alert in no acute distress Eyes: PERRL, normal EOM.  Sclera clear.  No icterus Neuro: CN II-XII intact w/o focal sensory/motor deficits. Lymph: No head/neck/groin lymphadenopathy Psych:  No delerium/psychosis/paranoia.  Oriented x 4 HENT: Normocephalic, Mucus membranes moist.  No thrush Neck: Supple, No tracheal deviation.  No obvious  thyromegaly Chest: No pain to chest wall compression.  Good respiratory excursion.  No audible wheezing CV:  Pulses intact.  Regular rhythm.  No major extremity edema MS: Normal AROM mjr joints.  No obvious deformity  Abdomen: Soft.  Nondistended.  Nontender.  No evidence of peritonitis.  No incarcerated hernias. Right infraumbilical paramedian diverting loop ileostomy in place.  Pink with some edema.  No flatus nor succus.  Ext:   No deformity.  No mjr edema.  No cyanosis Skin: No petechiae / purpurea.  No major sores.  Warm and dry    Results:   Cultures: Recent Results (from  the past 720 hours)  Urine Culture     Status: Abnormal   Collection Time: 12/27/23  1:20 PM  Result Value Ref Range Status   MICRO NUMBER: [id number]  Final   SPECIMEN QUALITY: Adequate  Final   Sample Source URINE  Final   STATUS: FINAL  Final   ISOLATE 1: Enterococcus faecalis (A)  Final    Comment: 10,000-49,000 CFU/mL of Enterococcus faecalis      Susceptibility   Enterococcus faecalis - URINE CULTURE POSITIVE 1    AMPICILLIN  <=2 Sensitive     VANCOMYCIN  1 Sensitive     NITROFURANTOIN* <=16 Sensitive      * Legend: S = Susceptible  I = Intermediate R = Resistant  NS = Not susceptible SDD = Susceptible Dose Dependent * = Not Tested  NR = Not Reported **NN = See Therapy Comments   MRSA Next Gen by PCR, Nasal     Status: None   Collection Time: 01/09/24  3:37 PM   Specimen: Nasal Mucosa; Nasal Swab  Result Value Ref Range Status   MRSA by PCR Next Gen NOT DETECTED NOT DETECTED Final    Comment: (NOTE) The GeneXpert MRSA Assay (FDA approved for NASAL specimens only), is one component of a comprehensive MRSA colonization surveillance program. It is not intended to diagnose MRSA infection nor to guide or monitor treatment for MRSA infections. Test performance is not FDA approved in patients less than 101 years old. Performed at [facility], [address] [place], [place] [address]     Labs: Results for orders placed or performed during the hospital encounter of 01/09/24 (from the past 48 hours)  Glucose, capillary     Status: Abnormal   Collection Time: 01/10/24 11:25 AM  Result Value Ref Range   Glucose-Capillary 151 (H) 70 - 99 mg/dL    Comment: Glucose reference range applies only to samples taken after fasting for at least 8 hours.  Glucose, capillary     Status: Abnormal   Collection Time: 01/10/24  5:02 PM  Result Value Ref Range   Glucose-Capillary 126 (H) 70 - 99 mg/dL    Comment: Glucose reference range applies only to samples taken after  fasting for at least 8 hours.  Glucose, capillary     Status: Abnormal   Collection Time: 01/10/24  9:21 PM  Result Value Ref Range   Glucose-Capillary 125 (H) 70 - 99 mg/dL    Comment: Glucose reference range applies only to samples taken after fasting for at least 8 hours.  Glucose, capillary     Status: Abnormal   Collection Time: 01/11/24  8:28 AM  Result Value Ref Range   Glucose-Capillary 104 (H) 70 - 99 mg/dL    Comment: Glucose reference range applies only to samples taken after fasting for at least 8 hours.  Glucose, capillary  Status: Abnormal   Collection Time: 01/11/24 11:58 AM  Result Value Ref Range   Glucose-Capillary 142 (H) 70 - 99 mg/dL    Comment: Glucose reference range applies only to samples taken after fasting for at least 8 hours.  CBC     Status: Abnormal   Collection Time: 01/11/24  1:10 PM  Result Value Ref Range   WBC 9.5 4.0 - 10.5 K/uL   RBC 2.69 (L) 4.22 - 5.81 MIL/uL   Hemoglobin 7.3 (L) 13.0 - 17.0 g/dL   HCT 74.3 (L) 60.9 - 47.9 %   MCV 95.2 80.0 - 100.0 fL   MCH 27.1 26.0 - 34.0 pg   MCHC 28.5 (L) 30.0 - 36.0 g/dL   RDW 85.1 88.4 - 84.4 %   Platelets 167 150 - 400 K/uL   nRBC 0.0 0.0 - 0.2 %    Comment: Performed at [facility], [address] [place], [place] [address]  Creatinine, serum     Status: Abnormal   Collection Time: 01/11/24  1:10 PM  Result Value Ref Range   Creatinine, Ser 1.51 (H) 0.61 - 1.24 mg/dL   GFR, Estimated 48 (L) >60 mL/min    Comment: (NOTE) Calculated using the CKD-EPI Creatinine Equation (2021) Performed at [facility], [address] [place], [place] [address]   Potassium     Status: None   Collection Time: 01/11/24  1:10 PM  Result Value Ref Range   Potassium 4.3 3.5 - 5.1 mmol/L    Comment: Performed at [facility], [address] [place], [place] [address]  Glucose, capillary     Status: Abnormal   Collection Time: 01/11/24  5:07 PM  Result  Value Ref Range   Glucose-Capillary 143 (H) 70 - 99 mg/dL    Comment: Glucose reference range applies only to samples taken after fasting for at least 8 hours.  Glucose, capillary     Status: Abnormal   Collection Time: 01/11/24  9:53 PM  Result Value Ref Range   Glucose-Capillary 121 (H) 70 - 99 mg/dL    Comment: Glucose reference range applies only to samples taken after fasting for at least 8 hours.  Glucose, capillary     Status: None   Collection Time: 01/12/24  1:02 AM  Result Value Ref Range   Glucose-Capillary 99 70 - 99 mg/dL    Comment: Glucose reference range applies only to samples taken after fasting for at least 8 hours.  Glucose, capillary     Status: None   Collection Time: 01/12/24  7:52 AM  Result Value Ref Range   Glucose-Capillary 96 70 - 99 mg/dL    Comment: Glucose reference range applies only to samples taken after fasting for at least 8 hours.    Imaging / Studies: No results found.   Medications / Allergies: per chart  Antibiotics: Anti-infectives (From admission, onward)    Start     Dose/Rate Route Frequency Ordered Stop   01/09/24 2200  cefoTEtan  (CEFOTAN ) 2 g in sodium chloride  0.9 % 100 mL IVPB        2 g 200 mL/hr over 30 Minutes Intravenous Every 12 hours 01/09/24 1427 01/09/24 2247   01/09/24 0715  cefoTEtan  (CEFOTAN ) 2 g in sodium chloride  0.9 % 100 mL IVPB        2 g 200 mL/hr over 30 Minutes Intravenous On call to O.R. 01/09/24 0700 01/09/24 1429         Note: Portions of  this report may have been transcribed using voice recognition software. Every effort was made to ensure accuracy; however, inadvertent computerized transcription errors may be present.   Any transcriptional errors that result from this process are unintentional.    Elspeth KYM Schultze, MD, FACS, MASCRS Esophageal, Gastrointestinal & Colorectal Surgery Robotic and Minimally Invasive Surgery  [facility] [address] [place], [place] [address] [phone] Fax [phone] Main  CONTACT INFORMATION: Weekday (9AM-5PM): Call [facility] main office at [phone] Weeknight (5PM-9AM) or Weekend/Holiday: Check EPIC Web Links tab & use AMION (password  [email or web address]) for General Surgery [facility] coverage  Please, DO NOT use SecureChat  (it is not reliable communication to reach operating surgeons & will lead to a delay in care).   Epic staff messaging available for outptient concerns needing 1-2 business day response.      01/12/2024  7:58 AM

## 2024-01-12 NOTE — Progress Notes (Signed)
 Pharmacy Brief Note - Alvimopan  (Entereg )  The standing order set for alvimopan  (Entereg ) now includes an automatic order to discontinue the drug after the patient has had a bowel movement. The change was approved by the Pharmacy & Therapeutics Committee and the Medical Executive Committee.   This patient has had bowel movements documented by nursing. Therefore, alvimopan  has been discontinued. If there are questions, please contact the pharmacy at [phone].   Thank you-  Iantha Batch, PharmD, BCPS 01/12/2024 9:58 AM

## 2024-01-13 DIAGNOSIS — N321 Vesicointestinal fistula: Secondary | ICD-10-CM | POA: Diagnosis not present

## 2024-01-13 LAB — CBC
HCT: 24.9 % — ABNORMAL LOW (ref 39.0–52.0)
Hemoglobin: 7.2 g/dL — ABNORMAL LOW (ref 13.0–17.0)
MCH: 27.1 pg (ref 26.0–34.0)
MCHC: 28.9 g/dL — ABNORMAL LOW (ref 30.0–36.0)
MCV: 93.6 fL (ref 80.0–100.0)
Platelets: 250 K/uL (ref 150–400)
RBC: 2.66 MIL/uL — ABNORMAL LOW (ref 4.22–5.81)
RDW: 14.9 % (ref 11.5–15.5)
WBC: 8.8 K/uL (ref 4.0–10.5)
nRBC: 0 % (ref 0.0–0.2)

## 2024-01-13 LAB — GLUCOSE, CAPILLARY
Glucose-Capillary: 107 mg/dL — ABNORMAL HIGH (ref 70–99)
Glucose-Capillary: 133 mg/dL — ABNORMAL HIGH (ref 70–99)
Glucose-Capillary: 178 mg/dL — ABNORMAL HIGH (ref 70–99)
Glucose-Capillary: 109 mg/dL — ABNORMAL HIGH (ref 70–99)

## 2024-01-13 LAB — CREATININE, SERUM
Creatinine, Ser: 1.44 mg/dL — ABNORMAL HIGH (ref 0.61–1.24)
GFR, Estimated: 51 mL/min — ABNORMAL LOW (ref >60)

## 2024-01-13 LAB — POTASSIUM: Potassium: 4.1 mmol/L (ref 3.5–5.1)

## 2024-01-13 LAB — SURGICAL PATHOLOGY

## 2024-01-13 MED ORDER — LACTATED RINGERS IV BOLUS
1000.0000 mL | Freq: Once | INTRAVENOUS | Status: AC
  Administered 2024-01-13 (×2): 1000 mL via INTRAVENOUS

## 2024-01-13 MED ORDER — IRON SUCROSE 200 MG IVPB - SIMPLE MED
200.0000 mg | Freq: Once | Status: AC
  Administered 2024-01-13 (×2): 200 mg via INTRAVENOUS
  Filled 2024-01-13: qty 200

## 2024-01-13 NOTE — Progress Notes (Signed)
 Physical Therapy Treatment Patient Details Name: Calvin Escobar MRN: [id number] DOB: 1951-01-16 Today's Date: 01/13/2024   History of Present Illness 73 y.o. male admitted 01/09/24 with colovesical fistula, s/p rectosigmoid resection, LOA, Ileostomy. Pt with recent admission to [facility] on 7/9-7/17/25 with tachycardia, tachypnea and cough. + COVID and ecoli bacteremia, Other recent admission 6/30-12/07/23 for hematuria--went home with foley catheter. PMH:  hyperlipidemia, systolic heart failure, history of colovesical fistula urinary retention anemia of chronic disease, bipolar disorder, dementia.    PT Comments  Cognition Comments: improved alertness/cognition following all commands.  Pleasant. Pt OOB in recliner via NT.  Assisted with amb was difficult due to recent ABD surgery.  General transfer comment: 25% VC's on proper hand placement and 7550% VC's safety with turn completion prior to sit.  Pt required any where from Min to Mod Assist to rise and safely lower with increased c/o ABD pain during activity.  Pt is highly motivated. General Gait Details: Pt required Min/Mod Assist using walker a limited distance 24 feet x 2 seated rest breaks with slow, shuffled steps and forward flexed posture.  Max c/o fatigue/weakness.  Noted HgB 7.2 as well as low Iron . Recliner following for safety. Prior to admit, Pt was Indep.  LPT has rec Pt will need ST Rehab at SNF to address mobility and functional decline prior to safely returning home.    If plan is discharge home, recommend the following: A lot of help with walking and/or transfers;A lot of help with bathing/dressing/bathroom;Assistance with cooking/housework;Assist for transportation;Help with stairs or ramp for entrance   Can travel by private vehicle     No  Equipment Recommendations  None recommended by PT    Recommendations for Other Services       Precautions / Restrictions Precautions Precautions: Fall Precaution/Restrictions Comments:  abdominal incision, RLQ ileostomy, R LQ Bulb Restrictions Weight Bearing Restrictions Per Provider Order: No     Mobility  Bed Mobility               General bed mobility comments: OOB in recliner via NT + 1 assist    Transfers Overall transfer level: Needs assistance Equipment used: Rolling walker (2 wheels) Transfers: Sit to/from Stand Sit to Stand: Min assist, Mod assist           General transfer comment: 25% VC's on proper hand placement and 7550% VC's safety with turn completion prior to sit.  Pt required any where from Min to Mod Assist to rise and safely lower with increased c/o ABD pain during activity.  Pt is highly motivated.    Ambulation/Gait Ambulation/Gait assistance: Min assist Gait Distance (Feet): 24 Feet Assistive device: Rolling walker (2 wheels) Gait Pattern/deviations: Decreased step length - right, Decreased step length - left, Decreased stride length, Trunk flexed Gait velocity: decreased     General Gait Details: Pt required Min/Mod Assist using walker a limited distance 24 feet x 2 seated rest breaks with slow, shuffled steps and forward flexed posture.  Max c/o fatigue/weakness.  Noted HgB 7.2 as well as low Iron . Recliner following for safety.   Stairs             Wheelchair Mobility     Tilt Bed    Modified Rankin (Stroke Patients Only)       Balance  Communication Communication Communication: No apparent difficulties  Cognition Arousal: Alert Behavior During Therapy: WFL for tasks assessed/performed   PT - Cognitive impairments: No apparent impairments                       PT - Cognition Comments: improved alertness/cognition following all commands.  Pleasant. Following commands: Intact      Cueing Cueing Techniques: Verbal cues  Exercises      General Comments        Pertinent Vitals/Pain Pain Assessment Pain Assessment:  Faces Faces Pain Scale: Hurts little more Pain Location: comes and goes lower ABD recent surgery Pain Descriptors / Indicators: Sore, Grimacing, Guarding, Operative site guarding, Tender Pain Intervention(s): Premedicated before session, Repositioned    Home Living                          Prior Function            PT Goals (current goals can now be found in the care plan section) Progress towards PT goals: Progressing toward goals    Frequency    Min 3X/week      PT Plan      Co-evaluation              AM-PAC PT 6 Clicks Mobility   Outcome Measure  Help needed turning from your back to your side while in a flat bed without using bedrails?: A Lot Help needed moving from lying on your back to sitting on the side of a flat bed without using bedrails?: A Lot Help needed moving to and from a bed to a chair (including a wheelchair)?: A Lot Help needed standing up from a chair using your arms (e.g., wheelchair or bedside chair)?: A Lot Help needed to walk in hospital room?: A Lot Help needed climbing 3-5 steps with a railing? : Total 6 Click Score: 11    End of Session Equipment Utilized During Treatment: Gait belt Activity Tolerance: Patient limited by fatigue Patient left: in chair;with call bell/phone within reach;with chair alarm set Nurse Communication: Mobility status PT Visit Diagnosis: Unsteadiness on feet (R26.81);Other abnormalities of gait and mobility (R26.89);Muscle weakness (generalized) (M62.81);Difficulty in walking, not elsewhere classified (R26.2);Pain     Time: 1012-1031 PT Time Calculation (min) (ACUTE ONLY): 19 min  Charges:    $Gait Training: 8-22 mins PT General Charges $$ ACUTE PT VISIT: 1 Visit                     Katheryn Leap  PTA Acute  Rehabilitation Services Office M-F          [phone]

## 2024-01-13 NOTE — Progress Notes (Signed)
   4 Days Post-Op Subjective: NAEON. Pt was up in chair with no specific urologic complaints. Reviewed case and outpt plan  Objective: Vital signs in last 24 hours: Temp:  [98.2 F (36.8 C)-99.1 F (37.3 C)] 98.2 F (36.8 C) (08/11 0514) Pulse Rate:  [98-100] 98 (08/11 0514) Resp:  [15-16] 16 (08/11 0514) BP: (104-133)/(73-82) 104/82 (08/11 0514) SpO2:  [93 %-100 %] 96 % (08/11 0514)  Assessment/Plan: S/P ROBOTIC LOW ANTERIOR RECTOSIGMOID RESECTION (LAR) WITH ANASTOMOSIS  -COLOVESICAL FISTULA TAKEDOWN & REPAIR  -DIVERTING LOOP ILEOSTOMY (DLI),  =DRAINAGE OF PELVIC ABSCESS  -LYSIS OF ADHESIONS  -INTRAOPERATIVE ASSESSMENT OF TISSUE VASCULAR PERFUSION USING ICG (indocyanine green ) IMMUNOFLUORESCENCE  -TRANSVERSUS ABDOMINIS PLANE (TAP) BLOCK - BILATERAL  -FLEXIBLE SIGMOIDOSCOPY  Foley to stay in for 30d. Will plan for voiding trial in clinic. Urology will sign off at this time. Please call with questions or when he is nearing discharge so that follow up can be arranged  Intake/Output from previous day: 08/10 0701 - 08/11 0700 In: 1260 [P.O.:1260] Out: 3800 [Urine:2600; Drains:100; Stool:1100]  Intake/Output this shift: Total I/O In: 1296.5 [P.O.:240; IV Piggyback:1056.5] Out: 660 [Urine:600; Drains:10; Stool:50]  Physical Exam:  General: Alert and oriented CV: No cyanosis Lungs: equal chest rise Abdomen: Soft, NTND, no rebound or guarding Gu: foley in place draining clear yellow urine.   Lab Results: Recent Labs    01/11/24 1310 01/12/24 1038 01/13/24 0442  HGB 7.3* 7.5* 7.2*  HCT 25.6* 25.2* 24.9*   BMET Recent Labs    01/12/24 1038 01/13/24 0442  K 4.2 4.1  CREATININE 1.27* 1.44*  HGB 7.5* 7.2*  WBC 8.1 8.8     Studies/Results: No results found.    LOS: 4 days   Ole Bourdon, NP [facility] Pager: [phone]  01/13/2024, 10:34 AM

## 2024-01-13 NOTE — Progress Notes (Signed)
 Initial Nutrition Assessment  DOCUMENTATION CODES:   Non-severe (moderate) malnutrition in context of chronic illness  INTERVENTION:   -Ensure Surgery PO BID, each provides 330 kcals and 18g protein   -Magic cup TID with meals, each supplement provides 290 kcal and 9 grams of protein   -Continue vitamin supplementation  NUTRITION DIAGNOSIS:   Moderate Malnutrition related to acute illness as evidenced by mild muscle depletion, moderate fat depletion.  GOAL:   Patient will meet greater than or equal to 90% of their needs  MONITOR:   PO intake, Supplement acceptance  REASON FOR ASSESSMENT:   Consult Assessment of nutrition requirement/status, Wound healing  ASSESSMENT:   73 y.o. male with PMH significant for obesity, OSA on BiPAP, HTN, HLD, CHF, nonischemic dilated cardiomyopathy, CKD, dementia, anxiety/depression, hyponatremia, C. 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 coli bacteremia.  Most recent CT abdomen from 12/02/2023 suggested chronic colovesical fistula  8/7: admitted, s/p rectosigmoid resection, colovesical fistula takedown and repair, diverting loop ileostomy, drainage of pelvic abscess 8/9: full liquids 8/9: soft diet  Patient in room, eating his lunch of meatloaf, broccoli and mac & cheese. Pt consumed 75% of his breakfast this morning. States his appetite is good today. Pt reports no issues with swallowing or chewing. States he is trying to eat the best he can. Was drinking Ensures but stopped as he wants to focus on meals. Encouraged pt to at least sip on them for additional kcals and protein to aid in post-op healing. Pt willing to try Magic cups with meals as well. Reports he was eating okay prior to surgery on 8/7.    Per weight records, pt has lost 21 lbs since 6/10 (8% wt loss x 2  months, significant for time frame).  Medications: Ferrous sulfate , Folic acid , imodium , Remeron , Multivitamin with minerals daily, Fibercon  Labs reviewed: CBGs: 107-133  NUTRITION - FOCUSED PHYSICAL EXAM:  Flowsheet Row Most Recent Value  Orbital Region Mild depletion  Upper Arm Region Moderate depletion  Thoracic and Lumbar Region Unable to assess  Buccal Region Severe depletion  Temple Region Moderate depletion  Clavicle Bone Region Mild depletion  Clavicle and Acromion Bone Region Mild depletion  Scapular Bone Region Mild depletion  Dorsal Hand Mild depletion  Patellar Region Unable to assess  Anterior Thigh Region Unable to assess  Posterior Calf Region Unable to assess  Edema (RD Assessment) Mild  Hair Reviewed  Eyes Reviewed  Mouth Reviewed  [missing upper teeth]  Skin Reviewed  Nails Reviewed    Diet Order:   Diet Order             DIET SOFT Room service appropriate? Yes; Fluid consistency: Thin  Diet effective now           Diet - low sodium heart healthy                   EDUCATION NEEDS:   Education needs have been addressed  Skin:  Skin Assessment: Skin Integrity Issues: Skin Integrity Issues:: Incisions Incisions: 8/7 abdomen  Last BM:  8/11 -type 6&7 -ileostomy  Height:   Ht Readings from Last 1 Encounters:  01/09/24 5' 6 (1.676 m)    Weight:   Wt Readings from Last 1 Encounters:  01/12/24 102.7 kg    BMI:  Body mass index is 36.54 kg/m.  Estimated Nutritional Needs:   Kcal:  1900-2100  Protein:  85-100g  Fluid:  2L/day  Morna Lee, MS, RD, LDN Inpatient Clinical Dietitian Contact via Secure chat

## 2024-01-13 NOTE — TOC Initial Note (Addendum)
 Transition of Care [facility]) - Initial/Assessment Note    Patient Details  Name: Calvin Escobar MRN: [id number] Date of Birth: 11-Aug-1950  [facility]) CM/SW Contact:    Alfonse JONELLE Rex, RN Phone Number: 01/13/2024, 9:44 AM  Clinical Narrative:  PT eval completed, recommendation for short term rehab/SNF. Call to pt's spouse, no answer, vm left with NCM name and phone number requesting c all back . FL2 updated. Level 2 PASRR [id number].      -3:04pm Call to pt's spouse, no answer, vm left with NCM name and phone number requesting a call back.   -3:58pm Call received from patient's spouse, [name], introduced self and role of TOC/NCM, reviewed PT eval for short term rehab/SNF, spouse agreeable, no preference. States she and patient currently reside in a private residence with their son, no current home care services, reports patient has a RW.  Faxed out for bed offers.                    Patient Goals and CMS Choice            Expected Discharge Plan and Services                                              Prior Living Arrangements/Services                       Activities of Daily Living   ADL Screening (condition at time of admission) Independently performs ADLs?: No Does the patient have a NEW difficulty with bathing/dressing/toileting/self-feeding that is expected to last >3 days?: Yes (Initiates electronic notice to provider for possible OT consult) Does the patient have a NEW difficulty with getting in/out of bed, walking, or climbing stairs that is expected to last >3 days?: Yes (Initiates electronic notice to provider for possible PT consult) Does the patient have a NEW difficulty with communication that is expected to last >3 days?: No Is the patient deaf or have difficulty hearing?: No Does the patient have difficulty seeing, even when wearing glasses/contacts?: No Does the patient have difficulty concentrating, remembering, or  making decisions?: Yes  Permission Sought/Granted                  Emotional Assessment              Admission diagnosis:  Intestinovesical fistula [N32.1] 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] Patient Active Problem List   Diagnosis Date Noted   Ileostomy in place [facility]) 01/11/2024   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   History of COVID-19 01/09/2024   History of urinary retention 01/09/2024   Chronic indwelling Foley catheter 01/09/2024   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   Chronic HFrEF (heart failure with reduced ejection fraction) (HCC) 12/13/2023   Hematuria 12/02/2023   Medication management 11/05/2023   Hydronephrosis of left kidney 09/28/2023   Prostatic mass 09/28/2023   Weight loss, abnormal 08/14/2023   Hypocalcemia 08/14/2023   Hypoalbuminemia 08/14/2023   GERD (gastroesophageal reflux disease) 07/13/2023   Recurrent UTI 07/01/2023   LBBB (left bundle branch block) 06/02/2023   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   Obesity (BMI 30-39.9) 05/13/2023   Tremor 05/08/2023   Lack of appetite 05/08/2023   Chronic kidney disease, stage 3a (HCC) 03/07/2023   Anemia of chronic disease 03/07/2023   Limp 12/19/2022   Gynecomastia, male  12/19/2022   Chronic back pain 12/19/2022   Hypertension 11/21/2022   Generalized arthritis 07/18/2022   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   DCM (dilated cardiomyopathy) (HCC)    OSA treated with BiPAP    Primary insomnia 08/05/2017   Dementia (HCC)    HLD (hyperlipidemia)    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 [place] [address] Phone: [phone] Fax: [phone]  [facility] [address] [place] [address] Phone: [phone] Fax: [phone]     Social Drivers of Health (SDOH) Social History: SDOH Screenings   Food Insecurity: No Food Insecurity (01/09/2024)  Housing: Low Risk  (01/09/2024)  Transportation Needs: No Transportation Needs (01/09/2024)  Utilities: Not At Risk (01/09/2024)  Alcohol Screen: Low Risk  (10/10/2023)  Depression (PHQ2-9): Low Risk  (12/09/2023)  Recent Concern: Depression (PHQ2-9) - Medium Risk (09/16/2023)  Financial Resource Strain: Low Risk  (10/10/2023)  Physical Activity: Sufficiently Active (10/10/2023)  Social Connections: Moderately Integrated (01/09/2024)  Recent Concern: Social Connections - Socially Isolated (12/11/2023)  Stress: No Stress Concern Present (10/10/2023)  Tobacco Use: Low Risk  (01/09/2024)  Health Literacy: Adequate Health Literacy (10/10/2023)   SDOH Interventions:     Readmission Risk Interventions    07/04/2023    1:27 PM 07/03/2023    2:48 PM 06/05/2023   10:12 AM  Readmission Risk Prevention Plan  Transportation Screening Complete Complete Complete  PCP or Specialist Appt within 5-7 Days Complete Complete   PCP or Specialist Appt within 3-5 Days   Complete  Home Care Screening Complete Complete   Medication Review (RN CM) Complete Complete   HRI or Home Care Consult   Complete  Social Work Consult for Recovery Care Planning/Counseling   Complete  Palliative Care Screening   Complete  Medication Review [employer])   Complete

## 2024-01-13 NOTE — Progress Notes (Signed)
 PROGRESS NOTE  Calvin Escobar  DOB: 1950/07/10  PCP: [name], MD FMW:[id number]  DOA: 01/09/2024  LOS: 4 days  Hospital Day: 5  Brief narrative: Calvin Escobar is a 73 y.o. male with PMH significant for obesity, OSA on BiPAP, HTN, HLD, CHF, nonischemic dilated cardiomyopathy, CKD, dementia, anxiety/depression, hyponatremia, C. 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 coli bacteremia. Most recent CT abdomen from 12/02/2023 suggested chronic colovesical fistula  He was seen by urology and general surgery as inpatient as well as outpatient.  Ultimately recommended for colorectal surgery 8/7, patient underwent robot-assisted sigmoid colectomy and creation of loop colostomy by general surgery for sigmoid colon stricture and colovesical fistula. Hospitalist service was consulted for management of medical issues   Subjective: Patient was seen and examined this morning. Comfortable in bed.  Not in distress.  Taking his lunch. Family not at bedside. Colostomy bag on the right lower quadrant with liquid greenish output  Assessment and plan: Stricture of sigmoid colon Chronic colovesical fistula S/p robotic sigmoidectomy and creation of loop colostomy-Dr. Sheldon 8/7 Has greenish liquid output in colostomy bag.   Continue postop management per primary team. Analgesics and antiemetics as needed. Noted that patient is also on Alvimopan  12 mg twice daily, loperamide  twice daily  Hypomagnesemia Replacement was given.  Continue to monitor electrolytes Recent Labs  Lab 01/09/24 1524 01/10/24 0434 01/11/24 1310 01/12/24 1038 01/13/24 0442  K 4.1 4.4 4.3 4.2 4.1  MG 1.8 1.6*  --   --   --   PHOS 3.5  --   --   --   --    CKD 3a Creatinine at baseline Recent Labs    12/13/23 0657 12/14/23 0600  12/15/23 0821 12/16/23 0615 12/17/23 0601 12/18/23 0507 12/27/23 1320 01/01/24 1423 01/09/24 1524 01/10/24 0434 01/11/24 1310 01/12/24 1038 01/13/24 0442  BUN 52* 66* 56* 43* 44* 41* 21 21 15 16   --   --   --   CREATININE 2.75* 2.41* 1.99* 1.85* 1.89* 1.94* 1.77* 1.41* 1.54* 1.66* 1.51* 1.27* 1.44*  CO2 21* 20* 22 27 26 27 27 23  20* 22  --   --   --    Chronic systolic CHF  nonischemic cardiomyopathy HTN Currently hemodynamically stable.  CHF remains compensated Continue carvedilol  6.25 mg daily.  Lasix  on hold.  Will  HLD Statin  H/o recurrent UTI  Urethral stricture, BPH S/p chronic indwelling Foley catheter Continue Flomax  and finasteride   Dementia Bipolar disorder Insomnia Continue venlafaxine  150 mg daily, buspirone  10 mg 3 times daily, gabapentin  100 mg 3 times daily, Abilify  2 mg daily, trazodone  100 mg daily, melatonin nightly as needed  Obesity 2 Body mass index is 36.54 kg/m. Patient has been advised to make an attempt to improve diet and exercise patterns to aid in weight loss.  OSA CPAP nightly    Mobility:  PT Orders:  Active  PT Follow up Rec:  Skilled Nursing-Short Term Rehab (<3 Hours/Day)01/11/2024 1131   Goals of care   Code Status: Full Code     DVT prophylaxis:  enoxaparin  (LOVENOX ) injection 40 mg Start: 01/11/24 0800 SCD's Start: 01/09/24 1428   Antimicrobials: Not on antibiotics currently Fluid: None Consultants: General Surgery Family Communication: None at bedside  Status: Inpatient Level of care:  Med-Surg   Patient is from: Home Needs to continue in-hospital care: Postop status.  Medically stable.  Anticipated d/c to: SNF pending    Diet:  Diet Order             DIET SOFT Room service appropriate? Yes; Fluid consistency: Thin  Diet effective now           Diet - low sodium heart healthy                   Scheduled Meds:  acetaminophen   1,000 mg Oral Q6H   ARIPiprazole   2 mg Oral q AM   busPIRone   10 mg  Oral TID   carvedilol   6.25 mg Oral BID   Chlorhexidine  Gluconate Cloth  6 each Topical Daily   enoxaparin  (LOVENOX ) injection  40 mg Subcutaneous Q24H   feeding supplement  237 mL Oral BID BM   ferrous sulfate   325 mg Oral BID WC   finasteride   5 mg Oral Daily   folic acid   1 mg Oral Daily   gabapentin   100 mg Oral TID   insulin  aspart  0-15 Units Subcutaneous TID WC   insulin  aspart  0-5 Units Subcutaneous QHS   loperamide   2 mg Oral BID   mirtazapine   30 mg Oral QHS   multivitamin with minerals  1 tablet Oral Daily   polycarbophil  625 mg Oral BID   pravastatin   20 mg Oral QHS   primidone   50 mg Oral BID   sodium chloride  flush  3 mL Intravenous Q12H   tamsulosin   0.4 mg Oral Daily   venlafaxine  XR  150 mg Oral Daily    PRN meds: sodium chloride , diphenhydrAMINE  **OR** diphenhydrAMINE , hydrALAZINE , HYDROmorphone  (DILAUDID ) injection, loperamide , magic mouthwash, melatonin, menthol -cetylpyridinium, metoprolol  tartrate, ondansetron  **OR** ondansetron  (ZOFRAN ) IV, mouth rinse, phenol, simethicone , sodium chloride  flush, traMADol    Infusions:   sodium chloride       Antimicrobials: Anti-infectives (From admission, onward)    Start     Dose/Rate Route Frequency Ordered Stop   01/09/24 2200  cefoTEtan  (CEFOTAN ) 2 g in sodium chloride  0.9 % 100 mL IVPB        2 g 200 mL/hr over 30 Minutes Intravenous Every 12 hours 01/09/24 1427 01/09/24 2247   01/09/24 0715  cefoTEtan  (CEFOTAN ) 2 g in sodium chloride  0.9 % 100 mL IVPB        2 g 200 mL/hr over 30 Minutes Intravenous On call to O.R. 01/09/24 0700 01/09/24 1429       Objective: Vitals:   01/12/24 2134 01/13/24 0514  BP: 114/78 104/82  Pulse: 100 98  Resp: 15 16  Temp: 98.4 F (36.9 C) 98.2 F (36.8 C)  SpO2: 100% 96%    Intake/Output Summary (Last 24 hours) at 01/13/2024 1203 Last data filed at 01/13/2024 1000 Gross per 24 hour  Intake 2196.47 ml  Output 3685 ml  Net -1488.53 ml   Filed Weights   01/09/24 1428  01/10/24 0529 01/12/24 0500  Weight: 101.9 kg 103.3 kg 102.7 kg   Weight change:  Body mass index is 36.54 kg/m.   Physical Exam: General exam: Pleasant, elderly Caucasian male.  Not in distress Skin: No rashes, lesions or ulcers. HEENT: Atraumatic, normocephalic, no obvious bleeding Lungs: Clear to auscultation bilaterally,  CVS: S1, S2, no murmur,   GI/Abd: Soft, nontender, nondistended, bowel sound present, colostomy bag on right lower quadrant with greenish liquid output CNS: Alert, awake, oriented x 3 Psychiatry: Mood appropriate Extremities: No pedal edema, no calf tenderness,   Data Review: I have personally reviewed the laboratory data and studies  available.  F/u labs  Unresulted Labs (From admission, onward)     Start     Ordered   01/16/24 0500  Creatinine, serum  (enoxaparin  (LOVENOX )  CrCl >/= 30 mL/min  )  Weekly,   R     Comments: while on enoxaparin  therapy.    01/09/24 1427            Signed, Chapman Rota, MD [facility] 01/13/2024

## 2024-01-13 NOTE — Progress Notes (Signed)
 01/13/2024  Calvin Escobar [id number] November 07, 1950  CARE TEAM: PCP: [name], MD  Outpatient Care Team: Patient Care Team: [name], MD as PCP - General (Internal Medicine) [name], MD as PCP - Cardiology (Cardiology) [name], MD (Dermatology) [facility] (Behavioral Health) Ezzard Rolin BIRCH, LCSW as VBCI Care Management (Licensed Clinical Social Worker) [name] (Psychiatry) [name], [name] LABOR, MD (Psychiatry) Legrand Victory LITTIE MOULD, MD as Consulting Physician (Gastroenterology) Sheldon Standing, MD as Consulting Physician (Colon and Rectal Surgery) Ezzard Rolin BIRCH KEN (Licensed Clinical Social Worker) Carolee Sherwood BIRCH MOULD, MD as Consulting Physician (Urology) Daneen Damien BROCKS, NP as Nurse Practitioner (Cardiology)  Inpatient Treatment Team: Treatment Team:  Sheldon Standing, MD Carolee Sherwood BIRCH MOULD, MD Massie Delaine SAUNDERS, RN [name], MD [address], Amos File, RN [place]ah[place], [place], MD [place], [place], NT [place], Leeroy Merlynn BROCKS, RN   Problem List:   Principal Problem:   Colovesical fistula Active Problems:   Bipolar affective disorder, depressed (HCC)   Dementia (HCC)   Chronic HFrEF (heart failure with reduced ejection fraction) (HCC)   Chronic bronchitis (HCC)   HLD (hyperlipidemia)   OSA treated with BiPAP   BPH (benign prostatic hyperplasia)   Primary insomnia   Hypertension   Chronic kidney disease, stage 3a (HCC)   Stricture of male urethra   Stricture of sigmoid colon (HCC)   History of COVID-19   History of urinary retention   Chronic indwelling Foley catheter   Ileostomy in place (HCC)   01/09/2024  POST-OPERATIVE DIAGNOSIS:   COLOVESICAL FISTULA WITH ABSCESS RECTOSIGMOID STRICTURE   PROCEDURE:   -ROBOTIC LOW ANTERIOR RECTOSIGMOID RESECTION (LAR) WITH ANASTOMOSIS -COLOVESICAL FISTULA TAKEDOWN & REPAIR -DIVERTING LOOP ILEOSTOMY (DLI),  =DRAINAGE OF PELVIC ABSCESS -LYSIS OF ADHESIONS  x45 MINUTES (30% OF CASE) -INTRAOPERATIVE ASSESSMENT OF TISSUE VASCULAR PERFUSION USING ICG (indocyanine green ) IMMUNOFLUORESCENCE -TRANSVERSUS ABDOMINIS PLANE (TAP) BLOCK - BILATERAL -FLEXIBLE SIGMOIDOSCOPY   SURGEON:  [name], MD  OR FINDINGS:   Patient had very thickened rectosigmoid colon folded and twisted upon itself with stricturing.  Extremely dense adhesions to the left dome of the bladder with 4 x 3 x 3 cm abscess.  Abscess decompressed.  No active persistent opening of the bladder on insufflation.   No obvious metastatic disease on visceral parietal peritoneum or liver.   It is a 75mm EEA anastomosis ( distal descending colon  connected to proximal rectum.)  It rests 75 cm from the anal verge by flexible sigmoidoscopy    Assessment [facility] Stay = 4 days) 4 Days Post-Op    Improved    Plan:  -ERAS protocol  -Remove dressings  -Solid diet as tolerated.  Apparently he is a very picky eater.  Will see if nutrition has other options to help.  I suspect he could benefit from supplemental shakes but he tends to avoid them as well.    Follow-up on pathology.  Most likely consistent with diverticulitis with chronic stricture causing the colovesical fistula  Diverting loop ileostomy in place.  Having significant output.  Due to usual ileostomy regimen of fiber/iron /Imodium  twice daily.  Increase Imodium  as needed to have the ileostomy output less than a liter a day.  I discontinued his furosemide  diuretics to avoid him getting too dehydrated.  Follow creatinine since he already has some chronic kidney disease at baseline to make sure he does not get too dehydrated.  Acute postoperative blood loss anemia in the setting of chronic disease.  IV iron  8/9.  Add oral iron  twice daily as well try to avoid perioperative transfusion.  Follow hemoglobin  -monitor electrolytes & replace as needed. Keep K>4, Mg>2, Phos>3  History of hypertension on numerous medications.  Most  likely will have to back off and hold parameters given his softer blood pressure.  See what medicine thinks.  Keep Foley catheter given chronic urinary retention in the setting of urethral stricture and colovesical fistula per [facility].  Of asked urology to make sure there is an appointment for the patient to follow-up for bladder function reevaluation and most likely in the next 2-4 weeks from discharge.  Defer to Dr. Carolee  -VTE prophylaxis- SCDs.  Anticoagulation prophyllaxis SQ as appropriate.  May need to hold if hemoglobin gets below 7  -mobilize as tolerated to help recovery.  Enlist therapies in moderate/high risk patients as appropriate.  Physical therapy and Occupational Therapy ordered.  [facility] internal medicine consultation to help follow given the patient's complexity.  I updated the patient's status to the patient and nurse patient's wife was at bedside so was able to update and give a long discussion about surgical interventions, reasoning, postoperative recommendations and plans.  Recommendations were made.  Questions were answered.  They expressed understanding & appreciation.  -Disposition: Close to being able to be discharged out of the hospital.  Hopefully as soon as tomorrow Tuesday 8/12 if skilled nursing facility bed is available. Disposition:  The patient is from: Home Anticipate discharge to:  Skilled Nursing Facility (SNF) Anticipated Date of Discharge is:  August 12,2025   Barriers to discharge:  Pending Clinical improvement (more likely than not), Therapy assessment & Recommendations pending, and Consultant clearance & sign off    Patient currently is NOT MEDICALLY STABLE for discharge from the hospital from a surgery standpoint.      I reviewed nursing notes, hospitalist notes, last 24 h vitals and pain scores, last 48 h intake and output, last 24 h labs and trends, and last 24 h imaging results.  I have reviewed this patient's available data, including  medical history, events of note, test results, etc as part of my evaluation.   A significant portion of that time was spent in counseling. Care during the described time interval was provided by me.  This care required moderate level of medical decision making.  01/13/2024    Subjective: (Chief complaint)  Resting comfortably.  No major events.  Taking in some p.o.  Objective:  Vital signs:  Vitals:   01/12/24 0938 01/12/24 1418 01/12/24 2134 01/13/24 0514  BP: 133/78 133/73 114/78 104/82  Pulse: 91 99 100 98  Resp: 16 16 15 16   Temp: 97.8 F (36.6 C) 99.1 F (37.3 C) 98.4 F (36.9 C) 98.2 F (36.8 C)  TempSrc:  Oral Oral Oral  SpO2: 95% 93% 100% 96%  Weight:      Height:        Last BM Date :  (Ilostomy)  Intake/Output   Yesterday:  08/10 0701 - 08/11 0700 In: 1080 [P.O.:1080] Out: 3040 [Urine:2200; Drains:90; Stool:750] This shift:  Total I/O In: 480 [P.O.:480] Out: 1315 [Urine:1000; Drains:15; Stool:300]  Bowel function:  Flatus: YES  BM:  YES - thin succuss  Drain: Serosanguinous-19 [place] drain goes into pelvis   Physical Exam:  General: Pt sleeping but awake/alert in no acute distress Eyes: PERRL, normal EOM.  Sclera clear.  No icterus Neuro: CN II-XII intact w/o focal sensory/motor deficits. Lymph: No head/neck/groin lymphadenopathy Psych:  No  delerium/psychosis/paranoia.  Oriented x 4 HENT: Normocephalic, Mucus membranes moist.  No thrush Neck: Supple, No tracheal deviation.  No obvious thyromegaly Chest: No pain to chest wall compression.  Good respiratory excursion.  No audible wheezing CV:  Pulses intact.  Regular rhythm.  No major extremity edema MS: Normal AROM mjr joints.  No obvious deformity  Abdomen: Soft.  Nondistended.  Nontender.  No evidence of peritonitis.  No incarcerated hernias. Right infraumbilical paramedian diverting loop ileostomy in place.  Pink with some edema.  Gas and mildly thickened effluent in bag  Ext:    No deformity.  No mjr edema.  No cyanosis Skin: No petechiae / purpurea.  No major sores.  Warm and dry    Results:   Cultures: Recent Results (from the past 720 hours)  Urine Culture     Status: Abnormal   Collection Time: 12/27/23  1:20 PM  Result Value Ref Range Status   MICRO NUMBER: [id number]  Final   SPECIMEN QUALITY: Adequate  Final   Sample Source URINE  Final   STATUS: FINAL  Final   ISOLATE 1: Enterococcus faecalis (A)  Final    Comment: 10,000-49,000 CFU/mL of Enterococcus faecalis      Susceptibility   Enterococcus faecalis - URINE CULTURE POSITIVE 1    AMPICILLIN  <=2 Sensitive     VANCOMYCIN  1 Sensitive     NITROFURANTOIN* <=16 Sensitive      * Legend: S = Susceptible  I = Intermediate R = Resistant  NS = Not susceptible SDD = Susceptible Dose Dependent * = Not Tested  NR = Not Reported **NN = See Therapy Comments   MRSA Next Gen by PCR, Nasal     Status: None   Collection Time: 01/09/24  3:37 PM   Specimen: Nasal Mucosa; Nasal Swab  Result Value Ref Range Status   MRSA by PCR Next Gen NOT DETECTED NOT DETECTED Final    Comment: (NOTE) The GeneXpert MRSA Assay (FDA approved for NASAL specimens only), is one component of a comprehensive MRSA colonization surveillance program. It is not intended to diagnose MRSA infection nor to guide or monitor treatment for MRSA infections. Test performance is not FDA approved in patients less than 42 years old. Performed at [facility], [address] [place], [place] [address]     Labs: Results for orders placed or performed during the hospital encounter of 01/09/24 (from the past 48 hours)  Glucose, capillary     Status: Abnormal   Collection Time: 01/11/24  8:28 AM  Result Value Ref Range   Glucose-Capillary 104 (H) 70 - 99 mg/dL    Comment: Glucose reference range applies only to samples taken after fasting for at least 8 hours.  Glucose, capillary     Status: Abnormal   Collection Time:  01/11/24 11:58 AM  Result Value Ref Range   Glucose-Capillary 142 (H) 70 - 99 mg/dL    Comment: Glucose reference range applies only to samples taken after fasting for at least 8 hours.  CBC     Status: Abnormal   Collection Time: 01/11/24  1:10 PM  Result Value Ref Range   WBC 9.5 4.0 - 10.5 K/uL   RBC 2.69 (L) 4.22 - 5.81 MIL/uL   Hemoglobin 7.3 (L) 13.0 - 17.0 g/dL   HCT 74.3 (L) 60.9 - 47.9 %   MCV 95.2 80.0 - 100.0 fL   MCH 27.1 26.0 - 34.0 pg   MCHC 28.5 (L) 30.0 - 36.0 g/dL  RDW 14.8 11.5 - 15.5 %   Platelets 167 150 - 400 K/uL   nRBC 0.0 0.0 - 0.2 %    Comment: Performed at [facility], [address] [place], [place] [address]  Creatinine, serum     Status: Abnormal   Collection Time: 01/11/24  1:10 PM  Result Value Ref Range   Creatinine, Ser 1.51 (H) 0.61 - 1.24 mg/dL   GFR, Estimated 48 (L) >60 mL/min    Comment: (NOTE) Calculated using the CKD-EPI Creatinine Equation (2021) Performed at [facility], [address] [place], [place] [address]   Potassium     Status: None   Collection Time: 01/11/24  1:10 PM  Result Value Ref Range   Potassium 4.3 3.5 - 5.1 mmol/L    Comment: Performed at [facility], [address] [place], [place] [address]  Glucose, capillary     Status: Abnormal   Collection Time: 01/11/24  5:07 PM  Result Value Ref Range   Glucose-Capillary 143 (H) 70 - 99 mg/dL    Comment: Glucose reference range applies only to samples taken after fasting for at least 8 hours.  Glucose, capillary     Status: Abnormal   Collection Time: 01/11/24  9:53 PM  Result Value Ref Range   Glucose-Capillary 121 (H) 70 - 99 mg/dL    Comment: Glucose reference range applies only to samples taken after fasting for at least 8 hours.  Glucose, capillary     Status: None   Collection Time: 01/12/24  1:02 AM  Result Value Ref Range   Glucose-Capillary 99 70 - 99 mg/dL    Comment: Glucose reference range  applies only to samples taken after fasting for at least 8 hours.  Glucose, capillary     Status: None   Collection Time: 01/12/24  7:52 AM  Result Value Ref Range   Glucose-Capillary 96 70 - 99 mg/dL    Comment: Glucose reference range applies only to samples taken after fasting for at least 8 hours.  CBC     Status: Abnormal   Collection Time: 01/12/24 10:38 AM  Result Value Ref Range   WBC 8.1 4.0 - 10.5 K/uL   RBC 2.70 (L) 4.22 - 5.81 MIL/uL   Hemoglobin 7.5 (L) 13.0 - 17.0 g/dL   HCT 74.7 (L) 60.9 - 47.9 %   MCV 93.3 80.0 - 100.0 fL   MCH 27.8 26.0 - 34.0 pg   MCHC 29.8 (L) 30.0 - 36.0 g/dL   RDW 85.1 88.4 - 84.4 %   Platelets 195 150 - 400 K/uL   nRBC 0.0 0.0 - 0.2 %    Comment: Performed at [facility], [address], [place], [place] [address]  Creatinine, serum     Status: Abnormal   Collection Time: 01/12/24 10:38 AM  Result Value Ref Range   Creatinine, Ser 1.27 (H) 0.61 - 1.24 mg/dL   GFR, Estimated 59 (L) >60 mL/min    Comment: (NOTE) Calculated using the CKD-EPI Creatinine Equation (2021) Performed at [facility], [address] [place], [place] [address]   Potassium     Status: None   Collection Time: 01/12/24 10:38 AM  Result Value Ref Range   Potassium 4.2 3.5 - 5.1 mmol/L    Comment: Performed at [facility], [address], [place], [place] [address]  Glucose, capillary     Status: Abnormal   Collection Time: 01/12/24 11:49 AM  Result Value  Ref Range   Glucose-Capillary 105 (H) 70 - 99 mg/dL    Comment: Glucose reference range applies only to samples taken after fasting for at least 8 hours.  Glucose, capillary     Status: Abnormal   Collection Time: 01/12/24  4:30 PM  Result Value Ref Range   Glucose-Capillary 125 (H) 70 - 99 mg/dL    Comment: Glucose reference range applies only to samples taken after fasting for at least 8 hours.  Glucose, capillary     Status: Abnormal   Collection Time:  01/12/24  9:35 PM  Result Value Ref Range   Glucose-Capillary 120 (H) 70 - 99 mg/dL    Comment: Glucose reference range applies only to samples taken after fasting for at least 8 hours.  CBC     Status: Abnormal   Collection Time: 01/13/24  4:42 AM  Result Value Ref Range   WBC 8.8 4.0 - 10.5 K/uL   RBC 2.66 (L) 4.22 - 5.81 MIL/uL   Hemoglobin 7.2 (L) 13.0 - 17.0 g/dL   HCT 75.0 (L) 60.9 - 47.9 %   MCV 93.6 80.0 - 100.0 fL   MCH 27.1 26.0 - 34.0 pg   MCHC 28.9 (L) 30.0 - 36.0 g/dL   RDW 85.0 88.4 - 84.4 %   Platelets 250 150 - 400 K/uL   nRBC 0.0 0.0 - 0.2 %    Comment: Performed at [facility], [address] [place], [place] [address]  Creatinine, serum     Status: Abnormal   Collection Time: 01/13/24  4:42 AM  Result Value Ref Range   Creatinine, Ser 1.44 (H) 0.61 - 1.24 mg/dL   GFR, Estimated 51 (L) >60 mL/min    Comment: (NOTE) Calculated using the CKD-EPI Creatinine Equation (2021) Performed at [facility], [address] [place], [place] [address]   Potassium     Status: None   Collection Time: 01/13/24  4:42 AM  Result Value Ref Range   Potassium 4.1 3.5 - 5.1 mmol/L    Comment: Performed at [facility], [address], [place], [place] [address]    Imaging / Studies: No results found.   Medications / Allergies: per chart  Antibiotics: Anti-infectives (From admission, onward)    Start     Dose/Rate Route Frequency Ordered Stop   01/09/24 2200  cefoTEtan  (CEFOTAN ) 2 g in sodium chloride  0.9 % 100 mL IVPB        2 g 200 mL/hr over 30 Minutes Intravenous Every 12 hours 01/09/24 1427 01/09/24 2247   01/09/24 0715  cefoTEtan  (CEFOTAN ) 2 g in sodium chloride  0.9 % 100 mL IVPB        2 g 200 mL/hr over 30 Minutes Intravenous On call to O.R. 01/09/24 0700 01/09/24 1429         Note: Portions of this report may have been transcribed using voice recognition software. Every effort was made to ensure  accuracy; however, inadvertent computerized transcription errors may be present.   Any transcriptional errors that result from this process are unintentional.    Elspeth KYM Schultze, MD, FACS, MASCRS Esophageal, Gastrointestinal & Colorectal Surgery Robotic and Minimally Invasive Surgery  [facility] [address] [place], [place] [address] [phone] Fax [phone] [address]  CONTACT INFORMATION: Weekday (9AM-5PM): Call [facility] main office at [phone] Weeknight (5PM-9AM) or Weekend/Holiday: Check EPIC Web Links tab & use AMION (password  [facility]) for General Surgery  CCS coverage  Please, DO NOT use SecureChat  (it is not reliable communication to reach operating surgeons & will lead to a delay in care).   Epic staff messaging available for outptient concerns needing 1-2 business day response.      01/13/2024  6:47 AM

## 2024-01-13 NOTE — TOC PASRR Note (Signed)
 30 Day PASRR Note   Patient Details  Name: Calvin Escobar Date of Birth: [birth year]   Transition [facility]) CM/SW Contact:    Alfonse JONELLE Rex, RN Phone Number: 01/13/2024, 11:23 AM  To Whom It May Concern:  Please be advised that this patient will require a short-term nursing home stay - anticipated 30 days or less for rehabilitation and strengthening.   The plan is for return home.

## 2024-01-13 NOTE — Plan of Care (Signed)
  Problem: Education: Goal: Understanding of discharge needs will improve Outcome: Progressing Goal: Verbalization of understanding of the causes of altered bowel function will improve Outcome: Progressing   Problem: Activity: Goal: Ability to tolerate increased activity will improve Outcome: Progressing   Problem: Bowel/Gastric: Goal: Gastrointestinal status for postoperative course will improve Outcome: Progressing   Problem: Health Behavior/Discharge Planning: Goal: Identification of community resources to assist with postoperative recovery needs will improve Outcome: Progressing   Problem: Nutritional: Goal: Will attain and maintain optimal nutritional status will improve Outcome: Progressing   Problem: Clinical Measurements: Goal: Postoperative complications will be avoided or minimized Outcome: Progressing   Problem: Respiratory: Goal: Respiratory status will improve Outcome: Progressing   Problem: Skin Integrity: Goal: Will show signs of wound healing Outcome: Progressing   Problem: Education: Goal: Knowledge of General Education information will improve Description: Including pain rating scale, medication(s)/side effects and non-pharmacologic comfort measures Outcome: Progressing   Problem: Health Behavior/Discharge Planning: Goal: Ability to manage health-related needs will improve Outcome: Progressing   Problem: Clinical Measurements: Goal: Ability to maintain clinical measurements within normal limits will improve Outcome: Progressing Goal: Will remain free from infection Outcome: Progressing Goal: Diagnostic test results will improve Outcome: Progressing Goal: Respiratory complications will improve Outcome: Progressing Goal: Cardiovascular complication will be avoided Outcome: Progressing   Problem: Activity: Goal: Risk for activity intolerance will decrease Outcome: Progressing   Problem: Nutrition: Goal: Adequate nutrition will be  maintained Outcome: Progressing   Problem: Coping: Goal: Level of anxiety will decrease Outcome: Progressing   Problem: Elimination: Goal: Will not experience complications related to bowel motility Outcome: Progressing Goal: Will not experience complications related to urinary retention Outcome: Progressing   Problem: Pain Managment: Goal: General experience of comfort will improve and/or be controlled Outcome: Progressing   Problem: Safety: Goal: Ability to remain free from injury will improve Outcome: Progressing   Problem: Skin Integrity: Goal: Risk for impaired skin integrity will decrease Outcome: Progressing   Problem: Education: Goal: Ability to describe self-care measures that may prevent or decrease complications (Diabetes Survival Skills Education) will improve Outcome: Progressing Goal: Individualized Educational Video(s) Outcome: Progressing   Problem: Coping: Goal: Ability to adjust to condition or change in health will improve Outcome: Progressing   Problem: Fluid Volume: Goal: Ability to maintain a balanced intake and output will improve Outcome: Progressing   Problem: Health Behavior/Discharge Planning: Goal: Ability to identify and utilize available resources and services will improve Outcome: Progressing Goal: Ability to manage health-related needs will improve Outcome: Progressing   Problem: Metabolic: Goal: Ability to maintain appropriate glucose levels will improve Outcome: Progressing   Problem: Nutritional: Goal: Maintenance of adequate nutrition will improve Outcome: Progressing Goal: Progress toward achieving an optimal weight will improve Outcome: Progressing   Problem: Skin Integrity: Goal: Risk for impaired skin integrity will decrease Outcome: Progressing   Problem: Tissue Perfusion: Goal: Adequacy of tissue perfusion will improve Outcome: Progressing

## 2024-01-13 NOTE — Plan of Care (Signed)
  Problem: Education: Goal: Understanding of discharge needs will improve Outcome: Progressing Goal: Verbalization of understanding of the causes of altered bowel function will improve Outcome: Progressing   Problem: Activity: Goal: Ability to tolerate increased activity will improve Outcome: Progressing

## 2024-01-13 NOTE — NC FL2 (Addendum)
 [place]  MEDICAID FL2 LEVEL OF CARE FORM     IDENTIFICATION  Patient Name: Calvin Escobar Birthdate: May 04, 1951 Sex: male Admission Date (Current Location): 01/09/2024  [facility] and [place] Number:  Producer, television/film/video and Address:  [facility],  [address], [place] [address]      Provider Number: [id number]  Attending Physician Name and Address:  Sheldon Standing, MD  Relative Name and Phone Number:  Zakaria, Sedor (Spouse)  [phone] (Mobile)    Current Level of Care: Hospital Recommended Level of Care: Skilled Nursing Facility Prior Approval Number:    Date Approved/Denied:   PASRR Number: [id number]  Discharge Plan: SNF    Current Diagnoses: Patient Active Problem List   Diagnosis Date Noted   Ileostomy in place [facility]) 01/11/2024   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   History of COVID-19 01/09/2024   History of urinary retention 01/09/2024   Chronic indwelling Foley catheter 01/09/2024   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   Chronic HFrEF (heart failure with reduced ejection fraction) (HCC) 12/13/2023   Hematuria 12/02/2023   Medication management 11/05/2023   Hydronephrosis of left kidney 09/28/2023   Prostatic mass 09/28/2023   Weight loss, abnormal 08/14/2023   Hypocalcemia 08/14/2023   Hypoalbuminemia 08/14/2023   GERD (gastroesophageal reflux disease) 07/13/2023   Recurrent UTI 07/01/2023   LBBB (left bundle branch block) 06/02/2023   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   Obesity (BMI 30-39.9) 05/13/2023   Tremor 05/08/2023   Lack of appetite 05/08/2023   Chronic kidney disease, stage 3a (HCC) 03/07/2023   Anemia of chronic disease 03/07/2023   Limp 12/19/2022   Gynecomastia, male 12/19/2022   Chronic back pain 12/19/2022   Hypertension 11/21/2022   Generalized arthritis 07/18/2022   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   DCM (dilated cardiomyopathy) (HCC)    OSA treated with BiPAP    Primary insomnia  08/05/2017   Dementia (HCC)    HLD (hyperlipidemia)    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   BPH (benign prostatic hyperplasia) 03/27/2012   Morbid obesity (HCC) 03/27/2012   Bipolar affective disorder, depressed (HCC) 05/10/2007   Chronic bronchitis (HCC) 05/10/2007    Orientation RESPIRATION BLADDER Height & Weight     Self, Time, Situation, Place  Normal Continent, External catheter Weight: 102.7 kg Height:  5' 6 (167.6 cm)  BEHAVIORAL SYMPTOMS/MOOD NEUROLOGICAL BOWEL NUTRITION STATUS      Ileostomy Diet (soft diet)  AMBULATORY STATUS COMMUNICATION OF NEEDS Skin   Limited Assist Verbally Normal                       Personal Care Assistance Level of Assistance  Bathing, Feeding, Dressing Bathing Assistance: Limited assistance Feeding assistance: Limited assistance Dressing Assistance: Limited assistance     Functional Limitations Info  Sight, Hearing, Speech Sight Info: Impaired (eyeglasses) Hearing Info: Impaired (hard of hearing) Speech Info: Adequate    SPECIAL CARE FACTORS FREQUENCY  PT (By licensed PT), OT (By licensed OT)     PT Frequency: 5x/wk OT Frequency: 5x/wk            Contractures Contractures Info: Not present    Additional Factors Info  Code Status, Allergies, Psychotropic Code Status Info: Full Code Allergies Info: Albuterol  Psychotropic Info: Abilify  2mg  po daily; Buspar  10mg  po TID         Current Medications (01/13/2024):  This is the current hospital active medication list Current Facility-Administered Medications  Medication Dose Route Frequency Provider Last Rate Last Admin  0.9 %  sodium chloride  infusion  250 mL Intravenous PRN Sheldon Standing, MD       acetaminophen  (TYLENOL ) tablet 1,000 mg  1,000 mg Oral Q6H Gross, Steven, MD   1,000 mg at 01/13/24 9478   ARIPiprazole  (ABILIFY ) tablet 2 mg  2 mg Oral q AM Sheldon Standing, MD   2 mg at 01/13/24 9047   busPIRone  (BUSPAR ) tablet 10 mg  10 mg Oral TID Sheldon Standing, MD    10 mg at 01/13/24 9046   carvedilol  (COREG ) tablet 6.25 mg  6.25 mg Oral BID Sheldon Standing, MD   6.25 mg at 01/13/24 9046   Chlorhexidine  Gluconate Cloth 2 % PADS 6 each  6 each Topical Daily Sheldon Standing, MD   6 each at 01/13/24 0955   diphenhydrAMINE  (BENADRYL ) 12.5 MG/5ML elixir 12.5 mg  12.5 mg Oral Q6H PRN Sheldon Standing, MD       Or   diphenhydrAMINE  (BENADRYL ) injection 12.5 mg  12.5 mg Intravenous Q6H PRN Sheldon Standing, MD       enoxaparin  (LOVENOX ) injection 40 mg  40 mg Subcutaneous Q24H Sheldon Standing, MD   40 mg at 01/13/24 0804   feeding supplement (ENSURE SURGERY) liquid 237 mL  237 mL Oral BID BM Sheldon Standing, MD   237 mL at 01/13/24 0955   ferrous sulfate  tablet 325 mg  325 mg Oral BID WC Sheldon Standing, MD   325 mg at 01/13/24 9196   finasteride  (PROSCAR ) tablet 5 mg  5 mg Oral Daily Sheldon Standing, MD   5 mg at 01/13/24 9046   folic acid  (FOLVITE ) tablet 1 mg  1 mg Oral Daily Sheldon Standing, MD   1 mg at 01/13/24 9046   gabapentin  (NEURONTIN ) capsule 100 mg  100 mg Oral TID Sheldon Standing, MD   100 mg at 01/13/24 9047   hydrALAZINE  (APRESOLINE ) injection 10 mg  10 mg Intravenous Q2H PRN Sheldon Standing, MD       HYDROmorphone  (DILAUDID ) injection 0.5-2 mg  0.5-2 mg Intravenous Q4H PRN Sheldon Standing, MD   2 mg at 01/10/24 1147   insulin  aspart (novoLOG ) injection 0-15 Units  0-15 Units Subcutaneous TID WC Sheldon Standing, MD   2 Units at 01/12/24 1706   insulin  aspart (novoLOG ) injection 0-5 Units  0-5 Units Subcutaneous QHS Sheldon Standing, MD       loperamide  (IMODIUM ) capsule 2 mg  2 mg Oral BID Sheldon Standing, MD   2 mg at 01/13/24 9046   loperamide  (IMODIUM ) capsule 2-4 mg  2-4 mg Oral Q6H PRN Sheldon Standing, MD       magic mouthwash  15 mL Oral QID PRN Sheldon Standing, MD       melatonin tablet 3 mg  3 mg Oral QHS PRN Sheldon Standing, MD       menthol -cetylpyridinium (CEPACOL) lozenge 3 mg  1 lozenge Oral PRN Sheldon Standing, MD       metoprolol  tartrate (LOPRESSOR ) injection 5 mg  5 mg  Intravenous Q6H PRN Sheldon Standing, MD       mirtazapine  (REMERON ) tablet 30 mg  30 mg Oral QHS Gross, Steven, MD   30 mg at 01/12/24 2147   multivitamin with minerals tablet 1 tablet  1 tablet Oral Daily Sheldon Standing, MD   1 tablet at 01/13/24 9047   ondansetron  (ZOFRAN ) tablet 4 mg  4 mg Oral Q6H PRN Sheldon Standing, MD   4 mg at 01/09/24 1500   Or   ondansetron  (ZOFRAN ) injection 4 mg  4 mg Intravenous Q6H PRN Sheldon Standing, MD       Oral care mouth rinse  15 mL Mouth Rinse PRN Gross, Standing, MD       phenol (CHLORASEPTIC) mouth spray 2 spray  2 spray Mouth/Throat PRN Sheldon Standing, MD       polycarbophil (FIBERCON) tablet 625 mg  625 mg Oral BID Sheldon Standing, MD   625 mg at 01/13/24 9047   pravastatin  (PRAVACHOL ) tablet 20 mg  20 mg Oral QHS Gross, Steven, MD   20 mg at 01/12/24 2147   primidone  (MYSOLINE ) tablet 50 mg  50 mg Oral BID Sheldon Standing, MD   50 mg at 01/13/24 9047   simethicone  (MYLICON) chewable tablet 40 mg  40 mg Oral Q6H PRN Sheldon Standing, MD       sodium chloride  flush (NS) 0.9 % injection 3 mL  3 mL Intravenous Q12H Gross, Steven, MD   3 mL at 01/13/24 0955   sodium chloride  flush (NS) 0.9 % injection 3 mL  3 mL Intravenous PRN Sheldon Standing, MD   3 mL at 01/11/24 1139   tamsulosin  (FLOMAX ) capsule 0.4 mg  0.4 mg Oral Daily Sheldon Standing, MD   0.4 mg at 01/13/24 9046   traMADol  (ULTRAM ) tablet 50-100 mg  50-100 mg Oral Q6H PRN Sheldon Standing, MD   100 mg at 01/13/24 9046   venlafaxine  XR (EFFEXOR -XR) 24 hr capsule 150 mg  150 mg Oral Daily Sheldon Standing, MD   150 mg at 01/13/24 9046     Discharge Medications: Please see discharge summary for a list of discharge medications.  Relevant Imaging Results:  Relevant Lab Results:   Additional Information SSN: [id number]  Alfonse JONELLE Rex, RN

## 2024-01-14 ENCOUNTER — Ambulatory Visit: Payer: Self-pay | Admitting: Surgery

## 2024-01-14 DIAGNOSIS — R6520 Severe sepsis without septic shock: Secondary | ICD-10-CM | POA: Diagnosis not present

## 2024-01-14 DIAGNOSIS — I5043 Acute on chronic combined systolic (congestive) and diastolic (congestive) heart failure: Secondary | ICD-10-CM | POA: Diagnosis not present

## 2024-01-14 DIAGNOSIS — F0394 Unspecified dementia, unspecified severity, with anxiety: Secondary | ICD-10-CM | POA: Diagnosis not present

## 2024-01-14 DIAGNOSIS — R531 Weakness: Secondary | ICD-10-CM | POA: Diagnosis not present

## 2024-01-14 DIAGNOSIS — K5732 Diverticulitis of large intestine without perforation or abscess without bleeding: Secondary | ICD-10-CM | POA: Insufficient documentation

## 2024-01-14 DIAGNOSIS — R1084 Generalized abdominal pain: Secondary | ICD-10-CM | POA: Diagnosis not present

## 2024-01-14 DIAGNOSIS — R262 Difficulty in walking, not elsewhere classified: Secondary | ICD-10-CM | POA: Diagnosis not present

## 2024-01-14 DIAGNOSIS — E785 Hyperlipidemia, unspecified: Secondary | ICD-10-CM | POA: Diagnosis not present

## 2024-01-14 DIAGNOSIS — D638 Anemia in other chronic diseases classified elsewhere: Secondary | ICD-10-CM | POA: Diagnosis not present

## 2024-01-14 DIAGNOSIS — N136 Pyonephrosis: Secondary | ICD-10-CM | POA: Diagnosis not present

## 2024-01-14 DIAGNOSIS — R0603 Acute respiratory distress: Secondary | ICD-10-CM | POA: Diagnosis not present

## 2024-01-14 DIAGNOSIS — G4733 Obstructive sleep apnea (adult) (pediatric): Secondary | ICD-10-CM

## 2024-01-14 DIAGNOSIS — K219 Gastro-esophageal reflux disease without esophagitis: Secondary | ICD-10-CM | POA: Diagnosis not present

## 2024-01-14 DIAGNOSIS — A419 Sepsis, unspecified organism: Secondary | ICD-10-CM | POA: Diagnosis not present

## 2024-01-14 DIAGNOSIS — E871 Hypo-osmolality and hyponatremia: Secondary | ICD-10-CM | POA: Diagnosis not present

## 2024-01-14 DIAGNOSIS — N390 Urinary tract infection, site not specified: Secondary | ICD-10-CM | POA: Diagnosis not present

## 2024-01-14 DIAGNOSIS — U071 COVID-19: Secondary | ICD-10-CM | POA: Diagnosis not present

## 2024-01-14 DIAGNOSIS — D631 Anemia in chronic kidney disease: Secondary | ICD-10-CM | POA: Diagnosis not present

## 2024-01-14 DIAGNOSIS — Z7401 Bed confinement status: Secondary | ICD-10-CM | POA: Diagnosis not present

## 2024-01-14 DIAGNOSIS — A4151 Sepsis due to Escherichia coli [E. coli]: Secondary | ICD-10-CM | POA: Diagnosis not present

## 2024-01-14 DIAGNOSIS — B377 Candidal sepsis: Secondary | ICD-10-CM | POA: Diagnosis not present

## 2024-01-14 DIAGNOSIS — F0393 Unspecified dementia, unspecified severity, with mood disturbance: Secondary | ICD-10-CM | POA: Diagnosis not present

## 2024-01-14 DIAGNOSIS — E8729 Other acidosis: Secondary | ICD-10-CM | POA: Diagnosis not present

## 2024-01-14 DIAGNOSIS — N3000 Acute cystitis without hematuria: Secondary | ICD-10-CM | POA: Diagnosis not present

## 2024-01-14 DIAGNOSIS — N321 Vesicointestinal fistula: Secondary | ICD-10-CM | POA: Diagnosis not present

## 2024-01-14 DIAGNOSIS — N1831 Chronic kidney disease, stage 3a: Secondary | ICD-10-CM | POA: Diagnosis not present

## 2024-01-14 DIAGNOSIS — E440 Moderate protein-calorie malnutrition: Secondary | ICD-10-CM | POA: Insufficient documentation

## 2024-01-14 DIAGNOSIS — N179 Acute kidney failure, unspecified: Secondary | ICD-10-CM | POA: Diagnosis not present

## 2024-01-14 DIAGNOSIS — T839XXD Unspecified complication of genitourinary prosthetic device, implant and graft, subsequent encounter: Secondary | ICD-10-CM | POA: Diagnosis not present

## 2024-01-14 DIAGNOSIS — N400 Enlarged prostate without lower urinary tract symptoms: Secondary | ICD-10-CM | POA: Diagnosis not present

## 2024-01-14 LAB — GLUCOSE, CAPILLARY
Glucose-Capillary: 98 mg/dL (ref 70–99)
Glucose-Capillary: 134 mg/dL — ABNORMAL HIGH (ref 70–99)

## 2024-01-14 MED ORDER — LOPERAMIDE HCL 2 MG PO CAPS: 2.0000 mg | ORAL_CAPSULE | Freq: Four times a day (QID) | ORAL | 0 refills | Status: DC | PRN

## 2024-01-14 MED ORDER — LOPERAMIDE HCL 2 MG PO CAPS: 2.0000 mg | ORAL_CAPSULE | Freq: Two times a day (BID) | ORAL | 3 refills | Status: DC

## 2024-01-14 MED ORDER — FERROUS SULFATE 325 (65 FE) MG PO TABS: 325.0000 mg | ORAL_TABLET | Freq: Two times a day (BID) | ORAL | 2 refills | Status: AC

## 2024-01-14 MED ORDER — CALCIUM POLYCARBOPHIL 625 MG PO TABS: 625.0000 mg | ORAL_TABLET | Freq: Two times a day (BID) | ORAL | 6 refills | Status: DC

## 2024-01-14 MED ORDER — HYDROCODONE-ACETAMINOPHEN 5-325 MG PO TABS: 1.0000 | ORAL_TABLET | Freq: Four times a day (QID) | ORAL | 0 refills | Status: DC | PRN

## 2024-01-14 NOTE — Progress Notes (Signed)
 Report called to Vernell, RN at [employer].

## 2024-01-14 NOTE — TOC Progression Note (Addendum)
 Transition of Care [facility]) - Progression Note    Patient Details  Name: Calvin Escobar MRN: [id number] Date of Birth: 08-28-1950  [facility] CM/SW Contact  Alfonse JONELLE Rex, RN Phone Number: 01/14/2024, 11:56 AM  Clinical Narrative:  Call to patient's spouse, SNF bed offers reviewed telephonically [facility], [place], [address], [place], [facility], [place], [place]), spouse accepted bed offer at [facility], admit coord at SNF notified.   -12:21pm SNF auth initiated via Availity. Auth ID [id number], shara pending.   -1;26pm SNF auth approved per [facility] Auth ID [id number], Auth ID  [id number], day approved: 01/14/2024-01/16/2024. DC order and DC Summary in. Rhonda w[facility] confirmed bed available for admit today. Call to patient's spouse to notify, no answer, vm left with NCM name and phone number requesting call back.                        Expected Discharge Plan and Services         Expected Discharge Date: 01/14/24                                     Social Drivers of Health (SDOH) Interventions SDOH Screenings   Food Insecurity: No Food Insecurity (01/09/2024)  Housing: Low Risk  (01/09/2024)  Transportation Needs: No Transportation Needs (01/09/2024)  Utilities: Not At Risk (01/09/2024)  Alcohol Screen: Low Risk  (10/10/2023)  Depression (PHQ2-9): Low Risk  (12/09/2023)  Recent Concern: Depression (PHQ2-9) - Medium Risk (09/16/2023)  Financial Resource Strain: Low Risk  (10/10/2023)  Physical Activity: Sufficiently Active (10/10/2023)  Social Connections: Moderately Integrated (01/09/2024)  Recent Concern: Social Connections - Socially Isolated (12/11/2023)  Stress: No Stress Concern Present (10/10/2023)  Tobacco Use: Low Risk  (01/09/2024)  Health Literacy: Adequate Health Literacy (10/10/2023)    Readmission Risk Interventions    07/04/2023    1:27 PM 07/03/2023    2:48 PM 06/05/2023   10:12 AM  Readmission Risk  Prevention Plan  Transportation Screening Complete Complete Complete  PCP or Specialist Appt within 5-7 Days Complete Complete   PCP or Specialist Appt within 3-5 Days   Complete  Home Care Screening Complete Complete   Medication Review (RN CM) Complete Complete   HRI or Home Care Consult   Complete  Social Work Consult for Recovery Care Planning/Counseling   Complete  Palliative Care Screening   Complete  Medication Review [employer])   Complete

## 2024-01-14 NOTE — Plan of Care (Signed)
  Problem: Education: Goal: Understanding of discharge needs will improve Outcome: Adequate for Discharge Goal: Verbalization of understanding of the causes of altered bowel function will improve Outcome: Adequate for Discharge   Problem: Activity: Goal: Ability to tolerate increased activity will improve Outcome: Adequate for Discharge   Problem: Bowel/Gastric: Goal: Gastrointestinal status for postoperative course will improve Outcome: Adequate for Discharge   Problem: Health Behavior/Discharge Planning: Goal: Identification of community resources to assist with postoperative recovery needs will improve Outcome: Adequate for Discharge   Problem: Nutritional: Goal: Will attain and maintain optimal nutritional status will improve Outcome: Adequate for Discharge   Problem: Clinical Measurements: Goal: Postoperative complications will be avoided or minimized Outcome: Adequate for Discharge   Problem: Respiratory: Goal: Respiratory status will improve Outcome: Adequate for Discharge   Problem: Skin Integrity: Goal: Will show signs of wound healing Outcome: Adequate for Discharge   Problem: Education: Goal: Knowledge of General Education information will improve Description: Including pain rating scale, medication(s)/side effects and non-pharmacologic comfort measures Outcome: Adequate for Discharge   Problem: Health Behavior/Discharge Planning: Goal: Ability to manage health-related needs will improve Outcome: Adequate for Discharge   Problem: Clinical Measurements: Goal: Ability to maintain clinical measurements within normal limits will improve Outcome: Adequate for Discharge Goal: Will remain free from infection Outcome: Adequate for Discharge Goal: Diagnostic test results will improve Outcome: Adequate for Discharge Goal: Respiratory complications will improve Outcome: Adequate for Discharge Goal: Cardiovascular complication will be avoided Outcome: Adequate for  Discharge   Problem: Activity: Goal: Risk for activity intolerance will decrease Outcome: Adequate for Discharge   Problem: Nutrition: Goal: Adequate nutrition will be maintained Outcome: Adequate for Discharge   Problem: Coping: Goal: Level of anxiety will decrease Outcome: Adequate for Discharge   Problem: Elimination: Goal: Will not experience complications related to bowel motility Outcome: Adequate for Discharge Goal: Will not experience complications related to urinary retention Outcome: Adequate for Discharge   Problem: Pain Managment: Goal: General experience of comfort will improve and/or be controlled Outcome: Adequate for Discharge   Problem: Safety: Goal: Ability to remain free from injury will improve Outcome: Adequate for Discharge   Problem: Skin Integrity: Goal: Risk for impaired skin integrity will decrease Outcome: Adequate for Discharge   Problem: Education: Goal: Ability to describe self-care measures that may prevent or decrease complications (Diabetes Survival Skills Education) will improve Outcome: Adequate for Discharge Goal: Individualized Educational Video(s) Outcome: Adequate for Discharge   Problem: Coping: Goal: Ability to adjust to condition or change in health will improve Outcome: Adequate for Discharge   Problem: Fluid Volume: Goal: Ability to maintain a balanced intake and output will improve Outcome: Adequate for Discharge   Problem: Health Behavior/Discharge Planning: Goal: Ability to identify and utilize available resources and services will improve Outcome: Adequate for Discharge Goal: Ability to manage health-related needs will improve Outcome: Adequate for Discharge   Problem: Metabolic: Goal: Ability to maintain appropriate glucose levels will improve Outcome: Adequate for Discharge   Problem: Nutritional: Goal: Maintenance of adequate nutrition will improve Outcome: Adequate for Discharge Goal: Progress toward  achieving an optimal weight will improve Outcome: Adequate for Discharge   Problem: Skin Integrity: Goal: Risk for impaired skin integrity will decrease Outcome: Adequate for Discharge   Problem: Tissue Perfusion: Goal: Adequacy of tissue perfusion will improve Outcome: Adequate for Discharge

## 2024-01-14 NOTE — Care Management Important Message (Signed)
 Important Message  Patient Details IM Letter given. Name: Calvin Escobar MRN: [id number] Date of Birth: [birth year]   Important Message Given:  Yes - Medicare IM     Tianne Plott 01/14/2024, 10:50 AM

## 2024-01-14 NOTE — Progress Notes (Signed)
 PROGRESS NOTE  Calvin Escobar  DOB: 1951-06-03  PCP: [name], MD FMW:[id number]  DOA: 01/09/2024  LOS: 5 days  Hospital Day: 6  Brief narrative: Calvin Escobar is a 73 y.o. male with PMH significant for obesity, OSA on BiPAP, HTN, HLD, CHF, nonischemic dilated cardiomyopathy, CKD, dementia, anxiety/depression, hyponatremia, C. 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 coli bacteremia. Most recent CT abdomen from 12/02/2023 suggested chronic colovesical fistula  He was seen by urology and general surgery as inpatient as well as outpatient.  Ultimately recommended for colorectal surgery 8/7, patient underwent robot-assisted sigmoid colectomy and creation of loop colostomy by general surgery for sigmoid colon stricture and colovesical fistula. Hospitalist service was consulted for management of medical issues   Subjective: Patient was seen and examined this morning. Sitting up in recliner.  Not in distress. It seems patient is planned for discharge by primary team general surgery today.  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 Sheldon 8/7 Has greenish liquid output in colostomy bag.   Continue postop management per primary team. Analgesics and antiemetics as needed.  Hypomagnesemia Replacement was given.  Continue to monitor electrolytes Recent Labs  Lab 01/09/24 1524 01/10/24 0434 01/11/24 1310 01/12/24 1038 01/13/24 0442  K 4.1 4.4 4.3 4.2 4.1  MG 1.8 1.6*  --   --   --   PHOS 3.5  --   --   --   --    CKD 3a Creatinine at baseline Recent Labs    12/13/23 0657 12/14/23 0600 12/15/23 0821 12/16/23 0615 12/17/23 0601 12/18/23 0507 12/27/23 1320 01/01/24 1423 01/09/24 1524 01/10/24 0434  01/11/24 1310 01/12/24 1038 01/13/24 0442  BUN 52* 66* 56* 43* 44* 41* 21 21 15 16   --   --   --   CREATININE 2.75* 2.41* 1.99* 1.85* 1.89* 1.94* 1.77* 1.41* 1.54* 1.66* 1.51* 1.27* 1.44*  CO2 21* 20* 22 27 26 27 27 23  20* 22  --   --   --    Acute on chronic anemia Baseline hemoglobin above 9.  Postsurgically, hemoglobin has been running stable between 7 and 8.  Has not required blood transfusion so far. Continue to monitor as an outpatient. Recent Labs    05/08/23 1402 05/12/23 2338 05/13/23 0647 05/13/23 0648 05/14/23 0402 07/13/23 0739 07/14/23 0217 07/17/23 1103 07/18/23 0757 08/14/23 1330 09/16/23 1534 09/24/23 1033 10/18/23 1406 11/20/23 1328 01/10/24 0046 01/10/24 0434 01/11/24 1310 01/12/24 1038 01/13/24 0442  HGB 10.1*   < > 8.5*  --    < > 8.8*   < >  --    < >  --   --    < > 9.7*   < > 8.0* 7.5* 7.3* 7.5* 7.2*  MCV 85.6   < > 90.4  --    < > 85.2   < >  --    < >  --   --    < > 88.0   < > 93.8 94.1 95.2 93.3 93.6  VITAMINB12 306  --   --  407  --  236  --  2,202*  --   --   --   --   --   --   --   --   --   --   --   FOLATE >  24.2  --   --  20.5  --   --   --  21.0  --   --   --   --   --   --   --   --   --   --   --   FERRITIN >1500.0*  --  1,430*  --   --   --   --  1,278*  --  684* 413.1*  --  420*  --   --   --   --   --   --   TIBC  --   --  129*  --   --   --   --  143*  --  228* 204.4*  --  238*  --   --   --   --   --   --   IRON   --   --  18*  --   --   --   --  26*  --  76 66  --  32*  --   --   --   --   --   --   RETICCTPCT 2.0  --   --  2.0  --   --   --  1.6  --   --   --   --   --   --   --   --   --   --   --    < > = values in this interval not displayed.    Chronic systolic CHF  nonischemic cardiomyopathy HTN Currently hemodynamically stable.  CHF remains compensated Continue carvedilol  6.25 mg daily.  Lasix  was stopped to prevent dehydration due to fluid loss from ostomy.  HLD Statin  H/o recurrent UTI  Urethral stricture, BPH S/p  chronic indwelling Foley catheter Continue Flomax  and finasteride   Dementia Bipolar disorder Insomnia Continue venlafaxine  150 mg daily, buspirone  10 mg 3 times daily, gabapentin  100 mg 3 times daily, Abilify  2 mg daily, trazodone  100 mg daily, melatonin nightly as needed  Obesity 2 Body mass index is 36.54 kg/m. Patient has been advised to make an attempt to improve diet and exercise patterns to aid in weight loss.  OSA CPAP nightly  Okay to discharge from medicine standpoint.    Mobility:  PT Orders:  Active  PT Follow up Rec:  Skilled Nursing-Short Term Rehab (<3 Hours/Day)01/13/2024 1236   Goals of care   Code Status: Full Code   DVT prophylaxis:  enoxaparin  (LOVENOX ) injection 40 mg Start: 01/11/24 0800 SCD's Start: 01/09/24 1428   Antimicrobials: Not on antibiotics currently Fluid: None Family Communication: None at bedside   Diet:  Diet Order             Diet regular Fluid consistency: Thin  Diet effective now           Diet - low sodium heart healthy                   Scheduled Meds:  acetaminophen   1,000 mg Oral Q6H   ARIPiprazole   2 mg Oral q AM   busPIRone   10 mg Oral TID   carvedilol   6.25 mg Oral BID   Chlorhexidine  Gluconate Cloth  6 each Topical Daily   enoxaparin  (LOVENOX ) injection  40 mg Subcutaneous Q24H   feeding supplement  237 mL Oral BID BM   ferrous sulfate   325 mg Oral BID WC   finasteride   5 mg Oral Daily   folic acid   1 mg Oral Daily   gabapentin   100 mg Oral TID   insulin  aspart  0-15 Units Subcutaneous TID WC   insulin  aspart  0-5 Units Subcutaneous QHS   loperamide   2 mg Oral BID   mirtazapine   30 mg Oral QHS   multivitamin with minerals  1 tablet Oral Daily   polycarbophil  625 mg Oral BID   pravastatin   20 mg Oral QHS   primidone   50 mg Oral BID   sodium chloride  flush  3 mL Intravenous Q12H   tamsulosin   0.4 mg Oral Daily   venlafaxine  XR  150 mg Oral Daily    PRN meds: sodium chloride , diphenhydrAMINE  **OR**  diphenhydrAMINE , hydrALAZINE , HYDROmorphone  (DILAUDID ) injection, loperamide , magic mouthwash, melatonin, menthol -cetylpyridinium, metoprolol  tartrate, ondansetron  **OR** ondansetron  (ZOFRAN ) IV, mouth rinse, phenol, simethicone , sodium chloride  flush, traMADol    Infusions:   sodium chloride       Antimicrobials: Anti-infectives (From admission, onward)    Start     Dose/Rate Route Frequency Ordered Stop   01/09/24 2200  cefoTEtan  (CEFOTAN ) 2 g in sodium chloride  0.9 % 100 mL IVPB        2 g 200 mL/hr over 30 Minutes Intravenous Every 12 hours 01/09/24 1427 01/09/24 2247   01/09/24 0715  cefoTEtan  (CEFOTAN ) 2 g in sodium chloride  0.9 % 100 mL IVPB        2 g 200 mL/hr over 30 Minutes Intravenous On call to O.R. 01/09/24 0700 01/09/24 1429       Objective: Vitals:   01/14/24 0624 01/14/24 1233  BP: 128/79 116/73  Pulse: (!) 59 95  Resp: 17 17  Temp: 98.5 F (36.9 C) 98.6 F (37 C)  SpO2: 97% 96%    Intake/Output Summary (Last 24 hours) at 01/14/2024 1437 Last data filed at 01/14/2024 1424 Gross per 24 hour  Intake 840 ml  Output 2115 ml  Net -1275 ml   Filed Weights   01/09/24 1428 01/10/24 0529 01/12/24 0500  Weight: 101.9 kg 103.3 kg 102.7 kg   Weight change:  Body mass index is 36.54 kg/m.   Physical Exam: General exam: Pleasant, elderly Caucasian male.  Not in distress Skin: No rashes, lesions or ulcers. HEENT: Atraumatic, normocephalic, no obvious bleeding Lungs: Clear to auscultation bilaterally,  CVS: S1, S2, no murmur,   GI/Abd: Soft, nontender, nondistended, bowel sound present, colostomy bag on right lower quadrant with greenish liquid output CNS: Alert, awake, oriented x 3 Psychiatry: Mood appropriate Extremities: No pedal edema, no calf tenderness,   Data Review: I have personally reviewed the laboratory data and studies available.  F/u labs  Unresulted Labs (From admission, onward)     Start     Ordered   01/16/24 0500  Creatinine, serum   (enoxaparin  (LOVENOX )  CrCl >/= 30 mL/min  )  Weekly,   R     Comments: while on enoxaparin  therapy.    01/09/24 1427   01/15/24 0500  CBC with Differential/Platelet  Tomorrow morning,   R       Question:  Specimen collection method  Answer:  Unit=Unit collect   01/14/24 0815   01/15/24 0500  Basic metabolic panel with GFR  Tomorrow morning,   R       Question:  Specimen collection method  Answer:  Unit=Unit collect   01/14/24 0815   01/15/24 0500  Type and screen  Once,   R        01/14/24 0815  Signed, Chapman Rota, MD [facility] 01/14/2024

## 2024-01-14 NOTE — TOC Transition Note (Signed)
 Transition of Care [facility]) - Discharge Note   Patient Details  Name: Calvin Escobar MRN: [id number] Date of Birth: May 27, 1951  Transition of Care [facility]) CM/SW Contact:  Alfonse JONELLE Rex, RN Phone Number: 01/14/2024, 2:12 PM   Clinical Narrative:  SNF auth approved. DC order and DC Summary in, confirmed w/Rhonda w/[facility] bed available today, RM 318. Call to patient's spouse to inform of discharge, no answer, vm left with NCM name and phone number for call back. [facility] for transport. No further TOC needs identified at this time.      Final next level of care: Skilled Nursing Facility [facility]) Barriers to Discharge: Barriers Resolved   Patient Goals and CMS Choice Patient states their goals for this hospitalization and ongoing recovery are:: return home after SNF CMS Medicare.gov Compare Post Acute Care list provided to:: Patient Represenative (must comment) (Fadness,[name] (Spouse)  [phone] (Mobile)) Choice offered to / list presented to : Spouse Emery ownership interest in [facility].provided to:: Spouse    Discharge Placement              Patient chooses bed at: [facility] Patient to be transferred to facility by: PT[facility] Name of family member notified: Deo, Mehringer (Spouse)  [phone] (Mobile) Patient and family notified of of transfer: 01/14/24  Discharge Plan and Services Additional resources added to the After Visit Summary for                                       Social Drivers of Health (SDOH) Interventions SDOH Screenings   Food Insecurity: No Food Insecurity (01/09/2024)  Housing: Low Risk  (01/09/2024)  Transportation Needs: No Transportation Needs (01/09/2024)  Utilities: Not At Risk (01/09/2024)  Alcohol Screen: Low Risk  (10/10/2023)  Depression (PHQ2-9): Low Risk  (12/09/2023)  Recent Concern: Depression (PHQ2-9) - Medium Risk (09/16/2023)  Financial Resource Strain: Low Risk  (10/10/2023)  Physical Activity:  Sufficiently Active (10/10/2023)  Social Connections: Moderately Integrated (01/09/2024)  Recent Concern: Social Connections - Socially Isolated (12/11/2023)  Stress: No Stress Concern Present (10/10/2023)  Tobacco Use: Low Risk  (01/09/2024)  Health Literacy: Adequate Health Literacy (10/10/2023)     Readmission Risk Interventions    01/14/2024    2:09 PM 07/04/2023    1:27 PM 07/03/2023    2:48 PM  Readmission Risk Prevention Plan  Transportation Screening  Complete Complete  PCP or Specialist Appt within 5-7 Days  Complete Complete  Home Care Screening  Complete Complete  Medication Review (RN CM)  Complete Complete  Medication Review (RN Care Manager) Complete    PCP or Specialist appointment within 3-5 days of discharge Complete    HRI or Home Care Consult Complete    SW Recovery Care/Counseling Consult Complete    Palliative Care Screening Not Applicable    Skilled Nursing Facility Complete

## 2024-01-14 NOTE — Progress Notes (Signed)
 Pt s/p robotic colectomy & takedown of colovesical fistula Path benign; c/w diverticulitis I told the pt the good news - copy of pathology report in room

## 2024-01-14 NOTE — Progress Notes (Signed)
 Occupational Therapy Treatment Patient Details Name: Calvin Escobar MRN: [id number] DOB: [birth year] Today's Date: 01/14/2024   History of present illness 73 yr old male admitted 01/09/24 with colovesical fistula, s/p rectosigmoid resection, ileostomy. Pt with recent admission to [facility] on 7/9-7/17/25 with tachycardia, tachypnea and cough. + COVID and ecoli bacteremia, Other recent admission 6/30-12/07/23 for hematuria--went home with foley catheter. PMH:  hyperlipidemia, systolic heart failure, history of colovesical fistula urinary retention anemia of chronic disease, bipolar disorder, dementia.   OT comments  The pt was motivated to participate in the session. He was seen for progression of ADL participation, ADL instruction, and functional strengthening. He required min assist for upper body dressing seated EOB, min assist to stand using a RW, and CGA for upper body grooming standing at the sink. He reported having some abdominal pain with activity, and he also appeared to be with mild to moderate shortness of breath with activity. Continue OT plan of care. Patient will benefit from continued inpatient follow up therapy, <3 hours/day.       If plan is discharge home, recommend the following:  Assistance with cooking/housework;Help with stairs or ramp for entrance;Assist for transportation;A little help with walking and/or transfers;A little help with bathing/dressing/bathroom   Equipment Recommendations  Other (comment) (to be determined, pending progress at next level of care)    Recommendations for Other Services      Precautions / Restrictions Precautions Precautions: Fall Restrictions Weight Bearing Restrictions Per Provider Order: No Other Position/Activity Restrictions: ileostomy       Mobility Bed Mobility Overal bed mobility: Needs Assistance Bed Mobility: Supine to Sit     Supine to sit: Min assist, Used rails, HOB elevated          Transfers Overall transfer level:  Needs assistance Equipment used: Rolling walker (2 wheels) Transfers: Sit to/from Stand Sit to Stand: Min assist, From elevated surface                 Balance     Sitting balance-Leahy Scale: Good         Standing balance comment: CGA with RW           ADL either performed or assessed with clinical judgement   ADL Overall ADL's : Needs assistance/impaired Eating/Feeding: Set up;Sitting Eating/Feeding Details (indicate cue type and reason): He drank from a cup while seated in the chair. Grooming: Contact guard assist;Standing Grooming Details (indicate cue type and reason): He required intermittent CGA for performing face washing and hand washing in standing at the sink. OT verbally cued him to step closer to the sink when performing tasks for added safety, as well as to keep the walker closer to him.         Upper Body Dressing : Minimal assistance;Sitting Upper Body Dressing Details (indicate cue type and reason): He doffed a hospital gown with SBA, then required min assist to donn another clean one while seated EOB.                                  Communication Communication Communication: No apparent difficulties   Cognition Arousal: Alert Behavior During Therapy: WFL for tasks assessed/performed Cognition: History of cognitive impairments (His medical chart indicates a history of dementia.)        Following commands: Intact                      Pertinent  Vitals/ Pain       Pain Assessment Pain Assessment: Faces Pain Score: 3  Pain Location: abdominal with activity Pain Intervention(s): Limited activity within patient's tolerance, Repositioned, Monitored during session   Frequency  Min 2X/week        Progress Toward Goals  OT Goals(current goals can now be found in the care plan section)  Progress towards OT goals: Progressing toward goals  Acute Rehab OT Goals Patient Stated Goal: he desires to go to short-term SNF  rehab before he returns home OT Goal Formulation: With patient Time For Goal Achievement: 01/24/24  Plan         AM-PAC OT 6 Clicks Daily Activity     Outcome Measure   Help from another person eating meals?: None Help from another person taking care of personal grooming?: A Little Help from another person toileting, which includes using toliet, bedpan, or urinal?: A Little Help from another person bathing (including washing, rinsing, drying)?: A Lot Help from another person to put on and taking off regular upper body clothing?: A Little Help from another person to put on and taking off regular lower body clothing?: A Little 6 Click Score: 18    End of Session Equipment Utilized During Treatment: Rolling walker (2 wheels);Gait belt  OT Visit Diagnosis: Muscle weakness (generalized) (M62.81);Other abnormalities of gait and mobility (R26.89);Pain Pain - part of body:  (abdomen)   Activity Tolerance Patient tolerated treatment well   Patient Left in chair;with call bell/phone within reach;with chair alarm set   Nurse Communication Mobility status        Time: 8984-8963 OT Time Calculation (min): 21 min  Charges: OT General Charges $OT Visit: 1 Visit OT Treatments $Self Care/Home Management : 8-22 mins     Delanna LITTIE Molt, OTR/L 01/14/2024, 12:45 PM

## 2024-01-14 NOTE — Consult Note (Signed)
 WOC Nurse ostomy follow up Stoma type/location: RLQ, loop ileostomy  Stomal assessment/size: 1 1/2 budded, pink, moist, functional os at skin level at 6 o'clock  Peristomal assessment: intact; mild mucocutaneous  separation from 2-3 o'clock  Treatment options for stomal/peristomal skin: none Output gelatinous green Ostomy pouching: 2pc. Soft convex 2 1/4  Education provided:  Met with patient per his request WOC nursing has attempted to contact wife several times, I left contact phone number yesterday to arrange teaching visit with he and the patient.  I reviewed all aspects of pouch change with patient, he becomes a little more anxious asking about his wife and arranging a time with her.  I let him know about our attempts to reach her.  He has SNF bed offers today, and DC orders. He is aware and wants to go to rehab.  I suggest he and his wife work with SNF staff on ostomy care as well.  I have taken him educational materials and reviewed pictures of pouch change  Enrolled patient in [place] [employer] Discharge program: Yes   FOR SNF [employer] items # 88296 soft convex 2 1/4 (using convex because functional os is at skin level  [employer] item 18133 2 1/4 pouch [employer] item 8805 barrier ring if needed.   WOC Nurse will follow along with you for continued support with ostomy teaching and care Merrisa Skorupski [facility] MSN, RN, [place], CNS, [employer] [phone]

## 2024-01-14 NOTE — Discharge Summary (Addendum)
 Physician Discharge Summary    Calvin Escobar MRN: [id number] DOB/AGE: 75-06-1950 = 75 y.o.  Patient Care Team: [name], MD as PCP - General (Internal Medicine) [name], MD as PCP - Cardiology (Cardiology) [name], MD (Dermatology) [facility] (Behavioral Health) Ezzard Rolin BIRCH, LCSW as VBCI Care Management (Licensed Clinical Social Worker) [name] (Psychiatry) [name], [name] LABOR, MD (Psychiatry) Legrand Victory [name], MD as Consulting Physician (Gastroenterology) Sheldon Standing, MD as Consulting Physician (Colon and Rectal Surgery) Ezzard Rolin BIRCH KEN (Licensed Clinical Social Worker) Carolee Sherwood BIRCH DOUGLAS, MD as Consulting Physician (Urology) Daneen Damien BROCKS, NP as Nurse Practitioner (Cardiology)  Admit date: 01/09/2024  Discharge date: 01/14/2024  Hospital Stay = 5 days    Discharge Diagnoses:  Principal Problem:   Colovesical fistula Active Problems:   Ileostomy in place [facility])   Bipolar affective disorder, depressed (HCC)   Dementia (HCC)   Chronic HFrEF (heart failure with reduced ejection fraction) (HCC)   Chronic bronchitis (HCC)   HLD (hyperlipidemia)   OSA treated with BiPAP   BPH (benign prostatic hyperplasia)   Primary insomnia   Hypertension   Chronic kidney disease, stage 3a (HCC)   Stricture of male urethra   Stricture of sigmoid colon (HCC)   History of COVID-19   History of urinary retention   Chronic indwelling Foley catheter   Malnutrition of moderate degree   OSA on CPAP   Diverticulitis of sigmoid colon s/p robotic colectomy 01/09/2024   5 Days Post-Op  01/09/2024  POST-OPERATIVE DIAGNOSIS:   COLOVESICAL FISTULA WITH ABSCESS RECTOSIGMOID STRICTURE   PROCEDURE:   -ROBOTIC LOW ANTERIOR RECTOSIGMOID RESECTION (LAR) WITH ANASTOMOSIS -COLOVESICAL FISTULA TAKEDOWN & REPAIR -DIVERTING LOOP ILEOSTOMY (DLI),  =DRAINAGE OF PELVIC ABSCESS -LYSIS OF ADHESIONS x45 MINUTES (30% OF  CASE) -INTRAOPERATIVE ASSESSMENT OF TISSUE VASCULAR PERFUSION USING ICG (indocyanine green ) IMMUNOFLUORESCENCE -TRANSVERSUS ABDOMINIS PLANE (TAP) BLOCK - BILATERAL -FLEXIBLE SIGMOIDOSCOPY   SURGEON:  [name], MD   OR FINDINGS:   Patient had very thickened rectosigmoid colon folded and twisted upon itself with stricturing.  Extremely dense adhesions to the left dome of the bladder with 4 x 3 x 3 cm abscess.  Abscess decompressed.  No active persistent opening of the bladder on insufflation.   No obvious metastatic disease on visceral parietal peritoneum or liver.   It is a 31mm EEA anastomosis ( distal descending colon  connected to proximal rectum.)  It rests 17 cm from the anal verge by flexible sigmoidoscopy  Consults: Case Management / Social Work, Physical Therapy, Occupational Therapy, Pharmacy, Nutrition, Wound ostomy consult nurse (WOCN), Anesthesia, and Urology  Hospital Course:   The patient underwent the surgery above.  Postoperatively, the patient gradually mobilized and advanced to a solid diet.  Pain and other symptoms were treated aggressively.  Diverting loop ileostomy bowel function returned.  Effluent controlled with combination of FiberCon, ferrous sulfate , loperamide  twice daily.  Avoid dehydration.  Home Lasix  diuretics held.  Internal medicine consultation made.  Therapy consultations made.  Patient with chronic anemia due to chronic irritation and bleeding from cystitis and diverticulitis.  He received IV iron  doses x 2 and going home on oral iron .  No evidence of any drop on low-dose anticoagulation.  By the time of discharge, the patient was walking with some assistance with therapies,  eating food, having flatus.  Pain was well-controlled on an oral medications.  Based on meeting discharge criteria and continuing to recover, I felt it was  safe for the patient to be discharged from the hospital to further recover with close followup. Postoperative recommendations  were discussed in detail.  They are written as well.  Discharged Condition: stable  Discharge Exam: Blood pressure 128/79, pulse (!) 59, temperature 98.5 F (36.9 C), temperature source Oral, resp. rate 17, height 5' 6 (1.676 m), weight 102.7 kg, SpO2 97%.  General: Pt awake/alert/oriented x4 in No acute distress.  Bright and alert. Eyes: PERRL, normal EOM.  Sclera clear.  No icterus Neuro: CN II-XII intact w/o focal sensory/motor deficits. Lymph: No head/neck/groin lymphadenopathy Psych:  No delerium/psychosis/paranoia HENT: Normocephalic, Mucus membranes moist.  No thrush Neck: Supple, No tracheal deviation Chest:  No chest wall pain w good excursion CV:  Pulses intact.  Regular rhythm MS: Normal AROM mjr joints.  No obvious deformity  Abdomen: Soft.  Nondistended.  Nontender.  No evidence of peritonitis.  No incarcerated hernias.  Right lower quadrant Blake surgical drain serosanguineous (I ordered to remove today, day of discharge).  Diverting loop ileostomy right lower quadrant pink with oatmeal consistency effluent in bag.  Ext:  SCDs BLE.  No mjr edema.  No cyanosis Skin: No petechiae / purpura   Disposition:    Follow-up Information     Sheldon Standing, MD Follow up in 3 week(s).   Specialties: General Surgery, Colon and Rectal Surgery Why: To follow up after your operation Contact information: [address] [place] [address] [phone]         Carolee Sherwood BIRCH III, MD Follow up in 1 month(s).   Specialty: Urology Why: to have your bladder tested & see if the Foley cathter can be removed Contact information: [address] [place] [address] [phone]                 Discharge disposition: 03-Skilled Nursing Facility       Discharge Instructions     Amb Referral to Ostomy Clinic   Complete by: As directed    Reason for referral modifiers:  Pre and post-operative counseling for ostomy management Consultation for  problems such as pouch leaking or skin irritation     Call MD for:   Complete by: As directed    FEVER > 101.5 F  (temperatures < 101.5 F are not significant)   Call MD for:  extreme fatigue   Complete by: As directed    Call MD for:  persistant dizziness or light-headedness   Complete by: As directed    Call MD for:  persistant nausea and vomiting   Complete by: As directed    Call MD for:  redness, tenderness, or signs of infection (pain, swelling, redness, odor or green/yellow discharge around incision site)   Complete by: As directed    Call MD for:  severe uncontrolled pain   Complete by: As directed    Diet - low sodium heart healthy   Complete by: As directed    Start with a bland diet such as soups, liquids, starchy foods, low fat foods, etc. the first few days at home. Gradually advance to a solid, low-fat, high fiber diet by the end of the first week at home.   Add a fiber supplement to your diet (Metamucil, etc) If you feel full, bloated, or constipated, stay on a full liquid or pureed/blenderized diet for a few days until you feel better and are no longer constipated.   Discharge instructions   Complete by: As directed    See Discharge  Instructions If you are not getting better after two weeks or are noticing you are getting worse, contact our office [phone] for further advice.  We may need to adjust your medications, re-evaluate you in the office, send you to the emergency room, or see what other things we can do to help. The clinic staff is available to answer your questions during regular business hours (8:30am-5pm).  Please don't hesitate to call and ask to speak to one of our nurses for clinical concerns.    A surgeon from [facility] is always on call at the hospitals 24 hours/day If you have a medical emergency, go to the nearest emergency room or call 911.   Discharge wound care:   Complete by: As directed    It is good for closed incisions and  even open wounds to be washed every day.  Shower every day.  Short baths are fine.  Wash the incisions and wounds clean with soap & water .    You may leave closed incisions open to air if it is dry.   You may cover the incision with clean gauze & replace it after your daily shower for comfort.  SEE OSTOMY CARE INSTRUCTIONS  TEGADERM:  You have clear gauze band-aid dressings over your closed incision(s).  Remove the dressings 2 days after surgery.   Do Not Remove Foley   Complete by: As directed    H/o urinary retention - keep foley until seen by [facility] in mid Sept 2025   Driving Restrictions   Complete by: As directed    You may drive when: - you are no longer taking narcotic prescription pain medication - you can comfortably wear a seatbelt - you can safely make sudden turns/stops without pain.   Increase activity slowly   Complete by: As directed    Start light daily activities --- self-care, walking, climbing stairs- beginning the day after surgery.  Gradually increase activities as tolerated.  Control your pain to be active.  Stop when you are tired.  Ideally, walk several times a day, eventually an hour a day.   Most people are back to most day-to-day activities in a few weeks.  It takes 4-6 weeks to get back to unrestricted, intense activity. If you can walk 30 minutes without difficulty, it is safe to try more intense activity such as jogging, treadmill, bicycling, low-impact aerobics, swimming, etc. Save the most intensive and strenuous activity for last (Usually 4-8 weeks after surgery) such as sit-ups, heavy lifting, contact sports, etc.  Refrain from any intense heavy lifting or straining until you are off narcotics for pain control.  You will have off days, but things should improve week-by-week. DO NOT PUSH THROUGH PAIN.  Let pain be your guide: If it hurts to do something, don't do it.   Lifting restrictions   Complete by: As directed    If you can walk 30 minutes  without difficulty, it is safe to try more intense activity such as jogging, treadmill, bicycling, low-impact aerobics, swimming, etc. Save the most intensive and strenuous activity for last (Usually 4-8 weeks after surgery) such as sit-ups, heavy lifting, contact sports, etc.   Refrain from any intense heavy lifting or straining until you are off narcotics for pain control.  You will have off days, but things should improve week-by-week. DO NOT PUSH THROUGH PAIN.  Let pain be your guide: If it hurts to do something, don't do it.  Pain is your body warning you to  avoid that activity for another week until the pain goes down.   May shower / Bathe   Complete by: As directed    May walk up steps   Complete by: As directed    Remove dressing in 48 hours   Complete by: As directed    Make sure all dressings have been removed on the second day after surgery = 8/9 Saturday Leave incisions open to air.  OK to cover incisions with gauze or bandages as desired   Sexual Activity Restrictions   Complete by: As directed    You may have sexual intercourse when it is comfortable. If it hurts to do something, stop.       Allergies as of 01/14/2024       Reactions   Albuterol  Palpitations   Supraventricular Tachycardia         Medication List     STOP taking these medications    docusate sodium  100 MG capsule Commonly known as: COLACE   furosemide  40 MG tablet Commonly known as: Lasix    metroNIDAZOLE  500 MG tablet Commonly known as: FLAGYL        TAKE these medications    ARIPiprazole  2 MG tablet Commonly known as: ABILIFY  Take 1 tablet (2 mg total) by mouth in the morning.   busPIRone  10 MG tablet Commonly known as: BUSPAR  Take 1 tablet (10 mg total) by mouth 3 (three) times daily.   carvedilol  6.25 MG tablet Commonly known as: COREG  Take 1 tablet (6.25 mg total) by mouth 2 (two) times daily.   ferrous sulfate  325 (65 FE) MG tablet Take 1 tablet (325 mg total) by mouth 2  (two) times daily with a meal.   finasteride  5 MG tablet Commonly known as: PROSCAR  Take 5 mg by mouth daily.   folic acid  1 MG tablet Commonly known as: FOLVITE  Take 1 mg by mouth daily.   gabapentin  300 MG capsule Commonly known as: NEURONTIN  Take 300 mg by mouth at bedtime. What changed: Another medication with the same name was removed. Continue taking this medication, and follow the directions you see here.   HYDROcodone -acetaminophen  5-325 MG tablet Commonly known as: NORCO/VICODIN Take 1-2 tablets by mouth every 6 (six) hours as needed for moderate pain (pain score 4-6) or severe pain (pain score 7-10).   loperamide  2 MG capsule Commonly known as: IMODIUM  Take 1 capsule (2 mg total) by mouth 2 (two) times daily.   loperamide  2 MG capsule Commonly known as: IMODIUM  Take 1-2 capsules (2-4 mg total) by mouth every 6 (six) hours as needed for diarrhea or loose stools (Use if >2 BM every 8 hours).   melatonin 3 MG Tabs tablet Take 1 tablet (3 mg total) by mouth at bedtime as needed (insomnia).   mirtazapine  30 MG tablet Commonly known as: REMERON  Take 1 tablet (30 mg total) by mouth at bedtime.   ondansetron  4 MG tablet Commonly known as: ZOFRAN  Take 4 mg by mouth every 8 (eight) hours as needed for nausea.   polycarbophil 625 MG tablet Commonly known as: FIBERCON Take 1 tablet (625 mg total) by mouth 2 (two) times daily.   pravastatin  20 MG tablet Commonly known as: PRAVACHOL  Take 1 tablet (20 mg total) by mouth at bedtime.   primidone  50 MG tablet Commonly known as: MYSOLINE  Take 1 tablet (50 mg total) by mouth 2 (two) times daily.   tamsulosin  0.4 MG Caps capsule Commonly known as: FLOMAX  TAKE 1 CAPSULE BY MOUTH EVERY DAY   traZODone   100 MG tablet Commonly known as: DESYREL  Take 1 tablet (100 mg total) by mouth at bedtime.   venlafaxine  XR 150 MG 24 hr capsule Commonly known as: EFFEXOR -XR Take 150 mg by mouth daily.   venlafaxine  XR 75 MG 24 hr  capsule Commonly known as: EFFEXOR -XR Take 75 mg by mouth daily.               Discharge Care Instructions  (From admission, onward)           Start     Ordered   01/09/24 0000  Discharge wound care:       Comments: It is good for closed incisions and even open wounds to be washed every day.  Shower every day.  Short baths are fine.  Wash the incisions and wounds clean with soap & water .    You may leave closed incisions open to air if it is dry.   You may cover the incision with clean gauze & replace it after your daily shower for comfort.  SEE OSTOMY CARE INSTRUCTIONS  TEGADERM:  You have clear gauze band-aid dressings over your closed incision(s).  Remove the dressings 2 days after surgery.   01/09/24 1246            Significant Diagnostic Studies:  Results for orders placed or performed during the hospital encounter of 01/09/24 (from the past 72 hours)  Glucose, capillary     Status: Abnormal   Collection Time: 01/11/24 11:58 AM  Result Value Ref Range   Glucose-Capillary 142 (H) 70 - 99 mg/dL    Comment: Glucose reference range applies only to samples taken after fasting for at least 8 hours.  CBC     Status: Abnormal   Collection Time: 01/11/24  1:10 PM  Result Value Ref Range   WBC 9.5 4.0 - 10.5 K/uL   RBC 2.69 (L) 4.22 - 5.81 MIL/uL   Hemoglobin 7.3 (L) 13.0 - 17.0 g/dL   HCT 74.3 (L) 60.9 - 47.9 %   MCV 95.2 80.0 - 100.0 fL   MCH 27.1 26.0 - 34.0 pg   MCHC 28.5 (L) 30.0 - 36.0 g/dL   RDW 85.1 88.4 - 84.4 %   Platelets 167 150 - 400 K/uL   nRBC 0.0 0.0 - 0.2 %    Comment: Performed at [facility], [address], [place], [place] [address]  Creatinine, serum     Status: Abnormal   Collection Time: 01/11/24  1:10 PM  Result Value Ref Range   Creatinine, Ser 1.51 (H) 0.61 - 1.24 mg/dL   GFR, Estimated 48 (L) >60 mL/min    Comment: (NOTE) Calculated using the CKD-EPI Creatinine Equation (2021) Performed at [facility], [address], [place], [place] [address]   Potassium     Status: None   Collection Time: 01/11/24  1:10 PM  Result Value Ref Range   Potassium 4.3 3.5 - 5.1 mmol/L    Comment: Performed at [facility], [address] [place], [place] [address]  Glucose, capillary     Status: Abnormal   Collection Time: 01/11/24  5:07 PM  Result Value Ref Range   Glucose-Capillary 143 (H) 70 - 99 mg/dL    Comment: Glucose reference range applies only to samples taken after fasting for at least 8 hours.  Glucose, capillary     Status: Abnormal   Collection Time: 01/11/24  9:53 PM  Result Value Ref Range   Glucose-Capillary 121 (H)  70 - 99 mg/dL    Comment: Glucose reference range applies only to samples taken after fasting for at least 8 hours.  Glucose, capillary     Status: None   Collection Time: 01/12/24  1:02 AM  Result Value Ref Range   Glucose-Capillary 99 70 - 99 mg/dL    Comment: Glucose reference range applies only to samples taken after fasting for at least 8 hours.  Glucose, capillary     Status: None   Collection Time: 01/12/24  7:52 AM  Result Value Ref Range   Glucose-Capillary 96 70 - 99 mg/dL    Comment: Glucose reference range applies only to samples taken after fasting for at least 8 hours.  CBC     Status: Abnormal   Collection Time: 01/12/24 10:38 AM  Result Value Ref Range   WBC 8.1 4.0 - 10.5 K/uL   RBC 2.70 (L) 4.22 - 5.81 MIL/uL   Hemoglobin 7.5 (L) 13.0 - 17.0 g/dL   HCT 74.7 (L) 60.9 - 47.9 %   MCV 93.3 80.0 - 100.0 fL   MCH 27.8 26.0 - 34.0 pg   MCHC 29.8 (L) 30.0 - 36.0 g/dL   RDW 85.1 88.4 - 84.4 %   Platelets 195 150 - 400 K/uL   nRBC 0.0 0.0 - 0.2 %    Comment: Performed at [facility], [address] [place], [place] [address]  Creatinine, serum     Status: Abnormal   Collection Time: 01/12/24 10:38 AM  Result Value Ref Range   Creatinine, Ser 1.27 (H) 0.61 - 1.24 mg/dL   GFR, Estimated 59 (L) >60  mL/min    Comment: (NOTE) Calculated using the CKD-EPI Creatinine Equation (2021) Performed at [facility], [address] [place], [place] [address]   Potassium     Status: None   Collection Time: 01/12/24 10:38 AM  Result Value Ref Range   Potassium 4.2 3.5 - 5.1 mmol/L    Comment: Performed at [name], [address], [place], [place] [address]  Glucose, capillary     Status: Abnormal   Collection Time: 01/12/24 11:49 AM  Result Value Ref Range   Glucose-Capillary 105 (H) 70 - 99 mg/dL    Comment: Glucose reference range applies only to samples taken after fasting for at least 8 hours.  Glucose, capillary     Status: Abnormal   Collection Time: 01/12/24  4:30 PM  Result Value Ref Range   Glucose-Capillary 125 (H) 70 - 99 mg/dL    Comment: Glucose reference range applies only to samples taken after fasting for at least 8 hours.  Glucose, capillary     Status: Abnormal   Collection Time: 01/12/24  9:35 PM  Result Value Ref Range   Glucose-Capillary 120 (H) 70 - 99 mg/dL    Comment: Glucose reference range applies only to samples taken after fasting for at least 8 hours.  CBC     Status: Abnormal   Collection Time: 01/13/24  4:42 AM  Result Value Ref Range   WBC 8.8 4.0 - 10.5 K/uL   RBC 2.66 (L) 4.22 - 5.81 MIL/uL   Hemoglobin 7.2 (L) 13.0 - 17.0 g/dL   HCT 75.0 (L) 60.9 - 47.9 %   MCV 93.6 80.0 - 100.0 fL   MCH 27.1 26.0 - 34.0 pg   MCHC 28.9 (L) 30.0 - 36.0 g/dL   RDW 85.0 88.4 - 84.4 %   Platelets 250 150 - 400 K/uL  nRBC 0.0 0.0 - 0.2 %    Comment: Performed at [facility], [address], [place], [place] [address]  Creatinine, serum     Status: Abnormal   Collection Time: 01/13/24  4:42 AM  Result Value Ref Range   Creatinine, Ser 1.44 (H) 0.61 - 1.24 mg/dL   GFR, Estimated 51 (L) >60 mL/min    Comment: (NOTE) Calculated using the CKD-EPI Creatinine Equation (2021) Performed at [facility], [address], [place], [place] [address]   Potassium     Status: None   Collection Time: 01/13/24  4:42 AM  Result Value Ref Range   Potassium 4.1 3.5 - 5.1 mmol/L    Comment: Performed at [facility], [address], [place], [place] [address]  Glucose, capillary     Status: Abnormal   Collection Time: 01/13/24  7:45 AM  Result Value Ref Range   Glucose-Capillary 107 (H) 70 - 99 mg/dL    Comment: Glucose reference range applies only to samples taken after fasting for at least 8 hours.  Glucose, capillary     Status: Abnormal   Collection Time: 01/13/24 11:49 AM  Result Value Ref Range   Glucose-Capillary 133 (H) 70 - 99 mg/dL    Comment: Glucose reference range applies only to samples taken after fasting for at least 8 hours.  Glucose, capillary     Status: Abnormal   Collection Time: 01/13/24  5:01 PM  Result Value Ref Range   Glucose-Capillary 178 (H) 70 - 99 mg/dL    Comment: Glucose reference range applies only to samples taken after fasting for at least 8 hours.  Glucose, capillary     Status: Abnormal   Collection Time: 01/13/24 10:03 PM  Result Value Ref Range   Glucose-Capillary 109 (H) 70 - 99 mg/dL    Comment: Glucose reference range applies only to samples taken after fasting for at least 8 hours.  Glucose, capillary     Status: None   Collection Time: 01/14/24  7:28 AM  Result Value Ref Range   Glucose-Capillary 98 70 - 99 mg/dL    Comment: Glucose reference range applies only to samples taken after fasting for at least 8 hours.    DG Chest Port 1 View Result Date: 01/09/2024 CLINICAL DATA:  Short of breath EXAM: PORTABLE CHEST 1 VIEW COMPARISON:  12/14/2023 FINDINGS: Single frontal view of the chest demonstrates right internal jugular catheter tip overlying superior vena cava. Stable cardiac silhouette. No airspace disease, effusion, or pneumothorax. No acute bony abnormalities. IMPRESSION: 1. No acute intrathoracic process.  2. Right internal jugular catheter as above. Electronically Signed   By: Ozell Daring M.D.   On: 01/09/2024 18:51    Past Medical History:  Diagnosis Date   Acute cystitis 05/13/2023   Acute hyponatremia 05/13/2023   Acute kidney injury superimposed on chronic kidney disease (HCC) 05/13/2023   CLINICAL CONTEXT: 73 year old male with stage 3 CKD, colovesical fistula, recurrent UTIs, and recent candidal UTI. Recent renal ultrasound (09/28/2023) confirmed mild left hydronephrosis and prostatic mass protruding into bladder base. Iron  studies consistent with anemia of chronic disease (iron  32, TIBC 238, ferritin 420).   DIAGNOSTIC ASSESSMENT: ? 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 History of GI bleeding (07/2023). Currently on ferrous sulfate  supplementation.   DIAGNOSTIC ASSESSMENT: ? Primary etiology (70-80% probability): Anemia of chronic disease/inflammation ? 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  EF 35 to 40% on echo 04/2021   Dementia (HCC)    Depression    Diarrhea 05/13/2023   E coli bacteremia 05/14/2023   High anion gap metabolic  acidosis 07/13/2023   History of Clostridioides difficile colitis 08/15/2023   Associated with colovesical fistula? Recurrent antibiotic(s) requirement     Hyperkalemia    Associated with losartan   Advised patient low potassium diet 08/2022             Lab Results      Component    Value    Date/Time           K    4.0    12/27/2023 01:20 PM           K    4.4    12/18/2023 05:07 AM           K    4.4    12/17/2023 06:01 AM           K    4.7    12/16/2023 06:15 AM           K    4.9    12/15/2023 08:21 AM           K    4.4    12/14/2023 06:00 AM           K    5.0    Hyperlipidemia    Hypertension    Leukocytosis 12/11/2023   Lab Results      Component    Value    Date/Time           WBC    6.2    12/27/2023 01:20 PM           WBC    11.6 (H)    12/18/2023 05:07 AM           WBC    9.6    12/17/2023 06:01 AM           WBC    9.4    12/16/2023 06:15 AM           WBC    12.7 (H)    12/15/2023 08:21 AM           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  Work-up with cardiology in January 2021.     catheterization    Assessment:     1. Normal coronaries  2. NICM with EF 25-30%  3. Normal hemodynamics  .     Pneumonia    PUD (peptic ulcer disease) 01/12/2015   Formatting of this note might be different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

## 2024-01-14 NOTE — Plan of Care (Signed)
  Problem: Education: Goal: Understanding of discharge needs will improve Outcome: Progressing   Problem: Activity: Goal: Ability to tolerate increased activity will improve Outcome: Progressing   Problem: Bowel/Gastric: Goal: Gastrointestinal status for postoperative course will improve Outcome: Progressing

## 2024-01-15 DIAGNOSIS — E871 Hypo-osmolality and hyponatremia: Secondary | ICD-10-CM | POA: Diagnosis not present

## 2024-01-15 DIAGNOSIS — N400 Enlarged prostate without lower urinary tract symptoms: Secondary | ICD-10-CM | POA: Diagnosis not present

## 2024-01-15 DIAGNOSIS — T839XXD Unspecified complication of genitourinary prosthetic device, implant and graft, subsequent encounter: Secondary | ICD-10-CM | POA: Diagnosis not present

## 2024-01-15 DIAGNOSIS — E8729 Other acidosis: Secondary | ICD-10-CM | POA: Diagnosis not present

## 2024-01-15 DIAGNOSIS — N390 Urinary tract infection, site not specified: Secondary | ICD-10-CM | POA: Diagnosis not present

## 2024-01-15 DIAGNOSIS — D638 Anemia in other chronic diseases classified elsewhere: Secondary | ICD-10-CM | POA: Diagnosis not present

## 2024-01-15 DIAGNOSIS — N3000 Acute cystitis without hematuria: Secondary | ICD-10-CM | POA: Diagnosis not present

## 2024-01-15 DIAGNOSIS — K219 Gastro-esophageal reflux disease without esophagitis: Secondary | ICD-10-CM | POA: Diagnosis not present

## 2024-01-15 DIAGNOSIS — R6520 Severe sepsis without septic shock: Secondary | ICD-10-CM | POA: Diagnosis not present

## 2024-01-16 DIAGNOSIS — E871 Hypo-osmolality and hyponatremia: Secondary | ICD-10-CM | POA: Diagnosis not present

## 2024-01-16 DIAGNOSIS — E8729 Other acidosis: Secondary | ICD-10-CM | POA: Diagnosis not present

## 2024-01-16 DIAGNOSIS — N400 Enlarged prostate without lower urinary tract symptoms: Secondary | ICD-10-CM | POA: Diagnosis not present

## 2024-01-16 DIAGNOSIS — N390 Urinary tract infection, site not specified: Secondary | ICD-10-CM | POA: Diagnosis not present

## 2024-01-16 DIAGNOSIS — R6520 Severe sepsis without septic shock: Secondary | ICD-10-CM | POA: Diagnosis not present

## 2024-01-16 DIAGNOSIS — A419 Sepsis, unspecified organism: Secondary | ICD-10-CM | POA: Diagnosis not present

## 2024-01-16 DIAGNOSIS — T839XXD Unspecified complication of genitourinary prosthetic device, implant and graft, subsequent encounter: Secondary | ICD-10-CM | POA: Diagnosis not present

## 2024-01-16 DIAGNOSIS — D638 Anemia in other chronic diseases classified elsewhere: Secondary | ICD-10-CM | POA: Diagnosis not present

## 2024-01-16 DIAGNOSIS — E785 Hyperlipidemia, unspecified: Secondary | ICD-10-CM | POA: Diagnosis not present

## 2024-01-16 DIAGNOSIS — K219 Gastro-esophageal reflux disease without esophagitis: Secondary | ICD-10-CM | POA: Diagnosis not present

## 2024-01-16 DIAGNOSIS — N3000 Acute cystitis without hematuria: Secondary | ICD-10-CM | POA: Diagnosis not present

## 2024-01-17 DIAGNOSIS — R6520 Severe sepsis without septic shock: Secondary | ICD-10-CM | POA: Diagnosis not present

## 2024-01-17 DIAGNOSIS — T839XXD Unspecified complication of genitourinary prosthetic device, implant and graft, subsequent encounter: Secondary | ICD-10-CM | POA: Diagnosis not present

## 2024-01-17 DIAGNOSIS — K219 Gastro-esophageal reflux disease without esophagitis: Secondary | ICD-10-CM | POA: Diagnosis not present

## 2024-01-17 DIAGNOSIS — D638 Anemia in other chronic diseases classified elsewhere: Secondary | ICD-10-CM | POA: Diagnosis not present

## 2024-01-17 DIAGNOSIS — E871 Hypo-osmolality and hyponatremia: Secondary | ICD-10-CM | POA: Diagnosis not present

## 2024-01-17 DIAGNOSIS — E8729 Other acidosis: Secondary | ICD-10-CM | POA: Diagnosis not present

## 2024-01-17 DIAGNOSIS — N390 Urinary tract infection, site not specified: Secondary | ICD-10-CM | POA: Diagnosis not present

## 2024-01-17 DIAGNOSIS — N3000 Acute cystitis without hematuria: Secondary | ICD-10-CM | POA: Diagnosis not present

## 2024-01-17 DIAGNOSIS — N400 Enlarged prostate without lower urinary tract symptoms: Secondary | ICD-10-CM | POA: Diagnosis not present

## 2024-01-20 DIAGNOSIS — E871 Hypo-osmolality and hyponatremia: Secondary | ICD-10-CM | POA: Diagnosis not present

## 2024-01-20 DIAGNOSIS — K219 Gastro-esophageal reflux disease without esophagitis: Secondary | ICD-10-CM | POA: Diagnosis not present

## 2024-01-20 DIAGNOSIS — R6520 Severe sepsis without septic shock: Secondary | ICD-10-CM | POA: Diagnosis not present

## 2024-01-20 DIAGNOSIS — N3000 Acute cystitis without hematuria: Secondary | ICD-10-CM | POA: Diagnosis not present

## 2024-01-20 DIAGNOSIS — E8729 Other acidosis: Secondary | ICD-10-CM | POA: Diagnosis not present

## 2024-01-20 DIAGNOSIS — N390 Urinary tract infection, site not specified: Secondary | ICD-10-CM | POA: Diagnosis not present

## 2024-01-20 DIAGNOSIS — N400 Enlarged prostate without lower urinary tract symptoms: Secondary | ICD-10-CM | POA: Diagnosis not present

## 2024-01-20 DIAGNOSIS — D638 Anemia in other chronic diseases classified elsewhere: Secondary | ICD-10-CM | POA: Diagnosis not present

## 2024-01-20 DIAGNOSIS — T839XXD Unspecified complication of genitourinary prosthetic device, implant and graft, subsequent encounter: Secondary | ICD-10-CM | POA: Diagnosis not present

## 2024-01-21 ENCOUNTER — Telehealth: Payer: Self-pay

## 2024-01-21 NOTE — Telephone Encounter (Signed)
 Copied from CRM [id number]. Topic: General - Other >> Jan 21, 2024  3:31 PM Chiquita SQUIBB wrote: Reason for CRM: Patients wife is calling in to let Dr. Jesus know that the patient went into [facility] about a week and a half ago. Patient had a surgery on his kidneys, colon, and bladder now in a rehab facility, she stated on Saturday he was kinda out of it and on Sunday he was having some issues, and has not gotten out of bed, and the patient is not eating much. She also stated all his medications has been stopped for now while he was having all of this done. She just wants Dr. Jesus to stay up to date.  Just for review on what is going on with pt

## 2024-01-22 DIAGNOSIS — N3000 Acute cystitis without hematuria: Secondary | ICD-10-CM | POA: Diagnosis not present

## 2024-01-22 DIAGNOSIS — K219 Gastro-esophageal reflux disease without esophagitis: Secondary | ICD-10-CM | POA: Diagnosis not present

## 2024-01-22 DIAGNOSIS — E871 Hypo-osmolality and hyponatremia: Secondary | ICD-10-CM | POA: Diagnosis not present

## 2024-01-22 DIAGNOSIS — N390 Urinary tract infection, site not specified: Secondary | ICD-10-CM | POA: Diagnosis not present

## 2024-01-22 DIAGNOSIS — N400 Enlarged prostate without lower urinary tract symptoms: Secondary | ICD-10-CM | POA: Diagnosis not present

## 2024-01-22 DIAGNOSIS — T839XXD Unspecified complication of genitourinary prosthetic device, implant and graft, subsequent encounter: Secondary | ICD-10-CM | POA: Diagnosis not present

## 2024-01-22 DIAGNOSIS — R6520 Severe sepsis without septic shock: Secondary | ICD-10-CM | POA: Diagnosis not present

## 2024-01-22 DIAGNOSIS — E8729 Other acidosis: Secondary | ICD-10-CM | POA: Diagnosis not present

## 2024-01-22 DIAGNOSIS — D638 Anemia in other chronic diseases classified elsewhere: Secondary | ICD-10-CM | POA: Diagnosis not present

## 2024-01-23 ENCOUNTER — Telehealth: Payer: Self-pay | Admitting: Internal Medicine

## 2024-01-23 DIAGNOSIS — N3000 Acute cystitis without hematuria: Secondary | ICD-10-CM | POA: Diagnosis not present

## 2024-01-23 DIAGNOSIS — E8729 Other acidosis: Secondary | ICD-10-CM | POA: Diagnosis not present

## 2024-01-23 DIAGNOSIS — E871 Hypo-osmolality and hyponatremia: Secondary | ICD-10-CM | POA: Diagnosis not present

## 2024-01-23 DIAGNOSIS — R6520 Severe sepsis without septic shock: Secondary | ICD-10-CM | POA: Diagnosis not present

## 2024-01-23 DIAGNOSIS — D638 Anemia in other chronic diseases classified elsewhere: Secondary | ICD-10-CM | POA: Diagnosis not present

## 2024-01-23 DIAGNOSIS — N390 Urinary tract infection, site not specified: Secondary | ICD-10-CM | POA: Diagnosis not present

## 2024-01-23 DIAGNOSIS — K219 Gastro-esophageal reflux disease without esophagitis: Secondary | ICD-10-CM | POA: Diagnosis not present

## 2024-01-23 DIAGNOSIS — N400 Enlarged prostate without lower urinary tract symptoms: Secondary | ICD-10-CM | POA: Diagnosis not present

## 2024-01-23 DIAGNOSIS — T839XXD Unspecified complication of genitourinary prosthetic device, implant and graft, subsequent encounter: Secondary | ICD-10-CM | POA: Diagnosis not present

## 2024-01-23 NOTE — Telephone Encounter (Signed)
 [facility] faxed Home Health Certificate (Order [place] [id number]), to be filled out by provider. Patient requested to send it back via Fax within ASAP. Document is located in providers tray at front office.Please advise at [phone].

## 2024-01-23 NOTE — Telephone Encounter (Signed)
 Received placed on provider desk to be signed.

## 2024-01-24 DIAGNOSIS — N3000 Acute cystitis without hematuria: Secondary | ICD-10-CM | POA: Diagnosis not present

## 2024-01-24 DIAGNOSIS — R6520 Severe sepsis without septic shock: Secondary | ICD-10-CM | POA: Diagnosis not present

## 2024-01-24 DIAGNOSIS — N390 Urinary tract infection, site not specified: Secondary | ICD-10-CM | POA: Diagnosis not present

## 2024-01-24 DIAGNOSIS — E8729 Other acidosis: Secondary | ICD-10-CM | POA: Diagnosis not present

## 2024-01-24 DIAGNOSIS — N400 Enlarged prostate without lower urinary tract symptoms: Secondary | ICD-10-CM | POA: Diagnosis not present

## 2024-01-24 DIAGNOSIS — E871 Hypo-osmolality and hyponatremia: Secondary | ICD-10-CM | POA: Diagnosis not present

## 2024-01-24 DIAGNOSIS — D638 Anemia in other chronic diseases classified elsewhere: Secondary | ICD-10-CM | POA: Diagnosis not present

## 2024-01-24 DIAGNOSIS — T839XXD Unspecified complication of genitourinary prosthetic device, implant and graft, subsequent encounter: Secondary | ICD-10-CM | POA: Diagnosis not present

## 2024-01-24 DIAGNOSIS — K219 Gastro-esophageal reflux disease without esophagitis: Secondary | ICD-10-CM | POA: Diagnosis not present

## 2024-01-27 ENCOUNTER — Ambulatory Visit: Admitting: Internal Medicine

## 2024-01-27 DIAGNOSIS — N400 Enlarged prostate without lower urinary tract symptoms: Secondary | ICD-10-CM | POA: Diagnosis not present

## 2024-01-27 DIAGNOSIS — K219 Gastro-esophageal reflux disease without esophagitis: Secondary | ICD-10-CM | POA: Diagnosis not present

## 2024-01-27 DIAGNOSIS — D638 Anemia in other chronic diseases classified elsewhere: Secondary | ICD-10-CM | POA: Diagnosis not present

## 2024-01-27 DIAGNOSIS — B377 Candidal sepsis: Secondary | ICD-10-CM | POA: Diagnosis not present

## 2024-01-27 DIAGNOSIS — E871 Hypo-osmolality and hyponatremia: Secondary | ICD-10-CM | POA: Diagnosis not present

## 2024-01-27 DIAGNOSIS — N1831 Chronic kidney disease, stage 3a: Secondary | ICD-10-CM | POA: Diagnosis not present

## 2024-01-27 DIAGNOSIS — T839XXD Unspecified complication of genitourinary prosthetic device, implant and graft, subsequent encounter: Secondary | ICD-10-CM | POA: Diagnosis not present

## 2024-01-27 DIAGNOSIS — N179 Acute kidney failure, unspecified: Secondary | ICD-10-CM

## 2024-01-27 DIAGNOSIS — N3000 Acute cystitis without hematuria: Secondary | ICD-10-CM | POA: Diagnosis not present

## 2024-01-27 DIAGNOSIS — E8729 Other acidosis: Secondary | ICD-10-CM | POA: Diagnosis not present

## 2024-01-27 DIAGNOSIS — U071 COVID-19: Secondary | ICD-10-CM | POA: Diagnosis not present

## 2024-01-27 DIAGNOSIS — D631 Anemia in chronic kidney disease: Secondary | ICD-10-CM | POA: Diagnosis not present

## 2024-01-27 DIAGNOSIS — N390 Urinary tract infection, site not specified: Secondary | ICD-10-CM | POA: Diagnosis not present

## 2024-01-27 DIAGNOSIS — R0603 Acute respiratory distress: Secondary | ICD-10-CM | POA: Diagnosis not present

## 2024-01-27 DIAGNOSIS — R6520 Severe sepsis without septic shock: Secondary | ICD-10-CM | POA: Diagnosis not present

## 2024-01-27 DIAGNOSIS — I5043 Acute on chronic combined systolic (congestive) and diastolic (congestive) heart failure: Secondary | ICD-10-CM | POA: Diagnosis not present

## 2024-01-27 DIAGNOSIS — F0393 Unspecified dementia, unspecified severity, with mood disturbance: Secondary | ICD-10-CM

## 2024-01-27 DIAGNOSIS — A4151 Sepsis due to Escherichia coli [E. coli]: Secondary | ICD-10-CM | POA: Diagnosis not present

## 2024-01-27 DIAGNOSIS — F0394 Unspecified dementia, unspecified severity, with anxiety: Secondary | ICD-10-CM

## 2024-01-27 DIAGNOSIS — N136 Pyonephrosis: Secondary | ICD-10-CM | POA: Diagnosis not present

## 2024-01-28 ENCOUNTER — Telehealth: Payer: Self-pay

## 2024-01-28 DIAGNOSIS — K219 Gastro-esophageal reflux disease without esophagitis: Secondary | ICD-10-CM | POA: Diagnosis not present

## 2024-01-28 DIAGNOSIS — N390 Urinary tract infection, site not specified: Secondary | ICD-10-CM | POA: Diagnosis not present

## 2024-01-28 DIAGNOSIS — N400 Enlarged prostate without lower urinary tract symptoms: Secondary | ICD-10-CM | POA: Diagnosis not present

## 2024-01-28 DIAGNOSIS — T839XXD Unspecified complication of genitourinary prosthetic device, implant and graft, subsequent encounter: Secondary | ICD-10-CM | POA: Diagnosis not present

## 2024-01-28 DIAGNOSIS — R6520 Severe sepsis without septic shock: Secondary | ICD-10-CM | POA: Diagnosis not present

## 2024-01-28 DIAGNOSIS — E871 Hypo-osmolality and hyponatremia: Secondary | ICD-10-CM | POA: Diagnosis not present

## 2024-01-28 DIAGNOSIS — E8729 Other acidosis: Secondary | ICD-10-CM | POA: Diagnosis not present

## 2024-01-28 DIAGNOSIS — N3000 Acute cystitis without hematuria: Secondary | ICD-10-CM | POA: Diagnosis not present

## 2024-01-28 DIAGNOSIS — D638 Anemia in other chronic diseases classified elsewhere: Secondary | ICD-10-CM | POA: Diagnosis not present

## 2024-01-28 NOTE — Telephone Encounter (Signed)
 Faxed over Home health certification and plan of care to [phone]

## 2024-01-29 ENCOUNTER — Other Ambulatory Visit: Payer: Self-pay | Admitting: Internal Medicine

## 2024-01-29 DIAGNOSIS — D638 Anemia in other chronic diseases classified elsewhere: Secondary | ICD-10-CM | POA: Diagnosis not present

## 2024-01-29 DIAGNOSIS — K219 Gastro-esophageal reflux disease without esophagitis: Secondary | ICD-10-CM | POA: Diagnosis not present

## 2024-01-29 DIAGNOSIS — N3000 Acute cystitis without hematuria: Secondary | ICD-10-CM | POA: Diagnosis not present

## 2024-01-29 DIAGNOSIS — N390 Urinary tract infection, site not specified: Secondary | ICD-10-CM | POA: Diagnosis not present

## 2024-01-29 DIAGNOSIS — R6520 Severe sepsis without septic shock: Secondary | ICD-10-CM | POA: Diagnosis not present

## 2024-01-29 DIAGNOSIS — I10 Essential (primary) hypertension: Secondary | ICD-10-CM

## 2024-01-29 DIAGNOSIS — N400 Enlarged prostate without lower urinary tract symptoms: Secondary | ICD-10-CM | POA: Diagnosis not present

## 2024-01-29 DIAGNOSIS — T839XXD Unspecified complication of genitourinary prosthetic device, implant and graft, subsequent encounter: Secondary | ICD-10-CM | POA: Diagnosis not present

## 2024-01-29 DIAGNOSIS — E8729 Other acidosis: Secondary | ICD-10-CM | POA: Diagnosis not present

## 2024-01-29 DIAGNOSIS — E7800 Pure hypercholesterolemia, unspecified: Secondary | ICD-10-CM

## 2024-01-29 DIAGNOSIS — E871 Hypo-osmolality and hyponatremia: Secondary | ICD-10-CM | POA: Diagnosis not present

## 2024-01-30 DIAGNOSIS — E871 Hypo-osmolality and hyponatremia: Secondary | ICD-10-CM | POA: Diagnosis not present

## 2024-01-30 DIAGNOSIS — N400 Enlarged prostate without lower urinary tract symptoms: Secondary | ICD-10-CM | POA: Diagnosis not present

## 2024-01-30 DIAGNOSIS — T839XXD Unspecified complication of genitourinary prosthetic device, implant and graft, subsequent encounter: Secondary | ICD-10-CM | POA: Diagnosis not present

## 2024-01-30 DIAGNOSIS — K219 Gastro-esophageal reflux disease without esophagitis: Secondary | ICD-10-CM | POA: Diagnosis not present

## 2024-01-30 DIAGNOSIS — D638 Anemia in other chronic diseases classified elsewhere: Secondary | ICD-10-CM | POA: Diagnosis not present

## 2024-01-30 DIAGNOSIS — E8729 Other acidosis: Secondary | ICD-10-CM | POA: Diagnosis not present

## 2024-01-30 DIAGNOSIS — R6520 Severe sepsis without septic shock: Secondary | ICD-10-CM | POA: Diagnosis not present

## 2024-01-30 DIAGNOSIS — N3000 Acute cystitis without hematuria: Secondary | ICD-10-CM | POA: Diagnosis not present

## 2024-01-30 DIAGNOSIS — N390 Urinary tract infection, site not specified: Secondary | ICD-10-CM | POA: Diagnosis not present

## 2024-01-31 ENCOUNTER — Telehealth: Payer: Self-pay

## 2024-01-31 DIAGNOSIS — N3000 Acute cystitis without hematuria: Secondary | ICD-10-CM | POA: Diagnosis not present

## 2024-01-31 DIAGNOSIS — T839XXD Unspecified complication of genitourinary prosthetic device, implant and graft, subsequent encounter: Secondary | ICD-10-CM | POA: Diagnosis not present

## 2024-01-31 DIAGNOSIS — E8729 Other acidosis: Secondary | ICD-10-CM | POA: Diagnosis not present

## 2024-01-31 DIAGNOSIS — N390 Urinary tract infection, site not specified: Secondary | ICD-10-CM | POA: Diagnosis not present

## 2024-01-31 DIAGNOSIS — D638 Anemia in other chronic diseases classified elsewhere: Secondary | ICD-10-CM | POA: Diagnosis not present

## 2024-01-31 DIAGNOSIS — N400 Enlarged prostate without lower urinary tract symptoms: Secondary | ICD-10-CM | POA: Diagnosis not present

## 2024-01-31 DIAGNOSIS — E871 Hypo-osmolality and hyponatremia: Secondary | ICD-10-CM | POA: Diagnosis not present

## 2024-01-31 DIAGNOSIS — R6520 Severe sepsis without septic shock: Secondary | ICD-10-CM | POA: Diagnosis not present

## 2024-01-31 DIAGNOSIS — K219 Gastro-esophageal reflux disease without esophagitis: Secondary | ICD-10-CM | POA: Diagnosis not present

## 2024-01-31 NOTE — Telephone Encounter (Signed)
 Advised wife that pt needs appt she stated she is not able to bring pt in our office til oct 1st. Pt is unable to drive now.

## 2024-01-31 NOTE — Telephone Encounter (Signed)
 Spoke with pt wife letting her know to contiune with the fibercon but if causes to much diarrhea its fine to stop for now. Advise to take ondansetron  when nausea. Pt wife is concerned about all medication but will give pt any meds that are needed for him. I did advise pt needs appt to go over all medication. Pt wife stated pt is not able to drive right now and she will not be able to make to bring him October.

## 2024-01-31 NOTE — Telephone Encounter (Signed)
 Spoke with pt wife about new medication fibercon to help pt used the bathroom. Pt wife states it watery now does he needs to continue he has 7 tablets left he has taken 21 he has been in rehab since his surgery. Also pt has ondansetron  4mg  for nausea does he need that still. Pt wife states is it fine to eat whatever would like advise from provider til he can get in for appt

## 2024-01-31 NOTE — Telephone Encounter (Signed)
 Copied from CRM [id number]. Topic: Clinical - Medical Advice >> Jan 31, 2024 12:36 PM Franky GRADE wrote: Reason for CRM: Patient's spouse is calling because she picked up the patient from the [facility] and was given new medication that the spouse is not aware of it. She would like Dr.Morrison to review the medication to see if he should be taking them. I offered an appointment but Dr.Morrison's next available date is not until 02/24/2024 and she would not like to wait that long. She is reviewing the medications because it was a lot.

## 2024-02-04 ENCOUNTER — Telehealth: Payer: Self-pay

## 2024-02-04 ENCOUNTER — Other Ambulatory Visit: Payer: Self-pay

## 2024-02-04 DIAGNOSIS — Z789 Other specified health status: Secondary | ICD-10-CM

## 2024-02-04 DIAGNOSIS — T50905A Adverse effect of unspecified drugs, medicaments and biological substances, initial encounter: Secondary | ICD-10-CM

## 2024-02-04 NOTE — Telephone Encounter (Signed)
 Spoke with pt wife stated could do VV if later time she will check her days and would like to see what pcp has this week.

## 2024-02-04 NOTE — Telephone Encounter (Signed)
 Referral for pharmacy with tammy has been placed.

## 2024-02-04 NOTE — Telephone Encounter (Signed)
 Copied from CRM [id number]. Topic: Clinical - Medical Advice >> Feb 04, 2024  9:39 AM Chiquita SQUIBB wrote: Reason for CRM: Patients wife is calling in asking if the doctor can recommend anyone to help change his coloscopy bag. The patient had his bag fall off this morning and need help to put it back on. Patients wife would like someone to show her how to do. Please advise the patients wife.  Spoke with wife about coloscopy bag stated that she got it back on but during the night come off having little trouble still with it. She is going to call home health to help her with it she will call office back about it if she is still having trouble with the bag.

## 2024-02-05 ENCOUNTER — Inpatient Hospital Stay (HOSPITAL_COMMUNITY)

## 2024-02-05 ENCOUNTER — Encounter (HOSPITAL_COMMUNITY): Payer: Self-pay | Admitting: Emergency Medicine

## 2024-02-05 ENCOUNTER — Other Ambulatory Visit: Payer: Self-pay

## 2024-02-05 ENCOUNTER — Emergency Department (HOSPITAL_COMMUNITY)

## 2024-02-05 ENCOUNTER — Inpatient Hospital Stay (HOSPITAL_COMMUNITY)
Admission: EM | Admit: 2024-02-05 | Discharge: 2024-02-09 | DRG: 698 | Disposition: A | Discharge status: Skilled Nursing Facility | Attending: Internal Medicine | Admitting: Internal Medicine

## 2024-02-05 ENCOUNTER — Telehealth: Payer: Self-pay | Admitting: *Deleted

## 2024-02-05 ENCOUNTER — Telehealth: Payer: Self-pay

## 2024-02-05 DIAGNOSIS — Z888 Allergy status to other drugs, medicaments and biological substances status: Secondary | ICD-10-CM

## 2024-02-05 DIAGNOSIS — A419 Sepsis, unspecified organism: Secondary | ICD-10-CM | POA: Diagnosis not present

## 2024-02-05 DIAGNOSIS — N390 Urinary tract infection, site not specified: Secondary | ICD-10-CM | POA: Diagnosis not present

## 2024-02-05 DIAGNOSIS — I130 Hypertensive heart and chronic kidney disease with heart failure and stage 1 through stage 4 chronic kidney disease, or unspecified chronic kidney disease: Secondary | ICD-10-CM | POA: Diagnosis present

## 2024-02-05 DIAGNOSIS — N179 Acute kidney failure, unspecified: Secondary | ICD-10-CM | POA: Diagnosis present

## 2024-02-05 DIAGNOSIS — E860 Dehydration: Secondary | ICD-10-CM | POA: Diagnosis present

## 2024-02-05 DIAGNOSIS — F0390 Unspecified dementia without behavioral disturbance: Secondary | ICD-10-CM | POA: Diagnosis present

## 2024-02-05 DIAGNOSIS — D631 Anemia in chronic kidney disease: Secondary | ICD-10-CM | POA: Diagnosis present

## 2024-02-05 DIAGNOSIS — F0394 Unspecified dementia, unspecified severity, with anxiety: Secondary | ICD-10-CM | POA: Diagnosis present

## 2024-02-05 DIAGNOSIS — N400 Enlarged prostate without lower urinary tract symptoms: Secondary | ICD-10-CM | POA: Diagnosis present

## 2024-02-05 DIAGNOSIS — Z8719 Personal history of other diseases of the digestive system: Secondary | ICD-10-CM

## 2024-02-05 DIAGNOSIS — R109 Unspecified abdominal pain: Secondary | ICD-10-CM | POA: Diagnosis not present

## 2024-02-05 DIAGNOSIS — N189 Chronic kidney disease, unspecified: Secondary | ICD-10-CM | POA: Diagnosis present

## 2024-02-05 DIAGNOSIS — Z8619 Personal history of other infectious and parasitic diseases: Secondary | ICD-10-CM

## 2024-02-05 DIAGNOSIS — I5022 Chronic systolic (congestive) heart failure: Secondary | ICD-10-CM | POA: Diagnosis present

## 2024-02-05 DIAGNOSIS — I77810 Thoracic aortic ectasia: Secondary | ICD-10-CM | POA: Diagnosis present

## 2024-02-05 DIAGNOSIS — R5381 Other malaise: Secondary | ICD-10-CM | POA: Diagnosis present

## 2024-02-05 DIAGNOSIS — I420 Dilated cardiomyopathy: Secondary | ICD-10-CM | POA: Diagnosis not present

## 2024-02-05 DIAGNOSIS — Z978 Presence of other specified devices: Secondary | ICD-10-CM

## 2024-02-05 DIAGNOSIS — T839XXD Unspecified complication of genitourinary prosthetic device, implant and graft, subsequent encounter: Secondary | ICD-10-CM | POA: Diagnosis not present

## 2024-02-05 DIAGNOSIS — Z8616 Personal history of COVID-19: Secondary | ICD-10-CM

## 2024-02-05 DIAGNOSIS — T83518A Infection and inflammatory reaction due to other urinary catheter, initial encounter: Secondary | ICD-10-CM | POA: Diagnosis not present

## 2024-02-05 DIAGNOSIS — Z7401 Bed confinement status: Secondary | ICD-10-CM | POA: Diagnosis not present

## 2024-02-05 DIAGNOSIS — Z932 Ileostomy status: Secondary | ICD-10-CM

## 2024-02-05 DIAGNOSIS — R410 Disorientation, unspecified: Secondary | ICD-10-CM | POA: Diagnosis not present

## 2024-02-05 DIAGNOSIS — K5732 Diverticulitis of large intestine without perforation or abscess without bleeding: Secondary | ICD-10-CM | POA: Diagnosis present

## 2024-02-05 DIAGNOSIS — E669 Obesity, unspecified: Secondary | ICD-10-CM | POA: Diagnosis present

## 2024-02-05 DIAGNOSIS — N321 Vesicointestinal fistula: Secondary | ICD-10-CM | POA: Diagnosis present

## 2024-02-05 DIAGNOSIS — D696 Thrombocytopenia, unspecified: Secondary | ICD-10-CM | POA: Diagnosis present

## 2024-02-05 DIAGNOSIS — N1831 Chronic kidney disease, stage 3a: Secondary | ICD-10-CM | POA: Diagnosis not present

## 2024-02-05 DIAGNOSIS — B961 Klebsiella pneumoniae [K. pneumoniae] as the cause of diseases classified elsewhere: Secondary | ICD-10-CM | POA: Diagnosis present

## 2024-02-05 DIAGNOSIS — N1330 Unspecified hydronephrosis: Secondary | ICD-10-CM | POA: Diagnosis not present

## 2024-02-05 DIAGNOSIS — D638 Anemia in other chronic diseases classified elsewhere: Secondary | ICD-10-CM | POA: Diagnosis not present

## 2024-02-05 DIAGNOSIS — R6520 Severe sepsis without septic shock: Secondary | ICD-10-CM | POA: Diagnosis not present

## 2024-02-05 DIAGNOSIS — E440 Moderate protein-calorie malnutrition: Secondary | ICD-10-CM | POA: Diagnosis not present

## 2024-02-05 DIAGNOSIS — G4733 Obstructive sleep apnea (adult) (pediatric): Secondary | ICD-10-CM | POA: Diagnosis present

## 2024-02-05 DIAGNOSIS — T83511A Infection and inflammatory reaction due to indwelling urethral catheter, initial encounter: Secondary | ICD-10-CM | POA: Diagnosis not present

## 2024-02-05 DIAGNOSIS — F3130 Bipolar disorder, current episode depressed, mild or moderate severity, unspecified: Secondary | ICD-10-CM | POA: Diagnosis present

## 2024-02-05 DIAGNOSIS — E785 Hyperlipidemia, unspecified: Secondary | ICD-10-CM | POA: Diagnosis present

## 2024-02-05 DIAGNOSIS — I447 Left bundle-branch block, unspecified: Secondary | ICD-10-CM | POA: Diagnosis present

## 2024-02-05 DIAGNOSIS — E611 Iron deficiency: Secondary | ICD-10-CM | POA: Diagnosis present

## 2024-02-05 DIAGNOSIS — Z6835 Body mass index (BMI) 35.0-35.9, adult: Secondary | ICD-10-CM

## 2024-02-05 DIAGNOSIS — D649 Anemia, unspecified: Secondary | ICD-10-CM

## 2024-02-05 DIAGNOSIS — K219 Gastro-esophageal reflux disease without esophagitis: Secondary | ICD-10-CM | POA: Diagnosis not present

## 2024-02-05 DIAGNOSIS — Y846 Urinary catheterization as the cause of abnormal reaction of the patient, or of later complication, without mention of misadventure at the time of the procedure: Secondary | ICD-10-CM | POA: Diagnosis present

## 2024-02-05 DIAGNOSIS — R627 Adult failure to thrive: Secondary | ICD-10-CM | POA: Diagnosis not present

## 2024-02-05 DIAGNOSIS — T148XXA Other injury of unspecified body region, initial encounter: Secondary | ICD-10-CM

## 2024-02-05 DIAGNOSIS — R531 Weakness: Principal | ICD-10-CM

## 2024-02-05 DIAGNOSIS — Z79899 Other long term (current) drug therapy: Secondary | ICD-10-CM

## 2024-02-05 DIAGNOSIS — E871 Hypo-osmolality and hyponatremia: Secondary | ICD-10-CM | POA: Diagnosis not present

## 2024-02-05 DIAGNOSIS — Z8711 Personal history of peptic ulcer disease: Secondary | ICD-10-CM

## 2024-02-05 DIAGNOSIS — D500 Iron deficiency anemia secondary to blood loss (chronic): Secondary | ICD-10-CM | POA: Diagnosis present

## 2024-02-05 DIAGNOSIS — R41841 Cognitive communication deficit: Secondary | ICD-10-CM | POA: Diagnosis not present

## 2024-02-05 DIAGNOSIS — Z8701 Personal history of pneumonia (recurrent): Secondary | ICD-10-CM

## 2024-02-05 DIAGNOSIS — R21 Rash and other nonspecific skin eruption: Secondary | ICD-10-CM | POA: Diagnosis not present

## 2024-02-05 DIAGNOSIS — M6281 Muscle weakness (generalized): Secondary | ICD-10-CM | POA: Diagnosis not present

## 2024-02-05 DIAGNOSIS — F0393 Unspecified dementia, unspecified severity, with mood disturbance: Secondary | ICD-10-CM | POA: Diagnosis not present

## 2024-02-05 DIAGNOSIS — Z85828 Personal history of other malignant neoplasm of skin: Secondary | ICD-10-CM

## 2024-02-05 DIAGNOSIS — Z8249 Family history of ischemic heart disease and other diseases of the circulatory system: Secondary | ICD-10-CM

## 2024-02-05 DIAGNOSIS — N3000 Acute cystitis without hematuria: Secondary | ICD-10-CM | POA: Diagnosis not present

## 2024-02-05 DIAGNOSIS — I10 Essential (primary) hypertension: Secondary | ICD-10-CM | POA: Diagnosis present

## 2024-02-05 DIAGNOSIS — E8729 Other acidosis: Secondary | ICD-10-CM | POA: Diagnosis not present

## 2024-02-05 DIAGNOSIS — A4159 Other Gram-negative sepsis: Secondary | ICD-10-CM | POA: Diagnosis present

## 2024-02-05 DIAGNOSIS — N35919 Unspecified urethral stricture, male, unspecified site: Secondary | ICD-10-CM | POA: Diagnosis present

## 2024-02-05 DIAGNOSIS — Z9049 Acquired absence of other specified parts of digestive tract: Secondary | ICD-10-CM

## 2024-02-05 DIAGNOSIS — R262 Difficulty in walking, not elsewhere classified: Secondary | ICD-10-CM | POA: Diagnosis not present

## 2024-02-05 DIAGNOSIS — R1084 Generalized abdominal pain: Secondary | ICD-10-CM | POA: Diagnosis not present

## 2024-02-05 DIAGNOSIS — Z87440 Personal history of urinary (tract) infections: Secondary | ICD-10-CM

## 2024-02-05 HISTORY — DX: Acute kidney failure, unspecified: N17.9

## 2024-02-05 LAB — URINALYSIS, W/ REFLEX TO CULTURE (INFECTION SUSPECTED)
Glucose, UA: NEGATIVE mg/dL
Ketones, ur: NEGATIVE mg/dL
Nitrite: POSITIVE — AB
Protein, ur: >300 mg/dL — AB
RBC / HPF: >50 RBC/hpf (ref 0–5)
Specific Gravity, Urine: 1.025 (ref 1.005–1.030)
WBC, UA: >50 WBC/hpf (ref 0–5)
pH: 6 (ref 5.0–8.0)

## 2024-02-05 LAB — COMPREHENSIVE METABOLIC PANEL WITH GFR
ALT: 10 U/L (ref 0–44)
AST: 23 U/L (ref 15–41)
Albumin: 3.6 g/dL (ref 3.5–5.0)
Alkaline Phosphatase: 84 U/L (ref 38–126)
Anion gap: 16 — ABNORMAL HIGH (ref 5–15)
BUN: 52 mg/dL — ABNORMAL HIGH (ref 8–23)
CO2: 15 mmol/L — ABNORMAL LOW (ref 22–32)
Calcium: 9.1 mg/dL (ref 8.9–10.3)
Chloride: 98 mmol/L (ref 98–111)
Creatinine, Ser: 3.16 mg/dL — ABNORMAL HIGH (ref 0.61–1.24)
GFR, Estimated: 20 mL/min — ABNORMAL LOW (ref >60)
Glucose, Bld: 132 mg/dL — ABNORMAL HIGH (ref 70–99)
Potassium: 5 mmol/L (ref 3.5–5.1)
Sodium: 129 mmol/L — ABNORMAL LOW (ref 135–145)
Total Bilirubin: 0.2 mg/dL (ref 0.0–1.2)
Total Protein: 7.4 g/dL (ref 6.5–8.1)

## 2024-02-05 LAB — CBC
HCT: 27.8 % — ABNORMAL LOW (ref 39.0–52.0)
Hemoglobin: 8.6 g/dL — ABNORMAL LOW (ref 13.0–17.0)
MCH: 27 pg (ref 26.0–34.0)
MCHC: 30.9 g/dL (ref 30.0–36.0)
MCV: 87.1 fL (ref 80.0–100.0)
Platelets: 128 K/uL — ABNORMAL LOW (ref 150–400)
RBC: 3.19 MIL/uL — ABNORMAL LOW (ref 4.22–5.81)
RDW: 14.6 % (ref 11.5–15.5)
WBC: 8.5 K/uL (ref 4.0–10.5)
nRBC: 0 % (ref 0.0–0.2)

## 2024-02-05 LAB — I-STAT CG4 LACTIC ACID, ED: Lactic Acid, Venous: 1.4 mmol/L (ref 0.5–1.9)

## 2024-02-05 LAB — CBC WITH DIFFERENTIAL/PLATELET
Abs Immature Granulocytes: 0.08 K/uL — ABNORMAL HIGH (ref 0.00–0.07)
Basophils Absolute: 0.1 K/uL (ref 0.0–0.1)
Basophils Relative: 1 %
Eosinophils Absolute: 0 K/uL (ref 0.0–0.5)
Eosinophils Relative: 0 %
HCT: 30.5 % — ABNORMAL LOW (ref 39.0–52.0)
Hemoglobin: 8.9 g/dL — ABNORMAL LOW (ref 13.0–17.0)
Immature Granulocytes: 1 %
Lymphocytes Relative: 6 %
Lymphs Abs: 0.5 K/uL — ABNORMAL LOW (ref 0.7–4.0)
MCH: 26.3 pg (ref 26.0–34.0)
MCHC: 29.2 g/dL — ABNORMAL LOW (ref 30.0–36.0)
MCV: 90.2 fL (ref 80.0–100.0)
Monocytes Absolute: 1 K/uL (ref 0.1–1.0)
Monocytes Relative: 11 %
Neutro Abs: 7.2 K/uL (ref 1.7–7.7)
Neutrophils Relative %: 81 %
Platelets: 136 K/uL — ABNORMAL LOW (ref 150–400)
RBC: 3.38 MIL/uL — ABNORMAL LOW (ref 4.22–5.81)
RDW: 14.7 % (ref 11.5–15.5)
Smear Review: NORMAL
WBC: 8.8 K/uL (ref 4.0–10.5)
nRBC: 0 % (ref 0.0–0.2)

## 2024-02-05 LAB — CREATININE, SERUM
Creatinine, Ser: 3 mg/dL — ABNORMAL HIGH (ref 0.61–1.24)
GFR, Estimated: 21 mL/min — ABNORMAL LOW (ref >60)

## 2024-02-05 MED ORDER — CALCIUM POLYCARBOPHIL 625 MG PO TABS
625.0000 mg | ORAL_TABLET | Freq: Two times a day (BID) | ORAL | Status: DC
  Administered 2024-02-05 – 2024-02-09 (×8): 625 mg via ORAL
  Filled 2024-02-05 (×8): qty 1

## 2024-02-05 MED ORDER — LOPERAMIDE HCL 2 MG PO CAPS
2.0000 mg | ORAL_CAPSULE | Freq: Two times a day (BID) | ORAL | Status: DC
  Administered 2024-02-05: 2 mg via ORAL
  Filled 2024-02-05: qty 1

## 2024-02-05 MED ORDER — ACETAMINOPHEN 650 MG RE SUPP: 650.0000 mg | Freq: Four times a day (QID) | RECTAL | Status: DC | PRN

## 2024-02-05 MED ORDER — VENLAFAXINE HCL ER 75 MG PO CP24
225.0000 mg | ORAL_CAPSULE | Freq: Every day | ORAL | Status: DC
  Administered 2024-02-06 – 2024-02-09 (×4): 225 mg via ORAL
  Filled 2024-02-05 (×4): qty 3

## 2024-02-05 MED ORDER — TRAZODONE HCL 100 MG PO TABS
100.0000 mg | ORAL_TABLET | Freq: Every day | ORAL | Status: DC
  Administered 2024-02-05 – 2024-02-08 (×4): 100 mg via ORAL
  Filled 2024-02-05 (×4): qty 1

## 2024-02-05 MED ORDER — HEPARIN SODIUM (PORCINE) 5000 UNIT/ML IJ SOLN
5000.0000 [IU] | Freq: Three times a day (TID) | INTRAMUSCULAR | Status: DC
  Administered 2024-02-05 – 2024-02-06 (×3): 5000 [IU] via SUBCUTANEOUS
  Filled 2024-02-05 (×3): qty 1

## 2024-02-05 MED ORDER — CARVEDILOL 3.125 MG PO TABS
6.2500 mg | ORAL_TABLET | Freq: Two times a day (BID) | ORAL | Status: DC
  Administered 2024-02-08 – 2024-02-09 (×3): 6.25 mg via ORAL
  Filled 2024-02-05 (×4): qty 2

## 2024-02-05 MED ORDER — VANCOMYCIN HCL IN DEXTROSE 1-5 GM/200ML-% IV SOLN
1000.0000 mg | Freq: Once | INTRAVENOUS | Status: AC
  Administered 2024-02-05: 1000 mg via INTRAVENOUS
  Filled 2024-02-05: qty 200

## 2024-02-05 MED ORDER — SODIUM CHLORIDE 0.9 % IV SOLN
2.0000 g | Freq: Once | INTRAVENOUS | Status: AC
  Administered 2024-02-05: 2 g via INTRAVENOUS
  Filled 2024-02-05: qty 12.5

## 2024-02-05 MED ORDER — LOPERAMIDE HCL 2 MG PO CAPS: 2.0000 mg | ORAL_CAPSULE | Freq: Four times a day (QID) | ORAL | Status: DC | PRN

## 2024-02-05 MED ORDER — METRONIDAZOLE 500 MG/100ML IV SOLN
500.0000 mg | Freq: Once | INTRAVENOUS | Status: AC
  Administered 2024-02-05: 500 mg via INTRAVENOUS
  Filled 2024-02-05: qty 100

## 2024-02-05 MED ORDER — LACTATED RINGERS IV SOLN: INTRAVENOUS | Status: DC

## 2024-02-05 MED ORDER — PRIMIDONE 50 MG PO TABS
50.0000 mg | ORAL_TABLET | Freq: Two times a day (BID) | ORAL | Status: DC
  Administered 2024-02-05 – 2024-02-09 (×8): 50 mg via ORAL
  Filled 2024-02-05 (×8): qty 1

## 2024-02-05 MED ORDER — LACTATED RINGERS IV BOLUS (SEPSIS)
1000.0000 mL | Freq: Once | INTRAVENOUS | Status: AC
  Administered 2024-02-05: 1000 mL via INTRAVENOUS

## 2024-02-05 MED ORDER — BUSPIRONE HCL 10 MG PO TABS
10.0000 mg | ORAL_TABLET | Freq: Three times a day (TID) | ORAL | Status: DC
  Administered 2024-02-05 – 2024-02-09 (×11): 10 mg via ORAL
  Filled 2024-02-05: qty 1
  Filled 2024-02-05 (×2): qty 2
  Filled 2024-02-05: qty 1
  Filled 2024-02-05 (×3): qty 2
  Filled 2024-02-05 (×3): qty 1
  Filled 2024-02-05: qty 2
  Filled 2024-02-05: qty 1
  Filled 2024-02-05: qty 2
  Filled 2024-02-05 (×4): qty 1
  Filled 2024-02-05 (×2): qty 2
  Filled 2024-02-05 (×2): qty 1
  Filled 2024-02-05: qty 2

## 2024-02-05 MED ORDER — PRAVASTATIN SODIUM 20 MG PO TABS
20.0000 mg | ORAL_TABLET | Freq: Every day | ORAL | Status: DC
  Administered 2024-02-06 – 2024-02-09 (×4): 20 mg via ORAL
  Filled 2024-02-05 (×4): qty 1

## 2024-02-05 MED ORDER — FINASTERIDE 5 MG PO TABS
5.0000 mg | ORAL_TABLET | Freq: Every day | ORAL | Status: DC
  Administered 2024-02-06 – 2024-02-09 (×4): 5 mg via ORAL
  Filled 2024-02-05 (×4): qty 1

## 2024-02-05 MED ORDER — SODIUM CHLORIDE 0.9 % IV SOLN: INTRAVENOUS | Status: DC

## 2024-02-05 MED ORDER — HYDROCODONE-ACETAMINOPHEN 5-325 MG PO TABS: 1.0000 | ORAL_TABLET | Freq: Three times a day (TID) | ORAL | Status: DC | PRN

## 2024-02-05 MED ORDER — VENLAFAXINE HCL ER 75 MG PO CP24: 75.0000 mg | ORAL_CAPSULE | Freq: Every day | ORAL | Status: DC

## 2024-02-05 MED ORDER — LACTATED RINGERS IV BOLUS
1000.0000 mL | Freq: Once | INTRAVENOUS | Status: AC
  Administered 2024-02-05: 1000 mL via INTRAVENOUS

## 2024-02-05 MED ORDER — MORPHINE SULFATE (PF) 2 MG/ML IV SOLN: 2.0000 mg | Freq: Four times a day (QID) | INTRAVENOUS | Status: DC | PRN

## 2024-02-05 MED ORDER — ARIPIPRAZOLE 2 MG PO TABS
2.0000 mg | ORAL_TABLET | Freq: Every day | ORAL | Status: DC
Start: 2024-02-06 — End: 2024-02-09
  Administered 2024-02-06 – 2024-02-09 (×4): 2 mg via ORAL
  Filled 2024-02-05 (×4): qty 1

## 2024-02-05 MED ORDER — PIPERACILLIN-TAZOBACTAM 3.375 G IVPB
3.3750 g | Freq: Three times a day (TID) | INTRAVENOUS | Status: DC
  Administered 2024-02-05 – 2024-02-06 (×2): 3.375 g via INTRAVENOUS
  Filled 2024-02-05 (×2): qty 50

## 2024-02-05 MED ORDER — MIRTAZAPINE 30 MG PO TABS
30.0000 mg | ORAL_TABLET | Freq: Every day | ORAL | Status: DC
Start: 2024-02-05 — End: 2024-02-09
  Administered 2024-02-05 – 2024-02-08 (×4): 30 mg via ORAL
  Filled 2024-02-05: qty 1
  Filled 2024-02-05 (×3): qty 2
  Filled 2024-02-05 (×2): qty 1
  Filled 2024-02-05: qty 2
  Filled 2024-02-05: qty 1

## 2024-02-05 MED ORDER — ACETAMINOPHEN 325 MG PO TABS
650.0000 mg | ORAL_TABLET | Freq: Four times a day (QID) | ORAL | Status: DC | PRN
  Administered 2024-02-05: 650 mg via ORAL
  Filled 2024-02-05: qty 2

## 2024-02-05 MED ORDER — CHLORHEXIDINE GLUCONATE CLOTH 2 % EX PADS
6.0000 | MEDICATED_PAD | Freq: Every day | CUTANEOUS | Status: DC
  Administered 2024-02-06 – 2024-02-09 (×4): 6 via TOPICAL

## 2024-02-05 MED ORDER — TAMSULOSIN HCL 0.4 MG PO CAPS
0.4000 mg | ORAL_CAPSULE | Freq: Every day | ORAL | Status: DC
  Administered 2024-02-06 – 2024-02-09 (×4): 0.4 mg via ORAL
  Filled 2024-02-05 (×4): qty 1

## 2024-02-05 MED ORDER — ONDANSETRON HCL 4 MG PO TABS: 4.0000 mg | ORAL_TABLET | Freq: Three times a day (TID) | ORAL | Status: DC | PRN

## 2024-02-05 NOTE — ED Triage Notes (Signed)
 Patient presents due to inability to care for himself after discharge from facility. Home health found him covered in bowel today and his colostomy bag missing. Skin is red around the colostomy site. Patient is ambulatory and walks with foley bag dragging behind him. He has also had a low appetite. EMS administered 100 ml of LR.    HX Dementia  EMS vitals: 90 HR 112/70 BP 169 CBg 98% SPO2 on room air 24 ET 24 RR

## 2024-02-05 NOTE — H&P (Signed)
 History and Physical    Patient: Calvin Escobar FMW:[id number] DOB: September 15, 1950 DOA: 02/05/2024 DOS: the patient was seen and examined on 02/05/2024 PCP: [name], MD  Patient coming from: Home  Chief Complaint:  Chief Complaint  Patient presents with   Weakness   HPI: Calvin Escobar is a 73 y.o. male with medical history significant of multiple medical problems including recent hospitalization for colovesical fistula/sigmoid diverticulitis for which he underwent robotic rectosigmoid resection with anastomosis, colovesical fistula takedown and repair, diverting ileostomy, drainage of pelvic abscess and lysis of adhesions.  He was subsequently discharged on 01/14/2024 to nursing home.  Patient also has history of urethral stricture and chronic indwelling Foley catheter, has had prior hospitalizations for recurrent UTI and bacteremia and has had previous cultures with different organisms including E. coli, Pseudomonas, Enterococcus, also had candidal bacteremia in the recent past.  He also has history of nonischemic cardiomyopathy/HFrEF with EF around 25 to 30%, CKD 3A, anemia of chronic disease, dementia, bipolar disorder, anxiety, depression, hyponatremia, CHF.  Patient states that he initially went to nursing home and then subsequently was discharged home from the nursing home.  However states that he has continued to have leakage from his ileostomy and he and his family have not been able to apply the bag.  He reports generalized weakness, decreased oral intake.  He denies any symptoms of fever, chills, abdominal pain, nausea, vomiting.  He does not report any chest pain or shortness of breath.  According to the ER records, when EMS came to pick him up, his ostomy bag was off and urine catheter was dragging on the ground as he walked.  Appears to have cloudy urine in the Foley bag.  His workup in the ED shows that he is afebrile, slightly tachycardic.  Blood pressure appears to be  relatively stable.  He is on room air.  His WBC count is 8.8, hemoglobin 8.9, platelets are 136.  Serum sodium 129, potassium 5.0.  BUN 52 and creatinine is 3.16 that is higher than his baseline of 1.3-1.4.  Urinalysis appears to show RBCs, WBCs, many bacteria, positive nitrate, large leukocyte esterase, positive for protein as well.  Lactate is normal at 1.4. Chest x-ray does not reveal any active disease.  Severe degenerative disease of left shoulder.  Review of Systems: As mentioned in the history of present illness. All other systems reviewed and are negative. Past Medical History:  Diagnosis Date   Acute cystitis 05/13/2023   Acute hyponatremia 05/13/2023   Acute kidney injury superimposed on chronic kidney disease (HCC) 05/13/2023   CLINICAL CONTEXT: 73 year old male with stage 3 CKD, colovesical fistula, recurrent UTIs, and recent candidal UTI. Recent renal ultrasound (09/28/2023) confirmed mild left hydronephrosis and prostatic mass protruding into bladder base. Iron  studies consistent with anemia of chronic disease (iron  32, TIBC 238, ferritin 420).   DIAGNOSTIC ASSESSMENT: ? 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 History of GI bleeding (07/2023). Currently on ferrous sulfate  supplementation.  DIAGNOSTIC ASSESSMENT: ? Primary etiology (70-80% probability): Anemia of chronic disease/inflammation ? 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  EF 35 to 40% on echo 04/2021   Dementia (HCC)    Depression    Diarrhea 05/13/2023   E coli bacteremia 05/14/2023   High anion gap metabolic acidosis 07/13/2023   History of Clostridioides difficile colitis 08/15/2023   Associated with colovesical fistula? Recurrent antibiotic(s) requirement     Hyperkalemia    Associated with losartan   Advised patient low potassium diet 08/2022             Lab Results      Component    Value    Date/Time           K    4.0    12/27/2023 01:20 PM           K    4.4    12/18/2023 05:07 AM           K    4.4    12/17/2023 06:01 AM           K    4.7    12/16/2023 06:15 AM           K    4.9    12/15/2023 08:21 AM           K    4.4    12/14/2023 06:00 AM           K    5.0    Hyperlipidemia    Hypertension    Leukocytosis 12/11/2023   Lab Results      Component    Value    Date/Time           WBC    6.2    12/27/2023 01:20 PM           WBC    11.6 (H)    12/18/2023 05:07 AM           WBC    9.6    12/17/2023 06:01 AM           WBC    9.4    12/16/2023 06:15 AM           WBC    12.7 (H)    12/15/2023 08:21 AM           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  Work-up with cardiology in January 2021.     catheterization    Assessment:     1. Normal coronaries  2. NICM with EF 25-30%  3. Normal hemodynamics  .     Pneumonia    PUD (peptic ulcer disease) 01/12/2015   Formatting of this note might be different from the original.  Endoscopy esophageal stricture 2016     Septic shock from UTI 06/02/2023   Severe sepsis (HCC) 06/03/2023   SIRS  (systemic inflammatory response syndrome) (HCC) 12/11/2023   SKIN CANCER, HX OF 05/10/2007   Facial left check and forehead and r shoulder  F/w derm   Syncope 05/13/2023   Thrombocytopenia (HCC)  06/02/2023   Lab Results      Component    Value    Date           PLT    301    07/19/2023           PLT    324    07/18/2023           PLT    268    07/16/2023           PLT    207    07/14/2023           PLT    228    07/13/2023             Lab Results      Component    Value    Date           ESRSEDRATE    60 (H)    05/08/2023     No results found for: CRP          Lab Results      Component    Value       Urinary catheter complication (HCC) 06/24/2023   Urinary dribbling 03/14/2022   Urinary incontinence 11/21/2022      Urinary Incontinence: They are wearing disposable diapers daily due to urinary incontinence, primarily nocturnal, and are currently on Tamsulosin  (Flomax ). We will refer them to Urology for further evaluation and potential treatment options and continue Tamsulosin  until they are seen by Urology.     Urinary tract infection without hematuria 06/02/2023   UTI (urinary tract infection) 07/01/2023   Past Surgical History:  Procedure Laterality Date   COLECTOMY, SIGMOID, ROBOT-ASSISTED N/A 01/09/2024   Procedure: ROBOTIC LOW ANTERIOR RESECTION WITH ANASTOMOSIS, DRAINAGE OF PELVIC ABSCESS, COLOVESICAL FISTULA TAKEDOWN AND REPAIR, INTRAOPERATIVE ASSESSMENT OF TISSUE PERFUSION USING ICG DYE, AND BILATERAL TAP BLOCK;  Surgeon: Sheldon Standing, MD;  Location: [facility];  Service: General;  Laterality: N/A;  ROBOTIC RESECTION OF COLON RECTOSIGMOID   COLONOSCOPY     COLONOSCOPY N/A 12/05/2023   Procedure: COLONOSCOPY;  Surgeon: Leigh Standing SQUIBB, MD;  Location: [facility];  Service: Gastroenterology;  Laterality: N/A;   CYSTOSCOPY WITH INDOCYANINE GREEN  IMAGING (ICG) N/A 01/09/2024   Procedure: CYSTOSCOPY WITH INDOCYANINE GREEN  IMAGING (ICG);  Surgeon: Watt Rush, MD;  Location: [facility];  Service:  Urology;  Laterality: N/A;  CYSTO WITH FIREFLY INJECTION   FLEXIBLE SIGMOIDOSCOPY N/A 01/09/2024   Procedure: SIGMOIDOSCOPY, FLEXIBLE;  Surgeon: Sheldon Standing, MD;  Location: [facility];  Service: General;  Laterality: N/A;   LAPAROSCOPIC LOOP COLOSTOMY N/A 01/09/2024   Procedure: CREATION OF DIVERTING LOOP ILEOSTOMY;  Surgeon: Sheldon Standing, MD;  Location: [facility];  Service: General;  Laterality: N/A;   RIGHT/LEFT HEART CATH AND CORONARY ANGIOGRAPHY N/A 07/03/2019   Procedure: RIGHT/LEFT HEART CATH AND CORONARY ANGIOGRAPHY;  Surgeon: Cherrie Toribio SAUNDERS, MD;  Location: [facility];  Service: Cardiovascular;  Laterality: N/A;   ROBOTIC ASSISTED LAPAROSCOPIC LYSIS OF ADHESION  01/09/2024   Procedure: LYSIS, ADHESIONS, ROBOT-ASSISTED, LAPAROSCOPIC;  Surgeon: Sheldon Standing, MD;  Location: [facility];  Service: General;;   Social History:  reports that he has never smoked. He has never used smokeless tobacco. He reports that he does not currently use alcohol. He reports that he does not use drugs.  Allergies  Allergen Reactions   Albuterol  Palpitations    Supraventricular Tachycardia     Family History  Problem Relation Age of Onset   Hypertension Mother  Cancer Father    Bone cancer Sister     Prior to Admission medications   Medication Sig Start Date End Date Taking? Authorizing Provider  ARIPiprazole  (ABILIFY ) 2 MG tablet Take 1 tablet (2 mg total) by mouth in the morning. 08/22/23   [name], MD  busPIRone  (BUSPAR ) 10 MG tablet Take 1 tablet (10 mg total) by mouth 3 (three) times daily. 09/16/23   [name], MD  carvedilol  (COREG ) 12.5 MG tablet TAKE 1 TABLET (12.5MG  TOTAL) BY MOUTH TWICE A DAY WITH MEALS 01/29/24   [name], MD  ferrous sulfate  325 (65 FE) MG tablet Take 1 tablet (325 mg total) by mouth 2 (two) times daily with a meal. 01/14/24   Sheldon Standing, MD  finasteride  (PROSCAR ) 5 MG tablet Take 5 mg by mouth daily. 11/06/23   [provider]  folic acid   (FOLVITE ) 1 MG tablet Take 1 mg by mouth daily. 10/21/23   [provider]  gabapentin  (NEURONTIN ) 300 MG capsule Take 300 mg by mouth at bedtime. 01/01/24   [provider]  HYDROcodone -acetaminophen  (NORCO/VICODIN) 5-325 MG tablet Take 1-2 tablets by mouth every 6 (six) hours as needed for moderate pain (pain score 4-6) or severe pain (pain score 7-10). 01/14/24   Edmundo Marjorie Lapine, PA-C  loperamide  (IMODIUM ) 2 MG capsule Take 1 capsule (2 mg total) by mouth 2 (two) times daily. 01/14/24   Sheldon Standing, MD  loperamide  (IMODIUM ) 2 MG capsule Take 1-2 capsules (2-4 mg total) by mouth every 6 (six) hours as needed for diarrhea or loose stools (Use if >2 BM every 8 hours). 01/14/24   Sheldon Standing, MD  losartan  (COZAAR ) 50 MG tablet TAKE 1 TABLET (50 MG TOTAL) BY MOUTH DAILY. REPLACES ENTRESTO , START AFTER DONE WITH ENTRESTO  01/29/24   [name], MD  melatonin 3 MG TABS tablet Take 1 tablet (3 mg total) by mouth at bedtime as needed (insomnia). 07/19/23   Rai, Nydia POUR, MD  mirtazapine  (REMERON ) 30 MG tablet Take 1 tablet (30 mg total) by mouth at bedtime. 09/16/23   [name], MD  ondansetron  (ZOFRAN ) 4 MG tablet Take 4 mg by mouth every 8 (eight) hours as needed for nausea. 01/07/24   [provider]  polycarbophil (FIBERCON) 625 MG tablet Take 1 tablet (625 mg total) by mouth 2 (two) times daily. 01/14/24   Sheldon Standing, MD  pravastatin  (PRAVACHOL ) 20 MG tablet TAKE 1 TABLET BY MOUTH EVERYDAY AT BEDTIME 01/29/24   [name], MD  primidone  (MYSOLINE ) 50 MG tablet Take 1 tablet (50 mg total) by mouth 2 (two) times daily. 06/11/23   Krishnan, Gokul, MD  tamsulosin  (FLOMAX ) 0.4 MG CAPS capsule TAKE 1 CAPSULE BY MOUTH EVERY DAY 07/23/23   [name], MD  traZODone  (DESYREL ) 100 MG tablet Take 1 tablet (100 mg total) by mouth at bedtime. 07/19/23   Rai, Nydia POUR, MD  venlafaxine  XR (EFFEXOR -XR) 150 MG 24 hr capsule Take 150 mg by mouth daily. 10/04/23   [provider]  venlafaxine  XR (EFFEXOR -XR) 75 MG 24 hr capsule Take 75 mg by mouth daily. 11/11/23   [provider]    Physical Exam: Vitals:   02/05/24 1256 02/05/24 1330 02/05/24 1400 02/05/24 1600  BP: 125/82 120/64 104/71 113/70  Pulse: 85 91 92 98  Resp: (!) 27 (!) 28 20 (!) 26  Temp: 98.1 F (36.7 C)   97.8 F (36.6 C)  TempSrc: Oral   Oral  SpO2: 96% 100% 100% 100%   Physical Exam Constitutional:      General: He is not in acute distress.    Appearance: He is obese.  HENT:     Head: Normocephalic and atraumatic.     Nose: Nose normal.     Mouth/Throat:     Mouth: Mucous membranes are dry.  Eyes:     Pupils: Pupils are equal, round, and reactive to light.  Cardiovascular:     Rate and Rhythm: Normal rate and regular rhythm.     Heart sounds: No murmur heard. Pulmonary:     Effort: Pulmonary effort is normal. No respiratory distress.     Breath sounds: Normal breath sounds. No wheezing or rales.  Abdominal:     General: Abdomen is flat.     Palpations: Abdomen is soft.     Comments: Ileostomy is present on the right side with significant output, Foley catheter in place with cloudy urine  Genitourinary:    Comments: Foley catheter in place Musculoskeletal:        General: Normal range of motion.     Cervical back: Normal range of motion and neck supple.  Skin:    General: Skin is warm and dry.  Neurological:     General: No focal deficit present.     Mental Status: He is alert. Mental status is at baseline.     Cranial Nerves: No cranial nerve deficit.     Sensory: No sensory deficit.  Psychiatric:        Mood and Affect: Mood normal.     Data Reviewed:  I personally reviewed patient's vital signs, labs including CBC, CMP, lactate, chest x-ray.  Assessment and Plan: No notes have been filed under this hospital service. Service: Hospitalist  #1.  Acute kidney injury superimposed on CKD 3A.  Patient's BUN and creatinine higher than his baseline.   Likely related to dehydration from increased ileostomy output.  Patient apparently was discharged on Imodium  and FiberCon, unsure if he has been taking those or not.  Also cannot rule out infectious etiology for increased ileostomy output. Patient has been ordered IV fluid boluses in the ED, will start on maintenance fluids afterwards with normal saline at 75 cc/h.  However may need to adjust based upon patient's labs.  Patient does have history of CHF with low EF, will need to monitor for any signs of volume overload. Hold losartan .  Serum potassium is 5, bicarb is 15. Will repeat labs in the morning.  #2.  Ileostomy with high output/recent sigmoid colectomy.  Patient apparently has not been able to take care of his ileostomy at home appropriately.  Will place a consult to wound care/ostomy.  Will continue on Imodium  and FiberCon.  If output remains high, consider stool studies.  Could also consider general surgery consultation. CT scan of the abdomen and pelvis without contrast has been ordered, results are pending at this time.  #3.  Pyuria/recurrent UTIs/? Sepsis.  Patient urinalysis suggests pyuria/bacteriuria, however he has history of chronic Foley catheter.  Recently had surgery for colovesical fistula.  Unsure if it is colonization, or a new infection.  He is somewhat tachycardic, however does not have leukocytosis, lactate is normal.  For the time being we will continue patient on antibiotics such as Zosyn , blood cultures have been sent, follow results.  Follow results of the CT abdomen and pelvis as well.  #4.  Chronic HFrEF.  Currently patient does not appear to have any signs of volume  overload, rather he appears somewhat dehydrated.  Continue with IV fluids.  Will hold losartan  due to AKI.  Decrease the dosage of Coreg  as blood pressure is somewhat soft.  Continue to monitor daily.I's and O's.  #5.  Anemia of chronic disease.  Continue to monitor hemoglobin.  Transfuse if needed.  #6.   Hyponatremia.  Patient does have some history of chronic hyponatremia, however could also be related to dehydration/GI fluid loss.  Repeat labs in the morning, and saline infusion.  #.  Bipolar disorder/anxiety depression/dementia.  Continue patient's home medications, however will hold gabapentin  due to AKI for now.  #8.  Generalized debility.  PT consulted.   Advance Care Planning:   Code Status: Full Code   Consults:    Severity of Illness: The appropriate patient status for this patient is INPATIENT. Inpatient status is judged to be reasonable and necessary in order to provide the required intensity of service to ensure the patient's safety. The patient's presenting symptoms, physical exam findings, and initial radiographic and laboratory data in the context of their chronic comorbidities is felt to place them at high risk for further clinical deterioration. Furthermore, it is not anticipated that the patient will be medically stable for discharge from the hospital within 2 midnights of admission.   * I certify that at the point of admission it is my clinical judgment that the patient will require inpatient hospital care spanning beyond 2 midnights from the point of admission due to high intensity of service, high risk for further deterioration and high frequency of surveillance required.*  Author: Alexi Geibel A Shelena Castelluccio, MD 02/05/2024 4:46 PM  For on call review [email or web address].

## 2024-02-05 NOTE — Progress Notes (Signed)
   02/05/24 1859  Assess: MEWS Score  Temp (!) 101.4 F (38.6 C)  BP 118/69  MAP (mmHg) 85  Pulse Rate 100  Resp (!) 24  Level of Consciousness Alert  SpO2 94 %  O2 Device Room Air  Assess: MEWS Score  MEWS Temp 1  MEWS Systolic 0  MEWS Pulse 0  MEWS RR 1  MEWS LOC 0  MEWS Score 2  MEWS Score Color Yellow  Assess: if the MEWS score is Yellow or Red  Were vital signs accurate and taken at a resting state? Yes  Does the patient meet 2 or more of the SIRS criteria? Yes  Does the patient have a confirmed or suspected source of infection? Yes  MEWS guidelines implemented  Yes, yellow  Treat  MEWS Interventions Considered administering scheduled or prn medications/treatments as ordered  Take Vital Signs  Increase Vital Sign Frequency  Yellow: Q2hr x1, continue Q4hrs until patient remains green for 12hrs  Escalate  MEWS: Escalate Yellow: Discuss with charge nurse and consider notifying provider and/or RRT  Notify: Charge Nurse/RN  Name of Charge Nurse/RN Notified Margery Cary, RN  Provider Notification  Provider Name/Title Margaretmary Haley, MD  Date Provider Notified 02/05/24  Time Provider Notified 1900  Method of Notification Page  Notification Reason Other (Comment) (Elevated Temperature)  Provider response No new orders  Date of Provider Response 02/05/24  Time of Provider Response 1903  Assess: SIRS CRITERIA  SIRS Temperature  1  SIRS Respirations  1  SIRS Pulse 1  SIRS WBC 0  SIRS Score Sum  3   RN gave PRN Tylenol  for fever.

## 2024-02-05 NOTE — Sepsis Progress Note (Signed)
 Code Sepsis protocol being monitored by eLink.

## 2024-02-05 NOTE — Progress Notes (Signed)
 Care Guide Pharmacy Note  02/05/2024 Name: Calvin Escobar MRN: [id number] DOB: [birth year]  Referred By: [name], MD Reason for referral: Call Attempt #1 and Complex Care Management (Outreach to schedule referral with pharmacist )   Calvin Escobar is a 73 y.o. year old male who is a primary care patient of [name], MD.  Calvin Escobar was referred to the pharmacist for assistance related to: med management   An unsuccessful telephone outreach was attempted today to contact the patient who was referred to the pharmacy team for assistance with medication management. Additional attempts will be made to contact the patient.  Thedford Franks, CMA [place]  [facility], [facility] Guide Direct Dial: [phone]  Fax: [phone] Website: [email or web address]

## 2024-02-05 NOTE — ED Provider Notes (Signed)
 [place] EMERGENCY DEPARTMENT AT [facility] Provider Note   CSN: [id number] Arrival date & time: 02/05/24  1200     Patient presents with: Weakness   Rolly Magri is a 73 y.o. male.   Pt with c/o generalized weakness, urine very cloudy/odorous, skin excoriation around ostomy. Per report, was sent home from SNF in past week, and home health care giver felt not adequately cared for at home. Ostomy bag was off, urinary catheter dragging behind patient on ground as he walked, erythema to abdominal wall, not eating, decreased appetite, and generally weak. Pt poor historian - level 5 caveat. Pt denies pain or trouble breathing. No known fevers. No trauma/fall.   The history is provided by the patient, the EMS personnel and medical records. The history is limited by the condition of the patient.       Prior to Admission medications   Medication Sig Start Date End Date Taking? Authorizing Provider  ARIPiprazole  (ABILIFY ) 2 MG tablet Take 1 tablet (2 mg total) by mouth in the morning. 08/22/23   [name], MD  busPIRone  (BUSPAR ) 10 MG tablet Take 1 tablet (10 mg total) by mouth 3 (three) times daily. 09/16/23   [name], MD  carvedilol  (COREG ) 12.5 MG tablet TAKE 1 TABLET (12.5MG  TOTAL) BY MOUTH TWICE A DAY WITH MEALS 01/29/24   [name], MD  ferrous sulfate  325 (65 FE) MG tablet Take 1 tablet (325 mg total) by mouth 2 (two) times daily with a meal. 01/14/24   Sheldon Standing, MD  finasteride  (PROSCAR ) 5 MG tablet Take 5 mg by mouth daily. 11/06/23   [provider]  folic acid  (FOLVITE ) 1 MG tablet Take 1 mg by mouth daily. 10/21/23   [provider]  gabapentin  (NEURONTIN ) 300 MG capsule Take 300 mg by mouth at bedtime. 01/01/24   [provider]  HYDROcodone -acetaminophen  (NORCO/VICODIN) 5-325 MG tablet Take 1-2 tablets by mouth every 6 (six) hours as needed for moderate pain (pain score 4-6) or severe pain (pain score 7-10). 01/14/24    Edmundo Marjorie Lapine, PA-C  loperamide  (IMODIUM ) 2 MG capsule Take 1 capsule (2 mg total) by mouth 2 (two) times daily. 01/14/24   Sheldon Standing, MD  loperamide  (IMODIUM ) 2 MG capsule Take 1-2 capsules (2-4 mg total) by mouth every 6 (six) hours as needed for diarrhea or loose stools (Use if >2 BM every 8 hours). 01/14/24   Sheldon Standing, MD  losartan  (COZAAR ) 50 MG tablet TAKE 1 TABLET (50 MG TOTAL) BY MOUTH DAILY. REPLACES ENTRESTO , START AFTER DONE WITH ENTRESTO  01/29/24   [name], MD  melatonin 3 MG TABS tablet Take 1 tablet (3 mg total) by mouth at bedtime as needed (insomnia). 07/19/23   Rai, Nydia POUR, MD  mirtazapine  (REMERON ) 30 MG tablet Take 1 tablet (30 mg total) by mouth at bedtime. 09/16/23   [name], MD  ondansetron  (ZOFRAN ) 4 MG tablet Take 4 mg by mouth every 8 (eight) hours as needed for nausea. 01/07/24   [provider]  polycarbophil (FIBERCON) 625 MG tablet Take 1 tablet (625 mg total) by mouth 2 (two) times daily. 01/14/24   Sheldon Standing, MD  pravastatin  (PRAVACHOL ) 20 MG tablet TAKE 1 TABLET BY MOUTH EVERYDAY AT BEDTIME 01/29/24   [name], MD  primidone  (MYSOLINE ) 50 MG tablet Take 1 tablet (50 mg total) by mouth 2 (two) times daily. 06/11/23   Krishnan, Gokul, MD  tamsulosin  (FLOMAX ) 0.4 MG CAPS  capsule TAKE 1 CAPSULE BY MOUTH EVERY DAY 07/23/23   [name], MD  traZODone  (DESYREL ) 100 MG tablet Take 1 tablet (100 mg total) by mouth at bedtime. 07/19/23   Rai, Nydia POUR, MD  venlafaxine  XR (EFFEXOR -XR) 150 MG 24 hr capsule Take 150 mg by mouth daily. 10/04/23   [provider]  venlafaxine  XR (EFFEXOR -XR) 75 MG 24 hr capsule Take 75 mg by mouth daily. 11/11/23   [provider]    Allergies: Albuterol     Review of Systems  Constitutional:  Positive for appetite change. Negative for fever.  HENT:  Negative for sore throat.   Respiratory:  Negative for cough and shortness of breath.   Cardiovascular:  Negative for chest pain.   Gastrointestinal:  Positive for nausea. Negative for abdominal pain and vomiting.  Genitourinary:  Negative for flank pain.  Musculoskeletal:  Negative for back pain and neck pain.  Neurological:  Negative for headaches.    Updated Vital Signs BP 104/71   Pulse 92   Temp 98.1 F (36.7 C) (Oral)   Resp 20   SpO2 100%   Physical Exam Vitals and nursing note reviewed.  Constitutional:      Appearance: Normal appearance. He is well-developed.  HENT:     Head: Atraumatic.     Nose: Nose normal.     Mouth/Throat:     Mouth: Mucous membranes are moist.     Pharynx: Oropharynx is clear.  Eyes:     General: No scleral icterus.    Conjunctiva/sclera: Conjunctivae normal.     Pupils: Pupils are equal, round, and reactive to light.  Neck:     Trachea: No tracheal deviation.     Comments: Trachea midline, thyroid  not grossly enlarged or tender. No neck stiffness or rigidity.  Cardiovascular:     Rate and Rhythm: Normal rate and regular rhythm.     Pulses: Normal pulses.     Heart sounds: Normal heart sounds. No murmur heard.    No friction rub. No gallop.  Pulmonary:     Effort: Pulmonary effort is normal. No accessory muscle usage or respiratory distress.     Breath sounds: Normal breath sounds.  Abdominal:     General: Bowel sounds are normal. There is no distension.     Palpations: Abdomen is soft.     Tenderness: There is no abdominal tenderness. There is no guarding.     Comments: Ostomy bag now attached (EMS indicates no ostomy bag on when they got there), there is moist, excoriated, erythematous skin of anterior abd wall and surrounding ostomy site. No crepitus.   Genitourinary:    Comments: No cva tenderness. Chronic indwelling foley - leg bag with very cloudy, almost milky chocolate milk appearing.  Musculoskeletal:        General: No swelling.     Cervical back: Normal range of motion and neck supple. No rigidity.  Skin:    General: Skin is warm and dry.      Findings: No rash.  Neurological:     Mental Status: He is alert.     Comments: Alert, speech clear. Motor/sens grossly intact bil.   Psychiatric:        Mood and Affect: Mood normal.     (all labs ordered are listed, but only abnormal results are displayed) Results for orders placed or performed during the hospital encounter of 02/05/24  Urinalysis, w/ Reflex to Culture (Infection Suspected) -Urine, Catheterized; Indwelling urinary catheter   Collection Time: 02/05/24  12:53 PM  Result Value Ref Range   Specimen Source URINE, CATHETERIZED    Color, Urine YELLOW YELLOW   APPearance TURBID (A) CLEAR   Specific Gravity, Urine 1.025 1.005 - 1.030   pH 6.0 5.0 - 8.0   Glucose, UA NEGATIVE NEGATIVE mg/dL   Hgb urine dipstick LARGE (A) NEGATIVE   Bilirubin Urine MODERATE (A) NEGATIVE   Ketones, ur NEGATIVE NEGATIVE mg/dL   Protein, ur >699 (A) NEGATIVE mg/dL   Nitrite POSITIVE (A) NEGATIVE   Leukocytes,Ua LARGE (A) NEGATIVE   RBC / HPF >50 0 - 5 RBC/hpf   WBC, UA >50 0 - 5 WBC/hpf   Bacteria, UA MANY (A) NONE SEEN   Squamous Epithelial / HPF 0-5 0 - 5 /HPF   WBC Clumps PRESENT   I-Stat Lactic Acid, ED   Collection Time: 02/05/24  1:25 PM  Result Value Ref Range   Lactic Acid, Venous 1.4 0.5 - 1.9 mmol/L  Comprehensive metabolic panel   Collection Time: 02/05/24  1:26 PM  Result Value Ref Range   Sodium 129 (L) 135 - 145 mmol/L   Potassium 5.0 3.5 - 5.1 mmol/L   Chloride 98 98 - 111 mmol/L   CO2 15 (L) 22 - 32 mmol/L   Glucose, Bld 132 (H) 70 - 99 mg/dL   BUN 52 (H) 8 - 23 mg/dL   Creatinine, Ser 6.83 (H) 0.61 - 1.24 mg/dL   Calcium  9.1 8.9 - 10.3 mg/dL   Total Protein 7.4 6.5 - 8.1 g/dL   Albumin  3.6 3.5 - 5.0 g/dL   AST 23 15 - 41 U/L   ALT 10 0 - 44 U/L   Alkaline Phosphatase 84 38 - 126 U/L   Total Bilirubin 0.2 0.0 - 1.2 mg/dL   GFR, Estimated 20 (L) >60 mL/min   Anion gap 16 (H) 5 - 15  CBC with Differential   Collection Time: 02/05/24  1:26 PM  Result Value Ref  Range   WBC 8.8 4.0 - 10.5 K/uL   RBC 3.38 (L) 4.22 - 5.81 MIL/uL   Hemoglobin 8.9 (L) 13.0 - 17.0 g/dL   HCT 69.4 (L) 60.9 - 47.9 %   MCV 90.2 80.0 - 100.0 fL   MCH 26.3 26.0 - 34.0 pg   MCHC 29.2 (L) 30.0 - 36.0 g/dL   RDW 85.2 88.4 - 84.4 %   Platelets 136 (L) 150 - 400 K/uL   nRBC 0.0 0.0 - 0.2 %   Neutrophils Relative % 81 %   Neutro Abs 7.2 1.7 - 7.7 K/uL   Lymphocytes Relative 6 %   Lymphs Abs 0.5 (L) 0.7 - 4.0 K/uL   Monocytes Relative 11 %   Monocytes Absolute 1.0 0.1 - 1.0 K/uL   Eosinophils Relative 0 %   Eosinophils Absolute 0.0 0.0 - 0.5 K/uL   Basophils Relative 1 %   Basophils Absolute 0.1 0.0 - 0.1 K/uL   WBC Morphology MORPHOLOGY UNREMARKABLE    RBC Morphology MORPHOLOGY UNREMARKABLE    Smear Review Normal platelet morphology    Immature Granulocytes 1 %   Abs Immature Granulocytes 0.08 (H) 0.00 - 0.07 K/uL   DG Chest Port 1 View Result Date: 02/05/2024 CLINICAL DATA:  Sepsis. EXAM: PORTABLE CHEST 1 VIEW COMPARISON:  January 09, 2024. FINDINGS: The heart size and mediastinal contours are within normal limits. Calcified granuloma seen in right lower lobe. No acute pulmonary disease. Severe degenerative changes seen involving left glenohumeral joint. IMPRESSION: No active disease. Electronically Signed  By: Lynwood Landy Raddle M.D.   On: 02/05/2024 13:53   DG Chest Port 1 View Result Date: 01/09/2024 CLINICAL DATA:  Short of breath EXAM: PORTABLE CHEST 1 VIEW COMPARISON:  12/14/2023 FINDINGS: Single frontal view of the chest demonstrates right internal jugular catheter tip overlying superior vena cava. Stable cardiac silhouette. No airspace disease, effusion, or pneumothorax. No acute bony abnormalities. IMPRESSION: 1. No acute intrathoracic process. 2. Right internal jugular catheter as above. Electronically Signed   By: Ozell Daring M.D.   On: 01/09/2024 18:51     EKG: EKG Interpretation Date/Time:  Wednesday February 05 2024 12:52:08 EDT Ventricular Rate:  89 PR  Interval:  161 QRS Duration:  130 QT Interval:  345 QTC Calculation: 420 R Axis:   -53  Text Interpretation: Sinus rhythm Left bundle branch block Confirmed by Bernard Drivers ([id number]) on 02/05/2024 3:29:09 PM  Radiology: [facility] Chest Port 1 View Result Date: 02/05/2024 CLINICAL DATA:  Sepsis. EXAM: PORTABLE CHEST 1 VIEW COMPARISON:  January 09, 2024. FINDINGS: The heart size and mediastinal contours are within normal limits. Calcified granuloma seen in right lower lobe. No acute pulmonary disease. Severe degenerative changes seen involving left glenohumeral joint. IMPRESSION: No active disease. Electronically Signed   By: Lynwood Landy Raddle M.D.   On: 02/05/2024 13:53     Procedures   Medications Ordered in the ED  vancomycin  (VANCOCIN ) IVPB 1000 mg/200 mL premix (1,000 mg Intravenous New Bag/Given 02/05/24 1458)  lactated ringers  bolus 1,000 mL (has no administration in time range)  lactated ringers  bolus 1,000 mL (1,000 mLs Intravenous New Bag/Given 02/05/24 1336)  ceFEPIme  (MAXIPIME ) 2 g in sodium chloride  0.9 % 100 mL IVPB (2 g Intravenous New Bag/Given 02/05/24 1328)  metroNIDAZOLE  (FLAGYL ) IVPB 500 mg (500 mg Intravenous New Bag/Given 02/05/24 1355)                                    Medical Decision Making Problems Addressed: Acute UTI: acute illness or injury with systemic symptoms that poses a threat to life or bodily functions AKI (acute kidney injury) (HCC): acute illness or injury with systemic symptoms that poses a threat to life or bodily functions Chronic anemia: chronic illness or injury Colovesical fistula: chronic illness or injury    Details: S/p recent prior surgical repair Dehydration: acute illness or injury with systemic symptoms that poses a threat to life or bodily functions Failure to thrive in adult: chronic illness or injury with exacerbation, progression, or side effects of treatment that poses a threat to life or bodily functions Generalized weakness: acute illness or  injury with systemic symptoms Skin excoriation: acute illness or injury Thrombocytopenia (HCC): acute illness or injury  Amount and/or Complexity of Data Reviewed Independent Historian: EMS    Details: hx External Data Reviewed: labs and notes. Labs: ordered. Decision-making details documented in ED Course. Radiology: ordered and independent interpretation performed. Decision-making details documented in ED Course. ECG/medicine tests: ordered and independent interpretation performed. Decision-making details documented in ED Course. Discussion of management or test interpretation with external provider(s): medicine  Risk Prescription drug management. Decision regarding hospitalization.   Iv ns. Continuous pulse ox and cardiac monitoring. Labs ordered/sent. Imaging ordered.   Differential diagnosis includes uti, sepsis, cellulitis, etc. Dispo decision including potential need for admission considered - will get labs and imaging and reassess.   Reviewed nursing notes and prior charts for additional history. External reports reviewed. Additional history  from: EMS.  LR bolus. Cultures sent. Iv abx given. Code sepsis activation.   Cardiac monitor: sinus rhythm, rate 80.  Labs reviewed/interpreted by me - wbc 8, hgb 8.8 c/w prior. Aki on ckd. LR boluses. UA c/w uti, cx sent. Iv abx already given. Additional ivf boluses.   Xrays reviewed/interpreted by me - no pna.   Recheck abd soft nt.   Pt may benefit from ostomy/wound care rn eval during admission as persistent small leak from ostomy apparatus and excoriated skin abd wall.  Hospitalists consulted for admission.  CRITICAL CARE RE: code sepsis activation, acute uti w aki/dehydration.  Performed by: Dagon Budai E Mary Hockey Total critical care time: 45 minutes Critical care time was exclusive of separately billable procedures and treating other patients. Critical care was necessary to treat or prevent imminent or life-threatening  deterioration. Critical care was time spent personally by me on the following activities: development of treatment plan with patient and/or surrogate as well as nursing, discussions with consultants, evaluation of patient's response to treatment, examination of patient, obtaining history from patient or surrogate, ordering and performing treatments and interventions, ordering and review of laboratory studies, ordering and review of radiographic studies, pulse oximetry and re-evaluation of patient's condition.         Final diagnoses:  Generalized weakness  Acute UTI  Colovesical fistula  Skin excoriation  Failure to thrive in adult  AKI (acute kidney injury) (HCC)  Dehydration  Chronic anemia  Thrombocytopenia [facility])    ED Discharge Orders     None          Bernard Drivers, MD 02/05/24 1533

## 2024-02-05 NOTE — Telephone Encounter (Signed)
 Spoke with pt wife on the phone to let her know about Virtual appt tomorrow at 4 pm stated she had to call 911 this morning paramedic just arrived wife gave the paramedic the phone and he stated they just got there but believes he is septic. They will take him to [place] now.

## 2024-02-05 NOTE — Progress Notes (Signed)
 Rapid Response notified of sepsis protocol and she stated all bases have been cover and no further orders. She stated if there is any change to notify Rapid response again. Will monitor patient.

## 2024-02-05 NOTE — ED Notes (Signed)
 Code Sepsis ran behind due to patient condition. Patient covered in feces upon arrival, foley had to be addressed and colostomy grossly leaked.

## 2024-02-06 ENCOUNTER — Ambulatory Visit: Admitting: Internal Medicine

## 2024-02-06 DIAGNOSIS — N179 Acute kidney failure, unspecified: Secondary | ICD-10-CM | POA: Diagnosis not present

## 2024-02-06 DIAGNOSIS — N189 Chronic kidney disease, unspecified: Secondary | ICD-10-CM | POA: Diagnosis not present

## 2024-02-06 LAB — BLOOD CULTURE ID PANEL (REFLEXED) - BCID2

## 2024-02-06 LAB — COMPREHENSIVE METABOLIC PANEL WITH GFR
ALT: 10 U/L (ref 0–44)
AST: 21 U/L (ref 15–41)
Albumin: 3.7 g/dL (ref 3.5–5.0)
Alkaline Phosphatase: 153 U/L — ABNORMAL HIGH (ref 38–126)
Anion gap: 16 — ABNORMAL HIGH (ref 5–15)
BUN: 55 mg/dL — ABNORMAL HIGH (ref 8–23)
CO2: 14 mmol/L — ABNORMAL LOW (ref 22–32)
Calcium: 8.8 mg/dL — ABNORMAL LOW (ref 8.9–10.3)
Chloride: 101 mmol/L (ref 98–111)
Creatinine, Ser: 3.13 mg/dL — ABNORMAL HIGH (ref 0.61–1.24)
GFR, Estimated: 20 mL/min — ABNORMAL LOW (ref >60)
Glucose, Bld: 122 mg/dL — ABNORMAL HIGH (ref 70–99)
Potassium: 4.7 mmol/L (ref 3.5–5.1)
Sodium: 131 mmol/L — ABNORMAL LOW (ref 135–145)
Total Bilirubin: 0.3 mg/dL (ref 0.0–1.2)
Total Protein: 6.8 g/dL (ref 6.5–8.1)

## 2024-02-06 LAB — PROTIME-INR
INR: 1.2 (ref 0.8–1.2)
Prothrombin Time: 15.7 s — ABNORMAL HIGH (ref 11.4–15.2)

## 2024-02-06 LAB — CBC
HCT: 27.7 % — ABNORMAL LOW (ref 39.0–52.0)
Hemoglobin: 8.2 g/dL — ABNORMAL LOW (ref 13.0–17.0)
MCH: 26.5 pg (ref 26.0–34.0)
MCHC: 29.6 g/dL — ABNORMAL LOW (ref 30.0–36.0)
MCV: 89.6 fL (ref 80.0–100.0)
Platelets: 141 K/uL — ABNORMAL LOW (ref 150–400)
RBC: 3.09 MIL/uL — ABNORMAL LOW (ref 4.22–5.81)
RDW: 14.8 % (ref 11.5–15.5)
WBC: 7.5 K/uL (ref 4.0–10.5)
nRBC: 0 % (ref 0.0–0.2)

## 2024-02-06 MED ORDER — METRONIDAZOLE 500 MG/100ML IV SOLN
500.0000 mg | Freq: Two times a day (BID) | INTRAVENOUS | Status: DC
  Administered 2024-02-06 – 2024-02-07 (×4): 500 mg via INTRAVENOUS
  Filled 2024-02-06 (×4): qty 100

## 2024-02-06 MED ORDER — LOPERAMIDE HCL 2 MG PO CAPS
2.0000 mg | ORAL_CAPSULE | Freq: Two times a day (BID) | ORAL | Status: DC
  Administered 2024-02-06 – 2024-02-08 (×6): 2 mg via ORAL
  Filled 2024-02-06 (×6): qty 1

## 2024-02-06 MED ORDER — SODIUM CHLORIDE 0.9 % IV SOLN: INTRAVENOUS | Status: DC

## 2024-02-06 MED ORDER — HEPARIN SODIUM (PORCINE) 5000 UNIT/ML IJ SOLN
5000.0000 [IU] | Freq: Three times a day (TID) | INTRAMUSCULAR | Status: DC
  Administered 2024-02-06 – 2024-02-09 (×8): 5000 [IU] via SUBCUTANEOUS
  Filled 2024-02-06 (×8): qty 1

## 2024-02-06 MED ORDER — SODIUM CHLORIDE 0.9 % IV SOLN
2.0000 g | INTRAVENOUS | Status: DC
  Administered 2024-02-06 – 2024-02-08 (×3): 2 g via INTRAVENOUS
  Filled 2024-02-06 (×3): qty 20

## 2024-02-06 NOTE — Progress Notes (Signed)
 Call back received from NP [facility] and ordered to remove the catheter and replace with an 18 Fr Coude.

## 2024-02-06 NOTE — Progress Notes (Signed)
 Around 12 midnight DR Redia contacted this nurse via secure chat that he was able to see the results of the CT scan from abdomen and pelvis and saw that the catheter balloon is misposition in the urethra and to contact the oncall hospitalist. Contacted Lavanda Horns and she instructed this nurse to try to advance the catheter.Two attempt were made unsuccesfully. Foley bag was emptied with 250 mls followed by a bladder scan showing 297 mls. After the 2 attempts no urine was draining into the foley bag attempting to advance for a third time with no success. A second bladder scan was performed showing 390 mls. NP Horns was notified and order to hold the fluids and to contact [facility] or the page extender for further recommendations. Waiting for call back form page extender.

## 2024-02-06 NOTE — Progress Notes (Signed)
 PHARMACY - PHYSICIAN COMMUNICATION CRITICAL VALUE ALERT - BLOOD CULTURE IDENTIFICATION (BCID)  Calvin Escobar is an 73 y.o. male who presented to [facility] on 02/05/2024 with a chief complaint of weakness  Assessment:  BCID: 1 (aerobic bottle) out of 4 total - kleb pneumo, no resis   Name of physician (or Provider) Contacted: A. Adhikari via secure chat  Current antibiotics: zosyn   Changes to prescribed antibiotics recommended:  Change to ceftriaxone  2 gm IV q24 and metronidazole  500 mg IV q12hrs  Results for orders placed or performed during the hospital encounter of 02/05/24  Blood Culture ID Panel (Reflexed) (Collected: 02/05/2024  1:26 PM)  Result Value Ref Range   Enterococcus faecalis NOT DETECTED NOT DETECTED   Enterococcus Faecium NOT DETECTED NOT DETECTED   Listeria monocytogenes NOT DETECTED NOT DETECTED   Staphylococcus species NOT DETECTED NOT DETECTED   Staphylococcus aureus (BCID) NOT DETECTED NOT DETECTED   Staphylococcus epidermidis NOT DETECTED NOT DETECTED   Staphylococcus lugdunensis NOT DETECTED NOT DETECTED   Streptococcus species NOT DETECTED NOT DETECTED   Streptococcus agalactiae NOT DETECTED NOT DETECTED   Streptococcus pneumoniae NOT DETECTED NOT DETECTED   Streptococcus pyogenes NOT DETECTED NOT DETECTED   A.calcoaceticus-baumannii NOT DETECTED NOT DETECTED   Bacteroides fragilis NOT DETECTED NOT DETECTED   Enterobacterales DETECTED (A) NOT DETECTED   Enterobacter cloacae complex NOT DETECTED NOT DETECTED   Escherichia coli NOT DETECTED NOT DETECTED   Klebsiella aerogenes NOT DETECTED NOT DETECTED   Klebsiella oxytoca NOT DETECTED NOT DETECTED   Klebsiella pneumoniae DETECTED (A) NOT DETECTED   Proteus species NOT DETECTED NOT DETECTED   Salmonella species NOT DETECTED NOT DETECTED   Serratia marcescens NOT DETECTED NOT DETECTED   Haemophilus influenzae NOT DETECTED NOT DETECTED   Neisseria meningitidis NOT DETECTED NOT DETECTED   Pseudomonas  aeruginosa NOT DETECTED NOT DETECTED   Stenotrophomonas maltophilia NOT DETECTED NOT DETECTED   Candida albicans NOT DETECTED NOT DETECTED   Candida auris NOT DETECTED NOT DETECTED   Candida glabrata NOT DETECTED NOT DETECTED   Candida krusei NOT DETECTED NOT DETECTED   Candida parapsilosis NOT DETECTED NOT DETECTED   Candida tropicalis NOT DETECTED NOT DETECTED   Cryptococcus neoformans/gattii NOT DETECTED NOT DETECTED   CTX-M ESBL NOT DETECTED NOT DETECTED   Carbapenem resistance IMP NOT DETECTED NOT DETECTED   Carbapenem resistance KPC NOT DETECTED NOT DETECTED   Carbapenem resistance NDM NOT DETECTED NOT DETECTED   Carbapenem resist OXA 48 LIKE NOT DETECTED NOT DETECTED   Carbapenem resistance VIM NOT DETECTED NOT DETECTED     Rosaline IVAR Edison, Pharm.D Use secure chat for questions 02/06/2024 8:45 AM

## 2024-02-06 NOTE — TOC Initial Note (Addendum)
 Transition of Care [facility]) - Initial/Assessment Note    Patient Details  Name: Calvin Escobar MRN: [id number] Date of Birth: 04-11-1951  [facility]) CM/SW Contact:    Bascom Service, RN Phone Number: 02/06/2024, 11:01 AM  Clinical Narrative:Dementia. Has Liver Ca,ileostomy;sacral, & R foot wound-WOC following, f/c.Spoke to spouse([name]) about d/c plans. Informed [name] that LTC is not service we start in the hospital-recc Dept social service.Active w/[employer] rep Arna confirmed HHRN/PT/OT. Await PT recc.  May need PTAR @ d/c.                 Expected Discharge Plan: Skilled Nursing Facility Barriers to Discharge: Continued Medical Work up   Patient Goals and CMS Choice Patient states their goals for this hospitalization and ongoing recovery are:: Home CMS Medicare.gov Compare Post Acute Care list provided to:: Patient Represenative (must comment) ([name](spouse)) Choice offered to / list presented to : Spouse [place] ownership interest in [facility].provided to:: Spouse    Expected Discharge Plan and Services   Discharge Planning Services: CM Consult Post Acute Care Choice: Skilled Nursing Facility Living arrangements for the past 2 months: Single Family Home                                      Prior Living Arrangements/Services Living arrangements for the past 2 months: Single Family Home Lives with:: Spouse   Do you feel safe going back to the place where you live?: Yes          Current home services: DME, Home PT, Home RN, Home OT (rw;Active w/[employer] HHRN/PT/OT)    Activities of Daily Living   ADL Screening (condition at time of admission) Independently performs ADLs?: No Does the patient have a NEW difficulty with bathing/dressing/toileting/self-feeding that is expected to last >3 days?: No Does the patient have a NEW difficulty with getting in/out of bed, walking, or climbing stairs that is expected to last >3 days?:  No Does the patient have a NEW difficulty with communication that is expected to last >3 days?: No Is the patient deaf or have difficulty hearing?: Yes Does the patient have difficulty seeing, even when wearing glasses/contacts?: No Does the patient have difficulty concentrating, remembering, or making decisions?: No  Permission Sought/Granted Permission sought to share information with : Case Manager Permission granted to share information with : Yes, Verbal Permission Granted              Emotional Assessment              Admission diagnosis:  Dehydration [E86.0] Thrombocytopenia (HCC) [D69.6] 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] Acute UTI [N39.0] Failure to thrive in adult [R62.7] Chronic anemia [D64.9] Generalized weakness [R53.1] AKI (acute kidney injury) (HCC) [N17.9] Acute kidney injury (HCC) [N17.9] Skin excoriation [T14.8XXA] Patient Active Problem List   Diagnosis Date Noted   Acute kidney injury (HCC) 02/05/2024   Malnutrition of moderate degree 01/14/2024   OSA on CPAP 01/14/2024   Diverticulitis of sigmoid colon s/p robotic colectomy 01/09/2024 01/14/2024   Ileostomy in place [facility]) 01/11/2024   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   History of COVID-19 01/09/2024   History of urinary retention 01/09/2024   Chronic indwelling Foley catheter 01/09/2024   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   Chronic HFrEF (heart failure with reduced ejection fraction) (HCC) 12/13/2023   Hematuria 12/02/2023   Medication management 11/05/2023   Hydronephrosis of left kidney  09/28/2023   Prostatic mass 09/28/2023   Weight loss, abnormal 08/14/2023   Hypocalcemia 08/14/2023   Hypoalbuminemia 08/14/2023   GERD (gastroesophageal reflux disease) 07/13/2023   Recurrent UTI 07/01/2023   LBBB (left bundle branch block) 06/02/2023   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   Acute kidney injury superimposed on chronic kidney disease (HCC) 05/13/2023   Obesity (BMI 30-39.9) 05/13/2023    Tremor 05/08/2023   Lack of appetite 05/08/2023   Chronic kidney disease, stage 3a (HCC) 03/07/2023   Anemia of chronic disease 03/07/2023   Limp 12/19/2022   Gynecomastia, male 12/19/2022   Chronic back pain 12/19/2022   Hypertension 11/21/2022   Generalized arthritis 07/18/2022   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   DCM (dilated cardiomyopathy) (HCC)    OSA treated with BiPAP    Primary insomnia 08/05/2017   Dementia (HCC)    HLD (hyperlipidemia)    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 [place] [address] Phone: [phone] Fax: [phone]  [id number] - [place], [place] - [address] [place] [address] Phone: [phone] Fax: [phone]  [facility] [address] [place] [address] Phone: [phone] Fax: [phone]  [facility] [address] [place] [address] Phone: [phone] Fax: [phone]     Social Drivers of Health (SDOH) Social History: SDOH Screenings   Food Insecurity: No Food Insecurity (02/05/2024)  Housing: Low Risk  (02/05/2024)  Transportation Needs: No Transportation Needs (02/05/2024)  Utilities: Not At Risk (02/05/2024)  Alcohol Screen: Low Risk  (10/10/2023)  Depression (PHQ2-9): Low Risk  (12/09/2023)  Recent Concern: Depression (PHQ2-9) - Medium Risk (09/16/2023)  Financial Resource Strain: Low Risk  (10/10/2023)  Physical Activity: Sufficiently Active (10/10/2023)  Social Connections: Moderately Integrated (02/05/2024)  Recent Concern: Social Connections - Socially Isolated (12/11/2023)  Stress: No Stress Concern Present (10/10/2023)   Tobacco Use: Low Risk  (02/05/2024)  Health Literacy: Adequate Health Literacy (10/10/2023)   SDOH Interventions:     Readmission Risk Interventions    01/14/2024    2:09 PM 07/04/2023    1:27 PM 07/03/2023    2:48 PM  Readmission Risk Prevention Plan  Transportation Screening  Complete Complete  PCP or Specialist Appt within 5-7 Days  Complete Complete  Home Care Screening  Complete Complete  Medication Review (RN CM)  Complete Complete  Medication Review (RN Care Manager) Complete    PCP or Specialist appointment within 3-5 days of discharge Complete    HRI or Home Care Consult Complete    SW Recovery Care/Counseling Consult Complete    Palliative Care Screening Not Applicable    Skilled Nursing Facility Complete

## 2024-02-06 NOTE — Evaluation (Signed)
 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  Clinical Impression  Pt admitted with above diagnosis.  Pt currently with functional limitations due to the deficits listed below (see PT Problem List). Pt Calvin benefit from acute skilled PT to increase their independence and safety with mobility to allow discharge.  Pt poor historian, hx of dementia, but pleasantly confused.  Pt currently at least min assist for mobilizing and ambulated short distance in hallway.  Pt reports living with his son however per notes, lives with spouse.  If min assist is not available upon d/c at home then, patient would benefit from continued inpatient follow up therapy, <3 hours/day.          If plan is discharge home, recommend the following: A little help with walking and/or transfers;A little help with bathing/dressing/bathroom;Assistance with cooking/housework;Assist for transportation;Help with stairs or ramp for entrance;Supervision due to cognitive status   Can travel by private vehicle        Equipment Recommendations Rolling walker (2 wheels)  Recommendations for Other Services       Functional Status Assessment Patient has had a recent decline in their functional status and demonstrates the ability to make significant  improvements in function in a reasonable and predictable amount of time.     Precautions / Restrictions Precautions Precautions: Fall Precaution/Restrictions Comments: ileostomy      Mobility  Bed Mobility Overal bed mobility: Needs Assistance Bed Mobility: Supine to Sit, Sit to Supine     Supine to sit: Min assist, HOB elevated Sit to supine: Contact guard assist, HOB elevated   General bed mobility comments: assist for trunk upright    Transfers Overall transfer level: Needs assistance Equipment used: 1 person hand held assist Transfers: Sit to/from Stand Sit to Stand: Min assist           General transfer comment: assist to rise and stabilize    Ambulation/Gait Ambulation/Gait assistance: Min assist Gait Distance (Feet): 80 Feet Assistive device: IV Pole, 1 person hand held assist Gait Pattern/deviations: Decreased stride length, Step-through pattern Gait velocity: decr     General Gait Details: pt declined needing RW however reliant on bil UE support upon standing, pt provided with HHA and then pushed IV pole with other UE, would benefit from RW next visit, distance to tolerance  Stairs            Wheelchair Mobility     Tilt Bed    Modified Rankin (Stroke Patients Only)       Balance Overall balance assessment: Needs assistance         Standing balance support: Bilateral upper extremity supported, During functional activity Standing balance-Leahy Scale: Poor                               Pertinent Vitals/Pain Pain Assessment Pain Assessment: No/denies pain    Home Living Family/patient expects to be discharged to:: Private  residence Living Arrangements: Spouse/significant other;Children Available Help at Discharge: Family Type of Home: House Home Access: Stairs to enter Entrance Stairs-Rails: None Entrance Stairs-Number of Steps: 8-9 Alternate Level Stairs-Number of Steps: flight Home Layout: Two level Home  Equipment: Agricultural consultant (2 wheels) Additional Comments: above from previous admission, pt reports flight to bedroom and living with his son (states son works at [employer])    Prior Function Prior Level of Function : Patient poor historian/Family not available;Independent/Modified Independent             Mobility Comments: appears ambulatory; had been at SNF and recently returned home prior to this admission however       Extremity/Trunk Assessment        Lower Extremity Assessment Lower Extremity Assessment: Generalized weakness    Cervical / Trunk Assessment Cervical / Trunk Assessment: Kyphotic  Communication   Communication Communication: No apparent difficulties    Cognition Arousal: Alert Behavior During Therapy: Flat affect   PT - Cognitive impairments: History of cognitive impairments                       PT - Cognition Comments: hx dementia, appears pleasantly confused Following commands: Intact       Cueing       General Comments      Exercises     Assessment/Plan    PT Assessment Patient needs continued PT services  PT Problem List Decreased activity tolerance;Decreased strength;Decreased mobility;Decreased knowledge of use of DME;Decreased knowledge of precautions;Decreased balance;Decreased cognition;Decreased safety awareness       PT Treatment Interventions DME instruction;Gait training;Balance training;Therapeutic activities;Functional mobility training;Therapeutic exercise;Patient/family education;Stair training    PT Goals (Current goals can be found in the Care Plan section)  Acute Rehab PT Goals PT Goal Formulation: With patient Time For Goal Achievement: 02/20/24 Potential to Achieve Goals: Good    Frequency Min 3X/week     Co-evaluation               AM-PAC PT 6 Clicks Mobility  Outcome Measure Help needed turning from your back to your side while in a flat bed without using bedrails?: A Little Help  needed moving from lying on your back to sitting on the side of a flat bed without using bedrails?: A Little Help needed moving to and from a bed to a chair (including a wheelchair)?: A Little Help needed standing up from a chair using your arms (e.g., wheelchair or bedside chair)?: A Little Help needed to walk in hospital room?: A Little Help needed climbing 3-5 steps with a railing? : A Lot 6 Click Score: 17    End of Session Equipment Utilized During Treatment: Gait belt Activity Tolerance: Patient tolerated treatment well Patient left: in bed;with call bell/phone within reach;with bed alarm set Nurse Communication: Mobility status PT Visit Diagnosis: Difficulty in walking, not elsewhere classified (R26.2)    Time: 1020-1035 PT Time Calculation (min) (ACUTE ONLY): 15 min   Charges:   PT Evaluation $PT Eval Low Complexity: 1 Low   PT General Charges $$ ACUTE PT VISIT: 1 Visit        Tari PT, DPT Physical Therapist Acute Rehabilitation Services Office: [phone]   Kati L Payson 02/06/2024, 1:16 PM

## 2024-02-06 NOTE — Evaluation (Signed)
 Occupational Therapy Evaluation Patient Details Name: Calvin Escobar MRN: [id number] DOB: [birth year] Today's Date: 02/06/2024   History of Present Illness   Pt is a 73 year old male brought to the hospital with weakness. He has had a recent hospitalization for colovesical fistula/sigmoid diverticulitis status post robotic rectosigmoid resection with anastomosis, colovesical fistula takedown and repair, diverting ileostomy, drainage of pelvic abscess and lysis of adhesions and was discharged to nursing facility on 8/12. 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     Clinical Impressions The pt is currently presenting with the below listed deficits (see OT problem list). As such, his occupational performance is compromised and he requires assist for self-care management. During the session, he required min assist for supine to sit, to donn socks seated EOB, and to stand using a RW. He reported feeling quite a bit weaker than normal. He will benefit from OT services to maximize his independence with self-care tasks. Short-term SNF rehab vs. Home health therapy with family assist is recommended. If pt's family is unable to manage the pt's care in the home, there may need to be consideration for ALF as a longer term option, as the pt has had several hospital re-admissions.      If plan is discharge home, recommend the following:   A little help with walking and/or transfers;A little help with bathing/dressing/bathroom;Assistance with cooking/housework;Direct supervision/assist for medications management;Direct supervision/assist for financial management;Assist for transportation;Help with stairs or ramp for entrance     Functional Status Assessment   Patient has had a  recent decline in their functional status and demonstrates the ability to make significant improvements in function in a reasonable and predictable amount of time.     Equipment Recommendations   Other (comment) (defer to next level of care)     Recommendations for Other Services         Precautions/Restrictions   Precautions Precautions: Fall Precaution/Restrictions Comments: ileostomy Restrictions Weight Bearing Restrictions Per Provider Order: No     Mobility Bed Mobility Overal bed mobility: Needs Assistance       Supine to sit: Min assist, HOB elevated Sit to supine: Contact guard assist        Transfers Overall transfer level: Needs assistance Equipment used: Rolling walker (2 wheels) Transfers: Sit to/from Stand Sit to Stand: Min assist                  Balance     Sitting balance-Leahy Scale: Fair         Standing balance comment: CGA to min assist with RW         ADL either performed or assessed with clinical judgement   ADL Overall ADL's : Needs assistance/impaired Eating/Feeding: Set up;Sitting   Grooming: Set up;Supervision/safety;Sitting;Cueing for sequencing   Upper Body Bathing: Set up;Supervision/ safety;Sitting   Lower Body Bathing: Minimal assistance;Sitting/lateral leans   Upper Body Dressing : Minimal assistance;Sitting   Lower Body Dressing: Minimal assistance;Sitting/lateral leans       Toileting- Clothing Manipulation and Hygiene: Minimal assistance;Moderate assistance Toileting - Clothing Manipulation Details (indicate cue type and reason): at bathroom level, based on clinical judgement                          Pertinent Vitals/Pain Pain Assessment Pain Assessment: No/denies pain  Extremity/Trunk Assessment Upper Extremity Assessment Upper Extremity Assessment: RUE deficits/detail;LUE deficits/detail RUE Deficits / Details: Chronic appearing shoulder AROM limitations, though not severe.  Elbow and hand AROM WFL. Functional grip strength LUE Deficits / Details: Chronic appearing shoulder AROM limitations, though not severe. Elbow and hand AROM WFL. Functional grip strength   Lower Extremity Assessment Lower Extremity Assessment: Generalized weakness       Communication Communication Communication: No apparent difficulties   Cognition Arousal: Alert Behavior During Therapy: Flat affect Cognition: Cognition impaired   Orientation impairments: Place, Time, Situation Awareness: Online awareness impaired Memory impairment (select all impairments): Short-term memory, Declarative long-term memory, Working memory Attention impairment (select first level of impairment): Divided attention Executive functioning impairment (select all impairments): Problem solving, Reasoning            Following commands:  (Able to follow simple 1 step commands consistently)                  Home Living Family/patient expects to be discharged to:: Private residence Living Arrangements: Spouse/significant other;Children (spouse and son) Available Help at Discharge: Family Type of Home: House Home Access: Stairs to enter Secretary/administrator of Steps: 8 Entrance Stairs-Rails: None Home Layout: Two level Alternate Level Stairs-Number of Steps: flight      Home Equipment: Agricultural consultant (2 wheels)          Prior Functioning/Environment Prior Level of Function : Patient poor historian/Family not available;Independent/Modified Independent             Mobility Comments: Appears ambulatory; had been at [facility] rehab and recently returned home prior to this admission. ADLs Comments:  (The pt reported his spouse managed the cooking, cleaning, and driving.)    OT Problem List: Decreased strength;Impaired balance (sitting and/or standing);Decreased cognition;Decreased safety awareness;Decreased knowledge of use of DME or AE   OT Treatment/Interventions: Self-care/ADL  training;Therapeutic exercise;Therapeutic activities;Cognitive remediation/compensation;Energy conservation;DME and/or AE instruction;Patient/family education;Balance training      OT Goals(Current goals can be found in the care plan section)   Acute Rehab OT Goals Patient Stated Goal: to return home OT Goal Formulation: With patient Time For Goal Achievement: 02/20/24 Potential to Achieve Goals: Good ADL Goals Pt Will Perform Upper Body Dressing: with supervision;with set-up;sitting Pt Will Perform Lower Body Dressing: with supervision;with set-up;sit to/from stand;sitting/lateral leans Pt Will Transfer to Toilet: with supervision;ambulating Pt Will Perform Toileting - Clothing Manipulation and hygiene: with supervision;sit to/from stand   OT Frequency:  Min 2X/week       AM-PAC OT 6 Clicks Daily Activity     Outcome Measure Help from another person eating meals?: A Little Help from another person taking care of personal grooming?: A Little Help from another person toileting, which includes using toliet, bedpan, or urinal?: A Little Help from another person bathing (including washing, rinsing, drying)?: A Lot Help from another person to put on and taking off regular upper body clothing?: A Little Help from another person to put on and taking off regular lower body clothing?: A Little 6 Click Score: 17   End of Session Equipment Utilized During Treatment: Rolling walker (2 wheels) Nurse Communication: Mobility status  Activity Tolerance: Patient tolerated treatment well Patient left: in bed;with call bell/phone within reach;with bed alarm set  OT Visit Diagnosis: Unsteadiness on feet (R26.81);Other abnormalities of gait and mobility (R26.89);Muscle weakness (generalized) (M62.81);Other symptoms and signs involving cognitive function                Time: 8488-8472 OT Time Calculation (min):  16 min Charges:  OT General Charges $OT Visit: 1 Visit OT Evaluation $OT Eval  Moderate Complexity: 1 Mod    Delanna JINNY Lesches, OTR/L 02/06/2024, 5:12 PM

## 2024-02-06 NOTE — Progress Notes (Signed)
 A page was send for urology page extender to Ole Bourdon NP regarding this patient's foley catheter been on the urethra and unable to advance to bladder. Waiting for call back.

## 2024-02-06 NOTE — Consult Note (Addendum)
 WOC Nurse ostomy consult note Pt pre market and educated for ostomy care. Readmitted. Pouch was leaking. Stoma type/location: loop ileostomy RLQ Stomal assessment/size: 38 mm round, red and viable, on the skin level, os at 7 o'clock position. Peristomal assessment: PMASD all around the ostomy, partial thickness at 2 to 8 o'clock, redness. Treatment options for stomal/peristomal skin: Powder #6, ring 2 inch [id number]. Output No pouch when I visit him, bed nurse was change it. Ostomy pouching: 2pc. Soft convex [id number] and bag #[id number]. Education provided: Pt was drowsy, no family member. Enrolled patient in [employer] DC program: Yes previous.  Bed nurse instructions: - Cleanse the skin with saline and soft wash cloth or gauze, pat dry the peri-ostomy skin. - Cut the barrier as the ostomy size and shape. Addendum: the ostomy is on the skin level, use convex barrier and the ring to provide a good seal; - Around the cut on the barrier, applied the ring, stretching and modeling. - Take the plastic protection off, apply to his skin, take the drape protection off, adapting in his the abdomen. - Close the lock in roll system. - Empty when is 1/3 full or 1/2 full. - Change the pouch 2 times per week or if is leaking.  WOC team will follow weekly due the dermatitits.  Please reconsult if further assistance is needed. Thank-you,  Lela Holm RN, CNS, [employer], MSN.  (Phone [phone])

## 2024-02-06 NOTE — TOC Progression Note (Signed)
 Transition of Care [facility]) - Progression Note    Patient Details  Name: Calvin Escobar MRN: [id number] Date of Birth: February 23, 1951  Transition of Care [facility]) CM/SW Contact  Isamar Wellbrock, Nathanel, RN Phone Number: 02/06/2024, 2:12 PM  Clinical Narrative: PT recc [facility]-spouse ([name]) in agreement to fax out-has gone to [facility] in past-agree to go again if accepted. Pasrr  ends 02/12/24 [phone] E. Await bed offers.     Expected Discharge Plan: Skilled Nursing Facility Barriers to Discharge: Continued Medical Work up               Expected Discharge Plan and Services   Discharge Planning Services: CM Consult Post Acute Care Choice: Skilled Nursing Facility Living arrangements for the past 2 months: Single Family Home                                       Social Drivers of Health (SDOH) Interventions SDOH Screenings   Food Insecurity: No Food Insecurity (02/05/2024)  Housing: Low Risk  (02/05/2024)  Transportation Needs: No Transportation Needs (02/05/2024)  Utilities: Not At Risk (02/05/2024)  Alcohol Screen: Low Risk  (10/10/2023)  Depression (PHQ2-9): Low Risk  (12/09/2023)  Recent Concern: Depression (PHQ2-9) - Medium Risk (09/16/2023)  Financial Resource Strain: Low Risk  (10/10/2023)  Physical Activity: Sufficiently Active (10/10/2023)  Social Connections: Moderately Integrated (02/05/2024)  Recent Concern: Social Connections - Socially Isolated (12/11/2023)  Stress: No Stress Concern Present (10/10/2023)  Tobacco Use: Low Risk  (02/05/2024)  Health Literacy: Adequate Health Literacy (10/10/2023)    Readmission Risk Interventions    01/14/2024    2:09 PM 07/04/2023    1:27 PM 07/03/2023    2:48 PM  Readmission Risk Prevention Plan  Transportation Screening  Complete Complete  PCP or Specialist Appt within 5-7 Days  Complete Complete  Home Care Screening  Complete Complete  Medication Review (RN CM)  Complete Complete  Medication Review (RN Care Manager) Complete    PCP or  Specialist appointment within 3-5 days of discharge Complete    HRI or Home Care Consult Complete    SW Recovery Care/Counseling Consult Complete    Palliative Care Screening Not Applicable    Skilled Nursing Facility Complete

## 2024-02-06 NOTE — Progress Notes (Signed)
 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  Patient and his family had difficulty to apply the bag.  Also reported generalized weakness, decreased oral intake.  When EMS arrived to pick him up, his ileostomy bag was off.  Appeared to have cloudy urine in the Foley bag.  On presentation, he was slightly tachycardic with a stable blood pressure.  Lab work showed creatinine of 3.1, he has baseline creatinine ranges from 1.3-1.4.  UA suspicious for UTI.  Chest x-ray did not show any acute disease.  Started on antibiotics.  Blood cultures now showing gram-negative rods.  Assessment & Plan:  Principal Problem:   Acute kidney injury superimposed on chronic kidney disease (HCC) Active Problems:   OSA treated with BiPAP   Anemia of chronic disease   Chronic kidney disease, stage 3a (HCC)   Bipolar affective disorder, depressed (HCC)   BPH (benign prostatic hyperplasia)   Dementia (HCC)   Colovesical fistula   Hypertension   Recurrent UTI   Chronic HFrEF (heart failure with reduced ejection fraction) (HCC)   Chronic indwelling Foley catheter   Ileostomy in place [facility])   Diverticulitis of sigmoid colon s/p robotic colectomy 01/09/2024   Acute kidney injury (HCC)    Sepsis secondary to UTI/gram-negative bacteremia: History of recurrent UTI  with Pseudomonas, Enterococcus, Candida, E. coli. No fever or leukocytosis.  Developed fever of 101.4 on 02/05/2024.  UA suspicious for UTI with large leukocytes.  Blood culture showing Klebsiella.  Currently on ceftriaxone  and Flagyl ..  Urine culture not sent, we have ordered.    Continue current antibiotics, follow-up final culture and sensitivity.  Afebrile this morning  AKI on CKD stage IIIa: Baseline creatinine of 1.3-1.4.  Presented with creatinine in the range of 3.  Likely from dehydration from large output from ileostomy.  Continue until IV fluid.  Monitor renal function.  Avoid nephrotoxins.  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  Urology following here.  Chronic HFrEF: Appeared dehydrated on presentation.  Continue gentle IV fluid.  Losartan  held for AKI.  Dose of Coreg  decreased.  Continue to monitor daily output, input  Anemia of chronic disease: Currently hemoglobin stable.  Continue to monitor  Hyponatremia: History of chronic hyponatremia but could also be yesterday with dehydration, GI fluid loss.  Continue gentle IV fluids.  Improving  Bipolar disorder/anxiety/depression/dementia: Home gabapentin  on hold.  Continue delirium precaution , frequent reorientation.  Patient is intermittently confused.  Debility/weakness: PT/OT  consulted.  Recently discharged from facility to home.         DVT prophylaxis:heparin  injection 5,000 Units Start: 02/05/24 2200 Place TED hose Start: 02/05/24 1627     Code Status: Full Code  Family Communication: Called spouse,[name] twice  on 9/4 ,  directly goes to voicemail  Patient  status:Inpatient  Patient is from :Home  Anticipated discharge to:SNF  Estimated DC date:1-2 days   Consultants: Urology  Procedures:None  Antimicrobials:  Anti-infectives (From admission, onward)    Start     Dose/Rate Route Frequency Ordered Stop   02/05/24 2200  piperacillin -tazobactam (ZOSYN ) IVPB 3.375 g        3.375 g 12.5 mL/hr over 240 Minutes Intravenous Every 8 hours 02/05/24 1652     02/05/24 1230  ceFEPIme  (MAXIPIME ) 2 g in sodium chloride  0.9 % 100 mL IVPB        2 g 200 mL/hr over 30 Minutes Intravenous  Once 02/05/24 1216 02/05/24 1358   02/05/24 1230  metroNIDAZOLE  (FLAGYL ) IVPB 500 mg        500 mg 100 mL/hr over 60 Minutes Intravenous  Once 02/05/24 1216 02/05/24 1455   02/05/24 1230  vancomycin  (VANCOCIN ) IVPB 1000 mg/200 mL premix        1,000 mg 200 mL/hr over 60 Minutes Intravenous  Once 02/05/24 1216 02/05/24 1558       Subjective: Patient seen and examined at bedside today.  Hemodynamically stable this morning.  Afebrile during my evaluation.  Blood pressure stable.  Lying in bed.  Appears comfortable.  Denies any abdominal pain, nausea or vomiting.  Ileostomy has liquid stool.  Abdomen is soft and nondistended.  He is on room air.  He is oriented to place and knows that the month is September  Objective: Vitals:   02/05/24 2109 02/06/24 0110 02/06/24 0500 02/06/24 0516  BP: 113/66 (!) 153/83  118/75  Pulse: 92 82  (!) 109  Resp: 16 18  20   Temp: 98.8 F (37.1 C) 98 F (36.7 C)  98 F (36.7 C)  TempSrc: Oral Oral  Oral  SpO2: 98% 95%  97%  Weight:   97.7 kg   Height:        Intake/Output Summary (Last 24 hours) at 02/06/2024 0802 Last data filed at 02/06/2024 0709 Gross per 24 hour  Intake 1741.02 ml  Output 2050 ml  Net -308.98 ml   Filed Weights   02/05/24 1853 02/06/24 0500  Weight: 98.3 kg 97.7 kg    Examination:  General exam: Overall comfortable, not in distress, chronically deconditioned, weak HEENT: PERRL Respiratory  system:  no wheezes or crackles  Cardiovascular system: S1 & S2 heard, RRR.  Gastrointestinal system: Abdomen is nondistended, soft and nontender.  Ileostomy with liquid stool Central nervous system: Alert and oriented Extremities: No edema, no clubbing ,no cyanosis Skin: No rashes, no ulcers,no icterus     Data Reviewed: I have personally reviewed following labs and imaging studies  CBC: Recent Labs  Lab 02/05/24 1326 02/05/24 1923 02/06/24 0511  WBC 8.8 8.5 7.5  NEUTROABS 7.2  --   --   HGB 8.9* 8.6* 8.2*  HCT 30.5* 27.8* 27.7*  MCV 90.2 87.1 89.6  PLT 136* 128* 141*   Basic Metabolic Panel: Recent Labs  Lab 02/05/24 1326 02/05/24 1923 02/06/24 0511  NA 129*  --  131*  K 5.0  --  4.7  CL 98  --  101  CO2 15*  --  14*  GLUCOSE 132*  --  122*  BUN 52*  --  55*  CREATININE 3.16* 3.00* 3.13*  CALCIUM  9.1  --  8.8*     Recent Results (from the past 240 hours)  Blood Culture (routine x 2)     Status: None (Preliminary result)  Collection Time: 02/05/24  1:26 PM   Specimen: BLOOD  Result Value Ref Range Status   Specimen Description   Final    BLOOD RIGHT ANTECUBITAL Performed at [facility], [address] [place], [place] [address]    Special Requests   Final    BOTTLES DRAWN AEROBIC AND ANAEROBIC Blood Culture adequate volume Performed at [facility], [address] [place], [place] [address]    Culture   Final    NO GROWTH < 12 HOURS Performed at [facility], [address], [place], [place] [address]    Report Status PENDING  Incomplete  Blood Culture (routine x 2)     Status: None (Preliminary result)   Collection Time: 02/05/24  1:26 PM   Specimen: BLOOD  Result Value Ref Range Status   Specimen Description   Final    BLOOD BLOOD RIGHT HAND Performed at [facility], [address] [place], [place] [address]    Special Requests   Final    BOTTLES DRAWN AEROBIC AND ANAEROBIC Blood  Culture adequate volume Performed at [facility], [address] [place], [place] [address]    Culture  Setup Time   Final    GRAM NEGATIVE RODS AEROBIC BOTTLE ONLY Organism ID to follow    Culture   Final    NO GROWTH < 12 HOURS Performed at [facility], [address], [place], [place] [address]    Report Status PENDING  Incomplete     Radiology Studies: CT ABDOMEN PELVIS WO CONTRAST Result Date: 02/05/2024 CLINICAL DATA:  Acute abdominal pain EXAM: CT ABDOMEN AND PELVIS WITHOUT CONTRAST TECHNIQUE: Multidetector CT imaging of the abdomen and pelvis was performed following the standard protocol without IV contrast. RADIATION DOSE REDUCTION: This exam was performed according to the departmental dose-optimization program which includes automated exposure control, adjustment of the mA and/or kV according to patient size and/or use of iterative reconstruction technique. COMPARISON:  CT abdomen and pelvis 12/02/2023. FINDINGS: Lower chest: No acute abnormality. Hepatobiliary: No focal liver abnormality is seen. No gallstones, gallbladder wall thickening, or biliary dilatation. Pancreas: Unremarkable. No pancreatic ductal dilatation or surrounding inflammatory changes. Spleen: Normal in size without focal abnormality. Adrenals/Urinary Tract: There is severe right and moderate left hydroureteronephrosis to the level of the bladder. No obstructing calculi are seen. There is bilateral perinephric fat stranding. Adrenal glands appear normal. The bladder is distended. There is bladder wall trabeculation and mild thickening. There some questionable mild hyperdensity layering within the dependent portion of the bladder. Foley catheter is malpositioned with balloon in the penile urethra. Stomach/Bowel: Stomach is within normal limits. Appendix appears normal. No evidence of bowel wall thickening, distention, or inflammatory changes. Right-sided ostomy present. Vascular/Lymphatic:  Aortic atherosclerosis. No enlarged abdominal or pelvic lymph nodes. Reproductive: Prostate gland is enlarged with some nodular protrusion into the bladder base. Other: There is a peristomal hernia containing nondilated bowel. There some skin thickening of the right anterior abdominal wall. There is no focal abdominal wall fluid collection. There is no ascites. Musculoskeletal: Degenerative changes affect the spine. IMPRESSION: 1. Foley catheter is malpositioned with balloon in the penile urethra. 2. Severe right and moderate left hydroureteronephrosis to the level of the bladder. No obstructing calculi are seen. Findings are concerning for bladder outlet obstruction. 3. Bladder wall trabeculation and mild thickening. Findings may be related to chronic outlet obstruction. Correlate clinically for cystitis. 4. Questionable mild hyperdensity layering within the dependent  portion of the bladder. Findings may represent blood products or debris. 5. Right-sided ostomy with peristomal hernia containing nondilated bowel. 6. Skin thickening of the right anterior abdominal wall. Correlate clinically for cellulitis. 7. Aortic atherosclerosis. Aortic Atherosclerosis (ICD10-I70.0). Electronically Signed   By: Greig Pique M.D.   On: 02/05/2024 18:42   DG Chest Port 1 View Result Date: 02/05/2024 CLINICAL DATA:  Sepsis. EXAM: PORTABLE CHEST 1 VIEW COMPARISON:  January 09, 2024. FINDINGS: The heart size and mediastinal contours are within normal limits. Calcified granuloma seen in right lower lobe. No acute pulmonary disease. Severe degenerative changes seen involving left glenohumeral joint. IMPRESSION: No active disease. Electronically Signed   By: Lynwood Landy Raddle M.D.   On: 02/05/2024 13:53    Scheduled Meds:  ARIPiprazole   2 mg Oral Daily   busPIRone   10 mg Oral TID   carvedilol   6.25 mg Oral BID WC   Chlorhexidine  Gluconate Cloth  6 each Topical Daily   finasteride   5 mg Oral Daily   heparin   5,000 Units  Subcutaneous Q8H   loperamide   2 mg Oral BID   mirtazapine   30 mg Oral QHS   polycarbophil  625 mg Oral BID   pravastatin   20 mg Oral Daily   primidone   50 mg Oral BID   tamsulosin   0.4 mg Oral Daily   traZODone   100 mg Oral QHS   venlafaxine  XR  225 mg Oral Daily   Continuous Infusions:  sodium chloride  75 mL/hr at 02/06/24 0313   piperacillin -tazobactam (ZOSYN )  IV 3.375 g (02/06/24 0519)     LOS: 1 day   Ivonne Mustache, MD [facility] P9/09/2023, 8:02 AM

## 2024-02-06 NOTE — NC FL2 (Signed)
 [place]  MEDICAID FL2 LEVEL OF CARE FORM     IDENTIFICATION  Patient Name: Calvin Escobar Birthdate: [birth year] Sex: male Admission Date (Current Location): 02/05/2024  [facility] [place] Number:      Facility and Address:  [facility],  [address] [place], [place] [address]      Provider Number: [id number]  Attending Physician Name and Address:  Jillian Buttery, MD  Relative Name and Phone Number:  [name](spouse)[phone]    Current Level of Care: Hospital Recommended Level of Care: Skilled Nursing Facility Prior Approval Number:    Date Approved/Denied:   PASRR Number: [id number]  Discharge Plan: SNF    Current Diagnoses: Patient Active Problem List   Diagnosis Date Noted   Acute kidney injury (HCC) 02/05/2024   Malnutrition of moderate degree 01/14/2024   OSA on CPAP 01/14/2024   Diverticulitis of sigmoid colon s/p robotic colectomy 01/09/2024 01/14/2024   Ileostomy in place [facility]) 01/11/2024   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   History of COVID-19 01/09/2024   History of urinary retention 01/09/2024   Chronic indwelling Foley catheter 01/09/2024   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   Chronic HFrEF (heart failure with reduced ejection fraction) (HCC) 12/13/2023   Hematuria 12/02/2023   Medication management 11/05/2023   Hydronephrosis of left kidney 09/28/2023   Prostatic mass 09/28/2023   Weight loss, abnormal 08/14/2023   Hypocalcemia 08/14/2023   Hypoalbuminemia 08/14/2023   GERD (gastroesophageal reflux disease) 07/13/2023   Recurrent UTI 07/01/2023   LBBB (left bundle branch block) 06/02/2023   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   Acute kidney injury superimposed on chronic kidney disease (HCC) 05/13/2023   Obesity (BMI 30-39.9) 05/13/2023   Tremor 05/08/2023   Lack of appetite 05/08/2023   Chronic kidney disease, stage 3a (HCC) 03/07/2023   Anemia of chronic disease 03/07/2023   Limp 12/19/2022   Gynecomastia, male  12/19/2022   Chronic back pain 12/19/2022   Hypertension 11/21/2022   Generalized arthritis 07/18/2022   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   DCM (dilated cardiomyopathy) (HCC)    OSA treated with BiPAP    Primary insomnia 08/05/2017   Dementia (HCC)    HLD (hyperlipidemia)    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   BPH (benign prostatic hyperplasia) 03/27/2012   Morbid obesity (HCC) 03/27/2012   Bipolar affective disorder, depressed (HCC) 05/10/2007   Chronic bronchitis (HCC) 05/10/2007    Orientation RESPIRATION BLADDER Height & Weight     Self  Normal Indwelling catheter (coude) Weight: 97.7 kg Height:  5' 6 (167.6 cm)  BEHAVIORAL SYMPTOMS/MOOD NEUROLOGICAL BOWEL NUTRITION STATUS      Ileostomy Diet (Soft)  AMBULATORY STATUS COMMUNICATION OF NEEDS Skin   Limited Assist Verbally Normal                       Personal Care Assistance Level of Assistance  Bathing, Feeding, Dressing Bathing Assistance: Limited assistance Feeding assistance: Limited assistance Dressing Assistance: Limited assistance     Functional Limitations Info  Sight, Hearing, Speech Sight Info: Impaired (eyeglasses) Hearing Info: Adequate Speech Info: Adequate    SPECIAL CARE FACTORS FREQUENCY  PT (By licensed PT), OT (By licensed OT)     PT Frequency: 5x week OT Frequency: 5x week            Contractures Contractures Info: Not present    Additional Factors Info  Code Status, Allergies Code Status Info: Full Allergies Info: Albuterol   Current Medications (02/06/2024):  This is the current hospital active medication list Current Facility-Administered Medications  Medication Dose Route Frequency Provider Last Rate Last Admin   0.9 %  sodium chloride  infusion   Intravenous Continuous Jillian Buttery, MD       acetaminophen  (TYLENOL ) tablet 650 mg  650 mg Oral Q6H PRN Arshad, Mohsin A, MD   650 mg at 02/05/24 1907   Or   acetaminophen  (TYLENOL ) suppository  650 mg  650 mg Rectal Q6H PRN Arshad, Mohsin A, MD       ARIPiprazole  (ABILIFY ) tablet 2 mg  2 mg Oral Daily Arshad, Mohsin A, MD   2 mg at 02/06/24 1141   busPIRone  (BUSPAR ) tablet 10 mg  10 mg Oral TID Arshad, Mohsin A, MD   10 mg at 02/06/24 1138   carvedilol  (COREG ) tablet 6.25 mg  6.25 mg Oral BID WC Arshad, Mohsin A, MD       cefTRIAXone  (ROCEPHIN ) 2 g in sodium chloride  0.9 % 100 mL IVPB  2 g Intravenous Q24H Carolee Browning T, RPH 200 mL/hr at 02/06/24 1256 2 g at 02/06/24 1256   Chlorhexidine  Gluconate Cloth 2 % PADS 6 each  6 each Topical Daily Chavez, Abigail, NP   6 each at 02/06/24 1142   finasteride  (PROSCAR ) tablet 5 mg  5 mg Oral Daily Arshad, Mohsin A, MD   5 mg at 02/06/24 1138   heparin  injection 5,000 Units  5,000 Units Subcutaneous Q8H Arshad, Mohsin A, MD   5,000 Units at 02/06/24 0510   HYDROcodone -acetaminophen  (NORCO/VICODIN) 5-325 MG per tablet 1-2 tablet  1-2 tablet Oral Q8H PRN Arshad, Mohsin A, MD       loperamide  (IMODIUM ) capsule 2 mg  2 mg Oral q12n4p Adhikari, Amrit, MD   2 mg at 02/06/24 1142   loperamide  (IMODIUM ) capsule 2-4 mg  2-4 mg Oral Q6H PRN Arshad, Mohsin A, MD       metroNIDAZOLE  (FLAGYL ) IVPB 500 mg  500 mg Intravenous Q12H Carolee Browning T, RPH 100 mL/hr at 02/06/24 1144 500 mg at 02/06/24 1144   mirtazapine  (REMERON ) tablet 30 mg  30 mg Oral QHS Arshad, Mohsin A, MD   30 mg at 02/05/24 2236   morphine  (PF) 2 MG/ML injection 2 mg  2 mg Intravenous Q6H PRN Arshad, Mohsin A, MD       ondansetron  (ZOFRAN ) tablet 4 mg  4 mg Oral Q8H PRN Arshad, Mohsin A, MD       polycarbophil (FIBERCON) tablet 625 mg  625 mg Oral BID Arshad, Mohsin A, MD   625 mg at 02/06/24 1140   pravastatin  (PRAVACHOL ) tablet 20 mg  20 mg Oral Daily Arshad, Mohsin A, MD   20 mg at 02/06/24 1138   primidone  (MYSOLINE ) tablet 50 mg  50 mg Oral BID Arshad, Mohsin A, MD   50 mg at 02/06/24 1140   tamsulosin  (FLOMAX ) capsule 0.4 mg  0.4 mg Oral Daily Arshad, Mohsin A, MD   0.4 mg at 02/06/24  1138   traZODone  (DESYREL ) tablet 100 mg  100 mg Oral QHS Arshad, Mohsin A, MD   100 mg at 02/05/24 2236   venlafaxine  XR (EFFEXOR -XR) 24 hr capsule 225 mg  225 mg Oral Daily Arshad, Mohsin A, MD   225 mg at 02/06/24 1138     Discharge Medications: Please see discharge summary for a list of discharge medications.  Relevant Imaging Results:  Relevant Lab Results:   Additional Information ss#[phone], [place], [place]

## 2024-02-07 DIAGNOSIS — N189 Chronic kidney disease, unspecified: Secondary | ICD-10-CM | POA: Diagnosis not present

## 2024-02-07 DIAGNOSIS — N179 Acute kidney failure, unspecified: Secondary | ICD-10-CM | POA: Diagnosis not present

## 2024-02-07 LAB — CBC
HCT: 26 % — ABNORMAL LOW (ref 39.0–52.0)
Hemoglobin: 7.5 g/dL — ABNORMAL LOW (ref 13.0–17.0)
MCH: 26.1 pg (ref 26.0–34.0)
MCHC: 28.8 g/dL — ABNORMAL LOW (ref 30.0–36.0)
MCV: 90.6 fL (ref 80.0–100.0)
Platelets: 149 K/uL — ABNORMAL LOW (ref 150–400)
RBC: 2.87 MIL/uL — ABNORMAL LOW (ref 4.22–5.81)
RDW: 15 % (ref 11.5–15.5)
WBC: 5.4 K/uL (ref 4.0–10.5)
nRBC: 0 % (ref 0.0–0.2)

## 2024-02-07 LAB — IRON AND TIBC
Iron: 17 ug/dL — ABNORMAL LOW (ref 45–182)
Saturation Ratios: 12 % — ABNORMAL LOW (ref 17.9–39.5)
TIBC: 135 ug/dL — ABNORMAL LOW (ref 250–450)
UIBC: 119 ug/dL

## 2024-02-07 LAB — BASIC METABOLIC PANEL WITH GFR
Anion gap: 12 (ref 5–15)
BUN: 40 mg/dL — ABNORMAL HIGH (ref 8–23)
CO2: 14 mmol/L — ABNORMAL LOW (ref 22–32)
Calcium: 8.2 mg/dL — ABNORMAL LOW (ref 8.9–10.3)
Chloride: 105 mmol/L (ref 98–111)
Creatinine, Ser: 2.22 mg/dL — ABNORMAL HIGH (ref 0.61–1.24)
GFR, Estimated: 30 mL/min — ABNORMAL LOW (ref >60)
Glucose, Bld: 109 mg/dL — ABNORMAL HIGH (ref 70–99)
Potassium: 4.2 mmol/L (ref 3.5–5.1)
Sodium: 131 mmol/L — ABNORMAL LOW (ref 135–145)

## 2024-02-07 MED ORDER — IRON SUCROSE 200 MG IVPB - SIMPLE MED
200.0000 mg | Freq: Once | Status: AC
  Administered 2024-02-07: 200 mg via INTRAVENOUS
  Filled 2024-02-07: qty 200

## 2024-02-07 MED ORDER — ADULT MULTIVITAMIN W/MINERALS CH
1.0000 | ORAL_TABLET | Freq: Every day | ORAL | Status: DC
  Administered 2024-02-08 – 2024-02-09 (×2): 1 via ORAL
  Filled 2024-02-07 (×2): qty 1

## 2024-02-07 MED ORDER — ENSURE PLUS HIGH PROTEIN PO LIQD
237.0000 mL | Freq: Three times a day (TID) | ORAL | Status: DC
  Administered 2024-02-07 – 2024-02-09 (×5): 237 mL via ORAL

## 2024-02-07 NOTE — TOC Progression Note (Incomplete)
 Transition of Care [facility]) - Progression Note    Patient Details  Name: Calvin Escobar MRN: [id number] Date of Birth: 11/12/1950  Transition [facility]) CM/SW Contact  Sheri ONEIDA [place], [place] Phone Number: 02/07/2024, 10:22 AM  Clinical Narrative:    Bed choice Jame. Ins auth started. Auth ID [id number]. Per Shona pt can admit over weekend.    Expected Discharge Plan: Skilled Nursing Facility Barriers to Discharge: Continued Medical Work up               Expected Discharge Plan and Services   Discharge Planning Services: CM Consult Post Acute Care Choice: Skilled Nursing Facility Living arrangements for the past 2 months: Single Family Home                                       Social Drivers of Health (SDOH) Interventions SDOH Screenings   Food Insecurity: No Food Insecurity (02/05/2024)  Housing: Low Risk  (02/05/2024)  Transportation Needs: No Transportation Needs (02/05/2024)  Utilities: Not At Risk (02/05/2024)  Alcohol Screen: Low Risk  (10/10/2023)  Depression (PHQ2-9): Low Risk  (12/09/2023)  Recent Concern: Depression (PHQ2-9) - Medium Risk (09/16/2023)  Financial Resource Strain: Low Risk  (10/10/2023)  Physical Activity: Sufficiently Active (10/10/2023)  Social Connections: Moderately Integrated (02/05/2024)  Recent Concern: Social Connections - Socially Isolated (12/11/2023)  Stress: No Stress Concern Present (10/10/2023)  Tobacco Use: Low Risk  (02/05/2024)  Health Literacy: Adequate Health Literacy (10/10/2023)    Readmission Risk Interventions    01/14/2024    2:09 PM 07/04/2023    1:27 PM 07/03/2023    2:48 PM  Readmission Risk Prevention Plan  Transportation Screening  Complete Complete  PCP or Specialist Appt within 5-7 Days  Complete Complete  Home Care Screening  Complete Complete  Medication Review (RN CM)  Complete Complete  Medication Review (RN Care Manager) Complete    PCP or Specialist appointment within 3-5 days of discharge Complete    HRI  or Home Care Consult Complete    SW Recovery Care/Counseling Consult Complete    Palliative Care Screening Not Applicable    Skilled Nursing Facility Complete

## 2024-02-07 NOTE — Progress Notes (Signed)
 Initial Nutrition Assessment  DOCUMENTATION CODES:   Non-severe (moderate) malnutrition in context of chronic illness  INTERVENTION:   -Ensure Plus High Protein po TID, each supplement provides 350 kcal and 20 grams of protein.   -Multivitamin with minerals daily  -Checking vitamin labs: Zinc , copper , selenium, Vitamin D  given high ostomy output and malnutrition  -Will place Ileostomy Nutrition Therapy in AVS  NUTRITION DIAGNOSIS:   Moderate Malnutrition related to chronic illness as evidenced by moderate fat depletion, moderate muscle depletion, energy intake < or equal to 75% for > or equal to 1 month, percent weight loss.  GOAL:   Patient will meet greater than or equal to 90% of their needs  MONITOR:   PO intake, Supplement acceptance  REASON FOR ASSESSMENT:   Consult Assessment of nutrition requirement/status  ASSESSMENT:   73 year old male with recent hospitalization for colovesical fistula/sigmoid diverticulitis status post robotic rectosigmoid resection with anastomosis, colovesical fistula takedown and repair, diverting ileostomy, drainage of pelvic abscess and lysis of adhesions and was discharged to nursing facility on 8/12, urethral stricture with chronic indwelling Foley catheter, recurrent UTI/bacteremia, 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  Patient in room, reports eating very minimal. Only at a bit of breakfast but couldn't recall what he ate specifically. Denies any issues with swallowing or chewing. Does report some taste changes, cannot taste much. Pt has not eaten well since going to SNF following previous admission in early August. Has not drank Ensure since he left hospital last time. Given he hasn't been eating well and he has had high ostomy output, would recommend checking some vitamin labs. Pt reports taking  vitamins, folic acid  and iron .  Per weight records, pt has lost  29 lbs since 6/10 (11% wt loss x 3 months, significant for time frame).  Medications: Imodium , Remeron , Fibercon  Labs reviewed: Low Na Low iron    NUTRITION - FOCUSED PHYSICAL EXAM:  Flowsheet Row Most Recent Value  Orbital Region Moderate depletion  Upper Arm Region Moderate depletion  Thoracic and Lumbar Region Mild depletion  Buccal Region Severe depletion  Temple Region Moderate depletion  Clavicle Bone Region Moderate depletion  Clavicle and Acromion Bone Region Moderate depletion  Scapular Bone Region Moderate depletion  Dorsal Hand Moderate depletion  Patellar Region Unable to assess  Anterior Thigh Region Unable to assess  Posterior Calf Region Unable to assess  Edema (RD Assessment) None  Hair Reviewed  Eyes Reviewed  Mouth Reviewed  [missing most teeth]  Skin Reviewed  Nails Reviewed    Diet Order:   Diet Order             DIET SOFT Room service appropriate? Yes; Fluid consistency: Thin  Diet effective now                   EDUCATION NEEDS:   Education needs have been addressed  Skin:  Skin Assessment: Reviewed RN Assessment  Last BM:  9/5 -ileostomy  Height:   Ht Readings from Last 1 Encounters:  02/05/24 5' 6 (1.676 m)    Weight:   Wt Readings from Last 1 Encounters:  02/07/24 99.2 kg   BMI:  Body mass index is 35.3 kg/m.  Estimated Nutritional Needs:   Kcal:  1950-2150  Protein:  95-110g  Fluid:  2L/day   Morna Lee, MS, RD, LDN Inpatient Clinical Dietitian Contact via Secure chat

## 2024-02-07 NOTE — Plan of Care (Signed)
  Problem: Clinical Measurements: Goal: Ability to maintain clinical measurements within normal limits will improve Outcome: Progressing Goal: Will remain free from infection Outcome: Progressing Goal: Diagnostic test results will improve Outcome: Progressing Goal: Respiratory complications will improve Outcome: Progressing   Problem: Education: Goal: Knowledge of General Education information will improve Description: Including pain rating scale, medication(s)/side effects and non-pharmacologic comfort measures Outcome: Not Progressing   Problem: Health Behavior/Discharge Planning: Goal: Ability to manage health-related needs will improve Outcome: Not Progressing

## 2024-02-07 NOTE — Discharge Instructions (Signed)
 Ileostomy Nutrition Therapy   During ileostomy surgery, the entire colon, rectum, and anus are removed or bypassed. The part of the small intestine called the ileum is brought through the abdominal wall, creating a stoma. The stoma is an opening in the abdomen that must be covered with a bag to collect stools at all times. It can be temporary or permanent depending on the surgery.  After surgery, your bowel will be swollen. Your eating plan will begin with a diet of clear liquids. As you recover, you will start eating solid foods, beginning with foods that are low in fiber (see Recommended Foods). You should avoid high-fiber foods because they are harder to digest. Avoiding high-fiber foods will allow the bowel to heal and prevent blockage of the ileostomy.  You should have less than 8 grams of fiber per day when you move from liquids to solid foods and then transition to less than 13 grams of fiber for the whole day in the next few days as your symptoms decrease. Most patients begin to eat more normally 6 weeks after surgery.    The changes to your diet recommended on this handout can help reduce symptoms such as diarrhea, odor, and gas; help you avoid a blockage; and help your body get more nutrients from your food as you heal from surgery.   Tips   You may not feel like eating much, so eat small amounts every 2 to 4 hours. Keep a regular schedule for meals and snacks to help reduce gas and help your body absorb nutrients from foods.  Let your health care provider know if you see whole foods or pills in your ostomy bag.  Do not use time-released, enteric-coated medications or very large tablets. Also avoid laxatives, which can cause dehydration.  Take a chewable (non-gummy) multivitamin with minerals daily.  Take a chewable or liquid calcium supplement. Liquid calcium citrate can be taken with food or without food.  Have your largest meal in the middle of the day. Do not eat large amounts in the  evening. This can help decrease stool output at night, so that you can limit emptying the ostomy bag then.  Eat foods that may thicken stool several times a day. Some foods can change the color of the stool. (See the Food Selection Guide.)   When you begin to add more variety back into your diet, add only 1 new food every few days. If there are foods that bothered you before surgery, add other foods first. Eat only a small amount when you re-try foods. If a food makes you feel sick, wait a few weeks and then re-try it. Keep a log of foods you try and how you feel when eating them.  To reduce gas, avoid chewing gum, drinking with straws and drinking carbonated beverages, smoking or chewing tobacco, eating too fast, and skipping meals. Missing meals can increase gas and watery stools.   Some foods may cause blockages (see Food Selection Guide). Use caution when eating these foods. Eat small amounts and chew your foods well to prevent blockage.  Get enough fluids. Aim for at least 8 to 10 cups of liquid per day. Drink liquids 30 minutes after meals or snacks to avoid flushing foods through your system too quickly.  During times of higher output (1,800 milliliters per day or more) or heavy sweating, you need to drink more fluids. You may need to actually measure how much you are drinking and your output from your ostomy   when it is high:   1 ounce is 30 milliliters o 1 cup is 8 ounces  8 cups is 64 ounces or 2 quarts or 2 liters  Watch for signs and symptoms of fluid-electrolyte imbalance. If symptoms occur, seek treatment right away. Symptoms include:   Dry mouth  Reduced urine output (not as much urine or urinating less often than normal)  Dark-colored urine  Feeling dizzy when you stand up  Noticeable fatigue (feeling extremely tired)  Abdominal cramping  If you have a high output ostomy, you may need to use an oral rehydration solution (ORS) to replace fluid loss. The World Health Organization has a  solution in powder form you can buy (called Oral Rehydration Salts). Some sports drinks can increase stoma output, so pediatric electrolyte solutions, such as Pedialyte, are recommended instead. A less expensive option is to make the oral rehydration solution yourself using one of the following recipes:   2 cups Gatorade + 2 cups water +  teaspoon salt  3 cups water + 1 cup orange juice +  teaspoon salt +  teaspoon baking soda o  cup grape juice or cranberry juice + 3 cups water +  teaspoon salt o 1 cup apple juice + 3 cups water +  teaspoon salt  4 cups (1 liter) water +  teaspoon table salt + 6 level teaspoons sugar (World Health Organization's ORS recipe)  Your registered dietitian nutritionist or doctor may suggest increasing foods that are higher in sodium and potassium. Remember to choose lower fiber foods that are high in potassium. Some examples of high-potassium foods include soy milk, yogurt, cottage cheese, potatoes without skin, liquid supplements (such as Ensure Muscle Health), orange juice or tomato juice (if these do not cause reflux or other symptoms), smooth peanut butter, and baked or broiled salmon or turkey. Salt substitute that contains potassium chloride can also be used, but be careful not to overuse it. Higher-sodium foods added to the diet should also be lower fiber and not fried.   Recommended Foods  If particular foods make you feel unwell, stop eating them and try again in 2 to 3 weeks.  Everyone's tolerance to foods is different. Finding foods that are best for you may require some trial and error.   Dairy Foods  Recommended Foods  Notes   Fat-free (skim) or low-fat (1%) milk*  Soy milk, rice milk, or almond milk  Lactose-free milk  Yogurt*  Powdered milk  Cheese*  Buttermilk  Low-fat ice cream* or sherbet  If you feel unwell after drinking milk or eating dairy foods, try lactose-free products. Foods marked with an asterisk (*) on this list have lactose.   Aged cheeses (such as cheddar and Swiss) are lower in lactose.  Check labels for calcium content of almond milk and rice milk to make sure they have 30% calcium. These beverages are not high in protein, so include other high-protein foods at meals and snacks to support healing.  Soy milk may cause gas and bloating for some people. It is best to avoid soy milk if it causes symptoms.    Protein Foods  Recommended Foods  Notes   Very tender, well-cooked meats and poultry prepared without added fat  Fish  Smooth nut butter (limit to 1 to 2 tablespoons a day)  Eggs (scrambled eggs are easiest to digest)  When trying nuts, fish, and eggs, start with small amounts. These foods may cause odors.  Use a moist heating method for meats and poultry:     Use water or broth to cook the meat or poultry at a lower temperature.  Cover the dish when cooking in the oven, so the food cooks in its own juices.    Marinate meat first with an acidic ingredient, such as vinegar, lemon juice, or wine, and some oil or by using chopped raw pineapple, which has natural enzymes. Pour off the marinade before cooking.     Grains  Recommended Foods  Notes   Grain foods made from white or refined flour, including bread, bagels, rolls; crackers; pasta; and cereal  White rice  Cream of wheat or cream of rice  Refined grits  Cereals made from refined grains, without added fiber, such as rice chex or cornflakes  Choose grain foods with less than 2 grams of fiber per serving. The grams of dietary fiber in 1 serving are listed on the Nutrition Facts label of packaged foods.  Read labels if you have problems with lactose. Any foods containing milk may contain lactose.    Vegetables  Recommended Foods  Notes   Well-cooked vegetables without seeds or skins, such as green beans or carrots  Potatoes without skin  Shredded lettuce on sandwich  Strained vegetable juice  Some vegetables may cause gas, blockages, or odors for some  people. See the Food Selection Guide for foods that may cause symptoms.    Fruits  Recommended Foods  Notes   Pulp-free fruit juices (except prune juice)  Ripe banana  Soft melons (watermelon or honeydew)  Peeled and cooked apple  Canned fruits (except pineapple), but avoid heavy syrup, which can make diarrhea worse  These can be added later with doctor's OK to increase fiber:  Some fruits may cause blockages. See the Food Selection Guide for foods that may cause symptoms.  Look for 100% fruit juices. These may need to be diluted in half or used to make oral rehydration solutions to be tolerated well.   ?  ?  Avocado  Orange or grapefruit without  membrane  Grapefruit and grapefruit juice should not be eaten when taking statin-type medications or budesonide (Entocort).    Fats   Recommended Foods  Notes   Any; olive oil and canola oil are good choices for heart health.  Start with very small amounts. Limit fats and oils to less than 8 teaspoons per day. Fats may cause symptoms or discomfort.  Spreads, butter contain lactose.    Beverages  Recommended Beverages  Notes   Water  Decaffeinated coffee or tea  Noncarbonated beverages  Rehydration beverages  Carbonated beverages may cause gas.     Foods Not Recommended    When you first start eating solid foods, avoid foods that are high in fiber, such as whole grains, dried beans, and most raw vegetables and fruits. (See Foods Recommended list for low-fiber foods. See Food Selection Guide lists for foods that cause symptoms.) ? Avoid acidic, spicy, fried, and greasy foods and foods high in sugar.  Limit food and drinks that contain sugar substitutes. Often diluted sugar-containing drinks do better. Cut fruit juice in half by adding an equal amount of water or more.  While you heal, avoid any foods that cause you to have odor, gas, diarrhea, or obstruction.  You should not have any salad or raw vegetables.  If you have kidney stones, you  should try to avoid foods that are high in oxalate. Your RDN may give you a more detailed list of high-oxalate foods if you are at risk. Some foods   that are high in oxalate include:   Grains: Wheat bran, wheat germ, whole wheat flour   Protein Foods: Beans, tofu, nuts  Vegetables: Beets, dark leafy greens, sweet potato  Beverages: Beer, cocoa, instant tea, instant coffee  Other: Carob, chocolate    Food Selection Guide for Ileostomy Patients   Foods That May Cause Blockage   Apples, unpeeled  Bean sprouts  Cabbage, raw  Casing on sausage  Celery  Chinese vegetables  Coconut  Coleslaw  Corn  Popcorn  Relishes and olives  Salad greens  Seeds and nuts  Spinach Tough, fibrous meats (for example, steak on grill)  Vegetable and fruit skins  Whole grains  Cucumbers  Dried fruit, raisins  Grapes  Green peppers  Mushrooms  Nuts  Peas  Pickles  Pineapple      Foods That May Cause Gas or Odor  Alcohol  Apples  Asparagus  Bananas  Beer  Broccoli  Brussels sprouts  Cabbage  Carbonated beverages  Cauliflower  Cheese, some types  Corn  Cucumber  Dairy products  Dried beans and peas  Eggs  Fatty foods  Fish  Grapes  Green pepper  Melons  Onions  Peanuts  Prunes  Radishes  Turnips     Foods That May Help Relieve Gas and Odor  Buttermilk  Cranberry juice  Parsley  Yogurt with active cultures      Diarrhea   Initially after surgery, the stool will be liquid and watery because of where the ileostomy is located in the intestine. The stool will gradually become thicker over the next few weeks (more like a consistency of pudding or oatmeal). There are some foods that can help make the stool thicker or thinner, but if changes to your diet do not improve stools, then your doctor may be able to recommend some medications to help.   Foods That May Cause Diarrhea (looser or more frequent stool)  Alcohol (including beer)  Apricots (and stone fruits)  Beans, baked or  legumes  Bran  Broccoli  Brussels sprouts  Cabbage  Caffeinated drinks  (especially hot)  Chocolate  Corn  Fried meats, fish poultry  Fruit juice: apple, grape, orange  Fruit: fresh, canned, or dried  Glucose-free foods containing mannitol or  sorbitol  Gum, sugar free   High-fat foods  High-sugar foods  Licorice  Milk and dairy foods  Nuts or seeds  Peaches (stone fruit)  Peas  Plums (stone fruit)  Prune juice or prunes  Soup  Spicy foods  Sugar-free substitutes  Tomatoes  Turnip greens/green leafy vegetables, raw  Wheat/whole grains  Wine    Foods That May Help Thicken Stool  Applesauce   Bananas   Barley (when OK to have fiber)   Cheese   Marshmallows   Oatmeal (when OK to have fiber)  Pasta (sauces may increase symptoms)  Peanut butter, creamy  Potatoes, no skin  Pretzels  Saltines  Tapioca  White bread (not high fiber)  White rice, boiled  Yogurt   Foods That May Discolor Stool   Turn stool red  Darken stool  Beets  Asparagus  Foods with red dye  Broccoli   Spinach      Ileostomy Sample 1-Day Menu  Note: It may help to not have beverages with meals or snacks, waiting 30 minutes before drinking. Fiber is rounded to the nearest 0.5g and listed in parentheses ( ) after foods that contain fiber.   Meal  Menu  Total Fiber   Breakfast  1   scrambled egg plain or with  cup low-fat, low-lactose cheese  English muffin or white toast (1.5 grams fiber) with 1 teaspoon margarine   cup unsweetened applesauce (1.5 grams fiber)  3.0 grams   30 minutes after breakfast  4 ounces cranberry grape juice diluted with 4 ounces water   1 cups (12 ounces) decaf tea     Lunch   2 ounces shaved or very thinly sliced roast beef with juices   cup mashed potatoes (1.5 grams fiber) (made with lactose-free milk or chicken broth and olive oil or very small amount of butter)   cup green beans, well-cooked (2.0 grams fiber)  1 cup canned peaches, extra light syrup (1.0 gram  fiber)  4.5 grams   30 minutes after lunch  1 cup (8 ounces) fat-free, soy, or lactose-free milk     Snack  8 ounces yogurt without fruits or nuts  1 ripe banana (3.0 grams fiber)    3.0 grams   30 min after snack  1 cup (8 ounces) sport drink or Pedialyte or Oral Rehydration Solution (ORS) if needed     Dinner  Turkey sandwich: 2 ounces turkey, 1 ounce Swiss cheese,  2 slices white bread (1.0 gram fiber)  2 ounces pretzels (1.0 gram fiber)  2.0 grams   30 minutes after dinner  1 cups water or Gatorade/Pedialyte or ORS     Snack   6 saltine crackers (0.5 grams fiber)  1 ounce low-fat cheddar cheese or Swiss  0.5 grams   30 minutes after snack  1 cup (8 ounces) fat-free, soy, or lactose-free milk         13 grams fiber daily      

## 2024-02-07 NOTE — Progress Notes (Signed)
 Physical Therapy Treatment Patient Details Name: Calvin Escobar MRN: [id number] DOB: 30-Nov-1950 Today's Date: 02/07/2024   History of Present Illness Pt is a 73 year old male with recent hospitalization for colovesical fistula/sigmoid diverticulitis status post robotic rectosigmoid resection with anastomosis, colovesical fistula takedown and repair, diverting ileostomy, drainage of pelvic abscess and lysis of adhesions and was discharged to nursing facility on 8/12, urethral stricture with chronic indwelling Foley catheter, recurrent UTI/bacteremia, 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    PT Comments  Pt assisted with ambulating in hallway and overall required min assist. Pt did have an episode of LOB with ambulation requiring assist to correct to prevent fall.  Pt agreeable to remain in recliner end of session (chair alarm active and RN aware).       If plan is discharge home, recommend the following: A little help with walking and/or transfers;A little help with bathing/dressing/bathroom;Assistance with cooking/housework;Assist for transportation;Help with stairs or ramp for entrance;Supervision due to cognitive status   Can travel by private vehicle        Equipment Recommendations  Rolling walker (2 wheels)    Recommendations for Other Services       Precautions / Restrictions Precautions Precautions: Fall Precaution/Restrictions Comments: ileostomy     Mobility  Bed Mobility Overal bed mobility: Needs Assistance Bed Mobility: Supine to Sit     Supine to sit: Contact guard, HOB elevated          Transfers Overall transfer level: Needs assistance Equipment used: Rolling walker (2 wheels) Transfers: Sit to/from Stand Sit to Stand: Min assist           General transfer comment: verbal cues for hand placement, assist to  control descent    Ambulation/Gait Ambulation/Gait assistance: Min assist, Contact guard assist Gait Distance (Feet): 150 Feet Assistive device: Rolling walker (2 wheels) Gait Pattern/deviations: Decreased stride length, Step-through pattern Gait velocity: decr     General Gait Details: improved stability with use of RW today; min assist end of ambulation due to LOB   Stairs             Wheelchair Mobility     Tilt Bed    Modified Rankin (Stroke Patients Only)       Balance Overall balance assessment: Needs assistance         Standing balance support: Bilateral upper extremity supported, During functional activity Standing balance-Leahy Scale: Poor                              Communication Communication Communication: No apparent difficulties  Cognition Arousal: Alert Behavior During Therapy: Flat affect   PT - Cognitive impairments: History of cognitive impairments                       PT - Cognition Comments: hx dementia, appears pleasantly confused        Cueing    Exercises      General Comments        Pertinent Vitals/Pain Pain Assessment Pain Assessment: No/denies pain    Home Living                          Prior Function            PT Goals (current goals can now be  found in the care plan section) Progress towards PT goals: Progressing toward goals    Frequency    Min 3X/week      PT Plan      Co-evaluation              AM-PAC PT 6 Clicks Mobility   Outcome Measure  Help needed turning from your back to your side while in a flat bed without using bedrails?: A Little Help needed moving from lying on your back to sitting on the side of a flat bed without using bedrails?: A Little Help needed moving to and from a bed to a chair (including a wheelchair)?: A Little Help needed standing up from a chair using your arms (e.g., wheelchair or bedside chair)?: A Little Help needed to  walk in hospital room?: A Little Help needed climbing 3-5 steps with a railing? : A Lot 6 Click Score: 17    End of Session Equipment Utilized During Treatment: Gait belt Activity Tolerance: Patient tolerated treatment well Patient left: in chair;with call bell/phone within reach;with chair alarm set Nurse Communication: Mobility status PT Visit Diagnosis: Difficulty in walking, not elsewhere classified (R26.2)     Time: 8461-8442 PT Time Calculation (min) (ACUTE ONLY): 19 min  Charges:    $Gait Training: 8-22 mins PT General Charges $$ ACUTE PT VISIT: 1 Visit                    Tari KLEIN, DPT Physical Therapist Acute Rehabilitation Services Office: [phone]    Tari CROME [place] 02/07/2024, 4:49 PM

## 2024-02-07 NOTE — Progress Notes (Signed)
 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  Patient and his family had difficulty to apply the bag.  Also reported generalized weakness, decreased oral intake.  When EMS arrived to pick him up, his ileostomy bag was off.  Appeared to have cloudy urine in the Foley bag.  On presentation, he was slightly tachycardic with a stable blood pressure.  Lab work showed creatinine of 3.1, he has baseline creatinine ranges from 1.3-1.4.  UA suspicious for UTI.  Chest x-ray did not show any acute disease.  Started on antibiotics.  Blood cultures showed Klebsiella pneumoniae.  Kidney function improving.  PT recommended SNF on discharge  Assessment & Plan:  Principal Problem:   Acute kidney injury superimposed on chronic kidney disease (HCC) Active Problems:   OSA treated with BiPAP   Anemia of chronic disease   Chronic kidney disease, stage 3a (HCC)   Bipolar affective disorder, depressed (HCC)   BPH (benign prostatic hyperplasia)   Dementia (HCC)   Colovesical fistula   Hypertension   Recurrent UTI   Chronic HFrEF (heart failure with reduced ejection fraction) (HCC)   Chronic indwelling Foley catheter   Ileostomy in place [facility])   Diverticulitis of sigmoid colon s/p robotic colectomy 01/09/2024   Acute kidney injury (HCC)    Sepsis secondary to  UTI/gram-negative bacteremia: History of recurrent UTI with Pseudomonas, Enterococcus, Candida, E. coli. No fever or leukocytosis.  Developed fever of 101.4 on 02/05/2024.  UA suspicious for UTI with large leukocytes.  Blood culture showing Klebsiella.  Currently on ceftriaxone  and Flagyl . Urine culture not sent, we have ordered.    Continue current antibiotics, follow-up final culture and sensitivity.  Afebrile this morning  AKI on CKD stage IIIa: Baseline creatinine of 1.3-1.4.  Presented with creatinine in the range of 3.  Likely from dehydration from large output from ileostomy.  Continue until IV fluid.  Monitor renal function.  Avoid nephrotoxins.  Kidney function improving.  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  Continue gentle IV fluid.  Losartan  held for AKI.  Dose of Coreg  decreased.  Continue to monitor daily output, input  Anemia of chronic disease: Hb in the range of 7-8 .Will check his iron  panel. No report of hematochezia or melena  Hyponatremia: History of chronic hyponatremia but could also be yesterday with dehydration, GI fluid loss.  Continue gentle IV fluids.  Improving  Bipolar disorder/anxiety/depression/dementia: Home gabapentin  on hold.  Continue delirium precaution , frequent reorientation.  Patient is intermittently confused.  Debility/weakness: PT/OT  consulted.  Recently discharged from facility to home.  PT recommended SNF on discharge  DVT prophylaxis:heparin   injection 5,000 Units Start: 02/06/24 2300 Place TED hose Start: 02/05/24 1627     Code Status: Full Code  Family Communication: Called spouse,[name] , and discussed about management plan on 9/5  Patient status:Inpatient  Patient is from :Home  Anticipated discharge to:SNF  Estimated DC date:1-2 days   Consultants: None  Procedures:None  Antimicrobials:  Anti-infectives (From admission, onward)    Start     Dose/Rate Route Frequency Ordered Stop   02/06/24 1000  cefTRIAXone  (ROCEPHIN ) 2 g in sodium chloride  0.9 % 100 mL IVPB        2 g 200 mL/hr over 30 Minutes Intravenous Every 24 hours 02/06/24 0844     02/06/24 1000  metroNIDAZOLE  (FLAGYL ) IVPB 500 mg        500 mg 100 mL/hr over 60 Minutes Intravenous Every 12 hours 02/06/24 0844     02/05/24 2200  piperacillin -tazobactam (ZOSYN ) IVPB 3.375 g  Status:  Discontinued        3.375 g 12.5 mL/hr over 240 Minutes Intravenous Every 8 hours 02/05/24 1652 02/06/24 0844   02/05/24 1230  ceFEPIme  (MAXIPIME ) 2 g in sodium chloride  0.9 % 100 mL IVPB        2 g 200 mL/hr over 30 Minutes Intravenous  Once 02/05/24 1216 02/05/24 1358   02/05/24 1230  metroNIDAZOLE  (FLAGYL ) IVPB 500 mg        500 mg 100 mL/hr over 60 Minutes Intravenous  Once 02/05/24 1216 02/05/24 1455   02/05/24 1230  vancomycin  (VANCOCIN ) IVPB 1000 mg/200 mL premix        1,000 mg 200 mL/hr over 60 Minutes Intravenous  Once 02/05/24 1216 02/05/24 1558       Subjective: Patient seen and examined at bedside today.  He looks very comfortable this morning.  Lying in bed.  Denies any abdomen pain, nausea or vomiting.  Less output from ileostomy.  He stool in the ileostomy is less liquid than yesterday.  He is afebrile.  Kidney function improving.  Objective: Vitals:   02/06/24 1249 02/06/24 2136 02/07/24 0500 02/07/24 0548  BP: 99/62 129/72  103/72  Pulse: 83 90  73  Resp:  20  16  Temp: 98.3 F (36.8 C) 98.4 F (36.9 C)  97.8 F (36.6 C)  TempSrc: Oral    Oral  SpO2: 99% 98%  96%  Weight:   99.2 kg   Height:        Intake/Output Summary (Last 24 hours) at 02/07/2024 1052 Last data filed at 02/07/2024 0909 Gross per 24 hour  Intake 1660 ml  Output 4150 ml  Net -2490 ml   Filed Weights   02/05/24 1853 02/06/24 0500 02/07/24 0500  Weight: 98.3 kg 97.7 kg 99.2 kg    Examination:  General exam: Overall comfortable, not in distress, sitting in bed, eating breakfast HEENT: PERRL Respiratory system:  no wheezes or crackles  Cardiovascular system: S1 & S2 heard, RRR.  Gastrointestinal system: Abdomen is nondistended, soft and nontender.  Ileostomy with liquid stool Central nervous system: Alert and oriented Extremities: No edema, no clubbing ,no cyanosis Skin: No rashes, no ulcers,no icterus   GU: Foley   Data Reviewed: I have personally reviewed following labs and imaging studies  CBC: Recent Labs  Lab 02/05/24 1326 02/05/24 1923 02/06/24 0511 02/07/24 0420  WBC 8.8 8.5 7.5 5.4  NEUTROABS 7.2  --   --   --   HGB 8.9* 8.6* 8.2* 7.5*  HCT 30.5* 27.8* 27.7* 26.0*  MCV  90.2 87.1 89.6 90.6  PLT 136* 128* 141* 149*   Basic Metabolic Panel: Recent Labs  Lab 02/05/24 1326 02/05/24 1923 02/06/24 0511 02/07/24 0420  NA 129*  --  131* 131*  K 5.0  --  4.7 4.2  CL 98  --  101 105  CO2 15*  --  14* 14*  GLUCOSE 132*  --  122* 109*  BUN 52*  --  55* 40*  CREATININE 3.16* 3.00* 3.13* 2.22*  CALCIUM  9.1  --  8.8* 8.2*     Recent Results (from the past 240 hours)  Blood Culture (routine x 2)     Status: Abnormal (Preliminary result)   Collection Time: 02/05/24  1:26 PM   Specimen: BLOOD  Result Value Ref Range Status   Specimen Description   Final    BLOOD RIGHT ANTECUBITAL Performed at [facility], [address], [place], [place] [address]    Special Requests   Final    BOTTLES DRAWN AEROBIC AND ANAEROBIC Blood Culture adequate volume Performed at [facility], [address] [place], [place] [address]    Culture  Setup Time   Final    GRAM NEGATIVE RODS AEROBIC BOTTLE ONLY CRITICAL VALUE NOTED.  VALUE IS CONSISTENT WITH PREVIOUSLY REPORTED AND CALLED VALUE. Performed at [facility], [address], [place], [place] [address]    Culture KLEBSIELLA PNEUMONIAE (A)  Final   Report Status PENDING  Incomplete  Blood Culture (routine x 2)     Status: Abnormal (Preliminary result)   Collection Time: 02/05/24  1:26 PM   Specimen: BLOOD  Result Value Ref Range Status   Specimen Description   Final    BLOOD BLOOD RIGHT HAND Performed at [facility], [address] [place], [place] [address]    Special Requests   Final    BOTTLES DRAWN AEROBIC AND ANAEROBIC Blood Culture adequate volume Performed at [facility], [address], [place], [place] [address]    Culture  Setup Time   Final    GRAM NEGATIVE RODS IN BOTH AEROBIC AND ANAEROBIC BOTTLES CRITICAL RESULT CALLED TO, READ BACK BY AND VERIFIED WITH: PHARMD J.LEGGE AT 0827 ON 02/06/2024 BY T.SAAD.    Culture (A)  Final    KLEBSIELLA PNEUMONIAE SUSCEPTIBILITIES TO FOLLOW Performed at [facility], [address], [place], [place] [address]    Report Status PENDING  Incomplete  Blood Culture ID Panel (Reflexed)     Status: Abnormal   Collection Time: 02/05/24  1:26 PM  Result Value Ref Range Status   Enterococcus faecalis NOT DETECTED NOT DETECTED Final   Enterococcus Faecium NOT DETECTED NOT DETECTED Final   Listeria monocytogenes NOT DETECTED NOT DETECTED Final   Staphylococcus species NOT DETECTED NOT DETECTED Final   Staphylococcus aureus (BCID) NOT DETECTED NOT DETECTED Final   Staphylococcus epidermidis NOT DETECTED NOT DETECTED Final   Staphylococcus lugdunensis NOT DETECTED NOT DETECTED Final   Streptococcus species NOT DETECTED NOT DETECTED Final   Streptococcus agalactiae NOT DETECTED NOT DETECTED Final   Streptococcus pneumoniae NOT DETECTED  NOT DETECTED Final   Streptococcus pyogenes NOT DETECTED NOT DETECTED Final   A.calcoaceticus-baumannii NOT DETECTED NOT DETECTED Final   Bacteroides fragilis NOT DETECTED NOT DETECTED Final   Enterobacterales DETECTED (A) NOT DETECTED Final    Comment: Enterobacterales represent a large order of gram negative bacteria, not a single organism. CRITICAL RESULT CALLED TO, READ BACK BY AND VERIFIED WITH:  PHARMD J.LEGGE AT 0827 ON 02/06/2024 BY T.SAAD.    Enterobacter cloacae complex NOT DETECTED NOT DETECTED Final   Escherichia coli NOT DETECTED NOT DETECTED Final   Klebsiella aerogenes NOT DETECTED NOT DETECTED Final   Klebsiella oxytoca NOT DETECTED NOT DETECTED Final   Klebsiella pneumoniae DETECTED (A) NOT DETECTED Final    Comment: CRITICAL RESULT CALLED TO, READ BACK BY AND VERIFIED WITH: PHARMD J.LEGGE AT 0827 ON 02/06/2024 BY T.SAAD.    Proteus species NOT DETECTED NOT DETECTED Final   Salmonella species NOT DETECTED NOT DETECTED Final   Serratia marcescens NOT DETECTED NOT DETECTED Final   Haemophilus influenzae NOT DETECTED NOT DETECTED Final   Neisseria meningitidis NOT DETECTED NOT DETECTED Final   Pseudomonas aeruginosa NOT DETECTED NOT DETECTED Final   Stenotrophomonas maltophilia NOT DETECTED NOT DETECTED Final   Candida albicans NOT DETECTED NOT DETECTED Final   Candida auris NOT DETECTED NOT DETECTED Final   Candida glabrata NOT DETECTED NOT DETECTED Final   Candida krusei NOT DETECTED NOT DETECTED Final   Candida parapsilosis NOT DETECTED NOT DETECTED Final   Candida tropicalis NOT DETECTED NOT DETECTED Final   Cryptococcus neoformans/gattii NOT DETECTED NOT DETECTED Final   CTX-M ESBL NOT DETECTED NOT DETECTED Final   Carbapenem resistance IMP NOT DETECTED NOT DETECTED Final   Carbapenem resistance KPC NOT DETECTED NOT DETECTED Final   Carbapenem resistance NDM NOT DETECTED NOT DETECTED Final   Carbapenem resist OXA 48 LIKE NOT DETECTED NOT DETECTED Final    Carbapenem resistance VIM NOT DETECTED NOT DETECTED Final    Comment: Performed at [facility], [address] [place], [place] [address]     Radiology Studies: CT ABDOMEN PELVIS WO CONTRAST Result Date: 02/05/2024 CLINICAL DATA:  Acute abdominal pain EXAM: CT ABDOMEN AND PELVIS WITHOUT CONTRAST TECHNIQUE: Multidetector CT imaging of the abdomen and pelvis was performed following the standard protocol without IV contrast. RADIATION DOSE REDUCTION: This exam was performed according to the departmental dose-optimization program which includes automated exposure control, adjustment of the mA and/or kV according to patient size and/or use of iterative reconstruction technique. COMPARISON:  CT abdomen and pelvis 12/02/2023. FINDINGS: Lower chest: No acute abnormality. Hepatobiliary: No focal liver abnormality is seen. No gallstones, gallbladder wall thickening, or biliary dilatation. Pancreas: Unremarkable. No pancreatic ductal dilatation or surrounding inflammatory changes. Spleen: Normal in size without focal abnormality. Adrenals/Urinary Tract: There is severe right and moderate left hydroureteronephrosis to the level of the bladder. No obstructing calculi are seen. There is bilateral perinephric fat stranding. Adrenal glands appear normal. The bladder is distended. There is bladder wall trabeculation and mild thickening. There some questionable mild hyperdensity layering within the dependent portion of the bladder. Foley catheter is malpositioned with balloon in the penile urethra. Stomach/Bowel: Stomach is within normal limits. Appendix appears normal. No evidence of bowel wall thickening, distention, or inflammatory changes. Right-sided ostomy present. Vascular/Lymphatic: Aortic atherosclerosis. No enlarged abdominal or pelvic lymph nodes. Reproductive: Prostate gland is enlarged with some nodular protrusion into the bladder base. Other: There is a peristomal hernia containing nondilated bowel. There  some skin thickening of the right anterior abdominal wall. There is no focal abdominal wall fluid collection. There is no ascites. Musculoskeletal: Degenerative changes affect the spine. IMPRESSION: 1. Foley catheter is malpositioned with balloon in the penile urethra. 2. Severe right and moderate left hydroureteronephrosis to the level of the bladder. No obstructing calculi are seen. Findings are concerning for bladder outlet obstruction. 3. Bladder wall trabeculation and mild thickening.  Findings may be related to chronic outlet obstruction. Correlate clinically for cystitis. 4. Questionable mild hyperdensity layering within the dependent portion of the bladder. Findings may represent blood products or debris. 5. Right-sided ostomy with peristomal hernia containing nondilated bowel. 6. Skin thickening of the right anterior abdominal wall. Correlate clinically for cellulitis. 7. Aortic atherosclerosis. Aortic Atherosclerosis (ICD10-I70.0). Electronically Signed   By: Greig Pique M.D.   On: 02/05/2024 18:42   DG Chest Port 1 View Result Date: 02/05/2024 CLINICAL DATA:  Sepsis. EXAM: PORTABLE CHEST 1 VIEW COMPARISON:  January 09, 2024. FINDINGS: The heart size and mediastinal contours are within normal limits. Calcified granuloma seen in right lower lobe. No acute pulmonary disease. Severe degenerative changes seen involving left glenohumeral joint. IMPRESSION: No active disease. Electronically Signed   By: Lynwood Landy Raddle M.D.   On: 02/05/2024 13:53    Scheduled Meds:  ARIPiprazole   2 mg Oral Daily   busPIRone   10 mg Oral TID   carvedilol   6.25 mg Oral BID WC   Chlorhexidine  Gluconate Cloth  6 each Topical Daily   finasteride   5 mg Oral Daily   heparin   5,000 Units Subcutaneous Q8H   loperamide   2 mg Oral q12n4p   mirtazapine   30 mg Oral QHS   polycarbophil  625 mg Oral BID   pravastatin   20 mg Oral Daily   primidone   50 mg Oral BID   tamsulosin   0.4 mg Oral Daily   traZODone   100 mg Oral QHS    venlafaxine  XR  225 mg Oral Daily   Continuous Infusions:  sodium chloride      cefTRIAXone  (ROCEPHIN )  IV 2 g (02/07/24 0856)   metronidazole  500 mg (02/07/24 0855)     LOS: 2 days   Ivonne Mustache, MD [facility] P9/10/2023, 10:52 AM

## 2024-02-08 DIAGNOSIS — N179 Acute kidney failure, unspecified: Secondary | ICD-10-CM | POA: Diagnosis not present

## 2024-02-08 DIAGNOSIS — N189 Chronic kidney disease, unspecified: Secondary | ICD-10-CM | POA: Diagnosis not present

## 2024-02-08 LAB — BASIC METABOLIC PANEL WITH GFR
Anion gap: 11 (ref 5–15)
BUN: 26 mg/dL — ABNORMAL HIGH (ref 8–23)
CO2: 17 mmol/L — ABNORMAL LOW (ref 22–32)
Calcium: 8.5 mg/dL — ABNORMAL LOW (ref 8.9–10.3)
Chloride: 108 mmol/L (ref 98–111)
Creatinine, Ser: 1.63 mg/dL — ABNORMAL HIGH (ref 0.61–1.24)
GFR, Estimated: 44 mL/min — ABNORMAL LOW (ref >60)
Glucose, Bld: 100 mg/dL — ABNORMAL HIGH (ref 70–99)
Potassium: 4.3 mmol/L (ref 3.5–5.1)
Sodium: 135 mmol/L (ref 135–145)

## 2024-02-08 LAB — CBC
HCT: 26.9 % — ABNORMAL LOW (ref 39.0–52.0)
Hemoglobin: 8 g/dL — ABNORMAL LOW (ref 13.0–17.0)
MCH: 27.2 pg (ref 26.0–34.0)
MCHC: 29.7 g/dL — ABNORMAL LOW (ref 30.0–36.0)
MCV: 91.5 fL (ref 80.0–100.0)
Platelets: 172 K/uL (ref 150–400)
RBC: 2.94 MIL/uL — ABNORMAL LOW (ref 4.22–5.81)
RDW: 15.3 % (ref 11.5–15.5)
WBC: 5.8 K/uL (ref 4.0–10.5)
nRBC: 0 % (ref 0.0–0.2)

## 2024-02-08 LAB — URINE CULTURE

## 2024-02-08 LAB — CULTURE, BLOOD (ROUTINE X 2)
Special Requests: ADEQUATE
Special Requests: ADEQUATE

## 2024-02-08 LAB — VITAMIN D 25 HYDROXY (VIT D DEFICIENCY, FRACTURES): Vit D, 25-Hydroxy: 33.94 ng/mL (ref 30–100)

## 2024-02-08 MED ORDER — AMOXICILLIN-POT CLAVULANATE 875-125 MG PO TABS
1.0000 | ORAL_TABLET | Freq: Two times a day (BID) | ORAL | Status: DC
  Administered 2024-02-08 – 2024-02-09 (×2): 1 via ORAL
  Filled 2024-02-08 (×2): qty 1

## 2024-02-08 MED ORDER — SODIUM BICARBONATE 650 MG PO TABS
650.0000 mg | ORAL_TABLET | Freq: Three times a day (TID) | ORAL | Status: DC
  Administered 2024-02-08 – 2024-02-09 (×4): 650 mg via ORAL
  Filled 2024-02-08 (×4): qty 1

## 2024-02-08 NOTE — Progress Notes (Signed)
 Mobility Specialist - Progress Note   02/08/24 1141  Mobility  Activity Ambulated with assistance  Level of Assistance Contact guard assist, steadying assist  Assistive Device Front wheel walker  Range of Motion/Exercises Active  Activity Response Tolerated well  Mobility Referral Yes  Mobility visit 1 Mobility  Mobility Specialist Start Time (ACUTE ONLY) 1130  Mobility Specialist Stop Time (ACUTE ONLY) 1141  Mobility Specialist Time Calculation (min) (ACUTE ONLY) 11 min   Pt was found in bed and agreeable to ambulate. C/o weakness. At EOS returned to recliner chair with all needs met. Call bell in reach and chair alarm on. RN notified.   Erminio Leos,  Mobility Specialist Can be reached via Secure Chat

## 2024-02-08 NOTE — Progress Notes (Signed)
 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  Patient and his family had difficulty to apply the bag.  Also reported generalized weakness, decreased oral intake.  When EMS arrived to pick him up, his ileostomy bag was off.  Appeared to have cloudy urine in the Foley bag.  On presentation, he was slightly tachycardic with a stable blood pressure.  Lab work showed creatinine of 3.1, he has baseline creatinine ranges from 1.3-1.4.  UA suspicious for UTI.  Chest x-ray did not show any acute disease.  Started on antibiotics.  Blood cultures showed Klebsiella pneumoniae.  Kidney function improving.  PT recommended SNF on discharge.  Medically stable for discharge whenever possible  Assessment & Plan:  Principal Problem:   Acute kidney injury superimposed on chronic kidney disease (HCC) Active Problems:   OSA treated with BiPAP   Anemia of chronic disease   Chronic kidney disease, stage 3a (HCC)   Bipolar affective disorder, depressed (HCC)   BPH (benign prostatic hyperplasia)   Dementia (HCC)   Colovesical fistula   Hypertension   Recurrent UTI   Chronic HFrEF (heart failure with reduced ejection fraction) (HCC)   Chronic indwelling Foley catheter   Ileostomy in place [facility])   Diverticulitis of sigmoid colon s/p robotic colectomy 01/09/2024    Acute kidney injury (HCC)    Sepsis secondary to UTI/gram-negative bacteremia: History of recurrent UTI with Pseudomonas, Enterococcus, Candida, E. coli. No fever or leukocytosis.  Developed fever of 101.4 on 02/05/2024.  UA suspicious for UTI with large leukocytes.  Blood culture showing Klebsiella.  Antibiotic changed to oral.  AKI on CKD stage IIIa: Baseline creatinine of 1.3-1.4.  Presented with creatinine in the range of 3.  Likely from dehydration from large output from ileostomy.  Kidney function improving and now close to baseline creatinine.  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  Continue gentle IV fluid for today.  Losartan  held for AKI.  Dose of Coreg  decreased.  Continue to monitor daily output, input  Anemia of chronic disease: Hb in the range of 7-8 .likely from slow blood loss from ileostomy.  No gross bloody bowel movement, melena or hematochezia.  Hemoglobin in the range of 8 today.  Iron  studies showed low iron , given IV iron  on 9/5  Hyponatremia: History of chronic hyponatremia but could also be yesterday with dehydration, GI fluid loss.Improving  Bipolar disorder/anxiety/depression/dementia: Home gabapentin  on hold.  Continue delirium precaution , frequent reorientation.  Patient is intermittently confused.  Debility/weakness: PT/OT  consulted.  Recently discharged from facility to home.  PT recommended SNF on discharge  Nutrition Problem: Moderate Malnutrition Etiology:  chronic illness    DVT prophylaxis:heparin  injection 5,000 Units Start: 02/06/24 2300 Place TED hose Start: 02/05/24 1627     Code Status: Full Code  Family Communication: Called spouse,[name] , and discussed about management plan on 9/5  Patient status:Inpatient  Patient is from :Home  Anticipated discharge to:SNF  Estimated DC date: Whenever possible   Consultants: None  Procedures:None  Antimicrobials:  Anti-infectives (From admission, onward)    Start     Dose/Rate Route Frequency Ordered Stop   02/08/24 1800  amoxicillin -clavulanate (AUGMENTIN ) 875-125 MG per tablet 1 tablet        1 tablet Oral Every 12 hours 02/08/24 1105 02/15/24 0959   02/06/24 1000  cefTRIAXone  (ROCEPHIN ) 2 g in sodium chloride  0.9 % 100 mL IVPB  Status:  Discontinued        2 g 200 mL/hr over 30 Minutes Intravenous Every 24 hours 02/06/24 0844 02/08/24 1104   02/06/24 1000  metroNIDAZOLE  (FLAGYL ) IVPB 500 mg  Status:  Discontinued        500 mg 100 mL/hr over 60 Minutes Intravenous Every 12 hours 02/06/24 0844 02/08/24 0738   02/05/24 2200  piperacillin -tazobactam (ZOSYN ) IVPB 3.375 g  Status:  Discontinued        3.375 g 12.5 mL/hr over 240 Minutes Intravenous Every 8 hours 02/05/24 1652 02/06/24 0844   02/05/24 1230  ceFEPIme  (MAXIPIME ) 2 g in sodium chloride  0.9 % 100 mL IVPB        2 g 200 mL/hr over 30 Minutes Intravenous  Once 02/05/24 1216 02/05/24 1358   02/05/24 1230  metroNIDAZOLE  (FLAGYL ) IVPB 500 mg        500 mg 100 mL/hr over 60 Minutes Intravenous  Once 02/05/24 1216 02/05/24 1455   02/05/24 1230  vancomycin  (VANCOCIN ) IVPB 1000 mg/200 mL premix        1,000 mg 200 mL/hr over 60 Minutes Intravenous  Once 02/05/24 1216 02/05/24 1558       Subjective: Patient seen and examined at bedside today.  Very comfortable this morning.  Sitting on the bed, eating his breakfast.Denies abdomen, nausea or vomiting.  Objective: Vitals:   02/07/24 2148 02/08/24 0435 02/08/24 0500  02/08/24 0937  BP: 116/61 117/66  (!) 133/112  Pulse: 80 82  82  Resp: 18 20  18   Temp: 98.3 F (36.8 C) 98.3 F (36.8 C)  98.4 F (36.9 C)  TempSrc:  Oral  Oral  SpO2: 97% 97%  99%  Weight:   100.3 kg   Height:        Intake/Output Summary (Last 24 hours) at 02/08/2024 1146 Last data filed at 02/08/2024 1002 Gross per 24 hour  Intake 380 ml  Output 9025 ml  Net -8645 ml   Filed Weights   02/06/24 0500 02/07/24 0500 02/08/24 0500  Weight: 97.7 kg 99.2 kg 100.3 kg    Examination:  General exam: Overall comfortable, not in distress, sitting on bed, eating breakfast HEENT: PERRL Respiratory system:  no wheezes or crackles  Cardiovascular system: S1 & S2 heard, RRR.  Gastrointestinal system: Abdomen is nondistended, soft and nontender.  Ileostomy with semisolid stool Central nervous system: Alert and oriented Extremities: No edema, no clubbing ,no cyanosis Skin: No rashes, no ulcers,no icterus GU: Foley   Data Reviewed: I have personally reviewed following labs and imaging studies  CBC: Recent Labs  Lab 02/05/24 1326 02/05/24 1923 02/06/24 0511 02/07/24 0420 02/08/24 0539  WBC 8.8 8.5 7.5 5.4 5.8  NEUTROABS 7.2  --   --   --   --   HGB 8.9* 8.6* 8.2* 7.5* 8.0*  HCT 30.5* 27.8* 27.7* 26.0* 26.9*  MCV 90.2 87.1 89.6 90.6 91.5  PLT 136* 128* 141* 149* 172   Basic Metabolic Panel: Recent Labs  Lab 02/05/24 1326 02/05/24 1923 02/06/24 0511 02/07/24 0420 02/08/24 0539  NA 129*  --  131* 131* 135  K 5.0  --  4.7 4.2 4.3  CL 98  --  101 105 108  CO2 15*  --  14* 14* 17*  GLUCOSE 132*  --  122* 109* 100*  BUN 52*  --  55* 40* 26*  CREATININE 3.16* 3.00* 3.13* 2.22* 1.63*  CALCIUM  9.1  --  8.8* 8.2* 8.5*     Recent Results (from the past 240 hours)  Urine Culture (for pregnant, neutropenic or urologic patients or patients with an indwelling urinary catheter)     Status: Abnormal   Collection Time: 02/05/24  8:06 AM   Specimen: Urine, Clean Catch  Result  Value Ref Range Status   Specimen Description   Final    URINE, CLEAN CATCH Performed at [facility], [address] [place], [place] [address]    Special Requests   Final    NONE Performed at [facility], [address], [place], [place] [address]    Culture   Final    Two isolates with different morphologies were identified as the same organism.The most resistant organism was reported. >=100,000 COLONIES/mL KLEBSIELLA PNEUMONIAE    Report Status 02/08/2024 FINAL  Final   Organism ID, Bacteria KLEBSIELLA PNEUMONIAE (A)  Final      Susceptibility   Klebsiella pneumoniae - MIC*    AMPICILLIN  RESISTANT Resistant     CEFAZOLIN (URINE) Value in next row Resistant      >=32 RESISTANTThis is a modified FDA-approved test that has been validated and its performance characteristics determined by the reporting laboratory.  This laboratory is certified under the Clinical Laboratory Improvement Amendments CLIA as qualified to perform high complexity clinical laboratory testing.    CEFEPIME  Value in next row Sensitive      >=32 RESISTANTThis is a modified FDA-approved test that has been validated and its performance characteristics determined by the reporting laboratory.  This laboratory is certified under the Clinical Laboratory Improvement Amendments CLIA as qualified to perform high complexity clinical laboratory testing.    ERTAPENEM Value in next row Sensitive      >=32 RESISTANTThis is a modified FDA-approved test that has been validated and its performance characteristics determined by the reporting laboratory.  This laboratory is certified under the Clinical Laboratory Improvement Amendments CLIA as qualified to perform high complexity clinical laboratory testing.    CEFTRIAXONE  Value in next row Sensitive      >=32 RESISTANTThis is a modified FDA-approved test that has been validated and its performance characteristics determined by the reporting  laboratory.  This laboratory is certified under the Clinical Laboratory Improvement Amendments CLIA as qualified to perform high complexity clinical laboratory testing.    CIPROFLOXACIN Value in next row Sensitive      >=32 RESISTANTThis is a modified FDA-approved test that has been validated and its performance characteristics determined by the reporting laboratory.  This laboratory is certified under the Clinical Laboratory Improvement Amendments CLIA as qualified to perform high complexity clinical laboratory testing.    GENTAMICIN Value in next row Sensitive      >=32 RESISTANTThis is a modified  FDA-approved test that has been validated and its performance characteristics determined by the reporting laboratory.  This laboratory is certified under the Clinical Laboratory Improvement Amendments CLIA as qualified to perform high complexity clinical laboratory testing.    NITROFURANTOIN Value in next row Intermediate      >=32 RESISTANTThis is a modified FDA-approved test that has been validated and its performance characteristics determined by the reporting laboratory.  This laboratory is certified under the Clinical Laboratory Improvement Amendments CLIA as qualified to perform high complexity clinical laboratory testing.    TRIMETH/SULFA Value in next row Sensitive      >=32 RESISTANTThis is a modified FDA-approved test that has been validated and its performance characteristics determined by the reporting laboratory.  This laboratory is certified under the Clinical Laboratory Improvement Amendments CLIA as qualified to perform high complexity clinical laboratory testing.    AMPICILLIN /SULBACTAM Value in next row Sensitive      >=32 RESISTANTThis is a modified FDA-approved test that has been validated and its performance characteristics determined by the reporting laboratory.  This laboratory is certified under the Clinical Laboratory Improvement Amendments CLIA as qualified to perform high complexity  clinical laboratory testing.    PIP/TAZO Value in next row Sensitive ug/mL     <=4 SENSITIVEThis is a modified FDA-approved test that has been validated and its performance characteristics determined by the reporting laboratory.  This laboratory is certified under the Clinical Laboratory Improvement Amendments CLIA as qualified to perform high complexity clinical laboratory testing.    MEROPENEM Value in next row Sensitive      <=4 SENSITIVEThis is a modified FDA-approved test that has been validated and its performance characteristics determined by the reporting laboratory.  This laboratory is certified under the Clinical Laboratory Improvement Amendments CLIA as qualified to perform high complexity clinical laboratory testing.    * >=100,000 COLONIES/mL KLEBSIELLA PNEUMONIAE  Blood Culture (routine x 2)     Status: Abnormal   Collection Time: 02/05/24  1:26 PM   Specimen: BLOOD  Result Value Ref Range Status   Specimen Description   Final    BLOOD RIGHT ANTECUBITAL Performed at [facility], [address], [place], [place] [address]    Special Requests   Final    BOTTLES DRAWN AEROBIC AND ANAEROBIC Blood Culture adequate volume Performed at [facility], [address], [place], [place] [address]    Culture  Setup Time   Final    GRAM NEGATIVE RODS IN BOTH AEROBIC AND ANAEROBIC BOTTLES CRITICAL VALUE NOTED.  VALUE IS CONSISTENT WITH PREVIOUSLY REPORTED AND CALLED VALUE.    Culture (A)  Final    KLEBSIELLA PNEUMONIAE SUSCEPTIBILITIES PERFORMED ON PREVIOUS CULTURE WITHIN THE LAST 5 DAYS. Performed at [facility], [address] [place], [place] [address]    Report Status 02/08/2024 FINAL  Final  Blood Culture (routine x 2)     Status: Abnormal   Collection Time: 02/05/24  1:26 PM   Specimen: BLOOD  Result Value Ref Range Status   Specimen Description   Final    BLOOD BLOOD RIGHT HAND Performed at [facility], [address], [place], [place] [address]    Special Requests   Final    BOTTLES DRAWN AEROBIC AND ANAEROBIC Blood Culture adequate volume Performed at [facility], [address], [place], [place] [address]    Culture  Setup Time   Final    GRAM NEGATIVE RODS IN BOTH AEROBIC AND ANAEROBIC  BOTTLES CRITICAL RESULT CALLED TO, READ BACK BY AND VERIFIED WITH: PHARMD J.LEGGE AT 0827 ON 02/06/2024 BY T.SAAD. Performed at [facility], [address], [place], [place] [address]    Culture KLEBSIELLA PNEUMONIAE (A)  Final   Report Status 02/08/2024 FINAL  Final   Organism ID, Bacteria KLEBSIELLA PNEUMONIAE  Final      Susceptibility   Klebsiella pneumoniae - MIC*    AMPICILLIN  >=32 RESISTANT Resistant     CEFAZOLIN (NON-URINE) 2 SENSITIVE Sensitive     CEFEPIME  <=0.12 SENSITIVE Sensitive     ERTAPENEM <=0.12 SENSITIVE Sensitive     CEFTRIAXONE  <=0.25 SENSITIVE Sensitive     CIPROFLOXACIN <=0.06 SENSITIVE Sensitive     GENTAMICIN <=1 SENSITIVE Sensitive     MEROPENEM <=0.25 SENSITIVE Sensitive     TRIMETH/SULFA <=20 SENSITIVE Sensitive     AMPICILLIN /SULBACTAM 4 SENSITIVE Sensitive     PIP/TAZO Value in next row Sensitive ug/mL     <=4 SENSITIVEThis is a modified FDA-approved test that has been validated and its performance characteristics determined by the reporting laboratory.  This laboratory is certified under the Clinical Laboratory Improvement Amendments CLIA as qualified to perform high complexity clinical laboratory testing.    * KLEBSIELLA PNEUMONIAE  Blood Culture ID Panel (Reflexed)     Status: Abnormal   Collection Time: 02/05/24  1:26 PM  Result Value Ref Range Status   Enterococcus faecalis NOT DETECTED NOT DETECTED Final   Enterococcus Faecium NOT DETECTED NOT DETECTED Final   Listeria monocytogenes NOT DETECTED NOT DETECTED Final   Staphylococcus species NOT DETECTED NOT DETECTED Final   Staphylococcus aureus (BCID) NOT DETECTED NOT DETECTED Final    Staphylococcus epidermidis NOT DETECTED NOT DETECTED Final   Staphylococcus lugdunensis NOT DETECTED NOT DETECTED Final   Streptococcus species NOT DETECTED NOT DETECTED Final   Streptococcus agalactiae NOT DETECTED NOT DETECTED Final   Streptococcus pneumoniae NOT DETECTED NOT DETECTED Final   Streptococcus pyogenes NOT DETECTED NOT DETECTED Final   A.calcoaceticus-baumannii NOT DETECTED NOT DETECTED Final   Bacteroides fragilis NOT DETECTED NOT DETECTED Final   Enterobacterales DETECTED (A) NOT DETECTED Final    Comment: Enterobacterales represent a large order of gram negative bacteria, not a single organism. CRITICAL RESULT CALLED TO, READ BACK BY AND VERIFIED WITH: PHARMD J.LEGGE AT 0827 ON 02/06/2024 BY T.SAAD.    Enterobacter cloacae complex NOT DETECTED NOT DETECTED Final   Escherichia coli NOT DETECTED NOT DETECTED Final   Klebsiella aerogenes NOT DETECTED NOT DETECTED Final   Klebsiella oxytoca NOT DETECTED NOT DETECTED Final   Klebsiella pneumoniae DETECTED (A) NOT DETECTED Final    Comment: CRITICAL RESULT CALLED TO, READ BACK BY AND VERIFIED WITH: PHARMD J.LEGGE AT 0827 ON 02/06/2024 BY T.SAAD.    Proteus species NOT DETECTED NOT DETECTED Final   Salmonella species NOT DETECTED NOT DETECTED Final   Serratia marcescens NOT DETECTED NOT DETECTED Final   Haemophilus influenzae NOT DETECTED NOT DETECTED Final   Neisseria meningitidis NOT DETECTED NOT DETECTED Final   Pseudomonas aeruginosa NOT DETECTED NOT DETECTED Final   Stenotrophomonas maltophilia NOT DETECTED NOT DETECTED Final   Candida albicans NOT DETECTED NOT DETECTED Final   Candida auris NOT DETECTED NOT DETECTED Final   Candida glabrata NOT DETECTED NOT DETECTED Final   Candida krusei NOT DETECTED NOT DETECTED Final   Candida parapsilosis NOT DETECTED NOT DETECTED Final   Candida tropicalis NOT DETECTED NOT DETECTED Final   Cryptococcus neoformans/gattii NOT DETECTED NOT DETECTED Final   CTX-M ESBL NOT  DETECTED  NOT DETECTED Final   Carbapenem resistance IMP NOT DETECTED NOT DETECTED Final   Carbapenem resistance KPC NOT DETECTED NOT DETECTED Final   Carbapenem resistance NDM NOT DETECTED NOT DETECTED Final   Carbapenem resist OXA 48 LIKE NOT DETECTED NOT DETECTED Final   Carbapenem resistance VIM NOT DETECTED NOT DETECTED Final    Comment: Performed at [facility], [address] [place], [place] [address]     Radiology Studies: No results found.   Scheduled Meds:  amoxicillin -clavulanate  1 tablet Oral Q12H   ARIPiprazole   2 mg Oral Daily   busPIRone   10 mg Oral TID   carvedilol   6.25 mg Oral BID WC   Chlorhexidine  Gluconate Cloth  6 each Topical Daily   feeding supplement  237 mL Oral TID BM   finasteride   5 mg Oral Daily   heparin   5,000 Units Subcutaneous Q8H   loperamide   2 mg Oral q12n4p   mirtazapine   30 mg Oral QHS   multivitamin with minerals  1 tablet Oral Daily   polycarbophil  625 mg Oral BID   pravastatin   20 mg Oral Daily   primidone   50 mg Oral BID   sodium bicarbonate   650 mg Oral TID   tamsulosin   0.4 mg Oral Daily   traZODone   100 mg Oral QHS   venlafaxine  XR  225 mg Oral Daily   Continuous Infusions:  sodium chloride  75 mL/hr at 02/08/24 0040     LOS: 3 days   Ivonne Mustache, MD [facility] P9/11/2023, 11:46 AM

## 2024-02-08 NOTE — Plan of Care (Signed)
   Problem: Education: Goal: Knowledge of General Education information will improve Description Including pain rating scale, medication(s)/side effects and non-pharmacologic comfort measures Outcome: Progressing

## 2024-02-09 DIAGNOSIS — K219 Gastro-esophageal reflux disease without esophagitis: Secondary | ICD-10-CM | POA: Diagnosis not present

## 2024-02-09 DIAGNOSIS — N179 Acute kidney failure, unspecified: Secondary | ICD-10-CM | POA: Diagnosis not present

## 2024-02-09 DIAGNOSIS — R262 Difficulty in walking, not elsewhere classified: Secondary | ICD-10-CM | POA: Diagnosis not present

## 2024-02-09 DIAGNOSIS — R6520 Severe sepsis without septic shock: Secondary | ICD-10-CM | POA: Diagnosis not present

## 2024-02-09 DIAGNOSIS — Z7401 Bed confinement status: Secondary | ICD-10-CM | POA: Diagnosis not present

## 2024-02-09 DIAGNOSIS — N390 Urinary tract infection, site not specified: Secondary | ICD-10-CM | POA: Diagnosis not present

## 2024-02-09 DIAGNOSIS — A419 Sepsis, unspecified organism: Secondary | ICD-10-CM | POA: Diagnosis not present

## 2024-02-09 DIAGNOSIS — N189 Chronic kidney disease, unspecified: Secondary | ICD-10-CM | POA: Diagnosis not present

## 2024-02-09 DIAGNOSIS — N400 Enlarged prostate without lower urinary tract symptoms: Secondary | ICD-10-CM | POA: Diagnosis not present

## 2024-02-09 DIAGNOSIS — R41841 Cognitive communication deficit: Secondary | ICD-10-CM | POA: Diagnosis not present

## 2024-02-09 DIAGNOSIS — E785 Hyperlipidemia, unspecified: Secondary | ICD-10-CM | POA: Diagnosis not present

## 2024-02-09 DIAGNOSIS — E8729 Other acidosis: Secondary | ICD-10-CM | POA: Diagnosis not present

## 2024-02-09 DIAGNOSIS — D638 Anemia in other chronic diseases classified elsewhere: Secondary | ICD-10-CM | POA: Diagnosis not present

## 2024-02-09 DIAGNOSIS — E871 Hypo-osmolality and hyponatremia: Secondary | ICD-10-CM | POA: Diagnosis not present

## 2024-02-09 DIAGNOSIS — N1831 Chronic kidney disease, stage 3a: Secondary | ICD-10-CM | POA: Diagnosis not present

## 2024-02-09 DIAGNOSIS — T839XXD Unspecified complication of genitourinary prosthetic device, implant and graft, subsequent encounter: Secondary | ICD-10-CM | POA: Diagnosis not present

## 2024-02-09 DIAGNOSIS — N3000 Acute cystitis without hematuria: Secondary | ICD-10-CM | POA: Diagnosis not present

## 2024-02-09 DIAGNOSIS — N321 Vesicointestinal fistula: Secondary | ICD-10-CM | POA: Diagnosis not present

## 2024-02-09 DIAGNOSIS — M6281 Muscle weakness (generalized): Secondary | ICD-10-CM | POA: Diagnosis not present

## 2024-02-09 DIAGNOSIS — N139 Obstructive and reflux uropathy, unspecified: Secondary | ICD-10-CM | POA: Diagnosis not present

## 2024-02-09 DIAGNOSIS — R531 Weakness: Secondary | ICD-10-CM | POA: Diagnosis not present

## 2024-02-09 LAB — CBC
HCT: 28.7 % — ABNORMAL LOW (ref 39.0–52.0)
Hemoglobin: 8.3 g/dL — ABNORMAL LOW (ref 13.0–17.0)
MCH: 26.9 pg (ref 26.0–34.0)
MCHC: 28.9 g/dL — ABNORMAL LOW (ref 30.0–36.0)
MCV: 93.2 fL (ref 80.0–100.0)
Platelets: 187 K/uL (ref 150–400)
RBC: 3.08 MIL/uL — ABNORMAL LOW (ref 4.22–5.81)
RDW: 15.2 % (ref 11.5–15.5)
WBC: 6.8 K/uL (ref 4.0–10.5)
nRBC: 0 % (ref 0.0–0.2)

## 2024-02-09 LAB — BASIC METABOLIC PANEL WITH GFR
Anion gap: 11 (ref 5–15)
BUN: 23 mg/dL (ref 8–23)
CO2: 17 mmol/L — ABNORMAL LOW (ref 22–32)
Calcium: 8.5 mg/dL — ABNORMAL LOW (ref 8.9–10.3)
Chloride: 108 mmol/L (ref 98–111)
Creatinine, Ser: 1.49 mg/dL — ABNORMAL HIGH (ref 0.61–1.24)
GFR, Estimated: 49 mL/min — ABNORMAL LOW (ref >60)
Glucose, Bld: 105 mg/dL — ABNORMAL HIGH (ref 70–99)
Potassium: 4.4 mmol/L (ref 3.5–5.1)
Sodium: 137 mmol/L (ref 135–145)

## 2024-02-09 MED ORDER — HYDROCODONE-ACETAMINOPHEN 5-325 MG PO TABS: 1.0000 | ORAL_TABLET | Freq: Four times a day (QID) | ORAL | 0 refills | Status: DC | PRN

## 2024-02-09 MED ORDER — SODIUM BICARBONATE 650 MG PO TABS: 1300.0000 mg | ORAL_TABLET | Freq: Two times a day (BID) | ORAL | Status: DC

## 2024-02-09 MED ORDER — AMOXICILLIN-POT CLAVULANATE 875-125 MG PO TABS: 1.0000 | ORAL_TABLET | Freq: Two times a day (BID) | ORAL | 0 refills | Status: AC

## 2024-02-09 NOTE — Progress Notes (Signed)
 Attempted to call report to [facility] [place]. Message left to return call for report

## 2024-02-09 NOTE — TOC Progression Note (Signed)
 Transition of Care [facility]) - Progression Note    Patient Details  Name: Calvin Escobar MRN: [id number] Date of Birth: 1950-11-11  Transition of [facility]) CM/SW Contact  Heather DELENA Saltness, LCSW Phone Number: 02/09/2024, 10:04 AM  Clinical Narrative:    Pt's insurance authorization for short-term SNF rehab has been approved. Authorization ID:  [id number]. CSW notified Shona at [facility]. Awaiting follow up.   Expected Discharge Plan: Skilled Nursing Facility Barriers to Discharge: Continued Medical Work up  Expected Discharge Plan and Services   Discharge Planning Services: CM Consult Post Acute Care Choice: Skilled Nursing Facility Living arrangements for the past 2 months: Single Family Home                  Social Drivers of Health (SDOH) Interventions SDOH Screenings   Food Insecurity: No Food Insecurity (02/05/2024)  Housing: Low Risk  (02/05/2024)  Transportation Needs: No Transportation Needs (02/05/2024)  Utilities: Not At Risk (02/05/2024)  Alcohol Screen: Low Risk  (10/10/2023)  Depression (PHQ2-9): Low Risk  (12/09/2023)  Recent Concern: Depression (PHQ2-9) - Medium Risk (09/16/2023)  Financial Resource Strain: Low Risk  (10/10/2023)  Physical Activity: Sufficiently Active (10/10/2023)  Social Connections: Moderately Integrated (02/05/2024)  Recent Concern: Social Connections - Socially Isolated (12/11/2023)  Stress: No Stress Concern Present (10/10/2023)  Tobacco Use: Low Risk  (02/05/2024)  Health Literacy: Adequate Health Literacy (10/10/2023)    Readmission Risk Interventions    01/14/2024    2:09 PM 07/04/2023    1:27 PM 07/03/2023    2:48 PM  Readmission Risk Prevention Plan  Transportation Screening  Complete Complete  PCP or Specialist Appt within 5-7 Days  Complete Complete  Home Care Screening  Complete Complete  Medication Review (RN CM)  Complete Complete  Medication Review (RN Care Manager) Complete    PCP or Specialist appointment within 3-5 days of discharge Complete     HRI or Home Care Consult Complete    SW Recovery Care/Counseling Consult Complete    Palliative Care Screening Not Applicable    Skilled Nursing Facility Complete      Signed: Heather Saltness, MSW, LCSW Clinical Social Worker Inpatient Care Management 02/09/2024 10:05 AM

## 2024-02-09 NOTE — TOC Transition Note (Signed)
 Transition of Care [facility]) - Discharge Note   Patient Details  Name: Calvin Escobar MRN: [id number] Date of Birth: 1950/11/26  Transition of Care [facility] CM/SW Contact:  Heather DELENA Saltness, LCSW Phone Number: 02/09/2024, 10:45 AM   Clinical Narrative:    Pt discharging to [facility] today for short-term SNF rehab. Pt accepted to room 210. D/C packet placed in pt's chart at RN station. RN to call report to [place], [phone]. PTAR called at 11:19 AM. Patient and wife, [name], made aware and in agreement with discharge plan. No further TOC needs at this time.   Final next level of care: Skilled Nursing Facility Barriers to Discharge: Barriers Resolved   Patient Goals and CMS Choice Patient states their goals for this hospitalization and ongoing recovery are:: To go to [facility] for SNF rehab CMS Medicare.gov Compare Post Acute Care list provided to:: Patient Represenative (must comment) (pt's wife) Choice offered to / list presented to : Spouse [place] ownership interest in [facility].provided to:: Spouse    Discharge Placement  [facility]            Patient chooses bed at: [facility] Patient to be transferred to facility by: PTAR Name of family member notified: [name] Brier Patient and family notified of of transfer: 02/09/24  Discharge Plan and Services Additional resources added to the After Visit Summary for  Follow Up   Discharge Planning Services: CM Consult Post Acute Care Choice: Skilled Nursing Facility          DME Arranged: N/A DME Agency: NA       HH Arranged: NA HH Agency: NA        Social Drivers of Health (SDOH) Interventions SDOH Screenings   Food Insecurity: No Food Insecurity (02/05/2024)  Housing: Low Risk  (02/05/2024)  Transportation Needs: No Transportation Needs (02/05/2024)  Utilities: Not At Risk (02/05/2024)  Alcohol Screen: Low Risk  (10/10/2023)  Depression (PHQ2-9): Low Risk  (12/09/2023)  Recent  Concern: Depression (PHQ2-9) - Medium Risk (09/16/2023)  Financial Resource Strain: Low Risk  (10/10/2023)  Physical Activity: Sufficiently Active (10/10/2023)  Social Connections: Moderately Integrated (02/05/2024)  Recent Concern: Social Connections - Socially Isolated (12/11/2023)  Stress: No Stress Concern Present (10/10/2023)  Tobacco Use: Low Risk  (02/05/2024)  Health Literacy: Adequate Health Literacy (10/10/2023)     Readmission Risk Interventions    01/14/2024    2:09 PM 07/04/2023    1:27 PM 07/03/2023    2:48 PM  Readmission Risk Prevention Plan  Transportation Screening  Complete Complete  PCP or Specialist Appt within 5-7 Days  Complete Complete  Home Care Screening  Complete Complete  Medication Review (RN CM)  Complete Complete  Medication Review (RN Care Manager) Complete    PCP or Specialist appointment within 3-5 days of discharge Complete    HRI or Home Care Consult Complete    SW Recovery Care/Counseling Consult Complete    Palliative Care Screening Not Applicable    Skilled Nursing Facility Complete       Signed: Heather Saltness, MSW, LCSW Clinical Social Worker Inpatient Care Management 02/09/2024 11:22 AM

## 2024-02-09 NOTE — Discharge Summary (Signed)
 Physician Discharge Summary  Calvin Escobar FMW:[id number] DOB: [birth year] DOA: 02/05/2024  PCP: [name], MD  Admit date: 02/05/2024 Discharge date: 02/09/2024  Admitted From: Home Disposition:  SNF  Discharge Condition:Stable CODE STATUS:FULL Diet recommendation:  Regular   Brief/Interim Summary: Patient is a 72 year old male with recent hospitalization for colovesical fistula/sigmoid diverticulitis status post robotic rectosigmoid resection with anastomosis, colovesical fistula takedown and repair, diverting ileostomy, drainage of pelvic abscess and lysis of adhesions and was discharged to nursing facility on 8/12, urethral stricture with chronic indwelling Foley catheter, recurrent UTI/bacteremia, 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  Patient and his family had difficulty to apply the bag.  Also reported generalized weakness, decreased oral intake.  When EMS arrived to pick him up, his ileostomy bag was off.  Appeared to have cloudy urine in the Foley bag.  On presentation, he was slightly tachycardic with a stable blood pressure.  Lab work showed creatinine of 3.1, he has baseline creatinine ranges from 1.3-1.4.  UA suspicious for UTI.  Chest x-ray did not show any acute disease.  Started on antibiotics.  Blood cultures showed Klebsiella pneumoniae.  Kidney function improving.  PT recommended SNF on discharge.  Medically stable for discharge   Following problems were addressed during the hospitalization:  Sepsis secondary to UTI/gram-negative bacteremia: History of recurrent UTI with Pseudomonas, Enterococcus, Candida, E. coli. No fever or leukocytosis.  Developed fever of 101.4 on 02/05/2024.  UA suspicious for UTI with large leukocytes.  Blood culture showing Klebsiella.  Antibiotic changed to oral.   AKI on CKD stage IIIa: Baseline creatinine of  1.3-1.4.  Presented with creatinine in the range of 3.  Likely from dehydration from large output from ileostomy.  Kidney function improved and now close to baseline creatinine.   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  Losartan  held for AKI.  Dose of Coreg  decreased.   Anemia of chronic disease: Hb in the range of 7-8 .likely from slow blood loss from ileostomy.  No gross bloody bowel movement, melena or hematochezia.  Hemoglobin in the range of 8 today.  Iron  studies showed low iron , given IV iron  on 9/5.  Continue oral iron  supplementation   Hyponatremia: History of chronic hyponatremia but could also be yesterday with dehydration, GI fluid loss.Improved   Bipolar disorder/anxiety/depression/dementia: Takes gabapentin , mirtazapine , buspirone , abilify .   Debility/weakness: PT/OT  consulted.  Recently discharged from facility to home.  PT recommended SNF on discharge     Discharge Diagnoses:  Principal Problem:   Acute kidney injury superimposed on chronic kidney disease (HCC) Active Problems:   OSA treated with BiPAP   Anemia of chronic disease   Chronic kidney disease, stage 3a (HCC)   Bipolar affective disorder, depressed (HCC)   BPH (benign prostatic hyperplasia)   Dementia (HCC)   Colovesical fistula   Hypertension   Recurrent UTI   Chronic HFrEF (heart failure with reduced ejection fraction) (HCC)   Chronic indwelling Foley catheter   Ileostomy in place [facility])  Diverticulitis of  sigmoid colon s/p robotic colectomy 01/09/2024   Acute kidney injury [facility])    Discharge Instructions  Discharge Instructions     Diet general   Complete by: As directed    Discharge instructions   Complete by: As directed    1)Please take your medications as instructed 2)Do a CBC and BMP tests in a week   Increase activity slowly   Complete by: As directed       Allergies as of 02/09/2024       Reactions   Albuterol  Palpitations   Supraventricular Tachycardia         Medication List     STOP taking these medications    bisacodyl 5 MG EC tablet Generic drug: bisacodyl   losartan  50 MG tablet Commonly known as: COZAAR    melatonin 3 MG Tabs tablet   neomycin 500 MG tablet Commonly known as: MYCIFRADIN       TAKE these medications    amoxicillin -clavulanate 875-125 MG tablet Commonly known as: AUGMENTIN  Take 1 tablet by mouth every 12 (twelve) hours for 11 doses.   ARIPiprazole  2 MG tablet Commonly known as: ABILIFY  Take 1 tablet (2 mg total) by mouth in the morning.   busPIRone  10 MG tablet Commonly known as: BUSPAR  Take 1 tablet (10 mg total) by mouth 3 (three) times daily.   carvedilol  6.25 MG tablet Commonly known as: COREG  Take 6.25 mg by mouth 2 (two) times daily with a meal. What changed: Another medication with the same name was removed. Continue taking this medication, and follow the directions you see here.   ferrous sulfate  325 (65 FE) MG tablet Take 1 tablet (325 mg total) by mouth 2 (two) times daily with a meal.   finasteride  5 MG tablet Commonly known as: PROSCAR  Take 5 mg by mouth daily.   folic acid  1 MG tablet Commonly known as: FOLVITE  Take 1 mg by mouth daily.   gabapentin  300 MG capsule Commonly known as: NEURONTIN  Take 300 mg by mouth at bedtime.   HYDROcodone -acetaminophen  5-325 MG tablet Commonly known as: NORCO/VICODIN Take 1 tablet by mouth every 6 (six) hours as needed for moderate pain (pain score 4-6) or severe  pain (pain score 7-10). What changed: how much to take   loperamide  2 MG capsule Commonly known as: IMODIUM  Take 1 capsule (2 mg total) by mouth 2 (two) times daily.   loperamide  2 MG capsule Commonly known as: IMODIUM  Take 1-2 capsules (2-4 mg total) by mouth every 6 (six) hours as needed for diarrhea or loose stools (Use if >2 BM every 8 hours).   mirtazapine  30 MG tablet Commonly known as: REMERON  Take 1 tablet (30 mg total) by mouth at bedtime.   ondansetron  4 MG tablet Commonly known as: ZOFRAN  Take 4 mg by mouth every 8 (eight) hours as needed for nausea.   polycarbophil 625 MG tablet Commonly known as: FIBERCON Take 1 tablet (625 mg total) by mouth 2 (two) times daily.   pravastatin  20 MG tablet Commonly known as: PRAVACHOL  TAKE 1 TABLET BY MOUTH EVERYDAY AT BEDTIME What changed: See the new instructions.   primidone  50 MG tablet Commonly known as: MYSOLINE  Take 1 tablet (50 mg total) by mouth 2 (two) times daily.   sodium bicarbonate  650 MG tablet Take 2 tablets (1,300 mg total) by mouth 2 (two) times daily.   tamsulosin  0.4 MG Caps capsule Commonly known as: FLOMAX  TAKE 1 CAPSULE BY MOUTH EVERY DAY   traZODone  100 MG tablet  Commonly known as: DESYREL  Take 1 tablet (100 mg total) by mouth at bedtime.   venlafaxine  XR 150 MG 24 hr capsule Commonly known as: EFFEXOR -XR Take 150 mg by mouth daily.   venlafaxine  XR 75 MG 24 hr capsule Commonly known as: EFFEXOR -XR Take 75 mg by mouth daily.        Contact information for after-discharge care     Destination     [name]   Service: Skilled Nursing Contact information: [address] [place]  [address] [phone]                    Allergies  Allergen Reactions   Albuterol  Palpitations    Supraventricular Tachycardia     Consultations: None   Procedures/Studies: CT ABDOMEN PELVIS WO CONTRAST Result Date: 02/05/2024 CLINICAL  DATA:  Acute abdominal pain EXAM: CT ABDOMEN AND PELVIS WITHOUT CONTRAST TECHNIQUE: Multidetector CT imaging of the abdomen and pelvis was performed following the standard protocol without IV contrast. RADIATION DOSE REDUCTION: This exam was performed according to the departmental dose-optimization program which includes automated exposure control, adjustment of the mA and/or kV according to patient size and/or use of iterative reconstruction technique. COMPARISON:  CT abdomen and pelvis 12/02/2023. FINDINGS: Lower chest: No acute abnormality. Hepatobiliary: No focal liver abnormality is seen. No gallstones, gallbladder wall thickening, or biliary dilatation. Pancreas: Unremarkable. No pancreatic ductal dilatation or surrounding inflammatory changes. Spleen: Normal in size without focal abnormality. Adrenals/Urinary Tract: There is severe right and moderate left hydroureteronephrosis to the level of the bladder. No obstructing calculi are seen. There is bilateral perinephric fat stranding. Adrenal glands appear normal. The bladder is distended. There is bladder wall trabeculation and mild thickening. There some questionable mild hyperdensity layering within the dependent portion of the bladder. Foley catheter is malpositioned with balloon in the penile urethra. Stomach/Bowel: Stomach is within normal limits. Appendix appears normal. No evidence of bowel wall thickening, distention, or inflammatory changes. Right-sided ostomy present. Vascular/Lymphatic: Aortic atherosclerosis. No enlarged abdominal or pelvic lymph nodes. Reproductive: Prostate gland is enlarged with some nodular protrusion into the bladder base. Other: There is a peristomal hernia containing nondilated bowel. There some skin thickening of the right anterior abdominal wall. There is no focal abdominal wall fluid collection. There is no ascites. Musculoskeletal: Degenerative changes affect the spine. IMPRESSION: 1. Foley catheter is malpositioned with  balloon in the penile urethra. 2. Severe right and moderate left hydroureteronephrosis to the level of the bladder. No obstructing calculi are seen. Findings are concerning for bladder outlet obstruction. 3. Bladder wall trabeculation and mild thickening. Findings may be related to chronic outlet obstruction. Correlate clinically for cystitis. 4. Questionable mild hyperdensity layering within the dependent portion of the bladder. Findings may represent blood products or debris. 5. Right-sided ostomy with peristomal hernia containing nondilated bowel. 6. Skin thickening of the right anterior abdominal wall. Correlate clinically for cellulitis. 7. Aortic atherosclerosis. Aortic Atherosclerosis (ICD10-I70.0). Electronically Signed   By: Greig Pique M.D.   On: 02/05/2024 18:42   DG Chest Port 1 View Result Date: 02/05/2024 CLINICAL DATA:  Sepsis. EXAM: PORTABLE CHEST 1 VIEW COMPARISON:  January 09, 2024. FINDINGS: The heart size and mediastinal contours are within normal limits. Calcified granuloma seen in right lower lobe. No acute pulmonary disease. Severe degenerative changes seen involving left glenohumeral joint. IMPRESSION: No active disease. Electronically Signed   By: Lynwood Landy Raddle M.D.   On: 02/05/2024 13:53      Subjective: Patient seen and  examined at bedside today.  Hemodynamically stable.  Comfortable.  No abdomen pain, nausea or vomiting.  Afebrile.  Medically stable for discharge to SNF today.I called and discussed with his wife [name] about discharge planning  Discharge Exam: Vitals:   02/08/24 2040 02/09/24 0427  BP: 118/75 113/71  Pulse: 77 83  Resp: 20 15  Temp: 97.9 F (36.6 C) 98.6 F (37 C)  SpO2: 97% 96%   Vitals:   02/08/24 1732 02/08/24 2040 02/09/24 0427 02/09/24 0456  BP: 110/67 118/75 113/71   Pulse: 78 77 83   Resp:  20 15   Temp:  97.9 F (36.6 C) 98.6 F (37 C)   TempSrc:  Oral Oral   SpO2:  97% 96%   Weight:    100.3 kg  Height:        General: Pt is  alert, awake, not in acute distress Cardiovascular: RRR, S1/S2 +, no rubs, no gallops Respiratory: CTA bilaterally, no wheezing, no rhonchi Abdominal: Soft, NT, ND, bowel sounds +, ileostomy Extremities: no edema, no cyanosis GU : Foley catheter    The results of significant diagnostics from this hospitalization (including imaging, microbiology, ancillary and laboratory) are listed below for reference.     Microbiology: Recent Results (from the past 240 hours)  Urine Culture (for pregnant, neutropenic or urologic patients or patients with an indwelling urinary catheter)     Status: Abnormal   Collection Time: 02/05/24  8:06 AM   Specimen: Urine, Clean Catch  Result Value Ref Range Status   Specimen Description   Final    URINE, CLEAN CATCH Performed at [facility], [address], [place], [place] [address]    Special Requests   Final    NONE Performed at [name], [address] [place], [place] [address]    Culture   Final    Two isolates with different morphologies were identified as the same organism.The most resistant organism was reported. >=100,000 COLONIES/mL KLEBSIELLA PNEUMONIAE    Report Status 02/08/2024 FINAL  Final   Organism ID, Bacteria KLEBSIELLA PNEUMONIAE (A)  Final      Susceptibility   Klebsiella pneumoniae - MIC*    AMPICILLIN  RESISTANT Resistant     CEFAZOLIN (URINE) Value in next row Resistant      >=32 RESISTANTThis is a modified FDA-approved test that has been validated and its performance characteristics determined by the reporting laboratory.  This laboratory is certified under the Clinical Laboratory Improvement Amendments CLIA as qualified to perform high complexity clinical laboratory testing.    CEFEPIME  Value in next row Sensitive      >=32 RESISTANTThis is a modified FDA-approved test that has been validated and its performance characteristics determined by the reporting laboratory.  This laboratory is  certified under the Clinical Laboratory Improvement Amendments CLIA as qualified to perform high complexity clinical laboratory testing.    ERTAPENEM Value in next row Sensitive      >=32 RESISTANTThis is a modified FDA-approved test that has been validated and its performance characteristics determined by the reporting laboratory.  This laboratory is certified under the Clinical Laboratory Improvement Amendments CLIA as qualified to perform high complexity clinical laboratory testing.    CEFTRIAXONE  Value in next row Sensitive      >=32 RESISTANTThis is a modified FDA-approved test that has been validated and its performance characteristics determined by the reporting laboratory.  This laboratory is certified under the Clinical Laboratory Improvement Amendments CLIA as qualified to perform high complexity clinical  laboratory testing.    CIPROFLOXACIN Value in next row Sensitive      >=32 RESISTANTThis is a modified FDA-approved test that has been validated and its performance characteristics determined by the reporting laboratory.  This laboratory is certified under the Clinical Laboratory Improvement Amendments CLIA as qualified to perform high complexity clinical laboratory testing.    GENTAMICIN Value in next row Sensitive      >=32 RESISTANTThis is a modified FDA-approved test that has been validated and its performance characteristics determined by the reporting laboratory.  This laboratory is certified under the Clinical Laboratory Improvement Amendments CLIA as qualified to perform high complexity clinical laboratory testing.    NITROFURANTOIN Value in next row Intermediate      >=32 RESISTANTThis is a modified FDA-approved test that has been validated and its performance characteristics determined by the reporting laboratory.  This laboratory is certified under the Clinical Laboratory Improvement Amendments CLIA as qualified to perform high complexity clinical laboratory testing.    TRIMETH/SULFA  Value in next row Sensitive      >=32 RESISTANTThis is a modified FDA-approved test that has been validated and its performance characteristics determined by the reporting laboratory.  This laboratory is certified under the Clinical Laboratory Improvement Amendments CLIA as qualified to perform high complexity clinical laboratory testing.    AMPICILLIN /SULBACTAM Value in next row Sensitive      >=32 RESISTANTThis is a modified FDA-approved test that has been validated and its performance characteristics determined by the reporting laboratory.  This laboratory is certified under the Clinical Laboratory Improvement Amendments CLIA as qualified to perform high complexity clinical laboratory testing.    PIP/TAZO Value in next row Sensitive ug/mL     <=4 SENSITIVEThis is a modified FDA-approved test that has been validated and its performance characteristics determined by the reporting laboratory.  This laboratory is certified under the Clinical Laboratory Improvement Amendments CLIA as qualified to perform high complexity clinical laboratory testing.    MEROPENEM Value in next row Sensitive      <=4 SENSITIVEThis is a modified FDA-approved test that has been validated and its performance characteristics determined by the reporting laboratory.  This laboratory is certified under the Clinical Laboratory Improvement Amendments CLIA as qualified to perform high complexity clinical laboratory testing.    * >=100,000 COLONIES/mL KLEBSIELLA PNEUMONIAE  Blood Culture (routine x 2)     Status: Abnormal   Collection Time: 02/05/24  1:26 PM   Specimen: BLOOD  Result Value Ref Range Status   Specimen Description   Final    BLOOD RIGHT ANTECUBITAL Performed at [facility], [address], [place], [place] [address]    Special Requests   Final    BOTTLES DRAWN AEROBIC AND ANAEROBIC Blood Culture adequate volume Performed at [facility], [address] [place], [place]  [address]    Culture  Setup Time   Final    GRAM NEGATIVE RODS IN BOTH AEROBIC AND ANAEROBIC BOTTLES CRITICAL VALUE NOTED.  VALUE IS CONSISTENT WITH PREVIOUSLY REPORTED AND CALLED VALUE.    Culture (A)  Final    KLEBSIELLA PNEUMONIAE SUSCEPTIBILITIES PERFORMED ON PREVIOUS CULTURE WITHIN THE LAST 5 DAYS. Performed at [facility], [address], [place], [place] [address]    Report Status 02/08/2024 FINAL  Final  Blood Culture (routine x 2)     Status: Abnormal   Collection Time: 02/05/24  1:26 PM   Specimen: BLOOD  Result Value Ref Range Status   Specimen Description  Final    BLOOD BLOOD RIGHT HAND Performed at [facility], [address] [place], [place] [address]    Special Requests   Final    BOTTLES DRAWN AEROBIC AND ANAEROBIC Blood Culture adequate volume Performed at [facility], [address], [place], [place] [address]    Culture  Setup Time   Final    GRAM NEGATIVE RODS IN BOTH AEROBIC AND ANAEROBIC BOTTLES CRITICAL RESULT CALLED TO, READ BACK BY AND VERIFIED WITH: PHARMD J.LEGGE AT 0827 ON 02/06/2024 BY T.SAAD. Performed at [facility], [address], [place], [place] [address]    Culture KLEBSIELLA PNEUMONIAE (A)  Final   Report Status 02/08/2024 FINAL  Final   Organism ID, Bacteria KLEBSIELLA PNEUMONIAE  Final      Susceptibility   Klebsiella pneumoniae - MIC*    AMPICILLIN  >=32 RESISTANT Resistant     CEFAZOLIN (NON-URINE) 2 SENSITIVE Sensitive     CEFEPIME  <=0.12 SENSITIVE Sensitive     ERTAPENEM <=0.12 SENSITIVE Sensitive     CEFTRIAXONE  <=0.25 SENSITIVE Sensitive     CIPROFLOXACIN <=0.06 SENSITIVE Sensitive     GENTAMICIN <=1 SENSITIVE Sensitive     MEROPENEM <=0.25 SENSITIVE Sensitive     TRIMETH/SULFA <=20 SENSITIVE Sensitive     AMPICILLIN /SULBACTAM 4 SENSITIVE Sensitive     PIP/TAZO Value in next row Sensitive ug/mL     <=4 SENSITIVEThis is a modified FDA-approved test that has been validated and  its performance characteristics determined by the reporting laboratory.  This laboratory is certified under the Clinical Laboratory Improvement Amendments CLIA as qualified to perform high complexity clinical laboratory testing.    * KLEBSIELLA PNEUMONIAE  Blood Culture ID Panel (Reflexed)     Status: Abnormal   Collection Time: 02/05/24  1:26 PM  Result Value Ref Range Status   Enterococcus faecalis NOT DETECTED NOT DETECTED Final   Enterococcus Faecium NOT DETECTED NOT DETECTED Final   Listeria monocytogenes NOT DETECTED NOT DETECTED Final   Staphylococcus species NOT DETECTED NOT DETECTED Final   Staphylococcus aureus (BCID) NOT DETECTED NOT DETECTED Final   Staphylococcus epidermidis NOT DETECTED NOT DETECTED Final   Staphylococcus lugdunensis NOT DETECTED NOT DETECTED Final   Streptococcus species NOT DETECTED NOT DETECTED Final   Streptococcus agalactiae NOT DETECTED NOT DETECTED Final   Streptococcus pneumoniae NOT DETECTED NOT DETECTED Final   Streptococcus pyogenes NOT DETECTED NOT DETECTED Final   A.calcoaceticus-baumannii NOT DETECTED NOT DETECTED Final   Bacteroides fragilis NOT DETECTED NOT DETECTED Final   Enterobacterales DETECTED (A) NOT DETECTED Final    Comment: Enterobacterales represent a large order of gram negative bacteria, not a single organism. CRITICAL RESULT CALLED TO, READ BACK BY AND VERIFIED WITH: PHARMD J.LEGGE AT 0827 ON 02/06/2024 BY T.SAAD.    Enterobacter cloacae complex NOT DETECTED NOT DETECTED Final   Escherichia coli NOT DETECTED NOT DETECTED Final   Klebsiella aerogenes NOT DETECTED NOT DETECTED Final   Klebsiella oxytoca NOT DETECTED NOT DETECTED Final   Klebsiella pneumoniae DETECTED (A) NOT DETECTED Final    Comment: CRITICAL RESULT CALLED TO, READ BACK BY AND VERIFIED WITH: PHARMD J.LEGGE AT 0827 ON 02/06/2024 BY T.SAAD.    Proteus species NOT DETECTED NOT DETECTED Final   Salmonella species NOT DETECTED NOT DETECTED Final   Serratia  marcescens NOT DETECTED NOT DETECTED Final   Haemophilus influenzae NOT DETECTED NOT DETECTED Final   Neisseria meningitidis NOT DETECTED NOT DETECTED Final   Pseudomonas aeruginosa NOT DETECTED NOT DETECTED Final  Stenotrophomonas maltophilia NOT DETECTED NOT DETECTED Final   Candida albicans NOT DETECTED NOT DETECTED Final   Candida auris NOT DETECTED NOT DETECTED Final   Candida glabrata NOT DETECTED NOT DETECTED Final   Candida krusei NOT DETECTED NOT DETECTED Final   Candida parapsilosis NOT DETECTED NOT DETECTED Final   Candida tropicalis NOT DETECTED NOT DETECTED Final   Cryptococcus neoformans/gattii NOT DETECTED NOT DETECTED Final   CTX-M ESBL NOT DETECTED NOT DETECTED Final   Carbapenem resistance IMP NOT DETECTED NOT DETECTED Final   Carbapenem resistance KPC NOT DETECTED NOT DETECTED Final   Carbapenem resistance NDM NOT DETECTED NOT DETECTED Final   Carbapenem resist OXA 48 LIKE NOT DETECTED NOT DETECTED Final   Carbapenem resistance VIM NOT DETECTED NOT DETECTED Final    Comment: Performed at [facility], [address], [place], [place] [address]     Labs: BNP (last 3 results) Recent Labs    12/15/23 0821 12/16/23 0615 12/17/23 0601  BNP 1,013.6* 868.0* 742.1*   Basic Metabolic Panel: Recent Labs  Lab 02/05/24 1326 02/05/24 1923 02/06/24 0511 02/07/24 0420 02/08/24 0539 02/09/24 0459  NA 129*  --  131* 131* 135 137  K 5.0  --  4.7 4.2 4.3 4.4  CL 98  --  101 105 108 108  CO2 15*  --  14* 14* 17* 17*  GLUCOSE 132*  --  122* 109* 100* 105*  BUN 52*  --  55* 40* 26* 23  CREATININE 3.16* 3.00* 3.13* 2.22* 1.63* 1.49*  CALCIUM  9.1  --  8.8* 8.2* 8.5* 8.5*   Liver Function Tests: Recent Labs  Lab 02/05/24 1326 02/06/24 0511  AST 23 21  ALT 10 10  ALKPHOS 84 153*  BILITOT 0.2 0.3  PROT 7.4 6.8  ALBUMIN  3.6 3.7   No results for input(s): LIPASE, AMYLASE in the last 168 hours. No results for input(s): AMMONIA in the last 168  hours. CBC: Recent Labs  Lab 02/05/24 1326 02/05/24 1923 02/06/24 0511 02/07/24 0420 02/08/24 0539 02/09/24 0459  WBC 8.8 8.5 7.5 5.4 5.8 6.8  NEUTROABS 7.2  --   --   --   --   --   HGB 8.9* 8.6* 8.2* 7.5* 8.0* 8.3*  HCT 30.5* 27.8* 27.7* 26.0* 26.9* 28.7*  MCV 90.2 87.1 89.6 90.6 91.5 93.2  PLT 136* 128* 141* 149* 172 187   Cardiac Enzymes: No results for input(s): CKTOTAL, CKMB, CKMBINDEX, TROPONINI in the last 168 hours. BNP: Invalid input(s): POCBNP CBG: No results for input(s): GLUCAP in the last 168 hours. D-Dimer No results for input(s): DDIMER in the last 72 hours. Hgb A1c No results for input(s): HGBA1C in the last 72 hours. Lipid Profile No results for input(s): CHOL, HDL, LDLCALC, TRIG, CHOLHDL, LDLDIRECT in the last 72 hours. Thyroid  function studies No results for input(s): TSH, T4TOTAL, T3FREE, THYROIDAB in the last 72 hours.  Invalid input(s): FREET3 Anemia work up Recent Labs    02/07/24 1134  TIBC 135*  IRON  17*   Urinalysis    Component Value Date/Time   COLORURINE YELLOW 02/05/2024 1253   APPEARANCEUR TURBID (A) 02/05/2024 1253   LABSPEC 1.025 02/05/2024 1253   PHURINE 6.0 02/05/2024 1253   GLUCOSEU NEGATIVE 02/05/2024 1253   HGBUR LARGE (A) 02/05/2024 1253   BILIRUBINUR MODERATE (A) 02/05/2024 1253   KETONESUR NEGATIVE 02/05/2024 1253   PROTEINUR >300 (A) 02/05/2024 1253   UROBILINOGEN 0.2 09/12/2013 1446   NITRITE POSITIVE (A) 02/05/2024 1253   LEUKOCYTESUR LARGE (  A) 02/05/2024 1253   Sepsis Labs Recent Labs  Lab 02/06/24 0511 02/07/24 0420 02/08/24 0539 02/09/24 0459  WBC 7.5 5.4 5.8 6.8   Microbiology Recent Results (from the past 240 hours)  Urine Culture (for pregnant, neutropenic or urologic patients or patients with an indwelling urinary catheter)     Status: Abnormal   Collection Time: 02/05/24  8:06 AM   Specimen: Urine, Clean Catch  Result Value Ref Range Status   Specimen  Description   Final    URINE, CLEAN CATCH Performed at [facility], [address], [place], [place] [address]    Special Requests   Final    NONE Performed at [facility], [address] [place], [place] [address]    Culture   Final    Two isolates with different morphologies were identified as the same organism.The most resistant organism was reported. >=100,000 COLONIES/mL KLEBSIELLA PNEUMONIAE    Report Status 02/08/2024 FINAL  Final   Organism ID, Bacteria KLEBSIELLA PNEUMONIAE (A)  Final      Susceptibility   Klebsiella pneumoniae - MIC*    AMPICILLIN  RESISTANT Resistant     CEFAZOLIN (URINE) Value in next row Resistant      >=32 RESISTANTThis is a modified FDA-approved test that has been validated and its performance characteristics determined by the reporting laboratory.  This laboratory is certified under the Clinical Laboratory Improvement Amendments CLIA as qualified to perform high complexity clinical laboratory testing.    CEFEPIME  Value in next row Sensitive      >=32 RESISTANTThis is a modified FDA-approved test that has been validated and its performance characteristics determined by the reporting laboratory.  This laboratory is certified under the Clinical Laboratory Improvement Amendments CLIA as qualified to perform high complexity clinical laboratory testing.    ERTAPENEM Value in next row Sensitive      >=32 RESISTANTThis is a modified FDA-approved test that has been validated and its performance characteristics determined by the reporting laboratory.  This laboratory is certified under the Clinical Laboratory Improvement Amendments CLIA as qualified to perform high complexity clinical laboratory testing.    CEFTRIAXONE  Value in next row Sensitive      >=32 RESISTANTThis is a modified FDA-approved test that has been validated and its performance characteristics determined by the reporting laboratory.  This laboratory is certified  under the Clinical Laboratory Improvement Amendments CLIA as qualified to perform high complexity clinical laboratory testing.    CIPROFLOXACIN Value in next row Sensitive      >=32 RESISTANTThis is a modified FDA-approved test that has been validated and its performance characteristics determined by the reporting laboratory.  This laboratory is certified under the Clinical Laboratory Improvement Amendments CLIA as qualified to perform high complexity clinical laboratory testing.    GENTAMICIN Value in next row Sensitive      >=32 RESISTANTThis is a modified FDA-approved test that has been validated and its performance characteristics determined by the reporting laboratory.  This laboratory is certified under the Clinical Laboratory Improvement Amendments CLIA as qualified to perform high complexity clinical laboratory testing.    NITROFURANTOIN Value in next row Intermediate      >=32 RESISTANTThis is a modified FDA-approved test that has been validated and its performance characteristics determined by the reporting laboratory.  This laboratory is certified under the Clinical Laboratory Improvement Amendments CLIA as qualified to perform high complexity clinical laboratory testing.    TRIMETH/SULFA Value in next row Sensitive      >=32 RESISTANTThis  is a modified FDA-approved test that has been validated and its performance characteristics determined by the reporting laboratory.  This laboratory is certified under the Clinical Laboratory Improvement Amendments CLIA as qualified to perform high complexity clinical laboratory testing.    AMPICILLIN /SULBACTAM Value in next row Sensitive      >=32 RESISTANTThis is a modified FDA-approved test that has been validated and its performance characteristics determined by the reporting laboratory.  This laboratory is certified under the Clinical Laboratory Improvement Amendments CLIA as qualified to perform high complexity clinical laboratory testing.    PIP/TAZO  Value in next row Sensitive ug/mL     <=4 SENSITIVEThis is a modified FDA-approved test that has been validated and its performance characteristics determined by the reporting laboratory.  This laboratory is certified under the Clinical Laboratory Improvement Amendments CLIA as qualified to perform high complexity clinical laboratory testing.    MEROPENEM Value in next row Sensitive      <=4 SENSITIVEThis is a modified FDA-approved test that has been validated and its performance characteristics determined by the reporting laboratory.  This laboratory is certified under the Clinical Laboratory Improvement Amendments CLIA as qualified to perform high complexity clinical laboratory testing.    * >=100,000 COLONIES/mL KLEBSIELLA PNEUMONIAE  Blood Culture (routine x 2)     Status: Abnormal   Collection Time: 02/05/24  1:26 PM   Specimen: BLOOD  Result Value Ref Range Status   Specimen Description   Final    BLOOD RIGHT ANTECUBITAL Performed at [facility], [address], [place], [place] [address]    Special Requests   Final    BOTTLES DRAWN AEROBIC AND ANAEROBIC Blood Culture adequate volume Performed at [facility], [address] [place], [place] [address]    Culture  Setup Time   Final    GRAM NEGATIVE RODS IN BOTH AEROBIC AND ANAEROBIC BOTTLES CRITICAL VALUE NOTED.  VALUE IS CONSISTENT WITH PREVIOUSLY REPORTED AND CALLED VALUE.    Culture (A)  Final    KLEBSIELLA PNEUMONIAE SUSCEPTIBILITIES PERFORMED ON PREVIOUS CULTURE WITHIN THE LAST 5 DAYS. Performed at [facility], [address] [place], [place] [address]    Report Status 02/08/2024 FINAL  Final  Blood Culture (routine x 2)     Status: Abnormal   Collection Time: 02/05/24  1:26 PM   Specimen: BLOOD  Result Value Ref Range Status   Specimen Description   Final    BLOOD BLOOD RIGHT HAND Performed at [facility], [address], [place], [place] [address]     Special Requests   Final    BOTTLES DRAWN AEROBIC AND ANAEROBIC Blood Culture adequate volume Performed at [facility], [address], [place], [place] [address]    Culture  Setup Time   Final    GRAM NEGATIVE RODS IN BOTH AEROBIC AND ANAEROBIC BOTTLES CRITICAL RESULT CALLED TO, READ BACK BY AND VERIFIED WITH: PHARMD J.LEGGE AT 0827 ON 02/06/2024 BY T.SAAD. Performed at [facility], [address] [place], [place] [address]    Culture KLEBSIELLA PNEUMONIAE (A)  Final   Report Status 02/08/2024 FINAL  Final   Organism ID, Bacteria KLEBSIELLA PNEUMONIAE  Final      Susceptibility   Klebsiella pneumoniae - MIC*    AMPICILLIN  >=32 RESISTANT Resistant     CEFAZOLIN (NON-URINE) 2 SENSITIVE Sensitive     CEFEPIME  <=0.12 SENSITIVE Sensitive     ERTAPENEM <=0.12 SENSITIVE Sensitive     CEFTRIAXONE  <=  0.25 SENSITIVE Sensitive     CIPROFLOXACIN <=0.06 SENSITIVE Sensitive     GENTAMICIN <=1 SENSITIVE Sensitive     MEROPENEM <=0.25 SENSITIVE Sensitive     TRIMETH/SULFA <=20 SENSITIVE Sensitive     AMPICILLIN /SULBACTAM 4 SENSITIVE Sensitive     PIP/TAZO Value in next row Sensitive ug/mL     <=4 SENSITIVEThis is a modified FDA-approved test that has been validated and its performance characteristics determined by the reporting laboratory.  This laboratory is certified under the Clinical Laboratory Improvement Amendments CLIA as qualified to perform high complexity clinical laboratory testing.    * KLEBSIELLA PNEUMONIAE  Blood Culture ID Panel (Reflexed)     Status: Abnormal   Collection Time: 02/05/24  1:26 PM  Result Value Ref Range Status   Enterococcus faecalis NOT DETECTED NOT DETECTED Final   Enterococcus Faecium NOT DETECTED NOT DETECTED Final   Listeria monocytogenes NOT DETECTED NOT DETECTED Final   Staphylococcus species NOT DETECTED NOT DETECTED Final   Staphylococcus aureus (BCID) NOT DETECTED NOT DETECTED Final   Staphylococcus epidermidis NOT DETECTED NOT  DETECTED Final   Staphylococcus lugdunensis NOT DETECTED NOT DETECTED Final   Streptococcus species NOT DETECTED NOT DETECTED Final   Streptococcus agalactiae NOT DETECTED NOT DETECTED Final   Streptococcus pneumoniae NOT DETECTED NOT DETECTED Final   Streptococcus pyogenes NOT DETECTED NOT DETECTED Final   A.calcoaceticus-baumannii NOT DETECTED NOT DETECTED Final   Bacteroides fragilis NOT DETECTED NOT DETECTED Final   Enterobacterales DETECTED (A) NOT DETECTED Final    Comment: Enterobacterales represent a large order of gram negative bacteria, not a single organism. CRITICAL RESULT CALLED TO, READ BACK BY AND VERIFIED WITH: PHARMD J.LEGGE AT 0827 ON 02/06/2024 BY T.SAAD.    Enterobacter cloacae complex NOT DETECTED NOT DETECTED Final   Escherichia coli NOT DETECTED NOT DETECTED Final   Klebsiella aerogenes NOT DETECTED NOT DETECTED Final   Klebsiella oxytoca NOT DETECTED NOT DETECTED Final   Klebsiella pneumoniae DETECTED (A) NOT DETECTED Final    Comment: CRITICAL RESULT CALLED TO, READ BACK BY AND VERIFIED WITH: PHARMD J.LEGGE AT 0827 ON 02/06/2024 BY T.SAAD.    Proteus species NOT DETECTED NOT DETECTED Final   Salmonella species NOT DETECTED NOT DETECTED Final   Serratia marcescens NOT DETECTED NOT DETECTED Final   Haemophilus influenzae NOT DETECTED NOT DETECTED Final   Neisseria meningitidis NOT DETECTED NOT DETECTED Final   Pseudomonas aeruginosa NOT DETECTED NOT DETECTED Final   Stenotrophomonas maltophilia NOT DETECTED NOT DETECTED Final   Candida albicans NOT DETECTED NOT DETECTED Final   Candida auris NOT DETECTED NOT DETECTED Final   Candida glabrata NOT DETECTED NOT DETECTED Final   Candida krusei NOT DETECTED NOT DETECTED Final   Candida parapsilosis NOT DETECTED NOT DETECTED Final   Candida tropicalis NOT DETECTED NOT DETECTED Final   Cryptococcus neoformans/gattii NOT DETECTED NOT DETECTED Final   CTX-M ESBL NOT DETECTED NOT DETECTED Final   Carbapenem resistance  IMP NOT DETECTED NOT DETECTED Final   Carbapenem resistance KPC NOT DETECTED NOT DETECTED Final   Carbapenem resistance NDM NOT DETECTED NOT DETECTED Final   Carbapenem resist OXA 48 LIKE NOT DETECTED NOT DETECTED Final   Carbapenem resistance VIM NOT DETECTED NOT DETECTED Final    Comment: Performed at [facility], [address], [place], [place] [address]    Please note: You were cared for by a hospitalist during your hospital stay. Once you are discharged, your primary care physician will handle any further medical issues. Please note that  NO REFILLS for any discharge medications will be authorized once you are discharged, as it is imperative that you return to your primary care physician (or establish a relationship with a primary care physician if you do not have one) for your post hospital discharge needs so that they can reassess your need for medications and monitor your lab values.    Time coordinating discharge: 40 minutes  SIGNED:   Ivonne Mustache, MD  [facility] 02/09/2024, 10:17 AM Pager [phone]  If 7PM-7AM, please contact night-coverage [email or web address] Password [email or web address]

## 2024-02-10 DIAGNOSIS — N390 Urinary tract infection, site not specified: Secondary | ICD-10-CM | POA: Diagnosis not present

## 2024-02-10 DIAGNOSIS — E871 Hypo-osmolality and hyponatremia: Secondary | ICD-10-CM | POA: Diagnosis not present

## 2024-02-10 DIAGNOSIS — N3000 Acute cystitis without hematuria: Secondary | ICD-10-CM | POA: Diagnosis not present

## 2024-02-10 DIAGNOSIS — E8729 Other acidosis: Secondary | ICD-10-CM | POA: Diagnosis not present

## 2024-02-10 DIAGNOSIS — D638 Anemia in other chronic diseases classified elsewhere: Secondary | ICD-10-CM | POA: Diagnosis not present

## 2024-02-10 DIAGNOSIS — K219 Gastro-esophageal reflux disease without esophagitis: Secondary | ICD-10-CM | POA: Diagnosis not present

## 2024-02-10 DIAGNOSIS — N400 Enlarged prostate without lower urinary tract symptoms: Secondary | ICD-10-CM | POA: Diagnosis not present

## 2024-02-10 DIAGNOSIS — R6520 Severe sepsis without septic shock: Secondary | ICD-10-CM | POA: Diagnosis not present

## 2024-02-10 DIAGNOSIS — T839XXD Unspecified complication of genitourinary prosthetic device, implant and graft, subsequent encounter: Secondary | ICD-10-CM | POA: Diagnosis not present

## 2024-02-10 LAB — ZINC: Zinc: 49 ug/dL (ref 44–115)

## 2024-02-10 LAB — COPPER, SERUM: Copper: 112 ug/dL (ref 69–132)

## 2024-02-11 ENCOUNTER — Telehealth: Payer: Self-pay

## 2024-02-11 DIAGNOSIS — T839XXD Unspecified complication of genitourinary prosthetic device, implant and graft, subsequent encounter: Secondary | ICD-10-CM | POA: Diagnosis not present

## 2024-02-11 DIAGNOSIS — E871 Hypo-osmolality and hyponatremia: Secondary | ICD-10-CM | POA: Diagnosis not present

## 2024-02-11 DIAGNOSIS — N400 Enlarged prostate without lower urinary tract symptoms: Secondary | ICD-10-CM | POA: Diagnosis not present

## 2024-02-11 DIAGNOSIS — A419 Sepsis, unspecified organism: Secondary | ICD-10-CM | POA: Diagnosis not present

## 2024-02-11 DIAGNOSIS — E8729 Other acidosis: Secondary | ICD-10-CM | POA: Diagnosis not present

## 2024-02-11 DIAGNOSIS — D638 Anemia in other chronic diseases classified elsewhere: Secondary | ICD-10-CM | POA: Diagnosis not present

## 2024-02-11 DIAGNOSIS — N390 Urinary tract infection, site not specified: Secondary | ICD-10-CM | POA: Diagnosis not present

## 2024-02-11 DIAGNOSIS — E785 Hyperlipidemia, unspecified: Secondary | ICD-10-CM | POA: Diagnosis not present

## 2024-02-11 DIAGNOSIS — R6520 Severe sepsis without septic shock: Secondary | ICD-10-CM | POA: Diagnosis not present

## 2024-02-11 DIAGNOSIS — N3000 Acute cystitis without hematuria: Secondary | ICD-10-CM | POA: Diagnosis not present

## 2024-02-11 DIAGNOSIS — K219 Gastro-esophageal reflux disease without esophagitis: Secondary | ICD-10-CM | POA: Diagnosis not present

## 2024-02-11 LAB — MISC LABCORP TEST (SEND OUT): Labcorp test code: 716910

## 2024-02-11 NOTE — Telephone Encounter (Signed)
 Copied from CRM [id number]. Topic: Clinical - Medical Advice >> Feb 11, 2024 10:10 AM Revonda D wrote: Reason for CRM: Pt's wife [name] stated that the pt currently has a foley but stated that he keeps taking it out. Wife stated that he saw a nurse today but she advised to reach out to Dr.Morrison to see what the best option would be for the pt and how to keep him from taking out the foley. Wife would like a callback today if possible regarding this concern.  Spoke with pt wife maryann stated pt is in snf now due to insurance not letting him stay in hospital. She thinks he is pulling on the colotomy bag at times now. She states he is not confused she is suppose to be having meeting with drs and nurses today at the snf nursing home. She will call back with update tomorrow

## 2024-02-12 ENCOUNTER — Encounter: Payer: Self-pay | Admitting: Gastroenterology

## 2024-02-12 DIAGNOSIS — N3000 Acute cystitis without hematuria: Secondary | ICD-10-CM | POA: Diagnosis not present

## 2024-02-12 DIAGNOSIS — D638 Anemia in other chronic diseases classified elsewhere: Secondary | ICD-10-CM | POA: Diagnosis not present

## 2024-02-12 DIAGNOSIS — R262 Difficulty in walking, not elsewhere classified: Secondary | ICD-10-CM | POA: Diagnosis not present

## 2024-02-12 DIAGNOSIS — N400 Enlarged prostate without lower urinary tract symptoms: Secondary | ICD-10-CM | POA: Diagnosis not present

## 2024-02-12 DIAGNOSIS — K219 Gastro-esophageal reflux disease without esophagitis: Secondary | ICD-10-CM | POA: Diagnosis not present

## 2024-02-12 DIAGNOSIS — N1831 Chronic kidney disease, stage 3a: Secondary | ICD-10-CM | POA: Diagnosis not present

## 2024-02-12 DIAGNOSIS — N321 Vesicointestinal fistula: Secondary | ICD-10-CM | POA: Diagnosis not present

## 2024-02-12 DIAGNOSIS — E785 Hyperlipidemia, unspecified: Secondary | ICD-10-CM | POA: Diagnosis not present

## 2024-02-12 DIAGNOSIS — N139 Obstructive and reflux uropathy, unspecified: Secondary | ICD-10-CM | POA: Diagnosis not present

## 2024-02-12 NOTE — Telephone Encounter (Signed)
 Wife is supposed to call back today about what is going on at the snf nursing.

## 2024-02-13 DIAGNOSIS — N3000 Acute cystitis without hematuria: Secondary | ICD-10-CM | POA: Diagnosis not present

## 2024-02-13 DIAGNOSIS — N400 Enlarged prostate without lower urinary tract symptoms: Secondary | ICD-10-CM | POA: Diagnosis not present

## 2024-02-13 DIAGNOSIS — E785 Hyperlipidemia, unspecified: Secondary | ICD-10-CM | POA: Diagnosis not present

## 2024-02-13 DIAGNOSIS — K219 Gastro-esophageal reflux disease without esophagitis: Secondary | ICD-10-CM | POA: Diagnosis not present

## 2024-02-13 DIAGNOSIS — D638 Anemia in other chronic diseases classified elsewhere: Secondary | ICD-10-CM | POA: Diagnosis not present

## 2024-02-13 DIAGNOSIS — N139 Obstructive and reflux uropathy, unspecified: Secondary | ICD-10-CM | POA: Diagnosis not present

## 2024-02-13 DIAGNOSIS — R262 Difficulty in walking, not elsewhere classified: Secondary | ICD-10-CM | POA: Diagnosis not present

## 2024-02-13 DIAGNOSIS — N321 Vesicointestinal fistula: Secondary | ICD-10-CM | POA: Diagnosis not present

## 2024-02-13 DIAGNOSIS — N1831 Chronic kidney disease, stage 3a: Secondary | ICD-10-CM | POA: Diagnosis not present

## 2024-02-14 DIAGNOSIS — N139 Obstructive and reflux uropathy, unspecified: Secondary | ICD-10-CM | POA: Diagnosis not present

## 2024-02-14 DIAGNOSIS — E785 Hyperlipidemia, unspecified: Secondary | ICD-10-CM | POA: Diagnosis not present

## 2024-02-14 DIAGNOSIS — D638 Anemia in other chronic diseases classified elsewhere: Secondary | ICD-10-CM | POA: Diagnosis not present

## 2024-02-14 DIAGNOSIS — N321 Vesicointestinal fistula: Secondary | ICD-10-CM | POA: Diagnosis not present

## 2024-02-14 DIAGNOSIS — N1831 Chronic kidney disease, stage 3a: Secondary | ICD-10-CM | POA: Diagnosis not present

## 2024-02-14 DIAGNOSIS — K219 Gastro-esophageal reflux disease without esophagitis: Secondary | ICD-10-CM | POA: Diagnosis not present

## 2024-02-14 DIAGNOSIS — R262 Difficulty in walking, not elsewhere classified: Secondary | ICD-10-CM | POA: Diagnosis not present

## 2024-02-14 DIAGNOSIS — N400 Enlarged prostate without lower urinary tract symptoms: Secondary | ICD-10-CM | POA: Diagnosis not present

## 2024-02-14 DIAGNOSIS — N3000 Acute cystitis without hematuria: Secondary | ICD-10-CM | POA: Diagnosis not present

## 2024-02-15 DIAGNOSIS — K219 Gastro-esophageal reflux disease without esophagitis: Secondary | ICD-10-CM | POA: Diagnosis not present

## 2024-02-15 DIAGNOSIS — R41841 Cognitive communication deficit: Secondary | ICD-10-CM | POA: Diagnosis not present

## 2024-02-15 DIAGNOSIS — N3000 Acute cystitis without hematuria: Secondary | ICD-10-CM | POA: Diagnosis not present

## 2024-02-15 DIAGNOSIS — N1831 Chronic kidney disease, stage 3a: Secondary | ICD-10-CM | POA: Diagnosis not present

## 2024-02-15 DIAGNOSIS — N321 Vesicointestinal fistula: Secondary | ICD-10-CM | POA: Diagnosis not present

## 2024-02-15 DIAGNOSIS — N400 Enlarged prostate without lower urinary tract symptoms: Secondary | ICD-10-CM | POA: Diagnosis not present

## 2024-02-15 DIAGNOSIS — R262 Difficulty in walking, not elsewhere classified: Secondary | ICD-10-CM | POA: Diagnosis not present

## 2024-02-15 DIAGNOSIS — M6281 Muscle weakness (generalized): Secondary | ICD-10-CM | POA: Diagnosis not present

## 2024-02-15 DIAGNOSIS — D638 Anemia in other chronic diseases classified elsewhere: Secondary | ICD-10-CM | POA: Diagnosis not present

## 2024-02-16 ENCOUNTER — Encounter: Payer: Self-pay | Admitting: Internal Medicine

## 2024-02-17 DIAGNOSIS — N400 Enlarged prostate without lower urinary tract symptoms: Secondary | ICD-10-CM | POA: Diagnosis not present

## 2024-02-17 DIAGNOSIS — N321 Vesicointestinal fistula: Secondary | ICD-10-CM | POA: Diagnosis not present

## 2024-02-17 DIAGNOSIS — N139 Obstructive and reflux uropathy, unspecified: Secondary | ICD-10-CM | POA: Diagnosis not present

## 2024-02-17 DIAGNOSIS — K219 Gastro-esophageal reflux disease without esophagitis: Secondary | ICD-10-CM | POA: Diagnosis not present

## 2024-02-17 DIAGNOSIS — R262 Difficulty in walking, not elsewhere classified: Secondary | ICD-10-CM | POA: Diagnosis not present

## 2024-02-17 DIAGNOSIS — E785 Hyperlipidemia, unspecified: Secondary | ICD-10-CM | POA: Diagnosis not present

## 2024-02-17 DIAGNOSIS — N1831 Chronic kidney disease, stage 3a: Secondary | ICD-10-CM | POA: Diagnosis not present

## 2024-02-17 DIAGNOSIS — N3000 Acute cystitis without hematuria: Secondary | ICD-10-CM | POA: Diagnosis not present

## 2024-02-17 DIAGNOSIS — D638 Anemia in other chronic diseases classified elsewhere: Secondary | ICD-10-CM | POA: Diagnosis not present

## 2024-02-19 DIAGNOSIS — N321 Vesicointestinal fistula: Secondary | ICD-10-CM | POA: Diagnosis not present

## 2024-02-19 DIAGNOSIS — E785 Hyperlipidemia, unspecified: Secondary | ICD-10-CM | POA: Diagnosis not present

## 2024-02-19 DIAGNOSIS — R262 Difficulty in walking, not elsewhere classified: Secondary | ICD-10-CM | POA: Diagnosis not present

## 2024-02-19 DIAGNOSIS — N3000 Acute cystitis without hematuria: Secondary | ICD-10-CM | POA: Diagnosis not present

## 2024-02-19 DIAGNOSIS — D638 Anemia in other chronic diseases classified elsewhere: Secondary | ICD-10-CM | POA: Diagnosis not present

## 2024-02-19 DIAGNOSIS — N400 Enlarged prostate without lower urinary tract symptoms: Secondary | ICD-10-CM | POA: Diagnosis not present

## 2024-02-19 DIAGNOSIS — N139 Obstructive and reflux uropathy, unspecified: Secondary | ICD-10-CM | POA: Diagnosis not present

## 2024-02-19 DIAGNOSIS — N1831 Chronic kidney disease, stage 3a: Secondary | ICD-10-CM | POA: Diagnosis not present

## 2024-02-19 DIAGNOSIS — K219 Gastro-esophageal reflux disease without esophagitis: Secondary | ICD-10-CM | POA: Diagnosis not present

## 2024-02-19 NOTE — Telephone Encounter (Signed)
 Spoke with pt wife Calvin Escobar about pt ostomy states that the nursing home was doing the care for it and Calvin Escobar is doing better with it. Pt is in nursing home til beginning of October. She is going to call back up to the practice to make appt with Dr Urbano for sometime in Oct. She states she will learn how to take care of the ostomy bag before he comes.

## 2024-02-21 DIAGNOSIS — N321 Vesicointestinal fistula: Secondary | ICD-10-CM | POA: Diagnosis not present

## 2024-02-21 DIAGNOSIS — E785 Hyperlipidemia, unspecified: Secondary | ICD-10-CM | POA: Diagnosis not present

## 2024-02-21 DIAGNOSIS — N1831 Chronic kidney disease, stage 3a: Secondary | ICD-10-CM | POA: Diagnosis not present

## 2024-02-21 DIAGNOSIS — N3000 Acute cystitis without hematuria: Secondary | ICD-10-CM | POA: Diagnosis not present

## 2024-02-21 DIAGNOSIS — D638 Anemia in other chronic diseases classified elsewhere: Secondary | ICD-10-CM | POA: Diagnosis not present

## 2024-02-21 DIAGNOSIS — N139 Obstructive and reflux uropathy, unspecified: Secondary | ICD-10-CM | POA: Diagnosis not present

## 2024-02-21 DIAGNOSIS — N400 Enlarged prostate without lower urinary tract symptoms: Secondary | ICD-10-CM | POA: Diagnosis not present

## 2024-02-21 DIAGNOSIS — R262 Difficulty in walking, not elsewhere classified: Secondary | ICD-10-CM | POA: Diagnosis not present

## 2024-02-21 DIAGNOSIS — K219 Gastro-esophageal reflux disease without esophagitis: Secondary | ICD-10-CM | POA: Diagnosis not present

## 2024-02-25 DIAGNOSIS — N321 Vesicointestinal fistula: Secondary | ICD-10-CM | POA: Diagnosis not present

## 2024-02-25 DIAGNOSIS — N1831 Chronic kidney disease, stage 3a: Secondary | ICD-10-CM | POA: Diagnosis not present

## 2024-02-25 DIAGNOSIS — N400 Enlarged prostate without lower urinary tract symptoms: Secondary | ICD-10-CM | POA: Diagnosis not present

## 2024-02-25 DIAGNOSIS — K219 Gastro-esophageal reflux disease without esophagitis: Secondary | ICD-10-CM | POA: Diagnosis not present

## 2024-02-25 DIAGNOSIS — D638 Anemia in other chronic diseases classified elsewhere: Secondary | ICD-10-CM | POA: Diagnosis not present

## 2024-02-25 DIAGNOSIS — R262 Difficulty in walking, not elsewhere classified: Secondary | ICD-10-CM | POA: Diagnosis not present

## 2024-02-25 DIAGNOSIS — E785 Hyperlipidemia, unspecified: Secondary | ICD-10-CM | POA: Diagnosis not present

## 2024-02-25 DIAGNOSIS — N3000 Acute cystitis without hematuria: Secondary | ICD-10-CM | POA: Diagnosis not present

## 2024-02-25 DIAGNOSIS — N139 Obstructive and reflux uropathy, unspecified: Secondary | ICD-10-CM | POA: Diagnosis not present

## 2024-02-26 DIAGNOSIS — N139 Obstructive and reflux uropathy, unspecified: Secondary | ICD-10-CM | POA: Diagnosis not present

## 2024-02-26 DIAGNOSIS — D638 Anemia in other chronic diseases classified elsewhere: Secondary | ICD-10-CM | POA: Diagnosis not present

## 2024-02-26 DIAGNOSIS — K219 Gastro-esophageal reflux disease without esophagitis: Secondary | ICD-10-CM | POA: Diagnosis not present

## 2024-02-26 DIAGNOSIS — R262 Difficulty in walking, not elsewhere classified: Secondary | ICD-10-CM | POA: Diagnosis not present

## 2024-02-26 DIAGNOSIS — N1831 Chronic kidney disease, stage 3a: Secondary | ICD-10-CM | POA: Diagnosis not present

## 2024-02-26 DIAGNOSIS — N3000 Acute cystitis without hematuria: Secondary | ICD-10-CM | POA: Diagnosis not present

## 2024-02-26 DIAGNOSIS — N400 Enlarged prostate without lower urinary tract symptoms: Secondary | ICD-10-CM | POA: Diagnosis not present

## 2024-02-26 DIAGNOSIS — E785 Hyperlipidemia, unspecified: Secondary | ICD-10-CM | POA: Diagnosis not present

## 2024-02-26 DIAGNOSIS — N321 Vesicointestinal fistula: Secondary | ICD-10-CM | POA: Diagnosis not present

## 2024-02-28 DIAGNOSIS — R262 Difficulty in walking, not elsewhere classified: Secondary | ICD-10-CM | POA: Diagnosis not present

## 2024-02-28 DIAGNOSIS — E785 Hyperlipidemia, unspecified: Secondary | ICD-10-CM | POA: Diagnosis not present

## 2024-02-28 DIAGNOSIS — N400 Enlarged prostate without lower urinary tract symptoms: Secondary | ICD-10-CM | POA: Diagnosis not present

## 2024-02-28 DIAGNOSIS — K219 Gastro-esophageal reflux disease without esophagitis: Secondary | ICD-10-CM | POA: Diagnosis not present

## 2024-02-28 DIAGNOSIS — N139 Obstructive and reflux uropathy, unspecified: Secondary | ICD-10-CM | POA: Diagnosis not present

## 2024-02-28 DIAGNOSIS — N321 Vesicointestinal fistula: Secondary | ICD-10-CM | POA: Diagnosis not present

## 2024-02-28 DIAGNOSIS — N1831 Chronic kidney disease, stage 3a: Secondary | ICD-10-CM | POA: Diagnosis not present

## 2024-02-28 DIAGNOSIS — D638 Anemia in other chronic diseases classified elsewhere: Secondary | ICD-10-CM | POA: Diagnosis not present

## 2024-02-28 DIAGNOSIS — N3000 Acute cystitis without hematuria: Secondary | ICD-10-CM | POA: Diagnosis not present

## 2024-03-02 DIAGNOSIS — R262 Difficulty in walking, not elsewhere classified: Secondary | ICD-10-CM | POA: Diagnosis not present

## 2024-03-02 DIAGNOSIS — N3000 Acute cystitis without hematuria: Secondary | ICD-10-CM | POA: Diagnosis not present

## 2024-03-02 DIAGNOSIS — N400 Enlarged prostate without lower urinary tract symptoms: Secondary | ICD-10-CM | POA: Diagnosis not present

## 2024-03-02 DIAGNOSIS — E785 Hyperlipidemia, unspecified: Secondary | ICD-10-CM | POA: Diagnosis not present

## 2024-03-02 DIAGNOSIS — N321 Vesicointestinal fistula: Secondary | ICD-10-CM | POA: Diagnosis not present

## 2024-03-02 DIAGNOSIS — D638 Anemia in other chronic diseases classified elsewhere: Secondary | ICD-10-CM | POA: Diagnosis not present

## 2024-03-02 DIAGNOSIS — N139 Obstructive and reflux uropathy, unspecified: Secondary | ICD-10-CM | POA: Diagnosis not present

## 2024-03-02 DIAGNOSIS — N1831 Chronic kidney disease, stage 3a: Secondary | ICD-10-CM | POA: Diagnosis not present

## 2024-03-02 DIAGNOSIS — K219 Gastro-esophageal reflux disease without esophagitis: Secondary | ICD-10-CM | POA: Diagnosis not present

## 2024-03-03 DIAGNOSIS — K219 Gastro-esophageal reflux disease without esophagitis: Secondary | ICD-10-CM | POA: Diagnosis not present

## 2024-03-03 DIAGNOSIS — N1831 Chronic kidney disease, stage 3a: Secondary | ICD-10-CM | POA: Diagnosis not present

## 2024-03-03 DIAGNOSIS — N400 Enlarged prostate without lower urinary tract symptoms: Secondary | ICD-10-CM | POA: Diagnosis not present

## 2024-03-03 DIAGNOSIS — N321 Vesicointestinal fistula: Secondary | ICD-10-CM | POA: Diagnosis not present

## 2024-03-03 DIAGNOSIS — E785 Hyperlipidemia, unspecified: Secondary | ICD-10-CM | POA: Diagnosis not present

## 2024-03-03 DIAGNOSIS — R262 Difficulty in walking, not elsewhere classified: Secondary | ICD-10-CM | POA: Diagnosis not present

## 2024-03-03 DIAGNOSIS — D638 Anemia in other chronic diseases classified elsewhere: Secondary | ICD-10-CM | POA: Diagnosis not present

## 2024-03-03 DIAGNOSIS — N139 Obstructive and reflux uropathy, unspecified: Secondary | ICD-10-CM | POA: Diagnosis not present

## 2024-03-03 DIAGNOSIS — N3000 Acute cystitis without hematuria: Secondary | ICD-10-CM | POA: Diagnosis not present

## 2024-03-04 DIAGNOSIS — N400 Enlarged prostate without lower urinary tract symptoms: Secondary | ICD-10-CM | POA: Diagnosis not present

## 2024-03-04 DIAGNOSIS — N321 Vesicointestinal fistula: Secondary | ICD-10-CM | POA: Diagnosis not present

## 2024-03-04 DIAGNOSIS — R41841 Cognitive communication deficit: Secondary | ICD-10-CM | POA: Diagnosis not present

## 2024-03-04 DIAGNOSIS — E785 Hyperlipidemia, unspecified: Secondary | ICD-10-CM | POA: Diagnosis not present

## 2024-03-04 DIAGNOSIS — R1312 Dysphagia, oropharyngeal phase: Secondary | ICD-10-CM | POA: Diagnosis not present

## 2024-03-04 DIAGNOSIS — N139 Obstructive and reflux uropathy, unspecified: Secondary | ICD-10-CM | POA: Diagnosis not present

## 2024-03-04 DIAGNOSIS — K5732 Diverticulitis of large intestine without perforation or abscess without bleeding: Secondary | ICD-10-CM | POA: Diagnosis not present

## 2024-03-04 DIAGNOSIS — N1831 Chronic kidney disease, stage 3a: Secondary | ICD-10-CM | POA: Diagnosis not present

## 2024-03-04 DIAGNOSIS — N3000 Acute cystitis without hematuria: Secondary | ICD-10-CM | POA: Diagnosis not present

## 2024-03-04 DIAGNOSIS — M6281 Muscle weakness (generalized): Secondary | ICD-10-CM | POA: Diagnosis not present

## 2024-03-04 DIAGNOSIS — R262 Difficulty in walking, not elsewhere classified: Secondary | ICD-10-CM | POA: Diagnosis not present

## 2024-03-04 DIAGNOSIS — K219 Gastro-esophageal reflux disease without esophagitis: Secondary | ICD-10-CM | POA: Diagnosis not present

## 2024-03-04 DIAGNOSIS — D638 Anemia in other chronic diseases classified elsewhere: Secondary | ICD-10-CM | POA: Diagnosis not present

## 2024-03-05 DIAGNOSIS — M6281 Muscle weakness (generalized): Secondary | ICD-10-CM | POA: Diagnosis not present

## 2024-03-05 DIAGNOSIS — K5732 Diverticulitis of large intestine without perforation or abscess without bleeding: Secondary | ICD-10-CM | POA: Diagnosis not present

## 2024-03-05 DIAGNOSIS — N139 Obstructive and reflux uropathy, unspecified: Secondary | ICD-10-CM | POA: Diagnosis not present

## 2024-03-05 DIAGNOSIS — D638 Anemia in other chronic diseases classified elsewhere: Secondary | ICD-10-CM | POA: Diagnosis not present

## 2024-03-05 DIAGNOSIS — R41841 Cognitive communication deficit: Secondary | ICD-10-CM | POA: Diagnosis not present

## 2024-03-05 DIAGNOSIS — N1831 Chronic kidney disease, stage 3a: Secondary | ICD-10-CM | POA: Diagnosis not present

## 2024-03-05 DIAGNOSIS — R262 Difficulty in walking, not elsewhere classified: Secondary | ICD-10-CM | POA: Diagnosis not present

## 2024-03-05 DIAGNOSIS — R1312 Dysphagia, oropharyngeal phase: Secondary | ICD-10-CM | POA: Diagnosis not present

## 2024-03-05 DIAGNOSIS — N400 Enlarged prostate without lower urinary tract symptoms: Secondary | ICD-10-CM | POA: Diagnosis not present

## 2024-03-05 DIAGNOSIS — K219 Gastro-esophageal reflux disease without esophagitis: Secondary | ICD-10-CM | POA: Diagnosis not present

## 2024-03-05 DIAGNOSIS — N321 Vesicointestinal fistula: Secondary | ICD-10-CM | POA: Diagnosis not present

## 2024-03-05 DIAGNOSIS — N3000 Acute cystitis without hematuria: Secondary | ICD-10-CM | POA: Diagnosis not present

## 2024-03-05 DIAGNOSIS — E785 Hyperlipidemia, unspecified: Secondary | ICD-10-CM | POA: Diagnosis not present

## 2024-03-06 ENCOUNTER — Emergency Department (HOSPITAL_COMMUNITY)

## 2024-03-06 ENCOUNTER — Inpatient Hospital Stay (HOSPITAL_COMMUNITY)

## 2024-03-06 ENCOUNTER — Inpatient Hospital Stay (HOSPITAL_COMMUNITY)
Admission: EM | Admit: 2024-03-06 | Discharge: 2024-03-20 | DRG: 853 | Disposition: A | Discharge status: Skilled Nursing Facility | Source: Skilled Nursing Facility | Attending: Internal Medicine | Admitting: Internal Medicine

## 2024-03-06 DIAGNOSIS — I420 Dilated cardiomyopathy: Secondary | ICD-10-CM | POA: Diagnosis present

## 2024-03-06 DIAGNOSIS — I5022 Chronic systolic (congestive) heart failure: Secondary | ICD-10-CM | POA: Diagnosis not present

## 2024-03-06 DIAGNOSIS — E860 Dehydration: Secondary | ICD-10-CM | POA: Diagnosis not present

## 2024-03-06 DIAGNOSIS — K219 Gastro-esophageal reflux disease without esophagitis: Secondary | ICD-10-CM | POA: Diagnosis present

## 2024-03-06 DIAGNOSIS — E785 Hyperlipidemia, unspecified: Secondary | ICD-10-CM | POA: Diagnosis not present

## 2024-03-06 DIAGNOSIS — Z8711 Personal history of peptic ulcer disease: Secondary | ICD-10-CM

## 2024-03-06 DIAGNOSIS — N1831 Chronic kidney disease, stage 3a: Secondary | ICD-10-CM | POA: Diagnosis not present

## 2024-03-06 DIAGNOSIS — I10 Essential (primary) hypertension: Secondary | ICD-10-CM | POA: Diagnosis present

## 2024-03-06 DIAGNOSIS — G4733 Obstructive sleep apnea (adult) (pediatric): Secondary | ICD-10-CM | POA: Diagnosis present

## 2024-03-06 DIAGNOSIS — I129 Hypertensive chronic kidney disease with stage 1 through stage 4 chronic kidney disease, or unspecified chronic kidney disease: Secondary | ICD-10-CM | POA: Diagnosis not present

## 2024-03-06 DIAGNOSIS — F0390 Unspecified dementia without behavioral disturbance: Secondary | ICD-10-CM | POA: Diagnosis present

## 2024-03-06 DIAGNOSIS — D638 Anemia in other chronic diseases classified elsewhere: Secondary | ICD-10-CM | POA: Diagnosis not present

## 2024-03-06 DIAGNOSIS — K5900 Constipation, unspecified: Secondary | ICD-10-CM | POA: Diagnosis not present

## 2024-03-06 DIAGNOSIS — N139 Obstructive and reflux uropathy, unspecified: Secondary | ICD-10-CM | POA: Diagnosis not present

## 2024-03-06 DIAGNOSIS — N390 Urinary tract infection, site not specified: Secondary | ICD-10-CM | POA: Diagnosis present

## 2024-03-06 DIAGNOSIS — G9341 Metabolic encephalopathy: Secondary | ICD-10-CM | POA: Diagnosis not present

## 2024-03-06 DIAGNOSIS — Z8249 Family history of ischemic heart disease and other diseases of the circulatory system: Secondary | ICD-10-CM

## 2024-03-06 DIAGNOSIS — J8410 Pulmonary fibrosis, unspecified: Secondary | ICD-10-CM | POA: Diagnosis not present

## 2024-03-06 DIAGNOSIS — Z79899 Other long term (current) drug therapy: Secondary | ICD-10-CM

## 2024-03-06 DIAGNOSIS — R0989 Other specified symptoms and signs involving the circulatory and respiratory systems: Secondary | ICD-10-CM | POA: Diagnosis not present

## 2024-03-06 DIAGNOSIS — N401 Enlarged prostate with lower urinary tract symptoms: Secondary | ICD-10-CM | POA: Diagnosis present

## 2024-03-06 DIAGNOSIS — N3000 Acute cystitis without hematuria: Secondary | ICD-10-CM | POA: Diagnosis not present

## 2024-03-06 DIAGNOSIS — R918 Other nonspecific abnormal finding of lung field: Secondary | ICD-10-CM | POA: Diagnosis not present

## 2024-03-06 DIAGNOSIS — K2100 Gastro-esophageal reflux disease with esophagitis, without bleeding: Secondary | ICD-10-CM | POA: Diagnosis present

## 2024-03-06 DIAGNOSIS — R0602 Shortness of breath: Secondary | ICD-10-CM | POA: Diagnosis not present

## 2024-03-06 DIAGNOSIS — I878 Other specified disorders of veins: Secondary | ICD-10-CM | POA: Diagnosis not present

## 2024-03-06 DIAGNOSIS — J9811 Atelectasis: Secondary | ICD-10-CM | POA: Diagnosis not present

## 2024-03-06 DIAGNOSIS — Z432 Encounter for attention to ileostomy: Secondary | ICD-10-CM

## 2024-03-06 DIAGNOSIS — E440 Moderate protein-calorie malnutrition: Secondary | ICD-10-CM | POA: Diagnosis present

## 2024-03-06 DIAGNOSIS — D62 Acute posthemorrhagic anemia: Secondary | ICD-10-CM | POA: Diagnosis not present

## 2024-03-06 DIAGNOSIS — Z781 Physical restraint status: Secondary | ICD-10-CM

## 2024-03-06 DIAGNOSIS — F0393 Unspecified dementia, unspecified severity, with mood disturbance: Secondary | ICD-10-CM | POA: Diagnosis present

## 2024-03-06 DIAGNOSIS — N179 Acute kidney failure, unspecified: Secondary | ICD-10-CM

## 2024-03-06 DIAGNOSIS — Z87898 Personal history of other specified conditions: Secondary | ICD-10-CM

## 2024-03-06 DIAGNOSIS — A4159 Other Gram-negative sepsis: Secondary | ICD-10-CM | POA: Diagnosis not present

## 2024-03-06 DIAGNOSIS — R262 Difficulty in walking, not elsewhere classified: Secondary | ICD-10-CM | POA: Diagnosis not present

## 2024-03-06 DIAGNOSIS — F1021 Alcohol dependence, in remission: Secondary | ICD-10-CM | POA: Diagnosis present

## 2024-03-06 DIAGNOSIS — N1330 Unspecified hydronephrosis: Secondary | ICD-10-CM | POA: Diagnosis not present

## 2024-03-06 DIAGNOSIS — K8689 Other specified diseases of pancreas: Secondary | ICD-10-CM | POA: Diagnosis not present

## 2024-03-06 DIAGNOSIS — D649 Anemia, unspecified: Secondary | ICD-10-CM | POA: Diagnosis not present

## 2024-03-06 DIAGNOSIS — K449 Diaphragmatic hernia without obstruction or gangrene: Secondary | ICD-10-CM | POA: Diagnosis not present

## 2024-03-06 DIAGNOSIS — F3130 Bipolar disorder, current episode depressed, mild or moderate severity, unspecified: Secondary | ICD-10-CM | POA: Diagnosis present

## 2024-03-06 DIAGNOSIS — R911 Solitary pulmonary nodule: Secondary | ICD-10-CM | POA: Diagnosis not present

## 2024-03-06 DIAGNOSIS — Z9889 Other specified postprocedural states: Secondary | ICD-10-CM | POA: Diagnosis not present

## 2024-03-06 DIAGNOSIS — R531 Weakness: Secondary | ICD-10-CM | POA: Diagnosis not present

## 2024-03-06 DIAGNOSIS — Z8616 Personal history of COVID-19: Secondary | ICD-10-CM | POA: Diagnosis not present

## 2024-03-06 DIAGNOSIS — N321 Vesicointestinal fistula: Secondary | ICD-10-CM | POA: Diagnosis not present

## 2024-03-06 DIAGNOSIS — B961 Klebsiella pneumoniae [K. pneumoniae] as the cause of diseases classified elsewhere: Secondary | ICD-10-CM | POA: Diagnosis present

## 2024-03-06 DIAGNOSIS — G9340 Encephalopathy, unspecified: Secondary | ICD-10-CM

## 2024-03-06 DIAGNOSIS — R410 Disorientation, unspecified: Secondary | ICD-10-CM | POA: Diagnosis not present

## 2024-03-06 DIAGNOSIS — E43 Unspecified severe protein-calorie malnutrition: Secondary | ICD-10-CM | POA: Diagnosis present

## 2024-03-06 DIAGNOSIS — I130 Hypertensive heart and chronic kidney disease with heart failure and stage 1 through stage 4 chronic kidney disease, or unspecified chronic kidney disease: Secondary | ICD-10-CM | POA: Diagnosis not present

## 2024-03-06 DIAGNOSIS — Z980 Intestinal bypass and anastomosis status: Secondary | ICD-10-CM | POA: Diagnosis not present

## 2024-03-06 DIAGNOSIS — Z932 Ileostomy status: Secondary | ICD-10-CM

## 2024-03-06 DIAGNOSIS — E871 Hypo-osmolality and hyponatremia: Secondary | ICD-10-CM | POA: Diagnosis present

## 2024-03-06 DIAGNOSIS — K921 Melena: Secondary | ICD-10-CM

## 2024-03-06 DIAGNOSIS — K5730 Diverticulosis of large intestine without perforation or abscess without bleeding: Secondary | ICD-10-CM | POA: Diagnosis not present

## 2024-03-06 DIAGNOSIS — G928 Other toxic encephalopathy: Secondary | ICD-10-CM | POA: Diagnosis present

## 2024-03-06 DIAGNOSIS — K5732 Diverticulitis of large intestine without perforation or abscess without bleeding: Secondary | ICD-10-CM | POA: Diagnosis not present

## 2024-03-06 DIAGNOSIS — K922 Gastrointestinal hemorrhage, unspecified: Secondary | ICD-10-CM | POA: Diagnosis not present

## 2024-03-06 DIAGNOSIS — E875 Hyperkalemia: Secondary | ICD-10-CM | POA: Diagnosis present

## 2024-03-06 DIAGNOSIS — E872 Acidosis, unspecified: Secondary | ICD-10-CM | POA: Diagnosis not present

## 2024-03-06 DIAGNOSIS — D631 Anemia in chronic kidney disease: Secondary | ICD-10-CM | POA: Diagnosis present

## 2024-03-06 DIAGNOSIS — M6281 Muscle weakness (generalized): Secondary | ICD-10-CM | POA: Diagnosis not present

## 2024-03-06 DIAGNOSIS — N320 Bladder-neck obstruction: Secondary | ICD-10-CM | POA: Diagnosis present

## 2024-03-06 DIAGNOSIS — N3289 Other specified disorders of bladder: Secondary | ICD-10-CM | POA: Diagnosis present

## 2024-03-06 DIAGNOSIS — Z85828 Personal history of other malignant neoplasm of skin: Secondary | ICD-10-CM

## 2024-03-06 DIAGNOSIS — Z87440 Personal history of urinary (tract) infections: Secondary | ICD-10-CM

## 2024-03-06 DIAGNOSIS — R41841 Cognitive communication deficit: Secondary | ICD-10-CM | POA: Diagnosis not present

## 2024-03-06 DIAGNOSIS — R195 Other fecal abnormalities: Secondary | ICD-10-CM | POA: Diagnosis not present

## 2024-03-06 DIAGNOSIS — I959 Hypotension, unspecified: Secondary | ICD-10-CM | POA: Diagnosis present

## 2024-03-06 DIAGNOSIS — R5383 Other fatigue: Secondary | ICD-10-CM | POA: Diagnosis not present

## 2024-03-06 DIAGNOSIS — R1312 Dysphagia, oropharyngeal phase: Secondary | ICD-10-CM | POA: Diagnosis not present

## 2024-03-06 DIAGNOSIS — Z978 Presence of other specified devices: Secondary | ICD-10-CM

## 2024-03-06 DIAGNOSIS — F0394 Unspecified dementia, unspecified severity, with anxiety: Secondary | ICD-10-CM | POA: Diagnosis present

## 2024-03-06 DIAGNOSIS — N183 Chronic kidney disease, stage 3 unspecified: Secondary | ICD-10-CM | POA: Diagnosis not present

## 2024-03-06 DIAGNOSIS — I110 Hypertensive heart disease with heart failure: Secondary | ICD-10-CM | POA: Diagnosis not present

## 2024-03-06 DIAGNOSIS — Z433 Encounter for attention to colostomy: Secondary | ICD-10-CM | POA: Diagnosis not present

## 2024-03-06 DIAGNOSIS — N400 Enlarged prostate without lower urinary tract symptoms: Secondary | ICD-10-CM | POA: Diagnosis not present

## 2024-03-06 HISTORY — DX: Encephalopathy, unspecified: G93.40

## 2024-03-06 HISTORY — DX: Acute kidney failure, unspecified: N17.9

## 2024-03-06 LAB — COMPREHENSIVE METABOLIC PANEL WITH GFR
ALT: 8 U/L (ref 0–44)
AST: 17 U/L (ref 15–41)
Albumin: 3 g/dL — ABNORMAL LOW (ref 3.5–5.0)
Alkaline Phosphatase: 87 U/L (ref 38–126)
Anion gap: 13 (ref 5–15)
BUN: 71 mg/dL — ABNORMAL HIGH (ref 8–23)
CO2: 18 mmol/L — ABNORMAL LOW (ref 22–32)
Calcium: 8.8 mg/dL — ABNORMAL LOW (ref 8.9–10.3)
Chloride: 100 mmol/L (ref 98–111)
Creatinine, Ser: 5.49 mg/dL — ABNORMAL HIGH (ref 0.61–1.24)
GFR, Estimated: 10 mL/min — ABNORMAL LOW (ref >60)
Glucose, Bld: 96 mg/dL (ref 70–99)
Potassium: 5.7 mmol/L — ABNORMAL HIGH (ref 3.5–5.1)
Sodium: 131 mmol/L — ABNORMAL LOW (ref 135–145)
Total Bilirubin: 0.2 mg/dL (ref 0.0–1.2)
Total Protein: 6.4 g/dL — ABNORMAL LOW (ref 6.5–8.1)

## 2024-03-06 LAB — URINALYSIS, W/ REFLEX TO CULTURE (INFECTION SUSPECTED)
Bilirubin Urine: NEGATIVE
Glucose, UA: NEGATIVE mg/dL
Ketones, ur: NEGATIVE mg/dL
Nitrite: NEGATIVE
Protein, ur: 100 mg/dL — AB
RBC / HPF: >50 RBC/hpf (ref 0–5)
Specific Gravity, Urine: 1.006 (ref 1.005–1.030)
WBC, UA: >50 WBC/hpf (ref 0–5)
pH: 6 (ref 5.0–8.0)

## 2024-03-06 LAB — I-STAT CHEM 8, ED
BUN: 72 mg/dL — ABNORMAL HIGH (ref 8–23)
Calcium, Ion: 1.18 mmol/L (ref 1.15–1.40)
Chloride: 101 mmol/L (ref 98–111)
Creatinine, Ser: 6.3 mg/dL — ABNORMAL HIGH (ref 0.61–1.24)
Glucose, Bld: 100 mg/dL — ABNORMAL HIGH (ref 70–99)
HCT: 24 % — ABNORMAL LOW (ref 39.0–52.0)
Hemoglobin: 8.2 g/dL — ABNORMAL LOW (ref 13.0–17.0)
Potassium: 5.5 mmol/L — ABNORMAL HIGH (ref 3.5–5.1)
Sodium: 129 mmol/L — ABNORMAL LOW (ref 135–145)
TCO2: 20 mmol/L — ABNORMAL LOW (ref 22–32)

## 2024-03-06 LAB — I-STAT CG4 LACTIC ACID, ED: Lactic Acid, Venous: 0.8 mmol/L (ref 0.5–1.9)

## 2024-03-06 LAB — CBC WITH DIFFERENTIAL/PLATELET
Abs Immature Granulocytes: 0.08 K/uL — ABNORMAL HIGH (ref 0.00–0.07)
Basophils Absolute: 0.1 K/uL (ref 0.0–0.1)
Basophils Relative: 1 %
Eosinophils Absolute: 0.1 K/uL (ref 0.0–0.5)
Eosinophils Relative: 1 %
HCT: 26.2 % — ABNORMAL LOW (ref 39.0–52.0)
Hemoglobin: 7.7 g/dL — ABNORMAL LOW (ref 13.0–17.0)
Immature Granulocytes: 1 %
Lymphocytes Relative: 10 %
Lymphs Abs: 0.7 K/uL (ref 0.7–4.0)
MCH: 26 pg (ref 26.0–34.0)
MCHC: 29.4 g/dL — ABNORMAL LOW (ref 30.0–36.0)
MCV: 88.5 fL (ref 80.0–100.0)
Monocytes Absolute: 0.5 K/uL (ref 0.1–1.0)
Monocytes Relative: 7 %
Neutro Abs: 5.4 K/uL (ref 1.7–7.7)
Neutrophils Relative %: 80 %
Platelets: 192 K/uL (ref 150–400)
RBC: 2.96 MIL/uL — ABNORMAL LOW (ref 4.22–5.81)
RDW: 15.3 % (ref 11.5–15.5)
WBC: 6.8 K/uL (ref 4.0–10.5)
nRBC: 0 % (ref 0.0–0.2)

## 2024-03-06 LAB — CBG MONITORING, ED: Glucose-Capillary: 78 mg/dL (ref 70–99)

## 2024-03-06 LAB — AMMONIA: Ammonia: 15 umol/L (ref 9–35)

## 2024-03-06 LAB — VITAMIN B12: Vitamin B-12: 365 pg/mL (ref 180–914)

## 2024-03-06 LAB — TYPE AND SCREEN
ABO/RH(D): O POS
Antibody Screen: NEGATIVE

## 2024-03-06 LAB — TSH: TSH: 1.23 u[IU]/mL (ref 0.350–4.500)

## 2024-03-06 LAB — SODIUM, URINE, RANDOM: Sodium, Ur: 38 mmol/L

## 2024-03-06 LAB — CREATININE, URINE, RANDOM: Creatinine, Urine: 79 mg/dL

## 2024-03-06 MED ORDER — DEXTROSE 50 % IV SOLN
1.0000 | Freq: Once | INTRAVENOUS | Status: AC
  Administered 2024-03-06: 50 mL via INTRAVENOUS
  Filled 2024-03-06: qty 50

## 2024-03-06 MED ORDER — ACETAMINOPHEN 325 MG PO TABS
650.0000 mg | ORAL_TABLET | Freq: Four times a day (QID) | ORAL | Status: DC | PRN
  Administered 2024-03-09: 650 mg via ORAL
  Filled 2024-03-06: qty 2

## 2024-03-06 MED ORDER — SODIUM CHLORIDE 0.9 % IV SOLN
1.0000 g | INTRAVENOUS | Status: DC
  Administered 2024-03-06: 1 g via INTRAVENOUS
  Filled 2024-03-06: qty 10

## 2024-03-06 MED ORDER — LACTATED RINGERS IV SOLN: INTRAVENOUS | Status: DC

## 2024-03-06 MED ORDER — CALCIUM GLUCONATE-NACL 1-0.675 GM/50ML-% IV SOLN
1.0000 g | Freq: Once | INTRAVENOUS | Status: AC
  Administered 2024-03-06: 1000 mg via INTRAVENOUS
  Filled 2024-03-06: qty 50

## 2024-03-06 MED ORDER — LACTATED RINGERS IV SOLN: INTRAVENOUS | Status: AC

## 2024-03-06 MED ORDER — METOPROLOL TARTRATE 5 MG/5ML IV SOLN: 2.5000 mg | Freq: Four times a day (QID) | INTRAVENOUS | Status: DC | PRN

## 2024-03-06 MED ORDER — ACETAMINOPHEN 650 MG RE SUPP: 650.0000 mg | Freq: Four times a day (QID) | RECTAL | Status: DC | PRN

## 2024-03-06 MED ORDER — SODIUM ZIRCONIUM CYCLOSILICATE 10 G PO PACK
10.0000 g | PACK | Freq: Once | ORAL | Status: AC
  Administered 2024-03-06: 10 g via ORAL
  Filled 2024-03-06: qty 1

## 2024-03-06 MED ORDER — HEPARIN SODIUM (PORCINE) 5000 UNIT/ML IJ SOLN
5000.0000 [IU] | Freq: Three times a day (TID) | INTRAMUSCULAR | Status: DC
  Administered 2024-03-07 – 2024-03-16 (×27): 5000 [IU] via SUBCUTANEOUS
  Filled 2024-03-06 (×29): qty 1

## 2024-03-06 MED ORDER — ONDANSETRON HCL 4 MG/2ML IJ SOLN: 4.0000 mg | Freq: Four times a day (QID) | INTRAMUSCULAR | Status: DC | PRN

## 2024-03-06 MED ORDER — ONDANSETRON HCL 4 MG PO TABS: 4.0000 mg | ORAL_TABLET | Freq: Four times a day (QID) | ORAL | Status: DC | PRN

## 2024-03-06 MED ORDER — INSULIN ASPART 100 UNIT/ML IV SOLN
5.0000 [IU] | Freq: Once | INTRAVENOUS | Status: AC
  Administered 2024-03-06: 5 [IU] via INTRAVENOUS
  Filled 2024-03-06: qty 0.05

## 2024-03-06 NOTE — H&P (Signed)
 History and Physical    Patient: Calvin Escobar FMW:[id number] DOB: [birth year] DOA: 03/06/2024 DOS: the patient was seen and examined on 03/06/2024 PCP: Calvin Escobar MATSU, MD  Patient coming from: SNF    Chief Complaint: AMS  HPI: [name] is a 73 y.o. 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 He was re admitted at beginning of September this year for sepsis secondary to UTI/gram negative bacteremia and AKI on CKD stage 3a. He presents today from his SNF for AMS x 2 days. History unable to be obtained from patient. Per chart review SNF also reported hypotension.   He is unsure why he is here. He can't answer ROS and denies any pain, N/V.    ER Course:  vitals: afebrile, bp: 108/75, HR: 69, RR: 18, oxygen: 97%RA Pertinent labs: hgb: 7.7, sodium: 131, potassium: 5.7, BUN: 71, creatinine 5.49, UA with many bacteria/>50 WBC,  CXR: very low bilateral lung volumes with bibasilar atelectasis  In ED: given insulin , dextrose , calcium  and lokelma . Started on IVF. [facility] asked to admit    Review of Systems: unable to review all systems due to the inability of the patient to answer questions. Past Medical History:  Diagnosis Date   Acute cystitis 05/13/2023   Acute hyponatremia 05/13/2023   Acute kidney injury superimposed on chronic kidney disease 05/13/2023   CLINICAL CONTEXT: 73 year old male with stage 3 CKD, colovesical fistula, recurrent UTIs, and recent candidal UTI. Recent renal ultrasound (09/28/2023) confirmed mild left hydronephrosis and prostatic mass protruding into  bladder base. Iron  studies consistent with anemia of chronic disease (iron  32, TIBC 238, ferritin 420).   DIAGNOSTIC ASSESSMENT: ? 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 History of GI bleeding (07/2023). Currently on ferrous sulfate  supplementation.   DIAGNOSTIC ASSESSMENT: ? Primary etiology (70-80% probability): Anemia of chronic disease/inflammation ? 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  EF 35 to 40% on echo 04/2021   Dementia (HCC)    Depression    Diarrhea 05/13/2023   E coli bacteremia 05/14/2023   High anion gap metabolic acidosis 07/13/2023   History of Clostridioides difficile colitis 08/15/2023   Associated with colovesical fistula? Recurrent antibiotic(s) requirement     Hyperkalemia    Associated with losartan   Advised patient low potassium diet 08/2022             Lab Results      Component  Value    Date/Time           K    4.0    12/27/2023 01:20 PM           K    4.4    12/18/2023 05:07 AM           K    4.4    12/17/2023 06:01 AM           K    4.7    12/16/2023 06:15 AM           K    4.9    12/15/2023 08:21 AM           K    4.4     12/14/2023 06:00 AM           K    5.0    Hyperlipidemia    Hypertension    Leukocytosis 12/11/2023   Lab Results      Component    Value    Date/Time           WBC    6.2    12/27/2023 01:20 PM           WBC    11.6 (H)    12/18/2023 05:07 AM           WBC    9.6    12/17/2023 06:01 AM           WBC    9.4    12/16/2023 06:15 AM           WBC    12.7 (H)    12/15/2023 08:21 AM           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  Work-up with cardiology in January 2021.     catheterization    Assessment:     1. Normal coronaries  2. NICM with EF 25-30%  3. Normal hemodynamics  .     Pneumonia    PUD (peptic ulcer disease) 01/12/2015   Formatting of this note might be different from the original.  Endoscopy esophageal stricture 2016     Septic shock from UTI 06/02/2023   Severe sepsis (HCC) 06/03/2023   SIRS (systemic inflammatory response syndrome) (HCC) 12/11/2023   SKIN CANCER, HX OF 05/10/2007   Facial left check and forehead and r shoulder  F/w derm   Syncope 05/13/2023   Thrombocytopenia 06/02/2023   Lab Results      Component    Value    Date           PLT    301    07/19/2023           PLT    324    07/18/2023           PLT    268    07/16/2023           PLT    207    07/14/2023           PLT    228    07/13/2023             Lab Results      Component    Value    Date           ESRSEDRATE    60 (H)  05/08/2023     No results found for: CRP          Lab Results      Component    Value       Urinary catheter complication 06/24/2023   Urinary dribbling 03/14/2022   Urinary incontinence 11/21/2022      Urinary Incontinence: They are wearing disposable diapers daily due to urinary incontinence, primarily nocturnal, and are currently on Tamsulosin  (Flomax ). We will refer them to Urology for further evaluation and potential treatment options and continue Tamsulosin   until they are seen by Urology.     Urinary tract infection without hematuria 06/02/2023   UTI (urinary tract infection) 07/01/2023   Past Surgical History:  Procedure Laterality Date   COLECTOMY, SIGMOID, ROBOT-ASSISTED N/A 01/09/2024   Procedure: ROBOTIC LOW ANTERIOR RESECTION WITH ANASTOMOSIS, DRAINAGE OF PELVIC ABSCESS, COLOVESICAL FISTULA TAKEDOWN AND REPAIR, INTRAOPERATIVE ASSESSMENT OF TISSUE PERFUSION USING ICG DYE, AND BILATERAL TAP BLOCK;  Surgeon: Sheldon Standing, MD;  Location: [facility];  Service: General;  Laterality: N/A;  ROBOTIC RESECTION OF COLON RECTOSIGMOID   COLONOSCOPY     COLONOSCOPY N/A 12/05/2023   Procedure: COLONOSCOPY;  Surgeon: Leigh Standing SQUIBB, MD;  Location: [facility] ENDOSCOPY;  Service: Gastroenterology;  Laterality: N/A;   CYSTOSCOPY WITH INDOCYANINE GREEN  IMAGING (ICG) N/A 01/09/2024   Procedure: CYSTOSCOPY WITH INDOCYANINE GREEN  IMAGING (ICG);  Surgeon: Watt Rush, MD;  Location: [facility];  Service: Urology;  Laterality: N/A;  CYSTO WITH FIREFLY INJECTION   FLEXIBLE SIGMOIDOSCOPY N/A 01/09/2024   Procedure: SIGMOIDOSCOPY, FLEXIBLE;  Surgeon: Sheldon Standing, MD;  Location: [facility];  Service: General;  Laterality: N/A;   LAPAROSCOPIC LOOP COLOSTOMY N/A 01/09/2024   Procedure: CREATION OF DIVERTING LOOP ILEOSTOMY;  Surgeon: Sheldon Standing, MD;  Location: [facility];  Service: General;  Laterality: N/A;   RIGHT/LEFT HEART CATH AND CORONARY ANGIOGRAPHY N/A 07/03/2019   Procedure: RIGHT/LEFT HEART CATH AND CORONARY ANGIOGRAPHY;  Surgeon: Cherrie Toribio SAUNDERS, MD;  Location: [facility];  Service: Cardiovascular;  Laterality: N/A;   ROBOTIC ASSISTED LAPAROSCOPIC LYSIS OF ADHESION  01/09/2024   Procedure: LYSIS, ADHESIONS, ROBOT-ASSISTED, LAPAROSCOPIC;  Surgeon: Sheldon Standing, MD;  Location: [facility];  Service: General;;   Social History:  reports that he has never smoked. He has never used smokeless tobacco. He reports that he does not currently use alcohol. He reports that he does not use  drugs.  Allergies  Allergen Reactions   Albuterol  Palpitations and Other (See Comments)    Supraventricular Tachycardia     Family History  Problem Relation Age of Onset   Hypertension Mother    Cancer Father    Bone cancer Sister     Prior to Admission medications   Medication Sig Start Date End Date Taking? Authorizing Provider  ARIPiprazole  (ABILIFY ) 2 MG tablet Take 1 tablet (2 mg total) by mouth in the morning. 08/22/23   Calvin Escobar MATSU, MD  busPIRone  (BUSPAR ) 10 MG tablet Take 1 tablet (10 mg total) by mouth 3 (three) times daily. 09/16/23   Calvin Escobar MATSU, MD  carvedilol  (COREG ) 6.25 MG tablet Take 6.25 mg by mouth 2 (two) times daily with a meal.    [provider]  ferrous sulfate  325 (65 FE) MG tablet Take 1 tablet (325 mg total) by mouth 2 (two) times daily with a meal. 01/14/24   Sheldon Standing, MD  finasteride  (PROSCAR ) 5 MG tablet Take 5 mg by mouth daily. 11/06/23   [provider]  folic acid  (FOLVITE ) 1 MG tablet  Take 1 mg by mouth daily. 10/21/23   [provider]  gabapentin  (NEURONTIN ) 300 MG capsule Take 300 mg by mouth at bedtime. 01/01/24   [provider]  HYDROcodone -acetaminophen  (NORCO/VICODIN) 5-325 MG tablet Take 1 tablet by mouth every 6 (six) hours as needed for moderate pain (pain score 4-6) or severe pain (pain score 7-10). 02/09/24   Jillian Buttery, MD  loperamide  (IMODIUM ) 2 MG capsule Take 1 capsule (2 mg total) by mouth 2 (two) times daily. Patient not taking: Reported on 02/08/2024 01/14/24   Sheldon Standing, MD  loperamide  (IMODIUM ) 2 MG capsule Take 1-2 capsules (2-4 mg total) by mouth every 6 (six) hours as needed for diarrhea or loose stools (Use if >2 BM every 8 hours). 01/14/24   Sheldon Standing, MD  mirtazapine  (REMERON ) 30 MG tablet Take 1 tablet (30 mg total) by mouth at bedtime. 09/16/23   Calvin Escobar MATSU, MD  ondansetron  (ZOFRAN ) 4 MG tablet Take 4 mg by mouth every 8 (eight) hours as needed for nausea. 01/07/24    [provider]  polycarbophil (FIBERCON) 625 MG tablet Take 1 tablet (625 mg total) by mouth 2 (two) times daily. 01/14/24   Sheldon Standing, MD  pravastatin  (PRAVACHOL ) 20 MG tablet TAKE 1 TABLET BY MOUTH EVERYDAY AT BEDTIME Patient taking differently: Take 20 mg by mouth at bedtime. 01/29/24   Calvin Escobar MATSU, MD  primidone  (MYSOLINE ) 50 MG tablet Take 1 tablet (50 mg total) by mouth 2 (two) times daily. 06/11/23   Krishnan, Gokul, MD  sodium bicarbonate  650 MG tablet Take 2 tablets (1,300 mg total) by mouth 2 (two) times daily. 02/09/24   Jillian Buttery, MD  tamsulosin  (FLOMAX ) 0.4 MG CAPS capsule TAKE 1 CAPSULE BY MOUTH EVERY DAY 07/23/23   Calvin Escobar MATSU, MD  traZODone  (DESYREL ) 100 MG tablet Take 1 tablet (100 mg total) by mouth at bedtime. 07/19/23   Rai, Nydia POUR, MD  venlafaxine  XR (EFFEXOR -XR) 150 MG 24 hr capsule Take 150 mg by mouth daily. 10/04/23   [provider]  venlafaxine  XR (EFFEXOR -XR) 75 MG 24 hr capsule Take 75 mg by mouth daily. 11/11/23   [provider]    Physical Exam: Vitals:   03/06/24 1313 03/06/24 1320 03/06/24 1725  BP: 108/75 100/66   Pulse: 69 69   Resp: 18 18   Temp: 98.2 F (36.8 C)  (!) 97.3 F (36.3 C)  TempSrc: Oral  Oral  SpO2: 97% 96%    General:  Appears calm and comfortable and is in NAD Eyes:  PERRL, EOMI, normal lids, iris ENT:  grossly normal hearing, lips & tongue, dry mucous membranes; appropriate dentition Neck:  no LAD, masses or thyromegaly; no carotid bruits Cardiovascular:  RRR, no m/r/g. No LE edema.  Respiratory:   CTA bilaterally with no wheezes/rales/rhonchi.  Normal respiratory effort. Abdomen:  soft, NT, ND, NABS. Colostomy bag with output. Indwelling foley  Back:   normal alignment, no CVAT Skin:  no rash or induration seen on limited exam Musculoskeletal:  grossly normal tone BUE/BLE, good ROM, no bony abnormality Lower extremity:  No LE edema.  Limited foot exam with no ulcerations.  2+ distal  pulses. Psychiatric:  grossly normal mood and affect, speech fluent and appropriate, AO to self only  Neurologic:  CN 2-12 grossly intact, moves all extremities in coordinated fashion, sensation intact. Limited exam    Radiological Exams on Admission: Independently reviewed - see discussion in A/P where applicable  US  RENAL Result Date: 03/06/2024 CLINICAL  DATA:  [id number] AKI (acute kidney injury) [id number] EXAM: RENAL / URINARY TRACT ULTRASOUND COMPLETE COMPARISON:  02/05/2024, 09/28/2023 FINDINGS: Right Kidney: Renal measurements: 12.1 x 6.1 x 8.2 cm = volume: 313 mL.Normal echogenicity. No mass. Redemonstrated moderate to severe hydroureteronephrosis. No nephrolithiasis. Left Kidney: Renal measurements: 11.8 x 6.5 x 5.8 cm = volume: 234 mL. Normal echogenicity. No mass. Mild hydronephrosis. No nephrolithiasis. Bladder: Similar trabeculation of the urinary bladder wall. Other: None. IMPRESSION: 1. Similarly appearing bilateral hydronephrosis, moderate to severe on the right. 2. Trabeculation of the urinary bladder wall, which may be due to chronic bladder outlet obstruction. Electronically Signed   By: Rogelia Myers M.D.   On: 03/06/2024 17:14   DG Chest Port 1 View Result Date: 03/06/2024 CLINICAL DATA:  Shortness of breath, altered mental status and weakness. EXAM: PORTABLE CHEST 1 VIEW COMPARISON:  [id number] FINDINGS: The heart size and mediastinal contours are within normal limits. Very low bilateral lung volumes with bibasilar atelectasis. There is no evidence of pulmonary edema, consolidation, pneumothorax or pleural fluid. Stable calcified granuloma in the right lung. The visualized skeletal structures are unremarkable. IMPRESSION: Very low bilateral lung volumes with bibasilar atelectasis. Electronically Signed   By: Marcey Moan M.D.   On: 03/06/2024 14:51    EKG: Independently reviewed.  NSR with rate 69; nonspecific ST changes with no evidence of acute ischemia. PAC/LBBB (baseline)    Labs  on Admission: I have personally reviewed the available labs and imaging studies at the time of the admission.  Pertinent labs:   hgb: 7.7,  sodium: 131,  potassium: 5.7,  BUN: 71,  creatinine 5.49,  UA with many bacteria/>50 WBC,   Assessment and Plan: Principal Problem:   Acute kidney injury superimposed on stage 3a chronic kidney disease (HCC) with hyperkalemia Active Problems:   Acute encephalopathy   Anemia of chronic disease   Hypertension   Recurrent UTI   Diverticulitis of sigmoid colon s/p robotic colectomy 01/09/2024   Chronic HFrEF (heart failure with reduced ejection fraction) (HCC)   Chronic indwelling Foley catheter   Bipolar affective disorder, depressed (HCC)   Dementia (HCC)   GERD (gastroesophageal reflux disease)   HLD (hyperlipidemia)   OSA on CPAP   Hyperkalemia   Ileostomy in place [facility])   Chronic hyponatremia    Assessment and Plan: * Acute kidney injury superimposed on stage 3a chronic kidney disease (HCC) with hyperkalemia 73 year old male presenting to ED from SNF with concerns for AMS x 2 days and hypotension found to have acute on chronic kidney injury with hyperkalemia  -admit to [facility] progressive -nephrology consulted, Dr. Windle  -baseline creatinine varies, five months ago was 1.1-1.2 and has ranged from 1.5-2.2, now presenting with creatinine of 5.49 and BUN of 71 and hyperkalemic with potassium of 5.7. no changes on EKG  -check urine studies, strict I/O and check renal US  -reported hx of hypotension so possibly some hypoperfusion. Foley has output and bladder scan was 100cc.  -maintain MAP >65 -hold nephrotoxic drugs -was recently hospitalized with AKI due to high output ostomy. No hx so unsure if this is contributing again  -gentle IVF at 75cc/hour x 12 hours -urine suspicious for UTI, on rocephin  and culture pending  -lokelma , insulin , calcium  and dextrose  given for hyperkalemia. Continue to monitor on tele and will repeat bmp.  -trend    Acute encephalopathy -hx of dementia and unsure of baseline -Likely secondary to uremia from AKI on CKD, but also urine suspicious for UTI  -cultures pending.  No SIRS criteria, lactic acid wnl  -check other metabolic labs -CT head with no acute finding  -fall and delirium precautions   Anemia of chronic disease Baseline hgb 7-8. No signs of bleeding  Iron  studies showed low iron  in 02/2024 and given IV iron  on 9/5.   Type and screen to keep hgb >7  Continue oral iron  supplementation   Recurrent UTI History of recurrent UTI with Pseudomonas, Enterococcus, Candida, E. coli.  Sepsis secondary to UTI/gram negative bacteremia 9/7. Cultures grew Klebsiella Now with acute encephalopathy and urine suspicious for UTI Treat with rocephin , f/u on cultures and trend PCT  Recent cultures sensitive to rocephin    Hypertension Has reported hypotension, pressures soft to normotensive here Hold coreg  Trend   Diverticulitis of sigmoid colon s/p robotic colectomy 01/09/2024 Continue colostomy care High out put colostomy per last hospitalization  Started on fiber/imodium  PRN   Chronic HFrEF (heart failure with reduced ejection fraction) (HCC) Appears dry on exam  Strict I/O Judicious, time limited IVF Echo 12/2023: EF: 25-30% severely decreased LVF. Mild to moderate MV regurgitation. Diastolic indeterminate   Chronic indwelling Foley catheter Foley appears to be working, 100cc of urine on bladder scan Foley care   Bipolar affective disorder, depressed (HCC) Working on getting med rec reconciled  On d/c a few weeks ago on mirtazapine , buspirone , abilify .  Hold gabapentin  with aKI   Dementia (HCC) Delirium precautions   GERD (gastroesophageal reflux disease) Continue PPI   HLD (hyperlipidemia) Continue statin   OSA on CPAP Can not get a hold of family, unsure of cpap   Chronic hyponatremia At baseline,continue to trend       Advance Care Planning:   Code Status: Full Code    Consults: nephrology: Dr. Windle   DVT Prophylaxis: heparin  [place]   Family Communication: attempted to call his wife, left VM   Severity of Illness: The appropriate patient status for this patient is INPATIENT. Inpatient status is judged to be reasonable and necessary in order to provide the required intensity of service to ensure the patient's safety. The patient's presenting symptoms, physical exam findings, and initial radiographic and laboratory data in the context of their chronic comorbidities is felt to place them at high risk for further clinical deterioration. Furthermore, it is not anticipated that the patient will be medically stable for discharge from the hospital within 2 midnights of admission.   * I certify that at the point of admission it is my clinical judgment that the patient will require inpatient hospital care spanning beyond 2 midnights from the point of admission due to high intensity of service, high risk for further deterioration and high frequency of surveillance required.*  Author: Isaiah Geralds, MD 03/06/2024 6:11 PM  For on call review [email or web address].

## 2024-03-06 NOTE — Assessment & Plan Note (Signed)
 Working on getting med rec reconciled  On d/c a few weeks ago on mirtazapine , buspirone , abilify .  Hold gabapentin  with aKI

## 2024-03-06 NOTE — Assessment & Plan Note (Signed)
 Continue PPI.

## 2024-03-06 NOTE — Assessment & Plan Note (Signed)
 Baseline hgb 7-8. No signs of bleeding  Iron  studies showed low iron  in 02/2024 and given IV iron  on 9/5.   Type and screen to keep hgb >7  Continue oral iron  supplementation

## 2024-03-06 NOTE — Assessment & Plan Note (Signed)
At baseline, continue to trend  

## 2024-03-06 NOTE — Assessment & Plan Note (Signed)
 Continue statin

## 2024-03-06 NOTE — Assessment & Plan Note (Signed)
 73 year old male presenting to ED from SNF with concerns for AMS x 2 days and hypotension found to have acute on chronic kidney injury with hyperkalemia  -admit to [facility] progressive -nephrology consulted, Dr. Windle  -baseline creatinine varies, five months ago was 1.1-1.2 and has ranged from 1.5-2.2, now presenting with creatinine of 5.49 and BUN of 71 and hyperkalemic with potassium of 5.7. no changes on EKG  -check urine studies, strict I/O and check renal US  -reported hx of hypotension so possibly some hypoperfusion. Foley has output and bladder scan was 100cc.  -maintain MAP >65 -hold nephrotoxic drugs -was recently hospitalized with AKI due to high output ostomy. No hx so unsure if this is contributing again  -gentle IVF at 75cc/hour x 12 hours -urine suspicious for UTI, on rocephin  and culture pending  -lokelma , insulin , calcium  and dextrose  given for hyperkalemia. Continue to monitor on tele and will repeat bmp.  -trend
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
## 2024-03-06 NOTE — Assessment & Plan Note (Addendum)
 History of recurrent UTI with Pseudomonas, Enterococcus, Candida, E. coli.  Sepsis secondary to UTI/gram negative bacteremia 9/7. Cultures grew Klebsiella Now with acute encephalopathy and urine suspicious for UTI Treat with rocephin , f/u on cultures and trend PCT  Recent cultures sensitive to rocephin 

## 2024-03-06 NOTE — Assessment & Plan Note (Signed)
-  hx of dementia and unsure of baseline -Likely secondary to uremia from AKI on CKD, but also urine suspicious for UTI  -cultures pending. No SIRS criteria, lactic acid wnl  -check other metabolic labs -CT head with no acute finding  -fall and delirium precautions

## 2024-03-06 NOTE — Assessment & Plan Note (Signed)
 Foley appears to be working, 100cc of urine on bladder scan Foley care

## 2024-03-06 NOTE — Assessment & Plan Note (Signed)
 Continue colostomy care High out put colostomy per last hospitalization  Started on fiber/imodium  PRN

## 2024-03-06 NOTE — Assessment & Plan Note (Signed)
 Has reported hypotension, pressures soft to normotensive here Hold coreg  Trend

## 2024-03-06 NOTE — Assessment & Plan Note (Signed)
 Delirium precautions

## 2024-03-06 NOTE — Assessment & Plan Note (Signed)
 Can not get a hold of family, unsure of cpap

## 2024-03-06 NOTE — Assessment & Plan Note (Signed)
 Appears dry on exam  Strict I/O Judicious, time limited IVF Echo 12/2023: EF: 25-30% severely decreased LVF. Mild to moderate MV regurgitation. Diastolic indeterminate

## 2024-03-06 NOTE — ED Triage Notes (Addendum)
 Pt BIBA from assisted living for altered mental status and weakness x 2 days.  Unsure of baseline.   Has foley and colostomy bag, Hx of UTI  Pt was hypotensive of 96/70  71 HR 93% RA  16RR 98.1 Temp Cbg 114

## 2024-03-06 NOTE — ED Provider Notes (Signed)
 [place] EMERGENCY DEPARTMENT AT [facility] Provider Note   CSN: [id number] Arrival date & time: 03/06/24  1305     Patient presents with: Hypotension and Altered Mental Status   Calvin Escobar is a 73 y.o. male.   73 year old male presents for altered mental status.  Patient does have a history of brain injury in the past and has some cognitive communication issues.  Today patient was found to have altered mental status weakness for the past 2 days.  No reported change in medications.  No reported fever or emesis.  No reported history of falls.  Patient recently had urosepsis.  He also has colostomy bag.  History of prior UTI.  EMS called and patient's blood pressure was 96/70.  He was afebrile.  CBG was 144.  Transported here for further management       Prior to Admission medications   Medication Sig Start Date End Date Taking? Authorizing Provider  ARIPiprazole  (ABILIFY ) 2 MG tablet Take 1 tablet (2 mg total) by mouth in the morning. 08/22/23   [name], MD  busPIRone  (BUSPAR ) 10 MG tablet Take 1 tablet (10 mg total) by mouth 3 (three) times daily. 09/16/23   [name], MD  carvedilol  (COREG ) 6.25 MG tablet Take 6.25 mg by mouth 2 (two) times daily with a meal.    [provider]  ferrous sulfate  325 (65 FE) MG tablet Take 1 tablet (325 mg total) by mouth 2 (two) times daily with a meal. 01/14/24   Sheldon Standing, MD  finasteride  (PROSCAR ) 5 MG tablet Take 5 mg by mouth daily. 11/06/23   [provider]  folic acid  (FOLVITE ) 1 MG tablet Take 1 mg by mouth daily. 10/21/23   [provider]  gabapentin  (NEURONTIN ) 300 MG capsule Take 300 mg by mouth at bedtime. 01/01/24   [provider]  HYDROcodone -acetaminophen  (NORCO/VICODIN) 5-325 MG tablet Take 1 tablet by mouth every 6 (six) hours as needed for moderate pain (pain score 4-6) or severe pain (pain score 7-10). 02/09/24   Jillian Buttery, MD  loperamide  (IMODIUM ) 2 MG  capsule Take 1 capsule (2 mg total) by mouth 2 (two) times daily. Patient not taking: Reported on 02/08/2024 01/14/24   Sheldon Standing, MD  loperamide  (IMODIUM ) 2 MG capsule Take 1-2 capsules (2-4 mg total) by mouth every 6 (six) hours as needed for diarrhea or loose stools (Use if >2 BM every 8 hours). 01/14/24   Sheldon Standing, MD  mirtazapine  (REMERON ) 30 MG tablet Take 1 tablet (30 mg total) by mouth at bedtime. 09/16/23   [name], MD  ondansetron  (ZOFRAN ) 4 MG tablet Take 4 mg by mouth every 8 (eight) hours as needed for nausea. 01/07/24   [provider]  polycarbophil (FIBERCON) 625 MG tablet Take 1 tablet (625 mg total) by mouth 2 (two) times daily. 01/14/24   Sheldon Standing, MD  pravastatin  (PRAVACHOL ) 20 MG tablet TAKE 1 TABLET BY MOUTH EVERYDAY AT BEDTIME Patient taking differently: Take 20 mg by mouth at bedtime. 01/29/24   [name], MD  primidone  (MYSOLINE ) 50 MG tablet Take 1 tablet (50 mg total) by mouth 2 (two) times daily. 06/11/23   Krishnan, Gokul, MD  sodium bicarbonate  650 MG tablet Take 2 tablets (1,300 mg total) by mouth 2 (two) times daily. 02/09/24   Jillian Buttery, MD  tamsulosin  (FLOMAX ) 0.4 MG CAPS capsule TAKE 1 CAPSULE BY MOUTH EVERY DAY 07/23/23   [name], MD  traZODone  (  DESYREL ) 100 MG tablet Take 1 tablet (100 mg total) by mouth at bedtime. 07/19/23   Rai, Nydia POUR, MD  venlafaxine  XR (EFFEXOR -XR) 150 MG 24 hr capsule Take 150 mg by mouth daily. 10/04/23   [provider]  venlafaxine  XR (EFFEXOR -XR) 75 MG 24 hr capsule Take 75 mg by mouth daily. 11/11/23   [provider]    Allergies: Albuterol     Review of Systems  All other systems reviewed and are negative.   Updated Vital Signs BP 100/66 (BP Location: Right Arm)   Pulse 69   Temp 98.2 F (36.8 C) (Oral)   Resp 18   SpO2 96%   Physical Exam Vitals and nursing note reviewed.  Constitutional:      General: He is not in acute distress.    Appearance: Normal  appearance. He is well-developed. He is not toxic-appearing.  HENT:     Head: Normocephalic and atraumatic.  Eyes:     General: Lids are normal.     Conjunctiva/sclera: Conjunctivae normal.     Pupils: Pupils are equal, round, and reactive to light.  Neck:     Thyroid : No thyroid  mass.     Trachea: No tracheal deviation.  Cardiovascular:     Rate and Rhythm: Normal rate and regular rhythm.     Heart sounds: Normal heart sounds. No murmur heard.    No gallop.  Pulmonary:     Effort: Pulmonary effort is normal. No respiratory distress.     Breath sounds: Normal breath sounds. No stridor. No decreased breath sounds, wheezing, rhonchi or rales.  Abdominal:     General: There is no distension.     Palpations: Abdomen is soft.     Tenderness: There is no abdominal tenderness. There is no rebound.  Musculoskeletal:        General: No tenderness. Normal range of motion.     Cervical back: Normal range of motion and neck supple.  Skin:    General: Skin is warm and dry.     Findings: No abrasion or rash.  Neurological:     General: No focal deficit present.     Mental Status: He is oriented to person, place, and time. He is lethargic.     GCS: GCS eye subscore is 4. GCS verbal subscore is 5. GCS motor subscore is 6.     Cranial Nerves: No cranial nerve deficit.     Sensory: No sensory deficit.     Motor: Motor function is intact.  Psychiatric:        Attention and Perception: Attention normal.        Mood and Affect: Affect is flat.        Speech: Speech is delayed.        Behavior: Behavior is withdrawn.     (all labs ordered are listed, but only abnormal results are displayed) Labs Reviewed  CULTURE, BLOOD (ROUTINE X 2)  CULTURE, BLOOD (ROUTINE X 2)  CBC WITH DIFFERENTIAL/PLATELET  COMPREHENSIVE METABOLIC PANEL WITH GFR  URINALYSIS, W/ REFLEX TO CULTURE (INFECTION SUSPECTED)  I-STAT CHEM 8, ED  I-STAT CG4 LACTIC ACID, ED    EKG: EKG Interpretation Date/Time:  Friday  March 06 2024 13:36:19 EDT Ventricular Rate:  69 PR Interval:  198 QRS Duration:  156 QT Interval:  428 QTC Calculation: 459 R Axis:   -34  Text Interpretation: Sinus rhythm Atrial premature complex Left bundle branch block No significant change since last tracing Confirmed by Dasie Faden ([id number]) on 03/06/2024  3:03:53 PM  Radiology: No results found.   Procedures   Medications Ordered in the ED  lactated ringers  infusion (has no administration in time range)                                    Medical Decision Making Amount and/or Complexity of Data Reviewed Labs: ordered. Radiology: ordered. ECG/medicine tests: ordered.  Risk Prescription drug management.   Patient is EKG shows normal sinus rhythm with a rate of 96.  Unchanged from prior.  Has evidence of acute kidney injury here.  Also has mild hyperkalemia potassium of 5.7.  Patient started on hyperkalemia order pathway.  Chest x-ray did not show any acute findings.  He has no evidence of bladder outlet obstruction with only 108 cc of fluid in his bladder.  Patient has mild anemia but this is around his baseline.  Foley catheter is in place.  Urinalysis is pending.  Patient will require inpatient hospitalization and will consult hospitalist team   CRITICAL CARE Performed by: Curtistine ONEIDA Dawn Total critical care time: 55 minutes Critical care time was exclusive of separately billable procedures and treating other patients. Critical care was necessary to treat or prevent imminent or life-threatening deterioration. Critical care was time spent personally by me on the following activities: development of treatment plan with patient and/or surrogate as well as nursing, discussions with consultants, evaluation of patient's response to treatment, examination of patient, obtaining history from patient or surrogate, ordering and performing treatments and interventions, ordering and review of laboratory studies, ordering and  review of radiographic studies, pulse oximetry and re-evaluation of patient's condition.      Final diagnoses:  None    ED Discharge Orders     None          Dawn Curtistine, MD 03/06/24 1506

## 2024-03-07 ENCOUNTER — Inpatient Hospital Stay (HOSPITAL_COMMUNITY)

## 2024-03-07 ENCOUNTER — Other Ambulatory Visit: Payer: Self-pay

## 2024-03-07 DIAGNOSIS — N179 Acute kidney failure, unspecified: Secondary | ICD-10-CM | POA: Diagnosis not present

## 2024-03-07 DIAGNOSIS — M6281 Muscle weakness (generalized): Secondary | ICD-10-CM | POA: Diagnosis not present

## 2024-03-07 DIAGNOSIS — N321 Vesicointestinal fistula: Secondary | ICD-10-CM | POA: Diagnosis not present

## 2024-03-07 DIAGNOSIS — N3000 Acute cystitis without hematuria: Secondary | ICD-10-CM | POA: Diagnosis not present

## 2024-03-07 DIAGNOSIS — R41841 Cognitive communication deficit: Secondary | ICD-10-CM | POA: Diagnosis not present

## 2024-03-07 DIAGNOSIS — D638 Anemia in other chronic diseases classified elsewhere: Secondary | ICD-10-CM | POA: Diagnosis not present

## 2024-03-07 DIAGNOSIS — N1831 Chronic kidney disease, stage 3a: Secondary | ICD-10-CM | POA: Diagnosis not present

## 2024-03-07 DIAGNOSIS — K219 Gastro-esophageal reflux disease without esophagitis: Secondary | ICD-10-CM | POA: Diagnosis not present

## 2024-03-07 DIAGNOSIS — R262 Difficulty in walking, not elsewhere classified: Secondary | ICD-10-CM | POA: Diagnosis not present

## 2024-03-07 DIAGNOSIS — N400 Enlarged prostate without lower urinary tract symptoms: Secondary | ICD-10-CM | POA: Diagnosis not present

## 2024-03-07 LAB — COMPREHENSIVE METABOLIC PANEL WITH GFR
ALT: 9 U/L (ref 0–44)
AST: 11 U/L — ABNORMAL LOW (ref 15–41)
Albumin: 2.2 g/dL — ABNORMAL LOW (ref 3.5–5.0)
Alkaline Phosphatase: 66 U/L (ref 38–126)
Anion gap: 13 (ref 5–15)
BUN: 68 mg/dL — ABNORMAL HIGH (ref 8–23)
CO2: 16 mmol/L — ABNORMAL LOW (ref 22–32)
Calcium: 8.2 mg/dL — ABNORMAL LOW (ref 8.9–10.3)
Chloride: 104 mmol/L (ref 98–111)
Creatinine, Ser: 5.52 mg/dL — ABNORMAL HIGH (ref 0.61–1.24)
GFR, Estimated: 10 mL/min — ABNORMAL LOW (ref >60)
Glucose, Bld: 89 mg/dL (ref 70–99)
Potassium: 5.3 mmol/L — ABNORMAL HIGH (ref 3.5–5.1)
Sodium: 133 mmol/L — ABNORMAL LOW (ref 135–145)
Total Bilirubin: 0.6 mg/dL (ref 0.0–1.2)
Total Protein: 6.1 g/dL — ABNORMAL LOW (ref 6.5–8.1)

## 2024-03-07 LAB — BRAIN NATRIURETIC PEPTIDE: B Natriuretic Peptide: 573.1 pg/mL — ABNORMAL HIGH (ref 0.0–100.0)

## 2024-03-07 LAB — MAGNESIUM: Magnesium: 1.6 mg/dL — ABNORMAL LOW (ref 1.7–2.4)

## 2024-03-07 LAB — PHOSPHORUS: Phosphorus: 5.2 mg/dL — ABNORMAL HIGH (ref 2.5–4.6)

## 2024-03-07 LAB — CBC
HCT: 25.7 % — ABNORMAL LOW (ref 39.0–52.0)
Hemoglobin: 8 g/dL — ABNORMAL LOW (ref 13.0–17.0)
MCH: 26.8 pg (ref 26.0–34.0)
MCHC: 31.1 g/dL (ref 30.0–36.0)
MCV: 86 fL (ref 80.0–100.0)
Platelets: 207 K/uL (ref 150–400)
RBC: 2.99 MIL/uL — ABNORMAL LOW (ref 4.22–5.81)
RDW: 15.4 % (ref 11.5–15.5)
WBC: 6.7 K/uL (ref 4.0–10.5)
nRBC: 0 % (ref 0.0–0.2)

## 2024-03-07 LAB — PROCALCITONIN
Procalcitonin: 3.33 ng/mL
Procalcitonin: 3.08 ng/mL

## 2024-03-07 LAB — AMMONIA: Ammonia: 20 umol/L (ref 9–35)

## 2024-03-07 LAB — C-REACTIVE PROTEIN: CRP: 21.9 mg/dL — ABNORMAL HIGH (ref <1.0)

## 2024-03-07 MED ORDER — STERILE WATER FOR INJECTION IV SOLN
INTRAVENOUS | Status: DC
  Filled 2024-03-07: qty 150
  Filled 2024-03-07 (×2): qty 1000

## 2024-03-07 MED ORDER — CHLORHEXIDINE GLUCONATE CLOTH 2 % EX PADS
6.0000 | MEDICATED_PAD | Freq: Every day | CUTANEOUS | Status: DC
  Administered 2024-03-07 – 2024-03-20 (×14): 6 via TOPICAL

## 2024-03-07 MED ORDER — SODIUM ZIRCONIUM CYCLOSILICATE 10 G PO PACK
10.0000 g | PACK | Freq: Three times a day (TID) | ORAL | Status: DC
  Administered 2024-03-07 – 2024-03-08 (×4): 10 g via ORAL
  Filled 2024-03-07 (×4): qty 1

## 2024-03-07 MED ORDER — AZITHROMYCIN 500 MG PO TABS
500.0000 mg | ORAL_TABLET | Freq: Every day | ORAL | Status: DC
Start: 2024-03-07 — End: 2024-03-12
  Administered 2024-03-07 – 2024-03-09 (×3): 500 mg via ORAL
  Filled 2024-03-07 (×3): qty 1

## 2024-03-07 MED ORDER — FOLIC ACID 1 MG PO TABS
1.0000 mg | ORAL_TABLET | Freq: Every day | ORAL | Status: DC
  Administered 2024-03-07 – 2024-03-20 (×14): 1 mg via ORAL
  Filled 2024-03-07 (×14): qty 1

## 2024-03-07 MED ORDER — SODIUM CHLORIDE 0.9 % IV SOLN
2.0000 g | INTRAVENOUS | Status: DC
  Administered 2024-03-07 – 2024-03-09 (×3): 2 g via INTRAVENOUS
  Filled 2024-03-07 (×3): qty 20

## 2024-03-07 MED ORDER — PANTOPRAZOLE SODIUM 40 MG PO TBEC
40.0000 mg | DELAYED_RELEASE_TABLET | Freq: Every day | ORAL | Status: DC
  Administered 2024-03-07 – 2024-03-16 (×10): 40 mg via ORAL
  Filled 2024-03-07 (×10): qty 1

## 2024-03-07 MED ORDER — TAMSULOSIN HCL 0.4 MG PO CAPS
0.4000 mg | ORAL_CAPSULE | Freq: Every day | ORAL | Status: DC
  Administered 2024-03-07 – 2024-03-20 (×14): 0.4 mg via ORAL
  Filled 2024-03-07 (×14): qty 1

## 2024-03-07 MED ORDER — MAGNESIUM SULFATE 4 GM/100ML IV SOLN
4.0000 g | Freq: Once | INTRAVENOUS | Status: AC
  Administered 2024-03-07: 4 g via INTRAVENOUS
  Filled 2024-03-07: qty 100

## 2024-03-07 MED ORDER — FINASTERIDE 5 MG PO TABS
5.0000 mg | ORAL_TABLET | Freq: Every day | ORAL | Status: DC
  Administered 2024-03-07 – 2024-03-20 (×14): 5 mg via ORAL
  Filled 2024-03-07 (×14): qty 1

## 2024-03-07 MED ORDER — BUSPIRONE HCL 5 MG PO TABS
10.0000 mg | ORAL_TABLET | Freq: Three times a day (TID) | ORAL | Status: DC
  Administered 2024-03-07 – 2024-03-20 (×39): 10 mg via ORAL
  Filled 2024-03-07 (×39): qty 2

## 2024-03-07 MED ORDER — SODIUM BICARBONATE 650 MG PO TABS
1300.0000 mg | ORAL_TABLET | Freq: Two times a day (BID) | ORAL | Status: DC
  Administered 2024-03-07 – 2024-03-08 (×4): 1300 mg via ORAL
  Filled 2024-03-07 (×4): qty 2

## 2024-03-07 MED ORDER — SODIUM CHLORIDE 0.9 % IV BOLUS
1500.0000 mL | Freq: Once | INTRAVENOUS | Status: AC
  Administered 2024-03-07: 1500 mL via INTRAVENOUS

## 2024-03-07 MED ORDER — LIDOCAINE HCL URETHRAL/MUCOSAL 2 % EX GEL
1.0000 | Freq: Once | CUTANEOUS | Status: AC
  Administered 2024-03-07: 1 via URETHRAL
  Filled 2024-03-07: qty 6

## 2024-03-07 MED ORDER — CARVEDILOL 3.125 MG PO TABS
3.1250 mg | ORAL_TABLET | Freq: Two times a day (BID) | ORAL | Status: DC
  Administered 2024-03-07 – 2024-03-14 (×14): 3.125 mg via ORAL
  Administered 2024-03-14: 1.56 mg via ORAL
  Administered 2024-03-15 – 2024-03-20 (×10): 3.125 mg via ORAL
  Filled 2024-03-07 (×26): qty 1

## 2024-03-07 MED ORDER — LACTATED RINGERS IV SOLN: INTRAVENOUS | Status: AC

## 2024-03-07 NOTE — Progress Notes (Signed)
   03/07/24 2131  BiPAP/CPAP/SIPAP  Reason BIPAP/CPAP not in use Other(comment) (pt refused)   RT asked patient if he wears CPAP at home and he stated No, I asked him if I brought him one would he wear it. He again stated No. NT at bedside for sitter said he may be confused. Will cont to monitor.

## 2024-03-07 NOTE — Evaluation (Signed)
 Physical Therapy Evaluation Patient Details Name: Calvin Escobar MRN: [id number] DOB: [birth year] Today's Date: 03/07/2024  History of Present Illness  73 y.o. male presents to [facility] from SNF with AMS and hypotension. Admitted with AKI w/ hyperkalemia, UTI, and acute encephalopathy.  PMHx: CKD stage 3a, hx of VRE, HTN, HLD, OSA on cpap, ACD, dementia, bipolar, anxiety and depression, chronic indwelling foley catheter, non ischemic CM with HFrEF, colovesical fistula/sigmoid diveritculitis, dementia   Clinical Impression  Pt was oriented to self during session and pleasantly confused (hx of dementia). Pt reports ambulating with assistance at SNF with use of RW. In today's session, pt required ModAx2 for bed mobility, to stand, and perform step-pivot transfer with RW. Pt had a posterior lean during transfer with loss of balance requiring assist to descend into recliner. Recommend return to <3hrs post acute rehab to decrease caregiver burden and prevent future falls. Acute PT to follow to address functional limitations listed below.         If plan is discharge home, recommend the following: A lot of help with walking and/or transfers;A lot of help with bathing/dressing/bathroom;Assistance with cooking/housework;Direct supervision/assist for medications management;Direct supervision/assist for financial management;Assist for transportation;Help with stairs or ramp for entrance   Can travel by private vehicle   No    Equipment Recommendations Other (comment) (defer to facility)     Functional Status Assessment Patient has had a recent decline in their functional status and demonstrates the ability to make significant improvements in function in a reasonable and predictable amount of time.     Precautions / Restrictions Precautions Precautions: Fall Recall of Precautions/Restrictions: Impaired Restrictions Weight Bearing Restrictions Per Provider Order: No      Mobility  Bed  Mobility Overal bed mobility: Needs Assistance Bed Mobility: Rolling, Sidelying to Sit Rolling: Mod assist, Used rails Sidelying to sit: Mod assist, +2 for physical assistance, +2 for safety/equipment, Used rails, HOB elevated    General bed mobility comments: cueing for technique, assist to complete roll and raise trunk    Transfers Overall transfer level: Needs assistance Equipment used: Rolling walker (2 wheels) Transfers: Sit to/from Stand, Bed to chair/wheelchair/BSC Sit to Stand: Mod assist, +2 physical assistance, +2 safety/equipment   Step pivot transfers: Mod assist, +2 physical assistance, +2 safety/equipment     General transfer comment: ModAx2 to boost-up and steady with posterior lean throughout. Able to take a few steps towards the recliner before losing balance with assist to control descent into chair    Ambulation/Gait    General Gait Details: unable this date    Balance Overall balance assessment: Needs assistance, Mild deficits observed, not formally tested Sitting-balance support: Bilateral upper extremity supported, Feet supported Sitting balance-Leahy Scale: Poor Sitting balance - Comments: reliant on UE support and CGA/MinA Postural control: Posterior lean Standing balance support: Bilateral upper extremity supported, During functional activity, Reliant on assistive device for balance Standing balance-Leahy Scale: Poor Standing balance comment: posterior lean with assist to correct balance        Pertinent Vitals/Pain Pain Assessment Pain Assessment: Faces Faces Pain Scale: Hurts little more Pain Location: IV site in R hand Pain Descriptors / Indicators: Discomfort, Burning Pain Intervention(s): Limited activity within patient's tolerance, Monitored during session, Repositioned    Home Living Family/patient expects to be discharged to:: Skilled nursing facility    Additional Comments: Per staff at [facility]    Prior Function Prior Level of  Function : Needs assist;Patient poor historian/Family not available    Mobility Comments: reports having  assist to ambulate with RW ADLs Comments: reports being independent to dress with staff close by for bathing     Extremity/Trunk Assessment   Upper Extremity Assessment Upper Extremity Assessment: Defer to OT evaluation    Lower Extremity Assessment Lower Extremity Assessment: Generalized weakness (R foot positioned in ER)       Communication   Communication Communication: No apparent difficulties    Cognition Arousal: Alert Behavior During Therapy: WFL for tasks assessed/performed   PT - Cognitive impairments: Orientation, History of cognitive impairments, Awareness, Memory, Attention, Sequencing, Problem solving, Safety/Judgement   Orientation impairments: Place, Time, Situation    PT - Cognition Comments: hx dementia, pleasantly confused and impulsive with mobility Following commands: Impaired Following commands impaired: Only follows one step commands consistently, Follows one step commands with increased time, Follows multi-step commands inconsistently     Cueing Cueing Techniques: Verbal cues, Tactile cues     General Comments General comments (skin integrity, edema, etc.): VSS on RA     PT Assessment Patient needs continued PT services  PT Problem List Decreased strength;Decreased activity tolerance;Decreased balance;Decreased mobility;Decreased cognition;Decreased knowledge of use of DME;Decreased safety awareness       PT Treatment Interventions DME instruction;Gait training;Functional mobility training;Therapeutic activities;Therapeutic exercise;Balance training;Neuromuscular re-education;Patient/family education    PT Goals (Current goals can be found in the Care Plan section)  Acute Rehab PT Goals Patient Stated Goal: to get better PT Goal Formulation: With patient Time For Goal Achievement: 03/21/24 Potential to Achieve Goals: Good    Frequency  Min 1X/week     Co-evaluation PT/OT/SLP Co-Evaluation/Treatment: Yes Reason for Co-Treatment: For patient/therapist safety;To address functional/ADL transfers;Necessary to address cognition/behavior during functional activity PT goals addressed during session: Mobility/safety with mobility;Balance;Proper use of DME         AM-PAC PT 6 Clicks Mobility  Outcome Measure Help needed turning from your back to your side while in a flat bed without using bedrails?: A Lot Help needed moving from lying on your back to sitting on the side of a flat bed without using bedrails?: Total Help needed moving to and from a bed to a chair (including a wheelchair)?: Total Help needed standing up from a chair using your arms (e.g., wheelchair or bedside chair)?: Total Help needed to walk in hospital room?: Total Help needed climbing 3-5 steps with a railing? : Total 6 Click Score: 7    End of Session Equipment Utilized During Treatment: Gait belt Activity Tolerance: Patient tolerated treatment well Patient left: in chair;with call bell/phone within reach;with chair alarm set Nurse Communication: Mobility status PT Visit Diagnosis: Unsteadiness on feet (R26.81);Other abnormalities of gait and mobility (R26.89);Muscle weakness (generalized) (M62.81)    Time: 8781-8764 PT Time Calculation (min) (ACUTE ONLY): 17 min   Charges:   PT Evaluation $PT Eval Low Complexity: 1 Low   PT General Charges $$ ACUTE PT VISIT: 1 Visit       Kate ORN, PT, DPT Secure Chat Preferred  Rehab Office [phone]  Kate BRAVO Wendolyn 03/07/2024, 12:51 PM

## 2024-03-07 NOTE — Care Plan (Signed)
 Called CT for STAT SCAN, patient has been marked ready on their end and transport has been ordered

## 2024-03-07 NOTE — Evaluation (Signed)
 Clinical/Bedside Swallow Evaluation Patient Details  Name: Calvin Escobar MRN: [id number] Date of Birth: [birth year]  Today's Date: 03/07/2024 Time: SLP Start Time (ACUTE ONLY): 1155 SLP Stop Time (ACUTE ONLY): 1217 SLP Time Calculation (min) (ACUTE ONLY): 22 min  Past Medical History:  Past Medical History:  Diagnosis Date   Acute cystitis 05/13/2023   Acute hyponatremia 05/13/2023   Acute kidney injury superimposed on chronic kidney disease 05/13/2023   CLINICAL CONTEXT: 73 year old male with stage 3 CKD, colovesical fistula, recurrent UTIs, and recent candidal UTI. Recent renal ultrasound (09/28/2023) confirmed mild left hydronephrosis and prostatic mass protruding into bladder base. Iron  studies consistent with anemia of chronic disease (iron  32, TIBC 238, ferritin 420).   DIAGNOSTIC ASSESSMENT: ? 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 History of GI bleeding (07/2023). Currently on ferrous sulfate  supplementation.   DIAGNOSTIC ASSESSMENT: ? Primary etiology (70-80% probability): Anemia of chronic disease/inflammation ? 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  EF 35 to 40% on echo 04/2021   Dementia (HCC)    Depression    Diarrhea 05/13/2023   E coli bacteremia 05/14/2023   High anion gap metabolic acidosis 07/13/2023   History of Clostridioides difficile colitis 08/15/2023   Associated with colovesical fistula? Recurrent antibiotic(s) requirement     Hyperkalemia    Associated with losartan   Advised patient low potassium diet 08/2022             Lab Results      Component    Value    Date/Time           K    4.0    12/27/2023 01:20 PM           K    4.4    12/18/2023 05:07 AM           K    4.4    12/17/2023 06:01 AM           K    4.7    12/16/2023 06:15 AM           K    4.9    12/15/2023 08:21 AM           K    4.4    12/14/2023 06:00 AM           K    5.0    Hyperlipidemia    Hypertension    Leukocytosis 12/11/2023   Lab Results      Component    Value    Date/Time           WBC    6.2    12/27/2023 01:20 PM           WBC  11.6 (H)    12/18/2023 05:07 AM           WBC    9.6    12/17/2023 06:01 AM           WBC    9.4    12/16/2023 06:15 AM           WBC    12.7 (H)    12/15/2023 08:21 AM           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  Work-up with cardiology in January 2021.     catheterization    Assessment:     1. Normal coronaries  2. NICM with EF 25-30%  3. Normal hemodynamics  .     Pneumonia    PUD (peptic ulcer disease) 01/12/2015   Formatting of this note might be different from the original.  Endoscopy esophageal stricture 2016     Septic shock from UTI 06/02/2023   Severe sepsis (HCC) 06/03/2023   SIRS (systemic inflammatory response syndrome) (HCC) 12/11/2023   SKIN CANCER, HX OF 05/10/2007   Facial left check and forehead and r shoulder  F/w derm   Syncope 05/13/2023   Thrombocytopenia 06/02/2023   Lab Results      Component    Value    Date           PLT    301    07/19/2023           PLT     324    07/18/2023           PLT    268    07/16/2023           PLT    207    07/14/2023           PLT    228    07/13/2023             Lab Results      Component    Value    Date           ESRSEDRATE    60 (H)    05/08/2023     No results found for: CRP          Lab Results      Component    Value       Urinary catheter complication 06/24/2023   Urinary dribbling 03/14/2022   Urinary incontinence 11/21/2022      Urinary Incontinence: They are wearing disposable diapers daily due to urinary incontinence, primarily nocturnal, and are currently on Tamsulosin  (Flomax ). We will refer them to Urology for further evaluation and potential treatment options and continue Tamsulosin  until they are seen by Urology.     Urinary tract infection without hematuria 06/02/2023   UTI (urinary tract infection) 07/01/2023   Past Surgical History:  Past Surgical History:  Procedure Laterality Date   COLECTOMY, SIGMOID, ROBOT-ASSISTED N/A 01/09/2024   Procedure: ROBOTIC LOW ANTERIOR RESECTION WITH ANASTOMOSIS, DRAINAGE OF PELVIC ABSCESS, COLOVESICAL FISTULA TAKEDOWN AND REPAIR, INTRAOPERATIVE ASSESSMENT OF TISSUE PERFUSION USING ICG DYE, AND BILATERAL TAP BLOCK;  Surgeon: Sheldon Standing, MD;  Location: [facility];  Service: General;  Laterality: N/A;  ROBOTIC RESECTION OF COLON RECTOSIGMOID   COLONOSCOPY     COLONOSCOPY N/A 12/05/2023   Procedure: COLONOSCOPY;  Surgeon: Leigh Standing SQUIBB, MD;  Location: [facility];  Service: Gastroenterology;  Laterality: N/A;   CYSTOSCOPY WITH INDOCYANINE GREEN  IMAGING (ICG) N/A 01/09/2024   Procedure: CYSTOSCOPY WITH INDOCYANINE GREEN  IMAGING (ICG);  Surgeon: Watt Rush, MD;  Location: [facility];  Service: Urology;  Laterality: N/A;  CYSTO WITH FIREFLY INJECTION   FLEXIBLE SIGMOIDOSCOPY N/A 01/09/2024   Procedure: SIGMOIDOSCOPY, FLEXIBLE;  Surgeon: Sheldon Standing, MD;  Location: [facility];  Service: General;  Laterality: N/A;   LAPAROSCOPIC LOOP COLOSTOMY N/A 01/09/2024   Procedure: CREATION OF  DIVERTING LOOP ILEOSTOMY;  Surgeon: Sheldon Standing, MD;  Location: [facility];  Service: General;  Laterality: N/A;   RIGHT/LEFT HEART CATH AND CORONARY ANGIOGRAPHY N/A 07/03/2019   Procedure: RIGHT/LEFT HEART CATH AND CORONARY ANGIOGRAPHY;  Surgeon: Cherrie Toribio SAUNDERS, MD;  Location: [facility];  Service: Cardiovascular;  Laterality: N/A;   ROBOTIC ASSISTED LAPAROSCOPIC LYSIS OF ADHESION  01/09/2024   Procedure: LYSIS, ADHESIONS, ROBOT-ASSISTED, LAPAROSCOPIC;  Surgeon: Sheldon Standing, MD;  Location: [facility];  Service: General;;   HPI:  Pt is a 73 y.o. male who presented from SNF for AMS x 2 days.  Found to be in AKI with metabolic encephalopathy and admitted. CXR (03/07/24) revealed, Subtle nodular density in the left mid lung may reflect a confluence of shadows. Pneumonia or underlying soft tissue lesion not excluded. MD ordered BSE  in order for speech therapy to evaluate to rule out ongoing aspiration. Pt has had prior UGI study in 04/2021 that showed  Mild esophageal dysmotility.  2. Tiny hiatal herni and a fair amount of residual contrast in the oropharynx.  From review of flouroscopy loops from UGI study, pt with appearance of secretions retained in pharynx that mixed with barium. BSE (06/06/23) noted, 3 ounce Yale water  challenge conducted at end of testing - resulting in pt overtly coughing - copiously and having mildly increased work of breathing... Suspect his primary risk is his known dysmotility exacerbated by his dyspnea.  Recommend continue dys2/thin for now . PMH: CKD stage 3a, hx of VRE, HTN, HLD, OSA on cpap, ACD, dementia, bipolar, anxiety and depression, chronic indwelling foley catheter, non ischemic CM with HFrEF, hospitalization for colovesical fistula/sigmoid diveritculitis for which he underwent robotic rectosigmoid resection with anastomosis, colovesical fistula takedown and repair, diverting ileostomy, drainage of pelvic abscess and lysis of adhesions in 01/2024.    Assessment /  Plan / Recommendation  Clinical Impression  Pt presents with functional oropharyngeal swallow at bedside this afternoon. No s/sx of aspiration with POs. Pt alert, pleasantly confused, very eager to consume POs. No obvious deficits noted during oral motor exam. Lower dentition is present, though few are missing. Maxillary dentition is absent and no denture to be found (case sitting on sink countertop empty). In the upright position, pt self-fed thin liquids by straw in single and consecutive sips without clinical signs of aspiration to follow across multiple attempts. Encouraged 3oz swallow challenge, however pt not able to follow commands for accurate completion, likely due to baseline cognitive deficits. Puree and regular textures were actively manipulated and cleared from oral cavity. Less efficiency and timeliness of oral prep/clearance noted with regular textured cracker. Given swallow presentation this date, which is likely impacted by cognitive function as well as dentition, recommend dys 2 diet/thin liquids. Educated pt regarding swallow precautions to ensure safety with POs given h/o dysphagia and esophageal related issues (see HPI). Will f/u for tolerance. Discussed recommendations with RN and that if pt has difficulty with new diet, to contact SLP team.  SLP Visit Diagnosis: Dysphagia, unspecified (R13.10)  Aspiration Risk  Mild aspiration risk    Diet Recommendation Dysphagia 2 (Fine chop);Thin liquid    Liquid Administration via: Cup;Straw Medication Administration: Whole meds with liquid (or in puree as tolerated) Supervision: Patient able to self feed;Intermittent supervision to cue for compensatory strategies Compensations: Minimize environmental distractions;Slow rate;Small sips/bites Postural Changes: Seated upright at 90 degrees;Remain upright for at least 30 minutes after po intake    Other  Recommendations Oral Care Recommendations: Oral care BID     Assistance Recommended  at Discharge    Functional Status Assessment Patient has had a recent decline in their functional status and demonstrates the ability to make significant improvements in function in a reasonable and predictable amount of time.  Frequency and Duration min 2x/week  2 weeks       Prognosis Prognosis for improved oropharyngeal function: Good Barriers to Reach Goals: Cognitive deficits;Time post onset      Swallow Study   General Date of Onset: 03/07/24 HPI: Pt is a 73 y.o. male who presented from SNF for AMS x 2 days.  Found to be in AKI with metabolic encephalopathy and admitted. CXR (03/07/24) revealed, Subtle nodular density in the left mid lung may reflect a confluence of shadows. Pneumonia or underlying soft tissue lesion not excluded. MD ordered BSE  in order for speech therapy to evaluate to rule out ongoing aspiration. Pt has had prior UGI study in 04/2021 that showed  Mild esophageal dysmotility.  2. Tiny hiatal herni and a fair amount of residual contrast in the oropharynx.  From review of flouroscopy loops from UGI study, pt with appearance of secretions retained in pharynx that mixed with barium. BSE (06/06/23) noted, 3 ounce Yale water  challenge conducted at end of testing - resulting in pt overtly coughing - copiously and having mildly increased work of breathing... Suspect his primary risk is his known dysmotility exacerbated by his dyspnea.  Recommend continue dys2/thin for now . PMH: CKD stage 3a, hx of VRE, HTN, HLD, OSA on cpap, ACD, dementia, bipolar, anxiety and depression, chronic indwelling foley catheter, non ischemic CM with HFrEF, hospitalization for colovesical fistula/sigmoid diveritculitis for which he underwent robotic rectosigmoid resection with anastomosis, colovesical fistula takedown and repair, diverting ileostomy, drainage of pelvic abscess and lysis of adhesions in 01/2024. Type of Study: Bedside Swallow Evaluation Previous Swallow Assessment: see HPI Diet Prior to  this Study: Regular;Thin liquids (Level 0) Temperature Spikes Noted: No Respiratory Status: Room air History of Recent Intubation: No Behavior/Cognition: Alert;Cooperative;Pleasant mood;Confused Oral Cavity Assessment: Within Functional Limits Oral Care Completed by SLP: No Oral Cavity - Dentition: Dentures, not available;Edentulous;Missing dentition;Poor condition Vision: Functional for self-feeding Self-Feeding Abilities: Able to feed self Patient Positioning: Upright in bed;Postural control adequate for testing Baseline Vocal Quality: Normal Volitional Cough: Strong Volitional Swallow: Able to elicit    Oral/Motor/Sensory Function Overall Oral Motor/Sensory Function: Within functional limits   Ice Chips Ice chips: Not tested   Thin Liquid Thin Liquid: Within functional limits Presentation: Self Fed;Straw    Nectar Thick Nectar Thick Liquid: Not tested   Honey Thick Honey Thick Liquid: Not tested   Puree Puree: Within functional limits Presentation: Self Fed;Spoon   Solid     Solid: Impaired Presentation: Self Fed Oral Phase Impairments: Impaired mastication Oral Phase Functional Implications: Impaired mastication;Prolonged oral transit       Wilder Kin, MA, CCC-SLP Acute Rehabilitation Services Office Number: [phone]  Wilder KANDICE Kin 03/07/2024,12:33 PM

## 2024-03-07 NOTE — Care Plan (Signed)
 Foley irrigated with 100 ml. Post irrigation bladder scan: 0 ml

## 2024-03-07 NOTE — Evaluation (Signed)
 Occupational Therapy Evaluation Patient Details Name: Calvin Escobar MRN: [id number] DOB: 07-08-1950 Today's Date: 03/07/2024   History of Present Illness   73 y.o. male presents to [facility] from SNF with AMS and hypotension. Admitted with AKI w/ hyperkalemia, UTI, and acute encephalopathy.  PMHx: CKD stage 3a, hx of VRE, HTN, HLD, OSA on cpap, ACD, dementia, bipolar, anxiety and depression, chronic indwelling foley catheter, non ischemic CM with HFrEF, colovesical fistula/sigmoid diveritculitis, dementia     Clinical Impressions Pt reports having assist at baseline with ADLs from SNF staff, uses RW for mobility. Pt currently needs min-max A for ADLs, mod +2 for bed mobility and mod +2 for transfers with RW, pt with heavy posterior lean in standing. Pt presenting with impairments listed below, will follow acutely. Patient will benefit from continued inpatient follow up therapy, <3 hours/day.      If plan is discharge home, recommend the following:   Two people to help with walking and/or transfers;A lot of help with bathing/dressing/bathroom;Assistance with cooking/housework;Direct supervision/assist for medications management;Direct supervision/assist for financial management;Assist for transportation     Functional Status Assessment   Patient has had a recent decline in their functional status and demonstrates the ability to make significant improvements in function in a reasonable and predictable amount of time.     Equipment Recommendations   Other (comment) (defer)     Recommendations for Other Services   PT consult     Precautions/Restrictions   Precautions Precautions: Fall Recall of Precautions/Restrictions: Impaired Restrictions Weight Bearing Restrictions Per Provider Order: No     Mobility Bed Mobility Overal bed mobility: Needs Assistance Bed Mobility: Rolling, Sidelying to Sit Rolling: Mod assist, Used rails Sidelying to sit: Mod assist, +2 for  physical assistance, +2 for safety/equipment, Used rails, HOB elevated       General bed mobility comments: cueing for technique, assist to complete roll and raise trunk    Transfers Overall transfer level: Needs assistance Equipment used: Rolling walker (2 wheels) Transfers: Sit to/from Stand, Bed to chair/wheelchair/BSC Sit to Stand: Mod assist, +2 physical assistance, +2 safety/equipment     Step pivot transfers: Mod assist, +2 physical assistance, +2 safety/equipment            Balance Overall balance assessment: Needs assistance, Mild deficits observed, not formally tested Sitting-balance support: Bilateral upper extremity supported, Feet supported Sitting balance-Leahy Scale: Poor Sitting balance - Comments: reliant on UE support and CGA/MinA Postural control: Posterior lean Standing balance support: Bilateral upper extremity supported, During functional activity, Reliant on assistive device for balance Standing balance-Leahy Scale: Poor Standing balance comment: posterior lean with assist to correct balance                           ADL either performed or assessed with clinical judgement   ADL Overall ADL's : Needs assistance/impaired Eating/Feeding: Set up;Sitting   Grooming: Set up;Sitting   Upper Body Bathing: Minimal assistance   Lower Body Bathing: Moderate assistance   Upper Body Dressing : Moderate assistance   Lower Body Dressing: Moderate assistance   Toilet Transfer: Moderate assistance;+2 for physical assistance   Toileting- Clothing Manipulation and Hygiene: Maximal assistance       Functional mobility during ADLs: Moderate assistance;+2 for physical assistance;Rolling walker (2 wheels)       Vision   Additional Comments: not formally assessed     Perception         Praxis  Pertinent Vitals/Pain Pain Assessment Pain Assessment: Faces Pain Score: 4  Faces Pain Scale: Hurts little more Pain Location: IV site  in R hand Pain Descriptors / Indicators: Discomfort, Burning Pain Intervention(s): Limited activity within patient's tolerance, Monitored during session, Repositioned     Extremity/Trunk Assessment Upper Extremity Assessment Upper Extremity Assessment: Generalized weakness (limited L shoulder ROM)   Lower Extremity Assessment Lower Extremity Assessment: Defer to PT evaluation   Cervical / Trunk Assessment Cervical / Trunk Assessment: Kyphotic   Communication Communication Communication: No apparent difficulties   Cognition Arousal: Alert Behavior During Therapy: WFL for tasks assessed/performed               OT - Cognition Comments: hx dementia                 Following commands: Impaired Following commands impaired: Only follows one step commands consistently, Follows one step commands with increased time, Follows multi-step commands inconsistently     Cueing  General Comments   Cueing Techniques: Verbal cues;Tactile cues  VSS on RA   Exercises     Shoulder Instructions      Home Living Family/patient expects to be discharged to:: Skilled nursing facility                                 Additional Comments: Per staff at [facility]      Prior Functioning/Environment Prior Level of Function : Needs assist;Patient poor historian/Family not available             Mobility Comments: reports having assist to ambulate with RW ADLs Comments: reports being independent to dress with staff close by for bathing    OT Problem List: Decreased strength;Decreased range of motion;Decreased activity tolerance;Impaired balance (sitting and/or standing);Decreased cognition;Decreased safety awareness   OT Treatment/Interventions: Self-care/ADL training;Therapeutic exercise;Energy conservation;DME and/or AE instruction;Therapeutic activities;Patient/family education;Balance training      OT Goals(Current goals can be found in the care plan section)    Acute Rehab OT Goals Patient Stated Goal: did not state OT Goal Formulation: With patient Time For Goal Achievement: 03/21/24 Potential to Achieve Goals: Good ADL Goals Pt Will Transfer to Toilet: ambulating;regular height toilet;with min assist Pt/caregiver will Perform Home Exercise Program: Both right and left upper extremity;With Supervision Additional ADL Goal #1: pt will perform bed mobility supervision in prep for ADLs Additional ADL Goal #2: pt will tolerate OOB standing activity x5 min in order to improve activity tolerance for ADLs   OT Frequency:  Min 2X/week    Co-evaluation PT/OT/SLP Co-Evaluation/Treatment: Yes Reason for Co-Treatment: For patient/therapist safety;To address functional/ADL transfers;Necessary to address cognition/behavior during functional activity PT goals addressed during session: Mobility/safety with mobility;Balance;Proper use of DME OT goals addressed during session: ADL's and self-care      AM-PAC OT 6 Clicks Daily Activity     Outcome Measure Help from another person eating meals?: A Little Help from another person taking care of personal grooming?: A Little Help from another person toileting, which includes using toliet, bedpan, or urinal?: A Lot Help from another person bathing (including washing, rinsing, drying)?: A Lot Help from another person to put on and taking off regular upper body clothing?: A Lot Help from another person to put on and taking off regular lower body clothing?: A Lot 6 Click Score: 14   End of Session Equipment Utilized During Treatment: Gait belt;Rolling walker (2 wheels) Nurse Communication: Mobility status  Activity Tolerance: Patient  tolerated treatment well Patient left: in chair;with call bell/phone within reach;with chair alarm set  OT Visit Diagnosis: Unsteadiness on feet (R26.81);Other abnormalities of gait and mobility (R26.89);Muscle weakness (generalized) (M62.81)                Time:  8781-8763 OT Time Calculation (min): 18 min Charges:  OT General Charges $OT Visit: 1 Visit OT Evaluation $OT Eval Moderate Complexity: 1 Mod  Calvin Escobar, OTD, OTR/L SecureChat Preferred Acute Rehab [phone]   Calvin Escobar 03/07/2024, 4:14 PM

## 2024-03-07 NOTE — Progress Notes (Addendum)
 PROGRESS NOTE                                                                                                                                                                                                             Patient Demographics:    Calvin Escobar, is a 73 y.o. male, DOB - 09/25/1950, FMW:[id number]  Outpatient Primary MD for the patient is [name], MD    LOS - 1  Admit date - 03/06/2024    Chief Complaint  Patient presents with   Hypotension   Altered Mental Status       Brief Narrative (HPI from H&P)   73 y.o. 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 He was re admitted at beginning of September this year for sepsis secondary to UTI/gram negative bacteremia and AKI on CKD stage 3a. He presents today from his SNF for AMS x 2 days.  Found again to be in AKI with metabolic encephalopathy and admitted.   Subjective:    Norleen Brier today has, No headache, No chest pain, No abdominal pain - No Nausea, No new weakness tingling or numbness, no shortness of breath   Assessment  & Plan :    Acute kidney injury superimposed on stage 3a chronic kidney disease (HCC) with hyperkalemia, history of colovesical fistula with recent repair, diverting ileostomy, indwelling Foley.  Multiple admissions for UTI.  73 year old male presenting to ED from SNF with concerns for AMS x 2 days and hypotension found to have acute on chronic kidney injury with hyperkalemia, nephrology following, Lokelma  for hyperkalemia, hydrate,  renal ultrasound noted with chronic bilateral hydronephrosis largely unchanged.  Renal function stabilized, currently not uremic, monitor with IV fluids and Lokelma , check CT abdomen pelvis as well.  Acute encephalopathy  -hx of dementia and unsure of baseline, due to AKI, possible UTI and multiple sedating medications at SNF, hold offending medications, supportive care for AKI and hyperkalemia as above, empiric antibiotics and monitor.  Recurrent UTIs and possible pneumonia.  Empiric antibiotics,  CT abdomen pelvis pending, follow cultures.  Speech therapy to evaluate also to rule out ongoing aspiration.  Change Foley catheter on 03/07/2024, he had indwelling Foley catheter upon admission could be the source of UTI, flush Foley every shift.  Anemia of chronic disease Baseline hgb 7-8. No signs of bleeding  Iron  studies showed low iron  in 02/2024 and given IV iron  on 9/5.   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 Mild to moderate MV regurgitation. Diastolic indeterminate   Chronic indwelling Foley catheter Foley appears to be working, 100cc of urine on bladder scan Foley care   Bipolar affective disorder, depressed (HCC) Working on getting med rec reconciled  On d/c a few weeks ago on mirtazapine , buspirone , abilify .  Hold gabapentin  with aKI   Dementia (HCC) Delirium precautions   GERD (gastroesophageal reflux disease) Continue PPI   HLD (hyperlipidemia) Continue statin   OSA per chart on BiPAP [facility] BiPAP  Chronic hyponatremia At baseline,continue to trend           Condition - Extremely Guarded  Family Communication  : Called wife [phone] Calvin Escobar on 03/07/2024 at 8:35 AM, message left  Code Status : Full code  Consults  : Nephrology  PUD Prophylaxis : PPI   Procedures  :     CT abdomen pelvis.      Disposition Plan  :    Status is: Inpatient  DVT Prophylaxis  :    heparin  injection 5,000 Units Start: 03/06/24 2200    Lab Results  Component Value Date   PLT 207 03/07/2024    Diet :  Diet Order             Diet renal with fluid restriction Fluid restriction: 1500 mL Fluid; Room service appropriate? Yes; Fluid consistency: Thin  Diet effective now                    Inpatient Medications  Scheduled Meds:  azithromycin  500 mg Oral Daily   busPIRone   10 mg Oral TID   carvedilol   3.125 mg Oral BID WC   finasteride   5 mg Oral Daily   folic acid   1 mg Oral Daily   heparin   5,000 Units Subcutaneous Q8H   pantoprazole   40 mg Oral Daily   sodium bicarbonate   1,300 mg Oral BID   sodium zirconium cyclosilicate   10 g Oral TID   tamsulosin   0.4 mg Oral Daily   Continuous Infusions:  cefTRIAXone  (ROCEPHIN )  IV     lactated ringers      magnesium  sulfate bolus IVPB     PRN Meds:.acetaminophen  **OR** acetaminophen , metoprolol  tartrate, ondansetron  **OR** ondansetron  (ZOFRAN ) IV  Antibiotics  :    Anti-infectives (From admission, onward)    Start     Dose/Rate Route Frequency Ordered Stop   03/07/24 1800  cefTRIAXone  (ROCEPHIN ) 1 g in sodium chloride  0.9 % 100 mL IVPB        1 g 200 mL/hr over 30 Minutes Intravenous Every 24 hours 03/07/24 0842 03/12/24 1759   03/07/24 0930  azithromycin (ZITHROMAX) tablet 500 mg  500 mg Oral Daily 03/07/24 0842 03/12/24 0959   03/06/24 1800  cefTRIAXone  (ROCEPHIN ) 1 g in sodium chloride  0.9 % 100 mL IVPB  Status:  Discontinued        1 g 200 mL/hr over 30 Minutes Intravenous Every 24 hours 03/06/24 1703 03/07/24 0842         Objective:   Vitals:   03/07/24  0330 03/07/24 0400 03/07/24 0748 03/07/24 0800  BP: (!) 141/65 (!) 120/99 (!) 128/93 119/61  Pulse: 93 99 (!) 118 (!) 103  Resp: 16 (!) 21 19 20   Temp: 97.6 F (36.4 C)  97.8 F (36.6 C) 97.8 F (36.6 C)  TempSrc: Oral  Oral Oral  SpO2: 100% 100% 100% 99%  Weight:      Height:        Wt Readings from Last 3 Encounters:  03/06/24 98.2 kg  02/09/24 100.3 kg  01/12/24 102.7 kg     Intake/Output Summary (Last 24 hours) at 03/07/2024 0845 Last data filed at 03/07/2024 0600 Gross per 24 hour  Intake 1650.57 ml  Output 3050 ml  Net -1399.43 ml     Physical Exam  Awake, minimally confused, oriented x 2, no new F.N deficits,   [place].AT,PERRAL Supple Neck, No JVD,   Symmetrical Chest wall movement, Good air movement bilaterally, CTAB RRR,No Gallops,Rubs or new Murmurs,  +ve B.Sounds, Abd Soft, No tenderness, Foley catheter in place, ileostomy in place No Cyanosis, Clubbing or edema       Data Review:    Recent Labs  Lab 03/06/24 1407 03/06/24 1413 03/07/24 0413  WBC 6.8  --  6.7  HGB 7.7* 8.2* 8.0*  HCT 26.2* 24.0* 25.7*  PLT 192  --  207  MCV 88.5  --  86.0  MCH 26.0  --  26.8  MCHC 29.4*  --  31.1  RDW 15.3  --  15.4  LYMPHSABS 0.7  --   --   MONOABS 0.5  --   --   EOSABS 0.1  --   --   BASOSABS 0.1  --   --     Recent Labs  Lab 03/06/24 1407 03/06/24 1413 03/06/24 1449 03/06/24 1454 03/06/24 1701 03/07/24 0413 03/07/24 0551  NA 131* 129*  --   --   --  133*  --   K 5.7* 5.5*  --   --   --  5.3*  --   CL 100 101  --   --   --  104  --   CO2 18*  --   --   --   --  16*  --   ANIONGAP 13  --   --   --   --  13  --   GLUCOSE 96 100*  --   --   --  89  --   BUN 71* 72*  --   --   --  68*  --   CREATININE 5.49* 6.30*  --   --   --  5.52*  --   AST 17  --   --   --   --  11*  --   ALT 8  --   --   --   --  9  --   ALKPHOS 87  --   --   --   --  66  --   BILITOT 0.2  --   --   --   --  0.6  --   ALBUMIN  3.0*  --   --   --   --  2.2*  --   CRP  --   --    --   --   --   --  21.9*  PROCALCITON  --   --   --   --   --  3.08  --   LATICACIDVEN  --   --   --  0.8  --   --   --   TSH  --   --   --   --  1.230  --   --   AMMONIA  --   --  15  --   --   --  20  BNP  --   --   --   --   --   --  573.1*  MG  --   --   --   --   --   --  1.6*  PHOS  --   --   --   --   --   --  5.2*  CALCIUM  8.8*  --   --   --   --  8.2*  --       Recent Labs  Lab 03/06/24 1407 03/06/24 1449 03/06/24 1454 03/06/24 1701 03/07/24 0413 03/07/24 0551  CRP  --   --   --   --   --  21.9*  PROCALCITON  --   --   --   --  3.08  --   LATICACIDVEN  --   --  0.8  --   --   --   TSH  --   --   --  1.230  --   --   AMMONIA  --  15  --   --   --  20  BNP  --   --   --   --   --  573.1*  MG  --   --   --   --   --  1.6*  CALCIUM  8.8*  --   --   --  8.2*  --     --------------------------------------------------------------------------------------------------------------- Lab Results  Component Value Date   CHOL 118 08/14/2023   HDL 39 (L) 08/14/2023   LDLCALC 54 08/14/2023   TRIG 168 (H) 08/14/2023   CHOLHDL 3.0 08/14/2023    Lab Results  Component Value Date   HGBA1C 5.0 01/01/2024   Recent Labs    03/06/24 1701  TSH 1.230   Recent Labs    03/06/24 1702  VITAMINB12 365   ------------------------------------------------------------------------------------------------------------------ Cardiac Enzymes No results for input(s): CKMB, TROPONINI, MYOGLOBIN in the last 168 hours.  Invalid input(s): CK  Micro Results Recent Results (from the past 240 hours)  Culture, blood (Routine X 2) w Reflex to ID Panel     Status: None (Preliminary result)   Collection Time: 03/06/24  1:26 PM   Specimen: Right Antecubital; Blood  Result Value Ref Range Status   Specimen Description   Final    RIGHT ANTECUBITAL Performed at [facility], [address] [place], [place] [address]    Special Requests   Final    BOTTLES DRAWN AEROBIC AND  ANAEROBIC Blood Culture adequate volume Performed at [facility], [address], [place], [place] [address]    Culture   Final    NO GROWTH < 24 HOURS Performed at [facility], [address] [place], [place] [address]    Report Status PENDING  Incomplete  Culture, blood (Routine X 2) w Reflex to  ID Panel     Status: None (Preliminary result)   Collection Time: 03/06/24  1:31 PM   Specimen: BLOOD LEFT FOREARM  Result Value Ref Range Status   Specimen Description   Final    BLOOD LEFT FOREARM Performed at [facility], [address] [place], [place] [address]    Special Requests   Final    BOTTLES DRAWN AEROBIC AND ANAEROBIC Blood Culture adequate volume Performed at [facility] [address] [place], [place] [address]    Culture   Final    NO GROWTH < 24 HOURS Performed at [facility], [address], [place], [place] [address]    Report Status PENDING  Incomplete    Radiology Report DG Chest Port 1 View Result Date: 03/07/2024 CLINICAL DATA:  Shortness of breath. EXAM: PORTABLE CHEST 1 VIEW COMPARISON:  03/06/2024 FINDINGS: Low volume film. Cardiopericardial silhouette is at upper limits of normal for size. Interstitial markings are diffusely coarsened with chronic features. Small nodular density in the peripheral right mid lung is stable. Subtle nodular density in the left mid lung may reflect a confluence of shadows. No dense focal airspace consolidation. No substantial pleural effusion. Skin folds are seen over the upper lungs bilaterally. Telemetry leads overlie the chest. IMPRESSION: 1. Low volume film with chronic interstitial coarsening. 2. Subtle nodular density in the left mid lung may reflect a confluence of shadows. Pneumonia or underlying soft tissue lesion not excluded. Attention on follow-up recommended. 3. Calcified granuloma again noted right lung. Electronically Signed   By: Camellia Candle M.D.   On:  03/07/2024 07:40   US  RENAL Result Date: 03/06/2024 CLINICAL DATA:  [id number] AKI (acute kidney injury) [id number] EXAM: RENAL / URINARY TRACT ULTRASOUND COMPLETE COMPARISON:  02/05/2024, 09/28/2023 FINDINGS: Right Kidney: Renal measurements: 12.1 x 6.1 x 8.2 cm = volume: 313 mL.Normal echogenicity. No mass. Redemonstrated moderate to severe hydroureteronephrosis. No nephrolithiasis. Left Kidney: Renal measurements: 11.8 x 6.5 x 5.8 cm = volume: 234 mL. Normal echogenicity. No mass. Mild hydronephrosis. No nephrolithiasis. Bladder: Similar trabeculation of the urinary bladder wall. Other: None. IMPRESSION: 1. Similarly appearing bilateral hydronephrosis, moderate to severe on the right. 2. Trabeculation of the urinary bladder wall, which may be due to chronic bladder outlet obstruction. Electronically Signed   By: Rogelia Myers M.D.   On: 03/06/2024 17:14   DG Chest Port 1 View Result Date: 03/06/2024 CLINICAL DATA:  Shortness of breath, altered mental status and weakness. EXAM: PORTABLE CHEST 1 VIEW COMPARISON:  [id number] FINDINGS: The heart size and mediastinal contours are within normal limits. Very low bilateral lung volumes with bibasilar atelectasis. There is no evidence of pulmonary edema, consolidation, pneumothorax or pleural fluid. Stable calcified granuloma in the right lung. The visualized skeletal structures are unremarkable. IMPRESSION: Very low bilateral lung volumes with bibasilar atelectasis. Electronically Signed   By: Marcey Moan M.D.   On: 03/06/2024 14:51     Signature  -   Lavada Stank M.D on 03/07/2024 at 8:45 AM   -  To page go to [email or web address]

## 2024-03-07 NOTE — Plan of Care (Signed)
   Problem: Education: Goal: Knowledge of General Education information will improve Description: Including pain rating scale, medication(s)/side effects and non-pharmacologic comfort measures Outcome: Not Progressing   Problem: Health Behavior/Discharge Planning: Goal: Ability to manage health-related needs will improve Outcome: Not Progressing

## 2024-03-07 NOTE — Progress Notes (Signed)
 [facility] Progress Note  Subjective:  Pt seen in room, no c/o's today   Vitals:   03/07/24 0330 03/07/24 0400 03/07/24 0748 03/07/24 0800  BP: (!) 141/65 (!) 120/99 (!) 128/93 119/61  Pulse: 93 99 (!) 118 (!) 103  Resp: 16 (!) 21 19 20   Temp: 97.6 F (36.4 C)  97.8 F (36.6 C) 97.8 F (36.6 C)  TempSrc: Oral  Oral Oral  SpO2: 100% 100% 100% 99%  Weight:      Height:        Exam: General: elderly WM, alert, no distress Neck: flat neck veind HEENT: dry mouth  CV: regular rate, no RG Lungs: BLBS, normal work of breathing Abd: soft, non-tender, non-distended; +ostomy bag Neuro: normal speech, doesn't know hospital/ town or year +Foley   Home bp meds:  Coreg  6.25 bid Sodium bicarb 1300mg  bid Others: abilify , buspar , neurontin , norco, imodium , PPI, statin, primidone , flomax , effexor      Date   Creat  eGFR (ml/min) 2015- aug 2024 0.80- 1.24 May 2023  2.04 >> 1.13  AKI Jan 2025  1.52 >> 1.20   AKI  Feb 2025  1.44 >> 0.96   AKI Mar- April 2025 1.16- 1.29 May- June 2025 1.52- 1.89 July 2025  1.41- 2.75  Aug 2025  1.27- 1.66 43- 59 ml/min Sept 2025  3.16 >> 1.49   20 - 49 ml/min 10/03   5.49, 6.30 10  10/04   5.52  10  Renal US : 12.1/ .11.8 cm kidneys w/ mod-severe hydro on R and mild hydro on L UA 10/3 - mod Hb, mod LE, 100 prot, many bact, rbc/ wbc > 50, epis 0-5 UNa 38, UCr 79    Assessment/ Plan: AKI on CKD 3a: b/l creatinine 1.3- 1.7 from aug 2025, eGFR 43- 59 ml/min. Creat here 5.4 on admission in the setting of AMS and gen'd weakness. Pt has chronic foley and colostomy bag, hx of UTIs. Hypotensive on arrival BP 96/70.  Possible recurrence of prior high-output ostomy causing vol depletion. FeNa 2% indeterminate, likely not accurate given chronic foley.  Renal US  showed similar appearing bilateral hydronephrosis, moderate to severe on the right & urinary bladder wall trabeculations likely from chronic bladder outlet obstruction. From imaging,  longstanding hydronephrosis sounds stable. Suspect AKI from vol depletion +/- other. Will bolus 1.5 L NS over 6 hrs and then resume bicarb IVFs at 75 cc/hr. Will follow.  Hyperkalemia: likely 2/2 AKI. Cont lokelma  tid and renal diet til corrected Metabolic acidosis: likely from AKI.  Getting IV/ po sod bicarbonate.  Volume: looks dry on exam. 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 Inpatient medications:  azithromycin  500 mg Oral Daily   busPIRone   10  mg Oral TID   carvedilol   3.125 mg Oral BID WC   finasteride   5 mg Oral Daily   folic acid   1 mg Oral Daily   heparin   5,000 Units Subcutaneous Q8H   pantoprazole   40 mg Oral Daily   sodium bicarbonate   1,300 mg Oral BID   sodium zirconium cyclosilicate   10 g Oral TID   tamsulosin   0.4 mg Oral Daily    cefTRIAXone  (ROCEPHIN )  IV     lactated ringers      magnesium  sulfate bolus IVPB     acetaminophen  **OR** acetaminophen , metoprolol  tartrate, ondansetron  **OR** ondansetron  (ZOFRAN ) IV

## 2024-03-08 ENCOUNTER — Encounter (HOSPITAL_COMMUNITY): Payer: Self-pay | Admitting: Family Medicine

## 2024-03-08 ENCOUNTER — Other Ambulatory Visit: Payer: Self-pay

## 2024-03-08 DIAGNOSIS — N1831 Chronic kidney disease, stage 3a: Secondary | ICD-10-CM | POA: Diagnosis not present

## 2024-03-08 DIAGNOSIS — N179 Acute kidney failure, unspecified: Secondary | ICD-10-CM | POA: Diagnosis not present

## 2024-03-08 LAB — COMPREHENSIVE METABOLIC PANEL WITH GFR
ALT: 8 U/L (ref 0–44)
AST: <10 U/L — ABNORMAL LOW (ref 15–41)
Albumin: 1.9 g/dL — ABNORMAL LOW (ref 3.5–5.0)
Alkaline Phosphatase: 53 U/L (ref 38–126)
Anion gap: 14 (ref 5–15)
BUN: 60 mg/dL — ABNORMAL HIGH (ref 8–23)
CO2: 19 mmol/L — ABNORMAL LOW (ref 22–32)
Calcium: 7.9 mg/dL — ABNORMAL LOW (ref 8.9–10.3)
Chloride: 103 mmol/L (ref 98–111)
Creatinine, Ser: 4.27 mg/dL — ABNORMAL HIGH (ref 0.61–1.24)
GFR, Estimated: 14 mL/min — ABNORMAL LOW (ref >60)
Glucose, Bld: 112 mg/dL — ABNORMAL HIGH (ref 70–99)
Potassium: 4.5 mmol/L (ref 3.5–5.1)
Sodium: 136 mmol/L (ref 135–145)
Total Bilirubin: 0.5 mg/dL (ref 0.0–1.2)
Total Protein: 5.4 g/dL — ABNORMAL LOW (ref 6.5–8.1)

## 2024-03-08 LAB — BLOOD CULTURE ID PANEL (REFLEXED) - BCID2

## 2024-03-08 LAB — URINE CULTURE: Culture: >=100000 — AB

## 2024-03-08 LAB — MAGNESIUM: Magnesium: 2.2 mg/dL (ref 1.7–2.4)

## 2024-03-08 LAB — CBC WITH DIFFERENTIAL/PLATELET
Abs Immature Granulocytes: 0.12 K/uL — ABNORMAL HIGH (ref 0.00–0.07)
Basophils Absolute: 0.1 K/uL (ref 0.0–0.1)
Basophils Relative: 2 %
Eosinophils Absolute: 0.1 K/uL (ref 0.0–0.5)
Eosinophils Relative: 2 %
HCT: 24.7 % — ABNORMAL LOW (ref 39.0–52.0)
Hemoglobin: 7.6 g/dL — ABNORMAL LOW (ref 13.0–17.0)
Immature Granulocytes: 2 %
Lymphocytes Relative: 17 %
Lymphs Abs: 1.2 K/uL (ref 0.7–4.0)
MCH: 26.1 pg (ref 26.0–34.0)
MCHC: 30.8 g/dL (ref 30.0–36.0)
MCV: 84.9 fL (ref 80.0–100.0)
Monocytes Absolute: 0.5 K/uL (ref 0.1–1.0)
Monocytes Relative: 7 %
Neutro Abs: 4.7 K/uL (ref 1.7–7.7)
Neutrophils Relative %: 70 %
Platelets: 231 K/uL (ref 150–400)
RBC: 2.91 MIL/uL — ABNORMAL LOW (ref 4.22–5.81)
RDW: 15.3 % (ref 11.5–15.5)
WBC: 6.7 K/uL (ref 4.0–10.5)
nRBC: 0 % (ref 0.0–0.2)

## 2024-03-08 LAB — AMMONIA: Ammonia: 16 umol/L (ref 9–35)

## 2024-03-08 LAB — PROCALCITONIN: Procalcitonin: 1.59 ng/mL

## 2024-03-08 LAB — C-REACTIVE PROTEIN: CRP: 19 mg/dL — ABNORMAL HIGH (ref <1.0)

## 2024-03-08 NOTE — Progress Notes (Signed)
 PHARMACY - PHYSICIAN COMMUNICATION CRITICAL VALUE ALERT - BLOOD CULTURE IDENTIFICATION (BCID)  Calvin Escobar is an 73 y.o. male who presented to [facility] on 03/06/[facility]24 with a chief complaint of AMS/urosepsis  Assessment:   Blood culture growing Klebsiella pneumoniae  Name of physician (or Provider) Contacted:  Dr. Alfornia  Current antibiotics:  Rocephin   Changes to prescribed antibiotics recommended: No changes needed at this time  Results for orders placed or performed during the hospital encounter of 03/06/24  Blood Culture ID Panel (Reflexed) (Collected: 03/06/2024  1:26 PM)  Result Value Ref Range   Enterococcus faecalis NOT DETECTED NOT DETECTED   Enterococcus Faecium NOT DETECTED NOT DETECTED   Listeria monocytogenes NOT DETECTED NOT DETECTED   Staphylococcus species NOT DETECTED NOT DETECTED   Staphylococcus aureus (BCID) NOT DETECTED NOT DETECTED   Staphylococcus epidermidis NOT DETECTED NOT DETECTED   Staphylococcus lugdunensis NOT DETECTED NOT DETECTED   Streptococcus species NOT DETECTED NOT DETECTED   Streptococcus agalactiae NOT DETECTED NOT DETECTED   Streptococcus pneumoniae NOT DETECTED NOT DETECTED   Streptococcus pyogenes NOT DETECTED NOT DETECTED   A.calcoaceticus-baumannii NOT DETECTED NOT DETECTED   Bacteroides fragilis NOT DETECTED NOT DETECTED   Enterobacterales DETECTED (A) NOT DETECTED   Enterobacter cloacae complex NOT DETECTED NOT DETECTED   Escherichia coli NOT DETECTED NOT DETECTED   Klebsiella aerogenes NOT DETECTED NOT DETECTED   Klebsiella oxytoca NOT DETECTED NOT DETECTED   Klebsiella pneumoniae DETECTED (A) NOT DETECTED   Proteus species NOT DETECTED NOT DETECTED   Salmonella species NOT DETECTED NOT DETECTED   Serratia marcescens NOT DETECTED NOT DETECTED   Haemophilus influenzae NOT DETECTED NOT DETECTED   Neisseria meningitidis NOT DETECTED NOT DETECTED   Pseudomonas aeruginosa NOT DETECTED NOT DETECTED   Stenotrophomonas  maltophilia NOT DETECTED NOT DETECTED   Candida albicans NOT DETECTED NOT DETECTED   Candida auris NOT DETECTED NOT DETECTED   Candida glabrata NOT DETECTED NOT DETECTED   Candida krusei NOT DETECTED NOT DETECTED   Candida parapsilosis NOT DETECTED NOT DETECTED   Candida tropicalis NOT DETECTED NOT DETECTED   Cryptococcus neoformans/gattii NOT DETECTED NOT DETECTED   CTX-M ESBL NOT DETECTED NOT DETECTED   Carbapenem resistance IMP NOT DETECTED NOT DETECTED   Carbapenem resistance KPC NOT DETECTED NOT DETECTED   Carbapenem resistance NDM NOT DETECTED NOT DETECTED   Carbapenem resist OXA 48 LIKE NOT DETECTED NOT DETECTED   Carbapenem resistance VIM NOT DETECTED NOT DETECTED    Calvin Escobar 03/08/2024  3:08 AM

## 2024-03-08 NOTE — Progress Notes (Signed)
 PROGRESS NOTE                                                                                                                                                                                                             Patient Demographics:    Calvin Escobar, is a 73 y.o. male, DOB - Jul 20, 1950, FMW:[id number]  Outpatient Primary MD for the patient is [name], MD    LOS - 2  Admit date - 03/06/2024    Chief Complaint  Patient presents with   Hypotension   Altered Mental Status       Brief Narrative (HPI from H&P)   73 y.o. 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 He was re admitted at beginning of September this year for sepsis secondary to UTI/gram negative bacteremia and AKI on CKD stage 3a. He presents today from his SNF for AMS x 2 days.  Found again to be in AKI with metabolic encephalopathy and admitted.   Subjective:   Patient in bed, appears comfortable, denies any headache, no fever, no chest pain or pressure, no shortness of breath , no abdominal pain. No new focal weakness.  Is mildly confused.    Assessment  & Plan :    Acute kidney injury superimposed on stage 3a chronic kidney disease (HCC) with hyperkalemia, history of colovesical fistula with recent repair, diverting ileostomy, indwelling Foley.  Multiple admissions for UTI.  73 year old male presenting to ED from SNF with concerns for AMS x 2 days and hypotension found to have acute on chronic kidney injury with hyperkalemia, nephrology  following, Lokelma  for hyperkalemia, hydrate, renal ultrasound noted with chronic bilateral hydronephrosis largely unchanged.  Renal function stabilized, currently not uremic, monitor with IV fluids and Lokelma , check CT abdomen pelvis noted.  Will discuss with urology as well on 03/09/2024.  Acute encephalopathy  -hx of dementia and unsure of baseline, due to AKI, possible UTI and multiple sedating medications at SNF, hold offending medications, supportive care for AKI and hyperkalemia as  above, empiric antibiotics and monitor.  Recurrent UTIs and possible pneumonia with Klebsiella bacteremia and UTI this admission.  Empiric antibiotics, CT abdomen pelvis pending, follow cultures.  Speech therapy to evaluate also to rule out ongoing aspiration.  Changed Foley catheter on 03/07/2024, he had indwelling Foley catheter upon admission could be the source of UTI, flush Foley every shift.  Anemia of chronic disease Baseline hgb 7-8. No signs of bleeding  Iron  studies showed low iron  in 02/2024 and given IV iron  on 9/5.   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 Mild to moderate MV regurgitation. Diastolic indeterminate   Chronic indwelling Foley catheter Foley appears to be working, 100cc of urine on bladder scan Foley care   Bipolar affective disorder, depressed (HCC) Working on getting med rec reconciled  On d/c a few weeks ago on mirtazapine , buspirone , abilify .  Hold gabapentin  with aKI   Dementia (HCC) Delirium precautions   GERD (gastroesophageal reflux disease) Continue  PPI   HLD (hyperlipidemia) Continue statin   OSA per chart on BiPAP [facility] BiPAP  Chronic hyponatremia At baseline,continue to trend          Condition - Extremely Guarded  Family Communication  : Called wife [phone] [name] on 03/07/2024 at 8:35 AM, message left  Code Status : Full code  Consults  : Nephrology  PUD Prophylaxis : PPI   Procedures  :     CT abdomen pelvis.  1. Circumferential urinary bladder wall thickening with associated bilateral moderate hydroureteronephrosis. Markedly limited evaluation on this noncontrast study. Underlying malignancy or infection are not excluded. 2. Gas within the collecting system may be due to Foley catheter placement. With differential diagnosis including emphysematous pyelitis. 3. Question slight wall thickening and irregularity of the rectum. Markedly limited evaluation on this noncontrast study. Underlying proctitis not excluded. 4. Prostatomegaly. 5. Right lower quadrant diverting ileostomy. 6. Colonic diverticulosis with no acute diverticulitis. 7.  Aortic Atherosclerosis (ICD10-I70.0).       Disposition Plan  :    Status is: Inpatient  DVT Prophylaxis  :    heparin  injection 5,000 Units Start: 03/06/24 2200    Lab Results  Component Value Date   PLT 231 03/08/2024    Diet :  Diet Order             DIET DYS 2 Room service appropriate? Yes with Assist; Fluid consistency: Thin  Diet effective now                    Inpatient Medications  Scheduled Meds:  azithromycin  500 mg Oral Daily   busPIRone   10 mg Oral TID   carvedilol   3.125 mg Oral BID WC   Chlorhexidine  Gluconate Cloth  6 each Topical Daily   finasteride   5 mg Oral Daily   folic acid   1 mg Oral Daily   heparin   5,000 Units Subcutaneous Q8H   pantoprazole   40 mg Oral Daily   sodium bicarbonate   1,300 mg Oral BID   sodium zirconium cyclosilicate   10 g Oral TID   tamsulosin   0.4 mg Oral Daily   Continuous Infusions:  cefTRIAXone  (ROCEPHIN )   IV Stopped (03/07/24 1822)   sodium bicarbonate  150 mEq in sterile water  1,150 mL infusion 75 mL/hr at 03/08/24 0936   PRN Meds:.acetaminophen  **OR** acetaminophen , metoprolol  tartrate, ondansetron  **OR** ondansetron  (ZOFRAN ) IV  Antibiotics  :    Anti-infectives (From admission, onward)    Start     Dose/Rate Route Frequency Ordered Stop   03/07/24 1800  cefTRIAXone  (ROCEPHIN ) 2 g in sodium chloride  0.9 % 100 mL IVPB        2 g 200 mL/hr over 30 Minutes Intravenous Every 24 hours 03/07/24 0842 03/14/24 1759   03/07/24 0930  azithromycin (ZITHROMAX) tablet 500 mg        500 mg Oral Daily 03/07/24 0842 03/12/24 0959   03/06/24 1800  cefTRIAXone  (ROCEPHIN ) 1 g in sodium chloride  0.9 % 100 mL IVPB  Status:  Discontinued        1 g 200 mL/hr over 30 Minutes Intravenous Every 24 hours 03/06/24 1703 03/07/24 0842         Objective:   Vitals:   03/08/24 0000 03/08/24 0337 03/08/24 0500 03/08/24 0800  BP: 126/62 (!) 126/101  128/68  Pulse: 76 80  74  Resp: 18 14  19   Temp:  98.4 F (36.9 C)  97.6 F (36.4 C)  TempSrc:  Oral  Oral  SpO2: 95%     Weight:   95.6 kg   Height:        Wt Readings from Last 3 Encounters:  03/08/24 95.6 kg  02/09/24 100.3 kg  01/12/24 102.7 kg     Intake/Output Summary (Last 24 hours) at 03/08/2024 0940 Last data filed at 03/08/2024 0338 Gross per 24 hour  Intake 1267 ml  Output 2150 ml  Net -883 ml     Physical Exam  Awake, minimally confused, oriented x 2, no new F.N deficits,   [place].AT,PERRAL Supple Neck, No JVD,   Symmetrical Chest wall movement, Good air movement bilaterally, CTAB RRR,No Gallops,Rubs or new Murmurs,  +ve B.Sounds, Abd Soft, No tenderness, Foley catheter in place, ileostomy in place No Cyanosis, Clubbing or edema       Data Review:    Recent Labs  Lab 03/06/24 1407 03/06/24 1413 03/07/24 0413 03/08/24 0133  WBC 6.8  --  6.7 6.7  HGB 7.7* 8.2* 8.0* 7.6*  HCT 26.2* 24.0* 25.7* 24.7*  PLT 192  --  207 231   MCV 88.5  --  86.0 84.9  MCH 26.0  --  26.8 26.1  MCHC 29.4*  --  31.1 30.8  RDW 15.3  --  15.4 15.3  LYMPHSABS 0.7  --   --  1.2  MONOABS 0.5  --   --  0.5  EOSABS 0.1  --   --  0.1  BASOSABS 0.1  --   --  0.1    Recent Labs  Lab 03/06/24 1407 03/06/24 1413 03/06/24 1449 03/06/24 1454 03/06/24 1701 03/07/24 0413 03/07/24 0551 03/08/24 0133  NA 131* 129*  --   --   --  133*  --  136  K 5.7* 5.5*  --   --   --  5.3*  --  4.5  CL 100 101  --   --   --  104  --  103  CO2 18*  --   --   --   --  16*  --  19*  ANIONGAP 13  --   --   --   --  13  --  14  GLUCOSE 96 100*  --   --   --  89  --  112*  BUN 71* 72*  --   --   --  68*  --  60*  CREATININE 5.49* 6.30*  --   --   --  5.52*  --  4.27*  AST 17  --   --   --   --  11*  --  <10*  ALT 8  --   --   --   --  9  --  8  ALKPHOS 87  --   --   --   --  66  --  53  BILITOT 0.2  --   --   --   --  0.6  --  0.5  ALBUMIN  3.0*  --   --   --   --  2.2*  --  1.9*  CRP  --   --   --   --   --   --  21.9* 19.0*  PROCALCITON 3.33  --   --   --   --  3.08  --  1.59  LATICACIDVEN  --   --   --  0.8  --   --   --   --   TSH  --   --   --   --  1.230  --   --   --   AMMONIA  --   --  15  --   --   --  20 16  BNP  --   --   --   --   --   --  573.1*  --   MG  --   --   --   --   --   --  1.6* 2.2  PHOS  --   --   --   --   --   --  5.2*  --   CALCIUM  8.8*  --   --   --   --  8.2*  --  7.9*      Recent Labs  Lab 03/06/24 1407 03/06/24 1449 03/06/24 1454 03/06/24 1701 03/07/24 0413 03/07/24 0551 03/08/24 0133  CRP  --   --   --   --   --  21.9* 19.0*  PROCALCITON 3.33  --   --   --  3.08  --  1.59  LATICACIDVEN  --   --  0.8  --   --   --   --   TSH  --   --   --  1.230  --   --   --   AMMONIA  --  15  --   --   --  20 16  BNP  --   --   --   --   --  573.1*  --   MG  --   --   --   --   --  1.6* 2.2  CALCIUM  8.8*  --   --   --  8.2*  --  7.9*     --------------------------------------------------------------------------------------------------------------- Lab Results  Component Value Date   CHOL 118 08/14/2023   HDL 39 (L) 08/14/2023   LDLCALC 54 08/14/2023   TRIG 168 (H) 08/14/2023   CHOLHDL 3.0 08/14/2023    Lab Results  Component Value Date   HGBA1C 5.0 01/01/2024   Recent Labs    03/06/24 1701  TSH 1.230   Recent Labs  03/06/24 1702  VITAMINB12 365     Radiology Report CT ABDOMEN PELVIS WO CONTRAST Result Date: 03/07/2024 CLINICAL DATA:  Abdominal pain, acute, nonlocalized History of colovesical fistula repair, diverting ileostomy, now with AKI and hydronephrosis despite having Foley EXAM: CT ABDOMEN AND PELVIS WITHOUT CONTRAST TECHNIQUE: Multidetector CT imaging of the abdomen and pelvis was performed following the standard protocol without IV contrast. RADIATION DOSE REDUCTION: This exam was performed according to the departmental dose-optimization program which includes automated exposure control, adjustment of the mA and/or kV according to patient size and/or use of iterative reconstruction technique. COMPARISON:  Ultrasound renal 03/06/2024 FINDINGS: Lower chest: Cardiac changes suggestive of anemia. Hepatobiliary: No focal liver abnormality. No gallstones, gallbladder wall thickening, or pericholecystic fluid. No biliary dilatation. Pancreas: Diffusely atrophic. No focal lesion. Otherwise normal pancreatic contour. No surrounding inflammatory changes. No main pancreatic ductal dilatation. Spleen: Normal in size without focal abnormality. Adrenals/Urinary Tract: No adrenal nodule bilaterally. Bilateral moderate hydroureteronephrosis. No nephroureterolithiasis bilaterally. Decompressed urinary bladder with circumferential urinary bladder wall thickening. Foley catheter in appropriate position. Gas noted within the urinary bladder lumen and left renal calyx (3:31). Stomach/Bowel: Stomach is within normal limits. No  evidence of small bowel wall thickening or dilatation. Question slight wall thickening and irregularity of the rectum. Right lower quadrant diverting ileostomy noted. The appendix is not definitely identified with no inflammatory changes in the right lower quadrant to suggest acute appendicitis. Vascular/Lymphatic: No abdominal aorta or iliac aneurysm. Mild atherosclerotic plaque of the aorta and its branches. No abdominal, pelvic, or inguinal lymphadenopathy. Reproductive: Prostate is enlarged measuring up to 4.8 cm. Other: No intraperitoneal free fluid. No intraperitoneal free gas. No organized fluid collection. Musculoskeletal: No abdominal wall hernia or abnormality. No suspicious lytic or blastic osseous lesions. No acute displaced fracture. Multilevel degenerative changes of the spine. IMPRESSION: 1. Circumferential urinary bladder wall thickening with associated bilateral moderate hydroureteronephrosis. Markedly limited evaluation on this noncontrast study. Underlying malignancy or infection are not excluded. 2. Gas within the collecting system may be due to Foley catheter placement. With differential diagnosis including emphysematous pyelitis. 3. Question slight wall thickening and irregularity of the rectum. Markedly limited evaluation on this noncontrast study. Underlying proctitis not excluded. 4. Prostatomegaly. 5. Right lower quadrant diverting ileostomy. 6. Colonic diverticulosis with no acute diverticulitis. 7.  Aortic Atherosclerosis (ICD10-I70.0). Electronically Signed   By: Morgane  Naveau M.D.   On: 03/07/2024 10:47   DG Chest Port 1 View Result Date: 03/07/2024 CLINICAL DATA:  Shortness of breath. EXAM: PORTABLE CHEST 1 VIEW COMPARISON:  03/06/2024 FINDINGS: Low volume film. Cardiopericardial silhouette is at upper limits of normal for size. Interstitial markings are diffusely coarsened with chronic features. Small nodular density in the peripheral right mid lung is stable. Subtle nodular  density in the left mid lung may reflect a confluence of shadows. No dense focal airspace consolidation. No substantial pleural effusion. Skin folds are seen over the upper lungs bilaterally. Telemetry leads overlie the chest. IMPRESSION: 1. Low volume film with chronic interstitial coarsening. 2. Subtle nodular density in the left mid lung may reflect a confluence of shadows. Pneumonia or underlying soft tissue lesion not excluded. Attention on follow-up recommended. 3. Calcified granuloma again noted right lung. Electronically Signed   By: Camellia Candle M.D.   On: 03/07/2024 07:40   US  RENAL Result Date: 03/06/2024 CLINICAL DATA:  [id number] AKI (acute kidney injury) [id number] EXAM: RENAL / URINARY TRACT ULTRASOUND COMPLETE COMPARISON:  02/05/2024, 09/28/2023 FINDINGS: Right Kidney: Renal measurements: 12.1 x 6.1 x 8.2  cm = volume: 313 mL.Normal echogenicity. No mass. Redemonstrated moderate to severe hydroureteronephrosis. No nephrolithiasis. Left Kidney: Renal measurements: 11.8 x 6.5 x 5.8 cm = volume: 234 mL. Normal echogenicity. No mass. Mild hydronephrosis. No nephrolithiasis. Bladder: Similar trabeculation of the urinary bladder wall. Other: None. IMPRESSION: 1. Similarly appearing bilateral hydronephrosis, moderate to severe on the right. 2. Trabeculation of the urinary bladder wall, which may be due to chronic bladder outlet obstruction. Electronically Signed   By: Rogelia Myers M.D.   On: 03/06/2024 17:14   DG Chest Port 1 View Result Date: 03/06/2024 CLINICAL DATA:  Shortness of breath, altered mental status and weakness. EXAM: PORTABLE CHEST 1 VIEW COMPARISON:  [id number] FINDINGS: The heart size and mediastinal contours are within normal limits. Very low bilateral lung volumes with bibasilar atelectasis. There is no evidence of pulmonary edema, consolidation, pneumothorax or pleural fluid. Stable calcified granuloma in the right lung. The visualized skeletal structures are unremarkable. IMPRESSION: Very  low bilateral lung volumes with bibasilar atelectasis. Electronically Signed   By: Marcey Moan M.D.   On: 03/06/2024 14:51     Signature  -   Lavada Stank M.D on 03/08/2024 at 9:40 AM   -  To page go to [email or web address]

## 2024-03-08 NOTE — Plan of Care (Signed)

## 2024-03-08 NOTE — Progress Notes (Signed)
 [facility] Progress Note  Subjective:  Pt seen in room He looks a bit more animated 1.8 L in and 3800 UOP yesterday   Vitals:   03/08/24 0000 03/08/24 0337 03/08/24 0500 03/08/24 0800  BP: 126/62 (!) 126/101  128/68  Pulse: 76 80  74  Resp: 18 14  19   Temp:  98.4 F (36.9 C)  97.6 F (36.4 C)  TempSrc:  Oral  Oral  SpO2: 95%     Weight:   95.6 kg   Height:        Exam: General: elderly WM, alert, no distress Neck: flat neck veind HEENT: dry mouth  CV: regular rate, no RG Lungs: BLBS, normal work of breathing Abd: soft, non-tender, non-distended; +ostomy bag +Foley   Home bp meds:  Coreg  6.25 bid Sodium bicarb 1300mg  bid Others: abilify , buspar , neurontin , norco, imodium , PPI, statin, primidone , flomax , effexor      Date   Creat  eGFR (ml/min) 2015- aug 2024 0.80- 1.24 May 2023  2.04 >> 1.13  AKI Jan 2025  1.52 >> 1.20   AKI  Feb 2025  1.44 >> 0.96   AKI Mar- April 2025 1.16- 1.29 May- June 2025 1.52- 1.89 July 2025  1.41- 2.75  Aug 2025  1.27- 1.66 43- 59 ml/min Sept 2025  3.16 >> 1.49   20 - 49 ml/min 10/03   5.49, 6.30 10  10/04   5.52  10  Renal US : 12.1/ .11.8 cm kidneys w/ mod-severe hydro on R and mild hydro on L UA 10/3 - mod Hb, mod LE, 100 prot, many bact, rbc/ wbc > 50, epis 0-5 UNa 38, UCr 79    Assessment/ Plan: AKI on CKD 3a: b/l creatinine 1.3- 1.7 from aug 2025, eGFR 43- 59 ml/min. Peak creat here was 6.3 on admission in the setting of AMS and gen'd weakness. Pt has chronic foley and colostomy bag, hx of UTIs. Hypotensive on arrival BP 96/70.  Possible recurrence of prior high-output ostomy causing vol depletion. FeNa 2% indeterminate, likely not accurate given chronic foley.  Renal US  showed similar appearing bilateral hydronephrosis, moderate to severe on the right & urinary bladder wall trabeculations likely from chronic bladder outlet obstruction. From imaging, longstanding hydronephrosis looks stable. Suspecting AKI from  primarily vol depletion, +/- other. Creatinine continues to improve down to 4.2 today. Continue IVFs, ^ to 100 cc/hr.  Hyperkalemia: resolved, cont renal diet  Metabolic acidosis: likely from AKI, improving w/ IV/ po sod bicarbonate.  Volume: looked dry on exam initially. No high GI losses. Still looks a bit dry.  Hx of colovesical fistula: w/ recent repair, diverting ileostomy, indwelling Foley H/o UTI's HFrEF: last EF 25-30% Chronic indwelling foley cath Depression/ bipolar d/o: on numerous psych medications Dementia     Myer Fret MD  [facility] 03/08/2024, 10:42 AM  Recent Labs  Lab 03/07/24 0413 03/07/24 0551 03/08/24 0133  HGB 8.0*  --  7.6*  ALBUMIN  2.2*  --  1.9*  CALCIUM  8.2*  --  7.9*  PHOS  --  5.2*  --   CREATININE 5.52*  --  4.27*  K 5.3*  --  4.5   No results for input(s): IRON , TIBC, FERRITIN in the last 168 hours. Inpatient medications:  azithromycin  500 mg Oral Daily   busPIRone   10 mg Oral TID   carvedilol   3.125 mg Oral BID WC   Chlorhexidine  Gluconate Cloth  6 each Topical Daily   finasteride   5 mg Oral Daily   folic acid   1 mg Oral Daily   heparin   5,000 Units Subcutaneous Q8H   pantoprazole   40 mg Oral Daily   sodium bicarbonate   1,300 mg Oral BID   sodium zirconium cyclosilicate   10 g Oral TID   tamsulosin   0.4 mg Oral Daily    cefTRIAXone  (ROCEPHIN )  IV Stopped (03/07/24 1822)   sodium bicarbonate  150 mEq in sterile water  1,150 mL infusion 75 mL/hr at 03/08/24 0936   acetaminophen  **OR** acetaminophen , metoprolol  tartrate, ondansetron  **OR** ondansetron  (ZOFRAN ) IV

## 2024-03-09 ENCOUNTER — Inpatient Hospital Stay (HOSPITAL_COMMUNITY)

## 2024-03-09 DIAGNOSIS — N1831 Chronic kidney disease, stage 3a: Secondary | ICD-10-CM | POA: Diagnosis not present

## 2024-03-09 DIAGNOSIS — N179 Acute kidney failure, unspecified: Secondary | ICD-10-CM | POA: Diagnosis not present

## 2024-03-09 LAB — CBC WITH DIFFERENTIAL/PLATELET
Abs Immature Granulocytes: 0.11 K/uL — ABNORMAL HIGH (ref 0.00–0.07)
Basophils Absolute: 0.1 K/uL (ref 0.0–0.1)
Basophils Relative: 2 %
Eosinophils Absolute: 0.2 K/uL (ref 0.0–0.5)
Eosinophils Relative: 3 %
HCT: 24.4 % — ABNORMAL LOW (ref 39.0–52.0)
Hemoglobin: 7.6 g/dL — ABNORMAL LOW (ref 13.0–17.0)
Immature Granulocytes: 2 %
Lymphocytes Relative: 19 %
Lymphs Abs: 1.4 K/uL (ref 0.7–4.0)
MCH: 26.3 pg (ref 26.0–34.0)
MCHC: 31.1 g/dL (ref 30.0–36.0)
MCV: 84.4 fL (ref 80.0–100.0)
Monocytes Absolute: 0.6 K/uL (ref 0.1–1.0)
Monocytes Relative: 8 %
Neutro Abs: 4.8 K/uL (ref 1.7–7.7)
Neutrophils Relative %: 66 %
Platelets: 227 K/uL (ref 150–400)
RBC: 2.89 MIL/uL — ABNORMAL LOW (ref 4.22–5.81)
RDW: 15.2 % (ref 11.5–15.5)
WBC: 7.3 K/uL (ref 4.0–10.5)
nRBC: 0 % (ref 0.0–0.2)

## 2024-03-09 LAB — COMPREHENSIVE METABOLIC PANEL WITH GFR
ALT: 9 U/L (ref 0–44)
AST: 11 U/L — ABNORMAL LOW (ref 15–41)
Albumin: 2 g/dL — ABNORMAL LOW (ref 3.5–5.0)
Alkaline Phosphatase: 54 U/L (ref 38–126)
Anion gap: 10 (ref 5–15)
BUN: 42 mg/dL — ABNORMAL HIGH (ref 8–23)
CO2: 23 mmol/L (ref 22–32)
Calcium: 7.7 mg/dL — ABNORMAL LOW (ref 8.9–10.3)
Chloride: 100 mmol/L (ref 98–111)
Creatinine, Ser: 2.98 mg/dL — ABNORMAL HIGH (ref 0.61–1.24)
GFR, Estimated: 21 mL/min — ABNORMAL LOW (ref >60)
Glucose, Bld: 109 mg/dL — ABNORMAL HIGH (ref 70–99)
Potassium: 3.9 mmol/L (ref 3.5–5.1)
Sodium: 133 mmol/L — ABNORMAL LOW (ref 135–145)
Total Bilirubin: 0.5 mg/dL (ref 0.0–1.2)
Total Protein: 5.5 g/dL — ABNORMAL LOW (ref 6.5–8.1)

## 2024-03-09 LAB — MAGNESIUM: Magnesium: 1.6 mg/dL — ABNORMAL LOW (ref 1.7–2.4)

## 2024-03-09 LAB — C-REACTIVE PROTEIN: CRP: 11.4 mg/dL — ABNORMAL HIGH (ref <1.0)

## 2024-03-09 LAB — PROCALCITONIN: Procalcitonin: 0.72 ng/mL

## 2024-03-09 LAB — AMMONIA: Ammonia: 21 umol/L (ref 9–35)

## 2024-03-09 MED ORDER — MAGNESIUM SULFATE 4 GM/100ML IV SOLN
4.0000 g | Freq: Once | INTRAVENOUS | Status: AC
  Administered 2024-03-09: 4 g via INTRAVENOUS
  Filled 2024-03-09: qty 100

## 2024-03-09 MED ORDER — FERROUS SULFATE 325 (65 FE) MG PO TABS
325.0000 mg | ORAL_TABLET | Freq: Two times a day (BID) | ORAL | Status: DC
  Administered 2024-03-09 – 2024-03-12 (×7): 325 mg via ORAL
  Filled 2024-03-09 (×7): qty 1

## 2024-03-09 MED ORDER — ALUM & MAG HYDROXIDE-SIMETH 200-200-20 MG/5ML PO SUSP
30.0000 mL | Freq: Four times a day (QID) | ORAL | Status: DC | PRN
  Administered 2024-03-09: 30 mL via ORAL
  Filled 2024-03-09: qty 30

## 2024-03-09 MED ORDER — QUETIAPINE FUMARATE 50 MG PO TABS
50.0000 mg | ORAL_TABLET | Freq: Every day | ORAL | Status: AC
Start: 2024-03-09 — End: ?
  Administered 2024-03-09 – 2024-03-19 (×11): 50 mg via ORAL
  Filled 2024-03-09 (×11): qty 1

## 2024-03-09 MED ORDER — HALOPERIDOL LACTATE 5 MG/ML IJ SOLN
2.0000 mg | Freq: Four times a day (QID) | INTRAMUSCULAR | Status: DC | PRN
  Administered 2024-03-09 – 2024-03-20 (×15): 2 mg via INTRAMUSCULAR
  Filled 2024-03-09 (×15): qty 1

## 2024-03-09 MED ORDER — HALOPERIDOL LACTATE 5 MG/ML IJ SOLN
5.0000 mg | Freq: Once | INTRAMUSCULAR | Status: AC
  Administered 2024-03-09: 5 mg via INTRAMUSCULAR
  Filled 2024-03-09: qty 1

## 2024-03-09 MED ORDER — QUETIAPINE FUMARATE 25 MG PO TABS
12.5000 mg | ORAL_TABLET | Freq: Once | ORAL | Status: AC
  Administered 2024-03-09: 12.5 mg via ORAL
  Filled 2024-03-09: qty 1

## 2024-03-09 NOTE — Progress Notes (Signed)
 Pharmacist Heart Failure Core Measure Documentation  Assessment: Calvin Escobar has an EF documented as 25-30% on 12/2023 by ECHO.  Rationale: Heart failure patients with left ventricular systolic dysfunction (LVSD) and an EF < 40% should be prescribed an angiotensin converting enzyme inhibitor (ACEI) or angiotensin receptor blocker (ARB) at discharge unless a contraindication is documented in the medical record.  This patient is not currently on an ACEI or ARB for HF.  This note is being placed in the record in order to provide documentation that a contraindication to the use of these agents is present for this encounter.  ACE Inhibitor or Angiotensin Receptor Blocker is contraindicated (specify all that apply)  []   ACEI allergy AND ARB allergy []   Angioedema []   Moderate or severe aortic stenosis []   Hyperkalemia []   Hypotension []   Renal artery stenosis [x]   Worsening renal function, preexisting renal disease or dysfunction   10/6 HFrEF: last EF 25-30%  per ECHO 7/202/5  severely decreased LVF. Mild to moderate MV regurgitation. Diastolic indeterminate. AKI on CKD 3a: b/l creatinine 1.3- 1.7 from aug 2025, eGFR 43- 59 ml/min. Peak SCr here was 6.3, which has improved, down to 2.98  on 03/09/2024. Patient was on Losartan   in recent past ~10/17/2023 but was discontinued/ held on 02/09/24 due to AKI, hyperkalemia. It was not on his PTA med list prior to this admission (03/06/24).   Levorn Gaskins, RPh Clinical Pharmacist 03/09/2024 3:24 PM

## 2024-03-09 NOTE — Plan of Care (Signed)
  Problem: Education: Goal: Knowledge of General Education information will improve Description: Including pain rating scale, medication(s)/side effects and non-pharmacologic comfort measures Outcome: Progressing   Problem: Clinical Measurements: Goal: Ability to maintain clinical measurements within normal limits will improve Outcome: Progressing   Problem: Coping: Goal: Level of anxiety will decrease Outcome: Progressing   Problem: Elimination: Goal: Will not experience complications related to bowel motility Outcome: Progressing   

## 2024-03-09 NOTE — Progress Notes (Signed)
 Speech Language Pathology Treatment: Dysphagia  Patient Details Name: Calvin Escobar MRN: [id number] DOB: 04-Feb-1951 Today's Date: 03/09/2024 Time: 8559-8551 SLP Time Calculation (min) (ACUTE ONLY): 8 min  Assessment / Plan / Recommendation Clinical Impression  Pt distracted- trying to get out of bed and sitter present needing frequent redirection. Observed with small piece graham cracker and he demonstrated prolonged mastication likely due to combination of cognitive impairments and lack of dentition. No s/s aspiration with straw sips thin. Called wife who state pt does not wear/have an upper plate and was advised to get teeth pulled. She is uncertain what texture he eats at [facility]. Discussed recommendation of leaving pt on Dys 2 (minced) for duration of this hospitalization and have SLP at SNF assess and upgrade if/when he is able. Wife in agreement with plan. Continue thin liquids, pills with thin (if difficulty give whole in puree). ST will sign off.   HPI HPI: Pt is a 73 y.o. male who presented from SNF for AMS x 2 days.  Found to be in AKI with metabolic encephalopathy and admitted. CXR (03/07/24) revealed, Subtle nodular density in the left mid lung may reflect a confluence of shadows. Pneumonia or underlying soft tissue lesion not excluded. MD ordered BSE  in order for speech therapy to evaluate to rule out ongoing aspiration. Pt has had prior UGI study in 04/2021 that showed  Mild esophageal dysmotility.  2. Tiny hiatal herni and a fair amount of residual contrast in the oropharynx.  From review of flouroscopy loops from UGI study, pt with appearance of secretions retained in pharynx that mixed with barium. BSE (06/06/23) noted, 3 ounce Yale water  challenge conducted at end of testing - resulting in pt overtly coughing - copiously and having mildly increased work of breathing... Suspect his primary risk is his known dysmotility exacerbated by his dyspnea.  Recommend continue dys2/thin for now .  PMH: CKD stage 3a, hx of VRE, HTN, HLD, OSA on cpap, ACD, dementia, bipolar, anxiety and depression, chronic indwelling foley catheter, non ischemic CM with HFrEF, hospitalization for colovesical fistula/sigmoid diveritculitis for which he underwent robotic rectosigmoid resection with anastomosis, colovesical fistula takedown and repair, diverting ileostomy, drainage of pelvic abscess and lysis of adhesions in 01/2024.      SLP Plan  Discharge SLP treatment due to (comment);Other (Comment) (see impression)          Recommendations  Diet recommendations: Dysphagia 2 (fine chop);Thin liquid Liquids provided via: Cup;Straw Medication Administration: Whole meds with liquid Supervision: Staff to assist with self feeding;Full supervision/cueing for compensatory strategies Compensations: Minimize environmental distractions;Slow rate;Small sips/bites Postural Changes and/or Swallow Maneuvers: Seated upright 90 degrees                  Oral care BID   Frequent or constant Supervision/Assistance Dysphagia, unspecified (R13.10)     Discharge SLP treatment due to (comment);Other (Comment) (see impression)     Dustin Olam Bull  03/09/2024, 3:11 PM

## 2024-03-09 NOTE — NC FL2 (Signed)
 [place]  MEDICAID FL2 LEVEL OF CARE FORM     IDENTIFICATION  Patient Name: Calvin Escobar Birthdate: [birth year] Sex: male Admission Date (Current Location): 03/06/2024  [facility] and [place] Number:  Producer, television/film/video and Address:  [facility], [address] [place], [place] [address]      Provider Number: [id number]  Attending Physician Name and Address:  Dennise Lavada POUR, MD  Relative Name and Phone Number:       Current Level of Care: Hospital Recommended Level of Care: Skilled Nursing Facility Prior Approval Number:    Date Approved/Denied:   PASRR Number: pending  Discharge Plan: SNF    Current Diagnoses: Patient Active Problem List   Diagnosis Date Noted   Acute kidney injury superimposed on stage 3a chronic kidney disease (HCC) with hyperkalemia 03/06/2024   Acute encephalopathy 03/06/2024   Chronic hyponatremia 03/06/2024   Acute kidney injury 02/05/2024   Malnutrition of moderate degree 01/14/2024   OSA on CPAP 01/14/2024   Diverticulitis of sigmoid colon s/p robotic colectomy 01/09/2024 01/14/2024   Ileostomy in place [facility]) 01/11/2024   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   History of COVID-19 01/09/2024   History of urinary retention 01/09/2024   Chronic indwelling Foley catheter 01/09/2024   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   Chronic HFrEF (heart failure with reduced ejection fraction) (HCC) 12/13/2023   Hematuria 12/02/2023   Medication management 11/05/2023   Hydronephrosis of left kidney 09/28/2023   Prostatic mass 09/28/2023   Weight loss, abnormal 08/14/2023   Hypocalcemia 08/14/2023   Hypoalbuminemia 08/14/2023   GERD (gastroesophageal reflux disease) 07/13/2023   Recurrent UTI 07/01/2023   LBBB (left bundle branch block) 06/02/2023   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   Acute kidney injury superimposed on chronic kidney disease 05/13/2023   Obesity (BMI 30-39.9) 05/13/2023   Tremor 05/08/2023   Lack  of appetite 05/08/2023   Chronic kidney disease, stage 3a (HCC) 03/07/2023   Anemia of chronic disease 03/07/2023   Limp 12/19/2022   Gynecomastia, male 12/19/2022   Chronic back pain 12/19/2022   Hypertension 11/21/2022   Generalized arthritis 07/18/2022   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   DCM (dilated cardiomyopathy) (HCC)    OSA treated with BiPAP    Primary insomnia 08/05/2017   Dementia (HCC)    HLD (hyperlipidemia)    Hyperkalemia    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   BPH (benign prostatic hyperplasia) 03/27/2012   Morbid obesity (HCC) 03/27/2012   Bipolar affective disorder, depressed (HCC) 05/10/2007   Chronic bronchitis (HCC) 05/10/2007    Orientation RESPIRATION BLADDER Height & Weight     Self, Place  Normal Indwelling catheter, Continent Weight: 213 lb 3 oz (96.7 kg) Height:  6' (182.9 cm)  BEHAVIORAL SYMPTOMS/MOOD NEUROLOGICAL BOWEL NUTRITION STATUS      Incontinent, Ileostomy Diet (See dc summary)  AMBULATORY STATUS COMMUNICATION OF NEEDS Skin   Extensive Assist Verbally Other (Comment) (wounds on perineum, penis, scrotum)                       Personal Care Assistance Level of Assistance  Bathing, Feeding, Dressing Bathing Assistance: Maximum assistance Feeding assistance: Limited assistance Dressing Assistance: Maximum assistance     Functional Limitations Info  Sight, Hearing Sight Info: Impaired Hearing Info: Impaired      SPECIAL CARE FACTORS FREQUENCY  PT (By licensed PT), OT (By licensed OT)     PT Frequency: 5x/week OT Frequency: 5x/week  Contractures Contractures Info: Not present    Additional Factors Info  Code Status, Allergies Code Status Info: Full Allergies Info: Albuterol            Current Medications (03/09/2024):  This is the current hospital active medication list Current Facility-Administered Medications  Medication Dose Route Frequency Provider Last Rate Last Admin   acetaminophen   (TYLENOL ) tablet 650 mg  650 mg Oral Q6H PRN Waddell Rake, MD   650 mg at 03/09/24 0252   Or   acetaminophen  (TYLENOL ) suppository 650 mg  650 mg Rectal Q6H PRN Waddell Rake, MD       alum & mag hydroxide-simeth (MAALOX/MYLANTA) 200-200-20 MG/5ML suspension 30 mL  30 mL Oral Q6H PRN Singh, Prashant K, MD   30 mL at 03/09/24 1349   busPIRone  (BUSPAR ) tablet 10 mg  10 mg Oral TID Singh, Prashant K, MD   10 mg at 03/09/24 1001   carvedilol  (COREG ) tablet 3.125 mg  3.125 mg Oral BID WC Singh, Prashant K, MD   3.125 mg at 03/09/24 1001   cefTRIAXone  (ROCEPHIN ) 2 g in sodium chloride  0.9 % 100 mL IVPB  2 g Intravenous Q24H Singh, Prashant K, MD 200 mL/hr at 03/08/24 1618 2 g at 03/08/24 1618   Chlorhexidine  Gluconate Cloth 2 % PADS 6 each  6 each Topical Daily Singh, Prashant K, MD   6 each at 03/09/24 1002   ferrous sulfate  tablet 325 mg  325 mg Oral BID WC Singh, Prashant K, MD   325 mg at 03/09/24 1001   finasteride  (PROSCAR ) tablet 5 mg  5 mg Oral Daily Singh, Prashant K, MD   5 mg at 03/09/24 1001   folic acid  (FOLVITE ) tablet 1 mg  1 mg Oral Daily Singh, Prashant K, MD   1 mg at 03/09/24 1001   haloperidol  lactate (HALDOL ) injection 2 mg  2 mg Intramuscular Q6H PRN Singh, Prashant K, MD   2 mg at 03/09/24 1349   heparin  injection 5,000 Units  5,000 Units Subcutaneous Q8H Waddell Rake, MD   5,000 Units at 03/09/24 9396   metoprolol  tartrate (LOPRESSOR ) injection 2.5 mg  2.5 mg Intravenous Q6H PRN Waddell Rake, MD       ondansetron  (ZOFRAN ) tablet 4 mg  4 mg Oral Q6H PRN Waddell Rake, MD       Or   ondansetron  (ZOFRAN ) injection 4 mg  4 mg Intravenous Q6H PRN Waddell Rake, MD       pantoprazole  (PROTONIX ) EC tablet 40 mg  40 mg Oral Daily Singh, Prashant K, MD   40 mg at 03/09/24 1001   tamsulosin  (FLOMAX ) capsule 0.4 mg  0.4 mg Oral Daily Singh, Prashant K, MD   0.4 mg at 03/09/24 1001     Discharge Medications: Please see discharge summary for a list of discharge  medications.  Relevant Imaging Results:  Relevant Lab Results:   Additional Information ss#[id number] [place], [place]

## 2024-03-09 NOTE — Consult Note (Signed)
 Urology Consult Note   Requesting Attending Physician:  Dennise Lavada POUR, MD Service Providing Consult: Urology  Consulting Attending: Dr. Shane   Reason for Consult: AoCK right side hydronephrosis and emphysematous cystitis  HPI: Calvin Escobar is seen in consultation for reasons noted above at the request of Dennise Lavada POUR, MD. Patient is a 73 y.o. male presenting to [facility] emergency department from his skilled nursing facility with report of altered mental status x 2 days.  Patient is known to our practice.  He recently underwent surgery for repair of colovesical fistula.  Dr. Sheldon of general surgery performed robotic rectosigmoid resection with anastomosis, colovesical fistula takedown and repair, diverting ileostomy, lysis of adhesions, and drainage of pelvic abscess-8/25.  CT A/P during workup noted right side hydronephrosis and 2 small locules of air and an extremely thickened bladder wall.  Foley catheter was exchanged 03/07/2024.  On my arrival patient was alert, with questionable orientation, up in chair.  He reported that he has had difficulty with bladder spasms, not surprising based on his imaging.  ------------------  Assessment:   73 y.o. male with AoCKD, BPH, and colovesical fistula   Recommendations: # Right hydronephrosis # AoCKD # Emphysematous cystitis # Colovesical fistula # Bladder spasm  Bladder wall is extremely thickened. Based on how far past do his Foley catheter exchange was and Ucx (+) for Klebsiella, 2/2 UTI.  There are 2 small locules of air appreciated in the bladder wall, along with a small amount of air in the collecting system.  This is possibly result Foley catheter exchange.   Continue to trend labs.  Serum creatinine has been quickly correcting.  Should patient decompensate acutely then would consider ureteral stent placement.  Otherwise, follow-up with his regular urologist on an outpatient basis.  Continue Proscar .   Please  notify us  at least a day before patient's discharge.  Would like to collect a CT cystogram to reassess colovesical fistula.  We typically avoid treatment of bladder spasm in demented elderly patients due to the contraindications of the medicine class.  I have had the least side effects from Levsin 0.125 Q4 PRN.  If patient does not begin to show improvement in the near future with antibiotic treatment, reasonable to trial this antispasmodic on a dose by dose basis.  Urology will follow peripherally  Case and plan discussed with Dr. Shane  Past Medical History: Past Medical History:  Diagnosis Date   Acute cystitis 05/13/2023   Acute hyponatremia 05/13/2023   Acute kidney injury superimposed on chronic kidney disease 05/13/2023   CLINICAL CONTEXT: 73 year old male with stage 3 CKD, colovesical fistula, recurrent UTIs, and recent candidal UTI. Recent renal ultrasound (09/28/2023) confirmed mild left hydronephrosis and prostatic mass protruding into bladder base. Iron  studies consistent with anemia of chronic disease (iron  32, TIBC 238, ferritin 420).   DIAGNOSTIC ASSESSMENT: ? 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 History of GI bleeding (07/2023). Currently on ferrous sulfate  supplementation.   DIAGNOSTIC ASSESSMENT: ? Primary  etiology (70-80% probability): Anemia of chronic disease/inflammation ? 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  EF 35 to 40% on echo 04/2021   Dementia (HCC)    Depression    Diarrhea 05/13/2023   E coli bacteremia 05/14/2023   High anion gap metabolic acidosis 07/13/2023   History of Clostridioides difficile colitis 08/15/2023   Associated with colovesical fistula? Recurrent antibiotic(s) requirement     Hyperkalemia    Associated with losartan   Advised patient low potassium diet 08/2022             Lab Results      Component    Value    Date/Time           K    4.0    12/27/2023 01:20 PM           K    4.4    12/18/2023 05:07 AM           K    4.4    12/17/2023 06:01 AM           K    4.7    12/16/2023 06:15 AM           K    4.9    12/15/2023 08:21 AM           K    4.4    12/14/2023 06:00 AM           K    5.0    Hyperlipidemia    Hypertension    Leukocytosis 12/11/2023   Lab Results      Component    Value    Date/Time           WBC    6.2    12/27/2023 01:20 PM           WBC    11.6 (H)    12/18/2023 05:07 AM           WBC    9.6    12/17/2023 06:01 AM           WBC    9.4    12/16/2023 06:15 AM           WBC    12.7 (H)    12/15/2023 08:21 AM           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  Work-up with cardiology in January 2021.     catheterization    Assessment:     1. Normal coronaries  2. NICM with EF 25-30%  3. Normal hemodynamics  .     Pneumonia    PUD (peptic ulcer disease) 01/12/2015   Formatting of this note might be different from the original.  Endoscopy esophageal stricture 2016     Septic shock from UTI 06/02/2023   Severe sepsis (HCC) 06/03/2023   SIRS (systemic  inflammatory response syndrome) (HCC) 12/11/2023   SKIN CANCER, HX OF 05/10/2007   Facial left check and forehead and r shoulder  F/w derm   Syncope 05/13/2023   Thrombocytopenia 06/02/2023   Lab Results  Component    Value    Date           PLT    301    07/19/2023           PLT    324    07/18/2023           PLT    268    07/16/2023           PLT    207    07/14/2023           PLT    228    07/13/2023             Lab Results      Component    Value    Date           ESRSEDRATE    60 (H)    05/08/2023     No results found for: CRP          Lab Results      Component    Value       Urinary catheter complication 06/24/2023   Urinary dribbling 03/14/2022   Urinary incontinence 11/21/2022      Urinary Incontinence: They are wearing disposable diapers daily due to urinary incontinence, primarily nocturnal, and are currently on Tamsulosin  (Flomax ). We will refer them to Urology for further evaluation and potential treatment options and continue Tamsulosin  until they are seen by Urology.     Urinary tract infection without hematuria 06/02/2023   UTI (urinary tract infection) 07/01/2023    Past Surgical History:  Past Surgical History:  Procedure Laterality Date   COLECTOMY, SIGMOID, ROBOT-ASSISTED N/A 01/09/2024   Procedure: ROBOTIC LOW ANTERIOR RESECTION WITH ANASTOMOSIS, DRAINAGE OF PELVIC ABSCESS, COLOVESICAL FISTULA TAKEDOWN AND REPAIR, INTRAOPERATIVE ASSESSMENT OF TISSUE PERFUSION USING ICG DYE, AND BILATERAL TAP BLOCK;  Surgeon: Sheldon Standing, MD;  Location: [facility];  Service: General;  Laterality: N/A;  ROBOTIC RESECTION OF COLON RECTOSIGMOID   COLONOSCOPY     COLONOSCOPY N/A 12/05/2023   Procedure: COLONOSCOPY;  Surgeon: Leigh Standing SQUIBB, MD;  Location: [facility] ENDOSCOPY;  Service: Gastroenterology;  Laterality: N/A;   CYSTOSCOPY WITH INDOCYANINE GREEN  IMAGING (ICG) N/A 01/09/2024   Procedure: CYSTOSCOPY WITH INDOCYANINE GREEN  IMAGING (ICG);  Surgeon: Watt Rush, MD;  Location: [facility];   Service: Urology;  Laterality: N/A;  CYSTO WITH FIREFLY INJECTION   FLEXIBLE SIGMOIDOSCOPY N/A 01/09/2024   Procedure: SIGMOIDOSCOPY, FLEXIBLE;  Surgeon: Sheldon Standing, MD;  Location: [facility];  Service: General;  Laterality: N/A;   LAPAROSCOPIC LOOP COLOSTOMY N/A 01/09/2024   Procedure: CREATION OF DIVERTING LOOP ILEOSTOMY;  Surgeon: Sheldon Standing, MD;  Location: [facility];  Service: General;  Laterality: N/A;   RIGHT/LEFT HEART CATH AND CORONARY ANGIOGRAPHY N/A 07/03/2019   Procedure: RIGHT/LEFT HEART CATH AND CORONARY ANGIOGRAPHY;  Surgeon: Cherrie Toribio SAUNDERS, MD;  Location: [facility] INVASIVE CV LAB;  Service: Cardiovascular;  Laterality: N/A;   ROBOTIC ASSISTED LAPAROSCOPIC LYSIS OF ADHESION  01/09/2024   Procedure: LYSIS, ADHESIONS, ROBOT-ASSISTED, LAPAROSCOPIC;  Surgeon: Sheldon Standing, MD;  Location: [facility];  Service: General;;    Medication: Current Facility-Administered Medications  Medication Dose Route Frequency Provider Last Rate Last Admin   acetaminophen  (TYLENOL ) tablet 650 mg  650 mg Oral Q6H PRN Waddell Rake, MD   650 mg at 03/09/24 0252   Or   acetaminophen  (TYLENOL ) suppository 650 mg  650 mg Rectal Q6H PRN Waddell Rake, MD       busPIRone  (BUSPAR ) tablet 10 mg  10 mg Oral TID Singh, Prashant K, MD   10 mg at 03/09/24 1001   carvedilol  (COREG ) tablet 3.125 mg  3.125 mg Oral BID WC Singh, Prashant K, MD   3.125 mg at 03/09/24 1001   cefTRIAXone  (ROCEPHIN ) 2 g in sodium chloride  0.9 % 100 mL IVPB  2 g Intravenous Q24H Singh, Prashant K, MD 200 mL/hr at 03/08/24 1618 2 g at 03/08/24 1618   Chlorhexidine  Gluconate Cloth 2 % PADS 6 each  6 each Topical Daily Singh, Prashant K, MD   6 each at 03/09/24 1002   ferrous sulfate  tablet 325 mg  325 mg Oral BID WC Singh, Prashant K, MD   325 mg at 03/09/24 1001   finasteride  (PROSCAR ) tablet 5 mg  5 mg Oral Daily Singh, Prashant K, MD   5 mg at 03/09/24 1001   folic acid  (FOLVITE ) tablet 1 mg  1 mg Oral Daily Singh, Prashant K, MD   1 mg at 03/09/24  1001   heparin  injection 5,000 Units  5,000 Units Subcutaneous Q8H Waddell Rake, MD   5,000 Units at 03/09/24 9396   metoprolol  tartrate (LOPRESSOR ) injection 2.5 mg  2.5 mg Intravenous Q6H PRN Waddell Rake, MD       ondansetron  (ZOFRAN ) tablet 4 mg  4 mg Oral Q6H PRN Waddell Rake, MD       Or   ondansetron  (ZOFRAN ) injection 4 mg  4 mg Intravenous Q6H PRN Waddell Rake, MD       pantoprazole  (PROTONIX ) EC tablet 40 mg  40 mg Oral Daily Singh, Prashant K, MD   40 mg at 03/09/24 1001   tamsulosin  (FLOMAX ) capsule 0.4 mg  0.4 mg Oral Daily Singh, Prashant K, MD   0.4 mg at 03/09/24 1001    Allergies: Allergies  Allergen Reactions   Albuterol  Palpitations and Other (See Comments)    Supraventricular Tachycardia     Social History: Social History   Tobacco Use   Smoking status: Never   Smokeless tobacco: Never  Vaping Use   Vaping status: Never Used  Substance Use Topics   Alcohol use: Not Currently   Drug use: No    Family History Family History  Problem Relation Age of Onset   Hypertension Mother    Cancer Father    Bone cancer Sister     Review of Systems  Unable to perform ROS: Dementia  Genitourinary:        Bladder spasm     Objective   Vital signs in last 24 hours: BP 123/80   Pulse 70   Temp 97.9 F (36.6 C) (Axillary)   Resp (!) 23   Ht 6' (1.829 m)   Wt 96.7 kg   SpO2 98%   BMI 28.91 kg/m   Physical Exam General: Confused, chronically ill HEENT: [place]/AT Pulmonary: Normal work of breathing Cardiovascular: no cyanosis Abdomen: Soft, NTTP, nondistended GU: Foley catheter in place draining clear yellow urine   Most Recent Labs: Lab Results  Component Value Date   WBC 7.3 03/09/2024   HGB 7.6 (L) 03/09/2024   HCT 24.4 (L) 03/09/2024   PLT 227 03/09/2024    Lab Results  Component Value Date   NA 133 (L) 03/09/2024   K 3.9 03/09/2024   CL 100 03/09/2024   CO2 23 03/09/2024   BUN 42 (H) 03/09/2024   CREATININE 2.98 (H) 03/09/2024    CALCIUM  7.7 (L) 03/09/2024   MG 1.6 (L) 03/09/2024   PHOS 5.2 (H) 03/07/2024  Lab Results  Component Value Date   INR 1.2 02/06/2024   APTT 33 07/02/2023     Urine Culture: @[id number] (laburin,org,[id number],[id number])@   IMAGING: No results found.  ------  Ole Bourdon, NP Pager: [phone]   Please contact the urology consult pager with any further questions/concerns.

## 2024-03-09 NOTE — Progress Notes (Signed)
 Patient got agitated during this shift, climbing out of bed , pulled out hs ostomy bag with mitten on.  On call MD notified and an order for one time dose of Seroquel received and given.  Will continue to monitor

## 2024-03-09 NOTE — Progress Notes (Signed)
 [facility] Progress Note  Subjective:  Pt seen in room, no complaints today No new issues.  Per RN has pulled out 7 IVs this admission.   3.3L UOP yesterday   Vitals:   03/08/24 2317 03/09/24 0406 03/09/24 0800 03/09/24 0828  BP: 137/85 131/61 123/80   Pulse: 73 63 77 70  Resp: 14 18 17  (!) 23  Temp: 98.7 F (37.1 C) 97.9 F (36.6 C)    TempSrc: Oral Axillary    SpO2: 97% 96% 98%   Weight:  96.7 kg    Height:        Exam: General: elderly WM, alert, no distress HEENT: dry mouth  CV: regular rate, no RG Lungs: BLBS, normal work of breathing Abd: soft, non-tender, non-distended; +ostomy bag +Foley with clear yellow urine   Home bp meds:  Coreg  6.25 bid Sodium bicarb 1300mg  bid Others: abilify , buspar , neurontin , norco, imodium , PPI, statin, primidone , flomax , effexor      Date   Creat  eGFR (ml/min) 2015- aug 2024 0.80- 1.24 May 2023  2.04 >> 1.13  AKI Jan 2025  1.52 >> 1.20   AKI  Feb 2025  1.44 >> 0.96   AKI Mar- April 2025 1.16- 1.29 May- June 2025 1.52- 1.89 July 2025  1.41- 2.75  Aug 2025  1.27- 1.66 43- 59 ml/min Sept 2025  3.16 >> 1.49   20 - 49 ml/min 10/03   5.49, 6.30 10  10/04   5.52  10  Renal US : 12.1/ .11.8 cm kidneys w/ mod-severe hydro on R and mild hydro on L UA 10/3 - mod Hb, mod LE, 100 prot, many bact, rbc/ wbc > 50, epis 0-5 UNa 38, UCr 79    Assessment/ Plan: AKI on CKD 3a: b/l creatinine 1.3- 1.7 from aug 2025, eGFR 43- 59 ml/min. Peak creat here was 6.3 on admission in the setting of AMS and gen'd weakness. Pt has chronic foley and colostomy bag, hx of UTIs. Hypotensive on arrival BP 96/70.  Possible recurrence of prior high-output ostomy causing vol depletion. FeNa 2% indeterminate.  Renal US  showed similar appearing bilateral hydronephrosis, moderate to severe on the right & urinary bladder wall trabeculations likely from chronic bladder outlet obstruction. From imaging, longstanding hydronephrosis looks stable.  Suspecting AKI from primarily vol depletion, +/- other. Creatinine continues to improve down to 3 today. Will d/c IVF today to see if he can maintain sufficient oral intake.   Borderline polyuria:  denies h/o lithium  use, no h/o hypernatremia, recent urine SG 1.025last month- suspect just a post AKI diuresis.  Monitor for now particularly off IVF.  Metabolic acidosis: likely from AKI, improved w/ IV/ po sod bicarbonate.  Off bicarb now. Hx of colovesical fistula: w/ recent repair, diverting ileostomy, indwelling Foley H/o UTI's HFrEF: last EF 25-30% Chronic indwelling foley cath Depression/ bipolar d/o: on numerous psych medications Dementia  Manuelita Barters MD [facility] Pager [phone]   Recent Labs  Lab 03/07/24 0551 03/08/24 0133 03/09/24 0333  HGB  --  7.6* 7.6*  ALBUMIN   --  1.9* 2.0*  CALCIUM   --  7.9* 7.7*  PHOS 5.2*  --   --   CREATININE  --  4.27* 2.98*  K  --  4.5 3.9   No results for input(s): IRON , TIBC, FERRITIN in the last 168 hours. Inpatient medications:  azithromycin  500 mg Oral Daily   busPIRone   10 mg Oral TID   carvedilol   3.125 mg Oral BID WC   Chlorhexidine  Gluconate  Cloth  6 each Topical Daily   ferrous sulfate   325 mg Oral BID WC   finasteride   5 mg Oral Daily   folic acid   1 mg Oral Daily   heparin   5,000 Units Subcutaneous Q8H   pantoprazole   40 mg Oral Daily   tamsulosin   0.4 mg Oral Daily    cefTRIAXone  (ROCEPHIN )  IV 2 g (03/08/24 1618)   acetaminophen  **OR** acetaminophen , metoprolol  tartrate, ondansetron  **OR** ondansetron  (ZOFRAN ) IV

## 2024-03-09 NOTE — TOC Initial Note (Signed)
 Transition of Care [facility]) - Initial/Assessment Note    Patient Details  Name: Calvin Escobar MRN: [id number] Date of Birth: November 03, 1950  [facility] CM/SW Contact:    Inocente GORMAN Kindle, LCSW Phone Number: 03/09/2024, 3:44 PM  Clinical Narrative:                 Patient admitted from [facility] where he has been undergoing rehab. Per [facility] patient will need a new pasrr and insurance approval. CSW submitted clinicals to insurance Ref# [id number].  CSW submitted clinicals to Pasrr for review.   Expected Discharge Plan: Skilled Nursing Facility Barriers to Discharge: Awaiting State Approval (PASRR), Continued Medical Work up, English as a second language teacher   Patient Goals and CMS Choice Patient states their goals for this hospitalization and ongoing recovery are:: Return to rehab          Expected Discharge Plan and Services In-house Referral: Clinical Social Work   Post Acute Care Choice: Skilled Nursing Facility Living arrangements for the past 2 months: Single Family Home, Skilled Nursing Facility                                      Prior Living Arrangements/Services Living arrangements for the past 2 months: Single Family Home, Skilled Nursing Facility Lives with:: Spouse Patient language and need for interpreter reviewed:: Yes Do you feel safe going back to the place where you live?: Yes      Need for Family Participation in Patient Care: Yes (Comment) Care giver support system in place?: Yes (comment) Current home services: DME Criminal Activity/Legal Involvement Pertinent to Current Situation/Hospitalization: No - Comment as needed  Activities of Daily Living   ADL Screening (condition at time of admission) Independently performs ADLs?: No Does the patient have a NEW difficulty with bathing/dressing/toileting/self-feeding that is expected to last >3 days?: No Does the patient have a NEW difficulty with getting in/out of bed, walking, or  climbing stairs that is expected to last >3 days?: No Does the patient have a NEW difficulty with communication that is expected to last >3 days?: No Is the patient deaf or have difficulty hearing?: No Does the patient have difficulty seeing, even when wearing glasses/contacts?: No Does the patient have difficulty concentrating, remembering, or making decisions?: No  Permission Sought/Granted Permission sought to share information with : Facility Medical sales representative, Family Supports Permission granted to share information with : No  Share Information with NAMEDhaval, [place] [phone]/ [phone]  Permission granted to share info w AGENCY: [facility]        Emotional Assessment Appearance:: Appears stated age Attitude/Demeanor/Rapport: Unable to Assess Affect (typically observed): Unable to Assess Orientation: : Oriented to Self Alcohol / Substance Use: Not Applicable Psych Involvement: No (comment)  Admission diagnosis:  Hyperkalemia [E87.5] AKI (acute kidney injury) [N17.9] Acute kidney injury superimposed on stage 3a chronic kidney disease (HCC) [N17.9, N18.31] Patient Active Problem List   Diagnosis Date Noted   Acute kidney injury superimposed on stage 3a chronic kidney disease (HCC) with hyperkalemia 03/06/2024   Acute encephalopathy 03/06/2024   Chronic hyponatremia 03/06/2024   Acute kidney injury 02/05/2024   Malnutrition of moderate degree 01/14/2024   OSA on CPAP 01/14/2024   Diverticulitis of sigmoid colon s/p robotic colectomy 01/09/2024 01/14/2024   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/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   History of COVID-19 01/09/2024   History of urinary retention 01/09/2024  Chronic indwelling Foley catheter 01/09/2024   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   Chronic HFrEF (heart failure with reduced ejection fraction) (HCC) 12/13/2023   Hematuria 12/02/2023   Medication management 11/05/2023   Hydronephrosis of  left kidney 09/28/2023   Prostatic mass 09/28/2023   Weight loss, abnormal 08/14/2023   Hypocalcemia 08/14/2023   Hypoalbuminemia 08/14/2023   GERD (gastroesophageal reflux disease) 07/13/2023   Recurrent UTI 07/01/2023   LBBB (left bundle branch block) 06/02/2023   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   Acute kidney injury superimposed on chronic kidney disease 05/13/2023   Obesity (BMI 30-39.9) 05/13/2023   Tremor 05/08/2023   Lack of appetite 05/08/2023   Chronic kidney disease, stage 3a (HCC) 03/07/2023   Anemia of chronic disease 03/07/2023   Limp 12/19/2022   Gynecomastia, male 12/19/2022   Chronic back pain 12/19/2022   Hypertension 11/21/2022   Generalized arthritis 07/18/2022   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   DCM (dilated cardiomyopathy) (HCC)    OSA treated with BiPAP    Primary insomnia 08/05/2017   Dementia (HCC)    HLD (hyperlipidemia)    Hyperkalemia    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   BPH (benign prostatic hyperplasia) 03/27/2012   Morbid obesity (HCC) 03/27/2012   Bipolar affective disorder, depressed (HCC) 05/10/2007   Chronic bronchitis (HCC) 05/10/2007   PCP:  [name], MD Pharmacy:   [facility] - [place], [place] - [address] [place] [address] Phone: [phone] Fax: [phone]     Social Drivers of Health (SDOH) Social History: SDOH Screenings   Food Insecurity: Patient Unable To Answer (03/07/2024)  Housing: Unknown (03/07/2024)  Transportation Needs: Patient Unable To Answer (03/07/2024)  Utilities: Not At Risk (03/07/2024)  Alcohol Screen: Low Risk  (10/10/2023)  Depression (PHQ2-9): Low Risk  (12/09/2023)  Recent Concern: Depression (PHQ2-9) - Medium Risk (09/16/2023)  Financial Resource Strain: Low Risk  (10/10/2023)  Physical Activity: Sufficiently Active (10/10/2023)  Social Connections: Moderately Integrated (03/07/2024)  Recent Concern: Social Connections - Socially Isolated  (12/11/2023)  Stress: No Stress Concern Present (10/10/2023)  Tobacco Use: Low Risk  (02/05/2024)  Health Literacy: Adequate Health Literacy (10/10/2023)   SDOH Interventions:     Readmission Risk Interventions    03/09/2024    3:43 PM 01/14/2024    2:09 PM 07/04/2023    1:27 PM  Readmission Risk Prevention Plan  Transportation Screening Complete  Complete  PCP or Specialist Appt within 5-7 Days   Complete  Home Care Screening   Complete  Medication Review (RN CM)   Complete  Medication Review (RN Care Manager) Complete Complete   PCP or Specialist appointment within 3-5 days of discharge Complete Complete   HRI or Home Care Consult Complete Complete   SW Recovery Care/Counseling Consult Complete Complete   Palliative Care Screening Not Applicable Not Applicable   Skilled Nursing Facility Complete Complete

## 2024-03-09 NOTE — Progress Notes (Signed)
 RE: Calvin Escobar Date of Birth: 08/15/2050 Date: 03/09/24  Please be advised that the above-named patient has a primary diagnosis of dementia which supersedes any psychiatric diagnosis. Patient will require a short-term nursing home stay - anticipated 30 days or less for rehabilitation and strengthening.  The plan is for return home.

## 2024-03-09 NOTE — Progress Notes (Signed)
 PROGRESS NOTE                                                                                                                                                                                                             Patient Demographics:    Calvin Escobar, is a 73 y.o. male, DOB - 1950/12/08, FMW:[id number]  Outpatient Primary MD for the patient is [name], MD    LOS - 3  Admit date - 03/06/2024    Chief Complaint  Patient presents with   Hypotension   Altered Mental Status       Brief Narrative (HPI from H&P)   73 y.o. 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 He was re admitted at beginning of September this year for sepsis secondary to UTI/gram negative bacteremia and AKI on CKD stage 3a. He presents today from his SNF for AMS x 2 days.  Found again to be in AKI with metabolic encephalopathy and admitted.   Subjective:   Patient in bed, appears comfortable, denies any headache, no fever, no chest pain or pressure, no shortness of breath , no abdominal pain. No new focal weakness, remains mildly confused.   Assessment  & Plan :    Acute kidney injury superimposed on stage 3a chronic kidney disease (HCC) with hyperkalemia, history of colovesical fistula with recent repair, diverting ileostomy, indwelling Foley.  Multiple admissions for UTI.  73 year old male presenting to ED from SNF with concerns for AMS x 2 days and hypotension found to have acute on chronic kidney injury with hyperkalemia, nephrology  following, Lokelma  for hyperkalemia, hydrate, renal ultrasound noted with chronic bilateral hydronephrosis largely unchanged.  Renal function stabilized, currently not uremic, monitor with IV fluids and Lokelma , check CT abdomen pelvis noted, ?  emphysematous pyelitis , Will discuss with urology as well on 03/09/2024.  Acute encephalopathy  - hx of dementia and unsure of baseline, due to AKI, possible UTI and multiple sedating medications at SNF, hold offending medications, supportive care for AKI  and hyperkalemia as above, empiric antibiotics and monitor.  Recurrent UTIs and possible pneumonia with Klebsiella bacteremia and UTI this admission.  Empiric antibiotics, CT abdomen pelvis pending, follow cultures.  Speech therapy to evaluate also to rule out ongoing aspiration.  Changed Foley catheter on 03/07/2024, he had indwelling Foley catheter upon admission could be the source of UTI, flush Foley every shift.  Anemia of chronic disease Baseline hgb 7-8. No signs of bleeding  Iron  studies showed low iron  in 02/2024 and given IV iron  on 9/5.   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 Mild to moderate MV regurgitation. Diastolic indeterminate   Chronic indwelling Foley catheter Foley appears to be working, 100cc of urine on bladder scan Foley care   Bipolar affective disorder, depressed (HCC) Working on getting med rec reconciled  On d/c a few weeks ago on mirtazapine , buspirone , abilify .  Hold gabapentin  with aKI   Dementia (HCC) Delirium precautions   GERD  (gastroesophageal reflux disease) Continue PPI   HLD (hyperlipidemia) Continue statin   OSA per chart on BiPAP [facility] BiPAP  Chronic hyponatremia At baseline,continue to trend   Hypomagnesemia.  Replaced.      Condition - Extremely Guarded  Family Communication  : Called wife [phone] [name] on 03/07/2024 at 8:35 AM, message left  Code Status : Full code  Consults  : Nephrology  PUD Prophylaxis : PPI   Procedures  :     CT abdomen pelvis.  1. Circumferential urinary bladder wall thickening with associated bilateral moderate hydroureteronephrosis. Markedly limited evaluation on this noncontrast study. Underlying malignancy or infection are not excluded. 2. Gas within the collecting system may be due to Foley catheter placement. With differential diagnosis including emphysematous pyelitis. 3. Question slight wall thickening and irregularity of the rectum. Markedly limited evaluation on this noncontrast study. Underlying proctitis not excluded. 4. Prostatomegaly. 5. Right lower quadrant diverting ileostomy. 6. Colonic diverticulosis with no acute diverticulitis. 7.  Aortic Atherosclerosis (ICD10-I70.0).       Disposition Plan  :    Status is: Inpatient  DVT Prophylaxis  :    heparin  injection 5,000 Units Start: 03/06/24 2200    Lab Results  Component Value Date   PLT 227 03/09/2024    Diet :  Diet Order             DIET DYS 2 Room service appropriate? Yes with Assist; Fluid consistency: Thin  Diet effective now                    Inpatient Medications  Scheduled Meds:  azithromycin  500 mg Oral Daily   busPIRone   10 mg Oral TID   carvedilol   3.125 mg Oral BID WC   Chlorhexidine  Gluconate Cloth  6 each Topical Daily   ferrous sulfate   325 mg Oral BID WC   finasteride   5 mg Oral Daily   folic acid   1 mg Oral Daily   heparin   5,000 Units Subcutaneous Q8H   pantoprazole   40 mg Oral Daily   tamsulosin   0.4 mg Oral Daily   Continuous Infusions:   cefTRIAXone  (ROCEPHIN )  IV 2 g (03/08/24 1618)   magnesium  sulfate bolus IVPB 4 g (03/09/24 0609)   PRN Meds:.acetaminophen  **OR** acetaminophen , metoprolol  tartrate, ondansetron  **OR** ondansetron  (ZOFRAN ) IV  Antibiotics  :    Anti-infectives (From admission, onward)    Start     Dose/Rate Route Frequency Ordered Stop   03/07/24 1800  cefTRIAXone  (ROCEPHIN ) 2 g in sodium chloride  0.9 % 100 mL IVPB        2 g 200 mL/hr over 30 Minutes Intravenous Every 24 hours 03/07/24 0842 03/14/24 1759   03/07/24 0930  azithromycin (ZITHROMAX) tablet 500 mg        500 mg Oral Daily 03/07/24 0842 03/12/24 0959   03/06/24 1800  cefTRIAXone  (ROCEPHIN ) 1 g in sodium chloride  0.9 % 100 mL IVPB  Status:  Discontinued        1 g 200 mL/hr over 30 Minutes Intravenous Every 24 hours 03/06/24 1703 03/07/24 0842         Objective:   Vitals:   03/08/24 1556 03/08/24 2000 03/08/24 2317 03/09/24 0406  BP: (!) 147/85 136/80 137/85 131/61  Pulse: 83  73 63  Resp: 14 20 14 18   Temp: 97.7 F (36.5 C) 98.9 F (37.2 C) 98.7 F (37.1 C) 97.9 F (36.6 C)  TempSrc: Oral Oral Oral Axillary  SpO2:   97% 96%  Weight:    96.7 kg  Height:        Wt Readings from Last 3 Encounters:  03/09/24 96.7 kg  02/09/24 100.3 kg  01/12/24 102.7 kg     Intake/Output Summary (Last 24 hours) at 03/09/2024 0711 Last data filed at 03/09/2024 0609 Gross per 24 hour  Intake 5186.05 ml  Output 3825 ml  Net 1361.05 ml     Physical Exam  Awake, minimally confused, oriented x 2, no new F.N deficits,   [place].AT,PERRAL Supple Neck, No JVD,   Symmetrical Chest wall movement, Good air movement bilaterally, CTAB RRR,No Gallops,Rubs or new Murmurs,  +ve B.Sounds, Abd Soft, No tenderness, Foley catheter in place, ileostomy in place No Cyanosis, Clubbing or edema       Data Review:    Recent Labs  Lab 03/06/24 1407 03/06/24 1413 03/07/24 0413 03/08/24 0133 03/09/24 0333  WBC 6.8  --  6.7 6.7 7.3  HGB 7.7* 8.2*  8.0* 7.6* 7.6*  HCT 26.2* 24.0* 25.7* 24.7* 24.4*  PLT 192  --  207 231 227  MCV 88.5  --  86.0 84.9 84.4  MCH 26.0  --  26.8 26.1 26.3  MCHC 29.4*  --  31.1 30.8 31.1  RDW 15.3  --  15.4 15.3 15.2  LYMPHSABS 0.7  --   --  1.2 1.4  MONOABS 0.5  --   --  0.5 0.6  EOSABS 0.1  --   --  0.1 0.2  BASOSABS 0.1  --   --  0.1 0.1    Recent Labs  Lab 03/06/24 1407 03/06/24 1413 03/06/24 1449 03/06/24 1454 03/06/24 1701 03/07/24 0413 03/07/24 0551 03/08/24 0133 03/09/24 0333  NA 131* 129*  --   --   --  133*  --  136 133*  K 5.7* 5.5*  --   --   --  5.3*  --  4.5 3.9  CL 100 101  --   --   --  104  --  103 100  CO2 18*  --   --   --   --  16*  --  19* 23  ANIONGAP  13  --   --   --   --  13  --  14 10  GLUCOSE 96 100*  --   --   --  89  --  112* 109*  BUN 71* 72*  --   --   --  68*  --  60* 42*  CREATININE 5.49* 6.30*  --   --   --  5.52*  --  4.27* 2.98*  AST 17  --   --   --   --  11*  --  <10* 11*  ALT 8  --   --   --   --  9  --  8 9  ALKPHOS 87  --   --   --   --  66  --  53 54  BILITOT 0.2  --   --   --   --  0.6  --  0.5 0.5  ALBUMIN  3.0*  --   --   --   --  2.2*  --  1.9* 2.0*  CRP  --   --   --   --   --   --  21.9* 19.0* 11.4*  PROCALCITON 3.33  --   --   --   --  3.08  --  1.59 0.72  LATICACIDVEN  --   --   --  0.8  --   --   --   --   --   TSH  --   --   --   --  1.230  --   --   --   --   AMMONIA  --   --  15  --   --   --  20 16 21   BNP  --   --   --   --   --   --  573.1*  --   --   MG  --   --   --   --   --   --  1.6* 2.2 1.6*  PHOS  --   --   --   --   --   --  5.2*  --   --   CALCIUM  8.8*  --   --   --   --  8.2*  --  7.9* 7.7*      Recent Labs  Lab 03/06/24 1407 03/06/24 1449 03/06/24 1454 03/06/24 1701 03/07/24 0413 03/07/24 0551 03/08/24 0133 03/09/24 0333  CRP  --   --   --   --   --  21.9* 19.0* 11.4*  PROCALCITON 3.33  --   --   --  3.08  --  1.59 0.72  LATICACIDVEN  --   --  0.8  --   --   --   --   --   TSH  --   --   --  1.230  --   --    --   --   AMMONIA  --  15  --   --   --  20 16 21   BNP  --   --   --   --   --  573.1*  --   --   MG  --   --   --   --   --  1.6* 2.2 1.6*  CALCIUM  8.8*  --   --   --  8.2*  --  7.9* 7.7*    --------------------------------------------------------------------------------------------------------------- Lab Results  Component Value Date  CHOL 118 08/14/2023   HDL 39 (L) 08/14/2023   LDLCALC 54 08/14/2023   TRIG 168 (H) 08/14/2023   CHOLHDL 3.0 08/14/2023    Lab Results  Component Value Date   HGBA1C 5.0 01/01/2024   Recent Labs    03/06/24 1701  TSH 1.230   Recent Labs    03/06/24 1702  VITAMINB12 365     Radiology Report CT ABDOMEN PELVIS WO CONTRAST Result Date: 03/07/2024 CLINICAL DATA:  Abdominal pain, acute, nonlocalized History of colovesical fistula repair, diverting ileostomy, now with AKI and hydronephrosis despite having Foley EXAM: CT ABDOMEN AND PELVIS WITHOUT CONTRAST TECHNIQUE: Multidetector CT imaging of the abdomen and pelvis was performed following the standard protocol without IV contrast. RADIATION DOSE REDUCTION: This exam was performed according to the departmental dose-optimization program which includes automated exposure control, adjustment of the mA and/or kV according to patient size and/or use of iterative reconstruction technique. COMPARISON:  Ultrasound renal 03/06/2024 FINDINGS: Lower chest: Cardiac changes suggestive of anemia. Hepatobiliary: No focal liver abnormality. No gallstones, gallbladder wall thickening, or pericholecystic fluid. No biliary dilatation. Pancreas: Diffusely atrophic. No focal lesion. Otherwise normal pancreatic contour. No surrounding inflammatory changes. No main pancreatic ductal dilatation. Spleen: Normal in size without focal abnormality. Adrenals/Urinary Tract: No adrenal nodule bilaterally. Bilateral moderate hydroureteronephrosis. No nephroureterolithiasis bilaterally. Decompressed urinary bladder with circumferential  urinary bladder wall thickening. Foley catheter in appropriate position. Gas noted within the urinary bladder lumen and left renal calyx (3:31). Stomach/Bowel: Stomach is within normal limits. No evidence of small bowel wall thickening or dilatation. Question slight wall thickening and irregularity of the rectum. Right lower quadrant diverting ileostomy noted. The appendix is not definitely identified with no inflammatory changes in the right lower quadrant to suggest acute appendicitis. Vascular/Lymphatic: No abdominal aorta or iliac aneurysm. Mild atherosclerotic plaque of the aorta and its branches. No abdominal, pelvic, or inguinal lymphadenopathy. Reproductive: Prostate is enlarged measuring up to 4.8 cm. Other: No intraperitoneal free fluid. No intraperitoneal free gas. No organized fluid collection. Musculoskeletal: No abdominal wall hernia or abnormality. No suspicious lytic or blastic osseous lesions. No acute displaced fracture. Multilevel degenerative changes of the spine. IMPRESSION: 1. Circumferential urinary bladder wall thickening with associated bilateral moderate hydroureteronephrosis. Markedly limited evaluation on this noncontrast study. Underlying malignancy or infection are not excluded. 2. Gas within the collecting system may be due to Foley catheter placement. With differential diagnosis including emphysematous pyelitis. 3. Question slight wall thickening and irregularity of the rectum. Markedly limited evaluation on this noncontrast study. Underlying proctitis not excluded. 4. Prostatomegaly. 5. Right lower quadrant diverting ileostomy. 6. Colonic diverticulosis with no acute diverticulitis. 7.  Aortic Atherosclerosis (ICD10-I70.0). Electronically Signed   By: Morgane  Naveau M.D.   On: 03/07/2024 10:47   DG Chest Port 1 View Result Date: 03/07/2024 CLINICAL DATA:  Shortness of breath. EXAM: PORTABLE CHEST 1 VIEW COMPARISON:  03/06/2024 FINDINGS: Low volume film. Cardiopericardial  silhouette is at upper limits of normal for size. Interstitial markings are diffusely coarsened with chronic features. Small nodular density in the peripheral right mid lung is stable. Subtle nodular density in the left mid lung may reflect a confluence of shadows. No dense focal airspace consolidation. No substantial pleural effusion. Skin folds are seen over the upper lungs bilaterally. Telemetry leads overlie the chest. IMPRESSION: 1. Low volume film with chronic interstitial coarsening. 2. Subtle nodular density in the left mid lung may reflect a confluence of shadows. Pneumonia or underlying soft tissue lesion not excluded. Attention on follow-up recommended.  3. Calcified granuloma again noted right lung. Electronically Signed   By: Camellia Candle M.D.   On: 03/07/2024 07:40     Signature  -   Lavada Stank M.D on 03/09/2024 at 7:11 AM   -  To page go to [email or web address]

## 2024-03-10 ENCOUNTER — Inpatient Hospital Stay (HOSPITAL_COMMUNITY)

## 2024-03-10 DIAGNOSIS — N179 Acute kidney failure, unspecified: Secondary | ICD-10-CM | POA: Diagnosis not present

## 2024-03-10 DIAGNOSIS — N1831 Chronic kidney disease, stage 3a: Secondary | ICD-10-CM | POA: Diagnosis not present

## 2024-03-10 LAB — COMPREHENSIVE METABOLIC PANEL WITH GFR
ALT: 9 U/L (ref 0–44)
AST: 19 U/L (ref 15–41)
Albumin: 2.7 g/dL — ABNORMAL LOW (ref 3.5–5.0)
Alkaline Phosphatase: 56 U/L (ref 38–126)
Anion gap: 9 (ref 5–15)
BUN: 31 mg/dL — ABNORMAL HIGH (ref 8–23)
CO2: 25 mmol/L (ref 22–32)
Calcium: 8.5 mg/dL — ABNORMAL LOW (ref 8.9–10.3)
Chloride: 104 mmol/L (ref 98–111)
Creatinine, Ser: 2.61 mg/dL — ABNORMAL HIGH (ref 0.61–1.24)
GFR, Estimated: 25 mL/min — ABNORMAL LOW (ref >60)
Glucose, Bld: 115 mg/dL — ABNORMAL HIGH (ref 70–99)
Potassium: 4.2 mmol/L (ref 3.5–5.1)
Sodium: 138 mmol/L (ref 135–145)
Total Bilirubin: 0.5 mg/dL (ref 0.0–1.2)
Total Protein: 6.5 g/dL (ref 6.5–8.1)

## 2024-03-10 LAB — CBC WITH DIFFERENTIAL/PLATELET
Abs Immature Granulocytes: 0.14 K/uL — ABNORMAL HIGH (ref 0.00–0.07)
Basophils Absolute: 0.2 K/uL — ABNORMAL HIGH (ref 0.0–0.1)
Basophils Relative: 2 %
Eosinophils Absolute: 0.2 K/uL (ref 0.0–0.5)
Eosinophils Relative: 2 %
HCT: 27.7 % — ABNORMAL LOW (ref 39.0–52.0)
Hemoglobin: 8.6 g/dL — ABNORMAL LOW (ref 13.0–17.0)
Immature Granulocytes: 1 %
Lymphocytes Relative: 19 %
Lymphs Abs: 2 K/uL (ref 0.7–4.0)
MCH: 26.3 pg (ref 26.0–34.0)
MCHC: 31 g/dL (ref 30.0–36.0)
MCV: 84.7 fL (ref 80.0–100.0)
Monocytes Absolute: 1 K/uL (ref 0.1–1.0)
Monocytes Relative: 10 %
Neutro Abs: 6.7 K/uL (ref 1.7–7.7)
Neutrophils Relative %: 66 %
Platelets: 275 K/uL (ref 150–400)
RBC: 3.27 MIL/uL — ABNORMAL LOW (ref 4.22–5.81)
RDW: 15.2 % (ref 11.5–15.5)
WBC: 10.2 K/uL (ref 4.0–10.5)
nRBC: 0 % (ref 0.0–0.2)

## 2024-03-10 LAB — CULTURE, BLOOD (ROUTINE X 2)
Culture  Setup Time: NO GROWTH
Special Requests: ADEQUATE

## 2024-03-10 LAB — MAGNESIUM: Magnesium: 2.3 mg/dL (ref 1.7–2.4)

## 2024-03-10 LAB — PREALBUMIN: Prealbumin: 12 mg/dL — ABNORMAL LOW (ref 18–38)

## 2024-03-10 LAB — PHOSPHORUS: Phosphorus: 3.3 mg/dL (ref 2.5–4.6)

## 2024-03-10 MED ORDER — MELATONIN 5 MG PO TABS
5.0000 mg | ORAL_TABLET | Freq: Every day | ORAL | Status: DC
  Administered 2024-03-10 – 2024-03-19 (×10): 5 mg via ORAL
  Filled 2024-03-10 (×10): qty 1

## 2024-03-10 MED ORDER — QUETIAPINE 12.5 MG HALF TABLET
25.0000 mg | ORAL_TABLET | Freq: Every day | ORAL | Status: DC
  Administered 2024-03-10 – 2024-03-20 (×11): 25 mg via ORAL
  Filled 2024-03-10 (×5): qty 2
  Filled 2024-03-10 (×2): qty 1
  Filled 2024-03-10: qty 2
  Filled 2024-03-10: qty 1
  Filled 2024-03-10 (×2): qty 2

## 2024-03-10 MED ORDER — CALCIUM POLYCARBOPHIL 625 MG PO TABS
625.0000 mg | ORAL_TABLET | Freq: Two times a day (BID) | ORAL | Status: DC
  Administered 2024-03-11 – 2024-03-17 (×12): 625 mg via ORAL
  Filled 2024-03-10 (×16): qty 1

## 2024-03-10 MED ORDER — LOPERAMIDE HCL 2 MG PO CAPS: 2.0000 mg | ORAL_CAPSULE | Freq: Four times a day (QID) | ORAL | Status: DC | PRN

## 2024-03-10 MED ORDER — CEFAZOLIN SODIUM-DEXTROSE 2-4 GM/100ML-% IV SOLN
2.0000 g | Freq: Two times a day (BID) | INTRAVENOUS | Status: AC
  Administered 2024-03-10 – 2024-03-14 (×7): 2 g via INTRAVENOUS
  Filled 2024-03-10 (×8): qty 100

## 2024-03-10 MED ORDER — IOHEXOL 300 MG/ML  SOLN
100.0000 mL | Freq: Once | INTRAMUSCULAR | Status: AC | PRN
  Administered 2024-03-10: 100 mL

## 2024-03-10 NOTE — TOC Progression Note (Signed)
 Transition of Care [facility]) - Progression Note    Patient Details  Name: Calvin Escobar MRN: [id number] Date of Birth: [birth year]  Transition of Care [facility]) CM/SW Contact  Inocente GORMAN Kindle, LCSW Phone Number: 03/10/2024, 8:52 AM  Clinical Narrative:    8:52 AM-Pasrr still pending review.   Insurance approval received for [facility], Ref# [id number], Auth ID# [id number], effective 03/10/2024-03/12/2024.   Expected Discharge Plan: Skilled Nursing Facility Barriers to Discharge: Awaiting State Approval THEONE)               Expected Discharge Plan and Services In-house Referral: Clinical Social Work   Post Acute Care Choice: Skilled Nursing Facility Living arrangements for the past 2 months: Single Family Home, Skilled Nursing Facility                                       Social Drivers of Health (SDOH) Interventions SDOH Screenings   Food Insecurity: Patient Unable To Answer (03/07/2024)  Housing: Unknown (03/07/2024)  Transportation Needs: Patient Unable To Answer (03/07/2024)  Utilities: Not At Risk (03/07/2024)  Alcohol Screen: Low Risk  (10/10/2023)  Depression (PHQ2-9): Low Risk  (12/09/2023)  Recent Concern: Depression (PHQ2-9) - Medium Risk (09/16/2023)  Financial Resource Strain: Low Risk  (10/10/2023)  Physical Activity: Sufficiently Active (10/10/2023)  Social Connections: Moderately Integrated (03/07/2024)  Recent Concern: Social Connections - Socially Isolated (12/11/2023)  Stress: No Stress Concern Present (10/10/2023)  Tobacco Use: Low Risk  (02/05/2024)  Health Literacy: Adequate Health Literacy (10/10/2023)    Readmission Risk Interventions    03/09/2024    3:43 PM 01/14/2024    2:09 PM 07/04/2023    1:27 PM  Readmission Risk Prevention Plan  Transportation Screening Complete  Complete  PCP or Specialist Appt within 5-7 Days   Complete  Home Care Screening   Complete  Medication Review (RN CM)   Complete  Medication Review (RN Care Manager) Complete Complete   PCP  or Specialist appointment within 3-5 days of discharge Complete Complete   HRI or Home Care Consult Complete Complete   SW Recovery Care/Counseling Consult Complete Complete   Palliative Care Screening Not Applicable Not Applicable   Skilled Nursing Facility Complete Complete

## 2024-03-10 NOTE — Plan of Care (Signed)
  Problem: Clinical Measurements: Goal: Ability to maintain clinical measurements within normal limits will improve Outcome: Progressing Goal: Respiratory complications will improve Outcome: Progressing Goal: Cardiovascular complication will be avoided Outcome: Progressing   Problem: Activity: Goal: Risk for activity intolerance will decrease Outcome: Progressing   

## 2024-03-10 NOTE — Progress Notes (Signed)
 PROGRESS NOTE                                                                                                                                                                                                             Patient Demographics:    Calvin Escobar, is a 73 y.o. male, DOB - 1951/04/02, FMW:[id number]  Outpatient Primary MD for the patient is [name], MD    LOS - 4  Admit date - 03/06/2024    Chief Complaint  Patient presents with   Hypotension   Altered Mental Status       Brief Narrative (HPI from H&P)   73 y.o. 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 He was re admitted at beginning of September this year for sepsis secondary to UTI/gram negative bacteremia and AKI on CKD stage 3a. He presents today from his SNF for AMS x 2 days.  Found again to be in AKI with metabolic encephalopathy and admitted.   Subjective:   Patient in bed mildly confused but in no distress denies any headache chest or abdominal pain, no shortness of breath.   Assessment  & Plan :    Acute kidney injury superimposed on stage 3a chronic kidney disease (HCC) with hyperkalemia, history of colovesical fistula with recent repair, diverting ileostomy, indwelling Foley.  Multiple admissions for UTI.  73 year old male presenting to ED from SNF with concerns for AMS x 2 days and hypotension found to have acute on chronic kidney injury with hyperkalemia, seen by nephrology and urology, treated with IV fluids, Lokelma  and antibiotics.     AKI hyperkalemia much improved, renal function close to baseline.  Seen by nephrology.  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  For now continue IV Rocephin  plan of total 10 days of antibiotics minimum due to complicated UTI, once stable switch to oral Keflex upon discharge to complete a total of 10 days.  Acute encephalopathy with underlying dementia- hx of dementia and  unsure of baseline, due to AKI, possible UTI and multiple sedating medications at SNF, hold offending medications, low-dose Seroquel here, nighttime melatonin, as needed Haldol  and Zyprexa .  No headache or focal deficits.  Encephalopathy mildly improved, might get even more better once he is back to his usual SNF setting.  Recurrent UTIs and possible pneumonia with Klebsiella bacteremia and UTI this admission.  See plan above n.  Changed Foley catheter on 03/07/2024, he has chronic indwelling Foley catheter which could be the source of UTI, flush Foley every shift.  Anemia of chronic disease Baseline hgb 7-8. No signs of bleeding  Iron  studies showed low iron  in 02/2024 and given IV iron  on 9/5.   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 Mild to moderate MV regurgitation. Diastolic indeterminate   Chronic indwelling Foley catheter Foley appears to be working, 100cc of urine on bladder scan Foley care, Foley catheter changed this admission  Bipolar affective disorder, depressed (HCC) Working on getting med rec reconciled  On d/c a few  weeks ago on mirtazapine , buspirone , abilify .  Hold gabapentin  with aKI   Dementia (HCC) Delirium precautions   GERD (gastroesophageal reflux disease) Continue PPI   HLD (hyperlipidemia) Continue statin   OSA per chart on BiPAP [facility] BiPAP  Chronic hyponatremia At baseline,continue to trend   Hypomagnesemia.  Replaced.      Condition - Extremely Guarded  Family Communication  : Called wife [phone] [name] on 03/07/2024 at 8:35 AM, message left, wife updated on 03/10/2024 in detail  Code Status : Full code  Consults  : Nephrology  PUD Prophylaxis : PPI   Procedures  :     CT abdomen pelvis.  1. Circumferential urinary bladder wall thickening with associated bilateral moderate hydroureteronephrosis. Markedly limited evaluation on this noncontrast study. Underlying malignancy or infection are not excluded. 2. Gas within the collecting system may be due to Foley catheter placement. With differential diagnosis including emphysematous pyelitis. 3. Question slight wall thickening and irregularity of the rectum. Markedly limited evaluation on this noncontrast study. Underlying proctitis not excluded. 4. Prostatomegaly. 5. Right lower quadrant diverting ileostomy. 6. Colonic diverticulosis with no acute diverticulitis. 7.  Aortic Atherosclerosis (ICD10-I70.0).       Disposition Plan  :    Status is: Inpatient  DVT Prophylaxis  :    heparin  injection 5,000 Units Start: 03/06/24 2200    Lab Results  Component Value Date   PLT 275 03/10/2024    Diet :  Diet Order             DIET DYS 2 Room service appropriate? Yes with Assist; Fluid consistency: Thin  Diet effective now                    Inpatient Medications  Scheduled Meds:  busPIRone   10 mg Oral TID   carvedilol   3.125 mg Oral BID WC   Chlorhexidine  Gluconate Cloth  6 each Topical Daily   ferrous sulfate   325 mg Oral BID WC   finasteride   5 mg Oral Daily   folic acid   1 mg Oral Daily   heparin   5,000 Units Subcutaneous Q8H   melatonin  5 mg Oral QHS   pantoprazole   40 mg Oral Daily   QUEtiapine  25 mg Oral Q0600   QUEtiapine  50 mg Oral QHS   tamsulosin   0.4 mg Oral Daily   Continuous Infusions:  cefTRIAXone  (ROCEPHIN )  IV 2 g (03/09/24 1746)   PRN Meds:.acetaminophen  **OR** acetaminophen , alum & mag hydroxide-simeth, haloperidol  lactate, metoprolol  tartrate, ondansetron  **OR** ondansetron  (ZOFRAN ) IV  Antibiotics  :    Anti-infectives (From admission, onward)    Start     Dose/Rate Route Frequency Ordered Stop   03/07/24 1800  cefTRIAXone  (ROCEPHIN ) 2 g in sodium chloride  0.9 % 100 mL IVPB        2 g 200 mL/hr over 30 Minutes Intravenous Every 24 hours 03/07/24 0842 03/14/24 1759   03/07/24 0930  azithromycin (ZITHROMAX) tablet 500 mg  Status:  Discontinued        500 mg Oral Daily 03/07/24 0842 03/09/24 1046   03/06/24 1800  cefTRIAXone  (ROCEPHIN ) 1 g in sodium chloride  0.9 % 100 mL IVPB  Status:  Discontinued        1 g 200 mL/hr over 30 Minutes Intravenous Every 24 hours 03/06/24 1703 03/07/24 0842         Objective:   Vitals:   03/09/24 0828 03/09/24 1954 03/10/24 0409 03/10/24 0412  BP:  129/88 128/77   Pulse: 70 80 (!) 118   Resp: (!) 23 17 18    Temp:  98.3 F (36.8 C) 98.1 F (36.7 C)   TempSrc:   Oral   SpO2:  97% 98%   Weight:    93.5 kg  Height:        Wt Readings from Last 3 Encounters:  03/10/24 93.5 kg  02/09/24 100.3 kg  01/12/24 102.7 kg     Intake/Output Summary (Last 24 hours) at 03/10/2024 0944 Last data filed at 03/10/2024 0603 Gross per 24 hour  Intake 1720 ml  Output 3325 ml  Net -1605 ml     Physical Exam  Awake, minimally confused, oriented x 2, no new F.N deficits,   [place].AT,PERRAL Supple Neck, No JVD,   Symmetrical Chest wall movement, Good air movement bilaterally, CTAB RRR,No Gallops,Rubs or new Murmurs,  +ve B.Sounds, Abd Soft, No tenderness, Foley catheter in place, ileostomy in place No Cyanosis, Clubbing or  edema       Data Review:    Recent Labs  Lab 03/06/24 1407 03/06/24 1413 03/07/24 0413 03/08/24 0133 03/09/24 0333 03/10/24 0403  WBC 6.8  --  6.7 6.7 7.3 10.2  HGB 7.7* 8.2* 8.0* 7.6* 7.6* 8.6*  HCT 26.2* 24.0* 25.7* 24.7* 24.4* 27.7*  PLT 192  --  207 231 227 275  MCV 88.5  --  86.0 84.9 84.4 84.7  MCH 26.0  --  26.8 26.1 26.3 26.3  MCHC 29.4*  --  31.1 30.8 31.1 31.0  RDW 15.3  --  15.4 15.3 15.2 15.2  LYMPHSABS 0.7  --   --  1.2 1.4 2.0  MONOABS 0.5  --   --  0.5 0.6 1.0  EOSABS 0.1  --   --  0.1 0.2 0.2  BASOSABS 0.1  --   --  0.1 0.1 0.2*    Recent Labs  Lab 03/06/24 1407 03/06/24 1413 03/06/24 1449 03/06/24 1454 03/06/24 1701 03/07/24 0413 03/07/24 0551 03/08/24 0133 03/09/24 0333 03/10/24 0403  NA 131* 129*  --   --   --  133*  --  136 133* 138  K 5.7* 5.5*  --   --   --  5.3*  --  4.5 3.9 4.2  CL 100 101  --   --   --  104  --  103 100 104  CO2 18*  --   --   --   --  16*  --  19* 23 25  ANIONGAP 13  --   --   --   --  13  --  14 10 9   GLUCOSE 96 100*  --   --   --  89  --  112* 109* 115*  BUN 71* 72*  --   --   --  68*  --  60* 42* 31*  CREATININE 5.49* 6.30*  --   --   --  5.52*  --  4.27* 2.98* 2.61*  AST 17  --   --   --   --  11*  --  <10* 11* 19  ALT 8  --   --   --   --  9  --  8 9 9   ALKPHOS 87  --   --   --   --  66  --  53 54 56  BILITOT 0.2  --   --   --   --  0.6  --  0.5 0.5 0.5  ALBUMIN  3.0*  --   --   --   --  2.2*  --  1.9* 2.0* 2.7*  CRP  --   --   --   --   --   --  21.9* 19.0* 11.4*  --   PROCALCITON 3.33  --   --   --   --  3.08  --  1.59 0.72  --   LATICACIDVEN  --   --   --  0.8  --   --   --   --   --   --   TSH  --   --   --   --  1.230  --   --   --   --   --   AMMONIA  --   --  15  --   --   --  20 16 21   --   BNP  --   --   --   --   --   --  573.1*  --   --   --   MG  --   --   --   --   --   --  1.6* 2.2 1.6* 2.3  PHOS  --   --   --   --   --   --  5.2*  --   --   --   CALCIUM  8.8*  --   --   --   --  8.2*  --  7.9*  7.7* 8.5*      Recent Labs  Lab 03/06/24 1407 03/06/24 1449 03/06/24 1454 03/06/24 1701 03/07/24 0413 03/07/24 0551 03/08/24 0133 03/09/24 0333 03/10/24 0403  CRP  --   --   --   --   --  21.9* 19.0* 11.4*  --   PROCALCITON 3.33  --   --   --  3.08  --  1.59 0.72  --   LATICACIDVEN  --   --  0.8  --   --   --   --   --   --   TSH  --   --   --  1.230  --   --   --   --   --   AMMONIA  --  15  --   --   --  20 16 21   --   BNP  --   --   --   --   --  573.1*  --   --   --   MG  --   --   --   --   --  1.6* 2.2 1.6* 2.3  CALCIUM  8.8*  --   --   --  8.2*  --  7.9* 7.7* 8.5*    --------------------------------------------------------------------------------------------------------------- Lab Results  Component Value Date   CHOL 118 08/14/2023   HDL 39 (L) 08/14/2023   LDLCALC 54 08/14/2023   TRIG 168 (H) 08/14/2023   CHOLHDL 3.0 08/14/2023    Lab Results  Component Value Date   HGBA1C 5.0 01/01/2024   No results for input(s): TSH, T4TOTAL, FREET4, T3FREE, THYROIDAB in the last 72 hours.  No results for input(s): VITAMINB12, FOLATE, FERRITIN, TIBC, IRON , RETICCTPCT in the last 72 hours.    Radiology Report DG Abd Portable 1V Result Date: 03/09/2024 CLINICAL DATA:  Constipation. EXAM: DG ABD PORTABLE 1V COMPARISON:  None Available. FINDINGS: Right lower quadrant ostomy. Scattered gas-filled large and small bowel. No large or small bowel distention. No radiopaque stones. Degenerative changes in the spine and hips. Calcified phleboliths in the pelvis. Vascular calcifications. Lung bases are clear. IMPRESSION: Normal nonobstructive bowel gas pattern. Right lower quadrant ostomy. Electronically Signed   By: Elsie Gravely M.D.   On: 03/09/2024 15:58     Signature  -   Lavada Stank M.D on 03/10/2024 at 9:44 AM   -  To page go to [email or web address]

## 2024-03-10 NOTE — Progress Notes (Signed)
 PT does not wear BIPAP.

## 2024-03-10 NOTE — Progress Notes (Addendum)
 Patient became more agitated pulling things and removing his ostomy and threw it everywhere.  Virtual patient observation has been initiated and in use but patient was not following command and not redirectable and they threaten to take away the monitor. He was relocated from room 3 to room 13 to be near nurses station yet it was difficult to keep him safe in bed. MD was notified and an order for restraint received.  Hw is in bed agitated and confused.  Will continue to monitor.

## 2024-03-10 NOTE — Progress Notes (Signed)
 Patient familiar to [facility] surgery given history of colovesical fistula with severe urinary problems.  Underwent robotic rectosigmoid resection with takedown of colovesical fistula 2 months ago.  I had to do a temporary diverting loop ileostomy to protect the colorectal anastomosis given the inflamed tissue and fragile state.      Post-Op  01/09/2024   POST-OPERATIVE DIAGNOSIS:   COLOVESICAL FISTULA WITH ABSCESS RECTOSIGMOID STRICTURE   PROCEDURE:   -ROBOTIC LOW ANTERIOR RECTOSIGMOID RESECTION (LAR) WITH ANASTOMOSIS -COLOVESICAL FISTULA TAKEDOWN & REPAIR -DIVERTING LOOP ILEOSTOMY (DLI),  =DRAINAGE OF PELVIC ABSCESS -LYSIS OF ADHESIONS x45 MINUTES (30% OF CASE) -INTRAOPERATIVE ASSESSMENT OF TISSUE VASCULAR PERFUSION USING ICG (indocyanine green ) IMMUNOFLUORESCENCE -TRANSVERSUS ABDOMINIS PLANE (TAP) BLOCK - BILATERAL -FLEXIBLE SIGMOIDOSCOPY   SURGEON:  Elspeth KYM Schultze, MD   OR FINDINGS:   Patient had very thickened rectosigmoid colon folded and twisted upon itself with stricturing.  Extremely dense adhesions to the left dome of the bladder with 4 x 3 x 3 cm abscess.  Abscess decompressed.  No active persistent opening of the bladder on insufflation.   No obvious metastatic disease on visceral parietal peritoneum or liver.   It is a 31mm EEA anastomosis ( distal descending colon  connected to proximal rectum.)  It rests 17 cm from the anal verge by flexible sigmoidoscopy  Our office has been trying to check and see what has been going on with him since he has been lost to follow-up since his discharge.  He is overdue to see me in the office.  Family not answering.   Upon chart checking today, [facility] office noted patient had been readmitted.  I suspect he is getting worsening kidney problems due to his ileostomy.  He is at risk and has had high output.  Challenge with him since he is not the most compliant of people.  I again strong recommend fiber twice daily, iron  twice daily, antidiarrheals as  needed.  Use that to thicken and constipate his small bowel effluent so that he has less than 1000 mL out a day and is less risky to get dehydration and worsening kidney disease.  Strict I's and O's to help follow that.  He is due for an enema to see if his colorectal anastomosis has healed.  CT done this admission does not show any evidence of any inflammation or leak there but wished to be thorough/safe.  Since he is a challenge to manage in the outpatient setting and he is not due for the study, I ordered a CT scan with rectal contrast only now to evaluate and make sure the colorectal anastomosis shows no leak.  There is no leak, then I can consider proceeding with diverting loop ileostomy takedown.  Hopefully that one help get his colon connected and make his less vulnerable to dehydration and worsening kidney flare/readmissions.    Need to check nutrition labs   Defer to urology of the Foley.  He is keeps getting recurrent UTIs but he is very vulnerable and still with significant bladder wall thickening and some hydronephrosis, albeit slowly improved.  Sounds like they were thinking about doing a CT cystogram as well to make sure there is no bladder leak.  Defer to them if they want to order that as well.  I will follow-up and see the patient once the rectal contrast CT is done  Elspeth KYM Schultze, MD, FACS, MASCRS Esophageal, Gastrointestinal & Colorectal Surgery Robotic and Minimally Invasive Surgery  [facility] [address], [address] [place], [place] [address] [phone] Fax [phone] Main  CONTACT INFORMATION: Weekday (9AM-5PM): Call [facility] main office at [phone] Weeknight (5PM-9AM) or Weekend/Holiday: Check EPIC Web Links tab & use AMION (password  [email or web address]) for General Surgery [facility] coverage  Please, DO NOT use SecureChat  (it is not reliable communication to reach operating surgeons & will lead to a delay in care).    Epic staff messaging available for outptient concerns needing 1-2 business day response.

## 2024-03-10 NOTE — Progress Notes (Signed)
 [facility] Progress Note  Subjective:  Pt seen in room, sleeping Per RN who had him yesterday as well lots of agitation yesterday and overnight, pt just finally got to sleep for 1st time in 2 days Good oral intake 3.2L UOP yesterday   Vitals:   03/09/24 0828 03/09/24 1954 03/10/24 0409 03/10/24 0412  BP:  129/88 128/77   Pulse: 70 80 (!) 118   Resp: (!) 23 17 18    Temp:  98.3 F (36.8 C) 98.1 F (36.7 C)   TempSrc:   Oral   SpO2:  97% 98%   Weight:    93.5 kg  Height:        Exam: General: elderly WM, sleeping CV: regular rate Lungs: normal work of breathing Abd: soft, non-tender, non-distended; +ostomy bag +Foley with clear yellow urine    Home bp meds:  Coreg  6.25 bid Sodium bicarb 1300mg  bid Others: abilify , buspar , neurontin , norco, imodium , PPI, statin, primidone , flomax , effexor      Date   Creat  eGFR (ml/min) 2015- aug 2024 0.80- 1.24 May 2023  2.04 >> 1.13  AKI Jan 2025  1.52 >> 1.20   AKI  Feb 2025  1.44 >> 0.96   AKI Mar- April 2025 1.16- 1.29 May- June 2025 1.52- 1.89 July 2025  1.41- 2.75  Aug 2025  1.27- 1.66 43- 59 ml/min Sept 2025  3.16 >> 1.49   20 - 49 ml/min 10/03   5.49, 6.30 10  10/04   5.52  10  Renal US : 12.1/ .11.8 cm kidneys w/ mod-severe hydro on R and mild hydro on L UA 10/3 - mod Hb, mod LE, 100 prot, many bact, rbc/ wbc > 50, epis 0-5 UNa 38, UCr 79    Assessment/ Plan: AKI on CKD 3a: b/l creatinine 1.3- 1.7 from aug 2025, eGFR 43- 59 ml/min. Peak creat here was 6.3 on admission in the setting of AMS and gen'd weakness. Pt has chronic foley and colostomy bag, hx of UTIs. Hypotensive on arrival BP 96/70.  Possible recurrence of prior high-output ostomy causing vol depletion. FeNa 2% indeterminate.  Renal US  showed similar appearing bilateral hydronephrosis, moderate to severe on the right & urinary bladder wall trabeculations likely from chronic bladder outlet obstruction. From imaging, longstanding hydronephrosis  looks stable. Suspecting AKI from primarily vol depletion, +/- other. Creatinine continues to improve down to 2.6 today. We stopped IVF 10/6 and his electrolytes remains stable with continued improvement in kidney function - PO intake adequate. Borderline polyuria:  denies h/o lithium  use, no h/o hypernatremia, recent urine SG 1.025last month- suspect just a post AKI diuresis.  Electrolytes remain normal.   Hx of colovesical fistula: w/ recent repair, diverting ileostomy, indwelling Foley H/o UTI's HFrEF: last EF 25-30% Chronic indwelling foley cath Depression/ bipolar d/o: on numerous psych medications Dementia  Nothing further to add, ok for d/c.  Would check labs in 2 weeks at SNF. If kidney function isn't at his baseline he can be referred to see me in clinic --> I suspect he'll return to baseline and I don't know how much value is to be gained coming to visits with me if kidney function is stable.   Manuelita Barters MD [facility] Pager [phone]   Recent Labs  Lab 03/07/24 [phone] 03/08/24 0133 03/09/24 0333 03/10/24 0403  HGB  --    < > 7.6* 8.6*  ALBUMIN   --    < > 2.0* 2.7*  CALCIUM   --    < > 7.7*  8.5*  PHOS 5.2*  --   --   --   CREATININE  --    < > 2.98* 2.61*  K  --    < > 3.9 4.2   < > = values in this interval not displayed.   No results for input(s): IRON , TIBC, FERRITIN in the last 168 hours. Inpatient medications:  busPIRone   10 mg Oral TID   carvedilol   3.125 mg Oral BID WC   Chlorhexidine  Gluconate Cloth  6 each Topical Daily   ferrous sulfate   325 mg Oral BID WC   finasteride   5 mg Oral Daily   folic acid   1 mg Oral Daily   heparin   5,000 Units Subcutaneous Q8H   melatonin  5 mg Oral QHS   pantoprazole   40 mg Oral Daily   QUEtiapine  25 mg Oral Q0600   QUEtiapine  50 mg Oral QHS   tamsulosin   0.4 mg Oral Daily    cefTRIAXone  (ROCEPHIN )  IV 2 g (03/09/24 1746)   acetaminophen  **OR** acetaminophen , alum & mag hydroxide-simeth,  haloperidol  lactate, metoprolol  tartrate, ondansetron  **OR** ondansetron  (ZOFRAN ) IV

## 2024-03-10 NOTE — Progress Notes (Signed)
 Physical Therapy Treatment Patient Details Name: Calvin Escobar MRN: [id number] DOB: 12-20-1950 Today's Date: 03/10/2024   History of Present Illness 73 y.o. male presents to [facility] from SNF with AMS and hypotension. Admitted with AKI w/ hyperkalemia, UTI, and acute encephalopathy.  PMHx: CKD stage 3a, hx of VRE, HTN, HLD, OSA on cpap, ACD, dementia, bipolar, anxiety and depression, chronic indwelling foley catheter, non ischemic CM with HFrEF, colovesical fistula/sigmoid diveritculitis, dementia    PT Comments  Pt with fair tolerance to treatment today. Pt today was able to progress gait training short distance in room. Pt noted with downward gaze preference and posterior lean during ambulation with cues for upright posture. Pt mostly shuffling and requiring Min/Mod A for obstacle navigation. Distance limited by pt reporting dizziness and requesting to return to bed. No change in DC/DME recs at this time. PT will continue to follow.     If plan is discharge home, recommend the following: A lot of help with walking and/or transfers;A lot of help with bathing/dressing/bathroom;Assistance with cooking/housework;Direct supervision/assist for medications management;Direct supervision/assist for financial management;Assist for transportation;Help with stairs or ramp for entrance   Can travel by private vehicle     No  Equipment Recommendations  Other (comment) (Defer to facility)    Recommendations for Other Services       Precautions / Restrictions Precautions Precautions: Fall Recall of Precautions/Restrictions: Impaired Restrictions Weight Bearing Restrictions Per Provider Order: No     Mobility  Bed Mobility Overal bed mobility: Needs Assistance Bed Mobility: Supine to Sit, Sit to Supine     Supine to sit: +2 for physical assistance, Mod assist Sit to supine: +2 for physical assistance, Max assist   General bed mobility comments: Assistance with trunk elevation. Helicopter  method to return to bed.    Transfers Overall transfer level: Needs assistance Equipment used: Rolling walker (2 wheels) Transfers: Sit to/from Stand Sit to Stand: Min assist, +2 safety/equipment, +2 physical assistance           General transfer comment: Min A to stand multiple times. Cues for hand placement however pt still pulling up on RW.    Ambulation/Gait Ambulation/Gait assistance: +2 physical assistance, Min assist, Mod assist, +2 safety/equipment Gait Distance (Feet): 20 Feet Assistive device: Rolling walker (2 wheels) Gait Pattern/deviations: Leaning posteriorly, Shuffle, Decreased stride length, Step-through pattern Gait velocity: decreased     General Gait Details: Pt noted with downward gaze preference and posterior lean during ambulation with cues for upright posture. Pt mostly shuffling and requiring Min/Mod A for obstacle navigation. Distance limited by pt reporting dizziness and requesting to return to bed.   Stairs             Wheelchair Mobility     Tilt Bed    Modified Rankin (Stroke Patients Only)       Balance Overall balance assessment: Needs assistance, Mild deficits observed, not formally tested Sitting-balance support: Bilateral upper extremity supported, Feet supported Sitting balance-Leahy Scale: Poor Sitting balance - Comments: reliant on UE support and CGA/MinA Postural control: Posterior lean Standing balance support: Bilateral upper extremity supported, During functional activity, Reliant on assistive device for balance Standing balance-Leahy Scale: Poor Standing balance comment: posterior lean with assist to correct balance                            Communication Communication Communication: No apparent difficulties  Cognition Arousal: Alert Behavior During Therapy: Flat affect   PT -  Cognitive impairments: Orientation, History of cognitive impairments, Awareness, Memory, Attention, Sequencing, Problem  solving, Safety/Judgement   Orientation impairments: Place, Time, Situation                   PT - Cognition Comments: hx dementia, very flat affect today. Following commands: Impaired Following commands impaired: Only follows one step commands consistently, Follows one step commands with increased time, Follows multi-step commands inconsistently    Cueing Cueing Techniques: Verbal cues, Tactile cues  Exercises      General Comments General comments (skin integrity, edema, etc.): VSS      Pertinent Vitals/Pain Pain Assessment Pain Assessment: Faces Faces Pain Scale: No hurt    Home Living                          Prior Function            PT Goals (current goals can now be found in the care plan section) Progress towards PT goals: Progressing toward goals    Frequency    Min 1X/week      PT Plan      Co-evaluation              AM-PAC PT 6 Clicks Mobility   Outcome Measure  Help needed turning from your back to your side while in a flat bed without using bedrails?: A Lot Help needed moving from lying on your back to sitting on the side of a flat bed without using bedrails?: A Lot Help needed moving to and from a bed to a chair (including a wheelchair)?: A Lot Help needed standing up from a chair using your arms (e.g., wheelchair or bedside chair)?: A Little Help needed to walk in hospital room?: A Lot Help needed climbing 3-5 steps with a railing? : Total 6 Click Score: 12    End of Session Equipment Utilized During Treatment: Gait belt Activity Tolerance: Patient limited by fatigue Patient left: in bed;with call bell/phone within reach;with bed alarm set;with restraints reapplied Nurse Communication: Mobility status PT Visit Diagnosis: Unsteadiness on feet (R26.81);Other abnormalities of gait and mobility (R26.89);Muscle weakness (generalized) (M62.81)     Time: 8664-8643 PT Time Calculation (min) (ACUTE ONLY): 21  min  Charges:    $Gait Training: 8-22 mins PT General Charges $$ ACUTE PT VISIT: 1 Visit                     Talitha Dicarlo B, PT, DPT Acute Rehab Services [phone]    Calvin Escobar 03/10/2024, 3:35 PM

## 2024-03-11 DIAGNOSIS — N179 Acute kidney failure, unspecified: Secondary | ICD-10-CM | POA: Diagnosis not present

## 2024-03-11 DIAGNOSIS — N1831 Chronic kidney disease, stage 3a: Secondary | ICD-10-CM | POA: Diagnosis not present

## 2024-03-11 LAB — CBC WITH DIFFERENTIAL/PLATELET
Abs Immature Granulocytes: 0.13 K/uL — ABNORMAL HIGH (ref 0.00–0.07)
Basophils Absolute: 0.2 K/uL — ABNORMAL HIGH (ref 0.0–0.1)
Basophils Relative: 2 %
Eosinophils Absolute: 0.2 K/uL (ref 0.0–0.5)
Eosinophils Relative: 2 %
HCT: 28.1 % — ABNORMAL LOW (ref 39.0–52.0)
Hemoglobin: 8.6 g/dL — ABNORMAL LOW (ref 13.0–17.0)
Immature Granulocytes: 1 %
Lymphocytes Relative: 22 %
Lymphs Abs: 2 K/uL (ref 0.7–4.0)
MCH: 26.6 pg (ref 26.0–34.0)
MCHC: 30.6 g/dL (ref 30.0–36.0)
MCV: 87 fL (ref 80.0–100.0)
Monocytes Absolute: 0.7 K/uL (ref 0.1–1.0)
Monocytes Relative: 8 %
Neutro Abs: 5.9 K/uL (ref 1.7–7.7)
Neutrophils Relative %: 65 %
Platelets: 245 K/uL (ref 150–400)
RBC: 3.23 MIL/uL — ABNORMAL LOW (ref 4.22–5.81)
RDW: 15.2 % (ref 11.5–15.5)
WBC: 9.2 K/uL (ref 4.0–10.5)
nRBC: 0 % (ref 0.0–0.2)

## 2024-03-11 LAB — COMPREHENSIVE METABOLIC PANEL WITH GFR
ALT: 9 U/L (ref 0–44)
AST: 18 U/L (ref 15–41)
Albumin: 2.5 g/dL — ABNORMAL LOW (ref 3.5–5.0)
Alkaline Phosphatase: 58 U/L (ref 38–126)
Anion gap: 13 (ref 5–15)
BUN: 25 mg/dL — ABNORMAL HIGH (ref 8–23)
CO2: 24 mmol/L (ref 22–32)
Calcium: 8.5 mg/dL — ABNORMAL LOW (ref 8.9–10.3)
Chloride: 102 mmol/L (ref 98–111)
Creatinine, Ser: 2.4 mg/dL — ABNORMAL HIGH (ref 0.61–1.24)
GFR, Estimated: 27 mL/min — ABNORMAL LOW (ref >60)
Glucose, Bld: 107 mg/dL — ABNORMAL HIGH (ref 70–99)
Potassium: 4.4 mmol/L (ref 3.5–5.1)
Sodium: 139 mmol/L (ref 135–145)
Total Bilirubin: 0.5 mg/dL (ref 0.0–1.2)
Total Protein: 6.1 g/dL — ABNORMAL LOW (ref 6.5–8.1)

## 2024-03-11 LAB — CULTURE, BLOOD (ROUTINE X 2)
Culture: NO GROWTH
Special Requests: ADEQUATE

## 2024-03-11 LAB — MAGNESIUM: Magnesium: 2.1 mg/dL (ref 1.7–2.4)

## 2024-03-11 MED ORDER — ALVIMOPAN 12 MG PO CAPS
12.0000 mg | ORAL_CAPSULE | ORAL | Status: DC
Start: 2024-03-12 — End: 2024-03-13
  Filled 2024-03-11: qty 1

## 2024-03-11 MED ORDER — CHLORHEXIDINE GLUCONATE CLOTH 2 % EX PADS
6.0000 | MEDICATED_PAD | Freq: Once | CUTANEOUS | Status: AC
  Administered 2024-03-11: 6 via TOPICAL

## 2024-03-11 MED ORDER — CHLORHEXIDINE GLUCONATE CLOTH 2 % EX PADS: 6.0000 | MEDICATED_PAD | Freq: Once | CUTANEOUS | Status: DC

## 2024-03-11 MED ORDER — GABAPENTIN 100 MG PO CAPS: 200.0000 mg | ORAL_CAPSULE | ORAL | Status: DC

## 2024-03-11 MED ORDER — CEFOTETAN DISODIUM 2 G IJ SOLR
2.0000 g | INTRAMUSCULAR | Status: AC
  Administered 2024-03-12: 2 g via INTRAVENOUS
  Filled 2024-03-11: qty 2

## 2024-03-11 MED ORDER — ACETAMINOPHEN 500 MG PO TABS: 1000.0000 mg | ORAL_TABLET | ORAL | Status: DC

## 2024-03-11 MED ORDER — ENSURE PRE-SURGERY PO LIQD
592.0000 mL | Freq: Once | ORAL | Status: AC
  Administered 2024-03-11: 592 mL via ORAL
  Filled 2024-03-11 (×2): qty 592

## 2024-03-11 MED ORDER — ENSURE PRE-SURGERY PO LIQD
296.0000 mL | Freq: Once | ORAL | Status: AC
  Administered 2024-03-12: 296 mL via ORAL
  Filled 2024-03-11 (×2): qty 296

## 2024-03-11 NOTE — Progress Notes (Signed)
 After discussing with the patient at the bedside, I called and also discussed with patient's wife.  I think it may be wise to consider loop ileostomy takedown while he is here.  He is 8 weeks out and there is no evidence of a leak.  There are a lot of problems with him pulling the ostomy bag off with difficult challenges with hygiene.  Disruptive for him.  He is getting dehydration with high output ileostomy which is making his chronic kidney disease worse and recurrent AKI/dehydration.  He remains struggling with some confusion in the setting of some dementia, but she notes he has had some decent quality of life at home and they wish to be aggressive.    I did caution there are risks, but this is a shorter case and less risky case given the greater route about a procedure 2 months ago.  Less operative risks overall.  Again reiterated that helpful to get rid of the loop ileostomy in the hopes that he is less vulnerable to dehydration and helping with hygiene issues.  Patient's wife wants to be aggressive and proceed.  I  have availability to do it tomorrow afternoon ~2:30pm Thursday 10/9 & in the past the office to try and set it up.  Try to avoid the emergency surgery room if possible.  They are working on trying to post it.  The anatomy & physiology of the digestive tract was discussed.  The pathophysiology was discussed.  Possibility of remaining with an ostomy permanently was discussed.  I offered ostomy takedown.  Laparoscopic & open techniques were discussed.   Risks such as bleeding, infection, abscess, leak, reoperation, possible re-ostomy, injury to other organs, need for repair of tissues / organs, need for further treatment, hernia, heart attack, death, and other risks were discussed.   I noted a good likelihood this will help address the problem.  Goals of post-operative recovery were discussed as well.  We will work to minimize complications.  Questions were answered.  The patient expresses  understanding & wishes to proceed with surgery.   Discussed with internal medicine primary service hospitalist, Dr. Samtani, who is coming on to assume care of the patient this coming week.   He is going to double check on the patient but does not think there is any hard contraindications to proceeding with surgery.  He agrees with hopeful benefits of trying to break the cycle of dehydration and the challenges of ostomy care.  If he has concerns he will let me know.  Elspeth KYM Schultze, MD, FACS, MASCRS Esophageal, Gastrointestinal & Colorectal Surgery Robotic and Minimally Invasive Surgery  [facility] [address] [place], [place] [address] [phone] Fax [phone] Main  CONTACT INFORMATION: Weekday (9AM-5PM): Call [facility] main office at [phone] Weeknight (5PM-9AM) or Weekend/Holiday: Check EPIC Web Links tab & use AMION (password  [email or web address]) for General Surgery [facility] coverage  Please, DO NOT use SecureChat  (it is not reliable communication to reach operating surgeons & will lead to a delay in care).   Epic staff messaging available for outpatient concerns needing 1-2 business day response.

## 2024-03-11 NOTE — Plan of Care (Signed)
  Problem: Coping: Goal: Level of anxiety will decrease Outcome: Not Progressing   

## 2024-03-11 NOTE — Progress Notes (Signed)
 Assumed care 1900-0700.   Patient awake entire shift and extremely restless. Scheduled and PRN medications given, see eMAR. Patient attempting to get out of bed consistently so lap belt was attempted and failed. Patient in soft wrist restraints. Patient continuously pulling off tele leads and ileostomy. Foley still in place. Alert to self. Denies pain.

## 2024-03-11 NOTE — H&P (View-Only) (Signed)
 03/11/2024  Calvin Escobar [id number] [birth year]  CARE TEAM: PCP: [name], MD  Outpatient Care Team: Patient Care Team: [name], MD as PCP - General (Internal Medicine) [name], MD as PCP - Cardiology (Cardiology) [name], MD (Dermatology) [facility] (Behavioral Health) [facility] (Psychiatry) [name], [name] LABOR, MD (Psychiatry) Legrand Victory LITTIE MOULD, MD as Consulting Physician (Gastroenterology) Sheldon Standing, MD as Consulting Physician (Colon and Rectal Surgery) [name], MD as Consulting Physician (Urology) Daneen Damien BROCKS, NP as Nurse Practitioner (Cardiology)  Inpatient Treatment Team: Treatment Team:  Samtani, Jai-Gurmukh, MD Bobbette Lights, MD Shane Steffan BROCKS, MD Sheldon Standing, MD Boston Morrison, RN Fermin Fordyce, RN Apickup-Ot, A, OT Mikki Margarie BRAVO, OT Estelle Hunter DEL, RN Mila Johnston HERO, RN Lurena Luana RAMAN, NT Peri Ozella BROCKS, [place]   Problem List:   Principal Problem:   Acute kidney injury superimposed on stage 3a chronic kidney disease [facility]) with hyperkalemia Active Problems:   Dementia (HCC)   Ileostomy in place [facility])   Bipolar affective disorder, depressed (HCC)   Chronic HFrEF (heart failure with reduced ejection fraction) (HCC)   HLD (hyperlipidemia)   Hyperkalemia   Morbid obesity (HCC)   Hypertension   Chronic kidney disease, stage 3a (HCC)   Anemia of chronic disease   Recurrent UTI   GERD (gastroesophageal reflux disease)   Hydronephrosis, right   History of urinary retention   Chronic indwelling Foley catheter   Malnutrition of moderate degree   OSA on CPAP   Diverticulitis of sigmoid colon s/p robotic colectomy 01/09/2024   Acute encephalopathy   Chronic hyponatremia   01/09/2024  POST-OPERATIVE DIAGNOSIS:   COLOVESICAL FISTULA WITH ABSCESS RECTOSIGMOID STRICTURE   PROCEDURE:   -ROBOTIC LOW ANTERIOR RECTOSIGMOID RESECTION (LAR) WITH  ANASTOMOSIS -COLOVESICAL FISTULA TAKEDOWN & REPAIR -DIVERTING LOOP ILEOSTOMY (DLI),  =DRAINAGE OF PELVIC ABSCESS -LYSIS OF ADHESIONS x45 MINUTES (30% OF CASE) -INTRAOPERATIVE ASSESSMENT OF TISSUE VASCULAR PERFUSION USING ICG (indocyanine green ) IMMUNOFLUORESCENCE -TRANSVERSUS ABDOMINIS PLANE (TAP) BLOCK - BILATERAL -FLEXIBLE SIGMOIDOSCOPY   SURGEON:  [name], MD  OR FINDINGS:   Patient had very thickened rectosigmoid colon folded and twisted upon itself with stricturing.  Extremely dense adhesions to the left dome of the bladder with 4 x 3 x 3 cm abscess.  Abscess decompressed.  No active persistent opening of the bladder on insufflation.   No obvious metastatic disease on visceral parietal peritoneum or liver.   It is a 31mm EEA anastomosis ( distal descending colon  connected to proximal rectum.)  It rests 17 cm from the anal verge by flexible sigmoidoscopy    Assessment [facility] Stay = 5 days)      Guarded but stable   Plan:  The patient is 2 months out from his rectosigmoid resection with takedown and repair of his colovesical fistula.   Contrast enema is reassuring that there is no colorectal anastomotic leak nor any recurrent colovesical fistula.  CT cystogram also reassuring that there is no evidence of any bladder leak from that side either.  Less hydronephrosis.  They suspect chronic bladder outlet problems and therefore keep Foley catheter even longer.  Defer to urology.  I believe they are pending outpatient follow-up.  Standard of care is to consider loop ileostomy takedown at some point.  Sometimes we do it early as 6 weeks.  Usually wait 3 months.  It is been 2 months.  Because he has bounced back with probable high output ileostomy  leading to dehydration AKI and worsening CKD, may be reasonable to move it up sooner since he is outside the window of the leak and there is no proof of 1.  He also keeps pulling it off.  The loop ileostomy takedown is a much simpler  operation and less risky than the bigger robotic resection done 2 months ago.  Risk of leak with small bowel anastomosis much less.  That would help look back his colon up and hopefully avoid less risk of dehydration.  However, he is confused and deconditioned.  No easy answers here.  He would be helpful to get a sense if he is cleared from a medical standpoint.  He has intermittent confusion has been chronic.  I do not know if it is worse.  I do not know if delaying things another month for him to get stronger would help versus the risk of readmission.  I do not have any time right now to do it but I could try and schedule electively in the next month.    -Solid diet as tolerated.  Apparently he is a very picky eater.  Will see if nutrition has other options to help.  I suspect he could benefit from supplemental shakes but he tends to avoid them as well.    mobilize as tolerated to help recovery.  Enlist therapies in moderate/high risk patients as appropriate.  Physical therapy and Occupational Therapy ordered.    I updated the patient's status to the patient patient's wife was at bedside so was able to update and give a long discussion about surgical interventions, reasoning, postoperative recommendations and plans.  Recommendations were made.  Questions were answered.  He expressed some understanding & appreciation.  -Disposition: TBD    I reviewed nursing notes, hospitalist notes, last 24 h vitals and pain scores, last 48 h intake and output, last 24 h labs and trends, and last 24 h imaging results.  I have reviewed this patient's available data, including medical history, events of note, test results, etc as part of my evaluation.   A significant portion of that time was spent in counseling. Care during the described time interval was provided by me.  This care required moderate level of medical decision making.  03/11/2024    Subjective: (Chief complaint)  Noticed patient  readmitted on chart check.  Contrast enema is done as well as CT cystogram.  No evidence of any bladder or colorectal anastomotic leak.  No evidence of any recurrent colovesical fistula.  Patient in bed.  Intermittently confused.  Some soft wrist restraints.  Denies any nausea or vomiting.  He is interested in getting his diverting loop ileostomy down at some point.  Objective:  Vital signs:  Vitals:   03/10/24 2000 03/10/24 2333 03/11/24 0426 03/11/24 0500  BP: (!) 140/104 139/86 (!) 140/82   Pulse:  97 86   Resp: 15  18   Temp: 98 F (36.7 C) 98.2 F (36.8 C) 98 F (36.7 C)   TempSrc: Oral Oral Oral   SpO2:  100% 98%   Weight:    90.6 kg  Height:        Last BM Date : 03/11/24  Intake/Output   Yesterday:  10/07 0701 - 10/08 0700 In: 960 [P.O.:960] Out: 850 [Urine:800; Stool:50] This shift:  No intake/output data recorded.  Bowel function:  Flatus: YES  BM:  YES - thin succuss  Drain: (No drain)   Physical Exam:  General: Pt sleeping but awakens to be  alert in no acute distress Eyes: PERRL, normal EOM.  Sclera clear.  No icterus Neuro: CN II-XII intact w/o focal sensory/motor deficits. Lymph: No head/neck/groin lymphadenopathy Psych: Infusion but no agitation.  No delerium/psychosis/paranoia.  Oriented x 2-4 HENT: Normocephalic, Mucus membranes moist.  No thrush Neck: Supple, No tracheal deviation.  No obvious thyromegaly Chest: No pain to chest wall compression.  Good respiratory excursion.  No audible wheezing CV:  Pulses intact.  Regular rhythm.  No major extremity edema MS: Normal AROM mjr joints.  No obvious deformity  Abdomen: Soft.  Nondistended.  Nontender.  No evidence of peritonitis.  No incarcerated hernias. Right infraumbilical paramedian diverting loop ileostomy in place.  Pink with Play-Doh consistency effluent in bag   Ext:   No deformity.  No mjr edema.  No cyanosis Skin: No petechiae / purpurea.  No major sores.  Warm and  dry    Results:   Cultures: Recent Results (from the past 720 hours)  Culture, blood (Routine X 2) w Reflex to ID Panel     Status: Abnormal   Collection Time: 03/06/24  1:26 PM   Specimen: Right Antecubital; Blood  Result Value Ref Range Status   Specimen Description   Final    RIGHT ANTECUBITAL Performed at [facility], [address] [place], [place] [address]    Special Requests   Final    BOTTLES DRAWN AEROBIC AND ANAEROBIC Blood Culture adequate volume Performed at [facility], [address], [place], [place] [address]    Culture  Setup Time   Final    GRAM NEGATIVE RODS IN BOTH AEROBIC AND ANAEROBIC BOTTLES CRITICAL RESULT CALLED TO, READ BACK BY AND VERIFIED WITH: PHARMD G ABBOTT 03/08/2024 @ 0305 BY AB Performed at [facility], [address], [place], [place] [address]    Culture KLEBSIELLA PNEUMONIAE (A)  Final   Report Status 03/10/2024 FINAL  Final   Organism ID, Bacteria KLEBSIELLA PNEUMONIAE  Final      Susceptibility   Klebsiella pneumoniae - MIC*    AMPICILLIN  RESISTANT Resistant     CEFAZOLIN (NON-URINE) 2 SENSITIVE Sensitive     CEFEPIME  <=0.12 SENSITIVE Sensitive     ERTAPENEM <=0.12 SENSITIVE Sensitive     CEFTRIAXONE  <=0.25 SENSITIVE Sensitive     CIPROFLOXACIN <=0.06 SENSITIVE Sensitive     GENTAMICIN <=1 SENSITIVE Sensitive     MEROPENEM <=0.25 SENSITIVE Sensitive     TRIMETH/SULFA <=20 SENSITIVE Sensitive     AMPICILLIN /SULBACTAM 4 SENSITIVE Sensitive     PIP/TAZO Value in next row Sensitive      <=4 SENSITIVEThis is a modified FDA-approved test that has been validated and its performance characteristics determined by the reporting laboratory.  This laboratory is certified under the Clinical Laboratory Improvement Amendments CLIA as qualified to perform high complexity clinical laboratory testing.    * KLEBSIELLA PNEUMONIAE  Blood Culture ID Panel (Reflexed)     Status: Abnormal   Collection Time: 03/06/24   1:26 PM  Result Value Ref Range Status   Enterococcus faecalis NOT DETECTED NOT DETECTED Final   Enterococcus Faecium NOT DETECTED NOT DETECTED Final   Listeria monocytogenes NOT DETECTED NOT DETECTED Final   Staphylococcus species NOT DETECTED NOT DETECTED Final   Staphylococcus aureus (BCID) NOT DETECTED NOT DETECTED Final   Staphylococcus epidermidis NOT DETECTED NOT DETECTED Final   Staphylococcus lugdunensis NOT DETECTED NOT DETECTED Final   Streptococcus species NOT DETECTED NOT DETECTED Final   Streptococcus agalactiae NOT DETECTED NOT  DETECTED Final   Streptococcus pneumoniae NOT DETECTED NOT DETECTED Final   Streptococcus pyogenes NOT DETECTED NOT DETECTED Final   A.calcoaceticus-baumannii NOT DETECTED NOT DETECTED Final   Bacteroides fragilis NOT DETECTED NOT DETECTED Final   Enterobacterales DETECTED (A) NOT DETECTED Final    Comment: Enterobacterales represent a large order of gram negative bacteria, not a single organism. CRITICAL RESULT CALLED TO, READ BACK BY AND VERIFIED WITH: PHARMD G ABBOTT 03/08/2024 @ 0305 BY AB    Enterobacter cloacae complex NOT DETECTED NOT DETECTED Final   Escherichia coli NOT DETECTED NOT DETECTED Final   Klebsiella aerogenes NOT DETECTED NOT DETECTED Final   Klebsiella oxytoca NOT DETECTED NOT DETECTED Final   Klebsiella pneumoniae DETECTED (A) NOT DETECTED Final    Comment: CRITICAL RESULT CALLED TO, READ BACK BY AND VERIFIED WITH: PHARMD G ABBOTT 03/08/2024 @ 0305 BY AB    Proteus species NOT DETECTED NOT DETECTED Final   Salmonella species NOT DETECTED NOT DETECTED Final   Serratia marcescens NOT DETECTED NOT DETECTED Final   Haemophilus influenzae NOT DETECTED NOT DETECTED Final   Neisseria meningitidis NOT DETECTED NOT DETECTED Final   Pseudomonas aeruginosa NOT DETECTED NOT DETECTED Final   Stenotrophomonas maltophilia NOT DETECTED NOT DETECTED Final   Candida albicans NOT DETECTED NOT DETECTED Final   Candida auris NOT DETECTED NOT  DETECTED Final   Candida glabrata NOT DETECTED NOT DETECTED Final   Candida krusei NOT DETECTED NOT DETECTED Final   Candida parapsilosis NOT DETECTED NOT DETECTED Final   Candida tropicalis NOT DETECTED NOT DETECTED Final   Cryptococcus neoformans/gattii NOT DETECTED NOT DETECTED Final   CTX-M ESBL NOT DETECTED NOT DETECTED Final   Carbapenem resistance IMP NOT DETECTED NOT DETECTED Final   Carbapenem resistance KPC NOT DETECTED NOT DETECTED Final   Carbapenem resistance NDM NOT DETECTED NOT DETECTED Final   Carbapenem resist OXA 48 LIKE NOT DETECTED NOT DETECTED Final   Carbapenem resistance VIM NOT DETECTED NOT DETECTED Final    Comment: Performed at [facility], [address], [place], [place] [address]  Culture, blood (Routine X 2) w Reflex to ID Panel     Status: None   Collection Time: 03/06/24  1:31 PM   Specimen: BLOOD LEFT FOREARM  Result Value Ref Range Status   Specimen Description   Final    BLOOD LEFT FOREARM Performed at [facility], [address] [place], [place] [address]    Special Requests   Final    BOTTLES DRAWN AEROBIC AND ANAEROBIC Blood Culture adequate volume Performed at [facility] [address] [place], [place] [address]    Culture   Final    NO GROWTH 5 DAYS Performed at [facility], [address] [place], [place] [address]    Report Status 03/11/2024 FINAL  Final  Urine Culture     Status: Abnormal   Collection Time: 03/06/24  3:13 PM   Specimen: Urine, Random  Result Value Ref Range Status   Specimen Description   Final    URINE, RANDOM Performed at [facility], [address], [place], [place] [address]    Special Requests   Final    NONE Reflexed from [id number] Performed at [facility], [address], [place], [place] [address]    Culture >=100,000 COLONIES/mL KLEBSIELLA PNEUMONIAE (A)  Final   Report Status 03/08/2024 FINAL  Final   Organism ID, Bacteria  KLEBSIELLA PNEUMONIAE (A)  Final  Susceptibility   Klebsiella pneumoniae - MIC*    AMPICILLIN  RESISTANT Resistant     CEFAZOLIN (URINE) Value in next row Sensitive      2 SENSITIVEThis is a modified FDA-approved test that has been validated and its performance characteristics determined by the reporting laboratory.  This laboratory is certified under the Clinical Laboratory Improvement Amendments CLIA as qualified to perform high complexity clinical laboratory testing.    CEFEPIME  Value in next row Sensitive      2 SENSITIVEThis is a modified FDA-approved test that has been validated and its performance characteristics determined by the reporting laboratory.  This laboratory is certified under the Clinical Laboratory Improvement Amendments CLIA as qualified to perform high complexity clinical laboratory testing.    ERTAPENEM Value in next row Sensitive      2 SENSITIVEThis is a modified FDA-approved test that has been validated and its performance characteristics determined by the reporting laboratory.  This laboratory is certified under the Clinical Laboratory Improvement Amendments CLIA as qualified to perform high complexity clinical laboratory testing.    CEFTRIAXONE  Value in next row Sensitive      2 SENSITIVEThis is a modified FDA-approved test that has been validated and its performance characteristics determined by the reporting laboratory.  This laboratory is certified under the Clinical Laboratory Improvement Amendments CLIA as qualified to perform high complexity clinical laboratory testing.    CIPROFLOXACIN Value in next row Sensitive      2 SENSITIVEThis is a modified FDA-approved test that has been validated and its performance characteristics determined by the reporting laboratory.  This laboratory is certified under the Clinical Laboratory Improvement Amendments CLIA as qualified to perform high complexity clinical laboratory testing.    GENTAMICIN Value in next row Sensitive       2 SENSITIVEThis is a modified FDA-approved test that has been validated and its performance characteristics determined by the reporting laboratory.  This laboratory is certified under the Clinical Laboratory Improvement Amendments CLIA as qualified to perform high complexity clinical laboratory testing.    NITROFURANTOIN Value in next row Intermediate      2 SENSITIVEThis is a modified FDA-approved test that has been validated and its performance characteristics determined by the reporting laboratory.  This laboratory is certified under the Clinical Laboratory Improvement Amendments CLIA as qualified to perform high complexity clinical laboratory testing.    TRIMETH/SULFA Value in next row Sensitive      2 SENSITIVEThis is a modified FDA-approved test that has been validated and its performance characteristics determined by the reporting laboratory.  This laboratory is certified under the Clinical Laboratory Improvement Amendments CLIA as qualified to perform high complexity clinical laboratory testing.    AMPICILLIN /SULBACTAM Value in next row Sensitive      2 SENSITIVEThis is a modified FDA-approved test that has been validated and its performance characteristics determined by the reporting laboratory.  This laboratory is certified under the Clinical Laboratory Improvement Amendments CLIA as qualified to perform high complexity clinical laboratory testing.    PIP/TAZO Value in next row Sensitive      <=4 SENSITIVEThis is a modified FDA-approved test that has been validated and its performance characteristics determined by the reporting laboratory.  This laboratory is certified under the Clinical Laboratory Improvement Amendments CLIA as qualified to perform high complexity clinical laboratory testing.    MEROPENEM Value in next row Sensitive      <=4 SENSITIVEThis is a modified FDA-approved test that has been validated and its performance characteristics determined by the reporting laboratory.  This  laboratory is certified under the Clinical Laboratory Improvement Amendments CLIA as qualified to perform high complexity clinical laboratory testing.    * >=100,000 COLONIES/mL KLEBSIELLA PNEUMONIAE    Labs: Results for orders placed or performed during the hospital encounter of 03/06/24 (from the past 48 hours)  CBC with Differential/Platelet     Status: Abnormal   Collection Time: 03/10/24  4:03 AM  Result Value Ref Range   WBC 10.2 4.0 - 10.5 K/uL   RBC 3.27 (L) 4.22 - 5.81 MIL/uL   Hemoglobin 8.6 (L) 13.0 - 17.0 g/dL   HCT 72.2 (L) 60.9 - 47.9 %   MCV 84.7 80.0 - 100.0 fL   MCH 26.3 26.0 - 34.0 pg   MCHC 31.0 30.0 - 36.0 g/dL   RDW 84.7 88.4 - 84.4 %   Platelets 275 150 - 400 K/uL   nRBC 0.0 0.0 - 0.2 %   Neutrophils Relative % 66 %   Neutro Abs 6.7 1.7 - 7.7 K/uL   Lymphocytes Relative 19 %   Lymphs Abs 2.0 0.7 - 4.0 K/uL   Monocytes Relative 10 %   Monocytes Absolute 1.0 0.1 - 1.0 K/uL   Eosinophils Relative 2 %   Eosinophils Absolute 0.2 0.0 - 0.5 K/uL   Basophils Relative 2 %   Basophils Absolute 0.2 (H) 0.0 - 0.1 K/uL   Immature Granulocytes 1 %   Abs Immature Granulocytes 0.14 (H) 0.00 - 0.07 K/uL    Comment: Performed at [facility], [address], [place], [place] [address]  Comprehensive metabolic panel with GFR     Status: Abnormal   Collection Time: 03/10/24  4:03 AM  Result Value Ref Range   Sodium 138 135 - 145 mmol/L   Potassium 4.2 3.5 - 5.1 mmol/L   Chloride 104 98 - 111 mmol/L   CO2 25 22 - 32 mmol/L   Glucose, Bld 115 (H) 70 - 99 mg/dL    Comment: Glucose reference range applies only to samples taken after fasting for at least 8 hours.   BUN 31 (H) 8 - 23 mg/dL   Creatinine, Ser 7.38 (H) 0.61 - 1.24 mg/dL   Calcium  8.5 (L) 8.9 - 10.3 mg/dL   Total Protein 6.5 6.5 - 8.1 g/dL   Albumin  2.7 (L) 3.5 - 5.0 g/dL   AST 19 15 - 41 U/L   ALT 9 0 - 44 U/L   Alkaline Phosphatase 56 38 - 126 U/L   Total Bilirubin 0.5 0.0 - 1.2 mg/dL   GFR,  Estimated 25 (L) >60 mL/min    Comment: (NOTE) Calculated using the CKD-EPI Creatinine Equation (2021)    Anion gap 9 5 - 15    Comment: Performed at [facility], [address], [place], [place] [address]  Magnesium      Status: None   Collection Time: 03/10/24  4:03 AM  Result Value Ref Range   Magnesium  2.3 1.7 - 2.4 mg/dL    Comment: Performed at [facility], [address], [place], [place] [address]  Phosphorus     Status: None   Collection Time: 03/10/24  4:03 AM  Result Value Ref Range   Phosphorus 3.3 2.5 - 4.6 mg/dL    Comment: Performed at [facility], [address] [place], [place] [address]  Prealbumin     Status: Abnormal   Collection Time: 03/10/24  4:03 AM  Result Value Ref Range   Prealbumin 12 (L) 18 -  38 mg/dL    Comment: Performed at [facility], [address], [place], [place] [address]  CBC with Differential/Platelet     Status: Abnormal   Collection Time: 03/11/24  2:59 AM  Result Value Ref Range   WBC 9.2 4.0 - 10.5 K/uL   RBC 3.23 (L) 4.22 - 5.81 MIL/uL   Hemoglobin 8.6 (L) 13.0 - 17.0 g/dL   HCT 71.8 (L) 60.9 - 47.9 %   MCV 87.0 80.0 - 100.0 fL   MCH 26.6 26.0 - 34.0 pg   MCHC 30.6 30.0 - 36.0 g/dL   RDW 84.7 88.4 - 84.4 %   Platelets 245 150 - 400 K/uL   nRBC 0.0 0.0 - 0.2 %   Neutrophils Relative % 65 %   Neutro Abs 5.9 1.7 - 7.7 K/uL   Lymphocytes Relative 22 %   Lymphs Abs 2.0 0.7 - 4.0 K/uL   Monocytes Relative 8 %   Monocytes Absolute 0.7 0.1 - 1.0 K/uL   Eosinophils Relative 2 %   Eosinophils Absolute 0.2 0.0 - 0.5 K/uL   Basophils Relative 2 %   Basophils Absolute 0.2 (H) 0.0 - 0.1 K/uL   Immature Granulocytes 1 %   Abs Immature Granulocytes 0.13 (H) 0.00 - 0.07 K/uL    Comment: Performed at [facility], [address] [place], [place] [address]  Comprehensive metabolic panel with GFR     Status: Abnormal   Collection Time: 03/11/24  2:59 AM  Result Value Ref Range   Sodium 139 135 - 145 mmol/L    Potassium 4.4 3.5 - 5.1 mmol/L   Chloride 102 98 - 111 mmol/L   CO2 24 22 - 32 mmol/L   Glucose, Bld 107 (H) 70 - 99 mg/dL    Comment: Glucose reference range applies only to samples taken after fasting for at least 8 hours.   BUN 25 (H) 8 - 23 mg/dL   Creatinine, Ser 7.59 (H) 0.61 - 1.24 mg/dL   Calcium  8.5 (L) 8.9 - 10.3 mg/dL   Total Protein 6.1 (L) 6.5 - 8.1 g/dL   Albumin  2.5 (L) 3.5 - 5.0 g/dL   AST 18 15 - 41 U/L   ALT 9 0 - 44 U/L   Alkaline Phosphatase 58 38 - 126 U/L   Total Bilirubin 0.5 0.0 - 1.2 mg/dL   GFR, Estimated 27 (L) >60 mL/min    Comment: (NOTE) Calculated using the CKD-EPI Creatinine Equation (2021)    Anion gap 13 5 - 15    Comment: Performed at [facility], [address] [place], [place] [address]  Magnesium      Status: None   Collection Time: 03/11/24  2:59 AM  Result Value Ref Range   Magnesium  2.1 1.7 - 2.4 mg/dL    Comment: Performed at [facility], [address] [place], [place] [address]    Imaging / Studies: CT PELVIS WO CONTRAST Result Date: 03/10/2024 CLINICAL DATA:  History of colovesical fistula repair. Rule out anastomotic leak or recurrent fistula. EXAM: CT CYSTOGRAM (CT PELVIS WITH CONTRAST) TECHNIQUE: Multidetector CT imaging through the pelvis was performed after dilute contrast had been introduced into the bladder for the purposes of performing CT cystography. RADIATION DOSE REDUCTION: This exam was performed according to the departmental dose-optimization program which includes automated exposure control, adjustment of the mA and/or kV according to patient size and/or use of iterative reconstruction technique. CONTRAST:  OMNIPAQUE  IOHEXOL  300 MG/ML  SOLN COMPARISON:  03/07/2024 FINDINGS: Initially, a cystogram was performed with pre and post bladder contrast imaging. Subsequently, after draining the bladder of contrast, rectal contrast was administered. Urinary Tract: The mild left-sided hydronephrosis has resolved.  The mild-to-moderate right-sided hydroureteronephrosis is improved. Foley catheter within the bladder. Intra bladder contrast demonstrates multiple small saccules. Persistent bladder wall thickening. No contrast identified outside the bladder or within the adjacent bowel. Bowel: Surgical sutures within the distal sigmoid. Colonic contrast opacification is moderate within the rectum and distal sigmoid. More proximal contrast not present. The patient removed the enema tip prior to more proximal contrast opacification. No anastomotic leak identified. Scattered colonic diverticula. Diverting right lower quadrant loop ileostomy. Vascular/Lymphatic: Aortic Atherosclerosis. No pelvic sidewall adenopathy. Reproductive:  Mild prostatomegaly. Other:  No extraluminal gas or free pelvic fluid. Musculoskeletal: Presumed bone island in the right ischium. IMPRESSION: 1. Performance of a cystogram followed by a pelvic CT with rectal contrast administration demonstrates no evidence of contrast extravasation or colovesical fistula. 2. Prostatomegaly with bladder wall thickening and saccules, suggesting outlet obstruction. 3. Resolved left and improved right-sided hydroureteronephrosis. Electronically Signed   By: Rockey Kilts M.D.   On: 03/10/2024 17:02   DG Abd Portable 1V Result Date: 03/09/2024 CLINICAL DATA:  Constipation. EXAM: DG ABD PORTABLE 1V COMPARISON:  None Available. FINDINGS: Right lower quadrant ostomy. Scattered gas-filled large and small bowel. No large or small bowel distention. No radiopaque stones. Degenerative changes in the spine and hips. Calcified phleboliths in the pelvis. Vascular calcifications. Lung bases are clear. IMPRESSION: Normal nonobstructive bowel gas pattern. Right lower quadrant ostomy. Electronically Signed   By: Elsie Gravely M.D.   On: 03/09/2024 15:58     Medications / Allergies: per chart  Antibiotics: Anti-infectives (From admission, onward)    Start     Dose/Rate Route  Frequency Ordered Stop   03/10/24 1315  ceFAZolin (ANCEF) IVPB 2g/100 mL premix        2 g 200 mL/hr over 30 Minutes Intravenous Every 12 hours 03/10/24 1221 03/14/24 1759   03/07/24 1800  cefTRIAXone  (ROCEPHIN ) 2 g in sodium chloride  0.9 % 100 mL IVPB  Status:  Discontinued        2 g 200 mL/hr over 30 Minutes Intravenous Every 24 hours 03/07/24 0842 03/10/24 1221   03/07/24 0930  azithromycin (ZITHROMAX) tablet 500 mg  Status:  Discontinued        500 mg Oral Daily 03/07/24 0842 03/09/24 1046   03/06/24 1800  cefTRIAXone  (ROCEPHIN ) 1 g in sodium chloride  0.9 % 100 mL IVPB  Status:  Discontinued        1 g 200 mL/hr over 30 Minutes Intravenous Every 24 hours 03/06/24 1703 03/07/24 0842         Note: Portions of this report may have been transcribed using voice recognition software. Every effort was made to ensure accuracy; however, inadvertent computerized transcription errors may be present.   Any transcriptional errors that result from this process are unintentional.    Elspeth KYM Schultze, MD, FACS, MASCRS Esophageal, Gastrointestinal & Colorectal Surgery Robotic and Minimally Invasive Surgery  [facility] [address], [address] [place], [place] [address] [phone] Fax [phone] Main  CONTACT INFORMATION: Weekday (9AM-5PM): Call [facility] main office at [phone] Weeknight (5PM-9AM) or Weekend/Holiday: Check EPIC Web Links tab & use AMION (password  [email or web address]) for General Surgery [facility] coverage  Please, DO NOT use SecureChat  (it is not reliable communication to reach operating surgeons & will  lead to a delay in care).   Epic staff messaging available for outptient concerns needing 1-2 business day response.      03/11/2024  7:55 AM

## 2024-03-11 NOTE — Progress Notes (Signed)
 Called [facility] and informed pt's PASRR request needs to be resubmitted. CSW resubmitted requests and uploaded documents. PASRR is pending. CSW called spouse and updated her.

## 2024-03-11 NOTE — Progress Notes (Signed)
 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 Lyndell was [id number] centimeter abscess found IntraOp Readmission 9/3 through 9/7 large amount of output from ileostomy-had AKI with creatinine 3.1 (baseline 1.3) found to have gram negative bacteremia from UTI/Klebsiella and was discharged to skilled nursing facility  10/3 readmit metabolic encephalopathy from skilled facility in the setting of uremia hyperkalemia and borderline anemia for him  Labs on admit Sodium 131 potassium 5.7 BUN/creatinine 71/5.4 (21/1.4) LFTs normal procalcitonin 3.3 WBC 6.8 hemoglobin 7.7 platelet 192 UA turbid moderate leukocytes moderate hemoglobin Urine culture eventually showed Klebsiella again  Imaging CXR very low bilateral lung volumes with bibasilar atelectasis  Nephrology consulted-fractional excretion sodium 2% indeterminant renal ultrasound hydronephrosis with Boop Urology consulted--CT cystogram 10/8 = dramatic right hydro improvement with right ureteric reflux causing right hydro and Boop is present and bladder not draining recommend continued bladder drainage There is no colovesical fistula and no indication to stent R ureter-urology team will follow in the outpatient setting to set up follow-up as an outpatient    Assessment  & Plan :    Acute kidney injury Likely  secondary to large volume losses with high output ostomy Hopefully per general surgery plan may have reversal of ostomy this admission tentatively 10/9? Kidney function already improving to some degree indicating this is all prerenal and nephrology have signed off Sepsis secondary to Klebsiella UTI-urine culture confirms >100,000 K pneumonia with intermediate resistance since ampicillin  and nitrofurantoin Continues currently Ancef 2 g Q12 monotherapy As will be going to surgery would continue IV antibiotics through surgery Acute encephalopathy secondary to uremia and multiple beers list criteria medications--holding Norco, Abilify  2, Remeron  30, trazodone  100, Effexor  75 and 150 He has been placed in nonviolent restraints with safety observation He will need to be fed Try to avoid Haldol  2 mg every 6 as needed IM but order if severe Continuing Seroquel 25 daily and 50 at bedtime for agitation as he was pulling at his ostomy Foley etc. Continue BuSpar  10 3 times daily Chronic HFrEF echo 12/2023 EF 25-30% Chronic indwelling Foley with history of BPH and urethral stricture status postdilatation 11/30/2023 Urology saw the patient and did CT cystogram which showed above They are recommending outpatient management options Recent anterior low resection for colovesical fistula- Dr. Sheldon guiding therapy with fiber twice daily and iron  twice daily antidiarrheals as needed-needs to have less than 1000 cc out-CT with rectal contrast only Defer to general surgery as above OKAYED FROM MY PERSPECTIVE FOR ANY TYPE OF SURGERY    Data Reviewed:   Sodium 129 potassium 4.4 BUN/creatinine 25/2.4 LFTs normal WBC 9.2 hemoglobin 8.6 platelet 245 CT abdomen pelvis without contrast-cystogram followed by pelvic CT showed no evidence of contrast extravasation or colovesical fistula-prostatomegaly with bladder wall thickening saccules suggesting outlet obstruction resolved right and left to improve right sided  hydroureteronephrosis  DVT prophylaxis: SCD  Status  is: Inpatient Remains inpatient appropriate because: Needs surgery     Current Dispo: Inpatient     Subjective:    Slow speech-a little confused In restraints Ostomy in place with brownish succus/stool semiliquid Chest is clear no wheeze He is not really interactive but asked me to call his wife   Objective + exam Vitals:   03/10/24 1000 03/10/24 2000 03/10/24 2333 03/11/24 0426  BP: 119/79 (!) 140/104 139/86 (!) 140/82  Pulse: 68  97 86  Resp: (!) 21 15  18   Temp: 98.3 F (36.8 C) 98 F (36.7 C) 98.2 F (36.8 C) 98 F (36.7 C)  TempSrc:  Oral Oral Oral  SpO2:   100% 98%  Weight:      Height:       Filed Weights   03/08/24 0500 03/09/24 0406 03/10/24 0412  Weight: 95.6 kg 96.7 kg 93.5 kg     Examination: EOMI NCAT no focal deficit no icterus no pallor looks chronically ill dry mucosa Chest clear Abdomen is soft no rebound with ostomy in place-has indwelling Foley catheter in place ROM is intact-does not want to cooperate he is in [employer] remains untouched at the bedside     Scheduled Meds:  busPIRone   10 mg Oral TID   carvedilol   3.125 mg Oral BID WC   Chlorhexidine  Gluconate Cloth  6 each Topical Daily   ferrous sulfate   325 mg Oral BID WC   finasteride   5 mg Oral Daily   folic acid   1 mg Oral Daily   heparin   5,000 Units Subcutaneous Q8H   melatonin  5 mg Oral QHS   pantoprazole   40 mg Oral Daily   polycarbophil  625 mg Oral BID   QUEtiapine  25 mg Oral Q0600   QUEtiapine  50 mg Oral QHS   tamsulosin   0.4 mg Oral Daily   Continuous Infusions:   ceFAZolin (ANCEF) IV 2 g (03/10/24 1748)    Time 60  Colen Grimes, MD  [facility]

## 2024-03-11 NOTE — Progress Notes (Addendum)
 03/11/2024  Calvin Escobar [id number] [birth year]  CARE TEAM: PCP: [name], MD  Outpatient Care Team: Patient Care Team: [name], MD as PCP - General (Internal Medicine) [name], MD as PCP - Cardiology (Cardiology) [name], MD (Dermatology) [facility] (Behavioral Health) [facility] (Psychiatry) [name], [name] LABOR, MD (Psychiatry) Legrand Victory LITTIE MOULD, MD as Consulting Physician (Gastroenterology) Sheldon Standing, MD as Consulting Physician (Colon and Rectal Surgery) [name], MD as Consulting Physician (Urology) Daneen Damien BROCKS, NP as Nurse Practitioner (Cardiology)  Inpatient Treatment Team: Treatment Team:  Samtani, Jai-Gurmukh, MD Bobbette Lights, MD Shane Steffan BROCKS, MD Sheldon Standing, MD Boston Morrison, RN Fermin Fordyce, RN Apickup-Ot, A, OT Mikki Margarie BRAVO, OT Estelle Hunter DEL, RN Mila Johnston HERO, RN Lurena Luana RAMAN, NT Peri Ozella BROCKS, [place]   Problem List:   Principal Problem:   Acute kidney injury superimposed on stage 3a chronic kidney disease [facility]) with hyperkalemia Active Problems:   Dementia (HCC)   Ileostomy in place [facility])   Bipolar affective disorder, depressed (HCC)   Chronic HFrEF (heart failure with reduced ejection fraction) (HCC)   HLD (hyperlipidemia)   Hyperkalemia   Morbid obesity (HCC)   Hypertension   Chronic kidney disease, stage 3a (HCC)   Anemia of chronic disease   Recurrent UTI   GERD (gastroesophageal reflux disease)   Hydronephrosis, right   History of urinary retention   Chronic indwelling Foley catheter   Malnutrition of moderate degree   OSA on CPAP   Diverticulitis of sigmoid colon s/p robotic colectomy 01/09/2024   Acute encephalopathy   Chronic hyponatremia   01/09/2024  POST-OPERATIVE DIAGNOSIS:   COLOVESICAL FISTULA WITH ABSCESS RECTOSIGMOID STRICTURE   PROCEDURE:   -ROBOTIC LOW ANTERIOR RECTOSIGMOID RESECTION (LAR) WITH  ANASTOMOSIS -COLOVESICAL FISTULA TAKEDOWN & REPAIR -DIVERTING LOOP ILEOSTOMY (DLI),  =DRAINAGE OF PELVIC ABSCESS -LYSIS OF ADHESIONS x45 MINUTES (30% OF CASE) -INTRAOPERATIVE ASSESSMENT OF TISSUE VASCULAR PERFUSION USING ICG (indocyanine green ) IMMUNOFLUORESCENCE -TRANSVERSUS ABDOMINIS PLANE (TAP) BLOCK - BILATERAL -FLEXIBLE SIGMOIDOSCOPY   SURGEON:  [name], MD  OR FINDINGS:   Patient had very thickened rectosigmoid colon folded and twisted upon itself with stricturing.  Extremely dense adhesions to the left dome of the bladder with 4 x 3 x 3 cm abscess.  Abscess decompressed.  No active persistent opening of the bladder on insufflation.   No obvious metastatic disease on visceral parietal peritoneum or liver.   It is a 31mm EEA anastomosis ( distal descending colon  connected to proximal rectum.)  It rests 17 cm from the anal verge by flexible sigmoidoscopy    Assessment [facility] Stay = 5 days)      Guarded but stable   Plan:  The patient is 2 months out from his rectosigmoid resection with takedown and repair of his colovesical fistula.   Contrast enema is reassuring that there is no colorectal anastomotic leak nor any recurrent colovesical fistula.  CT cystogram also reassuring that there is no evidence of any bladder leak from that side either.  Less hydronephrosis.  They suspect chronic bladder outlet problems and therefore keep Foley catheter even longer.  Defer to urology.  I believe they are pending outpatient follow-up.  Standard of care is to consider loop ileostomy takedown at some point.  Sometimes we do it early as 6 weeks.  Usually wait 3 months.  It is been 2 months.  Because he has bounced back with probable high output ileostomy  leading to dehydration AKI and worsening CKD, may be reasonable to move it up sooner since he is outside the window of the leak and there is no proof of 1.  He also keeps pulling it off.  The loop ileostomy takedown is a much simpler  operation and less risky than the bigger robotic resection done 2 months ago.  Risk of leak with small bowel anastomosis much less.  That would help look back his colon up and hopefully avoid less risk of dehydration.  However, he is confused and deconditioned.  No easy answers here.  He would be helpful to get a sense if he is cleared from a medical standpoint.  He has intermittent confusion has been chronic.  I do not know if it is worse.  I do not know if delaying things another month for him to get stronger would help versus the risk of readmission.  I do not have any time right now to do it but I could try and schedule electively in the next month.    -Solid diet as tolerated.  Apparently he is a very picky eater.  Will see if nutrition has other options to help.  I suspect he could benefit from supplemental shakes but he tends to avoid them as well.    mobilize as tolerated to help recovery.  Enlist therapies in moderate/high risk patients as appropriate.  Physical therapy and Occupational Therapy ordered.    I updated the patient's status to the patient patient's wife was at bedside so was able to update and give a long discussion about surgical interventions, reasoning, postoperative recommendations and plans.  Recommendations were made.  Questions were answered.  He expressed some understanding & appreciation.  -Disposition: TBD    I reviewed nursing notes, hospitalist notes, last 24 h vitals and pain scores, last 48 h intake and output, last 24 h labs and trends, and last 24 h imaging results.  I have reviewed this patient's available data, including medical history, events of note, test results, etc as part of my evaluation.   A significant portion of that time was spent in counseling. Care during the described time interval was provided by me.  This care required moderate level of medical decision making.  03/11/2024    Subjective: (Chief complaint)  Noticed patient  readmitted on chart check.  Contrast enema is done as well as CT cystogram.  No evidence of any bladder or colorectal anastomotic leak.  No evidence of any recurrent colovesical fistula.  Patient in bed.  Intermittently confused.  Some soft wrist restraints.  Denies any nausea or vomiting.  He is interested in getting his diverting loop ileostomy down at some point.  Objective:  Vital signs:  Vitals:   03/10/24 2000 03/10/24 2333 03/11/24 0426 03/11/24 0500  BP: (!) 140/104 139/86 (!) 140/82   Pulse:  97 86   Resp: 15  18   Temp: 98 F (36.7 C) 98.2 F (36.8 C) 98 F (36.7 C)   TempSrc: Oral Oral Oral   SpO2:  100% 98%   Weight:    90.6 kg  Height:        Last BM Date : 03/11/24  Intake/Output   Yesterday:  10/07 0701 - 10/08 0700 In: 960 [P.O.:960] Out: 850 [Urine:800; Stool:50] This shift:  No intake/output data recorded.  Bowel function:  Flatus: YES  BM:  YES - thin succuss  Drain: (No drain)   Physical Exam:  General: Pt sleeping but awakens to be  alert in no acute distress Eyes: PERRL, normal EOM.  Sclera clear.  No icterus Neuro: CN II-XII intact w/o focal sensory/motor deficits. Lymph: No head/neck/groin lymphadenopathy Psych: Infusion but no agitation.  No delerium/psychosis/paranoia.  Oriented x 2-4 HENT: Normocephalic, Mucus membranes moist.  No thrush Neck: Supple, No tracheal deviation.  No obvious thyromegaly Chest: No pain to chest wall compression.  Good respiratory excursion.  No audible wheezing CV:  Pulses intact.  Regular rhythm.  No major extremity edema MS: Normal AROM mjr joints.  No obvious deformity  Abdomen: Soft.  Nondistended.  Nontender.  No evidence of peritonitis.  No incarcerated hernias. Right infraumbilical paramedian diverting loop ileostomy in place.  Pink with Play-Doh consistency effluent in bag   Ext:   No deformity.  No mjr edema.  No cyanosis Skin: No petechiae / purpurea.  No major sores.  Warm and  dry    Results:   Cultures: Recent Results (from the past 720 hours)  Culture, blood (Routine X 2) w Reflex to ID Panel     Status: Abnormal   Collection Time: 03/06/24  1:26 PM   Specimen: Right Antecubital; Blood  Result Value Ref Range Status   Specimen Description   Final    RIGHT ANTECUBITAL Performed at [facility], [address] [place], [place] [address]    Special Requests   Final    BOTTLES DRAWN AEROBIC AND ANAEROBIC Blood Culture adequate volume Performed at [facility], [address], [place], [place] [address]    Culture  Setup Time   Final    GRAM NEGATIVE RODS IN BOTH AEROBIC AND ANAEROBIC BOTTLES CRITICAL RESULT CALLED TO, READ BACK BY AND VERIFIED WITH: PHARMD G ABBOTT 03/08/2024 @ 0305 BY AB Performed at [facility], [address], [place], [place] [address]    Culture KLEBSIELLA PNEUMONIAE (A)  Final   Report Status 03/10/2024 FINAL  Final   Organism ID, Bacteria KLEBSIELLA PNEUMONIAE  Final      Susceptibility   Klebsiella pneumoniae - MIC*    AMPICILLIN  RESISTANT Resistant     CEFAZOLIN (NON-URINE) 2 SENSITIVE Sensitive     CEFEPIME  <=0.12 SENSITIVE Sensitive     ERTAPENEM <=0.12 SENSITIVE Sensitive     CEFTRIAXONE  <=0.25 SENSITIVE Sensitive     CIPROFLOXACIN <=0.06 SENSITIVE Sensitive     GENTAMICIN <=1 SENSITIVE Sensitive     MEROPENEM <=0.25 SENSITIVE Sensitive     TRIMETH/SULFA <=20 SENSITIVE Sensitive     AMPICILLIN /SULBACTAM 4 SENSITIVE Sensitive     PIP/TAZO Value in next row Sensitive      <=4 SENSITIVEThis is a modified FDA-approved test that has been validated and its performance characteristics determined by the reporting laboratory.  This laboratory is certified under the Clinical Laboratory Improvement Amendments CLIA as qualified to perform high complexity clinical laboratory testing.    * KLEBSIELLA PNEUMONIAE  Blood Culture ID Panel (Reflexed)     Status: Abnormal   Collection Time: 03/06/24   1:26 PM  Result Value Ref Range Status   Enterococcus faecalis NOT DETECTED NOT DETECTED Final   Enterococcus Faecium NOT DETECTED NOT DETECTED Final   Listeria monocytogenes NOT DETECTED NOT DETECTED Final   Staphylococcus species NOT DETECTED NOT DETECTED Final   Staphylococcus aureus (BCID) NOT DETECTED NOT DETECTED Final   Staphylococcus epidermidis NOT DETECTED NOT DETECTED Final   Staphylococcus lugdunensis NOT DETECTED NOT DETECTED Final   Streptococcus species NOT DETECTED NOT DETECTED Final   Streptococcus agalactiae NOT DETECTED NOT  DETECTED Final   Streptococcus pneumoniae NOT DETECTED NOT DETECTED Final   Streptococcus pyogenes NOT DETECTED NOT DETECTED Final   A.calcoaceticus-baumannii NOT DETECTED NOT DETECTED Final   Bacteroides fragilis NOT DETECTED NOT DETECTED Final   Enterobacterales DETECTED (A) NOT DETECTED Final    Comment: Enterobacterales represent a large order of gram negative bacteria, not a single organism. CRITICAL RESULT CALLED TO, READ BACK BY AND VERIFIED WITH: PHARMD G ABBOTT 03/08/2024 @ 0305 BY AB    Enterobacter cloacae complex NOT DETECTED NOT DETECTED Final   Escherichia coli NOT DETECTED NOT DETECTED Final   Klebsiella aerogenes NOT DETECTED NOT DETECTED Final   Klebsiella oxytoca NOT DETECTED NOT DETECTED Final   Klebsiella pneumoniae DETECTED (A) NOT DETECTED Final    Comment: CRITICAL RESULT CALLED TO, READ BACK BY AND VERIFIED WITH: PHARMD G ABBOTT 03/08/2024 @ 0305 BY AB    Proteus species NOT DETECTED NOT DETECTED Final   Salmonella species NOT DETECTED NOT DETECTED Final   Serratia marcescens NOT DETECTED NOT DETECTED Final   Haemophilus influenzae NOT DETECTED NOT DETECTED Final   Neisseria meningitidis NOT DETECTED NOT DETECTED Final   Pseudomonas aeruginosa NOT DETECTED NOT DETECTED Final   Stenotrophomonas maltophilia NOT DETECTED NOT DETECTED Final   Candida albicans NOT DETECTED NOT DETECTED Final   Candida auris NOT DETECTED NOT  DETECTED Final   Candida glabrata NOT DETECTED NOT DETECTED Final   Candida krusei NOT DETECTED NOT DETECTED Final   Candida parapsilosis NOT DETECTED NOT DETECTED Final   Candida tropicalis NOT DETECTED NOT DETECTED Final   Cryptococcus neoformans/gattii NOT DETECTED NOT DETECTED Final   CTX-M ESBL NOT DETECTED NOT DETECTED Final   Carbapenem resistance IMP NOT DETECTED NOT DETECTED Final   Carbapenem resistance KPC NOT DETECTED NOT DETECTED Final   Carbapenem resistance NDM NOT DETECTED NOT DETECTED Final   Carbapenem resist OXA 48 LIKE NOT DETECTED NOT DETECTED Final   Carbapenem resistance VIM NOT DETECTED NOT DETECTED Final    Comment: Performed at [facility], [address], [place], [place] [address]  Culture, blood (Routine X 2) w Reflex to ID Panel     Status: None   Collection Time: 03/06/24  1:31 PM   Specimen: BLOOD LEFT FOREARM  Result Value Ref Range Status   Specimen Description   Final    BLOOD LEFT FOREARM Performed at [facility], [address] [place], [place] [address]    Special Requests   Final    BOTTLES DRAWN AEROBIC AND ANAEROBIC Blood Culture adequate volume Performed at [facility] [address] [place], [place] [address]    Culture   Final    NO GROWTH 5 DAYS Performed at [facility], [address] [place], [place] [address]    Report Status 03/11/2024 FINAL  Final  Urine Culture     Status: Abnormal   Collection Time: 03/06/24  3:13 PM   Specimen: Urine, Random  Result Value Ref Range Status   Specimen Description   Final    URINE, RANDOM Performed at [facility], [address], [place], [place] [address]    Special Requests   Final    NONE Reflexed from [id number] Performed at [facility], [address], [place], [place] [address]    Culture >=100,000 COLONIES/mL KLEBSIELLA PNEUMONIAE (A)  Final   Report Status 03/08/2024 FINAL  Final   Organism ID, Bacteria  KLEBSIELLA PNEUMONIAE (A)  Final  Susceptibility   Klebsiella pneumoniae - MIC*    AMPICILLIN  RESISTANT Resistant     CEFAZOLIN (URINE) Value in next row Sensitive      2 SENSITIVEThis is a modified FDA-approved test that has been validated and its performance characteristics determined by the reporting laboratory.  This laboratory is certified under the Clinical Laboratory Improvement Amendments CLIA as qualified to perform high complexity clinical laboratory testing.    CEFEPIME  Value in next row Sensitive      2 SENSITIVEThis is a modified FDA-approved test that has been validated and its performance characteristics determined by the reporting laboratory.  This laboratory is certified under the Clinical Laboratory Improvement Amendments CLIA as qualified to perform high complexity clinical laboratory testing.    ERTAPENEM Value in next row Sensitive      2 SENSITIVEThis is a modified FDA-approved test that has been validated and its performance characteristics determined by the reporting laboratory.  This laboratory is certified under the Clinical Laboratory Improvement Amendments CLIA as qualified to perform high complexity clinical laboratory testing.    CEFTRIAXONE  Value in next row Sensitive      2 SENSITIVEThis is a modified FDA-approved test that has been validated and its performance characteristics determined by the reporting laboratory.  This laboratory is certified under the Clinical Laboratory Improvement Amendments CLIA as qualified to perform high complexity clinical laboratory testing.    CIPROFLOXACIN Value in next row Sensitive      2 SENSITIVEThis is a modified FDA-approved test that has been validated and its performance characteristics determined by the reporting laboratory.  This laboratory is certified under the Clinical Laboratory Improvement Amendments CLIA as qualified to perform high complexity clinical laboratory testing.    GENTAMICIN Value in next row Sensitive       2 SENSITIVEThis is a modified FDA-approved test that has been validated and its performance characteristics determined by the reporting laboratory.  This laboratory is certified under the Clinical Laboratory Improvement Amendments CLIA as qualified to perform high complexity clinical laboratory testing.    NITROFURANTOIN Value in next row Intermediate      2 SENSITIVEThis is a modified FDA-approved test that has been validated and its performance characteristics determined by the reporting laboratory.  This laboratory is certified under the Clinical Laboratory Improvement Amendments CLIA as qualified to perform high complexity clinical laboratory testing.    TRIMETH/SULFA Value in next row Sensitive      2 SENSITIVEThis is a modified FDA-approved test that has been validated and its performance characteristics determined by the reporting laboratory.  This laboratory is certified under the Clinical Laboratory Improvement Amendments CLIA as qualified to perform high complexity clinical laboratory testing.    AMPICILLIN /SULBACTAM Value in next row Sensitive      2 SENSITIVEThis is a modified FDA-approved test that has been validated and its performance characteristics determined by the reporting laboratory.  This laboratory is certified under the Clinical Laboratory Improvement Amendments CLIA as qualified to perform high complexity clinical laboratory testing.    PIP/TAZO Value in next row Sensitive      <=4 SENSITIVEThis is a modified FDA-approved test that has been validated and its performance characteristics determined by the reporting laboratory.  This laboratory is certified under the Clinical Laboratory Improvement Amendments CLIA as qualified to perform high complexity clinical laboratory testing.    MEROPENEM Value in next row Sensitive      <=4 SENSITIVEThis is a modified FDA-approved test that has been validated and its performance characteristics determined by the reporting laboratory.  This  laboratory is certified under the Clinical Laboratory Improvement Amendments CLIA as qualified to perform high complexity clinical laboratory testing.    * >=100,000 COLONIES/mL KLEBSIELLA PNEUMONIAE    Labs: Results for orders placed or performed during the hospital encounter of 03/06/24 (from the past 48 hours)  CBC with Differential/Platelet     Status: Abnormal   Collection Time: 03/10/24  4:03 AM  Result Value Ref Range   WBC 10.2 4.0 - 10.5 K/uL   RBC 3.27 (L) 4.22 - 5.81 MIL/uL   Hemoglobin 8.6 (L) 13.0 - 17.0 g/dL   HCT 72.2 (L) 60.9 - 47.9 %   MCV 84.7 80.0 - 100.0 fL   MCH 26.3 26.0 - 34.0 pg   MCHC 31.0 30.0 - 36.0 g/dL   RDW 84.7 88.4 - 84.4 %   Platelets 275 150 - 400 K/uL   nRBC 0.0 0.0 - 0.2 %   Neutrophils Relative % 66 %   Neutro Abs 6.7 1.7 - 7.7 K/uL   Lymphocytes Relative 19 %   Lymphs Abs 2.0 0.7 - 4.0 K/uL   Monocytes Relative 10 %   Monocytes Absolute 1.0 0.1 - 1.0 K/uL   Eosinophils Relative 2 %   Eosinophils Absolute 0.2 0.0 - 0.5 K/uL   Basophils Relative 2 %   Basophils Absolute 0.2 (H) 0.0 - 0.1 K/uL   Immature Granulocytes 1 %   Abs Immature Granulocytes 0.14 (H) 0.00 - 0.07 K/uL    Comment: Performed at [facility], [address], [place], [place] [address]  Comprehensive metabolic panel with GFR     Status: Abnormal   Collection Time: 03/10/24  4:03 AM  Result Value Ref Range   Sodium 138 135 - 145 mmol/L   Potassium 4.2 3.5 - 5.1 mmol/L   Chloride 104 98 - 111 mmol/L   CO2 25 22 - 32 mmol/L   Glucose, Bld 115 (H) 70 - 99 mg/dL    Comment: Glucose reference range applies only to samples taken after fasting for at least 8 hours.   BUN 31 (H) 8 - 23 mg/dL   Creatinine, Ser 7.38 (H) 0.61 - 1.24 mg/dL   Calcium  8.5 (L) 8.9 - 10.3 mg/dL   Total Protein 6.5 6.5 - 8.1 g/dL   Albumin  2.7 (L) 3.5 - 5.0 g/dL   AST 19 15 - 41 U/L   ALT 9 0 - 44 U/L   Alkaline Phosphatase 56 38 - 126 U/L   Total Bilirubin 0.5 0.0 - 1.2 mg/dL   GFR,  Estimated 25 (L) >60 mL/min    Comment: (NOTE) Calculated using the CKD-EPI Creatinine Equation (2021)    Anion gap 9 5 - 15    Comment: Performed at [facility], [address], [place], [place] [address]  Magnesium      Status: None   Collection Time: 03/10/24  4:03 AM  Result Value Ref Range   Magnesium  2.3 1.7 - 2.4 mg/dL    Comment: Performed at [facility], [address], [place], [place] [address]  Phosphorus     Status: None   Collection Time: 03/10/24  4:03 AM  Result Value Ref Range   Phosphorus 3.3 2.5 - 4.6 mg/dL    Comment: Performed at [facility], [address] [place], [place] [address]  Prealbumin     Status: Abnormal   Collection Time: 03/10/24  4:03 AM  Result Value Ref Range   Prealbumin 12 (L) 18 -  38 mg/dL    Comment: Performed at [facility], [address], [place], [place] [address]  CBC with Differential/Platelet     Status: Abnormal   Collection Time: 03/11/24  2:59 AM  Result Value Ref Range   WBC 9.2 4.0 - 10.5 K/uL   RBC 3.23 (L) 4.22 - 5.81 MIL/uL   Hemoglobin 8.6 (L) 13.0 - 17.0 g/dL   HCT 71.8 (L) 60.9 - 47.9 %   MCV 87.0 80.0 - 100.0 fL   MCH 26.6 26.0 - 34.0 pg   MCHC 30.6 30.0 - 36.0 g/dL   RDW 84.7 88.4 - 84.4 %   Platelets 245 150 - 400 K/uL   nRBC 0.0 0.0 - 0.2 %   Neutrophils Relative % 65 %   Neutro Abs 5.9 1.7 - 7.7 K/uL   Lymphocytes Relative 22 %   Lymphs Abs 2.0 0.7 - 4.0 K/uL   Monocytes Relative 8 %   Monocytes Absolute 0.7 0.1 - 1.0 K/uL   Eosinophils Relative 2 %   Eosinophils Absolute 0.2 0.0 - 0.5 K/uL   Basophils Relative 2 %   Basophils Absolute 0.2 (H) 0.0 - 0.1 K/uL   Immature Granulocytes 1 %   Abs Immature Granulocytes 0.13 (H) 0.00 - 0.07 K/uL    Comment: Performed at [facility], [address] [place], [place] [address]  Comprehensive metabolic panel with GFR     Status: Abnormal   Collection Time: 03/11/24  2:59 AM  Result Value Ref Range   Sodium 139 135 - 145 mmol/L    Potassium 4.4 3.5 - 5.1 mmol/L   Chloride 102 98 - 111 mmol/L   CO2 24 22 - 32 mmol/L   Glucose, Bld 107 (H) 70 - 99 mg/dL    Comment: Glucose reference range applies only to samples taken after fasting for at least 8 hours.   BUN 25 (H) 8 - 23 mg/dL   Creatinine, Ser 7.59 (H) 0.61 - 1.24 mg/dL   Calcium  8.5 (L) 8.9 - 10.3 mg/dL   Total Protein 6.1 (L) 6.5 - 8.1 g/dL   Albumin  2.5 (L) 3.5 - 5.0 g/dL   AST 18 15 - 41 U/L   ALT 9 0 - 44 U/L   Alkaline Phosphatase 58 38 - 126 U/L   Total Bilirubin 0.5 0.0 - 1.2 mg/dL   GFR, Estimated 27 (L) >60 mL/min    Comment: (NOTE) Calculated using the CKD-EPI Creatinine Equation (2021)    Anion gap 13 5 - 15    Comment: Performed at [facility], [address] [place], [place] [address]  Magnesium      Status: None   Collection Time: 03/11/24  2:59 AM  Result Value Ref Range   Magnesium  2.1 1.7 - 2.4 mg/dL    Comment: Performed at [facility], [address] [place], [place] [address]    Imaging / Studies: CT PELVIS WO CONTRAST Result Date: 03/10/2024 CLINICAL DATA:  History of colovesical fistula repair. Rule out anastomotic leak or recurrent fistula. EXAM: CT CYSTOGRAM (CT PELVIS WITH CONTRAST) TECHNIQUE: Multidetector CT imaging through the pelvis was performed after dilute contrast had been introduced into the bladder for the purposes of performing CT cystography. RADIATION DOSE REDUCTION: This exam was performed according to the departmental dose-optimization program which includes automated exposure control, adjustment of the mA and/or kV according to patient size and/or use of iterative reconstruction technique. CONTRAST:  OMNIPAQUE  IOHEXOL  300 MG/ML  SOLN COMPARISON:  03/07/2024 FINDINGS: Initially, a cystogram was performed with pre and post bladder contrast imaging. Subsequently, after draining the bladder of contrast, rectal contrast was administered. Urinary Tract: The mild left-sided hydronephrosis has resolved.  The mild-to-moderate right-sided hydroureteronephrosis is improved. Foley catheter within the bladder. Intra bladder contrast demonstrates multiple small saccules. Persistent bladder wall thickening. No contrast identified outside the bladder or within the adjacent bowel. Bowel: Surgical sutures within the distal sigmoid. Colonic contrast opacification is moderate within the rectum and distal sigmoid. More proximal contrast not present. The patient removed the enema tip prior to more proximal contrast opacification. No anastomotic leak identified. Scattered colonic diverticula. Diverting right lower quadrant loop ileostomy. Vascular/Lymphatic: Aortic Atherosclerosis. No pelvic sidewall adenopathy. Reproductive:  Mild prostatomegaly. Other:  No extraluminal gas or free pelvic fluid. Musculoskeletal: Presumed bone island in the right ischium. IMPRESSION: 1. Performance of a cystogram followed by a pelvic CT with rectal contrast administration demonstrates no evidence of contrast extravasation or colovesical fistula. 2. Prostatomegaly with bladder wall thickening and saccules, suggesting outlet obstruction. 3. Resolved left and improved right-sided hydroureteronephrosis. Electronically Signed   By: Rockey Kilts M.D.   On: 03/10/2024 17:02   DG Abd Portable 1V Result Date: 03/09/2024 CLINICAL DATA:  Constipation. EXAM: DG ABD PORTABLE 1V COMPARISON:  None Available. FINDINGS: Right lower quadrant ostomy. Scattered gas-filled large and small bowel. No large or small bowel distention. No radiopaque stones. Degenerative changes in the spine and hips. Calcified phleboliths in the pelvis. Vascular calcifications. Lung bases are clear. IMPRESSION: Normal nonobstructive bowel gas pattern. Right lower quadrant ostomy. Electronically Signed   By: Elsie Gravely M.D.   On: 03/09/2024 15:58     Medications / Allergies: per chart  Antibiotics: Anti-infectives (From admission, onward)    Start     Dose/Rate Route  Frequency Ordered Stop   03/10/24 1315  ceFAZolin (ANCEF) IVPB 2g/100 mL premix        2 g 200 mL/hr over 30 Minutes Intravenous Every 12 hours 03/10/24 1221 03/14/24 1759   03/07/24 1800  cefTRIAXone  (ROCEPHIN ) 2 g in sodium chloride  0.9 % 100 mL IVPB  Status:  Discontinued        2 g 200 mL/hr over 30 Minutes Intravenous Every 24 hours 03/07/24 0842 03/10/24 1221   03/07/24 0930  azithromycin (ZITHROMAX) tablet 500 mg  Status:  Discontinued        500 mg Oral Daily 03/07/24 0842 03/09/24 1046   03/06/24 1800  cefTRIAXone  (ROCEPHIN ) 1 g in sodium chloride  0.9 % 100 mL IVPB  Status:  Discontinued        1 g 200 mL/hr over 30 Minutes Intravenous Every 24 hours 03/06/24 1703 03/07/24 0842         Note: Portions of this report may have been transcribed using voice recognition software. Every effort was made to ensure accuracy; however, inadvertent computerized transcription errors may be present.   Any transcriptional errors that result from this process are unintentional.    Elspeth KYM Schultze, MD, FACS, MASCRS Esophageal, Gastrointestinal & Colorectal Surgery Robotic and Minimally Invasive Surgery  [facility] [address], [address] [place], [place] [address] [phone] Fax [phone] Main  CONTACT INFORMATION: Weekday (9AM-5PM): Call [facility] main office at [phone] Weeknight (5PM-9AM) or Weekend/Holiday: Check EPIC Web Links tab & use AMION (password  [email or web address]) for General Surgery [facility] coverage  Please, DO NOT use SecureChat  (it is not reliable communication to reach operating surgeons & will  lead to a delay in care).   Epic staff messaging available for outptient concerns needing 1-2 business day response.      03/11/2024  7:55 AM

## 2024-03-11 NOTE — Progress Notes (Signed)
 Mobility Specialist: Progress Note   03/11/24 1700  Mobility  Activity Ambulated with assistance  Level of Assistance Moderate assist, patient does 50-74%  Assistive Device Front wheel walker  Distance Ambulated (ft) 15 ft  Activity Response Tolerated well  Mobility Referral Yes  Mobility visit 1 Mobility  Mobility Specialist Start Time (ACUTE ONLY) 1015  Mobility Specialist Stop Time (ACUTE ONLY) 1038  Mobility Specialist Time Calculation (min) (ACUTE ONLY) 23 min    Pt received in bed. ModA for bed mobility to assist with trunk elevation. Not very responsive but able to follow commands well this session. ModA for STS and ambulation around the bed to a few feet away from the door. C/o dizziness at rest and during ambulation.VSS on RA. Returned pt to bed and restraints replaced. Left in bed with all needs met, call bell in reach. Bed alarm on.   Ileana Lute Mobility Specialist Please contact via SecureChat or Rehab office at [phone]

## 2024-03-11 NOTE — TOC Progression Note (Addendum)
 Transition of Care [facility]) - Progression Note    Patient Details  Name: Calvin Escobar MRN: [id number] Date of Birth: February 08, 1951  [facility] CM/SW Contact  Inocente GORMAN Kindle, LCSW Phone Number: 03/11/2024, 9:00 AM  Clinical Narrative:    9:00 AM-Pas[facility] still pending. CSW asked [facility] to see if they can accept patient back anyway as the pasrr expired when patient was at their facility; Liaison will ask and let CSW know.   CSW currently on the phone with [facility] to see what the delay is.   9:13 AM-Per [facility], they are having system issues with the publication of pasrr numbers. Representative stated he submitted a ticket to address it but unsure how long it will take.   Per [facility], they are not able to accept patient without a pasrr number.    Expected Discharge Plan: Skilled Nursing Facility Barriers to Discharge: Awaiting State Approval THEONE)               Expected Discharge Plan and Services In-house Referral: Clinical Social Work   Post Acute Care Choice: Skilled Nursing Facility Living arrangements for the past 2 months: Single Family Home, Skilled Nursing Facility                                       Social Drivers of Health (SDOH) Interventions SDOH Screenings   Food Insecurity: Patient Unable To Answer (03/07/2024)  Housing: Unknown (03/07/2024)  Transportation Needs: Patient Unable To Answer (03/07/2024)  Utilities: Not At Risk (03/07/2024)  Alcohol Screen: Low Risk  (10/10/2023)  Depression (PHQ2-9): Low Risk  (12/09/2023)  Recent Concern: Depression (PHQ2-9) - Medium Risk (09/16/2023)  Financial Resource Strain: Low Risk  (10/10/2023)  Physical Activity: Sufficiently Active (10/10/2023)  Social Connections: Moderately Integrated (03/07/2024)  Recent Concern: Social Connections - Socially Isolated (12/11/2023)  Stress: No Stress Concern Present (10/10/2023)  Tobacco Use: Low Risk  (02/05/2024)  Health Literacy: Adequate Health Literacy (10/10/2023)     Readmission Risk Interventions    03/09/2024    3:43 PM 01/14/2024    2:09 PM 07/04/2023    1:27 PM  Readmission Risk Prevention Plan  Transportation Screening Complete  Complete  PCP or Specialist Appt within 5-7 Days   Complete  Home Care Screening   Complete  Medication Review (RN CM)   Complete  Medication Review (RN Care Manager) Complete Complete   PCP or Specialist appointment within 3-5 days of discharge Complete Complete   HRI or Home Care Consult Complete Complete   SW Recovery Care/Counseling Consult Complete Complete   Palliative Care Screening Not Applicable Not Applicable   Skilled Nursing Facility Complete Complete

## 2024-03-11 NOTE — Plan of Care (Signed)
  Problem: Education: Goal: Knowledge of General Education information will improve Description: Including pain rating scale, medication(s)/side effects and non-pharmacologic comfort measures Outcome: Progressing   Problem: Pain Managment: Goal: General experience of comfort will improve and/or be controlled Outcome: Progressing   Problem: Safety: Goal: Ability to remain free from injury will improve Outcome: Progressing   Problem: Skin Integrity: Goal: Risk for impaired skin integrity will decrease Outcome: Progressing   Problem: Safety: Goal: Non-violent Restraint(s) Outcome: Progressing

## 2024-03-11 NOTE — Progress Notes (Signed)
 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 will set up f/u as outpatient to discuss management options.   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   Please contact urology team at DC to set up f/u.   Urology to follow peripherally.

## 2024-03-12 ENCOUNTER — Inpatient Hospital Stay (HOSPITAL_COMMUNITY): Admitting: Anesthesiology

## 2024-03-12 ENCOUNTER — Encounter (HOSPITAL_COMMUNITY): Payer: Self-pay | Admitting: Family Medicine

## 2024-03-12 ENCOUNTER — Encounter: Payer: Self-pay | Admitting: Cardiology

## 2024-03-12 ENCOUNTER — Other Ambulatory Visit: Payer: Self-pay

## 2024-03-12 ENCOUNTER — Encounter: Payer: Self-pay | Admitting: Internal Medicine

## 2024-03-12 ENCOUNTER — Encounter (HOSPITAL_COMMUNITY)
Admission: EM | Disposition: A | Discharge status: Skilled Nursing Facility | Payer: Self-pay | Source: Skilled Nursing Facility | Attending: Family Medicine

## 2024-03-12 DIAGNOSIS — Z432 Encounter for attention to ileostomy: Secondary | ICD-10-CM | POA: Diagnosis not present

## 2024-03-12 DIAGNOSIS — N1831 Chronic kidney disease, stage 3a: Secondary | ICD-10-CM | POA: Diagnosis not present

## 2024-03-12 DIAGNOSIS — I5022 Chronic systolic (congestive) heart failure: Secondary | ICD-10-CM

## 2024-03-12 DIAGNOSIS — I130 Hypertensive heart and chronic kidney disease with heart failure and stage 1 through stage 4 chronic kidney disease, or unspecified chronic kidney disease: Secondary | ICD-10-CM | POA: Diagnosis not present

## 2024-03-12 DIAGNOSIS — N179 Acute kidney failure, unspecified: Secondary | ICD-10-CM | POA: Diagnosis not present

## 2024-03-12 HISTORY — PX: RECTAL EXAM UNDER ANESTHESIA: SHX6399

## 2024-03-12 HISTORY — PX: ILEOSTOMY CLOSURE: SHX1784

## 2024-03-12 LAB — CBC WITH DIFFERENTIAL/PLATELET
Abs Immature Granulocytes: 0.09 K/uL — ABNORMAL HIGH (ref 0.00–0.07)
Basophils Absolute: 0.2 K/uL — ABNORMAL HIGH (ref 0.0–0.1)
Basophils Relative: 2 %
Eosinophils Absolute: 0.2 K/uL (ref 0.0–0.5)
Eosinophils Relative: 2 %
HCT: 30.6 % — ABNORMAL LOW (ref 39.0–52.0)
Hemoglobin: 9.4 g/dL — ABNORMAL LOW (ref 13.0–17.0)
Immature Granulocytes: 1 %
Lymphocytes Relative: 26 %
Lymphs Abs: 2.4 K/uL (ref 0.7–4.0)
MCH: 26.8 pg (ref 26.0–34.0)
MCHC: 30.7 g/dL (ref 30.0–36.0)
MCV: 87.2 fL (ref 80.0–100.0)
Monocytes Absolute: 0.6 K/uL (ref 0.1–1.0)
Monocytes Relative: 6 %
Neutro Abs: 5.8 K/uL (ref 1.7–7.7)
Neutrophils Relative %: 63 %
Platelets: 228 K/uL (ref 150–400)
RBC: 3.51 MIL/uL — ABNORMAL LOW (ref 4.22–5.81)
RDW: 15.3 % (ref 11.5–15.5)
WBC: 9.3 K/uL (ref 4.0–10.5)
nRBC: 0 % (ref 0.0–0.2)

## 2024-03-12 LAB — COMPREHENSIVE METABOLIC PANEL WITH GFR
ALT: 8 U/L (ref 0–44)
AST: 18 U/L (ref 15–41)
Albumin: 2.6 g/dL — ABNORMAL LOW (ref 3.5–5.0)
Alkaline Phosphatase: 59 U/L (ref 38–126)
Anion gap: 13 (ref 5–15)
BUN: 18 mg/dL (ref 8–23)
CO2: 23 mmol/L (ref 22–32)
Calcium: 8.4 mg/dL — ABNORMAL LOW (ref 8.9–10.3)
Chloride: 104 mmol/L (ref 98–111)
Creatinine, Ser: 2.16 mg/dL — ABNORMAL HIGH (ref 0.61–1.24)
GFR, Estimated: 31 mL/min — ABNORMAL LOW (ref >60)
Glucose, Bld: 113 mg/dL — ABNORMAL HIGH (ref 70–99)
Potassium: 4.4 mmol/L (ref 3.5–5.1)
Sodium: 140 mmol/L (ref 135–145)
Total Bilirubin: 0.4 mg/dL (ref 0.0–1.2)
Total Protein: 6.4 g/dL — ABNORMAL LOW (ref 6.5–8.1)

## 2024-03-12 LAB — TYPE AND SCREEN
ABO/RH(D): O POS
Antibody Screen: NEGATIVE

## 2024-03-12 LAB — MAGNESIUM: Magnesium: 1.8 mg/dL (ref 1.7–2.4)

## 2024-03-12 SURGERY — CLOSURE, ILEOSTOMY
Anesthesia: General

## 2024-03-12 MED ORDER — LACTATED RINGERS IV BOLUS: 1000.0000 mL | Freq: Three times a day (TID) | INTRAVENOUS | Status: AC | PRN

## 2024-03-12 MED ORDER — ROCURONIUM BROMIDE 100 MG/10ML IV SOLN
INTRAVENOUS | Status: DC | PRN
  Administered 2024-03-12: 60 mg via INTRAVENOUS
  Administered 2024-03-12 (×2): 10 mg via INTRAVENOUS

## 2024-03-12 MED ORDER — ACETAMINOPHEN 500 MG PO TABS
500.0000 mg | ORAL_TABLET | Freq: Three times a day (TID) | ORAL | Status: DC
  Administered 2024-03-12 – 2024-03-20 (×28): 500 mg via ORAL
  Filled 2024-03-12 (×28): qty 1

## 2024-03-12 MED ORDER — ALVIMOPAN 12 MG PO CAPS
12.0000 mg | ORAL_CAPSULE | Freq: Two times a day (BID) | ORAL | Status: DC
Start: 2024-03-12 — End: 2024-03-12

## 2024-03-12 MED ORDER — CYCLOBENZAPRINE HCL 5 MG PO TABS
5.0000 mg | ORAL_TABLET | Freq: Three times a day (TID) | ORAL | Status: DC | PRN
  Administered 2024-03-20: 10 mg via ORAL
  Filled 2024-03-12: qty 2

## 2024-03-12 MED ORDER — FENTANYL CITRATE (PF) 250 MCG/5ML IJ SOLN
INTRAMUSCULAR | Status: AC
  Filled 2024-03-12: qty 5

## 2024-03-12 MED ORDER — SODIUM CHLORIDE 0.9% FLUSH: 3.0000 mL | INTRAVENOUS | Status: DC | PRN

## 2024-03-12 MED ORDER — ROCURONIUM BROMIDE 10 MG/ML (PF) SYRINGE
PREFILLED_SYRINGE | INTRAVENOUS | Status: AC
  Filled 2024-03-12: qty 10

## 2024-03-12 MED ORDER — ADULT MULTIVITAMIN W/MINERALS CH
1.0000 | ORAL_TABLET | Freq: Every day | ORAL | Status: DC
  Administered 2024-03-12 – 2024-03-20 (×9): 1 via ORAL
  Filled 2024-03-12 (×9): qty 1

## 2024-03-12 MED ORDER — PROPOFOL 1000 MG/100ML IV EMUL
INTRAVENOUS | Status: AC
  Filled 2024-03-12: qty 200

## 2024-03-12 MED ORDER — PROPOFOL 10 MG/ML IV BOLUS
INTRAVENOUS | Status: DC | PRN
  Administered 2024-03-12: 20 ug/kg/min via INTRAVENOUS
  Administered 2024-03-12: 50 mg via INTRAVENOUS

## 2024-03-12 MED ORDER — DEXMEDETOMIDINE HCL IN NACL 80 MCG/20ML IV SOLN
INTRAVENOUS | Status: DC | PRN
  Administered 2024-03-12: 8 ug via INTRAVENOUS

## 2024-03-12 MED ORDER — MAGIC MOUTHWASH: 15.0000 mL | Freq: Four times a day (QID) | ORAL | Status: DC | PRN

## 2024-03-12 MED ORDER — LIDOCAINE 2% (20 MG/ML) 5 ML SYRINGE
INTRAMUSCULAR | Status: DC | PRN
  Administered 2024-03-12: 30 mg via INTRAVENOUS

## 2024-03-12 MED ORDER — PHENYLEPHRINE HCL (PRESSORS) 10 MG/ML IV SOLN
INTRAVENOUS | Status: AC
Start: 2024-03-12 — End: 2024-03-12
  Filled 2024-03-12: qty 1

## 2024-03-12 MED ORDER — ONDANSETRON HCL 4 MG/2ML IJ SOLN
INTRAMUSCULAR | Status: AC
  Filled 2024-03-12: qty 2

## 2024-03-12 MED ORDER — PROPOFOL 10 MG/ML IV BOLUS
INTRAVENOUS | Status: AC
Start: 2024-03-12 — End: 2024-03-12
  Filled 2024-03-12: qty 20

## 2024-03-12 MED ORDER — SODIUM CHLORIDE 0.9 % IV SOLN: 250.0000 mL | INTRAVENOUS | Status: DC | PRN

## 2024-03-12 MED ORDER — OXYCODONE HCL 5 MG/5ML PO SOLN: 5.0000 mg | Freq: Once | ORAL | Status: DC | PRN

## 2024-03-12 MED ORDER — ACETAMINOPHEN 10 MG/ML IV SOLN
1000.0000 mg | Freq: Once | INTRAVENOUS | Status: AC
  Administered 2024-03-12: 1000 mg via INTRAVENOUS
  Filled 2024-03-12: qty 100

## 2024-03-12 MED ORDER — HYDROMORPHONE HCL 1 MG/ML IJ SOLN
INTRAMUSCULAR | Status: AC
  Filled 2024-03-12: qty 0.5

## 2024-03-12 MED ORDER — NAPHAZOLINE-GLYCERIN 0.012-0.25 % OP SOLN
1.0000 [drp] | Freq: Four times a day (QID) | OPHTHALMIC | Status: AC | PRN
Start: 2024-03-12 — End: ?

## 2024-03-12 MED ORDER — ESMOLOL HCL 100 MG/10ML IV SOLN
INTRAVENOUS | Status: DC | PRN
  Administered 2024-03-12 (×2): 30 mg via INTRAVENOUS

## 2024-03-12 MED ORDER — SODIUM CHLORIDE 0.9 % IV SOLN: INTRAVENOUS | Status: DC

## 2024-03-12 MED ORDER — SODIUM CHLORIDE 0.9 % IV SOLN: INTRAVENOUS | Status: DC | PRN

## 2024-03-12 MED ORDER — SODIUM CHLORIDE 0.9 % IV BOLUS
500.0000 mL | Freq: Once | INTRAVENOUS | Status: AC
  Administered 2024-03-12: 500 mL via INTRAVENOUS

## 2024-03-12 MED ORDER — ONDANSETRON HCL 4 MG/2ML IJ SOLN: 4.0000 mg | Freq: Once | INTRAMUSCULAR | Status: DC | PRN

## 2024-03-12 MED ORDER — LORAZEPAM 2 MG/ML IJ SOLN
1.0000 mg | Freq: Once | INTRAMUSCULAR | Status: AC
  Administered 2024-03-12: 1 mg via INTRAVENOUS
  Filled 2024-03-12: qty 1

## 2024-03-12 MED ORDER — LIDOCAINE 2% (20 MG/ML) 5 ML SYRINGE
INTRAMUSCULAR | Status: AC
  Filled 2024-03-12: qty 5

## 2024-03-12 MED ORDER — FENTANYL CITRATE (PF) 100 MCG/2ML IJ SOLN: 25.0000 ug | INTRAMUSCULAR | Status: DC | PRN

## 2024-03-12 MED ORDER — SODIUM CHLORIDE 0.9 % IV SOLN: 2.0000 g | Freq: Two times a day (BID) | INTRAVENOUS | Status: DC

## 2024-03-12 MED ORDER — MENTHOL 3 MG MT LOZG: 1.0000 | LOZENGE | OROMUCOSAL | Status: DC | PRN

## 2024-03-12 MED ORDER — VASOPRESSIN 20 UNIT/ML IV SOLN
INTRAVENOUS | Status: AC
Start: 2024-03-12 — End: 2024-03-12
  Filled 2024-03-12: qty 1

## 2024-03-12 MED ORDER — ACETAMINOPHEN 10 MG/ML IV SOLN
INTRAVENOUS | Status: DC | PRN
Start: 2024-03-12 — End: 2024-03-12
  Administered 2024-03-12: 1000 mg via INTRAVENOUS

## 2024-03-12 MED ORDER — OXYCODONE HCL 5 MG PO TABS: 5.0000 mg | ORAL_TABLET | Freq: Once | ORAL | Status: DC | PRN

## 2024-03-12 MED ORDER — ORAL CARE MOUTH RINSE: 15.0000 mL | Freq: Once | OROMUCOSAL | Status: AC

## 2024-03-12 MED ORDER — BUPIVACAINE-EPINEPHRINE 0.5% -1:200000 IJ SOLN
INTRAMUSCULAR | Status: DC | PRN
  Administered 2024-03-12: 30 mL

## 2024-03-12 MED ORDER — SALINE SPRAY 0.65 % NA SOLN
1.0000 | Freq: Four times a day (QID) | NASAL | Status: AC | PRN
Start: 2024-03-12 — End: ?

## 2024-03-12 MED ORDER — PHENOL 1.4 % MT LIQD: 2.0000 | OROMUCOSAL | Status: DC | PRN

## 2024-03-12 MED ORDER — CHLORHEXIDINE GLUCONATE 0.12 % MT SOLN: 15.0000 mL | Freq: Once | OROMUCOSAL | Status: AC

## 2024-03-12 MED ORDER — PHENYLEPHRINE 80 MCG/ML (10ML) SYRINGE FOR IV PUSH (FOR BLOOD PRESSURE SUPPORT)
PREFILLED_SYRINGE | INTRAVENOUS | Status: AC
  Filled 2024-03-12: qty 10

## 2024-03-12 MED ORDER — SODIUM CHLORIDE 0.9% FLUSH
3.0000 mL | Freq: Two times a day (BID) | INTRAVENOUS | Status: DC
  Administered 2024-03-12 – 2024-03-19 (×14): 3 mL via INTRAVENOUS

## 2024-03-12 MED ORDER — CHLORHEXIDINE GLUCONATE 0.12 % MT SOLN
OROMUCOSAL | Status: AC
  Administered 2024-03-12: 15 mL via OROMUCOSAL
  Filled 2024-03-12: qty 15

## 2024-03-12 MED ORDER — ALVIMOPAN 12 MG PO CAPS
12.0000 mg | ORAL_CAPSULE | Freq: Two times a day (BID) | ORAL | Status: DC
Start: 2024-03-12 — End: 2024-03-18
  Administered 2024-03-12 – 2024-03-17 (×7): 12 mg via ORAL
  Filled 2024-03-12 (×13): qty 1

## 2024-03-12 MED ORDER — SUGAMMADEX SODIUM 200 MG/2ML IV SOLN
INTRAVENOUS | Status: DC | PRN
Start: 2024-03-12 — End: 2024-03-12
  Administered 2024-03-12: 200 mg via INTRAVENOUS

## 2024-03-12 MED ORDER — FENTANYL CITRATE (PF) 250 MCG/5ML IJ SOLN
INTRAMUSCULAR | Status: DC | PRN
  Administered 2024-03-12 (×6): 50 ug via INTRAVENOUS

## 2024-03-12 MED ORDER — ACETAMINOPHEN 10 MG/ML IV SOLN
INTRAVENOUS | Status: AC
  Filled 2024-03-12: qty 100

## 2024-03-12 MED ORDER — HYDROMORPHONE HCL 1 MG/ML IJ SOLN
INTRAMUSCULAR | Status: DC | PRN
  Administered 2024-03-12: .25 mg via INTRAVENOUS

## 2024-03-12 MED ORDER — SODIUM CHLORIDE 0.9 % IV SOLN: INTRAVENOUS | Status: AC

## 2024-03-12 MED ORDER — ENSURE PLUS HIGH PROTEIN PO LIQD
237.0000 mL | Freq: Three times a day (TID) | ORAL | Status: DC
  Administered 2024-03-13 – 2024-03-20 (×20): 237 mL via ORAL

## 2024-03-12 MED ORDER — ONDANSETRON HCL 4 MG/2ML IJ SOLN
INTRAMUSCULAR | Status: DC | PRN
Start: 2024-03-12 — End: 2024-03-12
  Administered 2024-03-12: 4 mg via INTRAVENOUS

## 2024-03-12 MED ORDER — TRAMADOL HCL 50 MG PO TABS
50.0000 mg | ORAL_TABLET | Freq: Four times a day (QID) | ORAL | Status: DC | PRN
  Administered 2024-03-16: 50 mg via ORAL
  Administered 2024-03-18 – 2024-03-19 (×2): 100 mg via ORAL
  Filled 2024-03-12: qty 2
  Filled 2024-03-12: qty 1
  Filled 2024-03-12: qty 2

## 2024-03-12 SURGICAL SUPPLY — 65 items
BAG COUNTER SPONGE SURGICOUNT (BAG) ×1 IMPLANT
BLADE CLIPPER SURG (BLADE) IMPLANT
CANISTER SUCTION 3000ML PPV (SUCTIONS) ×1 IMPLANT
CELLS DAT CNTRL 66122 CELL SVR (MISCELLANEOUS) ×1 IMPLANT
COVER MAYO STAND STRL (DRAPES) ×1 IMPLANT
COVER SURGICAL LIGHT HANDLE (MISCELLANEOUS) ×2 IMPLANT
DRAPE UTILITY XL STRL (DRAPES) ×1 IMPLANT
DRSG OPSITE POSTOP 4X10 (GAUZE/BANDAGES/DRESSINGS) IMPLANT
DRSG OPSITE POSTOP 4X8 (GAUZE/BANDAGES/DRESSINGS) IMPLANT
ELECT CAUTERY BLADE 6.4 (BLADE) ×2 IMPLANT
ELECTRODE REM PT RTRN 9FT ADLT (ELECTROSURGICAL) ×1 IMPLANT
GAUZE 4X4 16PLY ~~LOC~~+RFID DBL (SPONGE) ×1 IMPLANT
GAUZE SPONGE 4X4 12PLY STRL (GAUZE/BANDAGES/DRESSINGS) ×1 IMPLANT
GEL ULTRASOUND 20GR AQUASONIC (MISCELLANEOUS) IMPLANT
GLOVE BIO SURGEON STRL SZ7.5 (GLOVE) ×4 IMPLANT
GLOVE BIO SURGEON STRL SZ8 (GLOVE) ×2 IMPLANT
GLOVE ECLIPSE 7.5 STRL STRAW (GLOVE) ×1 IMPLANT
GLOVE INDICATOR 8.0 STRL GRN (GLOVE) ×1 IMPLANT
GOWN STRL REUS W/ TWL LRG LVL3 (GOWN DISPOSABLE) ×4 IMPLANT
GOWN STRL REUS W/ TWL XL LVL3 (GOWN DISPOSABLE) ×2 IMPLANT
KIT BASIN OR (CUSTOM PROCEDURE TRAY) ×1 IMPLANT
KIT SIGMOIDOSCOPE (SET/KITS/TRAYS/PACK) IMPLANT
KIT TURNOVER KIT B (KITS) ×1 IMPLANT
NDL 22X1.5 STRL (OR ONLY) (MISCELLANEOUS) ×1 IMPLANT
NDL HYPO 25GX1X1/2 BEV (NEEDLE) ×1 IMPLANT
NEEDLE 22X1.5 STRL (OR ONLY) (MISCELLANEOUS) ×1 IMPLANT
NEEDLE HYPO 25GX1X1/2 BEV (NEEDLE) ×1 IMPLANT
PACK COLON (CUSTOM PROCEDURE TRAY) ×1 IMPLANT
PACK LITHOTOMY IV (CUSTOM PROCEDURE TRAY) ×1 IMPLANT
PAD ARMBOARD POSITIONER FOAM (MISCELLANEOUS) ×2 IMPLANT
PENCIL BUTTON HOLSTER BLD 10FT (ELECTRODE) ×1 IMPLANT
PENCIL SMOKE EVACUATOR (MISCELLANEOUS) ×1 IMPLANT
RETRACTOR WND ALEXIS 18 MED (MISCELLANEOUS) IMPLANT
SCISSORS LAP 5X35 DISP (ENDOMECHANICALS) IMPLANT
SET TUBE SMOKE EVAC HIGH FLOW (TUBING) ×1 IMPLANT
SHEARS HARMONIC 36 ACE (MISCELLANEOUS) IMPLANT
SHEARS HARMONIC 9CM CVD (BLADE) IMPLANT
SLEEVE Z-THREAD 5X100MM (TROCAR) IMPLANT
SOL ANTI FOG 6CC (MISCELLANEOUS) ×1 IMPLANT
SOLN 0.9% NACL 1000 ML (IV SOLUTION) ×2 IMPLANT
SOLN 0.9% NACL POUR BTL 1000ML (IV SOLUTION) ×2 IMPLANT
SPECIMEN JAR SMALL (MISCELLANEOUS) ×1 IMPLANT
SPONGE SURGIFOAM ABS GEL 100 (HEMOSTASIS) IMPLANT
SPONGE T-LAP 18X18 ~~LOC~~+RFID (SPONGE) IMPLANT
STAPLER 90 3.5 STD SLIM (STAPLE) IMPLANT
STAPLER PROXIMATE 75MM BLUE (STAPLE) IMPLANT
STAPLER SKIN PROX 35W (STAPLE) ×1 IMPLANT
SURGILUBE 2OZ TUBE FLIPTOP (MISCELLANEOUS) ×1 IMPLANT
SUT CHROMIC 2 0 SH (SUTURE) IMPLANT
SUT CHROMIC 3 0 SH 27 (SUTURE) ×1 IMPLANT
SUT PDS AB 1 TP1 96 (SUTURE) ×2 IMPLANT
SUT PROLENE 2 0 CT2 30 (SUTURE) IMPLANT
SUT PROLENE 2 0 KS (SUTURE) IMPLANT
SUT SILK 2 0 SH CR/8 (SUTURE) ×1 IMPLANT
SUT SILK 2 0 TIES 10X30 (SUTURE) ×1 IMPLANT
SUT SILK 3 0 SH CR/8 (SUTURE) ×1 IMPLANT
SUT SILK 3 0 TIES 10X30 (SUTURE) ×1 IMPLANT
SUT VIC AB 2-0 UR6 27 (SUTURE) IMPLANT
SYR CONTROL 10ML LL (SYRINGE) ×1 IMPLANT
TOWEL GREEN STERILE FF (TOWEL DISPOSABLE) ×1 IMPLANT
TRAY FOLEY MTR SLVR 16FR STAT (SET/KITS/TRAYS/PACK) ×1 IMPLANT
TROCAR Z-THREAD OPTICAL 5X100M (TROCAR) ×1 IMPLANT
TUBE CONNECTING 12X1/4 (SUCTIONS) ×2 IMPLANT
UNDERPAD 30X36 HEAVY ABSORB (UNDERPADS AND DIAPERS) ×1 IMPLANT
YANKAUER SUCT BULB TIP NO VENT (SUCTIONS) ×1 IMPLANT

## 2024-03-12 NOTE — Interval H&P Note (Signed)
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 post bladder contrast imaging. Subsequently, after draining the bladder of contrast, rectal contrast was administered. Urinary Tract: The mild left-sided hydronephrosis has resolved. The mild-to-moderate right-sided hydroureteronephrosis is improved. Foley catheter within the bladder. Intra bladder contrast demonstrates multiple small saccules. Persistent bladder wall thickening. No contrast identified outside the bladder or within the adjacent bowel. Bowel: Surgical sutures within the distal sigmoid. Colonic contrast opacification is moderate within the rectum and distal sigmoid. More proximal contrast not present. The patient removed the enema tip prior to more proximal contrast opacification. No anastomotic leak identified. Scattered colonic diverticula. Diverting right lower quadrant loop ileostomy. Vascular/Lymphatic: Aortic Atherosclerosis. No pelvic sidewall adenopathy. Reproductive:  Mild prostatomegaly. Other:  No extraluminal gas or free pelvic fluid. Musculoskeletal: Presumed bone island in the right ischium. IMPRESSION: 1. Performance of a  cystogram followed by a pelvic CT with rectal contrast administration demonstrates no evidence of contrast extravasation or colovesical fistula. 2. Prostatomegaly with bladder wall thickening and saccules, suggesting outlet obstruction. 3. Resolved left and improved right-sided hydroureteronephrosis. Electronically Signed   By: Rockey Kilts M.D.   On: 03/10/2024 17:02   DG Abd Portable 1V Result Date: 03/09/2024 CLINICAL DATA:  Constipation. EXAM: DG ABD PORTABLE 1V COMPARISON:  None Available. FINDINGS: Right lower quadrant ostomy. Scattered gas-filled large and small bowel. No large or small bowel distention. No radiopaque stones. Degenerative changes in the spine and hips. Calcified phleboliths in the pelvis. Vascular calcifications. Lung bases are clear. IMPRESSION: Normal nonobstructive bowel gas pattern. Right lower quadrant ostomy. Electronically Signed   By: Elsie Gravely M.D.   On: 03/09/2024 15:58   CT ABDOMEN PELVIS WO CONTRAST Result Date: 03/07/2024 CLINICAL DATA:  Abdominal pain, acute, nonlocalized History of colovesical fistula repair, diverting ileostomy, now with AKI and hydronephrosis despite having Foley EXAM: CT ABDOMEN AND PELVIS WITHOUT CONTRAST TECHNIQUE: Multidetector CT imaging of the abdomen and pelvis was performed following the standard protocol without IV contrast. RADIATION DOSE REDUCTION: This exam was performed according to the departmental dose-optimization program which includes automated exposure control, adjustment of the mA and/or kV according to patient size and/or use of iterative reconstruction technique. COMPARISON:  Ultrasound renal 03/06/2024 FINDINGS: Lower chest: Cardiac changes suggestive of anemia. Hepatobiliary: No focal liver abnormality. No gallstones, gallbladder wall thickening, or pericholecystic fluid. No biliary dilatation. Pancreas: Diffusely atrophic. No focal lesion. Otherwise normal pancreatic contour. No surrounding inflammatory changes. No main  pancreatic ductal dilatation. Spleen: Normal in size without focal abnormality. Adrenals/Urinary Tract: No adrenal nodule bilaterally. Bilateral moderate hydroureteronephrosis. No nephroureterolithiasis bilaterally. Decompressed urinary bladder with circumferential urinary bladder wall thickening. Foley  catheter in appropriate position. Gas noted within the urinary bladder lumen and left renal calyx (3:31). Stomach/Bowel: Stomach is within normal limits. No evidence of small bowel wall thickening or dilatation. Question slight wall thickening and irregularity of the rectum. Right lower quadrant diverting ileostomy noted. The appendix is not definitely identified with no inflammatory changes in the right lower quadrant to suggest acute appendicitis. Vascular/Lymphatic: No abdominal aorta or iliac aneurysm. Mild atherosclerotic plaque of the aorta and its branches. No abdominal, pelvic, or inguinal lymphadenopathy. Reproductive: Prostate is enlarged measuring up to 4.8 cm. Other: No intraperitoneal free fluid. No intraperitoneal free gas. No organized fluid collection. Musculoskeletal: No abdominal wall hernia or abnormality. No suspicious lytic or blastic osseous lesions. No acute displaced fracture. Multilevel degenerative changes of the spine. IMPRESSION: 1. Circumferential urinary bladder wall thickening with associated bilateral moderate hydroureteronephrosis. Markedly limited evaluation on this noncontrast study. Underlying malignancy or infection are not excluded. 2. Gas within the collecting system may be due to Foley catheter placement. With differential diagnosis including emphysematous pyelitis. 3. Question slight wall thickening and irregularity of the rectum. Markedly limited evaluation on this noncontrast study. Underlying proctitis not excluded. 4. Prostatomegaly. 5. Right lower quadrant diverting ileostomy. 6. Colonic diverticulosis with no acute diverticulitis. 7.  Aortic Atherosclerosis  (ICD10-I70.0). Electronically Signed   By: Morgane  Naveau M.D.   On: 03/07/2024 10:47   DG Chest Port 1 View Result Date: 03/07/2024 CLINICAL DATA:  Shortness of breath. EXAM: PORTABLE CHEST 1 VIEW COMPARISON:  03/06/2024 FINDINGS: Low volume film. Cardiopericardial silhouette is at upper limits of normal for size. Interstitial markings are diffusely coarsened with chronic features. Small nodular density in the peripheral right mid lung is stable. Subtle nodular density in the left mid lung may reflect a confluence of shadows. No dense focal airspace consolidation. No substantial pleural effusion. Skin folds are seen over the upper lungs bilaterally. Telemetry leads overlie the chest. IMPRESSION: 1. Low volume film with chronic interstitial coarsening. 2. Subtle nodular density in the left mid lung may reflect a confluence of shadows. Pneumonia or underlying soft tissue lesion not excluded. Attention on follow-up recommended. 3. Calcified granuloma again noted right lung. Electronically Signed   By: Camellia Candle M.D.   On: 03/07/2024 07:40   US  RENAL Result Date: 03/06/2024 CLINICAL DATA:  [id number] AKI (acute kidney injury) [id number] EXAM: RENAL / URINARY TRACT ULTRASOUND COMPLETE COMPARISON:  02/05/2024, 09/28/2023 FINDINGS: Right Kidney: Renal measurements: 12.1 x 6.1 x 8.2 cm = volume: 313 mL.Normal echogenicity. No mass. Redemonstrated moderate to severe hydroureteronephrosis. No nephrolithiasis. Left Kidney: Renal measurements: 11.8 x 6.5 x 5.8 cm = volume: 234 mL. Normal echogenicity. No mass. Mild hydronephrosis. No nephrolithiasis. Bladder: Similar trabeculation of the urinary bladder wall. Other: None. IMPRESSION: 1. Similarly appearing bilateral hydronephrosis, moderate to severe on the right. 2. Trabeculation of the urinary bladder wall, which may be due to chronic bladder outlet obstruction. Electronically Signed   By: Rogelia Myers M.D.   On: 03/06/2024 17:14   DG Chest Port 1 View Result Date:  03/06/2024 CLINICAL DATA:  Shortness of breath, altered mental status and weakness. EXAM: PORTABLE CHEST 1 VIEW COMPARISON:  [id number] FINDINGS: The heart size and mediastinal contours are within normal limits. Very low bilateral lung volumes with bibasilar atelectasis. There is no evidence of pulmonary edema, consolidation, pneumothorax or pleural fluid. Stable calcified granuloma in the right lung. The visualized skeletal structures are unremarkable. IMPRESSION: Very low bilateral lung volumes with bibasilar atelectasis. Electronically Signed   By: Marcey  Luverne M.D.   On: 03/06/2024 14:51     .Calvin Escobar, M.D., F.A.C.S. Gastrointestinal and Minimally Invasive Surgery [facility] [address] [place], [place] [address] [phone] Main / Paging  03/12/2024 3:18 PM    Calvin Escobar

## 2024-03-12 NOTE — [place] FL2 (Signed)
 [place]  MEDICAID FL2 LEVEL OF CARE FORM     IDENTIFICATION  Patient Name: Calvin Escobar Birthdate: 09-Jul-1950 Sex: male Admission Date (Current Location): 03/06/2024  [facility] and [place] Number:  Producer, television/film/video and Address:  [facility] [place], [address], [place], [place] [address]      Provider Number: [id number]  Attending Physician Name and Address:  Royal Sill, MD  Relative Name and Phone Number:       Current Level of Care: Hospital Recommended Level of Care: Skilled Nursing Facility Prior Approval Number:    Date Approved/Denied:   PASRR Number: [id number] E expires 04/11/2024  Discharge Plan: SNF    Current Diagnoses: Patient Active Problem List   Diagnosis Date Noted   Acute kidney injury superimposed on stage 3a chronic kidney disease (HCC) with hyperkalemia 03/06/2024   Acute encephalopathy 03/06/2024   Chronic hyponatremia 03/06/2024   Acute kidney injury 02/05/2024   Malnutrition of moderate degree 01/14/2024   OSA on CPAP 01/14/2024   Diverticulitis of sigmoid colon s/p robotic colectomy 01/09/2024 01/14/2024   Ileostomy in place [facility]) 01/11/2024   History of COVID-19 01/09/2024   History of urinary retention 01/09/2024   Chronic indwelling Foley catheter 01/09/2024   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   Chronic HFrEF (heart failure with reduced ejection fraction) (HCC) 12/13/2023   Hematuria 12/02/2023   Medication management 11/05/2023   Hydronephrosis, right 09/28/2023   Prostatic mass 09/28/2023   Weight loss, abnormal 08/14/2023   Hypocalcemia 08/14/2023   Hypoalbuminemia 08/14/2023   GERD (gastroesophageal reflux disease) 07/13/2023   Recurrent UTI 07/01/2023   LBBB (left bundle branch block) 06/02/2023   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   Acute kidney injury superimposed on chronic kidney disease 05/13/2023   Obesity (BMI 30-39.9) 05/13/2023   Tremor 05/08/2023   Lack of appetite 05/08/2023    Chronic kidney disease, stage 3a (HCC) 03/07/2023   Anemia of chronic disease 03/07/2023   Limp 12/19/2022   Gynecomastia, male 12/19/2022   Chronic back pain 12/19/2022   Hypertension 11/21/2022   Generalized arthritis 07/18/2022   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   DCM (dilated cardiomyopathy) (HCC)    OSA treated with BiPAP    Primary insomnia 08/05/2017   Dementia (HCC)    HLD (hyperlipidemia)    Hyperkalemia    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   BPH (benign prostatic hyperplasia) 03/27/2012   Morbid obesity (HCC) 03/27/2012   Bipolar affective disorder, depressed (HCC) 05/10/2007   Chronic bronchitis (HCC) 05/10/2007    Orientation RESPIRATION BLADDER Height & Weight     Self, Place  Normal Indwelling catheter, Continent Weight: 198 lb 13.7 oz (90.2 kg) Height:  6' (182.9 cm)  BEHAVIORAL SYMPTOMS/MOOD NEUROLOGICAL BOWEL NUTRITION STATUS      Incontinent, Ileostomy Diet (See dc summary)  AMBULATORY STATUS COMMUNICATION OF NEEDS Skin   Extensive Assist Verbally Other (Comment) (wounds on perineum, penis, scrotum)                       Personal Care Assistance Level of Assistance  Bathing, Feeding, Dressing Bathing Assistance: Maximum assistance Feeding assistance: Limited assistance Dressing Assistance: Maximum assistance     Functional Limitations Info  Sight, Hearing Sight Info: Impaired Hearing Info: Impaired      SPECIAL CARE FACTORS FREQUENCY  PT (By licensed PT), OT (By licensed OT)     PT Frequency: 5x/week OT Frequency: 5x/week            Contractures Contractures  Info: Not present    Additional Factors Info  Code Status, Allergies Code Status Info: Full Allergies Info: Albuterol            Current Medications (03/12/2024):  This is the current hospital active medication list Current Facility-Administered Medications  Medication Dose Route Frequency Provider Last Rate Last Admin   0.9 %  sodium chloride  infusion    Intravenous Continuous Segars, Jonathan, MD 100 mL/hr at 03/12/24 0151 New Bag at 03/12/24 0151   acetaminophen  (TYLENOL ) tablet 1,000 mg  1,000 mg Oral On Call to OR Sheldon Standing, MD       alum & mag hydroxide-simeth (MAALOX/MYLANTA) 200-200-20 MG/5ML suspension 30 mL  30 mL Oral Q6H PRN Singh, Prashant K, MD   30 mL at 03/09/24 1349   alvimopan  (ENTEREG ) capsule 12 mg  12 mg Oral On Call to OR Sheldon Standing, MD       busPIRone  (BUSPAR ) tablet 10 mg  10 mg Oral TID Singh, Prashant K, MD   10 mg at 03/12/24 0940   carvedilol  (COREG ) tablet 3.125 mg  3.125 mg Oral BID WC Singh, Prashant K, MD   3.125 mg at 03/12/24 0940   ceFAZolin (ANCEF) IVPB 2g/100 mL premix  2 g Intravenous Q12H Singh, Prashant K, MD 200 mL/hr at 03/12/24 0558 2 g at 03/12/24 0558   cefoTEtan  (CEFOTAN ) 2 g in sodium chloride  0.9 % 100 mL IVPB  2 g Intravenous On Call to OR Sheldon Standing, MD       Chlorhexidine  Gluconate Cloth 2 % PADS 6 each  6 each Topical Daily Singh, Prashant K, MD   6 each at 03/12/24 0941   Chlorhexidine  Gluconate Cloth 2 % PADS 6 each  6 each Topical Once Sheldon Standing, MD       ferrous sulfate  tablet 325 mg  325 mg Oral BID WC Singh, Prashant K, MD   325 mg at 03/12/24 0940   finasteride  (PROSCAR ) tablet 5 mg  5 mg Oral Daily Singh, Prashant K, MD   5 mg at 03/12/24 0940   folic acid  (FOLVITE ) tablet 1 mg  1 mg Oral Daily Singh, Prashant K, MD   1 mg at 03/12/24 0940   gabapentin  (NEURONTIN ) capsule 200 mg  200 mg Oral On Call to OR Sheldon Standing, MD       haloperidol  lactate (HALDOL ) injection 2 mg  2 mg Intramuscular Q6H PRN Singh, Prashant K, MD   2 mg at 03/11/24 2353   heparin  injection 5,000 Units  5,000 Units Subcutaneous Q8H Waddell Rake, MD   5,000 Units at 03/12/24 0533   loperamide  (IMODIUM ) capsule 2 mg  2 mg Oral Q6H PRN Sheldon Standing, MD       melatonin tablet 5 mg  5 mg Oral QHS Singh, Prashant K, MD   5 mg at 03/11/24 2023   metoprolol  tartrate (LOPRESSOR ) injection 2.5 mg  2.5 mg  Intravenous Q6H PRN Waddell Rake, MD       ondansetron  (ZOFRAN ) tablet 4 mg  4 mg Oral Q6H PRN Waddell Rake, MD       Or   ondansetron  (ZOFRAN ) injection 4 mg  4 mg Intravenous Q6H PRN Waddell Rake, MD       pantoprazole  (PROTONIX ) EC tablet 40 mg  40 mg Oral Daily Singh, Prashant K, MD   40 mg at 03/12/24 0940   polycarbophil (FIBERCON) tablet 625 mg  625 mg Oral BID Sheldon Standing, MD   625 mg at 03/12/24 0940   QUEtiapine (  SEROQUEL) tablet 25 mg  25 mg Oral Q0600 Singh, Prashant K, MD   25 mg at 03/12/24 0533   QUEtiapine (SEROQUEL) tablet 50 mg  50 mg Oral QHS Singh, Prashant K, MD   50 mg at 03/11/24 2240   tamsulosin  (FLOMAX ) capsule 0.4 mg  0.4 mg Oral Daily Singh, Prashant K, MD   0.4 mg at 03/12/24 0940     Discharge Medications: Please see discharge summary for a list of discharge medications.  Relevant Imaging Results:  Relevant Lab Results:   Additional Information ss#[id number] [place], [place]

## 2024-03-12 NOTE — Transfer of Care (Signed)
 Immediate Anesthesia Transfer of Care Note  Patient: Calvin Escobar  Procedure(s) Performed: CLOSURE, ILEOSTOMY EXAM UNDER ANESTHESIA, RECTUM  Patient Location: PACU  Anesthesia Type:General  Level of Consciousness: awake and alert   Airway & Oxygen Therapy: Patient Spontanous Breathing and Patient connected to face mask oxygen  Post-op Assessment: Report given to RN and Post -op Vital signs reviewed and stable  Post vital signs: Reviewed and stable  Last Vitals:  Vitals Value Taken Time  BP 111/77 03/12/24 17:45  Temp 36.5 C 03/12/24 17:45  Pulse 78 03/12/24 17:47  Resp 13 03/12/24 17:47  SpO2 96 % 03/12/24 17:47  Vitals shown include unfiled device data.  Last Pain:  Vitals:   03/12/24 1516  TempSrc: Oral  PainSc: 0-No pain      Patients Stated Pain Goal: 0 (03/10/24 1108)  Complications: No notable events documented.

## 2024-03-12 NOTE — Discharge Instructions (Signed)
 SURGERY: POST OP INSTRUCTIONS (Surgery for small bowel obstruction, colon resection, etc)   ######################################################################  EAT Gradually transition to a high fiber diet with a fiber supplement over the next few days after discharge  WALK Walk an hour a day.  Control your pain to do that.    CONTROL PAIN Control pain so that you can walk, sleep, tolerate sneezing/coughing, go up/down stairs.  HAVE A BOWEL MOVEMENT DAILY Keep your bowels regular to avoid problems.  OK to try a laxative to override constipation.  OK to use an antidairrheal to slow down diarrhea.  Call if not better after 2 tries  CALL IF YOU HAVE PROBLEMS/CONCERNS Call if you are still struggling despite following these instructions. Call if you have concerns not answered by these instructions  ######################################################################   DIET Follow a light diet the first few days at home.  Start with a bland diet such as soups, liquids, starchy foods, low fat foods, etc.  If you feel full, bloated, or constipated, stay on a ful liquid or pureed/blenderized diet for a few days until you feel better and no longer constipated. Be sure to drink plenty of fluids every day to avoid getting dehydrated (feeling dizzy, not urinating, etc.). Gradually add a fiber supplement to your diet over the next week.  Gradually get back to a regular solid diet.  Avoid fast food or heavy meals the first week as you are more likely to get nauseated. It is expected for your digestive tract to need a few months to get back to normal.  It is common for your bowel movements and stools to be irregular.  You will have occasional bloating and cramping that should eventually fade away.  Until you are eating solid food normally, off all pain medications, and back to regular activities; your bowels will not be normal. Focus on eating a low-fat, high fiber diet the rest of your life  (See Getting to Good Bowel Health, below).  CARE of your INCISION or WOUND  It is good for closed incisions and even open wounds to be washed every day.  Shower every day.  Short baths are fine.  Wash the incisions and wounds clean with soap & water .    You may leave closed incisions open to air if it is dry.   You may cover the incision with clean gauze & replace it after your daily shower for comfort.  TEGADERM & WICKS:  You have clear gauze band-aid dressings over your closed incision(s).  Remove the dressings & shoelace ribbon wicks in your largest incision 2 days after surgery = 10/11.    If you have an open wound with a wound vac, see wound vac care instructions.    ACTIVITIES as tolerated Start light daily activities --- self-care, walking, climbing stairs-- beginning the day after surgery.  Gradually increase activities as tolerated.  Control your pain to be active.  Stop when you are tired.  Ideally, walk several times a day, eventually an hour a day.   Most people are back to most day-to-day activities in a few weeks.  It takes 4-8 weeks to get back to unrestricted, intense activity. If you can walk 30 minutes without difficulty, it is safe to try more intense activity such as jogging, treadmill, bicycling, low-impact aerobics, swimming, etc. Save the most intensive and strenuous activity for last (Usually 4-8 weeks after surgery) such as sit-ups, heavy lifting, contact sports, etc.  Refrain from any intense heavy lifting or straining until you are  off narcotics for pain control.  You will have off days, but things should improve week-by-week. DO NOT PUSH THROUGH PAIN.  Let pain be your guide: If it hurts to do something, don't do it.  Pain is your body warning you to avoid that activity for another week until the pain goes down. You may drive when you are no longer taking narcotic prescription pain medication, you can comfortably wear a seatbelt, and you can safely make sudden  turns/stops to protect yourself without hesitating due to pain. You may have sexual intercourse when it is comfortable. If it hurts to do something, stop.   MEDICATIONS Take your usually prescribed home medications unless otherwise directed.    Blood thinners:  You can restart any strong blood thinners after the second postoperative day  for example: COUMADIN (warfarin), XERELTO (rivaroxaban), ELIQUIS (apixaban), PLAVIX (clopidigrel), BRILINTA (ticagrelor), EFFIENT (prasugrel), PRADAXA (dabigatran), etc  Continue aspirin  before & after surgery..     Some oozing/bleeding the first 1-2 weeks is common but should taper down & be small volume.    If you are passing many large clots or having uncontrolling bleeding, call your surgeon    PAIN CONTROL Pain after surgery or related to activity is often due to strain/injury to muscle, tendon, nerves and/or incisions.  This pain is usually short-term and will improve in a few months.  To help speed the process of healing and to get back to regular activity more quickly, DO THE FOLLOWING THINGS TOGETHER: Increase activity gradually.  DO NOT PUSH THROUGH PAIN Use Ice and/or Heat Try Gentle Massage and/or Stretching Take over the counter pain medication Take Narcotic prescription pain medication for more severe pain  Good pain control = faster recovery.  It is better to take more medicine to be more active than to stay in bed all day to avoid medications.  Increase activity gradually Avoid heavy lifting at first, then increase to lifting as tolerated over the next 6 weeks. Do not "push through" the pain.  Listen to your body and avoid positions and maneuvers than reproduce the pain.  Wait a few days before trying something more intense Walking an hour a day is encouraged to help your body recover faster and more safely.  Start slowly and stop when getting sore.  If you can walk 30 minutes without stopping or pain, you can try more intense activity  (running, jogging, aerobics, cycling, swimming, treadmill, sex, sports, weightlifting, etc.) Remember: If it hurts to do it, then don't do it! Use Ice and/or Heat You will have swelling and bruising around the incisions.  This will take several weeks to resolve. Ice packs or heating pads (6-8 times a day, 30-60 minutes at a time) will help sooth soreness & bruising. Some people prefer to use ice alone, heat alone, or alternate between ice & heat.  Experiment and see what works best for you.  Consider trying ice for the first few days to help decrease swelling and bruising; then, switch to heat to help relax sore spots and speed recovery. Shower every day.  Short baths are fine.  It feels good!  Keep the incisions and wounds clean with soap & water .   Try Gentle Massage and/or Stretching Massage at the area of pain many times a day Stop if you feel pain - do not overdo it Take over the counter pain medication This helps the muscle and nerve tissues become less irritable and calm down faster Choose ONE of the following over-the-counter anti-inflammatory  medications: Acetaminophen  500mg  tabs (Tylenol ) 1-2 pills with every meal and just before bedtime (avoid if you have liver problems or if you have acetaminophen  in you narcotic prescription) Naproxen 220mg  tabs (ex. Aleve, Naprosyn) 1-2 pills twice a day (avoid if you have kidney, stomach, IBD, or bleeding problems) Ibuprofen 200mg  tabs (ex. Advil, Motrin) 3-4 pills with every meal and just before bedtime (avoid if you have kidney, stomach, IBD, or bleeding problems) Take with food/snack several times a day as directed for at least 2 weeks to help keep pain / soreness down & more manageable. Take Narcotic prescription pain medication for more severe pain A prescription for strong pain control is often given to you upon discharge (for example: oxycodone /Percocet, hydrocodone /Norco/Vicodin, or tramadol /Ultram ) Take your pain medication as  prescribed. Be mindful that most narcotic prescriptions contain Tylenol  (acetaminophen ) as well - avoid taking too much Tylenol . If you are having problems/concerns with the prescription medicine (does not control pain, nausea, vomiting, rash, itching, etc.), please call us  [phone] to see if we need to switch you to a different pain medicine that will work better for you and/or control your side effects better. If you need a refill on your pain medication, you must call the office before 4 pm and on weekdays only.  By federal law, prescriptions for narcotics cannot be called into a pharmacy.  They must be filled out on paper & picked up from our office by the patient or authorized caretaker.  Prescriptions cannot be filled after 4 pm nor on weekends.    WHEN TO CALL US  ([phone] Severe uncontrolled or worsening pain  Fever over 101 F (38.5 C) Concerns with the incision: Worsening pain, redness, rash/hives, swelling, bleeding, or drainage Reactions / problems with new medications (itching, rash, hives, nausea, etc.) Nausea and/or vomiting Difficulty urinating Difficulty breathing Worsening fatigue, dizziness, lightheadedness, blurred vision Other concerns If you are not getting better after two weeks or are noticing you are getting worse, contact our office ([phone] for further advice.  We may need to adjust your medications, re-evaluate you in the office, send you to the emergency room, or see what other things we can do to help. The clinic staff is available to answer your questions during regular business hours (8:30am-5pm).  Please don't hesitate to call and ask to speak to one of our nurses for clinical concerns.    A surgeon from [facility] is always on call at the hospitals 24 hours/day If you have a medical emergency, go to the nearest emergency room or call 911.  FOLLOW UP in our office One the day of your discharge from the hospital (or the next business  weekday), please call [facility] to set up or confirm an appointment to see your surgeon in the office for a follow-up appointment.  Usually it is 2-3 weeks after your surgery.   If you have skin staples at your incision(s), let the office know so we can set up a time in the office for the nurse to remove them (usually around 10 days after surgery). Make sure that you call for appointments the day of discharge (or the next business weekday) from the hospital to ensure a convenient appointment time. IF YOU HAVE DISABILITY OR FAMILY LEAVE FORMS, BRING THEM TO THE OFFICE FOR PROCESSING.  DO NOT GIVE THEM TO YOUR DOCTOR.  [facility] [address], [place], [place]  [address] ? [phone] - Main [phone] -  Toll Free,  [phone] - Fax [email or web address]    GETTING TO GOOD BOWEL HEALTH. It is expected for your digestive tract to need a few months to get back to normal.  It is common for your bowel movements and stools to be irregular.  You will have occasional bloating and cramping that should eventually fade away.  Until you are eating solid food normally, off all pain medications, and back to regular activities; your bowels will not be normal.   Avoiding constipation The goal: ONE SOFT BOWEL MOVEMENT A DAY!    Drink plenty of fluids.  Choose water  first. TAKE A FIBER SUPPLEMENT EVERY DAY THE REST OF YOUR LIFE During your first week back home, gradually add back a fiber supplement every day Experiment which form you can tolerate.   There are many forms such as powders, tablets, wafers, gummies, etc Psyllium bran (Metamucil), methylcellulose (Citrucel), Miralax  or Glycolax , Benefiber, Flax Seed.  Adjust the dose week-by-week (1/2 dose/day to 6 doses a day) until you are moving your bowels 1-2 times a day.  Cut back the dose or try a different fiber product if it is giving you problems such as diarrhea or bloating. Sometimes a  laxative is needed to help jump-start bowels if constipated until the fiber supplement can help regulate your bowels.  If you are tolerating eating & you are farting, it is okay to try a gentle laxative such as double dose MiraLax , prune juice, or Milk of Magnesia.  Avoid using laxatives too often. Stool softeners can sometimes help counteract the constipating effects of narcotic pain medicines.  It can also cause diarrhea, so avoid using for too long. If you are still constipated despite taking fiber daily, eating solids, and a few doses of laxatives, call our office. Controlling diarrhea Try drinking liquids and eating bland foods for a few days to avoid stressing your intestines further. Avoid dairy products (especially milk & ice cream) for a short time.  The intestines often can lose the ability to digest lactose when stressed. Avoid foods that cause gassiness or bloating.  Typical foods include beans and other legumes, cabbage, broccoli, and dairy foods.  Avoid greasy, spicy, fast foods.  Every person has some sensitivity to other foods, so listen to your body and avoid those foods that trigger problems for you. Probiotics (such as active yogurt, Align, etc) may help repopulate the intestines and colon with normal bacteria and calm down a sensitive digestive tract Adding a fiber supplement gradually can help thicken stools by absorbing excess fluid and retrain the intestines to act more normally.  Slowly increase the dose over a few weeks.  Too much fiber too soon can backfire and cause cramping & bloating. It is okay to try and slow down diarrhea with a few doses of antidiarrheal medicines.   Bismuth subsalicylate (ex. Kayopectate, Pepto Bismol) for a few doses can help control diarrhea.  Avoid if pregnant.   Loperamide  (Imodium ) can slow down diarrhea.  Start with one tablet (2mg ) first.  Avoid if you are having fevers or severe pain.  ILEOSTOMY PATIENTS WILL HAVE CHRONIC DIARRHEA since their  colon is not in use.    Drink plenty of liquids.  You will need to drink even more glasses of water /liquid a day to avoid getting dehydrated. Record output from your ileostomy.  Expect to empty the bag every 3-4 hours at first.  Most people with a permanent ileostomy empty their bag 4-6 times at the least.   Use antidiarrheal  medicine (especially Imodium ) several times a day to avoid getting dehydrated.  Start with a dose at bedtime & breakfast.  Adjust up or down as needed.  Increase antidiarrheal medications as directed to avoid emptying the bag more than 8 times a day (every 3 hours). Work with your wound ostomy nurse to learn care for your ostomy.  See ostomy care instructions. TROUBLESHOOTING IRREGULAR BOWELS 1) Start with a soft & bland diet. No spicy, greasy, or fried foods.  2) Avoid gluten/wheat or dairy products from diet to see if symptoms improve. 3) Miralax  17gm or flax seed mixed in 8oz. water  or juice-daily. May use 2-4 times a day as needed. 4) Gas-X, Phazyme, etc. as needed for gas & bloating.  5) Prilosec (omeprazole ) over-the-counter as needed 6)  Consider probiotics (Align, Activa, etc) to help calm the bowels down  Call your doctor if you are getting worse or not getting better.  Sometimes further testing (cultures, endoscopy, X-ray studies, CT scans, bloodwork, etc.) may be needed to help diagnose and treat the cause of the diarrhea. [facility] [address], [place], [place]  [address] [phone] - Main.    [phone]  - Toll Free.   [phone] - Fax [email or web address]

## 2024-03-12 NOTE — Plan of Care (Signed)
   Problem: Health Behavior/Discharge Planning: Goal: Ability to manage health-related needs will improve Outcome: Progressing

## 2024-03-12 NOTE — TOC Progression Note (Signed)
 Transition of Care [facility]) - Progression Note    Patient Details  Name: Calvin Escobar MRN: [id number] Date of Birth: 1951/03/24  [facility] CM/SW Contact  Inocente GORMAN Kindle, LCSW Phone Number: 03/12/2024, 11:08 AM  Clinical Narrative:    Updated [facility] that patient is having a procedure. He will require a new insurance approval if he is not able to discharge tomorrow. Pasrr received and placed on Fl2.    Expected Discharge Plan: Skilled Nursing Facility Barriers to Discharge: medical workup               Expected Discharge Plan and Services In-house Referral: Clinical Social Work   Post Acute Care Choice: Skilled Nursing Facility Living arrangements for the past 2 months: Single Family Home, Skilled Nursing Facility                                       Social Drivers of Health (SDOH) Interventions SDOH Screenings   Food Insecurity: Patient Unable To Answer (03/07/2024)  Housing: Unknown (03/07/2024)  Transportation Needs: Patient Unable To Answer (03/07/2024)  Utilities: Not At Risk (03/07/2024)  Alcohol Screen: Low Risk  (10/10/2023)  Depression (PHQ2-9): Low Risk  (12/09/2023)  Recent Concern: Depression (PHQ2-9) - Medium Risk (09/16/2023)  Financial Resource Strain: Low Risk  (10/10/2023)  Physical Activity: Sufficiently Active (10/10/2023)  Social Connections: Moderately Integrated (03/07/2024)  Recent Concern: Social Connections - Socially Isolated (12/11/2023)  Stress: No Stress Concern Present (10/10/2023)  Tobacco Use: Low Risk  (02/05/2024)  Health Literacy: Adequate Health Literacy (10/10/2023)    Readmission Risk Interventions    03/09/2024    3:43 PM 01/14/2024    2:09 PM 07/04/2023    1:27 PM  Readmission Risk Prevention Plan  Transportation Screening Complete  Complete  PCP or Specialist Appt within 5-7 Days   Complete  Home Care Screening   Complete  Medication Review (RN CM)   Complete  Medication Review (RN Care Manager) Complete  Complete   PCP or Specialist appointment within 3-5 days of discharge Complete Complete   HRI or Home Care Consult Complete Complete   SW Recovery Care/Counseling Consult Complete Complete   Palliative Care Screening Not Applicable Not Applicable   Skilled Nursing Facility Complete Complete

## 2024-03-12 NOTE — Anesthesia Procedure Notes (Signed)
 Procedure Name: Intubation Date/Time: 03/12/2024 3:47 PM  Performed by: Obadiah Reyes BROCKS, CRNAPre-anesthesia Checklist: Patient identified, Emergency Drugs available, Suction available and Patient being monitored Patient Re-evaluated:Patient Re-evaluated prior to induction Oxygen Delivery Method: Circle System Utilized Preoxygenation: Pre-oxygenation with 100% oxygen Induction Type: IV induction Ventilation: Mask ventilation without difficulty Laryngoscope Size: Miller and 2 Grade View: Grade I Tube type: Oral Number of attempts: 1 Airway Equipment and Method: Stylet and Oral airway Placement Confirmation: ETT inserted through vocal cords under direct vision, positive ETCO2 and breath sounds checked- equal and bilateral Secured at: 23 cm Tube secured with: Tape Dental Injury: Teeth and Oropharynx as per pre-operative assessment

## 2024-03-12 NOTE — Plan of Care (Signed)
  Problem: Education: Goal: Knowledge of General Education information will improve Description: Including pain rating scale, medication(s)/side effects and non-pharmacologic comfort measures Outcome: Progressing   Problem: Health Behavior/Discharge Planning: Goal: Ability to manage health-related needs will improve Outcome: Progressing   Problem: Clinical Measurements: Goal: Ability to maintain clinical measurements within normal limits will improve Outcome: Progressing Goal: Will remain free from infection Outcome: Progressing Goal: Diagnostic test results will improve Outcome: Progressing Goal: Respiratory complications will improve Outcome: Progressing Goal: Cardiovascular complication will be avoided Outcome: Progressing   Problem: Activity: Goal: Risk for activity intolerance will decrease Outcome: Progressing   Problem: Nutrition: Goal: Adequate nutrition will be maintained Outcome: Progressing   Problem: Coping: Goal: Level of anxiety will decrease Outcome: Progressing   Problem: Elimination: Goal: Will not experience complications related to bowel motility Outcome: Progressing Goal: Will not experience complications related to urinary retention Outcome: Progressing   Problem: Pain Managment: Goal: General experience of comfort will improve and/or be controlled Outcome: Progressing   Problem: Safety: Goal: Ability to remain free from injury will improve Outcome: Progressing   Problem: Skin Integrity: Goal: Risk for impaired skin integrity will decrease Outcome: Progressing   Problem: Safety: Goal: Non-violent Restraint(s) Outcome: Progressing   Problem: Education: Goal: Understanding of discharge needs will improve Outcome: Progressing Goal: Verbalization of understanding of the causes of altered bowel function will improve Outcome: Progressing   Problem: Activity: Goal: Ability to tolerate increased activity will improve Outcome: Progressing    Problem: Bowel/Gastric: Goal: Gastrointestinal status for postoperative course will improve Outcome: Progressing   Problem: Health Behavior/Discharge Planning: Goal: Identification of community resources to assist with postoperative recovery needs will improve Outcome: Progressing   Problem: Nutritional: Goal: Will attain and maintain optimal nutritional status will improve Outcome: Progressing   Problem: Clinical Measurements: Goal: Postoperative complications will be avoided or minimized Outcome: Progressing   Problem: Respiratory: Goal: Respiratory status will improve Outcome: Progressing   Problem: Skin Integrity: Goal: Will show signs of wound healing Outcome: Progressing

## 2024-03-12 NOTE — Progress Notes (Signed)
 Occupational Therapy Treatment Patient Details Name: Calvin Escobar MRN: [id number] DOB: May 02, 1951 Today's Date: 03/12/2024   History of present illness 73 y.o. male presents to [facility] from SNF with AMS and hypotension. Admitted with AKI w/ hyperkalemia, UTI, and acute encephalopathy.  PMHx: CKD stage 3a, hx of VRE, HTN, HLD, OSA on cpap, ACD, dementia, bipolar, anxiety and depression, chronic indwelling foley catheter, non ischemic CM with HFrEF, colovesical fistula/sigmoid diveritculitis, dementia   OT comments  Pt presented in bed and reported they were tired but did not communicate much more during the session. He was able to complete supine to sitting with max assist and sit to stand with max assist to RW. Pt then on second stand then attempted to take some lateral steps to the [facility] and then max assist for sitting to supine and total x2 to reposition in bed. Patient will benefit from continued inpatient follow up therapy, <3 hours/day.       If plan is discharge home, recommend the following:  Two people to help with walking and/or transfers;A lot of help with bathing/dressing/bathroom;Assistance with cooking/housework;Direct supervision/assist for medications management;Direct supervision/assist for financial management;Assist for transportation   Equipment Recommendations   (TBD at next venue)    Recommendations for Other Services      Precautions / Restrictions Precautions Precautions: Fall Recall of Precautions/Restrictions: Impaired Precaution/Restrictions Comments: Pt in mitts, restrains and Oncologist Restrictions Weight Bearing Restrictions Per Provider Order: No       Mobility Bed Mobility Overal bed mobility: Needs Assistance Bed Mobility: Supine to Sit Rolling: Max assist   Supine to sit: Max assist, HOB elevated, Used rails     General bed mobility comments: needs step by step cues with increase in time    Transfers Overall transfer level: Needs  assistance Equipment used: Rolling walker (2 wheels) Transfers: Sit to/from Stand Sit to Stand: Max assist, From elevated surface     Step pivot transfers: Mod assist     General transfer comment: only completed side stepping to [facility]     Balance Overall balance assessment: Needs assistance Sitting-balance support: Feet supported Sitting balance-Leahy Scale: Fair Sitting balance - Comments: often leaning forward and placing hands in head     Standing balance-Leahy Scale: Poor Standing balance comment: reliant on walker                           ADL either performed or assessed with clinical judgement   ADL Overall ADL's : Needs assistance/impaired Eating/Feeding: Maximal assistance   Grooming: Maximal assistance;Sitting   Upper Body Bathing: Maximal assistance;Sitting   Lower Body Bathing: Total assistance;Sitting/lateral leans   Upper Body Dressing : Maximal assistance   Lower Body Dressing: Total assistance       Toileting- Clothing Manipulation and Hygiene: Total assistance              Extremity/Trunk Assessment Upper Extremity Assessment Upper Extremity Assessment: Generalized weakness;Difficult to assess due to impaired cognition   Lower Extremity Assessment Lower Extremity Assessment: Defer to PT evaluation        Vision   Additional Comments: not fomally assess but kept a gze to R side and occasionally looked to the L   Perception     Praxis     Communication Communication Communication: No apparent difficulties   Cognition Arousal: Alert Behavior During Therapy: Flat affect Cognition: History of cognitive impairments, Difficult to assess  OT - Cognition Comments: hx dementia                 Following commands: Impaired Following commands impaired: Follows one step commands inconsistently      Cueing   Cueing Techniques: Verbal cues, Tactile cues  Exercises      Shoulder Instructions        General Comments      Pertinent Vitals/ Pain       Pain Assessment Pain Assessment: No/denies pain Pain Score: 0-No pain  Home Living                                          Prior Functioning/Environment              Frequency  Min 2X/week        Progress Toward Goals  OT Goals(current goals can now be found in the care plan section)  Progress towards OT goals: Progressing toward goals  Acute Rehab OT Goals Patient Stated Goal: none OT Goal Formulation: With patient Time For Goal Achievement: 03/21/24 Potential to Achieve Goals: Good ADL Goals Pt Will Transfer to Toilet: ambulating;regular height toilet;with min assist Pt/caregiver will Perform Home Exercise Program: Both right and left upper extremity;With Supervision Additional ADL Goal #1: pt will perform bed mobility supervision in prep for ADLs Additional ADL Goal #2: pt will tolerate OOB standing activity x5 min in order to improve activity tolerance for ADLs  Plan      Co-evaluation                 AM-PAC OT 6 Clicks Daily Activity     Outcome Measure   Help from another person eating meals?: A Little Help from another person taking care of personal grooming?: A Lot Help from another person toileting, which includes using toliet, bedpan, or urinal?: Total Help from another person bathing (including washing, rinsing, drying)?: A Lot Help from another person to put on and taking off regular upper body clothing?: A Lot Help from another person to put on and taking off regular lower body clothing?: Total 6 Click Score: 11    End of Session Equipment Utilized During Treatment: Gait belt;Rolling walker (2 wheels)  OT Visit Diagnosis: Unsteadiness on feet (R26.81);Other abnormalities of gait and mobility (R26.89);Muscle weakness (generalized) (M62.81)   Activity Tolerance Patient limited by fatigue   Patient Left in bed;with call bell/phone within reach;with bed alarm set  (restraint reapplied)   Nurse Communication Mobility status        Time: 8864-8788 OT Time Calculation (min): 36 min  Charges: OT General Charges $OT Visit: 1 Visit OT Treatments $Self Care/Home Management : 23-37 mins  Warrick POUR OTR/L  Acute Rehab Services  [phone] office number   Warrick Berber 03/12/2024, 12:21 PM

## 2024-03-12 NOTE — Progress Notes (Addendum)
 Initial Nutrition Assessment  DOCUMENTATION CODES:   Severe malnutrition in context of chronic illness  INTERVENTION:  Upon resumption of oral diet:  Ensure Plus High Protein po TID, each supplement provides 350 kcal and 20 grams of protein Multivitamin with minerals daily Magic cup TID with meals, each supplement provides 290 kcal and 9 grams of protein    Monitor diet advancement and tolerance  Monitor bowel frequency/consistency   Automatic meal trays + feeding assistance  NUTRITION DIAGNOSIS:  Severe Malnutrition related to chronic illness as evidenced by severe fat depletion, severe muscle depletion.  GOAL:  Patient will meet greater than or equal to 90% of their needs  MONITOR:  PO intake, Supplement acceptance, Labs, Other (Comment) (diet advancement/tolerance/bowels)  REASON FOR ASSESSMENT:  Consult Calorie Count  ASSESSMENT:  Pt with PMH significant for: CKD III, systolic HF, VRE, HTN, HLD, OSA on cpap, dementia, bipolar, anxiety and depression. Multiple admissions since June. Recent hospitalizations revolving around colovesical fistula/sigmoid diverticulitis s/p robotic rectosigmoid resection w/ anastamosis, colovesical fistula takedown and replair, diverting ileostomy, drainage of pelvic abscess and lysis of adhesions. Discharged to SNF on 8/12. Pt has struggled with high ileostomy output, which has precipitated AKI on top of CKD III dx. Has chronic indwelling Foley catheter.  Previous Admissions 6/30 - 7/5: candidemia 7/09 - 7/17: sepsis r/t UTI/bacteremia 8/07 - 8/12: colovesical fistula 9/03 - 9/07: AKI on chronic CKD due to high ostomy output Current Admission 10/3: admitted 10/4: downgraded to DYS2/thins due to AMS 10/9: loop ileostomy takedown  Consult for calorie count received yesterday. Patient scheduled for loop ileostomy takedown today and is NPO negating calorie count documented intake for today. Will re-assess when oral diet advanced.   Average  Meal Intake 10/4: 20% x1 documented meal 10/5: 65% x1 documented meal 10/8: 0-40% x2 documented meals  He is a poor historian with dementia diagnosis. Very lethargic this morning and difficult to obtain nutrition hx. No family at bedside. He is in restraints d/t continuing to remove ostomy bag. Reviewed RD notes from patient admission one month ago at [facility] to assess. He previously reported intake was okay, prior to surgery on 8/7 of this year, however reported on follow up admission (early September) that appetite had been poor and he was not consuming Ensure supplements at the facility. Also reported as a picky eater.   Per nurse tech, patient does well with pudding, Magic Cup, and juices. Seems to prefer drinks over whole foods. Will order Ensure and Magic Cup. Will also order automatic meal trays.   Admit Weight: 98.2 kg Current Weight: 90.2 kg  Pt unable to report UBW. Per chart review, he has shown 10% weight loss in one month and 16.4% in the last three months. These trends are considered clinically significant for the time frames reviewed. No edema on exam. Bowels moving. Ostomy output documented seems low, given his notable hx with documented high output. Question accuracy. While Ensure was likely not helping with ostomy output, he was likely not consuming. Now appropriate for this ONS as ostomy has been taken down.   Drains/Lines: RLQ: ileostomy loop (placed 8/7) Foley catheter UOP: 1250 ml x24 hours  Vitamin labs drawn during last admission to assess for deficiency d/t high ostomy output as well as malnutrition dx. CRP was not drawn during that time and, therefore, cannot accurately interpret those labs. Copper , vitamin D , selenium and zinc  all within desirable range at that time. Will elect not to redraw now that ostomy has been reversed.  Will monitor for other s/sx of deficiency.   Meds: ferrous sulfate , folic acid , melatonin, pantoprazole , polycarbophil, IV ABX    Labs:  Na+ 140 (wdl) K+ 4.4 (wdl) Crt 2.61>2.40>2.16 (H) BUN 31>25>18 (wdl) CRP 11.4 (H) CBGs 107-113 x24 hours A1c 5.0 (12/2023)  NUTRITION - FOCUSED PHYSICAL EXAM:  Flowsheet Row Most Recent Value  Orbital Region Moderate depletion  Upper Arm Region Severe depletion  Thoracic and Lumbar Region Moderate depletion  Buccal Region Severe depletion  Temple Region Moderate depletion  Clavicle Bone Region Severe depletion  Clavicle and Acromion Bone Region Severe depletion  Scapular Bone Region Moderate depletion  Dorsal Hand Unable to assess  [mittens]  Patellar Region Severe depletion  Anterior Thigh Region Severe depletion  Posterior Calf Region Severe depletion  Edema (RD Assessment) None  Hair Reviewed  Eyes Reviewed  Mouth Reviewed  [lips are dry and cracked]  Skin Reviewed  Nails Unable to assess  [mittens]     Diet Order:   Diet Order             Diet NPO time specified Except for: [employer], Sips with Meds  Diet effective ____             EDUCATION NEEDS:   No education needs have been identified at this time  Skin:  Skin Assessment: Skin Integrity Issues: Skin Integrity Issues:: Incisions Incisions: lower right abdomen  Last BM:  10/8 - 150 ml x24 hours via loop ileostomy (appears innacurate with only 12 hours documented; also questionable as he is with recent hx of high ostomy output)  Height:  Ht Readings from Last 1 Encounters:  03/06/24 6' (1.829 m)   Weight:  Wt Readings from Last 1 Encounters:  03/12/24 90.2 kg    Ideal Body Weight:  80.9 kg  BMI:  Body mass index is 26.97 kg/m.  Estimated Nutritional Needs:   Kcal:  1900-2100 kcals  Protein:  95-110g  Fluid:  1.9-2.0L/day + ostomy output  Blair Deaner MS, RD, LDN Registered Dietitian Clinical Nutrition RD Inpatient Contact Info in Amion

## 2024-03-12 NOTE — Anesthesia Preprocedure Evaluation (Addendum)
 Anesthesia Evaluation  Patient identified by MRN, date of birth, ID band Patient awake    Reviewed: Allergy & Precautions, NPO status , Patient's Chart, lab work & pertinent test results  History of Anesthesia Complications Negative for: history of anesthetic complications  Airway Mallampati: III  TM Distance: >3 FB Neck ROM: Full    Dental  (+) Dental Advisory Given   Pulmonary sleep apnea    Pulmonary exam normal        Cardiovascular hypertension, Pt. on medications and Pt. on home beta blockers +CHF  Normal cardiovascular exam+ Valvular Problems/Murmurs MR    '25 TTE - EF 25 to 30%. Global hypokinesis. There is mild left ventricular hypertrophy. There is mildly elevated pulmonary artery systolic pressure. The estimated right ventricular systolic pressure is 43.9 mmHg. Mild to moderate mitral valve regurgitation. Aortic dilatation noted. There is dilatation of the ascending aorta, measuring 41 mm.      Neuro/Psych  PSYCHIATRIC DISORDERS Anxiety Depression Bipolar Disorder  Dementia negative neurological ROS     GI/Hepatic Neg liver ROS, PUD,GERD  Medicated and Controlled,,  Endo/Other  negative endocrine ROS    Renal/GU CRFRenal disease Bladder dysfunction      Musculoskeletal  (+) Arthritis ,    Abdominal   Peds  Hematology  (+) Blood dyscrasia, anemia   Anesthesia Other Findings   Reproductive/Obstetrics                              Anesthesia Physical Anesthesia Plan  ASA: 4  Anesthesia Plan: General   Post-op Pain Management: Ofirmev  IV (intra-op)*   Induction: Intravenous  PONV Risk Score and Plan: 2 and Treatment may vary due to age or medical condition, Ondansetron  and Propofol  infusion  Airway Management Planned: Oral ETT  Additional Equipment: ClearSight  Intra-op Plan:   Post-operative Plan: Extubation in OR  Informed Consent: I have reviewed the patients  History and Physical, chart, labs and discussed the procedure including the risks, benefits and alternatives for the proposed anesthesia with the patient or authorized representative who has indicated his/her understanding and acceptance.     Dental advisory given  Plan Discussed with: CRNA and Anesthesiologist  Anesthesia Plan Comments:          Anesthesia Quick Evaluation

## 2024-03-12 NOTE — Op Note (Signed)
 03/12/2024  5:37 PM  PATIENT:  Calvin Escobar  73 y.o. male  Patient Care Team: [name], MD as PCP - General (Internal Medicine) [name], MD as PCP - Cardiology (Cardiology) [name], MD (Dermatology) [facility] (Behavioral Health) [facility] (Psychiatry) [name], [name] LABOR, MD (Psychiatry) Legrand Victory LITTIE MOULD, MD as Consulting Physician (Gastroenterology) Sheldon Standing, MD as Consulting Physician (Colon and Rectal Surgery) [name], MD as Consulting Physician (Urology) Daneen Damien BROCKS, NP as Nurse Practitioner (Cardiology)  PRE-OPERATIVE DIAGNOSIS:  LOOP ILEOSTOMY IN PLACE FOR FECAL DIVERSION  POST-OPERATIVE DIAGNOSIS:  LOOP ILEOSTOMY IN PLACE FOR FECAL DIVERSION  PROCEDURE:  CLOSURE, ILEOSTOMY  SURGEON:  [name], MD  ASSISTANT:  (n/a)   ANESTHESIA:  General endotracheal intubation anesthesia (GETA) and Local & regional field block at incision(s) for perioperative & postoperative pain control provided with 30mL of bupivicaine 0.5% with epinephrine   Estimated Blood Loss (EBL):   Total I/O In: -  Out: 625 [Urine:400; Stool:150; Blood:75].   (See anesthesia record)  Delay start of Pharmacological VTE agent (>24hrs) due to concerns of significant anemia, surgical blood loss, or risk of bleeding?:  no  DRAINS: (None)  SPECIMEN:  Ileostomy  DISPOSITION OF SPECIMEN:  Pathology  COUNTS:  Sponge, needle, & instrument counts CORRECT at the conclusion of the case.      PLAN OF CARE: Admit to inpatient   PATIENT DISPOSITION:  PACU - hemodynamically stable.  INDICATION:  Patient requiring diverting loop ileostomy.  The patient has recovered to the point not requiring fecal diversion.  Requested ostomy takedown.  I felt was appropriate this time. I recommended takedown of ileostomy and examination under anesthesia:   The anatomy & physiology of the digestive tract was discussed. The pathophysiology was  discussed. Possibility of remaining with an ostomy permanently was discussed. I offered ostomy takedown. Laparoscopic & open techniques were discussed.   Risks such as bleeding, infection, abscess, leak, reoperation, possible re-ostomy, injury to other organs, hernia, heart attack, death, and other risks were discussed. I noted a good likelihood this will help address the problem. Goals of post-operative recovery were discussed as well. We will work to minimize complications. Questions were answered. The patient expresses understanding & wishes to proceed with surgery.   OR FINDINGS:  Dense adhesions of loop ileostomy to abdominal wall.  No parastomal hernia.  It is a side-to-side stapled ileoileal anastomosis close to the terminal ileum.   CASE DATA: Type of patient?: Inpatient [facility] Inpatient) Status of Case? URGENT Add On Infection Present At Time Of Surgery (PATOS)?  NO  DESCRIPTION:   Informed consent was confirmed. The patient received IV antibiotics and underwent general anesthesia without any difficulty. The patient was positioned in low litholtomy. Foley catheter was sterilely placed. SCDs were active during the entire case. The abdomen was cprepped and draped in a sterile fashion. A surgical timeout confirmed our plan.   I proceeded to make a circular incision around the loop ileostomy. The loop ileostomy was freed from adhesions to the subcutaneous and fascial layers of the abdominal wall using focused cautery and sharp dissection.  I was able to breech safely into the peritoneum. No adhesions felt. We could eviscerate the small bowel just proximal/distal to the loop ileostomy.   We did a side-to-side stapled anastomosis using a GIA-75 stapler.  We then used a TLX 90 to staple off the common defect.   I used interrupted 2-0 silk stitches to close  the mesenteric defect transversely and do mesenteo-pexy over the TX staple line.  Another silk suture placed at the crotch of proximal end of the  anastomosis for antitension.  Hemostasis was excellent. Ileostomy returned into the peritoneal cavity.  We changed gloves.  We assured hemostasis and the former ostomy wound.  Wound irrigated.  Anterior rectus fascia was closed using #1 PDS in a running fashion transversely. Skin edges trimmed to remove excess scarring and have healthy edges.    I excised redundant tissue to have a transverse biconcave wound.  I closed this with interrupted suture Vicryl at [employer] and deep dermal 4-0 monocryl sutures.  Placed umbilical tape wicks x2 in the wound to allow deeper drainage..  Sterile dressing placed.   Patient extubated and in recovery room in stable condition.  I discussed postoperative plans for recovery with the patient in the holding area.  Instructions are written.  I discussed operative findings, updated the patient's status, discussed probable steps to recovery, and gave postoperative recommendations to the patient's spouse, Investment banker, corporate.  Recommendations were made.  Questions were answered.  She expressed understanding & appreciation.  Elspeth KYM Schultze, MD, FACS, MASCRS Esophageal, Gastrointestinal & Colorectal Surgery Robotic and Minimally Invasive Surgery  [facility], [facility]  [facility]  [address], [address] [place], [place] [address] [phone] Fax [phone] Main      03/12/2024

## 2024-03-12 NOTE — Progress Notes (Addendum)
 His bed on [address] 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 Lyndell was [id number] centimeter abscess found IntraOp Readmission 9/3 through 9/7 large amount of output from ileostomy-had AKI with creatinine 3.1 (baseline 1.3) found to have gram negative bacteremia from UTI/Klebsiella and was discharged to skilled nursing facility  10/3 readmit metabolic encephalopathy from skilled facility in the setting of uremia hyperkalemia and borderline anemia for him  Labs on admit Sodium 131 potassium 5.7 BUN/creatinine 71/5.4 (21/1.4) LFTs normal procalcitonin 3.3 WBC 6.8 hemoglobin 7.7 platelet 192 UA turbid moderate leukocytes moderate hemoglobin Urine culture eventually showed Klebsiella again  Imaging CXR very low bilateral lung volumes with bibasilar atelectasis  Nephrology consulted-fractional excretion sodium 2% indeterminant renal ultrasound hydronephrosis with Boop Urology consulted--CT cystogram 10/8 = dramatic right hydro improvement with right ureteric reflux causing right hydro and Boop is present and bladder not draining recommend continued bladder drainage There is no colovesical fistula and no indication to stent R ureter-urology team will follow in the outpatient setting to set up follow-up as an outpatient    Assessment  & Plan :    Acute kidney  injury Likely secondary to large volume losses with high output ostomy Reversal of ostomy planned today 10/9 to mitigate further effect of high output ostomy despite best efforts Sinus tachycardia overnight along with hypertension This seems to be intermittent he has a Foley in place he is not confused When I ask him he is not in severe pain He is maintained on heparin  so I doubt that this is any other diagnoses such as VTE Sepsis secondary to Klebsiella UTI-urine culture confirms >100,000 K pneumonia with intermediate resistance -- continues currently Ancef 2 g Q12 monotherapy As will be going to surgery would continue IV antibiotics through surgery Acute encephalopathy secondary to uremia and multiple beers list criteria medications--holding Norco, Abilify  2, Remeron  30, trazodone  100, Effexor  75 and 150 He has been placed in nonviolent restraints with safety observation because he takes off his ostomy and things around his stool He will need to be fed Try to avoid Haldol  2 mg every 6 as needed IM but order if severe Continuing Seroquel 25 daily and 50 at bedtime for agitation as he was pulling at his ostomy Foley etc. Continue BuSpar  10 3 times daily Chronic HFrEF echo 12/2023 EF 25-30% Chronic indwelling Foley with history of BPH and urethral stricture status postdilatation 11/30/2023 Urology saw the patient and did CT cystogram which showed above They are recommending outpatient management options and have signed off as of 10/8 Recent anterior low resection for colovesical fistula- Dr. Sheldon guiding therapy with fiber twice daily and iron  twice daily antidiarrheals as needed-needs to have less than 1000 cc out-CT with rectal contrast only Probably going for surgery later today who  Wife updated postop by telephone after I saw him in PACU and he looked pretty stable  Data Reviewed:   Sodium 140 potassium 4.4 BUN/creatinine 18/2.9 magnesium  1.8 WBC 9.3 hemoglobin 9.4 platelet  228  DVT prophylaxis: SCD  Status is: Inpatient Remains inpatient appropriate because: Needs surgery     Current Dispo: Inpatient     Subjective:    He is much more coherent today he is oriented x 4 he knows he is going for surgery He remains in mittens and restraints He tells me he is a little anxious about the surgery he has no pain no fever His Foley is draining clear fluid he probably  Objective + exam Vitals:   03/12/24 0232 03/12/24 0400 03/12/24 0500 03/12/24 0853  BP:  (!) 149/99  129/83  Pulse:  (!) 128    Resp:  16    Temp: 99.3 F (37.4 C)   97.6 F (36.4 C)  TempSrc: Axillary   Oral  SpO2:  98%    Weight:   90.2 kg   Height:       Filed Weights   03/10/24 0412 03/11/24 0500 03/12/24 0500  Weight: 93.5 kg 90.6 kg 90.2 kg     Examination:   EOMI NCAT no focal deficit no icterus no pallor looks c chronically ill-appearing Chest clear  Telemetry personally reviewed S1 S2 no murmur Abdomen is soft no rebound with ostomy in place-has indwelling Foley catheter in place ROM is intact-but he is in mittens     Scheduled Meds:  acetaminophen   1,000 mg Oral On Call to OR   alvimopan   12 mg Oral On Call to OR   busPIRone   10 mg Oral TID   carvedilol   3.125 mg Oral BID WC   Chlorhexidine  Gluconate Cloth  6 each Topical Daily   Chlorhexidine  Gluconate Cloth  6 each Topical Once   ferrous sulfate   325 mg Oral BID WC   finasteride   5 mg Oral Daily   folic acid   1 mg Oral Daily   gabapentin   200 mg Oral On Call to OR   heparin   5,000 Units Subcutaneous Q8H   melatonin  5 mg Oral QHS   pantoprazole   40 mg Oral Daily   polycarbophil  625 mg Oral BID   QUEtiapine  25 mg Oral Q0600   QUEtiapine  50 mg Oral QHS   tamsulosin   0.4 mg Oral Daily   Continuous Infusions:   ceFAZolin (ANCEF) IV 2 g (03/12/24 0558)   cefoTEtan  (CEFOTAN ) IV      Time 60  Colen Grimes, MD  [facility]

## 2024-03-13 ENCOUNTER — Encounter (HOSPITAL_COMMUNITY): Payer: Self-pay | Admitting: Surgery

## 2024-03-13 DIAGNOSIS — N1831 Chronic kidney disease, stage 3a: Secondary | ICD-10-CM | POA: Diagnosis not present

## 2024-03-13 DIAGNOSIS — N179 Acute kidney failure, unspecified: Secondary | ICD-10-CM | POA: Diagnosis not present

## 2024-03-13 DIAGNOSIS — E43 Unspecified severe protein-calorie malnutrition: Secondary | ICD-10-CM | POA: Insufficient documentation

## 2024-03-13 LAB — BASIC METABOLIC PANEL WITH GFR
Anion gap: 9 (ref 5–15)
BUN: 17 mg/dL (ref 8–23)
CO2: 21 mmol/L — ABNORMAL LOW (ref 22–32)
Calcium: 8 mg/dL — ABNORMAL LOW (ref 8.9–10.3)
Chloride: 106 mmol/L (ref 98–111)
Creatinine, Ser: 2.01 mg/dL — ABNORMAL HIGH (ref 0.61–1.24)
GFR, Estimated: 34 mL/min — ABNORMAL LOW (ref >60)
Glucose, Bld: 123 mg/dL — ABNORMAL HIGH (ref 70–99)
Potassium: 4.2 mmol/L (ref 3.5–5.1)
Sodium: 136 mmol/L (ref 135–145)

## 2024-03-13 LAB — CBC WITH DIFFERENTIAL/PLATELET
Abs Immature Granulocytes: 0.1 K/uL — ABNORMAL HIGH (ref 0.00–0.07)
Basophils Absolute: 0.2 K/uL — ABNORMAL HIGH (ref 0.0–0.1)
Basophils Relative: 2 %
Eosinophils Absolute: 0.1 K/uL (ref 0.0–0.5)
Eosinophils Relative: 1 %
HCT: 28.8 % — ABNORMAL LOW (ref 39.0–52.0)
Hemoglobin: 8.7 g/dL — ABNORMAL LOW (ref 13.0–17.0)
Immature Granulocytes: 1 %
Lymphocytes Relative: 17 %
Lymphs Abs: 2 K/uL (ref 0.7–4.0)
MCH: 26.7 pg (ref 26.0–34.0)
MCHC: 30.2 g/dL (ref 30.0–36.0)
MCV: 88.3 fL (ref 80.0–100.0)
Monocytes Absolute: 0.5 K/uL (ref 0.1–1.0)
Monocytes Relative: 4 %
Neutro Abs: 9.2 K/uL — ABNORMAL HIGH (ref 1.7–7.7)
Neutrophils Relative %: 75 %
Platelets: 185 K/uL (ref 150–400)
RBC: 3.26 MIL/uL — ABNORMAL LOW (ref 4.22–5.81)
RDW: 15.3 % (ref 11.5–15.5)
WBC: 12.2 K/uL — ABNORMAL HIGH (ref 4.0–10.5)
nRBC: 0 % (ref 0.0–0.2)

## 2024-03-13 LAB — MAGNESIUM: Magnesium: 1.6 mg/dL — ABNORMAL LOW (ref 1.7–2.4)

## 2024-03-13 LAB — PHOSPHORUS: Phosphorus: 4.3 mg/dL (ref 2.5–4.6)

## 2024-03-13 MED ORDER — ENSURE SURGERY PO LIQD: 237.0000 mL | Freq: Two times a day (BID) | ORAL | Status: DC

## 2024-03-13 MED ORDER — MAGNESIUM SULFATE 2 GM/50ML IV SOLN
2.0000 g | Freq: Once | INTRAVENOUS | Status: AC
  Administered 2024-03-13: 2 g via INTRAVENOUS
  Filled 2024-03-13: qty 50

## 2024-03-13 MED ORDER — BISACODYL 10 MG RE SUPP
10.0000 mg | Freq: Every day | RECTAL | Status: DC
  Administered 2024-03-13 – 2024-03-14 (×2): 10 mg via RECTAL
  Filled 2024-03-13 (×2): qty 1

## 2024-03-13 MED ORDER — SODIUM CHLORIDE 0.9 % IV SOLN
200.0000 mg | Freq: Once | INTRAVENOUS | Status: AC
  Administered 2024-03-13: 200 mg via INTRAVENOUS
  Filled 2024-03-13: qty 10

## 2024-03-13 NOTE — Progress Notes (Signed)
 03/13/2024  Calvin Escobar [id number] [birth year]  CARE TEAM: PCP: Calvin [name], MD  Outpatient Care Team: Patient Care Team: Calvin [name], MD as PCP - General (Internal Medicine) Calvin Wilbert SAUNDERS, MD as PCP - Cardiology (Cardiology) Calvin Norleen, MD (Dermatology) [facility] (Behavioral Health) [facility] (Psychiatry) [name], [name] LABOR, MD (Psychiatry) Legrand, Victory LITTIE MOULD, MD as Consulting Physician (Gastroenterology) [facility]obar Standing, MD as Consulting Physician (Colon and Rectal Surgery) Calvin Sherwood JONETTA MOULD, MD as Consulting Physician (Urology) Calvin Damien BROCKS, NP as Nurse Practitioner (Cardiology)  Inpatient Treatment Team: Treatment Team:  Samtani, Jai-Gurmukh, MD Shane Steffan BROCKS, MD [facility]obar Standing, MD Calvin Rosaline SAUNDERS, RN Irvin, Dailyn A, NT Bobbette Hurdle, MD Calvin Hunter DEL, RN   Problem List:   Principal Problem:   Acute kidney injury superimposed on stage 3a chronic kidney disease Calvin Escobar) with hyperkalemia Active Problems:   Dementia (HCC)   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)   Bipolar affective disorder, depressed (HCC)   Chronic HFrEF (heart failure with reduced ejection fraction) (HCC)   HLD (hyperlipidemia)   Hyperkalemia   Morbid obesity (HCC)   Hypertension   Chronic kidney disease, stage 3a (HCC)   Anemia of chronic disease   Recurrent UTI   GERD (gastroesophageal reflux disease)   Hydronephrosis, right   History of urinary retention   Chronic indwelling Foley catheter   Malnutrition of moderate degree   OSA on CPAP   Diverticulitis of sigmoid colon s/p robotic colectomy 01/09/2024   Acute encephalopathy   Chronic hyponatremia   Protein-calorie malnutrition, severe   03/12/2024      POST-OPERATIVE DIAGNOSIS:  LOOP ILEOSTOMY IN PLACE FOR FECAL DIVERSION   PROCEDURE:  CLOSURE, ILEOSTOMY   SURGEON:  Calvin KYM Sheldon, MD  OR FINDINGS:  Dense adhesions of loop ileostomy to abdominal wall.  No parastomal  hernia.  It is a side-to-side stapled ileoileal anastomosis close to the terminal ileum.    01/09/2024  POST-OPERATIVE DIAGNOSIS:   COLOVESICAL FISTULA WITH ABSCESS RECTOSIGMOID STRICTURE   PROCEDURE:   -ROBOTIC LOW ANTERIOR RECTOSIGMOID RESECTION (LAR) WITH ANASTOMOSIS -COLOVESICAL FISTULA TAKEDOWN & REPAIR -DIVERTING LOOP ILEOSTOMY (DLI),  =DRAINAGE OF PELVIC ABSCESS -LYSIS OF ADHESIONS x45 MINUTES (30% OF CASE) -INTRAOPERATIVE ASSESSMENT OF TISSUE VASCULAR PERFUSION USING ICG (indocyanine green ) IMMUNOFLUORESCENCE -TRANSVERSUS ABDOMINIS PLANE (TAP) BLOCK - BILATERAL -FLEXIBLE SIGMOIDOSCOPY   SURGEON:  Calvin KYM Sheldon, MD  OR FINDINGS:   Patient had very thickened rectosigmoid colon folded and twisted upon itself with stricturing.  Extremely dense adhesions to the left dome of the bladder with 4 x 3 x 3 cm abscess.  Abscess decompressed.  No active persistent opening of the bladder on insufflation.   No obvious metastatic disease on visceral parietal peritoneum or liver.   It is a 31mm EEA anastomosis ( distal descending colon  connected to proximal rectum.)  It rests 17 cm from the anal verge by flexible sigmoidoscopy    Assessment Calvin Escobar Stay = 7 days) 1 Day Post-Op    Guarded but stable   Plan:  ERAS protocol.  Dysphagia 1 diet.  Vance to dysphagia 3 as tolerated.  Keep speech therapy involved for cognitive and swallowing issues to be safe.  Most likely some encephalopathy secondary to his uremia and history of heavy alcohol intake in the past.  Defer to medicine help appreciated.  He is no longer any wrist restraints.  Perhaps that is because the ostomy is gone now.  Remove dressings and wicks on postop day  2 = 10/11 Saturday.  Follow and correct electrolytes.  Keep potassium greater than 4, magnesium  greater than 2, phosphorus greater than 3.  Creatinine relatively stable and nonoliguric.  Tried to be able to switch out Foley catheter while he is asleep but  due to equipment issues that was not feasible.  He wanted to have urology available and he was called away with another case at the time as well.  Defer to them on switching out when the equipment is available inpatient or most likely with close outpatient follow-up.  Needs to keep Foley catheter given significant prostatic outflow issues.  Slowly improving.  No evidence of any more proximal obstruction or concerns.  Defer to treatment of UTI to urology and internal medicine.  No evidence of any recurrent colovesical fistula nor any anastomotic leak is reassuring.  Try to keep on the dry side with his chronic HFrEF 30%  mobilize as tolerated to help recovery.  Enlist therapies in moderate/high risk patients as appropriate.  Physical therapy and Occupational Therapy ordered.    I updated the patient's status to the patient patient's wife was at bedside so was able to update and give a long discussion about surgical interventions, reasoning, postoperative recommendations and plans.  Recommendations were made.  Questions were answered.  He expressed some understanding & appreciation.  -Disposition: TBD    I reviewed nursing notes, hospitalist notes, last 24 h vitals and pain scores, last 48 h intake and output, last 24 h labs and trends, and last 24 h imaging results.  I have reviewed this patient's available data, including medical history, events of note, test results, etc as part of my evaluation.   A significant portion of that time was spent in counseling. Care during the described time interval was provided by me.  This care required moderate level of medical decision making.  03/13/2024    Subjective: (Chief complaint)  No major events last night.  Patient less agitated not needing wrist restraints.  Denies any pain.  Not particularly interactive.  Objective:  Vital signs:  Vitals:   03/13/24 0124 03/13/24 0131 03/13/24 0500 03/13/24 0520  BP: 117/77   133/78  Pulse: (!)  101   (!) 112  Resp: 20 20  16   Temp: 98.3 F (36.8 C)     TempSrc: Oral     SpO2: 95%   96%  Weight:   91.3 kg   Height:        Last BM Date : 03/12/24  Intake/Output   Yesterday:  10/09 0701 - 10/10 0700 In: 1100 [P.O.:600; I.V.:500] Out: 1225 [Urine:1000; Stool:150; Blood:75] This shift:  Total I/O In: 600 [P.O.:600] Out: 350 [Urine:350]  Bowel function:  Flatus: YES  BM:  No -   Drain: (No drain)   Physical Exam:  General: Pt awake.  Somewhat masked face.  Less interactive.  Not agitated no acute distress Eyes: PERRL, normal EOM.  Sclera clear.  No icterus Neuro: CN II-XII intact w/o focal sensory/motor deficits. Lymph: No head/neck/groin lymphadenopathy Psych: Infusion but no agitation.  No delerium/psychosis/paranoia.  Oriented x 2-4 HENT: Normocephalic, Mucus membranes moist.  No thrush Neck: Supple, No tracheal deviation.  No obvious thyromegaly Chest: No pain to chest wall compression.  Good respiratory excursion.  No audible wheezing CV:  Pulses intact.  Regular rhythm.  No major extremity edema MS: Normal AROM mjr joints.  No obvious deformity  Abdomen: Soft.  Nondistended.  Mildly tender at incisions only at right lower quadrant old ostomy  site.  Old blood on dressing..  No evidence of peritonitis.  No incarcerated hernias.   Ext:   No deformity.  No mjr edema.  No cyanosis Skin: No petechiae / purpurea.  No major sores.  Warm and dry    Results:   Cultures: Recent Results (from the past 720 hours)  Culture, blood (Routine X 2) w Reflex to ID Panel     Status: Abnormal   Collection Time: 03/06/24  1:26 PM   Specimen: Right Antecubital; Blood  Result Value Ref Range Status   Specimen Description   Final    RIGHT ANTECUBITAL Performed at [facility], [address], [place], [place] [address]    Special Requests   Final    BOTTLES DRAWN AEROBIC AND ANAEROBIC Blood Culture adequate volume Performed at [facility], [address], [place], [place] [address]    Culture  Setup Time   Final    GRAM NEGATIVE RODS IN BOTH AEROBIC AND ANAEROBIC BOTTLES CRITICAL RESULT CALLED TO, READ BACK BY AND VERIFIED WITH: PHARMD G ABBOTT 03/08/2024 @ 0305 BY AB Performed at [facility], [address] [place], [place] [address]    Culture KLEBSIELLA PNEUMONIAE (A)  Final   Report Status 03/10/2024 FINAL  Final   Organism ID, Bacteria KLEBSIELLA PNEUMONIAE  Final      Susceptibility   Klebsiella pneumoniae - MIC*    AMPICILLIN  RESISTANT Resistant     CEFAZOLIN (NON-URINE) 2 SENSITIVE Sensitive     CEFEPIME  <=0.12 SENSITIVE Sensitive     ERTAPENEM <=0.12 SENSITIVE Sensitive     CEFTRIAXONE  <=0.25 SENSITIVE Sensitive     CIPROFLOXACIN <=0.06 SENSITIVE Sensitive     GENTAMICIN <=1 SENSITIVE Sensitive     MEROPENEM <=0.25 SENSITIVE Sensitive     TRIMETH/SULFA <=20 SENSITIVE Sensitive     AMPICILLIN /SULBACTAM 4 SENSITIVE Sensitive     PIP/TAZO Value in next row Sensitive      <=4 SENSITIVEThis is a modified FDA-approved test that has been validated and its performance characteristics determined by the reporting laboratory.  This laboratory is certified under the Clinical Laboratory Improvement Amendments CLIA as qualified to perform high complexity clinical laboratory testing.    * KLEBSIELLA PNEUMONIAE  Blood Culture ID Panel (Reflexed)     Status: Abnormal   Collection Time: 03/06/24  1:26 PM  Result Value Ref Range Status   Enterococcus faecalis NOT DETECTED NOT DETECTED Final   Enterococcus Faecium NOT DETECTED NOT DETECTED Final   Listeria monocytogenes NOT DETECTED NOT DETECTED Final   Staphylococcus species NOT DETECTED NOT DETECTED Final   Staphylococcus aureus (BCID) NOT DETECTED NOT DETECTED Final   Staphylococcus epidermidis NOT DETECTED NOT DETECTED Final   Staphylococcus lugdunensis NOT DETECTED NOT DETECTED Final   Streptococcus species NOT DETECTED NOT DETECTED Final   Streptococcus agalactiae  NOT DETECTED NOT DETECTED Final   Streptococcus pneumoniae NOT DETECTED NOT DETECTED Final   Streptococcus pyogenes NOT DETECTED NOT DETECTED Final   A.calcoaceticus-baumannii NOT DETECTED NOT DETECTED Final   Bacteroides fragilis NOT DETECTED NOT DETECTED Final   Enterobacterales DETECTED (A) NOT DETECTED Final    Comment: Enterobacterales represent a large order of gram negative bacteria, not a single organism. CRITICAL RESULT CALLED TO, READ BACK BY AND VERIFIED WITH: PHARMD G ABBOTT 03/08/2024 @ 0305 BY AB    Enterobacter cloacae complex NOT DETECTED NOT DETECTED Final   Escherichia coli NOT DETECTED NOT DETECTED Final   Klebsiella aerogenes NOT DETECTED NOT DETECTED Final  Klebsiella oxytoca NOT DETECTED NOT DETECTED Final   Klebsiella pneumoniae DETECTED (A) NOT DETECTED Final    Comment: CRITICAL RESULT CALLED TO, READ BACK BY AND VERIFIED WITH: PHARMD G ABBOTT 03/08/2024 @ 0305 BY AB    Proteus species NOT DETECTED NOT DETECTED Final   Salmonella species NOT DETECTED NOT DETECTED Final   Serratia marcescens NOT DETECTED NOT DETECTED Final   Haemophilus influenzae NOT DETECTED NOT DETECTED Final   Neisseria meningitidis NOT DETECTED NOT DETECTED Final   Pseudomonas aeruginosa NOT DETECTED NOT DETECTED Final   Stenotrophomonas maltophilia NOT DETECTED NOT DETECTED Final   Candida albicans NOT DETECTED NOT DETECTED Final   Candida auris NOT DETECTED NOT DETECTED Final   Candida glabrata NOT DETECTED NOT DETECTED Final   Candida krusei NOT DETECTED NOT DETECTED Final   Candida parapsilosis NOT DETECTED NOT DETECTED Final   Candida tropicalis NOT DETECTED NOT DETECTED Final   Cryptococcus neoformans/gattii NOT DETECTED NOT DETECTED Final   CTX-M ESBL NOT DETECTED NOT DETECTED Final   Carbapenem resistance IMP NOT DETECTED NOT DETECTED Final   Carbapenem resistance KPC NOT DETECTED NOT DETECTED Final   Carbapenem resistance NDM NOT DETECTED NOT DETECTED Final   Carbapenem  resist OXA 48 LIKE NOT DETECTED NOT DETECTED Final   Carbapenem resistance VIM NOT DETECTED NOT DETECTED Final    Comment: Performed at [facility], [address], [place], [place] [address]  Culture, blood (Routine X 2) w Reflex to ID Panel     Status: None   Collection Time: 03/06/24  1:31 PM   Specimen: BLOOD LEFT FOREARM  Result Value Ref Range Status   Specimen Description   Final    BLOOD LEFT FOREARM Performed at [facility], [address] [place], [place] [address]    Special Requests   Final    BOTTLES DRAWN AEROBIC AND ANAEROBIC Blood Culture adequate volume Performed at [facility], [address], [place], [place] [address]    Culture   Final    NO GROWTH 5 DAYS Performed at [facility], [address], [place], [place] [address]    Report Status 03/11/2024 FINAL  Final  Urine Culture     Status: Abnormal   Collection Time: 03/06/24  3:13 PM   Specimen: Urine, Random  Result Value Ref Range Status   Specimen Description   Final    URINE, RANDOM Performed at [facility], [address] [place], [place] [address]    Special Requests   Final    NONE Reflexed from [id number] Performed at [facility], [address] [place], [place] [address]    Culture >=100,000 COLONIES/mL KLEBSIELLA PNEUMONIAE (A)  Final   Report Status 03/08/2024 FINAL  Final   Organism ID, Bacteria KLEBSIELLA PNEUMONIAE (A)  Final      Susceptibility   Klebsiella pneumoniae - MIC*    AMPICILLIN  RESISTANT Resistant     CEFAZOLIN (URINE) Value in next row Sensitive      2 SENSITIVEThis is a modified FDA-approved test that has been validated and its performance characteristics determined by the reporting laboratory.  This laboratory is certified under the Clinical Laboratory Improvement Amendments CLIA as qualified to perform high complexity clinical laboratory testing.    CEFEPIME  Value in next row Sensitive      2  SENSITIVEThis is a modified FDA-approved test that has been validated and its performance characteristics determined by the reporting laboratory.  This laboratory is  certified under the Clinical Laboratory Improvement Amendments CLIA as qualified to perform high complexity clinical laboratory testing.    ERTAPENEM Value in next row Sensitive      2 SENSITIVEThis is a modified FDA-approved test that has been validated and its performance characteristics determined by the reporting laboratory.  This laboratory is certified under the Clinical Laboratory Improvement Amendments CLIA as qualified to perform high complexity clinical laboratory testing.    CEFTRIAXONE  Value in next row Sensitive      2 SENSITIVEThis is a modified FDA-approved test that has been validated and its performance characteristics determined by the reporting laboratory.  This laboratory is certified under the Clinical Laboratory Improvement Amendments CLIA as qualified to perform high complexity clinical laboratory testing.    CIPROFLOXACIN Value in next row Sensitive      2 SENSITIVEThis is a modified FDA-approved test that has been validated and its performance characteristics determined by the reporting laboratory.  This laboratory is certified under the Clinical Laboratory Improvement Amendments CLIA as qualified to perform high complexity clinical laboratory testing.    GENTAMICIN Value in next row Sensitive      2 SENSITIVEThis is a modified FDA-approved test that has been validated and its performance characteristics determined by the reporting laboratory.  This laboratory is certified under the Clinical Laboratory Improvement Amendments CLIA as qualified to perform high complexity clinical laboratory testing.    NITROFURANTOIN Value in next row Intermediate      2 SENSITIVEThis is a modified FDA-approved test that has been validated and its performance characteristics determined by the reporting laboratory.  This laboratory is  certified under the Clinical Laboratory Improvement Amendments CLIA as qualified to perform high complexity clinical laboratory testing.    TRIMETH/SULFA Value in next row Sensitive      2 SENSITIVEThis is a modified FDA-approved test that has been validated and its performance characteristics determined by the reporting laboratory.  This laboratory is certified under the Clinical Laboratory Improvement Amendments CLIA as qualified to perform high complexity clinical laboratory testing.    AMPICILLIN /SULBACTAM Value in next row Sensitive      2 SENSITIVEThis is a modified FDA-approved test that has been validated and its performance characteristics determined by the reporting laboratory.  This laboratory is certified under the Clinical Laboratory Improvement Amendments CLIA as qualified to perform high complexity clinical laboratory testing.    PIP/TAZO Value in next row Sensitive      <=4 SENSITIVEThis is a modified FDA-approved test that has been validated and its performance characteristics determined by the reporting laboratory.  This laboratory is certified under the Clinical Laboratory Improvement Amendments CLIA as qualified to perform high complexity clinical laboratory testing.    MEROPENEM Value in next row Sensitive      <=4 SENSITIVEThis is a modified FDA-approved test that has been validated and its performance characteristics determined by the reporting laboratory.  This laboratory is certified under the Clinical Laboratory Improvement Amendments CLIA as qualified to perform high complexity clinical laboratory testing.    * >=100,000 COLONIES/mL KLEBSIELLA PNEUMONIAE    Labs: Results for orders placed or performed during the Esc[facility] encounter of 03/06/24 (from the past 48 hours)  Comprehensive metabolic panel with GFR     Status: Abnormal   Collection Time: 03/12/24  2:40 AM  Result Value Ref Range   Sodium 140 135 - 145 mmol/L   Potassium 4.4 3.5 - 5.1 mmol/L   Chloride 104 98 -  111 mmol/L   CO2 23 22 - 32 mmol/L  Glucose, Bld 113 (H) 70 - 99 mg/dL    Comment: Glucose reference range applies only to samples taken after fasting for at least 8 hours.   BUN 18 8 - 23 mg/dL   Creatinine, Ser 7.83 (H) 0.61 - 1.24 mg/dL   Calcium  8.4 (L) 8.9 - 10.3 mg/dL   Total Protein 6.4 (L) 6.5 - 8.1 g/dL   Albumin  2.6 (L) 3.5 - 5.0 g/dL   AST 18 15 - 41 U/L   ALT 8 0 - 44 U/L   Alkaline Phosphatase 59 38 - 126 U/L   Total Bilirubin 0.4 0.0 - 1.2 mg/dL   GFR, Estimated 31 (L) >60 mL/min    Comment: (NOTE) Calculated using the CKD-EPI Creatinine Equation (2021)    Anion gap 13 5 - 15    Comment: Performed at [facility], [address], [place], [place] [address]  CBC with Differential/Platelet     Status: Abnormal   Collection Time: 03/12/24  2:40 AM  Result Value Ref Range   WBC 9.3 4.0 - 10.5 K/uL   RBC 3.51 (L) 4.22 - 5.81 MIL/uL   Hemoglobin 9.4 (L) 13.0 - 17.0 g/dL   HCT 69.3 (L) 60.9 - 47.9 %   MCV 87.2 80.0 - 100.0 fL   MCH 26.8 26.0 - 34.0 pg   MCHC 30.7 30.0 - 36.0 g/dL   RDW 84.6 88.4 - 84.4 %   Platelets 228 150 - 400 K/uL   nRBC 0.0 0.0 - 0.2 %   Neutrophils Relative % 63 %   Neutro Abs 5.8 1.7 - 7.7 K/uL   Lymphocytes Relative 26 %   Lymphs Abs 2.4 0.7 - 4.0 K/uL   Monocytes Relative 6 %   Monocytes Absolute 0.6 0.1 - 1.0 K/uL   Eosinophils Relative 2 %   Eosinophils Absolute 0.2 0.0 - 0.5 K/uL   Basophils Relative 2 %   Basophils Absolute 0.2 (H) 0.0 - 0.1 K/uL   Immature Granulocytes 1 %   Abs Immature Granulocytes 0.09 (H) 0.00 - 0.07 K/uL    Comment: Performed at [facility], [address], [place], [place] [address]  Magnesium      Status: None   Collection Time: 03/12/24  2:40 AM  Result Value Ref Range   Magnesium  1.8 1.7 - 2.4 mg/dL    Comment: Performed at [facility], [address], [place], [place] [address]  Type and screen [facility]     Status: None   Collection Time: 03/12/24  9:11 AM   Result Value Ref Range   ABO/RH(D) O POS    Antibody Screen NEG    Sample Expiration      03/15/2024,2359 Performed at [facility], [address], [place], [place] [address]   Basic metabolic panel with GFR     Status: Abnormal   Collection Time: 03/13/24  3:02 AM  Result Value Ref Range   Sodium 136 135 - 145 mmol/L   Potassium 4.2 3.5 - 5.1 mmol/L   Chloride 106 98 - 111 mmol/L   CO2 21 (L) 22 - 32 mmol/L   Glucose, Bld 123 (H) 70 - 99 mg/dL    Comment: Glucose reference range applies only to samples taken after fasting for at least 8 hours.   BUN 17 8 - 23 mg/dL   Creatinine, Ser 7.98 (H) 0.61 - 1.24 mg/dL   Calcium  8.0 (L) 8.9 - 10.3 mg/dL   GFR, Estimated 34 (L) >  60 mL/min    Comment: (NOTE) Calculated using the CKD-EPI Creatinine Equation (2021)    Anion gap 9 5 - 15    Comment: Performed at [facility], [address] [place], [place] [address]  CBC with Differential/Platelet     Status: Abnormal   Collection Time: 03/13/24  3:02 AM  Result Value Ref Range   WBC 12.2 (H) 4.0 - 10.5 K/uL   RBC 3.26 (L) 4.22 - 5.81 MIL/uL   Hemoglobin 8.7 (L) 13.0 - 17.0 g/dL   HCT 71.1 (L) 60.9 - 47.9 %   MCV 88.3 80.0 - 100.0 fL   MCH 26.7 26.0 - 34.0 pg   MCHC 30.2 30.0 - 36.0 g/dL   RDW 84.6 88.4 - 84.4 %   Platelets 185 150 - 400 K/uL   nRBC 0.0 0.0 - 0.2 %   Neutrophils Relative % 75 %   Neutro Abs 9.2 (H) 1.7 - 7.7 K/uL   Lymphocytes Relative 17 %   Lymphs Abs 2.0 0.7 - 4.0 K/uL   Monocytes Relative 4 %   Monocytes Absolute 0.5 0.1 - 1.0 K/uL   Eosinophils Relative 1 %   Eosinophils Absolute 0.1 0.0 - 0.5 K/uL   Basophils Relative 2 %   Basophils Absolute 0.2 (H) 0.0 - 0.1 K/uL   Immature Granulocytes 1 %   Abs Immature Granulocytes 0.10 (H) 0.00 - 0.07 K/uL    Comment: Performed at [facility], [address] [place], [place] [address]  Phosphorus     Status: None   Collection Time: 03/13/24  3:02 AM  Result Value Ref Range   Phosphorus 4.3  2.5 - 4.6 mg/dL    Comment: Performed at [facility], [address], [place], [place] [address]  Magnesium      Status: Abnormal   Collection Time: 03/13/24  3:02 AM  Result Value Ref Range   Magnesium  1.6 (L) 1.7 - 2.4 mg/dL    Comment: Performed at [facility], [address] [place], [place] [address]    Imaging / Studies: No results found.    Medications / Allergies: per chart  Antibiotics: Anti-infectives (From admission, onward)    Start     Dose/Rate Route Frequency Ordered Stop   03/12/24 2200  cefoTEtan  (CEFOTAN ) 2 g in sodium chloride  0.9 % 100 mL IVPB  Status:  Discontinued        2 g 200 mL/hr over 30 Minutes Intravenous Every 12 hours 03/12/24 1957 03/12/24 2043   03/12/24 0600  cefoTEtan  (CEFOTAN ) 2 g in sodium chloride  0.9 % 100 mL IVPB        2 g 200 mL/hr over 30 Minutes Intravenous On call to O.R. 03/11/24 1206 03/12/24 1550   03/10/24 1315  ceFAZolin (ANCEF) IVPB 2g/100 mL premix        2 g 200 mL/hr over 30 Minutes Intravenous Every 12 hours 03/10/24 1221 03/14/24 1759   03/07/24 1800  cefTRIAXone  (ROCEPHIN ) 2 g in sodium chloride  0.9 % 100 mL IVPB  Status:  Discontinued        2 g 200 mL/hr over 30 Minutes Intravenous Every 24 hours 03/07/24 0842 03/10/24 1221   03/07/24 0930  azithromycin (ZITHROMAX) tablet 500 mg  Status:  Discontinued        500 mg Oral Daily 03/07/24 0842 03/09/24 1046   03/06/24 1800  cefTRIAXone  (ROCEPHIN ) 1 g in sodium chloride  0.9 % 100 mL IVPB  Status:  Discontinued  1 g 200 mL/hr over 30 Minutes Intravenous Every 24 hours 03/06/24 1703 03/07/24 9157         Note: Portions of this report may have been transcribed using voice recognition software. Every effort was made to ensure accuracy; however, inadvertent computerized transcription errors may be present.   Any transcriptional errors that result from this process are unintentional.    Elspeth KYM Schultze, MD, FACS, MASCRS Esophageal, Gastrointestinal &  Colorectal Surgery Robotic and Minimally Invasive Surgery  [facility] [address], [address] [place], [place] [address] [phone] Fax [phone] Main  CONTACT INFORMATION: Weekday (9AM-5PM): Call [facility] main office at [phone] Weeknight (5PM-9AM) or Weekend/Holiday: Check EPIC Web Links tab & use AMION (password  [email or web address]) for General Surgery [facility] coverage  Please, DO NOT use SecureChat  (it is not reliable communication to reach operating surgeons & will lead to a delay in care).   Epic staff messaging available for outptient concerns needing 1-2 business day response.      03/13/2024  6:47 AM

## 2024-03-13 NOTE — Progress Notes (Signed)
 Nutrition Brief Note  Pt diet has been resumed after surgery yesterday. RD has hung and envelope on the door and communicated with RN. RD team will follow-up with results of calorie count on Monday.    Nestora Glatter RD, LDN Clinical Dietitian

## 2024-03-13 NOTE — Progress Notes (Signed)
 PROGRESS NOTE    Calvin Escobar  FMW:[id number] DOB: 1950/06/08 DOA: 03/06/2024 PCP: [name], MD   Brief Narrative:  This 73 yrs old Male with PMH significant for CKD stage III A, anemia of chronic disease, hypertension, recurrent UTIs, chronic HFrEF, chronic indwelling Foley catheter, bipolar disorder, dementia, hyperlipidemia, OSA on CPAP, Morbid obesity, history of urinary retention, ileostomy in place, protein calorie malnutrition , hospitalization for colovesical fistula /sigmoid diverticulitis for which patient underwent robotic rectosigmoid resection with anastomosis, colovesicle fistula takedown and repair, diverting ileostomy and drainage of pelvic abscess and lysis of adhesions in 8/25.[name] He was re admitted at beginning of September this year for sepsis secondary to UTI/gram negative bacteremia and AKI on CKD stage 3a. He presents this admission from his SNF for AMS x 2 days. Found again to be in AKI with metabolic encephalopathy.  He was admitted for further evaluation.  Assessment & Plan:   Principal Problem:   Acute kidney injury superimposed on stage 3a chronic kidney disease (HCC) with hyperkalemia Active Problems:   Acute encephalopathy   Anemia of chronic disease   Hypertension   Recurrent UTI   Chronic kidney disease, stage 3a (HCC)   Diverticulitis of sigmoid colon s/p robotic colectomy 01/09/2024   Chronic HFrEF (heart failure with reduced ejection fraction) (HCC)   Chronic indwelling Foley catheter   Bipolar affective disorder, depressed (HCC)   Dementia (HCC)   GERD (gastroesophageal reflux disease)   HLD (hyperlipidemia)   OSA on CPAP   Hyperkalemia   Morbid obesity (HCC)   Hydronephrosis, right   History of urinary retention   Ileostomy in place [facility])   Malnutrition of moderate degree   Chronic hyponatremia   Protein-calorie malnutrition, severe  Acute kidney injury on CKD stage IIIA: Likely secondary to large volume losses with high output  ostomy. Reversal of ostomy completed  on 03/12/24. Creatinine continues to improve, IV fluid discontinued. Serum creatinine 2.01 today, appears close to baseline.  Hypertension /sinus tachycardia: This seems to be intermittent, he has Foley catheter in place,  he is not confused. Blood pressure is well-controlled, Heart rate  still remains elevated. This could be secondary to pain, continue Coreg  twice daily  Sepsis secondary to Klebsiella UTI: Urine culture confirms 100 K Klebsiella. Continue Ancef 2 g every 12 hours.  Acute Encephalopathy secondary to uremia and polypharmacy: Patient remains back to his baseline mental status as renal functions are improving. Continued nonviolent restraints with safety observation. Now discontinued. Ostomy reversal has been completed. Continue Seroquel 25 mg daily and 50 mg at bedtime for agitation. Continue BuSpar  10 mg TID daily.  Chronic HFr EF: Continue Coreg .  He appears euvolemic.  Chronic indwelling Foley catheter: Patient has history of BPH and urethral stricture s/p dilatation. Urology has evaluated the patient,  they recommended outpatient management options and have signed off.  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 Patient underwent closure ileostomy 10/9. Recommended dysphagia 1 diet. Surgery following. PT/ OT evaluation   DVT prophylaxis: Heparin  Code Status: Full code Family Communication:No family at bedside. Disposition Plan:    Status is: Inpatient Remains inpatient appropriate because: Severity of illness   Consultants:  General Surgery Nephrology   Procedures: Ostomy reversal  Antimicrobials:  Anti-infectives (From admission, onward)    Start     Dose/Rate Route Frequency Ordered Stop   03/12/24 2200  cefoTEtan  (CEFOTAN ) 2 g in sodium chloride  0.9 % 100 mL IVPB  Status:  Discontinued  2 g 200 mL/hr over 30 Minutes Intravenous Every 12 hours 03/12/24  1957 03/12/24 2043   03/12/24 0600  cefoTEtan  (CEFOTAN ) 2 g in sodium chloride  0.9 % 100 mL IVPB        2 g 200 mL/hr over 30 Minutes Intravenous On call to O.R. 03/11/24 1206 03/12/24 1550   03/10/24 1315  ceFAZolin (ANCEF) IVPB 2g/100 mL premix        2 g 200 mL/hr over 30 Minutes Intravenous Every 12 hours 03/10/24 1221 03/14/24 1759   03/07/24 1800  cefTRIAXone  (ROCEPHIN ) 2 g in sodium chloride  0.9 % 100 mL IVPB  Status:  Discontinued        2 g 200 mL/hr over 30 Minutes Intravenous Every 24 hours 03/07/24 0842 03/10/24 1221   03/07/24 0930  azithromycin (ZITHROMAX) tablet 500 mg  Status:  Discontinued        500 mg Oral Daily 03/07/24 0842 03/09/24 1046   03/06/24 1800  cefTRIAXone  (ROCEPHIN ) 1 g in sodium chloride  0.9 % 100 mL IVPB  Status:  Discontinued        1 g 200 mL/hr over 30 Minutes Intravenous Every 24 hours 03/06/24 1703 03/07/24 0842       Subjective: Patient was seen and examined at bedside.  Overnight events noted. Ostomy has been reversed 10/9.  Patient has dressing noted on the right side of the abdomen.   He continued to complain about pain, He appears alert,  seems back to his baseline mental status.  Objective: Vitals:   03/13/24 0131 03/13/24 0500 03/13/24 0520 03/13/24 0825  BP:   133/78 123/81  Pulse:   (!) 112 (!) 109  Resp: 20  16 17   Temp:    98.6 F (37 C)  TempSrc:      SpO2:   96% 99%  Weight:  91.3 kg    Height:        Intake/Output Summary (Last 24 hours) at 03/13/2024 1100 Last data filed at 03/13/2024 0546 Gross per 24 hour  Intake 1100 ml  Output 1225 ml  Net -125 ml   Filed Weights   03/11/24 0500 03/12/24 0500 03/13/24 0500  Weight: 90.6 kg 90.2 kg 91.3 kg    Examination:  General exam: Appears calm and comfortable, not in any acute distress. Respiratory system: CTA Bilaterally. Respiratory effort normal. RR 17 Cardiovascular system: S1 & S2 heard, RRR. No JVD, murmurs, rubs, gallops or clicks.  Gastrointestinal system:  Abdomen is non distended, soft and Mildly tender.  Dressing noted, Normal bowel sounds heard. Central nervous system: Alert and oriented x 3. No focal neurological deficits. Extremities: No edema, no cyanosis, no clubbing. Skin: No rashes, lesions or ulcers Psychiatry: Judgement and insight appear normal. Mood & affect appropriate.     Data Reviewed: I have personally reviewed following labs and imaging studies  CBC: Recent Labs  Lab 03/09/24 0333 03/10/24 0403 03/11/24 0259 03/12/24 0240 03/13/24 0302  WBC 7.3 10.2 9.2 9.3 12.2*  NEUTROABS 4.8 6.7 5.9 5.8 9.2*  HGB 7.6* 8.6* 8.6* 9.4* 8.7*  HCT 24.4* 27.7* 28.1* 30.6* 28.8*  MCV 84.4 84.7 87.0 87.2 88.3  PLT 227 275 245 228 185   Basic Metabolic Panel: Recent Labs  Lab 03/07/24 0551 03/08/24 0133 03/09/24 0333 03/10/24 0403 03/11/24 0259 03/12/24 0240 03/13/24 0302  NA  --    < > 133* 138 139 140 136  K  --    < > 3.9 4.2 4.4 4.4 4.2  CL  --    < >  100 104 102 104 106  CO2  --    < > 23 25 24 23  21*  GLUCOSE  --    < > 109* 115* 107* 113* 123*  BUN  --    < > 42* 31* 25* 18 17  CREATININE  --    < > 2.98* 2.61* 2.40* 2.16* 2.01*  CALCIUM   --    < > 7.7* 8.5* 8.5* 8.4* 8.0*  MG 1.6*   < > 1.6* 2.3 2.1 1.8 1.6*  PHOS 5.2*  --   --  3.3  --   --  4.3   < > = values in this interval not displayed.   GFR: Estimated Creatinine Clearance: 34.9 mL/min (A) (by C-G formula based on SCr of 2.01 mg/dL (H)). Liver Function Tests: Recent Labs  Lab 03/08/24 0133 03/09/24 0333 03/10/24 0403 03/11/24 0259 03/12/24 0240  AST <10* 11* 19 18 18   ALT 8 9 9 9 8   ALKPHOS 53 54 56 58 59  BILITOT 0.5 0.5 0.5 0.5 0.4  PROT 5.4* 5.5* 6.5 6.1* 6.4*  ALBUMIN  1.9* 2.0* 2.7* 2.5* 2.6*   No results for input(s): LIPASE, AMYLASE in the last 168 hours. Recent Labs  Lab 03/06/24 1449 03/07/24 0551 03/08/24 0133 03/09/24 0333  AMMONIA 15 20 16 21    Coagulation Profile: No results for input(s): INR, PROTIME in the last  168 hours. Cardiac Enzymes: No results for input(s): CKTOTAL, CKMB, CKMBINDEX, TROPONINI in the last 168 hours. BNP (last 3 results) No results for input(s): PROBNP in the last 8760 hours. HbA1C: No results for input(s): HGBA1C in the last 72 hours. CBG: Recent Labs  Lab 03/06/24 1721  GLUCAP 78   Lipid Profile: No results for input(s): CHOL, HDL, LDLCALC, TRIG, CHOLHDL, LDLDIRECT in the last 72 hours. Thyroid  Function Tests: No results for input(s): TSH, T4TOTAL, FREET4, T3FREE, THYROIDAB in the last 72 hours. Anemia Panel: No results for input(s): VITAMINB12, FOLATE, FERRITIN, TIBC, IRON , RETICCTPCT in the last 72 hours. Sepsis Labs: Recent Labs  Lab 03/06/24 1407 03/06/24 1454 03/07/24 0413 03/08/24 0133 03/09/24 0333  PROCALCITON 3.33  --  3.08 1.59 0.72  LATICACIDVEN  --  0.8  --   --   --     Recent Results (from the past 240 hours)  Culture, blood (Routine X 2) w Reflex to ID Panel     Status: Abnormal   Collection Time: 03/06/24  1:26 PM   Specimen: Right Antecubital; Blood  Result Value Ref Range Status   Specimen Description   Final    RIGHT ANTECUBITAL Performed at [facility], [address], [place], [place] [address]    Special Requests   Final    BOTTLES DRAWN AEROBIC AND ANAEROBIC Blood Culture adequate volume Performed at [facility], [address] [place], [place] [address]    Culture  Setup Time   Final    GRAM NEGATIVE RODS IN BOTH AEROBIC AND ANAEROBIC BOTTLES CRITICAL RESULT CALLED TO, READ BACK BY AND VERIFIED WITH: PHARMD G ABBOTT 03/08/2024 @ 0305 BY AB Performed at [facility], [address] [place], [place] [address]    Culture KLEBSIELLA PNEUMONIAE (A)  Final   Report Status 03/10/2024 FINAL  Final   Organism ID, Bacteria KLEBSIELLA PNEUMONIAE  Final      Susceptibility   Klebsiella pneumoniae - MIC*    AMPICILLIN  RESISTANT Resistant     CEFAZOLIN  (NON-URINE) 2 SENSITIVE Sensitive     CEFEPIME  <=  0.12 SENSITIVE Sensitive     ERTAPENEM <=0.12 SENSITIVE Sensitive     CEFTRIAXONE  <=0.25 SENSITIVE Sensitive     CIPROFLOXACIN <=0.06 SENSITIVE Sensitive     GENTAMICIN <=1 SENSITIVE Sensitive     MEROPENEM <=0.25 SENSITIVE Sensitive     TRIMETH/SULFA <=20 SENSITIVE Sensitive     AMPICILLIN /SULBACTAM 4 SENSITIVE Sensitive     PIP/TAZO Value in next row Sensitive      <=4 SENSITIVEThis is a modified FDA-approved test that has been validated and its performance characteristics determined by the reporting laboratory.  This laboratory is certified under the Clinical Laboratory Improvement Amendments CLIA as qualified to perform high complexity clinical laboratory testing.    * KLEBSIELLA PNEUMONIAE  Blood Culture ID Panel (Reflexed)     Status: Abnormal   Collection Time: 03/06/24  1:26 PM  Result Value Ref Range Status   Enterococcus faecalis NOT DETECTED NOT DETECTED Final   Enterococcus Faecium NOT DETECTED NOT DETECTED Final   Listeria monocytogenes NOT DETECTED NOT DETECTED Final   Staphylococcus species NOT DETECTED NOT DETECTED Final   Staphylococcus aureus (BCID) NOT DETECTED NOT DETECTED Final   Staphylococcus epidermidis NOT DETECTED NOT DETECTED Final   Staphylococcus lugdunensis NOT DETECTED NOT DETECTED Final   Streptococcus species NOT DETECTED NOT DETECTED Final   Streptococcus agalactiae NOT DETECTED NOT DETECTED Final   Streptococcus pneumoniae NOT DETECTED NOT DETECTED Final   Streptococcus pyogenes NOT DETECTED NOT DETECTED Final   A.calcoaceticus-baumannii NOT DETECTED NOT DETECTED Final   Bacteroides fragilis NOT DETECTED NOT DETECTED Final   Enterobacterales DETECTED (A) NOT DETECTED Final    Comment: Enterobacterales represent a large order of gram negative bacteria, not a single organism. CRITICAL RESULT CALLED TO, READ BACK BY AND VERIFIED WITH: PHARMD G ABBOTT 03/08/2024 @ 0305 BY AB    Enterobacter cloacae  complex NOT DETECTED NOT DETECTED Final   Escherichia coli NOT DETECTED NOT DETECTED Final   Klebsiella aerogenes NOT DETECTED NOT DETECTED Final   Klebsiella oxytoca NOT DETECTED NOT DETECTED Final   Klebsiella pneumoniae DETECTED (A) NOT DETECTED Final    Comment: CRITICAL RESULT CALLED TO, READ BACK BY AND VERIFIED WITH: PHARMD G ABBOTT 03/08/2024 @ 0305 BY AB    Proteus species NOT DETECTED NOT DETECTED Final   Salmonella species NOT DETECTED NOT DETECTED Final   Serratia marcescens NOT DETECTED NOT DETECTED Final   Haemophilus influenzae NOT DETECTED NOT DETECTED Final   Neisseria meningitidis NOT DETECTED NOT DETECTED Final   Pseudomonas aeruginosa NOT DETECTED NOT DETECTED Final   Stenotrophomonas maltophilia NOT DETECTED NOT DETECTED Final   Candida albicans NOT DETECTED NOT DETECTED Final   Candida auris NOT DETECTED NOT DETECTED Final   Candida glabrata NOT DETECTED NOT DETECTED Final   Candida krusei NOT DETECTED NOT DETECTED Final   Candida parapsilosis NOT DETECTED NOT DETECTED Final   Candida tropicalis NOT DETECTED NOT DETECTED Final   Cryptococcus neoformans/gattii NOT DETECTED NOT DETECTED Final   CTX-M ESBL NOT DETECTED NOT DETECTED Final   Carbapenem resistance IMP NOT DETECTED NOT DETECTED Final   Carbapenem resistance KPC NOT DETECTED NOT DETECTED Final   Carbapenem resistance NDM NOT DETECTED NOT DETECTED Final   Carbapenem resist OXA 48 LIKE NOT DETECTED NOT DETECTED Final   Carbapenem resistance VIM NOT DETECTED NOT DETECTED Final    Comment: Performed at [facility], [address], [place], [place] [address]  Culture, blood (Routine X 2) w Reflex to ID Panel     Status: None  Collection Time: 03/06/24  1:31 PM   Specimen: BLOOD LEFT FOREARM  Result Value Ref Range Status   Specimen Description   Final    BLOOD LEFT FOREARM Performed at [facility], [address] [place], [place] [address]    Special Requests   Final    BOTTLES DRAWN  AEROBIC AND ANAEROBIC Blood Culture adequate volume Performed at [facility], [address] [place], [place] [address]    Culture   Final    NO GROWTH 5 DAYS Performed at [facility], [address], [place], [place] [address]    Report Status 03/11/2024 FINAL  Final  Urine Culture     Status: Abnormal   Collection Time: 03/06/24  3:13 PM   Specimen: Urine, Random  Result Value Ref Range Status   Specimen Description   Final    URINE, RANDOM Performed at [facility] [address], [place], [place] [address]    Special Requests   Final    NONE Reflexed from [id number] Performed at [facility], [address], [place], [place] [address]    Culture >=100,000 COLONIES/mL KLEBSIELLA PNEUMONIAE (A)  Final   Report Status 03/08/2024 FINAL  Final   Organism ID, Bacteria KLEBSIELLA PNEUMONIAE (A)  Final      Susceptibility   Klebsiella pneumoniae - MIC*    AMPICILLIN  RESISTANT Resistant     CEFAZOLIN (URINE) Value in next row Sensitive      2 SENSITIVEThis is a modified FDA-approved test that has been validated and its performance characteristics determined by the reporting laboratory.  This laboratory is certified under the Clinical Laboratory Improvement Amendments CLIA as qualified to perform high complexity clinical laboratory testing.    CEFEPIME  Value in next row Sensitive      2 SENSITIVEThis is a modified FDA-approved test that has been validated and its performance characteristics determined by the reporting laboratory.  This laboratory is certified under the Clinical Laboratory Improvement Amendments CLIA as qualified to perform high complexity clinical laboratory testing.    ERTAPENEM Value in next row Sensitive      2 SENSITIVEThis is a modified FDA-approved test that has been validated and its performance characteristics determined by the reporting laboratory.  This laboratory is certified under the Clinical  Laboratory Improvement Amendments CLIA as qualified to perform high complexity clinical laboratory testing.    CEFTRIAXONE  Value in next row Sensitive      2 SENSITIVEThis is a modified FDA-approved test that has been validated and its performance characteristics determined by the reporting laboratory.  This laboratory is certified under the Clinical Laboratory Improvement Amendments CLIA as qualified to perform high complexity clinical laboratory testing.    CIPROFLOXACIN Value in next row Sensitive      2 SENSITIVEThis is a modified FDA-approved test that has been validated and its performance characteristics determined by the reporting laboratory.  This laboratory is certified under the Clinical Laboratory Improvement Amendments CLIA as qualified to perform high complexity clinical laboratory testing.    GENTAMICIN Value in next row Sensitive      2 SENSITIVEThis is a modified FDA-approved test that has been validated and its performance characteristics determined by the reporting laboratory.  This laboratory is certified under the Clinical Laboratory Improvement Amendments CLIA as qualified to perform high complexity clinical laboratory testing.    NITROFURANTOIN Value in next row Intermediate      2 SENSITIVEThis is a modified FDA-approved test that has been validated  and its performance characteristics determined by the reporting laboratory.  This laboratory is certified under the Clinical Laboratory Improvement Amendments CLIA as qualified to perform high complexity clinical laboratory testing.    TRIMETH/SULFA Value in next row Sensitive      2 SENSITIVEThis is a modified FDA-approved test that has been validated and its performance characteristics determined by the reporting laboratory.  This laboratory is certified under the Clinical Laboratory Improvement Amendments CLIA as qualified to perform high complexity clinical laboratory testing.    AMPICILLIN /SULBACTAM Value in next row Sensitive       2 SENSITIVEThis is a modified FDA-approved test that has been validated and its performance characteristics determined by the reporting laboratory.  This laboratory is certified under the Clinical Laboratory Improvement Amendments CLIA as qualified to perform high complexity clinical laboratory testing.    PIP/TAZO Value in next row Sensitive      <=4 SENSITIVEThis is a modified FDA-approved test that has been validated and its performance characteristics determined by the reporting laboratory.  This laboratory is certified under the Clinical Laboratory Improvement Amendments CLIA as qualified to perform high complexity clinical laboratory testing.    MEROPENEM Value in next row Sensitive      <=4 SENSITIVEThis is a modified FDA-approved test that has been validated and its performance characteristics determined by the reporting laboratory.  This laboratory is certified under the Clinical Laboratory Improvement Amendments CLIA as qualified to perform high complexity clinical laboratory testing.    * >=100,000 COLONIES/mL KLEBSIELLA PNEUMONIAE    Radiology Studies: No results found.  Scheduled Meds:  acetaminophen   500 mg Oral TID WC & HS   alvimopan   12 mg Oral BID   busPIRone   10 mg Oral TID   carvedilol   3.125 mg Oral BID WC   Chlorhexidine  Gluconate Cloth  6 each Topical Daily   Chlorhexidine  Gluconate Cloth  6 each Topical Once   feeding supplement  237 mL Oral TID BM   finasteride   5 mg Oral Daily   folic acid   1 mg Oral Daily   heparin   5,000 Units Subcutaneous Q8H   melatonin  5 mg Oral QHS   multivitamin with minerals  1 tablet Oral Daily   pantoprazole   40 mg Oral Daily   polycarbophil  625 mg Oral BID   QUEtiapine  25 mg Oral Q0600   QUEtiapine  50 mg Oral QHS   sodium chloride  flush  3 mL Intravenous Q12H   tamsulosin   0.4 mg Oral Daily   Continuous Infusions:  sodium chloride       ceFAZolin (ANCEF) IV 2 g (03/13/24 0535)   lactated ringers      lactated ringers       magnesium  sulfate bolus IVPB       LOS: 7 days    Time spent: 50 mins    Darcel Dawley, MD [facility]   If 7PM-7AM, please contact night-coverage

## 2024-03-13 NOTE — Evaluation (Signed)
 Clinical/Bedside Swallow Evaluation Patient Details  Name: Calvin Escobar MRN: [id number] Date of Birth: [birth year]  Today's Date: 03/13/2024 Time: SLP Start Time (ACUTE ONLY): 0902 SLP Stop Time (ACUTE ONLY): 9081 SLP Time Calculation (min) (ACUTE ONLY): 16 min  Past Medical History:  Past Medical History:  Diagnosis Date   Acute cystitis 05/13/2023   Acute hyponatremia 05/13/2023   Acute kidney injury superimposed on chronic kidney disease 05/13/2023   CLINICAL CONTEXT: 73 year old male with stage 3 CKD, colovesical fistula, recurrent UTIs, and recent candidal UTI. Recent renal ultrasound (09/28/2023) confirmed mild left hydronephrosis and prostatic mass protruding into bladder base. Iron  studies consistent with anemia of chronic disease (iron  32, TIBC 238, ferritin 420).   DIAGNOSTIC ASSESSMENT: ? 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 History of GI bleeding (07/2023). Currently on ferrous sulfate  supplementation.   DIAGNOSTIC ASSESSMENT: ? Primary etiology (70-80% probability): Anemia of chronic disease/inflammation ? 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  EF 35 to 40% on echo 04/2021   Dementia (HCC)    Depression    Diarrhea 05/13/2023   E coli bacteremia 05/14/2023   High anion gap metabolic acidosis 07/13/2023   History of Clostridioides difficile colitis 08/15/2023   Associated with colovesical fistula? Recurrent antibiotic(s) requirement     Hyperkalemia    Associated with losartan   Advised patient low potassium diet 08/2022             Lab Results      Component    Value    Date/Time           K    4.0    12/27/2023 01:20 PM           K    4.4    12/18/2023 05:07 AM           K    4.4    12/17/2023 06:01 AM           K    4.7    12/16/2023 06:15 AM           K    4.9    12/15/2023 08:21 AM           K    4.4    12/14/2023 06:00 AM           K    5.0    Hyperlipidemia    Hypertension    Leukocytosis 12/11/2023   Lab Results      Component    Value    Date/Time           WBC    6.2    12/27/2023 01:20 PM           WBC  11.6 (H)    12/18/2023 05:07 AM           WBC    9.6    12/17/2023 06:01 AM           WBC    9.4    12/16/2023 06:15 AM           WBC    12.7 (H)    12/15/2023 08:21 AM           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  Work-up with cardiology in January 2021.     catheterization    Assessment:     1. Normal coronaries  2. NICM with EF 25-30%  3. Normal hemodynamics  .     Pneumonia    PUD (peptic ulcer disease) 01/12/2015   Formatting of this note might be different from the original.  Endoscopy esophageal stricture 2016     Septic shock from UTI 06/02/2023   Severe sepsis (HCC) 06/03/2023   SIRS (systemic inflammatory response syndrome) (HCC) 12/11/2023   SKIN CANCER, HX OF 05/10/2007   Facial left check and forehead and r shoulder  F/w derm   Syncope 05/13/2023   Thrombocytopenia 06/02/2023   Lab Results      Component    Value    Date           PLT    301    07/19/2023           PLT     324    07/18/2023           PLT    268    07/16/2023           PLT    207    07/14/2023           PLT    228    07/13/2023             Lab Results      Component    Value    Date           ESRSEDRATE    60 (H)    05/08/2023     No results found for: CRP          Lab Results      Component    Value       Urinary catheter complication 06/24/2023   Urinary dribbling 03/14/2022   Urinary incontinence 11/21/2022      Urinary Incontinence: They are wearing disposable diapers daily due to urinary incontinence, primarily nocturnal, and are currently on Tamsulosin  (Flomax ). We will refer them to Urology for further evaluation and potential treatment options and continue Tamsulosin  until they are seen by Urology.     Urinary tract infection without hematuria 06/02/2023   UTI (urinary tract infection) 07/01/2023   Past Surgical History:  Past Surgical History:  Procedure Laterality Date   COLECTOMY, SIGMOID, ROBOT-ASSISTED N/A 01/09/2024   Procedure: ROBOTIC LOW ANTERIOR RESECTION WITH ANASTOMOSIS, DRAINAGE OF PELVIC ABSCESS, COLOVESICAL FISTULA TAKEDOWN AND REPAIR, INTRAOPERATIVE ASSESSMENT OF TISSUE PERFUSION USING ICG DYE, AND BILATERAL TAP BLOCK;  Surgeon: Sheldon Standing, MD;  Location: [facility];  Service: General;  Laterality: N/A;  ROBOTIC RESECTION OF COLON RECTOSIGMOID   COLONOSCOPY     COLONOSCOPY N/A 12/05/2023   Procedure: COLONOSCOPY;  Surgeon: Leigh Standing SQUIBB, MD;  Location: [facility];  Service: Gastroenterology;  Laterality: N/A;   CYSTOSCOPY WITH INDOCYANINE GREEN  IMAGING (ICG) N/A 01/09/2024   Procedure: CYSTOSCOPY WITH INDOCYANINE GREEN  IMAGING (ICG);  Surgeon: Watt Rush, MD;  Location: [facility];  Service: Urology;  Laterality: N/A;  CYSTO WITH FIREFLY INJECTION   FLEXIBLE SIGMOIDOSCOPY N/A 01/09/2024   Procedure: SIGMOIDOSCOPY, FLEXIBLE;  Surgeon: Sheldon Standing, MD;  Location: [facility];  Service: General;  Laterality: N/A;   LAPAROSCOPIC LOOP COLOSTOMY N/A 01/09/2024   Procedure: CREATION OF  DIVERTING LOOP ILEOSTOMY;  Surgeon: Sheldon Standing, MD;  Location: [facility];  Service: General;  Laterality: N/A;   RIGHT/LEFT HEART CATH AND CORONARY ANGIOGRAPHY N/A 07/03/2019   Procedure: RIGHT/LEFT HEART CATH AND CORONARY ANGIOGRAPHY;  Surgeon: Cherrie Toribio SAUNDERS, MD;  Location: [facility];  Service: Cardiovascular;  Laterality: N/A;   ROBOTIC ASSISTED LAPAROSCOPIC LYSIS OF ADHESION  01/09/2024   Procedure: LYSIS, ADHESIONS, ROBOT-ASSISTED, LAPAROSCOPIC;  Surgeon: Sheldon Standing, MD;  Location: [facility];  Service: General;;   HPI:  Pt is a 73 y.o. male who presented from SNF for AMS x 2 days.  Found to be in AKI with metabolic encephalopathy and admitted. CXR (03/07/24) revealed, Subtle nodular density in the left mid lung may reflect a confluence of shadows. Pneumonia or underlying soft tissue lesion not excluded. Pt has had prior UGI study in 04/2021 that showed  Mild esophageal dysmotility, tiny hiatal herni and a fair amount of residual contrast in the oropharynx. From review of flouroscopy loops from UGI study, pt with appearance of secretions retained in pharynx that mixed with barium. BSE (06/06/23) noted, 3 ounce Yale water  challenge conducted at end of testing - resulting in pt overtly coughing - copiously and having mildly increased work of breathing... Suspect his primary risk is his known dysmotility exacerbated by his dyspnea. Recommend continue dys2/thin for now  BSE 03/07/24 recommended Dys 2/thin. Follow up recommended continue Dys 2/thin for duration of hospital stay and f/u at SNF and d/c'd ST. (wife reported pt does not have dentures). Pt underwent CLOSURE, ILEOSTOMY on 10/9 and GI doc initiated Dys 1 (puree) and reordered ST for swallow and cognitive eval.  PMH: esophageal dysmotility, tiny hiatal hernia, CKD stage 3a, hx of VRE, HTN, HLD, OSA on cpap, ACD, dementia, bipolar, anxiety and depression, chronic indwelling foley catheter, non ischemic CM with HFrEF, hospitalization for  colovesical fistula/sigmoid diveritculitis for which he underwent robotic rectosigmoid resection with anastomosis, colovesical fistula takedown and repair, diverting ileostomy, drainage of pelvic abscess and lysis of adhesions in 01/2024    Assessment / Plan / Recommendation  Clinical Impression  Pt known to this SLP from dysphagia session 10/6 recommending pt remain on Dys 2 for duration of hospitalization with f/u at SNF and signed off (does not wear dentures- spoke with wife). Reconsult from GI following surgical procedure yesterday (closure, ileostomy). Today he was cooperative, not oriented to place, Continues with functional oromotor abilities. He does not have upper dentition, missing posterior bilateral and wife states does not wear dentures. Today he continues to exhibit prolonged mastication with consistent lingual residue with graham cracker. He requests liquid which aids in transit and clears oral cavity. There were no s/s aspiration with multiple cup or straw sips thin. Recommend continue puree (Dys 1), thin and pills whole in applesauce. GI note states to advance to Dys 3 when able however recommend ccontinuing on puree until ST follows up for possible advancement (even then he may not be able to advance past Dys 2). SLP Visit Diagnosis: Dysphagia,  oral phase (R13.11)    Aspiration Risk  Mild aspiration risk    Diet Recommendation Dysphagia 1 (Puree);Thin liquid    Liquid Administration via: Cup;Straw Medication Administration: Whole meds with puree Supervision: Staff to assist with self feeding;Full supervision/cueing for compensatory strategies Compensations: Minimize environmental distractions;Slow rate;Small sips/bites Postural Changes: Seated upright at 90 degrees    Other  Recommendations Oral Care Recommendations: Oral care BID     Assistance Recommended at Discharge    Functional Status Assessment Patient has had a recent decline in their functional status and demonstrates  the ability to make significant improvements in function in a reasonable and predictable amount of time.  Frequency and Duration min 2x/week  2 weeks       Prognosis Prognosis for improved oropharyngeal function: Good Barriers to Reach Goals: Cognitive deficits      Swallow Study   General HPI: Pt is a 73 y.o. male who presented from SNF for AMS x 2 days.  Found to be in AKI with metabolic encephalopathy and admitted. CXR (03/07/24) revealed, Subtle nodular density in the left mid lung may reflect a confluence of shadows. Pneumonia or underlying soft tissue lesion not excluded. Pt has had prior UGI study in 04/2021 that showed  Mild esophageal dysmotility, tiny hiatal herni and a fair amount of residual contrast in the oropharynx. From review of flouroscopy loops from UGI study, pt with appearance of secretions retained in pharynx that mixed with barium. BSE (06/06/23) noted, 3 ounce Yale water  challenge conducted at end of testing - resulting in pt overtly coughing - copiously and having mildly increased work of breathing... Suspect his primary risk is his known dysmotility exacerbated by his dyspnea. Recommend continue dys2/thin for now  BSE 03/07/24 recommended Dys 2/thin. Follow up recommended continue Dys 2/thin for duration of hospital stay and f/u at SNF and d/c'd ST. (wife reported pt does not have dentures). Pt underwent CLOSURE, ILEOSTOMY on 10/9 and GI doc initiated Dys 1 (puree) and reordered ST for swallow and cognitive eval.  PMH: esophageal dysmotility, tiny hiatal hernia, CKD stage 3a, hx of VRE, HTN, HLD, OSA on cpap, ACD, dementia, bipolar, anxiety and depression, chronic indwelling foley catheter, non ischemic CM with HFrEF, hospitalization for colovesical fistula/sigmoid diveritculitis for which he underwent robotic rectosigmoid resection with anastomosis, colovesical fistula takedown and repair, diverting ileostomy, drainage of pelvic abscess and lysis of adhesions in 01/2024 Type of  Study: Bedside Swallow Evaluation Previous Swallow Assessment: see HPI Diet Prior to this Study: Dysphagia 1 (pureed);Thin liquids (Level 0) Temperature Spikes Noted: No Respiratory Status: Room air History of Recent Intubation: No Behavior/Cognition: Alert;Cooperative;Pleasant mood;Confused;Requires cueing Oral Cavity Assessment: Dry Oral Care Completed by SLP: Yes Oral Cavity - Dentition: Missing dentition (no upper dentition, missing posterior lower, does not have dentures) Self-Feeding Abilities: Needs assist Patient Positioning: Upright in bed Baseline Vocal Quality: Normal Volitional Cough: Weak Volitional Swallow: Able to elicit    Oral/Motor/Sensory Function Overall Oral Motor/Sensory Function: Within functional limits   Ice Chips Ice chips: Not tested   Thin Liquid Thin Liquid: Within functional limits Presentation: Cup;Straw    Nectar Thick Nectar Thick Liquid: Not tested   Honey Thick Honey Thick Liquid: Not tested   Puree Puree: Within functional limits   Solid     Solid: Impaired Oral Phase Functional Implications: Oral residue;Impaired mastication Pharyngeal Phase Impairments:  (none)      Aracelis Ulrey, Olam Bull 03/13/2024,9:57 AM

## 2024-03-13 NOTE — Progress Notes (Signed)
 Physical Therapy Treatment Patient Details Name: Calvin Escobar MRN: [id number] DOB: [birth year] Today's Date: 03/13/2024   History of Present Illness 73 y.o. male presents to [facility] from SNF with AMS and hypotension. Admitted with AKI w/ hyperkalemia, UTI, and acute encephalopathy.  PMHx: CKD stage 3a, hx of VRE, HTN, HLD, OSA on cpap, ACD, dementia, bipolar, anxiety and depression, chronic indwelling foley catheter, non ischemic CM with HFrEF, colovesical fistula/sigmoid diveritculitis, dementia.    PT Comments  Pt greeted supine in bed, pleasant and agreeable to PT session. He is making steady progress towards his acute PT goals demonstrated by advanced functional mobility with decreased physical assistance. Pt ambulated within the room using RW with min-modA x2 for safety and line management. He required assist to maneuver AD, navigate obstacles, and maintain safety awareness. Pt completed sit<>stand x3 reps. As he fatigued, pt required increased physical assist. Pt denied dizziness, lightheadedness, and pain. Patient will benefit from continued inpatient follow up therapy, <3 hours/day.    If plan is discharge home, recommend the following: A lot of help with bathing/dressing/bathroom;Assistance with cooking/housework;Direct supervision/assist for medications management;Direct supervision/assist for financial management;Assist for transportation;Help with stairs or ramp for entrance;Two people to help with walking and/or transfers   Can travel by private vehicle     No  Equipment Recommendations  Hospital bed;Hoyer lift;Wheelchair (measurements PT);Wheelchair cushion (measurements PT)    Recommendations for Other Services       Precautions / Restrictions Precautions Precautions: Fall Recall of Precautions/Restrictions: Impaired Precaution/Restrictions Comments: Soil scientist Restrictions [employer] Bearing Restrictions Per Provider Order: No     Mobility  Bed Mobility Overal bed  mobility: Needs Assistance Bed Mobility: Supine to Sit     Supine to sit: Min assist, HOB elevated, Used rails, +2 for safety/equipment     General bed mobility comments: Pt sat up on L side of bed with increased time. Cues for sequencing. Assist to bring BLE off EOB, elevate trunk, and scoot hips fwd with bed pad. Mobility Specialists aided in line management.    Transfers Overall transfer level: Needs assistance Equipment used: Rolling walker (2 wheels) Transfers: Sit to/from Stand Sit to Stand: Min assist, Mod assist, +2 physical assistance           General transfer comment: Pt stood from lowest bed height and recliner chair. Cued proper hand placement using RW. Powered up with minA x2. On last stand attempt he required modA x2, likely d/t fatigue. Fair eccentric control.    Ambulation/Gait Ambulation/Gait assistance: Min assist, Mod assist, +2 safety/equipment Gait Distance (Feet): 15 Feet Assistive device: Rolling walker (2 wheels) Gait Pattern/deviations: Step-to pattern, Decreased step length - right, Decreased step length - left, Decreased stride length, Shuffle Gait velocity: decreased     General Gait Details: Pt ambulated around bed to recliner chair. He took short slow steps lacking foot clearence. Pt demonstrated a downward gaze preference. Assist to safely manuever RW. Assist for line management. Cues for sequencing and increased safety awareness. Pt declined to ambulate further as his meal tray arrived.   Stairs             Wheelchair Mobility     Tilt Bed    Modified Rankin (Stroke Patients Only)       Balance Overall balance assessment: Needs assistance Sitting-balance support: Feet supported, Bilateral upper extremity supported Sitting balance-Leahy Scale: Fair Sitting balance - Comments: Pt sat EOB with close supervision.   Standing balance support: Bilateral upper extremity supported, During functional activity, Reliant on  assistive  device for balance Standing balance-Leahy Scale: Poor Standing balance comment: Pt dependent on RW and +2 assist                            Communication Communication Communication: No apparent difficulties  Cognition Arousal: Alert Behavior During Therapy: Flat affect   PT - Cognitive impairments: History of cognitive impairments, No family/caregiver present to determine baseline (Dementia)                         Following commands: Impaired Following commands impaired: Follows one step commands inconsistently    Cueing Cueing Techniques: Verbal cues, Tactile cues  Exercises      General Comments General comments (skin integrity, edema, etc.): VSS on RA      Pertinent Vitals/Pain Pain Assessment Pain Assessment: PAINAD Pain Score: 0-No pain Faces Pain Scale: No hurt Breathing: normal Negative Vocalization: none Facial Expression: smiling or inexpressive Body Language: relaxed Consolability: no need to console PAINAD Score: 0 Pain Intervention(s): Monitored during session    Home Living                          Prior Function            PT Goals (current goals can now be found in the care plan section) Acute Rehab PT Goals Patient Stated Goal: Feel better PT Goal Formulation: With patient Time For Goal Achievement: 03/21/24 Potential to Achieve Goals: Good Progress towards PT goals: Progressing toward goals    Frequency    Min 1X/week      PT Plan      Co-evaluation              AM-PAC PT 6 Clicks Mobility   Outcome Measure  Help needed turning from your back to your side while in a flat bed without using bedrails?: A Little Help needed moving from lying on your back to sitting on the side of a flat bed without using bedrails?: A Little Help needed moving to and from a bed to a chair (including a wheelchair)?: Total Help needed standing up from a chair using your arms (e.g., wheelchair or bedside chair)?:  Total Help needed to walk in hospital room?: Total Help needed climbing 3-5 steps with a railing? : Total 6 Click Score: 10    End of Session Equipment Utilized During Treatment: Gait belt Activity Tolerance: Patient tolerated treatment well Patient left: in chair;with call bell/phone within reach;with chair alarm set Nurse Communication: Mobility status PT Visit Diagnosis: Unsteadiness on feet (R26.81);Other abnormalities of gait and mobility (R26.89);Muscle weakness (generalized) (M62.81)     Time: 8857-8797 PT Time Calculation (min) (ACUTE ONLY): 20 min  Charges:    $Gait Training: 8-22 mins PT General Charges $$ ACUTE PT VISIT: 1 Visit                     Randall SAUNDERS, PT, DPT Acute Rehabilitation Services Office: [phone] Secure Chat Preferred  Delon CHRISTELLA Callander 03/13/2024, 2:35 PM

## 2024-03-13 NOTE — Progress Notes (Signed)
 Speech Language Pathology  Patient Details Name: Calvin Escobar MRN: [id number] DOB: 07-Sep-1950 Today's Date: 03/13/2024 Time: 0902-0918 SLP Time Calculation (min) (ACUTE ONLY): 16 min   ST ordered for swallow assessment and cognitive assessment. See eval for results of swallow eval.  In regards to order for cognitive evaluation, pt has a history of dementia and has had encephalopathy this admission. Agree with GI note that some encephalopathy secondary to his uremia and history of heavy alcohol intake in the past. He has not had an acute neurological change and suspect that cognition should improve to his baseline once medical issues are improved. Cognitive assessment not warranted at this time. If pt's cognition continues to decline once discharged to SNF, SLP at facility can address.  While in hospital SLP will continue to see for swallow, advancement of texture as able and recommend full supervision/assist with meals for safest intake.    Dustin Olam Bull  03/13/2024, 9:59 AM

## 2024-03-13 NOTE — Anesthesia Postprocedure Evaluation (Signed)
 Anesthesia Post Note  Patient: Calvin Escobar  Procedure(s) Performed: CLOSURE, ILEOSTOMY EXAM UNDER ANESTHESIA, RECTUM     Patient location during evaluation: PACU Anesthesia Type: General Level of consciousness: awake and alert Pain management: pain level controlled Vital Signs Assessment: post-procedure vital signs reviewed and stable Respiratory status: spontaneous breathing, nonlabored ventilation and respiratory function stable Cardiovascular status: stable and blood pressure returned to baseline Anesthetic complications: no   No notable events documented.  Last Vitals:  Vitals:   03/13/24 0520 03/13/24 0825  BP: 133/78 123/81  Pulse: (!) 112 (!) 109  Resp: 16 17  Temp:  37 C  SpO2: 96% 99%    Last Pain:  Vitals:   03/13/24 0951  TempSrc:   PainSc: 4                  Debby FORBES Like

## 2024-03-13 NOTE — Progress Notes (Signed)
 Received patient from [facility], A/O x 2. Bil wrist restraints was already d/c'd in [facility]. Pt calm and compliant rt now. Oriented to room/callbell. Will request Tele sitter monitor Brought to the room for pt safety.

## 2024-03-14 DIAGNOSIS — N1831 Chronic kidney disease, stage 3a: Secondary | ICD-10-CM | POA: Diagnosis not present

## 2024-03-14 DIAGNOSIS — N179 Acute kidney failure, unspecified: Secondary | ICD-10-CM | POA: Diagnosis not present

## 2024-03-14 LAB — CBC WITH DIFFERENTIAL/PLATELET
Abs Immature Granulocytes: 0.09 K/uL — ABNORMAL HIGH (ref 0.00–0.07)
Basophils Absolute: 0.2 K/uL — ABNORMAL HIGH (ref 0.0–0.1)
Basophils Relative: 2 %
Eosinophils Absolute: 0.2 K/uL (ref 0.0–0.5)
Eosinophils Relative: 2 %
HCT: 27.7 % — ABNORMAL LOW (ref 39.0–52.0)
Hemoglobin: 8.1 g/dL — ABNORMAL LOW (ref 13.0–17.0)
Immature Granulocytes: 1 %
Lymphocytes Relative: 12 %
Lymphs Abs: 1.3 K/uL (ref 0.7–4.0)
MCH: 26.1 pg (ref 26.0–34.0)
MCHC: 29.2 g/dL — ABNORMAL LOW (ref 30.0–36.0)
MCV: 89.4 fL (ref 80.0–100.0)
Monocytes Absolute: 0.7 K/uL (ref 0.1–1.0)
Monocytes Relative: 6 %
Neutro Abs: 7.9 K/uL — ABNORMAL HIGH (ref 1.7–7.7)
Neutrophils Relative %: 77 %
Platelets: 156 K/uL (ref 150–400)
RBC: 3.1 MIL/uL — ABNORMAL LOW (ref 4.22–5.81)
RDW: 15.5 % (ref 11.5–15.5)
WBC: 10.3 K/uL (ref 4.0–10.5)
nRBC: 0 % (ref 0.0–0.2)

## 2024-03-14 LAB — GLUCOSE, CAPILLARY: Glucose-Capillary: 89 mg/dL (ref 70–99)

## 2024-03-14 LAB — BASIC METABOLIC PANEL WITH GFR
Anion gap: 10 (ref 5–15)
BUN: 17 mg/dL (ref 8–23)
CO2: 21 mmol/L — ABNORMAL LOW (ref 22–32)
Calcium: 8.1 mg/dL — ABNORMAL LOW (ref 8.9–10.3)
Chloride: 108 mmol/L (ref 98–111)
Creatinine, Ser: 1.95 mg/dL — ABNORMAL HIGH (ref 0.61–1.24)
GFR, Estimated: 35 mL/min — ABNORMAL LOW (ref >60)
Glucose, Bld: 115 mg/dL — ABNORMAL HIGH (ref 70–99)
Potassium: 3.9 mmol/L (ref 3.5–5.1)
Sodium: 139 mmol/L (ref 135–145)

## 2024-03-14 NOTE — Progress Notes (Signed)
 Called Chalfant,[name] (Spouse) [phone] -to give update  about the PT mobility, bowel movement, VS, watching TV and eating well during meal time.

## 2024-03-14 NOTE — Plan of Care (Signed)
 Problem: Education: Goal: Knowledge of General Education information will improve Description: Including pain rating scale, medication(s)/side effects and non-pharmacologic comfort measures 03/14/2024 0354 by Wyvonne Dines, RN Outcome: Progressing 03/14/2024 0353 by Wyvonne Dines, RN Outcome: Progressing   Problem: Health Behavior/Discharge Planning: Goal: Ability to manage health-related needs will improve 03/14/2024 0354 by Wyvonne Dines, RN Outcome: Progressing 03/14/2024 0353 by Wyvonne Dines, RN Outcome: Progressing   Problem: Clinical Measurements: Goal: Ability to maintain clinical measurements within normal limits will improve 03/14/2024 0354 by Wyvonne Dines, RN Outcome: Progressing 03/14/2024 0353 by Wyvonne Dines, RN Outcome: Progressing Goal: Will remain free from infection 03/14/2024 0354 by Wyvonne Dines, RN Outcome: Progressing 03/14/2024 0353 by Wyvonne Dines, RN Outcome: Progressing Goal: Diagnostic test results will improve 03/14/2024 0354 by Wyvonne Dines, RN Outcome: Progressing 03/14/2024 0353 by Wyvonne Dines, RN Outcome: Progressing Goal: Respiratory complications will improve 03/14/2024 0354 by Wyvonne Dines, RN Outcome: Progressing 03/14/2024 0353 by Wyvonne Dines, RN Outcome: Progressing Goal: Cardiovascular complication will be avoided 03/14/2024 0354 by Wyvonne Dines, RN Outcome: Progressing 03/14/2024 0353 by Wyvonne Dines, RN Outcome: Progressing   Problem: Activity: Goal: Risk for activity intolerance will decrease 03/14/2024 0354 by Wyvonne Dines, RN Outcome: Progressing 03/14/2024 0353 by Wyvonne Dines, RN Outcome: Progressing   Problem: Nutrition: Goal: Adequate nutrition will be maintained 03/14/2024 0354 by Wyvonne Dines, RN Outcome: Progressing 03/14/2024 0353 by Wyvonne Dines, RN Outcome: Progressing   Problem: Coping: Goal: Level of anxiety will decrease 03/14/2024 0354 by Wyvonne Dines, RN Outcome:  Progressing 03/14/2024 0353 by Wyvonne Dines, RN Outcome: Progressing   Problem: Elimination: Goal: Will not experience complications related to bowel motility 03/14/2024 0354 by Wyvonne Dines, RN Outcome: Progressing 03/14/2024 0353 by Wyvonne Dines, RN Outcome: Progressing Goal: Will not experience complications related to urinary retention 03/14/2024 0354 by Wyvonne Dines, RN Outcome: Progressing 03/14/2024 0353 by Wyvonne Dines, RN Outcome: Progressing   Problem: Pain Managment: Goal: General experience of comfort will improve and/or be controlled 03/14/2024 0354 by Wyvonne Dines, RN Outcome: Progressing 03/14/2024 0353 by Wyvonne Dines, RN Outcome: Progressing   Problem: Safety: Goal: Ability to remain free from injury will improve 03/14/2024 0354 by Wyvonne Dines, RN Outcome: Progressing 03/14/2024 0353 by Wyvonne Dines, RN Outcome: Progressing   Problem: Skin Integrity: Goal: Risk for impaired skin integrity will decrease 03/14/2024 0354 by Wyvonne Dines, RN Outcome: Progressing 03/14/2024 0353 by Wyvonne Dines, RN Outcome: Progressing   Problem: Safety: Goal: Non-violent Restraint(s) 03/14/2024 0354 by Wyvonne Dines, RN Outcome: Progressing 03/14/2024 0353 by Wyvonne Dines, RN Outcome: Progressing   Problem: Education: Goal: Understanding of discharge needs will improve 03/14/2024 0354 by Wyvonne Dines, RN Outcome: Progressing 03/14/2024 0353 by Wyvonne Dines, RN Outcome: Progressing Goal: Verbalization of understanding of the causes of altered bowel function will improve 03/14/2024 0354 by Wyvonne Dines, RN Outcome: Progressing 03/14/2024 0353 by Wyvonne Dines, RN Outcome: Progressing   Problem: Activity: Goal: Ability to tolerate increased activity will improve 03/14/2024 0354 by Wyvonne Dines, RN Outcome: Progressing 03/14/2024 0353 by Wyvonne Dines, RN Outcome: Progressing   Problem: Bowel/Gastric: Goal: Gastrointestinal status for  postoperative course will improve 03/14/2024 0354 by Wyvonne Dines, RN Outcome: Progressing 03/14/2024 0353 by Wyvonne Dines, RN Outcome: Progressing   Problem: Health Behavior/Discharge Planning: Goal: Identification of community resources to assist with postoperative recovery needs will improve 03/14/2024 0354 by Wyvonne Dines, RN Outcome: Progressing 03/14/2024 0353 by Wyvonne Dines, RN Outcome: Progressing   Problem: Nutritional: Goal: Will attain and maintain optimal nutritional status will improve 03/14/2024 0354 by Wyvonne Dines, RN  Outcome: Progressing 03/14/2024 0353 by Wyvonne Dines, RN Outcome: Progressing   Problem: Clinical Measurements: Goal: Postoperative complications will be avoided or minimized 03/14/2024 0354 by Wyvonne Dines, RN Outcome: Progressing 03/14/2024 0353 by Wyvonne Dines, RN Outcome: Progressing   Problem: Respiratory: Goal: Respiratory status will improve 03/14/2024 0354 by Wyvonne Dines, RN Outcome: Progressing 03/14/2024 0353 by Wyvonne Dines, RN Outcome: Progressing   Problem: Skin Integrity: Goal: Will show signs of wound healing 03/14/2024 0354 by Wyvonne Dines, RN Outcome: Progressing 03/14/2024 0353 by Wyvonne Dines, RN Outcome: Progressing

## 2024-03-14 NOTE — Progress Notes (Signed)
 2 Days Post-Op  Subjective: Laying in bed with no complaints.  No nausea, but appears to really not be eating much.  Calorie count in process.  Denies pain.  Moves his bowels 2 days ago  Objective: Vital signs in last 24 hours: Temp:  [98.2 F (36.8 C)-98.4 F (36.9 C)] 98.4 F (36.9 C) (10/11 0549) Pulse Rate:  [49-106] 98 (10/11 0549) Resp:  [16-18] 18 (10/11 0549) BP: (117-129)/(74-96) 121/87 (10/11 0549) SpO2:  [97 %-100 %] 98 % (10/11 0549) Weight:  [91 kg] 91 kg (10/11 0500) Last BM Date : 03/12/24  Intake/Output from previous day: 10/10 0701 - 10/11 0700 In: 480 [P.O.:480] Out: 350 [Urine:350] Intake/Output this shift: No intake/output data recorded.  PE: Abd: soft, minimally tender, RLQ incision is c/d/I with no erythema or drainage  Lab Results:  Recent Labs    03/13/24 0302 03/14/24 0757  WBC 12.2* 10.3  HGB 8.7* 8.1*  HCT 28.8* 27.7*  PLT 185 156   BMET Recent Labs    03/12/24 0240 03/13/24 0302  NA 140 136  K 4.4 4.2  CL 104 106  CO2 23 21*  GLUCOSE 113* 123*  BUN 18 17  CREATININE 2.16* 2.01*  CALCIUM  8.4* 8.0*   PT/INR No results for input(s): LABPROT, INR in the last 72 hours. CMP     Component Value Date/Time   NA 136 03/13/2024 0302   NA 143 04/14/2021 1125   K 4.2 03/13/2024 0302   CL 106 03/13/2024 0302   CO2 21 (L) 03/13/2024 0302   GLUCOSE 123 (H) 03/13/2024 0302   BUN 17 03/13/2024 0302   BUN 21 04/14/2021 1125   CREATININE 2.01 (H) 03/13/2024 0302   CREATININE 1.52 (H) 10/25/2023 1555   CALCIUM  8.0 (L) 03/13/2024 0302   PROT 6.4 (L) 03/12/2024 0240   PROT 7.0 10/28/2019 1629   ALBUMIN  2.6 (L) 03/12/2024 0240   ALBUMIN  4.2 10/28/2019 1629   AST 18 03/12/2024 0240   ALT 8 03/12/2024 0240   ALKPHOS 59 03/12/2024 0240   BILITOT 0.4 03/12/2024 0240   BILITOT 0.4 10/28/2019 1629   GFRNONAA 34 (L) 03/13/2024 0302   GFRAA 67 11/13/2019 0000   Lipase     Component Value Date/Time   LIPASE 15.0 01/15/2023 1243        Studies/Results: No results found.  Anti-infectives: Anti-infectives (From admission, onward)    Start     Dose/Rate Route Frequency Ordered Stop   03/12/24 2200  cefoTEtan  (CEFOTAN ) 2 g in sodium chloride  0.9 % 100 mL IVPB  Status:  Discontinued        2 g 200 mL/hr over 30 Minutes Intravenous Every 12 hours 03/12/24 1957 03/12/24 2043   03/12/24 0600  cefoTEtan  (CEFOTAN ) 2 g in sodium chloride  0.9 % 100 mL IVPB        2 g 200 mL/hr over 30 Minutes Intravenous On call to O.R. 03/11/24 1206 03/12/24 1550   03/10/24 1315  ceFAZolin (ANCEF) IVPB 2g/100 mL premix        2 g 200 mL/hr over 30 Minutes Intravenous Every 12 hours 03/10/24 1221 03/14/24 1759   03/07/24 1800  cefTRIAXone  (ROCEPHIN ) 2 g in sodium chloride  0.9 % 100 mL IVPB  Status:  Discontinued        2 g 200 mL/hr over 30 Minutes Intravenous Every 24 hours 03/07/24 0842 03/10/24 1221   03/07/24 0930  azithromycin (ZITHROMAX) tablet 500 mg  Status:  Discontinued  500 mg Oral Daily 03/07/24 0842 03/09/24 1046   03/06/24 1800  cefTRIAXone  (ROCEPHIN ) 1 g in sodium chloride  0.9 % 100 mL IVPB  Status:  Discontinued        1 g 200 mL/hr over 30 Minutes Intravenous Every 24 hours 03/06/24 1703 03/07/24 0842        Assessment/Plan POD 2, s/p takedown of diverting loop ileostomy, Dr. Sheldon -on a D1 diet.  Not really eating.  Can advance to D3 as he tolerates -calorie count in process -no nausea, no BM yet.  On entereg  -WBC normal today, AF -mobilize as able with therapies, PT/OT/SLP consults in place -will continue to monitor    FEN - D1 diet VTE - heparin  ID - none   LOS: 8 days    Burnard FORBES Banter , [facility] 03/14/2024, 8:34 AM Please see Amion for pager number during day hours 7:00am-4:30pm or 7:00am -11:30am on weekends

## 2024-03-14 NOTE — TOC Progression Note (Addendum)
 Transition of Care [facility]) - Progression Note    Patient Details  Name: Calvin Escobar MRN: [id number] Date of Birth: 22-May-1951  Transition of [facility] CM/SW Contact  Gwenn Frieze Shaw, [place] Phone Number: 03/14/2024, 10:37 AM  Clinical Narrative: Per Shona with [facility], Mylene barrows is actually valid through 11:59pm today and they are able to accept if pt is medically stable for dc. Notified MD via secure chat.   1055: Per MD, pt not ready for dc today. EDD is for Monday. Will need to resubmit auth request Sunday.   Frieze Gwenn, MSW, LCSW [phone] (coverage)      Expected Discharge Plan: Skilled Nursing Facility Barriers to Discharge: Awaiting State Approval THEONE)               Expected Discharge Plan and Services In-house Referral: Clinical Social Work   Post Acute Care Choice: Skilled Nursing Facility Living arrangements for the past 2 months: Single Family Home, Skilled Nursing Facility                                       Social Drivers of Health (SDOH) Interventions SDOH Screenings   Food Insecurity: Patient Unable To Answer (03/07/2024)  Housing: Unknown (03/07/2024)  Transportation Needs: Patient Unable To Answer (03/07/2024)  Utilities: Not At Risk (03/07/2024)  Alcohol Screen: Low Risk  (10/10/2023)  Depression (PHQ2-9): Low Risk  (12/09/2023)  Recent Concern: Depression (PHQ2-9) - Medium Risk (09/16/2023)  Financial Resource Strain: Low Risk  (10/10/2023)  Physical Activity: Sufficiently Active (10/10/2023)  Social Connections: Moderately Integrated (03/07/2024)  Recent Concern: Social Connections - Socially Isolated (12/11/2023)  Stress: No Stress Concern Present (10/10/2023)  Tobacco Use: Low Risk  (03/12/2024)  Health Literacy: Adequate Health Literacy (10/10/2023)    Readmission Risk Interventions    03/09/2024    3:43 PM 01/14/2024    2:09 PM 07/04/2023    1:27 PM  Readmission Risk Prevention Plan  Transportation Screening  Complete  Complete  PCP or Specialist Appt within 5-7 Days   Complete  Home Care Screening   Complete  Medication Review (RN CM)   Complete  Medication Review (RN Care Manager) Complete Complete   PCP or Specialist appointment within 3-5 days of discharge Complete Complete   HRI or Home Care Consult Complete Complete   SW Recovery Care/Counseling Consult Complete Complete   Palliative Care Screening Not Applicable Not Applicable   Skilled Nursing Facility Complete Complete

## 2024-03-14 NOTE — Progress Notes (Signed)
 PROGRESS NOTE    Calvin Escobar  FMW:[id number] DOB: 1951-03-20 DOA: 03/06/2024 PCP: [name], MD   Brief Narrative:  This 73 yrs old Male with PMH significant for CKD stage III A, anemia of chronic disease, hypertension, recurrent UTIs, chronic HFrEF, chronic indwelling Foley catheter, bipolar disorder, dementia, hyperlipidemia, OSA on CPAP, Morbid obesity, history of urinary retention, ileostomy in place, protein calorie malnutrition , hospitalization for colovesical fistula /sigmoid diverticulitis for which patient underwent robotic rectosigmoid resection with anastomosis, colovesicle fistula takedown and repair, diverting ileostomy and drainage of pelvic abscess and lysis of adhesions in 8/25.[name] He was re admitted at beginning of September this year for sepsis secondary to UTI/gram negative bacteremia and AKI on CKD stage 3a. He presents this admission from his SNF for AMS x 2 days. Found again to be in AKI with metabolic encephalopathy.  He was admitted for further evaluation.  Assessment & Plan:   Principal Problem:   Acute kidney injury superimposed on stage 3a chronic kidney disease (HCC) with hyperkalemia Active Problems:   Acute encephalopathy   Anemia of chronic disease   Hypertension   Recurrent UTI   Chronic kidney disease, stage 3a (HCC)   Diverticulitis of sigmoid colon s/p robotic colectomy 01/09/2024   Chronic HFrEF (heart failure with reduced ejection fraction) (HCC)   Chronic indwelling Foley catheter   Bipolar affective disorder, depressed (HCC)   Dementia (HCC)   GERD (gastroesophageal reflux disease)   HLD (hyperlipidemia)   OSA on CPAP   Hyperkalemia   Morbid obesity (HCC)   Hydronephrosis, right   History of urinary retention   Ileostomy in place [facility])   Malnutrition of moderate degree   Chronic hyponatremia   Protein-calorie malnutrition, severe  Acute kidney injury on CKD stage IIIA: Likely secondary to large volume losses with high output  ostomy. Reversal of ostomy completed  on 03/12/24. Creatinine continues to improve, IV fluid discontinued. Serum creatinine 2.01 today, appears close to baseline.  Hypertension /sinus tachycardia: This seems to be intermittent, he has Foley catheter in place,  he is not confused. Blood pressure is well-controlled, Heart rate  still remains elevated. This could be secondary to pain, continue Coreg  twice daily  Sepsis secondary to Klebsiella UTI: Urine culture confirms 100 K Klebsiella. Continue Ancef 2 g every 12 hours. Can change antibiotics to oral at discharge  Acute Encephalopathy secondary to uremia and polypharmacy: Patient remains back to his baseline mental status as renal functions are improving. Continued nonviolent restraints with safety observation. Now discontinued. Ostomy reversal has been completed. Continue Seroquel 25 mg daily and 50 mg at bedtime for agitation. Continue BuSpar  10 mg TID daily.  Chronic HFr EF: Continue Coreg .  He appears euvolemic.  Chronic indwelling Foley catheter: Patient has history of BPH and urethral stricture s/p dilatation. Urology has evaluated the patient,  they recommended outpatient management options and have signed off.  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 Patient underwent closure ileostomy 10/9. Recommended dysphagia 1 diet. Surgery following. PT/ OT evaluation   DVT prophylaxis: Heparin  Code Status: Full code Family Communication: No family at bedside. Disposition Plan:    Status is: Inpatient Remains inpatient appropriate because: Severity of illness   Consultants:  General Surgery Nephrology   Procedures: Ostomy reversal  Antimicrobials:  Anti-infectives (From admission, onward)    Start     Dose/Rate Route Frequency Ordered Stop   03/12/24 2200  cefoTEtan  (CEFOTAN ) 2 g in sodium chloride  0.9 % 100  mL IVPB  Status:  Discontinued        2 g 200 mL/hr over  30 Minutes Intravenous Every 12 hours 03/12/24 1957 03/12/24 2043   03/12/24 0600  cefoTEtan  (CEFOTAN ) 2 g in sodium chloride  0.9 % 100 mL IVPB        2 g 200 mL/hr over 30 Minutes Intravenous On call to O.R. 03/11/24 1206 03/12/24 1550   03/10/24 1315  ceFAZolin (ANCEF) IVPB 2g/100 mL premix        2 g 200 mL/hr over 30 Minutes Intravenous Every 12 hours 03/10/24 1221 03/14/24 1759   03/07/24 1800  cefTRIAXone  (ROCEPHIN ) 2 g in sodium chloride  0.9 % 100 mL IVPB  Status:  Discontinued        2 g 200 mL/hr over 30 Minutes Intravenous Every 24 hours 03/07/24 0842 03/10/24 1221   03/07/24 0930  azithromycin (ZITHROMAX) tablet 500 mg  Status:  Discontinued        500 mg Oral Daily 03/07/24 0842 03/09/24 1046   03/06/24 1800  cefTRIAXone  (ROCEPHIN ) 1 g in sodium chloride  0.9 % 100 mL IVPB  Status:  Discontinued        1 g 200 mL/hr over 30 Minutes Intravenous Every 24 hours 03/06/24 1703 03/07/24 0842       Subjective: Patient was seen and examined at bedside. Overnight events noted. Ostomy has been reversed 03/12/24.  Patient has dressing noted on the right side of the abdomen.   He appears alert,  seems back to his baseline mental status.  Objective: Vitals:   03/14/24 0243 03/14/24 0500 03/14/24 0549 03/14/24 0923  BP: (!) 117/96  121/87 133/75  Pulse: (!) 49  98 (!) 107  Resp: 16  18 17   Temp: 98.4 F (36.9 C)  98.4 F (36.9 C) 97.7 F (36.5 C)  TempSrc: Oral   Oral  SpO2: 100%  98% 100%  Weight:  91 kg    Height:        Intake/Output Summary (Last 24 hours) at 03/14/2024 1116 Last data filed at 03/14/2024 1045 Gross per 24 hour  Intake 723 ml  Output --  Net 723 ml   Filed Weights   03/12/24 0500 03/13/24 0500 03/14/24 0500  Weight: 90.2 kg 91.3 kg 91 kg    Examination:  General exam: Appears calm and comfortable, not in any acute distress. Respiratory system: CTA Bilaterally. Respiratory effort normal. RR 18 Cardiovascular system: S1 & S2 heard, RRR. No JVD,  murmurs, rubs, gallops or clicks.  Gastrointestinal system: Abdomen is non distended, soft and Mildly tender.  Dressing noted, Normal bowel sounds heard. Central nervous system: Alert and oriented x 3. No focal neurological deficits. Extremities: No edema, no cyanosis, no clubbing. Skin: No rashes, lesions or ulcers Psychiatry: Judgement and insight appear normal. Mood & affect appropriate.     Data Reviewed: I have personally reviewed following labs and imaging studies  CBC: Recent Labs  Lab 03/10/24 0403 03/11/24 0259 03/12/24 0240 03/13/24 0302 03/14/24 0757  WBC 10.2 9.2 9.3 12.2* 10.3  NEUTROABS 6.7 5.9 5.8 9.2* 7.9*  HGB 8.6* 8.6* 9.4* 8.7* 8.1*  HCT 27.7* 28.1* 30.6* 28.8* 27.7*  MCV 84.7 87.0 87.2 88.3 89.4  PLT 275 245 228 185 156   Basic Metabolic Panel: Recent Labs  Lab 03/09/24 0333 03/10/24 0403 03/11/24 0259 03/12/24 0240 03/13/24 0302 03/14/24 0757  NA 133* 138 139 140 136 139  K 3.9 4.2 4.4 4.4 4.2 3.9  CL 100 104 102 104 106  108  CO2 23 25 24 23  21* 21*  GLUCOSE 109* 115* 107* 113* 123* 115*  BUN 42* 31* 25* 18 17 17   CREATININE 2.98* 2.61* 2.40* 2.16* 2.01* 1.95*  CALCIUM  7.7* 8.5* 8.5* 8.4* 8.0* 8.1*  MG 1.6* 2.3 2.1 1.8 1.6*  --   PHOS  --  3.3  --   --  4.3  --    GFR: Estimated Creatinine Clearance: 35.9 mL/min (A) (by C-G formula based on SCr of 1.95 mg/dL (H)). Liver Function Tests: Recent Labs  Lab 03/08/24 0133 03/09/24 0333 03/10/24 0403 03/11/24 0259 03/12/24 0240  AST <10* 11* 19 18 18   ALT 8 9 9 9 8   ALKPHOS 53 54 56 58 59  BILITOT 0.5 0.5 0.5 0.5 0.4  PROT 5.4* 5.5* 6.5 6.1* 6.4*  ALBUMIN  1.9* 2.0* 2.7* 2.5* 2.6*   No results for input(s): LIPASE, AMYLASE in the last 168 hours. Recent Labs  Lab 03/08/24 0133 03/09/24 0333  AMMONIA 16 21   Coagulation Profile: No results for input(s): INR, PROTIME in the last 168 hours. Cardiac Enzymes: No results for input(s): CKTOTAL, CKMB, CKMBINDEX, TROPONINI in  the last 168 hours. BNP (last 3 results) No results for input(s): PROBNP in the last 8760 hours. HbA1C: No results for input(s): HGBA1C in the last 72 hours. CBG: Recent Labs  Lab 03/14/24 0605  GLUCAP 89   Lipid Profile: No results for input(s): CHOL, HDL, LDLCALC, TRIG, CHOLHDL, LDLDIRECT in the last 72 hours. Thyroid  Function Tests: No results for input(s): TSH, T4TOTAL, FREET4, T3FREE, THYROIDAB in the last 72 hours. Anemia Panel: No results for input(s): VITAMINB12, FOLATE, FERRITIN, TIBC, IRON , RETICCTPCT in the last 72 hours. Sepsis Labs: Recent Labs  Lab 03/08/24 0133 03/09/24 0333  PROCALCITON 1.59 0.72    Recent Results (from the past 240 hours)  Culture, blood (Routine X 2) w Reflex to ID Panel     Status: Abnormal   Collection Time: 03/06/24  1:26 PM   Specimen: Right Antecubital; Blood  Result Value Ref Range Status   Specimen Description   Final    RIGHT ANTECUBITAL Performed at [facility], [address], [place], [place] [address]    Special Requests   Final    BOTTLES DRAWN AEROBIC AND ANAEROBIC Blood Culture adequate volume Performed at [facility], [address], [place], [place] [address]    Culture  Setup Time   Final    GRAM NEGATIVE RODS IN BOTH AEROBIC AND ANAEROBIC BOTTLES CRITICAL RESULT CALLED TO, READ BACK BY AND VERIFIED WITH: PHARMD G ABBOTT 03/08/2024 @ 0305 BY AB Performed at [facility], [address], [place], [place] [address]    Culture KLEBSIELLA PNEUMONIAE (A)  Final   Report Status 03/10/2024 FINAL  Final   Organism ID, Bacteria KLEBSIELLA PNEUMONIAE  Final      Susceptibility   Klebsiella pneumoniae - MIC*    AMPICILLIN  RESISTANT Resistant     CEFAZOLIN (NON-URINE) 2 SENSITIVE Sensitive     CEFEPIME  <=0.12 SENSITIVE Sensitive     ERTAPENEM <=0.12 SENSITIVE Sensitive     CEFTRIAXONE  <=0.25 SENSITIVE Sensitive     CIPROFLOXACIN <=0.06 SENSITIVE  Sensitive     GENTAMICIN <=1 SENSITIVE Sensitive     MEROPENEM <=0.25 SENSITIVE Sensitive     TRIMETH/SULFA <=20 SENSITIVE Sensitive     AMPICILLIN /SULBACTAM 4 SENSITIVE Sensitive     PIP/TAZO Value in next row Sensitive      <=4 SENSITIVEThis is a modified FDA-approved  test that has been validated and its performance characteristics determined by the reporting laboratory.  This laboratory is certified under the Clinical Laboratory Improvement Amendments CLIA as qualified to perform high complexity clinical laboratory testing.    * KLEBSIELLA PNEUMONIAE  Blood Culture ID Panel (Reflexed)     Status: Abnormal   Collection Time: 03/06/24  1:26 PM  Result Value Ref Range Status   Enterococcus faecalis NOT DETECTED NOT DETECTED Final   Enterococcus Faecium NOT DETECTED NOT DETECTED Final   Listeria monocytogenes NOT DETECTED NOT DETECTED Final   Staphylococcus species NOT DETECTED NOT DETECTED Final   Staphylococcus aureus (BCID) NOT DETECTED NOT DETECTED Final   Staphylococcus epidermidis NOT DETECTED NOT DETECTED Final   Staphylococcus lugdunensis NOT DETECTED NOT DETECTED Final   Streptococcus species NOT DETECTED NOT DETECTED Final   Streptococcus agalactiae NOT DETECTED NOT DETECTED Final   Streptococcus pneumoniae NOT DETECTED NOT DETECTED Final   Streptococcus pyogenes NOT DETECTED NOT DETECTED Final   A.calcoaceticus-baumannii NOT DETECTED NOT DETECTED Final   Bacteroides fragilis NOT DETECTED NOT DETECTED Final   Enterobacterales DETECTED (A) NOT DETECTED Final    Comment: Enterobacterales represent a large order of gram negative bacteria, not a single organism. CRITICAL RESULT CALLED TO, READ BACK BY AND VERIFIED WITH: PHARMD G ABBOTT 03/08/2024 @ 0305 BY AB    Enterobacter cloacae complex NOT DETECTED NOT DETECTED Final   Escherichia coli NOT DETECTED NOT DETECTED Final   Klebsiella aerogenes NOT DETECTED NOT DETECTED Final   Klebsiella oxytoca NOT DETECTED NOT DETECTED Final    Klebsiella pneumoniae DETECTED (A) NOT DETECTED Final    Comment: CRITICAL RESULT CALLED TO, READ BACK BY AND VERIFIED WITH: PHARMD G ABBOTT 03/08/2024 @ 0305 BY AB    Proteus species NOT DETECTED NOT DETECTED Final   Salmonella species NOT DETECTED NOT DETECTED Final   Serratia marcescens NOT DETECTED NOT DETECTED Final   Haemophilus influenzae NOT DETECTED NOT DETECTED Final   Neisseria meningitidis NOT DETECTED NOT DETECTED Final   Pseudomonas aeruginosa NOT DETECTED NOT DETECTED Final   Stenotrophomonas maltophilia NOT DETECTED NOT DETECTED Final   Candida albicans NOT DETECTED NOT DETECTED Final   Candida auris NOT DETECTED NOT DETECTED Final   Candida glabrata NOT DETECTED NOT DETECTED Final   Candida krusei NOT DETECTED NOT DETECTED Final   Candida parapsilosis NOT DETECTED NOT DETECTED Final   Candida tropicalis NOT DETECTED NOT DETECTED Final   Cryptococcus neoformans/gattii NOT DETECTED NOT DETECTED Final   CTX-M ESBL NOT DETECTED NOT DETECTED Final   Carbapenem resistance IMP NOT DETECTED NOT DETECTED Final   Carbapenem resistance KPC NOT DETECTED NOT DETECTED Final   Carbapenem resistance NDM NOT DETECTED NOT DETECTED Final   Carbapenem resist OXA 48 LIKE NOT DETECTED NOT DETECTED Final   Carbapenem resistance VIM NOT DETECTED NOT DETECTED Final    Comment: Performed at [facility], [address], [place], [place] [address]  Culture, blood (Routine X 2) w Reflex to ID Panel     Status: None   Collection Time: 03/06/24  1:31 PM   Specimen: BLOOD LEFT FOREARM  Result Value Ref Range Status   Specimen Description   Final    BLOOD LEFT FOREARM Performed at [facility], [address] [place], [place] [address]    Special Requests   Final    BOTTLES DRAWN AEROBIC AND ANAEROBIC Blood Culture adequate volume Performed at [facility], [address], [place], [place] [address]  Culture   Final    NO GROWTH 5 DAYS Performed at [facility], [address] [place], [place] [address]    Report Status 03/11/2024 FINAL  Final  Urine Culture     Status: Abnormal   Collection Time: 03/06/24  3:13 PM   Specimen: Urine, Random  Result Value Ref Range Status   Specimen Description   Final    URINE, RANDOM Performed at [facility], [address] [place], [place] [address]    Special Requests   Final    NONE Reflexed from [id number] Performed at [facility], [address], [place], [place] [address]    Culture >=100,000 COLONIES/mL KLEBSIELLA PNEUMONIAE (A)  Final   Report Status 03/08/2024 FINAL  Final   Organism ID, Bacteria KLEBSIELLA PNEUMONIAE (A)  Final      Susceptibility   Klebsiella pneumoniae - MIC*    AMPICILLIN  RESISTANT Resistant     CEFAZOLIN (URINE) Value in next row Sensitive      2 SENSITIVEThis is a modified FDA-approved test that has been validated and its performance characteristics determined by the reporting laboratory.  This laboratory is certified under the Clinical Laboratory Improvement Amendments CLIA as qualified to perform high complexity clinical laboratory testing.    CEFEPIME  Value in next row Sensitive      2 SENSITIVEThis is a modified FDA-approved test that has been validated and its performance characteristics determined by the reporting laboratory.  This laboratory is certified under the Clinical Laboratory Improvement Amendments CLIA as qualified to perform high complexity clinical laboratory testing.    ERTAPENEM Value in next row Sensitive      2 SENSITIVEThis is a modified FDA-approved test that has been validated and its performance characteristics determined by the reporting laboratory.  This laboratory is certified under the Clinical Laboratory Improvement Amendments CLIA as qualified to perform high complexity clinical laboratory testing.    CEFTRIAXONE  Value in next row Sensitive      2 SENSITIVEThis is a modified FDA-approved test that  has been validated and its performance characteristics determined by the reporting laboratory.  This laboratory is certified under the Clinical Laboratory Improvement Amendments CLIA as qualified to perform high complexity clinical laboratory testing.    CIPROFLOXACIN Value in next row Sensitive      2 SENSITIVEThis is a modified FDA-approved test that has been validated and its performance characteristics determined by the reporting laboratory.  This laboratory is certified under the Clinical Laboratory Improvement Amendments CLIA as qualified to perform high complexity clinical laboratory testing.    GENTAMICIN Value in next row Sensitive      2 SENSITIVEThis is a modified FDA-approved test that has been validated and its performance characteristics determined by the reporting laboratory.  This laboratory is certified under the Clinical Laboratory Improvement Amendments CLIA as qualified to perform high complexity clinical laboratory testing.    NITROFURANTOIN Value in next row Intermediate      2 SENSITIVEThis is a modified FDA-approved test that has been validated and its performance characteristics determined by the reporting laboratory.  This laboratory is certified under the Clinical Laboratory Improvement Amendments CLIA as qualified to perform high complexity clinical laboratory testing.    TRIMETH/SULFA Value in next row Sensitive      2 SENSITIVEThis is a modified FDA-approved test that has been validated and its performance characteristics determined by the reporting laboratory.  This laboratory is certified under the Clinical Laboratory Improvement Amendments CLIA as qualified to perform  high complexity clinical laboratory testing.    AMPICILLIN /SULBACTAM Value in next row Sensitive      2 SENSITIVEThis is a modified FDA-approved test that has been validated and its performance characteristics determined by the reporting laboratory.  This laboratory is certified under the Clinical Laboratory  Improvement Amendments CLIA as qualified to perform high complexity clinical laboratory testing.    PIP/TAZO Value in next row Sensitive      <=4 SENSITIVEThis is a modified FDA-approved test that has been validated and its performance characteristics determined by the reporting laboratory.  This laboratory is certified under the Clinical Laboratory Improvement Amendments CLIA as qualified to perform high complexity clinical laboratory testing.    MEROPENEM Value in next row Sensitive      <=4 SENSITIVEThis is a modified FDA-approved test that has been validated and its performance characteristics determined by the reporting laboratory.  This laboratory is certified under the Clinical Laboratory Improvement Amendments CLIA as qualified to perform high complexity clinical laboratory testing.    * >=100,000 COLONIES/mL KLEBSIELLA PNEUMONIAE    Radiology Studies: No results found.  Scheduled Meds:  acetaminophen   500 mg Oral TID WC & HS   alvimopan   12 mg Oral BID   bisacodyl  10 mg Rectal Daily   busPIRone   10 mg Oral TID   carvedilol   3.125 mg Oral BID WC   Chlorhexidine  Gluconate Cloth  6 each Topical Daily   Chlorhexidine  Gluconate Cloth  6 each Topical Once   feeding supplement  237 mL Oral TID BM   finasteride   5 mg Oral Daily   folic acid   1 mg Oral Daily   heparin   5,000 Units Subcutaneous Q8H   melatonin  5 mg Oral QHS   multivitamin with minerals  1 tablet Oral Daily   pantoprazole   40 mg Oral Daily   polycarbophil  625 mg Oral BID   QUEtiapine  25 mg Oral Q0600   QUEtiapine  50 mg Oral QHS   sodium chloride  flush  3 mL Intravenous Q12H   tamsulosin   0.4 mg Oral Daily   Continuous Infusions:  sodium chloride       ceFAZolin (ANCEF) IV 2 g (03/14/24 0616)   lactated ringers      lactated ringers        LOS: 8 days    Time spent: 35 mins    Darcel Dawley, MD [facility]   If 7PM-7AM, please contact night-coverage

## 2024-03-14 NOTE — Plan of Care (Signed)
  Problem: Education: Goal: Knowledge of General Education information will improve Description: Including pain rating scale, medication(s)/side effects and non-pharmacologic comfort measures Outcome: Progressing   Problem: Health Behavior/Discharge Planning: Goal: Ability to manage health-related needs will improve Outcome: Progressing   Problem: Clinical Measurements: Goal: Will remain free from infection Outcome: Progressing Goal: Diagnostic test results will improve Outcome: Progressing   Problem: Nutrition: Goal: Adequate nutrition will be maintained Outcome: Progressing   Problem: Coping: Goal: Level of anxiety will decrease Outcome: Progressing   Problem: Elimination: Goal: Will not experience complications related to urinary retention Outcome: Progressing

## 2024-03-15 DIAGNOSIS — N1831 Chronic kidney disease, stage 3a: Secondary | ICD-10-CM | POA: Diagnosis not present

## 2024-03-15 DIAGNOSIS — N179 Acute kidney failure, unspecified: Secondary | ICD-10-CM | POA: Diagnosis not present

## 2024-03-15 LAB — CBC WITH DIFFERENTIAL/PLATELET
Abs Immature Granulocytes: 0.09 K/uL — ABNORMAL HIGH (ref 0.00–0.07)
Basophils Absolute: 0.2 K/uL — ABNORMAL HIGH (ref 0.0–0.1)
Basophils Relative: 2 %
Eosinophils Absolute: 0.2 K/uL (ref 0.0–0.5)
Eosinophils Relative: 2 %
HCT: 24.8 % — ABNORMAL LOW (ref 39.0–52.0)
Hemoglobin: 7.2 g/dL — ABNORMAL LOW (ref 13.0–17.0)
Immature Granulocytes: 1 %
Lymphocytes Relative: 17 %
Lymphs Abs: 1.5 K/uL (ref 0.7–4.0)
MCH: 25.9 pg — ABNORMAL LOW (ref 26.0–34.0)
MCHC: 29 g/dL — ABNORMAL LOW (ref 30.0–36.0)
MCV: 89.2 fL (ref 80.0–100.0)
Monocytes Absolute: 0.6 K/uL (ref 0.1–1.0)
Monocytes Relative: 6 %
Neutro Abs: 6.2 K/uL (ref 1.7–7.7)
Neutrophils Relative %: 72 %
Platelets: 158 K/uL (ref 150–400)
RBC: 2.78 MIL/uL — ABNORMAL LOW (ref 4.22–5.81)
RDW: 15.5 % (ref 11.5–15.5)
WBC: 8.6 K/uL (ref 4.0–10.5)
nRBC: 0 % (ref 0.0–0.2)

## 2024-03-15 LAB — BASIC METABOLIC PANEL WITH GFR
Anion gap: 17 — ABNORMAL HIGH (ref 5–15)
BUN: 21 mg/dL (ref 8–23)
CO2: 18 mmol/L — ABNORMAL LOW (ref 22–32)
Calcium: 8 mg/dL — ABNORMAL LOW (ref 8.9–10.3)
Chloride: 108 mmol/L (ref 98–111)
Creatinine, Ser: 1.82 mg/dL — ABNORMAL HIGH (ref 0.61–1.24)
GFR, Estimated: 38 mL/min — ABNORMAL LOW (ref >60)
Glucose, Bld: 100 mg/dL — ABNORMAL HIGH (ref 70–99)
Potassium: 3.9 mmol/L (ref 3.5–5.1)
Sodium: 143 mmol/L (ref 135–145)

## 2024-03-15 LAB — GLUCOSE, CAPILLARY: Glucose-Capillary: 97 mg/dL (ref 70–99)

## 2024-03-15 NOTE — Progress Notes (Signed)
 PROGRESS NOTE    Calvin Escobar  FMW:[id number] DOB: Dec 04, 1950 DOA: 03/06/2024 PCP: [name], MD   Brief Narrative:  This 73 yrs old Male with PMH significant for CKD stage III A, anemia of chronic disease, hypertension, recurrent UTIs, chronic HFrEF, chronic indwelling Foley catheter, bipolar disorder, dementia, hyperlipidemia, OSA on CPAP, Morbid obesity, history of urinary retention, ileostomy in place, protein calorie malnutrition , hospitalization for colovesical fistula /sigmoid diverticulitis for which patient underwent robotic rectosigmoid resection with anastomosis, colovesicle fistula takedown and repair, diverting ileostomy and drainage of pelvic abscess and lysis of adhesions in 8/25.[name] He was re admitted at beginning of September this year for sepsis secondary to UTI/gram negative bacteremia and AKI on CKD stage 3a. He presents this admission from his SNF for AMS x 2 days. Found again to be in AKI with metabolic encephalopathy.  He was admitted for further evaluation.  Assessment & Plan:   Principal Problem:   Acute kidney injury superimposed on stage 3a chronic kidney disease (HCC) with hyperkalemia Active Problems:   Acute encephalopathy   Anemia of chronic disease   Hypertension   Recurrent UTI   Chronic kidney disease, stage 3a (HCC)   Diverticulitis of sigmoid colon s/p robotic colectomy 01/09/2024   Chronic HFrEF (heart failure with reduced ejection fraction) (HCC)   Chronic indwelling Foley catheter   Bipolar affective disorder, depressed (HCC)   Dementia (HCC)   GERD (gastroesophageal reflux disease)   HLD (hyperlipidemia)   OSA on CPAP   Hyperkalemia   Morbid obesity (HCC)   Hydronephrosis, right   History of urinary retention   Ileostomy in place [facility])   Malnutrition of moderate degree   Chronic hyponatremia   Protein-calorie malnutrition, severe  Acute kidney injury on CKD stage IIIA: Likely secondary to large volume losses with high output  ostomy. Reversal of ostomy completed  on 03/12/24. Creatinine continues to improve, IV fluid discontinued. Serum creatinine 1.82  today, appears close to baseline.  Hypertension /sinus tachycardia: This seems to be intermittent, he has Foley catheter in place,  he is not confused. Blood pressure is well-controlled, Heart rate  still remains elevated. This could be secondary to pain, continue Coreg  twice daily  Sepsis secondary to Klebsiella UTI: Urine culture confirms 100 K Klebsiella. Continue Ancef 2 g every 12 hours. Can change antibiotics to oral at discharge.  Acute Encephalopathy secondary to uremia and polypharmacy: Patient remains back to his baseline mental status as renal functions are improving. Continued nonviolent restraints with safety observation. Now discontinued. Ostomy reversal has been completed. Continue Seroquel 25 mg daily and 50 mg at bedtime for agitation. Continue BuSpar  10 mg TID daily.  Chronic HFr EF: Continue Coreg .  He appears euvolemic.  Chronic indwelling Foley catheter: Patient has history of BPH and urethral stricture s/p dilatation. Urology has evaluated the patient,  they recommended outpatient management options and have signed off.  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 Patient underwent closure ileostomy 10/9. Recommended dysphagia 1 diet. Surgery following. PT/ OT evaluation   DVT prophylaxis: Heparin  Code Status: Full code Family Communication: No family at bedside. Disposition Plan:    Status is: Inpatient Remains inpatient appropriate because: Severity of illness   Consultants:  General Surgery Nephrology   Procedures: Ostomy reversal  Antimicrobials:  Anti-infectives (From admission, onward)    Start     Dose/Rate Route Frequency Ordered Stop   03/12/24 2200  cefoTEtan  (CEFOTAN ) 2 g in sodium chloride  0.9 %  100 mL IVPB  Status:  Discontinued        2 g 200 mL/hr  over 30 Minutes Intravenous Every 12 hours 03/12/24 1957 03/12/24 2043   03/12/24 0600  cefoTEtan  (CEFOTAN ) 2 g in sodium chloride  0.9 % 100 mL IVPB        2 g 200 mL/hr over 30 Minutes Intravenous On call to O.R. 03/11/24 1206 03/12/24 1550   03/10/24 1315  ceFAZolin (ANCEF) IVPB 2g/100 mL premix        2 g 200 mL/hr over 30 Minutes Intravenous Every 12 hours 03/10/24 1221 03/14/24 1759   03/07/24 1800  cefTRIAXone  (ROCEPHIN ) 2 g in sodium chloride  0.9 % 100 mL IVPB  Status:  Discontinued        2 g 200 mL/hr over 30 Minutes Intravenous Every 24 hours 03/07/24 0842 03/10/24 1221   03/07/24 0930  azithromycin (ZITHROMAX) tablet 500 mg  Status:  Discontinued        500 mg Oral Daily 03/07/24 0842 03/09/24 1046   03/06/24 1800  cefTRIAXone  (ROCEPHIN ) 1 g in sodium chloride  0.9 % 100 mL IVPB  Status:  Discontinued        1 g 200 mL/hr over 30 Minutes Intravenous Every 24 hours 03/06/24 1703 03/07/24 0842       Subjective: Patient was seen and examined at bedside. Overnight events noted. Ostomy has been reversed 03/12/24.  Patient has dressing noted on the right side of the abdomen.   Patient was lethargic in the morning RRT was called, with painful stimuli patient woke up. Now patient remains stable alert and oriented following commands.  Objective: Vitals:   03/15/24 0434 03/15/24 0500 03/15/24 0830 03/15/24 1043  BP: 128/70  94/61 126/78  Pulse:   (!) 110 (!) 103  Resp: 18  18 18   Temp: 98.1 F (36.7 C)  100 F (37.8 C) 98.6 F (37 C)  TempSrc:   Oral Oral  SpO2:   100% 100%  Weight:  91.4 kg    Height:        Intake/Output Summary (Last 24 hours) at 03/15/2024 1116 Last data filed at 03/15/2024 0700 Gross per 24 hour  Intake 720 ml  Output 1600 ml  Net -880 ml   Filed Weights   03/13/24 0500 03/14/24 0500 03/15/24 0500  Weight: 91.3 kg 91 kg 91.4 kg    Examination:  General exam: Appears calm and comfortable, not in any acute distress. Respiratory system: CTA  Bilaterally. Respiratory effort normal. RR 15 Cardiovascular system: S1 & S2 heard, RRR. No JVD, murmurs, rubs, gallops or clicks.  Gastrointestinal system: Abdomen is non distended, soft and Mildly tender.  Dressing noted, Normal bowel sounds heard. Central nervous system: Alert and oriented x 3. No focal neurological deficits. Extremities: No edema, no cyanosis, no clubbing. Skin: No rashes, lesions or ulcers Psychiatry: Judgement and insight appear normal. Mood & affect appropriate.     Data Reviewed: I have personally reviewed following labs and imaging studies  CBC: Recent Labs  Lab 03/11/24 0259 03/12/24 0240 03/13/24 0302 03/14/24 0757 03/15/24 0927  WBC 9.2 9.3 12.2* 10.3 8.6  NEUTROABS 5.9 5.8 9.2* 7.9* 6.2  HGB 8.6* 9.4* 8.7* 8.1* 7.2*  HCT 28.1* 30.6* 28.8* 27.7* 24.8*  MCV 87.0 87.2 88.3 89.4 89.2  PLT 245 228 185 156 158   Basic Metabolic Panel: Recent Labs  Lab 03/09/24 0333 03/10/24 0403 03/11/24 0259 03/12/24 0240 03/13/24 0302 03/14/24 0757 03/15/24 0524  NA 133* 138 139 140 136  139 143  K 3.9 4.2 4.4 4.4 4.2 3.9 3.9  CL 100 104 102 104 106 108 108  CO2 23 25 24 23  21* 21* 18*  GLUCOSE 109* 115* 107* 113* 123* 115* 100*  BUN 42* 31* 25* 18 17 17 21   CREATININE 2.98* 2.61* 2.40* 2.16* 2.01* 1.95* 1.82*  CALCIUM  7.7* 8.5* 8.5* 8.4* 8.0* 8.1* 8.0*  MG 1.6* 2.3 2.1 1.8 1.6*  --   --   PHOS  --  3.3  --   --  4.3  --   --    GFR: Estimated Creatinine Clearance: 38.5 mL/min (A) (by C-G formula based on SCr of 1.82 mg/dL (H)). Liver Function Tests: Recent Labs  Lab 03/09/24 0333 03/10/24 0403 03/11/24 0259 03/12/24 0240  AST 11* 19 18 18   ALT 9 9 9 8   ALKPHOS 54 56 58 59  BILITOT 0.5 0.5 0.5 0.4  PROT 5.5* 6.5 6.1* 6.4*  ALBUMIN  2.0* 2.7* 2.5* 2.6*   No results for input(s): LIPASE, AMYLASE in the last 168 hours. Recent Labs  Lab 03/09/24 0333  AMMONIA 21   Coagulation Profile: No results for input(s): INR, PROTIME in the last  168 hours. Cardiac Enzymes: No results for input(s): CKTOTAL, CKMB, CKMBINDEX, TROPONINI in the last 168 hours. BNP (last 3 results) No results for input(s): PROBNP in the last 8760 hours. HbA1C: No results for input(s): HGBA1C in the last 72 hours. CBG: Recent Labs  Lab 03/14/24 0605 03/15/24 0820  GLUCAP 89 97   Lipid Profile: No results for input(s): CHOL, HDL, LDLCALC, TRIG, CHOLHDL, LDLDIRECT in the last 72 hours. Thyroid  Function Tests: No results for input(s): TSH, T4TOTAL, FREET4, T3FREE, THYROIDAB in the last 72 hours. Anemia Panel: No results for input(s): VITAMINB12, FOLATE, FERRITIN, TIBC, IRON , RETICCTPCT in the last 72 hours. Sepsis Labs: Recent Labs  Lab 03/09/24 0333  PROCALCITON 0.72    Recent Results (from the past 240 hours)  Culture, blood (Routine X 2) w Reflex to ID Panel     Status: Abnormal   Collection Time: 03/06/24  1:26 PM   Specimen: Right Antecubital; Blood  Result Value Ref Range Status   Specimen Description   Final    RIGHT ANTECUBITAL Performed at [facility], [address] [place], [place] [address]    Special Requests   Final    BOTTLES DRAWN AEROBIC AND ANAEROBIC Blood Culture adequate volume Performed at [facility], [address], [place], [place] [address]    Culture  Setup Time   Final    GRAM NEGATIVE RODS IN BOTH AEROBIC AND ANAEROBIC BOTTLES CRITICAL RESULT CALLED TO, READ BACK BY AND VERIFIED WITH: PHARMD G ABBOTT 03/08/2024 @ 0305 BY AB Performed at [facility], [address], [place], [place] [address]    Culture KLEBSIELLA PNEUMONIAE (A)  Final   Report Status 03/10/2024 FINAL  Final   Organism ID, Bacteria KLEBSIELLA PNEUMONIAE  Final      Susceptibility   Klebsiella pneumoniae - MIC*    AMPICILLIN  RESISTANT Resistant     CEFAZOLIN (NON-URINE) 2 SENSITIVE Sensitive     CEFEPIME  <=0.12 SENSITIVE Sensitive     ERTAPENEM <=0.12  SENSITIVE Sensitive     CEFTRIAXONE  <=0.25 SENSITIVE Sensitive     CIPROFLOXACIN <=0.06 SENSITIVE Sensitive     GENTAMICIN <=1 SENSITIVE Sensitive     MEROPENEM <=0.25 SENSITIVE Sensitive     TRIMETH/SULFA <=20 SENSITIVE Sensitive     AMPICILLIN /SULBACTAM 4 SENSITIVE Sensitive  PIP/TAZO Value in next row Sensitive      <=4 SENSITIVEThis is a modified FDA-approved test that has been validated and its performance characteristics determined by the reporting laboratory.  This laboratory is certified under the Clinical Laboratory Improvement Amendments CLIA as qualified to perform high complexity clinical laboratory testing.    * KLEBSIELLA PNEUMONIAE  Blood Culture ID Panel (Reflexed)     Status: Abnormal   Collection Time: 03/06/24  1:26 PM  Result Value Ref Range Status   Enterococcus faecalis NOT DETECTED NOT DETECTED Final   Enterococcus Faecium NOT DETECTED NOT DETECTED Final   Listeria monocytogenes NOT DETECTED NOT DETECTED Final   Staphylococcus species NOT DETECTED NOT DETECTED Final   Staphylococcus aureus (BCID) NOT DETECTED NOT DETECTED Final   Staphylococcus epidermidis NOT DETECTED NOT DETECTED Final   Staphylococcus lugdunensis NOT DETECTED NOT DETECTED Final   Streptococcus species NOT DETECTED NOT DETECTED Final   Streptococcus agalactiae NOT DETECTED NOT DETECTED Final   Streptococcus pneumoniae NOT DETECTED NOT DETECTED Final   Streptococcus pyogenes NOT DETECTED NOT DETECTED Final   A.calcoaceticus-baumannii NOT DETECTED NOT DETECTED Final   Bacteroides fragilis NOT DETECTED NOT DETECTED Final   Enterobacterales DETECTED (A) NOT DETECTED Final    Comment: Enterobacterales represent a large order of gram negative bacteria, not a single organism. CRITICAL RESULT CALLED TO, READ BACK BY AND VERIFIED WITH: PHARMD G ABBOTT 03/08/2024 @ 0305 BY AB    Enterobacter cloacae complex NOT DETECTED NOT DETECTED Final   Escherichia coli NOT DETECTED NOT DETECTED Final    Klebsiella aerogenes NOT DETECTED NOT DETECTED Final   Klebsiella oxytoca NOT DETECTED NOT DETECTED Final   Klebsiella pneumoniae DETECTED (A) NOT DETECTED Final    Comment: CRITICAL RESULT CALLED TO, READ BACK BY AND VERIFIED WITH: PHARMD G ABBOTT 03/08/2024 @ 0305 BY AB    Proteus species NOT DETECTED NOT DETECTED Final   Salmonella species NOT DETECTED NOT DETECTED Final   Serratia marcescens NOT DETECTED NOT DETECTED Final   Haemophilus influenzae NOT DETECTED NOT DETECTED Final   Neisseria meningitidis NOT DETECTED NOT DETECTED Final   Pseudomonas aeruginosa NOT DETECTED NOT DETECTED Final   Stenotrophomonas maltophilia NOT DETECTED NOT DETECTED Final   Candida albicans NOT DETECTED NOT DETECTED Final   Candida auris NOT DETECTED NOT DETECTED Final   Candida glabrata NOT DETECTED NOT DETECTED Final   Candida krusei NOT DETECTED NOT DETECTED Final   Candida parapsilosis NOT DETECTED NOT DETECTED Final   Candida tropicalis NOT DETECTED NOT DETECTED Final   Cryptococcus neoformans/gattii NOT DETECTED NOT DETECTED Final   CTX-M ESBL NOT DETECTED NOT DETECTED Final   Carbapenem resistance IMP NOT DETECTED NOT DETECTED Final   Carbapenem resistance KPC NOT DETECTED NOT DETECTED Final   Carbapenem resistance NDM NOT DETECTED NOT DETECTED Final   Carbapenem resist OXA 48 LIKE NOT DETECTED NOT DETECTED Final   Carbapenem resistance VIM NOT DETECTED NOT DETECTED Final    Comment: Performed at [facility], [address], [place], [place] [address]  Culture, blood (Routine X 2) w Reflex to ID Panel     Status: None   Collection Time: 03/06/24  1:31 PM   Specimen: BLOOD LEFT FOREARM  Result Value Ref Range Status   Specimen Description   Final    BLOOD LEFT FOREARM Performed at [facility], [address], [place], [place] [address]    Special Requests   Final    BOTTLES DRAWN AEROBIC AND ANAEROBIC Blood Culture  adequate volume Performed at [facility],  [address], [place], [place] [address]    Culture   Final    NO GROWTH 5 DAYS Performed at [facility], [address] [place], [place] [address]    Report Status 03/11/2024 FINAL  Final  Urine Culture     Status: Abnormal   Collection Time: 03/06/24  3:13 PM   Specimen: Urine, Random  Result Value Ref Range Status   Specimen Description   Final    URINE, RANDOM Performed at [facility], [address], [place], [place] [address]    Special Requests   Final    NONE Reflexed from [id number] Performed at [facility], [address] [place], [place] [address]    Culture >=100,000 COLONIES/mL KLEBSIELLA PNEUMONIAE (A)  Final   Report Status 03/08/2024 FINAL  Final   Organism ID, Bacteria KLEBSIELLA PNEUMONIAE (A)  Final      Susceptibility   Klebsiella pneumoniae - MIC*    AMPICILLIN  RESISTANT Resistant     CEFAZOLIN (URINE) Value in next row Sensitive      2 SENSITIVEThis is a modified FDA-approved test that has been validated and its performance characteristics determined by the reporting laboratory.  This laboratory is certified under the Clinical Laboratory Improvement Amendments CLIA as qualified to perform high complexity clinical laboratory testing.    CEFEPIME  Value in next row Sensitive      2 SENSITIVEThis is a modified FDA-approved test that has been validated and its performance characteristics determined by the reporting laboratory.  This laboratory is certified under the Clinical Laboratory Improvement Amendments CLIA as qualified to perform high complexity clinical laboratory testing.    ERTAPENEM Value in next row Sensitive      2 SENSITIVEThis is a modified FDA-approved test that has been validated and its performance characteristics determined by the reporting laboratory.  This laboratory is certified under the Clinical Laboratory Improvement Amendments CLIA as qualified to perform high complexity clinical laboratory  testing.    CEFTRIAXONE  Value in next row Sensitive      2 SENSITIVEThis is a modified FDA-approved test that has been validated and its performance characteristics determined by the reporting laboratory.  This laboratory is certified under the Clinical Laboratory Improvement Amendments CLIA as qualified to perform high complexity clinical laboratory testing.    CIPROFLOXACIN Value in next row Sensitive      2 SENSITIVEThis is a modified FDA-approved test that has been validated and its performance characteristics determined by the reporting laboratory.  This laboratory is certified under the Clinical Laboratory Improvement Amendments CLIA as qualified to perform high complexity clinical laboratory testing.    GENTAMICIN Value in next row Sensitive      2 SENSITIVEThis is a modified FDA-approved test that has been validated and its performance characteristics determined by the reporting laboratory.  This laboratory is certified under the Clinical Laboratory Improvement Amendments CLIA as qualified to perform high complexity clinical laboratory testing.    NITROFURANTOIN Value in next row Intermediate      2 SENSITIVEThis is a modified FDA-approved test that has been validated and its performance characteristics determined by the reporting laboratory.  This laboratory is certified under the Clinical Laboratory Improvement Amendments CLIA as qualified to perform high complexity clinical laboratory testing.    TRIMETH/SULFA Value in next row Sensitive      2 SENSITIVEThis is a modified FDA-approved test that has been validated and its performance characteristics determined by the  reporting laboratory.  This laboratory is certified under the Clinical Laboratory Improvement Amendments CLIA as qualified to perform high complexity clinical laboratory testing.    AMPICILLIN /SULBACTAM Value in next row Sensitive      2 SENSITIVEThis is a modified FDA-approved test that has been validated and its performance  characteristics determined by the reporting laboratory.  This laboratory is certified under the Clinical Laboratory Improvement Amendments CLIA as qualified to perform high complexity clinical laboratory testing.    PIP/TAZO Value in next row Sensitive      <=4 SENSITIVEThis is a modified FDA-approved test that has been validated and its performance characteristics determined by the reporting laboratory.  This laboratory is certified under the Clinical Laboratory Improvement Amendments CLIA as qualified to perform high complexity clinical laboratory testing.    MEROPENEM Value in next row Sensitive      <=4 SENSITIVEThis is a modified FDA-approved test that has been validated and its performance characteristics determined by the reporting laboratory.  This laboratory is certified under the Clinical Laboratory Improvement Amendments CLIA as qualified to perform high complexity clinical laboratory testing.    * >=100,000 COLONIES/mL KLEBSIELLA PNEUMONIAE    Radiology Studies: No results found.  Scheduled Meds:  acetaminophen   500 mg Oral TID WC & HS   alvimopan   12 mg Oral BID   bisacodyl  10 mg Rectal Daily   busPIRone   10 mg Oral TID   carvedilol   3.125 mg Oral BID WC   Chlorhexidine  Gluconate Cloth  6 each Topical Daily   Chlorhexidine  Gluconate Cloth  6 each Topical Once   feeding supplement  237 mL Oral TID BM   finasteride   5 mg Oral Daily   folic acid   1 mg Oral Daily   heparin   5,000 Units Subcutaneous Q8H   melatonin  5 mg Oral QHS   multivitamin with minerals  1 tablet Oral Daily   pantoprazole   40 mg Oral Daily   polycarbophil  625 mg Oral BID   QUEtiapine  25 mg Oral Q0600   QUEtiapine  50 mg Oral QHS   sodium chloride  flush  3 mL Intravenous Q12H   tamsulosin   0.4 mg Oral Daily   Continuous Infusions:  sodium chloride        LOS: 9 days    Time spent: 35 mins    Darcel Dawley, MD [facility]   If 7PM-7AM, please contact night-coverage

## 2024-03-15 NOTE — Progress Notes (Signed)
 Patient was difficult to wake this morning, sternum rub performed with no response. BP 94/61 with tachycardia, MD notified and rapid response was called. Charge RN assisted and patient responded to pain, opened eyes and was able to tell us  his DOB. Patient closed eyes, pulse remained elevated with normal breathing pattern and even breath sounds, no labored breathing. Patient now sitting in bed with eyes opened and responding to questions asked. Patient remains more alert with bed in lowest position and call light within reach.

## 2024-03-15 NOTE — TOC Progression Note (Signed)
 Transition of Care [facility]) - Progression Note    Patient Details  Name: Calvin Escobar MRN: [id number] Date of Birth: May 08, 1951  Transition of Care [facility]) CM/SW Contact  Geordie Nooney LITTIE [place], [place] Phone Number: 03/15/2024, 12:31 PM  Clinical Narrative:    CSW initiated insurance auth for [facility]. Auth currently pending, auth ID# [id number]. CSW will follow up.   Expected Discharge Plan: Skilled Nursing Facility Barriers to Discharge: Awaiting State Approval THEONE)               Expected Discharge Plan and Services In-house Referral: Clinical Social Work   Post Acute Care Choice: Skilled Nursing Facility Living arrangements for the past 2 months: Single Family Home, Skilled Nursing Facility                                       Social Drivers of Health (SDOH) Interventions SDOH Screenings   Food Insecurity: Patient Unable To Answer (03/07/2024)  Housing: Unknown (03/07/2024)  Transportation Needs: Patient Unable To Answer (03/07/2024)  Utilities: Not At Risk (03/07/2024)  Alcohol Screen: Low Risk  (10/10/2023)  Depression (PHQ2-9): Low Risk  (12/09/2023)  Recent Concern: Depression (PHQ2-9) - Medium Risk (09/16/2023)  Financial Resource Strain: Low Risk  (10/10/2023)  Physical Activity: Sufficiently Active (10/10/2023)  Social Connections: Moderately Integrated (03/07/2024)  Recent Concern: Social Connections - Socially Isolated (12/11/2023)  Stress: No Stress Concern Present (10/10/2023)  Tobacco Use: Low Risk  (03/12/2024)  Health Literacy: Adequate Health Literacy (10/10/2023)    Readmission Risk Interventions    03/09/2024    3:43 PM 01/14/2024    2:09 PM 07/04/2023    1:27 PM  Readmission Risk Prevention Plan  Transportation Screening Complete  Complete  PCP or Specialist Appt within 5-7 Days   Complete  Home Care Screening   Complete  Medication Review (RN CM)   Complete  Medication Review (RN Care Manager) Complete Complete   PCP or Specialist appointment  within 3-5 days of discharge Complete Complete   HRI or Home Care Consult Complete Complete   SW Recovery Care/Counseling Consult Complete Complete   Palliative Care Screening Not Applicable Not Applicable   Skilled Nursing Facility Complete Complete

## 2024-03-15 NOTE — Progress Notes (Signed)
 3 Days Post-Op  Subjective: Mostly lethargic today, sleeping and difficult to wake up.  When he does barely awaken he tells me he feels sick today but he won't elaborate on this.  His calorie count appears that he is eating some and no reports of nausea.  Appears he had 6 stools yesterday.  Objective: Vital signs in last 24 hours: Temp:  [97.7 F (36.5 C)-98.1 F (36.7 C)] 98.1 F (36.7 C) (10/12 0434) Pulse Rate:  [93-107] 93 (10/11 1949) Resp:  [17-18] 18 (10/12 0434) BP: (113-133)/(70-75) 128/70 (10/12 0434) SpO2:  [98 %-100 %] 98 % (10/11 1949) Weight:  [91.4 kg] 91.4 kg (10/12 0500) Last BM Date : 03/14/24  Intake/Output from previous day: 10/11 0701 - 10/12 0700 In: 1083 [P.O.:1080; I.V.:3] Out: 1600 [Urine:1600] Intake/Output this shift: No intake/output data recorded.  PE: Gen: sleepy Lungs: respiratory effort nonlabored Abd: soft, nontender, RLQ incision is c/d/I with no erythema or drainage  Lab Results:  Recent Labs    03/13/24 0302 03/14/24 0757  WBC 12.2* 10.3  HGB 8.7* 8.1*  HCT 28.8* 27.7*  PLT 185 156   BMET Recent Labs    03/14/24 0757 03/15/24 0524  NA 139 143  K 3.9 3.9  CL 108 108  CO2 21* 18*  GLUCOSE 115* 100*  BUN 17 21  CREATININE 1.95* 1.82*  CALCIUM  8.1* 8.0*   PT/INR No results for input(s): LABPROT, INR in the last 72 hours. CMP     Component Value Date/Time   NA 143 03/15/2024 0524   NA 143 04/14/2021 1125   K 3.9 03/15/2024 0524   CL 108 03/15/2024 0524   CO2 18 (L) 03/15/2024 0524   GLUCOSE 100 (H) 03/15/2024 0524   BUN 21 03/15/2024 0524   BUN 21 04/14/2021 1125   CREATININE 1.82 (H) 03/15/2024 0524   CREATININE 1.52 (H) 10/25/2023 1555   CALCIUM  8.0 (L) 03/15/2024 0524   PROT 6.4 (L) 03/12/2024 0240   PROT 7.0 10/28/2019 1629   ALBUMIN  2.6 (L) 03/12/2024 0240   ALBUMIN  4.2 10/28/2019 1629   AST 18 03/12/2024 0240   ALT 8 03/12/2024 0240   ALKPHOS 59 03/12/2024 0240   BILITOT 0.4 03/12/2024 0240    BILITOT 0.4 10/28/2019 1629   GFRNONAA 38 (L) 03/15/2024 0524   GFRAA 67 11/13/2019 0000   Lipase     Component Value Date/Time   LIPASE 15.0 01/15/2023 1243       Studies/Results: No results found.  Anti-infectives: Anti-infectives (From admission, onward)    Start     Dose/Rate Route Frequency Ordered Stop   03/12/24 2200  cefoTEtan  (CEFOTAN ) 2 g in sodium chloride  0.9 % 100 mL IVPB  Status:  Discontinued        2 g 200 mL/hr over 30 Minutes Intravenous Every 12 hours 03/12/24 1957 03/12/24 2043   03/12/24 0600  cefoTEtan  (CEFOTAN ) 2 g in sodium chloride  0.9 % 100 mL IVPB        2 g 200 mL/hr over 30 Minutes Intravenous On call to O.R. 03/11/24 1206 03/12/24 1550   03/10/24 1315  ceFAZolin (ANCEF) IVPB 2g/100 mL premix        2 g 200 mL/hr over 30 Minutes Intravenous Every 12 hours 03/10/24 1221 03/14/24 1759   03/07/24 1800  cefTRIAXone  (ROCEPHIN ) 2 g in sodium chloride  0.9 % 100 mL IVPB  Status:  Discontinued        2 g 200 mL/hr over 30 Minutes Intravenous Every 24 hours  03/07/24 0842 03/10/24 1221   03/07/24 0930  azithromycin (ZITHROMAX) tablet 500 mg  Status:  Discontinued        500 mg Oral Daily 03/07/24 0842 03/09/24 1046   03/06/24 1800  cefTRIAXone  (ROCEPHIN ) 1 g in sodium chloride  0.9 % 100 mL IVPB  Status:  Discontinued        1 g 200 mL/hr over 30 Minutes Intravenous Every 24 hours 03/06/24 1703 03/07/24 0842        Assessment/Plan POD 3, s/p takedown of diverting loop ileostomy, Dr. Sheldon -on a D1 diet.  Appears to have eaten some yesterday per calorie count.   -per SLP, needs to stay on D1 diet for now due to dysphagia -calorie count in process -+ BM x6 yesterday -WBC normal yesterday, AF, CBC pending today.  BMET looks stable -mobilize as able with therapies, PT/OT/SLP consults in place -will continue to monitor    FEN - D1 diet VTE - heparin  ID - none   LOS: 9 days    Burnard FORBES Banter , [facility] Surgery 03/15/2024, 7:55  AM Please see Amion for pager number during day hours 7:00am-4:30pm or 7:00am -11:30am on weekends

## 2024-03-15 NOTE — Progress Notes (Signed)
 Patient is more alert since this morning, although he is fatigued, noticed sleeping majority of the day. Patient attempted to stand with assistance of this nurse to transfer to chair, unable to stand d/t being fatigued. Patient assisted back to lay down. Patient now resting comfortably in bed, breathing patten normal.

## 2024-03-15 NOTE — Progress Notes (Signed)
 Went in to reassess patient's vitals and orientation. Patient no longer requiring physically stimulation to wake and is now following commands.

## 2024-03-15 NOTE — Plan of Care (Signed)

## 2024-03-16 DIAGNOSIS — D649 Anemia, unspecified: Secondary | ICD-10-CM

## 2024-03-16 DIAGNOSIS — Z9889 Other specified postprocedural states: Secondary | ICD-10-CM

## 2024-03-16 DIAGNOSIS — R195 Other fecal abnormalities: Secondary | ICD-10-CM

## 2024-03-16 DIAGNOSIS — N179 Acute kidney failure, unspecified: Secondary | ICD-10-CM | POA: Diagnosis not present

## 2024-03-16 DIAGNOSIS — N1831 Chronic kidney disease, stage 3a: Secondary | ICD-10-CM | POA: Diagnosis not present

## 2024-03-16 LAB — BASIC METABOLIC PANEL WITH GFR
Anion gap: 12 (ref 5–15)
BUN: 22 mg/dL (ref 8–23)
CO2: 21 mmol/L — ABNORMAL LOW (ref 22–32)
Calcium: 8.4 mg/dL — ABNORMAL LOW (ref 8.9–10.3)
Chloride: 109 mmol/L (ref 98–111)
Creatinine, Ser: 1.87 mg/dL — ABNORMAL HIGH (ref 0.61–1.24)
GFR, Estimated: 37 mL/min — ABNORMAL LOW (ref >60)
Glucose, Bld: 108 mg/dL — ABNORMAL HIGH (ref 70–99)
Potassium: 4.1 mmol/L (ref 3.5–5.1)
Sodium: 142 mmol/L (ref 135–145)

## 2024-03-16 LAB — CBC WITH DIFFERENTIAL/PLATELET
Abs Immature Granulocytes: 0.08 K/uL — ABNORMAL HIGH (ref 0.00–0.07)
Basophils Absolute: 0.2 K/uL — ABNORMAL HIGH (ref 0.0–0.1)
Basophils Relative: 3 %
Eosinophils Absolute: 0.2 K/uL (ref 0.0–0.5)
Eosinophils Relative: 2 %
HCT: 23.6 % — ABNORMAL LOW (ref 39.0–52.0)
Hemoglobin: 7 g/dL — ABNORMAL LOW (ref 13.0–17.0)
Immature Granulocytes: 1 %
Lymphocytes Relative: 22 %
Lymphs Abs: 1.8 K/uL (ref 0.7–4.0)
MCH: 26.4 pg (ref 26.0–34.0)
MCHC: 29.7 g/dL — ABNORMAL LOW (ref 30.0–36.0)
MCV: 89.1 fL (ref 80.0–100.0)
Monocytes Absolute: 0.5 K/uL (ref 0.1–1.0)
Monocytes Relative: 6 %
Neutro Abs: 5.3 K/uL (ref 1.7–7.7)
Neutrophils Relative %: 66 %
Platelets: 193 K/uL (ref 150–400)
RBC: 2.65 MIL/uL — ABNORMAL LOW (ref 4.22–5.81)
RDW: 15.4 % (ref 11.5–15.5)
WBC: 8.1 K/uL (ref 4.0–10.5)
nRBC: 0 % (ref 0.0–0.2)

## 2024-03-16 LAB — RETICULOCYTES
Immature Retic Fract: 17.9 % — ABNORMAL HIGH (ref 2.3–15.9)
RBC.: 2.64 MIL/uL — ABNORMAL LOW (ref 4.22–5.81)
Retic Count, Absolute: 70.5 K/uL (ref 19.0–186.0)
Retic Ct Pct: 2.7 % (ref 0.4–3.1)

## 2024-03-16 LAB — IRON AND TIBC
Iron: 37 ug/dL — ABNORMAL LOW (ref 45–182)
Saturation Ratios: 26 % (ref 17.9–39.5)
TIBC: 143 ug/dL — ABNORMAL LOW (ref 250–450)
UIBC: 106 ug/dL

## 2024-03-16 LAB — FOLATE: Folate: >20 ng/mL (ref >5.9)

## 2024-03-16 LAB — PREPARE RBC (CROSSMATCH)

## 2024-03-16 LAB — VITAMIN B12: Vitamin B-12: 298 pg/mL (ref 180–914)

## 2024-03-16 LAB — SURGICAL PATHOLOGY

## 2024-03-16 LAB — FERRITIN: Ferritin: 635 ng/mL — ABNORMAL HIGH (ref 24–336)

## 2024-03-16 MED ORDER — IRON SUCROSE 200 MG IVPB - SIMPLE MED
200.0000 mg | Status: DC
  Filled 2024-03-16: qty 110

## 2024-03-16 MED ORDER — SODIUM CHLORIDE 0.9% IV SOLUTION: Freq: Once | INTRAVENOUS | Status: AC

## 2024-03-16 MED ORDER — HALOPERIDOL LACTATE 5 MG/ML IJ SOLN
1.0000 mg | Freq: Once | INTRAMUSCULAR | Status: AC
  Administered 2024-03-17: 1 mg via INTRAVENOUS
  Filled 2024-03-16: qty 1

## 2024-03-16 MED ORDER — PANTOPRAZOLE SODIUM 40 MG IV SOLR
40.0000 mg | Freq: Two times a day (BID) | INTRAVENOUS | Status: DC
  Administered 2024-03-16 – 2024-03-17 (×4): 40 mg via INTRAVENOUS
  Filled 2024-03-16 (×5): qty 10

## 2024-03-16 MED ORDER — SODIUM CHLORIDE 0.9 % IV SOLN
200.0000 mg | INTRAVENOUS | Status: DC
  Administered 2024-03-16: 200 mg via INTRAVENOUS
  Filled 2024-03-16: qty 10

## 2024-03-16 NOTE — Progress Notes (Addendum)
 Mobility Specialist Progress Note:    03/16/24 0900  Mobility  Activity Pivoted/transferred from bed to chair;Pivoted/transferred from chair to bed  Level of Assistance Minimal assist, patient does 75% or more  Assistive Device Front wheel walker  Distance Ambulated (ft) 16 ft  Activity Response Tolerated well  Mobility Referral Yes  Mobility visit 1 Mobility  Mobility Specialist Start Time (ACUTE ONLY) 0911  Mobility Specialist Stop Time (ACUTE ONLY) 0925  Mobility Specialist Time Calculation (min) (ACUTE ONLY) 14 min   Received pt in bed and agreeable to mobility. Pt required MinA STS, otherwise MinG. Pt ambulated to chair and back to the bed. Left pt in bed with alarm on. Personal belongings and call light within reach. All needs met. RN present.  Calvin Escobar Mobility Specialist  Please contact via [employer] or  Rehab Office [phone]

## 2024-03-16 NOTE — Consult Note (Addendum)
 Consultation Note   Referring Provider:   [facility] PCP: [name], MD Primary Gastroenterologist:   Victory Brand, MD      Reason for Consultation:  Black stool, anemia DOA: 03/06/2024         Hospital Day: 11    Attending physician's note  I personally saw the patient and performed a substantive portion of the medical decision making process for this encounter (including a complete performance of the key components : MDM, Hx and Exam), in conjunction with the APP.  I agree with the APP's note, impression, and  the management plan for the number and complexity of problems addressed at the encounter for the patient and take responsibility for that plan with its inherent risk of complications, morbidity, or mortality with additional input as follows.     73 year old male with history of colovesical fistula with abscess, UTI, rectosigmoid stricture status post LAR with anastomosis, takedown of diverting loop ileostomy on 10 /9 with worsening anemia and dark stool concerning for acute upper GI bleed  On exam abdomen soft, no tenderness or distention  Will plan for EGD for evaluation, exclude erosive gastritis, duodenitis, gastroduodenal ulcer or erosions N.p.o. after midnight Monitor hemoglobin and transfuse if below 7 PPI twice daily   K. Veena Hasan Douse , MD [phone]     ASSESSMENT    73 yo male with a history of UTI complicated by colovesical fistula with abscess. Also found to have a rectosigmoid stricture  s/p LAR with anastomosis , colovesical fistula takedown and repair, diverting loop ileostomy, drainage of pelvic abscess followed by ileostomy takedown on 10/9.   Acute on chronic anemia / dark stool Nausea No evidence for iron  deficiency.  Hgb slowly declining over last few days. Stools dark but got 3-4 doses of oral iron  last week. Unlikely post-op bleed. Normal BUN, hemodynamically stable but need to rule out  upper GI bleed. He is up to date on colonoscopy ( done in July 2025)    History of recurrent C-diff infection   CKD3  HFrEF  OSA  Chronic anxiety  Dementia  See PMH for any additional medical history  / medical problems  Principal Problem:   Acute kidney injury superimposed on stage 3a chronic kidney disease (HCC) with hyperkalemia Active Problems:   Bipolar affective disorder, depressed (HCC)   Dementia (HCC)   HLD (hyperlipidemia)   Hyperkalemia   Morbid obesity (HCC)   Hypertension   Chronic kidney disease, stage 3a (HCC)   Anemia of chronic disease   Recurrent UTI   GERD (gastroesophageal reflux disease)   Hydronephrosis, right   Chronic HFrEF (heart failure with reduced ejection fraction) (HCC)   History of urinary retention   Chronic indwelling Foley catheter   Ileostomy in place [facility])   Malnutrition of moderate degree   OSA on CPAP   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  --Schedule for EGD to be done tomorrow. Patient has dementia. He asks me to consent wife so I will contact her.   Addendum: I spoke  with wife [name]. The risks and benefits of EGD with possible biopsies were discussed with [name] who agrees to proceed.    HPI   In December 2024 patient was hospitalized with sepsis / UTI / emphysematous cystitis and concern for colovesical fistula. Conservative management recommended. Reportedly had cystoscopy  that didn't show evidence for fistula.  Rehospitalized in January 2025 severe sepsis / UTI / anemia. Treated with antibiotics. Then in February 2025 he was hospitalized with C-diff as well as recurrent UTI sepsis. Saw [facility] in April and plan was for resection following a pre-op colonoscopy. Saw Dr. Legrand in office to get this arranged but prior  to procedure being done he ended up back in hospital in July with COVID 19 / fever / candidemia. While inpatient we proceeded with pre-op colonoscopy which showed severe luminal narrowing in the distal sigmoid colon with severe diverticular disease, suspected site of the fistula.   On 01/09/24 he underwent LAR with anastomosis , colovesical fistula takedown and repair, diverting loop ileostomy, drainage of pelvic abscess, LOA.   On 03/12/24 he underwent ileostomy takedown   Hgb has continuously declined from since ileostomy from 9.4 to 7.0. Got IV  on 10/6. Took oral iron  10/6 thru 10/9. Has been on SQ heparin . Today had a dark BM per nurse  Patient complains of nausea, started this admission. He complains of abdominal pain ( points to area of surgical incision).     Pertinent GI Studies   12/05/23 Colonoscopy - Diverticulosis in the entire examined colon. - Severe luminal narrowing in the distal sigmoid colon with severe diverticular disease, I suspect site of the fistula in that area. - Internal hemorrhoids. - The examination was otherwise normal. - No polyps.    Labs and Imaging:  No results for input(s): PROT, ALBUMIN , AST, ALT, ALKPHOS, BILITOT, BILIDIR, IBILI in the last 72 hours. Recent Labs    03/14/24 0757 03/15/24 0927 03/16/24 0727  WBC 10.3 8.6 8.1  HGB 8.1* 7.2* 7.0*  HCT 27.7* 24.8* 23.6*  MCV 89.4 89.2 89.1  PLT 156 158 193   Recent Labs    03/14/24 0757 03/15/24 0524 03/16/24 0727  NA 139 143 142  K 3.9 3.9 4.1  CL 108 108 109  CO2 21* 18* 21*  GLUCOSE 115* 100* 108*  BUN 17 21 22   CREATININE 1.95* 1.82* 1.87*  CALCIUM  8.1* 8.0* 8.4*     CT PELVIS WO CONTRAST CLINICAL DATA:  History of colovesical fistula repair. Rule out anastomotic leak or recurrent fistula.  EXAM: CT CYSTOGRAM (CT PELVIS WITH CONTRAST)  TECHNIQUE: Multidetector CT imaging through the pelvis was performed after dilute contrast had been introduced into the bladder  for the purposes of performing CT cystography.  RADIATION DOSE REDUCTION: This exam was performed according to the departmental dose-optimization program which includes automated exposure control, adjustment of the mA and/or kV according to patient size and/or use of iterative reconstruction technique.  CONTRAST:  OMNIPAQUE  IOHEXOL  300 MG/ML  SOLN  COMPARISON:  03/07/2024  FINDINGS: Initially, a cystogram was performed with pre and post bladder contrast imaging. Subsequently, after draining the bladder of contrast, rectal contrast was administered.  Urinary Tract: The mild left-sided hydronephrosis has resolved. The mild-to-moderate right-sided hydroureteronephrosis is improved.  Foley catheter within the bladder.  Intra bladder contrast demonstrates multiple small saccules. Persistent bladder wall thickening. No contrast identified outside the bladder or within the adjacent bowel.  Bowel: Surgical sutures within the distal sigmoid. Colonic contrast opacification is moderate within the rectum  and distal sigmoid. More proximal contrast not present. The patient removed the enema tip prior to more proximal contrast opacification.  No anastomotic leak identified.  Scattered colonic diverticula.  Diverting right lower quadrant loop ileostomy.  Vascular/Lymphatic: Aortic Atherosclerosis. No pelvic sidewall adenopathy.  Reproductive:  Mild prostatomegaly.  Other:  No extraluminal gas or free pelvic fluid.  Musculoskeletal: Presumed bone island in the right ischium.  IMPRESSION: 1. Performance of a cystogram followed by a pelvic CT with rectal contrast administration demonstrates no evidence of contrast extravasation or colovesical fistula. 2. Prostatomegaly with bladder wall thickening and saccules, suggesting outlet obstruction. 3. Resolved left and improved right-sided hydroureteronephrosis.  Electronically Signed   By: Rockey Kilts M.D.   On: 03/10/2024  17:02     Past Medical History:  Diagnosis Date   Acute cystitis 05/13/2023   Acute hyponatremia 05/13/2023   Acute kidney injury superimposed on chronic kidney disease 05/13/2023   CLINICAL CONTEXT: 73 year old male with stage 3 CKD, colovesical fistula, recurrent UTIs, and recent candidal UTI. Recent renal ultrasound (09/28/2023) confirmed mild left hydronephrosis and prostatic mass protruding into bladder base. Iron  studies consistent with anemia of chronic disease (iron  32, TIBC 238, ferritin 420).   DIAGNOSTIC ASSESSMENT: ? 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 History of GI bleeding (07/2023). Currently on ferrous sulfate  supplementation.   DIAGNOSTIC ASSESSMENT: ? Primary etiology (70-80% probability): Anemia of chronic disease/inflammation ? 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  EF 35 to 40% on echo 04/2021   Dementia (HCC)    Depression    Diarrhea 05/13/2023   E coli bacteremia 05/14/2023   High anion gap metabolic acidosis 07/13/2023   History of Clostridioides difficile colitis 08/15/2023   Associated with colovesical fistula? Recurrent  antibiotic(s) requirement     Hyperkalemia    Associated with losartan   Advised patient low potassium diet 08/2022             Lab Results      Component    Value    Date/Time           K    4.0    12/27/2023 01:20 PM           K    4.4    12/18/2023 05:07 AM           K    4.4    12/17/2023 06:01 AM           K    4.7    12/16/2023 06:15 AM           K    4.9    12/15/2023 08:21 AM           K    4.4  12/14/2023 06:00 AM           K    5.0    Hyperlipidemia    Hypertension    Leukocytosis 12/11/2023   Lab Results      Component    Value    Date/Time           WBC    6.2    12/27/2023 01:20 PM           WBC    11.6 (H)    12/18/2023 05:07 AM           WBC    9.6    12/17/2023 06:01 AM           WBC    9.4    12/16/2023 06:15 AM           WBC    12.7 (H)    12/15/2023 08:21 AM           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  Work-up with cardiology in January 2021.     catheterization    Assessment:     1. Normal coronaries  2. NICM with EF 25-30%  3. Normal hemodynamics  .     Pneumonia    PUD (peptic ulcer disease) 01/12/2015   Formatting of this note might be different from the original.  Endoscopy esophageal stricture 2016     Septic shock from UTI 06/02/2023   Severe sepsis (HCC) 06/03/2023   SIRS (systemic inflammatory response syndrome) (HCC) 12/11/2023   SKIN CANCER, HX OF 05/10/2007   Facial left check and forehead and r shoulder  F/w derm   Syncope 05/13/2023   Thrombocytopenia 06/02/2023   Lab Results      Component    Value    Date           PLT    301    07/19/2023           PLT    324    07/18/2023           PLT    268    07/16/2023           PLT    207    07/14/2023           PLT    228    07/13/2023             Lab Results      Component    Value    Date           ESRSEDRATE    60 (H)    05/08/2023     No results found for: CRP          Lab Results       Component    Value       Urinary catheter complication 06/24/2023   Urinary dribbling 03/14/2022   Urinary incontinence 11/21/2022      Urinary Incontinence: They are wearing disposable diapers daily due to urinary incontinence, primarily nocturnal, and are currently on Tamsulosin  (Flomax ). We will refer them to Urology for further evaluation and potential treatment options and continue Tamsulosin  until they are seen by Urology.     Urinary tract infection without hematuria 06/02/2023   UTI (urinary tract infection) 07/01/2023    Past Surgical History:  Procedure Laterality Date  COLECTOMY, SIGMOID, ROBOT-ASSISTED N/A 01/09/2024   Procedure: ROBOTIC LOW ANTERIOR RESECTION WITH ANASTOMOSIS, DRAINAGE OF PELVIC ABSCESS, COLOVESICAL FISTULA TAKEDOWN AND REPAIR, INTRAOPERATIVE ASSESSMENT OF TISSUE PERFUSION USING ICG DYE, AND BILATERAL TAP BLOCK;  Surgeon: Sheldon Standing, MD;  Location: [facility];  Service: General;  Laterality: N/A;  ROBOTIC RESECTION OF COLON RECTOSIGMOID   COLONOSCOPY     COLONOSCOPY N/A 12/05/2023   Procedure: COLONOSCOPY;  Surgeon: Leigh Standing SQUIBB, MD;  Location[facility];  Service: Gastroenterology;  Laterality: N/A;   CYSTOSCOPY WITH INDOCYANINE GREEN  IMAGING (ICG) N/A 01/09/2024   Procedure: CYSTOSCOPY WITH INDOCYANINE GREEN  IMAGING (ICG);  Surgeon: Watt Rush, MD;  Location: [facility];  Service: Urology;  Laterality: N/A;  CYSTO WITH FIREFLY INJECTION   FLEXIBLE SIGMOIDOSCOPY N/A 01/09/2024   Procedure: SIGMOIDOSCOPY, FLEXIBLE;  Surgeon: Sheldon Standing, MD;  Location: [facility];  Service: General;  Laterality: N/A;   ILEOSTOMY CLOSURE N/A 03/12/2024   Procedure: CLOSURE, ILEOSTOMY;  Surgeon: Sheldon Standing, MD;  Location: [facility];  Service: General;  Laterality: N/A;  GEN WITH ERAS PATHWAY   LAPAROSCOPIC LOOP COLOSTOMY N/A 01/09/2024   Procedure: CREATION OF DIVERTING LOOP ILEOSTOMY;  Surgeon: Sheldon Standing, MD;  Location: [facility];  Service: General;  Laterality: N/A;   RECTAL EXAM  UNDER ANESTHESIA N/A 03/12/2024   Procedure: EXAM UNDER ANESTHESIA, RECTUM;  Surgeon: Sheldon Standing, MD;  Location: [facility];  Service: General;  Laterality: N/A;   RIGHT/LEFT HEART CATH AND CORONARY ANGIOGRAPHY N/A 07/03/2019   Procedure: RIGHT/LEFT HEART CATH AND CORONARY ANGIOGRAPHY;  Surgeon: Cherrie Toribio SAUNDERS, MD;  Location: [facility] IN[facility]AS[facility];  Service: Cardiovascular;  Laterality: N/A;   ROBOTIC ASSISTED LAPAROSCOPIC LYSIS OF ADHESION  01/09/2024   Procedure: LYSIS, ADHESIONS, ROBOT-ASSISTED, LAPAROSCOPIC;  Surgeon: Sheldon Standing, MD;  Location: [facility];  Service: General;;    Family History  Problem Relation Age of Onset   Hypertension Mother    Cancer Father    Bone cancer Sister     Prior to Admission medications   Medication Sig Start Date End Date Taking? Authorizing Provider  ARIPiprazole  (ABILIFY ) 2 MG tablet Take 1 tablet (2 mg total) by mouth in the morning. 08/22/23  Yes [name], MD  busPIRone  (BUSPAR ) 10 MG tablet Take 1 tablet (10 mg total) by mouth 3 (three) times daily. 09/16/23  Yes [name], MD  carvedilol  (COREG ) 6.25 MG tablet Take 6.25 mg by mouth in the morning and at bedtime.   Yes [provider]  diphenoxylate-atropine (LOMOTIL) 2.5-0.025 MG tablet Take 1 tablet by mouth in the morning and at bedtime.   Yes [provider]  ferrous sulfate  325 (65 FE) MG tablet Take 1 tablet (325 mg total) by mouth 2 (two) times daily with a meal. 01/14/24  Yes Gross, Standing, MD  finasteride  (PROSCAR ) 5 MG tablet Take 5 mg by mouth daily. 11/06/23  Yes [provider]  folic acid  (FOLVITE ) 1 MG tablet Take 1 mg by mouth daily. 10/21/23  Yes [provider]  gabapentin  (NEURONTIN ) 300 MG capsule Take 300 mg by mouth at bedtime. 01/01/24  Yes [provider]  HYDROcodone -acetaminophen  (NORCO/VICODIN) 5-325 MG tablet Take 1 tablet by mouth every 6 (six) hours as needed for moderate pain (pain score 4-6) or severe pain (pain score  7-10). 02/09/24  Yes Adhikari, Ivonne, MD  loperamide  (IMODIUM  A-D) 2 MG tablet Take 6 mg by mouth 4 (four) times daily.   Yes [provider]  omeprazole  (PRILOSEC) 40 MG capsule Take 40  mg by mouth See admin instructions. Take 40 mg by mouth before breakfast- may be opened and sprinkled on applesauce   Yes [provider]  ondansetron  (ZOFRAN ) 4 MG tablet Take 4 mg by mouth every 6 (six) hours as needed for nausea. 01/07/24  Yes [provider]  polycarbophil (FIBERCON) 625 MG tablet Take 1 tablet (625 mg total) by mouth 2 (two) times daily. 01/14/24  Yes Sheldon Standing, MD  pravastatin  (PRAVACHOL ) 20 MG tablet TAKE 1 TABLET BY MOUTH EVERYDAY AT BEDTIME Patient taking differently: Take 20 mg by mouth at bedtime. 01/29/24  Yes [name], MD  primidone  (MYSOLINE ) 50 MG tablet Take 1 tablet (50 mg total) by mouth 2 (two) times daily. Patient taking differently: Take 50 mg by mouth in the morning and at bedtime. 06/11/23  Yes Krishnan, Gokul, MD  sodium bicarbonate  650 MG tablet Take 2 tablets (1,300 mg total) by mouth 2 (two) times daily. 02/09/24  Yes Jillian Buttery, MD  tamsulosin  (FLOMAX ) 0.4 MG CAPS capsule TAKE 1 CAPSULE BY MOUTH EVERY DAY 07/23/23  Yes [name], MD  venlafaxine  (EFFEXOR ) 75 MG tablet Take 75 mg by mouth in the morning.   Yes [provider]  Venlafaxine  HCl 150 MG TB24 Take 150 mg by mouth in the morning.   Yes [provider]  loperamide  (IMODIUM ) 2 MG capsule Take 1 capsule (2 mg total) by mouth 2 (two) times daily. Patient not taking: Reported on 03/06/2024 01/14/24   Sheldon Standing, MD  loperamide  (IMODIUM ) 2 MG capsule Take 1-2 capsules (2-4 mg total) by mouth every 6 (six) hours as needed for diarrhea or loose stools (Use if >2 BM every 8 hours). Patient not taking: Reported on 03/06/2024 01/14/24   Sheldon Standing, MD  mirtazapine  (REMERON ) 30 MG tablet Take 1 tablet (30 mg total) by mouth at bedtime. Patient not taking: Reported  on 03/06/2024 09/16/23   [name], MD  traZODone  (DESYREL ) 100 MG tablet Take 1 tablet (100 mg total) by mouth at bedtime. Patient not taking: Reported on 03/06/2024 07/19/23   Davia Nydia POUR, MD    Current Facility-Administered Medications  Medication Dose Route Frequency Provider Last Rate Last Admin   0.9 %  sodium chloride  infusion (Manually program via Guardrails IV Fluids)   Intravenous Once Khatri, Pardeep, MD       0.9 %  sodium chloride  infusion  250 mL Intravenous PRN Sheldon Standing, MD       acetaminophen  (TYLENOL ) tablet 500 mg  500 mg Oral TID WC & HS Sheldon Standing, MD   500 mg at 03/16/24 0900   alum & mag hydroxide-simeth (MAALOX/MYLANTA) 200-200-20 MG/5ML suspension 30 mL  30 mL Oral Q6H PRN Sheldon Standing, MD   30 mL at 03/09/24 1349   alvimopan  (ENTEREG ) capsule 12 mg  12 mg Oral BID Sheldon Standing, MD   12 mg at 03/16/24 0954   bisacodyl (DULCOLAX) suppository 10 mg  10 mg Rectal Daily Sheldon Standing, MD   10 mg at 03/14/24 1045   busPIRone  (BUSPAR ) tablet 10 mg  10 mg Oral TID Sheldon Standing, MD   10 mg at 03/16/24 9046   carvedilol  (COREG ) tablet 3.125 mg  3.125 mg Oral BID WC Sheldon Standing, MD   3.125 mg at 03/16/24 0900   Chlorhexidine  Gluconate Cloth 2 % PADS 6 each  6 each Topical Daily Sheldon Standing, MD   6 each at 03/16/24 0954   Chlorhexidine  Gluconate Cloth 2 % PADS 6 each  6 each Topical Once Sheldon Standing, MD       cyclobenzaprine (FLEXERIL) tablet 5-10 mg  5-10 mg Oral TID PRN Sheldon Standing, MD       feeding supplement (ENSURE PLUS HIGH PROTEIN) liquid 237 mL  237 mL Oral TID BM Samtani, Jai-Gurmukh, MD   237 mL at 03/16/24 0954   finasteride  (PROSCAR ) tablet 5 mg  5 mg Oral Daily Sheldon Standing, MD   5 mg at 03/16/24 9046   folic acid  (FOLVITE ) tablet 1 mg  1 mg Oral Daily Sheldon Standing, MD   1 mg at 03/16/24 9046   haloperidol  lactate (HALDOL ) injection 2 mg  2 mg Intramuscular Q6H PRN Sheldon Standing, MD   2 mg at 03/16/24 0205   heparin  injection 5,000 Units   5,000 Units Subcutaneous Q8H Gross, Steven, MD   5,000 Units at 03/16/24 9282   iron  sucrose (VENOFER ) 200 mg in sodium chloride  0.9 % 100 mL IVPB  200 mg Intravenous Q Mon Gross, Steven, MD 440 mL/hr at 03/16/24 0951 200 mg at 03/16/24 9048   loperamide  (IMODIUM ) capsule 2 mg  2 mg Oral Q6H PRN Sheldon Standing, MD       magic mouthwash  15 mL Oral QID PRN Sheldon Standing, MD       melatonin tablet 5 mg  5 mg Oral QHS Gross, Steven, MD   5 mg at 03/15/24 2210   menthol  (CEPACOL) lozenge 3 mg  1 lozenge Oral PRN Sheldon Standing, MD       metoprolol  tartrate (LOPRESSOR ) injection 2.5 mg  2.5 mg Intravenous Q6H PRN Sheldon Standing, MD       multivitamin with minerals tablet 1 tablet  1 tablet Oral Daily Samtani, Jai-Gurmukh, MD   1 tablet at 03/16/24 0953   naphazoline-glycerin (CLEAR EYES REDNESS) ophth solution 1-2 drop  1-2 drop Both Eyes QID PRN Sheldon Standing, MD       ondansetron  (ZOFRAN ) tablet 4 mg  4 mg Oral Q6H PRN Sheldon Standing, MD       Or   ondansetron  (ZOFRAN ) injection 4 mg  4 mg Intravenous Q6H PRN Sheldon Standing, MD       pantoprazole  (PROTONIX ) EC tablet 40 mg  40 mg Oral Daily Sheldon Standing, MD   40 mg at 03/16/24 0953   phenol (CHLORASEPTIC) mouth spray 2 spray  2 spray Mouth/Throat PRN Sheldon Standing, MD       polycarbophil (FIBERCON) tablet 625 mg  625 mg Oral BID Sheldon Standing, MD   625 mg at 03/16/24 0953   QUEtiapine (SEROQUEL) tablet 25 mg  25 mg Oral Q0600 Sheldon Standing, MD   25 mg at 03/16/24 0719   QUEtiapine (SEROQUEL) tablet 50 mg  50 mg Oral QHS Gross, Steven, MD   50 mg at 03/15/24 2211   sodium chloride  (OCEAN) 0.65 % nasal spray 1-2 spray  1-2 spray Each Nare Q6H PRN Sheldon Standing, MD       sodium chloride  flush (NS) 0.9 % injection 3 mL  3 mL Intravenous Q12H Gross, Steven, MD   3 mL at 03/16/24 0955   sodium chloride  flush (NS) 0.9 % injection 3 mL  3 mL Intravenous PRN Sheldon Standing, MD       tamsulosin  (FLOMAX ) capsule 0.4 mg  0.4 mg Oral Daily Sheldon Standing, MD   0.4 mg  at 03/16/24 0953   traMADol  (ULTRAM ) tablet 50-100 mg  50-100 mg Oral Q6H PRN Sheldon Standing, MD   50 mg at 03/16/24 [phone]  Allergies as of 03/06/2024 - Review Complete 03/06/2024  Allergen Reaction Noted   Albuterol  Palpitations and Other (See Comments) 06/05/2023    Social History   Socioeconomic History   Marital status: Married    Spouse name: Not on file   Number of children: Not on file   Years of education: Not on file   Highest education level: Not on file  Occupational History   Not on file  Tobacco Use   Smoking status: Never   Smokeless tobacco: Never  Vaping Use   Vaping status: Never Used  Substance and Sexual Activity   Alcohol use: Not Currently   Drug use: No   Sexual activity: Not Currently  Other Topics Concern   Not on file  Social History Narrative   Not on file   Social Drivers of Health   Financial Resource Strain: Low Risk  (10/10/2023)   Overall Financial Resource Strain (CARDIA)    Difficulty of Paying Living Expenses: Not hard at all  Food Insecurity: Patient Unable To Answer (03/07/2024)   Hunger Vital Sign    Worried About Programme researcher, broadcasting/film/video in the Last Year: Patient unable to answer    Ran Out of Food in the Last Year: Patient unable to answer  Transportation Needs: Patient Unable To Answer (03/07/2024)   PRAPARE - Transportation    Lack of Transportation (Medical): Patient unable to answer    Lack of Transportation (Non-Medical): Patient unable to answer  Physical Activity: Sufficiently Active (10/10/2023)   Exercise Vital Sign    Days of Exercise per Week: 5 days    Minutes of Exercise per Session: 30 min  Stress: No Stress Concern Present (10/10/2023)   [employer] of Occupational Health - Occupational Stress Questionnaire    Feeling of Stress : Not at all  Social Connections: Moderately Integrated (03/07/2024)   Social Connection and Isolation Panel    Frequency of Communication with Friends and Family: Twice a week    Frequency  of Social Gatherings with Friends and Family: Twice a week    Attends Religious Services: Never    Database administrator or Organizations: Yes    Attends Engineer, structural: More than 4 times per year    Marital Status: Married  Recent Concern: Social Connections - Socially Isolated (12/11/2023)   Social Connection and Isolation Panel    Frequency of Communication with Friends and Family: Never    Frequency of Social Gatherings with Friends and Family: Never    Attends Religious Services: Never    Database administrator or Organizations: No    Attends Banker Meetings: Never    Marital Status: Married  Catering manager Violence: Not At Risk (03/07/2024)   Humiliation, Afraid, Rape, and Kick questionnaire    Fear of Current or Ex-Partner: No    Emotionally Abused: No    Physically Abused: No    Sexually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

## 2024-03-16 NOTE — Plan of Care (Signed)
  Problem: Education: Goal: Knowledge of General Education information will improve Description: Including pain rating scale, medication(s)/side effects and non-pharmacologic comfort measures Outcome: Progressing   Problem: Health Behavior/Discharge Planning: Goal: Ability to manage health-related needs will improve Outcome: Progressing   Problem: Clinical Measurements: Goal: Ability to maintain clinical measurements within normal limits will improve Outcome: Progressing Goal: Will remain free from infection Outcome: Progressing Goal: Diagnostic test results will improve Outcome: Progressing Goal: Respiratory complications will improve Outcome: Progressing Goal: Cardiovascular complication will be avoided Outcome: Progressing   Problem: Activity: Goal: Risk for activity intolerance will decrease Outcome: Progressing   Problem: Nutrition: Goal: Adequate nutrition will be maintained Outcome: Progressing   Problem: Coping: Goal: Level of anxiety will decrease Outcome: Progressing   Problem: Elimination: Goal: Will not experience complications related to bowel motility Outcome: Progressing Goal: Will not experience complications related to urinary retention Outcome: Progressing   Problem: Pain Managment: Goal: General experience of comfort will improve and/or be controlled Outcome: Progressing   Problem: Safety: Goal: Ability to remain free from injury will improve Outcome: Progressing   Problem: Skin Integrity: Goal: Risk for impaired skin integrity will decrease Outcome: Progressing   Problem: Safety: Goal: Non-violent Restraint(s) Outcome: Progressing   Problem: Education: Goal: Understanding of discharge needs will improve Outcome: Progressing Goal: Verbalization of understanding of the causes of altered bowel function will improve Outcome: Progressing   Problem: Activity: Goal: Ability to tolerate increased activity will improve Outcome: Progressing    Problem: Bowel/Gastric: Goal: Gastrointestinal status for postoperative course will improve Outcome: Progressing   Problem: Health Behavior/Discharge Planning: Goal: Identification of community resources to assist with postoperative recovery needs will improve Outcome: Progressing   Problem: Nutritional: Goal: Will attain and maintain optimal nutritional status will improve Outcome: Progressing   Problem: Clinical Measurements: Goal: Postoperative complications will be avoided or minimized Outcome: Progressing   Problem: Respiratory: Goal: Respiratory status will improve Outcome: Progressing   Problem: Skin Integrity: Goal: Will show signs of wound healing Outcome: Progressing

## 2024-03-16 NOTE — Progress Notes (Addendum)
 PROGRESS NOTE    Calvin Escobar  FMW:[id number] DOB: February 07, 1951 DOA: 03/06/2024 PCP: [name], MD   Brief Narrative:  This 73 yrs old Male with PMH significant for CKD stage III A, anemia of chronic disease, hypertension, recurrent UTIs, chronic HFrEF, chronic indwelling Foley catheter, bipolar disorder, dementia, hyperlipidemia, OSA on CPAP, Morbid obesity, history of urinary retention, ileostomy in place, protein calorie malnutrition , hospitalization for colovesical fistula /sigmoid diverticulitis for which patient underwent robotic rectosigmoid resection with anastomosis, colovesicle fistula takedown and repair, diverting ileostomy and drainage of pelvic abscess and lysis of adhesions in 8/25.[name] He was re admitted at beginning of September this year for sepsis secondary to UTI/gram negative bacteremia and AKI on CKD stage 3a. He presents this admission from his SNF for AMS x 2 days. Found again to be in AKI with metabolic encephalopathy.  He was admitted for further evaluation.  Assessment & Plan:   Principal Problem:   Acute kidney injury superimposed on stage 3a chronic kidney disease (HCC) with hyperkalemia Active Problems:   Acute encephalopathy   Anemia of chronic disease   Hypertension   Recurrent UTI   Chronic kidney disease, stage 3a (HCC)   Diverticulitis of sigmoid colon s/p robotic colectomy 01/09/2024   Chronic HFrEF (heart failure with reduced ejection fraction) (HCC)   Chronic indwelling Foley catheter   Bipolar affective disorder, depressed (HCC)   Dementia (HCC)   GERD (gastroesophageal reflux disease)   HLD (hyperlipidemia)   OSA on CPAP   Hyperkalemia   Morbid obesity (HCC)   Hydronephrosis, right   History of urinary retention   Ileostomy in place [facility])   Malnutrition of moderate degree   Chronic hyponatremia   Protein-calorie malnutrition, severe  Acute kidney injury on CKD stage IIIA: Likely secondary to large volume losses with high output  ostomy. Reversal of ostomy completed  on 03/12/24. Creatinine improved and back to baseline. AKI resolved.  Hypertension /sinus tachycardia: This seems to be intermittent, he has Foley catheter in place,  he is not confused. Blood pressure is well-controlled, Heart rate Improved. This could be secondary to pain, continue Coreg  twice daily  Sepsis secondary to Klebsiella UTI: Urine culture confirms 100 K Klebsiella. Continue Ancef 2 g every 12 hours. Can change antibiotics to oral at discharge.  Acute Encephalopathy secondary to uremia and polypharmacy: Patient remains back to his baseline mental status as renal functions are improving. Ostomy reversal has been completed. Continue Seroquel 25 mg daily and 50 mg at bedtime for agitation. Continue BuSpar  10 mg TID daily.  Chronic HFr EF: Continue Coreg .  He appears euvolemic.  Chronic indwelling Foley catheter: Patient has history of BPH and urethral stricture s/p dilatation. Urology has evaluated the patient,  they recommended outpatient management options and have signed off.  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 Patient underwent closure ileostomy 10/9. Recommended dysphagia 1 diet. Surgery following. PT/ OT evaluation  Black stools/ Melena: Patient noted to have blackish stools on the sheet. H&H dropped to 7 from 8.6. Transfuse 1 unit PRBC,  monitor H&H. Start pantoprazole  40 mg IV every 12 hours. Hold heparin  subcu GI was consulted and scheduled for EGD tomorrow.   DVT prophylaxis: SCDs Code Status: Full code Family Communication: No family at bedside. Disposition Plan:    Status is: Inpatient Remains inpatient appropriate because: Severity of illness   Consultants:  General Surgery Nephrology   Procedures: Ostomy reversal  Antimicrobials:  Anti-infectives (From admission, onward)  Start     Dose/Rate Route Frequency Ordered Stop   03/12/24 2200   cefoTEtan  (CEFOTAN ) 2 g in sodium chloride  0.9 % 100 mL IVPB  Status:  Discontinued        2 g 200 mL/hr over 30 Minutes Intravenous Every 12 hours 03/12/24 1957 03/12/24 2043   03/12/24 0600  cefoTEtan  (CEFOTAN ) 2 g in sodium chloride  0.9 % 100 mL IVPB        2 g 200 mL/hr over 30 Minutes Intravenous On call to O.R. 03/11/24 1206 03/12/24 1550   03/10/24 1315  ceFAZolin (ANCEF) IVPB 2g/100 mL premix        2 g 200 mL/hr over 30 Minutes Intravenous Every 12 hours 03/10/24 1221 03/14/24 1759   03/07/24 1800  cefTRIAXone  (ROCEPHIN ) 2 g in sodium chloride  0.9 % 100 mL IVPB  Status:  Discontinued        2 g 200 mL/hr over 30 Minutes Intravenous Every 24 hours 03/07/24 0842 03/10/24 1221   03/07/24 0930  azithromycin (ZITHROMAX) tablet 500 mg  Status:  Discontinued        500 mg Oral Daily 03/07/24 0842 03/09/24 1046   03/06/24 1800  cefTRIAXone  (ROCEPHIN ) 1 g in sodium chloride  0.9 % 100 mL IVPB  Status:  Discontinued        1 g 200 mL/hr over 30 Minutes Intravenous Every 24 hours 03/06/24 1703 03/07/24 0842       Subjective: Patient was seen and examined at bedside. Overnight events noted. Patient reports feeling better, he was sitting on the edge of bed, ready to participate in physical therapy. He is noted to have black stools in the sheet.  H&H dropped slightly.  Objective: Vitals:   03/15/24 1610 03/15/24 2146 03/16/24 0500 03/16/24 0926  BP: 129/75 110/73 125/77 135/85  Pulse: 96 (!) 101 88 100  Resp: 17 18 20 18   Temp: (!) 97.4 F (36.3 C) 97.7 F (36.5 C) 98.3 F (36.8 C) 98.5 F (36.9 C)  TempSrc:   Oral Oral  SpO2: 99% (!) 88%  100%  Weight:   91.1 kg   Height:        Intake/Output Summary (Last 24 hours) at 03/16/2024 1330 Last data filed at 03/16/2024 1329 Gross per 24 hour  Intake 720 ml  Output 2103 ml  Net -1383 ml   Filed Weights   03/14/24 0500 03/15/24 0500 03/16/24 0500  Weight: 91 kg 91.4 kg 91.1 kg    Examination:  General exam: Appears calm  and comfortable, not in any acute distress. Respiratory system: CTA Bilaterally. Respiratory effort normal. RR 15 Cardiovascular system: S1 & S2 heard, RRR. No JVD, murmurs, rubs, gallops or clicks.  Gastrointestinal system: Abdomen is non distended, soft and Mildly tender.  Dressing noted, Normal bowel sounds heard. Central nervous system: Alert and oriented x 3. No focal neurological deficits. Extremities: No edema, no cyanosis, no clubbing. Skin: No rashes, lesions or ulcers Psychiatry: Judgement and insight appear normal. Mood & affect appropriate.     Data Reviewed: I have personally reviewed following labs and imaging studies  CBC: Recent Labs  Lab 03/12/24 0240 03/13/24 0302 03/14/24 0757 03/15/24 0927 03/16/24 0727  WBC 9.3 12.2* 10.3 8.6 8.1  NEUTROABS 5.8 9.2* 7.9* 6.2 5.3  HGB 9.4* 8.7* 8.1* 7.2* 7.0*  HCT 30.6* 28.8* 27.7* 24.8* 23.6*  MCV 87.2 88.3 89.4 89.2 89.1  PLT 228 185 156 158 193   Basic Metabolic Panel: Recent Labs  Lab 03/10/24 0403 03/11/24 0259  03/12/24 0240 03/13/24 0302 03/14/24 0757 03/15/24 0524 03/16/24 0727  NA 138 139 140 136 139 143 142  K 4.2 4.4 4.4 4.2 3.9 3.9 4.1  CL 104 102 104 106 108 108 109  CO2 25 24 23  21* 21* 18* 21*  GLUCOSE 115* 107* 113* 123* 115* 100* 108*  BUN 31* 25* 18 17 17 21 22   CREATININE 2.61* 2.40* 2.16* 2.01* 1.95* 1.82* 1.87*  CALCIUM  8.5* 8.5* 8.4* 8.0* 8.1* 8.0* 8.4*  MG 2.3 2.1 1.8 1.6*  --   --   --   PHOS 3.3  --   --  4.3  --   --   --    GFR: Estimated Creatinine Clearance: 37.5 mL/min (A) (by C-G formula based on SCr of 1.87 mg/dL (H)). Liver Function Tests: Recent Labs  Lab 03/10/24 0403 03/11/24 0259 03/12/24 0240  AST 19 18 18   ALT 9 9 8   ALKPHOS 56 58 59  BILITOT 0.5 0.5 0.4  PROT 6.5 6.1* 6.4*  ALBUMIN  2.7* 2.5* 2.6*   No results for input(s): LIPASE, AMYLASE in the last 168 hours. No results for input(s): AMMONIA in the last 168 hours.  Coagulation Profile: No results for  input(s): INR, PROTIME in the last 168 hours. Cardiac Enzymes: No results for input(s): CKTOTAL, CKMB, CKMBINDEX, TROPONINI in the last 168 hours. BNP (last 3 results) No results for input(s): PROBNP in the last 8760 hours. HbA1C: No results for input(s): HGBA1C in the last 72 hours. CBG: Recent Labs  Lab 03/14/24 0605 03/15/24 0820  GLUCAP 89 97   Lipid Profile: No results for input(s): CHOL, HDL, LDLCALC, TRIG, CHOLHDL, LDLDIRECT in the last 72 hours. Thyroid  Function Tests: No results for input(s): TSH, T4TOTAL, FREET4, T3FREE, THYROIDAB in the last 72 hours. Anemia Panel: Recent Labs    03/16/24 0727  VITAMINB12 298  FOLATE >20.0  FERRITIN 635*  TIBC 143*  IRON  37*  RETICCTPCT 2.7   Sepsis Labs: No results for input(s): PROCALCITON, LATICACIDVEN in the last 168 hours.   Recent Results (from the past 240 hours)  Culture, blood (Routine X 2) w Reflex to ID Panel     Status: None   Collection Time: 03/06/24  1:31 PM   Specimen: BLOOD LEFT FOREARM  Result Value Ref Range Status   Specimen Description   Final    BLOOD LEFT FOREARM Performed at [facility], [address], [place], [place] [address]    Special Requests   Final    BOTTLES DRAWN AEROBIC AND ANAEROBIC Blood Culture adequate volume Performed at [facility], [address] [place], [place] [address]    Culture   Final    NO GROWTH 5 DAYS Performed at [facility], [address], [place], [place] [address]    Report Status 03/11/2024 FINAL  Final  Urine Culture     Status: Abnormal   Collection Time: 03/06/24  3:13 PM   Specimen: Urine, Random  Result Value Ref Range Status   Specimen Description   Final    URINE, RANDOM Performed at [facility], [address] [place], [place] [address]    Special Requests   Final    NONE Reflexed from [id number] Performed at [facility], [address] [place], [place] [address]    Culture >=100,000 COLONIES/mL KLEBSIELLA PNEUMONIAE (A)  Final   Report Status 03/08/2024 FINAL  Final   Organism ID, Bacteria KLEBSIELLA PNEUMONIAE (A)  Final  Susceptibility   Klebsiella pneumoniae - MIC*    AMPICILLIN  RESISTANT Resistant     CEFAZOLIN (URINE) Value in next row Sensitive      2 SENSITIVEThis is a modified FDA-approved test that has been validated and its performance characteristics determined by the reporting laboratory.  This laboratory is certified under the Clinical Laboratory Improvement Amendments CLIA as qualified to perform high complexity clinical laboratory testing.    CEFEPIME  Value in next row Sensitive      2 SENSITIVEThis is a modified FDA-approved test that has been validated and its performance characteristics determined by the reporting laboratory.  This laboratory is certified under the Clinical Laboratory Improvement Amendments CLIA as qualified to perform high complexity clinical laboratory testing.    ERTAPENEM Value in next row Sensitive      2 SENSITIVEThis is a modified FDA-approved test that has been validated and its performance characteristics determined by the reporting laboratory.  This laboratory is certified under the Clinical Laboratory Improvement Amendments CLIA as qualified to perform high complexity clinical laboratory testing.    CEFTRIAXONE  Value in next row Sensitive      2 SENSITIVEThis is a modified FDA-approved test that has been validated and its performance characteristics determined by the reporting laboratory.  This laboratory is certified under the Clinical Laboratory Improvement Amendments CLIA as qualified to perform high complexity clinical laboratory testing.    CIPROFLOXACIN Value in next row Sensitive      2 SENSITIVEThis is a modified FDA-approved test that has been validated and its performance characteristics determined by the reporting laboratory.  This laboratory is certified under  the Clinical Laboratory Improvement Amendments CLIA as qualified to perform high complexity clinical laboratory testing.    GENTAMICIN Value in next row Sensitive      2 SENSITIVEThis is a modified FDA-approved test that has been validated and its performance characteristics determined by the reporting laboratory.  This laboratory is certified under the Clinical Laboratory Improvement Amendments CLIA as qualified to perform high complexity clinical laboratory testing.    NITROFURANTOIN Value in next row Intermediate      2 SENSITIVEThis is a modified FDA-approved test that has been validated and its performance characteristics determined by the reporting laboratory.  This laboratory is certified under the Clinical Laboratory Improvement Amendments CLIA as qualified to perform high complexity clinical laboratory testing.    TRIMETH/SULFA Value in next row Sensitive      2 SENSITIVEThis is a modified FDA-approved test that has been validated and its performance characteristics determined by the reporting laboratory.  This laboratory is certified under the Clinical Laboratory Improvement Amendments CLIA as qualified to perform high complexity clinical laboratory testing.    AMPICILLIN /SULBACTAM Value in next row Sensitive      2 SENSITIVEThis is a modified FDA-approved test that has been validated and its performance characteristics determined by the reporting laboratory.  This laboratory is certified under the Clinical Laboratory Improvement Amendments CLIA as qualified to perform high complexity clinical laboratory testing.    PIP/TAZO Value in next row Sensitive      <=4 SENSITIVEThis is a modified FDA-approved test that has been validated and its performance characteristics determined by the reporting laboratory.  This laboratory is certified under the Clinical Laboratory Improvement Amendments CLIA as qualified to perform high complexity clinical laboratory testing.    MEROPENEM Value in next row  Sensitive      <=4 SENSITIVEThis is a modified FDA-approved test that has been validated and its performance characteristics determined by the reporting  laboratory.  This laboratory is certified under the Clinical Laboratory Improvement Amendments CLIA as qualified to perform high complexity clinical laboratory testing.    * >=100,000 COLONIES/mL KLEBSIELLA PNEUMONIAE    Radiology Studies: No results found.  Scheduled Meds:  sodium chloride    Intravenous Once   acetaminophen   500 mg Oral TID WC & HS   alvimopan   12 mg Oral BID   bisacodyl  10 mg Rectal Daily   busPIRone   10 mg Oral TID   carvedilol   3.125 mg Oral BID WC   Chlorhexidine  Gluconate Cloth  6 each Topical Daily   Chlorhexidine  Gluconate Cloth  6 each Topical Once   feeding supplement  237 mL Oral TID BM   finasteride   5 mg Oral Daily   folic acid   1 mg Oral Daily   melatonin  5 mg Oral QHS   multivitamin with minerals  1 tablet Oral Daily   pantoprazole  (PROTONIX ) IV  40 mg Intravenous Q12H   polycarbophil  625 mg Oral BID   QUEtiapine  25 mg Oral Q0600   QUEtiapine  50 mg Oral QHS   sodium chloride  flush  3 mL Intravenous Q12H   tamsulosin   0.4 mg Oral Daily   Continuous Infusions:  sodium chloride      iron  sucrose 200 mg (03/16/24 0951)     LOS: 10 days    Time spent: 50 mins    Darcel Dawley, MD [facility]   If 7PM-7AM, please contact night-coverage

## 2024-03-16 NOTE — H&P (View-Only) (Signed)
 Consultation Note   Referring Provider:   [facility] PCP: [name], MD Primary Gastroenterologist:   Victory Brand, MD      Reason for Consultation:  Black stool, anemia DOA: 03/06/2024         Hospital Day: 11    Attending physician's note  I personally saw the patient and performed a substantive portion of the medical decision making process for this encounter (including a complete performance of the key components : MDM, Hx and Exam), in conjunction with the APP.  I agree with the APP's note, impression, and  the management plan for the number and complexity of problems addressed at the encounter for the patient and take responsibility for that plan with its inherent risk of complications, morbidity, or mortality with additional input as follows.     73 year old male with history of colovesical fistula with abscess, UTI, rectosigmoid stricture status post LAR with anastomosis, takedown of diverting loop ileostomy on 10 /9 with worsening anemia and dark stool concerning for acute upper GI bleed  On exam abdomen soft, no tenderness or distention  Will plan for EGD for evaluation, exclude erosive gastritis, duodenitis, gastroduodenal ulcer or erosions N.p.o. after midnight Monitor hemoglobin and transfuse if below 7 PPI twice daily   K. Veena Hasan Douse , MD [phone]     ASSESSMENT    73 yo male with a history of UTI complicated by colovesical fistula with abscess. Also found to have a rectosigmoid stricture  s/p LAR with anastomosis , colovesical fistula takedown and repair, diverting loop ileostomy, drainage of pelvic abscess followed by ileostomy takedown on 10/9.   Acute on chronic anemia / dark stool Nausea No evidence for iron  deficiency.  Hgb slowly declining over last few days. Stools dark but got 3-4 doses of oral iron  last week. Unlikely post-op bleed. Normal BUN, hemodynamically stable but need to rule out  upper GI bleed. He is up to date on colonoscopy ( done in July 2025)    History of recurrent C-diff infection   CKD3  HFrEF  OSA  Chronic anxiety  Dementia  See PMH for any additional medical history  / medical problems  Principal Problem:   Acute kidney injury superimposed on stage 3a chronic kidney disease (HCC) with hyperkalemia Active Problems:   Bipolar affective disorder, depressed (HCC)   Dementia (HCC)   HLD (hyperlipidemia)   Hyperkalemia   Morbid obesity (HCC)   Hypertension   Chronic kidney disease, stage 3a (HCC)   Anemia of chronic disease   Recurrent UTI   GERD (gastroesophageal reflux disease)   Hydronephrosis, right   Chronic HFrEF (heart failure with reduced ejection fraction) (HCC)   History of urinary retention   Chronic indwelling Foley catheter   Ileostomy in place [facility])   Malnutrition of moderate degree   OSA on CPAP   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  --Schedule for EGD to be done tomorrow. Patient has dementia. He asks me to consent wife so I will contact her.   Addendum: I spoke  with wife [name]. The risks and benefits of EGD with possible biopsies were discussed with [name] who agrees to proceed.    HPI   In December 2024 patient was hospitalized with sepsis / UTI / emphysematous cystitis and concern for colovesical fistula. Conservative management recommended. Reportedly had cystoscopy  that didn't show evidence for fistula.  Rehospitalized in January 2025 severe sepsis / UTI / anemia. Treated with antibiotics. Then in February 2025 he was hospitalized with C-diff as well as recurrent UTI sepsis. Saw [facility] in April and plan was for resection following a pre-op colonoscopy. Saw Dr. Legrand in office to get this arranged but prior  to procedure being done he ended up back in hospital in July with COVID 19 / fever / candidemia. While inpatient we proceeded with pre-op colonoscopy which showed severe luminal narrowing in the distal sigmoid colon with severe diverticular disease, suspected site of the fistula.   On 01/09/24 he underwent LAR with anastomosis , colovesical fistula takedown and repair, diverting loop ileostomy, drainage of pelvic abscess, LOA.   On 03/12/24 he underwent ileostomy takedown   Hgb has continuously declined from since ileostomy from 9.4 to 7.0. Got IV  on 10/6. Took oral iron  10/6 thru 10/9. Has been on SQ heparin . Today had a dark BM per nurse  Patient complains of nausea, started this admission. He complains of abdominal pain ( points to area of surgical incision).     Pertinent GI Studies   12/05/23 Colonoscopy - Diverticulosis in the entire examined colon. - Severe luminal narrowing in the distal sigmoid colon with severe diverticular disease, I suspect site of the fistula in that area. - Internal hemorrhoids. - The examination was otherwise normal. - No polyps.    Labs and Imaging:  No results for input(s): PROT, ALBUMIN , AST, ALT, ALKPHOS, BILITOT, BILIDIR, IBILI in the last 72 hours. Recent Labs    03/14/24 0757 03/15/24 0927 03/16/24 0727  WBC 10.3 8.6 8.1  HGB 8.1* 7.2* 7.0*  HCT 27.7* 24.8* 23.6*  MCV 89.4 89.2 89.1  PLT 156 158 193   Recent Labs    03/14/24 0757 03/15/24 0524 03/16/24 0727  NA 139 143 142  K 3.9 3.9 4.1  CL 108 108 109  CO2 21* 18* 21*  GLUCOSE 115* 100* 108*  BUN 17 21 22   CREATININE 1.95* 1.82* 1.87*  CALCIUM  8.1* 8.0* 8.4*     CT PELVIS WO CONTRAST CLINICAL DATA:  History of colovesical fistula repair. Rule out anastomotic leak or recurrent fistula.  EXAM: CT CYSTOGRAM (CT PELVIS WITH CONTRAST)  TECHNIQUE: Multidetector CT imaging through the pelvis was performed after dilute contrast had been introduced into the bladder  for the purposes of performing CT cystography.  RADIATION DOSE REDUCTION: This exam was performed according to the departmental dose-optimization program which includes automated exposure control, adjustment of the mA and/or kV according to patient size and/or use of iterative reconstruction technique.  CONTRAST:  OMNIPAQUE  IOHEXOL  300 MG/ML  SOLN  COMPARISON:  03/07/2024  FINDINGS: Initially, a cystogram was performed with pre and post bladder contrast imaging. Subsequently, after draining the bladder of contrast, rectal contrast was administered.  Urinary Tract: The mild left-sided hydronephrosis has resolved. The mild-to-moderate right-sided hydroureteronephrosis is improved.  Foley catheter within the bladder.  Intra bladder contrast demonstrates multiple small saccules. Persistent bladder wall thickening. No contrast identified outside the bladder or within the adjacent bowel.  Bowel: Surgical sutures within the distal sigmoid. Colonic contrast opacification is moderate within the rectum  and distal sigmoid. More proximal contrast not present. The patient removed the enema tip prior to more proximal contrast opacification.  No anastomotic leak identified.  Scattered colonic diverticula.  Diverting right lower quadrant loop ileostomy.  Vascular/Lymphatic: Aortic Atherosclerosis. No pelvic sidewall adenopathy.  Reproductive:  Mild prostatomegaly.  Other:  No extraluminal gas or free pelvic fluid.  Musculoskeletal: Presumed bone island in the right ischium.  IMPRESSION: 1. Performance of a cystogram followed by a pelvic CT with rectal contrast administration demonstrates no evidence of contrast extravasation or colovesical fistula. 2. Prostatomegaly with bladder wall thickening and saccules, suggesting outlet obstruction. 3. Resolved left and improved right-sided hydroureteronephrosis.  Electronically Signed   By: Rockey Kilts M.D.   On: 03/10/2024  17:02     Past Medical History:  Diagnosis Date   Acute cystitis 05/13/2023   Acute hyponatremia 05/13/2023   Acute kidney injury superimposed on chronic kidney disease 05/13/2023   CLINICAL CONTEXT: 73 year old male with stage 3 CKD, colovesical fistula, recurrent UTIs, and recent candidal UTI. Recent renal ultrasound (09/28/2023) confirmed mild left hydronephrosis and prostatic mass protruding into bladder base. Iron  studies consistent with anemia of chronic disease (iron  32, TIBC 238, ferritin 420).   DIAGNOSTIC ASSESSMENT: ? 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 History of GI bleeding (07/2023). Currently on ferrous sulfate  supplementation.   DIAGNOSTIC ASSESSMENT: ? Primary etiology (70-80% probability): Anemia of chronic disease/inflammation ? 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  EF 35 to 40% on echo 04/2021   Dementia (HCC)    Depression    Diarrhea 05/13/2023   E coli bacteremia 05/14/2023   High anion gap metabolic acidosis 07/13/2023   History of Clostridioides difficile colitis 08/15/2023   Associated with colovesical fistula? Recurrent  antibiotic(s) requirement     Hyperkalemia    Associated with losartan   Advised patient low potassium diet 08/2022             Lab Results      Component    Value    Date/Time           K    4.0    12/27/2023 01:20 PM           K    4.4    12/18/2023 05:07 AM           K    4.4    12/17/2023 06:01 AM           K    4.7    12/16/2023 06:15 AM           K    4.9    12/15/2023 08:21 AM           K    4.4  12/14/2023 06:00 AM           K    5.0    Hyperlipidemia    Hypertension    Leukocytosis 12/11/2023   Lab Results      Component    Value    Date/Time           WBC    6.2    12/27/2023 01:20 PM           WBC    11.6 (H)    12/18/2023 05:07 AM           WBC    9.6    12/17/2023 06:01 AM           WBC    9.4    12/16/2023 06:15 AM           WBC    12.7 (H)    12/15/2023 08:21 AM           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  Work-up with cardiology in January 2021.     catheterization    Assessment:     1. Normal coronaries  2. NICM with EF 25-30%  3. Normal hemodynamics  .     Pneumonia    PUD (peptic ulcer disease) 01/12/2015   Formatting of this note might be different from the original.  Endoscopy esophageal stricture 2016     Septic shock from UTI 06/02/2023   Severe sepsis (HCC) 06/03/2023   SIRS (systemic inflammatory response syndrome) (HCC) 12/11/2023   SKIN CANCER, HX OF 05/10/2007   Facial left check and forehead and r shoulder  F/w derm   Syncope 05/13/2023   Thrombocytopenia 06/02/2023   Lab Results      Component    Value    Date           PLT    301    07/19/2023           PLT    324    07/18/2023           PLT    268    07/16/2023           PLT    207    07/14/2023           PLT    228    07/13/2023             Lab Results      Component    Value    Date           ESRSEDRATE    60 (H)    05/08/2023     No results found for: CRP          Lab Results       Component    Value       Urinary catheter complication 06/24/2023   Urinary dribbling 03/14/2022   Urinary incontinence 11/21/2022      Urinary Incontinence: They are wearing disposable diapers daily due to urinary incontinence, primarily nocturnal, and are currently on Tamsulosin  (Flomax ). We will refer them to Urology for further evaluation and potential treatment options and continue Tamsulosin  until they are seen by Urology.     Urinary tract infection without hematuria 06/02/2023   UTI (urinary tract infection) 07/01/2023    Past Surgical History:  Procedure Laterality Date  COLECTOMY, SIGMOID, ROBOT-ASSISTED N/A 01/09/2024   Procedure: ROBOTIC LOW ANTERIOR RESECTION WITH ANASTOMOSIS, DRAINAGE OF PELVIC ABSCESS, COLOVESICAL FISTULA TAKEDOWN AND REPAIR, INTRAOPERATIVE ASSESSMENT OF TISSUE PERFUSION USING ICG DYE, AND BILATERAL TAP BLOCK;  Surgeon: Sheldon Standing, MD;  Location: [facility];  Service: General;  Laterality: N/A;  ROBOTIC RESECTION OF COLON RECTOSIGMOID   COLONOSCOPY     COLONOSCOPY N/A 12/05/2023   Procedure: COLONOSCOPY;  Surgeon: Leigh Standing SQUIBB, MD;  Location[facility];  Service: Gastroenterology;  Laterality: N/A;   CYSTOSCOPY WITH INDOCYANINE GREEN  IMAGING (ICG) N/A 01/09/2024   Procedure: CYSTOSCOPY WITH INDOCYANINE GREEN  IMAGING (ICG);  Surgeon: Watt Rush, MD;  Location: [facility];  Service: Urology;  Laterality: N/A;  CYSTO WITH FIREFLY INJECTION   FLEXIBLE SIGMOIDOSCOPY N/A 01/09/2024   Procedure: SIGMOIDOSCOPY, FLEXIBLE;  Surgeon: Sheldon Standing, MD;  Location: [facility];  Service: General;  Laterality: N/A;   ILEOSTOMY CLOSURE N/A 03/12/2024   Procedure: CLOSURE, ILEOSTOMY;  Surgeon: Sheldon Standing, MD;  Location: [facility];  Service: General;  Laterality: N/A;  GEN WITH ERAS PATHWAY   LAPAROSCOPIC LOOP COLOSTOMY N/A 01/09/2024   Procedure: CREATION OF DIVERTING LOOP ILEOSTOMY;  Surgeon: Sheldon Standing, MD;  Location: [facility];  Service: General;  Laterality: N/A;   RECTAL EXAM  UNDER ANESTHESIA N/A 03/12/2024   Procedure: EXAM UNDER ANESTHESIA, RECTUM;  Surgeon: Sheldon Standing, MD;  Location: [facility];  Service: General;  Laterality: N/A;   RIGHT/LEFT HEART CATH AND CORONARY ANGIOGRAPHY N/A 07/03/2019   Procedure: RIGHT/LEFT HEART CATH AND CORONARY ANGIOGRAPHY;  Surgeon: Cherrie Toribio SAUNDERS, MD;  Location: [facility] IN[facility];  Service: Cardiovascular;  Laterality: N/A;   ROBOTIC ASSISTED LAPAROSCOPIC LYSIS OF ADHESION  01/09/2024   Procedure: LYSIS, ADHESIONS, ROBOT-ASSISTED, LAPAROSCOPIC;  Surgeon: Sheldon Standing, MD;  Location: [facility];  Service: General;;    Family History  Problem Relation Age of Onset   Hypertension Mother    Cancer Father    Bone cancer Sister     Prior to Admission medications   Medication Sig Start Date End Date Taking? Authorizing Provider  ARIPiprazole  (ABILIFY ) 2 MG tablet Take 1 tablet (2 mg total) by mouth in the morning. 08/22/23  Yes [name], MD  busPIRone  (BUSPAR ) 10 MG tablet Take 1 tablet (10 mg total) by mouth 3 (three) times daily. 09/16/23  Yes [name], MD  carvedilol  (COREG ) 6.25 MG tablet Take 6.25 mg by mouth in the morning and at bedtime.   Yes [provider]  diphenoxylate-atropine (LOMOTIL) 2.5-0.025 MG tablet Take 1 tablet by mouth in the morning and at bedtime.   Yes [provider]  ferrous sulfate  325 (65 FE) MG tablet Take 1 tablet (325 mg total) by mouth 2 (two) times daily with a meal. 01/14/24  Yes Gross, Standing, MD  finasteride  (PROSCAR ) 5 MG tablet Take 5 mg by mouth daily. 11/06/23  Yes [provider]  folic acid  (FOLVITE ) 1 MG tablet Take 1 mg by mouth daily. 10/21/23  Yes [provider]  gabapentin  (NEURONTIN ) 300 MG capsule Take 300 mg by mouth at bedtime. 01/01/24  Yes [provider]  HYDROcodone -acetaminophen  (NORCO/VICODIN) 5-325 MG tablet Take 1 tablet by mouth every 6 (six) hours as needed for moderate pain (pain score 4-6) or severe pain (pain score  7-10). 02/09/24  Yes Adhikari, Ivonne, MD  loperamide  (IMODIUM  A-D) 2 MG tablet Take 6 mg by mouth 4 (four) times daily.   Yes [provider]  omeprazole  (PRILOSEC) 40 MG capsule Take 40  mg by mouth See admin instructions. Take 40 mg by mouth before breakfast- may be opened and sprinkled on applesauce   Yes [provider]  ondansetron  (ZOFRAN ) 4 MG tablet Take 4 mg by mouth every 6 (six) hours as needed for nausea. 01/07/24  Yes [provider]  polycarbophil (FIBERCON) 625 MG tablet Take 1 tablet (625 mg total) by mouth 2 (two) times daily. 01/14/24  Yes Sheldon Standing, MD  pravastatin  (PRAVACHOL ) 20 MG tablet TAKE 1 TABLET BY MOUTH EVERYDAY AT BEDTIME Patient taking differently: Take 20 mg by mouth at bedtime. 01/29/24  Yes [name], MD  primidone  (MYSOLINE ) 50 MG tablet Take 1 tablet (50 mg total) by mouth 2 (two) times daily. Patient taking differently: Take 50 mg by mouth in the morning and at bedtime. 06/11/23  Yes Krishnan, Gokul, MD  sodium bicarbonate  650 MG tablet Take 2 tablets (1,300 mg total) by mouth 2 (two) times daily. 02/09/24  Yes Jillian Buttery, MD  tamsulosin  (FLOMAX ) 0.4 MG CAPS capsule TAKE 1 CAPSULE BY MOUTH EVERY DAY 07/23/23  Yes [name], MD  venlafaxine  (EFFEXOR ) 75 MG tablet Take 75 mg by mouth in the morning.   Yes [provider]  Venlafaxine  HCl 150 MG TB24 Take 150 mg by mouth in the morning.   Yes [provider]  loperamide  (IMODIUM ) 2 MG capsule Take 1 capsule (2 mg total) by mouth 2 (two) times daily. Patient not taking: Reported on 03/06/2024 01/14/24   Sheldon Standing, MD  loperamide  (IMODIUM ) 2 MG capsule Take 1-2 capsules (2-4 mg total) by mouth every 6 (six) hours as needed for diarrhea or loose stools (Use if >2 BM every 8 hours). Patient not taking: Reported on 03/06/2024 01/14/24   Sheldon Standing, MD  mirtazapine  (REMERON ) 30 MG tablet Take 1 tablet (30 mg total) by mouth at bedtime. Patient not taking: Reported  on 03/06/2024 09/16/23   [name], MD  traZODone  (DESYREL ) 100 MG tablet Take 1 tablet (100 mg total) by mouth at bedtime. Patient not taking: Reported on 03/06/2024 07/19/23   Davia Nydia POUR, MD    Current Facility-Administered Medications  Medication Dose Route Frequency Provider Last Rate Last Admin   0.9 %  sodium chloride  infusion (Manually program via Guardrails IV Fluids)   Intravenous Once Khatri, Pardeep, MD       0.9 %  sodium chloride  infusion  250 mL Intravenous PRN Sheldon Standing, MD       acetaminophen  (TYLENOL ) tablet 500 mg  500 mg Oral TID WC & HS Sheldon Standing, MD   500 mg at 03/16/24 0900   alum & mag hydroxide-simeth (MAALOX/MYLANTA) 200-200-20 MG/5ML suspension 30 mL  30 mL Oral Q6H PRN Sheldon Standing, MD   30 mL at 03/09/24 1349   alvimopan  (ENTEREG ) capsule 12 mg  12 mg Oral BID Sheldon Standing, MD   12 mg at 03/16/24 0954   bisacodyl (DULCOLAX) suppository 10 mg  10 mg Rectal Daily Sheldon Standing, MD   10 mg at 03/14/24 1045   busPIRone  (BUSPAR ) tablet 10 mg  10 mg Oral TID Sheldon Standing, MD   10 mg at 03/16/24 9046   carvedilol  (COREG ) tablet 3.125 mg  3.125 mg Oral BID WC Sheldon Standing, MD   3.125 mg at 03/16/24 0900   Chlorhexidine  Gluconate Cloth 2 % PADS 6 each  6 each Topical Daily Sheldon Standing, MD   6 each at 03/16/24 0954   Chlorhexidine  Gluconate Cloth 2 % PADS 6 each  6 each Topical Once Sheldon Standing, MD       cyclobenzaprine (FLEXERIL) tablet 5-10 mg  5-10 mg Oral TID PRN Sheldon Standing, MD       feeding supplement (ENSURE PLUS HIGH PROTEIN) liquid 237 mL  237 mL Oral TID BM Samtani, Jai-Gurmukh, MD   237 mL at 03/16/24 0954   finasteride  (PROSCAR ) tablet 5 mg  5 mg Oral Daily Sheldon Standing, MD   5 mg at 03/16/24 9046   folic acid  (FOLVITE ) tablet 1 mg  1 mg Oral Daily Sheldon Standing, MD   1 mg at 03/16/24 9046   haloperidol  lactate (HALDOL ) injection 2 mg  2 mg Intramuscular Q6H PRN Sheldon Standing, MD   2 mg at 03/16/24 0205   heparin  injection 5,000 Units   5,000 Units Subcutaneous Q8H Gross, Steven, MD   5,000 Units at 03/16/24 9282   iron  sucrose (VENOFER ) 200 mg in sodium chloride  0.9 % 100 mL IVPB  200 mg Intravenous Q Mon Gross, Steven, MD 440 mL/hr at 03/16/24 0951 200 mg at 03/16/24 9048   loperamide  (IMODIUM ) capsule 2 mg  2 mg Oral Q6H PRN Sheldon Standing, MD       magic mouthwash  15 mL Oral QID PRN Sheldon Standing, MD       melatonin tablet 5 mg  5 mg Oral QHS Gross, Steven, MD   5 mg at 03/15/24 2210   menthol  (CEPACOL) lozenge 3 mg  1 lozenge Oral PRN Sheldon Standing, MD       metoprolol  tartrate (LOPRESSOR ) injection 2.5 mg  2.5 mg Intravenous Q6H PRN Sheldon Standing, MD       multivitamin with minerals tablet 1 tablet  1 tablet Oral Daily Samtani, Jai-Gurmukh, MD   1 tablet at 03/16/24 0953   naphazoline-glycerin (CLEAR EYES REDNESS) ophth solution 1-2 drop  1-2 drop Both Eyes QID PRN Sheldon Standing, MD       ondansetron  (ZOFRAN ) tablet 4 mg  4 mg Oral Q6H PRN Sheldon Standing, MD       Or   ondansetron  (ZOFRAN ) injection 4 mg  4 mg Intravenous Q6H PRN Sheldon Standing, MD       pantoprazole  (PROTONIX ) EC tablet 40 mg  40 mg Oral Daily Sheldon Standing, MD   40 mg at 03/16/24 0953   phenol (CHLORASEPTIC) mouth spray 2 spray  2 spray Mouth/Throat PRN Sheldon Standing, MD       polycarbophil (FIBERCON) tablet 625 mg  625 mg Oral BID Sheldon Standing, MD   625 mg at 03/16/24 0953   QUEtiapine (SEROQUEL) tablet 25 mg  25 mg Oral Q0600 Sheldon Standing, MD   25 mg at 03/16/24 0719   QUEtiapine (SEROQUEL) tablet 50 mg  50 mg Oral QHS Gross, Steven, MD   50 mg at 03/15/24 2211   sodium chloride  (OCEAN) 0.65 % nasal spray 1-2 spray  1-2 spray Each Nare Q6H PRN Sheldon Standing, MD       sodium chloride  flush (NS) 0.9 % injection 3 mL  3 mL Intravenous Q12H Gross, Steven, MD   3 mL at 03/16/24 0955   sodium chloride  flush (NS) 0.9 % injection 3 mL  3 mL Intravenous PRN Sheldon Standing, MD       tamsulosin  (FLOMAX ) capsule 0.4 mg  0.4 mg Oral Daily Sheldon Standing, MD   0.4 mg  at 03/16/24 0953   traMADol  (ULTRAM ) tablet 50-100 mg  50-100 mg Oral Q6H PRN Sheldon Standing, MD   50 mg at 03/16/24 [phone]  Allergies as of 03/06/2024 - Review Complete 03/06/2024  Allergen Reaction Noted   Albuterol  Palpitations and Other (See Comments) 06/05/2023    Social History   Socioeconomic History   Marital status: Married    Spouse name: Not on file   Number of children: Not on file   Years of education: Not on file   Highest education level: Not on file  Occupational History   Not on file  Tobacco Use   Smoking status: Never   Smokeless tobacco: Never  Vaping Use   Vaping status: Never Used  Substance and Sexual Activity   Alcohol use: Not Currently   Drug use: No   Sexual activity: Not Currently  Other Topics Concern   Not on file  Social History Narrative   Not on file   Social Drivers of Health   Financial Resource Strain: Low Risk  (10/10/2023)   Overall Financial Resource Strain (CARDIA)    Difficulty of Paying Living Expenses: Not hard at all  Food Insecurity: Patient Unable To Answer (03/07/2024)   Hunger Vital Sign    Worried About Programme researcher, broadcasting/film/video in the Last Year: Patient unable to answer    Ran Out of Food in the Last Year: Patient unable to answer  Transportation Needs: Patient Unable To Answer (03/07/2024)   PRAPARE - Transportation    Lack of Transportation (Medical): Patient unable to answer    Lack of Transportation (Non-Medical): Patient unable to answer  Physical Activity: Sufficiently Active (10/10/2023)   Exercise Vital Sign    Days of Exercise per Week: 5 days    Minutes of Exercise per Session: 30 min  Stress: No Stress Concern Present (10/10/2023)   [employer] of Occupational Health - Occupational Stress Questionnaire    Feeling of Stress : Not at all  Social Connections: Moderately Integrated (03/07/2024)   Social Connection and Isolation Panel    Frequency of Communication with Friends and Family: Twice a week    Frequency  of Social Gatherings with Friends and Family: Twice a week    Attends Religious Services: Never    Database administrator or Organizations: Yes    Attends Engineer, structural: More than 4 times per year    Marital Status: Married  Recent Concern: Social Connections - Socially Isolated (12/11/2023)   Social Connection and Isolation Panel    Frequency of Communication with Friends and Family: Never    Frequency of Social Gatherings with Friends and Family: Never    Attends Religious Services: Never    Database administrator or Organizations: No    Attends Banker Meetings: Never    Marital Status: Married  Catering manager Violence: Not At Risk (03/07/2024)   Humiliation, Afraid, Rape, and Kick questionnaire    Fear of Current or Ex-Partner: No    Emotionally Abused: No    Physically Abused: No    Sexually Abused: No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

## 2024-03-16 NOTE — Progress Notes (Addendum)
 Calorie Count Note  Pt sleepy on visit. Did not have breakfast but did want an Ensure. RD provided pt with an Ensure and notified nursing. Per calorie count pt does not seem to consume solid food but will drink liquids. Pt doing poorly with intake meeting <50% of his estimated calorie and protein needs. Has been refusing meals and is intermittently lethargic which causes him to sleep through meals. Would recommend NGT for tube feeding however pt at high risk of pulling it out with history of dementia.   Black stools noted today, GI planning for EGD tomorrow.   48  hour calorie count ordered.  Diet: Dysphagia 3, thin liquids  Supplements: EPHP, Magic cups   Day 1, 10/11: Breakfast: 75% Ensure, 50% Magic cup (405 kcal, 20 gm protein) Lunch: 50% Ensure (175 kcal, 10 gm protein) Dinner: 75% Ensure (175 kcal, 15 gm protein)  Total intake: 755 kcal (40% of minimum estimated needs)  45 gm protein (47% of minimum estimated needs)  Day 2, 10/12:  Breakfast: Refused  Lunch:  Refused  Dinner: Slept through  Supplements: 2 EPHP (700 kcal, 40 gm protein)  Total intake: 700 kcal (37% of minimum estimated needs)  40 gm protein (42% of minimum estimated needs)  Estimated Nutritional Needs:  Kcal:  1900-2100 kcal Protein:  95-110g Fluid:  1.9-2.0L/day + ostomy output  INTERVENTION:  Encourage PO intake on Dysphagia 3, thin liquids: Nursing to assist with meals Automatic meal trays  Ensure Plus High Protein po TID, each supplement provides 350 kcal and 20 grams of protein Multivitamin with minerals daily Magic cup TID with meals, each supplement provides 290 kcal and 9 grams of protein  Monitor diet advancement and tolerance  Monitor bowel frequency/consistency   Recommend Palliative consult    NUTRITION DIAGNOSIS:  Severe Malnutrition related to chronic illness as evidenced by severe fat depletion, severe muscle depletion.   GOAL:  Patient will meet greater than or equal to 90% of  their needs   MONITOR:  PO intake, Supplement acceptance, Labs, Other (Comment) (diet advancement/tolerance/bowels)  Olivia Kenning, RD Registered Dietitian  See Amion for more information

## 2024-03-16 NOTE — Progress Notes (Signed)
 Mobility Specialist Progress Note:    03/16/24 1355  Mobility  Activity Ambulated with assistance (In hallway)  Level of Assistance Contact guard assist, steadying assist  Assistive Device Front wheel walker  Distance Ambulated (ft) 90 ft  Activity Response Tolerated well  Mobility Referral Yes  Mobility visit 1 Mobility  Mobility Specialist Start Time (ACUTE ONLY) 1330  Mobility Specialist Stop Time (ACUTE ONLY) 1353  Mobility Specialist Time Calculation (min) (ACUTE ONLY) 23 min   Received pt in chair and agreeable to mobility. Pt required MinG for safety. Pt took x1 seated rest breaks d/t fatigue. HR <143 bpm, no c/o. Returned pt to room without fault. Left pt in bed with alarm on. Personal belongings and call light within reach. RN present.  Lavanda Pollack Mobility Specialist  Please contact via [employer] or  Rehab Office [phone]

## 2024-03-16 NOTE — Progress Notes (Signed)
 03/16/2024  Calvin Escobar [id number] [birth year]  CARE TEAM: PCP: [name], MD  Outpatient Care Team: Patient Care Team: [name], MD as PCP - General (Internal Medicine) [name], MD as PCP - Cardiology (Cardiology) [name], MD (Dermatology) [facility] (Behavioral Health) [facility] (Psychiatry) [name], [name] LABOR, MD (Psychiatry) Legrand, Victory LITTIE MOULD, MD as Consulting Physician (Gastroenterology) Sheldon Standing, MD as Consulting Physician (Colon and Rectal Surgery) [name], MD as Consulting Physician (Urology) Daneen Damien BROCKS, NP as Nurse Practitioner (Cardiology)  Inpatient Treatment Team: Treatment Team:  Leotis Bogus, MD Shane Steffan BROCKS, MD Sheldon Standing, MD Bobbette Hurdle, MD Ccs, Md, MD Philis Zada JONETTA, [place] Nyle Darice BRAVO, LPN Fayette Golden DASEN, RN   Problem List:   Principal Problem:   Acute kidney injury superimposed on stage 3a chronic kidney disease [name] Active Problems:   Dementia (HCC)   Ileostomy in place [facility])   Bipolar affective disorder, depressed (HCC)   Chronic HFrEF (heart failure with reduced ejection fraction) (HCC)   HLD (hyperlipidemia)   Hyperkalemia   Morbid obesity (HCC)   Hypertension   Chronic kidney disease, stage 3a (HCC)   Anemia of chronic disease   Recurrent UTI   GERD (gastroesophageal reflux disease)   Hydronephrosis, right   History of urinary retention   Chronic indwelling Foley catheter   Malnutrition of moderate degree   OSA on CPAP   Diverticulitis of sigmoid colon s/p robotic colectomy 01/09/2024   Acute encephalopathy   Chronic hyponatremia   Protein-calorie malnutrition, severe   03/12/2024      POST-OPERATIVE DIAGNOSIS:  LOOP ILEOSTOMY IN PLACE FOR FECAL DIVERSION   PROCEDURE:  CLOSURE, ILEOSTOMY   SURGEON:  [name], MD  OR FINDINGS:  Dense adhesions of loop ileostomy to abdominal wall.  No parastomal  hernia.  It is a side-to-side stapled ileoileal anastomosis close to the terminal ileum.    01/09/2024  POST-OPERATIVE DIAGNOSIS:   COLOVESICAL FISTULA WITH ABSCESS RECTOSIGMOID STRICTURE   PROCEDURE:   -ROBOTIC LOW ANTERIOR RECTOSIGMOID RESECTION (LAR) WITH ANASTOMOSIS -COLOVESICAL FISTULA TAKEDOWN & REPAIR -DIVERTING LOOP ILEOSTOMY (DLI),  =DRAINAGE OF PELVIC ABSCESS -LYSIS OF ADHESIONS x45 MINUTES (30% OF CASE) -INTRAOPERATIVE ASSESSMENT OF TISSUE VASCULAR PERFUSION USING ICG (indocyanine green ) IMMUNOFLUORESCENCE -TRANSVERSUS ABDOMINIS PLANE (TAP) BLOCK - BILATERAL -FLEXIBLE SIGMOIDOSCOPY   SURGEON:  [name], MD  OR FINDINGS:   Patient had very thickened rectosigmoid colon folded and twisted upon itself with stricturing.  Extremely dense adhesions to the left dome of the bladder with 4 x 3 x 3 cm abscess.  Abscess decompressed.  No active persistent opening of the bladder on insufflation.   No obvious metastatic disease on visceral parietal peritoneum or liver.   It is a 31mm EEA anastomosis ( distal descending colon  connected to proximal rectum.)  It rests 17 cm from the anal verge by flexible sigmoidoscopy    Assessment [name] 4 Days Post-Op    Deconditioned but slowly improving  Plan:  ERAS protocol.  Diet as tolerated.  I wrote for dysphagia 3.  He seems more awake.  I would offer a regular diet since he is rather picky on what he wants.  Defer to speech therapy if they have further concerns   Bowel regimen with fiber.  Still remains encephalopathic and occasionally confused, but more up and about.  To help manage him without Haldol  or wrist restraints.  Currently  weaned off sitter needing frequent redirection.  Follow and correct electrolytes.  Keep potassium greater than 4, magnesium  greater than 2, phosphorus greater than 3.  Creatinine relatively stable and nonoliguric.  Continue Foley catheter for severe retention in the setting  of chronic UTI/cystitis/ colovesical fistula.  Recommend follow-up with urology to switch out catheter and check urodynamics in a few weeks.    Try to keep on the dry side with his chronic HFrEF 30%  Acute on chronic anemia.  I wrote for some more IV iron .  mobilize as tolerated to help recovery.  Enlist therapies in moderate/high risk patients as appropriate.  Physical therapy and Occupational Therapy ordered.    I updated the patient's status to the patient patient's wife was at bedside so was able to update and give a long discussion about surgical interventions, reasoning, postoperative recommendations and plans.  Recommendations were made.  Questions were answered.  He expressed some understanding & appreciation.  -Disposition:  Stable to discharge from surgery standpoint.  We will follow more peripherally while he is in house.    Can follow-up with me in about 3 weeks.    Consider seeing urology in about 2 weeks for Foley exchange and possible urodynamics. I believe Dr. Carolee is the main urologist taking care of him at [facility].   Defer to primary medicine service.   I reviewed nursing notes, hospitalist notes, last 24 h vitals and pain scores, last 48 h intake and output, last 24 h labs and trends, and last 24 h imaging results.  I have reviewed this patient's available data, including medical history, events of note, test results, etc as part of my evaluation.   A significant portion of that time was spent in counseling. Care during the described time interval was provided by me.  This care required moderate level of medical decision making.  03/16/2024    Subjective: (Chief complaint)  More alert.  Wanting to get out of bed.  Notes some itchiness on his back but denies any abdominal pain.  No nausea or vomiting.  Having bowel movements  Objective:  Vital signs:  Vitals:   03/15/24 0830 03/15/24 1043 03/15/24 1610 03/15/24 2146  BP: 94/61 126/78 129/75  110/73  Pulse: (!) 110 (!) 103 96 (!) 101  Resp: 18 18 17 18   Temp: 100 F (37.8 C) 98.6 F (37 C) (!) 97.4 F (36.3 C) 97.7 F (36.5 C)  TempSrc: Oral Oral    SpO2: 100% 100% 99% (!) 88%  Weight:      Height:        Last BM Date : 03/16/24  Intake/Output   Yesterday:  10/12 0701 - 10/13 0700 In: 480 [P.O.:480] Out: 653 [Urine:650; Stool:3] This shift:  Total I/O In: -  Out: 3 [Stool:3]  Bowel function:  Flatus: YES  BM:  YES -   Drain: (No drain)   Physical Exam:  General: Pt awake.  Sitting up in bed.  More talkative today, answering questions.  Mildly confused but directable.  Nurse tech in room helping monitor.   Eyes: PERRL, normal EOM.  Sclera clear.  No icterus Neuro: CN II-XII intact w/o focal sensory/motor deficits. Lymph: No head/neck/groin lymphadenopathy Psych: Infusion but no agitation.  No delerium/psychosis/paranoia.  Oriented x 2-4 HENT: Normocephalic, Mucus membranes moist.  No thrush Neck: Supple, No tracheal deviation.  No obvious thyromegaly Chest: No pain to chest wall compression.  Good respiratory excursion.  No audible wheezing CV:  Pulses intact.  Regular rhythm.  No major extremity edema MS: Normal AROM mjr joints.  No obvious deformity  Abdomen: Soft.  Nondistended.  Nontender at right lower quadrant old ostomy site.  No evidence of peritonitis.  No incarcerated hernias.  G.U Foley catheter in place with: Clear light yellow urine.  No obvious hematuria nor purulence Ext:   No deformity.  No mjr edema.  No cyanosis Skin: No petechiae / purpurea.  No major sores.  Warm and dry    Results:   Cultures: Recent Results (from the past 720 hours)  Culture, blood (Routine X 2) w Reflex to ID Panel     Status: Abnormal   Collection Time: 03/06/24  1:26 PM   Specimen: Right Antecubital; Blood  Result Value Ref Range Status   Specimen Description   Final    RIGHT ANTECUBITAL Performed at [facility], [address]  [place], [place] [address]    Special Requests   Final    BOTTLES DRAWN AEROBIC AND ANAEROBIC Blood Culture adequate volume Performed at [facility], [address], [place], [place] [address]    Culture  Setup Time   Final    GRAM NEGATIVE RODS IN BOTH AEROBIC AND ANAEROBIC BOTTLES CRITICAL RESULT CALLED TO, READ BACK BY AND VERIFIED WITH: PHARMD G ABBOTT 03/08/2024 @ 0305 BY AB Performed at [facility], [address], [place], [place] [address]    Culture KLEBSIELLA PNEUMONIAE (A)  Final   Report Status 03/10/2024 FINAL  Final   Organism ID, Bacteria KLEBSIELLA PNEUMONIAE  Final      Susceptibility   Klebsiella pneumoniae - MIC*    AMPICILLIN  RESISTANT Resistant     CEFAZOLIN (NON-URINE) 2 SENSITIVE Sensitive     CEFEPIME  <=0.12 SENSITIVE Sensitive     ERTAPENEM <=0.12 SENSITIVE Sensitive     CEFTRIAXONE  <=0.25 SENSITIVE Sensitive     CIPROFLOXACIN <=0.06 SENSITIVE Sensitive     GENTAMICIN <=1 SENSITIVE Sensitive     MEROPENEM <=0.25 SENSITIVE Sensitive     TRIMETH/SULFA <=20 SENSITIVE Sensitive     AMPICILLIN /SULBACTAM 4 SENSITIVE Sensitive     PIP/TAZO Value in next row Sensitive      <=4 SENSITIVEThis is a modified FDA-approved test that has been validated and its performance characteristics determined by the reporting laboratory.  This laboratory is certified under the Clinical Laboratory Improvement Amendments CLIA as qualified to perform high complexity clinical laboratory testing.    * KLEBSIELLA PNEUMONIAE  Blood Culture ID Panel (Reflexed)     Status: Abnormal   Collection Time: 03/06/24  1:26 PM  Result Value Ref Range Status   Enterococcus faecalis NOT DETECTED NOT DETECTED Final   Enterococcus Faecium NOT DETECTED NOT DETECTED Final   Listeria monocytogenes NOT DETECTED NOT DETECTED Final   Staphylococcus species NOT DETECTED NOT DETECTED Final   Staphylococcus aureus (BCID) NOT DETECTED NOT DETECTED Final   Staphylococcus epidermidis NOT  DETECTED NOT DETECTED Final   Staphylococcus lugdunensis NOT DETECTED NOT DETECTED Final   Streptococcus species NOT DETECTED NOT DETECTED Final   Streptococcus agalactiae NOT DETECTED NOT DETECTED Final   Streptococcus pneumoniae NOT DETECTED NOT DETECTED Final   Streptococcus pyogenes NOT DETECTED NOT DETECTED Final   A.calcoaceticus-baumannii NOT DETECTED NOT DETECTED Final   Bacteroides fragilis NOT DETECTED NOT DETECTED Final   Enterobacterales DETECTED (A) NOT DETECTED Final    Comment: Enterobacterales represent a large order of gram negative bacteria, not a single organism. CRITICAL RESULT CALLED TO, READ BACK BY AND VERIFIED WITH: PHARMD  G ABBOTT 03/08/2024 @ 0305 BY AB    Enterobacter cloacae complex NOT DETECTED NOT DETECTED Final   Escherichia coli NOT DETECTED NOT DETECTED Final   Klebsiella aerogenes NOT DETECTED NOT DETECTED Final   Klebsiella oxytoca NOT DETECTED NOT DETECTED Final   Klebsiella pneumoniae DETECTED (A) NOT DETECTED Final    Comment: CRITICAL RESULT CALLED TO, READ BACK BY AND VERIFIED WITH: PHARMD G ABBOTT 03/08/2024 @ 0305 BY AB    Proteus species NOT DETECTED NOT DETECTED Final   Salmonella species NOT DETECTED NOT DETECTED Final   Serratia marcescens NOT DETECTED NOT DETECTED Final   Haemophilus influenzae NOT DETECTED NOT DETECTED Final   Neisseria meningitidis NOT DETECTED NOT DETECTED Final   Pseudomonas aeruginosa NOT DETECTED NOT DETECTED Final   Stenotrophomonas maltophilia NOT DETECTED NOT DETECTED Final   Candida albicans NOT DETECTED NOT DETECTED Final   Candida auris NOT DETECTED NOT DETECTED Final   Candida glabrata NOT DETECTED NOT DETECTED Final   Candida krusei NOT DETECTED NOT DETECTED Final   Candida parapsilosis NOT DETECTED NOT DETECTED Final   Candida tropicalis NOT DETECTED NOT DETECTED Final   Cryptococcus neoformans/gattii NOT DETECTED NOT DETECTED Final   CTX-M ESBL NOT DETECTED NOT DETECTED Final   Carbapenem resistance  IMP NOT DETECTED NOT DETECTED Final   Carbapenem resistance KPC NOT DETECTED NOT DETECTED Final   Carbapenem resistance NDM NOT DETECTED NOT DETECTED Final   Carbapenem resist OXA 48 LIKE NOT DETECTED NOT DETECTED Final   Carbapenem resistance VIM NOT DETECTED NOT DETECTED Final    Comment: Performed at [facility], [address] [place], [place] [address]  Culture, blood (Routine X 2) w Reflex to ID Panel     Status: None   Collection Time: 03/06/24  1:31 PM   Specimen: BLOOD LEFT FOREARM  Result Value Ref Range Status   Specimen Description   Final    BLOOD LEFT FOREARM Performed at [facility], [address], [place], [place] [address]    Special Requests   Final    BOTTLES DRAWN AEROBIC AND ANAEROBIC Blood Culture adequate volume Performed at [facility], [address] [place], [place] [address]    Culture   Final    NO GROWTH 5 DAYS Performed at [facility], [address] [place], [place] [address]    Report Status 03/11/2024 FINAL  Final  Urine Culture     Status: Abnormal   Collection Time: 03/06/24  3:13 PM   Specimen: Urine, Random  Result Value Ref Range Status   Specimen Description   Final    URINE, RANDOM Performed at [facility], [address], [place], [place] [address]    Special Requests   Final    NONE Reflexed from [id number] Performed at [facility], [address] [place], [place] [address]    Culture >=100,000 COLONIES/mL KLEBSIELLA PNEUMONIAE (A)  Final   Report Status 03/08/2024 FINAL  Final   Organism ID, Bacteria KLEBSIELLA PNEUMONIAE (A)  Final      Susceptibility   Klebsiella pneumoniae - MIC*    AMPICILLIN  RESISTANT Resistant     CEFAZOLIN (URINE) Value in next row Sensitive      2 SENSITIVEThis is a modified FDA-approved test that has been validated and its performance characteristics determined by the reporting laboratory.  This laboratory is certified under  the Clinical Laboratory Improvement Amendments CLIA as qualified to perform high complexity clinical laboratory testing.  CEFEPIME  Value in next row Sensitive      2 SENSITIVEThis is a modified FDA-approved test that has been validated and its performance characteristics determined by the reporting laboratory.  This laboratory is certified under the Clinical Laboratory Improvement Amendments CLIA as qualified to perform high complexity clinical laboratory testing.    ERTAPENEM Value in next row Sensitive      2 SENSITIVEThis is a modified FDA-approved test that has been validated and its performance characteristics determined by the reporting laboratory.  This laboratory is certified under the Clinical Laboratory Improvement Amendments CLIA as qualified to perform high complexity clinical laboratory testing.    CEFTRIAXONE  Value in next row Sensitive      2 SENSITIVEThis is a modified FDA-approved test that has been validated and its performance characteristics determined by the reporting laboratory.  This laboratory is certified under the Clinical Laboratory Improvement Amendments CLIA as qualified to perform high complexity clinical laboratory testing.    CIPROFLOXACIN Value in next row Sensitive      2 SENSITIVEThis is a modified FDA-approved test that has been validated and its performance characteristics determined by the reporting laboratory.  This laboratory is certified under the Clinical Laboratory Improvement Amendments CLIA as qualified to perform high complexity clinical laboratory testing.    GENTAMICIN Value in next row Sensitive      2 SENSITIVEThis is a modified FDA-approved test that has been validated and its performance characteristics determined by the reporting laboratory.  This laboratory is certified under the Clinical Laboratory Improvement Amendments CLIA as qualified to perform high complexity clinical laboratory testing.    NITROFURANTOIN Value in next row Intermediate      2  SENSITIVEThis is a modified FDA-approved test that has been validated and its performance characteristics determined by the reporting laboratory.  This laboratory is certified under the Clinical Laboratory Improvement Amendments CLIA as qualified to perform high complexity clinical laboratory testing.    TRIMETH/SULFA Value in next row Sensitive      2 SENSITIVEThis is a modified FDA-approved test that has been validated and its performance characteristics determined by the reporting laboratory.  This laboratory is certified under the Clinical Laboratory Improvement Amendments CLIA as qualified to perform high complexity clinical laboratory testing.    AMPICILLIN /SULBACTAM Value in next row Sensitive      2 SENSITIVEThis is a modified FDA-approved test that has been validated and its performance characteristics determined by the reporting laboratory.  This laboratory is certified under the Clinical Laboratory Improvement Amendments CLIA as qualified to perform high complexity clinical laboratory testing.    PIP/TAZO Value in next row Sensitive      <=4 SENSITIVEThis is a modified FDA-approved test that has been validated and its performance characteristics determined by the reporting laboratory.  This laboratory is certified under the Clinical Laboratory Improvement Amendments CLIA as qualified to perform high complexity clinical laboratory testing.    MEROPENEM Value in next row Sensitive      <=4 SENSITIVEThis is a modified FDA-approved test that has been validated and its performance characteristics determined by the reporting laboratory.  This laboratory is certified under the Clinical Laboratory Improvement Amendments CLIA as qualified to perform high complexity clinical laboratory testing.    * >=100,000 COLONIES/mL KLEBSIELLA PNEUMONIAE    Labs: Results for orders placed or performed during the hospital encounter of 03/06/24 (from the past 48 hours)  Basic metabolic panel with GFR     Status:  Abnormal   Collection Time: 03/14/24  7:57 AM  Result  Value Ref Range   Sodium 139 135 - 145 mmol/L   Potassium 3.9 3.5 - 5.1 mmol/L   Chloride 108 98 - 111 mmol/L   CO2 21 (L) 22 - 32 mmol/L   Glucose, Bld 115 (H) 70 - 99 mg/dL    Comment: Glucose reference range applies only to samples taken after fasting for at least 8 hours.   BUN 17 8 - 23 mg/dL   Creatinine, Ser 8.04 (H) 0.61 - 1.24 mg/dL   Calcium  8.1 (L) 8.9 - 10.3 mg/dL   GFR, Estimated 35 (L) >60 mL/min    Comment: (NOTE) Calculated using the CKD-EPI Creatinine Equation (2021)    Anion gap 10 5 - 15    Comment: Performed at [facility], [address], [place], [place] [address]  CBC with Differential/Platelet     Status: Abnormal   Collection Time: 03/14/24  7:57 AM  Result Value Ref Range   WBC 10.3 4.0 - 10.5 K/uL   RBC 3.10 (L) 4.22 - 5.81 MIL/uL   Hemoglobin 8.1 (L) 13.0 - 17.0 g/dL   HCT 72.2 (L) 60.9 - 47.9 %   MCV 89.4 80.0 - 100.0 fL   MCH 26.1 26.0 - 34.0 pg   MCHC 29.2 (L) 30.0 - 36.0 g/dL   RDW 84.4 88.4 - 84.4 %   Platelets 156 150 - 400 K/uL   nRBC 0.0 0.0 - 0.2 %   Neutrophils Relative % 77 %   Neutro Abs 7.9 (H) 1.7 - 7.7 K/uL   Lymphocytes Relative 12 %   Lymphs Abs 1.3 0.7 - 4.0 K/uL   Monocytes Relative 6 %   Monocytes Absolute 0.7 0.1 - 1.0 K/uL   Eosinophils Relative 2 %   Eosinophils Absolute 0.2 0.0 - 0.5 K/uL   Basophils Relative 2 %   Basophils Absolute 0.2 (H) 0.0 - 0.1 K/uL   Immature Granulocytes 1 %   Abs Immature Granulocytes 0.09 (H) 0.00 - 0.07 K/uL    Comment: Performed at [facility], [address], [place], [place] [address]  Basic metabolic panel with GFR     Status: Abnormal   Collection Time: 03/15/24  5:24 AM  Result Value Ref Range   Sodium 143 135 - 145 mmol/L   Potassium 3.9 3.5 - 5.1 mmol/L   Chloride 108 98 - 111 mmol/L   CO2 18 (L) 22 - 32 mmol/L   Glucose, Bld 100 (H) 70 - 99 mg/dL    Comment: Glucose reference range applies only to samples  taken after fasting for at least 8 hours.   BUN 21 8 - 23 mg/dL   Creatinine, Ser 8.17 (H) 0.61 - 1.24 mg/dL   Calcium  8.0 (L) 8.9 - 10.3 mg/dL   GFR, Estimated 38 (L) >60 mL/min    Comment: (NOTE) Calculated using the CKD-EPI Creatinine Equation (2021)    Anion gap 17 (H) 5 - 15    Comment: Performed at [facility], [address], [place], [place] [address]  Glucose, capillary     Status: None   Collection Time: 03/15/24  8:20 AM  Result Value Ref Range   Glucose-Capillary 97 70 - 99 mg/dL    Comment: Glucose reference range applies only to samples taken after fasting for at least 8 hours.  CBC with Differential/Platelet     Status: Abnormal   Collection Time: 03/15/24  9:27 AM  Result Value Ref Range   WBC 8.6 4.0 - 10.5 K/uL  RBC 2.78 (L) 4.22 - 5.81 MIL/uL   Hemoglobin 7.2 (L) 13.0 - 17.0 g/dL   HCT 75.1 (L) 60.9 - 47.9 %   MCV 89.2 80.0 - 100.0 fL   MCH 25.9 (L) 26.0 - 34.0 pg   MCHC 29.0 (L) 30.0 - 36.0 g/dL   RDW 84.4 88.4 - 84.4 %   Platelets 158 150 - 400 K/uL   nRBC 0.0 0.0 - 0.2 %   Neutrophils Relative % 72 %   Neutro Abs 6.2 1.7 - 7.7 K/uL   Lymphocytes Relative 17 %   Lymphs Abs 1.5 0.7 - 4.0 K/uL   Monocytes Relative 6 %   Monocytes Absolute 0.6 0.1 - 1.0 K/uL   Eosinophils Relative 2 %   Eosinophils Absolute 0.2 0.0 - 0.5 K/uL   Basophils Relative 2 %   Basophils Absolute 0.2 (H) 0.0 - 0.1 K/uL   Immature Granulocytes 1 %   Abs Immature Granulocytes 0.09 (H) 0.00 - 0.07 K/uL    Comment: Performed at [facility], [address] [place], [place] [address]    Imaging / Studies: No results found.    Medications / Allergies: per chart  Antibiotics: Anti-infectives (From admission, onward)    Start     Dose/Rate Route Frequency Ordered Stop   03/12/24 2200  cefoTEtan  (CEFOTAN ) 2 g in sodium chloride  0.9 % 100 mL IVPB  Status:  Discontinued        2 g 200 mL/hr over 30 Minutes Intravenous Every 12 hours 03/12/24 1957 03/12/24 2043    03/12/24 0600  cefoTEtan  (CEFOTAN ) 2 g in sodium chloride  0.9 % 100 mL IVPB        2 g 200 mL/hr over 30 Minutes Intravenous On call to O.R. 03/11/24 1206 03/12/24 1550   03/10/24 1315  ceFAZolin (ANCEF) IVPB 2g/100 mL premix        2 g 200 mL/hr over 30 Minutes Intravenous Every 12 hours 03/10/24 1221 03/14/24 1759   03/07/24 1800  cefTRIAXone  (ROCEPHIN ) 2 g in sodium chloride  0.9 % 100 mL IVPB  Status:  Discontinued        2 g 200 mL/hr over 30 Minutes Intravenous Every 24 hours 03/07/24 0842 03/10/24 1221   03/07/24 0930  azithromycin (ZITHROMAX) tablet 500 mg  Status:  Discontinued        500 mg Oral Daily 03/07/24 0842 03/09/24 1046   03/06/24 1800  cefTRIAXone  (ROCEPHIN ) 1 g in sodium chloride  0.9 % 100 mL IVPB  Status:  Discontinued        1 g 200 mL/hr over 30 Minutes Intravenous Every 24 hours 03/06/24 1703 03/07/24 0842         Note: Portions of this report may have been transcribed using voice recognition software. Every effort was made to ensure accuracy; however, inadvertent computerized transcription errors may be present.   Any transcriptional errors that result from this process are unintentional.    Elspeth KYM Schultze, MD, FACS, MASCRS Esophageal, Gastrointestinal & Colorectal Surgery Robotic and Minimally Invasive Surgery  [facility] [address], [address] [place], [place] [address] [phone] Fax [phone] Main  CONTACT INFORMATION: Weekday (9AM-5PM): Call [facility] main office at [phone] Weeknight (5PM-9AM) or Weekend/Holiday: Check EPIC Web Links tab & use AMION (password  [email or web address]) for General Surgery [facility] coverage  Please, DO NOT use SecureChat  (it is not reliable communication to reach operating surgeons & will lead to a delay in care).  Epic staff messaging available for outptient concerns needing 1-2 business day response.      03/16/2024  6:16 AM

## 2024-03-16 NOTE — Care Management Important Message (Signed)
 Important Message  Patient Details  Name: Caio Devera MRN: [id number] Date of Birth: 18-Jun-1950   Important Message Given:  Yes - Medicare IM     Jon Cruel 03/16/2024, 4:47 PM

## 2024-03-17 ENCOUNTER — Inpatient Hospital Stay (HOSPITAL_COMMUNITY): Admitting: Anesthesiology

## 2024-03-17 ENCOUNTER — Encounter (HOSPITAL_COMMUNITY): Payer: Self-pay | Admitting: Family Medicine

## 2024-03-17 ENCOUNTER — Encounter (HOSPITAL_COMMUNITY)
Admission: EM | Disposition: A | Discharge status: Skilled Nursing Facility | Payer: Self-pay | Source: Skilled Nursing Facility | Attending: Family Medicine

## 2024-03-17 DIAGNOSIS — K449 Diaphragmatic hernia without obstruction or gangrene: Secondary | ICD-10-CM | POA: Diagnosis not present

## 2024-03-17 DIAGNOSIS — D62 Acute posthemorrhagic anemia: Secondary | ICD-10-CM

## 2024-03-17 DIAGNOSIS — N1831 Chronic kidney disease, stage 3a: Secondary | ICD-10-CM | POA: Diagnosis not present

## 2024-03-17 DIAGNOSIS — I110 Hypertensive heart disease with heart failure: Secondary | ICD-10-CM

## 2024-03-17 DIAGNOSIS — G4733 Obstructive sleep apnea (adult) (pediatric): Secondary | ICD-10-CM

## 2024-03-17 DIAGNOSIS — K921 Melena: Secondary | ICD-10-CM

## 2024-03-17 DIAGNOSIS — N179 Acute kidney failure, unspecified: Secondary | ICD-10-CM | POA: Diagnosis not present

## 2024-03-17 DIAGNOSIS — K2100 Gastro-esophageal reflux disease with esophagitis, without bleeding: Secondary | ICD-10-CM | POA: Diagnosis not present

## 2024-03-17 DIAGNOSIS — I5022 Chronic systolic (congestive) heart failure: Secondary | ICD-10-CM

## 2024-03-17 HISTORY — PX: ESOPHAGOGASTRODUODENOSCOPY: SHX5428

## 2024-03-17 LAB — CBC WITH DIFFERENTIAL/PLATELET
Abs Immature Granulocytes: 0.05 K/uL (ref 0.00–0.07)
Basophils Absolute: 0.2 K/uL — ABNORMAL HIGH (ref 0.0–0.1)
Basophils Relative: 3 %
Eosinophils Absolute: 0.3 K/uL (ref 0.0–0.5)
Eosinophils Relative: 4 %
HCT: 25.1 % — ABNORMAL LOW (ref 39.0–52.0)
Hemoglobin: 7.6 g/dL — ABNORMAL LOW (ref 13.0–17.0)
Immature Granulocytes: 1 %
Lymphocytes Relative: 27 %
Lymphs Abs: 2.1 K/uL (ref 0.7–4.0)
MCH: 26.9 pg (ref 26.0–34.0)
MCHC: 30.3 g/dL (ref 30.0–36.0)
MCV: 88.7 fL (ref 80.0–100.0)
Monocytes Absolute: 0.5 K/uL (ref 0.1–1.0)
Monocytes Relative: 7 %
Neutro Abs: 4.6 K/uL (ref 1.7–7.7)
Neutrophils Relative %: 58 %
Platelets: 238 K/uL (ref 150–400)
RBC: 2.83 MIL/uL — ABNORMAL LOW (ref 4.22–5.81)
RDW: 14.9 % (ref 11.5–15.5)
WBC: 7.8 K/uL (ref 4.0–10.5)
nRBC: 0 % (ref 0.0–0.2)

## 2024-03-17 LAB — BASIC METABOLIC PANEL WITH GFR
Anion gap: 13 (ref 5–15)
BUN: 16 mg/dL (ref 8–23)
CO2: 20 mmol/L — ABNORMAL LOW (ref 22–32)
Calcium: 8 mg/dL — ABNORMAL LOW (ref 8.9–10.3)
Chloride: 107 mmol/L (ref 98–111)
Creatinine, Ser: 1.63 mg/dL — ABNORMAL HIGH (ref 0.61–1.24)
GFR, Estimated: 44 mL/min — ABNORMAL LOW (ref >60)
Glucose, Bld: 98 mg/dL (ref 70–99)
Potassium: 3.7 mmol/L (ref 3.5–5.1)
Sodium: 140 mmol/L (ref 135–145)

## 2024-03-17 LAB — PHOSPHORUS: Phosphorus: 3.7 mg/dL (ref 2.5–4.6)

## 2024-03-17 LAB — TYPE AND SCREEN
ABO/RH(D): O POS
Antibody Screen: NEGATIVE
Unit division: 0

## 2024-03-17 LAB — BPAM RBC
Blood Product Expiration Date: 202511062359
ISSUE DATE / TIME: 202510131427
Unit Type and Rh: 5100

## 2024-03-17 LAB — SURGICAL PCR SCREEN
MRSA, PCR: NEGATIVE
Staphylococcus aureus: NEGATIVE

## 2024-03-17 LAB — MAGNESIUM: Magnesium: 1.5 mg/dL — ABNORMAL LOW (ref 1.7–2.4)

## 2024-03-17 SURGERY — EGD (ESOPHAGOGASTRODUODENOSCOPY)
Anesthesia: Monitor Anesthesia Care

## 2024-03-17 MED ORDER — PROPOFOL 10 MG/ML IV BOLUS
INTRAVENOUS | Status: DC | PRN
Start: 2024-03-17 — End: 2024-03-17
  Administered 2024-03-17: 20 mg via INTRAVENOUS

## 2024-03-17 MED ORDER — MAGNESIUM SULFATE 2 GM/50ML IV SOLN
2.0000 g | Freq: Once | INTRAVENOUS | Status: AC
  Administered 2024-03-17: 2 g via INTRAVENOUS
  Filled 2024-03-17: qty 50

## 2024-03-17 MED ORDER — LIDOCAINE 2% (20 MG/ML) 5 ML SYRINGE
INTRAMUSCULAR | Status: DC | PRN
  Administered 2024-03-17: 100 mg via INTRAVENOUS

## 2024-03-17 MED ORDER — MAGNESIUM SULFATE 2 GM/50ML IV SOLN: 2.0000 g | Freq: Once | INTRAVENOUS | Status: AC

## 2024-03-17 MED ORDER — SODIUM CHLORIDE 0.9 % IV SOLN: INTRAVENOUS | Status: DC

## 2024-03-17 MED ORDER — PROPOFOL 500 MG/50ML IV EMUL
INTRAVENOUS | Status: DC | PRN
  Administered 2024-03-17: 75 ug/kg/min via INTRAVENOUS

## 2024-03-17 NOTE — Anesthesia Postprocedure Evaluation (Signed)
 Anesthesia Post Note  Patient: Calvin Escobar  Procedure(s) Performed: EGD (ESOPHAGOGASTRODUODENOSCOPY)     Patient location during evaluation: PACU Anesthesia Type: MAC Level of consciousness: awake and alert Pain management: pain level controlled Vital Signs Assessment: post-procedure vital signs reviewed and stable Respiratory status: spontaneous breathing, nonlabored ventilation and respiratory function stable Cardiovascular status: blood pressure returned to baseline and stable Postop Assessment: no apparent nausea or vomiting Anesthetic complications: no   No notable events documented.  Last Vitals:  Vitals:   03/17/24 0934 03/17/24 1000  BP: 112/76 112/62  Pulse: (!) 110 (!) 103  Resp: 15 18  Temp:    SpO2: 98% 100%    Last Pain:  Vitals:   03/17/24 0917  TempSrc: Axillary  PainSc: 8                  Butler Levander Pinal

## 2024-03-17 NOTE — Anesthesia Preprocedure Evaluation (Addendum)
 Anesthesia Evaluation  Patient identified by MRN, date of birth, ID band Patient awake    Reviewed: Allergy & Precautions, H&P , NPO status , Patient's Chart, lab work & pertinent test results  Airway Mallampati: II  TM Distance: >3 FB Neck ROM: Full    Dental no notable dental hx. (+) Edentulous Upper, Partial Lower, Dental Advisory Given   Pulmonary sleep apnea    Pulmonary exam normal breath sounds clear to auscultation       Cardiovascular hypertension, Pt. on medications and Pt. on home beta blockers +CHF   Rhythm:Regular Rate:Normal     Neuro/Psych   Anxiety Depression Bipolar Disorder  Dementia negative neurological ROS     GI/Hepatic Neg liver ROS, PUD,GERD  ,,  Endo/Other  negative endocrine ROS    Renal/GU Renal InsufficiencyRenal disease  negative genitourinary   Musculoskeletal  (+) Arthritis , Osteoarthritis,    Abdominal   Peds  Hematology  (+) Blood dyscrasia, anemia   Anesthesia Other Findings   Reproductive/Obstetrics negative OB ROS                              Anesthesia Physical Anesthesia Plan  ASA: 3  Anesthesia Plan: MAC   Post-op Pain Management: Minimal or no pain anticipated   Induction: Intravenous  PONV Risk Score and Plan: 1 and Propofol  infusion  Airway Management Planned: Natural Airway and Simple Face Mask  Additional Equipment:   Intra-op Plan:   Post-operative Plan:   Informed Consent: I have reviewed the patients History and Physical, chart, labs and discussed the procedure including the risks, benefits and alternatives for the proposed anesthesia with the patient or authorized representative who has indicated his/her understanding and acceptance.     Dental advisory given  Plan Discussed with: CRNA  Anesthesia Plan Comments:          Anesthesia Quick Evaluation

## 2024-03-17 NOTE — Transfer of Care (Signed)
 Immediate Anesthesia Transfer of Care Note  Patient: Calvin Escobar  Procedure(s) Performed: EGD (ESOPHAGOGASTRODUODENOSCOPY)  Patient Location: PACU  Anesthesia Type:MAC  Level of Consciousness: awake and alert   Airway & Oxygen Therapy: Patient Spontanous Breathing and Patient connected to nasal cannula oxygen  Post-op Assessment: Report given to RN and Post -op Vital signs reviewed and stable  Post vital signs: Reviewed and stable  Last Vitals:  Vitals Value Taken Time  BP    Temp    Pulse    Resp    SpO2      Last Pain:  Vitals:   03/17/24 0800  TempSrc: Temporal  PainSc: 0-No pain      Patients Stated Pain Goal: 0 (03/10/24 1108)  Complications: No notable events documented.

## 2024-03-17 NOTE — Plan of Care (Signed)

## 2024-03-17 NOTE — Plan of Care (Signed)
  Problem: Pain Managment: Goal: General experience of comfort will improve and/or be controlled Outcome: Progressing   Problem: Safety: Goal: Ability to remain free from injury will improve Outcome: Progressing   Problem: Skin Integrity: Goal: Risk for impaired skin integrity will decrease Outcome: Progressing

## 2024-03-17 NOTE — Op Note (Addendum)
 [facility] Patient Name: Calvin Escobar Procedure Date : 03/17/2024 MRN: [id number] Attending MD: Gustav ALONSO Mcgee , MD, [id number] Date of Birth: 08-24-1950 CSN: [id number] Age: 73 Admit Type: Inpatient Procedure:                Upper GI endoscopy Indications:              Suspected upper gastrointestinal bleeding Providers:                Gustav ALONSO Mcgee, MD, Darleene Bare, RN, [facility]                            [place], Technician Referring MD:              Medicines:                Monitored Anesthesia Care Complications:            No immediate complications. Estimated Blood Loss:     Estimated blood loss was minimal. Procedure:                Pre-Anesthesia Assessment:                           - Prior to the procedure, a History and Physical                            was performed, and patient medications and                            allergies were reviewed. The patient's tolerance of                            previous anesthesia was also reviewed. The risks                            and benefits of the procedure and the sedation                            options and risks were discussed with the patient.                            All questions were answered, and informed consent                            was obtained. Prior Anticoagulants: The patient has                            taken no anticoagulant or antiplatelet agents. ASA                            Grade Assessment: III - A patient with severe                            systemic disease. After reviewing the risks and  benefits, the patient was deemed in satisfactory                            condition to undergo the procedure.                           After obtaining informed consent, the endoscope was                            passed under direct vision. Throughout the                            procedure, the patient's blood pressure, pulse, and                             oxygen saturations were monitored continuously. The                            [id number]-[id number] ([id number]) Olympus endoscope was introduced                            through the mouth, and advanced to the second part                            of duodenum. The upper GI endoscopy was                            accomplished without difficulty. The patient                            tolerated the procedure well. Scope In: Scope Out: Findings:      LA Grade A (one or more mucosal breaks less than 5 mm, not extending       between tops of 2 mucosal folds) esophagitis with no bleeding was found       38 to 40 cm from the incisors.      A 2 cm hiatal hernia was present.      The stomach was normal.      The cardia and gastric fundus were normal on retroflexion.      The examined duodenum was normal. Impression:               - LA Grade A reflux esophagitis with no bleeding.                           - 2 cm hiatal hernia.                           - Normal stomach.                           - Normal examined duodenum.                           - No specimens collected. Recommendation:           - Resume previous diet.                           -  Continue present medications.                           - No obvious source for possible upper GI source,                            possible post op blood loss is the etiology for                            mild decline in hgb. He is s/p recent takedown of                            ilesotomy                           - No further GI work up at this time                           - Monitor Hgb and transfuse if below 7                           - PPI daily                           - Inpatient GI signing off, please call with any                            questions                           - Called and discussed results with his wife,                            answere all her questions, in agreement with plan. Procedure Code(s):        ---  Professional ---                           [phone], Esophagogastroduodenoscopy, flexible,                            transoral; diagnostic, including collection of                            specimen(s) by brushing or washing, when performed                            (separate procedure) Diagnosis Code(s):        --- Professional ---                           K21.00, Gastro-esophageal reflux disease with                            esophagitis, without bleeding  K44.9, Diaphragmatic hernia without obstruction or                            gangrene CPT copyright 2022 American Medical Association. All rights reserved. The codes documented in this report are preliminary and upon coder review may  be revised to meet current compliance requirements. Analeise Mccleery V. Mackensi Mahadeo, MD 03/17/2024 10:00:55 AM This report has been signed electronically. Number of Addenda: 0

## 2024-03-17 NOTE — Progress Notes (Signed)
 SLP Cancellation Note  Patient Details Name: Calvin Escobar MRN: [id number] DOB: February 09, 1951   Cancelled treatment:       Reason Eval/Treat Not Completed: Patient at procedure or test/unavailable (EGD). SLP will continue following.    Damien [facility], M.A., CCC-SLP Speech Language Pathology, Acute Rehabilitation Services  Secure Chat preferred [phone]  03/17/2024, 9:28 AM

## 2024-03-17 NOTE — Progress Notes (Signed)
 Mobility Specialist Progress Note:    03/17/24 1423  Mobility  Activity Ambulated with assistance (In hallway)  Level of Assistance Minimal assist, patient does 75% or more  Assistive Device Front wheel walker  Distance Ambulated (ft) 45 ft (17'+17'+45')  Activity Response Tolerated well  Mobility Referral Yes  Mobility visit 1 Mobility  Mobility Specialist Start Time (ACUTE ONLY) 1320  Mobility Specialist Stop Time (ACUTE ONLY) 1345  Mobility Specialist Time Calculation (min) (ACUTE ONLY) 25 min   Received pt standing EOB w/ RN and agreeable to mobility. Pt required MinA STS, otherwise MinG. Pt had x3 seated rest breaks d/t fatigue. HR <125 bpm. Pt had no c/o. Returned to room without fault. Left pt in bed with alarm on. Personal belongings and call light within reach. All needs met.  Lavanda Pollack Mobility Specialist  Please contact via [employer] or  Rehab Office [phone]

## 2024-03-17 NOTE — Progress Notes (Signed)
 PROGRESS NOTE    Calvin Escobar  FMW:[id number] DOB: 06/28/1950 DOA: 03/06/2024 PCP: Calvin Escobar   Brief Narrative:  This 73 yrs old Male with PMH significant for CKD stage III A, anemia of chronic disease, hypertension, recurrent UTIs, chronic HFrEF, chronic indwelling Foley catheter, bipolar disorder, dementia, hyperlipidemia, OSA on CPAP, Morbid obesity, history of urinary retention, ileostomy in place, protein calorie malnutrition , hospitalization for colovesical fistula /sigmoid diverticulitis for which patient underwent robotic rectosigmoid resection with anastomosis, colovesicle fistula takedown and repair, diverting ileostomy and drainage of pelvic abscess and lysis of adhesions in 8/25.[name] He was re admitted at beginning of September this year for sepsis secondary to UTI/gram negative bacteremia and AKI on CKD stage 3a. He presents this admission from his SNF for AMS x 2 days. Found again to be in AKI with metabolic encephalopathy.  He was admitted for further evaluation.  Assessment & Plan:   Principal Problem:   Acute kidney injury superimposed on stage 3a chronic kidney disease (HCC) with hyperkalemia Active Problems:   Acute encephalopathy   Anemia of chronic disease   Hypertension   Recurrent UTI   Chronic kidney disease, stage 3a (HCC)   Diverticulitis of sigmoid colon s/p robotic colectomy 01/09/2024   Chronic HFrEF (heart failure with reduced ejection fraction) (HCC)   Chronic indwelling Foley catheter   Bipolar affective disorder, depressed (HCC)   Dementia (HCC)   GERD (gastroesophageal reflux disease)   HLD (hyperlipidemia)   OSA on CPAP   Hyperkalemia   Morbid obesity (HCC)   Hydronephrosis, right   History of urinary retention   Ileostomy in place [facility])   Malnutrition of moderate degree   Chronic hyponatremia   Protein-calorie malnutrition, severe   Gastrointestinal hemorrhage with melena   Postoperative anemia due to acute blood loss  Acute  kidney injury on CKD stage IIIA: Likely secondary to large volume losses with high output ostomy. Reversal of ostomy completed  on 03/12/24. Creatinine improved and back to baseline. AKI resolved.  Hypertension /sinus tachycardia: This seems to be intermittent, he has Foley catheter in place,  he is not confused. Blood pressure is well-controlled, Heart rate Improved. This could be secondary to pain, continue Coreg  twice daily.  Sepsis secondary to Klebsiella UTI: Urine culture confirms 100 K Klebsiella. Continue Ancef 2 g every 12 hours. Completed course of antibiotics. Sepsis resolved.  Acute Encephalopathy secondary to uremia and polypharmacy: Patient remains back to his baseline mental status as renal functions are improving. Ostomy reversal has been completed. Continue Seroquel 25 mg daily and 50 mg at bedtime for agitation. Continue BuSpar  10 mg TID daily.  Chronic HFr EF: Continue Coreg .  He appears euvolemic.  Chronic indwelling Foley catheter: Patient has history of BPH and urethral stricture s/p dilatation. Urology has evaluated the patient,  they recommended outpatient management options and have signed off.  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 Patient underwent closure ileostomy 10/9. Recommended dysphagia 1 diet. Surgery following. PT/ OT evaluation  Black stools/ Melena: Patient noted to have blackish stools on the sheet. H&H dropped to 7 from 8.6. GI consulted underwent EGD, no obvious source of acute GI bleed noted.  Likely postop in the setting of ileostomy reversal. GI signed off.  Continue to monitor H&H.  DVT prophylaxis: SCDs Code Status: Full code Family Communication: No family at bedside. Disposition Plan:    Status is: Inpatient Remains inpatient appropriate because: Severity of illness.   Consultants:  General Surgery Nephrology   Procedures: Ostomy reversal  Antimicrobials:   Anti-infectives (From admission, onward)    Start     Dose/Rate Route Frequency Ordered Stop   03/12/24 2200  cefoTEtan  (CEFOTAN ) 2 g in sodium chloride  0.9 % 100 mL IVPB  Status:  Discontinued        2 g 200 mL/hr over 30 Minutes Intravenous Every 12 hours 03/12/24 1957 03/12/24 2043   03/12/24 0600  cefoTEtan  (CEFOTAN ) 2 g in sodium chloride  0.9 % 100 mL IVPB        2 g 200 mL/hr over 30 Minutes Intravenous On call to O.R. 03/11/24 1206 03/12/24 1550   03/10/24 1315  ceFAZolin (ANCEF) IVPB 2g/100 mL premix        2 g 200 mL/hr over 30 Minutes Intravenous Every 12 hours 03/10/24 1221 03/14/24 1759   03/07/24 1800  cefTRIAXone  (ROCEPHIN ) 2 g in sodium chloride  0.9 % 100 mL IVPB  Status:  Discontinued        2 g 200 mL/hr over 30 Minutes Intravenous Every 24 hours 03/07/24 0842 03/10/24 1221   03/07/24 0930  azithromycin (ZITHROMAX) tablet 500 mg  Status:  Discontinued        500 mg Oral Daily 03/07/24 0842 03/09/24 1046   03/06/24 1800  cefTRIAXone  (ROCEPHIN ) 1 g in sodium chloride  0.9 % 100 mL IVPB  Status:  Discontinued        1 g 200 mL/hr over 30 Minutes Intravenous Every 24 hours 03/06/24 1703 03/07/24 0842       Subjective: Patient was seen and examined at bedside. Overnight events noted. Patient reports feeling better, status post EGD found to have reflux esophagitis, no obvious source of bleeding  Objective: Vitals:   03/17/24 0920 03/17/24 0930 03/17/24 0934 03/17/24 1000  BP: 99/63 117/81 112/76 112/62  Pulse: (!) 108 (!) 110 (!) 110 (!) 103  Resp: 17 19 15 18   Temp:      TempSrc:      SpO2: 98% 98% 98% 100%  Weight:      Height:        Intake/Output Summary (Last 24 hours) at 03/17/2024 1406 Last data filed at 03/17/2024 0900 Gross per 24 hour  Intake 996.17 ml  Output 325 ml  Net 671.17 ml   Filed Weights   03/15/24 0500 03/16/24 0500 03/17/24 0500  Weight: 91.4 kg 91.1 kg 94.3 kg    Examination:  General exam: Appears calm and comfortable, not in  any acute distress. Respiratory system: CTA Bilaterally. Respiratory effort normal. RR 16 Cardiovascular system: S1 & S2 heard, RRR. No JVD, murmurs, rubs, gallops or clicks.  Gastrointestinal system: Abdomen is non distended, soft and Mildly tender.  Dressing noted, Normal bowel sounds heard. Central nervous system: Alert and oriented x 3. No focal neurological deficits. Extremities: No edema, no cyanosis, no clubbing. Skin: No rashes, lesions or ulcers Psychiatry: Judgement and insight appear normal. Mood & affect appropriate.     Data Reviewed: I have personally reviewed following labs and imaging studies  CBC: Recent Labs  Lab 03/13/24 0302 03/14/24 0757 03/15/24 0927 03/16/24 0727 03/17/24 0421  WBC 12.2* 10.3 8.6 8.1 7.8  NEUTROABS 9.2* 7.9* 6.2 5.3 4.6  HGB 8.7* 8.1* 7.2* 7.0* 7.6*  HCT 28.8* 27.7* 24.8* 23.6* 25.1*  MCV 88.3 89.4 89.2 89.1 88.7  PLT 185 156 158 193 238   Basic Metabolic Panel: Recent Labs  Lab 03/11/24 0259 03/12/24 0240 03/13/24 0302 03/14/24 0757 03/15/24 0524 03/16/24 0727 03/17/24  0421  NA 139 140 136 139 143 142 140  K 4.4 4.4 4.2 3.9 3.9 4.1 3.7  CL 102 104 106 108 108 109 107  CO2 24 23 21* 21* 18* 21* 20*  GLUCOSE 107* 113* 123* 115* 100* 108* 98  BUN 25* 18 17 17 21 22 16   CREATININE 2.40* 2.16* 2.01* 1.95* 1.82* 1.87* 1.63*  CALCIUM  8.5* 8.4* 8.0* 8.1* 8.0* 8.4* 8.0*  MG 2.1 1.8 1.6*  --   --   --  1.5*  PHOS  --   --  4.3  --   --   --  3.7   GFR: Estimated Creatinine Clearance: 46.7 mL/min (A) (by C-G formula based on SCr of 1.63 mg/dL (H)). Liver Function Tests: Recent Labs  Lab 03/11/24 0259 03/12/24 0240  AST 18 18  ALT 9 8  ALKPHOS 58 59  BILITOT 0.5 0.4  PROT 6.1* 6.4*  ALBUMIN  2.5* 2.6*   No results for input(s): LIPASE, AMYLASE in the last 168 hours. No results for input(s): AMMONIA in the last 168 hours.  Coagulation Profile: No results for input(s): INR, PROTIME in the last 168 hours. Cardiac  Enzymes: No results for input(s): CKTOTAL, CKMB, CKMBINDEX, TROPONINI in the last 168 hours. BNP (last 3 results) No results for input(s): PROBNP in the last 8760 hours. HbA1C: No results for input(s): HGBA1C in the last 72 hours. CBG: Recent Labs  Lab 03/14/24 0605 03/15/24 0820  GLUCAP 89 97   Lipid Profile: No results for input(s): CHOL, HDL, LDLCALC, TRIG, CHOLHDL, LDLDIRECT in the last 72 hours. Thyroid  Function Tests: No results for input(s): TSH, T4TOTAL, FREET4, T3FREE, THYROIDAB in the last 72 hours. Anemia Panel: Recent Labs    03/16/24 0727  VITAMINB12 298  FOLATE >20.0  FERRITIN 635*  TIBC 143*  IRON  37*  RETICCTPCT 2.7   Sepsis Labs: No results for input(s): PROCALCITON, LATICACIDVEN in the last 168 hours.   Recent Results (from the past 240 hours)  Surgical PCR screen     Status: None   Collection Time: 03/17/24 12:20 AM   Specimen: Nasal Mucosa; Nasal Swab  Result Value Ref Range Status   MRSA, PCR NEGATIVE NEGATIVE Final   Staphylococcus aureus NEGATIVE NEGATIVE Final    Comment: (NOTE) The Xpert SA Assay (FDA approved for NASAL specimens in patients 28 years of age and older), is one component of a comprehensive surveillance program. It is not intended to diagnose infection nor to guide or monitor treatment. Performed at [facility], [address], [place], [place] [address]     Radiology Studies: No results found.  Scheduled Meds:  acetaminophen   500 mg Oral TID WC & HS   alvimopan   12 mg Oral BID   bisacodyl  10 mg Rectal Daily   busPIRone   10 mg Oral TID   carvedilol   3.125 mg Oral BID WC   Chlorhexidine  Gluconate Cloth  6 each Topical Daily   Chlorhexidine  Gluconate Cloth  6 each Topical Once   feeding supplement  237 mL Oral TID BM   finasteride   5 mg Oral Daily   folic acid   1 mg Oral Daily   melatonin  5 mg Oral QHS   multivitamin with minerals  1 tablet Oral Daily   pantoprazole   (PROTONIX ) IV  40 mg Intravenous Q12H   polycarbophil  625 mg Oral BID   QUEtiapine  25 mg Oral Q0600   QUEtiapine  50 mg Oral QHS   sodium chloride  flush  3 mL Intravenous Q12H   tamsulosin   0.4 mg Oral Daily   Continuous Infusions:  sodium chloride      iron  sucrose 200 mg (03/16/24 0951)     LOS: 11 days    Time spent: 35 mins    Darcel Dawley, [facility]   If 7PM-7AM, please contact night-coverage

## 2024-03-17 NOTE — Interval H&P Note (Signed)
 History and Physical Interval Note:  03/17/2024 8:20 AM  Calvin Escobar  has presented today for surgery, with the diagnosis of anemia, black stool.  The various methods of treatment have been discussed with the patient and family. After consideration of risks, benefits and other options for treatment, the patient has consented to  Procedure(s): EGD (ESOPHAGOGASTRODUODENOSCOPY) (N/A) as a surgical intervention.  The patient's history has been reviewed, patient examined, no change in status, stable for surgery.  I have reviewed the patient's chart and labs.  Questions were answered to the patient's satisfaction.     Karolina Zamor

## 2024-03-18 ENCOUNTER — Encounter (HOSPITAL_COMMUNITY): Payer: Self-pay | Admitting: Gastroenterology

## 2024-03-18 DIAGNOSIS — N1831 Chronic kidney disease, stage 3a: Secondary | ICD-10-CM | POA: Diagnosis not present

## 2024-03-18 DIAGNOSIS — N179 Acute kidney failure, unspecified: Secondary | ICD-10-CM | POA: Diagnosis not present

## 2024-03-18 LAB — CBC WITH DIFFERENTIAL/PLATELET
Abs Immature Granulocytes: 0.05 K/uL (ref 0.00–0.07)
Basophils Absolute: 0.2 K/uL — ABNORMAL HIGH (ref 0.0–0.1)
Basophils Relative: 3 %
Eosinophils Absolute: 0.3 K/uL (ref 0.0–0.5)
Eosinophils Relative: 4 %
HCT: 24.5 % — ABNORMAL LOW (ref 39.0–52.0)
Hemoglobin: 7.4 g/dL — ABNORMAL LOW (ref 13.0–17.0)
Immature Granulocytes: 1 %
Lymphocytes Relative: 30 %
Lymphs Abs: 2.1 K/uL (ref 0.7–4.0)
MCH: 27.1 pg (ref 26.0–34.0)
MCHC: 30.2 g/dL (ref 30.0–36.0)
MCV: 89.7 fL (ref 80.0–100.0)
Monocytes Absolute: 0.5 K/uL (ref 0.1–1.0)
Monocytes Relative: 7 %
Neutro Abs: 4 K/uL (ref 1.7–7.7)
Neutrophils Relative %: 55 %
Platelets: 277 K/uL (ref 150–400)
RBC: 2.73 MIL/uL — ABNORMAL LOW (ref 4.22–5.81)
RDW: 15 % (ref 11.5–15.5)
WBC: 7.1 K/uL (ref 4.0–10.5)
nRBC: 0 % (ref 0.0–0.2)

## 2024-03-18 LAB — BASIC METABOLIC PANEL WITH GFR
Anion gap: 10 (ref 5–15)
BUN: 14 mg/dL (ref 8–23)
CO2: 21 mmol/L — ABNORMAL LOW (ref 22–32)
Calcium: 8.1 mg/dL — ABNORMAL LOW (ref 8.9–10.3)
Chloride: 107 mmol/L (ref 98–111)
Creatinine, Ser: 1.61 mg/dL — ABNORMAL HIGH (ref 0.61–1.24)
GFR, Estimated: 44 mL/min — ABNORMAL LOW (ref >60)
Glucose, Bld: 101 mg/dL — ABNORMAL HIGH (ref 70–99)
Potassium: 3.8 mmol/L (ref 3.5–5.1)
Sodium: 138 mmol/L (ref 135–145)

## 2024-03-18 LAB — PHOSPHORUS: Phosphorus: 3.6 mg/dL (ref 2.5–4.6)

## 2024-03-18 LAB — MAGNESIUM: Magnesium: 1.7 mg/dL (ref 1.7–2.4)

## 2024-03-18 MED ORDER — BISACODYL 10 MG RE SUPP: 10.0000 mg | Freq: Every day | RECTAL | Status: DC | PRN

## 2024-03-18 MED ORDER — SODIUM CHLORIDE 0.9 % IV SOLN
200.0000 mg | Freq: Once | INTRAVENOUS | Status: AC
  Administered 2024-03-18: 200 mg via INTRAVENOUS
  Filled 2024-03-18: qty 10

## 2024-03-18 MED ORDER — HALOPERIDOL LACTATE 5 MG/ML IJ SOLN
2.0000 mg | Freq: Once | INTRAMUSCULAR | Status: AC
  Administered 2024-03-18: 2 mg via INTRAVENOUS
  Filled 2024-03-18: qty 1

## 2024-03-18 MED ORDER — PANTOPRAZOLE SODIUM 40 MG PO TBEC
40.0000 mg | DELAYED_RELEASE_TABLET | Freq: Every day | ORAL | Status: DC
  Administered 2024-03-18 – 2024-03-19 (×2): 40 mg via ORAL
  Filled 2024-03-18 (×2): qty 1

## 2024-03-18 MED ORDER — LOPERAMIDE HCL 2 MG PO CAPS
2.0000 mg | ORAL_CAPSULE | Freq: Two times a day (BID) | ORAL | Status: AC | PRN
Start: 2024-03-18 — End: ?

## 2024-03-18 MED ORDER — VITAMIN B-12 1000 MCG PO TABS
1000.0000 ug | ORAL_TABLET | Freq: Every day | ORAL | Status: AC
Start: 2024-03-18 — End: ?
  Administered 2024-03-18 – 2024-03-20 (×3): 1000 ug via ORAL
  Filled 2024-03-18 (×3): qty 1

## 2024-03-18 MED ORDER — CALCIUM POLYCARBOPHIL 625 MG PO TABS
1250.0000 mg | ORAL_TABLET | Freq: Every day | ORAL | Status: DC
  Administered 2024-03-18 – 2024-03-20 (×3): 1250 mg via ORAL
  Filled 2024-03-18 (×3): qty 2

## 2024-03-18 NOTE — Plan of Care (Signed)
   Problem: Health Behavior/Discharge Planning: Goal: Ability to manage health-related needs will improve Outcome: Progressing   Problem: Clinical Measurements: Goal: Will remain free from infection Outcome: Progressing   Problem: Activity: Goal: Risk for activity intolerance will decrease Outcome: Progressing

## 2024-03-18 NOTE — TOC Progression Note (Signed)
 Transition of Care [facility]) - Progression Note    Patient Details  Name: Calvin Escobar MRN: [id number] Date of Birth: May 26, 1951  [facility] CM/SW Contact  Littleton Haub LITTIE [place], [place] Phone Number: 03/18/2024, 12:26 PM  Clinical Narrative:    CSW initiated insurance process for [facility], ref# [id number]. [facility] can accept pt pending insurance approval. CSW will continue to follow.   Expected Discharge Plan: Skilled Nursing Facility Barriers to Discharge: Awaiting State Approval [place])               Expected Discharge Plan and Services In-house Referral: Clinical Social Work   Post Acute Care Choice: Skilled Nursing Facility Living arrangements for the past 2 months: Single Family Home, Skilled Nursing Facility                                       Social Drivers of Health (SDOH) Interventions SDOH Screenings   Food Insecurity: Patient Unable To Answer (03/07/2024)  Housing: Unknown (03/07/2024)  Transportation Needs: Patient Unable To Answer (03/07/2024)  Utilities: Not At Risk (03/07/2024)  Alcohol Screen: Low Risk  (10/10/2023)  Depression (PHQ2-9): Low Risk  (12/09/2023)  Recent Concern: Depression (PHQ2-9) - Medium Risk (09/16/2023)  Financial Resource Strain: Low Risk  (10/10/2023)  Physical Activity: Sufficiently Active (10/10/2023)  Social Connections: Moderately Integrated (03/07/2024)  Recent Concern: Social Connections - Socially Isolated (12/11/2023)  Stress: No Stress Concern Present (10/10/2023)  Tobacco Use: Low Risk  (03/17/2024)  Health Literacy: Adequate Health Literacy (10/10/2023)    Readmission Risk Interventions    03/09/2024    3:43 PM 01/14/2024    2:09 PM 07/04/2023    1:27 PM  Readmission Risk Prevention Plan  Transportation Screening Complete  Complete  PCP or Specialist Appt within 5-7 Days   Complete  Home Care Screening   Complete  Medication Review (RN CM)   Complete  Medication Review (RN Care Manager) Complete  Complete   PCP or Specialist appointment within 3-5 days of discharge Complete Complete   HRI or Home Care Consult Complete Complete   SW Recovery Care/Counseling Consult Complete Complete   Palliative Care Screening Not Applicable Not Applicable   Skilled Nursing Facility Complete Complete

## 2024-03-18 NOTE — Progress Notes (Addendum)
 PROGRESS NOTE    Calvin Escobar  FMW:[id number] DOB: 1950/09/28 DOA: 03/06/2024 PCP: [name], MD   Brief Narrative:  This 73 yrs old Male with PMH significant for CKD stage III A, anemia of chronic disease, hypertension, recurrent UTIs, chronic HFrEF, chronic indwelling Foley catheter, bipolar disorder, dementia, hyperlipidemia, OSA on CPAP, Morbid obesity, history of urinary retention, ileostomy in place, protein calorie malnutrition , hospitalization for colovesical fistula /sigmoid diverticulitis for which patient underwent robotic rectosigmoid resection with anastomosis, colovesicle fistula takedown and repair, diverting ileostomy and drainage of pelvic abscess and lysis of adhesions in 8/25.[name] He was re admitted at beginning of September this year for sepsis secondary to UTI/gram negative bacteremia and AKI on CKD stage 3a. He presents this admission from his SNF for AMS x 2 days. Found again to be in AKI with metabolic encephalopathy.  On 10/9 he underwent ileostomy takedown.  His hemoglobin steadily declined since surgery.  Patient was seen by GI and underwent EGD which was normal   Assessment & Plan:  Acute kidney injury on CKD stage IIIA: Likely secondary to large volume losses with high output ostomy. Reversal of ostomy completed  on 03/12/24. Creatinine improved and back to baseline.   Sepsis secondary to Klebsiella UTI: Urine culture:  Klebsiella. Completed course of antibiotics. Sepsis resolved.  Acute Encephalopathy secondary to uremia and polypharmacy: Patient remains back to his baseline mental status as renal functions are improving. Ostomy reversal has been completed. Continue Seroquel 25 mg daily and 50 mg at bedtime for agitation. Continue BuSpar  10 mg TID daily.  Chronic HFr EF: Continue Coreg .  He appears euvolemic.  Chronic indwelling Foley catheter: Patient has history of BPH and urethral stricture s/p dilatation. Urology has evaluated the patient,   they recommended outpatient management and have signed off.  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 Patient underwent closure ileostomy 10/9.  Black stools/ Melena: Patient noted to have blackish stools on the sheet. H&H dropped to 7 from 8.6.-Given 1 unit PRBC on 10/13 GI consulted underwent EGD, no obvious source of acute GI bleed noted.  Likely postop in the setting of ileostomy reversal. GI signed off.    DVT prophylaxis: SCDs Code Status: Full code Family Communication: No family at bedside. Disposition Plan: Await SNF placement      Consultants:  General Surgery Nephrology Urology GI  Procedures: Ostomy reversal     Subjective: Up working with the ambulatory team  Objective: Vitals:   03/17/24 1649 03/17/24 2056 03/18/24 0500 03/18/24 0832  BP: 124/65 101/63  (!) 143/83  Pulse: 83 88  (!) 103  Resp: 17 18    Temp:  98.7 F (37.1 C)  99 F (37.2 C)  TempSrc:  Oral  Oral  SpO2: 98% 99%  99%  Weight:   91.9 kg   Height:        Intake/Output Summary (Last 24 hours) at 03/18/2024 1143 Last data filed at 03/18/2024 0800 Gross per 24 hour  Intake 696 ml  Output 900 ml  Net -204 ml   Filed Weights   03/16/24 0500 03/17/24 0500 03/18/24 0500  Weight: 91.1 kg 94.3 kg 91.9 kg    Examination:   General: Appearance:     Overweight male in no acute distress     Lungs:     respirations unlabored  Heart:    Tachycardic. Normal rhythm. No murmurs, rubs, or gallops.   MS:   All extremities are intact.  Neurologic:   Awake, alert       Data Reviewed: I have personally reviewed following labs and imaging studies  CBC: Recent Labs  Lab 03/14/24 0757 03/15/24 0927 03/16/24 0727 03/17/24 0421 03/18/24 0517  WBC 10.3 8.6 8.1 7.8 7.1  NEUTROABS 7.9* 6.2 5.3 4.6 4.0  HGB 8.1* 7.2* 7.0* 7.6* 7.4*  HCT 27.7* 24.8* 23.6* 25.1* 24.5*  MCV 89.4 89.2 89.1 88.7 89.7  PLT 156 158 193 238 277   Basic  Metabolic Panel: Recent Labs  Lab 03/12/24 0240 03/13/24 0302 03/14/24 0757 03/15/24 0524 03/16/24 0727 03/17/24 0421 03/18/24 0517  NA 140 136 139 143 142 140 138  K 4.4 4.2 3.9 3.9 4.1 3.7 3.8  CL 104 106 108 108 109 107 107  CO2 23 21* 21* 18* 21* 20* 21*  GLUCOSE 113* 123* 115* 100* 108* 98 101*  BUN 18 17 17 21 22 16 14   CREATININE 2.16* 2.01* 1.95* 1.82* 1.87* 1.63* 1.61*  CALCIUM  8.4* 8.0* 8.1* 8.0* 8.4* 8.0* 8.1*  MG 1.8 1.6*  --   --   --  1.5* 1.7  PHOS  --  4.3  --   --   --  3.7 3.6   GFR: Estimated Creatinine Clearance: 43.5 mL/min (A) (by C-G formula based on SCr of 1.61 mg/dL (H)). Liver Function Tests: Recent Labs  Lab 03/12/24 0240  AST 18  ALT 8  ALKPHOS 59  BILITOT 0.4  PROT 6.4*  ALBUMIN  2.6*   No results for input(s): LIPASE, AMYLASE in the last 168 hours. No results for input(s): AMMONIA in the last 168 hours.  Coagulation Profile: No results for input(s): INR, PROTIME in the last 168 hours. Cardiac Enzymes: No results for input(s): CKTOTAL, CKMB, CKMBINDEX, TROPONINI in the last 168 hours. BNP (last 3 results) No results for input(s): PROBNP in the last 8760 hours. HbA1C: No results for input(s): HGBA1C in the last 72 hours. CBG: Recent Labs  Lab 03/14/24 0605 03/15/24 0820  GLUCAP 89 97   Lipid Profile: No results for input(s): CHOL, HDL, LDLCALC, TRIG, CHOLHDL, LDLDIRECT in the last 72 hours. Thyroid  Function Tests: No results for input(s): TSH, T4TOTAL, FREET4, T3FREE, THYROIDAB in the last 72 hours. Anemia Panel: Recent Labs    03/16/24 0727  VITAMINB12 298  FOLATE >20.0  FERRITIN 635*  TIBC 143*  IRON  37*  RETICCTPCT 2.7   Sepsis Labs: No results for input(s): PROCALCITON, LATICACIDVEN in the last 168 hours.   Recent Results (from the past 240 hours)  Surgical PCR screen     Status: None   Collection Time: 03/17/24 12:20 AM   Specimen: Nasal Mucosa; Nasal Swab  Result  Value Ref Range Status   MRSA, PCR NEGATIVE NEGATIVE Final   Staphylococcus aureus NEGATIVE NEGATIVE Final    Comment: (NOTE) The Xpert SA Assay (FDA approved for NASAL specimens in patients 45 years of age and older), is one component of a comprehensive surveillance program. It is not intended to diagnose infection nor to guide or monitor treatment. Performed at [facility], [address] [place], [place] [address]     Radiology Studies: No results found.  Scheduled Meds:  acetaminophen   500 mg Oral TID WC & HS   alvimopan   12 mg Oral BID   busPIRone   10 mg Oral TID   carvedilol   3.125 mg Oral BID WC   Chlorhexidine  Gluconate Cloth  6 each Topical Daily   Chlorhexidine  Gluconate Cloth  6 each Topical Once  feeding supplement  237 mL Oral TID BM   finasteride   5 mg Oral Daily   folic acid   1 mg Oral Daily   melatonin  5 mg Oral QHS   multivitamin with minerals  1 tablet Oral Daily   pantoprazole   40 mg Oral Q1200   polycarbophil  1,250 mg Oral Daily   QUEtiapine  25 mg Oral Q0600   QUEtiapine  50 mg Oral QHS   sodium chloride  flush  3 mL Intravenous Q12H   tamsulosin   0.4 mg Oral Daily   Continuous Infusions:  sodium chloride      iron  sucrose 200 mg (03/16/24 0951)     LOS: 12 days    Time spent: 35 mins    Harlene RAYMOND Bowl, DO [facility]   If 7PM-7AM, please contact night-coverage

## 2024-03-18 NOTE — Progress Notes (Signed)
 Speech Language Pathology Treatment: Dysphagia  Patient Details Name: Calvin Escobar MRN: [id number] DOB: 01/28/1951 Today's Date: 03/18/2024 Time: 9043-8993 SLP Time Calculation (min) (ACUTE ONLY): 10 min  Assessment / Plan / Recommendation Clinical Impression  Pt previously on puree, noted diet upgraded to Dys 3 then today up to regular likely after EGD (due to bleeding) showing  LA grade A reflux esophagitis with no bleeding, 2 cm hiatal hernia. He continues to exhibit safety with po's including solids and thin without s/s aspiration. He has mildly slower mastication, with slightly decreased respirations with swallow and minimal lingual residue. He is able to clear with liquid wash. Pt in agreement that Dys 3 (chopped meats) would be more efficient for pt at this time, continue thin, meds with puree, sit up 30 min after meals and check for potential pocketing. ST will follow while here and recommend continue ST at SNF for potential diet upgrade.    HPI HPI: Pt is a 73 y.o. male who presented from SNF for AMS x 2 days.  Found to be in AKI with metabolic encephalopathy and admitted. CXR (03/07/24) revealed, Subtle nodular density in the left mid lung may reflect a confluence of shadows. Pneumonia or underlying soft tissue lesion not excluded. Pt has had prior UGI study in 04/2021 that showed  Mild esophageal dysmotility, tiny hiatal herni and a fair amount of residual contrast in the oropharynx. From review of flouroscopy loops from UGI study, pt with appearance of secretions retained in pharynx that mixed with barium. BSE (06/06/23) noted, 3 ounce Yale water  challenge conducted at end of testing - resulting in pt overtly coughing - copiously and having mildly increased work of breathing... Suspect his primary risk is his known dysmotility exacerbated by his dyspnea. Recommend continue dys2/thin for now  BSE 03/07/24 recommended Dys 2/thin. Follow up recommended continue Dys 2/thin for duration of  hospital stay and f/u at SNF and d/c'd ST. (wife reported pt does not have dentures). Pt underwent CLOSURE, ILEOSTOMY on 10/9 and GI doc initiated Dys 1 (puree) and reordered ST for swallow and cognitive eval.  PMH: esophageal dysmotility, tiny hiatal hernia, CKD stage 3a, hx of VRE, HTN, HLD, OSA on cpap, ACD, dementia, bipolar, anxiety and depression, chronic indwelling foley catheter, non ischemic CM with HFrEF, hospitalization for colovesical fistula/sigmoid diveritculitis for which he underwent robotic rectosigmoid resection with anastomosis, colovesical fistula takedown and repair, diverting ileostomy, drainage of pelvic abscess and lysis of adhesions in 01/2024. Had some lower GI bleeding, EDG showed  LA Grade A reflux esophagitis with no bleeding.                            - 2 cm hiatal hernia.      SLP Plan  Continue with current plan of care          Recommendations  Diet recommendations: Dysphagia 3 (mechanical soft);Thin liquid Liquids provided via: Cup;Straw Medication Administration: Whole meds with puree Supervision: Staff to assist with self feeding;Full supervision/cueing for compensatory strategies Compensations: Minimize environmental distractions;Slow rate;Small sips/bites Postural Changes and/or Swallow Maneuvers: Seated upright 90 degrees;Upright 30-60 min after meal                  Oral care BID   Frequent or constant Supervision/Assistance Dysphagia, oral phase (R13.11)     Continue with current plan of care     Dustin Olam Bull  03/18/2024, 10:17 AM

## 2024-03-18 NOTE — Progress Notes (Signed)
 Mobility Specialist Progress Note:    03/18/24 1030  Mobility  Activity Ambulated with assistance (In hallway)  Level of Assistance Minimal assist, patient does 75% or more  Assistive Device Front wheel walker  Distance Ambulated (ft) 80 ft (40+40)  Activity Response Tolerated well  Mobility Referral Yes  Mobility visit 1 Mobility  Mobility Specialist Start Time (ACUTE ONLY) [id number]  Mobility Specialist Stop Time (ACUTE ONLY) [id number]  Mobility Specialist Time Calculation (min) (ACUTE ONLY) 20 min   Received pt in bed and agreeable to mobility. Pt required MinA STS, otherwise MinG and chair follow d/t safety. MS assisted with RW management. Pt had x3 seated rest breaks. Returned pt to room without fault. Left pt in bed with alarm on. Personal belongings and call light within reach. All needs met.  Lavanda Pollack Mobility Specialist  Please contact via [employer] or  Rehab Office [phone]

## 2024-03-19 DIAGNOSIS — N179 Acute kidney failure, unspecified: Secondary | ICD-10-CM | POA: Diagnosis not present

## 2024-03-19 DIAGNOSIS — N1831 Chronic kidney disease, stage 3a: Secondary | ICD-10-CM | POA: Diagnosis not present

## 2024-03-19 LAB — BASIC METABOLIC PANEL WITH GFR
Anion gap: 8 (ref 5–15)
BUN: 12 mg/dL (ref 8–23)
CO2: 23 mmol/L (ref 22–32)
Calcium: 8.2 mg/dL — ABNORMAL LOW (ref 8.9–10.3)
Chloride: 106 mmol/L (ref 98–111)
Creatinine, Ser: 1.54 mg/dL — ABNORMAL HIGH (ref 0.61–1.24)
GFR, Estimated: 47 mL/min — ABNORMAL LOW (ref >60)
Glucose, Bld: 101 mg/dL — ABNORMAL HIGH (ref 70–99)
Potassium: 4 mmol/L (ref 3.5–5.1)
Sodium: 137 mmol/L (ref 135–145)

## 2024-03-19 LAB — CBC
HCT: 24.5 % — ABNORMAL LOW (ref 39.0–52.0)
Hemoglobin: 7.4 g/dL — ABNORMAL LOW (ref 13.0–17.0)
MCH: 27 pg (ref 26.0–34.0)
MCHC: 30.2 g/dL (ref 30.0–36.0)
MCV: 89.4 fL (ref 80.0–100.0)
Platelets: 294 K/uL (ref 150–400)
RBC: 2.74 MIL/uL — ABNORMAL LOW (ref 4.22–5.81)
RDW: 15.2 % (ref 11.5–15.5)
WBC: 7 K/uL (ref 4.0–10.5)
nRBC: 0 % (ref 0.0–0.2)

## 2024-03-19 MED ORDER — GABAPENTIN 300 MG PO CAPS
300.0000 mg | ORAL_CAPSULE | Freq: Every day | ORAL | Status: DC
  Administered 2024-03-19: 300 mg via ORAL
  Filled 2024-03-19: qty 1

## 2024-03-19 MED ORDER — HYDROCODONE-ACETAMINOPHEN 5-325 MG PO TABS
1.0000 | ORAL_TABLET | Freq: Four times a day (QID) | ORAL | Status: DC | PRN
  Administered 2024-03-19 – 2024-03-20 (×3): 1 via ORAL
  Filled 2024-03-19 (×3): qty 1

## 2024-03-19 NOTE — Plan of Care (Signed)
  Problem: Education: Goal: Knowledge of General Education information will improve Description: Including pain rating scale, medication(s)/side effects and non-pharmacologic comfort measures Outcome: Progressing   Problem: Health Behavior/Discharge Planning: Goal: Ability to manage health-related needs will improve Outcome: Progressing   Problem: Activity: Goal: Risk for activity intolerance will decrease Outcome: Progressing   Problem: Nutrition: Goal: Adequate nutrition will be maintained Outcome: Progressing   Problem: Coping: Goal: Level of anxiety will decrease Outcome: Progressing   Problem: Pain Managment: Goal: General experience of comfort will improve and/or be controlled Outcome: Progressing   Problem: Elimination: Goal: Will not experience complications related to bowel motility Outcome: Progressing Goal: Will not experience complications related to urinary retention Outcome: Progressing   Problem: Safety: Goal: Ability to remain free from injury will improve Outcome: Progressing   Problem: Skin Integrity: Goal: Risk for impaired skin integrity will decrease Outcome: Progressing

## 2024-03-19 NOTE — Progress Notes (Signed)
 Physical Therapy Treatment Patient Details Name: Braelin Costlow MRN: [id number] DOB: [birth year] Today's Date: 03/19/2024   History of Present Illness 73 y.o. male presents to [facility] from SNF with AMS and hypotension. Admitted with AKI w/ hyperkalemia, UTI, and acute encephalopathy. Pt underwent EDG 10/14 which found GERD with esophagitis and diaphragmatic hernia, no obvious source of acute GI bleed noted. PMHx: CKD stage 3a, hx of VRE, HTN, HLD, OSA on cpap, ACD, dementia, bipolar, anxiety and depression, chronic indwelling foley catheter, non ischemic CM with HFrEF, colovesical fistula/sigmoid diveritculitis, dementia   PT Comments  Pt greeted with bed alarming, attempting to get OOB with all four bed rails up. He slid to the foot of the bed and was beginning to stand. PT facilitated pt's return to seated EOB providing CGA to maintain static seated balance. Introduced RW and applied gait belt in order to advance functional mobility. Pt ambulated with multiple gait abnormalities and demonstrated limited ability to correct despite moderate cues. He was unsteady and unable to maintain a straight trajectory, requiring minA for stability and RW navigation. Pt is limited by impaired cognition, impulsivity, decreased safety awareness, impaired balance, and decreased activity tolerance. Patient will benefit from continued inpatient follow up therapy, <3 hours/day. .   If plan is discharge home, recommend the following: Assistance with cooking/housework;Direct supervision/assist for medications management;Direct supervision/assist for financial management;Assist for transportation;Help with stairs or ramp for entrance;A little help with walking and/or transfers;A little help with bathing/dressing/bathroom;Supervision due to cognitive status   Can travel by private vehicle     Yes  Equipment Recommendations  Rolling walker (2 wheels)    Recommendations for Other Services       Precautions /  Restrictions Precautions Precautions: Fall Recall of Precautions/Restrictions: Impaired Precaution/Restrictions Comments: Contact Precautions Restrictions Weight Bearing Restrictions Per Provider Order: No     Mobility  Bed Mobility Overal bed mobility: Needs Assistance Bed Mobility: Supine to Sit     Supine to sit: Supervision, Used rails Sit to supine: Supervision   General bed mobility comments: Pt greeted with bed alarm going off, attempting to get out of bed with all four bed rails up. He scooted down towards FOB and was pulling on bed rails. Returning to bed pt swung BLE back into bed and controlled trunk down.    Transfers Overall transfer level: Needs assistance Equipment used: None, Rolling walker (2 wheels) Transfers: Sit to/from Stand Sit to Stand: Contact guard assist, Min assist           General transfer comment: Pt greeted attempting to stand from bed onto blue fall mat by pushing up on bedrail. Instructed pt to maintain static seated balance with CGA. Removed blue mat, put down side bedrail, and introduced RW. Pt stood up with light assist. Good eccentric control. Pt opted to stand from EOB without AD with BUE support on PT and took a couple of side steps to the left in order to get better position, higher up, before returning to bed.    Ambulation/Gait Ambulation/Gait assistance: Min assist Gait Distance (Feet): 100 Feet (x5, standing rest breaks) Assistive device: Rolling walker (2 wheels) Gait Pattern/deviations: Step-to pattern, Decreased step length - right, Decreased step length - left, Decreased stride length, Shuffle, Drifts right/left Gait velocity: decreased Gait velocity interpretation: <1.31 ft/sec, indicative of household ambulator   General Gait Details: Pt ambulated with short slow steps. He maintained RW slightly too far infront of him and fwd lean. Cues for proximity to RW. Assist to maintain pt  inside AD. Pt veered L/R in the hallway, he was  unable to maintain a straight trajectory. Assist to manage RW and improve pt's stability and safety awareness.   Stairs             Wheelchair Mobility     Tilt Bed    Modified Rankin (Stroke Patients Only)       Balance Overall balance assessment: Needs assistance Sitting-balance support: Feet supported, Bilateral upper extremity supported Sitting balance-Leahy Scale: Fair Sitting balance - Comments: Pt sat EOB with CGA   Standing balance support: Bilateral upper extremity supported, During functional activity, Reliant on assistive device for balance Standing balance-Leahy Scale: Poor Standing balance comment: Pt dependent on RW.                            Communication Communication Communication: No apparent difficulties  Cognition Arousal: Alert Behavior During Therapy: Flat affect, Impulsive   PT - Cognitive impairments: History of cognitive impairments, No family/caregiver present to determine baseline (Dementia)                       PT - Cognition Comments: Pt pleasantly confused. He is quick to attempt mobility with disregard for safety and lines/leads. Following commands: Impaired Following commands impaired: Follows one step commands inconsistently, Follows one step commands with increased time    Cueing Cueing Techniques: Verbal cues, Tactile cues  Exercises General Exercises - Lower Extremity Long Arc Quad: Seated, Both, AROM, 10 reps Hip Flexion/Marching: Standing, Both, AROM, 10 reps    General Comments General comments (skin integrity, edema, etc.): VSS on RA      Pertinent Vitals/Pain Pain Assessment Pain Assessment: PAINAD Breathing: normal Negative Vocalization: none Facial Expression: smiling or inexpressive Body Language: relaxed Consolability: no need to console PAINAD Score: 0 Pain Intervention(s): Monitored during session    Home Living                          Prior Function            PT  Goals (current goals can now be found in the care plan section) Acute Rehab PT Goals Patient Stated Goal: Move more PT Goal Formulation: With patient Time For Goal Achievement: 04/02/24 Potential to Achieve Goals: Good Progress towards PT goals: Progressing toward goals    Frequency    Min 1X/week      PT Plan      Co-evaluation              AM-PAC PT 6 Clicks Mobility   Outcome Measure  Help needed turning from your back to your side while in a flat bed without using bedrails?: A Little Help needed moving from lying on your back to sitting on the side of a flat bed without using bedrails?: A Little Help needed moving to and from a bed to a chair (including a wheelchair)?: A Little Help needed standing up from a chair using your arms (e.g., wheelchair or bedside chair)?: A Little Help needed to walk in hospital room?: A Little Help needed climbing 3-5 steps with a railing? : A Lot 6 Click Score: 17    End of Session Equipment Utilized During Treatment: Gait belt Activity Tolerance: Patient tolerated treatment well Patient left: in bed;with call bell/phone within reach;with bed alarm set Nurse Communication: Mobility status PT Visit Diagnosis: Unsteadiness on feet (R26.81);Other abnormalities of gait and mobility (  R26.89);Muscle weakness (generalized) (M62.81)     Time: 8459-8395 PT Time Calculation (min) (ACUTE ONLY): 24 min  Charges:    $Gait Training: 8-22 mins $Therapeutic Exercise: 8-22 mins PT General Charges $$ ACUTE PT VISIT: 1 Visit                     Randall SAUNDERS, PT, DPT Acute Rehabilitation Services Office: [phone] Secure Chat Preferred  Delon CHRISTELLA Callander 03/19/2024, 4:23 PM

## 2024-03-19 NOTE — Progress Notes (Signed)
 OT Cancellation Note  Patient Details Name: Calvin Escobar MRN: [id number] DOB: 01-06-1951   Cancelled Treatment:    Reason Eval/Treat Not Completed: Fatigue/lethargy limiting ability to participate (Pt declined OT, comfortably resting with eyes closed for entire interaction.)  Kennth Mliss Helling 03/19/2024, 12:15 PM Mliss HERO, OTR/L Acute Rehabilitation Services Office: [phone]

## 2024-03-19 NOTE — TOC Progression Note (Addendum)
 Transition of Care [facility]) - Progression Note    Patient Details  Name: Calvin Escobar MRN: [id number] Date of Birth: Sep 24, 1950  [facility] of [facility] CM/SW Contact  Karinna Beadles LITTIE [place], [place] Phone Number: 03/19/2024, 9:22 AM  Clinical Narrative:    CSW received insurance approval for [facility] PI#[id number]. Effective 10/16-10/20. CSW contacted facility, they do not have a bed open today but are hoping to have availability tomorrow. CSW will continue to follow.   Expected Discharge Plan: Skilled Nursing Facility Barriers to Discharge: Awaiting State Approval THEONE)               Expected Discharge Plan and Services In-house Referral: Clinical Social Work   Post Acute Care Choice: Skilled Nursing Facility Living arrangements for the past 2 months: Single Family Home, Skilled Nursing Facility                                       Social Drivers of Health (SDOH) Interventions SDOH Screenings   Food Insecurity: Patient Unable To Answer (03/07/2024)  Housing: Unknown (03/07/2024)  Transportation Needs: Patient Unable To Answer (03/07/2024)  Utilities: Not At Risk (03/07/2024)  Alcohol Screen: Low Risk  (10/10/2023)  Depression (PHQ2-9): Low Risk  (12/09/2023)  Recent Concern: Depression (PHQ2-9) - Medium Risk (09/16/2023)  Financial Resource Strain: Low Risk  (10/10/2023)  Physical Activity: Sufficiently Active (10/10/2023)  Social Connections: Moderately Integrated (03/07/2024)  Recent Concern: Social Connections - Socially Isolated (12/11/2023)  Stress: No Stress Concern Present (10/10/2023)  Tobacco Use: Low Risk  (03/17/2024)  Health Literacy: Adequate Health Literacy (10/10/2023)    Readmission Risk Interventions    03/09/2024    3:43 PM 01/14/2024    2:09 PM 07/04/2023    1:27 PM  Readmission Risk Prevention Plan  Transportation Screening Complete  Complete  PCP or Specialist Appt within 5-7 Days   Complete  Home Care Screening   Complete  Medication Review  (RN CM)   Complete  Medication Review (RN Care Manager) Complete Complete   PCP or Specialist appointment within 3-5 days of discharge Complete Complete   HRI or Home Care Consult Complete Complete   SW Recovery Care/Counseling Consult Complete Complete   Palliative Care Screening Not Applicable Not Applicable   Skilled Nursing Facility Complete Complete

## 2024-03-19 NOTE — Progress Notes (Signed)
 PROGRESS NOTE    Calvin Escobar  FMW:[id number] DOB: 1950/10/22 DOA: 03/06/2024 PCP: [name], MD   Brief Narrative:  This 73 yrs old Male with PMH significant for CKD stage III A, anemia of chronic disease, hypertension, recurrent UTIs, chronic HFrEF, chronic indwelling Foley catheter, bipolar disorder, dementia, hyperlipidemia, OSA on CPAP, Morbid obesity, history of urinary retention, ileostomy in place, protein calorie malnutrition , hospitalization for colovesical fistula /sigmoid diverticulitis for which patient underwent robotic rectosigmoid resection with anastomosis, colovesicle fistula takedown and repair, diverting ileostomy and drainage of pelvic abscess and lysis of adhesions in 8/25.[name] He was re admitted at beginning of September this year for sepsis secondary to UTI/gram negative bacteremia and AKI on CKD stage 3a. He presents this admission from his SNF for AMS x 2 days. Found again to be in AKI with metabolic encephalopathy.  On 10/9 he underwent ileostomy takedown.  His hemoglobin steadily declined since surgery.  Patient was seen by GI and underwent EGD which was normal.     Assessment & Plan:  Acute kidney injury on CKD stage IIIA: Likely secondary to large volume losses with high output ostomy. Reversal of ostomy completed  on 03/12/24. Creatinine improved and back to baseline.   Sepsis secondary to Klebsiella UTI: Urine culture:  Klebsiella. Completed course of antibiotics. Sepsis resolved.  Acute Encephalopathy secondary to uremia and polypharmacy: Patient remains back to his baseline mental status as renal functions are improving. Ostomy reversal has been completed. Continue Seroquel 25 mg daily and 50 mg at bedtime for agitation. Continue BuSpar  10 mg TID daily.  Chronic HFr EF: Continue Coreg .  He appears euvolemic.  Chronic indwelling Foley catheter: Patient has history of BPH and urethral stricture s/p dilatation. Urology has evaluated the  patient,  they recommended outpatient management and have signed off.  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 Patient underwent closure ileostomy 10/9.  Black stools/ Melena: Patient noted to have blackish stools on the sheet. H&H dropped to 7 from 8.6.-Given 1 unit PRBC on 10/13 GI consulted underwent EGD, no obvious source of acute GI bleed noted.  Likely postop in the setting of ileostomy reversal. -Hemoglobin stable GI signed off.    DVT prophylaxis: SCDs Code Status: Full code Family Communication: No family at bedside. Disposition Plan: Await SNF placement      Consultants:  General Surgery Nephrology Urology GI  Procedures: Ostomy reversal     Subjective: Good bowel sounds but some sharp pain at incision site  Objective: Vitals:   03/18/24 2155 03/19/24 0452 03/19/24 0455 03/19/24 0855  BP: 117/72 105/67  123/77  Pulse: 84 80  97  Resp: 14 12  16   Temp: 98.2 F (36.8 C) 98.2 F (36.8 C)  98.3 F (36.8 C)  TempSrc: Oral Axillary  Oral  SpO2: 96% 98%  98%  Weight:   91.2 kg   Height:        Intake/Output Summary (Last 24 hours) at 03/19/2024 1333 Last data filed at 03/19/2024 1200 Gross per 24 hour  Intake 477 ml  Output 1850 ml  Net -1373 ml   Filed Weights   03/17/24 0500 03/18/24 0500 03/19/24 0455  Weight: 94.3 kg 91.9 kg 91.2 kg    Examination:   General: Appearance:     Overweight male in no acute distress     Lungs:     respirations unlabored  Heart:    Normal heart rate  MS:   All extremities  are intact.   Neurologic:   Awake, alert       Data Reviewed: I have personally reviewed following labs and imaging studies  CBC: Recent Labs  Lab 03/14/24 0757 03/15/24 0927 03/16/24 0727 03/17/24 0421 03/18/24 0517 03/19/24 1151  WBC 10.3 8.6 8.1 7.8 7.1 7.0  NEUTROABS 7.9* 6.2 5.3 4.6 4.0  --   HGB 8.1* 7.2* 7.0* 7.6* 7.4* 7.4*  HCT 27.7* 24.8* 23.6* 25.1* 24.5* 24.5*  MCV  89.4 89.2 89.1 88.7 89.7 89.4  PLT 156 158 193 238 277 294   Basic Metabolic Panel: Recent Labs  Lab 03/13/24 0302 03/14/24 0757 03/15/24 0524 03/16/24 0727 03/17/24 0421 03/18/24 0517 03/19/24 1151  NA 136   < > 143 142 140 138 137  K 4.2   < > 3.9 4.1 3.7 3.8 4.0  CL 106   < > 108 109 107 107 106  CO2 21*   < > 18* 21* 20* 21* 23  GLUCOSE 123*   < > 100* 108* 98 101* 101*  BUN 17   < > 21 22 16 14 12   CREATININE 2.01*   < > 1.82* 1.87* 1.63* 1.61* 1.54*  CALCIUM  8.0*   < > 8.0* 8.4* 8.0* 8.1* 8.2*  MG 1.6*  --   --   --  1.5* 1.7  --   PHOS 4.3  --   --   --  3.7 3.6  --    < > = values in this interval not displayed.   GFR: Estimated Creatinine Clearance: 45.5 mL/min (A) (by C-G formula based on SCr of 1.54 mg/dL (H)). Liver Function Tests: No results for input(s): AST, ALT, ALKPHOS, BILITOT, PROT, ALBUMIN  in the last 168 hours.  No results for input(s): LIPASE, AMYLASE in the last 168 hours. No results for input(s): AMMONIA in the last 168 hours.  Coagulation Profile: No results for input(s): INR, PROTIME in the last 168 hours. Cardiac Enzymes: No results for input(s): CKTOTAL, CKMB, CKMBINDEX, TROPONINI in the last 168 hours. BNP (last 3 results) No results for input(s): PROBNP in the last 8760 hours. HbA1C: No results for input(s): HGBA1C in the last 72 hours. CBG: Recent Labs  Lab 03/14/24 0605 03/15/24 0820  GLUCAP 89 97   Lipid Profile: No results for input(s): CHOL, HDL, LDLCALC, TRIG, CHOLHDL, LDLDIRECT in the last 72 hours. Thyroid  Function Tests: No results for input(s): TSH, T4TOTAL, FREET4, T3FREE, THYROIDAB in the last 72 hours. Anemia Panel: No results for input(s): VITAMINB12, FOLATE, FERRITIN, TIBC, IRON , RETICCTPCT in the last 72 hours.  Sepsis Labs: No results for input(s): PROCALCITON, LATICACIDVEN in the last 168 hours.   Recent Results (from the past 240 hours)   Surgical PCR screen     Status: None   Collection Time: 03/17/24 12:20 AM   Specimen: Nasal Mucosa; Nasal Swab  Result Value Ref Range Status   MRSA, PCR NEGATIVE NEGATIVE Final   Staphylococcus aureus NEGATIVE NEGATIVE Final    Comment: (NOTE) The Xpert SA Assay (FDA approved for NASAL specimens in patients 28 years of age and older), is one component of a comprehensive surveillance program. It is not intended to diagnose infection nor to guide or monitor treatment. Performed at [facility], [address], [place], [place] [address]     Radiology Studies: No results found.  Scheduled Meds:  acetaminophen   500 mg Oral TID WC & HS   busPIRone   10 mg Oral TID   carvedilol   3.125 mg Oral BID  WC   Chlorhexidine  Gluconate Cloth  6 each Topical Daily   Chlorhexidine  Gluconate Cloth  6 each Topical Once   vitamin B-12  1,000 mcg Oral Daily   feeding supplement  237 mL Oral TID BM   finasteride   5 mg Oral Daily   folic acid   1 mg Oral Daily   gabapentin   300 mg Oral QHS   melatonin  5 mg Oral QHS   multivitamin with minerals  1 tablet Oral Daily   pantoprazole   40 mg Oral Q1200   polycarbophil  1,250 mg Oral Daily   QUEtiapine  25 mg Oral Q0600   QUEtiapine  50 mg Oral QHS   sodium chloride  flush  3 mL Intravenous Q12H   tamsulosin   0.4 mg Oral Daily   Continuous Infusions:  sodium chloride        LOS: 13 days    Time spent: 35 mins    Harlene RAYMOND Bowl, DO [facility]   If 7PM-7AM, please contact night-coverage

## 2024-03-20 DIAGNOSIS — Z6832 Body mass index (BMI) 32.0-32.9, adult: Secondary | ICD-10-CM | POA: Diagnosis not present

## 2024-03-20 DIAGNOSIS — R262 Difficulty in walking, not elsewhere classified: Secondary | ICD-10-CM | POA: Diagnosis not present

## 2024-03-20 DIAGNOSIS — R8290 Unspecified abnormal findings in urine: Secondary | ICD-10-CM | POA: Diagnosis not present

## 2024-03-20 DIAGNOSIS — E871 Hypo-osmolality and hyponatremia: Secondary | ICD-10-CM | POA: Diagnosis not present

## 2024-03-20 DIAGNOSIS — N390 Urinary tract infection, site not specified: Secondary | ICD-10-CM | POA: Diagnosis not present

## 2024-03-20 DIAGNOSIS — K219 Gastro-esophageal reflux disease without esophagitis: Secondary | ICD-10-CM | POA: Diagnosis not present

## 2024-03-20 DIAGNOSIS — D638 Anemia in other chronic diseases classified elsewhere: Secondary | ICD-10-CM | POA: Diagnosis not present

## 2024-03-20 DIAGNOSIS — M6281 Muscle weakness (generalized): Secondary | ICD-10-CM | POA: Diagnosis not present

## 2024-03-20 DIAGNOSIS — K5732 Diverticulitis of large intestine without perforation or abscess without bleeding: Secondary | ICD-10-CM | POA: Diagnosis not present

## 2024-03-20 DIAGNOSIS — N321 Vesicointestinal fistula: Secondary | ICD-10-CM | POA: Diagnosis not present

## 2024-03-20 DIAGNOSIS — A419 Sepsis, unspecified organism: Secondary | ICD-10-CM | POA: Diagnosis not present

## 2024-03-20 DIAGNOSIS — E785 Hyperlipidemia, unspecified: Secondary | ICD-10-CM | POA: Diagnosis not present

## 2024-03-20 DIAGNOSIS — I5022 Chronic systolic (congestive) heart failure: Secondary | ICD-10-CM | POA: Diagnosis not present

## 2024-03-20 DIAGNOSIS — I110 Hypertensive heart disease with heart failure: Secondary | ICD-10-CM | POA: Diagnosis not present

## 2024-03-20 DIAGNOSIS — R1312 Dysphagia, oropharyngeal phase: Secondary | ICD-10-CM | POA: Diagnosis not present

## 2024-03-20 DIAGNOSIS — I129 Hypertensive chronic kidney disease with stage 1 through stage 4 chronic kidney disease, or unspecified chronic kidney disease: Secondary | ICD-10-CM | POA: Diagnosis not present

## 2024-03-20 DIAGNOSIS — R41841 Cognitive communication deficit: Secondary | ICD-10-CM | POA: Diagnosis not present

## 2024-03-20 DIAGNOSIS — K922 Gastrointestinal hemorrhage, unspecified: Secondary | ICD-10-CM | POA: Diagnosis not present

## 2024-03-20 DIAGNOSIS — Z980 Intestinal bypass and anastomosis status: Secondary | ICD-10-CM | POA: Diagnosis not present

## 2024-03-20 DIAGNOSIS — T83511D Infection and inflammatory reaction due to indwelling urethral catheter, subsequent encounter: Secondary | ICD-10-CM | POA: Diagnosis not present

## 2024-03-20 DIAGNOSIS — D631 Anemia in chronic kidney disease: Secondary | ICD-10-CM | POA: Diagnosis not present

## 2024-03-20 DIAGNOSIS — R2681 Unsteadiness on feet: Secondary | ICD-10-CM | POA: Diagnosis not present

## 2024-03-20 DIAGNOSIS — I130 Hypertensive heart and chronic kidney disease with heart failure and stage 1 through stage 4 chronic kidney disease, or unspecified chronic kidney disease: Secondary | ICD-10-CM | POA: Diagnosis not present

## 2024-03-20 DIAGNOSIS — N179 Acute kidney failure, unspecified: Secondary | ICD-10-CM | POA: Diagnosis not present

## 2024-03-20 DIAGNOSIS — N3000 Acute cystitis without hematuria: Secondary | ICD-10-CM | POA: Diagnosis not present

## 2024-03-20 DIAGNOSIS — N1831 Chronic kidney disease, stage 3a: Secondary | ICD-10-CM | POA: Diagnosis not present

## 2024-03-20 DIAGNOSIS — N400 Enlarged prostate without lower urinary tract symptoms: Secondary | ICD-10-CM | POA: Diagnosis not present

## 2024-03-20 DIAGNOSIS — F0390 Unspecified dementia without behavioral disturbance: Secondary | ICD-10-CM | POA: Diagnosis not present

## 2024-03-20 DIAGNOSIS — N139 Obstructive and reflux uropathy, unspecified: Secondary | ICD-10-CM | POA: Diagnosis not present

## 2024-03-20 DIAGNOSIS — S0990XA Unspecified injury of head, initial encounter: Secondary | ICD-10-CM | POA: Diagnosis not present

## 2024-03-20 DIAGNOSIS — E860 Dehydration: Secondary | ICD-10-CM | POA: Diagnosis not present

## 2024-03-20 MED ORDER — MAGIC MOUTHWASH: 15.0000 mL | Freq: Four times a day (QID) | ORAL | Status: AC | PRN

## 2024-03-20 MED ORDER — CARVEDILOL 3.125 MG PO TABS: 3.1250 mg | ORAL_TABLET | Freq: Two times a day (BID) | ORAL | Status: DC

## 2024-03-20 MED ORDER — HYDROCODONE-ACETAMINOPHEN 5-325 MG PO TABS: 1.0000 | ORAL_TABLET | Freq: Four times a day (QID) | ORAL | 0 refills | Status: DC | PRN

## 2024-03-20 MED ORDER — QUETIAPINE FUMARATE 25 MG PO TABS: 25.0000 mg | ORAL_TABLET | Freq: Every day | ORAL | Status: DC

## 2024-03-20 MED ORDER — QUETIAPINE FUMARATE 50 MG PO TABS: 50.0000 mg | ORAL_TABLET | Freq: Every day | ORAL | Status: DC

## 2024-03-20 MED ORDER — ADULT MULTIVITAMIN W/MINERALS CH: 1.0000 | ORAL_TABLET | Freq: Every day | ORAL | Status: DC

## 2024-03-20 MED ORDER — CYANOCOBALAMIN 1000 MCG PO TABS: 1000.0000 ug | ORAL_TABLET | Freq: Every day | ORAL | Status: DC

## 2024-03-20 MED ORDER — MELATONIN 5 MG PO TABS: 5.0000 mg | ORAL_TABLET | Freq: Every day | ORAL | Status: DC

## 2024-03-20 MED ORDER — ENSURE PLUS HIGH PROTEIN PO LIQD: 237.0000 mL | Freq: Three times a day (TID) | ORAL | Status: DC

## 2024-03-20 NOTE — TOC Transition Note (Signed)
 Transition of Care [facility]) - Discharge Note   Patient Details  Name: Calvin Escobar MRN: [id number] Date of Birth: 1950/12/19  [facility] CM/SW Contact:  Jeoffrey LITTIE Maranda ISRAEL Phone Number: 03/20/2024, 10:52 AM   Clinical Narrative:    Patient will DC to: [facility] Anticipated DC date: 03/20/24 Family notified: Yes  Transport by: [facility]   Per MD patient ready for DC to [facility]. RN to call report prior to discharge [phone] room 3251. RN, patient, patient's family, and facility notified of DC. Discharge Summary and FL2 sent to facility. DC packet on chart. Ambulance transport requested for patient.   CSW will sign off for now as social work intervention is no longer needed. Please consult us  again if new needs arise.       Barriers to Discharge: Awaiting State Approval [facility])   Patient Goals and CMS Choice Patient states their goals for this hospitalization and ongoing recovery are:: Return to rehab          Discharge Placement                       Discharge Plan and Services Additional resources added to the After Visit Summary for   In-house Referral: Clinical Social Work   Post Acute Care Choice: Skilled Nursing Facility                               Social Drivers of Health (SDOH) Interventions SDOH Screenings   Food Insecurity: Patient Unable To Answer (03/07/2024)  Housing: Unknown (03/07/2024)  Transportation Needs: Patient Unable To Answer (03/07/2024)  Utilities: Not At Risk (03/07/2024)  Alcohol Screen: Low Risk  (10/10/2023)  Depression (PHQ2-9): Low Risk  (12/09/2023)  Recent Concern: Depression (PHQ2-9) - Medium Risk (09/16/2023)  Financial Resource Strain: Low Risk  (10/10/2023)  Physical Activity: Sufficiently Active (10/10/2023)  Social Connections: Moderately Integrated (03/07/2024)  Recent Concern: Social Connections - Socially Isolated (12/11/2023)  Stress: No Stress Concern Present (10/10/2023)  Tobacco Use: Low  Risk  (03/17/2024)  Health Literacy: Adequate Health Literacy (10/10/2023)     Readmission Risk Interventions    03/09/2024    3:43 PM 01/14/2024    2:09 PM 07/04/2023    1:27 PM  Readmission Risk Prevention Plan  Transportation Screening Complete  Complete  PCP or Specialist Appt within 5-7 Days   Complete  Home Care Screening   Complete  Medication Review (RN CM)   Complete  Medication Review (RN Care Manager) Complete Complete   PCP or Specialist appointment within 3-5 days of discharge Complete Complete   HRI or Home Care Consult Complete Complete   SW Recovery Care/Counseling Consult Complete Complete   Palliative Care Screening Not Applicable Not Applicable   Skilled Nursing Facility Complete Complete

## 2024-03-20 NOTE — Plan of Care (Signed)
  Problem: Education: Goal: Knowledge of General Education information will improve Description: Including pain rating scale, medication(s)/side effects and non-pharmacologic comfort measures Outcome: Progressing   Problem: Health Behavior/Discharge Planning: Goal: Ability to manage health-related needs will improve Outcome: Progressing   Problem: Clinical Measurements: Goal: Ability to maintain clinical measurements within normal limits will improve Outcome: Progressing Goal: Will remain free from infection Outcome: Progressing Goal: Diagnostic test results will improve Outcome: Progressing Goal: Respiratory complications will improve Outcome: Progressing Goal: Cardiovascular complication will be avoided Outcome: Progressing   Problem: Activity: Goal: Risk for activity intolerance will decrease Outcome: Progressing   Problem: Nutrition: Goal: Adequate nutrition will be maintained Outcome: Progressing   Problem: Coping: Goal: Level of anxiety will decrease Outcome: Progressing   Problem: Elimination: Goal: Will not experience complications related to bowel motility Outcome: Progressing Goal: Will not experience complications related to urinary retention Outcome: Progressing   Problem: Pain Managment: Goal: General experience of comfort will improve and/or be controlled Outcome: Progressing   Problem: Safety: Goal: Ability to remain free from injury will improve Outcome: Progressing   Problem: Skin Integrity: Goal: Risk for impaired skin integrity will decrease Outcome: Progressing   Problem: Safety: Goal: Non-violent Restraint(s) Outcome: Progressing   Problem: Education: Goal: Understanding of discharge needs will improve Outcome: Progressing Goal: Verbalization of understanding of the causes of altered bowel function will improve Outcome: Progressing   Problem: Activity: Goal: Ability to tolerate increased activity will improve Outcome: Progressing    Problem: Bowel/Gastric: Goal: Gastrointestinal status for postoperative course will improve Outcome: Progressing   Problem: Health Behavior/Discharge Planning: Goal: Identification of community resources to assist with postoperative recovery needs will improve Outcome: Progressing   Problem: Nutritional: Goal: Will attain and maintain optimal nutritional status will improve Outcome: Progressing   Problem: Clinical Measurements: Goal: Postoperative complications will be avoided or minimized Outcome: Progressing   Problem: Respiratory: Goal: Respiratory status will improve Outcome: Progressing   Problem: Skin Integrity: Goal: Will show signs of wound healing Outcome: Progressing

## 2024-03-20 NOTE — Progress Notes (Signed)
 Mobility Specialist Progress Note:   03/20/24 1105  Mobility  Activity Ambulated with assistance (In hallway)  Level of Assistance Contact guard assist, steadying assist  Assistive Device Front wheel walker  Distance Ambulated (ft) 130 ft  Activity Response Tolerated well  Mobility Referral Yes  Mobility visit 1 Mobility  Mobility Specialist Start Time (ACUTE ONLY) 1041  Mobility Specialist Stop Time (ACUTE ONLY) 1051  Mobility Specialist Time Calculation (min) (ACUTE ONLY) 10 min   Received pt in bed and agreeable to mobility. Pt required MinG for safety. Further mobility deferred d/t patient being lethargic. Returned pt to room without fault. Left pt in bed with alarm on. Personal belongings and call light within reach. All needs met.  Lavanda Pollack Mobility Specialist  Please contact via [employer] or  Rehab Office [phone]

## 2024-03-20 NOTE — Progress Notes (Signed)
 Report called to Vernell at [facility].

## 2024-03-20 NOTE — Discharge Summary (Addendum)
 Physician Discharge Summary  Calvin Escobar FMW:[id number] DOB: [birth year] DOA: 03/06/2024  PCP: Calvin Escobar MATSU, MD  Admit date: 03/06/2024 Discharge date: 03/20/2024  Admitted From:  Discharge disposition: Home   Recommendations for Outpatient Follow-Up:   CBC/BMP in 1 week Delirium precautions: Patient impulsive but easily redirected, remains fall risk Bowel regimen while on pain medication Consider palliative care outpatient follow-up pending course   Discharge Diagnosis:   Principal Problem:   Acute kidney injury superimposed on stage 3a chronic kidney disease (HCC) with hyperkalemia Active Problems:   Acute encephalopathy   Anemia of chronic disease   Hypertension   Recurrent UTI   Chronic kidney disease, stage 3a (HCC)   Diverticulitis of sigmoid colon s/p robotic colectomy 01/09/2024   Chronic HFrEF (heart failure with reduced ejection fraction) (HCC)   Chronic indwelling Foley catheter   Bipolar affective disorder, depressed (HCC)   Dementia (HCC)   GERD (gastroesophageal reflux disease)   HLD (hyperlipidemia)   OSA on CPAP   Hyperkalemia   Morbid obesity (HCC)   Hydronephrosis, right   History of urinary retention   Ileostomy in place [facility])   Malnutrition of moderate degree   Chronic hyponatremia   Protein-calorie malnutrition, severe   Gastrointestinal hemorrhage with melena   Postoperative anemia due to acute blood loss    Discharge Condition: Improved.  Diet recommendation: Dysphagia 3 Wound care: None.  Code status: Full.   History of Present Illness:   This 72 yrs old Male with PMH significant for CKD stage III A, anemia of chronic disease, hypertension, recurrent UTIs, chronic HFrEF, chronic indwelling Foley catheter, bipolar disorder, dementia, hyperlipidemia, OSA on CPAP, Morbid obesity, history of urinary retention, ileostomy in place, protein calorie malnutrition , hospitalization for colovesical fistula /sigmoid diverticulitis  for which patient underwent robotic rectosigmoid resection with anastomosis, colovesicle fistula takedown and repair, diverting ileostomy and drainage of pelvic abscess and lysis of adhesions in 8/25.[name] He was re admitted at beginning of September this year for sepsis secondary to UTI/gram negative bacteremia and AKI on CKD stage 3a. He presents this admission from his SNF for AMS x 2 days. Found again to be in AKI with metabolic encephalopathy.  On 10/9 he underwent ileostomy takedown.  His hemoglobin steadily declined since surgery.  Patient was seen by GI and underwent EGD which was normal.     Hospital Course by Problem:   Acute kidney injury on CKD stage IIIA: Likely secondary to large volume losses with high output ostomy. Reversal of ostomy completed  on 03/12/24. Creatinine improved and back to baseline.     Sepsis secondary to Klebsiella UTI: Urine culture:  Klebsiella. Completed course of antibiotics. Sepsis resolved.   Acute Encephalopathy secondary to uremia and polypharmacy: Patient remains back to his baseline mental status as renal functions are improving per Calvin Escobar reversal has been completed. Continue Seroquel 25 mg daily and 50 mg at bedtime for agitation.   Chronic HFr EF: Continue Coreg .  He appears euvolemic.   Chronic indwelling Foley catheter: Patient has history of BPH and urethral stricture s/p dilatation. Urology has evaluated the patient,  they recommended outpatient management and have signed off.   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 Patient underwent closure ileostomy 10/9.   Black stools/ Melena: Patient noted to have blackish stools on the sheet. H&H dropped to 7 from 8.6.-Given 1 unit PRBC on 10/13 GI consulted underwent EGD, no obvious source of acute  GI bleed noted.  Likely postop in the setting of ileostomy reversal. -Hemoglobin stable GI signed off.      he has not been using a  cpap in the hospital   Medical Consultants:   GI General surgery   Discharge Exam:   Vitals:   03/19/24 2113 03/20/24 0815  BP: 120/69 122/74  Pulse: 89 100  Resp: 17 17  Temp:  98.6 F (37 C)  SpO2: 98% 98%   Vitals:   03/19/24 0455 03/19/24 0855 03/19/24 2113 03/20/24 0815  BP:  123/77 120/69 122/74  Pulse:  97 89 100  Resp:  16 17 17   Temp:  98.3 F (36.8 C) 98.4 F (36.9 C) 98.6 F (37 C)  TempSrc:  Oral Oral Oral  SpO2:  98% 98% 98%  Weight: 91.2 kg     Height:        General exam: Appears calm and comfortable.    The results of significant diagnostics from this hospitalization (including imaging, microbiology, ancillary and laboratory) are listed below for reference.     Procedures and Diagnostic Studies:   CT ABDOMEN PELVIS WO CONTRAST Result Date: 03/07/2024 CLINICAL DATA:  Abdominal pain, acute, nonlocalized History of colovesical fistula repair, diverting ileostomy, now with AKI and hydronephrosis despite having Foley EXAM: CT ABDOMEN AND PELVIS WITHOUT CONTRAST TECHNIQUE: Multidetector CT imaging of the abdomen and pelvis was performed following the standard protocol without IV contrast. RADIATION DOSE REDUCTION: This exam was performed according to the departmental dose-optimization program which includes automated exposure control, adjustment of the mA and/or kV according to patient size and/or use of iterative reconstruction technique. COMPARISON:  Ultrasound renal 03/06/2024 FINDINGS: Lower chest: Cardiac changes suggestive of anemia. Hepatobiliary: No focal liver abnormality. No gallstones, gallbladder wall thickening, or pericholecystic fluid. No biliary dilatation. Pancreas: Diffusely atrophic. No focal lesion. Otherwise normal pancreatic contour. No surrounding inflammatory changes. No main pancreatic ductal dilatation. Spleen: Normal in size without focal abnormality. Adrenals/Urinary Tract: No adrenal nodule bilaterally. Bilateral moderate  hydroureteronephrosis. No nephroureterolithiasis bilaterally. Decompressed urinary bladder with circumferential urinary bladder wall thickening. Foley catheter in appropriate position. Gas noted within the urinary bladder lumen and left renal calyx (3:31). Stomach/Bowel: Stomach is within normal limits. No evidence of small bowel wall thickening or dilatation. Question slight wall thickening and irregularity of the rectum. Right lower quadrant diverting ileostomy noted. The appendix is not definitely identified with no inflammatory changes in the right lower quadrant to suggest acute appendicitis. Vascular/Lymphatic: No abdominal aorta or iliac aneurysm. Mild atherosclerotic plaque of the aorta and its branches. No abdominal, pelvic, or inguinal lymphadenopathy. Reproductive: Prostate is enlarged measuring up to 4.8 cm. Other: No intraperitoneal free fluid. No intraperitoneal free gas. No organized fluid collection. Musculoskeletal: No abdominal wall hernia or abnormality. No suspicious lytic or blastic osseous lesions. No acute displaced fracture. Multilevel degenerative changes of the spine. IMPRESSION: 1. Circumferential urinary bladder wall thickening with associated bilateral moderate hydroureteronephrosis. Markedly limited evaluation on this noncontrast study. Underlying malignancy or infection are not excluded. 2. Gas within the collecting system may be due to Foley catheter placement. With differential diagnosis including emphysematous pyelitis. 3. Question slight wall thickening and irregularity of the rectum. Markedly limited evaluation on this noncontrast study. Underlying proctitis not excluded. 4. Prostatomegaly. 5. Right lower quadrant diverting ileostomy. 6. Colonic diverticulosis with no acute diverticulitis. 7.  Aortic Atherosclerosis (ICD10-I70.0). Electronically Signed   By: Morgane  Naveau M.D.   On: 03/07/2024 10:47   DG Chest Port 1 View Result Date: 03/07/2024  CLINICAL DATA:  Shortness of  breath. EXAM: PORTABLE CHEST 1 VIEW COMPARISON:  03/06/2024 FINDINGS: Low volume film. Cardiopericardial silhouette is at upper limits of normal for size. Interstitial markings are diffusely coarsened with chronic features. Small nodular density in the peripheral right mid lung is stable. Subtle nodular density in the left mid lung may reflect a confluence of shadows. No dense focal airspace consolidation. No substantial pleural effusion. Skin folds are seen over the upper lungs bilaterally. Telemetry leads overlie the chest. IMPRESSION: 1. Low volume film with chronic interstitial coarsening. 2. Subtle nodular density in the left mid lung may reflect a confluence of shadows. Pneumonia or underlying soft tissue lesion not excluded. Attention on follow-up recommended. 3. Calcified granuloma again noted right lung. Electronically Signed   By: Camellia Candle M.D.   On: 03/07/2024 07:40   US  RENAL Result Date: 03/06/2024 CLINICAL DATA:  [id number] AKI (acute kidney injury) [id number] EXAM: RENAL / URINARY TRACT ULTRASOUND COMPLETE COMPARISON:  02/05/2024, 09/28/2023 FINDINGS: Right Kidney: Renal measurements: 12.1 x 6.1 x 8.2 cm = volume: 313 mL.Normal echogenicity. No mass. Redemonstrated moderate to severe hydroureteronephrosis. No nephrolithiasis. Left Kidney: Renal measurements: 11.8 x 6.5 x 5.8 cm = volume: 234 mL. Normal echogenicity. No mass. Mild hydronephrosis. No nephrolithiasis. Bladder: Similar trabeculation of the urinary bladder wall. Other: None. IMPRESSION: 1. Similarly appearing bilateral hydronephrosis, moderate to severe on the right. 2. Trabeculation of the urinary bladder wall, which may be due to chronic bladder outlet obstruction. Electronically Signed   By: Rogelia Myers M.D.   On: 03/06/2024 17:14   DG Chest Port 1 View Result Date: 03/06/2024 CLINICAL DATA:  Shortness of breath, altered mental status and weakness. EXAM: PORTABLE CHEST 1 VIEW COMPARISON:  [id number] FINDINGS: The heart size and  mediastinal contours are within normal limits. Very low bilateral lung volumes with bibasilar atelectasis. There is no evidence of pulmonary edema, consolidation, pneumothorax or pleural fluid. Stable calcified granuloma in the right lung. The visualized skeletal structures are unremarkable. IMPRESSION: Very low bilateral lung volumes with bibasilar atelectasis. Electronically Signed   By: Marcey Moan M.D.   On: 03/06/2024 14:51     Labs:   Basic Metabolic Panel: Recent Labs  Lab 03/15/24 0524 03/16/24 0727 03/17/24 0421 03/18/24 0517 03/19/24 1151  NA 143 142 140 138 137  K 3.9 4.1 3.7 3.8 4.0  CL 108 109 107 107 106  CO2 18* 21* 20* 21* 23  GLUCOSE 100* 108* 98 101* 101*  BUN 21 22 16 14 12   CREATININE 1.82* 1.87* 1.63* 1.61* 1.54*  CALCIUM  8.0* 8.4* 8.0* 8.1* 8.2*  MG  --   --  1.5* 1.7  --   PHOS  --   --  3.7 3.6  --    GFR Estimated Creatinine Clearance: 45.5 mL/min (A) (by C-G formula based on SCr of 1.54 mg/dL (H)). Liver Function Tests: No results for input(s): AST, ALT, ALKPHOS, BILITOT, PROT, ALBUMIN  in the last 168 hours. No results for input(s): LIPASE, AMYLASE in the last 168 hours. No results for input(s): AMMONIA in the last 168 hours. Coagulation profile No results for input(s): INR, PROTIME in the last 168 hours.  CBC: Recent Labs  Lab 03/14/24 0757 03/15/24 0927 03/16/24 0727 03/17/24 0421 03/18/24 0517 03/19/24 1151  WBC 10.3 8.6 8.1 7.8 7.1 7.0  NEUTROABS 7.9* 6.2 5.3 4.6 4.0  --   HGB 8.1* 7.2* 7.0* 7.6* 7.4* 7.4*  HCT 27.7* 24.8* 23.6* 25.1* 24.5* 24.5*  MCV 89.4 89.2 89.1 88.7  89.7 89.4  PLT 156 158 193 238 277 294   Cardiac Enzymes: No results for input(s): CKTOTAL, CKMB, CKMBINDEX, TROPONINI in the last 168 hours. BNP: Invalid input(s): POCBNP CBG: Recent Labs  Lab 03/14/24 0605 03/15/24 0820  GLUCAP 89 97   D-Dimer No results for input(s): DDIMER in the last 72 hours. Hgb A1c No results  for input(s): HGBA1C in the last 72 hours. Lipid Profile No results for input(s): CHOL, HDL, LDLCALC, TRIG, CHOLHDL, LDLDIRECT in the last 72 hours. Thyroid  function studies No results for input(s): TSH, T4TOTAL, T3FREE, THYROIDAB in the last 72 hours.  Invalid input(s): FREET3 Anemia work up No results for input(s): VITAMINB12, FOLATE, FERRITIN, TIBC, IRON , RETICCTPCT in the last 72 hours. Microbiology Recent Results (from the past 240 hours)  Surgical PCR screen     Status: None   Collection Time: 03/17/24 12:20 AM   Specimen: Nasal Mucosa; Nasal Swab  Result Value Ref Range Status   MRSA, PCR NEGATIVE NEGATIVE Final   Staphylococcus aureus NEGATIVE NEGATIVE Final    Comment: (NOTE) The Xpert SA Assay (FDA approved for NASAL specimens in patients 57 years of age and older), is one component of a comprehensive surveillance program. It is not intended to diagnose infection nor to guide or monitor treatment. Performed at [facility], [address] [place], [place] [address]      Discharge Instructions:   Discharge Instructions     Discharge instructions   Complete by: As directed    DYS3   Increase activity slowly   Complete by: As directed    No wound care   Complete by: As directed       Allergies as of 03/20/2024       Reactions   Albuterol  Palpitations, Other (See Comments)   Supraventricular Tachycardia         Medication List     STOP taking these medications    ARIPiprazole  2 MG tablet Commonly known as: ABILIFY    diphenoxylate-atropine 2.5-0.025 MG tablet Commonly known as: LOMOTIL   traZODone  100 MG tablet Commonly known as: DESYREL        TAKE these medications    busPIRone  10 MG tablet Commonly known as: BUSPAR  Take 1 tablet (10 mg total) by mouth 3 (three) times daily.   carvedilol  3.125 MG tablet Commonly known as: COREG  Take 1 tablet (3.125 mg total) by mouth 2 (two) times daily with a  meal. What changed:  medication strength how much to take when to take this   cyanocobalamin  1000 MCG tablet Take 1 tablet (1,000 mcg total) by mouth daily. Start taking on: March 21, 2024   feeding supplement Liqd Take 237 mLs by mouth 3 (three) times daily between meals.   ferrous sulfate  325 (65 FE) MG tablet Take 1 tablet (325 mg total) by mouth 2 (two) times daily with a meal.   finasteride  5 MG tablet Commonly known as: PROSCAR  Take 5 mg by mouth daily.   folic acid  1 MG tablet Commonly known as: FOLVITE  Take 1 mg by mouth daily.   gabapentin  300 MG capsule Commonly known as: NEURONTIN  Take 300 mg by mouth at bedtime.   HYDROcodone -acetaminophen  5-325 MG tablet Commonly known as: NORCO/VICODIN Take 1 tablet by mouth every 6 (six) hours as needed for moderate pain (pain score 4-6) or severe pain (pain score 7-10).   loperamide  2 MG tablet Commonly known as: IMODIUM  A-D Take 6 mg by mouth 4 (four) times daily.   magic mouthwash Soln  Take 15 mLs by mouth 4 (four) times daily as needed for mouth pain (sore throat). Suspension contains equal amounts of Maalox Extra Strength, nystatin , and diphenhydramine .   melatonin 5 MG Tabs Take 1 tablet (5 mg total) by mouth at bedtime.   multivitamin with minerals Tabs tablet Take 1 tablet by mouth daily. Start taking on: March 21, 2024   omeprazole  40 MG capsule Commonly known as: PRILOSEC Take 40 mg by mouth See admin instructions. Take 40 mg by mouth before breakfast- may be opened and sprinkled on applesauce   ondansetron  4 MG tablet Commonly known as: ZOFRAN  Take 4 mg by mouth every 6 (six) hours as needed for nausea.   polycarbophil 625 MG tablet Commonly known as: FIBERCON Take 1 tablet (625 mg total) by mouth 2 (two) times daily.   pravastatin  20 MG tablet Commonly known as: PRAVACHOL  TAKE 1 TABLET BY MOUTH EVERYDAY AT BEDTIME What changed: See the new instructions.   primidone  50 MG tablet Commonly  known as: MYSOLINE  Take 1 tablet (50 mg total) by mouth 2 (two) times daily. What changed: when to take this   QUEtiapine 50 MG tablet Commonly known as: SEROQUEL Take 1 tablet (50 mg total) by mouth at bedtime.   QUEtiapine 25 MG tablet Commonly known as: SEROQUEL Take 1 tablet (25 mg total) by mouth daily at 6 (six) AM. Start taking on: March 21, 2024   sodium bicarbonate  650 MG tablet Take 2 tablets (1,300 mg total) by mouth 2 (two) times daily.   tamsulosin  0.4 MG Caps capsule Commonly known as: FLOMAX  TAKE 1 CAPSULE BY MOUTH EVERY DAY   venlafaxine  75 MG tablet Commonly known as: EFFEXOR  Take 75 mg by mouth in the morning.   Venlafaxine  HCl 150 MG Tb24 Take 150 mg by mouth in the morning.        Contact information for follow-up providers     Sheldon Standing, MD Follow up in 3 week(s).   Specialties: General Surgery, Colon and Rectal Surgery Why: To follow up after your hospital stay, To follow up after your operation Contact information: [address] [place] [address] [phone]         Carolee Sherwood [name], MD. Schedule an appointment as soon as possible for a visit in 2 week(s).   Specialty: Urology Why: To have your Foley bladder catheter checked & maybe removed Contact information: [address] [place] [address] [phone]              Contact information for after-discharge care     Destination     [name]   Service: Skilled Nursing Contact information: [address] [place]  [address] [phone]                      Time coordinating discharge: 45 min  Signed:  Harlene RAYMOND Bowl DO  [facility] 03/20/2024, 9:15 AM

## 2024-03-21 DIAGNOSIS — S0990XA Unspecified injury of head, initial encounter: Secondary | ICD-10-CM | POA: Diagnosis not present

## 2024-03-23 ENCOUNTER — Ambulatory Visit: Admitting: Internal Medicine

## 2024-03-23 DIAGNOSIS — K5732 Diverticulitis of large intestine without perforation or abscess without bleeding: Secondary | ICD-10-CM | POA: Diagnosis not present

## 2024-03-23 DIAGNOSIS — N390 Urinary tract infection, site not specified: Secondary | ICD-10-CM | POA: Diagnosis not present

## 2024-03-23 DIAGNOSIS — D631 Anemia in chronic kidney disease: Secondary | ICD-10-CM | POA: Diagnosis not present

## 2024-03-23 DIAGNOSIS — Z6832 Body mass index (BMI) 32.0-32.9, adult: Secondary | ICD-10-CM | POA: Diagnosis not present

## 2024-03-23 DIAGNOSIS — N179 Acute kidney failure, unspecified: Secondary | ICD-10-CM | POA: Diagnosis not present

## 2024-03-23 DIAGNOSIS — N1831 Chronic kidney disease, stage 3a: Secondary | ICD-10-CM | POA: Diagnosis not present

## 2024-03-23 DIAGNOSIS — N321 Vesicointestinal fistula: Secondary | ICD-10-CM | POA: Diagnosis not present

## 2024-03-23 DIAGNOSIS — K922 Gastrointestinal hemorrhage, unspecified: Secondary | ICD-10-CM | POA: Diagnosis not present

## 2024-03-23 DIAGNOSIS — I5022 Chronic systolic (congestive) heart failure: Secondary | ICD-10-CM | POA: Diagnosis not present

## 2024-03-24 ENCOUNTER — Encounter: Payer: Self-pay | Admitting: Internal Medicine

## 2024-03-24 DIAGNOSIS — K922 Gastrointestinal hemorrhage, unspecified: Secondary | ICD-10-CM | POA: Diagnosis not present

## 2024-03-24 DIAGNOSIS — D631 Anemia in chronic kidney disease: Secondary | ICD-10-CM | POA: Diagnosis not present

## 2024-03-24 DIAGNOSIS — N179 Acute kidney failure, unspecified: Secondary | ICD-10-CM | POA: Diagnosis not present

## 2024-03-24 DIAGNOSIS — N390 Urinary tract infection, site not specified: Secondary | ICD-10-CM | POA: Diagnosis not present

## 2024-03-24 DIAGNOSIS — K5732 Diverticulitis of large intestine without perforation or abscess without bleeding: Secondary | ICD-10-CM | POA: Diagnosis not present

## 2024-03-24 DIAGNOSIS — D638 Anemia in other chronic diseases classified elsewhere: Secondary | ICD-10-CM | POA: Diagnosis not present

## 2024-03-24 DIAGNOSIS — N321 Vesicointestinal fistula: Secondary | ICD-10-CM | POA: Diagnosis not present

## 2024-03-24 DIAGNOSIS — Z6832 Body mass index (BMI) 32.0-32.9, adult: Secondary | ICD-10-CM | POA: Diagnosis not present

## 2024-03-24 DIAGNOSIS — E785 Hyperlipidemia, unspecified: Secondary | ICD-10-CM | POA: Diagnosis not present

## 2024-03-24 DIAGNOSIS — E871 Hypo-osmolality and hyponatremia: Secondary | ICD-10-CM | POA: Diagnosis not present

## 2024-03-24 DIAGNOSIS — A419 Sepsis, unspecified organism: Secondary | ICD-10-CM | POA: Diagnosis not present

## 2024-03-24 DIAGNOSIS — N1831 Chronic kidney disease, stage 3a: Secondary | ICD-10-CM | POA: Diagnosis not present

## 2024-03-24 DIAGNOSIS — I5022 Chronic systolic (congestive) heart failure: Secondary | ICD-10-CM | POA: Diagnosis not present

## 2024-03-24 NOTE — Progress Notes (Signed)
 Care Guide Pharmacy Note  03/24/2024 Name: Adonias Demore MRN: [id number] DOB: 09-03-1950  Referred By: [name], MD Reason for referral: Call Attempt #1 and Complex Care Management (Outreach to schedule referral with pharmacist )   [name] is a 73 y.o. year old male who is a primary care patient of [name], MD.  [name] was referred to the pharmacist for assistance related to: med management   Per pt wife - pt in rehab with no d/c date set yet - contact info given if services needed in the future   Thedford Franks, CMA [facility]  [facility] Guide Direct Dial: [phone]  Fax: [phone] Website: [email or web address]

## 2024-03-25 DIAGNOSIS — N390 Urinary tract infection, site not specified: Secondary | ICD-10-CM | POA: Diagnosis not present

## 2024-03-25 DIAGNOSIS — R41841 Cognitive communication deficit: Secondary | ICD-10-CM | POA: Diagnosis not present

## 2024-03-25 DIAGNOSIS — D631 Anemia in chronic kidney disease: Secondary | ICD-10-CM | POA: Diagnosis not present

## 2024-03-25 DIAGNOSIS — K922 Gastrointestinal hemorrhage, unspecified: Secondary | ICD-10-CM | POA: Diagnosis not present

## 2024-03-25 DIAGNOSIS — I5022 Chronic systolic (congestive) heart failure: Secondary | ICD-10-CM | POA: Diagnosis not present

## 2024-03-25 DIAGNOSIS — N1831 Chronic kidney disease, stage 3a: Secondary | ICD-10-CM | POA: Diagnosis not present

## 2024-03-25 DIAGNOSIS — K5732 Diverticulitis of large intestine without perforation or abscess without bleeding: Secondary | ICD-10-CM | POA: Diagnosis not present

## 2024-03-25 DIAGNOSIS — N321 Vesicointestinal fistula: Secondary | ICD-10-CM | POA: Diagnosis not present

## 2024-03-25 DIAGNOSIS — R2681 Unsteadiness on feet: Secondary | ICD-10-CM | POA: Diagnosis not present

## 2024-03-26 ENCOUNTER — Other Ambulatory Visit (HOSPITAL_COMMUNITY)

## 2024-03-26 DIAGNOSIS — I5022 Chronic systolic (congestive) heart failure: Secondary | ICD-10-CM | POA: Diagnosis not present

## 2024-03-26 DIAGNOSIS — N1831 Chronic kidney disease, stage 3a: Secondary | ICD-10-CM | POA: Diagnosis not present

## 2024-03-26 DIAGNOSIS — R41841 Cognitive communication deficit: Secondary | ICD-10-CM | POA: Diagnosis not present

## 2024-03-26 DIAGNOSIS — K922 Gastrointestinal hemorrhage, unspecified: Secondary | ICD-10-CM | POA: Diagnosis not present

## 2024-03-26 DIAGNOSIS — N321 Vesicointestinal fistula: Secondary | ICD-10-CM | POA: Diagnosis not present

## 2024-03-26 DIAGNOSIS — N390 Urinary tract infection, site not specified: Secondary | ICD-10-CM | POA: Diagnosis not present

## 2024-03-26 DIAGNOSIS — K5732 Diverticulitis of large intestine without perforation or abscess without bleeding: Secondary | ICD-10-CM | POA: Diagnosis not present

## 2024-03-26 DIAGNOSIS — R2681 Unsteadiness on feet: Secondary | ICD-10-CM | POA: Diagnosis not present

## 2024-03-26 DIAGNOSIS — D631 Anemia in chronic kidney disease: Secondary | ICD-10-CM | POA: Diagnosis not present

## 2024-03-27 DIAGNOSIS — I5022 Chronic systolic (congestive) heart failure: Secondary | ICD-10-CM | POA: Diagnosis not present

## 2024-03-27 DIAGNOSIS — K922 Gastrointestinal hemorrhage, unspecified: Secondary | ICD-10-CM | POA: Diagnosis not present

## 2024-03-27 DIAGNOSIS — N321 Vesicointestinal fistula: Secondary | ICD-10-CM | POA: Diagnosis not present

## 2024-03-27 DIAGNOSIS — N390 Urinary tract infection, site not specified: Secondary | ICD-10-CM | POA: Diagnosis not present

## 2024-03-27 DIAGNOSIS — N1831 Chronic kidney disease, stage 3a: Secondary | ICD-10-CM | POA: Diagnosis not present

## 2024-03-27 DIAGNOSIS — R41841 Cognitive communication deficit: Secondary | ICD-10-CM | POA: Diagnosis not present

## 2024-03-27 DIAGNOSIS — K5732 Diverticulitis of large intestine without perforation or abscess without bleeding: Secondary | ICD-10-CM | POA: Diagnosis not present

## 2024-03-27 DIAGNOSIS — R2681 Unsteadiness on feet: Secondary | ICD-10-CM | POA: Diagnosis not present

## 2024-03-27 DIAGNOSIS — D631 Anemia in chronic kidney disease: Secondary | ICD-10-CM | POA: Diagnosis not present

## 2024-03-28 DIAGNOSIS — K922 Gastrointestinal hemorrhage, unspecified: Secondary | ICD-10-CM | POA: Diagnosis not present

## 2024-03-28 DIAGNOSIS — N390 Urinary tract infection, site not specified: Secondary | ICD-10-CM | POA: Diagnosis not present

## 2024-03-28 DIAGNOSIS — D631 Anemia in chronic kidney disease: Secondary | ICD-10-CM | POA: Diagnosis not present

## 2024-03-28 DIAGNOSIS — R41841 Cognitive communication deficit: Secondary | ICD-10-CM | POA: Diagnosis not present

## 2024-03-28 DIAGNOSIS — K5732 Diverticulitis of large intestine without perforation or abscess without bleeding: Secondary | ICD-10-CM | POA: Diagnosis not present

## 2024-03-28 DIAGNOSIS — I5022 Chronic systolic (congestive) heart failure: Secondary | ICD-10-CM | POA: Diagnosis not present

## 2024-03-28 DIAGNOSIS — R2681 Unsteadiness on feet: Secondary | ICD-10-CM | POA: Diagnosis not present

## 2024-03-28 DIAGNOSIS — N1831 Chronic kidney disease, stage 3a: Secondary | ICD-10-CM | POA: Diagnosis not present

## 2024-03-28 DIAGNOSIS — N321 Vesicointestinal fistula: Secondary | ICD-10-CM | POA: Diagnosis not present

## 2024-03-29 DIAGNOSIS — N3000 Acute cystitis without hematuria: Secondary | ICD-10-CM | POA: Diagnosis not present

## 2024-03-29 DIAGNOSIS — N1831 Chronic kidney disease, stage 3a: Secondary | ICD-10-CM | POA: Diagnosis not present

## 2024-03-29 DIAGNOSIS — E785 Hyperlipidemia, unspecified: Secondary | ICD-10-CM | POA: Diagnosis not present

## 2024-03-29 DIAGNOSIS — K219 Gastro-esophageal reflux disease without esophagitis: Secondary | ICD-10-CM | POA: Diagnosis not present

## 2024-03-29 DIAGNOSIS — R41841 Cognitive communication deficit: Secondary | ICD-10-CM | POA: Diagnosis not present

## 2024-03-29 DIAGNOSIS — M6281 Muscle weakness (generalized): Secondary | ICD-10-CM | POA: Diagnosis not present

## 2024-03-29 DIAGNOSIS — R262 Difficulty in walking, not elsewhere classified: Secondary | ICD-10-CM | POA: Diagnosis not present

## 2024-03-29 DIAGNOSIS — D638 Anemia in other chronic diseases classified elsewhere: Secondary | ICD-10-CM | POA: Diagnosis not present

## 2024-03-29 DIAGNOSIS — N400 Enlarged prostate without lower urinary tract symptoms: Secondary | ICD-10-CM | POA: Diagnosis not present

## 2024-03-30 DIAGNOSIS — K922 Gastrointestinal hemorrhage, unspecified: Secondary | ICD-10-CM | POA: Diagnosis not present

## 2024-03-30 DIAGNOSIS — D631 Anemia in chronic kidney disease: Secondary | ICD-10-CM | POA: Diagnosis not present

## 2024-03-30 DIAGNOSIS — I5022 Chronic systolic (congestive) heart failure: Secondary | ICD-10-CM | POA: Diagnosis not present

## 2024-03-30 DIAGNOSIS — N1831 Chronic kidney disease, stage 3a: Secondary | ICD-10-CM | POA: Diagnosis not present

## 2024-03-30 DIAGNOSIS — R41841 Cognitive communication deficit: Secondary | ICD-10-CM | POA: Diagnosis not present

## 2024-03-30 DIAGNOSIS — N321 Vesicointestinal fistula: Secondary | ICD-10-CM | POA: Diagnosis not present

## 2024-03-30 DIAGNOSIS — N390 Urinary tract infection, site not specified: Secondary | ICD-10-CM | POA: Diagnosis not present

## 2024-03-30 DIAGNOSIS — R2681 Unsteadiness on feet: Secondary | ICD-10-CM | POA: Diagnosis not present

## 2024-03-30 DIAGNOSIS — K5732 Diverticulitis of large intestine without perforation or abscess without bleeding: Secondary | ICD-10-CM | POA: Diagnosis not present

## 2024-03-31 DIAGNOSIS — I5022 Chronic systolic (congestive) heart failure: Secondary | ICD-10-CM | POA: Diagnosis not present

## 2024-03-31 DIAGNOSIS — N321 Vesicointestinal fistula: Secondary | ICD-10-CM | POA: Diagnosis not present

## 2024-03-31 DIAGNOSIS — N390 Urinary tract infection, site not specified: Secondary | ICD-10-CM | POA: Diagnosis not present

## 2024-03-31 DIAGNOSIS — R41841 Cognitive communication deficit: Secondary | ICD-10-CM | POA: Diagnosis not present

## 2024-03-31 DIAGNOSIS — N1831 Chronic kidney disease, stage 3a: Secondary | ICD-10-CM | POA: Diagnosis not present

## 2024-03-31 DIAGNOSIS — K922 Gastrointestinal hemorrhage, unspecified: Secondary | ICD-10-CM | POA: Diagnosis not present

## 2024-03-31 DIAGNOSIS — R2681 Unsteadiness on feet: Secondary | ICD-10-CM | POA: Diagnosis not present

## 2024-03-31 DIAGNOSIS — D631 Anemia in chronic kidney disease: Secondary | ICD-10-CM | POA: Diagnosis not present

## 2024-03-31 DIAGNOSIS — K5732 Diverticulitis of large intestine without perforation or abscess without bleeding: Secondary | ICD-10-CM | POA: Diagnosis not present

## 2024-04-01 DIAGNOSIS — I5022 Chronic systolic (congestive) heart failure: Secondary | ICD-10-CM | POA: Diagnosis not present

## 2024-04-01 DIAGNOSIS — R2681 Unsteadiness on feet: Secondary | ICD-10-CM | POA: Diagnosis not present

## 2024-04-01 DIAGNOSIS — N390 Urinary tract infection, site not specified: Secondary | ICD-10-CM | POA: Diagnosis not present

## 2024-04-01 DIAGNOSIS — N321 Vesicointestinal fistula: Secondary | ICD-10-CM | POA: Diagnosis not present

## 2024-04-01 DIAGNOSIS — D631 Anemia in chronic kidney disease: Secondary | ICD-10-CM | POA: Diagnosis not present

## 2024-04-01 DIAGNOSIS — K922 Gastrointestinal hemorrhage, unspecified: Secondary | ICD-10-CM | POA: Diagnosis not present

## 2024-04-01 DIAGNOSIS — N1831 Chronic kidney disease, stage 3a: Secondary | ICD-10-CM | POA: Diagnosis not present

## 2024-04-01 DIAGNOSIS — R41841 Cognitive communication deficit: Secondary | ICD-10-CM | POA: Diagnosis not present

## 2024-04-01 DIAGNOSIS — K5732 Diverticulitis of large intestine without perforation or abscess without bleeding: Secondary | ICD-10-CM | POA: Diagnosis not present

## 2024-04-02 DIAGNOSIS — R2681 Unsteadiness on feet: Secondary | ICD-10-CM | POA: Diagnosis not present

## 2024-04-02 DIAGNOSIS — N1831 Chronic kidney disease, stage 3a: Secondary | ICD-10-CM | POA: Diagnosis not present

## 2024-04-02 DIAGNOSIS — K5732 Diverticulitis of large intestine without perforation or abscess without bleeding: Secondary | ICD-10-CM | POA: Diagnosis not present

## 2024-04-02 DIAGNOSIS — N321 Vesicointestinal fistula: Secondary | ICD-10-CM | POA: Diagnosis not present

## 2024-04-02 DIAGNOSIS — D631 Anemia in chronic kidney disease: Secondary | ICD-10-CM | POA: Diagnosis not present

## 2024-04-02 DIAGNOSIS — R41841 Cognitive communication deficit: Secondary | ICD-10-CM | POA: Diagnosis not present

## 2024-04-02 DIAGNOSIS — I5022 Chronic systolic (congestive) heart failure: Secondary | ICD-10-CM | POA: Diagnosis not present

## 2024-04-02 DIAGNOSIS — N390 Urinary tract infection, site not specified: Secondary | ICD-10-CM | POA: Diagnosis not present

## 2024-04-02 DIAGNOSIS — K922 Gastrointestinal hemorrhage, unspecified: Secondary | ICD-10-CM | POA: Diagnosis not present

## 2024-04-03 DIAGNOSIS — D631 Anemia in chronic kidney disease: Secondary | ICD-10-CM | POA: Diagnosis not present

## 2024-04-03 DIAGNOSIS — N390 Urinary tract infection, site not specified: Secondary | ICD-10-CM | POA: Diagnosis not present

## 2024-04-03 DIAGNOSIS — R2681 Unsteadiness on feet: Secondary | ICD-10-CM | POA: Diagnosis not present

## 2024-04-03 DIAGNOSIS — R41841 Cognitive communication deficit: Secondary | ICD-10-CM | POA: Diagnosis not present

## 2024-04-03 DIAGNOSIS — K5732 Diverticulitis of large intestine without perforation or abscess without bleeding: Secondary | ICD-10-CM | POA: Diagnosis not present

## 2024-04-03 DIAGNOSIS — N1831 Chronic kidney disease, stage 3a: Secondary | ICD-10-CM | POA: Diagnosis not present

## 2024-04-03 DIAGNOSIS — I5022 Chronic systolic (congestive) heart failure: Secondary | ICD-10-CM | POA: Diagnosis not present

## 2024-04-03 DIAGNOSIS — N321 Vesicointestinal fistula: Secondary | ICD-10-CM | POA: Diagnosis not present

## 2024-04-03 DIAGNOSIS — K922 Gastrointestinal hemorrhage, unspecified: Secondary | ICD-10-CM | POA: Diagnosis not present

## 2024-04-05 DIAGNOSIS — D631 Anemia in chronic kidney disease: Secondary | ICD-10-CM | POA: Diagnosis not present

## 2024-04-05 DIAGNOSIS — F0390 Unspecified dementia without behavioral disturbance: Secondary | ICD-10-CM | POA: Diagnosis not present

## 2024-04-05 DIAGNOSIS — K5732 Diverticulitis of large intestine without perforation or abscess without bleeding: Secondary | ICD-10-CM | POA: Diagnosis not present

## 2024-04-05 DIAGNOSIS — N1831 Chronic kidney disease, stage 3a: Secondary | ICD-10-CM | POA: Diagnosis not present

## 2024-04-05 DIAGNOSIS — I129 Hypertensive chronic kidney disease with stage 1 through stage 4 chronic kidney disease, or unspecified chronic kidney disease: Secondary | ICD-10-CM | POA: Diagnosis not present

## 2024-04-05 DIAGNOSIS — T83511D Infection and inflammatory reaction due to indwelling urethral catheter, subsequent encounter: Secondary | ICD-10-CM | POA: Diagnosis not present

## 2024-04-05 DIAGNOSIS — N321 Vesicointestinal fistula: Secondary | ICD-10-CM | POA: Diagnosis not present

## 2024-04-05 DIAGNOSIS — R8290 Unspecified abnormal findings in urine: Secondary | ICD-10-CM | POA: Diagnosis not present

## 2024-04-05 DIAGNOSIS — I5022 Chronic systolic (congestive) heart failure: Secondary | ICD-10-CM | POA: Diagnosis not present

## 2024-04-05 DIAGNOSIS — I110 Hypertensive heart disease with heart failure: Secondary | ICD-10-CM | POA: Diagnosis not present

## 2024-04-06 DIAGNOSIS — N321 Vesicointestinal fistula: Secondary | ICD-10-CM | POA: Diagnosis not present

## 2024-04-06 DIAGNOSIS — I5022 Chronic systolic (congestive) heart failure: Secondary | ICD-10-CM | POA: Diagnosis not present

## 2024-04-06 DIAGNOSIS — K5732 Diverticulitis of large intestine without perforation or abscess without bleeding: Secondary | ICD-10-CM | POA: Diagnosis not present

## 2024-04-06 DIAGNOSIS — N1831 Chronic kidney disease, stage 3a: Secondary | ICD-10-CM | POA: Diagnosis not present

## 2024-04-06 DIAGNOSIS — D631 Anemia in chronic kidney disease: Secondary | ICD-10-CM | POA: Diagnosis not present

## 2024-04-06 DIAGNOSIS — K922 Gastrointestinal hemorrhage, unspecified: Secondary | ICD-10-CM | POA: Diagnosis not present

## 2024-04-06 DIAGNOSIS — R2681 Unsteadiness on feet: Secondary | ICD-10-CM | POA: Diagnosis not present

## 2024-04-06 DIAGNOSIS — R41841 Cognitive communication deficit: Secondary | ICD-10-CM | POA: Diagnosis not present

## 2024-04-06 DIAGNOSIS — N390 Urinary tract infection, site not specified: Secondary | ICD-10-CM | POA: Diagnosis not present

## 2024-04-07 DIAGNOSIS — F0390 Unspecified dementia without behavioral disturbance: Secondary | ICD-10-CM | POA: Diagnosis not present

## 2024-04-07 DIAGNOSIS — N321 Vesicointestinal fistula: Secondary | ICD-10-CM | POA: Diagnosis not present

## 2024-04-07 DIAGNOSIS — I5022 Chronic systolic (congestive) heart failure: Secondary | ICD-10-CM | POA: Diagnosis not present

## 2024-04-07 DIAGNOSIS — D631 Anemia in chronic kidney disease: Secondary | ICD-10-CM | POA: Diagnosis not present

## 2024-04-07 DIAGNOSIS — I129 Hypertensive chronic kidney disease with stage 1 through stage 4 chronic kidney disease, or unspecified chronic kidney disease: Secondary | ICD-10-CM | POA: Diagnosis not present

## 2024-04-07 DIAGNOSIS — N1831 Chronic kidney disease, stage 3a: Secondary | ICD-10-CM | POA: Diagnosis not present

## 2024-04-07 DIAGNOSIS — T83511D Infection and inflammatory reaction due to indwelling urethral catheter, subsequent encounter: Secondary | ICD-10-CM | POA: Diagnosis not present

## 2024-04-07 DIAGNOSIS — E785 Hyperlipidemia, unspecified: Secondary | ICD-10-CM | POA: Diagnosis not present

## 2024-04-07 DIAGNOSIS — E871 Hypo-osmolality and hyponatremia: Secondary | ICD-10-CM | POA: Diagnosis not present

## 2024-04-07 DIAGNOSIS — K5732 Diverticulitis of large intestine without perforation or abscess without bleeding: Secondary | ICD-10-CM | POA: Diagnosis not present

## 2024-04-07 DIAGNOSIS — D638 Anemia in other chronic diseases classified elsewhere: Secondary | ICD-10-CM | POA: Diagnosis not present

## 2024-04-07 DIAGNOSIS — A419 Sepsis, unspecified organism: Secondary | ICD-10-CM | POA: Diagnosis not present

## 2024-04-07 DIAGNOSIS — I110 Hypertensive heart disease with heart failure: Secondary | ICD-10-CM | POA: Diagnosis not present

## 2024-04-08 DIAGNOSIS — D631 Anemia in chronic kidney disease: Secondary | ICD-10-CM | POA: Diagnosis not present

## 2024-04-08 DIAGNOSIS — I110 Hypertensive heart disease with heart failure: Secondary | ICD-10-CM | POA: Diagnosis not present

## 2024-04-08 DIAGNOSIS — I5022 Chronic systolic (congestive) heart failure: Secondary | ICD-10-CM | POA: Diagnosis not present

## 2024-04-08 DIAGNOSIS — N321 Vesicointestinal fistula: Secondary | ICD-10-CM | POA: Diagnosis not present

## 2024-04-08 DIAGNOSIS — I129 Hypertensive chronic kidney disease with stage 1 through stage 4 chronic kidney disease, or unspecified chronic kidney disease: Secondary | ICD-10-CM | POA: Diagnosis not present

## 2024-04-08 DIAGNOSIS — N1831 Chronic kidney disease, stage 3a: Secondary | ICD-10-CM | POA: Diagnosis not present

## 2024-04-08 DIAGNOSIS — F0390 Unspecified dementia without behavioral disturbance: Secondary | ICD-10-CM | POA: Diagnosis not present

## 2024-04-08 DIAGNOSIS — K5732 Diverticulitis of large intestine without perforation or abscess without bleeding: Secondary | ICD-10-CM | POA: Diagnosis not present

## 2024-04-08 DIAGNOSIS — T83511D Infection and inflammatory reaction due to indwelling urethral catheter, subsequent encounter: Secondary | ICD-10-CM | POA: Diagnosis not present

## 2024-04-10 DIAGNOSIS — I129 Hypertensive chronic kidney disease with stage 1 through stage 4 chronic kidney disease, or unspecified chronic kidney disease: Secondary | ICD-10-CM | POA: Diagnosis not present

## 2024-04-10 DIAGNOSIS — N1831 Chronic kidney disease, stage 3a: Secondary | ICD-10-CM | POA: Diagnosis not present

## 2024-04-10 DIAGNOSIS — F0390 Unspecified dementia without behavioral disturbance: Secondary | ICD-10-CM | POA: Diagnosis not present

## 2024-04-10 DIAGNOSIS — K5732 Diverticulitis of large intestine without perforation or abscess without bleeding: Secondary | ICD-10-CM | POA: Diagnosis not present

## 2024-04-10 DIAGNOSIS — T83511D Infection and inflammatory reaction due to indwelling urethral catheter, subsequent encounter: Secondary | ICD-10-CM | POA: Diagnosis not present

## 2024-04-10 DIAGNOSIS — I5022 Chronic systolic (congestive) heart failure: Secondary | ICD-10-CM | POA: Diagnosis not present

## 2024-04-10 DIAGNOSIS — I110 Hypertensive heart disease with heart failure: Secondary | ICD-10-CM | POA: Diagnosis not present

## 2024-04-10 DIAGNOSIS — N321 Vesicointestinal fistula: Secondary | ICD-10-CM | POA: Diagnosis not present

## 2024-04-10 DIAGNOSIS — D631 Anemia in chronic kidney disease: Secondary | ICD-10-CM | POA: Diagnosis not present

## 2024-04-13 ENCOUNTER — Ambulatory Visit (INDEPENDENT_AMBULATORY_CARE_PROVIDER_SITE_OTHER): Admitting: Internal Medicine

## 2024-04-13 ENCOUNTER — Encounter: Payer: Self-pay | Admitting: Internal Medicine

## 2024-04-13 VITALS — BP 160/80 | HR 114 | Temp 98.1°F | Ht 72.0 in | Wt 194.8 lb

## 2024-04-13 DIAGNOSIS — G4709 Other insomnia: Secondary | ICD-10-CM

## 2024-04-13 DIAGNOSIS — F3131 Bipolar disorder, current episode depressed, mild: Secondary | ICD-10-CM

## 2024-04-13 DIAGNOSIS — N1831 Chronic kidney disease, stage 3a: Secondary | ICD-10-CM

## 2024-04-13 DIAGNOSIS — E43 Unspecified severe protein-calorie malnutrition: Secondary | ICD-10-CM

## 2024-04-13 DIAGNOSIS — N35119 Postinfective urethral stricture, not elsewhere classified, male, unspecified: Secondary | ICD-10-CM

## 2024-04-13 DIAGNOSIS — R413 Other amnesia: Secondary | ICD-10-CM | POA: Diagnosis not present

## 2024-04-13 MED ORDER — BELSOMRA 5 MG PO TABS: 5.0000 mg | ORAL_TABLET | Freq: Every evening | ORAL | 2 refills | Status: DC | PRN

## 2024-04-13 MED ORDER — HYDROCODONE-ACETAMINOPHEN 5-325 MG PO TABS: 1.0000 | ORAL_TABLET | Freq: Four times a day (QID) | ORAL | 0 refills | Status: DC | PRN

## 2024-04-13 NOTE — Assessment & Plan Note (Signed)
 Protein-calorie malnutrition is likely secondary to decreased appetite and weight loss, possibly related to sleep deprivation and discomfort from the Foley catheter. Encourage nutritional intake and monitor weight.

## 2024-04-13 NOTE — Progress Notes (Signed)
 ==============================  [facility]: [phone]   -- Medical Office Visit --  Patient: Calvin Escobar      Age: 73 y.o.       Sex:  male  Date:   04/13/2024 Today's Healthcare Provider: Bernardino KANDICE Cone, MD  ==============================   Chief Complaint: Anxiety and Depression Wants foley out Memory/cognition is less than prior to hospitalization, but not actively changing  Discussed the use of AI scribe software for clinical note transcription with the patient, who gave verbal consent to proceed.  History of Present Illness 73 year old male with urethral stricture and recurrent urinary tract infections who presents with discomfort from a Foley catheter.  He has been experiencing significant discomfort due to a Foley catheter, which was placed because of a urethral stricture. Since then, he has had multiple hospitalizations, with the most recent discharge from a rehab facility on October 17th. He describes soreness and burning sensations associated with the catheter, which he finds extremely distressing. The discomfort is impacting his sleep, contributing to sleep deprivation and cognitive difficulties.  He has a history of recurrent urinary tract infections, complicated by fungal infections, leading to scarring and the development of a urethral stricture. This condition necessitated the use of a Foley catheter. He reports that the catheter is causing significant discomfort and impacting his sleep, and he feels this is contributing to his current state of sleep deprivation and cognitive difficulties.  His medication regimen includes Cipro for a potential prostate infection, Abilify , and venlafaxine  for mental health management. There is some confusion regarding the dosages of venlafaxine , with both 75 mg and 150 mg tablets present. His caregiver is managing his medications and is concerned about ensuring the correct dosages are  administered.  He has been experiencing poor sleep, which is exacerbating his cognitive issues. He reports not being able to sleep much, and his caregiver notes that he frequently asks repetitive questions, indicating possible cognitive decline due to sleep deprivation. He does not report hallucinations, but his caregiver notes he is unable to recall certain details, such as the provider's name, which is unusual for him.  He has a history of an ileostomy, which has been reversed, and he reports no current issues with bowel movements. He has lost weight recently and has a decreased appetite, which he attributes to his inability to sleep and overall discomfort.  Background Reviewed: Problem List: has Bipolar affective disorder, depressed (HCC); Chronic bronchitis (HCC); Dementia (HCC); HLD (hyperlipidemia); Hyperkalemia; DCM (dilated cardiomyopathy) (HCC); OSA treated with BiPAP; Ascending aorta dilatation; Alcoholism in remission (HCC); BPH (benign prostatic hyperplasia); Primary insomnia; Generalized arthritis; Morbid obesity (HCC); Hypertension; Limp; Gynecomastia, male; Chronic back pain; Chronic kidney disease, stage 3a (HCC); Tremor; Lack of appetite; Anemia of chronic disease; Acute kidney injury superimposed on chronic kidney disease; LBBB (left bundle branch block); Colovesical fistula; Recurrent UTI; GERD (gastroesophageal reflux disease); Weight loss, abnormal; Hypocalcemia; Hypoalbuminemia; Hydronephrosis, right; Prostatic mass; Medication management; Hematuria; Obesity (BMI 30-39.9); Chronic HFrEF (heart failure with reduced ejection fraction) (HCC); Stricture of male urethra; History of COVID-19; History of urinary retention; Chronic indwelling Foley catheter; Ileostomy in place [facility]); Malnutrition of moderate degree; OSA on CPAP; Diverticulitis of sigmoid colon s/p robotic colectomy 01/09/2024; Acute kidney injury; Acute kidney injury superimposed on stage 3a chronic kidney disease (HCC) with  hyperkalemia; Acute encephalopathy; Chronic hyponatremia; Protein-calorie malnutrition, severe; Gastrointestinal hemorrhage with melena; and Postoperative anemia due to acute blood loss on their problem list. Past Medical History:  has  a past medical history of Acute cystitis (05/13/2023), Acute hyponatremia (05/13/2023), Acute kidney injury superimposed on chronic kidney disease (05/13/2023), Acute metabolic encephalopathy (07/12/2023), Acute respiratory failure with hypoxia (HCC) (06/02/2023), AKI (acute kidney injury) (05/13/2023), Allergy (seasonal), Anxiety (12/02/2023), Ascending aorta dilatation, C. 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), COVID-19 (12/11/2023), DCM (dilated cardiomyopathy) (HCC), Dementia (HCC), Depression, Diarrhea (05/13/2023), E coli bacteremia (05/14/2023), High anion gap metabolic acidosis (07/13/2023), History of Clostridioides difficile colitis (08/15/2023), Hyperkalemia, Hyperlipidemia, Hypertension, Leukocytosis (12/11/2023), Lithium  toxicity (01/29/2016), OSA treated with BiPAP, PAC (premature atrial contraction) (07/17/2019), Pneumonia, PUD (peptic ulcer disease) (01/12/2015), Septic shock from UTI (06/02/2023), Severe sepsis (HCC) (06/03/2023), SIRS (systemic inflammatory response syndrome) (HCC) (12/11/2023), SKIN CANCER, HX OF (05/10/2007), Syncope (05/13/2023), Thrombocytopenia (06/02/2023), Urinary catheter complication (06/24/2023), Urinary dribbling (03/14/2022), Urinary incontinence (11/21/2022), Urinary tract infection without hematuria (06/02/2023), and UTI (urinary tract infection) (07/01/2023). Past Surgical History:   has a past surgical history that includes Colonoscopy; RIGHT/LEFT HEART CATH AND CORONARY  ANGIOGRAPHY (N/A, 07/03/2019); Colonoscopy (N/A, 12/05/2023); Colectomy, sigmoid, robot-assisted (N/A, 01/09/2024); Laparoscopic loop colostomy (N/A, 01/09/2024); Flexible sigmoidoscopy (N/A, 01/09/2024); Robotic assisted laparoscopic lysis of adhesion (01/09/2024); Cystoscopy with indocyanine green  imaging (icg) (N/A, 01/09/2024); Ileostomy closure (N/A, 03/12/2024); Rectal exam under anesthesia (N/A, 03/12/2024); and Esophagogastroduodenoscopy (N/A, 03/17/2024). Social History:   reports that he has never smoked. He has never used smokeless tobacco. He reports that he does not currently use alcohol. He reports that he does not use drugs. Family History:  family history includes Bone cancer in his sister; Cancer in his father; Hypertension in his mother. Allergies:  is allergic to albuterol .   Medication Reconciliation: Current Outpatient Medications on File Prior to Visit  Medication Sig   busPIRone  (BUSPAR ) 10 MG tablet Take 1 tablet (10 mg total) by mouth 3 (three) times daily.   carvedilol  (COREG ) 3.125 MG tablet Take 1 tablet (3.125 mg total) by mouth 2 (two) times daily with a meal.   feeding supplement (ENSURE PLUS HIGH PROTEIN) LIQD Take 237 mLs by mouth 3 (three) times daily between meals.   finasteride  (PROSCAR ) 5 MG tablet Take 5 mg by mouth daily.   folic acid  (FOLVITE ) 1 MG tablet Take 1 mg by mouth daily.   gabapentin  (NEURONTIN ) 300 MG capsule Take 300 mg by mouth at bedtime.   loperamide  (IMODIUM  A-D) 2 MG tablet Take 6 mg by mouth 4 (four) times daily.   melatonin 5 MG TABS Take 1 tablet (5 mg total) by mouth at bedtime.   Multiple Vitamin (MULTIVITAMIN WITH MINERALS) TABS tablet Take 1 tablet by mouth daily.   omeprazole  (PRILOSEC) 40 MG capsule Take 40 mg by mouth See admin instructions. Take 40 mg by mouth before breakfast- may be opened and sprinkled on applesauce   ondansetron  (ZOFRAN ) 4 MG tablet Take 4 mg by mouth every 6 (six) hours as needed for nausea.   polycarbophil (FIBERCON) 625  MG tablet Take 1 tablet (625 mg total) by mouth 2 (two) times daily.   pravastatin  (PRAVACHOL ) 20 MG tablet TAKE 1 TABLET BY MOUTH EVERYDAY AT BEDTIME (Patient taking differently: Take 20 mg by mouth at bedtime.)   sodium bicarbonate  650 MG tablet Take 2 tablets (1,300 mg total) by mouth 2 (two) times daily.   tamsulosin  (FLOMAX ) 0.4 MG CAPS capsule TAKE 1 CAPSULE BY MOUTH EVERY DAY   Venlafaxine  HCl 150 MG TB24 Take 150  mg by mouth in the morning.   cyanocobalamin  1000 MCG tablet Take 1 tablet (1,000 mcg total) by mouth daily. (Patient not taking: Reported on 04/13/2024)   ferrous sulfate  325 (65 FE) MG tablet Take 1 tablet (325 mg total) by mouth 2 (two) times daily with a meal. (Patient not taking: Reported on 04/13/2024)   HYDROcodone -acetaminophen  (NORCO/VICODIN) 5-325 MG tablet Take 1 tablet by mouth every 6 (six) hours as needed for moderate pain (pain score 4-6) or severe pain (pain score 7-10). (Patient not taking: Reported on 04/13/2024)   magic mouthwash SOLN Take 15 mLs by mouth 4 (four) times daily as needed for mouth pain (sore throat). Suspension contains equal amounts of Maalox Extra Strength, nystatin , and diphenhydramine . (Patient not taking: Reported on 04/13/2024)   primidone  (MYSOLINE ) 50 MG tablet Take 1 tablet (50 mg total) by mouth 2 (two) times daily. (Patient not taking: Reported on 04/13/2024)   QUEtiapine (SEROQUEL) 25 MG tablet Take 1 tablet (25 mg total) by mouth daily at 6 (six) AM. (Patient not taking: Reported on 04/13/2024)   QUEtiapine (SEROQUEL) 50 MG tablet Take 1 tablet (50 mg total) by mouth at bedtime. (Patient not taking: Reported on 04/13/2024)   venlafaxine  (EFFEXOR ) 75 MG tablet Take 75 mg by mouth in the morning. (Patient not taking: Reported on 04/13/2024)   No current facility-administered medications on file prior to visit.  There are no discontinued medications.   Physical Exam:    04/13/2024    1:10 PM 04/13/2024   12:49 PM 03/20/2024    8:15 AM   Vitals with BMI  Height  6' 0   Weight  194 lbs 13 oz   BMI  26.41   Systolic 160 160 877  Diastolic 80 86 74  Pulse  114 899  Vital signs reviewed.  Nursing notes reviewed. Weight trend reviewed. Physical Activity: Sufficiently Active (10/10/2023)   Exercise Vital Sign    Days of Exercise per Week: 5 days    Minutes of Exercise per Session: 30 min   General Appearance:  No acute distress appreciable.   Well-groomed, healthy-appearing male.  Well proportioned with no abnormal fat distribution.  Good muscle tone. Pulmonary:  Normal work of breathing at rest, no respiratory distress apparent. SpO2: 96 %  Musculoskeletal: All extremities are intact.  Neurological:  Awake, alert, oriented, and engaged.  No obvious focal neurological deficits or cognitive impairments.  Sensorium seems unclouded.   Speech is clear and coherent with logical content. Psychiatric:  Appropriate mood, pleasant and cooperative demeanor, thoughtful and engaged during the exam  He couldn't remember my name -initially which is very out of character for him, but otherwise he seemed clear and alert but tired and distressed by foley  Verbalized to patient: Physical Exam NEUROLOGICAL: Cranial nerves show difficulty with ocular tracking, but facial symmetry is intact with symmetrical smile and eyebrow elevation.  Verbalized to patient: Results DIAGNOSTIC Hospitalization: Kidney injury, confusion, heart failure (03/06/2024)  PATHOLOGY Fistula repair: Resolved fistula, no fungal infection, no recurrent urinary tract infections (UTIs)     12/09/2023    2:29 PM 10/10/2023   11:58 AM 09/16/2023    2:10 PM 03/07/2023    1:35 PM  PHQ 2/9 Scores  PHQ - 2 Score 0 0 2 1  PHQ- 9 Score  0  7  4      Data saved with a previous flowsheet row definition   No image results found. CT PELVIS WO CONTRAST Result Date: 03/10/2024 CLINICAL DATA:  History  of colovesical fistula repair. Rule out anastomotic leak or recurrent fistula.  EXAM: CT CYSTOGRAM (CT PELVIS WITH CONTRAST) TECHNIQUE: Multidetector CT imaging through the pelvis was performed after dilute contrast had been introduced into the bladder for the purposes of performing CT cystography. RADIATION DOSE REDUCTION: This exam was performed according to the departmental dose-optimization program which includes automated exposure control, adjustment of the mA and/or kV according to patient size and/or use of iterative reconstruction technique. CONTRAST:  OMNIPAQUE  IOHEXOL  300 MG/ML  SOLN COMPARISON:  03/07/2024 FINDINGS: Initially, a cystogram was performed with pre and post bladder contrast imaging. Subsequently, after draining the bladder of contrast, rectal contrast was administered. Urinary Tract: The mild left-sided hydronephrosis has resolved. The mild-to-moderate right-sided hydroureteronephrosis is improved. Foley catheter within the bladder. Intra bladder contrast demonstrates multiple small saccules. Persistent bladder wall thickening. No contrast identified outside the bladder or within the adjacent bowel. Bowel: Surgical sutures within the distal sigmoid. Colonic contrast opacification is moderate within the rectum and distal sigmoid. More proximal contrast not present. The patient removed the enema tip prior to more proximal contrast opacification. No anastomotic leak identified. Scattered colonic diverticula. Diverting right lower quadrant loop ileostomy. Vascular/Lymphatic: Aortic Atherosclerosis. No pelvic sidewall adenopathy. Reproductive:  Mild prostatomegaly. Other:  No extraluminal gas or free pelvic fluid. Musculoskeletal: Presumed bone island in the right ischium. IMPRESSION: 1. Performance of a cystogram followed by a pelvic CT with rectal contrast administration demonstrates no evidence of contrast extravasation or colovesical fistula. 2. Prostatomegaly with bladder wall thickening and saccules, suggesting outlet obstruction. 3. Resolved left and improved  right-sided hydroureteronephrosis. Electronically Signed   By: Rockey Kilts M.D.   On: 03/10/2024 17:02   DG Abd Portable 1V Result Date: 03/09/2024 CLINICAL DATA:  Constipation. EXAM: DG ABD PORTABLE 1V COMPARISON:  None Available. FINDINGS: Right lower quadrant ostomy. Scattered gas-filled large and small bowel. No large or small bowel distention. No radiopaque stones. Degenerative changes in the spine and hips. Calcified phleboliths in the pelvis. Vascular calcifications. Lung bases are clear. IMPRESSION: Normal nonobstructive bowel gas pattern. Right lower quadrant ostomy. Electronically Signed   By: Elsie Gravely M.D.   On: 03/09/2024 15:58   CT ABDOMEN PELVIS WO CONTRAST Result Date: 03/07/2024 CLINICAL DATA:  Abdominal pain, acute, nonlocalized History of colovesical fistula repair, diverting ileostomy, now with AKI and hydronephrosis despite having Foley EXAM: CT ABDOMEN AND PELVIS WITHOUT CONTRAST TECHNIQUE: Multidetector CT imaging of the abdomen and pelvis was performed following the standard protocol without IV contrast. RADIATION DOSE REDUCTION: This exam was performed according to the departmental dose-optimization program which includes automated exposure control, adjustment of the mA and/or kV according to patient size and/or use of iterative reconstruction technique. COMPARISON:  Ultrasound renal 03/06/2024 FINDINGS: Lower chest: Cardiac changes suggestive of anemia. Hepatobiliary: No focal liver abnormality. No gallstones, gallbladder wall thickening, or pericholecystic fluid. No biliary dilatation. Pancreas: Diffusely atrophic. No focal lesion. Otherwise normal pancreatic contour. No surrounding inflammatory changes. No main pancreatic ductal dilatation. Spleen: Normal in size without focal abnormality. Adrenals/Urinary Tract: No adrenal nodule bilaterally. Bilateral moderate hydroureteronephrosis. No nephroureterolithiasis bilaterally. Decompressed urinary bladder with circumferential  urinary bladder wall thickening. Foley catheter in appropriate position. Gas noted within the urinary bladder lumen and left renal calyx (3:31). Stomach/Bowel: Stomach is within normal limits. No evidence of small bowel wall thickening or dilatation. Question slight wall thickening and irregularity of the rectum. Right lower quadrant diverting ileostomy noted. The appendix is not definitely identified with no inflammatory changes in the right  lower quadrant to suggest acute appendicitis. Vascular/Lymphatic: No abdominal aorta or iliac aneurysm. Mild atherosclerotic plaque of the aorta and its branches. No abdominal, pelvic, or inguinal lymphadenopathy. Reproductive: Prostate is enlarged measuring up to 4.8 cm. Other: No intraperitoneal free fluid. No intraperitoneal free gas. No organized fluid collection. Musculoskeletal: No abdominal wall hernia or abnormality. No suspicious lytic or blastic osseous lesions. No acute displaced fracture. Multilevel degenerative changes of the spine. IMPRESSION: 1. Circumferential urinary bladder wall thickening with associated bilateral moderate hydroureteronephrosis. Markedly limited evaluation on this noncontrast study. Underlying malignancy or infection are not excluded. 2. Gas within the collecting system may be due to Foley catheter placement. With differential diagnosis including emphysematous pyelitis. 3. Question slight wall thickening and irregularity of the rectum. Markedly limited evaluation on this noncontrast study. Underlying proctitis not excluded. 4. Prostatomegaly. 5. Right lower quadrant diverting ileostomy. 6. Colonic diverticulosis with no acute diverticulitis. 7.  Aortic Atherosclerosis (ICD10-I70.0). Electronically Signed   By: Morgane  Naveau M.D.   On: 03/07/2024 10:47   DG Chest Port 1 View Result Date: 03/07/2024 CLINICAL DATA:  Shortness of breath. EXAM: PORTABLE CHEST 1 VIEW COMPARISON:  03/06/2024 FINDINGS: Low volume film. Cardiopericardial  silhouette is at upper limits of normal for size. Interstitial markings are diffusely coarsened with chronic features. Small nodular density in the peripheral right mid lung is stable. Subtle nodular density in the left mid lung may reflect a confluence of shadows. No dense focal airspace consolidation. No substantial pleural effusion. Skin folds are seen over the upper lungs bilaterally. Telemetry leads overlie the chest. IMPRESSION: 1. Low volume film with chronic interstitial coarsening. 2. Subtle nodular density in the left mid lung may reflect a confluence of shadows. Pneumonia or underlying soft tissue lesion not excluded. Attention on follow-up recommended. 3. Calcified granuloma again noted right lung. Electronically Signed   By: Camellia Candle M.D.   On: 03/07/2024 07:40   US  RENAL Result Date: 03/06/2024 CLINICAL DATA:  [id number] AKI (acute kidney injury) [id number] EXAM: RENAL / URINARY TRACT ULTRASOUND COMPLETE COMPARISON:  02/05/2024, 09/28/2023 FINDINGS: Right Kidney: Renal measurements: 12.1 x 6.1 x 8.2 cm = volume: 313 mL.Normal echogenicity. No mass. Redemonstrated moderate to severe hydroureteronephrosis. No nephrolithiasis. Left Kidney: Renal measurements: 11.8 x 6.5 x 5.8 cm = volume: 234 mL. Normal echogenicity. No mass. Mild hydronephrosis. No nephrolithiasis. Bladder: Similar trabeculation of the urinary bladder wall. Other: None. IMPRESSION: 1. Similarly appearing bilateral hydronephrosis, moderate to severe on the right. 2. Trabeculation of the urinary bladder wall, which may be due to chronic bladder outlet obstruction. Electronically Signed   By: Rogelia Myers M.D.   On: 03/06/2024 17:14   DG Chest Port 1 View Result Date: 03/06/2024 CLINICAL DATA:  Shortness of breath, altered mental status and weakness. EXAM: PORTABLE CHEST 1 VIEW COMPARISON:  [id number] FINDINGS: The heart size and mediastinal contours are within normal limits. Very low bilateral lung volumes with bibasilar atelectasis. There  is no evidence of pulmonary edema, consolidation, pneumothorax or pleural fluid. Stable calcified granuloma in the right lung. The visualized skeletal structures are unremarkable. IMPRESSION: Very low bilateral lung volumes with bibasilar atelectasis. Electronically Signed   By: Marcey Moan M.D.   On: 03/06/2024 14:51   CT ABDOMEN PELVIS WO CONTRAST Result Date: 02/05/2024 CLINICAL DATA:  Acute abdominal pain EXAM: CT ABDOMEN AND PELVIS WITHOUT CONTRAST TECHNIQUE: Multidetector CT imaging of the abdomen and pelvis was performed following the standard protocol without IV contrast. RADIATION DOSE REDUCTION: This exam was performed according to the  departmental dose-optimization program which includes automated exposure control, adjustment of the mA and/or kV according to patient size and/or use of iterative reconstruction technique. COMPARISON:  CT abdomen and pelvis 12/02/2023. FINDINGS: Lower chest: No acute abnormality. Hepatobiliary: No focal liver abnormality is seen. No gallstones, gallbladder wall thickening, or biliary dilatation. Pancreas: Unremarkable. No pancreatic ductal dilatation or surrounding inflammatory changes. Spleen: Normal in size without focal abnormality. Adrenals/Urinary Tract: There is severe right and moderate left hydroureteronephrosis to the level of the bladder. No obstructing calculi are seen. There is bilateral perinephric fat stranding. Adrenal glands appear normal. The bladder is distended. There is bladder wall trabeculation and mild thickening. There some questionable mild hyperdensity layering within the dependent portion of the bladder. Foley catheter is malpositioned with balloon in the penile urethra. Stomach/Bowel: Stomach is within normal limits. Appendix appears normal. No evidence of bowel wall thickening, distention, or inflammatory changes. Right-sided ostomy present. Vascular/Lymphatic: Aortic atherosclerosis. No enlarged abdominal or pelvic lymph nodes.  Reproductive: Prostate gland is enlarged with some nodular protrusion into the bladder base. Other: There is a peristomal hernia containing nondilated bowel. There some skin thickening of the right anterior abdominal wall. There is no focal abdominal wall fluid collection. There is no ascites. Musculoskeletal: Degenerative changes affect the spine. IMPRESSION: 1. Foley catheter is malpositioned with balloon in the penile urethra. 2. Severe right and moderate left hydroureteronephrosis to the level of the bladder. No obstructing calculi are seen. Findings are concerning for bladder outlet obstruction. 3. Bladder wall trabeculation and mild thickening. Findings may be related to chronic outlet obstruction. Correlate clinically for cystitis. 4. Questionable mild hyperdensity layering within the dependent portion of the bladder. Findings may represent blood products or debris. 5. Right-sided ostomy with peristomal hernia containing nondilated bowel. 6. Skin thickening of the right anterior abdominal wall. Correlate clinically for cellulitis. 7. Aortic atherosclerosis. Aortic Atherosclerosis (ICD10-I70.0). Electronically Signed   By: Greig Pique M.D.   On: 02/05/2024 18:42   DG Chest Port 1 View Result Date: 02/05/2024 CLINICAL DATA:  Sepsis. EXAM: PORTABLE CHEST 1 VIEW COMPARISON:  January 09, 2024. FINDINGS: The heart size and mediastinal contours are within normal limits. Calcified granuloma seen in right lower lobe. No acute pulmonary disease. Severe degenerative changes seen involving left glenohumeral joint. IMPRESSION: No active disease. Electronically Signed   By: Lynwood Landy Raddle M.D.   On: 02/05/2024 13:53         ASSESSMENT & PLAN   Assessment & Plan Other insomnia Memory impairment Insomnia and cognitive impairment due to sleep deprivation and pain   Insomnia and cognitive impairment are secondary to sleep deprivation and pain from the Foley catheter. Sleep deprivation contributes to cognitive  issues, including memory problems and confusion. Pain level is reported as 8/10, primarily at the Foley site. Prescribe narcotics for pain management to improve sleep and a sleeping pill to aid in sleep. Encourage rest and avoidance of driving due to impaired cognitive status. Bipolar affective disorder, currently depressed, mild (HCC) Depression Depression is managed with venlafaxine  and Abilify . Current mental health status is complicated by physical discomfort and sleep deprivation. Continue venlafaxine  and Abilify  as prescribed. Monitor mental health status post-Foley removal and improved sleep. Chronic kidney disease, stage 3a (HCC) Recent hospitalizations for kidney injury and heart failure. Ordered blood work to assess kidney function. Protein-calorie malnutrition, severe Protein-calorie malnutrition is likely secondary to decreased appetite and weight loss, possibly related to sleep deprivation and discomfort from the Foley catheter. Encourage nutritional intake and monitor weight. 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 The catheter maintains urethral patency, and removal could necessitate immediate reinsertion due to stricture recurrence. Risks of removal include bladder swelling and potential rupture if not promptly managed. Urology advises keeping the Foley in place until a voiding trial is conducted. Continue the Foley catheter until the urology appointment on November 12th. Coordinate with urology for a voiding trial and potential straight catheterization. Discuss with urology the possibility of surgical intervention to address the stricture.   ORDER ASSOCIATIONS  #   DIAGNOSIS / CONDITION ICD-10 ENCOUNTER ORDER     ICD-10-CM   1. Other insomnia  G47.09 Suvorexant (BELSOMRA) 5 MG TABS    HYDROcodone -acetaminophen  (NORCO/VICODIN) 5-325  MG tablet    CBC with Differential/Platelet    Comp Met (CMET)    B12 and Folate Panel    2. Bipolar affective disorder, currently depressed, mild (HCC)  F31.31 CBC with Differential/Platelet    Comp Met (CMET)    B12 and Folate Panel    3. Chronic kidney disease, stage 3a (HCC)  N18.31 CBC with Differential/Platelet    Comp Met (CMET)    B12 and Folate Panel    4. Protein-calorie malnutrition, severe  E43 CBC with Differential/Platelet    Comp Met (CMET)    B12 and Folate Panel    5. Memory impairment  R41.3 TSH + free T4    6. 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    Dispense:  30 tablet    Refill:  2   HYDROcodone -acetaminophen  (NORCO/VICODIN) 5-325 MG tablet    Sig: Take 1 tablet by mouth every 6 (six) hours as needed for moderate pain (pain score 4-6) or severe pain (pain score 7-10).    Dispense:  20 tablet    Refill:  0     This document was synthesized by artificial intelligence (Abridge) using HIPAA-compliant recording of the clinical interaction;   We discussed the use of AI scribe software for clinical note transcription with the patient, who gave verbal consent to proceed. additional Info: This encounter employed state-of-the-art, real-time, collaborative documentation. The patient actively reviewed and assisted in updating their electronic medical record on a shared screen, ensuring transparency and facilitating joint problem-solving for the problem list, overview, and plan. This approach promotes accurate, informed care. The treatment plan  was discussed and reviewed in detail, including medication safety, potential side effects, and all patient questions. We confirmed understanding and comfort with the plan. Follow-up instructions were established, including contacting the office for any concerns, returning if symptoms worsen, persist, or new symptoms develop, and precautions for potential emergency department visits.

## 2024-04-13 NOTE — Assessment & Plan Note (Signed)
 Recent hospitalizations for kidney injury and heart failure. Ordered blood work to assess kidney function.

## 2024-04-13 NOTE — Assessment & Plan Note (Signed)
 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 The catheter maintains urethral patency, and removal could necessitate immediate reinsertion due to stricture recurrence. Risks of removal include bladder swelling and potential rupture if not promptly managed. Urology advises keeping the Foley in place until a voiding trial is conducted. Continue the Foley catheter until the urology appointment on November 12th. Coordinate with urology for a voiding trial and potential straight catheterization. Discuss with urology the possibility of surgical intervention to address the stricture.

## 2024-04-13 NOTE — Patient Instructions (Addendum)
 Team Member Role and Specialty Contact Info Address Start End Comments  Carolee Sherwood [name], MD Consulting Physician (Urology) Phone: [phone] Fax: [phone] [address] [place] [address] 09/28/2023 - -  It was a pleasure seeing you today! Your health and satisfaction are our top priorities.  Bernardino Cone, MD  VISIT SUMMARY: Today, we discussed your ongoing issues with discomfort from the Foley catheter, sleep deprivation, cognitive difficulties, and overall health. We reviewed your current medications and addressed concerns about dosages. We also talked about your recent weight loss and decreased appetite.  YOUR PLAN: -URETHRAL STRICTURE WITH INDWELLING FOLEY CATHETER: A urethral stricture is a narrowing of the urethra, often due to scarring from infections. You need to keep the Foley catheter in place until your urology appointment on November 12th. This catheter helps keep your urethra open. We will coordinate with urology for a voiding trial and discuss potential surgical options.  -INSOMNIA AND COGNITIVE IMPAIRMENT DUE TO SLEEP DEPRIVATION AND PAIN: Your difficulty sleeping and cognitive issues are likely due to the pain and discomfort from the Foley catheter. We will prescribe pain medication and a sleeping pill to help you rest better. Please avoid driving until your cognitive status improves.  -DEPRESSION: Depression is being managed with venlafaxine  and Abilify . Your mental health is currently affected by physical discomfort and lack of sleep. Continue taking your medications as prescribed, and we will monitor your mental health after the Foley catheter is removed and your sleep improves.  -CHRONIC KIDNEY DISEASE, STAGE 3A: Chronic kidney disease means your kidneys are not working as well as they should. We have ordered blood work to check your kidney function.  -PROTEIN-CALORIE MALNUTRITION: Protein-calorie malnutrition means you are not getting enough nutrients, likely  due to your decreased appetite and weight loss. Please try to eat more nutritious foods, and we will monitor your weight.  INSTRUCTIONS: Please keep the Foley catheter in place until your urology appointment on November 12th. We will coordinate with urology for a voiding trial and discuss potential surgical options. Avoid driving until your cognitive status improves. Continue taking venlafaxine  and Abilify  as prescribed. We have ordered blood work to check your kidney function. Try to eat more nutritious foods, and we will monitor your weight.  Your Providers PCP: Cone [name], MD,  [phone]) Referring Provider: Cone [name], MD,  [phone]) Care Team Provider: [name], MD,  [phone]) Care Team Provider: Shona Rush, MD,  [phone]) Care Team Provider: [facility],  [phone]) Care Team Provider: [facility], [place],  [phone]) Care Team Provider: Rudy [name] LABOR, MD,  [phone]) Care Team Provider: Legrand Victory [name], MD,  [phone]) Care Team Provider: Sheldon Standing, MD,  [phone]) Care Team Provider: Carolee Sherwood [name], MD,  [phone]) Care Team Provider: Daneen Damien BROCKS, NP,  [phone])  NEXT STEPS: [x]  Early Intervention: Schedule sooner appointment, call our on-call services, or go to emergency room if there is any significant Increase in pain or discomfort New or worsening symptoms Sudden or severe changes in your health [x]  Flexible Follow-Up: We recommend a Return in about 1 week (around 04/20/2024). for optimal routine care. This allows for progress monitoring and treatment adjustments. [x]  Preventive Care: Schedule your annual preventive care visit! It's typically covered by insurance and helps identify potential health issues early. [x]  Lab & X-ray Appointments: Incomplete tests scheduled today, or call to schedule. X-rays: [facility] (M-F, 8:30am-noon or 1pm-5pm). [x]   Medical Information Release: Sign a  release form at front desk to obtain relevant medical information we don't have.  MAKING THE MOST OF OUR FOCUSED 20 MINUTE APPOINTMENTS: [x]   Clearly state your top concerns at the beginning of the visit to focus our discussion [x]   If you anticipate you will need more time, please inform the front desk during scheduling - we can book multiple appointments in the same week. [x]   If you have transportation problems- use our convenient video appointments or ask about transportation support. [x]   We can get down to business faster if you use MyChart to update information before the visit and submit non-urgent questions before your visit. Thank you for taking the time to provide details through MyChart.  Let our nurse know and she can import this information into your encounter documents.  Arrival and Wait Times: [x]   Arriving on time ensures that everyone receives prompt attention. [x]   Early morning (8a) and afternoon (1p) appointments tend to have shortest wait times. [x]   Unfortunately, we cannot delay appointments for late arrivals or hold slots during phone calls.  Getting Answers and Following Up [x]   Simple Questions & Concerns: For quick questions or basic follow-up after your visit, reach us  at [phone] or MyChart messaging. [x]   Complex Concerns: If your concern is more complex, scheduling an appointment might be best. Discuss this with the staff to find the most suitable option. [x]   Lab & Imaging Results: We'll contact you directly if results are abnormal or you don't use MyChart. Most normal results will be on MyChart within 2-3 business days, with a review message from Dr. Jesus. Haven't heard back in 2 weeks? Need results sooner? Contact us  at [phone]. [x]   Referrals: Our referral coordinator will manage specialist referrals. The specialist's office should contact you within 2 weeks to schedule an appointment. Call us  if you haven't  heard from them after 2 weeks.  Staying Connected [x]   MyChart: Activate your MyChart for the fastest way to access results and message us . See the last page of this paperwork for instructions on how to activate.  Bring to Your Next Appointment [x]   Medications: Please bring all your medication bottles to your next appointment to ensure we have an accurate record of your prescriptions. [x]   Health Diaries: If you're monitoring any health conditions at home, keeping a diary of your readings can be very helpful for discussions at your next appointment.  Billing [x]   X-ray & Lab Orders: These are billed by separate companies. Contact the invoicing company directly for questions or concerns. [x]   Visit Charges: Discuss any billing inquiries with our administrative services team.  Your Satisfaction Matters [x]   Share Your Experience: We strive for your satisfaction! If you have any complaints, or preferably compliments, please let Dr. Jesus know directly or contact our Practice Administrators, Manuelita Rubin or [employer], by asking at the front desk.   Reviewing Your Records [x]   Review this early draft of your clinical encounter notes below and the final encounter summary tomorrow on MyChart after its been completed.  All orders placed so far are visible here: Other insomnia -     Belsomra; Take 1 tablet (5 mg total) by mouth at bedtime as needed.  Dispense: 30 tablet; Refill: 2 -     HYDROcodone -Acetaminophen ; Take 1 tablet by mouth every 6 (six) hours as needed for moderate pain (pain score 4-6) or severe pain (pain score 7-10).  Dispense: 20 tablet; Refill: 0 -     CBC with  Differential/Platelet -     Comprehensive metabolic panel with GFR -     A87 and Folate Panel  Bipolar affective disorder, currently depressed, mild (HCC) -     CBC with Differential/Platelet -     Comprehensive metabolic panel with GFR -     A87 and Folate Panel  Chronic kidney disease, stage 3a (HCC) -      CBC with Differential/Platelet -     Comprehensive metabolic panel with GFR -     A87 and Folate Panel  Protein-calorie malnutrition, severe -     CBC with Differential/Platelet -     Comprehensive metabolic panel with GFR -     A87 and Folate Panel  Memory impairment -     TSH + free T4  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

## 2024-04-13 NOTE — Assessment & Plan Note (Signed)
 Depression Depression is managed with venlafaxine  and Abilify . Current mental health status is complicated by physical discomfort and sleep deprivation. Continue venlafaxine  and Abilify  as prescribed. Monitor mental health status post-Foley removal and improved sleep.

## 2024-04-15 DIAGNOSIS — R339 Retention of urine, unspecified: Secondary | ICD-10-CM | POA: Diagnosis not present

## 2024-04-16 ENCOUNTER — Other Ambulatory Visit: Payer: Self-pay | Admitting: Internal Medicine

## 2024-04-16 ENCOUNTER — Telehealth: Payer: Self-pay | Admitting: Internal Medicine

## 2024-04-16 DIAGNOSIS — N3943 Post-void dribbling: Secondary | ICD-10-CM

## 2024-04-16 DIAGNOSIS — N138 Other obstructive and reflux uropathy: Secondary | ICD-10-CM

## 2024-04-16 NOTE — Telephone Encounter (Signed)
 [facility] faxed Home Health Certificate (Order ID [id number]), to be filled out by provider. Patient requested to send it back via Fax within ASAP. Document is located in providers tray at front office.Please advise at [phone].

## 2024-04-16 NOTE — Telephone Encounter (Signed)
 Received placed on provider desk to be signed.

## 2024-04-17 ENCOUNTER — Other Ambulatory Visit: Payer: Self-pay | Admitting: Internal Medicine

## 2024-04-17 ENCOUNTER — Telehealth: Payer: Self-pay

## 2024-04-17 DIAGNOSIS — N1831 Chronic kidney disease, stage 3a: Secondary | ICD-10-CM | POA: Diagnosis not present

## 2024-04-17 DIAGNOSIS — B961 Klebsiella pneumoniae [K. pneumoniae] as the cause of diseases classified elsewhere: Secondary | ICD-10-CM | POA: Diagnosis not present

## 2024-04-17 DIAGNOSIS — I5022 Chronic systolic (congestive) heart failure: Secondary | ICD-10-CM | POA: Diagnosis not present

## 2024-04-17 DIAGNOSIS — Z1612 Extended spectrum beta lactamase (ESBL) resistance: Secondary | ICD-10-CM | POA: Diagnosis not present

## 2024-04-17 DIAGNOSIS — I130 Hypertensive heart and chronic kidney disease with heart failure and stage 1 through stage 4 chronic kidney disease, or unspecified chronic kidney disease: Secondary | ICD-10-CM | POA: Diagnosis not present

## 2024-04-17 DIAGNOSIS — D638 Anemia in other chronic diseases classified elsewhere: Secondary | ICD-10-CM

## 2024-04-17 DIAGNOSIS — N390 Urinary tract infection, site not specified: Secondary | ICD-10-CM | POA: Diagnosis not present

## 2024-04-17 DIAGNOSIS — T83511A Infection and inflammatory reaction due to indwelling urethral catheter, initial encounter: Secondary | ICD-10-CM | POA: Diagnosis not present

## 2024-04-17 DIAGNOSIS — R339 Retention of urine, unspecified: Secondary | ICD-10-CM | POA: Diagnosis not present

## 2024-04-17 DIAGNOSIS — N179 Acute kidney failure, unspecified: Secondary | ICD-10-CM | POA: Diagnosis not present

## 2024-04-17 DIAGNOSIS — D631 Anemia in chronic kidney disease: Secondary | ICD-10-CM | POA: Diagnosis not present

## 2024-04-17 DIAGNOSIS — N401 Enlarged prostate with lower urinary tract symptoms: Secondary | ICD-10-CM | POA: Diagnosis not present

## 2024-04-17 NOTE — Telephone Encounter (Signed)
 Faxed over to [facility] home health certification at [phone]

## 2024-04-22 ENCOUNTER — Ambulatory Visit (INDEPENDENT_AMBULATORY_CARE_PROVIDER_SITE_OTHER): Admitting: Internal Medicine

## 2024-04-22 ENCOUNTER — Encounter: Payer: Self-pay | Admitting: Internal Medicine

## 2024-04-22 ENCOUNTER — Other Ambulatory Visit: Payer: Self-pay | Admitting: Urology

## 2024-04-22 VITALS — BP 100/66 | HR 85 | Temp 98.0°F | Ht 72.0 in | Wt 192.6 lb

## 2024-04-22 DIAGNOSIS — Z978 Presence of other specified devices: Secondary | ICD-10-CM

## 2024-04-22 DIAGNOSIS — Z01818 Encounter for other preprocedural examination: Secondary | ICD-10-CM | POA: Diagnosis not present

## 2024-04-22 DIAGNOSIS — N35119 Postinfective urethral stricture, not elsewhere classified, male, unspecified: Secondary | ICD-10-CM

## 2024-04-22 DIAGNOSIS — E43 Unspecified severe protein-calorie malnutrition: Secondary | ICD-10-CM

## 2024-04-22 NOTE — Patient Instructions (Signed)
 Team Member Role and Specialty Contact Info Address Start End Comments  Calvin Sherwood [name], Calvin Escobar Consulting Physician (Urology) Phone: [phone] Fax: [phone] [address] [place] [address] 09/28/2023 - -   It was a pleasure seeing you today! Your health and satisfaction are our top priorities.  Bernardino Cone, Calvin Escobar  VISIT SUMMARY: During your visit, we discussed your chronic pain and difficulty urinating due to a urethral stricture and the use of a Foley catheter. We reviewed your history of recurrent urinary tract infections and your current nutritional status. We have developed a plan to address these issues, including surgical intervention and pain management.  YOUR PLAN: -POSTINFECTIVE URETHRAL STRICTURE WITH CHRONIC INDWELLING FOLEY CATHETER: A urethral stricture is a narrowing of the urethra, often caused by infection or injury, leading to difficulty urinating. Surgery is necessary to remove or destroy the excess tissue causing the obstruction. You are urgently cleared for surgery, and you should contact Dr. Carolee to schedule it as soon as possible. Strong pain medications have been provided to help manage your symptoms until the surgery.  -CHRONIC PAIN DUE TO URETHRAL STRICTURE AND FOLEY CATHETER: Chronic pain from the urethral stricture and Foley catheter is significantly affecting your quality of life. Strong pain medications have been prescribed to help manage this pain.  -RECURRENT URINARY TRACT INFECTIONS: Recurrent urinary tract infections (UTIs) are common with a Foley catheter. Surgery is expected to reduce the risk of future UTIs. After surgery, monitor for signs of infection and take antibiotics if necessary.  -PROTEIN-CALORIE MALNUTRITION, SEVERE: Severe protein-calorie malnutrition means your body is not getting enough nutrients, which can be worsened by chronic illness and stress. Continue taking Ensure Plus high protein supplements and try to eat more protein-rich  foods to support your health and recovery.  INSTRUCTIONS: Please contact Dr. Carolee urgently to schedule your surgery. Continue taking the prescribed pain medications as directed. Monitor for any signs of urinary tract infection after surgery and take antibiotics if needed. Maintain your nutritional intake with Ensure Plus and protein-rich foods. Follow up with your healthcare provider as recommended.  Your Providers PCP: Cone [name], Calvin Escobar,  [phone]) Referring Provider: Cone [name], Calvin Escobar,  [phone]) Care Team Provider: [name], Calvin Escobar,  [phone]) Care Team Provider: Shona Rush, Calvin Escobar,  [phone]) Care Team Provider: [facility],  [phone]) Care Team Provider: [facility], [name],  [phone]) Care Team Provider: Rudy [name] LABOR, Calvin Escobar,  [phone]) Care Team Provider: Legrand Victory [name], Calvin Escobar,  [phone]) Care Team Provider: Sheldon Standing, Calvin Escobar,  [phone]) Care Team Provider: Carolee Sherwood [name], Calvin Escobar,  [phone]) Care Team Provider: Daneen Damien BROCKS, NP,  [phone])  NEXT STEPS: [x]  Early Intervention: Schedule sooner appointment, call our on-call services, or go to emergency room if there is any significant Increase in pain or discomfort New or worsening symptoms Sudden or severe changes in your health [x]  Flexible Follow-Up: We recommend a No follow-ups on file. for optimal routine care. This allows for progress monitoring and treatment adjustments. [x]  Preventive Care: Schedule your annual preventive care visit! It's typically covered by insurance and helps identify potential health issues early. [x]  Lab & X-ray Appointments: Incomplete tests scheduled today, or call to schedule. X-rays: [facility] (M-F, 8:30am-noon or 1pm-5pm). [x]  Medical Information Release: Sign a release form at front desk to obtain relevant medical information we don't have.  MAKING THE MOST OF OUR FOCUSED 20 MINUTE  APPOINTMENTS: [x]   Clearly state your top concerns at  the beginning of the visit to focus our discussion [x]   If you anticipate you will need more time, please inform the front desk during scheduling - we can book multiple appointments in the same week. [x]   If you have transportation problems- use our convenient video appointments or ask about transportation support. [x]   We can get down to business faster if you use MyChart to update information before the visit and submit non-urgent questions before your visit. Thank you for taking the time to provide details through MyChart.  Let our nurse know and she can import this information into your encounter documents.  Arrival and Wait Times: [x]   Arriving on time ensures that everyone receives prompt attention. [x]   Early morning (8a) and afternoon (1p) appointments tend to have shortest wait times. [x]   Unfortunately, we cannot delay appointments for late arrivals or hold slots during phone calls.  Getting Answers and Following Up [x]   Simple Questions & Concerns: For quick questions or basic follow-up after your visit, reach us  at [phone] or MyChart messaging. [x]   Complex Concerns: If your concern is more complex, scheduling an appointment might be best. Discuss this with the staff to find the most suitable option. [x]   Lab & Imaging Results: We'll contact you directly if results are abnormal or you don't use MyChart. Most normal results will be on MyChart within 2-3 business days, with a review message from Dr. Jesus. Haven't heard back in 2 weeks? Need results sooner? Contact us  at [phone]. [x]   Referrals: Our referral coordinator will manage specialist referrals. The specialist's office should contact you within 2 weeks to schedule an appointment. Call us  if you haven't heard from them after 2 weeks.  Staying Connected [x]   MyChart: Activate your MyChart for the fastest way to access results and message us . See the last page of  this paperwork for instructions on how to activate.  Bring to Your Next Appointment [x]   Medications: Please bring all your medication bottles to your next appointment to ensure we have an accurate record of your prescriptions. [x]   Health Diaries: If you're monitoring any health conditions at home, keeping a diary of your readings can be very helpful for discussions at your next appointment.  Billing [x]   X-ray & Lab Orders: These are billed by separate companies. Contact the invoicing company directly for questions or concerns. [x]   Visit Charges: Discuss any billing inquiries with our administrative services team.  Your Satisfaction Matters [x]   Share Your Experience: We strive for your satisfaction! If you have any complaints, or preferably compliments, please let Dr. Jesus know directly or contact our Practice Administrators, Manuelita Rubin or [employer], by asking at the front desk.   Reviewing Your Records [x]   Review this early draft of your clinical encounter notes below and the final encounter summary tomorrow on MyChart after its been completed.  All orders placed so far are visible here: There are no diagnoses linked to this encounter.

## 2024-04-22 NOTE — Progress Notes (Signed)
 " Patient Care Team: [name], MD as PCP - General (Internal Medicine) [name], MD as PCP - Cardiology (Cardiology) Shona Rush, MD (Dermatology) [facility] (Behavioral Health) [facility] (Psychiatry) [name], [name] LABOR, MD (Psychiatry) Legrand Victory LITTIE MOULD, MD as Consulting Physician (Gastroenterology) Sheldon Standing, MD as Consulting Physician (Colon and Rectal Surgery) [name], MD as Consulting Physician (Urology) Daneen Damien BROCKS, NP as Nurse Practitioner (Cardiology) Patient:  Calvin Escobar Today's Healthcare Provider: [name] Jesus, MD   Chief Complaint:  Requests consultative surgical examination, consent, and appropriate evaluation at the request of Dr. Carolee.  Assessment/Plan:   I did preoperative clearance so that hopefully our mutual patient can get on surgical schedule for urethral stricture repair; his foley is extremely distressing for him and his mental health is suffering, but physical health seems more than adequate to proceed to surgery   Today's consultation included a comprehensive preoperative evaluation to assess this patients overall health and determine the appropriateness of proceeding with the planned surgery, prostate resection- urethral stricture repair, given their current medical status. I emphasized that while we take all necessary precautions to minimize risks, no surgery is entirely without risk, and this evaluation helps us  identify and mitigate potential risks to the greatest extent possible. Together, we explored all available options to ensure the approach aligned with the patient's values and preferences. I used open communication techniques to explain the potential risks and benefits, reviewing the risk calculations detailed in the HPI with the patient. All specific procedural risks listed in the ACS NSQIP risk calculator, along with patient-specific risks, were explicitly explained and  considered. The patient was also made aware that unforeseen and unpredictable risks can occur.  Based on these assessments, the planned procedure, urethral stricture takedown/prostatic instrumentation, is classified as low intermediate risk. Considering the patient's overall clinical picture, including mild frailty, deconditioning, distress from foley,  and the results of the risk stratification tools (RCRI, NSQIP, and DASI), I have stratified their patient-specific cardiac risk as low.  Therefore, the following recommendations are made:  Surgery can proceed with close monitoring. No additional preoperative cardiac testing is recommended at this time based on the patient's low risk profile and current guidelines, specifically the 2022 ACC/AHA Guideline on Perioperative Cardiovascular Evaluation for Noncardiac Surgery and relevant UpToDate summaries The patient was instructed to hold medications on the morning of surgery and to arrive  prior to the scheduled procedure.  I engaged in a detailed discussion with the patient regarding these risks, benefits, and recommendations, thoroughly explaining the implications of each risk assessment tool (RCRI, NSQIP, and DASI) and how they contribute to the overall risk stratification. I used clarifying questions and summaries (teach-back method) to ensure their complete understanding of the information presented, including their individual risk profile and the rationale for recommended testing and monitoring. The patient demonstrated a clear understanding of these elements and possesses the capacity to make informed decisions regarding their care. We also discussed alternative treatment options, including [list alternatives], and the patients rationale for proceeding with surgery was [document their reasoning].  Despite any recommendations I may provide, the patient was empowered with the ultimate right to choose the course of treatment that best suits him.   He is  fully aware and desires to proceed to surgery as soon as logistically possible. This documented discussion will be readily available to the patient in their MyChart.    Cardiac Risk Assessment Protocol:   This preoperative cardiac  risk assessment is performed in accordance with the 2022 ACC/AHA Guideline on Perioperative Cardiovascular Evaluation and the expert guidelines from UpToDate (www.uptodate.com).  Key features of these recommendations:  We usually obtain an electrocardiogram (ECG) for patients with known cardiovascular disease, significant arrhythmia, or significant structural heart disease if they are to undergo an intermediate- to high-risk surgery. If the patient reports no recent or interim cardiac symptoms, we do not repeat an ECG if one has been done in the past 12 months. We do not routinely perform a preoperative ECG in patients who undergo low-risk surgery (<1 percent estimated risk of cardiac death or nonfatal MI)  Other than a serum creatinine, no routine screening blood tests are indicated specifically for evaluation of cardiac risk.   Elevated NT proBNP is associated with increased 30-day and 1 year death.  We do not routinely use BNP or NT-proBNP for preoperative risk stratification as their use has not yet been associated with improved clinical outcomes. However, BNP/NT-proBNP may be helpful in patients for whom the decision to undergo preoperative cardiac testing is unclear.  We use an NT-proBNP cutoff  of 200 pg/mL or a BNP of 92 ng/L to help decide whether or not to pursue stress testing or coronary computed tomography angiography (CCTA) in patients with unclear cardiac risk in whom testing would influence management  The ACC/AHA guidelines do not recommend obtaining a preoperative troponin level, and most clinicians do not routinely order them. We recognize that the 2022 ESC guidelines recommend obtaining preoperative troponins in patients >37 years old or with cardiac risk  factors undergoing intermediate to high-risk surgery and take this into consideration when deciding about recommending them.   1. RCRI Risk Categories RCRI = 0 (Low Risk: <1% MACE) Patients with an RCRI score of 0 do not require additional preoperative cardiac testing or cardiology consultation beyond routine evaluation, and may proceed with standard perioperative monitoring PMC. 2. Procedural Risk Classification Low-Risk Surgery (<1% MACE) Low-risk procedures do not require routine preoperative ECG, biomarkers, or additional cardiac imaging, and may proceed with standard anesthetic and monitoring protocols PMC. 3. Functional Capacity (Duke Activity Status Index) DASI >=34 (>=4?METs) A DASI score >=34 indicates adequate functional capacity; no further functional testing or preoperative stress testing is required, and routine perioperative evaluation is sufficient [employer] of Cardiology. 4. ASA Physical Status ASA II (Mild systemic disease) Optimize mild comorbidities (e.g., hypertension, well-controlled diabetes) preoperatively; no specialized cardiac or anesthesia evaluation necessary beyond standard care NCBI.  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 Discussed the risk of bladder rupture and severe complications if the catheter is removed without surgery. Surgery will likely involve a transurethral approach to remove or destroy excess tissue. The risk of death from surgery is very low at 0.1%, with a 5% chance of urinary tract infection, though severe infections are rare. He is urgently cleared for surgery and advised to contact Dr. Carolee to expedite scheduling. Strong pain medications provided for symptom management.  Chronic pain due to urethral stricture and Foley catheter    Chronic pain from the urethral stricture and Foley catheter significantly impacts his quality of life. Strong pain medications prescribed for management.  Recurrent urinary tract infections   Recurrent UTIs with a history of fungal infection. Post-surgery UTI risk is 5%, with severe infections being rare. Surgery is expected to reduce recurrent UTI risk. Monitor for  UTI signs post-surgery and administer antibiotics if necessary.  Protein-calorie malnutrition, severe   Severe protein-calorie malnutrition likely worsened by chronic illness and stress. Nutritional support is essential for health and recovery. Continue Ensure Plus high protein supplements and encourage dietary intake of protein-rich foods.  Recording duration: 31 minutes     Subjective:  HPI   Discussed the use of AI scribe software for clinical note transcription with the patient, who gave verbal consent to proceed.  History of Present Illness 73 year old male with a urethral stricture who presents with chronic pain and difficulty urinating due to a Foley catheter.  He has been experiencing significant discomfort and chronic pain due to a urethral stricture, necessitating the use of a Foley catheter. This condition has persisted for several weeks, with the catheter filling up every three to four hours, requiring frequent trips to the bathroom. He describes the catheter as a 'nuisance more than anything.'  He recalls that after a previous Foley catheter insertion, he experienced pain and later developed problems with urination. He recalls an incident last year when the catheter was removed, leading to delirium and an inability to urinate, necessitating an emergency room visit where the catheter was reinserted.  He reports significant impact on his daily life, including difficulty performing light household tasks and a reduced ability to engage in physical activities. He mentions being able to walk but becomes very tired  quickly. His appetite has been affected, with fluctuating hunger levels, and he has been experiencing poor sleep, which is somewhat alleviated by medication provided by his caregiver.  No recent chest pain, heart pain, stroke symptoms, or significant breathing difficulties. He has a history of a fungal infection related to the urinary tract, which was previously treated successfully.  He is not diabetic, does not take blood pressure medication, and has no history of heart congestion or heart failure. He does not smoke. His current weight is 192 pounds, and he is 6'4 tall. He has difficulty gaining weight, partly due to dietary preferences and availability.    DASI calculated mets The higher the DASI score, the more physically active the patient is. Patients who can achieve <4 METs have poor functional capacity, 4 to 10 METs suggest moderate functional capacity, and >10 METs suggest excellent functional capacity. The DASI questionnaire is not designed to assess very high levels of physical activity. The maximum DASI score is 58.2, which would be the equivalent of 9.89 METs.  Its controversial but many clinicians use a cutoff DASI of less than 25-34 as a marker of increased surgical risk.     Curator of Surgeons Surgical Risk (ACS NSQIP) This calculator is best for calculating surgery specific risk - High risk - >5 percent - Intermediate risk - >=1 to <=5 percent - Low risk - <1 percent      MICA Zane) perioperative risk  RCRI Calculated Risk    ROS   unless noted otherwise, a complete review of systems was negative.    Past Medical History:  Diagnosis Date   Acute cystitis 05/13/2023   Acute hyponatremia 05/13/2023   Acute kidney injury superimposed on chronic kidney disease 05/13/2023   CLINICAL CONTEXT: 73 year old male with stage 3 CKD, colovesical fistula, recurrent UTIs, and recent candidal UTI. Recent renal ultrasound (09/28/2023) confirmed mild left hydronephrosis  and prostatic mass protruding into bladder base. Iron  studies consistent with anemia of chronic disease (iron  32, TIBC 238, ferritin 420).   DIAGNOSTIC ASSESSMENT: ? 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 History of GI bleeding (07/2023). Currently on ferrous sulfate  supplementation.   DIAGNOSTIC ASSESSMENT: ? Primary etiology (70-80% probability): Anemia of chronic disease/inflammation ? 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  EF 35 to 40% on echo 04/2021   Dementia (HCC)    Depression    Diarrhea 05/13/2023   E coli bacteremia 05/14/2023   High anion gap metabolic acidosis 07/13/2023   History of Clostridioides difficile colitis 08/15/2023   Associated with colovesical fistula? Recurrent antibiotic(s) requirement     Hyperkalemia    Associated with losartan   Advised patient low potassium diet 08/2022             Lab Results      Component    Value    Date/Time           K    4.0    12/27/2023 01:20 PM           K    4.4    12/18/2023 05:07 AM           K    4.4    12/17/2023 06:01 AM           K    4.7    12/16/2023 06:15 AM           K    4.9    12/15/2023  08:21 AM           K    4.4    12/14/2023 06:00 AM           K    5.0    Hyperlipidemia    Hypertension    Leukocytosis 12/11/2023   Lab Results      Component    Value    Date/Time           WBC    6.2    12/27/2023 01:20 PM           WBC    11.6 (H)    12/18/2023 05:07 AM           WBC    9.6    12/17/2023 06:01 AM           WBC    9.4    12/16/2023 06:15 AM           WBC    12.7 (H)    12/15/2023 08:21 AM           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  Work-up with cardiology in January 2021.     catheterization    Assessment:  1. Normal coronaries  2. NICM with EF 25-30%  3. Normal hemodynamics  .     Pneumonia    PUD (peptic ulcer disease) 01/12/2015   Formatting of this note might be different from the original.  Endoscopy esophageal stricture 2016     Septic shock from UTI 06/02/2023   Severe sepsis (HCC) 06/03/2023   SIRS (systemic inflammatory response syndrome) (HCC) 12/11/2023   SKIN CANCER, HX OF 05/10/2007   Facial left check and forehead and r shoulder  F/w derm   Syncope 05/13/2023   Thrombocytopenia 06/02/2023   Lab Results      Component    Value    Date           PLT    301    07/19/2023           PLT    324    07/18/2023           PLT    268    07/16/2023           PLT    207    07/14/2023           PLT    228    07/13/2023             Lab Results      Component    Value    Date           ESRSEDRATE    60 (H)    05/08/2023     No results found for: CRP          Lab Results      Component    Value       Urinary catheter complication 06/24/2023   Urinary dribbling 03/14/2022   Urinary incontinence 11/21/2022      Urinary Incontinence: They are wearing disposable diapers daily due to urinary incontinence, primarily nocturnal, and are currently on Tamsulosin  (Flomax ). We will refer them to Urology for further evaluation and potential treatment  options and continue Tamsulosin  until they are seen by Urology.     Urinary tract infection without hematuria 06/02/2023   UTI (urinary tract infection) 07/01/2023   Patient Active Problem List   Diagnosis Date Noted   Gastrointestinal hemorrhage with melena 03/17/2024   Postoperative anemia due to acute blood loss 03/17/2024   Protein-calorie malnutrition, severe 03/13/2024   Acute kidney injury superimposed on stage 3a chronic kidney disease (HCC) with hyperkalemia 03/06/2024   Acute encephalopathy 03/06/2024   Chronic hyponatremia 03/06/2024   Acute kidney injury 02/05/2024   Malnutrition of moderate degree 01/14/2024   OSA on CPAP 01/14/2024   Diverticulitis of sigmoid colon s/p robotic colectomy 01/09/2024 01/14/2024   Ileostomy in place [facility]) 01/11/2024   History of COVID-19 01/09/2024   History of urinary retention 01/09/2024   Chronic indwelling Foley catheter 01/09/2024   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   Chronic HFrEF (heart failure with reduced ejection fraction) (HCC) 12/13/2023   Hematuria 12/02/2023   Medication management 11/05/2023   Hydronephrosis, right 09/28/2023   Prostatic mass 09/28/2023   Weight loss, abnormal 08/14/2023   Hypocalcemia 08/14/2023   Hypoalbuminemia 08/14/2023   GERD (gastroesophageal reflux disease) 07/13/2023   Recurrent UTI 07/01/2023   LBBB (left bundle branch block) 06/02/2023   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   Acute kidney injury superimposed on chronic kidney disease 05/13/2023   Obesity (BMI 30-39.9) 05/13/2023   Tremor 05/08/2023   Lack of appetite 05/08/2023   Chronic kidney disease, stage  3a (HCC) 03/07/2023   Anemia of chronic disease 03/07/2023   Limp 12/19/2022   Gynecomastia, male 12/19/2022   Chronic back pain 12/19/2022   Hypertension 11/21/2022   Generalized arthritis 07/18/2022   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   DCM (dilated cardiomyopathy) (HCC)    OSA treated with BiPAP    Primary insomnia 08/05/2017    Dementia (HCC)    HLD (hyperlipidemia)    Hyperkalemia    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   BPH (benign prostatic hyperplasia) 03/27/2012   Morbid obesity (HCC) 03/27/2012   Bipolar affective disorder, depressed (HCC) 05/10/2007   Chronic bronchitis (HCC) 05/10/2007   Past Surgical History:  Procedure Laterality Date   COLECTOMY, SIGMOID, ROBOT-ASSISTED N/A 01/09/2024   Procedure: ROBOTIC LOW ANTERIOR RESECTION WITH ANASTOMOSIS, DRAINAGE OF PELVIC ABSCESS, COLOVESICAL FISTULA TAKEDOWN AND REPAIR, INTRAOPERATIVE ASSESSMENT OF TISSUE PERFUSION USING ICG DYE, AND BILATERAL TAP BLOCK;  Surgeon: Sheldon Standing, MD;  Location: [facility] ORS;  Service: General;  Laterality: N/A;  ROBOTIC RESECTION OF COLON RECTOSIGMOID   COLONOSCOPY     COLONOSCOPY N/A 12/05/2023   Procedure: COLONOSCOPY;  Surgeon: Leigh Standing SQUIBB, MD;  Location: [facility] ENDOSCOPY;  Service: Gastroenterology;  Laterality: N/A;   CYSTOSCOPY WITH INDOCYANINE GREEN  IMAGING (ICG) N/A 01/09/2024   Procedure: CYSTOSCOPY WITH INDOCYANINE GREEN  IMAGING (ICG);  Surgeon: Watt Rush, MD;  Location: [facility];  Service: Urology;  Laterality: N/A;  CYSTO WITH FIREFLY INJECTION   ESOPHAGOGASTRODUODENOSCOPY N/A 03/17/2024   Procedure: EGD (ESOPHAGOGASTRODUODENOSCOPY);  Surgeon: Nandigam, Kavitha V, MD;  Location: [facility] ENDOSCOPY;  Service: Gastroenterology;  Laterality: N/A;   FLEXIBLE SIGMOIDOSCOPY N/A 01/09/2024   Procedure: KINGSTON SIDE;  Surgeon: Sheldon Standing, MD;  Location: [facility];  Service: General;  Laterality: N/A;   ILEOSTOMY CLOSURE N/A 03/12/2024   Procedure: CLOSURE, ILEOSTOMY;  Surgeon: Sheldon Standing, MD;  Location: [facility] OR;  Service: General;  Laterality: N/A;  GEN WITH ERAS PATHWAY   LAPAROSCOPIC LOOP COLOSTOMY N/A 01/09/2024   Procedure: CREATION OF DIVERTING LOOP ILEOSTOMY;  Surgeon: Sheldon Standing, MD;  Location: [facility];  Service: General;  Laterality: N/A;   RECTAL EXAM UNDER ANESTHESIA N/A 03/12/2024   Procedure: EXAM UNDER  ANESTHESIA, RECTUM;  Surgeon: Sheldon Standing, MD;  Location: [facility] OR;  Service: General;  Laterality: N/A;   RIGHT/LEFT HEART CATH AND CORONARY ANGIOGRAPHY N/A 07/03/2019   Procedure: RIGHT/LEFT HEART CATH AND CORONARY ANGIOGRAPHY;  Surgeon: Cherrie Toribio SAUNDERS, MD;  Location: [facility] INVASIVE CV LAB;  Service: Cardiovascular;  Laterality: N/A;   ROBOTIC ASSISTED LAPAROSCOPIC LYSIS OF ADHESION  01/09/2024   Procedure: LYSIS, ADHESIONS, ROBOT-ASSISTED, LAPAROSCOPIC;  Surgeon: Sheldon Standing, MD;  Location: [facility];  Service: General;;    Family History  Problem Relation Age of Onset   Hypertension Mother    Cancer Father    Bone cancer Sister     Medications were reconciled and perioperative medicine management was discussed Current Outpatient Medications  Medication Sig Dispense Refill   busPIRone  (BUSPAR ) 10 MG tablet Take 1 tablet (10 mg total) by mouth 3 (three) times daily. 270 tablet 3   carvedilol  (COREG ) 3.125 MG tablet Take 1 tablet (3.125 mg total) by mouth 2 (two) times daily with a meal.     cyanocobalamin  1000 MCG tablet Take 1 tablet (1,000 mcg total) by mouth daily. (Patient not taking: Reported on 04/13/2024)     feeding supplement (ENSURE PLUS HIGH PROTEIN) LIQD Take 237 mLs by mouth 3 (three) times daily between meals.     ferrous sulfate  325 (65 FE) MG  tablet Take 1 tablet (325 mg total) by mouth 2 (two) times daily with a meal. (Patient not taking: Reported on 04/13/2024) 60 tablet 2   finasteride  (PROSCAR ) 5 MG tablet Take 5 mg by mouth daily.     folic acid  (FOLVITE ) 1 MG tablet TAKE 1 TABLET BY MOUTH EVERY DAY 90 tablet 4   gabapentin  (NEURONTIN ) 300 MG capsule Take 300 mg by mouth at bedtime.     HYDROcodone -acetaminophen  (NORCO/VICODIN) 5-325 MG tablet Take 1 tablet by mouth every 6 (six) hours as needed for moderate pain (pain score 4-6) or severe pain (pain score 7-10). (Patient not taking: Reported on 04/13/2024) 10 tablet 0   HYDROcodone -acetaminophen  (NORCO/VICODIN) 5-325 MG  tablet Take 1 tablet by mouth every 6 (six) hours as needed for moderate pain (pain score 4-6) or severe pain (pain score 7-10). 20 tablet 0   loperamide  (IMODIUM  A-D) 2 MG tablet Take 6 mg by mouth 4 (four) times daily.     magic mouthwash SOLN Take 15 mLs by mouth 4 (four) times daily as needed for mouth pain (sore throat). Suspension contains equal amounts of Maalox Extra Strength, nystatin , and diphenhydramine . (Patient not taking: Reported on 04/13/2024)     melatonin 5 MG TABS Take 1 tablet (5 mg total) by mouth at bedtime.     Multiple Vitamin (MULTIVITAMIN WITH MINERALS) TABS tablet Take 1 tablet by mouth daily.     omeprazole  (PRILOSEC) 40 MG capsule Take 40 mg by mouth See admin instructions. Take 40 mg by mouth before breakfast- may be opened and sprinkled on applesauce     ondansetron  (ZOFRAN ) 4 MG tablet Take 4 mg by mouth every 6 (six) hours as needed for nausea.     polycarbophil (FIBERCON) 625 MG tablet Take 1 tablet (625 mg total) by mouth 2 (two) times daily. 60 tablet 6   pravastatin  (PRAVACHOL ) 20 MG tablet TAKE 1 TABLET BY MOUTH EVERYDAY AT BEDTIME (Patient taking differently: Take 20 mg by mouth at bedtime.) 90 tablet 3   primidone  (MYSOLINE ) 50 MG tablet Take 1 tablet (50 mg total) by mouth 2 (two) times daily. (Patient not taking: Reported on 04/13/2024)     QUEtiapine  (SEROQUEL ) 25 MG tablet Take 1 tablet (25 mg total) by mouth daily at 6 (six) AM. (Patient not taking: Reported on 04/13/2024)     QUEtiapine  (SEROQUEL ) 50 MG tablet Take 1 tablet (50 mg total) by mouth at bedtime. (Patient not taking: Reported on 04/13/2024)     sodium bicarbonate  650 MG tablet Take 2 tablets (1,300 mg total) by mouth 2 (two) times daily.     Suvorexant  (BELSOMRA ) 5 MG TABS Take 1 tablet (5 mg total) by mouth at bedtime as needed. 30 tablet 2   tamsulosin  (FLOMAX ) 0.4 MG CAPS capsule TAKE 1 CAPSULE BY MOUTH EVERY DAY 90 capsule 1   venlafaxine  (EFFEXOR ) 75 MG tablet Take 75 mg by mouth in the  morning. (Patient not taking: Reported on 04/13/2024)     Venlafaxine  HCl 150 MG TB24 Take 150 mg by mouth in the morning.     No current facility-administered medications for this visit.   Allergies-reviewed and updated Allergies  Allergen Reactions   Albuterol  Palpitations and Other (See Comments)    Supraventricular Tachycardia    Social History   Socioeconomic History   Marital status: Married    Spouse name: Not on file   Number of children: Not on file   Years of education: Not on file   Highest education level:  Not on file  Occupational History   Not on file  Tobacco Use   Smoking status: Never   Smokeless tobacco: Never  Vaping Use   Vaping status: Never Used  Substance and Sexual Activity   Alcohol use: Not Currently   Drug use: No   Sexual activity: Not Currently  Other Topics Concern   Not on file  Social History Narrative   Not on file   Social Drivers of Health   Financial Resource Strain: Low Risk  (10/10/2023)   Overall Financial Resource Strain (CARDIA)    Difficulty of Paying Living Expenses: Not hard at all  Food Insecurity: Patient Unable To Answer (03/07/2024)   Hunger Vital Sign    Worried About Programme Researcher, Broadcasting/film/video in the Last Year: Patient unable to answer    Ran Out of Food in the Last Year: Patient unable to answer  Transportation Needs: Patient Unable To Answer (03/07/2024)   PRAPARE - Transportation    Lack of Transportation (Medical): Patient unable to answer    Lack of Transportation (Non-Medical): Patient unable to answer  Physical Activity: Sufficiently Active (10/10/2023)   Exercise Vital Sign    Days of Exercise per Week: 5 days    Minutes of Exercise per Session: 30 min  Stress: No Stress Concern Present (10/10/2023)   [employer] of Occupational Health - Occupational Stress Questionnaire    Feeling of Stress : Not at all  Social Connections: Moderately Integrated (03/07/2024)   Social Connection and Isolation Panel    Frequency  of Communication with Friends and Family: Twice a week    Frequency of Social Gatherings with Friends and Family: Twice a week    Attends Religious Services: Never    Database Administrator or Organizations: Yes    Attends Engineer, Structural: More than 4 times per year    Marital Status: Married  Recent Concern: Social Connections - Socially Isolated (12/11/2023)   Social Connection and Isolation Panel    Frequency of Communication with Friends and Family: Never    Frequency of Social Gatherings with Friends and Family: Never    Attends Religious Services: Never    Database Administrator or Organizations: No    Attends Engineer, Structural: Never    Marital Status: Married         Objective:  Physical Exam:  I. General Appearance:      General: Well-developed, well-nourished, in no acute distress.      Level of Consciousness: Alert and oriented to person, place, and time.     BP 100/66   Pulse 85   Temp 98 F (36.7 C) (Temporal)   Ht 6' (1.829 m)   Wt 192 lb 9.6 oz (87.4 kg)   SpO2 98%   BMI 26.12 kg/m        Wt Readings from Last 3 Encounters:  04/22/24 192 lb 9.6 oz (87.4 kg)  04/13/24 194 lb 12.8 oz (88.4 kg)  03/19/24 201 lb 1 oz (91.2 kg)        BP Readings from Last 3 Encounters:  04/22/24 100/66  04/13/24 (!) 160/80  03/20/24 122/74    Clinical Frailty Scale: Living With Very Mild Frailty: Previously named Vulnerable, While not dependent on others for daily help, symptoms often limit activities. A common complaint is being slowed-up and being tired during the day.  II. Head, Eyes, Ears, Nose, and Throat (HEENT):      Head: Normocephalic, atraumatic (NCAT).  Eyes:         Pupils: Equal, round, reactive to light and accommodation (PERRLA).         Sclerae: Non-icteric.         Conjunctivae: Pink, no injection.         Extraocular Movements (EOMs): Intact.     I (soft palate, uvula, fauces, and tonsillar pillars visible)     Any  conditions that might make intubation difficult (e.g., limited neck mobility, prominent overbite): none     Ears: External canals clear, tympanic membranes normal. (If examined)     Nose: Nasal mucosa moist, no discharge.     Throat: Oropharynx dry, no erythema or exudates.  III. Cardiovascular (CV):      Heart Rate and Rhythm: Regular rate and rhythm.  Pulse Readings from Last 1 Encounters:  04/22/24 85       Heart Sounds: No murmurs, rubs, or gallops. Specify S1 and S2 present.     Peripheral Pulses: Palpable and equal bilaterally (document location, e.g., radial, dorsalis pedis). If pulses are diminished or absent, document that as well.     Capillary Refill: <2 seconds.  Old EKG:    IV. Respiratory:      Lungs: Clear to auscultation bilaterally. No wheezes, rales (crackles), or rhonchi.   SpO2 Readings from Last 1 Encounters:  04/22/24 98%        Respiratory Effort:  No use of accessory muscles, no retractions .Normal chest wall movement .  V. Abdomen:      Abdomen: Soft, non-tender, non-distended.     Bowel Sounds: Present in all four quadrants.     No guarding, rigidity, or rebound tenderness. (If present, specify location and severity.)  VI. Musculoskeletal:      Extremities: No edema, cyanosis, or clubbing.     Range of Motion:  from      Skin: Warm, dry, intact. No rashes, lesions, or ulcers of concern near planned surgical site. Chronic foley    VII. Neurologic (Neuro):      Mental Status: Alert and oriented      Cranial Nerves: Grossly intact.     Motor:  No focal or generalized weakness apparent     Gait: (If assessed) Steady gait.  VIII. Psychiatric (Psych):      Mood and Affect: Appropriate.     Thought Process and Content: Normal. Demonstrates understanding.    No results found for this or any previous visit (from the past 24 hours).      Care coordination: A copy of this note will be forwarded to the requesting physician  I personally spent a  total of 34 minutes in the care of the patient today including preparing to see the patient, getting/reviewing separately obtained history, performing a medically appropriate exam/evaluation, counseling and educating, referring and communicating with other health care professionals, documenting clinical information in the EHR, communicating results, and coordinating care.  "

## 2024-05-07 ENCOUNTER — Telehealth: Payer: Self-pay

## 2024-05-07 NOTE — Progress Notes (Addendum)
 Date of COVID positive in last 90 days:  No  PCP - Bernardino Cone, MD Cardiologist - Wilbert Bihari, MD  Medical clearance in Epic dated 04-22-24  Chest x-ray - 1V 03-07-24 Epic EKG - 03-12-24 Epic Stress Test - N/A ECHO - 12-13-23 Epic Long Term Monitor - 2021 Epic Cardiac Cath - 07-03-19 Epic Pacemaker/ICD device last checked:N/A Spinal Cord Stimulator:N/A  Bowel Prep - N/A  Sleep Study - Yes, +sleep apnea CPAP - No  Fasting Blood Sugar - N/A Checks Blood Sugar _____ times a day  Last dose of GLP1 agonist-  N/A GLP1 instructions:  Do not take after     Last dose of SGLT-2 inhibitors-  N/A SGLT-2 instructions:  Do not take after    Blood Thinner Instructions: N/A Last dose:   Time: Aspirin  Instructions:N/A Last Dose:  Activity level:  Difficulty climbing stairs due to arthritis.  Able to perform activities of daily living with assistance, denies chest pain or shortness of breath.  Anesthesia review: DCM, ascending aorta dilation, LBBB, HTN, CHF, OSA, CKD, SIRS. Dementia  Hemoglobin 9 on preop labs  Patient denies shortness of breath, fever, cough and chest pain at PAT appointment  Patient verbalized understanding of instructions that were given to them at the PAT appointment. Patient was also instructed that they will need to review over the PAT instructions again at home before surgery.

## 2024-05-07 NOTE — Patient Instructions (Addendum)
 SURGICAL WAITING ROOM VISITATION Patients having surgery or a procedure may have no more than 2 support people in the waiting area - these visitors may rotate.    Children under the age of 41 must have an adult with them who is not the patient.  If the patient needs to stay at the hospital during part of their recovery, the visitor guidelines for inpatient rooms apply. Pre-op nurse will coordinate an appropriate time for 1 support person to accompany patient in pre-op.  This support person may not rotate.    Please refer to the [facility] website for the visitor guidelines for Inpatients (after your surgery is over and you are in a regular room).       Your procedure is scheduled on: 05-25-24   Report to [facility] Main Entrance    Report to admitting at 5:15 AM   Call this number if you have problems the morning of surgery [phone]   Do not eat food or drink liquids :After Midnight.           If you have questions, please contact your surgeon's office.   FOLLOW  ANY ADDITIONAL PRE OP INSTRUCTIONS YOU RECEIVED FROM YOUR SURGEON'S OFFICE!!!     Oral Hygiene is also important to reduce your risk of infection.                                    Remember - BRUSH YOUR TEETH THE MORNING OF SURGERY WITH YOUR REGULAR TOOTHPASTE   Do NOT smoke after Midnight   Take these medicines the morning of surgery with A SIP OF WATER :    Aripiprazole    Buspirone    Finasteride    Gabapentin    Primidone    Tamsulosin    Venalafaxine    Stop all vitamins and herbal supplements 7 days before surgery  Bring CPAP mask and tubing day of surgery.                              You may not have any metal on your body including jewelry, and body piercing             Do not wear lotions, powders, cologne, or deodorant              Men may shave face and neck.   Do not bring valuables to the hospital. [facility] IS NOT RESPONSIBLE   FOR VALUABLES.   Contacts, dentures or bridgework  may not be worn into surgery.   Bring small overnight bag day of surgery.   DO NOT BRING YOUR HOME MEDICATIONS TO THE HOSPITAL. PHARMACY WILL DISPENSE MEDICATIONS LISTED ON YOUR MEDICATION LIST TO YOU DURING YOUR ADMISSION IN THE HOSPITAL!     Special Instructions: Bring a copy of your healthcare power of attorney and living will documents the day of surgery if you haven't scanned them before.              Please read over the following fact sheets you were given: IF YOU HAVE QUESTIONS ABOUT YOUR PRE-OP INSTRUCTIONS PLEASE CALL [phone] [facility]  If you received a COVID test during your pre-op visit  it is requested that you wear a mask when out in public, stay away from anyone that may not be feeling well and notify your surgeon if you develop symptoms. If you test positive for Covid or have  been in contact with anyone that has tested positive in the last 10 days please notify you surgeon.  [facility] - Preparing for Surgery Before surgery, you can play an important role.  Because skin is not sterile, your skin needs to be as free of germs as possible.  You can reduce the number of germs on your skin by washing with CHG (chlorahexidine gluconate) soap before surgery.  CHG is an antiseptic cleaner which kills germs and bonds with the skin to continue killing germs even after washing. Please DO NOT use if you have an allergy to CHG or antibacterial soaps.  If your skin becomes reddened/irritated stop using the CHG and inform your nurse when you arrive at Short Stay. Do not shave (including legs and underarms) for at least 48 hours prior to the first CHG shower.  You may shave your face/neck.  Please follow these instructions carefully:  1.  Shower with CHG Soap the night before surgery and the  morning of surgery.  2.  If you choose to wash your hair, wash your hair first as usual with your normal  shampoo.  3.  After you shampoo, rinse your hair and body thoroughly to remove the shampoo.                              4.  Use CHG as you would any other liquid soap.  You can apply chg directly to the skin and wash.  Gently with a scrungie or clean washcloth.  5.  Apply the CHG Soap to your body ONLY FROM THE NECK DOWN.   Do   not use on face/ open                           Wound or open sores. Avoid contact with eyes, ears mouth and   genitals (private parts).                       Wash face,  Genitals (private parts) with your normal soap.             6.  Wash thoroughly, paying special attention to the area where your    surgery  will be performed.  7.  Thoroughly rinse your body with warm water  from the neck down.  8.  DO NOT shower/wash with your normal soap after using and rinsing off the CHG Soap.                9.  Pat yourself dry with a clean towel.            10.  Wear clean pajamas.            11.  Place clean sheets on your bed the night of your first shower and do not  sleep with pets. Day of Surgery : Do not apply any lotions/deodorants the morning of surgery.  Please wear clean clothes to the hospital/surgery center.  FAILURE TO FOLLOW THESE INSTRUCTIONS MAY RESULT IN THE CANCELLATION OF YOUR SURGERY  PATIENT SIGNATURE_________________________________  NURSE SIGNATURE__________________________________  ________________________________________________________________________

## 2024-05-07 NOTE — Telephone Encounter (Signed)
 Tried to cal pt wife to advise per provider to go to the ED to get eval due to his fall and confused. Left detail message to call office back if any questions or concerns.

## 2024-05-07 NOTE — Telephone Encounter (Signed)
 Copied from CRM [id number]. Topic: Clinical - Home Health Verbal Orders >> May 07, 2024  8:45 AM Emylou G wrote: Caller/Agency: melissa w/[facility] Callback Number: [phone] secured line Service Requested: mobile xray Frequency: verbal order - he had a fall Monday.. lost range of motion in shoulder / elbow - left side Any new concerns about the patient? No   Spoke with eleanor letting her know pt needs to come in for appt or go to urgent care she states pt refused to go anywhere just mobile xray that [facility]  can get to come to his house. Pt is very confused at times and does not want to leave his house.

## 2024-05-09 ENCOUNTER — Other Ambulatory Visit: Payer: Self-pay | Admitting: Internal Medicine

## 2024-05-09 DIAGNOSIS — F3131 Bipolar disorder, current episode depressed, mild: Secondary | ICD-10-CM

## 2024-05-11 DIAGNOSIS — R338 Other retention of urine: Secondary | ICD-10-CM | POA: Diagnosis not present

## 2024-05-13 ENCOUNTER — Other Ambulatory Visit: Payer: Self-pay

## 2024-05-13 ENCOUNTER — Encounter (HOSPITAL_COMMUNITY)
Admission: RE | Admit: 2024-05-13 | Discharge: 2024-05-13 | Disposition: A | Discharge status: Home / Self Care | Source: Ambulatory Visit | Attending: Urology | Admitting: Urology

## 2024-05-13 ENCOUNTER — Encounter (HOSPITAL_COMMUNITY): Payer: Self-pay

## 2024-05-13 VITALS — BP 128/84 | HR 97 | Temp 98.2°F | Resp 18 | Ht 73.0 in | Wt 212.0 lb

## 2024-05-13 DIAGNOSIS — D649 Anemia, unspecified: Secondary | ICD-10-CM

## 2024-05-13 DIAGNOSIS — I2510 Atherosclerotic heart disease of native coronary artery without angina pectoris: Secondary | ICD-10-CM | POA: Insufficient documentation

## 2024-05-13 DIAGNOSIS — Z01812 Encounter for preprocedural laboratory examination: Secondary | ICD-10-CM | POA: Insufficient documentation

## 2024-05-13 DIAGNOSIS — F0394 Unspecified dementia, unspecified severity, with anxiety: Secondary | ICD-10-CM | POA: Insufficient documentation

## 2024-05-13 DIAGNOSIS — N401 Enlarged prostate with lower urinary tract symptoms: Secondary | ICD-10-CM | POA: Insufficient documentation

## 2024-05-13 DIAGNOSIS — I509 Heart failure, unspecified: Secondary | ICD-10-CM | POA: Insufficient documentation

## 2024-05-13 DIAGNOSIS — G4733 Obstructive sleep apnea (adult) (pediatric): Secondary | ICD-10-CM | POA: Insufficient documentation

## 2024-05-13 DIAGNOSIS — I340 Nonrheumatic mitral (valve) insufficiency: Secondary | ICD-10-CM | POA: Insufficient documentation

## 2024-05-13 DIAGNOSIS — I130 Hypertensive heart and chronic kidney disease with heart failure and stage 1 through stage 4 chronic kidney disease, or unspecified chronic kidney disease: Secondary | ICD-10-CM | POA: Insufficient documentation

## 2024-05-13 DIAGNOSIS — R338 Other retention of urine: Secondary | ICD-10-CM | POA: Insufficient documentation

## 2024-05-13 DIAGNOSIS — I77810 Thoracic aortic ectasia: Secondary | ICD-10-CM | POA: Insufficient documentation

## 2024-05-13 DIAGNOSIS — N183 Chronic kidney disease, stage 3 unspecified: Secondary | ICD-10-CM | POA: Insufficient documentation

## 2024-05-13 HISTORY — DX: Benign prostatic hyperplasia without lower urinary tract symptoms: N40.0

## 2024-05-13 LAB — CBC
HCT: 30.5 % — ABNORMAL LOW (ref 39.0–52.0)
Hemoglobin: 9 g/dL — ABNORMAL LOW (ref 13.0–17.0)
MCH: 28.1 pg (ref 26.0–34.0)
MCHC: 29.5 g/dL — ABNORMAL LOW (ref 30.0–36.0)
MCV: 95.3 fL (ref 80.0–100.0)
Platelets: 190 K/uL (ref 150–400)
RBC: 3.2 MIL/uL — ABNORMAL LOW (ref 4.22–5.81)
RDW: 16.9 % — ABNORMAL HIGH (ref 11.5–15.5)
WBC: 6.2 K/uL (ref 4.0–10.5)
nRBC: 0 % (ref 0.0–0.2)

## 2024-05-13 LAB — BASIC METABOLIC PANEL WITH GFR
Anion gap: 10 (ref 5–15)
BUN: 34 mg/dL — ABNORMAL HIGH (ref 8–23)
CO2: 24 mmol/L (ref 22–32)
Calcium: 9.1 mg/dL (ref 8.9–10.3)
Chloride: 107 mmol/L (ref 98–111)
Creatinine, Ser: 1.49 mg/dL — ABNORMAL HIGH (ref 0.61–1.24)
GFR, Estimated: 49 mL/min — ABNORMAL LOW (ref >60)
Glucose, Bld: 83 mg/dL (ref 70–99)
Potassium: 4.5 mmol/L (ref 3.5–5.1)
Sodium: 142 mmol/L (ref 135–145)

## 2024-05-14 NOTE — Progress Notes (Signed)
 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  Patient underwent LAR, colovesical fistula takedown, LOA, and ileostomy creation on 01/09/24. No anesthesia complications noted. Hospital course uncomplicated. Discharged to SNF.  Admitted from 9/3-02/09/24 for sepsis due to UTI and AKI on CKD. Treated with abx and discharged to SNF.  Patient hospitalized from 10/3-10/17 for AMS at SNF. Found again to be have AKI with metabolic encephalopathy.  Thought to be due to high output ileostomy so on 10/9 he underwent ileostomy takedown and afterwards his kidney function returned to normal.  He had worsening anemia after surgery and black stools. Patient was seen by GI and underwent EGD which was normal.    Seen by PCP in follow up on 11/10. Patient mostly had cognition issues and reported he wanted his foley out  Seen again by PCP on 11/19 for pre op exam. Per Dr. Jesus:  Surgery can proceed with close monitoring. No additional preoperative cardiac testing is recommended at this time based on the patient's low risk profile and current guidelines, specifically the 2022 ACC/AHA Guideline on Perioperative Cardiovascular Evaluation for Noncardiac Surgery and relevant UpToDate summaries The patient was instructed to hold medications on the morning of surgery and to arrive  prior to the scheduled procedure.  Patient follows with Cardiology for HTN, CHF. Pt seen  by cardiology during July admission. He had last been seen in cardio clinic 10/17/23 for preoperative evaluation. 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  Per Dr. Wonda on 7/11: Patient denies chest pain. Although he has low activity levels, he had a cath in 2021 that showed normal coronary arteries. He does not need repeat ischemic evaluation prior to surgery. OK to cancel stress test that was previously ordered and proceed with surgery once optimized from a medical standpoint   At PAT visit patient reports he can do stairs without CP/SOB. Anticipate he can proceed  VS: BP 128/84   Pulse 97   Temp 36.8 C (Oral)   Resp 18   Ht 6' 1 (1.854 m)   Wt 96.2 kg   BMI 27.97 kg/m   PROVIDERS: [name], MD Cardiologist - Wilbert Bihari, MD    LABS: Labs reviewed: Acceptable for surgery. CKD, anemia stable (all labs ordered are listed, but only abnormal results are displayed)  Labs Reviewed  BASIC METABOLIC PANEL WITH GFR - Abnormal; Notable for the following components:      Result Value   BUN 34 (*)    Creatinine, Ser 1.49 (*)    GFR, Estimated 49 (*)    All other components within normal limits  CBC - Abnormal; Notable for the following components:   RBC 3.20 (*)    Hemoglobin 9.0 (*)    HCT 30.5 (*)    MCHC 29.5 (*)    RDW 16.9 (*)    All other components  within normal limits    Echo 12/13/23:  IMPRESSIONS    1. Left ventricular ejection fraction, by estimation, is 25 to 30%. The left ventricle has severely decreased function. The left ventricle demonstrates global hypokinesis. There is mild left ventricular hypertrophy. Left ventricular diastolic parameters  are indeterminate.  2. Right ventricular systolic function is normal. The right ventricular size is normal. There is mildly elevated pulmonary  artery systolic pressure. The estimated right ventricular systolic pressure is 43.9 mmHg.  3. The mitral valve is normal in structure. Mild to moderate mitral valve regurgitation.  4. The aortic valve is tricuspid. Aortic valve regurgitation is not visualized. No aortic stenosis is present.  5. Aortic dilatation noted. There is dilatation of the ascending aorta, measuring 41 mm.  6. The inferior vena cava is dilated in size with <50% respiratory variability, suggesting right atrial pressure of 15 mmHg.  R/L heart cath 07/03/2019:   Assessment:   1. Normal coronaries 2. NICM with EF 25-30% 3. Normal hemodynamics   Plan/Discussion:   Medical therapy.  Suspect CM may be related to OSA. Hemodynamics surprisingly normal.   Past Medical History:  Diagnosis Date   Acute cystitis 05/13/2023   Acute hyponatremia 05/13/2023   Acute kidney injury superimposed on chronic kidney disease 05/13/2023   CLINICAL CONTEXT: 73 year old male with stage 3 CKD, colovesical fistula, recurrent UTIs, and recent candidal UTI. Recent renal ultrasound (09/28/2023) confirmed mild left hydronephrosis and prostatic mass protruding into bladder base. Iron  studies consistent with anemia of chronic disease (iron  32, TIBC 238, ferritin 420).   DIAGNOSTIC ASSESSMENT: ? 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 History of GI bleeding (07/2023). Currently on  ferrous sulfate  supplementation.   DIAGNOSTIC ASSESSMENT: ? Primary etiology (70-80% probability): Anemia of chronic disease/inflammation ? 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  EF 35 to 40% on echo 04/2021   Dementia (HCC)    Depression    Diarrhea 05/13/2023   E coli bacteremia 05/14/2023   High anion gap metabolic acidosis 07/13/2023   History of Clostridioides difficile colitis 08/15/2023   Associated with colovesical fistula? Recurrent antibiotic(s) requirement     Hyperkalemia    Associated with losartan   Advised patient low potassium diet 08/2022             Lab Results      Component    Value    Date/Time           K    4.0    12/27/2023 01:20 PM           K    4.4    12/18/2023 05:07 AM  K    4.4    12/17/2023 06:01 AM           K    4.7    12/16/2023 06:15 AM           K    4.9    12/15/2023 08:21 AM           K    4.4    12/14/2023 06:00 AM           K    5.0    Hyperlipidemia    Hypertension    Leukocytosis 12/11/2023   Lab Results      Component    Value    Date/Time           WBC    6.2    12/27/2023 01:20 PM           WBC    11.6 (H)    12/18/2023 05:07 AM           WBC    9.6    12/17/2023 06:01 AM           WBC    9.4    12/16/2023 06:15 AM           WBC    12.7 (H)    12/15/2023 08:21 AM           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  Work-up with cardiology in January 2021.     catheterization    Assessment:     1. Normal coronaries  2. NICM with EF 25-30%  3. Normal hemodynamics  .     Pneumonia    PUD (peptic ulcer disease)  01/12/2015   Formatting of this note might be different from the original.  Endoscopy esophageal stricture 2016     Septic shock from UTI 06/02/2023   Severe sepsis (HCC) 06/03/2023   SIRS (systemic inflammatory response syndrome) (HCC) 12/11/2023   SKIN CANCER, HX OF 05/10/2007   Facial left check and forehead and r shoulder  F/w derm   Syncope 05/13/2023   Thrombocytopenia 06/02/2023   Lab Results      Component    Value    Date           PLT    301    07/19/2023           PLT    324    07/18/2023           PLT    268    07/16/2023           PLT    207    07/14/2023           PLT    228    07/13/2023             Lab Results      Component    Value    Date           ESRSEDRATE    60 (H)    05/08/2023     No results found for: CRP          Lab Results      Component    Value       Urinary catheter complication 06/24/2023   Urinary dribbling 03/14/2022   Urinary incontinence 11/21/2022  Urinary Incontinence: They are wearing disposable diapers daily due to urinary incontinence, primarily nocturnal, and are currently on Tamsulosin  (Flomax ). We will refer them to Urology for further evaluation and potential treatment options and continue Tamsulosin  until they are seen by Urology.     Urinary tract infection without hematuria 06/02/2023   UTI (urinary tract infection) 07/01/2023    Past Surgical History:  Procedure Laterality Date   COLECTOMY, SIGMOID, ROBOT-ASSISTED N/A 01/09/2024   Procedure: ROBOTIC LOW ANTERIOR RESECTION WITH ANASTOMOSIS, DRAINAGE OF PELVIC ABSCESS, COLOVESICAL FISTULA TAKEDOWN AND REPAIR, INTRAOPERATIVE ASSESSMENT OF TISSUE PERFUSION USING ICG DYE, AND BILATERAL TAP BLOCK;  Surgeon: Sheldon Standing, MD;  Location[facility];  Service: General;  Laterality: N/A;  ROBOTIC RESECTION OF COLON RECTOSIGMOID   COLONOSCOPY     COLONOSCOPY N/A 12/05/2023   Procedure: COLONOSCOPY;  Surgeon: Leigh Standing SQUIBB, MD;  [facility]: [facility] ENDOSCOPY;  Service: Gastroenterology;  Laterality: N/A;    CYSTOSCOPY WITH INDOCYANINE GREEN  IMAGING (ICG) N/A 01/09/2024   Procedure: CYSTOSCOPY WITH INDOCYANINE GREEN  IMAGING (ICG);  Surgeon: Watt Rush, MD;  Location: [facility];  Service: Urology;  Laterality: N/A;  CYSTO WITH FIREFLY INJECTION   ESOPHAGOGASTRODUODENOSCOPY N/A 03/17/2024   Procedure: EGD (ESOPHAGOGASTRODUODENOSCOPY);  Surgeon: Nandigam, Kavitha V, MD;  Location: [facility] ENDOSCOPY;  Service: Gastroenterology;  Laterality: N/A;   FLEXIBLE SIGMOIDOSCOPY N/A 01/09/2024   Procedure: KINGSTON SIDE;  Surgeon: Sheldon Standing, MD;  Location: [facility];  Service: General;  Laterality: N/A;   ILEOSTOMY CLOSURE N/A 03/12/2024   Procedure: CLOSURE, ILEOSTOMY;  Surgeon: Sheldon Standing, MD;  Location: [facility];  Service: General;  Laterality: N/A;  GEN WITH ERAS PATHWAY   LAPAROSCOPIC LOOP COLOSTOMY N/A 01/09/2024   Procedure: CREATION OF DIVERTING LOOP ILEOSTOMY;  Surgeon: Sheldon Standing, MD;  Location: [facility];  Service: General;  Laterality: N/A;   RECTAL EXAM UNDER ANESTHESIA N/A 03/12/2024   Procedure: EXAM UNDER ANESTHESIA, RECTUM;  Surgeon: Sheldon Standing, MD;  Location: [facility];  Service: General;  Laterality: N/A;   RIGHT/LEFT HEART CATH AND CORONARY ANGIOGRAPHY N/A 07/03/2019   Procedure: RIGHT/LEFT HEART CATH AND CORONARY ANGIOGRAPHY;  Surgeon: Cherrie Toribio SAUNDERS, MD;  Location: [facility] INVASIVE CV LAB;  Service: Cardiovascular;  Laterality: N/A;   ROBOTIC ASSISTED LAPAROSCOPIC LYSIS OF ADHESION  01/09/2024   Procedure: LYSIS, ADHESIONS, ROBOT-ASSISTED, LAPAROSCOPIC;  Surgeon: Sheldon Standing, MD;  Location: [facility];  Service: General;;    MEDICATIONS:  ARIPiprazole  (ABILIFY ) 2 MG tablet   Ascorbic Acid (VITAMIN C PO)   busPIRone  (BUSPAR ) 10 MG tablet   CALCIUM  PO   ferrous sulfate  325 (65 FE) MG tablet   finasteride  (PROSCAR ) 5 MG tablet   folic acid  (FOLVITE ) 1 MG tablet   gabapentin  (NEURONTIN ) 100 MG capsule   loperamide  (IMODIUM  A-D) 2 MG tablet   magic mouthwash SOLN   mirtazapine  (REMERON ) 30 MG  tablet   primidone  (MYSOLINE ) 50 MG tablet   tamsulosin  (FLOMAX ) 0.4 MG CAPS capsule   venlafaxine  XR (EFFEXOR -XR) 150 MG 24 hr capsule   No current facility-administered medications for this encounter.   Burnard CHRISTELLA Odis DEVONNA [facility] Short Stay/Anesthesiology [facility] Phone [phone] 05/19/2024 11:48 AM

## 2024-05-19 NOTE — Anesthesia Preprocedure Evaluation (Signed)
 Anesthesia Evaluation    Airway        Dental   Pulmonary           Cardiovascular hypertension,      Neuro/Psych    GI/Hepatic   Endo/Other    Renal/GU      Musculoskeletal   Abdominal   Peds  Hematology   Anesthesia Other Findings   Reproductive/Obstetrics                              Anesthesia Physical Anesthesia Plan  ASA:   Anesthesia Plan:    Post-op Pain Management:    Induction:   PONV Risk Score and Plan:   Airway Management Planned:   Additional Equipment:   Intra-op Plan:   Post-operative Plan:   Informed Consent:   Plan Discussed with:   Anesthesia Plan Comments: (See PAT note from 12/10 )         Anesthesia Quick Evaluation

## 2024-05-20 ENCOUNTER — Encounter (HOSPITAL_COMMUNITY)

## 2024-05-22 ENCOUNTER — Ambulatory Visit: Payer: Self-pay

## 2024-05-22 ENCOUNTER — Emergency Department (HOSPITAL_COMMUNITY)

## 2024-05-22 ENCOUNTER — Encounter (HOSPITAL_COMMUNITY): Payer: Self-pay

## 2024-05-22 ENCOUNTER — Other Ambulatory Visit: Payer: Self-pay

## 2024-05-22 ENCOUNTER — Emergency Department (HOSPITAL_COMMUNITY)
Admission: EM | Admit: 2024-05-22 | Discharge: 2024-05-22 | Disposition: A | Discharge status: Home / Self Care | Attending: Emergency Medicine | Admitting: Emergency Medicine

## 2024-05-22 DIAGNOSIS — F0390 Unspecified dementia without behavioral disturbance: Secondary | ICD-10-CM | POA: Diagnosis not present

## 2024-05-22 DIAGNOSIS — W010XXA Fall on same level from slipping, tripping and stumbling without subsequent striking against object, initial encounter: Secondary | ICD-10-CM | POA: Diagnosis not present

## 2024-05-22 DIAGNOSIS — S52125A Nondisplaced fracture of head of left radius, initial encounter for closed fracture: Secondary | ICD-10-CM | POA: Insufficient documentation

## 2024-05-22 DIAGNOSIS — M25522 Pain in left elbow: Secondary | ICD-10-CM | POA: Diagnosis not present

## 2024-05-22 DIAGNOSIS — Y92810 Car as the place of occurrence of the external cause: Secondary | ICD-10-CM | POA: Insufficient documentation

## 2024-05-22 DIAGNOSIS — I509 Heart failure, unspecified: Secondary | ICD-10-CM | POA: Insufficient documentation

## 2024-05-22 DIAGNOSIS — S59902A Unspecified injury of left elbow, initial encounter: Secondary | ICD-10-CM | POA: Diagnosis present

## 2024-05-22 LAB — CBC
HCT: 33 % — ABNORMAL LOW (ref 39.0–52.0)
Hemoglobin: 9.8 g/dL — ABNORMAL LOW (ref 13.0–17.0)
MCH: 28.5 pg (ref 26.0–34.0)
MCHC: 29.7 g/dL — ABNORMAL LOW (ref 30.0–36.0)
MCV: 95.9 fL (ref 80.0–100.0)
Platelets: 196 K/uL (ref 150–400)
RBC: 3.44 MIL/uL — ABNORMAL LOW (ref 4.22–5.81)
RDW: 16.2 % — ABNORMAL HIGH (ref 11.5–15.5)
WBC: 5.3 K/uL (ref 4.0–10.5)
nRBC: 0 % (ref 0.0–0.2)

## 2024-05-22 LAB — BASIC METABOLIC PANEL WITH GFR
Anion gap: 10 (ref 5–15)
BUN: 36 mg/dL — ABNORMAL HIGH (ref 8–23)
CO2: 24 mmol/L (ref 22–32)
Calcium: 9.3 mg/dL (ref 8.9–10.3)
Chloride: 105 mmol/L (ref 98–111)
Creatinine, Ser: 1.59 mg/dL — ABNORMAL HIGH (ref 0.61–1.24)
GFR, Estimated: 45 mL/min — ABNORMAL LOW
Glucose, Bld: 98 mg/dL (ref 70–99)
Potassium: 5.1 mmol/L (ref 3.5–5.1)
Sodium: 139 mmol/L (ref 135–145)

## 2024-05-22 NOTE — Telephone Encounter (Signed)
 FYI Only or Action Required?: FYI only for provider: ED advised.  Patient was last seen in primary care on 04/22/2024 by [name], MD.  Called Nurse Triage reporting Elbow Injury and Fall.  Symptoms began several weeks ago.  Interventions attempted: Nothing.  Symptoms are: gradually worsening.  Triage Disposition: Go to ED Now (Notify PCP)  Patient/caregiver understands and will follow disposition?: Yes                     Copied from CRM [id number]. Topic: Clinical - Red Word Triage >> May 22, 2024 11:33 AM Berneda FALCON wrote: Red Word that prompted transfer to Nurse Triage: Melissa PT with [facility] is calling to get patient an appt for the soreness and pain in his left elbow. She recommended an Xray for the patient a couple of weeks back but he declined it. Since then, he has continued to have pain in the left elbow and has displayed an increase in falling with the most recent being 12/11. Reason for Disposition  Injury (or injuries) that need emergency care  Answer Assessment - Initial Assessment Questions Melissa PT  with patient and patient family in room calls to update on patient status discussing patient R elbow s/p fall 2.5 weeks ago and also report 2 additional falls since then, reporting concerns patient is not eatting well, and wanting to stay in bed during the day, and is not himself. Has surgery Tuesday and is worried about him being cleared for that. Mention patient has a catheter he wants to get out as well. This clinical research associate advised ER for evaluation due to increased falls and family reporting patient not acting himself this RN experienced increased periods of agitation from patient while on this call.     1. MECHANISM: How did the fall happen?     2.5 weeks ago had a fall in kitchen injury R elbow and hit head at this time, pt refused mobile XR per [facility] PT report and declined PCP appt at the time   3. ONSET: When did the fall happen? (e.g.,  minutes, hours, or days ago)     2.5 weeks ago, and 2 more falls since original fall  4. LOCATION: What part of the body hit the ground? (e.g., back, buttocks, head, hips, knees, hands, head, stomach)     R elbow melissa PT is at pts home with pt and son in room and re assessing elbow states  unable to push  up, 20 degree loss of motion  4/10 pain , any weight and movement and ,moderate pain full extension hurts and will stop  Also reported hit head with initial fall 2.5 weeks ago  6. PAIN: Is there any pain? If Yes, ask: How bad is the pain? (e.g., Scale 0-10; or none, mild,      Pain for elbow 4/10 at rest and increases with  weight and movement   9. OTHER SYMPTOMS: Do you have any other symptoms? (e.g., dizziness, fever, weakness; new-onset or worsening).      Family reporting he is not himself per Melissa PT, reports only eating sugary foods drinking sugary drinks, lying  in bed most of the day , lethargic ,  Vitals WNL- per [facility] PT   Denies chest pain, shortness of breath, fever  Protocols used: Falls and [facility]

## 2024-05-22 NOTE — ED Triage Notes (Signed)
 Patient has had increased falls over the last few weeks. Pain is in his left elbow. Said he possibly hit his head. Denies blood thinners or LOC. Increased weakness.

## 2024-05-22 NOTE — ED Notes (Signed)
 Patient transported to X-ray

## 2024-05-22 NOTE — Discharge Instructions (Signed)
 Call the orthopedic doctor's office to schedule a follow-up appointment.  Keep the splint on at all times.

## 2024-05-22 NOTE — Progress Notes (Signed)
 Orthopedic Tech Progress Note Patient Details:  Laronn Devonshire [facility] 02/02/1951 [id number]  Ortho Devices Type of Ortho Device: Arm sling, Long arm splint Ortho Device/Splint Location: LUE Ortho Device/Splint Interventions: Application   Post Interventions Patient Tolerated: Well  Massie BRAVO Jakaree Pickard 05/22/2024, 4:34 PM

## 2024-05-22 NOTE — ED Provider Notes (Incomplete)
 " [place] EMERGENCY DEPARTMENT AT [facility] Provider Note   CSN: [id number] Arrival date & time: 05/22/24  1235     Patient presents with: Increased Falls   Calvin Escobar is a 73 y.o. male.  {Add pertinent medical, surgical, social history, OB history to HPI:32947} HPI   Patient has a history of depression, dementia, dilated cardiomyopathy, hyponatremia, metabolic encephalopathy, sepsis, CHF.  Patient presents to the ED with complaints of left elbow pain.  Patient reports having a couple of falls over the last few weeks.  Patient states he has stumbled getting out of a car.  He tripped on the side of a curb.  Patient denies any loss of consciousness.  He is not feeling weak.  He has not had fevers or chills.  He is not having chest pain.  No abdominal pain.  Patient however states that for last couple weeks he has noted some discomfort in his elbow.  He feels like he is elbow is weaker than usual and it is harder to move.  He thinks it has been slowly improving.  His white convinced him to come today to get evaluated.  Prior to Admission medications  Medication Sig Start Date End Date Taking? Authorizing Provider  ARIPiprazole  (ABILIFY ) 2 MG tablet Take 2 mg by mouth in the morning.    [provider]  Ascorbic Acid (VITAMIN C PO) Take 1 tablet by mouth daily.    [provider]  busPIRone  (BUSPAR ) 10 MG tablet Take 1 tablet (10 mg total) by mouth 3 (three) times daily. 09/16/23   [name], MD  CALCIUM  PO Take 1 tablet by mouth in the morning and at bedtime.    [provider]  ferrous sulfate  325 (65 FE) MG tablet Take 1 tablet (325 mg total) by mouth 2 (two) times daily with a meal. 01/14/24   Sheldon Standing, MD  finasteride  (PROSCAR ) 5 MG tablet Take 5 mg by mouth daily. 11/06/23   [provider]  folic acid  (FOLVITE ) 1 MG tablet TAKE 1 TABLET BY MOUTH EVERY DAY 04/17/24   [name], MD  gabapentin  (NEURONTIN ) 100 MG  capsule TAKE 1 CAPSULE (100 MG TOTAL) BY MOUTH THREE TIMES DAILY. 05/09/24   [name], MD  loperamide  (IMODIUM  A-D) 2 MG tablet Take 6 mg by mouth 4 (four) times daily.    [provider]  magic mouthwash SOLN Take 15 mLs by mouth 4 (four) times daily as needed for mouth pain (sore throat). Suspension contains equal amounts of Maalox Extra Strength, nystatin , and diphenhydramine . Patient not taking: No sig reported 03/20/24   [name], DO  mirtazapine  (REMERON ) 30 MG tablet Take 30 mg by mouth at bedtime. 05/09/24   [provider]  primidone  (MYSOLINE ) 50 MG tablet Take 1 tablet (50 mg total) by mouth 2 (two) times daily. 06/11/23   Krishnan, Gokul, MD  tamsulosin  (FLOMAX ) 0.4 MG CAPS capsule Take 0.4 mg by mouth in the morning and at bedtime.    [provider]  venlafaxine  XR (EFFEXOR -XR) 150 MG 24 hr capsule Take 150 mg by mouth daily.    [provider]    Allergies: Albuterol     Review of Systems  Updated Vital Signs BP (!) 145/88 (BP Location: Left Arm)   Pulse 94   Temp 97.8 F (36.6 C) (Oral)   Resp 16   Ht 1.854 m (6' 1)   Wt 95.3 kg   SpO2 97%  BMI 27.71 kg/m   Physical Exam Vitals and nursing note reviewed.  Constitutional:      General: He is not in acute distress.    Appearance: He is well-developed.  HENT:     Head: Normocephalic and atraumatic.     Right Ear: External ear normal.     Left Ear: External ear normal.  Eyes:     General: No scleral icterus.       Right eye: No discharge.        Left eye: No discharge.     Conjunctiva/sclera: Conjunctivae normal.  Neck:     Trachea: No tracheal deviation.  Cardiovascular:     Rate and Rhythm: Normal rate and regular rhythm.  Pulmonary:     Effort: Pulmonary effort is normal. No respiratory distress.     Breath sounds: Normal breath sounds. No stridor. No wheezing or rales.  Abdominal:     General: Bowel sounds are normal. There is no distension.      Palpations: Abdomen is soft.     Tenderness: There is no abdominal tenderness. There is no guarding or rebound.  Musculoskeletal:        General: Tenderness present. No deformity.     Left shoulder: Normal.     Left upper arm: Normal.     Left elbow: No deformity. Decreased range of motion. Tenderness present.     Cervical back: Normal and neck supple.     Thoracic back: Normal.     Lumbar back: Normal.  Skin:    General: Skin is warm and dry.     Findings: No rash.  Neurological:     General: No focal deficit present.     Mental Status: He is alert.     Cranial Nerves: No cranial nerve deficit, dysarthria or facial asymmetry.     Sensory: No sensory deficit.     Motor: No weakness, abnormal muscle tone or seizure activity.     Coordination: Coordination normal.     Comments: Equal grip strength bilaterally, normal lower extremity strength, normal sensation  Psychiatric:        Mood and Affect: Mood normal.     (all labs ordered are listed, but only abnormal results are displayed) Labs Reviewed  CBC - Abnormal; Notable for the following components:      Result Value   RBC 3.44 (*)    Hemoglobin 9.8 (*)    HCT 33.0 (*)    MCHC 29.7 (*)    RDW 16.2 (*)    All other components within normal limits  BASIC METABOLIC PANEL WITH GFR - Abnormal; Notable for the following components:   BUN 36 (*)    Creatinine, Ser 1.59 (*)    GFR, Estimated 45 (*)    All other components within normal limits    EKG: None  Radiology: No results found.  {Document cardiac monitor, telemetry assessment procedure when appropriate:32947} Procedures   Medications Ordered in the ED - No data to display    {Click here for ABCD2, HEART and other calculators REFRESH Note before signing:1}                              Medical Decision Making Amount and/or Complexity of Data Reviewed Labs: ordered. Radiology: ordered.   ***  {Document critical care time when appropriate  Document review  of labs and clinical decision tools ie CHADS2VASC2, etc  Document your independent review of radiology images  and any outside records  Document your discussion with family members, caretakers and with consultants  Document social determinants of health affecting pt's care  Document your decision making why or why not admission, treatments were needed:32947:::1}   Final diagnoses:  None    ED Discharge Orders     None        "

## 2024-05-22 NOTE — Telephone Encounter (Signed)
 Pt currently being seen in [facility]

## 2024-05-25 ENCOUNTER — Encounter (HOSPITAL_COMMUNITY): Payer: Self-pay | Admitting: Medical

## 2024-05-25 ENCOUNTER — Encounter (HOSPITAL_COMMUNITY): Payer: Self-pay | Admitting: Urology

## 2024-05-25 ENCOUNTER — Ambulatory Visit (HOSPITAL_COMMUNITY)
Admission: RE | Admit: 2024-05-25 | Discharge: 2024-05-25 | Disposition: A | Discharge status: Home / Self Care | Source: Ambulatory Visit | Attending: Urology | Admitting: Urology

## 2024-05-25 ENCOUNTER — Ambulatory Visit (HOSPITAL_COMMUNITY): Admitting: Anesthesiology

## 2024-05-25 ENCOUNTER — Encounter (HOSPITAL_COMMUNITY): Admission: RE | Disposition: A | Payer: Self-pay | Source: Ambulatory Visit | Attending: Urology

## 2024-05-25 DIAGNOSIS — N183 Chronic kidney disease, stage 3 unspecified: Secondary | ICD-10-CM | POA: Diagnosis not present

## 2024-05-25 DIAGNOSIS — I509 Heart failure, unspecified: Secondary | ICD-10-CM | POA: Insufficient documentation

## 2024-05-25 DIAGNOSIS — Z960 Presence of urogenital implants: Secondary | ICD-10-CM | POA: Diagnosis not present

## 2024-05-25 DIAGNOSIS — F0390 Unspecified dementia without behavioral disturbance: Secondary | ICD-10-CM | POA: Insufficient documentation

## 2024-05-25 DIAGNOSIS — I130 Hypertensive heart and chronic kidney disease with heart failure and stage 1 through stage 4 chronic kidney disease, or unspecified chronic kidney disease: Secondary | ICD-10-CM

## 2024-05-25 DIAGNOSIS — N401 Enlarged prostate with lower urinary tract symptoms: Secondary | ICD-10-CM | POA: Insufficient documentation

## 2024-05-25 DIAGNOSIS — I5022 Chronic systolic (congestive) heart failure: Secondary | ICD-10-CM

## 2024-05-25 DIAGNOSIS — R338 Other retention of urine: Secondary | ICD-10-CM | POA: Diagnosis present

## 2024-05-25 DIAGNOSIS — N1831 Chronic kidney disease, stage 3a: Secondary | ICD-10-CM

## 2024-05-25 DIAGNOSIS — D631 Anemia in chronic kidney disease: Secondary | ICD-10-CM | POA: Diagnosis not present

## 2024-05-25 DIAGNOSIS — N400 Enlarged prostate without lower urinary tract symptoms: Secondary | ICD-10-CM

## 2024-05-25 DIAGNOSIS — Z87440 Personal history of urinary (tract) infections: Secondary | ICD-10-CM | POA: Insufficient documentation

## 2024-05-25 DIAGNOSIS — N320 Bladder-neck obstruction: Secondary | ICD-10-CM

## 2024-05-25 DIAGNOSIS — G4733 Obstructive sleep apnea (adult) (pediatric): Secondary | ICD-10-CM | POA: Insufficient documentation

## 2024-05-25 DIAGNOSIS — I447 Left bundle-branch block, unspecified: Secondary | ICD-10-CM | POA: Insufficient documentation

## 2024-05-25 HISTORY — PX: TRANSURETHRAL RESECTION OF PROSTATE: SHX73

## 2024-05-25 SURGERY — TURP (TRANSURETHRAL RESECTION OF PROSTATE)
Anesthesia: General | Site: Prostate

## 2024-05-25 MED ORDER — NEOSTIGMINE METHYLSULFATE 3 MG/3ML IV SOSY
PREFILLED_SYRINGE | INTRAVENOUS | Status: DC | PRN
  Administered 2024-05-25: 3.8 mg via INTRAVENOUS

## 2024-05-25 MED ORDER — FENTANYL CITRATE (PF) 100 MCG/2ML IJ SOLN
INTRAMUSCULAR | Status: DC | PRN
  Administered 2024-05-25 (×2): 50 ug via INTRAVENOUS

## 2024-05-25 MED ORDER — PROPOFOL 10 MG/ML IV BOLUS
INTRAVENOUS | Status: AC
  Filled 2024-05-25: qty 20

## 2024-05-25 MED ORDER — ORAL CARE MOUTH RINSE: 15.0000 mL | Freq: Once | OROMUCOSAL | Status: AC

## 2024-05-25 MED ORDER — PROPOFOL 10 MG/ML IV BOLUS
INTRAVENOUS | Status: DC | PRN
  Administered 2024-05-25: 150 mg via INTRAVENOUS
  Administered 2024-05-25: 100 ug/kg/min via INTRAVENOUS

## 2024-05-25 MED ORDER — ROCURONIUM BROMIDE 10 MG/ML (PF) SYRINGE
PREFILLED_SYRINGE | INTRAVENOUS | Status: DC | PRN
  Administered 2024-05-25: 30 mg via INTRAVENOUS

## 2024-05-25 MED ORDER — GLYCOPYRROLATE 0.2 MG/ML IJ SOLN
INTRAMUSCULAR | Status: DC | PRN
  Administered 2024-05-25: .6 mg via INTRAVENOUS

## 2024-05-25 MED ORDER — ACETAMINOPHEN 10 MG/ML IV SOLN: 1000.0000 mg | Freq: Once | INTRAVENOUS | Status: DC | PRN

## 2024-05-25 MED ORDER — LIDOCAINE HCL (PF) 2 % IJ SOLN
INTRAMUSCULAR | Status: AC
  Filled 2024-05-25: qty 5

## 2024-05-25 MED ORDER — CEFDINIR 300 MG PO CAPS: 300.0000 mg | ORAL_CAPSULE | Freq: Two times a day (BID) | ORAL | 0 refills | Status: DC

## 2024-05-25 MED ORDER — SUCCINYLCHOLINE CHLORIDE 200 MG/10ML IV SOSY
PREFILLED_SYRINGE | INTRAVENOUS | Status: DC | PRN
  Administered 2024-05-25: 100 mg via INTRAVENOUS

## 2024-05-25 MED ORDER — SODIUM CHLORIDE 0.9 % IR SOLN
Status: DC | PRN
  Administered 2024-05-25 (×13): 3000 mL via INTRAVESICAL

## 2024-05-25 MED ORDER — TRANEXAMIC ACID-NACL 1000-0.7 MG/100ML-% IV SOLN
1000.0000 mg | INTRAVENOUS | Status: AC
  Administered 2024-05-25: 1000 mg via INTRAVENOUS
  Filled 2024-05-25: qty 100

## 2024-05-25 MED ORDER — DROPERIDOL 2.5 MG/ML IJ SOLN: 0.6250 mg | Freq: Once | INTRAMUSCULAR | Status: DC | PRN

## 2024-05-25 MED ORDER — ACETAMINOPHEN 10 MG/ML IV SOLN
INTRAVENOUS | Status: DC | PRN
  Administered 2024-05-25: 1000 mg via INTRAVENOUS

## 2024-05-25 MED ORDER — SODIUM CHLORIDE 0.9 % IV SOLN
2.0000 g | INTRAVENOUS | Status: AC
  Administered 2024-05-25: 2 g via INTRAVENOUS
  Filled 2024-05-25: qty 20

## 2024-05-25 MED ORDER — PHENYLEPHRINE 80 MCG/ML (10ML) SYRINGE FOR IV PUSH (FOR BLOOD PRESSURE SUPPORT)
PREFILLED_SYRINGE | INTRAVENOUS | Status: DC | PRN
  Administered 2024-05-25 (×4): 80 ug via INTRAVENOUS
  Administered 2024-05-25 (×2): 160 ug via INTRAVENOUS
  Administered 2024-05-25 (×2): 80 ug via INTRAVENOUS

## 2024-05-25 MED ORDER — SUGAMMADEX SODIUM 200 MG/2ML IV SOLN
INTRAVENOUS | Status: AC
  Filled 2024-05-25: qty 2

## 2024-05-25 MED ORDER — PHENYLEPHRINE 80 MCG/ML (10ML) SYRINGE FOR IV PUSH (FOR BLOOD PRESSURE SUPPORT)
PREFILLED_SYRINGE | INTRAVENOUS | Status: AC
  Filled 2024-05-25: qty 10

## 2024-05-25 MED ORDER — OXYCODONE HCL 5 MG PO TABS: 5.0000 mg | ORAL_TABLET | Freq: Once | ORAL | Status: DC | PRN

## 2024-05-25 MED ORDER — PHENYLEPHRINE HCL (PRESSORS) 10 MG/ML IV SOLN
INTRAVENOUS | Status: AC
  Filled 2024-05-25: qty 1

## 2024-05-25 MED ORDER — LIDOCAINE HCL (PF) 2 % IJ SOLN
INTRAMUSCULAR | Status: DC | PRN
  Administered 2024-05-25: 100 mg via INTRADERMAL

## 2024-05-25 MED ORDER — EPHEDRINE SULFATE (PRESSORS) 25 MG/5ML IV SOSY
PREFILLED_SYRINGE | INTRAVENOUS | Status: DC | PRN
  Administered 2024-05-25 (×2): 10 mg via INTRAVENOUS

## 2024-05-25 MED ORDER — CHLORHEXIDINE GLUCONATE 0.12 % MT SOLN
15.0000 mL | Freq: Once | OROMUCOSAL | Status: AC
  Administered 2024-05-25: 15 mL via OROMUCOSAL

## 2024-05-25 MED ORDER — EPHEDRINE 5 MG/ML INJ
INTRAVENOUS | Status: AC
  Filled 2024-05-25: qty 5

## 2024-05-25 MED ORDER — GLYCOPYRROLATE 0.2 MG/ML IJ SOLN
INTRAMUSCULAR | Status: AC
  Filled 2024-05-25: qty 2

## 2024-05-25 MED ORDER — PHENYLEPHRINE HCL-NACL 20-0.9 MG/250ML-% IV SOLN
INTRAVENOUS | Status: DC | PRN
  Administered 2024-05-25: 100 ug/min via INTRAVENOUS

## 2024-05-25 MED ORDER — LACTATED RINGERS IV SOLN: INTRAVENOUS | Status: DC

## 2024-05-25 MED ORDER — OXYCODONE HCL 5 MG/5ML PO SOLN: 5.0000 mg | Freq: Once | ORAL | Status: DC | PRN

## 2024-05-25 MED ORDER — FENTANYL CITRATE (PF) 100 MCG/2ML IJ SOLN
INTRAMUSCULAR | Status: AC
  Filled 2024-05-25: qty 2

## 2024-05-25 MED ORDER — FENTANYL CITRATE (PF) 50 MCG/ML IJ SOSY: 25.0000 ug | PREFILLED_SYRINGE | INTRAMUSCULAR | Status: DC | PRN

## 2024-05-25 MED ORDER — GLYCOPYRROLATE 0.2 MG/ML IJ SOLN: INTRAMUSCULAR | Status: DC | PRN

## 2024-05-25 MED ORDER — PROPOFOL 1000 MG/100ML IV EMUL
INTRAVENOUS | Status: AC
  Filled 2024-05-25: qty 100

## 2024-05-25 MED ORDER — PROPOFOL 500 MG/50ML IV EMUL
INTRAVENOUS | Status: AC
  Filled 2024-05-25: qty 50

## 2024-05-25 MED ORDER — NEOSTIGMINE METHYLSULFATE 3 MG/3ML IV SOSY
PREFILLED_SYRINGE | INTRAVENOUS | Status: AC
  Filled 2024-05-25: qty 3

## 2024-05-25 MED FILL — Acetaminophen IV Soln 10 MG/ML: INTRAVENOUS | Qty: 100 | Status: AC

## 2024-05-25 SURGICAL SUPPLY — 17 items
BAG URINE DRAIN 2000ML AR STRL (UROLOGICAL SUPPLIES) ×1 IMPLANT
BAG URO CATCHER STRL LF (MISCELLANEOUS) ×1 IMPLANT
CATH URTH STD 24FR FL 3W 2 (CATHETERS) ×1 IMPLANT
CLOTH BEACON ORANGE TIMEOUT ST (SAFETY) ×1 IMPLANT
DRAPE FOOT SWITCH (DRAPES) ×1 IMPLANT
GLOVE BIO SURGEON STRL SZ7.5 (GLOVE) ×1 IMPLANT
GOWN STRL REUS W/ TWL XL LVL3 (GOWN DISPOSABLE) ×1 IMPLANT
HOLDER FOLEY CATH W/STRAP (MISCELLANEOUS) ×1 IMPLANT
KIT TURNOVER KIT A (KITS) ×1 IMPLANT
LOOP CUT BIPOLAR 24F LRG (ELECTROSURGICAL) ×1 IMPLANT
MANIFOLD NEPTUNE II (INSTRUMENTS) ×1 IMPLANT
PACK CYSTO (CUSTOM PROCEDURE TRAY) ×1 IMPLANT
PAD PREP 24X48 CUFFED NSTRL (MISCELLANEOUS) ×1 IMPLANT
SYR 30ML LL (SYRINGE) ×1 IMPLANT
SYRINGE TOOMEY IRRIG 70ML (MISCELLANEOUS) ×1 IMPLANT
TUBING CONNECTING 10 (TUBING) ×1 IMPLANT
TUBING UROLOGY SET (TUBING) ×1 IMPLANT

## 2024-05-25 NOTE — Op Note (Signed)
 Preoperative diagnosis: Bladder outlet obstruction secondary to BPH  Postoperative diagnosis:  Bladder outlet obstruction secondary to BPH  Procedure:  Cystoscopy Transurethral resection of the prostate  Surgeon: Calvin Escobar, III. M.D.  Anesthesia: General  Complications: None  EBL: Minimal  Specimens: Prostate chips  Indication: Calvin Escobar is a patient with bladder outlet obstruction secondary to benign prostatic hyperplasia. After reviewing the management options for treatment, he elected to proceed with the above surgical procedure(s). We have discussed the potential benefits and risks of the procedure, side effects of the proposed treatment, the likelihood of the patient achieving the goals of the procedure, and any potential problems that might occur during the procedure or recuperation. Informed consent has been obtained.  Description of procedure:  The patient was taken to the operating room and general anesthesia was induced.  The patient was placed in the dorsal lithotomy position, prepped and draped in the usual sterile fashion, and preoperative antibiotics were administered. A preoperative time-out was performed.   Cystourethroscopy was performed.  The patients urethra was examined and demonstrated bilobar prostatic hypertrophy with a median lobe.   The bladder was then systematically examined in its entirety. There was no evidence of any bladder tumors, stones, or other mucosal pathology.  The ureteral orifices were identified and marked so as to be avoided during the procedure.  The prostate adenoma was then resected utilizing loop cautery resection with the bipolar cutting loop.  The prostate adenoma from the bladder neck back to the verumontanum was resected beginning at the six o'clock position and then extended to include the right and left lobes of the prostate and anterior prostate. Care was taken not to resect distal to the verumontanum.  Hemostasis  was then achieved with the cautery and the bladder was emptied and reinspected with no significant bleeding noted at the end of the procedure.    A 24 French 3 way catheter was then placed into the bladder.  The patient appeared to tolerate the procedure well and without complications.  The patient was able to be awakened and transferred to the recovery unit in satisfactory condition.

## 2024-05-25 NOTE — Anesthesia Postprocedure Evaluation (Signed)
"   Anesthesia Post Note  Patient: Calvin Escobar  Procedure(s) Performed: TURP (TRANSURETHRAL RESECTION OF PROSTATE) (Prostate)     Patient location during evaluation: PACU Anesthesia Type: General Level of consciousness: awake and alert Pain management: pain level controlled Vital Signs Assessment: post-procedure vital signs reviewed and stable Respiratory status: spontaneous breathing, nonlabored ventilation, respiratory function stable and patient connected to nasal cannula oxygen Cardiovascular status: blood pressure returned to baseline and stable Postop Assessment: no apparent nausea or vomiting Anesthetic complications: no   No notable events documented.  Last Vitals:  Vitals:   05/25/24 1115 05/25/24 1130  BP: 131/64 113/70  Pulse: 62 (!) 52  Resp: 12 (!) 22  Temp:    SpO2: 97% 97%    Last Pain:  Vitals:   05/25/24 1130  TempSrc:   PainSc: 0-No pain                 Rome Ade      "

## 2024-05-25 NOTE — Transfer of Care (Signed)
 Immediate Anesthesia Transfer of Care Note  Patient: Calvin Escobar  Procedure(s) Performed: TURP (TRANSURETHRAL RESECTION OF PROSTATE) (Prostate)  Patient Location: PACU  Anesthesia Type:General  Level of Consciousness: drowsy, patient cooperative, and responds to stimulation  Airway & Oxygen Therapy: Patient Spontanous Breathing and Patient connected to face mask oxygen  Post-op Assessment: Report given to RN and Post -op Vital signs reviewed and stable  Post vital signs: Reviewed and stable  Last Vitals:  Vitals Value Taken Time  BP 131/60 05/25/24 10:06  Temp    Pulse 78 05/25/24 10:09  Resp 20 05/25/24 10:09  SpO2 100 % 05/25/24 10:09  Vitals shown include unfiled device data.  Last Pain:  Vitals:   05/25/24 0657  TempSrc: Oral  PainSc:          Complications: No notable events documented.

## 2024-05-25 NOTE — Anesthesia Procedure Notes (Addendum)
 Procedure Name: MAC Date/Time: 05/25/2024 8:06 AM  Performed by: Nada Corean CROME, CRNAPre-anesthesia Checklist: Emergency Drugs available, Patient identified, Suction available, Patient being monitored and Timeout performed Patient Re-evaluated:Patient Re-evaluated prior to induction Oxygen Delivery Method: Circle system utilized Preoxygenation: Pre-oxygenation with 100% oxygen Induction Type: IV induction Ventilation: Mask ventilation without difficulty LMA: LMA inserted LMA Size: 5.0 Number of attempts: 1 Placement Confirmation: positive ETCO2 Tube secured with: Tape Dental Injury: Teeth and Oropharynx as per pre-operative assessment  Comments: LMA not seating for effective Vt; MD called to OR for conversion to ETT.

## 2024-05-25 NOTE — Discharge Instructions (Signed)
 Transurethral Resection of the Prostate (TURP) or Greenlight laser ablation of the Prostate  Care After  Refer to this sheet in the next few weeks. These discharge instructions provide you with general information on caring for yourself after you leave the hospital. Your caregiver may also give you specific instructions. Your treatment has been planned according to the most current medical practices available, but unavoidable complications sometimes occur. If you have any problems or questions after discharge, please call your caregiver.  HOME CARE INSTRUCTIONS   Medications  You may receive medicine for pain management. As your level of discomfort decreases, adjustments in your pain medicines may be made.   Take all medicines as directed.   You may be given a medicine (antibiotic) to kill germs following surgery. Finish all medicines. Let your caregiver know if you have any side effects or problems from the medicine.   If you are on aspirin, it would be best not to restart the aspirin until the blood in the urine clears Hygiene  You can take a shower after surgery.   You should not take a bath while you still have the urethral catheter. Activity  You will be encouraged to get out of bed as much as possible and increase your activity level as tolerated.   Spend the first week in and around your home. For 3 weeks, avoid the following:   Straining.   Running.   Strenuous work.   Walks longer than a few blocks.   Riding for extended periods.   Sexual relations.   Do not lift heavy objects (more than 20 pounds) for at least 1 month. When lifting, use your arms instead of your abdominal muscles.   You will be encouraged to walk as tolerated. Do not exert yourself. Increase your activity level slowly. Remember that it is important to keep moving after an operation of any type. This cuts down on the possibility of developing blood clots.   Your caregiver will tell you when you  can resume driving and light housework. Discuss this at your first office visit after discharge. Diet  No special diet is ordered after a TURP. However, if you are on a special diet for another medical problem, it should be continued.   Normal fluid intake is usually recommended.   Avoid alcohol and caffeinated drinks for 2 weeks. They irritate the bladder. Decaffeinated drinks are okay.   Avoid spicy foods.  Bladder Function  For the first 10 days, empty the bladder whenever you feel a definite desire. Do not try to hold the urine for long periods of time.   Urinating once or twice a night even after you are healed is not uncommon.   You may see some recurrence of blood in the urine after discharge from the hospital. This usually happens within 2 weeks after the procedure.If this occurs, force fluids again as you did in the hospital and reduce your activity.  Bowel Function  You may experience some constipation after surgery. This can be minimized by increasing fluids and fiber in your diet. Drink enough water and fluids to keep your urine clear or pale yellow.   A stool softener may be prescribed for use at home. Do not strain to move your bowels.   If you are requiring increased pain medicine, it is important that you take stool softeners to prevent constipation. This will help to promote proper healing by reducing the need to strain to move your bowels.  Sexual Activity  Semen movement  in the opposite direction and into the bladder (retrograde ejaculation) may occur. Since the semen passes into the bladder, cloudy urine can occur the first time you urinate after intercourse. Or, you may not have an ejaculation during erection. Ask your caregiver when you can resume sexual activity. Retrograde ejaculation and reduced semen discharge should not reduce one's pleasure of intercourse.  Postoperative Visit  Arrange the date and time of your after surgery visit with your caregiver.  Return  to Work  After your recovery is complete, you will be able to return to work and resume all activities. Your caregiver will inform you when you can return to work.    Foley Catheter Care A soft, flexible tube (Foley catheter) may have been placed in your bladder to drain urine and fluid. Follow these instructions: Taking Care of the Catheter  Keep the area where the catheter leaves your body clean.   Attach the catheter to the leg so there is no tension on the catheter.   Keep the drainage bag below the level of the bladder, but keep it OFF the floor.   Do not take long soaking baths. Your caregiver will give instructions about showering.   Wash your hands before touching ANYTHING related to the catheter or bag.   Using mild soap and warm water on a washcloth:   Clean the area closest to the catheter insertion site using a circular motion around the catheter.   Clean the catheter itself by wiping AWAY from the insertion site for several inches down the tube.   NEVER wipe upward as this could sweep bacteria up into the urethra (tube in your body that normally drains the bladder) and cause infection.   Place a small amount of sterile lubricant at the tip of the penis where the catheter is entering.  Taking Care of the Drainage Bags  Two drainage bags may be taken home: a large overnight drainage bag, and a smaller leg bag which fits underneath clothing.   It is okay to wear the overnight bag at any time, but NEVER wear the smaller leg bag at night.   Keep the drainage bag well below the level of your bladder. This prevents backflow of urine into the bladder and allows the urine to drain freely.   Anchor the tubing to your leg to prevent pulling or tension on the catheter. Use tape or a leg strap provided by the hospital.   Empty the drainage bag when it is 1/2 to 3/4 full. Wash your hands before and after touching the bag.   Periodically check the tubing for kinks to make sure  there is no pressure on the tubing which could restrict the flow of urine.  Changing the Drainage Bags  Cleanse both ends of the clean bag with alcohol before changing.   Pinch off the rubber catheter to avoid urine spillage during the disconnection.   Disconnect the dirty bag and connect the clean one.   Empty the dirty bag carefully to avoid a urine spill.   Attach the new bag to the leg with tape or a leg strap.  Cleaning the Drainage Bags  Whenever a drainage bag is disconnected, it must be cleaned quickly so it is ready for the next use.   Wash the bag in warm, soapy water.   Rinse the bag thoroughly with warm water.   Soak the bag for 30 minutes in a solution of white vinegar and water (1 cup vinegar to 1 quart warm  water).   Rinse with warm water.  SEEK MEDICAL CARE IF:   You have chills or night sweats.   You are leaking around your catheter or have problems with your catheter. It is not uncommon to have sporadic leakage around your catheter as a result of bladder spasms. If the leakage stops, there is not much need for concern. If you are uncertain, call your caregiver.   You develop side effects that you think are coming from your medicines.  SEEK IMMEDIATE MEDICAL CARE IF:   You are suddenly unable to urinate. Check to see if there are any kinks in the drainage tubing that may cause this. If you cannot find any kinks, call your caregiver immediately. This is an emergency.   You develop shortness of breath or chest pains.   Bleeding persists or clots develop in your urine.   You have a fever.   You develop pain in your back or over your lower belly (abdomen).   You develop pain or swelling in your legs.   Any problems you are having get worse rather than better.  MAKE SURE YOU:   Understand these instructions.   Will watch your condition.   Will get help right away if you are not doing well or get worse.

## 2024-05-25 NOTE — Anesthesia Procedure Notes (Signed)
 Procedure Name: Intubation Date/Time: 05/25/2024 8:06 AM  Performed by: Nada Corean CROME, CRNAPre-anesthesia Checklist: Patient identified, Emergency Drugs available, Suction available, Patient being monitored and Timeout performed Patient Re-evaluated:Patient Re-evaluated prior to induction Oxygen Delivery Method: Circle system utilized Induction Type: Rapid sequence Laryngoscope Size: Mac and 4 Grade View: Grade I Tube type: Oral Tube size: 7.5 mm Number of attempts: 1 Airway Equipment and Method: Stylet Placement Confirmation: ETT inserted through vocal cords under direct vision, positive ETCO2 and breath sounds checked- equal and bilateral Secured at: 23 cm Tube secured with: Tape Dental Injury: Teeth and Oropharynx as per pre-operative assessment  Comments: Conversion to ETT for inadequate tidal volumes with #5 LMA

## 2024-05-25 NOTE — H&P (Signed)
 H&P  Chief Complaint: BPH  History of Present Illness: 73 YO M with BPH with urinary retention presents for TURP. Has cardiac comorbidities and is aware of risks of surgery and anesthesia.  Past Medical History:  Diagnosis Date   Acute cystitis 05/13/2023   Acute hyponatremia 05/13/2023   Acute kidney injury superimposed on chronic kidney disease 05/13/2023   CLINICAL CONTEXT: 73 year old male with stage 3 CKD, colovesical fistula, recurrent UTIs, and recent candidal UTI. Recent renal ultrasound (09/28/2023) confirmed mild left hydronephrosis and prostatic mass protruding into bladder base. Iron  studies consistent with anemia of chronic disease (iron  32, TIBC 238, ferritin 420).   DIAGNOSTIC ASSESSMENT: ? 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 History of GI bleeding (07/2023). Currently on ferrous sulfate  supplementation.   DIAGNOSTIC ASSESSMENT: ? Primary etiology (70-80% probability): Anemia of chronic disease/inflammation ? 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  EF 35 to 40% on echo 04/2021   Dementia (HCC)     Depression    Diarrhea 05/13/2023   E coli bacteremia 05/14/2023   High anion gap metabolic acidosis 07/13/2023   History of Clostridioides difficile colitis 08/15/2023   Associated with colovesical fistula? Recurrent antibiotic(s) requirement     Hyperkalemia    Associated with losartan   Advised patient low potassium diet 08/2022             Lab Results      Component    Value    Date/Time           K    4.0    12/27/2023 01:20 PM           K    4.4    12/18/2023 05:07 AM           K    4.4    12/17/2023 06:01 AM           K    4.7    12/16/2023 06:15 AM           K    4.9    12/15/2023 08:21 AM           K    4.4    12/14/2023 06:00 AM           K    5.0    Hyperlipidemia    Hypertension    Leukocytosis 12/11/2023   Lab Results      Component    Value    Date/Time           WBC    6.2    12/27/2023 01:20 PM           WBC    11.6 (H)  12/18/2023 05:07 AM           WBC    9.6    12/17/2023 06:01 AM           WBC    9.4    12/16/2023 06:15 AM           WBC    12.7 (H)    12/15/2023 08:21 AM           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  Work-up with cardiology in January 2021.     catheterization    Assessment:     1. Normal coronaries  2. NICM with EF 25-30%  3. Normal hemodynamics  .     Pneumonia    PUD (peptic ulcer disease) 01/12/2015   Formatting of this note might be different from the original.  Endoscopy esophageal stricture 2016     Septic shock from UTI 06/02/2023   Severe sepsis (HCC) 06/03/2023   SIRS (systemic inflammatory response syndrome) (HCC) 12/11/2023   SKIN CANCER, HX OF 05/10/2007   Facial left check and forehead and r shoulder  F/w derm   Syncope 05/13/2023   Thrombocytopenia 06/02/2023   Lab Results      Component    Value    Date           PLT    301    07/19/2023           PLT    324    07/18/2023           PLT    268    07/16/2023            PLT    207    07/14/2023           PLT    228    07/13/2023             Lab Results      Component    Value    Date           ESRSEDRATE    60 (H)    05/08/2023     No results found for: CRP          Lab Results      Component    Value       Urinary catheter complication 06/24/2023   Urinary dribbling 03/14/2022   Urinary incontinence 11/21/2022      Urinary Incontinence: They are wearing disposable diapers daily due to urinary incontinence, primarily nocturnal, and are currently on Tamsulosin  (Flomax ). We will refer them to Urology for further evaluation and potential treatment options and continue Tamsulosin  until they are seen by Urology.     Urinary tract infection without hematuria 06/02/2023   UTI (urinary tract infection) 07/01/2023   Past Surgical History:  Procedure Laterality Date   COLECTOMY, SIGMOID, ROBOT-ASSISTED N/A 01/09/2024   Procedure: ROBOTIC LOW ANTERIOR RESECTION WITH ANASTOMOSIS, DRAINAGE OF PELVIC ABSCESS, COLOVESICAL FISTULA TAKEDOWN AND REPAIR, INTRAOPERATIVE ASSESSMENT OF TISSUE PERFUSION USING ICG DYE, AND BILATERAL TAP BLOCK;  Surgeon: Sheldon Standing, MD;  Location: [facility];  Service: General;  Laterality: N/A;  ROBOTIC RESECTION OF COLON RECTOSIGMOID   COLONOSCOPY     COLONOSCOPY N/A 12/05/2023   Procedure: COLONOSCOPY;  Surgeon: Leigh Standing SQUIBB, MD;  Location: [facility] ENDOSCOPY;  Service: Gastroenterology;  Laterality: N/A;   CYSTOSCOPY WITH  INDOCYANINE GREEN  IMAGING (ICG) N/A 01/09/2024   Procedure: CYSTOSCOPY WITH INDOCYANINE GREEN  IMAGING (ICG);  Surgeon: Watt Rush, MD;  Location: [facility];  Service: Urology;  Laterality: N/A;  CYSTO WITH FIREFLY INJECTION   ESOPHAGOGASTRODUODENOSCOPY N/A 03/17/2024   Procedure: EGD (ESOPHAGOGASTRODUODENOSCOPY);  Surgeon: Nandigam, Kavitha V, MD;  Location: [facility] ENDOSCOPY;  Service: Gastroenterology;  Laterality: N/A;   FLEXIBLE SIGMOIDOSCOPY N/A 01/09/2024   Procedure: KINGSTON SIDE;  Surgeon: Sheldon Standing, MD;  Location:  [facility];  Service: General;  Laterality: N/A;   ILEOSTOMY CLOSURE N/A 03/12/2024   Procedure: CLOSURE, ILEOSTOMY;  Surgeon: Sheldon Standing, MD;  Location: [facility] OR;  Service: General;  Laterality: N/A;  GEN WITH ERAS PATHWAY   LAPAROSCOPIC LOOP COLOSTOMY N/A 01/09/2024   Procedure: CREATION OF DIVERTING LOOP ILEOSTOMY;  Surgeon: Sheldon Standing, MD;  Location: [facility];  Service: General;  Laterality: N/A;   RECTAL EXAM UNDER ANESTHESIA N/A 03/12/2024   Procedure: EXAM UNDER ANESTHESIA, RECTUM;  Surgeon: Sheldon Standing, MD;  Location: [facility] OR;  Service: General;  Laterality: N/A;   RIGHT/LEFT HEART CATH AND CORONARY ANGIOGRAPHY N/A 07/03/2019   Procedure: RIGHT/LEFT HEART CATH AND CORONARY ANGIOGRAPHY;  Surgeon: Cherrie Toribio SAUNDERS, MD;  Location: [facility];  Service: Cardiovascular;  Laterality: N/A;   ROBOTIC ASSISTED LAPAROSCOPIC LYSIS OF ADHESION  01/09/2024   Procedure: LYSIS, ADHESIONS, ROBOT-ASSISTED, LAPAROSCOPIC;  Surgeon: Sheldon Standing, MD;  Location: [facility];  Service: General;;    Home Medications:  Medications Prior to Admission  Medication Sig Dispense Refill Last Dose/Taking   ARIPiprazole  (ABILIFY ) 2 MG tablet Take 2 mg by mouth in the morning.   Past Week   Ascorbic Acid (VITAMIN C PO) Take 1 tablet by mouth daily.   Past Week   busPIRone  (BUSPAR ) 10 MG tablet Take 1 tablet (10 mg total) by mouth 3 (three) times daily. 270 tablet 3 05/24/2024   CALCIUM  PO Take 1 tablet by mouth in the morning and at bedtime.   Past Week   ferrous sulfate  325 (65 FE) MG tablet Take 1 tablet (325 mg total) by mouth 2 (two) times daily with a meal. 60 tablet 2 05/24/2024   finasteride  (PROSCAR ) 5 MG tablet Take 5 mg by mouth daily.   05/24/2024   folic acid  (FOLVITE ) 1 MG tablet TAKE 1 TABLET BY MOUTH EVERY DAY 90 tablet 4 Past Week   gabapentin  (NEURONTIN ) 100 MG capsule TAKE 1 CAPSULE (100 MG TOTAL) BY MOUTH THREE TIMES DAILY. 90 capsule 3 05/24/2024   loperamide  (IMODIUM  A-D) 2 MG tablet Take 6 mg by  mouth 4 (four) times daily.   Past Week   mirtazapine  (REMERON ) 30 MG tablet Take 30 mg by mouth at bedtime.   05/24/2024   primidone  (MYSOLINE ) 50 MG tablet Take 1 tablet (50 mg total) by mouth 2 (two) times daily.   05/24/2024   tamsulosin  (FLOMAX ) 0.4 MG CAPS capsule Take 0.4 mg by mouth in the morning and at bedtime.   05/24/2024   venlafaxine  XR (EFFEXOR -XR) 150 MG 24 hr capsule Take 150 mg by mouth daily.   05/24/2024   magic mouthwash SOLN Take 15 mLs by mouth 4 (four) times daily as needed for mouth pain (sore throat). Suspension contains equal amounts of Maalox Extra Strength, nystatin , and diphenhydramine . (Patient not taking: No sig reported)      Allergies: Allergies[1]  Family History  Problem Relation Age of Onset   Hypertension Mother    Cancer Father    Bone cancer Sister  Social History:  reports that he has never smoked. He has never used smokeless tobacco. He reports that he does not currently use alcohol. He reports that he does not use drugs.  ROS: A complete review of systems was performed.  All systems are negative except for pertinent findings as noted. ROS   Physical Exam:  Vital signs in last 24 hours: Temp:  [98 F (36.7 C)] 98 F (36.7 C) (12/22 0657) Pulse Rate:  [76] 76 (12/22 0657) Resp:  [16] 16 (12/22 0657) BP: (144)/(90) 144/90 (12/22 0657) SpO2:  [99 %] 99 % (12/22 0657) General:  Alert and oriented, No acute distress HEENT: Normocephalic, atraumatic Neck: No JVD or lymphadenopathy Cardiovascular: Regular rate and rhythm Lungs: Regular rate and effort Abdomen: Soft, nontender, nondistended, no abdominal masses Back: No CVA tenderness Extremities: No edema Neurologic: Grossly intact  Laboratory Data:  No results found for this or any previous visit (from the past 24 hours). No results found for this or any previous visit (from the past 240 hours). Creatinine: Recent Labs    05/22/24 1253  CREATININE 1.59*    Impression/Assessment:   BPH with urinary retention  Plan:  Proceed with TURP  Sherwood JONETTA Edison, III 05/25/2024, 7:30 AM      [1]  Allergies Allergen Reactions   Albuterol  Palpitations and Other (See Comments)    Supraventricular Tachycardia

## 2024-05-26 ENCOUNTER — Ambulatory Visit: Admitting: Internal Medicine

## 2024-05-26 ENCOUNTER — Ambulatory Visit: Payer: Self-pay

## 2024-05-26 ENCOUNTER — Encounter (HOSPITAL_COMMUNITY): Payer: Self-pay | Admitting: Urology

## 2024-05-26 LAB — SURGICAL PATHOLOGY

## 2024-05-26 NOTE — Telephone Encounter (Signed)
 Tried to call pt wife to let her know that pt needs to follow up with orthopedic surgery Lynwood Rias per Ed notes. If any questions or concerns to call office back. Left address and phone number for wife to call make appt

## 2024-05-26 NOTE — Telephone Encounter (Signed)
 TURP yesterday 05/25/24 Broken L arm, splinted in ED 05/22/24 - reporting swelling to L hand since yesterday   Attempted to make PCP appointment for patient since wife of pt reports not knowing who to follow up with, and reported not having an ortho consult. No appts available at Dr Morrison's office, wife of pt refused alternate location, stated I feel like I am getting the runaround. Wife of pt educated on checking cap refill of pt's fingers and to remove ace bandage if pt experiences n/t or if fingers become cool to the touch. Please reach out to check on pt.    FYI Only or Action Required?: Action required by provider: update on patient condition.  Patient was last seen in primary care on 04/22/2024 by [name], MD.  Called Nurse Triage reporting Hand Problem.  Symptoms began yesterday.  Interventions attempted: Nothing.  Symptoms are: gradually worsening.  Triage Disposition: See Physician Within 24 Hours  Patient/caregiver understands and will follow disposition?: No, refuses disposition  Copied from CRM [id number]. Topic: Clinical - Red Word Triage >> May 26, 2024 11:07 AM China J wrote: [facility] that prompted transfer to Nurse Triage: The patient's left hand is very swollen, He has an ace bandage on it and the wife thinks he needs to loosen it. Reason for Disposition  [1] Fingers or toes become swollen (compared to noninjured side) AND [2] not improved after elevation  Answer Assessment - Initial Assessment Questions 1. MECHANISM: How did the injury happen?     Pt fell on 12/19 and broke L arm, temporary splint placed on L arm in ED; wife of pt noticed L hand swelling yesterday when the pt was in the hospital undergoing a TURP   2. ONSET: When did the injury happen? (e.g., minutes, hours ago)      Initial injury on 12/19; noticed L hand swelling yesterday 12/22  3. APPEARANCE of INJURY: What does the injury look like?      Left hand from the knuckle up  swollen per wife of patient  4. SEVERITY: Can you use your hand normally? Can you bend your fingers into a ball and then fully open them?     Pt hand is splinted, N/A  8. OTHER SYMPTOMS: Do you have any other symptoms?      Denies  Patient states he is able to feel his fingers, denies n/t. Wife of patient checked capillary refill on patient and reported to RN the color comes back. Wife of patient noted to be a poor historian  Answer Assessment - Initial Assessment Questions 1. CAST LOCATION: Where is the cast? (e.g., left lower leg, right arm)     L arm  2. CAST SYMPTOM: What's the main symptom you're concerned about? (e.g., color change, numbness, pain)     Swelling to L hand from the knuckle up per wife  3. ONSET: When did swelling start?, Is it getting worse?     Yesterday  4. WHEN CASTED: When was the cast put on?      12/19  5. INJURY: What is the underlying injury?  (e.g., fracture, torn ligament, surgery)      Fracture of left radius  7. OTHER SYMPTOMS: Do you have any other symptoms? (e.g., difficulty breathing, fever)     Denies  Protocols used: Hand Injury-A-AH, Cast Symptoms and Questions-A-AH

## 2024-06-02 ENCOUNTER — Other Ambulatory Visit: Payer: Self-pay

## 2024-06-06 ENCOUNTER — Other Ambulatory Visit: Payer: Self-pay | Admitting: Internal Medicine

## 2024-06-06 DIAGNOSIS — R251 Tremor, unspecified: Secondary | ICD-10-CM

## 2024-06-09 ENCOUNTER — Observation Stay (HOSPITAL_COMMUNITY)
Admission: EM | Admit: 2024-06-09 | Discharge: 2024-06-10 | Disposition: A | Discharge status: Home / Self Care | Attending: Internal Medicine | Admitting: Internal Medicine

## 2024-06-09 ENCOUNTER — Other Ambulatory Visit: Payer: Self-pay

## 2024-06-09 ENCOUNTER — Telehealth: Payer: Self-pay | Admitting: Internal Medicine

## 2024-06-09 ENCOUNTER — Encounter (HOSPITAL_COMMUNITY): Payer: Self-pay | Admitting: Internal Medicine

## 2024-06-09 DIAGNOSIS — R319 Hematuria, unspecified: Secondary | ICD-10-CM | POA: Diagnosis present

## 2024-06-09 DIAGNOSIS — N179 Acute kidney failure, unspecified: Secondary | ICD-10-CM

## 2024-06-09 DIAGNOSIS — D72829 Elevated white blood cell count, unspecified: Secondary | ICD-10-CM | POA: Insufficient documentation

## 2024-06-09 DIAGNOSIS — Z85828 Personal history of other malignant neoplasm of skin: Secondary | ICD-10-CM | POA: Diagnosis not present

## 2024-06-09 DIAGNOSIS — I130 Hypertensive heart and chronic kidney disease with heart failure and stage 1 through stage 4 chronic kidney disease, or unspecified chronic kidney disease: Secondary | ICD-10-CM | POA: Diagnosis not present

## 2024-06-09 DIAGNOSIS — E785 Hyperlipidemia, unspecified: Secondary | ICD-10-CM | POA: Diagnosis not present

## 2024-06-09 DIAGNOSIS — N1831 Chronic kidney disease, stage 3a: Secondary | ICD-10-CM | POA: Diagnosis not present

## 2024-06-09 DIAGNOSIS — F418 Other specified anxiety disorders: Secondary | ICD-10-CM | POA: Insufficient documentation

## 2024-06-09 DIAGNOSIS — F0390 Unspecified dementia without behavioral disturbance: Secondary | ICD-10-CM | POA: Insufficient documentation

## 2024-06-09 DIAGNOSIS — E8721 Acute metabolic acidosis: Secondary | ICD-10-CM | POA: Diagnosis not present

## 2024-06-09 DIAGNOSIS — I5022 Chronic systolic (congestive) heart failure: Secondary | ICD-10-CM | POA: Diagnosis not present

## 2024-06-09 DIAGNOSIS — D631 Anemia in chronic kidney disease: Secondary | ICD-10-CM | POA: Insufficient documentation

## 2024-06-09 DIAGNOSIS — R310 Gross hematuria: Secondary | ICD-10-CM

## 2024-06-09 DIAGNOSIS — N401 Enlarged prostate with lower urinary tract symptoms: Secondary | ICD-10-CM | POA: Diagnosis not present

## 2024-06-09 DIAGNOSIS — G4733 Obstructive sleep apnea (adult) (pediatric): Secondary | ICD-10-CM | POA: Diagnosis not present

## 2024-06-09 DIAGNOSIS — D649 Anemia, unspecified: Secondary | ICD-10-CM

## 2024-06-09 HISTORY — DX: Gross hematuria: R31.0

## 2024-06-09 LAB — COMPREHENSIVE METABOLIC PANEL WITH GFR
ALT: 5 U/L (ref 0–44)
AST: 16 U/L (ref 15–41)
Albumin: 3.9 g/dL (ref 3.5–5.0)
Alkaline Phosphatase: 97 U/L (ref 38–126)
Anion gap: 11 (ref 5–15)
BUN: 40 mg/dL — ABNORMAL HIGH (ref 8–23)
CO2: 21 mmol/L — ABNORMAL LOW (ref 22–32)
Calcium: 9 mg/dL (ref 8.9–10.3)
Chloride: 104 mmol/L (ref 98–111)
Creatinine, Ser: 2 mg/dL — ABNORMAL HIGH (ref 0.61–1.24)
GFR, Estimated: 34 mL/min — ABNORMAL LOW
Glucose, Bld: 107 mg/dL — ABNORMAL HIGH (ref 70–99)
Potassium: 5.1 mmol/L (ref 3.5–5.1)
Sodium: 136 mmol/L (ref 135–145)
Total Bilirubin: 0.2 mg/dL (ref 0.0–1.2)
Total Protein: 7.3 g/dL (ref 6.5–8.1)

## 2024-06-09 LAB — CBC
HCT: 27.9 % — ABNORMAL LOW (ref 39.0–52.0)
Hemoglobin: 8.8 g/dL — ABNORMAL LOW (ref 13.0–17.0)
MCH: 29.2 pg (ref 26.0–34.0)
MCHC: 31.5 g/dL (ref 30.0–36.0)
MCV: 92.7 fL (ref 80.0–100.0)
Platelets: 233 K/uL (ref 150–400)
RBC: 3.01 MIL/uL — ABNORMAL LOW (ref 4.22–5.81)
RDW: 15.1 % (ref 11.5–15.5)
WBC: 12.9 K/uL — ABNORMAL HIGH (ref 4.0–10.5)
nRBC: 0 % (ref 0.0–0.2)

## 2024-06-09 LAB — TYPE AND SCREEN
ABO/RH(D): O POS
Antibody Screen: NEGATIVE

## 2024-06-09 LAB — PROTIME-INR
INR: 1.1 (ref 0.8–1.2)
Prothrombin Time: 14.4 s (ref 11.4–15.2)

## 2024-06-09 MED ORDER — CHLORHEXIDINE GLUCONATE CLOTH 2 % EX PADS
6.0000 | MEDICATED_PAD | Freq: Every day | CUTANEOUS | Status: DC
  Administered 2024-06-10: 6 via TOPICAL

## 2024-06-09 MED ORDER — LACTATED RINGERS IV BOLUS
1000.0000 mL | Freq: Once | INTRAVENOUS | Status: AC
  Administered 2024-06-09: 1000 mL via INTRAVENOUS

## 2024-06-09 MED ORDER — ALBUTEROL SULFATE (2.5 MG/3ML) 0.083% IN NEBU: 2.5000 mg | INHALATION_SOLUTION | RESPIRATORY_TRACT | Status: DC | PRN

## 2024-06-09 MED ORDER — ACETAMINOPHEN 650 MG RE SUPP: 650.0000 mg | Freq: Four times a day (QID) | RECTAL | Status: DC | PRN

## 2024-06-09 MED ORDER — ONDANSETRON HCL 4 MG PO TABS: 4.0000 mg | ORAL_TABLET | Freq: Four times a day (QID) | ORAL | Status: DC | PRN

## 2024-06-09 MED ORDER — ONDANSETRON HCL 4 MG/2ML IJ SOLN: 4.0000 mg | Freq: Four times a day (QID) | INTRAMUSCULAR | Status: DC | PRN

## 2024-06-09 MED ORDER — ACETAMINOPHEN 325 MG PO TABS: 650.0000 mg | ORAL_TABLET | Freq: Four times a day (QID) | ORAL | Status: DC | PRN

## 2024-06-09 MED ORDER — TRANEXAMIC ACID-NACL 1000-0.7 MG/100ML-% IV SOLN
1000.0000 mg | Freq: Once | INTRAVENOUS | Status: AC
  Administered 2024-06-09: 1000 mg via INTRAVENOUS
  Filled 2024-06-09: qty 100

## 2024-06-09 MED ORDER — TRAZODONE HCL 50 MG PO TABS: 25.0000 mg | ORAL_TABLET | Freq: Every evening | ORAL | Status: DC | PRN

## 2024-06-09 MED ORDER — SODIUM CHLORIDE 0.9 % IR SOLN
3000.0000 mL | Status: DC
  Administered 2024-06-09: 3000 mL

## 2024-06-09 NOTE — Progress Notes (Signed)
" °   06/09/24 2103  BiPAP/CPAP/SIPAP  BiPAP/CPAP/SIPAP Pt Type Adult  Reason BIPAP/CPAP not in use Non-compliant (Pt states that he does not wear CPAP at home and does not want to wear it tonight. RT advised to call if he changes his mind.)    "

## 2024-06-09 NOTE — ED Provider Notes (Signed)
 " [facility] EMERGENCY DEPARTMENT AT [facility] [place] Provider Note   CSN: [id number] Arrival date & time: 06/09/24  9080     Patient presents with: Hematuria   Calvin Escobar is a 74 y.o. male.   Pt w hx BPH, hx TURP 05/25/24 with c/o hematuria. States foley was removed last Monday, had been urinating on own, although with some difficulty, some weak stream, and ?incomplete emptying. Last evening onset gross hematuria.  This AM went to urology office, had foley placed and irrigation done and hematuria persisted, so sent to ED. Denies abd/flank pain. No fever or chills. No other abnormal bruising or bleeding. No anticoagulant use.   The history is provided by the patient and medical records. The history is limited by the condition of the patient.  Hematuria Pertinent negatives include no chest pain, no abdominal pain and no shortness of breath.       Prior to Admission medications  Medication Sig Start Date End Date Taking? Authorizing Provider  ARIPiprazole  (ABILIFY ) 2 MG tablet Take 2 mg by mouth in the morning.    [provider]  Ascorbic Acid (VITAMIN C PO) Take 1 tablet by mouth daily.    [provider]  busPIRone  (BUSPAR ) 10 MG tablet Take 1 tablet (10 mg total) by mouth 3 (three) times daily. 09/16/23   [name], MD  CALCIUM  PO Take 1 tablet by mouth in the morning and at bedtime.    [provider]  cefdinir  (OMNICEF ) 300 MG capsule Take 1 capsule (300 mg total) by mouth 2 (two) times daily. 05/25/24   Carolee Sherwood [name], MD  ferrous sulfate  325 (65 FE) MG tablet Take 1 tablet (325 mg total) by mouth 2 (two) times daily with a meal. 01/14/24   Sheldon Standing, MD  finasteride  (PROSCAR ) 5 MG tablet Take 5 mg by mouth daily. 11/06/23   [provider]  folic acid  (FOLVITE ) 1 MG tablet TAKE 1 TABLET BY MOUTH EVERY DAY 04/17/24   [name], MD  gabapentin  (NEURONTIN ) 100 MG capsule TAKE 1 CAPSULE (100 MG TOTAL) BY MOUTH THREE  TIMES DAILY. 05/09/24   [name], MD  loperamide  (IMODIUM  A-D) 2 MG tablet Take 6 mg by mouth 4 (four) times daily.    [provider]  magic mouthwash SOLN Take 15 mLs by mouth 4 (four) times daily as needed for mouth pain (sore throat). Suspension contains equal amounts of Maalox Extra Strength, nystatin , and diphenhydramine . Patient not taking: No sig reported 03/20/24   [name], DO  mirtazapine  (REMERON ) 30 MG tablet Take 30 mg by mouth at bedtime. 05/09/24   [provider]  primidone  (MYSOLINE ) 50 MG tablet TAKE 1 TABLET BY MOUTH THREE TIMES A DAY 06/06/24   [name], MD  tamsulosin  (FLOMAX ) 0.4 MG CAPS capsule Take 0.4 mg by mouth in the morning and at bedtime.    [provider]  venlafaxine  XR (EFFEXOR -XR) 150 MG 24 hr capsule Take 150 mg by mouth daily.    [provider]    Allergies: Albuterol     Review of Systems  Constitutional:  Negative for chills and fever.  HENT:  Negative for nosebleeds.   Respiratory:  Negative for cough and shortness of breath.   Cardiovascular:  Negative for chest pain.  Gastrointestinal:  Negative for abdominal pain, blood in stool, diarrhea and vomiting.  Genitourinary:  Positive for hematuria. Negative for dysuria and flank pain.  Musculoskeletal:  Negative for  back pain and neck pain.  Neurological:  Negative for syncope and light-headedness.  Hematological:  Does not bruise/bleed easily.    Updated Vital Signs BP 105/72 (BP Location: Left Arm)   Pulse (!) 105   Temp 98.5 F (36.9 C) (Oral)   Resp 18   SpO2 98%   Physical Exam Vitals and nursing note reviewed.  Constitutional:      Appearance: Normal appearance. He is well-developed.  HENT:     Head: Atraumatic.     Nose: Nose normal.     Mouth/Throat:     Mouth: Mucous membranes are moist.  Eyes:     General: No scleral icterus.    Conjunctiva/sclera: Conjunctivae normal.  Neck:     Trachea: No tracheal deviation.   Cardiovascular:     Rate and Rhythm: Regular rhythm. Tachycardia present.     Pulses: Normal pulses.     Heart sounds: Normal heart sounds. No murmur heard.    No friction rub. No gallop.  Pulmonary:     Effort: Pulmonary effort is normal. No accessory muscle usage or respiratory distress.     Breath sounds: Normal breath sounds.  Abdominal:     General: Bowel sounds are normal. There is no distension.     Palpations: Abdomen is soft. There is no mass.     Tenderness: There is no abdominal tenderness. There is no guarding.  Genitourinary:    Comments: No cva tenderness. Normal external gu exam. Foley in place, dark blood in tubing and bag.  Musculoskeletal:        General: No swelling.     Cervical back: Normal range of motion and neck supple. No rigidity.  Skin:    General: Skin is warm and dry.     Findings: No rash.  Neurological:     Mental Status: He is alert.     Comments: Alert, speech clear.   Psychiatric:        Mood and Affect: Mood normal.     (all labs ordered are listed, but only abnormal results are displayed) Results for orders placed or performed during the hospital encounter of 06/09/24  CBC   Collection Time: 06/09/24  9:47 AM  Result Value Ref Range   WBC 12.9 (H) 4.0 - 10.5 K/uL   RBC 3.01 (L) 4.22 - 5.81 MIL/uL   Hemoglobin 8.8 (L) 13.0 - 17.0 g/dL   HCT 72.0 (L) 60.9 - 47.9 %   MCV 92.7 80.0 - 100.0 fL   MCH 29.2 26.0 - 34.0 pg   MCHC 31.5 30.0 - 36.0 g/dL   RDW 84.8 88.4 - 84.4 %   Platelets 233 150 - 400 K/uL   nRBC 0.0 0.0 - 0.2 %  Comprehensive metabolic panel with GFR   Collection Time: 06/09/24  9:47 AM  Result Value Ref Range   Sodium 136 135 - 145 mmol/L   Potassium 5.1 3.5 - 5.1 mmol/L   Chloride 104 98 - 111 mmol/L   CO2 21 (L) 22 - 32 mmol/L   Glucose, Bld 107 (H) 70 - 99 mg/dL   BUN 40 (H) 8 - 23 mg/dL   Creatinine, Ser 7.99 (H) 0.61 - 1.24 mg/dL   Calcium  9.0 8.9 - 10.3 mg/dL   Total Protein 7.3 6.5 - 8.1 g/dL   Albumin  3.9  3.5 - 5.0 g/dL   AST 16 15 - 41 U/L   ALT 5 0 - 44 U/L   Alkaline Phosphatase 97 38 - 126 U/L  Total Bilirubin 0.2 0.0 - 1.2 mg/dL   GFR, Estimated 34 (L) >60 mL/min   Anion gap 11 5 - 15  Protime-INR   Collection Time: 06/09/24  9:47 AM  Result Value Ref Range   Prothrombin Time 14.4 11.4 - 15.2 seconds   INR 1.1 0.8 - 1.2  Type and screen   Collection Time: 06/09/24  9:47 AM  Result Value Ref Range   ABO/RH(D) O POS    Antibody Screen NEG    Sample Expiration      06/12/2024,2359 Performed at [facility], [address] [place], [place] [address]    DG Elbow Complete Left Result Date: 05/22/2024 EXAM: 3 VIEW(S) XRAY OF THE LEFT ELBOW COMPARISON: None available. CLINICAL HISTORY: fall FINDINGS: BONES AND JOINTS: Nondisplaced intra-articular radial head fracture. Small elbow effusion. No malalignment. SOFT TISSUES: The soft tissues are unremarkable. IMPRESSION: 1. Nondisplaced intra-articular radial head fracture. 2. Small elbow effusion. Electronically signed by: Greig Pique MD 05/22/2024 03:13 PM EST RP Workstation: [id number]   CT Cervical Spine Wo Contrast Result Date: 05/22/2024 EXAM: CT CERVICAL SPINE WITHOUT CONTRAST 05/22/2024 02:41:24 PM TECHNIQUE: CT of the cervical spine was performed without the administration of intravenous contrast. Multiplanar reformatted images are provided for review. Automated exposure control, iterative reconstruction, and/or weight based adjustment of the mA/kV was utilized to reduce the radiation dose to as low as reasonably achievable. COMPARISON: CT cervical spine 06/02/2023. CLINICAL HISTORY: Neck trauma (Age >= 65y). Multiple recent falls. FINDINGS: BONES AND ALIGNMENT: Mild chronic reversal of the normal cervical lordosis with unchanged slight anterolisthesis of C4 on C5. No acute fracture or traumatic malalignment. Unchanged nonspecific 9 mm lucent focus in the C3 left lamina. Interbody ankylosis at C4-C5 and C5-C6. Facet  ankylosis at C4-C5. DEGENERATIVE CHANGES: Advanced upper cervical facet arthrosis. Severe widespread neural foraminal stenosis due to uncovertebral and facet spurring. Suspected moderate spinal stenosis at C5-C6. SOFT TISSUES: No prevertebral soft tissue swelling. IMPRESSION: 1. No acute cervical spine fracture or traumatic malalignment. Electronically signed by: Dasie Hamburg MD 05/22/2024 03:02 PM EST RP Workstation: [id number]   CT Head Wo Contrast Result Date: 05/22/2024 EXAM: CT HEAD WITHOUT CONTRAST 05/22/2024 02:41:24 PM TECHNIQUE: CT of the head was performed without the administration of intravenous contrast. Automated exposure control, iterative reconstruction, and/or weight based adjustment of the mA/kV was utilized to reduce the radiation dose to as low as reasonably achievable. COMPARISON: head CT 07/17/2023 CLINICAL HISTORY: Head trauma, minor. Multiple recent falls. FINDINGS: BRAIN AND VENTRICLES: There is no evidence of an acute infarct, intracranial hemorrhage, mass, midline shift, hydrocephalus, or extra-axial fluid collection. Generalized cerebral atrophy is mild for age. ORBITS: Bilateral cataract extraction. SINUSES: No acute abnormality. SOFT TISSUES AND SKULL: No acute soft tissue abnormality. No skull fracture. IMPRESSION: 1. No acute intracranial abnormality. Electronically signed by: Dasie Hamburg MD 05/22/2024 02:52 PM EST RP Workstation: [id number]     EKG: None  Radiology: No results found.   Procedures   Medications Ordered in the ED  lactated ringers  bolus 1,000 mL (1,000 mLs Intravenous New Bag/Given 06/09/24 1058)                                    Medical Decision Making Problems Addressed: AKI (acute kidney injury): acute illness or injury with systemic symptoms that poses a threat to life or bodily functions Chronic anemia: chronic illness or injury with exacerbation, progression, or side effects of treatment  that poses a threat to life or bodily  functions Gross hematuria: acute illness or injury with systemic symptoms that poses a threat to life or bodily functions Stage 3a chronic kidney disease (HCC): chronic illness or injury with exacerbation, progression, or side effects of treatment that poses a threat to life or bodily functions Symptomatic anemia: acute illness or injury with systemic symptoms  Amount and/or Complexity of Data Reviewed Independent Historian:     Details: Urology, hx External Data Reviewed: notes. Labs: ordered. Decision-making details documented in ED Course. Discussion of management or test interpretation with external provider(s): Urology, medicine  Risk Decision regarding hospitalization.   Iv ns. Continuous pulse ox and cardiac monitoring. Labs ordered/sent.   Differential diagnosis includes  . Dispo decision including potential need for admission considered - will get labs and reassess.   Reviewed nursing notes and prior charts for additional history. External reports reviewed. Additional history from: urology.   Cardiac monitor: sinus rhythm, rate 102.  LR bolus. Irrigate foley. Urology consulted.   Labs reviewed/interpreted by me - wbc 12, hgb 8.8, cr 2 0 - mildly aki on ckd. Ua pending.   Urology consulted - evaluated in ED, irrigating bladder continuously, is clearing from initial. Urology requests medicine admission. Hospitalists consulted for admission.        Final diagnoses:  Gross hematuria  Symptomatic anemia  Chronic anemia  AKI (acute kidney injury)  Stage 3a chronic kidney disease [facility])    ED Discharge Orders     None          Bernard Drivers, MD 06/09/24 1243  "

## 2024-06-09 NOTE — Telephone Encounter (Signed)
Placed on provider desk to sign

## 2024-06-09 NOTE — H&P (Signed)
 " History and Physical  Calvin Escobar FMW:[id number] DOB: May 26, 1951 DOA: 06/09/2024  PCP: [name], MD   Chief Complaint: Gross hematuria, pelvic pressure  HPI: Calvin Escobar is a 74 y.o. male with medical history significant for heart failure with reduced EF, recurrent UTIs, CKD stage IIIa and recent TURP for BPH at the end of December being admitted to the hospital with gross hematuria.  Patient states his surgical course was uncomplicated, his Foley catheter was removed 12/29, but last night he started having lots of lower pelvic discomfort, and inability to urinate.  He is not sure if he was able to urinate, but at some point he leaked very bloody looking urine onto his bed, he went to urology clinic this morning for evaluation.  There, he had Foley catheter placed and irrigation done but had continued bleeding.  Was sent to the ER for further evaluation, placed on continuous bladder irrigation and urine has started to clear up, but is still bloody.  His discomfort is completely resolved.  He denies any recent fevers, chills, nausea, vomiting, abdominal pain, chest pain, dizziness or lightheadedness.  He does not take any blood thinners.  Patient was evaluated by the urology team in the emergency department, and hospitalist admission was requested.  Review of Systems: Please see HPI for pertinent positives and negatives. A complete 10 system review of systems are otherwise negative.  Past Medical History:  Diagnosis Date   Acute cystitis 05/13/2023   Acute hyponatremia 05/13/2023   Acute kidney injury superimposed on chronic kidney disease 05/13/2023   CLINICAL CONTEXT: 74 year old male with stage 3 CKD, colovesical fistula, recurrent UTIs, and recent candidal UTI. Recent renal ultrasound (09/28/2023) confirmed mild left hydronephrosis and prostatic mass protruding into bladder base. Iron  studies consistent with anemia of chronic disease (iron  32, TIBC 238, ferritin 420).    DIAGNOSTIC ASSESSMENT: ? 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 History of GI bleeding (07/2023). Currently on ferrous sulfate  supplementation.   DIAGNOSTIC ASSESSMENT: ? Primary etiology (70-80% probability): Anemia of chronic disease/inflammation ? 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  EF 35 to 40% on echo 04/2021   Dementia (HCC)    Depression    Diarrhea 05/13/2023   E coli bacteremia 05/14/2023   High anion gap metabolic acidosis 07/13/2023   History of Clostridioides difficile colitis 08/15/2023   Associated with colovesical fistula? Recurrent antibiotic(s) requirement     Hyperkalemia    Associated with losartan   Advised patient low potassium diet 08/2022             Lab Results      Component    Value    Date/Time  K    4.0    12/27/2023 01:20 PM           K    4.4    12/18/2023 05:07 AM           K    4.4    12/17/2023 06:01 AM           K    4.7    12/16/2023 06:15 AM           K    4.9    12/15/2023 08:21 AM           K    4.4    12/14/2023 06:00 AM           K    5.0    Hyperlipidemia    Hypertension     Leukocytosis 12/11/2023   Lab Results      Component    Value    Date/Time           WBC    6.2    12/27/2023 01:20 PM           WBC    11.6 (H)    12/18/2023 05:07 AM           WBC    9.6    12/17/2023 06:01 AM           WBC    9.4    12/16/2023 06:15 AM           WBC    12.7 (H)    12/15/2023 08:21 AM           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  Work-up with cardiology in January 2021.     catheterization    Assessment:     1. Normal coronaries  2. NICM with EF 25-30%  3. Normal hemodynamics  .     Pneumonia    PUD (peptic ulcer disease) 01/12/2015   Formatting of this note might be different from the original.  Endoscopy esophageal stricture 2016     Septic shock from UTI 06/02/2023   Severe sepsis (HCC) 06/03/2023   SIRS (systemic inflammatory response syndrome) (HCC) 12/11/2023   SKIN CANCER, HX OF 05/10/2007   Facial left check and forehead and r shoulder  F/w derm   Syncope 05/13/2023   Thrombocytopenia 06/02/2023   Lab Results      Component    Value    Date           PLT    301    07/19/2023           PLT    324    07/18/2023           PLT    268    07/16/2023           PLT    207    07/14/2023           PLT    228    07/13/2023             Lab Results      Component    Value    Date           ESRSEDRATE    60 (H)    05/08/2023     No results found for: CRP  Lab Results      Component    Value       Urinary catheter complication 06/24/2023   Urinary dribbling 03/14/2022   Urinary incontinence 11/21/2022      Urinary Incontinence: They are wearing disposable diapers daily due to urinary incontinence, primarily nocturnal, and are currently on Tamsulosin  (Flomax ). We will refer them to Urology for further evaluation and potential treatment options and continue Tamsulosin  until they are seen by Urology.     Urinary tract infection without hematuria  06/02/2023   UTI (urinary tract infection) 07/01/2023   Past Surgical History:  Procedure Laterality Date   COLECTOMY, SIGMOID, ROBOT-ASSISTED N/A 01/09/2024   Procedure: ROBOTIC LOW ANTERIOR RESECTION WITH ANASTOMOSIS, DRAINAGE OF PELVIC ABSCESS, COLOVESICAL FISTULA TAKEDOWN AND REPAIR, INTRAOPERATIVE ASSESSMENT OF TISSUE PERFUSION USING ICG DYE, AND BILATERAL TAP BLOCK;  Surgeon: Sheldon Standing, MD;  Location: [facility];  Service: General;  Laterality: N/A;  ROBOTIC RESECTION OF COLON RECTOSIGMOID   COLONOSCOPY     COLONOSCOPY N/A 12/05/2023   Procedure: COLONOSCOPY;  Surgeon: Leigh Standing SQUIBB, MD;  Location[facility] ENDOSCOPY;  Service: Gastroenterology;  Laterality: N/A;   CYSTOSCOPY WITH INDOCYANINE GREEN  IMAGING (ICG) N/A 01/09/2024   Procedure: CYSTOSCOPY WITH INDOCYANINE GREEN  IMAGING (ICG);  Surgeon: Watt Rush, MD;  Location: [facility];  Service: Urology;  Laterality: N/A;  CYSTO WITH FIREFLY INJECTION   ESOPHAGOGASTRODUODENOSCOPY N/A 03/17/2024   Procedure: EGD (ESOPHAGOGASTRODUODENOSCOPY);  Surgeon: Nandigam, Kavitha V, MD;  Location: [facility] ENDOSCOPY;  Service: Gastroenterology;  Laterality: N/A;   FLEXIBLE SIGMOIDOSCOPY N/A 01/09/2024   Procedure: KINGSTON SIDE;  Surgeon: Sheldon Standing, MD;  Location: [facility];  Service: General;  Laterality: N/A;   ILEOSTOMY CLOSURE N/A 03/12/2024   Procedure: CLOSURE, ILEOSTOMY;  Surgeon: Sheldon Standing, MD;  Location: [facility] OR;  Service: General;  Laterality: N/A;  GEN WITH ERAS PATHWAY   LAPAROSCOPIC LOOP COLOSTOMY N/A 01/09/2024   Procedure: CREATION OF DIVERTING LOOP ILEOSTOMY;  Surgeon: Sheldon Standing, MD;  Location: [facility];  Service: General;  Laterality: N/A;   RECTAL EXAM UNDER ANESTHESIA N/A 03/12/2024   Procedure: EXAM UNDER ANESTHESIA, RECTUM;  Surgeon: Sheldon Standing, MD;  Location: [facility] OR;  Service: General;  Laterality: N/A;   RIGHT/LEFT HEART CATH AND CORONARY ANGIOGRAPHY N/A 07/03/2019   Procedure: RIGHT/LEFT HEART CATH AND CORONARY ANGIOGRAPHY;   Surgeon: Cherrie Toribio SAUNDERS, MD;  Location: [facility] INVASIVE CV LAB;  Service: Cardiovascular;  Laterality: N/A;   ROBOTIC ASSISTED LAPAROSCOPIC LYSIS OF ADHESION  01/09/2024   Procedure: LYSIS, ADHESIONS, ROBOT-ASSISTED, LAPAROSCOPIC;  Surgeon: Sheldon Standing, MD;  Location: [facility];  Service: General;;   TRANSURETHRAL RESECTION OF PROSTATE N/A 05/25/2024   Procedure: TURP (TRANSURETHRAL RESECTION OF PROSTATE);  Surgeon: Carolee Sherwood [name], MD;  Location: [facility];  Service: Urology;  Laterality: N/A;   Social History:  reports that he has never smoked. He has never used smokeless tobacco. He reports that he does not currently use alcohol. He reports that he does not use drugs.  Allergies[1]  Family History  Problem Relation Age of Onset   Hypertension Mother    Cancer Father    Bone cancer Sister      Prior to Admission medications  Medication Sig Start Date End Date Taking? Authorizing Provider  ARIPiprazole  (ABILIFY ) 2 MG tablet Take 2 mg by mouth in the morning.    [provider]  Ascorbic Acid (VITAMIN C PO) Take 1 tablet by mouth daily.    [provider]  busPIRone  (BUSPAR ) 10 MG tablet Take  1 tablet (10 mg total) by mouth 3 (three) times daily. 09/16/23   [name], MD  CALCIUM  PO Take 1 tablet by mouth in the morning and at bedtime.    [provider]  cefdinir  (OMNICEF ) 300 MG capsule Take 1 capsule (300 mg total) by mouth 2 (two) times daily. 05/25/24   Carolee Sherwood [name], MD  ferrous sulfate  325 (65 FE) MG tablet Take 1 tablet (325 mg total) by mouth 2 (two) times daily with a meal. 01/14/24   Sheldon Standing, MD  finasteride  (PROSCAR ) 5 MG tablet Take 5 mg by mouth daily. 11/06/23   [provider]  folic acid  (FOLVITE ) 1 MG tablet TAKE 1 TABLET BY MOUTH EVERY DAY 04/17/24   [name], MD  gabapentin  (NEURONTIN ) 100 MG capsule TAKE 1 CAPSULE (100 MG TOTAL) BY MOUTH THREE TIMES DAILY. 05/09/24   [name], MD  loperamide  (IMODIUM  A-D)  2 MG tablet Take 6 mg by mouth 4 (four) times daily.    [provider]  magic mouthwash SOLN Take 15 mLs by mouth 4 (four) times daily as needed for mouth pain (sore throat). Suspension contains equal amounts of Maalox Extra Strength, nystatin , and diphenhydramine . Patient not taking: No sig reported 03/20/24   Juvenal Raisin U, DO  mirtazapine  (REMERON ) 30 MG tablet Take 30 mg by mouth at bedtime. 05/09/24   [provider]  primidone  (MYSOLINE ) 50 MG tablet TAKE 1 TABLET BY MOUTH THREE TIMES A DAY 06/06/24   [name], MD  tamsulosin  (FLOMAX ) 0.4 MG CAPS capsule Take 0.4 mg by mouth in the morning and at bedtime.    [provider]  venlafaxine  XR (EFFEXOR -XR) 150 MG 24 hr capsule Take 150 mg by mouth daily.    [provider]    Physical Exam: BP 105/72 (BP Location: Left Arm)   Pulse (!) 105   Temp 98.4 F (36.9 C) (Oral)   Resp 18   SpO2 98%  General:  Alert, oriented, calm, in no acute distress, resting comfortably on room air, looks a little pale Cardiovascular: RRR, no murmurs or rubs, no peripheral edema  Respiratory: clear to auscultation bilaterally, no wheezes, no crackles  Abdomen: soft, nontender, nondistended, normal bowel tones heard  GU: Foley catheter in place with dark pink urine, no clots or sediment present Skin: dry, no rashes  Musculoskeletal: no joint effusions, normal range of motion  Psychiatric: appropriate affect, normal speech  Neurologic: extraocular muscles intact, clear speech, moving all extremities with intact sensorium         Labs on Admission:  Basic Metabolic Panel: Recent Labs  Lab 06/09/24 0947  NA 136  K 5.1  CL 104  CO2 21*  GLUCOSE 107*  BUN 40*  CREATININE 2.00*  CALCIUM  9.0   Liver Function Tests: Recent Labs  Lab 06/09/24 0947  AST 16  ALT 5  ALKPHOS 97  BILITOT 0.2  PROT 7.3  ALBUMIN  3.9   No results for input(s): LIPASE, AMYLASE in the last 168 hours. No results for  input(s): AMMONIA in the last 168 hours. CBC: Recent Labs  Lab 06/09/24 0947  WBC 12.9*  HGB 8.8*  HCT 27.9*  MCV 92.7  PLT 233   Cardiac Enzymes: No results for input(s): CKTOTAL, CKMB, CKMBINDEX, TROPONINI in the last 168 hours. BNP (last 3 results) Recent Labs    12/16/23 0615 12/17/23 0601 03/07/24 0551  BNP 868.0* 742.1* 573.1*    ProBNP (last 3 results) No  results for input(s): PROBNP in the last 8760 hours.  CBG: No results for input(s): GLUCAP in the last 168 hours.  Radiological Exams on Admission: No results found. Assessment/Plan Calvin Escobar is a 74 y.o. male with medical history significant for heart failure with reduced EF, recurrent UTIs, CKD stage IIIa and recent TURP for BPH at the end of December being admitted to the hospital with gross hematuria.  Gross hematuria-etiology is unclear at the moment, though he had recent TURP.  He presented to the urology office this morning with symptoms of urinary retention, this is now resolved and he is comfortable. -Observation admission -Avoid blood thinners -Continue continuous bladder irrigation -Urology management appreciated  Anemia of chronic disease-hemoglobin appears to be essentially at baseline, will monitor with daily labs in the hospital  Chronic heart failure with reduced EF-patient appears euvolemic and no evidence of heart failure exacerbation.  Will continue home medications once reconciled  CKD stage IIIa-renal function appears to be at baseline  OSA-CPAP at night  DVT prophylaxis: SCDs only    Code Status: Full Code  Consults called: Urology  Admission status: Observation  Time spent: 25 minutes  Calvin Escobar CHRISTELLA Gail MD [facility] Pager [phone]  If 7PM-7AM, please contact night-coverage [email or web address] Password [email or web address]  06/09/2024, 1:31 PM      [1]  Allergies Allergen Reactions   Albuterol  Palpitations and Other (See Comments)    Supraventricular  Tachycardia    "

## 2024-06-09 NOTE — Telephone Encounter (Signed)
 [facility] faxed Home Health Certificate (Order [place] [id number]), to be filled out by provider. [facility] requested to send it back via Fax within ASAP. Document is located in providers tray at front office.Please advise at [phone].

## 2024-06-09 NOTE — ED Notes (Signed)
 Patient dressed out in gown, pants/shoes/wallet at bedside.

## 2024-06-09 NOTE — ED Triage Notes (Signed)
 Foley cath placed 9 days ago. Foley started draining bloody urine with bright red blood last night around 0100. Some pain last night, none now. Sent here for irrigation.

## 2024-06-09 NOTE — Consult Note (Signed)
 "  Urology Consult Note   Requesting Attending Physician:  Zella Katha HERO, MD Service Providing Consult: Urology  Consulting Attending: Dr. Roseann   Reason for Consult: Gross hematuria  HPI: Calvin Escobar is seen in consultation for reasons noted above at the request of Zella, Mir HERO, MD. Patient is a 74 y.o. male presenting as directed from urology office.  Patient is followed by Dr. Carolee.  PMH significant for urinary CKD stage III, anemia of chronic disease, HTN, recurrent UTIs, chronic HFrEF, bipolar disorder, dementia, hyperlipidemia, OSA on CPAP, morbid obesity, retention, BPH with significant median lobe, pendulous urethral stricture, and Hx of colovesical fistula.  He has been on tamsulosin  and finasteride  since 11/2023.  Colovesical fistula takedown and repair with diverting ileostomy and drainage of pelvic abscess was completed on 8/25. He underwent TURP with Dr. Carolee in 05/2224 and passed voiding trial successfully approximately 1 week ago and experience recurrence of gross hematuria last night, prompting return to clinic.  Around 3L of sterile water  was hand irrigated in the clinic without the ability to control bleeding.  Patient was directed to the emergency department for inpatient evaluation and initiation of CBI.  On my arrival patient was alert, oriented, and in no distress.  His catheter was not draining.  I hand irrigated around 100 mL of clot material and initiated CBI. ------------------  Assessment:   74 y.o. male with gross hematuria   Recommendations: # S/p TURP # Gross hematuria # CKD III  Trend Hgb.  8.8 today, not far from his baseline.  He is chronically anemic.  Transfuse for Hgb < 7.0 Ongoing CBI, titrate to light pink.  Hand irrigate as needed Continue finasteride  and tamsulosin  1g TXA Urology will follow  Case and plan discussed with Dr. Roseann  Past Medical History: Past Medical History:  Diagnosis Date   Acute cystitis 05/13/2023    Acute hyponatremia 05/13/2023   Acute kidney injury superimposed on chronic kidney disease 05/13/2023   CLINICAL CONTEXT: 74 year old male with stage 3 CKD, colovesical fistula, recurrent UTIs, and recent candidal UTI. Recent renal ultrasound (09/28/2023) confirmed mild left hydronephrosis and prostatic mass protruding into bladder base. Iron  studies consistent with anemia of chronic disease (iron  32, TIBC 238, ferritin 420).   DIAGNOSTIC ASSESSMENT: ? 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 History of GI bleeding (07/2023). Currently on ferrous sulfate  supplementation.   DIAGNOSTIC ASSESSMENT: ? Primary etiology (70-80% probability): Anemia of chronic disease/inflammation ? 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  EF 35 to 40% on echo 04/2021   Dementia (HCC)    Depression    Diarrhea 05/13/2023   E coli bacteremia 05/14/2023   High anion gap metabolic  acidosis 07/13/2023   History of Clostridioides difficile colitis 08/15/2023   Associated with colovesical fistula? Recurrent antibiotic(s) requirement     Hyperkalemia     Associated with losartan   Advised patient low potassium diet 08/2022             Lab Results      Component    Value    Date/Time           K    4.0    12/27/2023 01:20 PM           K    4.4    12/18/2023 05:07 AM           K    4.4    12/17/2023 06:01 AM           K    4.7    12/16/2023 06:15 AM           K    4.9    12/15/2023 08:21 AM           K    4.4    12/14/2023 06:00 AM           K    5.0    Hyperlipidemia    Hypertension    Leukocytosis 12/11/2023   Lab Results      Component    Value    Date/Time           WBC    6.2    12/27/2023 01:20 PM           WBC    11.6 (H)    12/18/2023 05:07 AM           WBC    9.6    12/17/2023 06:01 AM           WBC    9.4    12/16/2023 06:15 AM           WBC    12.7 (H)    12/15/2023 08:21 AM           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  Work-up with cardiology in January 2021.     catheterization    Assessment:     1. Normal coronaries  2. NICM with EF 25-30%  3. Normal hemodynamics  .     Pneumonia    PUD (peptic ulcer disease) 01/12/2015   Formatting of this note might be different from the original.  Endoscopy esophageal stricture 2016     Septic shock from UTI 06/02/2023   Severe sepsis (HCC) 06/03/2023   SIRS (systemic inflammatory response syndrome) (HCC) 12/11/2023   SKIN CANCER, HX OF 05/10/2007   Facial left check and forehead and r shoulder  F/w derm   Syncope 05/13/2023   Thrombocytopenia 06/02/2023   Lab Results      Component    Value    Date           PLT    301    07/19/2023           PLT    324    07/18/2023           PLT    268    07/16/2023           PLT    207    07/14/2023  PLT    228    07/13/2023             Lab Results      Component    Value    Date           ESRSEDRATE    60 (H)    05/08/2023     No results found for: CRP          Lab Results      Component    Value       Urinary catheter  complication 06/24/2023   Urinary dribbling 03/14/2022   Urinary incontinence 11/21/2022      Urinary Incontinence: They are wearing disposable diapers daily due to urinary incontinence, primarily nocturnal, and are currently on Tamsulosin  (Flomax ). We will refer them to Urology for further evaluation and potential treatment options and continue Tamsulosin  until they are seen by Urology.     Urinary tract infection without hematuria 06/02/2023   UTI (urinary tract infection) 07/01/2023    Past Surgical History:  Past Surgical History:  Procedure Laterality Date   COLECTOMY, SIGMOID, ROBOT-ASSISTED N/A 01/09/2024   Procedure: ROBOTIC LOW ANTERIOR RESECTION WITH ANASTOMOSIS, DRAINAGE OF PELVIC ABSCESS, COLOVESICAL FISTULA TAKEDOWN AND REPAIR, INTRAOPERATIVE ASSESSMENT OF TISSUE PERFUSION USING ICG DYE, AND BILATERAL TAP BLOCK;  Surgeon: Sheldon Standing, MD;  Location: [facility];  Service: General;  Laterality: N/A;  ROBOTIC RESECTION OF COLON RECTOSIGMOID   COLONOSCOPY     COLONOSCOPY N/A 12/05/2023   Procedure: COLONOSCOPY;  Surgeon: Leigh Standing SQUIBB, MD;  Location: [facility] ENDOSCOPY;  Service: Gastroenterology;  Laterality: N/A;   CYSTOSCOPY WITH INDOCYANINE GREEN  IMAGING (ICG) N/A 01/09/2024   Procedure: CYSTOSCOPY WITH INDOCYANINE GREEN  IMAGING (ICG);  Surgeon: Watt Rush, MD;  Location: [facility];  Service: Urology;  Laterality: N/A;  CYSTO WITH FIREFLY INJECTION   ESOPHAGOGASTRODUODENOSCOPY N/A 03/17/2024   Procedure: EGD (ESOPHAGOGASTRODUODENOSCOPY);  Surgeon: Nandigam, Kavitha V, MD;  Location: [facility] ENDOSCOPY;  Service: Gastroenterology;  Laterality: N/A;   FLEXIBLE SIGMOIDOSCOPY N/A 01/09/2024   Procedure: KINGSTON SIDE;  Surgeon: Sheldon Standing, MD;  Location: [facility];  Service: General;  Laterality: N/A;   ILEOSTOMY CLOSURE N/A 03/12/2024   Procedure: CLOSURE, ILEOSTOMY;  Surgeon: Sheldon Standing, MD;  Location: [facility];  Service: General;  Laterality: N/A;  GEN WITH ERAS PATHWAY   LAPAROSCOPIC  LOOP COLOSTOMY N/A 01/09/2024   Procedure: CREATION OF DIVERTING LOOP ILEOSTOMY;  Surgeon: Sheldon Standing, MD;  Location: [facility];  Service: General;  Laterality: N/A;   RECTAL EXAM UNDER ANESTHESIA N/A 03/12/2024   Procedure: EXAM UNDER ANESTHESIA, RECTUM;  Surgeon: Sheldon Standing, MD;  Location: [facility] OR;  Service: General;  Laterality: N/A;   RIGHT/LEFT HEART CATH AND CORONARY ANGIOGRAPHY N/A 07/03/2019   Procedure: RIGHT/LEFT HEART CATH AND CORONARY ANGIOGRAPHY;  Surgeon: Cherrie Toribio SAUNDERS, MD;  Location: [facility] INVASIVE CV [facility];  Service: Cardiovascular;  Laterality: N/A;   ROBOTIC ASSISTED LAPAROSCOPIC LYSIS OF ADHESION  01/09/2024   Procedure: LYSIS, ADHESIONS, ROBOT-ASSISTED, LAPAROSCOPIC;  Surgeon: Sheldon Standing, MD;  Location: [facility];  Service: General;;   TRANSURETHRAL RESECTION OF PROSTATE N/A 05/25/2024   Procedure: TURP (TRANSURETHRAL RESECTION OF PROSTATE);  Surgeon: Carolee Sherwood [name], MD;  Location: [facility];  Service: Urology;  Laterality: N/A;    Medication: Current Facility-Administered Medications  Medication Dose Route Frequency Provider Last Rate Last Admin   acetaminophen  (TYLENOL ) tablet 650 mg  650 mg Oral Q6H PRN Zella Katha HERO, MD       Or   acetaminophen  (  TYLENOL ) suppository 650 mg  650 mg Rectal Q6H PRN Zella, Mir M, MD       ondansetron  (ZOFRAN ) tablet 4 mg  4 mg Oral Q6H PRN Zella, Mir M, MD       Or   ondansetron  (ZOFRAN ) injection 4 mg  4 mg Intravenous Q6H PRN Zella, Mir M, MD       traZODone  (DESYREL ) tablet 25 mg  25 mg Oral QHS PRN Zella, Mir M, MD       Current Outpatient Medications  Medication Sig Dispense Refill   ARIPiprazole  (ABILIFY ) 2 MG tablet Take 2 mg by mouth in the morning.     Ascorbic Acid (VITAMIN C PO) Take 1 tablet by mouth daily.     busPIRone  (BUSPAR ) 10 MG tablet Take 1 tablet (10 mg total) by mouth 3 (three) times daily. 270 tablet 3   CALCIUM  PO Take 1 tablet by mouth in the morning and at bedtime.     cefdinir  (OMNICEF )  300 MG capsule Take 1 capsule (300 mg total) by mouth 2 (two) times daily. 14 capsule 0   ferrous sulfate  325 (65 FE) MG tablet Take 1 tablet (325 mg total) by mouth 2 (two) times daily with a meal. 60 tablet 2   finasteride  (PROSCAR ) 5 MG tablet Take 5 mg by mouth daily.     folic acid  (FOLVITE ) 1 MG tablet TAKE 1 TABLET BY MOUTH EVERY DAY 90 tablet 4   gabapentin  (NEURONTIN ) 100 MG capsule TAKE 1 CAPSULE (100 MG TOTAL) BY MOUTH THREE TIMES DAILY. 90 capsule 3   loperamide  (IMODIUM  A-D) 2 MG tablet Take 6 mg by mouth 4 (four) times daily.     magic mouthwash SOLN Take 15 mLs by mouth 4 (four) times daily as needed for mouth pain (sore throat). Suspension contains equal amounts of Maalox Extra Strength, nystatin , and diphenhydramine . (Patient not taking: No sig reported)     mirtazapine  (REMERON ) 30 MG tablet Take 30 mg by mouth at bedtime.     primidone  (MYSOLINE ) 50 MG tablet TAKE 1 TABLET BY MOUTH THREE TIMES A DAY 270 tablet 3   tamsulosin  (FLOMAX ) 0.4 MG CAPS capsule Take 0.4 mg by mouth in the morning and at bedtime.     venlafaxine  XR (EFFEXOR -XR) 150 MG 24 hr capsule Take 150 mg by mouth daily.      Allergies: Allergies[1]  Social History: Social History[2]  Family History Family History  Problem Relation Age of Onset   Hypertension Mother    Cancer Father    Bone cancer Sister     Review of Systems  Genitourinary:  Positive for hematuria. Negative for dysuria, flank pain, frequency and urgency.     Objective   Vital signs in last 24 hours: BP 105/72 (BP Location: Left Arm)   Pulse (!) 105   Temp 98.4 F (36.9 C) (Oral)   Resp 18   SpO2 98%   Physical Exam:  General: A&O, resting, appropriate HEENT: [place]/AT Pulmonary: Normal work of breathing Cardiovascular: no cyanosis GU: Three-way Foley catheter in place.  CBI on low/medium gtt.   Most Recent Labs: Lab Results  Component Value Date   WBC 12.9 (H) 06/09/2024   HGB 8.8 (L) 06/09/2024   HCT 27.9 (L)  06/09/2024   PLT 233 06/09/2024    Lab Results  Component Value Date   NA 136 06/09/2024   K 5.1 06/09/2024   CL 104 06/09/2024   CO2 21 (L) 06/09/2024   BUN 40 (H) 06/09/2024  CREATININE 2.00 (H) 06/09/2024   CALCIUM  9.0 06/09/2024   MG 1.7 03/18/2024   PHOS 3.6 03/18/2024    Lab Results  Component Value Date   INR 1.1 06/09/2024   APTT 33 07/02/2023     Urine Culture: @[id number] (laburin,org,[id number],[id number])@   IMAGING: No results found.  ------  Ole Bourdon, NP Pager: [phone]   Please contact the urology consult pager with any further questions/concerns.     [1]  Allergies Allergen Reactions   Albuterol  Palpitations and Other (See Comments)    Supraventricular Tachycardia   [2]  Social History Tobacco Use   Smoking status: Never   Smokeless tobacco: Never  Vaping Use   Vaping status: Never Used  Substance Use Topics   Alcohol use: Not Currently   Drug use: No   "

## 2024-06-10 DIAGNOSIS — Z792 Long term (current) use of antibiotics: Secondary | ICD-10-CM | POA: Diagnosis not present

## 2024-06-10 DIAGNOSIS — R310 Gross hematuria: Secondary | ICD-10-CM | POA: Diagnosis not present

## 2024-06-10 DIAGNOSIS — Z6826 Body mass index (BMI) 26.0-26.9, adult: Secondary | ICD-10-CM | POA: Diagnosis not present

## 2024-06-10 DIAGNOSIS — I5022 Chronic systolic (congestive) heart failure: Secondary | ICD-10-CM | POA: Diagnosis not present

## 2024-06-10 DIAGNOSIS — I130 Hypertensive heart and chronic kidney disease with heart failure and stage 1 through stage 4 chronic kidney disease, or unspecified chronic kidney disease: Secondary | ICD-10-CM | POA: Diagnosis not present

## 2024-06-10 DIAGNOSIS — G9341 Metabolic encephalopathy: Secondary | ICD-10-CM | POA: Diagnosis not present

## 2024-06-10 DIAGNOSIS — D631 Anemia in chronic kidney disease: Secondary | ICD-10-CM | POA: Diagnosis not present

## 2024-06-10 DIAGNOSIS — E46 Unspecified protein-calorie malnutrition: Secondary | ICD-10-CM | POA: Diagnosis not present

## 2024-06-10 DIAGNOSIS — F0390 Unspecified dementia without behavioral disturbance: Secondary | ICD-10-CM | POA: Diagnosis not present

## 2024-06-10 DIAGNOSIS — Z9181 History of falling: Secondary | ICD-10-CM | POA: Diagnosis not present

## 2024-06-10 DIAGNOSIS — Z556 Problems related to health literacy: Secondary | ICD-10-CM | POA: Diagnosis not present

## 2024-06-10 DIAGNOSIS — N1831 Chronic kidney disease, stage 3a: Secondary | ICD-10-CM | POA: Diagnosis not present

## 2024-06-10 LAB — CBC
HCT: 26 % — ABNORMAL LOW (ref 39.0–52.0)
Hemoglobin: 7.9 g/dL — ABNORMAL LOW (ref 13.0–17.0)
MCH: 28.2 pg (ref 26.0–34.0)
MCHC: 30.4 g/dL (ref 30.0–36.0)
MCV: 92.9 fL (ref 80.0–100.0)
Platelets: 184 K/uL (ref 150–400)
RBC: 2.8 MIL/uL — ABNORMAL LOW (ref 4.22–5.81)
RDW: 15 % (ref 11.5–15.5)
WBC: 9 K/uL (ref 4.0–10.5)
nRBC: 0 % (ref 0.0–0.2)

## 2024-06-10 LAB — BASIC METABOLIC PANEL WITH GFR
Anion gap: 10 (ref 5–15)
BUN: 33 mg/dL — ABNORMAL HIGH (ref 8–23)
CO2: 22 mmol/L (ref 22–32)
Calcium: 8.5 mg/dL — ABNORMAL LOW (ref 8.9–10.3)
Chloride: 107 mmol/L (ref 98–111)
Creatinine, Ser: 2.01 mg/dL — ABNORMAL HIGH (ref 0.61–1.24)
GFR, Estimated: 34 mL/min — ABNORMAL LOW
Glucose, Bld: 108 mg/dL — ABNORMAL HIGH (ref 70–99)
Potassium: 4.5 mmol/L (ref 3.5–5.1)
Sodium: 139 mmol/L (ref 135–145)

## 2024-06-10 MED ORDER — ARIPIPRAZOLE 2 MG PO TABS
2.0000 mg | ORAL_TABLET | Freq: Every day | ORAL | Status: DC
  Administered 2024-06-10: 2 mg via ORAL
  Filled 2024-06-10: qty 1

## 2024-06-10 MED ORDER — PRIMIDONE 50 MG PO TABS
50.0000 mg | ORAL_TABLET | Freq: Two times a day (BID) | ORAL | Status: DC
  Administered 2024-06-10: 50 mg via ORAL
  Filled 2024-06-10: qty 1

## 2024-06-10 MED ORDER — FOLIC ACID 1 MG PO TABS
1.0000 mg | ORAL_TABLET | Freq: Every day | ORAL | Status: DC
  Administered 2024-06-10: 1 mg via ORAL
  Filled 2024-06-10: qty 1

## 2024-06-10 MED ORDER — VENLAFAXINE HCL ER 150 MG PO CP24
150.0000 mg | ORAL_CAPSULE | Freq: Every day | ORAL | Status: DC
  Filled 2024-06-10: qty 1

## 2024-06-10 MED ORDER — FERROUS SULFATE 325 (65 FE) MG PO TABS: 325.0000 mg | ORAL_TABLET | Freq: Every day | ORAL | Status: DC

## 2024-06-10 MED ORDER — PRAVASTATIN SODIUM 20 MG PO TABS: 20.0000 mg | ORAL_TABLET | Freq: Every day | ORAL | Status: DC

## 2024-06-10 MED ORDER — BUSPIRONE HCL 5 MG PO TABS
10.0000 mg | ORAL_TABLET | Freq: Three times a day (TID) | ORAL | Status: DC
  Administered 2024-06-10: 10 mg via ORAL
  Filled 2024-06-10: qty 2

## 2024-06-10 MED ORDER — SODIUM CHLORIDE 0.9 % IV SOLN
1.0000 g | Freq: Once | INTRAVENOUS | Status: AC
  Administered 2024-06-10: 1 g via INTRAVENOUS
  Filled 2024-06-10: qty 10

## 2024-06-10 MED ORDER — TAMSULOSIN HCL 0.4 MG PO CAPS
0.4000 mg | ORAL_CAPSULE | Freq: Two times a day (BID) | ORAL | Status: DC
  Filled 2024-06-10: qty 1

## 2024-06-10 MED ORDER — FINASTERIDE 5 MG PO TABS
5.0000 mg | ORAL_TABLET | Freq: Every day | ORAL | Status: DC
  Administered 2024-06-10: 5 mg via ORAL
  Filled 2024-06-10: qty 1

## 2024-06-10 MED ORDER — GABAPENTIN 100 MG PO CAPS
100.0000 mg | ORAL_CAPSULE | Freq: Three times a day (TID) | ORAL | Status: DC
  Administered 2024-06-10: 100 mg via ORAL
  Filled 2024-06-10: qty 1

## 2024-06-10 MED ORDER — MIRTAZAPINE 15 MG PO TABS: 30.0000 mg | ORAL_TABLET | Freq: Every day | ORAL | Status: DC

## 2024-06-10 NOTE — Progress Notes (Signed)
 Mobility Specialist - Progress Note   06/10/24 1056  Mobility  Activity Ambulated with assistance  Level of Assistance Contact guard assist, steadying assist  Assistive Device Other (Comment) (Hallway Rail)  Distance Ambulated (ft) 450 ft  Range of Motion/Exercises Active  Activity Response Tolerated well  Mobility visit 1 Mobility  Mobility Specialist Start Time (ACUTE ONLY) 1035  Mobility Specialist Stop Time (ACUTE ONLY) 1056  Mobility Specialist Time Calculation (min) (ACUTE ONLY) 21 min   Pt was found on recliner chair eager to mobilize. No complaints. At EOS returned to recliner chair with all needs met. Call bell in reach and chair alarm on.  Erminio Leos,  Mobility Specialist Can be reached via Secure Chat

## 2024-06-10 NOTE — Plan of Care (Signed)

## 2024-06-10 NOTE — Discharge Summary (Signed)
 Physician Discharge Summary  Calvin Escobar FMW:[id number] DOB: Apr 01, 1951 DOA: 06/09/2024  PCP: [name], MD  Admit date: 06/09/2024 Discharge date: 06/10/2024  Admitted From: Home Disposition: Home  Recommendations for Outpatient Follow-up:  Follow up with PCP in 1 week with repeat CBC/BMP Outpatient follow-up with urology Follow up in ED if symptoms worsen or new appear   Home Health: No Equipment/Devices: None  Discharge Condition: Stable CODE STATUS: Full Diet recommendation: Heart healthy  Brief/Interim Summary: 74 y.o. male with medical history significant for heart failure with reduced EF, recurrent UTIs, CKD stage IIIa and recent TURP for BPH at the end of December being admitted to the hospital with gross hematuria after recent Foley catheter removal as an outpatient.  He went to urology clinic for the same, had Foley catheter placed and irrigation done but it had continued.  Hence referred to the hospital for admission.  During the hospitalization, he underwent CBI as per urology.  Subsequently, hematuria has resolved, Foley catheter has already been removed this morning and patient is voiding normally without hematuria.  Urology has cleared the patient for discharge.  Discharge patient home today with outpatient follow-up with PCP and urology.  Discharge Diagnoses:   Gross hematuria Acute urinary retention BPH - Had recent TURP.  Presented with gross hematuria after recent Foley catheter removal as an outpatient.  He went to urology clinic for the same, had Foley catheter placed and irrigation done but it had continued.  Hence referred to the hospital for admission.  During the hospitalization, he underwent CBI as per urology.  Subsequently, hematuria has resolved, Foley catheter has already been removed this morning and patient is voiding normally without hematuria.  Urology has cleared the patient for discharge.  Discharge patient home today with outpatient follow-up  with PCP and urology. - Continue Flomax   Anemia of chronic disease - Hemoglobin stable.  Outpatient follow-up  Chronic systolic heart failure - Compensated.  Outpatient follow-up with PCP and cardiology  Hyperlipidemia Continue statin  Anxiety and depression Continue Effexor , mirtazapine , buspirone  and aripiprazole   CKD stage IIIa - Creatinine currently stable.  Monitor intermittently as an outpatient  Leukocytosis - Resolved  Acute metabolic acidosis - Resolved  OSA - Continue CPAP at night  Discharge Instructions  Discharge Instructions     Diet - low sodium heart healthy   Complete by: As directed    Increase activity slowly   Complete by: As directed       Allergies as of 06/10/2024       Reactions   Albuterol  Palpitations, Other (See Comments)   Supraventricular Tachycardia   Other Itching, Other (See Comments)   Pollen - Itchy/watery eyes and sneezing        Medication List     STOP taking these medications    cefdinir  300 MG capsule Commonly known as: OMNICEF    loperamide  2 MG tablet Commonly known as: IMODIUM  A-D       TAKE these medications    ARIPiprazole  2 MG tablet Commonly known as: ABILIFY  Take 2 mg by mouth in the morning.   busPIRone  10 MG tablet Commonly known as: BUSPAR  Take 1 tablet (10 mg total) by mouth 3 (three) times daily.   CALCIUM  PO Take 1 tablet by mouth in the morning and at bedtime.   ferrous sulfate  325 (65 FE) MG tablet Take 1 tablet (325 mg total) by mouth 2 (two) times daily with a meal.   finasteride  5 MG tablet Commonly known as: PROSCAR  Take  5 mg by mouth daily.   folic acid  1 MG tablet Commonly known as: FOLVITE  TAKE 1 TABLET BY MOUTH EVERY DAY   gabapentin  100 MG capsule Commonly known as: NEURONTIN  TAKE 1 CAPSULE (100 MG TOTAL) BY MOUTH THREE TIMES DAILY.   magic mouthwash Soln Take 15 mLs by mouth 4 (four) times daily as needed for mouth pain (sore throat). Suspension contains equal  amounts of Maalox Extra Strength, nystatin , and diphenhydramine .   mirtazapine  30 MG tablet Commonly known as: REMERON  Take 30 mg by mouth at bedtime.   pravastatin  20 MG tablet Commonly known as: PRAVACHOL  Take 20 mg by mouth at bedtime.   primidone  50 MG tablet Commonly known as: MYSOLINE  TAKE 1 TABLET BY MOUTH THREE TIMES A DAY   tamsulosin  0.4 MG Caps capsule Commonly known as: FLOMAX  Take 0.4 mg by mouth in the morning and at bedtime.   venlafaxine  XR 150 MG 24 hr capsule Commonly known as: EFFEXOR -XR Take 150 mg by mouth daily with breakfast.   VITAMIN C PO Take 1 tablet by mouth daily.        Follow-up Information     [name], MD. Schedule an appointment as soon as possible for a visit in 1 week(s).   Specialty: Internal Medicine Contact information: [address] [place] [address] [phone]                Allergies[1]  Consultations: Urology   Procedures/Studies: DG Elbow Complete Left Result Date: 05/22/2024 EXAM: 3 VIEW(S) XRAY OF THE LEFT ELBOW COMPARISON: None available. CLINICAL HISTORY: fall FINDINGS: BONES AND JOINTS: Nondisplaced intra-articular radial head fracture. Small elbow effusion. No malalignment. SOFT TISSUES: The soft tissues are unremarkable. IMPRESSION: 1. Nondisplaced intra-articular radial head fracture. 2. Small elbow effusion. Electronically signed by: Greig Pique MD 05/22/2024 03:13 PM EST RP Workstation: [id number]   CT Cervical Spine Wo Contrast Result Date: 05/22/2024 EXAM: CT CERVICAL SPINE WITHOUT CONTRAST 05/22/2024 02:41:24 PM TECHNIQUE: CT of the cervical spine was performed without the administration of intravenous contrast. Multiplanar reformatted images are provided for review. Automated exposure control, iterative reconstruction, and/or weight based adjustment of the mA/kV was utilized to reduce the radiation dose to as low as reasonably achievable. COMPARISON: CT cervical spine 06/02/2023.  CLINICAL HISTORY: Neck trauma (Age >= 65y). Multiple recent falls. FINDINGS: BONES AND ALIGNMENT: Mild chronic reversal of the normal cervical lordosis with unchanged slight anterolisthesis of C4 on C5. No acute fracture or traumatic malalignment. Unchanged nonspecific 9 mm lucent focus in the C3 left lamina. Interbody ankylosis at C4-C5 and C5-C6. Facet ankylosis at C4-C5. DEGENERATIVE CHANGES: Advanced upper cervical facet arthrosis. Severe widespread neural foraminal stenosis due to uncovertebral and facet spurring. Suspected moderate spinal stenosis at C5-C6. SOFT TISSUES: No prevertebral soft tissue swelling. IMPRESSION: 1. No acute cervical spine fracture or traumatic malalignment. Electronically signed by: Dasie Hamburg MD 05/22/2024 03:02 PM EST RP Workstation: [id number]   CT Head Wo Contrast Result Date: 05/22/2024 EXAM: CT HEAD WITHOUT CONTRAST 05/22/2024 02:41:24 PM TECHNIQUE: CT of the head was performed without the administration of intravenous contrast. Automated exposure control, iterative reconstruction, and/or weight based adjustment of the mA/kV was utilized to reduce the radiation dose to as low as reasonably achievable. COMPARISON: head CT 07/17/2023 CLINICAL HISTORY: Head trauma, minor. Multiple recent falls. FINDINGS: BRAIN AND VENTRICLES: There is no evidence of an acute infarct, intracranial hemorrhage, mass, midline shift, hydrocephalus, or extra-axial fluid collection. Generalized cerebral atrophy is mild for age. ORBITS: Bilateral cataract extraction. SINUSES:  No acute abnormality. SOFT TISSUES AND SKULL: No acute soft tissue abnormality. No skull fracture. IMPRESSION: 1. No acute intracranial abnormality. Electronically signed by: Dasie Hamburg MD 05/22/2024 02:52 PM EST RP Workstation: [id number]      Subjective: Patient seen and examined at bedside.  Denies worsening abdominal pain, fever or vomiting.  Foley catheter has been removed this morning.  Discharge Exam: Vitals:    06/09/24 2143 06/10/24 0425  BP: (!) 153/82 (!) 140/90  Pulse: 91 96  Resp: 19 17  Temp: 98.2 F (36.8 C) 98.6 F (37 C)  SpO2: 97% 97%    General: Pt is alert, awake, not in acute distress.  Chronically ill and deconditioned looking.  On room air. Cardiovascular: rate controlled, S1/S2 + Respiratory: bilateral decreased breath sounds at bases Abdominal: Soft, NT, ND, bowel sounds + Extremities: no edema, no cyanosis    The results of significant diagnostics from this hospitalization (including imaging, microbiology, ancillary and laboratory) are listed below for reference.     Microbiology: No results found for this or any previous visit (from the past 240 hours).   Labs: BNP (last 3 results) Recent Labs    12/16/23 0615 12/17/23 0601 03/07/24 0551  BNP 868.0* 742.1* 573.1*   Basic Metabolic Panel: Recent Labs  Lab 06/09/24 0947 06/10/24 0526  NA 136 139  K 5.1 4.5  CL 104 107  CO2 21* 22  GLUCOSE 107* 108*  BUN 40* 33*  CREATININE 2.00* 2.01*  CALCIUM  9.0 8.5*   Liver Function Tests: Recent Labs  Lab 06/09/24 0947  AST 16  ALT 5  ALKPHOS 97  BILITOT 0.2  PROT 7.3  ALBUMIN  3.9   No results for input(s): LIPASE, AMYLASE in the last 168 hours. No results for input(s): AMMONIA in the last 168 hours. CBC: Recent Labs  Lab 06/09/24 0947 06/10/24 0526  WBC 12.9* 9.0  HGB 8.8* 7.9*  HCT 27.9* 26.0*  MCV 92.7 92.9  PLT 233 184   Cardiac Enzymes: No results for input(s): CKTOTAL, CKMB, CKMBINDEX, TROPONINI in the last 168 hours. BNP: Invalid input(s): POCBNP CBG: No results for input(s): GLUCAP in the last 168 hours. D-Dimer No results for input(s): DDIMER in the last 72 hours. Hgb A1c No results for input(s): HGBA1C in the last 72 hours. Lipid Profile No results for input(s): CHOL, HDL, LDLCALC, TRIG, CHOLHDL, LDLDIRECT in the last 72 hours. Thyroid  function studies No results for input(s): TSH,  T4TOTAL, T3FREE, THYROIDAB in the last 72 hours.  Invalid input(s): FREET3 Anemia work up No results for input(s): VITAMINB12, FOLATE, FERRITIN, TIBC, IRON , RETICCTPCT in the last 72 hours. Urinalysis    Component Value Date/Time   COLORURINE YELLOW 03/06/2024 1513   APPEARANCEUR TURBID (A) 03/06/2024 1513   LABSPEC 1.006 03/06/2024 1513   PHURINE 6.0 03/06/2024 1513   GLUCOSEU NEGATIVE 03/06/2024 1513   HGBUR MODERATE (A) 03/06/2024 1513   BILIRUBINUR NEGATIVE 03/06/2024 1513   KETONESUR NEGATIVE 03/06/2024 1513   PROTEINUR 100 (A) 03/06/2024 1513   UROBILINOGEN 0.2 09/12/2013 1446   NITRITE NEGATIVE 03/06/2024 1513   LEUKOCYTESUR MODERATE (A) 03/06/2024 1513   Sepsis Labs Recent Labs  Lab 06/09/24 0947 06/10/24 0526  WBC 12.9* 9.0   Microbiology No results found for this or any previous visit (from the past 240 hours).   Time coordinating discharge: 35 minutes  SIGNED:   Sophie Mao, MD  [facility] 06/10/2024, 12:28 PM      [1]  Allergies Allergen Reactions   Albuterol  Palpitations and Other (  See Comments)    Supraventricular Tachycardia   Other Itching and Other (See Comments)    Pollen - Itchy/watery eyes and sneezing

## 2024-06-10 NOTE — Progress Notes (Signed)
 Wife called and updated on patient's discharge order. All questions/concerns answered. AVS reviewed with patient. Patient stable at discharge and transported home via wife.

## 2024-06-10 NOTE — Progress Notes (Signed)
" ° °  °  Subjective: No acute events overnight.  Patient sleeping on rounds.  I did not wake him.  Clear yellow urine.  Objective: Vital signs in last 24 hours: Temp:  [98.1 F (36.7 C)-98.6 F (37 C)] 98.6 F (37 C) (01/07 0425) Pulse Rate:  [82-105] 96 (01/07 0425) Resp:  [17-19] 17 (01/07 0425) BP: (105-157)/(72-96) 140/90 (01/07 0425) SpO2:  [97 %-100 %] 97 % (01/07 0425) Weight:  [98.3 kg] 98.3 kg (01/06 1933)  Assessment/Plan: # Gross hematuria Responded quickly to CBI.  Off at the indeterminate time overnight.  Clear yellow urine on rounds this morning.  Discontinue CBI and voiding trial.  Intake/Output from previous day: 01/06 0701 - 01/07 0700 In: 4240 [P.O.:240; IV Piggyback:1000] Out: 6450 [Urine:6450]  Intake/Output this shift: No intake/output data recorded.  Physical Exam:  General: Sleeping CV: No cyanosis Lungs: equal chest rise Gu: Three-way Foley catheter in place.  Clear yellow urine.  Lab Results: Recent Labs    06/09/24 0947 06/10/24 0526  HGB 8.8* 7.9*  HCT 27.9* 26.0*   BMET Recent Labs    06/09/24 0947 06/10/24 0526  NA 136 139  K 5.1 4.5  CL 104 107  CO2 21* 22  GLUCOSE 107* 108*  BUN 40* 33*  CREATININE 2.00* 2.01*  CALCIUM  9.0 8.5*  HGB 8.8* 7.9*  WBC 12.9* 9.0     Studies/Results: No results found.    LOS: 0 days   Ole Bourdon, NP [facility] Pager: [phone]  06/10/2024, 8:35 AM  "

## 2024-06-11 ENCOUNTER — Telehealth: Payer: Self-pay

## 2024-06-11 NOTE — Telephone Encounter (Signed)
 Faxed over home health certification and plan of care to [phone]

## 2024-06-11 NOTE — Telephone Encounter (Signed)
 [facility]: calling about pt being in hospital just got him yesterday would like to know if provider would like for the RN to draw labs Monday morning before pt appt with pcp on Tuesday so he would have at his appt. CBC and what other labs if so. Told rebecca soon as provider advised I would call her bk with update.

## 2024-06-12 NOTE — Telephone Encounter (Signed)
 Tried to call rebecca back to give verbal orders. Gave orders via phone

## 2024-06-15 ENCOUNTER — Other Ambulatory Visit (INDEPENDENT_AMBULATORY_CARE_PROVIDER_SITE_OTHER)

## 2024-06-15 ENCOUNTER — Other Ambulatory Visit: Payer: Self-pay

## 2024-06-15 DIAGNOSIS — E7800 Pure hypercholesterolemia, unspecified: Secondary | ICD-10-CM

## 2024-06-15 DIAGNOSIS — N1831 Chronic kidney disease, stage 3a: Secondary | ICD-10-CM

## 2024-06-15 DIAGNOSIS — N189 Chronic kidney disease, unspecified: Secondary | ICD-10-CM

## 2024-06-15 DIAGNOSIS — N179 Acute kidney failure, unspecified: Secondary | ICD-10-CM | POA: Diagnosis not present

## 2024-06-15 LAB — CBC WITH DIFFERENTIAL/PLATELET
Basophils Absolute: 0.2 K/uL — ABNORMAL HIGH (ref 0.0–0.1)
Basophils Relative: 2.9 % (ref 0.0–3.0)
Eosinophils Absolute: 0.2 K/uL (ref 0.0–0.7)
Eosinophils Relative: 2.9 % (ref 0.0–5.0)
HCT: 26.5 % — ABNORMAL LOW (ref 39.0–52.0)
Hemoglobin: 8.6 g/dL — ABNORMAL LOW (ref 13.0–17.0)
Lymphocytes Relative: 26.6 % (ref 12.0–46.0)
Lymphs Abs: 1.7 K/uL (ref 0.7–4.0)
MCHC: 32.6 g/dL (ref 30.0–36.0)
MCV: 87.7 fl (ref 78.0–100.0)
Monocytes Absolute: 0.4 K/uL (ref 0.1–1.0)
Monocytes Relative: 6.2 % (ref 3.0–12.0)
Neutro Abs: 3.9 K/uL (ref 1.4–7.7)
Neutrophils Relative %: 61.4 % (ref 43.0–77.0)
Platelets: 259 K/uL (ref 150.0–400.0)
RBC: 3.01 Mil/uL — ABNORMAL LOW (ref 4.22–5.81)
RDW: 15.6 % — ABNORMAL HIGH (ref 11.5–15.5)
WBC: 6.3 K/uL (ref 4.0–10.5)

## 2024-06-15 LAB — BASIC METABOLIC PANEL WITH GFR
BUN: 29 mg/dL — ABNORMAL HIGH (ref 6–23)
CO2: 23 meq/L (ref 19–32)
Calcium: 8.6 mg/dL (ref 8.4–10.5)
Chloride: 109 meq/L (ref 96–112)
Creatinine, Ser: 1.88 mg/dL — ABNORMAL HIGH (ref 0.40–1.50)
GFR: 34.5 mL/min — ABNORMAL LOW
Glucose, Bld: 88 mg/dL (ref 70–99)
Potassium: 5.2 meq/L — ABNORMAL HIGH (ref 3.5–5.1)
Sodium: 139 meq/L (ref 135–145)

## 2024-06-16 ENCOUNTER — Telehealth: Payer: Self-pay

## 2024-06-16 ENCOUNTER — Encounter: Payer: Self-pay | Admitting: Internal Medicine

## 2024-06-16 ENCOUNTER — Ambulatory Visit (INDEPENDENT_AMBULATORY_CARE_PROVIDER_SITE_OTHER): Admitting: Internal Medicine

## 2024-06-16 ENCOUNTER — Ambulatory Visit: Payer: Self-pay | Admitting: Internal Medicine

## 2024-06-16 VITALS — BP 110/66 | HR 78 | Temp 98.0°F | Ht 73.0 in | Wt 216.0 lb

## 2024-06-16 DIAGNOSIS — R5383 Other fatigue: Secondary | ICD-10-CM | POA: Diagnosis not present

## 2024-06-16 DIAGNOSIS — H547 Unspecified visual loss: Secondary | ICD-10-CM | POA: Insufficient documentation

## 2024-06-16 DIAGNOSIS — E43 Unspecified severe protein-calorie malnutrition: Secondary | ICD-10-CM | POA: Diagnosis not present

## 2024-06-16 DIAGNOSIS — D638 Anemia in other chronic diseases classified elsewhere: Secondary | ICD-10-CM | POA: Diagnosis not present

## 2024-06-16 DIAGNOSIS — E639 Nutritional deficiency, unspecified: Secondary | ICD-10-CM | POA: Diagnosis not present

## 2024-06-16 DIAGNOSIS — N39498 Other specified urinary incontinence: Secondary | ICD-10-CM

## 2024-06-16 DIAGNOSIS — R6889 Other general symptoms and signs: Secondary | ICD-10-CM

## 2024-06-16 DIAGNOSIS — R32 Unspecified urinary incontinence: Secondary | ICD-10-CM | POA: Insufficient documentation

## 2024-06-16 DIAGNOSIS — N184 Chronic kidney disease, stage 4 (severe): Secondary | ICD-10-CM

## 2024-06-16 DIAGNOSIS — G479 Sleep disorder, unspecified: Secondary | ICD-10-CM | POA: Insufficient documentation

## 2024-06-16 NOTE — Telephone Encounter (Signed)
 Do you know reason for referral did you all discuss today  Copied from CRM #[id number]. Topic: Referral - Request for Referral >> Jun 16, 2024 12:32 PM Mia F wrote: Did the patient discuss referral with their provider in the last year? Yes (If No - schedule appointment) (If Yes - send message)  Appointment offered? No  Type of order/referral and detailed reason for visit: Pt was just seen and Dr Jesus needed the provider info of the ENT Specialist pt wanted to see   Preference of office, provider, location: [facility] [address] [place], [place]  [phone] Phone   If referral order, have you been seen by this specialty before? No (If Yes, this issue or another issue? When? Where?  Can we respond through MyChart? No

## 2024-06-16 NOTE — Addendum Note (Signed)
 Addended by: Ninette Cotta G on: 06/16/2024 08:06 PM   Modules accepted: Orders

## 2024-06-16 NOTE — Assessment & Plan Note (Signed)
 He reports difficulty with vision, possibly related to eye problems. An eye examination is scheduled. Ensure follow-up with the eye examination at [facility].

## 2024-06-16 NOTE — Assessment & Plan Note (Signed)
 Shared decision-making done; patient understood rationale and agreed to [facility]

## 2024-06-16 NOTE — Assessment & Plan Note (Signed)
 The importance of regular exercise and maintaining a healthy diet was discussed. He is encouraged to engage in physical activity to improve overall health and muscle tone. Regular exercise, including walking and weightlifting, is encouraged. Protein shakes and creatine supplements are recommended to support muscle gain. He is advised to follow up with an eye examination and an ENT specialist for further evaluation.

## 2024-06-16 NOTE — Patient Instructions (Addendum)
 It was a pleasure seeing you today! Your health and satisfaction are our top priorities.  Bernardino Cone, MD  VISIT SUMMARY: During your visit, we discussed your ongoing issues with urinary incontinence, sleep disturbances, dietary habits, and overall health. We also reviewed your recent weight loss and kidney function. You were accompanied by your wife, [name].  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 This is common after prostate surgery. We suspect a fistula due to leakage during diarrhea and have ordered a urine analysis to check for infection. You should start Kegel exercises to strengthen your pelvic floor muscles. We have referred you to a urologist for further evaluation, and if an infection is found, we will consider antibiotic treatment.  -SLEEP-WAKE DISTURBANCES: You are experiencing disruptions in your sleep pattern, likely due to frequent nighttime urination. To help improve your sleep, try to maintain a regular sleep schedule and reduce your fluid intake at night.  -PROTEIN-CALORIE MALNUTRITION: Protein-calorie malnutrition means your diet is lacking in essential nutrients. We discussed the potential benefits of testosterone therapy for muscle gain and weight increase. You should follow a balanced diet with more protein intake, and consider using protein shakes and creatine supplements. Regular physical activity is also encouraged to help you gain muscle.  -CHRONIC KIDNEY DISEASE, STAGE 3-4: Chronic kidney disease means your kidneys are not functioning at full capacity. Your kidney function has improved but remains at stage 3-4. You should avoid high potassium foods and we will monitor your kidney function with follow-up lab work in 1-4 weeks.  -VISUAL LOSS: You have reported difficulty with your vision. We have scheduled an eye examination for you. Please ensure you follow up with this  appointment at [facility].  -GENERAL HEALTH MAINTENANCE: We discussed the importance of regular exercise and maintaining a healthy diet. Engaging in physical activity, such as walking and weightlifting, is encouraged to improve your overall health and muscle tone. Protein shakes and creatine supplements are recommended to support muscle gain. Please follow up with an eye examination and an ENT specialist for further evaluation.  INSTRUCTIONS: Please follow up with the urologist for your urinary incontinence, and ensure you attend your scheduled eye examination at [facility]. We will also need to monitor your kidney function with lab work in 1-4 weeks.  Your Providers PCP: Cone [name], MD,  [phone]) Referring Provider: Cone [name], MD,  [phone]) Care Team Provider: [name], MD,  [phone]) Care Team Provider: Shona Rush, MD,  [phone]) Care Team Provider: [facility],  [phone]) Care Team Provider: [facility], [place],  [phone]) Care Team Provider: Rudy [name] LABOR, MD,  [phone]) Care Team Provider: Legrand Victory [name], MD,  [phone]) Care Team Provider: Sheldon Standing, MD,  [phone]) Care Team Provider: Carolee Sherwood [name], MD,  [phone]) Care Team Provider: Daneen Damien BROCKS, NP,  [phone])  NEXT STEPS: [x]  Early Intervention: Schedule sooner appointment, call our on-call services, or go to emergency room if there is any significant Increase in pain or discomfort New or worsening symptoms Sudden or severe changes in your health [x]  Flexible Follow-Up: We recommend a Return in about 1 month (around 07/17/2024). for optimal routine care. This allows for progress monitoring and treatment adjustments. [x]  Preventive Care: Schedule your annual preventive care visit! It's typically covered by insurance and helps identify potential health issues early. [x]  Lab & X-ray Appointments: Incomplete tests  scheduled today, or call to schedule. X-rays:  [facility] (M-F, 8:30am-noon or 1pm-5pm). [x]  Medical Information Release: Sign a release form at front desk to obtain relevant medical information we don't have.  MAKING THE MOST OF OUR FOCUSED 20 MINUTE APPOINTMENTS: [x]   Clearly state your top concerns at the beginning of the visit to focus our discussion [x]   If you anticipate you will need more time, please inform the front desk during scheduling - we can book multiple appointments in the same week. [x]   If you have transportation problems- use our convenient video appointments or ask about transportation support. [x]   We can get down to business faster if you use MyChart to update information before the visit and submit non-urgent questions before your visit. Thank you for taking the time to provide details through MyChart.  Let our nurse know and she can import this information into your encounter documents.  Arrival and Wait Times: [x]   Arriving on time ensures that everyone receives prompt attention. [x]   Early morning (8a) and afternoon (1p) appointments tend to have shortest wait times. [x]   Unfortunately, we cannot delay appointments for late arrivals or hold slots during phone calls.  Getting Answers and Following Up [x]   Simple Questions & Concerns: For quick questions or basic follow-up after your visit, reach us  at [phone] or MyChart messaging. [x]   Complex Concerns: If your concern is more complex, scheduling an appointment might be best. Discuss this with the staff to find the most suitable option. [x]   Lab & Imaging Results: We'll contact you directly if results are abnormal or you don't use MyChart. Most normal results will be on MyChart within 2-3 business days, with a review message from Dr. Jesus. Haven't heard back in 2 weeks? Need results sooner? Contact us  at [phone]. [x]   Referrals: Our referral coordinator will manage specialist referrals.  The specialist's office should contact you within 2 weeks to schedule an appointment. Call us  if you haven't heard from them after 2 weeks.  Staying Connected [x]   MyChart: Activate your MyChart for the fastest way to access results and message us . See the last page of this paperwork for instructions on how to activate.  Bring to Your Next Appointment [x]   Medications: Please bring all your medication bottles to your next appointment to ensure we have an accurate record of your prescriptions. [x]   Health Diaries: If you're monitoring any health conditions at home, keeping a diary of your readings can be very helpful for discussions at your next appointment.  Billing [x]   X-ray & Lab Orders: These are billed by separate companies. Contact the invoicing company directly for questions or concerns. [x]   Visit Charges: Discuss any billing inquiries with our administrative services team.  Your Satisfaction Matters [x]   Share Your Experience: We strive for your satisfaction! If you have any complaints, or preferably compliments, please let Dr. Jesus know directly or contact our Practice Administrators, Manuelita Rubin or [employer], by asking at the front desk.   Reviewing Your Records [x]   Review this early draft of your clinical encounter notes below and the final encounter summary tomorrow on MyChart after its been completed.  All orders placed so far are visible here: Sleep disturbances -     CBC with Differential/Platelet; Future -     Basic metabolic panel with GFR; Future  Other urinary incontinence -     Urinalysis w microscopic + reflex cultur; Future  Poor diet  Other fatigue -     Testosterone; Future  Worsening  vision -     Ambulatory referral to Ophthalmology  Anemia of chronic disease

## 2024-06-16 NOTE — Progress Notes (Signed)
 ==============================  [facility]: [phone]   -- Medical Office Visit --  Patient: Calvin Escobar      Age: 74 y.o.       Sex:  male  Date:   06/16/2024 Today's Healthcare Provider: Bernardino KANDICE Cone, MD  ==============================   Chief Complaint: Hospitalization Follow-up (Pt is not eating per his wife had labs yesterday here to review as well.  Drinks one soda a day he states he does not like water .)  Discussed the use of AI scribe software for clinical note transcription with the patient, who gave verbal consent to proceed.  History of Present Illness 74 year old male with a history of prostate surgery who presents with urinary incontinence and sleep disturbances. He is accompanied by his wife, [name].  He has been experiencing significant urinary incontinence following prostate surgery, with episodes of uncontrollable urination, particularly during the day. Initially, this required frequent changes of clothing, but over the past week, there has been some improvement, with leakage now limited to a small area between his legs. The leakage is less severe than when the catheter was first removed. He does not use protective garments like diapers, preferring sweatpants for comfort. Leakage is more pronounced when he has diarrhea.  He experiences nocturia, needing to urinate every two hours at night, which disrupts his sleep. This has led to a reversal of his sleep pattern, causing him to sleep during the day and remain awake at night. He attributes this to the frequent need to urinate and the inability to hold urine as he could when he was younger.  His dietary habits have changed, with a preference for foods like hot dogs, subs, and ice cream, while avoiding healthier options. He acknowledges a sensitivity to apple products, which he avoids due to gastrointestinal discomfort.  He has experienced significant weight loss, dropping  from 330 pounds to 220 pounds, and wants to gain weight, ideally reaching 230 to 240 pounds. He is unsure how to achieve this and has not been driving due to his health issues. He acknowledges the need for more exercise.  He reports a history of blood in his urine following catheter removal, which required hospitalization for flushing of blood clots. This issue has since resolved, and he no longer observes blood in his urine.  Lab Results  Component Value Date   PSA 0.61 08/14/2023   No results found for: TESTOSTERONE  Background Reviewed: Problem List: has Bipolar affective disorder, depressed (HCC); Chronic bronchitis (HCC); Dementia (HCC); HLD (hyperlipidemia); Hyperkalemia; DCM (dilated cardiomyopathy) (HCC); OSA treated with BiPAP; Ascending aorta dilatation; Alcoholism in remission (HCC); BPH (benign prostatic hyperplasia); Primary insomnia; Generalized arthritis; Hypertension; Limp; Gynecomastia, male; Chronic kidney disease (CKD), active medical management without dialysis, stage 4 (severe) (HCC); Chronic kidney disease, stage 3a (HCC); Tremor; Anemia of chronic disease; LBBB (left bundle branch block); Recurrent UTI; GERD (gastroesophageal reflux disease); Weight loss, abnormal; Hypoalbuminemia; Hydronephrosis, right; Prostatic mass; Medication management; Chronic HFrEF (heart failure with reduced ejection fraction) (HCC); Malnutrition of moderate degree; OSA on CPAP; Diverticulitis of sigmoid colon s/p robotic colectomy 01/09/2024; Chronic hyponatremia; Protein-calorie malnutrition, severe; Gastrointestinal hemorrhage with melena; Postoperative anemia due to acute blood loss; Sleep disturbances; Absence of bladder continence; Poor diet; Other fatigue; and Worsening vision on their problem list. Past Medical History:  has a past medical history of Acute cystitis (05/13/2023), Acute encephalopathy (03/06/2024), Acute hyponatremia (05/13/2023), Acute kidney injury (02/05/2024), Acute kidney injury  superimposed on chronic kidney disease (  05/13/2023), Acute kidney injury superimposed on stage 3a chronic kidney disease (HCC) with hyperkalemia (03/06/2024), Acute metabolic encephalopathy (07/12/2023), Acute respiratory failure with hypoxia (HCC) (06/02/2023), AKI (acute kidney injury) (05/13/2023), Allergy (seasonal), Anxiety (12/02/2023), Ascending aorta dilatation, BPH (benign prostatic hyperplasia), C. 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), COVID-19 (12/11/2023), DCM (dilated cardiomyopathy) (HCC), Dementia (HCC), Depression, Diarrhea (05/13/2023), E coli bacteremia (05/14/2023), Gross hematuria (06/09/2024), Hematuria (12/02/2023), High anion gap metabolic acidosis (07/13/2023), History of Clostridioides difficile colitis (08/15/2023), History of COVID-19 (01/09/2024), History of urinary retention (01/09/2024), Hyperkalemia, Hyperlipidemia, Hypertension, Hypocalcemia (08/14/2023), 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), Lack of appetite (05/08/2023), Leukocytosis (12/11/2023), Lithium  toxicity (01/29/2016), Morbid obesity (HCC) (03/27/2012), Obesity (BMI 30-39.9) (05/13/2023), OSA treated with BiPAP, PAC (premature atrial contraction) (07/17/2019), Pneumonia, PUD (peptic ulcer disease) (01/12/2015), Septic shock from UTI (06/02/2023), Severe sepsis (HCC) (06/03/2023), SIRS (systemic inflammatory response syndrome) (HCC) (12/11/2023), SKIN CANCER, HX OF (05/10/2007), 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 Past Surgical History:   has a past surgical history that includes Colonoscopy; RIGHT/LEFT HEART CATH AND CORONARY ANGIOGRAPHY (N/A, 07/03/2019); Colonoscopy (N/A, 12/05/2023); Colectomy, sigmoid, robot-assisted (N/A, 01/09/2024); Laparoscopic loop colostomy (N/A, 01/09/2024); Flexible sigmoidoscopy (N/A, 01/09/2024); Robotic assisted laparoscopic lysis of adhesion (01/09/2024); Cystoscopy with indocyanine green  imaging (icg) (N/A, 01/09/2024); Ileostomy closure (N/A, 03/12/2024); Rectal exam under anesthesia (N/A, 03/12/2024); Esophagogastroduodenoscopy (N/A, 03/17/2024); and Transurethral resection of prostate (N/A, 05/25/2024). Social History:   reports that he has never smoked. He has never used smokeless tobacco. He reports that he does not currently use alcohol. He reports that he does not use drugs. Family History:  family history includes Bone cancer in his sister; Cancer in his father; Hypertension in his mother. Allergies:  is allergic to albuterol  and other.   Medication Reconciliation: Current Outpatient Medications on File Prior to Visit  Medication Sig   ARIPiprazole  (ABILIFY ) 2 MG tablet Take 2 mg by mouth in the morning.   Ascorbic Acid (VITAMIN C PO) Take 1 tablet by mouth daily.   busPIRone  (BUSPAR ) 10 MG tablet Take 1 tablet (10 mg total) by mouth 3 (three) times daily.   CALCIUM  PO Take 1 tablet by mouth in the morning and at bedtime.   ferrous sulfate  325 (65 FE) MG tablet Take 1 tablet (325 mg total) by mouth 2 (two) times daily with a meal.   finasteride  (PROSCAR ) 5 MG tablet Take 5 mg by mouth daily.   folic acid  (FOLVITE ) 1 MG tablet TAKE 1 TABLET BY MOUTH EVERY DAY   gabapentin  (NEURONTIN ) 100 MG capsule TAKE 1 CAPSULE (100 MG TOTAL) BY MOUTH THREE TIMES DAILY.   magic mouthwash SOLN Take 15 mLs by mouth 4 (four) times daily as needed for mouth pain (sore throat). Suspension contains equal  amounts of Maalox Extra Strength, nystatin , and diphenhydramine . (Patient not taking: Reported on 06/09/2024)   mirtazapine  (REMERON ) 30 MG tablet Take 30 mg by mouth at bedtime.   pravastatin  (PRAVACHOL ) 20 MG tablet Take 20 mg by mouth at bedtime.   primidone  (MYSOLINE ) 50 MG tablet TAKE 1 TABLET BY MOUTH THREE TIMES A DAY   tamsulosin  (FLOMAX ) 0.4 MG CAPS capsule Take 0.4 mg by mouth in the morning and at bedtime.  venlafaxine  XR (EFFEXOR -XR) 150 MG 24 hr capsule Take 150 mg by mouth daily with breakfast.   No current facility-administered medications on file prior to visit.  There are no discontinued medications.   Physical Exam:    06/16/2024   11:04 AM 06/10/2024    4:25 AM 06/09/2024    9:43 PM  Vitals with BMI  Height 6' 1    Weight 216 lbs    BMI 28.5    Systolic 110 140 846  Diastolic 66 90 82  Pulse 78 96 91  Vital signs reviewed.  Nursing notes reviewed. Weight trend reviewed. Physical Activity: Sufficiently Active (10/10/2023)   Exercise Vital Sign    Days of Exercise per Week: 5 days    Minutes of Exercise per Session: 30 min   General Appearance:  No acute distress appreciable.   Well-groomed, healthy-appearing male.  Well proportioned with no abnormal fat distribution.  Good muscle tone. Pulmonary:  Normal work of breathing at rest, no respiratory distress apparent. SpO2: 98 %  Musculoskeletal: All extremities are intact.  Neurological:  Awake, alert, oriented, and engaged.  No obvious focal neurological deficits or cognitive impairments.  Sensorium seems unclouded.   Speech is clear and coherent with logical content. Psychiatric:  Appropriate mood, pleasant and cooperative demeanor, thoughtful and engaged during the exam   Verbalized to patient: Physical Exam MEASUREMENTS: Weight- 216.   Results:  Verbalized to patient: Results Labs K (06/15/2024): Mildly elevated Cr (06/15/2024): Improved compared to prior, near baseline, not in acute kidney injury range Ca  (06/15/2024): Normalized CBC (06/15/2024): Low    Lab Results  Component Value Date/Time   CALCIUM  8.6 06/15/2024 09:53 AM   CALCIUM  8.5 (L) 06/10/2024 05:26 AM   CALCIUM  9.0 06/09/2024 09:47 AM   CALCIUM  9.3 05/22/2024 12:53 PM   CALCIUM  9.1 05/13/2024 08:30 AM   CALCIUM  8.2 (L) 03/19/2024 11:51 AM   CALCIUM  8.1 (L) 03/18/2024 05:17 AM   CALCIUM  8.0 (L) 03/17/2024 04:21 AM   CALCIUM  8.4 (L) 03/16/2024 07:27 AM   CALCIUM  8.0 (L) 03/15/2024 05:24 AM    Lab Results  Component Value Date/Time   VD25OH 33.94 02/08/2024 05:39 AM   VD25OH 36.65 08/14/2023 01:30 PM   Lab Results  Component Value Date   CREATININE 1.88 (H) 06/15/2024   CREATININE 2.01 (H) 06/10/2024   CREATININE 2.00 (H) 06/09/2024   CREATININE 1.59 (H) 05/22/2024   CREATININE 1.49 (H) 05/13/2024   CREATININE 1.54 (H) 03/19/2024   CREATININE 1.61 (H) 03/18/2024   CREATININE 1.63 (H) 03/17/2024   CREATININE 1.87 (H) 03/16/2024   CREATININE 1.82 (H) 03/15/2024   CREATININE 1.95 (H) 03/14/2024   CREATININE 2.01 (H) 03/13/2024   CREATININE 2.16 (H) 03/12/2024   CREATININE 2.40 (H) 03/11/2024   CREATININE 2.61 (H) 03/10/2024   CREATININE 2.98 (H) 03/09/2024   CREATININE 4.27 (H) 03/08/2024   CREATININE 5.52 (H) 03/07/2024   CREATININE 6.30 (H) 03/06/2024   CREATININE 5.49 (H) 03/06/2024   CREATININE 1.49 (H) 02/09/2024   CREATININE 1.63 (H) 02/08/2024   CREATININE 2.22 (H) 02/07/2024   CREATININE 3.13 (H) 02/06/2024   CREATININE 3.00 (H) 02/05/2024   CREATININE 3.16 (H) 02/05/2024   CREATININE 1.44 (H) 01/13/2024   CREATININE 1.27 (H) 01/12/2024   CREATININE 1.51 (H) 01/11/2024   CREATININE 1.66 (H) 01/10/2024   CREATININE 1.54 (H) 01/09/2024   CREATININE 1.41 (H) 01/01/2024   CREATININE 1.77 (H) 12/27/2023   CREATININE 1.94 (H) 12/18/2023   CREATININE 1.89 (H) 12/17/2023   CREATININE 1.85 (  H) 12/16/2023   CREATININE 1.99 (H) 12/15/2023   CREATININE 2.41 (H) 12/14/2023   CREATININE 2.75 (H)  12/13/2023   CREATININE 2.48 (H) 12/12/2023   CREATININE 2.50 (H) 12/11/2023   CREATININE 2.40 (H) 12/11/2023   CREATININE 1.89 (H) 12/05/2023   CREATININE 1.86 (H) 12/04/2023   CREATININE 1.81 (H) 12/03/2023   CREATININE 1.88 (H) 12/02/2023   CREATININE 1.78 (H) 11/30/2023   CREATININE 1.89 (H) 11/27/2023   CREATININE 1.60 (H) 11/01/2023   CREATININE 1.52 (H) 10/25/2023        12/09/2023    2:29 PM 10/10/2023   11:58 AM 09/16/2023    2:10 PM 03/07/2023    1:35 PM  PHQ 2/9 Scores  PHQ - 2 Score 0 0 2 1  PHQ- 9 Score  0  7  4      Data saved with a previous flowsheet row definition    Lab on 06/15/2024  Component Date Value Ref Range Status   Sodium 06/15/2024 139  135 - 145 mEq/L Final   Potassium 06/15/2024 5.2 No hemolysis seen (H)  3.5 - 5.1 mEq/L Final   Chloride 06/15/2024 109  96 - 112 mEq/L Final   CO2 06/15/2024 23  19 - 32 mEq/L Final   Glucose, Bld 06/15/2024 88  70 - 99 mg/dL Final   BUN 98/87/7973 29 (H)  6 - 23 mg/dL Final   Creatinine, Ser 06/15/2024 1.88 (H)  0.40 - 1.50 mg/dL Final   GFR 98/87/7973 34.50 (L)  >60.00 mL/min Final   Calcium  06/15/2024 8.6  8.4 - 10.5 mg/dL Final   WBC 98/87/7973 6.3  4.0 - 10.5 K/uL Final   RBC 06/15/2024 3.01 (L)  4.22 - 5.81 Mil/uL Final   Hemoglobin 06/15/2024 8.6 (L)  13.0 - 17.0 g/dL Corrected   HCT 98/87/7973 26.5 (L)  39.0 - 52.0 % Corrected   MCV 06/15/2024 87.7  78.0 - 100.0 fl Final   MCHC 06/15/2024 32.6  30.0 - 36.0 g/dL Final   RDW 98/87/7973 15.6 (H)  11.5 - 15.5 % Final   Platelets 06/15/2024 259.0  150.0 - 400.0 K/uL Final   Neutrophils Relative % 06/15/2024 61.4  43.0 - 77.0 % Final   Lymphocytes Relative 06/15/2024 26.6  12.0 - 46.0 % Final   Monocytes Relative 06/15/2024 6.2  3.0 - 12.0 % Final   Eosinophils Relative 06/15/2024 2.9  0.0 - 5.0 % Final   Basophils Relative 06/15/2024 2.9  0.0 - 3.0 % Final   Neutro Abs 06/15/2024 3.9  1.4 - 7.7 K/uL Final   Lymphs Abs 06/15/2024 1.7  0.7 - 4.0 K/uL Final    Monocytes Absolute 06/15/2024 0.4  0.1 - 1.0 K/uL Final   Eosinophils Absolute 06/15/2024 0.2  0.0 - 0.7 K/uL Final   Basophils Absolute 06/15/2024 0.2 (H)  0.0 - 0.1 K/uL Final  Admission on 06/09/2024, Discharged on 06/10/2024  Component Date Value Ref Range Status   WBC 06/09/2024 12.9 (H)  4.0 - 10.5 K/uL Final   RBC 06/09/2024 3.01 (L)  4.22 - 5.81 MIL/uL Final   Hemoglobin 06/09/2024 8.8 (L)  13.0 - 17.0 g/dL Final   HCT 98/93/7973 27.9 (L)  39.0 - 52.0 % Final   MCV 06/09/2024 92.7  80.0 - 100.0 fL Final   MCH 06/09/2024 29.2  26.0 - 34.0 pg Final   MCHC 06/09/2024 31.5  30.0 - 36.0 g/dL Final   RDW 98/93/7973 15.1  11.5 - 15.5 % Final   Platelets 06/09/2024 233  150 -  400 K/uL Final   nRBC 06/09/2024 0.0  0.0 - 0.2 % Final   Sodium 06/09/2024 136  135 - 145 mmol/L Final   Potassium 06/09/2024 5.1  3.5 - 5.1 mmol/L Final   Chloride 06/09/2024 104  98 - 111 mmol/L Final   CO2 06/09/2024 21 (L)  22 - 32 mmol/L Final   Glucose, Bld 06/09/2024 107 (H)  70 - 99 mg/dL Final   BUN 98/93/7973 40 (H)  8 - 23 mg/dL Final   Creatinine, Ser 06/09/2024 2.00 (H)  0.61 - 1.24 mg/dL Final   Calcium  06/09/2024 9.0  8.9 - 10.3 mg/dL Final   Total Protein 98/93/7973 7.3  6.5 - 8.1 g/dL Final   Albumin  06/09/2024 3.9  3.5 - 5.0 g/dL Final   AST 98/93/7973 16  15 - 41 U/L Final   ALT 06/09/2024 5  0 - 44 U/L Final   Alkaline Phosphatase 06/09/2024 97  38 - 126 U/L Final   Total Bilirubin 06/09/2024 0.2  0.0 - 1.2 mg/dL Final   GFR, Estimated 06/09/2024 34 (L)  >60 mL/min Final   Anion gap 06/09/2024 11  5 - 15 Final   ABO/RH(D) 06/09/2024 O POS   Final   Antibody Screen 06/09/2024 NEG   Final   Sample Expiration 06/09/2024    Final                   Value:06/12/2024,2359 Performed at [facility], [address] [place], [place] [address]    Prothrombin Time 06/09/2024 14.4  11.4 - 15.2 seconds Final   INR 06/09/2024 1.1  0.8 - 1.2 Final   Sodium 06/10/2024 139  135 -  145 mmol/L Final   Potassium 06/10/2024 4.5  3.5 - 5.1 mmol/L Final   Chloride 06/10/2024 107  98 - 111 mmol/L Final   CO2 06/10/2024 22  22 - 32 mmol/L Final   Glucose, Bld 06/10/2024 108 (H)  70 - 99 mg/dL Final   BUN 98/92/7973 33 (H)  8 - 23 mg/dL Final   Creatinine, Ser 06/10/2024 2.01 (H)  0.61 - 1.24 mg/dL Final   Calcium  06/10/2024 8.5 (L)  8.9 - 10.3 mg/dL Final   GFR, Estimated 06/10/2024 34 (L)  >60 mL/min Final   Anion gap 06/10/2024 10  5 - 15 Final   WBC 06/10/2024 9.0  4.0 - 10.5 K/uL Final   RBC 06/10/2024 2.80 (L)  4.22 - 5.81 MIL/uL Final   Hemoglobin 06/10/2024 7.9 (L)  13.0 - 17.0 g/dL Final   HCT 98/92/7973 26.0 (L)  39.0 - 52.0 % Final   MCV 06/10/2024 92.9  80.0 - 100.0 fL Final   MCH 06/10/2024 28.2  26.0 - 34.0 pg Final   MCHC 06/10/2024 30.4  30.0 - 36.0 g/dL Final   RDW 98/92/7973 15.0  11.5 - 15.5 % Final   Platelets 06/10/2024 184  150 - 400 K/uL Final   nRBC 06/10/2024 0.0  0.0 - 0.2 % Final  Admission on 05/25/2024, Discharged on 05/25/2024  Component Date Value Ref Range Status   SURGICAL PATHOLOGY 05/25/2024    Final-Edited                   Value:SURGICAL PATHOLOGY CASE: [id number] PATIENT: NORLEEN BRIER Surgical Pathology Report     Clinical History: Benign prostatic hyperplasia (las)     FINAL MICROSCOPIC DIAGNOSIS:  A. PROSTATE CHIPS, TURP: Benign prostate tissue.   GROSS DESCRIPTION: Received in formalin are variably sized, irregular rubbery gray  to tan tissue fragments that weigh 44 grams. Block Summary: Representative sections are submitted in 8 cassettes. (WC 05/25/2024)   Final Diagnosis performed by Norleen Dover, MD.   Electronically signed 05/26/2024 Technical component performed at [facility] [address], [place], [place] [address].  Professional component performed at [employer]. [facility] [address] [place], [place] [address].  Immunohistochemistry Technical component (if  applicable) was performed at [facility]. [address], [address], [place], [place] [address].   IMMUNOHISTOCHEMISTRY DISCLAIMER (if applicable): Some of                          these immunohistochemical stains may have been developed and the performance characteristics determine by [facility]. Some may not have been cleared or approved by the U.S. Food and Drug Administration. The FDA has determined that such clearance or approval is not necessary. This test is used for clinical purposes. It should not be regarded as investigational or for research. This laboratory is certified under the Clinical Laboratory Improvement Amendments of 1988 (CLIA-88) as qualified to perform high complexity clinical laboratory testing.  The controls stained appropriately.   IHC stains are performed on formalin fixed, paraffin embedded tissue using a 3,3diaminobenzidine (DAB) chromogen and Leica Bond Autostainer System. The staining intensity of the nucleus is score manually and is reported as the percentage of tumor cell nuclei demonstrating specific nuclear staining. The specimens are fixed in 10% Neutral Formalin for at least 6 hours and up to 72hr                         s. These tests are validated on decalcified tissue. Results should be interpreted with caution given the possibility of false negative results on decalcified specimens. Antibody Clones are as follows ER-clone 51F, PR-clone 16, Ki67- clone MM1. Some of these immunohistochemical stains may have been developed and the performance characteristics determined by [facility].   Admission on 05/22/2024, Discharged on 05/22/2024  Component Date Value Ref Range Status   WBC 05/22/2024 5.3  4.0 - 10.5 K/uL Final   RBC 05/22/2024 3.44 (L)  4.22 - 5.81 MIL/uL Final   Hemoglobin 05/22/2024 9.8 (L)  13.0 - 17.0 g/dL Final   HCT 87/80/7974 33.0 (L)  39.0 - 52.0 % Final   MCV 05/22/2024 95.9  80.0 - 100.0 fL  Final   MCH 05/22/2024 28.5  26.0 - 34.0 pg Final   MCHC 05/22/2024 29.7 (L)  30.0 - 36.0 g/dL Final   RDW 87/80/7974 16.2 (H)  11.5 - 15.5 % Final   Platelets 05/22/2024 196  150 - 400 K/uL Final   nRBC 05/22/2024 0.0  0.0 - 0.2 % Final   Sodium 05/22/2024 139  135 - 145 mmol/L Final   Potassium 05/22/2024 5.1  3.5 - 5.1 mmol/L Final   Chloride 05/22/2024 105  98 - 111 mmol/L Final   CO2 05/22/2024 24  22 - 32 mmol/L Final   Glucose, Bld 05/22/2024 98  70 - 99 mg/dL Final   BUN 87/80/7974 36 (H)  8 - 23 mg/dL Final   Creatinine, Ser 05/22/2024 1.59 (H)  0.61 - 1.24 mg/dL Final   Calcium  05/22/2024 9.3  8.9 - 10.3 mg/dL Final   GFR, Estimated 05/22/2024 45 (L)  >60 mL/min Final   Anion gap 05/22/2024 10  5 - 15 Final  Hospital Outpatient Visit on 05/13/2024  Component Date  Value Ref Range Status   Sodium 05/13/2024 142  135 - 145 mmol/L Final   Potassium 05/13/2024 4.5  3.5 - 5.1 mmol/L Final   Chloride 05/13/2024 107  98 - 111 mmol/L Final   CO2 05/13/2024 24  22 - 32 mmol/L Final   Glucose, Bld 05/13/2024 83  70 - 99 mg/dL Final   BUN 87/89/7974 34 (H)  8 - 23 mg/dL Final   Creatinine, Ser 05/13/2024 1.49 (H)  0.61 - 1.24 mg/dL Final   Calcium  05/13/2024 9.1  8.9 - 10.3 mg/dL Final   GFR, Estimated 05/13/2024 49 (L)  >60 mL/min Final   Anion gap 05/13/2024 10  5 - 15 Final   WBC 05/13/2024 6.2  4.0 - 10.5 K/uL Final   RBC 05/13/2024 3.20 (L)  4.22 - 5.81 MIL/uL Final   Hemoglobin 05/13/2024 9.0 (L)  13.0 - 17.0 g/dL Final   HCT 87/89/7974 30.5 (L)  39.0 - 52.0 % Final   MCV 05/13/2024 95.3  80.0 - 100.0 fL Final   MCH 05/13/2024 28.1  26.0 - 34.0 pg Final   MCHC 05/13/2024 29.5 (L)  30.0 - 36.0 g/dL Final   RDW 87/89/7974 16.9 (H)  11.5 - 15.5 % Final   Platelets 05/13/2024 190  150 - 400 K/uL Final   nRBC 05/13/2024 0.0  0.0 - 0.2 % Final  No results displayed because visit has over 200 results.    No results displayed because visit has over 200 results.    Admission on  01/09/2024, Discharged on 01/14/2024  Component Date Value Ref Range Status   SURGICAL PATHOLOGY 01/09/2024    Final-Edited                   Value:SURGICAL PATHOLOGY CASE: [id number] PATIENT: NORLEEN BRIER Surgical Pathology Report     Clinical History: COLOVESICAL FISTULA (kc)     FINAL MICROSCOPIC DIAGNOSIS:  A. COLON, RECTOSIGMOID, RESECTION:      Segment of colon with diverticulosis.      Mesentery with fibroblastic reaction and serosal adhesion.      Eleven lymph nodes, negative for metastatic carcinoma (0/11).      Negative for dysplasia or malignancy.  B. DISTAL MARGIN, RESECTION:      Segment of colon without significant diagnostic alterations.      Negative for dysplasia or malignancy.   GROSS DESCRIPTION:  A. Received fresh and subsequently placed in formalin labeled with the patient's name and Rectosigmoid colon is a 19.2 cm segment of colon with an average circumference of 4.6 cm an up to 4.3 cm of attached mesocolon. Tan to red-brown, ragged adhesions carpet the serosa.      `Opening reveals a multitude of diverticular outpouchings that extend through the wall and intermittently ass                         ociated with transmural defects. A true fistula is not grossly identified.      In the attached fibrofatty tissue are multiple tan, rubbery possible lymph nodes ranging from 0.3-0.5 cm in greatest dimension. Block Summary A1: proximal (green) and distal (blue) resection margins, en face A2-A4: diverticula and possible transmural defects A5: lymph nodes  B. Received fresh and subsequently placed in formalin labeled with the patient's name and Final distal margin is a 0.6 cm long, 2.5 cm in diameter ring-shaped piece of tan-brown, unremarkable mucosa. Representative sections are submitted in cassette B1.  (LEF 01/10/2024)  Final Diagnosis performed  by Pepper Dutton, MD.   Electronically signed 01/13/2024 Technical component performed at [facility], [address], [place], [place] [address].  Professional component performed at [employer]. [facility], [address], [place], [place] [address].  Immunohistochemistry Technical component (if a                         pplicable) was performed at [facility]. [address], [address], [place], [place] [address].   IMMUNOHISTOCHEMISTRY DISCLAIMER (if applicable): Some of these immunohistochemical stains may have been developed and the performance characteristics determine by [facility]. Some may not have been cleared or approved by the U.S. Food and Drug Administration. The FDA has determined that such clearance or approval is not necessary. This test is used for clinical purposes. It should not be regarded as investigational or for research. This laboratory is certified under the Clinical Laboratory Improvement Amendments of 1988 (CLIA-88) as qualified to perform high complexity clinical laboratory testing.  The controls stained appropriately.   IHC stains are performed on formalin fixed, paraffin embedded tissue using a 3,3diaminobenzidine (DAB) chromogen and Leica Bond Autostainer System. The staining intensity of the nucleus is score manually and is r                         eported as the percentage of tumor cell nuclei demonstrating specific nuclear staining. The specimens are fixed in 10% Neutral Formalin for at least 6 hours and up to 72hrs. These tests are validated on decalcified tissue. Results should be interpreted with caution given the possibility of false negative results on decalcified specimens. Antibody Clones are as follows ER-clone [id number], PR-clone 16, Ki67- clone MM1. Some of these immunohistochemical stains may have been developed and the performance characteristics determined by [facility].    Glucose-Capillary 01/09/2024 178 (H)  70 - 99 mg/dL Final   WBC 91/92/7974 13.9 (H)  4.0 - 10.5 K/uL  Final   RBC 01/09/2024 3.30 (L)  4.22 - 5.81 MIL/uL Final   Hemoglobin 01/09/2024 9.0 (L)  13.0 - 17.0 g/dL Final   HCT 91/92/7974 30.2 (L)  39.0 - 52.0 % Final   MCV 01/09/2024 91.5  80.0 - 100.0 fL Final   MCH 01/09/2024 27.3  26.0 - 34.0 pg Final   MCHC 01/09/2024 29.8 (L)  30.0 - 36.0 g/dL Final   RDW 91/92/7974 14.6  11.5 - 15.5 % Final   Platelets 01/09/2024 182  150 - 400 K/uL Final   nRBC 01/09/2024 0.0  0.0 - 0.2 % Final   Sodium 01/09/2024 135  135 - 145 mmol/L Final   Potassium 01/09/2024 4.1  3.5 - 5.1 mmol/L Final   Chloride 01/09/2024 104  98 - 111 mmol/L Final   CO2 01/09/2024 20 (L)  22 - 32 mmol/L Final   Glucose, Bld 01/09/2024 161 (H)  70 - 99 mg/dL Final   BUN 91/92/7974 15  8 - 23 mg/dL Final   Creatinine, Ser 01/09/2024 1.54 (H)  0.61 - 1.24 mg/dL Final   Calcium  01/09/2024 8.2 (L)  8.9 - 10.3 mg/dL Final   Total Protein 91/92/7974 6.6  6.5 - 8.1 g/dL Final   Albumin  01/09/2024 3.1 (L)  3.5 - 5.0 g/dL Final   AST 91/92/7974 18  15 - 41 U/L Final   ALT 01/09/2024 10  0 - 44 U/L Final   Alkaline Phosphatase 01/09/2024 73  38 -  126 U/L Final   Total Bilirubin 01/09/2024 0.4  0.0 - 1.2 mg/dL Final   GFR, Estimated 01/09/2024 47 (L)  >60 mL/min Final   Anion gap 01/09/2024 11  5 - 15 Final   Magnesium  01/09/2024 1.8  1.7 - 2.4 mg/dL Final   Phosphorus 91/92/7974 3.5  2.5 - 4.6 mg/dL Final   MRSA by PCR Next Gen 01/09/2024 NOT DETECTED  NOT DETECTED Final   Sodium 01/10/2024 134 (L)  135 - 145 mmol/L Final   Potassium 01/10/2024 4.4  3.5 - 5.1 mmol/L Final   Chloride 01/10/2024 104  98 - 111 mmol/L Final   CO2 01/10/2024 22  22 - 32 mmol/L Final   Glucose, Bld 01/10/2024 126 (H)  70 - 99 mg/dL Final   BUN 91/91/7974 16  8 - 23 mg/dL Final   Creatinine, Ser 01/10/2024 1.66 (H)  0.61 - 1.24 mg/dL Final   Calcium  01/10/2024 7.4 (L)  8.9 - 10.3 mg/dL Final   GFR, Estimated 01/10/2024 43 (L)  >60 mL/min Final   Anion gap 01/10/2024 8  5 - 15 Final   WBC 01/10/2024  9.1  4.0 - 10.5 K/uL Final   RBC 01/10/2024 2.70 (L)  4.22 - 5.81 MIL/uL Final   Hemoglobin 01/10/2024 7.5 (L)  13.0 - 17.0 g/dL Final   HCT 91/91/7974 25.4 (L)  39.0 - 52.0 % Final   MCV 01/10/2024 94.1  80.0 - 100.0 fL Final   MCH 01/10/2024 27.8  26.0 - 34.0 pg Final   MCHC 01/10/2024 29.5 (L)  30.0 - 36.0 g/dL Final   RDW 91/91/7974 14.8  11.5 - 15.5 % Final   Platelets 01/10/2024 148 (L)  150 - 400 K/uL Final   nRBC 01/10/2024 0.0  0.0 - 0.2 % Final   Magnesium  01/10/2024 1.6 (L)  1.7 - 2.4 mg/dL Final   Glucose-Capillary 01/09/2024 157 (H)  70 - 99 mg/dL Final   Glucose-Capillary 01/09/2024 146 (H)  70 - 99 mg/dL Final   WBC 91/91/7974 11.0 (H)  4.0 - 10.5 K/uL Final   RBC 01/10/2024 2.88 (L)  4.22 - 5.81 MIL/uL Final   Hemoglobin 01/10/2024 8.0 (L)  13.0 - 17.0 g/dL Final   HCT 91/91/7974 27.0 (L)  39.0 - 52.0 % Final   MCV 01/10/2024 93.8  80.0 - 100.0 fL Final   MCH 01/10/2024 27.8  26.0 - 34.0 pg Final   MCHC 01/10/2024 29.6 (L)  30.0 - 36.0 g/dL Final   RDW 91/91/7974 14.8  11.5 - 15.5 % Final   Platelets 01/10/2024 157  150 - 400 K/uL Final   nRBC 01/10/2024 0.0  0.0 - 0.2 % Final   Glucose-Capillary 01/10/2024 125 (H)  70 - 99 mg/dL Final   Glucose-Capillary 01/10/2024 119 (H)  70 - 99 mg/dL Final   Glucose-Capillary 01/10/2024 151 (H)  70 - 99 mg/dL Final   Glucose-Capillary 01/10/2024 126 (H)  70 - 99 mg/dL Final   Glucose-Capillary 01/10/2024 125 (H)  70 - 99 mg/dL Final   Glucose-Capillary 01/11/2024 104 (H)  70 - 99 mg/dL Final   WBC 91/90/7974 9.5  4.0 - 10.5 K/uL Final   RBC 01/11/2024 2.69 (L)  4.22 - 5.81 MIL/uL Final   Hemoglobin 01/11/2024 7.3 (L)  13.0 - 17.0 g/dL Final   HCT 91/90/7974 25.6 (L)  39.0 - 52.0 % Final   MCV 01/11/2024 95.2  80.0 - 100.0 fL Final   MCH 01/11/2024 27.1  26.0 - 34.0 pg Final   MCHC  01/11/2024 28.5 (L)  30.0 - 36.0 g/dL Final   RDW 91/90/7974 14.8  11.5 - 15.5 % Final   Platelets 01/11/2024 167  150 - 400 K/uL Final   nRBC  01/11/2024 0.0  0.0 - 0.2 % Final   Creatinine, Ser 01/11/2024 1.51 (H)  0.61 - 1.24 mg/dL Final   GFR, Estimated 01/11/2024 48 (L)  >60 mL/min Final   Potassium 01/11/2024 4.3  3.5 - 5.1 mmol/L Final   Glucose-Capillary 01/11/2024 142 (H)  70 - 99 mg/dL Final   WBC 91/89/7974 8.1  4.0 - 10.5 K/uL Final   RBC 01/12/2024 2.70 (L)  4.22 - 5.81 MIL/uL Final   Hemoglobin 01/12/2024 7.5 (L)  13.0 - 17.0 g/dL Final   HCT 91/89/7974 25.2 (L)  39.0 - 52.0 % Final   MCV 01/12/2024 93.3  80.0 - 100.0 fL Final   MCH 01/12/2024 27.8  26.0 - 34.0 pg Final   MCHC 01/12/2024 29.8 (L)  30.0 - 36.0 g/dL Final   RDW 91/89/7974 14.8  11.5 - 15.5 % Final   Platelets 01/12/2024 195  150 - 400 K/uL Final   nRBC 01/12/2024 0.0  0.0 - 0.2 % Final   Glucose-Capillary 01/11/2024 143 (H)  70 - 99 mg/dL Final   Glucose-Capillary 01/11/2024 121 (H)  70 - 99 mg/dL Final   Glucose-Capillary 01/12/2024 99  70 - 99 mg/dL Final   Glucose-Capillary 01/12/2024 96  70 - 99 mg/dL Final   Creatinine, Ser 01/12/2024 1.27 (H)  0.61 - 1.24 mg/dL Final   GFR, Estimated 01/12/2024 59 (L)  >60 mL/min Final   Potassium 01/12/2024 4.2  3.5 - 5.1 mmol/L Final   Glucose-Capillary 01/12/2024 105 (H)  70 - 99 mg/dL Final   Glucose-Capillary 01/12/2024 125 (H)  70 - 99 mg/dL Final   WBC 91/88/7974 8.8  4.0 - 10.5 K/uL Final   RBC 01/13/2024 2.66 (L)  4.22 - 5.81 MIL/uL Final   Hemoglobin 01/13/2024 7.2 (L)  13.0 - 17.0 g/dL Final   HCT 91/88/7974 24.9 (L)  39.0 - 52.0 % Final   MCV 01/13/2024 93.6  80.0 - 100.0 fL Final   MCH 01/13/2024 27.1  26.0 - 34.0 pg Final   MCHC 01/13/2024 28.9 (L)  30.0 - 36.0 g/dL Final   RDW 91/88/7974 14.9  11.5 - 15.5 % Final   Platelets 01/13/2024 250  150 - 400 K/uL Final   nRBC 01/13/2024 0.0  0.0 - 0.2 % Final   Creatinine, Ser 01/13/2024 1.44 (H)  0.61 - 1.24 mg/dL Final   GFR, Estimated 01/13/2024 51 (L)  >60 mL/min Final   Potassium 01/13/2024 4.1  3.5 - 5.1 mmol/L Final   Glucose-Capillary  01/12/2024 120 (H)  70 - 99 mg/dL Final   Glucose-Capillary 01/13/2024 107 (H)  70 - 99 mg/dL Final   Glucose-Capillary 01/13/2024 133 (H)  70 - 99 mg/dL Final   Glucose-Capillary 01/13/2024 178 (H)  70 - 99 mg/dL Final   Glucose-Capillary 01/13/2024 109 (H)  70 - 99 mg/dL Final   Glucose-Capillary 01/14/2024 98  70 - 99 mg/dL Final   Glucose-Capillary 01/14/2024 134 (H)  70 - 99 mg/dL Final  Hospital Outpatient Visit on 01/01/2024  Component Date Value Ref Range Status   Sodium 01/01/2024 139  135 - 145 mmol/L Final   Potassium 01/01/2024 4.7  3.5 - 5.1 mmol/L Final   Chloride 01/01/2024 102  98 - 111 mmol/L Final   CO2 01/01/2024 23  22 - 32 mmol/L Final  Glucose, Bld 01/01/2024 102 (H)  70 - 99 mg/dL Final   BUN 92/69/7974 21  8 - 23 mg/dL Final   Creatinine, Ser 01/01/2024 1.41 (H)  0.61 - 1.24 mg/dL Final   Calcium  01/01/2024 9.1  8.9 - 10.3 mg/dL Final   GFR, Estimated 01/01/2024 52 (L)  >60 mL/min Final   Anion gap 01/01/2024 14  5 - 15 Final   WBC 01/01/2024 6.7  4.0 - 10.5 K/uL Final   RBC 01/01/2024 3.44 (L)  4.22 - 5.81 MIL/uL Final   Hemoglobin 01/01/2024 9.4 (L)  13.0 - 17.0 g/dL Final   HCT 92/69/7974 31.7 (L)  39.0 - 52.0 % Final   MCV 01/01/2024 92.2  80.0 - 100.0 fL Final   MCH 01/01/2024 27.3  26.0 - 34.0 pg Final   MCHC 01/01/2024 29.7 (L)  30.0 - 36.0 g/dL Final   RDW 92/69/7974 14.2  11.5 - 15.5 % Final   Platelets 01/01/2024 227  150 - 400 K/uL Final   nRBC 01/01/2024 0.0  0.0 - 0.2 % Final   ABO/RH(D) 01/01/2024 O POS   Final   Antibody Screen 01/01/2024 NEG   Final   Sample Expiration 01/01/2024 01/12/2024,2359   Final   Extend sample reason 01/01/2024    Final                   Value:NO TRANSFUSIONS OR PREGNANCY IN THE PAST 3 MONTHS Performed at [facility], [address], [place], [place] [address]    Hgb A1c MFr Bld 01/01/2024 5.0  4.8 - 5.6 % Final   Mean Plasma Glucose 01/01/2024 96.8  mg/dL Final  Office Visit on 12/27/2023   Component Date Value Ref Range Status   Sodium 12/27/2023 141  135 - 145 mEq/L Final   Potassium 12/27/2023 4.0  3.5 - 5.1 mEq/L Final   Chloride 12/27/2023 105  96 - 112 mEq/L Final   CO2 12/27/2023 27  19 - 32 mEq/L Final   Glucose, Bld 12/27/2023 108 (H)  70 - 99 mg/dL Final   BUN 92/74/7974 21  6 - 23 mg/dL Final   Creatinine, Ser 12/27/2023 1.77 (H)  0.40 - 1.50 mg/dL Final   Total Bilirubin 12/27/2023 0.4  0.2 - 1.2 mg/dL Final   Alkaline Phosphatase 12/27/2023 56  39 - 117 U/L Final   AST 12/27/2023 15  0 - 37 U/L Final   ALT 12/27/2023 8  0 - 53 U/L Final   Total Protein 12/27/2023 6.6  6.0 - 8.3 g/dL Final   Albumin  12/27/2023 3.4 (L)  3.5 - 5.2 g/dL Final   GFR 92/74/7974 37.21 (L)  >60.00 mL/min Final   Calcium  12/27/2023 8.6  8.4 - 10.5 mg/dL Final   WBC 92/74/7974 6.2  4.0 - 10.5 K/uL Final   RBC 12/27/2023 2.92 (L)  4.22 - 5.81 Mil/uL Final   Hemoglobin 12/27/2023 8.2 Repeated and verified X2. (L)  13.0 - 17.0 g/dL Final   HCT 92/74/7974 25.7 (L)  39.0 - 52.0 % Final   MCV 12/27/2023 87.9  78.0 - 100.0 fl Final   MCHC 12/27/2023 32.0  30.0 - 36.0 g/dL Final   RDW 92/74/7974 14.4  11.5 - 15.5 % Final   Platelets 12/27/2023 179.0  150.0 - 400.0 K/uL Final   Neutrophils Relative % 12/27/2023 61.2  43.0 - 77.0 % Final   Lymphocytes Relative 12/27/2023 22.8  12.0 - 46.0 % Final   Monocytes Relative 12/27/2023 10.0  3.0 - 12.0 %  Final   Eosinophils Relative 12/27/2023 4.7  0.0 - 5.0 % Final   Basophils Relative 12/27/2023 1.3  0.0 - 3.0 % Final   Neutro Abs 12/27/2023 3.8  1.4 - 7.7 K/uL Final   Lymphs Abs 12/27/2023 1.4  0.7 - 4.0 K/uL Final   Monocytes Absolute 12/27/2023 0.6  0.1 - 1.0 K/uL Final   Eosinophils Absolute 12/27/2023 0.3  0.0 - 0.7 K/uL Final   Basophils Absolute 12/27/2023 0.1  0.0 - 0.1 K/uL Final   Color, Urine 12/27/2023 YELLOW  YELLOW Final   APPearance 12/27/2023 CLEAR  CLEAR Final   Specific Gravity, Urine 12/27/2023 1.012  1.001 - 1.035 Final    pH 12/27/2023 7.5  5.0 - 8.0 Final   Glucose, UA 12/27/2023 NEGATIVE  NEGATIVE Final   Bilirubin Urine 12/27/2023 NEGATIVE  NEGATIVE Final   Ketones, ur 12/27/2023 NEGATIVE  NEGATIVE Final   Hgb urine dipstick 12/27/2023 3+ (A)  NEGATIVE Final   Protein, ur 12/27/2023 3+ (A)  NEGATIVE Final   Nitrites, Initial 12/27/2023 NEGATIVE  NEGATIVE Final   Leukocyte Esterase 12/27/2023 1+ (A)  NEGATIVE Final   WBC, UA 12/27/2023 10-20 (A)  0 - 5 /HPF Final   RBC / HPF 12/27/2023 PACKED (A)  0 - 2 /HPF Final   Squamous Epithelial / HPF 12/27/2023 NONE SEEN  < OR = 5 /HPF Final   Bacteria, UA 12/27/2023 FEW (A)  NONE SEEN /HPF Final   Hyaline Cast 12/27/2023 0-5 (A)  NONE SEEN /LPF Final   Note 12/27/2023    Final   MICRO NUMBER: 12/27/2023 [id number]   Final   SPECIMEN QUALITY: 12/27/2023 Adequate   Final   Sample Source 12/27/2023 URINE   Final   STATUS: 12/27/2023 FINAL   Final   ISOLATE 1: 12/27/2023 Enterococcus faecalis (A)   Final   REFLEXIVE URINE CULTURE 12/27/2023    Final  There may be more visits with results that are not included.  No image results found. DG Elbow Complete Left Result Date: 05/22/2024 EXAM: 3 VIEW(S) XRAY OF THE LEFT ELBOW COMPARISON: None available. CLINICAL HISTORY: fall FINDINGS: BONES AND JOINTS: Nondisplaced intra-articular radial head fracture. Small elbow effusion. No malalignment. SOFT TISSUES: The soft tissues are unremarkable. IMPRESSION: 1. Nondisplaced intra-articular radial head fracture. 2. Small elbow effusion. Electronically signed by: Greig Pique MD 05/22/2024 03:13 PM EST RP Workstation: [id number]   CT Cervical Spine Wo Contrast Result Date: 05/22/2024 EXAM: CT CERVICAL SPINE WITHOUT CONTRAST 05/22/2024 02:41:24 PM TECHNIQUE: CT of the cervical spine was performed without the administration of intravenous contrast. Multiplanar reformatted images are provided for review. Automated exposure control, iterative reconstruction, and/or weight based  adjustment of the mA/kV was utilized to reduce the radiation dose to as low as reasonably achievable. COMPARISON: CT cervical spine 06/02/2023. CLINICAL HISTORY: Neck trauma (Age >= 65y). Multiple recent falls. FINDINGS: BONES AND ALIGNMENT: Mild chronic reversal of the normal cervical lordosis with unchanged slight anterolisthesis of C4 on C5. No acute fracture or traumatic malalignment. Unchanged nonspecific 9 mm lucent focus in the C3 left lamina. Interbody ankylosis at C4-C5 and C5-C6. Facet ankylosis at C4-C5. DEGENERATIVE CHANGES: Advanced upper cervical facet arthrosis. Severe widespread neural foraminal stenosis due to uncovertebral and facet spurring. Suspected moderate spinal stenosis at C5-C6. SOFT TISSUES: No prevertebral soft tissue swelling. IMPRESSION: 1. No acute cervical spine fracture or traumatic malalignment. Electronically signed by: Dasie Hamburg MD 05/22/2024 03:02 PM EST RP Workstation: [id number]   CT Head Wo Contrast Result Date: 05/22/2024 EXAM: CT HEAD  WITHOUT CONTRAST 05/22/2024 02:41:24 PM TECHNIQUE: CT of the head was performed without the administration of intravenous contrast. Automated exposure control, iterative reconstruction, and/or weight based adjustment of the mA/kV was utilized to reduce the radiation dose to as low as reasonably achievable. COMPARISON: head CT 07/17/2023 CLINICAL HISTORY: Head trauma, minor. Multiple recent falls. FINDINGS: BRAIN AND VENTRICLES: There is no evidence of an acute infarct, intracranial hemorrhage, mass, midline shift, hydrocephalus, or extra-axial fluid collection. Generalized cerebral atrophy is mild for age. ORBITS: Bilateral cataract extraction. SINUSES: No acute abnormality. SOFT TISSUES AND SKULL: No acute soft tissue abnormality. No skull fracture. IMPRESSION: 1. No acute intracranial abnormality. Electronically signed by: Dasie Hamburg MD 05/22/2024 02:52 PM EST RP Workstation: [id number]         ASSESSMENT & PLAN   Assessment &  Plan Sleep disturbances Daytime sleepiness affects nighttime sleep, possibly related to frequent nocturia and urinary incontinence. He is encouraged to maintain a regular sleep schedule and reduce nighttime fluid intake. 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 A fistula is suspected due to incontinence during diarrhea, though no bladder spasms are present. A potential UTI is considered. Start Kegel exercises to strengthen pelvic floor muscles. A urine analysis is ordered to check for infection. He is referred to urology for further evaluation. Antibiotic treatment will be considered if a UTI is confirmed. Poor diet Protein-calorie malnutrition, severe Protein-calorie malnutrition   His diet is poor with limited variety, primarily consuming hot dogs, soups, and ice cream. Weight is stable at 216 lbs, but he desires to gain weight. The potential benefits of testosterone therapy for muscle gain and weight increase were discussed. He is encouraged to follow a balanced diet with more protein intake. Protein shakes and creatine supplements are recommended. Regular physical activity is encouraged to promote muscle gain. Testosterone therapy will be considered to aid in muscle gain and weight increase. Other fatigue The importance of regular exercise and maintaining a healthy diet was discussed. He is encouraged to engage in physical activity to improve overall health and muscle tone. Regular exercise, including walking and weightlifting, is encouraged. Protein shakes and creatine supplements are recommended to support muscle gain. He is advised to follow up with an eye examination and an ENT specialist for further evaluation. Worsening vision He reports difficulty with vision, possibly related to eye problems. An eye examination is scheduled. Ensure follow-up with the eye  examination at [facility]. 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 There is no immediate need for dialysis. He is advised to avoid high potassium foods. Kidney function will be monitored with follow-up lab work in 1-4 weeks.    ORDER ASSOCIATIONS  #   DIAGNOSIS / CONDITION ICD-10 ENCOUNTER ORDER     ICD-10-CM   1. Sleep disturbances  G47.9 CBC with Differential/Platelet    Basic Metabolic Panel (BMET)    2. Other urinary incontinence  N39.498 Urinalysis w microscopic + reflex cultur    3. Poor diet  E63.9     4. Other fatigue  R53.83 Testosterone    5. Worsening vision  H54.7 Ambulatory referral to Ophthalmology    6. Anemia of chronic disease  D63.8  7. Protein-calorie malnutrition, severe  E43     8. Chronic kidney disease (CKD), active medical management without dialysis, stage 4 (severe) (HCC)  N18.4           Orders Placed in Encounter:   Lab Orders         CBC with Differential/Platelet         Basic Metabolic Panel (BMET)         Urinalysis w microscopic + reflex cultur         Testosterone    Referral Orders         Ambulatory referral to Ophthalmology      Medical Decision Making: 1 or more chronic illnesses with exacerbation,  progression, or side effects of treatment 2 or more stable chronic illnesses     Ordering of each unique test; 4    This document was synthesized by artificial intelligence (Abridge) using HIPAA-compliant recording of the clinical interaction;   We discussed the use of AI scribe software for clinical note transcription with the patient, who gave verbal consent to proceed. additional Info: This encounter employed state-of-the-art, real-time,  collaborative documentation. The patient actively reviewed and assisted in updating their electronic medical record on a shared screen, ensuring transparency and facilitating joint problem-solving for the problem list, overview, and plan. This approach promotes accurate, informed care. The treatment plan was discussed and reviewed in detail, including medication safety, potential side effects, and all patient questions. We confirmed understanding and comfort with the plan. Follow-up instructions were established, including contacting the office for any concerns, returning if symptoms worsen, persist, or new symptoms develop, and precautions for potential emergency department visits.

## 2024-06-16 NOTE — Assessment & Plan Note (Signed)
 Protein-calorie malnutrition   His diet is poor with limited variety, primarily consuming hot dogs, soups, and ice cream. Weight is stable at 216 lbs, but he desires to gain weight. The potential benefits of testosterone therapy for muscle gain and weight increase were discussed. He is encouraged to follow a balanced diet with more protein intake. Protein shakes and creatine supplements are recommended. Regular physical activity is encouraged to promote muscle gain. Testosterone therapy will be considered to aid in muscle gain and weight increase.

## 2024-06-16 NOTE — Assessment & Plan Note (Signed)
 Chronic kidney disease, stage 3-4   Kidney function has improved but remains at stage 3-4, with slightly elevated potassium levels. There is no immediate need for dialysis. He is advised to avoid high potassium foods. Kidney function will be monitored with follow-up lab work in 1-4 weeks.

## 2024-06-16 NOTE — Assessment & Plan Note (Signed)
 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 A fistula is suspected due to incontinence during diarrhea, though no bladder spasms are present. A potential UTI is considered. Start Kegel exercises to strengthen pelvic floor muscles. A urine analysis is ordered to check for infection. He is referred to urology for further evaluation. Antibiotic treatment will be considered if a UTI is confirmed.

## 2024-06-16 NOTE — Assessment & Plan Note (Signed)
 Daytime sleepiness affects nighttime sleep, possibly related to frequent nocturia and urinary incontinence. He is encouraged to maintain a regular sleep schedule and reduce nighttime fluid intake.

## 2024-06-23 ENCOUNTER — Telehealth: Payer: Self-pay

## 2024-06-23 NOTE — Telephone Encounter (Signed)
 Copied from CRM [id number]. Topic: General - Other >> Jun 23, 2024 10:59 AM Drema MATSU wrote: Reason for CRM: Calvin Escobar stated that she will be re faxing over the plan of care for pt with order  noted

## 2024-06-24 ENCOUNTER — Telehealth: Payer: Self-pay | Admitting: Internal Medicine

## 2024-06-24 NOTE — Telephone Encounter (Signed)
 Spoke with wife stated they urology office was back up not able to get pt in. Home health nurse advised pt to get depends to wear so wife got him some and are going to work on using them for now.

## 2024-06-24 NOTE — Telephone Encounter (Signed)
 Copied from CRM #[id number]. Topic: Clinical - Medical Advice >> Jun 24, 2024 11:23 AM Mercedes MATSU wrote: Reason for CRM: Patients wife call [facility] stating that she wants to know if the patient would need something written, or if she could just request it herself for him to have his catheter put back on. She said he is constantly leaking and peeing all over the place. She is asking for a urgent call back and can be reached at [phone].

## 2024-06-29 ENCOUNTER — Other Ambulatory Visit

## 2024-07-01 ENCOUNTER — Telehealth (INDEPENDENT_AMBULATORY_CARE_PROVIDER_SITE_OTHER): Payer: Self-pay | Admitting: Physician Assistant

## 2024-07-01 ENCOUNTER — Telehealth: Payer: Self-pay

## 2024-07-01 NOTE — Telephone Encounter (Signed)
 Copied from CRM #[id number]. Topic: General - Other >> Jun 30, 2024  4:06 PM Rea ORN wrote: Reason for CRM: Katelyn with [facility] called to follow up on Certification and POC Order # [id number] that was faxed on 06/09/24. She never received the returned fax. Please re-fax to [phone].  Not able to refax it has done went to front office to scan in chart will need new one sent over

## 2024-07-01 NOTE — Telephone Encounter (Signed)
 07/01/24 The referral sent does not indicate a diagnosis(es)for scheduling. Called the providers office and left a message to get a more specific reason with an update on the referral.

## 2024-07-01 NOTE — Telephone Encounter (Signed)
 Found [facility] download in media and printing off and faxing now

## 2024-07-01 NOTE — Telephone Encounter (Signed)
 Copied from CRM [id number]. Topic: Referral - Question >> Jul 01, 2024  1:53 PM Larissa RAMAN wrote: Reason for CRM: Delon with [facility] states a more detailed reason for patient's referral.  Callback # [phone]

## 2024-07-02 ENCOUNTER — Other Ambulatory Visit

## 2024-07-02 NOTE — Addendum Note (Signed)
 Addended by: Obdulia Steier G on: 07/02/2024 07:38 PM   Modules accepted: Orders

## 2024-07-07 ENCOUNTER — Telehealth: Payer: Self-pay

## 2024-07-07 NOTE — Telephone Encounter (Signed)
 Received same paperwork and this has been given to CMA, Maire to be completed.

## 2024-07-20 ENCOUNTER — Ambulatory Visit: Admitting: Internal Medicine

## 2024-10-20 ENCOUNTER — Ambulatory Visit
